# Patient Record
Sex: Female | Born: 1940 | Race: Black or African American | Hispanic: No | Smoking: Former smoker
Health system: Southern US, Community
[De-identification: ages and names within clinical notes are randomized; demographics above are authoritative.]

## PROBLEM LIST (undated history)

## (undated) DIAGNOSIS — K219 Gastro-esophageal reflux disease without esophagitis: Secondary | ICD-10-CM

## (undated) DIAGNOSIS — M1990 Unspecified osteoarthritis, unspecified site: Secondary | ICD-10-CM

## (undated) DIAGNOSIS — I10 Essential (primary) hypertension: Secondary | ICD-10-CM

## (undated) DIAGNOSIS — A048 Other specified bacterial intestinal infections: Secondary | ICD-10-CM

## (undated) DIAGNOSIS — M8580 Other specified disorders of bone density and structure, unspecified site: Secondary | ICD-10-CM

## (undated) DIAGNOSIS — E785 Hyperlipidemia, unspecified: Secondary | ICD-10-CM

## (undated) DIAGNOSIS — K56609 Unspecified intestinal obstruction, unspecified as to partial versus complete obstruction: Secondary | ICD-10-CM

## (undated) DIAGNOSIS — E079 Disorder of thyroid, unspecified: Secondary | ICD-10-CM

## (undated) DIAGNOSIS — H269 Unspecified cataract: Secondary | ICD-10-CM

## (undated) DIAGNOSIS — Z923 Personal history of irradiation: Secondary | ICD-10-CM

## (undated) DIAGNOSIS — Z9109 Other allergy status, other than to drugs and biological substances: Secondary | ICD-10-CM

## (undated) DIAGNOSIS — L509 Urticaria, unspecified: Secondary | ICD-10-CM

## (undated) DIAGNOSIS — D509 Iron deficiency anemia, unspecified: Secondary | ICD-10-CM

## (undated) DIAGNOSIS — T7840XA Allergy, unspecified, initial encounter: Secondary | ICD-10-CM

## (undated) DIAGNOSIS — C801 Malignant (primary) neoplasm, unspecified: Secondary | ICD-10-CM

## (undated) DIAGNOSIS — H409 Unspecified glaucoma: Secondary | ICD-10-CM

## (undated) HISTORY — DX: Unspecified cataract: H26.9

## (undated) HISTORY — PX: EYE SURGERY: SHX253

## (undated) HISTORY — DX: Allergy, unspecified, initial encounter: T78.40XA

## (undated) HISTORY — DX: Unspecified osteoarthritis, unspecified site: M19.90

## (undated) HISTORY — DX: Iron deficiency anemia, unspecified: D50.9

## (undated) HISTORY — DX: Other specified disorders of bone density and structure, unspecified site: M85.80

## (undated) HISTORY — DX: Disorder of thyroid, unspecified: E07.9

## (undated) HISTORY — DX: Urticaria, unspecified: L50.9

## (undated) HISTORY — PX: SMALL INTESTINE SURGERY: SHX150

## (undated) HISTORY — PX: NASAL SINUS SURGERY: SHX719

## (undated) HISTORY — PX: KNEE SURGERY: SHX244

## (undated) HISTORY — PX: COLON SURGERY: SHX602

## (undated) HISTORY — DX: Unspecified glaucoma: H40.9

## (undated) HISTORY — PX: BREAST BIOPSY: SHX20

## (undated) HISTORY — PX: ABDOMINAL HYSTERECTOMY: SHX81

## (undated) HISTORY — PX: THYROID SURGERY: SHX805

---
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
## 1997-08-23 ENCOUNTER — Emergency Department (HOSPITAL_COMMUNITY): Admission: EM | Admit: 1997-08-23 | Discharge: 1997-08-23 | Payer: Self-pay | Admitting: Emergency Medicine

## 1997-09-29 ENCOUNTER — Other Ambulatory Visit: Admission: RE | Admit: 1997-09-29 | Discharge: 1997-09-29 | Payer: Self-pay | Admitting: Obstetrics & Gynecology

## 1997-10-21 ENCOUNTER — Other Ambulatory Visit: Admission: RE | Admit: 1997-10-21 | Discharge: 1997-10-21 | Payer: Self-pay | Admitting: Internal Medicine

## 1997-12-15 ENCOUNTER — Other Ambulatory Visit: Admission: RE | Admit: 1997-12-15 | Discharge: 1997-12-15 | Payer: Self-pay | Admitting: Internal Medicine

## 1998-01-06 ENCOUNTER — Ambulatory Visit (HOSPITAL_COMMUNITY): Admission: RE | Admit: 1998-01-06 | Discharge: 1998-01-06 | Payer: Self-pay | Admitting: Internal Medicine

## 1998-02-08 ENCOUNTER — Encounter (HOSPITAL_BASED_OUTPATIENT_CLINIC_OR_DEPARTMENT_OTHER): Payer: Self-pay | Admitting: General Surgery

## 1998-02-10 ENCOUNTER — Ambulatory Visit (HOSPITAL_COMMUNITY): Admission: RE | Admit: 1998-02-10 | Discharge: 1998-02-11 | Payer: Self-pay | Admitting: General Surgery

## 1998-02-19 ENCOUNTER — Emergency Department (HOSPITAL_COMMUNITY): Admission: EM | Admit: 1998-02-19 | Discharge: 1998-02-19 | Payer: Self-pay | Admitting: Emergency Medicine

## 1998-03-10 ENCOUNTER — Other Ambulatory Visit: Admission: RE | Admit: 1998-03-10 | Discharge: 1998-03-10 | Payer: Self-pay | Admitting: Obstetrics & Gynecology

## 1998-03-23 ENCOUNTER — Encounter: Payer: Self-pay | Admitting: Obstetrics & Gynecology

## 1998-03-23 ENCOUNTER — Ambulatory Visit (HOSPITAL_COMMUNITY): Admission: RE | Admit: 1998-03-23 | Discharge: 1998-03-23 | Payer: Self-pay | Admitting: Obstetrics & Gynecology

## 1999-03-17 ENCOUNTER — Other Ambulatory Visit: Admission: RE | Admit: 1999-03-17 | Discharge: 1999-03-17 | Payer: Self-pay | Admitting: Obstetrics & Gynecology

## 1999-04-06 ENCOUNTER — Ambulatory Visit (HOSPITAL_COMMUNITY): Admission: RE | Admit: 1999-04-06 | Discharge: 1999-04-06 | Payer: Self-pay | Admitting: Obstetrics & Gynecology

## 1999-04-06 ENCOUNTER — Encounter: Payer: Self-pay | Admitting: Obstetrics & Gynecology

## 1999-07-19 ENCOUNTER — Ambulatory Visit (HOSPITAL_COMMUNITY): Admission: RE | Admit: 1999-07-19 | Discharge: 1999-07-19 | Payer: Self-pay | Admitting: Internal Medicine

## 2000-04-26 ENCOUNTER — Other Ambulatory Visit: Admission: RE | Admit: 2000-04-26 | Discharge: 2000-04-26 | Payer: Self-pay | Admitting: Obstetrics and Gynecology

## 2000-05-02 ENCOUNTER — Ambulatory Visit (HOSPITAL_COMMUNITY): Admission: RE | Admit: 2000-05-02 | Discharge: 2000-05-02 | Payer: Self-pay | Admitting: Obstetrics and Gynecology

## 2000-05-02 ENCOUNTER — Encounter: Payer: Self-pay | Admitting: Internal Medicine

## 2000-06-25 ENCOUNTER — Ambulatory Visit (HOSPITAL_COMMUNITY): Admission: RE | Admit: 2000-06-25 | Discharge: 2000-06-25 | Payer: Self-pay | Admitting: Internal Medicine

## 2000-06-25 ENCOUNTER — Encounter: Payer: Self-pay | Admitting: Internal Medicine

## 2001-04-30 ENCOUNTER — Other Ambulatory Visit: Admission: RE | Admit: 2001-04-30 | Discharge: 2001-04-30 | Payer: Self-pay | Admitting: Obstetrics and Gynecology

## 2001-05-05 ENCOUNTER — Encounter: Payer: Self-pay | Admitting: Obstetrics and Gynecology

## 2001-05-05 ENCOUNTER — Ambulatory Visit (HOSPITAL_COMMUNITY): Admission: RE | Admit: 2001-05-05 | Discharge: 2001-05-05 | Payer: Self-pay | Admitting: Obstetrics and Gynecology

## 2001-07-04 ENCOUNTER — Emergency Department (HOSPITAL_COMMUNITY): Admission: EM | Admit: 2001-07-04 | Discharge: 2001-07-04 | Payer: Self-pay | Admitting: Emergency Medicine

## 2001-08-06 ENCOUNTER — Encounter (INDEPENDENT_AMBULATORY_CARE_PROVIDER_SITE_OTHER): Payer: Self-pay

## 2001-08-06 ENCOUNTER — Observation Stay (HOSPITAL_COMMUNITY): Admission: RE | Admit: 2001-08-06 | Discharge: 2001-08-07 | Payer: Self-pay | Admitting: Obstetrics and Gynecology

## 2001-09-17 ENCOUNTER — Ambulatory Visit (HOSPITAL_COMMUNITY): Admission: RE | Admit: 2001-09-17 | Discharge: 2001-09-17 | Payer: Self-pay | Admitting: Obstetrics and Gynecology

## 2001-09-17 ENCOUNTER — Encounter: Payer: Self-pay | Admitting: Obstetrics and Gynecology

## 2002-03-03 ENCOUNTER — Encounter: Payer: Self-pay | Admitting: Internal Medicine

## 2002-03-03 ENCOUNTER — Ambulatory Visit (HOSPITAL_COMMUNITY): Admission: RE | Admit: 2002-03-03 | Discharge: 2002-03-03 | Payer: Self-pay | Admitting: Internal Medicine

## 2002-04-23 ENCOUNTER — Ambulatory Visit (HOSPITAL_COMMUNITY): Admission: RE | Admit: 2002-04-23 | Discharge: 2002-04-23 | Payer: Self-pay | Admitting: Internal Medicine

## 2002-05-06 ENCOUNTER — Encounter: Payer: Self-pay | Admitting: Obstetrics and Gynecology

## 2002-05-06 ENCOUNTER — Ambulatory Visit (HOSPITAL_COMMUNITY): Admission: RE | Admit: 2002-05-06 | Discharge: 2002-05-06 | Payer: Self-pay | Admitting: Obstetrics and Gynecology

## 2002-05-22 ENCOUNTER — Emergency Department (HOSPITAL_COMMUNITY): Admission: EM | Admit: 2002-05-22 | Discharge: 2002-05-22 | Payer: Self-pay | Admitting: Emergency Medicine

## 2002-06-11 ENCOUNTER — Other Ambulatory Visit: Admission: RE | Admit: 2002-06-11 | Discharge: 2002-06-11 | Payer: Self-pay | Admitting: Obstetrics and Gynecology

## 2002-07-14 ENCOUNTER — Emergency Department (HOSPITAL_COMMUNITY): Admission: EM | Admit: 2002-07-14 | Discharge: 2002-07-14 | Payer: Self-pay | Admitting: Emergency Medicine

## 2002-07-14 ENCOUNTER — Encounter: Payer: Self-pay | Admitting: Emergency Medicine

## 2003-05-17 ENCOUNTER — Ambulatory Visit (HOSPITAL_COMMUNITY): Admission: RE | Admit: 2003-05-17 | Discharge: 2003-05-17 | Payer: Self-pay | Admitting: Internal Medicine

## 2003-05-26 ENCOUNTER — Emergency Department (HOSPITAL_COMMUNITY): Admission: EM | Admit: 2003-05-26 | Discharge: 2003-05-26 | Payer: Self-pay | Admitting: Emergency Medicine

## 2003-06-02 ENCOUNTER — Emergency Department (HOSPITAL_COMMUNITY): Admission: EM | Admit: 2003-06-02 | Discharge: 2003-06-02 | Payer: Self-pay | Admitting: Emergency Medicine

## 2004-05-17 ENCOUNTER — Ambulatory Visit (HOSPITAL_COMMUNITY): Admission: RE | Admit: 2004-05-17 | Discharge: 2004-05-17 | Payer: Self-pay | Admitting: Obstetrics and Gynecology

## 2004-06-30 ENCOUNTER — Emergency Department (HOSPITAL_COMMUNITY): Admission: EM | Admit: 2004-06-30 | Discharge: 2004-06-30 | Payer: Self-pay | Admitting: Emergency Medicine

## 2004-06-30 IMAGING — CR DG CHEST 2V
2 series · 2 of 2 positions shown · non-contrast
Comparison: none

CLINICAL DATA: Cough and cold symptoms.
 CHEST-TWO VIEW:
 No comparison.  The cardiomediastinal contours are normal.  The lungs are clear.  There is no pleural effusion or pneumothorax.  Osseous structures appear unremarkable.

[view not recorded (1 of 2)]
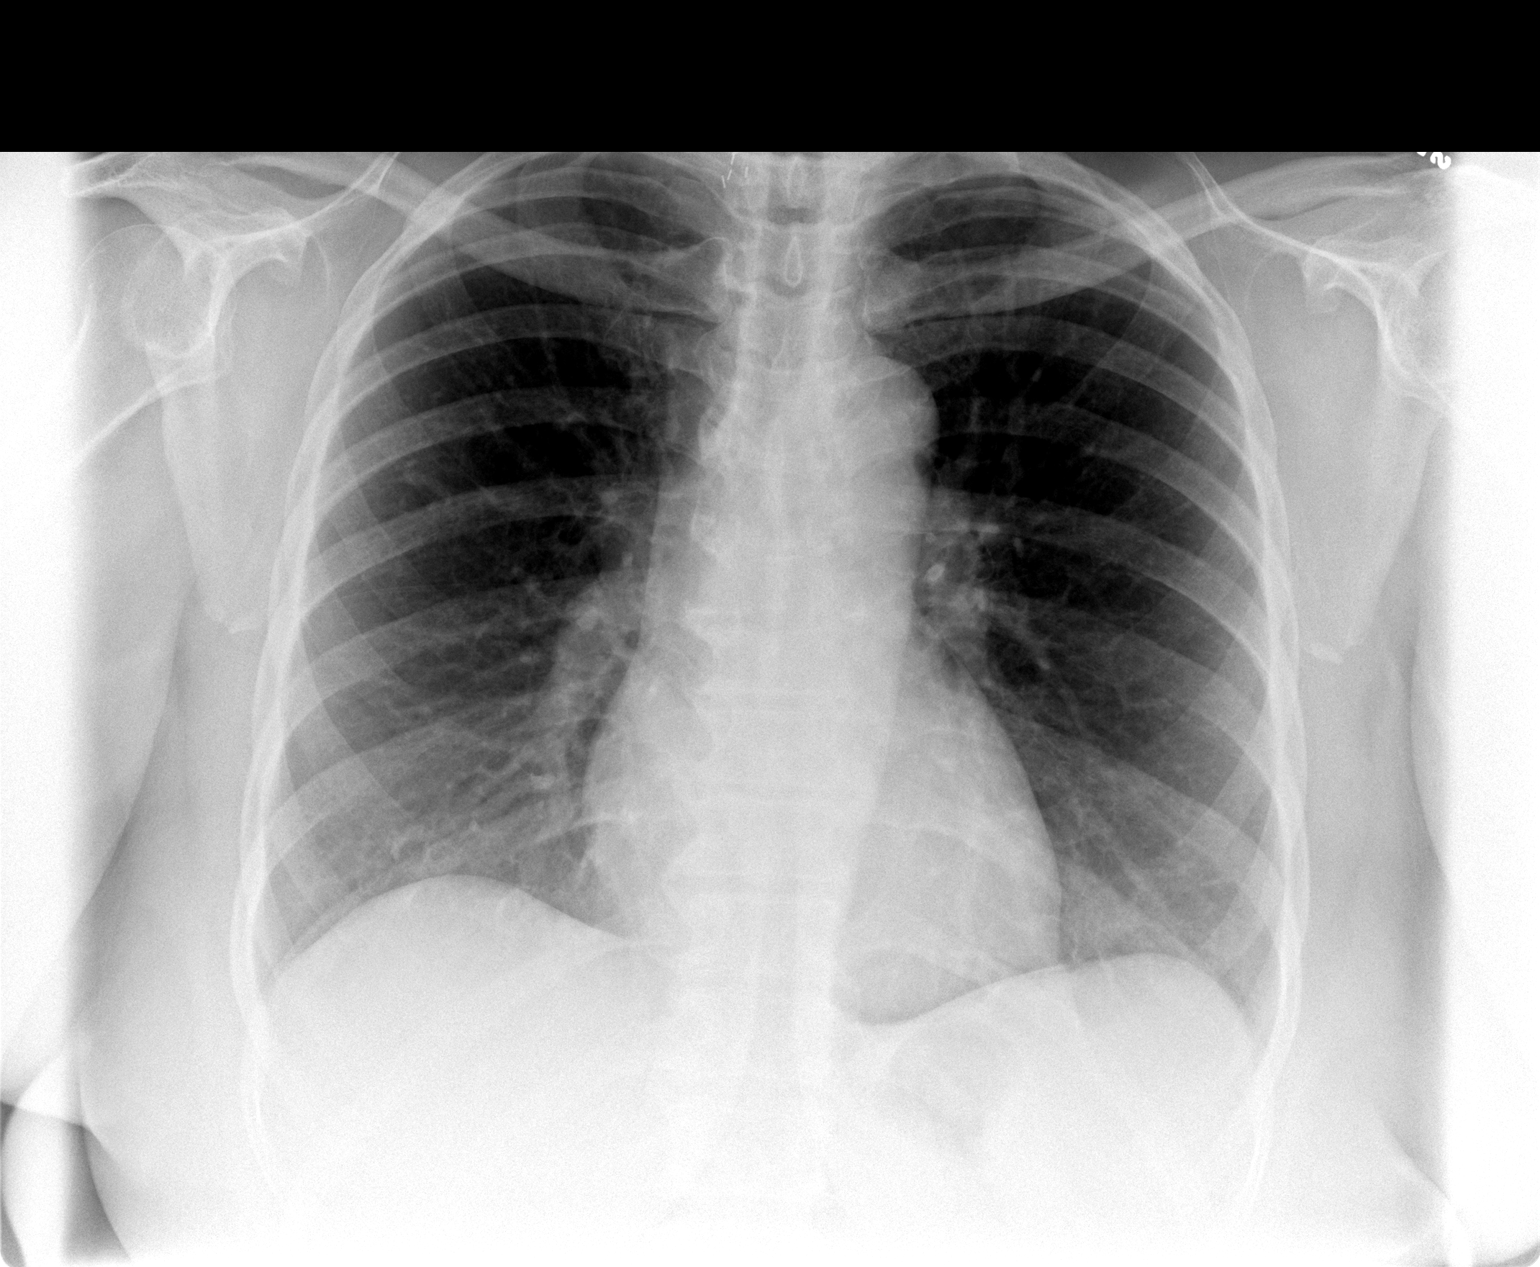

[view not recorded (2 of 2)]
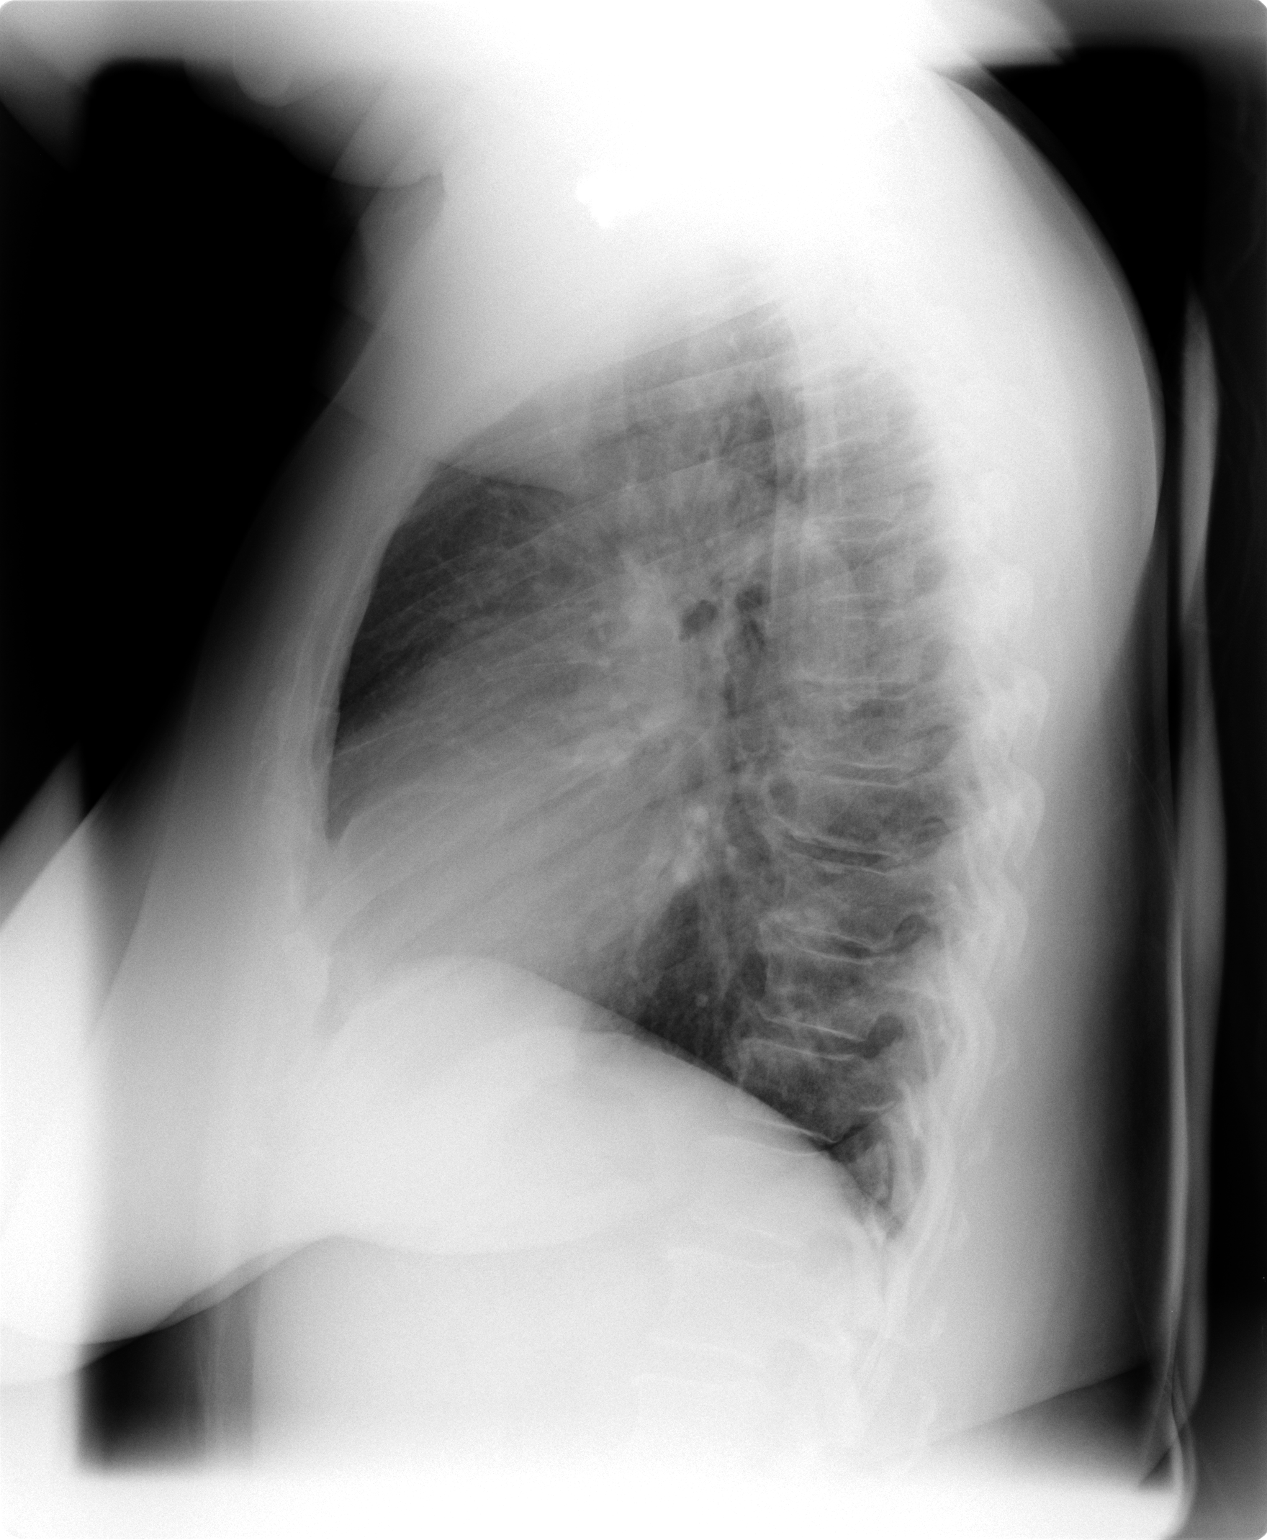

[2 of 2 positions shown; findings below may reference images not displayed]

IMPRESSION: No active cardiopulmonary process demonstrated.

## 2004-08-30 ENCOUNTER — Inpatient Hospital Stay (HOSPITAL_COMMUNITY): Admission: AD | Admit: 2004-08-30 | Discharge: 2004-08-30 | Payer: Self-pay | Admitting: Internal Medicine

## 2004-08-30 IMAGING — CR DG CHEST 2V
2 series · 2 of 2 positions shown · non-contrast
Comparison: Previous exam on [DATE].

CLINICAL DATA: Persistent cough with congestion for three weeks.
 CHEST - 2 VIEW:

[view not recorded (1 of 2)]
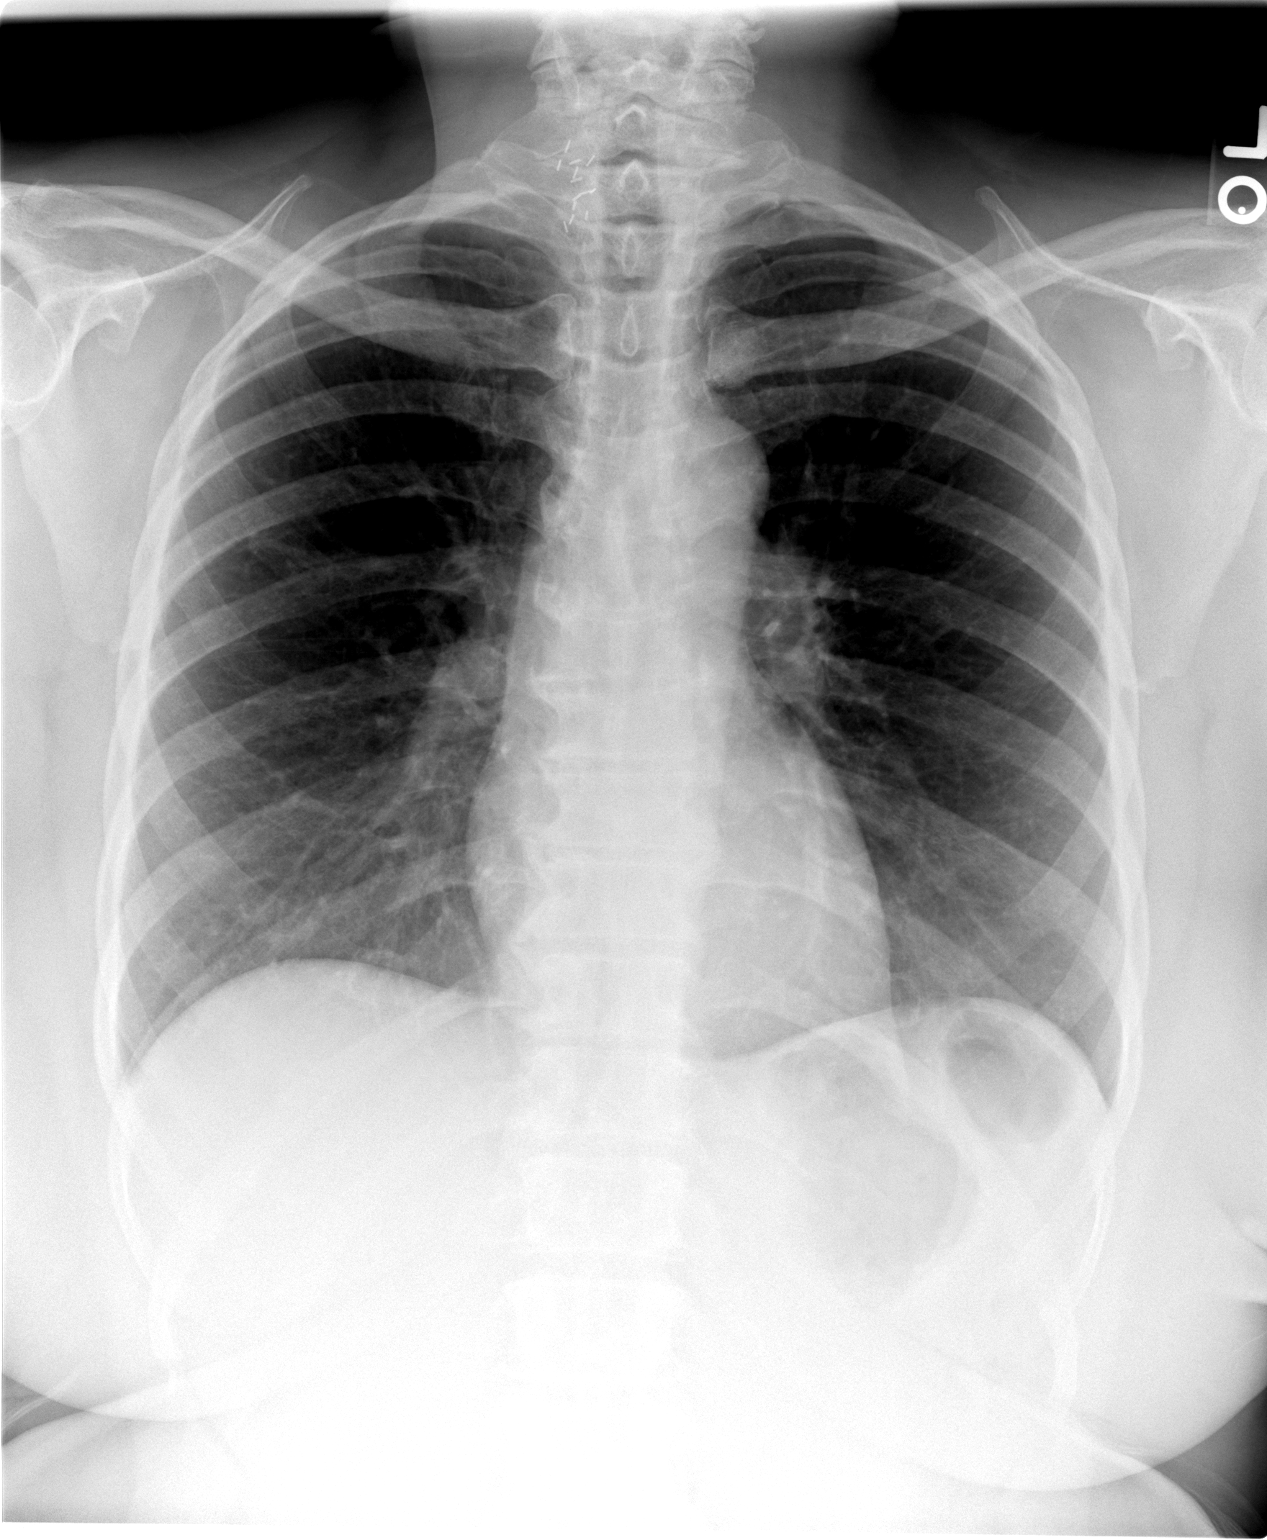

[view not recorded (2 of 2)]
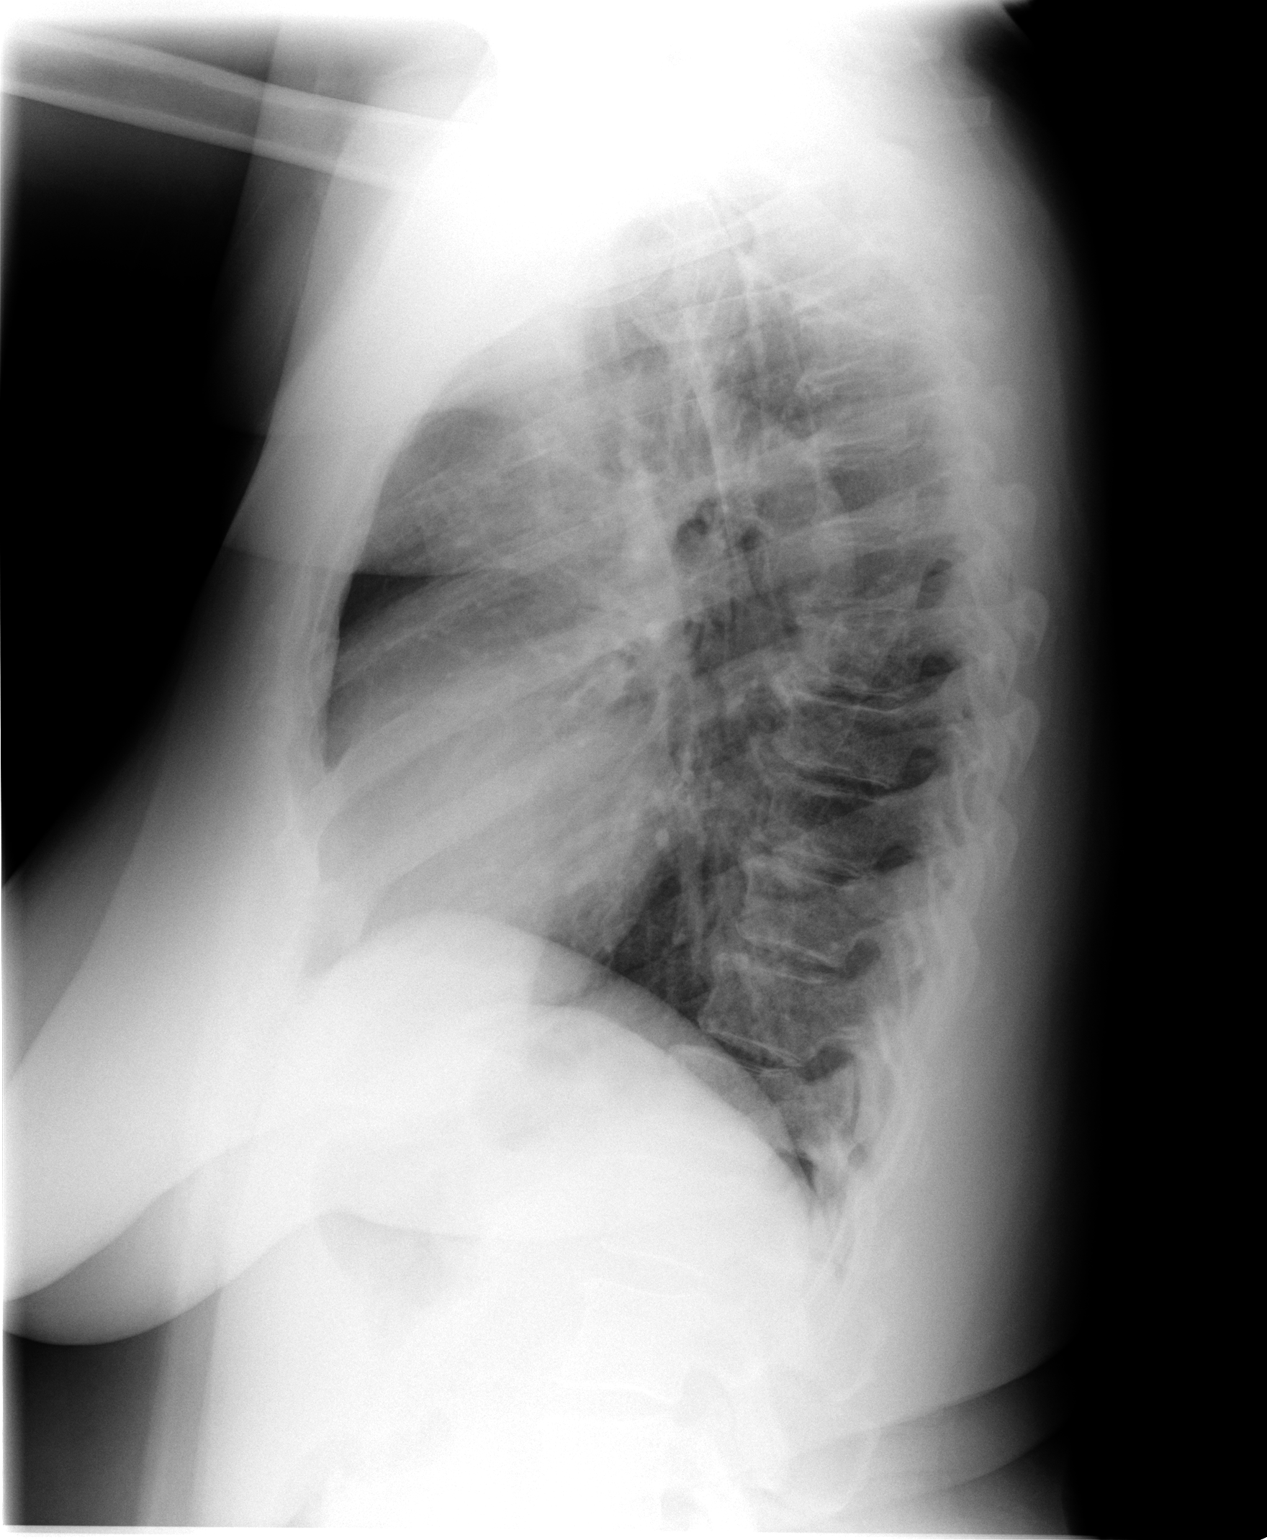

[2 of 2 positions shown; findings below may reference images not displayed]

Heart and mediastinal contours are within normal limits.  The lung fields are clear with no evidence for focal infiltrate or congestive failure.  Bony structures demonstrate degenerative changes of the thoracic spine with anterior osteophytosis at multiple levels and this finding is stable.
IMPRESSION: No acute cardiopulmonary disease.

## 2005-02-12 ENCOUNTER — Emergency Department (HOSPITAL_COMMUNITY): Admission: EM | Admit: 2005-02-12 | Discharge: 2005-02-12 | Payer: Self-pay | Admitting: Emergency Medicine

## 2005-03-26 IMAGING — CR DG CHEST 2V
2 series · 2 of 2 positions shown · non-contrast
Comparison: [DATE].

CLINICAL DATA: Pansinusitis.  Hypertension.  Asthma.  Wheezing.  Cough.  Preadmission chest.
 CHEST - 2 VIEWS:

[view not recorded (1 of 2)]
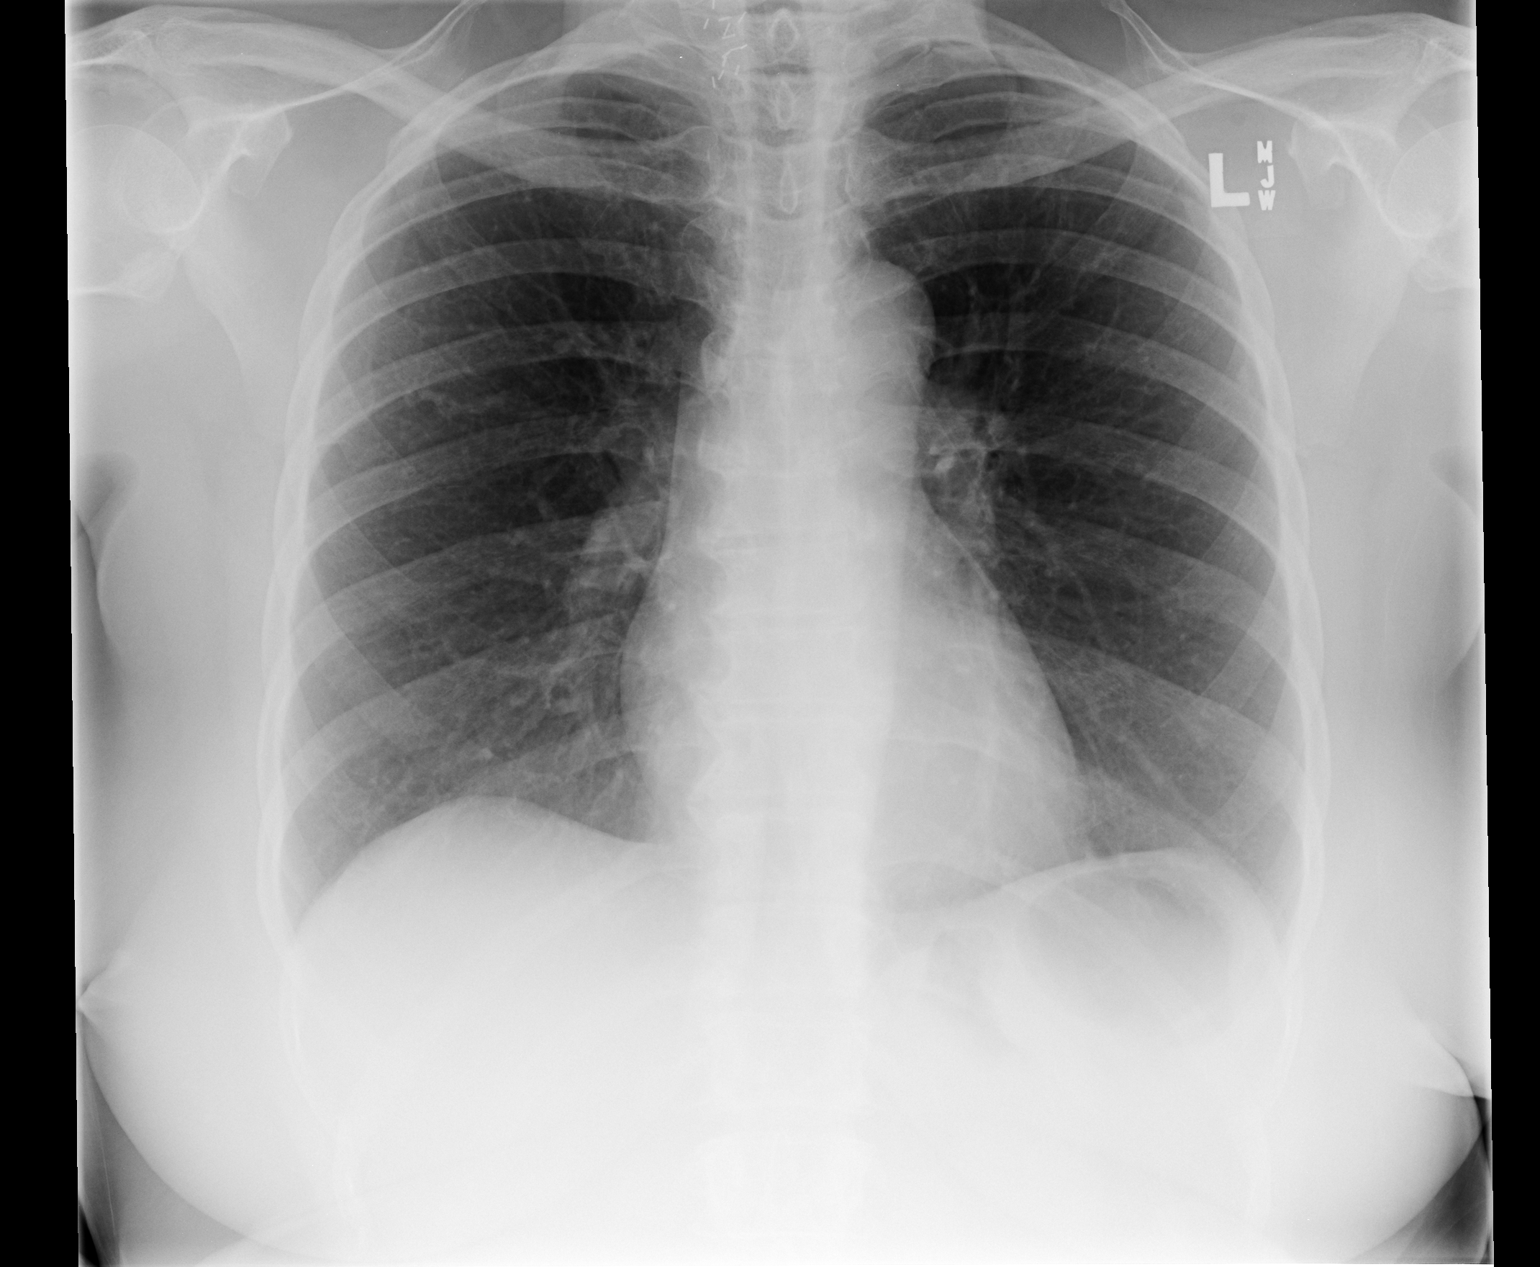

[view not recorded (2 of 2)]
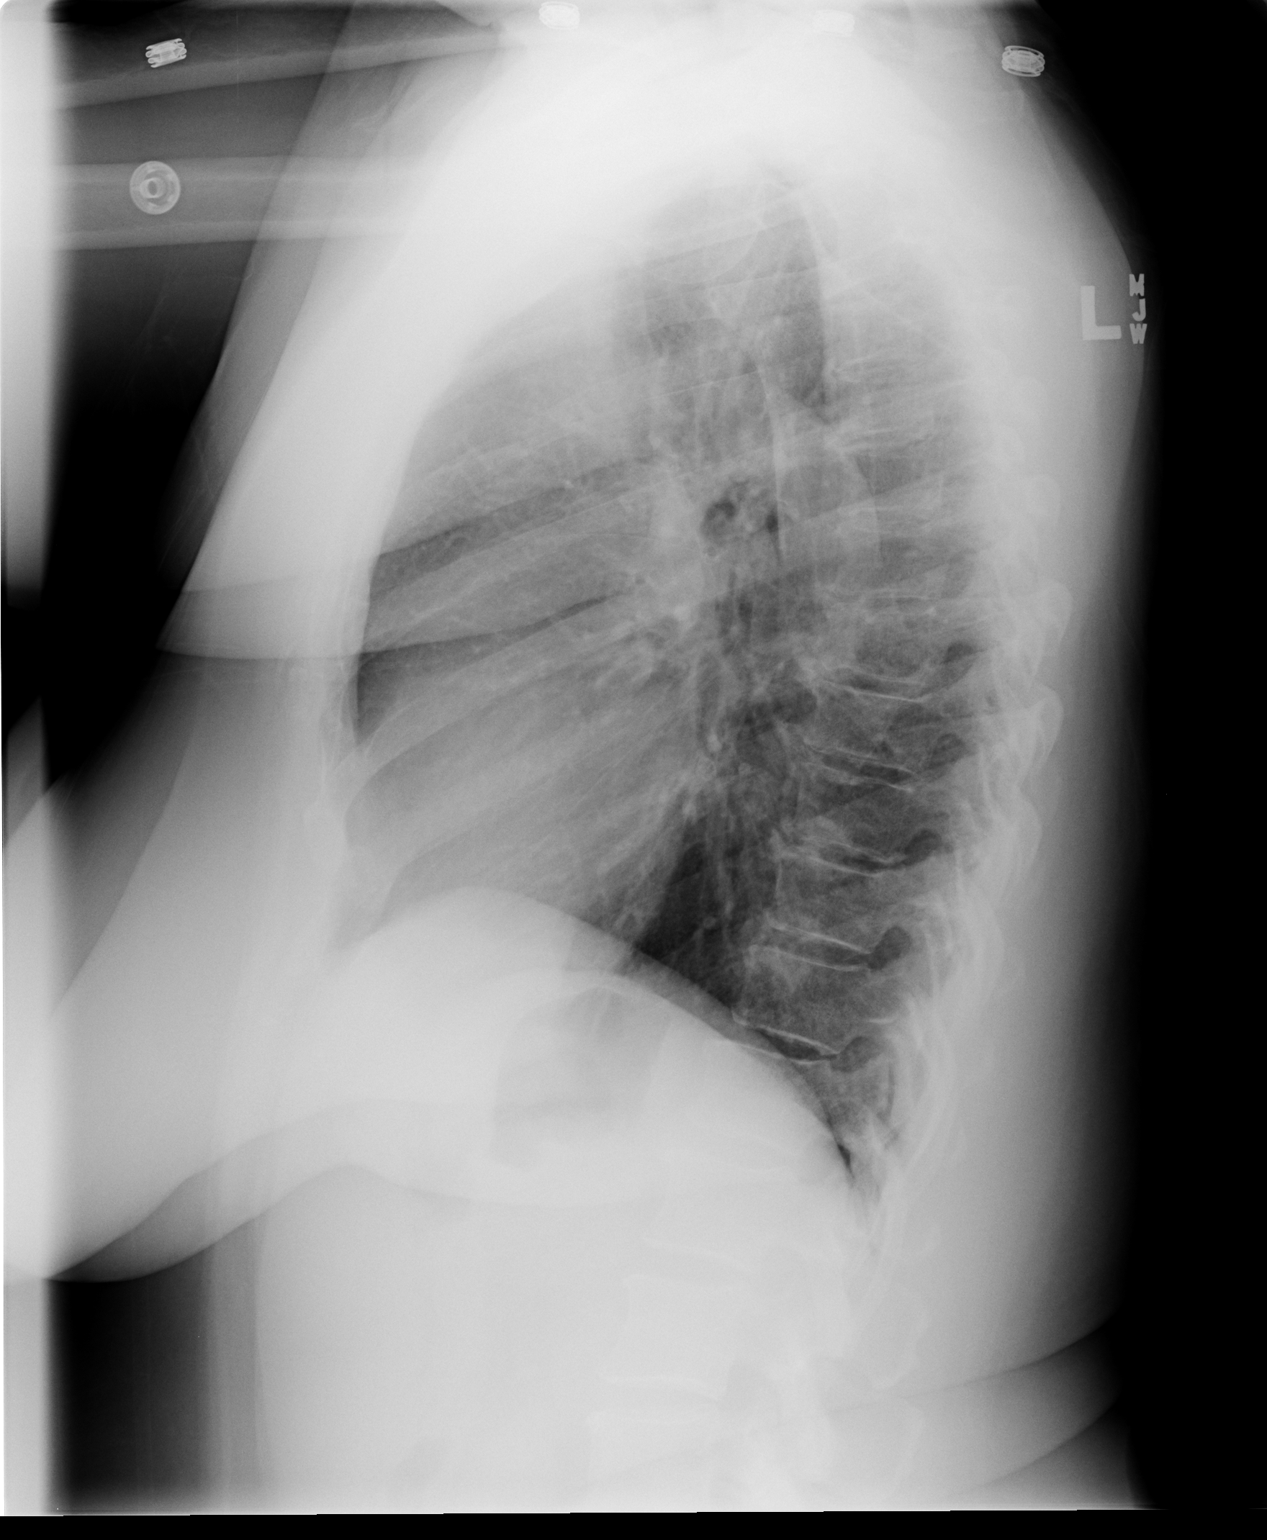

[2 of 2 positions shown; findings below may reference images not displayed]

FINDINGS: Diffuse accentuation of the peribronchial markings is compatible with chronic changes.  No active pulmonary process.  The cardiomediastinal silhouette is normal.  Symmetrical hila.  Cluster of metallic surgical clips in the right lower cervical region.
IMPRESSION: Chronic peribronchitic markings.  No active chest process.

## 2005-03-28 ENCOUNTER — Ambulatory Visit (HOSPITAL_COMMUNITY): Admission: RE | Admit: 2005-03-28 | Discharge: 2005-03-28 | Payer: Self-pay | Admitting: Otolaryngology

## 2005-03-28 ENCOUNTER — Encounter (INDEPENDENT_AMBULATORY_CARE_PROVIDER_SITE_OTHER): Payer: Self-pay | Admitting: Specialist

## 2005-05-25 ENCOUNTER — Ambulatory Visit (HOSPITAL_COMMUNITY): Admission: RE | Admit: 2005-05-25 | Discharge: 2005-05-25 | Payer: Self-pay | Admitting: Internal Medicine

## 2005-12-17 ENCOUNTER — Emergency Department (HOSPITAL_COMMUNITY): Admission: EM | Admit: 2005-12-17 | Discharge: 2005-12-17 | Payer: Self-pay | Admitting: Emergency Medicine

## 2005-12-17 IMAGING — CR DG ABDOMEN ACUTE W/ 1V CHEST
4 series · 4 of 4 positions shown · non-contrast
Comparison: [DATE]

CLINICAL DATA: Abdominal pain. Short of breath. Weakness.

[view not recorded (1 of 4)]
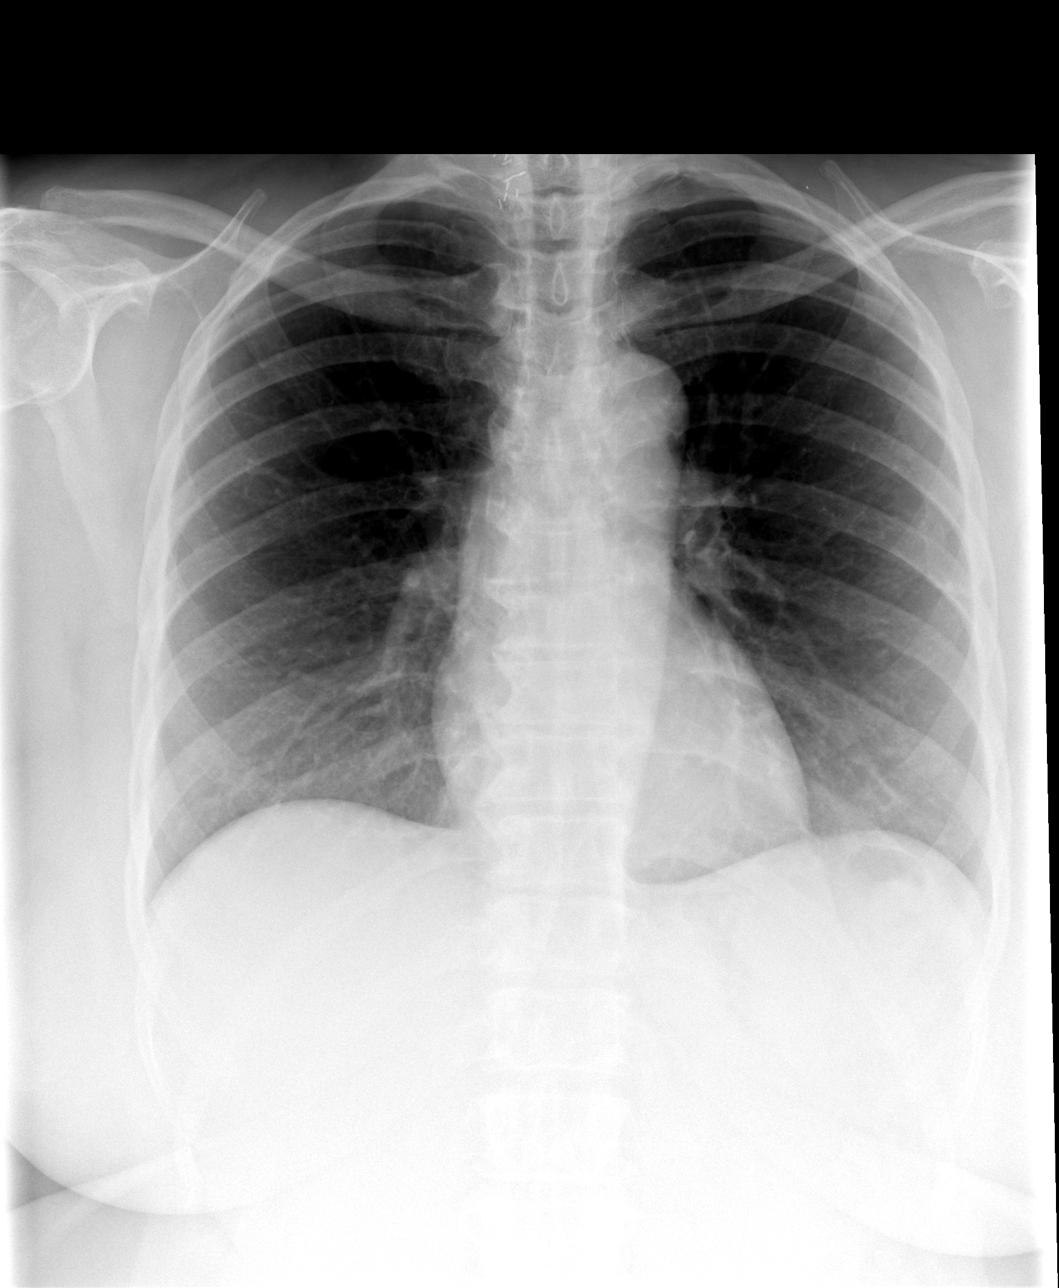

[view not recorded (2 of 4)]
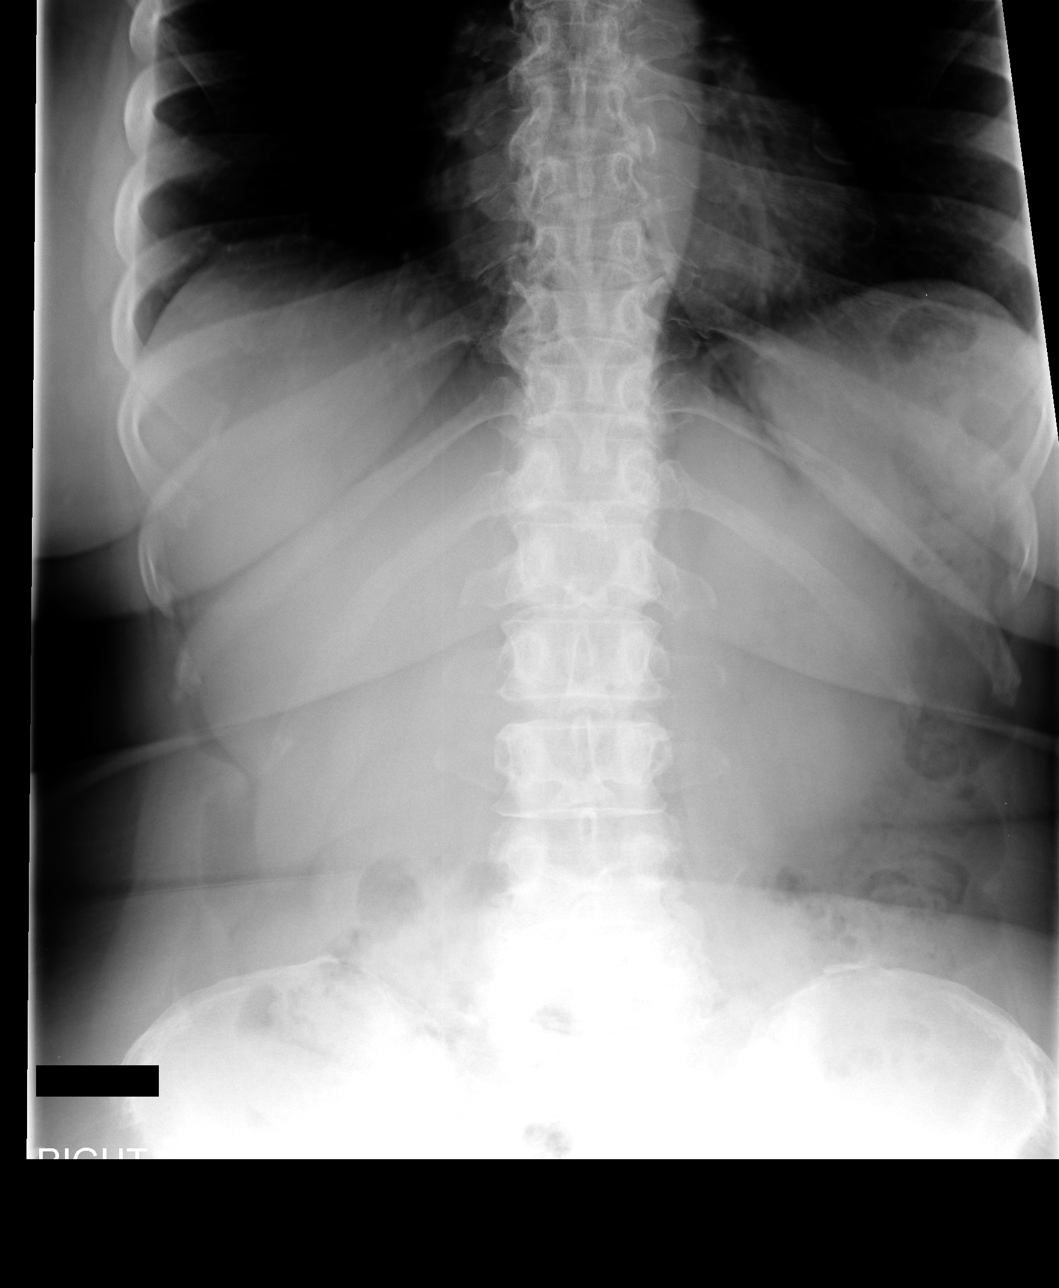

[view not recorded (3 of 4)]
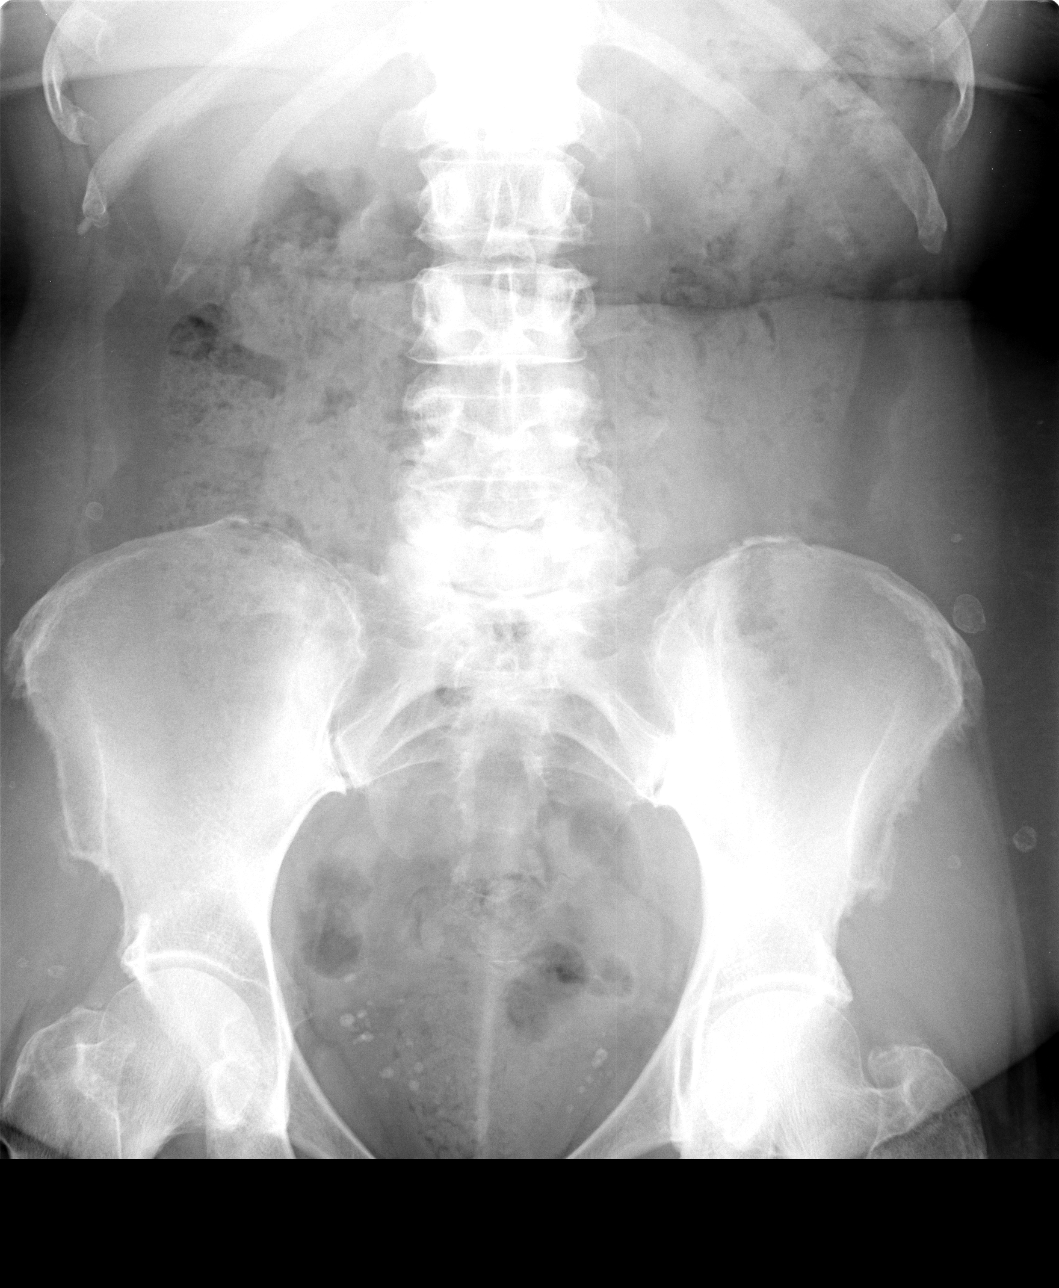

[view not recorded (4 of 4)]
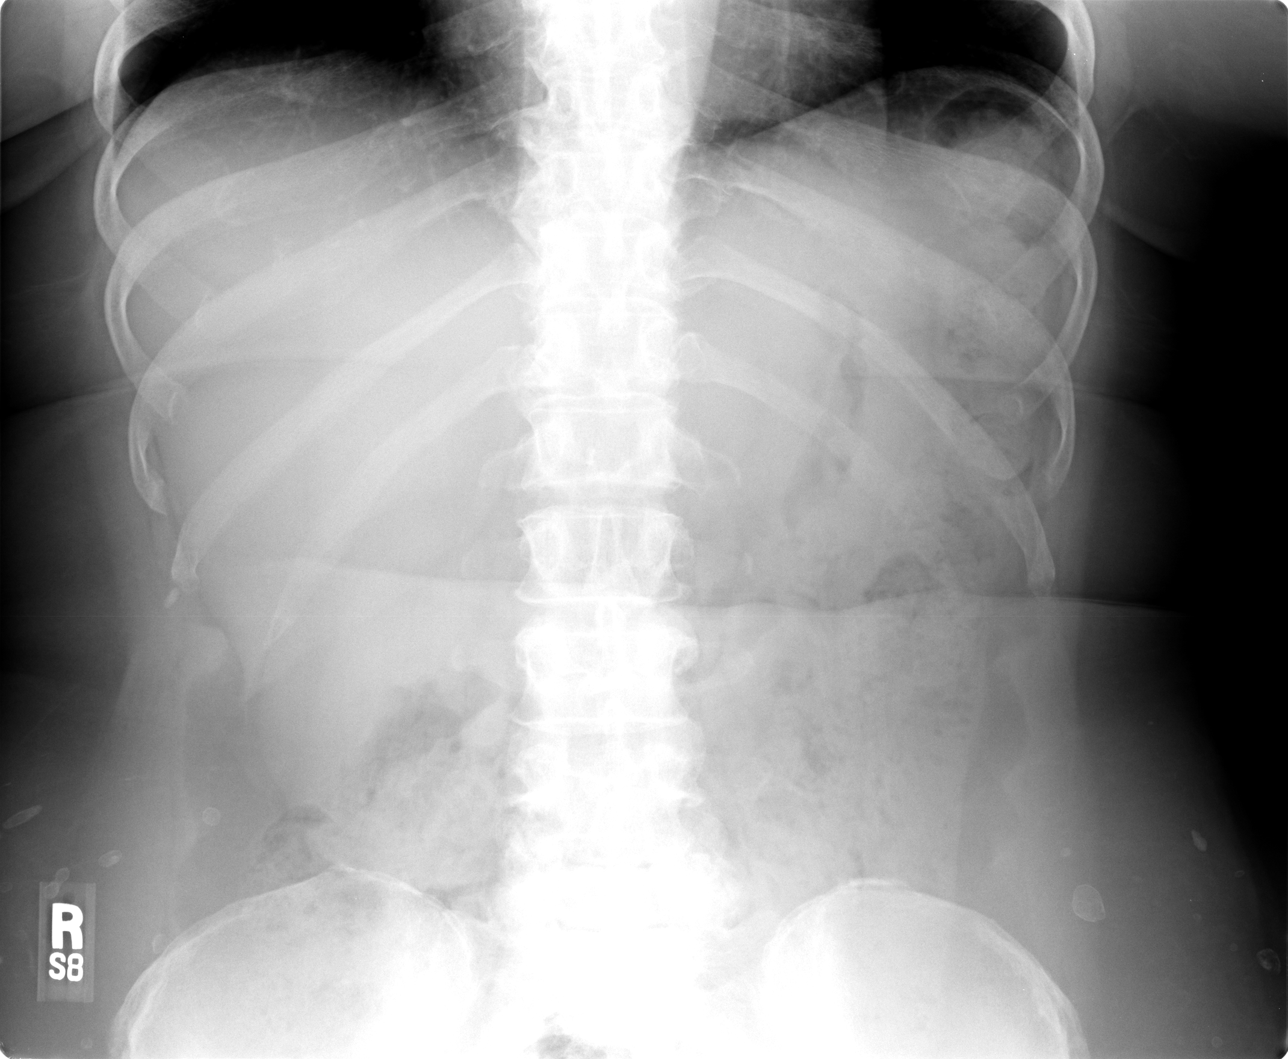

[4 of 4 positions shown; findings below may reference images not displayed]

ABDOMEN SERIES - 2 VIEW & CHEST - 1 VIEW:

Lungs clear. Heart size is within normal limits. Surgical clips in the right
neck suggest prior right thyroidectomy.

No intraperitoneal free air. Prominent stool noted along the course of the
colon. Degenerative changes characterize the lower lumbar spine.
IMPRESSION: No acute cardiopulmonary process.

Prominent stool raises the question of constipation.

## 2005-12-17 IMAGING — CT CT PELVIS W/ CM
2 of 5 series · 17 of 46 positions shown, 19 images · IV contrast (omnipaque)
Comparison: None.

CLINICAL DATA: Abdominal pain. 
 ABDOMEN CT WITH CONTRAST ? [DATE]:
TECHNIQUE: Multidetector CT imaging of the abdomen was performed following the standard protocol during bolus administration of intravenous contrast. 
 Contrast:  125 cc Omnipaque 300 IV.
TECHNIQUE: Multidetector CT imaging of the pelvis was performed following the standard protocol during bolus administration of intravenous contrast.

[Series 2: abd_pel 5.0 b40s st · axial · 0.63mm/px · z∈[-482,-88]mm · 14 of 89 slices shown, 16 images]
[im 5/89  soft-tissue]
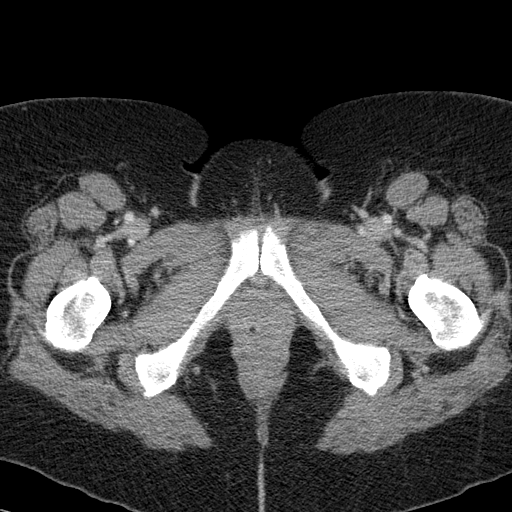
[im 5/89  bone]
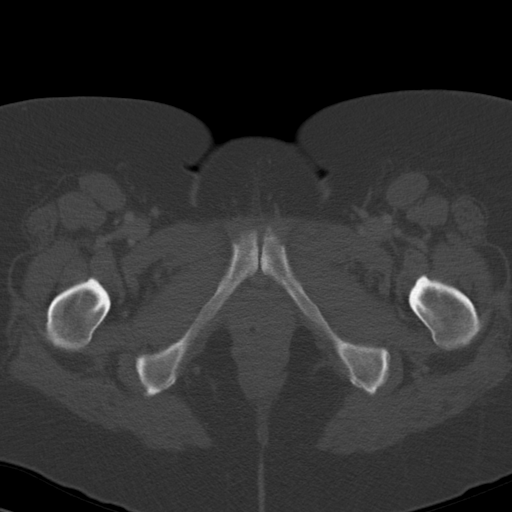
[im 14/89  soft-tissue]
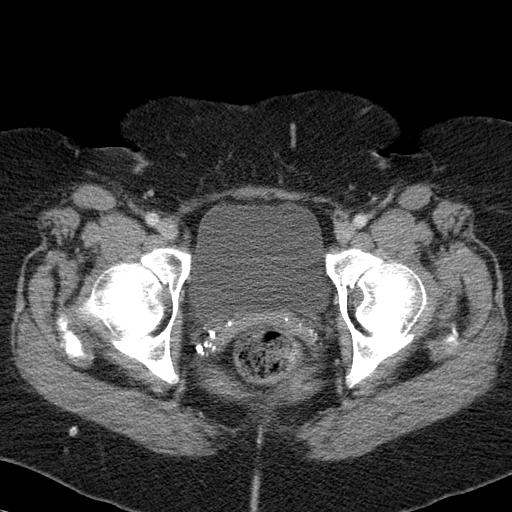
[im 18/89  soft-tissue]
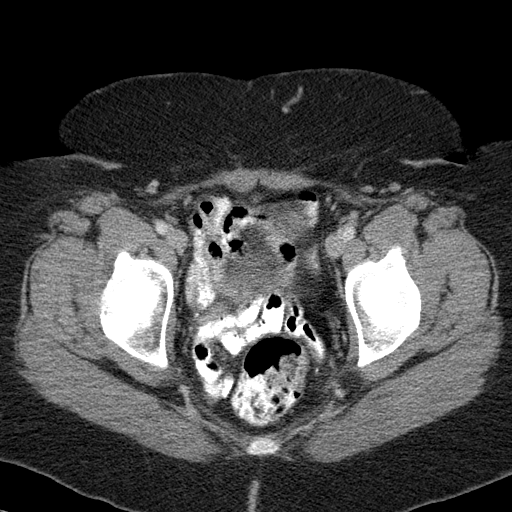
[im 23/89  soft-tissue]
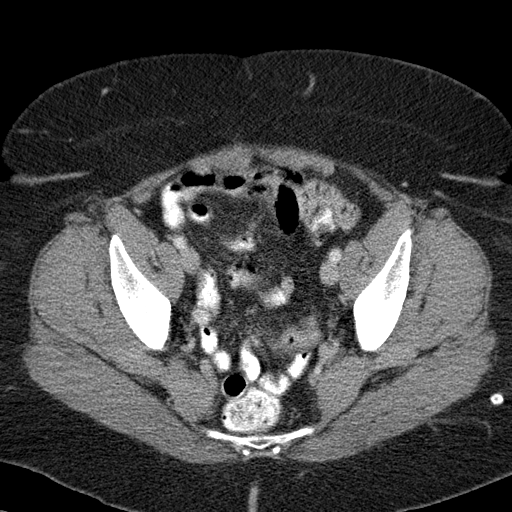
[im 31/89  soft-tissue]
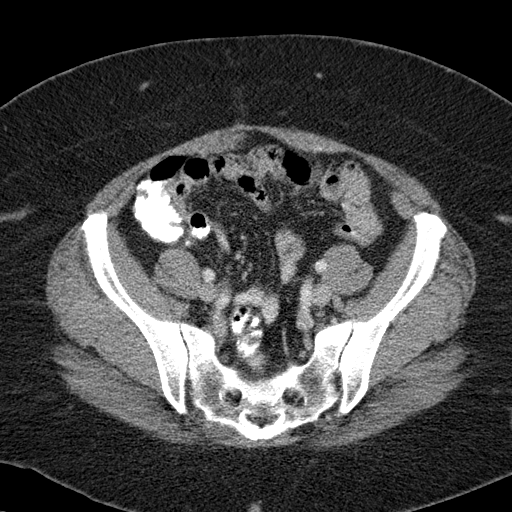
[im 36/89  soft-tissue]
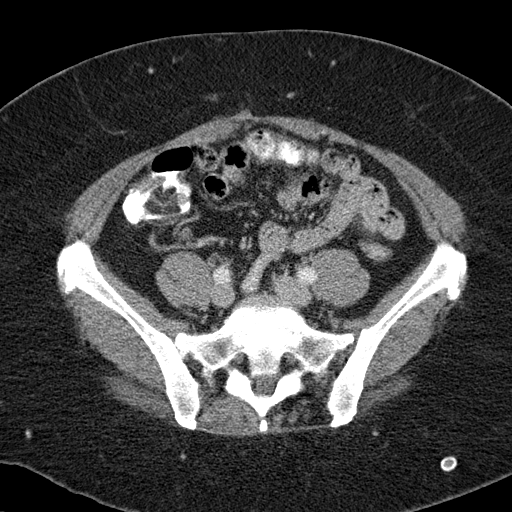
[im 40/89  soft-tissue]
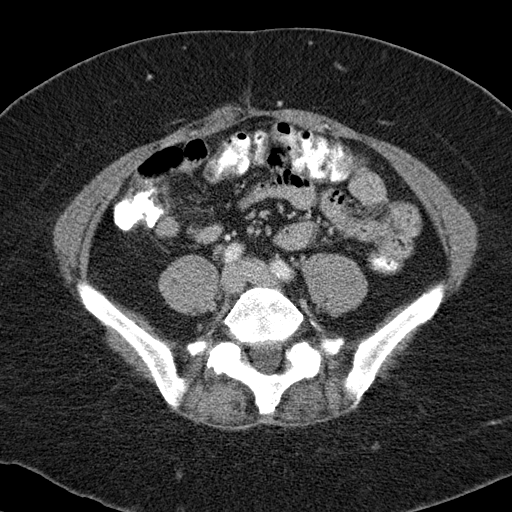
[im 49/89  soft-tissue]
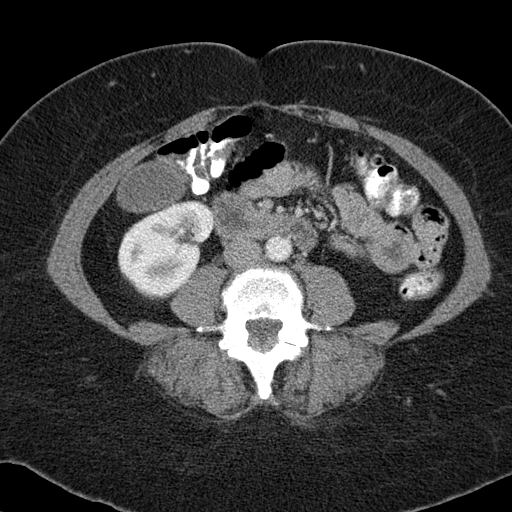
[im 53/89  soft-tissue]
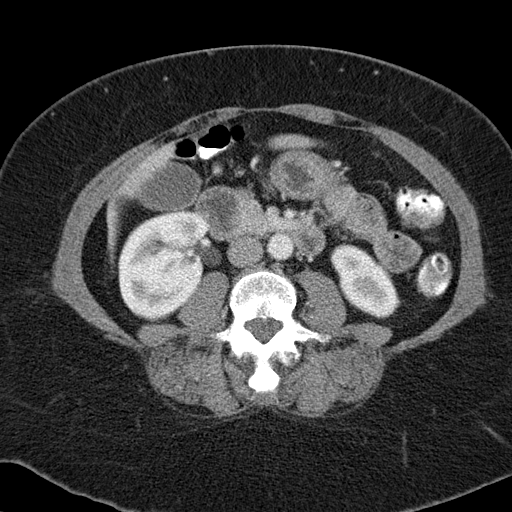
[im 53/89  bone]
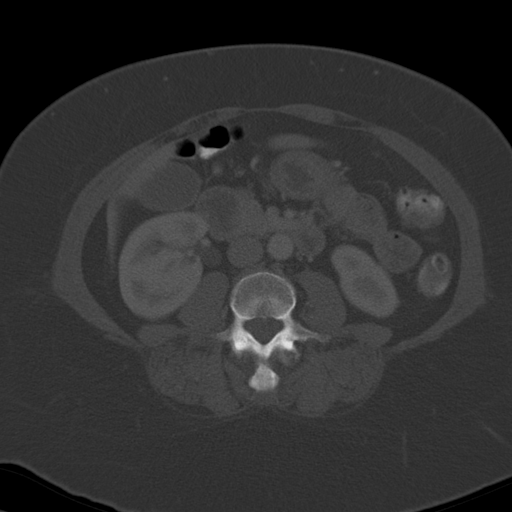
[im 58/89  soft-tissue]
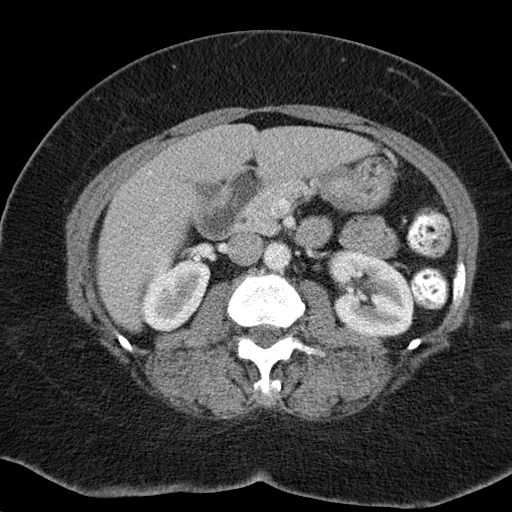
[im 67/89  soft-tissue]
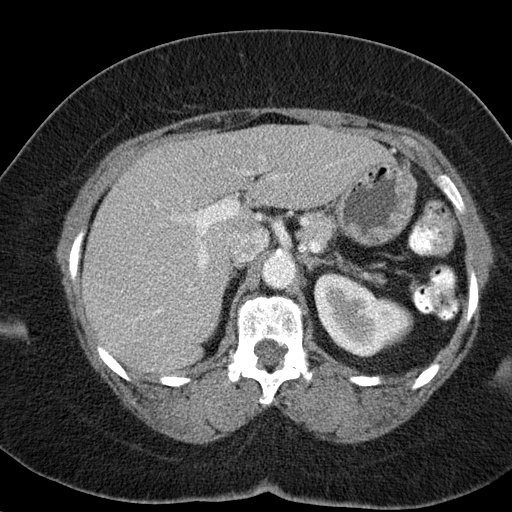
[im 71/89  soft-tissue]
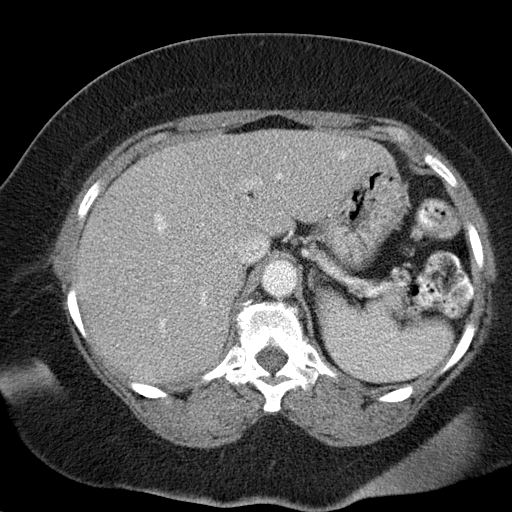
[im 75/89  soft-tissue]
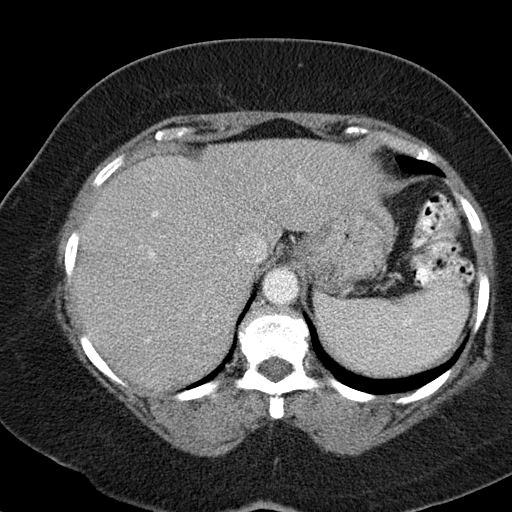
[im 84/89  soft-tissue]
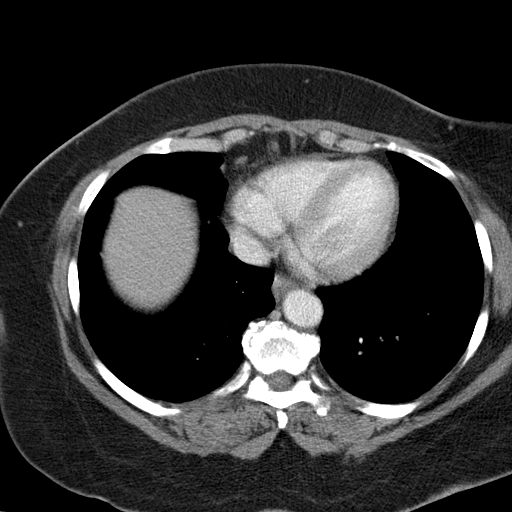

[Series 602: coronal · coronal · 0.89mm/px · 3 of 39 slices shown]
[im 13/39  soft-tissue]
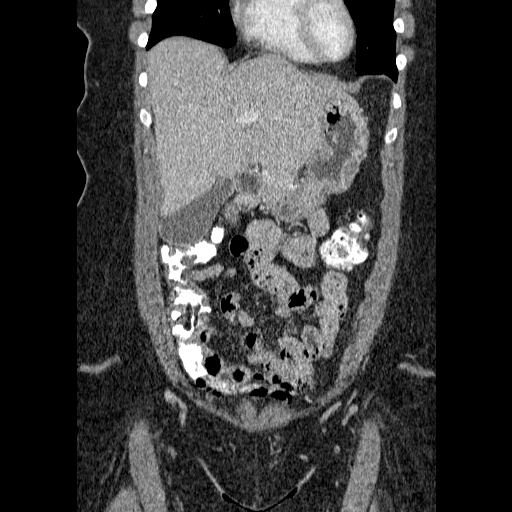
[im 17/39  soft-tissue]
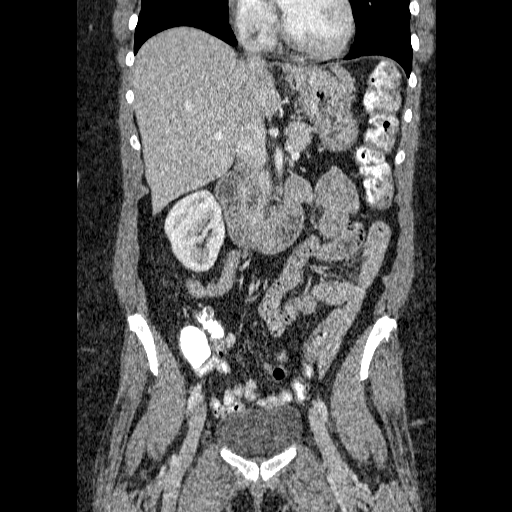
[im 22/39  soft-tissue]
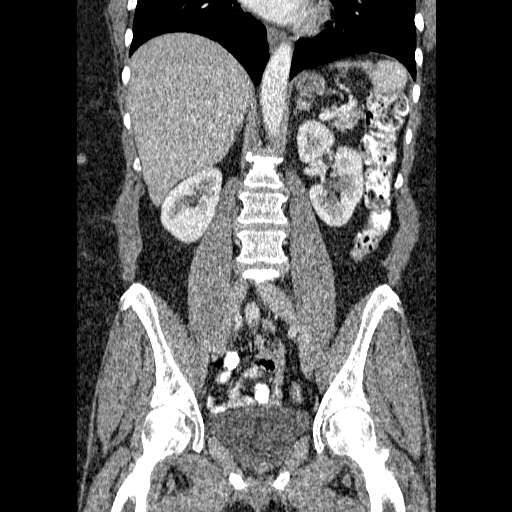

[17 of 46 positions shown; findings below may reference images not displayed]

FINDINGS: The liver, gallbladder, adrenal glands, kidneys, spleen, pancreas, stomach, and bowel are unremarkable.  No pathologically enlarged lymph nodes.  No free fluid.
IMPRESSION: No acute findings.    
 PELVIS CT WITH CONTRAST ? [DATE]:
FINDINGS: The uterus and ovaries are absent.  No free fluid.  No pathologically enlarged lymph nodes.
IMPRESSION: No acute findings.

## 2006-05-29 ENCOUNTER — Ambulatory Visit (HOSPITAL_COMMUNITY): Admission: RE | Admit: 2006-05-29 | Discharge: 2006-05-29 | Payer: Self-pay | Admitting: Internal Medicine

## 2007-06-02 ENCOUNTER — Ambulatory Visit (HOSPITAL_COMMUNITY): Admission: RE | Admit: 2007-06-02 | Discharge: 2007-06-02 | Payer: Self-pay | Admitting: Internal Medicine

## 2007-12-07 ENCOUNTER — Emergency Department (HOSPITAL_COMMUNITY): Admission: EM | Admit: 2007-12-07 | Discharge: 2007-12-07 | Payer: Self-pay | Admitting: Family Medicine

## 2008-06-02 ENCOUNTER — Ambulatory Visit (HOSPITAL_COMMUNITY): Admission: RE | Admit: 2008-06-02 | Discharge: 2008-06-02 | Payer: Self-pay | Admitting: Internal Medicine

## 2008-07-29 ENCOUNTER — Ambulatory Visit (HOSPITAL_COMMUNITY): Admission: RE | Admit: 2008-07-29 | Discharge: 2008-07-29 | Payer: Self-pay | Admitting: Internal Medicine

## 2008-08-18 IMAGING — CR DG CHEST 2V
2 series · 2 of 2 positions shown · non-contrast
Comparison: [DATE]

CLINICAL DATA: Pre admit for pan sinusitis

CHEST - 2 VIEW

[view not recorded (1 of 2)]
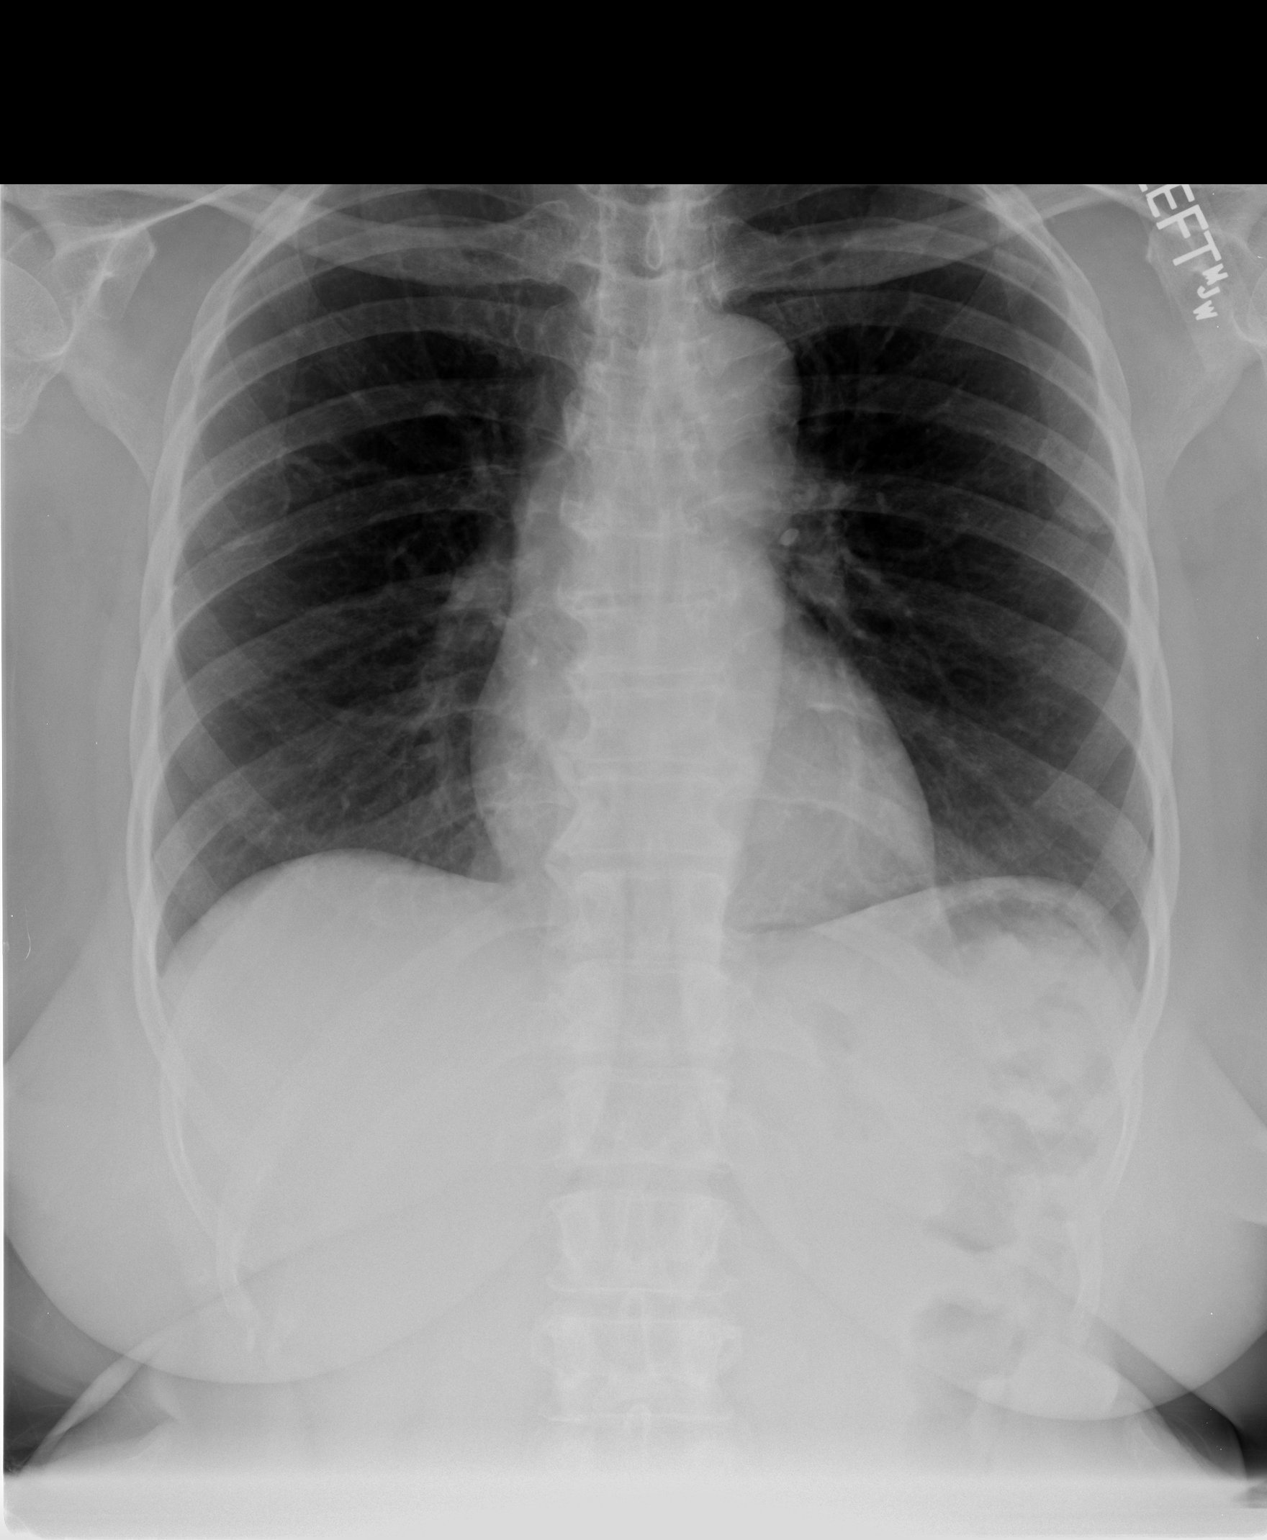

[view not recorded (2 of 2)]
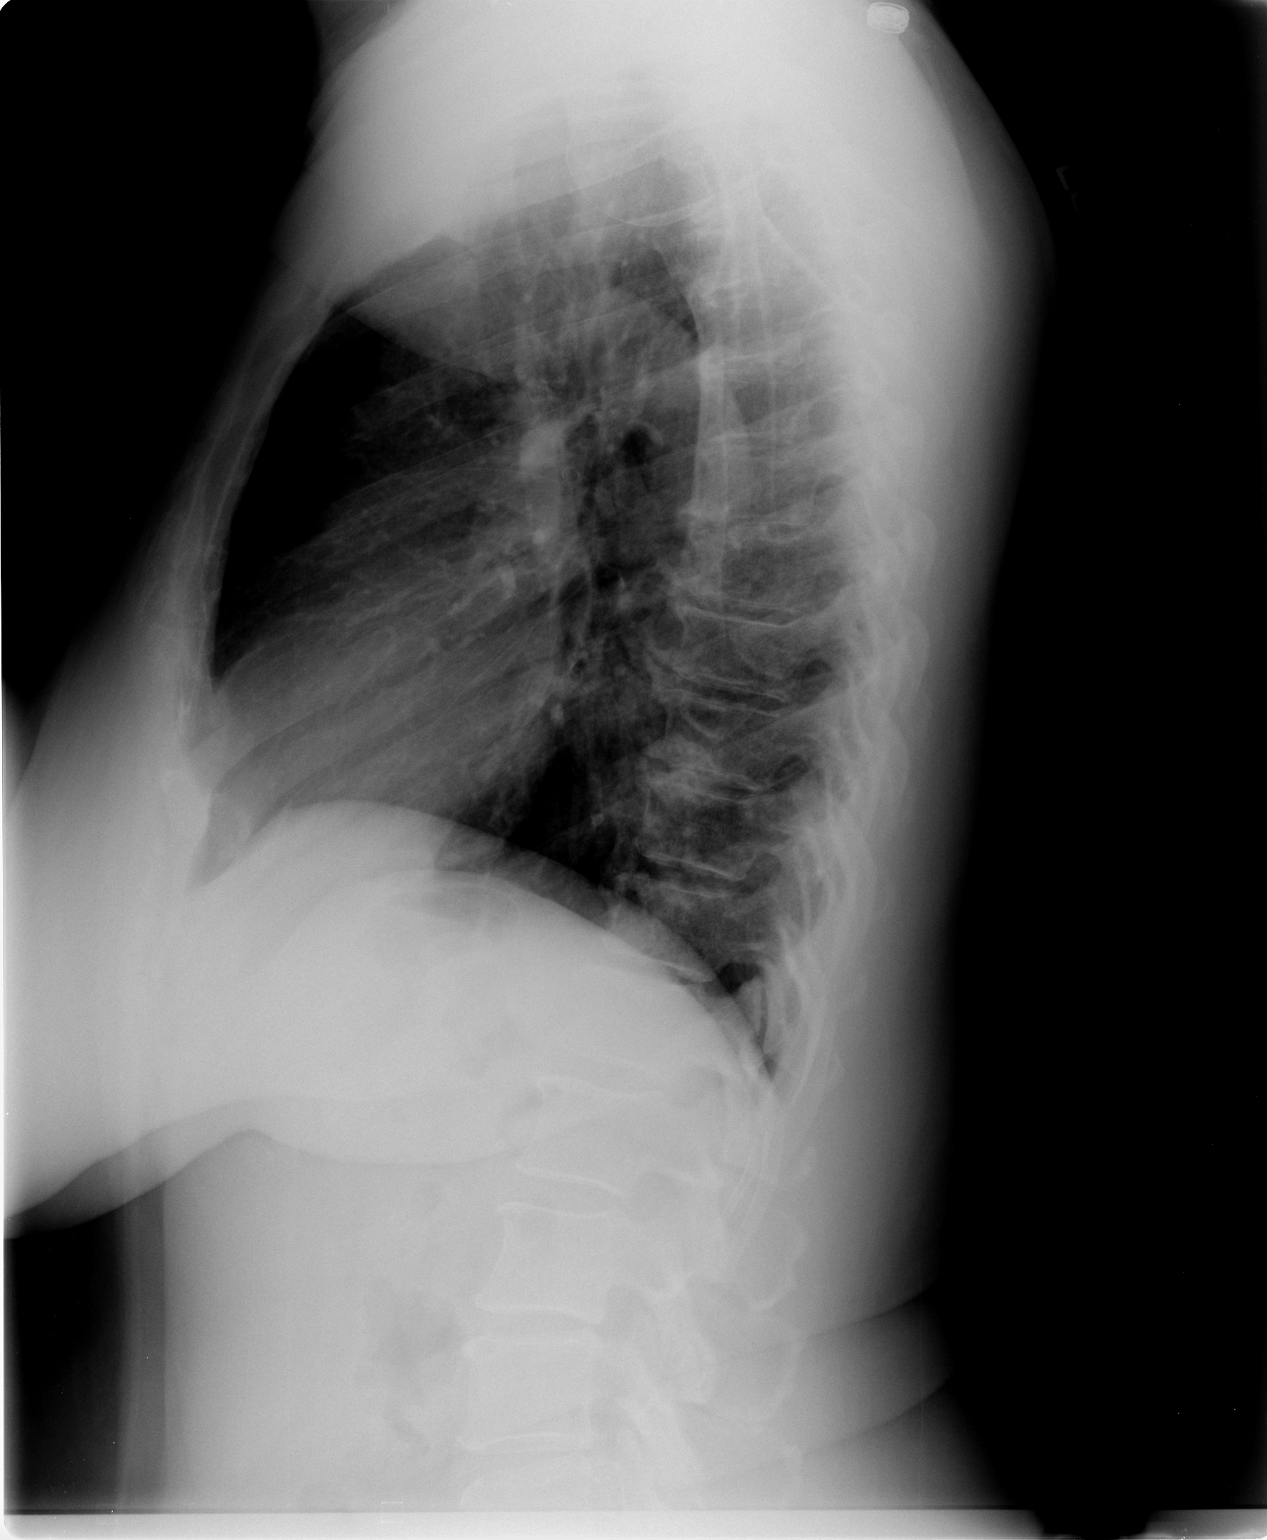

[2 of 2 positions shown; findings below may reference images not displayed]

FINDINGS: Heart and mediastinal contours normal.  Lungs clear.  No
pleural fluid.  Osseous structures and soft tissues unremarkable.
There are surgical clips to the right of the trachea projecting
over the proximal first and second ribs
IMPRESSION: No active disease.

## 2008-08-25 ENCOUNTER — Ambulatory Visit (HOSPITAL_COMMUNITY): Admission: RE | Admit: 2008-08-25 | Discharge: 2008-08-26 | Payer: Self-pay | Admitting: Otolaryngology

## 2008-08-25 ENCOUNTER — Encounter (INDEPENDENT_AMBULATORY_CARE_PROVIDER_SITE_OTHER): Payer: Self-pay | Admitting: Otolaryngology

## 2009-01-03 ENCOUNTER — Emergency Department (HOSPITAL_COMMUNITY): Admission: EM | Admit: 2009-01-03 | Discharge: 2009-01-03 | Payer: Self-pay | Admitting: Emergency Medicine

## 2009-01-03 IMAGING — CT CT NECK W/ CM
1 series · 12 of 14 positions shown, 15 images · IV contrast (agent unspecified)
Comparison: None.

CLINICAL DATA: Swollen tongue, allergic reaction.  Left tongue
swelling.

CT NECK WITH CONTRAST
TECHNIQUE: Multidetector CT imaging of the neck was performed with
intravenous contrast.
Contrast: 100 ml [PN] IV.

[Series 2: neck rtn st · axial · 0.39mm/px · z∈[-249,-51]mm · 12 of 79 slices shown, 15 images]
[im 7/79  soft-tissue]
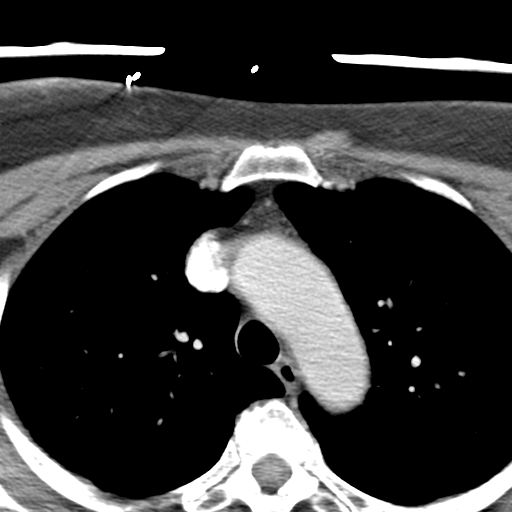
[im 7/79  bone]
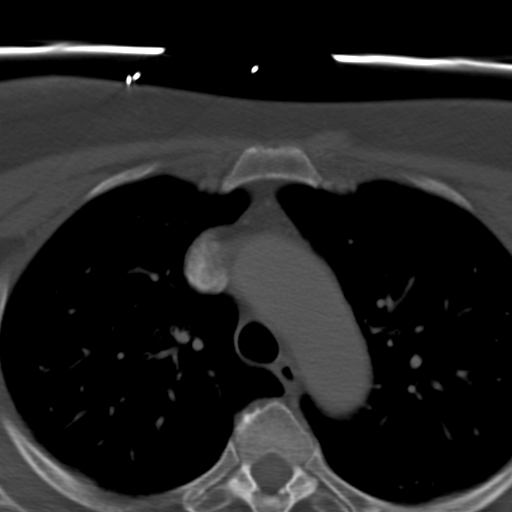
[im 13/79  bone]
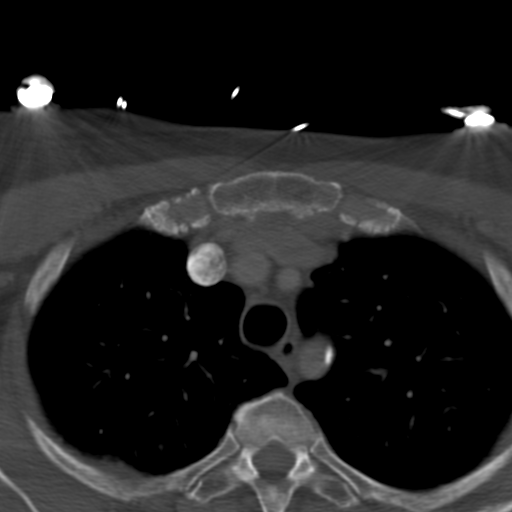
[im 19/79  bone]
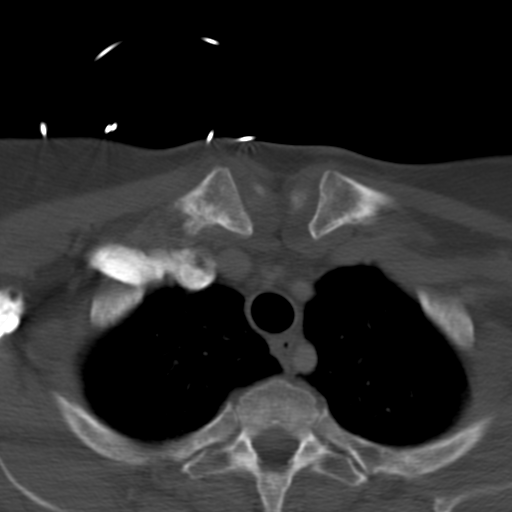
[im 25/79  bone]
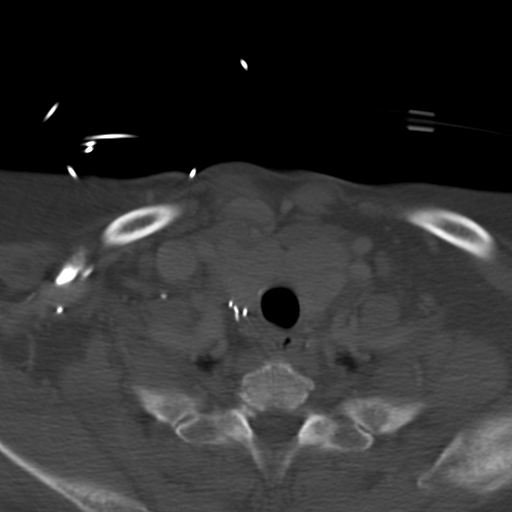
[im 31/79  soft-tissue]
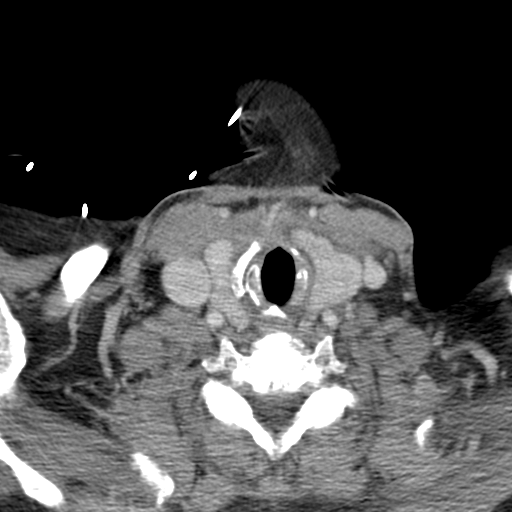
[im 31/79  bone]
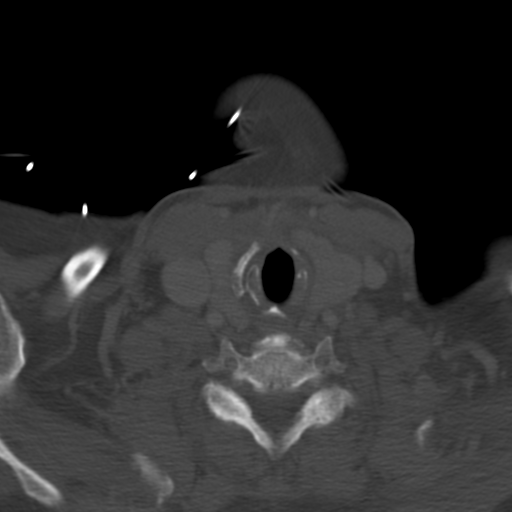
[im 37/79  bone]
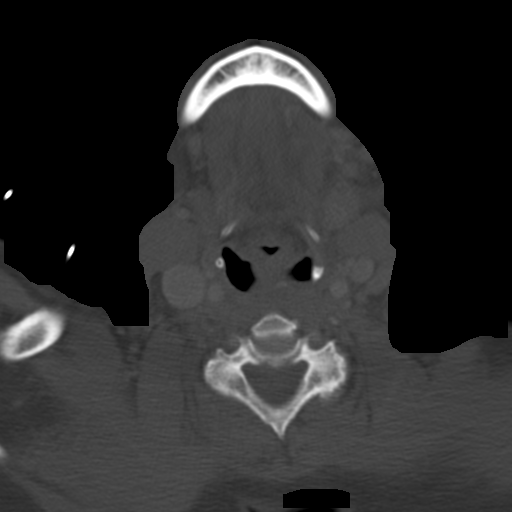
[im 43/79  bone]
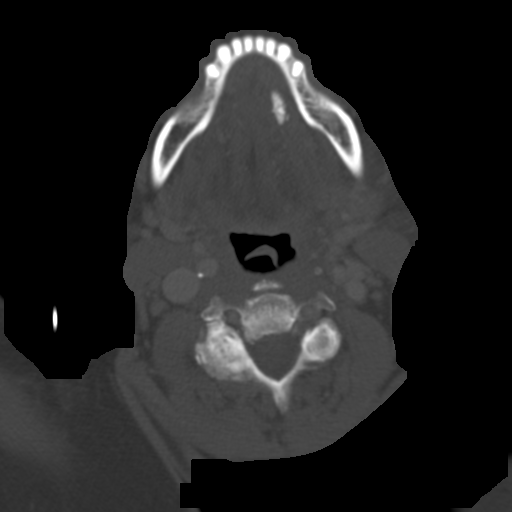
[im 49/79  bone]
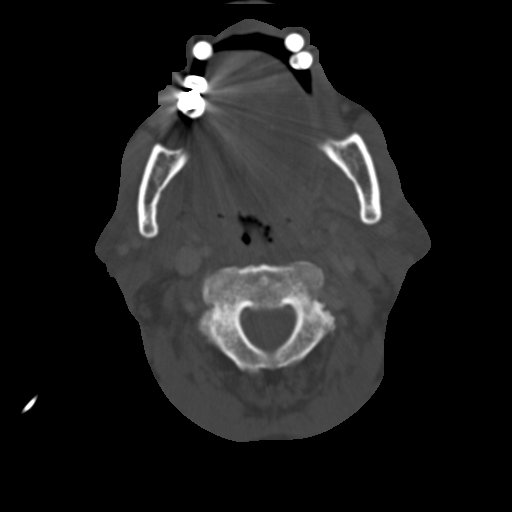
[im 55/79  soft-tissue]
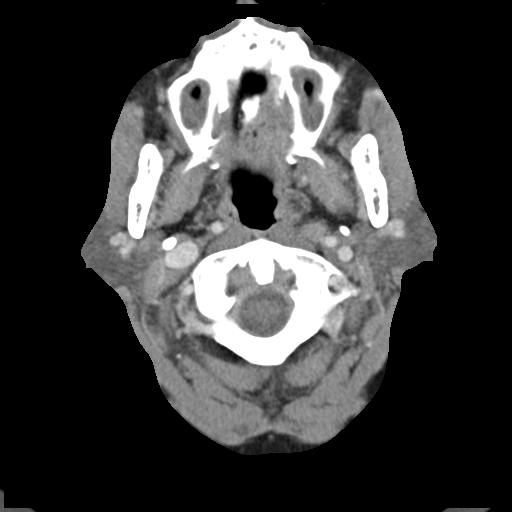
[im 55/79  bone]
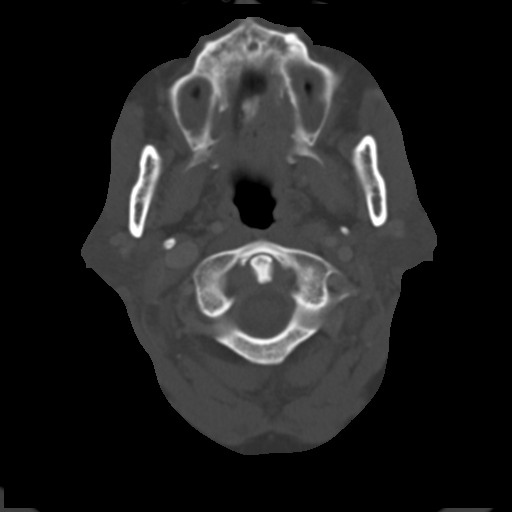
[im 61/79  bone]
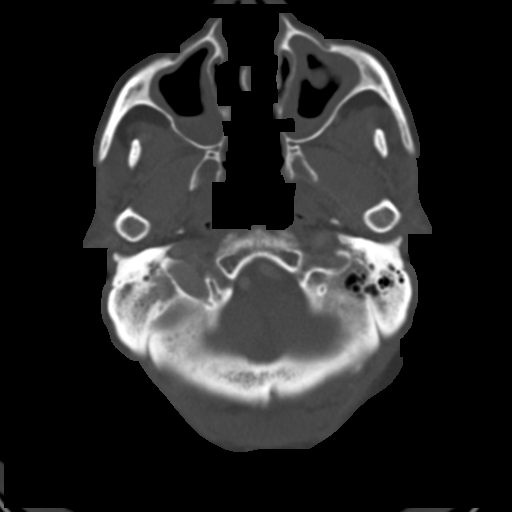
[im 67/79  bone]
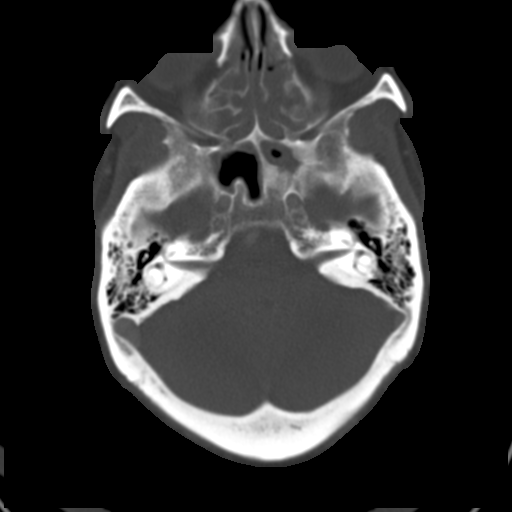
[im 73/79  bone]
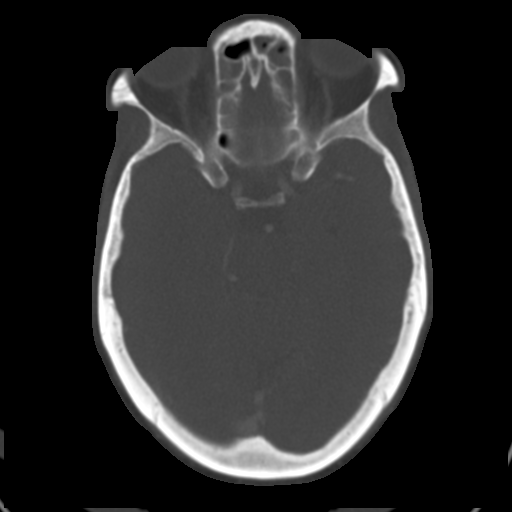

[12 of 14 positions shown; findings below may reference images not displayed]

FINDINGS: There is a large calculus in the floor the mouth on the
left measuring 8 x 16 mm.  This is compatible with a calculus in
Warthins  duct of the submandibular gland.  The left submandibular
gland is mildly edematous and enlarged.

Enlarged level I lymph nodes on the left measure 8 x 13 mm and 9 x
12 mm.  These may be  palpable and are located below the body of
the mandible.  The posterior node has a fatty hilum and is probably
a reactive benign node.  There is an enlarged level I node on the
right below the mandible, measuring 20 x 13 mm.  This also has a
fatty hilum and may be a reactive benign node however could be
pathologic based on the size.  Enlarged level II node on the left
measures 12 x 23 mm and enlarged level II node on the right
measures 11 x 11 mm.

Negative for abscess.  The tonsils are not enlarged.  The airway is
patent.  The tongue appears normal.  There is a mild goiter with
mild enlargement and heterogeneity of the thyroid bilaterally.  The
lung apices are clear bilaterally.

Cervical facet degeneration is noted throughout the cervical spine.
There is mild disc degeneration and spondylosis at C5-6 and C6-7.

There is acute and chronic sinusitis with diffuse mucosal
thickening in the paranasal sinuses and air-fluid levels in the
maxillary sinuses.
IMPRESSION: There is a very large calculus in Warthins duct near  the papilla.

There is bilateral cervical lymphadenopathy, these may be reactive
nodes.

Sinusitis.

## 2009-02-03 ENCOUNTER — Emergency Department (HOSPITAL_COMMUNITY): Admission: EM | Admit: 2009-02-03 | Discharge: 2009-02-03 | Payer: Self-pay | Admitting: Emergency Medicine

## 2009-02-03 IMAGING — CR DG CHEST 2V
2 series · 2 of 2 positions shown · non-contrast
Comparison: [DATE]

CLINICAL DATA: Short of breath.  Cough.  Hypoxia.  Asthma.

CHEST - 2 VIEW

[view not recorded (1 of 2)]
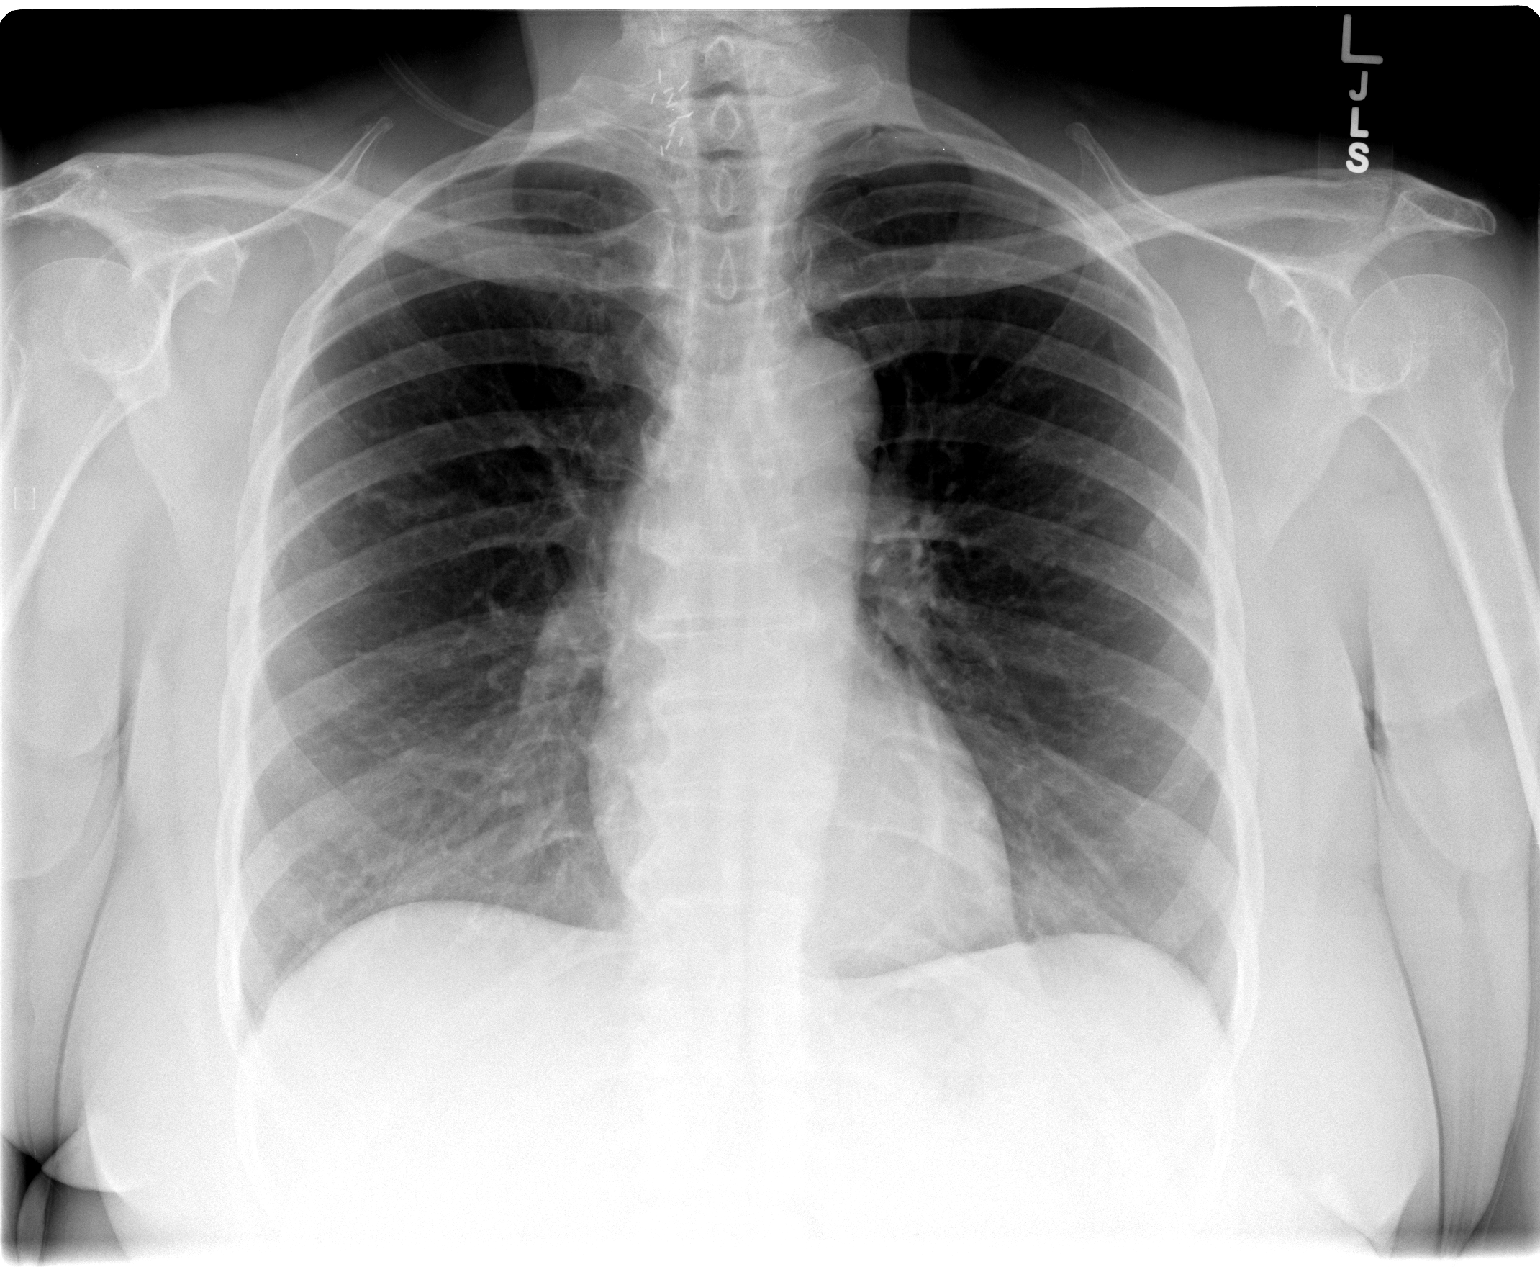

[view not recorded (2 of 2)]
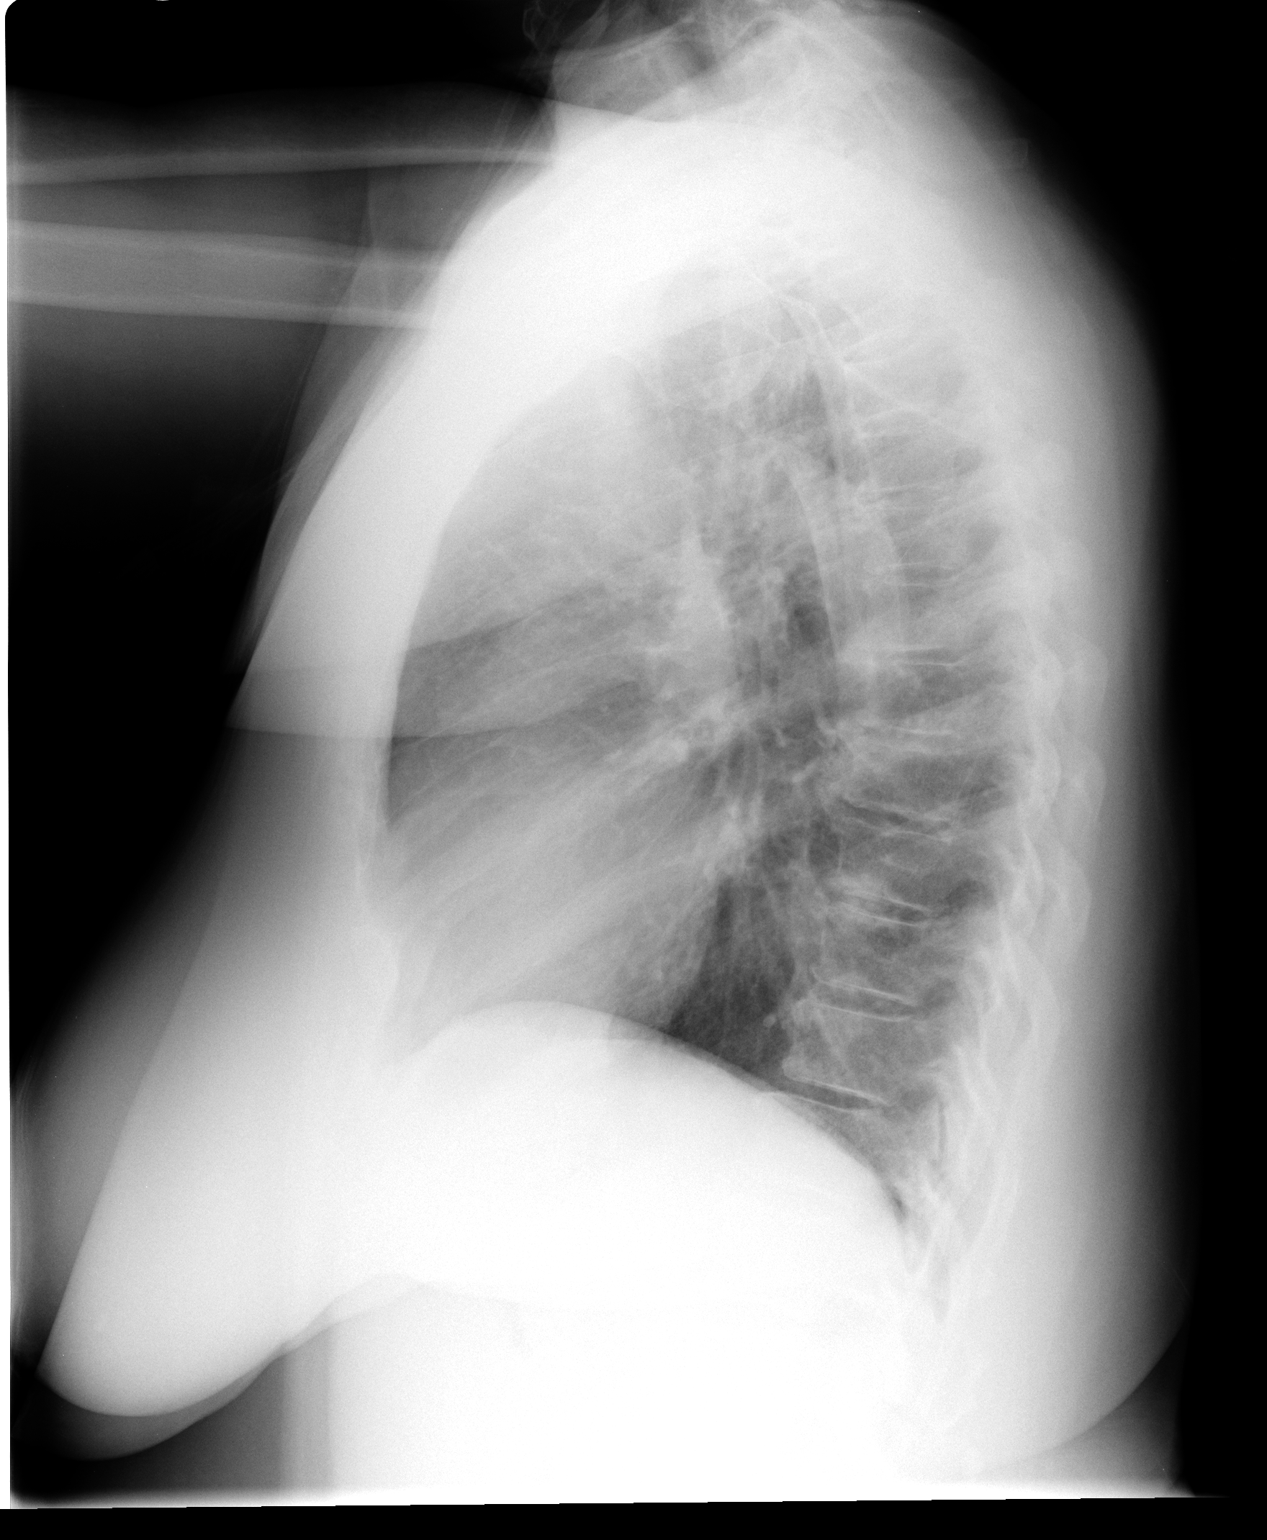

[2 of 2 positions shown; findings below may reference images not displayed]

FINDINGS: The heart size and mediastinal contours are within
normal limits.  Both lungs are clear.  The visualized skeletal
structures are unremarkable. Surgical clips are seen at the level
of the thoracic inlet on the right.
IMPRESSION: No active cardiopulmonary disease.

## 2009-06-06 ENCOUNTER — Ambulatory Visit (HOSPITAL_COMMUNITY): Admission: RE | Admit: 2009-06-06 | Discharge: 2009-06-06 | Payer: Self-pay | Admitting: Internal Medicine

## 2009-06-07 ENCOUNTER — Emergency Department (HOSPITAL_COMMUNITY): Admission: EM | Admit: 2009-06-07 | Discharge: 2009-06-07 | Payer: Self-pay | Admitting: Emergency Medicine

## 2009-07-11 ENCOUNTER — Emergency Department (HOSPITAL_COMMUNITY): Admission: EM | Admit: 2009-07-11 | Discharge: 2009-07-11 | Payer: Self-pay | Admitting: Emergency Medicine

## 2009-07-11 IMAGING — CR DG LUMBAR SPINE COMPLETE 4+V
6 series · 6 of 6 positions shown · non-contrast
Comparison: None.

CLINICAL DATA: Right hip pain.  No injury.

LUMBAR SPINE - COMPLETE 4+ VIEW

[t l-spine a.p.]
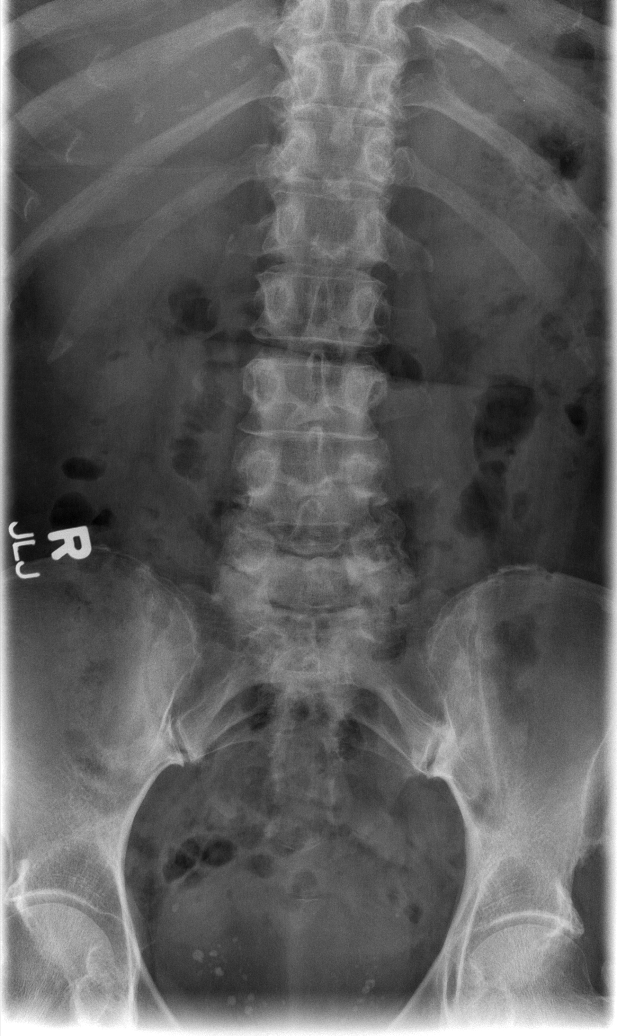

[t l-spine oblique exposure (1 of 3)]
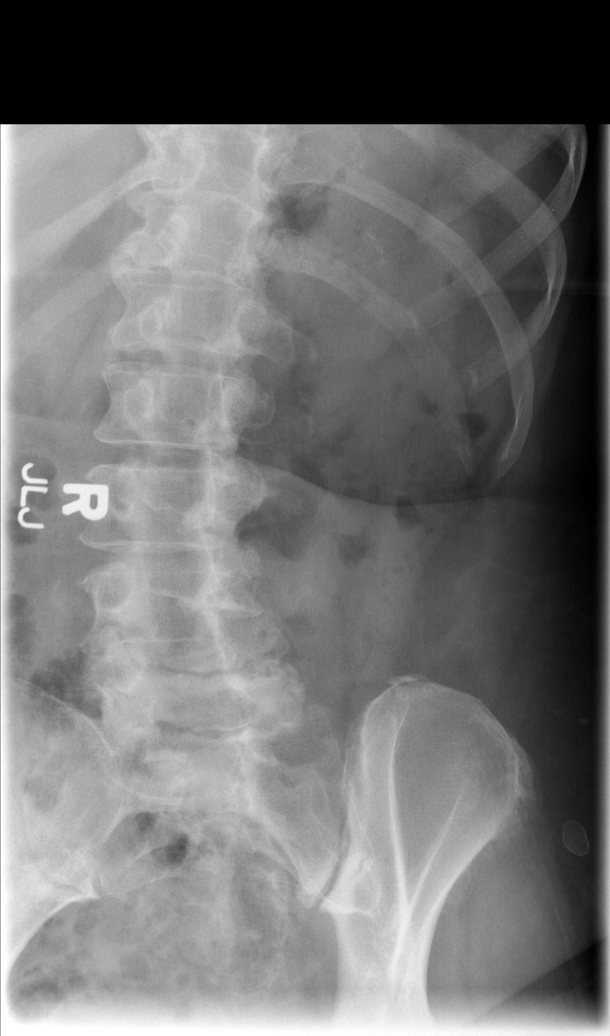

[t l-spine oblique exposure (2 of 3)]
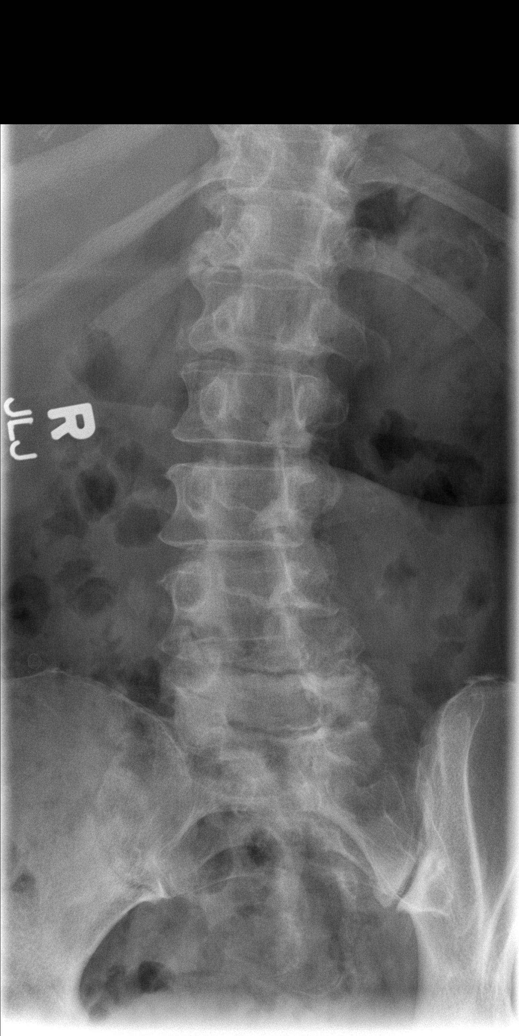

[t l-spine oblique exposure (3 of 3)]
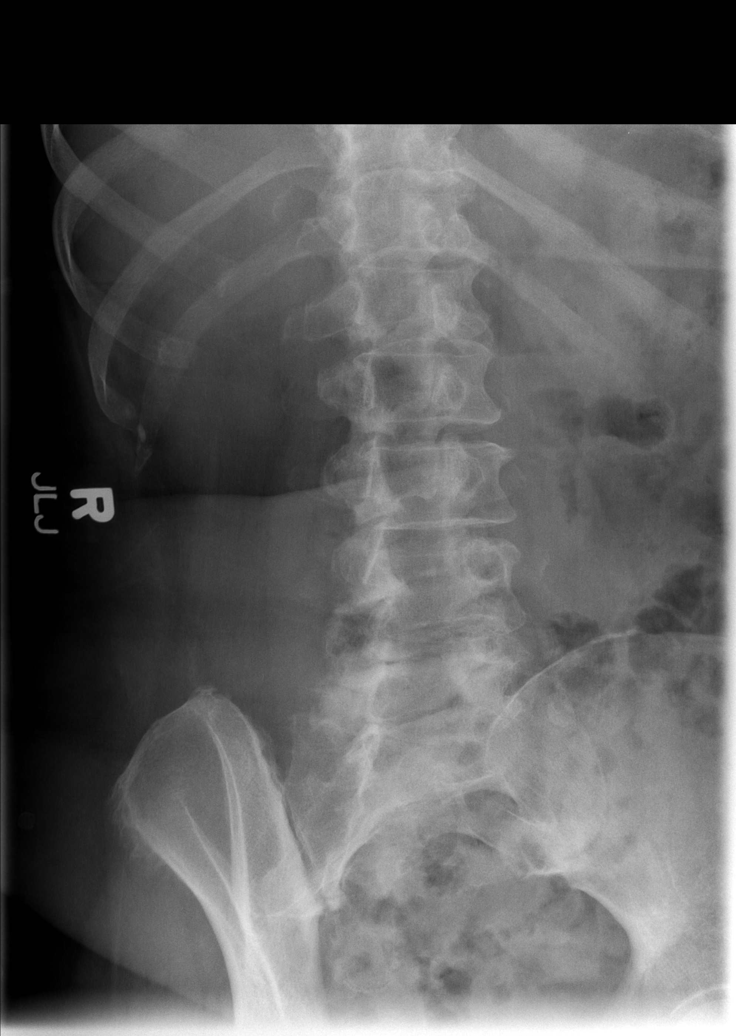

[t l-spine lat]
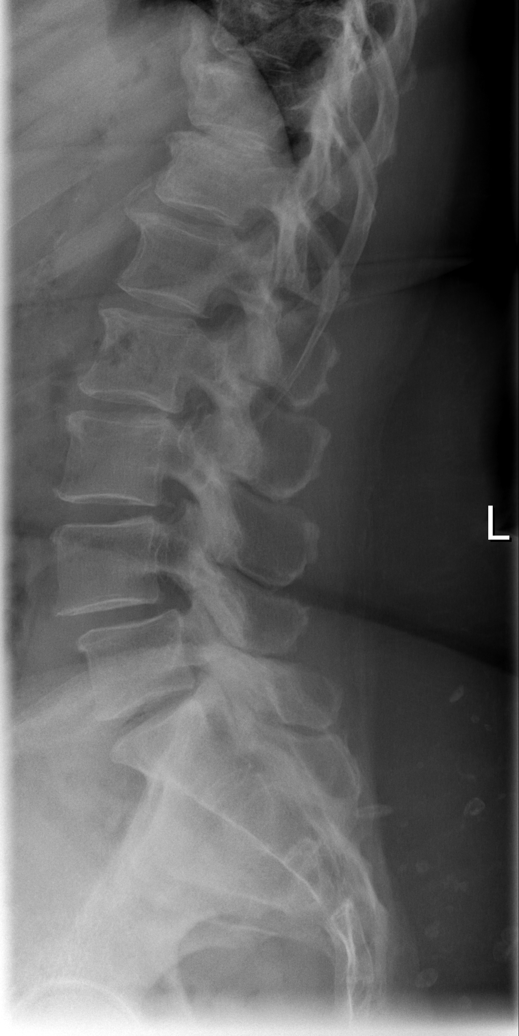

[t l-spine l5-s1 spot]
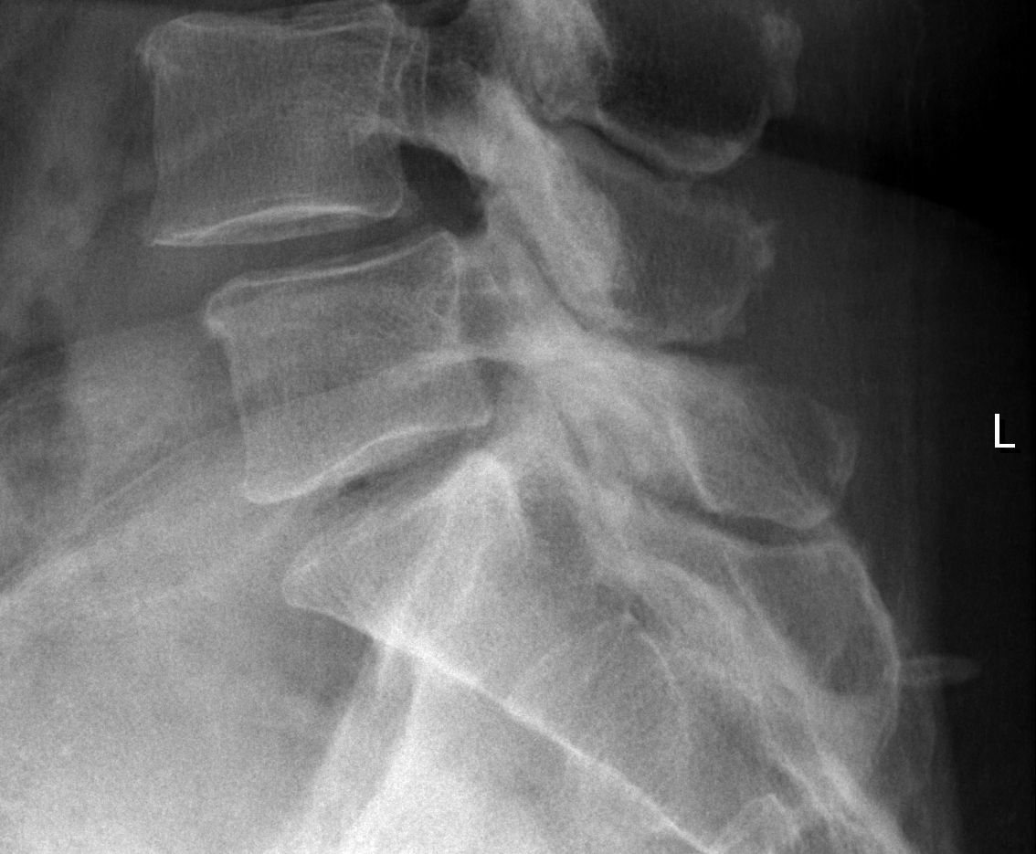

[6 of 6 positions shown; findings below may reference images not displayed]

FINDINGS: Degenerative disc disease throughout the lumbar spine,
most pronounced at L5-S1.  There is diffuse facet disease.  5 mm
anterolisthesis of L4 on L5 related to facet disease.  No fracture
or acute subluxation.
IMPRESSION: No acute bony abnormality.  Spondylosis as described above.

## 2009-08-01 ENCOUNTER — Ambulatory Visit (HOSPITAL_COMMUNITY): Admission: RE | Admit: 2009-08-01 | Discharge: 2009-08-01 | Payer: Self-pay | Admitting: Internal Medicine

## 2009-08-01 IMAGING — CR DG HIP COMPLETE 2+V*R*
3 series · 3 of 3 positions shown · non-contrast
Comparison: [DATE]

CLINICAL DATA: Right hip pain

RIGHT HIP - COMPLETE 2+ VIEW

[view not recorded (1 of 3)]
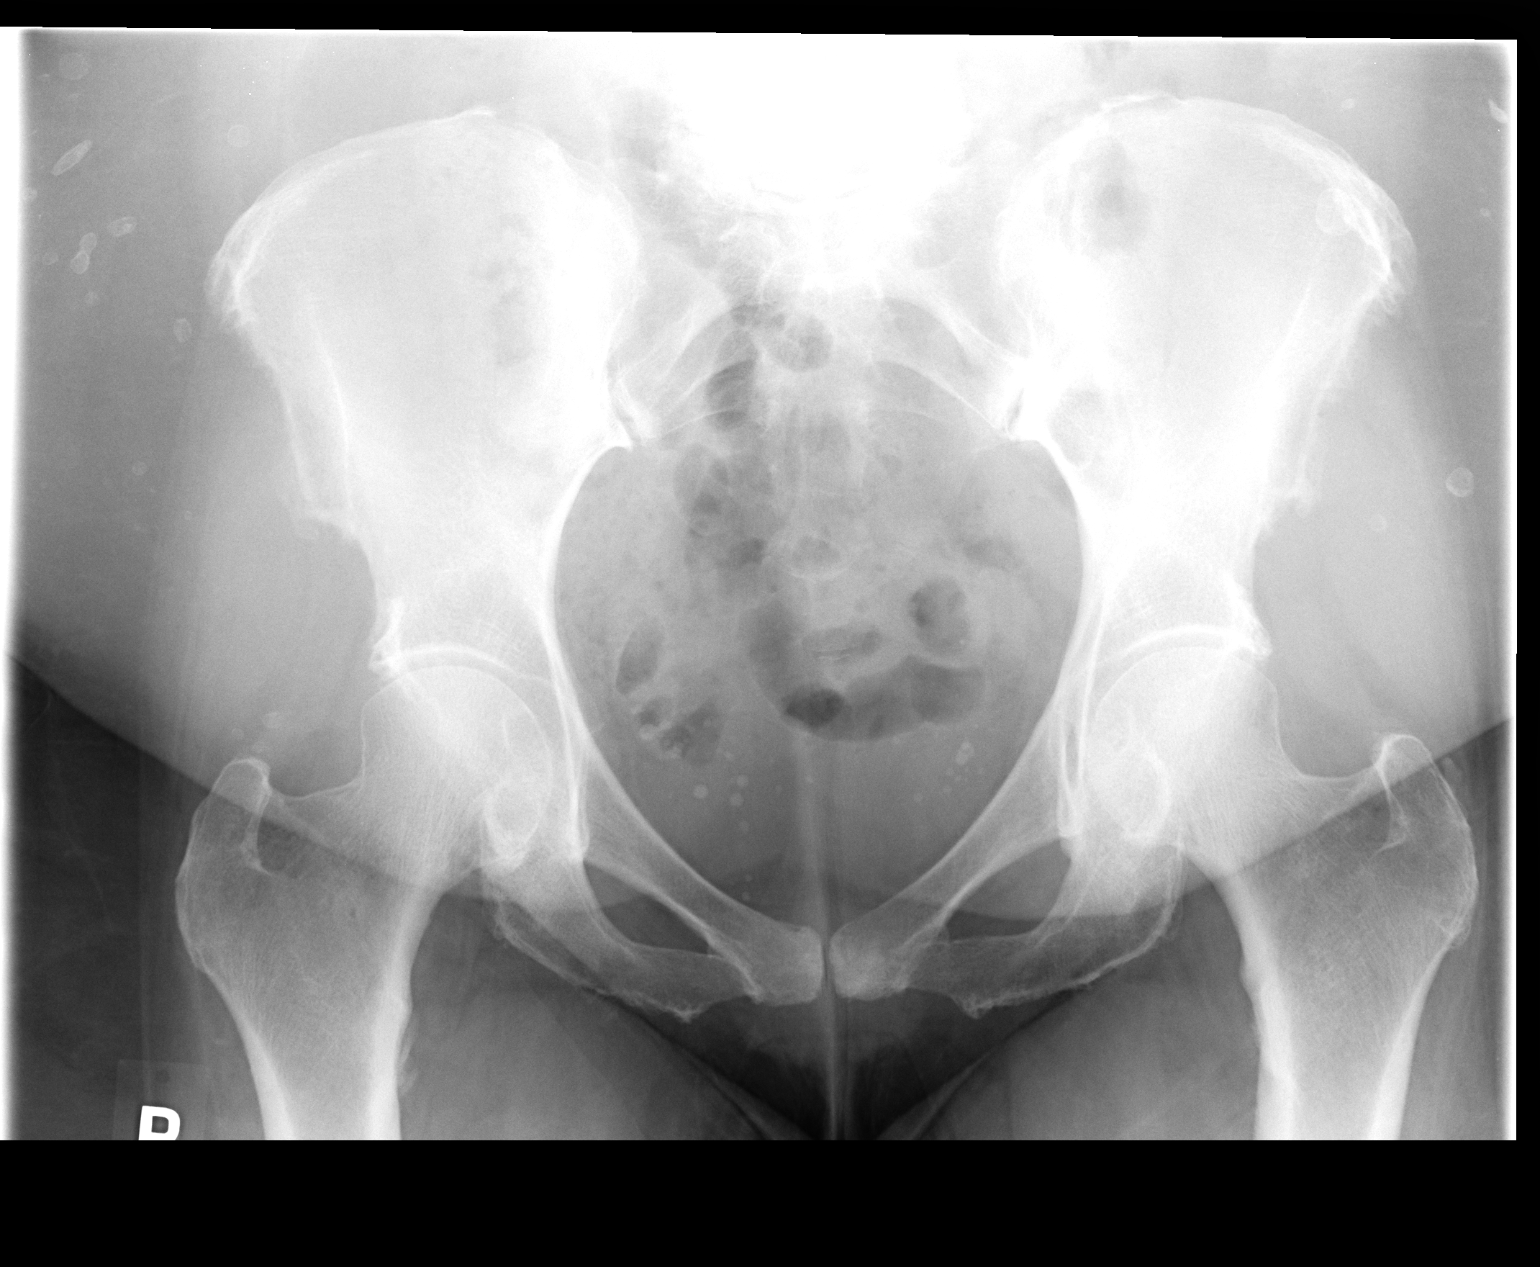

[view not recorded (2 of 3)]
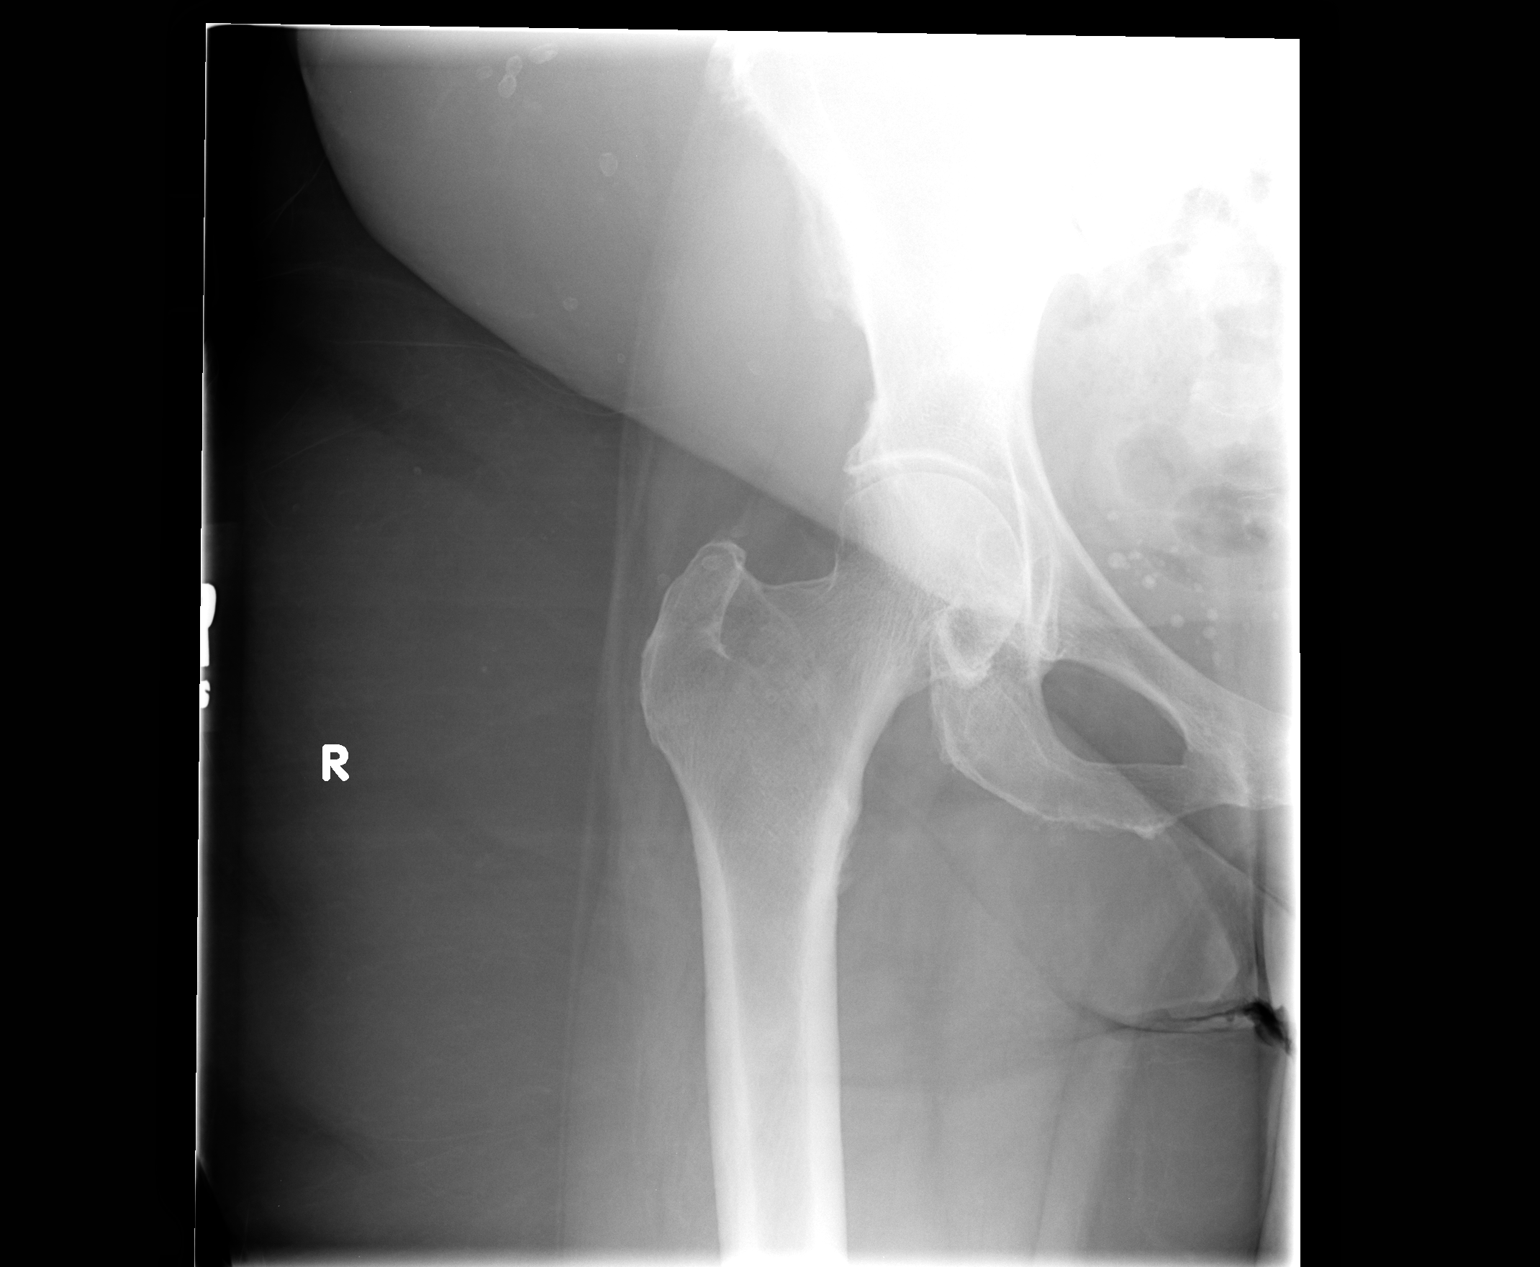

[view not recorded (3 of 3)]
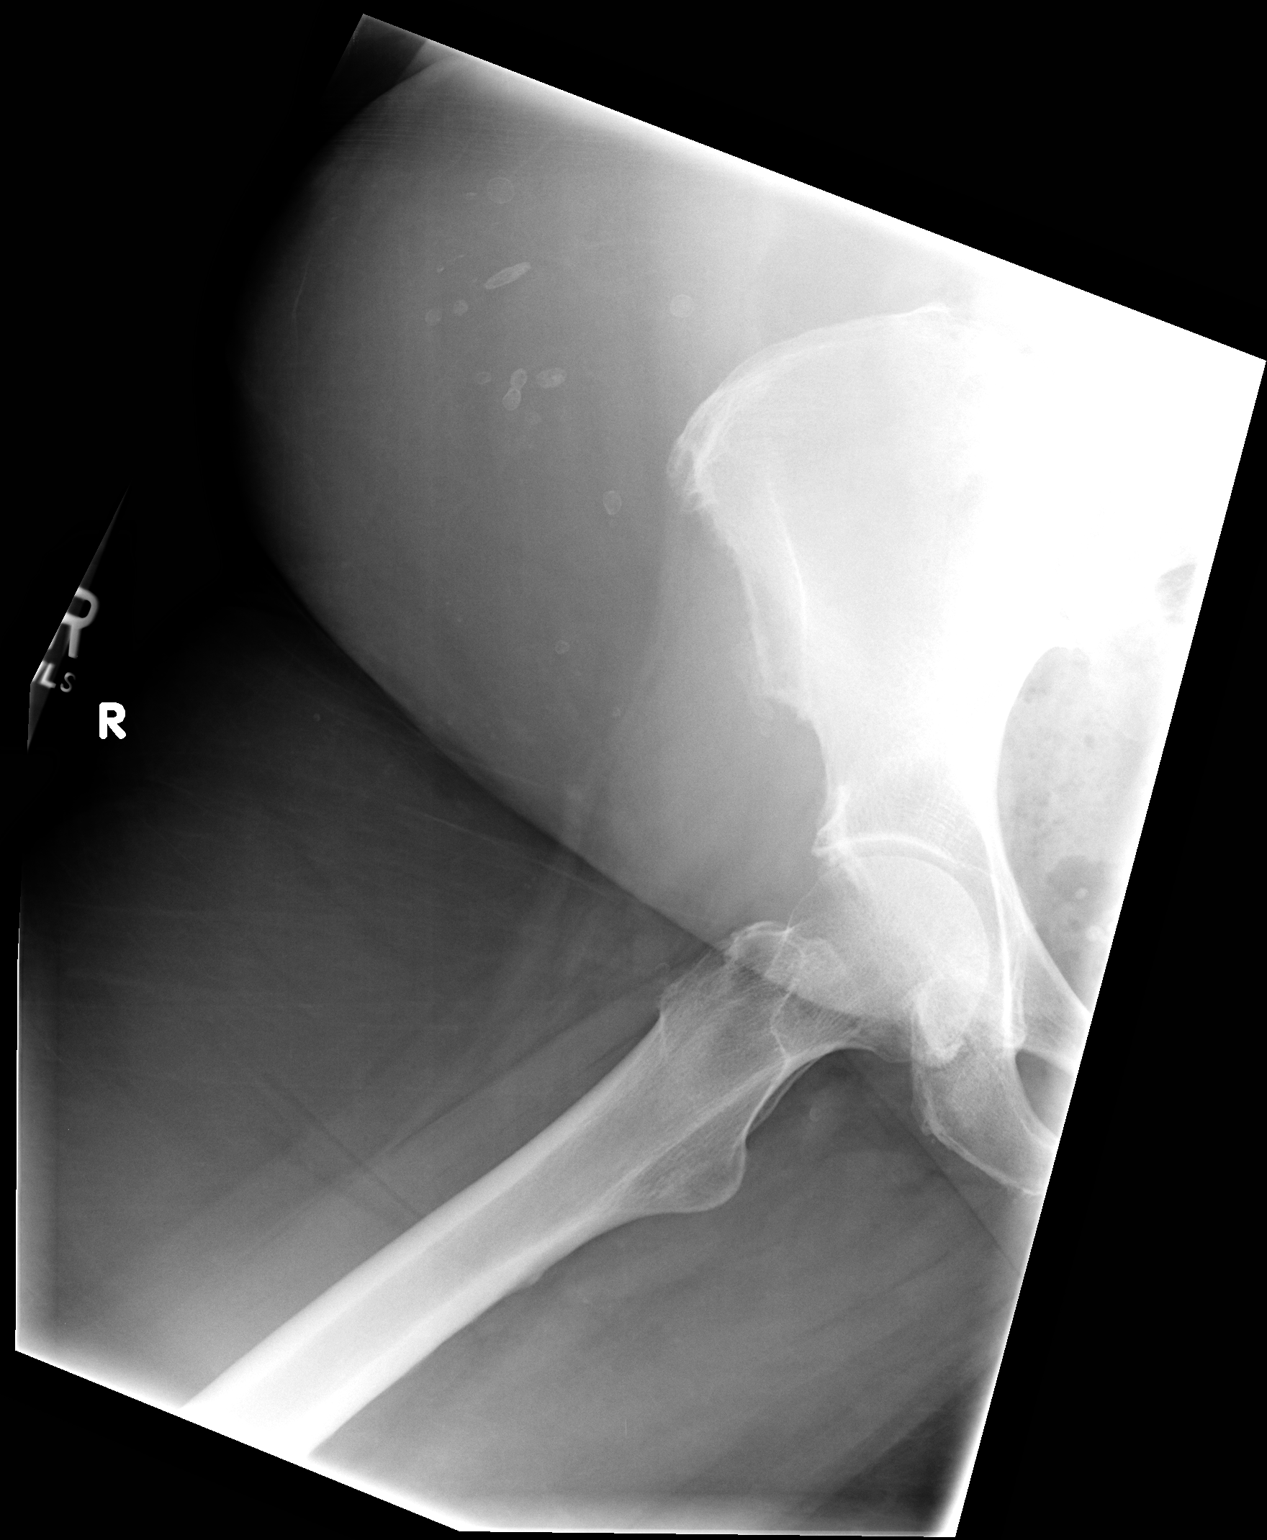

[3 of 3 positions shown; findings below may reference images not displayed]

FINDINGS: No evidence of right hip fracture or dislocation.  Mild
degenerative spurring noted without significant joint space
narrowing.  Degenerative changes also seen involving the pubic
symphysis and lower lumbar spine.
IMPRESSION: 1.  No acute findings.
2.  Mild right hip degenerative spurring.  More severe degenerative
changes involving the lower lumbar spine and pubic symphysis.

## 2009-08-30 ENCOUNTER — Encounter: Admission: RE | Admit: 2009-08-30 | Discharge: 2009-08-30 | Payer: Self-pay | Admitting: Allergy and Immunology

## 2009-08-30 IMAGING — CR DG CHEST 2V
2 series · 2 of 2 positions shown · non-contrast
Comparison: None.
COMPARISON: [HOSPITAL] chest x-ray [DATE] [DATE] and
[DATE].
COMPARISON: None.

Addendum Begins
CLINICAL DATA: CHEST - 2 VIEW
CLINICAL DATA: Cough, history asthma.

CHEST - 2 VIEW

[w chest pa]
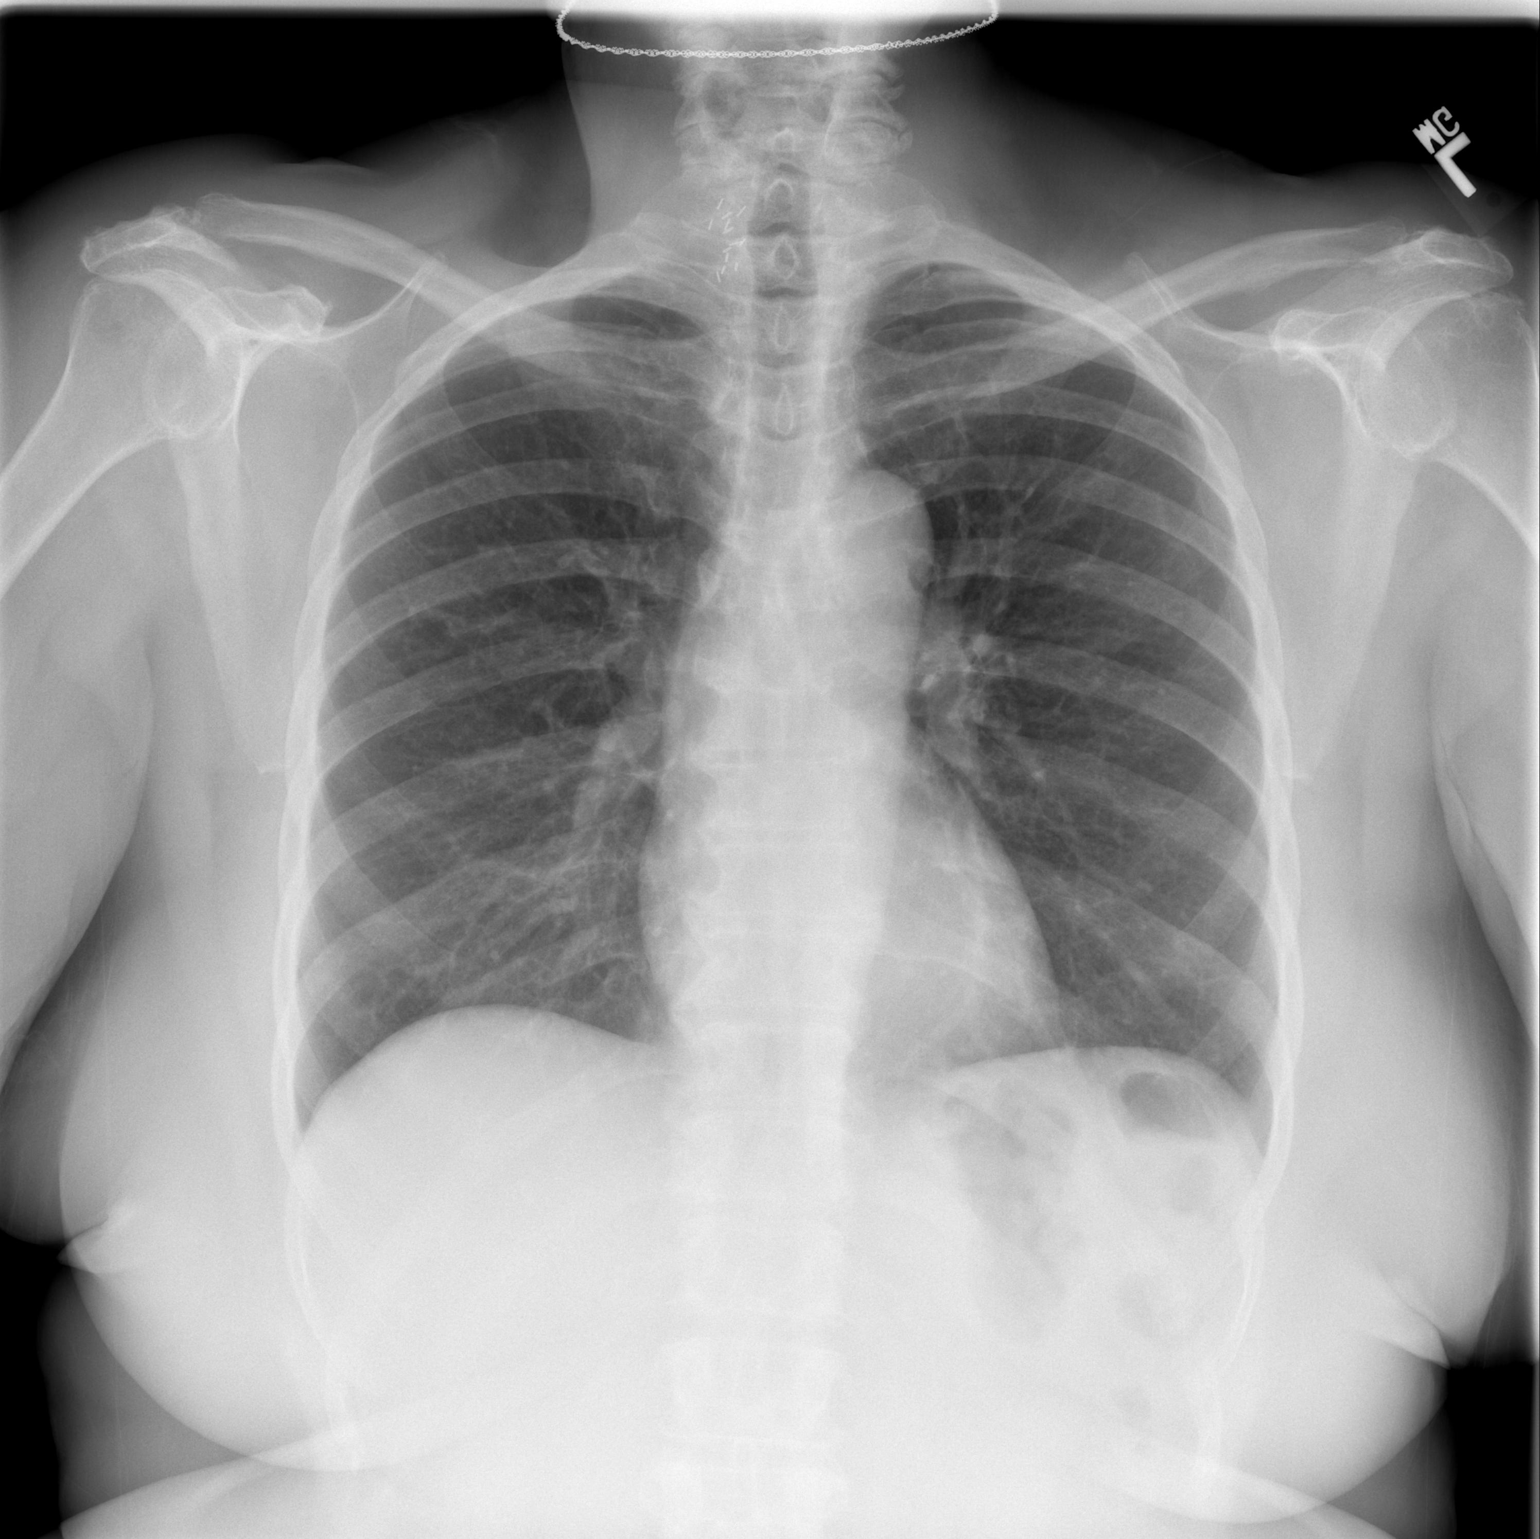

[w chest lat]
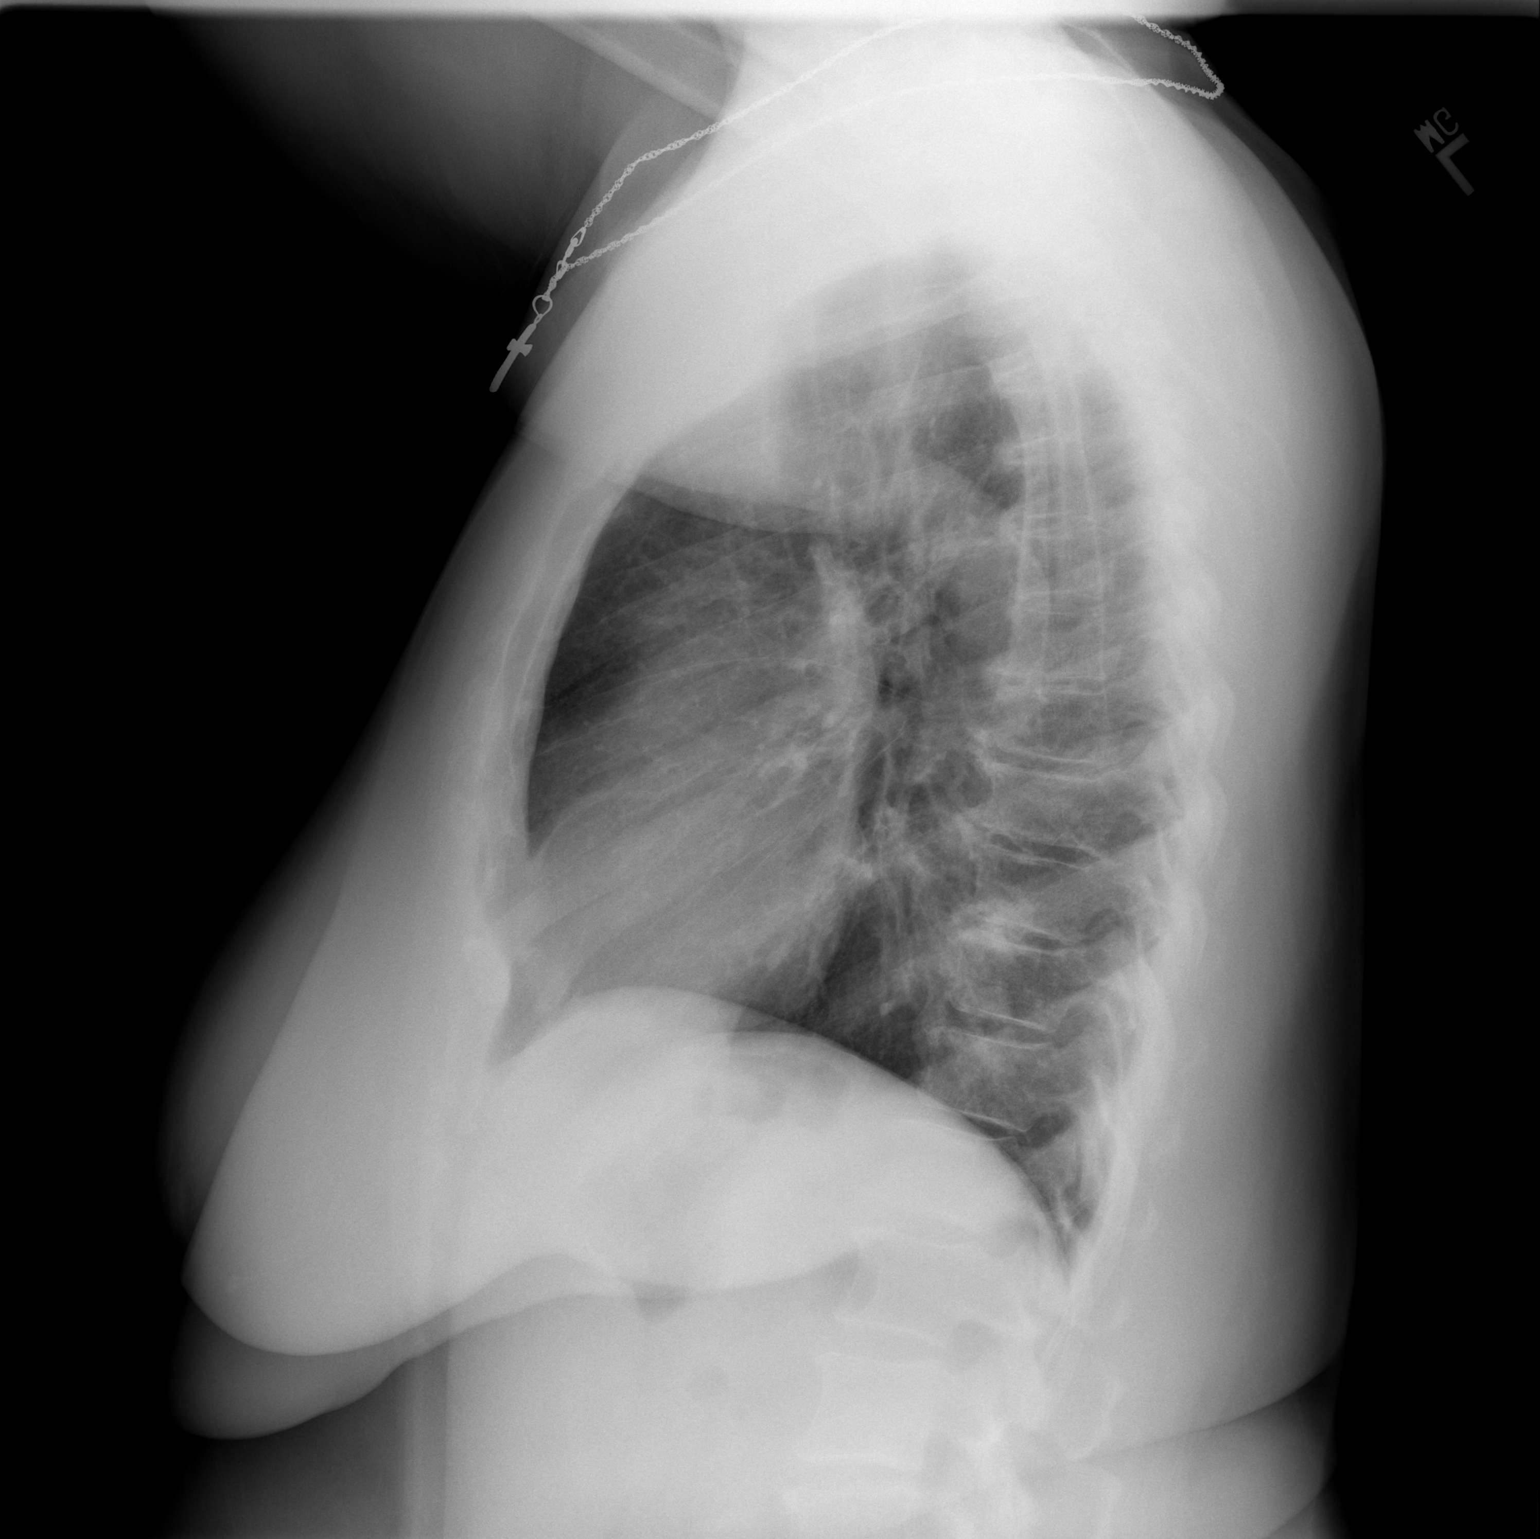

[2 of 2 positions shown; findings below may reference images not displayed]

FINDINGS: 
IMPRESSION: 
FINDINGS: The heart size and mediastinal contours are within
normal limits.  Both lungs are clear.  The visualized skeletal
structures (stable slight degenerative changes dorsal spine) are
unremarkable.
IMPRESSION: Stable.  No active cardiopulmonary disease.

Addendum Ends
FINDINGS: 
IMPRESSION:

## 2009-10-02 IMAGING — CR DG HIP COMPLETE 2+V*R*
3 series · 3 of 3 positions shown · non-contrast
Comparison: None

CLINICAL DATA: Right hip pain.

RIGHT HIP - COMPLETE 2+ VIEW

[t pelvis a.p.]
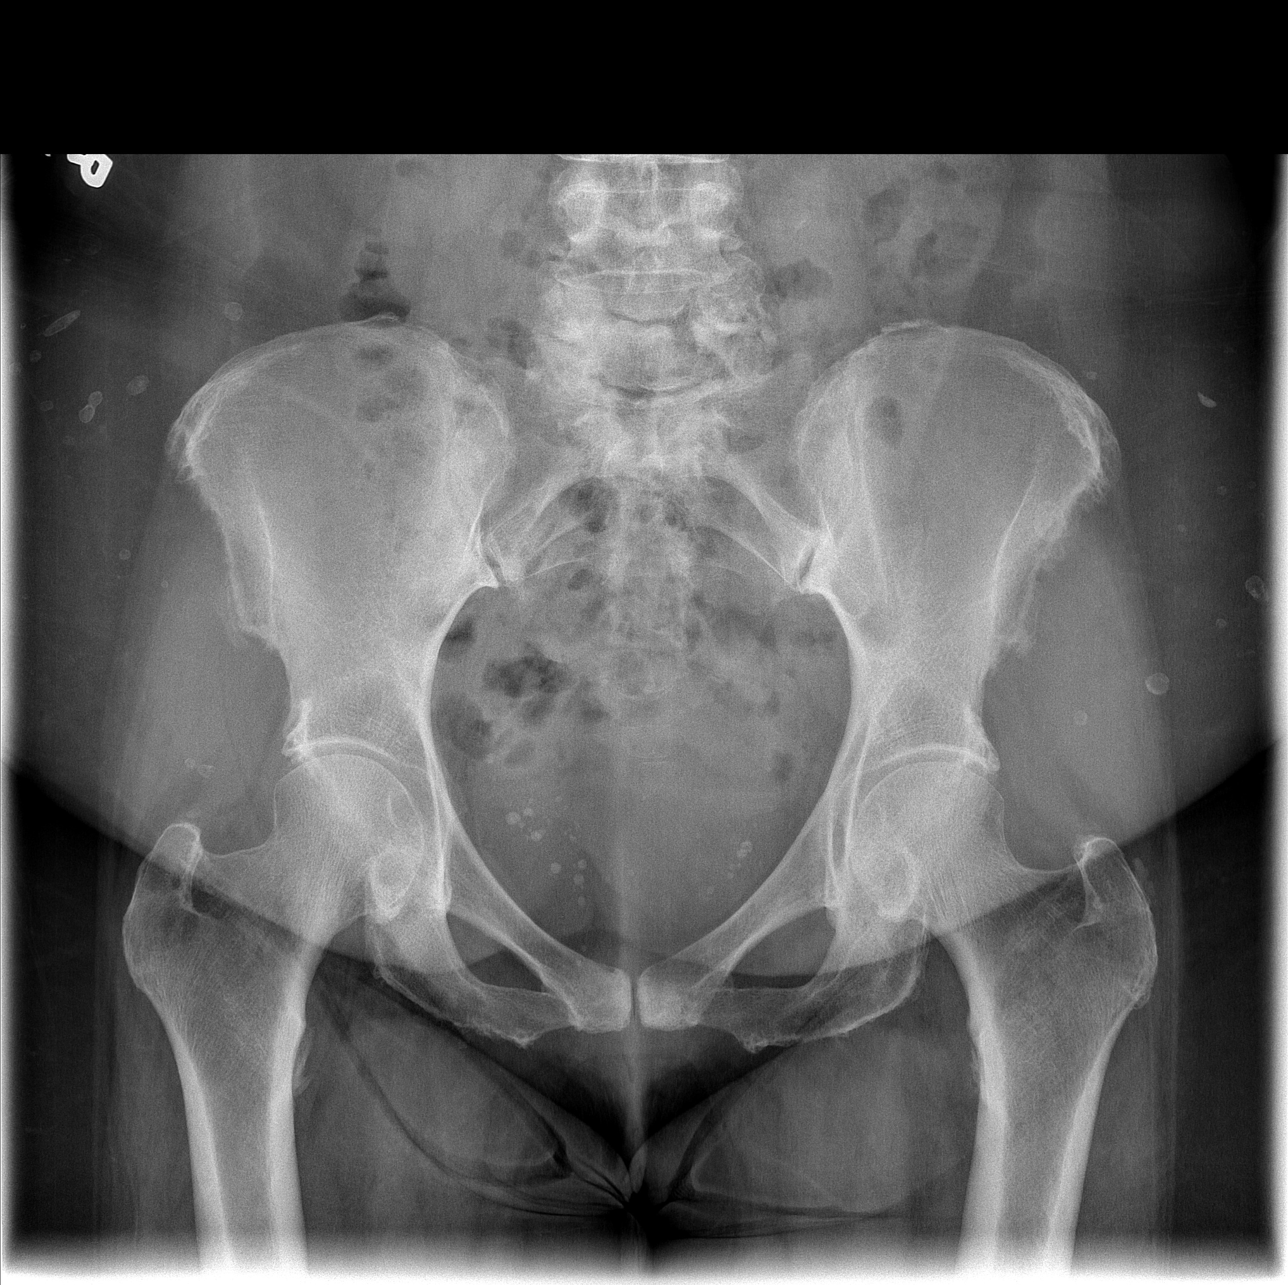

[t hip ap right]
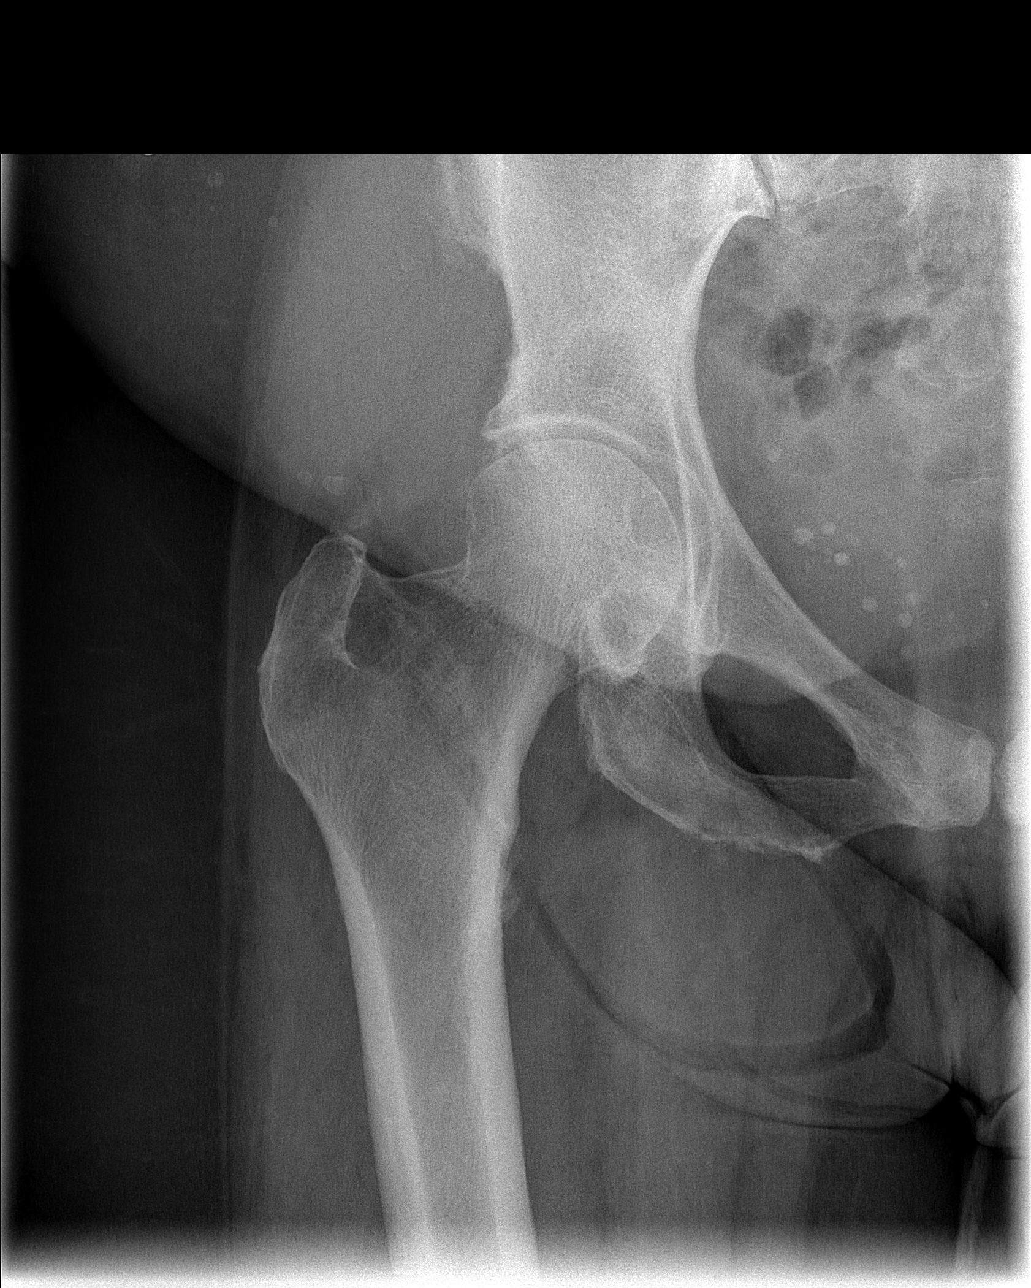

[t hip frog leg right]
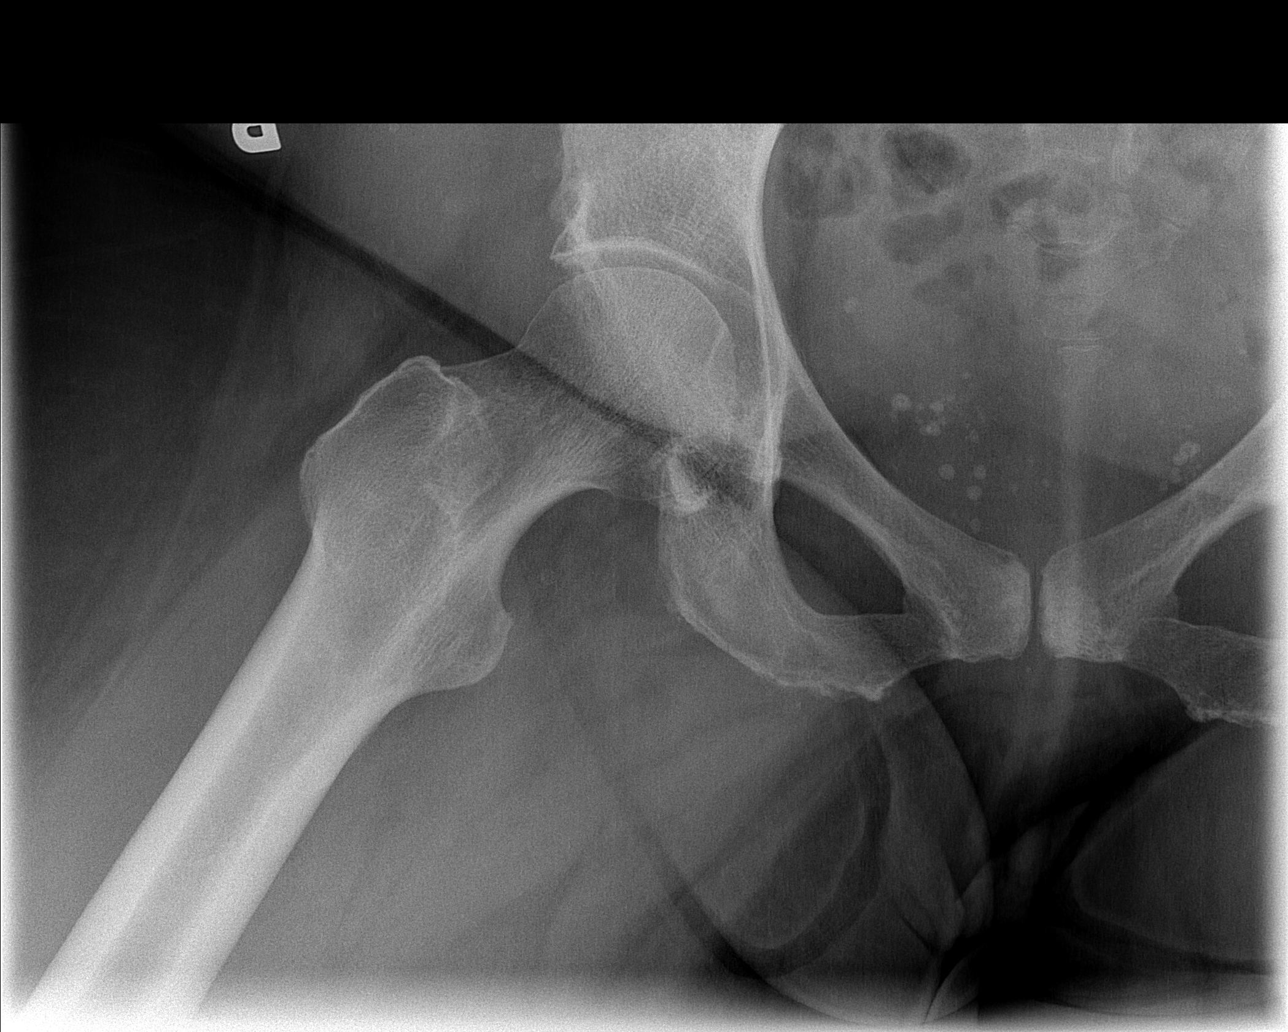

[3 of 3 positions shown; findings below may reference images not displayed]

FINDINGS: Degenerative changes in the lower lumbar spine.  Mild
degenerative changes in the hips bilaterally. No acute bony
abnormality.  Specifically, no fracture, subluxation, or
dislocation.  Soft tissues are intact. SI joints are symmetric and
unremarkable.
IMPRESSION: No acute bony abnormality.

## 2009-10-24 ENCOUNTER — Emergency Department (HOSPITAL_COMMUNITY): Admission: EM | Admit: 2009-10-24 | Discharge: 2009-10-24 | Payer: Self-pay | Admitting: Emergency Medicine

## 2009-10-24 IMAGING — CR DG CHEST 2V
2 series · 2 of 2 positions shown · non-contrast
Comparison: [DATE].

CLINICAL DATA: Ex-smoker with cough, wheezing and shortness of
breath.  Asthma.

CHEST - 2 VIEW

[w chest pa]
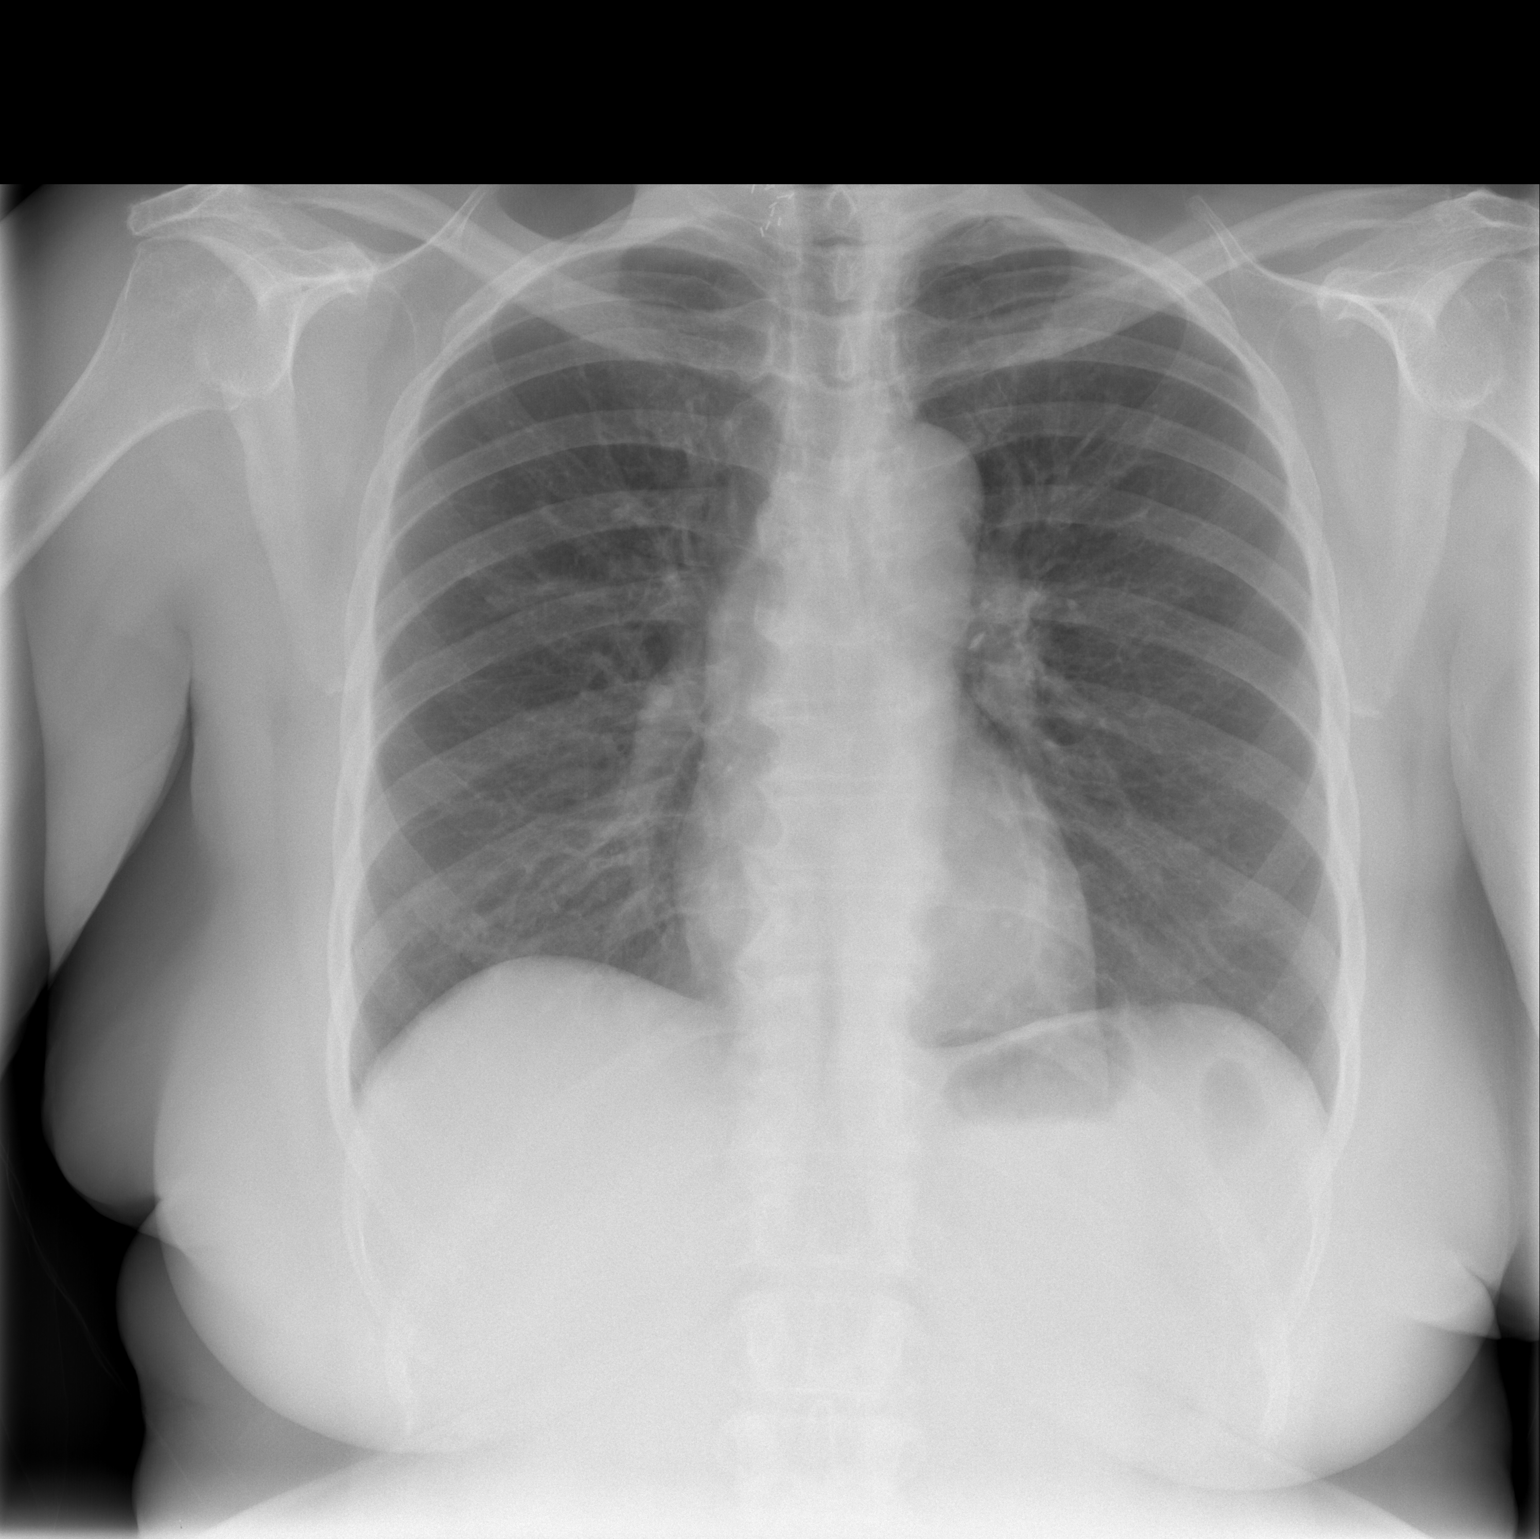

[w chest lat]
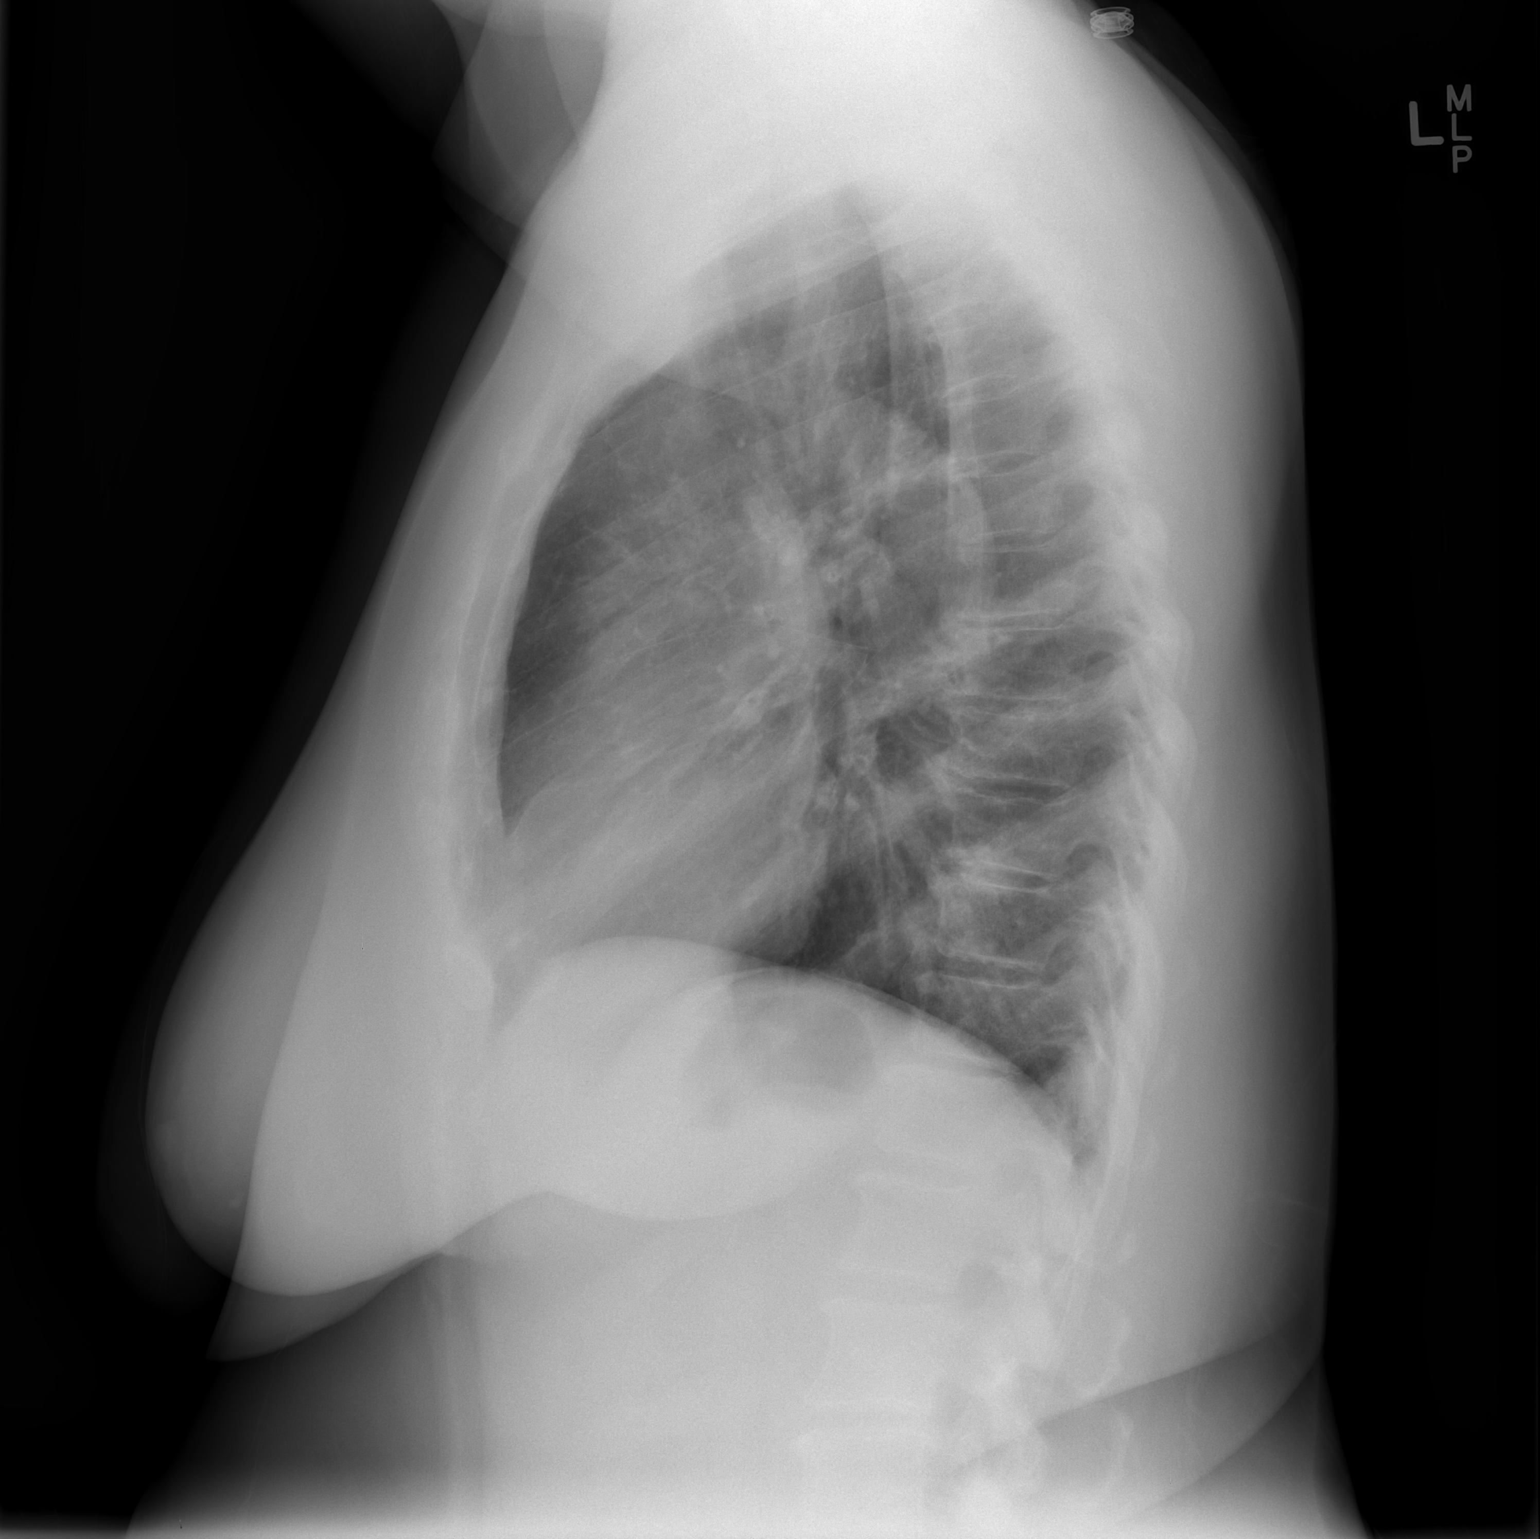

[2 of 2 positions shown; findings below may reference images not displayed]

FINDINGS: Stable normal sized heart, clear lungs, mild diffuse
peribronchial thickening and thoracic spine degenerative changes.
IMPRESSION: Stable mild chronic bronchitic changes.  No acute abnormality.

## 2010-01-14 ENCOUNTER — Emergency Department (HOSPITAL_COMMUNITY): Admission: EM | Admit: 2010-01-14 | Discharge: 2010-01-14 | Payer: Self-pay | Admitting: Family Medicine

## 2010-01-14 IMAGING — CR DG CHEST 2V
2 series · 2 of 2 positions shown · non-contrast
Comparison: Two-view chest x-ray [DATE], [DATE],
[DATE], [DATE], and [DATE].

CLINICAL DATA: Cough.  Chest congestion.  History of asthma and
bronchitis.

CHEST - 2 VIEW [DATE]:

[view not recorded (1 of 2)]
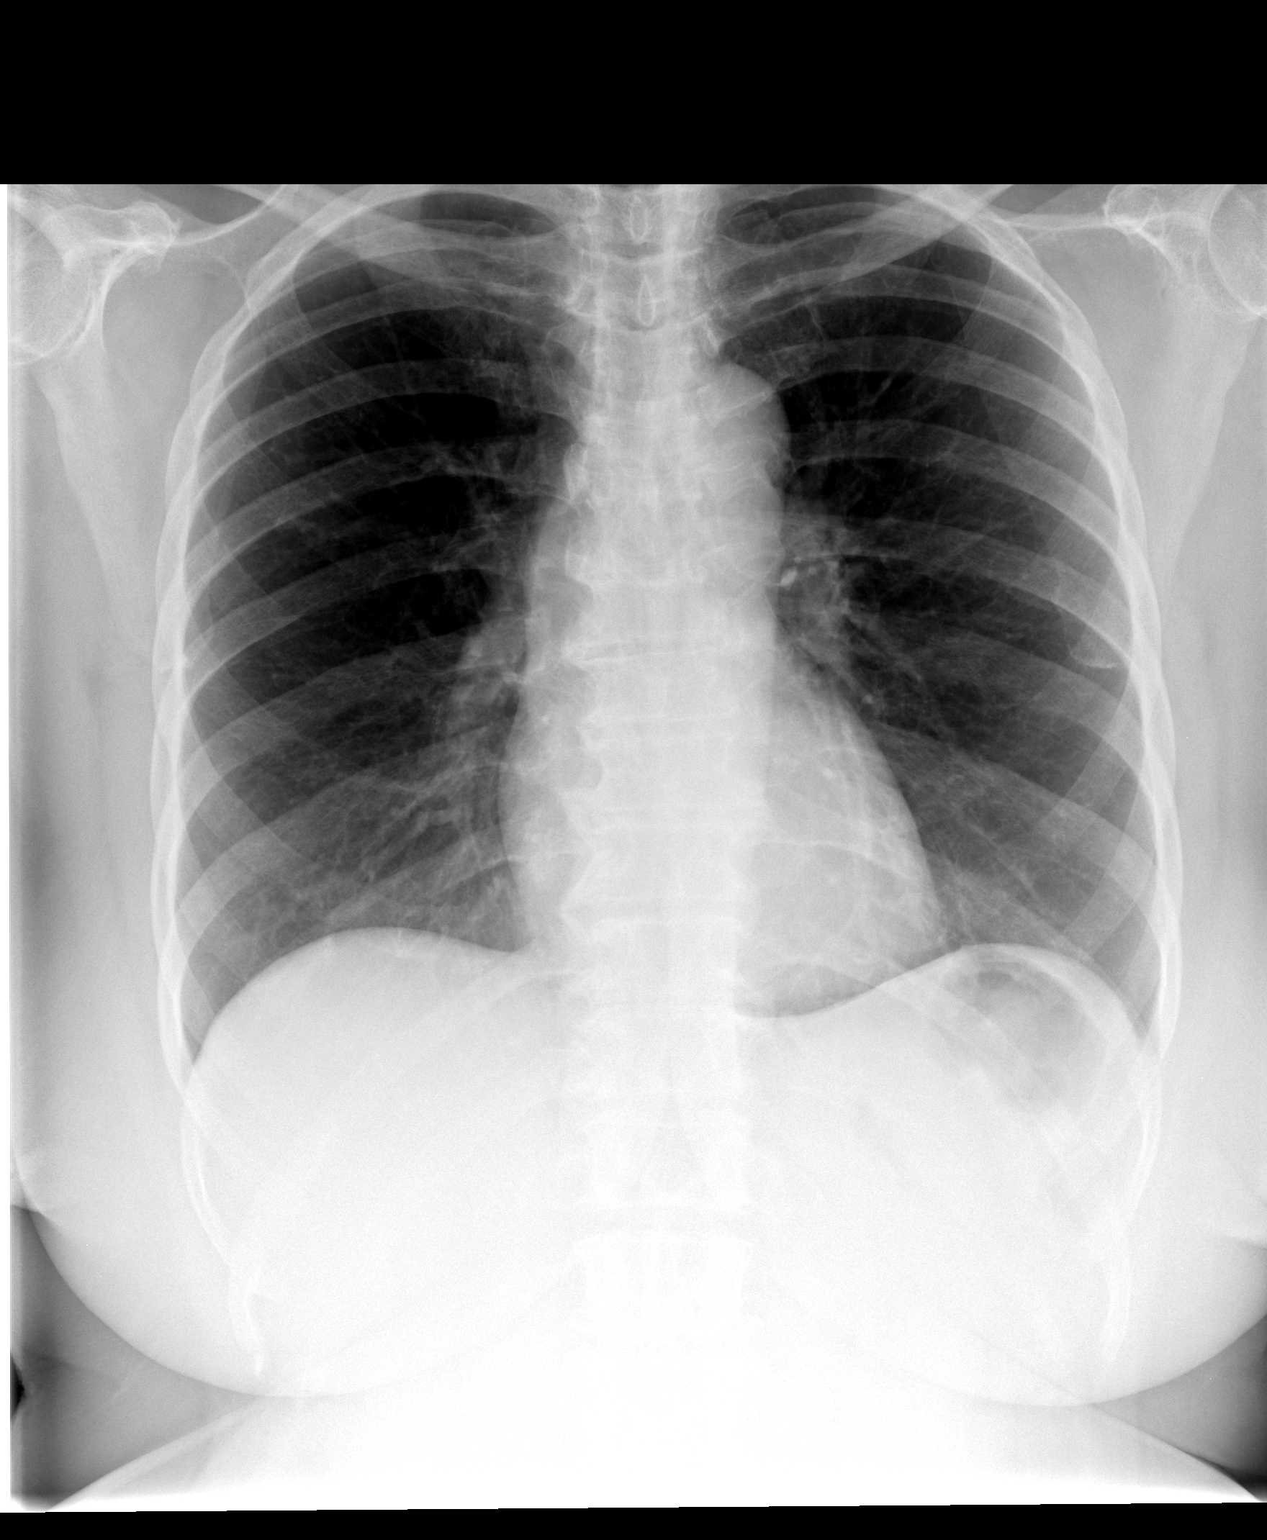

[view not recorded (2 of 2)]
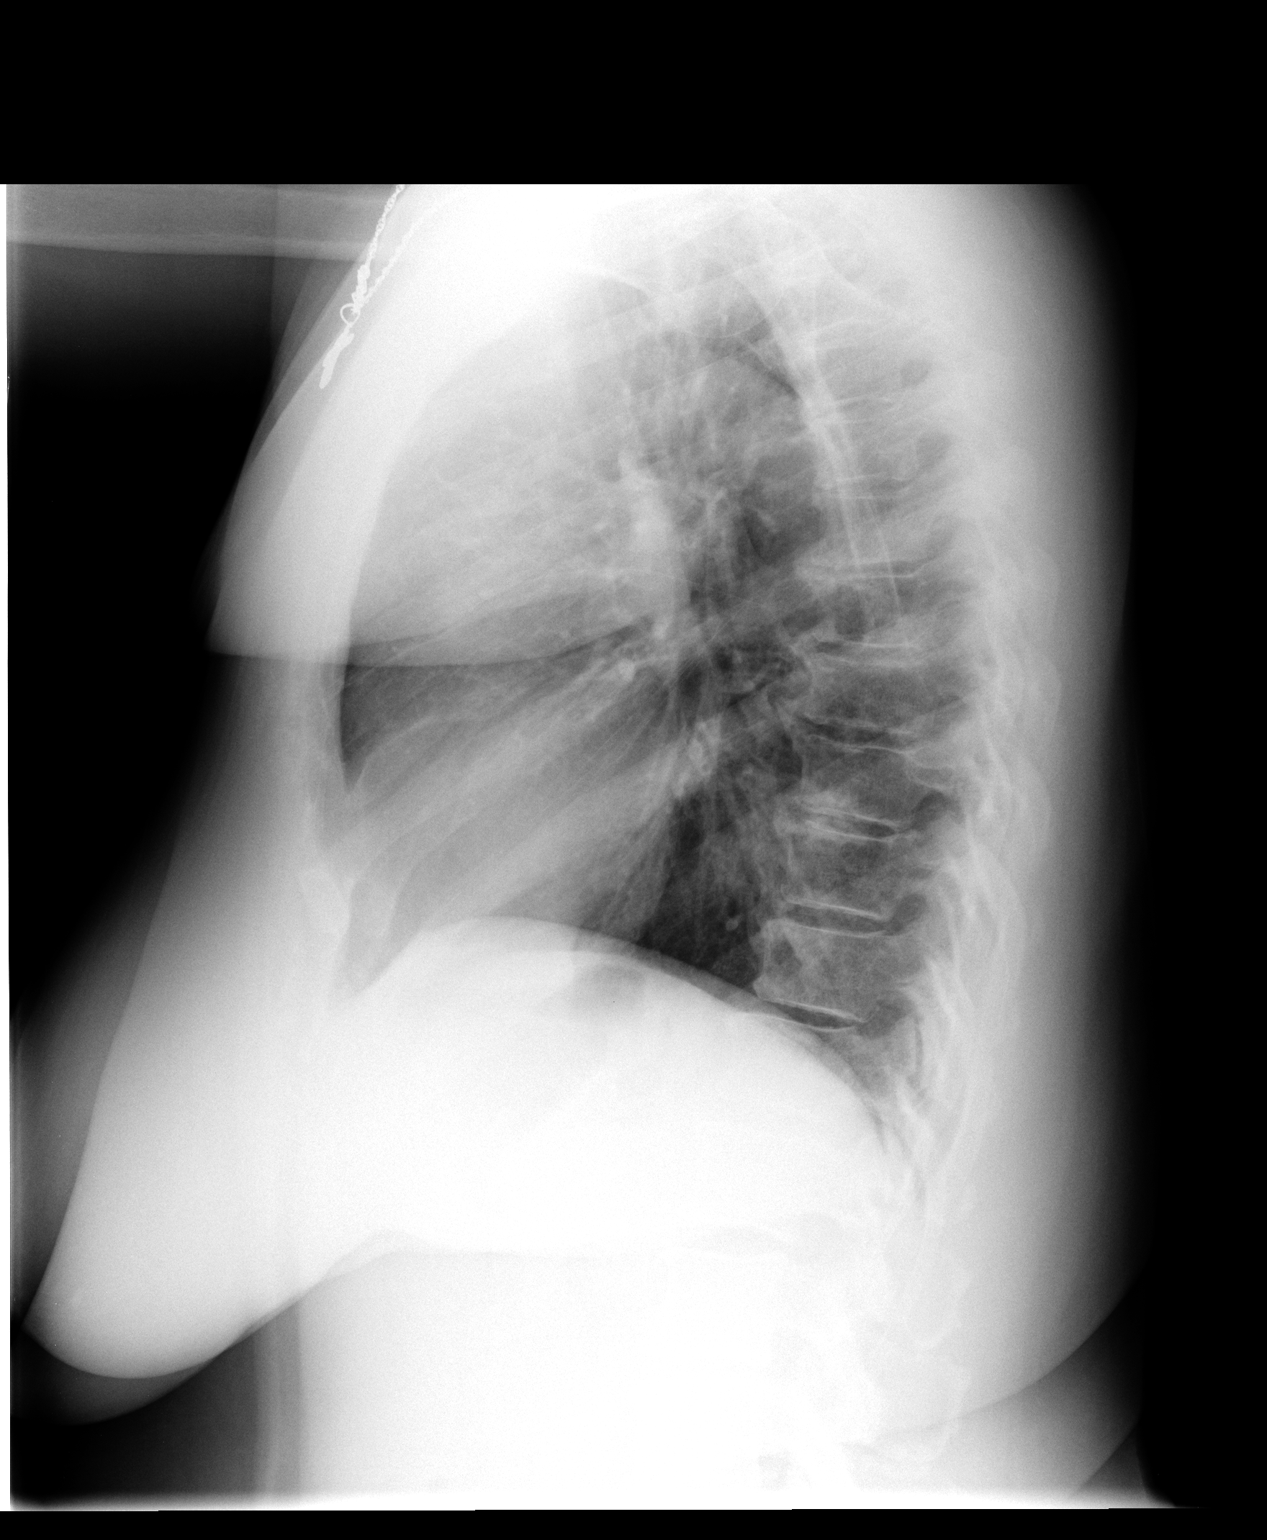

[2 of 2 positions shown; findings below may reference images not displayed]

FINDINGS: Cardiomediastinal silhouette unremarkable and unchanged.
Lungs clear.  Bronchovascular markings normal.  No pleural
effusions.  Degenerative changes involving the thoracic spine.  No
significant interval change.
IMPRESSION: No acute cardiopulmonary disease.  Stable chest x-ray.

## 2010-02-21 ENCOUNTER — Encounter: Admission: RE | Admit: 2010-02-21 | Discharge: 2010-02-21 | Payer: Self-pay | Admitting: Gastroenterology

## 2010-02-21 IMAGING — US US ABDOMEN COMPLETE
1 series · 14 of 25 positions shown · non-contrast
Comparison: None.

CLINICAL DATA: Abdominal pain

ABDOMINAL ULTRASOUND COMPLETE

[Series 1: us abdomen complete · 0.20mm/px · 14 of 89 slices shown]
[im 1/89]
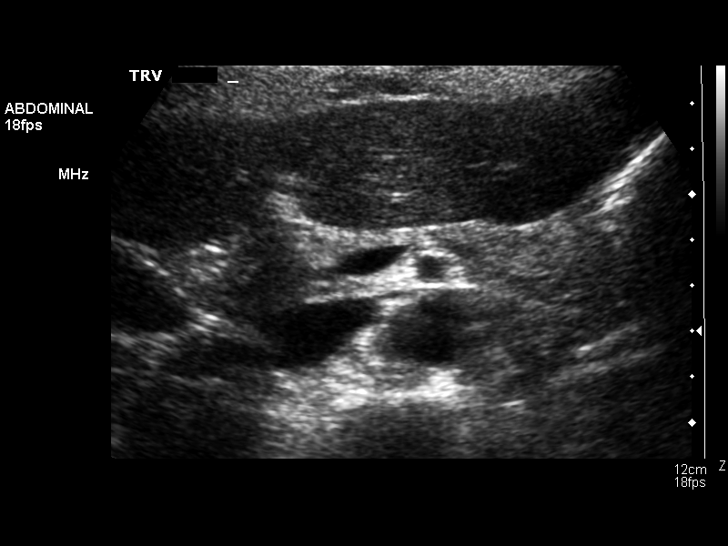
[im 8/89]
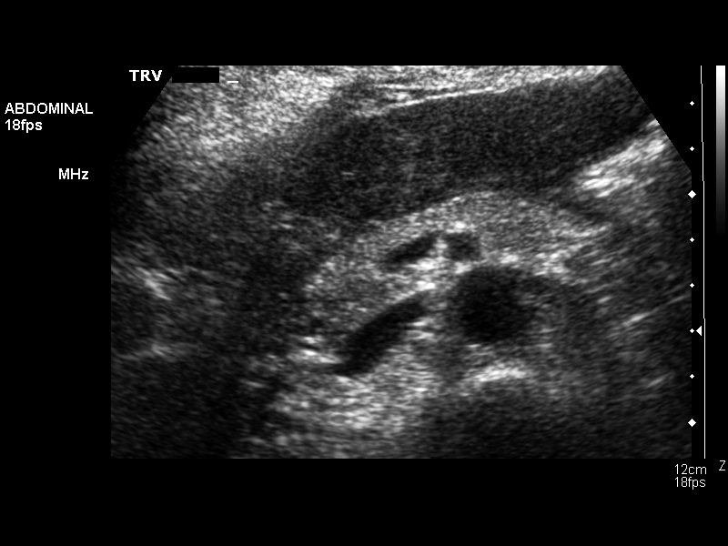
[im 15/89]
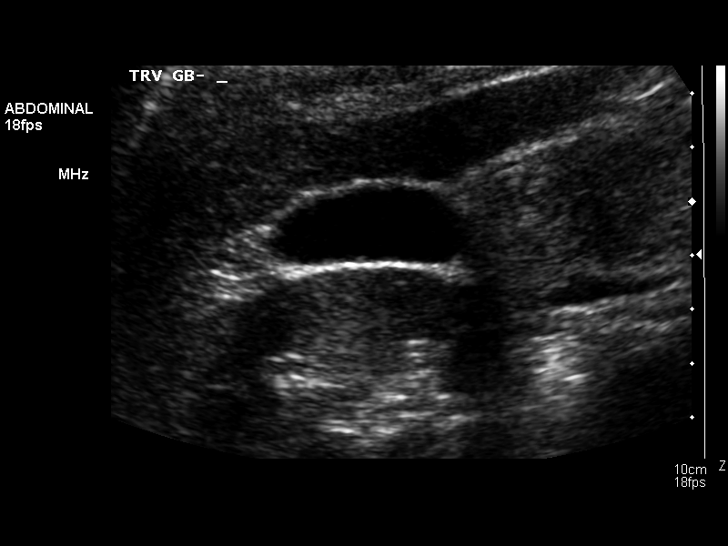
[im 23/89]
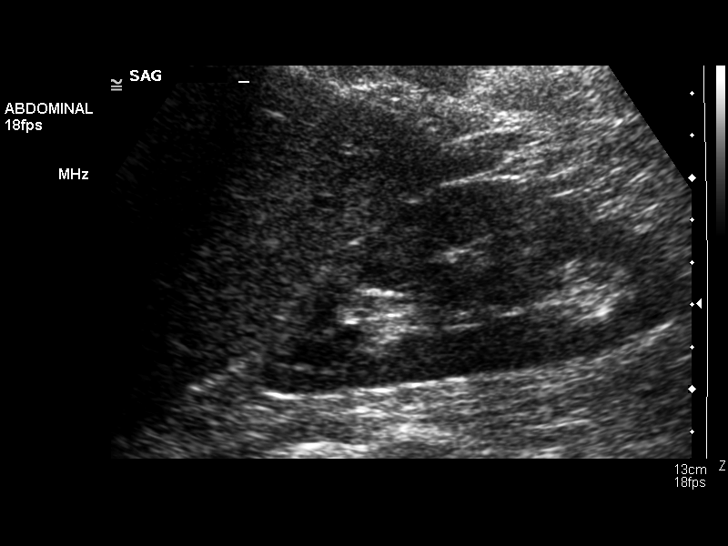
[im 30/89]
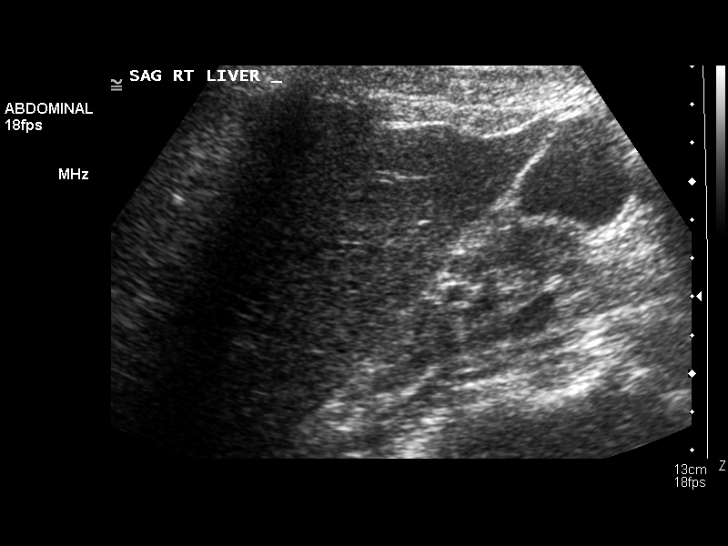
[im 34/89]
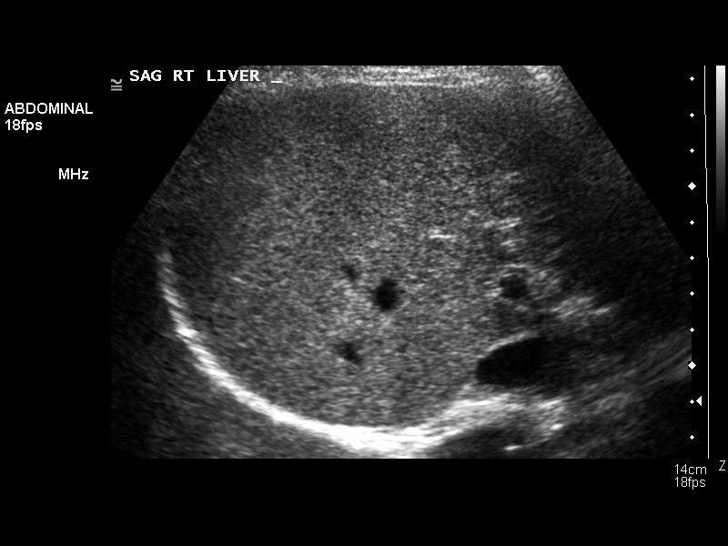
[im 41/89]
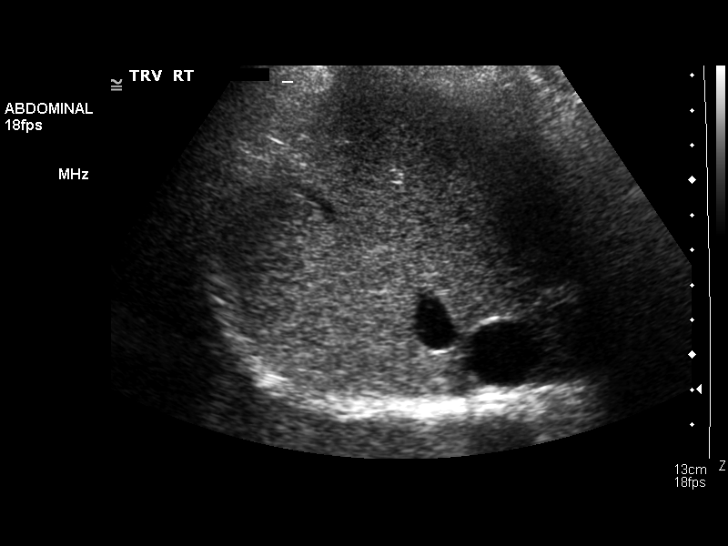
[im 48/89]
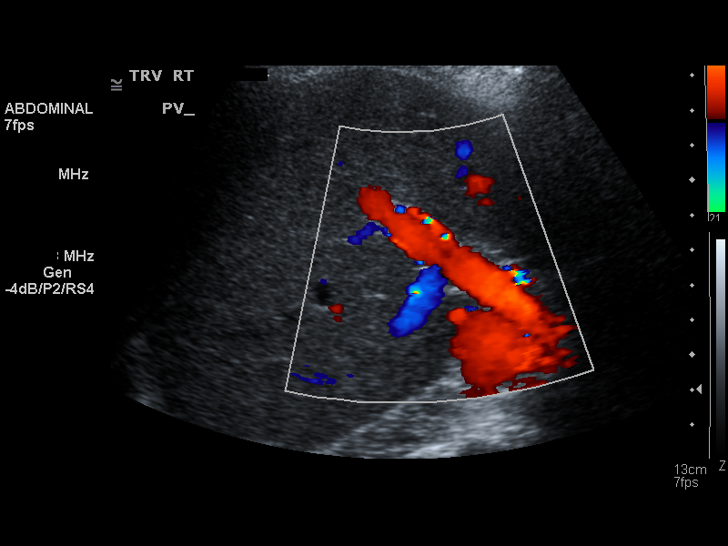
[im 56/89]
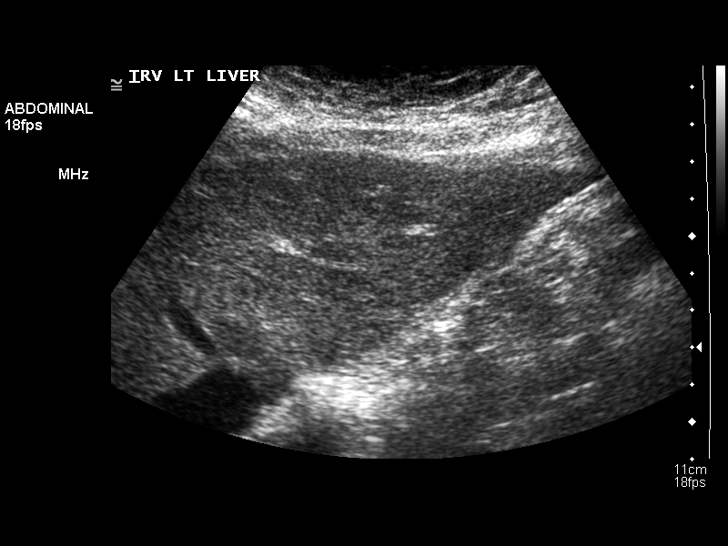
[im 59/89]
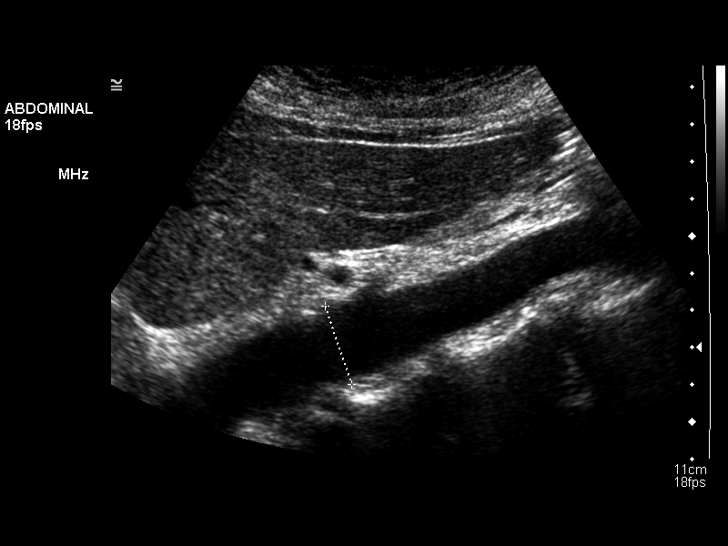
[im 67/89]
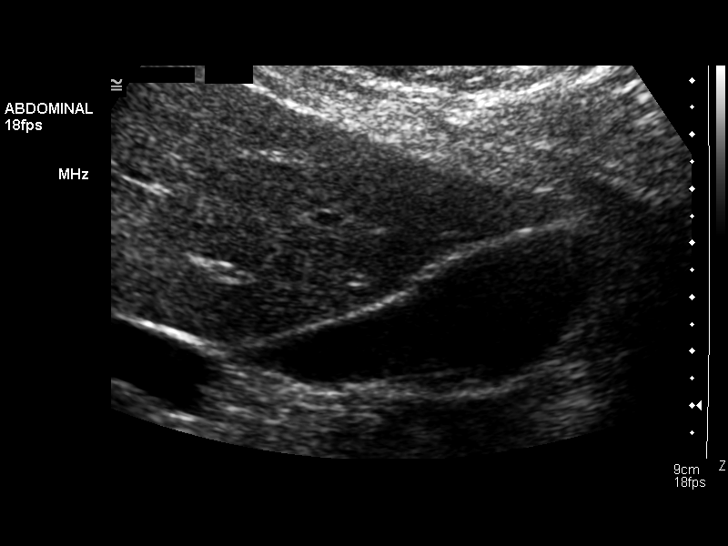
[im 74/89]
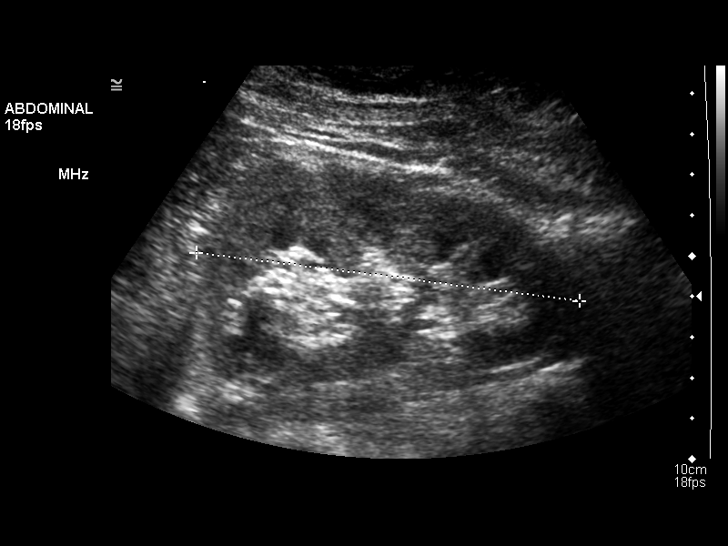
[im 81/89]
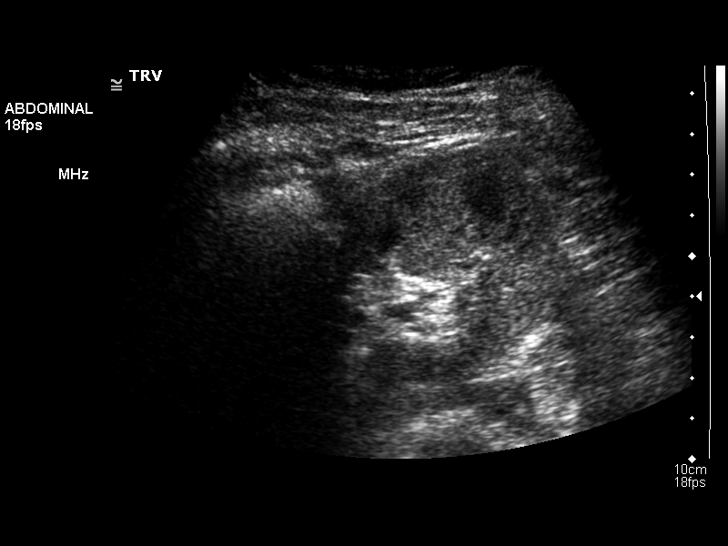
[im 89/89]
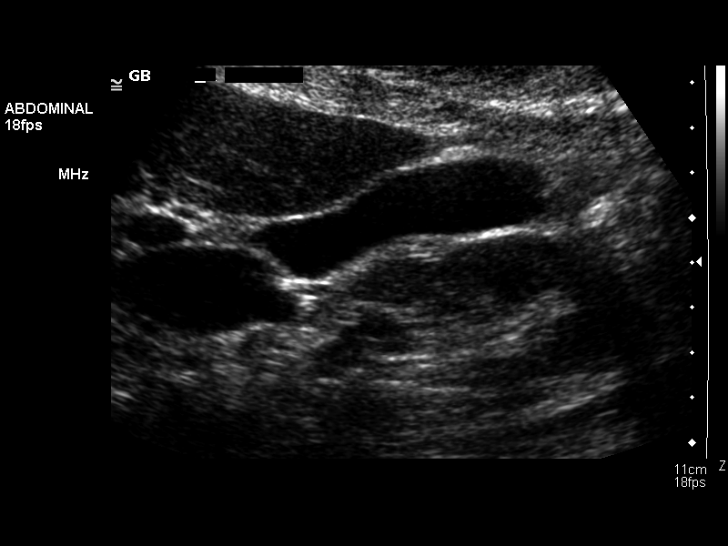

[14 of 25 positions shown; findings below may reference images not displayed]

FINDINGS: Gallbladder:  No gallstones, gallbladder wall thickening, or
pericholecystic fluid.

Common Bile Duct:  Within normal limits in caliber.

Liver: No focal mass lesion identified.  Within normal limits in
parenchymal echogenicity.

IVC:  Appears normal.

Pancreas:  No abnormality identified.

Spleen:  Within normal limits in size and echotexture.

Right kidney:  Normal in size and parenchymal echogenicity.  No
evidence of mass or hydronephrosis.

Left kidney:  Normal in size and parenchymal echogenicity.  No
evidence of mass or hydronephrosis.

Abdominal Aorta:  No aneurysm identified.
IMPRESSION: Negative abdominal ultrasound.

## 2010-06-09 ENCOUNTER — Ambulatory Visit (HOSPITAL_COMMUNITY)
Admission: RE | Admit: 2010-06-09 | Discharge: 2010-06-09 | Payer: Self-pay | Source: Home / Self Care | Attending: Internal Medicine | Admitting: Internal Medicine

## 2010-06-10 ENCOUNTER — Encounter: Payer: Self-pay | Admitting: Internal Medicine

## 2010-06-11 ENCOUNTER — Encounter: Payer: Self-pay | Admitting: Obstetrics & Gynecology

## 2010-07-03 ENCOUNTER — Other Ambulatory Visit: Payer: Self-pay | Admitting: Gastroenterology

## 2010-07-03 DIAGNOSIS — R634 Abnormal weight loss: Secondary | ICD-10-CM

## 2010-07-04 ENCOUNTER — Ambulatory Visit
Admission: RE | Admit: 2010-07-04 | Discharge: 2010-07-04 | Disposition: A | Discharge status: Home / Self Care | Payer: Medicare HMO | Source: Ambulatory Visit | Attending: Gastroenterology | Admitting: Gastroenterology

## 2010-07-04 DIAGNOSIS — R634 Abnormal weight loss: Secondary | ICD-10-CM

## 2010-07-04 IMAGING — CT CT ABD-PELV W/ CM
3 of 5 series · 13 of 36 positions shown, 19 images · IV contrast (READICAT/WATER & [ID] OMNI 300)
Comparison: [DATE]

CLINICAL DATA: Weight loss.  Abdominal pain.  Pelvic pain for 2
weeks.  History of IBS, constipation.  Prior TAH-BSO.

CT ABDOMEN AND PELVIS WITH CONTRAST
TECHNIQUE: Multidetector CT imaging of the abdomen and pelvis was
performed following the standard protocol during bolus
administration of intravenous contrast.
Contrast: 100 ml [1P]

[Series 3: routine abdomen · axial · 0.78mm/px · z∈[-276,+9]mm · 7 of 76 slices shown, 12 images]
[im 10/76  soft-tissue]
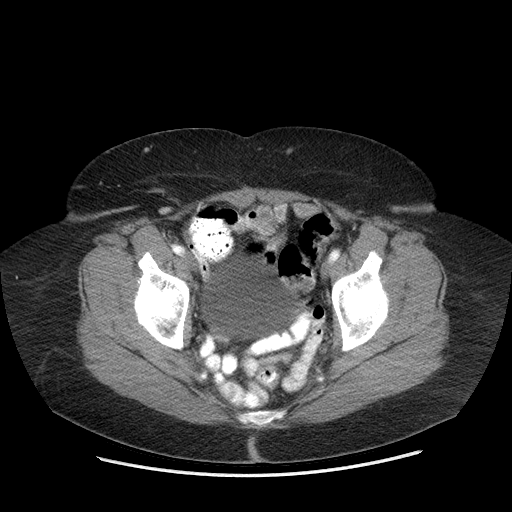
[im 10/76  bone]
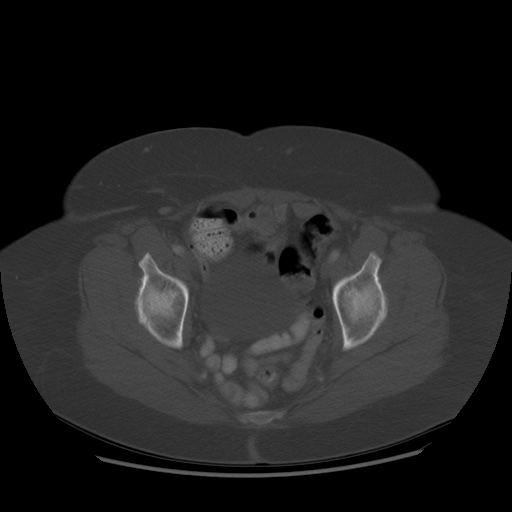
[im 19/76  soft-tissue]
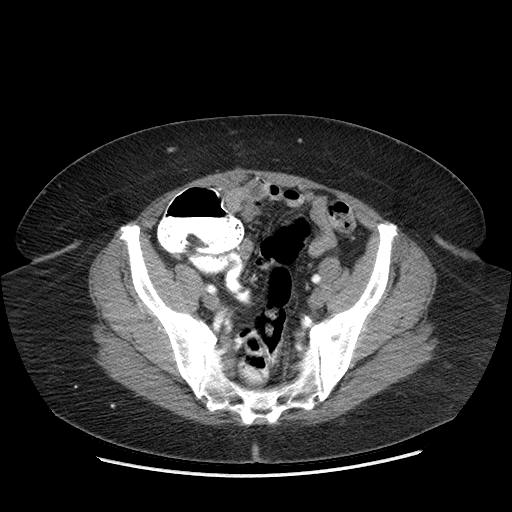
[im 29/76  soft-tissue]
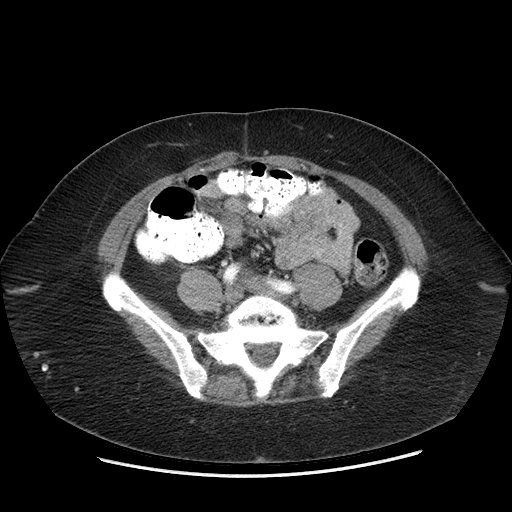
[im 38/76  soft-tissue]
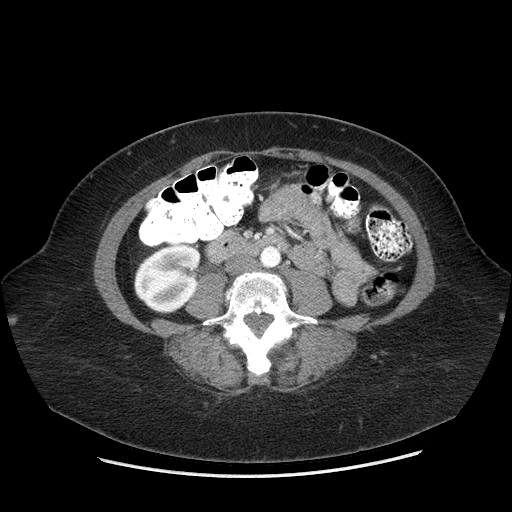
[im 38/76  lung]
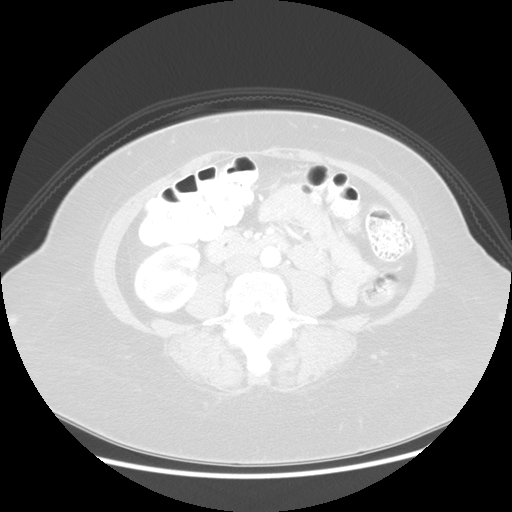
[im 47/76  soft-tissue]
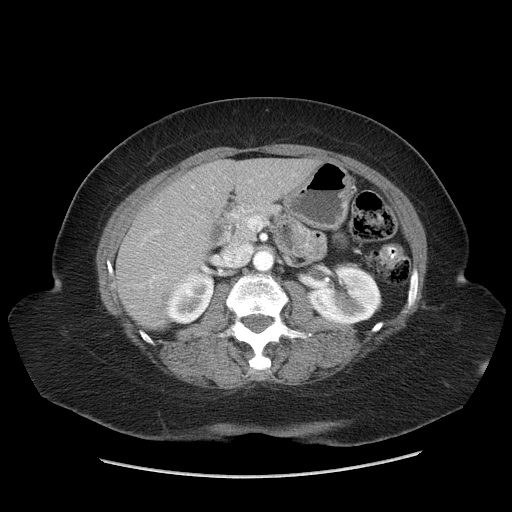
[im 47/76  lung]
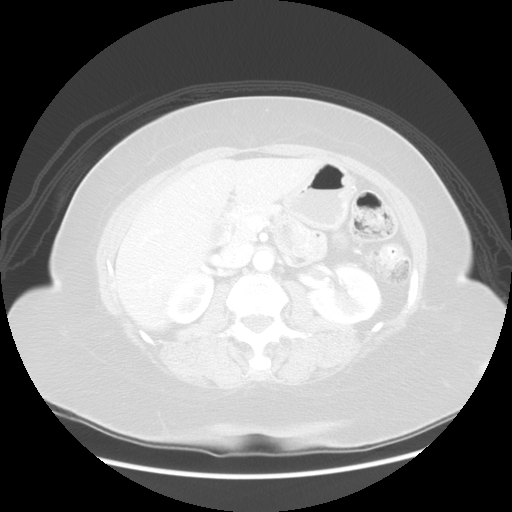
[im 57/76  soft-tissue]
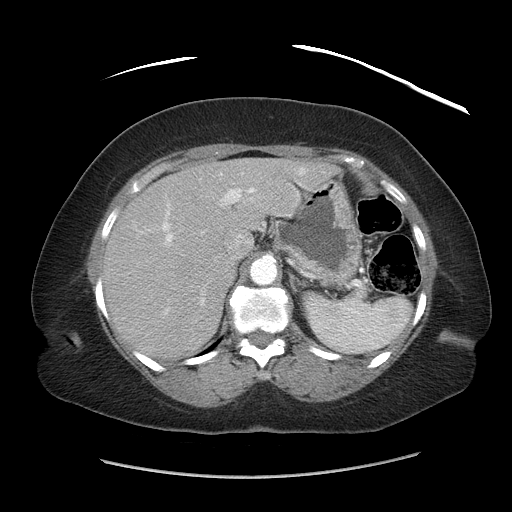
[im 57/76  lung]
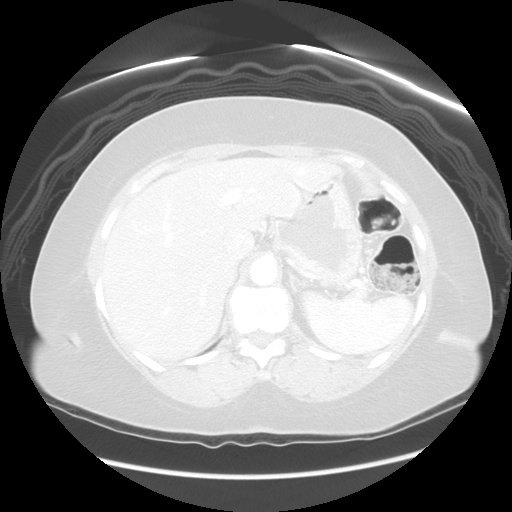
[im 66/76  soft-tissue]
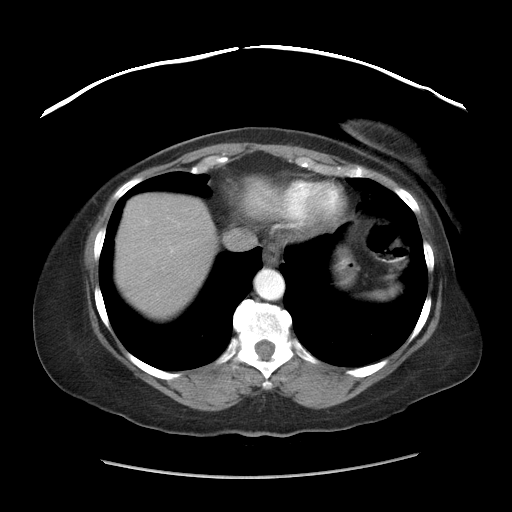
[im 66/76  lung]
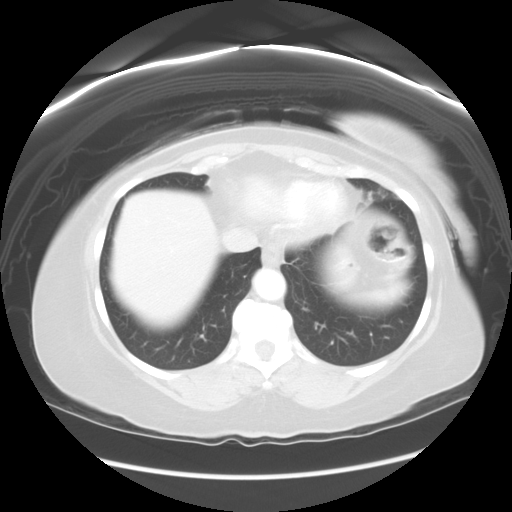

[Series 601: coronal body · coronal · 0.91mm/px · 1 of 121 slices shown, 2 images]
[im 41/121  soft-tissue]
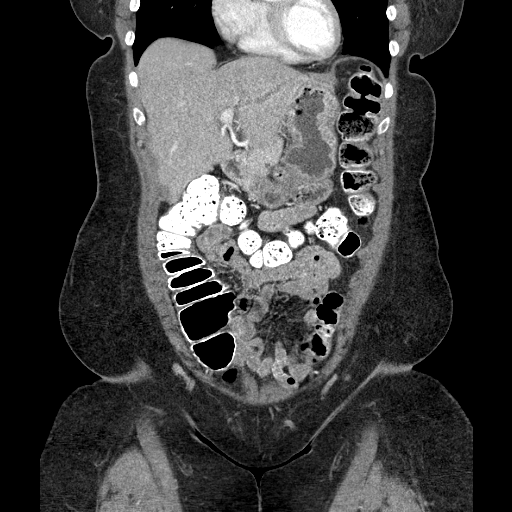
[im 41/121  bone]
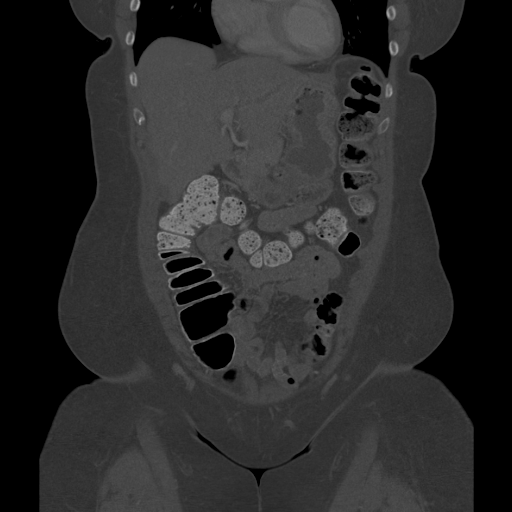

[Series 602: sagittal body · sagittal · 0.91mm/px · 5 of 161 slices shown]
[im 19/161  soft-tissue]
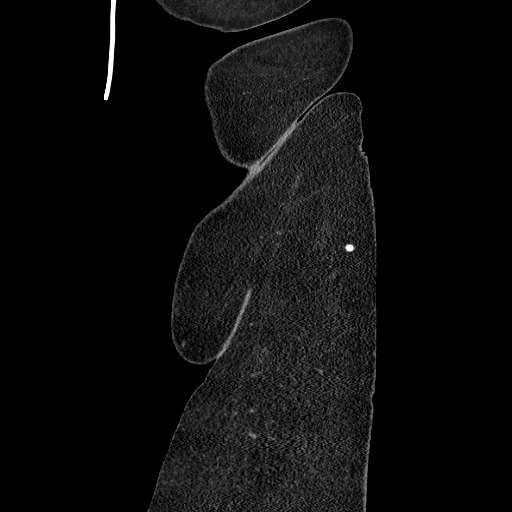
[im 38/161  soft-tissue]
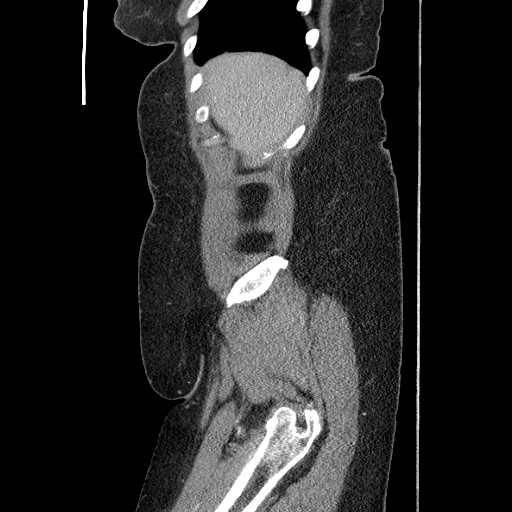
[im 57/161  soft-tissue]
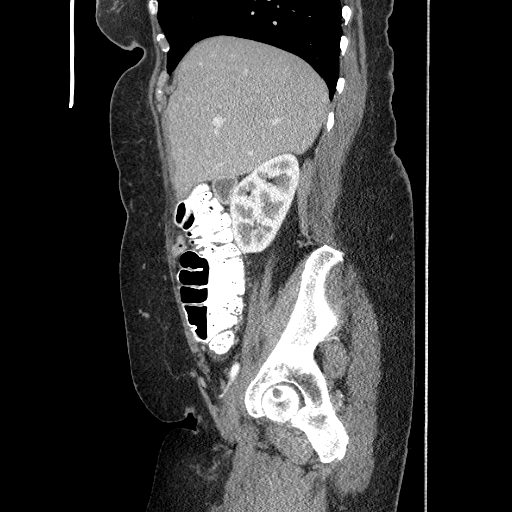
[im 76/161  soft-tissue]
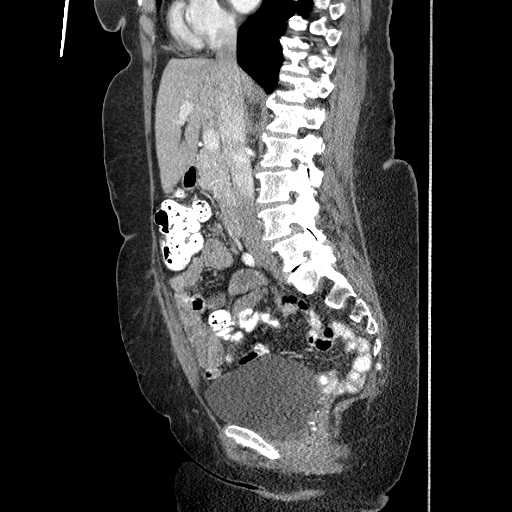
[im 95/161  soft-tissue]
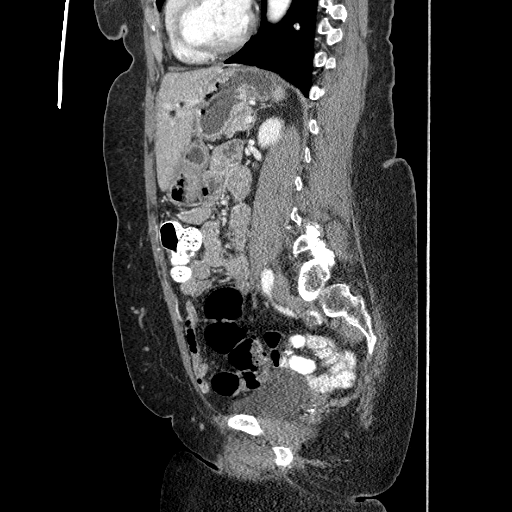

[13 of 36 positions shown; findings below may reference images not displayed]

FINDINGS: Lung bases are unremarkable.  No focal abnormality is
identified within the liver, spleen, pancreas, adrenal glands, or
kidneys.  The gallbladder is present and contains an enhancing
wall.  No calcified gallstones are identified.

The appendix is well seen and has a normal appearance.  Visualized
bowel loops are normal in caliber and wall thickness.

The uterus is absent.  No adnexal mass, free pelvic fluid
identified.

Degenerative changes are seen in the spine.  There is
anterolisthesis of L4 on L5.  Significant disc height loss of L5
S1.  Degenerative changes are seen in the lower thoracic spine.
IMPRESSION: 1.  No evidence for acute appendicitis.
2.  Gallbladder wall enhancement which can be associated with
gallbladder wall thickening.  Further evaluation with ultrasound
may be helpful.
3.  No bowel obstruction.

## 2010-07-04 MED ORDER — IOHEXOL 300 MG/ML  SOLN
100.0000 mL | Freq: Once | INTRAMUSCULAR | Status: AC | PRN
  Administered 2010-07-04: 100 mL via INTRAVENOUS

## 2010-07-19 ENCOUNTER — Emergency Department (HOSPITAL_COMMUNITY)
Admission: EM | Admit: 2010-07-19 | Discharge: 2010-07-19 | Disposition: A | Discharge status: Home / Self Care | Payer: Medicare HMO | Attending: Emergency Medicine | Admitting: Emergency Medicine

## 2010-07-19 ENCOUNTER — Emergency Department (HOSPITAL_COMMUNITY): Payer: Medicare HMO

## 2010-07-19 DIAGNOSIS — I10 Essential (primary) hypertension: Secondary | ICD-10-CM | POA: Insufficient documentation

## 2010-07-19 DIAGNOSIS — R109 Unspecified abdominal pain: Secondary | ICD-10-CM | POA: Insufficient documentation

## 2010-07-19 DIAGNOSIS — R9431 Abnormal electrocardiogram [ECG] [EKG]: Secondary | ICD-10-CM | POA: Insufficient documentation

## 2010-07-19 LAB — COMPREHENSIVE METABOLIC PANEL
ALT: 17 U/L (ref 0–35)
AST: 27 U/L (ref 0–37)
Albumin: 3.8 g/dL (ref 3.5–5.2)
Alkaline Phosphatase: 78 U/L (ref 39–117)
BUN: 11 mg/dL (ref 6–23)
CO2: 24 mEq/L (ref 19–32)
Calcium: 9.4 mg/dL (ref 8.4–10.5)
Chloride: 105 mEq/L (ref 96–112)
Creatinine, Ser: 0.75 mg/dL (ref 0.4–1.2)
GFR calc Af Amer: >60 mL/min (ref >60)
GFR calc non Af Amer: >60 mL/min (ref >60)
Glucose, Bld: 97 mg/dL (ref 70–99)
Potassium: 3.7 mEq/L (ref 3.5–5.1)
Sodium: 138 mEq/L (ref 135–145)
Total Bilirubin: 0.5 mg/dL (ref 0.3–1.2)
Total Protein: 7.6 g/dL (ref 6.0–8.3)

## 2010-07-19 LAB — POCT I-STAT, CHEM 8
BUN: 11 mg/dL (ref 6–23)
Calcium, Ion: 1.1 mmol/L — ABNORMAL LOW (ref 1.12–1.32)
Chloride: 103 mEq/L (ref 96–112)
Creatinine, Ser: 0.8 mg/dL (ref 0.4–1.2)
Glucose, Bld: 100 mg/dL — ABNORMAL HIGH (ref 70–99)
HCT: 44 % (ref 36.0–46.0)
Hemoglobin: 15 g/dL (ref 12.0–15.0)
Potassium: 3.8 mEq/L (ref 3.5–5.1)
Sodium: 140 mEq/L (ref 135–145)
TCO2: 25 mmol/L (ref 0–100)

## 2010-07-19 LAB — DIFFERENTIAL
Basophils Absolute: 0 10*3/uL (ref 0.0–0.1)
Basophils Relative: 0 % (ref 0–1)
Eosinophils Absolute: 0.5 10*3/uL (ref 0.0–0.7)
Eosinophils Relative: 5 % (ref 0–5)
Lymphocytes Relative: 31 % (ref 12–46)
Lymphs Abs: 3.2 10*3/uL (ref 0.7–4.0)
Monocytes Absolute: 0.7 10*3/uL (ref 0.1–1.0)
Monocytes Relative: 7 % (ref 3–12)
Neutro Abs: 6 10*3/uL (ref 1.7–7.7)
Neutrophils Relative %: 57 % (ref 43–77)

## 2010-07-19 LAB — POCT CARDIAC MARKERS
CKMB, poc: 2.5 ng/mL (ref 1.0–8.0)
CKMB, poc: 2.6 ng/mL (ref 1.0–8.0)
Myoglobin, poc: 105 ng/mL (ref 12–200)
Myoglobin, poc: 115 ng/mL (ref 12–200)
Troponin i, poc: <0.05 ng/mL (ref 0.00–0.09)
Troponin i, poc: <0.05 ng/mL (ref 0.00–0.09)

## 2010-07-19 LAB — CBC
HCT: 41.2 % (ref 36.0–46.0)
Hemoglobin: 13.7 g/dL (ref 12.0–15.0)
MCH: 30 pg (ref 26.0–34.0)
MCHC: 33.3 g/dL (ref 30.0–36.0)
MCV: 90.2 fL (ref 78.0–100.0)
Platelets: 220 10*3/uL (ref 150–400)
RBC: 4.57 MIL/uL (ref 3.87–5.11)
RDW: 13.9 % (ref 11.5–15.5)
WBC: 10.4 10*3/uL (ref 4.0–10.5)

## 2010-07-19 LAB — URINALYSIS, ROUTINE W REFLEX MICROSCOPIC
Bilirubin Urine: NEGATIVE
Hgb urine dipstick: NEGATIVE
Ketones, ur: NEGATIVE mg/dL
Nitrite: NEGATIVE
Protein, ur: NEGATIVE mg/dL
Specific Gravity, Urine: 1.01 (ref 1.005–1.030)
Urine Glucose, Fasting: NEGATIVE mg/dL
Urobilinogen, UA: 0.2 mg/dL (ref 0.0–1.0)
pH: 6.5 (ref 5.0–8.0)

## 2010-07-19 LAB — LIPASE, BLOOD: Lipase: 26 U/L (ref 11–59)

## 2010-07-19 IMAGING — CR DG ABDOMEN ACUTE W/ 1V CHEST
4 series · 4 of 4 positions shown · non-contrast
Comparison: CT [DATE]

CLINICAL DATA: Abdominal pain.

ACUTE ABDOMEN SERIES (ABDOMEN 2 VIEW & CHEST 1 VIEW)

[w chest pa]
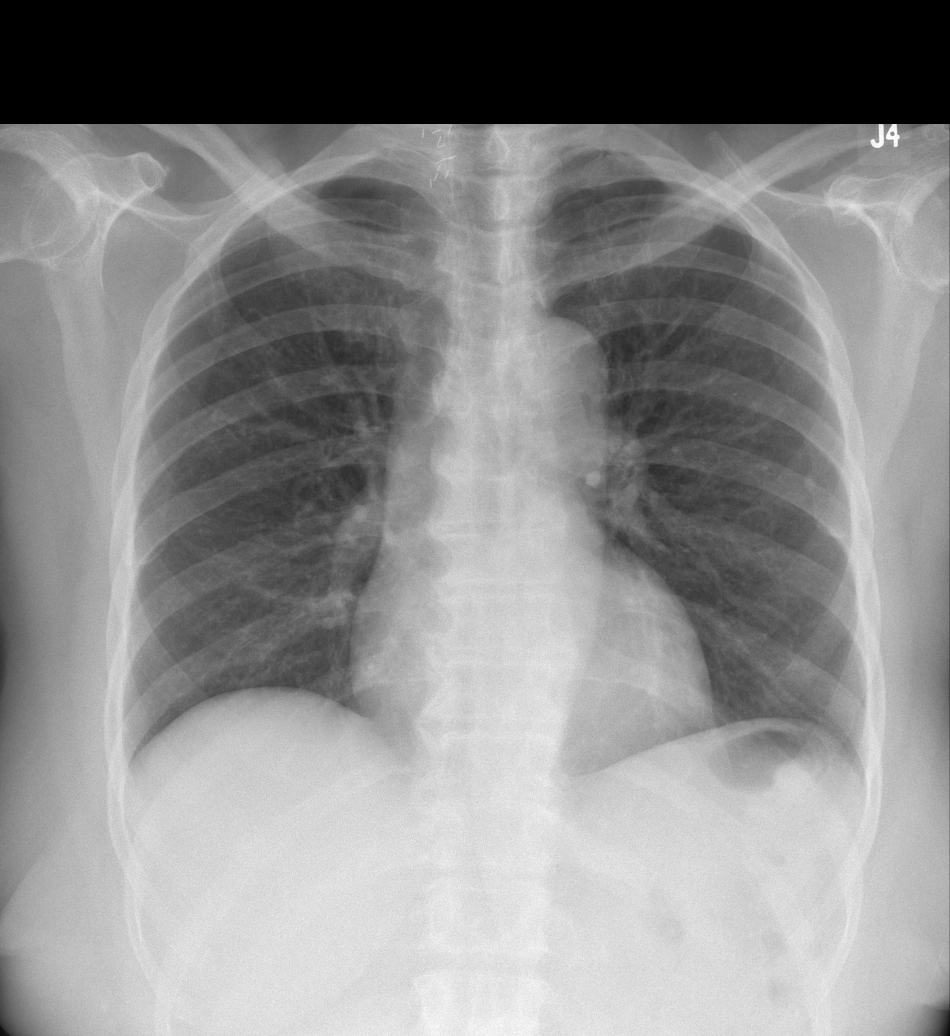

[w abdomen upright *]
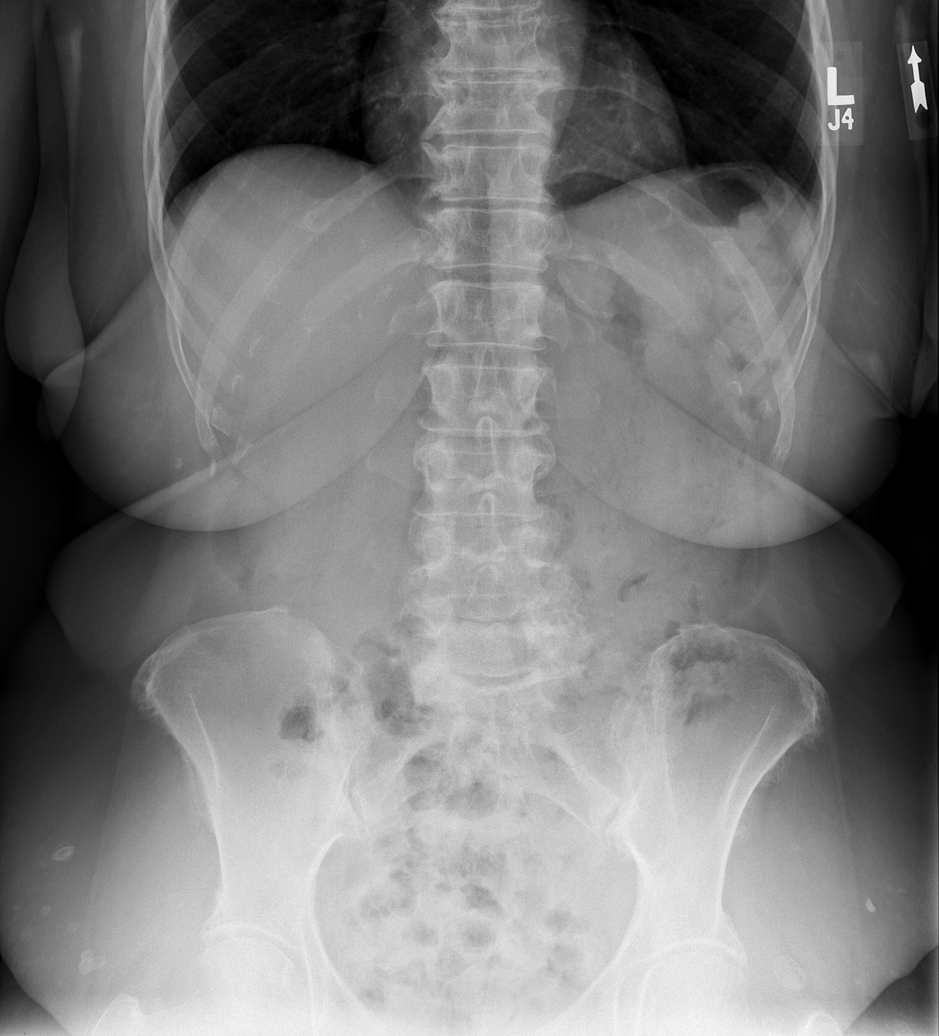

[t abdomen supine (1 of 2)]
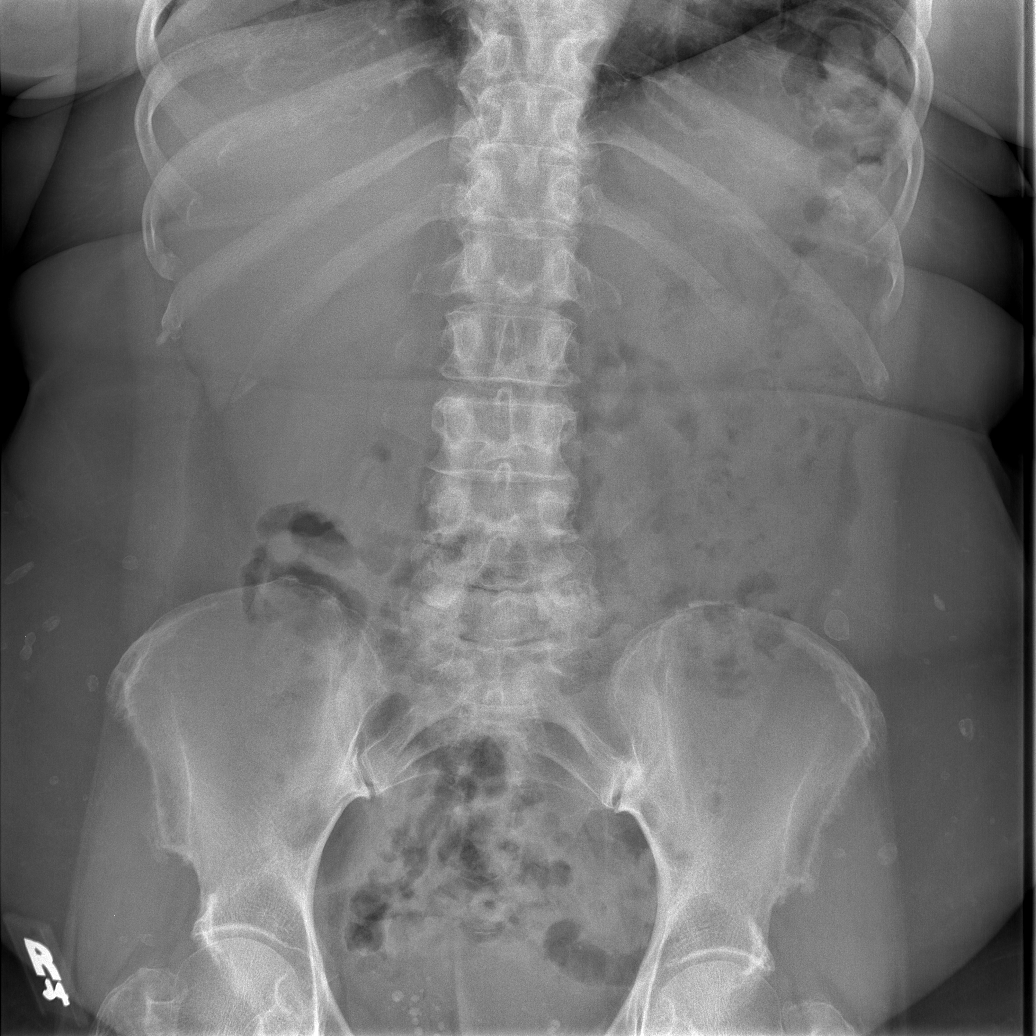

[t abdomen supine (2 of 2)]
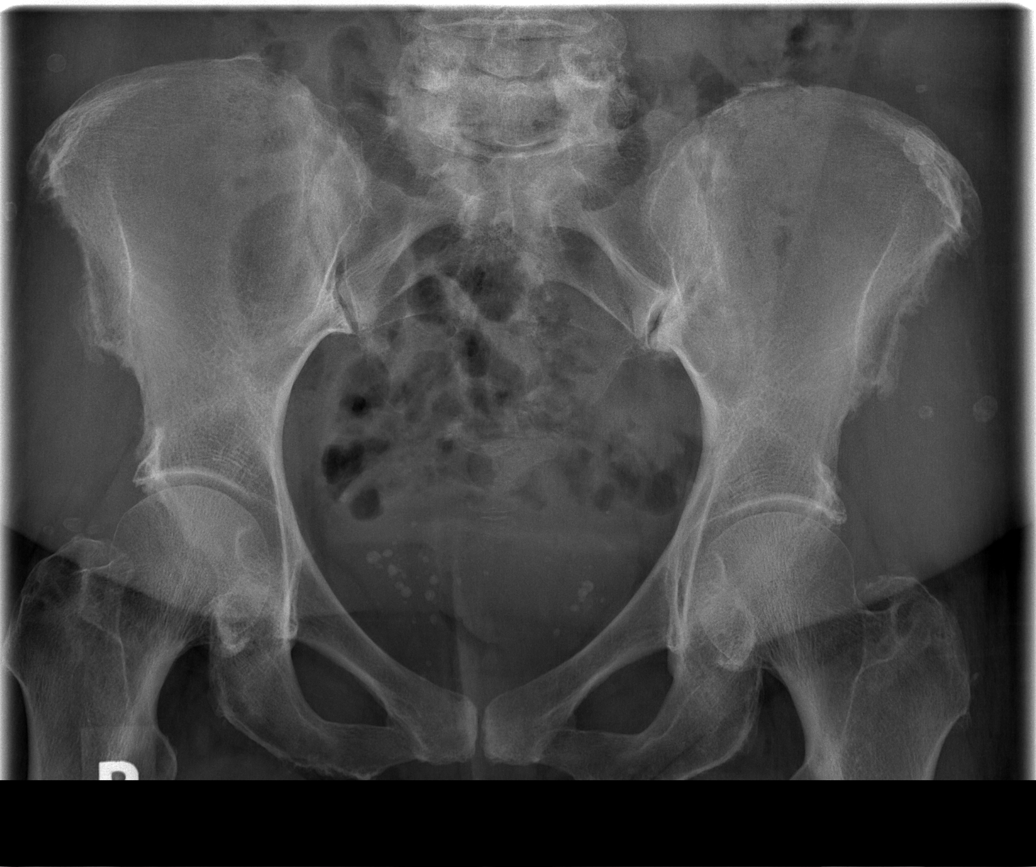

[4 of 4 positions shown; findings below may reference images not displayed]

FINDINGS: Bowel gas pattern does not show evidence of ileus,
obstruction or free air.  Normal volume of fecal matter.  There are
phleboliths in the pelvis.  No worrisome soft tissue
calcifications.  There is ordinary degenerative disease of the
spine and sacroiliac joints.

One-view chest shows normal heart and mediastinal shadows.  Lungs
are clear.  No free air seen under the diaphragm.  There are
surgical clips in the right neck.
IMPRESSION: Negative acute abdominal series.  Bowel gas pattern unremarkable.

## 2010-08-07 ENCOUNTER — Encounter (HOSPITAL_COMMUNITY)
Admission: RE | Admit: 2010-08-07 | Discharge: 2010-08-07 | Disposition: A | Discharge status: Home / Self Care | Payer: Medicare HMO | Source: Ambulatory Visit | Attending: Gastroenterology | Admitting: Gastroenterology

## 2010-08-07 DIAGNOSIS — R109 Unspecified abdominal pain: Secondary | ICD-10-CM | POA: Insufficient documentation

## 2010-08-07 DIAGNOSIS — R110 Nausea: Secondary | ICD-10-CM | POA: Insufficient documentation

## 2010-08-07 IMAGING — NM NM HEPATO W/GB/PHARM/[PERSON_NAME]
2 series · 12 of 12 positions shown · non-contrast
Comparison: CT [DATE]

CLINICAL DATA: Abdominal pain, nausea.

NUCLEAR MEDICINE HEPATOBILIARY IMAGING WITH GALLBLADDER EF
TECHNIQUE: Sequential images of the abdomen were obtained [DATE]
minutes following intravenous administration of
radiopharmaceutical.  After slow intravenous infusion of [OA]
Cholecystokinin, gallbladder ejection fraction was determined.
Radiopharmaceutical:  [OA] [OA] Choletec

[Series 1: he hepato · 4.75mm/px · 6 of 30 frames shown (1 of 2)]
[frame 3/30]
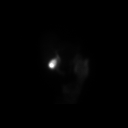
[frame 8/30]
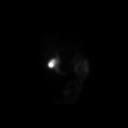
[frame 13/30]
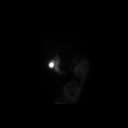
[frame 18/30]
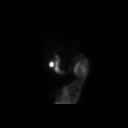
[frame 23/30]
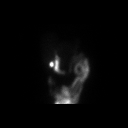
[frame 28/30]
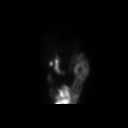

[Series 1: he hepato · 4.75mm/px · 6 of 60 frames shown (2 of 2)]
[frame 6/60]
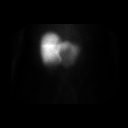
[frame 16/60]
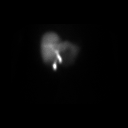
[frame 26/60]
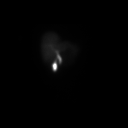
[frame 36/60]
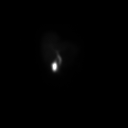
[frame 46/60]
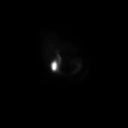
[frame 56/60]
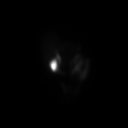

[12 of 12 positions shown; findings below may reference images not displayed]

FINDINGS: There is prompt uptake and excretion of radiotracer by
the liver.  No evidence of cystic duct or common duct obstruction.
Gallbladder ejection fraction is 92%.  Normal is greater than 30%.

The patient did not experience symptoms during CCK infusion.
IMPRESSION: Normal study.

## 2010-08-07 MED ORDER — TECHNETIUM TC 99M MEBROFENIN IV KIT
4.5000 | PACK | Freq: Once | INTRAVENOUS | Status: AC | PRN
  Administered 2010-08-07: 5 via INTRAVENOUS

## 2010-08-07 MED ORDER — SINCALIDE 5 MCG IJ SOLR: 0.0200 ug/kg | Freq: Once | INTRAMUSCULAR | Status: DC

## 2010-08-26 LAB — DIFFERENTIAL
Basophils Absolute: 0 10*3/uL (ref 0.0–0.1)
Basophils Relative: 0 % (ref 0–1)
Eosinophils Absolute: 0.4 10*3/uL (ref 0.0–0.7)
Eosinophils Relative: 4 % (ref 0–5)
Lymphocytes Relative: 25 % (ref 12–46)
Lymphs Abs: 3 10*3/uL (ref 0.7–4.0)
Monocytes Absolute: 0.8 10*3/uL (ref 0.1–1.0)
Monocytes Relative: 7 % (ref 3–12)
Neutro Abs: 7.7 10*3/uL (ref 1.7–7.7)
Neutrophils Relative %: 64 % (ref 43–77)

## 2010-08-26 LAB — CBC
HCT: 42.1 % (ref 36.0–46.0)
Hemoglobin: 14.3 g/dL (ref 12.0–15.0)
MCHC: 34 g/dL (ref 30.0–36.0)
MCV: 90.9 fL (ref 78.0–100.0)
Platelets: 280 10*3/uL (ref 150–400)
RBC: 4.63 MIL/uL (ref 3.87–5.11)
RDW: 14 % (ref 11.5–15.5)
WBC: 12 10*3/uL — ABNORMAL HIGH (ref 4.0–10.5)

## 2010-08-26 LAB — BASIC METABOLIC PANEL
BUN: 6 mg/dL (ref 6–23)
CO2: 24 mEq/L (ref 19–32)
Calcium: 9.3 mg/dL (ref 8.4–10.5)
Chloride: 102 mEq/L (ref 96–112)
Creatinine, Ser: 0.64 mg/dL (ref 0.4–1.2)
GFR calc Af Amer: >60 mL/min (ref >60)
GFR calc non Af Amer: >60 mL/min (ref >60)
Glucose, Bld: 107 mg/dL — ABNORMAL HIGH (ref 70–99)
Potassium: 3.3 mEq/L — ABNORMAL LOW (ref 3.5–5.1)
Sodium: 138 mEq/L (ref 135–145)

## 2010-08-30 LAB — GLUCOSE, CAPILLARY: Glucose-Capillary: 79 mg/dL (ref 70–99)

## 2010-08-31 LAB — URINALYSIS, ROUTINE W REFLEX MICROSCOPIC
Bilirubin Urine: NEGATIVE
Glucose, UA: NEGATIVE mg/dL
Hgb urine dipstick: NEGATIVE
Ketones, ur: NEGATIVE mg/dL
Nitrite: NEGATIVE
Protein, ur: NEGATIVE mg/dL
Specific Gravity, Urine: 1.005 (ref 1.005–1.030)
Urobilinogen, UA: 0.2 mg/dL (ref 0.0–1.0)
pH: 7 (ref 5.0–8.0)

## 2010-08-31 LAB — BASIC METABOLIC PANEL
BUN: 8 mg/dL (ref 6–23)
CO2: 29 mEq/L (ref 19–32)
Calcium: 9.8 mg/dL (ref 8.4–10.5)
Chloride: 100 mEq/L (ref 96–112)
Creatinine, Ser: 0.64 mg/dL (ref 0.4–1.2)
GFR calc Af Amer: >60 mL/min (ref >60)
GFR calc non Af Amer: >60 mL/min (ref >60)
Glucose, Bld: 85 mg/dL (ref 70–99)
Potassium: 4 mEq/L (ref 3.5–5.1)
Sodium: 137 mEq/L (ref 135–145)

## 2010-08-31 LAB — CBC
HCT: 43.9 % (ref 36.0–46.0)
Hemoglobin: 15.1 g/dL — ABNORMAL HIGH (ref 12.0–15.0)
MCHC: 34.3 g/dL (ref 30.0–36.0)
MCV: 91.5 fL (ref 78.0–100.0)
Platelets: 303 10*3/uL (ref 150–400)
RBC: 4.8 MIL/uL (ref 3.87–5.11)
RDW: 13.8 % (ref 11.5–15.5)
WBC: 12.2 10*3/uL — ABNORMAL HIGH (ref 4.0–10.5)

## 2010-10-03 NOTE — Consult Note (Signed)
NAMEAKANE, TESSIER NO.:  [id number]   MEDICAL RECORD NO.:  [id number]          PATIENT TYPE:  EMS   LOCATION:  ED                           FACILITY:  [facility]   PHYSICIAN:  Kristine Garbe. Ezzard Standing, M.D.DATE OF BIRTH:  [birth year]   DATE OF CONSULTATION:  01/03/2009  DATE OF DISCHARGE:  01/03/2009                                 CONSULTATION   REASON FOR CONSULT:  Evaluate the patient with left floor of mouth  infection.   HISTORY:  Angelica [name] is a 70 year old female who has had a sore  tongue left floor of mouth now for 2 days.  She was seen in the  emergency room yesterday, was treated and sent home, and came back  because of increased swelling and pain discomfort.  She had a CT scan of  her neck, which shows diffuse swelling of the left submandibular area  and left floor of mouth with a large stone in the distal portion of the  left submandibular duct.  It measures approximately 12 x 18 mm in size.  On examination, Angelica [name] has swollen left submandibular gland along with  swelling of the left side of floor of mouth.  She is having no airway  problems but does have difficulty swallowing and moving her tongue  secondary to pain.   IMPRESSION:  Acute left submandibular sialadenitis with obstructing  stone.   RECOMMENDATIONS:  We will try to plan on treating her with antibiotics.  She received a 300 mg of Cleocin IV in the ER.  We discharged her home  on Cleocin 300 mg t.i.d. for week in addition to Keflex 500 mg t.i.d.  for week, Tylenol, Darvocet p.r.n. pain.  We will notify us in 2 days if  not significantly better or follow up in my office tomorrow if she is  getting any worse.  On review of the CT scan, she has a large distal  submandibular duct stone.  We may need to remove this acutely if she  does not respond to antibiotic treatment.   DISPOSITION:  We will have the patient follow up with Dr. Haroldine Laws next  week if she response to antibiotic  therapy.           ______________________________  Kristine Garbe. Ezzard Standing, M.D.     CEN/MEDQ  D:  01/03/2009  T:  01/04/2009  Job:  [id number]   cc:   Hermelinda Medicus, M.D.

## 2010-10-03 NOTE — H&P (Signed)
NAMECOLLENE, [name] NO.:  [id number]   MEDICAL RECORD NO.:  [id number]          PATIENT TYPE:  OIB   LOCATION:  5118                         FACILITY:  [facility]   PHYSICIAN:  Hermelinda Medicus, M.D.   DATE OF BIRTH:  [birth year]   DATE OF ADMISSION:  08/25/2008  DATE OF DISCHARGE:                              HISTORY & PHYSICAL   The patient is a 70 year old female who has had a 30-year history of  congestion, sneezing, itching, watering eyes, cough, she has had  bronchitis in the past and has had considerable sinusitis in the past.  She has had also some asthma issues for which she has had some wheezing  and has been taking care of by Dr. Andria Meuse in the past and is now under  the care of Dr. Colonel Bald for her allergies.  She has had multiple  sinus infections in the past and has had surgery in the past also.  In  November 2006, she has had sinus surgery with removal of polyps.  She  has been continuing her immunotherapy and has also been on Astelin,  Asmanex, Accolate, albuterol and has been given steroids also in the  past to shrink the polyps.  She has had 2 episodes of bronchitis, but no  pneumonia.  She has increasing nasal symptoms with headaches and does  have slight blood pressure problems, which limited her use of  decongestants.  She again had a sinus CAT scan recently, which showed  pansinusitis where all sinuses involved, filled with polyps or fluid.  She now enters for functional endoscopic sinus surgery.   PAST HISTORY:  1. Gastroesophageal reflux.  2. 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  3. Hypertension.  4. She has also had knee and sinus surgery.  5. Hysterectomy and a partial thyroidectomy.   MEDICATIONS:  Listed in the chart.   PHYSICAL EXAMINATION:  VITAL SIGNS:  Blood pressure of 126/86 and her  pulse is 78.  HEENT:  Her ears are clear.  Tympanic membranes are clear and move well.  Her nose shows polyps on both sides with blockage of her nose at least  in  part.  Her oral cavity is clear of ulceration or mass.  True cords,  false cords,  epiglottis, base of tongue, and lateral pharyngeal wall  are all clear of ulceration or mass on laryngeal examination, and true  cord mobility, gag reflex, tongue mobility, EOMs, and facial nerve are  all symmetrical, as is shoulder strength.  CHEST:  Clear.  No rales, rhonchi, or wheezes.  She has had a treatment  this morning using a bronchodilator.  CARDIOVASCULAR:  Normal S1 and S2.  No murmurs or gallops.   EKG shows a sinus bradycardia, nonspecific ST and T-wave abnormalities.  At that time, her blood rate was 57.   INITIAL DIAGNOSES:  1. Sinusitis.  2. Allergic rhinitis.  3. History of diabetes.  4. History of hypertension.  5. Gastroesophageal reflux.  6. 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  7. Knee surgery.  8. Partial thyroidectomy.           ______________________________  Hermelinda Medicus, M.D.  JC/MEDQ  D:  08/25/2008  T:  08/26/2008  Job:  [id number]   cc:   Meliton Rattan. Willa Rough, M.D.  Margaretmary Bayley, M.D.

## 2010-10-03 NOTE — Op Note (Signed)
Angelica [name], Angelica [name] NO.:  [id number]   MEDICAL RECORD NO.:  [id number]          PATIENT TYPE:  OIB   LOCATION:  5118                         FACILITY:  [facility]   PHYSICIAN:  Hermelinda Medicus, M.D.   DATE OF BIRTH:  [birth year]   DATE OF PROCEDURE:  DATE OF DISCHARGE:                               OPERATIVE REPORT   PREOPERATIVE DIAGNOSES:  Nasal polyposis, pansinusitis with nasal and  sinus polyps, history of allergic rhinitis, history of bronchitis,  history of sinus surgery in November 2006.   POSTOPERATIVE DIAGNOSES:  Nasal polyposis, pansinusitis with nasal and  sinus polyps, history of allergic rhinitis, history of bronchitis,  history of sinus surgery in November 2006.   OPERATIONS:  Functional endoscopic sinus surgery, bilateral frontal  ethmoidectomies, bilateral maxillary sinus ostium enlargement, bilateral  sphenoidotomies, and removal of nasal polyps with reduction of  turbinates.   OPERATOR:  Hermelinda Medicus, MD   ANESTHESIA:  Local MAC with Dr. Sondra Come.   The patient is aware of the risks and gains, the fact that we are  working near her eyes, and her frontal lobe region, and she is also  aware that she cannot do any heavy lifting.  She is going to be staying  overnight for observation, and does have a history of asthma and  bronchitis, we will be watching that carefully.   PROCEDURE:  The patient was prepped and draped in usual manner,  anesthetized with 1% Xylocaine with epinephrine 8 mL and topical cocaine  200 mg.  The left side was first approached, the ethmoid sinus using the  0 degree scope was entered and considerable polyps were removed not only  from the nose but the ethmoid sinus, and then up toward the frontal  sinus, the natural ostium was cleared off polyps, so that we can suction  the mucous from the frontal sinus in the left side using a curved  suction.  Once we could see up in that region and clear the sinus, we  felt that it was  more fluid we saw in the frontal sinus than polyps,  which was greatly advantageous.  The ethmoid sinus was cleared letting  the frontal sinus drain effectively.  The sphenoid sinus was then  approached pushing the middle turbinate lateral and polyps lead [place] right  to the natural ostium of the sphenoid sinus in the left, we removed  those polyps, and opened the natural ostium of the sphenoid sinus.  Once  this was completed, then we approached the maxillary sinus, where a  curved suction was used to find the sinus as it was right down to a  pinhole, and could not be seen with the scope and once we found this,  then we increased its size approximately 5 times of normal size, so that  it could drain effectively even in the face of allergy.  Mucus was  suctioned from this.  On the right side, there was also considerable  nasal polyps, and also some lysis of adhesions had to be completed to  free up the middle turbinates, so that the natural ostium could be  seen  and the ethmoid sinus.  A 0 degree scope was again used.  The ethmoid  sinus was cleared of these multiple polyps, which essentially filled it,  and then the frontal sinus, the natural ostium was found and using the  upbiting forceps, a 0 degree and 70 scope, we were able to open the  frontal sinus effectively, and get it to drain effectively well.  Once  these sinuses were approached, we then again pushed the middle  turbinate, which was quite damaged and destroyed by the polypoid  changes, lateral and the sphenoid sinus was opened as the polyps were  removed from these natural ostium.  The natural ostium was found and the  sphenoid sinus was opened on the right, and also on the right side the  maxillary sinus was found to be filled with fluid.  We again found the  natural ostium.  We again opened this, found it with the curved suction,  opened this with the backbiting and sidebiting forceps using the scope,  and then looking in  there, we just found thick mucus and thick mucous  membrane, which we felt would revert back towards more normal status  once the sinus could drain effectively.  After completion of this, we  then placed Gelfoam in the ethmoid sinus followed by Gelfilm to hold the  middle turbinates medial, and then Merocel 10-cm packs were placed  without difficulty.  The patient will stay overnight.  Blood loss was  estimated at 100 mL.  The patient tolerated the procedure very well.  We  will be keeping her overnight on 23-hour observation.           ______________________________  Hermelinda Medicus, M.D.     JC/MEDQ  D:  08/25/2008  T:  08/26/2008  Job:  [id number]   cc:   Margaretmary Bayley, M.D.  Roselyn M. Willa Rough, M.D.

## 2010-10-06 NOTE — Op Note (Signed)
Angelica [name], ASHBAUGH NO.:  [id number]   MEDICAL RECORD NO.:  [id number]          PATIENT TYPE:  OIB   LOCATION:  2852                         FACILITY:  [facility]   PHYSICIAN:  Hermelinda Medicus, M.D.   DATE OF BIRTH:  05-Sep-1940   DATE OF PROCEDURE:  03/28/2005  DATE OF DISCHARGE:                                 OPERATIVE REPORT   PREOPERATIVE DIAGNOSIS:  Bilateral pansinusitis, maxillary, ethmoid,  sphenoid, and frontal sinusitis with nasal polyposis with turbinate  hypertrophy.   POSTOPERATIVE DIAGNOSIS:  Bilateral pansinusitis, maxillary, ethmoid,  sphenoid, and frontal sinusitis with nasal polyposis with turbinate  hypertrophy.   OPERATION:  Functional endoscopic sinus surgery, bilateral frontal  ethmoidectomies, bilateral sphenoidotomy, bilateral maxillary sinus ostial  enlargement with removal of polyps and removal of nasal polyps with  reduction of the turbinates.   SURGEON:  Hermelinda Medicus, M.D.   ANESTHESIA:  MAC, 1% Xylocaine with epinephrine 12 mL and topical cocaine  200 mg.   PROCEDURE:  The patient was placed in the supine position and under MAC  anesthesia, the patient was prepped and draped in the appropriate manner  using Hibiclens and the usual head drape.  The left side was first  approached and polyps were leading [place] right to the sphenoid and using the 0  and upbiting forceps, we were able to remove the polyps from the nose  finding the natural ostium of the sphenoid and using a straight shelf, we  opened the sphenoid sinus natural ostium.  We did not try to remove all the  polyps from the sphenoid, we were just trying to increase the natural ostial  size so that we could have an excellent drainage.  The frontal ethmoid was  then approached and by pushing the middle turbinate medial, we entered the  ethmoid with the upbiting and straight Blakeselys and, again, removed  considerable polyps as the sinus was totally obliterated by polypoid  debris.  Once this was completed, we could see up into the sinus, into the frontal  sinus, as well as the ethmoid using the 0 degree scope and we then worked on  the left maxillary sinus, finding the natural ostium and then increasing its  size approximately five times and suctioning debris from this sinus.  Again,  on the right side, we approached the right sphenoid and similarly, using the  0 degree scope, removed polyps and opened the natural ostium.  By pushing  that middle turbinate medial, we then approached the ethmoid sinus which,  again, was filled with nasal and ethmoid polyps which were removed and using  the upbiting and straight Blakeselys and the 0 degree scope, once again, the  natural ostium was found on the maxillary sinus and this was increased in  size and material was suctioned from the sinus as well as polyps removed.  Once this was completed, the inferior turbinate had also been out-fractured  and then the L-Med bipolar cautery was used to shrink the membranes of these  turbinates.  We reviewed the entire area to make sure we did not leave any  polyps behind and  then Gelfoam was placed in the ethmoid sinus, Gelfilm was  placed to keep the middle turbinates medially, and then 10 cm Merocel packs  were placed on both sides.  The patient tolerated the procedure  well and is doing well postoperatively.  She will be observed on 3300  because of her asthma and the nasal packing which makes the patient  potentially a sleep apnea patient, so this will be observed carefully.  Follow up will be in one week, three weeks, six weeks, three months, six  months, and a year.           ______________________________  Hermelinda Medicus, M.D.     JC/MEDQ  D:  03/28/2005  T:  03/28/2005  Job:  [id number]   cc:   Margaretmary Bayley, M.D.  Fax: [phone]

## 2010-10-06 NOTE — H&P (Signed)
[facility]  Patient:    Angelica [name], Angelica [name] Visit Number: [id number] MRN: [id number]          Service Type: Attending:  Silverio Lay, M.D. Dictated by:   Silverio Lay, M.D. Adm. Date:  08/06/01                           History and Physical  DATE OF BIRTH:                1940-07-31  REASON FOR ADMISSION:         Uterine prolapse.  HISTORY OF PRESENT ILLNESS:   This is a 70 year old married black female, gravida 6, para 4, aborta 2, who presented in the office on July 07, 2001, complaining of bulging inside her vagina that was worsening over the last week.  She also reported increased frequency of urination without frank incontinence and without dysuria.  She denied any pain but reported being very uncomfortable.  She has been menopause for many years and has had no postmenopausal bleeding.  She is currently using femhrt since October 2000 and has been on HRT (hormone replacement therapy) since 1998.  When she came into the office, she was seen in the emergency room three days prior and was informed that she had a cystocele and was coming in to be evaluated for that.  REVIEW OF SYSTEMS:            CONSTITUTIONAL:  Negative.  HEART:  Negative. LUNGS:  Negative.  SKIN:  Negative.  GASTROINTESTINAL:  Increased constipation.  No nausea, no vomiting.  Weight is stable.  GENITOURINARY: Increased frequency of urination, no hematuria, no dysuria, no incontinence. PSYCHIATRIC:  Negative.  PAST MEDICAL HISTORY:         1. Known ALLERGY to PENICILLIN, ASPIRIN.                               2. Status post bilateral tubal ligation.                               3. Status post thyroid surgery, benign                                  condition.                               4. Status post four spontaneous vaginal                                  delivery.  CURRENT MEDICATIONS:          1. Zyrtec.                               2. Nexium.                  3. Femhrt.                               4. Colace.  SOCIAL HISTORY:  Married, nonsmoker.  Works at the [employer].  FAMILY HISTORY:               Negative for cancer.  Brother with coronary artery disease.  Mother and sister with diabetes.  No family history of hypertension.  PHYSICAL EXAMINATION:  VITAL SIGNS:                  Normal with blood pressure 130/90, weight 238 pounds, height 5 foot 6.5 inches.  HEENT:                        Negative.  LUNGS:                        Clear.  HEART:                        Normal.  BREASTS:                      Negative.  BACK:                         No CVA tenderness.  ABDOMEN:                      No tenderness, masses, or hepatosplenomegaly.  EXTREMITIES:                  Negative.  NEUROLOGIC:                   Normal.  GYNECOLOGICAL:                Normal vulva, vagina.  Cervix is normal appearance, prolapsing 2 and 3.  Uterus is nontender, anteverted, and mobile. Adnexa not felt.  No masses felt.  The patient has had a previous midline incision for her tubal ligation.  Pap smear in December 2002 within normal limits.  Mammogram December 2002 normal.  Bone density December 2000 normal.  ASSESSMENT:                   Uterine prolapse in an otherwise postmenopausal healthy female, currently on hormone replacement therapy.  No evidence of cystocele or rectocele.  PLAN:                         The patient was offered expectant management, trial of pessary, or surgical correction with vaginal hysterectomy.  She preferred a definitive approach and also requested, after discussion, prophylactic bilateral salpingo-oophorectomy for reduction of cancer risks. Decision was then made to proceed with laparoscopic-assisted vaginal hysterectomy and bilateral salpingo-oophorectomy and because of her midline incision, we will proceed with an open laparoscopy.  The procedure has been fully reviewed with  the patient including possible complications such as bleeding; infection; injury to other organs such as bowel, bladder, ureters; need for laparotomy; hospital stay, and recovery.  The patient has also been informed that prophylactic oophorectomy does not fully guarantee eliminating the risk of ovarian cancer or peritoneal cancer.  Informed consent was obtained. Dictated by:   Silverio Lay, M.D. Attending:  Silverio Lay, M.D. DD:  08/04/01 TD:  08/04/01 Job: [id number]

## 2010-10-06 NOTE — Op Note (Signed)
[facility]  Patient:    Angelica [name], Angelica [name] Visit Number: [id number] MRN: [id number]          Service Type: DSU Location: [id number] Attending Physician:  Esmeralda Arthur Dictated by:   Silverio Lay, M.D. Proc. Date: 08/06/01 Admit Date:  08/06/2001 Discharge Date: 08/07/2001                             Operative Report  PREOPERATIVE DIAGNOSES:       Uterine prolapse with desire for definitive treatment and desire for prophylactic oophorectomy.  POSTOPERATIVE DIAGNOSES:      Uterine prolapse with desire for definitive treatment and desire for prophylactic oophorectomy, pelvic adhesions.  ANESTHESIA:                   General with endotracheal intubation.  PROCEDURE:                    Laparoscopically assisted vaginal hysterectomy with bilateral salpingo-oophorectomy and lysis of adhesions.  SURGEON:                      Silverio Lay, M.D.  ASSISTANT:                    Henreitta Leber, P.A.  ESTIMATED BLOOD LOSS:         150 cc.  PROCEDURE:                    After being informed of the planned procedure including possible complications such as infection, bleeding, injury to bowels, bladder, or ureter, need for laparotomy, subsequent cystocele or rectocele, informed consent was obtained.  Patient was taken to OR #4, given general anesthesia with endotracheal intubation, and placed in the lithotomy position.  She was prepped and draped in a sterile fashion and a Foley catheter was inserted in her bladder.  A vaginal speculum was inserted. Cervix was grasped with a tenaculum and an acorn manipulator was placed in her cervix.  We proceeded with infiltrating the umbilical area using 10 cc of Marcaine 0.25 and performed a semi elliptical incision of 10 mm with knife. Incision was bluntly dissected down to the fascia which was grasped with two Kocher forceps.  Fascia was then incised, first with knives, then with Mayos and the peritoneum was  bluntly entered.  A Hasson trocar was inserted after performing a bursa suture of 0 Vicryl on the fascia.  We proceeded with inflating the abdomen using CO2 at a maximum pressure of 15 mmHg and entered the laparoscope.  Observation:  We see on the left aspect of the midline incision adhesions with the omentum and the bowels on a distance of about 6 cm.  Those adhesions are dense, thick, but do not obstruct our view of the pelvis.  One small adhesion on the right aspect of the incision which is fine, thin, and avascular is sectioned allowing [place] evaluation of the pelvis. Anterior cul-de-sac is normal.  Uterus is slightly bulky, but otherwise normal in appearance.  On the left uterine cornua an adhesion with small bowel is observed.  Left tube is status post partial salpingectomy.  Left ovary is normal and we are able to visualize the left ureter easily.  Posterior cul-de-sac is negative.  Right adnexa is slightly adherent to bowel, but otherwise is fairly mobile.  The right ureter is also easily visualized.  We  decide to put a suprapubic as well as a right lower quadrant 5 mm trocar under direct visualization and to attempt lysis of adhesions with the left uterine cornua.  A gentle traction is applied on the bowel using an atraumatic grasper and we proceed with systematically lysing the adhesion in planes that we can easily see using Metzenbaum scissors.  Irrigation and suction helps [place] dissect and we finally end the dissection with bipolar cauterization in section.  We then put our attention on the bowels which have remained intact.  Serosa is still pink.  No bleeding.  This allows [place] to fully mobilize the uterus and we proceed with lysing the adhesion on the right adnexa.  Again, using gentle traction we are able to see an avascular space which is sectioned with Metzenbaum scissors.  We then proceed with the laparoscopy assisted vaginal hysterectomy by cauterizing each infundibulopelvic  ligament using the tripolar with the ureter under direct visualization on both sides.  This is performed with no blood loss.  Both round ligaments are then also cauterized in section using the tripolar.  This allows [place] to incise the anterior broad ligament and to bluntly retract the bladder down.  We irrigate and observe an adequate hemostasis.  We then proceed with the vaginal time.  Patient is repositioned. A weighted speculum is inserted in the vagina and the cervix is grasped with two tenaculum forceps.  The vaginal mucosa around the cervix is infiltrated with lidocaine 1%, epinephrine 1:100,000 using 10 cc.  The vaginal mucosa is then opened in a circumferential manner using a knife and then anterior and posterior vaginal mucosa are bluntly dissected.  The anterior peritoneum is entered bluntly.  The posterior peritoneum and posterior cul-de-sac are entered sharply.  This allows [place] to proceed with clamping of the uterosacral ligaments on both sides, sectioning and suturing with a transfixed suture of 0 Vicryl kept for future suspension.  Uterine arteries are then clamped on both sides using Rogers clamps, sectioned and sutured with 0 Vicryl.  The remainder of the broad ligament is then clamped on both sides, sectioned, sutured with a transfixed suture of 0 Vicryl and the uterus is removed with its two adnexa. Hemostasis is completed on the left margin close to the uterine pedicle using a figure-of-eight stitch of 0 Vicryl.  Hemostasis is then reassessed and felt to be adequate.  We proceed with Moschcowitz closure of the posterior cul-de-sac with a 0 Vicryl suture joining both uterosacral ligaments and the posterior peritoneum.  The posterior lip of the vaginal cuff is then sutured using a running locked suture of 0 Vicryl attaching both uterosacral ligaments together for suspension.  Hemostasis is still adequate.  The vaginal cuff is then closed medially with figure-of-eight stitches  of 0 Vicryl.  We go back to the laparoscopy time.  We reinflate the abdomen using CO2 at the maximum pressure of 15 mmHg and use abundant warm irrigation and suction to evaluate  hemostasis.  This is completed on the left IP ligament using cautery and on the bladder dissection using cautery.  We then reduce the intra-abdominal pressure and still observe a normal hemostasis.  Both 5 mm trocar are removed under direct visualization.  Instruments are removed.  Pneumoperitoneum is removed.  The fascia of the 10 mm incision is closed with the previously placed bursa suture making sure that no bowel is strangulated in the suture. All incisions are then closed with subcuticular suture of 4-0 Vicryl and Steri-Strips.  Instruments and sponge  count is complete x2.  Estimated blood loss 150 cc.  Urine output during procedure 400 cc.  The procedure is well tolerated by the patient who is taken to the recovery room in a well and stable condition. Dictated by:   Silverio Lay, M.D. Attending Physician:  Esmeralda Arthur DD:  08/06/01 TD:  08/07/01 Job: [id number]

## 2010-10-06 NOTE — H&P (Signed)
Angelica [name], Angelica [name]              ACCOUNT NO.:  [id number]   MEDICAL RECORD NO.:  [id number]          PATIENT TYPE:  AMB   LOCATION:  SDS                          FACILITY:  [facility]   PHYSICIAN:  Hermelinda Medicus, M.D.   DATE OF BIRTH:  1941/05/02   DATE OF ADMISSION:  03/28/2005  DATE OF DISCHARGE:                                HISTORY & PHYSICAL   This patient is a 70 year old female who enters with a long history of  sinusitis problems.  She has had multiple antibiotic use.  I first saw her  after she had persistent sinusitis problem where she had been on Biaxin  recently, and had very little response to this.  She has also used  decongestants, Allegra-D, and nasal sprays with very little response.  I  treated her more aggressively with Avelox 400,  Maxifed-G, along with the  allergy medications and then we obtained a CAT scan which showed  pansinusitis where all the sinuses were involved showing a total  opacification of frontal and ethmoid, partial of the sphenoid, and partial  of the maxillary sinuses with also evidence of nasal polyposis on scan as  well as in our physical examination.  She also has a history of considerable  amount of asthma which has been treated with medications and we now enter  for functional endoscopic sinus surgery to see if we can get these sinuses  to drain.   PAST MEDICAL HISTORY:  One of using Benicar Hydrocortisone 40/12.5 p.o.  daily, Singulair 10 mg p.o. daily, Avelox 400 mg p.o. daily, Ambien 60/580  p.o. 1/2 b.i.d., Prevacid 30 daily, and albuterol inhaler p.r.n.   ALLERGIES:  PENICILLIN and ASPIRIN.   PAST HISTORY:  Partial thyroidectomy approximately 15 years ago, bilateral  knee endoscopy, hysterectomy at 70 years old, 3 years ago, and a tubal  ligation in the distant past.  Never seen a cardiologist and has a normal  EKG, but does have a history of hypertension.  Blood pressure today is  123/76.  She has had a history of bronchitis  associated with the sinusitis  and has a considerable history of asthma problems which are not terribly  improved using these medications.  She does have a history of reflux  esophagitis, has a history of constipation.  Right hand finger trauma,  arthritis apparently.  Never had surgery.  She has had a hysterectomy.   PHYSICAL EXAMINATION:  GENERAL:  A well-nourished female.  VITAL SIGNS:  Blood pressure 123/76.  She is 5 feet 5 inches and weighs 224,  heart rate 71, respirations 18, and temperature 97.8.  HEENT:  Ears are clear.  Tympanic membranes are clear.  Her oral cavity is  clear.  Her nose shows polyps on both sides of her nose especially  posteriorly associated with her sphenoid and blocking her nasal vestibule  with considerable obstruction by the inferior turbinates as well.  Nasopharynx, however, appears to be completely clear of any ulceration or  mass.  The larynx is clear, true cords, false cords, epiglottis, base of  tongue are free of ulceration or mass.  Trachea mobility,  gag reflex, tongue  mobility, EOMs, facial nerve are all symmetrical.  HEART:  No murmurs or gallops.  LUNGS:  Some bronchial sounds with cough and some expiratory wheezes.  ABDOMEN:  Mildly obese.  EXTREMITIES:  Unremarkable except the knee.  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   ADMISSION DIAGNOSES:  1.  Pansinusitis with history of allergic rhinitis with history of asthma      and bronchitis.  2.  History of penicillin and aspirin allergy.  3.  History of hysterectomy.  4.  Knee surgery.  5.  Tubal ligation.  6.  Partial thyroid surgery.           ______________________________  Hermelinda Medicus, M.D.     JC/MEDQ  D:  03/28/2005  T:  03/28/2005  Job:  [id number]   cc:   Margaretmary Bayley, M.D.  Fax: [phone]

## 2011-05-21 ENCOUNTER — Other Ambulatory Visit: Payer: Self-pay | Admitting: Internal Medicine

## 2011-05-21 DIAGNOSIS — Z1231 Encounter for screening mammogram for malignant neoplasm of breast: Secondary | ICD-10-CM

## 2011-06-18 ENCOUNTER — Ambulatory Visit (HOSPITAL_COMMUNITY)
Admission: RE | Admit: 2011-06-18 | Discharge: 2011-06-18 | Disposition: A | Discharge status: Home / Self Care | Payer: Medicare HMO | Source: Ambulatory Visit | Attending: Internal Medicine | Admitting: Internal Medicine

## 2011-06-18 DIAGNOSIS — Z1231 Encounter for screening mammogram for malignant neoplasm of breast: Secondary | ICD-10-CM | POA: Insufficient documentation

## 2011-08-24 ENCOUNTER — Encounter (HOSPITAL_COMMUNITY): Payer: Self-pay | Admitting: *Deleted

## 2011-08-24 ENCOUNTER — Emergency Department (INDEPENDENT_AMBULATORY_CARE_PROVIDER_SITE_OTHER)
Admission: EM | Admit: 2011-08-24 | Discharge: 2011-08-24 | Disposition: A | Discharge status: Home / Self Care | Payer: Medicare HMO | Source: Home / Self Care | Attending: Family Medicine | Admitting: Family Medicine

## 2011-08-24 DIAGNOSIS — J329 Chronic sinusitis, unspecified: Secondary | ICD-10-CM

## 2011-08-24 DIAGNOSIS — A084 Viral intestinal infection, unspecified: Secondary | ICD-10-CM

## 2011-08-24 DIAGNOSIS — A09 Infectious gastroenteritis and colitis, unspecified: Secondary | ICD-10-CM

## 2011-08-24 HISTORY — DX: Gastro-esophageal reflux disease without esophagitis: K21.9

## 2011-08-24 HISTORY — DX: Essential (primary) hypertension: I10

## 2011-08-24 HISTORY — DX: Other allergy status, other than to drugs and biological substances: Z91.09

## 2011-08-24 MED ORDER — ONDANSETRON 4 MG PO TBDP: 4.0000 mg | ORAL_TABLET | Freq: Three times a day (TID) | ORAL | Status: AC | PRN

## 2011-08-24 MED ORDER — AZITHROMYCIN 250 MG PO TABS: 250.0000 mg | ORAL_TABLET | Freq: Every day | ORAL | Status: AC

## 2011-08-24 MED ORDER — PREDNISONE 20 MG PO TABS: ORAL_TABLET | ORAL | Status: AC

## 2011-08-24 NOTE — ED Notes (Signed)
C/O diarrhea yesterday - "has thickened up some today"; has had 2 episodes today.  Denies nausea or pain, but c/o "a wooziness" in stomach.  Denies vomiting or fevers.  Also c/o post-nasal drainage with a clear, productive cough.  Has been using albuterol with some relief.

## 2011-08-24 NOTE — Discharge Instructions (Signed)
keep well hydrated. Take the prescribed medications as instructed. Can also take over-the-counter Zyrtec 10 mg at bedtime every day for allergies. If persistent diarrhea can take over-the-counter or  Imodium (loperamide) aspirin label instructions. Continue to use your steroid nasal inhaler as well as your albuterol inhaler as previously prescribed. Use nasal saline spray at least 3 times a day. (simply saline is over the counter) alternate with a nasal steroid. Followup with your primary care provider next week or return if worsening symptoms like fever, chest pain or difficulty breathing despite following treatment.

## 2011-08-27 NOTE — ED Provider Notes (Signed)
History     CSN: [id number]  Arrival date & time 08/24/11  1836   First MD Initiated Contact with Patient 08/24/11 1839      Chief Complaint  Patient presents with  . Diarrhea  . Nasal Congestion    (Consider location/radiation/quality/duration/timing/severity/associated sxs/prior treatment) HPI Comments: 71 y/o female with h/o chronic lung disease and allergic rhinitis among other comorbidities here c/o diarrhea for 2 days about 4 runny stools yesterday, today 2 bowel movements with less amount and increased consistency. No blood or mucus. Has felt nausea but denies vomiting. No fever, chills or rigors. decreased appetite. Symptoms associated with cough productive of clear sputum and persistent post nasal drip and sinus pressure. Using albuterol, nasal and inhaled steroid with minimal relief. Denies chest pain or shortness of breath.    Past Medical History  Diagnosis Date  . Hypertension   . Asthma   . Environmental allergies   . GERD (gastroesophageal reflux disease)     Past Surgical History  Procedure Date  . Knee surgery   . Nasal sinus surgery   . Thyroid surgery     No family history on file.  History  Substance Use Topics  . Smoking status: Former Games developer  . Smokeless tobacco: Not on file  . Alcohol Use: No    OB History    Grav Para Term Preterm Abortions TAB SAB Ect Mult Living                  Review of Systems  Constitutional: Positive for appetite change. Negative for fever and chills.  HENT: Positive for congestion, rhinorrhea, postnasal drip and sinus pressure. Negative for ear pain, sore throat and trouble swallowing.   Respiratory: Positive for cough. Negative for shortness of breath and wheezing.   Cardiovascular: Negative for chest pain and leg swelling.  Gastrointestinal: Positive for nausea and diarrhea. Negative for vomiting, abdominal pain and constipation.  Genitourinary: Negative for dysuria, frequency, hematuria, flank pain and  difficulty urinating.  Neurological: Negative for dizziness and headaches.    Allergies  Asa buff (mag; Peanut-containing drug products; Penicillins; Shellfish-derived products; and Strawberry  Home Medications   Current Outpatient Rx  Name Route Sig Dispense Refill  . PROAIR HFA IN Inhalation Inhale into the lungs.    . AZELASTINE HCL 0.15 % NA SOLN Nasal Place into the nose.    Illa Level NA Nasal Place into the nose.    [place] IN Inhalation Inhale into the lungs.    . DEXLANSOPRAZOLE 60 MG PO CPDR Oral Take 60 mg by mouth as needed.    Marland Kitchen LOSARTAN POTASSIUM-HCTZ 100-12.5 MG PO TABS Oral Take 1 tablet by mouth daily.    . AZITHROMYCIN 250 MG PO TABS Oral Take 1 tablet (250 mg total) by mouth daily. Take first 2 tablets together, then 1 every day until finished. 6 tablet 0  . ONDANSETRON 4 MG PO TBDP Oral Take 1 tablet (4 mg total) by mouth every 8 (eight) hours as needed for nausea. 10 tablet 0  . PREDNISONE 20 MG PO TABS  2 tabs po daily for 5 days 10 tablet no    BP 117/77  Pulse 71  Temp(Src) 98 F (36.7 C) (Oral)  Resp 20  SpO2 100%  Physical Exam  Nursing note and vitals reviewed. Constitutional: She is oriented to person, place, and time. She appears well-developed and well-nourished. No distress.       Pleasant. Appears comfortable.  HENT:  Head: Normocephalic and atraumatic.  Right Ear: External ear normal.  Left Ear: External ear normal.  Mouth/Throat: Oropharynx is clear and moist.       Nasal congestion, with white thin nasal discharge. swelling or nasal turbinates. Postnasal drip. Reported bilateral frontomaxillary sinus tenderness to palpation. mild pharyngeal erythema, no exudates. Uvula central.  TMs norml.  Eyes: Conjunctivae are normal. Pupils are equal, round, and reactive to light. Right eye exhibits no discharge. Left eye exhibits no discharge.  Neck: Neck supple. No thyromegaly present.  Cardiovascular: Normal rate, regular rhythm and normal heart  sounds.   Pulmonary/Chest: Effort normal and breath sounds normal. No respiratory distress. She has no wheezes. She has no rales. She exhibits no tenderness.       Bronchitic cough.  Abdominal: Soft. Bowel sounds are normal. She exhibits no distension and no mass. There is no tenderness. There is no rebound and no guarding.  Lymphadenopathy:    She has no cervical adenopathy.  Neurological: She is alert and oriented to person, place, and time.  Skin: No rash noted. She is not diaphoretic.    ED Course  Procedures (including critical care time)  Labs Reviewed - No data to display No results found.   1. Viral gastroenteritis   2. Sinusitis       MDM  Reassuring lung exam. Treated with prednisone, azithromycin and Zofran. Continue with previously prescribed inhalers and Claritin over the counter daily. Supportice care recommendations provided asked to return or follow up with her PCP if worsening or new symptoms despite following treatment.        Sharin Grave, MD 08/27/11 [phone]

## 2012-01-02 ENCOUNTER — Other Ambulatory Visit: Payer: Self-pay | Admitting: Gastroenterology

## 2012-05-13 ENCOUNTER — Other Ambulatory Visit: Payer: Self-pay | Admitting: Internal Medicine

## 2012-05-13 DIAGNOSIS — Z1231 Encounter for screening mammogram for malignant neoplasm of breast: Secondary | ICD-10-CM

## 2012-06-09 ENCOUNTER — Encounter (HOSPITAL_COMMUNITY): Payer: Self-pay | Admitting: Family Medicine

## 2012-06-09 ENCOUNTER — Emergency Department (HOSPITAL_COMMUNITY): Payer: Medicare HMO

## 2012-06-09 ENCOUNTER — Emergency Department (HOSPITAL_COMMUNITY)
Admission: EM | Admit: 2012-06-09 | Discharge: 2012-06-09 | Disposition: A | Discharge status: Home / Self Care | Payer: Medicare HMO | Attending: Emergency Medicine | Admitting: Emergency Medicine

## 2012-06-09 DIAGNOSIS — E876 Hypokalemia: Secondary | ICD-10-CM | POA: Insufficient documentation

## 2012-06-09 DIAGNOSIS — K219 Gastro-esophageal reflux disease without esophagitis: Secondary | ICD-10-CM | POA: Insufficient documentation

## 2012-06-09 DIAGNOSIS — R197 Diarrhea, unspecified: Secondary | ICD-10-CM | POA: Insufficient documentation

## 2012-06-09 DIAGNOSIS — R1011 Right upper quadrant pain: Secondary | ICD-10-CM | POA: Insufficient documentation

## 2012-06-09 DIAGNOSIS — R109 Unspecified abdominal pain: Secondary | ICD-10-CM

## 2012-06-09 DIAGNOSIS — I10 Essential (primary) hypertension: Secondary | ICD-10-CM | POA: Insufficient documentation

## 2012-06-09 DIAGNOSIS — Z79899 Other long term (current) drug therapy: Secondary | ICD-10-CM | POA: Insufficient documentation

## 2012-06-09 DIAGNOSIS — J45909 Unspecified asthma, uncomplicated: Secondary | ICD-10-CM | POA: Insufficient documentation

## 2012-06-09 DIAGNOSIS — Z87891 Personal history of nicotine dependence: Secondary | ICD-10-CM | POA: Insufficient documentation

## 2012-06-09 LAB — CBC WITH DIFFERENTIAL/PLATELET
Basophils Absolute: 0.1 10*3/uL (ref 0.0–0.1)
Basophils Relative: 1 % (ref 0–1)
Eosinophils Absolute: 0.5 10*3/uL (ref 0.0–0.7)
Eosinophils Relative: 5 % (ref 0–5)
HCT: 39.6 % (ref 36.0–46.0)
Hemoglobin: 13.7 g/dL (ref 12.0–15.0)
Lymphocytes Relative: 39 % (ref 12–46)
Lymphs Abs: 3.8 10*3/uL (ref 0.7–4.0)
MCH: 30.8 pg (ref 26.0–34.0)
MCHC: 34.6 g/dL (ref 30.0–36.0)
MCV: 89 fL (ref 78.0–100.0)
Monocytes Absolute: 0.7 10*3/uL (ref 0.1–1.0)
Monocytes Relative: 7 % (ref 3–12)
Neutro Abs: 4.7 10*3/uL (ref 1.7–7.7)
Neutrophils Relative %: 49 % (ref 43–77)
Platelets: 267 10*3/uL (ref 150–400)
RBC: 4.45 MIL/uL (ref 3.87–5.11)
RDW: 13.2 % (ref 11.5–15.5)
WBC: 9.6 10*3/uL (ref 4.0–10.5)

## 2012-06-09 LAB — COMPREHENSIVE METABOLIC PANEL
ALT: 9 U/L (ref 0–35)
AST: 20 U/L (ref 0–37)
Albumin: 3.8 g/dL (ref 3.5–5.2)
Alkaline Phosphatase: 88 U/L (ref 39–117)
BUN: 13 mg/dL (ref 6–23)
CO2: 26 mEq/L (ref 19–32)
Calcium: 9.6 mg/dL (ref 8.4–10.5)
Chloride: 101 mEq/L (ref 96–112)
Creatinine, Ser: 0.73 mg/dL (ref 0.50–1.10)
GFR calc Af Amer: >90 mL/min (ref >90)
GFR calc non Af Amer: 83 mL/min — ABNORMAL LOW (ref >90)
Glucose, Bld: 100 mg/dL — ABNORMAL HIGH (ref 70–99)
Potassium: 3 mEq/L — ABNORMAL LOW (ref 3.5–5.1)
Sodium: 139 mEq/L (ref 135–145)
Total Bilirubin: 0.3 mg/dL (ref 0.3–1.2)
Total Protein: 8 g/dL (ref 6.0–8.3)

## 2012-06-09 LAB — URINALYSIS, ROUTINE W REFLEX MICROSCOPIC
Bilirubin Urine: NEGATIVE
Glucose, UA: NEGATIVE mg/dL
Hgb urine dipstick: NEGATIVE
Ketones, ur: NEGATIVE mg/dL
Nitrite: NEGATIVE
Protein, ur: NEGATIVE mg/dL
Specific Gravity, Urine: 1.006 (ref 1.005–1.030)
Urobilinogen, UA: 0.2 mg/dL (ref 0.0–1.0)
pH: 6.5 (ref 5.0–8.0)

## 2012-06-09 LAB — LIPASE, BLOOD: Lipase: 28 U/L (ref 11–59)

## 2012-06-09 LAB — URINE MICROSCOPIC-ADD ON

## 2012-06-09 IMAGING — CR DG ABDOMEN ACUTE W/ 1V CHEST
3 series · 3 of 3 positions shown · non-contrast
Comparison: [DATE]

CLINICAL DATA: Left side abdominal pain, chronic constipation,
history asthma, hypertension, gastritis

ACUTE ABDOMEN SERIES (ABDOMEN 2 VIEW & CHEST 1 VIEW)

[w chest pa]
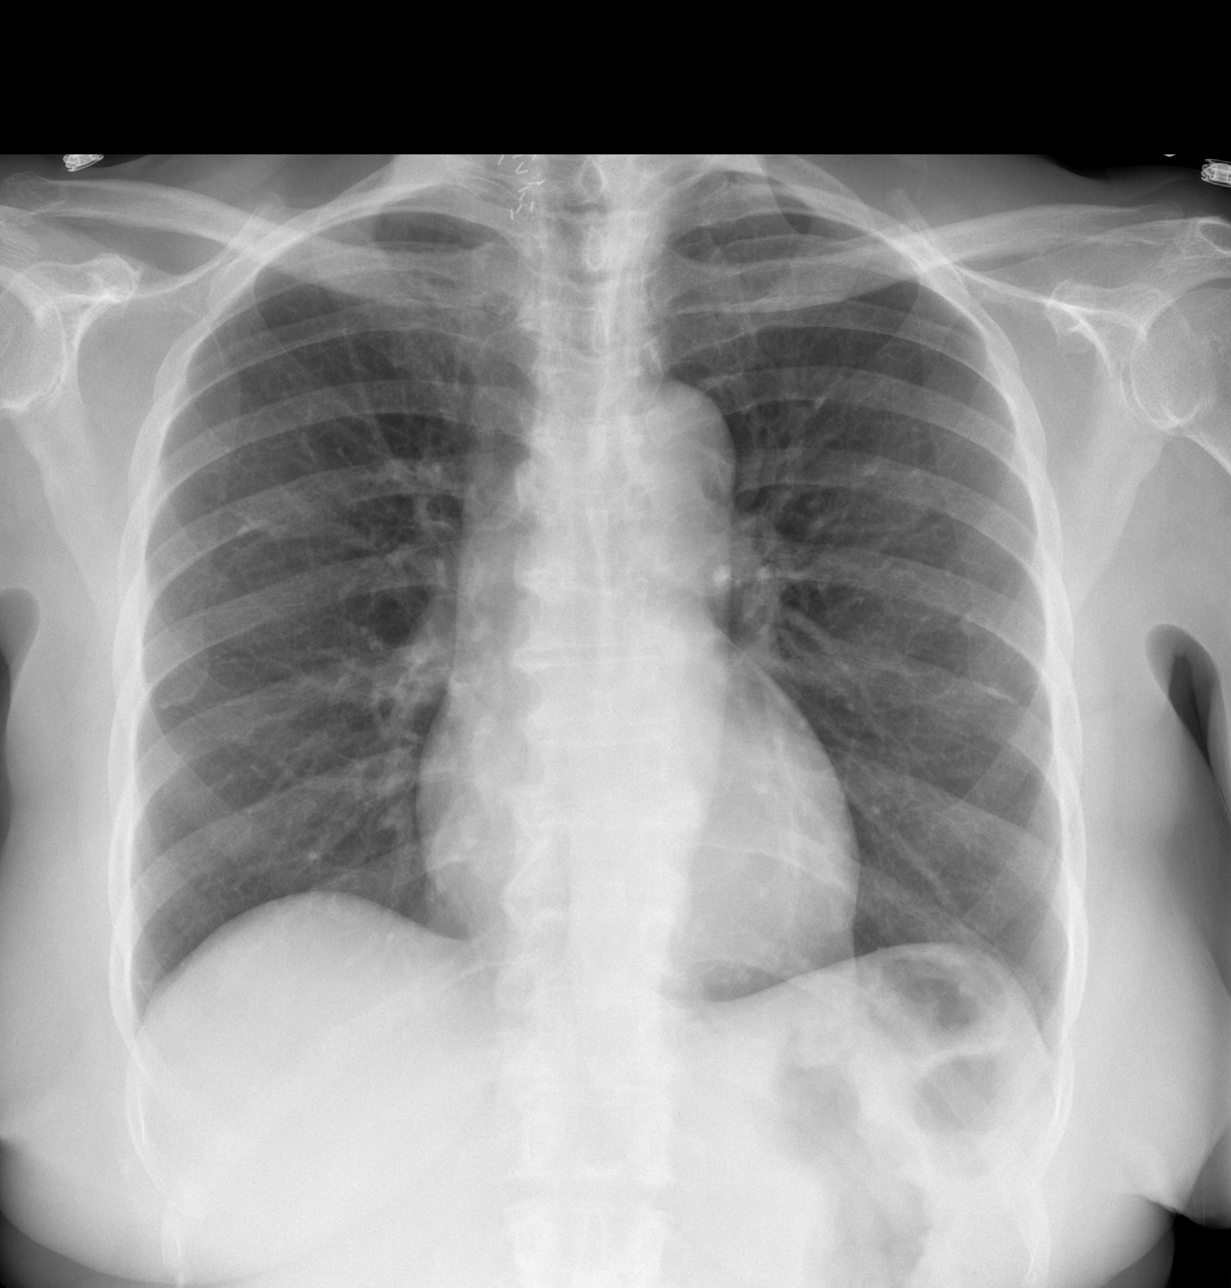

[w abdomen upright]
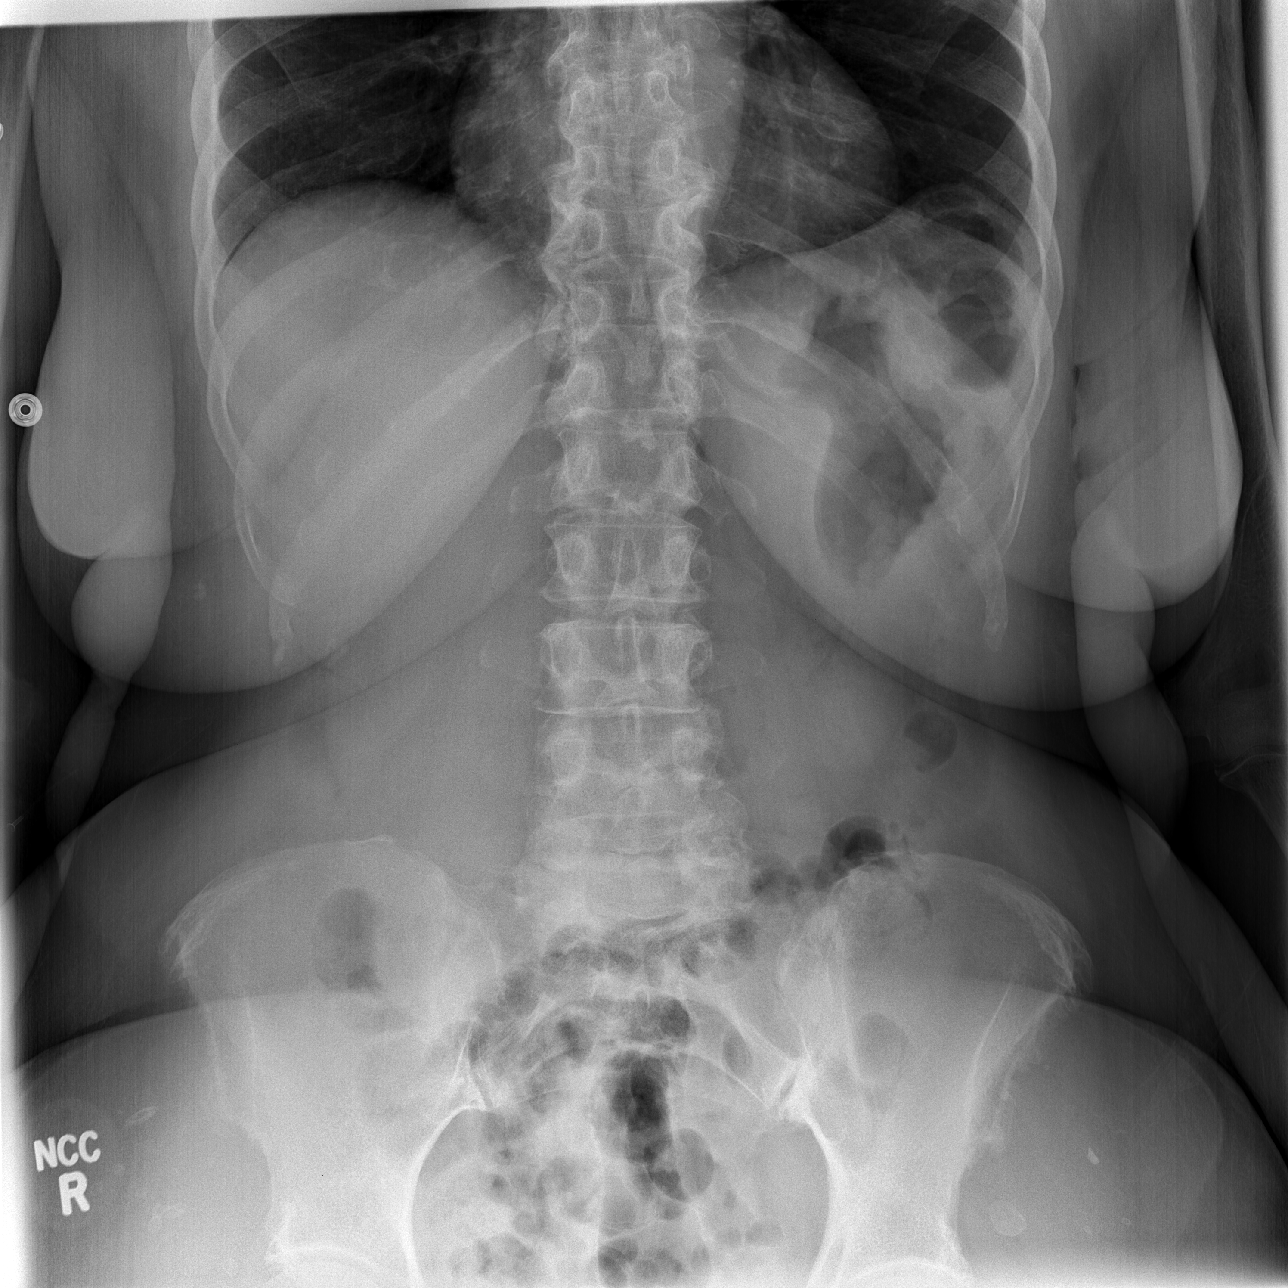

[t abdomen supine]
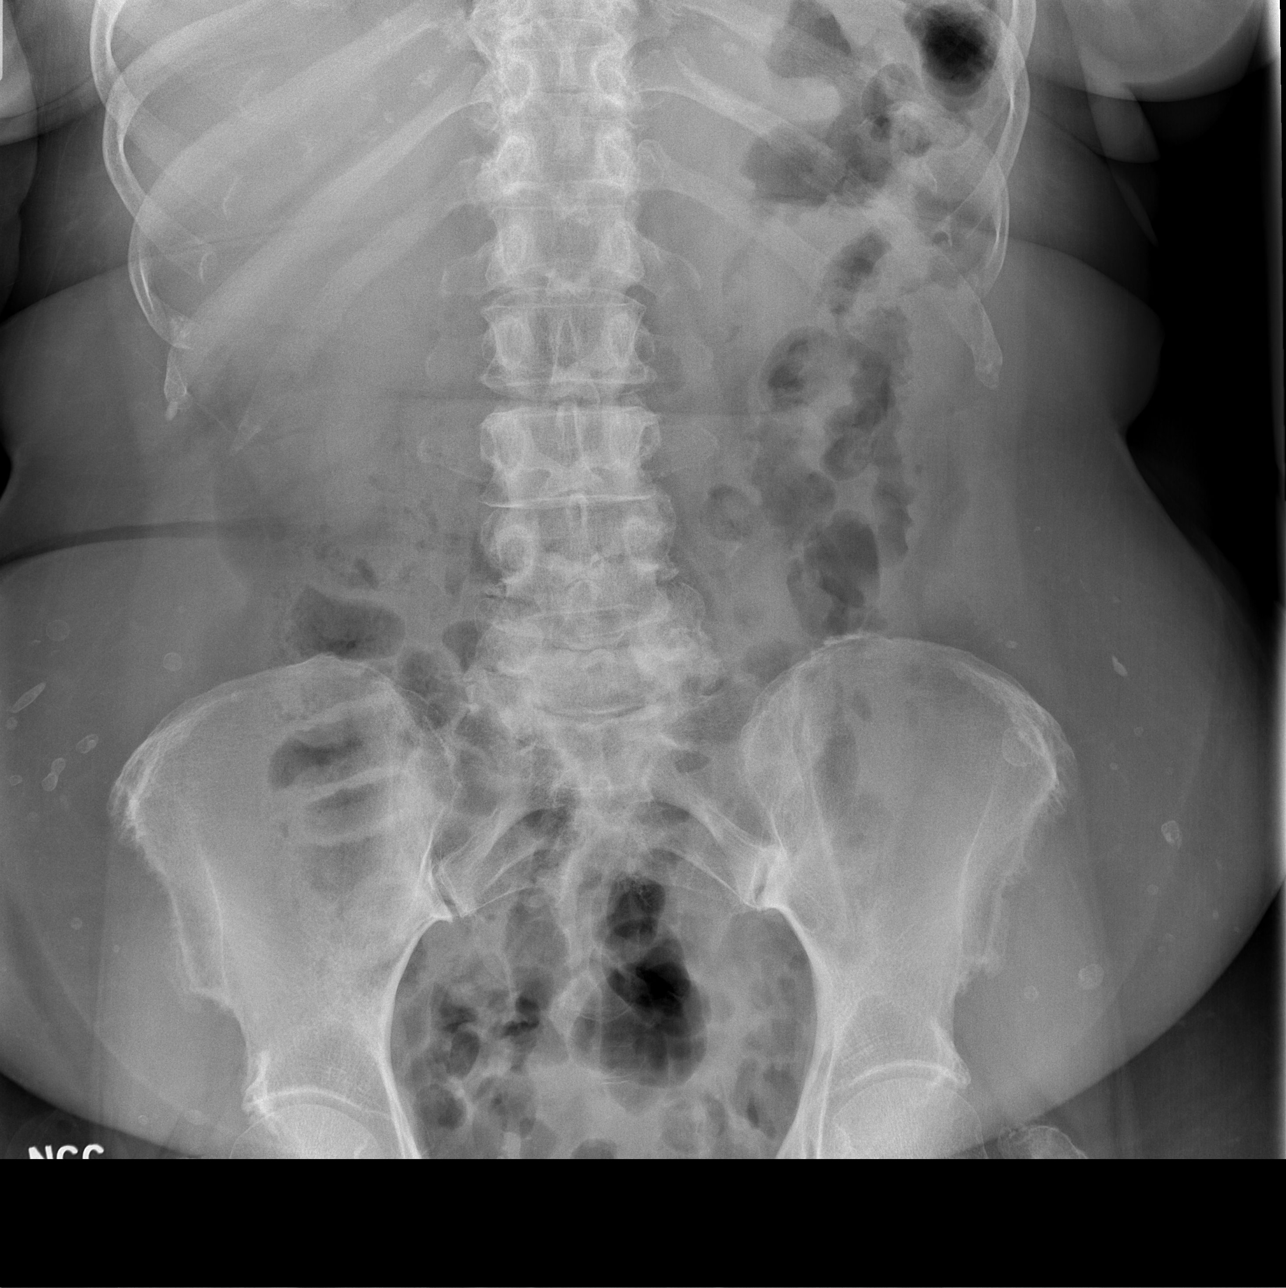

[3 of 3 positions shown; findings below may reference images not displayed]

FINDINGS: Normal heart size, mediastinal contours, and pulmonary vascularity.
Lungs clear.
No pleural effusion or pneumothorax.
Surgical clips in the right cervical region question prior thyroid
surgery.

Normal bowel gas pattern.
No bowel dilatation or bowel wall thickening.
No free intraperitoneal air.
Bilateral pelvic phleboliths.
Degenerative disc and facet disease changes lower lumbar spine.
Osseous demineralization.
No urinary tract calcification.
IMPRESSION: No acute abnormalities.

## 2012-06-09 MED ORDER — GI COCKTAIL ~~LOC~~
30.0000 mL | Freq: Once | ORAL | Status: AC
  Administered 2012-06-09: 30 mL via ORAL
  Filled 2012-06-09: qty 30

## 2012-06-09 MED ORDER — SODIUM CHLORIDE 0.9 % IV BOLUS (SEPSIS)
1000.0000 mL | Freq: Once | INTRAVENOUS | Status: AC
  Administered 2012-06-09: 1000 mL via INTRAVENOUS

## 2012-06-09 MED ORDER — POTASSIUM CHLORIDE 10 MEQ/100ML IV SOLN
10.0000 meq | Freq: Once | INTRAVENOUS | Status: AC
  Administered 2012-06-09: 10 meq via INTRAVENOUS
  Filled 2012-06-09: qty 100

## 2012-06-09 MED ORDER — PANTOPRAZOLE SODIUM 40 MG IV SOLR
40.0000 mg | Freq: Once | INTRAVENOUS | Status: AC
  Administered 2012-06-09: 40 mg via INTRAVENOUS
  Filled 2012-06-09: qty 40

## 2012-06-09 MED ORDER — POTASSIUM CHLORIDE CRYS ER 20 MEQ PO TBCR
40.0000 meq | EXTENDED_RELEASE_TABLET | Freq: Once | ORAL | Status: AC
  Administered 2012-06-09: 40 meq via ORAL
  Filled 2012-06-09: qty 2

## 2012-06-09 MED ORDER — FAMOTIDINE IN NACL 20-0.9 MG/50ML-% IV SOLN
20.0000 mg | Freq: Once | INTRAVENOUS | Status: AC
  Administered 2012-06-09: 20 mg via INTRAVENOUS
  Filled 2012-06-09: qty 50

## 2012-06-09 NOTE — ED Notes (Signed)
Pt complaining of left sided abdominal pain described as severe since Friday, denies any N/V/D.  Denies any difficulty urinating.  Pt has had the pain in the past and has a GI doctor, has an appointment with them tomorrow, was told in the past this is acid reflux.

## 2012-06-09 NOTE — ED Notes (Signed)
Per pt sts RUQ pain since Friday. sts diarrhea and it feels like something is tight and pulling. sts she has been eating soup

## 2012-06-09 NOTE — ED Provider Notes (Signed)
History     CSN: [id number]  Arrival date & time 06/09/12  1431   First MD Initiated Contact with Patient 06/09/12 1522      Chief Complaint  Patient presents with  . Abdominal Pain    (Consider location/radiation/quality/duration/timing/severity/associated sxs/prior treatment) The history is provided by the patient.  Angelica [name] is a 72 y.o. female history hypertension, GERD presenting with worsening of her chronic abdominal pain. Chronic left lower quadrant pain, she felt like it was gas. For the last 3 days she's been having worsening of the pain and the pain is intermittent. She also has some diarrhea. Denies any vomiting or nausea. She took some omeprazole but did not help. She has been followed up with her GI  Doctor and had multiple endoscopy and colonoscopy that were unremarkable. She will see her GI doctor tomorrow.    Past Medical History  Diagnosis Date  . Hypertension   . Asthma   . Environmental allergies   . GERD (gastroesophageal reflux disease)     Past Surgical History  Procedure Date  . Knee surgery   . Nasal sinus surgery   . Thyroid surgery     History reviewed. No pertinent family history.  History  Substance Use Topics  . Smoking status: Former Games developer  . Smokeless tobacco: Not on file  . Alcohol Use: No    OB History    Grav Para Term Preterm Abortions TAB SAB Ect Mult Living                  Review of Systems  Gastrointestinal: Positive for abdominal pain and diarrhea.  All other systems reviewed and are negative.    Allergies  Asa buff (mag; Fish allergy; Peanut-containing drug products; Penicillins; Shellfish-derived products; and Strawberry  Home Medications   Current Outpatient Rx  Name  Route  Sig  Dispense  Refill  . ACETAMINOPHEN 500 MG PO TABS   Oral   Take 500 mg by mouth daily as needed. For pain         . ALBUTEROL SULFATE (2.5 MG/3ML) 0.083% IN NEBU   Nebulization   Take 2.5 mg by nebulization 2 (two) times  daily as needed. For shortness of breath or wheezing         . AZELASTINE HCL 0.15 % NA SOLN   Nasal   Place 1 spray into the nose daily.          . BUDESONIDE-FORMOTEROL FUMARATE 160-4.5 MCG/ACT IN AERO   Inhalation   Inhale 2 puffs into the lungs daily.         Marland Kitchen LORATADINE 10 MG PO TABS   Oral   Take 10 mg by mouth daily as needed. For allergy symptoms         . LOSARTAN POTASSIUM-HCTZ 100-12.5 MG PO TABS   Oral   Take 1 tablet by mouth daily.         Marland Kitchen OMEPRAZOLE 20 MG PO CPDR   Oral   Take 20 mg by mouth 2 (two) times daily.           BP 128/77  Pulse 64  Temp 98.3 F (36.8 C) (Oral)  Resp 17  SpO2 97%  Physical Exam  Nursing note and vitals reviewed. Constitutional: She is oriented to person, place, and time. She appears well-developed and well-nourished.  HENT:  Head: Normocephalic.  Mouth/Throat: Oropharynx is clear and moist.  Eyes: Conjunctivae normal are normal. Pupils are equal, round, and reactive to light.  Neck: Normal range of motion. Neck supple.  Cardiovascular: Normal rate, regular rhythm and normal heart sounds.   Pulmonary/Chest: Effort normal and breath sounds normal. No respiratory distress. She has no wheezes. She has no rales.  Abdominal: Soft. Bowel sounds are normal. She exhibits no distension. There is no tenderness. There is no rebound.       Nontender, soft   Musculoskeletal: Normal range of motion.  Neurological: She is alert and oriented to person, place, and time.  Skin: Skin is warm and dry.  Psychiatric: She has a normal mood and affect. Her behavior is normal. Judgment and thought content normal.    ED Course  Procedures (including critical care time)  Labs Reviewed  COMPREHENSIVE METABOLIC PANEL - Abnormal; Notable for the following:    Potassium 3.0 (*)     Glucose, Bld 100 (*)     GFR calc non Af Amer 83 (*)     All other components within normal limits  URINALYSIS, ROUTINE W REFLEX MICROSCOPIC - Abnormal;  Notable for the following:    APPearance HAZY (*)     Leukocytes, UA TRACE (*)     All other components within normal limits  URINE MICROSCOPIC-ADD ON - Abnormal; Notable for the following:    Squamous Epithelial / LPF MANY (*)     Bacteria, UA FEW (*)     All other components within normal limits  CBC WITH DIFFERENTIAL  LIPASE, BLOOD  URINE CULTURE   Dg Abd Acute W/chest  06/09/2012  *RADIOLOGY REPORT*  Clinical Data: Left side abdominal pain, chronic constipation, history asthma, hypertension, gastritis  ACUTE ABDOMEN SERIES (ABDOMEN 2 VIEW & CHEST 1 VIEW)  Comparison: 07/19/2010  Findings: Normal heart size, mediastinal contours, and pulmonary vascularity. Lungs clear. No pleural effusion or pneumothorax. Surgical clips in the right cervical region question prior thyroid surgery.  Normal bowel gas pattern. No bowel dilatation or bowel wall thickening. No free intraperitoneal air. Bilateral pelvic phleboliths. Degenerative disc and facet disease changes lower lumbar spine. Osseous demineralization. No urinary tract calcification.  IMPRESSION: No acute abnormalities.   Original Report Authenticated By: Ulyses Southward, M.D.      No diagnosis found.    MDM  Angelica [name] is a 72 y.o. female here with worsening chronic ab pain. Will get xrays to r/o gas. Will get labs and give IVF and GI cocktail and reassess.   6:30 PM Patient felt better. K 3.0, supplemented. Abdomen soft and non tender. Will recommend eating bananas, prn imodium for diarrhea, PO hydration and GI f/u.        Richardean Canal, MD 06/09/12 [phone]

## 2012-06-10 LAB — URINE CULTURE
Colony Count: NO GROWTH
Culture: NO GROWTH

## 2012-06-19 ENCOUNTER — Ambulatory Visit (HOSPITAL_COMMUNITY)
Admission: RE | Admit: 2012-06-19 | Discharge: 2012-06-19 | Disposition: A | Discharge status: Home / Self Care | Payer: Medicare HMO | Source: Ambulatory Visit | Attending: Internal Medicine | Admitting: Internal Medicine

## 2012-06-19 DIAGNOSIS — Z1231 Encounter for screening mammogram for malignant neoplasm of breast: Secondary | ICD-10-CM | POA: Insufficient documentation

## 2012-10-07 ENCOUNTER — Encounter (HOSPITAL_COMMUNITY): Payer: Self-pay | Admitting: Emergency Medicine

## 2012-10-07 ENCOUNTER — Emergency Department (HOSPITAL_COMMUNITY)
Admission: EM | Admit: 2012-10-07 | Discharge: 2012-10-07 | Disposition: A | Discharge status: Home / Self Care | Payer: Medicare HMO | Attending: Emergency Medicine | Admitting: Emergency Medicine

## 2012-10-07 DIAGNOSIS — K219 Gastro-esophageal reflux disease without esophagitis: Secondary | ICD-10-CM | POA: Insufficient documentation

## 2012-10-07 DIAGNOSIS — IMO0002 Reserved for concepts with insufficient information to code with codable children: Secondary | ICD-10-CM | POA: Insufficient documentation

## 2012-10-07 DIAGNOSIS — Y939 Activity, unspecified: Secondary | ICD-10-CM | POA: Insufficient documentation

## 2012-10-07 DIAGNOSIS — X58XXXA Exposure to other specified factors, initial encounter: Secondary | ICD-10-CM | POA: Insufficient documentation

## 2012-10-07 DIAGNOSIS — Z79899 Other long term (current) drug therapy: Secondary | ICD-10-CM | POA: Insufficient documentation

## 2012-10-07 DIAGNOSIS — I10 Essential (primary) hypertension: Secondary | ICD-10-CM | POA: Insufficient documentation

## 2012-10-07 DIAGNOSIS — Z87891 Personal history of nicotine dependence: Secondary | ICD-10-CM | POA: Insufficient documentation

## 2012-10-07 DIAGNOSIS — Z9109 Other allergy status, other than to drugs and biological substances: Secondary | ICD-10-CM | POA: Insufficient documentation

## 2012-10-07 DIAGNOSIS — J45909 Unspecified asthma, uncomplicated: Secondary | ICD-10-CM | POA: Insufficient documentation

## 2012-10-07 DIAGNOSIS — S46911A Strain of unspecified muscle, fascia and tendon at shoulder and upper arm level, right arm, initial encounter: Secondary | ICD-10-CM

## 2012-10-07 DIAGNOSIS — Y929 Unspecified place or not applicable: Secondary | ICD-10-CM | POA: Insufficient documentation

## 2012-10-07 MED ORDER — IBUPROFEN 200 MG PO TABS
600.0000 mg | ORAL_TABLET | Freq: Once | ORAL | Status: AC
  Administered 2012-10-07: 600 mg via ORAL
  Filled 2012-10-07: qty 3

## 2012-10-07 MED ORDER — OXYCODONE-ACETAMINOPHEN 5-325 MG PO TABS
1.0000 | ORAL_TABLET | Freq: Once | ORAL | Status: AC
  Administered 2012-10-07: 1 via ORAL
  Filled 2012-10-07: qty 1

## 2012-10-07 MED ORDER — OXYCODONE-ACETAMINOPHEN 5-325 MG PO TABS: 1.0000 | ORAL_TABLET | ORAL | Status: DC | PRN

## 2012-10-07 MED ORDER — IBUPROFEN 400 MG PO TABS: 400.0000 mg | ORAL_TABLET | Freq: Once | ORAL | Status: DC

## 2012-10-07 MED ORDER — OXYCODONE-ACETAMINOPHEN 5-325 MG PO TABS: 1.0000 | ORAL_TABLET | Freq: Once | ORAL | Status: DC

## 2012-10-07 MED ORDER — IBUPROFEN 400 MG PO TABS: 400.0000 mg | ORAL_TABLET | Freq: Four times a day (QID) | ORAL | Status: DC | PRN

## 2012-10-07 NOTE — ED Provider Notes (Signed)
History    [name] with R shoulder pain. Gradual onset about 2 weeks  ago. Denies trauma. Pain constant and worse when she moves shoulder or touches area. No fever or chills. No rash. No numbness or tingling. Denies neck pain. No swelling. Denies CP or SOB. No back pain.   CSN: [id number]  Arrival date & time 10/07/12  0808   First MD Initiated Contact with Patient 10/07/12 0815      Chief Complaint  Patient presents with  . Shoulder Pain    (Consider location/radiation/quality/duration/timing/severity/associated sxs/prior treatment) HPI  Past Medical History  Diagnosis Date  . Hypertension   . Asthma   . Environmental allergies   . GERD (gastroesophageal reflux disease)     Past Surgical History  Procedure Laterality Date  . Knee surgery    . Nasal sinus surgery    . Thyroid surgery      No family history on file.  History  Substance Use Topics  . Smoking status: Former Games developer  . Smokeless tobacco: Not on file  . Alcohol Use: No    OB History   Grav Para Term Preterm Abortions TAB SAB Ect Mult Living                  Review of Systems  All systems reviewed and negative, other than as noted in HPI.   Allergies  Asa buff (mag; Fish allergy; Peanut-containing drug products; Penicillins; Shellfish-derived products; and Strawberry  Home Medications   Current Outpatient Rx  Name  Route  Sig  Dispense  Refill  . acetaminophen (TYLENOL) 500 MG tablet   Oral   Take 500 mg by mouth daily as needed. For pain         . albuterol (PROVENTIL) (2.5 MG/3ML) 0.083% nebulizer solution   Nebulization   Take 2.5 mg by nebulization 2 (two) times daily as needed. For shortness of breath or wheezing         . Azelastine HCl (ASTEPRO) 0.15 % SOLN   Nasal   Place 1 spray into the nose daily.          . budesonide-formoterol (SYMBICORT) 160-4.5 MCG/ACT inhaler   Inhalation   Inhale 2 puffs into the lungs daily.         Marland Kitchen loratadine (CLARITIN) 10 MG tablet  Oral   Take 10 mg by mouth daily as needed. For allergy symptoms         . losartan-hydrochlorothiazide (HYZAAR) 100-12.5 MG per tablet   Oral   Take 1 tablet by mouth daily.         Marland Kitchen omeprazole (PRILOSEC) 20 MG capsule   Oral   Take 20 mg by mouth 2 (two) times daily.           BP 138/77  Temp(Src) 97.9 F (36.6 C) (Oral)  Resp 20  Ht 5\' 5"  (1.651 m)  Wt 179 lb (81.194 kg)  BMI 29.79 kg/m2  Physical Exam  Nursing note and vitals reviewed. Constitutional: She appears well-developed and well-nourished. No distress.  HENT:  Head: Normocephalic and atraumatic.  Eyes: Conjunctivae are normal. Right eye exhibits no discharge. Left eye exhibits no discharge.  Neck: Neck supple.  Cardiovascular: Normal rate, regular rhythm and normal heart sounds.  Exam reveals no gallop and no friction rub.   No murmur heard. Pulmonary/Chest: Effort normal and breath sounds normal. No respiratory distress.  Abdominal: Soft. She exhibits no distension. There is no tenderness.  Musculoskeletal: She exhibits tenderness. She exhibits no edema.  Tenderness R shoulder in area of AC joint. No overlying skin changes. Pain with ROM of shoulder, particularly abduction. NVI distally.   Neurological: She is alert.  Skin: Skin is warm and dry.  Psychiatric: She has a normal mood and affect. Her behavior is normal. Thought content normal.    ED Course  Procedures (including critical care time)  Labs Reviewed - No data to display No results found.   1. Shoulder strain, right, initial encounter       MDM  [name] with atraumatic R shoulder pain. Suspect musculoskeletal. Not consistent with ACS. Imaging likely of little utility. Plan symtpomatic tx.         Raeford Razor, MD 10/16/12 [phone]

## 2012-10-07 NOTE — ED Notes (Signed)
Pt states that she has a "knot, sore place" in her right shoulder.  Denies injury.  States that she has been having this pain for about 2 wks.

## 2013-05-03 ENCOUNTER — Emergency Department (HOSPITAL_COMMUNITY)
Admission: EM | Admit: 2013-05-03 | Discharge: 2013-05-03 | Disposition: A | Discharge status: Home / Self Care | Payer: Medicare HMO | Attending: Emergency Medicine | Admitting: Emergency Medicine

## 2013-05-03 ENCOUNTER — Encounter (HOSPITAL_COMMUNITY): Payer: Self-pay | Admitting: Emergency Medicine

## 2013-05-03 ENCOUNTER — Emergency Department (HOSPITAL_COMMUNITY): Payer: Medicare HMO

## 2013-05-03 DIAGNOSIS — J029 Acute pharyngitis, unspecified: Secondary | ICD-10-CM | POA: Insufficient documentation

## 2013-05-03 DIAGNOSIS — Z880 Allergy status to penicillin: Secondary | ICD-10-CM | POA: Insufficient documentation

## 2013-05-03 DIAGNOSIS — J40 Bronchitis, not specified as acute or chronic: Secondary | ICD-10-CM

## 2013-05-03 DIAGNOSIS — J45901 Unspecified asthma with (acute) exacerbation: Secondary | ICD-10-CM | POA: Insufficient documentation

## 2013-05-03 DIAGNOSIS — J040 Acute laryngitis: Secondary | ICD-10-CM

## 2013-05-03 DIAGNOSIS — Z87891 Personal history of nicotine dependence: Secondary | ICD-10-CM | POA: Insufficient documentation

## 2013-05-03 DIAGNOSIS — IMO0002 Reserved for concepts with insufficient information to code with codable children: Secondary | ICD-10-CM | POA: Insufficient documentation

## 2013-05-03 DIAGNOSIS — R5381 Other malaise: Secondary | ICD-10-CM | POA: Insufficient documentation

## 2013-05-03 DIAGNOSIS — I10 Essential (primary) hypertension: Secondary | ICD-10-CM | POA: Insufficient documentation

## 2013-05-03 DIAGNOSIS — Z79899 Other long term (current) drug therapy: Secondary | ICD-10-CM | POA: Insufficient documentation

## 2013-05-03 DIAGNOSIS — K219 Gastro-esophageal reflux disease without esophagitis: Secondary | ICD-10-CM | POA: Insufficient documentation

## 2013-05-03 IMAGING — CR DG CHEST 2V
2 series · 2 of 2 positions shown · non-contrast
Comparison: [DATE], [DATE], [DATE].

CLINICAL DATA: Cough. Right-sided chest pain. Former smoker.
Environmental allergies.

EXAM:
CHEST  2 VIEW

[w chest pa]
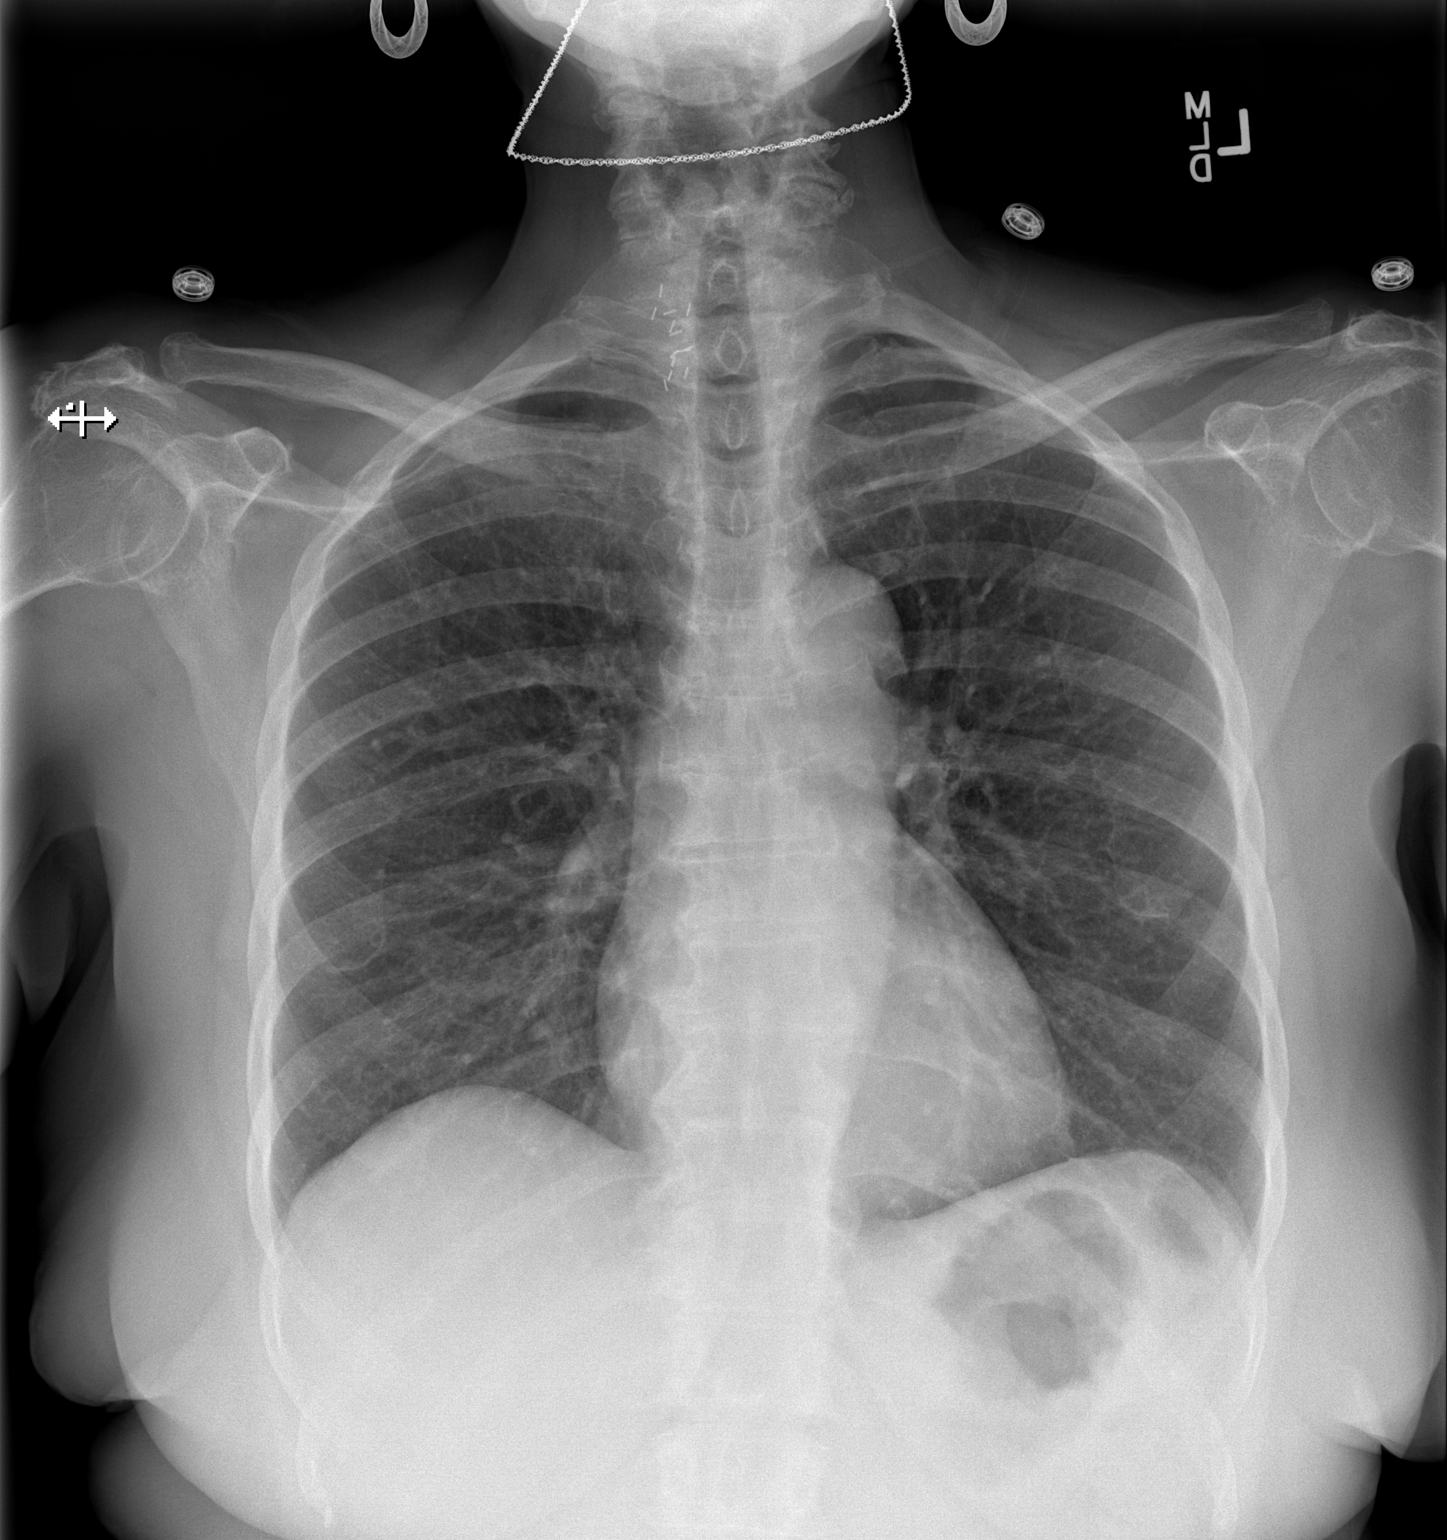

[w chest lat]
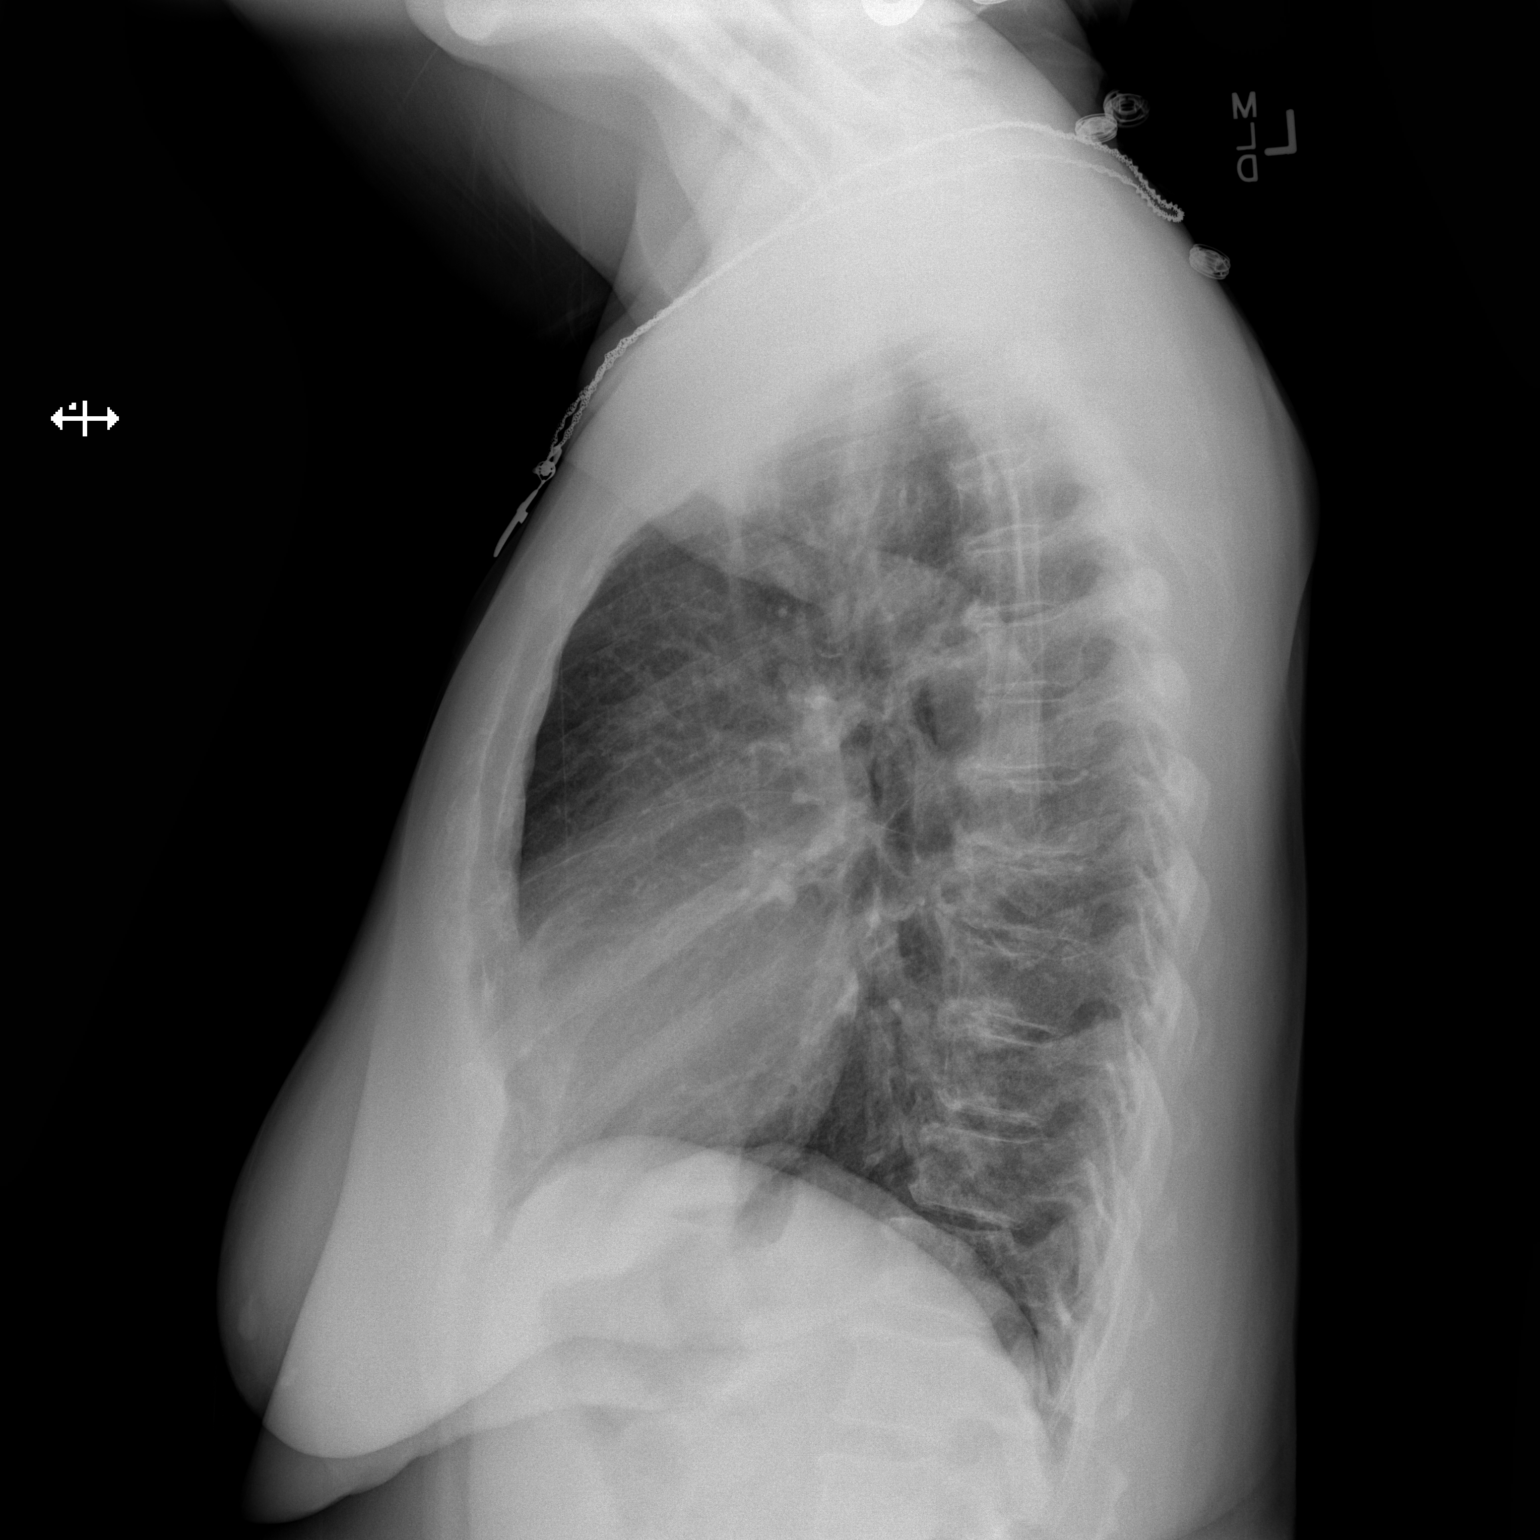

[2 of 2 positions shown; findings below may reference images not displayed]

FINDINGS: Cardiomediastinal silhouette unremarkable and unchanged. Mildly
prominent bronchovascular markings diffusely and mild central
peribronchial thickening, more so than on the prior examinations.
Lungs otherwise clear. No localized airspace consolidation. No
pleural effusions. No pneumothorax. Normal pulmonary vascularity.
Degenerative changes throughout the thoracic spine.
IMPRESSION: Mild changes of acute bronchitis and/or asthma without localized
airspace pneumonia.

## 2013-05-03 MED ORDER — BENZONATATE 100 MG PO CAPS
100.0000 mg | ORAL_CAPSULE | Freq: Once | ORAL | Status: AC
  Administered 2013-05-03: 100 mg via ORAL
  Filled 2013-05-03: qty 1

## 2013-05-03 MED ORDER — PREDNISONE 20 MG PO TABS: 40.0000 mg | ORAL_TABLET | Freq: Every day | ORAL | Status: DC

## 2013-05-03 MED ORDER — BENZONATATE 100 MG PO CAPS: 100.0000 mg | ORAL_CAPSULE | Freq: Three times a day (TID) | ORAL | Status: DC

## 2013-05-03 MED ORDER — IPRATROPIUM BROMIDE 0.02 % IN SOLN
0.5000 mg | Freq: Once | RESPIRATORY_TRACT | Status: AC
  Administered 2013-05-03: 0.5 mg via RESPIRATORY_TRACT
  Filled 2013-05-03: qty 2.5

## 2013-05-03 MED ORDER — ALBUTEROL SULFATE (5 MG/ML) 0.5% IN NEBU
5.0000 mg | INHALATION_SOLUTION | Freq: Once | RESPIRATORY_TRACT | Status: AC
  Administered 2013-05-03: 5 mg via RESPIRATORY_TRACT
  Filled 2013-05-03: qty 1

## 2013-05-03 MED ORDER — PREDNISONE 20 MG PO TABS
40.0000 mg | ORAL_TABLET | Freq: Once | ORAL | Status: AC
  Administered 2013-05-03: 40 mg via ORAL
  Filled 2013-05-03: qty 2

## 2013-05-03 NOTE — Discharge Instructions (Signed)
Bronchitis Bronchitis is the body's way of reacting to injury and/or infection (inflammation) of the bronchi. Bronchi are the air tubes that extend from the windpipe into the lungs. If the inflammation becomes severe, it may cause shortness of breath. CAUSES  Inflammation may be caused by:  A virus.  Germs (bacteria).  Dust.  Allergens.  Pollutants and many other irritants. The cells lining the bronchial tree are covered with tiny hairs (cilia). These constantly beat upward, away from the lungs, toward the mouth. This keeps the lungs free of pollutants. When these cells become too irritated and are unable to do their job, mucus begins to develop. This causes the characteristic cough of bronchitis. The cough clears the lungs when the cilia are unable to do their job. Without either of these protective mechanisms, the mucus would settle in the lungs. Then you would develop pneumonia. Smoking is a common cause of bronchitis and can contribute to pneumonia. Stopping this habit is the single most important thing you can do to help yourself. TREATMENT   Your caregiver may prescribe an antibiotic if the cough is caused by bacteria. Also, medicines that open up your airways make it easier to breathe. Your caregiver may also recommend or prescribe an expectorant. It will loosen the mucus to be coughed up. Only take over-the-counter or prescription medicines for pain, discomfort, or fever as directed by your caregiver.  Removing whatever causes the problem (smoking, for example) is critical to preventing the problem from getting worse.  Cough suppressants may be prescribed for relief of cough symptoms.  Inhaled medicines may be prescribed to help with symptoms now and to help prevent problems from returning.  For those with recurrent (chronic) bronchitis, there may be a need for steroid medicines. SEEK IMMEDIATE MEDICAL CARE IF:   During treatment, you develop more pus-like mucus (purulent  sputum).  You have a fever.  You become progressively more ill.  You have increased difficulty breathing, wheezing, or shortness of breath. It is necessary to seek immediate medical care if you are elderly or sick from any other disease. MAKE SURE YOU:   Understand these instructions.  Will watch your condition.  Will get help right away if you are not doing well or get worse. Document Released: 05/07/2005 Document Revised: 01/07/2013 Document Reviewed: 12/30/2012 [facility] Patient Information 2014 [place], [place]. Laryngitis At the top of your windpipe is your voice box. It is the source of your voice. Inside your voice box are 2 bands of muscles called vocal cords. When you breathe, your vocal cords are relaxed and open so that air can get into the lungs. When you decide to say something, these cords come together and vibrate. The sound from these vibrations goes into your throat and comes out through your mouth as sound. Laryngitis is an inflammation of the vocal cords that causes hoarseness, cough, loss of voice, sore throat, and dry throat. Laryngitis can be temporary (acute) or long-term (chronic). Most cases of acute laryngitis improve with time.Chronic laryngitis lasts for more than 3 weeks. CAUSES Laryngitis can often be related to excessive smoking, talking, or yelling, as well as inhalation of toxic fumes and allergies. Acute laryngitis is usually caused by a viral infection, vocal strain, measles or mumps, or bacterial infections. Chronic laryngitis is usually caused by vocal cord strain, vocal cord injury, postnasal drip, growths on the vocal cords, or acid reflux. SYMPTOMS   Cough.  Sore throat.  Dry throat. RISK FACTORS  Respiratory infections.  Exposure to irritating substances,  such as cigarette smoke, excessive amounts of alcohol, stomach acids, and workplace chemicals.  Voice trauma, such as vocal cord injury from shouting or speaking too loud. DIAGNOSIS  Your  cargiver will perform a physical exam. During the physical exam, your caregiver will examine your throat. The most common sign of laryngitis is hoarseness. Laryngoscopy may be necessary to confirm the diagnosis of this condition. This procedure allows your caregiver to look into the larynx. HOME CARE INSTRUCTIONS  Drink enough fluids to keep your urine clear or pale yellow.  Rest until you no longer have symptoms or as directed by your caregiver.  Breathe in moist air.  Take all medicine as directed by your caregiver.  Do not smoke.  Talk as little as possible (this includes whispering).  Write on paper instead of talking until your voice is back to normal.  Follow up with your caregiver if your condition has not improved after 10 days. SEEK MEDICAL CARE IF:   You have trouble breathing.  You cough up blood.  You have persistent fever.  You have increasing pain.  You have difficulty swallowing. MAKE SURE YOU:  Understand these instructions.  Will watch your condition.  Will get help right away if you are not doing well or get worse. Document Released: 05/07/2005 Document Revised: 07/30/2011 Document Reviewed: 07/13/2010 [facility] Patient Information 2014 [place], [place].

## 2013-05-03 NOTE — ED Provider Notes (Signed)
CSN: [id number]     Arrival date & time 05/03/13  1011 History   First MD Initiated Contact with Patient 05/03/13 1032     Chief Complaint  Patient presents with  . Cough  . Hoarse   (Consider location/radiation/quality/duration/timing/severity/associated sxs/prior Treatment) Patient is a 72 y.o. female presenting with cough. The history is provided by the patient and a relative.  Cough Cough characteristics:  Productive, non-productive and dry (cough iniitally more productie with yellow sputum, now dry over past few days) Severity:  Moderate Onset quality:  Gradual Duration:  5 days Timing:  Constant Progression:  Worsening Chronicity:  New Smoker: no   Context: sick contacts   Context comment:  Daughter with URI symptoms beginning about 2 weeks ago, improving, is on an abx now, but her symptoms were worse Relieved by:  Nothing Worsened by:  Deep breathing and activity Ineffective treatments:  Decongestant, beta-agonist inhaler and cough suppressants (allergy medications, nasal spray) Associated symptoms: chest pain, shortness of breath, sinus congestion, sore throat and wheezing   Associated symptoms: no chills, no fever, no headaches and no rash   Associated symptoms comment:  Voice is hoarse Chest pain:    Quality:  Sharp (pain only wtih coughing on right side, not consistent,)   Severity:  Mild   Timing:  Intermittent   Progression:  Unchanged   Chronicity:  New   Past Medical History  Diagnosis Date  . Hypertension   . Asthma   . Environmental allergies   . GERD (gastroesophageal reflux disease)    Past Surgical History  Procedure Laterality Date  . Knee surgery    . Nasal sinus surgery    . Thyroid surgery     History reviewed. No pertinent family history. History  Substance Use Topics  . Smoking status: Former Games developer  . Smokeless tobacco: Not on file  . Alcohol Use: No   OB History   Grav Para Term Preterm Abortions TAB SAB Ect Mult Living       Review of Systems  Constitutional: Positive for fatigue. Negative for fever and chills.  HENT: Positive for postnasal drip, sore throat and voice change. Negative for trouble swallowing.   Respiratory: Positive for cough, shortness of breath and wheezing.   Cardiovascular: Positive for chest pain.  Gastrointestinal: Negative for nausea, vomiting, abdominal pain, diarrhea and constipation.  Skin: Negative for rash.  Neurological: Negative for headaches.  All other systems reviewed and are negative.    Allergies  Asa buff (mag; Fish allergy; Peanut-containing drug products; Penicillins; Shellfish-derived products; and Strawberry  Home Medications   Current Outpatient Rx  Name  Route  Sig  Dispense  Refill  . acetaminophen (TYLENOL) 500 MG tablet   Oral   Take 500 mg by mouth daily as needed. For pain         . albuterol (PROVENTIL) (2.5 MG/3ML) 0.083% nebulizer solution   Nebulization   Take 2.5 mg by nebulization 2 (two) times daily as needed. For shortness of breath or wheezing         . Azelastine HCl (ASTEPRO) 0.15 % SOLN   Nasal   Place 1 spray into the nose daily.          . budesonide-formoterol (SYMBICORT) 160-4.5 MCG/ACT inhaler   Inhalation   Inhale 2 puffs into the lungs daily.         Marland Kitchen dextromethorphan-guaiFENesin (MUCINEX DM) 30-600 MG per 12 hr tablet   Oral   Take 1 tablet by mouth 2 (two) times  daily.         . loratadine (CLARITIN) 10 MG tablet   Oral   Take 10 mg by mouth daily as needed. For allergy symptoms         . losartan-hydrochlorothiazide (HYZAAR) 100-12.5 MG per tablet   Oral   Take 1 tablet by mouth daily.         Marland Kitchen omeprazole (PRILOSEC) 20 MG capsule   Oral   Take 20 mg by mouth 2 (two) times daily.         . benzonatate (TESSALON) 100 MG capsule   Oral   Take 1 capsule (100 mg total) by mouth every 8 (eight) hours.   21 capsule   0   . predniSONE (DELTASONE) 20 MG tablet   Oral   Take 2 tablets (40 mg  total) by mouth daily.   12 tablet   0    BP 108/69  Pulse 80  Temp(Src) 98.2 F (36.8 C) (Oral)  Resp 20  SpO2 100% Physical Exam  Nursing note and vitals reviewed. Constitutional: She appears well-developed and well-nourished.  HENT:  Head: Normocephalic and atraumatic.  Mouth/Throat: No oropharyngeal exudate.  Eyes: Conjunctivae and EOM are normal. No scleral icterus.  Neck: Normal range of motion. Neck supple.  Cardiovascular: Normal rate and intact distal pulses.   No murmur heard. Pulmonary/Chest: Effort normal. She has wheezes.  Mild exp wheeze, paroxysmal coughing during exam and with deep breath, dry  Abdominal: Soft. She exhibits no distension. There is no tenderness. There is no rebound.  Neurological: She is alert. She exhibits normal muscle tone. Coordination normal.  Skin: Skin is dry.  Psychiatric: She has a normal mood and affect.    ED Course  Procedures (including critical care time) Labs Review Labs Reviewed - No data to display Imaging Review Dg Chest 2 View  05/03/2013   CLINICAL DATA:  Cough. Right-sided chest pain. Former smoker. Environmental allergies.  EXAM: CHEST  2 VIEW  COMPARISON:  06/09/2012, 01/14/2010, 08/30/2009.  FINDINGS: Cardiomediastinal silhouette unremarkable and unchanged. Mildly prominent bronchovascular markings diffusely and mild central peribronchial thickening, more so than on the prior examinations. Lungs otherwise clear. No localized airspace consolidation. No pleural effusions. No pneumothorax. Normal pulmonary vascularity. Degenerative changes throughout the thoracic spine.  IMPRESSION: Mild changes of acute bronchitis and/or asthma without localized airspace pneumonia.   Electronically Signed   By: Hulan Saas M.D.   On: 05/03/2013 12:15    EKG Interpretation   None     RA sat is 100% and I interpret to be normal   1:45 PM Pt is breathing better, cough is improved, voice sounds better to her.  On exam, no exp  wheezing on auscultation, better air movement.  CXR as above, no pneumonia.  Pt can go home and follo wup with PCP.  Return if worse.     MDM   1. Bronchitis   2. Laryngitis     Pt was exposed to sick contact, symptoms are consistent with acute bronchitis.  Pt admits to post nasal drip also.  Will treat with nebs, give steroids, check CXR as she has indicated some occasional right side chest pain with coughing only.      Gavin Pound. Mable Lashley, MD 05/03/13 1345

## 2013-05-03 NOTE — ED Notes (Signed)
Pt states that cough started on Wed then got worse on Friday then yesterday she started having right side pain when she coughs and hoarse voice. Productive cough with yellow phlegm.

## 2013-05-26 ENCOUNTER — Other Ambulatory Visit: Payer: Self-pay | Admitting: Internal Medicine

## 2013-05-26 DIAGNOSIS — Z1231 Encounter for screening mammogram for malignant neoplasm of breast: Secondary | ICD-10-CM

## 2013-06-22 ENCOUNTER — Ambulatory Visit (HOSPITAL_COMMUNITY)
Admission: RE | Admit: 2013-06-22 | Discharge: 2013-06-22 | Disposition: A | Discharge status: Home / Self Care | Payer: Medicare HMO | Source: Ambulatory Visit | Attending: Internal Medicine | Admitting: Internal Medicine

## 2013-06-22 DIAGNOSIS — Z1231 Encounter for screening mammogram for malignant neoplasm of breast: Secondary | ICD-10-CM | POA: Insufficient documentation

## 2014-02-04 ENCOUNTER — Emergency Department (HOSPITAL_COMMUNITY)
Admission: EM | Admit: 2014-02-04 | Discharge: 2014-02-04 | Disposition: A | Discharge status: Home / Self Care | Payer: Medicare HMO | Attending: Emergency Medicine | Admitting: Emergency Medicine

## 2014-02-04 ENCOUNTER — Encounter (HOSPITAL_COMMUNITY): Payer: Self-pay | Admitting: Emergency Medicine

## 2014-02-04 ENCOUNTER — Emergency Department (HOSPITAL_COMMUNITY): Payer: Medicare HMO

## 2014-02-04 DIAGNOSIS — IMO0002 Reserved for concepts with insufficient information to code with codable children: Secondary | ICD-10-CM | POA: Insufficient documentation

## 2014-02-04 DIAGNOSIS — J069 Acute upper respiratory infection, unspecified: Secondary | ICD-10-CM | POA: Diagnosis not present

## 2014-02-04 DIAGNOSIS — J45909 Unspecified asthma, uncomplicated: Secondary | ICD-10-CM | POA: Insufficient documentation

## 2014-02-04 DIAGNOSIS — Z87891 Personal history of nicotine dependence: Secondary | ICD-10-CM | POA: Insufficient documentation

## 2014-02-04 DIAGNOSIS — I10 Essential (primary) hypertension: Secondary | ICD-10-CM | POA: Insufficient documentation

## 2014-02-04 DIAGNOSIS — Z880 Allergy status to penicillin: Secondary | ICD-10-CM | POA: Diagnosis not present

## 2014-02-04 DIAGNOSIS — R059 Cough, unspecified: Secondary | ICD-10-CM | POA: Diagnosis present

## 2014-02-04 DIAGNOSIS — Z79899 Other long term (current) drug therapy: Secondary | ICD-10-CM | POA: Diagnosis not present

## 2014-02-04 DIAGNOSIS — G8929 Other chronic pain: Secondary | ICD-10-CM | POA: Diagnosis not present

## 2014-02-04 DIAGNOSIS — R05 Cough: Secondary | ICD-10-CM | POA: Diagnosis present

## 2014-02-04 DIAGNOSIS — K219 Gastro-esophageal reflux disease without esophagitis: Secondary | ICD-10-CM | POA: Diagnosis not present

## 2014-02-04 DIAGNOSIS — R109 Unspecified abdominal pain: Secondary | ICD-10-CM

## 2014-02-04 LAB — URINALYSIS, ROUTINE W REFLEX MICROSCOPIC
Bilirubin Urine: NEGATIVE
Glucose, UA: NEGATIVE mg/dL
Hgb urine dipstick: NEGATIVE
Ketones, ur: NEGATIVE mg/dL
Leukocytes, UA: NEGATIVE
Nitrite: NEGATIVE
Protein, ur: NEGATIVE mg/dL
Specific Gravity, Urine: 1.006 (ref 1.005–1.030)
Urobilinogen, UA: 0.2 mg/dL (ref 0.0–1.0)
pH: 6.5 (ref 5.0–8.0)

## 2014-02-04 LAB — CBC WITH DIFFERENTIAL/PLATELET
Basophils Absolute: 0 10*3/uL (ref 0.0–0.1)
Basophils Relative: 1 % (ref 0–1)
Eosinophils Absolute: 0.4 10*3/uL (ref 0.0–0.7)
Eosinophils Relative: 5 % (ref 0–5)
HCT: 39.1 % (ref 36.0–46.0)
Hemoglobin: 13.4 g/dL (ref 12.0–15.0)
Lymphocytes Relative: 44 % (ref 12–46)
Lymphs Abs: 3.7 10*3/uL (ref 0.7–4.0)
MCH: 30.2 pg (ref 26.0–34.0)
MCHC: 34.3 g/dL (ref 30.0–36.0)
MCV: 88.1 fL (ref 78.0–100.0)
Monocytes Absolute: 0.6 10*3/uL (ref 0.1–1.0)
Monocytes Relative: 7 % (ref 3–12)
Neutro Abs: 3.5 10*3/uL (ref 1.7–7.7)
Neutrophils Relative %: 43 % (ref 43–77)
Platelets: 281 10*3/uL (ref 150–400)
RBC: 4.44 MIL/uL (ref 3.87–5.11)
RDW: 13.3 % (ref 11.5–15.5)
WBC: 8.1 10*3/uL (ref 4.0–10.5)

## 2014-02-04 LAB — COMPREHENSIVE METABOLIC PANEL
ALT: 10 U/L (ref 0–35)
AST: 20 U/L (ref 0–37)
Albumin: 3.5 g/dL (ref 3.5–5.2)
Alkaline Phosphatase: 90 U/L (ref 39–117)
Anion gap: 11 (ref 5–15)
BUN: 12 mg/dL (ref 6–23)
CO2: 27 mEq/L (ref 19–32)
Calcium: 9.2 mg/dL (ref 8.4–10.5)
Chloride: 100 mEq/L (ref 96–112)
Creatinine, Ser: 1.1 mg/dL (ref 0.50–1.10)
GFR calc Af Amer: 56 mL/min — ABNORMAL LOW (ref >90)
GFR calc non Af Amer: 48 mL/min — ABNORMAL LOW (ref >90)
Glucose, Bld: 114 mg/dL — ABNORMAL HIGH (ref 70–99)
Potassium: 4.2 mEq/L (ref 3.7–5.3)
Sodium: 138 mEq/L (ref 137–147)
Total Bilirubin: 0.4 mg/dL (ref 0.3–1.2)
Total Protein: 7.4 g/dL (ref 6.0–8.3)

## 2014-02-04 LAB — TROPONIN I: Troponin I: <0.3 ng/mL (ref <0.30)

## 2014-02-04 LAB — LIPASE, BLOOD: Lipase: 29 U/L (ref 11–59)

## 2014-02-04 IMAGING — US US ABDOMEN COMPLETE
1 series · 14 of 25 positions shown · non-contrast
Comparison: None.

CLINICAL DATA: 73-year-old female with right-sided abdominal pain.

EXAM:
ULTRASOUND ABDOMEN COMPLETE

[Series 1: us abdomen complete · 0.26mm/px · 14 of 42 slices shown]
[im 1/42]
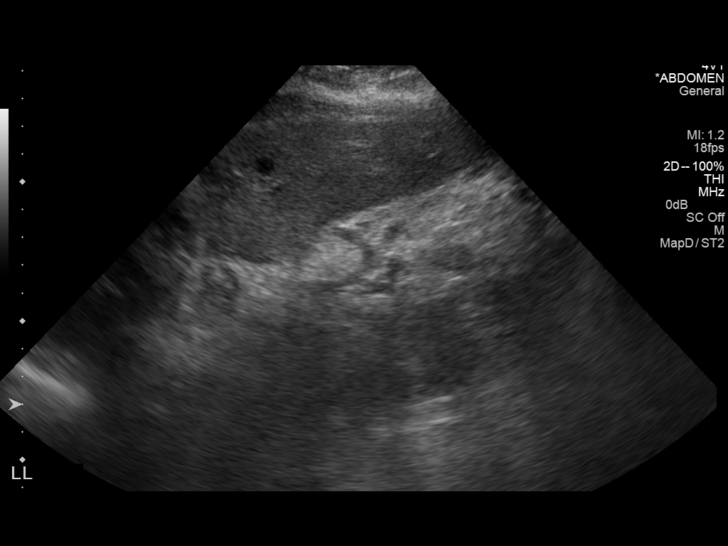
[im 4/42]
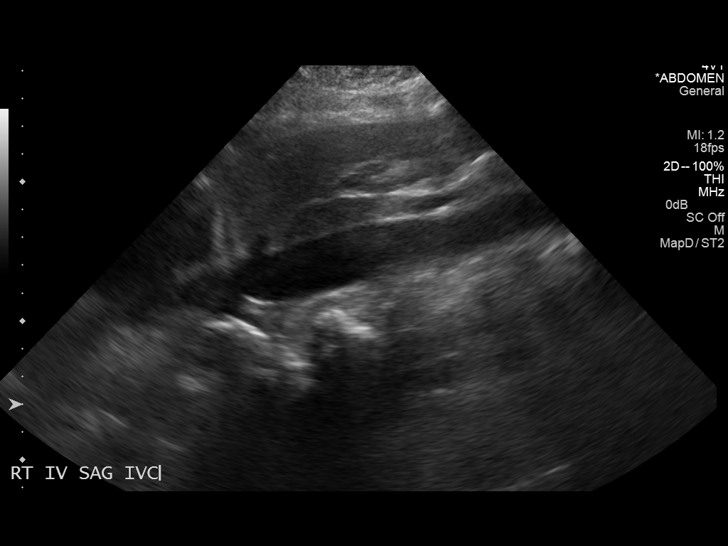
[im 7/42]
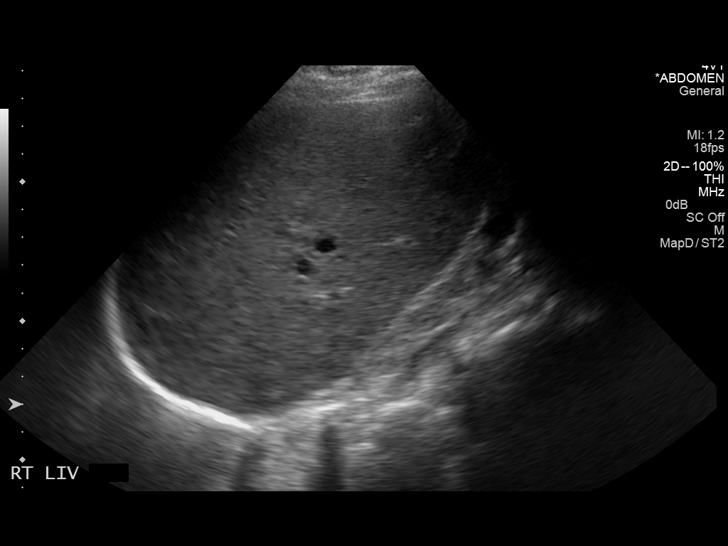
[im 11/42]
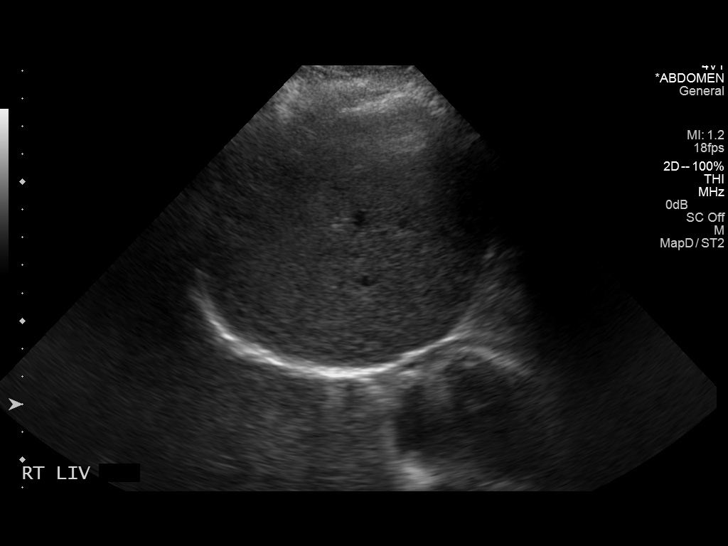
[im 14/42]
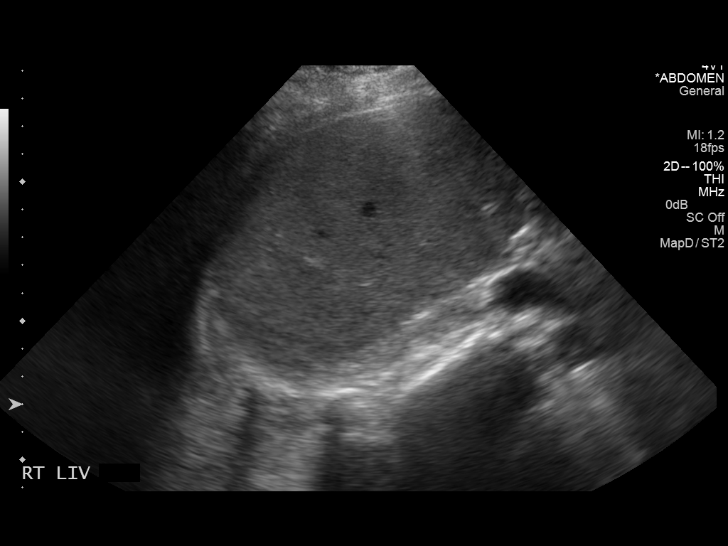
[im 16/42]
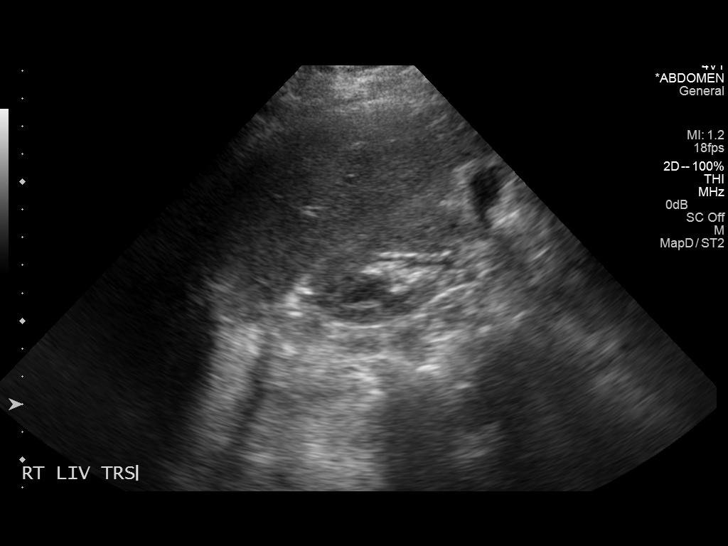
[im 19/42]
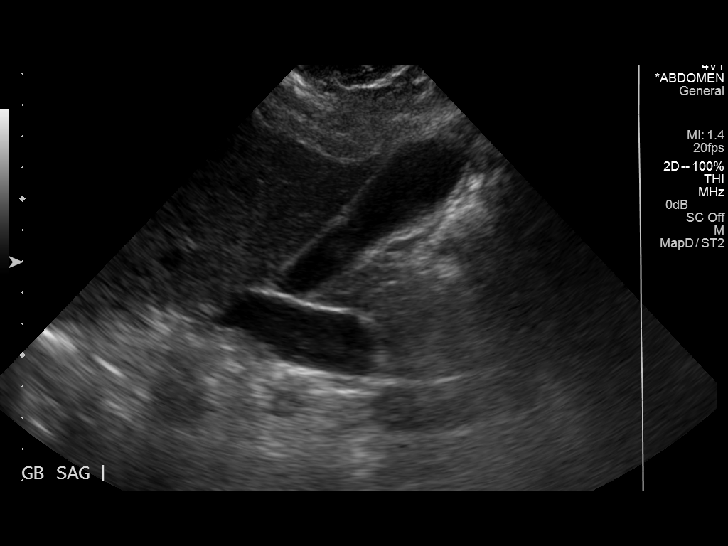
[im 23/42]
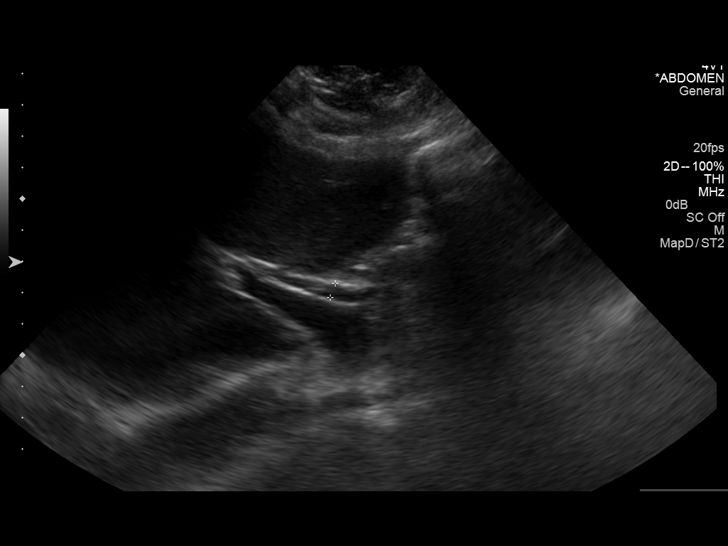
[im 26/42]
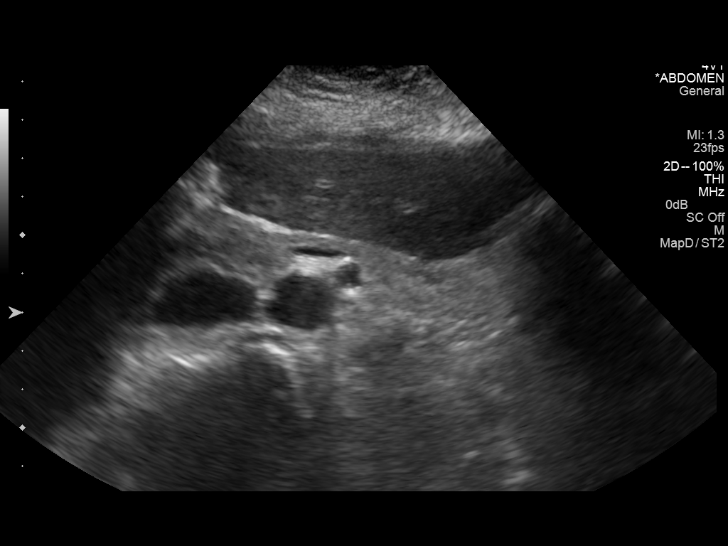
[im 28/42]
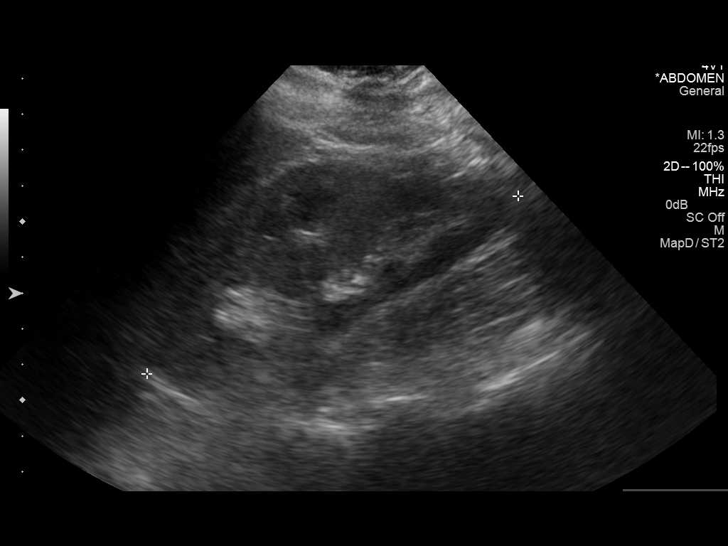
[im 31/42]
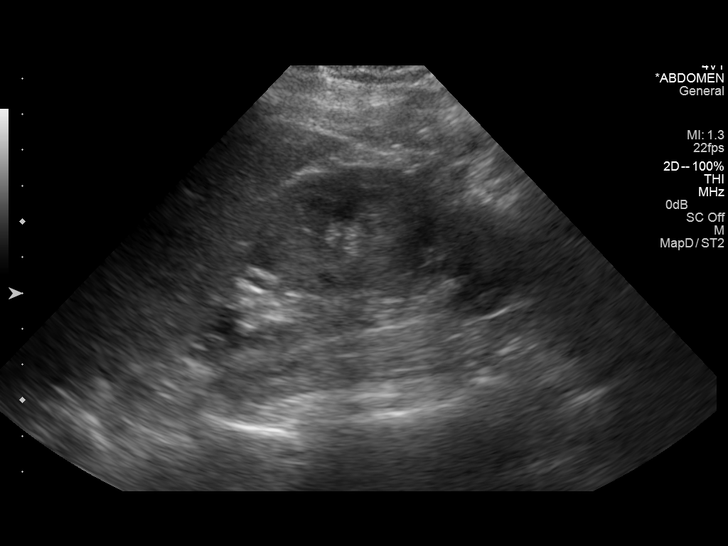
[im 35/42]
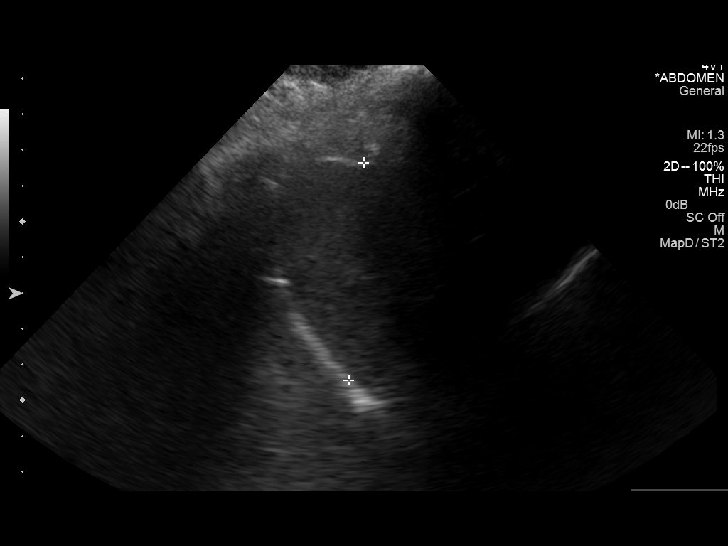
[im 38/42]
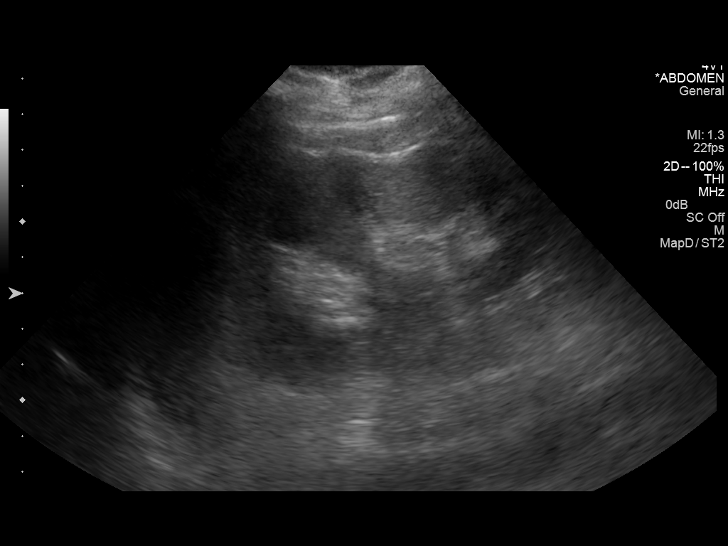
[im 42/42]
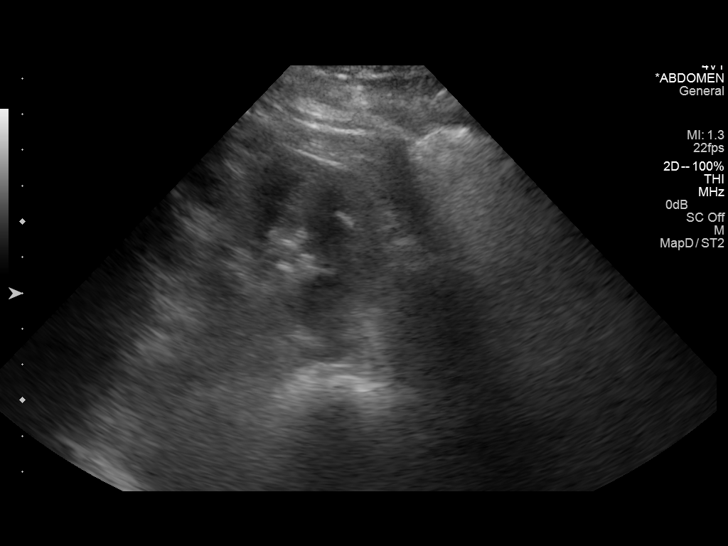

[14 of 25 positions shown; findings below may reference images not displayed]

FINDINGS: Gallbladder:

The gallbladder is unremarkable. There is no evidence of
cholelithiasis or acute cholecystitis.

Common bile duct:

Diameter: 4.8 mm. There is no evidence of intrahepatic or
extrahepatic biliary dilatation.

Liver:

No focal lesion identified. Within normal limits in parenchymal
echogenicity.

IVC:

No abnormality visualized.

Pancreas:

Visualized portion unremarkable.

Spleen:

Size and appearance within normal limits.

Right Kidney:

Length: 10.8 cm. Echogenicity is upper limits of normal. There is no
evidence of solid mass or hydronephrosis.

Left Kidney:

Length: 10.3 cm.. Echogenicity is upper limits of normal. There is
no evidence of solid mass or hydronephrosis.

Abdominal aorta:

No aneurysm visualized.

Other findings:

None.
IMPRESSION: No evidence of acute abnormality.

Upper limits of normal renal echogenicity. Although this may be
normal, medical renal disease could have this appearance-correlate
clinically.

## 2014-02-04 IMAGING — CR DG CHEST 2V
2 series · 2 of 2 positions shown · non-contrast
Comparison: Chest radiograph from [DATE]

CLINICAL DATA: Right-sided chest pain.

EXAM:
CHEST  2 VIEW

[w chest pa]
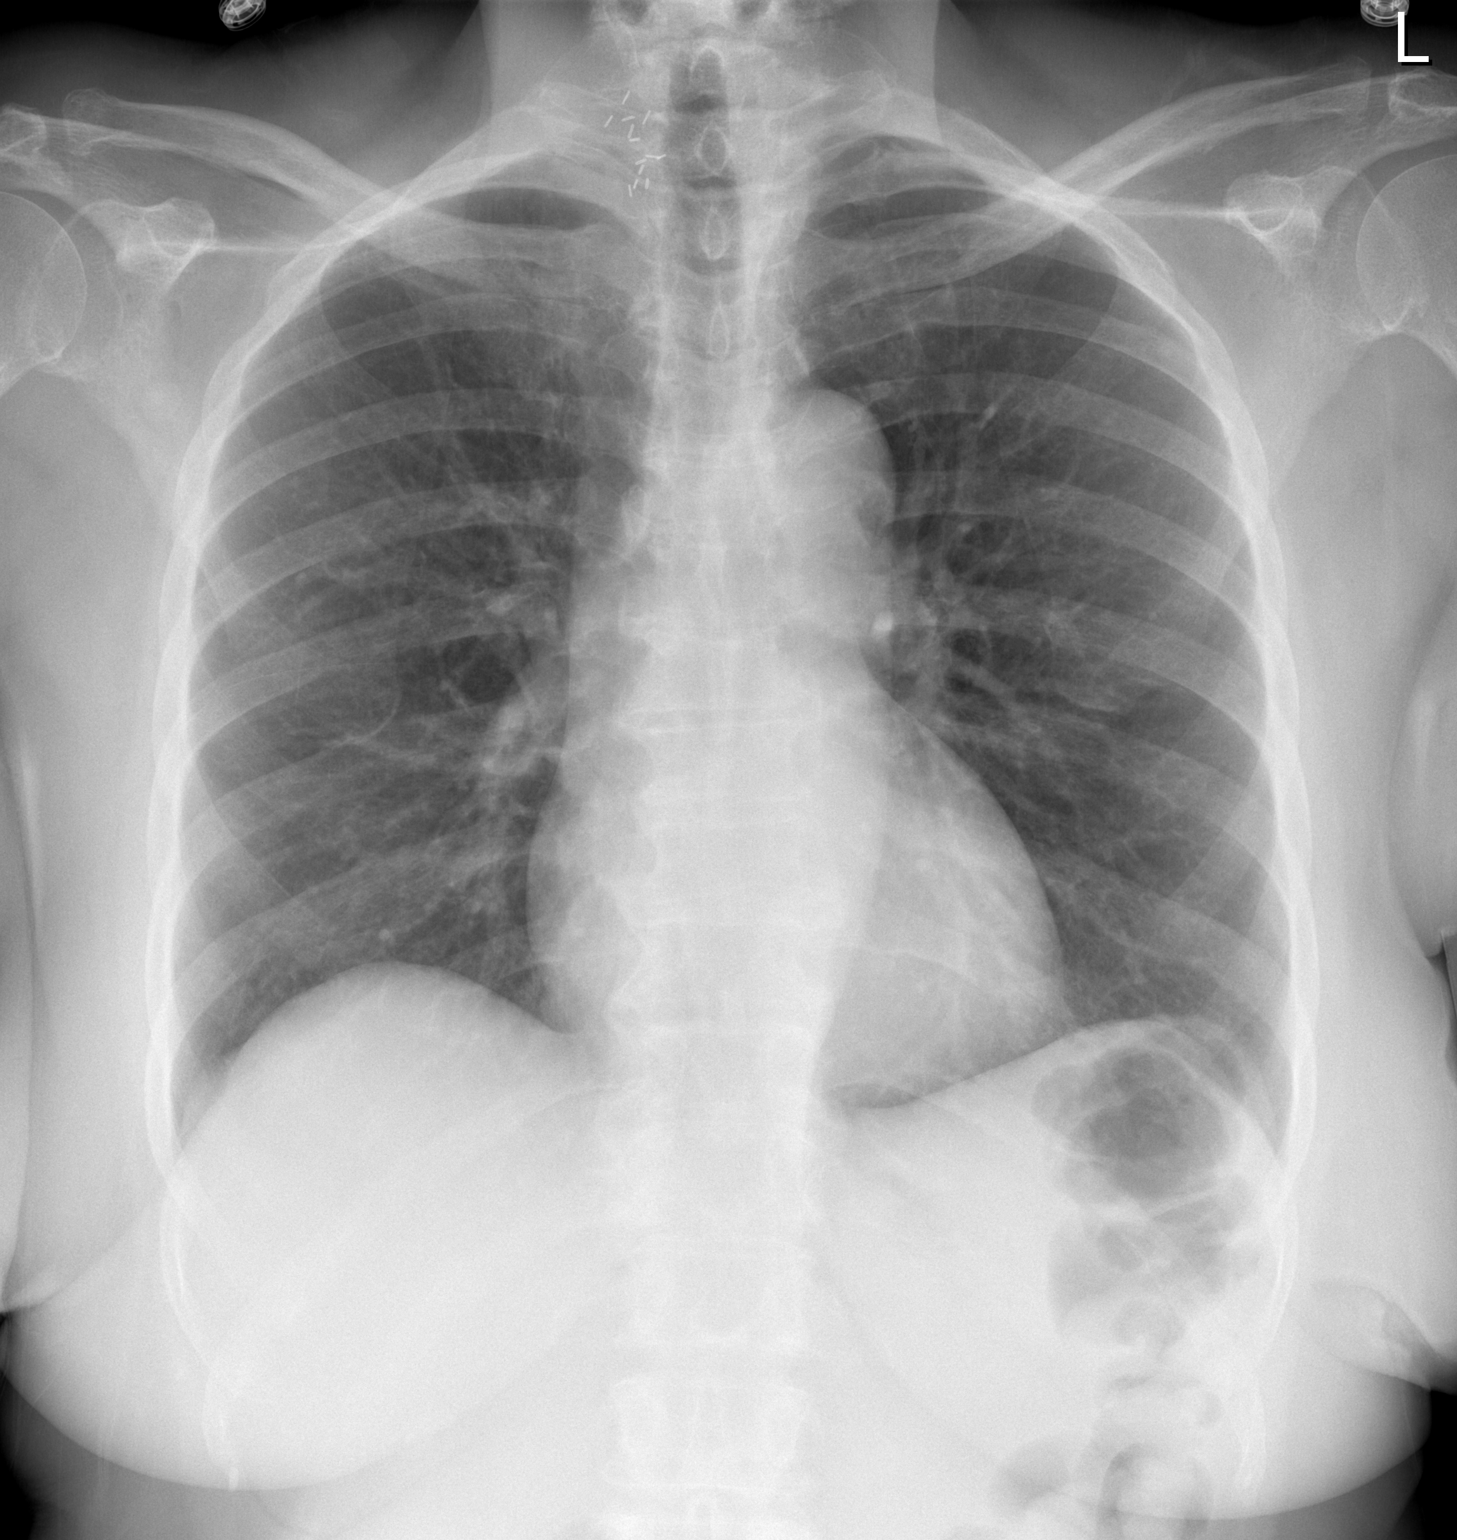

[w chest lat]
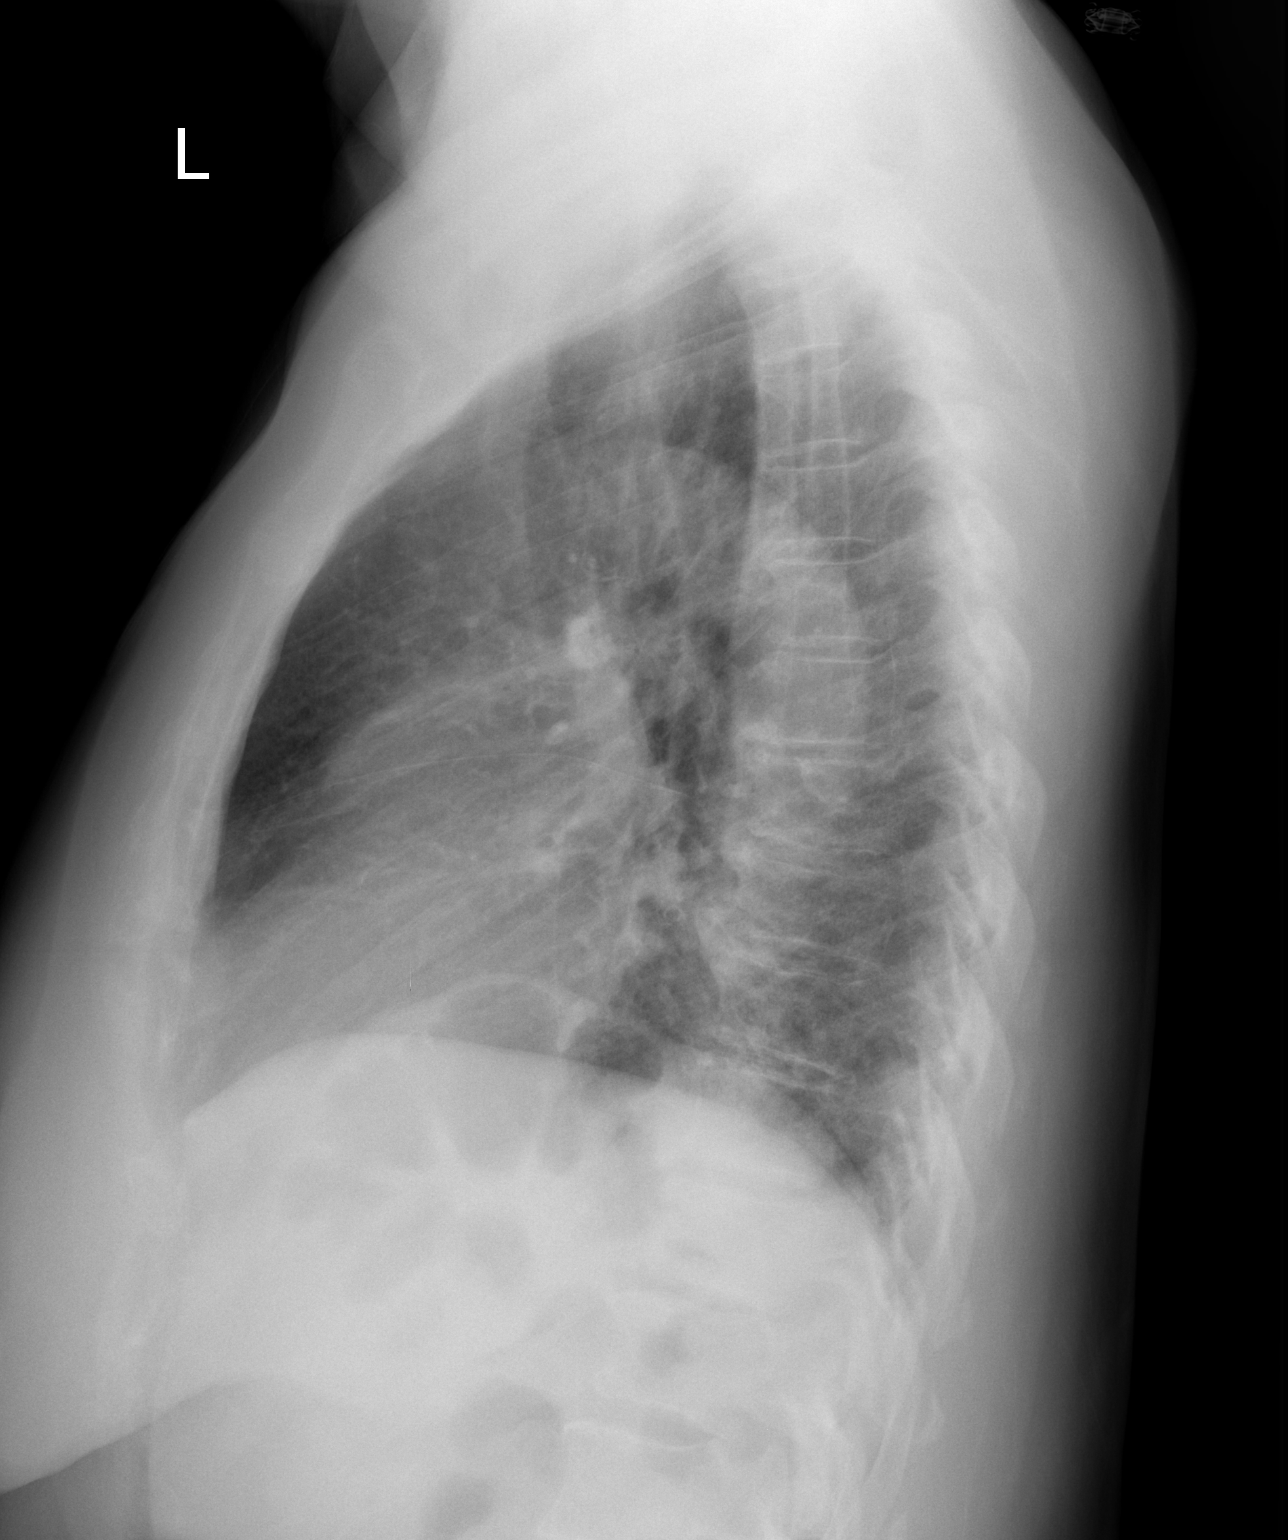

[2 of 2 positions shown; findings below may reference images not displayed]

FINDINGS: The lungs are well-aerated and clear. There is no evidence of focal
opacification, pleural effusion or pneumothorax.

The heart is normal in size; the mediastinal contour is within
normal limits. No acute osseous abnormalities are seen.
Postoperative change is noted overlying the right thyroid bed.
IMPRESSION: No acute cardiopulmonary process seen.

## 2014-02-04 MED ORDER — SODIUM CHLORIDE 0.9 % IV BOLUS (SEPSIS)
1000.0000 mL | Freq: Once | INTRAVENOUS | Status: AC
  Administered 2014-02-04: 1000 mL via INTRAVENOUS

## 2014-02-04 MED ORDER — FENTANYL CITRATE 0.05 MG/ML IJ SOLN
50.0000 ug | Freq: Once | INTRAMUSCULAR | Status: AC
  Administered 2014-02-04: 50 ug via INTRAVENOUS
  Filled 2014-02-04: qty 2

## 2014-02-04 MED ORDER — IPRATROPIUM-ALBUTEROL 0.5-2.5 (3) MG/3ML IN SOLN
3.0000 mL | Freq: Once | RESPIRATORY_TRACT | Status: AC
  Administered 2014-02-04: 3 mL via RESPIRATORY_TRACT
  Filled 2014-02-04: qty 3

## 2014-02-04 NOTE — Discharge Instructions (Signed)
Abdominal Pain, Women °Abdominal (stomach, pelvic, or belly) pain can be caused by many things. It is important to tell your doctor: °· The location of the pain. °· Does it come and go or is it present all the time? °· Are there things that start the pain (eating certain foods, exercise)? °· Are there other symptoms associated with the pain (fever, nausea, vomiting, diarrhea)? °All of this is helpful to know when trying to find the cause of the pain. °CAUSES  °· Stomach: virus or bacteria infection, or ulcer. °· Intestine: appendicitis (inflamed appendix), regional ileitis (Crohn's disease), ulcerative colitis (inflamed colon), irritable bowel syndrome, diverticulitis (inflamed diverticulum of the colon), or cancer of the stomach or intestine. °· Gallbladder disease or stones in the gallbladder. °· Kidney disease, kidney stones, or infection. °· Pancreas infection or cancer. °· Fibromyalgia (pain disorder). °· Diseases of the female organs: °¨ Uterus: fibroid (non-cancerous) tumors or infection. °¨ Fallopian tubes: infection or tubal pregnancy. °¨ Ovary: cysts or tumors. °¨ Pelvic adhesions (scar tissue). °¨ Endometriosis (uterus lining tissue growing in the pelvis and on the pelvic organs). °¨ Pelvic congestion syndrome (female organs filling up with blood just before the menstrual period). °¨ Pain with the menstrual period. °¨ Pain with ovulation (producing an egg). °¨ Pain with an IUD (intrauterine device, birth control) in the uterus. °¨ Cancer of the female organs. °· Functional pain (pain not caused by a disease, may improve without treatment). °· Psychological pain. °· Depression. °DIAGNOSIS  °Your doctor will decide the seriousness of your pain by doing an examination. °· Blood tests. °· X-rays. °· Ultrasound. °· CT scan (computed tomography, special type of X-ray). °· MRI (magnetic resonance imaging). °· Cultures, for infection. °· Barium enema (dye inserted in the large intestine, to better view it with  X-rays). °· Colonoscopy (looking in intestine with a lighted tube). °· Laparoscopy (minor surgery, looking in abdomen with a lighted tube). °· Major abdominal exploratory surgery (looking in abdomen with a large incision). °TREATMENT  °The treatment will depend on the cause of the pain.  °· Many cases can be observed and treated at home. °· Over-the-counter medicines recommended by your caregiver. °· Prescription medicine. °· Antibiotics, for infection. °· Birth control pills, for painful periods or for ovulation pain. °· Hormone treatment, for endometriosis. °· Nerve blocking injections. °· Physical therapy. °· Antidepressants. °· Counseling with a psychologist or psychiatrist. °· Minor or major surgery. °HOME CARE INSTRUCTIONS  °· Do not take laxatives, unless directed by your caregiver. °· Take over-the-counter pain medicine only if ordered by your caregiver. Do not take aspirin because it can cause an upset stomach or bleeding. °· Try a clear liquid diet (broth or water) as ordered by your caregiver. Slowly move to a bland diet, as tolerated, if the pain is related to the stomach or intestine. °· Have a thermometer and take your temperature several times a day, and record it. °· Bed rest and sleep, if it helps the pain. °· Avoid sexual intercourse, if it causes pain. °· Avoid stressful situations. °· Keep your follow-up appointments and tests, as your caregiver orders. °· If the pain does not go away with medicine or surgery, you may try: °¨ Acupuncture. °¨ Relaxation exercises (yoga, meditation). °¨ Group therapy. °¨ Counseling. °SEEK MEDICAL CARE IF:  °· You notice certain foods cause stomach pain. °· Your home care treatment is not helping your pain. °· You need stronger pain medicine. °· You want your IUD removed. °· You feel faint or   lightheaded.  You develop nausea and vomiting.  You develop a rash.  You are having side effects or an allergy to your medicine. SEEK IMMEDIATE MEDICAL CARE IF:   Your  pain does not go away or gets worse.  You have a fever.  Your pain is felt only in portions of the abdomen. The right side could possibly be appendicitis. The left lower portion of the abdomen could be colitis or diverticulitis.  You are passing blood in your stools (bright red or black tarry stools, with or without vomiting).  You have blood in your urine.  You develop chills, with or without a fever.  You pass out. MAKE SURE YOU:   Understand these instructions.  Will watch your condition.  Will get help right away if you are not doing well or get worse. Document Released: 03/04/2007 Document Revised: 09/21/2013 Document Reviewed: 03/24/2009 [facility] Patient Information 2015 [place], [place]. This information is not intended to replace advice given to you by your health care provider. Make sure you discuss any questions you have with your health care provider.  Upper Respiratory Infection, Adult An upper respiratory infection (URI) is also sometimes known as the common cold. The upper respiratory tract includes the nose, sinuses, throat, trachea, and bronchi. Bronchi are the airways leading to the lungs. Most people improve within 1 week, but symptoms can last up to 2 weeks. A residual cough may last even longer.  CAUSES Many different viruses can infect the tissues lining the upper respiratory tract. The tissues become irritated and inflamed and often become very moist. Mucus production is also common. A cold is contagious. You can easily spread the virus to others by oral contact. This includes kissing, sharing a glass, coughing, or sneezing. Touching your mouth or nose and then touching a surface, which is then touched by another person, can also spread the virus. SYMPTOMS  Symptoms typically develop 1 to 3 days after you come in contact with a cold virus. Symptoms vary from person to person. They may include:  Runny nose.  Sneezing.  Nasal congestion.  Sinus  irritation.  Sore throat.  Loss of voice (laryngitis).  Cough.  Fatigue.  Muscle aches.  Loss of appetite.  Headache.  Low-grade fever. DIAGNOSIS  You might diagnose your own cold based on familiar symptoms, since most people get a cold 2 to 3 times a year. Your caregiver can confirm this based on your exam. Most importantly, your caregiver can check that your symptoms are not due to another disease such as strep throat, sinusitis, pneumonia, asthma, or epiglottitis. Blood tests, throat tests, and X-rays are not necessary to diagnose a common cold, but they may sometimes be helpful in excluding other more serious diseases. Your caregiver will decide if any further tests are required. RISKS AND COMPLICATIONS  You may be at risk for a more severe case of the common cold if you smoke cigarettes, have chronic heart disease (such as heart failure) or lung disease (such as asthma), or if you have a weakened immune system. The very young and very old are also at risk for more serious infections. Bacterial sinusitis, middle ear infections, and bacterial pneumonia can complicate the common cold. The common cold can worsen asthma and chronic obstructive pulmonary disease (COPD). Sometimes, these complications can require emergency medical care and may be life-threatening. PREVENTION  The best way to protect against getting a cold is to practice good hygiene. Avoid oral or hand contact with people with cold symptoms. Wash your  hands often if contact occurs. There is no clear evidence that vitamin C, vitamin E, echinacea, or exercise reduces the chance of developing a cold. However, it is always recommended to get plenty of rest and practice good nutrition. TREATMENT  Treatment is directed at relieving symptoms. There is no cure. Antibiotics are not effective, because the infection is caused by a virus, not by bacteria. Treatment may include:  Increased fluid intake. Sports drinks offer valuable  electrolytes, sugars, and fluids.  Breathing heated mist or steam (vaporizer or shower).  Eating chicken soup or other clear broths, and maintaining good nutrition.  Getting plenty of rest.  Using gargles or lozenges for comfort.  Controlling fevers with ibuprofen or acetaminophen as directed by your caregiver.  Increasing usage of your inhaler if you have asthma. Zinc gel and zinc lozenges, taken in the first 24 hours of the common cold, can shorten the duration and lessen the severity of symptoms. Pain medicines may help with fever, muscle aches, and throat pain. A variety of non-prescription medicines are available to treat congestion and runny nose. Your caregiver can make recommendations and may suggest nasal or lung inhalers for other symptoms.  HOME CARE INSTRUCTIONS   Only take over-the-counter or prescription medicines for pain, discomfort, or fever as directed by your caregiver.  Use a warm mist humidifier or inhale steam from a shower to increase air moisture. This may keep secretions moist and make it easier to breathe.  Drink enough water and fluids to keep your urine clear or pale yellow.  Rest as needed.  Return to work when your temperature has returned to normal or as your caregiver advises. You may need to stay home longer to avoid infecting others. You can also use a face mask and careful hand washing to prevent spread of the virus. SEEK MEDICAL CARE IF:   After the first few days, you feel you are getting worse rather than better.  You need your caregiver's advice about medicines to control symptoms.  You develop chills, worsening shortness of breath, or brown or red sputum. These may be signs of pneumonia.  You develop yellow or brown nasal discharge or pain in the face, especially when you bend forward. These may be signs of sinusitis.  You develop a fever, swollen neck glands, pain with swallowing, or white areas in the back of your throat. These may be signs  of strep throat. SEEK IMMEDIATE MEDICAL CARE IF:   You have a fever.  You develop severe or persistent headache, ear pain, sinus pain, or chest pain.  You develop wheezing, a prolonged cough, cough up blood, or have a change in your usual mucus (if you have chronic lung disease).  You develop sore muscles or a stiff neck. Document Released: 10/31/2000 Document Revised: 07/30/2011 Document Reviewed: 08/12/2013 [facility] Patient Information 2015 [place], [place]. This information is not intended to replace advice given to you by your health care provider. Make sure you discuss any questions you have with your health care provider.

## 2014-02-04 NOTE — ED Provider Notes (Signed)
TIME SEEN: 6:30 PM  CHIEF COMPLAINT: Nasal congestion, dry cough, shortness of breath, abdominal pain  HPI: Patient is a 73 year old female with history of asthma, hypertension, GERD who presents emergency department with multiple complaints. She states over the past several days she has had yellow nasal drainage that is draining down her throat, cough with some white sputum production, wheezing and an episode of shortness of breath in the waiting room. She denies any fevers or chills. No chest pain or chest discomfort. She has felt lightheaded intermittently for the past several days. She is also complaining of right upper quadrant abdominal pain that she describes as a "pain". Denies any aggravating or relieving factors. She has had this pain for 3 years but states his been worse over the past 6 months and worse over the past several days. She does not know why she has this chronic abdominal pain. She states she is followed by Dr. Jason Coop.  She states she's had a normal colonoscopy. She states she is status post hysterectomy and BTL. Denies any nausea, vomiting, diarrhea. No bloody stools or melena. No dysuria or hematuria.   ROS: See HPI Constitutional: no fever  Eyes: no drainage  ENT: no runny nose   Cardiovascular:  no chest pain  Resp:  SOB  GI: no vomiting GU: no dysuria Integumentary: no rash  Allergy: no hives  Musculoskeletal: no leg swelling  Neurological: no slurred speech ROS otherwise negative  PAST MEDICAL HISTORY/PAST SURGICAL HISTORY:  Past Medical History  Diagnosis Date  . Hypertension   . Asthma   . Environmental allergies   . GERD (gastroesophageal reflux disease)     MEDICATIONS:  Prior to Admission medications   Medication Sig Start Date End Date Taking? Authorizing Provider  acetaminophen (TYLENOL) 500 MG tablet Take 500 mg by mouth daily as needed. For pain    Historical Provider, MD  albuterol (PROVENTIL) (2.5 MG/3ML) 0.083% nebulizer solution Take 2.5 mg  by nebulization 2 (two) times daily as needed. For shortness of breath or wheezing    Historical Provider, MD  Azelastine HCl (ASTEPRO) 0.15 % SOLN Place 1 spray into the nose daily.     Historical Provider, MD  benzonatate (TESSALON) 100 MG capsule Take 1 capsule (100 mg total) by mouth every 8 (eight) hours. 05/03/13   Saddie Benders. Ghim, MD  budesonide-formoterol (SYMBICORT) 160-4.5 MCG/ACT inhaler Inhale 2 puffs into the lungs daily.    Historical Provider, MD  dextromethorphan-guaiFENesin (MUCINEX DM) 30-600 MG per 12 hr tablet Take 1 tablet by mouth 2 (two) times daily.    Historical Provider, MD  loratadine (CLARITIN) 10 MG tablet Take 10 mg by mouth daily as needed. For allergy symptoms    Historical Provider, MD  losartan-hydrochlorothiazide (HYZAAR) 100-12.5 MG per tablet Take 1 tablet by mouth daily.    Historical Provider, MD  omeprazole (PRILOSEC) 20 MG capsule Take 20 mg by mouth 2 (two) times daily.    Historical Provider, MD  predniSONE (DELTASONE) 20 MG tablet Take 2 tablets (40 mg total) by mouth daily. 05/03/13   Saddie Benders. Ghim, MD    ALLERGIES:  Allergies  Allergen Reactions  . Asa Buff (Mag [Buffered Aspirin] Other (See Comments)    Excessive sweating  . Fish Allergy Other (See Comments)    Unknown reaction  . Peanut-Containing Drug Products Other (See Comments)    From allergy test  . Penicillins Itching  . Shellfish-Derived Products Other (See Comments)    From allergy test  . Strawberry Other (  See Comments)    Worsened allergy symptoms    SOCIAL HISTORY:  History  Substance Use Topics  . Smoking status: Former Research scientist (life sciences)  . Smokeless tobacco: Not on file  . Alcohol Use: No    FAMILY HISTORY: No family history on file.  EXAM: BP 132/81  Pulse 69  Temp(Src) 97.8 F (36.6 C) (Oral)  Resp 18  Ht 5\' 5"  (1.651 m)  Wt 181 lb (82.101 kg)  BMI 30.12 kg/m2  SpO2 99% CONSTITUTIONAL: Alert and oriented and responds appropriately to questions. Well-appearing;  well-nourished HEAD: Normocephalic EYES: Conjunctivae clear, PERRL ENT: normal nose; no rhinorrhea; dry mucous membranes; pharynx without lesions noted, status post tonsillectomy, cleared nasal drainage noted in the posterior oropharynx, no stridor NECK: Supple, no meningismus, no LAD  CARD: RRR; S1 and S2 appreciated; no murmurs, no clicks, no rubs, no gallops RESP: Normal chest excursion without splinting or tachypnea; breath sounds equal bilaterally the patient does have some diffuse expiratory wheezing, no rhonchi or rales, no hypoxia, no respiratory distress, speaking full sentences ABD/GI: Normal bowel sounds; non-distended; soft, tender to palpation in her upper quadrant and epigastric region without guarding or rebound, no peritoneal signs, negative Murphy sign, nontender at McBurney's point BACK:  The back appears normal and is non-tender to palpation, there is no CVA tenderness EXT: Normal ROM in all joints; non-tender to palpation; no edema; normal capillary refill; no cyanosis    SKIN: Normal color for age and race; warm NEURO: Moves all extremities equally, sensation to light touch intact diffusely, cranial nerves II through XII intact PSYCH: The patient's mood and manner are appropriate. Grooming and personal hygiene are appropriate.  MEDICAL DECISION MAKING: Patient here with multiple complaints. Suspect that she has a viral URI. She does have some cough and wheezing. She did have an episode of shortness of breath in the waiting room. Will obtain EKG, troponin, chest x-ray and give duo neb. She is also complaining of intermittent lightheadedness. She does appear slightly dry on exam. Will give IV fluids. She is also complaining of chronic right upper abdominal pain and epigastric pain that is worse over the past several days without any associated symptoms. Will obtain abdominal labs and urine. Will obtain a right upper quadrant ultrasound. We'll give pain medication. Differential  diagnosis includes cholelithiasis, cholecystitis, pancreatitis, UTI, gastritis, GERD.  ED PROGRESS: Workup in the ED has been unremarkable. Patient reports she is feeling much better. Her lungs are now clear to auscultation. Suspect viral URI. I do not think she denies. Chest x-ray shows no infiltrate.   She will followup with her gastroenterologist for her chronic abdominal pain. Ultrasound today is negative. Labs unremarkable. Urine shows no sign of infection. She states she will take her medications at home as prescribed does not need pain medication. She has tolerated by mouth without difficulty. Discussed strict return precautions and supportive care instructions. She verbalized understanding and is comfortable with plan.      EKG Interpretation  Date/Time:  Thursday February 04 2014 19:24:17 EDT Ventricular Rate:  77 PR Interval:  176 QRS Duration: 91 QT Interval:  427 QTC Calculation: 483 R Axis:   7 Text Interpretation:  Sinus rhythm Abnormal R-wave progression, early transition Confirmed by Lydie Stammen,  DO, Samin Milke [phone]) on 02/04/2014 7:28:05 PM        [place], DO 02/04/14 2256

## 2014-02-04 NOTE — ED Notes (Signed)
Patient transported to Ultrasound 

## 2014-02-04 NOTE — ED Notes (Addendum)
Pt c/o chronic right lateral abd pain, denies n/v/d, reports having some constipation, sts that it normally goes away on his own but this time it hasn't. LBM today. Describes pain as burning sensation that radiates into throat. Reports hx of GERD.Pt also c/o nasal drainage and allergies. Usually gets better when her medications but no change this time. Made appointment with PCP but couldn't get in until later. Nad, skin warm and dry, resp e/u.

## 2014-02-04 NOTE — ED Notes (Signed)
Pt crying upon entry to room. When asked why patient is crying she states "I have really bad allergies and I can feel it draining into the right side of my throat. I also have chronic right side pain right here (points to right side of abdomen.)" States she saw her PCP for allergies but is not having relief.

## 2014-02-17 ENCOUNTER — Emergency Department (HOSPITAL_COMMUNITY): Payer: Medicare HMO

## 2014-02-17 ENCOUNTER — Encounter (HOSPITAL_COMMUNITY): Payer: Self-pay | Admitting: Emergency Medicine

## 2014-02-17 ENCOUNTER — Emergency Department (HOSPITAL_COMMUNITY)
Admission: EM | Admit: 2014-02-17 | Discharge: 2014-02-18 | Disposition: A | Discharge status: Home / Self Care | Payer: Medicare HMO | Attending: Emergency Medicine | Admitting: Emergency Medicine

## 2014-02-17 DIAGNOSIS — Z79899 Other long term (current) drug therapy: Secondary | ICD-10-CM | POA: Insufficient documentation

## 2014-02-17 DIAGNOSIS — I10 Essential (primary) hypertension: Secondary | ICD-10-CM | POA: Insufficient documentation

## 2014-02-17 DIAGNOSIS — J45909 Unspecified asthma, uncomplicated: Secondary | ICD-10-CM | POA: Insufficient documentation

## 2014-02-17 DIAGNOSIS — Z7952 Long term (current) use of systemic steroids: Secondary | ICD-10-CM | POA: Insufficient documentation

## 2014-02-17 DIAGNOSIS — Z880 Allergy status to penicillin: Secondary | ICD-10-CM | POA: Diagnosis not present

## 2014-02-17 DIAGNOSIS — K219 Gastro-esophageal reflux disease without esophagitis: Secondary | ICD-10-CM | POA: Diagnosis not present

## 2014-02-17 DIAGNOSIS — R141 Gas pain: Secondary | ICD-10-CM | POA: Diagnosis not present

## 2014-02-17 DIAGNOSIS — R1010 Upper abdominal pain, unspecified: Secondary | ICD-10-CM | POA: Insufficient documentation

## 2014-02-17 DIAGNOSIS — R109 Unspecified abdominal pain: Secondary | ICD-10-CM

## 2014-02-17 DIAGNOSIS — IMO0002 Reserved for concepts with insufficient information to code with codable children: Secondary | ICD-10-CM | POA: Diagnosis not present

## 2014-02-17 LAB — URINE MICROSCOPIC-ADD ON

## 2014-02-17 LAB — URINALYSIS, ROUTINE W REFLEX MICROSCOPIC
Bilirubin Urine: NEGATIVE
Glucose, UA: NEGATIVE mg/dL
Hgb urine dipstick: NEGATIVE
Ketones, ur: NEGATIVE mg/dL
Nitrite: NEGATIVE
Protein, ur: NEGATIVE mg/dL
Specific Gravity, Urine: 1.008 (ref 1.005–1.030)
Urobilinogen, UA: 1 mg/dL (ref 0.0–1.0)
pH: 6.5 (ref 5.0–8.0)

## 2014-02-17 LAB — CBC WITH DIFFERENTIAL/PLATELET
Basophils Absolute: 0.1 10*3/uL (ref 0.0–0.1)
Basophils Relative: 1 % (ref 0–1)
Eosinophils Absolute: 0.4 10*3/uL (ref 0.0–0.7)
Eosinophils Relative: 4 % (ref 0–5)
HCT: 39 % (ref 36.0–46.0)
Hemoglobin: 13.1 g/dL (ref 12.0–15.0)
Lymphocytes Relative: 40 % (ref 12–46)
Lymphs Abs: 3.8 10*3/uL (ref 0.7–4.0)
MCH: 29.5 pg (ref 26.0–34.0)
MCHC: 33.6 g/dL (ref 30.0–36.0)
MCV: 87.8 fL (ref 78.0–100.0)
Monocytes Absolute: 0.8 10*3/uL (ref 0.1–1.0)
Monocytes Relative: 9 % (ref 3–12)
Neutro Abs: 4.5 10*3/uL (ref 1.7–7.7)
Neutrophils Relative %: 46 % (ref 43–77)
Platelets: 284 10*3/uL (ref 150–400)
RBC: 4.44 MIL/uL (ref 3.87–5.11)
RDW: 13.3 % (ref 11.5–15.5)
WBC: 9.5 10*3/uL (ref 4.0–10.5)

## 2014-02-17 LAB — COMPREHENSIVE METABOLIC PANEL
ALT: 8 U/L (ref 0–35)
AST: 17 U/L (ref 0–37)
Albumin: 3.5 g/dL (ref 3.5–5.2)
Alkaline Phosphatase: 87 U/L (ref 39–117)
Anion gap: 14 (ref 5–15)
BUN: 17 mg/dL (ref 6–23)
CO2: 23 mEq/L (ref 19–32)
Calcium: 9.1 mg/dL (ref 8.4–10.5)
Chloride: 100 mEq/L (ref 96–112)
Creatinine, Ser: 0.85 mg/dL (ref 0.50–1.10)
GFR calc Af Amer: 76 mL/min — ABNORMAL LOW (ref >90)
GFR calc non Af Amer: 66 mL/min — ABNORMAL LOW (ref >90)
Glucose, Bld: 123 mg/dL — ABNORMAL HIGH (ref 70–99)
Potassium: 3.4 mEq/L — ABNORMAL LOW (ref 3.7–5.3)
Sodium: 137 mEq/L (ref 137–147)
Total Bilirubin: 0.3 mg/dL (ref 0.3–1.2)
Total Protein: 7.5 g/dL (ref 6.0–8.3)

## 2014-02-17 LAB — LIPASE, BLOOD: Lipase: 25 U/L (ref 11–59)

## 2014-02-17 IMAGING — CT CT ABD-PELV W/O CM
1 series · 15 of 30 positions shown, 19 images · non-contrast
Comparison: CT abdomen and pelvis [DATE]

CLINICAL DATA: Increased pain in the right abdomen and flank.
Previous negative ultrasound of gallbladder.

EXAM:
CT ABDOMEN AND PELVIS WITHOUT CONTRAST
TECHNIQUE: Multidetector CT imaging of the abdomen and pelvis was performed
following the standard protocol without IV contrast.

[Series 6: lung · axial · 0.74mm/px · z∈[+1272,+1402]mm · 15 of 30 slices shown, 19 images]
[im 3/30  soft-tissue]
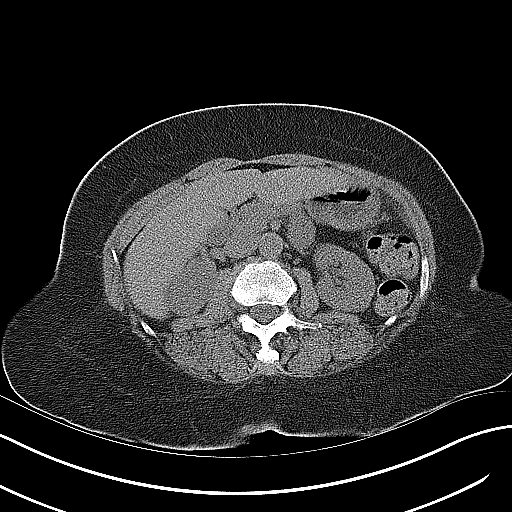
[im 3/30  bone]
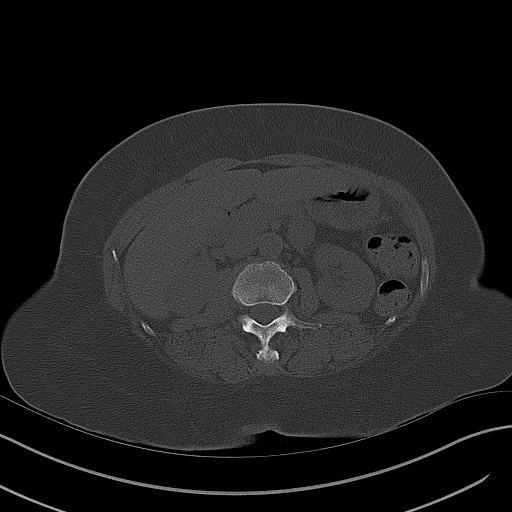
[im 5/30  soft-tissue]
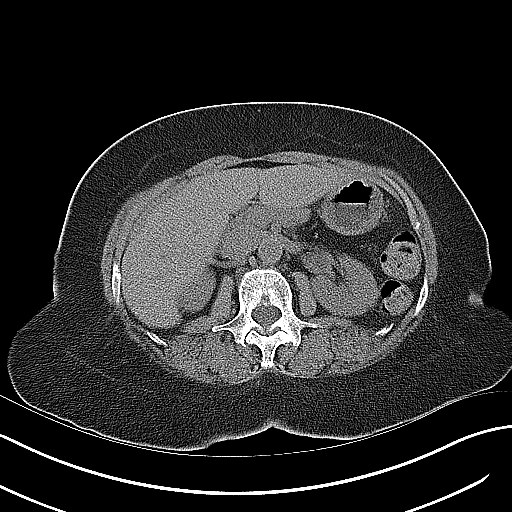
[im 7/30  soft-tissue]
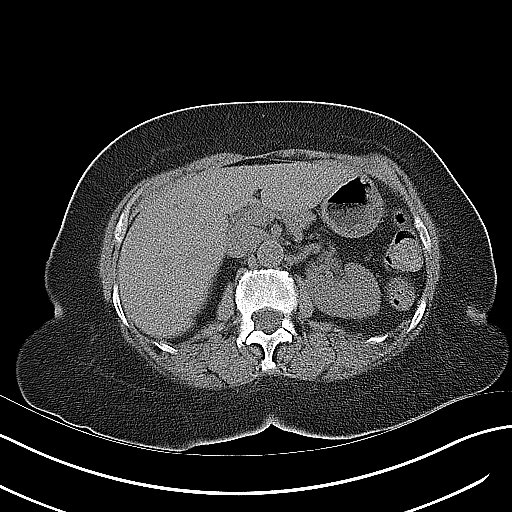
[im 9/30  soft-tissue]
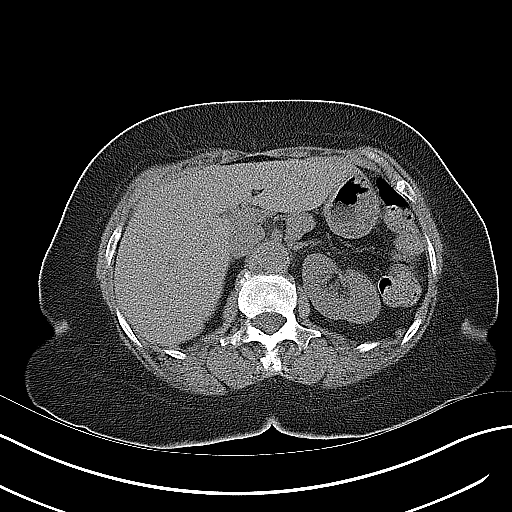
[im 11/30  soft-tissue]
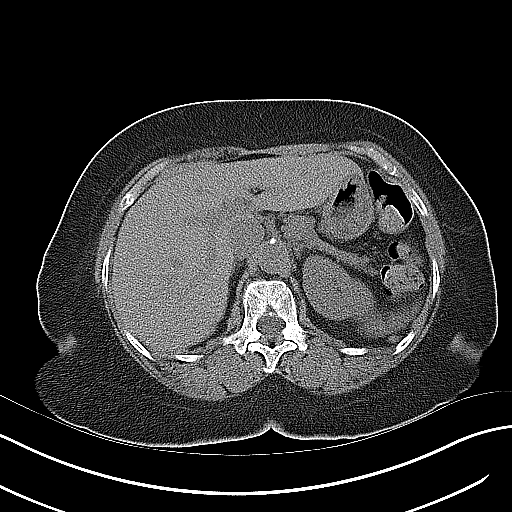
[im 13/30  soft-tissue]
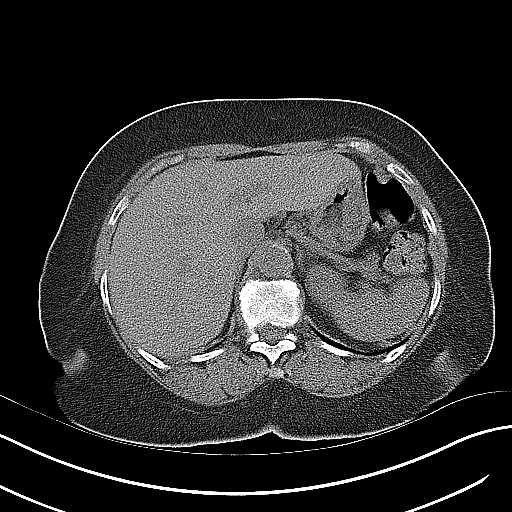
[im 16/30  soft-tissue]
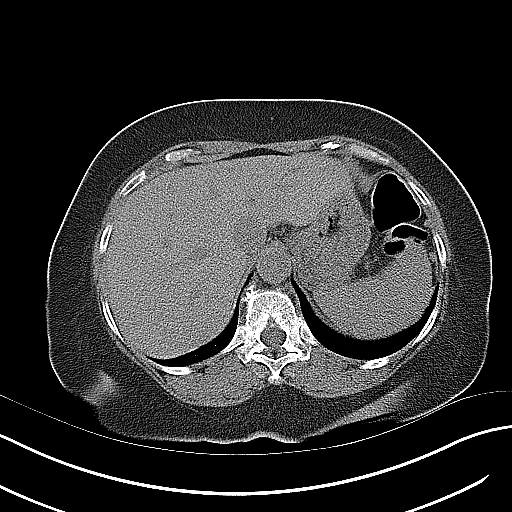
[im 18/30  soft-tissue]
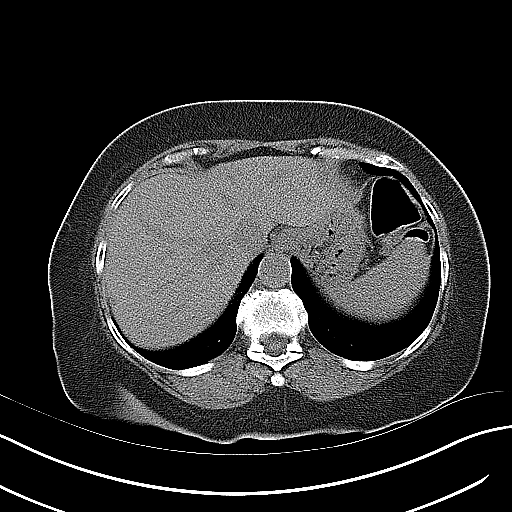
[im 20/30  soft-tissue]
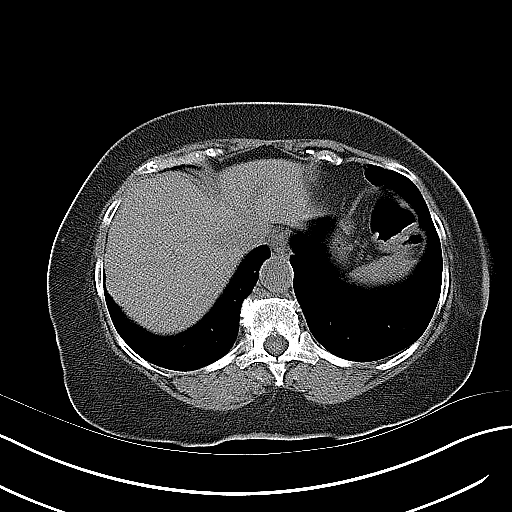
[im 20/30  bone]
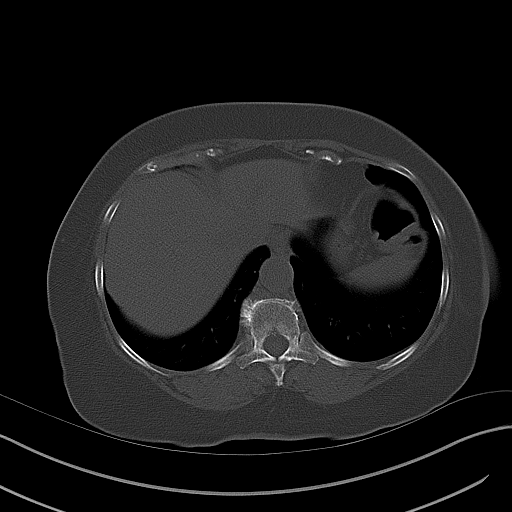
[im 22/30  soft-tissue]
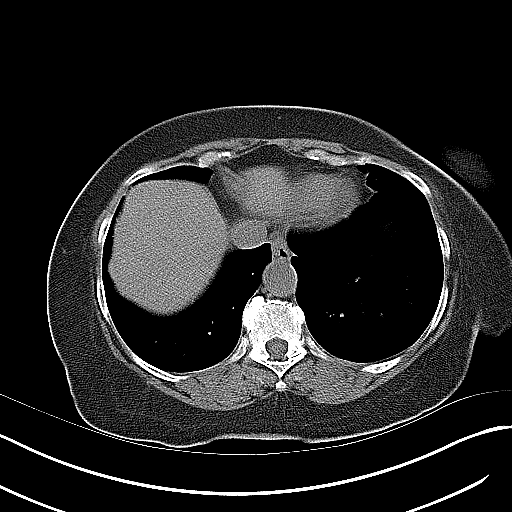
[im 24/30  soft-tissue]
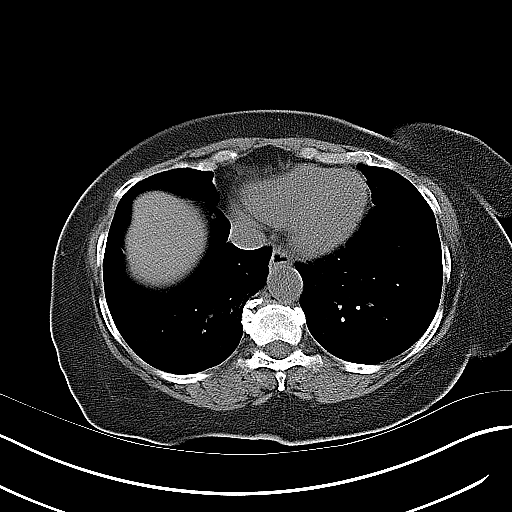
[im 26/30  soft-tissue]
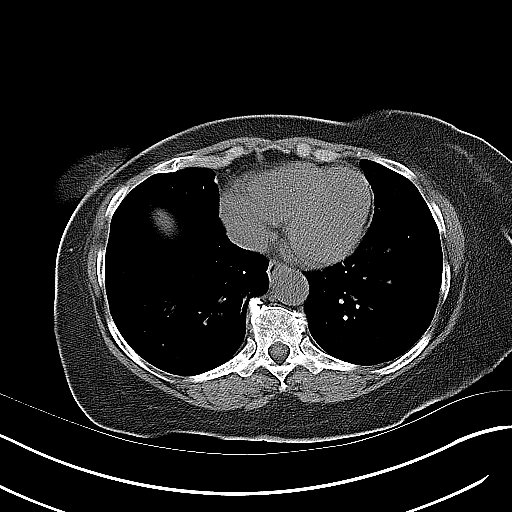
[im 26/30  lung]
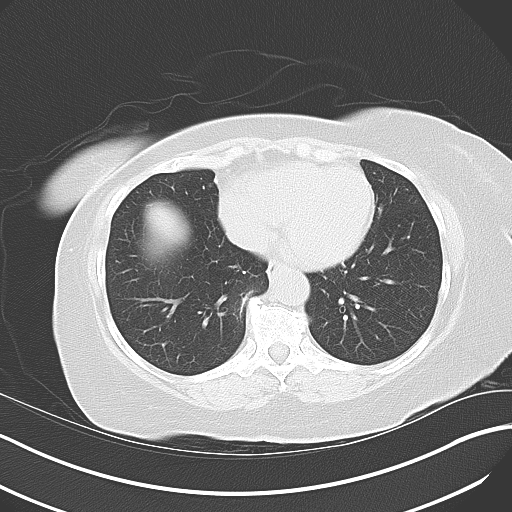
[im 27/30  lung]
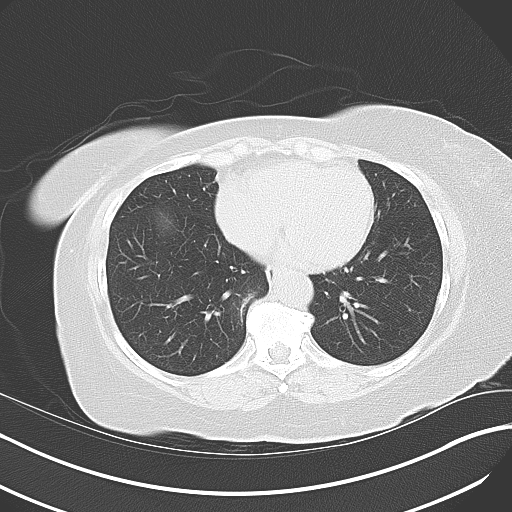
[im 28/30  soft-tissue]
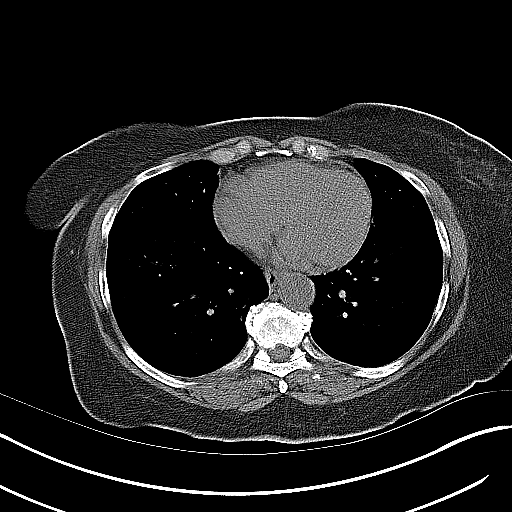
[im 28/30  lung]
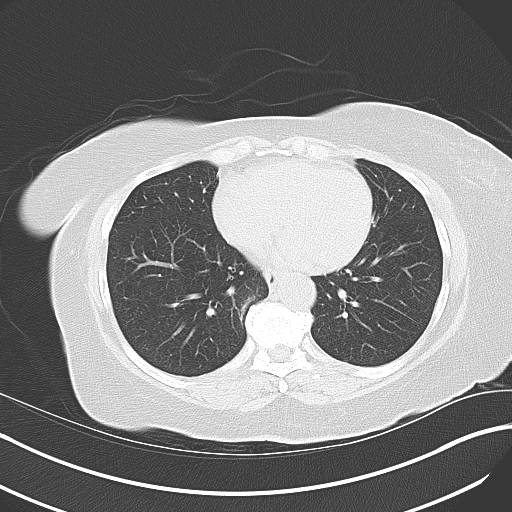
[im 29/30  lung]
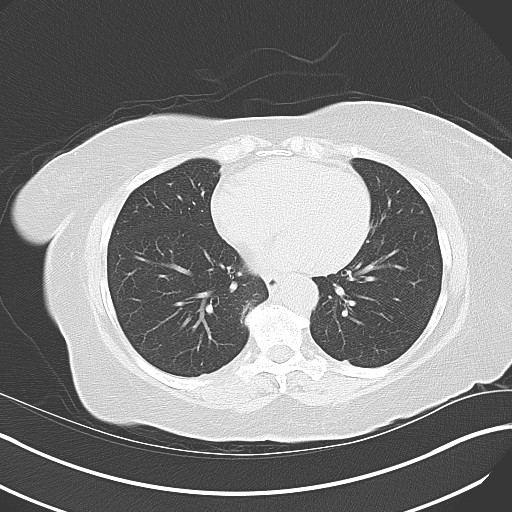

[15 of 30 positions shown; findings below may reference images not displayed]

FINDINGS: Slight fibrosis in the right lung base.

The kidneys appear symmetrical in size and shape. No hydronephrosis
or hydroureter. No renal, ureteral, or bladder stones. Bladder wall
is not thickened.

Focal calcification in the gallbladder appears to be in wall rather
than a stone. No inflammatory changes involving the gallbladder. The
unenhanced appearance of the liver, spleen, pancreas, adrenal
glands, abdominal aorta, and retroperitoneal lymph nodes is
unremarkable. Stomach and small bowel are decompressed. Stool-filled
colon without distention. No free air or free fluid in the abdomen.
Minimal umbilical hernia containing fat.

Pelvis: Surgical absence of the uterus. No pelvic mass or
lymphadenopathy. An appendiceal stump is visualized and appears
normal. Remainder the appendix is not definitively identified. This
may be obscured or surgically absent. No free or loculated pelvic
fluid collections. No evidence of diverticulitis. Calcifications in
the subcutaneous fat of the gluteal regions likely representing
injection granulomas. The degenerative changes in the lumbar spine.
Mild anterior subluxation of L4 on L5 probably degenerative. No
destructive bone lesions appreciated.
IMPRESSION: No renal or ureteral stone or obstruction.

## 2014-02-17 MED ORDER — ONDANSETRON HCL 4 MG/2ML IJ SOLN
4.0000 mg | Freq: Once | INTRAMUSCULAR | Status: AC
  Administered 2014-02-17: 4 mg via INTRAVENOUS
  Filled 2014-02-17: qty 2

## 2014-02-17 MED ORDER — SULFAMETHOXAZOLE-TRIMETHOPRIM 800-160 MG PO TABS: 1.0000 | ORAL_TABLET | Freq: Two times a day (BID) | ORAL | Status: DC

## 2014-02-17 MED ORDER — SODIUM CHLORIDE 0.9 % IV SOLN
1000.0000 mL | INTRAVENOUS | Status: DC
  Administered 2014-02-17: 1000 mL via INTRAVENOUS

## 2014-02-17 MED ORDER — SODIUM CHLORIDE 0.9 % IV SOLN
1000.0000 mL | Freq: Once | INTRAVENOUS | Status: AC
Start: 2014-02-17 — End: 2014-02-18
  Administered 2014-02-17: 1000 mL via INTRAVENOUS

## 2014-02-17 MED ORDER — TRAMADOL HCL 50 MG PO TABS: 50.0000 mg | ORAL_TABLET | Freq: Four times a day (QID) | ORAL | Status: DC | PRN

## 2014-02-17 MED ORDER — MORPHINE SULFATE 4 MG/ML IJ SOLN
4.0000 mg | Freq: Once | INTRAMUSCULAR | Status: AC
  Administered 2014-02-17: 4 mg via INTRAVENOUS
  Filled 2014-02-17: qty 1

## 2014-02-17 NOTE — ED Provider Notes (Signed)
CSN: [id number]     Arrival date & time 02/17/14  2117 History   First MD Initiated Contact with Patient 02/17/14 2132     Chief Complaint  Patient presents with  . Flank Pain    Patient is a 73 y.o. female presenting with flank pain. The history is provided by the patient.  Flank Pain This is a new problem. The current episode started more than 1 week ago. The problem occurs constantly. The problem has not changed since onset.Associated symptoms include abdominal pain. Pertinent negatives include no chest pain, no headaches and no shortness of breath. The symptoms are aggravated by walking (and laying down.  It feels like it is burning.  She also has a lot of gas). Nothing relieves the symptoms. She has tried acetaminophen for the symptoms. The treatment provided significant relief.  No vomiting.   No diarrhea.  No cough.  She was seen in the ED a couple of weeks ago for uri symptoms but also had pain in the right side then too.  She had an [place] that was normal.  Past Medical History  Diagnosis Date  . Hypertension   . Asthma   . Environmental allergies   . GERD (gastroesophageal reflux disease)    Past Surgical History  Procedure Laterality Date  . Knee surgery    . Nasal sinus surgery    . Thyroid surgery     History reviewed. No pertinent family history. History  Substance Use Topics  . Smoking status: Former Research scientist (life sciences)  . Smokeless tobacco: Not on file  . Alcohol Use: No   OB History   Grav Para Term Preterm Abortions TAB SAB Ect Mult Living                 Review of Systems  Respiratory: Negative for shortness of breath.   Cardiovascular: Negative for chest pain.  Gastrointestinal: Positive for abdominal pain.  Genitourinary: Positive for flank pain.  Neurological: Negative for headaches.      Allergies  Asa buff (mag; Fish allergy; Other; Peanut-containing drug products; Penicillins; Shellfish-derived products; and Strawberry  Home Medications   Prior to Admission  medications   Medication Sig Start Date End Date Taking? Authorizing Provider  acetaminophen (TYLENOL) 500 MG tablet Take 500 mg by mouth daily as needed for mild pain, moderate pain, fever or headache. For pain   Yes Historical Provider, MD  albuterol (PROVENTIL) (2.5 MG/3ML) 0.083% nebulizer solution Take 2.5 mg by nebulization 2 (two) times daily as needed for wheezing or shortness of breath.    Yes Historical Provider, MD  Azelastine HCl (ASTEPRO) 0.15 % SOLN Place 2 sprays into the nose daily.    Yes Historical Provider, MD  budesonide-formoterol (SYMBICORT) 160-4.5 MCG/ACT inhaler Inhale 2 puffs into the lungs daily.   Yes Historical Provider, MD  loratadine (CLARITIN) 10 MG tablet Take 10 mg by mouth daily as needed. For allergy symptoms   Yes Historical Provider, MD  losartan-hydrochlorothiazide (HYZAAR) 100-12.5 MG per tablet Take 1 tablet by mouth daily.   Yes Historical Provider, MD  metoCLOPramide (REGLAN) 5 MG tablet Take 10 mg by mouth at bedtime.   Yes Historical Provider, MD  omeprazole (PRILOSEC) 40 MG capsule Take 40 mg by mouth daily.   Yes Historical Provider, MD  sulfamethoxazole-trimethoprim (SEPTRA DS) 800-160 MG per tablet Take 1 tablet by mouth every 12 (twelve) hours. 02/17/14   Dorie Rank, MD  traMADol (ULTRAM) 50 MG tablet Take 1 tablet (50 mg total) by mouth every 6 (  six) hours as needed. 02/17/14   Dorie Rank, MD   BP 144/65  Pulse 77  Temp(Src) 98.2 F (36.8 C) (Oral)  Resp 16  SpO2 100% Physical Exam  Nursing note and vitals reviewed. Constitutional: She appears well-developed and well-nourished. No distress.  HENT:  Head: Normocephalic and atraumatic.  Right Ear: External ear normal.  Left Ear: External ear normal.  Eyes: Conjunctivae are normal. Right eye exhibits no discharge. Left eye exhibits no discharge. No scleral icterus.  Neck: Neck supple. No tracheal deviation present.  Cardiovascular: Normal rate, regular rhythm and intact distal pulses.    Pulmonary/Chest: Effort normal and breath sounds normal. No stridor. No respiratory distress. She has no wheezes. She has no rales.  Abdominal: Soft. Bowel sounds are normal. She exhibits no distension. There is no tenderness. There is no rebound and no guarding.  Musculoskeletal: She exhibits no edema and no tenderness.  Neurological: She is alert. She has normal strength. No cranial nerve deficit (no facial droop, extraocular movements intact, no slurred speech) or sensory deficit. She exhibits normal muscle tone. She displays no seizure activity. Coordination normal.  Skin: Skin is warm and dry. No rash noted.  Psychiatric: She has a normal mood and affect.    ED Course  Procedures (including critical care time) Labs Review Labs Reviewed  COMPREHENSIVE METABOLIC PANEL - Abnormal; Notable for the following:    Potassium 3.4 (*)    Glucose, Bld 123 (*)    GFR calc non Af Amer 66 (*)    GFR calc Af Amer 76 (*)    All other components within normal limits  URINALYSIS, ROUTINE W REFLEX MICROSCOPIC - Abnormal; Notable for the following:    APPearance CLOUDY (*)    Leukocytes, UA SMALL (*)    All other components within normal limits  URINE MICROSCOPIC-ADD ON - Abnormal; Notable for the following:    Bacteria, UA FEW (*)    All other components within normal limits  CBC WITH DIFFERENTIAL  LIPASE, BLOOD    Imaging Review Ct Abdomen Pelvis Wo Contrast  02/17/2014   CLINICAL DATA:  Increased pain in the right abdomen and flank. Previous negative ultrasound of gallbladder.  EXAM: CT ABDOMEN AND PELVIS WITHOUT CONTRAST  TECHNIQUE: Multidetector CT imaging of the abdomen and pelvis was performed following the standard protocol without IV contrast.  COMPARISON:  CT abdomen and pelvis 07/04/2010  FINDINGS: Slight fibrosis in the right lung base.  The kidneys appear symmetrical in size and shape. No hydronephrosis or hydroureter. No renal, ureteral, or bladder stones. Bladder wall is not  thickened.  Focal calcification in the gallbladder appears to be in wall rather than a stone. No inflammatory changes involving the gallbladder. The unenhanced appearance of the liver, spleen, pancreas, adrenal glands, abdominal aorta, and retroperitoneal lymph nodes is unremarkable. Stomach and small bowel are decompressed. Stool-filled colon without distention. No free air or free fluid in the abdomen. Minimal umbilical hernia containing fat.  Pelvis: Surgical absence of the uterus. No pelvic mass or lymphadenopathy. An appendiceal stump is visualized and appears normal. Remainder the appendix is not definitively identified. This may be obscured or surgically absent. No free or loculated pelvic fluid collections. No evidence of diverticulitis. Calcifications in the subcutaneous fat of the gluteal regions likely representing injection granulomas. The degenerative changes in the lumbar spine. Mild anterior subluxation of L4 on L5 probably degenerative. No destructive bone lesions appreciated.  IMPRESSION: No renal or ureteral stone or obstruction.   Electronically Signed  By: Lucienne Capers M.D.   On: 02/17/2014 23:03     MDM   Final diagnoses:  Flank pain    Pt with complaints of pain in the ruq.  No ttp on exam.  Recent US and CT scan normal.  Few WBCs in the urine.  Will dc home on abx.  Rx for pain meds.  Recc follow up with PCP and GI.  At this time there does not appear to be any evidence of an acute emergency medical condition and the patient appears stable for discharge with appropriate outpatient follow up.    Dorie Rank, MD 02/17/14 2352

## 2014-02-17 NOTE — Discharge Instructions (Signed)

## 2014-02-17 NOTE — ED Notes (Signed)
Pt states she has had an increase pain in her right abdomen and flank area. She states she was previously seen at [facility] for an ultrasound of her gallbladder, with negative results. She denies taking any OTC medications for pain relief. Pt denies injury to lower back area. Pt denies any history of kidney stones. Pt states she has had normal urine pattern. Pt is calm and complains of constant pain. Family is present at this time. Will continue to monitor.

## 2014-02-17 NOTE — ED Notes (Signed)
Pt complains of right flank pain for three days, no urinary problems, no injury

## 2014-02-26 ENCOUNTER — Emergency Department (HOSPITAL_COMMUNITY): Payer: Medicare HMO

## 2014-02-26 ENCOUNTER — Encounter (HOSPITAL_COMMUNITY): Payer: Self-pay | Admitting: Emergency Medicine

## 2014-02-26 ENCOUNTER — Emergency Department (HOSPITAL_COMMUNITY)
Admission: EM | Admit: 2014-02-26 | Discharge: 2014-02-27 | Disposition: A | Discharge status: Home / Self Care | Payer: Medicare HMO | Attending: Emergency Medicine | Admitting: Emergency Medicine

## 2014-02-26 DIAGNOSIS — J45909 Unspecified asthma, uncomplicated: Secondary | ICD-10-CM | POA: Insufficient documentation

## 2014-02-26 DIAGNOSIS — R1013 Epigastric pain: Secondary | ICD-10-CM | POA: Diagnosis not present

## 2014-02-26 DIAGNOSIS — Z87891 Personal history of nicotine dependence: Secondary | ICD-10-CM | POA: Diagnosis not present

## 2014-02-26 DIAGNOSIS — Z79899 Other long term (current) drug therapy: Secondary | ICD-10-CM | POA: Diagnosis not present

## 2014-02-26 DIAGNOSIS — K219 Gastro-esophageal reflux disease without esophagitis: Secondary | ICD-10-CM | POA: Insufficient documentation

## 2014-02-26 DIAGNOSIS — I10 Essential (primary) hypertension: Secondary | ICD-10-CM | POA: Insufficient documentation

## 2014-02-26 DIAGNOSIS — R112 Nausea with vomiting, unspecified: Secondary | ICD-10-CM | POA: Insufficient documentation

## 2014-02-26 DIAGNOSIS — R109 Unspecified abdominal pain: Secondary | ICD-10-CM

## 2014-02-26 LAB — I-STAT TROPONIN, ED: Troponin i, poc: 0.01 ng/mL (ref 0.00–0.08)

## 2014-02-26 LAB — COMPREHENSIVE METABOLIC PANEL
ALT: 9 U/L (ref 0–35)
AST: 18 U/L (ref 0–37)
Albumin: 3.8 g/dL (ref 3.5–5.2)
Alkaline Phosphatase: 87 U/L (ref 39–117)
Anion gap: 15 (ref 5–15)
BUN: 12 mg/dL (ref 6–23)
CO2: 23 mEq/L (ref 19–32)
Calcium: 9.4 mg/dL (ref 8.4–10.5)
Chloride: 99 mEq/L (ref 96–112)
Creatinine, Ser: 0.87 mg/dL (ref 0.50–1.10)
GFR calc Af Amer: 74 mL/min — ABNORMAL LOW (ref >90)
GFR calc non Af Amer: 64 mL/min — ABNORMAL LOW (ref >90)
Glucose, Bld: 94 mg/dL (ref 70–99)
Potassium: 3.7 mEq/L (ref 3.7–5.3)
Sodium: 137 mEq/L (ref 137–147)
Total Bilirubin: 0.5 mg/dL (ref 0.3–1.2)
Total Protein: 7.9 g/dL (ref 6.0–8.3)

## 2014-02-26 LAB — CBC WITH DIFFERENTIAL/PLATELET
Basophils Absolute: 0.1 10*3/uL (ref 0.0–0.1)
Basophils Relative: 1 % (ref 0–1)
Eosinophils Absolute: 0.4 10*3/uL (ref 0.0–0.7)
Eosinophils Relative: 5 % (ref 0–5)
HCT: 40.9 % (ref 36.0–46.0)
Hemoglobin: 14 g/dL (ref 12.0–15.0)
Lymphocytes Relative: 43 % (ref 12–46)
Lymphs Abs: 3.9 10*3/uL (ref 0.7–4.0)
MCH: 30.4 pg (ref 26.0–34.0)
MCHC: 34.2 g/dL (ref 30.0–36.0)
MCV: 88.9 fL (ref 78.0–100.0)
Monocytes Absolute: 0.6 10*3/uL (ref 0.1–1.0)
Monocytes Relative: 7 % (ref 3–12)
Neutro Abs: 3.9 10*3/uL (ref 1.7–7.7)
Neutrophils Relative %: 44 % (ref 43–77)
Platelets: 298 10*3/uL (ref 150–400)
RBC: 4.6 MIL/uL (ref 3.87–5.11)
RDW: 13.4 % (ref 11.5–15.5)
WBC: 8.8 10*3/uL (ref 4.0–10.5)

## 2014-02-26 LAB — URINALYSIS, ROUTINE W REFLEX MICROSCOPIC
Bilirubin Urine: NEGATIVE
Glucose, UA: NEGATIVE mg/dL
Hgb urine dipstick: NEGATIVE
Ketones, ur: NEGATIVE mg/dL
Nitrite: NEGATIVE
Protein, ur: NEGATIVE mg/dL
Specific Gravity, Urine: 1.003 — ABNORMAL LOW (ref 1.005–1.030)
Urobilinogen, UA: 0.2 mg/dL (ref 0.0–1.0)
pH: 6 (ref 5.0–8.0)

## 2014-02-26 LAB — URINE MICROSCOPIC-ADD ON

## 2014-02-26 LAB — LIPASE, BLOOD: Lipase: 25 U/L (ref 11–59)

## 2014-02-26 IMAGING — CR DG ABDOMEN ACUTE W/ 1V CHEST
3 series · 3 of 3 positions shown · non-contrast
Comparison: CT [DATE].  Chest x-ray [DATE] and [DATE].

CLINICAL DATA: Abdominal pain and nausea.  Initial evaluation.

EXAM:
ACUTE ABDOMEN SERIES (ABDOMEN 2 VIEW & CHEST 1 VIEW)

[w chest pa]
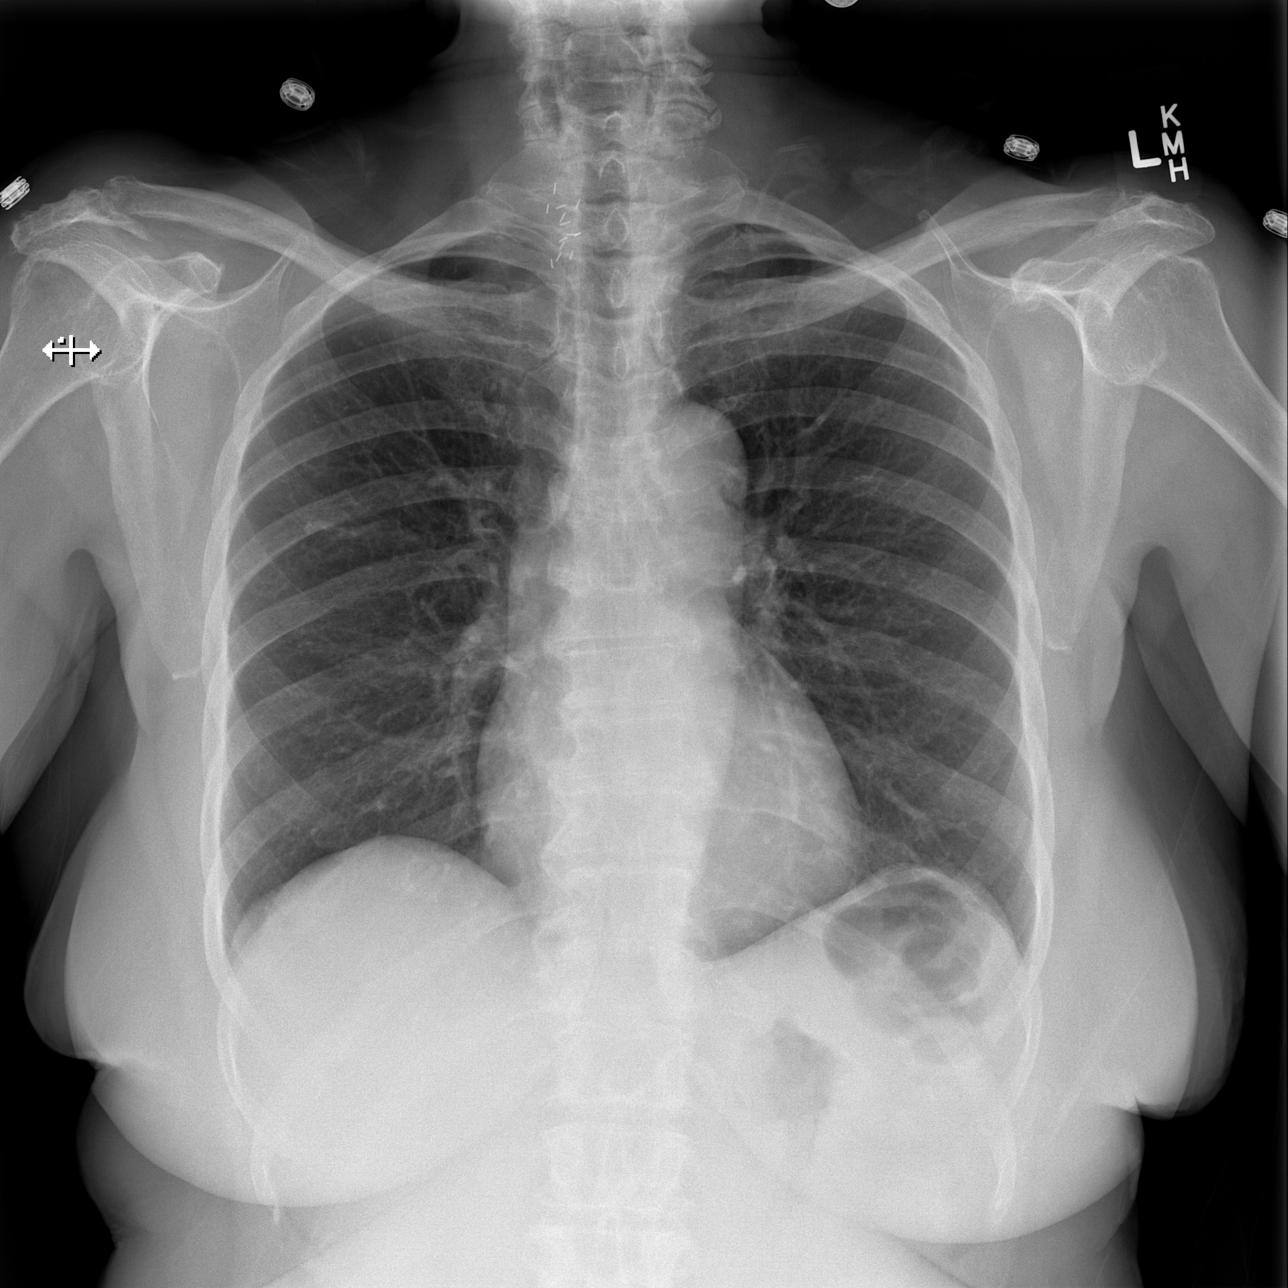

[w abdomen upright]
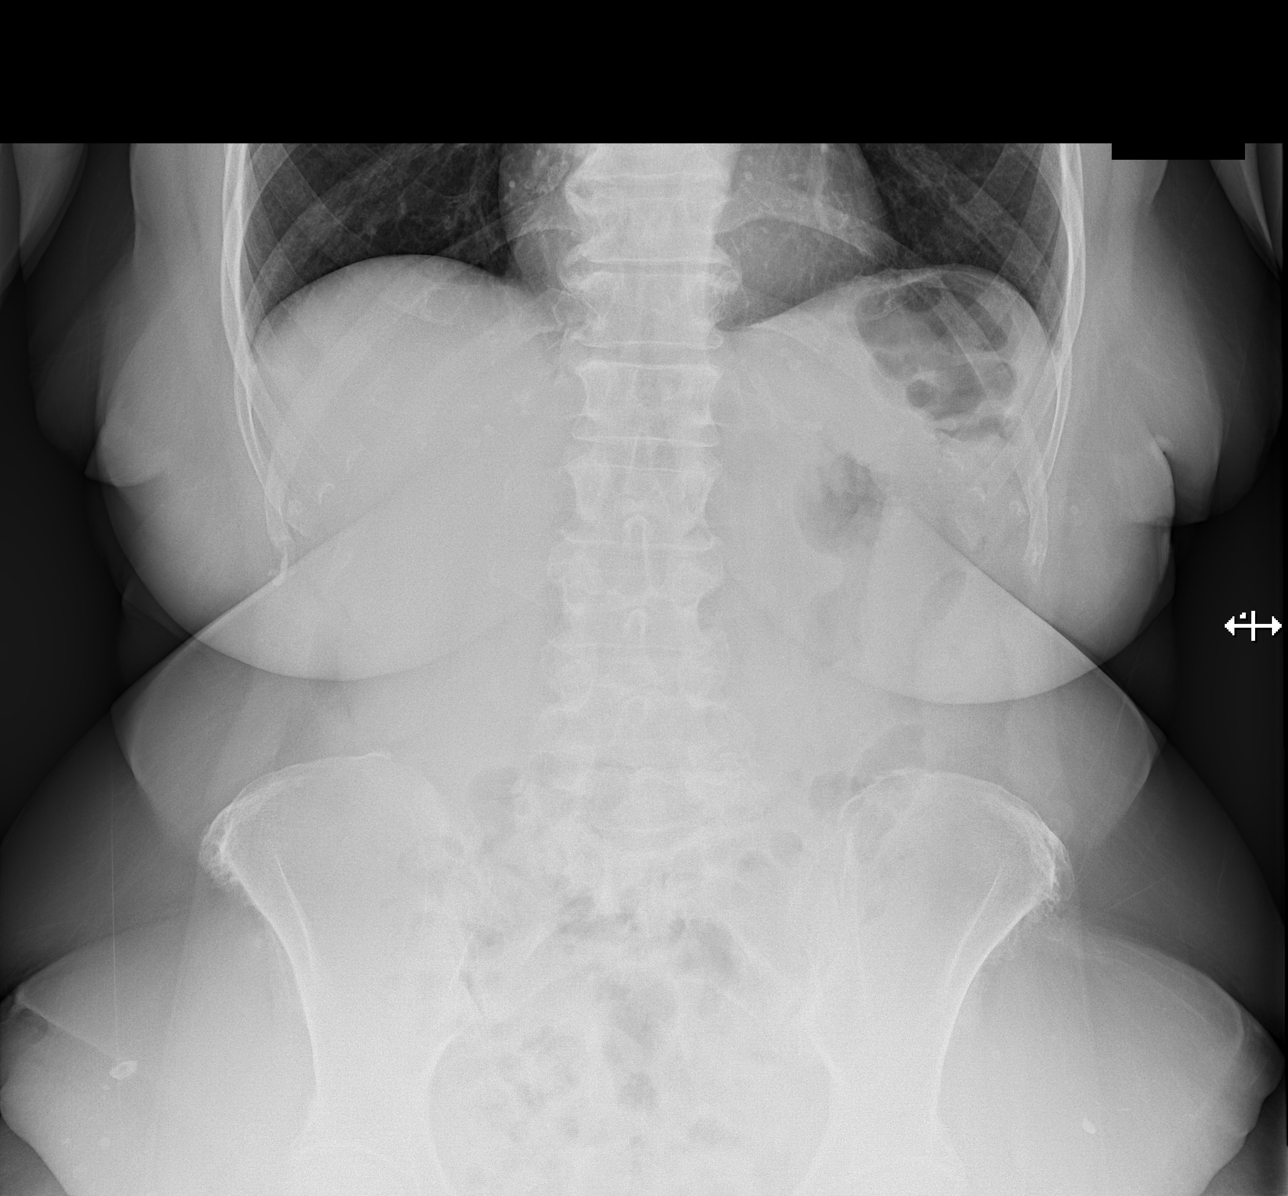

[t abdomen supine]
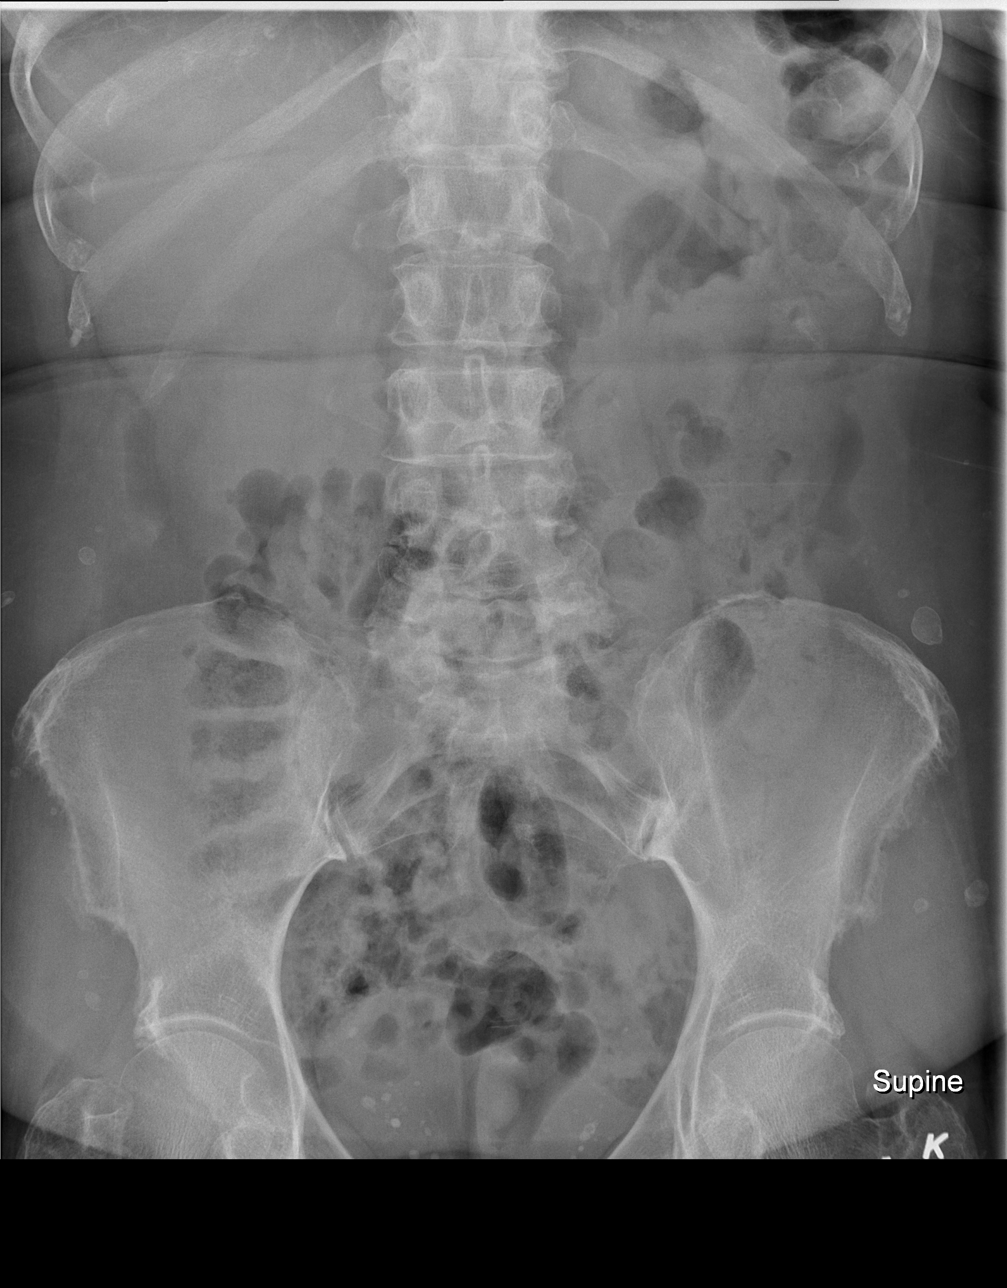

[3 of 3 positions shown; findings below may reference images not displayed]

FINDINGS: Mediastinum and hilar structures normal the lungs are clear of acute
infiltrates. Mild pleural parenchymal scarring right upper lobe.
Heart size normal. No pleural effusion or pneumothorax. Surgical
clips right peritracheal region.

Soft tissue structures evident unremarkable. Gas pattern
nonspecific. No free air. Pelvic calcifications. Calcified injection
granulomas. No acute bony abnormality appear
IMPRESSION: Negative abdominal radiographs.  No acute cardiopulmonary disease.

## 2014-02-26 MED ORDER — SODIUM CHLORIDE 0.9 % IV BOLUS (SEPSIS)
1000.0000 mL | Freq: Once | INTRAVENOUS | Status: AC
  Administered 2014-02-26: 1000 mL via INTRAVENOUS

## 2014-02-26 MED ORDER — TRAMADOL HCL 50 MG PO TABS: 50.0000 mg | ORAL_TABLET | Freq: Four times a day (QID) | ORAL | Status: DC | PRN

## 2014-02-26 MED ORDER — ONDANSETRON HCL 4 MG PO TABS: 4.0000 mg | ORAL_TABLET | Freq: Three times a day (TID) | ORAL | Status: DC | PRN

## 2014-02-26 MED ORDER — MORPHINE SULFATE 4 MG/ML IJ SOLN
4.0000 mg | Freq: Once | INTRAMUSCULAR | Status: AC
  Administered 2014-02-26: 4 mg via INTRAVENOUS
  Filled 2014-02-26: qty 1

## 2014-02-26 MED ORDER — ONDANSETRON HCL 4 MG/2ML IJ SOLN
4.0000 mg | Freq: Once | INTRAMUSCULAR | Status: AC
  Administered 2014-02-26: 4 mg via INTRAVENOUS
  Filled 2014-02-26: qty 2

## 2014-02-26 MED ORDER — FAMOTIDINE IN NACL 20-0.9 MG/50ML-% IV SOLN
20.0000 mg | Freq: Once | INTRAVENOUS | Status: AC
  Administered 2014-02-26: 20 mg via INTRAVENOUS
  Filled 2014-02-26: qty 50

## 2014-02-26 NOTE — ED Notes (Signed)
Patient transported to X-ray 

## 2014-02-26 NOTE — ED Provider Notes (Signed)
CSN: [id number]     Arrival date & time 02/26/14  2043 History   First MD Initiated Contact with Patient 02/26/14 2156     Chief Complaint  Patient presents with  . Abdominal Pain     (Consider location/radiation/quality/duration/timing/severity/associated sxs/prior Treatment) The history is provided by the patient.  Angelica [name] is a 73 y.o. female hx of HTN, asthma, GERD here with nausea, vomiting, abdominal pain. Symptoms have been going on for years. She was seen in the ED a week ago for same complaint and had nl CT ab/pel. She states that her symptoms got worse today. She had several episodes of vomiting and had worsening abdominal tightness. Denies urinary symptoms. States that pain is a burning sensation in her chest as well. Has seen PCP and GI and is scheduled for endoscopy in November.    Past Medical History  Diagnosis Date  . Hypertension   . Asthma   . Environmental allergies   . GERD (gastroesophageal reflux disease)    Past Surgical History  Procedure Laterality Date  . Knee surgery    . Nasal sinus surgery    . Thyroid surgery     No family history on file. History  Substance Use Topics  . Smoking status: Former Research scientist (life sciences)  . Smokeless tobacco: Not on file  . Alcohol Use: No   OB History   Grav Para Term Preterm Abortions TAB SAB Ect Mult Living                 Review of Systems  Gastrointestinal: Positive for nausea, vomiting and abdominal pain.  All other systems reviewed and are negative.     Allergies  Asa buff (mag; Fish allergy; Other; Peanut-containing drug products; Penicillins; Shellfish-derived products; and Strawberry  Home Medications   Prior to Admission medications   Medication Sig Start Date End Date Taking? Authorizing Provider  acetaminophen (TYLENOL) 500 MG tablet Take 500 mg by mouth daily as needed for mild pain, moderate pain, fever or headache. For pain   Yes Historical Provider, MD  albuterol (PROVENTIL) (2.5 MG/3ML) 0.083%  nebulizer solution Take 2.5 mg by nebulization 2 (two) times daily as needed for wheezing or shortness of breath.    Yes Historical Provider, MD  Azelastine HCl (ASTEPRO) 0.15 % SOLN Place 2 sprays into the nose daily.    Yes Historical Provider, MD  budesonide-formoterol (SYMBICORT) 160-4.5 MCG/ACT inhaler Inhale 2 puffs into the lungs daily.   Yes Historical Provider, MD  loratadine (CLARITIN) 10 MG tablet Take 10 mg by mouth daily as needed. For allergy symptoms   Yes Historical Provider, MD  losartan-hydrochlorothiazide (HYZAAR) 100-12.5 MG per tablet Take 1 tablet by mouth daily.   Yes Historical Provider, MD  omeprazole (PRILOSEC) 40 MG capsule Take 40 mg by mouth daily.   Yes Historical Provider, MD  traMADol (ULTRAM) 50 MG tablet Take 1 tablet (50 mg total) by mouth every 6 (six) hours as needed. 02/17/14  Yes Dorie Rank, MD   BP 118/57  Pulse 66  Temp(Src) 97.9 F (36.6 C) (Oral)  Resp 17  Ht 5\' 5"  (1.651 m)  Wt 181 lb (82.101 kg)  BMI 30.12 kg/m2  SpO2 96% Physical Exam  Nursing note and vitals reviewed. Constitutional: She is oriented to person, place, and time.  Chronically ill, slightly uncomfortable   HENT:  Head: Normocephalic.  Mouth/Throat: Oropharynx is clear and moist.  Eyes: Conjunctivae and EOM are normal. Pupils are equal, round, and reactive to light.  Neck: Normal  range of motion. Neck supple.  Cardiovascular: Normal rate, regular rhythm and normal heart sounds.   Pulmonary/Chest: Effort normal and breath sounds normal. No respiratory distress. She has no wheezes. She has no rales.  Abdominal: Soft. Bowel sounds are normal.  Mild epigastric tenderness, no rebound. No CVAT   Musculoskeletal: Normal range of motion. She exhibits no edema and no tenderness.  Neurological: She is alert and oriented to person, place, and time. No cranial nerve deficit. Coordination normal.  Skin: Skin is warm and dry.  Psychiatric: She has a normal mood and affect. Her behavior is  normal. Judgment and thought content normal.    ED Course  Procedures (including critical care time) Labs Review Labs Reviewed  COMPREHENSIVE METABOLIC PANEL - Abnormal; Notable for the following:    GFR calc non Af Amer 64 (*)    GFR calc Af Amer 74 (*)    All other components within normal limits  URINALYSIS, ROUTINE W REFLEX MICROSCOPIC - Abnormal; Notable for the following:    APPearance CLOUDY (*)    Specific Gravity, Urine 1.003 (*)    Leukocytes, UA SMALL (*)    All other components within normal limits  CBC WITH DIFFERENTIAL  LIPASE, BLOOD  URINE MICROSCOPIC-ADD ON  I-STAT TROPOININ, ED    Imaging Review Dg Abd Acute W/chest  02/26/2014   CLINICAL DATA:  Abdominal pain and nausea.  Initial evaluation.  EXAM: ACUTE ABDOMEN SERIES (ABDOMEN 2 VIEW & CHEST 1 VIEW)  COMPARISON:  CT 02/17/2014.  Chest x-ray 02/04/2014 and 05/03/2013.  FINDINGS: Mediastinum and hilar structures normal the lungs are clear of acute infiltrates. Mild pleural parenchymal scarring right upper lobe. Heart size normal. No pleural effusion or pneumothorax. Surgical clips right peritracheal region.  Soft tissue structures evident unremarkable. Gas pattern nonspecific. No free air. Pelvic calcifications. Calcified injection granulomas. No acute bony abnormality appear  IMPRESSION: Negative abdominal radiographs.  No acute cardiopulmonary disease.   Electronically Signed   By: Marcello Moores  Register   On: 02/26/2014 22:18     EKG Interpretation   Date/Time:  Friday February 26 2014 20:51:15 EDT Ventricular Rate:  67 PR Interval:  166 QRS Duration: 87 QT Interval:  414 QTC Calculation: 437 R Axis:   -18 Text Interpretation:  Sinus rhythm Left atrial enlargement Borderline left  axis deviation Low voltage, precordial leads Borderline T abnormalities,  anterior leads No significant change since last tracing Confirmed by Makenleigh Crownover   MD, Lynsie Mcwatters ([id number]) on 02/26/2014 10:42:08 PM      MDM   Final diagnoses:  None     Angelica [name] is a 73 y.o. female here with ab pain, nausea, vomiting. Symptoms are chronic. Had CT and [place] recently. Will get xray to r/o SBO given multiple ab surgeries. Will check labs, UA and hydrate and reassess.   11:33 PM Labs at baseline. Xray showed no SBO. Patient felt better with meds. Will d/c home with tramadol, zofran. Has GI f/u.      Wandra Arthurs, MD 02/26/14 [phone]

## 2014-02-26 NOTE — ED Notes (Signed)
Pt c/o mid abd pain with n/v. Per family pt is being followed by PCP for same, has endo on November 5th. Pain worse today with tightness in back.

## 2014-02-26 NOTE — Discharge Instructions (Signed)
Take ultram for pain.   Take zofran for nausea.   Stay hydrated.   Follow up with your GI doctor.   Return to ER if you have severe pain, vomiting, fevers, dehydration.

## 2014-03-17 ENCOUNTER — Other Ambulatory Visit: Payer: Self-pay | Admitting: Gastroenterology

## 2014-05-24 ENCOUNTER — Other Ambulatory Visit: Payer: Self-pay | Admitting: Internal Medicine

## 2014-05-24 DIAGNOSIS — Z1231 Encounter for screening mammogram for malignant neoplasm of breast: Secondary | ICD-10-CM

## 2014-06-23 ENCOUNTER — Ambulatory Visit (HOSPITAL_COMMUNITY)
Admission: RE | Admit: 2014-06-23 | Discharge: 2014-06-23 | Disposition: A | Discharge status: Home / Self Care | Payer: Medicare HMO | Source: Ambulatory Visit | Attending: Internal Medicine | Admitting: Internal Medicine

## 2014-06-23 DIAGNOSIS — Z1231 Encounter for screening mammogram for malignant neoplasm of breast: Secondary | ICD-10-CM | POA: Insufficient documentation

## 2014-06-28 DIAGNOSIS — I10 Essential (primary) hypertension: Secondary | ICD-10-CM | POA: Diagnosis not present

## 2014-06-28 DIAGNOSIS — M5413 Radiculopathy, cervicothoracic region: Secondary | ICD-10-CM | POA: Diagnosis not present

## 2014-06-28 DIAGNOSIS — E780 Pure hypercholesterolemia: Secondary | ICD-10-CM | POA: Diagnosis not present

## 2014-07-15 DIAGNOSIS — E780 Pure hypercholesterolemia: Secondary | ICD-10-CM | POA: Diagnosis not present

## 2014-07-15 DIAGNOSIS — I10 Essential (primary) hypertension: Secondary | ICD-10-CM | POA: Diagnosis not present

## 2014-07-15 DIAGNOSIS — M150 Primary generalized (osteo)arthritis: Secondary | ICD-10-CM | POA: Diagnosis not present

## 2014-07-27 DIAGNOSIS — H40023 Open angle with borderline findings, high risk, bilateral: Secondary | ICD-10-CM | POA: Diagnosis not present

## 2014-08-02 DIAGNOSIS — K5901 Slow transit constipation: Secondary | ICD-10-CM | POA: Diagnosis not present

## 2014-08-02 DIAGNOSIS — K219 Gastro-esophageal reflux disease without esophagitis: Secondary | ICD-10-CM | POA: Diagnosis not present

## 2014-08-03 ENCOUNTER — Encounter (HOSPITAL_COMMUNITY): Payer: Self-pay | Admitting: *Deleted

## 2014-08-03 ENCOUNTER — Emergency Department (HOSPITAL_COMMUNITY)
Admission: EM | Admit: 2014-08-03 | Discharge: 2014-08-04 | Disposition: A | Discharge status: Home / Self Care | Payer: Commercial Managed Care - HMO | Attending: Emergency Medicine | Admitting: Emergency Medicine

## 2014-08-03 DIAGNOSIS — Z87891 Personal history of nicotine dependence: Secondary | ICD-10-CM | POA: Insufficient documentation

## 2014-08-03 DIAGNOSIS — Z880 Allergy status to penicillin: Secondary | ICD-10-CM | POA: Diagnosis not present

## 2014-08-03 DIAGNOSIS — R05 Cough: Secondary | ICD-10-CM | POA: Diagnosis not present

## 2014-08-03 DIAGNOSIS — J45909 Unspecified asthma, uncomplicated: Secondary | ICD-10-CM | POA: Insufficient documentation

## 2014-08-03 DIAGNOSIS — Z79899 Other long term (current) drug therapy: Secondary | ICD-10-CM | POA: Insufficient documentation

## 2014-08-03 DIAGNOSIS — Z8619 Personal history of other infectious and parasitic diseases: Secondary | ICD-10-CM | POA: Insufficient documentation

## 2014-08-03 DIAGNOSIS — K219 Gastro-esophageal reflux disease without esophagitis: Secondary | ICD-10-CM | POA: Diagnosis not present

## 2014-08-03 DIAGNOSIS — I10 Essential (primary) hypertension: Secondary | ICD-10-CM | POA: Diagnosis not present

## 2014-08-03 DIAGNOSIS — R109 Unspecified abdominal pain: Secondary | ICD-10-CM | POA: Diagnosis present

## 2014-08-03 HISTORY — DX: Other specified bacterial intestinal infections: A04.8

## 2014-08-03 NOTE — ED Notes (Signed)
Pt reports worsening "bloated" feeling.  Pt reports hx of H. Pylori and GERD.  Pt also reports nausea.

## 2014-08-04 ENCOUNTER — Emergency Department (HOSPITAL_COMMUNITY): Payer: Commercial Managed Care - HMO

## 2014-08-04 DIAGNOSIS — I10 Essential (primary) hypertension: Secondary | ICD-10-CM | POA: Diagnosis not present

## 2014-08-04 DIAGNOSIS — R05 Cough: Secondary | ICD-10-CM | POA: Diagnosis not present

## 2014-08-04 DIAGNOSIS — K219 Gastro-esophageal reflux disease without esophagitis: Secondary | ICD-10-CM | POA: Diagnosis not present

## 2014-08-04 DIAGNOSIS — Z8619 Personal history of other infectious and parasitic diseases: Secondary | ICD-10-CM | POA: Diagnosis not present

## 2014-08-04 DIAGNOSIS — Z87891 Personal history of nicotine dependence: Secondary | ICD-10-CM | POA: Diagnosis not present

## 2014-08-04 DIAGNOSIS — Z79899 Other long term (current) drug therapy: Secondary | ICD-10-CM | POA: Diagnosis not present

## 2014-08-04 DIAGNOSIS — J45909 Unspecified asthma, uncomplicated: Secondary | ICD-10-CM | POA: Diagnosis not present

## 2014-08-04 DIAGNOSIS — Z880 Allergy status to penicillin: Secondary | ICD-10-CM | POA: Diagnosis not present

## 2014-08-04 LAB — URINALYSIS, ROUTINE W REFLEX MICROSCOPIC
Bilirubin Urine: NEGATIVE
Glucose, UA: NEGATIVE mg/dL
Hgb urine dipstick: NEGATIVE
Ketones, ur: NEGATIVE mg/dL
Leukocytes, UA: NEGATIVE
Nitrite: NEGATIVE
Protein, ur: NEGATIVE mg/dL
Specific Gravity, Urine: 1.004 — ABNORMAL LOW (ref 1.005–1.030)
Urobilinogen, UA: 0.2 mg/dL (ref 0.0–1.0)
pH: 6 (ref 5.0–8.0)

## 2014-08-04 LAB — BASIC METABOLIC PANEL
Anion gap: 8 (ref 5–15)
BUN: 15 mg/dL (ref 6–23)
CO2: 24 mmol/L (ref 19–32)
Calcium: 9 mg/dL (ref 8.4–10.5)
Chloride: 105 mmol/L (ref 96–112)
Creatinine, Ser: 0.71 mg/dL (ref 0.50–1.10)
GFR calc Af Amer: >90 mL/min (ref >90)
GFR calc non Af Amer: 83 mL/min — ABNORMAL LOW (ref >90)
Glucose, Bld: 87 mg/dL (ref 70–99)
Potassium: 2.9 mmol/L — ABNORMAL LOW (ref 3.5–5.1)
Sodium: 137 mmol/L (ref 135–145)

## 2014-08-04 LAB — I-STAT CHEM 8, ED
BUN: 16 mg/dL (ref 6–23)
Calcium, Ion: 1.14 mmol/L (ref 1.13–1.30)
Chloride: 102 mmol/L (ref 96–112)
Creatinine, Ser: 0.7 mg/dL (ref 0.50–1.10)
Glucose, Bld: 84 mg/dL (ref 70–99)
HCT: 43 % (ref 36.0–46.0)
Hemoglobin: 14.6 g/dL (ref 12.0–15.0)
Potassium: 3.2 mmol/L — ABNORMAL LOW (ref 3.5–5.1)
Sodium: 139 mmol/L (ref 135–145)
TCO2: 22 mmol/L (ref 0–100)

## 2014-08-04 LAB — CBC
HCT: 40.2 % (ref 36.0–46.0)
Hemoglobin: 13.3 g/dL (ref 12.0–15.0)
MCH: 30 pg (ref 26.0–34.0)
MCHC: 33.1 g/dL (ref 30.0–36.0)
MCV: 90.5 fL (ref 78.0–100.0)
Platelets: 329 10*3/uL (ref 150–400)
RBC: 4.44 MIL/uL (ref 3.87–5.11)
RDW: 13.7 % (ref 11.5–15.5)
WBC: 9.4 10*3/uL (ref 4.0–10.5)

## 2014-08-04 IMAGING — CR DG CHEST 2V
2 series · 2 of 2 positions shown · non-contrast
Comparison: Chest radiograph performed [DATE]

CLINICAL DATA: Acute onset of cough, congestion, shortness of
breath and chest pressure. Initial encounter.

EXAM:
CHEST  2 VIEW

[w chest pa]
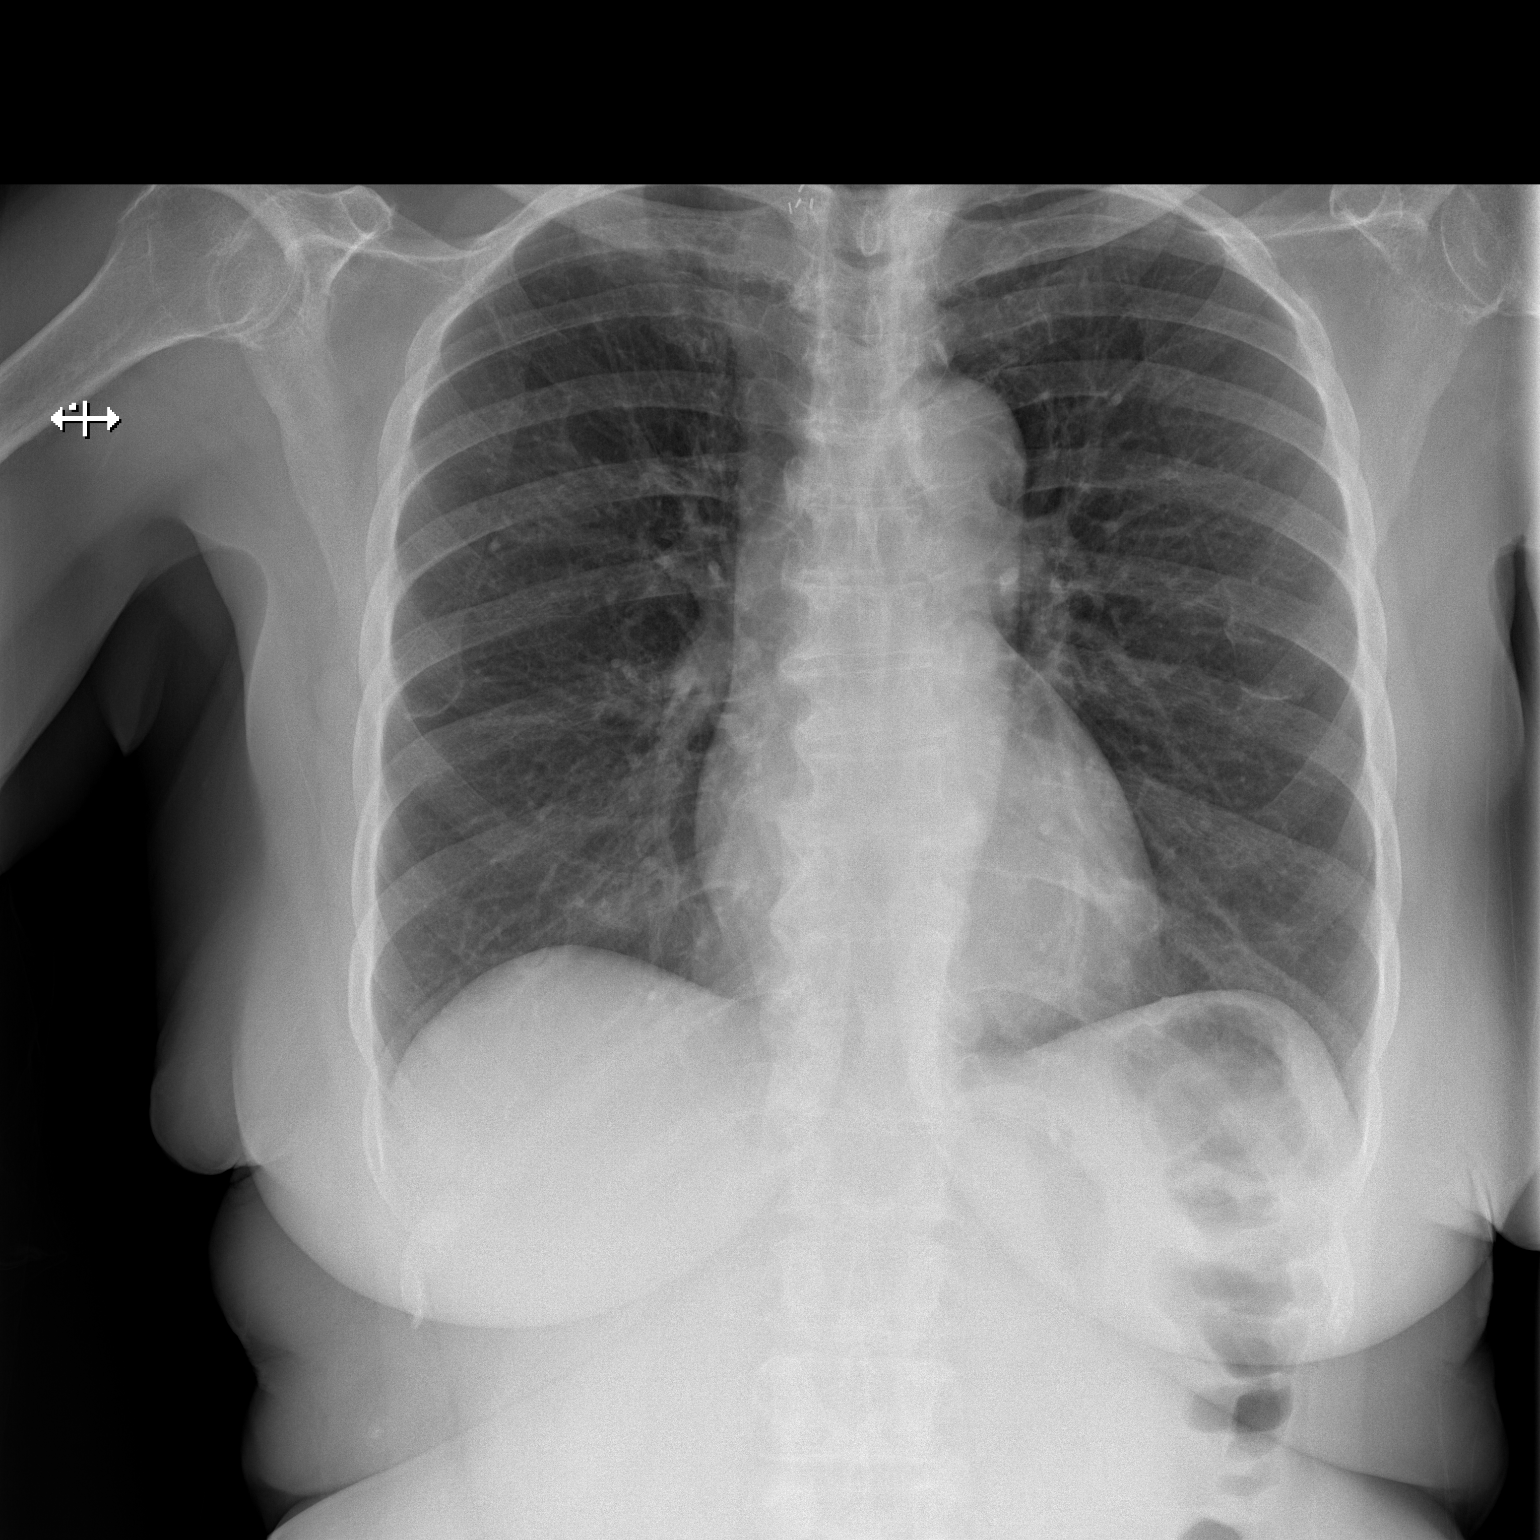

[w chest lat]
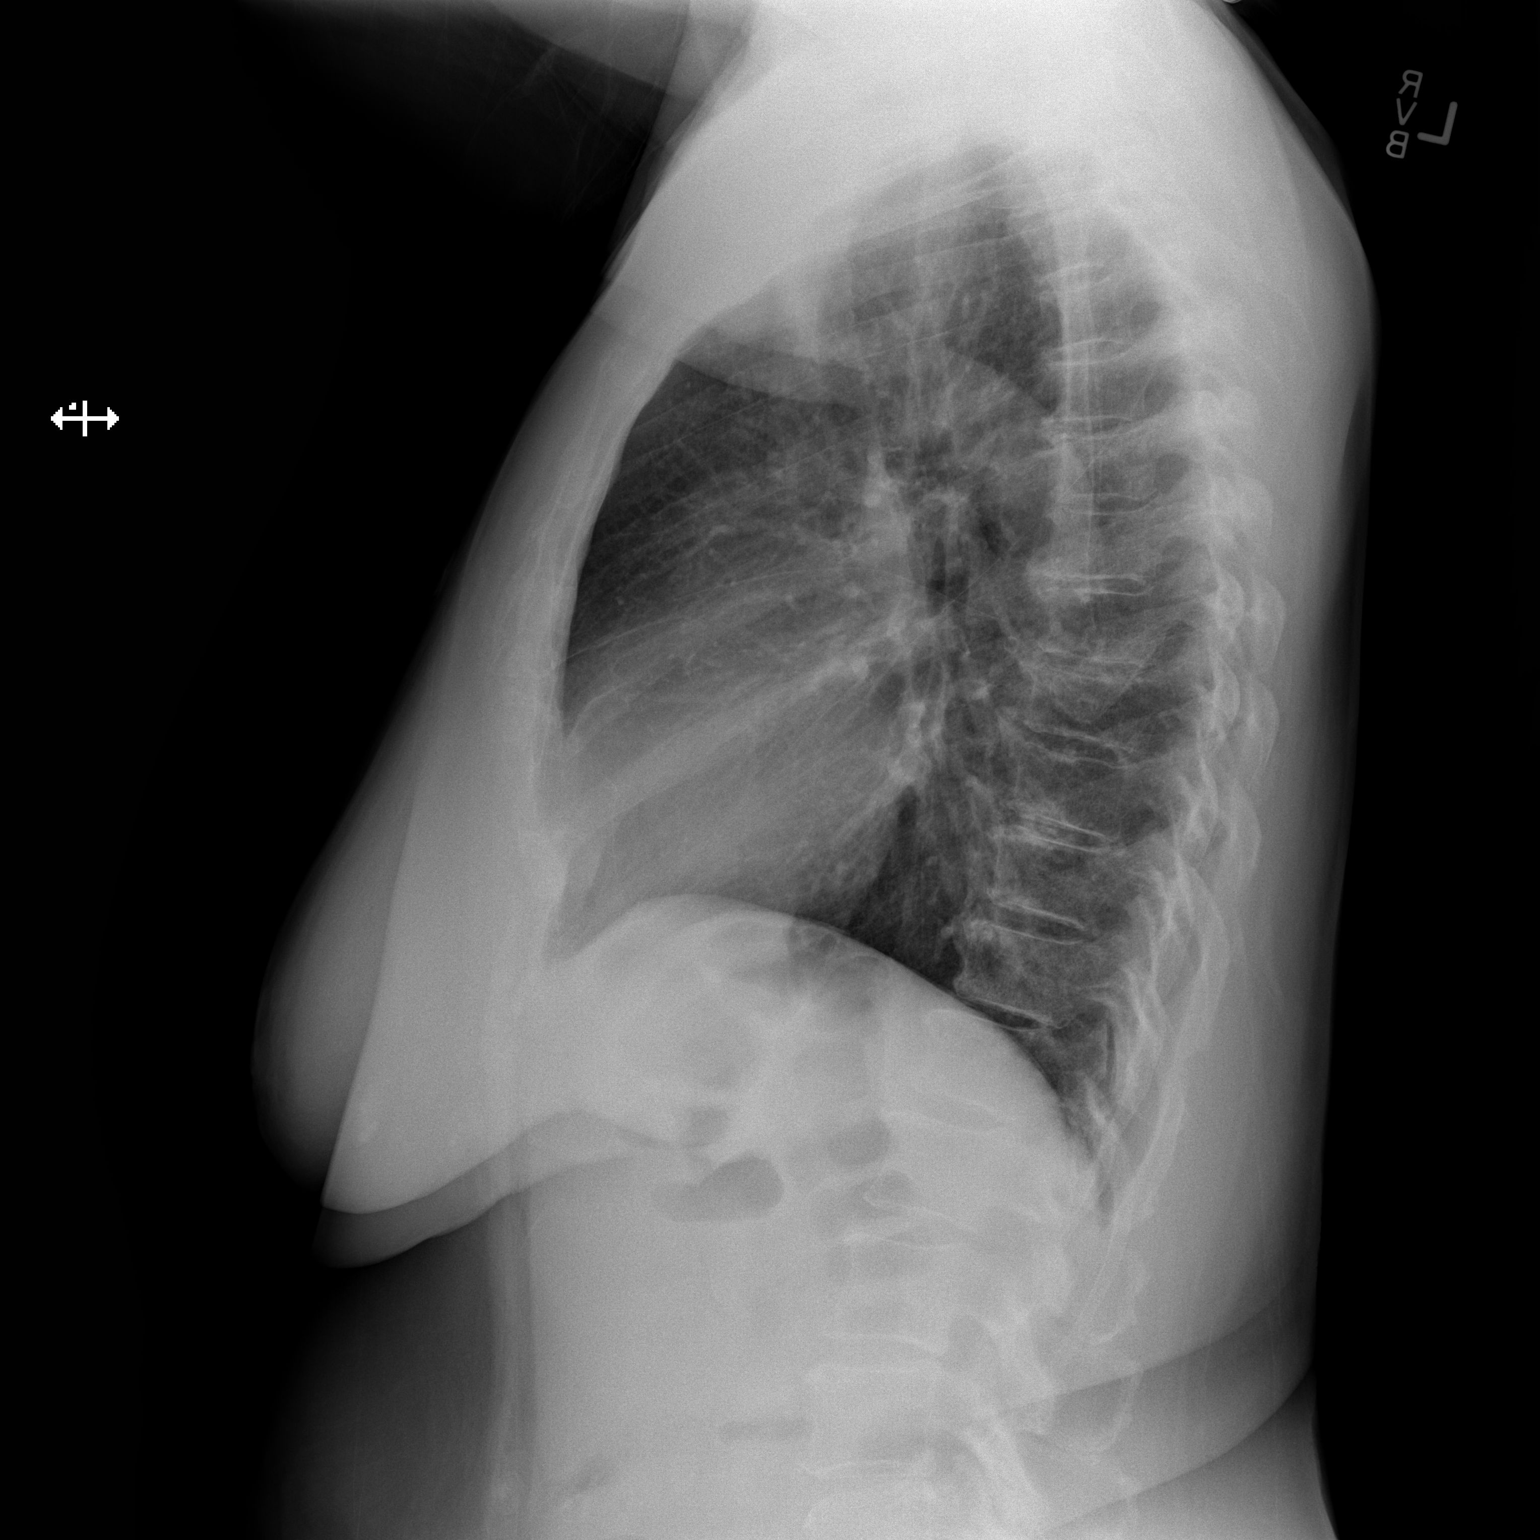

[2 of 2 positions shown; findings below may reference images not displayed]

FINDINGS: The lungs are well-aerated and clear. There is no evidence of focal
opacification, pleural effusion or pneumothorax.

The heart is normal in size; the mediastinal contour is within
normal limits. No acute osseous abnormalities are seen.
IMPRESSION: No acute cardiopulmonary process seen.

## 2014-08-04 MED ORDER — SUCRALFATE 1 GM/10ML PO SUSP: 1.0000 g | Freq: Three times a day (TID) | ORAL | Status: DC

## 2014-08-04 MED ORDER — GI COCKTAIL ~~LOC~~
30.0000 mL | Freq: Once | ORAL | Status: AC
  Administered 2014-08-04: 30 mL via ORAL
  Filled 2014-08-04: qty 30

## 2014-08-04 MED ORDER — TRAMADOL HCL 50 MG PO TABS
50.0000 mg | ORAL_TABLET | Freq: Once | ORAL | Status: AC
  Administered 2014-08-04: 50 mg via ORAL
  Filled 2014-08-04: qty 1

## 2014-08-04 NOTE — ED Provider Notes (Signed)
CSN: [id number]     Arrival date & time 08/03/14  2333 History   First MD Initiated Contact with Patient 08/04/14 0135     Chief Complaint  Patient presents with  . Abdominal Pain     (Consider location/radiation/quality/duration/timing/severity/associated sxs/prior Treatment) Patient is a 74 y.o. female presenting with cough. The history is provided by the patient.  Cough Cough characteristics:  Non-productive Severity:  Moderate Onset quality:  Gradual Duration: 30 years. Timing:  Intermittent Progression:  Unchanged Chronicity:  Chronic Context: not animal exposure   Context comment:  Sees Dr. Sarina Ser for acid reflux Relieved by:  Nothing Worsened by:  Nothing tried Ineffective treatments:  None tried Associated symptoms: no chest pain and no shortness of breath   Associated symptoms comment:  Feels bloated Risk factors: no recent travel     Past Medical History  Diagnosis Date  . Hypertension   . Asthma   . Environmental allergies   . GERD (gastroesophageal reflux disease)   . H. pylori infection    Past Surgical History  Procedure Laterality Date  . Knee surgery    . Nasal sinus surgery    . Thyroid surgery     No family history on file. History  Substance Use Topics  . Smoking status: Former Research scientist (life sciences)  . Smokeless tobacco: Not on file  . Alcohol Use: No   OB History    No data available     Review of Systems  Respiratory: Positive for cough. Negative for shortness of breath.   Cardiovascular: Negative for chest pain.  All other systems reviewed and are negative.     Allergies  Asa buff (mag; Fish allergy; Other; Peanut-containing drug products; Penicillins; Shellfish-derived products; and Strawberry  Home Medications   Prior to Admission medications   Medication Sig Start Date End Date Taking? Authorizing Provider  acetaminophen (TYLENOL) 500 MG tablet Take 500 mg by mouth daily as needed for mild pain, moderate pain, fever or headache. For pain    Yes Historical Provider, MD  Azelastine HCl (ASTEPRO) 0.15 % SOLN Place 2 sprays into the nose daily.    Yes Historical Provider, MD  budesonide-formoterol (SYMBICORT) 160-4.5 MCG/ACT inhaler Inhale 2 puffs into the lungs daily.   Yes Historical Provider, MD  loratadine (CLARITIN) 10 MG tablet Take 10 mg by mouth daily as needed. For allergy symptoms   Yes Historical Provider, MD  losartan-hydrochlorothiazide (HYZAAR) 100-12.5 MG per tablet Take 1 tablet by mouth daily.   Yes Historical Provider, MD  omeprazole (PRILOSEC) 40 MG capsule Take 40 mg by mouth daily.   Yes Historical Provider, MD  polyethylene glycol (MIRALAX / GLYCOLAX) packet Take 17 g by mouth daily.   Yes Historical Provider, MD  Probiotic Product (PROBIOTIC PO) Take 1 capsule by mouth daily.   Yes Historical Provider, MD  albuterol (PROVENTIL) (2.5 MG/3ML) 0.083% nebulizer solution Take 2.5 mg by nebulization 2 (two) times daily as needed for wheezing or shortness of breath.     Historical Provider, MD  ondansetron (ZOFRAN) 4 MG tablet Take 1 tablet (4 mg total) by mouth every 8 (eight) hours as needed for nausea or vomiting. Patient not taking: Reported on 08/04/2014 02/26/14   Wandra Arthurs, MD  traMADol (ULTRAM) 50 MG tablet Take 1 tablet (50 mg total) by mouth every 6 (six) hours as needed. Patient not taking: Reported on 08/04/2014 02/26/14   Wandra Arthurs, MD   BP 123/70 mmHg  Pulse 65  Temp(Src) 97.8 F (36.6 C) (Oral)  Resp 16  SpO2 100% Physical Exam  Constitutional: She is oriented to person, place, and time. She appears well-developed and well-nourished. No distress.  HENT:  Head: Normocephalic and atraumatic.  Mouth/Throat: Oropharynx is clear and moist.  Eyes: Conjunctivae are normal. Pupils are equal, round, and reactive to light.  Neck: Normal range of motion. Neck supple.  Cardiovascular: Normal rate and regular rhythm.   Pulmonary/Chest: Effort normal and breath sounds normal. No respiratory distress. She has no  wheezes. She has no rales.  Abdominal: Soft. Bowel sounds are increased. There is no tenderness. There is no rebound and no guarding.  Musculoskeletal: Normal range of motion.  Neurological: She is alert and oriented to person, place, and time.  Skin: Skin is dry.  Psychiatric: She has a normal mood and affect.    ED Course  Procedures (including critical care time) Labs Review Labs Reviewed  BASIC METABOLIC PANEL - Abnormal; Notable for the following:    Potassium 2.9 (*)    GFR calc non Af Amer 83 (*)    All other components within normal limits  URINALYSIS, ROUTINE W REFLEX MICROSCOPIC - Abnormal; Notable for the following:    APPearance CLOUDY (*)    Specific Gravity, Urine 1.004 (*)    All other components within normal limits  I-STAT CHEM 8, ED - Abnormal; Notable for the following:    Potassium 3.2 (*)    All other components within normal limits  CBC  I-STAT TROPOININ, ED    Imaging Review Dg Chest 2 View  08/04/2014   CLINICAL DATA:  Acute onset of cough, congestion, shortness of breath and chest pressure. Initial encounter.  EXAM: CHEST  2 VIEW  COMPARISON:  Chest radiograph performed 02/26/2014  FINDINGS: The lungs are well-aerated and clear. There is no evidence of focal opacification, pleural effusion or pneumothorax.  The heart is normal in size; the mediastinal contour is within normal limits. No acute osseous abnormalities are seen.  IMPRESSION: No acute cardiopulmonary process seen.   Electronically Signed   By: Garald Balding M.D.   On: 08/04/2014 01:33     EKG Interpretation None      MDM   Final diagnoses:  None    Medications  traMADol (ULTRAM) tablet 50 mg (50 mg Oral Given 08/04/14 0355)  gi cocktail (Maalox,Lidocaine,Donnatal) (30 mLs Oral Given 08/04/14 0355)   Improved post medications, follow up with Dr. Watt Climes    Veatrice Kells, MD 08/04/14 [phone]

## 2014-08-11 DIAGNOSIS — J45901 Unspecified asthma with (acute) exacerbation: Secondary | ICD-10-CM | POA: Diagnosis not present

## 2014-08-11 DIAGNOSIS — J309 Allergic rhinitis, unspecified: Secondary | ICD-10-CM | POA: Diagnosis not present

## 2014-10-19 DIAGNOSIS — M2550 Pain in unspecified joint: Secondary | ICD-10-CM | POA: Diagnosis not present

## 2014-10-19 DIAGNOSIS — R1012 Left upper quadrant pain: Secondary | ICD-10-CM | POA: Diagnosis not present

## 2014-10-19 DIAGNOSIS — E559 Vitamin D deficiency, unspecified: Secondary | ICD-10-CM | POA: Diagnosis not present

## 2014-10-19 DIAGNOSIS — J45909 Unspecified asthma, uncomplicated: Secondary | ICD-10-CM | POA: Diagnosis not present

## 2014-10-19 DIAGNOSIS — M150 Primary generalized (osteo)arthritis: Secondary | ICD-10-CM | POA: Diagnosis not present

## 2014-10-19 DIAGNOSIS — E780 Pure hypercholesterolemia: Secondary | ICD-10-CM | POA: Diagnosis not present

## 2014-10-19 DIAGNOSIS — I10 Essential (primary) hypertension: Secondary | ICD-10-CM | POA: Diagnosis not present

## 2014-11-25 DIAGNOSIS — I83811 Varicose veins of right lower extremities with pain: Secondary | ICD-10-CM | POA: Diagnosis not present

## 2014-12-14 DIAGNOSIS — I83811 Varicose veins of right lower extremities with pain: Secondary | ICD-10-CM | POA: Diagnosis not present

## 2014-12-30 DIAGNOSIS — M79651 Pain in right thigh: Secondary | ICD-10-CM | POA: Diagnosis not present

## 2014-12-30 DIAGNOSIS — M79604 Pain in right leg: Secondary | ICD-10-CM | POA: Diagnosis not present

## 2015-02-16 DIAGNOSIS — E780 Pure hypercholesterolemia: Secondary | ICD-10-CM | POA: Diagnosis not present

## 2015-02-16 DIAGNOSIS — M150 Primary generalized (osteo)arthritis: Secondary | ICD-10-CM | POA: Diagnosis not present

## 2015-02-16 DIAGNOSIS — J0101 Acute recurrent maxillary sinusitis: Secondary | ICD-10-CM | POA: Diagnosis not present

## 2015-02-16 DIAGNOSIS — I10 Essential (primary) hypertension: Secondary | ICD-10-CM | POA: Diagnosis not present

## 2015-04-09 ENCOUNTER — Inpatient Hospital Stay (HOSPITAL_COMMUNITY)
Admission: EM | Admit: 2015-04-09 | Discharge: 2015-04-24 | DRG: 337 | Disposition: A | Discharge status: Home / Self Care | Payer: Commercial Managed Care - HMO | Attending: Internal Medicine | Admitting: Internal Medicine

## 2015-04-09 ENCOUNTER — Emergency Department (HOSPITAL_COMMUNITY): Payer: Commercial Managed Care - HMO

## 2015-04-09 ENCOUNTER — Encounter (HOSPITAL_COMMUNITY): Payer: Self-pay | Admitting: Emergency Medicine

## 2015-04-09 DIAGNOSIS — Z90710 Acquired absence of both cervix and uterus: Secondary | ICD-10-CM | POA: Diagnosis not present

## 2015-04-09 DIAGNOSIS — Z91018 Allergy to other foods: Secondary | ICD-10-CM

## 2015-04-09 DIAGNOSIS — Z87891 Personal history of nicotine dependence: Secondary | ICD-10-CM | POA: Diagnosis not present

## 2015-04-09 DIAGNOSIS — R1030 Lower abdominal pain, unspecified: Secondary | ICD-10-CM | POA: Diagnosis not present

## 2015-04-09 DIAGNOSIS — G8929 Other chronic pain: Secondary | ICD-10-CM | POA: Diagnosis present

## 2015-04-09 DIAGNOSIS — Z79899 Other long term (current) drug therapy: Secondary | ICD-10-CM | POA: Diagnosis not present

## 2015-04-09 DIAGNOSIS — J449 Chronic obstructive pulmonary disease, unspecified: Secondary | ICD-10-CM | POA: Diagnosis present

## 2015-04-09 DIAGNOSIS — K565 Intestinal adhesions [bands] with obstruction (postprocedural) (postinfection): Secondary | ICD-10-CM | POA: Diagnosis present

## 2015-04-09 DIAGNOSIS — E876 Hypokalemia: Secondary | ICD-10-CM | POA: Diagnosis present

## 2015-04-09 DIAGNOSIS — E162 Hypoglycemia, unspecified: Secondary | ICD-10-CM | POA: Diagnosis present

## 2015-04-09 DIAGNOSIS — Z91010 Allergy to peanuts: Secondary | ICD-10-CM

## 2015-04-09 DIAGNOSIS — Z91013 Allergy to seafood: Secondary | ICD-10-CM

## 2015-04-09 DIAGNOSIS — R109 Unspecified abdominal pain: Secondary | ICD-10-CM | POA: Diagnosis not present

## 2015-04-09 DIAGNOSIS — K5660 Unspecified intestinal obstruction: Secondary | ICD-10-CM | POA: Diagnosis not present

## 2015-04-09 DIAGNOSIS — Z0189 Encounter for other specified special examinations: Secondary | ICD-10-CM

## 2015-04-09 DIAGNOSIS — K219 Gastro-esophageal reflux disease without esophagitis: Secondary | ICD-10-CM | POA: Diagnosis present

## 2015-04-09 DIAGNOSIS — J45909 Unspecified asthma, uncomplicated: Secondary | ICD-10-CM | POA: Diagnosis present

## 2015-04-09 DIAGNOSIS — R1084 Generalized abdominal pain: Secondary | ICD-10-CM | POA: Insufficient documentation

## 2015-04-09 DIAGNOSIS — R140 Abdominal distension (gaseous): Secondary | ICD-10-CM | POA: Diagnosis not present

## 2015-04-09 DIAGNOSIS — R112 Nausea with vomiting, unspecified: Secondary | ICD-10-CM | POA: Diagnosis not present

## 2015-04-09 DIAGNOSIS — I10 Essential (primary) hypertension: Secondary | ICD-10-CM | POA: Diagnosis present

## 2015-04-09 DIAGNOSIS — E440 Moderate protein-calorie malnutrition: Secondary | ICD-10-CM | POA: Diagnosis not present

## 2015-04-09 DIAGNOSIS — K5669 Other intestinal obstruction: Secondary | ICD-10-CM | POA: Diagnosis present

## 2015-04-09 DIAGNOSIS — K598 Other specified functional intestinal disorders: Secondary | ICD-10-CM | POA: Diagnosis not present

## 2015-04-09 DIAGNOSIS — Z4659 Encounter for fitting and adjustment of other gastrointestinal appliance and device: Secondary | ICD-10-CM

## 2015-04-09 DIAGNOSIS — Z4682 Encounter for fitting and adjustment of non-vascular catheter: Secondary | ICD-10-CM | POA: Diagnosis not present

## 2015-04-09 DIAGNOSIS — K56609 Unspecified intestinal obstruction, unspecified as to partial versus complete obstruction: Secondary | ICD-10-CM | POA: Diagnosis present

## 2015-04-09 LAB — URINALYSIS, ROUTINE W REFLEX MICROSCOPIC
Bilirubin Urine: NEGATIVE
Glucose, UA: NEGATIVE mg/dL
Hgb urine dipstick: NEGATIVE
Ketones, ur: 15 mg/dL — AB
Nitrite: NEGATIVE
Protein, ur: NEGATIVE mg/dL
Specific Gravity, Urine: 1.01 (ref 1.005–1.030)
pH: 6 (ref 5.0–8.0)

## 2015-04-09 LAB — COMPREHENSIVE METABOLIC PANEL
ALT: 13 U/L — ABNORMAL LOW (ref 14–54)
AST: 25 U/L (ref 15–41)
Albumin: 3.6 g/dL (ref 3.5–5.0)
Alkaline Phosphatase: 75 U/L (ref 38–126)
Anion gap: 9 (ref 5–15)
BUN: 12 mg/dL (ref 6–20)
CO2: 26 mmol/L (ref 22–32)
Calcium: 9.7 mg/dL (ref 8.9–10.3)
Chloride: 103 mmol/L (ref 101–111)
Creatinine, Ser: 0.9 mg/dL (ref 0.44–1.00)
GFR calc Af Amer: >60 mL/min (ref >60)
GFR calc non Af Amer: >60 mL/min (ref >60)
Glucose, Bld: 134 mg/dL — ABNORMAL HIGH (ref 65–99)
Potassium: 3.3 mmol/L — ABNORMAL LOW (ref 3.5–5.1)
Sodium: 138 mmol/L (ref 135–145)
Total Bilirubin: 0.6 mg/dL (ref 0.3–1.2)
Total Protein: 7.2 g/dL (ref 6.5–8.1)

## 2015-04-09 LAB — CBC
HCT: 41.5 % (ref 36.0–46.0)
Hemoglobin: 14.1 g/dL (ref 12.0–15.0)
MCH: 30.2 pg (ref 26.0–34.0)
MCHC: 34 g/dL (ref 30.0–36.0)
MCV: 88.9 fL (ref 78.0–100.0)
Platelets: 300 10*3/uL (ref 150–400)
RBC: 4.67 MIL/uL (ref 3.87–5.11)
RDW: 13.4 % (ref 11.5–15.5)
WBC: 9.4 10*3/uL (ref 4.0–10.5)

## 2015-04-09 LAB — URINE MICROSCOPIC-ADD ON: RBC / HPF: NONE SEEN RBC/hpf (ref 0–5)

## 2015-04-09 LAB — LIPASE, BLOOD: Lipase: 25 U/L (ref 11–51)

## 2015-04-09 IMAGING — CT CT ABD-PELV W/ CM
2 of 5 series · 10 of 46 positions shown, 11 images · IV contrast (omnipaque)
Comparison: CT [DATE]

CLINICAL DATA: Nausea and vomiting today. Lower abdominal pain.
History of abdominal surgeries.

EXAM:
CT ABDOMEN AND PELVIS WITH CONTRAST
TECHNIQUE: Multidetector CT imaging of the abdomen and pelvis was performed
using the standard protocol following bolus administration of
intravenous contrast.
CONTRAST:  75mL OMNIPAQUE IOHEXOL 300 MG/ML  SOLN

[Series 201: routine, idose (2) · axial · 0.72mm/px · z∈[+72,+407]mm · 7 of 87 slices shown, 8 images]
[im 10/87  soft-tissue]
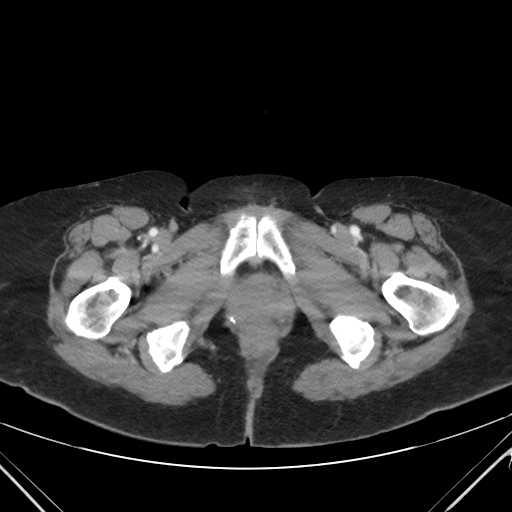
[im 10/87  bone]
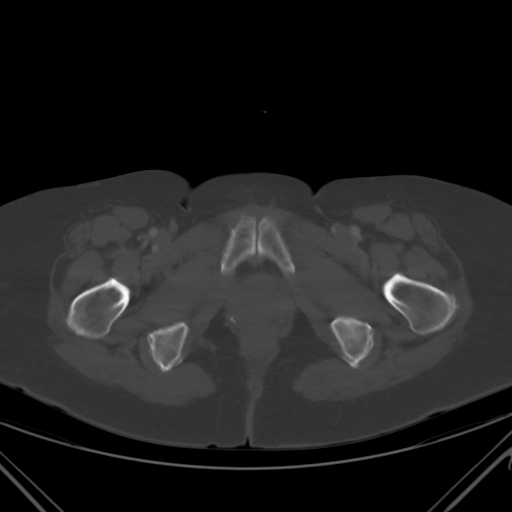
[im 19/87  soft-tissue]
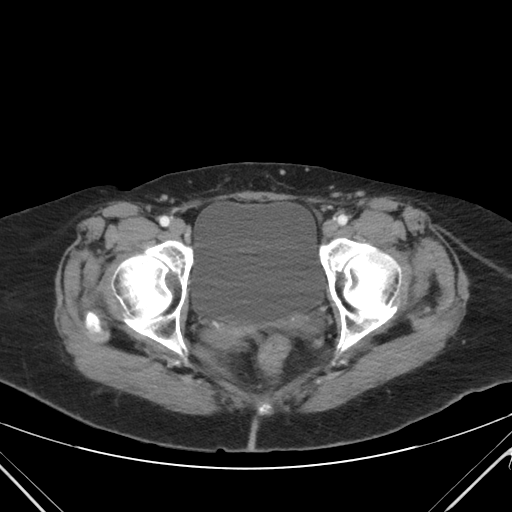
[im 32/87  soft-tissue]
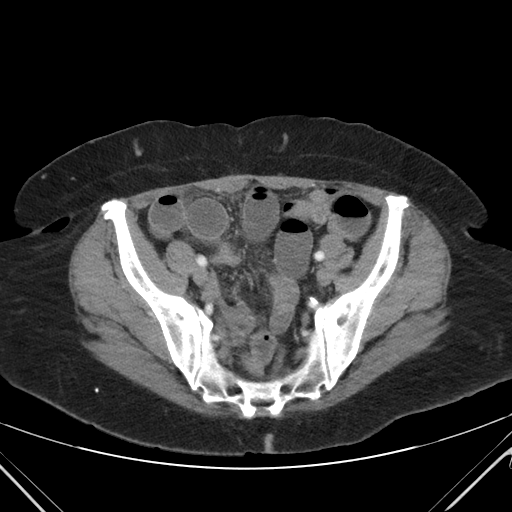
[im 46/87  soft-tissue]
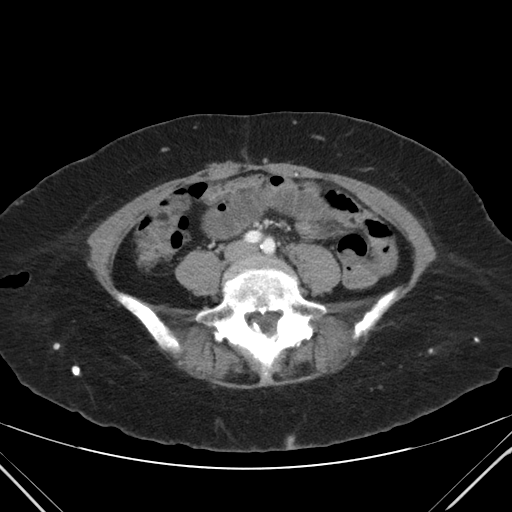
[im 55/87  soft-tissue]
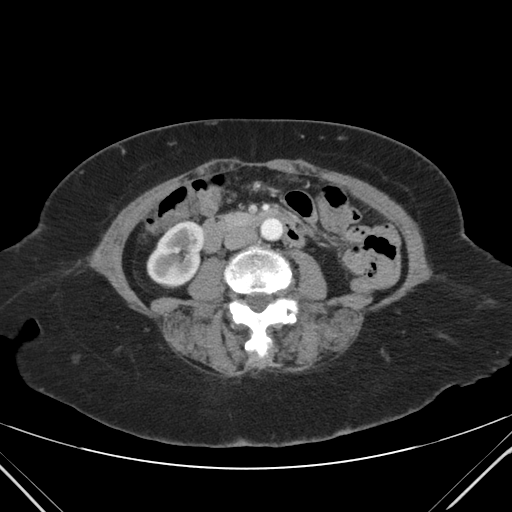
[im 68/87  soft-tissue]
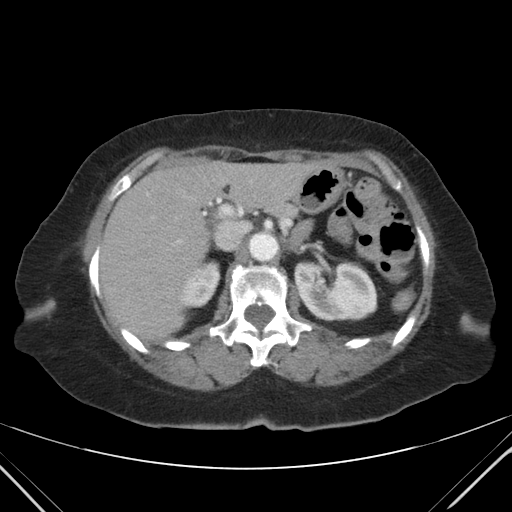
[im 77/87  soft-tissue]
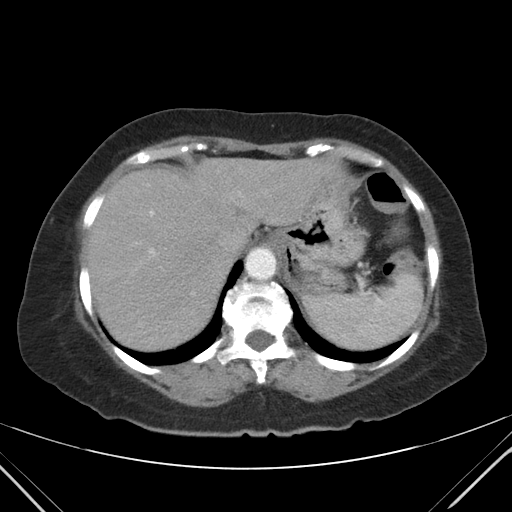

[Series 203: coronals, idose (2) · coronal · 0.45mm/px · 3 of 104 slices shown]
[im 35/104  soft-tissue]
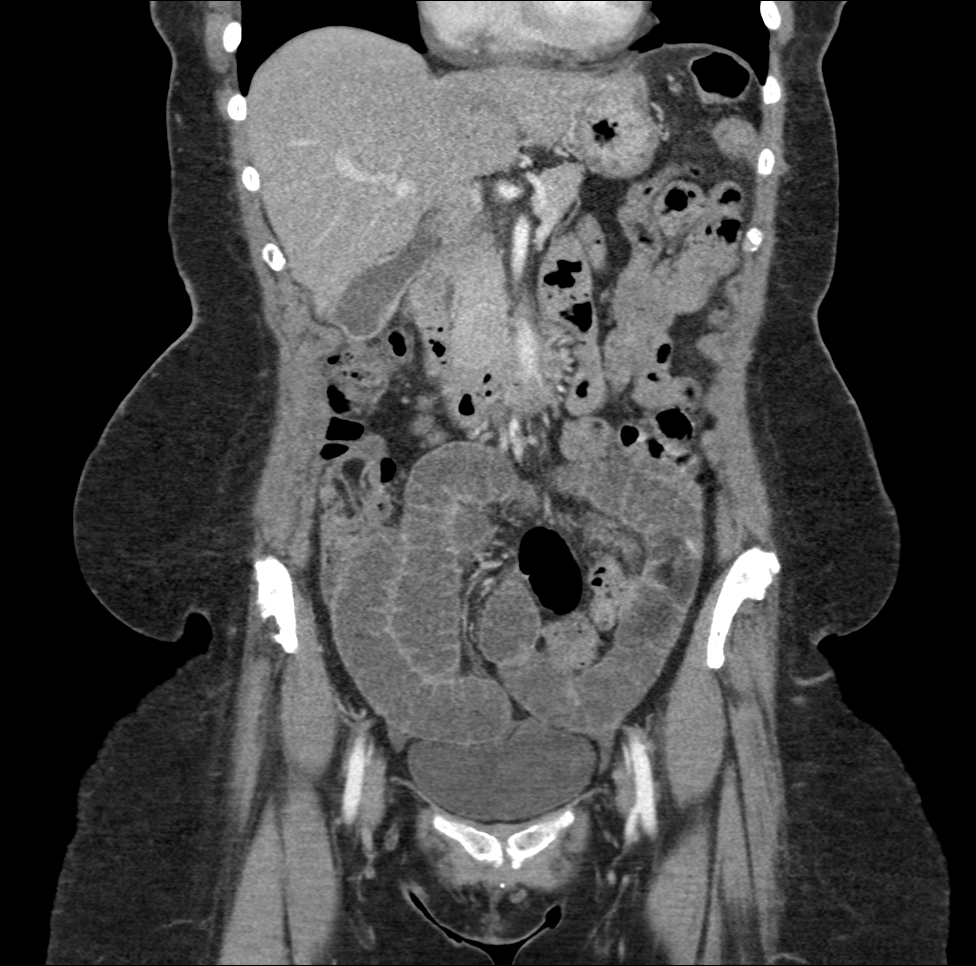
[im 46/104  soft-tissue]
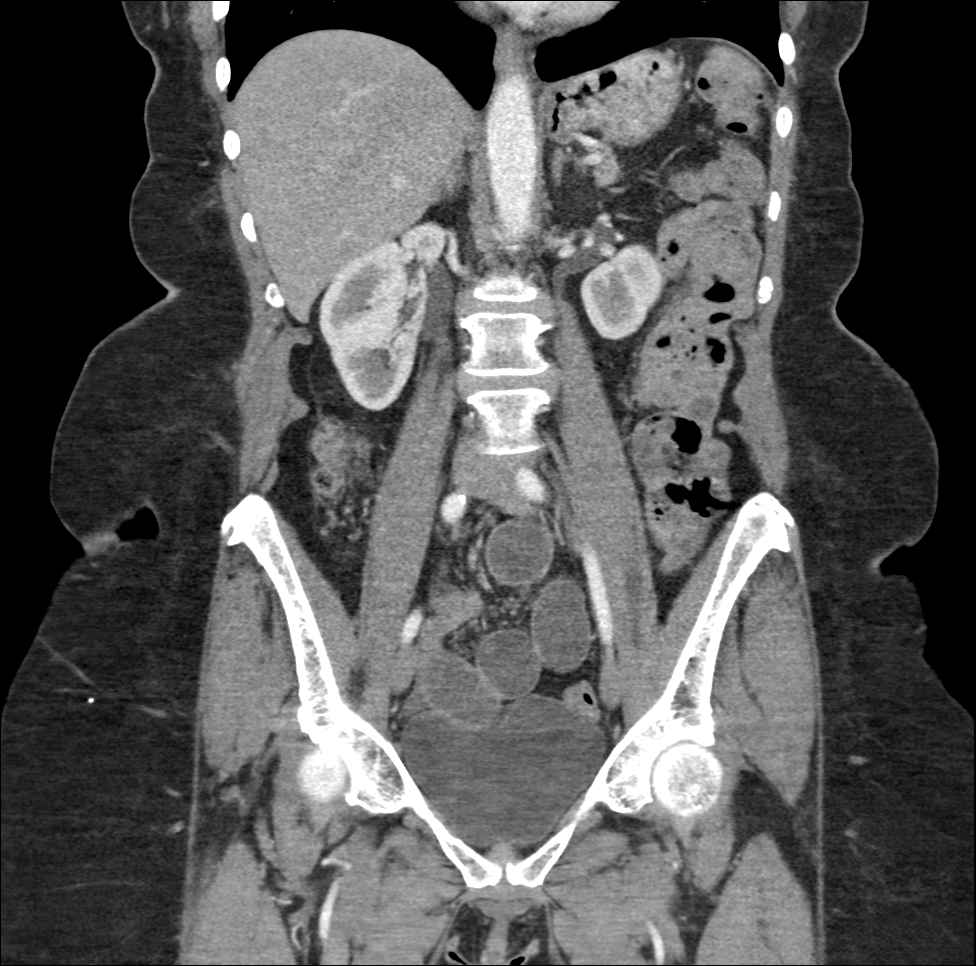
[im 58/104  soft-tissue]
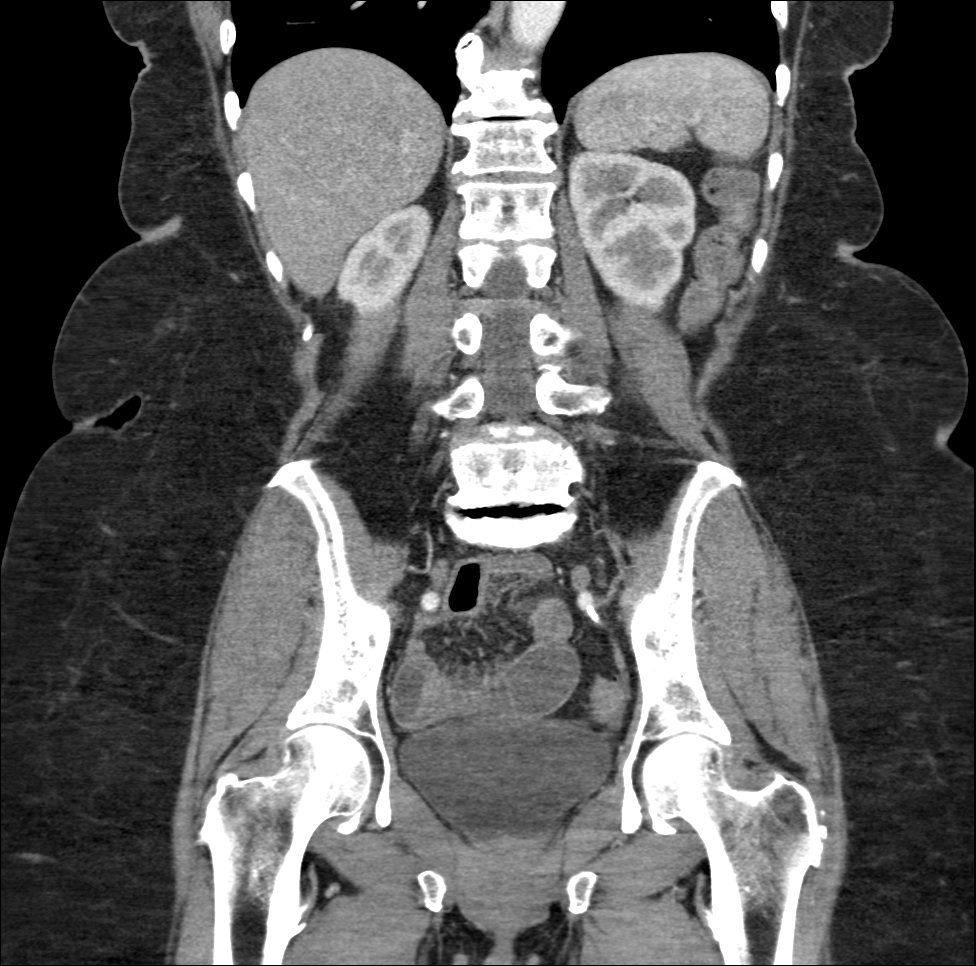

[10 of 46 positions shown; findings below may reference images not displayed]

FINDINGS: Lower chest:  The included lung bases are clear.

Liver: No focal hepatic lesion.

Hepatobiliary: Gallbladder physiologically distended. Small wall
calcification is unchanged, no calcified gallstone. No biliary
dilatation.

Pancreas: No ductal dilatation or inflammation.

Spleen: Normal.

Adrenal glands: No nodule.

Kidneys: Symmetric renal enhancement and excretion. No
hydronephrosis. No perinephric stranding or localizing renal
abnormality.

Stomach/Bowel: The stomach is decompressed. Proximal small bowel
loops are nondistended. There is a moderate length segment of
dilated fluid-filled small bowel loops in the lower abdomen/pelvis.
Transition point seen in the midline lower pelvis image 60 (better
appreciated on sagittal reformats image 90). There is a question of
a more proximal transition point or posteriorly in the lower
abdomen, image 52, however this is not definitive. Mild adjacent
mesenteric edema and small volume of free fluid. No definite bowel
wall thickening. No mesenteric swirling. No pneumatosis. Distal
small bowel loops are decompressed. Small volume of stool throughout
the colon without colonic wall thickening. Portions of the colon are
not well defined given bowel dilatation and lack of enteric
contrast. Appendix is not visualized.

Vascular/Lymphatic: No retroperitoneal adenopathy. Abdominal aorta
is normal in caliber. Mild atherosclerosis without aneurysm.

Reproductive: Uterus presumably absent. No evidence of adnexal mass.

Bladder: Physiologically distended.

Other: No free air free fluid, or intra-abdominal fluid collection.
Scattered soft tissue granulomas.

Musculoskeletal: There are no acute or suspicious osseous
abnormalities. Multilevel degenerative change throughout the spine
with degenerative disc disease and facet arthropathy.
Degenerative-type anterolisthesis of L4 on L5.
IMPRESSION: Small bowel obstruction with transition point in the mid mid lower
pelvis. A more proximal second point is questioned but not
definitive, and closed loop obstruction is not excluded. However
early bowel obstruction is favored.

## 2015-04-09 MED ORDER — SODIUM CHLORIDE 0.9 % IV BOLUS (SEPSIS)
500.0000 mL | Freq: Once | INTRAVENOUS | Status: AC
Start: 2015-04-09 — End: 2015-04-10
  Administered 2015-04-09: 500 mL via INTRAVENOUS

## 2015-04-09 MED ORDER — ONDANSETRON HCL 4 MG/2ML IJ SOLN
4.0000 mg | Freq: Once | INTRAMUSCULAR | Status: AC
  Administered 2015-04-09: 4 mg via INTRAVENOUS
  Filled 2015-04-09: qty 2

## 2015-04-09 MED ORDER — IOHEXOL 300 MG/ML  SOLN
75.0000 mL | Freq: Once | INTRAMUSCULAR | Status: AC | PRN
  Administered 2015-04-09: 75 mL via INTRAVENOUS

## 2015-04-09 NOTE — ED Notes (Signed)
MD at bedside. 

## 2015-04-09 NOTE — ED Provider Notes (Signed)
CSN: [id number]     Arrival date & time 04/09/15  2122 History   First MD Initiated Contact with Patient 04/09/15 2137     Chief Complaint  Patient presents with  . Abdominal Pain  . Emesis   Patient is a 74 y.o. female presenting with abdominal pain. The history is provided by the patient.  Abdominal Pain Pain location:  LLQ Pain quality: cramping   Pain radiates to:  Does not radiate Pain severity:  Moderate Onset quality:  Gradual Duration:  1 week Timing:  Constant Chronicity:  New Relieved by:  Nothing Associated symptoms: constipation and vomiting   Associated symptoms: no chest pain, no dysuria, no fever, no hematuria, no nausea, no shortness of breath and no sore throat     Past Medical History  Diagnosis Date  . Hypertension   . Asthma   . Environmental allergies   . GERD (gastroesophageal reflux disease)   . H. pylori infection    Past Surgical History  Procedure Laterality Date  . Knee surgery    . Nasal sinus surgery    . Thyroid surgery     No family history on file. Social History  Substance Use Topics  . Smoking status: Former Research scientist (life sciences)  . Smokeless tobacco: None  . Alcohol Use: No   OB History    No data available     Review of Systems  Constitutional: Negative for fever.  HENT: Negative for rhinorrhea and sore throat.   Eyes: Negative for visual disturbance.  Respiratory: Negative for chest tightness and shortness of breath.   Cardiovascular: Negative for chest pain and palpitations.  Gastrointestinal: Positive for vomiting, abdominal pain and constipation. Negative for nausea.  Genitourinary: Negative for dysuria and hematuria.  Musculoskeletal: Negative for back pain and neck pain.  Skin: Negative for rash.  Neurological: Negative for dizziness and headaches.  Psychiatric/Behavioral: Negative for confusion.  All other systems reviewed and are negative.   Allergies  Asa buff (mag; Fish allergy; Other; Peanut-containing drug products;  Penicillins; Shellfish-derived products; and Strawberry extract  Home Medications   Prior to Admission medications   Medication Sig Start Date End Date Taking? Authorizing Provider  acetaminophen (TYLENOL) 500 MG tablet Take 500 mg by mouth daily as needed for mild pain, moderate pain, fever or headache. For pain   Yes Historical Provider, MD  albuterol (PROVENTIL) (2.5 MG/3ML) 0.083% nebulizer solution Take 2.5 mg by nebulization 2 (two) times daily as needed for wheezing or shortness of breath.    Yes Historical Provider, MD  Azelastine HCl (ASTEPRO) 0.15 % SOLN Place 2 sprays into the nose daily.    Yes Historical Provider, MD  budesonide-formoterol (SYMBICORT) 160-4.5 MCG/ACT inhaler Inhale 2 puffs into the lungs daily.   Yes Historical Provider, MD  loratadine (CLARITIN) 10 MG tablet Take 10 mg by mouth daily as needed. For allergy symptoms   Yes Historical Provider, MD  losartan-hydrochlorothiazide (HYZAAR) 100-12.5 MG per tablet Take 1 tablet by mouth daily.   Yes Historical Provider, MD  omeprazole (PRILOSEC) 40 MG capsule Take 40 mg by mouth daily.   Yes Historical Provider, MD  Probiotic Product (PROBIOTIC PO) Take 1 capsule by mouth daily.   Yes Historical Provider, MD  ondansetron (ZOFRAN) 4 MG tablet Take 1 tablet (4 mg total) by mouth every 8 (eight) hours as needed for nausea or vomiting. Patient not taking: Reported on 08/04/2014 02/26/14   Wandra Arthurs, MD  sucralfate (CARAFATE) 1 GM/10ML suspension Take 10 mLs (1 g total) by  mouth 4 (four) times daily -  with meals and at bedtime. Patient not taking: Reported on 04/09/2015 08/04/14   April Palumbo, MD  traMADol (ULTRAM) 50 MG tablet Take 1 tablet (50 mg total) by mouth every 6 (six) hours as needed. Patient not taking: Reported on 08/04/2014 02/26/14   Wandra Arthurs, MD   BP 142/87 mmHg  Pulse 77  Temp(Src) 97.9 F (36.6 C) (Oral)  Resp 16  Ht '5\' 5"'$  (1.651 m)  Wt 170 lb (77.111 kg)  BMI 28.29 kg/m2  SpO2 97% Physical Exam   Constitutional: She is oriented to person, place, and time. She appears well-developed and well-nourished. No distress.  HENT:  Head: Normocephalic and atraumatic.  Mouth/Throat: Oropharynx is clear and moist.  Eyes: EOM are normal. Pupils are equal, round, and reactive to light.  Neck: Neck supple. No JVD present.  Cardiovascular: Normal rate, regular rhythm, normal heart sounds and intact distal pulses.  Exam reveals no gallop.   No murmur heard. Pulmonary/Chest: Effort normal and breath sounds normal. She has no wheezes. She has no rales.  Abdominal: Soft. She exhibits no distension. There is generalized tenderness. There is no rigidity, no rebound and no guarding.  Musculoskeletal: Normal range of motion. She exhibits no tenderness.  Neurological: She is alert and oriented to person, place, and time. No cranial nerve deficit. She exhibits normal muscle tone.  Skin: Skin is warm and dry. No rash noted.  Psychiatric: Her behavior is normal.    ED Course  Procedures  None   Labs Review Labs Reviewed  COMPREHENSIVE METABOLIC PANEL - Abnormal; Notable for the following:    Potassium 3.3 (*)    Glucose, Bld 134 (*)    ALT 13 (*)    All other components within normal limits  URINALYSIS, ROUTINE W REFLEX MICROSCOPIC (NOT AT [facility]) - Abnormal; Notable for the following:    APPearance HAZY (*)    Ketones, ur 15 (*)    Leukocytes, UA TRACE (*)    All other components within normal limits  URINE MICROSCOPIC-ADD ON - Abnormal; Notable for the following:    Squamous Epithelial / LPF 0-5 (*)    Bacteria, UA RARE (*)    All other components within normal limits  LIPASE, BLOOD  CBC    Imaging Review Ct Abdomen Pelvis W Contrast  04/10/2015  CLINICAL DATA:  Nausea and vomiting today. Lower abdominal pain. History of abdominal surgeries. EXAM: CT ABDOMEN AND PELVIS WITH CONTRAST TECHNIQUE: Multidetector CT imaging of the abdomen and pelvis was performed using the standard protocol  following bolus administration of intravenous contrast. CONTRAST:  23m OMNIPAQUE IOHEXOL 300 MG/ML  SOLN COMPARISON:  CT 02/17/2014 FINDINGS: Lower chest:  The included lung bases are clear. Liver: No focal hepatic lesion. Hepatobiliary: Gallbladder physiologically distended. Small wall calcification is unchanged, no calcified gallstone. No biliary dilatation. Pancreas: No ductal dilatation or inflammation. Spleen: Normal. Adrenal glands: No nodule. Kidneys: Symmetric renal enhancement and excretion. No hydronephrosis. No perinephric stranding or localizing renal abnormality. Stomach/Bowel: The stomach is decompressed. Proximal small bowel loops are nondistended. There is a moderate length segment of dilated fluid-filled small bowel loops in the lower abdomen/pelvis. Transition point seen in the midline lower pelvis image 60 (better appreciated on sagittal reformats image 90). There is a question of a more proximal transition point or posteriorly in the lower abdomen, image 52, however this is not definitive. Mild adjacent mesenteric edema and small volume of free fluid. No definite bowel wall thickening. No mesenteric  swirling. No pneumatosis. Distal small bowel loops are decompressed. Small volume of stool throughout the colon without colonic wall thickening. Portions of the colon are not well defined given bowel dilatation and lack of enteric contrast. Appendix is not visualized. Vascular/Lymphatic: No retroperitoneal adenopathy. Abdominal aorta is normal in caliber. Mild atherosclerosis without aneurysm. Reproductive: Uterus presumably absent. No evidence of adnexal mass. Bladder: Physiologically distended. Other: No free air free fluid, or intra-abdominal fluid collection. Scattered soft tissue granulomas. Musculoskeletal: There are no acute or suspicious osseous abnormalities. Multilevel degenerative change throughout the spine with degenerative disc disease and facet arthropathy. Degenerative-type  anterolisthesis of L4 on L5. IMPRESSION: Small bowel obstruction with transition point in the mid mid lower pelvis. A more proximal second point is questioned but not definitive, and closed loop obstruction is not excluded. However early bowel obstruction is favored. Electronically Signed   By: Jeb Levering M.D.   On: 04/10/2015 00:15   I have personally reviewed and evaluated these images and lab results as part of my medical decision-making.  MDM   Final diagnoses:  Generalized abdominal pain  SBO (small bowel obstruction) [facility])   Patient is a 74 year old American female with history of GERD, hypertension, H. pylori presents with lower abdominal pain for about one week. Patient states she's been constipated for 3 days and began having nausea and vomiting today. She denies bloody stools or hematemesis. All normal vital signs. Her abdomen is soft with mild diffuse tenderness with no focality. Or Shows no interim for UTI and a normal white blood cell count. We'll obtain CT abdomen and pelvis with contrast to further evaluate.   CT shows SBO with at least one transition point. General surgery consulted. Will admit to hospitalist service for further management. Patient hemodynamically stable for the floor.   Discussed with Dr. Reather Converse.  Gustavus Bryant, MD 04/10/15 3361  Elnora Morrison, MD 04/10/15 [phone]

## 2015-04-09 NOTE — ED Notes (Signed)
Pt. reports intermittent low abdominal pain with nausea and emesis onset this week , denies diarrhea or fever , pt. added chronic constipation , last BM 3 days ago .

## 2015-04-09 NOTE — ED Notes (Signed)
Patient transported to CT 

## 2015-04-10 ENCOUNTER — Encounter (HOSPITAL_COMMUNITY): Payer: Self-pay | Admitting: *Deleted

## 2015-04-10 ENCOUNTER — Inpatient Hospital Stay (HOSPITAL_COMMUNITY): Payer: Commercial Managed Care - HMO

## 2015-04-10 DIAGNOSIS — Z90710 Acquired absence of both cervix and uterus: Secondary | ICD-10-CM | POA: Diagnosis not present

## 2015-04-10 DIAGNOSIS — R140 Abdominal distension (gaseous): Secondary | ICD-10-CM | POA: Diagnosis not present

## 2015-04-10 DIAGNOSIS — E162 Hypoglycemia, unspecified: Secondary | ICD-10-CM | POA: Diagnosis present

## 2015-04-10 DIAGNOSIS — K565 Intestinal adhesions [bands] with obstruction (postprocedural) (postinfection): Secondary | ICD-10-CM | POA: Diagnosis present

## 2015-04-10 DIAGNOSIS — J45909 Unspecified asthma, uncomplicated: Secondary | ICD-10-CM | POA: Diagnosis present

## 2015-04-10 DIAGNOSIS — K5669 Other intestinal obstruction: Secondary | ICD-10-CM | POA: Diagnosis present

## 2015-04-10 DIAGNOSIS — Z79899 Other long term (current) drug therapy: Secondary | ICD-10-CM | POA: Diagnosis not present

## 2015-04-10 DIAGNOSIS — R1084 Generalized abdominal pain: Secondary | ICD-10-CM | POA: Diagnosis not present

## 2015-04-10 DIAGNOSIS — J449 Chronic obstructive pulmonary disease, unspecified: Secondary | ICD-10-CM | POA: Diagnosis present

## 2015-04-10 DIAGNOSIS — K219 Gastro-esophageal reflux disease without esophagitis: Secondary | ICD-10-CM | POA: Diagnosis present

## 2015-04-10 DIAGNOSIS — R112 Nausea with vomiting, unspecified: Secondary | ICD-10-CM | POA: Diagnosis not present

## 2015-04-10 DIAGNOSIS — Z91010 Allergy to peanuts: Secondary | ICD-10-CM | POA: Diagnosis not present

## 2015-04-10 DIAGNOSIS — R1030 Lower abdominal pain, unspecified: Secondary | ICD-10-CM | POA: Diagnosis not present

## 2015-04-10 DIAGNOSIS — Z91018 Allergy to other foods: Secondary | ICD-10-CM | POA: Diagnosis not present

## 2015-04-10 DIAGNOSIS — K56609 Unspecified intestinal obstruction, unspecified as to partial versus complete obstruction: Secondary | ICD-10-CM | POA: Diagnosis present

## 2015-04-10 DIAGNOSIS — G8929 Other chronic pain: Secondary | ICD-10-CM | POA: Diagnosis present

## 2015-04-10 DIAGNOSIS — I10 Essential (primary) hypertension: Secondary | ICD-10-CM | POA: Diagnosis present

## 2015-04-10 DIAGNOSIS — K5660 Unspecified intestinal obstruction: Secondary | ICD-10-CM | POA: Diagnosis not present

## 2015-04-10 DIAGNOSIS — K598 Other specified functional intestinal disorders: Secondary | ICD-10-CM | POA: Diagnosis not present

## 2015-04-10 DIAGNOSIS — Z4682 Encounter for fitting and adjustment of non-vascular catheter: Secondary | ICD-10-CM | POA: Diagnosis not present

## 2015-04-10 DIAGNOSIS — E876 Hypokalemia: Secondary | ICD-10-CM | POA: Diagnosis present

## 2015-04-10 DIAGNOSIS — Z91013 Allergy to seafood: Secondary | ICD-10-CM | POA: Diagnosis not present

## 2015-04-10 DIAGNOSIS — Z87891 Personal history of nicotine dependence: Secondary | ICD-10-CM | POA: Diagnosis not present

## 2015-04-10 DIAGNOSIS — R109 Unspecified abdominal pain: Secondary | ICD-10-CM | POA: Diagnosis not present

## 2015-04-10 DIAGNOSIS — E440 Moderate protein-calorie malnutrition: Secondary | ICD-10-CM | POA: Diagnosis not present

## 2015-04-10 MED ORDER — AZELASTINE HCL 0.1 % NA SOLN
2.0000 | Freq: Every day | NASAL | Status: DC
  Administered 2015-04-10 – 2015-04-24 (×15): 2 via NASAL
  Filled 2015-04-10: qty 30

## 2015-04-10 MED ORDER — MORPHINE SULFATE (PF) 2 MG/ML IV SOLN
2.0000 mg | INTRAVENOUS | Status: DC | PRN
  Administered 2015-04-10 – 2015-04-22 (×30): 2 mg via INTRAVENOUS
  Filled 2015-04-10 (×32): qty 1

## 2015-04-10 MED ORDER — DIATRIZOATE MEGLUMINE & SODIUM 66-10 % PO SOLN
ORAL | Status: AC
  Filled 2015-04-10: qty 90

## 2015-04-10 MED ORDER — PANTOPRAZOLE SODIUM 40 MG PO TBEC
80.0000 mg | DELAYED_RELEASE_TABLET | Freq: Every day | ORAL | Status: DC
  Administered 2015-04-11 – 2015-04-14 (×4): 80 mg via ORAL
  Filled 2015-04-10 (×4): qty 2

## 2015-04-10 MED ORDER — SODIUM CHLORIDE 0.9 % IV SOLN
INTRAVENOUS | Status: DC
  Administered 2015-04-10 – 2015-04-12 (×2): via INTRAVENOUS
  Filled 2015-04-10 (×7): qty 1000

## 2015-04-10 MED ORDER — ONDANSETRON HCL 4 MG PO TABS
4.0000 mg | ORAL_TABLET | Freq: Four times a day (QID) | ORAL | Status: DC | PRN
  Administered 2015-04-23: 4 mg via ORAL
  Filled 2015-04-10: qty 1

## 2015-04-10 MED ORDER — POTASSIUM CHLORIDE 10 MEQ/100ML IV SOLN
10.0000 meq | INTRAVENOUS | Status: AC
  Administered 2015-04-10 (×4): 10 meq via INTRAVENOUS
  Filled 2015-04-10 (×4): qty 100

## 2015-04-10 MED ORDER — DIATRIZOATE MEGLUMINE & SODIUM 66-10 % PO SOLN: 90.0000 mL | Freq: Once | ORAL | Status: DC

## 2015-04-10 MED ORDER — ACETAMINOPHEN 500 MG PO TABS
500.0000 mg | ORAL_TABLET | Freq: Every day | ORAL | Status: DC | PRN
  Administered 2015-04-23 – 2015-04-24 (×4): 500 mg via ORAL
  Filled 2015-04-10 (×5): qty 1

## 2015-04-10 MED ORDER — ONDANSETRON HCL 4 MG/2ML IJ SOLN
4.0000 mg | Freq: Four times a day (QID) | INTRAMUSCULAR | Status: DC | PRN
  Administered 2015-04-10 – 2015-04-15 (×9): 4 mg via INTRAVENOUS
  Filled 2015-04-10 (×9): qty 2

## 2015-04-10 MED ORDER — HEPARIN SODIUM (PORCINE) 5000 UNIT/ML IJ SOLN
5000.0000 [IU] | Freq: Three times a day (TID) | INTRAMUSCULAR | Status: DC
Start: 2015-04-10 — End: 2015-04-18
  Administered 2015-04-10 – 2015-04-18 (×25): 5000 [IU] via SUBCUTANEOUS
  Filled 2015-04-10 (×25): qty 1

## 2015-04-10 MED ORDER — POTASSIUM CHLORIDE 10 MEQ/100ML IV SOLN: 10.0000 meq | INTRAVENOUS | Status: DC

## 2015-04-10 MED ORDER — BUDESONIDE-FORMOTEROL FUMARATE 160-4.5 MCG/ACT IN AERO
2.0000 | INHALATION_SPRAY | Freq: Every day | RESPIRATORY_TRACT | Status: DC
  Administered 2015-04-10 – 2015-04-24 (×14): 2 via RESPIRATORY_TRACT
  Filled 2015-04-10: qty 6

## 2015-04-10 MED ORDER — ALBUTEROL SULFATE (2.5 MG/3ML) 0.083% IN NEBU: 2.5000 mg | INHALATION_SOLUTION | Freq: Two times a day (BID) | RESPIRATORY_TRACT | Status: DC | PRN

## 2015-04-10 MED ORDER — SODIUM CHLORIDE 0.9 % IV SOLN
INTRAVENOUS | Status: DC
  Administered 2015-04-10 (×2): via INTRAVENOUS

## 2015-04-10 MED ORDER — DIATRIZOATE MEGLUMINE & SODIUM 66-10 % PO SOLN
90.0000 mL | Freq: Once | ORAL | Status: AC
  Administered 2015-04-10: 90 mL via ORAL

## 2015-04-10 NOTE — Discharge Instructions (Signed)

## 2015-04-10 NOTE — Consult Note (Signed)
Reason for Consult:Bowel obstruction Referring Physician: Dhungel  Angelica [name] is an 74 y.o. female.  HPI: Chronic abdominal pain followed by Dr. Watt Climes, worsened over the last several days associated with nausea and vomiting x 2.  No BM since Friday.  Pain much better now with control of nausea.  No peritonitis.  She has had a tubal ligation and a hysterectomy through what appears to be a lower midline incision.  Past Medical History  Diagnosis Date  . Hypertension   . Asthma   . Environmental allergies   . GERD (gastroesophageal reflux disease)   . H. pylori infection     Past Surgical History  Procedure Laterality Date  . Knee surgery    . Nasal sinus surgery    . Thyroid surgery      History reviewed. No pertinent family history.  Social History:  reports that she has quit smoking. She does not have any smokeless tobacco history on file. She reports that she does not drink alcohol or use illicit drugs.  Allergies:  Allergies  Allergen Reactions  . Asa Buff (Mag [Buffered Aspirin] Other (See Comments)    Excessive sweating  . Fish Allergy Other (See Comments)    Unknown reaction  . Other Other (See Comments)    Gelcaps; gel-containing capsules; extended release tablets  . Peanut-Containing Drug Products Other (See Comments)    From allergy test  . Penicillins Itching  . Shellfish-Derived Products Other (See Comments)    From allergy test  . Strawberry Extract Other (See Comments)    Worsened allergy symptoms    Medications: I have reviewed the patient's current medications.  Results for orders placed or performed during the hospital encounter of 04/09/15 (from the past 48 hour(s))  Lipase, blood     Status: None   Collection Time: 04/09/15  9:48 PM  Result Value Ref Range   Lipase 25 11 - 51 U/L  Comprehensive metabolic panel     Status: Abnormal   Collection Time: 04/09/15  9:48 PM  Result Value Ref Range   Sodium 138 135 - 145 mmol/L   Potassium 3.3 (L)  3.5 - 5.1 mmol/L   Chloride 103 101 - 111 mmol/L   CO2 26 22 - 32 mmol/L   Glucose, Bld 134 (H) 65 - 99 mg/dL   BUN 12 6 - 20 mg/dL   Creatinine, Ser 0.90 0.44 - 1.00 mg/dL   Calcium 9.7 8.9 - 10.3 mg/dL   Total Protein 7.2 6.5 - 8.1 g/dL   Albumin 3.6 3.5 - 5.0 g/dL   AST 25 15 - 41 U/L   ALT 13 (L) 14 - 54 U/L   Alkaline Phosphatase 75 38 - 126 U/L   Total Bilirubin 0.6 0.3 - 1.2 mg/dL   GFR calc non Af Amer >60 >60 mL/min   GFR calc Af Amer >60 >60 mL/min    Comment: (NOTE) The eGFR has been calculated using the CKD EPI equation. This calculation has not been validated in all clinical situations. eGFR's persistently <60 mL/min signify possible Chronic Kidney Disease.    Anion gap 9 5 - 15  CBC     Status: None   Collection Time: 04/09/15  9:48 PM  Result Value Ref Range   WBC 9.4 4.0 - 10.5 K/uL   RBC 4.67 3.87 - 5.11 MIL/uL   Hemoglobin 14.1 12.0 - 15.0 g/dL   HCT 41.5 36.0 - 46.0 %   MCV 88.9 78.0 - 100.0 fL   MCH 30.2  26.0 - 34.0 pg   MCHC 34.0 30.0 - 36.0 g/dL   RDW 13.4 11.5 - 15.5 %   Platelets 300 150 - 400 K/uL  Urinalysis, Routine w reflex microscopic (not at [facility])     Status: Abnormal   Collection Time: 04/09/15  9:49 PM  Result Value Ref Range   Color, Urine YELLOW YELLOW   APPearance HAZY (A) CLEAR   Specific Gravity, Urine 1.010 1.005 - 1.030   pH 6.0 5.0 - 8.0   Glucose, UA NEGATIVE NEGATIVE mg/dL   Hgb urine dipstick NEGATIVE NEGATIVE   Bilirubin Urine NEGATIVE NEGATIVE   Ketones, ur 15 (A) NEGATIVE mg/dL   Protein, ur NEGATIVE NEGATIVE mg/dL   Nitrite NEGATIVE NEGATIVE   Leukocytes, UA TRACE (A) NEGATIVE  Urine microscopic-add on     Status: Abnormal   Collection Time: 04/09/15  9:49 PM  Result Value Ref Range   Squamous Epithelial / LPF 0-5 (A) NONE SEEN    Comment: Please note change in reference range.   WBC, UA 0-5 0 - 5 WBC/hpf    Comment: Please note change in reference range.   RBC / HPF NONE SEEN 0 - 5 RBC/hpf    Comment: Please note  change in reference range.   Bacteria, UA RARE (A) NONE SEEN    Comment: Please note change in reference range.    Ct Abdomen Pelvis W Contrast  04/10/2015  CLINICAL DATA:  Nausea and vomiting today. Lower abdominal pain. History of abdominal surgeries. EXAM: CT ABDOMEN AND PELVIS WITH CONTRAST TECHNIQUE: Multidetector CT imaging of the abdomen and pelvis was performed using the standard protocol following bolus administration of intravenous contrast. CONTRAST:  75mL OMNIPAQUE IOHEXOL 300 MG/ML  SOLN COMPARISON:  CT 02/17/2014 FINDINGS: Lower chest:  The included lung bases are clear. Liver: No focal hepatic lesion. Hepatobiliary: Gallbladder physiologically distended. Small wall calcification is unchanged, no calcified gallstone. No biliary dilatation. Pancreas: No ductal dilatation or inflammation. Spleen: Normal. Adrenal glands: No nodule. Kidneys: Symmetric renal enhancement and excretion. No hydronephrosis. No perinephric stranding or localizing renal abnormality. Stomach/Bowel: The stomach is decompressed. Proximal small bowel loops are nondistended. There is a moderate length segment of dilated fluid-filled small bowel loops in the lower abdomen/pelvis. Transition point seen in the midline lower pelvis image 60 (better appreciated on sagittal reformats image 90). There is a question of a more proximal transition point or posteriorly in the lower abdomen, image 52, however this is not definitive. Mild adjacent mesenteric edema and small volume of free fluid. No definite bowel wall thickening. No mesenteric swirling. No pneumatosis. Distal small bowel loops are decompressed. Small volume of stool throughout the colon without colonic wall thickening. Portions of the colon are not well defined given bowel dilatation and lack of enteric contrast. Appendix is not visualized. Vascular/Lymphatic: No retroperitoneal adenopathy. Abdominal aorta is normal in caliber. Mild atherosclerosis without aneurysm.  Reproductive: Uterus presumably absent. No evidence of adnexal mass. Bladder: Physiologically distended. Other: No free air free fluid, or intra-abdominal fluid collection. Scattered soft tissue granulomas. Musculoskeletal: There are no acute or suspicious osseous abnormalities. Multilevel degenerative change throughout the spine with degenerative disc disease and facet arthropathy. 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 IMPRESSION: Small bowel obstruction with transition point in the mid mid lower pelvis. A more proximal second point is questioned but not definitive, and closed loop obstruction is not excluded. However early bowel obstruction is favored. Electronically Signed   By: Melanie  Ehinger M.D.   On: 04/10/2015 00:15      Review of Systems  Constitutional: Negative.   HENT: Negative.   Eyes: Negative.   Respiratory: Negative.   Cardiovascular: Negative.   Gastrointestinal: Positive for nausea, vomiting, abdominal pain and constipation.  Skin: Negative.    Blood pressure 106/54, pulse 65, temperature 98.4 F (36.9 C), temperature source Oral, resp. rate 15, height 5' 5" (1.651 m), weight 77.111 kg (170 lb), SpO2 98 %. Physical Exam  Vitals reviewed. Constitutional: She is oriented to person, place, and time. She appears well-developed and well-nourished.  HENT:  Head: Normocephalic and atraumatic.  Eyes: Conjunctivae and EOM are normal. Pupils are equal, round, and reactive to light.  Neck: Normal range of motion. Neck supple.  Cardiovascular: Normal rate, regular rhythm and normal heart sounds.   Respiratory: Effort normal and breath sounds normal.  GI: Soft. Bowel sounds are normal. She exhibits no mass. There is tenderness (minimal tenderness). There is no rebound and no guarding.  Musculoskeletal: Normal range of motion.  Neurological: She is alert and oriented to person, place, and time. She has normal reflexes.  Skin: Skin is warm and dry.  Psychiatric: She has a  normal mood and affect. Her behavior is normal. Judgment and thought content normal.    Assessment/Plan:   Small bowel obstruction per CT, clinically it does not appear to be severe.  No plain X-rays to review.  Will initiate SBO X-ray protocol.  Azaya Goedde 04/10/2015, 5:38 AM

## 2015-04-10 NOTE — Progress Notes (Signed)
Utilization review completed.  

## 2015-04-10 NOTE — Progress Notes (Signed)
[facility] PROGRESS NOTE  Angelica [name] KVQ:[id number] DOB: [birth year] DOA: 04/09/2015 PCP: Foye Spurling, MD  Brief narrative 74 year old female with hypertension, GERD, chronic abdominal pain (sees Dr. Watt Climes), history of hysterectomy presented to the ED with several days of abdominal pain with nausea and vomiting of clear source and constipated since past 2 days. Patient was found to have minimal abdominal tenderness on exam with CT of the abdomen showing small bowel obstruction with transition point in the mid lower pelvis. Admit to medical floor and surgery consulted.     Assessment/Plan: Partial small bowel obstruction Possibly secondary to adhesions from prior surgery. Keep nothing by mouth. IV hydration. Keep K >4. On SBO protocol. CCS following. Abdomen nontender and nondistended and has bowel sounds. Does not need an NG at this time. Also not in active pain but has nausea. Continue supportive care with when necessary pain medications and antiemetics. -Monitor serial abdominal exam and abdominal x-ray.  Hypokalemia Replenish Keep K >4  Essential Hypertension Stable. Monitor on when necessary hydralazine  Diet: Nothing by mouth   DVT prophylaxis: Subcutaneous Lovenox Code Status: Full code Family Communication: Daughter at bedside Disposition Plan: Home  Once improved   Consultants:  CCS  Procedures: CT abdomen and pelvis  Antibiotics:  None  HPI/Subjective: Patient reports abdominal pain earlier this morning dissolve after getting IV morphine. Reports some nausea but no abdominal distention or vomiting. Still has not passed any gas.   Objective: Filed Vitals:   04/10/15 0528 04/10/15 1348  BP: 106/54 100/53  Pulse: 65 65  Temp: 98.4 F (36.9 C) 98.2 F (36.8 C)  Resp: 15 20    Intake/Output Summary (Last 24 hours) at 04/10/15 1352 Last data filed at 04/10/15 1304  Gross per 24 hour  Intake 1183.33 ml  Output    450 ml  Net 733.33 ml    Filed Weights   04/09/15 2131  Weight: 77.111 kg (170 lb)    Exam:   General:  Elderly female not in distress  HEENT: Dry oral mucosa  Chest: Clear to auscultation bilaterally  Serious: Normal S1 and S2,   GI: soft, ND, NT, BS+  Musculoskeletal: Warm, no edema  Data Reviewed: Basic Metabolic Panel:  Recent Labs Lab 04/09/15 2148  NA 138  K 3.3*  CL 103  CO2 26  GLUCOSE 134*  BUN 12  CREATININE 0.90  CALCIUM 9.7   Liver Function Tests:  Recent Labs Lab 04/09/15 2148  AST 25  ALT 13*  ALKPHOS 75  BILITOT 0.6  PROT 7.2  ALBUMIN 3.6    Recent Labs Lab 04/09/15 2148  LIPASE 25   No results for input(s): AMMONIA in the last 168 hours. CBC:  Recent Labs Lab 04/09/15 2148  WBC 9.4  HGB 14.1  HCT 41.5  MCV 88.9  PLT 300   Cardiac Enzymes: No results for input(s): CKTOTAL, CKMB, CKMBINDEX, TROPONINI in the last 168 hours. BNP (last 3 results) No results for input(s): BNP in the last 8760 hours.  ProBNP (last 3 results) No results for input(s): PROBNP in the last 8760 hours.  CBG: No results for input(s): GLUCAP in the last 168 hours.  No results found for this or any previous visit (from the past 240 hour(s)).   Studies: Ct Abdomen Pelvis W Contrast  04/10/2015  CLINICAL DATA:  Nausea and vomiting today. Lower abdominal pain. History of abdominal surgeries. EXAM: CT ABDOMEN AND PELVIS WITH CONTRAST TECHNIQUE: Multidetector CT imaging of the abdomen and pelvis was performed  using the standard protocol following bolus administration of intravenous contrast. CONTRAST:  57m OMNIPAQUE IOHEXOL 300 MG/ML  SOLN COMPARISON:  CT 02/17/2014 FINDINGS: Lower chest:  The included lung bases are clear. Liver: No focal hepatic lesion. Hepatobiliary: Gallbladder physiologically distended. Small wall calcification is unchanged, no calcified gallstone. No biliary dilatation. Pancreas: No ductal dilatation or inflammation. Spleen: Normal. Adrenal glands: No  nodule. Kidneys: Symmetric renal enhancement and excretion. No hydronephrosis. No perinephric stranding or localizing renal abnormality. Stomach/Bowel: The stomach is decompressed. Proximal small bowel loops are nondistended. There is a moderate length segment of dilated fluid-filled small bowel loops in the lower abdomen/pelvis. Transition point seen in the midline lower pelvis image 60 (better appreciated on sagittal reformats image 90). There is a question of a more proximal transition point or posteriorly in the lower abdomen, image 52, however this is not definitive. Mild adjacent mesenteric edema and small volume of free fluid. No definite bowel wall thickening. No mesenteric swirling. No pneumatosis. Distal small bowel loops are decompressed. Small volume of stool throughout the colon without colonic wall thickening. Portions of the colon are not well defined given bowel dilatation and lack of enteric contrast. Appendix is not visualized. Vascular/Lymphatic: No retroperitoneal adenopathy. Abdominal aorta is normal in caliber. Mild atherosclerosis without aneurysm. Reproductive: Uterus presumably absent. No evidence of adnexal mass. Bladder: Physiologically distended. Other: No free air free fluid, or intra-abdominal fluid collection. Scattered soft tissue granulomas. Musculoskeletal: There are no acute or suspicious osseous abnormalities. Multilevel degenerative change throughout the spine with degenerative disc disease and facet arthropathy. 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 IMPRESSION: Small bowel obstruction with transition point in the mid mid lower pelvis. A more proximal second point is questioned but not definitive, and closed loop obstruction is not excluded. However early bowel obstruction is favored. Electronically Signed   By: MJeb LeveringM.D.   On: 04/10/2015 00:15    Scheduled Meds: . azelastine  2 spray Each Nare Daily  . budesonide-formoterol  2 puff Inhalation Daily  .  diatrizoate meglumine-sodium  90 mL Oral Once  . diatrizoate meglumine-sodium      . heparin  5,000 Units Subcutaneous 3 times per day  . pantoprazole  80 mg Oral Daily   Continuous Infusions: . sodium chloride 125 mL/hr at 04/10/15 0856     Time spent: 15 minutes    Jaslynn Thome  [facility] Pager [phone] If 7PM-7AM, please contact night-coverage at [email or web address], password [facility]11/20/2016, 1:52 PM  LOS: 0 days

## 2015-04-10 NOTE — ED Notes (Signed)
Jared MD at bedside. 

## 2015-04-10 NOTE — Progress Notes (Signed)
Patient ID: Angelica [name], female   DOB: 04-03-1941, 74 y.o.   MRN: [id number]    Subjective: Currently denies pain or nausea  Objective: Vital signs in last 24 hours: Temp:  [97.2 F (36.2 C)-98.5 F (36.9 C)] 98.4 F (36.9 C) (11/20 0528) Pulse Rate:  [65-77] 65 (11/20 0528) Resp:  [15-18] 15 (11/20 0528) BP: (99-142)/(54-87) 106/54 mmHg (11/20 0528) SpO2:  [97 %-100 %] 98 % (11/20 0953) Weight:  [77.111 kg (170 lb)] 77.111 kg (170 lb) (11/19 2131) Last BM Date: 04/08/15  Intake/Output from previous day: 11/19 0701 - 11/20 0700 In: 1183.3 [I.V.:1183.3] Out: 450 [Urine:450] Intake/Output this shift:    General appearance: alert, cooperative and no distress GI: normal findings: soft, non-tender and non distended  Lab Results:   Recent Labs  04/09/15 2148  WBC 9.4  HGB 14.1  HCT 41.5  PLT 300   BMET  Recent Labs  04/09/15 2148  NA 138  K 3.3*  CL 103  CO2 26  GLUCOSE 134*  BUN 12  CREATININE 0.90  CALCIUM 9.7     Studies/Results: Ct Abdomen Pelvis W Contrast  04/10/2015  CLINICAL DATA:  Nausea and vomiting today. Lower abdominal pain. History of abdominal surgeries. EXAM: CT ABDOMEN AND PELVIS WITH CONTRAST TECHNIQUE: Multidetector CT imaging of the abdomen and pelvis was performed using the standard protocol following bolus administration of intravenous contrast. CONTRAST:  54m OMNIPAQUE IOHEXOL 300 MG/ML  SOLN COMPARISON:  CT 02/17/2014 FINDINGS: Lower chest:  The included lung bases are clear. Liver: No focal hepatic lesion. Hepatobiliary: Gallbladder physiologically distended. Small wall calcification is unchanged, no calcified gallstone. No biliary dilatation. Pancreas: No ductal dilatation or inflammation. Spleen: Normal. Adrenal glands: No nodule. Kidneys: Symmetric renal enhancement and excretion. No hydronephrosis. No perinephric stranding or localizing renal abnormality. Stomach/Bowel: The stomach is decompressed. Proximal small bowel loops are  nondistended. There is a moderate length segment of dilated fluid-filled small bowel loops in the lower abdomen/pelvis. Transition point seen in the midline lower pelvis image 60 (better appreciated on sagittal reformats image 90). There is a question of a more proximal transition point or posteriorly in the lower abdomen, image 52, however this is not definitive. Mild adjacent mesenteric edema and small volume of free fluid. No definite bowel wall thickening. No mesenteric swirling. No pneumatosis. Distal small bowel loops are decompressed. Small volume of stool throughout the colon without colonic wall thickening. Portions of the colon are not well defined given bowel dilatation and lack of enteric contrast. Appendix is not visualized. Vascular/Lymphatic: No retroperitoneal adenopathy. Abdominal aorta is normal in caliber. Mild atherosclerosis without aneurysm. Reproductive: Uterus presumably absent. No evidence of adnexal mass. Bladder: Physiologically distended. Other: No free air free fluid, or intra-abdominal fluid collection. Scattered soft tissue granulomas. Musculoskeletal: There are no acute or suspicious osseous abnormalities. Multilevel degenerative change throughout the spine with degenerative disc disease and facet arthropathy. 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 IMPRESSION: Small bowel obstruction with transition point in the mid mid lower pelvis. A more proximal second point is questioned but not definitive, and closed loop obstruction is not excluded. However early bowel obstruction is favored. Electronically Signed   By: MJeb LeveringM.D.   On: 04/10/2015 00:15    Anti-infectives: Anti-infectives    None      Assessment/Plan: Apparent SBO.  Exam is unremarkable.  Starting SBO protocol    LOS: 0 days    Jesi Jurgens T 04/10/2015

## 2015-04-10 NOTE — H&P (Signed)
[facility] History and Physical  AMAYA BLAKEMAN RJJ:[id number] DOB: 1940-12-31 DOA: 04/09/2015  Referring physician: EDP PCP: Foye Spurling, MD   Chief Complaint: N/V   HPI: Angelica [name] is a 74 y.o. female who presents to the ED with several day history of abdominal pain.  This worsened and developed nausea and vomiting today.  She has had "constipation" (decreased stool output) during this time.  Pain is worst in the LLQ.  Has been ongoing for 1 week.  She has h/o prior abdominal surgery in the past.  Review of Systems: Systems reviewed.  As above, otherwise negative  Past Medical History  Diagnosis Date  . Hypertension   . Asthma   . Environmental allergies   . GERD (gastroesophageal reflux disease)   . H. pylori infection    Past Surgical History  Procedure Laterality Date  . Knee surgery    . Nasal sinus surgery    . Thyroid surgery     Social History:  reports that she has quit smoking. She does not have any smokeless tobacco history on file. She reports that she does not drink alcohol or use illicit drugs.  Allergies  Allergen Reactions  . Asa Buff (Mag [Buffered Aspirin] Other (See Comments)    Excessive sweating  . Fish Allergy Other (See Comments)    Unknown reaction  . Other Other (See Comments)    Gelcaps; gel-containing capsules; extended release tablets  . Peanut-Containing Drug Products Other (See Comments)    From allergy test  . Penicillins Itching  . Shellfish-Derived Products Other (See Comments)    From allergy test  . Strawberry Extract Other (See Comments)    Worsened allergy symptoms    No family history on file.   Prior to Admission medications   Medication Sig Start Date End Date Taking? Authorizing Provider  acetaminophen (TYLENOL) 500 MG tablet Take 500 mg by mouth daily as needed for mild pain, moderate pain, fever or headache. For pain   Yes Historical Provider, MD  albuterol (PROVENTIL) (2.5 MG/3ML) 0.083% nebulizer  solution Take 2.5 mg by nebulization 2 (two) times daily as needed for wheezing or shortness of breath.    Yes Historical Provider, MD  Azelastine HCl (ASTEPRO) 0.15 % SOLN Place 2 sprays into the nose daily.    Yes Historical Provider, MD  budesonide-formoterol (SYMBICORT) 160-4.5 MCG/ACT inhaler Inhale 2 puffs into the lungs daily.   Yes Historical Provider, MD  loratadine (CLARITIN) 10 MG tablet Take 10 mg by mouth daily as needed. For allergy symptoms   Yes Historical Provider, MD  losartan-hydrochlorothiazide (HYZAAR) 100-12.5 MG per tablet Take 1 tablet by mouth daily.   Yes Historical Provider, MD  omeprazole (PRILOSEC) 40 MG capsule Take 40 mg by mouth daily.   Yes Historical Provider, MD  Probiotic Product (PROBIOTIC PO) Take 1 capsule by mouth daily.   Yes Historical Provider, MD   Physical Exam: Filed Vitals:   04/10/15 0030  BP: 99/55  Pulse: 70  Temp:   Resp:     BP 99/55 mmHg  Pulse 70  Temp(Src) 98.5 F (36.9 C) (Oral)  Resp 18  Ht '5\' 5"'$  (1.651 m)  Wt 77.111 kg (170 lb)  BMI 28.29 kg/m2  SpO2 99%  General Appearance:    Alert, oriented, no distress, appears stated age  Head:    Normocephalic, atraumatic  Eyes:    PERRL, EOMI, sclera non-icteric        Nose:   Nares without drainage or epistaxis. Mucosa,  turbinates normal  Throat:   Moist mucous membranes. Oropharynx without erythema or exudate.  Neck:   Supple. No carotid bruits.  No thyromegaly.  No lymphadenopathy.   Back:     No CVA tenderness, no spinal tenderness  Lungs:     Clear to auscultation bilaterally, without wheezes, rhonchi or rales  Chest wall:    No tenderness to palpitation  Heart:    Regular rate and rhythm without murmurs, gallops, rubs  Abdomen:     Soft, non-tender, nondistended, normal bowel sounds, no organomegaly  Genitalia:    deferred  Rectal:    deferred  Extremities:   No clubbing, cyanosis or edema.  Pulses:   2+ and symmetric all extremities  Skin:   Skin color, texture, turgor  normal, no rashes or lesions  Lymph nodes:   Cervical, supraclavicular, and axillary nodes normal  Neurologic:   CNII-XII intact. Normal strength, sensation and reflexes      throughout    Labs on Admission:  Basic Metabolic Panel:  Recent Labs Lab 04/09/15 2148  NA 138  K 3.3*  CL 103  CO2 26  GLUCOSE 134*  BUN 12  CREATININE 0.90  CALCIUM 9.7   Liver Function Tests:  Recent Labs Lab 04/09/15 2148  AST 25  ALT 13*  ALKPHOS 75  BILITOT 0.6  PROT 7.2  ALBUMIN 3.6    Recent Labs Lab 04/09/15 2148  LIPASE 25   No results for input(s): AMMONIA in the last 168 hours. CBC:  Recent Labs Lab 04/09/15 2148  WBC 9.4  HGB 14.1  HCT 41.5  MCV 88.9  PLT 300   Cardiac Enzymes: No results for input(s): CKTOTAL, CKMB, CKMBINDEX, TROPONINI in the last 168 hours.  BNP (last 3 results) No results for input(s): PROBNP in the last 8760 hours. CBG: No results for input(s): GLUCAP in the last 168 hours.  Radiological Exams on Admission: Ct Abdomen Pelvis W Contrast  04/10/2015  CLINICAL DATA:  Nausea and vomiting today. Lower abdominal pain. History of abdominal surgeries. EXAM: CT ABDOMEN AND PELVIS WITH CONTRAST TECHNIQUE: Multidetector CT imaging of the abdomen and pelvis was performed using the standard protocol following bolus administration of intravenous contrast. CONTRAST:  21m OMNIPAQUE IOHEXOL 300 MG/ML  SOLN COMPARISON:  CT 02/17/2014 FINDINGS: Lower chest:  The included lung bases are clear. Liver: No focal hepatic lesion. Hepatobiliary: Gallbladder physiologically distended. Small wall calcification is unchanged, no calcified gallstone. No biliary dilatation. Pancreas: No ductal dilatation or inflammation. Spleen: Normal. Adrenal glands: No nodule. Kidneys: Symmetric renal enhancement and excretion. No hydronephrosis. No perinephric stranding or localizing renal abnormality. Stomach/Bowel: The stomach is decompressed. Proximal small bowel loops are nondistended.  There is a moderate length segment of dilated fluid-filled small bowel loops in the lower abdomen/pelvis. Transition point seen in the midline lower pelvis image 60 (better appreciated on sagittal reformats image 90). There is a question of a more proximal transition point or posteriorly in the lower abdomen, image 52, however this is not definitive. Mild adjacent mesenteric edema and small volume of free fluid. No definite bowel wall thickening. No mesenteric swirling. No pneumatosis. Distal small bowel loops are decompressed. Small volume of stool throughout the colon without colonic wall thickening. Portions of the colon are not well defined given bowel dilatation and lack of enteric contrast. Appendix is not visualized. Vascular/Lymphatic: No retroperitoneal adenopathy. Abdominal aorta is normal in caliber. Mild atherosclerosis without aneurysm. Reproductive: Uterus presumably absent. No evidence of adnexal mass. Bladder: Physiologically distended. Other: No  free air free fluid, or intra-abdominal fluid collection. Scattered soft tissue granulomas. Musculoskeletal: There are no acute or suspicious osseous abnormalities. Multilevel degenerative change throughout the spine with degenerative disc disease and facet arthropathy. 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 IMPRESSION: Small bowel obstruction with transition point in the mid mid lower pelvis. A more proximal second point is questioned but not definitive, and closed loop obstruction is not excluded. However early bowel obstruction is favored. Electronically Signed   By: Jeb Levering M.D.   On: 04/10/2015 00:15    EKG: Independently reviewed.  Assessment/Plan Principal Problem:   SBO (small bowel obstruction) (HCC) Active Problems:   HTN (hypertension)   1. SBO - 1. EDP spoke with surgery who said admit to med 2. IVF 3. NPO 4. Nausea and pain control 2. HTN - hold Hyzaar, BP on the lower side in the ED currently and hydrating with  IVF.    Code Status: Full  Family Communication: No family in room Disposition Plan: Admit to inpatient   Time spent: 50 min  Virat Prather M. [facility] Pager [phone]  If 7AM-7PM, please contact the day team taking care of the patient [email or web address] Password [facility] 04/10/2015, 12:43 AM

## 2015-04-11 ENCOUNTER — Inpatient Hospital Stay (HOSPITAL_COMMUNITY): Payer: Commercial Managed Care - HMO

## 2015-04-11 DIAGNOSIS — E440 Moderate protein-calorie malnutrition: Secondary | ICD-10-CM | POA: Insufficient documentation

## 2015-04-11 LAB — BASIC METABOLIC PANEL
Anion gap: 12 (ref 5–15)
BUN: 10 mg/dL (ref 6–20)
CO2: 19 mmol/L — ABNORMAL LOW (ref 22–32)
Calcium: 9.1 mg/dL (ref 8.9–10.3)
Chloride: 111 mmol/L (ref 101–111)
Creatinine, Ser: 0.94 mg/dL (ref 0.44–1.00)
GFR calc Af Amer: >60 mL/min (ref >60)
GFR calc non Af Amer: 58 mL/min — ABNORMAL LOW (ref >60)
Glucose, Bld: 64 mg/dL — ABNORMAL LOW (ref 65–99)
Potassium: 4.8 mmol/L (ref 3.5–5.1)
Sodium: 142 mmol/L (ref 135–145)

## 2015-04-11 IMAGING — DX DG ABD PORTABLE 1V
1 series · 1 of 1 positions shown · non-contrast
Comparison: [DATE]

CLINICAL DATA: Small-bowel follow-through, 24 hour delay film

EXAM:
PORTABLE ABDOMEN - 1 VIEW

[abdomen supine]
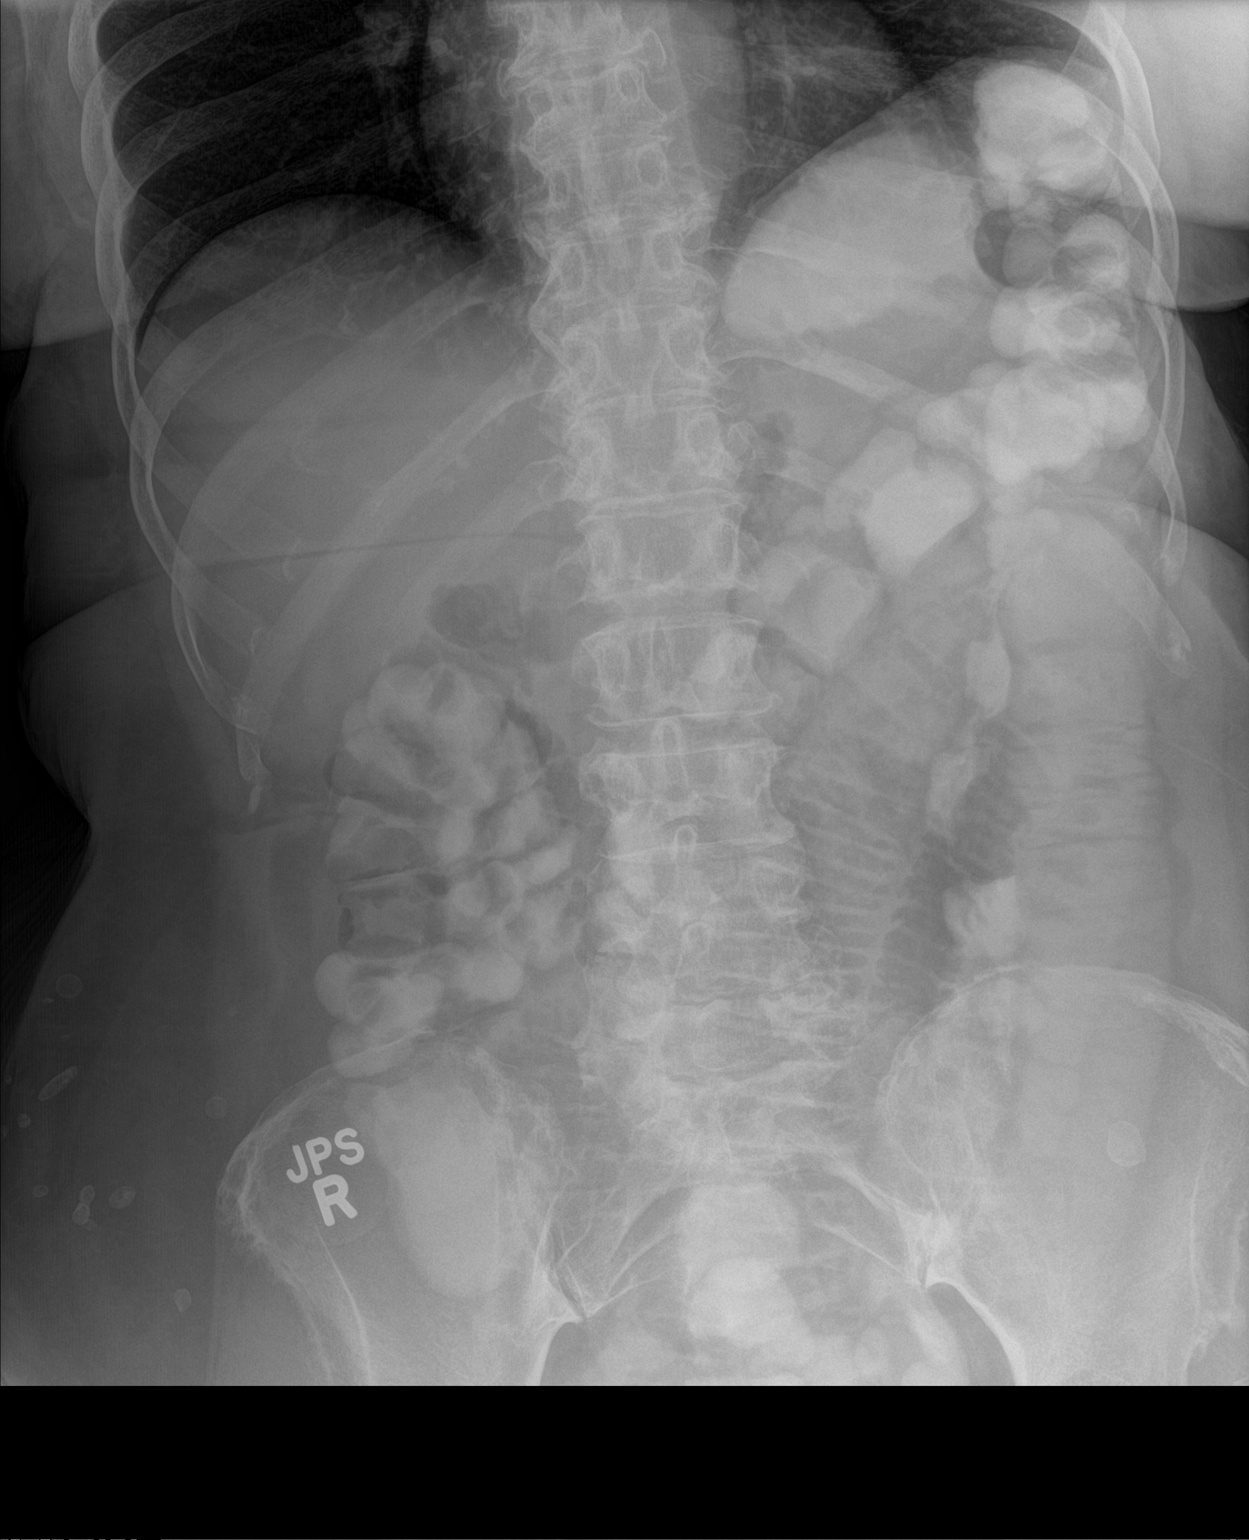

[1 of 1 positions shown; findings below may reference images not displayed]

FINDINGS: Scattered large and small bowel gas is noted. Persistent small bowel
dilatation is seen similar to that noted on the recent CT
examination. Contrast material is noted throughout the colon. These
changes would suggest a partial small bowel obstruction. No free air
is seen. No other focal abnormality is noted.
IMPRESSION: 24 hour delayed film shows contrast material within the colon
indicating a partial small bowel obstruction.

Persistent small bowel dilatation is noted.

## 2015-04-11 MED ORDER — BOOST / RESOURCE BREEZE PO LIQD
1.0000 | Freq: Three times a day (TID) | ORAL | Status: DC
  Administered 2015-04-11 – 2015-04-12 (×2): 1 via ORAL

## 2015-04-11 MED ORDER — DEXTROSE-NACL 5-0.45 % IV SOLN
INTRAVENOUS | Status: AC
  Administered 2015-04-11: 10:00:00 via INTRAVENOUS

## 2015-04-11 NOTE — Progress Notes (Signed)
Patient ID: Angelica [name], female   DOB: [birth year], 74 y.o.   MRN: [id number]     [facility]      [address] [place], [place] [address]    Phone: [phone] FAX: [phone]     Subjective: C/o left sided abd pain.  Had a BM.  No n.v.  No ngt.   Objective:  Vital signs:  Filed Vitals:   04/10/15 1348 04/10/15 2152 04/11/15 0511 04/11/15 0842  BP: 100/53 117/52 115/66   Pulse: 65 66 70   Temp: 98.2 F (36.8 C) 98.2 F (36.8 C) 99.5 F (37.5 C)   TempSrc: Oral Oral Oral   Resp: _0 Height:      Weight:      SpO2: 97% 98% 100% 97%    Last BM Date: 04/11/15  Intake/Output   Yesterday:  11/20 0701 - 11/21 0700 In: 1707.5 [I.V.:707.5; IV Piggyback:300] Out: 450 [Urine:450] This shift:    I/O last 3 completed shifts: In: 2890.8 [I.V.:1890.8; Other:700; IV Piggyback:300] Out: 900 [Urine:900]    Physical Exam: General: Pt awake/alert/oriented x4 in no acute distress Abdomen: Soft.  Nondistended.  Mild tenderness left mid abdomen.  No guarding.  No evidence of peritonitis or hernias.     Problem List:   Principal Problem:   SBO (small bowel obstruction) (HCC) Active Problems:   HTN (hypertension)    Results:   Labs: Results for orders placed or performed during the hospital encounter of 04/09/15 (from the past 48 hour(s))  Lipase, blood     Status: None   Collection Time: 04/09/15  9:48 PM  Result Value Ref Range   Lipase 25 11 - 51 U/L  Comprehensive metabolic panel     Status: Abnormal   Collection Time: 04/09/15  9:48 PM  Result Value Ref Range   Sodium 138 135 - 145 mmol/L   Potassium 3.3 (L) 3.5 - 5.1 mmol/L   Chloride 103 101 - 111 mmol/L   CO2 26 22 - 32 mmol/L   Glucose, Bld 134 (H) 65 - 99 mg/dL   BUN 12 6 - 20 mg/dL   Creatinine, Ser 0.90 0.44 - 1.00 mg/dL   Calcium 9.7 8.9 - 10.3 mg/dL   Total Protein 7.2 6.5 - 8.1 g/dL   Albumin 3.6 3.5 - 5.0 g/dL   AST 25 15 - 41 U/L   ALT  13 (L) 14 - 54 U/L   Alkaline Phosphatase 75 38 - 126 U/L   Total Bilirubin 0.6 0.3 - 1.2 mg/dL   GFR calc non Af Amer >60 >60 mL/min   GFR calc Af Amer >60 >60 mL/min    Comment: (NOTE) The eGFR has been calculated using the CKD EPI equation. This calculation has not been validated in all clinical situations. eGFR's persistently <60 mL/min signify possible Chronic Kidney Disease.    Anion gap 9 5 - 15  CBC     Status: None   Collection Time: 04/09/15  9:48 PM  Result Value Ref Range   WBC 9.4 4.0 - 10.5 K/uL   RBC 4.67 3.87 - 5.11 MIL/uL   Hemoglobin 14.1 12.0 - 15.0 g/dL   HCT 41.5 36.0 - 46.0 %   MCV 88.9 78.0 - 100.0 fL   MCH 30.2 26.0 - 34.0 pg   MCHC 34.0 30.0 - 36.0 g/dL   RDW 13.4 11.5 - 15.5 %   Platelets 300 150 - 400 K/uL  Urinalysis, Routine  w reflex microscopic (not at [facility])     Status: Abnormal   Collection Time: 04/09/15  9:49 PM  Result Value Ref Range   Color, Urine YELLOW YELLOW   APPearance HAZY (A) CLEAR   Specific Gravity, Urine 1.010 1.005 - 1.030   pH 6.0 5.0 - 8.0   Glucose, UA NEGATIVE NEGATIVE mg/dL   Hgb urine dipstick NEGATIVE NEGATIVE   Bilirubin Urine NEGATIVE NEGATIVE   Ketones, ur 15 (A) NEGATIVE mg/dL   Protein, ur NEGATIVE NEGATIVE mg/dL   Nitrite NEGATIVE NEGATIVE   Leukocytes, UA TRACE (A) NEGATIVE  Urine microscopic-add on     Status: Abnormal   Collection Time: 04/09/15  9:49 PM  Result Value Ref Range   Squamous Epithelial / LPF 0-5 (A) NONE SEEN    Comment: Please note change in reference range.   WBC, UA 0-5 0 - 5 WBC/hpf    Comment: Please note change in reference range.   RBC / HPF NONE SEEN 0 - 5 RBC/hpf    Comment: Please note change in reference range.   Bacteria, UA RARE (A) NONE SEEN    Comment: Please note change in reference range.  Basic metabolic panel     Status: Abnormal   Collection Time: 04/11/15  5:32 AM  Result Value Ref Range   Sodium 142 135 - 145 mmol/L   Potassium 4.8 3.5 - 5.1 mmol/L    Comment: DELTA  CHECK NOTED HEMOLYSIS AT THIS LEVEL MAY AFFECT RESULT    Chloride 111 101 - 111 mmol/L   CO2 19 (L) 22 - 32 mmol/L   Glucose, Bld 64 (L) 65 - 99 mg/dL   BUN 10 6 - 20 mg/dL   Creatinine, Ser 0.94 0.44 - 1.00 mg/dL   Calcium 9.1 8.9 - 10.3 mg/dL   GFR calc non Af Amer 58 (L) >60 mL/min   GFR calc Af Amer >60 >60 mL/min    Comment: (NOTE) The eGFR has been calculated using the CKD EPI equation. This calculation has not been validated in all clinical situations. eGFR's persistently <60 mL/min signify possible Chronic Kidney Disease.    Anion gap 12 5 - 15    Imaging / Studies: Ct Abdomen Pelvis W Contrast  04/10/2015  CLINICAL DATA:  Nausea and vomiting today. Lower abdominal pain. History of abdominal surgeries. EXAM: CT ABDOMEN AND PELVIS WITH CONTRAST TECHNIQUE: Multidetector CT imaging of the abdomen and pelvis was performed using the standard protocol following bolus administration of intravenous contrast. CONTRAST:  28m OMNIPAQUE IOHEXOL 300 MG/ML  SOLN COMPARISON:  CT 02/17/2014 FINDINGS: Lower chest:  The included lung bases are clear. Liver: No focal hepatic lesion. Hepatobiliary: Gallbladder physiologically distended. Small wall calcification is unchanged, no calcified gallstone. No biliary dilatation. Pancreas: No ductal dilatation or inflammation. Spleen: Normal. Adrenal glands: No nodule. Kidneys: Symmetric renal enhancement and excretion. No hydronephrosis. No perinephric stranding or localizing renal abnormality. Stomach/Bowel: The stomach is decompressed. Proximal small bowel loops are nondistended. There is a moderate length segment of dilated fluid-filled small bowel loops in the lower abdomen/pelvis. Transition point seen in the midline lower pelvis image 60 (better appreciated on sagittal reformats image 90). There is a question of a more proximal transition point or posteriorly in the lower abdomen, image 52, however this is not definitive. Mild adjacent mesenteric edema and  small volume of free fluid. No definite bowel wall thickening. No mesenteric swirling. No pneumatosis. Distal small bowel loops are decompressed. Small volume of stool throughout the colon without colonic  wall thickening. Portions of the colon are not well defined given bowel dilatation and lack of enteric contrast. Appendix is not visualized. Vascular/Lymphatic: No retroperitoneal adenopathy. Abdominal aorta is normal in caliber. Mild atherosclerosis without aneurysm. Reproductive: Uterus presumably absent. No evidence of adnexal mass. Bladder: Physiologically distended. Other: No free air free fluid, or intra-abdominal fluid collection. Scattered soft tissue granulomas. Musculoskeletal: There are no acute or suspicious osseous abnormalities. Multilevel degenerative change throughout the spine with degenerative disc disease and facet arthropathy. 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 IMPRESSION: Small bowel obstruction with transition point in the mid mid lower pelvis. A more proximal second point is questioned but not definitive, and closed loop obstruction is not excluded. However early bowel obstruction is favored. Electronically Signed   By: Jeb Levering M.D.   On: 04/10/2015 00:15   Dg Abd Portable 1v-small Bowel Obstruction Protocol-24 Hr Delay  04/11/2015  CLINICAL DATA:  Small-bowel follow-through, 24 hour delay film EXAM: PORTABLE ABDOMEN - 1 VIEW COMPARISON:  04/09/2015 FINDINGS: Scattered large and small bowel gas is noted. Persistent small bowel dilatation is seen similar to that noted on the recent CT examination. Contrast material is noted throughout the colon. These changes would suggest a partial small bowel obstruction. No free air is seen. No other focal abnormality is noted. IMPRESSION: 24 hour delayed film shows contrast material within the colon indicating a partial small bowel obstruction. Persistent small bowel dilatation is noted. Electronically Signed   By: Inez Catalina  M.D.   On: 04/11/2015 07:10    Medications / Allergies:  Scheduled Meds: . azelastine  2 spray Each Nare Daily  . budesonide-formoterol  2 puff Inhalation Daily  . heparin  5,000 Units Subcutaneous 3 times per day  . pantoprazole  80 mg Oral Daily   Continuous Infusions: . dextrose 5 % and 0.45% NaCl 50 mL/hr at 04/11/15 0942  . sodium chloride 0.9 % 1,000 mL with potassium chloride 40 mEq infusion 75 mL/hr at 04/10/15 2037   PRN Meds:.acetaminophen, albuterol, morphine injection, ondansetron **OR** ondansetron (ZOFRAN) IV  Antibiotics: Anti-infectives    None        Assessment/Plan pSBO-contrast in colon, benign abdominal exam.  Allow for clears. Mobilize.   Erby Pian, [facility] Pager [phone]) For consults and floor pages call [phone]([id number]:30P)  04/11/2015 11:37 AM

## 2015-04-11 NOTE — Progress Notes (Addendum)
[facility] PROGRESS NOTE  Angelica [name] GUR:[id number] DOB: May 05, 1941 DOA: 04/09/2015 PCP: Foye Spurling, MD  Brief narrative 74 year old female with hypertension, GERD, chronic abdominal pain (sees Dr. Watt Climes), history of hysterectomy presented to the ED with several days of abdominal pain with nausea and vomiting of clear source and constipated since past 2 days. Patient was found to have minimal abdominal tenderness on exam with CT of the abdomen showing small bowel obstruction with transition point in the mid lower pelvis. Admit to medical floor and surgery consulted.     Assessment/Plan: Partial small bowel obstruction Possibly secondary to adhesions from prior surgery. . IV hydration. Keep K >4. On SBO protocol. CCS following. Abdomen nontender and nondistended and has bowel sounds. Having several bowel movements after Gastrografin given yesterday. Started on clears. Continue supportive care with when necessary pain medications and antiemetics. -Monitor serial abdominal exam and abdominal x-ray.  Hypokalemia Replenished Keep K >4  Essential Hypertension Stable. Monitor on when necessary hydralazine  Diet: Clear liquid   DVT prophylaxis: Subcutaneous Lovenox Code Status: Full code Family Communication: Daughter at bedside Disposition Plan: Home possibly in 24-48 hours if continues to improve.   Consultants:  CCS  Procedures: CT abdomen and pelvis  Antibiotics:  None  HPI/Subjective Patient reports mild abdominal pain early this morning.Reports several loose bowel movements after Gastrografin given last evening. Denies nausea or vomiting   Objective: Filed Vitals:   04/10/15 2152 04/11/15 0511  BP: 117/52 115/66  Pulse: 66 70  Temp: 98.2 F (36.8 C) 99.5 F (37.5 C)  Resp: 15 15    Intake/Output Summary (Last 24 hours) at 04/11/15 1351 Last data filed at 04/11/15 1015  Gross per 24 hour  Intake 1707.5 ml  Output    150 ml  Net 1557.5 ml    Filed Weights   04/09/15 2131  Weight: 77.111 kg (170 lb)    Exam:   General: not in distress  HEENT: Dry oral mucosa  Chest: Clear to auscultation bilaterally  Serious: Normal S1 and S2,   GI: soft, ND, NT, BS+  Musculoskeletal: Warm, no edema  Data Reviewed: Basic Metabolic Panel:  Recent Labs Lab 04/09/15 2148 04/11/15 0532  NA 138 142  K 3.3* 4.8  CL 103 111  CO2 26 19*  GLUCOSE 134* 64*  BUN 12 10  CREATININE 0.90 0.94  CALCIUM 9.7 9.1   Liver Function Tests:  Recent Labs Lab 04/09/15 2148  AST 25  ALT 13*  ALKPHOS 75  BILITOT 0.6  PROT 7.2  ALBUMIN 3.6    Recent Labs Lab 04/09/15 2148  LIPASE 25   No results for input(s): AMMONIA in the last 168 hours. CBC:  Recent Labs Lab 04/09/15 2148  WBC 9.4  HGB 14.1  HCT 41.5  MCV 88.9  PLT 300   Cardiac Enzymes: No results for input(s): CKTOTAL, CKMB, CKMBINDEX, TROPONINI in the last 168 hours. BNP (last 3 results) No results for input(s): BNP in the last 8760 hours.  ProBNP (last 3 results) No results for input(s): PROBNP in the last 8760 hours.  CBG: No results for input(s): GLUCAP in the last 168 hours.  No results found for this or any previous visit (from the past 240 hour(s)).   Studies: Ct Abdomen Pelvis W Contrast  04/10/2015  CLINICAL DATA:  Nausea and vomiting today. Lower abdominal pain. History of abdominal surgeries. EXAM: CT ABDOMEN AND PELVIS WITH CONTRAST TECHNIQUE: Multidetector CT imaging of the abdomen and pelvis was performed using the standard  protocol following bolus administration of intravenous contrast. CONTRAST:  45m OMNIPAQUE IOHEXOL 300 MG/ML  SOLN COMPARISON:  CT 02/17/2014 FINDINGS: Lower chest:  The included lung bases are clear. Liver: No focal hepatic lesion. Hepatobiliary: Gallbladder physiologically distended. Small wall calcification is unchanged, no calcified gallstone. No biliary dilatation. Pancreas: No ductal dilatation or inflammation. Spleen:  Normal. Adrenal glands: No nodule. Kidneys: Symmetric renal enhancement and excretion. No hydronephrosis. No perinephric stranding or localizing renal abnormality. Stomach/Bowel: The stomach is decompressed. Proximal small bowel loops are nondistended. There is a moderate length segment of dilated fluid-filled small bowel loops in the lower abdomen/pelvis. Transition point seen in the midline lower pelvis image 60 (better appreciated on sagittal reformats image 90). There is a question of a more proximal transition point or posteriorly in the lower abdomen, image 52, however this is not definitive. Mild adjacent mesenteric edema and small volume of free fluid. No definite bowel wall thickening. No mesenteric swirling. No pneumatosis. Distal small bowel loops are decompressed. Small volume of stool throughout the colon without colonic wall thickening. Portions of the colon are not well defined given bowel dilatation and lack of enteric contrast. Appendix is not visualized. Vascular/Lymphatic: No retroperitoneal adenopathy. Abdominal aorta is normal in caliber. Mild atherosclerosis without aneurysm. Reproductive: Uterus presumably absent. No evidence of adnexal mass. Bladder: Physiologically distended. Other: No free air free fluid, or intra-abdominal fluid collection. Scattered soft tissue granulomas. Musculoskeletal: There are no acute or suspicious osseous abnormalities. Multilevel degenerative change throughout the spine with degenerative disc disease and facet arthropathy. 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 IMPRESSION: Small bowel obstruction with transition point in the mid mid lower pelvis. A more proximal second point is questioned but not definitive, and closed loop obstruction is not excluded. However early bowel obstruction is favored. Electronically Signed   By: MJeb LeveringM.D.   On: 04/10/2015 00:15   Dg Abd Portable 1v-small Bowel Obstruction Protocol-24 Hr Delay  04/11/2015   CLINICAL DATA:  Small-bowel follow-through, 24 hour delay film EXAM: PORTABLE ABDOMEN - 1 VIEW COMPARISON:  04/09/2015 FINDINGS: Scattered large and small bowel gas is noted. Persistent small bowel dilatation is seen similar to that noted on the recent CT examination. Contrast material is noted throughout the colon. These changes would suggest a partial small bowel obstruction. No free air is seen. No other focal abnormality is noted. IMPRESSION: 24 hour delayed film shows contrast material within the colon indicating a partial small bowel obstruction. Persistent small bowel dilatation is noted. Electronically Signed   By: MInez CatalinaM.D.   On: 04/11/2015 07:10    Scheduled Meds: . azelastine  2 spray Each Nare Daily  . budesonide-formoterol  2 puff Inhalation Daily  . heparin  5,000 Units Subcutaneous 3 times per day  . pantoprazole  80 mg Oral Daily   Continuous Infusions: . dextrose 5 % and 0.45% NaCl 50 mL/hr at 04/11/15 0942  . sodium chloride 0.9 % 1,000 mL with potassium chloride 40 mEq infusion 75 mL/hr at 04/10/15 2037     Time spent: 120 minutes    Ivey Cina  [facility] Pager [phone] If 7PM-7AM, please contact night-coverage at [email or web address], password [email or web address]11/21/2016, 1:51 PM  LOS: 1 day

## 2015-04-11 NOTE — Progress Notes (Signed)
Initial Nutrition Assessment  DOCUMENTATION CODES:   Non-severe (moderate) malnutrition in context of acute illness/injury  INTERVENTION:   -Boost Breeze po TID, each supplement provides 250 kcal and 9 grams of protein  NUTRITION DIAGNOSIS:   Malnutrition related to acute illness as evidenced by percent weight loss, mild depletion of body fat, mild depletion of muscle mass.  GOAL:   Patient will meet greater than or equal to 90% of their needs  MONITOR:   PO intake, Supplement acceptance, Diet advancement, Labs, Weight trends, Skin, I & O's  REASON FOR ASSESSMENT:   Malnutrition Screening Tool    ASSESSMENT:   74 year old female with hypertension, GERD, chronic abdominal pain (sees Dr. Watt Climes), history of hysterectomy presented to the ED with several days of abdominal pain with nausea and vomiting of clear source and constipated since past 2 days. Patient was found to have minimal abdominal tenderness on exam with CT of the abdomen showing small bowel obstruction with transition point in the mid lower pelvis. Admit to medical floor and surgery consulted.  Pt admitted with SBO.   Hx obtained by pt at bedside. She reports she has struggled with "stomach problems for years". She reports a general decline in health over the past 3-4 weeks, including weight loss, poor appetite, and abdominal pain. She had been eating a soft diet PTA in attempt to control abdominal pain, but intake has been poor.   She reports UBW of 180#, which she has been able to maintain until the past few weeks, which is consistent with documented wt hx. Noted a 6% wt loss over the past month, which is significant.   Pt reports that she is anxious to try clear liquids. Discussed rationale for diet order and allowed foods on clear liquid diet. Reinforced importance of good PO intake to promote healing. Will add Boost Breeze supplement to optimize nutritional status.   Nutrition-Focused physical exam completed.  Findings are mild to moderate fat depletion, mild to moderate muscle depletion, and no edema.   Labs reviewed.   Diet Order:  Diet clear liquid Room service appropriate?: Yes; Fluid consistency:: Thin  Skin:  Reviewed, no issues  Last BM:  04/11/15  Height:   Ht Readings from Last 1 Encounters:  04/09/15 '5\' 5"'$  (1.651 m)    Weight:   Wt Readings from Last 1 Encounters:  04/09/15 170 lb (77.111 kg)    Ideal Body Weight:  56.8 kg  BMI:  Body mass index is 28.29 kg/(m^2).  Estimated Nutritional Needs:   Kcal:  1700-1900  Protein:  75-90 grams  Fluid:  1.7-1.9 L  EDUCATION NEEDS:   Education needs addressed  Lukas Pelcher A. Jimmye Norman, RD, LDN, CDE Pager: [phone] After hours Pager: [phone]

## 2015-04-12 ENCOUNTER — Inpatient Hospital Stay (HOSPITAL_COMMUNITY): Payer: Commercial Managed Care - HMO

## 2015-04-12 DIAGNOSIS — R109 Unspecified abdominal pain: Secondary | ICD-10-CM | POA: Insufficient documentation

## 2015-04-12 DIAGNOSIS — R1030 Lower abdominal pain, unspecified: Secondary | ICD-10-CM

## 2015-04-12 IMAGING — CR DG ABD PORTABLE 1V
1 series · 1 of 1 positions shown · non-contrast
Comparison: [DATE]

CLINICAL DATA: Abdominal pain, follow up small bowel obstruction

EXAM:
PORTABLE ABDOMEN - 1 VIEW

[supine ap]
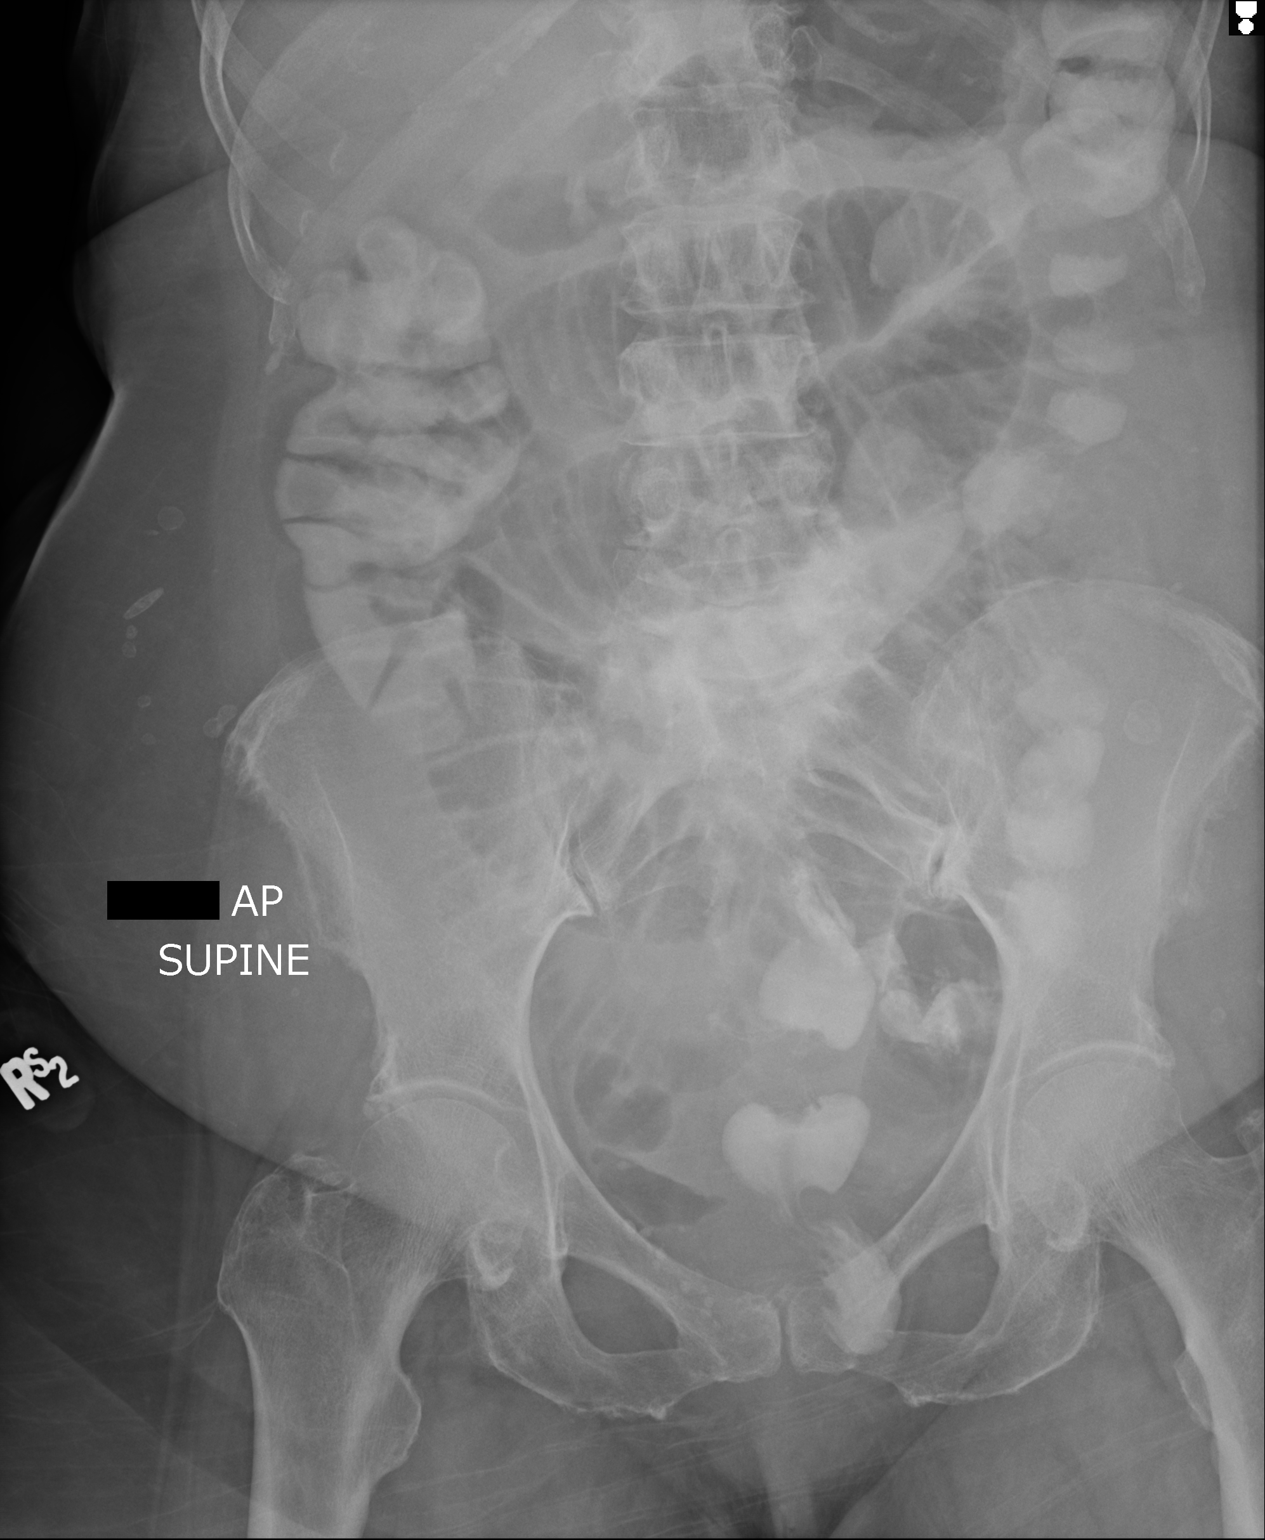

[1 of 1 positions shown; findings below may reference images not displayed]

FINDINGS: Persistent small bowel dilatation is noted. Contrast material is
noted throughout the colon from previous administration. No new
focal abnormality is seen. No free air is noted.
IMPRESSION: Stable small bowel dilatation.

## 2015-04-12 MED ORDER — METHOCARBAMOL 500 MG PO TABS: 1000.0000 mg | ORAL_TABLET | Freq: Three times a day (TID) | ORAL | Status: DC | PRN

## 2015-04-12 MED ORDER — METOCLOPRAMIDE HCL 5 MG/ML IJ SOLN
10.0000 mg | Freq: Three times a day (TID) | INTRAMUSCULAR | Status: DC
  Administered 2015-04-12 – 2015-04-23 (×32): 10 mg via INTRAVENOUS
  Filled 2015-04-12 (×32): qty 2

## 2015-04-12 MED ORDER — BISACODYL 10 MG RE SUPP
10.0000 mg | Freq: Every day | RECTAL | Status: DC
  Administered 2015-04-12 – 2015-04-17 (×6): 10 mg via RECTAL
  Filled 2015-04-12 (×6): qty 1

## 2015-04-12 NOTE — Progress Notes (Signed)
[facility] PROGRESS NOTE  Angelica [name] PZW:[id number] DOB: 02/10/1941 DOA: 04/09/2015 PCP: Foye Spurling, MD  Brief narrative 74 year old female with hypertension, GERD, chronic abdominal pain (sees Dr. Watt Climes), history of hysterectomy presented to the ED with several days of abdominal pain with nausea and vomiting of clear source and constipated since past 2 days. Patient was found to have minimal abdominal tenderness on exam with CT of the abdomen showing small bowel obstruction with transition point in the mid lower pelvis. Admit to medical floor and surgery consulted.     Assessment/Plan: Partial small bowel obstruction Possibly secondary to adhesions from prior surgery. . IV hydration. Keep K >4. On SBO protocol. CCS following. Abdomen nontender and nondistended and has bowel sounds. Having abdominal cramps after gastrografin . Started on clears. Continue supportive care with when necessary pain medications and antiemetics. Will add scheduled reglan and colace. -abdominal xray still showing bowel dilatation. Repeat in am.  -encourage ambulation.   Hypokalemia Replenished Keep K >4  Essential Hypertension Stable. Monitor on when necessary hydralazine  Diet: Clear liquid   DVT prophylaxis: Subcutaneous Lovenox Code Status: Full code Family Communication: none at bedside Disposition Plan: Home possibly in 24-48 hours if continues to improve.   Consultants:  CCS  Procedures: CT abdomen and pelvis  Antibiotics:  None  HPI/Subjective Having abdominal cramps all night . C/o nausea.   Objective: Filed Vitals:   04/11/15 2320 04/12/15 0637  BP: 112/61 107/60  Pulse: 67 67  Temp: 98.7 F (37.1 C) 98.5 F (36.9 C)  Resp: 16 16    Intake/Output Summary (Last 24 hours) at 04/12/15 1245 Last data filed at 04/12/15 1019  Gross per 24 hour  Intake    360 ml  Output    400 ml  Net    -40 ml   Filed Weights   04/09/15 2131  Weight: 77.111 kg (170 lb)     Exam:   General: not in distress  HEENT: Dry oral mucosa  Chest: Clear to auscultation bilaterally  Serious: Normal S1 and S2,   GI: soft, ND, NT, BS+  Musculoskeletal: Warm, no edema  Data Reviewed: Basic Metabolic Panel:  Recent Labs Lab 04/09/15 2148 04/11/15 0532  NA 138 142  K 3.3* 4.8  CL 103 111  CO2 26 19*  GLUCOSE 134* 64*  BUN 12 10  CREATININE 0.90 0.94  CALCIUM 9.7 9.1   Liver Function Tests:  Recent Labs Lab 04/09/15 2148  AST 25  ALT 13*  ALKPHOS 75  BILITOT 0.6  PROT 7.2  ALBUMIN 3.6    Recent Labs Lab 04/09/15 2148  LIPASE 25   No results for input(s): AMMONIA in the last 168 hours. CBC:  Recent Labs Lab 04/09/15 2148  WBC 9.4  HGB 14.1  HCT 41.5  MCV 88.9  PLT 300   Cardiac Enzymes: No results for input(s): CKTOTAL, CKMB, CKMBINDEX, TROPONINI in the last 168 hours. BNP (last 3 results) No results for input(s): BNP in the last 8760 hours.  ProBNP (last 3 results) No results for input(s): PROBNP in the last 8760 hours.  CBG: No results for input(s): GLUCAP in the last 168 hours.  No results found for this or any previous visit (from the past 240 hour(s)).   Studies: Dg Abd Portable 1v  04/12/2015  CLINICAL DATA:  Abdominal pain, follow up small bowel obstruction EXAM: PORTABLE ABDOMEN - 1 VIEW COMPARISON:  04/11/2015 FINDINGS: Persistent small bowel dilatation is noted. Contrast material is noted throughout the colon  from previous administration. No new focal abnormality is seen. No free air is noted. IMPRESSION: Stable small bowel dilatation. Electronically Signed   By: Inez Catalina M.D.   On: 04/12/2015 09:43   Dg Abd Portable 1v-small Bowel Obstruction Protocol-24 Hr Delay  04/11/2015  CLINICAL DATA:  Small-bowel follow-through, 24 hour delay film EXAM: PORTABLE ABDOMEN - 1 VIEW COMPARISON:  04/09/2015 FINDINGS: Scattered large and small bowel gas is noted. Persistent small bowel dilatation is seen similar to  that noted on the recent CT examination. Contrast material is noted throughout the colon. These changes would suggest a partial small bowel obstruction. No free air is seen. No other focal abnormality is noted. IMPRESSION: 24 hour delayed film shows contrast material within the colon indicating a partial small bowel obstruction. Persistent small bowel dilatation is noted. Electronically Signed   By: Inez Catalina M.D.   On: 04/11/2015 07:10    Scheduled Meds: . azelastine  2 spray Each Nare Daily  . bisacodyl  10 mg Rectal Daily  . budesonide-formoterol  2 puff Inhalation Daily  . feeding supplement  1 Container Oral TID BM  . heparin  5,000 Units Subcutaneous 3 times per day  . pantoprazole  80 mg Oral Daily      Time spent: 25 minutes    Suraya Vidrine, [place]  [facility] Pager [phone]. If 7PM-7AM, please contact night-coverage at [email or web address], password [facility] 04/12/2015, 12:45 PM  LOS: 2 days

## 2015-04-12 NOTE — Care Management Note (Signed)
Case Management Note  Patient Details  Name: TIFFANYE HARTMANN MRN: [id number] Date of Birth: Oct 05, 1940  Subjective/Objective:    Date: 04/12/15 Spoke with patient at the bedside.  Introduced self as Tourist information centre manager and explained role in discharge planning and how to be reached.  Verified patient lives in town, alone with daughter who works from home.  Expressed potential need for no other DME.  Verified patient anticipates to go home with family, at time of discharge and will have full-time  supervision by family at this time to best of their knowledge.  Patient denied needing help with their medication.  Patient drives to MD appointments.  Verified patient has PCP Jeanann Lewandowsky.   Plan: CM will continue to follow for discharge planning and [facility] resources.-                 Action/Plan:   Expected Discharge Date:  04/12/15               Expected Discharge Plan:  Home/Self Care  In-House Referral:     Discharge planning Services  CM Consult  Post Acute Care Choice:    Choice offered to:     DME Arranged:    DME Agency:     HH Arranged:    HH Agency:     Status of Service:  Completed, signed off  Medicare Important Message Given:    Date Medicare IM Given:    Medicare IM give by:    Date Additional Medicare IM Given:    Additional Medicare Important Message give by:     If discussed at [place] of Stay Meetings, dates discussed:    Additional Comments:  Zenon Mayo, RN 04/12/2015, 3:22 PM

## 2015-04-12 NOTE — Progress Notes (Signed)
[facility] Progress Note     Subjective: Pt feels bloated, had pain earlier this am.  Not passing much gas, feels like she needs to, Had several BM's after contrast given.  Wants to ambulate in halls.  Up to chair this am.  No NG in place currently.    Objective: Vital signs in last 24 hours: Temp:  [98.1 F (36.7 C)-98.7 F (37.1 C)] 98.5 F (36.9 C) (11/22 0637) Pulse Rate:  [67-73] 67 (11/22 0637) Resp:  [16-18] 16 (11/22 0637) BP: (107-131)/(60-61) 107/60 mmHg (11/22 0637) SpO2:  [97 %-100 %] 98 % (11/22 0751) Last BM Date: 04/11/15  Intake/Output from previous day: 11/21 0701 - 11/22 0700 In: 0  Out: 200 [Urine:200] Intake/Output this shift: Total I/O In: 360 [P.O.:360] Out: 200 [Urine:200]  PE: Gen:  Alert, NAD, pleasant Abd: Soft, distended, mild tenderness, +BS, no HSM   Lab Results:   Recent Labs  04/09/15 2148  WBC 9.4  HGB 14.1  HCT 41.5  PLT 300   BMET  Recent Labs  04/09/15 2148 04/11/15 0532  NA 138 142  K 3.3* 4.8  CL 103 111  CO2 26 19*  GLUCOSE 134* 64*  BUN 12 10  CREATININE 0.90 0.94  CALCIUM 9.7 9.1   PT/INR No results for input(s): LABPROT, INR in the last 72 hours. CMP     Component Value Date/Time   NA 142 04/11/2015 0532   K 4.8 04/11/2015 0532   CL 111 04/11/2015 0532   CO2 19* 04/11/2015 0532   GLUCOSE 64* 04/11/2015 0532   BUN 10 04/11/2015 0532   CREATININE 0.94 04/11/2015 0532   CALCIUM 9.1 04/11/2015 0532   PROT 7.2 04/09/2015 2148   ALBUMIN 3.6 04/09/2015 2148   AST 25 04/09/2015 2148   ALT 13* 04/09/2015 2148   ALKPHOS 75 04/09/2015 2148   BILITOT 0.6 04/09/2015 2148   GFRNONAA 58* 04/11/2015 0532   GFRAA >60 04/11/2015 0532   Lipase     Component Value Date/Time   LIPASE 25 04/09/2015 2148       Studies/Results: Dg Abd Portable 1v  04/12/2015  CLINICAL DATA:  Abdominal pain, follow up small bowel obstruction EXAM: PORTABLE ABDOMEN - 1 VIEW COMPARISON:  04/11/2015 FINDINGS:  Persistent small bowel dilatation is noted. Contrast material is noted throughout the colon from previous administration. No new focal abnormality is seen. No free air is noted. IMPRESSION: Stable small bowel dilatation. Electronically Signed   By: Inez Catalina M.D.   On: 04/12/2015 09:43   Dg Abd Portable 1v-small Bowel Obstruction Protocol-24 Hr Delay  04/11/2015  CLINICAL DATA:  Small-bowel follow-through, 24 hour delay film EXAM: PORTABLE ABDOMEN - 1 VIEW COMPARISON:  04/09/2015 FINDINGS: Scattered large and small bowel gas is noted. Persistent small bowel dilatation is seen similar to that noted on the recent CT examination. Contrast material is noted throughout the colon. These changes would suggest a partial small bowel obstruction. No free air is seen. No other focal abnormality is noted. IMPRESSION: 24 hour delayed film shows contrast material within the colon indicating a partial small bowel obstruction. Persistent small bowel dilatation is noted. Electronically Signed   By: Inez Catalina M.D.   On: 04/11/2015 07:10    Anti-infectives: Anti-infectives    None       Assessment/Plan PSBO/N/V -Contrast has made it to the colon, but SB loops still large, abdomen bloated, but good BS. Having BM's after contrast.  Continue clears for now. Mobilize in halls and to  chair frequently to aid in good bowel function.  Repeat KUB and labs in am.  Add dulcolax, and robaxin.  Chronic abdominal pain - followed by Dr. Watt Climes H/o tubal ligation and hysterectomy    LOS: 2 days    Nat Christen 04/12/2015, 10:22 AM Pager: [phone]

## 2015-04-13 ENCOUNTER — Inpatient Hospital Stay (HOSPITAL_COMMUNITY): Payer: Commercial Managed Care - HMO

## 2015-04-13 DIAGNOSIS — R1084 Generalized abdominal pain: Secondary | ICD-10-CM | POA: Insufficient documentation

## 2015-04-13 LAB — BASIC METABOLIC PANEL
Anion gap: 12 (ref 5–15)
BUN: 7 mg/dL (ref 6–20)
CO2: 20 mmol/L — ABNORMAL LOW (ref 22–32)
Calcium: 9.4 mg/dL (ref 8.9–10.3)
Chloride: 107 mmol/L (ref 101–111)
Creatinine, Ser: 0.91 mg/dL (ref 0.44–1.00)
GFR calc Af Amer: >60 mL/min (ref >60)
GFR calc non Af Amer: >60 mL/min (ref >60)
Glucose, Bld: 113 mg/dL — ABNORMAL HIGH (ref 65–99)
Potassium: 4.8 mmol/L (ref 3.5–5.1)
Sodium: 139 mmol/L (ref 135–145)

## 2015-04-13 IMAGING — DX DG ABDOMEN 2V
2 series · 3 of 3 positions shown · non-contrast
Comparison: [DATE]

CLINICAL DATA: Abdominal pain with nausea and vomiting

EXAM:
ABDOMEN - 2 VIEW

[Series 1: abdomen erect · 0.14mm/px · 2 of 2 slices shown]
[im 1/2]
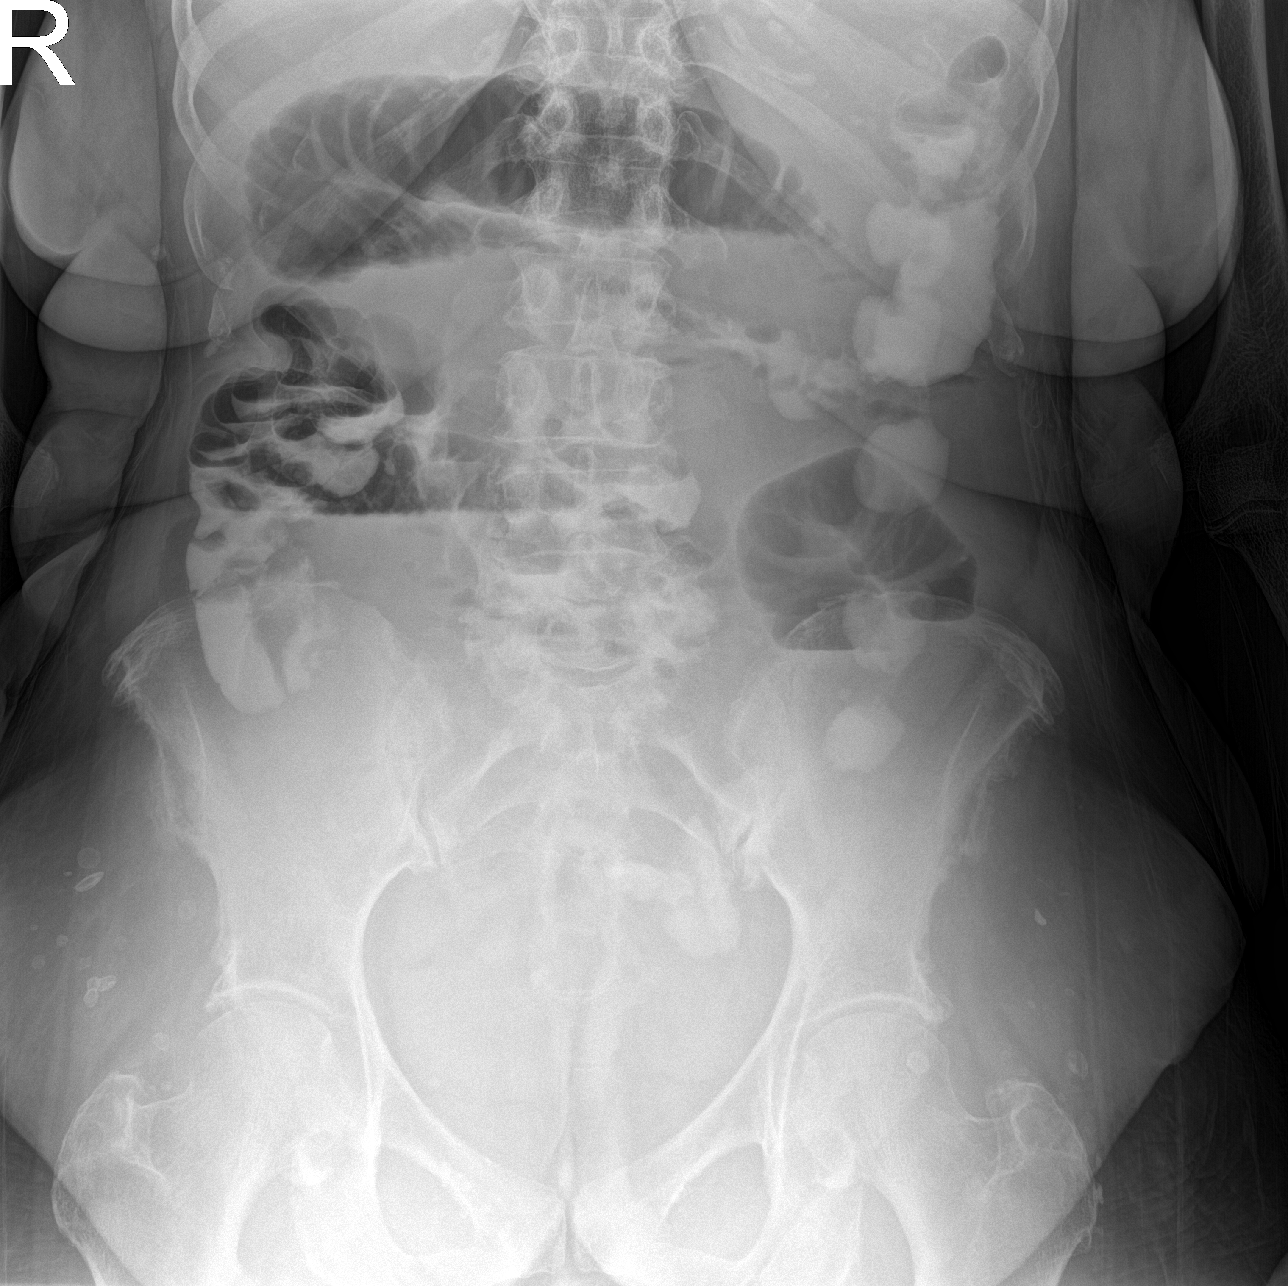
[im 2/2]
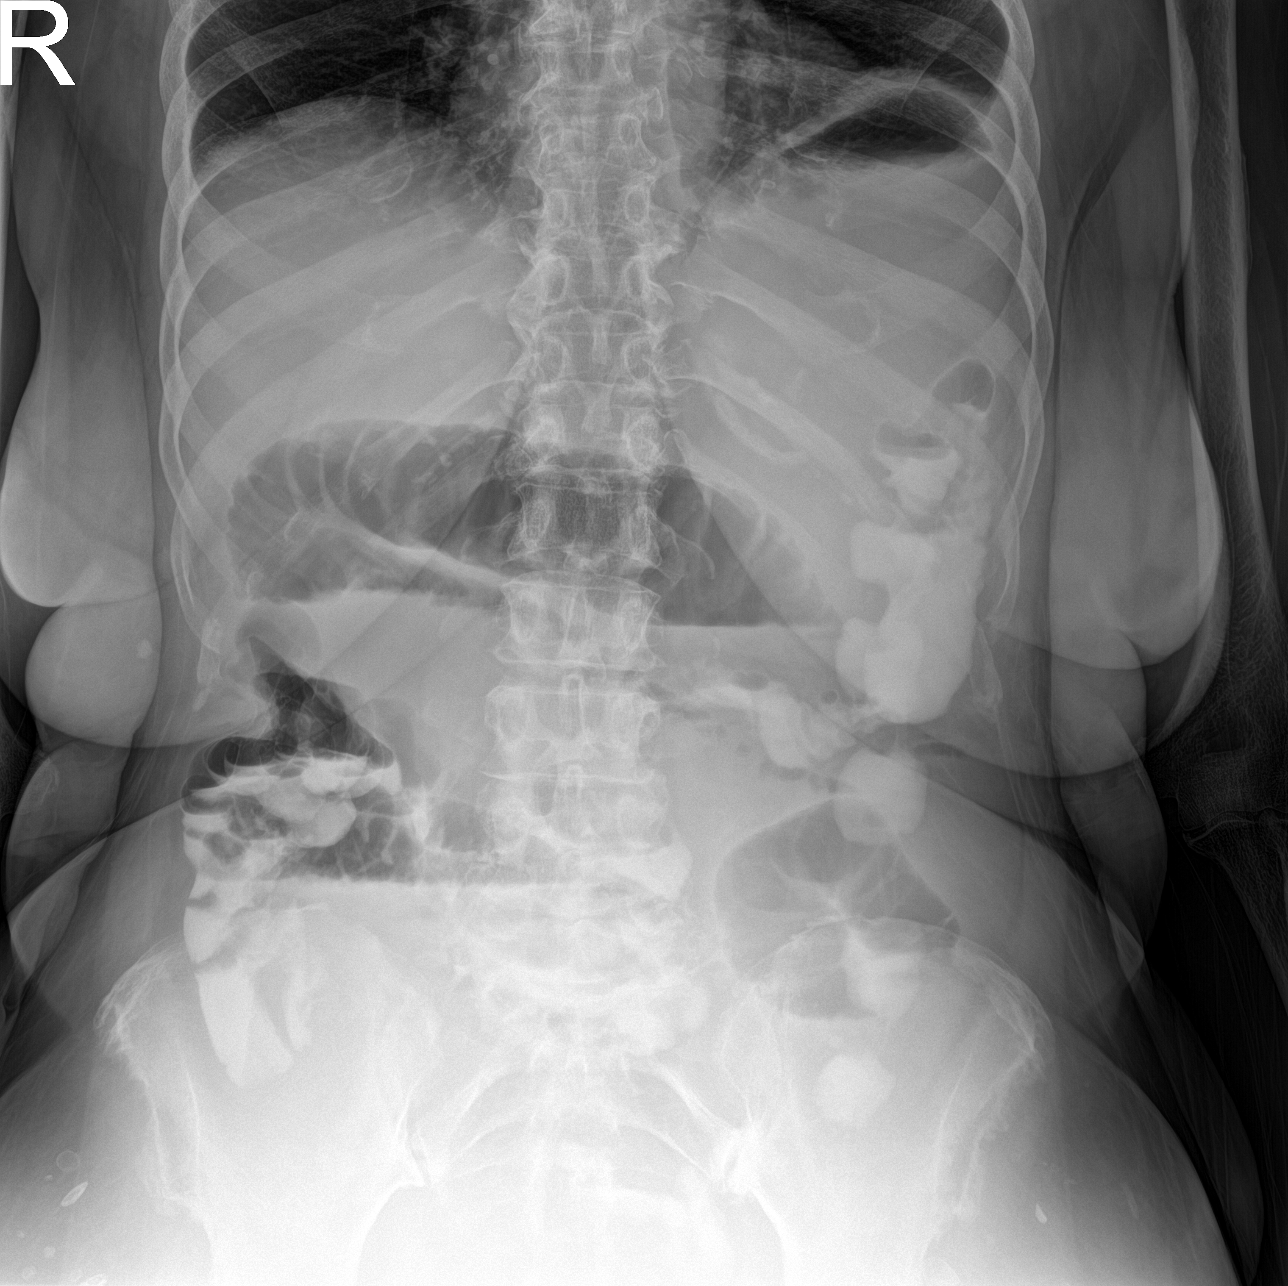

[abdomen supine]
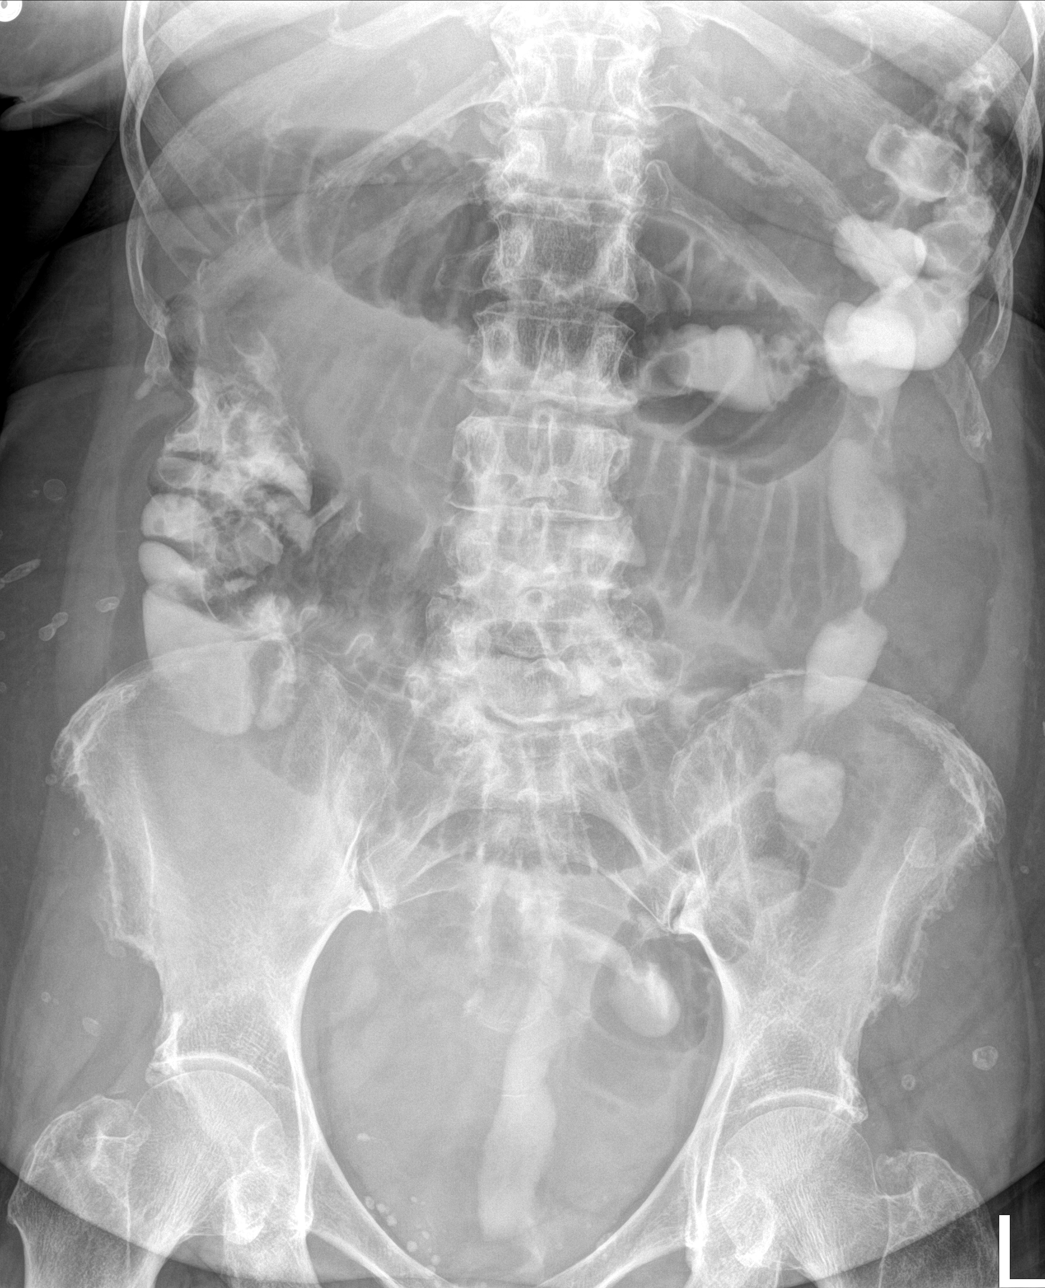

[3 of 3 positions shown; findings below may reference images not displayed]

FINDINGS: Stable small bowel dilatation is noted with air-fluid levels.
Contrast material is again noted within the colon. No free air is
seen. No abnormal mass is noted. Degenerative changes of the lumbar
spine are seen.
IMPRESSION: Persistent small bowel dilatation stable from the prior exam.

## 2015-04-13 NOTE — Progress Notes (Signed)
Subjective: Still complaining of pain, no flatus and no BM.Jama Flavors x 2 yesterday, I told family to get her up walking about 8 times or more today.  She says she vomited x 1 after suppository yesterday.  Otherwise no nausea.  Objective: Vital signs in last 24 hours: Temp:  [98.2 F (36.8 C)-98.5 F (36.9 C)] 98.4 F (36.9 C) (11/23 0514) Pulse Rate:  [75-84] 84 (11/23 0514) Resp:  [13-19] 16 (11/23 0514) BP: (151-161)/(77-90) 161/90 mmHg (11/23 0514) SpO2:  [99 %-100 %] 100 % (11/23 0514) Last BM Date: 04/12/15 680 PO recorded yesterday Diet:  clears 350 urine recorded BP up occasionally  Film yesterday showed contrast in colon with some ongoing SB dilitation Intake/Output from previous day: 11/22 0701 - 11/23 0700 In: 680 [P.O.:680] Out: 350 [Urine:350] Intake/Output this shift: Total I/O In: 300 [P.O.:300] Out: -   General appearance: alert, cooperative, no distress and daughter is getting her up to walk in halls now. GI: soft, few BS, denies flatus, or BM, she says she is tight, but feels soft to me.  Lab Results:  No results for input(s): WBC, HGB, HCT, PLT in the last 72 hours.  BMET  Recent Labs  04/11/15 0532 04/13/15 0415  NA 142 139  K 4.8 4.8  CL 111 107  CO2 19* 20*  GLUCOSE 64* 113*  BUN 10 7  CREATININE 0.94 0.91  CALCIUM 9.1 9.4   PT/INR No results for input(s): LABPROT, INR in the last 72 hours.   Recent Labs Lab 04/09/15 2148  AST 25  ALT 13*  ALKPHOS 75  BILITOT 0.6  PROT 7.2  ALBUMIN 3.6     Lipase     Component Value Date/Time   LIPASE 25 04/09/2015 2148     Studies/Results: Dg Abd Portable 1v  04/12/2015  CLINICAL DATA:  Abdominal pain, follow up small bowel obstruction EXAM: PORTABLE ABDOMEN - 1 VIEW COMPARISON:  04/11/2015 FINDINGS: Persistent small bowel dilatation is noted. Contrast material is noted throughout the colon from previous administration. No new focal abnormality is seen. No free air is noted. IMPRESSION:  Stable small bowel dilatation. Electronically Signed   By: Inez Catalina M.D.   On: 04/12/2015 09:43    Medications: . azelastine  2 spray Each Nare Daily  . bisacodyl  10 mg Rectal Daily  . budesonide-formoterol  2 puff Inhalation Daily  . feeding supplement  1 Container Oral TID BM  . heparin  5,000 Units Subcutaneous 3 times per day  . metoCLOPramide (REGLAN) injection  10 mg Intravenous 3 times per day  . pantoprazole  80 mg Oral Daily      Prior to Admission medications   Medication Sig Start Date End Date Taking? Authorizing Provider  acetaminophen (TYLENOL) 500 MG tablet Take 500 mg by mouth daily as needed for mild pain, moderate pain, fever or headache. For pain   Yes Historical Provider, MD  albuterol (PROVENTIL) (2.5 MG/3ML) 0.083% nebulizer solution Take 2.5 mg by nebulization 2 (two) times daily as needed for wheezing or shortness of breath.    Yes Historical Provider, MD  Azelastine HCl (ASTEPRO) 0.15 % SOLN Place 2 sprays into the nose daily.    Yes Historical Provider, MD  budesonide-formoterol (SYMBICORT) 160-4.5 MCG/ACT inhaler Inhale 2 puffs into the lungs daily.   Yes Historical Provider, MD  loratadine (CLARITIN) 10 MG tablet Take 10 mg by mouth daily as needed. For allergy symptoms   Yes Historical Provider, MD  losartan-hydrochlorothiazide (HYZAAR) 100-12.5 MG per  tablet Take 1 tablet by mouth daily.   Yes Historical Provider, MD  omeprazole (PRILOSEC) 40 MG capsule Take 40 mg by mouth daily.   Yes Historical Provider, MD  Probiotic Product (PROBIOTIC PO) Take 1 capsule by mouth daily.   Yes Historical Provider, MD    Assessment/Plan PSBO Chronic abdominal pain GERD Hx of tubal ligation and hysterectomy Hypertension Asthma ABX:  None DVT:  Heparin/SCD   Plan:  Mobilize; repeat suppository and clears till she has BM.  She has order for film, but not done yet.  LOS: 3 days    Angelica [name] 04/13/2015

## 2015-04-13 NOTE — Progress Notes (Signed)
PROGRESS NOTE  Angelica [name] PTW:[id number] DOB: Dec 25, 1940 DOA: 04/09/2015 PCP: Foye Spurling, MD  Brief narrative 74 year old female with hypertension, GERD, chronic abdominal pain (sees Dr. Watt Climes), history of hysterectomy presented to the ED with several days of abdominal pain with nausea and vomiting of clear source and constipated since past 2 days. Patient was found to have minimal abdominal tenderness on exam with CT of the abdomen showing small bowel obstruction with transition point in the mid lower pelvis. Admit to medical floor and surgery consulted.     Assessment/Plan: Partial small bowel obstruction -Possibly secondary to adhesions from prior surgery. . - IV hydration. Keep K >4.  -CCS following.  -Abdomen nontender and nondistended and has bowel sounds. Having abdominal cramps after gastrografin .  -continue clears per CCS -2 small BM today--11/23 -Continue supportive care with when necessary pain medications and antiemetics. Minimize opioids -continue scheduled reglan and bisacodyl. -abdominal xray still showing bowel dilatation. Repeat in am.  -encourage ambulation -11/23--daughter ordered pt soft diet tray even though pt suppose to be on clears--stay on clears until advanced by surgery   Hypokalemia Replenished Keep K >4  Essential Hypertension Stable. Monitor on when necessary hydralazine COPD -stable on RA -continue symbicort  Diet: Clear liquid   DVT prophylaxis: Subcutaneous Lovenox Code Status: Full code Family Communication: daughter updated at bedside 11/23 Disposition Plan: Home possibly in 24-48 hours if continues to improve.     Procedures/Studies: Ct Abdomen Pelvis W Contrast  04/10/2015  CLINICAL DATA:  Nausea and vomiting today. Lower abdominal pain. History of abdominal surgeries. EXAM: CT ABDOMEN AND PELVIS WITH CONTRAST TECHNIQUE: Multidetector CT imaging of the abdomen and pelvis was performed using the standard  protocol following bolus administration of intravenous contrast. CONTRAST:  52m OMNIPAQUE IOHEXOL 300 MG/ML  SOLN COMPARISON:  CT 02/17/2014 FINDINGS: Lower chest:  The included lung bases are clear. Liver: No focal hepatic lesion. Hepatobiliary: Gallbladder physiologically distended. Small wall calcification is unchanged, no calcified gallstone. No biliary dilatation. Pancreas: No ductal dilatation or inflammation. Spleen: Normal. Adrenal glands: No nodule. Kidneys: Symmetric renal enhancement and excretion. No hydronephrosis. No perinephric stranding or localizing renal abnormality. Stomach/Bowel: The stomach is decompressed. Proximal small bowel loops are nondistended. There is a moderate length segment of dilated fluid-filled small bowel loops in the lower abdomen/pelvis. Transition point seen in the midline lower pelvis image 60 (better appreciated on sagittal reformats image 90). There is a question of a more proximal transition point or posteriorly in the lower abdomen, image 52, however this is not definitive. Mild adjacent mesenteric edema and small volume of free fluid. No definite bowel wall thickening. No mesenteric swirling. No pneumatosis. Distal small bowel loops are decompressed. Small volume of stool throughout the colon without colonic wall thickening. Portions of the colon are not well defined given bowel dilatation and lack of enteric contrast. Appendix is not visualized. Vascular/Lymphatic: No retroperitoneal adenopathy. Abdominal aorta is normal in caliber. Mild atherosclerosis without aneurysm. Reproductive: Uterus presumably absent. No evidence of adnexal mass. Bladder: Physiologically distended. Other: No free air free fluid, or intra-abdominal fluid collection. Scattered soft tissue granulomas. Musculoskeletal: There are no acute or suspicious osseous abnormalities. Multilevel degenerative change throughout the spine with degenerative disc disease and facet arthropathy. Degenerative-type  anterolisthesis of L4 on L5. IMPRESSION: Small bowel obstruction with transition point in the mid mid lower pelvis. A more proximal second point is questioned but not definitive, and closed loop obstruction is not excluded.  However early bowel obstruction is favored. Electronically Signed   By: Jeb Levering M.D.   On: 04/10/2015 00:15   Dg Abd 2 Views  04/13/2015  CLINICAL DATA:  Abdominal pain with nausea and vomiting EXAM: ABDOMEN - 2 VIEW COMPARISON:  04/12/2015 FINDINGS: Stable small bowel dilatation is noted with air-fluid levels. Contrast material is again noted within the colon. No free air is seen. No abnormal mass is noted. Degenerative changes of the lumbar spine are seen. IMPRESSION: Persistent small bowel dilatation stable from the prior exam. Electronically Signed   By: Inez Catalina M.D.   On: 04/13/2015 13:33   Dg Abd Portable 1v  04/12/2015  CLINICAL DATA:  Abdominal pain, follow up small bowel obstruction EXAM: PORTABLE ABDOMEN - 1 VIEW COMPARISON:  04/11/2015 FINDINGS: Persistent small bowel dilatation is noted. Contrast material is noted throughout the colon from previous administration. No new focal abnormality is seen. No free air is noted. IMPRESSION: Stable small bowel dilatation. Electronically Signed   By: Inez Catalina M.D.   On: 04/12/2015 09:43   Dg Abd Portable 1v-small Bowel Obstruction Protocol-24 Hr Delay  04/11/2015  CLINICAL DATA:  Small-bowel follow-through, 24 hour delay film EXAM: PORTABLE ABDOMEN - 1 VIEW COMPARISON:  04/09/2015 FINDINGS: Scattered large and small bowel gas is noted. Persistent small bowel dilatation is seen similar to that noted on the recent CT examination. Contrast material is noted throughout the colon. These changes would suggest a partial small bowel obstruction. No free air is seen. No other focal abnormality is noted. IMPRESSION: 24 hour delayed film shows contrast material within the colon indicating a partial small bowel obstruction.  Persistent small bowel dilatation is noted. Electronically Signed   By: Inez Catalina M.D.   On: 04/11/2015 07:10         Subjective: Patient had 2 small bowel movements today. Abdominal pain is improving as well as abdominal bloating. Denies any fevers, chills, chest pain, shortness breath, nausea, vomiting, diarrhea. No dysuria or hematuria. No rashes.  Objective: Filed Vitals:   04/12/15 1332 04/12/15 2124 04/13/15 0514 04/13/15 1339  BP: 157/82 151/77 161/90 154/86  Pulse: 75 78 84 93  Temp: 98.2 F (36.8 C) 98.5 F (36.9 C) 98.4 F (36.9 C) 98.2 F (36.8 C)  TempSrc: Oral Oral Oral Oral  Resp: '19 13 16 19  '$ Height:      Weight:      SpO2: 100% 99% 100% 96%    Intake/Output Summary (Last 24 hours) at 04/13/15 1824 Last data filed at 04/13/15 1815  Gross per 24 hour  Intake    650 ml  Output    200 ml  Net    450 ml   Weight change:  Exam:   General:  Pt is alert, follows commands appropriately, not in acute distress  HEENT: No icterus, No thrush, No neck mass, [place]/AT  Cardiovascular: RRR, S1/S2, no rubs, no gallops  Respiratory: CTA bilaterally, no wheezing, no crackles, no rhonchi  Abdomen: Soft/+BS, non tender, non distended, no guarding  Extremities: No edema, No lymphangitis, No petechiae, No rashes, no synovitis  Data Reviewed: Basic Metabolic Panel:  Recent Labs Lab 04/09/15 2148 04/11/15 0532 04/13/15 0415  NA 138 142 139  K 3.3* 4.8 4.8  CL 103 111 107  CO2 26 19* 20*  GLUCOSE 134* 64* 113*  BUN '12 10 7  '$ CREATININE 0.90 0.94 0.91  CALCIUM 9.7 9.1 9.4   Liver Function Tests:  Recent Labs Lab 04/09/15 2148  AST 25  ALT 13*  ALKPHOS 75  BILITOT 0.6  PROT 7.2  ALBUMIN 3.6    Recent Labs Lab 04/09/15 2148  LIPASE 25   No results for input(s): AMMONIA in the last 168 hours. CBC:  Recent Labs Lab 04/09/15 2148  WBC 9.4  HGB 14.1  HCT 41.5  MCV 88.9  PLT 300   Cardiac Enzymes: No results for input(s): CKTOTAL, CKMB,  CKMBINDEX, TROPONINI in the last 168 hours. BNP: Invalid input(s): POCBNP CBG: No results for input(s): GLUCAP in the last 168 hours.  No results found for this or any previous visit (from the past 240 hour(s)).   Scheduled Meds: . azelastine  2 spray Each Nare Daily  . bisacodyl  10 mg Rectal Daily  . budesonide-formoterol  2 puff Inhalation Daily  . feeding supplement  1 Container Oral TID BM  . heparin  5,000 Units Subcutaneous 3 times per day  . metoCLOPramide (REGLAN) injection  10 mg Intravenous 3 times per day  . pantoprazole  80 mg Oral Daily   Continuous Infusions:    Bravery Ketcham, DO  [facility] Pager [phone]  If 7PM-7AM, please contact night-coverage [email or web address] Password [email or web address] 04/13/2015, 6:24 PM   LOS: 3 days

## 2015-04-14 ENCOUNTER — Inpatient Hospital Stay (HOSPITAL_COMMUNITY): Payer: Commercial Managed Care - HMO

## 2015-04-14 LAB — BASIC METABOLIC PANEL
Anion gap: 15 (ref 5–15)
BUN: 15 mg/dL (ref 6–20)
CO2: 18 mmol/L — ABNORMAL LOW (ref 22–32)
Calcium: 9.7 mg/dL (ref 8.9–10.3)
Chloride: 104 mmol/L (ref 101–111)
Creatinine, Ser: 1.05 mg/dL — ABNORMAL HIGH (ref 0.44–1.00)
GFR calc Af Amer: 59 mL/min — ABNORMAL LOW (ref >60)
GFR calc non Af Amer: 51 mL/min — ABNORMAL LOW (ref >60)
Glucose, Bld: 98 mg/dL (ref 65–99)
Potassium: 4.2 mmol/L (ref 3.5–5.1)
Sodium: 137 mmol/L (ref 135–145)

## 2015-04-14 LAB — CBC
HCT: 44.7 % (ref 36.0–46.0)
Hemoglobin: 15.8 g/dL — ABNORMAL HIGH (ref 12.0–15.0)
MCH: 31.1 pg (ref 26.0–34.0)
MCHC: 35.3 g/dL (ref 30.0–36.0)
MCV: 88 fL (ref 78.0–100.0)
Platelets: 305 10*3/uL (ref 150–400)
RBC: 5.08 MIL/uL (ref 3.87–5.11)
RDW: 13.7 % (ref 11.5–15.5)
WBC: 8.5 10*3/uL (ref 4.0–10.5)

## 2015-04-14 IMAGING — DX DG ABDOMEN 2V
2 series · 2 of 2 positions shown · non-contrast
Comparison: [DATE]

CLINICAL DATA: Small bowel obstruction with emesis and pain

EXAM:
ABDOMEN - 2 VIEW

[abdomen erect]
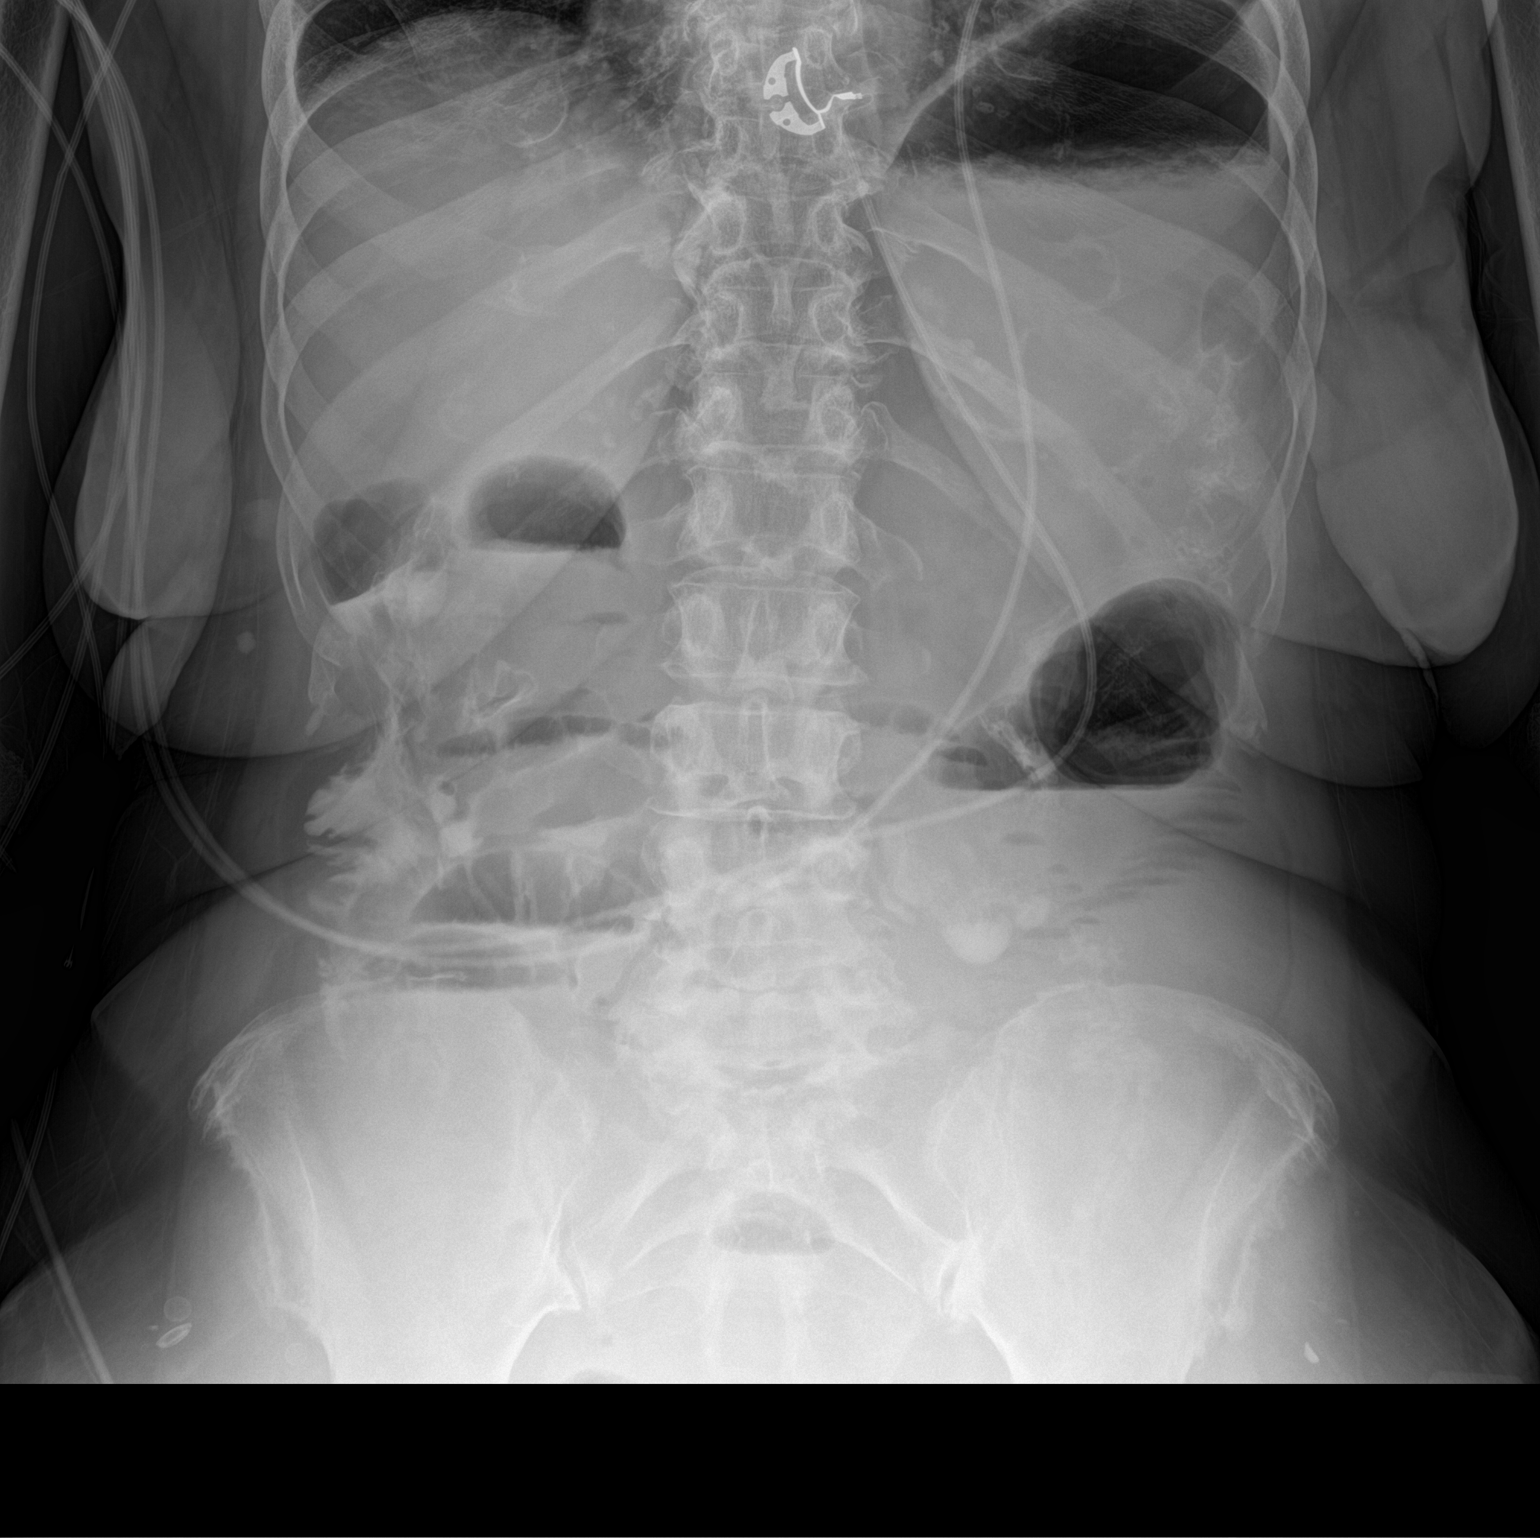

[abdomen supine]
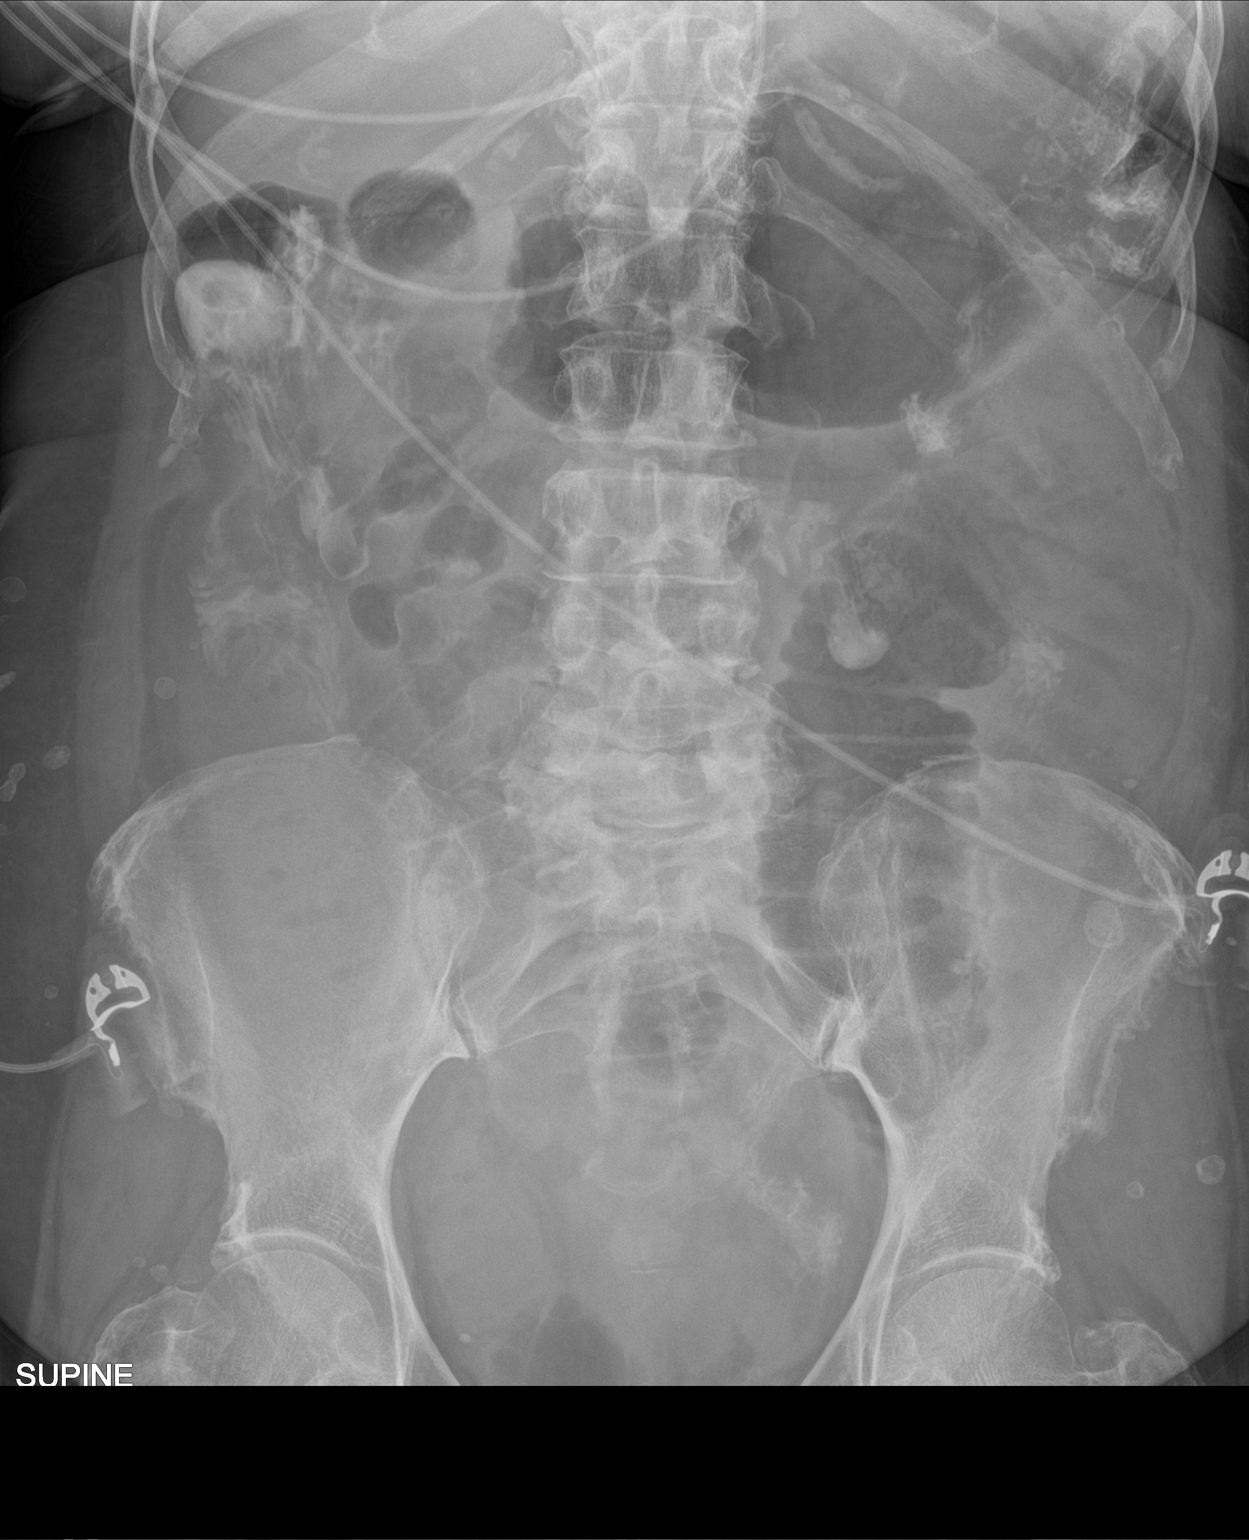

[2 of 2 positions shown; findings below may reference images not displayed]

FINDINGS: Supine and upright images obtained. There is dilated small bowel in
the left mid abdomen. There are multiple air-fluid levels. A small
amount of contrast is seen in the colon. No free air is apparent.
There are phleboliths in the pelvis. There is moderate air in the
stomach.
IMPRESSION: The bowel gas pattern is consistent with a degree of obstruction. No
free air apparent.

## 2015-04-14 IMAGING — CR DG ABD PORTABLE 1V
1 series · 1 of 1 positions shown · non-contrast
Comparison: Abdominal radiograph [DATE].

CLINICAL DATA: Patient status post NG tube placement.

EXAM:
PORTABLE ABDOMEN - 1 VIEW

[AP]
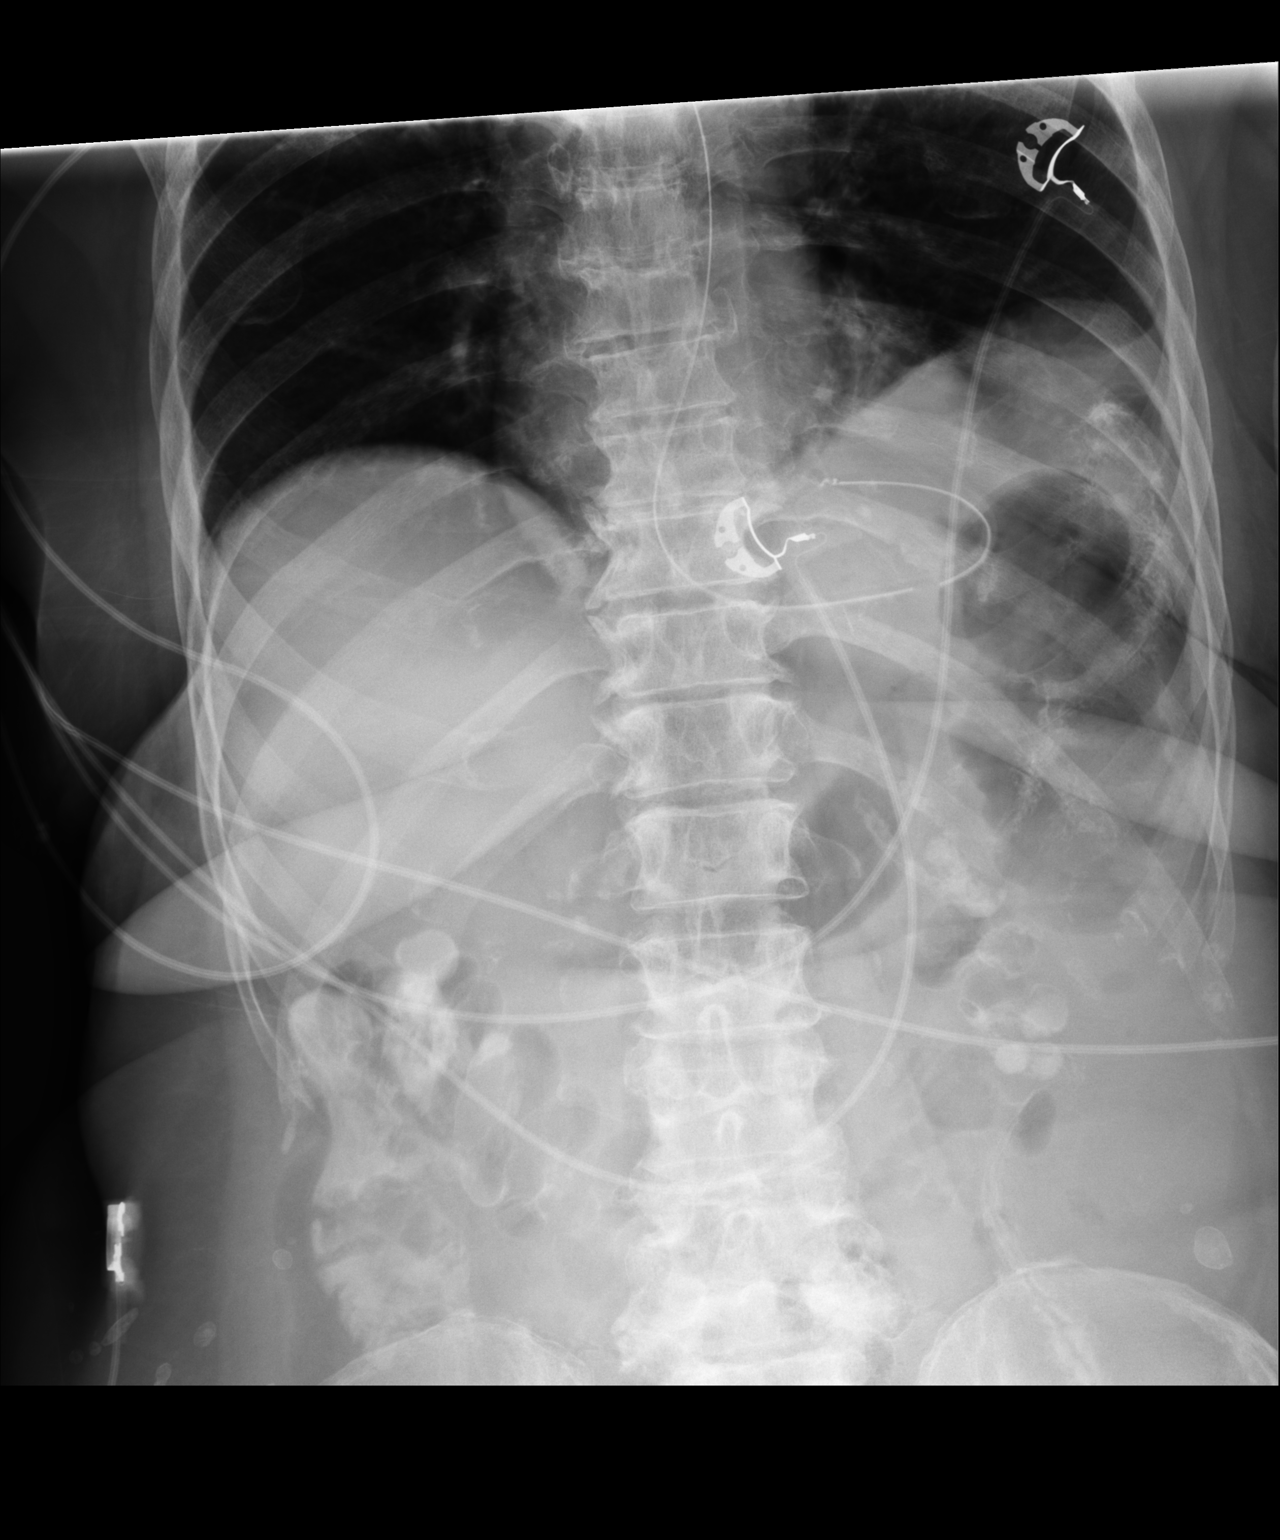

[1 of 1 positions shown; findings below may reference images not displayed]

FINDINGS: Enteric tube tip and side-port project over the stomach. Gas is
demonstrated within the colon. Oral contrast material within the
colon. Lung bases are clear. Lower thoracic and lumbar spine
degenerative changes.
IMPRESSION: NG tube tip and side-port project over the stomach.

## 2015-04-14 MED ORDER — PROMETHAZINE HCL 25 MG/ML IJ SOLN
12.5000 mg | Freq: Once | INTRAMUSCULAR | Status: DC
  Filled 2015-04-14: qty 1

## 2015-04-14 MED ORDER — PANTOPRAZOLE SODIUM 40 MG IV SOLR
40.0000 mg | INTRAVENOUS | Status: DC
  Administered 2015-04-15 – 2015-04-22 (×8): 40 mg via INTRAVENOUS
  Filled 2015-04-14 (×8): qty 40

## 2015-04-14 MED ORDER — PHENOL 1.4 % MT LIQD
1.0000 | OROMUCOSAL | Status: DC | PRN
  Administered 2015-04-14 – 2015-04-21 (×2): 1 via OROMUCOSAL
  Filled 2015-04-14 (×2): qty 177

## 2015-04-14 MED ORDER — SODIUM CHLORIDE 0.9 % IV SOLN
INTRAVENOUS | Status: DC
  Administered 2015-04-14 – 2015-04-17 (×5): via INTRAVENOUS
  Filled 2015-04-14 (×7): qty 1000

## 2015-04-14 NOTE — Progress Notes (Signed)
PROGRESS NOTE  Angelica [name] SPQ:[id number] DOB: [birth year] DOA: 04/09/2015 PCP: Foye Spurling, MD   Brief narrative 74 year old female with hypertension, GERD, chronic abdominal pain (sees Dr. Watt Climes), history of hysterectomy presented to the ED with several days of abdominal pain with nausea and vomiting of clear source and constipated since past 2 days. Patient was found to have minimal abdominal tenderness on exam with CT of the abdomen showing small bowel obstruction with transition point in the mid lower pelvis. Admit to medical floor and surgery consulted. The patient was ultimately started on a clear liquid diet. Unfortunately, she continued to have vomiting. Abdominal x-ray on 04/13/2015 revealed persistent small bowel dilatation. As result, NG tube will be placed to low intermittent suction   Assessment/Plan: Partial small bowel obstruction -Possibly secondary to adhesions from prior surgery. . - IV hydration. Keep K >4.  -CCS following.  -continue clears per CCS--unfortunately, patient had emesis even with clear liquids for lunch on 04/14/2015 -2 small BM today--11/24 -Continue supportive care with when necessary pain medications and antiemetics. Minimize opioids -continue scheduled reglan and bisacodyl. -place NG to LIS -04/14/15--case discussed with Dr. Kieth Brightly -encourage ambulation -NPO -switch essential meds to IV if possible -start IVF  Hypokalemia Replenished Keep K >4  Essential Hypertension Stable. Monitor on when necessary hydralazine  COPD -stable on RA -continue symbicort  Diet: NPO   DVT prophylaxis: Subcutaneous Lovenox Code Status: Full code Family Communication: daughter updated at bedside 11/24 Disposition Plan: pending clinical improvement    Procedures/Studies: Ct Abdomen Pelvis W Contrast  04/10/2015  CLINICAL DATA:  Nausea and vomiting today. Lower abdominal pain. History of abdominal surgeries. EXAM: CT ABDOMEN AND  PELVIS WITH CONTRAST TECHNIQUE: Multidetector CT imaging of the abdomen and pelvis was performed using the standard protocol following bolus administration of intravenous contrast. CONTRAST:  27m OMNIPAQUE IOHEXOL 300 MG/ML  SOLN COMPARISON:  CT 02/17/2014 FINDINGS: Lower chest:  The included lung bases are clear. Liver: No focal hepatic lesion. Hepatobiliary: Gallbladder physiologically distended. Small wall calcification is unchanged, no calcified gallstone. No biliary dilatation. Pancreas: No ductal dilatation or inflammation. Spleen: Normal. Adrenal glands: No nodule. Kidneys: Symmetric renal enhancement and excretion. No hydronephrosis. No perinephric stranding or localizing renal abnormality. Stomach/Bowel: The stomach is decompressed. Proximal small bowel loops are nondistended. There is a moderate length segment of dilated fluid-filled small bowel loops in the lower abdomen/pelvis. Transition point seen in the midline lower pelvis image 60 (better appreciated on sagittal reformats image 90). There is a question of a more proximal transition point or posteriorly in the lower abdomen, image 52, however this is not definitive. Mild adjacent mesenteric edema and small volume of free fluid. No definite bowel wall thickening. No mesenteric swirling. No pneumatosis. Distal small bowel loops are decompressed. Small volume of stool throughout the colon without colonic wall thickening. Portions of the colon are not well defined given bowel dilatation and lack of enteric contrast. Appendix is not visualized. Vascular/Lymphatic: No retroperitoneal adenopathy. Abdominal aorta is normal in caliber. Mild atherosclerosis without aneurysm. Reproductive: Uterus presumably absent. No evidence of adnexal mass. Bladder: Physiologically distended. Other: No free air free fluid, or intra-abdominal fluid collection. Scattered soft tissue granulomas. Musculoskeletal: There are no acute or suspicious osseous abnormalities.  Multilevel degenerative change throughout the spine with degenerative disc disease and facet arthropathy. 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 IMPRESSION: Small bowel obstruction with transition point in the mid mid lower pelvis. A more proximal second point  is questioned but not definitive, and closed loop obstruction is not excluded. However early bowel obstruction is favored. Electronically Signed   By: Jeb Levering M.D.   On: 04/10/2015 00:15   Dg Abd 2 Views  04/13/2015  CLINICAL DATA:  Abdominal pain with nausea and vomiting EXAM: ABDOMEN - 2 VIEW COMPARISON:  04/12/2015 FINDINGS: Stable small bowel dilatation is noted with air-fluid levels. Contrast material is again noted within the colon. No free air is seen. No abnormal mass is noted. Degenerative changes of the lumbar spine are seen. IMPRESSION: Persistent small bowel dilatation stable from the prior exam. Electronically Signed   By: Inez Catalina M.D.   On: 04/13/2015 13:33   Dg Abd Portable 1v  04/12/2015  CLINICAL DATA:  Abdominal pain, follow up small bowel obstruction EXAM: PORTABLE ABDOMEN - 1 VIEW COMPARISON:  04/11/2015 FINDINGS: Persistent small bowel dilatation is noted. Contrast material is noted throughout the colon from previous administration. No new focal abnormality is seen. No free air is noted. IMPRESSION: Stable small bowel dilatation. Electronically Signed   By: Inez Catalina M.D.   On: 04/12/2015 09:43   Dg Abd Portable 1v-small Bowel Obstruction Protocol-24 Hr Delay  04/11/2015  CLINICAL DATA:  Small-bowel follow-through, 24 hour delay film EXAM: PORTABLE ABDOMEN - 1 VIEW COMPARISON:  04/09/2015 FINDINGS: Scattered large and small bowel gas is noted. Persistent small bowel dilatation is seen similar to that noted on the recent CT examination. Contrast material is noted throughout the colon. These changes would suggest a partial small bowel obstruction. No free air is seen. No other focal abnormality is noted.  IMPRESSION: 24 hour delayed film shows contrast material within the colon indicating a partial small bowel obstruction. Persistent small bowel dilatation is noted. Electronically Signed   By: Inez Catalina M.D.   On: 04/11/2015 07:10         Subjective: Patient continues to vomit. Had an episode of emesis after drinking some broth. Complains of abdominal cramping and bloating. Denies any fevers, chills, chest pain, shortness breath, dysuria, hematuria. Has 2 small bowel movements. No hematochezia, melena.  Objective: Filed Vitals:   04/13/15 1339 04/13/15 2143 04/14/15 0604 04/14/15 1418  BP: 154/86 139/85 142/84 150/93  Pulse: 93 117 106 108  Temp: 98.2 F (36.8 C) 98.6 F (37 C) 98.1 F (36.7 C) 99.2 F (37.3 C)  TempSrc: Oral Oral Oral Oral  Resp: '19 20 18 20  '$ Height:      Weight:      SpO2: 96% 98% 99% 100%    Intake/Output Summary (Last 24 hours) at 04/14/15 1427 Last data filed at 04/14/15 0908  Gross per 24 hour  Intake    508 ml  Output    600 ml  Net    -92 ml   Weight change:  Exam:   General:  Pt is alert, follows commands appropriately, not in acute distress  HEENT: No icterus, No thrush, No neck mass, [place]/AT  Cardiovascular: RRR, S1/S2, no rubs, no gallops  Respiratory: CTA bilaterally, no wheezing, no crackles, no rhonchi  Abdomen: Soft/diminished BS, diffusely tender, no guarding  Extremities: No edema, No lymphangitis, No petechiae, No rashes, no synovitis; no cyanosis or clubbing  Data Reviewed: Basic Metabolic Panel:  Recent Labs Lab 04/09/15 2148 04/11/15 0532 04/13/15 0415 04/14/15 0219  NA 138 142 139 137  K 3.3* 4.8 4.8 4.2  CL 103 111 107 104  CO2 26 19* 20* 18*  GLUCOSE 134* 64* 113* 98  BUN 12  $'10 7 15  'F$ CREATININE 0.90 0.94 0.91 1.05*  CALCIUM 9.7 9.1 9.4 9.7   Liver Function Tests:  Recent Labs Lab 04/09/15 2148  AST 25  ALT 13*  ALKPHOS 75  BILITOT 0.6  PROT 7.2  ALBUMIN 3.6    Recent Labs Lab 04/09/15 2148    LIPASE 25   No results for input(s): AMMONIA in the last 168 hours. CBC:  Recent Labs Lab 04/09/15 2148 04/14/15 0219  WBC 9.4 8.5  HGB 14.1 15.8*  HCT 41.5 44.7  MCV 88.9 88.0  PLT 300 305   Cardiac Enzymes: No results for input(s): CKTOTAL, CKMB, CKMBINDEX, TROPONINI in the last 168 hours. BNP: Invalid input(s): POCBNP CBG: No results for input(s): GLUCAP in the last 168 hours.  No results found for this or any previous visit (from the past 240 hour(s)).   Scheduled Meds: . azelastine  2 spray Each Nare Daily  . bisacodyl  10 mg Rectal Daily  . budesonide-formoterol  2 puff Inhalation Daily  . feeding supplement  1 Container Oral TID BM  . heparin  5,000 Units Subcutaneous 3 times per day  . metoCLOPramide (REGLAN) injection  10 mg Intravenous 3 times per day  . pantoprazole  80 mg Oral Daily  . promethazine  12.5 mg Intravenous Once   Continuous Infusions:    Elias Bordner, DO  [facility] Pager [phone]  If 7PM-7AM, please contact night-coverage [email or web address] Password [email or web address] 04/14/2015, 2:27 PM   LOS: 4 days

## 2015-04-14 NOTE — Plan of Care (Signed)
Problem: Nutrition: Goal: Adequate nutrition will be maintained Outcome: Not Progressing Pt placed back on NPO.  Receiving supplemental fluids.  Problem: Bowel/Gastric: Goal: Will not experience complications related to bowel motility Outcome: Progressing NG tube placed to encourage bowel rest.

## 2015-04-14 NOTE — Progress Notes (Addendum)
Pt complaining of nausea/vomiting. Paged and notified Baltazar Najjar, NP. Baltazar Najjar called back-gave telephone order with readback for 12.5 mg Phenergan IV once. Will carry out order. Upon entering room, pt stated nausea passed. Medication not given.

## 2015-04-14 NOTE — Progress Notes (Signed)
  Subjective: Vomited several times overnight but had a couple bm's this am.  Objective: Vital signs in last 24 hours: Temp:  [98.1 F (36.7 C)-98.6 F (37 C)] 98.1 F (36.7 C) (11/24 0604) Pulse Rate:  [93-117] 106 (11/24 0604) Resp:  [18-20] 18 (11/24 0604) BP: (139-154)/(84-86) 142/84 mmHg (11/24 0604) SpO2:  [96 %-99 %] 99 % (11/24 0604) Last BM Date: 2015-05-04  Intake/Output from previous day: 05-04-2023 0701 - 11/24 0700 In: 650 [P.O.:650] Out: 200 [Urine:200] Intake/Output this shift:    Resp: clear to auscultation bilaterally Cardio: regular rate and rhythm GI: soft, mild distension. nontender  Lab Results:   Recent Labs  04/14/15 0219  WBC 8.5  HGB 15.8*  HCT 44.7  PLT 305   BMET  Recent Labs  04-May-2015 0415 04/14/15 0219  NA 139 137  K 4.8 4.2  CL 107 104  CO2 20* 18*  GLUCOSE 113* 98  BUN 7 15  CREATININE 0.91 1.05*  CALCIUM 9.4 9.7   PT/INR No results for input(s): LABPROT, INR in the last 72 hours. ABG No results for input(s): PHART, HCO3 in the last 72 hours.  Invalid input(s): PCO2, PO2  Studies/Results: Dg Abd 2 Views  05/04/2015  CLINICAL DATA:  Abdominal pain with nausea and vomiting EXAM: ABDOMEN - 2 VIEW COMPARISON:  04/12/2015 FINDINGS: Stable small bowel dilatation is noted with air-fluid levels. Contrast material is again noted within the colon. No free air is seen. No abnormal mass is noted. Degenerative changes of the lumbar spine are seen. IMPRESSION: Persistent small bowel dilatation stable from the prior exam. Electronically Signed   By: Inez Catalina M.D.   On: 05-04-15 13:33   Dg Abd Portable 1v  04/12/2015  CLINICAL DATA:  Abdominal pain, follow up small bowel obstruction EXAM: PORTABLE ABDOMEN - 1 VIEW COMPARISON:  04/11/2015 FINDINGS: Persistent small bowel dilatation is noted. Contrast material is noted throughout the colon from previous administration. No new focal abnormality is seen. No free air is noted. IMPRESSION:  Stable small bowel dilatation. Electronically Signed   By: Inez Catalina M.D.   On: 04/12/2015 09:43    Anti-infectives: Anti-infectives    None      Assessment/Plan: s/p * No surgery found * If she vomits again then she should be npo and have ng placed. Would start small bowel obstruction protocol Ambulate Recheck abdominal xrays in am Will follow  LOS: 4 days    TOTH III,Mikael Skoda S 04/14/2015

## 2015-04-15 DIAGNOSIS — K56609 Unspecified intestinal obstruction, unspecified as to partial versus complete obstruction: Secondary | ICD-10-CM | POA: Insufficient documentation

## 2015-04-15 LAB — BASIC METABOLIC PANEL
Anion gap: 11 (ref 5–15)
BUN: 20 mg/dL (ref 6–20)
CO2: 22 mmol/L (ref 22–32)
Calcium: 9.2 mg/dL (ref 8.9–10.3)
Chloride: 105 mmol/L (ref 101–111)
Creatinine, Ser: 1.07 mg/dL — ABNORMAL HIGH (ref 0.44–1.00)
GFR calc Af Amer: 57 mL/min — ABNORMAL LOW (ref >60)
GFR calc non Af Amer: 49 mL/min — ABNORMAL LOW (ref >60)
Glucose, Bld: 76 mg/dL (ref 65–99)
Potassium: 4.1 mmol/L (ref 3.5–5.1)
Sodium: 138 mmol/L (ref 135–145)

## 2015-04-15 LAB — MAGNESIUM: Magnesium: 1.8 mg/dL (ref 1.7–2.4)

## 2015-04-15 LAB — PHOSPHORUS: Phosphorus: 3.2 mg/dL (ref 2.5–4.6)

## 2015-04-15 MED ORDER — ACETAMINOPHEN 650 MG RE SUPP
650.0000 mg | Freq: Every day | RECTAL | Status: DC | PRN
  Administered 2015-04-15 – 2015-04-17 (×3): 650 mg via RECTAL
  Filled 2015-04-15 (×3): qty 1

## 2015-04-15 NOTE — Progress Notes (Signed)
PROGRESS NOTE  Angelica [name] OMV:[id number] DOB: 05/18/1941 DOA: 04/09/2015 PCP: Foye Spurling, MD  Brief narrative 74 year old female with hypertension, GERD, chronic abdominal pain (sees Dr. Watt Climes), history of hysterectomy presented to the ED with several days of abdominal pain with nausea and vomiting of clear source and constipated since past 2 days. Patient was found to have minimal abdominal tenderness on exam with CT of the abdomen showing small bowel obstruction with transition point in the mid lower pelvis. Admit to medical floor and surgery consulted. The patient was ultimately started on a clear liquid diet. Unfortunately, she continued to have vomiting. Abdominal x-ray on 04/13/2015 revealed persistent small bowel dilatation. As result, NG tube will be placed to low intermittent suction   Assessment/Plan: Partial small bowel obstruction -Possibly secondary to adhesions from prior surgery. . - IV hydration. Keep K >4.  -CCS following.  -Continue supportive care with when necessary pain medications and antiemetics. Minimize opioids -continue scheduled reglan and bisacodyl. -place NG to LIS-->no further vomiting -encourage ambulation -Remain NPO -switch essential meds to IV if possible -Continue IVF -am AXR  Hypokalemia Replenished Keep K >4  Essential Hypertension Stable. Monitor on when necessary hydralazine  COPD -stable on RA -continue symbicort  Diet: NPO   DVT prophylaxis: Subcutaneous Lovenox Code Status: Full code Family Communication: daughter and sister updated at bedside 11/25 Disposition Plan: pending clinical improvement    Procedures/Studies: Ct Abdomen Pelvis W Contrast  04/10/2015  CLINICAL DATA:  Nausea and vomiting today. Lower abdominal pain. History of abdominal surgeries. EXAM: CT ABDOMEN AND PELVIS WITH CONTRAST TECHNIQUE: Multidetector CT imaging of the abdomen and pelvis was performed using the standard protocol  following bolus administration of intravenous contrast. CONTRAST:  78m OMNIPAQUE IOHEXOL 300 MG/ML  SOLN COMPARISON:  CT 02/17/2014 FINDINGS: Lower chest:  The included lung bases are clear. Liver: No focal hepatic lesion. Hepatobiliary: Gallbladder physiologically distended. Small wall calcification is unchanged, no calcified gallstone. No biliary dilatation. Pancreas: No ductal dilatation or inflammation. Spleen: Normal. Adrenal glands: No nodule. Kidneys: Symmetric renal enhancement and excretion. No hydronephrosis. No perinephric stranding or localizing renal abnormality. Stomach/Bowel: The stomach is decompressed. Proximal small bowel loops are nondistended. There is a moderate length segment of dilated fluid-filled small bowel loops in the lower abdomen/pelvis. Transition point seen in the midline lower pelvis image 60 (better appreciated on sagittal reformats image 90). There is a question of a more proximal transition point or posteriorly in the lower abdomen, image 52, however this is not definitive. Mild adjacent mesenteric edema and small volume of free fluid. No definite bowel wall thickening. No mesenteric swirling. No pneumatosis. Distal small bowel loops are decompressed. Small volume of stool throughout the colon without colonic wall thickening. Portions of the colon are not well defined given bowel dilatation and lack of enteric contrast. Appendix is not visualized. Vascular/Lymphatic: No retroperitoneal adenopathy. Abdominal aorta is normal in caliber. Mild atherosclerosis without aneurysm. Reproductive: Uterus presumably absent. No evidence of adnexal mass. Bladder: Physiologically distended. Other: No free air free fluid, or intra-abdominal fluid collection. Scattered soft tissue granulomas. Musculoskeletal: There are no acute or suspicious osseous abnormalities. Multilevel degenerative change throughout the spine with degenerative disc disease and facet arthropathy. Degenerative-type  anterolisthesis of L4 on L5. IMPRESSION: Small bowel obstruction with transition point in the mid mid lower pelvis. A more proximal second point is questioned but not definitive, and closed loop obstruction is not excluded. However early bowel obstruction is favored.  Electronically Signed   By: Jeb Levering M.D.   On: 04/10/2015 00:15   Dg Abd 2 Views  04/14/2015  CLINICAL DATA:  Small bowel obstruction with emesis and pain EXAM: ABDOMEN - 2 VIEW COMPARISON:  April 13, 2015 FINDINGS: Supine and upright images obtained. There is dilated small bowel in the left mid abdomen. There are multiple air-fluid levels. A small amount of contrast is seen in the colon. No free air is apparent. There are phleboliths in the pelvis. There is moderate air in the stomach. IMPRESSION: The bowel gas pattern is consistent with a degree of obstruction. No free air apparent. Electronically Signed   By: Lowella Grip III M.D.   On: 04/14/2015 15:15   Dg Abd 2 Views  04/13/2015  CLINICAL DATA:  Abdominal pain with nausea and vomiting EXAM: ABDOMEN - 2 VIEW COMPARISON:  04/12/2015 FINDINGS: Stable small bowel dilatation is noted with air-fluid levels. Contrast material is again noted within the colon. No free air is seen. No abnormal mass is noted. Degenerative changes of the lumbar spine are seen. IMPRESSION: Persistent small bowel dilatation stable from the prior exam. Electronically Signed   By: Inez Catalina M.D.   On: 04/13/2015 13:33   Dg Abd Portable 1v  04/14/2015  CLINICAL DATA:  Patient status post NG tube placement. EXAM: PORTABLE ABDOMEN - 1 VIEW COMPARISON:  Abdominal radiograph 04/14/2015. FINDINGS: Enteric tube tip and side-port project over the stomach. Gas is demonstrated within the colon. Oral contrast material within the colon. Lung bases are clear. Lower thoracic and lumbar spine degenerative changes. IMPRESSION: NG tube tip and side-port project over the stomach. Electronically Signed   By: Lovey Newcomer M.D.   On: 04/14/2015 16:48   Dg Abd Portable 1v  04/12/2015  CLINICAL DATA:  Abdominal pain, follow up small bowel obstruction EXAM: PORTABLE ABDOMEN - 1 VIEW COMPARISON:  04/11/2015 FINDINGS: Persistent small bowel dilatation is noted. Contrast material is noted throughout the colon from previous administration. No new focal abnormality is seen. No free air is noted. IMPRESSION: Stable small bowel dilatation. Electronically Signed   By: Inez Catalina M.D.   On: 04/12/2015 09:43   Dg Abd Portable 1v-small Bowel Obstruction Protocol-24 Hr Delay  04/11/2015  CLINICAL DATA:  Small-bowel follow-through, 24 hour delay film EXAM: PORTABLE ABDOMEN - 1 VIEW COMPARISON:  04/09/2015 FINDINGS: Scattered large and small bowel gas is noted. Persistent small bowel dilatation is seen similar to that noted on the recent CT examination. Contrast material is noted throughout the colon. These changes would suggest a partial small bowel obstruction. No free air is seen. No other focal abnormality is noted. IMPRESSION: 24 hour delayed film shows contrast material within the colon indicating a partial small bowel obstruction. Persistent small bowel dilatation is noted. Electronically Signed   By: Inez Catalina M.D.   On: 04/11/2015 07:10         Subjective: Patient denies fevers, chills, headache, chest pain, dyspnea, nausea, vomiting, diarrhea, abdominal pain, dysuria, hematuria. Patient's feeling better since insertion of injury. No further vomiting. However no flatus or bowel movement today.   Objective: Filed Vitals:   04/14/15 2135 04/15/15 0522 04/15/15 0903 04/15/15 1411  BP: 132/76 146/76  139/71  Pulse: 85 84  73  Temp: 98.3 F (36.8 C) 99.1 F (37.3 C)  97.9 F (36.6 C)  TempSrc: Oral Oral  Oral  Resp: '18 18  20  '$ Height:      Weight:      SpO2:  97% 95% 95% 100%    Intake/Output Summary (Last 24 hours) at 04/15/15 1806 Last data filed at 04/15/15 1650  Gross per 24 hour  Intake    140  ml  Output    625 ml  Net   -485 ml   Weight change:  Exam:   General:  Pt is alert, follows commands appropriately, not in acute distress  HEENT: No icterus, No thrush, No neck mass, [place]/AT  Cardiovascular: RRR, S1/S2, no rubs, no gallops  Respiratory: CTA bilaterally, no wheezing, no crackles, no rhonchi  Abdomen: Soft/+BS, non tender, non distended, no guarding  Extremities: No edema, No lymphangitis, No petechiae, No rashes, no synovitis  Data Reviewed: Basic Metabolic Panel:  Recent Labs Lab 04/09/15 2148 04/11/15 0532 04/13/15 0415 04/14/15 0219 04/15/15 0346  NA 138 142 139 137 138  K 3.3* 4.8 4.8 4.2 4.1  CL 103 111 107 104 105  CO2 26 19* 20* 18* 22  GLUCOSE 134* 64* 113* 98 76  BUN '12 10 7 15 20  '$ CREATININE 0.90 0.94 0.91 1.05* 1.07*  CALCIUM 9.7 9.1 9.4 9.7 9.2  MG  --   --   --   --  1.8  PHOS  --   --   --   --  3.2   Liver Function Tests:  Recent Labs Lab 04/09/15 2148  AST 25  ALT 13*  ALKPHOS 75  BILITOT 0.6  PROT 7.2  ALBUMIN 3.6    Recent Labs Lab 04/09/15 2148  LIPASE 25   No results for input(s): AMMONIA in the last 168 hours. CBC:  Recent Labs Lab 04/09/15 2148 04/14/15 0219  WBC 9.4 8.5  HGB 14.1 15.8*  HCT 41.5 44.7  MCV 88.9 88.0  PLT 300 305   Cardiac Enzymes: No results for input(s): CKTOTAL, CKMB, CKMBINDEX, TROPONINI in the last 168 hours. BNP: Invalid input(s): POCBNP CBG: No results for input(s): GLUCAP in the last 168 hours.  No results found for this or any previous visit (from the past 240 hour(s)).   Scheduled Meds: . azelastine  2 spray Each Nare Daily  . bisacodyl  10 mg Rectal Daily  . budesonide-formoterol  2 puff Inhalation Daily  . heparin  5,000 Units Subcutaneous 3 times per day  . metoCLOPramide (REGLAN) injection  10 mg Intravenous 3 times per day  . pantoprazole (PROTONIX) IV  40 mg Intravenous Q24H  . promethazine  12.5 mg Intravenous Once   Continuous Infusions: . sodium chloride  0.9 % 1,000 mL with potassium chloride 20 mEq infusion 75 mL/hr at 04/15/15 0510     Travin Marik, DO  [facility] Pager [phone]  If 7PM-7AM, please contact night-coverage [email or web address] Password [email or web address] 04/15/2015, 6:06 PM   LOS: 5 days

## 2015-04-15 NOTE — Progress Notes (Signed)
  Subjective: Feels better with ng tube in  Objective: Vital signs in last 24 hours: Temp:  [98.3 F (36.8 C)-99.2 F (37.3 C)] 99.1 F (37.3 C) (11/25 0522) Pulse Rate:  [84-108] 84 (11/25 0522) Resp:  [18-20] 18 (11/25 0522) BP: (132-150)/(76-93) 146/76 mmHg (11/25 0522) SpO2:  [95 %-100 %] 95 % (11/25 0522) Last BM Date: 13-May-2015  Intake/Output from previous day: 2023-05-13 0701 - 11/25 0700 In: 498 [P.O.:358; I.V.:140] Out: 2000 [Urine:100; Emesis/NG output:1900] Intake/Output this shift:    Resp: clear to auscultation bilaterally Cardio: regular rate and rhythm GI: soft, minimal tenderness. minimal distension  Lab Results:   Recent Labs  05-13-15 0219  WBC 8.5  HGB 15.8*  HCT 44.7  PLT 305   BMET  Recent Labs  05/13/2015 0219 04/15/15 0346  NA 137 138  K 4.2 4.1  CL 104 105  CO2 18* 22  GLUCOSE 98 76  BUN 15 20  CREATININE 1.05* 1.07*  CALCIUM 9.7 9.2   PT/INR No results for input(s): LABPROT, INR in the last 72 hours. ABG No results for input(s): PHART, HCO3 in the last 72 hours.  Invalid input(s): PCO2, PO2  Studies/Results: Dg Abd 2 Views  2015/05/13  CLINICAL DATA:  Small bowel obstruction with emesis and pain EXAM: ABDOMEN - 2 VIEW COMPARISON:  April 13, 2015 FINDINGS: Supine and upright images obtained. There is dilated small bowel in the left mid abdomen. There are multiple air-fluid levels. A small amount of contrast is seen in the colon. No free air is apparent. There are phleboliths in the pelvis. There is moderate air in the stomach. IMPRESSION: The bowel gas pattern is consistent with a degree of obstruction. No free air apparent. Electronically Signed   By: Lowella Grip III M.D.   On: May 13, 2015 15:15   Dg Abd 2 Views  04/13/2015  CLINICAL DATA:  Abdominal pain with nausea and vomiting EXAM: ABDOMEN - 2 VIEW COMPARISON:  04/12/2015 FINDINGS: Stable small bowel dilatation is noted with air-fluid levels. Contrast material is again  noted within the colon. No free air is seen. No abnormal mass is noted. Degenerative changes of the lumbar spine are seen. IMPRESSION: Persistent small bowel dilatation stable from the prior exam. Electronically Signed   By: Inez Catalina M.D.   On: 04/13/2015 13:33   Dg Abd Portable 1v  05/13/2015  CLINICAL DATA:  Patient status post NG tube placement. EXAM: PORTABLE ABDOMEN - 1 VIEW COMPARISON:  Abdominal radiograph May 13, 2015. FINDINGS: Enteric tube tip and side-port project over the stomach. Gas is demonstrated within the colon. Oral contrast material within the colon. Lung bases are clear. Lower thoracic and lumbar spine degenerative changes. IMPRESSION: NG tube tip and side-port project over the stomach. Electronically Signed   By: Lovey Newcomer M.D.   On: May 13, 2015 16:48    Anti-infectives: Anti-infectives    None      Assessment/Plan: s/p * No surgery found * continue ng and bowel rest  Recheck abd xrays in am  LOS: 5 days    TOTH III,Takeru Bose S 04/15/2015

## 2015-04-16 ENCOUNTER — Inpatient Hospital Stay (HOSPITAL_COMMUNITY): Payer: Commercial Managed Care - HMO

## 2015-04-16 LAB — BASIC METABOLIC PANEL
Anion gap: 11 (ref 5–15)
BUN: 15 mg/dL (ref 6–20)
CO2: 19 mmol/L — ABNORMAL LOW (ref 22–32)
Calcium: 8.8 mg/dL — ABNORMAL LOW (ref 8.9–10.3)
Chloride: 111 mmol/L (ref 101–111)
Creatinine, Ser: 1.01 mg/dL — ABNORMAL HIGH (ref 0.44–1.00)
GFR calc Af Amer: >60 mL/min (ref >60)
GFR calc non Af Amer: 53 mL/min — ABNORMAL LOW (ref >60)
Glucose, Bld: 68 mg/dL (ref 65–99)
Potassium: 3.9 mmol/L (ref 3.5–5.1)
Sodium: 141 mmol/L (ref 135–145)

## 2015-04-16 IMAGING — DX DG ABDOMEN 2V
2 series · 2 of 2 positions shown · non-contrast
Comparison: [DATE] abdominal radiograph

CLINICAL DATA: Enteric tube placement.  Small bowel obstruction.

EXAM:
ABDOMEN - 2 VIEW

[abdomen erect]
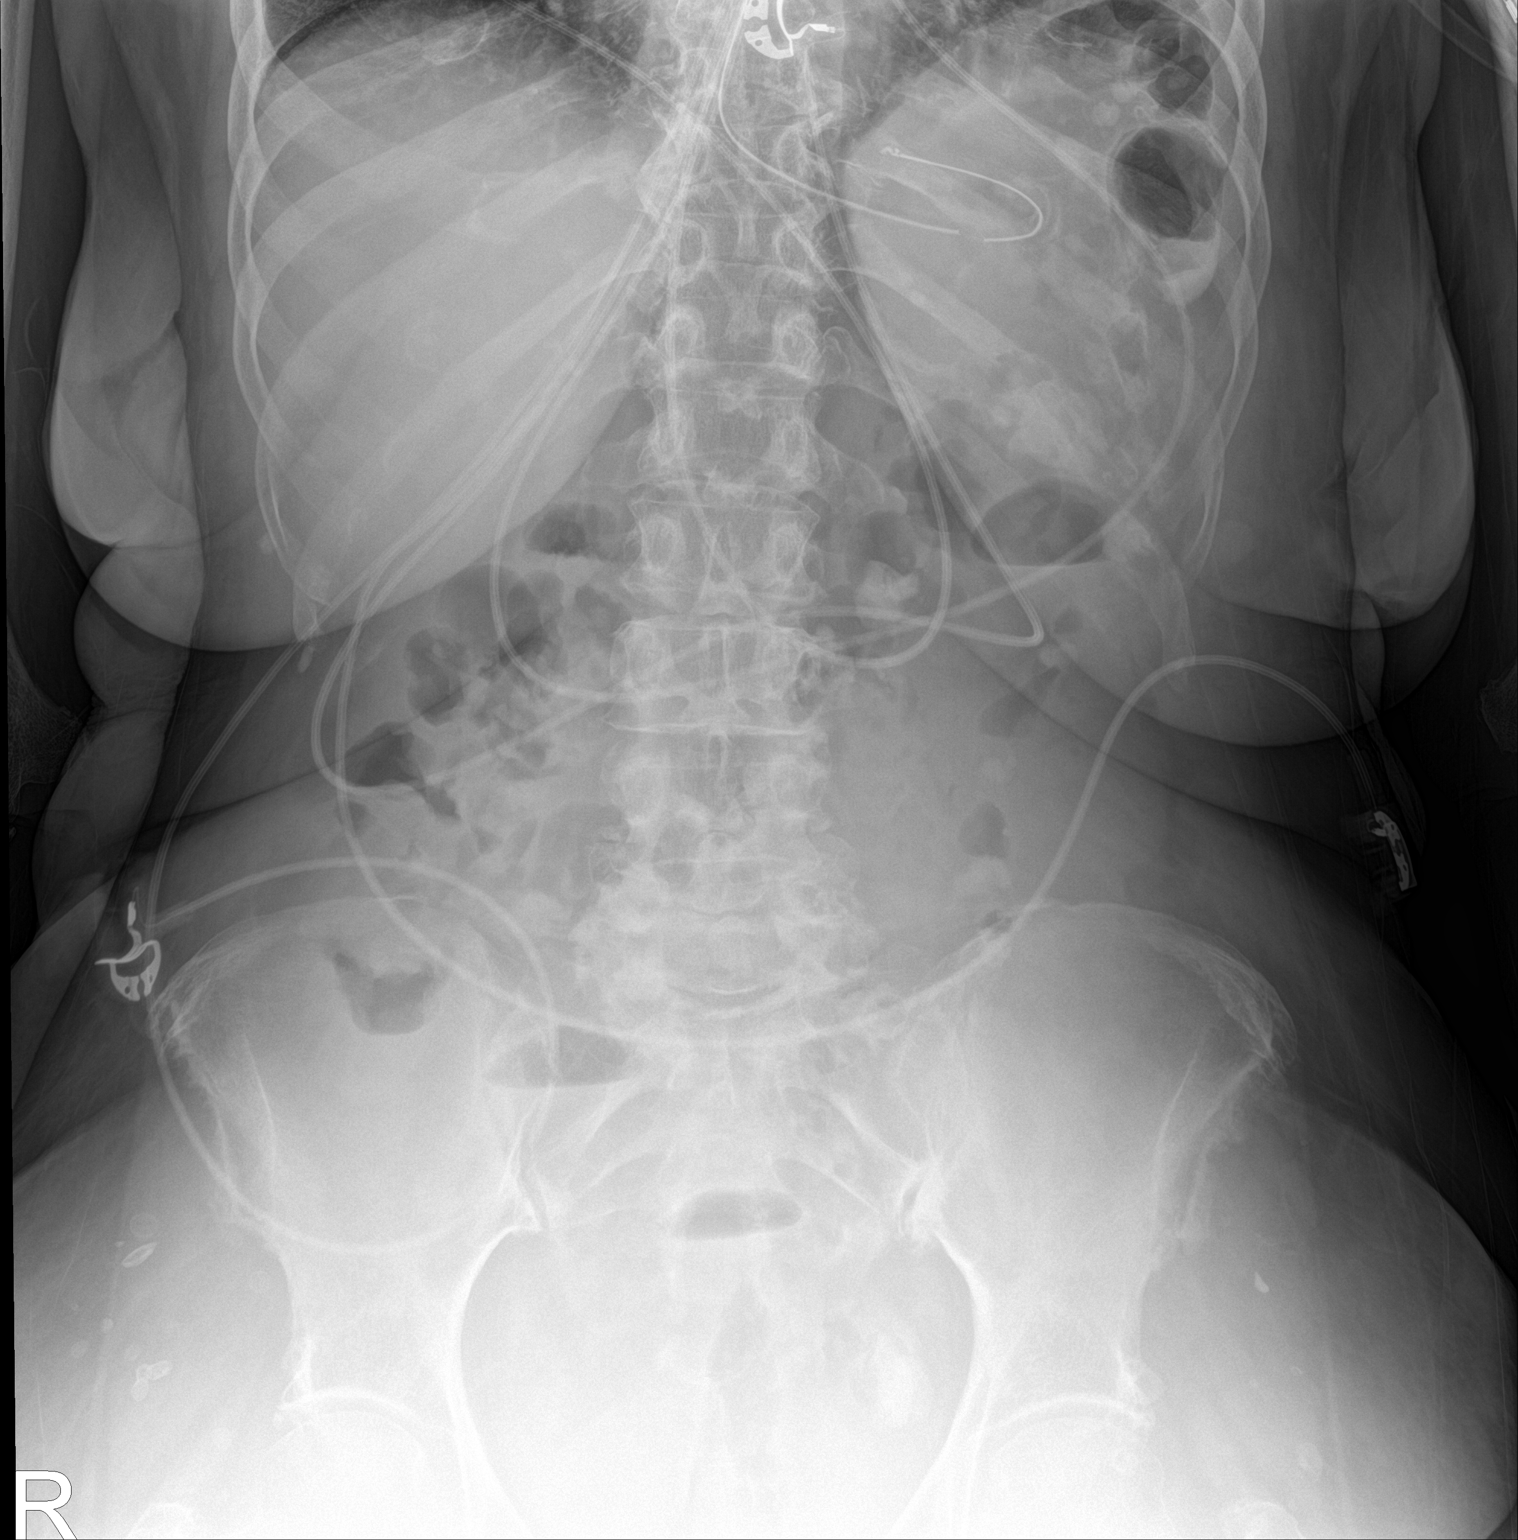

[abdomen supine]
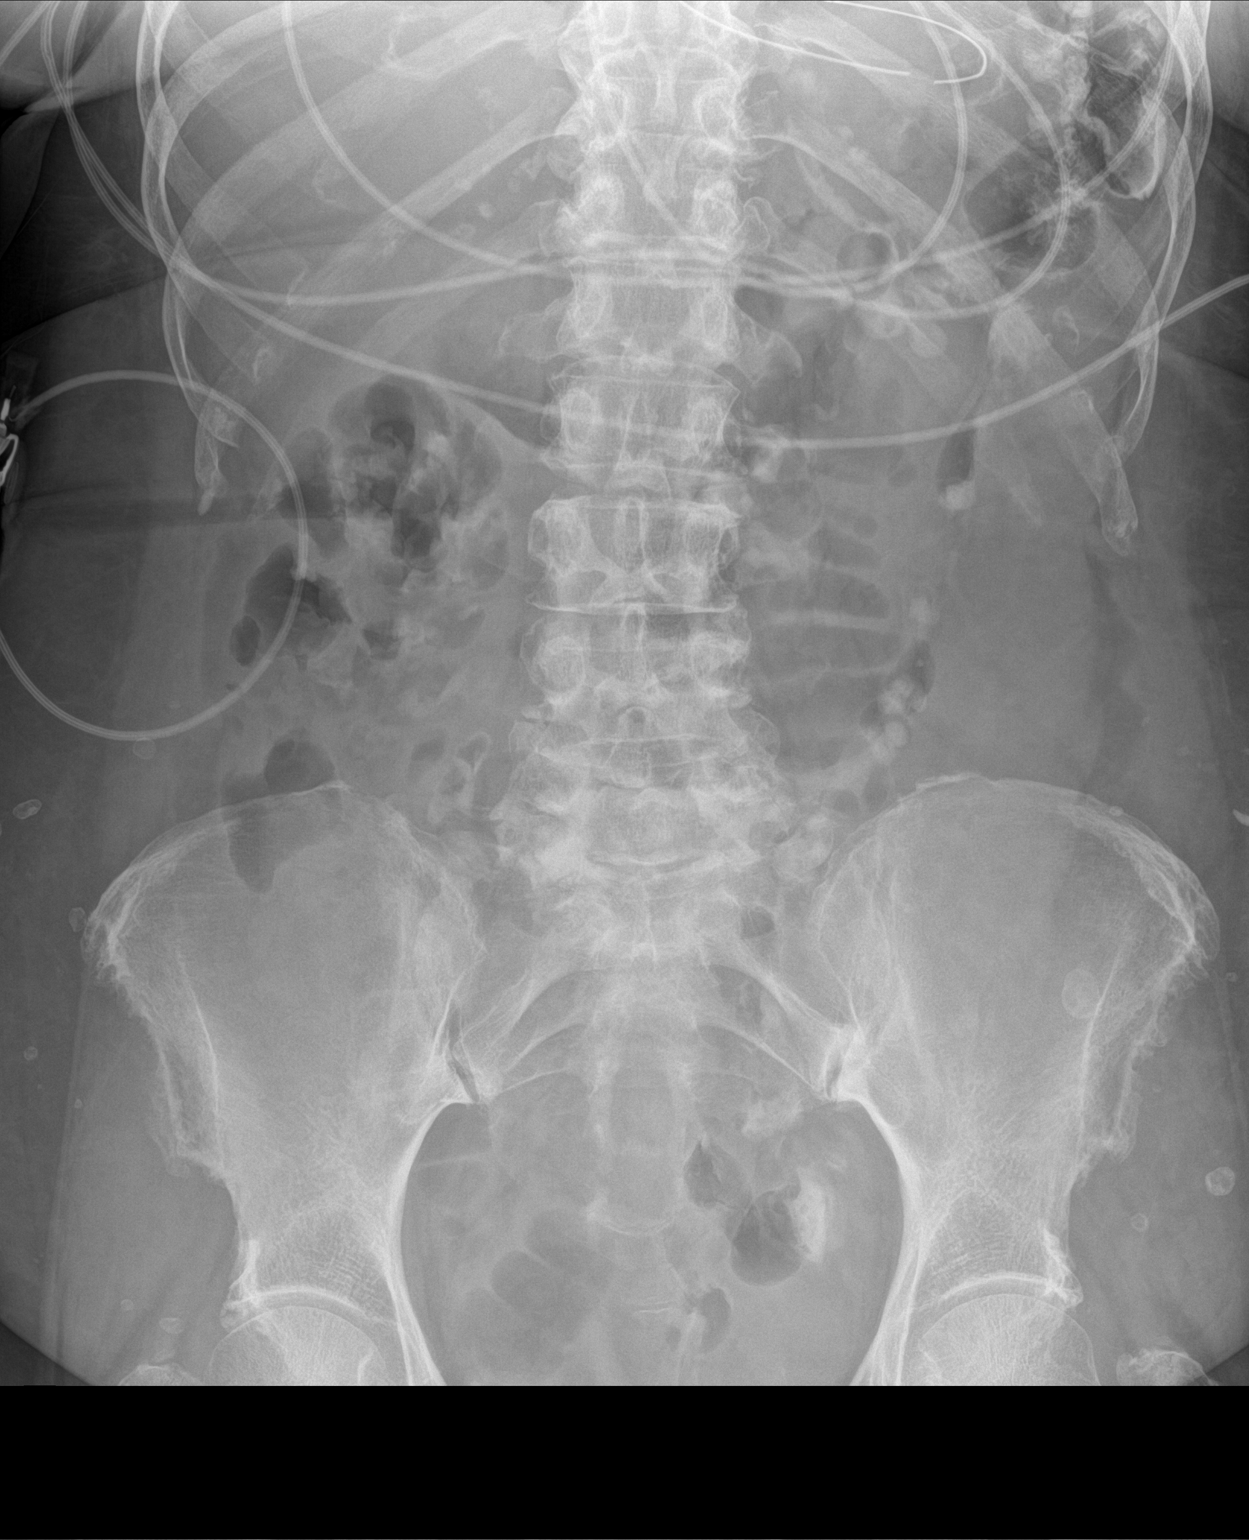

[2 of 2 positions shown; findings below may reference images not displayed]

FINDINGS: Enteric tube loops in the proximal stomach and terminates in the
proximal stomach, likely in the gastric fundus. There a few mildly
dilated small bowel loops in the central and right lower abdomen,
not appreciably changed. Mild colonic stool. No evidence of
pneumatosis or pneumoperitoneum. There is mild retained oral
contrast in the colon.
IMPRESSION: 1. Enteric tube loops in and terminates in the proximal stomach,
likely in the gastric fundus.
2. Mildly dilated central and right lower abdominal small bowel
loops, not appreciably changed, suggesting ongoing mild partial
small-bowel obstruction.

## 2015-04-16 NOTE — Progress Notes (Signed)
PROGRESS NOTE  Angelica [name] HKV:[id number] DOB: 10-26-1940 DOA: 04/09/2015 PCP: Foye Spurling, MD  Brief narrative 74 year old female with hypertension, GERD, chronic abdominal pain (sees Dr. Watt Climes), history of hysterectomy presented to the ED with several days of abdominal pain with nausea and vomiting of clear source and constipated since past 2 days. Patient was found to have minimal abdominal tenderness on exam with CT of the abdomen showing small bowel obstruction with transition point in the mid lower pelvis. Admit to medical floor and surgery consulted. The patient was ultimately started on a clear liquid diet. Unfortunately, she continued to have vomiting. Abdominal x-ray on 04/13/2015 revealed persistent small bowel dilatation. As result, NG tube will be placed to low intermittent suction   Assessment/Plan: Partial small bowel obstruction -Possibly secondary to adhesions from prior surgery. . - IV hydration. Keep K >4.  -CCS following.  -Continue supportive care with when necessary pain medications and antiemetics. Minimize opioids -continue scheduled reglan and bisacodyl. -place NG to LIS-->no further vomiting-- -encouraged ambulation -Remain NPO -switch essential meds to IV if possible -Continue IVF -04/16/15--AXR--minimal improvement of SB dilatation  Hypokalemia Replenished Keep K >4  Essential Hypertension Stable. Monitor on when necessary hydralazine  COPD -stable on RA -continue symbicort  Diet: NPO    Procedures/Studies: Ct Abdomen Pelvis W Contrast  04/10/2015  CLINICAL DATA:  Nausea and vomiting today. Lower abdominal pain. History of abdominal surgeries. EXAM: CT ABDOMEN AND PELVIS WITH CONTRAST TECHNIQUE: Multidetector CT imaging of the abdomen and pelvis was performed using the standard protocol following bolus administration of intravenous contrast. CONTRAST:  32m OMNIPAQUE IOHEXOL 300 MG/ML  SOLN COMPARISON:  CT 02/17/2014 FINDINGS:  Lower chest:  The included lung bases are clear. Liver: No focal hepatic lesion. Hepatobiliary: Gallbladder physiologically distended. Small wall calcification is unchanged, no calcified gallstone. No biliary dilatation. Pancreas: No ductal dilatation or inflammation. Spleen: Normal. Adrenal glands: No nodule. Kidneys: Symmetric renal enhancement and excretion. No hydronephrosis. No perinephric stranding or localizing renal abnormality. Stomach/Bowel: The stomach is decompressed. Proximal small bowel loops are nondistended. There is a moderate length segment of dilated fluid-filled small bowel loops in the lower abdomen/pelvis. Transition point seen in the midline lower pelvis image 60 (better appreciated on sagittal reformats image 90). There is a question of a more proximal transition point or posteriorly in the lower abdomen, image 52, however this is not definitive. Mild adjacent mesenteric edema and small volume of free fluid. No definite bowel wall thickening. No mesenteric swirling. No pneumatosis. Distal small bowel loops are decompressed. Small volume of stool throughout the colon without colonic wall thickening. Portions of the colon are not well defined given bowel dilatation and lack of enteric contrast. Appendix is not visualized. Vascular/Lymphatic: No retroperitoneal adenopathy. Abdominal aorta is normal in caliber. Mild atherosclerosis without aneurysm. Reproductive: Uterus presumably absent. No evidence of adnexal mass. Bladder: Physiologically distended. Other: No free air free fluid, or intra-abdominal fluid collection. Scattered soft tissue granulomas. Musculoskeletal: There are no acute or suspicious osseous abnormalities. Multilevel degenerative change throughout the spine with degenerative disc disease and facet arthropathy. 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 IMPRESSION: Small bowel obstruction with transition point in the mid mid lower pelvis. A more proximal second point is  questioned but not definitive, and closed loop obstruction is not excluded. However early bowel obstruction is favored. Electronically Signed   By: MJeb LeveringM.D.   On: 04/10/2015 00:15   Dg Abd 2 Views  04/16/2015  CLINICAL DATA:  Enteric tube placement.  Small bowel obstruction. EXAM: ABDOMEN - 2 VIEW COMPARISON:  04/14/2015 abdominal radiograph FINDINGS: Enteric tube loops in the proximal stomach and terminates in the proximal stomach, likely in the gastric fundus. There a few mildly dilated small bowel loops in the central and right lower abdomen, not appreciably changed. Mild colonic stool. No evidence of pneumatosis or pneumoperitoneum. There is mild retained oral contrast in the colon. IMPRESSION: 1. Enteric tube loops in and terminates in the proximal stomach, likely in the gastric fundus. 2. Mildly dilated central and right lower abdominal small bowel loops, not appreciably changed, suggesting ongoing mild partial small-bowel obstruction. Electronically Signed   By: Ilona Sorrel M.D.   On: 04/16/2015 09:01   Dg Abd 2 Views  04/14/2015  CLINICAL DATA:  Small bowel obstruction with emesis and pain EXAM: ABDOMEN - 2 VIEW COMPARISON:  April 13, 2015 FINDINGS: Supine and upright images obtained. There is dilated small bowel in the left mid abdomen. There are multiple air-fluid levels. A small amount of contrast is seen in the colon. No free air is apparent. There are phleboliths in the pelvis. There is moderate air in the stomach. IMPRESSION: The bowel gas pattern is consistent with a degree of obstruction. No free air apparent. Electronically Signed   By: Lowella Grip III M.D.   On: 04/14/2015 15:15   Dg Abd 2 Views  04/13/2015  CLINICAL DATA:  Abdominal pain with nausea and vomiting EXAM: ABDOMEN - 2 VIEW COMPARISON:  04/12/2015 FINDINGS: Stable small bowel dilatation is noted with air-fluid levels. Contrast material is again noted within the colon. No free air is seen. No abnormal  mass is noted. Degenerative changes of the lumbar spine are seen. IMPRESSION: Persistent small bowel dilatation stable from the prior exam. Electronically Signed   By: Inez Catalina M.D.   On: 04/13/2015 13:33   Dg Abd Portable 1v  04/14/2015  CLINICAL DATA:  Patient status post NG tube placement. EXAM: PORTABLE ABDOMEN - 1 VIEW COMPARISON:  Abdominal radiograph 04/14/2015. FINDINGS: Enteric tube tip and side-port project over the stomach. Gas is demonstrated within the colon. Oral contrast material within the colon. Lung bases are clear. Lower thoracic and lumbar spine degenerative changes. IMPRESSION: NG tube tip and side-port project over the stomach. Electronically Signed   By: Lovey Newcomer M.D.   On: 04/14/2015 16:48   Dg Abd Portable 1v  04/12/2015  CLINICAL DATA:  Abdominal pain, follow up small bowel obstruction EXAM: PORTABLE ABDOMEN - 1 VIEW COMPARISON:  04/11/2015 FINDINGS: Persistent small bowel dilatation is noted. Contrast material is noted throughout the colon from previous administration. No new focal abnormality is seen. No free air is noted. IMPRESSION: Stable small bowel dilatation. Electronically Signed   By: Inez Catalina M.D.   On: 04/12/2015 09:43   Dg Abd Portable 1v-small Bowel Obstruction Protocol-24 Hr Delay  04/11/2015  CLINICAL DATA:  Small-bowel follow-through, 24 hour delay film EXAM: PORTABLE ABDOMEN - 1 VIEW COMPARISON:  04/09/2015 FINDINGS: Scattered large and small bowel gas is noted. Persistent small bowel dilatation is seen similar to that noted on the recent CT examination. Contrast material is noted throughout the colon. These changes would suggest a partial small bowel obstruction. No free air is seen. No other focal abnormality is noted. IMPRESSION: 24 hour delayed film shows contrast material within the colon indicating a partial small bowel obstruction. Persistent small bowel dilatation is noted. Electronically Signed   By: Linus Mako.D.  On: 04/11/2015 07:10           Subjective: Patient has intermittent abdominal cramping but overall feeling better. Has had a bowel movement. No vomiting or fevers or chills or chest pain or shortness breath. Denies any headaches.  Objective: Filed Vitals:   04/15/15 2113 04/16/15 0630 04/16/15 0748 04/16/15 1425  BP: 131/64 136/69  175/88  Pulse: 73 73  82  Temp: 98.4 F (36.9 C) 98.3 F (36.8 C)  97.8 F (36.6 C)  TempSrc: Oral Oral  Oral  Resp: '18 18  18  '$ Height:      Weight:      SpO2: 100% 100% 100% 100%    Intake/Output Summary (Last 24 hours) at 04/16/15 1728 Last data filed at 04/16/15 1432  Gross per 24 hour  Intake   1805 ml  Output    750 ml  Net   1055 ml   Weight change:  Exam:   General:  Pt is alert, follows commands appropriately, not in acute distress  HEENT: No icterus, No thrush, No neck mass, [place]/AT  Cardiovascular: RRR, S1/S2, no rubs, no gallops  Respiratory: CTA bilaterally, no wheezing, no crackles, no rhonchi  Abdomen: Soft/+BS, non tender, non distended, no guarding  Extremities: No edema, No lymphangitis, No petechiae, No rashes, no synovitis  Data Reviewed: Basic Metabolic Panel:  Recent Labs Lab 04/11/15 0532 04/13/15 0415 04/14/15 0219 04/15/15 0346 04/16/15 0600  NA 142 139 137 138 141  K 4.8 4.8 4.2 4.1 3.9  CL 111 107 104 105 111  CO2 19* 20* 18* 22 19*  GLUCOSE 64* 113* 98 76 68  BUN '10 7 15 20 15  '$ CREATININE 0.94 0.91 1.05* 1.07* 1.01*  CALCIUM 9.1 9.4 9.7 9.2 8.8*  MG  --   --   --  1.8  --   PHOS  --   --   --  3.2  --    Liver Function Tests:  Recent Labs Lab 04/09/15 2148  AST 25  ALT 13*  ALKPHOS 75  BILITOT 0.6  PROT 7.2  ALBUMIN 3.6    Recent Labs Lab 04/09/15 2148  LIPASE 25   No results for input(s): AMMONIA in the last 168 hours. CBC:  Recent Labs Lab 04/09/15 2148 04/14/15 0219  WBC 9.4 8.5  HGB 14.1 15.8*  HCT 41.5 44.7  MCV 88.9 88.0  PLT 300 305   Cardiac Enzymes: No results for input(s):  CKTOTAL, CKMB, CKMBINDEX, TROPONINI in the last 168 hours. BNP: Invalid input(s): POCBNP CBG: No results for input(s): GLUCAP in the last 168 hours.  No results found for this or any previous visit (from the past 240 hour(s)).   Scheduled Meds: . azelastine  2 spray Each Nare Daily  . bisacodyl  10 mg Rectal Daily  . budesonide-formoterol  2 puff Inhalation Daily  . heparin  5,000 Units Subcutaneous 3 times per day  . metoCLOPramide (REGLAN) injection  10 mg Intravenous 3 times per day  . pantoprazole (PROTONIX) IV  40 mg Intravenous Q24H  . promethazine  12.5 mg Intravenous Once   Continuous Infusions: . sodium chloride 0.9 % 1,000 mL with potassium chloride 20 mEq infusion 75 mL/hr at 04/16/15 0820     Airabella Barley, DO  [facility] Pager [phone]  If 7PM-7AM, please contact night-coverage [email or web address] Password [email or web address] 04/16/2015, 5:28 PM   LOS: 6 days

## 2015-04-16 NOTE — Progress Notes (Signed)
  Subjective: Feels good with ng in.  Objective: Vital signs in last 24 hours: Temp:  [97.9 F (36.6 C)-98.4 F (36.9 C)] 98.3 F (36.8 C) (11/26 0630) Pulse Rate:  [73] 73 (11/26 0630) Resp:  [18-20] 18 (11/26 0630) BP: (131-139)/(64-71) 136/69 mmHg (11/26 0630) SpO2:  [95 %-100 %] 100 % (11/26 0748) Last BM Date: 04/15/15  Intake/Output from previous day: 11/25 0701 - 11/26 0700 In: 1805 [I.V.:1805] Out: 725 [Urine:125; Emesis/NG output:600] Intake/Output this shift:    Resp: clear to auscultation bilaterally Cardio: regular rate and rhythm GI: soft, nontender. nondistended  Lab Results:   Recent Labs  2015-04-16 0219  WBC 8.5  HGB 15.8*  HCT 44.7  PLT 305   BMET  Recent Labs  04/15/15 0346 04/16/15 0600  NA 138 141  K 4.1 3.9  CL 105 111  CO2 22 19*  GLUCOSE 76 68  BUN 20 15  CREATININE 1.07* 1.01*  CALCIUM 9.2 8.8*   PT/INR No results for input(s): LABPROT, INR in the last 72 hours. ABG No results for input(s): PHART, HCO3 in the last 72 hours.  Invalid input(s): PCO2, PO2  Studies/Results: Dg Abd 2 Views  2015-04-16  CLINICAL DATA:  Small bowel obstruction with emesis and pain EXAM: ABDOMEN - 2 VIEW COMPARISON:  April 13, 2015 FINDINGS: Supine and upright images obtained. There is dilated small bowel in the left mid abdomen. There are multiple air-fluid levels. A small amount of contrast is seen in the colon. No free air is apparent. There are phleboliths in the pelvis. There is moderate air in the stomach. IMPRESSION: The bowel gas pattern is consistent with a degree of obstruction. No free air apparent. Electronically Signed   By: Lowella Grip III M.D.   On: Apr 16, 2015 15:15   Dg Abd Portable 1v  Apr 16, 2015  CLINICAL DATA:  Patient status post NG tube placement. EXAM: PORTABLE ABDOMEN - 1 VIEW COMPARISON:  Abdominal radiograph Apr 16, 2015. FINDINGS: Enteric tube tip and side-port project over the stomach. Gas is demonstrated within the  colon. Oral contrast material within the colon. Lung bases are clear. Lower thoracic and lumbar spine degenerative changes. IMPRESSION: NG tube tip and side-port project over the stomach. Electronically Signed   By: Lovey Newcomer M.D.   On: 04/16/2015 16:48    Anti-infectives: Anti-infectives    None      Assessment/Plan: s/p * No surgery found * Continue ng and bowel rest. Abd xrays look improved today Ambulate If she continues to do well then may consider clamping ng tomorrow  LOS: 6 days    TOTH III,Brinsley Wence S 04/16/2015

## 2015-04-17 ENCOUNTER — Inpatient Hospital Stay (HOSPITAL_COMMUNITY): Payer: Commercial Managed Care - HMO

## 2015-04-17 DIAGNOSIS — E162 Hypoglycemia, unspecified: Secondary | ICD-10-CM

## 2015-04-17 LAB — BASIC METABOLIC PANEL
Anion gap: 13 (ref 5–15)
BUN: 11 mg/dL (ref 6–20)
CO2: 18 mmol/L — ABNORMAL LOW (ref 22–32)
Calcium: 8.9 mg/dL (ref 8.9–10.3)
Chloride: 110 mmol/L (ref 101–111)
Creatinine, Ser: 0.91 mg/dL (ref 0.44–1.00)
GFR calc Af Amer: >60 mL/min (ref >60)
GFR calc non Af Amer: >60 mL/min (ref >60)
Glucose, Bld: 50 mg/dL — ABNORMAL LOW (ref 65–99)
Potassium: 4.8 mmol/L (ref 3.5–5.1)
Sodium: 141 mmol/L (ref 135–145)

## 2015-04-17 LAB — GLUCOSE, CAPILLARY
Glucose-Capillary: 51 mg/dL — ABNORMAL LOW (ref 65–99)
Glucose-Capillary: 134 mg/dL — ABNORMAL HIGH (ref 65–99)
Glucose-Capillary: 102 mg/dL — ABNORMAL HIGH (ref 65–99)
Glucose-Capillary: 60 mg/dL — ABNORMAL LOW (ref 65–99)
Glucose-Capillary: 96 mg/dL (ref 65–99)

## 2015-04-17 LAB — CBC
HCT: 37.8 % (ref 36.0–46.0)
Hemoglobin: 12.5 g/dL (ref 12.0–15.0)
MCH: 30 pg (ref 26.0–34.0)
MCHC: 33.1 g/dL (ref 30.0–36.0)
MCV: 90.6 fL (ref 78.0–100.0)
Platelets: 251 10*3/uL (ref 150–400)
RBC: 4.17 MIL/uL (ref 3.87–5.11)
RDW: 14.3 % (ref 11.5–15.5)
WBC: 10.2 10*3/uL (ref 4.0–10.5)

## 2015-04-17 IMAGING — DX DG ABDOMEN 2V
2 series · 2 of 2 positions shown · non-contrast
Comparison: [DATE] and [DATE]

CLINICAL DATA: Small bowel obstruction.

EXAM:
ABDOMEN - 2 VIEW

[abdomen erect]
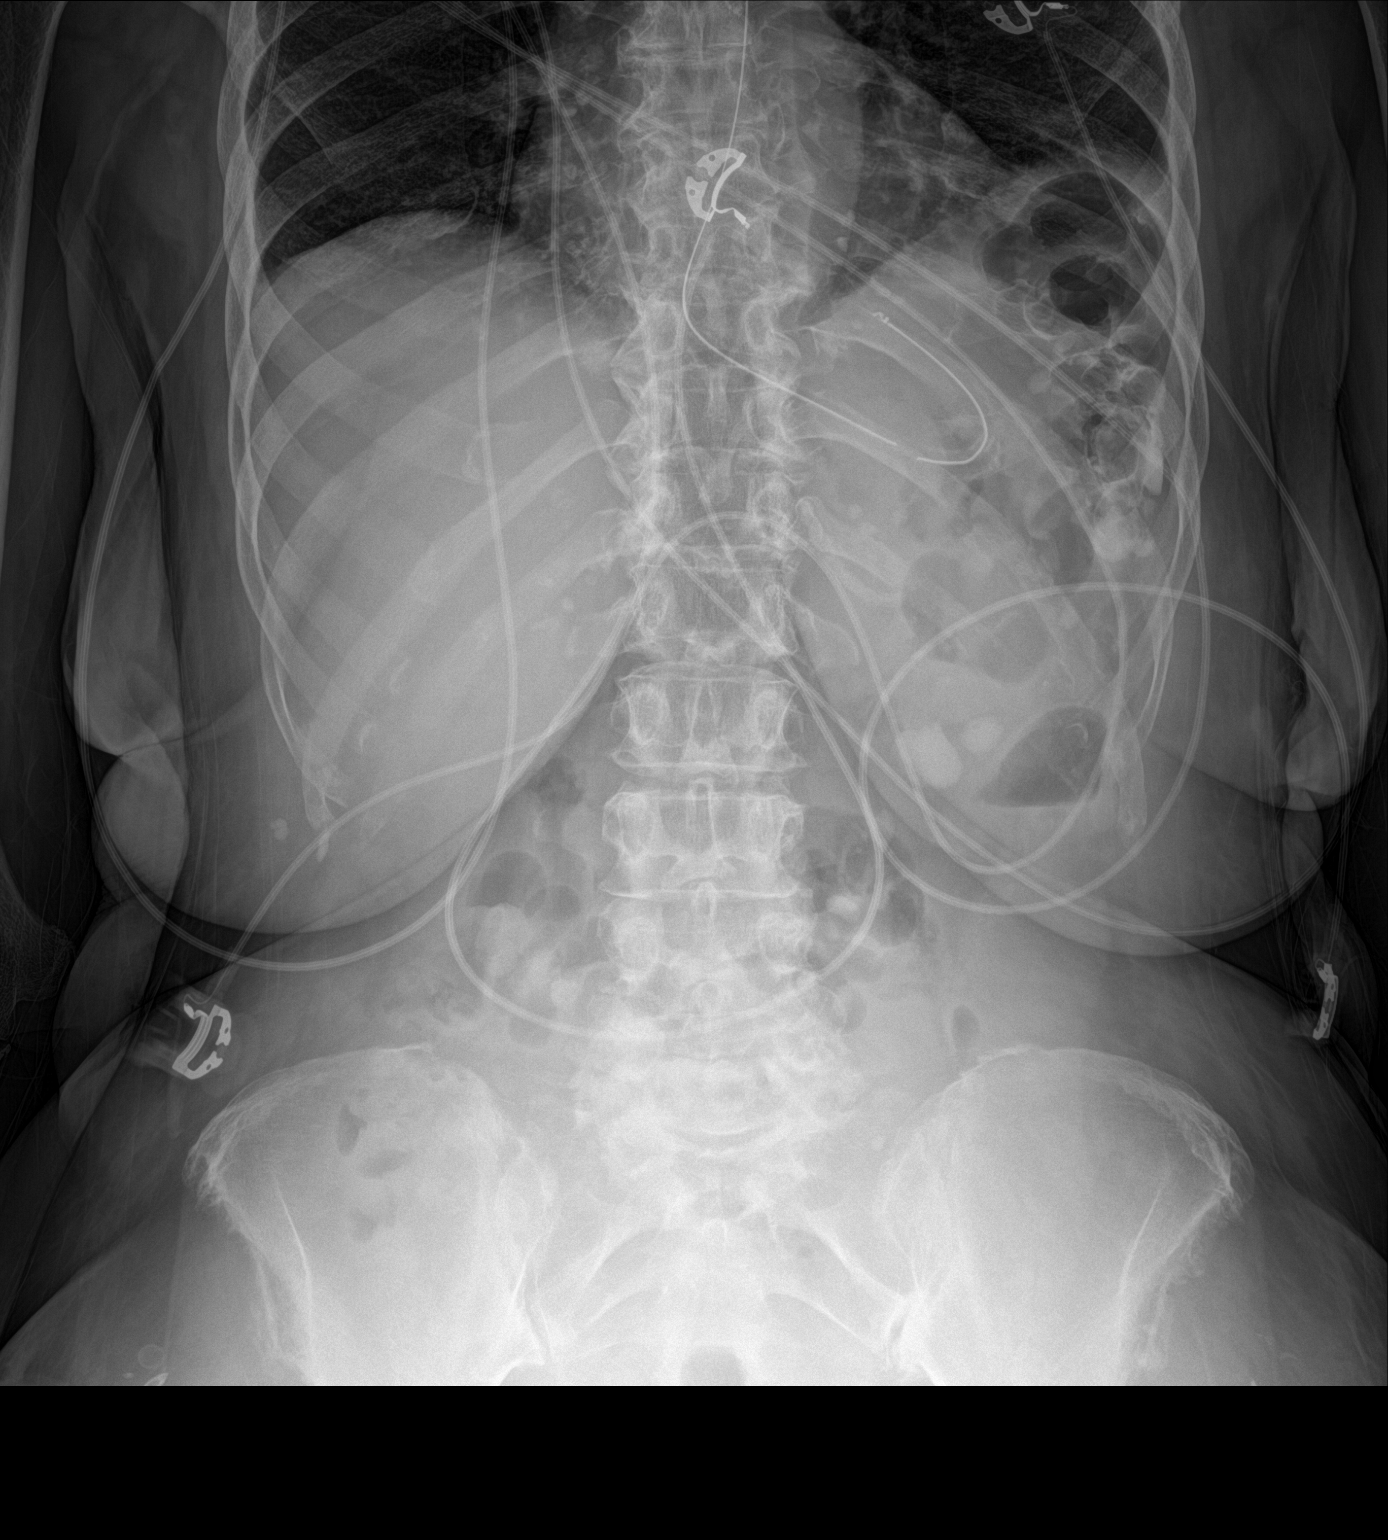

[abdomen supine]
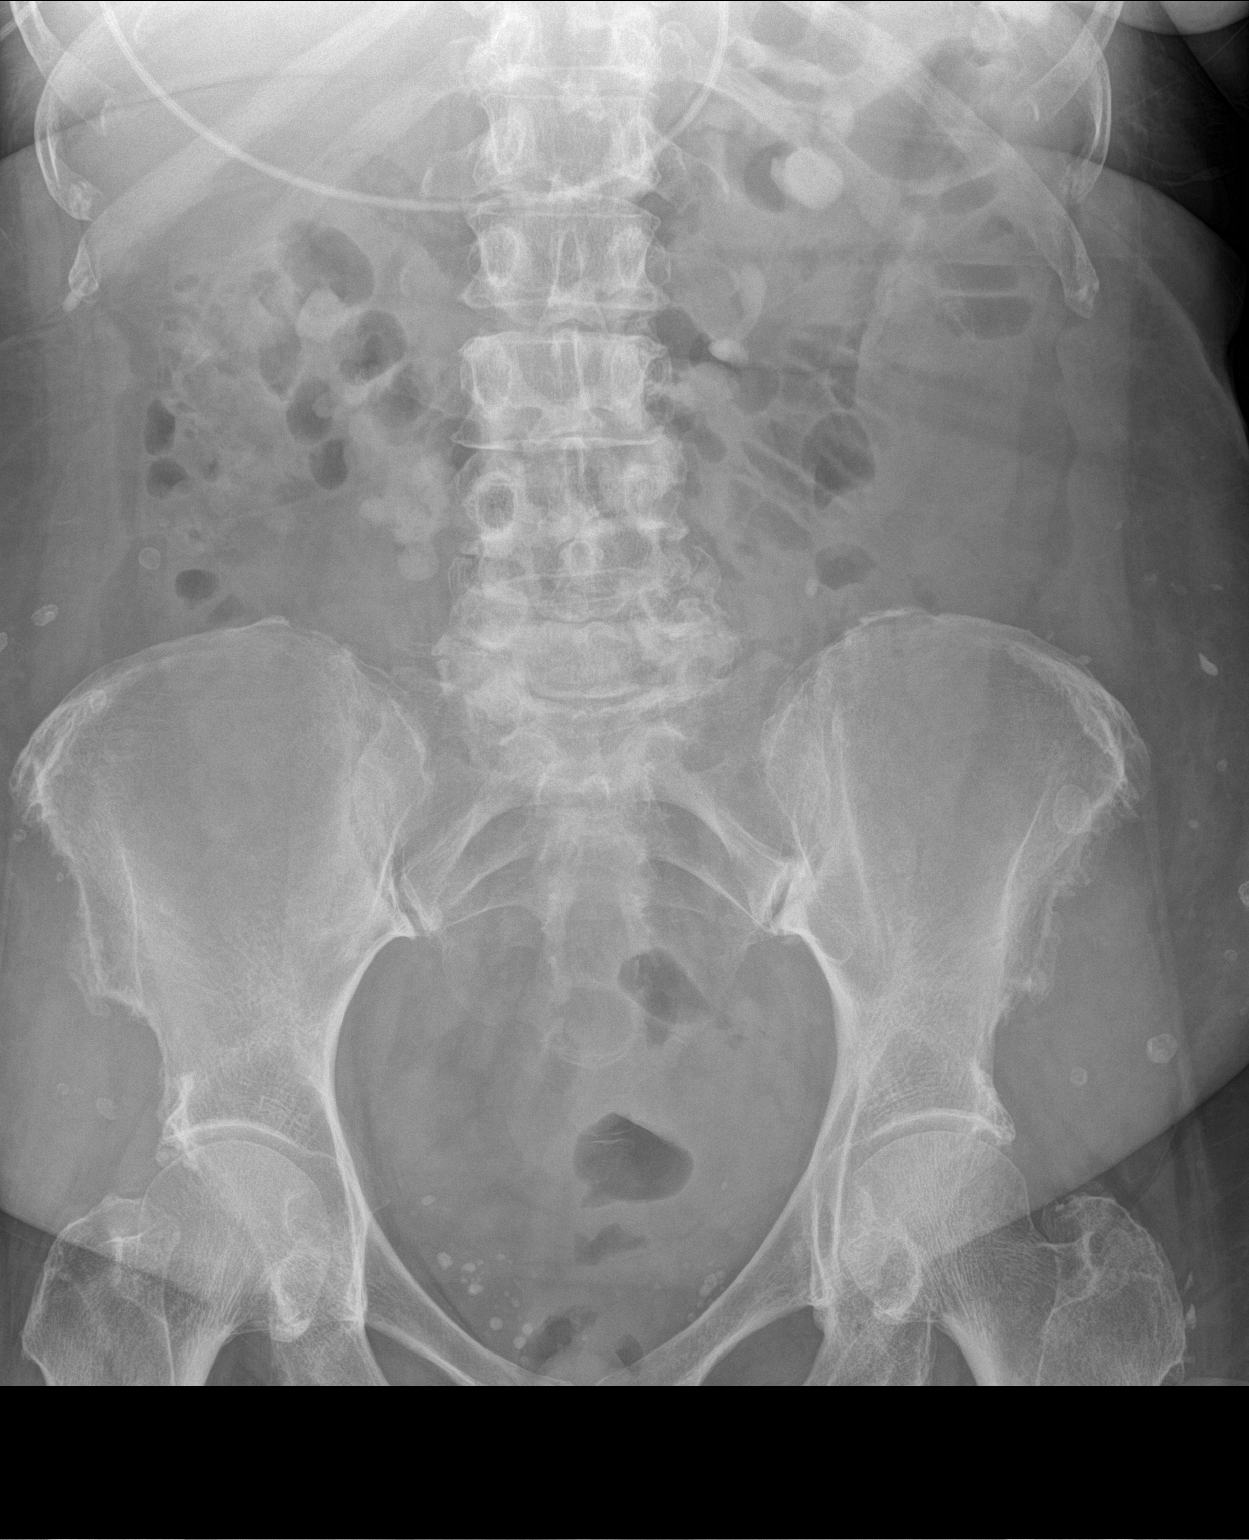

[2 of 2 positions shown; findings below may reference images not displayed]

FINDINGS: Nasogastric tube unchanged with tip and side-port over the stomach
in the left upper quadrant likely in the gastric fundus. Bowel gas
pattern is improved as air is present throughout the colon. Single
air-filled minimally dilated small bowel loop in the left mid
abdomen improved. No evidence of free peritoneal air. Remainder of
the exam is unchanged.
IMPRESSION: Continued improvement in bowel gas pattern with only single mildly
dilated air-filled loop of small bowel in the left mid abdomen. Air
throughout the colon.

Nasogastric tube unchanged with tip and side-port over the gastric
fundus.

## 2015-04-17 MED ORDER — DEXTROSE 50 % IV SOLN
25.0000 mL | Freq: Once | INTRAVENOUS | Status: AC
  Administered 2015-04-17: 25 mL via INTRAVENOUS

## 2015-04-17 MED ORDER — POTASSIUM CHLORIDE 2 MEQ/ML IV SOLN
INTRAVENOUS | Status: DC
  Administered 2015-04-17 – 2015-04-24 (×10): via INTRAVENOUS
  Filled 2015-04-17 (×17): qty 1000

## 2015-04-17 MED ORDER — DEXTROSE 50 % IV SOLN
INTRAVENOUS | Status: AC
  Filled 2015-04-17: qty 50

## 2015-04-17 MED ORDER — DEXTROSE 50 % IV SOLN
1.0000 | Freq: Once | INTRAVENOUS | Status: AC
  Administered 2015-04-17: 50 mL via INTRAVENOUS

## 2015-04-17 NOTE — Progress Notes (Addendum)
PROGRESS NOTE  Angelica [name] WJX:[id number] DOB: [birth year] DOA: 04/09/2015 PCP: Foye Spurling, MD  Brief narrative 74 year old female with hypertension, GERD, chronic abdominal pain (sees Dr. Watt Climes), history of hysterectomy presented to the ED with several days of abdominal pain with nausea and vomiting of clear source and constipated since past 2 days. Patient was found to have minimal abdominal tenderness on exam with CT of the abdomen showing small bowel obstruction with transition point in the mid lower pelvis. Admit to medical floor and surgery consulted. The patient was ultimately started on a clear liquid diet. Unfortunately, she continued to have vomiting. Abdominal x-ray on 04/13/2015 revealed persistent small bowel dilatation. As result, NG tube was placed to low intermittent suction   Assessment/Plan: Partial small bowel obstruction -Possibly secondary to adhesions from prior surgery. . - IV hydration. Keep K >4.  -CCS following.  -Continue supportive care with when necessary pain medications and antiemetics. Minimize opioids -continue scheduled reglan and bisacodyl. -11/23--place NG to LIS-->no further vomiting -11/27--clamp NG tube -encouraged ambulation -Remain NPO -switch essential meds to IV if possible -Continue IVF -04/17/15--AXR--continued improvement of bowel gas pattern   Hypoglycemia -due to npo status -CBG checks q 4 hours -change to D5NS -am cortisol  Hypokalemia Replenished Keep K >4  Essential Hypertension Stable. Monitor on when necessary hydralazine  COPD -stable on RA -continue symbicort  Procedures/Studies: Ct Abdomen Pelvis W Contrast  04/10/2015  CLINICAL DATA:  Nausea and vomiting today. Lower abdominal pain. History of abdominal surgeries. EXAM: CT ABDOMEN AND PELVIS WITH CONTRAST TECHNIQUE: Multidetector CT imaging of the abdomen and pelvis was performed using the standard protocol following bolus administration of  intravenous contrast. CONTRAST:  71m OMNIPAQUE IOHEXOL 300 MG/ML  SOLN COMPARISON:  CT 02/17/2014 FINDINGS: Lower chest:  The included lung bases are clear. Liver: No focal hepatic lesion. Hepatobiliary: Gallbladder physiologically distended. Small wall calcification is unchanged, no calcified gallstone. No biliary dilatation. Pancreas: No ductal dilatation or inflammation. Spleen: Normal. Adrenal glands: No nodule. Kidneys: Symmetric renal enhancement and excretion. No hydronephrosis. No perinephric stranding or localizing renal abnormality. Stomach/Bowel: The stomach is decompressed. Proximal small bowel loops are nondistended. There is a moderate length segment of dilated fluid-filled small bowel loops in the lower abdomen/pelvis. Transition point seen in the midline lower pelvis image 60 (better appreciated on sagittal reformats image 90). There is a question of a more proximal transition point or posteriorly in the lower abdomen, image 52, however this is not definitive. Mild adjacent mesenteric edema and small volume of free fluid. No definite bowel wall thickening. No mesenteric swirling. No pneumatosis. Distal small bowel loops are decompressed. Small volume of stool throughout the colon without colonic wall thickening. Portions of the colon are not well defined given bowel dilatation and lack of enteric contrast. Appendix is not visualized. Vascular/Lymphatic: No retroperitoneal adenopathy. Abdominal aorta is normal in caliber. Mild atherosclerosis without aneurysm. Reproductive: Uterus presumably absent. No evidence of adnexal mass. Bladder: Physiologically distended. Other: No free air free fluid, or intra-abdominal fluid collection. Scattered soft tissue granulomas. Musculoskeletal: There are no acute or suspicious osseous abnormalities. Multilevel degenerative change throughout the spine with degenerative disc disease and facet arthropathy. 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 IMPRESSION:  Small bowel obstruction with transition point in the mid mid lower pelvis. A more proximal second point is questioned but not definitive, and closed loop obstruction is not excluded. However early bowel obstruction is favored. Electronically Signed   By: MThreasa Beards  Ehinger M.D.   On: 04/10/2015 00:15   Dg Abd 2 Views  04/17/2015  CLINICAL DATA:  Small bowel obstruction. EXAM: ABDOMEN - 2 VIEW COMPARISON:  04/16/2015 and 04/14/2015 FINDINGS: Nasogastric tube unchanged with tip and side-port over the stomach in the left upper quadrant likely in the gastric fundus. Bowel gas pattern is improved as air is present throughout the colon. Single air-filled minimally dilated small bowel loop in the left mid abdomen improved. No evidence of free peritoneal air. Remainder of the exam is unchanged. IMPRESSION: Continued improvement in bowel gas pattern with only single mildly dilated air-filled loop of small bowel in the left mid abdomen. Air throughout the colon. Nasogastric tube unchanged with tip and side-port over the gastric fundus. Electronically Signed   By: Marin Olp M.D.   On: 04/17/2015 09:38   Dg Abd 2 Views  04/16/2015  CLINICAL DATA:  Enteric tube placement.  Small bowel obstruction. EXAM: ABDOMEN - 2 VIEW COMPARISON:  04/14/2015 abdominal radiograph FINDINGS: Enteric tube loops in the proximal stomach and terminates in the proximal stomach, likely in the gastric fundus. There a few mildly dilated small bowel loops in the central and right lower abdomen, not appreciably changed. Mild colonic stool. No evidence of pneumatosis or pneumoperitoneum. There is mild retained oral contrast in the colon. IMPRESSION: 1. Enteric tube loops in and terminates in the proximal stomach, likely in the gastric fundus. 2. Mildly dilated central and right lower abdominal small bowel loops, not appreciably changed, suggesting ongoing mild partial small-bowel obstruction. Electronically Signed   By: Ilona Sorrel M.D.   On:  04/16/2015 09:01   Dg Abd 2 Views  04/14/2015  CLINICAL DATA:  Small bowel obstruction with emesis and pain EXAM: ABDOMEN - 2 VIEW COMPARISON:  April 13, 2015 FINDINGS: Supine and upright images obtained. There is dilated small bowel in the left mid abdomen. There are multiple air-fluid levels. A small amount of contrast is seen in the colon. No free air is apparent. There are phleboliths in the pelvis. There is moderate air in the stomach. IMPRESSION: The bowel gas pattern is consistent with a degree of obstruction. No free air apparent. Electronically Signed   By: Lowella Grip III M.D.   On: 04/14/2015 15:15   Dg Abd 2 Views  04/13/2015  CLINICAL DATA:  Abdominal pain with nausea and vomiting EXAM: ABDOMEN - 2 VIEW COMPARISON:  04/12/2015 FINDINGS: Stable small bowel dilatation is noted with air-fluid levels. Contrast material is again noted within the colon. No free air is seen. No abnormal mass is noted. Degenerative changes of the lumbar spine are seen. IMPRESSION: Persistent small bowel dilatation stable from the prior exam. Electronically Signed   By: Inez Catalina M.D.   On: 04/13/2015 13:33   Dg Abd Portable 1v  04/14/2015  CLINICAL DATA:  Patient status post NG tube placement. EXAM: PORTABLE ABDOMEN - 1 VIEW COMPARISON:  Abdominal radiograph 04/14/2015. FINDINGS: Enteric tube tip and side-port project over the stomach. Gas is demonstrated within the colon. Oral contrast material within the colon. Lung bases are clear. Lower thoracic and lumbar spine degenerative changes. IMPRESSION: NG tube tip and side-port project over the stomach. Electronically Signed   By: Lovey Newcomer M.D.   On: 04/14/2015 16:48   Dg Abd Portable 1v  04/12/2015  CLINICAL DATA:  Abdominal pain, follow up small bowel obstruction EXAM: PORTABLE ABDOMEN - 1 VIEW COMPARISON:  04/11/2015 FINDINGS: Persistent small bowel dilatation is noted. Contrast material is noted throughout the colon  from previous administration.  No new focal abnormality is seen. No free air is noted. IMPRESSION: Stable small bowel dilatation. Electronically Signed   By: Inez Catalina M.D.   On: 04/12/2015 09:43   Dg Abd Portable 1v-small Bowel Obstruction Protocol-24 Hr Delay  04/11/2015  CLINICAL DATA:  Small-bowel follow-through, 24 hour delay film EXAM: PORTABLE ABDOMEN - 1 VIEW COMPARISON:  04/09/2015 FINDINGS: Scattered large and small bowel gas is noted. Persistent small bowel dilatation is seen similar to that noted on the recent CT examination. Contrast material is noted throughout the colon. These changes would suggest a partial small bowel obstruction. No free air is seen. No other focal abnormality is noted. IMPRESSION: 24 hour delayed film shows contrast material within the colon indicating a partial small bowel obstruction. Persistent small bowel dilatation is noted. Electronically Signed   By: Inez Catalina M.D.   On: 04/11/2015 07:10         Subjective: Patient denies fevers, chills, headache, chest pain, dyspnea, nausea, vomiting, diarrhea,  dysuria, hematuria; patient complains of some abdominal cramping with bisacodyl suppository.   Objective: Filed Vitals:   04/16/15 2119 04/17/15 0510 04/17/15 0907 04/17/15 1410  BP: 138/71 138/72  137/65  Pulse: 80 73  70  Temp: 98.3 F (36.8 C) 98.2 F (36.8 C)  97.7 F (36.5 C)  TempSrc: Oral Oral  Oral  Resp: '18 18  18  '$ Height:      Weight:      SpO2: 99% 100% 99% 100%    Intake/Output Summary (Last 24 hours) at 04/17/15 1605 Last data filed at 04/17/15 0548  Gross per 24 hour  Intake 1548.75 ml  Output    925 ml  Net 623.75 ml   Weight change:  Exam:   General:  Pt is alert, follows commands appropriately, not in acute distress  HEENT: No icterus, No thrush, No neck mass, [place]/AT  Cardiovascular: RRR, S1/S2, no rubs, no gallops  Respiratory: CTA bilaterally, no wheezing, no crackles, no rhonchi  Abdomen: Soft/+BS, non tender, non distended, no  guarding  Extremities: No edema, No lymphangitis, No petechiae, No rashes, no synovitis  Data Reviewed: Basic Metabolic Panel:  Recent Labs Lab 04/13/15 0415 04/14/15 0219 04/15/15 0346 04/16/15 0600 04/17/15 0412  NA 139 137 138 141 141  K 4.8 4.2 4.1 3.9 4.8  CL 107 104 105 111 110  CO2 20* 18* 22 19* 18*  GLUCOSE 113* 98 76 68 50*  BUN '7 15 20 15 11  '$ CREATININE 0.91 1.05* 1.07* 1.01* 0.91  CALCIUM 9.4 9.7 9.2 8.8* 8.9  MG  --   --  1.8  --   --   PHOS  --   --  3.2  --   --    Liver Function Tests: No results for input(s): AST, ALT, ALKPHOS, BILITOT, PROT, ALBUMIN in the last 168 hours. No results for input(s): LIPASE, AMYLASE in the last 168 hours. No results for input(s): AMMONIA in the last 168 hours. CBC:  Recent Labs Lab 04/14/15 0219 04/17/15 0412  WBC 8.5 10.2  HGB 15.8* 12.5  HCT 44.7 37.8  MCV 88.0 90.6  PLT 305 251   Cardiac Enzymes: No results for input(s): CKTOTAL, CKMB, CKMBINDEX, TROPONINI in the last 168 hours. BNP: Invalid input(s): POCBNP CBG:  Recent Labs Lab 04/17/15 0706 04/17/15 0743  GLUCAP 51* 134*    No results found for this or any previous visit (from the past 240 hour(s)).   Scheduled Meds: . azelastine  2  spray Each Nare Daily  . bisacodyl  10 mg Rectal Daily  . budesonide-formoterol  2 puff Inhalation Daily  . heparin  5,000 Units Subcutaneous 3 times per day  . metoCLOPramide (REGLAN) injection  10 mg Intravenous 3 times per day  . pantoprazole (PROTONIX) IV  40 mg Intravenous Q24H  . promethazine  12.5 mg Intravenous Once   Continuous Infusions: . sodium chloride 0.9 % 1,000 mL with potassium chloride 20 mEq infusion 75 mL/hr at 04/17/15 1118     Amen Dargis, DO  [facility] Pager [phone]  If 7PM-7AM, please contact night-coverage [email or web address] Password [email or web address] 04/17/2015, 4:05 PM   LOS: 7 days

## 2015-04-17 NOTE — Progress Notes (Signed)
Hypoglycemic Event  CBG: 60  Treatment: D50 IV 25 mL  Symptoms: dizzy  Follow-up CBG: Time:1726 CBG Result:102  Possible Reasons for Event: Inadequate meal intake  Comments/MD notified:Dr. Tat    Florentina Addison

## 2015-04-17 NOTE — Progress Notes (Signed)
  Subjective: Feels good. Complains of cramping at night. Having bm and flatus  Objective: Vital signs in last 24 hours: Temp:  [97.8 F (36.6 C)-98.3 F (36.8 C)] 98.2 F (36.8 C) (11/27 0510) Pulse Rate:  [73-82] 73 (11/27 0510) Resp:  [18] 18 (11/27 0510) BP: (138-175)/(71-88) 138/72 mmHg (11/27 0510) SpO2:  [99 %-100 %] 100 % (11/27 0510) Last BM Date: 05/01/15  Intake/Output from previous day: 2023/05/01 0701 - 11/27 0700 In: 1548.8 [I.V.:1548.8] Out: 1175 [Urine:950; Emesis/NG output:225] Intake/Output this shift:    Resp: clear to auscultation bilaterally Cardio: regular rate and rhythm GI: soft, nontender. nondistended  Lab Results:   Recent Labs  04/17/15 0412  WBC 10.2  HGB 12.5  HCT 37.8  PLT 251   BMET  Recent Labs  May 01, 2015 0600 04/17/15 0412  NA 141 141  K 3.9 4.8  CL 111 110  CO2 19* 18*  GLUCOSE 68 50*  BUN 15 11  CREATININE 1.01* 0.91  CALCIUM 8.8* 8.9   PT/INR No results for input(s): LABPROT, INR in the last 72 hours. ABG No results for input(s): PHART, HCO3 in the last 72 hours.  Invalid input(s): PCO2, PO2  Studies/Results: Dg Abd 2 Views  01-May-2015  CLINICAL DATA:  Enteric tube placement.  Small bowel obstruction. EXAM: ABDOMEN - 2 VIEW COMPARISON:  04/14/2015 abdominal radiograph FINDINGS: Enteric tube loops in the proximal stomach and terminates in the proximal stomach, likely in the gastric fundus. There a few mildly dilated small bowel loops in the central and right lower abdomen, not appreciably changed. Mild colonic stool. No evidence of pneumatosis or pneumoperitoneum. There is mild retained oral contrast in the colon. IMPRESSION: 1. Enteric tube loops in and terminates in the proximal stomach, likely in the gastric fundus. 2. Mildly dilated central and right lower abdominal small bowel loops, not appreciably changed, suggesting ongoing mild partial small-bowel obstruction. Electronically Signed   By: Ilona Sorrel M.D.   On:  01-May-2015 09:01    Anti-infectives: Anti-infectives    None      Assessment/Plan: s/p * No surgery found * will recheck abd xrays today. if improved then will try clamping ng  ambulate  LOS: 7 days    TOTH III,Helana Macbride S 04/17/2015

## 2015-04-17 NOTE — Progress Notes (Signed)
Clamped NG tube per MD order. Patient educated on letting RN know if she develops nausea, vomiting, increased pain, or abdominal distention. Will continue to monitor.

## 2015-04-17 NOTE — Progress Notes (Signed)
Hypoglycemic Event  CBG: 51  Treatment: D50 IV 50 mL  Symptoms: None  Follow-up CBG: Time:0743 CBG Result:134  Possible Reasons for Event: Inadequate meal intake  Comments/MD notified:Dr. Tat    Judeth Horn, [place]

## 2015-04-18 ENCOUNTER — Encounter (HOSPITAL_COMMUNITY)
Admission: EM | Disposition: A | Discharge status: Home / Self Care | Payer: Medicare HMO | Source: Home / Self Care | Attending: Internal Medicine

## 2015-04-18 ENCOUNTER — Inpatient Hospital Stay (HOSPITAL_COMMUNITY): Payer: Commercial Managed Care - HMO | Admitting: Anesthesiology

## 2015-04-18 ENCOUNTER — Encounter (HOSPITAL_COMMUNITY): Payer: Self-pay | Admitting: Certified Registered Nurse Anesthetist

## 2015-04-18 ENCOUNTER — Inpatient Hospital Stay (HOSPITAL_COMMUNITY): Payer: Commercial Managed Care - HMO

## 2015-04-18 HISTORY — PX: LAPAROTOMY: SHX154

## 2015-04-18 LAB — BASIC METABOLIC PANEL
Anion gap: 11 (ref 5–15)
BUN: 6 mg/dL (ref 6–20)
CO2: 21 mmol/L — ABNORMAL LOW (ref 22–32)
Calcium: 9.1 mg/dL (ref 8.9–10.3)
Chloride: 110 mmol/L (ref 101–111)
Creatinine, Ser: 0.78 mg/dL (ref 0.44–1.00)
GFR calc Af Amer: >60 mL/min (ref >60)
GFR calc non Af Amer: >60 mL/min (ref >60)
Glucose, Bld: 96 mg/dL (ref 65–99)
Potassium: 4.1 mmol/L (ref 3.5–5.1)
Sodium: 142 mmol/L (ref 135–145)

## 2015-04-18 LAB — SURGICAL PCR SCREEN
MRSA, PCR: NEGATIVE
Staphylococcus aureus: NEGATIVE

## 2015-04-18 LAB — GLUCOSE, CAPILLARY
Glucose-Capillary: 82 mg/dL (ref 65–99)
Glucose-Capillary: 90 mg/dL (ref 65–99)
Glucose-Capillary: 86 mg/dL (ref 65–99)
Glucose-Capillary: 131 mg/dL — ABNORMAL HIGH (ref 65–99)
Glucose-Capillary: 153 mg/dL — ABNORMAL HIGH (ref 65–99)

## 2015-04-18 LAB — CBC
HCT: 37.9 % (ref 36.0–46.0)
Hemoglobin: 13.1 g/dL (ref 12.0–15.0)
MCH: 30.4 pg (ref 26.0–34.0)
MCHC: 34.6 g/dL (ref 30.0–36.0)
MCV: 87.9 fL (ref 78.0–100.0)
Platelets: 234 10*3/uL (ref 150–400)
RBC: 4.31 MIL/uL (ref 3.87–5.11)
RDW: 14 % (ref 11.5–15.5)
WBC: 11 10*3/uL — ABNORMAL HIGH (ref 4.0–10.5)

## 2015-04-18 LAB — CORTISOL-AM, BLOOD: Cortisol - AM: 10 ug/dL (ref 6.7–22.6)

## 2015-04-18 IMAGING — DX DG ABDOMEN 2V
2 series · 2 of 2 positions shown · non-contrast
Comparison: Two-view abdomen [DATE].

CLINICAL DATA: Small bowel obstruction.

EXAM:
ABDOMEN - 2 VIEW

[abdomen erect]
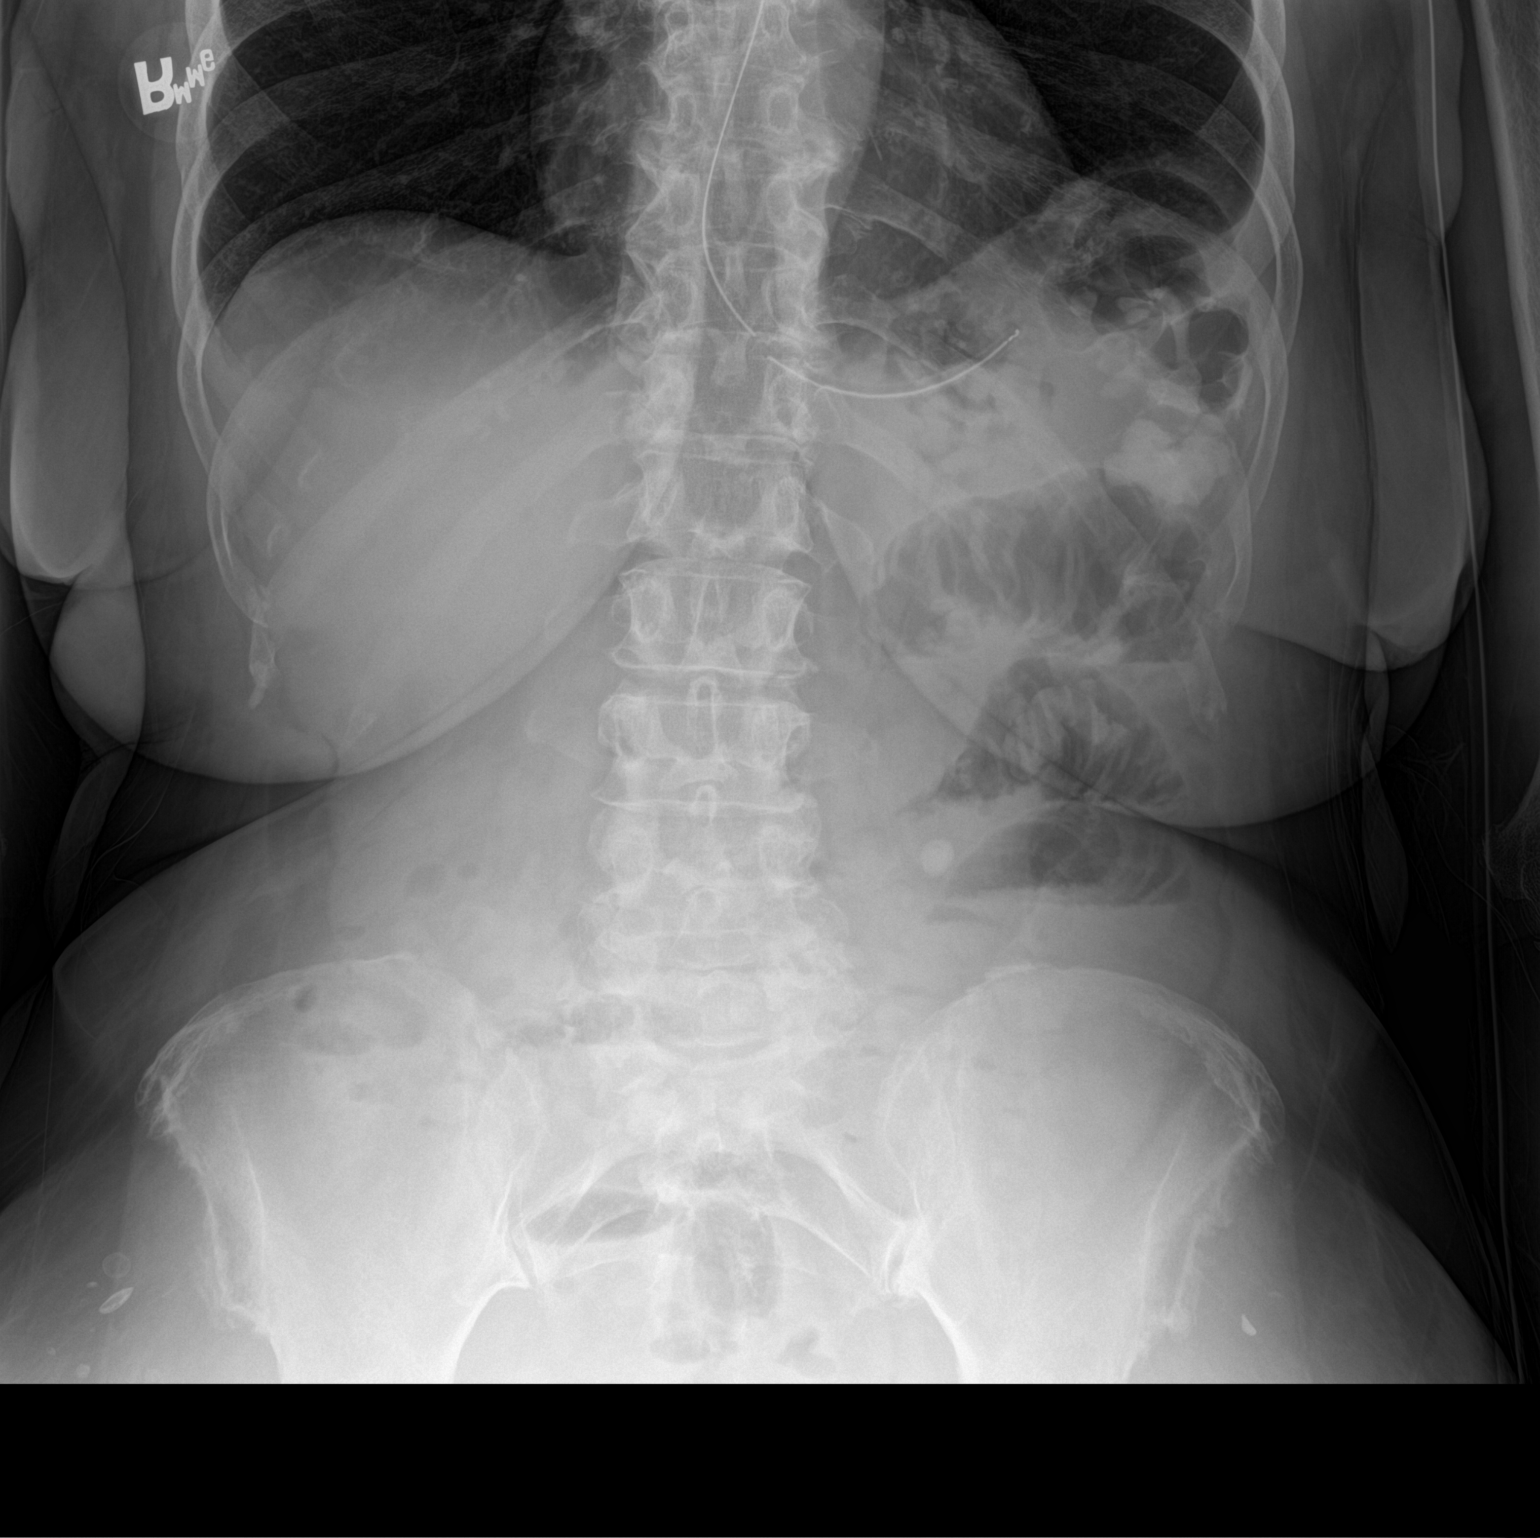

[abdomen supine]
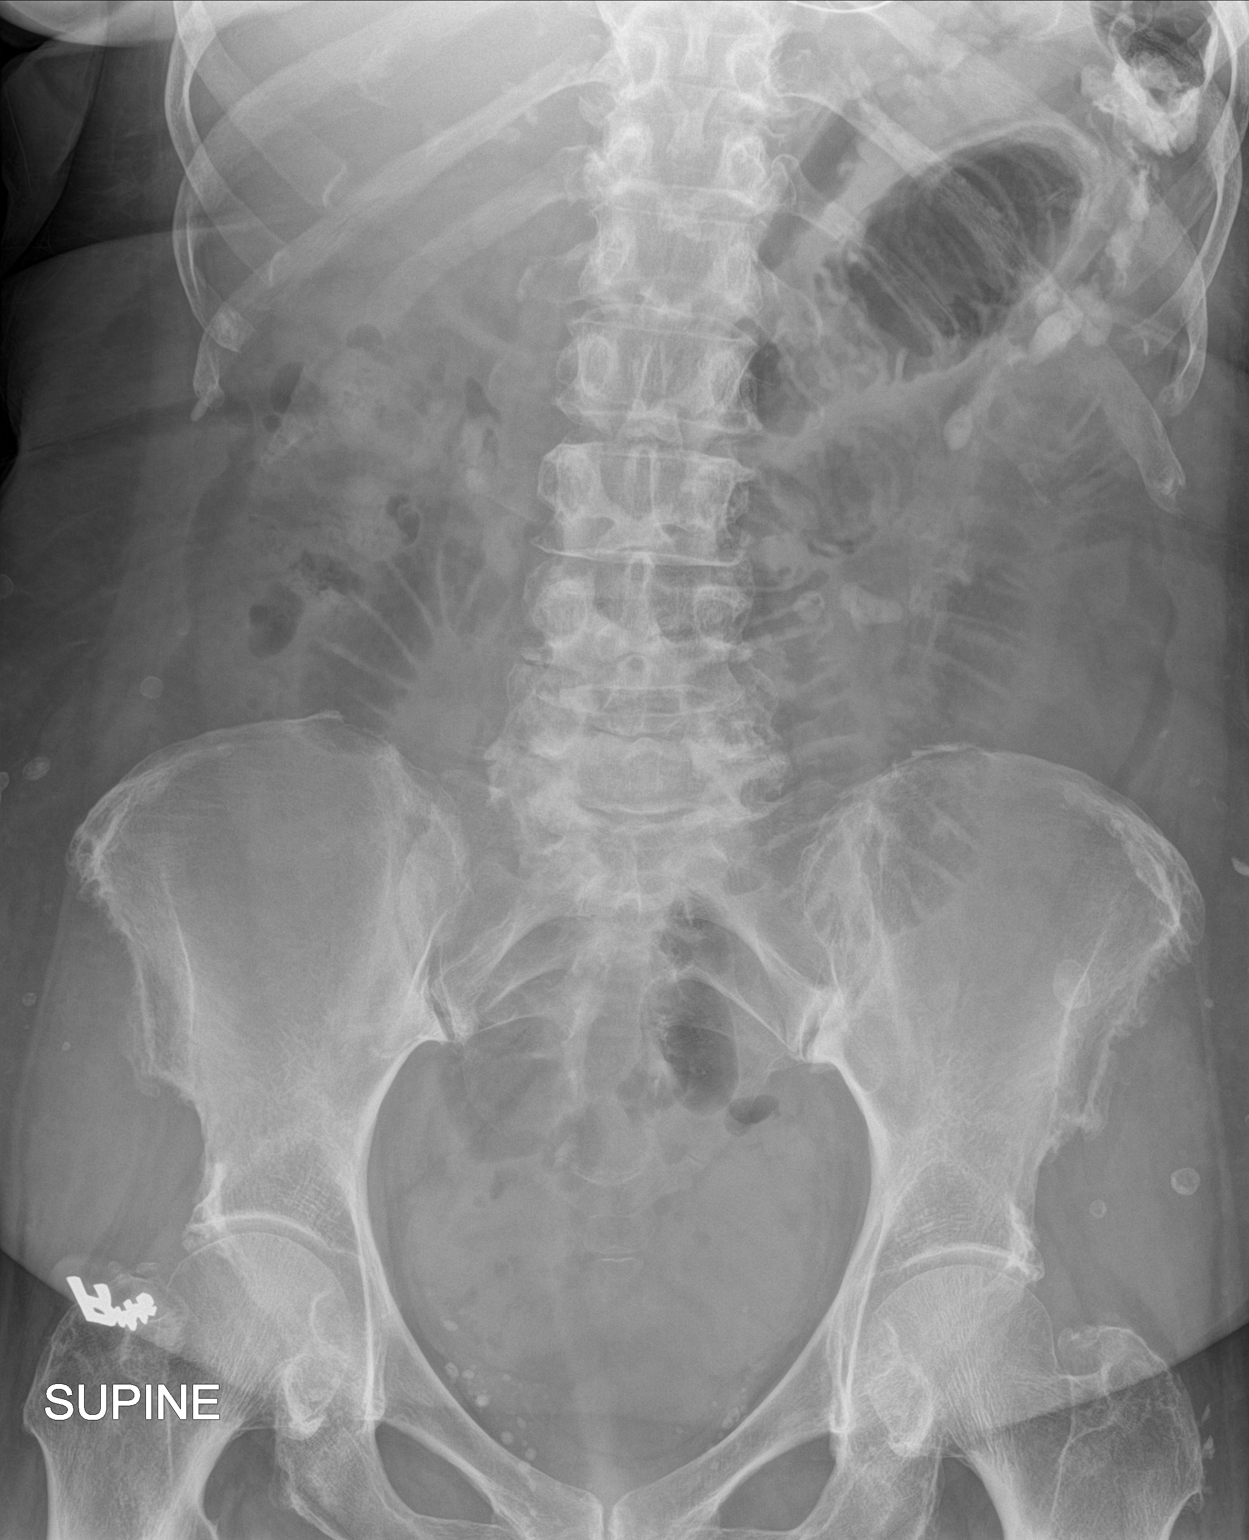

[2 of 2 positions shown; findings below may reference images not displayed]

FINDINGS: The side port of the NG tube is at or just above the GE junction.
Dilated loops of small bowel have increased since the prior exam.
Maximal dilation measures 4.5 cm on the supine image. Fluid levels
are present on the upright images without free air. The lung bases
are clear. Degenerative changes of the thoracolumbar spine are again
noted.
IMPRESSION: 1. Progressive dilation of small bowel compatible with worsening
small bowel obstruction.
2. The side port of the NG tube is at or just above the GE junction.

## 2015-04-18 SURGERY — LAPAROTOMY, EXPLORATORY
Anesthesia: General

## 2015-04-18 MED ORDER — LIDOCAINE HCL (CARDIAC) 20 MG/ML IV SOLN
INTRAVENOUS | Status: DC | PRN
  Administered 2015-04-18: 80 mg via INTRAVENOUS

## 2015-04-18 MED ORDER — PROPOFOL 10 MG/ML IV BOLUS
INTRAVENOUS | Status: DC | PRN
  Administered 2015-04-18: 80 mg via INTRAVENOUS

## 2015-04-18 MED ORDER — SUGAMMADEX SODIUM 200 MG/2ML IV SOLN
INTRAVENOUS | Status: DC | PRN
  Administered 2015-04-18: 150 mg via INTRAVENOUS

## 2015-04-18 MED ORDER — CLINDAMYCIN PHOSPHATE 900 MG/50ML IV SOLN
900.0000 mg | INTRAVENOUS | Status: AC
  Administered 2015-04-18: 900 mg via INTRAVENOUS
  Filled 2015-04-18: qty 50

## 2015-04-18 MED ORDER — PROPOFOL 10 MG/ML IV BOLUS
INTRAVENOUS | Status: AC
  Filled 2015-04-18: qty 20

## 2015-04-18 MED ORDER — SUGAMMADEX SODIUM 200 MG/2ML IV SOLN
INTRAVENOUS | Status: AC
  Filled 2015-04-18: qty 2

## 2015-04-18 MED ORDER — SUCCINYLCHOLINE CHLORIDE 20 MG/ML IJ SOLN
INTRAMUSCULAR | Status: DC | PRN
  Administered 2015-04-18: 100 mg via INTRAVENOUS

## 2015-04-18 MED ORDER — ESMOLOL HCL 100 MG/10ML IV SOLN
INTRAVENOUS | Status: DC | PRN
  Administered 2015-04-18: 20 mg via INTRAVENOUS

## 2015-04-18 MED ORDER — FENTANYL CITRATE (PF) 250 MCG/5ML IJ SOLN
INTRAMUSCULAR | Status: AC
  Filled 2015-04-18: qty 5

## 2015-04-18 MED ORDER — DEXAMETHASONE SODIUM PHOSPHATE 4 MG/ML IJ SOLN
INTRAMUSCULAR | Status: DC | PRN
  Administered 2015-04-18: 4 mg via INTRAVENOUS

## 2015-04-18 MED ORDER — ROCURONIUM BROMIDE 100 MG/10ML IV SOLN
INTRAVENOUS | Status: DC | PRN
  Administered 2015-04-18: 40 mg via INTRAVENOUS
  Administered 2015-04-18: 10 mg via INTRAVENOUS

## 2015-04-18 MED ORDER — HEPARIN SODIUM (PORCINE) 5000 UNIT/ML IJ SOLN
5000.0000 [IU] | Freq: Three times a day (TID) | INTRAMUSCULAR | Status: DC
  Administered 2015-04-19 – 2015-04-24 (×15): 5000 [IU] via SUBCUTANEOUS
  Filled 2015-04-18 (×16): qty 1

## 2015-04-18 MED ORDER — PROMETHAZINE HCL 25 MG/ML IJ SOLN: 6.2500 mg | INTRAMUSCULAR | Status: DC | PRN

## 2015-04-18 MED ORDER — ONDANSETRON HCL 4 MG/2ML IJ SOLN
INTRAMUSCULAR | Status: DC | PRN
  Administered 2015-04-18: 4 mg via INTRAVENOUS

## 2015-04-18 MED ORDER — HYDROMORPHONE HCL 1 MG/ML IJ SOLN: 0.2500 mg | INTRAMUSCULAR | Status: DC | PRN

## 2015-04-18 MED ORDER — 0.9 % SODIUM CHLORIDE (POUR BTL) OPTIME
TOPICAL | Status: DC | PRN
  Administered 2015-04-18: 1000 mL

## 2015-04-18 MED ORDER — MIDAZOLAM HCL 2 MG/2ML IJ SOLN
INTRAMUSCULAR | Status: AC
  Filled 2015-04-18: qty 2

## 2015-04-18 MED ORDER — FENTANYL CITRATE (PF) 100 MCG/2ML IJ SOLN
INTRAMUSCULAR | Status: DC | PRN
  Administered 2015-04-18: 50 ug via INTRAVENOUS
  Administered 2015-04-18: 100 ug via INTRAVENOUS
  Administered 2015-04-18 (×4): 50 ug via INTRAVENOUS
  Administered 2015-04-18: 100 ug via INTRAVENOUS
  Administered 2015-04-18: 50 ug via INTRAVENOUS

## 2015-04-18 MED ORDER — LACTATED RINGERS IV SOLN
INTRAVENOUS | Status: DC
  Administered 2015-04-18: 50 mL/h via INTRAVENOUS

## 2015-04-18 MED ORDER — GENTAMICIN SULFATE 40 MG/ML IJ SOLN
5.0000 mg/kg | INTRAVENOUS | Status: AC
  Administered 2015-04-18: 390 mg via INTRAVENOUS
  Filled 2015-04-18: qty 9.75

## 2015-04-18 SURGICAL SUPPLY — 44 items
BLADE SURG ROTATE 9660 (MISCELLANEOUS) IMPLANT
CANISTER SUCTION 2500CC (MISCELLANEOUS) ×2 IMPLANT
CHLORAPREP W/TINT 26ML (MISCELLANEOUS) ×2 IMPLANT
COVER MAYO STAND STRL (DRAPES) IMPLANT
COVER SURGICAL LIGHT HANDLE (MISCELLANEOUS) ×2 IMPLANT
DRAPE LAPAROSCOPIC ABDOMINAL (DRAPES) ×2 IMPLANT
DRAPE PROXIMA HALF (DRAPES) IMPLANT
DRAPE UTILITY XL STRL (DRAPES) ×4 IMPLANT
DRAPE WARM FLUID 44X44 (DRAPE) ×2 IMPLANT
DRSG OPSITE POSTOP 4X10 (GAUZE/BANDAGES/DRESSINGS) ×1 IMPLANT
DRSG OPSITE POSTOP 4X8 (GAUZE/BANDAGES/DRESSINGS) IMPLANT
ELECT BLADE 6.5 EXT (BLADE) IMPLANT
ELECT CAUTERY BLADE 6.4 (BLADE) ×3 IMPLANT
ELECT REM PT RETURN 9FT ADLT (ELECTROSURGICAL) ×2
ELECTRODE REM PT RTRN 9FT ADLT (ELECTROSURGICAL) ×1 IMPLANT
GLOVE BIO SURGEON STRL SZ7.5 (GLOVE) ×2 IMPLANT
GLOVE BIOGEL PI IND STRL 6 (GLOVE) IMPLANT
GLOVE BIOGEL PI IND STRL 8 (GLOVE) ×1 IMPLANT
GLOVE BIOGEL PI INDICATOR 6 (GLOVE) ×2
GLOVE BIOGEL PI INDICATOR 8 (GLOVE) ×1
GOWN STRL REUS W/ TWL LRG LVL3 (GOWN DISPOSABLE) ×2 IMPLANT
GOWN STRL REUS W/ TWL XL LVL3 (GOWN DISPOSABLE) ×1 IMPLANT
GOWN STRL REUS W/TWL LRG LVL3 (GOWN DISPOSABLE) ×4
GOWN STRL REUS W/TWL XL LVL3 (GOWN DISPOSABLE) ×2
KIT BASIN OR (CUSTOM PROCEDURE TRAY) ×2 IMPLANT
KIT ROOM TURNOVER OR (KITS) ×2 IMPLANT
LIGASURE IMPACT 36 18CM CVD LR (INSTRUMENTS) IMPLANT
NS IRRIG 1000ML POUR BTL (IV SOLUTION) ×4 IMPLANT
PACK GENERAL/GYN (CUSTOM PROCEDURE TRAY) ×2 IMPLANT
PAD ARMBOARD 7.5X6 YLW CONV (MISCELLANEOUS) ×4 IMPLANT
PENCIL BUTTON HOLSTER BLD 10FT (ELECTRODE) IMPLANT
SPECIMEN JAR LARGE (MISCELLANEOUS) IMPLANT
SPONGE LAP 18X18 X RAY DECT (DISPOSABLE) IMPLANT
STAPLER VISISTAT 35W (STAPLE) ×2 IMPLANT
SUCTION POOLE TIP (SUCTIONS) ×2 IMPLANT
SUT PDS AB 1 TP1 96 (SUTURE) ×4 IMPLANT
SUT SILK 2 0 SH CR/8 (SUTURE) ×2 IMPLANT
SUT SILK 2 0 TIES 10X30 (SUTURE) ×2 IMPLANT
SUT SILK 3 0 SH CR/8 (SUTURE) ×2 IMPLANT
SUT SILK 3 0 TIES 10X30 (SUTURE) ×2 IMPLANT
TOWEL OR 17X26 10 PK STRL BLUE (TOWEL DISPOSABLE) ×2 IMPLANT
TRAY FOLEY CATH 16FRSI W/METER (SET/KITS/TRAYS/PACK) ×1 IMPLANT
TUBE CONNECTING 12X1/4 (SUCTIONS) IMPLANT
YANKAUER SUCT BULB TIP NO VENT (SUCTIONS) IMPLANT

## 2015-04-18 NOTE — Progress Notes (Signed)
[facility] Progress Note     Subjective: Pt denies N/V, but feels more bloated and tender in her abdomen.  C/o NG tube (clamped for 24hr).  Ambulating in halls, sitting on the side of the bed this am.  No flatus or BM this am.  Last BM was yesterday at 04/17/15.  KUB this am shows worsening SB dilatation and worsening SBO.    Objective: Vital signs in last 24 hours: Temp:  [97.4 F (36.3 C)-98.4 F (36.9 C)] 97.4 F (36.3 C) 04-May-2023 0530) Pulse Rate:  [70-77] 73 2023-05-04 0530) Resp:  [18] 18 04-May-2023 0530) BP: (137-155)/(65-83) 155/83 mmHg 05-04-23 0530) SpO2:  [100 %] 100 % 04-May-2023 0925) Last BM Date: 04/17/15  Intake/Output from previous day: 11/27 0701 - 05/04/23 0700 In: 1003.8 [I.V.:1003.8] Out: 950 [Urine:950] Intake/Output this shift:    PE: Gen:  Alert, NAD, pleasant Abd: Soft, mild distension, RLQ tenderness (less currently, but just had pain meds), +BS, no HSM   Lab Results:   Recent Labs  04/17/15 0412 May 04, 2015 0750  WBC 10.2 11.0*  HGB 12.5 13.1  HCT 37.8 37.9  PLT 251 234   BMET  Recent Labs  04/17/15 0412 May 04, 2015 0750  NA 141 142  K 4.8 4.1  CL 110 110  CO2 18* 21*  GLUCOSE 50* 96  BUN 11 6  CREATININE 0.91 0.78  CALCIUM 8.9 9.1   PT/INR No results for input(s): LABPROT, INR in the last 72 hours. CMP     Component Value Date/Time   NA 142 04-May-2015 0750   K 4.1 May 04, 2015 0750   CL 110 04-May-2015 0750   CO2 21* 04-May-2015 0750   GLUCOSE 96 05-04-15 0750   BUN 6 05-04-2015 0750   CREATININE 0.78 2015-05-04 0750   CALCIUM 9.1 May 04, 2015 0750   PROT 7.2 04/09/2015 2148   ALBUMIN 3.6 04/09/2015 2148   AST 25 04/09/2015 2148   ALT 13* 04/09/2015 2148   ALKPHOS 75 04/09/2015 2148   BILITOT 0.6 04/09/2015 2148   GFRNONAA >60 May 04, 2015 0750   GFRAA >60 04-May-2015 0750   Lipase     Component Value Date/Time   LIPASE 25 04/09/2015 2148       Studies/Results: Dg Abd 2 Views  2015/05/04  CLINICAL DATA:  Small bowel  obstruction. EXAM: ABDOMEN - 2 VIEW COMPARISON:  Two-view abdomen 04/17/2015. FINDINGS: The side port of the NG tube is at or just above the GE junction. Dilated loops of small bowel have increased since the prior exam. Maximal dilation measures 4.5 cm on the supine image. Fluid levels are present on the upright images without free air. The lung bases are clear. Degenerative changes of the thoracolumbar spine are again noted. IMPRESSION: 1. Progressive dilation of small bowel compatible with worsening small bowel obstruction. 2. The side port of the NG tube is at or just above the GE junction. Electronically Signed   By: San Morelle M.D.   On: May 04, 2015 07:13   Dg Abd 2 Views  04/17/2015  CLINICAL DATA:  Small bowel obstruction. EXAM: ABDOMEN - 2 VIEW COMPARISON:  04/16/2015 and 04/14/2015 FINDINGS: Nasogastric tube unchanged with tip and side-port over the stomach in the left upper quadrant likely in the gastric fundus. Bowel gas pattern is improved as air is present throughout the colon. Single air-filled minimally dilated small bowel loop in the left mid abdomen improved. No evidence of free peritoneal air. Remainder of the exam is unchanged. IMPRESSION: Continued improvement in bowel gas pattern with only  single mildly dilated air-filled loop of small bowel in the left mid abdomen. Air throughout the colon. Nasogastric tube unchanged with tip and side-port over the gastric fundus. Electronically Signed   By: Marin Olp M.D.   On: 04/17/2015 09:38    Anti-infectives: Anti-infectives    None       Assessment/Plan PSBO/N/V -NG has been clamped for 24 hours, contrast has made it to the colon last week, but SB loops continue to be large 4.5cm today.  Xray this am shows progressive dilatation with worsening SBO.  Given this on/off picture may need to consider surgical interventions.  Discussed with Dr. Rosendo Gros, we will proceed with ex lap with LOA, possible bowel resection.  Likely has  adhesions.  Pre-op gent/clinda. -Less flatus and no BM yet today, did have some yesterday -Early mobilization and IS after surgery -D/c heparin, cont SCD's  Chronic abdominal pain - followed by Dr. Watt Climes H/o laparoscopic assisted vaginal hysterectomy with b/l salpingo-oophorectomy with LOA 07/2001    LOS: 8 days    Angelica [name] 04/18/2015, 10:15 AM Pager: [phone]

## 2015-04-18 NOTE — Anesthesia Preprocedure Evaluation (Signed)
Anesthesia Evaluation  Patient identified by MRN, date of birth, ID band Patient awake    Reviewed: Allergy & Precautions, NPO status , Patient's Chart, lab work & pertinent test results  Airway Mallampati: II  TM Distance: >3 FB Neck ROM: Full    Dental no notable dental hx.    Pulmonary asthma , former smoker,    Pulmonary exam normal breath sounds clear to auscultation       Cardiovascular hypertension, Pt. on medications Normal cardiovascular exam Rhythm:Regular Rate:Normal     Neuro/Psych negative neurological ROS  negative psych ROS   GI/Hepatic negative GI ROS, Neg liver ROS,   Endo/Other  negative endocrine ROS  Renal/GU negative Renal ROS  negative genitourinary   Musculoskeletal negative musculoskeletal ROS (+)   Abdominal   Peds negative pediatric ROS (+)  Hematology negative hematology ROS (+)   Anesthesia Other Findings   Reproductive/Obstetrics negative OB ROS                             Anesthesia Physical Anesthesia Plan  ASA: II  Anesthesia Plan: General   Post-op Pain Management:    Induction: Intravenous, Rapid sequence and Cricoid pressure planned  Airway Management Planned: Oral ETT  Additional Equipment:   Intra-op Plan:   Post-operative Plan: Extubation in OR  Informed Consent: I have reviewed the patients History and Physical, chart, labs and discussed the procedure including the risks, benefits and alternatives for the proposed anesthesia with the patient or authorized representative who has indicated his/her understanding and acceptance.   Dental advisory given  Plan Discussed with: CRNA and Surgeon  Anesthesia Plan Comments:         Anesthesia Quick Evaluation

## 2015-04-18 NOTE — Op Note (Signed)
04/18/2015  2:38 PM  PATIENT:  Angelica [name]  74 y.o. female  PRE-OPERATIVE DIAGNOSIS:  Unresolved small bowel obstruction  POST-OPERATIVE DIAGNOSIS:  Adhesions  PROCEDURE:  Procedure(s): Exploratory laparotomy with lysis of adhesions, possible bowel resection (N/A)  SURGEON:  Surgeon(s) and Role:    * Ralene Ok, MD - Primary  ANESTHESIA:   general  EBL:   25cc  BLOOD ADMINISTERED:none  DRAINS: none   LOCAL MEDICATIONS USED:  NONE  SPECIMEN:  Source of Specimen:  none  DISPOSITION OF SPECIMEN:  N/A  COUNTS:  YES  TOURNIQUET:  * No tourniquets in log *  DICTATION: .Dragon Dictation After the patient was consent she was taken back to the OR and placed in the supine position with bilateral SCDs in place.  The patient had a Foley catheter placed. The patient was then prepped and draped in the usual sterile fashion. A timeout was called all facts verified.  A 10 blade was used to make a midline incision. Electrocautery was used to maintain hemostasis and dissection was taken down to the midline fascia. This was incised. The skin was elevated. The abdominal cavity/peritoneum was entered bluntly. At this time the fascia was extended to the length of the skin incision. At this time the patient's small bowel was eviscerated. The proximal loops of small bowel were notably distended. The small bowel was run from ligament of Treitz distally. There were some thick bandlike adhesions to the right lower quadrant/distal ileum. There was a definite transition point in this area. These bands were lysed. This allowed [place] to straighten out the bowel. There was some bowel to mesenteric adhesions. These were also scored to help straightening of the small bowel. There was some bleeding of the mesentery in this area. This was suture ligated using a 3-0 silk in interrupted fashion/figure-of-eight. This allowed the gas to easily pass through this area. At this time the small bowel was placed back  into the abdomen. The omentum was brought over the midline incision site. The midline fascia was reapproximated using #1 PDS, single strand and a running standard fashion. The skin was reapproximated with skin staples. The wound distress with a honeycomb dressing.  The patient how the procedure well was taken to the recovery room stable condition.   PLAN OF CARE: Admit to inpatient   PATIENT DISPOSITION:  PACU - hemodynamically stable.   Delay start of Pharmacological VTE agent (>24hrs) due to surgical blood loss or risk of bleeding: not applicable

## 2015-04-18 NOTE — Progress Notes (Signed)
PROGRESS NOTE  Angelica [name] QIW:[id number] DOB: Aug 22, 1940 DOA: 04/09/2015 PCP: Foye Spurling, MD   Brief narrative 74 year old female with hypertension, GERD, chronic abdominal pain (sees Dr. Watt Climes), history of hysterectomy presented to the ED with several days of abdominal pain with nausea and vomiting of clear source and constipated since past 2 days. Patient was found to have minimal abdominal tenderness on exam with CT of the abdomen showing small bowel obstruction with transition point in the mid lower pelvis. Admit to medical floor and surgery consulted. The patient was ultimately started on a clear liquid diet. Unfortunately, she continued to have vomiting. Abdominal x-ray on 04/13/2015 revealed persistent small bowel dilatation. As result, NG tube was placed to low intermittent suction   Assessment/Plan: Partial small bowel obstruction -Possibly secondary to adhesions from prior surgery. . - IV hydration. Keep K >4.  -CCS following.  -Continue supportive care with when necessary pain medications and antiemetics. Minimize opioids -11/23--place NG to LIS-->no further vomiting -11/27--clamp NG tube -Remain NPO -switch essential meds to IV if possible -Continue IVF -04/17/15--AXR--continued improvement of bowel gas pattern -04/18/2015 AXR-- aggressive small bowel dilatation - 04/18/2015-- patient taken to surgery-- exposure laparotomy with lysis of adhesions-Dr. Rosendo Gros  Hypoglycemia -due to npo status -CBG checks q 4 hours -change to D5NS -am cortisol--10  Hypokalemia Replenished Keep K >4  Essential Hypertension Stable. Monitor on when necessary hydralazine  COPD -stable on RA -continue symbicort  Dispo--2-3 days Family--updated daughter at bedside    Procedures/Studies: Ct Abdomen Pelvis W Contrast  04/10/2015  CLINICAL DATA:  Nausea and vomiting today. Lower abdominal pain. History of abdominal surgeries. EXAM: CT ABDOMEN AND PELVIS WITH  CONTRAST TECHNIQUE: Multidetector CT imaging of the abdomen and pelvis was performed using the standard protocol following bolus administration of intravenous contrast. CONTRAST:  41m OMNIPAQUE IOHEXOL 300 MG/ML  SOLN COMPARISON:  CT 02/17/2014 FINDINGS: Lower chest:  The included lung bases are clear. Liver: No focal hepatic lesion. Hepatobiliary: Gallbladder physiologically distended. Small wall calcification is unchanged, no calcified gallstone. No biliary dilatation. Pancreas: No ductal dilatation or inflammation. Spleen: Normal. Adrenal glands: No nodule. Kidneys: Symmetric renal enhancement and excretion. No hydronephrosis. No perinephric stranding or localizing renal abnormality. Stomach/Bowel: The stomach is decompressed. Proximal small bowel loops are nondistended. There is a moderate length segment of dilated fluid-filled small bowel loops in the lower abdomen/pelvis. Transition point seen in the midline lower pelvis image 60 (better appreciated on sagittal reformats image 90). There is a question of a more proximal transition point or posteriorly in the lower abdomen, image 52, however this is not definitive. Mild adjacent mesenteric edema and small volume of free fluid. No definite bowel wall thickening. No mesenteric swirling. No pneumatosis. Distal small bowel loops are decompressed. Small volume of stool throughout the colon without colonic wall thickening. Portions of the colon are not well defined given bowel dilatation and lack of enteric contrast. Appendix is not visualized. Vascular/Lymphatic: No retroperitoneal adenopathy. Abdominal aorta is normal in caliber. Mild atherosclerosis without aneurysm. Reproductive: Uterus presumably absent. No evidence of adnexal mass. Bladder: Physiologically distended. Other: No free air free fluid, or intra-abdominal fluid collection. Scattered soft tissue granulomas. Musculoskeletal: There are no acute or suspicious osseous abnormalities. Multilevel  degenerative change throughout the spine with degenerative disc disease and facet arthropathy. 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 IMPRESSION: Small bowel obstruction with transition point in the mid mid lower pelvis. A more proximal second point is questioned but  not definitive, and closed loop obstruction is not excluded. However early bowel obstruction is favored. Electronically Signed   By: Jeb Levering M.D.   On: 04/10/2015 00:15   Dg Abd 2 Views  04/18/2015  CLINICAL DATA:  Small bowel obstruction. EXAM: ABDOMEN - 2 VIEW COMPARISON:  Two-view abdomen 04/17/2015. FINDINGS: The side port of the NG tube is at or just above the GE junction. Dilated loops of small bowel have increased since the prior exam. Maximal dilation measures 4.5 cm on the supine image. Fluid levels are present on the upright images without free air. The lung bases are clear. Degenerative changes of the thoracolumbar spine are again noted. IMPRESSION: 1. Progressive dilation of small bowel compatible with worsening small bowel obstruction. 2. The side port of the NG tube is at or just above the GE junction. Electronically Signed   By: San Morelle M.D.   On: 04/18/2015 07:13   Dg Abd 2 Views  04/17/2015  CLINICAL DATA:  Small bowel obstruction. EXAM: ABDOMEN - 2 VIEW COMPARISON:  04/16/2015 and 04/14/2015 FINDINGS: Nasogastric tube unchanged with tip and side-port over the stomach in the left upper quadrant likely in the gastric fundus. Bowel gas pattern is improved as air is present throughout the colon. Single air-filled minimally dilated small bowel loop in the left mid abdomen improved. No evidence of free peritoneal air. Remainder of the exam is unchanged. IMPRESSION: Continued improvement in bowel gas pattern with only single mildly dilated air-filled loop of small bowel in the left mid abdomen. Air throughout the colon. Nasogastric tube unchanged with tip and side-port over the gastric fundus.  Electronically Signed   By: Marin Olp M.D.   On: 04/17/2015 09:38   Dg Abd 2 Views  04/16/2015  CLINICAL DATA:  Enteric tube placement.  Small bowel obstruction. EXAM: ABDOMEN - 2 VIEW COMPARISON:  04/14/2015 abdominal radiograph FINDINGS: Enteric tube loops in the proximal stomach and terminates in the proximal stomach, likely in the gastric fundus. There a few mildly dilated small bowel loops in the central and right lower abdomen, not appreciably changed. Mild colonic stool. No evidence of pneumatosis or pneumoperitoneum. There is mild retained oral contrast in the colon. IMPRESSION: 1. Enteric tube loops in and terminates in the proximal stomach, likely in the gastric fundus. 2. Mildly dilated central and right lower abdominal small bowel loops, not appreciably changed, suggesting ongoing mild partial small-bowel obstruction. Electronically Signed   By: Ilona Sorrel M.D.   On: 04/16/2015 09:01   Dg Abd 2 Views  04/14/2015  CLINICAL DATA:  Small bowel obstruction with emesis and pain EXAM: ABDOMEN - 2 VIEW COMPARISON:  April 13, 2015 FINDINGS: Supine and upright images obtained. There is dilated small bowel in the left mid abdomen. There are multiple air-fluid levels. A small amount of contrast is seen in the colon. No free air is apparent. There are phleboliths in the pelvis. There is moderate air in the stomach. IMPRESSION: The bowel gas pattern is consistent with a degree of obstruction. No free air apparent. Electronically Signed   By: Lowella Grip III M.D.   On: 04/14/2015 15:15   Dg Abd 2 Views  04/13/2015  CLINICAL DATA:  Abdominal pain with nausea and vomiting EXAM: ABDOMEN - 2 VIEW COMPARISON:  04/12/2015 FINDINGS: Stable small bowel dilatation is noted with air-fluid levels. Contrast material is again noted within the colon. No free air is seen. No abnormal mass is noted. Degenerative changes of the lumbar spine are seen. IMPRESSION:  Persistent small bowel dilatation stable from  the prior exam. Electronically Signed   By: Inez Catalina M.D.   On: 04/13/2015 13:33   Dg Abd Portable 1v  04/14/2015  CLINICAL DATA:  Patient status post NG tube placement. EXAM: PORTABLE ABDOMEN - 1 VIEW COMPARISON:  Abdominal radiograph 04/14/2015. FINDINGS: Enteric tube tip and side-port project over the stomach. Gas is demonstrated within the colon. Oral contrast material within the colon. Lung bases are clear. Lower thoracic and lumbar spine degenerative changes. IMPRESSION: NG tube tip and side-port project over the stomach. Electronically Signed   By: Lovey Newcomer M.D.   On: 04/14/2015 16:48   Dg Abd Portable 1v  04/12/2015  CLINICAL DATA:  Abdominal pain, follow up small bowel obstruction EXAM: PORTABLE ABDOMEN - 1 VIEW COMPARISON:  04/11/2015 FINDINGS: Persistent small bowel dilatation is noted. Contrast material is noted throughout the colon from previous administration. No new focal abnormality is seen. No free air is noted. IMPRESSION: Stable small bowel dilatation. Electronically Signed   By: Inez Catalina M.D.   On: 04/12/2015 09:43   Dg Abd Portable 1v-small Bowel Obstruction Protocol-24 Hr Delay  04/11/2015  CLINICAL DATA:  Small-bowel follow-through, 24 hour delay film EXAM: PORTABLE ABDOMEN - 1 VIEW COMPARISON:  04/09/2015 FINDINGS: Scattered large and small bowel gas is noted. Persistent small bowel dilatation is seen similar to that noted on the recent CT examination. Contrast material is noted throughout the colon. These changes would suggest a partial small bowel obstruction. No free air is seen. No other focal abnormality is noted. IMPRESSION: 24 hour delayed film shows contrast material within the colon indicating a partial small bowel obstruction. Persistent small bowel dilatation is noted. Electronically Signed   By: Inez Catalina M.D.   On: 04/11/2015 07:10         Subjective: Patient resting comfortably after surgery. Pain is controlled. Denies any shortness breath,  vomiting, chest pain, headache. Denies any fevers or chills  Objective: Filed Vitals:   04/18/15 1500 04/18/15 1515 04/18/15 1530 04/18/15 1552  BP: 158/83 149/79 164/78 162/76  Pulse: 75 79 75 72  Temp:   97.6 F (36.4 C) 98 F (36.7 C)  TempSrc:    Oral  Resp: '12 11 15 16  '$ Height:      Weight:      SpO2:   100% 99%    Intake/Output Summary (Last 24 hours) at 04/18/15 1942 Last data filed at 04/18/15 1530  Gross per 24 hour  Intake    600 ml  Output    225 ml  Net    375 ml   Weight change:  Exam:   General:  Pt is alert, follows commands appropriately, not in acute distress  HEENT: No icterus, No thrush, No neck mass, [place]/AT  Cardiovascular: RRR, S1/S2, no rubs, no gallops  Respiratory: diminished breath sounds at the bases. No wheezing  Abdomen: Soft/diminished bowel sounds,non distended  Extremities: No edema, No lymphangitis, No petechiae, No rashes, no synovitis  Data Reviewed: Basic Metabolic Panel:  Recent Labs Lab 04/14/15 0219 04/15/15 0346 04/16/15 0600 04/17/15 0412 04/18/15 0750  NA 137 138 141 141 142  K 4.2 4.1 3.9 4.8 4.1  CL 104 105 111 110 110  CO2 18* 22 19* 18* 21*  GLUCOSE 98 76 68 50* 96  BUN '15 20 15 11 6  '$ CREATININE 1.05* 1.07* 1.01* 0.91 0.78  CALCIUM 9.7 9.2 8.8* 8.9 9.1  MG  --  1.8  --   --   --  PHOS  --  3.2  --   --   --    Liver Function Tests: No results for input(s): AST, ALT, ALKPHOS, BILITOT, PROT, ALBUMIN in the last 168 hours. No results for input(s): LIPASE, AMYLASE in the last 168 hours. No results for input(s): AMMONIA in the last 168 hours. CBC:  Recent Labs Lab 04/14/15 0219 04/17/15 0412 04/18/15 0750  WBC 8.5 10.2 11.0*  HGB 15.8* 12.5 13.1  HCT 44.7 37.8 37.9  MCV 88.0 90.6 87.9  PLT 305 251 234   Cardiac Enzymes: No results for input(s): CKTOTAL, CKMB, CKMBINDEX, TROPONINI in the last 168 hours. BNP: Invalid input(s): POCBNP CBG:  Recent Labs Lab 04/17/15 2140 04/18/15 0408  04/18/15 0754 04/18/15 1158 04/18/15 1609  GLUCAP 96 82 90 86 131*    Recent Results (from the past 240 hour(s))  Surgical pcr screen     Status: None   Collection Time: 04/18/15 12:16 PM  Result Value Ref Range Status   MRSA, PCR NEGATIVE NEGATIVE Final   Staphylococcus aureus NEGATIVE NEGATIVE Final    Comment:        The Xpert SA Assay (FDA approved for NASAL specimens in patients over 73 years of age), is one component of a comprehensive surveillance program.  Test performance has been validated by [facility] for patients greater than or equal to 52 year old. It is not intended to diagnose infection nor to guide or monitor treatment.      Scheduled Meds: . azelastine  2 spray Each Nare Daily  . bisacodyl  10 mg Rectal Daily  . budesonide-formoterol  2 puff Inhalation Daily  . [START ON 04/19/2015] heparin subcutaneous  5,000 Units Subcutaneous 3 times per day  . metoCLOPramide (REGLAN) injection  10 mg Intravenous 3 times per day  . pantoprazole (PROTONIX) IV  40 mg Intravenous Q24H  . promethazine  12.5 mg Intravenous Once   Continuous Infusions: . dextrose 5 % and 0.9% NaCl 1,000 mL with potassium chloride 20 mEq infusion 75 mL/hr at 04/18/15 0747  . lactated ringers 50 mL/hr (04/18/15 1322)     Tangie Stay, DO  [facility] Pager [phone]  If 7PM-7AM, please contact night-coverage [email or web address] Password [email or web address] 04/18/2015, 7:42 PM   LOS: 8 days

## 2015-04-18 NOTE — Transfer of Care (Signed)
Immediate Anesthesia Transfer of Care Note  Patient: Angelica [name]  Procedure(s) Performed: Procedure(s): Exploratory laparotomy with lysis of adhesions, possible bowel resection (N/A)  Patient Location: PACU  Anesthesia Type:General  Level of Consciousness: awake, alert , oriented and patient cooperative  Airway & Oxygen Therapy: Patient Spontanous Breathing and Patient connected to nasal cannula oxygen  Post-op Assessment: Report given to RN, Post -op Vital signs reviewed and stable, Patient moving all extremities, Patient moving all extremities X 4 and Patient able to stick tongue midline  Post vital signs: Reviewed and stable  Last Vitals:  Filed Vitals:   04/18/15 0530 04/18/15 1450  BP: 155/83 139/81  Pulse: 73 88  Temp: 36.3 C 36.9 C  Resp: 18 20    Complications: No apparent anesthesia complications

## 2015-04-18 NOTE — Anesthesia Procedure Notes (Signed)
Procedure Name: Intubation Date/Time: 04/18/2015 1:40 PM Performed by: Shirlyn Goltz Pre-anesthesia Checklist: Emergency Drugs available, Patient identified, Suction available and Patient being monitored Patient Re-evaluated:Patient Re-evaluated prior to inductionOxygen Delivery Method: Circle system utilized Preoxygenation: Pre-oxygenation with 100% oxygen Intubation Type: IV induction, Cricoid Pressure applied and Rapid sequence Laryngoscope Size: Miller and 2 Grade View: Grade I Tube type: Oral Tube size: 7.5 mm Number of attempts: 1 Airway Equipment and Method: Stylet Placement Confirmation: ETT inserted through vocal cords under direct vision,  positive ETCO2 and breath sounds checked- equal and bilateral Secured at: 22 cm Tube secured with: Tape Dental Injury: Teeth and Oropharynx as per pre-operative assessment

## 2015-04-18 NOTE — Care Management Important Message (Signed)
Important Message  Patient Details  Name: DEIONDRA DENLEY MRN: [id number] Date of Birth: 1940/09/06   Medicare Important Message Given:  Yes    Nathen May 04/18/2015, 3:42 PM

## 2015-04-18 NOTE — Progress Notes (Signed)
Nutrition Follow-up  DOCUMENTATION CODES:   Non-severe (moderate) malnutrition in context of acute illness/injury  INTERVENTION:   -RD will follow for diet advancement -If unable to advance diet beyond clear liquids within 7-10 days of admission, consider initiation of nutrition support  NUTRITION DIAGNOSIS:   Malnutrition related to acute illness as evidenced by percent weight loss, mild depletion of body fat, mild depletion of muscle mass.  Ongoing  GOAL:   Patient will meet greater than or equal to 90% of their needs  Unmet  MONITOR:   PO intake, Supplement acceptance, Diet advancement, Labs, Weight trends, Skin, I & O's  REASON FOR ASSESSMENT:   Malnutrition Screening Tool    ASSESSMENT:   74 year old female with hypertension, GERD, chronic abdominal pain (sees Dr. Watt Climes), history of hysterectomy presented to the ED with several days of abdominal pain with nausea and vomiting of clear source and constipated since past 2 days. Patient was found to have minimal abdominal tenderness on exam with CT of the abdomen showing small bowel obstruction with transition point in the mid lower pelvis. Admit to medical floor and surgery consulted.   NGT placed on 04/14/15 to low intermittent suction. NGT clamped on 04/17/15. Noted 150 ml output within the past 24 hours per doc flowsheets.   Per surgical notes, KUB revealed progressive dilation and worsening SBO. Plan is for pt to undergo ex lap with LOA with possible bowel resection later this afternoon. Pt has been NPO/clear liquids x 8 days and has been without adequate nutrition throughout hospital admission. May need to consider initiation of TPN if unable to advance diet.  Labs reviewed.  Diet Order:  Diet NPO time specified  Skin:  Reviewed, no issues  Last BM:  04/17/15  Height:   Ht Readings from Last 1 Encounters:  04/09/15 '5\' 5"'$  (1.651 m)    Weight:   Wt Readings from Last 1 Encounters:  04/09/15 170 lb (77.111  kg)    Ideal Body Weight:  56.8 kg  BMI:  Body mass index is 28.29 kg/(m^2).  Estimated Nutritional Needs:   Kcal:  1700-1900  Protein:  75-90 grams  Fluid:  1.7-1.9 L  EDUCATION NEEDS:   Education needs addressed  Batina Dougan A. Jimmye Norman, RD, LDN, CDE Pager: [phone] After hours Pager: [phone]

## 2015-04-18 NOTE — Progress Notes (Signed)
Report called into [place] for scheduled surgery.

## 2015-04-19 ENCOUNTER — Encounter (HOSPITAL_COMMUNITY): Payer: Self-pay | Admitting: General Surgery

## 2015-04-19 LAB — BASIC METABOLIC PANEL
Anion gap: 8 (ref 5–15)
BUN: <5 mg/dL — ABNORMAL LOW (ref 6–20)
CO2: 24 mmol/L (ref 22–32)
Calcium: 8.4 mg/dL — ABNORMAL LOW (ref 8.9–10.3)
Chloride: 109 mmol/L (ref 101–111)
Creatinine, Ser: 0.85 mg/dL (ref 0.44–1.00)
GFR calc Af Amer: >60 mL/min (ref >60)
GFR calc non Af Amer: >60 mL/min (ref >60)
Glucose, Bld: 131 mg/dL — ABNORMAL HIGH (ref 65–99)
Potassium: 3.8 mmol/L (ref 3.5–5.1)
Sodium: 141 mmol/L (ref 135–145)

## 2015-04-19 LAB — GLUCOSE, CAPILLARY
Glucose-Capillary: 133 mg/dL — ABNORMAL HIGH (ref 65–99)
Glucose-Capillary: 150 mg/dL — ABNORMAL HIGH (ref 65–99)
Glucose-Capillary: 121 mg/dL — ABNORMAL HIGH (ref 65–99)
Glucose-Capillary: 120 mg/dL — ABNORMAL HIGH (ref 65–99)
Glucose-Capillary: 106 mg/dL — ABNORMAL HIGH (ref 65–99)
Glucose-Capillary: 129 mg/dL — ABNORMAL HIGH (ref 65–99)

## 2015-04-19 LAB — CBC
HCT: 39.2 % (ref 36.0–46.0)
Hemoglobin: 13 g/dL (ref 12.0–15.0)
MCH: 29.5 pg (ref 26.0–34.0)
MCHC: 33.2 g/dL (ref 30.0–36.0)
MCV: 88.9 fL (ref 78.0–100.0)
Platelets: 248 10*3/uL (ref 150–400)
RBC: 4.41 MIL/uL (ref 3.87–5.11)
RDW: 14.1 % (ref 11.5–15.5)
WBC: 12.7 10*3/uL — ABNORMAL HIGH (ref 4.0–10.5)

## 2015-04-19 NOTE — Progress Notes (Signed)
PROGRESS NOTE  Angelica [name] VXY:[id number] DOB: 1940-09-26 DOA: 04/09/2015 PCP: Foye Spurling, MD  Brief narrative 74 year old female with hypertension, GERD, chronic abdominal pain (sees Dr. Watt Climes), history of hysterectomy presented to the ED with several days of abdominal pain with nausea and vomiting of clear source and constipated since past 2 days. Patient was found to have minimal abdominal tenderness on exam with CT of the abdomen showing small bowel obstruction with transition point in the mid lower pelvis. Admit to medical floor and surgery consulted. The patient was ultimately started on a clear liquid diet. Unfortunately, she continued to have vomiting. Abdominal x-ray on 04/13/2015 revealed persistent small bowel dilatation. As result, NG tube was placed to low intermittent suction.  Although the patient initially improved with NG tube suction, her bowel dilatation worsened again once it was clamped.  Ultimately, she was taken to surgery on 04/18/15.   Assessment/Plan: Partial small bowel obstruction -Possibly secondary to adhesions from prior surgery. . - IV hydration. Keep K >4.  -CCS following.  -Continue supportive care with when necessary pain medications and antiemetics. Minimize opioids -11/23--place NG to LIS-->no further vomiting -11/27--clamp NG tube -Remain NPO -switch essential meds to IV if possible -Continue IVF -04/17/15--AXR--continued improvement of bowel gas pattern -04/18/2015 AXR-- aggressive small bowel dilatation - 04/18/2015-- patient taken to surgery-- exposure laparotomy with lysis of adhesions-Dr. Rosendo Gros  Hypoglycemia -due to npo status -CBG checks q 4 hours -change to D5NS-->improved -am cortisol--10  Hypokalemia Replenished Keep K >4  Essential Hypertension Stable. Monitor on when necessary hydralazine  COPD -stable on RA -continue symbicort  Dispo--when bowel function returns Family--updated daughter at bedside  11/28   Procedures/Studies: Ct Abdomen Pelvis W Contrast  04/10/2015  CLINICAL DATA:  Nausea and vomiting today. Lower abdominal pain. History of abdominal surgeries. EXAM: CT ABDOMEN AND PELVIS WITH CONTRAST TECHNIQUE: Multidetector CT imaging of the abdomen and pelvis was performed using the standard protocol following bolus administration of intravenous contrast. CONTRAST:  53m OMNIPAQUE IOHEXOL 300 MG/ML  SOLN COMPARISON:  CT 02/17/2014 FINDINGS: Lower chest:  The included lung bases are clear. Liver: No focal hepatic lesion. Hepatobiliary: Gallbladder physiologically distended. Small wall calcification is unchanged, no calcified gallstone. No biliary dilatation. Pancreas: No ductal dilatation or inflammation. Spleen: Normal. Adrenal glands: No nodule. Kidneys: Symmetric renal enhancement and excretion. No hydronephrosis. No perinephric stranding or localizing renal abnormality. Stomach/Bowel: The stomach is decompressed. Proximal small bowel loops are nondistended. There is a moderate length segment of dilated fluid-filled small bowel loops in the lower abdomen/pelvis. Transition point seen in the midline lower pelvis image 60 (better appreciated on sagittal reformats image 90). There is a question of a more proximal transition point or posteriorly in the lower abdomen, image 52, however this is not definitive. Mild adjacent mesenteric edema and small volume of free fluid. No definite bowel wall thickening. No mesenteric swirling. No pneumatosis. Distal small bowel loops are decompressed. Small volume of stool throughout the colon without colonic wall thickening. Portions of the colon are not well defined given bowel dilatation and lack of enteric contrast. Appendix is not visualized. Vascular/Lymphatic: No retroperitoneal adenopathy. Abdominal aorta is normal in caliber. Mild atherosclerosis without aneurysm. Reproductive: Uterus presumably absent. No evidence of adnexal mass. Bladder: Physiologically  distended. Other: No free air free fluid, or intra-abdominal fluid collection. Scattered soft tissue granulomas. Musculoskeletal: There are no acute or suspicious osseous abnormalities. Multilevel degenerative change throughout the spine with degenerative disc disease and  facet arthropathy. 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 IMPRESSION: Small bowel obstruction with transition point in the mid mid lower pelvis. A more proximal second point is questioned but not definitive, and closed loop obstruction is not excluded. However early bowel obstruction is favored. Electronically Signed   By: Jeb Levering M.D.   On: 04/10/2015 00:15   Dg Abd 2 Views  04/18/2015  CLINICAL DATA:  Small bowel obstruction. EXAM: ABDOMEN - 2 VIEW COMPARISON:  Two-view abdomen 04/17/2015. FINDINGS: The side port of the NG tube is at or just above the GE junction. Dilated loops of small bowel have increased since the prior exam. Maximal dilation measures 4.5 cm on the supine image. Fluid levels are present on the upright images without free air. The lung bases are clear. Degenerative changes of the thoracolumbar spine are again noted. IMPRESSION: 1. Progressive dilation of small bowel compatible with worsening small bowel obstruction. 2. The side port of the NG tube is at or just above the GE junction. Electronically Signed   By: San Morelle M.D.   On: 04/18/2015 07:13   Dg Abd 2 Views  04/17/2015  CLINICAL DATA:  Small bowel obstruction. EXAM: ABDOMEN - 2 VIEW COMPARISON:  04/16/2015 and 04/14/2015 FINDINGS: Nasogastric tube unchanged with tip and side-port over the stomach in the left upper quadrant likely in the gastric fundus. Bowel gas pattern is improved as air is present throughout the colon. Single air-filled minimally dilated small bowel loop in the left mid abdomen improved. No evidence of free peritoneal air. Remainder of the exam is unchanged. IMPRESSION: Continued improvement in bowel gas pattern  with only single mildly dilated air-filled loop of small bowel in the left mid abdomen. Air throughout the colon. Nasogastric tube unchanged with tip and side-port over the gastric fundus. Electronically Signed   By: Marin Olp M.D.   On: 04/17/2015 09:38   Dg Abd 2 Views  04/16/2015  CLINICAL DATA:  Enteric tube placement.  Small bowel obstruction. EXAM: ABDOMEN - 2 VIEW COMPARISON:  04/14/2015 abdominal radiograph FINDINGS: Enteric tube loops in the proximal stomach and terminates in the proximal stomach, likely in the gastric fundus. There a few mildly dilated small bowel loops in the central and right lower abdomen, not appreciably changed. Mild colonic stool. No evidence of pneumatosis or pneumoperitoneum. There is mild retained oral contrast in the colon. IMPRESSION: 1. Enteric tube loops in and terminates in the proximal stomach, likely in the gastric fundus. 2. Mildly dilated central and right lower abdominal small bowel loops, not appreciably changed, suggesting ongoing mild partial small-bowel obstruction. Electronically Signed   By: Ilona Sorrel M.D.   On: 04/16/2015 09:01   Dg Abd 2 Views  04/14/2015  CLINICAL DATA:  Small bowel obstruction with emesis and pain EXAM: ABDOMEN - 2 VIEW COMPARISON:  April 13, 2015 FINDINGS: Supine and upright images obtained. There is dilated small bowel in the left mid abdomen. There are multiple air-fluid levels. A small amount of contrast is seen in the colon. No free air is apparent. There are phleboliths in the pelvis. There is moderate air in the stomach. IMPRESSION: The bowel gas pattern is consistent with a degree of obstruction. No free air apparent. Electronically Signed   By: Lowella Grip III M.D.   On: 04/14/2015 15:15   Dg Abd 2 Views  04/13/2015  CLINICAL DATA:  Abdominal pain with nausea and vomiting EXAM: ABDOMEN - 2 VIEW COMPARISON:  04/12/2015 FINDINGS: Stable small bowel dilatation is noted with  air-fluid levels. Contrast material  is again noted within the colon. No free air is seen. No abnormal mass is noted. Degenerative changes of the lumbar spine are seen. IMPRESSION: Persistent small bowel dilatation stable from the prior exam. Electronically Signed   By: Inez Catalina M.D.   On: 04/13/2015 13:33   Dg Abd Portable 1v  04/14/2015  CLINICAL DATA:  Patient status post NG tube placement. EXAM: PORTABLE ABDOMEN - 1 VIEW COMPARISON:  Abdominal radiograph 04/14/2015. FINDINGS: Enteric tube tip and side-port project over the stomach. Gas is demonstrated within the colon. Oral contrast material within the colon. Lung bases are clear. Lower thoracic and lumbar spine degenerative changes. IMPRESSION: NG tube tip and side-port project over the stomach. Electronically Signed   By: Lovey Newcomer M.D.   On: 04/14/2015 16:48   Dg Abd Portable 1v  04/12/2015  CLINICAL DATA:  Abdominal pain, follow up small bowel obstruction EXAM: PORTABLE ABDOMEN - 1 VIEW COMPARISON:  04/11/2015 FINDINGS: Persistent small bowel dilatation is noted. Contrast material is noted throughout the colon from previous administration. No new focal abnormality is seen. No free air is noted. IMPRESSION: Stable small bowel dilatation. Electronically Signed   By: Inez Catalina M.D.   On: 04/12/2015 09:43   Dg Abd Portable 1v-small Bowel Obstruction Protocol-24 Hr Delay  04/11/2015  CLINICAL DATA:  Small-bowel follow-through, 24 hour delay film EXAM: PORTABLE ABDOMEN - 1 VIEW COMPARISON:  04/09/2015 FINDINGS: Scattered large and small bowel gas is noted. Persistent small bowel dilatation is seen similar to that noted on the recent CT examination. Contrast material is noted throughout the colon. These changes would suggest a partial small bowel obstruction. No free air is seen. No other focal abnormality is noted. IMPRESSION: 24 hour delayed film shows contrast material within the colon indicating a partial small bowel obstruction. Persistent small bowel dilatation is noted.  Electronically Signed   By: Inez Catalina M.D.   On: 04/11/2015 07:10         Subjective: Patient denies fevers, chills, headache, chest pain, dyspnea, nausea, vomiting, diarrhea, abdominal pain, dysuria, hematuria.  No flatus or BM yet   Objective: Filed Vitals:   04/18/15 1552 04/18/15 2158 04/19/15 0409 04/19/15 1313  BP: 162/76 120/61 139/77 154/73  Pulse: 72 71 78 81  Temp: 98 F (36.7 C) 98.6 F (37 C) 98.5 F (36.9 C) 97.9 F (36.6 C)  TempSrc: Oral Oral Oral Oral  Resp: '16 18 18 14  '$ Height:      Weight:      SpO2: 99% 100% 100% 97%    Intake/Output Summary (Last 24 hours) at 04/19/15 1920 Last data filed at 04/19/15 1802  Gross per 24 hour  Intake    900 ml  Output   1250 ml  Net   -350 ml   Weight change:  Exam:   General:  Pt is alert, follows commands appropriately, not in acute distress  HEENT: No icterus, No thrush, No neck mass, Universal/AT  Cardiovascular: RRR, S1/S2, no rubs, no gallops  Respiratory: CTA bilaterally, no wheezing, no crackles, no rhonchi  Abdomen: Soft/+BS, non tender, non distended, no guarding  Extremities: No edema, No lymphangitis, No petechiae, No rashes, no synovitis  Data Reviewed: Basic Metabolic Panel:  Recent Labs Lab 04/15/15 0346 04/16/15 0600 04/17/15 0412 04/18/15 0750 04/19/15 0542  NA 138 141 141 142 141  K 4.1 3.9 4.8 4.1 3.8  CL 105 111 110 110 109  CO2 22 19* 18* 21* 24  GLUCOSE  76 68 50* 96 131*  BUN '20 15 11 6 '$ <5*  CREATININE 1.07* 1.01* 0.91 0.78 0.85  CALCIUM 9.2 8.8* 8.9 9.1 8.4*  MG 1.8  --   --   --   --   PHOS 3.2  --   --   --   --    Liver Function Tests: No results for input(s): AST, ALT, ALKPHOS, BILITOT, PROT, ALBUMIN in the last 168 hours. No results for input(s): LIPASE, AMYLASE in the last 168 hours. No results for input(s): AMMONIA in the last 168 hours. CBC:  Recent Labs Lab 04/14/15 0219 04/17/15 0412 04/18/15 0750 04/19/15 0542  WBC 8.5 10.2 11.0* 12.7*  HGB 15.8* 12.5  13.1 13.0  HCT 44.7 37.8 37.9 39.2  MCV 88.0 90.6 87.9 88.9  PLT 305 251 234 248   Cardiac Enzymes: No results for input(s): CKTOTAL, CKMB, CKMBINDEX, TROPONINI in the last 168 hours. BNP: Invalid input(s): POCBNP CBG:  Recent Labs Lab 04/19/15 0008 04/19/15 0407 04/19/15 0814 04/19/15 1216 04/19/15 1626  GLUCAP 150* 133* 121* 120* 106*    Recent Results (from the past 240 hour(s))  Surgical pcr screen     Status: None   Collection Time: 04/18/15 12:16 PM  Result Value Ref Range Status   MRSA, PCR NEGATIVE NEGATIVE Final   Staphylococcus aureus NEGATIVE NEGATIVE Final    Comment:        The Xpert SA Assay (FDA approved for NASAL specimens in patients over 69 years of age), is one component of a comprehensive surveillance program.  Test performance has been validated by [facility] for patients greater than or equal to 38 year old. It is not intended to diagnose infection nor to guide or monitor treatment.      Scheduled Meds: . azelastine  2 spray Each Nare Daily  . budesonide-formoterol  2 puff Inhalation Daily  . heparin subcutaneous  5,000 Units Subcutaneous 3 times per day  . metoCLOPramide (REGLAN) injection  10 mg Intravenous 3 times per day  . pantoprazole (PROTONIX) IV  40 mg Intravenous Q24H  . promethazine  12.5 mg Intravenous Once   Continuous Infusions: . dextrose 5 % and 0.9% NaCl 1,000 mL with potassium chloride 20 mEq infusion 75 mL/hr at 04/19/15 1114  . lactated ringers 50 mL/hr (04/18/15 1322)     Taylia Berber, DO  [facility] Pager [phone]  If 7PM-7AM, please contact night-coverage [email or web address] Password [email or web address] 04/19/2015, 7:20 PM   LOS: 9 days

## 2015-04-19 NOTE — Anesthesia Postprocedure Evaluation (Signed)
Anesthesia Post Note  Patient: Angelica [name]  Procedure(s) Performed: Procedure(s) (LRB): Exploratory laparotomy with lysis of adhesions, possible bowel resection (N/A)  Patient location during evaluation: PACU Anesthesia Type: General Level of consciousness: awake and alert and patient cooperative Pain management: pain level controlled Vital Signs Assessment: post-procedure vital signs reviewed and stable Respiratory status: spontaneous breathing and respiratory function stable Cardiovascular status: stable Anesthetic complications: no    Last Vitals:  Filed Vitals:   04/18/15 2158 04/19/15 0409  BP: 120/61 139/77  Pulse: 71 78  Temp: 37 C 36.9 C  Resp: 18 18    Last Pain:  Filed Vitals:   04/19/15 0839  PainSc: [place]

## 2015-04-19 NOTE — Progress Notes (Signed)
1 Day Post-Op  Subjective: Feeling well.  Says her belly feels better compared to pre-surgery. Complains of minimal generalized abdominal pain, using heating pad and taking pain medication PRN. Eager to get up and move today. Tolerating NG tube well. No BM or flatulence this morning. Complains of sore throat when she swallows.   Objective: Vital signs in last 24 hours: Temp:  [97.6 F (36.4 C)-98.6 F (37 C)] 98.5 F (36.9 C) (11/29 0409) Pulse Rate:  [71-88] 78 (11/29 0409) Resp:  [11-20] 18 (11/29 0409) BP: (120-164)/(61-83) 139/77 mmHg (11/29 0409) SpO2:  [99 %-100 %] 100 % (11/29 0409) Last BM Date: 04/17/15  Intake/Output from previous day: 2023/05/15 0701 - 11/29 0700 In: 1500 [I.V.:1500] Out: 975 [Urine:900; Blood:75] Intake/Output this shift:   General: Pleasant WDWN 74 year old female in NAD HEENT: Pupils PERRLA, sclera clear and not injected. NG tube on suction with 248m green fluid.  CV: RRR, no murmurs, rubs or gallops, normal S1 and S2 Pulm: CTAB, no wheezes, rales or rhonchi Abdomen: Soft, minimal tenderness to palpation, no masses noted, not distended. Midline incision healing well, honeycomb dressing present with minimal drainage. No bruising, inflammation, streaking or signs of infection noted. Extrm: Pulses present and strong b/l upper and lower. No edema, swelling or skin changes. SCDs.       Lab Results:   Recent Labs  1Dec 25, [id number] 04/19/15 0542  WBC 11.0* 12.7*  HGB 13.1 13.0  HCT 37.9 39.2  PLT 234 248   BMET  Recent Labs  112/25/20160750 04/19/15 0542  NA 142 141  K 4.1 3.8  CL 110 109  CO2 21* 24  GLUCOSE 96 131*  BUN 6 <5*  CREATININE 0.78 0.85  CALCIUM 9.1 8.4*   PT/INR No results for input(s): LABPROT, INR in the last 72 hours. ABG No results for input(s): PHART, HCO3 in the last 72 hours.  Invalid input(s): PCO2, PO2  Studies/Results: Dg Abd 2 Views  112-25-2016 CLINICAL DATA:  Small bowel obstruction. EXAM: ABDOMEN - 2 VIEW  COMPARISON:  Two-view abdomen 04/17/2015. FINDINGS: The side port of the NG tube is at or just above the GE junction. Dilated loops of small bowel have increased since the prior exam. Maximal dilation measures 4.5 cm on the supine image. Fluid levels are present on the upright images without free air. The lung bases are clear. Degenerative changes of the thoracolumbar spine are again noted. IMPRESSION: 1. Progressive dilation of small bowel compatible with worsening small bowel obstruction. 2. The side port of the NG tube is at or just above the GE junction. Electronically Signed   By: CSan MorelleM.D.   On: 125-Dec-201607:13   Dg Abd 2 Views  04/17/2015  CLINICAL DATA:  Small bowel obstruction. EXAM: ABDOMEN - 2 VIEW COMPARISON:  04/16/2015 and 04/14/2015 FINDINGS: Nasogastric tube unchanged with tip and side-port over the stomach in the left upper quadrant likely in the gastric fundus. Bowel gas pattern is improved as air is present throughout the colon. Single air-filled minimally dilated small bowel loop in the left mid abdomen improved. No evidence of free peritoneal air. Remainder of the exam is unchanged. IMPRESSION: Continued improvement in bowel gas pattern with only single mildly dilated air-filled loop of small bowel in the left mid abdomen. Air throughout the colon. Nasogastric tube unchanged with tip and side-port over the gastric fundus. Electronically Signed   By: DMarin OlpM.D.   On: 04/17/2015 09:38    Anti-infectives: Anti-infectives    Start  Dose/Rate Route Frequency Ordered Stop   04/18/15 1230  clindamycin (CLEOCIN) IVPB 900 mg     900 mg 100 mL/hr over 30 Minutes Intravenous To [facility] 04/18/15 1154 04/18/15 1401   04/18/15 1230  gentamicin (GARAMYCIN) 390 mg in dextrose 5 % 100 mL IVPB     5 mg/kg  77.1 kg 109.8 mL/hr over 60 Minutes Intravenous To ShortStay Surgical 04/18/15 1154 04/18/15 1405      Assessment/Plan: s/p Procedure(s): Exploratory  laparotomy with lysis of adhesions, possible bowel resection (N/A)  Continue NG tube Keep NPO, ice chips only.  Pain medication and antiemetics PRN Get her up and moving with assistance multiple times today as tolerated.  Continue SCDs for VTE prophylaxis Will reassess in a.m.    LOS: 9 days    [facility], PA-S 04/19/2015

## 2015-04-20 LAB — GLUCOSE, CAPILLARY
Glucose-Capillary: 104 mg/dL — ABNORMAL HIGH (ref 65–99)
Glucose-Capillary: 109 mg/dL — ABNORMAL HIGH (ref 65–99)
Glucose-Capillary: 120 mg/dL — ABNORMAL HIGH (ref 65–99)
Glucose-Capillary: 74 mg/dL (ref 65–99)
Glucose-Capillary: 96 mg/dL (ref 65–99)
Glucose-Capillary: 98 mg/dL (ref 65–99)

## 2015-04-20 LAB — BASIC METABOLIC PANEL
Anion gap: 10 (ref 5–15)
BUN: <5 mg/dL — ABNORMAL LOW (ref 6–20)
CO2: 20 mmol/L — ABNORMAL LOW (ref 22–32)
Calcium: 8.2 mg/dL — ABNORMAL LOW (ref 8.9–10.3)
Chloride: 110 mmol/L (ref 101–111)
Creatinine, Ser: 0.71 mg/dL (ref 0.44–1.00)
GFR calc Af Amer: >60 mL/min (ref >60)
GFR calc non Af Amer: >60 mL/min (ref >60)
Glucose, Bld: 113 mg/dL — ABNORMAL HIGH (ref 65–99)
Potassium: 3.9 mmol/L (ref 3.5–5.1)
Sodium: 140 mmol/L (ref 135–145)

## 2015-04-20 LAB — CBC
HCT: 36.3 % (ref 36.0–46.0)
Hemoglobin: 12.3 g/dL (ref 12.0–15.0)
MCH: 30.4 pg (ref 26.0–34.0)
MCHC: 33.9 g/dL (ref 30.0–36.0)
MCV: 89.6 fL (ref 78.0–100.0)
Platelets: 211 10*3/uL (ref 150–400)
RBC: 4.05 MIL/uL (ref 3.87–5.11)
RDW: 14.3 % (ref 11.5–15.5)
WBC: 9.1 10*3/uL (ref 4.0–10.5)

## 2015-04-20 NOTE — Progress Notes (Signed)
PROGRESS NOTE  Angelica [name] KKX:[id number] DOB: [birth year] DOA: 04/09/2015 PCP: Foye Spurling, MD  Brief narrative 74 year old female with hypertension, GERD, chronic abdominal pain (sees Dr. Watt Climes), history of hysterectomy presented to the ED with several days of abdominal pain with nausea and vomiting of clear source and constipated since past 2 days. Patient was found to have minimal abdominal tenderness on exam with CT of the abdomen showing small bowel obstruction with transition point in the mid lower pelvis. Admit to medical floor and surgery consulted. The patient was ultimately started on a clear liquid diet. Unfortunately, she continued to have vomiting. Abdominal x-ray on 04/13/2015 revealed persistent small bowel dilatation. As result, NG tube was placed to low intermittent suction.  Although the patient initially improved with NG tube suction, her bowel dilatation worsened again once it was clamped.  Ultimately, she was taken to surgery on 04/18/15.   Assessment/Plan: Small bowel obstruction -Secondary to adhesions from prior surgery. Failed conservative management and eneded having lysis of adhesions on 11/28. - IV hydration. Keep K >4 and Mg > 2.  -CCS following.  -continue NGT -Remain NPO -switch essential meds to IV if possible -Continue IVF and supportive care -diet advancement once intestine awake; will follow surgery rec's  Hypoglycemia -due to npo status -will follow CBG checks q 4 hours -continue D5NS  Hypokalemia -Goal is to keep K >4 -will replete as needed   Essential Hypertension -Stable. Monitor on when necessary hydralazine -patient still NPO  COPD -stable on RA -continue symbicort -no wheezing -continue use of flutter valve  Dispo--discharge when bowel function returns Family--updated [facility] after patient approved at bedside 11/28   Procedures/Studies: Ct Abdomen Pelvis W Contrast  04/10/2015  CLINICAL DATA:  Nausea and  vomiting today. Lower abdominal pain. History of abdominal surgeries. EXAM: CT ABDOMEN AND PELVIS WITH CONTRAST TECHNIQUE: Multidetector CT imaging of the abdomen and pelvis was performed using the standard protocol following bolus administration of intravenous contrast. CONTRAST:  45m OMNIPAQUE IOHEXOL 300 MG/ML  SOLN COMPARISON:  CT 02/17/2014 FINDINGS: Lower chest:  The included lung bases are clear. Liver: No focal hepatic lesion. Hepatobiliary: Gallbladder physiologically distended. Small wall calcification is unchanged, no calcified gallstone. No biliary dilatation. Pancreas: No ductal dilatation or inflammation. Spleen: Normal. Adrenal glands: No nodule. Kidneys: Symmetric renal enhancement and excretion. No hydronephrosis. No perinephric stranding or localizing renal abnormality. Stomach/Bowel: The stomach is decompressed. Proximal small bowel loops are nondistended. There is a moderate length segment of dilated fluid-filled small bowel loops in the lower abdomen/pelvis. Transition point seen in the midline lower pelvis image 60 (better appreciated on sagittal reformats image 90). There is a question of a more proximal transition point or posteriorly in the lower abdomen, image 52, however this is not definitive. Mild adjacent mesenteric edema and small volume of free fluid. No definite bowel wall thickening. No mesenteric swirling. No pneumatosis. Distal small bowel loops are decompressed. Small volume of stool throughout the colon without colonic wall thickening. Portions of the colon are not well defined given bowel dilatation and lack of enteric contrast. Appendix is not visualized. Vascular/Lymphatic: No retroperitoneal adenopathy. Abdominal aorta is normal in caliber. Mild atherosclerosis without aneurysm. Reproductive: Uterus presumably absent. No evidence of adnexal mass. Bladder: Physiologically distended. Other: No free air free fluid, or intra-abdominal fluid collection. Scattered soft tissue  granulomas. Musculoskeletal: There are no acute or suspicious osseous abnormalities. Multilevel degenerative change throughout the spine with degenerative disc disease and  facet arthropathy. 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 IMPRESSION: Small bowel obstruction with transition point in the mid mid lower pelvis. A more proximal second point is questioned but not definitive, and closed loop obstruction is not excluded. However early bowel obstruction is favored. Electronically Signed   By: Jeb Levering M.D.   On: 04/10/2015 00:15   Dg Abd 2 Views  04/18/2015  CLINICAL DATA:  Small bowel obstruction. EXAM: ABDOMEN - 2 VIEW COMPARISON:  Two-view abdomen 04/17/2015. FINDINGS: The side port of the NG tube is at or just above the GE junction. Dilated loops of small bowel have increased since the prior exam. Maximal dilation measures 4.5 cm on the supine image. Fluid levels are present on the upright images without free air. The lung bases are clear. Degenerative changes of the thoracolumbar spine are again noted. IMPRESSION: 1. Progressive dilation of small bowel compatible with worsening small bowel obstruction. 2. The side port of the NG tube is at or just above the GE junction. Electronically Signed   By: San Morelle M.D.   On: 04/18/2015 07:13   Dg Abd 2 Views  04/17/2015  CLINICAL DATA:  Small bowel obstruction. EXAM: ABDOMEN - 2 VIEW COMPARISON:  04/16/2015 and 04/14/2015 FINDINGS: Nasogastric tube unchanged with tip and side-port over the stomach in the left upper quadrant likely in the gastric fundus. Bowel gas pattern is improved as air is present throughout the colon. Single air-filled minimally dilated small bowel loop in the left mid abdomen improved. No evidence of free peritoneal air. Remainder of the exam is unchanged. IMPRESSION: Continued improvement in bowel gas pattern with only single mildly dilated air-filled loop of small bowel in the left mid abdomen. Air throughout  the colon. Nasogastric tube unchanged with tip and side-port over the gastric fundus. Electronically Signed   By: Marin Olp M.D.   On: 04/17/2015 09:38   Dg Abd 2 Views  04/16/2015  CLINICAL DATA:  Enteric tube placement.  Small bowel obstruction. EXAM: ABDOMEN - 2 VIEW COMPARISON:  04/14/2015 abdominal radiograph FINDINGS: Enteric tube loops in the proximal stomach and terminates in the proximal stomach, likely in the gastric fundus. There a few mildly dilated small bowel loops in the central and right lower abdomen, not appreciably changed. Mild colonic stool. No evidence of pneumatosis or pneumoperitoneum. There is mild retained oral contrast in the colon. IMPRESSION: 1. Enteric tube loops in and terminates in the proximal stomach, likely in the gastric fundus. 2. Mildly dilated central and right lower abdominal small bowel loops, not appreciably changed, suggesting ongoing mild partial small-bowel obstruction. Electronically Signed   By: Ilona Sorrel M.D.   On: 04/16/2015 09:01   Dg Abd 2 Views  04/14/2015  CLINICAL DATA:  Small bowel obstruction with emesis and pain EXAM: ABDOMEN - 2 VIEW COMPARISON:  April 13, 2015 FINDINGS: Supine and upright images obtained. There is dilated small bowel in the left mid abdomen. There are multiple air-fluid levels. A small amount of contrast is seen in the colon. No free air is apparent. There are phleboliths in the pelvis. There is moderate air in the stomach. IMPRESSION: The bowel gas pattern is consistent with a degree of obstruction. No free air apparent. Electronically Signed   By: Lowella Grip III M.D.   On: 04/14/2015 15:15   Dg Abd 2 Views  04/13/2015  CLINICAL DATA:  Abdominal pain with nausea and vomiting EXAM: ABDOMEN - 2 VIEW COMPARISON:  04/12/2015 FINDINGS: Stable small bowel dilatation is noted with  air-fluid levels. Contrast material is again noted within the colon. No free air is seen. No abnormal mass is noted. Degenerative changes of  the lumbar spine are seen. IMPRESSION: Persistent small bowel dilatation stable from the prior exam. Electronically Signed   By: Inez Catalina M.D.   On: 04/13/2015 13:33   Dg Abd Portable 1v  04/14/2015  CLINICAL DATA:  Patient status post NG tube placement. EXAM: PORTABLE ABDOMEN - 1 VIEW COMPARISON:  Abdominal radiograph 04/14/2015. FINDINGS: Enteric tube tip and side-port project over the stomach. Gas is demonstrated within the colon. Oral contrast material within the colon. Lung bases are clear. Lower thoracic and lumbar spine degenerative changes. IMPRESSION: NG tube tip and side-port project over the stomach. Electronically Signed   By: Lovey Newcomer M.D.   On: 04/14/2015 16:48   Dg Abd Portable 1v  04/12/2015  CLINICAL DATA:  Abdominal pain, follow up small bowel obstruction EXAM: PORTABLE ABDOMEN - 1 VIEW COMPARISON:  04/11/2015 FINDINGS: Persistent small bowel dilatation is noted. Contrast material is noted throughout the colon from previous administration. No new focal abnormality is seen. No free air is noted. IMPRESSION: Stable small bowel dilatation. Electronically Signed   By: Inez Catalina M.D.   On: 04/12/2015 09:43   Dg Abd Portable 1v-small Bowel Obstruction Protocol-24 Hr Delay  04/11/2015  CLINICAL DATA:  Small-bowel follow-through, 24 hour delay film EXAM: PORTABLE ABDOMEN - 1 VIEW COMPARISON:  04/09/2015 FINDINGS: Scattered large and small bowel gas is noted. Persistent small bowel dilatation is seen similar to that noted on the recent CT examination. Contrast material is noted throughout the colon. These changes would suggest a partial small bowel obstruction. No free air is seen. No other focal abnormality is noted. IMPRESSION: 24 hour delayed film shows contrast material within the colon indicating a partial small bowel obstruction. Persistent small bowel dilatation is noted. Electronically Signed   By: Inez Catalina M.D.   On: 04/11/2015 07:10    Subjective: Patient denies  fevers, CP and SOB. No flatus or BM yet. Complaining of bloating sensation. No nausea or vomiting. NGT in place.  Objective: Filed Vitals:   04/20/15 0602 04/20/15 1202 04/20/15 1421 04/20/15 2206  BP: 133/93  128/82 129/59  Pulse: 80  89 81  Temp: 98.5 F (36.9 C)  98.6 F (37 C) 98.5 F (36.9 C)  TempSrc: Oral   Oral  Resp: 13  16   Height:      Weight:      SpO2: 98% 96% 99% 98%    Intake/Output Summary (Last 24 hours) at 04/20/15 2234 Last data filed at 04/20/15 1900  Gross per 24 hour  Intake 1811.25 ml  Output    550 ml  Net 1261.25 ml   Weight change:  Exam:   General:  Pt is alert, follows commands appropriately, not in acute distress. Complaining of right arm swelling (presumed IV line infiltration). Also complaining of bloating. No flatus and no BM yet.  HEENT: No icterus, No thrush, No neck mass, [place]/AT  Cardiovascular: RRR, S1/S2, no rubs, no gallops  Respiratory: CTA bilaterally, no wheezing, no crackles, no rhonchi  Abdomen: Soft, non tender, non distended, no guarding; incision dry, intact and w/o signs of infection. No bowel sounds on exam.  Extremities: No edema, No lymphangitis, No petechiae, No rashes, no synovitis  Data Reviewed: Basic Metabolic Panel:  Recent Labs Lab 04/15/15 0346 04/16/15 0600 04/17/15 0412 04/18/15 0750 04/19/15 0542 04/20/15 0410  NA 138 141 141 142 141 140  K 4.1 3.9 4.8 4.1 3.8 3.9  CL 105 111 110 110 109 110  CO2 22 19* 18* 21* 24 20*  GLUCOSE 76 68 50* 96 131* 113*  BUN '20 15 11 6 '$ <5* <5*  CREATININE 1.07* 1.01* 0.91 0.78 0.85 0.71  CALCIUM 9.2 8.8* 8.9 9.1 8.4* 8.2*  MG 1.8  --   --   --   --   --   PHOS 3.2  --   --   --   --   --    CBC:  Recent Labs Lab 04/14/15 0219 04/17/15 0412 04/18/15 0750 04/19/15 0542 04/20/15 0410  WBC 8.5 10.2 11.0* 12.7* 9.1  HGB 15.8* 12.5 13.1 13.0 12.3  HCT 44.7 37.8 37.9 39.2 36.3  MCV 88.0 90.6 87.9 88.9 89.6  PLT 305 251 234 248 211   Cardiac Enzymes: No  results for input(s): CKTOTAL, CKMB, CKMBINDEX, TROPONINI in the last 168 hours. BNP: Invalid input(s): POCBNP CBG:  Recent Labs Lab 04/20/15 0341 04/20/15 0800 04/20/15 1221 04/20/15 1549 04/20/15 1956  GLUCAP 109* 96 104* 74 98    Recent Results (from the past 240 hour(s))  Surgical pcr screen     Status: None   Collection Time: 04/18/15 12:16 PM  Result Value Ref Range Status   MRSA, PCR NEGATIVE NEGATIVE Final   Staphylococcus aureus NEGATIVE NEGATIVE Final    Comment:        The Xpert SA Assay (FDA approved for NASAL specimens in patients over 31 years of age), is one component of a comprehensive surveillance program.  Test performance has been validated by [facility] for patients greater than or equal to 81 year old. It is not intended to diagnose infection nor to guide or monitor treatment.      Scheduled Meds: . azelastine  2 spray Each Nare Daily  . budesonide-formoterol  2 puff Inhalation Daily  . heparin subcutaneous  5,000 Units Subcutaneous 3 times per day  . metoCLOPramide (REGLAN) injection  10 mg Intravenous 3 times per day  . pantoprazole (PROTONIX) IV  40 mg Intravenous Q24H  . promethazine  12.5 mg Intravenous Once   Continuous Infusions: . dextrose 5 % and 0.9% NaCl 1,000 mL with potassium chloride 20 mEq infusion 75 mL/hr at 04/20/15 2102  . lactated ringers 50 mL/hr (04/18/15 1322)     Barton Dubois, MD  [facility] Pager [phone]  If 7PM-7AM, please contact night-coverage [email or web address] Password [email or web address] 04/20/2015, 10:34 PM   LOS: 10 days

## 2015-04-20 NOTE — Progress Notes (Signed)
2 Days Post-Op  Subjective: Doing ok today. No BM or flatulence yet. Complains of generalized abdominal pain, worse with movement. States she walked down the hall with the assistance of a walker one time yesterday. Tolerating NG tube. Complains of sore throat when she swallows, using Chloracetic spray with some relief.   Objective: Vital signs in last 24 hours: Temp:  [97.9 F (36.6 C)-98.5 F (36.9 C)] 98.5 F (36.9 C) (11/30 0602) Pulse Rate:  [80-84] 80 (11/30 0602) Resp:  [13-20] 13 (11/30 0602) BP: (133-154)/(73-93) 133/93 mmHg (11/30 0602) SpO2:  [96 %-98 %] 98 % (11/30 0602) Last BM Date: 04/17/15  Intake/Output from previous day: 11/29 0701 - 11/30 0700 In: 1848.8 [P.O.:50; I.V.:1798.8] Out: 1100 [Urine:900; Emesis/NG output:200] Intake/Output this shift:   General: WDWN pleasant woman sitting up in her chair, NAD HEENT: NG tube on suction relieving green fluid.  CV: RRR, no murmurs, rubs or gallops, normal S1 and S2 Pulm: CTAB, No wheezes, rales or rhonchi  GI: Abdomen soft, not distended, mild tenderness to palpation. Midline incision clean, dry and in tact with no drainage, erythema or streaking. Bowel sounds present in all four quadrants. Extrm: Pulses present and strong upper and lower, b/l. No edema or skin changes.   Lab Results:   Recent Labs  04/19/15 0542 04/20/15 0410  WBC 12.7* 9.1  HGB 13.0 12.3  HCT 39.2 36.3  PLT 248 211   BMET  Recent Labs  04/19/15 0542 04/20/15 0410  NA 141 140  K 3.8 3.9  CL 109 110  CO2 24 20*  GLUCOSE 131* 113*  BUN <5* <5*  CREATININE 0.85 0.71  CALCIUM 8.4* 8.2*   PT/INR No results for input(s): LABPROT, INR in the last 72 hours. ABG No results for input(s): PHART, HCO3 in the last 72 hours.  Invalid input(s): PCO2, PO2  Studies/Results: No results found.  Anti-infectives: Anti-infectives    Start     Dose/Rate Route Frequency Ordered Stop   04/18/15 1230  clindamycin (CLEOCIN) IVPB 900 mg     900  mg 100 mL/hr over 30 Minutes Intravenous To ShortStay Surgical 04/18/15 1154 04/18/15 1401   04/18/15 1230  gentamicin (GARAMYCIN) 390 mg in dextrose 5 % 100 mL IVPB     5 mg/kg  77.1 kg 109.8 mL/hr over 60 Minutes Intravenous To ShortStay Surgical 04/18/15 1154 04/18/15 1405      Assessment/Plan: s/p Procedure(s): Exploratory laparotomy with lysis of adhesions, possible bowel resection (N/A) Continue NG tube, ice chips only OOB walking with PRN walker three times today  Pulm toilet Pain and nausea meds PRN Continue SCDs for VTE prophylaxis  Will reassess in a.m.  LOS: 10 days    [employer], PA-S 04/20/2015

## 2015-04-20 NOTE — Consult Note (Signed)
   [facility] Inpatient Consult   04/20/2015  [name] Feb 03, 1941 [id number] Patient evaluated for community based chronic disease management services with [facility] as a benefit of patient's [facility] [[employer]] Medicare Insurance. Met with patient at bedside to explain [facility] services. Patient states that her daughter lives with her and works from home and can provide 24 hour support.  Patient states she would like post hospital follow up when she is discharged due to her long length of hospital stay.  Patient states she has transportation to appointments and she was driving prior to admission.  She is not sure if her physician will allow her to drive immediately from her discharge back home. Consent form signed. Patient will receive post hospital discharge call and will be evaluated for monthly home visits for assessments and disease process education.  Left contact information and [facility] literature at bedside. Made Inpatient Case Manager aware that [facility] following. Of note, [facility] Care Management services does not replace or interfere with any services that are arranged by inpatient case management or social work.  For additional questions or referrals please contact:   Natividad Brood, RN BSN [facility] Liaison  [phone] business mobile phone

## 2015-04-21 ENCOUNTER — Other Ambulatory Visit: Payer: Self-pay

## 2015-04-21 LAB — GLUCOSE, CAPILLARY
Glucose-Capillary: 85 mg/dL (ref 65–99)
Glucose-Capillary: 87 mg/dL (ref 65–99)
Glucose-Capillary: 91 mg/dL (ref 65–99)
Glucose-Capillary: 96 mg/dL (ref 65–99)
Glucose-Capillary: 96 mg/dL (ref 65–99)
Glucose-Capillary: 97 mg/dL (ref 65–99)

## 2015-04-21 LAB — BASIC METABOLIC PANEL
Anion gap: 7 (ref 5–15)
BUN: <5 mg/dL — ABNORMAL LOW (ref 6–20)
CO2: 26 mmol/L (ref 22–32)
Calcium: 8.3 mg/dL — ABNORMAL LOW (ref 8.9–10.3)
Chloride: 107 mmol/L (ref 101–111)
Creatinine, Ser: 0.79 mg/dL (ref 0.44–1.00)
GFR calc Af Amer: >60 mL/min (ref >60)
GFR calc non Af Amer: >60 mL/min (ref >60)
Glucose, Bld: 105 mg/dL — ABNORMAL HIGH (ref 65–99)
Potassium: 3.5 mmol/L (ref 3.5–5.1)
Sodium: 140 mmol/L (ref 135–145)

## 2015-04-21 LAB — MAGNESIUM: Magnesium: 1.4 mg/dL — ABNORMAL LOW (ref 1.7–2.4)

## 2015-04-21 MED ORDER — POTASSIUM CHLORIDE 10 MEQ/100ML IV SOLN
10.0000 meq | INTRAVENOUS | Status: AC
  Administered 2015-04-21 (×2): 10 meq via INTRAVENOUS
  Filled 2015-04-21 (×2): qty 100

## 2015-04-21 NOTE — Progress Notes (Signed)
[facility] Progress Note  3 Days Post-Op  Subjective: Pt feels very good, not much pain.  No N/V, abdominal distension improved.   Doesn't know if she's passed flatus, no BM yet.  Says her stomach is rumbling a lot.  Ambulating well, using IS up to 1300.  Objective: Vital signs in last 24 hours: Temp:  [97.4 F (36.3 C)-98.6 F (37 C)] 97.4 F (36.3 C) (12/01 0600) Pulse Rate:  [76-89] 76 (12/01 0600) Resp:  [16] 16 (11/30 1421) BP: (128-150)/(59-95) 150/95 mmHg (12/01 0600) SpO2:  [96 %-100 %] 98 % (12/01 0910) Last BM Date: 04/17/15  Intake/Output from previous day: 11/30 0701 - 12/01 0700 In: 1772.5 [I.V.:1772.5] Out: 200 [Emesis/NG output:200] Intake/Output this shift:    PE: Gen:  Alert, NAD, pleasant Abd: Soft, NT/ND, Great BS, no HSM, incisions C/D/I with staples in place, minimal sanguinous drainage on honeycomb.   Lab Results:   Recent Labs  04/19/15 0542 04/20/15 0410  WBC 12.7* 9.1  HGB 13.0 12.3  HCT 39.2 36.3  PLT 248 211   BMET  Recent Labs  04/20/15 0410 04/21/15 0542  NA 140 140  K 3.9 3.5  CL 110 107  CO2 20* 26  GLUCOSE 113* 105*  BUN <5* <5*  CREATININE 0.71 0.79  CALCIUM 8.2* 8.3*   PT/INR No results for input(s): LABPROT, INR in the last 72 hours. CMP     Component Value Date/Time   NA 140 04/21/2015 0542   K 3.5 04/21/2015 0542   CL 107 04/21/2015 0542   CO2 26 04/21/2015 0542   GLUCOSE 105* 04/21/2015 0542   BUN <5* 04/21/2015 0542   CREATININE 0.79 04/21/2015 0542   CALCIUM 8.3* 04/21/2015 0542   PROT 7.2 04/09/2015 2148   ALBUMIN 3.6 04/09/2015 2148   AST 25 04/09/2015 2148   ALT 13* 04/09/2015 2148   ALKPHOS 75 04/09/2015 2148   BILITOT 0.6 04/09/2015 2148   GFRNONAA >60 04/21/2015 0542   GFRAA >60 04/21/2015 0542   Lipase     Component Value Date/Time   LIPASE 25 04/09/2015 2148       Studies/Results: No results found.  Anti-infectives: Anti-infectives    Start     Dose/Rate Route  Frequency Ordered Stop   04/18/15 1230  clindamycin (CLEOCIN) IVPB 900 mg     900 mg 100 mL/hr over 30 Minutes Intravenous To ShortStay Surgical 04/18/15 1154 04/18/15 1401   04/18/15 1230  gentamicin (GARAMYCIN) 390 mg in dextrose 5 % 100 mL IVPB     5 mg/kg  77.1 kg 109.8 mL/hr over 60 Minutes Intravenous To ShortStay Surgical 04/18/15 1154 04/18/15 1405       Assessment/Plan SBO secondary to adhesive band POD #3 s/p Ex lap with LOA -NG tube, NPO, IVF, pain control, antiemetics -Clamp NG tube and allow sips of clears, continue clamping trial for 24 hours. -Mobilization and IS (up to 1300) -D/c heparin, cont SCD's -Change dressing to midline wound  Chronic abdominal pain - followed by Dr. Watt Climes H/o laparoscopic assisted vaginal hysterectomy with b/l salpingo-oophorectomy with LOA 07/2001    LOS: 11 days    Nat Christen 04/21/2015, 9:52 AM Pager: [phone]

## 2015-04-21 NOTE — Progress Notes (Signed)
Nutrition Follow-up  DOCUMENTATION CODES:   Non-severe (moderate) malnutrition in context of acute illness/injury  INTERVENTION:   -RD will follow for diet advancement -If prolonged NPO/clear liquid status is anticipated, consider initiation of TPN  NUTRITION DIAGNOSIS:   Malnutrition related to acute illness as evidenced by percent weight loss, mild depletion of body fat, mild depletion of muscle mass.  Ongoing  GOAL:   Patient will meet greater than or equal to 90% of their needs  Unmet  MONITOR:   PO intake, Supplement acceptance, Diet advancement, Labs, Weight trends, Skin, I & O's  REASON FOR ASSESSMENT:   Malnutrition Screening Tool    ASSESSMENT:   74 year old female with hypertension, GERD, chronic abdominal pain (sees Dr. Watt Climes), history of hysterectomy presented to the ED with several days of abdominal pain with nausea and vomiting of clear source and constipated since past 2 days. Patient was found to have minimal abdominal tenderness on exam with CT of the abdomen showing small bowel obstruction with transition point in the mid lower pelvis. Admit to medical floor and surgery consulted.  s/p Procedure(s) 04/18/15: Exploratory laparotomy with lysis of adhesions  Pt remains with NGT; noted 200 ml output within the past 24 hours. Per surgical team, plan to clamp NGT today for clamping trial. Pt will be allowed sips and chips.   Noted pt has been NPO/clear liquids for entire hospitalization (11 days); may need to consider initiation of TPN if prolonged NPO/clear liquid status is expected.   Labs reviewed: Mg: 1.4.   Diet Order:  Diet NPO time specified Except for: Ice Chips, Other (See Comments)  Skin:  Reviewed, no issues  Last BM:  04/17/15  Height:   Ht Readings from Last 1 Encounters:  04/09/15 '5\' 5"'$  (1.651 m)    Weight:   Wt Readings from Last 1 Encounters:  04/09/15 170 lb (77.111 kg)    Ideal Body Weight:  56.8 kg  BMI:  Body mass index  is 28.29 kg/(m^2).  Estimated Nutritional Needs:   Kcal:  1700-1900  Protein:  75-90 grams  Fluid:  1.7-1.9 L  EDUCATION NEEDS:   Education needs addressed  Yumiko Alkins A. Jimmye Norman, RD, LDN, CDE Pager: [phone] After hours Pager: [phone]

## 2015-04-21 NOTE — Progress Notes (Signed)
PROGRESS NOTE  Angelica [name] PNT:[id number] DOB: 1940-06-02 DOA: 04/09/2015 PCP: Foye Spurling, MD  Brief narrative 74 year old female with hypertension, GERD, chronic abdominal pain (sees Dr. Watt Climes), history of hysterectomy presented to the ED with several days of abdominal pain with nausea and vomiting of clear source and constipated since past 2 days. Patient was found to have minimal abdominal tenderness on exam with CT of the abdomen showing small bowel obstruction with transition point in the mid lower pelvis. Admit to medical floor and surgery consulted. The patient was ultimately started on a clear liquid diet. Unfortunately, she continued to have vomiting. Abdominal x-ray on 04/13/2015 revealed persistent small bowel dilatation. As result, NG tube was placed to low intermittent suction.  Although the patient initially improved with NG tube suction, her bowel dilatation worsened again once it was clamped.  Ultimately, she was taken to surgery on 04/18/15.   Assessment/Plan: Small bowel obstruction -Secondary to adhesions from prior surgery. Failed conservative management and eneded having lysis of adhesions on 11/28. - IV hydration. Keep K >4 and Mg > 2.  -CCS following.  -continue NGT; with clamping trial and allowance for sips and Ice chips -Remain NPO -switch essential meds to IV if possible -Continue supportive care -diet advancement once intestine awake; will follow surgery rec's  Hypoglycemia -due to npo status -will follow CBG checks q 4-6 hours while NPO -continue D5NS  Hypokalemia -Goal is to keep K >4 -will replete as needed   Essential Hypertension -Stable. Monitor on when necessary hydralazine -patient still NPO  COPD -stable on RA -continue symbicort -no wheezing -continue use of flutter valve  Dispo--discharge when bowel function returns Family--updated Past[facility] after patient approved at bedside 11/28   Procedures/Studies: Ct Abdomen  Pelvis W Contrast  04/10/2015  CLINICAL DATA:  Nausea and vomiting today. Lower abdominal pain. History of abdominal surgeries. EXAM: CT ABDOMEN AND PELVIS WITH CONTRAST TECHNIQUE: Multidetector CT imaging of the abdomen and pelvis was performed using the standard protocol following bolus administration of intravenous contrast. CONTRAST:  40m OMNIPAQUE IOHEXOL 300 MG/ML  SOLN COMPARISON:  CT 02/17/2014 FINDINGS: Lower chest:  The included lung bases are clear. Liver: No focal hepatic lesion. Hepatobiliary: Gallbladder physiologically distended. Small wall calcification is unchanged, no calcified gallstone. No biliary dilatation. Pancreas: No ductal dilatation or inflammation. Spleen: Normal. Adrenal glands: No nodule. Kidneys: Symmetric renal enhancement and excretion. No hydronephrosis. No perinephric stranding or localizing renal abnormality. Stomach/Bowel: The stomach is decompressed. Proximal small bowel loops are nondistended. There is a moderate length segment of dilated fluid-filled small bowel loops in the lower abdomen/pelvis. Transition point seen in the midline lower pelvis image 60 (better appreciated on sagittal reformats image 90). There is a question of a more proximal transition point or posteriorly in the lower abdomen, image 52, however this is not definitive. Mild adjacent mesenteric edema and small volume of free fluid. No definite bowel wall thickening. No mesenteric swirling. No pneumatosis. Distal small bowel loops are decompressed. Small volume of stool throughout the colon without colonic wall thickening. Portions of the colon are not well defined given bowel dilatation and lack of enteric contrast. Appendix is not visualized. Vascular/Lymphatic: No retroperitoneal adenopathy. Abdominal aorta is normal in caliber. Mild atherosclerosis without aneurysm. Reproductive: Uterus presumably absent. No evidence of adnexal mass. Bladder: Physiologically distended. Other: No free air free fluid, or  intra-abdominal fluid collection. Scattered soft tissue granulomas. Musculoskeletal: There are no acute or suspicious osseous abnormalities. Multilevel  degenerative change throughout the spine with degenerative disc disease and facet arthropathy. 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 IMPRESSION: Small bowel obstruction with transition point in the mid mid lower pelvis. A more proximal second point is questioned but not definitive, and closed loop obstruction is not excluded. However early bowel obstruction is favored. Electronically Signed   By: Jeb Levering M.D.   On: 04/10/2015 00:15   Dg Abd 2 Views  04/18/2015  CLINICAL DATA:  Small bowel obstruction. EXAM: ABDOMEN - 2 VIEW COMPARISON:  Two-view abdomen 04/17/2015. FINDINGS: The side port of the NG tube is at or just above the GE junction. Dilated loops of small bowel have increased since the prior exam. Maximal dilation measures 4.5 cm on the supine image. Fluid levels are present on the upright images without free air. The lung bases are clear. Degenerative changes of the thoracolumbar spine are again noted. IMPRESSION: 1. Progressive dilation of small bowel compatible with worsening small bowel obstruction. 2. The side port of the NG tube is at or just above the GE junction. Electronically Signed   By: San Morelle M.D.   On: 04/18/2015 07:13   Dg Abd 2 Views  04/17/2015  CLINICAL DATA:  Small bowel obstruction. EXAM: ABDOMEN - 2 VIEW COMPARISON:  04/16/2015 and 04/14/2015 FINDINGS: Nasogastric tube unchanged with tip and side-port over the stomach in the left upper quadrant likely in the gastric fundus. Bowel gas pattern is improved as air is present throughout the colon. Single air-filled minimally dilated small bowel loop in the left mid abdomen improved. No evidence of free peritoneal air. Remainder of the exam is unchanged. IMPRESSION: Continued improvement in bowel gas pattern with only single mildly dilated air-filled loop  of small bowel in the left mid abdomen. Air throughout the colon. Nasogastric tube unchanged with tip and side-port over the gastric fundus. Electronically Signed   By: Marin Olp M.D.   On: 04/17/2015 09:38   Dg Abd 2 Views  04/16/2015  CLINICAL DATA:  Enteric tube placement.  Small bowel obstruction. EXAM: ABDOMEN - 2 VIEW COMPARISON:  04/14/2015 abdominal radiograph FINDINGS: Enteric tube loops in the proximal stomach and terminates in the proximal stomach, likely in the gastric fundus. There a few mildly dilated small bowel loops in the central and right lower abdomen, not appreciably changed. Mild colonic stool. No evidence of pneumatosis or pneumoperitoneum. There is mild retained oral contrast in the colon. IMPRESSION: 1. Enteric tube loops in and terminates in the proximal stomach, likely in the gastric fundus. 2. Mildly dilated central and right lower abdominal small bowel loops, not appreciably changed, suggesting ongoing mild partial small-bowel obstruction. Electronically Signed   By: Ilona Sorrel M.D.   On: 04/16/2015 09:01   Dg Abd 2 Views  04/14/2015  CLINICAL DATA:  Small bowel obstruction with emesis and pain EXAM: ABDOMEN - 2 VIEW COMPARISON:  April 13, 2015 FINDINGS: Supine and upright images obtained. There is dilated small bowel in the left mid abdomen. There are multiple air-fluid levels. A small amount of contrast is seen in the colon. No free air is apparent. There are phleboliths in the pelvis. There is moderate air in the stomach. IMPRESSION: The bowel gas pattern is consistent with a degree of obstruction. No free air apparent. Electronically Signed   By: Lowella Grip III M.D.   On: 04/14/2015 15:15   Dg Abd 2 Views  04/13/2015  CLINICAL DATA:  Abdominal pain with nausea and vomiting EXAM: ABDOMEN - 2 VIEW COMPARISON:  04/12/2015 FINDINGS: Stable small bowel dilatation is noted with air-fluid levels. Contrast material is again noted within the colon. No free air is  seen. No abnormal mass is noted. Degenerative changes of the lumbar spine are seen. IMPRESSION: Persistent small bowel dilatation stable from the prior exam. Electronically Signed   By: Inez Catalina M.D.   On: 04/13/2015 13:33   Dg Abd Portable 1v  04/14/2015  CLINICAL DATA:  Patient status post NG tube placement. EXAM: PORTABLE ABDOMEN - 1 VIEW COMPARISON:  Abdominal radiograph 04/14/2015. FINDINGS: Enteric tube tip and side-port project over the stomach. Gas is demonstrated within the colon. Oral contrast material within the colon. Lung bases are clear. Lower thoracic and lumbar spine degenerative changes. IMPRESSION: NG tube tip and side-port project over the stomach. Electronically Signed   By: Lovey Newcomer M.D.   On: 04/14/2015 16:48   Dg Abd Portable 1v  04/12/2015  CLINICAL DATA:  Abdominal pain, follow up small bowel obstruction EXAM: PORTABLE ABDOMEN - 1 VIEW COMPARISON:  04/11/2015 FINDINGS: Persistent small bowel dilatation is noted. Contrast material is noted throughout the colon from previous administration. No new focal abnormality is seen. No free air is noted. IMPRESSION: Stable small bowel dilatation. Electronically Signed   By: Inez Catalina M.D.   On: 04/12/2015 09:43   Dg Abd Portable 1v-small Bowel Obstruction Protocol-24 Hr Delay  04/11/2015  CLINICAL DATA:  Small-bowel follow-through, 24 hour delay film EXAM: PORTABLE ABDOMEN - 1 VIEW COMPARISON:  04/09/2015 FINDINGS: Scattered large and small bowel gas is noted. Persistent small bowel dilatation is seen similar to that noted on the recent CT examination. Contrast material is noted throughout the colon. These changes would suggest a partial small bowel obstruction. No free air is seen. No other focal abnormality is noted. IMPRESSION: 24 hour delayed film shows contrast material within the colon indicating a partial small bowel obstruction. Persistent small bowel dilatation is noted. Electronically Signed   By: Inez Catalina M.D.   On:  04/11/2015 07:10    Subjective: Patient denies fevers, CP and SOB. No flatus or BM yet. NGT in place.  Objective: Filed Vitals:   04/20/15 2206 04/21/15 0600 04/21/15 0910 04/21/15 1512  BP: 129/59 150/95  115/42  Pulse: 81 76  76  Temp: 98.5 F (36.9 C) 97.4 F (36.3 C)  97.6 F (36.4 C)  TempSrc: Oral Oral  Oral  Resp:    16  Height:      Weight:      SpO2: 98% 100% 98% 99%    Intake/Output Summary (Last 24 hours) at 04/21/15 1653 Last data filed at 04/21/15 1546  Gross per 24 hour  Intake 1892.5 ml  Output    200 ml  Net 1692.5 ml   Weight change:  Exam:   General:  Pt is alert, follows commands appropriately, not in acute distress. Still w/o flatus or BM. NGT in place and going trough clamping trial.  HEENT: No icterus, No thrush, No neck mass, [place]/AT  Cardiovascular: RRR, S1/S2, no rubs, no gallops  Respiratory: CTA bilaterally, no wheezing, no crackles, no rhonchi  Abdomen: Soft, non tender, non distended, no guarding; incision dry, intact and w/o signs of infection. No bowel sounds on exam.  Extremities: No edema, No lymphangitis, No petechiae, No rashes, no synovitis  Data Reviewed: Basic Metabolic Panel:  Recent Labs Lab 04/15/15 0346  04/17/15 0412 04/18/15 0750 04/19/15 0542 04/20/15 0410 04/21/15 0542  NA 138  < > 141 142 141 140 140  K 4.1  < > 4.8 4.1 3.8 3.9 3.5  CL 105  < > 110 110 109 110 107  CO2 22  < > 18* 21* 24 20* 26  GLUCOSE 76  < > 50* 96 131* 113* 105*  BUN 20  < > 11 6 <5* <5* <5*  CREATININE 1.07*  < > 0.91 0.78 0.85 0.71 0.79  CALCIUM 9.2  < > 8.9 9.1 8.4* 8.2* 8.3*  MG 1.8  --   --   --   --   --  1.4*  PHOS 3.2  --   --   --   --   --   --   < > = values in this interval not displayed. CBC:  Recent Labs Lab 04/17/15 0412 04/18/15 0750 04/19/15 0542 04/20/15 0410  WBC 10.2 11.0* 12.7* 9.1  HGB 12.5 13.1 13.0 12.3  HCT 37.8 37.9 39.2 36.3  MCV 90.6 87.9 88.9 89.6  PLT 251 234 248 211   CBG:  Recent  Labs Lab 04/20/15 1956 04/21/15 0009 04/21/15 0430 04/21/15 0802 04/21/15 1218  GLUCAP 98 96 97 87 91    Recent Results (from the past 240 hour(s))  Surgical pcr screen     Status: None   Collection Time: 04/18/15 12:16 PM  Result Value Ref Range Status   MRSA, PCR NEGATIVE NEGATIVE Final   Staphylococcus aureus NEGATIVE NEGATIVE Final    Comment:        The Xpert SA Assay (FDA approved for NASAL specimens in patients over 41 years of age), is one component of a comprehensive surveillance program.  Test performance has been validated by [facility] for patients greater than or equal to 45 year old. It is not intended to diagnose infection nor to guide or monitor treatment.      Scheduled Meds: . azelastine  2 spray Each Nare Daily  . budesonide-formoterol  2 puff Inhalation Daily  . heparin subcutaneous  5,000 Units Subcutaneous 3 times per day  . metoCLOPramide (REGLAN) injection  10 mg Intravenous 3 times per day  . pantoprazole (PROTONIX) IV  40 mg Intravenous Q24H  . promethazine  12.5 mg Intravenous Once   Continuous Infusions: . dextrose 5 % and 0.9% NaCl 1,000 mL with potassium chloride 20 mEq infusion 75 mL/hr at 04/21/15 1604     Barton Dubois, MD  [facility] Pager [phone]  If 7PM-7AM, please contact night-coverage [email or web address] Password [email or web address] 04/21/2015, 4:53 PM   LOS: 11 days

## 2015-04-22 DIAGNOSIS — E876 Hypokalemia: Secondary | ICD-10-CM

## 2015-04-22 LAB — GLUCOSE, CAPILLARY
Glucose-Capillary: 112 mg/dL — ABNORMAL HIGH (ref 65–99)
Glucose-Capillary: 113 mg/dL — ABNORMAL HIGH (ref 65–99)
Glucose-Capillary: 115 mg/dL — ABNORMAL HIGH (ref 65–99)
Glucose-Capillary: 136 mg/dL — ABNORMAL HIGH (ref 65–99)
Glucose-Capillary: 93 mg/dL (ref 65–99)
Glucose-Capillary: 97 mg/dL (ref 65–99)
Glucose-Capillary: 98 mg/dL (ref 65–99)

## 2015-04-22 MED ORDER — DOCUSATE SODIUM 100 MG PO CAPS
100.0000 mg | ORAL_CAPSULE | Freq: Two times a day (BID) | ORAL | Status: DC
  Administered 2015-04-22 – 2015-04-24 (×5): 100 mg via ORAL
  Filled 2015-04-22 (×6): qty 1

## 2015-04-22 MED ORDER — ENSURE ENLIVE PO LIQD
237.0000 mL | Freq: Two times a day (BID) | ORAL | Status: DC
  Administered 2015-04-22 – 2015-04-24 (×4): 237 mL via ORAL

## 2015-04-22 MED ORDER — HYDROCODONE-ACETAMINOPHEN 5-325 MG PO TABS
1.0000 | ORAL_TABLET | ORAL | Status: DC | PRN
  Administered 2015-04-22 – 2015-04-23 (×3): 2 via ORAL
  Filled 2015-04-22 (×3): qty 2

## 2015-04-22 MED ORDER — POLYETHYLENE GLYCOL 3350 17 G PO PACK
17.0000 g | PACK | Freq: Every day | ORAL | Status: DC | PRN
Start: 2015-04-22 — End: 2015-04-24

## 2015-04-22 NOTE — Progress Notes (Signed)
4 Days Post-Op  Subjective: Pt w/ + BM No abd pain  Objective: Vital signs in last 24 hours: Temp:  [97.6 F (36.4 C)-98.2 F (36.8 C)] 98.1 F (36.7 C) (12/02 0556) Pulse Rate:  [74-76] 74 (12/02 0556) Resp:  [16-18] 18 (12/01 2123) BP: (115-154)/(42-85) 154/85 mmHg (12/02 0556) SpO2:  [98 %-100 %] 100 % (12/02 0556) Last BM Date: 04/21/15  Intake/Output from previous day: 12/01 0701 - 12/02 0700 In: 320 [P.O.:120; IV Piggyback:200] Out: 11 [Emesis/NG output:10; Stool:1] Intake/Output this shift:    General appearance: alert and cooperative GI: soft, non-tender; bowel sounds normal; no masses,  no organomegaly and incision c/d/i  Lab Results:   Recent Labs  04/20/15 0410  WBC 9.1  HGB 12.3  HCT 36.3  PLT 211   BMET  Recent Labs  04/20/15 0410 04/21/15 0542  NA 140 140  K 3.9 3.5  CL 110 107  CO2 20* 26  GLUCOSE 113* 105*  BUN <5* <5*  CREATININE 0.71 0.79  CALCIUM 8.2* 8.3*   PT/INR No results for input(s): LABPROT, INR in the last 72 hours. ABG No results for input(s): PHART, HCO3 in the last 72 hours.  Invalid input(s): PCO2, PO2  Studies/Results: No results found.  Anti-infectives: Anti-infectives    Start     Dose/Rate Route Frequency Ordered Stop   04/18/15 1230  clindamycin (CLEOCIN) IVPB 900 mg     900 mg 100 mL/hr over 30 Minutes Intravenous To [facility] 04/18/15 1154 04/18/15 1401   04/18/15 1230  gentamicin (GARAMYCIN) 390 mg in dextrose 5 % 100 mL IVPB     5 mg/kg  77.1 kg 109.8 mL/hr over 60 Minutes Intravenous To [facility]Stay [facility] 04/18/15 1154 04/18/15 1405      Assessment/Plan: s/p Procedure(s): Exploratory laparotomy with lysis of adhesions, possible bowel resection (N/A) Advance diet  DC NGt   LOS: 12 days    [name]., Anne Hahn 04/22/2015

## 2015-04-22 NOTE — Progress Notes (Signed)
PROGRESS NOTE  Angelica [name] QZR:[id number] DOB: 11-01-1940 DOA: 04/09/2015 PCP: Foye Spurling, MD  Brief narrative 74 year old female with hypertension, GERD, chronic abdominal pain (sees Dr. Watt Climes), history of hysterectomy presented to the ED with several days of abdominal pain with nausea and vomiting of clear source and constipated since past 2 days. Patient was found to have minimal abdominal tenderness on exam with CT of the abdomen showing small bowel obstruction with transition point in the mid lower pelvis. Admit to medical floor and surgery consulted. The patient was ultimately started on a clear liquid diet. Unfortunately, she continued to have vomiting. Abdominal x-ray on 04/13/2015 revealed persistent small bowel dilatation. As result, NG tube was placed to low intermittent suction.  Although the patient initially improved with NG tube suction, her bowel dilatation worsened again once it was clamped. Ultimately, she was taken to surgery on 04/18/15. Slowly progressing and intestine waking up.  Assessment/Plan: Small bowel obstruction -Secondary to adhesions from prior surgery.  -Failed conservative management and eneded having lysis of adhesions on 11/28. - IV hydration. Keep K >4 and Mg > 2.  -CCS following.  -NGT discontinued early today 12/2; had BM overnight (12/1) -continue IV medications until tolerating diet properly  -Continue supportive care -diet advanced to full liquid; will follow clinical progress and surgery rec's  Hypoglycemia -due to npo status -will follow CBG checks q 4-6 hours while NPO -continue D5NS -diet started to be advance, will follow up and adjust/discotninue IVF's when appropriate  Hypokalemia -Goal is to keep K >4 -will replete as needed  -BMET and Mg in am  Essential Hypertension -Stable. Monitor on when necessary hydralazine -patient started on full liquid diet on 12/2; will allow 24 hours to see if she tolerates it before  resuming PO meds  COPD -stable on RA -continue symbicort -no wheezing -continue use of flutter valve  Dispo--discharge home hopefully in the next 24-48 hours; when bowel function returns to normal Family--updated Past[facility] after patient approved at bedside 11/28   Procedures/Studies: Ct Abdomen Pelvis W Contrast  04/10/2015  CLINICAL DATA:  Nausea and vomiting today. Lower abdominal pain. History of abdominal surgeries. EXAM: CT ABDOMEN AND PELVIS WITH CONTRAST TECHNIQUE: Multidetector CT imaging of the abdomen and pelvis was performed using the standard protocol following bolus administration of intravenous contrast. CONTRAST:  38m OMNIPAQUE IOHEXOL 300 MG/ML  SOLN COMPARISON:  CT 02/17/2014 FINDINGS: Lower chest:  The included lung bases are clear. Liver: No focal hepatic lesion. Hepatobiliary: Gallbladder physiologically distended. Small wall calcification is unchanged, no calcified gallstone. No biliary dilatation. Pancreas: No ductal dilatation or inflammation. Spleen: Normal. Adrenal glands: No nodule. Kidneys: Symmetric renal enhancement and excretion. No hydronephrosis. No perinephric stranding or localizing renal abnormality. Stomach/Bowel: The stomach is decompressed. Proximal small bowel loops are nondistended. There is a moderate length segment of dilated fluid-filled small bowel loops in the lower abdomen/pelvis. Transition point seen in the midline lower pelvis image 60 (better appreciated on sagittal reformats image 90). There is a question of a more proximal transition point or posteriorly in the lower abdomen, image 52, however this is not definitive. Mild adjacent mesenteric edema and small volume of free fluid. No definite bowel wall thickening. No mesenteric swirling. No pneumatosis. Distal small bowel loops are decompressed. Small volume of stool throughout the colon without colonic wall thickening. Portions of the colon are not well defined given bowel dilatation and lack of  enteric contrast. Appendix is not visualized.  Vascular/Lymphatic: No retroperitoneal adenopathy. Abdominal aorta is normal in caliber. Mild atherosclerosis without aneurysm. Reproductive: Uterus presumably absent. No evidence of adnexal mass. Bladder: Physiologically distended. Other: No free air free fluid, or intra-abdominal fluid collection. Scattered soft tissue granulomas. Musculoskeletal: There are no acute or suspicious osseous abnormalities. Multilevel degenerative change throughout the spine with degenerative disc disease and facet arthropathy. 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 IMPRESSION: Small bowel obstruction with transition point in the mid mid lower pelvis. A more proximal second point is questioned but not definitive, and closed loop obstruction is not excluded. However early bowel obstruction is favored. Electronically Signed   By: Jeb Levering M.D.   On: 04/10/2015 00:15   Dg Abd 2 Views  04/18/2015  CLINICAL DATA:  Small bowel obstruction. EXAM: ABDOMEN - 2 VIEW COMPARISON:  Two-view abdomen 04/17/2015. FINDINGS: The side port of the NG tube is at or just above the GE junction. Dilated loops of small bowel have increased since the prior exam. Maximal dilation measures 4.5 cm on the supine image. Fluid levels are present on the upright images without free air. The lung bases are clear. Degenerative changes of the thoracolumbar spine are again noted. IMPRESSION: 1. Progressive dilation of small bowel compatible with worsening small bowel obstruction. 2. The side port of the NG tube is at or just above the GE junction. Electronically Signed   By: San Morelle M.D.   On: 04/18/2015 07:13   Dg Abd 2 Views  04/17/2015  CLINICAL DATA:  Small bowel obstruction. EXAM: ABDOMEN - 2 VIEW COMPARISON:  04/16/2015 and 04/14/2015 FINDINGS: Nasogastric tube unchanged with tip and side-port over the stomach in the left upper quadrant likely in the gastric fundus. Bowel gas pattern  is improved as air is present throughout the colon. Single air-filled minimally dilated small bowel loop in the left mid abdomen improved. No evidence of free peritoneal air. Remainder of the exam is unchanged. IMPRESSION: Continued improvement in bowel gas pattern with only single mildly dilated air-filled loop of small bowel in the left mid abdomen. Air throughout the colon. Nasogastric tube unchanged with tip and side-port over the gastric fundus. Electronically Signed   By: Marin Olp M.D.   On: 04/17/2015 09:38   Dg Abd 2 Views  04/16/2015  CLINICAL DATA:  Enteric tube placement.  Small bowel obstruction. EXAM: ABDOMEN - 2 VIEW COMPARISON:  04/14/2015 abdominal radiograph FINDINGS: Enteric tube loops in the proximal stomach and terminates in the proximal stomach, likely in the gastric fundus. There a few mildly dilated small bowel loops in the central and right lower abdomen, not appreciably changed. Mild colonic stool. No evidence of pneumatosis or pneumoperitoneum. There is mild retained oral contrast in the colon. IMPRESSION: 1. Enteric tube loops in and terminates in the proximal stomach, likely in the gastric fundus. 2. Mildly dilated central and right lower abdominal small bowel loops, not appreciably changed, suggesting ongoing mild partial small-bowel obstruction. Electronically Signed   By: Ilona Sorrel M.D.   On: 04/16/2015 09:01   Dg Abd 2 Views  04/14/2015  CLINICAL DATA:  Small bowel obstruction with emesis and pain EXAM: ABDOMEN - 2 VIEW COMPARISON:  April 13, 2015 FINDINGS: Supine and upright images obtained. There is dilated small bowel in the left mid abdomen. There are multiple air-fluid levels. A small amount of contrast is seen in the colon. No free air is apparent. There are phleboliths in the pelvis. There is moderate air in the stomach. IMPRESSION: The bowel gas pattern  is consistent with a degree of obstruction. No free air apparent. Electronically Signed   By: Lowella Grip III M.D.   On: 04/14/2015 15:15   Dg Abd 2 Views  04/13/2015  CLINICAL DATA:  Abdominal pain with nausea and vomiting EXAM: ABDOMEN - 2 VIEW COMPARISON:  04/12/2015 FINDINGS: Stable small bowel dilatation is noted with air-fluid levels. Contrast material is again noted within the colon. No free air is seen. No abnormal mass is noted. Degenerative changes of the lumbar spine are seen. IMPRESSION: Persistent small bowel dilatation stable from the prior exam. Electronically Signed   By: Inez Catalina M.D.   On: 04/13/2015 13:33   Dg Abd Portable 1v  04/14/2015  CLINICAL DATA:  Patient status post NG tube placement. EXAM: PORTABLE ABDOMEN - 1 VIEW COMPARISON:  Abdominal radiograph 04/14/2015. FINDINGS: Enteric tube tip and side-port project over the stomach. Gas is demonstrated within the colon. Oral contrast material within the colon. Lung bases are clear. Lower thoracic and lumbar spine degenerative changes. IMPRESSION: NG tube tip and side-port project over the stomach. Electronically Signed   By: Lovey Newcomer M.D.   On: 04/14/2015 16:48   Dg Abd Portable 1v  04/12/2015  CLINICAL DATA:  Abdominal pain, follow up small bowel obstruction EXAM: PORTABLE ABDOMEN - 1 VIEW COMPARISON:  04/11/2015 FINDINGS: Persistent small bowel dilatation is noted. Contrast material is noted throughout the colon from previous administration. No new focal abnormality is seen. No free air is noted. IMPRESSION: Stable small bowel dilatation. Electronically Signed   By: Inez Catalina M.D.   On: 04/12/2015 09:43   Dg Abd Portable 1v-small Bowel Obstruction Protocol-24 Hr Delay  04/11/2015  CLINICAL DATA:  Small-bowel follow-through, 24 hour delay film EXAM: PORTABLE ABDOMEN - 1 VIEW COMPARISON:  04/09/2015 FINDINGS: Scattered large and small bowel gas is noted. Persistent small bowel dilatation is seen similar to that noted on the recent CT examination. Contrast material is noted throughout the colon. These changes would  suggest a partial small bowel obstruction. No free air is seen. No other focal abnormality is noted. IMPRESSION: 24 hour delayed film shows contrast material within the colon indicating a partial small bowel obstruction. Persistent small bowel dilatation is noted. Electronically Signed   By: Inez Catalina M.D.   On: 04/11/2015 07:10    Subjective: Patient denies fevers, CP and SOB.  No nausea, no vomiting and denies abd pain. Have BM overnight and her HGT was removed early this morning. On full liquid diet trial.  Objective: Filed Vitals:   04/21/15 2123 04/22/15 0556 04/22/15 0940 04/22/15 1442  BP: 145/75 154/85  125/78  Pulse: 75 74 80 67  Temp: 98.2 F (36.8 C) 98.1 F (36.7 C)  98 F (36.7 C)  TempSrc: Oral Oral  Oral  Resp: '18  16 16  '$ Height:      Weight:      SpO2: 99% 100% 99% 97%    Intake/Output Summary (Last 24 hours) at 04/22/15 1844 Last data filed at 04/22/15 1541  Gross per 24 hour  Intake    420 ml  Output    300 ml  Net    120 ml   Weight change:  Exam:   General:  Pt is alert, follows commands appropriately, not in acute distress. Reports positive BM overnight and denies N/V. NGT removed early this morning and has been allowed to received full liquid diet. So far well tolerated.   HEENT: No icterus, No thrush, No neck mass, [place]/AT  Cardiovascular: RRR, S1/S2, no rubs, no gallops  Respiratory: CTA bilaterally, no wheezing, no crackles, no rhonchi  Abdomen: Soft, non tender, non distended, no guarding; incision dry, intact and w/o signs of infection. Decrease BS  Extremities: No edema, No lymphangitis, No petechiae, No rashes, no synovitis  Data Reviewed: Basic Metabolic Panel:  Recent Labs Lab 04/17/15 0412 04/18/15 0750 04/19/15 0542 04/20/15 0410 04/21/15 0542  NA 141 142 141 140 140  K 4.8 4.1 3.8 3.9 3.5  CL 110 110 109 110 107  CO2 18* 21* 24 20* 26  GLUCOSE 50* 96 131* 113* 105*  BUN 11 6 <5* <5* <5*  CREATININE 0.91 0.78 0.85 0.71  0.79  CALCIUM 8.9 9.1 8.4* 8.2* 8.3*  MG  --   --   --   --  1.4*   CBC:  Recent Labs Lab 04/17/15 0412 04/18/15 0750 04/19/15 0542 04/20/15 0410  WBC 10.2 11.0* 12.7* 9.1  HGB 12.5 13.1 13.0 12.3  HCT 37.8 37.9 39.2 36.3  MCV 90.6 87.9 88.9 89.6  PLT 251 234 248 211   CBG:  Recent Labs Lab 04/22/15 0010 04/22/15 0419 04/22/15 0802 04/22/15 1208 04/22/15 1657  GLUCAP 93 97 112* 98 136*    Recent Results (from the past 240 hour(s))  Surgical pcr screen     Status: None   Collection Time: 04/18/15 12:16 PM  Result Value Ref Range Status   MRSA, PCR NEGATIVE NEGATIVE Final   Staphylococcus aureus NEGATIVE NEGATIVE Final    Comment:        The Xpert SA Assay (FDA approved for NASAL specimens in patients over 38 years of age), is one component of a comprehensive surveillance program.  Test performance has been validated by [facility] for patients greater than or equal to 46 year old. It is not intended to diagnose infection nor to guide or monitor treatment.      Scheduled Meds: . azelastine  2 spray Each Nare Daily  . budesonide-formoterol  2 puff Inhalation Daily  . docusate sodium  100 mg Oral BID  . feeding supplement (ENSURE ENLIVE)  237 mL Oral BID BM  . heparin subcutaneous  5,000 Units Subcutaneous 3 times per day  . metoCLOPramide (REGLAN) injection  10 mg Intravenous 3 times per day  . pantoprazole (PROTONIX) IV  40 mg Intravenous Q24H  . promethazine  12.5 mg Intravenous Once   Continuous Infusions: . dextrose 5 % and 0.9% NaCl 1,000 mL with potassium chloride 20 mEq infusion 50 mL/hr at 04/22/15 0947     Barton Dubois, MD  [facility] Pager [phone]  If 7PM-7AM, please contact night-coverage [email or web address] Password [email or web address] 04/22/2015, 6:44 PM   LOS: 12 days

## 2015-04-22 NOTE — Progress Notes (Signed)
Nutrition Follow-up  DOCUMENTATION CODES:   Non-severe (moderate) malnutrition in context of acute illness/injury  INTERVENTION:   -Ensure Enlive po BID, each supplement provides 350 kcal and 20 grams of protein  NUTRITION DIAGNOSIS:   Malnutrition related to acute illness as evidenced by percent weight loss, mild depletion of body fat, mild depletion of muscle mass.  Ongoing  GOAL:   Patient will meet greater than or equal to 90% of their needs  Progressing  MONITOR:   PO intake, Supplement acceptance, Diet advancement, Labs, Weight trends, Skin, I & O's  REASON FOR ASSESSMENT:   Malnutrition Screening Tool    ASSESSMENT:   74 year old female with hypertension, GERD, chronic abdominal pain (sees Dr. Watt Climes), history of hysterectomy presented to the ED with several days of abdominal pain with nausea and vomiting of clear source and constipated since past 2 days. Patient was found to have minimal abdominal tenderness on exam with CT of the abdomen showing small bowel obstruction with transition point in the mid lower pelvis. Admit to medical floor and surgery consulted.  s/p Procedure(s) 04/18/15: Exploratory laparotomy with lysis of adhesions  Pt underwent clamping trial on 04/21/15. NGT removed by RN this AM. Pt was advanced to a full liquid diet this AM. Will add supplement for additional nutrition support.   Labs reviewed: CBGS: 85-112.  Diet Order:  Diet full liquid Room service appropriate?: Yes; Fluid consistency:: Thin  Skin:  Reviewed, no issues  Last BM:  04/21/15  Height:   Ht Readings from Last 1 Encounters:  04/09/15 '5\' 5"'$  (1.651 m)    Weight:   Wt Readings from Last 1 Encounters:  04/09/15 170 lb (77.111 kg)    Ideal Body Weight:  56.8 kg  BMI:  Body mass index is 28.29 kg/(m^2).  Estimated Nutritional Needs:   Kcal:  1700-1900  Protein:  75-90 grams  Fluid:  1.7-1.9 L  EDUCATION NEEDS:   Education needs addressed  Tejay Hubert A.  Jimmye Norman, RD, LDN, CDE Pager: [phone] After hours Pager: [phone]

## 2015-04-22 NOTE — Progress Notes (Signed)
NG tube removed, patient tolerated well. No complaints offered at this time.

## 2015-04-22 NOTE — Care Management Important Message (Signed)
Important Message  Patient Details  Name: Angelica [name] MRN: [id number] Date of Birth: [birth year]   Medicare Important Message Given:  Yes    Barb Merino Cutler 04/22/2015, 2:21 PM

## 2015-04-23 LAB — GLUCOSE, CAPILLARY
Glucose-Capillary: 95 mg/dL (ref 65–99)
Glucose-Capillary: 96 mg/dL (ref 65–99)
Glucose-Capillary: 93 mg/dL (ref 65–99)
Glucose-Capillary: 108 mg/dL — ABNORMAL HIGH (ref 65–99)
Glucose-Capillary: 117 mg/dL — ABNORMAL HIGH (ref 65–99)

## 2015-04-23 LAB — BASIC METABOLIC PANEL
Anion gap: 8 (ref 5–15)
BUN: <5 mg/dL — ABNORMAL LOW (ref 6–20)
CO2: 26 mmol/L (ref 22–32)
Calcium: 8.4 mg/dL — ABNORMAL LOW (ref 8.9–10.3)
Chloride: 105 mmol/L (ref 101–111)
Creatinine, Ser: 0.68 mg/dL (ref 0.44–1.00)
GFR calc Af Amer: >60 mL/min (ref >60)
GFR calc non Af Amer: >60 mL/min (ref >60)
Glucose, Bld: 90 mg/dL (ref 65–99)
Potassium: 3.6 mmol/L (ref 3.5–5.1)
Sodium: 139 mmol/L (ref 135–145)

## 2015-04-23 LAB — MAGNESIUM: Magnesium: 1.3 mg/dL — ABNORMAL LOW (ref 1.7–2.4)

## 2015-04-23 MED ORDER — PANTOPRAZOLE SODIUM 40 MG PO TBEC
40.0000 mg | DELAYED_RELEASE_TABLET | Freq: Every day | ORAL | Status: DC
  Administered 2015-04-23 – 2015-04-24 (×2): 40 mg via ORAL
  Filled 2015-04-23 (×2): qty 1

## 2015-04-23 MED ORDER — METOCLOPRAMIDE HCL 10 MG PO TABS
10.0000 mg | ORAL_TABLET | Freq: Three times a day (TID) | ORAL | Status: DC
  Administered 2015-04-23 – 2015-04-24 (×4): 10 mg via ORAL
  Filled 2015-04-23 (×4): qty 1

## 2015-04-23 MED ORDER — MAGNESIUM SULFATE 2 GM/50ML IV SOLN
2.0000 g | Freq: Once | INTRAVENOUS | Status: AC
  Administered 2015-04-23: 2 g via INTRAVENOUS
  Filled 2015-04-23: qty 50

## 2015-04-23 NOTE — Progress Notes (Signed)
5 Days Post-Op  Subjective: Pt doing well.  +BMs.  Tol PO  Objective: Vital signs in last 24 hours: Temp:  [97.6 F (36.4 C)-98 F (36.7 C)] 97.6 F (36.4 C) (12/03 0555) Pulse Rate:  [67-80] 73 (12/03 0555) Resp:  [15-16] 15 (12/03 0555) BP: (120-128)/(63-78) 120/64 mmHg (12/03 0555) SpO2:  [97 %-100 %] 100 % (12/03 0555) Last BM Date: 04/22/15  Intake/Output from previous day: 12/02 0701 - 12/03 0700 In: 220 [P.O.:220] Out: 300 [Urine:300] Intake/Output this shift:    General appearance: alert and cooperative GI: soft, non-tender; bowel sounds normal; no masses,  no organomegaly and incision c/d/i  Lab Results:  No results for input(s): WBC, HGB, HCT, PLT in the last 72 hours. BMET  Recent Labs  04/21/15 0542 04/23/15 0444  NA 140 139  K 3.5 3.6  CL 107 105  CO2 26 26  GLUCOSE 105* 90  BUN <5* <5*  CREATININE 0.79 0.68  CALCIUM 8.3* 8.4*   PT/INR No results for input(s): LABPROT, INR in the last 72 hours. ABG No results for input(s): PHART, HCO3 in the last 72 hours.  Invalid input(s): PCO2, PO2  Studies/Results: No results found.  Anti-infectives: Anti-infectives    Start     Dose/Rate Route Frequency Ordered Stop   04/18/15 1230  clindamycin (CLEOCIN) IVPB 900 mg     900 mg 100 mL/hr over 30 Minutes Intravenous To [facility] 04/18/15 1154 04/18/15 1401   04/18/15 1230  gentamicin (GARAMYCIN) 390 mg in dextrose 5 % 100 mL IVPB     5 mg/kg  77.1 kg 109.8 mL/hr over 60 Minutes Intravenous To [facility]Stay Surgical 04/18/15 1154 04/18/15 1405      Assessment/Plan: s/p Procedure(s): Exploratory laparotomy with lysis of adhesions, possible bowel resection (N/A) Advance diet to [place] when OK with Medicine F/u as instructed  LOS: 13 days    Angelica [name]., [facility] 04/23/2015

## 2015-04-23 NOTE — Progress Notes (Signed)
PROGRESS NOTE  Angelica [name] AXE:[id number] DOB: 1940-07-16 DOA: 04/09/2015 PCP: Foye Spurling, MD  Brief narrative 74 year old female with hypertension, GERD, chronic abdominal pain (sees Dr. Watt Climes), history of hysterectomy presented to the ED with several days of abdominal pain with nausea and vomiting of clear source and constipated since past 2 days. Patient was found to have minimal abdominal tenderness on exam with CT of the abdomen showing small bowel obstruction with transition point in the mid lower pelvis. Admit to medical floor and surgery consulted. The patient was ultimately started on a clear liquid diet. Unfortunately, she continued to have vomiting. Abdominal x-ray on 04/13/2015 revealed persistent small bowel dilatation. As result, NG tube was placed to low intermittent suction.  Although the patient initially improved with NG tube suction, her bowel dilatation worsened again once it was clamped. Ultimately, she was taken to surgery on 04/18/15. Slowly progressing and intestine waking up.   Assessment/Plan: Small bowel obstruction -Secondary to adhesions from prior surgery.  -Failed conservative management and eneded having lysis of adhesions on 11/28. - IV hydration. Keep K >4 and Mg > 2.  -CCS following.  -NGT discontinued  12/2;   -continue IV medications until tolerating diet properly , advance to regular diet today -Continue supportive care    Hypoglycemia resolved Continue to advance diet, continue D5 and potassium  Hypokalemia, hypomagnesemia -Goal is to keep K >4 Magnesium 1.3, replete and recheck   Essential Hypertension -Stable. Monitor on when necessary hydralazine -patient started on full liquid diet on 12/2; will allow 24 hours to see if she tolerates it before resuming PO meds  COPD -stable on RA -continue symbicort -no wheezing -continue use of flutter valve  Dispo--discharge home hopefully in the next 24-48 hours; when bowel  function returns to normal Family--updated [facility] after patient approved at bedside 11/28   Procedures/Studies: Ct Abdomen Pelvis W Contrast  04/10/2015  CLINICAL DATA:  Nausea and vomiting today. Lower abdominal pain. History of abdominal surgeries. EXAM: CT ABDOMEN AND PELVIS WITH CONTRAST TECHNIQUE: Multidetector CT imaging of the abdomen and pelvis was performed using the standard protocol following bolus administration of intravenous contrast. CONTRAST:  72m OMNIPAQUE IOHEXOL 300 MG/ML  SOLN COMPARISON:  CT 02/17/2014 FINDINGS: Lower chest:  The included lung bases are clear. Liver: No focal hepatic lesion. Hepatobiliary: Gallbladder physiologically distended. Small wall calcification is unchanged, no calcified gallstone. No biliary dilatation. Pancreas: No ductal dilatation or inflammation. Spleen: Normal. Adrenal glands: No nodule. Kidneys: Symmetric renal enhancement and excretion. No hydronephrosis. No perinephric stranding or localizing renal abnormality. Stomach/Bowel: The stomach is decompressed. Proximal small bowel loops are nondistended. There is a moderate length segment of dilated fluid-filled small bowel loops in the lower abdomen/pelvis. Transition point seen in the midline lower pelvis image 60 (better appreciated on sagittal reformats image 90). There is a question of a more proximal transition point or posteriorly in the lower abdomen, image 52, however this is not definitive. Mild adjacent mesenteric edema and small volume of free fluid. No definite bowel wall thickening. No mesenteric swirling. No pneumatosis. Distal small bowel loops are decompressed. Small volume of stool throughout the colon without colonic wall thickening. Portions of the colon are not well defined given bowel dilatation and lack of enteric contrast. Appendix is not visualized. Vascular/Lymphatic: No retroperitoneal adenopathy. Abdominal aorta is normal in caliber. Mild atherosclerosis without aneurysm.  Reproductive: Uterus presumably absent. No evidence of adnexal mass. Bladder: Physiologically distended. Other: No free  air free fluid, or intra-abdominal fluid collection. Scattered soft tissue granulomas. Musculoskeletal: There are no acute or suspicious osseous abnormalities. Multilevel degenerative change throughout the spine with degenerative disc disease and facet arthropathy. 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 IMPRESSION: Small bowel obstruction with transition point in the mid mid lower pelvis. A more proximal second point is questioned but not definitive, and closed loop obstruction is not excluded. However early bowel obstruction is favored. Electronically Signed   By: Jeb Levering M.D.   On: 04/10/2015 00:15   Dg Abd 2 Views  04/18/2015  CLINICAL DATA:  Small bowel obstruction. EXAM: ABDOMEN - 2 VIEW COMPARISON:  Two-view abdomen 04/17/2015. FINDINGS: The side port of the NG tube is at or just above the GE junction. Dilated loops of small bowel have increased since the prior exam. Maximal dilation measures 4.5 cm on the supine image. Fluid levels are present on the upright images without free air. The lung bases are clear. Degenerative changes of the thoracolumbar spine are again noted. IMPRESSION: 1. Progressive dilation of small bowel compatible with worsening small bowel obstruction. 2. The side port of the NG tube is at or just above the GE junction. Electronically Signed   By: San Morelle M.D.   On: 04/18/2015 07:13   Dg Abd 2 Views  04/17/2015  CLINICAL DATA:  Small bowel obstruction. EXAM: ABDOMEN - 2 VIEW COMPARISON:  04/16/2015 and 04/14/2015 FINDINGS: Nasogastric tube unchanged with tip and side-port over the stomach in the left upper quadrant likely in the gastric fundus. Bowel gas pattern is improved as air is present throughout the colon. Single air-filled minimally dilated small bowel loop in the left mid abdomen improved. No evidence of free peritoneal air.  Remainder of the exam is unchanged. IMPRESSION: Continued improvement in bowel gas pattern with only single mildly dilated air-filled loop of small bowel in the left mid abdomen. Air throughout the colon. Nasogastric tube unchanged with tip and side-port over the gastric fundus. Electronically Signed   By: Marin Olp M.D.   On: 04/17/2015 09:38   Dg Abd 2 Views  04/16/2015  CLINICAL DATA:  Enteric tube placement.  Small bowel obstruction. EXAM: ABDOMEN - 2 VIEW COMPARISON:  04/14/2015 abdominal radiograph FINDINGS: Enteric tube loops in the proximal stomach and terminates in the proximal stomach, likely in the gastric fundus. There a few mildly dilated small bowel loops in the central and right lower abdomen, not appreciably changed. Mild colonic stool. No evidence of pneumatosis or pneumoperitoneum. There is mild retained oral contrast in the colon. IMPRESSION: 1. Enteric tube loops in and terminates in the proximal stomach, likely in the gastric fundus. 2. Mildly dilated central and right lower abdominal small bowel loops, not appreciably changed, suggesting ongoing mild partial small-bowel obstruction. Electronically Signed   By: Ilona Sorrel M.D.   On: 04/16/2015 09:01   Dg Abd 2 Views  04/14/2015  CLINICAL DATA:  Small bowel obstruction with emesis and pain EXAM: ABDOMEN - 2 VIEW COMPARISON:  April 13, 2015 FINDINGS: Supine and upright images obtained. There is dilated small bowel in the left mid abdomen. There are multiple air-fluid levels. A small amount of contrast is seen in the colon. No free air is apparent. There are phleboliths in the pelvis. There is moderate air in the stomach. IMPRESSION: The bowel gas pattern is consistent with a degree of obstruction. No free air apparent. Electronically Signed   By: Lowella Grip III M.D.   On: 04/14/2015 15:15   Dg  Abd 2 Views  04/13/2015  CLINICAL DATA:  Abdominal pain with nausea and vomiting EXAM: ABDOMEN - 2 VIEW COMPARISON:  04/12/2015  FINDINGS: Stable small bowel dilatation is noted with air-fluid levels. Contrast material is again noted within the colon. No free air is seen. No abnormal mass is noted. Degenerative changes of the lumbar spine are seen. IMPRESSION: Persistent small bowel dilatation stable from the prior exam. Electronically Signed   By: Inez Catalina M.D.   On: 04/13/2015 13:33   Dg Abd Portable 1v  04/14/2015  CLINICAL DATA:  Patient status post NG tube placement. EXAM: PORTABLE ABDOMEN - 1 VIEW COMPARISON:  Abdominal radiograph 04/14/2015. FINDINGS: Enteric tube tip and side-port project over the stomach. Gas is demonstrated within the colon. Oral contrast material within the colon. Lung bases are clear. Lower thoracic and lumbar spine degenerative changes. IMPRESSION: NG tube tip and side-port project over the stomach. Electronically Signed   By: Lovey Newcomer M.D.   On: 04/14/2015 16:48   Dg Abd Portable 1v  04/12/2015  CLINICAL DATA:  Abdominal pain, follow up small bowel obstruction EXAM: PORTABLE ABDOMEN - 1 VIEW COMPARISON:  04/11/2015 FINDINGS: Persistent small bowel dilatation is noted. Contrast material is noted throughout the colon from previous administration. No new focal abnormality is seen. No free air is noted. IMPRESSION: Stable small bowel dilatation. Electronically Signed   By: Inez Catalina M.D.   On: 04/12/2015 09:43   Dg Abd Portable 1v-small Bowel Obstruction Protocol-24 Hr Delay  04/11/2015  CLINICAL DATA:  Small-bowel follow-through, 24 hour delay film EXAM: PORTABLE ABDOMEN - 1 VIEW COMPARISON:  04/09/2015 FINDINGS: Scattered large and small bowel gas is noted. Persistent small bowel dilatation is seen similar to that noted on the recent CT examination. Contrast material is noted throughout the colon. These changes would suggest a partial small bowel obstruction. No free air is seen. No other focal abnormality is noted. IMPRESSION: 24 hour delayed film shows contrast material within the colon  indicating a partial small bowel obstruction. Persistent small bowel dilatation is noted. Electronically Signed   By: Inez Catalina M.D.   On: 04/11/2015 07:10    Subjective: Patient denies fevers, CP and SOB.  No nausea, no vomiting and denies abd pain. Doing well, 2 bowel movements yesterday  Objective: Filed Vitals:   04/22/15 0940 04/22/15 1442 04/22/15 2204 04/23/15 0555  BP:  125/78 128/63 120/64  Pulse: 80 67 68 73  Temp:  98 F (36.7 C) 98 F (36.7 C) 97.6 F (36.4 C)  TempSrc:  Oral Oral Oral  Resp: '16 16 15 15  '$ Height:      Weight:      SpO2: 99% 97% 100% 100%    Intake/Output Summary (Last 24 hours) at 04/23/15 0858 Last data filed at 04/22/15 1541  Gross per 24 hour  Intake    220 ml  Output    300 ml  Net    -80 ml   Weight change:  Exam:   General:  Pt is alert, follows commands appropriately, not in acute distress. Reports positive BM overnight and denies N/V. NGT removed early this morning and has been allowed to received full liquid diet. So far well tolerated.   HEENT: No icterus, No thrush, No neck mass, [place]/AT  Cardiovascular: RRR, S1/S2, no rubs, no gallops  Respiratory: CTA bilaterally, no wheezing, no crackles, no rhonchi  Abdomen: Soft, non tender, non distended, no guarding; incision dry, intact and w/o signs of infection. Decrease BS  Extremities:  No edema, No lymphangitis, No petechiae, No rashes, no synovitis  Data Reviewed: Basic Metabolic Panel:  Recent Labs Lab 04/18/15 0750 04/19/15 0542 04/20/15 0410 04/21/15 0542 04/23/15 0444  NA 142 141 140 140 139  K 4.1 3.8 3.9 3.5 3.6  CL 110 109 110 107 105  CO2 21* 24 20* 26 26  GLUCOSE 96 131* 113* 105* 90  BUN 6 <5* <5* <5* <5*  CREATININE 0.78 0.85 0.71 0.79 0.68  CALCIUM 9.1 8.4* 8.2* 8.3* 8.4*  MG  --   --   --  1.4* 1.3*   CBC:  Recent Labs Lab 04/17/15 0412 04/18/15 0750 04/19/15 0542 04/20/15 0410  WBC 10.2 11.0* 12.7* 9.1  HGB 12.5 13.1 13.0 12.3  HCT 37.8 37.9  39.2 36.3  MCV 90.6 87.9 88.9 89.6  PLT 251 234 248 211   CBG:  Recent Labs Lab 04/22/15 1657 04/22/15 2009 04/22/15 2345 04/23/15 0554 04/23/15 0744  GLUCAP 136* 113* 115* 95 96    Recent Results (from the past 240 hour(s))  Surgical pcr screen     Status: None   Collection Time: 04/18/15 12:16 PM  Result Value Ref Range Status   MRSA, PCR NEGATIVE NEGATIVE Final   Staphylococcus aureus NEGATIVE NEGATIVE Final    Comment:        The Xpert SA Assay (FDA approved for NASAL specimens in patients over 71 years of age), is one component of a comprehensive surveillance program.  Test performance has been validated by [facility] for patients greater than or equal to 10 year old. It is not intended to diagnose infection nor to guide or monitor treatment.      Scheduled Meds: . azelastine  2 spray Each Nare Daily  . budesonide-formoterol  2 puff Inhalation Daily  . docusate sodium  100 mg Oral BID  . feeding supplement (ENSURE ENLIVE)  237 mL Oral BID BM  . heparin subcutaneous  5,000 Units Subcutaneous 3 times per day  . metoCLOPramide  10 mg Oral TID AC  . pantoprazole  40 mg Oral Daily  . promethazine  12.5 mg Intravenous Once   Continuous Infusions: . dextrose 5 % and 0.9% NaCl 1,000 mL with potassium chloride 20 mEq infusion 50 mL/hr at 04/23/15 0048     Reyne Dumas, MD  [facility] Pager [phone]  If 7PM-7AM, please contact night-coverage [email or web address] Password [email or web address] 04/23/2015, 8:58 AM   LOS: 13 days

## 2015-04-24 DIAGNOSIS — E440 Moderate protein-calorie malnutrition: Secondary | ICD-10-CM

## 2015-04-24 DIAGNOSIS — E162 Hypoglycemia, unspecified: Secondary | ICD-10-CM

## 2015-04-24 DIAGNOSIS — R1084 Generalized abdominal pain: Secondary | ICD-10-CM

## 2015-04-24 DIAGNOSIS — K5669 Other intestinal obstruction: Secondary | ICD-10-CM

## 2015-04-24 DIAGNOSIS — I10 Essential (primary) hypertension: Secondary | ICD-10-CM

## 2015-04-24 LAB — MAGNESIUM: Magnesium: 1.7 mg/dL (ref 1.7–2.4)

## 2015-04-24 LAB — COMPREHENSIVE METABOLIC PANEL
ALT: 55 U/L — ABNORMAL HIGH (ref 14–54)
AST: 30 U/L (ref 15–41)
Albumin: 2.6 g/dL — ABNORMAL LOW (ref 3.5–5.0)
Alkaline Phosphatase: 49 U/L (ref 38–126)
Anion gap: 7 (ref 5–15)
BUN: <5 mg/dL — ABNORMAL LOW (ref 6–20)
CO2: 25 mmol/L (ref 22–32)
Calcium: 8.5 mg/dL — ABNORMAL LOW (ref 8.9–10.3)
Chloride: 107 mmol/L (ref 101–111)
Creatinine, Ser: 0.76 mg/dL (ref 0.44–1.00)
GFR calc Af Amer: >60 mL/min (ref >60)
GFR calc non Af Amer: >60 mL/min (ref >60)
Glucose, Bld: 95 mg/dL (ref 65–99)
Potassium: 3.7 mmol/L (ref 3.5–5.1)
Sodium: 139 mmol/L (ref 135–145)
Total Bilirubin: 0.6 mg/dL (ref 0.3–1.2)
Total Protein: 5.3 g/dL — ABNORMAL LOW (ref 6.5–8.1)

## 2015-04-24 LAB — CBC
HCT: 36.8 % (ref 36.0–46.0)
Hemoglobin: 12.2 g/dL (ref 12.0–15.0)
MCH: 29.4 pg (ref 26.0–34.0)
MCHC: 33.2 g/dL (ref 30.0–36.0)
MCV: 88.7 fL (ref 78.0–100.0)
Platelets: 246 10*3/uL (ref 150–400)
RBC: 4.15 MIL/uL (ref 3.87–5.11)
RDW: 14 % (ref 11.5–15.5)
WBC: 10.2 10*3/uL (ref 4.0–10.5)

## 2015-04-24 LAB — GLUCOSE, CAPILLARY
Glucose-Capillary: 91 mg/dL (ref 65–99)
Glucose-Capillary: 99 mg/dL (ref 65–99)

## 2015-04-24 MED ORDER — OXYCODONE-ACETAMINOPHEN 5-325 MG PO TABS: 1.0000 | ORAL_TABLET | Freq: Four times a day (QID) | ORAL | Status: DC | PRN

## 2015-04-24 MED ORDER — DOCUSATE SODIUM 100 MG PO CAPS: 100.0000 mg | ORAL_CAPSULE | Freq: Two times a day (BID) | ORAL | Status: DC

## 2015-04-24 MED ORDER — PROMETHAZINE HCL 12.5 MG PO TABS
12.5000 mg | ORAL_TABLET | Freq: Four times a day (QID) | ORAL | Status: DC | PRN
Start: 2015-04-24 — End: 2015-12-08

## 2015-04-24 MED ORDER — POLYETHYLENE GLYCOL 3350 17 G PO PACK: 17.0000 g | PACK | Freq: Every day | ORAL | Status: DC | PRN

## 2015-04-24 MED ORDER — METOCLOPRAMIDE HCL 10 MG PO TABS: 10.0000 mg | ORAL_TABLET | Freq: Three times a day (TID) | ORAL | Status: DC

## 2015-04-24 MED ORDER — METHOCARBAMOL 500 MG PO TABS: 1000.0000 mg | ORAL_TABLET | Freq: Three times a day (TID) | ORAL | Status: DC | PRN

## 2015-04-24 MED ORDER — OXYCODONE-ACETAMINOPHEN 5-325 MG PO TABS
1.0000 | ORAL_TABLET | Freq: Four times a day (QID) | ORAL | Status: DC | PRN
  Administered 2015-04-24: 1 via ORAL
  Filled 2015-04-24: qty 1

## 2015-04-24 NOTE — Progress Notes (Signed)
Angelica [name] is D/C'd Home per MD order. Discussed with the patient and all questions fully answered.  VSS, Skin clean, dry and intact without evidence of skin break down, no evidence of skin tears noted. IV catheter discontinued intact. Site without signs and symptoms of complications. Dressing and pressure applied.  An After Visit Summary was printed and given to the patient. Prescriptions given to patient.  D/c education completed with patient/family including follow up instructions, medication list, d/c activities limitations if indicated, with other d/c instructions as indicated by MD - patient able to verbalize understanding, all questions fully answered.   Patient instructed to return to ED, call 911, or call MD for any changes in condition.   Patient escorted via wheelchari with nurse tech, and D/C home via private auto with son.

## 2015-04-24 NOTE — Progress Notes (Signed)
6 Days Post-Op  Subjective: BMx2-3 yesterday Tol PO   Objective: Vital signs in last 24 hours: Temp:  [97.6 F (36.4 C)-98.6 F (37 C)] 98.2 F (36.8 C) (12/04 4718) Pulse Rate:  [70-83] 83 (12/04 0614) Resp:  [18-19] 18 (12/04 0614) BP: (126-148)/(59-82) 148/82 mmHg (12/04 0614) SpO2:  [98 %-99 %] 99 % (12/04 0614) Last BM Date: 04/22/15  Intake/Output from previous day: 12/03 0701 - 12/04 0700 In: 1609.2 [P.O.:1040; I.V.:569.2] Out: 1900 [Urine:1900] Intake/Output this shift:    General appearance: alert and cooperative GI: soft, non-tender; bowel sounds normal; no masses,  no organomegaly and incision c/d/u  Lab Results:   Recent Labs  04/24/15 0604  WBC 10.2  HGB 12.2  HCT 36.8  PLT 246   BMET  Recent Labs  04/23/15 0444 04/24/15 0604  NA 139 139  K 3.6 3.7  CL 105 107  CO2 26 25  GLUCOSE 90 95  BUN <5* <5*  CREATININE 0.68 0.76  CALCIUM 8.4* 8.5*    Anti-infectives: Anti-infectives    Start     Dose/Rate Route Frequency Ordered Stop   04/18/15 1230  clindamycin (CLEOCIN) IVPB 900 mg     900 mg 100 mL/hr over 30 Minutes Intravenous To ShortStay [facility] 04/18/15 1154 04/18/15 1401   04/18/15 1230  gentamicin (GARAMYCIN) 390 mg in dextrose 5 % 100 mL IVPB     5 mg/kg  77.1 kg 109.8 mL/hr over 60 Minutes Intravenous To ShortStay Surgical 04/18/15 1154 04/18/15 1405      Assessment/Plan: s/p Procedure(s): Exploratory laparotomy with lysis of adhesions, possible bowel resection (N/A) Con't po  [place] home today  LOS: 14 days    [name]., Anne Hahn 04/24/2015

## 2015-04-24 NOTE — Discharge Summary (Signed)
Physician Discharge Summary   Patient ID: Angelica [name] MRN: [id number] DOB/AGE: 26-May-1940 74 y.o.  Admit date: 04/09/2015 Discharge date: 04/24/2015  Primary Care Physician:  Foye Spurling, MD  Discharge Diagnoses:    . SBO (small bowel obstruction) ([place]) . HTN (hypertension) COPD Hypokalemia Hypomagnesemia  Consults: Gen. surgery  Recommendations for Outpatient Follow-up:  1. Please repeat CBC/BMET at next visit    Heart healthy diet    Allergies:   Allergies  Allergen Reactions  . Asa Buff (Mag [Buffered Aspirin] Other (See Comments)    Excessive sweating  . Fish Allergy Other (See Comments)    Unknown reaction  . Other Other (See Comments)    Gelcaps; gel-containing capsules; extended release tablets  . Peanut-Containing Drug Products Other (See Comments)    From allergy test  . Penicillins Itching  . Shellfish-Derived Products Other (See Comments)    From allergy test  . Strawberry Extract Other (See Comments)    Worsened allergy symptoms     DISCHARGE MEDICATIONS: Current Discharge Medication List    START taking these medications   Details  docusate sodium (COLACE) 100 MG capsule Take 1 capsule (100 mg total) by mouth 2 (two) times daily. For constipation Qty: 60 capsule, Refills: 3    methocarbamol (ROBAXIN) 500 MG tablet Take 2 tablets (1,000 mg total) by mouth every 8 (eight) hours as needed for muscle spasms (or pain). Qty: 90 tablet, Refills: 1    metoCLOPramide (REGLAN) 10 MG tablet Take 1 tablet (10 mg total) by mouth 3 (three) times daily before meals. Qty: 90 tablet, Refills: 3    oxyCODONE-acetaminophen (PERCOCET/ROXICET) 5-325 MG tablet Take 1 tablet by mouth every 6 (six) hours as needed for moderate pain or severe pain. Qty: 30 tablet, Refills: 0    polyethylene glycol (MIRALAX / GLYCOLAX) packet Take 17 g by mouth daily as needed for mild constipation or moderate constipation. Qty: 30 each, Refills: 3    promethazine  (PHENERGAN) 12.5 MG tablet Take 1 tablet (12.5 mg total) by mouth every 6 (six) hours as needed for nausea or vomiting. Qty: 30 tablet, Refills: 0      CONTINUE these medications which have NOT CHANGED   Details  acetaminophen (TYLENOL) 500 MG tablet Take 500 mg by mouth daily as needed for mild pain, moderate pain, fever or headache. For pain    albuterol (PROVENTIL) (2.5 MG/3ML) 0.083% nebulizer solution Take 2.5 mg by nebulization 2 (two) times daily as needed for wheezing or shortness of breath.     Azelastine HCl (ASTEPRO) 0.15 % SOLN Place 2 sprays into the nose daily.     budesonide-formoterol (SYMBICORT) 160-4.5 MCG/ACT inhaler Inhale 2 puffs into the lungs daily.    loratadine (CLARITIN) 10 MG tablet Take 10 mg by mouth daily as needed. For allergy symptoms    losartan-hydrochlorothiazide (HYZAAR) 100-12.5 MG per tablet Take 1 tablet by mouth daily.    omeprazole (PRILOSEC) 40 MG capsule Take 40 mg by mouth daily.    Probiotic Product (PROBIOTIC PO) Take 1 capsule by mouth daily.         Brief H and P: For complete details please refer to admission H and P, but in brief 74 year old female with hypertension, GERD, chronic abdominal pain (sees Dr. Watt Climes), history of hysterectomy presented to the ED with several days of abdominal pain with nausea and vomiting of clear source and constipated for 2 days prior to admission. Patient was found to have minimal abdominal tenderness on exam with CT of  the abdomen showing small bowel obstruction with transition point in the mid lower pelvis.  The patient was admitted and surgery was consulted.  The patient was ultimately started on a clear liquid diet. Unfortunately, she continued to have vomiting. Abdominal x-ray on 04/13/2015 revealed persistent small bowel dilatation. As result, NG tube was placed to low intermittent suction. Although the patient initially improved with NG tube suction, her bowel dilatation worsened again once it was  clamped. Ultimately, she was taken to surgery on 04/18/15.   Hospital Course:   Small bowel obstruction -Secondary to adhesions from prior surgery. Failed conservative management and eneded having lysis of adhesions on 11/28. NG tube was discontinued on 12/2. Patient is now tolerating a regular diet without any difficulty. She will follow up with general surgery outpatient in 2 weeks.   Hypoglycemia resolved Currently tolerating regular diet without any difficulty.  Hypokalemia, hypomagnesemia Resolved.   Essential Hypertension -Stable.  COPD -stable on RA, continue Symbicort  Day of Discharge BP 148/82 mmHg  Pulse 83  Temp(Src) 98.2 F (36.8 C) (Oral)  Resp 18  Ht '5\' 5"'$  (1.651 m)  Wt 77.111 kg (170 lb)  BMI 28.29 kg/m2  SpO2 98%  Physical Exam: General: Alert and awake oriented x3 not in any acute distress. HEENT: anicteric sclera, pupils reactive to light and accommodation CVS: S1-S2 clear no murmur rubs or gallops Chest: clear to auscultation bilaterally, no wheezing rales or rhonchi Abdomen: soft nontender, nondistended, normal bowel sounds, incision CDI Extremities: no cyanosis, clubbing or edema noted bilaterally Neuro: Cranial nerves II-XII intact, no focal neurological deficits   The results of significant diagnostics from this hospitalization (including imaging, microbiology, ancillary and laboratory) are listed below for reference.    LAB RESULTS: Basic Metabolic Panel:  Recent Labs Lab 04/23/15 0444 04/24/15 0604  NA 139 139  K 3.6 3.7  CL 105 107  CO2 26 25  GLUCOSE 90 95  BUN <5* <5*  CREATININE 0.68 0.76  CALCIUM 8.4* 8.5*  MG 1.3* 1.7   Liver Function Tests:  Recent Labs Lab 04/24/15 0604  AST 30  ALT 55*  ALKPHOS 49  BILITOT 0.6  PROT 5.3*  ALBUMIN 2.6*   No results for input(s): LIPASE, AMYLASE in the last 168 hours. No results for input(s): AMMONIA in the last 168 hours. CBC:  Recent Labs Lab 04/20/15 0410 04/24/15 0604   WBC 9.1 10.2  HGB 12.3 12.2  HCT 36.3 36.8  MCV 89.6 88.7  PLT 211 246   Cardiac Enzymes: No results for input(s): CKTOTAL, CKMB, CKMBINDEX, TROPONINI in the last 168 hours. BNP: Invalid input(s): POCBNP CBG:  Recent Labs Lab 04/23/15 2034 04/24/15 0753  GLUCAP 117* 91    Significant Diagnostic Studies:  Ct Abdomen Pelvis W Contrast  04/10/2015  CLINICAL DATA:  Nausea and vomiting today. Lower abdominal pain. History of abdominal surgeries. EXAM: CT ABDOMEN AND PELVIS WITH CONTRAST TECHNIQUE: Multidetector CT imaging of the abdomen and pelvis was performed using the standard protocol following bolus administration of intravenous contrast. CONTRAST:  2m OMNIPAQUE IOHEXOL 300 MG/ML  SOLN COMPARISON:  CT 02/17/2014 FINDINGS: Lower chest:  The included lung bases are clear. Liver: No focal hepatic lesion. Hepatobiliary: Gallbladder physiologically distended. Small wall calcification is unchanged, no calcified gallstone. No biliary dilatation. Pancreas: No ductal dilatation or inflammation. Spleen: Normal. Adrenal glands: No nodule. Kidneys: Symmetric renal enhancement and excretion. No hydronephrosis. No perinephric stranding or localizing renal abnormality. Stomach/Bowel: The stomach is decompressed. Proximal small bowel loops are nondistended. There  is a moderate length segment of dilated fluid-filled small bowel loops in the lower abdomen/pelvis. Transition point seen in the midline lower pelvis image 60 (better appreciated on sagittal reformats image 90). There is a question of a more proximal transition point or posteriorly in the lower abdomen, image 52, however this is not definitive. Mild adjacent mesenteric edema and small volume of free fluid. No definite bowel wall thickening. No mesenteric swirling. No pneumatosis. Distal small bowel loops are decompressed. Small volume of stool throughout the colon without colonic wall thickening. Portions of the colon are not well defined given  bowel dilatation and lack of enteric contrast. Appendix is not visualized. Vascular/Lymphatic: No retroperitoneal adenopathy. Abdominal aorta is normal in caliber. Mild atherosclerosis without aneurysm. Reproductive: Uterus presumably absent. No evidence of adnexal mass. Bladder: Physiologically distended. Other: No free air free fluid, or intra-abdominal fluid collection. Scattered soft tissue granulomas. Musculoskeletal: There are no acute or suspicious osseous abnormalities. Multilevel degenerative change throughout the spine with degenerative disc disease and facet arthropathy. 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 IMPRESSION: Small bowel obstruction with transition point in the mid mid lower pelvis. A more proximal second point is questioned but not definitive, and closed loop obstruction is not excluded. However early bowel obstruction is favored. Electronically Signed   By: Jeb Levering M.D.   On: 04/10/2015 00:15    2D ECHO:   Disposition and Follow-up: Discharge Instructions    AMB Referral to [facility]    Complete by:  As directed   Reason for consult:  Post hospital follow up for transition by home.  Long hospital stay, [facility] patient.  Expected date of contact:  1-3 days (reserved for hospital discharges)  Please assign to community nurse for transition of care calls and assess for home visits. Questions please call:  Natividad Brood, RN BSN [facility] Liaison  [phone] business mobile phone     Diet - low sodium heart healthy    Complete by:  As directed      Increase activity slowly    Complete by:  As directed             DISPOSITION: Home   DISCHARGE FOLLOW-UP Follow-up Information    Follow up with [facility] In 1 week.   Specialty:  General Surgery   Why:  For wound re-check, staples out   Contact information:   [address] [place] [phone]       Follow up with Foye Spurling, MD. Schedule an appointment as soon as possible for a visit in 2 weeks.   Specialty:  Internal Medicine   Why:  for hospital follow-up   Contact information:   [address] [place] [address] [phone]        Time spent on Discharge: 35 minutes  Signed:   Pocahontas Cohenour M.D. [facility] 04/24/2015, 11:07 AM Pager: [phone]

## 2015-04-26 ENCOUNTER — Emergency Department (HOSPITAL_COMMUNITY)
Admission: EM | Admit: 2015-04-26 | Discharge: 2015-04-26 | Disposition: A | Discharge status: Home / Self Care | Payer: Commercial Managed Care - HMO | Attending: Emergency Medicine | Admitting: Emergency Medicine

## 2015-04-26 ENCOUNTER — Encounter (HOSPITAL_COMMUNITY): Payer: Self-pay | Admitting: Emergency Medicine

## 2015-04-26 ENCOUNTER — Emergency Department (HOSPITAL_COMMUNITY): Payer: Commercial Managed Care - HMO

## 2015-04-26 DIAGNOSIS — Z880 Allergy status to penicillin: Secondary | ICD-10-CM | POA: Diagnosis not present

## 2015-04-26 DIAGNOSIS — Z8619 Personal history of other infectious and parasitic diseases: Secondary | ICD-10-CM | POA: Insufficient documentation

## 2015-04-26 DIAGNOSIS — Z9889 Other specified postprocedural states: Secondary | ICD-10-CM | POA: Diagnosis not present

## 2015-04-26 DIAGNOSIS — Z7951 Long term (current) use of inhaled steroids: Secondary | ICD-10-CM | POA: Insufficient documentation

## 2015-04-26 DIAGNOSIS — I10 Essential (primary) hypertension: Secondary | ICD-10-CM | POA: Diagnosis not present

## 2015-04-26 DIAGNOSIS — E119 Type 2 diabetes mellitus without complications: Secondary | ICD-10-CM | POA: Insufficient documentation

## 2015-04-26 DIAGNOSIS — Z79899 Other long term (current) drug therapy: Secondary | ICD-10-CM | POA: Insufficient documentation

## 2015-04-26 DIAGNOSIS — J45909 Unspecified asthma, uncomplicated: Secondary | ICD-10-CM | POA: Diagnosis not present

## 2015-04-26 DIAGNOSIS — Z87891 Personal history of nicotine dependence: Secondary | ICD-10-CM | POA: Insufficient documentation

## 2015-04-26 DIAGNOSIS — R109 Unspecified abdominal pain: Secondary | ICD-10-CM | POA: Diagnosis present

## 2015-04-26 DIAGNOSIS — K219 Gastro-esophageal reflux disease without esophagitis: Secondary | ICD-10-CM | POA: Insufficient documentation

## 2015-04-26 DIAGNOSIS — K5660 Unspecified intestinal obstruction: Secondary | ICD-10-CM | POA: Diagnosis not present

## 2015-04-26 DIAGNOSIS — R1031 Right lower quadrant pain: Secondary | ICD-10-CM | POA: Insufficient documentation

## 2015-04-26 HISTORY — DX: Unspecified intestinal obstruction, unspecified as to partial versus complete obstruction: K56.609

## 2015-04-26 LAB — CBC
HCT: 37.8 % (ref 36.0–46.0)
Hemoglobin: 13 g/dL (ref 12.0–15.0)
MCH: 30.3 pg (ref 26.0–34.0)
MCHC: 34.4 g/dL (ref 30.0–36.0)
MCV: 88.1 fL (ref 78.0–100.0)
Platelets: 266 10*3/uL (ref 150–400)
RBC: 4.29 MIL/uL (ref 3.87–5.11)
RDW: 14.2 % (ref 11.5–15.5)
WBC: 9.8 10*3/uL (ref 4.0–10.5)

## 2015-04-26 LAB — COMPREHENSIVE METABOLIC PANEL
ALT: 49 U/L (ref 14–54)
AST: 38 U/L (ref 15–41)
Albumin: 3.2 g/dL — ABNORMAL LOW (ref 3.5–5.0)
Alkaline Phosphatase: 57 U/L (ref 38–126)
Anion gap: 10 (ref 5–15)
BUN: <5 mg/dL — ABNORMAL LOW (ref 6–20)
CO2: 27 mmol/L (ref 22–32)
Calcium: 9.5 mg/dL (ref 8.9–10.3)
Chloride: 102 mmol/L (ref 101–111)
Creatinine, Ser: 0.78 mg/dL (ref 0.44–1.00)
GFR calc Af Amer: >60 mL/min (ref >60)
GFR calc non Af Amer: >60 mL/min (ref >60)
Glucose, Bld: 85 mg/dL (ref 65–99)
Potassium: 3.7 mmol/L (ref 3.5–5.1)
Sodium: 139 mmol/L (ref 135–145)
Total Bilirubin: 0.9 mg/dL (ref 0.3–1.2)
Total Protein: 6.4 g/dL — ABNORMAL LOW (ref 6.5–8.1)

## 2015-04-26 LAB — URINALYSIS, ROUTINE W REFLEX MICROSCOPIC
Bilirubin Urine: NEGATIVE
Glucose, UA: NEGATIVE mg/dL
Hgb urine dipstick: NEGATIVE
Ketones, ur: 15 mg/dL — AB
Leukocytes, UA: NEGATIVE
Nitrite: NEGATIVE
Protein, ur: NEGATIVE mg/dL
Specific Gravity, Urine: 1.006 (ref 1.005–1.030)
pH: 6.5 (ref 5.0–8.0)

## 2015-04-26 LAB — LIPASE, BLOOD: Lipase: 33 U/L (ref 11–51)

## 2015-04-26 IMAGING — CT CT ABD-PELV W/ CM
2 of 5 series · 11 of 46 positions shown, 12 images · IV contrast (omnipaque)
Comparison: None.

Two-view abdomen [DATE]. CT of the abdomen and pelvis
[DATE].

CLINICAL DATA: Small bowel obstruction. Recent surgery [DATE].
Right-sided abdominal pain.

EXAM:
CT ABDOMEN AND PELVIS WITH CONTRAST
TECHNIQUE: Multidetector CT imaging of the abdomen and pelvis was performed
using the standard protocol following bolus administration of
intravenous contrast.
CONTRAST:  80mL OMNIPAQUE IOHEXOL 300 MG/ML  SOLN

[Series 201: routine, idose (2) · axial · 0.71mm/px · z∈[+247,+612]mm · 8 of 93 slices shown, 9 images]
[im 10/93  soft-tissue]
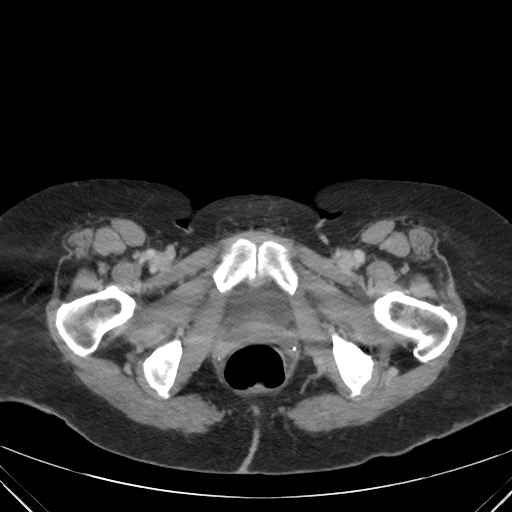
[im 10/93  bone]
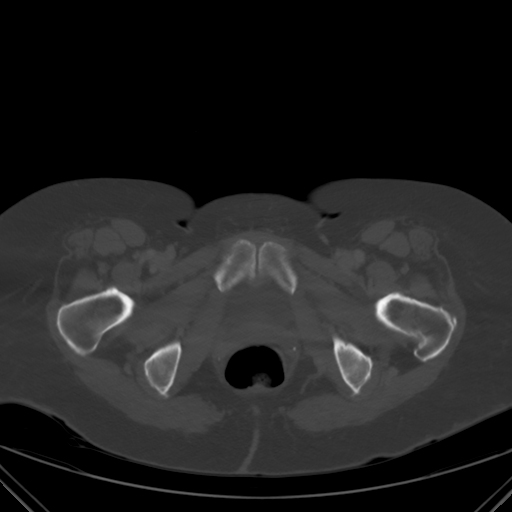
[im 19/93  soft-tissue]
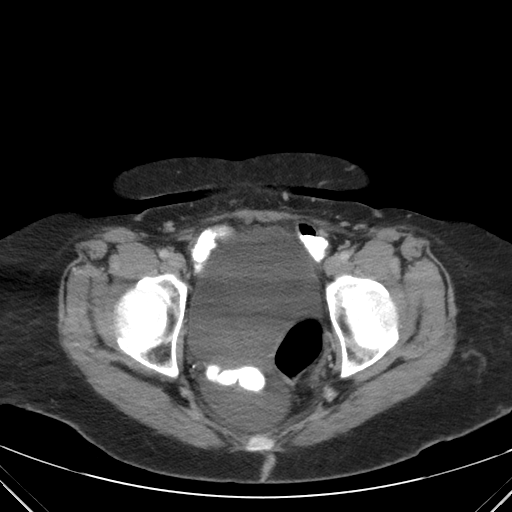
[im 28/93  soft-tissue]
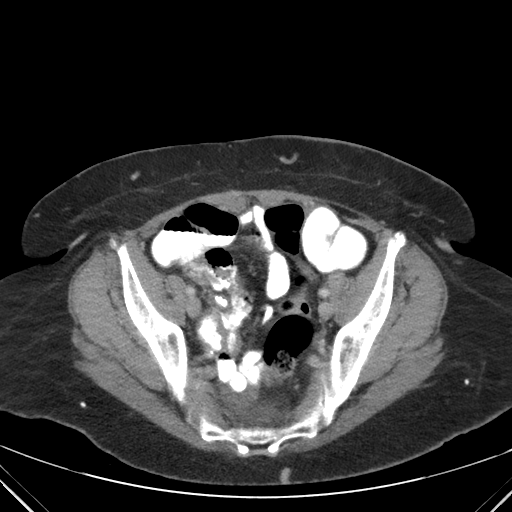
[im 42/93  soft-tissue]
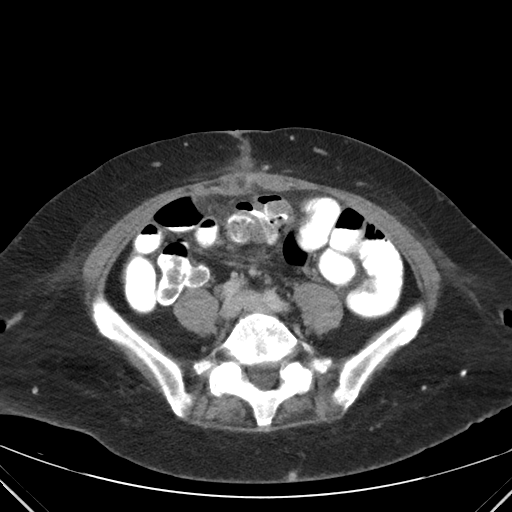
[im 51/93  soft-tissue]
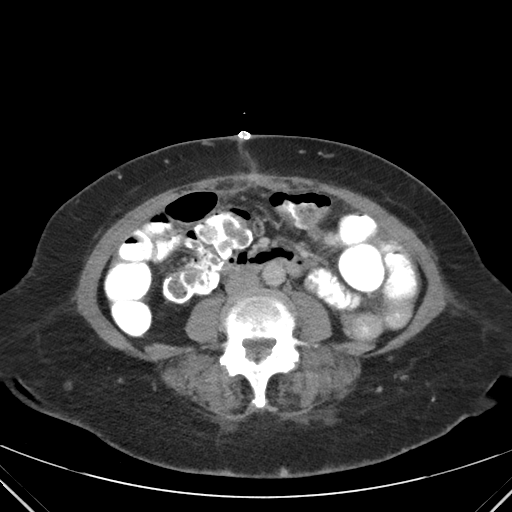
[im 65/93  soft-tissue]
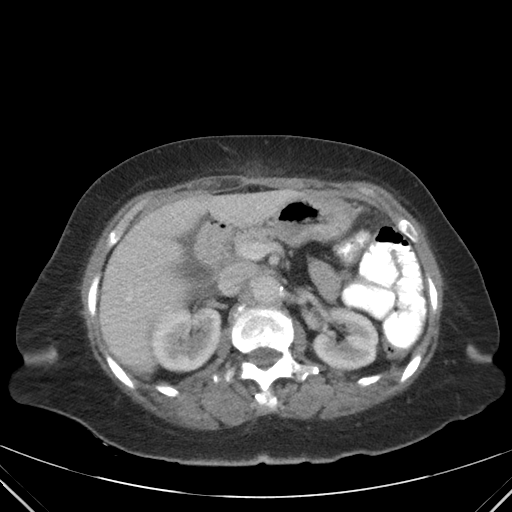
[im 74/93  soft-tissue]
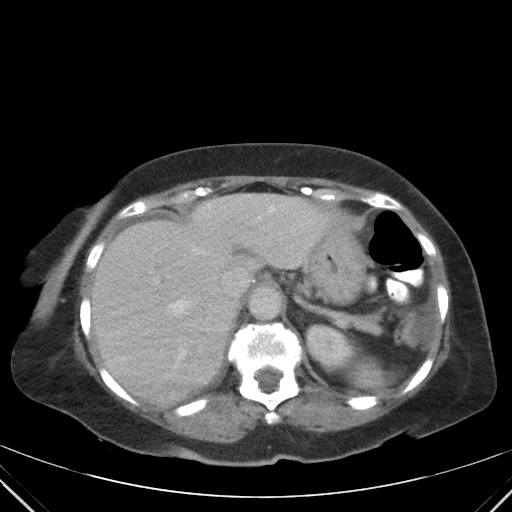
[im 83/93  soft-tissue]
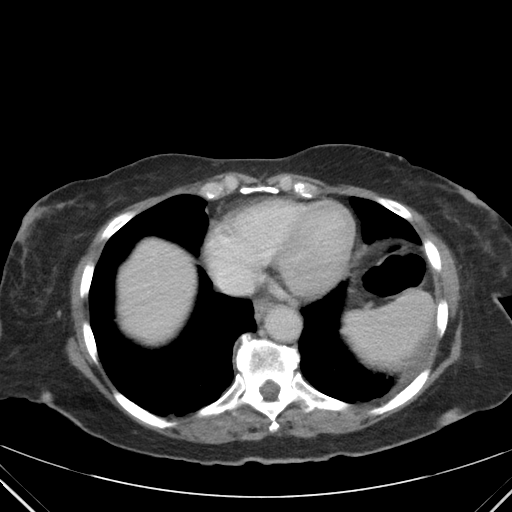

[Series 203: coronals, idose (2) · coronal · 0.45mm/px · 3 of 104 slices shown]
[im 35/104  soft-tissue]
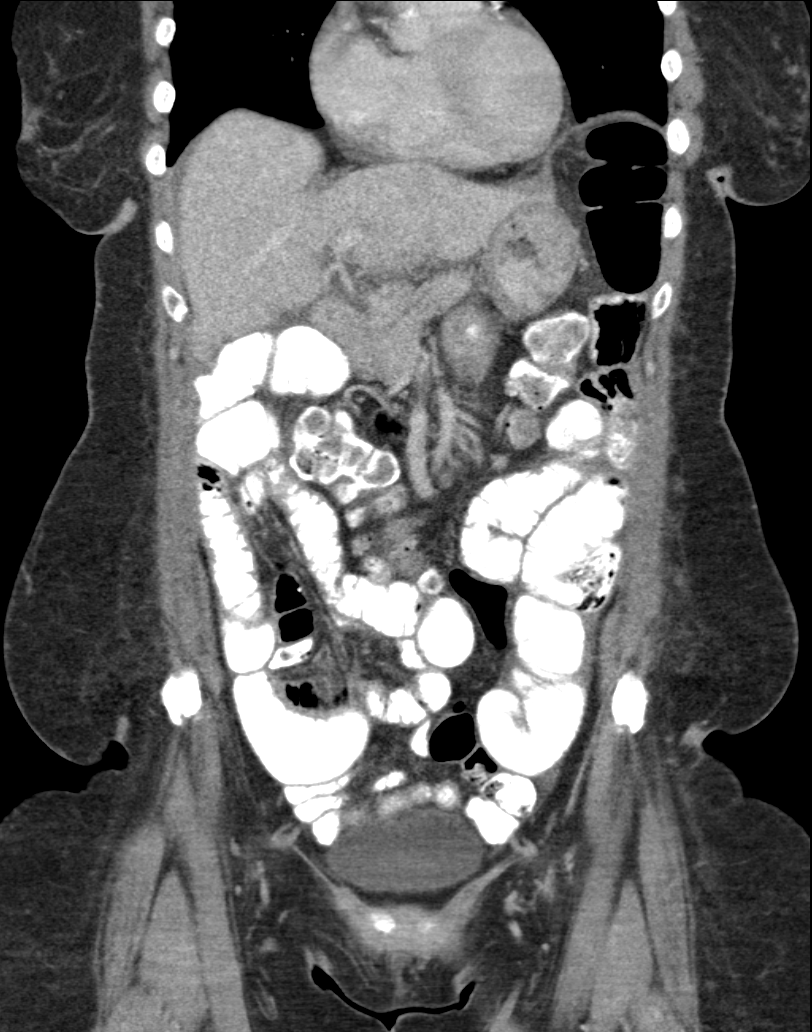
[im 46/104  soft-tissue]
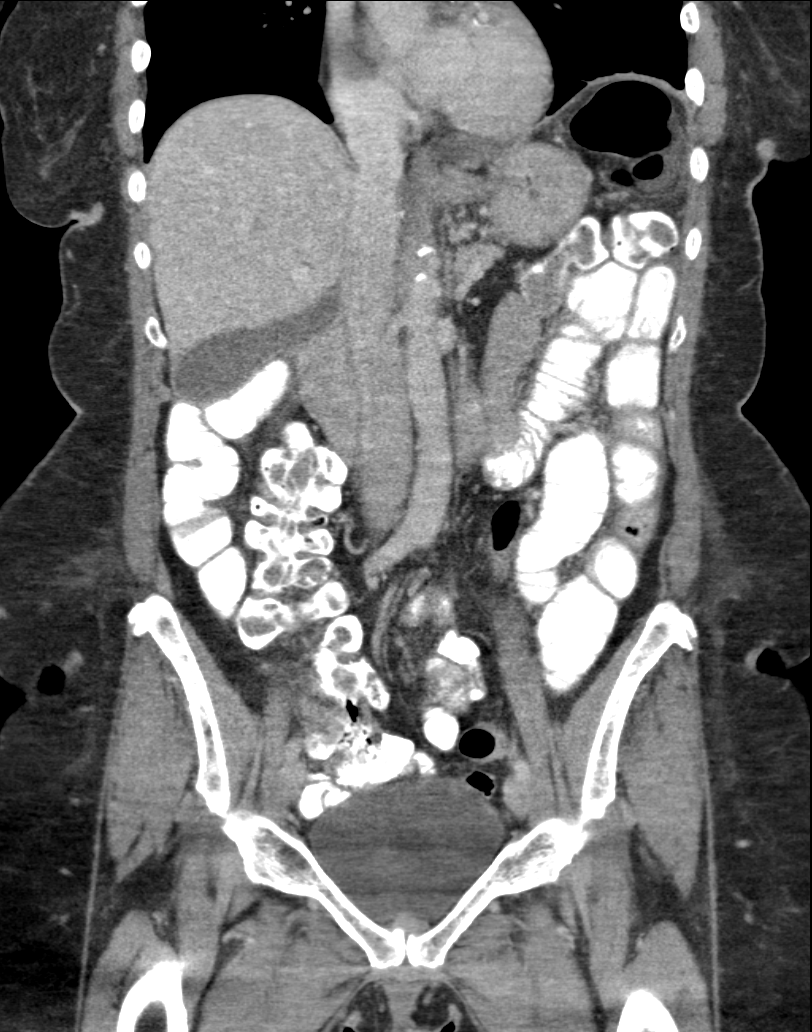
[im 58/104  soft-tissue]
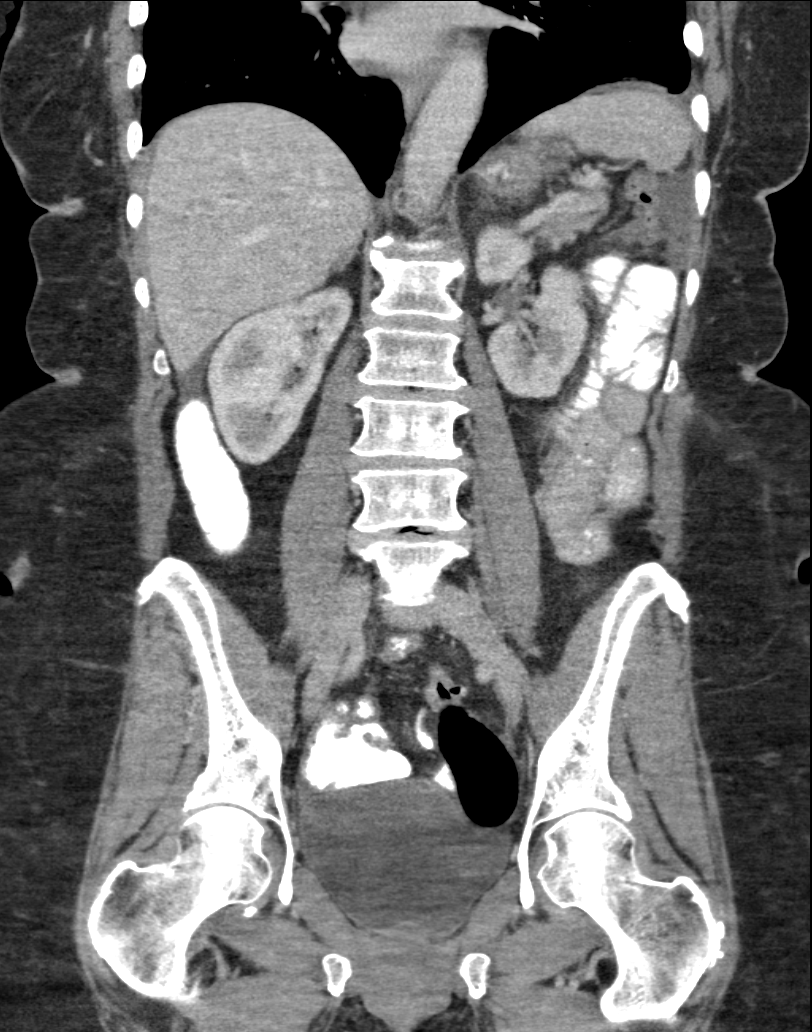

[11 of 46 positions shown; findings below may reference images not displayed]

FINDINGS: Lower chest: Mild dependent atelectasis is present bilaterally.
Atherosclerotic calcifications are present in the coronary arteries.
A small left pleural effusion is present. Mild bibasilar atelectasis
is noted.

Hepatobiliary: The liver is within normal limits. The common bile
duct and gallbladder is normal.

Pancreas: Within normal limits

Spleen: The spleen is within normal limits. There is some fluid
about the spleen.

Adrenals/Urinary Tract: The adrenal glands are normal. Kidneys and
ureters are within normal limits bilaterally. Urinary bladder is
unremarkable.

Stomach/Bowel: The stomach and duodenum are decompressed. Mildly
dilated loops of small bowel contain contrast. Contrast can be seen
to the level of the distal transverse colon. The distal colon is
collapsed. No focal inflammation is present. There is no focal
obstruction. The appendix is not discretely visualized and may be
surgically absent.

Vascular/Lymphatic: Mild atherosclerotic calcifications are present
in the aorta without aneurysm. No significant adenopathy is present.

Reproductive: Hysterectomy is noted. The ovaries are not visualized
and may be surgically absent as well.

Other: A midline laparotomy scar is noted. There is no residual
intraperitoneal gas. There is fluid layering in the dependent spaces
including the left pericolic gutter, about the liver and spleen, and
within the anatomic pelvis..

Musculoskeletal: Multilevel degenerative changes are present in the
lumbar spine. Grade 1 degenerative anterolisthesis and a vacuum disc
is present at L4-5. A vacuum disc is also present at L5-S1.
IMPRESSION: 1. Mild dilation of small bowel with oral contrast evident to the
level of the distal transverse colon. There is no residual small
bowel obstruction. An ileus pattern is present.
2. Small amounts of the free fluid layering within the peritoneal
space. There is no focal inflammation to suggest infection or
developing abscess.
3. Mild atherosclerotic changes.
4. Degenerative changes of the lower lumbar spine.

## 2015-04-26 MED ORDER — MORPHINE SULFATE (PF) 4 MG/ML IV SOLN
4.0000 mg | Freq: Once | INTRAVENOUS | Status: AC
  Administered 2015-04-26: 4 mg via INTRAVENOUS
  Filled 2015-04-26: qty 1

## 2015-04-26 MED ORDER — IOHEXOL 300 MG/ML  SOLN
80.0000 mL | Freq: Once | INTRAMUSCULAR | Status: AC | PRN
  Administered 2015-04-26: 80 mL via INTRAVENOUS

## 2015-04-26 MED ORDER — IOHEXOL 300 MG/ML  SOLN
25.0000 mL | Freq: Once | INTRAMUSCULAR | Status: AC | PRN
  Administered 2015-04-26: 25 mL via ORAL

## 2015-04-26 MED ORDER — ONDANSETRON HCL 4 MG/2ML IJ SOLN
4.0000 mg | Freq: Once | INTRAMUSCULAR | Status: AC
  Administered 2015-04-26: 4 mg via INTRAVENOUS
  Filled 2015-04-26: qty 2

## 2015-04-26 NOTE — ED Notes (Signed)
Pt. reports right abdominal pain onset yesterday with dizziness , denies nausea or vomitting , no fever or diarrhea , recent bowel surgery ( bowel obstruction ) last 04/18/2015 by Dr. Rosendo Gros .

## 2015-04-26 NOTE — ED Notes (Signed)
Patient placed back on continuous pulse oximetry and blood pressure cuff; visitor at bedside

## 2015-04-26 NOTE — Discharge Instructions (Signed)

## 2015-04-26 NOTE — ED Provider Notes (Signed)
CSN: [id number]     Arrival date & time 04/26/15  0551 History   First MD Initiated Contact with Patient 04/26/15 [phone]     Chief Complaint  Patient presents with  . Abdominal Pain     (Consider location/radiation/quality/duration/timing/severity/associated sxs/prior Treatment) HPI Comments: Patient presents to the ED with a chief complaint of abdominal pain.  She is s/p 8 days SBO, surgery performed by [facility] (Dr. Rosendo Gros).  She was discharged from the hospital on 12/4.  She reports that since she has go home she has had worsening pain.  She reports not having had a BM.  Denies any associated n/v/d.  Denies any fevers or chills.  Her symptoms are aggravated with palpation.  The history is provided by the patient. No language interpreter was used.    Past Medical History  Diagnosis Date  . Hypertension   . Asthma   . Environmental allergies   . GERD (gastroesophageal reflux disease)   . H. pylori infection   . Bowel obstruction [facility])    Past Surgical History  Procedure Laterality Date  . Knee surgery    . Nasal sinus surgery    . Thyroid surgery    . Laparotomy N/A 04/18/2015    Procedure: Exploratory laparotomy with lysis of adhesions, possible bowel resection;  Surgeon: Ralene Ok, MD;  Location: [place];  Service: General;  Laterality: N/A;   No family history on file. Social History  Substance Use Topics  . Smoking status: Former Research scientist (life sciences)  . Smokeless tobacco: None  . Alcohol Use: No   OB History    No data available     Review of Systems  Constitutional: Negative for fever and chills.  Respiratory: Negative for shortness of breath.   Cardiovascular: Negative for chest pain.  Gastrointestinal: Positive for abdominal pain. Negative for nausea, vomiting, diarrhea and constipation.  Genitourinary: Negative for dysuria.  All other systems reviewed and are negative.     Allergies  Asa buff (mag; Fish allergy; Other; Peanut-containing drug products;  Penicillins; Shellfish-derived products; and Strawberry extract  Home Medications   Prior to Admission medications   Medication Sig Start Date End Date Taking? Authorizing Provider  acetaminophen (TYLENOL) 500 MG tablet Take 500 mg by mouth daily as needed for mild pain, moderate pain, fever or headache. For pain    Historical Provider, MD  albuterol (PROVENTIL) (2.5 MG/3ML) 0.083% nebulizer solution Take 2.5 mg by nebulization 2 (two) times daily as needed for wheezing or shortness of breath.     Historical Provider, MD  Azelastine HCl (ASTEPRO) 0.15 % SOLN Place 2 sprays into the nose daily.     Historical Provider, MD  budesonide-formoterol (SYMBICORT) 160-4.5 MCG/ACT inhaler Inhale 2 puffs into the lungs daily.    Historical Provider, MD  docusate sodium (COLACE) 100 MG capsule Take 1 capsule (100 mg total) by mouth 2 (two) times daily. For constipation 04/24/15   Ripudeep Krystal Eaton, MD  loratadine (CLARITIN) 10 MG tablet Take 10 mg by mouth daily as needed. For allergy symptoms    Historical Provider, MD  losartan-hydrochlorothiazide (HYZAAR) 100-12.5 MG per tablet Take 1 tablet by mouth daily.    Historical Provider, MD  methocarbamol (ROBAXIN) 500 MG tablet Take 2 tablets (1,000 mg total) by mouth every 8 (eight) hours as needed for muscle spasms (or pain). 04/24/15   Ripudeep Krystal Eaton, MD  metoCLOPramide (REGLAN) 10 MG tablet Take 1 tablet (10 mg total) by mouth 3 (three) times daily before meals. 04/24/15  Ripudeep Krystal Eaton, MD  omeprazole (PRILOSEC) 40 MG capsule Take 40 mg by mouth daily.    Historical Provider, MD  oxyCODONE-acetaminophen (PERCOCET/ROXICET) 5-325 MG tablet Take 1 tablet by mouth every 6 (six) hours as needed for moderate pain or severe pain. 04/24/15   Ripudeep Krystal Eaton, MD  polyethylene glycol (MIRALAX / GLYCOLAX) packet Take 17 g by mouth daily as needed for mild constipation or moderate constipation. 04/24/15   Ripudeep Krystal Eaton, MD  Probiotic Product (PROBIOTIC PO) Take 1 capsule by  mouth daily.    Historical Provider, MD  promethazine (PHENERGAN) 12.5 MG tablet Take 1 tablet (12.5 mg total) by mouth every 6 (six) hours as needed for nausea or vomiting. 04/24/15   Ripudeep K Rai, MD   BP 111/66 mmHg  Pulse 71  Temp(Src) 97.5 F (36.4 C) (Oral)  Resp 16  SpO2 100% Physical Exam  Constitutional: She is oriented to person, place, and time. She appears well-developed and well-nourished.  HENT:  Head: Normocephalic and atraumatic.  Eyes: Conjunctivae and EOM are normal. Pupils are equal, round, and reactive to light.  Neck: Normal range of motion. Neck supple.  Cardiovascular: Normal rate and regular rhythm.  Exam reveals no gallop and no friction rub.   No murmur heard. Pulmonary/Chest: Effort normal and breath sounds normal. No respiratory distress. She has no wheezes. She has no rales. She exhibits no tenderness.  Abdominal: Soft. Bowel sounds are normal. She exhibits no distension and no mass. There is tenderness. There is no rebound and no guarding.  Midline incision, well-healing, no discharge or drainage, no induration, well-appearing, mildly tender to palpation over midline, moderate RLQ tenderness  Musculoskeletal: Normal range of motion. She exhibits no edema or tenderness.  Neurological: She is alert and oriented to person, place, and time.  Skin: Skin is warm and dry.  Psychiatric: She has a normal mood and affect. Her behavior is normal. Judgment and thought content normal.  Nursing note and vitals reviewed.   ED Course  Procedures (including critical care time) Results for orders placed or performed during the hospital encounter of 04/26/15  Lipase, blood  Result Value Ref Range   Lipase 33 11 - 51 U/L  Comprehensive metabolic panel  Result Value Ref Range   Sodium 139 135 - 145 mmol/L   Potassium 3.7 3.5 - 5.1 mmol/L   Chloride 102 101 - 111 mmol/L   CO2 27 22 - 32 mmol/L   Glucose, Bld 85 65 - 99 mg/dL   BUN <5 (L) 6 - 20 mg/dL   Creatinine,  Ser 0.78 0.44 - 1.00 mg/dL   Calcium 9.5 8.9 - 10.3 mg/dL   Total Protein 6.4 (L) 6.5 - 8.1 g/dL   Albumin 3.2 (L) 3.5 - 5.0 g/dL   AST 38 15 - 41 U/L   ALT 49 14 - 54 U/L   Alkaline Phosphatase 57 38 - 126 U/L   Total Bilirubin 0.9 0.3 - 1.2 mg/dL   GFR calc non Af Amer >60 >60 mL/min   GFR calc Af Amer >60 >60 mL/min   Anion gap 10 5 - 15  CBC  Result Value Ref Range   WBC 9.8 4.0 - 10.5 K/uL   RBC 4.29 3.87 - 5.11 MIL/uL   Hemoglobin 13.0 12.0 - 15.0 g/dL   HCT 37.8 36.0 - 46.0 %   MCV 88.1 78.0 - 100.0 fL   MCH 30.3 26.0 - 34.0 pg   MCHC 34.4 30.0 - 36.0 g/dL   RDW  14.2 11.5 - 15.5 %   Platelets 266 150 - 400 K/uL  Urinalysis, Routine w reflex microscopic (not at [facility])  Result Value Ref Range   Color, Urine YELLOW YELLOW   APPearance CLEAR CLEAR   Specific Gravity, Urine 1.006 1.005 - 1.030   pH 6.5 5.0 - 8.0   Glucose, UA NEGATIVE NEGATIVE mg/dL   Hgb urine dipstick NEGATIVE NEGATIVE   Bilirubin Urine NEGATIVE NEGATIVE   Ketones, ur 15 (A) NEGATIVE mg/dL   Protein, ur NEGATIVE NEGATIVE mg/dL   Nitrite NEGATIVE NEGATIVE   Leukocytes, UA NEGATIVE NEGATIVE   Ct Abdomen Pelvis W Contrast  04/26/2015  CLINICAL DATA:  Small bowel obstruction. Recent surgery 04/18/2015. Right-sided abdominal pain. EXAM: CT ABDOMEN AND PELVIS WITH CONTRAST TECHNIQUE: Multidetector CT imaging of the abdomen and pelvis was performed using the standard protocol following bolus administration of intravenous contrast. CONTRAST:  58m OMNIPAQUE IOHEXOL 300 MG/ML  SOLN COMPARISON:  None. Two-view abdomen 04/18/2015. CT of the abdomen and pelvis 04/09/2015. FINDINGS: Lower chest: Mild dependent atelectasis is present bilaterally. Atherosclerotic calcifications are present in the coronary arteries. A small left pleural effusion is present. Mild bibasilar atelectasis is noted. Hepatobiliary: The liver is within normal limits. The common bile duct and gallbladder is normal. Pancreas: Within normal limits  Spleen: The spleen is within normal limits. There is some fluid about the spleen. Adrenals/Urinary Tract: The adrenal glands are normal. Kidneys and ureters are within normal limits bilaterally. Urinary bladder is unremarkable. Stomach/Bowel: The stomach and duodenum are decompressed. Mildly dilated loops of small bowel contain contrast. Contrast can be seen to the level of the distal transverse colon. The distal colon is collapsed. No focal inflammation is present. There is no focal obstruction. The appendix is not discretely visualized and may be surgically absent. Vascular/Lymphatic: Mild atherosclerotic calcifications are present in the aorta without aneurysm. No significant adenopathy is present. Reproductive: Hysterectomy is noted. The ovaries are not visualized and may be surgically absent as well. Other: A midline laparotomy scar is noted. There is no residual intraperitoneal gas. There is fluid layering in the dependent spaces including the left pericolic gutter, about the liver and spleen, and within the anatomic pelvis. Musculoskeletal: Multilevel degenerative changes are present in the lumbar spine. Grade 1 degenerative anterolisthesis and a vacuum disc is present at L4-5. A vacuum disc is also present at L5-S1. IMPRESSION: 1. Mild dilation of small bowel with oral contrast evident to the level of the distal transverse colon. There is no residual small bowel obstruction. An ileus pattern is present. 2. Small amounts of the free fluid layering within the peritoneal space. There is no focal inflammation to suggest infection or developing abscess. 3. Mild atherosclerotic changes. 4. Degenerative changes of the lower lumbar spine. Electronically Signed   By: CSan MorelleM.D.   On: 04/26/2015 11:12   Ct Abdomen Pelvis W Contrast  04/10/2015  CLINICAL DATA:  Nausea and vomiting today. Lower abdominal pain. History of abdominal surgeries. EXAM: CT ABDOMEN AND PELVIS WITH CONTRAST TECHNIQUE:  Multidetector CT imaging of the abdomen and pelvis was performed using the standard protocol following bolus administration of intravenous contrast. CONTRAST:  74mOMNIPAQUE IOHEXOL 300 MG/ML  SOLN COMPARISON:  CT 02/17/2014 FINDINGS: Lower chest:  The included lung bases are clear. Liver: No focal hepatic lesion. Hepatobiliary: Gallbladder physiologically distended. Small wall calcification is unchanged, no calcified gallstone. No biliary dilatation. Pancreas: No ductal dilatation or inflammation. Spleen: Normal. Adrenal glands: No nodule. Kidneys: Symmetric renal enhancement and excretion. No  hydronephrosis. No perinephric stranding or localizing renal abnormality. Stomach/Bowel: The stomach is decompressed. Proximal small bowel loops are nondistended. There is a moderate length segment of dilated fluid-filled small bowel loops in the lower abdomen/pelvis. Transition point seen in the midline lower pelvis image 60 (better appreciated on sagittal reformats image 90). There is a question of a more proximal transition point or posteriorly in the lower abdomen, image 52, however this is not definitive. Mild adjacent mesenteric edema and small volume of free fluid. No definite bowel wall thickening. No mesenteric swirling. No pneumatosis. Distal small bowel loops are decompressed. Small volume of stool throughout the colon without colonic wall thickening. Portions of the colon are not well defined given bowel dilatation and lack of enteric contrast. Appendix is not visualized. Vascular/Lymphatic: No retroperitoneal adenopathy. Abdominal aorta is normal in caliber. Mild atherosclerosis without aneurysm. Reproductive: Uterus presumably absent. No evidence of adnexal mass. Bladder: Physiologically distended. Other: No free air free fluid, or intra-abdominal fluid collection. Scattered soft tissue granulomas. Musculoskeletal: There are no acute or suspicious osseous abnormalities. Multilevel degenerative change throughout  the spine with degenerative disc disease and facet arthropathy. 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 IMPRESSION: Small bowel obstruction with transition point in the mid mid lower pelvis. A more proximal second point is questioned but not definitive, and closed loop obstruction is not excluded. However early bowel obstruction is favored. Electronically Signed   By: Jeb Levering M.D.   On: 04/10/2015 00:15   Dg Abd 2 Views  04/18/2015  CLINICAL DATA:  Small bowel obstruction. EXAM: ABDOMEN - 2 VIEW COMPARISON:  Two-view abdomen 04/17/2015. FINDINGS: The side port of the NG tube is at or just above the GE junction. Dilated loops of small bowel have increased since the prior exam. Maximal dilation measures 4.5 cm on the supine image. Fluid levels are present on the upright images without free air. The lung bases are clear. Degenerative changes of the thoracolumbar spine are again noted. IMPRESSION: 1. Progressive dilation of small bowel compatible with worsening small bowel obstruction. 2. The side port of the NG tube is at or just above the GE junction. Electronically Signed   By: San Morelle M.D.   On: 04/18/2015 07:13   Dg Abd 2 Views  04/17/2015  CLINICAL DATA:  Small bowel obstruction. EXAM: ABDOMEN - 2 VIEW COMPARISON:  04/16/2015 and 04/14/2015 FINDINGS: Nasogastric tube unchanged with tip and side-port over the stomach in the left upper quadrant likely in the gastric fundus. Bowel gas pattern is improved as air is present throughout the colon. Single air-filled minimally dilated small bowel loop in the left mid abdomen improved. No evidence of free peritoneal air. Remainder of the exam is unchanged. IMPRESSION: Continued improvement in bowel gas pattern with only single mildly dilated air-filled loop of small bowel in the left mid abdomen. Air throughout the colon. Nasogastric tube unchanged with tip and side-port over the gastric fundus. Electronically Signed   By: Marin Olp  M.D.   On: 04/17/2015 09:38   Dg Abd 2 Views  04/16/2015  CLINICAL DATA:  Enteric tube placement.  Small bowel obstruction. EXAM: ABDOMEN - 2 VIEW COMPARISON:  04/14/2015 abdominal radiograph FINDINGS: Enteric tube loops in the proximal stomach and terminates in the proximal stomach, likely in the gastric fundus. There a few mildly dilated small bowel loops in the central and right lower abdomen, not appreciably changed. Mild colonic stool. No evidence of pneumatosis or pneumoperitoneum. There is mild retained oral contrast in the colon. IMPRESSION:  1. Enteric tube loops in and terminates in the proximal stomach, likely in the gastric fundus. 2. Mildly dilated central and right lower abdominal small bowel loops, not appreciably changed, suggesting ongoing mild partial small-bowel obstruction. Electronically Signed   By: Ilona Sorrel M.D.   On: 04/16/2015 09:01   Dg Abd 2 Views  04/14/2015  CLINICAL DATA:  Small bowel obstruction with emesis and pain EXAM: ABDOMEN - 2 VIEW COMPARISON:  April 13, 2015 FINDINGS: Supine and upright images obtained. There is dilated small bowel in the left mid abdomen. There are multiple air-fluid levels. A small amount of contrast is seen in the colon. No free air is apparent. There are phleboliths in the pelvis. There is moderate air in the stomach. IMPRESSION: The bowel gas pattern is consistent with a degree of obstruction. No free air apparent. Electronically Signed   By: Lowella Grip III M.D.   On: 04/14/2015 15:15   Dg Abd 2 Views  04/13/2015  CLINICAL DATA:  Abdominal pain with nausea and vomiting EXAM: ABDOMEN - 2 VIEW COMPARISON:  04/12/2015 FINDINGS: Stable small bowel dilatation is noted with air-fluid levels. Contrast material is again noted within the colon. No free air is seen. No abnormal mass is noted. Degenerative changes of the lumbar spine are seen. IMPRESSION: Persistent small bowel dilatation stable from the prior exam. Electronically Signed    By: Inez Catalina M.D.   On: 04/13/2015 13:33   Dg Abd Portable 1v  04/14/2015  CLINICAL DATA:  Patient status post NG tube placement. EXAM: PORTABLE ABDOMEN - 1 VIEW COMPARISON:  Abdominal radiograph 04/14/2015. FINDINGS: Enteric tube tip and side-port project over the stomach. Gas is demonstrated within the colon. Oral contrast material within the colon. Lung bases are clear. Lower thoracic and lumbar spine degenerative changes. IMPRESSION: NG tube tip and side-port project over the stomach. Electronically Signed   By: Lovey Newcomer M.D.   On: 04/14/2015 16:48   Dg Abd Portable 1v  04/12/2015  CLINICAL DATA:  Abdominal pain, follow up small bowel obstruction EXAM: PORTABLE ABDOMEN - 1 VIEW COMPARISON:  04/11/2015 FINDINGS: Persistent small bowel dilatation is noted. Contrast material is noted throughout the colon from previous administration. No new focal abnormality is seen. No free air is noted. IMPRESSION: Stable small bowel dilatation. Electronically Signed   By: Inez Catalina M.D.   On: 04/12/2015 09:43   Dg Abd Portable 1v-small Bowel Obstruction Protocol-24 Hr Delay  04/11/2015  CLINICAL DATA:  Small-bowel follow-through, 24 hour delay film EXAM: PORTABLE ABDOMEN - 1 VIEW COMPARISON:  04/09/2015 FINDINGS: Scattered large and small bowel gas is noted. Persistent small bowel dilatation is seen similar to that noted on the recent CT examination. Contrast material is noted throughout the colon. These changes would suggest a partial small bowel obstruction. No free air is seen. No other focal abnormality is noted. IMPRESSION: 24 hour delayed film shows contrast material within the colon indicating a partial small bowel obstruction. Persistent small bowel dilatation is noted. Electronically Signed   By: Inez Catalina M.D.   On: 04/11/2015 07:10    I have personally reviewed and evaluated these images and lab results as part of my medical decision-making.   EKG Interpretation None      MDM    Final diagnoses:  Abdominal pain, unspecified abdominal location    Patient with recent SBO, increased abdominal pain since discharge.  Will CT and check labs.  CT is reassuring, no further blockage.  Possible ileus.  Patient and  results discussed with Dr. Johnney Killian.  She is very well appearing and not in any distress.  She has follow-up on Friday with [facility].  Will discharge to home at this time.  Recommend soft foods.   Montine Circle, PA-C 04/26/15 1514  Charlesetta Shanks, MD 05/02/15 [phone]

## 2015-04-26 NOTE — ED Notes (Signed)
Pt verbalized understanding of d/c instructionsand follow-up care. No further questions/concerns, VSS, assisted to lobby in wheelchair. 

## 2015-04-26 NOTE — ED Notes (Signed)
Pt ambulatory w/ steady gait tor restroom.

## 2015-04-26 NOTE — ED Notes (Signed)
Patient up ambulatory at this time; visitor at bedside

## 2015-04-28 ENCOUNTER — Other Ambulatory Visit: Payer: Self-pay

## 2015-04-28 NOTE — Patient Outreach (Signed)
Transition of care: Called patient and number not valid. Called patient on cell phone. No answer. Left a message. Called daughter:  I explained purpose of call and daughter reports that she spoke with [employer] earlier today and reviewed medications. Daughter unable to complete transition of care call and request I call back on 12/8  Around 9am.  Plan: Will reach out to patient and daughter again on 04/28/2015 Tomasa Rand, RN, BSN, [place] Coordinator [phone]

## 2015-04-28 NOTE — Patient Outreach (Signed)
Transition of care attempt 3: Placed call to patient who reports that she is doing well. Reports no nausea, vomiting or diarrhea. Reports pain is decreased since she is eating more soft food and taking her pain medications with food. Patient reports that she is going to see surgeon tomorrow to have staples removed.  Reports bowels are moving well.  Patient ask that I speak with daughter Ronita Hipps ( who is on consent ) about her medications. Medication review completed.   Plan: Explained [facility] program and transition of care program. Offered home visit for December 15th  And patient accepted. Not active with home health.  Provided 24 hour nurse line number and my contact number. Will send this note to primary MD.  [facility] CM Care Plan Problem One        Most Recent Value   Care Plan Problem One  Recent hospital admission related to small bowel obstruction   Role Documenting the Problem One  Care Management [place] for Problem One  Active   [facility] Long Term Goal (31-90 days)  Patient will be able to verbalize no readmission in the next 31 days   [facility] Long Term Goal Start Date  04/28/15   Interventions for Problem One Long Term Goal  Reviewed discharge instructions with patient and daughter. reviewed medications, reviewed importance of follow up with MD. Scheduled home visit   [facility] CM Short Term Goal #1 (0-30 days)  Patient will report attending surgical follow up within 5 days.   [facility] CM Short Term Goal #1 Start Date  04/28/15   Interventions for Short Term Goal #1  Confirmed appointment with daughter and confirmed patient has transportation   [facility] CM Short Term Goal #2 (0-30 days)  Patient will be able to report decreased pain in the next 14 days.   [facility] CM Short Term Goal #2 Start Date  04/28/15   Interventions for Short Term Goal #2  Reviewed importance of eating and drinking well. Reviewed medications. Reviewed importance of keeping up with how often bowels are moving as pain medication make  patients constipated.    Tomasa Rand, RN, BSN, CEN [facility] [employer] [phone]

## 2015-04-28 NOTE — Patient Outreach (Signed)
Transiton of care attempt: 04/26/2015   Attempted to reach patient for initial transition of care call. Noted that patient in the ED for abdominal pain.   Plan: Will attempt again.  Tomasa Rand, RN, BSN, CEN [name] [employer] [phone]

## 2015-05-03 DIAGNOSIS — I10 Essential (primary) hypertension: Secondary | ICD-10-CM | POA: Diagnosis not present

## 2015-05-03 DIAGNOSIS — E781 Pure hyperglyceridemia: Secondary | ICD-10-CM | POA: Diagnosis not present

## 2015-05-03 DIAGNOSIS — M150 Primary generalized (osteo)arthritis: Secondary | ICD-10-CM | POA: Diagnosis not present

## 2015-05-05 ENCOUNTER — Other Ambulatory Visit: Payer: Self-pay

## 2015-05-05 NOTE — Patient Outreach (Signed)
[facility]) Care Management   05/05/2015  BRADYN VASSEY [birth year] [id number]  Angelica [name] is an 74 y.o. female Arrived for initial home visit. Patient answered door. Daughter home with patient. Subjective: Patient reports that she is doing well since she got home from the hospital. Reports that her bowels have not moved today, But she is passing gas.  Reports stool from yesterday to be harder than normal. Patient reports that her appetite is better and she is eating and drinking well. Patient reports long history of abdominal pain and now with a recent abdominal surgery for small bowel obstruction. Patient reports she is getting stronger.  Patient reports that she is taking all her medications as prescribed. Patient reports that her daughter helps her with housekeeping and cooking. Patient reports that she has a pending eye appointment on 05/18/2015.   Patient also reports that she normally drives herself to appointment but her daughter has been doing this since she got home from the hospital.   Objective:  Awake and alert., Well dressed. Home clean and neat. Throw rugs noted in dinning room and kitchen. Filed Vitals:   05/05/15 0905  BP: 100/52  Pulse: 78  Resp: 18  Height: 1.651 m ('5\' 5"'$ )  Weight: 151 lb (68.493 kg)  SpO2: 97%    Review of Systems  Constitutional: Positive for weight loss.       Recent weight loss related to bowel obstruction.  Eyes: Negative.   Respiratory: Negative.   Cardiovascular: Negative.   Gastrointestinal: Positive for constipation.  Genitourinary: Negative.  Negative for dysuria.  Musculoskeletal: Negative.   Skin: Negative.   Neurological: Positive for dizziness.  Endo/Heme/Allergies: Negative.   Psychiatric/Behavioral: Negative.     Physical Exam  Constitutional: She is oriented to person, place, and time. She appears well-developed and well-nourished.  Cardiovascular: Normal rate.   Respiratory: Effort normal and breath  sounds normal.  GI: Soft. Bowel sounds are normal.  Musculoskeletal: Normal range of motion.  Neurological: She is alert and oriented to person, place, and time.  Skin: Skin is warm and dry.  Psychiatric: She has a normal mood and affect. Her behavior is normal. Judgment and thought content normal.    Current Medications:   Current Outpatient Prescriptions  Medication Sig Dispense Refill  . acetaminophen (TYLENOL) 500 MG tablet Take 500 mg by mouth daily as needed for mild pain, moderate pain, fever or headache. For pain    . albuterol (PROVENTIL) (2.5 MG/3ML) 0.083% nebulizer solution Take 2.5 mg by nebulization 2 (two) times daily as needed for wheezing or shortness of breath.     . Azelastine HCl (ASTEPRO) 0.15 % SOLN Place 2 sprays into the nose daily.     . budesonide-formoterol (SYMBICORT) 160-4.5 MCG/ACT inhaler Inhale 2 puffs into the lungs daily.    Marland Kitchen docusate sodium (COLACE) 100 MG capsule Take 1 capsule (100 mg total) by mouth 2 (two) times daily. For constipation 60 capsule 3  . loratadine (CLARITIN) 10 MG tablet Take 10 mg by mouth daily as needed. For allergy symptoms    . losartan-hydrochlorothiazide (HYZAAR) 100-12.5 MG per tablet Take 1 tablet by mouth daily.    . methocarbamol (ROBAXIN) 500 MG tablet Take 2 tablets (1,000 mg total) by mouth every 8 (eight) hours as needed for muscle spasms (or pain). 90 tablet 1  . metoCLOPramide (REGLAN) 10 MG tablet Take 10 mg by mouth 3 (three) times daily before meals.    Marland Kitchen omeprazole (PRILOSEC) 40 MG capsule Take  40 mg by mouth daily.    Marland Kitchen oxyCODONE-acetaminophen (PERCOCET/ROXICET) 5-325 MG tablet Take 1 tablet by mouth every 6 (six) hours as needed for moderate pain or severe pain. 30 tablet 0  . Probiotic Product (PROBIOTIC PO) Take 1 capsule by mouth daily.    . metoCLOPramide (REGLAN) 10 MG tablet Take 1 tablet (10 mg total) by mouth 3 (three) times daily before meals. (Patient not taking: Reported on 04/26/2015) 90 tablet 3  .  polyethylene glycol (MIRALAX / GLYCOLAX) packet Take 17 g by mouth daily as needed for mild constipation or moderate constipation. (Patient not taking: Reported on 04/28/2015) 30 each 3  . promethazine (PHENERGAN) 12.5 MG tablet Take 1 tablet (12.5 mg total) by mouth every 6 (six) hours as needed for nausea or vomiting. (Patient not taking: Reported on 05/05/2015) 30 tablet 0   No current facility-administered medications for this visit.    Functional Status:   In your present state of health, do you have any difficulty performing the following activities: 05/05/2015 04/10/2015  Hearing? N -  Vision? Y -  Difficulty concentrating or making decisions? - -  Walking or climbing stairs? Y -  Dressing or bathing? N -  Doing errands, shopping? N N  Preparing Food and eating ? Y -  Using the Toilet? N -  In the past six months, have you accidently leaked urine? N -  Do you have problems with loss of bowel control? N -  Managing your Medications? N -  Managing your Finances? N -  Housekeeping or managing your Housekeeping? Y -    Fall/Depression Screening:    PHQ 2/9 Scores 05/05/2015  PHQ - 2 Score 0   Fall Risk  05/05/2015  Falls in the past year? No   Assessment:   (1) reviewed purpose of [facility] transition of care program, Reviewed consent. Denies any changes needed.  Provided [facility] new patient packet with magnet as patient does not know what happened to the one she got at the hospital. Provided my contact card. Provided [facility] calendar and reviewed community resources. Recent discharge from hospital on 04/24/2015 (2) weight loss noted.  Encouraged patient to eat and drink well.  (3) throw rugs in the home.  Plan:  (1) Patient will remain active in the transition of care program. Next outreach planned for May 11, 2015. Reviewed with patient to call if needed.  (2) encouraged patient to eat and drink well. Suggested she increase her water due to hardened stool.  Encouraged patient to call  MD for increased abdominal pain, fever or vomiting. (3) reviewed with patient the risk of falls with throw rugs. Patient will continue to be aware of fall precautions.  Goal setting and collaboration in the home with patient and priority goal is to avoid readmissions and ED visits.    [facility] CM Care Plan Problem One        Most Recent Value   Care Plan Problem One  Recent hospital admission related to small bowel obstruction   Role Documenting the Problem One  Care Management Coordinator   Care Plan for Problem One  Active   THN Long Term Goal (31-90 days)  Patient will be able to verbalize no readmission in the next 31 days   [facility]N Long Term Goal Start Date  04/28/15   Interventions for Problem One Long Term Goal  Discussed importance of calling MD as soon as she develops symptoms or has any concerns. Instructed patient about the importance of good nutrition  and fluid intake.   [facility] CM Short Term Goal #1 (0-30 days)  Patient will report attending surgical follow up within 5 days.   [facility] CM Short Term Goal #1 Start Date  04/28/15   [facility] CM Short Term Goal #1 Met Date  04/29/15 Barrie Folk met]   Interventions for Short Term Goal #1  Confirmed appointment with daughter and confirmed patient has transportation   [facility] CM Short Term Goal #2 (0-30 days)  Patient will be able to report decreased pain in the next 14 days.   [facility] CM Short Term Goal #2 Start Date  04/28/15   Interventions for Short Term Goal #2  Reviewed importance of eating and drinking well. Reviewed medications. Reviewed importance of keeping up with how often bowels are moving as pain medication make patients constipated.     Tomasa Rand, RN, BSN, CEN [facility] [employer] [phone]

## 2015-05-06 ENCOUNTER — Encounter: Payer: Self-pay | Admitting: Allergy and Immunology

## 2015-05-06 ENCOUNTER — Ambulatory Visit (INDEPENDENT_AMBULATORY_CARE_PROVIDER_SITE_OTHER): Payer: Commercial Managed Care - HMO | Admitting: Allergy and Immunology

## 2015-05-06 VITALS — BP 130/86 | HR 72 | Temp 97.8°F | Resp 16 | Ht 65.0 in | Wt 151.0 lb

## 2015-05-06 DIAGNOSIS — J309 Allergic rhinitis, unspecified: Secondary | ICD-10-CM

## 2015-05-06 DIAGNOSIS — H1010 Acute atopic conjunctivitis, unspecified eye: Secondary | ICD-10-CM | POA: Diagnosis not present

## 2015-05-06 DIAGNOSIS — J4540 Moderate persistent asthma, uncomplicated: Secondary | ICD-10-CM

## 2015-05-06 NOTE — Progress Notes (Signed)
FOLLOW UP NOTE  RE: DAISI KENTNER MRN: [id number] DOB: [birth year] [facility] [place] [address] [place] [address] Date of Office Visit: 05/06/2015  Subjective:  DALEIZA BACCHI is a 74 y.o. female who presents today for Asthma  Assessment:   1. Moderate persistent asthma with normal lung exam and excellent in office spirometry.  2. Allergic rhinoconjunctivitis   3.      Fish allergy--avoidance and emergency action plan in place. 4......Marland KitchenComplex medical history, recent bowel operation. Plan:   Patient Instruction  1.  Avoidance: significant temperature/weather changes. 2.  Antihistamine: Claritin '10mg'$  by mouth once daily for runny nose or itching 3.  Nasal Spray: Qnasl one spray(s) each nostril once daily for stuffy nose or drainage.       Astelin 1-2 sprays each nostril once daily. 4.  Inhalers:  Rescue: ProAir 2 puffs every 4 hours as needed for cough or wheeze.       -May use 2 puffs 10-20 minutes prior to exercise.  Preventative: Symbicort 199mg 2 puffs twice daily (Rinse, gargle, and spit out after use). 5.  Nasal Saline wash each evening in the shower and as needed. 6.  Epi-pen/Benadryl as needed. 7.  Follow up Visit: June 2017 or sooner if needed.  HPI: DMairelyreturns to the office in follow-up of Asthma and allergic rhinitis with her last visit in March, where generally she has done very well.  She denies any recurring albuterol use or recurring symptoms/concerns.  She had a bowel operation (related to adhesions) on December 4th and just recently notes return of normal voice.  She denies any cough, wheeze, shortness of breath or chest symptoms and only rarely notes mild rhinorrhea without congestion, headache, sore throat or fever.  She is pleased with how well she has done.  Denies ED or urgent care visits, prednisone or antibiotic courses. Reports sleep and activity are normal. She typically avoids fish and shellfish without  issue.  Current Medications: 1.  Symbicort 1617m 2 puffs twice daily. 2.  Claritin '10mg'$  once daily. 3.  Qnasl 1-2 sprays once daily. 4.  As needed Astelin and ProAir. 5.  Continues Reglan, Robaxin, Hyzaar, Prilosec, and Probiotic daily. 6.  As needed Colace, Percocet, Miralax and Phenergan.  Drug Allergies: Allergies  Allergen Reactions  . Asa Buff (Mag [Buffered Aspirin] Other (See Comments)    Excessive sweating  . Fish Allergy Other (See Comments)    Unknown reaction  . Other Other (See Comments)    Gelcaps; gel-containing capsules; extended release tablets  . Peanut-Containing Drug Products Other (See Comments)    From allergy test  . Penicillins Itching  . Shellfish-Derived Products Other (See Comments)    From allergy test  . Strawberry Extract Other (See Comments)    Worsened allergy symptoms    Objective:   Filed Vitals:   05/06/15 1244  BP: 130/86  Pulse: 72  Temp: 97.8 F (36.6 C)  Resp: 16   Physical Exam  Constitutional: She is well-developed, well-nourished, and in no distress.  HENT:  Head: Atraumatic.  Right Ear: Tympanic membrane and ear canal normal.  Left Ear: Tympanic membrane and ear canal normal.  Nose: Mucosal edema present. No rhinorrhea. No epistaxis.  Mouth/Throat: Oropharynx is clear and moist and mucous membranes are normal. No oropharyngeal exudate, posterior oropharyngeal edema or posterior oropharyngeal erythema.  Neck: Neck supple.  Cardiovascular: Normal rate, S1 normal and S2 normal.   No murmur heard. Pulmonary/Chest: Effort normal. She has no wheezes.  She has no rhonchi. She has no rales.  Lymphadenopathy:    She has no cervical adenopathy.   Diagnostics: Spirometry FVC 2.23--93%, FEV1 1.89--113%.    Zeferino Mounts M. Ishmael Holter, MD  cc: Foye Spurling, MD

## 2015-05-06 NOTE — Patient Instructions (Signed)
Take Home Sheet  1. Avoidance: significant temperature/weather changes.   2. Antihistamine: Claritin '10mg'$  by mouth once daily for runny nose or itching.   3. Nasal Spray: Qnasl one spray(s) each nostril once daily for stuffy nose or drainage.       Astelin 1-2 sprays each nostril once daily.  4. Inhalers:  Rescue: ProAir 2 puffs every 4 hours as needed for cough or wheeze.       -May use 2 puffs 10-20 minutes prior to exercise.   Preventative: Symbicort 2 puffs twice daily (Rinse, gargle, and spit out after use).   5.  Nasal Saline wash each evening in the shower and as needed.   6. Follow up Visit: June 2017 or sooner if needed.   Websites that have reliable Patient information: 1. American Academy of Asthma, Allergy, & Immunology: www.aaaai.org 2. Food Allergy Network: www.foodallergy.org 3. Mothers of Asthmatics: www.aanma.org 4. [place]: DiningCalendar.de 5. American College of Allergy, Asthma, & Immunology: https://robertson.info/ or www.acaai.org

## 2015-05-11 ENCOUNTER — Other Ambulatory Visit: Payer: Self-pay

## 2015-05-11 NOTE — Patient Outreach (Signed)
Transition of care call: Spoke with patient who reports that she is doing well today. Reports that she is going to see surgeon for follow up today. Reports daughter is driving to her appointment.   States that her bowels are moving daily. Reports that she is eating and drinking well.    Denies any abdominal pain today. Denies any new problems or concerns today.   Plan: Will continue transition of care calls weekly.  [facility] CM Care Plan Problem One        Most Recent Value   Care Plan Problem One  Recent hospital admission related to small bowel obstruction   Role Documenting the Problem One  Care Management [place] for Problem One  Active   THN Long Term Goal (31-90 days)  Patient will be able to verbalize no readmission in the next 31 days   THN Long Term Goal Start Date  04/28/15   Interventions for Problem One Long Term Goal  Encouraged good oral intake. Patient is going to see surgeon today.   [facility] CM Short Term Goal #1 (0-30 days)  Patient will report attending surgical follow up within 5 days.   [facility] CM Short Term Goal #1 Start Date  04/28/15   [facility] CM Short Term Goal #1 Met Date  04/29/15 Barrie Folk met]   Interventions for Short Term Goal #1  Confirmed appointment with daughter and confirmed patient has transportation   [facility] CM Short Term Goal #2 (0-30 days)  Patient will be able to report decreased pain in the next 14 days.   THN CM Short Term Goal #2 Start Date  04/28/15   [facility] CM Short Term Goal #2 Met Date  05/11/15 Barrie Folk met]   Interventions for Short Term Goal #2  Reviewed importance of eating and drinking well. Reviewed medications. Reviewed importance of keeping up with how often bowels are moving as pain medication make patients constipated.      Tomasa Rand, RN, BSN, CEN [facility] [employer] [phone]

## 2015-05-17 ENCOUNTER — Other Ambulatory Visit: Payer: Self-pay

## 2015-05-17 DIAGNOSIS — Z1231 Encounter for screening mammogram for malignant neoplasm of breast: Secondary | ICD-10-CM

## 2015-05-18 ENCOUNTER — Other Ambulatory Visit: Payer: Self-pay

## 2015-05-18 DIAGNOSIS — H40023 Open angle with borderline findings, high risk, bilateral: Secondary | ICD-10-CM | POA: Diagnosis not present

## 2015-05-18 DIAGNOSIS — H25013 Cortical age-related cataract, bilateral: Secondary | ICD-10-CM | POA: Diagnosis not present

## 2015-05-18 DIAGNOSIS — H25043 Posterior subcapsular polar age-related cataract, bilateral: Secondary | ICD-10-CM | POA: Diagnosis not present

## 2015-05-18 DIAGNOSIS — H2513 Age-related nuclear cataract, bilateral: Secondary | ICD-10-CM | POA: Diagnosis not present

## 2015-05-18 NOTE — Patient Outreach (Signed)
Transition of care call: Placed call to patient who answered. Patient reports that she is doing well. Reports that surgeon took out stitches last week. Reports that she has an eye appointment today.  Denies any abdominal pain. Reports bowels are moving well. Reports eating and drinking well.  Denies any current problems or concerns that she needs help with at this time.   Plan: Reviewed [facility] program again with pateint. Discussed with patient that I would be calling again next week and will close case if things continue to do well. Patient is in agreement with plan.  Will outreach patient next week.  [facility]N CM Care Plan Problem One        Most Recent Value   Care Plan Problem One  Recent hospital admission related to small bowel obstruction   Role Documenting the Problem One  Care Management [place] for Problem One  Active   THN Long Term Goal (31-90 days)  Patient will be able to verbalize no readmission in the next 31 days   THN Long Term Goal Start Date  04/28/15   Interventions for Problem One Long Term Goal  Reviewed importance of eating and drinking well. Reviewed importance of notifiying MD for problems or concerns.   [facility]N CM Short Term Goal #1 (0-30 days)  Patient will report attending surgical follow up within 5 days.   [facility]N CM Short Term Goal #1 Start Date  04/28/15   [facility] CM Short Term Goal #1 Met Date  04/29/15 Barrie Folk met]   Interventions for Short Term Goal #1  Confirmed appointment with daughter and confirmed patient has transportation   [facility] CM Short Term Goal #2 (0-30 days)  Patient will be able to report decreased pain in the next 14 days.   [facility]N CM Short Term Goal #2 Start Date  04/28/15   [facility] CM Short Term Goal #2 Met Date  05/11/15 Barrie Folk met]   Interventions for Short Term Goal #2  Reviewed importance of eating and drinking well. Reviewed medications. Reviewed importance of keeping up with how often bowels are moving as pain medication make patients constipated.       Tomasa Rand, RN, BSN, CEN [facility] [employer] [phone]

## 2015-05-26 ENCOUNTER — Other Ambulatory Visit: Payer: Self-pay

## 2015-05-26 NOTE — Patient Outreach (Signed)
Final Transition of care call/ case closure: Placed call to patient who reports that she is doing well. Reports daily bowel movements. Denies any pain at this time. Denies any new needs or concerns:  Plan: Discussed with patient about case closure and patient is in agreement. All case management goals met.  THN CM Care Plan Problem One        Most Recent Value   Care Plan Problem One  Recent hospital admission related to small bowel obstruction   Role Documenting the Problem One  Care Management [place] for Problem One  Active   THN Long Term Goal (31-90 days)  Patient will be able to verbalize no readmission in the next 31 days   THN Long Term Goal Start Date  04/28/15   [facility] Long Term Goal Met Date  05/26/15 Barrie Folk met]   Interventions for Problem One Long Term Goal  Reviewed importance of eating and drinking well. Reviewed importance of notifiying MD for problems or concerns.   THN CM Short Term Goal #1 (0-30 days)  Patient will report attending surgical follow up within 5 days.   THN CM Short Term Goal #1 Start Date  04/28/15   [facility] CM Short Term Goal #1 Met Date  04/29/15 Barrie Folk met]   Interventions for Short Term Goal #1  Confirmed appointment with daughter and confirmed patient has transportation   [facility] CM Short Term Goal #2 (0-30 days)  Patient will be able to report decreased pain in the next 14 days.   THN CM Short Term Goal #2 Start Date  04/28/15   [facility] CM Short Term Goal #2 Met Date  05/11/15 Barrie Folk met]   Interventions for Short Term Goal #2  Reviewed importance of eating and drinking well. Reviewed medications. Reviewed importance of keeping up with how often bowels are moving as pain medication make patients constipated.      Plan: Will close case as goals are met. Will notify MD and patient with case closure letter. Will in basket  Lurline Del with case closure.Tomasa Rand, RN, BSN, CEN [place] Coordinator [phone]

## 2015-06-03 ENCOUNTER — Other Ambulatory Visit: Payer: Self-pay | Admitting: Allergy and Immunology

## 2015-06-07 DIAGNOSIS — H40023 Open angle with borderline findings, high risk, bilateral: Secondary | ICD-10-CM | POA: Diagnosis not present

## 2015-06-07 DIAGNOSIS — H25043 Posterior subcapsular polar age-related cataract, bilateral: Secondary | ICD-10-CM | POA: Diagnosis not present

## 2015-06-07 DIAGNOSIS — H2513 Age-related nuclear cataract, bilateral: Secondary | ICD-10-CM | POA: Diagnosis not present

## 2015-06-07 DIAGNOSIS — H25013 Cortical age-related cataract, bilateral: Secondary | ICD-10-CM | POA: Diagnosis not present

## 2015-06-20 DIAGNOSIS — M150 Primary generalized (osteo)arthritis: Secondary | ICD-10-CM | POA: Diagnosis not present

## 2015-06-20 DIAGNOSIS — I10 Essential (primary) hypertension: Secondary | ICD-10-CM | POA: Diagnosis not present

## 2015-06-20 DIAGNOSIS — E7800 Pure hypercholesterolemia, unspecified: Secondary | ICD-10-CM | POA: Diagnosis not present

## 2015-06-20 DIAGNOSIS — R609 Edema, unspecified: Secondary | ICD-10-CM | POA: Diagnosis not present

## 2015-06-22 DIAGNOSIS — H2511 Age-related nuclear cataract, right eye: Secondary | ICD-10-CM | POA: Diagnosis not present

## 2015-06-27 ENCOUNTER — Ambulatory Visit
Admission: RE | Admit: 2015-06-27 | Discharge: 2015-06-27 | Disposition: A | Discharge status: Home / Self Care | Payer: Medicare HMO | Source: Ambulatory Visit

## 2015-06-27 DIAGNOSIS — Z1231 Encounter for screening mammogram for malignant neoplasm of breast: Secondary | ICD-10-CM | POA: Diagnosis not present

## 2015-06-28 DIAGNOSIS — H25012 Cortical age-related cataract, left eye: Secondary | ICD-10-CM | POA: Diagnosis not present

## 2015-06-28 DIAGNOSIS — H25042 Posterior subcapsular polar age-related cataract, left eye: Secondary | ICD-10-CM | POA: Diagnosis not present

## 2015-06-28 DIAGNOSIS — H2512 Age-related nuclear cataract, left eye: Secondary | ICD-10-CM | POA: Diagnosis not present

## 2015-06-30 ENCOUNTER — Other Ambulatory Visit: Payer: Self-pay | Admitting: Internal Medicine

## 2015-06-30 DIAGNOSIS — R928 Other abnormal and inconclusive findings on diagnostic imaging of breast: Secondary | ICD-10-CM

## 2015-07-06 DIAGNOSIS — H2512 Age-related nuclear cataract, left eye: Secondary | ICD-10-CM | POA: Diagnosis not present

## 2015-07-08 ENCOUNTER — Ambulatory Visit
Admission: RE | Admit: 2015-07-08 | Discharge: 2015-07-08 | Disposition: A | Discharge status: Home / Self Care | Payer: Medicare HMO | Source: Ambulatory Visit | Attending: Internal Medicine | Admitting: Internal Medicine

## 2015-07-08 ENCOUNTER — Other Ambulatory Visit: Payer: Self-pay | Admitting: Internal Medicine

## 2015-07-08 DIAGNOSIS — R921 Mammographic calcification found on diagnostic imaging of breast: Secondary | ICD-10-CM

## 2015-07-08 DIAGNOSIS — R928 Other abnormal and inconclusive findings on diagnostic imaging of breast: Secondary | ICD-10-CM

## 2015-07-15 ENCOUNTER — Ambulatory Visit
Admission: RE | Admit: 2015-07-15 | Discharge: 2015-07-15 | Disposition: A | Discharge status: Home / Self Care | Payer: Medicare HMO | Source: Ambulatory Visit | Attending: Internal Medicine | Admitting: Internal Medicine

## 2015-07-15 DIAGNOSIS — N6012 Diffuse cystic mastopathy of left breast: Secondary | ICD-10-CM | POA: Diagnosis not present

## 2015-07-15 DIAGNOSIS — R921 Mammographic calcification found on diagnostic imaging of breast: Secondary | ICD-10-CM | POA: Diagnosis not present

## 2015-07-27 ENCOUNTER — Encounter: Payer: Self-pay | Admitting: Allergy and Immunology

## 2015-07-27 ENCOUNTER — Ambulatory Visit (INDEPENDENT_AMBULATORY_CARE_PROVIDER_SITE_OTHER): Payer: Commercial Managed Care - HMO | Admitting: Allergy and Immunology

## 2015-07-27 VITALS — BP 110/66 | HR 68 | Temp 97.8°F | Resp 16

## 2015-07-27 DIAGNOSIS — J4540 Moderate persistent asthma, uncomplicated: Secondary | ICD-10-CM

## 2015-07-27 DIAGNOSIS — J309 Allergic rhinitis, unspecified: Secondary | ICD-10-CM | POA: Diagnosis not present

## 2015-07-27 DIAGNOSIS — H1010 Acute atopic conjunctivitis, unspecified eye: Secondary | ICD-10-CM | POA: Diagnosis not present

## 2015-07-27 NOTE — Patient Instructions (Signed)
   Continue Symbicort, Qnasl, Astelin and Claritin daily.  If increasing congestion--May increase Astelin to 2 sprays twice daily.  Use saline nasal wash before medicated nasal sprays.  ProAir HFA as needed every 4 hours.  Follow-up 4 months or sooner if needed.  EpiPen, Benadryl as needed.

## 2015-07-27 NOTE — Progress Notes (Signed)
     FOLLOW UP NOTE  RE: REDITH DRACH MRN: [id number] DOB: November 13, 1940 [facility]  [address] [place] [address] Date of Office Visit: 07/27/2015  Subjective:  Angelica [name] is a 75 y.o. female who presents today for Follow-up  Assessment:   1. Moderate persistent asthma, uncomplicated.   2. Allergic rhinoconjunctivitis, recent nasal symptoms now improved.   3.      Food allergy--avoidance and emergency action plan in place.   4.      Complex medical history. Plan:  No orders of the defined types were placed in this encounter.   Patient Instructions  1.  Continue Symbicort, Qnasl, Astelin and Claritin daily. 2.  If increasing nasal congestion--May increase Astelin to 2 sprays twice daily. 3.  Use saline nasal wash before medicated nasal sprays. 4.  ProAir HFA as needed every 4 hours and call with any recurring use. 5.  Follow-up 4 months or sooner if needed. 6.  EpiPen, Benadryl as needed.  HPI: Angelica [name] returns to the office in follow-up of allergic rhinoconjunctivitis and asthma.  Since her last visit December she describes generally doing well.  She describes mild rhinorrhea, congestion, sneezing, postnasal drip in the last week with rare cough.  She denies wheezing, difficulty breathing, shortness of breath or disrupted activity or sleep.  She feels now about 90% improved.  She has been consistent with her maintenance medications and added Mucinex recently without need for [employer].  She does report cataract removal, since her last visit and much improved energy/activity and appetite with no restrictions to diet and weight back to typical.  She is requesting refills and any samples if available.  Denies ED or urgent care visits, prednisone or antibiotic courses. Reports sleep and activity are normal.  Angelica [name] has a current medication list which includes the following prescription(s): acetaminophen, albuterol, azelastine hcl, docusate  sodium, loratadine, losartan-hydrochlorothiazide, methocarbamol, metoclopramide, metoclopramide, omeprazole, oxycodone-acetaminophen, polyethylene glycol, probiotic product, promethazine, and symbicort.   Drug Allergies: Allergies  Allergen Reactions  . Asa Buff (Mag [Buffered Aspirin] Other (See Comments)    Excessive sweating  . Fish Allergy Other (See Comments)    Unknown reaction  . Other Other (See Comments)    Gelcaps; gel-containing capsules; extended release tablets  . Peanut-Containing Drug Products Other (See Comments)    From allergy test  . Penicillins Itching  . Shellfish-Derived Products Other (See Comments)    From allergy test  . Strawberry Extract Other (See Comments)    Worsened allergy symptoms   Objective:   Filed Vitals:   07/27/15 1605  BP: 110/66  Pulse: 68  Temp: 97.8 F (36.6 C)  Resp: 16   Physical Exam  Constitutional: She is well-developed, well-nourished, and in no distress.  HENT:  Head: Atraumatic.  Right Ear: Tympanic membrane and ear canal normal.  Left Ear: Tympanic membrane and ear canal normal.  Nose: Mucosal edema present. No rhinorrhea. No epistaxis.  Mouth/Throat: Oropharynx is clear and moist and mucous membranes are normal. No oropharyngeal exudate, posterior oropharyngeal edema or posterior oropharyngeal erythema.  Neck: Neck supple.  Cardiovascular: Normal rate, S1 normal and S2 normal.   No murmur heard. Pulmonary/Chest: Effort normal. She has no wheezes. She has no rhonchi. She has no rales.  Lymphadenopathy:    She has no cervical adenopathy.   Diagnostics: Spirometry:  FVC 2.45--101%, FEV1 1.86.--100%.    Damica Gravlin M. Ishmael Holter, MD  cc: Foye Spurling, MD

## 2015-08-03 DIAGNOSIS — H524 Presbyopia: Secondary | ICD-10-CM | POA: Diagnosis not present

## 2015-08-03 DIAGNOSIS — H5203 Hypermetropia, bilateral: Secondary | ICD-10-CM | POA: Diagnosis not present

## 2015-08-03 DIAGNOSIS — H52209 Unspecified astigmatism, unspecified eye: Secondary | ICD-10-CM | POA: Diagnosis not present

## 2015-08-03 DIAGNOSIS — Z0100 Encounter for examination of eyes and vision without abnormal findings: Secondary | ICD-10-CM | POA: Diagnosis not present

## 2015-08-09 DIAGNOSIS — K5900 Constipation, unspecified: Secondary | ICD-10-CM | POA: Diagnosis not present

## 2015-08-09 DIAGNOSIS — R1011 Right upper quadrant pain: Secondary | ICD-10-CM | POA: Diagnosis not present

## 2015-08-09 DIAGNOSIS — K219 Gastro-esophageal reflux disease without esophagitis: Secondary | ICD-10-CM | POA: Diagnosis not present

## 2015-08-22 DIAGNOSIS — M150 Primary generalized (osteo)arthritis: Secondary | ICD-10-CM | POA: Diagnosis not present

## 2015-08-22 DIAGNOSIS — E7800 Pure hypercholesterolemia, unspecified: Secondary | ICD-10-CM | POA: Diagnosis not present

## 2015-08-22 DIAGNOSIS — I10 Essential (primary) hypertension: Secondary | ICD-10-CM | POA: Diagnosis not present

## 2015-08-26 ENCOUNTER — Emergency Department (HOSPITAL_COMMUNITY)
Admission: EM | Admit: 2015-08-26 | Discharge: 2015-08-26 | Disposition: A | Discharge status: Home / Self Care | Payer: Medicare HMO | Attending: Emergency Medicine | Admitting: Emergency Medicine

## 2015-08-26 ENCOUNTER — Emergency Department (HOSPITAL_COMMUNITY): Payer: Medicare HMO

## 2015-08-26 ENCOUNTER — Encounter (HOSPITAL_COMMUNITY): Payer: Self-pay | Admitting: Emergency Medicine

## 2015-08-26 DIAGNOSIS — Z79899 Other long term (current) drug therapy: Secondary | ICD-10-CM | POA: Diagnosis not present

## 2015-08-26 DIAGNOSIS — R51 Headache: Secondary | ICD-10-CM | POA: Diagnosis not present

## 2015-08-26 DIAGNOSIS — R110 Nausea: Secondary | ICD-10-CM | POA: Insufficient documentation

## 2015-08-26 DIAGNOSIS — K219 Gastro-esophageal reflux disease without esophagitis: Secondary | ICD-10-CM | POA: Diagnosis not present

## 2015-08-26 DIAGNOSIS — Z880 Allergy status to penicillin: Secondary | ICD-10-CM | POA: Diagnosis not present

## 2015-08-26 DIAGNOSIS — Z87891 Personal history of nicotine dependence: Secondary | ICD-10-CM | POA: Diagnosis not present

## 2015-08-26 DIAGNOSIS — J45909 Unspecified asthma, uncomplicated: Secondary | ICD-10-CM | POA: Insufficient documentation

## 2015-08-26 DIAGNOSIS — R079 Chest pain, unspecified: Secondary | ICD-10-CM | POA: Diagnosis not present

## 2015-08-26 DIAGNOSIS — I10 Essential (primary) hypertension: Secondary | ICD-10-CM | POA: Insufficient documentation

## 2015-08-26 DIAGNOSIS — R519 Headache, unspecified: Secondary | ICD-10-CM

## 2015-08-26 LAB — BASIC METABOLIC PANEL
Anion gap: 11 (ref 5–15)
BUN: 9 mg/dL (ref 6–20)
CO2: 27 mmol/L (ref 22–32)
Calcium: 9.9 mg/dL (ref 8.9–10.3)
Chloride: 101 mmol/L (ref 101–111)
Creatinine, Ser: 0.74 mg/dL (ref 0.44–1.00)
GFR calc Af Amer: >60 mL/min (ref >60)
GFR calc non Af Amer: >60 mL/min (ref >60)
Glucose, Bld: 100 mg/dL — ABNORMAL HIGH (ref 65–99)
Potassium: 3.2 mmol/L — ABNORMAL LOW (ref 3.5–5.1)
Sodium: 139 mmol/L (ref 135–145)

## 2015-08-26 LAB — CBC
HCT: 44 % (ref 36.0–46.0)
Hemoglobin: 14.7 g/dL (ref 12.0–15.0)
MCH: 29.5 pg (ref 26.0–34.0)
MCHC: 33.4 g/dL (ref 30.0–36.0)
MCV: 88.4 fL (ref 78.0–100.0)
Platelets: 242 10*3/uL (ref 150–400)
RBC: 4.98 MIL/uL (ref 3.87–5.11)
RDW: 13.6 % (ref 11.5–15.5)
WBC: 6.5 10*3/uL (ref 4.0–10.5)

## 2015-08-26 LAB — I-STAT TROPONIN, ED: Troponin i, poc: 0 ng/mL (ref 0.00–0.08)

## 2015-08-26 IMAGING — DX DG CHEST 2V
2 series · 2 of 2 positions shown · non-contrast
Comparison: [DATE].

CLINICAL DATA: Chest pain.

EXAM:
CHEST  2 VIEW

[chest pa]
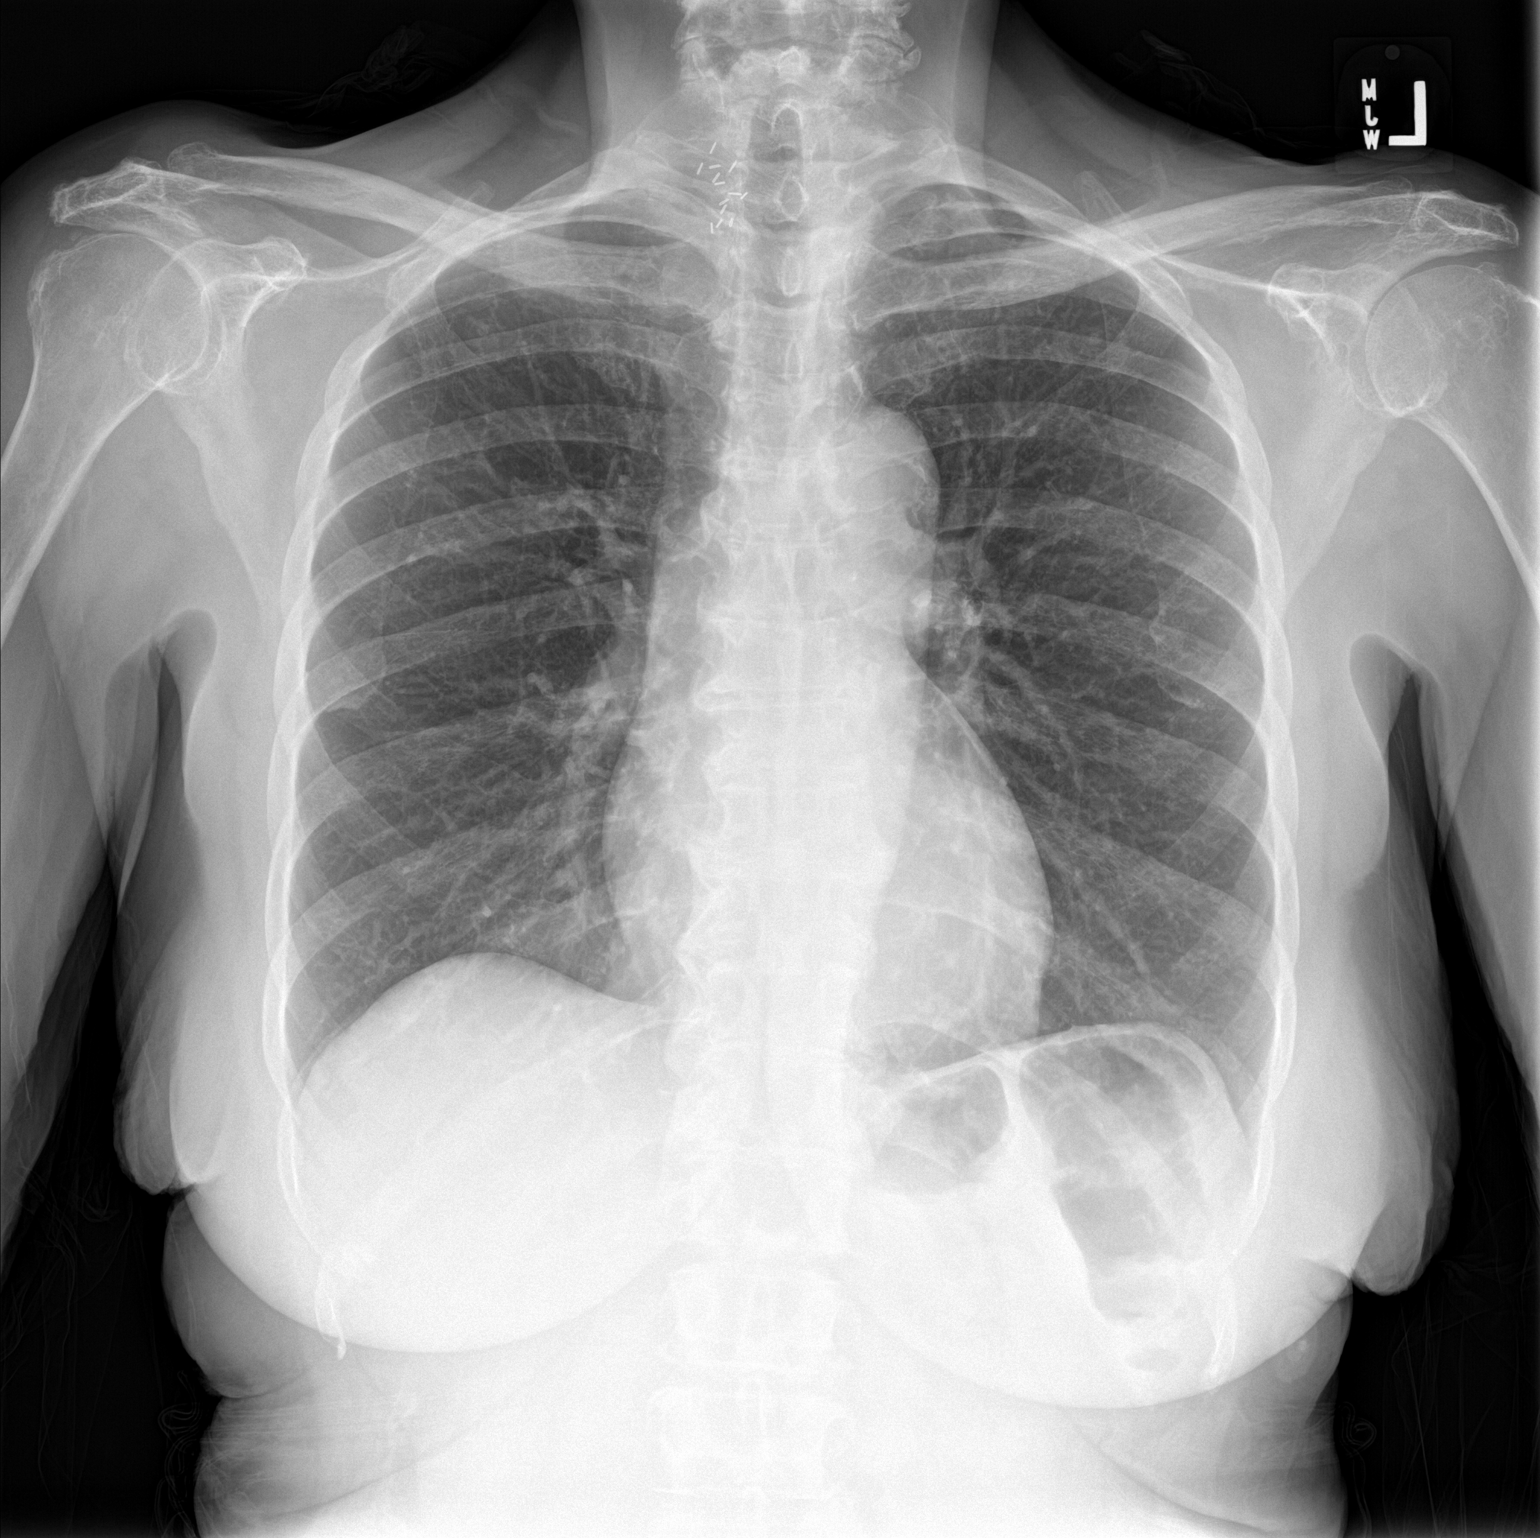

[chest lat]
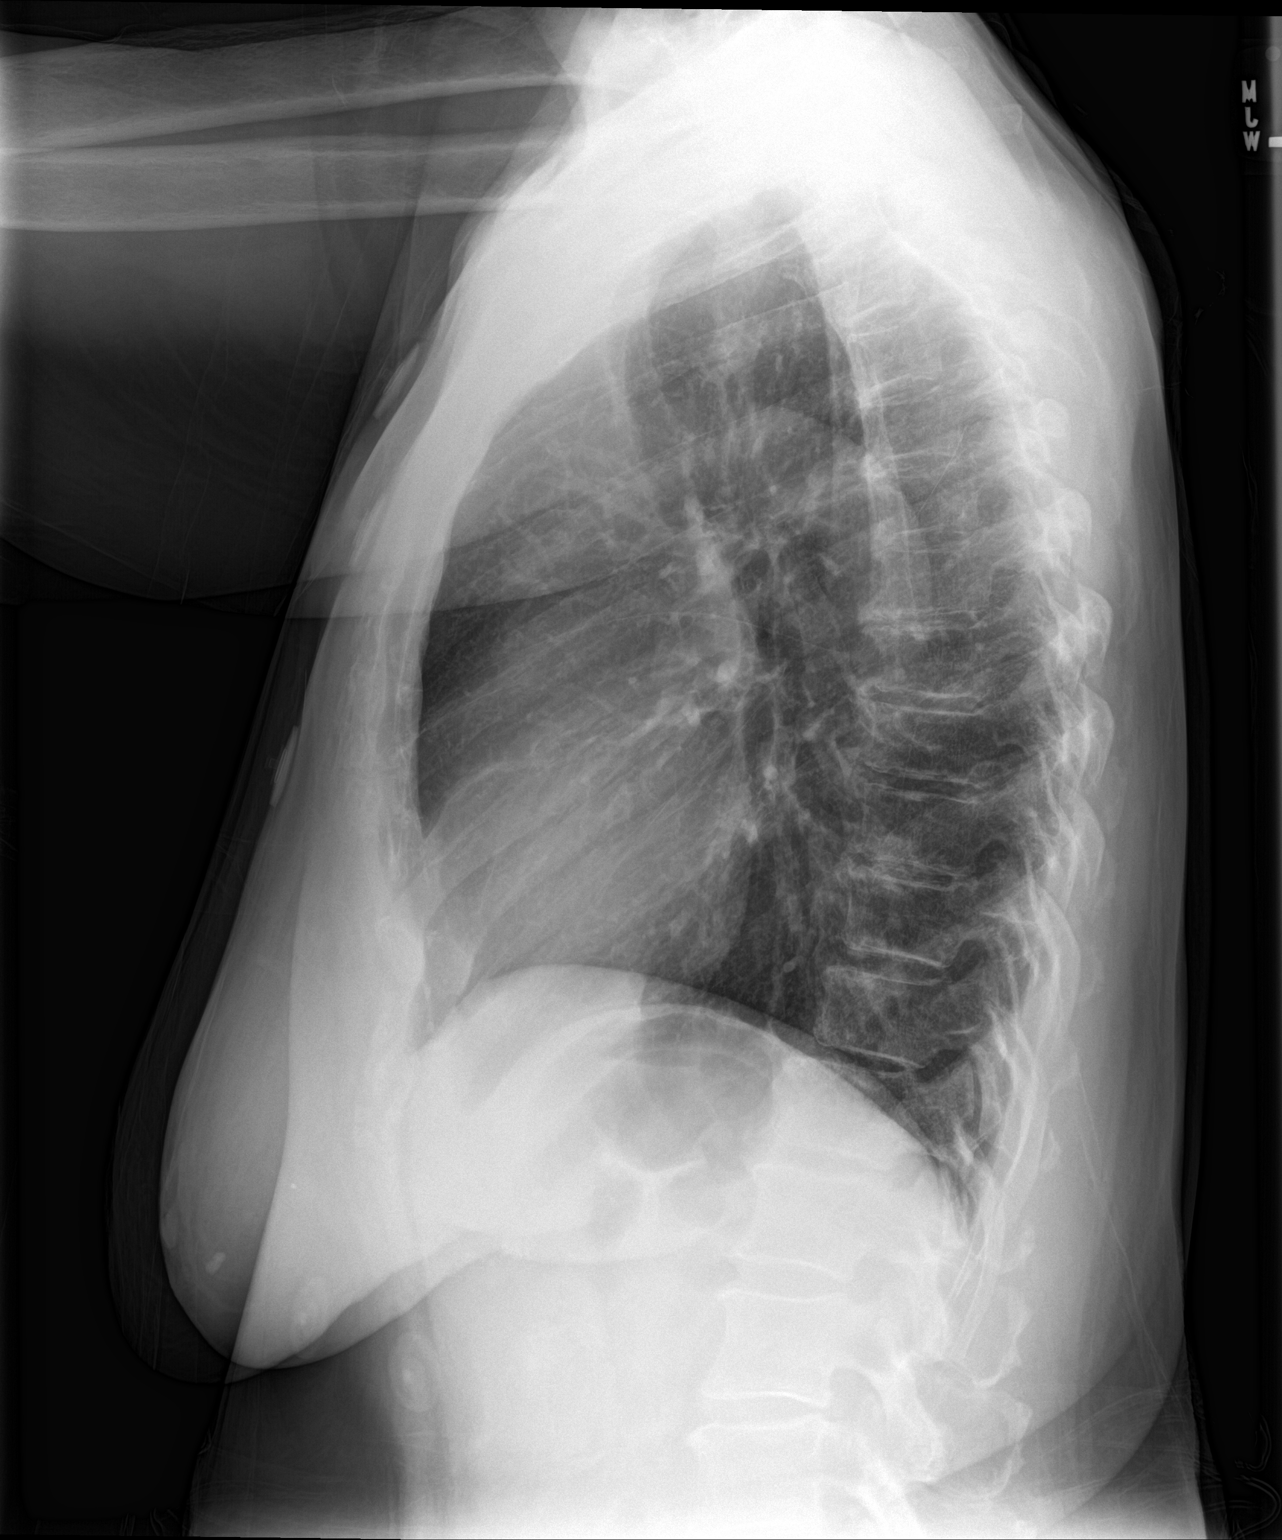

[2 of 2 positions shown; findings below may reference images not displayed]

FINDINGS: The heart size and mediastinal contours are within normal limits.
Both lungs are clear. No pneumothorax or pleural effusion is noted.
The visualized skeletal structures are unremarkable.
IMPRESSION: No active cardiopulmonary disease.

## 2015-08-26 MED ORDER — POTASSIUM CHLORIDE CRYS ER 20 MEQ PO TBCR
40.0000 meq | EXTENDED_RELEASE_TABLET | Freq: Once | ORAL | Status: AC
  Administered 2015-08-26: 40 meq via ORAL
  Filled 2015-08-26: qty 2

## 2015-08-26 MED ORDER — TRAMADOL HCL 50 MG PO TABS
50.0000 mg | ORAL_TABLET | Freq: Once | ORAL | Status: AC
  Administered 2015-08-26: 50 mg via ORAL
  Filled 2015-08-26: qty 1

## 2015-08-26 NOTE — ED Notes (Signed)
Patient undressed, in gown, on monitor, continuous pulse oximetry and blood pressure cuff; warm blanket given

## 2015-08-26 NOTE — ED Notes (Signed)
Pt is in stable condition upon d/c and is escorted from ED via wheelchair. 

## 2015-08-26 NOTE — ED Provider Notes (Signed)
CSN: [id number]     Arrival date & time 08/26/15  1143 History   First MD Initiated Contact with Patient 08/26/15 1405     Chief Complaint  Patient presents with  . Chest Pain     (Consider location/radiation/quality/duration/timing/severity/associated sxs/prior Treatment) HPI   75 year old female with cough and facial congestion. Patient has not felt well for the past week or so. Mild generalized weakness. Not much of an appetite. The past 2-3 days she has had a nonproductive cough. No fevers or chills. Denies shortness of breath. No unusual leg pain or swelling. She has felt "stuffy." Inside mild facial pressure/frontal headache. Triage note was reviewed. She did not specifically mention chest pain to me.  Past Medical History  Diagnosis Date  . Hypertension   . Asthma   . Environmental allergies   . GERD (gastroesophageal reflux disease)   . H. pylori infection   . Bowel obstruction [facility])    Past Surgical History  Procedure Laterality Date  . Knee surgery    . Nasal sinus surgery    . Thyroid surgery    . Laparotomy N/A 04/18/2015    Procedure: Exploratory laparotomy with lysis of adhesions, possible bowel resection;  Surgeon: Ralene Ok, MD;  Location: [place];  Service: General;  Laterality: N/A;  . Abdominal hysterectomy    . Colon surgery     History reviewed. No pertinent family history. Social History  Substance Use Topics  . Smoking status: Former Smoker    Types: Cigarettes  . Smokeless tobacco: Never Used  . Alcohol Use: No   OB History    No data available     Review of Systems  All systems reviewed and negative, other than as noted in HPI.   Allergies  Asa buff (mag; Fish allergy; Other; Peanut-containing drug products; Penicillins; Shellfish-derived products; and Strawberry extract  Home Medications   Prior to Admission medications   Medication Sig Start Date End Date Taking? Authorizing Provider  albuterol (PROVENTIL) (2.5 MG/3ML) 0.083%  nebulizer solution Take 2.5 mg by nebulization 2 (two) times daily as needed for wheezing or shortness of breath.    Yes Historical Provider, MD  Azelastine HCl (ASTEPRO) 0.15 % SOLN Place 2 sprays into the nose daily.    Yes Historical Provider, MD  docusate sodium (COLACE) 100 MG capsule Take 1 capsule (100 mg total) by mouth 2 (two) times daily. For constipation Patient taking differently: Take 100 mg by mouth daily as needed. For constipation 04/24/15  Yes Ripudeep Krystal Eaton, MD  methocarbamol (ROBAXIN) 500 MG tablet Take 2 tablets (1,000 mg total) by mouth every 8 (eight) hours as needed for muscle spasms (or pain). 04/24/15  Yes Ripudeep Krystal Eaton, MD  metoCLOPramide (REGLAN) 10 MG tablet Take 1 tablet (10 mg total) by mouth 3 (three) times daily before meals. 04/24/15  Yes Ripudeep Krystal Eaton, MD  SYMBICORT 160-4.5 MCG/ACT inhaler INHALE TWO PUFFS BY MOUTH TWICE DAILY TO  PREVENT  COUGH  OR  WHEEZE.  RINSE,  GARGLE,  AND  SPIT  AFTER  USE 06/03/15  Yes Roselyn Malachy Moan, MD  acetaminophen (TYLENOL) 500 MG tablet Take 500 mg by mouth daily as needed for mild pain, moderate pain, fever or headache. For pain    Historical Provider, MD  loratadine (CLARITIN) 10 MG tablet Take 10 mg by mouth daily as needed. For allergy symptoms    Historical Provider, MD  losartan-hydrochlorothiazide (HYZAAR) 100-12.5 MG per tablet Take 1 tablet by mouth daily.  Historical Provider, MD  omeprazole (PRILOSEC) 40 MG capsule Take 40 mg by mouth daily.    Historical Provider, MD  oxyCODONE-acetaminophen (PERCOCET/ROXICET) 5-325 MG tablet Take 1 tablet by mouth every 6 (six) hours as needed for moderate pain or severe pain. 04/24/15   Ripudeep Krystal Eaton, MD  polyethylene glycol (MIRALAX / GLYCOLAX) packet Take 17 g by mouth daily as needed for mild constipation or moderate constipation. 04/24/15   Ripudeep Krystal Eaton, MD  Probiotic Product (PROBIOTIC PO) Take 1 capsule by mouth daily.    Historical Provider, MD  promethazine (PHENERGAN) 12.5 MG  tablet Take 1 tablet (12.5 mg total) by mouth every 6 (six) hours as needed for nausea or vomiting. 04/24/15   Ripudeep K Rai, MD   BP 118/74 mmHg  Pulse 88  Temp(Src) 98.9 F (37.2 C) (Oral)  Resp 21  SpO2 100% Physical Exam  Constitutional: She appears well-developed and well-nourished. No distress.  HENT:  Head: Normocephalic and atraumatic.  Eyes: Conjunctivae are normal. Right eye exhibits no discharge. Left eye exhibits no discharge.  Neck: Neck supple.  Cardiovascular: Normal rate, regular rhythm and normal heart sounds.  Exam reveals no gallop and no friction rub.   No murmur heard. Pulmonary/Chest: Effort normal and breath sounds normal. No respiratory distress. She exhibits no tenderness.  Abdominal: Soft. She exhibits no distension. There is no tenderness.  Musculoskeletal: She exhibits no edema or tenderness.  Lower extremities symmetric as compared to each other. No calf tenderness. Negative Homan's. No palpable cords.   Neurological: She is alert.  Skin: Skin is warm and dry.  Psychiatric: She has a normal mood and affect. Her behavior is normal. Thought content normal.  Nursing note and vitals reviewed.   ED Course  Procedures (including critical care time) Labs Review Labs Reviewed  BASIC METABOLIC PANEL - Abnormal; Notable for the following:    Potassium 3.2 (*)    Glucose, Bld 100 (*)    All other components within normal limits  CBC  I-STAT TROPOININ, ED  Randolm Idol, ED    Imaging Review Dg Chest 2 View  08/26/2015  CLINICAL DATA:  Chest pain. EXAM: CHEST  2 VIEW COMPARISON:  August 04, 2014. FINDINGS: The heart size and mediastinal contours are within normal limits. Both lungs are clear. No pneumothorax or pleural effusion is noted. The visualized skeletal structures are unremarkable. IMPRESSION: No active cardiopulmonary disease. Electronically Signed   By: Marijo Conception, M.D.   On: 08/26/2015 12:31   I have personally reviewed and evaluated these  images and lab results as part of my medical decision-making.   EKG Interpretation   Date/Time:  Friday August 26 2015 11:48:58 EDT Ventricular Rate:  81 PR Interval:  152 QRS Duration: 86 QT Interval:  388 QTC Calculation: 450 R Axis:   -36 Text Interpretation:  Normal sinus rhythm Possible Left atrial enlargement  Left axis deviation Nonspecific ST abnormality Abnormal ECG Confirmed by  Aseel Truxillo  MD, Kijuana Ruppel ([id number]) on 08/26/2015 1:54:39 PM      MDM   Final diagnoses:  Sinus headache  Nausea    75 year old female cough, sinus congestion and facial pressure. Suspect viral illness or possibly allergies. Apparently she was complaining some left-sided chest pain to nursing. She denies this to me. She has had a nonproductive cough but denies dyspnea and she has no increased work of breathing on exam. Workup is been fairly unremarkable. I have low suspicion for emergent process. Return precautions for discussed. Outpatient follow-up otherwise.  Virgel Manifold, MD 09/02/15 1253

## 2015-08-26 NOTE — ED Notes (Signed)
Pt arrives via POV from home with left sided chest pains this morning upon waking. States feels weak allover. Pt with decreased appetite over the last week. States took full '324mg'$  ASA PTA. Pt denies hx of heart problems. VSS.

## 2015-08-30 ENCOUNTER — Emergency Department (HOSPITAL_COMMUNITY): Payer: Medicare HMO

## 2015-08-30 ENCOUNTER — Emergency Department (HOSPITAL_COMMUNITY)
Admission: EM | Admit: 2015-08-30 | Discharge: 2015-08-30 | Disposition: A | Discharge status: Home / Self Care | Payer: Medicare HMO | Attending: Emergency Medicine | Admitting: Emergency Medicine

## 2015-08-30 ENCOUNTER — Encounter (HOSPITAL_COMMUNITY): Payer: Self-pay | Admitting: *Deleted

## 2015-08-30 DIAGNOSIS — J45909 Unspecified asthma, uncomplicated: Secondary | ICD-10-CM | POA: Insufficient documentation

## 2015-08-30 DIAGNOSIS — Z8619 Personal history of other infectious and parasitic diseases: Secondary | ICD-10-CM | POA: Insufficient documentation

## 2015-08-30 DIAGNOSIS — Z880 Allergy status to penicillin: Secondary | ICD-10-CM | POA: Insufficient documentation

## 2015-08-30 DIAGNOSIS — R05 Cough: Secondary | ICD-10-CM | POA: Diagnosis not present

## 2015-08-30 DIAGNOSIS — Z79899 Other long term (current) drug therapy: Secondary | ICD-10-CM | POA: Insufficient documentation

## 2015-08-30 DIAGNOSIS — K219 Gastro-esophageal reflux disease without esophagitis: Secondary | ICD-10-CM | POA: Diagnosis not present

## 2015-08-30 DIAGNOSIS — J411 Mucopurulent chronic bronchitis: Secondary | ICD-10-CM | POA: Diagnosis not present

## 2015-08-30 DIAGNOSIS — Z87891 Personal history of nicotine dependence: Secondary | ICD-10-CM | POA: Diagnosis not present

## 2015-08-30 DIAGNOSIS — R053 Chronic cough: Secondary | ICD-10-CM

## 2015-08-30 DIAGNOSIS — R509 Fever, unspecified: Secondary | ICD-10-CM | POA: Diagnosis not present

## 2015-08-30 DIAGNOSIS — I10 Essential (primary) hypertension: Secondary | ICD-10-CM | POA: Insufficient documentation

## 2015-08-30 IMAGING — DX DG CHEST 2V
2 series · 2 of 2 positions shown · non-contrast
Comparison: [DATE]

CLINICAL DATA: Cough, congestion, fever for 2 days

EXAM:
CHEST  2 VIEW

[w chest pa]
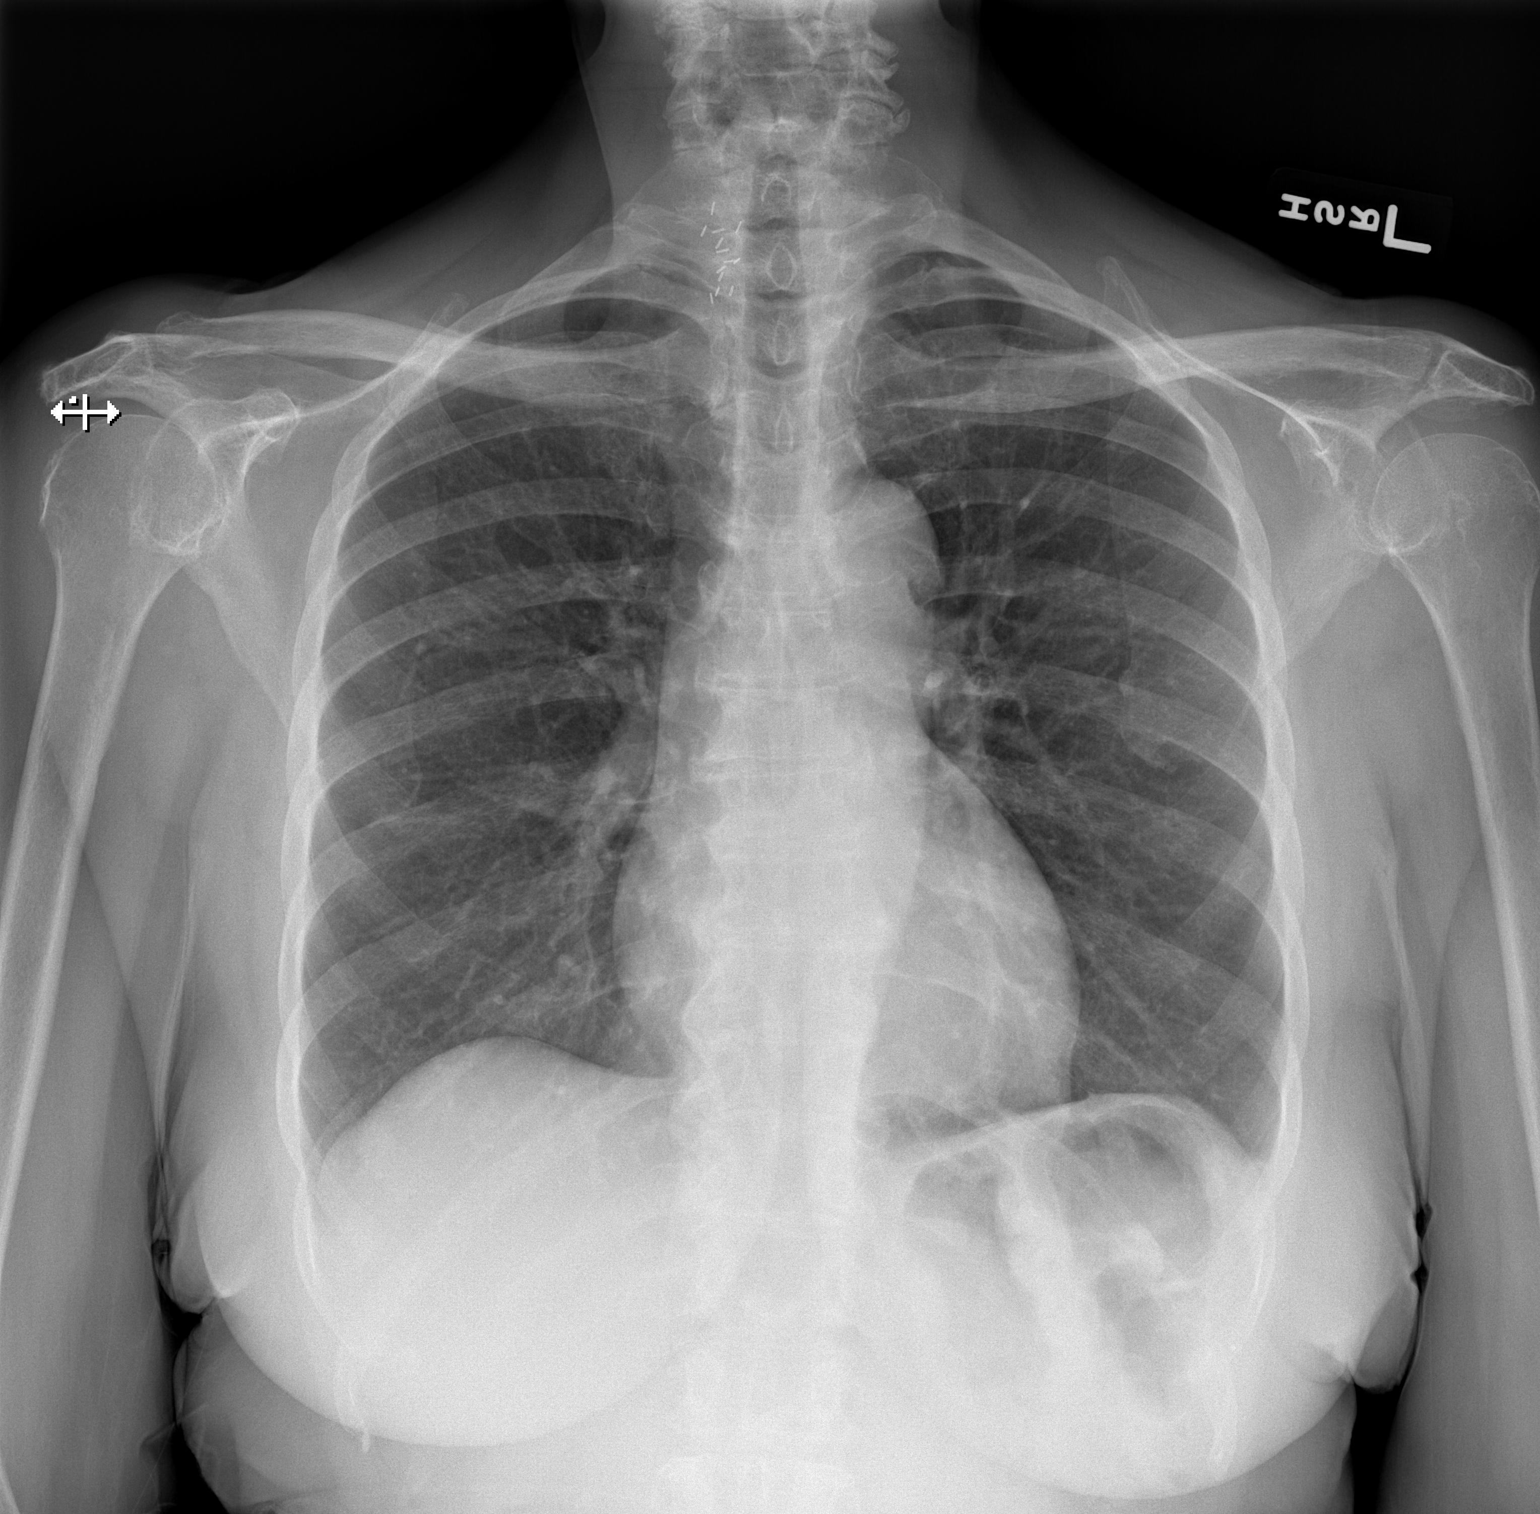

[w chest lat]
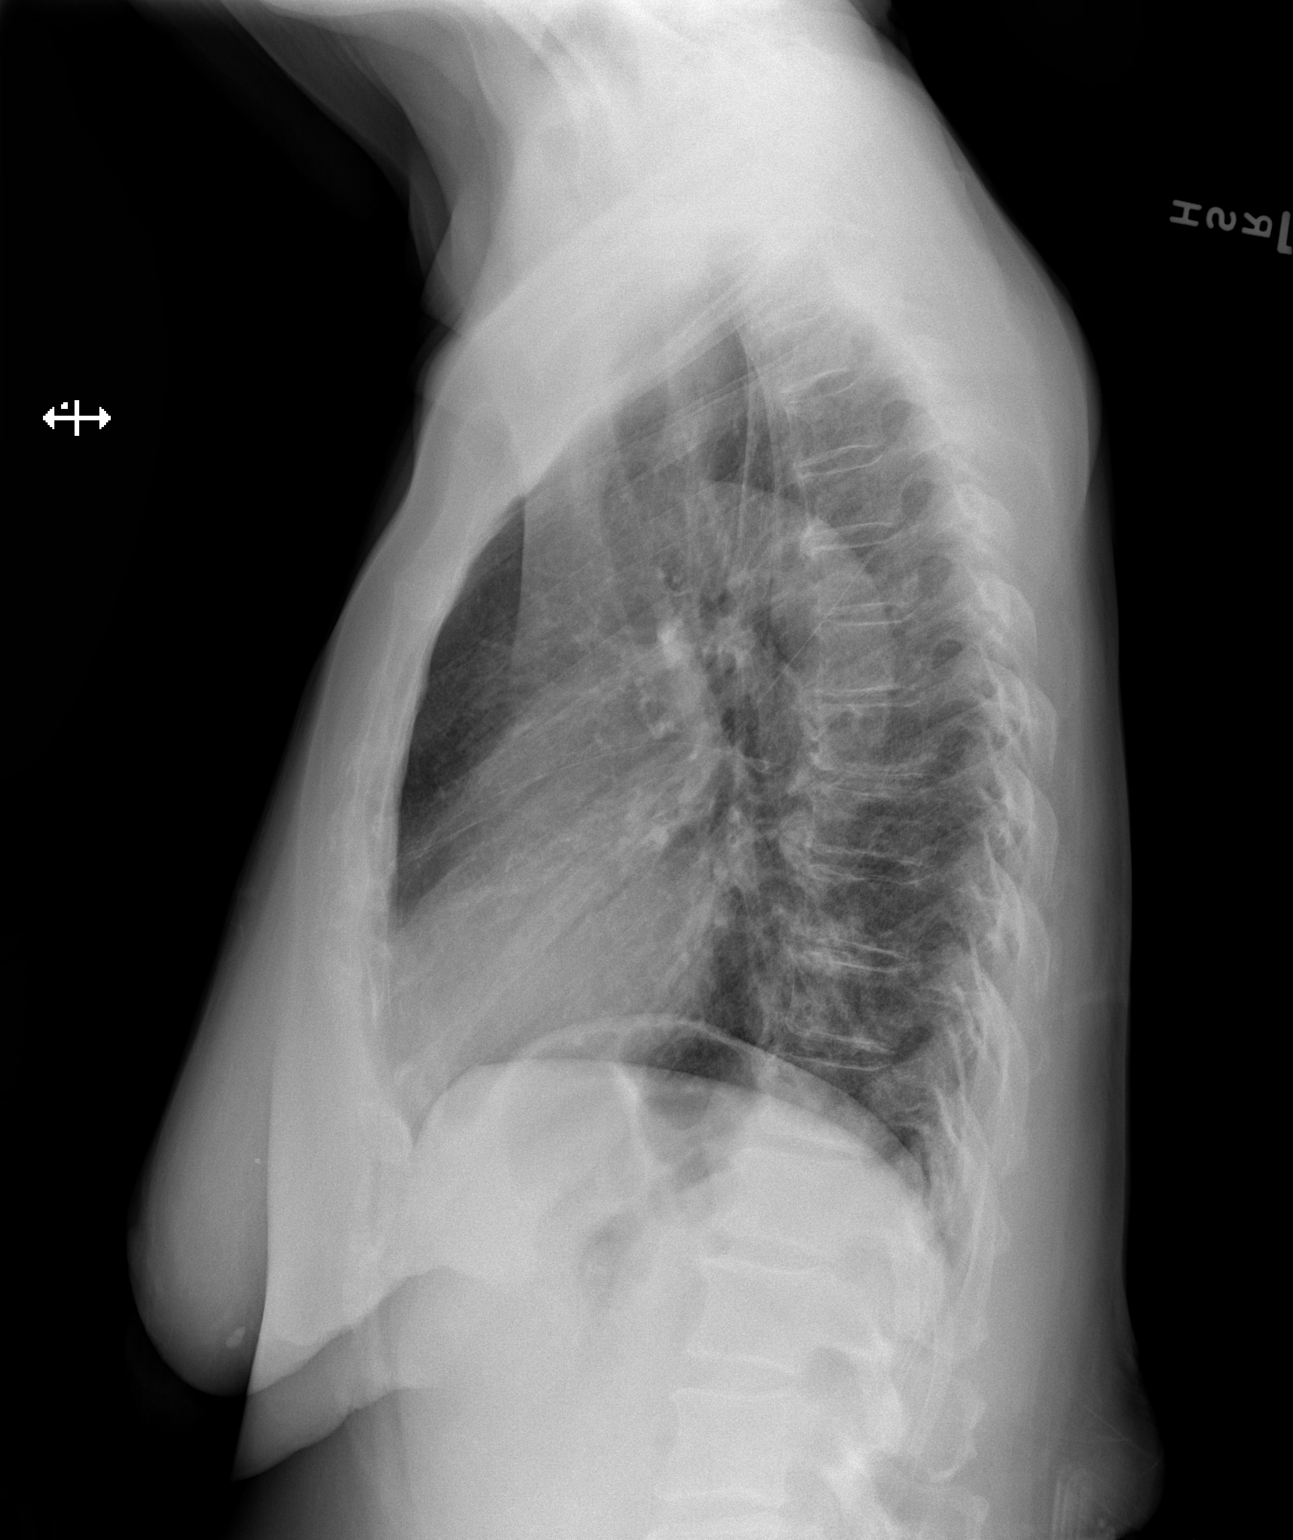

[2 of 2 positions shown; findings below may reference images not displayed]

FINDINGS: The heart size and mediastinal contours are within normal limits.
Both lungs are clear. The visualized skeletal structures are
unremarkable.
IMPRESSION: No active cardiopulmonary disease.

## 2015-08-30 MED ORDER — BENZONATATE 100 MG PO CAPS: 100.0000 mg | ORAL_CAPSULE | Freq: Three times a day (TID) | ORAL | Status: DC

## 2015-08-30 MED ORDER — AZITHROMYCIN 250 MG PO TABS: 250.0000 mg | ORAL_TABLET | Freq: Every day | ORAL | Status: DC

## 2015-08-30 NOTE — Discharge Instructions (Signed)
You were seen in the ER for the cough. Please take the meds prescribed. See your doctor in 1 week.   Chronic Bronchitis Chronic bronchitis is a lasting inflammation of the bronchial tubes, which are the tubes that carry air into your lungs. This is inflammation that occurs:   On most days of the week.   For at least three months at a time.   Over a period of two years in a row. When the bronchial tubes are inflamed, they start to produce mucus. The inflammation and buildup of mucus make it more difficult to breathe. Chronic bronchitis is usually a permanent problem and is one type of chronic obstructive pulmonary disease (COPD). People with chronic bronchitis are at greater risk for getting repeated colds, or respiratory infections. CAUSES  Chronic bronchitis most often occurs in people who have:  Long-standing, severe asthma.  A history of smoking.  Asthma and who also smoke. SIGNS AND SYMPTOMS  Chronic bronchitis may cause the following:   A cough that brings up mucus (productive cough).  Shortness of breath.  Early morning headache.  Wheezing.  Chest discomfort.   Recurring respiratory infections. DIAGNOSIS  Your health care provider may confirm the diagnosis by:  Taking your medical history.  Performing a physical exam.  Taking a chest X-ray.   Performing pulmonary function tests. TREATMENT  Treatment involves controlling symptoms with medicines, oxygen therapy, or making lifestyle changes, such as exercising and eating a healthy, well-balanced diet. Medicines could include:  Inhalers to improve air flow in and out of your lungs.  Antibiotics to treat bacterial infections, such as pneumonia, sinus infections, and acute bronchitis. As a preventative measure, your health care provider may recommend routine vaccinations for influenza and pneumonia. This is to prevent infection and hospitalization since you may be more at risk for these types of infections.   HOME CARE INSTRUCTIONS  Take medicines only as directed by your health care provider.   If you smoke cigarettes, chew tobacco, or use electronic cigarettes, quit. If you need help quitting, ask your health care provider.  Avoid pollen, dust, animal dander, molds, smoke, and other things that cause shortness of breath or wheezing attacks.  Talk to your health care provider about possible exercise routines. Regular exercise is very important to help you feel better.  If you are prescribed oxygen use at home follow these guidelines:  Never smoke while using oxygen. Oxygen does not burn or explode, but flammable materials will burn faster in the presence of oxygen.  Keep a Data processing manager close by. Let your fire department know that you have oxygen in your home.  Warn visitors not to smoke near you when you are using oxygen. Put up "no smoking" signs in your home where you most often use the oxygen.  Regularly test your smoke detectors at home to make sure they work. If you receive care in your home from a nurse or other health care provider, he or she may also check to make sure your smoke detectors work.  Ask your health care provider whether you would benefit from a pulmonary rehabilitation program.  Do not wait to get medical care if you have any concerning symptoms. Delays could cause permanent injury and may be life threatening. SEEK MEDICAL CARE IF:  You have increased coughing or shortness of breath or both.  You have muscle aches.  You have chest pain.  Your mucus gets thicker.  Your mucus changes from clear or white to yellow, green, gray, or  bloody. SEEK IMMEDIATE MEDICAL CARE IF:  Your usual medicines do not stop your wheezing.   You have increased difficulty breathing.   You have any problems with the medicine you are taking, such as a rash, itching, swelling, or trouble breathing. MAKE SURE YOU:   Understand these instructions.  Will watch your  condition.  Will get help right away if you are not doing well or get worse.   This information is not intended to replace advice given to you by your health care provider. Make sure you discuss any questions you have with your health care provider.   Document Released: 02/22/2006 Document Revised: 05/28/2014 Document Reviewed: 06/15/2013 Elsevier Interactive Patient Education [employer].

## 2015-08-30 NOTE — ED Notes (Signed)
Pt was seen here Friday. Pt was diagnosed with sinus infection and no new meds.  Pt feels like chest congested and states coughing up yellow sputum.

## 2015-08-30 NOTE — ED Provider Notes (Signed)
CSN: [id number]     Arrival date & time 08/30/15  4270 History   First MD Initiated Contact with Patient 08/30/15 0815     Chief Complaint  Patient presents with  . Cough     (Consider location/radiation/quality/duration/timing/severity/associated sxs/prior Treatment) HPI Comments: 75 y/o female hx of smoking (not active), allergies, chronic cough and sinus congestion and asthma presents with worsening cough with intermittent yellow sputum x5 days. States that she presented to the ED Friday with a cough, worsening congestion and mild chest tightness, and she was discharged once the cardiac workup was negative. Pt reports that her cough is worsening, thus she decided to come back to the ER. Notes associated rhinorrhea and post nasal drip - but they are not new problems. Cough is slightly worse than usual, and she now has a yellow phlegm coming out. Pt is adherent to asthmatic medical management including Claritin, nebulized albuterol, Symbicort, QNasyl, and Astepro. Today denies blood-tinged sputum, CP, SOB, lower extremity edema.     Patient is a 75 y.o. female presenting with cough. The history is provided by the patient and medical records.  Cough Associated symptoms: rhinorrhea   Associated symptoms: no chest pain, no fever, no shortness of breath and no sore throat     Past Medical History  Diagnosis Date  . Hypertension   . Asthma   . Environmental allergies   . GERD (gastroesophageal reflux disease)   . H. pylori infection   . Bowel obstruction [facility])    Past Surgical History  Procedure Laterality Date  . Knee surgery    . Nasal sinus surgery    . Thyroid surgery    . Laparotomy N/A 04/18/2015    Procedure: Exploratory laparotomy with lysis of adhesions, possible bowel resection;  Surgeon: Ralene Ok, MD;  Location: [place];  Service: General;  Laterality: N/A;  . Abdominal hysterectomy    . Colon surgery     No family history on file. Social History  Substance Use  Topics  . Smoking status: Former Smoker    Types: Cigarettes  . Smokeless tobacco: Never Used  . Alcohol Use: No   OB History    No data available     Review of Systems  Constitutional: Negative for fever.  HENT: Positive for congestion and rhinorrhea. Negative for sore throat.   Respiratory: Positive for cough. Negative for shortness of breath.   Cardiovascular: Negative for chest pain.  Allergic/Immunologic: Positive for environmental allergies.      Allergies  Asa buff (mag; Fish allergy; Other; Peanut-containing drug products; Penicillins; Shellfish-derived products; and Strawberry extract  Home Medications   Prior to Admission medications   Medication Sig Start Date End Date Taking? Authorizing Provider  acetaminophen (TYLENOL) 500 MG tablet Take 500 mg by mouth daily as needed for mild pain, moderate pain, fever or headache. For pain   Yes Historical Provider, MD  albuterol (PROVENTIL) (2.5 MG/3ML) 0.083% nebulizer solution Take 2.5 mg by nebulization 2 (two) times daily as needed for wheezing or shortness of breath.    Yes Historical Provider, MD  Azelastine HCl (ASTEPRO) 0.15 % SOLN Place 2 sprays into the nose daily.    Yes Historical Provider, MD  Beclomethasone Dipropionate (QNASL) 80 MCG/ACT AERS Place 1 spray into the nose as needed (congestion).   Yes Historical Provider, MD  docusate sodium (COLACE) 100 MG capsule Take 1 capsule (100 mg total) by mouth 2 (two) times daily. For constipation Patient taking differently: Take 100 mg by mouth daily. For  constipation 04/24/15  Yes Ripudeep Krystal Eaton, MD  guaiFENesin (MUCINEX) 600 MG 12 hr tablet Take 600 mg by mouth daily as needed for cough or to loosen phlegm.   Yes Historical Provider, MD  loratadine (CLARITIN) 10 MG tablet Take 10 mg by mouth daily as needed. For allergy symptoms   Yes Historical Provider, MD  losartan-hydrochlorothiazide (HYZAAR) 100-12.5 MG per tablet Take 1 tablet by mouth daily.   Yes Historical  Provider, MD  methocarbamol (ROBAXIN) 500 MG tablet Take 2 tablets (1,000 mg total) by mouth every 8 (eight) hours as needed for muscle spasms (or pain). 04/24/15  Yes Ripudeep Krystal Eaton, MD  omeprazole (PRILOSEC) 40 MG capsule Take 40 mg by mouth daily as needed. For acid reflux   Yes Historical Provider, MD  Probiotic Product (PROBIOTIC PO) Take 1 capsule by mouth daily.   Yes Historical Provider, MD  SYMBICORT 160-4.5 MCG/ACT inhaler INHALE TWO PUFFS BY MOUTH TWICE DAILY TO  PREVENT  COUGH  OR  WHEEZE.  RINSE,  GARGLE,  AND  SPIT  AFTER  USE 06/03/15  Yes Roselyn Malachy Moan, MD  azithromycin (ZITHROMAX) 250 MG tablet Take 1 tablet (250 mg total) by mouth daily. Take first 2 tablets together, then 1 every day until finished. 08/30/15   Varney Biles, MD  benzonatate (TESSALON) 100 MG capsule Take 1 capsule (100 mg total) by mouth every 8 (eight) hours. 08/30/15   Varney Biles, MD  metoCLOPramide (REGLAN) 10 MG tablet Take 1 tablet (10 mg total) by mouth 3 (three) times daily before meals. Patient not taking: Reported on 08/30/2015 04/24/15   Ripudeep Krystal Eaton, MD  oxyCODONE-acetaminophen (PERCOCET/ROXICET) 5-325 MG tablet Take 1 tablet by mouth every 6 (six) hours as needed for moderate pain or severe pain. Patient not taking: Reported on 08/30/2015 04/24/15   Ripudeep Krystal Eaton, MD  polyethylene glycol (MIRALAX / GLYCOLAX) packet Take 17 g by mouth daily as needed for mild constipation or moderate constipation. Patient not taking: Reported on 08/30/2015 04/24/15   Ripudeep Krystal Eaton, MD  promethazine (PHENERGAN) 12.5 MG tablet Take 1 tablet (12.5 mg total) by mouth every 6 (six) hours as needed for nausea or vomiting. Patient not taking: Reported on 08/30/2015 04/24/15   Ripudeep K Rai, MD   BP 156/85 mmHg  Pulse 72  Temp(Src) 98.7 F (37.1 C) (Oral)  Resp 18  SpO2 99% Physical Exam  Constitutional: She is oriented to person, place, and time. She appears well-developed.  HENT:  Right Ear: External ear normal.   Left Ear: External ear normal.  Mouth/Throat: Oropharynx is clear and moist. No oropharyngeal exudate.  Eyes: Conjunctivae and EOM are normal.  Neck: Normal range of motion. Neck supple.  Cardiovascular: Normal rate.   Pulmonary/Chest: Effort normal. No respiratory distress. She has no wheezes. She has no rales.  Abdominal: Bowel sounds are normal.  Musculoskeletal: She exhibits no edema.  Lymphadenopathy:    She has no cervical adenopathy.  Neurological: She is alert and oriented to person, place, and time.  Skin: Skin is warm and dry.  Nursing note and vitals reviewed.   ED Course  Procedures (including critical care time) Labs Review Labs Reviewed - No data to display  Imaging Review Dg Chest 2 View  08/30/2015  CLINICAL DATA:  Cough, congestion, fever for 2 days EXAM: CHEST  2 VIEW COMPARISON:  08/26/2015 FINDINGS: The heart size and mediastinal contours are within normal limits. Both lungs are clear. The visualized skeletal structures are unremarkable. IMPRESSION: No active  cardiopulmonary disease. Electronically Signed   By: Rolm Baptise M.D.   On: 08/30/2015 07:23   I have personally reviewed and evaluated these images and lab results as part of my medical decision-making.   EKG Interpretation None      MDM   Final diagnoses:  Chronic cough  Mucopurulent chronic bronchitis (HCC)    DDx:  Bronchitis  Allergies  Asthmatic exacerbation  PNA  PE   Pt presents with worsening cough and associated sputum production, which is different from her baseline chronic cough. She has no signs of infection, risk factors or s/sxs for DVT/PE, or other signs/symptoms consistent with CHF. This is patient's 2nd visit for the same complaint. Pt is concerned about the cough getting worse, and the phlegm turning yellow. We  Discussed the options of f/u with her primary doctors vs. Treating with antibiotics for presumed bronchitis-  And pt has preferred with the wait and see approach with  antibiotics option.  Will d/c.  Varney Biles, MD 08/30/15 1011

## 2015-11-03 DIAGNOSIS — H1013 Acute atopic conjunctivitis, bilateral: Secondary | ICD-10-CM | POA: Diagnosis not present

## 2015-11-03 DIAGNOSIS — H40023 Open angle with borderline findings, high risk, bilateral: Secondary | ICD-10-CM | POA: Diagnosis not present

## 2015-11-03 DIAGNOSIS — H04123 Dry eye syndrome of bilateral lacrimal glands: Secondary | ICD-10-CM | POA: Diagnosis not present

## 2015-11-08 ENCOUNTER — Telehealth: Payer: Self-pay | Admitting: Allergy and Immunology

## 2015-11-08 NOTE — Telephone Encounter (Signed)
Pt called and needs [place] to call in something differ from astepro because she said it cost her $90.00 and she can not afford it. [phone].

## 2015-11-08 NOTE — Telephone Encounter (Signed)
Called patient she advised rx went from 90 to 47 and back up to 90 dollars, I advised patient maybe in donut hole again or can check to see if preferred med cheaper.  She was getting generic

## 2015-11-09 ENCOUNTER — Other Ambulatory Visit: Payer: Self-pay | Admitting: *Deleted

## 2015-11-09 MED ORDER — AZELASTINE HCL 0.1 % NA SOLN: 2.0000 | Freq: Two times a day (BID) | NASAL | Status: DC

## 2015-11-15 DIAGNOSIS — R140 Abdominal distension (gaseous): Secondary | ICD-10-CM | POA: Diagnosis not present

## 2015-11-15 DIAGNOSIS — K5901 Slow transit constipation: Secondary | ICD-10-CM | POA: Diagnosis not present

## 2015-11-17 ENCOUNTER — Ambulatory Visit: Payer: Commercial Managed Care - HMO | Admitting: Allergy and Immunology

## 2015-12-08 ENCOUNTER — Encounter: Payer: Self-pay | Admitting: Allergy and Immunology

## 2015-12-08 ENCOUNTER — Ambulatory Visit (INDEPENDENT_AMBULATORY_CARE_PROVIDER_SITE_OTHER): Payer: Commercial Managed Care - HMO | Admitting: Allergy and Immunology

## 2015-12-08 VITALS — BP 102/70 | HR 80 | Temp 97.9°F | Resp 16

## 2015-12-08 DIAGNOSIS — J309 Allergic rhinitis, unspecified: Secondary | ICD-10-CM

## 2015-12-08 DIAGNOSIS — J4540 Moderate persistent asthma, uncomplicated: Secondary | ICD-10-CM | POA: Diagnosis not present

## 2015-12-08 DIAGNOSIS — H1010 Acute atopic conjunctivitis, unspecified eye: Secondary | ICD-10-CM | POA: Diagnosis not present

## 2015-12-08 NOTE — Patient Instructions (Addendum)
   Use Qnasl 1-2 sprays each morning.  Use Saline wash 2 times daily.  Continue Claritin, Astelin and Symbicort.  Use albuterol neb twice daily for the next several days and as needed every 4 hours for cough/wheeze.  Prednisone '30mg'$  today, '20mg'$  tomorrow and '10mg'$  on last day.  Call with update in the next 5 days.  Epi-pen/Benadryl as needed.  Follow-up with Dr. Nelva Bush in 4 months or sooner if needed.

## 2015-12-08 NOTE — Progress Notes (Signed)
FOLLOW UP NOTE  RE: Angelica [name] MRN: [id number] DOB: 05/12/1941 [facility] [place] [address] [place] [address] Date of Office Visit: 12/08/2015  Subjective:  Angelica [name] is a 75 y.o. female who presents today for Asthma (congested today has had cold for 10days)  Assessment:   1. Moderate persistent asthma, improving from recent flare.  2. Allergic rhinoconjunctivitis.   3.      Likely recent viral upper respiratory infection, improved afebrile in no respiratory distress. 4.      Food allergy--avoidance and emergency action plan in place. Plan:   Meds ordered this encounter  Medications  . SYMBICORT 160-4.5 MCG/ACT inhaler    Sig: Inhale 2 puffs into the lungs 2 (two) times daily.    Dispense:  1 Inhaler    Refill:  3  1.   Use Qnasl 1-2 sprays each morning (sample given). 2.   Use Saline wash 2 times daily. 3.   Continue Claritin, Astelin and Symbicort. 4.   Use albuterol neb twice daily for the next several days and as needed every 4 hours for cough/wheeze. 5.   Prednisone '30mg'$  today, '20mg'$  tomorrow and '10mg'$  on last day. 6.   Call with update in the next 5 days. 7.   Epi-pen/Benadryl as needed. 8.   Follow-up with Dr. Nelva Bush in 4 months or sooner if needed.  HPI: Angelica [name] returns to the office with cough and congestion.  She reports her symptoms began about one week ago and have now improved about 80%.  She used albuterol neb once last week and her ProAir.  She feels her sleep is good and denies difficulty in breathing, shortness of breath, chest tightness, fever, sore throat or headache.  She reports being consistent with her maintenance medications and feels they are beneficial.  She denies nocturnal awakenings or disrupted activity.  No other new medical concerns or questions. Continues to follow with Dr. Carlis Abbott.  Since her last visit here in March, she was seen in the ED for upper respiratory symptoms in April and intermittently  used Mucinex.Tessalon perles and completed Zithromax after normal chest xray.  Denies urgent care visits, prednisone courses. Reports sleep and activity are normal.  Angelica [name] has a current medication list which includes the following prescription(s): acetaminophen, albuterol, azelastine, beclomethasone dipropionate, benzonatate, docusate sodium, guaifenesin, loratadine, losartan-hydrochlorothiazide, omeprazole, probiotic product, and symbicort.   Drug Allergies: Allergies  Allergen Reactions  . Asa Buff (Mag [Buffered Aspirin] Other (See Comments)    Excessive sweating  . Fish Allergy Other (See Comments)    Unknown reaction  . Other Other (See Comments)    Gelcaps; gel-containing capsules; extended release tablets  . Peanut-Containing Drug Products Other (See Comments)    From allergy test  . Penicillins Itching  . Shellfish-Derived Products Other (See Comments)    From allergy test  . Strawberry Extract Other (See Comments)    Worsened allergy symptoms   Objective:   Vitals:   12/08/15 1029  BP: 102/70  Pulse: 80  Resp: 16  Temp: 97.9 F (36.6 C)   SpO2 Readings from Last 1 Encounters:  12/08/15 98%   Physical Exam  Constitutional: She is well-developed, well-nourished, and in no distress.  Non-toxic appearance.  HENT:  Head: Atraumatic.  Right Ear: Tympanic membrane and ear canal normal.  Left Ear: Tympanic membrane and ear canal normal.  Nose: Mucosal edema present. No rhinorrhea. No epistaxis.  Mouth/Throat: Oropharynx is clear and moist and mucous membranes  are normal. No oropharyngeal exudate, posterior oropharyngeal edema or posterior oropharyngeal erythema.  Neck: Neck supple.  Cardiovascular: Normal rate, S1 normal and S2 normal.   No murmur heard. Pulmonary/Chest: Effort normal. No respiratory distress. She has wheezes. She has no rhonchi. She has no rales.  Post Xopenex/Atrovent neb: clear breath sounds throughout without wheeze, rhonchi or crackles, symmetric  aeration.  Patient reports improved.  Lymphadenopathy:    She has no cervical adenopathy.   Diagnostics: Spirometry:  FVC 2.27--- 94%, FEV1 1.74--- 93%, FEF 25-75%.1.31--66%; postbronchodilator improvement  FVC 2.38--- 98%, FEV1 1.89--- 102%, FEF 25-75% 1.65--- 82%.    Hart Haas M. Ishmael Holter, MD  cc: Foye Spurling, MD

## 2015-12-12 MED ORDER — SYMBICORT 160-4.5 MCG/ACT IN AERO: 2.0000 | INHALATION_SPRAY | Freq: Two times a day (BID) | RESPIRATORY_TRACT | 3 refills | Status: DC

## 2015-12-21 DIAGNOSIS — M150 Primary generalized (osteo)arthritis: Secondary | ICD-10-CM | POA: Diagnosis not present

## 2015-12-21 DIAGNOSIS — E559 Vitamin D deficiency, unspecified: Secondary | ICD-10-CM | POA: Diagnosis not present

## 2015-12-21 DIAGNOSIS — E7800 Pure hypercholesterolemia, unspecified: Secondary | ICD-10-CM | POA: Diagnosis not present

## 2015-12-21 DIAGNOSIS — R7302 Impaired glucose tolerance (oral): Secondary | ICD-10-CM | POA: Diagnosis not present

## 2015-12-21 DIAGNOSIS — I10 Essential (primary) hypertension: Secondary | ICD-10-CM | POA: Diagnosis not present

## 2015-12-21 DIAGNOSIS — E781 Pure hyperglyceridemia: Secondary | ICD-10-CM | POA: Diagnosis not present

## 2016-01-27 DIAGNOSIS — H26491 Other secondary cataract, right eye: Secondary | ICD-10-CM | POA: Diagnosis not present

## 2016-01-27 DIAGNOSIS — H33321 Round hole, right eye: Secondary | ICD-10-CM | POA: Diagnosis not present

## 2016-01-27 DIAGNOSIS — H40023 Open angle with borderline findings, high risk, bilateral: Secondary | ICD-10-CM | POA: Diagnosis not present

## 2016-01-27 DIAGNOSIS — Z961 Presence of intraocular lens: Secondary | ICD-10-CM | POA: Diagnosis not present

## 2016-02-07 DIAGNOSIS — H33301 Unspecified retinal break, right eye: Secondary | ICD-10-CM | POA: Diagnosis not present

## 2016-02-07 DIAGNOSIS — H43392 Other vitreous opacities, left eye: Secondary | ICD-10-CM | POA: Diagnosis not present

## 2016-02-07 DIAGNOSIS — H43391 Other vitreous opacities, right eye: Secondary | ICD-10-CM | POA: Diagnosis not present

## 2016-02-07 DIAGNOSIS — Z961 Presence of intraocular lens: Secondary | ICD-10-CM | POA: Diagnosis not present

## 2016-02-07 DIAGNOSIS — H43393 Other vitreous opacities, bilateral: Secondary | ICD-10-CM | POA: Diagnosis not present

## 2016-02-16 DIAGNOSIS — H33301 Unspecified retinal break, right eye: Secondary | ICD-10-CM | POA: Diagnosis not present

## 2016-03-05 ENCOUNTER — Ambulatory Visit: Payer: Commercial Managed Care - HMO | Admitting: Allergy and Immunology

## 2016-03-16 DIAGNOSIS — K219 Gastro-esophageal reflux disease without esophagitis: Secondary | ICD-10-CM | POA: Diagnosis not present

## 2016-03-16 DIAGNOSIS — K5901 Slow transit constipation: Secondary | ICD-10-CM | POA: Diagnosis not present

## 2016-03-20 ENCOUNTER — Encounter (INDEPENDENT_AMBULATORY_CARE_PROVIDER_SITE_OTHER): Payer: Self-pay

## 2016-03-20 ENCOUNTER — Encounter: Payer: Self-pay | Admitting: Allergy and Immunology

## 2016-03-20 ENCOUNTER — Ambulatory Visit: Payer: Commercial Managed Care - HMO | Admitting: Allergy and Immunology

## 2016-03-20 VITALS — BP 126/72 | HR 68 | Temp 98.1°F | Resp 16 | Ht 63.5 in | Wt 158.2 lb

## 2016-03-20 DIAGNOSIS — J3089 Other allergic rhinitis: Secondary | ICD-10-CM | POA: Diagnosis not present

## 2016-03-20 DIAGNOSIS — R05 Cough: Secondary | ICD-10-CM | POA: Diagnosis not present

## 2016-03-20 DIAGNOSIS — J4540 Moderate persistent asthma, uncomplicated: Secondary | ICD-10-CM | POA: Diagnosis not present

## 2016-03-20 DIAGNOSIS — R053 Chronic cough: Secondary | ICD-10-CM | POA: Insufficient documentation

## 2016-03-20 MED ORDER — IPRATROPIUM BROMIDE 0.06 % NA SOLN: 1.0000 | Freq: Three times a day (TID) | NASAL | 5 refills | Status: DC

## 2016-03-20 NOTE — Assessment & Plan Note (Signed)
The patient's history and physical examination suggest upper airway cough syndrome.  Spirometry today reveals normal ventilatory function. We will aggressively treat postnasal drainage and evaluate results.  Treatment plan as outlined above.  If the coughing persists or progresses despite this plan, we will evaluate further. 

## 2016-03-20 NOTE — Assessment & Plan Note (Signed)
   Continue appropriate allergen avoidance measures.  A prescription has been provided for ipratropium 0.06% nasal spray, one spray per nostril every 8 hours as needed.  Nasal saline lavage (NeilMed) as needed has been recommended along with instructions for proper administration.  For thick post nasal drainage, add guaifenesin [phone] mg (Mucinex)  twice daily as needed with adequate hydration as discussed.

## 2016-03-20 NOTE — Patient Instructions (Addendum)
Moderate persistent asthma Well-controlled.  For now, continue Symbicort 160/4.5 g, 2 inhalations via spacer device twice a day, and albuterol HFA, 1-2 inhalations every 4-6 hours as needed.  To maximize pulmonary deposition, a spacer has been provided along with instructions for its proper administration with an HFA inhaler.  Subjective and objective measures of pulmonary function will be followed and the treatment plan will be adjusted accordingly.  Other allergic rhinitis  Continue appropriate allergen avoidance measures.  A prescription has been provided for ipratropium 0.06% nasal spray, one spray per nostril every 8 hours as needed.  Nasal saline lavage (NeilMed) as needed has been recommended along with instructions for proper administration.  For thick post nasal drainage, add guaifenesin [phone] mg (Mucinex)  twice daily as needed with adequate hydration as discussed.  Cough, persistent The patient's history and physical examination suggest upper airway cough syndrome.  Spirometry today reveals normal ventilatory function. We will aggressively treat postnasal drainage and evaluate results.  Treatment plan as outlined above.  If the coughing persists or progresses despite this plan, we will evaluate further.   Return in about 4 months (around 07/18/2016), or if symptoms worsen or fail to improve.

## 2016-03-20 NOTE — Assessment & Plan Note (Signed)
Well-controlled.  For now, continue Symbicort 160/4.5 g, 2 inhalations via spacer device twice a day, and albuterol HFA, 1-2 inhalations every 4-6 hours as needed.  To maximize pulmonary deposition, a spacer has been provided along with instructions for its proper administration with an HFA inhaler.  Subjective and objective measures of pulmonary function will be followed and the treatment plan will be adjusted accordingly.

## 2016-03-20 NOTE — Progress Notes (Signed)
Follow-up Note  RE: Angelica [name] MRN: [id number] DOB: 1940-11-13 Date of Office Visit: 03/20/2016  Primary care provider: Foye Spurling, MD Referring provider: Foye Spurling, MD  History of present illness: Angelica [name] is a 75 y.o. female with persistent asthma, allergic rhinoconjunctivitis, and food allergies presenting today for follow up.  She was last seen in this clinic on 12/08/2015 by Dr. Ishmael Holter who has since left the practice.  She complains of increased thick postnasal drainage, frequent throat clearing, and a persistent cough.  The cough originates as a tickle at the base of the throat and seems to coincide with thick postnasal drainage.  She believes that these symptoms have been triggered/exacerbated by the recent weather changes.  She has no complaints of heartburn today. She currently uses azelastine nasal spray as needed, Symbicort 160/4.5 g, 2 inhalations twice a day without a spacer device, and albuterol as needed.     Assessment and plan: Moderate persistent asthma Well-controlled.  For now, continue Symbicort 160/4.5 g, 2 inhalations via spacer device twice a day, and albuterol HFA, 1-2 inhalations every 4-6 hours as needed.  To maximize pulmonary deposition, a spacer has been provided along with instructions for its proper administration with an HFA inhaler.  Subjective and objective measures of pulmonary function will be followed and the treatment plan will be adjusted accordingly.  Other allergic rhinitis  Continue appropriate allergen avoidance measures.  A prescription has been provided for ipratropium 0.06% nasal spray, one spray per nostril every 8 hours as needed.  Nasal saline lavage (NeilMed) as needed has been recommended along with instructions for proper administration.  For thick post nasal drainage, add guaifenesin [phone] mg (Mucinex)  twice daily as needed with adequate hydration as discussed.  Cough, persistent The patient's  history and physical examination suggest upper airway cough syndrome.  Spirometry today reveals normal ventilatory function. We will aggressively treat postnasal drainage and evaluate results.  Treatment plan as outlined above.  If the coughing persists or progresses despite this plan, we will evaluate further.   Meds ordered this encounter  Medications  . ipratropium (ATROVENT) 0.06 % nasal spray    Sig: Place 1 spray into both nostrils 3 (three) times daily.    Dispense:  15 mL    Refill:  5    Diagnostics: Spirometry:  Normal with an FEV1 of 95% predicted.  Please see scanned spirometry results for details.    Physical examination: Blood pressure 126/72, pulse 68, temperature 98.1 F (36.7 C), temperature source Oral, resp. rate 16, height 5' 3.5" (1.613 m), weight 158 lb 3.2 oz (71.8 kg), SpO2 97 %.  General: Alert, interactive, in no acute distress. HEENT: TMs pearly gray, turbinates moderately edematous with thick discharge, post-pharynx erythematous. Neck: Supple without lymphadenopathy. Lungs: Clear to auscultation without wheezing, rhonchi or rales. CV: Normal S1, S2 without murmurs. Skin: Warm and dry, without lesions or rashes.  The following portions of the patient's history were reviewed and updated as appropriate: allergies, current medications, past family history, past medical history, past social history, past surgical history and problem list.    Medication List       Accurate as of 03/20/16  1:30 PM. Always use your most recent med list.          acetaminophen 500 MG tablet Commonly known as:  TYLENOL Take 500 mg by mouth daily as needed for mild pain, moderate pain, fever or headache. For pain   albuterol (2.5 MG/3ML) 0.083% nebulizer solution Commonly  known as:  PROVENTIL Take 2.5 mg by nebulization 2 (two) times daily as needed for wheezing or shortness of breath.   azelastine 0.1 % nasal spray Commonly known as:  ASTELIN Place 2 sprays into  both nostrils 2 (two) times daily.   benzonatate 100 MG capsule Commonly known as:  TESSALON Take 1 capsule (100 mg total) by mouth every 8 (eight) hours.   docusate sodium 100 MG capsule Commonly known as:  COLACE Take 1 capsule (100 mg total) by mouth 2 (two) times daily. For constipation   guaiFENesin 600 MG 12 hr tablet Commonly known as:  MUCINEX Take 600 mg by mouth daily as needed for cough or to loosen phlegm.   ipratropium 0.06 % nasal spray Commonly known as:  ATROVENT Place 1 spray into both nostrils 3 (three) times daily.   loratadine 10 MG tablet Commonly known as:  CLARITIN Take 10 mg by mouth daily as needed. For allergy symptoms   losartan-hydrochlorothiazide 100-12.5 MG tablet Commonly known as:  HYZAAR Take 1 tablet by mouth daily.   omeprazole 40 MG capsule Commonly known as:  PRILOSEC Take 40 mg by mouth daily as needed. For acid reflux   PROBIOTIC PO Take 1 capsule by mouth daily.   QNASL 80 MCG/ACT Aers Generic drug:  Beclomethasone Dipropionate Place 1 spray into the nose as needed (congestion).   SYMBICORT 160-4.5 MCG/ACT inhaler Generic drug:  budesonide-formoterol Inhale 2 puffs into the lungs 2 (two) times daily.   VITAMIN D3 PO Take 1,000 mg by mouth daily at 12 noon.       Allergies  Allergen Reactions  . Asa Buff (Mag [Buffered Aspirin] Other (See Comments)    Excessive sweating  . Fish Allergy Other (See Comments)    Unknown reaction  . Other Other (See Comments)    Gelcaps; gel-containing capsules; extended release tablets  . Peanut-Containing Drug Products Other (See Comments)    From allergy test  . Penicillins Itching    Has patient had a PCN reaction causing immediate rash, facial/tongue/throat swelling, SOB or lightheadedness with hypotension: No Has patient had a PCN reaction causing severe rash involving mucus membranes or skin necrosis: No Has patient had a PCN reaction that required hospitalization No Has patient  had a PCN reaction occurring within the last 10 years: No If all of the above answers are "NO", then may proceed with Cephalosporin use.  . Shellfish-Derived Products Other (See Comments)    From allergy test  . Strawberry Extract Other (See Comments)    Worsened allergy symptoms   Review of systems: Review of systems negative except as noted in HPI / PMHx or noted below: Constitutional: Negative.  HENT: Negative.   Eyes: Negative.  Respiratory: Negative.   Cardiovascular: Negative.  Gastrointestinal: Negative.  Genitourinary: Negative.  Musculoskeletal: Negative.  Neurological: Negative.  Endo/Heme/Allergies: Negative.  Cutaneous: Negative.  Past Medical History:  Diagnosis Date  . Asthma   . Bowel obstruction   . Environmental allergies   . GERD (gastroesophageal reflux disease)   . H. pylori infection   . Hypertension     Family History  Problem Relation Age of Onset  . Allergic rhinitis Neg Hx   . Angioedema Neg Hx   . Asthma Neg Hx   . Eczema Neg Hx   . Immunodeficiency Neg Hx   . Urticaria Neg Hx     Social History   Social History  . Marital status: Widowed    Spouse name: N/A  . Number of  children: N/A  . Years of education: N/A   Occupational History  . Not on file.   Social History Main Topics  . Smoking status: Former Smoker    Types: Cigarettes  . Smokeless tobacco: Never Used  . Alcohol use No  . Drug use: No  . Sexual activity: No   Other Topics Concern  . Not on file   Social History Narrative  . No narrative on file   I appreciate the opportunity to take part in Kynzlee's care. Please do not hesitate to contact me with questions.  Sincerely,   R. Edgar Frisk, MD

## 2016-04-24 DIAGNOSIS — J0101 Acute recurrent maxillary sinusitis: Secondary | ICD-10-CM | POA: Diagnosis not present

## 2016-04-24 DIAGNOSIS — I10 Essential (primary) hypertension: Secondary | ICD-10-CM | POA: Diagnosis not present

## 2016-04-24 DIAGNOSIS — M150 Primary generalized (osteo)arthritis: Secondary | ICD-10-CM | POA: Diagnosis not present

## 2016-04-24 DIAGNOSIS — E7800 Pure hypercholesterolemia, unspecified: Secondary | ICD-10-CM | POA: Diagnosis not present

## 2016-06-08 ENCOUNTER — Encounter (HOSPITAL_COMMUNITY): Payer: Self-pay | Admitting: Emergency Medicine

## 2016-06-08 ENCOUNTER — Emergency Department (HOSPITAL_COMMUNITY): Payer: Medicare HMO

## 2016-06-08 ENCOUNTER — Emergency Department (HOSPITAL_COMMUNITY)
Admission: EM | Admit: 2016-06-08 | Discharge: 2016-06-08 | Disposition: A | Discharge status: Home / Self Care | Payer: Medicare HMO | Attending: Physician Assistant | Admitting: Physician Assistant

## 2016-06-08 DIAGNOSIS — Z91010 Allergy to peanuts: Secondary | ICD-10-CM | POA: Diagnosis not present

## 2016-06-08 DIAGNOSIS — J40 Bronchitis, not specified as acute or chronic: Secondary | ICD-10-CM | POA: Insufficient documentation

## 2016-06-08 DIAGNOSIS — Z79899 Other long term (current) drug therapy: Secondary | ICD-10-CM | POA: Diagnosis not present

## 2016-06-08 DIAGNOSIS — F17210 Nicotine dependence, cigarettes, uncomplicated: Secondary | ICD-10-CM | POA: Diagnosis not present

## 2016-06-08 DIAGNOSIS — Z87891 Personal history of nicotine dependence: Secondary | ICD-10-CM | POA: Insufficient documentation

## 2016-06-08 DIAGNOSIS — J019 Acute sinusitis, unspecified: Secondary | ICD-10-CM | POA: Insufficient documentation

## 2016-06-08 DIAGNOSIS — R05 Cough: Secondary | ICD-10-CM | POA: Diagnosis not present

## 2016-06-08 DIAGNOSIS — J069 Acute upper respiratory infection, unspecified: Secondary | ICD-10-CM | POA: Diagnosis present

## 2016-06-08 DIAGNOSIS — I10 Essential (primary) hypertension: Secondary | ICD-10-CM | POA: Insufficient documentation

## 2016-06-08 LAB — URINALYSIS, ROUTINE W REFLEX MICROSCOPIC
Bilirubin Urine: NEGATIVE
Glucose, UA: NEGATIVE mg/dL
Hgb urine dipstick: NEGATIVE
Ketones, ur: NEGATIVE mg/dL
Leukocytes, UA: NEGATIVE
Nitrite: NEGATIVE
Protein, ur: NEGATIVE mg/dL
Specific Gravity, Urine: 1.003 — ABNORMAL LOW (ref 1.005–1.030)
pH: 7 (ref 5.0–8.0)

## 2016-06-08 LAB — CBC WITH DIFFERENTIAL/PLATELET
Basophils Absolute: 0 10*3/uL (ref 0.0–0.1)
Basophils Relative: 1 %
Eosinophils Absolute: 0.6 10*3/uL (ref 0.0–0.7)
Eosinophils Relative: 8 %
HCT: 38.8 % (ref 36.0–46.0)
Hemoglobin: 13.4 g/dL (ref 12.0–15.0)
Lymphocytes Relative: 36 %
Lymphs Abs: 2.8 10*3/uL (ref 0.7–4.0)
MCH: 30.7 pg (ref 26.0–34.0)
MCHC: 34.5 g/dL (ref 30.0–36.0)
MCV: 89 fL (ref 78.0–100.0)
Monocytes Absolute: 0.5 10*3/uL (ref 0.1–1.0)
Monocytes Relative: 6 %
Neutro Abs: 3.9 10*3/uL (ref 1.7–7.7)
Neutrophils Relative %: 49 %
Platelets: 250 10*3/uL (ref 150–400)
RBC: 4.36 MIL/uL (ref 3.87–5.11)
RDW: 14.3 % (ref 11.5–15.5)
WBC: 7.8 10*3/uL (ref 4.0–10.5)

## 2016-06-08 LAB — BASIC METABOLIC PANEL
Anion gap: 10 (ref 5–15)
BUN: 8 mg/dL (ref 6–20)
CO2: 24 mmol/L (ref 22–32)
Calcium: 9.6 mg/dL (ref 8.9–10.3)
Chloride: 104 mmol/L (ref 101–111)
Creatinine, Ser: 0.84 mg/dL (ref 0.44–1.00)
GFR calc Af Amer: >60 mL/min (ref >60)
GFR calc non Af Amer: >60 mL/min (ref >60)
Glucose, Bld: 90 mg/dL (ref 65–99)
Potassium: 3.6 mmol/L (ref 3.5–5.1)
Sodium: 138 mmol/L (ref 135–145)

## 2016-06-08 IMAGING — CR DG CHEST 2V
2 series · 2 of 2 positions shown · non-contrast
Comparison: [DATE]

CLINICAL DATA: Productive cough and chest congestion.

EXAM:
CHEST  2 VIEW

[chest pa]
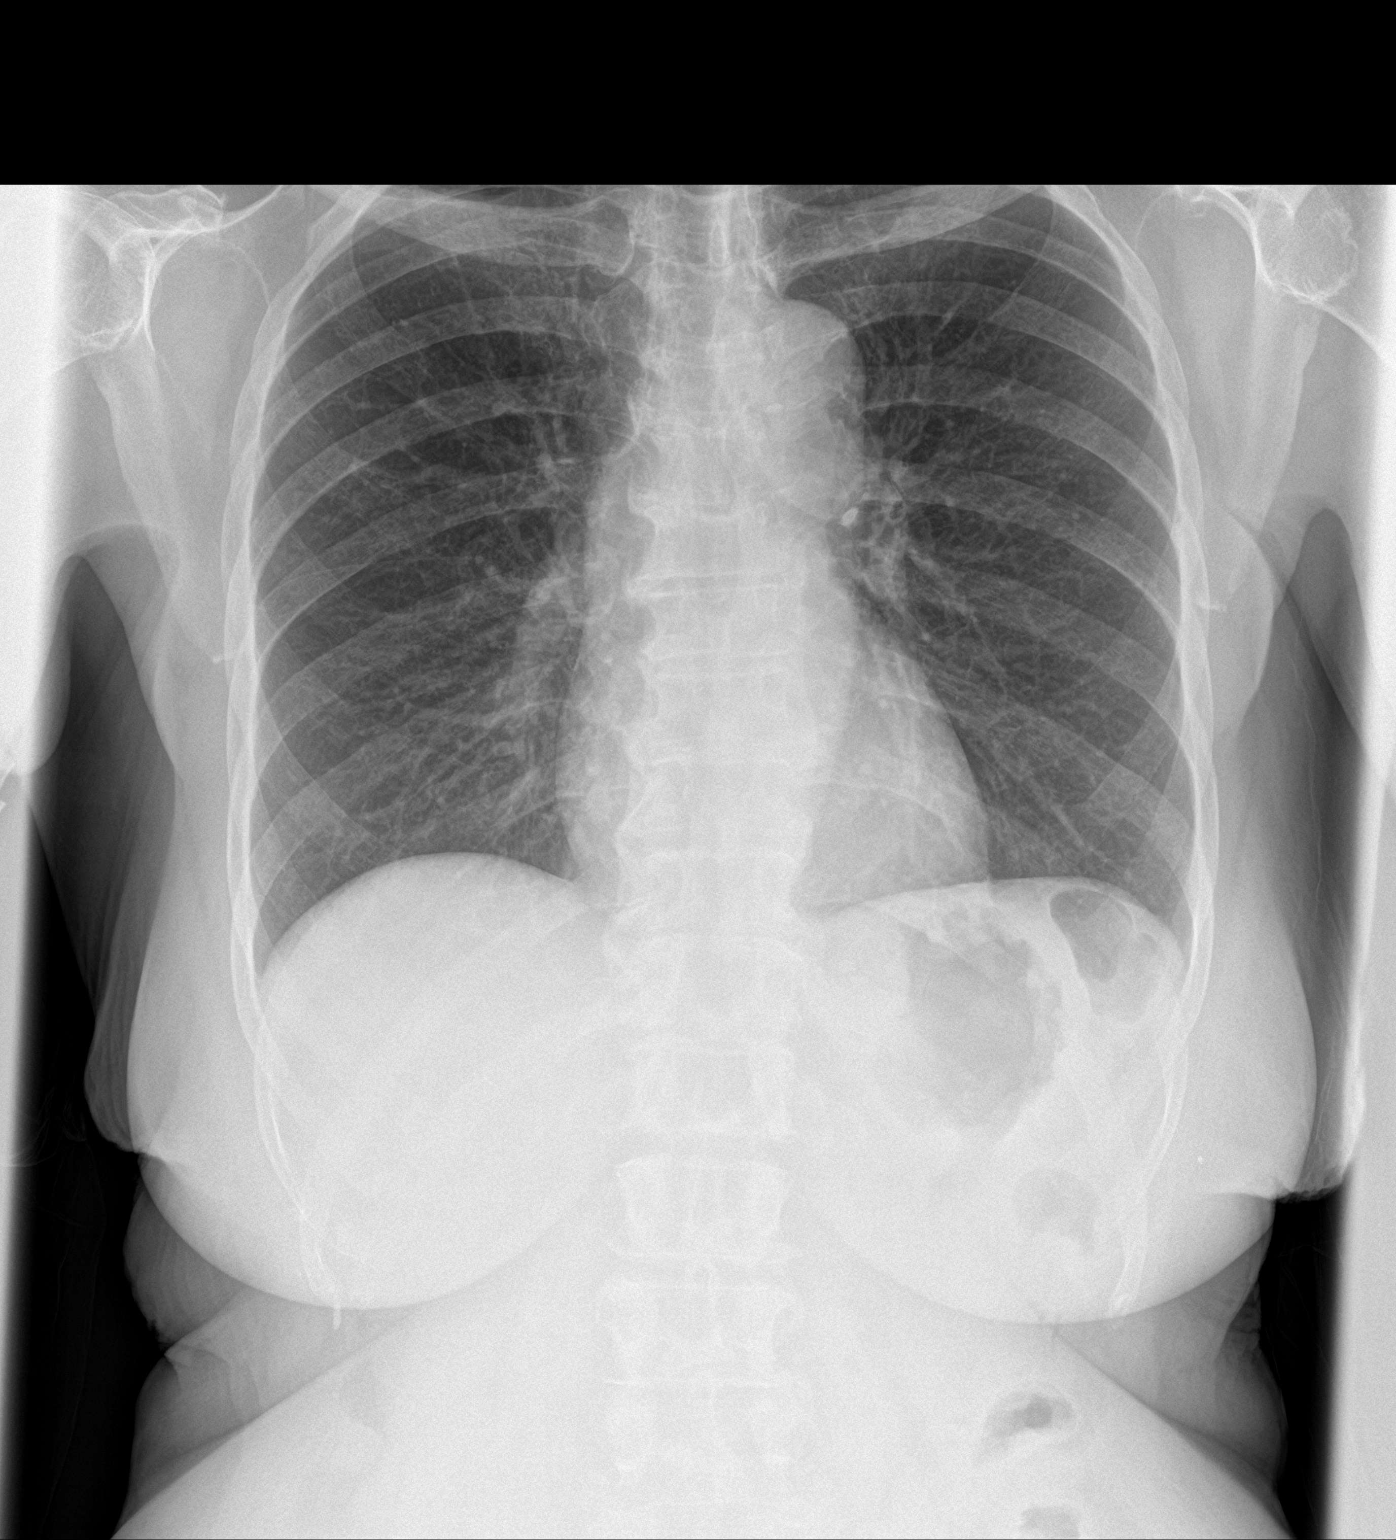

[chest lat]
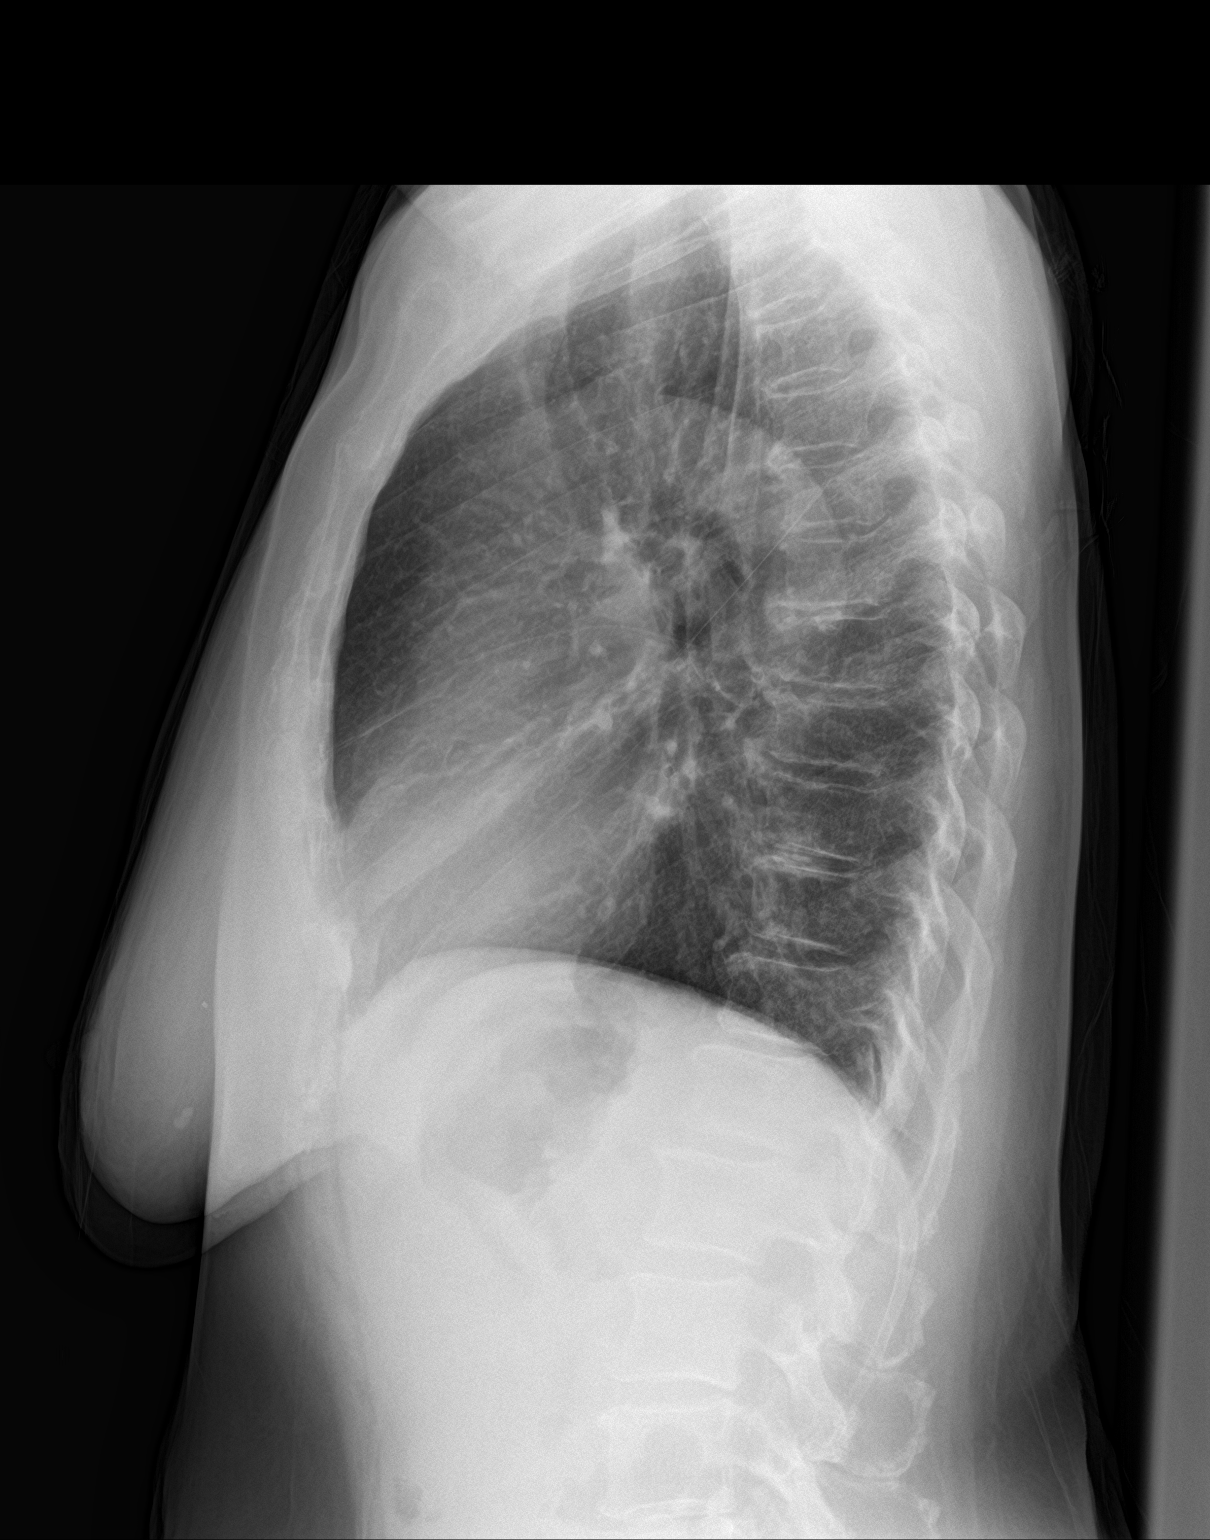

[2 of 2 positions shown; findings below may reference images not displayed]

FINDINGS: The heart size and mediastinal contours are within normal limits.
Both lungs are clear. The visualized skeletal structures are
unremarkable. Multiple tiny surgical clips in the right side of the
neck consistent with prior thyroid or parathyroid surgery.
IMPRESSION: No active cardiopulmonary disease.

## 2016-06-08 MED ORDER — PREDNISONE 50 MG PO TABS: 50.0000 mg | ORAL_TABLET | Freq: Every day | ORAL | 0 refills | Status: DC

## 2016-06-08 MED ORDER — PROMETHAZINE-DM 6.25-15 MG/5ML PO SYRP: 5.0000 mL | ORAL_SOLUTION | Freq: Four times a day (QID) | ORAL | 0 refills | Status: DC | PRN

## 2016-06-08 MED ORDER — DOXYCYCLINE HYCLATE 100 MG PO CAPS: 100.0000 mg | ORAL_CAPSULE | Freq: Two times a day (BID) | ORAL | 0 refills | Status: DC

## 2016-06-08 MED ORDER — PREDNISONE 20 MG PO TABS
60.0000 mg | ORAL_TABLET | Freq: Once | ORAL | Status: AC
  Administered 2016-06-08: 60 mg via ORAL
  Filled 2016-06-08: qty 3

## 2016-06-08 MED ORDER — GUAIFENESIN ER 1200 MG PO TB12: 1.0000 | ORAL_TABLET | Freq: Two times a day (BID) | ORAL | 0 refills | Status: DC

## 2016-06-08 MED ORDER — IPRATROPIUM BROMIDE 0.02 % IN SOLN
0.5000 mg | RESPIRATORY_TRACT | Status: AC
Start: 2016-06-08 — End: 2016-06-08
  Administered 2016-06-08: 0.5 mg via RESPIRATORY_TRACT
  Filled 2016-06-08: qty 2.5

## 2016-06-08 MED ORDER — ALBUTEROL SULFATE (2.5 MG/3ML) 0.083% IN NEBU
5.0000 mg | INHALATION_SOLUTION | Freq: Once | RESPIRATORY_TRACT | Status: AC
  Administered 2016-06-08: 5 mg via RESPIRATORY_TRACT
  Filled 2016-06-08: qty 6

## 2016-06-08 NOTE — ED Notes (Signed)
Pt transporting to xray. NAD.

## 2016-06-08 NOTE — Discharge Instructions (Signed)
Return here for any worsening in your condition.  Follow-up with her primary care Dr. for recheck

## 2016-06-08 NOTE — ED Provider Notes (Signed)
[facility] DEPT Provider Note   CSN: [id number] Arrival date & time: 06/08/16  9449     History   Chief Complaint Chief Complaint  Patient presents with  . URI    HPI Angelica [name] is a 76 y.o. female.  HPI Patient presents to the emergency department with productive cough and congestion over the last month but worse over the last week.  The patient states that she has had a little bit of wheezing associated with it.  She states she also saw her primary care doctor over the last couple weeks. The patient denies chest pain, shortness of breath, headache,blurred vision, neck pain, fever, weakness, numbness, dizziness, anorexia, edema, abdominal pain, nausea, vomiting, diarrhea, rash, back pain, dysuria, hematemesis, bloody stool, near syncope, or syncope.  Past Medical History:  Diagnosis Date  . Asthma   . Bowel obstruction   . Environmental allergies   . GERD (gastroesophageal reflux disease)   . H. pylori infection   . Hypertension     Patient Active Problem List   Diagnosis Date Noted  . Moderate persistent asthma 03/20/2016  . Other allergic rhinitis 03/20/2016  . Cough, persistent 03/20/2016  . Hypoglycemia 04/17/2015  . Small bowel obstruction   . Generalized abdominal pain   . Abdominal pain   . Malnutrition of moderate degree 04/11/2015  . SBO (small bowel obstruction) 04/10/2015  . HTN (hypertension) 04/10/2015    Past Surgical History:  Procedure Laterality Date  . ABDOMINAL HYSTERECTOMY    . COLON SURGERY    . KNEE SURGERY    . LAPAROTOMY N/A 04/18/2015   Procedure: Exploratory laparotomy with lysis of adhesions, possible bowel resection;  Surgeon: Ralene Ok, MD;  Location: [facility];  Service: General;  Laterality: N/A;  . NASAL SINUS SURGERY    . THYROID SURGERY      OB History    No data available       Home Medications    Prior to Admission medications   Medication Sig Start Date End Date Taking? Authorizing Provider    acetaminophen (TYLENOL) 500 MG tablet Take 500 mg by mouth daily as needed for mild pain, moderate pain, fever or headache. For pain    Historical Provider, MD  albuterol (PROVENTIL) (2.5 MG/3ML) 0.083% nebulizer solution Take 2.5 mg by nebulization 2 (two) times daily as needed for wheezing or shortness of breath.     Historical Provider, MD  azelastine (ASTELIN) 0.1 % nasal spray Place 2 sprays into both nostrils 2 (two) times daily. 11/09/15   Roselyn Malachy Moan, MD  Beclomethasone Dipropionate (QNASL) 80 MCG/ACT AERS Place 1 spray into the nose as needed (congestion).    Historical Provider, MD  benzonatate (TESSALON) 100 MG capsule Take 1 capsule (100 mg total) by mouth every 8 (eight) hours. Patient not taking: Reported on 03/20/2016 08/30/15   Varney Biles, MD  Cholecalciferol (VITAMIN D3 PO) Take 1,000 mg by mouth daily at 12 noon.    Historical Provider, MD  docusate sodium (COLACE) 100 MG capsule Take 1 capsule (100 mg total) by mouth 2 (two) times daily. For constipation Patient not taking: Reported on 03/20/2016 04/24/15   Ripudeep Krystal Eaton, MD  guaiFENesin (MUCINEX) 600 MG 12 hr tablet Take 600 mg by mouth daily as needed for cough or to loosen phlegm.    Historical Provider, MD  ipratropium (ATROVENT) 0.06 % nasal spray Place 1 spray into both nostrils 3 (three) times daily. 03/20/16   Adelina Mings, MD  loratadine (CLARITIN) 10  MG tablet Take 10 mg by mouth daily as needed. For allergy symptoms    Historical Provider, MD  losartan-hydrochlorothiazide (HYZAAR) 100-12.5 MG per tablet Take 1 tablet by mouth daily.    Historical Provider, MD  omeprazole (PRILOSEC) 40 MG capsule Take 40 mg by mouth daily as needed. For acid reflux    Historical Provider, MD  Probiotic Product (PROBIOTIC PO) Take 1 capsule by mouth daily.    Historical Provider, MD  SYMBICORT 160-4.5 MCG/ACT inhaler Inhale 2 puffs into the lungs 2 (two) times daily. 12/12/15   Roselyn Malachy Moan, MD    Family History Family  History  Problem Relation Age of Onset  . Allergic rhinitis Neg Hx   . Angioedema Neg Hx   . Asthma Neg Hx   . Eczema Neg Hx   . Immunodeficiency Neg Hx   . Urticaria Neg Hx     Social History Social History  Substance Use Topics  . Smoking status: Former Smoker    Types: Cigarettes  . Smokeless tobacco: Never Used  . Alcohol use No     Allergies   Asa buff (mag [buffered aspirin]; Fish allergy; Other; Peanut-containing drug products; Penicillins; Shellfish-derived products; and Strawberry extract   Review of Systems Review of Systems  All other systems negative except as documented in the HPI. All pertinent positives and negatives as reviewed in the HPI. Physical Exam Updated Vital Signs BP 118/70   Pulse 75   Temp 97.7 F (36.5 C) (Oral)   Resp 18   SpO2 97%   Physical Exam  Constitutional: She is oriented to person, place, and time. She appears well-developed and well-nourished. No distress.  HENT:  Head: Normocephalic and atraumatic.  Mouth/Throat: Oropharynx is clear and moist.  Eyes: Pupils are equal, round, and reactive to light.  Neck: Normal range of motion. Neck supple.  Cardiovascular: Normal rate, regular rhythm and normal heart sounds.  Exam reveals no gallop and no friction rub.   No murmur heard. Pulmonary/Chest: Effort normal. No respiratory distress. She has wheezes. She has no rales.  Abdominal: Soft. Bowel sounds are normal. She exhibits no distension. There is no tenderness.  Neurological: She is alert and oriented to person, place, and time. She exhibits normal muscle tone. Coordination normal.  Skin: Skin is warm and dry. Capillary refill takes less than 2 seconds. No rash noted. No erythema.  Psychiatric: She has a normal mood and affect. Her behavior is normal.  Nursing note and vitals reviewed.    ED Treatments / Results  Labs (all labs ordered are listed, but only abnormal results are displayed) Labs Reviewed  BASIC METABOLIC PANEL    CBC WITH DIFFERENTIAL/PLATELET  URINALYSIS, ROUTINE W REFLEX MICROSCOPIC    EKG  EKG Interpretation None       Radiology No results found.  Procedures Procedures (including critical care time)  Medications Ordered in ED Medications - No data to display   Initial Impression / Assessment and Plan / ED Course  I have reviewed the triage vital signs and the nursing notes.  Pertinent labs & imaging results that were available during my care of the patient were reviewed by me and considered in my medical decision making (see chart for details).    Patient given breathing treatment and is feeling some better.  We will treat for this with antibiotics, along with steroids.  Patient is advised to follow-up with her primary care Dr. told to return here as needed.  Patient agrees the plan.  All  questions were answered.  Vital signs remained stable.  She did not become hypoxic  Final Clinical Impressions(s) / ED Diagnoses   Final diagnoses:  None    New Prescriptions New Prescriptions   No medications on file     Dalia Heading, PA-C 06/10/16 Richmond Mackuen, MD 06/11/16 1954

## 2016-06-08 NOTE — ED Triage Notes (Signed)
Pt to ER by private vehicle for evaluation of productive cough and congestion x 1 month. Was seen by PCP, given scripts, took the course and reports no relief. Hx of asthma. NAD. VSS.

## 2016-06-15 ENCOUNTER — Other Ambulatory Visit: Payer: Self-pay | Admitting: Internal Medicine

## 2016-06-15 DIAGNOSIS — Z1231 Encounter for screening mammogram for malignant neoplasm of breast: Secondary | ICD-10-CM

## 2016-06-21 DIAGNOSIS — M150 Primary generalized (osteo)arthritis: Secondary | ICD-10-CM | POA: Diagnosis not present

## 2016-06-21 DIAGNOSIS — I10 Essential (primary) hypertension: Secondary | ICD-10-CM | POA: Diagnosis not present

## 2016-06-21 DIAGNOSIS — J40 Bronchitis, not specified as acute or chronic: Secondary | ICD-10-CM | POA: Diagnosis not present

## 2016-06-21 DIAGNOSIS — E7800 Pure hypercholesterolemia, unspecified: Secondary | ICD-10-CM | POA: Diagnosis not present

## 2016-07-05 ENCOUNTER — Telehealth: Payer: Self-pay | Admitting: Allergy and Immunology

## 2016-07-05 MED ORDER — ALBUTEROL SULFATE (2.5 MG/3ML) 0.083% IN NEBU: 2.5000 mg | INHALATION_SOLUTION | Freq: Two times a day (BID) | RESPIRATORY_TRACT | 0 refills | Status: DC | PRN

## 2016-07-05 NOTE — Telephone Encounter (Signed)
Albuterol neb sent to pharmacy.

## 2016-07-05 NOTE — Telephone Encounter (Signed)
Pt called and needs to have albuterol neb solution called into [facility] [address] [phone].

## 2016-07-19 DIAGNOSIS — H43393 Other vitreous opacities, bilateral: Secondary | ICD-10-CM | POA: Diagnosis not present

## 2016-07-19 DIAGNOSIS — H43392 Other vitreous opacities, left eye: Secondary | ICD-10-CM | POA: Diagnosis not present

## 2016-07-19 DIAGNOSIS — H33301 Unspecified retinal break, right eye: Secondary | ICD-10-CM | POA: Diagnosis not present

## 2016-07-19 DIAGNOSIS — H43391 Other vitreous opacities, right eye: Secondary | ICD-10-CM | POA: Diagnosis not present

## 2016-07-23 ENCOUNTER — Ambulatory Visit: Payer: Commercial Managed Care - HMO | Admitting: Allergy and Immunology

## 2016-07-24 ENCOUNTER — Ambulatory Visit: Payer: Commercial Managed Care - HMO | Admitting: Allergy and Immunology

## 2016-07-24 DIAGNOSIS — I10 Essential (primary) hypertension: Secondary | ICD-10-CM | POA: Diagnosis not present

## 2016-07-24 DIAGNOSIS — E7800 Pure hypercholesterolemia, unspecified: Secondary | ICD-10-CM | POA: Diagnosis not present

## 2016-07-24 DIAGNOSIS — M150 Primary generalized (osteo)arthritis: Secondary | ICD-10-CM | POA: Diagnosis not present

## 2016-07-24 DIAGNOSIS — N390 Urinary tract infection, site not specified: Secondary | ICD-10-CM | POA: Diagnosis not present

## 2016-08-02 DIAGNOSIS — H04123 Dry eye syndrome of bilateral lacrimal glands: Secondary | ICD-10-CM | POA: Diagnosis not present

## 2016-08-02 DIAGNOSIS — H40023 Open angle with borderline findings, high risk, bilateral: Secondary | ICD-10-CM | POA: Diagnosis not present

## 2016-08-16 ENCOUNTER — Ambulatory Visit
Admission: RE | Admit: 2016-08-16 | Discharge: 2016-08-16 | Disposition: A | Discharge status: Home / Self Care | Payer: Medicare HMO | Source: Ambulatory Visit | Attending: Internal Medicine | Admitting: Internal Medicine

## 2016-08-16 DIAGNOSIS — Z1231 Encounter for screening mammogram for malignant neoplasm of breast: Secondary | ICD-10-CM

## 2016-08-20 DIAGNOSIS — M150 Primary generalized (osteo)arthritis: Secondary | ICD-10-CM | POA: Diagnosis not present

## 2016-08-20 DIAGNOSIS — I10 Essential (primary) hypertension: Secondary | ICD-10-CM | POA: Diagnosis not present

## 2016-08-20 DIAGNOSIS — E7800 Pure hypercholesterolemia, unspecified: Secondary | ICD-10-CM | POA: Diagnosis not present

## 2016-08-22 ENCOUNTER — Encounter (HOSPITAL_COMMUNITY): Payer: Self-pay | Admitting: Emergency Medicine

## 2016-08-22 ENCOUNTER — Emergency Department (HOSPITAL_COMMUNITY)
Admission: EM | Admit: 2016-08-22 | Discharge: 2016-08-22 | Disposition: A | Discharge status: Home / Self Care | Payer: Medicare HMO | Attending: Emergency Medicine | Admitting: Emergency Medicine

## 2016-08-22 ENCOUNTER — Emergency Department (HOSPITAL_BASED_OUTPATIENT_CLINIC_OR_DEPARTMENT_OTHER): Admit: 2016-08-22 | Discharge: 2016-08-22 | Disposition: A | Discharge status: Home / Self Care | Payer: Medicare HMO

## 2016-08-22 DIAGNOSIS — M79609 Pain in unspecified limb: Secondary | ICD-10-CM

## 2016-08-22 DIAGNOSIS — Z91010 Allergy to peanuts: Secondary | ICD-10-CM | POA: Insufficient documentation

## 2016-08-22 DIAGNOSIS — J45909 Unspecified asthma, uncomplicated: Secondary | ICD-10-CM | POA: Diagnosis not present

## 2016-08-22 DIAGNOSIS — Z87891 Personal history of nicotine dependence: Secondary | ICD-10-CM | POA: Diagnosis not present

## 2016-08-22 DIAGNOSIS — M79604 Pain in right leg: Secondary | ICD-10-CM | POA: Diagnosis not present

## 2016-08-22 DIAGNOSIS — T672XXA Heat cramp, initial encounter: Secondary | ICD-10-CM | POA: Diagnosis not present

## 2016-08-22 DIAGNOSIS — I10 Essential (primary) hypertension: Secondary | ICD-10-CM | POA: Insufficient documentation

## 2016-08-22 DIAGNOSIS — M79661 Pain in right lower leg: Secondary | ICD-10-CM | POA: Diagnosis not present

## 2016-08-22 LAB — I-STAT CHEM 8, ED
BUN: 16 mg/dL (ref 6–20)
Calcium, Ion: 1.12 mmol/L — ABNORMAL LOW (ref 1.15–1.40)
Chloride: 105 mmol/L (ref 101–111)
Creatinine, Ser: 0.9 mg/dL (ref 0.44–1.00)
Glucose, Bld: 91 mg/dL (ref 65–99)
HCT: 39 % (ref 36.0–46.0)
Hemoglobin: 13.3 g/dL (ref 12.0–15.0)
Potassium: 3.4 mmol/L — ABNORMAL LOW (ref 3.5–5.1)
Sodium: 139 mmol/L (ref 135–145)
TCO2: 25 mmol/L (ref 0–100)

## 2016-08-22 NOTE — ED Triage Notes (Addendum)
Pt arrives via [facility] from home for c/o lower anterior leg cramping, with hx of the same, ems reports pain relieved with massage,exercise and heat. Pt denies any other symptoms.

## 2016-08-22 NOTE — ED Provider Notes (Signed)
[facility] DEPT Provider Note   CSN: [id number] Arrival date & time: 08/22/16  1624     History   Chief Complaint Chief Complaint  Patient presents with  . Leg Pain    HPI Angelica [name] is a 76 y.o. female.  Patient presents to the emergency department with chief complaint of right lower extremity pain. She has a history of varicose pains. She states that she had surgery to remove her varicose veins in the past. She states that she has had some pain in the back of her calf. She denies any chest pain, shortness of breath, or fevers. She denies any injury to the area. She has no history of DVT or PE. She denies any changes in sensation in her lower extremity. She states that she does have some "muscle cramp" sensation.   The history is provided by the patient. No language interpreter was used.    Past Medical History:  Diagnosis Date  . Asthma   . Bowel obstruction   . Environmental allergies   . GERD (gastroesophageal reflux disease)   . H. pylori infection   . Hypertension     Patient Active Problem List   Diagnosis Date Noted  . Moderate persistent asthma 03/20/2016  . Other allergic rhinitis 03/20/2016  . Cough, persistent 03/20/2016  . Hypoglycemia 04/17/2015  . Small bowel obstruction   . Generalized abdominal pain   . Abdominal pain   . Malnutrition of moderate degree 04/11/2015  . SBO (small bowel obstruction) 04/10/2015  . HTN (hypertension) 04/10/2015    Past Surgical History:  Procedure Laterality Date  . ABDOMINAL HYSTERECTOMY    . BREAST BIOPSY    . COLON SURGERY    . KNEE SURGERY    . LAPAROTOMY N/A 04/18/2015   Procedure: Exploratory laparotomy with lysis of adhesions, possible bowel resection;  Surgeon: Ralene Ok, MD;  Location: [facility];  Service: General;  Laterality: N/A;  . NASAL SINUS SURGERY    . THYROID SURGERY      OB History    No data available       Home Medications    Prior to Admission medications   Medication  Sig Start Date End Date Taking? Authorizing Provider  acetaminophen (TYLENOL) 500 MG tablet Take 500 mg by mouth daily as needed for mild pain, moderate pain, fever or headache. For pain    Historical Provider, MD  albuterol (PROVENTIL) (2.5 MG/3ML) 0.083% nebulizer solution Take 3 mLs (2.5 mg total) by nebulization 2 (two) times daily as needed for wheezing or shortness of breath. 07/05/16   Adelina Mings, MD  azelastine (ASTELIN) 0.1 % nasal spray Place 2 sprays into both nostrils 2 (two) times daily. 11/09/15   Roselyn Malachy Moan, MD  Beclomethasone Dipropionate (QNASL) 80 MCG/ACT AERS Place 1 spray into the nose as needed (congestion).    Historical Provider, MD  benzonatate (TESSALON) 100 MG capsule Take 1 capsule (100 mg total) by mouth every 8 (eight) hours. Patient not taking: Reported on 03/20/2016 08/30/15   Varney Biles, MD  Cholecalciferol (VITAMIN D3 PO) Take 1,000 mg by mouth daily at 12 noon.    Historical Provider, MD  docusate sodium (COLACE) 100 MG capsule Take 1 capsule (100 mg total) by mouth 2 (two) times daily. For constipation Patient not taking: Reported on 03/20/2016 04/24/15   Ripudeep Krystal Eaton, MD  doxycycline (VIBRAMYCIN) 100 MG capsule Take 1 capsule (100 mg total) by mouth 2 (two) times daily. 06/08/16   Dalia Heading, PA-C  Guaifenesin 1200 MG TB12 Take 1 tablet (1,200 mg total) by mouth 2 (two) times daily. 06/08/16   Christopher Lawyer, PA-C  ipratropium (ATROVENT) 0.06 % nasal spray Place 1 spray into both nostrils 3 (three) times daily. 03/20/16   Adelina Mings, MD  loratadine (CLARITIN) 10 MG tablet Take 10 mg by mouth daily as needed. For allergy symptoms    Historical Provider, MD  losartan-hydrochlorothiazide (HYZAAR) 100-12.5 MG per tablet Take 1 tablet by mouth daily.    Historical Provider, MD  omeprazole (PRILOSEC) 40 MG capsule Take 40 mg by mouth daily as needed. For acid reflux    Historical Provider, MD  predniSONE (DELTASONE) 50 MG tablet Take  1 tablet (50 mg total) by mouth daily. 06/08/16   Dalia Heading, PA-C  Probiotic Product (PROBIOTIC PO) Take 1 capsule by mouth daily.    Historical Provider, MD  promethazine-dextromethorphan (PROMETHAZINE-DM) 6.25-15 MG/5ML syrup Take 5 mLs by mouth 4 (four) times daily as needed for cough. 06/08/16   Dalia Heading, PA-C  SYMBICORT 160-4.5 MCG/ACT inhaler Inhale 2 puffs into the lungs 2 (two) times daily. 12/12/15   Roselyn Malachy Moan, MD    Family History Family History  Problem Relation Age of Onset  . Allergic rhinitis Neg Hx   . Angioedema Neg Hx   . Asthma Neg Hx   . Eczema Neg Hx   . Immunodeficiency Neg Hx   . Urticaria Neg Hx     Social History Social History  Substance Use Topics  . Smoking status: Former Smoker    Types: Cigarettes  . Smokeless tobacco: Never Used  . Alcohol use No     Allergies   Asa buff (mag [buffered aspirin]; Fish allergy; Other; Peanut-containing drug products; Penicillins; Shellfish-derived products; and Strawberry extract   Review of Systems Review of Systems  All other systems reviewed and are negative.    Physical Exam Updated Vital Signs BP 134/78   Pulse 71   Temp 98 F (36.7 C) (Oral)   Resp 18   SpO2 98%   Physical Exam  Constitutional: She is oriented to person, place, and time. She appears well-developed and well-nourished.  HENT:  Head: Normocephalic and atraumatic.  Eyes: Conjunctivae and EOM are normal. Pupils are equal, round, and reactive to light.  Neck: Normal range of motion. Neck supple.  Cardiovascular: Normal rate, regular rhythm and intact distal pulses.  Exam reveals no gallop and no friction rub.   No murmur heard. Intact distal pulses with brisk capillary refill  Pulmonary/Chest: Effort normal and breath sounds normal. No respiratory distress. She has no wheezes. She has no rales. She exhibits no tenderness.  Abdominal: Soft. Bowel sounds are normal. She exhibits no distension and no mass. There is  no tenderness. There is no rebound and no guarding.  Musculoskeletal: Normal range of motion. She exhibits no edema or tenderness.  Right calf is mildly tender to palpation, no bony abnormality or deformity  Neurological: She is alert and oriented to person, place, and time.  Neurovascularly intact  Skin: Skin is warm and dry.  Skin is intact, without any evidence of rashes or lesions  Psychiatric: She has a normal mood and affect. Her behavior is normal. Judgment and thought content normal.  Nursing note and vitals reviewed.    ED Treatments / Results  Labs (all labs ordered are listed, but only abnormal results are displayed) Labs Reviewed  I-STAT CHEM 8, ED - Abnormal; Notable for the following:  Result Value   Potassium 3.4 (*)    Calcium, Ion 1.12 (*)    All other components within normal limits    EKG  EKG Interpretation None       Radiology No results found.  Procedures Procedures (including critical care time)  Medications Ordered in ED Medications - No data to display   Initial Impression / Assessment and Plan / ED Course  I have reviewed the triage vital signs and the nursing notes.  Pertinent labs & imaging results that were available during my care of the patient were reviewed by me and considered in my medical decision making (see chart for details).     Patient here with right calf pain. He has a history of the same. Reports muscle cramping sensation.  There is no evidence of infection on exam. No evidence of trauma. She has intact distal pulses and normal sensation. She is neurovascularly intact. Electrolytes are reassuring. DVT study is negative. Do not feel that any additional emergency department workup is needed. Will recommend close follow-up with primary care/patient's vascular specialist.  Final Clinical Impressions(s) / ED Diagnoses   Final diagnoses:  Right leg pain    New Prescriptions New Prescriptions   No medications on file      Montine Circle, PA-C 08/22/16 [place], DO 08/22/16 1955

## 2016-08-22 NOTE — Progress Notes (Signed)
**  Preliminary report by tech**  Right lower extremity venous duplex complete. There is no evidence of deep or superficial vein thrombosis involving the right lower extremity. All visualized vessels appear patent and compressible. There is no evidence of a Baker's cyst on the right. Results were given to Montine Circle PA.  08/22/16 7:49 PM Carlos Levering RVT

## 2016-08-28 ENCOUNTER — Encounter: Payer: Self-pay | Admitting: Allergy and Immunology

## 2016-08-28 ENCOUNTER — Ambulatory Visit (INDEPENDENT_AMBULATORY_CARE_PROVIDER_SITE_OTHER): Payer: Medicare HMO | Admitting: Allergy and Immunology

## 2016-08-28 VITALS — BP 110/62 | HR 62 | Temp 98.1°F | Resp 16 | Ht 64.0 in | Wt 163.0 lb

## 2016-08-28 DIAGNOSIS — J0100 Acute maxillary sinusitis, unspecified: Secondary | ICD-10-CM

## 2016-08-28 DIAGNOSIS — J3089 Other allergic rhinitis: Secondary | ICD-10-CM

## 2016-08-28 DIAGNOSIS — J4540 Moderate persistent asthma, uncomplicated: Secondary | ICD-10-CM

## 2016-08-28 DIAGNOSIS — J019 Acute sinusitis, unspecified: Secondary | ICD-10-CM | POA: Insufficient documentation

## 2016-08-28 MED ORDER — PREDNISONE 1 MG PO TABS: 10.0000 mg | ORAL_TABLET | Freq: Every day | ORAL | Status: DC

## 2016-08-28 MED ORDER — FLUTICASONE PROPIONATE HFA 110 MCG/ACT IN AERO: 2.0000 | INHALATION_SPRAY | Freq: Two times a day (BID) | RESPIRATORY_TRACT | 5 refills | Status: DC

## 2016-08-28 MED ORDER — FLUTICASONE PROPIONATE 50 MCG/ACT NA SUSP: 2.0000 | Freq: Every day | NASAL | 5 refills | Status: DC

## 2016-08-28 MED ORDER — ALBUTEROL SULFATE HFA 108 (90 BASE) MCG/ACT IN AERS: 2.0000 | INHALATION_SPRAY | RESPIRATORY_TRACT | 1 refills | Status: DC | PRN

## 2016-08-28 NOTE — Assessment & Plan Note (Addendum)
Well-controlled, we will stepdown therapy at this time.  A prescription has been provided for Flovent 110 g, 2 inhalations via spacer device twice a day.  Proper instructions for a spacer device have been discussed and demonstrated.  If lower respiratory symptoms progress in frequency and/or severity, the patient is to resume Symbicort as directed.  Continue albuterol every 4-6 hours as needed.  A refill prescription has been provided for albuterol HFA.  Subjective and objective measures of pulmonary function will be followed and the treatment plan will be adjusted accordingly.

## 2016-08-28 NOTE — Patient Instructions (Addendum)
Acute sinusitis  Prednisone has been provided, 20 mg x 4 days, 10 mg x1 day, then stop.  Nasal saline lavage (NeilMed) as needed has been recommended along with instructions for proper administration.  For thick post nasal drainage, add guaifenesin [phone] mg (Mucinex)  twice daily as needed with adequate hydration as discussed.  Alaine has been asked to contact me if her symptoms persist, progress, or if she becomes febrile. Otherwise, she may return for follow up in 4 months.  Moderate persistent asthma  Well-controlled, we will stepdown therapy at this time.  A prescription has been provided for Flovent 110 g, 2 inhalations via spacer device twice a day.  Proper instructions for a spacer device have been discussed and demonstrated.  If lower respiratory symptoms progress in frequency and/or severity, the patient is to resume Symbicort as directed.  Continue albuterol every 4-6 hours as needed.  A refill prescription has been provided for albuterol HFA.  Subjective and objective measures of pulmonary function will be followed and the treatment plan will be adjusted accordingly.  Other allergic rhinitis Currently poorly controlled secondary to pollen exposure.  Continue appropriate allergen avoidance measures and ipratropium 0.06% nasal spray, one spray per nostril every 8 hours as needed.  A prescription has been provided for fluticasone nasal spray, 2 sprays per nostril daily as needed. Proper nasal spray technique has been discussed and demonstrated.  I have also recommended nasal saline spray (i.e., Simply Saline) or nasal saline lavage (i.e., NeilMed) as needed and prior to medicated nasal sprays.   Return in about 4 months (around 12/28/2016), or if symptoms worsen or fail to improve.

## 2016-08-28 NOTE — Progress Notes (Addendum)
Follow-up Note  RE: Angelica [name] MRN: [id number] DOB: 1940/08/29 Date of Office Visit: 08/28/2016  Primary care provider: Foye Spurling, MD Referring provider: Foye Spurling, MD  History of present illness: Angelica [name] is a 76 y.o. female with persistent asthma, allergic rhinoconjunctivitis, and food allergies presenting today for sick visit.  She was last seen in this clinic in October 2017.  She reports that since "the first day of Spring" she has been experiencing sneezing, postnasal drainage, irritated throat, sinus pressure, and coughing.  She believes that the symptoms are related to pollen exposure.  She denies fevers and chills.  She states that her asthma has been well controlled.  She rarely requires albuterol rescue and denies  nocturnal awakenings due to lower respiratory symptoms.  She admits that she has only been taking one inhalation of Symbicort 160 g, one or 2 times per day instead of 2 inhalations twice a day as previously recommended.   Assessment and plan: Acute sinusitis  Prednisone has been provided, 20 mg x 4 days, 10 mg x1 day, then stop.  Nasal saline lavage (NeilMed) as needed has been recommended along with instructions for proper administration.  For thick post nasal drainage, add guaifenesin [phone] mg (Mucinex)  twice daily as needed with adequate hydration as discussed.  Angelica [name] has been asked to contact me if her symptoms persist, progress, or if she becomes febrile. Otherwise, she may return for follow up in 4 months.  Moderate persistent asthma Well-controlled, we will stepdown therapy at this time.  A prescription has been provided for Flovent 110 g, 2 inhalations via spacer device twice a day.  Proper instructions for a spacer device have been discussed and demonstrated.  If lower respiratory symptoms progress in frequency and/or severity, the patient is to resume Symbicort as directed.  Continue albuterol every 4-6 hours as needed.   A refill prescription has been provided for albuterol HFA.  Subjective and objective measures of pulmonary function will be followed and the treatment plan will be adjusted accordingly.  Other allergic rhinitis Currently poorly controlled secondary to pollen exposure.  Continue appropriate allergen avoidance measures and ipratropium 0.06% nasal spray, one spray per nostril every 8 hours as needed.  A prescription has been provided for fluticasone nasal spray, 2 sprays per nostril daily as needed. Proper nasal spray technique has been discussed and demonstrated.  I have also recommended nasal saline spray (i.e., Simply Saline) or nasal saline lavage (i.e., NeilMed) as needed and prior to medicated nasal sprays.   Meds ordered this encounter  Medications  . fluticasone (FLOVENT HFA) 110 MCG/ACT inhaler    Sig: Inhale 2 puffs into the lungs 2 (two) times daily. Use with spacer    Dispense:  1 Inhaler    Refill:  5  . fluticasone (FLONASE) 50 MCG/ACT nasal spray    Sig: Place 2 sprays into both nostrils daily.    Dispense:  16 g    Refill:  5  . albuterol (VENTOLIN HFA) 108 (90 Base) MCG/ACT inhaler    Sig: Inhale 2 puffs into the lungs every 4 (four) hours as needed for wheezing or shortness of breath.    Dispense:  1 Inhaler    Refill:  1  . predniSONE (DELTASONE) tablet 10 mg    Diagnostics: Spirometry:  Normal with an FEV1 of 101% predicted.  Please see scanned spirometry results for details.    Physical examination: Blood pressure 110/62, pulse 62, temperature 98.1 F (36.7 C), temperature source  Oral, resp. rate 16, height '5\' 4"'$  (1.626 m), weight 163 lb (73.9 kg), SpO2 98 %.  General: Alert, interactive, in no acute distress. HEENT: TMs pearly gray, turbinates moderately edematous with thick discharge, post-pharynx moderately erythematous. Neck: Supple without lymphadenopathy. Lungs: Clear to auscultation without wheezing, rhonchi or rales. CV: Normal S1, S2 without  murmurs. Skin: Warm and dry, without lesions or rashes.  The following portions of the patient's history were reviewed and updated as appropriate: allergies, current medications, past family history, past medical history, past social history, past surgical history and problem list.  Allergies as of 08/28/2016      Reactions   Asa Buff (mag [buffered Aspirin] Other (See Comments)   Excessive sweating   Fish Allergy Other (See Comments)   Unknown reaction   Other Other (See Comments)   Gelcaps; gel-containing capsules; extended release tablets   Peanut-containing Drug Products Other (See Comments)   From allergy test   Penicillins Itching   Has patient had a PCN reaction causing immediate rash, facial/tongue/throat swelling, SOB or lightheadedness with hypotension: No Has patient had a PCN reaction causing severe rash involving mucus membranes or skin necrosis: No Has patient had a PCN reaction that required hospitalization No Has patient had a PCN reaction occurring within the last 10 years: No If all of the above answers are "NO", then may proceed with Cephalosporin use.   Shellfish-derived Products Other (See Comments)   From allergy test   Strawberry Extract Other (See Comments)   Worsened allergy symptoms      Medication List       Accurate as of 08/28/16  6:38 PM. Always use your most recent med list.          acetaminophen 500 MG tablet Commonly known as:  TYLENOL Take 500 mg by mouth daily as needed for mild pain, moderate pain, fever or headache. For pain   albuterol (2.5 MG/3ML) 0.083% nebulizer solution Commonly known as:  PROVENTIL Take 3 mLs (2.5 mg total) by nebulization 2 (two) times daily as needed for wheezing or shortness of breath.   albuterol 108 (90 Base) MCG/ACT inhaler Commonly known as:  VENTOLIN HFA Inhale 2 puffs into the lungs every 4 (four) hours as needed for wheezing or shortness of breath.   azelastine 0.1 % nasal spray Commonly known as:   ASTELIN Place 2 sprays into both nostrils 2 (two) times daily.   fluticasone 110 MCG/ACT inhaler Commonly known as:  FLOVENT HFA Inhale 2 puffs into the lungs 2 (two) times daily. Use with spacer   fluticasone 50 MCG/ACT nasal spray Commonly known as:  FLONASE Place 2 sprays into both nostrils daily.   Guaifenesin 1200 MG Tb12 Take 1 tablet (1,200 mg total) by mouth 2 (two) times daily.   ipratropium 0.06 % nasal spray Commonly known as:  ATROVENT Place 1 spray into both nostrils 3 (three) times daily.   loratadine 10 MG tablet Commonly known as:  CLARITIN Take 10 mg by mouth daily as needed. For allergy symptoms   losartan-hydrochlorothiazide 100-12.5 MG tablet Commonly known as:  HYZAAR Take 1 tablet by mouth daily.   omeprazole 40 MG capsule Commonly known as:  PRILOSEC Take 40 mg by mouth daily as needed. For acid reflux   predniSONE 50 MG tablet Commonly known as:  DELTASONE Take 1 tablet (50 mg total) by mouth daily.   SYMBICORT 160-4.5 MCG/ACT inhaler Generic drug:  budesonide-formoterol Inhale 2 puffs into the lungs 2 (two) times daily.   VITAMIN  D3 PO Take 1,000 mg by mouth daily at 12 noon.       Allergies  Allergen Reactions  . Asa Buff (Mag [Buffered Aspirin] Other (See Comments)    Excessive sweating  . Fish Allergy Other (See Comments)    Unknown reaction  . Other Other (See Comments)    Gelcaps; gel-containing capsules; extended release tablets  . Peanut-Containing Drug Products Other (See Comments)    From allergy test  . Penicillins Itching    Has patient had a PCN reaction causing immediate rash, facial/tongue/throat swelling, SOB or lightheadedness with hypotension: No Has patient had a PCN reaction causing severe rash involving mucus membranes or skin necrosis: No Has patient had a PCN reaction that required hospitalization No Has patient had a PCN reaction occurring within the last 10 years: No If all of the above answers are "NO", then  may proceed with Cephalosporin use.  . Shellfish-Derived Products Other (See Comments)    From allergy test  . Strawberry Extract Other (See Comments)    Worsened allergy symptoms   Review of systems: Review of systems negative except as noted in HPI / PMHx or noted below: Constitutional: Negative.  HENT: Negative.   Eyes: Negative.  Respiratory: Negative.   Cardiovascular: Negative.  Gastrointestinal: Negative.  Genitourinary: Negative.  Musculoskeletal: Negative.  Neurological: Negative.  Endo/Heme/Allergies: Negative.  Cutaneous: Negative.  Past Medical History:  Diagnosis Date  . Asthma   . Bowel obstruction   . Environmental allergies   . GERD (gastroesophageal reflux disease)   . H. pylori infection   . Hypertension     Family History  Problem Relation Age of Onset  . Allergic rhinitis Neg Hx   . Angioedema Neg Hx   . Asthma Neg Hx   . Eczema Neg Hx   . Immunodeficiency Neg Hx   . Urticaria Neg Hx     Social History   Social History  . Marital status: Widowed    Spouse name: N/A  . Number of children: N/A  . Years of education: N/A   Occupational History  . Not on file.   Social History Main Topics  . Smoking status: Former Smoker    Types: Cigarettes  . Smokeless tobacco: Never Used  . Alcohol use No  . Drug use: No  . Sexual activity: No   Other Topics Concern  . Not on file   Social History Narrative  . No narrative on file    I appreciate the opportunity to take part in Inara's care. Please do not hesitate to contact me with questions.  Sincerely,   R. Edgar Frisk, MD

## 2016-08-28 NOTE — Assessment & Plan Note (Signed)
Currently poorly controlled secondary to pollen exposure.  Continue appropriate allergen avoidance measures and ipratropium 0.06% nasal spray, one spray per nostril every 8 hours as needed.  A prescription has been provided for fluticasone nasal spray, 2 sprays per nostril daily as needed. Proper nasal spray technique has been discussed and demonstrated.  I have also recommended nasal saline spray (i.e., Simply Saline) or nasal saline lavage (i.e., NeilMed) as needed and prior to medicated nasal sprays.

## 2016-08-28 NOTE — Assessment & Plan Note (Signed)
   Prednisone has been provided, 20 mg x 4 days, 10 mg x1 day, then stop.  Nasal saline lavage (NeilMed) as needed has been recommended along with instructions for proper administration.  For thick post nasal drainage, add guaifenesin [phone] mg (Mucinex)  twice daily as needed with adequate hydration as discussed.  Angelica [name] has been asked to contact me if her symptoms persist, progress, or if she becomes febrile. Otherwise, she may return for follow up in 4 months.

## 2016-08-28 NOTE — Assessment & Plan Note (Deleted)
Currently poorly controlled secondary to pollen exposure.  Continue appropriate allergen avoidance measures and ipratropium 0.06% nasal spray, one spray per nostril every 8 hours as needed.  A prescription has been provided for fluticasone nasal spray, 2 sprays per nostril daily as needed. Proper nasal spray technique has been discussed and demonstrated.

## 2016-08-29 ENCOUNTER — Telehealth: Payer: Self-pay | Admitting: Allergy and Immunology

## 2016-08-29 NOTE — Telephone Encounter (Signed)
Patient was seen by Dr. Verlin Fester yesterday, 08-28-16, and he prescribed her 3 medications. She said she can pick up 2 of them, but the 3rd she cannot afford and wants to know if you have a sample for her to pick up on Friday. She wasn't sure of its name, but said it was an inhaler. The only on I saw was Ventolin.

## 2016-08-30 NOTE — Telephone Encounter (Signed)
Spoke with patient. She advised that her granddaughter did get  The prescriptions for her. She was still unable to tell me a name. I advised that in the future if she would call before she is out and let us know the name then we may be able to give a sample or two from time to time.

## 2016-09-04 DIAGNOSIS — I83811 Varicose veins of right lower extremities with pain: Secondary | ICD-10-CM | POA: Diagnosis not present

## 2016-09-04 DIAGNOSIS — M79604 Pain in right leg: Secondary | ICD-10-CM | POA: Diagnosis not present

## 2016-09-04 DIAGNOSIS — I83891 Varicose veins of right lower extremities with other complications: Secondary | ICD-10-CM | POA: Diagnosis not present

## 2016-09-06 DIAGNOSIS — M79604 Pain in right leg: Secondary | ICD-10-CM | POA: Diagnosis not present

## 2016-09-06 DIAGNOSIS — I83811 Varicose veins of right lower extremities with pain: Secondary | ICD-10-CM | POA: Diagnosis not present

## 2016-09-07 DIAGNOSIS — M79604 Pain in right leg: Secondary | ICD-10-CM | POA: Diagnosis not present

## 2016-09-07 DIAGNOSIS — I83811 Varicose veins of right lower extremities with pain: Secondary | ICD-10-CM | POA: Diagnosis not present

## 2016-09-12 DIAGNOSIS — G8929 Other chronic pain: Secondary | ICD-10-CM | POA: Diagnosis not present

## 2016-09-12 DIAGNOSIS — M1711 Unilateral primary osteoarthritis, right knee: Secondary | ICD-10-CM | POA: Diagnosis not present

## 2016-09-12 DIAGNOSIS — M25561 Pain in right knee: Secondary | ICD-10-CM | POA: Diagnosis not present

## 2016-09-25 ENCOUNTER — Telehealth: Payer: Self-pay | Admitting: Allergy and Immunology

## 2016-09-25 NOTE — Telephone Encounter (Signed)
Patient's daughter called and asked if a fax was received from [facility] for her mother Angelica [name]. Symbicort is too expensive for her so they called [employer] and were given a Tier 1 exception for her to be able to get this at a reduced cost. She also would like to know if there are any samples that her mother could get today, of Symbicort, because her mom only has 2 puffs left.

## 2016-09-25 NOTE — Telephone Encounter (Signed)
Spoke with the daughter and informed her I would look out for the paperwork. I also informed her that we have 2 samples waiting for her up front.

## 2016-09-28 NOTE — Telephone Encounter (Signed)
PA was denied for Symbicort. Please advise?

## 2016-10-01 NOTE — Telephone Encounter (Signed)
What dual acting inhaler is covered? Thanks

## 2016-10-03 ENCOUNTER — Encounter: Payer: Self-pay | Admitting: Allergy & Immunology

## 2016-10-03 ENCOUNTER — Ambulatory Visit (INDEPENDENT_AMBULATORY_CARE_PROVIDER_SITE_OTHER): Payer: Medicare HMO | Admitting: Allergy & Immunology

## 2016-10-03 VITALS — BP 132/84 | HR 72 | Resp 16

## 2016-10-03 DIAGNOSIS — J0100 Acute maxillary sinusitis, unspecified: Secondary | ICD-10-CM

## 2016-10-03 DIAGNOSIS — J3089 Other allergic rhinitis: Secondary | ICD-10-CM | POA: Diagnosis not present

## 2016-10-03 DIAGNOSIS — J4541 Moderate persistent asthma with (acute) exacerbation: Secondary | ICD-10-CM | POA: Diagnosis not present

## 2016-10-03 MED ORDER — FLUTICASONE FUROATE-VILANTEROL 200-25 MCG/INH IN AEPB: 1.0000 | INHALATION_SPRAY | Freq: Every day | RESPIRATORY_TRACT | 5 refills | Status: DC

## 2016-10-03 MED ORDER — AZITHROMYCIN 250 MG PO TABS: ORAL_TABLET | ORAL | 0 refills | Status: DC

## 2016-10-03 MED ORDER — METHYLPREDNISOLONE ACETATE 40 MG/ML IJ SUSP
40.0000 mg | Freq: Once | INTRAMUSCULAR | Status: AC
  Administered 2016-10-03: 40 mg via INTRAMUSCULAR

## 2016-10-03 NOTE — Telephone Encounter (Addendum)
[place].  Symbicort is a covered med but copay is $235.  Ruthe Mannan is not covered.  [place]. [phone].  Spoke with Joy.   Covered alternatives are:   Symbicort is covered at $235, out of pocket. Advair Discus/HFA is covered at about $265, out of pocket. Breo Ellipta 200 is about $100  out of pocket.  Patient to see Dr. Ernst Bowler today for sick visit.  Sent information to Dr. Ernst Bowler.

## 2016-10-03 NOTE — Progress Notes (Signed)
FOLLOW UP  Date of Service/Encounter:  10/03/16   Assessment:   Moderate persistent asthma with acute exacerbation  Allergic rhinitis  Acute maxillary sinusitis   Asthma Reportables:  Severity: moderate persistent  Risk: high Control: not well controlled   Plan/Recommendations:   1. Moderate persistent asthma with acute exacerbation - Lung function looked worse today, but you did respond to a nebulizer treatment. - Steroid injection provided in clinic today, which should last for a few days to help you make it through this asthma exacerbation. - We will change to from Symbicort to Breo 200/25 one inhalation once daily. 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  - Daily controller medication(s): Breo 200/25 one inhalation once daily - Rescue medications: Ventolin 4 puffs every 4-6 hours as needed or albuterol nebulizer one vial puffs every 4-6 hours as needed - Asthma control goals:  * Full participation in all desired activities (may need albuterol before activity) * Albuterol use two time or less a week on average (not counting use with activity) * Cough interfering with sleep two time or less a month * Oral steroids no more than once a year * No hospitalizations  2. Other allergic rhinitis - Continue with fluticasone nasal spray two sprays per nostril once daily. - Continue with ipratropium nasal spray one spray per nostril every 6 hours as needed. - Continue with Claritin '10mg'$  daily.   3. Acute maxillary sinusitis - Start azithromycin course: '500mg'$  (two tablets) on the first day and then '250mg'$  (one tablet) daily for the next four days. - Continue with nasal saline rinses as well as Mucinex twice daily.  4. Follow up with three months with Dr. Verlin Fester.   Subjective:   Angelica [name] is a 76 y.o. female presenting today for follow up of  Chief Complaint    Patient presents with  . Asthma  . Headache    sinus headache    Angelica [name] has a history of the following: Patient Active Problem List   Diagnosis Date Noted  . Acute sinusitis 08/28/2016  . Moderate persistent asthma 03/20/2016  . Other allergic rhinitis 03/20/2016  . Cough, persistent 03/20/2016  . Hypoglycemia 04/17/2015  . Small bowel obstruction ([place])   . Generalized abdominal pain   . Abdominal pain   . Malnutrition of moderate degree 04/11/2015  . SBO (small bowel obstruction) ([place]) 04/10/2015  . HTN (hypertension) 04/10/2015    History obtained from: chart review and patient.  Angelica [name] was referred by Foye Spurling, MD.     Angelica [name] is a 76 y.o. female presenting for a sick visit. She was last seen in April 2018 by Dr. Verlin Fester for a sick visit. At that time, she was treated with prednisone for sinusitis as well as nasal saline lavage and Mucinex area for her asthma, she was continued on Flovent 110 g 2 inhalations twice daily. Prior to this, she was on Symbicort. For her rhinitis, she was continued on Atrovent, Flonase, and nasal saline lavage.  Since the last visit, she did improve with the prednisone. This week she has been coughing "real bad". She has had no fevers but she does endorse chills overnight. She reports bilateral frontal sinus tenderness, which has been progressing for two weeks. She has been using albuterol around the clock but it is not helping at all. She has been using Symbicort two puffs  twice daily. She is not using a spacer at all because she did not feel comfortable using it. She would rather not use a spacer at all.  Her absolutely eosinophil count was 600 in January 2018. She does not remember the last time that she got the prednisone prior to April 2018. Prior to the onset of symptoms of the current illness, she only used the albuterol 1-2 times per week or so.   Her nasal symptoms have been well controlled with the two nasal sprays.  Otherwise, there have been no changes to her past medical history, surgical history, family history, or social history.    Review of Systems: a 14-point review of systems is pertinent for what is mentioned in HPI.  Otherwise, all other systems were negative. Constitutional: negative other than that listed in the HPI Eyes: negative other than that listed in the HPI Ears, nose, mouth, throat, and face: negative other than that listed in the HPI Respiratory: negative other than that listed in the HPI Cardiovascular: negative other than that listed in the HPI Gastrointestinal: negative other than that listed in the HPI Genitourinary: negative other than that listed in the HPI Integument: negative other than that listed in the HPI Hematologic: negative other than that listed in the HPI Musculoskeletal: negative other than that listed in the HPI Neurological: negative other than that listed in the HPI Allergy/Immunologic: negative other than that listed in the HPI    Objective:   Blood pressure 132/84, pulse 72, resp. rate 16. There is no height or weight on file to calculate BMI.   Physical Exam:  General: Alert, interactive, in no acute distress. Pleasant female. Able to speak in full sentences.  Eyes: No conjunctival injection present on the right, No conjunctival injection present on the left, PERRL bilaterally, No discharge on the right, No discharge on the left and No Horner-Trantas dots present Ears: Right TM pearly gray with normal light reflex, Left TM pearly gray with normal light reflex, Right TM intact without perforation and Left TM intact without perforation.  Nose/Throat: External nose within normal limits, nasal crease present and septum midline, turbinates edematous and pale with crusty discharge, post-pharynx erythematous with cobblestoning in the posterior oropharynx. Tonsils 2+ without exudates. Bilateral maxillary and frontal sinus tenderness.  Neck: Supple without  thyromegaly. Lungs: Mildly decreased breath sounds with expiratory wheezing bilaterally. Increased work of breathing. CV: Normal S1/S2, no murmurs. Capillary refill <2 seconds.  Skin: Warm and dry, without lesions or rashes. Neuro:   Grossly intact. No focal deficits appreciated. Responsive to questions.   Diagnostic studies:  Spirometry: results abnormal (FEV1: 1.27/71%, FVC: 1.61/69%, FEV1/FVC: 78%).    Spirometry consistent with possible restrictive disease. Albuterol/Atrovent nebulizer treatment given in clinic with significant improvement in the FVC (245m/17%).  Allergy Studies: none    JSalvatore Marvel MD [place]

## 2016-10-03 NOTE — Patient Instructions (Addendum)
1. Moderate persistent asthma with acute exacerbation - Lung function looked worse today, but you did respond to a nebulizer treatment. - Steroid injection provided in clinic today, which should last for a few days to help you make it through this asthma exacerbation. - We will change to from Symbicort to Breo 200/25 one inhalation once daily. - Daily controller medication(s): Breo 200/25 one inhalation once daily - Rescue medications: Ventolin 4 puffs every 4-6 hours as needed or albuterol nebulizer one vial puffs every 4-6 hours as needed - Asthma control goals:  * Full participation in all desired activities (may need albuterol before activity) * Albuterol use two time or less a week on average (not counting use with activity) * Cough interfering with sleep two time or less a month * Oral steroids no more than once a year * No hospitalizations  2. Other allergic rhinitis - Continue with fluticasone nasal spray two sprays per nostril once daily. - Continue with ipratropium nasal spray one spray per nostril every 6 hours as needed. - Continue with Claritin '10mg'$  daily.   3. Acute maxillary sinusitis - Start azithromycin course: '500mg'$  (two tablets) on the first day and then '250mg'$  (one tablet) daily for the next four days. - Continue with nasal saline rinses as well as Mucinex twice daily.  4. No Follow-up on file.  Please inform us of any Emergency Department visits, hospitalizations, or changes in symptoms. Call us before going to the ED for breathing or allergy symptoms since we might be able to fit you in for a sick visit. Feel free to contact us anytime with any questions, problems, or concerns.  It was a pleasure to meet you today! Happy spring!   Websites that have reliable patient information: 1. American Academy of Asthma, Allergy, and Immunology: www.aaaai.org 2. Food Allergy Research and Education (FARE): foodallergy.org 3. Mothers of Asthmatics:  http://www.asthmacommunitynetwork.org 4. American College of Allergy, Asthma, and Immunology: www.acaai.org

## 2016-12-07 ENCOUNTER — Telehealth: Payer: Self-pay | Admitting: Allergy & Immunology

## 2016-12-07 NOTE — Telephone Encounter (Signed)
FYI: Called patient. She asked about Symbicort 160 but per Dr. Ernst Bowler notes on 10/03/2016 she was switched from Symbicort 160 to Breo 200. Patient stated that both Symbicort and Breo was over $200. I informed patient that I would put a Breo up front for her to pick up.

## 2016-12-07 NOTE — Telephone Encounter (Signed)
Pt called to see if we had any samples of symbicort 160 because she don't have the money to pay copay.[phone]

## 2016-12-07 NOTE — Telephone Encounter (Signed)
I talked to Angelica [name] and she told me that she figured out how to get it for $12 with the Good Rx card.  Salvatore Marvel, MD [facility]

## 2016-12-31 ENCOUNTER — Encounter: Payer: Self-pay | Admitting: Allergy and Immunology

## 2016-12-31 ENCOUNTER — Ambulatory Visit (INDEPENDENT_AMBULATORY_CARE_PROVIDER_SITE_OTHER): Payer: Medicare HMO | Admitting: Allergy and Immunology

## 2016-12-31 VITALS — BP 120/70 | Resp 16

## 2016-12-31 DIAGNOSIS — J3089 Other allergic rhinitis: Secondary | ICD-10-CM

## 2016-12-31 DIAGNOSIS — J4540 Moderate persistent asthma, uncomplicated: Secondary | ICD-10-CM

## 2016-12-31 MED ORDER — AZELASTINE HCL 0.1 % NA SOLN: 2.0000 | Freq: Two times a day (BID) | NASAL | 5 refills | Status: DC

## 2016-12-31 NOTE — Progress Notes (Signed)
Follow-up Note  RE: Angelica [name] MRN: [id number] DOB: 1941-01-22 Date of Office Visit: 12/31/2016  Primary care provider: Foye Spurling, MD Referring provider: Foye Spurling, MD  History of present illness: Angelica [name] is a 76 y.o. female with persistent asthma and allergic rhinitis presenting today for follow up.  She was last seen in this clinic by Dr. Ernst Bowler on 10/03/2016.  At that time, she was prescribed Adair Patter but reports that she does not like this medication because of the taste of the dry powder in her mouth.  She would prefer Symbicort which she has been on the past.  She has not required albuterol this summer but states that historically her asthma is more active during the wintertime.  Her last visit, she was prescribed fluticasone nasal spray, however is uncomfortable with this medication because of her history of cataracts.  She is currently taking ipratropium 0.06% nasal spray, however still experiencing some persistent nasal congestion.  Her other allergy symptoms have been well controlled with loratadine and/or guaifenesin.   Assessment and plan: Moderate persistent asthma  We will switch from Breo to Symbicort 160-4.5 g, 2 inhalations via spacer device twice a day.  Continue albuterol every 4-6 hours if needed.  Subjective and objective measures of pulmonary function will be followed and the treatment plan will be adjusted accordingly.  Other allergic rhinitis Currently with suboptimal control.  For now, continue ipratropium 0.06 mg, 2 sprays per nostril 2 or 3 times daily as needed.  A prescription has been provided for azelastine nasal spray, 1-2 sprays per nostril 2 times daily as needed.  Continue nasal saline irrigation prior to medicated nasal spray use.   Meds ordered this encounter  Medications  . azelastine (ASTELIN) 0.1 % nasal spray    Sig: Place 2 sprays into both nostrils 2 (two) times daily.    Dispense:  30 mL    Refill:   5    Diagnostics: Spirometry:  Normal with an FEV1 of 93% predicted.  Please see scanned spirometry results for details.    Physical examination: Blood pressure 120/70, resp. rate 16.  General: Alert, interactive, in no acute distress. HEENT: TMs pearly gray, turbinates moderately edematous without discharge, post-pharynx unremarkable. Neck: Supple without lymphadenopathy. Lungs: Clear to auscultation without wheezing, rhonchi or rales. CV: Normal S1, S2 without murmurs. Skin: Warm and dry, without lesions or rashes.  The following portions of the patient's history were reviewed and updated as appropriate: allergies, current medications, past family history, past medical history, past social history, past surgical history and problem list.  Allergies as of 12/31/2016      Reactions   Asa Buff (mag [buffered Aspirin] Other (See Comments)   Excessive sweating   Fish Allergy Other (See Comments)   Unknown reaction   Other Other (See Comments)   Gelcaps; gel-containing capsules; extended release tablets   Peanut-containing Drug Products Other (See Comments)   From allergy test   Penicillins Itching   Has patient had a PCN reaction causing immediate rash, facial/tongue/throat swelling, SOB or lightheadedness with hypotension: No Has patient had a PCN reaction causing severe rash involving mucus membranes or skin necrosis: No Has patient had a PCN reaction that required hospitalization No Has patient had a PCN reaction occurring within the last 10 years: No If all of the above answers are "NO", then may proceed with Cephalosporin use.   Shellfish-derived Products Other (See Comments)   From allergy test   Strawberry Extract Other (  See Comments)   Worsened allergy symptoms      Medication List       Accurate as of 12/31/16  6:05 PM. Always use your most recent med list.          acetaminophen 500 MG tablet Commonly known as:  TYLENOL Take 500 mg by mouth daily as needed for  mild pain, moderate pain, fever or headache. For pain   albuterol (2.5 MG/3ML) 0.083% nebulizer solution Commonly known as:  PROVENTIL Take 3 mLs (2.5 mg total) by nebulization 2 (two) times daily as needed for wheezing or shortness of breath.   albuterol 108 (90 Base) MCG/ACT inhaler Commonly known as:  VENTOLIN HFA Inhale 2 puffs into the lungs every 4 (four) hours as needed for wheezing or shortness of breath.   azelastine 0.1 % nasal spray Commonly known as:  ASTELIN Place 2 sprays into both nostrils 2 (two) times daily.   fluticasone 50 MCG/ACT nasal spray Commonly known as:  FLONASE Place 2 sprays into both nostrils daily.   fluticasone furoate-vilanterol 200-25 MCG/INH Aepb Commonly known as:  BREO ELLIPTA Inhale 1 puff into the lungs daily.   Guaifenesin 1200 MG Tb12 Take 1 tablet (1,200 mg total) by mouth 2 (two) times daily.   ipratropium 0.06 % nasal spray Commonly known as:  ATROVENT Place 1 spray into both nostrils 3 (three) times daily.   loratadine 10 MG tablet Commonly known as:  CLARITIN Take 10 mg by mouth daily as needed. For allergy symptoms   losartan-hydrochlorothiazide 100-12.5 MG tablet Commonly known as:  HYZAAR Take 1 tablet by mouth daily.   omeprazole 40 MG capsule Commonly known as:  PRILOSEC Take 40 mg by mouth daily as needed. For acid reflux   VITAMIN D3 PO Take 1,000 mg by mouth daily at 12 noon.       Allergies  Allergen Reactions  . Asa Buff (Mag [Buffered Aspirin] Other (See Comments)    Excessive sweating  . Fish Allergy Other (See Comments)    Unknown reaction  . Other Other (See Comments)    Gelcaps; gel-containing capsules; extended release tablets  . Peanut-Containing Drug Products Other (See Comments)    From allergy test  . Penicillins Itching    Has patient had a PCN reaction causing immediate rash, facial/tongue/throat swelling, SOB or lightheadedness with hypotension: No Has patient had a PCN reaction causing  severe rash involving mucus membranes or skin necrosis: No Has patient had a PCN reaction that required hospitalization No Has patient had a PCN reaction occurring within the last 10 years: No If all of the above answers are "NO", then may proceed with Cephalosporin use.  . Shellfish-Derived Products Other (See Comments)    From allergy test  . Strawberry Extract Other (See Comments)    Worsened allergy symptoms   Review of systems: Review of systems negative except as noted in HPI / PMHx or noted below: Constitutional: Negative.  HENT: Negative.   Eyes: Negative.  Respiratory: Negative.   Cardiovascular: Negative.  Gastrointestinal: Negative.  Genitourinary: Negative.  Musculoskeletal: Negative.  Neurological: Negative.  Endo/Heme/Allergies: Negative.  Cutaneous: Negative.  Past Medical History:  Diagnosis Date  . Asthma   . Bowel obstruction ([place])   . Environmental allergies   . GERD (gastroesophageal reflux disease)   . H. pylori infection   . Hypertension     Family History  Problem Relation Age of Onset  . Allergic rhinitis Neg Hx   . Angioedema Neg Hx   .  Asthma Neg Hx   . Eczema Neg Hx   . Immunodeficiency Neg Hx   . Urticaria Neg Hx     Social History   Social History  . Marital status: Widowed    Spouse name: N/A  . Number of children: N/A  . Years of education: N/A   Occupational History  . Not on file.   Social History Main Topics  . Smoking status: Former Smoker    Types: Cigarettes  . Smokeless tobacco: Never Used  . Alcohol use No  . Drug use: No  . Sexual activity: No   Other Topics Concern  . Not on file   Social History Narrative  . No narrative on file    I appreciate the opportunity to take part in Malyia's care. Please do not hesitate to contact me with questions.  Sincerely,   R. Edgar Frisk, MD

## 2016-12-31 NOTE — Assessment & Plan Note (Addendum)
   We will switch from [facility] to Symbicort 160-4.5 g, 2 inhalations via spacer device twice a day.  Continue albuterol every 4-6 hours if needed.  Subjective and objective measures of pulmonary function will be followed and the treatment plan will be adjusted accordingly.

## 2016-12-31 NOTE — Patient Instructions (Addendum)
Moderate persistent asthma  We will switch from Breo to Symbicort 160-4.5 g, 2 inhalations via spacer device twice a day.  Continue albuterol every 4-6 hours if needed.  Subjective and objective measures of pulmonary function will be followed and the treatment plan will be adjusted accordingly.  Other allergic rhinitis Currently with suboptimal control.  For now, continue ipratropium 0.06 mg, 2 sprays per nostril 2 or 3 times daily as needed.  A prescription has been provided for azelastine nasal spray, 1-2 sprays per nostril 2 times daily as needed.  Continue nasal saline irrigation prior to medicated nasal spray use.   Return in about 4 months (around 05/02/2017), or if symptoms worsen or fail to improve.

## 2016-12-31 NOTE — Assessment & Plan Note (Signed)
Currently with suboptimal control.  For now, continue ipratropium 0.06 mg, 2 sprays per nostril 2 or 3 times daily as needed.  A prescription has been provided for azelastine nasal spray, 1-2 sprays per nostril 2 times daily as needed.  Continue nasal saline irrigation prior to medicated nasal spray use.

## 2017-01-09 ENCOUNTER — Other Ambulatory Visit: Payer: Self-pay | Admitting: Allergy and Immunology

## 2017-01-09 DIAGNOSIS — J3089 Other allergic rhinitis: Secondary | ICD-10-CM

## 2017-01-28 ENCOUNTER — Ambulatory Visit (INDEPENDENT_AMBULATORY_CARE_PROVIDER_SITE_OTHER): Payer: Medicare HMO | Admitting: Allergy & Immunology

## 2017-01-28 ENCOUNTER — Encounter: Payer: Self-pay | Admitting: Allergy & Immunology

## 2017-01-28 VITALS — BP 122/74 | HR 65 | Resp 17

## 2017-01-28 DIAGNOSIS — J3089 Other allergic rhinitis: Secondary | ICD-10-CM | POA: Diagnosis not present

## 2017-01-28 DIAGNOSIS — J4540 Moderate persistent asthma, uncomplicated: Secondary | ICD-10-CM

## 2017-01-28 DIAGNOSIS — J0100 Acute maxillary sinusitis, unspecified: Secondary | ICD-10-CM | POA: Diagnosis not present

## 2017-01-28 NOTE — Patient Instructions (Addendum)
1. Moderate persistent asthma - Daily controller medication(s): Symbicort 160/4.5 two puffs twice daily - Rescue medications: Ventolin 4 puffs every 4-6 hours as needed or albuterol nebulizer one vial puffs every 4-6 hours as needed - Asthma control goals:  * Full participation in all desired activities (may need albuterol before activity) * Albuterol use two time or less a week on average (not counting use with activity) * Cough interfering with sleep two time or less a month * Oral steroids no more than once a year * No hospitalizations  2. Allergic rhinitis - Continue with Astelin nasal spray two sprays per nostril once daily. - Continue with ipratropium nasal spray one spray per nostril every 6 hours as needed. - Continue with Claritin 10mg  daily.   3. Acute maxillary sinusitis - With your current symptoms and time course, antibiotics are not needed.  - If symptoms are not improving in 1-2 days, feel free to call us or email me, at [name] and we can send in an antibiotic at that time.  - Add on nasal saline spray (i.e., Simply Saline) or nasal saline lavage (i.e., NeilMed) as needed prior to medicated nasal sprays. - Start the steroid pack provided to help with inflammation.   4. Return in about 3 months (around 04/29/2017).   Please inform us of any Emergency Department visits, hospitalizations, or changes in symptoms. Call us before going to the ED for breathing or allergy symptoms since we might be able to fit you in for a sick visit. Feel free to contact us anytime with any questions, problems, or concerns.  It was a pleasure to see you again today! Enjoy the upcoming fall season!  Websites that have reliable patient information: 1. American Academy of Asthma, Allergy, and Immunology: www.aaaai.org 2. Food Allergy Research and Education (FARE): foodallergy.org 3. Mothers of Asthmatics: http://www.asthmacommunitynetwork.org 4. American College of Allergy,  Asthma, and Immunology: www.acaai.org   Election Day is coming up on Tuesday, November 6th! Make your voice heard! Register to vote at [email or web address]!

## 2017-01-28 NOTE — Progress Notes (Signed)
FOLLOW UP  Date of Service/Encounter:  01/28/17   Assessment:   Moderate persistent asthma without complication  Acute non-recurrent maxillary sinusitis  Allergic rhinitis   Asthma Reportables:  Severity: moderate persistent  Risk: low Control: well controlled   Plan/Recommendations:   1. Moderate persistent asthma - Daily controller medication(s): Symbicort 160/4.5 two puffs twice daily - Rescue medications: Ventolin 4 puffs every 4-6 hours as needed or albuterol nebulizer one vial puffs every 4-6 hours as needed - Asthma control goals:  * Full participation in all desired activities (may need albuterol before activity) * Albuterol use two time or less a week on average (not counting use with activity) * Cough interfering with sleep two time or less a month * Oral steroids no more than once a year * No hospitalizations  2. Allergic rhinitis - Continue with Astelin nasal spray two sprays per nostril once daily. - Continue with ipratropium nasal spray one spray per nostril every 6 hours as needed. - Continue with Claritin 10mg  daily.   3. Acute maxillary sinusitis - With your current symptoms and time course, antibiotics are not needed.  - If symptoms are not improving in 1-2 days, feel free to call us or email me, at [email or web address] and we can send in an antibiotic at that time.  - Add on nasal saline spray (i.e., Simply Saline) or nasal saline lavage (i.e., NeilMed) as needed prior to medicated nasal sprays. - Start the steroid pack provided to help with inflammation.   4. Return in about 3 months (around 04/29/2017).  Subjective:   Angelica [name] is a 76 y.o. female presenting today for follow up of  Chief Complaint  Patient presents with  . Nasal Congestion  . Cough    Angelica [name] has a history of the following: Patient Active Problem List   Diagnosis Date Noted  . Acute sinusitis 08/28/2016  . Moderate persistent asthma 03/20/2016   . Other allergic rhinitis 03/20/2016  . Cough, persistent 03/20/2016  . Hypoglycemia 04/17/2015  . Small bowel obstruction ([place])   . Generalized abdominal pain   . Abdominal pain   . Malnutrition of moderate degree 04/11/2015  . SBO (small bowel obstruction) ([place]) 04/10/2015  . HTN (hypertension) 04/10/2015    History obtained from: chart review and patient.  [facility] Primary Care Provider is Angelica Spurling, MD.     [name] is a 76 y.o. female presenting for a follow up visit. She was last seen in August 2018 by Dr. Verlin Fester. At that time, she was changed back to Symbicort from [facility] due to a problem with a bad taste. Her allergic rhinitis was treated with nasal Atrovent as well as Astelin nasal spray.   Since the last visit, she has not done well. She is having nasal drainage and pressure for five days. She is using nose sprays and Mucinex without much improvement. Symptoms have been getting progressively worse, although she has not developed a fever. He has been using her antihistamine nasal spray as well as her ipratropium nasal spray, again without much improvement.   Despite this, her asthma has remained under good control. She is on Symbicort 160/4.5 two puffs twice daily as well as albuterol as needed. Her summer has gone well, although she did not go on any major trips. She did make it to the beach once.   Otherwise, there have been no changes to her past medical history, surgical history, family history, or social history.    Review  of Systems: a 14-point review of systems is pertinent for what is mentioned in HPI.  Otherwise, all other systems were negative. Constitutional: negative other than that listed in the HPI Eyes: negative other than that listed in the HPI Ears, nose, mouth, throat, and face: negative other than that listed in the HPI Respiratory: negative other than that listed in the HPI Cardiovascular: negative other than that listed in the  HPI Gastrointestinal: negative other than that listed in the HPI Genitourinary: negative other than that listed in the HPI Integument: negative other than that listed in the HPI Hematologic: negative other than that listed in the HPI Musculoskeletal: negative other than that listed in the HPI Neurological: negative other than that listed in the HPI Allergy/Immunologic: negative other than that listed in the HPI    Objective:   Blood pressure 122/74, pulse 65, resp. rate 17, SpO2 93 %. There is no height or weight on file to calculate BMI.   Physical Exam:  General: Alert, interactive, in no acute distress. Pleasant.  Eyes: No conjunctival injection present on the right, No conjunctival injection present on the left, PERRL bilaterally, No discharge on the right, No discharge on the left, No Horner-Trantas dots present and allergic shiners present bilaterally  Ears: Right TM pearly gray with normal light reflex, Left TM pearly gray with normal light reflex, Right TM intact without perforation and Left TM intact without perforation.  Nose/Throat: External nose within normal limits, nasal crease present and septum midline, turbinates edematous and pale with thick discharge, post-pharynx erythematous with cobblestoning in the posterior oropharynx. Tonsils 2+ without exudates Neck: Supple without thyromegaly. Lungs: Clear to auscultation without wheezing, rhonchi or rales. No increased work of breathing. CV: Normal S1/S2, no murmurs. Capillary refill <2 seconds.  Skin: Warm and dry, without lesions or rashes. Neuro:   Grossly intact. No focal deficits appreciated. Responsive to questions.   Diagnostic studies:   Spirometry: results normal (FEV1: 1.51/85%, FVC: 2.21/96%, FEV1/FVC: 68%).    Spirometry consistent with normal pattern. Albuterol nebulizer treatment given in clinic with no improvement.  Allergy Studies: none    Salvatore Marvel, MD [facility]

## 2017-02-03 ENCOUNTER — Encounter (HOSPITAL_COMMUNITY): Payer: Self-pay | Admitting: Emergency Medicine

## 2017-02-03 DIAGNOSIS — I10 Essential (primary) hypertension: Secondary | ICD-10-CM | POA: Diagnosis not present

## 2017-02-03 DIAGNOSIS — J45909 Unspecified asthma, uncomplicated: Secondary | ICD-10-CM | POA: Insufficient documentation

## 2017-02-03 DIAGNOSIS — R1084 Generalized abdominal pain: Secondary | ICD-10-CM | POA: Diagnosis not present

## 2017-02-03 DIAGNOSIS — K5904 Chronic idiopathic constipation: Secondary | ICD-10-CM | POA: Diagnosis not present

## 2017-02-03 DIAGNOSIS — Z79899 Other long term (current) drug therapy: Secondary | ICD-10-CM | POA: Insufficient documentation

## 2017-02-03 DIAGNOSIS — Z87891 Personal history of nicotine dependence: Secondary | ICD-10-CM | POA: Insufficient documentation

## 2017-02-03 DIAGNOSIS — Z91010 Allergy to peanuts: Secondary | ICD-10-CM | POA: Insufficient documentation

## 2017-02-03 LAB — COMPREHENSIVE METABOLIC PANEL
ALT: 12 U/L — ABNORMAL LOW (ref 14–54)
AST: 19 U/L (ref 15–41)
Albumin: 3.8 g/dL (ref 3.5–5.0)
Alkaline Phosphatase: 67 U/L (ref 38–126)
Anion gap: 11 (ref 5–15)
BUN: 19 mg/dL (ref 6–20)
CO2: 20 mmol/L — ABNORMAL LOW (ref 22–32)
Calcium: 8.9 mg/dL (ref 8.9–10.3)
Chloride: 100 mmol/L — ABNORMAL LOW (ref 101–111)
Creatinine, Ser: 0.93 mg/dL (ref 0.44–1.00)
GFR calc Af Amer: >60 mL/min (ref >60)
GFR calc non Af Amer: 58 mL/min — ABNORMAL LOW (ref >60)
Glucose, Bld: 98 mg/dL (ref 65–99)
Potassium: 3.4 mmol/L — ABNORMAL LOW (ref 3.5–5.1)
Sodium: 131 mmol/L — ABNORMAL LOW (ref 135–145)
Total Bilirubin: 0.4 mg/dL (ref 0.3–1.2)
Total Protein: 7.2 g/dL (ref 6.5–8.1)

## 2017-02-03 LAB — CBC
HCT: 40.7 % (ref 36.0–46.0)
Hemoglobin: 13.9 g/dL (ref 12.0–15.0)
MCH: 29.9 pg (ref 26.0–34.0)
MCHC: 34.2 g/dL (ref 30.0–36.0)
MCV: 87.5 fL (ref 78.0–100.0)
Platelets: UNDETERMINED 10*3/uL (ref 150–400)
RBC: 4.65 MIL/uL (ref 3.87–5.11)
RDW: 14.2 % (ref 11.5–15.5)
WBC: 10.7 10*3/uL — ABNORMAL HIGH (ref 4.0–10.5)

## 2017-02-03 LAB — LIPASE, BLOOD: Lipase: 38 U/L (ref 11–51)

## 2017-02-03 NOTE — ED Triage Notes (Signed)
C/o R sided abd pain and feeling bloated that got worse today.  States she normally has problems with constipation but seems worse today.  Last BM this morning.  Denies nausea and vomiting.

## 2017-02-04 ENCOUNTER — Emergency Department (HOSPITAL_COMMUNITY): Payer: Medicare HMO

## 2017-02-04 ENCOUNTER — Emergency Department (HOSPITAL_COMMUNITY)
Admission: EM | Admit: 2017-02-04 | Discharge: 2017-02-04 | Disposition: A | Discharge status: Home / Self Care | Payer: Medicare HMO | Attending: Emergency Medicine | Admitting: Emergency Medicine

## 2017-02-04 DIAGNOSIS — K5904 Chronic idiopathic constipation: Secondary | ICD-10-CM

## 2017-02-04 DIAGNOSIS — R109 Unspecified abdominal pain: Secondary | ICD-10-CM

## 2017-02-04 LAB — URINALYSIS, ROUTINE W REFLEX MICROSCOPIC
Bilirubin Urine: NEGATIVE
Glucose, UA: NEGATIVE mg/dL
Hgb urine dipstick: NEGATIVE
Ketones, ur: NEGATIVE mg/dL
Leukocytes, UA: NEGATIVE
Nitrite: NEGATIVE
Protein, ur: NEGATIVE mg/dL
Specific Gravity, Urine: 1.003 — ABNORMAL LOW (ref 1.005–1.030)
pH: 6 (ref 5.0–8.0)

## 2017-02-04 MED ORDER — IOPAMIDOL (ISOVUE-300) INJECTION 61%
INTRAVENOUS | Status: AC
  Administered 2017-02-04: 100 mL
  Filled 2017-02-04: qty 100

## 2017-02-04 NOTE — ED Notes (Signed)
Pt states she is leaving.  Apologized for wait and explained how many people are in front of her and how many discharges and admissions.  Pt states she will wait a little longer.

## 2017-02-04 NOTE — Discharge Instructions (Signed)
Work with your doctor to manage your constipation so you don't have to take [place] every day. Miralax and Sen[place]ot are two medications for constipation that are safe to take every day.  Return if you are having any problems.

## 2017-02-04 NOTE — ED Provider Notes (Signed)
[facility] DEPT Provider Note   CSN: [id number] Arrival date & time: 02/03/17  2127     History   Chief Complaint Chief Complaint  Patient presents with  . Abdominal Pain  . Constipation    HPI Angelica [name] is a 76 y.o. female.  The history is provided by the patient.  She comes in because of generalized abdominal pain. She states that she had a hard bowel movement that she had to strain at passing. Following that, she developed generalized abdominal pain which was worse across the upper abdomen. There is no associated nausea or vomiting. She does feel that she completely evacuated her bowels, and has had a lot of gas since then. She has been passing flatus successfully, but only a small amount. Pain was initially 9/10, but has resolved to where it is barely present currently. Of note, she takes milk of magnesia every day to help her bowels move. She does have history of small bowel obstruction in the past.  Past Medical History:  Diagnosis Date  . Asthma   . Bowel obstruction ([place])   . Environmental allergies   . GERD (gastroesophageal reflux disease)   . H. pylori infection   . Hypertension     Patient Active Problem List   Diagnosis Date Noted  . Acute sinusitis 08/28/2016  . Moderate persistent asthma 03/20/2016  . Other allergic rhinitis 03/20/2016  . Cough, persistent 03/20/2016  . Hypoglycemia 04/17/2015  . Small bowel obstruction ([place])   . Generalized abdominal pain   . Abdominal pain   . Malnutrition of moderate degree 04/11/2015  . SBO (small bowel obstruction) ([place]) 04/10/2015  . HTN (hypertension) 04/10/2015    Past Surgical History:  Procedure Laterality Date  . ABDOMINAL HYSTERECTOMY    . BREAST BIOPSY    . COLON SURGERY    . KNEE SURGERY    . LAPAROTOMY N/A 04/18/2015   Procedure: Exploratory laparotomy with lysis of adhesions, possible bowel resection;  Surgeon: Ralene Ok, MD;  Location: [facility];  Service: General;  Laterality: N/A;    . NASAL SINUS SURGERY    . THYROID SURGERY      OB History    No data available       Home Medications    Prior to Admission medications   Medication Sig Start Date End Date Taking? Authorizing Provider  acetaminophen (TYLENOL) 500 MG tablet Take 500 mg by mouth daily as needed for mild pain, moderate pain, fever or headache. For pain    [provider]  albuterol (PROVENTIL) (2.5 MG/3ML) 0.083% nebulizer solution Take 3 mLs (2.5 mg total) by nebulization 2 (two) times daily as needed for wheezing or shortness of breath. 07/05/16   Bobbitt, Sedalia Muta, MD  albuterol (VENTOLIN HFA) 108 (90 Base) MCG/ACT inhaler Inhale 2 puffs into the lungs every 4 (four) hours as needed for wheezing or shortness of breath. 08/28/16   Bobbitt, Sedalia Muta, MD  azelastine (ASTELIN) 0.1 % nasal spray Place 2 sprays into both nostrils 2 (two) times daily. 12/31/16   Bobbitt, Sedalia Muta, MD  budesonide-formoterol [facility]) 160-4.5 MCG/ACT inhaler Inhale 2 puffs into the lungs 2 (two) times daily.    [provider]  Cholecalciferol (VITAMIN D3 PO) Take 1,000 mg by mouth daily at 12 noon.    [provider]  Guaifenesin 1200 MG TB12 Take 1 tablet (1,200 mg total) by mouth 2 (two) times daily. 06/08/16   Lawyer, Harrell Gave, PA-C  ipratropium (ATROVENT) 0.06 % nasal spray USE  ONE SPRAY(S) IN EACH NOSTRIL THREE TIMES DAILY 01/10/17   Bobbitt, Sedalia Muta, MD  loratadine (CLARITIN) 10 MG tablet Take 10 mg by mouth daily as needed. For allergy symptoms    [provider]  losartan-hydrochlorothiazide (HYZAAR) 100-12.5 MG per tablet Take 1 tablet by mouth daily.    [provider]  omeprazole (PRILOSEC) 40 MG capsule Take 40 mg by mouth daily as needed. For acid reflux    [provider]    Family History Family History  Problem Relation Age of Onset  . Allergic rhinitis Neg Hx   . Angioedema Neg Hx   . Asthma Neg Hx   . Eczema Neg Hx   .  Immunodeficiency Neg Hx   . Urticaria Neg Hx     Social History Social History  Substance Use Topics  . Smoking status: Former Smoker    Types: Cigarettes  . Smokeless tobacco: Never Used  . Alcohol use No     Allergies   Asa buff (mag [buffered aspirin]; Fish allergy; Other; Peanut-containing drug products; Penicillins; Shellfish-derived products; and Strawberry extract   Review of Systems Review of Systems  All other systems reviewed and are negative.    Physical Exam Updated Vital Signs BP 120/69 (BP Location: Left Arm)   Pulse 65   Temp 98 F (36.7 C) (Oral)   Resp 16   Ht 5\' 5"  (1.651 m)   Wt 70.4 kg (155 lb 5 oz)   SpO2 100%   BMI 25.85 kg/m   Physical Exam  Nursing note and vitals reviewed.  76 year old female, resting comfortably and in no acute distress. Vital signs are normal. Oxygen saturation is 100%, which is normal. Head is normocephalic and atraumatic. PERRLA, EOMI. Oropharynx is clear. Neck is nontender and supple without adenopathy or JVD. Back is nontender and there is no CVA tenderness. Lungs are clear without rales, wheezes, or rhonchi. Chest is nontender. Heart has regular rate and rhythm without murmur. Abdomen is soft, flat, with mild right upper quadrant tenderness. There is no rebound or guarding. Aortic pulsations are palpable, but aorta is nontender and without bruit. There are no masses or hepatosplenomegaly and peristalsis is normoactive. Extremities have no cyanosis or edema, full range of motion is present. Skin is warm and dry without rash. Neurologic: Mental status is normal, cranial nerves are intact, there are no motor or sensory deficits.  ED Treatments / Results  Labs (all labs ordered are listed, but only abnormal results are displayed) Labs Reviewed  COMPREHENSIVE METABOLIC PANEL - Abnormal; Notable for the following:       Result Value   Sodium 131 (*)    Potassium 3.4 (*)    Chloride 100 (*)    CO2 20 (*)    ALT 12  (*)    GFR calc non Af Amer 58 (*)    All other components within normal limits  CBC - Abnormal; Notable for the following:    WBC 10.7 (*)    All other components within normal limits  URINALYSIS, ROUTINE W REFLEX MICROSCOPIC - Abnormal; Notable for the following:    Color, Urine COLORLESS (*)    Specific Gravity, Urine 1.003 (*)    All other components within normal limits  LIPASE, BLOOD   Radiology Ct Abdomen Pelvis W Contrast  Result Date: 02/04/2017 CLINICAL DATA:  Abdominal pain since yesterday, constipation. Assess for diverticulitis. History of bowel obstruction, colon surgery, hysterectomy. EXAM: CT ABDOMEN AND PELVIS WITH CONTRAST TECHNIQUE: Multidetector CT  imaging of the abdomen and pelvis was performed using the standard protocol following bolus administration of intravenous contrast. CONTRAST:  116mL ISOVUE-300 IOPAMIDOL (ISOVUE-300) INJECTION 61% COMPARISON:  CT abdomen and pelvis April 26, 2015 FINDINGS: LOWER CHEST: Lung bases are clear. Included heart size is normal. No pericardial effusion. HEPATOBILIARY: Liver and gallbladder are normal. PANCREAS: Normal. SPLEEN: Normal. ADRENALS/URINARY TRACT: Kidneys are orthotopic, demonstrating symmetric enhancement. No nephrolithiasis, hydronephrosis or solid renal masses. The unopacified ureters are normal in course and caliber. Delayed imaging through the kidneys demonstrates symmetric prompt contrast excretion within the proximal urinary collecting system. Urinary bladder is partially distended and unremarkable. Normal adrenal glands. STOMACH/BOWEL: The stomach, small and large bowel are normal in course and caliber without inflammatory changes, decreased sensitivity for pathology without contrast. Mild colonic diverticulosis. Moderate amount of retained large bowel stool. The appendix is not discretely identified, however there are no inflammatory changes in the right lower quadrant. VASCULAR/LYMPHATIC: Aortoiliac vessels are normal in  course and caliber. Mild calcific atherosclerosis. No lymphadenopathy by CT size criteria. REPRODUCTIVE: Status post hysterectomy. OTHER: No intraperitoneal free fluid or free air. MUSCULOSKELETAL: Nonacute. Small fat containing supraumbilical ventral hernia. Bilateral gluteal injection granulomas. Anterior abdominal wall scarring. Grade 1 L4-5 anterolisthesis without spondylolysis. Severe lower lumbar facet arthropathy. 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 Severe L4-5 and L5-S1 neural foraminal narrowing. IMPRESSION: 1. Moderate amount of retained large bowel stool without bowel obstruction or acute intra-abdominal/pelvic process. 2. 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 Severe L4-5 and L5-S1 neural foraminal narrowing. Aortic Atherosclerosis (ICD10-I70.0). Electronically Signed   By: Elon Alas M.D.   On: 02/04/2017 06:19    Procedures Procedures (including critical care time)  Medications Ordered in ED Medications - No data to display   Initial Impression / Assessment and Plan / ED Course  I have reviewed the triage vital signs and the nursing notes.  Pertinent labs & imaging results that were available during my care of the patient were reviewed by me and considered in my medical decision making (see chart for details).  Abdominal pain in patient with chronic constipation. Old records reviewed confirming episode of small bowel obstruction 2 years ago. WBC is mildly elevated. We'll send for CT of abdomen and pelvis. I've discussed with the patient dangers of using stimulant laxatives on a daily basis.   CT shows moderate amount of stool, but no acute process. Patient is resting comfortably and denies pain at this point. She is discharged with instructions to work with her PCP on chronic management of her constipation to try to avoid use of stimulant laxatives. Return precautions discussed.  Final Clinical Impressions(s) / ED Diagnoses   Final diagnoses:  Abdominal pain, unspecified  abdominal location  Chronic idiopathic constipation    New Prescriptions New Prescriptions   No medications on file     Delora Fuel, MD 73/22/02 [phone]

## 2017-02-07 ENCOUNTER — Encounter (HOSPITAL_COMMUNITY): Payer: Self-pay | Admitting: Emergency Medicine

## 2017-02-07 DIAGNOSIS — K5900 Constipation, unspecified: Secondary | ICD-10-CM | POA: Insufficient documentation

## 2017-02-07 DIAGNOSIS — J45909 Unspecified asthma, uncomplicated: Secondary | ICD-10-CM | POA: Diagnosis not present

## 2017-02-07 DIAGNOSIS — Z91010 Allergy to peanuts: Secondary | ICD-10-CM | POA: Diagnosis not present

## 2017-02-07 DIAGNOSIS — Z87891 Personal history of nicotine dependence: Secondary | ICD-10-CM | POA: Insufficient documentation

## 2017-02-07 DIAGNOSIS — I10 Essential (primary) hypertension: Secondary | ICD-10-CM | POA: Diagnosis not present

## 2017-02-07 DIAGNOSIS — Z79899 Other long term (current) drug therapy: Secondary | ICD-10-CM | POA: Diagnosis not present

## 2017-02-07 NOTE — ED Triage Notes (Signed)
Patient arrives with continued abdominal discomfort. States was seen on Sunday for the same. Workup completed then and she was sent home with stool softener. States she hasn't had a bowel movement of any substance in 4 days.

## 2017-02-08 ENCOUNTER — Emergency Department (HOSPITAL_COMMUNITY)
Admission: EM | Admit: 2017-02-08 | Discharge: 2017-02-08 | Disposition: A | Discharge status: Home / Self Care | Payer: Medicare HMO | Attending: Emergency Medicine | Admitting: Emergency Medicine

## 2017-02-08 DIAGNOSIS — K5900 Constipation, unspecified: Secondary | ICD-10-CM

## 2017-02-08 MED ORDER — POLYETHYLENE GLYCOL 3350 17 G PO PACK
17.0000 g | PACK | Freq: Every day | ORAL | Status: DC
  Administered 2017-02-08: 17 g via ORAL
  Filled 2017-02-08: qty 1

## 2017-02-08 NOTE — ED Provider Notes (Signed)
[facility] DEPT Provider Note   CSN: [id number] Arrival date & time: 02/07/17  1959     History   Chief Complaint Chief Complaint  Patient presents with  . Constipation  . Abdominal Pain    HPI Angelica [name] is a 76 y.o. female.   She presents for evaluation of ongoing constipation, improved with Colace, which she started several days ago, after being evaluated with CT imaging.  She has been able to eat and drink well.  She had a small bowel movement yesterday.  She has some nausea but no vomiting.  She denies fever, chills, weakness or dizziness.  When she was evaluated recently she was taking stimulant laxatives, and has been advised to stop taking.  She states that she has stopped the stimulant laxative use.  There are no other known modifying factors.  HPI  Past Medical History:  Diagnosis Date  . Asthma   . Bowel obstruction ([place])   . Environmental allergies   . GERD (gastroesophageal reflux disease)   . H. pylori infection   . Hypertension     Patient Active Problem List   Diagnosis Date Noted  . Acute sinusitis 08/28/2016  . Moderate persistent asthma 03/20/2016  . Other allergic rhinitis 03/20/2016  . Cough, persistent 03/20/2016  . Hypoglycemia 04/17/2015  . Small bowel obstruction ([place])   . Generalized abdominal pain   . Abdominal pain   . Malnutrition of moderate degree 04/11/2015  . SBO (small bowel obstruction) ([place]) 04/10/2015  . HTN (hypertension) 04/10/2015    Past Surgical History:  Procedure Laterality Date  . ABDOMINAL HYSTERECTOMY    . BREAST BIOPSY    . COLON SURGERY    . KNEE SURGERY    . LAPAROTOMY N/A 04/18/2015   Procedure: Exploratory laparotomy with lysis of adhesions, possible bowel resection;  Surgeon: Ralene Ok, MD;  Location: [facility];  Service: General;  Laterality: N/A;  . NASAL SINUS SURGERY    . THYROID SURGERY      OB History    No data available       Home Medications    Prior to Admission medications    Medication Sig Start Date End Date Taking? Authorizing Provider  acetaminophen (TYLENOL) 500 MG tablet Take 500 mg by mouth daily as needed for mild pain, moderate pain, fever or headache. For pain    [provider]  albuterol (PROVENTIL) (2.5 MG/3ML) 0.083% nebulizer solution Take 3 mLs (2.5 mg total) by nebulization 2 (two) times daily as needed for wheezing or shortness of breath. 07/05/16   Bobbitt, Sedalia Muta, MD  albuterol (VENTOLIN HFA) 108 (90 Base) MCG/ACT inhaler Inhale 2 puffs into the lungs every 4 (four) hours as needed for wheezing or shortness of breath. 08/28/16   Bobbitt, Sedalia Muta, MD  azelastine (ASTELIN) 0.1 % nasal spray Place 2 sprays into both nostrils 2 (two) times daily. 12/31/16   Bobbitt, Sedalia Muta, MD  budesonide-formoterol [facility]) 160-4.5 MCG/ACT inhaler Inhale 2 puffs into the lungs 2 (two) times daily.    [provider]  Cholecalciferol (VITAMIN D3 PO) Take 1,000 mg by mouth daily at 12 noon.    [provider]  Guaifenesin 1200 MG TB12 Take 1 tablet (1,200 mg total) by mouth 2 (two) times daily. 06/08/16   Lawyer, Harrell Gave, PA-C  ipratropium (ATROVENT) 0.06 % nasal spray USE ONE SPRAY(S) IN EACH NOSTRIL THREE TIMES DAILY 01/10/17   Bobbitt, Sedalia Muta, MD  loratadine (CLARITIN) 10 MG tablet Take 10 mg by mouth  daily as needed. For allergy symptoms    [provider]  losartan-hydrochlorothiazide (HYZAAR) 100-12.5 MG per tablet Take 1 tablet by mouth daily.    [provider]  omeprazole (PRILOSEC) 40 MG capsule Take 40 mg by mouth daily as needed. For acid reflux    [provider]    Family History Family History  Problem Relation Age of Onset  . Allergic rhinitis Neg Hx   . Angioedema Neg Hx   . Asthma Neg Hx   . Eczema Neg Hx   . Immunodeficiency Neg Hx   . Urticaria Neg Hx     Social History Social History  Substance Use Topics  . Smoking status: Former Smoker    Types: Cigarettes    . Smokeless tobacco: Never Used  . Alcohol use No     Allergies   Asa buff (mag [buffered aspirin]; Fish allergy; Other; Peanut-containing drug products; Penicillins; Shellfish-derived products; and Strawberry extract   Review of Systems Review of Systems  All other systems reviewed and are negative.    Physical Exam Updated Vital Signs BP 115/71 (BP Location: Right Arm)   Pulse 62   Temp 97.7 F (36.5 C) (Oral)   Resp 16   Ht 5\' 5"  (1.651 m)   Wt 70.3 kg (155 lb)   SpO2 100%   BMI 25.79 kg/m   Physical Exam  Constitutional: She is oriented to person, place, and time. She appears well-developed. No distress.  Elderly, frail  HENT:  Head: Normocephalic and atraumatic.  Eyes: Pupils are equal, round, and reactive to light. Conjunctivae and EOM are normal.  Neck: Normal range of motion and phonation normal. Neck supple.  Cardiovascular: Normal rate and regular rhythm.   Pulmonary/Chest: Effort normal and breath sounds normal. She exhibits no tenderness.  Abdominal: Soft. She exhibits no distension. There is no tenderness. There is no guarding.  Genitourinary:  Genitourinary Comments: Rectal exam-normal anus.  Small amount of brown stool in rectal vault.  No rectal mass, no fecal impaction.  Musculoskeletal: Normal range of motion.  Neurological: She is alert and oriented to person, place, and time. She exhibits normal muscle tone.  Skin: Skin is warm and dry.  Psychiatric: She has a normal mood and affect. Her behavior is normal. Judgment and thought content normal.  Nursing note and vitals reviewed.    ED Treatments / Results  Labs (all labs ordered are listed, but only abnormal results are displayed) Labs Reviewed - No data to display  EKG  EKG Interpretation None       Radiology No results found.  Procedures Procedures (including critical care time)  Medications Ordered in ED Medications  polyethylene glycol (MIRALAX / GLYCOLAX) packet 17 g (not  administered)     Initial Impression / Assessment and Plan / ED Course  I have reviewed the triage vital signs and the nursing notes.  Pertinent labs & imaging results that were available during my care of the patient were reviewed by me and considered in my medical decision making (see chart for details).      Patient Vitals for the past 24 hrs:  BP Temp Temp src Pulse Resp SpO2 Height Weight  02/08/17 0648 115/71 - - 62 16 100 % - -  02/08/17 0559 117/70 97.7 F (36.5 C) Oral 65 17 100 % - -  02/07/17 2052 (!) 143/66 97.6 F (36.4 C) Oral 66 18 100 % 5\' 5"  (1.651 m) 70.3 kg (155 lb)    7:43 AM Reevaluation  with update and discussion. After initial assessment and treatment, an updated evaluation reveals no change in clinical status.  She remains comfortable and stable.  Findings discussed with the patient and all questions answered. Alixandra Alfieri L      Final Clinical Impressions(s) / ED Diagnoses   Final diagnoses:  Constipation, unspecified constipation type    Evaluation is consistent with constipation without other complicating features.  Suspect long-term use of stimulant laxatives, is preventing good bowel motility.  The patient is nontoxic, and has no indication for surgical abdomen, significant dehydration or suspected electrolyte abnormality.  Nursing Notes Reviewed/ Care Coordinated Applicable Imaging Reviewed Interpretation of Laboratory Data incorporated into ED treatment  The patient appears reasonably screened and/or stabilized for discharge and I doubt any other medical condition or other [facility] requiring further screening, evaluation, or treatment in the ED at this time prior to discharge.  Plan: Home Medications-changes stool softener to MiraLAX and use every 4 hours until stooling, then gradually decrease, as needed.  Continue routine medications; Home Treatments-rest, fluids, fiber; return here if the recommended treatment, does not improve the symptoms;  Recommended follow up-PCP 1 week and as needed   New Prescriptions New Prescriptions   No medications on file     Daleen Bo, MD 02/08/17 [phone]

## 2017-02-08 NOTE — Discharge Instructions (Signed)
You will need to take a stronger stool softener to improve your stooling.  Use MiraLAX 1 capful in 8 ounces of juice, every 4 hours until you have a bowel movement, then 3 times a day for 1 day, then 2 times a day for 1 day, and continue once a day for 1 month.  Make sure that you are drinking plenty of fluids, and eating a high-fiber diet.

## 2017-02-08 NOTE — ED Notes (Signed)
Otila Kluver (granddaughter)  [phone] wants to be called when patient is roomed

## 2017-04-09 ENCOUNTER — Ambulatory Visit: Payer: Medicare HMO | Admitting: Allergy and Immunology

## 2017-04-09 ENCOUNTER — Encounter: Payer: Self-pay | Admitting: Allergy and Immunology

## 2017-04-09 VITALS — BP 122/76 | HR 72 | Resp 16

## 2017-04-09 DIAGNOSIS — J3089 Other allergic rhinitis: Secondary | ICD-10-CM

## 2017-04-09 DIAGNOSIS — J324 Chronic pansinusitis: Secondary | ICD-10-CM | POA: Diagnosis not present

## 2017-04-09 DIAGNOSIS — J339 Nasal polyp, unspecified: Secondary | ICD-10-CM

## 2017-04-09 DIAGNOSIS — J4541 Moderate persistent asthma with (acute) exacerbation: Secondary | ICD-10-CM | POA: Diagnosis not present

## 2017-04-09 MED ORDER — METHYLPREDNISOLONE ACETATE 80 MG/ML IJ SUSP
80.0000 mg | Freq: Once | INTRAMUSCULAR | Status: AC
  Administered 2017-04-09: 80 mg via INTRAMUSCULAR

## 2017-04-09 MED ORDER — MONTELUKAST SODIUM 10 MG PO TABS: 10.0000 mg | ORAL_TABLET | Freq: Every day | ORAL | 5 refills | Status: DC

## 2017-04-09 NOTE — Progress Notes (Signed)
Follow-up Note  Referring Provider: Foye Spurling, MD Primary Provider: Foye Spurling, MD Date of Office Visit: 04/09/2017  Subjective:   Angelica [name] (DOB: 11-20-1940) is a 76 y.o. female who returns to the [facility] on 04/09/2017 in re-evaluation of the following:  HPI: Angelica [name] presents to this clinic in evaluation of persistent respiratory tract symptoms.  I have never seen her in this clinic but she has a long history of being followed in this clinic for recurrent problems with asthma and upper airway symptoms and recurrent infections of her sinuses.  Most recently she has developed nasal congestion and sneezing and coughing and green nasal discharge over the course of the past several days.  She had to use her nebulized bronchodilator this week.  She states that she has chronic problems with recurrent infections about every 2 months or so involving her respiratory tract.  She has persistent nasal congestion especially on the right side and she cannot smell at all.  Her asthma appears to be under pretty good control though she does develop intermittent bouts of coughing for which she must use a bronchodilator but this does not appear to be more than twice a week.  All this is occurring while continuing to use anti-inflammatory medications for her lungs.  Allergies as of 04/09/2017      Reactions   Asa Buff (mag [buffered Aspirin] Other (See Comments)   Excessive sweating   Fish Allergy Other (See Comments)   Unknown reaction   Other Other (See Comments)   Gelcaps; gel-containing capsules; extended release tablets   Peanut-containing Drug Products Other (See Comments)   From allergy test   Penicillins Itching   Has patient had a PCN reaction causing immediate rash, facial/tongue/throat swelling, SOB or lightheadedness with hypotension: No Has patient had a PCN reaction causing severe rash involving mucus membranes or skin necrosis: No Has patient had a  PCN reaction that required hospitalization No Has patient had a PCN reaction occurring within the last 10 years: No If all of the above answers are "NO", then may proceed with Cephalosporin use.   Shellfish-derived Products Other (See Comments)   From allergy test   Strawberry Extract Other (See Comments)   Worsened allergy symptoms      Medication List      acetaminophen 500 MG tablet Commonly known as:  TYLENOL Take 500 mg by mouth daily as needed for mild pain, moderate pain, fever or headache. For pain   albuterol (2.5 MG/3ML) 0.083% nebulizer solution Commonly known as:  PROVENTIL Take 3 mLs (2.5 mg total) by nebulization 2 (two) times daily as needed for wheezing or shortness of breath.   albuterol 108 (90 Base) MCG/ACT inhaler Commonly known as:  VENTOLIN HFA Inhale 2 puffs into the lungs every 4 (four) hours as needed for wheezing or shortness of breath.   azelastine 0.1 % nasal spray Commonly known as:  ASTELIN Place 2 sprays into both nostrils 2 (two) times daily.   budesonide-formoterol 160-4.5 MCG/ACT inhaler Commonly known as:  SYMBICORT Inhale 2 puffs into the lungs 2 (two) times daily.   fluticasone 50 MCG/ACT nasal spray Commonly known as:  FLONASE   Guaifenesin 1200 MG Tb12 Take 1 tablet (1,200 mg total) by mouth 2 (two) times daily.   ipratropium 0.06 % nasal spray Commonly known as:  ATROVENT USE ONE SPRAY(S) IN EACH NOSTRIL THREE TIMES DAILY   loratadine 10 MG tablet Commonly known as:  CLARITIN Take 10  mg by mouth daily as needed. For allergy symptoms   losartan-hydrochlorothiazide 100-12.5 MG tablet Commonly known as:  HYZAAR Take 1 tablet by mouth daily.   omeprazole 40 MG capsule Commonly known as:  PRILOSEC Take 40 mg by mouth daily as needed. For acid reflux   VITAMIN D3 PO Take 1,000 mg by mouth daily at 12 noon.       Past Medical History:  Diagnosis Date  . Asthma   . Bowel obstruction ([place])   . Environmental allergies   .  GERD (gastroesophageal reflux disease)   . H. pylori infection   . Hypertension     Past Surgical History:  Procedure Laterality Date  . ABDOMINAL HYSTERECTOMY    . BREAST BIOPSY    . COLON SURGERY    . KNEE SURGERY    . LAPAROTOMY N/A 04/18/2015   Procedure: Exploratory laparotomy with lysis of adhesions, possible bowel resection;  Surgeon: Ralene Ok, MD;  Location: [facility];  Service: General;  Laterality: N/A;  . NASAL SINUS SURGERY    . THYROID SURGERY      Review of systems negative except as noted in HPI / PMHx or noted below:  Review of Systems  Constitutional: Negative.   HENT: Negative.   Eyes: Negative.   Respiratory: Negative.   Cardiovascular: Negative.   Gastrointestinal: Negative.   Genitourinary: Negative.   Musculoskeletal: Negative.   Skin: Negative.   Neurological: Negative.   Endo/Heme/Allergies: Negative.   Psychiatric/Behavioral: Negative.      Objective:   Vitals:   04/09/17 1622  BP: 122/76  Pulse: 72  Resp: 16  SpO2: 97%          Physical Exam  Constitutional: She is well-developed, well-nourished, and in no distress.  Nasal voice, coughing  HENT:  Head: Normocephalic.  Right Ear: Tympanic membrane, external ear and ear canal normal.  Left Ear: Tympanic membrane, external ear and ear canal normal.  Nose: Mucosal edema (Right nasal polyposis) present. No rhinorrhea.  Mouth/Throat: Uvula is midline, oropharynx is clear and moist and mucous membranes are normal. No oropharyngeal exudate.  Eyes: Conjunctivae are normal.  Neck: Trachea normal. No tracheal tenderness present. No tracheal deviation present. No thyromegaly present.  Cardiovascular: Normal rate, regular rhythm, S1 normal, S2 normal and normal heart sounds.  No murmur heard. Pulmonary/Chest: Breath sounds normal. No stridor. No respiratory distress. She has no wheezes. She has no rales.  Musculoskeletal: She exhibits no edema.  Lymphadenopathy:       Head (right side):  No tonsillar adenopathy present.       Head (left side): No tonsillar adenopathy present.    She has no cervical adenopathy.  Neurological: She is alert. Gait normal.  Skin: No rash noted. She is not diaphoretic. No erythema. Nails show no clubbing.  Psychiatric: Mood and affect normal.    Diagnostics:    Spirometry was performed and demonstrated an FEV1 of 1.75 at 101 % of predicted.  The patient had an Asthma Control Test with the following results: ACT Total Score: 21.    Results of a chest x-ray obtained 08 June 2016 identified the following:  The heart size and mediastinal contours are within normal limits. Both lungs are clear. The visualized skeletal structures are unremarkable. Multiple tiny surgical clips in the right side of the neck consistent with prior thyroid or parathyroid surgery  Assessment and Plan:   1. Moderate persistent asthma with acute exacerbation   2. Other allergic rhinitis   3. Chronic pansinusitis  4. Nasal polyposis     1. Symbicort 160 - 2 inhalations twice a day  2.  OTC Nasacort / Flonase -1 spray each nostril twice a day  3.  Montelukast 10mg  -1 tablet 1 time per day  4.  Depo-Medrol 80 IM delivered in clinic today  5.  If needed:   A.  Ventolin HFA 2 puffs or albuterol nebulization every 4-6 hours  B.  OTC antihistamine and OTC Mucinex DM  6.  Obtain blood - CBC w/diff, IgA/G/M, IgE, ANCA w/reflex  7.  Obtain sinus CT scan  8.  Return to clinic in 3 weeks or earlier if problem  Angelica [name] has an inflamed and irritated respiratory tract and she may have a chronic infection.  Given the presence of nasal polyposis I think we need to define her upper airway anatomy in more detail and sort out whether or not there is evidence of chronic sinusitis contributing to her symptoms including her anosmia.  As well, we will rule out a B-cell immunodeficiency contributing to her recurrent bouts of infections.  She will utilize anti-inflammatory  agents for her respiratory tract on a consistent basis as noted above.  I will regroup with her in 3 weeks or earlier if there is a problem.  Allena Katz, MD Allergy / Immunology [place]

## 2017-04-09 NOTE — Patient Instructions (Addendum)
  1. Symbicort 160 - 2 inhalations twice a day  2.  OTC Nasacort / Flonase -1 spray each nostril twice a day  3.  Montelukast 10mg  -1 tablet 1 time per day  4.  Depo-Medrol 80 IM delivered in clinic today  5.  If needed:   A.  Ventolin HFA 2 puffs or albuterol nebulization every 4-6 hours  B.  OTC antihistamine and OTC Mucinex DM  6.  Obtain blood - CBC w/diff, IgA/G/M, IgE, ANCA w/ reflex  7.  Obtain sinus CT scan  8.  Return to clinic in 3 weeks or earlier if problem

## 2017-04-10 ENCOUNTER — Telehealth: Payer: Self-pay | Admitting: Allergy and Immunology

## 2017-04-10 ENCOUNTER — Encounter: Payer: Self-pay | Admitting: Allergy and Immunology

## 2017-04-10 DIAGNOSIS — J4541 Moderate persistent asthma with (acute) exacerbation: Secondary | ICD-10-CM

## 2017-04-10 MED ORDER — MONTELUKAST SODIUM 10 MG PO TABS: 10.0000 mg | ORAL_TABLET | Freq: Every day | ORAL | 5 refills | Status: DC

## 2017-04-10 NOTE — Telephone Encounter (Signed)
rx sent to [facility] on [address]

## 2017-04-10 NOTE — Telephone Encounter (Signed)
Patient was seen and had medications called in yesterday Patient wanted to make sure her scripts were called into the [facility] on [address] Please call patient with any questions

## 2017-04-13 LAB — PAN-ANCA
ANCA Proteinase 3: <3.5 U/mL (ref 0.0–3.5)
Atypical pANCA: <1:20 {titer}
C-ANCA: <1:20 {titer}
Myeloperoxidase Ab: <9 U/mL (ref 0.0–9.0)
P-ANCA: <1:20 {titer}

## 2017-04-13 LAB — CBC WITH DIFFERENTIAL/PLATELET
Basophils Absolute: 0 10*3/uL (ref 0.0–0.2)
Basos: 1 %
EOS (ABSOLUTE): 0.7 10*3/uL — ABNORMAL HIGH (ref 0.0–0.4)
Eos: 8 %
Hematocrit: 39.8 % (ref 34.0–46.6)
Hemoglobin: 13.1 g/dL (ref 11.1–15.9)
Immature Grans (Abs): 0 10*3/uL (ref 0.0–0.1)
Immature Granulocytes: 0 %
Lymphocytes Absolute: 3.6 10*3/uL — ABNORMAL HIGH (ref 0.7–3.1)
Lymphs: 41 %
MCH: 29.8 pg (ref 26.6–33.0)
MCHC: 32.9 g/dL (ref 31.5–35.7)
MCV: 91 fL (ref 79–97)
Monocytes Absolute: 0.5 10*3/uL (ref 0.1–0.9)
Monocytes: 6 %
Neutrophils Absolute: 3.9 10*3/uL (ref 1.4–7.0)
Neutrophils: 44 %
Platelets: 355 10*3/uL (ref 150–379)
RBC: 4.4 x10E6/uL (ref 3.77–5.28)
RDW: 14 % (ref 12.3–15.4)
WBC: 8.8 10*3/uL (ref 3.4–10.8)

## 2017-04-13 LAB — IGG, IGA, IGM
IgA/Immunoglobulin A, Serum: 288 mg/dL (ref 64–422)
IgG (Immunoglobin G), Serum: 1918 mg/dL — ABNORMAL HIGH (ref 700–1600)
IgM (Immunoglobulin M), Srm: 69 mg/dL (ref 26–217)

## 2017-04-13 LAB — IGE: IgE (Immunoglobulin E), Serum: 314 IU/mL — ABNORMAL HIGH (ref 0–100)

## 2017-04-17 ENCOUNTER — Telehealth: Payer: Self-pay | Admitting: *Deleted

## 2017-04-17 NOTE — Telephone Encounter (Signed)
Received call from [place] advised needs PA for patient sinus CT scheduled for 11/30. Obtained PA and faxed to them

## 2017-04-19 ENCOUNTER — Ambulatory Visit
Admission: RE | Admit: 2017-04-19 | Discharge: 2017-04-19 | Disposition: A | Discharge status: Home / Self Care | Payer: Medicare HMO | Source: Ambulatory Visit | Attending: Allergy and Immunology | Admitting: Allergy and Immunology

## 2017-04-19 IMAGING — CT CT MAXILLOFACIAL W/O CM
2 of 3 series · 16 of 37 positions shown, 20 images · non-contrast
Comparison: None.

CLINICAL DATA: Chronic sinusitis. Pain and pressure and frontal
sinus and maxillary sinus regions.

EXAM:
CT MAXILLOFACIAL WITHOUT CONTRAST
TECHNIQUE: Multidetector CT imaging of the maxillofacial structures was
performed. Multiplanar CT image reconstructions were also generated.

[Series 601: coronal facial · coronal · 0.30mm/px · 3 of 77 slices shown]
[im 26/77  bone]
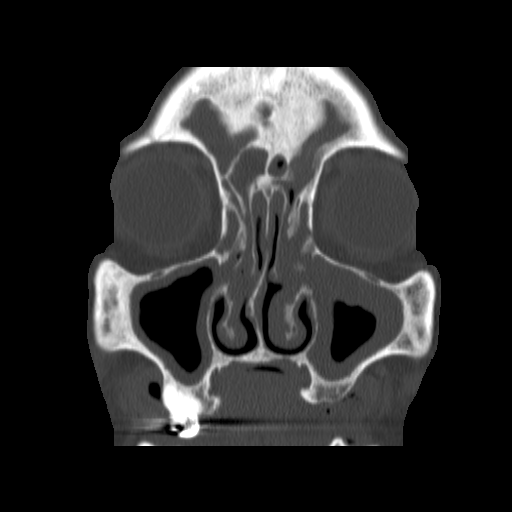
[im 39/77  bone]
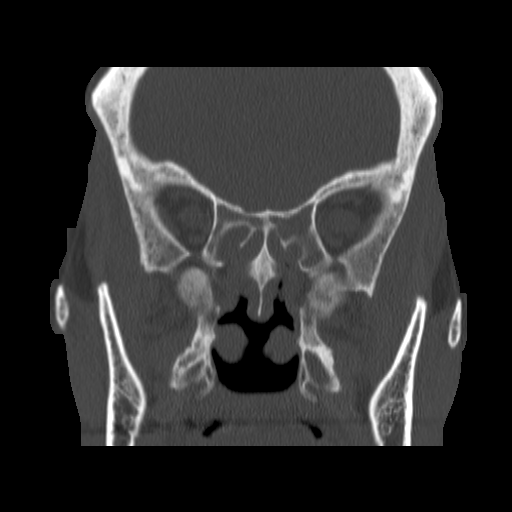
[im 51/77  bone]
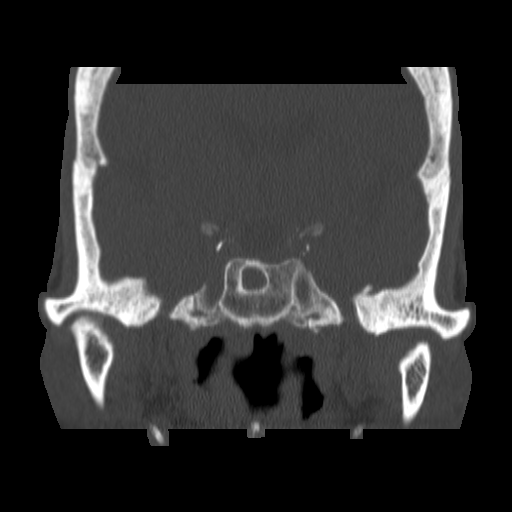

[Series 602: sagittal facial · sagittal · 0.30mm/px · 13 of 74 slices shown, 17 images]
[im 5/74  brain]
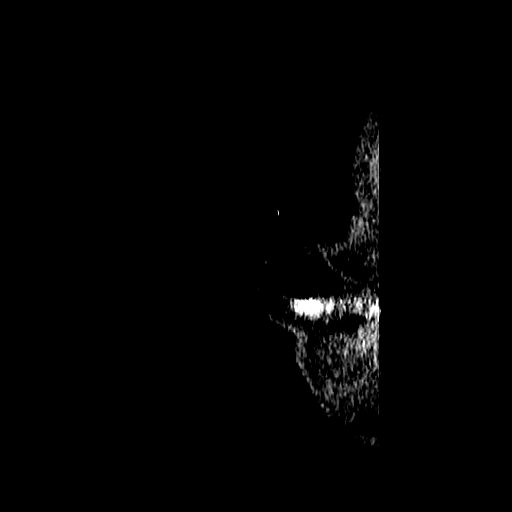
[im 5/74  bone]
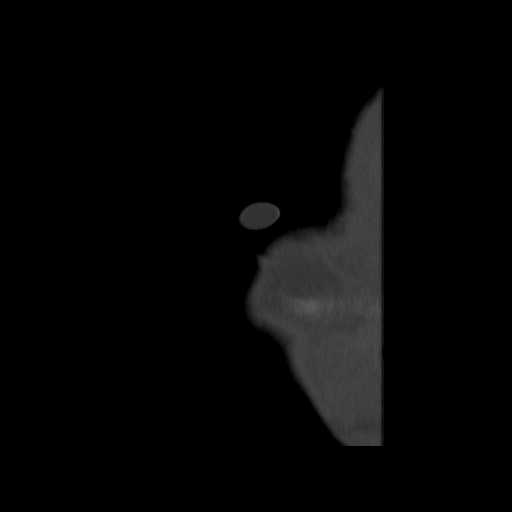
[im 9/74  bone]
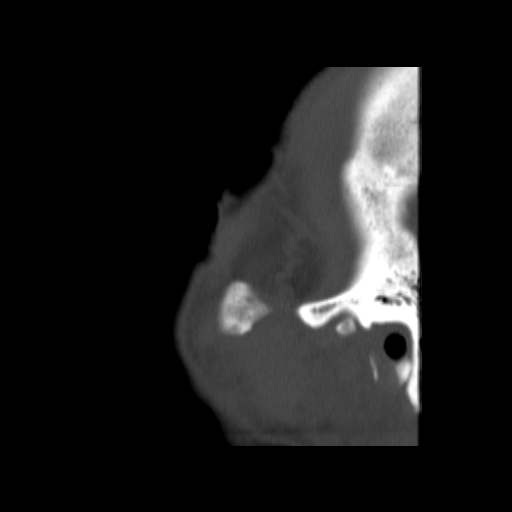
[im 18/74  bone]
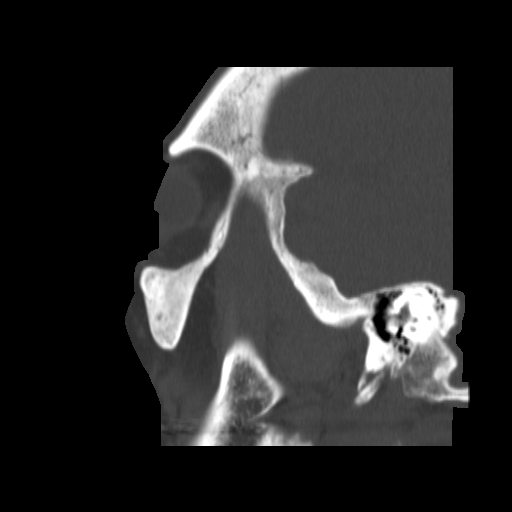
[im 22/74  bone]
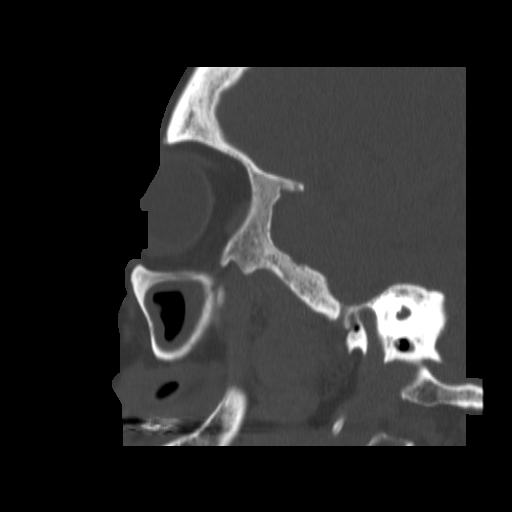
[im 26/74  brain]
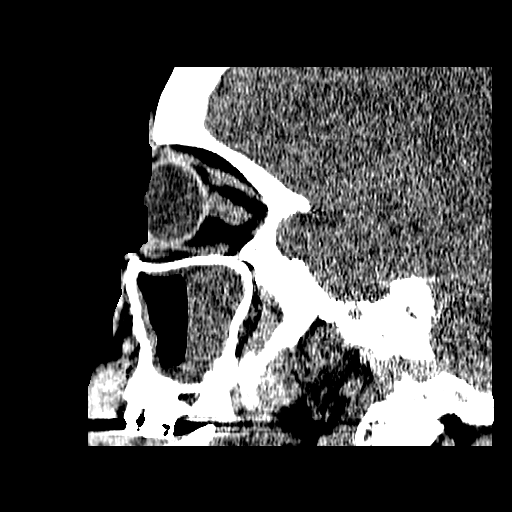
[im 26/74  bone]
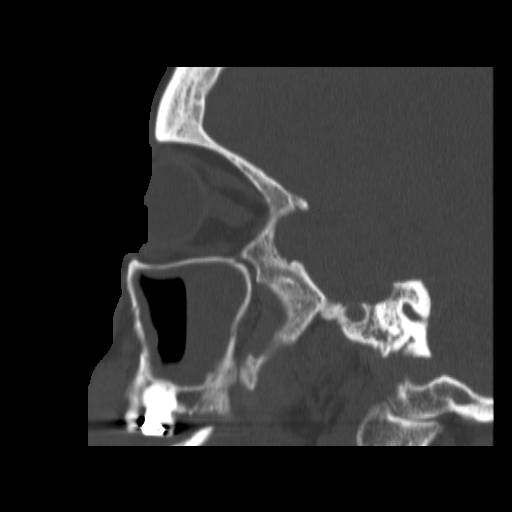
[im 31/74  bone]
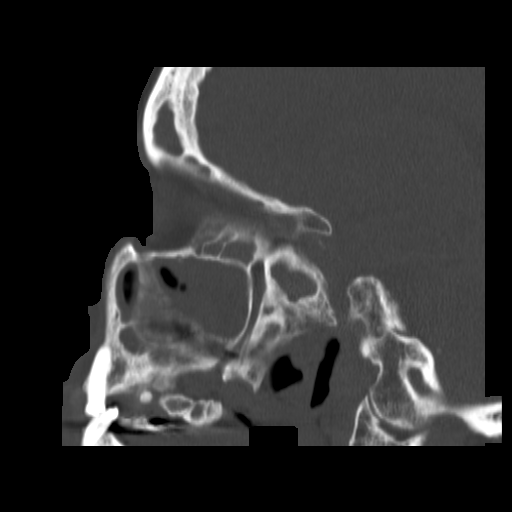
[im 39/74  bone]
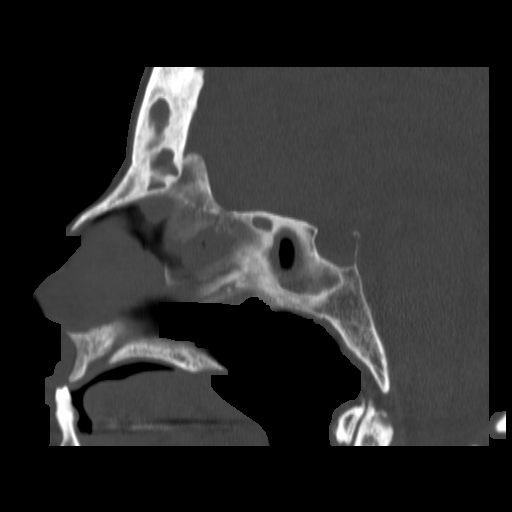
[im 43/74  bone]
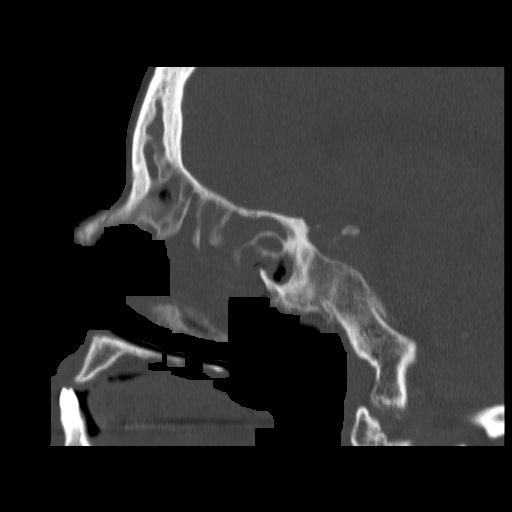
[im 48/74  brain]
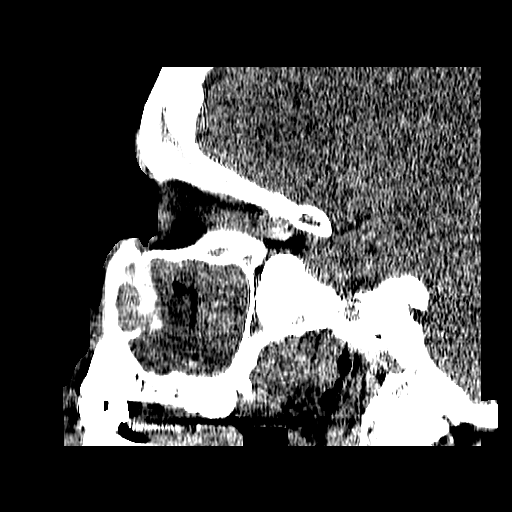
[im 48/74  bone]
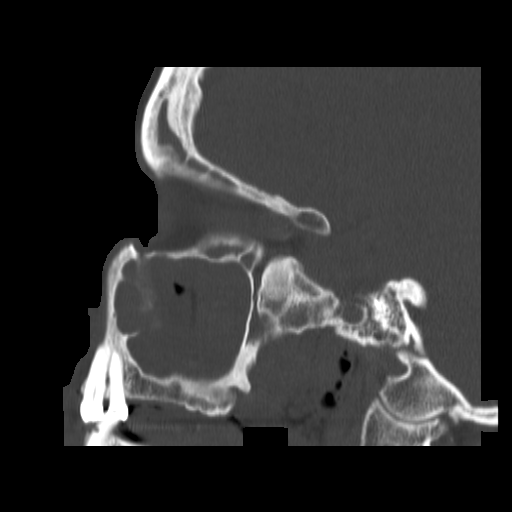
[im 52/74  bone]
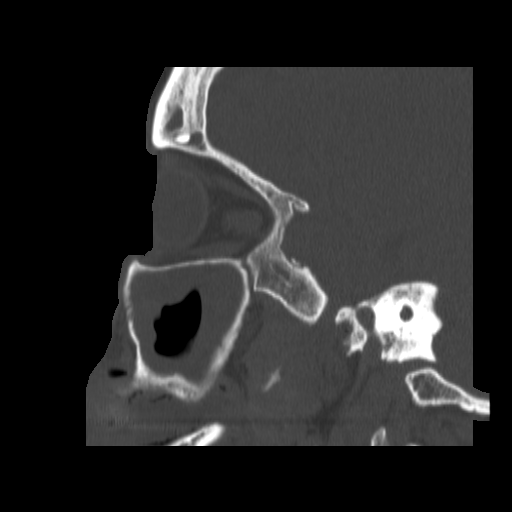
[im 56/74  bone]
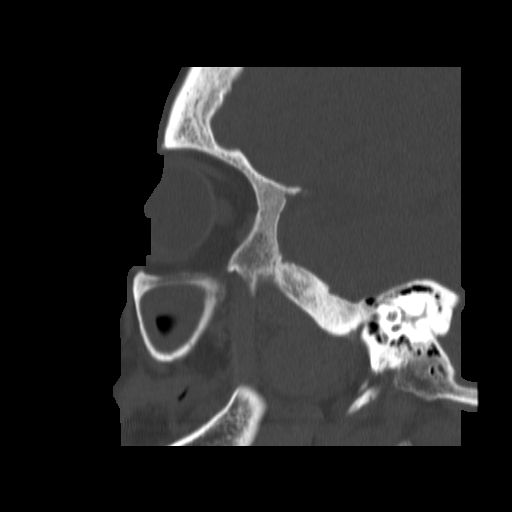
[im 65/74  bone]
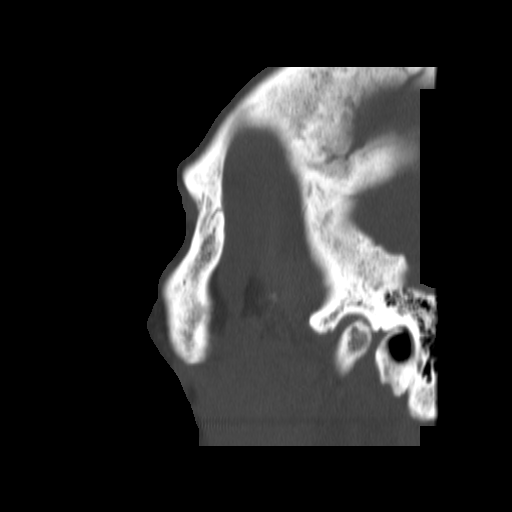
[im 69/74  brain]
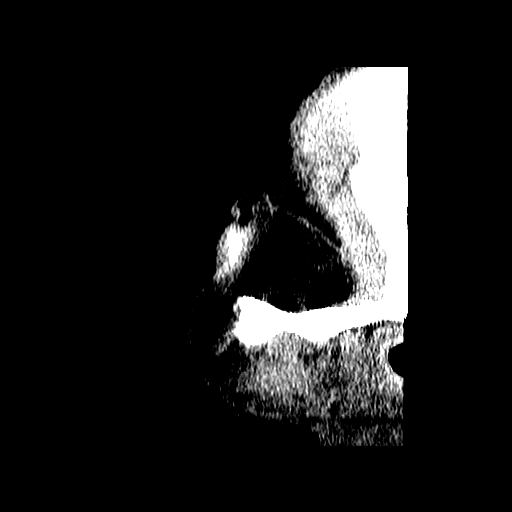
[im 69/74  bone]
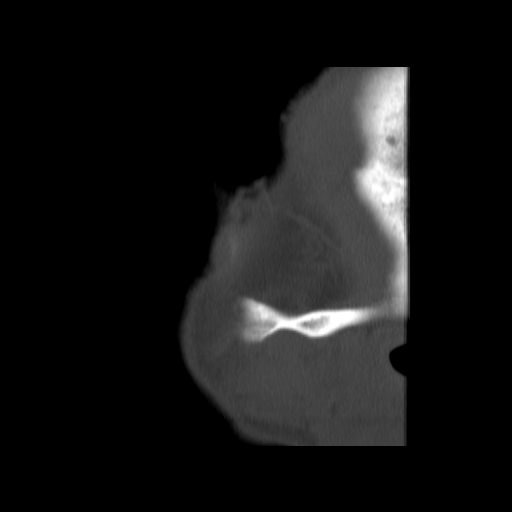

[16 of 37 positions shown; findings below may reference images not displayed]

FINDINGS: Near complete opacification of the frontal sinuses and ethmoid air
cells. Extensive mucosal thickening throughout the maxillary sinuses
and sphenoid sinuses as well with air-fluid levels noted in these
sinuses. Findings compatible with extensive acute on chronic
sinusitis.

Orbital soft tissues are unremarkable.  No acute bony abnormality.
IMPRESSION: Severe pansinusitis with acute on chronic changes.

## 2017-04-22 ENCOUNTER — Other Ambulatory Visit: Payer: Self-pay

## 2017-04-22 ENCOUNTER — Telehealth: Payer: Self-pay | Admitting: *Deleted

## 2017-04-22 MED ORDER — CLINDAMYCIN HCL 150 MG PO CAPS: 150.0000 mg | ORAL_CAPSULE | Freq: Three times a day (TID) | ORAL | 0 refills | Status: AC

## 2017-04-22 NOTE — Telephone Encounter (Signed)
Patient advised of chronic sinusitis per Dr Neldon Mc from Sinus Ct she had done and Rx for antibiotic Clindamycin 150mg  TID for 20 days- Rx sent

## 2017-04-30 ENCOUNTER — Telehealth: Payer: Self-pay | Admitting: Allergy and Immunology

## 2017-04-30 ENCOUNTER — Ambulatory Visit: Payer: Medicare HMO | Admitting: Allergy and Immunology

## 2017-04-30 NOTE — Telephone Encounter (Signed)
Pt daughter called and needs to talk with you about the labs ct scan . You were suppose to talk with them today but she can not make it here. And I can get her in until jan to see bobbitt [phone].

## 2017-05-06 ENCOUNTER — Telehealth: Payer: Self-pay | Admitting: Allergy and Immunology

## 2017-05-06 ENCOUNTER — Ambulatory Visit: Payer: Medicare HMO | Admitting: Allergy & Immunology

## 2017-05-06 NOTE — Telephone Encounter (Signed)
Called number on file and spoke with Taron, pt's daughter. According to her chart, pt had a CT scan and lab work done at the end of November. Results of these have already been discussed with the pt. The pt's daughter had questions regarding the [place]. Sent [facility] a staff message to speak with the pt's daughter regarding her concerns.

## 2017-05-06 NOTE — Telephone Encounter (Signed)
Patient would like to be called with her results. She said she has polyps and would like to know results.

## 2017-05-07 ENCOUNTER — Telehealth: Payer: Self-pay | Admitting: *Deleted

## 2017-05-07 NOTE — Telephone Encounter (Signed)
Called patient and talked to daughter.  They advised never received the first application I mailed so I will mail info and application again

## 2017-05-07 NOTE — Telephone Encounter (Signed)
-----   Message from [place], [place] sent at 05/06/2017 11:34 AM EST ----- Regarding: Angelica [name] with pt's daughter. She states that the [place] application was never received in the mail. Her daughter would like to speak with you more about the Fasenra Injection. I informed her that, according to Dr. Neldon Mc, it was to help relieve her respiratory symptoms. Pt lives with her daughter, [name]. Please call the number on file to speak with Taron.

## 2017-05-09 ENCOUNTER — Encounter: Payer: Self-pay | Admitting: Internal Medicine

## 2017-05-09 ENCOUNTER — Telehealth: Payer: Self-pay

## 2017-05-09 ENCOUNTER — Other Ambulatory Visit (INDEPENDENT_AMBULATORY_CARE_PROVIDER_SITE_OTHER): Payer: Medicare HMO

## 2017-05-09 ENCOUNTER — Ambulatory Visit: Payer: Medicare HMO | Admitting: Internal Medicine

## 2017-05-09 VITALS — BP 112/78 | HR 71 | Temp 97.7°F | Ht 65.0 in | Wt 153.0 lb

## 2017-05-09 DIAGNOSIS — H409 Unspecified glaucoma: Secondary | ICD-10-CM | POA: Insufficient documentation

## 2017-05-09 DIAGNOSIS — E2839 Other primary ovarian failure: Secondary | ICD-10-CM

## 2017-05-09 DIAGNOSIS — E538 Deficiency of other specified B group vitamins: Secondary | ICD-10-CM

## 2017-05-09 DIAGNOSIS — Z9109 Other allergy status, other than to drugs and biological substances: Secondary | ICD-10-CM | POA: Insufficient documentation

## 2017-05-09 DIAGNOSIS — E559 Vitamin D deficiency, unspecified: Secondary | ICD-10-CM | POA: Diagnosis not present

## 2017-05-09 DIAGNOSIS — A048 Other specified bacterial intestinal infections: Secondary | ICD-10-CM | POA: Insufficient documentation

## 2017-05-09 DIAGNOSIS — I10 Essential (primary) hypertension: Secondary | ICD-10-CM | POA: Diagnosis not present

## 2017-05-09 DIAGNOSIS — Z0000 Encounter for general adult medical examination without abnormal findings: Secondary | ICD-10-CM

## 2017-05-09 DIAGNOSIS — K219 Gastro-esophageal reflux disease without esophagitis: Secondary | ICD-10-CM | POA: Insufficient documentation

## 2017-05-09 DIAGNOSIS — Z0001 Encounter for general adult medical examination with abnormal findings: Secondary | ICD-10-CM | POA: Insufficient documentation

## 2017-05-09 HISTORY — DX: Unspecified glaucoma: H40.9

## 2017-05-09 LAB — URINALYSIS, ROUTINE W REFLEX MICROSCOPIC
Bilirubin Urine: NEGATIVE
Ketones, ur: NEGATIVE
Leukocytes, UA: NEGATIVE
Nitrite: NEGATIVE
Specific Gravity, Urine: <=1.005 — AB (ref 1.000–1.030)
Total Protein, Urine: NEGATIVE
Urine Glucose: NEGATIVE
Urobilinogen, UA: 0.2 (ref 0.0–1.0)
WBC, UA: NONE SEEN (ref 0–?)
pH: 7 (ref 5.0–8.0)

## 2017-05-09 LAB — CBC WITH DIFFERENTIAL/PLATELET
Basophils Absolute: 0.1 10*3/uL (ref 0.0–0.1)
Basophils Relative: 0.9 % (ref 0.0–3.0)
Eosinophils Absolute: 0.5 10*3/uL (ref 0.0–0.7)
Eosinophils Relative: 5.9 % — ABNORMAL HIGH (ref 0.0–5.0)
HCT: 38.7 % (ref 36.0–46.0)
Hemoglobin: 12.8 g/dL (ref 12.0–15.0)
Lymphocytes Relative: 31 % (ref 12.0–46.0)
Lymphs Abs: 2.8 10*3/uL (ref 0.7–4.0)
MCHC: 33.2 g/dL (ref 30.0–36.0)
MCV: 92.2 fl (ref 78.0–100.0)
Monocytes Absolute: 0.7 10*3/uL (ref 0.1–1.0)
Monocytes Relative: 7.4 % (ref 3.0–12.0)
Neutro Abs: 4.9 10*3/uL (ref 1.4–7.7)
Neutrophils Relative %: 54.8 % (ref 43.0–77.0)
Platelets: 304 10*3/uL (ref 150.0–400.0)
RBC: 4.19 Mil/uL (ref 3.87–5.11)
RDW: 14 % (ref 11.5–15.5)
WBC: 8.9 10*3/uL (ref 4.0–10.5)

## 2017-05-09 LAB — HEPATIC FUNCTION PANEL
ALT: 8 U/L (ref 0–35)
AST: 15 U/L (ref 0–37)
Albumin: 3.9 g/dL (ref 3.5–5.2)
Alkaline Phosphatase: 77 U/L (ref 39–117)
Bilirubin, Direct: 0.1 mg/dL (ref 0.0–0.3)
Total Bilirubin: 0.6 mg/dL (ref 0.2–1.2)
Total Protein: 7.9 g/dL (ref 6.0–8.3)

## 2017-05-09 LAB — BASIC METABOLIC PANEL
BUN: 14 mg/dL (ref 6–23)
CO2: 28 mEq/L (ref 19–32)
Calcium: 9.4 mg/dL (ref 8.4–10.5)
Chloride: 97 mEq/L (ref 96–112)
Creatinine, Ser: 0.75 mg/dL (ref 0.40–1.20)
GFR: 96.27 mL/min (ref >60.00)
Glucose, Bld: 94 mg/dL (ref 70–99)
Potassium: 3.8 mEq/L (ref 3.5–5.1)
Sodium: 134 mEq/L — ABNORMAL LOW (ref 135–145)

## 2017-05-09 LAB — TSH: TSH: 1.15 u[IU]/mL (ref 0.35–4.50)

## 2017-05-09 LAB — LIPID PANEL
Cholesterol: 159 mg/dL (ref 0–200)
HDL: 66.8 mg/dL (ref >39.00)
LDL Cholesterol: 82 mg/dL (ref 0–99)
NonHDL: 92.38
Total CHOL/HDL Ratio: 2
Triglycerides: 54 mg/dL (ref 0.0–149.0)
VLDL: 10.8 mg/dL (ref 0.0–40.0)

## 2017-05-09 LAB — VITAMIN D 25 HYDROXY (VIT D DEFICIENCY, FRACTURES): VITD: 23.62 ng/mL — ABNORMAL LOW (ref 30.00–100.00)

## 2017-05-09 LAB — VITAMIN B12: Vitamin B-12: 118 pg/mL — ABNORMAL LOW (ref 211–911)

## 2017-05-09 NOTE — Telephone Encounter (Signed)
Called pt, LVM.   

## 2017-05-09 NOTE — Assessment & Plan Note (Signed)

## 2017-05-09 NOTE — Assessment & Plan Note (Signed)
stable overall by history and exam, recent data reviewed with pt, and pt to continue medical treatment as before,  to f/u any worsening symptoms or concerns BP Readings from Last 3 Encounters:  05/09/17 112/78  04/09/17 122/76  02/08/17 118/65

## 2017-05-09 NOTE — Patient Instructions (Addendum)
Please schedule the bone density test before leaving today at the scheduling desk (where you check out)  Please continue all other medications as before, and refills have been done if requested.  Please have the pharmacy call with any other refills you may need.  Please continue your efforts at being more active, low cholesterol diet, and weight control.  You are otherwise up to date with prevention measures today.  Please keep your appointments with your specialists as you may have planned  Please go to the LAB in the Basement (turn left off the elevator) for the tests to be done today  You will be contacted by phone if any changes need to be made immediately.  Otherwise, you will receive a letter about your results with an explanation, but please check with MyChart first.  Please remember to sign up for MyChart if you have not done so, as this will be important to you in the future with finding out test results, communicating by private email, and scheduling acute appointments online when needed.  Please return in 6 months, or sooner if needed

## 2017-05-09 NOTE — Telephone Encounter (Signed)
-----   Message from [name], MD sent at 05/09/2017 12:56 PM EST ----- Left message on MyChart, pt to cont same tx except  The test results show that your current treatment is OK, except the Vitamin D and Vitamin B12 are very low.  Please start OTC Vitamin D3 at 2000 units per day.  Please also take an OTC B complex vitamin or B12 vitamin as well.  These are vitamins, but they are VERY important, so be sure to start the vitamins OTC.Marland Kitchen    Angelica [name] to please inform pt, needs to take both vitamins indefinitely

## 2017-05-09 NOTE — Progress Notes (Signed)
Subjective:    Patient ID: Angelica [name], female    DOB: [birth year], 76 y.o.   MRN: [id number]  HPI  Here for wellness and f/u;  Overall doing ok;  Pt denies Chest pain, worsening SOB, DOE, wheezing, orthopnea, PND, worsening LE edema, palpitations, dizziness or syncope.  Pt denies neurological change such as new headache, facial or extremity weakness.  Pt denies polydipsia, polyuria, or low sugar symptoms. Pt states overall good compliance with treatment and medications, good tolerability, and has been trying to follow appropriate diet.  Pt denies worsening depressive symptoms, suicidal ideation or panic. No fever, night sweats, wt loss, loss of appetite, or other constitutional symptoms.  Pt states good ability with ADL's, has low fall risk, home safety reviewed and adequate, no other significant changes in hearing or vision, and only occasionally active with exercise. Declines immunizations today; due for dxa.  S/p SBO surgury x 2 yrs, stll with occasional constipation, better with miralax.  Has seen allergy/asthma Dr Verlin Fester with URI now on antibiotic and allergy meds.  Sees optho every 6 mo, Sees Gi Dr Watt Climes prn.  No other interval hx or complaints Past Medical History:  Diagnosis Date  . Asthma   . Bowel obstruction ([place])   . Environmental allergies   . GERD (gastroesophageal reflux disease)   . Glaucoma 05/09/2017  . H. pylori infection   . Hypertension    Past Surgical History:  Procedure Laterality Date  . ABDOMINAL HYSTERECTOMY    . BREAST BIOPSY    . COLON SURGERY    . KNEE SURGERY    . LAPAROTOMY N/A 04/18/2015   Procedure: Exploratory laparotomy with lysis of adhesions, possible bowel resection;  Surgeon: Ralene Ok, MD;  Location: [facility];  Service: General;  Laterality: N/A;  . NASAL SINUS SURGERY    . THYROID SURGERY      reports that she has quit smoking. Her smoking use included cigarettes. she has never used smokeless tobacco. She reports that she does not drink  alcohol or use drugs. family history is not on file. Allergies  Allergen Reactions  . Asa Buff (Mag [Buffered Aspirin] Other (See Comments)    Excessive sweating  . Fish Allergy Other (See Comments)    Unknown reaction  . Other Other (See Comments)    Gelcaps; gel-containing capsules; extended release tablets  . Peanut-Containing Drug Products Other (See Comments)    From allergy test  . Penicillins Itching    Has patient had a PCN reaction causing immediate rash, facial/tongue/throat swelling, SOB or lightheadedness with hypotension: No Has patient had a PCN reaction causing severe rash involving mucus membranes or skin necrosis: No Has patient had a PCN reaction that required hospitalization No Has patient had a PCN reaction occurring within the last 10 years: No If all of the above answers are "NO", then may proceed with Cephalosporin use.  Marland Kitchen Shellfish-Derived Products Other (See Comments)    From allergy test  . Strawberry Extract Other (See Comments)    Worsened allergy symptoms   Current Outpatient Medications on File Prior to Visit  Medication Sig Dispense Refill  . acetaminophen (TYLENOL) 500 MG tablet Take 500 mg by mouth daily as needed for mild pain, moderate pain, fever or headache. For pain    . albuterol (PROVENTIL) (2.5 MG/3ML) 0.083% nebulizer solution Take 3 mLs (2.5 mg total) by nebulization 2 (two) times daily as needed for wheezing or shortness of breath. 75 mL 0  . albuterol (VENTOLIN HFA) 108 (90  Base) MCG/ACT inhaler Inhale 2 puffs into the lungs every 4 (four) hours as needed for wheezing or shortness of breath. 1 Inhaler 1  . azelastine (ASTELIN) 0.1 % nasal spray Place 2 sprays into both nostrils 2 (two) times daily. 30 mL 5  . budesonide-formoterol (SYMBICORT) 160-4.5 MCG/ACT inhaler Inhale 2 puffs into the lungs 2 (two) times daily.    . Cholecalciferol (VITAMIN D3 PO) Take 1,000 mg by mouth daily at 12 noon.    . clindamycin (CLEOCIN) 150 MG capsule Take 1  capsule (150 mg total) by mouth 3 (three) times daily for 20 days. 60 capsule 0  . fluticasone (FLONASE) 50 MCG/ACT nasal spray     . Guaifenesin 1200 MG TB12 Take 1 tablet (1,200 mg total) by mouth 2 (two) times daily. 20 each 0  . ipratropium (ATROVENT) 0.06 % nasal spray USE ONE SPRAY(S) IN EACH NOSTRIL THREE TIMES DAILY 15 mL 5  . loratadine (CLARITIN) 10 MG tablet Take 10 mg by mouth daily as needed. For allergy symptoms    . losartan-hydrochlorothiazide (HYZAAR) 100-12.5 MG per tablet Take 1 tablet by mouth daily.    . montelukast (SINGULAIR) 10 MG tablet Take 1 tablet (10 mg total) by mouth at bedtime. 30 tablet 5  . omeprazole (PRILOSEC) 40 MG capsule Take 40 mg by mouth daily as needed. For acid reflux     No current facility-administered medications on file prior to visit.    Review of Systems Constitutional: Negative for other unusual diaphoresis, sweats, appetite or weight changes HENT: Negative for other worsening hearing loss, ear pain, facial swelling, mouth sores or neck stiffness.   Eyes: Negative for other worsening pain, redness or other visual disturbance.  Respiratory: Negative for other stridor or swelling Cardiovascular: Negative for other palpitations or other chest pain  Gastrointestinal: Negative for worsening diarrhea or loose stools, blood in stool, distention or other pain Genitourinary: Negative for hematuria, flank pain or other change in urine volume.  Musculoskeletal: Negative for myalgias or other joint swelling.  Skin: Negative for other color change, or other wound or worsening drainage.  Neurological: Negative for other syncope or numbness. Hematological: Negative for other adenopathy or swelling Psychiatric/Behavioral: Negative for hallucinations, other worsening agitation, SI, self-injury, or new decreased concentration All other system neg per pt    Objective:   Physical Exam BP 112/78   Pulse 71   Temp 97.7 F (36.5 C) (Oral)   Ht 5\' 5"  (1.651  m)   Wt 153 lb (69.4 kg)   SpO2 99%   BMI 25.46 kg/m  VS noted,  Constitutional: Pt is oriented to person, place, and time. Appears well-developed and well-nourished, in no significant distress and comfortable Head: Normocephalic and atraumatic  Eyes: Conjunctivae and EOM are normal. Pupils are equal, round, and reactive to light Right Ear: External ear normal without discharge Left Ear: External ear normal without discharge Nose: Nose without discharge or deformity Mouth/Throat: Oropharynx is without other ulcerations and moist  Neck: Normal range of motion. Neck supple. No JVD present. No tracheal deviation present or significant neck LA or mass Cardiovascular: Normal rate, regular rhythm, normal heart sounds and intact distal pulses.   Pulmonary/Chest: WOB normal and breath sounds without rales or wheezing  Abdominal: Soft. Bowel sounds are normal. NT. No HSM  Musculoskeletal: Normal range of motion. Exhibits no edema Lymphadenopathy: Has no other cervical adenopathy.  Neurological: Pt is alert and oriented to person, place, and time. Pt has normal reflexes. No cranial nerve deficit. Motor  grossly intact, Gait intact Skin: Skin is warm and dry. No rash noted or new ulcerations Psychiatric:  Has normal mood and affect. Behavior is normal without agitation No other exam findings Lab Results  Component Value Date   WBC 8.9 05/09/2017   HGB 12.8 05/09/2017   HCT 38.7 05/09/2017   PLT 304.0 05/09/2017   GLUCOSE 94 05/09/2017   CHOL 159 05/09/2017   TRIG 54.0 05/09/2017   HDL 66.80 05/09/2017   LDLCALC 82 05/09/2017   ALT 8 05/09/2017   AST 15 05/09/2017   NA 134 (L) 05/09/2017   K 3.8 05/09/2017   CL 97 05/09/2017   CREATININE 0.75 05/09/2017   BUN 14 05/09/2017   CO2 28 05/09/2017   TSH 1.15 05/09/2017      Assessment & Plan:

## 2017-05-16 ENCOUNTER — Ambulatory Visit (INDEPENDENT_AMBULATORY_CARE_PROVIDER_SITE_OTHER)
Admission: RE | Admit: 2017-05-16 | Discharge: 2017-05-16 | Disposition: A | Discharge status: Home / Self Care | Payer: Medicare HMO | Source: Ambulatory Visit | Attending: Internal Medicine | Admitting: Internal Medicine

## 2017-05-16 DIAGNOSIS — E2839 Other primary ovarian failure: Secondary | ICD-10-CM

## 2017-05-26 ENCOUNTER — Encounter: Payer: Self-pay | Admitting: Internal Medicine

## 2017-05-26 DIAGNOSIS — M8580 Other specified disorders of bone density and structure, unspecified site: Secondary | ICD-10-CM

## 2017-05-26 HISTORY — DX: Other specified disorders of bone density and structure, unspecified site: M85.80

## 2017-05-27 ENCOUNTER — Ambulatory Visit: Payer: Medicare HMO | Admitting: Allergy and Immunology

## 2017-06-04 ENCOUNTER — Ambulatory Visit: Payer: Medicare HMO | Admitting: Allergy and Immunology

## 2017-06-04 ENCOUNTER — Other Ambulatory Visit: Payer: Self-pay | Admitting: Allergy and Immunology

## 2017-06-04 ENCOUNTER — Other Ambulatory Visit: Payer: Self-pay

## 2017-06-04 ENCOUNTER — Encounter: Payer: Self-pay | Admitting: Allergy and Immunology

## 2017-06-04 VITALS — BP 114/74 | HR 74 | Resp 17

## 2017-06-04 DIAGNOSIS — J324 Chronic pansinusitis: Secondary | ICD-10-CM | POA: Diagnosis not present

## 2017-06-04 DIAGNOSIS — D72829 Elevated white blood cell count, unspecified: Secondary | ICD-10-CM | POA: Diagnosis not present

## 2017-06-04 DIAGNOSIS — J4550 Severe persistent asthma, uncomplicated: Secondary | ICD-10-CM | POA: Diagnosis not present

## 2017-06-04 DIAGNOSIS — J339 Nasal polyp, unspecified: Secondary | ICD-10-CM | POA: Diagnosis not present

## 2017-06-04 MED ORDER — BUDESONIDE 0.5 MG/2ML IN SUSP: RESPIRATORY_TRACT | 5 refills | Status: DC

## 2017-06-04 MED ORDER — TIOTROPIUM BROMIDE MONOHYDRATE 1.25 MCG/ACT IN AERS: 2.0000 | INHALATION_SPRAY | Freq: Every day | RESPIRATORY_TRACT | 5 refills | Status: DC

## 2017-06-04 MED ORDER — LOSARTAN POTASSIUM-HCTZ 100-12.5 MG PO TABS: 1.0000 | ORAL_TABLET | Freq: Every day | ORAL | 3 refills | Status: DC

## 2017-06-04 NOTE — Assessment & Plan Note (Signed)
   Continue Symbicort 160-4.5 g, 2 inhalations via spacer device twice daily, montelukast 10 mg daily bedtime, and albuterol HFA, 1-2 inhalations every 4-6 hours if needed.  A prescription has been provided for Spiriva Respimat 1.25 g, 2 inhalations daily.  Paperwork has been submitted to the patient's insurance for Fasenra injections.  Subjective and objective measures of pulmonary function will be followed and the treatment plan will be adjusted accordingly.

## 2017-06-04 NOTE — Progress Notes (Signed)
Follow-up Note  RE: Angelica [name] MRN: [id number] DOB: 06/22/1940 Date of Office Visit: 06/04/2017  Primary care provider: [name], MD Referring provider: [name], MD  History of present illness: Angelica [name] is a 77 y.o. female with persistent asthma, allergic rhinitis, chronic sinusitis, nasal polyposis presenting today for follow-up.  She was previously seen in this clinic on April 09, 2017 by Dr. Neldon Mc.  During her last visit, labs were drawn revealing a leukocytosis and elevated IgG level.  She missed her follow-up appointment in December due to the snow storm.  She is currently taking Symbicort 160-4.5, 2 inhalations via spacer device twice daily, and montelukast 10 mg daily at bedtime.  She feels that her asthma has improved somewhat while taking the montelukast.  She is still experiencing significant nasal congestion despite taking ipratropium nasal spray.  She seems uncertain if she is taking her fluticasone nasal spray.  During her last visit a sinus CT was ordered and interpreted as severe pansinusitis with acute on chronic changes.   Assessment and plan: Severe persistent asthma  Continue Symbicort 160-4.5 g, 2 inhalations via spacer device twice daily, montelukast 10 mg daily bedtime, and albuterol HFA, 1-2 inhalations every 4-6 hours if needed.  A prescription has been provided for Spiriva Respimat 1.25 g, 2 inhalations daily.  Paperwork has been submitted to the patient's insurance for Fasenra injections.  Subjective and objective measures of pulmonary function will be followed and the treatment plan will be adjusted accordingly.  Chronic pansinusitis  Start budesonide/saline nasal irrigation twice a day.  A prescription has been provided for budesonide 0.5 mg respules and instructions for mixing and adminstering the rinse have been discussed and provided in written form.  Continue ipratropium 0.06% nasal spray, 1 spray per nostril every 8 hours  as needed.  Nasal polyposis  The patient will start nasal steroid rinse (as above).  Other allergic rhinitis  Continue appropriate allergen avoidance measures and montelukast 10 mg daily at bedtime.  Start nasal steroid rinse and continue ipratropium nasal spray (as above).  Leukocytosis  Given her CBC results in November 2018, the following labs have been ordered: SPEP with immunofixation and leukemia / lymphoma immunophenotyping profile.   Meds ordered this encounter  Medications  . Tiotropium Bromide Monohydrate (SPIRIVA RESPIMAT) 1.25 MCG/ACT AERS    Sig: Inhale 2 puffs into the lungs daily.    Dispense:  4 g    Refill:  5  . budesonide (PULMICORT) 0.5 MG/2ML nebulizer solution    Sig: 2 mL mixed in nasal irrigation daily as needed.    Dispense:  60 mL    Refill:  5    Diagnostics: Spirometry reveals an FVC of 1.31 L and FEV1 of 1.07 L (62% predicted) with 70 mL (7%) post bronchodilator improvement.  Please see scanned spirometry results for details. Sinus CT (04/19/17): Severe pansinusitis with acute on chronic changes.     Physical examination: Blood pressure 114/74, pulse 74, resp. rate 17, SpO2 98 %.  General: Alert, interactive, in no acute distress. HEENT: TMs pearly gray, turbinates moderately edematous without discharge, nasal polyp visualized on the right side, post-pharynx unremarkable. Neck: Supple without lymphadenopathy. Lungs: Mildly decreased breath sounds bilaterally without wheezing, rhonchi or rales. CV: Normal S1, S2 without murmurs. Skin: Warm and dry, without lesions or rashes.  The following portions of the patient's history were reviewed and updated as appropriate: allergies, current medications, past family history, past medical history, past social history, past surgical  history and problem list.  Allergies as of 06/04/2017      Reactions   Asa Buff (mag [buffered Aspirin] Other (See Comments)   Excessive sweating   Fish Allergy Other  (See Comments)   Unknown reaction   Other Other (See Comments)   Gelcaps; gel-containing capsules; extended release tablets   Peanut-containing Drug Products Other (See Comments)   From allergy test   Penicillins Itching   Has patient had a PCN reaction causing immediate rash, facial/tongue/throat swelling, SOB or lightheadedness with hypotension: No Has patient had a PCN reaction causing severe rash involving mucus membranes or skin necrosis: No Has patient had a PCN reaction that required hospitalization No Has patient had a PCN reaction occurring within the last 10 years: No If all of the above answers are "NO", then may proceed with Cephalosporin use.   Shellfish-derived Products Other (See Comments)   From allergy test   Strawberry Extract Other (See Comments)   Worsened allergy symptoms      Medication List        Accurate as of 06/04/17  7:32 PM. Always use your most recent med list.          acetaminophen 500 MG tablet Commonly known as:  TYLENOL Take 500 mg by mouth daily as needed for mild pain, moderate pain, fever or headache. For pain   albuterol (2.5 MG/3ML) 0.083% nebulizer solution Commonly known as:  PROVENTIL Take 3 mLs (2.5 mg total) by nebulization 2 (two) times daily as needed for wheezing or shortness of breath.   albuterol 108 (90 Base) MCG/ACT inhaler Commonly known as:  VENTOLIN HFA Inhale 2 puffs into the lungs every 4 (four) hours as needed for wheezing or shortness of breath.   azelastine 0.1 % nasal spray Commonly known as:  ASTELIN Place 2 sprays into both nostrils 2 (two) times daily.   budesonide 0.5 MG/2ML nebulizer solution Commonly known as:  PULMICORT 2 mL mixed in nasal irrigation daily as needed.   budesonide-formoterol 160-4.5 MCG/ACT inhaler Commonly known as:  SYMBICORT Inhale 2 puffs into the lungs 2 (two) times daily.   fluticasone 50 MCG/ACT nasal spray Commonly known as:  FLONASE   Guaifenesin 1200 MG Tb12 Take 1 tablet  (1,200 mg total) by mouth 2 (two) times daily.   ipratropium 0.06 % nasal spray Commonly known as:  ATROVENT USE ONE SPRAY(S) IN EACH NOSTRIL THREE TIMES DAILY   loratadine 10 MG tablet Commonly known as:  CLARITIN Take 10 mg by mouth daily as needed. For allergy symptoms   losartan-hydrochlorothiazide 100-12.5 MG tablet Commonly known as:  HYZAAR Take 1 tablet by mouth daily.   montelukast 10 MG tablet Commonly known as:  SINGULAIR Take 1 tablet (10 mg total) by mouth at bedtime.   omeprazole 40 MG capsule Commonly known as:  PRILOSEC Take 40 mg by mouth daily as needed. For acid reflux   Tiotropium Bromide Monohydrate 1.25 MCG/ACT Aers Commonly known as:  SPIRIVA RESPIMAT Inhale 2 puffs into the lungs daily.   VITAMIN D3 PO Take 1,000 mg by mouth daily at 12 noon.       Allergies  Allergen Reactions  . Asa Buff (Mag [Buffered Aspirin] Other (See Comments)    Excessive sweating  . Fish Allergy Other (See Comments)    Unknown reaction  . Other Other (See Comments)    Gelcaps; gel-containing capsules; extended release tablets  . Peanut-Containing Drug Products Other (See Comments)    From allergy test  . Penicillins Itching  Has patient had a PCN reaction causing immediate rash, facial/tongue/throat swelling, SOB or lightheadedness with hypotension: No Has patient had a PCN reaction causing severe rash involving mucus membranes or skin necrosis: No Has patient had a PCN reaction that required hospitalization No Has patient had a PCN reaction occurring within the last 10 years: No If all of the above answers are "NO", then may proceed with Cephalosporin use.  . Shellfish-Derived Products Other (See Comments)    From allergy test  . Strawberry Extract Other (See Comments)    Worsened allergy symptoms   Review of systems: Review of systems negative except as noted in HPI / PMHx or noted below: Constitutional: Negative.  HENT: Negative.   Eyes: Negative.    Respiratory: Negative.   Cardiovascular: Negative.  Gastrointestinal: Negative.  Genitourinary: Negative.  Musculoskeletal: Negative.  Neurological: Negative.  Endo/Heme/Allergies: Negative.  Cutaneous: Negative.  Past Medical History:  Diagnosis Date  . Asthma   . Bowel obstruction ([place])   . Environmental allergies   . GERD (gastroesophageal reflux disease)   . Glaucoma 05/09/2017  . H. pylori infection   . Hypertension   . Osteopenia 05/26/2017    Family History  Problem Relation Age of Onset  . Allergic rhinitis Neg Hx   . Angioedema Neg Hx   . Asthma Neg Hx   . Eczema Neg Hx   . Immunodeficiency Neg Hx   . Urticaria Neg Hx     Social History   Socioeconomic History  . Marital status: Widowed    Spouse name: Not on file  . Number of children: Not on file  . Years of education: Not on file  . Highest education level: Not on file  Social Needs  . Financial resource strain: Not on file  . Food insecurity - worry: Not on file  . Food insecurity - inability: Not on file  . Transportation needs - medical: Not on file  . Transportation needs - non-medical: Not on file  Occupational History  . Not on file  Tobacco Use  . Smoking status: Former Smoker    Types: Cigarettes  . Smokeless tobacco: Never Used  Substance and Sexual Activity  . Alcohol use: No  . Drug use: No  . Sexual activity: No  Other Topics Concern  . Not on file  Social History Narrative  . Not on file    I appreciate the opportunity to take part in Deshundra's care. Please do not hesitate to contact me with questions.  Sincerely,   R. Edgar Frisk, MD

## 2017-06-04 NOTE — Assessment & Plan Note (Signed)
   Continue appropriate allergen avoidance measures and montelukast 10 mg daily at bedtime.  Start nasal steroid rinse and continue ipratropium nasal spray (as above).

## 2017-06-04 NOTE — Assessment & Plan Note (Signed)
   Given her CBC results in November 2018, the following labs have been ordered: SPEP with immunofixation and leukemia / lymphoma immunophenotyping profile.

## 2017-06-04 NOTE — Assessment & Plan Note (Addendum)
   Start budesonide/saline nasal irrigation twice a day.  A prescription has been provided for budesonide 0.5 mg respules and instructions for mixing and adminstering the rinse have been discussed and provided in written form.  Continue ipratropium 0.06% nasal spray, 1 spray per nostril every 8 hours as needed.

## 2017-06-04 NOTE — Telephone Encounter (Signed)
Prescriptions will go to that pharmacy as soon as Dr. Verlin Fester signs off on them.

## 2017-06-04 NOTE — Patient Instructions (Addendum)
Severe persistent asthma  Continue Symbicort 160-4.5 g, 2 inhalations via spacer device twice daily, montelukast 10 mg daily bedtime, and albuterol HFA, 1-2 inhalations every 4-6 hours if needed.  A prescription has been provided for Spiriva Respimat 1.25 g, 2 inhalations daily.  Paperwork has been submitted to the patient's insurance for Fasenra injections.  Subjective and objective measures of pulmonary function will be followed and the treatment plan will be adjusted accordingly.  Chronic pansinusitis  Start budesonide/saline nasal irrigation twice a day.  A prescription has been provided for budesonide 0.5 mg respules and instructions for mixing and adminstering the rinse have been discussed and provided in written form.  Continue ipratropium 0.06% nasal spray, 1 spray per nostril every 8 hours as needed.  Nasal polyposis  The patient will start nasal steroid rinse (as above).  Other allergic rhinitis  Continue appropriate allergen avoidance measures and montelukast 10 mg daily at bedtime.  Start nasal steroid rinse and continue ipratropium nasal spray (as above).  Leukocytosis  Given her CBC results in November 2018, the following labs have been ordered: SPEP with immunofixation and leukemia / lymphoma immunophenotyping profile.   Return in about 2 months (around 08/02/2017), or if symptoms worsen or fail to improve.

## 2017-06-04 NOTE — Assessment & Plan Note (Signed)
   The patient will start nasal steroid rinse (as above).

## 2017-06-04 NOTE — Telephone Encounter (Signed)
Angelica [name] was seen today, 06-04-17, and she would like her prescriptions sent to [facility] [address] instead of previous listed pharmacy. She said there were two prescriptions.

## 2017-06-07 DIAGNOSIS — D72829 Elevated white blood cell count, unspecified: Secondary | ICD-10-CM | POA: Diagnosis not present

## 2017-06-14 LAB — IFE, PE AND FLC, SERUM
Albumin SerPl Elph-Mcnc: 3 g/dL (ref 2.9–4.4)
Albumin/Glob SerPl: 0.7 (ref 0.7–1.7)
Alpha 1: 0.3 g/dL (ref 0.0–0.4)
Alpha2 Glob SerPl Elph-Mcnc: 1 g/dL (ref 0.4–1.0)
B-Globulin SerPl Elph-Mcnc: 1.2 g/dL (ref 0.7–1.3)
Gamma Glob SerPl Elph-Mcnc: 2.2 g/dL — ABNORMAL HIGH (ref 0.4–1.8)
Globulin, Total: 4.7 g/dL — ABNORMAL HIGH (ref 2.2–3.9)
Ig Kappa Free Light Chain: 37 mg/L — ABNORMAL HIGH (ref 3.3–19.4)
Ig Lambda Free Light Chain: 22.4 mg/L (ref 5.7–26.3)
IgA/Immunoglobulin A, Serum: 258 mg/dL (ref 64–422)
IgG (Immunoglobin G), Serum: 1573 mg/dL (ref 700–1600)
IgM (Immunoglobulin M), Srm: 63 mg/dL (ref 26–217)
Kappa/Lambda FluidC Ratio: 1.65 (ref 0.26–1.65)
Total Protein: 7.7 g/dL (ref 6.0–8.5)

## 2017-06-14 LAB — COMP PANEL: LEUKEMIA/LYMPHOMA

## 2017-06-17 ENCOUNTER — Ambulatory Visit (INDEPENDENT_AMBULATORY_CARE_PROVIDER_SITE_OTHER): Payer: Medicare HMO | Admitting: *Deleted

## 2017-06-17 DIAGNOSIS — J4550 Severe persistent asthma, uncomplicated: Secondary | ICD-10-CM

## 2017-06-17 MED ORDER — EPINEPHRINE 0.3 MG/0.3ML IJ SOAJ: INTRAMUSCULAR | 1 refills | Status: DC

## 2017-06-17 MED ORDER — BENRALIZUMAB 30 MG/ML ~~LOC~~ SOSY
30.0000 mg | PREFILLED_SYRINGE | SUBCUTANEOUS | Status: AC
  Administered 2017-06-17 – 2017-08-19 (×3): 30 mg via SUBCUTANEOUS

## 2017-06-17 NOTE — Progress Notes (Signed)
Immunotherapy   Patient Details  Name: Angelica [name] MRN: [id number] Date of Birth: [birth year]  06/17/2017  Angelica [name] started injections for  Fasenra  Frequency:every 4 weeks for 3 doses then every 8 weeks Epi-Pen:Prescription for Epi-Pen given Consent signed and patient instructions given. Patient waited 60 min without difficulty consents signed and Emergency action plan reviewed with patient  Angelica [name] 06/17/2017, 12:25 PM

## 2017-06-21 ENCOUNTER — Telehealth: Payer: Self-pay | Admitting: Allergy and Immunology

## 2017-06-21 NOTE — Telephone Encounter (Signed)
Called and spoke with patient and informed her that we are all out of samples of Spiriva. However if we get one in before the feb10th we will set one aside and give her a call.

## 2017-06-21 NOTE — Telephone Encounter (Signed)
Patient is requesting a sample of Spiriva. She said she cannot get her prescription filled until Feb 10 and she is out.

## 2017-06-24 NOTE — Telephone Encounter (Signed)
Called patient to inform her that we have received a sample of Spiriva Respimat 1.46mcg and it is up front ready for pick up. Patient stated she was on the way to a funeral and would stop by later if she can.

## 2017-07-05 ENCOUNTER — Telehealth: Payer: Self-pay | Admitting: Internal Medicine

## 2017-07-05 NOTE — Telephone Encounter (Signed)
Ok, this is done 

## 2017-07-05 NOTE — Telephone Encounter (Signed)
I have received a request for a statement of physician form. The patient has only ever been seen once and that was in 04/2017. Are you ok with completing form passed off of that or would you like for the patient to make an appointment? Please advise.  Thank you.

## 2017-07-05 NOTE — Telephone Encounter (Signed)
I will place the form in your box for you to review.

## 2017-07-06 ENCOUNTER — Other Ambulatory Visit: Payer: Self-pay

## 2017-07-06 ENCOUNTER — Emergency Department (HOSPITAL_COMMUNITY): Payer: Medicare HMO

## 2017-07-06 ENCOUNTER — Encounter (HOSPITAL_COMMUNITY): Payer: Self-pay | Admitting: Emergency Medicine

## 2017-07-06 ENCOUNTER — Emergency Department (HOSPITAL_COMMUNITY)
Admission: EM | Admit: 2017-07-06 | Discharge: 2017-07-06 | Disposition: A | Discharge status: Home / Self Care | Payer: Medicare HMO | Attending: Emergency Medicine | Admitting: Emergency Medicine

## 2017-07-06 DIAGNOSIS — J45909 Unspecified asthma, uncomplicated: Secondary | ICD-10-CM | POA: Diagnosis not present

## 2017-07-06 DIAGNOSIS — R9431 Abnormal electrocardiogram [ECG] [EKG]: Secondary | ICD-10-CM | POA: Diagnosis not present

## 2017-07-06 DIAGNOSIS — E876 Hypokalemia: Secondary | ICD-10-CM | POA: Diagnosis not present

## 2017-07-06 DIAGNOSIS — R42 Dizziness and giddiness: Secondary | ICD-10-CM | POA: Diagnosis not present

## 2017-07-06 DIAGNOSIS — I10 Essential (primary) hypertension: Secondary | ICD-10-CM | POA: Diagnosis not present

## 2017-07-06 DIAGNOSIS — Z87891 Personal history of nicotine dependence: Secondary | ICD-10-CM | POA: Diagnosis not present

## 2017-07-06 DIAGNOSIS — R4020 Unspecified coma: Secondary | ICD-10-CM | POA: Diagnosis not present

## 2017-07-06 LAB — URINALYSIS, COMPLETE (UACMP) WITH MICROSCOPIC
Bacteria, UA: NONE SEEN
Bilirubin Urine: NEGATIVE
Glucose, UA: NEGATIVE mg/dL
Hgb urine dipstick: NEGATIVE
Ketones, ur: NEGATIVE mg/dL
Leukocytes, UA: NEGATIVE
Nitrite: NEGATIVE
Protein, ur: NEGATIVE mg/dL
Specific Gravity, Urine: 1.004 — ABNORMAL LOW (ref 1.005–1.030)
pH: 6 (ref 5.0–8.0)

## 2017-07-06 LAB — COMPREHENSIVE METABOLIC PANEL
ALT: 12 U/L — ABNORMAL LOW (ref 14–54)
AST: 21 U/L (ref 15–41)
Albumin: 3.7 g/dL (ref 3.5–5.0)
Alkaline Phosphatase: 65 U/L (ref 38–126)
Anion gap: 9 (ref 5–15)
BUN: 19 mg/dL (ref 6–20)
CO2: 26 mmol/L (ref 22–32)
Calcium: 8.9 mg/dL (ref 8.9–10.3)
Chloride: 101 mmol/L (ref 101–111)
Creatinine, Ser: 0.88 mg/dL (ref 0.44–1.00)
GFR calc Af Amer: >60 mL/min (ref >60)
GFR calc non Af Amer: >60 mL/min (ref >60)
Glucose, Bld: 108 mg/dL — ABNORMAL HIGH (ref 65–99)
Potassium: 3 mmol/L — ABNORMAL LOW (ref 3.5–5.1)
Sodium: 136 mmol/L (ref 135–145)
Total Bilirubin: 0.3 mg/dL (ref 0.3–1.2)
Total Protein: 6.9 g/dL (ref 6.5–8.1)

## 2017-07-06 LAB — CBC WITH DIFFERENTIAL/PLATELET
Basophils Absolute: 0 10*3/uL (ref 0.0–0.1)
Basophils Relative: 0 %
Eosinophils Absolute: 0 10*3/uL (ref 0.0–0.7)
Eosinophils Relative: 0 %
HCT: 35.3 % — ABNORMAL LOW (ref 36.0–46.0)
Hemoglobin: 12.3 g/dL (ref 12.0–15.0)
Lymphocytes Relative: 40 %
Lymphs Abs: 2.8 10*3/uL (ref 0.7–4.0)
MCH: 30.4 pg (ref 26.0–34.0)
MCHC: 34.8 g/dL (ref 30.0–36.0)
MCV: 87.4 fL (ref 78.0–100.0)
Monocytes Absolute: 0.5 10*3/uL (ref 0.1–1.0)
Monocytes Relative: 7 %
Neutro Abs: 3.6 10*3/uL (ref 1.7–7.7)
Neutrophils Relative %: 53 %
Platelets: 269 10*3/uL (ref 150–400)
RBC: 4.04 MIL/uL (ref 3.87–5.11)
RDW: 13.3 % (ref 11.5–15.5)
WBC: 6.8 10*3/uL (ref 4.0–10.5)

## 2017-07-06 LAB — CBG MONITORING, ED: Glucose-Capillary: 96 mg/dL (ref 65–99)

## 2017-07-06 IMAGING — CT CT HEAD W/O CM
3 series · 14 of 47 positions shown, 16 images · non-contrast
Comparison: [DATE]

CLINICAL DATA: Dizziness starting at [F9] hours today.

EXAM:
CT HEAD WITHOUT CONTRAST
TECHNIQUE: Contiguous axial images were obtained from the base of the skull
through the vertex without intravenous contrast.

[Series 2: head wo · axial · 0.47mm/px · z∈[-221,-81]mm · 8 of 34 slices shown, 10 images]
[im 3/34  brain]
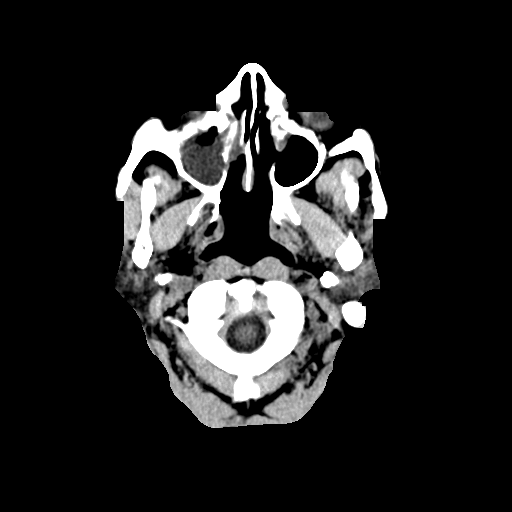
[im 3/34  bone]
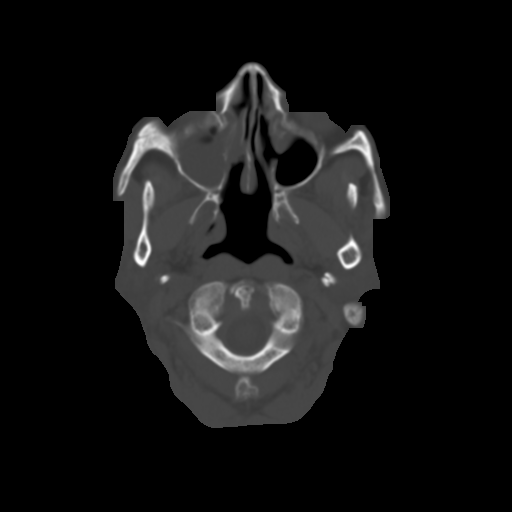
[im 7/34  brain]
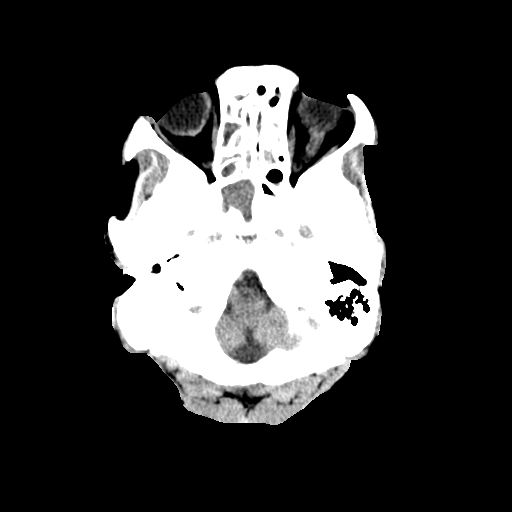
[im 11/34  brain]
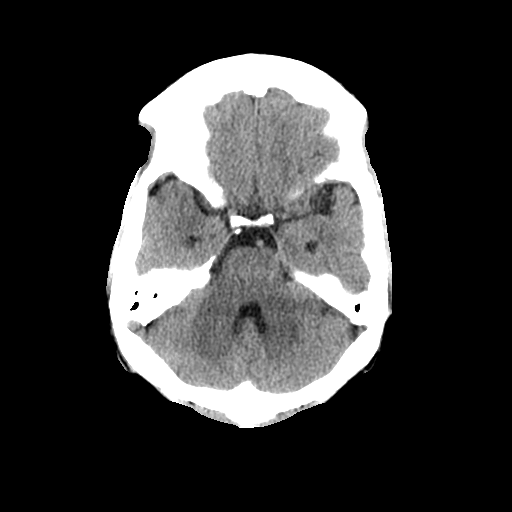
[im 15/34  brain]
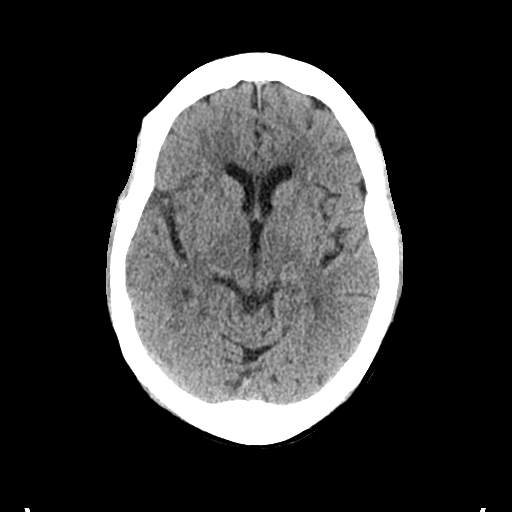
[im 19/34  brain]
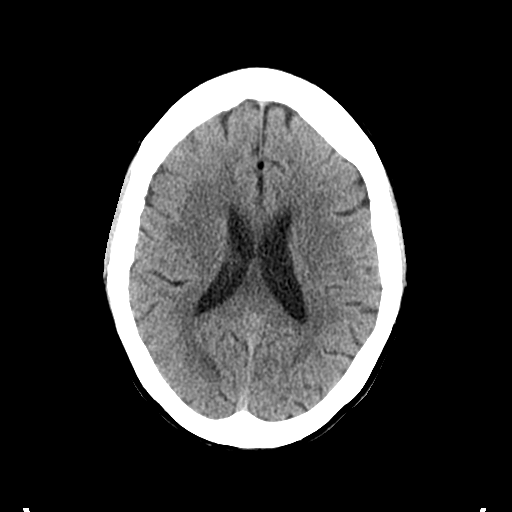
[im 19/34  bone]
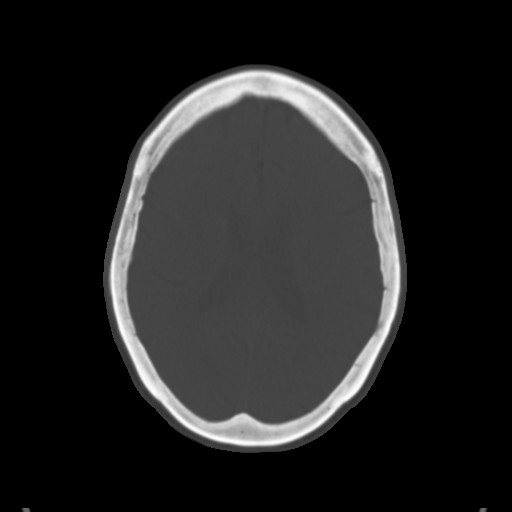
[im 23/34  brain]
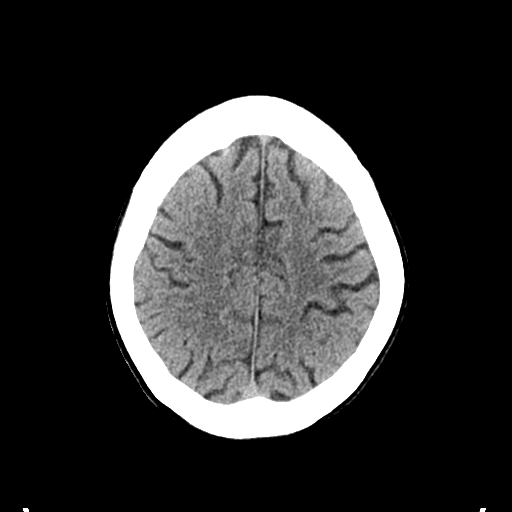
[im 27/34  brain]
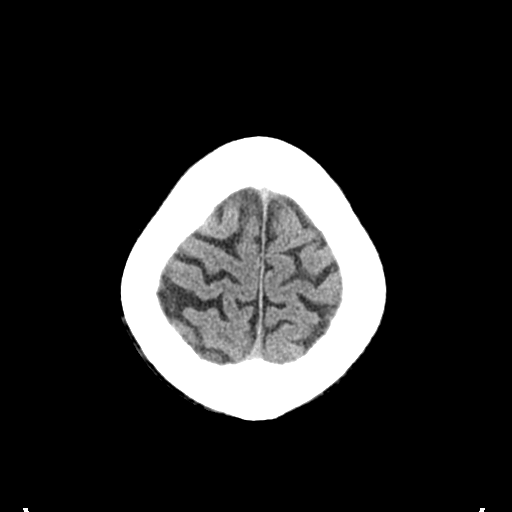
[im 31/34  brain]
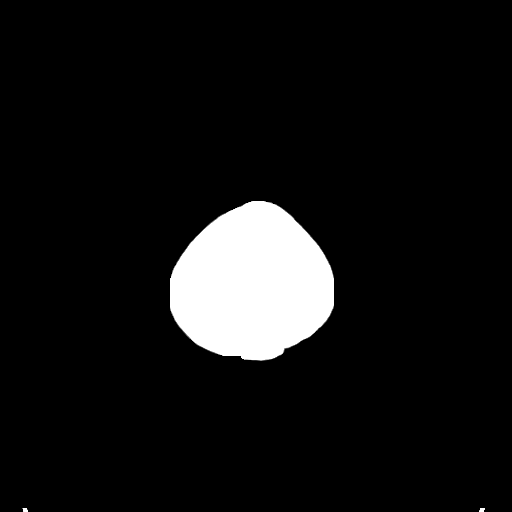

[Series 4: coronal soft tissue · coronal · 0.33mm/px · 3 of 82 slices shown]
[im 28/82  brain]
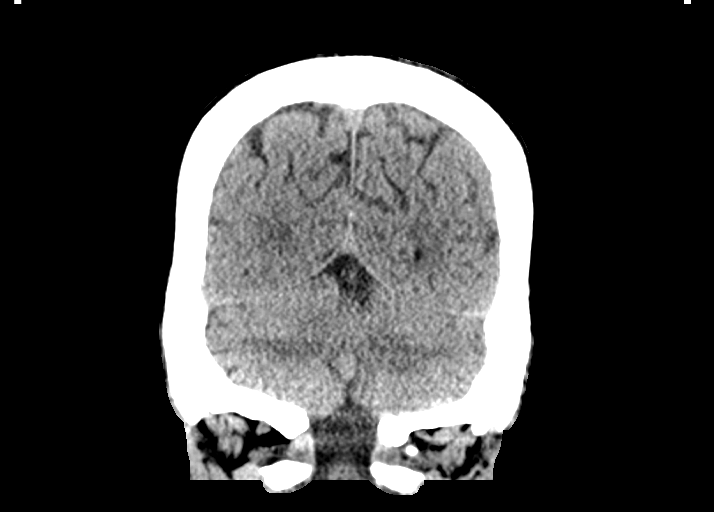
[im 37/82  brain]
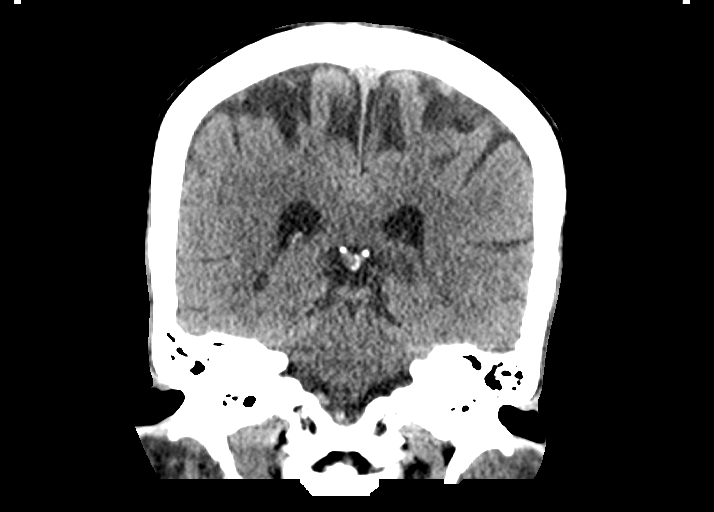
[im 46/82  brain]
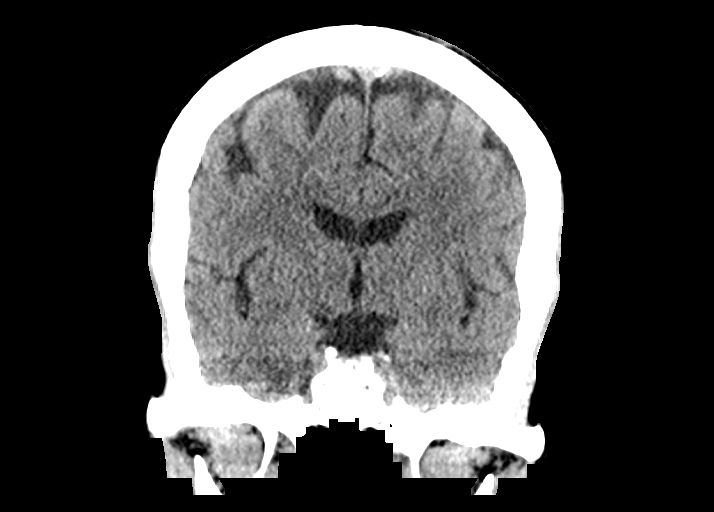

[Series 5: sagittal soft tissue · sagittal · 0.33mm/px · 3 of 84 slices shown]
[im 28/84  brain]
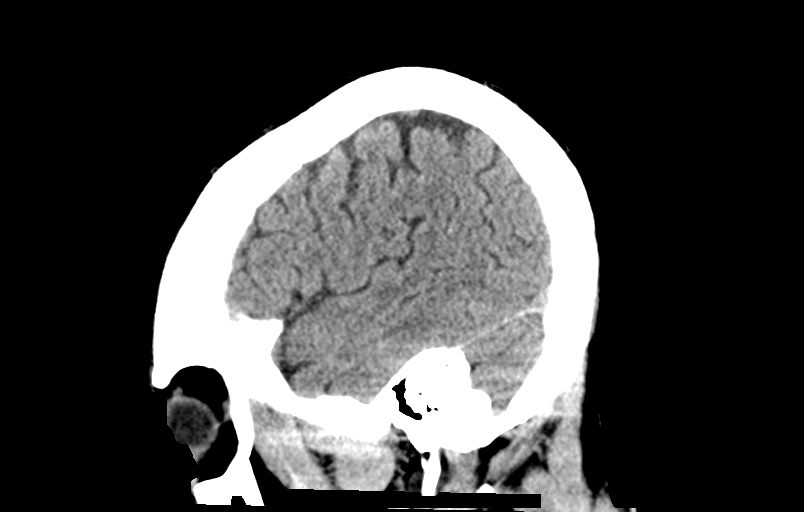
[im 42/84  brain]
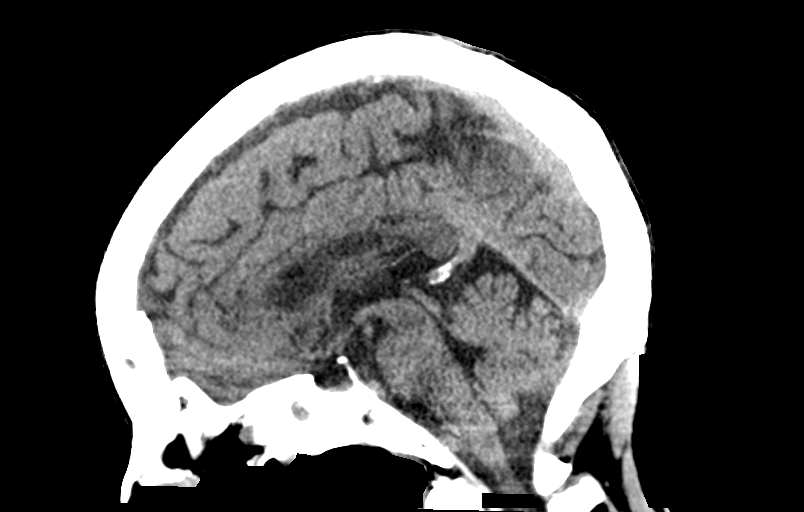
[im 56/84  brain]
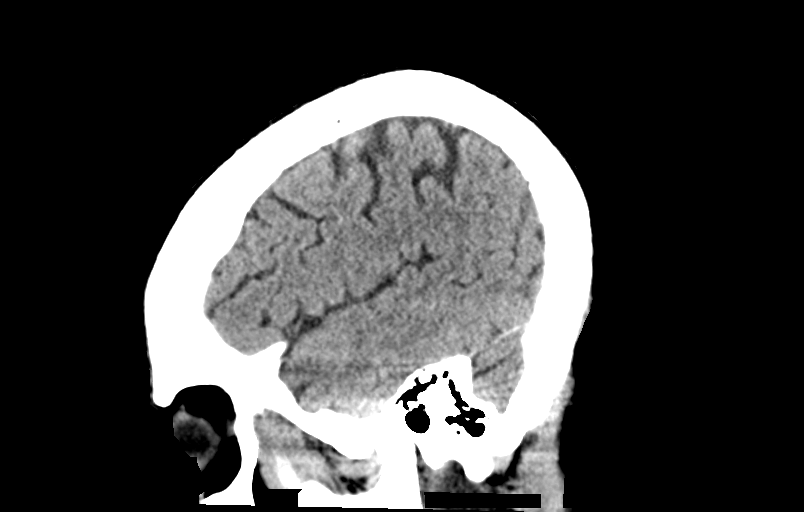

[14 of 47 positions shown; findings below may reference images not displayed]

FINDINGS: Brain: Minimal small vessel ischemic disease of periventricular
white matter, likely remote. No large vascular territory infarct. No
intracranial hemorrhage, midline shift or edema. Midline fourth
ventricle and basal cisterns without effacement. No hydrocephalus.
No intra-axial mass nor extra-axial fluid collections.

Vascular: No hyperdense vessels or unexpected calcifications.

Skull: No acute skull fracture or suspicious osseous lesions.

Sinuses/Orbits: Marked near complete opacification of the included
frontal, ethmoid and sphenoid sinuses with near complete
opacification of the right maxillary sinus. Mild mucosal thickening
of the included left maxillary sinus. Small osteoma in the left
frontal sinus. Bilateral lens replacements. Intact orbits and
globes.

Other: None
IMPRESSION: 1. Marked chronic pansinusitis.
2. Minimal chronic small vessel ischemic disease. No acute
intracranial abnormality.

## 2017-07-06 IMAGING — DX DG CHEST 1V PORT
1 series · 1 of 1 positions shown · non-contrast
Comparison: [DATE]

CLINICAL DATA: Loss of consciousness

EXAM:
PORTABLE CHEST 1 VIEW

[chest ap]
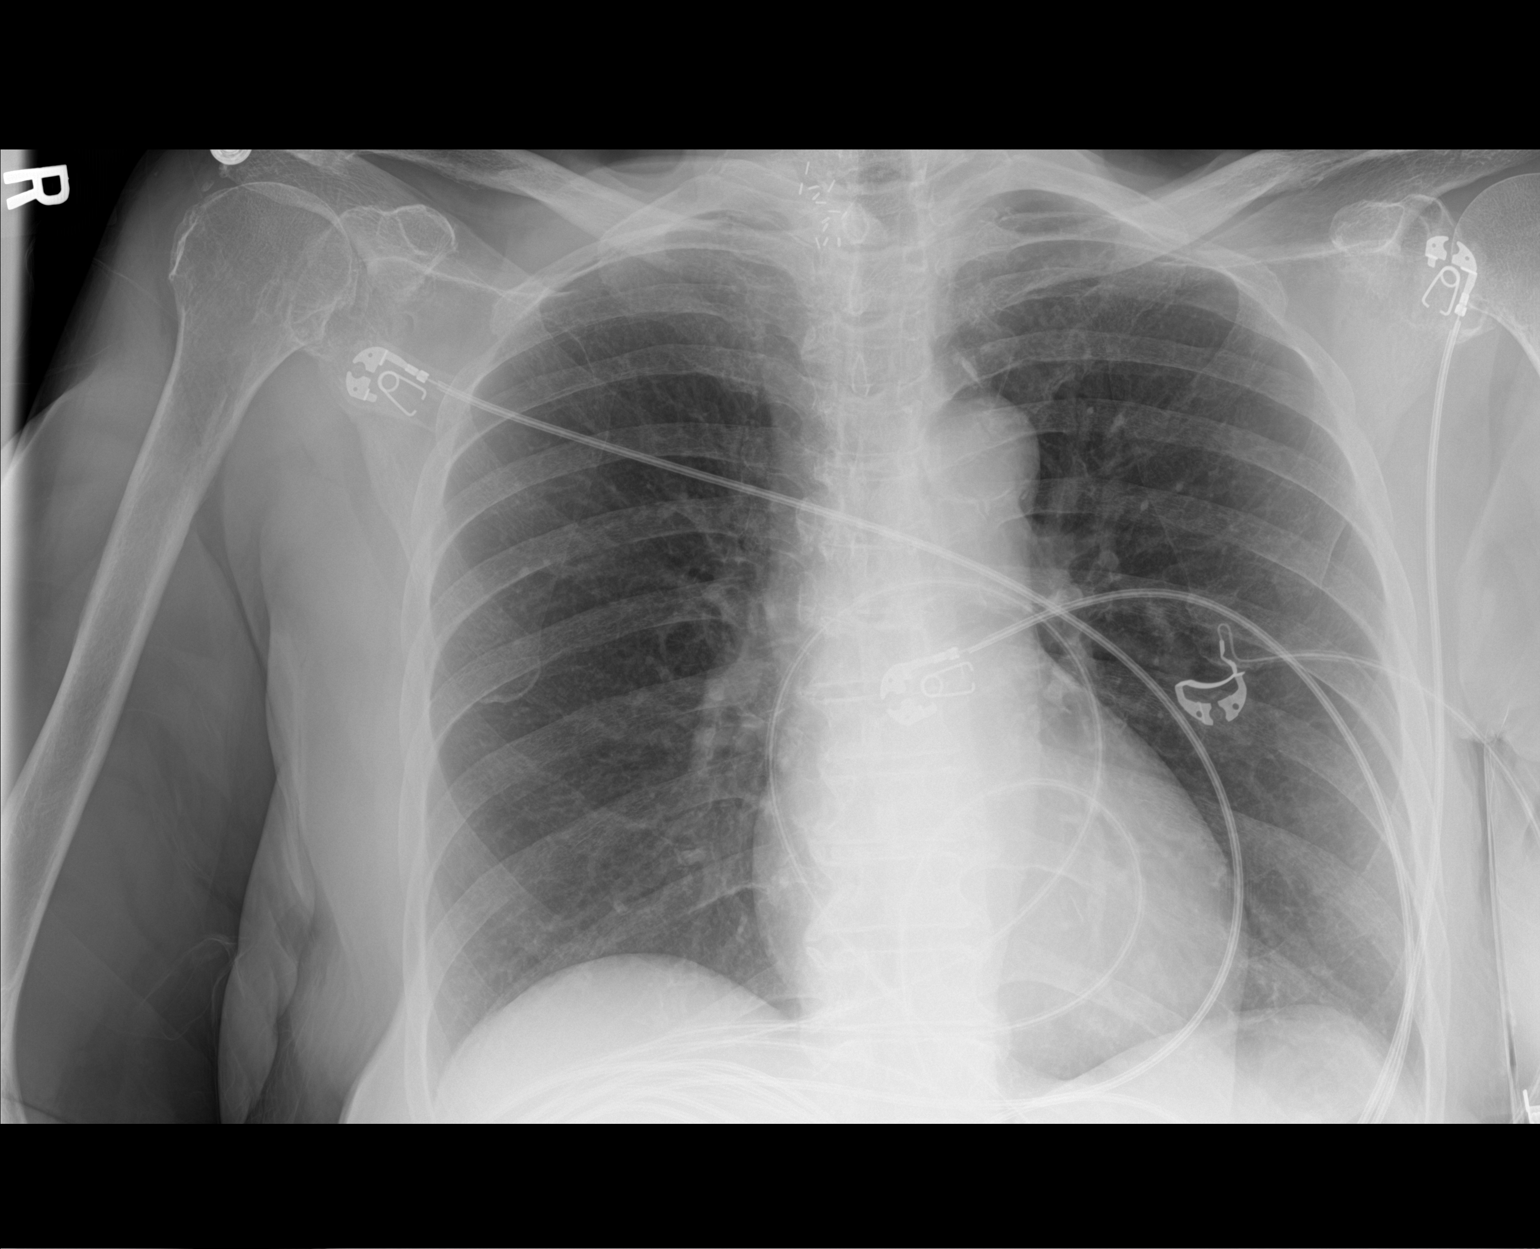

[1 of 1 positions shown; findings below may reference images not displayed]

FINDINGS: The heart size and mediastinal contours are within normal limits.
Aortic atherosclerosis without aneurysm. Both lungs are clear.
Surgical clips in the region of the right thyroid gland. Facet
arthropathy of the included lower cervical spine with hypertrophy
and joint space narrowing. Calcific rotator cuff tendinopathy about
the included right shoulder with AC joint osteoarthritis
bilaterally. The visualized skeletal structures are nonacute.
IMPRESSION: No active pulmonary disease.  Aortic atherosclerosis.

## 2017-07-06 MED ORDER — SODIUM CHLORIDE 0.9 % IV SOLN: INTRAVENOUS | Status: DC

## 2017-07-06 MED ORDER — POTASSIUM CHLORIDE CRYS ER 20 MEQ PO TBCR
40.0000 meq | EXTENDED_RELEASE_TABLET | Freq: Once | ORAL | Status: AC
  Administered 2017-07-06: 40 meq via ORAL
  Filled 2017-07-06: qty 2

## 2017-07-06 MED ORDER — SODIUM CHLORIDE 0.9 % IV BOLUS (SEPSIS)
1000.0000 mL | Freq: Once | INTRAVENOUS | Status: AC
  Administered 2017-07-06: 1000 mL via INTRAVENOUS

## 2017-07-06 NOTE — Discharge Instructions (Signed)
As discussed, your evaluation today has been largely reassuring.  But, it is important that you monitor your condition carefully, and do not hesitate to return to the ED if you develop new, or concerning changes in your condition. ? ?Otherwise, please follow-up with your physician for appropriate ongoing care. ? ?

## 2017-07-06 NOTE — ED Notes (Signed)
Pt reports feeling dizzy onset 18:00. Denies n/v. No new medications.

## 2017-07-06 NOTE — ED Notes (Signed)
Pt. IV infiltrated, IV removed per Vanita Panda, MD. RN,Sara made aware.

## 2017-07-06 NOTE — ED Provider Notes (Signed)
[facility] DEPT Provider Note   CSN: [id number] Arrival date & time: 07/06/17  1941     History   Chief Complaint Chief Complaint  Patient presents with  . Dizziness    HPI AYOMIDE ZULETA is a 77 y.o. female.  HPI  Patient presents with dizziness.  Onset was about 5 hours prior to my evaluation, and since onset symptoms been persistent. However, it seems as though she has multiple other issues as well.  Exact time course is unclear, but she notes that over the recent past she has had weakness in both of her legs, nausea, anorexia, some abdominal discomfort, fatigue. It is unclear if she has had prior similar episodes of dizziness. She notes that since onset symptoms been worse with ambulation, but not necessarily with head motion. Symptoms are better with supine positioning, sitting. No new vision loss, vomiting, confusion, disorientation, weakness in any particular extremity.   Past Medical History:  Diagnosis Date  . Asthma   . Bowel obstruction ([place])   . Environmental allergies   . GERD (gastroesophageal reflux disease)   . Glaucoma 05/09/2017  . H. pylori infection   . Hypertension   . Osteopenia 05/26/2017    Patient Active Problem List   Diagnosis Date Noted  . Severe persistent asthma 06/04/2017  . 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 Nasal polyposis 06/04/2017  . Leukocytosis 06/04/2017  . Osteopenia 05/26/2017  . Wellness examination 05/09/2017  . Glaucoma 05/09/2017  . GERD (gastroesophageal reflux disease)   . H. pylori infection   . Moderate persistent asthma 03/20/2016  . Other allergic rhinitis 03/20/2016  . Cough, persistent 03/20/2016  . Hypoglycemia 04/17/2015  . Malnutrition of moderate degree 04/11/2015  . SBO (small bowel obstruction) ([place]) 04/10/2015  . HTN (hypertension) 04/10/2015    Past Surgical History:  Procedure Laterality Date  . ABDOMINAL HYSTERECTOMY    . BREAST BIOPSY    . COLON SURGERY    .  KNEE SURGERY    . LAPAROTOMY N/A 04/18/2015   Procedure: Exploratory laparotomy with lysis of adhesions, possible bowel resection;  Surgeon: Ralene Ok, MD;  Location: [facility];  Service: General;  Laterality: N/A;  . NASAL SINUS SURGERY    . THYROID SURGERY      OB History    No data available       Home Medications    Prior to Admission medications   Medication Sig Start Date End Date Taking? Authorizing Provider  acetaminophen (TYLENOL) 500 MG tablet Take 500 mg by mouth daily as needed for mild pain, moderate pain, fever or headache. For pain   Yes [provider]  albuterol (PROVENTIL) (2.5 MG/3ML) 0.083% nebulizer solution Take 3 mLs (2.5 mg total) by nebulization 2 (two) times daily as needed for wheezing or shortness of breath. 07/05/16  Yes Bobbitt, Sedalia Muta, MD  albuterol (VENTOLIN HFA) 108 (90 Base) MCG/ACT inhaler Inhale 2 puffs into the lungs every 4 (four) hours as needed for wheezing or shortness of breath. 08/28/16  Yes Bobbitt, Sedalia Muta, MD  budesonide-formoterol [facility]) 160-4.5 MCG/ACT inhaler Inhale 2 puffs into the lungs 2 (two) times daily.   Yes [provider]  cholecalciferol (VITAMIN D) 1000 units tablet Take 2,000 Units by mouth daily.   Yes [provider]  ipratropium (ATROVENT) 0.06 % nasal spray USE ONE SPRAY(S) IN EACH NOSTRIL THREE TIMES DAILY Patient taking differently: USE ONE SPRAY(S) IN EACH NOSTRIL TWICE  DAILY 01/10/17  Yes Bobbitt, Sedalia Muta, MD  losartan-hydrochlorothiazide (  HYZAAR) 100-12.5 MG tablet Take 1 tablet by mouth daily. 06/04/17  Yes [name], MD  montelukast (SINGULAIR) 10 MG tablet Take 1 tablet (10 mg total) by mouth at bedtime. 04/10/17  Yes Kozlow, Donnamarie Poag, MD  omeprazole (PRILOSEC) 40 MG capsule Take 40 mg by mouth daily. For acid reflux   Yes [provider]  polyethylene glycol powder (GLYCOLAX/MIRALAX) powder Take 17 g by mouth daily.   Yes [provider]  Tiotropium  Bromide Monohydrate (SPIRIVA RESPIMAT) 1.25 MCG/ACT AERS Inhale 2 puffs into the lungs daily. 06/04/17  Yes Bobbitt, Sedalia Muta, MD  vitamin B-12 (CYANOCOBALAMIN) 1000 MCG tablet Take 1,000 mcg by mouth daily.   Yes [provider]  azelastine (ASTELIN) 0.1 % nasal spray Place 2 sprays into both nostrils 2 (two) times daily. Patient not taking: Reported on 07/06/2017 12/31/16   Bobbitt, Sedalia Muta, MD  budesonide (PULMICORT) 0.5 MG/2ML nebulizer solution 2 mL mixed in nasal irrigation daily as needed. 06/04/17   Bobbitt, Sedalia Muta, MD  EPINEPHrine (EPIPEN 2-PAK) 0.3 mg/0.3 mL IJ SOAJ injection Use as directed for severe allergic reaction 06/17/17   Bobbitt, Sedalia Muta, MD    Family History Family History  Problem Relation Age of Onset  . Allergic rhinitis Neg Hx   . Angioedema Neg Hx   . Asthma Neg Hx   . Eczema Neg Hx   . Immunodeficiency Neg Hx   . Urticaria Neg Hx     Social History Social History   Tobacco Use  . Smoking status: Former Smoker    Types: Cigarettes  . Smokeless tobacco: Never Used  Substance Use Topics  . Alcohol use: No  . Drug use: No     Allergies   Asa buff (mag [buffered aspirin]; Fish allergy; Other; Peanut-containing drug products; Penicillins; Shellfish-derived products; and Strawberry extract   Review of Systems Review of Systems  Constitutional:       Per HPI, otherwise negative  HENT:       Per HPI, otherwise negative  Respiratory:       Per HPI, otherwise negative  Cardiovascular:       Per HPI, otherwise negative  Gastrointestinal: Negative for vomiting.  Endocrine:       Negative aside from HPI  Genitourinary:       Neg aside from HPI   Musculoskeletal:       Per HPI, otherwise negative  Skin: Negative.   Neurological: Positive for dizziness and light-headedness. Negative for syncope.     Physical Exam Updated Vital Signs BP 130/72 (BP Location: Left Arm)   Pulse 74   Temp (!) 97.4 F (36.3 C) (Oral)   Resp  (!) 27   Ht 5\' 5"  (1.651 m)   Wt 63.5 kg (140 lb)   SpO2 100%   BMI 23.30 kg/m   Physical Exam  Constitutional: She is oriented to person, place, and time. She appears well-developed and well-nourished. No distress.  HENT:  Head: Normocephalic and atraumatic.  Eyes: Conjunctivae and EOM are normal.  Cardiovascular: Normal rate and regular rhythm.  Pulmonary/Chest: Effort normal and breath sounds normal. No stridor. No respiratory distress.  Abdominal: She exhibits no distension.  Musculoskeletal: She exhibits no edema.  Neurological: She is alert and oriented to person, place, and time. She displays atrophy. She displays no tremor. No cranial nerve deficit. She exhibits normal muscle tone. She displays no seizure activity. Coordination and gait normal.  Patient ambulates without difficulty  Skin: Skin is warm and dry.  Psychiatric: She has a normal mood and affect.  Nursing note and vitals reviewed.    ED Treatments / Results  Labs (all labs ordered are listed, but only abnormal results are displayed) Labs Reviewed  COMPREHENSIVE METABOLIC PANEL - Abnormal; Notable for the following components:      Result Value   Potassium 3.0 (*)    Glucose, Bld 108 (*)    ALT 12 (*)    All other components within normal limits  CBC WITH DIFFERENTIAL/PLATELET - Abnormal; Notable for the following components:   HCT 35.3 (*)    All other components within normal limits  URINALYSIS, COMPLETE (UACMP) WITH MICROSCOPIC - Abnormal; Notable for the following components:   Color, Urine STRAW (*)    Specific Gravity, Urine 1.004 (*)    Squamous Epithelial / LPF 0-5 (*)    All other components within normal limits  CBG MONITORING, ED    EKG  EKG Interpretation  Date/Time:  Saturday July 06 2017 21:03:28 EST Ventricular Rate:  70 PR Interval:    QRS Duration: 99 QT Interval:  444 QTC Calculation: 480 R Axis:   24 Text Interpretation:  Sinus rhythm Probable left atrial enlargement  Abnormal R-wave progression, early transition Borderline T wave abnormalities Abnormal ekg Confirmed by Carmin Muskrat [phone]) on 07/06/2017 11:13:07 PM       Radiology Ct Head Wo Contrast  Result Date: 07/06/2017 CLINICAL DATA:  Dizziness starting at 1800 hours today. EXAM: CT HEAD WITHOUT CONTRAST TECHNIQUE: Contiguous axial images were obtained from the base of the skull through the vertex without intravenous contrast. COMPARISON:  04/19/2018 FINDINGS: Brain: Minimal small vessel ischemic disease of periventricular white matter, likely remote. No large vascular territory infarct. No intracranial hemorrhage, midline shift or edema. Midline fourth ventricle and basal cisterns without effacement. No hydrocephalus. No intra-axial mass nor extra-axial fluid collections. Vascular: No hyperdense vessels or unexpected calcifications. Skull: No acute skull fracture or suspicious osseous lesions. Sinuses/Orbits: Marked near complete opacification of the included frontal, ethmoid and sphenoid sinuses with near complete opacification of the right maxillary sinus. Mild mucosal thickening of the included left maxillary sinus. Small osteoma in the left frontal sinus. Bilateral lens replacements. Intact orbits and globes. Other: None IMPRESSION: 1. Marked chronic pansinusitis. 2. Minimal chronic small vessel ischemic disease. No acute intracranial abnormality. Electronically Signed   By: Ashley Royalty M.D.   On: 07/06/2017 22:12   Dg Chest Port 1 View  Result Date: 07/06/2017 CLINICAL DATA:  Loss of consciousness EXAM: PORTABLE CHEST 1 VIEW COMPARISON:  06/08/2016 FINDINGS: The heart size and mediastinal contours are within normal limits. Aortic atherosclerosis without aneurysm. Both lungs are clear. Surgical clips in the region of the right thyroid gland. Facet arthropathy of the included lower cervical spine with hypertrophy and joint space narrowing. Calcific rotator cuff tendinopathy about the included right  shoulder with AC joint osteoarthritis bilaterally. The visualized skeletal structures are nonacute. IMPRESSION: No active pulmonary disease.  Aortic atherosclerosis. Electronically Signed   By: Ashley Royalty M.D.   On: 07/06/2017 22:06    Procedures Procedures (including critical care time)  Medications Ordered in ED Medications  sodium chloride 0.9 % bolus 1,000 mL (1,000 mLs Intravenous New Bag/Given 07/06/17 2217)    And  0.9 %  sodium chloride infusion (not administered)  potassium chloride SA (K-DUR,KLOR-CON) CR tablet 40 mEq (not administered)     Initial Impression / Assessment and Plan / ED Course  I have reviewed the triage vital signs and the nursing notes.  Pertinent labs & imaging results that were available during my care of the patient were reviewed by me and considered in my medical decision making (see chart for details).  11:18 PM Patient ambulating to the bathroom without any difficulty. I reviewed the patient's chart including recent interactions with her outpatient staff, and changes in medication for her respiratory condition or vitamin K. I reviewed these changes with the patient, and we reviewed all findings at length, including reassuring labs, CT, x-ray. No CT evidence for stroke, mass, hemorrhage, no x-ray evidence for infection. Given the patient's reassuring physical exam, ability to ambulate without any apparent difficulty, there is low suspicion for occult stroke, no evidence for other acute new pathology, including no evidence for ACS, PE, pneumonia Patient has no abdominal pain, though she has a history of bowel obstruction, this seems unlikely today. Given the patient's reassuring vital signs, after mentioned reassuring workup here, she was discharged in stable condition after repletion of her hypokalemia to follow-up with her primary care physician.  Final Clinical Impressions(s) / ED Diagnoses  Dizziness Hypokalemia   Carmin Muskrat, MD 07/06/17  2319

## 2017-07-06 NOTE — ED Triage Notes (Addendum)
Pt reports dizziness that started at 1800 tonight and occurs with sitting or standing. No LOC or injury

## 2017-07-06 NOTE — ED Notes (Signed)
Patient ambulatory to the room from triage with steady gait and 1 person assist

## 2017-07-08 NOTE — Telephone Encounter (Signed)
Completed&signed, copy sent to scan. Daughter informed and will pick up tomorrow, 2/19.

## 2017-07-09 ENCOUNTER — Encounter: Payer: Self-pay | Admitting: Internal Medicine

## 2017-07-09 ENCOUNTER — Ambulatory Visit (INDEPENDENT_AMBULATORY_CARE_PROVIDER_SITE_OTHER): Payer: Medicare HMO | Admitting: Internal Medicine

## 2017-07-09 VITALS — BP 132/86 | HR 73 | Temp 98.0°F | Ht 65.0 in | Wt 149.0 lb

## 2017-07-09 DIAGNOSIS — E876 Hypokalemia: Secondary | ICD-10-CM | POA: Insufficient documentation

## 2017-07-09 DIAGNOSIS — I10 Essential (primary) hypertension: Secondary | ICD-10-CM | POA: Diagnosis not present

## 2017-07-09 DIAGNOSIS — J4540 Moderate persistent asthma, uncomplicated: Secondary | ICD-10-CM | POA: Diagnosis not present

## 2017-07-09 MED ORDER — POTASSIUM CHLORIDE ER 10 MEQ PO TBCR: 10.0000 meq | EXTENDED_RELEASE_TABLET | Freq: Every day | ORAL | 3 refills | Status: DC

## 2017-07-09 NOTE — Assessment & Plan Note (Signed)
To add kdur 10 qd in light or recurrent issue of low K on diuretic,  to f/u any worsening symptoms or concerns

## 2017-07-09 NOTE — Assessment & Plan Note (Signed)
stable overall by history and exam, and pt to continue medical treatment as before,  to f/u any worsening symptoms or concerns 

## 2017-07-09 NOTE — Progress Notes (Signed)
Subjective:    Patient ID: Angelica [name], female    DOB: [birth year], 77 y.o.   MRN: [id number]  HPI  Here to f/u after seen at ED with dizziness 2/16 and low K; K replaced in ED, dizziness resolved; Pt denies chest pain, increased sob or doe, wheezing, orthopnea, PND, increased LE swelling, palpitations, or syncope.Pt denies new neurological symptoms such as new headache, or facial or extremity weakness or numbness  Pt denies polydipsia, polyuria, No other new complaints or interval hx.   Pt denies fever, wt loss, night sweats, loss of appetite, or other constitutional symptoms  Denies urinary symptoms such as dysuria, frequency, urgency, flank pain, hematuria or n/v, fever, chills. Past Medical History:  Diagnosis Date  . Asthma   . Bowel obstruction ([place])   . Environmental allergies   . GERD (gastroesophageal reflux disease)   . Glaucoma 05/09/2017  . H. pylori infection   . Hypertension   . Osteopenia 05/26/2017   Past Surgical History:  Procedure Laterality Date  . ABDOMINAL HYSTERECTOMY    . BREAST BIOPSY    . COLON SURGERY    . KNEE SURGERY    . LAPAROTOMY N/A 04/18/2015   Procedure: Exploratory laparotomy with lysis of adhesions, possible bowel resection;  Surgeon: Ralene Ok, MD;  Location: [facility];  Service: General;  Laterality: N/A;  . NASAL SINUS SURGERY    . THYROID SURGERY      reports that she has quit smoking. Her smoking use included cigarettes. she has never used smokeless tobacco. She reports that she does not drink alcohol or use drugs. family history is not on file. Allergies  Allergen Reactions  . Asa Buff (Mag [Buffered Aspirin] Other (See Comments)    Excessive sweating  . Fish Allergy Other (See Comments)    Unknown reaction  . Other Other (See Comments)    Gelcaps; gel-containing capsules; extended release tablets  . Peanut-Containing Drug Products Other (See Comments)    From allergy test  . Penicillins Itching    Has patient had a PCN reaction  causing immediate rash, facial/tongue/throat swelling, SOB or lightheadedness with hypotension: No Has patient had a PCN reaction causing severe rash involving mucus membranes or skin necrosis: No Has patient had a PCN reaction that required hospitalization No Has patient had a PCN reaction occurring within the last 10 years: No If all of the above answers are "NO", then may proceed with Cephalosporin use.  Marland Kitchen Shellfish-Derived Products Other (See Comments)    From allergy test  . Strawberry Extract Other (See Comments)    Worsened allergy symptoms   Current Outpatient Medications on File Prior to Visit  Medication Sig Dispense Refill  . acetaminophen (TYLENOL) 500 MG tablet Take 500 mg by mouth daily as needed for mild pain, moderate pain, fever or headache. For pain    . albuterol (PROVENTIL) (2.5 MG/3ML) 0.083% nebulizer solution Take 3 mLs (2.5 mg total) by nebulization 2 (two) times daily as needed for wheezing or shortness of breath. 75 mL 0  . albuterol (VENTOLIN HFA) 108 (90 Base) MCG/ACT inhaler Inhale 2 puffs into the lungs every 4 (four) hours as needed for wheezing or shortness of breath. 1 Inhaler 1  . azelastine (ASTELIN) 0.1 % nasal spray Place 2 sprays into both nostrils 2 (two) times daily. 30 mL 5  . budesonide (PULMICORT) 0.5 MG/2ML nebulizer solution 2 mL mixed in nasal irrigation daily as needed. 60 mL 5  . budesonide-formoterol (SYMBICORT) 160-4.5 MCG/ACT inhaler Inhale 2  puffs into the lungs 2 (two) times daily.    . cholecalciferol (VITAMIN D) 1000 units tablet Take 2,000 Units by mouth daily.    Marland Kitchen EPINEPHrine (EPIPEN 2-PAK) 0.3 mg/0.3 mL IJ SOAJ injection Use as directed for severe allergic reaction 2 Device 1  . ipratropium (ATROVENT) 0.06 % nasal spray USE ONE SPRAY(S) IN EACH NOSTRIL THREE TIMES DAILY (Patient taking differently: USE ONE SPRAY(S) IN EACH NOSTRIL TWICE  DAILY) 15 mL 5  . losartan-hydrochlorothiazide (HYZAAR) 100-12.5 MG tablet Take 1 tablet by mouth  daily. 90 tablet 3  . montelukast (SINGULAIR) 10 MG tablet Take 1 tablet (10 mg total) by mouth at bedtime. 30 tablet 5  . omeprazole (PRILOSEC) 40 MG capsule Take 40 mg by mouth daily. For acid reflux    . polyethylene glycol powder (GLYCOLAX/MIRALAX) powder Take 17 g by mouth daily.    . Tiotropium Bromide Monohydrate (SPIRIVA RESPIMAT) 1.25 MCG/ACT AERS Inhale 2 puffs into the lungs daily. 4 g 5  . vitamin B-12 (CYANOCOBALAMIN) 1000 MCG tablet Take 1,000 mcg by mouth daily.     Current Facility-Administered Medications on File Prior to Visit  Medication Dose Route Frequency Provider Last Rate Last Dose  . Benralizumab SOSY 30 mg  30 mg Subcutaneous Q28 days Bobbitt, Sedalia Muta, MD   30 mg at 06/17/17 1224   Review of Systems  Constitutional: Negative for other unusual diaphoresis or sweats HENT: Negative for ear discharge or swelling Eyes: Negative for other worsening visual disturbances Respiratory: Negative for stridor or other swelling  Gastrointestinal: Negative for worsening distension or other blood Genitourinary: Negative for retention or other urinary change Musculoskeletal: Negative for other MSK pain or swelling Skin: Negative for color change or other new lesions Neurological: Negative for worsening tremors and other numbness  Psychiatric/Behavioral: Negative for worsening agitation or other fatigue All other system neg per pt    Objective:   Physical Exam BP 132/86   Pulse 73   Temp 98 F (36.7 C) (Oral)   Ht 5\' 5"  (1.651 m)   Wt 149 lb (67.6 kg)   SpO2 99%   BMI 24.79 kg/m  VS noted,  Constitutional: Pt appears in NAD HENT: Head: NCAT.  Right Ear: External ear normal.  Left Ear: External ear normal.  Eyes: . Pupils are equal, round, and reactive to light. Conjunctivae and EOM are normal Nose: without d/c or deformity Neck: Neck supple. Gross normal ROM Cardiovascular: Normal rate and regular rhythm.   Pulmonary/Chest: Effort normal and breath sounds  without rales or wheezing.  Abd:  Soft, NT, ND, + BS, no organomegaly Neurological: Pt is alert. At baseline orientation, motor grossly intact Skin: Skin is warm. No rashes, other new lesions, no LE edema Psychiatric: Pt behavior is normal without agitation  No other exam findings    Assessment & Plan:

## 2017-07-09 NOTE — Assessment & Plan Note (Signed)
stable overall by history and exam, recent data reviewed with pt, and pt to continue medical treatment as before,  to f/u any worsening symptoms or concerns BP Readings from Last 3 Encounters:  07/09/17 132/86  07/06/17 118/65  06/04/17 114/74

## 2017-07-09 NOTE — Patient Instructions (Addendum)
Please take all new medication as prescribed - the potassium pill every day (low dose)  Please return to the LAB only in 1 week to recheck the potassium  Please continue all other medications as before, and refills have been done if requested.  Please have the pharmacy call with any other refills you may need.  Please continue your efforts at being more active, low cholesterol diet, and weight control.  You are otherwise up to date with prevention measures today.  Please keep your appointments with your specialists as you may have planned  Please return on June 21 as planned, or sooner if needed

## 2017-07-10 ENCOUNTER — Other Ambulatory Visit (INDEPENDENT_AMBULATORY_CARE_PROVIDER_SITE_OTHER): Payer: Medicare HMO

## 2017-07-10 ENCOUNTER — Encounter: Payer: Self-pay | Admitting: Internal Medicine

## 2017-07-10 DIAGNOSIS — E876 Hypokalemia: Secondary | ICD-10-CM | POA: Diagnosis not present

## 2017-07-10 LAB — BASIC METABOLIC PANEL
BUN: 12 mg/dL (ref 6–23)
CO2: 27 mEq/L (ref 19–32)
Calcium: 9.2 mg/dL (ref 8.4–10.5)
Chloride: 98 mEq/L (ref 96–112)
Creatinine, Ser: 0.88 mg/dL (ref 0.40–1.20)
GFR: 80.02 mL/min (ref >60.00)
Glucose, Bld: 112 mg/dL — ABNORMAL HIGH (ref 70–99)
Potassium: 3.4 mEq/L — ABNORMAL LOW (ref 3.5–5.1)
Sodium: 134 mEq/L — ABNORMAL LOW (ref 135–145)

## 2017-07-15 ENCOUNTER — Ambulatory Visit (INDEPENDENT_AMBULATORY_CARE_PROVIDER_SITE_OTHER): Payer: Medicare HMO

## 2017-07-15 DIAGNOSIS — J4550 Severe persistent asthma, uncomplicated: Secondary | ICD-10-CM | POA: Diagnosis not present

## 2017-07-17 ENCOUNTER — Telehealth: Payer: Self-pay

## 2017-07-17 ENCOUNTER — Other Ambulatory Visit (INDEPENDENT_AMBULATORY_CARE_PROVIDER_SITE_OTHER): Payer: Medicare HMO

## 2017-07-17 ENCOUNTER — Encounter: Payer: Self-pay | Admitting: Internal Medicine

## 2017-07-17 DIAGNOSIS — E876 Hypokalemia: Secondary | ICD-10-CM

## 2017-07-17 LAB — BASIC METABOLIC PANEL
BUN: 19 mg/dL (ref 6–23)
CO2: 21 mEq/L (ref 19–32)
Calcium: 9.7 mg/dL (ref 8.4–10.5)
Chloride: 103 mEq/L (ref 96–112)
Creatinine, Ser: 0.88 mg/dL (ref 0.40–1.20)
GFR: 80.01 mL/min (ref >60.00)
Glucose, Bld: 84 mg/dL (ref 70–99)
Potassium: 4.1 mEq/L (ref 3.5–5.1)
Sodium: 137 mEq/L (ref 135–145)

## 2017-07-17 NOTE — Telephone Encounter (Signed)
BMP needed to be orders for 1 week follow up per last OV with PCP

## 2017-07-26 ENCOUNTER — Other Ambulatory Visit: Payer: Self-pay | Admitting: Internal Medicine

## 2017-07-26 DIAGNOSIS — Z1231 Encounter for screening mammogram for malignant neoplasm of breast: Secondary | ICD-10-CM

## 2017-08-05 ENCOUNTER — Ambulatory Visit: Payer: Medicare HMO | Admitting: Allergy and Immunology

## 2017-08-12 ENCOUNTER — Ambulatory Visit: Payer: Self-pay

## 2017-08-14 ENCOUNTER — Encounter (HOSPITAL_COMMUNITY): Payer: Self-pay

## 2017-08-14 ENCOUNTER — Emergency Department (HOSPITAL_COMMUNITY)
Admission: EM | Admit: 2017-08-14 | Discharge: 2017-08-15 | Disposition: A | Discharge status: Home / Self Care | Payer: Medicare HMO | Attending: Emergency Medicine | Admitting: Emergency Medicine

## 2017-08-14 DIAGNOSIS — I10 Essential (primary) hypertension: Secondary | ICD-10-CM | POA: Insufficient documentation

## 2017-08-14 DIAGNOSIS — J302 Other seasonal allergic rhinitis: Secondary | ICD-10-CM | POA: Diagnosis not present

## 2017-08-14 DIAGNOSIS — J4530 Mild persistent asthma, uncomplicated: Secondary | ICD-10-CM | POA: Diagnosis not present

## 2017-08-14 DIAGNOSIS — F17210 Nicotine dependence, cigarettes, uncomplicated: Secondary | ICD-10-CM | POA: Insufficient documentation

## 2017-08-14 DIAGNOSIS — R194 Change in bowel habit: Secondary | ICD-10-CM | POA: Diagnosis not present

## 2017-08-14 DIAGNOSIS — Z79899 Other long term (current) drug therapy: Secondary | ICD-10-CM | POA: Insufficient documentation

## 2017-08-14 DIAGNOSIS — R5383 Other fatigue: Secondary | ICD-10-CM | POA: Diagnosis present

## 2017-08-14 DIAGNOSIS — R5381 Other malaise: Secondary | ICD-10-CM | POA: Diagnosis not present

## 2017-08-14 DIAGNOSIS — R509 Fever, unspecified: Secondary | ICD-10-CM | POA: Diagnosis not present

## 2017-08-14 NOTE — ED Triage Notes (Signed)
Pt coming from home by ems. Pt states that she doesn't feel well but denise CP, N/V/D at this time. Pt was given 150cc bolus by ems and pt PTA states that she started to feel better

## 2017-08-15 ENCOUNTER — Emergency Department (HOSPITAL_COMMUNITY): Payer: Medicare HMO

## 2017-08-15 DIAGNOSIS — R5381 Other malaise: Secondary | ICD-10-CM | POA: Diagnosis not present

## 2017-08-15 LAB — URINALYSIS, ROUTINE W REFLEX MICROSCOPIC
Bacteria, UA: NONE SEEN
Bilirubin Urine: NEGATIVE
Glucose, UA: NEGATIVE mg/dL
Ketones, ur: NEGATIVE mg/dL
Leukocytes, UA: NEGATIVE
Nitrite: NEGATIVE
Protein, ur: NEGATIVE mg/dL
Specific Gravity, Urine: 1.003 — ABNORMAL LOW (ref 1.005–1.030)
pH: 6 (ref 5.0–8.0)

## 2017-08-15 LAB — BASIC METABOLIC PANEL
Anion gap: 8 (ref 5–15)
BUN: 13 mg/dL (ref 6–20)
CO2: 23 mmol/L (ref 22–32)
Calcium: 8.7 mg/dL — ABNORMAL LOW (ref 8.9–10.3)
Chloride: 105 mmol/L (ref 101–111)
Creatinine, Ser: 0.86 mg/dL (ref 0.44–1.00)
GFR calc Af Amer: >60 mL/min (ref >60)
GFR calc non Af Amer: >60 mL/min (ref >60)
Glucose, Bld: 98 mg/dL (ref 65–99)
Potassium: 3.5 mmol/L (ref 3.5–5.1)
Sodium: 136 mmol/L (ref 135–145)

## 2017-08-15 LAB — CBC WITH DIFFERENTIAL/PLATELET
Basophils Absolute: 0 10*3/uL (ref 0.0–0.1)
Basophils Relative: 0 %
Eosinophils Absolute: 0 10*3/uL (ref 0.0–0.7)
Eosinophils Relative: 0 %
HCT: 35.1 % — ABNORMAL LOW (ref 36.0–46.0)
Hemoglobin: 11.9 g/dL — ABNORMAL LOW (ref 12.0–15.0)
Lymphocytes Relative: 46 %
Lymphs Abs: 2.7 10*3/uL (ref 0.7–4.0)
MCH: 30.2 pg (ref 26.0–34.0)
MCHC: 33.9 g/dL (ref 30.0–36.0)
MCV: 89.1 fL (ref 78.0–100.0)
Monocytes Absolute: 0.3 10*3/uL (ref 0.1–1.0)
Monocytes Relative: 6 %
Neutro Abs: 2.8 10*3/uL (ref 1.7–7.7)
Neutrophils Relative %: 48 %
Platelets: 228 10*3/uL (ref 150–400)
RBC: 3.94 MIL/uL (ref 3.87–5.11)
RDW: 13.6 % (ref 11.5–15.5)
WBC: 5.9 10*3/uL (ref 4.0–10.5)

## 2017-08-15 LAB — I-STAT TROPONIN, ED: Troponin i, poc: 0 ng/mL (ref 0.00–0.08)

## 2017-08-15 IMAGING — DX DG ABDOMEN ACUTE W/ 1V CHEST
3 series · 3 of 3 positions shown · non-contrast
Comparison: CT Abdomen and Pelvis [DATE]. Portable chest
[DATE].

CLINICAL DATA: 76-year-old female with malaise. Difficulty with
bowel movements.

EXAM:
DG ABDOMEN ACUTE W/ 1V CHEST

[chest pa]
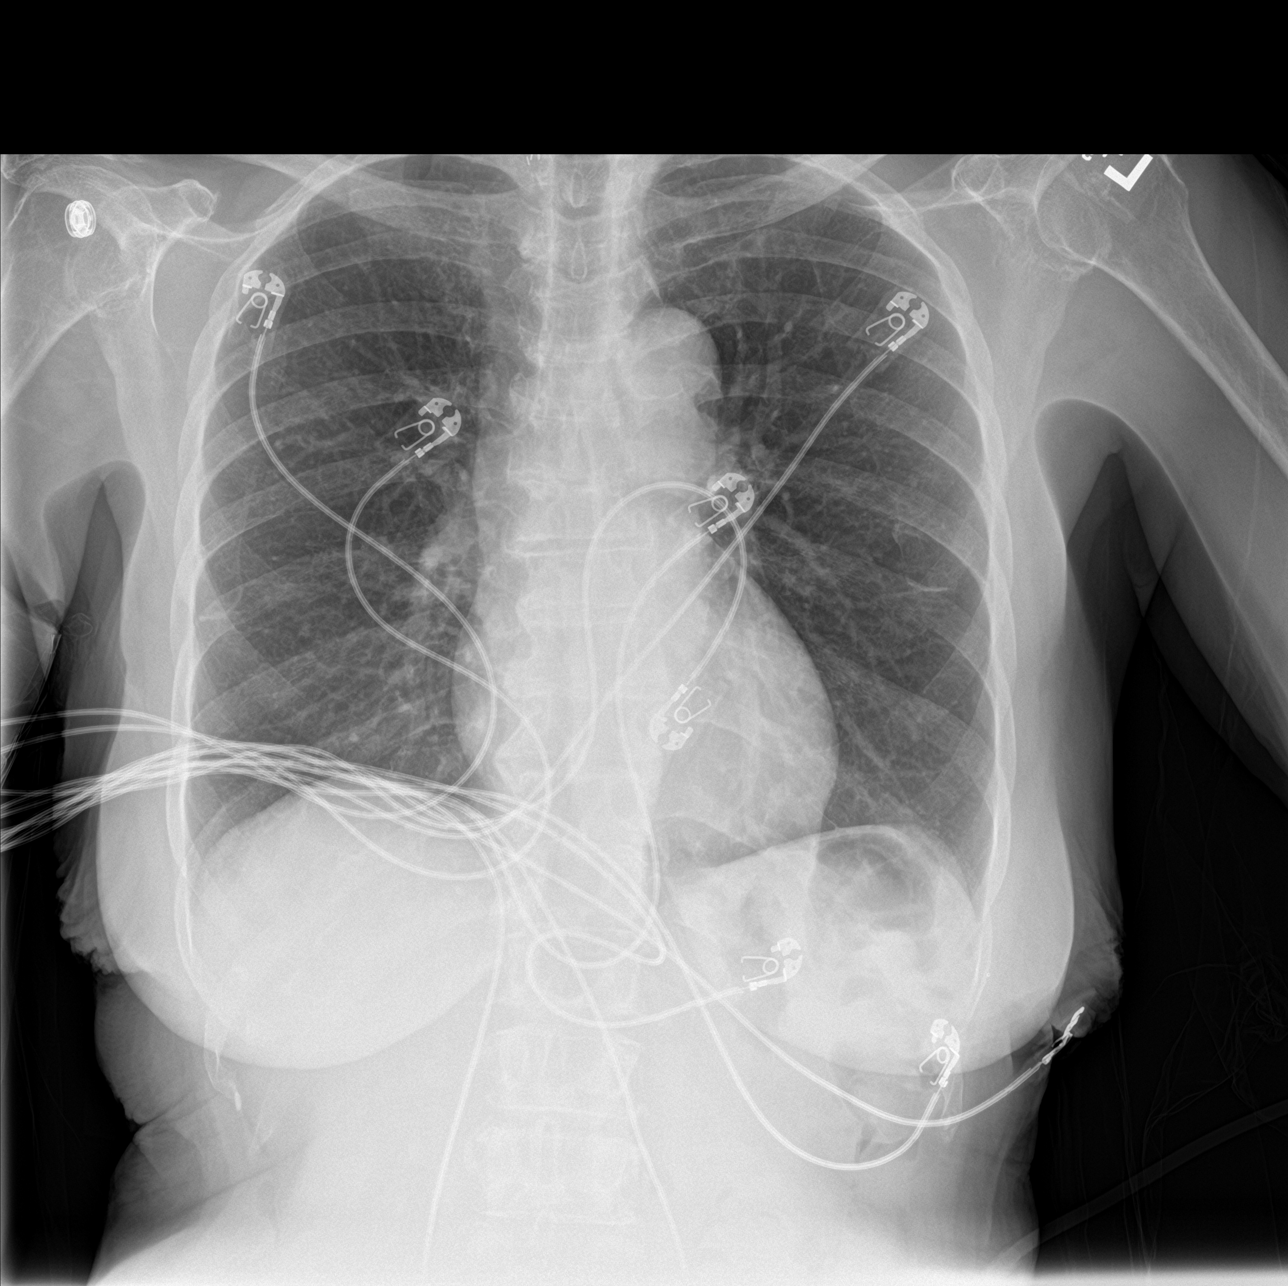

[abdomen erect]
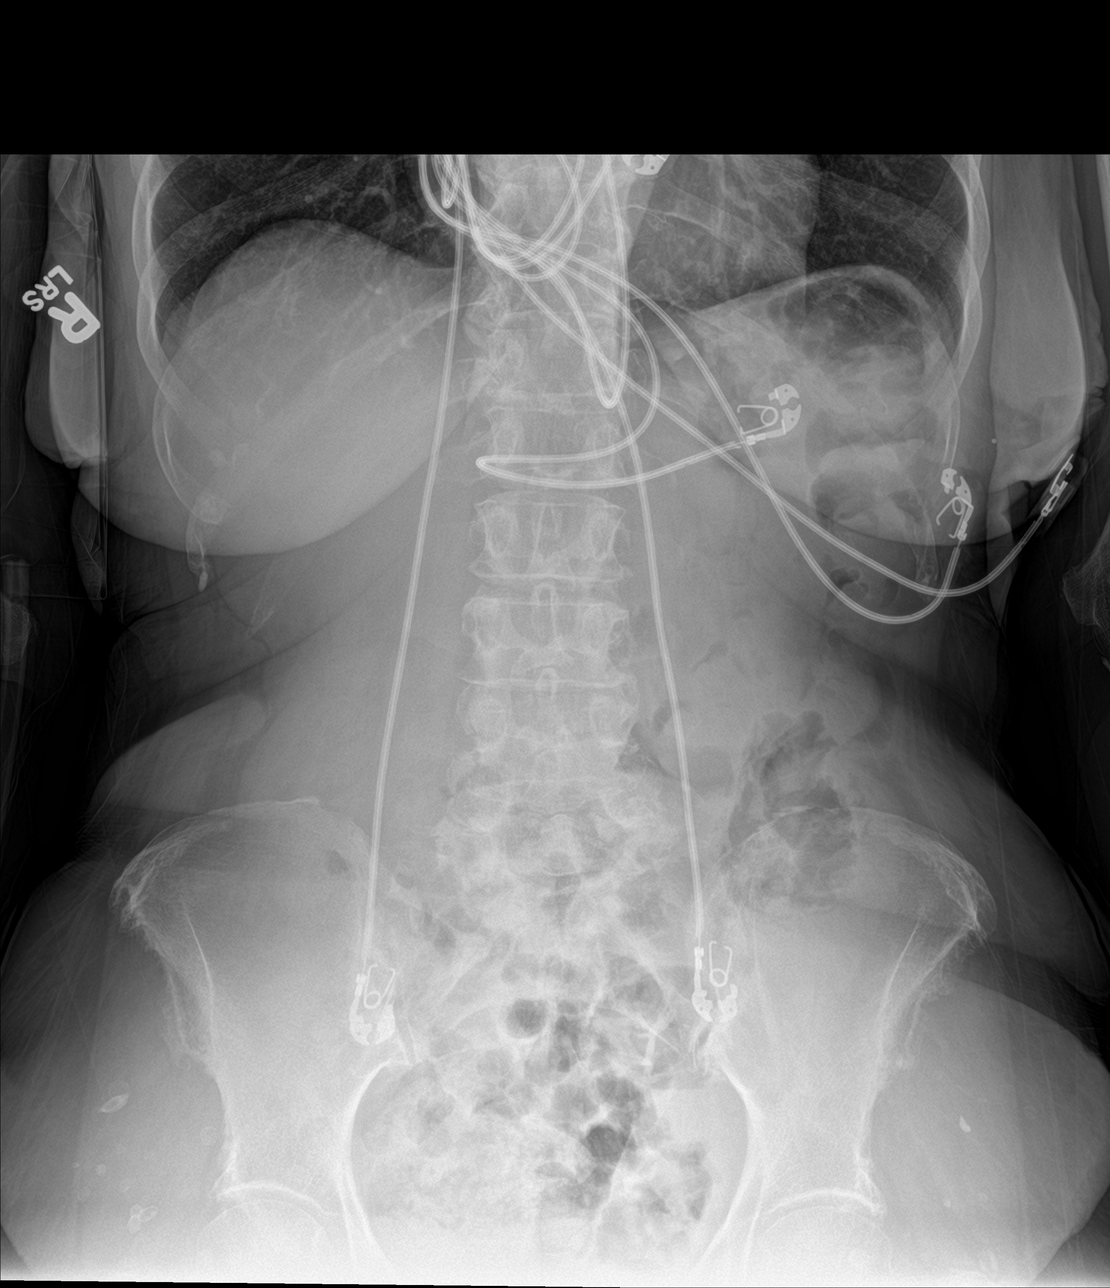

[abdomen supine]
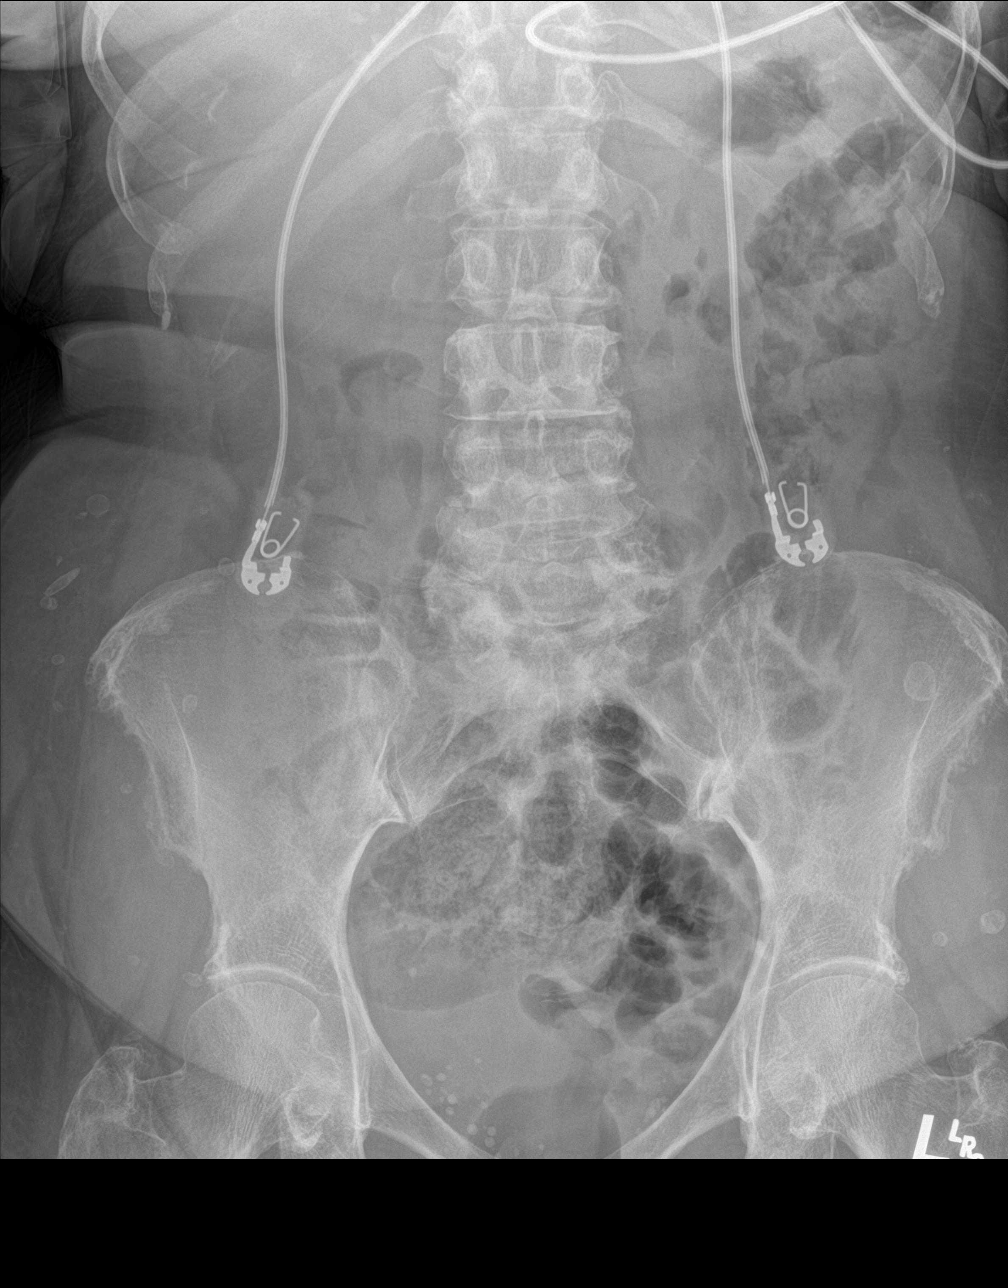

[3 of 3 positions shown; findings below may reference images not displayed]

FINDINGS: Stable lung volumes. The lungs appear stable in clear. No
pneumothorax or pneumoperitoneum. Mediastinal contours are stable
and within normal limits. Right thoracic inlet surgical clips
compatible with prior hemithyroidectomy.

Non obstructed bowel gas pattern. Abdominal and pelvic visceral
contours appear stable. No acute osseous abnormality identified.
Numerous pelvic phleboliths and also bilateral flank calcified
injection granulomas.
IMPRESSION: 1.  Normal bowel gas pattern, no free air.
2.  No acute cardiopulmonary abnormality.

## 2017-08-15 NOTE — Discharge Instructions (Addendum)
All labs and imaging studies today looked great. Follow-up closely with your primary care doctor. Return here for any new/acute changes.

## 2017-08-15 NOTE — ED Provider Notes (Signed)
[facility] EMERGENCY DEPARTMENT Provider Note   CSN: [id number] Arrival date & time: 08/14/17  2310     History   Chief Complaint Chief Complaint  Patient presents with  . not feeling well    HPI Angelica [name] is a 77 y.o. female.  The history is provided by the patient and medical records.    77 year old female with history of asthma, environmental allergies, GERD, glaucoma, hypertension, osteopenia, chronic sinusitis, presenting to the ED with complaint of "not feeling well".  States this is been ongoing for several weeks but worse over the past few days.  States today she just felt very tired and fatigued with no energy.  She reports she has been having some issues with her bowels-- states stool has been hard but took some miralax which made her queasy.  No vomiting.  States appetite has been ok lately, has been drinking fluids.  States she has had some sinus/allergy issues but has been taking her meds for that.  No changes in her meds.  States she felt feverish at home, afebrile here.  No urinary symptoms.  No falls or syncopal events.  After some fluids with IVF states she felt a little better.  Past Medical History:  Diagnosis Date  . Asthma   . Bowel obstruction ([place])   . Environmental allergies   . GERD (gastroesophageal reflux disease)   . Glaucoma 05/09/2017  . H. pylori infection   . Hypertension   . Osteopenia 05/26/2017    Patient Active Problem List   Diagnosis Date Noted  . Hypokalemia 07/09/2017  . Severe persistent asthma 06/04/2017  . 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 Nasal polyposis 06/04/2017  . Leukocytosis 06/04/2017  . Osteopenia 05/26/2017  . Wellness examination 05/09/2017  . Glaucoma 05/09/2017  . GERD (gastroesophageal reflux disease)   . H. pylori infection   . Moderate persistent asthma 03/20/2016  . Other allergic rhinitis 03/20/2016  . Cough, persistent 03/20/2016  . Hypoglycemia 04/17/2015  . Malnutrition of  moderate degree 04/11/2015  . SBO (small bowel obstruction) ([place]) 04/10/2015  . HTN (hypertension) 04/10/2015    Past Surgical History:  Procedure Laterality Date  . ABDOMINAL HYSTERECTOMY    . BREAST BIOPSY    . COLON SURGERY    . KNEE SURGERY    . LAPAROTOMY N/A 04/18/2015   Procedure: Exploratory laparotomy with lysis of adhesions, possible bowel resection;  Surgeon: Ralene Ok, MD;  Location: [facility];  Service: General;  Laterality: N/A;  . NASAL SINUS SURGERY    . THYROID SURGERY       OB History   None      Home Medications    Prior to Admission medications   Medication Sig Start Date End Date Taking? Authorizing Provider  acetaminophen (TYLENOL) 500 MG tablet Take 500 mg by mouth daily as needed for mild pain, moderate pain, fever or headache. For pain    [provider]  albuterol (PROVENTIL) (2.5 MG/3ML) 0.083% nebulizer solution Take 3 mLs (2.5 mg total) by nebulization 2 (two) times daily as needed for wheezing or shortness of breath. 07/05/16   Bobbitt, Sedalia Muta, MD  albuterol (VENTOLIN HFA) 108 (90 Base) MCG/ACT inhaler Inhale 2 puffs into the lungs every 4 (four) hours as needed for wheezing or shortness of breath. 08/28/16   Bobbitt, Sedalia Muta, MD  azelastine (ASTELIN) 0.1 % nasal spray Place 2 sprays into both nostrils 2 (two) times daily. 12/31/16   Bobbitt, Sedalia Muta, MD  budesonide (PULMICORT)  0.5 MG/2ML nebulizer solution 2 mL mixed in nasal irrigation daily as needed. 06/04/17   Bobbitt, Sedalia Muta, MD  budesonide-formoterol (SYMBICORT) 160-4.5 MCG/ACT inhaler Inhale 2 puffs into the lungs 2 (two) times daily.    [provider]  cholecalciferol (VITAMIN D) 1000 units tablet Take 2,000 Units by mouth daily.    [provider]  EPINEPHrine (EPIPEN 2-PAK) 0.3 mg/0.3 mL IJ SOAJ injection Use as directed for severe allergic reaction 06/17/17   Bobbitt, Sedalia Muta, MD  ipratropium (ATROVENT) 0.06 % nasal spray USE ONE SPRAY(S)  IN EACH NOSTRIL THREE TIMES DAILY Patient taking differently: USE ONE SPRAY(S) IN EACH NOSTRIL TWICE  DAILY 01/10/17   Bobbitt, Sedalia Muta, MD  losartan-hydrochlorothiazide (HYZAAR) 100-12.5 MG tablet Take 1 tablet by mouth daily. 06/04/17   [name], MD  montelukast (SINGULAIR) 10 MG tablet Take 1 tablet (10 mg total) by mouth at bedtime. 04/10/17   Kozlow, Donnamarie Poag, MD  omeprazole (PRILOSEC) 40 MG capsule Take 40 mg by mouth daily. For acid reflux    [provider]  polyethylene glycol powder (GLYCOLAX/MIRALAX) powder Take 17 g by mouth daily.    [provider]  potassium chloride (K-DUR) 10 MEQ tablet Take 1 tablet (10 mEq total) by mouth daily. 07/09/17   [name], MD  Tiotropium Bromide Monohydrate (SPIRIVA RESPIMAT) 1.25 MCG/ACT AERS Inhale 2 puffs into the lungs daily. 06/04/17   Bobbitt, Sedalia Muta, MD  vitamin B-12 (CYANOCOBALAMIN) 1000 MCG tablet Take 1,000 mcg by mouth daily.    [provider]    Family History Family History  Problem Relation Age of Onset  . Allergic rhinitis Neg Hx   . Angioedema Neg Hx   . Asthma Neg Hx   . Eczema Neg Hx   . Immunodeficiency Neg Hx   . Urticaria Neg Hx     Social History Social History   Tobacco Use  . Smoking status: Former Smoker    Types: Cigarettes  . Smokeless tobacco: Never Used  Substance Use Topics  . Alcohol use: No  . Drug use: No     Allergies   Asa buff (mag [buffered aspirin]; Fish allergy; Other; Peanut-containing drug products; Penicillins; Shellfish-derived products; and Strawberry extract   Review of Systems Review of Systems  Constitutional:       Not feeling well  All other systems reviewed and are negative.    Physical Exam Updated Vital Signs BP 136/82 (BP Location: Right Arm)   Pulse 74   Temp 98.7 F (37.1 C) (Oral)   Resp 16   Ht 5\' 5"  (1.651 m)   Wt 67.6 kg (149 lb)   SpO2 100%   BMI 24.79 kg/m   Physical Exam  Constitutional: She is oriented to  person, place, and time. She appears well-developed and well-nourished.  Elderly, appears well  HENT:  Head: Normocephalic and atraumatic.  Mouth/Throat: Oropharynx is clear and moist.  Eyes: Pupils are equal, round, and reactive to light. Conjunctivae and EOM are normal.  Neck: Normal range of motion.  Cardiovascular: Normal rate, regular rhythm and normal heart sounds.  Pulmonary/Chest: Effort normal and breath sounds normal. No stridor. No respiratory distress.  Abdominal: Soft. Bowel sounds are normal. There is no tenderness. There is no rebound.  Musculoskeletal: Normal range of motion.  Neurological: She is alert and oriented to person, place, and time.  Skin: Skin is warm and dry.  Psychiatric: She has a normal mood and affect.  Nursing note and vitals reviewed.  ED Treatments / Results  Labs (all labs ordered are listed, but only abnormal results are displayed) Labs Reviewed  CBC WITH DIFFERENTIAL/PLATELET - Abnormal; Notable for the following components:      Result Value   Hemoglobin 11.9 (*)    HCT 35.1 (*)    All other components within normal limits  BASIC METABOLIC PANEL - Abnormal; Notable for the following components:   Calcium 8.7 (*)    All other components within normal limits  URINALYSIS, ROUTINE W REFLEX MICROSCOPIC - Abnormal; Notable for the following components:   Color, Urine STRAW (*)    Specific Gravity, Urine 1.003 (*)    Hgb urine dipstick SMALL (*)    Squamous Epithelial / LPF 0-5 (*)    All other components within normal limits  I-STAT TROPONIN, ED    EKG EKG Interpretation  Date/Time:  Thursday August 15 2017 00:01:03 EDT Ventricular Rate:  72 PR Interval:    QRS Duration: 90 QT Interval:  403 QTC Calculation: 441 R Axis:   -15 Text Interpretation:  Sinus rhythm Probable left atrial enlargement Borderline left axis deviation Abnormal R-wave progression, early transition No significant change since last tracing Confirmed by Pryor Curia [phone]) on 08/15/2017 12:15:04 AM   Radiology Dg Abd Acute W/chest  Result Date: 08/15/2017 CLINICAL DATA:  77 year old female with malaise. Difficulty with bowel movements. EXAM: DG ABDOMEN ACUTE W/ 1V CHEST COMPARISON:  CT Abdomen and Pelvis 02/04/2017. Portable chest 07/06/2017. FINDINGS: Stable lung volumes. The lungs appear stable in clear. No pneumothorax or pneumoperitoneum. Mediastinal contours are stable and within normal limits. 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 Non obstructed bowel gas pattern. Abdominal and pelvic visceral contours appear stable. No acute osseous abnormality identified. Numerous pelvic phleboliths and also bilateral flank calcified injection granulomas. IMPRESSION: 1.  Normal bowel gas pattern, no free air. 2.  No acute cardiopulmonary abnormality. Electronically Signed   By: Genevie Ann M.D.   On: 08/15/2017 00:34    Procedures Procedures (including critical care time)  Medications Ordered in ED Medications - No data to display   Initial Impression / Assessment and Plan / ED Course  I have reviewed the triage vital signs and the nursing notes.  Pertinent labs & imaging results that were available during my care of the patient were reviewed by me and considered in my medical decision making (see chart for details).  77 year old female here with chief complaint of "not feeling well".  This apparently has been ongoing for several weeks but worse over the past few days.  Patient's only true symptom is some ongoing allergy/sinus issues and issues with BM's-- both of these issues are chronic as well.  She is afebrile and nontoxic.  Abdomen is soft and benign without focal tenderness or peritoneal signs.  She has had prior bowel obstruction although given benign exam I have low suspicion for such today.  Will obtain screening labs, urinalysis, and acute abdominal series.  She was given small fluid bolus here.  Patient's labs  are overall reassuring.  UA without any signs of infection.  Acute abdominal series without obstructive process or free air.  Patient is feeling better here after IV fluids.  She was able to get up and ambulate around the ED independently without issue.  Her vitals remained stable.  At this point, no acute findings to warrant hospitalization or ongoing evaluation.  Feel she is stable for discharge home.  Recommended to follow-up closely with her primary care doctor.  She understands to return here for any new or worsening symptoms.  Patient seen and evaluated with attending physician, Dr. Leonides Schanz, who agrees with assessment and plan of care.  Final Clinical Impressions(s) / ED Diagnoses   Final diagnoses:  Seasonal allergies  Change in bowel habits    ED Discharge Orders    None       Larene Pickett, PA-C 08/15/17 0601    Ward, Delice Bison, DO 08/15/17 [phone]

## 2017-08-19 ENCOUNTER — Ambulatory Visit (INDEPENDENT_AMBULATORY_CARE_PROVIDER_SITE_OTHER): Payer: Medicare HMO | Admitting: *Deleted

## 2017-08-19 ENCOUNTER — Encounter: Payer: Self-pay | Admitting: Internal Medicine

## 2017-08-19 ENCOUNTER — Ambulatory Visit (INDEPENDENT_AMBULATORY_CARE_PROVIDER_SITE_OTHER): Payer: Medicare HMO | Admitting: Internal Medicine

## 2017-08-19 VITALS — BP 124/76 | HR 63 | Temp 98.6°F | Ht 65.0 in | Wt 149.0 lb

## 2017-08-19 DIAGNOSIS — K5900 Constipation, unspecified: Secondary | ICD-10-CM | POA: Diagnosis not present

## 2017-08-19 DIAGNOSIS — I10 Essential (primary) hypertension: Secondary | ICD-10-CM

## 2017-08-19 DIAGNOSIS — J4550 Severe persistent asthma, uncomplicated: Secondary | ICD-10-CM | POA: Diagnosis not present

## 2017-08-19 DIAGNOSIS — J3089 Other allergic rhinitis: Secondary | ICD-10-CM | POA: Diagnosis not present

## 2017-08-19 MED ORDER — LOSARTAN POTASSIUM 100 MG PO TABS
100.0000 mg | ORAL_TABLET | Freq: Every day | ORAL | 0 refills | Status: DC
Start: 2017-08-19 — End: 2017-08-19

## 2017-08-19 MED ORDER — LOSARTAN POTASSIUM 100 MG PO TABS: 100.0000 mg | ORAL_TABLET | Freq: Every day | ORAL | 3 refills | Status: DC

## 2017-08-19 NOTE — Assessment & Plan Note (Addendum)
Controlled, will try change losartan HCT to losartan in light of ongoing constipation, follow BP closely, also d/c K since d/c hct

## 2017-08-19 NOTE — Assessment & Plan Note (Signed)
Some improved, cont otc laxative prn

## 2017-08-19 NOTE — Patient Instructions (Addendum)
Ok to stop the losartan HCT and the potassium pill  Please take all new medication as prescribed - the losartan 100 mg per day (the HCT is taken out)  OK to consider Senakot daily instead of Miralax  Please continue all other medications as before, and refills have been done if requested.  Please have the pharmacy call with any other refills you may need.  Please keep your appointments with your specialists as you may have planned

## 2017-08-19 NOTE — Assessment & Plan Note (Signed)
To cont allergy shots, for OTC claritin prn

## 2017-08-19 NOTE — Progress Notes (Signed)
Subjective:    Patient ID: Angelica [name], female    DOB: 09-11-1940, 77 y.o.   MRN: [id number]  HPI  Here to f/u recent constipation eval at ED visit 3/27, now improved with otc laxative, but asking if meds could be contributing, as she is drinking plenty of fluids, but has been some less active recent months as well.  Pt denies chest pain, increased sob or doe, wheezing, orthopnea, PND, increased LE swelling, palpitations, dizziness or syncope.  Pt denies new neurological symptoms such as new headache, or facial or extremity weakness or numbness  Does have several wks ongoing nasal allergy symptoms with clearish congestion, itch and sneezing, without fever, pain, ST, cough, swelling or wheezing. Past Medical History:  Diagnosis Date  . Asthma   . Bowel obstruction ([place])   . Environmental allergies   . GERD (gastroesophageal reflux disease)   . Glaucoma 05/09/2017  . H. pylori infection   . Hypertension   . Osteopenia 05/26/2017   Past Surgical History:  Procedure Laterality Date  . ABDOMINAL HYSTERECTOMY    . BREAST BIOPSY    . COLON SURGERY    . KNEE SURGERY    . LAPAROTOMY N/A 04/18/2015   Procedure: Exploratory laparotomy with lysis of adhesions, possible bowel resection;  Surgeon: Ralene Ok, MD;  Location: [facility];  Service: General;  Laterality: N/A;  . NASAL SINUS SURGERY    . THYROID SURGERY      reports that she has quit smoking. Her smoking use included cigarettes. She has never used smokeless tobacco. She reports that she does not drink alcohol or use drugs. family history is not on file. Allergies  Allergen Reactions  . Asa Buff (Mag [Buffered Aspirin] Other (See Comments)    Excessive sweating  . Fish Allergy Other (See Comments)    Unknown reaction  . Other Other (See Comments)    Gelcaps; gel-containing capsules; extended release tablets  . Peanut-Containing Drug Products Other (See Comments)    From allergy test  . Penicillins Itching    Has patient had a  PCN reaction causing immediate rash, facial/tongue/throat swelling, SOB or lightheadedness with hypotension: No Has patient had a PCN reaction causing severe rash involving mucus membranes or skin necrosis: No Has patient had a PCN reaction that required hospitalization No Has patient had a PCN reaction occurring within the last 10 years: No If all of the above answers are "NO", then may proceed with Cephalosporin use.  Marland Kitchen Shellfish-Derived Products Other (See Comments)    From allergy test  . Strawberry Extract Other (See Comments)    Worsened allergy symptoms   Current Outpatient Medications on File Prior to Visit  Medication Sig Dispense Refill  . acetaminophen (TYLENOL) 500 MG tablet Take 500 mg by mouth daily as needed for mild pain, moderate pain, fever or headache. For pain    . albuterol (PROVENTIL) (2.5 MG/3ML) 0.083% nebulizer solution Take 3 mLs (2.5 mg total) by nebulization 2 (two) times daily as needed for wheezing or shortness of breath. 75 mL 0  . albuterol (VENTOLIN HFA) 108 (90 Base) MCG/ACT inhaler Inhale 2 puffs into the lungs every 4 (four) hours as needed for wheezing or shortness of breath. 1 Inhaler 1  . azelastine (ASTELIN) 0.1 % nasal spray Place 2 sprays into both nostrils 2 (two) times daily. 30 mL 5  . budesonide (PULMICORT) 0.5 MG/2ML nebulizer solution 2 mL mixed in nasal irrigation daily as needed. 60 mL 5  . budesonide-formoterol (SYMBICORT) 160-4.5 MCG/ACT  inhaler Inhale 2 puffs into the lungs 2 (two) times daily.    . cholecalciferol (VITAMIN D) 1000 units tablet Take 2,000 Units by mouth daily.    Marland Kitchen EPINEPHrine (EPIPEN 2-PAK) 0.3 mg/0.3 mL IJ SOAJ injection Use as directed for severe allergic reaction 2 Device 1  . ipratropium (ATROVENT) 0.06 % nasal spray USE ONE SPRAY(S) IN EACH NOSTRIL THREE TIMES DAILY (Patient taking differently: USE ONE SPRAY(S) IN EACH NOSTRIL TWICE  DAILY) 15 mL 5  . montelukast (SINGULAIR) 10 MG tablet Take 1 tablet (10 mg total) by  mouth at bedtime. 30 tablet 5  . omeprazole (PRILOSEC) 40 MG capsule Take 40 mg by mouth daily. For acid reflux    . polyethylene glycol powder (GLYCOLAX/MIRALAX) powder Take 17 g by mouth daily.    . Tiotropium Bromide Monohydrate (SPIRIVA RESPIMAT) 1.25 MCG/ACT AERS Inhale 2 puffs into the lungs daily. 4 g 5  . vitamin B-12 (CYANOCOBALAMIN) 1000 MCG tablet Take 1,000 mcg by mouth daily.     No current facility-administered medications on file prior to visit.    Review of Systems  Constitutional: Negative for other unusual diaphoresis or sweats HENT: Negative for ear discharge or swelling Eyes: Negative for other worsening visual disturbances Respiratory: Negative for stridor or other swelling  Gastrointestinal: Negative for worsening distension or other blood Genitourinary: Negative for retention or other urinary change Musculoskeletal: Negative for other MSK pain or swelling Skin: Negative for color change or other new lesions Neurological: Negative for worsening tremors and other numbness  Psychiatric/Behavioral: Negative for worsening agitation or other fatigue All other system neg per pt    Objective:   Physical Exam BP 124/76   Pulse 63   Temp 98.6 F (37 C) (Oral)   Ht 5\' 5"  (1.651 m)   Wt 149 lb (67.6 kg)   SpO2 96%   BMI 24.79 kg/m  VS noted,  Constitutional: Pt appears in NAD HENT: Head: NCAT.  Right Ear: External ear normal.  Left Ear: External ear normal.  Eyes: . Pupils are equal, round, and reactive to light. Conjunctivae and EOM are normal Bilat tm's with mild erythema.  Max sinus areas non tender.  Pharynx with mild erythema, no exudate Nose: without d/c or deformity Neck: Neck supple. Gross normal ROM Cardiovascular: Normal rate and regular rhythm.   Pulmonary/Chest: Effort normal and breath sounds without rales or wheezing.  Abd:  Soft, NT, ND, + BS, no organomegaly Neurological: Pt is alert. At baseline orientation, motor grossly intact Skin: Skin is  warm. No rashes, other new lesions, no LE edema Psychiatric: Pt behavior is normal without agitation  No other exam findings    Assessment & Plan:

## 2017-08-20 DIAGNOSIS — H04123 Dry eye syndrome of bilateral lacrimal glands: Secondary | ICD-10-CM | POA: Diagnosis not present

## 2017-08-20 DIAGNOSIS — H40023 Open angle with borderline findings, high risk, bilateral: Secondary | ICD-10-CM | POA: Diagnosis not present

## 2017-08-20 DIAGNOSIS — H1013 Acute atopic conjunctivitis, bilateral: Secondary | ICD-10-CM | POA: Diagnosis not present

## 2017-08-21 ENCOUNTER — Ambulatory Visit
Admission: RE | Admit: 2017-08-21 | Discharge: 2017-08-21 | Disposition: A | Discharge status: Home / Self Care | Payer: Medicare HMO | Source: Ambulatory Visit | Attending: Internal Medicine | Admitting: Internal Medicine

## 2017-08-21 DIAGNOSIS — Z1231 Encounter for screening mammogram for malignant neoplasm of breast: Secondary | ICD-10-CM | POA: Diagnosis not present

## 2017-09-18 ENCOUNTER — Ambulatory Visit: Payer: Medicare HMO | Admitting: Internal Medicine

## 2017-09-24 ENCOUNTER — Ambulatory Visit: Payer: Medicare HMO | Admitting: Allergy and Immunology

## 2017-09-24 ENCOUNTER — Encounter: Payer: Self-pay | Admitting: Allergy and Immunology

## 2017-09-24 VITALS — BP 146/78 | HR 60 | Resp 16

## 2017-09-24 DIAGNOSIS — K219 Gastro-esophageal reflux disease without esophagitis: Secondary | ICD-10-CM

## 2017-09-24 DIAGNOSIS — J4550 Severe persistent asthma, uncomplicated: Secondary | ICD-10-CM | POA: Diagnosis not present

## 2017-09-24 DIAGNOSIS — J339 Nasal polyp, unspecified: Secondary | ICD-10-CM | POA: Diagnosis not present

## 2017-09-24 DIAGNOSIS — J3089 Other allergic rhinitis: Secondary | ICD-10-CM | POA: Diagnosis not present

## 2017-09-24 DIAGNOSIS — J324 Chronic pansinusitis: Secondary | ICD-10-CM | POA: Diagnosis not present

## 2017-09-24 DIAGNOSIS — D8989 Other specified disorders involving the immune mechanism, not elsewhere classified: Secondary | ICD-10-CM | POA: Diagnosis not present

## 2017-09-24 NOTE — Progress Notes (Signed)
Follow-up Note  Referring Provider: [name], MD Primary Provider: [name], MD Date of Office Visit: 09/24/2017  Subjective:   Angelica [name] (DOB: November 29, 1940) is a 77 y.o. female who returns to the [facility] on 09/24/2017 in re-evaluation of the following:  HPI: [name] returns to this clinic in reevaluation of her severe asthma, allergic rhinitis, chronic sinusitis, nasal polyposis.  I last saw her in this clinic 09 April 2017 at which point in time we established the presence of eosinophilia and chronic sinusitis and nasal polyposis and placed her on Fasenra among other therapy.  Overall she thinks that she is doing very well.  She thinks her asthma is under excellent control and she rarely uses a short acting bronchodilator.  She has not required a systemic steroid or antibiotic for any type of respiratory tract issue since I have seen her in this clinic.  She believes that her nose is doing well with less congestion but she still cannot smell that well.  Her smell has improved but not to the extent that she can discern different types of foods.  Her reflux is under pretty good control although she occasionally does have drainage and some throat clearing.  Allergies as of 09/24/2017      Reactions   Asa Buff (mag [buffered Aspirin] Other (See Comments)   Excessive sweating   Fish Allergy Other (See Comments)   Unknown reaction   Other Other (See Comments)   Gelcaps; gel-containing capsules; extended release tablets   Peanut-containing Drug Products Other (See Comments)   From allergy test   Penicillins Itching   Has patient had a PCN reaction causing immediate rash, facial/tongue/throat swelling, SOB or lightheadedness with hypotension: No Has patient had a PCN reaction causing severe rash involving mucus membranes or skin necrosis: No Has patient had a PCN reaction that required hospitalization No Has patient had a PCN reaction occurring within  the last 10 years: No If all of the above answers are "NO", then may proceed with Cephalosporin use.   Shellfish-derived Products Other (See Comments)   From allergy test   Strawberry Extract Other (See Comments)   Worsened allergy symptoms      Medication List      acetaminophen 500 MG tablet Commonly known as:  TYLENOL Take 500 mg by mouth daily as needed for mild pain, moderate pain, fever or headache. For pain   albuterol (2.5 MG/3ML) 0.083% nebulizer solution Commonly known as:  PROVENTIL Take 3 mLs (2.5 mg total) by nebulization 2 (two) times daily as needed for wheezing or shortness of breath.   albuterol 108 (90 Base) MCG/ACT inhaler Commonly known as:  VENTOLIN HFA Inhale 2 puffs into the lungs every 4 (four) hours as needed for wheezing or shortness of breath.   budesonide-formoterol 160-4.5 MCG/ACT inhaler Commonly known as:  SYMBICORT Inhale 2 puffs into the lungs 2 (two) times daily.   cholecalciferol 1000 units tablet Commonly known as:  VITAMIN D Take 2,000 Units by mouth daily.   EPINEPHrine 0.3 mg/0.3 mL Soaj injection Commonly known as:  EPIPEN 2-PAK Use as directed for severe allergic reaction   ipratropium 0.06 % nasal spray Commonly known as:  ATROVENT Place 1 spray into both nostrils 3 (three) times daily.   losartan 100 MG tablet Commonly known as:  COZAAR Take 1 tablet (100 mg total) by mouth daily.   montelukast 10 MG tablet Commonly known as:  SINGULAIR Take 1 tablet (10 mg  total) by mouth at bedtime.   omeprazole 40 MG capsule Commonly known as:  PRILOSEC Take 40 mg by mouth daily. For acid reflux   polyethylene glycol powder powder Commonly known as:  GLYCOLAX/MIRALAX Take 17 g by mouth daily.   Tiotropium Bromide Monohydrate 1.25 MCG/ACT Aers Commonly known as:  SPIRIVA RESPIMAT Inhale 2 puffs into the lungs daily.   vitamin B-12 1000 MCG tablet Commonly known as:  CYANOCOBALAMIN Take 1,000 mcg by mouth daily.       Past  Medical History:  Diagnosis Date  . Asthma   . Bowel obstruction ([place])   . Environmental allergies   . GERD (gastroesophageal reflux disease)   . Glaucoma 05/09/2017  . H. pylori infection   . Hypertension   . Osteopenia 05/26/2017    Past Surgical History:  Procedure Laterality Date  . ABDOMINAL HYSTERECTOMY    . BREAST BIOPSY    . COLON SURGERY    . KNEE SURGERY    . LAPAROTOMY N/A 04/18/2015   Procedure: Exploratory laparotomy with lysis of adhesions, possible bowel resection;  Surgeon: Ralene Ok, MD;  Location: [facility];  Service: General;  Laterality: N/A;  . NASAL SINUS SURGERY    . THYROID SURGERY      Review of systems negative except as noted in HPI / PMHx or noted below:  Review of Systems  Constitutional: Negative.   HENT: Negative.   Eyes: Negative.   Respiratory: Negative.   Cardiovascular: Negative.   Gastrointestinal: Negative.   Genitourinary: Negative.   Musculoskeletal: Negative.   Skin: Negative.   Neurological: Negative.   Endo/Heme/Allergies: Negative.   Psychiatric/Behavioral: Negative.      Objective:   Vitals:   09/24/17 1013  BP: (!) 146/78  Pulse: 60  Resp: 16          Physical Exam  HENT:  Head: Normocephalic.  Right Ear: Tympanic membrane, external ear and ear canal normal.  Left Ear: Tympanic membrane, external ear and ear canal normal.  Nose: Mucosal edema (Right nasal polyposis) present. No rhinorrhea.  Mouth/Throat: Uvula is midline, oropharynx is clear and moist and mucous membranes are normal. No oropharyngeal exudate.  Eyes: Conjunctivae are normal.  Neck: Trachea normal. No tracheal tenderness present. No tracheal deviation present. No thyromegaly present.  Cardiovascular: Normal rate, regular rhythm, S1 normal, S2 normal and normal heart sounds.  No murmur heard. Pulmonary/Chest: Breath sounds normal. No stridor. No respiratory distress. She has no wheezes. She has no rales.  Musculoskeletal: She exhibits no edema.   Lymphadenopathy:       Head (right side): No tonsillar adenopathy present.       Head (left side): No tonsillar adenopathy present.    She has no cervical adenopathy.  Neurological: She is alert.  Skin: No rash noted. She is not diaphoretic. No erythema. Nails show no clubbing.    Diagnostics:    Spirometry was performed and demonstrated an FEV1 of 1.81 at 105 % of predicted.  The patient had an Asthma Control Test with the following results: ACT Total Score: 24.    Results of a sinus CT scan obtained 19 April 2017 identified the following:  Near complete opacification of the frontal sinuses and ethmoid air cells. Extensive mucosal thickening throughout the maxillary sinuses and sphenoid sinuses as well with air-fluid levels noted in these sinuses. Findings compatible with extensive acute on chronic sinusitis.  Results of a head CT scan obtained 06 July 2017 identified the following:  Sinuses/Orbits: Marked near complete opacification  of the included frontal, ethmoid and sphenoid sinuses with near complete opacification of the right maxillary sinus. Mild mucosal thickening of the included left maxillary sinus. Small osteoma in the left frontal sinus. Bilateral lens replacements. Intact orbits and Globes.  Results of blood tests obtained 09 April 2017 identified WBC 8.8, absolute eosinophil 700, absolute lymphocyte 3600, hemoglobin 13.1, platelet 355, IgG 1918 mg/DL, IgM 69 mg/DL, IgA 288 mg/DL, IgE 314 IU/mL.  Results of blood tests obtained 07 June 2017 identified an increase in kappa free light chain at 37 mg/L.  Immunophenotyping identified no apparent monoclonal B-cell or T-cell population.  Assessment and Plan:   1. Asthma, severe persistent, well-controlled   2. Nasal polyposis   3. Other allergic rhinitis   4. Chronic pansinusitis   5. 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 Symbicort 160 - 2 inhalations  twice a day   2. Spiriva 1.25 Respimat - 2 inhalations 1 time per day  3.  Flonase -1 spray each nostril twice a day  4. Montelukast 10mg  -1 tablet 1 time per day  5.  Omeprazole 40mg  1 tablet 1 time per day  6. Fasenra every 8 weeks  7.  If needed:   A.  Ventolin HFA 2 puffs or albuterol nebulization every 4-6 hours  B.  OTC antihistamine and OTC Mucinex DM  C. Nasal saline spray  D. Ipratropium 0.06% 2 sprays each nostril 3 times per day  8. 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  I have asked her to consistently use nasal steroids to address her chronic upper airway inflammation and nasal polyposis while she continues on all of her other therapy.  As well, she needs to remain on reflux therapy given her LPR.  I will see her back in this clinic in 12 weeks or earlier if there is a problem.  At this point there is no need to have her undergo further evaluation for her kappa light chain overproduction given the results of her previous blood tests. We can recheck her levels this summer.  Allena Katz, MD Allergy / Immunology [facility]

## 2017-09-24 NOTE — Patient Instructions (Addendum)
  1. Symbicort 160 - 2 inhalations twice a day   2. Spiriva 1.25 Respimat - 2 inhalations 1 time per day  3.  Flonase -1 spray each nostril twice a day  4. Montelukast 10mg  -1 tablet 1 time per day  5.  Omeprazole 40mg  1 tablet 1 time per day  6. Fasenra every 8 weeks  7.  If needed:   A.  Ventolin HFA 2 puffs or albuterol nebulization every 4-6 hours  B.  OTC antihistamine and OTC Mucinex DM  C. Nasal saline spray  D. Ipratropium 0.06% 2 sprays each nostril 3 times per day  8. Return to clinic in 12 weeks or earlier if problem

## 2017-09-25 ENCOUNTER — Encounter: Payer: Self-pay | Admitting: Allergy and Immunology

## 2017-10-07 ENCOUNTER — Other Ambulatory Visit: Payer: Self-pay | Admitting: Allergy and Immunology

## 2017-10-07 DIAGNOSIS — J4541 Moderate persistent asthma with (acute) exacerbation: Secondary | ICD-10-CM

## 2017-10-09 ENCOUNTER — Telehealth: Payer: Self-pay | Admitting: Allergy and Immunology

## 2017-10-09 NOTE — Telephone Encounter (Signed)
Patient said she takes Montelukast and she believes she is breaking out from it. She stopped taking it and the breakout cleared up.

## 2017-10-09 NOTE — Telephone Encounter (Signed)
Please have patient remain off montelukast.

## 2017-10-09 NOTE — Telephone Encounter (Signed)
Patient informed. 

## 2017-10-10 ENCOUNTER — Ambulatory Visit (INDEPENDENT_AMBULATORY_CARE_PROVIDER_SITE_OTHER): Payer: Medicare HMO | Admitting: Internal Medicine

## 2017-10-10 ENCOUNTER — Encounter: Payer: Self-pay | Admitting: Internal Medicine

## 2017-10-10 VITALS — BP 122/76 | HR 76 | Temp 97.9°F | Ht 65.0 in | Wt 150.0 lb

## 2017-10-10 DIAGNOSIS — J3089 Other allergic rhinitis: Secondary | ICD-10-CM | POA: Diagnosis not present

## 2017-10-10 DIAGNOSIS — J4550 Severe persistent asthma, uncomplicated: Secondary | ICD-10-CM

## 2017-10-10 DIAGNOSIS — R21 Rash and other nonspecific skin eruption: Secondary | ICD-10-CM | POA: Diagnosis not present

## 2017-10-10 DIAGNOSIS — I10 Essential (primary) hypertension: Secondary | ICD-10-CM

## 2017-10-10 MED ORDER — HYDROCHLOROTHIAZIDE 12.5 MG PO CAPS: 12.5000 mg | ORAL_CAPSULE | Freq: Every day | ORAL | 3 refills | Status: DC

## 2017-10-10 MED ORDER — METHYLPREDNISOLONE ACETATE 80 MG/ML IJ SUSP
80.0000 mg | Freq: Once | INTRAMUSCULAR | Status: AC
  Administered 2017-10-10: 80 mg via INTRAMUSCULAR

## 2017-10-10 MED ORDER — PREDNISONE 10 MG PO TABS: ORAL_TABLET | ORAL | 0 refills | Status: DC

## 2017-10-10 NOTE — Assessment & Plan Note (Signed)
stable overall by history and exam, and pt to continue medical treatment as before,  to f/u any worsening symptoms or concerns 

## 2017-10-10 NOTE — Assessment & Plan Note (Signed)
C/w allergic type, likely drug related, for singulair listed to allergy list, depomedrol IM 80, predpac asd, claritin and topical benadryl as needed for itching

## 2017-10-10 NOTE — Progress Notes (Signed)
Subjective:    Patient ID: Angelica [name], female    DOB: 1941/05/04, 77 y.o.   MRN: [id number]  HPI  Here with acute onset rash apparently related to singulair use per allergy; started early last wk, and has been using cortisone otc for red spots that itch and come and go; she seemed some better with holding the singulair, then took one more dose Monday (3 days ago) with restarting a marked rash with itching that wakes her up mostly to the extremities, and several lesions to right breast.  No fever, does not feel ill so to speak.  Allergy advised her to stop the singulair but this time the rash and itching not really getting better.  She had stopped her claritin per allergy prior to singulair so did not try this.   Pt denies fever, wt loss, night sweats, loss of appetite, or other constitutional symptoms  Pt denies chest pain, increased sob or doe, wheezing, orthopnea, PND,  palpitations, dizziness or syncope though has had some worsening leg swelling after HCt stopped due to dizziness at a recent visit.  Denies other new exposures or bed bugs at home Past Medical History:  Diagnosis Date  . Asthma   . Bowel obstruction ([place])   . Environmental allergies   . GERD (gastroesophageal reflux disease)   . Glaucoma 05/09/2017  . H. pylori infection   . Hypertension   . Osteopenia 05/26/2017   Past Surgical History:  Procedure Laterality Date  . ABDOMINAL HYSTERECTOMY    . BREAST BIOPSY    . COLON SURGERY    . KNEE SURGERY    . LAPAROTOMY N/A 04/18/2015   Procedure: Exploratory laparotomy with lysis of adhesions, possible bowel resection;  Surgeon: Ralene Ok, MD;  Location: [facility];  Service: General;  Laterality: N/A;  . NASAL SINUS SURGERY    . THYROID SURGERY      reports that she has quit smoking. Her smoking use included cigarettes. She has never used smokeless tobacco. She reports that she does not drink alcohol or use drugs. family history is not on file. Allergies  Allergen  Reactions  . Asa Buff (Mag [Buffered Aspirin] Other (See Comments)    Excessive sweating  . Fish Allergy Other (See Comments)    Unknown reaction  . Other Other (See Comments)    Gelcaps; gel-containing capsules; extended release tablets  . Peanut-Containing Drug Products Other (See Comments)    From allergy test  . Penicillins Itching    Has patient had a PCN reaction causing immediate rash, facial/tongue/throat swelling, SOB or lightheadedness with hypotension: No Has patient had a PCN reaction causing severe rash involving mucus membranes or skin necrosis: No Has patient had a PCN reaction that required hospitalization No Has patient had a PCN reaction occurring within the last 10 years: No If all of the above answers are "NO", then may proceed with Cephalosporin use.  Marland Kitchen Shellfish-Derived Products Other (See Comments)    From allergy test  . Singulair [Montelukast Sodium] Hives  . Strawberry Extract Other (See Comments)    Worsened allergy symptoms   Current Outpatient Medications on File Prior to Visit  Medication Sig Dispense Refill  . acetaminophen (TYLENOL) 500 MG tablet Take 500 mg by mouth daily as needed for mild pain, moderate pain, fever or headache. For pain    . albuterol (PROVENTIL) (2.5 MG/3ML) 0.083% nebulizer solution Take 3 mLs (2.5 mg total) by nebulization 2 (two) times daily as needed for wheezing or shortness of  breath. 75 mL 0  . albuterol (VENTOLIN HFA) 108 (90 Base) MCG/ACT inhaler Inhale 2 puffs into the lungs every 4 (four) hours as needed for wheezing or shortness of breath. 1 Inhaler 1  . budesonide-formoterol (SYMBICORT) 160-4.5 MCG/ACT inhaler Inhale 2 puffs into the lungs 2 (two) times daily.    . cholecalciferol (VITAMIN D) 1000 units tablet Take 2,000 Units by mouth daily.    Marland Kitchen EPINEPHrine (EPIPEN 2-PAK) 0.3 mg/0.3 mL IJ SOAJ injection Use as directed for severe allergic reaction 2 Device 1  . ipratropium (ATROVENT) 0.06 % nasal spray Place 1 spray into  both nostrils 3 (three) times daily.    Marland Kitchen losartan (COZAAR) 100 MG tablet Take 1 tablet (100 mg total) by mouth daily. 90 tablet 3  . omeprazole (PRILOSEC) 40 MG capsule Take 40 mg by mouth daily. For acid reflux    . polyethylene glycol powder (GLYCOLAX/MIRALAX) powder Take 17 g by mouth daily.    . Tiotropium Bromide Monohydrate (SPIRIVA RESPIMAT) 1.25 MCG/ACT AERS Inhale 2 puffs into the lungs daily. 4 g 5  . vitamin B-12 (CYANOCOBALAMIN) 1000 MCG tablet Take 1,000 mcg by mouth daily.     No current facility-administered medications on file prior to visit.    ROS:  Constitutional: Negative for other unusual diaphoresis or sweats HENT: Negative for ear discharge or swelling Eyes: Negative for other worsening visual disturbances Respiratory: Negative for stridor or other swelling  Gastrointestinal: Negative for worsening distension or other blood Genitourinary: Negative for retention or other urinary change Musculoskeletal: Negative for other MSK pain or swelling Skin: Negative for color change or other new lesions Neurological: Negative for worsening tremors and other numbness  Psychiatric/Behavioral: Negative for worsening agitation or other fatigue All other system neg per pt    Objective:   Physical Exam BP 122/76   Pulse 76   Temp 97.9 F (36.6 C) (Oral)   Ht 5\' 5"  (1.651 m)   Wt 150 lb (68 kg)   SpO2 98%   BMI 24.96 kg/m  VS noted,  Constitutional: Pt appears in NAD HENT: Head: NCAT.  Right Ear: External ear normal.  Left Ear: External ear normal.  Eyes: . Pupils are equal, round, and reactive to light. Conjunctivae and EOM are normal Nose: without d/c or deformity Neck: Neck supple. Gross normal ROM Cardiovascular: Normal rate and regular rhythm.   Pulmonary/Chest: Effort normal and breath sounds without rales or wheezing.  Abd:  Soft, NT, ND, + BS, no organomegaly Neurological: Pt is alert. At baseline orientation, motor grossly intact Skin: Skin is warm. +  numerous erythem itchy red lesions rash to extremties and right chest, other new lesions, trace bilat ankle LE edema Psychiatric: Pt behavior is normal without agitation  No other exam findings    Assessment & Plan:

## 2017-10-10 NOTE — Patient Instructions (Addendum)
You had the steroid shot today  Please take all new medication as prescribed - the prednisone  OK to use the Benadryl topical cream for itching as well as Claritin 10 mg per day for a few days until better  Please take all new medication as prescribed - the HCT fluid pill at 12.5 mg to take only if you have swelling that day  Please continue all other medications as before, and refills have been done if requested.  Please have the pharmacy call with any other refills you may need.  Please keep your appointments with your specialists as you may have planned

## 2017-10-11 ENCOUNTER — Ambulatory Visit: Payer: Medicare HMO | Admitting: Internal Medicine

## 2017-10-15 ENCOUNTER — Ambulatory Visit (INDEPENDENT_AMBULATORY_CARE_PROVIDER_SITE_OTHER): Payer: Medicare HMO | Admitting: *Deleted

## 2017-10-15 DIAGNOSIS — J4550 Severe persistent asthma, uncomplicated: Secondary | ICD-10-CM | POA: Diagnosis not present

## 2017-10-15 MED ORDER — BENRALIZUMAB 30 MG/ML ~~LOC~~ SOSY
30.0000 mg | PREFILLED_SYRINGE | SUBCUTANEOUS | Status: DC
  Administered 2017-10-15 – 2020-01-12 (×14): 30 mg via SUBCUTANEOUS

## 2017-10-24 ENCOUNTER — Encounter: Payer: Self-pay | Admitting: Allergy

## 2017-10-24 ENCOUNTER — Ambulatory Visit: Payer: Medicare HMO | Admitting: Allergy

## 2017-10-24 VITALS — BP 122/78 | HR 76 | Resp 16

## 2017-10-24 DIAGNOSIS — J3089 Other allergic rhinitis: Secondary | ICD-10-CM

## 2017-10-24 DIAGNOSIS — J4550 Severe persistent asthma, uncomplicated: Secondary | ICD-10-CM | POA: Diagnosis not present

## 2017-10-24 DIAGNOSIS — D8989 Other specified disorders involving the immune mechanism, not elsewhere classified: Secondary | ICD-10-CM | POA: Diagnosis not present

## 2017-10-24 DIAGNOSIS — J339 Nasal polyp, unspecified: Secondary | ICD-10-CM

## 2017-10-24 DIAGNOSIS — L509 Urticaria, unspecified: Secondary | ICD-10-CM

## 2017-10-24 DIAGNOSIS — J324 Chronic pansinusitis: Secondary | ICD-10-CM | POA: Diagnosis not present

## 2017-10-24 DIAGNOSIS — K219 Gastro-esophageal reflux disease without esophagitis: Secondary | ICD-10-CM

## 2017-10-24 NOTE — Patient Instructions (Addendum)
  1. Symbicort 160 - 2 inhalations twice a day   2. Spiriva 1.25 Respimat - 2 inhalations 1 time per day  3.  Flonase -1 spray each nostril twice a day  4. Montelukast 10mg  -1 tablet 1 time per day  5.  Omeprazole 40mg  1 tablet 1 time per day  6. Fasenra every 8 weeks  7.  If needed:   A.  Ventolin HFA 2 puffs or albuterol nebulization every 4-6 hours  B.  OTC antihistamine and OTC Mucinex DM  C. Nasal saline spray  D. Ipratropium 0.06% 2 sprays each nostril 3 times per day  8.  For hives/itching: at this time etiology of hives is unknown.  Hives can be caused by a variety of different triggers including illness/infection, foods, medications, stings, exercise, pressure, vibrations, extremes of temperature to name a few however majority of the time there is no identifiable trigger.  Will obtain tryptase and alpha-gal panel at this time.    - start Claritin 10mg  twice a day   - start Zantac 150mg  twice a day   - continue singulair as above  9. Return to clinic in 12 weeks or earlier if problem

## 2017-10-24 NOTE — Progress Notes (Signed)
Follow-up Note  RE: Angelica [name] MRN: [id number] DOB: February 12, 1941 Date of Office Visit: 10/24/2017   History of present illness: Angelica [name] is a 77 y.o. female presenting today for itching and hives that started several days ago.  She was last seen in the office on Sep 24, 2017 by Dr. Neldon Mc for severe persistent asthma, nasal polyposis, allergic rhinitis, chronic pansinusitis, LPRD and kappa light chain disease. She states she was doing well until about the end of May early June when she started to develop hives.  She states the hives did not start immediately after her last Fasenra dose on 5/28 but started within days after this injection.  She has had 3 previous injections without any problems.  The hives come and go and have been appearing on her arms and legs primarily.  They are very itchy.  The hives seem to be worse at night.  She states she is scratching in her sleep.  She did see her PCP last week who prescribed her a 5-day course of prednisone and prior to that gave her a steroid injection.  She states this has helped tremendously however she has completed the steroids and the rash has come back.  The hives are not leaving any marks or bruising behind, no associated fevers, no associated swelling and no worsening of any joint aches or pains.  She denies any change in medications or new medications, no new foods or any food triggers, no stings or bites, no change in body products or detergents.  She states she has not had any preceding illnesses.  She is taking Singulair daily for her asthma.  She states she has Claritin at home that she will normally take as needed.  She states that her asthma has been doing very well being on Fasenra as well as Symbicort and Spiriva.  For her polyposis and allergic rhinitis she is on Flonase.  Review of systems: Review of Systems  Constitutional: Negative for chills, fever and malaise/fatigue.  HENT: Negative for congestion, ear discharge, ear  pain, nosebleeds and sore throat.   Eyes: Negative for pain, discharge and redness.  Respiratory: Negative for cough, shortness of breath and wheezing.   Cardiovascular: Negative for chest pain.  Gastrointestinal: Negative for abdominal pain, constipation, diarrhea, heartburn, nausea and vomiting.  Musculoskeletal: Negative for joint pain.  Skin: Positive for itching and rash.  Neurological: Negative for headaches.    All other systems negative unless noted above in HPI  Past medical/social/surgical/family history have been reviewed and are unchanged unless specifically indicated below.  No changes  Medication List: Allergies as of 10/24/2017      Reactions   Asa Buff (mag [buffered Aspirin] Other (See Comments)   Excessive sweating   Fish Allergy Other (See Comments)   Unknown reaction   Other Other (See Comments)   Gelcaps; gel-containing capsules; extended release tablets   Peanut-containing Drug Products Other (See Comments)   From allergy test   Penicillins Itching   Has patient had a PCN reaction causing immediate rash, facial/tongue/throat swelling, SOB or lightheadedness with hypotension: No Has patient had a PCN reaction causing severe rash involving mucus membranes or skin necrosis: No Has patient had a PCN reaction that required hospitalization No Has patient had a PCN reaction occurring within the last 10 years: No If all of the above answers are "NO", then may proceed with Cephalosporin use.   Shellfish-derived Products Other (See Comments)   From allergy test   Singulair [montelukast  Sodium] Hives   Strawberry Extract Other (See Comments)   Worsened allergy symptoms      Medication List        Accurate as of 10/24/17  7:24 PM. Always use your most recent med list.          acetaminophen 500 MG tablet Commonly known as:  TYLENOL Take 500 mg by mouth daily as needed for mild pain, moderate pain, fever or headache. For pain   albuterol (2.5 MG/3ML) 0.083%  nebulizer solution Commonly known as:  PROVENTIL Take 3 mLs (2.5 mg total) by nebulization 2 (two) times daily as needed for wheezing or shortness of breath.   albuterol 108 (90 Base) MCG/ACT inhaler Commonly known as:  VENTOLIN HFA Inhale 2 puffs into the lungs every 4 (four) hours as needed for wheezing or shortness of breath.   budesonide-formoterol 160-4.5 MCG/ACT inhaler Commonly known as:  SYMBICORT Inhale 2 puffs into the lungs 2 (two) times daily.   cholecalciferol 1000 units tablet Commonly known as:  VITAMIN D Take 2,000 Units by mouth daily.   EPINEPHrine 0.3 mg/0.3 mL Soaj injection Commonly known as:  EPIPEN 2-PAK Use as directed for severe allergic reaction   hydrochlorothiazide 12.5 MG capsule Commonly known as:  MICROZIDE Take 1 capsule (12.5 mg total) by mouth daily. As needed only for swelling   ipratropium 0.06 % nasal spray Commonly known as:  ATROVENT Place 1 spray into both nostrils 3 (three) times daily.   losartan 100 MG tablet Commonly known as:  COZAAR Take 1 tablet (100 mg total) by mouth daily.   omeprazole 40 MG capsule Commonly known as:  PRILOSEC Take 40 mg by mouth daily. For acid reflux   polyethylene glycol powder powder Commonly known as:  GLYCOLAX/MIRALAX Take 17 g by mouth daily.   predniSONE 10 MG tablet Commonly known as:  DELTASONE 2 tabs by mouth per day for 5 days   Tiotropium Bromide Monohydrate 1.25 MCG/ACT Aers Commonly known as:  SPIRIVA RESPIMAT Inhale 2 puffs into the lungs daily.   vitamin B-12 1000 MCG tablet Commonly known as:  CYANOCOBALAMIN Take 1,000 mcg by mouth daily.       Known medication allergies: Allergies  Allergen Reactions  . Asa Buff (Mag [Buffered Aspirin] Other (See Comments)    Excessive sweating  . Fish Allergy Other (See Comments)    Unknown reaction  . Other Other (See Comments)    Gelcaps; gel-containing capsules; extended release tablets  . Peanut-Containing Drug Products Other  (See Comments)    From allergy test  . Penicillins Itching    Has patient had a PCN reaction causing immediate rash, facial/tongue/throat swelling, SOB or lightheadedness with hypotension: No Has patient had a PCN reaction causing severe rash involving mucus membranes or skin necrosis: No Has patient had a PCN reaction that required hospitalization No Has patient had a PCN reaction occurring within the last 10 years: No If all of the above answers are "NO", then may proceed with Cephalosporin use.  Marland Kitchen Shellfish-Derived Products Other (See Comments)    From allergy test  . Singulair [Montelukast Sodium] Hives  . Strawberry Extract Other (See Comments)    Worsened allergy symptoms     Physical examination: Blood pressure 122/78, pulse 76, resp. rate 16.  General: Alert, interactive, in no acute distress. HEENT: PERRLA, TMs pearly gray, turbinates mildly edematous without discharge, post-pharynx non erythematous. Neck: Supple without lymphadenopathy. Lungs: Clear to auscultation without wheezing, rhonchi or rales. {no increased work of breathing. CV:  Normal S1, S2 without murmurs. Abdomen: Nondistended, nontender. Skin: Scattered erythematous urticarial type lesions primarily located right forearm and right thigh , nonvesicular. Extremities:  No clubbing, cyanosis or edema. Neuro:   Grossly intact.  Diagnositics/Labs:  Spirometry: FEV1: 2.06L 124%, FVC: 2.44L 113%, ratio consistent with nonobstructive pattern  Assessment and plan:    Urticaria, acute    Asthma, severe persistent  Nasal polyposis  Allergic rhinitis  LPRD  Kappa light chain disease  1. Symbicort 160 - 2 inhalations twice a day   2. Spiriva 1.25 Respimat - 2 inhalations 1 time per day  3.  Flonase -1 spray each nostril twice a day  4. Montelukast 10mg  -1 tablet 1 time per day  5.  Omeprazole 40mg  1 tablet 1 time per day  6. Fasenra every 8 weeks  7.  If needed:   A.  Ventolin HFA 2 puffs or albuterol  nebulization every 4-6 hours  B.  OTC antihistamine and OTC Mucinex DM  C. Nasal saline spray  D. Ipratropium 0.06% 2 sprays each nostril 3 times per day  8.  For hives/itching: at this time etiology of hives is unknown. I do not think the hives are associated with her Fasenra injections. I have advised that she continue current dosing of Fasenra at this time. Hives can be caused by a variety of different triggers including illness/infection, foods, medications, stings, exercise, pressure, vibrations, extremes of temperature to name a few however majority of the time there is no identifiable trigger.  With acute hives will obtain tryptase and alpha-gal panel at this time as he does consume red meat products.    - start Claritin 10mg  twice a day   - start Zantac 150mg  twice a day   - continue singulair as above  9. Return to clinic in 12 weeks or earlier if problem  I appreciate the opportunity to take part in Angelica [name]'s care. Please do not hesitate to contact me with questions.  Sincerely,   Prudy Feeler, MD Allergy/Immunology [facility]

## 2017-10-28 ENCOUNTER — Other Ambulatory Visit: Payer: Self-pay

## 2017-10-28 LAB — ALPHA-GAL PANEL

## 2017-10-28 LAB — TRYPTASE

## 2017-10-28 MED ORDER — LOSARTAN POTASSIUM 100 MG PO TABS: 100.0000 mg | ORAL_TABLET | Freq: Every day | ORAL | 1 refills | Status: DC

## 2017-10-28 NOTE — Addendum Note (Signed)
Addended by: Herbie Drape on: 10/28/2017 08:55 AM   Modules accepted: Orders

## 2017-10-29 DIAGNOSIS — L509 Urticaria, unspecified: Secondary | ICD-10-CM | POA: Diagnosis not present

## 2017-10-29 NOTE — Addendum Note (Signed)
Addended by: Orlene Erm on: 10/29/2017 10:04 AM   Modules accepted: Orders

## 2017-11-02 LAB — ALPHA-GAL PANEL
Alpha Gal IgE*: <0.1 kU/L (ref <0.10)
Beef (Bos spp) IgE: <0.1 kU/L (ref <0.35)
Class Interpretation: 0
Class Interpretation: 0
Class Interpretation: 0
Lamb/Mutton (Ovis spp) IgE: <0.1 kU/L (ref <0.35)
Pork (Sus spp) IgE: <0.1 kU/L (ref <0.35)

## 2017-11-02 LAB — TRYPTASE: Tryptase: 6.2 ug/L (ref 2.2–13.2)

## 2017-11-04 ENCOUNTER — Other Ambulatory Visit: Payer: Self-pay | Admitting: Allergy and Immunology

## 2017-11-04 DIAGNOSIS — J3089 Other allergic rhinitis: Secondary | ICD-10-CM

## 2017-11-08 ENCOUNTER — Encounter: Payer: Self-pay | Admitting: Internal Medicine

## 2017-11-08 ENCOUNTER — Other Ambulatory Visit (INDEPENDENT_AMBULATORY_CARE_PROVIDER_SITE_OTHER): Payer: Medicare HMO

## 2017-11-08 ENCOUNTER — Ambulatory Visit (INDEPENDENT_AMBULATORY_CARE_PROVIDER_SITE_OTHER): Payer: Medicare HMO | Admitting: Internal Medicine

## 2017-11-08 DIAGNOSIS — Z0000 Encounter for general adult medical examination without abnormal findings: Secondary | ICD-10-CM

## 2017-11-08 DIAGNOSIS — Z23 Encounter for immunization: Secondary | ICD-10-CM | POA: Diagnosis not present

## 2017-11-08 DIAGNOSIS — I10 Essential (primary) hypertension: Secondary | ICD-10-CM

## 2017-11-08 DIAGNOSIS — R21 Rash and other nonspecific skin eruption: Secondary | ICD-10-CM | POA: Diagnosis not present

## 2017-11-08 DIAGNOSIS — K5900 Constipation, unspecified: Secondary | ICD-10-CM

## 2017-11-08 LAB — URINALYSIS, ROUTINE W REFLEX MICROSCOPIC
Bilirubin Urine: NEGATIVE
Hgb urine dipstick: NEGATIVE
Ketones, ur: NEGATIVE
Leukocytes, UA: NEGATIVE
Nitrite: NEGATIVE
RBC / HPF: NONE SEEN (ref 0–?)
Specific Gravity, Urine: <=1.005 — AB (ref 1.000–1.030)
Total Protein, Urine: NEGATIVE
Urine Glucose: NEGATIVE
Urobilinogen, UA: 0.2 (ref 0.0–1.0)
pH: 6 (ref 5.0–8.0)

## 2017-11-08 LAB — BASIC METABOLIC PANEL
BUN: 15 mg/dL (ref 6–23)
CO2: 28 mEq/L (ref 19–32)
Calcium: 9.6 mg/dL (ref 8.4–10.5)
Chloride: 105 mEq/L (ref 96–112)
Creatinine, Ser: 0.89 mg/dL (ref 0.40–1.20)
GFR: 78.91 mL/min (ref >60.00)
Glucose, Bld: 102 mg/dL — ABNORMAL HIGH (ref 70–99)
Potassium: 4.1 mEq/L (ref 3.5–5.1)
Sodium: 140 mEq/L (ref 135–145)

## 2017-11-08 LAB — CBC WITH DIFFERENTIAL/PLATELET
Basophils Absolute: 0.1 10*3/uL (ref 0.0–0.1)
Basophils Relative: 0.8 % (ref 0.0–3.0)
Eosinophils Absolute: 0 10*3/uL (ref 0.0–0.7)
Eosinophils Relative: 0 % (ref 0.0–5.0)
HCT: 40.2 % (ref 36.0–46.0)
Hemoglobin: 13.6 g/dL (ref 12.0–15.0)
Lymphocytes Relative: 39.3 % (ref 12.0–46.0)
Lymphs Abs: 2.8 10*3/uL (ref 0.7–4.0)
MCHC: 33.7 g/dL (ref 30.0–36.0)
MCV: 91.5 fl (ref 78.0–100.0)
Monocytes Absolute: 0.5 10*3/uL (ref 0.1–1.0)
Monocytes Relative: 7.2 % (ref 3.0–12.0)
Neutro Abs: 3.7 10*3/uL (ref 1.4–7.7)
Neutrophils Relative %: 52.7 % (ref 43.0–77.0)
Platelets: 264 10*3/uL (ref 150.0–400.0)
RBC: 4.39 Mil/uL (ref 3.87–5.11)
RDW: 14.1 % (ref 11.5–15.5)
WBC: 7.1 10*3/uL (ref 4.0–10.5)

## 2017-11-08 LAB — LIPID PANEL
Cholesterol: 194 mg/dL (ref 0–200)
HDL: 75.9 mg/dL (ref >39.00)
LDL Cholesterol: 106 mg/dL — ABNORMAL HIGH (ref 0–99)
NonHDL: 118.02
Total CHOL/HDL Ratio: 3
Triglycerides: 59 mg/dL (ref 0.0–149.0)
VLDL: 11.8 mg/dL (ref 0.0–40.0)

## 2017-11-08 LAB — HEPATIC FUNCTION PANEL
ALT: 11 U/L (ref 0–35)
AST: 17 U/L (ref 0–37)
Albumin: 4.1 g/dL (ref 3.5–5.2)
Alkaline Phosphatase: 68 U/L (ref 39–117)
Bilirubin, Direct: 0.2 mg/dL (ref 0.0–0.3)
Total Bilirubin: 0.5 mg/dL (ref 0.2–1.2)
Total Protein: 7.6 g/dL (ref 6.0–8.3)

## 2017-11-08 LAB — TSH: TSH: 2.02 u[IU]/mL (ref 0.35–4.50)

## 2017-11-08 MED ORDER — METHYLPREDNISOLONE ACETATE 80 MG/ML IJ SUSP
80.0000 mg | Freq: Once | INTRAMUSCULAR | Status: AC
  Administered 2017-11-08: 80 mg via INTRAMUSCULAR

## 2017-11-08 NOTE — Assessment & Plan Note (Signed)
Improved,  to f/u any worsening symptoms or concerns, cont miralax

## 2017-11-08 NOTE — Assessment & Plan Note (Signed)
[place] for depomedrolIM 80, x 1

## 2017-11-08 NOTE — Addendum Note (Signed)
Addended by: Juliet Rude on: 11/08/2017 09:46 AM   Modules accepted: Orders

## 2017-11-08 NOTE — Progress Notes (Signed)
Subjective:    Patient ID: Angelica [name], female    DOB: October 31, 1940, 76 y.o.   MRN: [id number]  HPI Here for wellness and f/u;  Overall doing ok;  Pt denies Chest pain, worsening SOB, DOE, wheezing, orthopnea, PND, worsening LE edema, palpitations, dizziness or syncope.  Pt denies neurological change such as new headache, facial or extremity weakness.  Pt denies polydipsia, polyuria, or low sugar symptoms. Pt states overall good compliance with treatment and medications, good tolerability, and has been trying to follow appropriate diet.  Pt denies worsening depressive symptoms, suicidal ideation or panic. No fever, night sweats, wt loss, loss of appetite, or other constitutional symptoms.  Pt states good ability with ADL's, has low fall risk, home safety reviewed and adequate, no other significant changes in hearing or vision, and only occasionally active with exercise. Having some kind of allergy rash ongoing chronic for several months, ? Chocolate and other, seeing allergist last seen last wk with testing but she is not sure of results.  Constipation was signficant mar 2019 but now improved.  No other interval hx or new complaints Past Medical History:  Diagnosis Date  . Asthma   . Bowel obstruction ([place])   . Environmental allergies   . GERD (gastroesophageal reflux disease)   . Glaucoma 05/09/2017  . H. pylori infection   . Hypertension   . Osteopenia 05/26/2017   Past Surgical History:  Procedure Laterality Date  . ABDOMINAL HYSTERECTOMY    . BREAST BIOPSY    . COLON SURGERY    . KNEE SURGERY    . LAPAROTOMY N/A 04/18/2015   Procedure: Exploratory laparotomy with lysis of adhesions, possible bowel resection;  Surgeon: Ralene Ok, MD;  Location: [facility];  Service: General;  Laterality: N/A;  . NASAL SINUS SURGERY    . THYROID SURGERY      reports that she has quit smoking. Her smoking use included cigarettes. She has never used smokeless tobacco. She reports that she does not  drink alcohol or use drugs. family history is not on file. Allergies  Allergen Reactions  . Asa Buff (Mag [Buffered Aspirin] Other (See Comments)    Excessive sweating  . Fish Allergy Other (See Comments)    Unknown reaction  . Other Other (See Comments)    Gelcaps; gel-containing capsules; extended release tablets  . Peanut-Containing Drug Products Other (See Comments)    From allergy test  . Penicillins Itching    Has patient had a PCN reaction causing immediate rash, facial/tongue/throat swelling, SOB or lightheadedness with hypotension: No Has patient had a PCN reaction causing severe rash involving mucus membranes or skin necrosis: No Has patient had a PCN reaction that required hospitalization No Has patient had a PCN reaction occurring within the last 10 years: No If all of the above answers are "NO", then may proceed with Cephalosporin use.  Marland Kitchen Shellfish-Derived Products Other (See Comments)    From allergy test  . Singulair [Montelukast Sodium] Hives  . Strawberry Extract Other (See Comments)    Worsened allergy symptoms   Current Outpatient Medications on File Prior to Visit  Medication Sig Dispense Refill  . acetaminophen (TYLENOL) 500 MG tablet Take 500 mg by mouth daily as needed for mild pain, moderate pain, fever or headache. For pain    . albuterol (PROVENTIL) (2.5 MG/3ML) 0.083% nebulizer solution Take 3 mLs (2.5 mg total) by nebulization 2 (two) times daily as needed for wheezing or shortness of breath. 75 mL 0  . albuterol (  VENTOLIN HFA) 108 (90 Base) MCG/ACT inhaler Inhale 2 puffs into the lungs every 4 (four) hours as needed for wheezing or shortness of breath. 1 Inhaler 1  . budesonide-formoterol (SYMBICORT) 160-4.5 MCG/ACT inhaler Inhale 2 puffs into the lungs 2 (two) times daily.    . cholecalciferol (VITAMIN D) 1000 units tablet Take 2,000 Units by mouth daily.    Marland Kitchen EPINEPHrine (EPIPEN 2-PAK) 0.3 mg/0.3 mL IJ SOAJ injection Use as directed for severe allergic  reaction 2 Device 1  . hydrochlorothiazide (MICROZIDE) 12.5 MG capsule Take 1 capsule (12.5 mg total) by mouth daily. As needed only for swelling 90 capsule 3  . ipratropium (ATROVENT) 0.06 % nasal spray Place 1 spray into both nostrils 3 (three) times daily.    Marland Kitchen ipratropium (ATROVENT) 0.06 % nasal spray USE 1 SPRAY(S) IN EACH NOSTRIL THREE TIMES DAILY 15 mL 5  . losartan (COZAAR) 100 MG tablet Take 1 tablet (100 mg total) by mouth daily. 90 tablet 1  . omeprazole (PRILOSEC) 40 MG capsule Take 40 mg by mouth daily. For acid reflux    . polyethylene glycol powder (GLYCOLAX/MIRALAX) powder Take 17 g by mouth daily.    . predniSONE (DELTASONE) 10 MG tablet 2 tabs by mouth per day for 5 days 10 tablet 0  . Tiotropium Bromide Monohydrate (SPIRIVA RESPIMAT) 1.25 MCG/ACT AERS Inhale 2 puffs into the lungs daily. 4 g 5  . vitamin B-12 (CYANOCOBALAMIN) 1000 MCG tablet Take 1,000 mcg by mouth daily.     Current Facility-Administered Medications on File Prior to Visit  Medication Dose Route Frequency Provider Last Rate Last Dose  . Benralizumab SOSY 30 mg  30 mg Subcutaneous Q8 Weeks Kozlow, Donnamarie Poag, MD   30 mg at 10/15/17 1853   Review of Systems Constitutional: Negative for other unusual diaphoresis, sweats, appetite or weight changes HENT: Negative for other worsening hearing loss, ear pain, facial swelling, mouth sores or neck stiffness.   Eyes: Negative for other worsening pain, redness or other visual disturbance.  Respiratory: Negative for other stridor or swelling Cardiovascular: Negative for other palpitations or other chest pain  Gastrointestinal: Negative for worsening diarrhea or loose stools, blood in stool, distention or other pain Genitourinary: Negative for hematuria, flank pain or other change in urine volume.  Musculoskeletal: Negative for myalgias or other joint swelling.  Skin: Negative for other color change, or other wound or worsening drainage.  Neurological: Negative for other  syncope or numbness. Hematological: Negative for other adenopathy or swelling Psychiatric/Behavioral: Negative for hallucinations, other worsening agitation, SI, self-injury, or new decreased concentration All other system neg per pt    Objective:   Physical Exam BP 122/76   Pulse 79   Temp 98.4 F (36.9 C) (Oral)   Ht 5\' 5"  (1.651 m)   Wt 147 lb (66.7 kg)   SpO2 98%   BMI 24.46 kg/m  VS noted,  Constitutional: Pt is oriented to person, place, and time. Appears well-developed and well-nourished, in no significant distress and comfortable Head: Normocephalic and atraumatic  Eyes: Conjunctivae and EOM are normal. Pupils are equal, round, and reactive to light Right Ear: External ear normal without discharge Left Ear: External ear normal without discharge Nose: Nose without discharge or deformity Mouth/Throat: Oropharynx is without other ulcerations and moist  Neck: Normal range of motion. Neck supple. No JVD present. No tracheal deviation present or significant neck LA or mass Cardiovascular: Normal rate, regular rhythm, normal heart sounds and intact distal pulses.  Pulmonary/Chest: WOB normal and breath  sounds without rales or wheezing  Abdominal: Soft. Bowel sounds are normal. NT. No HSM  Musculoskeletal: Normal range of motion. Exhibits no edema Lymphadenopathy: Has no other cervical adenopathy.  Neurological: Pt is alert and oriented to person, place, and time. Pt has normal reflexes. No cranial nerve deficit. Motor grossly intact, Gait intact Skin: Skin is warm and dry. + areas of nontender erythema to extrmeities slight raised rash noted, no new ulcerations Psychiatric:  Has normal mood and affect. Behavior is normal without agitation No other exam findings  Lab Results  Component Value Date   WBC 5.9 08/15/2017   HGB 11.9 (L) 08/15/2017   HCT 35.1 (L) 08/15/2017   PLT 228 08/15/2017   GLUCOSE 98 08/15/2017   CHOL 159 05/09/2017   TRIG 54.0 05/09/2017   HDL 66.80  05/09/2017   LDLCALC 82 05/09/2017   ALT 12 (L) 07/06/2017   AST 21 07/06/2017   NA 136 08/15/2017   K 3.5 08/15/2017   CL 105 08/15/2017   CREATININE 0.86 08/15/2017   BUN 13 08/15/2017   CO2 23 08/15/2017   TSH 1.15 05/09/2017       Assessment & Plan:

## 2017-11-08 NOTE — Assessment & Plan Note (Signed)

## 2017-11-08 NOTE — Patient Instructions (Addendum)
You had the Prevnar 13 pneumonia shot  You had the steroid shot today  Please continue all other medications as before, and refills have been done if requested.  Please have the pharmacy call with any other refills you may need.  Please continue your efforts at being more active, low cholesterol diet, and weight control.  You are otherwise up to date with prevention measures today.  Please keep your appointments with your specialists as you may have planned  Please go to the LAB in the Basement (turn left off the elevator) for the tests to be done today  You will be contacted by phone if any changes need to be made immediately.  Otherwise, you will receive a letter about your results with an explanation, but please check with MyChart first.  Please remember to sign up for MyChart if you have not done so, as this will be important to you in the future with finding out test results, communicating by private email, and scheduling acute appointments online when needed.  Please return in 6 months, or sooner if needed

## 2017-11-08 NOTE — Assessment & Plan Note (Signed)
stable overall by history and exam, recent data reviewed with pt, and pt to continue medical treatment as before,  to f/u any worsening symptoms or concerns BP Readings from Last 3 Encounters:  11/08/17 122/76  10/24/17 122/78  10/10/17 122/76

## 2017-11-13 ENCOUNTER — Other Ambulatory Visit: Payer: Self-pay | Admitting: Allergy and Immunology

## 2017-11-13 DIAGNOSIS — J4550 Severe persistent asthma, uncomplicated: Secondary | ICD-10-CM

## 2017-11-27 ENCOUNTER — Telehealth: Payer: Self-pay | Admitting: Allergy and Immunology

## 2017-11-27 NOTE — Telephone Encounter (Signed)
Patient is requesting a sample of Spiriva.

## 2017-11-27 NOTE — Telephone Encounter (Signed)
Called and spoke to patient and informed her that we do not have any samples, patient asked when we get one could we set one aside for her and call and let her know. I informed her that we would.

## 2017-11-28 ENCOUNTER — Other Ambulatory Visit: Payer: Self-pay | Admitting: *Deleted

## 2017-11-29 MED ORDER — OMEPRAZOLE 40 MG PO CPDR: 40.0000 mg | DELAYED_RELEASE_CAPSULE | Freq: Every day | ORAL | 3 refills | Status: DC

## 2017-12-02 NOTE — Telephone Encounter (Signed)
Called and informed patient that we have a spiriva sample up front for pick up. Patient very thankful.

## 2017-12-05 ENCOUNTER — Other Ambulatory Visit: Payer: Self-pay

## 2017-12-09 ENCOUNTER — Other Ambulatory Visit: Payer: Self-pay | Admitting: *Deleted

## 2017-12-09 MED ORDER — MONTELUKAST SODIUM 10 MG PO TABS: 10.0000 mg | ORAL_TABLET | Freq: Every day | ORAL | 3 refills | Status: DC

## 2017-12-10 ENCOUNTER — Ambulatory Visit (INDEPENDENT_AMBULATORY_CARE_PROVIDER_SITE_OTHER): Payer: Medicare HMO | Admitting: *Deleted

## 2017-12-10 DIAGNOSIS — J4550 Severe persistent asthma, uncomplicated: Secondary | ICD-10-CM | POA: Diagnosis not present

## 2018-01-14 ENCOUNTER — Other Ambulatory Visit: Payer: Self-pay

## 2018-01-14 DIAGNOSIS — J3089 Other allergic rhinitis: Secondary | ICD-10-CM

## 2018-01-14 MED ORDER — IPRATROPIUM BROMIDE 0.06 % NA SOLN: 1.0000 | Freq: Three times a day (TID) | NASAL | 2 refills | Status: DC

## 2018-01-21 ENCOUNTER — Telehealth: Payer: Self-pay | Admitting: Internal Medicine

## 2018-01-21 NOTE — Telephone Encounter (Signed)
Pt daughter dropped off a form for the patient a request for statement of physcian  Form from [place] in box for signature, please call patiet once this is ready and daughter will come pick up.

## 2018-01-21 NOTE — Telephone Encounter (Signed)
Form has been placed on PCP desk.

## 2018-01-24 ENCOUNTER — Ambulatory Visit (INDEPENDENT_AMBULATORY_CARE_PROVIDER_SITE_OTHER): Payer: Medicare HMO | Admitting: Allergy

## 2018-01-24 ENCOUNTER — Encounter: Payer: Self-pay | Admitting: Allergy

## 2018-01-24 VITALS — BP 112/62 | HR 83 | Resp 16 | Ht 65.0 in | Wt 158.2 lb

## 2018-01-24 DIAGNOSIS — J3089 Other allergic rhinitis: Secondary | ICD-10-CM

## 2018-01-24 DIAGNOSIS — J339 Nasal polyp, unspecified: Secondary | ICD-10-CM

## 2018-01-24 DIAGNOSIS — L509 Urticaria, unspecified: Secondary | ICD-10-CM

## 2018-01-24 DIAGNOSIS — D8989 Other specified disorders involving the immune mechanism, not elsewhere classified: Secondary | ICD-10-CM

## 2018-01-24 DIAGNOSIS — J4550 Severe persistent asthma, uncomplicated: Secondary | ICD-10-CM | POA: Diagnosis not present

## 2018-01-24 DIAGNOSIS — K219 Gastro-esophageal reflux disease without esophagitis: Secondary | ICD-10-CM

## 2018-01-24 NOTE — Progress Notes (Signed)
Follow-up Note  RE: Angelica [name] MRN: [id number] DOB: September 13, 1940 Date of Office Visit: 01/24/2018   History of present illness: Angelica [name] is a 77 y.o. female presenting today for follow-up of asthma, nasal polyposis, allergic rhinitis, LPRD and urticaria.  She also has history of kappa light chain disease.  She was last seen the office on 10/24/17 by myself.  At that visit she was complaining of hives.  She states after the visit the hives went away and she hasn't had any more since.  She does take claritin daily and singulair for allergic rhinitis which has been controlled.  She uses flonase mostly as night as states does help decrease nasal drainage.  She also uses simply saline spray for her nose to help with moisturization.  With her asthma she states she is doing well.  She is now on Fasenra every 8 week injections.  She continues on symbicort 124mcg 2 puffs twice a day and spiriva 2 puffs once a day.  She denies needing albuterol and has not required ED/UC visits or hospitalization since last visit.  She continues on omeprazole daily for control of reflux.   Review of systems: Review of Systems  Constitutional: Negative for chills, fever and malaise/fatigue.  HENT: Positive for congestion. Negative for ear discharge, ear pain, nosebleeds and sore throat.   Eyes: Negative for pain, discharge and redness.  Respiratory: Negative for cough, shortness of breath and wheezing.   Cardiovascular: Negative for chest pain.  Gastrointestinal: Negative for abdominal pain, constipation, diarrhea, heartburn, nausea and vomiting.  Musculoskeletal: Negative for joint pain.  Skin: Negative for itching and rash.  Neurological: Negative for headaches.    All other systems negative unless noted above in HPI  Past medical/social/surgical/family history have been reviewed and are unchanged unless specifically indicated below.  No changes  Medication List: Allergies as of 01/24/2018    Reactions   Asa Buff (mag [buffered Aspirin] Other (See Comments)   Excessive sweating   Fish Allergy Other (See Comments)   Unknown reaction   Other Other (See Comments)   Gelcaps; gel-containing capsules; extended release tablets   Peanut-containing Drug Products Other (See Comments)   From allergy test   Penicillins Itching   Has patient had a PCN reaction causing immediate rash, facial/tongue/throat swelling, SOB or lightheadedness with hypotension: No Has patient had a PCN reaction causing severe rash involving mucus membranes or skin necrosis: No Has patient had a PCN reaction that required hospitalization No Has patient had a PCN reaction occurring within the last 10 years: No If all of the above answers are "NO", then may proceed with Cephalosporin use.   Shellfish-derived Products Other (See Comments)   From allergy test   Singulair [montelukast Sodium] Hives   Strawberry Extract Other (See Comments)   Worsened allergy symptoms      Medication List        Accurate as of 01/24/18 11:14 AM. Always use your most recent med list.          acetaminophen 500 MG tablet Commonly known as:  TYLENOL Take 500 mg by mouth daily as needed for mild pain, moderate pain, fever or headache. For pain   albuterol (2.5 MG/3ML) 0.083% nebulizer solution Commonly known as:  PROVENTIL Take 3 mLs (2.5 mg total) by nebulization 2 (two) times daily as needed for wheezing or shortness of breath.   albuterol 108 (90 Base) MCG/ACT inhaler Commonly known as:  PROVENTIL HFA;VENTOLIN HFA Inhale 2 puffs into the  lungs every 4 (four) hours as needed for wheezing or shortness of breath.   budesonide-formoterol 160-4.5 MCG/ACT inhaler Commonly known as:  SYMBICORT Inhale 2 puffs into the lungs 2 (two) times daily.   cholecalciferol 1000 units tablet Commonly known as:  VITAMIN D Take 2,000 Units by mouth daily.   EPINEPHrine 0.3 mg/0.3 mL Soaj injection Commonly known as:  EPI-PEN Use as  directed for severe allergic reaction   hydrochlorothiazide 12.5 MG capsule Commonly known as:  MICROZIDE Take 1 capsule (12.5 mg total) by mouth daily. As needed only for swelling   ipratropium 0.06 % nasal spray Commonly known as:  ATROVENT USE 1 SPRAY(S) IN EACH NOSTRIL THREE TIMES DAILY   ipratropium 0.06 % nasal spray Commonly known as:  ATROVENT Place 1 spray into both nostrils 3 (three) times daily.   losartan 100 MG tablet Commonly known as:  COZAAR Take 1 tablet (100 mg total) by mouth daily.   montelukast 10 MG tablet Commonly known as:  SINGULAIR Take 1 tablet (10 mg total) by mouth at bedtime.   omeprazole 40 MG capsule Commonly known as:  PRILOSEC Take 1 capsule (40 mg total) by mouth daily. For acid reflux   polyethylene glycol powder powder Commonly known as:  GLYCOLAX/MIRALAX Take 17 g by mouth daily.   SPIRIVA RESPIMAT 1.25 MCG/ACT Aers Generic drug:  Tiotropium Bromide Monohydrate Inhale 2 puffs into the lungs daily.   vitamin B-12 1000 MCG tablet Commonly known as:  CYANOCOBALAMIN Take 1,000 mcg by mouth daily.       Known medication allergies: Allergies  Allergen Reactions  . Asa Buff (Mag [Buffered Aspirin] Other (See Comments)    Excessive sweating  . Fish Allergy Other (See Comments)    Unknown reaction  . Other Other (See Comments)    Gelcaps; gel-containing capsules; extended release tablets  . Peanut-Containing Drug Products Other (See Comments)    From allergy test  . Penicillins Itching    Has patient had a PCN reaction causing immediate rash, facial/tongue/throat swelling, SOB or lightheadedness with hypotension: No Has patient had a PCN reaction causing severe rash involving mucus membranes or skin necrosis: No Has patient had a PCN reaction that required hospitalization No Has patient had a PCN reaction occurring within the last 10 years: No If all of the above answers are "NO", then may proceed with Cephalosporin use.  Marland Kitchen  Shellfish-Derived Products Other (See Comments)    From allergy test  . Singulair [Montelukast Sodium] Hives  . Strawberry Extract Other (See Comments)    Worsened allergy symptoms     Physical examination: Blood pressure 112/62, pulse 83, resp. rate 16, height 5\' 5"  (1.651 m), weight 158 lb 3.2 oz (71.8 kg), SpO2 99 %.  General: Alert, interactive, in no acute distress. HEENT: PERRLA, TMs pearly gray, turbinates minimally edematous without discharge, post-pharynx non erythematous. Neck: Supple without lymphadenopathy. Lungs: Clear to auscultation without wheezing, rhonchi or rales. {no increased work of breathing. CV: Normal S1, S2 without murmurs. Abdomen: Nondistended, nontender. Skin: Warm and dry, without lesions or rashes. Extremities:  No clubbing, cyanosis or edema. Neuro:   Grossly intact.  Diagnositics/Labs:  Spirometry: FEV1: 1.97L 115%, FVC: 2.46L 111%, ratio consistent with nonobstructive pattern  Assessment and plan:    Urticaria, acute - resolved, no further episodes  Asthma, severe persistent - well controlled on Fasenra and controller medications.  Planning to step-down therapy if she continues under good control  Nasal polyposis - stable  Allergic rhinitis - stable with current  regimen  LPRD - stable with current regimen  Kappa light chain disease  1. Symbicort 160 - 2 inhalations twice a day.  Let us know when you are close to running out of your Symbicort 149mcg and will step down to Symbicort 80 mcg.    2. Spiriva 1.25 Respimat - 2 inhalations 1 time per day  3.  Flonase -1 spray each nostril twice a day  4. Montelukast 10mg  -1 tablet 1 time per day  5.  Omeprazole 40mg  -1 tablet 1 time per day  6. Fasenra every 8 weeks  7.  If needed:   A.  Ventolin HFA 2 puffs or albuterol nebulization every 4-6 hours  B.  OTC antihistamine and OTC Mucinex DM  C. Nasal saline spray  D. Ipratropium 0.06% 2 sprays each nostril 3 times per day  8.  Obtain flu  vaccine from PCP  9. Return to clinic in 6 months or earlier if problem  I appreciate the opportunity to take part in Kinga's care. Please do not hesitate to contact me with questions.  Sincerely,   Prudy Feeler, MD Allergy/Immunology [facility]

## 2018-01-24 NOTE — Patient Instructions (Addendum)
  1. Symbicort 160 - 2 inhalations twice a day.       Let us know when you are close to running out of your Symbicort 194mcg and will step down to Symbicort 80 mcg.    2. Spiriva 1.25 Respimat - 2 inhalations 1 time per day  3.  Flonase -1 spray each nostril twice a day  4. Montelukast 10mg  -1 tablet 1 time per day  5.  Omeprazole 40mg  -1 tablet 1 time per day  6. Fasenra every 8 weeks  7.  If needed:   A.  Ventolin HFA 2 puffs or albuterol nebulization every 4-6 hours  B.  OTC antihistamine and OTC Mucinex DM  C. Nasal saline spray  D. Ipratropium 0.06% 2 sprays each nostril 3 times per day  8.  Obtain flu vaccine from PCP  9. Return to clinic in 6 months or earlier if problem

## 2018-01-30 ENCOUNTER — Encounter: Payer: Self-pay | Admitting: Internal Medicine

## 2018-01-30 ENCOUNTER — Ambulatory Visit (INDEPENDENT_AMBULATORY_CARE_PROVIDER_SITE_OTHER): Payer: Medicare HMO | Admitting: Internal Medicine

## 2018-01-30 VITALS — BP 118/78 | HR 74 | Temp 97.9°F | Ht 65.0 in | Wt 156.0 lb

## 2018-01-30 DIAGNOSIS — K5900 Constipation, unspecified: Secondary | ICD-10-CM

## 2018-01-30 DIAGNOSIS — Z23 Encounter for immunization: Secondary | ICD-10-CM | POA: Diagnosis not present

## 2018-01-30 DIAGNOSIS — I10 Essential (primary) hypertension: Secondary | ICD-10-CM

## 2018-01-30 DIAGNOSIS — J4550 Severe persistent asthma, uncomplicated: Secondary | ICD-10-CM | POA: Diagnosis not present

## 2018-01-30 MED ORDER — LACTULOSE 10 GM/15ML PO SOLN: 30.0000 g | Freq: Two times a day (BID) | ORAL | Status: DC | PRN

## 2018-01-30 NOTE — Patient Instructions (Signed)
Please take all new medication as prescribed - the lactulose  Your form was filled out today  Please continue all other medications as before, and refills have been done if requested.  Please have the pharmacy call with any other refills you may need.  Please continue your efforts at being more active, low cholesterol diet, and weight control.  Please keep your appointments with your specialists as you may have planned

## 2018-01-30 NOTE — Progress Notes (Signed)
Subjective:    Patient ID: Angelica [name], female    DOB: June 30, 1940, 77 y.o.   MRN: [id number]  HPI  Here to f/u; overall doing ok,  Pt denies chest pain, increasing sob or doe, wheezing, orthopnea, PND, increased LE swelling, palpitations, dizziness or syncope.  Pt denies new neurological symptoms such as new headache, or facial or extremity weakness or numbness.  Pt denies polydipsia, polyuria, or low sugar episode.  Pt states overall good compliance with meds, mostly trying to follow appropriate diet, with wt overall stable,  but little exercise however.  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  Has no significant vision, hearing, MSK, neuro issues to preclude.  Asthma has been stable for many years, and she cont's allergy shots weekly which she thinks has really helped  Only c/o of constitpation not always controlled with maralax daily, has hx of abd surgury, but Denies worsening reflux, abd pain, dysphagia, n/v, or blood. Past Medical History:  Diagnosis Date  . Asthma   . Bowel obstruction ([place])   . Environmental allergies   . GERD (gastroesophageal reflux disease)   . Glaucoma 05/09/2017  . H. pylori infection   . Hypertension   . Osteopenia 05/26/2017   Past Surgical History:  Procedure Laterality Date  . ABDOMINAL HYSTERECTOMY    . BREAST BIOPSY    . COLON SURGERY    . KNEE SURGERY    . LAPAROTOMY N/A 04/18/2015   Procedure: Exploratory laparotomy with lysis of adhesions, possible bowel resection;  Surgeon: Ralene Ok, MD;  Location: [facility];  Service: General;  Laterality: N/A;  . NASAL SINUS SURGERY    . THYROID SURGERY      reports that she has quit smoking. Her smoking use included cigarettes. She has never used smokeless tobacco. She reports that she does not drink alcohol or use drugs. family history is not on file. Allergies  Allergen Reactions  . Asa Buff (Mag [Buffered Aspirin]  Other (See Comments)    Excessive sweating  . Fish Allergy Other (See Comments)    Unknown reaction  . Other Other (See Comments)    Gelcaps; gel-containing capsules; extended release tablets  . Peanut-Containing Drug Products Other (See Comments)    From allergy test  . Penicillins Itching    Has patient had a PCN reaction causing immediate rash, facial/tongue/throat swelling, SOB or lightheadedness with hypotension: No Has patient had a PCN reaction causing severe rash involving mucus membranes or skin necrosis: No Has patient had a PCN reaction that required hospitalization No Has patient had a PCN reaction occurring within the last 10 years: No If all of the above answers are "NO", then may proceed with Cephalosporin use.  Marland Kitchen Shellfish-Derived Products Other (See Comments)    From allergy test  . Singulair [Montelukast Sodium] Hives  . Strawberry Extract Other (See Comments)    Worsened allergy symptoms   Current Outpatient Medications on File Prior to Visit  Medication Sig Dispense Refill  . acetaminophen (TYLENOL) 500 MG tablet Take 500 mg by mouth daily as needed for mild pain, moderate pain, fever or headache. For pain    . albuterol (PROVENTIL) (2.5 MG/3ML) 0.083% nebulizer solution Take 3 mLs (2.5 mg total) by nebulization 2 (two) times daily as needed for wheezing or shortness of breath. 75 mL 0  . albuterol (VENTOLIN HFA) 108 (90 Base) MCG/ACT inhaler Inhale 2 puffs into the lungs every 4 (  four) hours as needed for wheezing or shortness of breath. 1 Inhaler 1  . budesonide-formoterol (SYMBICORT) 160-4.5 MCG/ACT inhaler Inhale 2 puffs into the lungs 2 (two) times daily.    . cholecalciferol (VITAMIN D) 1000 units tablet Take 2,000 Units by mouth daily.    Marland Kitchen EPINEPHrine (EPIPEN 2-PAK) 0.3 mg/0.3 mL IJ SOAJ injection Use as directed for severe allergic reaction 2 Device 1  . hydrochlorothiazide (MICROZIDE) 12.5 MG capsule Take 1 capsule (12.5 mg total) by mouth daily. As needed  only for swelling 90 capsule 3  . ipratropium (ATROVENT) 0.06 % nasal spray USE 1 SPRAY(S) IN EACH NOSTRIL THREE TIMES DAILY 15 mL 5  . ipratropium (ATROVENT) 0.06 % nasal spray Place 1 spray into both nostrils 3 (three) times daily. 15 mL 2  . losartan (COZAAR) 100 MG tablet Take 1 tablet (100 mg total) by mouth daily. 90 tablet 1  . montelukast (SINGULAIR) 10 MG tablet Take 1 tablet (10 mg total) by mouth at bedtime. 90 tablet 3  . omeprazole (PRILOSEC) 40 MG capsule Take 1 capsule (40 mg total) by mouth daily. For acid reflux 90 capsule 3  . polyethylene glycol powder (GLYCOLAX/MIRALAX) powder Take 17 g by mouth daily.    . Tiotropium Bromide Monohydrate (SPIRIVA RESPIMAT) 1.25 MCG/ACT AERS Inhale 2 puffs into the lungs daily.     . vitamin B-12 (CYANOCOBALAMIN) 1000 MCG tablet Take 1,000 mcg by mouth daily.     Current Facility-Administered Medications on File Prior to Visit  Medication Dose Route Frequency Provider Last Rate Last Dose  . Benralizumab SOSY 30 mg  30 mg Subcutaneous Q8 Weeks Kozlow, Donnamarie Poag, MD   30 mg at 12/10/17 1914   Review of Systems   Constitutional: Negative for other unusual diaphoresis or sweats HENT: Negative for ear discharge or swelling Eyes: Negative for other worsening visual disturbances Respiratory: Negative for stridor or other swelling  Gastrointestinal: Negative for worsening distension or other blood Genitourinary: Negative for retention or other urinary change Musculoskeletal: Negative for other MSK pain or swelling Skin: Negative for color change or other new lesions Neurological: Negative for worsening tremors and other numbness  Psychiatric/Behavioral: Negative for worsening agitation or other fatigue All other system neg per pt    Objective:   Physical Exam BP 118/78   Pulse 74   Temp 97.9 F (36.6 C) (Oral)   Ht 5\' 5"  (1.651 m)   Wt 156 lb (70.8 kg)   SpO2 98%   BMI 25.96 kg/m  VS noted,  Constitutional: Pt appears in NAD HENT:  Head: NCAT.  Right Ear: External ear normal.  Left Ear: External ear normal.  Eyes: . Pupils are equal, round, and reactive to light. Conjunctivae and EOM are normal Nose: without d/c or deformity Neck: Neck supple. Gross normal ROM Cardiovascular: Normal rate and regular rhythm.   Pulmonary/Chest: Effort normal and breath sounds without rales or wheezing.  Abd:  Soft, NT, ND, + BS, no organomegaly Neurological: Pt is alert. At baseline orientation, motor grossly intact Skin: Skin is warm. No rashes, other new lesions, no LE edema Psychiatric: Pt behavior is normal without agitation  No other exam findings Lab Results  Component Value Date   WBC 7.1 11/08/2017   HGB 13.6 11/08/2017   HCT 40.2 11/08/2017   PLT 264.0 11/08/2017   GLUCOSE 102 (H) 11/08/2017   CHOL 194 11/08/2017   TRIG 59.0 11/08/2017   HDL 75.90 11/08/2017   LDLCALC 106 (H) 11/08/2017   ALT 11 11/08/2017  AST 17 11/08/2017   NA 140 11/08/2017   K 4.1 11/08/2017   CL 105 11/08/2017   CREATININE 0.89 11/08/2017   BUN 15 11/08/2017   CO2 28 11/08/2017   TSH 2.02 11/08/2017       Assessment & Plan:

## 2018-01-30 NOTE — Assessment & Plan Note (Signed)
[place] for lactulose prn,  to f/u any worsening symptoms or concerns

## 2018-02-04 ENCOUNTER — Telehealth: Payer: Self-pay | Admitting: *Deleted

## 2018-02-04 ENCOUNTER — Ambulatory Visit (INDEPENDENT_AMBULATORY_CARE_PROVIDER_SITE_OTHER): Payer: Medicare HMO | Admitting: *Deleted

## 2018-02-04 DIAGNOSIS — J4550 Severe persistent asthma, uncomplicated: Secondary | ICD-10-CM | POA: Diagnosis not present

## 2018-02-04 NOTE — Telephone Encounter (Signed)
Patient came in to get sample of Symbicort 80 reviewed previous note for Dr Nelva Bush and it states to step down

## 2018-02-20 DIAGNOSIS — Z961 Presence of intraocular lens: Secondary | ICD-10-CM | POA: Diagnosis not present

## 2018-02-20 DIAGNOSIS — H35363 Drusen (degenerative) of macula, bilateral: Secondary | ICD-10-CM | POA: Diagnosis not present

## 2018-02-20 DIAGNOSIS — H04123 Dry eye syndrome of bilateral lacrimal glands: Secondary | ICD-10-CM | POA: Diagnosis not present

## 2018-02-20 DIAGNOSIS — H40023 Open angle with borderline findings, high risk, bilateral: Secondary | ICD-10-CM | POA: Diagnosis not present

## 2018-04-01 ENCOUNTER — Ambulatory Visit (INDEPENDENT_AMBULATORY_CARE_PROVIDER_SITE_OTHER): Payer: Medicare HMO | Admitting: *Deleted

## 2018-04-01 DIAGNOSIS — J4550 Severe persistent asthma, uncomplicated: Secondary | ICD-10-CM

## 2018-04-04 ENCOUNTER — Ambulatory Visit: Payer: Medicare HMO | Admitting: Internal Medicine

## 2018-04-30 ENCOUNTER — Other Ambulatory Visit: Payer: Self-pay | Admitting: Internal Medicine

## 2018-05-08 ENCOUNTER — Ambulatory Visit: Payer: Medicare HMO | Admitting: Internal Medicine

## 2018-05-09 ENCOUNTER — Encounter: Payer: Self-pay | Admitting: Internal Medicine

## 2018-05-09 ENCOUNTER — Ambulatory Visit (INDEPENDENT_AMBULATORY_CARE_PROVIDER_SITE_OTHER): Payer: Medicare HMO | Admitting: Internal Medicine

## 2018-05-09 VITALS — BP 114/76 | HR 77 | Temp 98.1°F | Ht 65.0 in | Wt 162.0 lb

## 2018-05-09 DIAGNOSIS — J3089 Other allergic rhinitis: Secondary | ICD-10-CM | POA: Diagnosis not present

## 2018-05-09 DIAGNOSIS — K219 Gastro-esophageal reflux disease without esophagitis: Secondary | ICD-10-CM

## 2018-05-09 DIAGNOSIS — J4550 Severe persistent asthma, uncomplicated: Secondary | ICD-10-CM

## 2018-05-09 DIAGNOSIS — I10 Essential (primary) hypertension: Secondary | ICD-10-CM

## 2018-05-09 MED ORDER — LOSARTAN POTASSIUM 100 MG PO TABS: 100.0000 mg | ORAL_TABLET | Freq: Every day | ORAL | 3 refills | Status: DC

## 2018-05-09 NOTE — Assessment & Plan Note (Signed)
stable overall by history and exam, recent data reviewed with pt, and pt to continue medical treatment as before,  to f/u any worsening symptoms or concerns  

## 2018-05-09 NOTE — Progress Notes (Signed)
Subjective:    Patient ID: Angelica [name], female    DOB: 12-05-1940, 77 y.o.   MRN: [id number]  HPI  Here to f/u; overall doing ok,  Pt denies chest pain, increasing sob or doe, wheezing, orthopnea, PND, increased LE swelling, palpitations, dizziness or syncope.  Pt denies new neurological symptoms such as new headache, or facial or extremity weakness or numbness.  Pt denies polydipsia, polyuria, or low sugar episode.  Pt states overall good compliance with meds, mostly trying to follow appropriate diet, with wt overall stable,  but little exercise however. Gained 6 lbs with less good diet.  Peak wt has been 250 in past.   Wt Readings from Last 3 Encounters:  05/09/18 162 lb (73.5 kg)  01/30/18 156 lb (70.8 kg)  01/24/18 158 lb 3.2 oz (71.8 kg)  Constip[ation improved on current meds. Does have several wks ongoing nasal allergy symptoms with clearish congestion, itch and sneezing, without fever, pain, ST, cough, swelling or wheezing, except the allergy shots seem to be helping.   Denies worsening reflux, abd pain, dysphagia, n/v, bowel change or blood. Past Medical History:  Diagnosis Date  . Asthma   . Bowel obstruction ([place])   . Environmental allergies   . GERD (gastroesophageal reflux disease)   . Glaucoma 05/09/2017  . H. pylori infection   . Hypertension   . Osteopenia 05/26/2017   Past Surgical History:  Procedure Laterality Date  . ABDOMINAL HYSTERECTOMY    . BREAST BIOPSY    . COLON SURGERY    . KNEE SURGERY    . LAPAROTOMY N/A 04/18/2015   Procedure: Exploratory laparotomy with lysis of adhesions, possible bowel resection;  Surgeon: Ralene Ok, MD;  Location: [facility];  Service: General;  Laterality: N/A;  . NASAL SINUS SURGERY    . THYROID SURGERY      reports that she has quit smoking. Her smoking use included cigarettes. She has never used smokeless tobacco. She reports that she does not drink alcohol or use drugs. family history is not on file. Allergies  Allergen  Reactions  . Asa Buff (Mag [Buffered Aspirin] Other (See Comments)    Excessive sweating  . Fish Allergy Other (See Comments)    Unknown reaction  . Other Other (See Comments)    Gelcaps; gel-containing capsules; extended release tablets  . Peanut-Containing Drug Products Other (See Comments)    From allergy test  . Penicillins Itching    Has patient had a PCN reaction causing immediate rash, facial/tongue/throat swelling, SOB or lightheadedness with hypotension: No Has patient had a PCN reaction causing severe rash involving mucus membranes or skin necrosis: No Has patient had a PCN reaction that required hospitalization No Has patient had a PCN reaction occurring within the last 10 years: No If all of the above answers are "NO", then may proceed with Cephalosporin use.  Marland Kitchen Shellfish-Derived Products Other (See Comments)    From allergy test  . Singulair [Montelukast Sodium] Hives  . Strawberry Extract Other (See Comments)    Worsened allergy symptoms   Current Outpatient Medications on File Prior to Visit  Medication Sig Dispense Refill  . acetaminophen (TYLENOL) 500 MG tablet Take 500 mg by mouth daily as needed for mild pain, moderate pain, fever or headache. For pain    . albuterol (PROVENTIL) (2.5 MG/3ML) 0.083% nebulizer solution Take 3 mLs (2.5 mg total) by nebulization 2 (two) times daily as needed for wheezing or shortness of breath. 75 mL 0  . albuterol (VENTOLIN  HFA) 108 (90 Base) MCG/ACT inhaler Inhale 2 puffs into the lungs every 4 (four) hours as needed for wheezing or shortness of breath. 1 Inhaler 1  . budesonide-formoterol (SYMBICORT) 160-4.5 MCG/ACT inhaler Inhale 2 puffs into the lungs 2 (two) times daily.    . cholecalciferol (VITAMIN D) 1000 units tablet Take 2,000 Units by mouth daily.    Marland Kitchen EPINEPHrine (EPIPEN 2-PAK) 0.3 mg/0.3 mL IJ SOAJ injection Use as directed for severe allergic reaction 2 Device 1  . hydrochlorothiazide (MICROZIDE) 12.5 MG capsule Take 1  capsule (12.5 mg total) by mouth daily. As needed only for swelling 90 capsule 3  . ipratropium (ATROVENT) 0.06 % nasal spray USE 1 SPRAY(S) IN EACH NOSTRIL THREE TIMES DAILY 15 mL 5  . ipratropium (ATROVENT) 0.06 % nasal spray Place 1 spray into both nostrils 3 (three) times daily. 15 mL 2  . lactulose (CHRONULAC) 10 GM/15ML solution Take 45 mLs (30 g total) by mouth 2 (two) times daily as needed for mild constipation. 240 mL 02  . montelukast (SINGULAIR) 10 MG tablet Take 1 tablet (10 mg total) by mouth at bedtime. 90 tablet 3  . omeprazole (PRILOSEC) 40 MG capsule Take 1 capsule (40 mg total) by mouth daily. For acid reflux 90 capsule 3  . polyethylene glycol powder (GLYCOLAX/MIRALAX) powder Take 17 g by mouth daily.    . Tiotropium Bromide Monohydrate (SPIRIVA RESPIMAT) 1.25 MCG/ACT AERS Inhale 2 puffs into the lungs daily.     . vitamin B-12 (CYANOCOBALAMIN) 1000 MCG tablet Take 1,000 mcg by mouth daily.     Current Facility-Administered Medications on File Prior to Visit  Medication Dose Route Frequency Provider Last Rate Last Dose  . Benralizumab SOSY 30 mg  30 mg Subcutaneous Q8 Weeks Kozlow, Donnamarie Poag, MD   30 mg at 04/01/18 1820   Review of Systems  Constitutional: Negative for other unusual diaphoresis or sweats HENT: Negative for ear discharge or swelling Eyes: Negative for other worsening visual disturbances Respiratory: Negative for stridor or other swelling  Gastrointestinal: Negative for worsening distension or other blood Genitourinary: Negative for retention or other urinary change Musculoskeletal: Negative for other MSK pain or swelling Skin: Negative for color change or other new lesions Neurological: Negative for worsening tremors and other numbness  Psychiatric/Behavioral: Negative for worsening agitation or other fatigue All other system neg per pt    Objective:   Physical Exam BP 114/76   Pulse 77   Temp 98.1 F (36.7 C) (Oral)   Ht 5\' 5"  (1.651 m)   Wt 162 lb  (73.5 kg)   SpO2 98%   BMI 26.96 kg/m  VS noted,  Constitutional: Pt appears in NAD HENT: Head: NCAT.  Right Ear: External ear normal.  Left Ear: External ear normal.  Eyes: . Pupils are equal, round, and reactive to light. Conjunctivae and EOM are normal Nose: without d/c or deformity Neck: Neck supple. Gross normal ROM Cardiovascular: Normal rate and regular rhythm.   Pulmonary/Chest: Effort normal and breath sounds without rales or wheezing.  Abd:  Soft, NT, ND, + BS, no organomegaly Neurological: Pt is alert. At baseline orientation, motor grossly intact Skin: Skin is warm. No rashes, other new lesions, no LE edema Psychiatric: Pt behavior is normal without agitation  No other exam findings Lab Results  Component Value Date   WBC 7.1 11/08/2017   HGB 13.6 11/08/2017   HCT 40.2 11/08/2017   PLT 264.0 11/08/2017   GLUCOSE 102 (H) 11/08/2017   CHOL 194 11/08/2017  TRIG 59.0 11/08/2017   HDL 75.90 11/08/2017   LDLCALC 106 (H) 11/08/2017   ALT 11 11/08/2017   AST 17 11/08/2017   NA 140 11/08/2017   K 4.1 11/08/2017   CL 105 11/08/2017   CREATININE 0.89 11/08/2017   BUN 15 11/08/2017   CO2 28 11/08/2017   TSH 2.02 11/08/2017       Assessment & Plan:

## 2018-05-09 NOTE — Patient Instructions (Signed)
Please continue all other medications as before, and refills have been done if requested.  Please have the pharmacy call with any other refills you may need.  Please continue your efforts at being more active, low cholesterol diet, and weight control.  You are otherwise up to date with prevention measures today.  Please keep your appointments with your specialists as you may have planned  Please return in 6 months, or sooner if needed, with Lab testing done 3-5 days before

## 2018-05-27 ENCOUNTER — Ambulatory Visit (INDEPENDENT_AMBULATORY_CARE_PROVIDER_SITE_OTHER): Payer: Medicare HMO | Admitting: *Deleted

## 2018-05-27 DIAGNOSIS — J4550 Severe persistent asthma, uncomplicated: Secondary | ICD-10-CM

## 2018-06-13 ENCOUNTER — Telehealth: Payer: Self-pay | Admitting: *Deleted

## 2018-06-13 NOTE — Telephone Encounter (Signed)
Patient called requesting samples of symbicort.

## 2018-06-13 NOTE — Telephone Encounter (Signed)
Spoke with patient.  She is aware sample has been placed up front.  Her daughter is going to come pick it up.

## 2018-06-18 ENCOUNTER — Other Ambulatory Visit: Payer: Self-pay | Admitting: Internal Medicine

## 2018-06-18 DIAGNOSIS — Z1231 Encounter for screening mammogram for malignant neoplasm of breast: Secondary | ICD-10-CM

## 2018-06-30 ENCOUNTER — Other Ambulatory Visit: Payer: Self-pay | Admitting: Internal Medicine

## 2018-07-02 ENCOUNTER — Telehealth: Payer: Self-pay | Admitting: Internal Medicine

## 2018-07-02 NOTE — Telephone Encounter (Signed)
Patient's daughter has dropped off a physician statement for [facility].   Form has been completed & placed in providers box to sign.   Call Taron to pick up. [phone]

## 2018-07-04 DIAGNOSIS — Z0279 Encounter for issue of other medical certificate: Secondary | ICD-10-CM

## 2018-07-04 NOTE — Telephone Encounter (Signed)
Forms signed, faxed, copy sent to scan &charged for.   LVM for Taron to inform it is ready to be picked up.

## 2018-07-09 ENCOUNTER — Ambulatory Visit (INDEPENDENT_AMBULATORY_CARE_PROVIDER_SITE_OTHER): Payer: Medicare HMO | Admitting: Internal Medicine

## 2018-07-09 ENCOUNTER — Ambulatory Visit (INDEPENDENT_AMBULATORY_CARE_PROVIDER_SITE_OTHER)
Admission: RE | Admit: 2018-07-09 | Discharge: 2018-07-09 | Disposition: A | Discharge status: Home / Self Care | Payer: Medicare HMO | Source: Ambulatory Visit | Attending: Internal Medicine | Admitting: Internal Medicine

## 2018-07-09 ENCOUNTER — Encounter: Payer: Self-pay | Admitting: Internal Medicine

## 2018-07-09 VITALS — BP 124/78 | HR 74 | Temp 97.8°F | Ht 65.0 in | Wt 156.0 lb

## 2018-07-09 DIAGNOSIS — J3089 Other allergic rhinitis: Secondary | ICD-10-CM

## 2018-07-09 DIAGNOSIS — M545 Low back pain, unspecified: Secondary | ICD-10-CM | POA: Insufficient documentation

## 2018-07-09 DIAGNOSIS — R739 Hyperglycemia, unspecified: Secondary | ICD-10-CM

## 2018-07-09 DIAGNOSIS — I10 Essential (primary) hypertension: Secondary | ICD-10-CM | POA: Diagnosis not present

## 2018-07-09 DIAGNOSIS — J4550 Severe persistent asthma, uncomplicated: Secondary | ICD-10-CM | POA: Diagnosis not present

## 2018-07-09 IMAGING — DX DG LUMBAR SPINE COMPLETE 4+V
5 series · 5 of 5 positions shown · non-contrast
Comparison: CT scan of [DATE].

CLINICAL DATA: Acute left-sided low back pain without known injury.
No sciatica.

EXAM:
LUMBAR SPINE - COMPLETE 4+ VIEW

[l-spine ap]
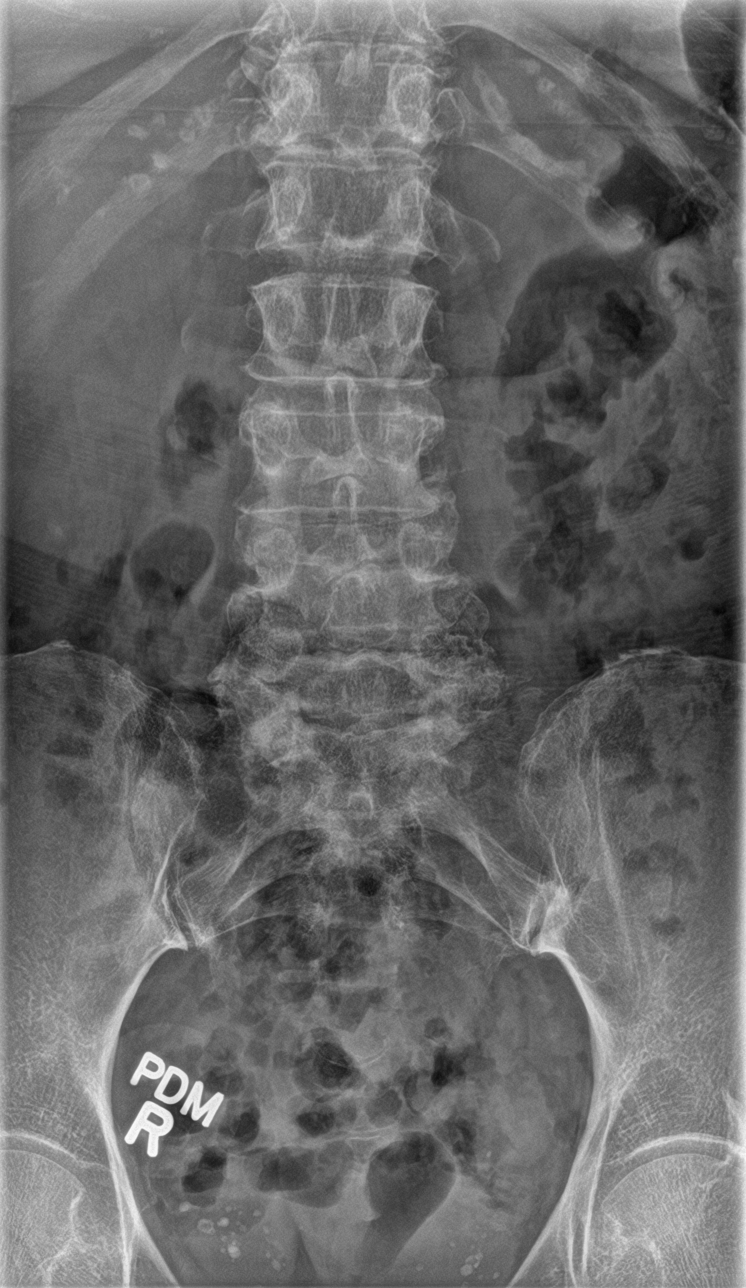

[l-spine obl (1 of 2)]
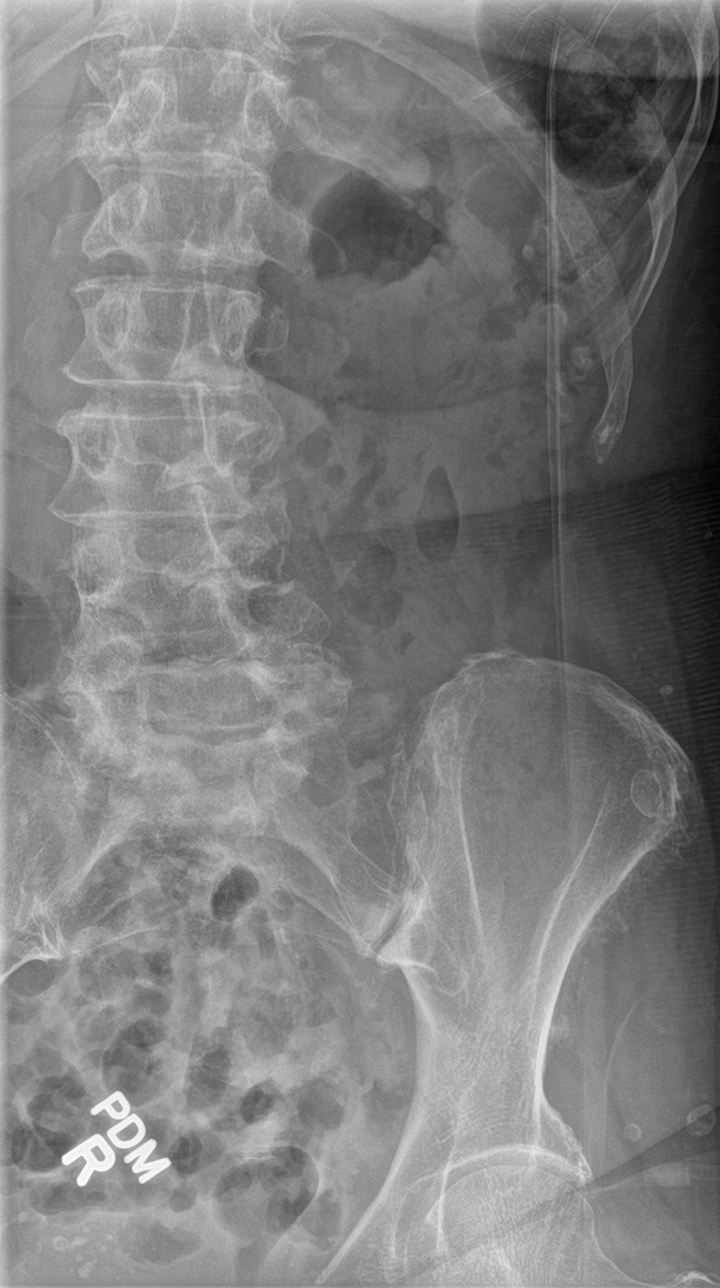

[l-spine obl (2 of 2)]
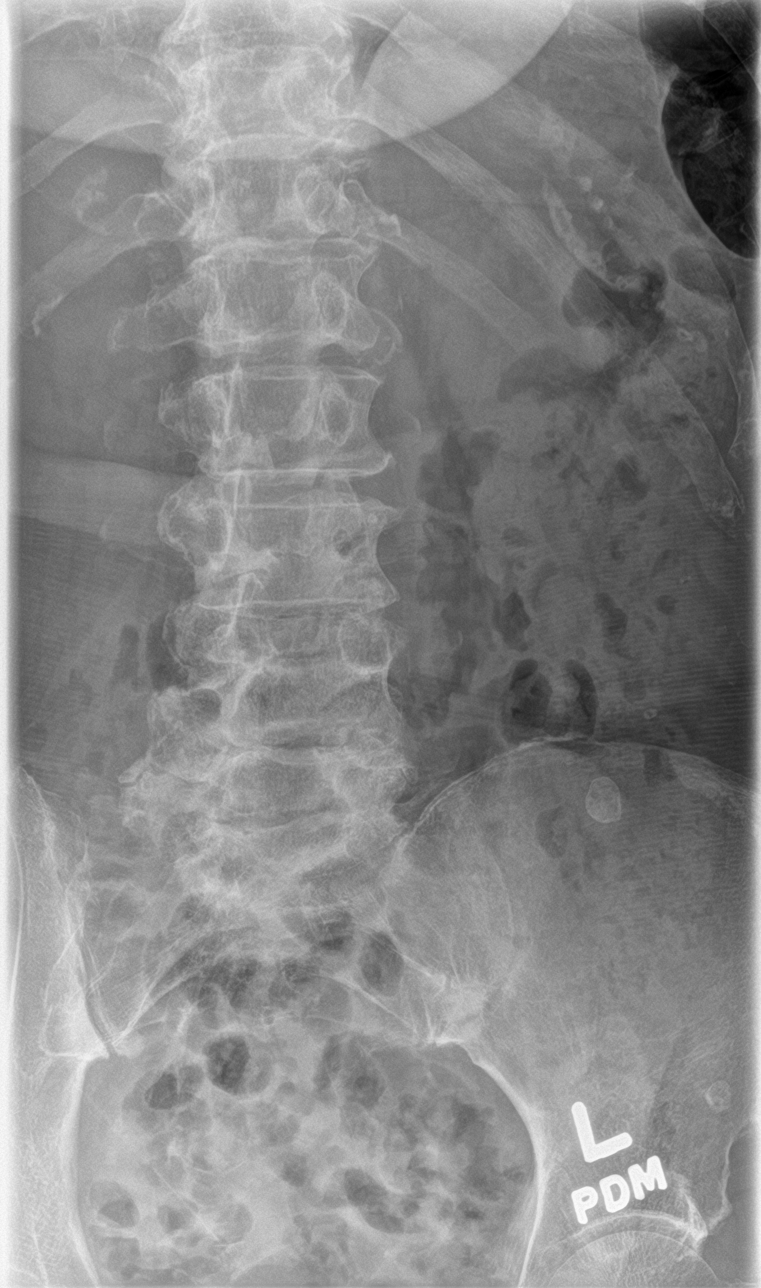

[l-spine lat]
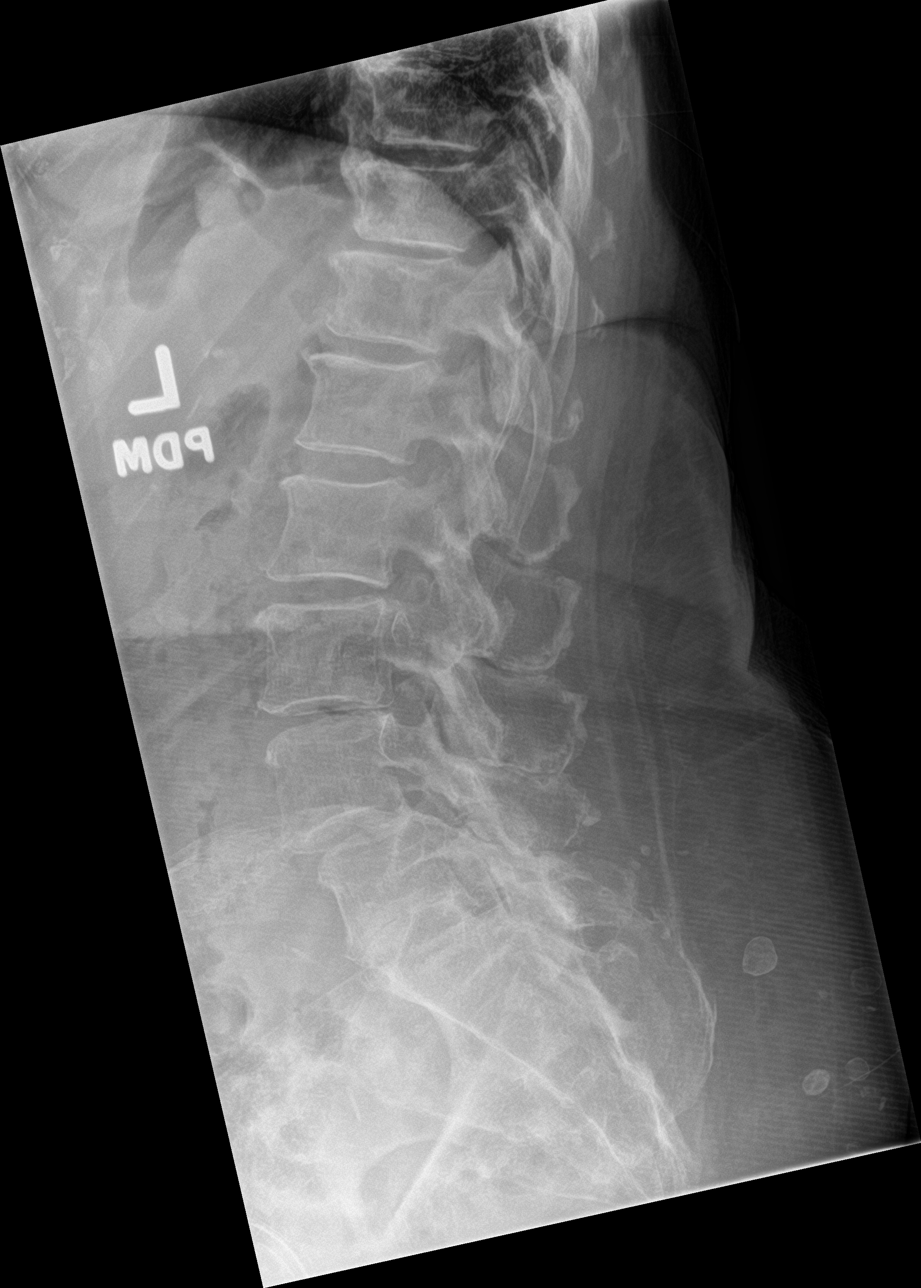

[l-spine spot]
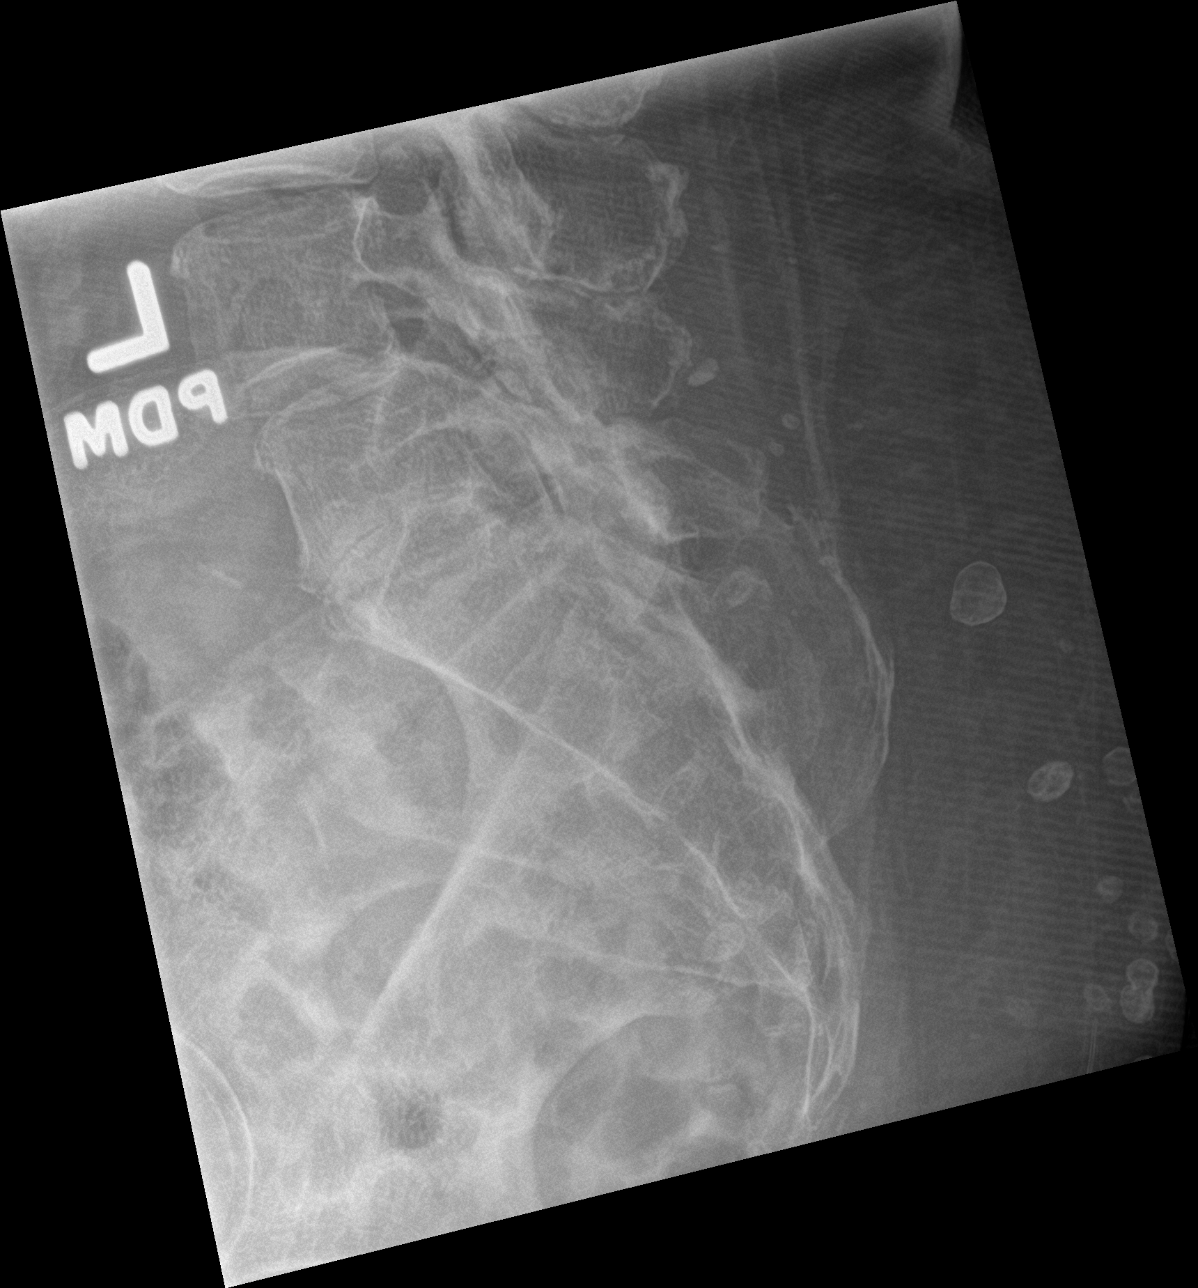

[5 of 5 positions shown; findings below may reference images not displayed]

FINDINGS: Stable grade 1 anterolisthesis of L4-5 is noted secondary to
posterior facet joint hypertrophy. Mild-to-moderate degenerative
disc disease is noted at L2-3, L3-4, L4-5 and L5-S1. No fracture is
noted.
IMPRESSION: Multilevel degenerative disc disease. No acute abnormality seen in
the lumbar spine.

## 2018-07-09 MED ORDER — MONTELUKAST SODIUM 10 MG PO TABS: 10.0000 mg | ORAL_TABLET | Freq: Every day | ORAL | 3 refills | Status: DC

## 2018-07-09 MED ORDER — TIZANIDINE HCL 2 MG PO TABS: 2.0000 mg | ORAL_TABLET | Freq: Four times a day (QID) | ORAL | 2 refills | Status: DC | PRN

## 2018-07-09 MED ORDER — TRAMADOL HCL 50 MG PO TABS: 50.0000 mg | ORAL_TABLET | Freq: Four times a day (QID) | ORAL | 0 refills | Status: DC | PRN

## 2018-07-09 MED ORDER — PREDNISONE 10 MG PO TABS: ORAL_TABLET | ORAL | 0 refills | Status: DC

## 2018-07-09 NOTE — Progress Notes (Signed)
Subjective:    Patient ID: Angelica [name], female    DOB: 25-Jan-1941, 78 y.o.   MRN: [id number]  HPI  Here to f/u, with c/o acute lower back pain with radiation worse left more than right buttocks x 2 weeks, moderate, intermittent, worse to stand up, better to sitting and after starts walking a bit.  Has had no lifting or other unusual activity. Denies urinary symptoms such as dysuria, frequency, urgency, flank pain, hematuria or n/v, fever, chills.  Denies worsening reflux, abd pain, dysphagia, n/v, bowel change or blood.   Pt denies fever, wt loss, night sweats, loss of appetite, or other constitutional symptoms.  No falls.  No prior imaging.  Also, Does have several wks ongoing nasal allergy symptoms with clearish congestion, itch and sneezing, without fever, pain, ST, cough, swelling or wheezing.   Pt denies polydipsia, polyuria.     Past Medical History:  Diagnosis Date  . Asthma   . Bowel obstruction ([place])   . Environmental allergies   . GERD (gastroesophageal reflux disease)   . Glaucoma 05/09/2017  . H. pylori infection   . Hypertension   . Osteopenia 05/26/2017   Past Surgical History:  Procedure Laterality Date  . ABDOMINAL HYSTERECTOMY    . BREAST BIOPSY    . COLON SURGERY    . KNEE SURGERY    . LAPAROTOMY N/A 04/18/2015   Procedure: Exploratory laparotomy with lysis of adhesions, possible bowel resection;  Surgeon: Ralene Ok, MD;  Location: [facility];  Service: General;  Laterality: N/A;  . NASAL SINUS SURGERY    . THYROID SURGERY      reports that she has quit smoking. Her smoking use included cigarettes. She has never used smokeless tobacco. She reports that she does not drink alcohol or use drugs. family history is not on file. Allergies  Allergen Reactions  . Asa Buff (Mag [Buffered Aspirin] Other (See Comments)    Excessive sweating  . Fish Allergy Other (See Comments)    Unknown reaction  . Other Other (See Comments)    Gelcaps; gel-containing capsules;  extended release tablets  . Peanut-Containing Drug Products Other (See Comments)    From allergy test  . Penicillins Itching    Has patient had a PCN reaction causing immediate rash, facial/tongue/throat swelling, SOB or lightheadedness with hypotension: No Has patient had a PCN reaction causing severe rash involving mucus membranes or skin necrosis: No Has patient had a PCN reaction that required hospitalization No Has patient had a PCN reaction occurring within the last 10 years: No If all of the above answers are "NO", then may proceed with Cephalosporin use.  Marland Kitchen Shellfish-Derived Products Other (See Comments)    From allergy test  . Strawberry Extract Other (See Comments)    Worsened allergy symptoms   Current Outpatient Medications on File Prior to Visit  Medication Sig Dispense Refill  . acetaminophen (TYLENOL) 500 MG tablet Take 500 mg by mouth daily as needed for mild pain, moderate pain, fever or headache. For pain    . albuterol (PROVENTIL) (2.5 MG/3ML) 0.083% nebulizer solution Take 3 mLs (2.5 mg total) by nebulization 2 (two) times daily as needed for wheezing or shortness of breath. 75 mL 0  . albuterol (VENTOLIN HFA) 108 (90 Base) MCG/ACT inhaler Inhale 2 puffs into the lungs every 4 (four) hours as needed for wheezing or shortness of breath. 1 Inhaler 1  . budesonide-formoterol (SYMBICORT) 160-4.5 MCG/ACT inhaler Inhale 2 puffs into the lungs 2 (two) times daily.    Marland Kitchen  cholecalciferol (VITAMIN D) 1000 units tablet Take 2,000 Units by mouth daily.    Marland Kitchen EPINEPHrine (EPIPEN 2-PAK) 0.3 mg/0.3 mL IJ SOAJ injection Use as directed for severe allergic reaction 2 Device 1  . hydrochlorothiazide (MICROZIDE) 12.5 MG capsule Take 1 capsule (12.5 mg total) by mouth daily. As needed only for swelling 90 capsule 3  . ipratropium (ATROVENT) 0.06 % nasal spray USE 1 SPRAY(S) IN EACH NOSTRIL THREE TIMES DAILY 15 mL 5  . ipratropium (ATROVENT) 0.06 % nasal spray Place 1 spray into both nostrils 3  (three) times daily. 15 mL 2  . lactulose (CHRONULAC) 10 GM/15ML solution Take 45 mLs (30 g total) by mouth 2 (two) times daily as needed for mild constipation. 240 mL 02  . losartan (COZAAR) 100 MG tablet TAKE 1 TABLET EVERY DAY 90 tablet 3  . omeprazole (PRILOSEC) 40 MG capsule Take 1 capsule (40 mg total) by mouth daily. For acid reflux 90 capsule 3  . polyethylene glycol powder (GLYCOLAX/MIRALAX) powder Take 17 g by mouth daily.    . Tiotropium Bromide Monohydrate (SPIRIVA RESPIMAT) 1.25 MCG/ACT AERS Inhale 2 puffs into the lungs daily.     . vitamin B-12 (CYANOCOBALAMIN) 1000 MCG tablet Take 1,000 mcg by mouth daily.     Current Facility-Administered Medications on File Prior to Visit  Medication Dose Route Frequency Provider Last Rate Last Dose  . Benralizumab SOSY 30 mg  30 mg Subcutaneous Q8 Weeks Kozlow, Donnamarie Poag, MD   30 mg at 05/27/18 1741   Review of Systems  Constitutional: Negative for other unusual diaphoresis or sweats HENT: Negative for ear discharge or swelling Eyes: Negative for other worsening visual disturbances Respiratory: Negative for stridor or other swelling  Gastrointestinal: Negative for worsening distension or other blood Genitourinary: Negative for retention or other urinary change Musculoskeletal: Negative for other MSK pain or swelling Skin: Negative for color change or other new lesions Neurological: Negative for worsening tremors and other numbness  Psychiatric/Behavioral: Negative for worsening agitation or other fatigue All other system neg per pt    Objective:   Physical Exam BP 124/78   Pulse 74   Temp 97.8 F (36.6 C) (Oral)   Ht 5\' 5"  (1.651 m)   Wt 156 lb (70.8 kg)   SpO2 98%   BMI 25.96 kg/m  VS noted,  Constitutional: Pt appears in NAD HENT: Head: NCAT.  Right Ear: External ear normal.  Left Ear: External ear normal.  Eyes: . Pupils are equal, round, and reactive to light. Conjunctivae and EOM are normal Nose: without d/c or  deformity Neck: Neck supple. Gross normal ROM Cardiovascular: Normal rate and regular rhythm.   Pulmonary/Chest: Effort normal and breath sounds without rales or wheezing.  Abd:  Soft, NT, ND, + BS, no organomegaly Spine nontender in midline or paravertebral Neurological: Pt is alert. At baseline orientation, motor grossly intact Skin: Skin is warm. No rashes, other new lesions, no LE edema Psychiatric: Pt behavior is normal without agitation  No other exam findings Lab Results  Component Value Date   WBC 7.1 11/08/2017   HGB 13.6 11/08/2017   HCT 40.2 11/08/2017   PLT 264.0 11/08/2017   GLUCOSE 102 (H) 11/08/2017   CHOL 194 11/08/2017   TRIG 59.0 11/08/2017   HDL 75.90 11/08/2017   LDLCALC 106 (H) 11/08/2017   ALT 11 11/08/2017   AST 17 11/08/2017   NA 140 11/08/2017   K 4.1 11/08/2017   CL 105 11/08/2017   CREATININE 0.89 11/08/2017  BUN 15 11/08/2017   CO2 28 11/08/2017   TSH 2.02 11/08/2017      Assessment & Plan:

## 2018-07-09 NOTE — Patient Instructions (Addendum)
Please take all new medication as prescribed - the pain medication, prednisone, and muscle relaxer as needed  Please continue all other medications as before, and refills have been done if requested - the allergy medication (singulair)  Please have the pharmacy call with any other refills you may need.  Please continue your efforts at being more active, low cholesterol diet, and weight control.  Please keep your appointments with your specialists as you may have planned  Please go to the XRAY Department in the Basement (go straight as you get off the elevator) for the x-ray testing  You will be contacted by phone if any changes need to be made immediately.  Otherwise, you will receive a letter about your results with an explanation, but please check with MyChart first.  Please remember to sign up for MyChart if you have not done so, as this will be important to you in the future with finding out test results, communicating by private email, and scheduling acute appointments online when needed.  Please return in June 26, or sooner if needed

## 2018-07-10 NOTE — Assessment & Plan Note (Signed)
stable overall by history and exam, recent data reviewed with pt, and pt to continue medical treatment as before,  to f/u any worsening symptoms or concerns  

## 2018-07-10 NOTE — Assessment & Plan Note (Signed)
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

## 2018-07-10 NOTE — Assessment & Plan Note (Signed)
Ok to restart singulair 10 qd,  to f/u any worsening symptoms or concerns

## 2018-07-23 ENCOUNTER — Telehealth: Payer: Self-pay | Admitting: *Deleted

## 2018-07-23 NOTE — Telephone Encounter (Signed)
Can you look to see if she has dose on hand?  She did have appt for Jan but I dont see she was ever scheduled again.  Otherwise you can put her on schedule for at least two weeks out and let me know so I can schedule her

## 2018-07-23 NOTE — Telephone Encounter (Signed)
Double checked unfortunately there was no medication. Called the patient and explained and an appointment has been made for her next injection on 08/06/2018.

## 2018-07-23 NOTE — Telephone Encounter (Signed)
Patient was calling to check on the status of receiving Fasenra. Please advise.

## 2018-07-24 ENCOUNTER — Telehealth: Payer: Self-pay | Admitting: *Deleted

## 2018-07-24 NOTE — Telephone Encounter (Signed)
Since she has not been on schedule for injection it will be a least week to get medication from [facility].  Caryl Pina had called patient yesterday and scheduled appt for 3/18 so she should plan on coming then

## 2018-07-24 NOTE — Telephone Encounter (Signed)
Thank you :)

## 2018-07-24 NOTE — Telephone Encounter (Signed)
Will order medication for appt

## 2018-07-24 NOTE — Telephone Encounter (Signed)
Patient came in to get Fasenra shot on Tuesday 07/22/2018 but medication was not here due to patient not being on the schedule. Daughter,Taron, wants [place] to call her and schedule her for Friday PM if possible please advise how to proceed.

## 2018-07-25 ENCOUNTER — Ambulatory Visit: Payer: Medicare HMO | Admitting: Allergy

## 2018-07-25 ENCOUNTER — Encounter: Payer: Self-pay | Admitting: Allergy

## 2018-07-25 VITALS — BP 130/78 | HR 74 | Temp 97.6°F | Resp 18

## 2018-07-25 DIAGNOSIS — J3089 Other allergic rhinitis: Secondary | ICD-10-CM

## 2018-07-25 DIAGNOSIS — K219 Gastro-esophageal reflux disease without esophagitis: Secondary | ICD-10-CM | POA: Diagnosis not present

## 2018-07-25 DIAGNOSIS — J4550 Severe persistent asthma, uncomplicated: Secondary | ICD-10-CM | POA: Diagnosis not present

## 2018-07-25 DIAGNOSIS — J339 Nasal polyp, unspecified: Secondary | ICD-10-CM | POA: Diagnosis not present

## 2018-07-25 DIAGNOSIS — L509 Urticaria, unspecified: Secondary | ICD-10-CM | POA: Diagnosis not present

## 2018-07-25 HISTORY — DX: Urticaria, unspecified: L50.9

## 2018-07-25 MED ORDER — BUDESONIDE-FORMOTEROL FUMARATE 80-4.5 MCG/ACT IN AERO: 2.0000 | INHALATION_SPRAY | Freq: Two times a day (BID) | RESPIRATORY_TRACT | 5 refills | Status: DC

## 2018-07-25 NOTE — Patient Instructions (Signed)
  1. For asthma flares or upper respiratory infections begin Symbicort 80-2 puffs twice a day for 2 weeks or until you are cough and wheeze free    2. Montelukast 10mg  -1 tablet 1 time per day  3.  Flonase -1 spray each nostril twice a day. Consider nasal saline rinses as needed for a stuffy nose  4.  Omeprazole 40mg  -1 tablet 1 time per day  5. Fasenra every 8 weeks  6.  If needed:   A.  Ventolin HFA 2 puffs or albuterol nebulization every 4-6 hours  B.  OTC antihistamine and OTC Mucinex DM  C. Nasal saline spray  D. Ipratropium 0.06% 2 sprays each nostril 3 times per day   7. Return to clinic in 6 months or earlier if problem

## 2018-07-25 NOTE — Progress Notes (Signed)
[facility] [place] [address] Dept: [phone]  FOLLOW UP NOTE  Patient ID: Angelica [name], female    DOB: February 15, 1941  Age: 78 y.o. MRN: [id number] Date of Office Visit: 07/25/2018  Assessment  Chief Complaint: Follow-up  HPI Angelica [name] is a 78 year old female who presents to the clinic for a follow-up visit.  She reports that her asthma has been well controlled with no shortness of breath, no wheezing, and coughing when she comes in contact with strong perfume.  She continues to use Symbicort 80 as needed, montelukast daily, and has not used albuterol for greater than 3 months.  She continues Fasenra injections every 8 weeks and reports that her asthma symptoms have been reduced well receiving this medication.  Allergic rhinitis is reported as well controlled with Flonase 1 time a day.  Reflux is reported as well controlled with omeprazole once a day and avoidance of foods that irritate her reflux such as veggies.  She reports urticaria is well controlled with no antihistamines.  Her current medications are listed in the chart.   Drug Allergies:  Allergies  Allergen Reactions  . Asa Buff (Mag [Buffered Aspirin] Other (See Comments)    Excessive sweating  . Fish Allergy Other (See Comments)    Unknown reaction  . Other Other (See Comments)    Gelcaps; gel-containing capsules; extended release tablets  . Peanut-Containing Drug Products Other (See Comments)    From allergy test  . Penicillins Itching    Has patient had a PCN reaction causing immediate rash, facial/tongue/throat swelling, SOB or lightheadedness with hypotension: No Has patient had a PCN reaction causing severe rash involving mucus membranes or skin necrosis: No Has patient had a PCN reaction that required hospitalization No Has patient had a PCN reaction occurring within the last 10 years: No If all of the above answers are "NO", then may proceed with Cephalosporin use.  . Shellfish-Derived  Products Other (See Comments)    From allergy test  . Strawberry Extract Other (See Comments)    Worsened allergy symptoms    Physical Exam: BP 130/78 (BP Location: Left Arm, Patient Position: Sitting, Cuff Size: Normal)   Pulse 74   Temp 97.6 F (36.4 C) (Oral)   Resp 18   SpO2 97%    Physical Exam Vitals signs reviewed.  Constitutional:      Appearance: Normal appearance.  HENT:     Head: Normocephalic and atraumatic.     Right Ear: Tympanic membrane normal.     Left Ear: Tympanic membrane normal.     Nose:     Comments: Bilateral nares slightly erythematous and edematous with clear nasal drainage noted.  Pharynx normal.  Ears normal.  Eyes normal.    Mouth/Throat:     Pharynx: Oropharynx is clear.  Eyes:     Conjunctiva/sclera: Conjunctivae normal.  Neck:     Musculoskeletal: Normal range of motion and neck supple.  Cardiovascular:     Rate and Rhythm: Normal rate and regular rhythm.     Heart sounds: Normal heart sounds. No murmur.  Pulmonary:     Effort: Pulmonary effort is normal.     Breath sounds: Normal breath sounds.     Comments: Lungs clear to auscultation Musculoskeletal: Normal range of motion.  Skin:    General: Skin is warm and dry.  Neurological:     Mental Status: She is alert and oriented to person, place, and time.  Psychiatric:  Mood and Affect: Mood normal.        Behavior: Behavior normal.        Thought Content: Thought content normal.        Judgment: Judgment normal.     Diagnostics: FVC 2.77, FEV1 1.90.  Predicted FVC 2.21, predicted FEV1 1.71.  Spirometry indicates normal ventilatory function.  Assessment and Plan: 1. Asthma, severe persistent, well-controlled   2. LPRD (laryngopharyngeal reflux disease)   3. Other allergic rhinitis   4. Nasal polyposis   5. Urticaria     Meds ordered this encounter  Medications  . budesonide-formoterol (SYMBICORT) 80-4.5 MCG/ACT inhaler    Sig: Inhale 2 puffs into the lungs 2 (two)  times daily. Use for asthma flares 2 puffs twice a day for 2 weeks or until cough and wheeze free    Dispense:  1 Inhaler    Refill:  5    Patient Instructions    1. For asthma flares or upper respiratory infections begin Symbicort 80-2 puffs twice a day for 2 weeks or until you are cough and wheeze free    2. Montelukast 10mg  -1 tablet 1 time per day  3.  Flonase -1 spray each nostril twice a day. Consider nasal saline rinses as needed for a stuffy nose  4.  Omeprazole 40mg  -1 tablet 1 time per day  5. Fasenra every 8 weeks  6.  If needed:   A.  Ventolin HFA 2 puffs or albuterol nebulization every 4-6 hours  B.  OTC antihistamine and OTC Mucinex DM  C. Nasal saline spray  D. Ipratropium 0.06% 2 sprays each nostril 3 times per day   7. Return to clinic in 6 months or earlier if problem   Return in about 6 months (around 01/25/2019), or if symptoms worsen or fail to improve.    Thank you for the opportunity to care for this patient.  Please do not hesitate to contact me with questions.  Gareth Morgan, FNP [facility]

## 2018-08-06 ENCOUNTER — Other Ambulatory Visit: Payer: Self-pay

## 2018-08-06 ENCOUNTER — Ambulatory Visit (INDEPENDENT_AMBULATORY_CARE_PROVIDER_SITE_OTHER): Payer: Medicare HMO

## 2018-08-06 DIAGNOSIS — J4550 Severe persistent asthma, uncomplicated: Secondary | ICD-10-CM

## 2018-08-29 ENCOUNTER — Ambulatory Visit: Payer: Medicare HMO

## 2018-09-10 ENCOUNTER — Ambulatory Visit: Payer: Medicare HMO

## 2018-09-29 ENCOUNTER — Ambulatory Visit: Payer: Medicare HMO

## 2018-10-01 ENCOUNTER — Ambulatory Visit (INDEPENDENT_AMBULATORY_CARE_PROVIDER_SITE_OTHER): Payer: Medicare HMO | Admitting: *Deleted

## 2018-10-01 DIAGNOSIS — J4550 Severe persistent asthma, uncomplicated: Secondary | ICD-10-CM | POA: Diagnosis not present

## 2018-11-04 ENCOUNTER — Ambulatory Visit: Payer: Medicare HMO

## 2018-11-05 ENCOUNTER — Other Ambulatory Visit: Payer: Self-pay | Admitting: Internal Medicine

## 2018-11-06 ENCOUNTER — Telehealth: Payer: Self-pay | Admitting: *Deleted

## 2018-11-06 ENCOUNTER — Other Ambulatory Visit: Payer: Self-pay | Admitting: *Deleted

## 2018-11-06 MED ORDER — FLUTICASONE PROPIONATE 50 MCG/ACT NA SUSP: 1.0000 | Freq: Two times a day (BID) | NASAL | 3 refills | Status: DC

## 2018-11-06 NOTE — Telephone Encounter (Signed)
Patient called states she has been having nasal stuffiness since yesterday. Advised to use saline rinses states she is using simply saline at this time and no relief. States she is using ipratropium nasal spray, claritin and montelukast. Patient denied having fluticasone nasal spray sent in rx for this and advised patient to try this a couple days and use nasal saline rinses prior to medicated spray this will help with nasal congestion. Patient verbalized understanding and will go to pharmacy and get help with picking up distilled water for nasal saline rinses. If not better she will give [place] a call back

## 2018-11-06 NOTE — Telephone Encounter (Signed)
Daughter called to clarify what patient is needing. Advised we sent in nasal spray and she needs to get Neilmed sinus rinse with distilled water to use prior to medicated spray. Daughter verbalized understanding

## 2018-11-14 ENCOUNTER — Ambulatory Visit (INDEPENDENT_AMBULATORY_CARE_PROVIDER_SITE_OTHER): Payer: Medicare HMO | Admitting: Internal Medicine

## 2018-11-14 DIAGNOSIS — R739 Hyperglycemia, unspecified: Secondary | ICD-10-CM | POA: Diagnosis not present

## 2018-11-14 DIAGNOSIS — K219 Gastro-esophageal reflux disease without esophagitis: Secondary | ICD-10-CM | POA: Diagnosis not present

## 2018-11-14 DIAGNOSIS — Z0001 Encounter for general adult medical examination with abnormal findings: Secondary | ICD-10-CM | POA: Diagnosis not present

## 2018-11-14 DIAGNOSIS — E611 Iron deficiency: Secondary | ICD-10-CM | POA: Diagnosis not present

## 2018-11-14 DIAGNOSIS — E538 Deficiency of other specified B group vitamins: Secondary | ICD-10-CM

## 2018-11-14 DIAGNOSIS — K5900 Constipation, unspecified: Secondary | ICD-10-CM

## 2018-11-14 DIAGNOSIS — E559 Vitamin D deficiency, unspecified: Secondary | ICD-10-CM | POA: Diagnosis not present

## 2018-11-14 DIAGNOSIS — J4550 Severe persistent asthma, uncomplicated: Secondary | ICD-10-CM | POA: Diagnosis not present

## 2018-11-14 MED ORDER — LINACLOTIDE 72 MCG PO CAPS: 72.0000 ug | ORAL_CAPSULE | Freq: Every day | ORAL | 5 refills | Status: DC | PRN

## 2018-11-14 NOTE — Progress Notes (Signed)
Patient ID: Angelica [name], female   DOB: 03-13-1941, 78 y.o.   MRN: [id number]  Virtual Visit via Video Note  I connected with Angelica [name] on 11/14/18 at  3:20 PM EDT by a video enabled telemedicine application and verified that I am speaking with the correct person using two identifiers.  Location: Patient: at home Provider: at office   I discussed the limitations of evaluation and management by telemedicine and the availability of in person appointments. The patient expressed understanding and agreed to proceed.  History of Present Illness: Here for wellness and f/u;  Overall doing ok;  Pt denies Chest pain, worsening SOB, DOE, wheezing, orthopnea, PND, worsening LE edema, palpitations, dizziness or syncope.  Pt denies neurological change such as new headache, facial or extremity weakness.  Pt denies polydipsia, polyuria, or low sugar symptoms. Pt states overall good compliance with treatment and medications, good tolerability, and has been trying to follow appropriate diet.  Pt denies worsening depressive symptoms, suicidal ideation or panic. No fever, night sweats, wt loss, loss of appetite, or other constitutional symptoms.  Pt states good ability with ADL's, has low fall risk, home safety reviewed and adequate, no other significant changes in hearing or vision, and only occasionally active with exercise.  Denies worsening reflux, abd pain, dysphagia, n/v, or blood, but has had several months worsening intermittent mild to mod crampy abd pain and constipation, just not better with lactulose, nothing else seems to make better or worse Past Medical History:  Diagnosis Date  . Asthma   . Bowel obstruction ([place])   . Environmental allergies   . GERD (gastroesophageal reflux disease)   . Glaucoma 05/09/2017  . H. pylori infection   . Hypertension   . Osteopenia 05/26/2017  . Urticaria 07/25/2018   Past Surgical History:  Procedure Laterality Date  . ABDOMINAL HYSTERECTOMY    . BREAST  BIOPSY    . COLON SURGERY    . KNEE SURGERY    . LAPAROTOMY N/A 04/18/2015   Procedure: Exploratory laparotomy with lysis of adhesions, possible bowel resection;  Surgeon: Ralene Ok, MD;  Location: [place];  Service: General;  Laterality: N/A;  . NASAL SINUS SURGERY    . THYROID SURGERY      reports that she has quit smoking. Her smoking use included cigarettes. She has never used smokeless tobacco. She reports that she does not drink alcohol or use drugs. family history is not on file. Allergies  Allergen Reactions  . Asa Buff (Mag [Buffered Aspirin] Other (See Comments)    Excessive sweating  . Fish Allergy Other (See Comments)    Unknown reaction  . Other Other (See Comments)    Gelcaps; gel-containing capsules; extended release tablets  . Peanut-Containing Drug Products Other (See Comments)    From allergy test  . Penicillins Itching    Has patient had a PCN reaction causing immediate rash, facial/tongue/throat swelling, SOB or lightheadedness with hypotension: No Has patient had a PCN reaction causing severe rash involving mucus membranes or skin necrosis: No Has patient had a PCN reaction that required hospitalization No Has patient had a PCN reaction occurring within the last 10 years: No If all of the above answers are "NO", then may proceed with Cephalosporin use.  Marland Kitchen Shellfish-Derived Products Other (See Comments)    From allergy test  . Strawberry Extract Other (See Comments)    Worsened allergy symptoms   Current Outpatient Medications on File Prior to Visit  Medication Sig Dispense Refill  .  acetaminophen (TYLENOL) 500 MG tablet Take 500 mg by mouth daily as needed for mild pain, moderate pain, fever or headache. For pain    . albuterol (PROVENTIL) (2.5 MG/3ML) 0.083% nebulizer solution Take 3 mLs (2.5 mg total) by nebulization 2 (two) times daily as needed for wheezing or shortness of breath. 75 mL 0  . albuterol (VENTOLIN HFA) 108 (90 Base) MCG/ACT inhaler Inhale  2 puffs into the lungs every 4 (four) hours as needed for wheezing or shortness of breath. 1 Inhaler 1  . budesonide-formoterol (SYMBICORT) 160-4.5 MCG/ACT inhaler Inhale 2 puffs into the lungs 2 (two) times daily.    . budesonide-formoterol (SYMBICORT) 80-4.5 MCG/ACT inhaler Inhale 2 puffs into the lungs 2 (two) times daily. Use for asthma flares 2 puffs twice a day for 2 weeks or until cough and wheeze free 1 Inhaler 5  . cholecalciferol (VITAMIN D) 1000 units tablet Take 2,000 Units by mouth daily.    Marland Kitchen EPINEPHrine (EPIPEN 2-PAK) 0.3 mg/0.3 mL IJ SOAJ injection Use as directed for severe allergic reaction 2 Device 1  . fluticasone (FLONASE) 50 MCG/ACT nasal spray Place 1 spray into both nostrils 2 (two) times daily. 16 g 3  . hydrochlorothiazide (MICROZIDE) 12.5 MG capsule Take 1 capsule (12.5 mg total) by mouth daily. As needed only for swelling 90 capsule 3  . ipratropium (ATROVENT) 0.06 % nasal spray Place 1 spray into both nostrils 3 (three) times daily. 15 mL 2  . lactulose (CHRONULAC) 10 GM/15ML solution Take 45 mLs (30 g total) by mouth 2 (two) times daily as needed for mild constipation. 240 mL 02  . losartan (COZAAR) 100 MG tablet TAKE 1 TABLET EVERY DAY 90 tablet 3  . montelukast (SINGULAIR) 10 MG tablet Take 1 tablet (10 mg total) by mouth at bedtime. 90 tablet 3  . omeprazole (PRILOSEC) 40 MG capsule TAKE 1 CAPSULE DAILY FOR ACID REFLUX 90 capsule 3  . polyethylene glycol powder (GLYCOLAX/MIRALAX) powder Take 17 g by mouth daily.    . predniSONE (DELTASONE) 10 MG tablet 3 tabs by mouth per day for 3 days,2tabs per day for 3 days,1tab per day for 3 days 18 tablet 0  . vitamin B-12 (CYANOCOBALAMIN) 1000 MCG tablet Take 1,000 mcg by mouth daily.     Current Facility-Administered Medications on File Prior to Visit  Medication Dose Route Frequency Provider Last Rate Last Dose  . Benralizumab SOSY 30 mg  30 mg Subcutaneous Q8 Weeks Kozlow, Donnamarie Poag, MD   30 mg at 10/01/18 1408     Observations/Objective: Alert, NAD, appropriate mood and affect, resps normal, cn 2-12 intact, moves all 4s, no visible rash or swelling  Lab Results  Component Value Date   WBC 7.1 11/08/2017   HGB 13.6 11/08/2017   HCT 40.2 11/08/2017   PLT 264.0 11/08/2017   GLUCOSE 102 (H) 11/08/2017   CHOL 194 11/08/2017   TRIG 59.0 11/08/2017   HDL 75.90 11/08/2017   LDLCALC 106 (H) 11/08/2017   ALT 11 11/08/2017   AST 17 11/08/2017   NA 140 11/08/2017   K 4.1 11/08/2017   CL 105 11/08/2017   CREATININE 0.89 11/08/2017   BUN 15 11/08/2017   CO2 28 11/08/2017   TSH 2.02 11/08/2017   Assessment and Plan: See notes  Follow Up Instructions: See notes   I discussed the assessment and treatment plan with the patient. The patient was provided an opportunity to ask questions and all were answered. The patient agreed with the plan and demonstrated  an understanding of the instructions.   The patient was advised to call back or seek an in-person evaluation if the symptoms worsen or if the condition fails to improve as anticipated.   Cathlean Cower, MD

## 2018-11-14 NOTE — Patient Instructions (Signed)
Please take all new medication as prescribed - the Linzess for constipation  Please continue all other medications as before, and refills have been done if requested.  Please have the pharmacy call with any other refills you may need.  Please continue your efforts at being more active, low cholesterol diet, and weight control.  You are otherwise up to date with prevention measures today.  Please keep your appointments with your specialists as you may have planned  Please go to the LAB in the Basement (turn left off the elevator) for the tests to be done next week  You will be contacted by phone if any changes need to be made immediately.  Otherwise, you will receive a letter about your results with an explanation, but please check with MyChart first.  Please remember to sign up for MyChart if you have not done so, as this will be important to you in the future with finding out test results, communicating by private email, and scheduling acute appointments online when needed.  Please return in 6 months, or sooner if needed

## 2018-11-16 ENCOUNTER — Encounter: Payer: Self-pay | Admitting: Internal Medicine

## 2018-11-16 NOTE — Assessment & Plan Note (Signed)
stable overall by history and exam, recent data reviewed with pt, and pt to continue medical treatment as before,  to f/u any worsening symptoms or concerns  

## 2018-11-16 NOTE — Assessment & Plan Note (Signed)
Worsening not better any longer with lactulse prn, has tried mult otc as well, for Linzess asd prn,  to f/u any worsening symptoms or concerns  In addition to the time spent performing CPE, I spent an additional 25 minutes face to face,in which greater than 50% of this time was spent in counseling and coordination of care for patient's acute illness as documented, including the differential dx, treatment, further evaluation and other management of constipation, GERD, hyperglycemia and asthma

## 2018-11-18 ENCOUNTER — Other Ambulatory Visit (INDEPENDENT_AMBULATORY_CARE_PROVIDER_SITE_OTHER): Payer: Medicare HMO

## 2018-11-18 DIAGNOSIS — R739 Hyperglycemia, unspecified: Secondary | ICD-10-CM

## 2018-11-18 DIAGNOSIS — Z0001 Encounter for general adult medical examination with abnormal findings: Secondary | ICD-10-CM

## 2018-11-18 DIAGNOSIS — E611 Iron deficiency: Secondary | ICD-10-CM | POA: Diagnosis not present

## 2018-11-18 DIAGNOSIS — E559 Vitamin D deficiency, unspecified: Secondary | ICD-10-CM

## 2018-11-18 DIAGNOSIS — E538 Deficiency of other specified B group vitamins: Secondary | ICD-10-CM | POA: Diagnosis not present

## 2018-11-18 LAB — BASIC METABOLIC PANEL
BUN: 12 mg/dL (ref 6–23)
CO2: 23 mEq/L (ref 19–32)
Calcium: 9.1 mg/dL (ref 8.4–10.5)
Chloride: 106 mEq/L (ref 96–112)
Creatinine, Ser: 0.8 mg/dL (ref 0.40–1.20)
GFR: 83.74 mL/min (ref >60.00)
Glucose, Bld: 83 mg/dL (ref 70–99)
Potassium: 3.8 mEq/L (ref 3.5–5.1)
Sodium: 137 mEq/L (ref 135–145)

## 2018-11-18 LAB — LIPID PANEL
Cholesterol: 199 mg/dL (ref 0–200)
HDL: 68.4 mg/dL (ref >39.00)
LDL Cholesterol: 117 mg/dL — ABNORMAL HIGH (ref 0–99)
NonHDL: 130.22
Total CHOL/HDL Ratio: 3
Triglycerides: 65 mg/dL (ref 0.0–149.0)
VLDL: 13 mg/dL (ref 0.0–40.0)

## 2018-11-18 LAB — HEPATIC FUNCTION PANEL
ALT: 7 U/L (ref 0–35)
AST: 17 U/L (ref 0–37)
Albumin: 4 g/dL (ref 3.5–5.2)
Alkaline Phosphatase: 85 U/L (ref 39–117)
Bilirubin, Direct: 0.1 mg/dL (ref 0.0–0.3)
Total Bilirubin: 0.4 mg/dL (ref 0.2–1.2)
Total Protein: 7.5 g/dL (ref 6.0–8.3)

## 2018-11-18 LAB — URINALYSIS, ROUTINE W REFLEX MICROSCOPIC
Bilirubin Urine: NEGATIVE
Hgb urine dipstick: NEGATIVE
Ketones, ur: NEGATIVE
Leukocytes,Ua: NEGATIVE
Nitrite: NEGATIVE
RBC / HPF: NONE SEEN (ref 0–?)
Specific Gravity, Urine: <=1.005 — AB (ref 1.000–1.030)
Total Protein, Urine: NEGATIVE
Urine Glucose: NEGATIVE
Urobilinogen, UA: 0.2 (ref 0.0–1.0)
pH: 6 (ref 5.0–8.0)

## 2018-11-18 LAB — CBC WITH DIFFERENTIAL/PLATELET
Basophils Absolute: 0.1 10*3/uL (ref 0.0–0.1)
Basophils Relative: 0.7 % (ref 0.0–3.0)
Eosinophils Absolute: 0 10*3/uL (ref 0.0–0.7)
Eosinophils Relative: 0 % (ref 0.0–5.0)
HCT: 40.7 % (ref 36.0–46.0)
Hemoglobin: 13.3 g/dL (ref 12.0–15.0)
Lymphocytes Relative: 40.5 % (ref 12.0–46.0)
Lymphs Abs: 3.1 10*3/uL (ref 0.7–4.0)
MCHC: 32.8 g/dL (ref 30.0–36.0)
MCV: 91.5 fl (ref 78.0–100.0)
Monocytes Absolute: 0.6 10*3/uL (ref 0.1–1.0)
Monocytes Relative: 8.3 % (ref 3.0–12.0)
Neutro Abs: 3.9 10*3/uL (ref 1.4–7.7)
Neutrophils Relative %: 50.5 % (ref 43.0–77.0)
Platelets: 253 10*3/uL (ref 150.0–400.0)
RBC: 4.45 Mil/uL (ref 3.87–5.11)
RDW: 14.1 % (ref 11.5–15.5)
WBC: 7.7 10*3/uL (ref 4.0–10.5)

## 2018-11-18 LAB — IBC PANEL
Iron: 24 ug/dL — ABNORMAL LOW (ref 42–145)
Saturation Ratios: 7.8 % — ABNORMAL LOW (ref 20.0–50.0)
Transferrin: 220 mg/dL (ref 212.0–360.0)

## 2018-11-18 LAB — VITAMIN D 25 HYDROXY (VIT D DEFICIENCY, FRACTURES): VITD: 46.35 ng/mL (ref 30.00–100.00)

## 2018-11-18 LAB — VITAMIN B12: Vitamin B-12: >1500 pg/mL — ABNORMAL HIGH (ref 211–911)

## 2018-11-18 LAB — TSH: TSH: 1.34 u[IU]/mL (ref 0.35–4.50)

## 2018-11-18 LAB — HEMOGLOBIN A1C: Hgb A1c MFr Bld: 5.9 % (ref 4.6–6.5)

## 2018-11-19 ENCOUNTER — Telehealth: Payer: Self-pay | Admitting: Internal Medicine

## 2018-11-19 DIAGNOSIS — E611 Iron deficiency: Secondary | ICD-10-CM

## 2018-11-19 MED ORDER — POLYSACCHARIDE IRON COMPLEX 150 MG PO CAPS: 150.0000 mg | ORAL_CAPSULE | Freq: Every day | ORAL | 1 refills | Status: DC

## 2018-11-20 ENCOUNTER — Telehealth: Payer: Self-pay

## 2018-11-20 NOTE — Telephone Encounter (Signed)
Pt has viewed results via MyChart  

## 2018-11-20 NOTE — Telephone Encounter (Signed)
-----   Message from [name], MD sent at 11/19/2018  3:02 PM EDT ----- Left message on MyChart, pt to cont same tx except  The test results show that your current treatment is OK, except the iron level is low  Please start Nu-iron 1 per day, and we will need to refer you to Gastroenterology to see if there is a concern about internal blood loss.Redmond Baseman to please inform pt, I will do referral and rx

## 2018-11-24 DIAGNOSIS — Z20828 Contact with and (suspected) exposure to other viral communicable diseases: Secondary | ICD-10-CM | POA: Diagnosis not present

## 2018-11-25 ENCOUNTER — Other Ambulatory Visit: Payer: Self-pay | Admitting: Allergy and Immunology

## 2018-11-26 ENCOUNTER — Ambulatory Visit (INDEPENDENT_AMBULATORY_CARE_PROVIDER_SITE_OTHER): Payer: Medicare HMO | Admitting: *Deleted

## 2018-11-26 DIAGNOSIS — J4550 Severe persistent asthma, uncomplicated: Secondary | ICD-10-CM | POA: Diagnosis not present

## 2018-11-26 NOTE — Assessment & Plan Note (Signed)

## 2018-12-03 ENCOUNTER — Telehealth: Payer: Self-pay | Admitting: *Deleted

## 2018-12-03 MED ORDER — DOXYCYCLINE HYCLATE 100 MG PO CAPS: 100.0000 mg | ORAL_CAPSULE | Freq: Two times a day (BID) | ORAL | 0 refills | Status: AC

## 2018-12-03 MED ORDER — PREDNISONE 20 MG PO TABS: 20.0000 mg | ORAL_TABLET | Freq: Every day | ORAL | 0 refills | Status: AC

## 2018-12-03 NOTE — Telephone Encounter (Signed)
Patient called states she is having runny nose, nasal congestion and the mucous is green would like prednisone sent in. States she is using nasal saline rinses and prescription nasal sprays. Please advised

## 2018-12-03 NOTE — Telephone Encounter (Signed)
Spoke to patient reviewed medications we would send in and prescriptions sent in doxycycline 100 mg and prednisone 20 mg sent in. Patient verbalized understanding. Did verify dose with Dr Nelva Bush for doxycycline

## 2018-12-03 NOTE — Telephone Encounter (Signed)
Spoke to patient states she has had symptoms since last time she call 11/06/2018 the color is yellow and is taking all the medications as prescribed including budesonide nasal rinses. Denies fever but is having head pressure at times. Dr Nelva Bush please advise

## 2018-12-03 NOTE — Telephone Encounter (Signed)
Ok so symptoms have been ongoing and not getting better for almost a month now.  Can send in the prednisone as recommended in previous call note but would also recommend an antibiotic at this time for concern for acute sinusitis.  Has she had doxycycline before?  Reviewed allergy list and not listed.  Would send in Doxycycline 165mcg BID x 7 days.   Would take with meals.

## 2018-12-03 NOTE — Telephone Encounter (Signed)
Please ensure using the following maintenance medications:  - Montelukast 10mg  -1 tablet 1 time per day - Flonase 1 spray each nostril twice a day.   - ipratropium nasal spray 2 sprays each nostril up to 3 times a day for nasal drainage control  - nasal saline rinses prior to medicated nasal spray use  How long has she had these symptoms? Any sinus pressure or pain with these symptoms or headaches or fever?   If symptoms have been a week or less and not having any sinus pressure/HA or fever then ok to send in prednisone 20mg  x 5 days.    If symptoms longer than a week and worsening or signs of sinus infection then she may need an antibiotic.

## 2018-12-31 ENCOUNTER — Telehealth: Payer: Self-pay | Admitting: Internal Medicine

## 2018-12-31 NOTE — Telephone Encounter (Signed)
Daughter informed

## 2018-12-31 NOTE — Telephone Encounter (Signed)
Form was faxed and sent to scan last week.

## 2018-12-31 NOTE — Telephone Encounter (Signed)
Pt daughter calling to check on insurance form that was dropped off last week. Please call Taron back at [phone] ext [phone]

## 2019-01-05 ENCOUNTER — Encounter: Payer: Self-pay | Admitting: Gastroenterology

## 2019-01-05 ENCOUNTER — Ambulatory Visit (INDEPENDENT_AMBULATORY_CARE_PROVIDER_SITE_OTHER): Payer: Medicare HMO | Admitting: Gastroenterology

## 2019-01-05 VITALS — BP 146/90 | HR 72 | Temp 98.2°F | Ht 63.75 in | Wt 174.2 lb

## 2019-01-05 DIAGNOSIS — D509 Iron deficiency anemia, unspecified: Secondary | ICD-10-CM | POA: Diagnosis not present

## 2019-01-05 DIAGNOSIS — K5904 Chronic idiopathic constipation: Secondary | ICD-10-CM

## 2019-01-05 DIAGNOSIS — K219 Gastro-esophageal reflux disease without esophagitis: Secondary | ICD-10-CM | POA: Diagnosis not present

## 2019-01-05 MED ORDER — NA SULFATE-K SULFATE-MG SULF 17.5-3.13-1.6 GM/177ML PO SOLN: 1.0000 | Freq: Once | ORAL | 0 refills | Status: AC

## 2019-01-05 NOTE — Patient Instructions (Signed)
Take milk of magnesia every day leading up to Colonoscopy.   You have been scheduled for an endoscopy and colonoscopy. Please follow the written instructions given to you at your visit today. Please pick up your prep supplies at the pharmacy within the next 1-3 days. If you use inhalers (even only as needed), please bring them with you on the day of your procedure.  Thank you for choosing me and [facility].  Pricilla Riffle. Dagoberto Ligas., MD., Marval Regal

## 2019-01-05 NOTE — Progress Notes (Signed)
History of Present Illness: This is a 78 year old female referred by [name], MD for the evaluation of iron deficiency anemia.  She was previously followed by Clarene Essex, MD with a history of GERD and constipation.  Colonoscopy on February 28, 2017 by Dr. Watt Climes was complete to the terminal ileum with an adequate bowel prep.  Diverticulosis and internal hemorrhoids were noted. Her constipation has been challenging to manage.  Linzess 72 mcg daily was not effective so she discontinued it.  Lactulose led to loose stools so she discontinued it.  Currently she is taking milk of magnesia every few days which works well for her.  MiraLAX has not been effective in the past.  Her reflux symptoms are well controlled on daily omeprazole.  No other gastrointestinal complaints. Denies weight loss, abdominal pain, diarrhea, change in stool caliber, melena, hematochezia, nausea, vomiting, dysphagia, chest pain.   CT AP 02/04/2017 IMPRESSION: 1. Moderate amount of retained large bowel stool without bowel obstruction or acute intra-abdominal/pelvic process. 2. 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 Severe L4-5 and L5-S1 neural foraminal narrowing. Aortic Atherosclerosis (ICD10-I70.0).     Allergies  Allergen Reactions  . Asa Buff (Mag [Buffered Aspirin] Other (See Comments)    Excessive sweating  . Fish Allergy Other (See Comments)    Unknown reaction  . Other Other (See Comments)    Gelcaps; gel-containing capsules; extended release tablets  . Peanut-Containing Drug Products Other (See Comments)    From allergy test  . Penicillins Itching    Has patient had a PCN reaction causing immediate rash, facial/tongue/throat swelling, SOB or lightheadedness with hypotension: No Has patient had a PCN reaction causing severe rash involving mucus membranes or skin necrosis: No Has patient had a PCN reaction that required hospitalization No Has patient had a PCN reaction occurring within the last 10 years: No  If all of the above answers are "NO", then may proceed with Cephalosporin use.  Marland Kitchen Shellfish-Derived Products Other (See Comments)    From allergy test  . Strawberry Extract Other (See Comments)    Worsened allergy symptoms   Outpatient Medications Prior to Visit  Medication Sig Dispense Refill  . acetaminophen (TYLENOL) 500 MG tablet Take 500 mg by mouth daily as needed for mild pain, moderate pain, fever or headache. For pain    . albuterol (PROVENTIL) (2.5 MG/3ML) 0.083% nebulizer solution Take 2.5 mg by nebulization 2 (two) times daily as needed for wheezing or shortness of breath.    . AMBULATORY NON FORMULARY MEDICATION Allergy inj Subcutaneous every 8 weeks    . budesonide-formoterol (SYMBICORT) 80-4.5 MCG/ACT inhaler Inhale 2 puffs into the lungs 2 (two) times daily. Use for asthma flares 2 puffs twice a day for 2 weeks or until cough and wheeze free 1 Inhaler 5  . cholecalciferol (VITAMIN D) 1000 units tablet Take 2,000 Units by mouth daily.    Marland Kitchen ipratropium (ATROVENT) 0.06 % nasal spray USE 1 SPRAY(S) IN EACH NOSTRIL THREE TIMES DAILY 15 mL 2  . iron polysaccharides (NU-IRON) 150 MG capsule Take 1 capsule (150 mg total) by mouth daily. 90 capsule 1  . linaclotide (LINZESS) 72 MCG capsule Take 1 capsule (72 mcg total) by mouth daily as needed. 30 capsule 5  . loratadine (CLARITIN) 10 MG tablet Take 10 mg by mouth daily.    Marland Kitchen losartan (COZAAR) 100 MG tablet TAKE 1 TABLET EVERY DAY 90 tablet 3  . Magnesium Hydroxide (PHILLIPS MILK OF MAGNESIA PO) Take 1 Dose by  mouth as needed.    . montelukast (SINGULAIR) 10 MG tablet Take 1 tablet (10 mg total) by mouth at bedtime. 90 tablet 3  . omeprazole (PRILOSEC) 40 MG capsule TAKE 1 CAPSULE DAILY FOR ACID REFLUX 90 capsule 3  . polyethylene glycol powder (GLYCOLAX/MIRALAX) powder Take 17 g by mouth daily.    . vitamin B-12 (CYANOCOBALAMIN) 1000 MCG tablet Take 1,000 mcg by mouth daily.    Marland Kitchen EPINEPHrine (EPIPEN 2-PAK) 0.3 mg/0.3 mL IJ SOAJ  injection Use as directed for severe allergic reaction (Patient not taking: Reported on 01/05/2019) 2 Device 1  . albuterol (PROVENTIL) (2.5 MG/3ML) 0.083% nebulizer solution Take 3 mLs (2.5 mg total) by nebulization 2 (two) times daily as needed for wheezing or shortness of breath. 75 mL 0  . albuterol (VENTOLIN HFA) 108 (90 Base) MCG/ACT inhaler Inhale 2 puffs into the lungs every 4 (four) hours as needed for wheezing or shortness of breath. 1 Inhaler 1  . budesonide-formoterol (SYMBICORT) 160-4.5 MCG/ACT inhaler Inhale 2 puffs into the lungs 2 (two) times daily.    . fluticasone (FLONASE) 50 MCG/ACT nasal spray Place 1 spray into both nostrils 2 (two) times daily. 16 g 3  . hydrochlorothiazide (MICROZIDE) 12.5 MG capsule Take 1 capsule (12.5 mg total) by mouth daily. As needed only for swelling 90 capsule 3  . lactulose (CHRONULAC) 10 GM/15ML solution Take 45 mLs (30 g total) by mouth 2 (two) times daily as needed for mild constipation. 240 mL 02   Facility-Administered Medications Prior to Visit  Medication Dose Route Frequency Provider Last Rate Last Dose  . Benralizumab SOSY 30 mg  30 mg Subcutaneous Q8 Weeks Jiles Prows, MD   30 mg at 11/26/18 0930   Past Medical History:  Diagnosis Date  . Arthritis   . Asthma   . Bowel obstruction ([place])   . Environmental allergies   . GERD (gastroesophageal reflux disease)   . Glaucoma 05/09/2017  . H. pylori infection   . Hypertension   . Iron deficiency anemia   . Osteopenia 05/26/2017  . Urticaria 07/25/2018   Past Surgical History:  Procedure Laterality Date  . ABDOMINAL HYSTERECTOMY    . BREAST BIOPSY    . COLON SURGERY    . KNEE SURGERY    . LAPAROTOMY N/A 04/18/2015   Procedure: Exploratory laparotomy with lysis of adhesions, possible bowel resection;  Surgeon: Ralene Ok, MD;  Location: [place] [facility];  Service: General;  Laterality: N/A;  . NASAL SINUS SURGERY    . THYROID SURGERY     Social History   Socioeconomic History  .  Marital status: Widowed    Spouse name: Not on file  . Number of children: 4  . Years of education: Not on file  . Highest education level: Not on file  Occupational History  . Occupation: retired  Scientific laboratory technician  . Financial resource strain: Not on file  . Food insecurity    Worry: Not on file    Inability: Not on file  . Transportation needs    Medical: Not on file    Non-medical: Not on file  Tobacco Use  . Smoking status: Former Smoker    Types: Cigarettes  . Smokeless tobacco: Never Used  Substance and Sexual Activity  . Alcohol use: No  . Drug use: No  . Sexual activity: Never  Lifestyle  . Physical activity    Days per week: Not on file    Minutes per session: Not on file  . Stress:  Not on file  Relationships  . Social Herbalist on phone: Not on file    Gets together: Not on file    Attends religious service: Not on file    Active member of club or organization: Not on file    Attends meetings of clubs or organizations: Not on file    Relationship status: Not on file  Other Topics Concern  . Not on file  Social History Narrative  . Not on file   Family History  Problem Relation Age of Onset  . Diabetes Mother   . Hypertension Mother   . Hypertension Father   . Glaucoma Sister   . Glaucoma Brother   . Allergic rhinitis Neg Hx   . Angioedema Neg Hx   . Asthma Neg Hx   . Eczema Neg Hx   . Immunodeficiency Neg Hx   . Urticaria Neg Hx        Review of Systems: Pertinent positive and negative review of systems were noted in the above HPI section. All other review of systems were otherwise negative.    Physical Exam: General: Well developed, well nourished, no acute distress Head: Normocephalic and atraumatic Eyes:  sclerae anicteric, EOMI Ears: Normal auditory acuity Mouth: No deformity or lesions Neck: Supple, no masses or thyromegaly Lungs: Clear throughout to auscultation Heart: Regular rate and rhythm; no murmurs, rubs or bruits  Abdomen: Soft, non tender and non distended. No masses, hepatosplenomegaly or hernias noted. Normal Bowel sounds Rectal: Deferred to colonoscopy Musculoskeletal: Symmetrical with no gross deformities  Skin: No lesions on visible extremities Pulses:  Normal pulses noted Extremities: No clubbing, cyanosis, edema or deformities noted Neurological: Alert oriented x 4, grossly nonfocal Cervical Nodes:  No significant cervical adenopathy Inguinal Nodes: No significant inguinal adenopathy Psychological:  Alert and cooperative. Normal mood and affect   Assessment and Recommendations:  1.  Iron deficiency anemia.  Rule out AVMs, colorectal neoplasms, UGI tract neoplasms, ulcers, erosions.  Schedule colonoscopy and EGD. Hold iron for 5 days prior. MOM daily for 5 days prior. The risks (including bleeding, perforation, infection, missed lesions, medication reactions and possible hospitalization or surgery if complications occur), benefits, and alternatives to colonoscopy with possible biopsy and possible polypectomy were discussed with the patient and they consent to proceed. The risks (including bleeding, perforation, infection, missed lesions, medication reactions and possible hospitalization or surgery if complications occur), benefits, and alternatives to endoscopy with possible biopsy and possible dilation were discussed with the patient and they consent to proceed.   2.  Chronic constipation currently controlled with MOM qd prn.  She is encouraged to take a scheduled dose daily, every other day or every third day to improve management.  3.  GERD.  Continue omeprazole 40 mg daily and follow standard antireflux measures.  Further evaluation with EGD.   cc: [name], MD [address] [place],  [place] [address]

## 2019-01-06 ENCOUNTER — Other Ambulatory Visit: Payer: Self-pay

## 2019-01-06 ENCOUNTER — Ambulatory Visit
Admission: RE | Admit: 2019-01-06 | Discharge: 2019-01-06 | Disposition: A | Discharge status: Home / Self Care | Payer: Medicare HMO | Source: Ambulatory Visit | Attending: Internal Medicine | Admitting: Internal Medicine

## 2019-01-06 DIAGNOSIS — Z1231 Encounter for screening mammogram for malignant neoplasm of breast: Secondary | ICD-10-CM

## 2019-01-06 IMAGING — MG DIGITAL SCREENING BILATERAL MAMMOGRAM WITH TOMO AND CAD
8 series · 8 of 24 positions shown · non-contrast
Comparison: Previous exam(s).

CLINICAL DATA: Screening.

EXAM:
DIGITAL SCREENING BILATERAL MAMMOGRAM WITH TOMO AND CAD

[R MLO synth-2D]
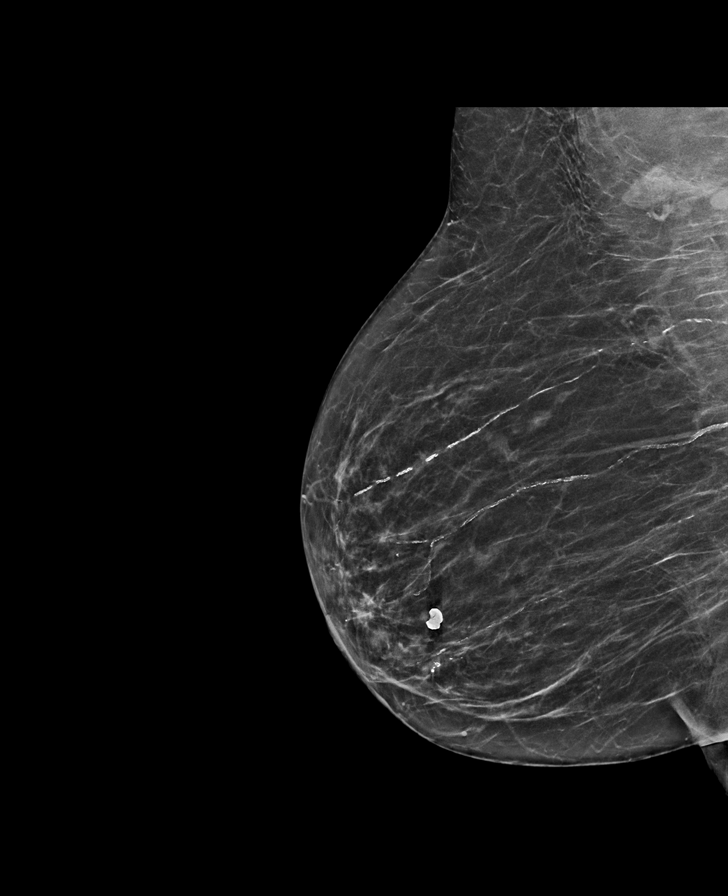

[L MLO synth-2D]
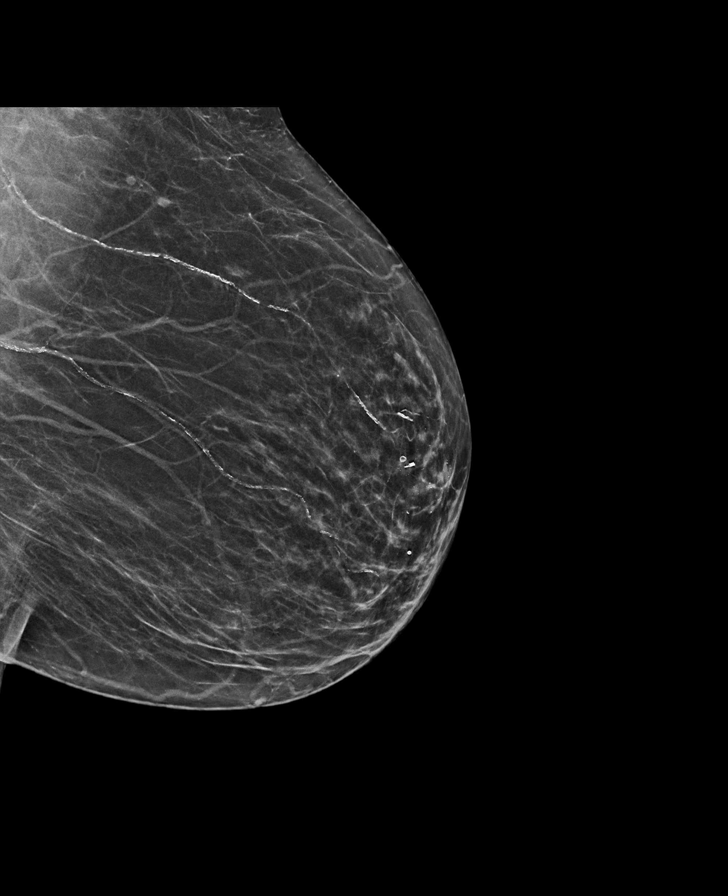

[R CC synth-2D]
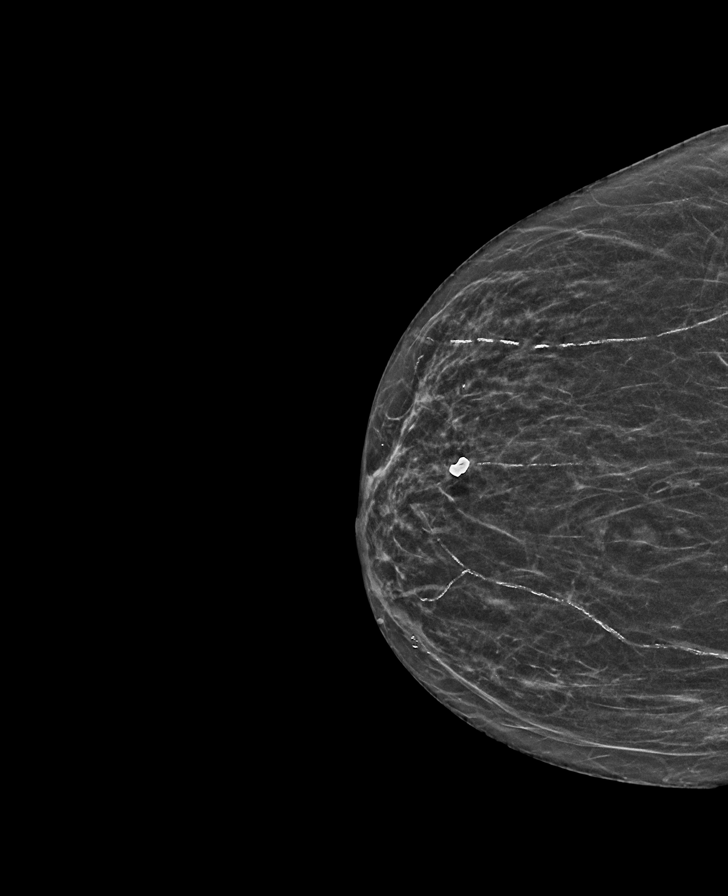

[L CC synth-2D]
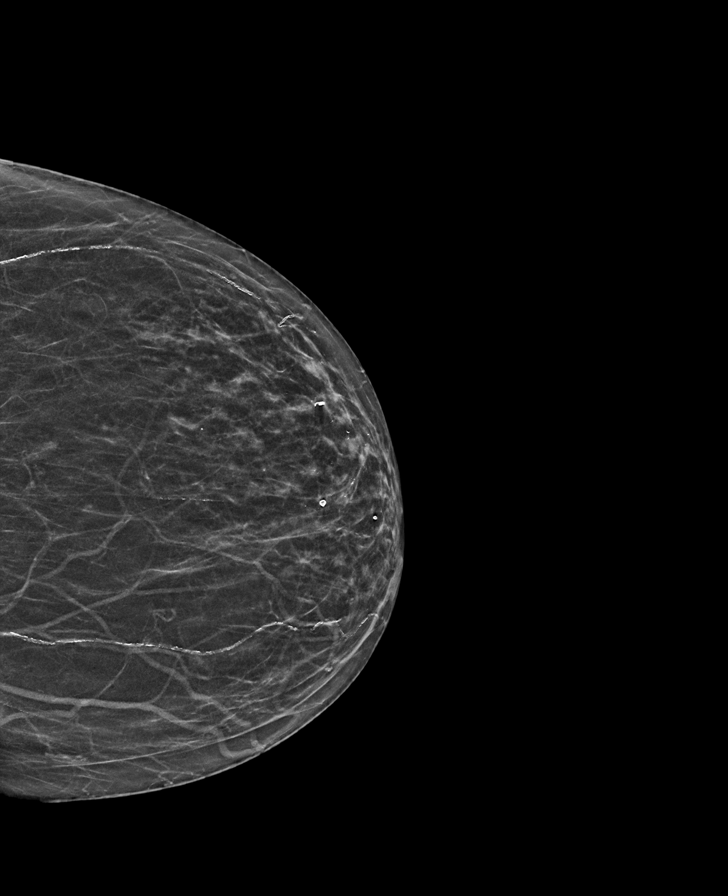

[L MLO tomo · tomo slice 31/60.0]
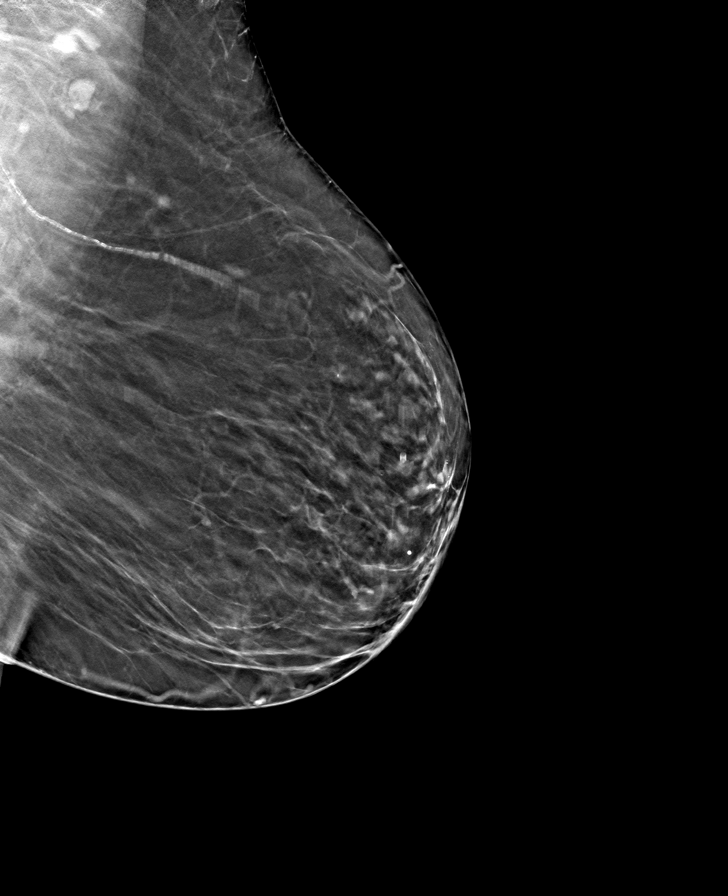

[L CC tomo · tomo slice 25/48.0]
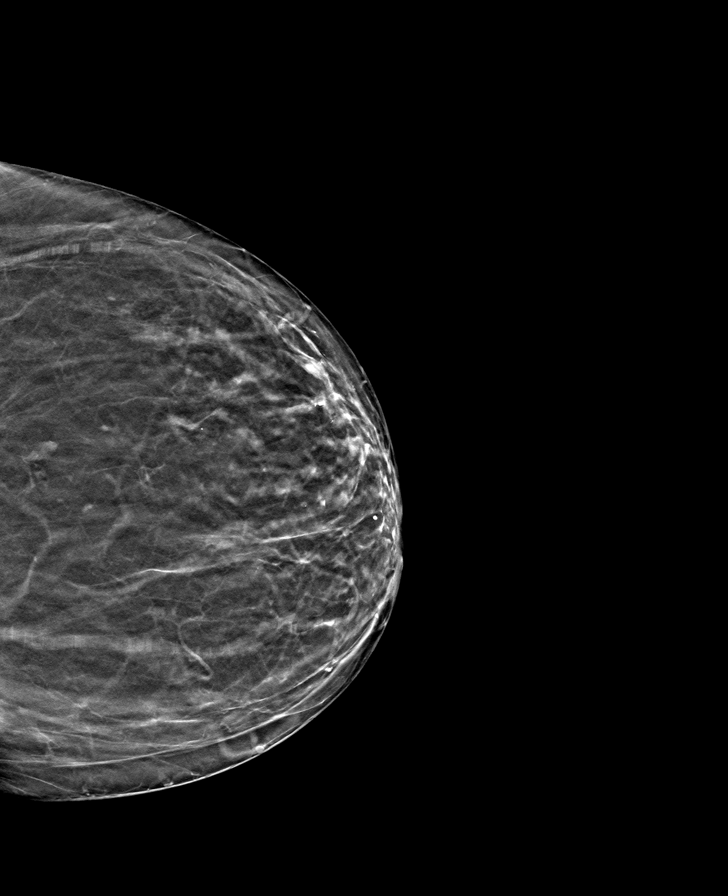

[R MLO tomo · tomo slice 31/61.0]
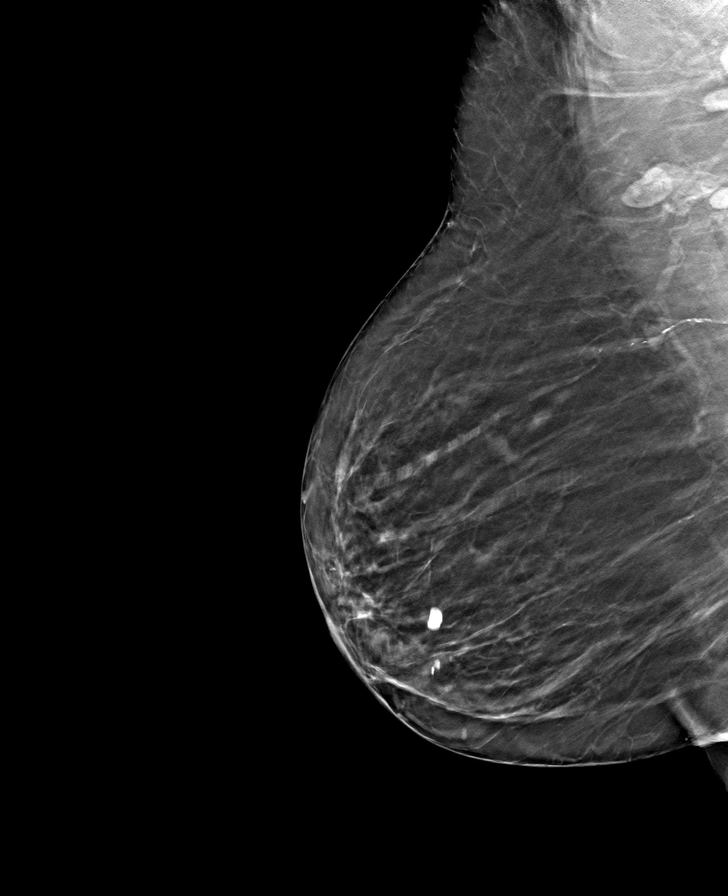

[R CC tomo · tomo slice 25/48.0]
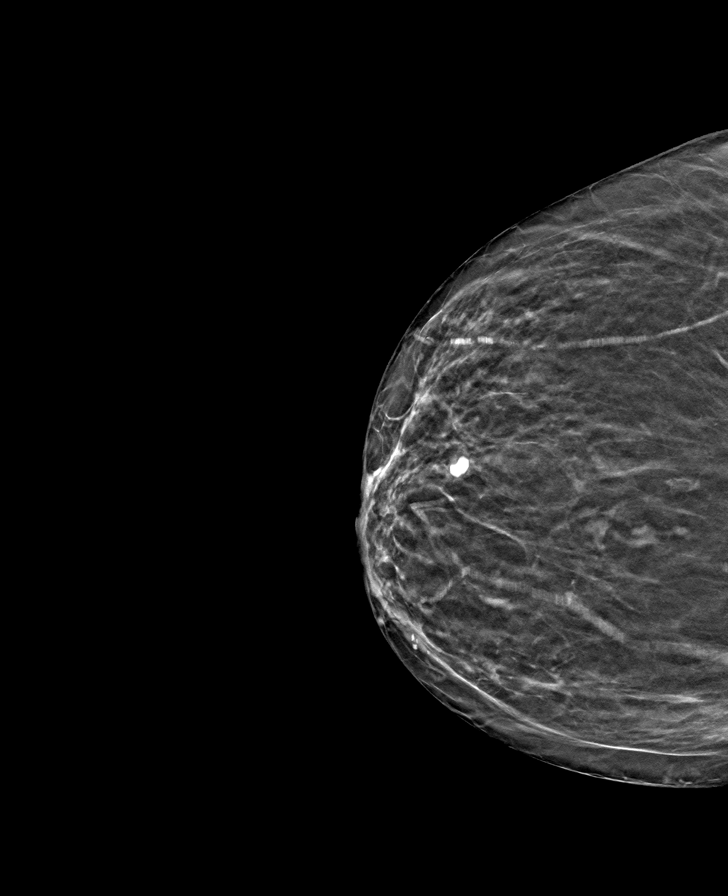

[8 of 24 positions shown; findings below may reference images not displayed]

ACR Breast Density Category b: There are scattered areas of
fibroglandular density.
FINDINGS: There are no findings suspicious for malignancy. Images were
processed with CAD.
IMPRESSION: No mammographic evidence of malignancy. A result letter of this
screening mammogram will be mailed directly to the patient.

RECOMMENDATION:
Screening mammogram in one year. (Code:[TQ])

BI-RADS CATEGORY  1: Negative.

## 2019-01-07 ENCOUNTER — Telehealth: Payer: Self-pay | Admitting: Allergy

## 2019-01-07 NOTE — Telephone Encounter (Signed)
Pt called and needs a sample of [phone]

## 2019-01-07 NOTE — Telephone Encounter (Signed)
Sample placed up front for pick up and patient has been notified.

## 2019-01-08 ENCOUNTER — Ambulatory Visit: Payer: Medicare HMO | Admitting: Internal Medicine

## 2019-01-08 DIAGNOSIS — H40023 Open angle with borderline findings, high risk, bilateral: Secondary | ICD-10-CM | POA: Diagnosis not present

## 2019-01-08 DIAGNOSIS — H1045 Other chronic allergic conjunctivitis: Secondary | ICD-10-CM | POA: Diagnosis not present

## 2019-01-08 DIAGNOSIS — H04123 Dry eye syndrome of bilateral lacrimal glands: Secondary | ICD-10-CM | POA: Diagnosis not present

## 2019-01-14 ENCOUNTER — Encounter (HOSPITAL_COMMUNITY): Payer: Self-pay | Admitting: *Deleted

## 2019-01-14 ENCOUNTER — Emergency Department (HOSPITAL_COMMUNITY)
Admission: EM | Admit: 2019-01-14 | Discharge: 2019-01-14 | Disposition: A | Discharge status: Home / Self Care | Payer: Medicare HMO | Attending: Emergency Medicine | Admitting: Emergency Medicine

## 2019-01-14 ENCOUNTER — Emergency Department (HOSPITAL_COMMUNITY): Payer: Medicare HMO

## 2019-01-14 ENCOUNTER — Other Ambulatory Visit: Payer: Self-pay

## 2019-01-14 DIAGNOSIS — Z87891 Personal history of nicotine dependence: Secondary | ICD-10-CM | POA: Insufficient documentation

## 2019-01-14 DIAGNOSIS — Y999 Unspecified external cause status: Secondary | ICD-10-CM | POA: Insufficient documentation

## 2019-01-14 DIAGNOSIS — S7002XA Contusion of left hip, initial encounter: Secondary | ICD-10-CM | POA: Insufficient documentation

## 2019-01-14 DIAGNOSIS — Z79899 Other long term (current) drug therapy: Secondary | ICD-10-CM | POA: Insufficient documentation

## 2019-01-14 DIAGNOSIS — I10 Essential (primary) hypertension: Secondary | ICD-10-CM | POA: Insufficient documentation

## 2019-01-14 DIAGNOSIS — W208XXA Other cause of strike by thrown, projected or falling object, initial encounter: Secondary | ICD-10-CM | POA: Insufficient documentation

## 2019-01-14 DIAGNOSIS — Y93E9 Activity, other interior property and clothing maintenance: Secondary | ICD-10-CM | POA: Diagnosis not present

## 2019-01-14 DIAGNOSIS — Y929 Unspecified place or not applicable: Secondary | ICD-10-CM | POA: Insufficient documentation

## 2019-01-14 DIAGNOSIS — M25552 Pain in left hip: Secondary | ICD-10-CM | POA: Diagnosis not present

## 2019-01-14 DIAGNOSIS — Z91010 Allergy to peanuts: Secondary | ICD-10-CM | POA: Diagnosis not present

## 2019-01-14 DIAGNOSIS — Z8709 Personal history of other diseases of the respiratory system: Secondary | ICD-10-CM | POA: Diagnosis not present

## 2019-01-14 DIAGNOSIS — S79912A Unspecified injury of left hip, initial encounter: Secondary | ICD-10-CM | POA: Diagnosis present

## 2019-01-14 IMAGING — DX DG HIP (WITH OR WITHOUT PELVIS) 2-3V LEFT
3 series · 3 of 3 positions shown · non-contrast
Comparison: CT abdomen pelvis [DATE]

CLINICAL DATA: Left hip strike, left hip pain

EXAM:
DG HIP (WITH OR WITHOUT PELVIS) 2-3V LEFT

[pelvis ap]
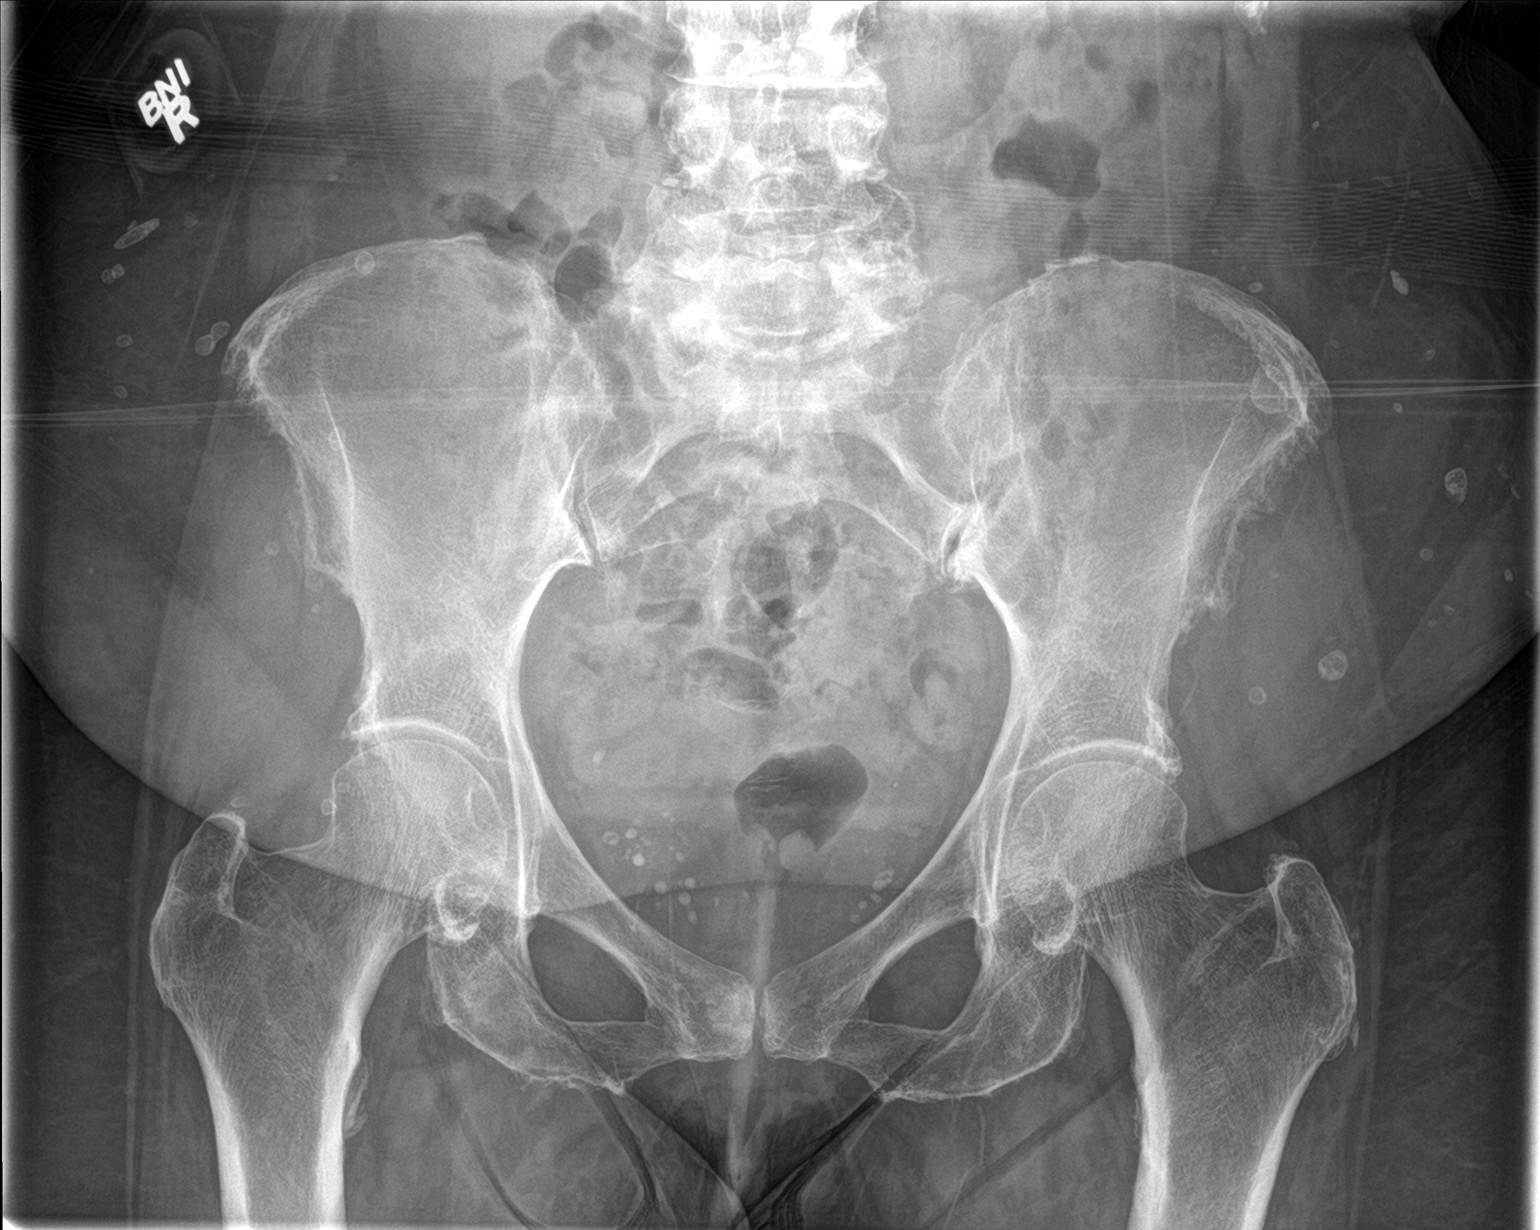

[hip ap]
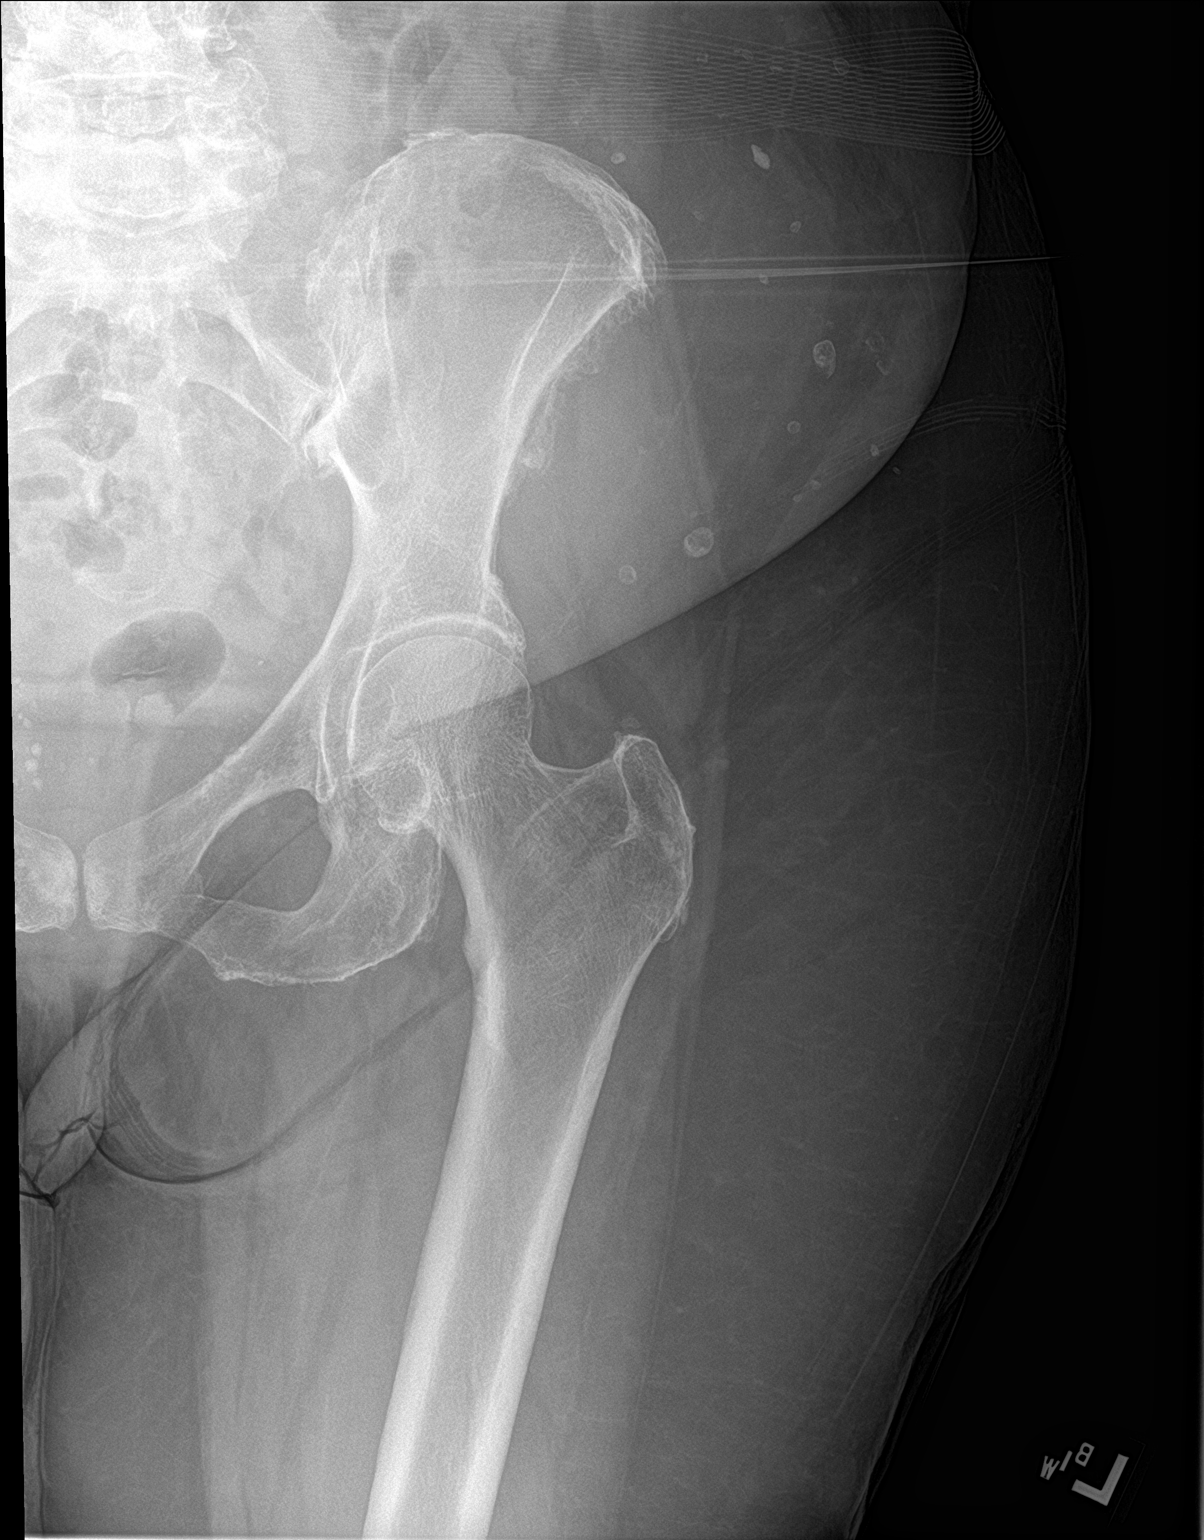

[hip lat]
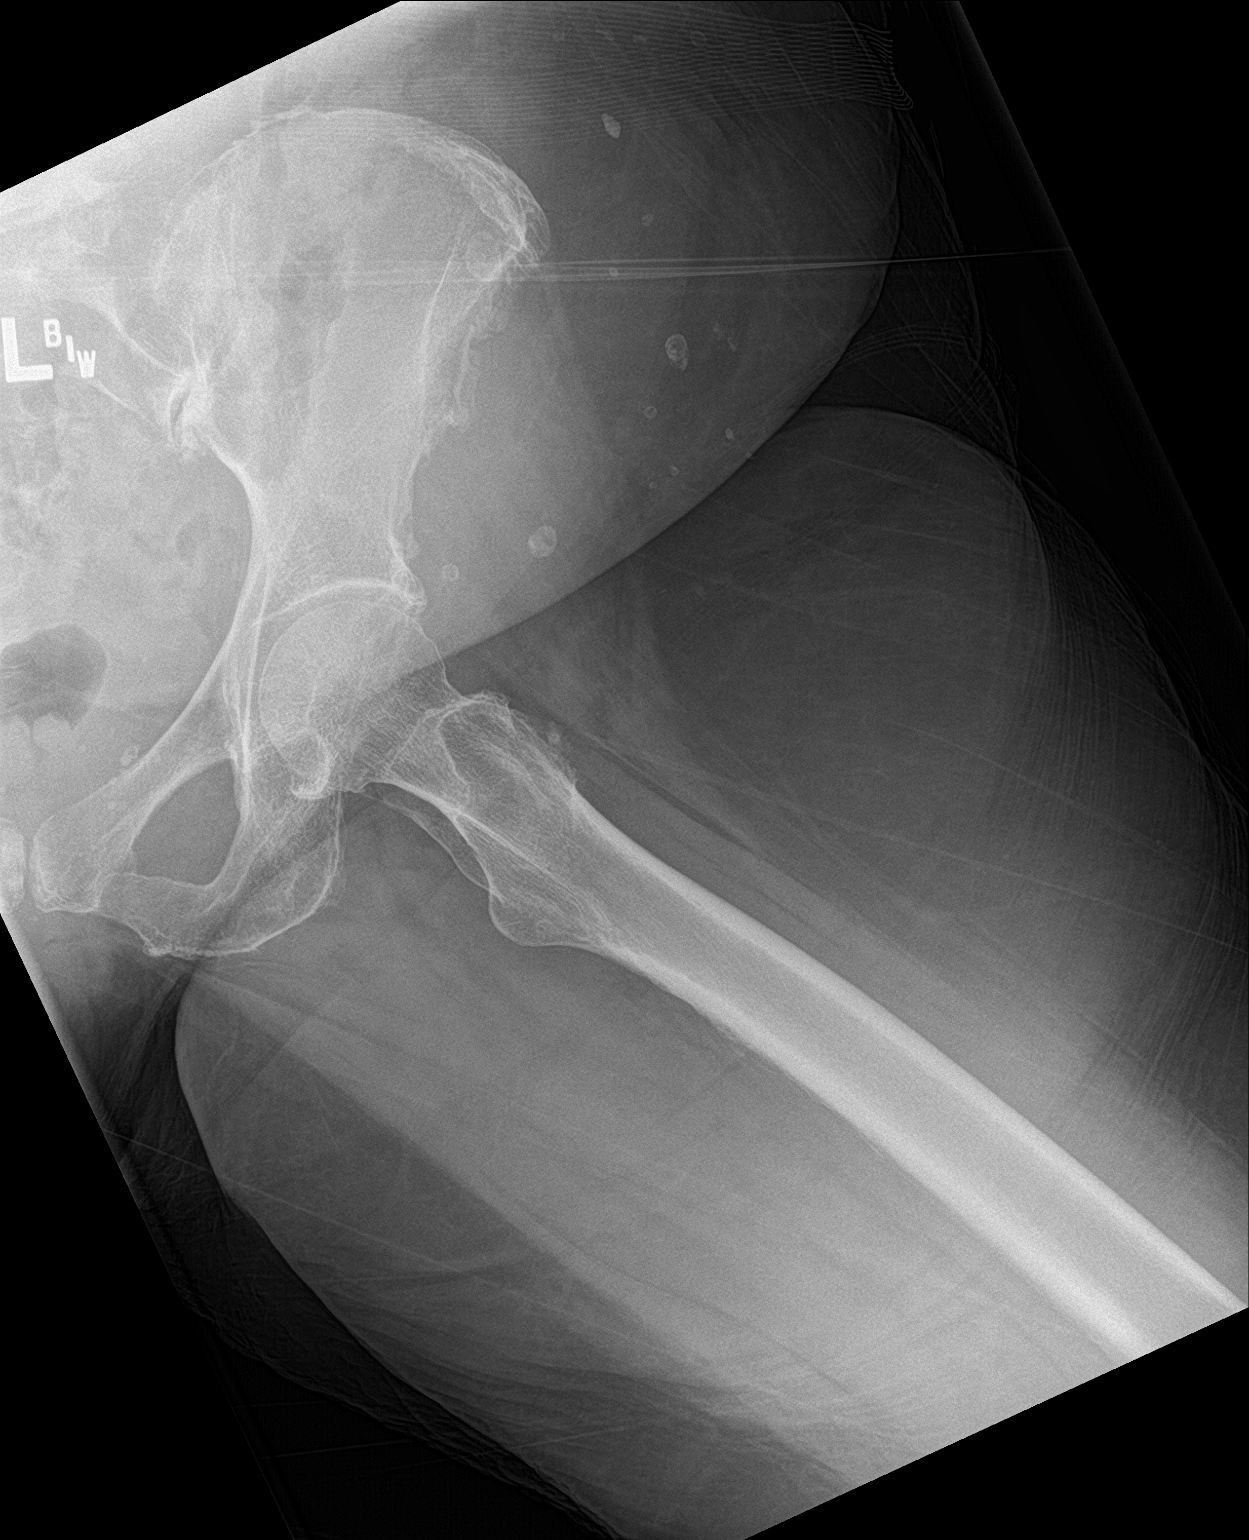

[3 of 3 positions shown; findings below may reference images not displayed]

FINDINGS: No evidence of acute hip fracture or dislocation. Extensive
degenerative changes are present in the included portions of the
lumbar spine, SI joints and symphysis pubis. Mild degenerative
changes are present in both hips. Enthesopathic changes are noted on
the iliac crests, ischial tuberosities and greater and lesser
trochanters of both femora. Portions of the sacrum are incompletely
evaluated due to overlying bowel gas. Numerous soft tissue
mineralization is overlying the flanks are compatible with injection
granulomata seen on prior CT. Scattered calcified pelvic phleboliths
are noted in the deep pelvis. The bowel gas pattern is
nonobstructive. Remaining soft tissues are unremarkable.
IMPRESSION: 1. No acute osseous abnormality.
2. Extensive degenerative changes in the included portions of the
lumbar spine, SI joints and symphysis pubis.

## 2019-01-14 NOTE — ED Triage Notes (Signed)
Pt was putting something in the trash and the large lid on the trash can fell back and hit her L hip.  She felt like her leg "broke in 2" and dragged her foot into the house, however she was later able to ambulate on it.

## 2019-01-14 NOTE — ED Notes (Signed)
Pt discharged from ED; instructions provided; Pt encouraged to return to ED if symptoms worsen and to f/u with PCP; Pt verbalized understanding of all instructions 

## 2019-01-15 NOTE — ED Provider Notes (Signed)
[facility] EMERGENCY DEPARTMENT Provider Note   CSN: [id number] Arrival date & time: 01/14/19  1636     History   Chief Complaint Chief Complaint  Patient presents with  . Hip Injury    HPI Angelica [name] is a 78 y.o. female.     78 year old female with past medical history below who presents with left hip injury.  This morning, she was taking out her trash and when she tried to put a bag of trash, the large lid on the trashcan slid and knocked the bag into her left lateral hip.  She denies falling on her hip but she immediately had pain in her hip and was concerned that she may have broken something.  Initially she had difficulty ambulating but later has been able to bear weight on her left leg.  Her pain is currently mild to moderate.  She thinks that it might be getting better.  She denies any other areas of injury.  No previous problems with her hip. She took 1 tylenol earlier today.  The history is provided by the patient.    Past Medical History:  Diagnosis Date  . Arthritis   . Asthma   . Bowel obstruction ([place])   . Environmental allergies   . GERD (gastroesophageal reflux disease)   . Glaucoma 05/09/2017  . H. pylori infection   . Hypertension   . Iron deficiency anemia   . Osteopenia 05/26/2017  . Urticaria 07/25/2018    Patient Active Problem List   Diagnosis Date Noted  . 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 Urticaria 07/25/2018  . Low back pain 07/09/2018  . Hyperglycemia 07/09/2018  . Rash 10/10/2017  . Constipation 08/19/2017  . Hypokalemia 07/09/2017  . Asthma, severe persistent, well-controlled 06/04/2017  . 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 Nasal polyposis 06/04/2017  . Leukocytosis 06/04/2017  . Osteopenia 05/26/2017  . Encounter for well adult exam with abnormal findings 05/09/2017  . Glaucoma 05/09/2017  . GERD (gastroesophageal reflux disease)   . H. pylori infection   . Other allergic rhinitis  03/20/2016  . Cough, persistent 03/20/2016  . Hypoglycemia 04/17/2015  . Malnutrition of moderate degree 04/11/2015  . SBO (small bowel obstruction) ([place]) 04/10/2015  . HTN (hypertension) 04/10/2015    Past Surgical History:  Procedure Laterality Date  . ABDOMINAL HYSTERECTOMY    . BREAST BIOPSY    . COLON SURGERY    . KNEE SURGERY    . LAPAROTOMY N/A 04/18/2015   Procedure: Exploratory laparotomy with lysis of adhesions, possible bowel resection;  Surgeon: Ralene Ok, MD;  Location: [facility];  Service: General;  Laterality: N/A;  . NASAL SINUS SURGERY    . THYROID SURGERY       OB History   No obstetric history on file.      Home Medications    Prior to Admission medications   Medication Sig Start Date End Date Taking? Authorizing Provider  acetaminophen (TYLENOL) 500 MG tablet Take 500 mg by mouth daily as needed for mild pain, moderate pain, fever or headache. For pain    [provider]  albuterol (PROVENTIL) (2.5 MG/3ML) 0.083% nebulizer solution Take 2.5 mg by nebulization 2 (two) times daily as needed for wheezing or shortness of breath.    [provider]  AMBULATORY NON FORMULARY MEDICATION Allergy inj Subcutaneous every 8 weeks    [provider]  budesonide-formoterol (SYMBICORT) 80-4.5 MCG/ACT inhaler Inhale 2 puffs into the lungs 2 (two) times daily. Use for  asthma flares 2 puffs twice a day for 2 weeks or until cough and wheeze free 07/25/18   Ambs, Kathrine Cords, FNP  cholecalciferol (VITAMIN D) 1000 units tablet Take 2,000 Units by mouth daily.    [provider]  EPINEPHrine (EPIPEN 2-PAK) 0.3 mg/0.3 mL IJ SOAJ injection Use as directed for severe allergic reaction Patient not taking: Reported on 01/05/2019 06/17/17   Bobbitt, Sedalia Muta, MD  ipratropium (ATROVENT) 0.06 % nasal spray USE 1 SPRAY(S) IN EACH NOSTRIL THREE TIMES DAILY 11/25/18   Bobbitt, Sedalia Muta, MD  iron polysaccharides (NU-IRON) 150 MG capsule Take 1 capsule (150  mg total) by mouth daily. 11/19/18   [name], MD  linaclotide [facility]) 72 MCG capsule Take 1 capsule (72 mcg total) by mouth daily as needed. 11/14/18   [name], MD  loratadine (CLARITIN) 10 MG tablet Take 10 mg by mouth daily.    [provider]  losartan (COZAAR) 100 MG tablet TAKE 1 TABLET EVERY DAY 06/30/18   [name], MD  Magnesium Hydroxide (PHILLIPS MILK OF MAGNESIA PO) Take 1 Dose by mouth as needed.    [provider]  montelukast (SINGULAIR) 10 MG tablet Take 1 tablet (10 mg total) by mouth at bedtime. 07/09/18   [name], MD  omeprazole (PRILOSEC) 40 MG capsule TAKE 1 CAPSULE DAILY FOR ACID REFLUX 11/05/18   [name], MD  polyethylene glycol powder (GLYCOLAX/MIRALAX) powder Take 17 g by mouth daily.    [provider]  vitamin B-12 (CYANOCOBALAMIN) 1000 MCG tablet Take 1,000 mcg by mouth daily.    [provider]    Family History Family History  Problem Relation Age of Onset  . Diabetes Mother   . Hypertension Mother   . Hypertension Father   . Glaucoma Sister   . Glaucoma Brother   . Allergic rhinitis Neg Hx   . Angioedema Neg Hx   . Asthma Neg Hx   . Eczema Neg Hx   . Immunodeficiency Neg Hx   . Urticaria Neg Hx     Social History Social History   Tobacco Use  . Smoking status: Former Smoker    Types: Cigarettes  . Smokeless tobacco: Never Used  Substance Use Topics  . Alcohol use: No  . Drug use: No     Allergies   Asa buff (mag [buffered aspirin], Fish allergy, Other, Peanut-containing drug products, Penicillins, Shellfish-derived products, and Strawberry extract   Review of Systems Review of Systems All other systems reviewed and are negative except that which was mentioned in HPI   Physical Exam Updated Vital Signs BP (!) 159/73 (BP Location: Right Arm)   Pulse 77   Temp 99 F (37.2 C) (Oral)   Resp 18   Ht 5' 3.75" (1.619 m)   Wt 79 kg   BMI 30.13 kg/m   Physical Exam Vitals  signs and nursing note reviewed.  Constitutional:      General: She is not in acute distress.    Appearance: She is well-developed.  HENT:     Head: Normocephalic and atraumatic.  Eyes:     Conjunctiva/sclera: Conjunctivae normal.  Neck:     Musculoskeletal: Neck supple.  Musculoskeletal: Normal range of motion.        General: No swelling or deformity.     Comments: Mild tenderness lateral L hip near buttock without deformity or ecchymosis  Skin:    General: Skin is warm and dry.  Neurological:  Mental Status: She is alert and oriented to person, place, and time.     Gait: Gait normal.  Psychiatric:        Judgment: Judgment normal.      ED Treatments / Results  Labs (all labs ordered are listed, but only abnormal results are displayed) Labs Reviewed - No data to display  EKG None  Radiology Dg Hip Unilat With Pelvis 2-3 Views Left  Result Date: 01/14/2019 CLINICAL DATA:  Left hip strike, left hip pain EXAM: DG HIP (WITH OR WITHOUT PELVIS) 2-3V LEFT COMPARISON:  CT abdomen pelvis 02/04/2017 FINDINGS: No evidence of acute hip fracture or dislocation. Extensive degenerative changes are present in the included portions of the lumbar spine, SI joints and symphysis pubis. Mild degenerative changes are present in both hips. Enthesopathic changes are noted on the iliac crests, ischial tuberosities and greater and lesser trochanters of both femora. Portions of the sacrum are incompletely evaluated due to overlying bowel gas. Numerous soft tissue mineralization is overlying the flanks are compatible with injection granulomata seen on prior CT. Scattered calcified pelvic phleboliths are noted in the deep pelvis. The bowel gas pattern is nonobstructive. Remaining soft tissues are unremarkable. IMPRESSION: 1. No acute osseous abnormality. 2. Extensive degenerative changes in the included portions of the lumbar spine, SI joints and symphysis pubis. Electronically Signed   By: Lovena Le  M.D.   On: 01/14/2019 18:07    Procedures Procedures (including critical care time)  Medications Ordered in ED Medications - No data to display   Initial Impression / Assessment and Plan / ED Course  I have reviewed the triage vital signs and the nursing notes.  Pertinent imaging results that were available during my care of the patient were reviewed by me and considered in my medical decision making (see chart for details).       Plain films negative for acute injury.  Patient was able to ambulate with me and reported that symptoms may be improving.  Instructed to follow-up with PCP if pain not improved.  She voiced understanding.  Final Clinical Impressions(s) / ED Diagnoses   Final diagnoses:  Contusion of left hip, initial encounter    ED Discharge Orders    None       Elan Brainerd, Wenda Overland, MD 01/15/19 [phone]

## 2019-01-16 ENCOUNTER — Other Ambulatory Visit: Payer: Self-pay

## 2019-01-16 ENCOUNTER — Telehealth: Payer: Self-pay | Admitting: Internal Medicine

## 2019-01-16 ENCOUNTER — Ambulatory Visit (INDEPENDENT_AMBULATORY_CARE_PROVIDER_SITE_OTHER): Payer: Medicare HMO | Admitting: Internal Medicine

## 2019-01-16 ENCOUNTER — Encounter: Payer: Self-pay | Admitting: Internal Medicine

## 2019-01-16 VITALS — BP 136/86 | HR 75 | Temp 98.5°F | Ht 63.75 in | Wt 171.0 lb

## 2019-01-16 DIAGNOSIS — I10 Essential (primary) hypertension: Secondary | ICD-10-CM | POA: Diagnosis not present

## 2019-01-16 DIAGNOSIS — E785 Hyperlipidemia, unspecified: Secondary | ICD-10-CM | POA: Diagnosis not present

## 2019-01-16 DIAGNOSIS — Z23 Encounter for immunization: Secondary | ICD-10-CM

## 2019-01-16 DIAGNOSIS — M67471 Ganglion, right ankle and foot: Secondary | ICD-10-CM | POA: Diagnosis not present

## 2019-01-16 HISTORY — DX: Hyperlipidemia, unspecified: E78.5

## 2019-01-16 NOTE — Patient Instructions (Signed)
You will be contacted regarding the referral for: Dr Tamala Julian- Sports Medicine for a probable Ganglion Cyst  Please continue all other medications as before, and refills have been done if requested.  Please have the pharmacy call with any other refills you may need.  Please continue your efforts at being more active, low cholesterol diet, and weight control.  Please keep your appointments with your specialists as you may have planned

## 2019-01-16 NOTE — Telephone Encounter (Signed)
Patient is scheduled for end of September.  Would like to know if she could get in sooner.  Please follow up with daughter in regard.   Thanks!

## 2019-01-18 ENCOUNTER — Encounter: Payer: Self-pay | Admitting: Internal Medicine

## 2019-01-18 NOTE — Assessment & Plan Note (Signed)
stable overall by history and exam, recent data reviewed with pt, and pt to continue medical treatment as before,  to f/u any worsening symptoms or concerns  

## 2019-01-18 NOTE — Assessment & Plan Note (Signed)
Mild to mod, for referral Sports Med for possible cortisone

## 2019-01-18 NOTE — Progress Notes (Signed)
Subjective:    Patient ID: Angelica [name], female    DOB: November 03, 1940, 78 y.o.   MRN: [id number]  HPI   Here with daughter with c/o 3 wks onset mild to mod gradually worsening tender painful local area to lateral aspect near the right ankle, worse to walk, better to sit, not sure how it seemed start in the first place.  Pt denies chest pain, increased sob or doe, wheezing, orthopnea, PND, increased LE swelling, palpitations, dizziness or syncope.  Pt denies new neurological symptoms such as new headache, or facial or extremity weakness or numbness   Pt denies polydipsia, polyuria.  .Dementia overall stable symptomatically and not assoc with behavioral changes such as hallucinations, paranoia, or agitation. Past Medical History:  Diagnosis Date  . Arthritis   . Asthma   . Bowel obstruction ([place])   . Environmental allergies   . GERD (gastroesophageal reflux disease)   . Glaucoma 05/09/2017  . H. pylori infection   . HLD (hyperlipidemia) 01/16/2019  . Hypertension   . Iron deficiency anemia   . Osteopenia 05/26/2017  . Urticaria 07/25/2018   Past Surgical History:  Procedure Laterality Date  . ABDOMINAL HYSTERECTOMY    . BREAST BIOPSY    . COLON SURGERY    . KNEE SURGERY    . LAPAROTOMY N/A 04/18/2015   Procedure: Exploratory laparotomy with lysis of adhesions, possible bowel resection;  Surgeon: Ralene Ok, MD;  Location: [facility];  Service: General;  Laterality: N/A;  . NASAL SINUS SURGERY    . THYROID SURGERY      reports that she has quit smoking. Her smoking use included cigarettes. She has never used smokeless tobacco. She reports that she does not drink alcohol or use drugs. family history includes Diabetes in her mother; Glaucoma in her brother and sister; Hypertension in her father and mother. Allergies  Allergen Reactions  . Asa Buff (Mag [Buffered Aspirin] Other (See Comments)    Excessive sweating  . Fish Allergy Other (See Comments)    Unknown reaction  . Other Other  (See Comments)    Gelcaps; gel-containing capsules; extended release tablets  . Peanut-Containing Drug Products Other (See Comments)    From allergy test  . Penicillins Itching    Has patient had a PCN reaction causing immediate rash, facial/tongue/throat swelling, SOB or lightheadedness with hypotension: No Has patient had a PCN reaction causing severe rash involving mucus membranes or skin necrosis: No Has patient had a PCN reaction that required hospitalization No Has patient had a PCN reaction occurring within the last 10 years: No If all of the above answers are "NO", then may proceed with Cephalosporin use.  Marland Kitchen Shellfish-Derived Products Other (See Comments)    From allergy test  . Strawberry Extract Other (See Comments)    Worsened allergy symptoms   Current Outpatient Medications on File Prior to Visit  Medication Sig Dispense Refill  . acetaminophen (TYLENOL) 500 MG tablet Take 500 mg by mouth daily as needed for mild pain, moderate pain, fever or headache. For pain    . albuterol (PROVENTIL) (2.5 MG/3ML) 0.083% nebulizer solution Take 2.5 mg by nebulization 2 (two) times daily as needed for wheezing or shortness of breath.    . AMBULATORY NON FORMULARY MEDICATION Allergy inj Subcutaneous every 8 weeks    . budesonide-formoterol (SYMBICORT) 80-4.5 MCG/ACT inhaler Inhale 2 puffs into the lungs 2 (two) times daily. Use for asthma flares 2 puffs twice a day for 2 weeks or until cough and wheeze  free 1 Inhaler 5  . cholecalciferol (VITAMIN D) 1000 units tablet Take 2,000 Units by mouth daily.    Marland Kitchen EPINEPHrine (EPIPEN 2-PAK) 0.3 mg/0.3 mL IJ SOAJ injection Use as directed for severe allergic reaction 2 Device 1  . ipratropium (ATROVENT) 0.06 % nasal spray USE 1 SPRAY(S) IN EACH NOSTRIL THREE TIMES DAILY 15 mL 2  . iron polysaccharides (NU-IRON) 150 MG capsule Take 1 capsule (150 mg total) by mouth daily. 90 capsule 1  . linaclotide (LINZESS) 72 MCG capsule Take 1 capsule (72 mcg total)  by mouth daily as needed. 30 capsule 5  . loratadine (CLARITIN) 10 MG tablet Take 10 mg by mouth daily.    Marland Kitchen losartan (COZAAR) 100 MG tablet TAKE 1 TABLET EVERY DAY 90 tablet 3  . Magnesium Hydroxide (PHILLIPS MILK OF MAGNESIA PO) Take 1 Dose by mouth as needed.    . montelukast (SINGULAIR) 10 MG tablet Take 1 tablet (10 mg total) by mouth at bedtime. 90 tablet 3  . omeprazole (PRILOSEC) 40 MG capsule TAKE 1 CAPSULE DAILY FOR ACID REFLUX 90 capsule 3  . polyethylene glycol powder (GLYCOLAX/MIRALAX) powder Take 17 g by mouth daily.    . vitamin B-12 (CYANOCOBALAMIN) 1000 MCG tablet Take 1,000 mcg by mouth daily.     Current Facility-Administered Medications on File Prior to Visit  Medication Dose Route Frequency Provider Last Rate Last Dose  . Benralizumab SOSY 30 mg  30 mg Subcutaneous Q8 Weeks Kozlow, Donnamarie Poag, MD   30 mg at 11/26/18 0930   Review of Systems  Constitutional: Negative for other unusual diaphoresis or sweats HENT: Negative for ear discharge or swelling Eyes: Negative for other worsening visual disturbances Respiratory: Negative for stridor or other swelling  Gastrointestinal: Negative for worsening distension or other blood Genitourinary: Negative for retention or other urinary change Musculoskeletal: Negative for other MSK pain or swelling Skin: Negative for color change or other new lesions Neurological: Negative for worsening tremors and other numbness  Psychiatric/Behavioral: Negative for worsening agitation or other fatigue All other system neg per pt    Objective:   Physical Exam BP 136/86   Pulse 75   Temp 98.5 F (36.9 C) (Oral)   Ht 5' 3.75" (1.619 m)   Wt 171 lb (77.6 kg)   SpO2 99%   BMI 29.58 kg/m  VS noted,  Constitutional: Pt appears in NAD HENT: Head: NCAT.  Right Ear: External ear normal.  Left Ear: External ear normal.  Eyes: . Pupils are equal, round, and reactive to light. Conjunctivae and EOM are normal Nose: without d/c or deformity Neck:  Neck supple. Gross normal ROM Cardiovascular: Normal rate and regular rhythm.   Pulmonary/Chest: Effort normal and breath sounds without rales or wheezing.  Abd:  Soft, NT, ND, + BS, no organomegaly Neurological: Pt is alert. At baseline orientation, motor grossly intact Skin: Skin is warm. No rashes, other new lesions, no LE edema, right lateral ankle with tender subq nodular mass just distal and forward of the lateral malleolus Psychiatric: Pt behavior is normal without agitation  No other exam findings Lab Results  Component Value Date   WBC 7.7 11/18/2018   HGB 13.3 11/18/2018   HCT 40.7 11/18/2018   PLT 253.0 11/18/2018   GLUCOSE 83 11/18/2018   CHOL 199 11/18/2018   TRIG 65.0 11/18/2018   HDL 68.40 11/18/2018   LDLCALC 117 (H) 11/18/2018   ALT 7 11/18/2018   AST 17 11/18/2018   NA 137 11/18/2018   K 3.8  11/18/2018   CL 106 11/18/2018   CREATININE 0.80 11/18/2018   BUN 12 11/18/2018   CO2 23 11/18/2018   TSH 1.34 11/18/2018   HGBA1C 5.9 11/18/2018      Assessment & Plan:

## 2019-01-21 ENCOUNTER — Ambulatory Visit (INDEPENDENT_AMBULATORY_CARE_PROVIDER_SITE_OTHER): Payer: Medicare HMO | Admitting: *Deleted

## 2019-01-21 DIAGNOSIS — J4550 Severe persistent asthma, uncomplicated: Secondary | ICD-10-CM | POA: Diagnosis not present

## 2019-01-28 ENCOUNTER — Encounter: Payer: Self-pay | Admitting: Gastroenterology

## 2019-01-28 ENCOUNTER — Encounter: Payer: Self-pay | Admitting: Allergy

## 2019-01-28 ENCOUNTER — Ambulatory Visit: Payer: Medicare HMO | Admitting: Allergy

## 2019-01-28 ENCOUNTER — Other Ambulatory Visit: Payer: Self-pay

## 2019-01-28 VITALS — BP 128/74 | HR 86 | Temp 97.8°F | Resp 16 | Ht 63.75 in

## 2019-01-28 DIAGNOSIS — J4550 Severe persistent asthma, uncomplicated: Secondary | ICD-10-CM | POA: Diagnosis not present

## 2019-01-28 DIAGNOSIS — J3089 Other allergic rhinitis: Secondary | ICD-10-CM | POA: Diagnosis not present

## 2019-01-28 DIAGNOSIS — K219 Gastro-esophageal reflux disease without esophagitis: Secondary | ICD-10-CM

## 2019-01-28 DIAGNOSIS — L509 Urticaria, unspecified: Secondary | ICD-10-CM | POA: Diagnosis not present

## 2019-01-28 DIAGNOSIS — J339 Nasal polyp, unspecified: Secondary | ICD-10-CM

## 2019-01-28 NOTE — Progress Notes (Signed)
Follow-up Note  RE: Angelica [name] MRN: [id number] DOB: 04-18-1941 Date of Office Visit: 01/28/2019   History of present illness: Angelica [name] is a 78 y.o. female presenting today for follow-up of asthma, LPRD, allergic rhinitis with nasal polyposis and history of urticaria.   She was last seen in the office on 07/25/2018 by our nurse practioner Angelica [name].   She states that she has anemia and low iron and her GI is concerned she may have a bleed somewhere.  She is scheduled for a colonscopy next week.  She states she was advised to take her symbicort prior to procedure.  She is taking iron supplements.  She otherwise states she has been doing well.  Staying home mostly due to Covid.  She has not had any asthma flares or need to start her Symbicort.  She also denies any albuterol need.  She has not required any urgent care or emergency department visits and has not had any oral steroids since her last visit.  She states she is taking Singulair at night which she does feel it is helpful.  She also is on Fasenra injections every 8 weeks and has been doing very well on this therapy. She states that she has a little bit of drainage at night but she does continue to use her Flonase.  She states she is getting some dryness of her nose but she is using Vaseline to help this. She states her omeprazole is managing her reflux symptoms. She has not had any further urticarial episodes.   Review of systems: Review of Systems  Constitutional: Negative for chills, fever and malaise/fatigue.  HENT: Negative for congestion, ear discharge, nosebleeds, sinus pain and sore throat.   Eyes: Negative for pain, discharge and redness.  Respiratory: Negative for cough, shortness of breath and wheezing.   Cardiovascular: Negative for chest pain.  Gastrointestinal: Negative for abdominal pain, constipation, diarrhea, heartburn, nausea and vomiting.  Musculoskeletal: Negative for joint pain.  Skin: Negative for  itching and rash.  Neurological: Negative for headaches.    All other systems negative unless noted above in HPI  Past medical/social/surgical/family history have been reviewed and are unchanged unless specifically indicated below.  No changes  Medication List: Allergies as of 01/28/2019      Reactions   Asa Buff (mag [buffered Aspirin] Other (See Comments)   Excessive sweating   Fish Allergy Other (See Comments)   Unknown reaction   Other Other (See Comments)   Gelcaps; gel-containing capsules; extended release tablets   Peanut-containing Drug Products Other (See Comments)   From allergy test   Penicillins Itching   Has patient had a PCN reaction causing immediate rash, facial/tongue/throat swelling, SOB or lightheadedness with hypotension: No Has patient had a PCN reaction causing severe rash involving mucus membranes or skin necrosis: No Has patient had a PCN reaction that required hospitalization No Has patient had a PCN reaction occurring within the last 10 years: No If all of the above answers are "NO", then may proceed with Cephalosporin use.   Shellfish-derived Products Other (See Comments)   From allergy test   Strawberry Extract Other (See Comments)   Worsened allergy symptoms      Medication List       Accurate as of January 28, 2019 10:26 AM. If you have any questions, ask your nurse or doctor.        acetaminophen 500 MG tablet Commonly known as: TYLENOL Take 500 mg by mouth daily as needed  for mild pain, moderate pain, fever or headache. For pain   albuterol (2.5 MG/3ML) 0.083% nebulizer solution Commonly known as: PROVENTIL Take 2.5 mg by nebulization 2 (two) times daily as needed for wheezing or shortness of breath.   AMBULATORY NON FORMULARY MEDICATION Allergy inj Subcutaneous every 8 weeks   budesonide-formoterol 80-4.5 MCG/ACT inhaler Commonly known as: Symbicort Inhale 2 puffs into the lungs 2 (two) times daily. Use for asthma flares 2 puffs  twice a day for 2 weeks or until cough and wheeze free   cholecalciferol 25 MCG (1000 UT) tablet Commonly known as: VITAMIN D Take 2,000 Units by mouth daily.   EPINEPHrine 0.3 mg/0.3 mL Soaj injection Commonly known as: EpiPen 2-Pak Use as directed for severe allergic reaction   ipratropium 0.06 % nasal spray Commonly known as: ATROVENT USE 1 SPRAY(S) IN EACH NOSTRIL THREE TIMES DAILY   iron polysaccharides 150 MG capsule Commonly known as: Nu-Iron Take 1 capsule (150 mg total) by mouth daily.   linaclotide 72 MCG capsule Commonly known as: Linzess Take 1 capsule (72 mcg total) by mouth daily as needed.   loratadine 10 MG tablet Commonly known as: CLARITIN Take 10 mg by mouth daily.   losartan 100 MG tablet Commonly known as: COZAAR TAKE 1 TABLET EVERY DAY   montelukast 10 MG tablet Commonly known as: SINGULAIR Take 1 tablet (10 mg total) by mouth at bedtime.   omeprazole 40 MG capsule Commonly known as: PRILOSEC TAKE 1 CAPSULE DAILY FOR ACID REFLUX   PHILLIPS MILK OF MAGNESIA PO Take 1 Dose by mouth as needed.   polyethylene glycol powder 17 GM/SCOOP powder Commonly known as: GLYCOLAX/MIRALAX Take 17 g by mouth daily.   Suprep Bowel Prep Kit 17.5-3.13-1.6 GM/177ML Soln Generic drug: Na Sulfate-K Sulfate-Mg Sulf TAKE AS DIRECTED FOR 1 DOSE   vitamin B-12 1000 MCG tablet Commonly known as: CYANOCOBALAMIN Take 1,000 mcg by mouth daily.       Known medication allergies: Allergies  Allergen Reactions  . Asa Buff (Mag [Buffered Aspirin] Other (See Comments)    Excessive sweating  . Fish Allergy Other (See Comments)    Unknown reaction  . Other Other (See Comments)    Gelcaps; gel-containing capsules; extended release tablets  . Peanut-Containing Drug Products Other (See Comments)    From allergy test  . Penicillins Itching    Has patient had a PCN reaction causing immediate rash, facial/tongue/throat swelling, SOB or lightheadedness with hypotension: No  Has patient had a PCN reaction causing severe rash involving mucus membranes or skin necrosis: No Has patient had a PCN reaction that required hospitalization No Has patient had a PCN reaction occurring within the last 10 years: No If all of the above answers are "NO", then may proceed with Cephalosporin use.  . Shellfish-Derived Products Other (See Comments)    From allergy test  . Strawberry Extract Other (See Comments)    Worsened allergy symptoms     Physical examination: Blood pressure 128/74, pulse 86, temperature 97.8 F (36.6 C), temperature source Temporal, resp. rate 16, height 5' 3.75" (1.619 m), SpO2 97 %.  General: Alert, interactive, in no acute distress. HEENT: PERRLA, TMs pearly gray, turbinates non-edematous without discharge, post-pharynx non erythematous. Neck: Supple without lymphadenopathy. Lungs: Clear to auscultation without wheezing, rhonchi or rales. {no increased work of breathing. CV: Normal S1, S2 without murmurs. Abdomen: Nondistended, nontender. Skin: Warm and dry, without lesions or rashes. Extremities:  No clubbing, cyanosis or edema. Neuro:   Grossly intact.  Diagnositics/Labs:  Spirometry: FEV1: 1.77L 118%, FVC: 2.4L 123%, ratio consistent with nonobstructive pattern  Assessment and plan: Asthma, severe persistent- well controlled LPRD - well controlled Allergic rhinitis - mild nasal drainage symptoms nasal Polyposis - controlled with nasal steroid Urticaria - no further episodes    1. For asthma flares or upper respiratory infections begin Symbicort 80-2 puffs twice a day for 2 weeks or until you are cough and wheeze free.    Agree with using your Symbicort 2 puffs prior to your colonoscopy next week  2. Montelukast '10mg'$  -1 tablet 1 time per day  3.  Flonase -1 spray each nostril twice a day. Consider nasal saline rinses.    4.  Omeprazole '40mg'$  -1 tablet 1 time per day for reflux control  5. Continue Fasenra every 8 weeks   6.  If  needed:   A.  Ventolin HFA 2 puffs or albuterol nebulization every 4-6 hours  B.  OTC antihistamine and OTC Mucinex DM  C. Nasal saline spray  D. Ipratropium 0.06% 2 sprays each nostril 3 times per day   7. Return to clinic in 6 months or earlier if problem  I appreciate the opportunity to take part in Angelica [name]'s care. Please do not hesitate to contact me with questions.  Sincerely,   Prudy Feeler, MD Allergy/Immunology [facility]

## 2019-01-28 NOTE — Patient Instructions (Addendum)
  1. For asthma flares or upper respiratory infections begin Symbicort 80-2 puffs twice a day for 2 weeks or until you are cough and wheeze free.    Agree with using your Symbicort 2 puffs prior to your colonoscopy next week  2. Montelukast 10mg  -1 tablet 1 time per day  3.  Flonase -1 spray each nostril twice a day. Consider nasal saline rinses as needed for a stuffy nose  4.  Omeprazole 40mg  -1 tablet 1 time per day for reflux control  5. Continue Fasenra every 8 weeks   6.  If needed:   A.  Ventolin HFA 2 puffs or albuterol nebulization every 4-6 hours  B.  OTC antihistamine and OTC Mucinex DM  C. Nasal saline spray  D. Ipratropium 0.06% 2 sprays each nostril 3 times per day   7. Return to clinic in 6 months or earlier if problem

## 2019-02-02 ENCOUNTER — Telehealth: Payer: Self-pay | Admitting: Gastroenterology

## 2019-02-02 NOTE — Telephone Encounter (Signed)
Do you now or have you had a fever in the last 14 days?    No ° °  ° °Do you have any respiratory symptoms of shortness of breath or cough now or in the last 14 days?   No ° °  ° °Do you have any family members or close contacts with diagnosed or suspected Covid-19 in the past 14 days?    No ° °  ° °Have you been tested for Covid-19 and found to be positive?   No ° °  ° °Pt made aware to check in on the 4th floor and that care partner may wait in the car or come up to the lobby during the procedure but will need to provide their own mask. ° ° °

## 2019-02-03 ENCOUNTER — Ambulatory Visit (AMBULATORY_SURGERY_CENTER): Payer: Medicare HMO | Admitting: Gastroenterology

## 2019-02-03 ENCOUNTER — Encounter: Payer: Self-pay | Admitting: Gastroenterology

## 2019-02-03 ENCOUNTER — Other Ambulatory Visit: Payer: Self-pay

## 2019-02-03 VITALS — BP 145/103 | HR 94 | Temp 96.5°F | Resp 18 | Ht 63.75 in | Wt 174.0 lb

## 2019-02-03 DIAGNOSIS — K3189 Other diseases of stomach and duodenum: Secondary | ICD-10-CM

## 2019-02-03 DIAGNOSIS — D509 Iron deficiency anemia, unspecified: Secondary | ICD-10-CM | POA: Diagnosis not present

## 2019-02-03 DIAGNOSIS — K2950 Unspecified chronic gastritis without bleeding: Secondary | ICD-10-CM | POA: Diagnosis not present

## 2019-02-03 DIAGNOSIS — B9681 Helicobacter pylori [H. pylori] as the cause of diseases classified elsewhere: Secondary | ICD-10-CM | POA: Diagnosis not present

## 2019-02-03 DIAGNOSIS — K5730 Diverticulosis of large intestine without perforation or abscess without bleeding: Secondary | ICD-10-CM | POA: Diagnosis not present

## 2019-02-03 DIAGNOSIS — D125 Benign neoplasm of sigmoid colon: Secondary | ICD-10-CM

## 2019-02-03 DIAGNOSIS — K317 Polyp of stomach and duodenum: Secondary | ICD-10-CM

## 2019-02-03 DIAGNOSIS — K219 Gastro-esophageal reflux disease without esophagitis: Secondary | ICD-10-CM

## 2019-02-03 DIAGNOSIS — K5909 Other constipation: Secondary | ICD-10-CM | POA: Diagnosis not present

## 2019-02-03 DIAGNOSIS — K2970 Gastritis, unspecified, without bleeding: Secondary | ICD-10-CM | POA: Diagnosis not present

## 2019-02-03 DIAGNOSIS — K635 Polyp of colon: Secondary | ICD-10-CM | POA: Diagnosis not present

## 2019-02-03 DIAGNOSIS — J45909 Unspecified asthma, uncomplicated: Secondary | ICD-10-CM | POA: Diagnosis not present

## 2019-02-03 DIAGNOSIS — I10 Essential (primary) hypertension: Secondary | ICD-10-CM | POA: Diagnosis not present

## 2019-02-03 MED ORDER — SODIUM CHLORIDE 0.9 % IV SOLN: 500.0000 mL | Freq: Once | INTRAVENOUS | Status: DC

## 2019-02-03 NOTE — Patient Instructions (Signed)
Read all of the handouts given to you by your recovery room nurse.   Thank-you for choosing [place] for your healthcare needs today.  YOU HAD AN ENDOSCOPIC PROCEDURE TODAY AT [facility]:   Refer to the procedure report that was given to you for any specific questions about what was found during the examination.  If the procedure report does not answer your questions, please call your gastroenterologist to clarify.  If you requested that your care partner not be given the details of your procedure findings, then the procedure report has been included in a sealed envelope for you to review at your convenience later.  YOU SHOULD EXPECT: Some feelings of bloating in the abdomen. Passage of more gas than usual.  Walking can help get rid of the air that was put into your GI tract during the procedure and reduce the bloating. If you had a lower endoscopy (such as a colonoscopy or flexible sigmoidoscopy) you may notice spotting of blood in your stool or on the toilet paper. If you underwent a bowel prep for your procedure, you may not have a normal bowel movement for a few days.  Please Note:  You might notice some irritation and congestion in your nose or some drainage.  This is from the oxygen used during your procedure.  There is no need for concern and it should clear up in a day or so.  SYMPTOMS TO REPORT IMMEDIATELY:   Following lower endoscopy (colonoscopy or flexible sigmoidoscopy):  Excessive amounts of blood in the stool  Significant tenderness or worsening of abdominal pains  Swelling of the abdomen that is new, acute  Fever of 100F or higher   Following upper endoscopy (EGD)  Vomiting of blood or coffee ground material  New chest pain or pain under the shoulder blades  Painful or persistently difficult swallowing  New shortness of breath  Fever of 100F or higher  Black, tarry-looking stools  For urgent or emergent issues, a gastroenterologist can be reached at any hour by  calling [phone].   DIET:  We do recommend a small meal at first, but then you may proceed to your regular diet.  Drink plenty of fluids but you should avoid alcoholic beverages for 24 hours. Try to increase the fiber in your diet, and drink plenty of water.  ACTIVITY:  You should plan to take it easy for the rest of today and you should NOT DRIVE or use heavy machinery until tomorrow (because of the sedation medicines used during the test).    FOLLOW UP: Our staff will call the number listed on your records 48-72 hours following your procedure to check on you and address any questions or concerns that you may have regarding the information given to you following your procedure. If we do not reach you, we will leave a message.  We will attempt to reach you two times.  During this call, we will ask if you have developed any symptoms of COVID 19. If you develop any symptoms (ie: fever, flu-like symptoms, shortness of breath, cough etc.) before then, please call [phone].  If you test positive for Covid 19 in the 2 weeks post procedure, please call and report this information to [place].    If any biopsies were taken you will be contacted by phone or by letter within the next 1-3 weeks.  Please call us at [phone] if you have not heard about the biopsies in 3 weeks.    SIGNATURES/CONFIDENTIALITY: You  and/or your care partner have signed paperwork which will be entered into your electronic medical record.  These signatures attest to the fact that that the information above on your After Visit Summary has been reviewed and is understood.  Full responsibility of the confidentiality of this discharge information lies with you and/or your care-partner.

## 2019-02-03 NOTE — Op Note (Signed)
[facility] Patient Name: Angelica [name] Procedure Date: 02/03/2019 1:30 PM MRN: [id number] Endoscopist: Ladene Artist , MD Age: 78 Referring MD:  Date of Birth: [birth year] Gender: Female Account #: [id number] Procedure:                Colonoscopy Indications:              Iron deficiency anemia Medicines:                Monitored Anesthesia Care Procedure:                Pre-Anesthesia Assessment:                           - Prior to the procedure, a History and Physical                            was performed, and patient medications and                            allergies were reviewed. The patient's tolerance of                            previous anesthesia was also reviewed. The risks                            and benefits of the procedure and the sedation                            options and risks were discussed with the patient.                            All questions were answered, and informed consent                            was obtained. Prior Anticoagulants: The patient has                            taken no previous anticoagulant or antiplatelet                            agents. ASA Grade Assessment: II - A patient with                            mild systemic disease. After reviewing the risks                            and benefits, the patient was deemed in                            satisfactory condition to undergo the procedure.                           After obtaining informed consent, the colonoscope  was passed under direct vision. Throughout the                            procedure, the patient's blood pressure, pulse, and                            oxygen saturations were monitored continuously. The                            Colonoscope was introduced through the anus and                            advanced to the the cecum, identified by                            appendiceal orifice and ileocecal valve. The                            ileocecal valve, appendiceal orifice, and rectum                            were photographed. The quality of the bowel                            preparation was good. The colonoscopy was performed                            without difficulty. The patient tolerated the                            procedure well. Scope In: 1:36:09 PM Scope Out: 1:47:15 PM Scope Withdrawal Time: 0 hours 9 minutes 10 seconds  Total Procedure Duration: 0 hours 11 minutes 6 seconds  Findings:                 The perianal and digital rectal examinations were                            normal.                           A 5 mm polyp was found in the sigmoid colon. The                            polyp was sessile. The polyp was removed with a                            cold snare. Resection and retrieval were complete.                           Many medium-mouthed diverticula were found in the                            left colon. There was no evidence of diverticular  bleeding.                           Internal hemorrhoids were found during                            retroflexion. The hemorrhoids were medium-sized and                            Grade I (internal hemorrhoids that do not prolapse).                           The exam was otherwise without abnormality on                            direct and retroflexion views. Complications:            No immediate complications. Estimated blood loss:                            None. Estimated Blood Loss:     Estimated blood loss: none. Impression:               - One 5 mm polyp in the sigmoid colon, removed with                            a cold snare. Resected and retrieved.                           - Mild diverticulosis in the left colon.                           - Internal hemorrhoids.                           - The examination was otherwise normal on direct                            and retroflexion  views. Recommendation:           - Patient has a contact number available for                            emergencies. The signs and symptoms of potential                            delayed complications were discussed with the                            patient. Return to normal activities tomorrow.                            Written discharge instructions were provided to the                            patient.                           -  High fiber diet.                           - Continue present medications.                           - Await pathology results.                           - No repeat colonoscopy due to age. Ladene Artist, MD 02/03/2019 1:50:14 PM This report has been signed electronically.

## 2019-02-03 NOTE — Op Note (Signed)
[facility] Patient Name: Angelica [name] Procedure Date: 02/03/2019 1:30 PM MRN: [id number] Endoscopist: Ladene Artist , MD Age: 78 Referring MD:  Date of Birth: 20-Mar-1941 Gender: Female Account #: [id number] Procedure:                Upper GI endoscopy Indications:              Iron deficiency anemia, Gastroesophageal reflux                            disease Medicines:                Monitored Anesthesia Care Procedure:                Pre-Anesthesia Assessment:                           - Prior to the procedure, a History and Physical                            was performed, and patient medications and                            allergies were reviewed. The patient's tolerance of                            previous anesthesia was also reviewed. The risks                            and benefits of the procedure and the sedation                            options and risks were discussed with the patient.                            All questions were answered, and informed consent                            was obtained. Prior Anticoagulants: The patient has                            taken no previous anticoagulant or antiplatelet                            agents. ASA Grade Assessment: II - A patient with                            mild systemic disease. After reviewing the risks                            and benefits, the patient was deemed in                            satisfactory condition to undergo the procedure.  After obtaining informed consent, the endoscope was                            passed under direct vision. Throughout the                            procedure, the patient's blood pressure, pulse, and                            oxygen saturations were monitored continuously. The                            Endoscope was introduced through the mouth, and                            advanced to the third part of duodenum. The upper                             GI endoscopy was accomplished without difficulty.                            The patient tolerated the procedure well. Scope In: Scope Out: Findings:                 The examined esophagus was normal.                           A single 6 mm sessile polyp with mild contact                            bleeding and no stigmata of recent bleeding was                            found in the gastric antrum. Biopsies were taken                            with a cold forceps for histology.                           Patchy mild inflammation characterized by erythema,                            friability and granularity was found in the gastric                            fundus and in the gastric body. Mild contact                            bleeding noted. Biopsies were taken with a cold                            forceps for histology.  The exam of the stomach was otherwise normal.                           The duodenal bulb, second portion of the duodenum                            and third portion of the duodenum were normal.                            Biopsies for histology were taken with a cold                            forceps for evaluation of celiac disease. Complications:            No immediate complications. Estimated Blood Loss:     Estimated blood loss was minimal. Impression:               - Normal esophagus.                           - A single gastric polyp. Biopsied.                           - Gastritis. Biopsied.                           - Normal duodenal bulb, second portion of the                            duodenum and third portion of the duodenum.                            Biopsied. Recommendation:           - Patient has a contact number available for                            emergencies. The signs and symptoms of potential                            delayed complications were discussed with the                             patient. Return to normal activities tomorrow.                            Written discharge instructions were provided to the                            patient.                           - Resume previous diet.                           - Continue present medications.                           -  Await pathology results.                           - Consider VCE to further evaluate iron deficiency                            pending pathology review Ladene Artist, MD 02/03/2019 2:05:23 PM This report has been signed electronically.

## 2019-02-03 NOTE — Progress Notes (Signed)
Report to PACU, RN, vss, BBS= Clear.  

## 2019-02-03 NOTE — Progress Notes (Signed)
Called to room to assist during endoscopic procedure.  Patient ID and intended procedure confirmed with present staff. Received instructions for my participation in the procedure from the performing physician.  

## 2019-02-05 ENCOUNTER — Telehealth: Payer: Self-pay

## 2019-02-05 NOTE — Telephone Encounter (Signed)
  Follow up Call-  Call back number 02/03/2019  Post procedure Call Back phone  # [phone]  Permission to leave phone message Yes  Some recent data might be hidden     Patient questions:  Do you have a fever, pain , or abdominal swelling? No. Pain Score  0 *  Have you tolerated food without any problems? Yes.    Have you been able to return to your normal activities? Yes.    Do you have any questions about your discharge instructions: Diet   No. Medications  No. Follow up visit  No.  Do you have questions or concerns about your Care? No.  Actions: * If pain score is 4 or above: No action needed, pain <4.  1. Have you developed a fever since your procedure? no  2.   Have you had an respiratory symptoms (SOB or cough) since your procedure? no 3.   Have you tested positive for COVID 19 since your procedure no  4.   Have you had any family members/close contacts diagnosed with the COVID 19 since your procedure? no   If yes to any of these questions please route to Joylene John, RN and Alphonsa Gin, Therapist, sports.

## 2019-02-06 NOTE — Telephone Encounter (Signed)
Added to wait list.

## 2019-02-09 ENCOUNTER — Other Ambulatory Visit: Payer: Self-pay

## 2019-02-09 ENCOUNTER — Ambulatory Visit: Payer: Medicare HMO | Admitting: Family Medicine

## 2019-02-09 MED ORDER — BISMUTH SUBSALICYLATE 262 MG PO TABS: 2.0000 | ORAL_TABLET | Freq: Four times a day (QID) | ORAL | 0 refills | Status: AC

## 2019-02-09 MED ORDER — DOXYCYCLINE HYCLATE 100 MG PO CAPS: 100.0000 mg | ORAL_CAPSULE | Freq: Two times a day (BID) | ORAL | 0 refills | Status: AC

## 2019-02-09 MED ORDER — METRONIDAZOLE 250 MG PO TABS: 250.0000 mg | ORAL_TABLET | Freq: Four times a day (QID) | ORAL | 0 refills | Status: AC

## 2019-02-09 MED ORDER — OMEPRAZOLE 40 MG PO CPDR: 40.0000 mg | DELAYED_RELEASE_CAPSULE | Freq: Two times a day (BID) | ORAL | 0 refills | Status: DC

## 2019-02-09 NOTE — Progress Notes (Deleted)
[name] [place], [place] [address] Phone: [phone] Subjective:    I'm seeing this patient by the request  of:    CC:   [id number]  Angelica [name] is a 78 y.o. female coming in with complaint of ***  Onset-  Location Duration-  Character- Aggravating factors- Reliving factors-  Therapies tried-  Severity-     Past Medical History:  Diagnosis Date  . Arthritis   . Asthma   . Bowel obstruction ([place])   . Environmental allergies   . GERD (gastroesophageal reflux disease)   . Glaucoma 05/09/2017  . H. pylori infection   . HLD (hyperlipidemia) 01/16/2019  . Hypertension   . Iron deficiency anemia   . Osteopenia 05/26/2017  . Thyroid disease   . Urticaria 07/25/2018   Past Surgical History:  Procedure Laterality Date  . ABDOMINAL HYSTERECTOMY    . BREAST BIOPSY    . COLON SURGERY    . KNEE SURGERY    . LAPAROTOMY N/A 04/18/2015   Procedure: Exploratory laparotomy with lysis of adhesions, possible bowel resection;  Surgeon: Ralene Ok, MD;  Location: [facility];  Service: General;  Laterality: N/A;  . NASAL SINUS SURGERY    . THYROID SURGERY     Social History   Socioeconomic History  . Marital status: Widowed    Spouse name: Not on file  . Number of children: 4  . Years of education: Not on file  . Highest education level: Not on file  Occupational History  . Occupation: retired  Scientific laboratory technician  . Financial resource strain: Not on file  . Food insecurity    Worry: Not on file    Inability: Not on file  . Transportation needs    Medical: Not on file    Non-medical: Not on file  Tobacco Use  . Smoking status: Former Smoker    Types: Cigarettes  . Smokeless tobacco: Never Used  Substance and Sexual Activity  . Alcohol use: No  . Drug use: No  . Sexual activity: Never  Lifestyle  . Physical activity    Days per week: Not on file    Minutes per session: Not on file  . Stress: Not on file   Relationships  . Social Herbalist on phone: Not on file    Gets together: Not on file    Attends religious service: Not on file    Active member of club or organization: Not on file    Attends meetings of clubs or organizations: Not on file    Relationship status: Not on file  Other Topics Concern  . Not on file  Social History Narrative  . Not on file   Allergies  Allergen Reactions  . Asa Buff (Mag [Buffered Aspirin] Other (See Comments)    Excessive sweating  . Fish Allergy Other (See Comments)    Unknown reaction  . Other Other (See Comments)    Gelcaps; gel-containing capsules; extended release tablets  . Peanut-Containing Drug Products Other (See Comments)    From allergy test  . Penicillins Itching    Has patient had a PCN reaction causing immediate rash, facial/tongue/throat swelling, SOB or lightheadedness with hypotension: No Has patient had a PCN reaction causing severe rash involving mucus membranes or skin necrosis: No Has patient had a PCN reaction that required hospitalization No Has patient had a PCN reaction occurring within the last 10 years: No If all of the above answers  are "NO", then may proceed with Cephalosporin use.  . Shellfish-Derived Products Other (See Comments)    From allergy test  . Strawberry Extract Other (See Comments)    Worsened allergy symptoms   Family History  Problem Relation Age of Onset  . Diabetes Mother   . Hypertension Mother   . Hypertension Father   . Glaucoma Sister   . Glaucoma Brother   . Allergic rhinitis Neg Hx   . Angioedema Neg Hx   . Asthma Neg Hx   . Eczema Neg Hx   . Immunodeficiency Neg Hx   . Urticaria Neg Hx       Current Outpatient Medications (Cardiovascular):  Marland Kitchen  EPINEPHrine (EPIPEN 2-PAK) 0.3 mg/0.3 mL IJ SOAJ injection, Use as directed for severe allergic reaction .  losartan (COZAAR) 100 MG tablet, TAKE 1 TABLET EVERY DAY   Current Outpatient Medications (Respiratory):  .  albuterol  (PROVENTIL) (2.5 MG/3ML) 0.083% nebulizer solution, Take 2.5 mg by nebulization 2 (two) times daily as needed for wheezing or shortness of breath. .  budesonide-formoterol (SYMBICORT) 80-4.5 MCG/ACT inhaler, Inhale 2 puffs into the lungs 2 (two) times daily. Use for asthma flares 2 puffs twice a day for 2 weeks or until cough and wheeze free .  ipratropium (ATROVENT) 0.06 % nasal spray, USE 1 SPRAY(S) IN EACH NOSTRIL THREE TIMES DAILY .  loratadine (CLARITIN) 10 MG tablet, Take 10 mg by mouth daily. .  montelukast (SINGULAIR) 10 MG tablet, Take 1 tablet (10 mg total) by mouth at bedtime.  Current Facility-Administered Medications (Respiratory):  .  Benralizumab SOSY 30 mg  Current Outpatient Medications (Analgesics):  .  acetaminophen (TYLENOL) 500 MG tablet, Take 500 mg by mouth daily as needed for mild pain, moderate pain, fever or headache. For pain   Current Outpatient Medications (Hematological):  .  iron polysaccharides (NU-IRON) 150 MG capsule, Take 1 capsule (150 mg total) by mouth daily. .  vitamin B-12 (CYANOCOBALAMIN) 1000 MCG tablet, Take 1,000 mcg by mouth daily.   Current Outpatient Medications (Other):  Marland Kitchen  AMBULATORY NON FORMULARY MEDICATION, Allergy inj Subcutaneous every 8 weeks .  cholecalciferol (VITAMIN D) 1000 units tablet, Take 2,000 Units by mouth daily. Marland Kitchen  linaclotide (LINZESS) 72 MCG capsule, Take 1 capsule (72 mcg total) by mouth daily as needed. (Patient not taking: Reported on 02/03/2019) .  Magnesium Hydroxide (PHILLIPS MILK OF MAGNESIA PO), Take 1 Dose by mouth as needed. Marland Kitchen  omeprazole (PRILOSEC) 40 MG capsule, TAKE 1 CAPSULE DAILY FOR ACID REFLUX .  polyethylene glycol powder (GLYCOLAX/MIRALAX) powder, Take 17 g by mouth daily.     Past medical history, social, surgical and family history all reviewed in electronic medical record.  No pertanent information unless stated regarding to the chief complaint.   Review of Systems:  No headache, visual changes,  nausea, vomiting, diarrhea, constipation, dizziness, abdominal pain, skin rash, fevers, chills, night sweats, weight loss, swollen lymph nodes, body aches, joint swelling, muscle aches, chest pain, shortness of breath, mood changes.   Objective  There were no vitals taken for this visit. Systems examined below as of    General: No apparent distress alert and oriented x3 mood and affect normal, dressed appropriately.  HEENT: Pupils equal, extraocular movements intact  Respiratory: Patient's speak in full sentences and does not appear short of breath  Cardiovascular: No lower extremity edema, non tender, no erythema  Skin: Warm dry intact with no signs of infection or rash on extremities or on axial skeleton.  Abdomen: Soft  nontender  Neuro: Cranial nerves II through XII are intact, neurovascularly intact in all extremities with 2+ DTRs and 2+ pulses.  Lymph: No lymphadenopathy of posterior or anterior cervical chain or axillae bilaterally.  Gait normal with good balance and coordination.  MSK:  Non tender with full range of motion and good stability and symmetric strength and tone of shoulders, elbows, wrist, hip, knee and ankles bilaterally.     Impression and Recommendations:     This case required medical decision making of moderate complexity. The above documentation has been reviewed and is accurate and complete Lyndal Pulley, DO       Note: This dictation was prepared with Dragon dictation along with smaller phrase technology. Any transcriptional errors that result from this process are unintentional.

## 2019-02-18 ENCOUNTER — Encounter: Payer: Self-pay | Admitting: Family Medicine

## 2019-02-18 ENCOUNTER — Ambulatory Visit (INDEPENDENT_AMBULATORY_CARE_PROVIDER_SITE_OTHER): Payer: Medicare HMO | Admitting: Family Medicine

## 2019-02-18 ENCOUNTER — Other Ambulatory Visit: Payer: Self-pay

## 2019-02-18 DIAGNOSIS — M25471 Effusion, right ankle: Secondary | ICD-10-CM | POA: Insufficient documentation

## 2019-02-18 NOTE — Assessment & Plan Note (Signed)
Patient has swelling of the right ankle.  Patient has had vein surgery on this side.  Do believe that that is contributing to some of the swelling and this seems to be giving her more the pain.  No mass appreciated.  Discussed compression, elevation, potential icing regimen.  Range of motion exercises and pedal pumping noted.  Patient has responded with this hopefully in previously.  Follow-up again 4 to 8 weeks

## 2019-02-18 NOTE — Progress Notes (Signed)
[name] [name], [name] [address] Phone: [phone] Subjective:   I Angelica [name] am serving as a Education administrator for Dr. Hulan Saas.   CC: Right foot and ankle pain  [id number]  Angelica [name] is a 78 y.o. female coming in with complaint of right foot. Ganglion cyst. Pain in certain ROM. Some swelling with compression socks. Swollen today. History of left hip issues.   Onset- 1 month  Location - lateral  Character- pulling Aggravating factors- plantar flexion  Reliving factors-  Therapies tried- alcohol rub  Severity-5 out of 10     Past Medical History:  Diagnosis Date  . Arthritis   . Asthma   . Bowel obstruction ([place])   . Environmental allergies   . GERD (gastroesophageal reflux disease)   . Glaucoma 05/09/2017  . H. pylori infection   . HLD (hyperlipidemia) 01/16/2019  . Hypertension   . Iron deficiency anemia   . Osteopenia 05/26/2017  . Thyroid disease   . Urticaria 07/25/2018   Past Surgical History:  Procedure Laterality Date  . ABDOMINAL HYSTERECTOMY    . BREAST BIOPSY    . COLON SURGERY    . KNEE SURGERY    . LAPAROTOMY N/A 04/18/2015   Procedure: Exploratory laparotomy with lysis of adhesions, possible bowel resection;  Surgeon: Angelica Ok, MD;  Location: [facility];  Service: General;  Laterality: N/A;  . NASAL SINUS SURGERY    . THYROID SURGERY     Social History   Socioeconomic History  . Marital status: Widowed    Spouse name: Not on file  . Number of children: 4  . Years of education: Not on file  . Highest education level: Not on file  Occupational History  . Occupation: retired  Scientific laboratory technician  . Financial resource strain: Not on file  . Food insecurity    Worry: Not on file    Inability: Not on file  . Transportation needs    Medical: Not on file    Non-medical: Not on file  Tobacco Use  . Smoking status: Former Smoker    Types: Cigarettes  . Smokeless tobacco: Never Used  Substance  and Sexual Activity  . Alcohol use: No  . Drug use: No  . Sexual activity: Never  Lifestyle  . Physical activity    Days per week: Not on file    Minutes per session: Not on file  . Stress: Not on file  Relationships  . Social Herbalist on phone: Not on file    Gets together: Not on file    Attends religious service: Not on file    Active member of club or organization: Not on file    Attends meetings of clubs or organizations: Not on file    Relationship status: Not on file  Other Topics Concern  . Not on file  Social History Narrative  . Not on file   Allergies  Allergen Reactions  . Asa Buff (Mag [Buffered Aspirin] Other (See Comments)    Excessive sweating  . Fish Allergy Other (See Comments)    Unknown reaction  . Other Other (See Comments)    Gelcaps; gel-containing capsules; extended release tablets  . Peanut-Containing Drug Products Other (See Comments)    From allergy test  . Penicillins Itching    Has patient had a PCN reaction causing immediate rash, facial/tongue/throat swelling, SOB or lightheadedness with hypotension: No Has patient had a PCN reaction causing severe  rash involving mucus membranes or skin necrosis: No Has patient had a PCN reaction that required hospitalization No Has patient had a PCN reaction occurring within the last 10 years: No If all of the above answers are "NO", then may proceed with Cephalosporin use.  . Shellfish-Derived Products Other (See Comments)    From allergy test  . Strawberry Extract Other (See Comments)    Worsened allergy symptoms   Family History  Problem Relation Age of Onset  . Diabetes Mother   . Hypertension Mother   . Hypertension Father   . Glaucoma Sister   . Glaucoma Brother   . Allergic rhinitis Neg Hx   . Angioedema Neg Hx   . Asthma Neg Hx   . Eczema Neg Hx   . Immunodeficiency Neg Hx   . Urticaria Neg Hx       Current Outpatient Medications (Cardiovascular):  Marland Kitchen  EPINEPHrine (EPIPEN  2-PAK) 0.3 mg/0.3 mL IJ SOAJ injection, Use as directed for severe allergic reaction .  losartan (COZAAR) 100 MG tablet, TAKE 1 TABLET EVERY DAY   Current Outpatient Medications (Respiratory):  .  albuterol (PROVENTIL) (2.5 MG/3ML) 0.083% nebulizer solution, Take 2.5 mg by nebulization 2 (two) times daily as needed for wheezing or shortness of breath. .  budesonide-formoterol (SYMBICORT) 80-4.5 MCG/ACT inhaler, Inhale 2 puffs into the lungs 2 (two) times daily. Use for asthma flares 2 puffs twice a day for 2 weeks or until cough and wheeze free .  ipratropium (ATROVENT) 0.06 % nasal spray, USE 1 SPRAY(S) IN EACH NOSTRIL THREE TIMES DAILY .  loratadine (CLARITIN) 10 MG tablet, Take 10 mg by mouth daily. .  montelukast (SINGULAIR) 10 MG tablet, Take 1 tablet (10 mg total) by mouth at bedtime.  Current Facility-Administered Medications (Respiratory):  .  Benralizumab SOSY 30 mg  Current Outpatient Medications (Analgesics):  .  acetaminophen (TYLENOL) 500 MG tablet, Take 500 mg by mouth daily as needed for mild pain, moderate pain, fever or headache. For pain   Current Outpatient Medications (Hematological):  .  iron polysaccharides (NU-IRON) 150 MG capsule, Take 1 capsule (150 mg total) by mouth daily. .  vitamin B-12 (CYANOCOBALAMIN) 1000 MCG tablet, Take 1,000 mcg by mouth daily.   Current Outpatient Medications (Other):  Marland Kitchen  AMBULATORY NON FORMULARY MEDICATION, Allergy inj Subcutaneous every 8 weeks .  Bismuth Subsalicylate 756 MG TABS, Take 2 tablets (524 mg total) by mouth 4 (four) times daily for 14 days. .  cholecalciferol (VITAMIN D) 1000 units tablet, Take 2,000 Units by mouth daily. Marland Kitchen  doxycycline (VIBRAMYCIN) 100 MG capsule, Take 1 capsule (100 mg total) by mouth 2 (two) times daily for 14 days. Marland Kitchen  linaclotide (LINZESS) 72 MCG capsule, Take 1 capsule (72 mcg total) by mouth daily as needed. .  Magnesium Hydroxide (PHILLIPS MILK OF MAGNESIA PO), Take 1 Dose by mouth as needed. .   metroNIDAZOLE (FLAGYL) 250 MG tablet, Take 1 tablet (250 mg total) by mouth 4 (four) times daily for 14 days. Marland Kitchen  omeprazole (PRILOSEC) 40 MG capsule, TAKE 1 CAPSULE DAILY FOR ACID REFLUX .  omeprazole (PRILOSEC) 40 MG capsule, Take 1 capsule (40 mg total) by mouth 2 (two) times daily for 14 days. .  polyethylene glycol powder (GLYCOLAX/MIRALAX) powder, Take 17 g by mouth daily.     Past medical history, social, surgical and family history all reviewed in electronic medical record.  No pertanent information unless stated regarding to the chief complaint.   Review of Systems:  No headache,  visual changes, nausea, vomiting, diarrhea, constipation, dizziness, abdominal pain, skin rash, fevers, chills, night sweats, weight loss, swollen lymph nodes, body aches, joint swelling,  chest pain, shortness of breath, mood changes.  Positive muscle aches  Objective  Blood pressure (!) 142/80, pulse 79, height 5' 3.75" (1.619 m), weight 176 lb (79.8 kg), SpO2 98 %.    General: No apparent distress alert and oriented x3 mood and affect normal, dressed appropriately.  HEENT: Pupils equal, extraocular movements intact  Respiratory: Patient's speak in full sentences and does not appear short of breath  Cardiovascular: No lower extremity edema, non tender, no erythema  Skin: Warm dry intact with no signs of infection or rash on extremities or on axial skeleton.  Abdomen: Soft nontender  Neuro: Cranial nerves II through XII are intact, neurovascularly intact in all extremities with 2+ DTRs and 2+ pulses.  Lymph: No lymphadenopathy of posterior or anterior cervical chain or axillae bilaterally.  Gait antalgic uses the aid of a cane MSK:  tender with full range of motion and good stability and symmetric strength and tone of shoulders, elbows, wrist, hip, knee and bilaterally.  Right foot exam shows the patient does have 2+ pitting edema noted.  Seems to going to the ankle.  Mild arthritic changes of the ankle  no mass appreciated     Impression and Recommendations:     This case required medical decision making of moderate complexity. The above documentation has been reviewed and is accurate and complete Lyndal Pulley, DO       Note: This dictation was prepared with Dragon dictation along with smaller phrase technology. Any transcriptional errors that result from this process are unintentional.

## 2019-02-18 NOTE — Patient Instructions (Signed)
Good to see you See me again in 5 weeks Compression sock Foot above heart with laying

## 2019-03-18 ENCOUNTER — Ambulatory Visit (INDEPENDENT_AMBULATORY_CARE_PROVIDER_SITE_OTHER): Payer: Medicare HMO

## 2019-03-18 ENCOUNTER — Other Ambulatory Visit: Payer: Self-pay

## 2019-03-18 DIAGNOSIS — J4550 Severe persistent asthma, uncomplicated: Secondary | ICD-10-CM | POA: Diagnosis not present

## 2019-03-24 ENCOUNTER — Ambulatory Visit: Payer: Medicare HMO | Admitting: Family Medicine

## 2019-04-07 ENCOUNTER — Ambulatory Visit (INDEPENDENT_AMBULATORY_CARE_PROVIDER_SITE_OTHER): Payer: Medicare HMO | Admitting: Family Medicine

## 2019-04-07 ENCOUNTER — Encounter: Payer: Self-pay | Admitting: Family Medicine

## 2019-04-07 ENCOUNTER — Other Ambulatory Visit: Payer: Self-pay

## 2019-04-07 DIAGNOSIS — M7062 Trochanteric bursitis, left hip: Secondary | ICD-10-CM | POA: Diagnosis not present

## 2019-04-07 DIAGNOSIS — M25471 Effusion, right ankle: Secondary | ICD-10-CM | POA: Diagnosis not present

## 2019-04-07 NOTE — Assessment & Plan Note (Signed)
Significant improvement.  Patient does have some lateral column overload.  Could be contributing to some of the discomfort and pain.  Discussed with patient about proper shoes, over-the-counter orthotics, which activities to do which wants to avoid.  Discussed topical anti-inflammatories and icing regimen.  Declined formal physical therapy.  Follow-up again in 4 to 6 weeks

## 2019-04-07 NOTE — Patient Instructions (Signed)
Good to see you Exercise 3 times a week Ice for 20 minutes after activity Pennsaid small amount over most painful spot 2 times a day as needed See me again in 2 months

## 2019-04-07 NOTE — Progress Notes (Signed)
[name] [place], [place] [address] Phone: [phone] Subjective:   I Angelica [name] am serving as a Education administrator for Dr. Hulan Saas.   CC: Right ankle pain  [id number]   02/18/2019 Patient has swelling of the right ankle.  Patient has had vein surgery on this side.  Do believe that that is contributing to some of the swelling and this seems to be giving her more the pain.  No mass appreciated.  Discussed compression, elevation, potential icing regimen.  Range of motion exercises and pedal pumping noted.  Patient has responded with this hopefully in previously.  Follow-up again 4 to 8 weeks  04/07/2019 Angelica [name] is a 78 y.o. female coming in with complaint of right ankle pain. Patient states her swelling has decreased. Some pain on the lateral side. End of September had issues with the left hip. Lateral hip pain. States the hip pops and is painful with walking.  Patient states having some mild discomfort overall.  Patient states making significant progress with mild pain on the lateral aspect of the ankle.  Lateral hip.  Minimal as well.     Past Medical History:  Diagnosis Date  . Arthritis   . Asthma   . Bowel obstruction ([place])   . Environmental allergies   . GERD (gastroesophageal reflux disease)   . Glaucoma 05/09/2017  . H. pylori infection   . HLD (hyperlipidemia) 01/16/2019  . Hypertension   . Iron deficiency anemia   . Osteopenia 05/26/2017  . Thyroid disease   . Urticaria 07/25/2018   Past Surgical History:  Procedure Laterality Date  . ABDOMINAL HYSTERECTOMY    . BREAST BIOPSY    . COLON SURGERY    . KNEE SURGERY    . LAPAROTOMY N/A 04/18/2015   Procedure: Exploratory laparotomy with lysis of adhesions, possible bowel resection;  Surgeon: Ralene Ok, MD;  Location: [facility];  Service: General;  Laterality: N/A;  . NASAL SINUS SURGERY    . THYROID SURGERY     Social History   Socioeconomic History  . Marital  status: Widowed    Spouse name: Not on file  . Number of children: 4  . Years of education: Not on file  . Highest education level: Not on file  Occupational History  . Occupation: retired  Scientific laboratory technician  . Financial resource strain: Not on file  . Food insecurity    Worry: Not on file    Inability: Not on file  . Transportation needs    Medical: Not on file    Non-medical: Not on file  Tobacco Use  . Smoking status: Former Smoker    Types: Cigarettes  . Smokeless tobacco: Never Used  Substance and Sexual Activity  . Alcohol use: No  . Drug use: No  . Sexual activity: Never  Lifestyle  . Physical activity    Days per week: Not on file    Minutes per session: Not on file  . Stress: Not on file  Relationships  . Social Herbalist on phone: Not on file    Gets together: Not on file    Attends religious service: Not on file    Active member of club or organization: Not on file    Attends meetings of clubs or organizations: Not on file    Relationship status: Not on file  Other Topics Concern  . Not on file  Social History Narrative  . Not on file  Allergies  Allergen Reactions  . Asa Buff (Mag [Buffered Aspirin] Other (See Comments)    Excessive sweating  . Fish Allergy Other (See Comments)    Unknown reaction  . Other Other (See Comments)    Gelcaps; gel-containing capsules; extended release tablets  . Peanut-Containing Drug Products Other (See Comments)    From allergy test  . Penicillins Itching    Has patient had a PCN reaction causing immediate rash, facial/tongue/throat swelling, SOB or lightheadedness with hypotension: No Has patient had a PCN reaction causing severe rash involving mucus membranes or skin necrosis: No Has patient had a PCN reaction that required hospitalization No Has patient had a PCN reaction occurring within the last 10 years: No If all of the above answers are "NO", then may proceed with Cephalosporin use.  . Shellfish-Derived  Products Other (See Comments)    From allergy test  . Strawberry Extract Other (See Comments)    Worsened allergy symptoms   Family History  Problem Relation Age of Onset  . Diabetes Mother   . Hypertension Mother   . Hypertension Father   . Glaucoma Sister   . Glaucoma Brother   . Allergic rhinitis Neg Hx   . Angioedema Neg Hx   . Asthma Neg Hx   . Eczema Neg Hx   . Immunodeficiency Neg Hx   . Urticaria Neg Hx       Current Outpatient Medications (Cardiovascular):  Marland Kitchen  EPINEPHrine (EPIPEN 2-PAK) 0.3 mg/0.3 mL IJ SOAJ injection, Use as directed for severe allergic reaction .  losartan (COZAAR) 100 MG tablet, TAKE 1 TABLET EVERY DAY   Current Outpatient Medications (Respiratory):  .  albuterol (PROVENTIL) (2.5 MG/3ML) 0.083% nebulizer solution, Take 2.5 mg by nebulization 2 (two) times daily as needed for wheezing or shortness of breath. .  budesonide-formoterol (SYMBICORT) 80-4.5 MCG/ACT inhaler, Inhale 2 puffs into the lungs 2 (two) times daily. Use for asthma flares 2 puffs twice a day for 2 weeks or until cough and wheeze free .  ipratropium (ATROVENT) 0.06 % nasal spray, USE 1 SPRAY(S) IN EACH NOSTRIL THREE TIMES DAILY .  loratadine (CLARITIN) 10 MG tablet, Take 10 mg by mouth daily. .  montelukast (SINGULAIR) 10 MG tablet, Take 1 tablet (10 mg total) by mouth at bedtime.  Current Facility-Administered Medications (Respiratory):  .  Benralizumab SOSY 30 mg  Current Outpatient Medications (Analgesics):  .  acetaminophen (TYLENOL) 500 MG tablet, Take 500 mg by mouth daily as needed for mild pain, moderate pain, fever or headache. For pain   Current Outpatient Medications (Hematological):  .  iron polysaccharides (NU-IRON) 150 MG capsule, Take 1 capsule (150 mg total) by mouth daily. .  vitamin B-12 (CYANOCOBALAMIN) 1000 MCG tablet, Take 1,000 mcg by mouth daily.   Current Outpatient Medications (Other):  Marland Kitchen  AMBULATORY NON FORMULARY MEDICATION, Allergy inj Subcutaneous  every 8 weeks .  cholecalciferol (VITAMIN D) 1000 units tablet, Take 2,000 Units by mouth daily. Marland Kitchen  linaclotide (LINZESS) 72 MCG capsule, Take 1 capsule (72 mcg total) by mouth daily as needed. .  Magnesium Hydroxide (PHILLIPS MILK OF MAGNESIA PO), Take 1 Dose by mouth as needed. Marland Kitchen  omeprazole (PRILOSEC) 40 MG capsule, TAKE 1 CAPSULE DAILY FOR ACID REFLUX .  polyethylene glycol powder (GLYCOLAX/MIRALAX) powder, Take 17 g by mouth daily. Marland Kitchen  omeprazole (PRILOSEC) 40 MG capsule, Take 1 capsule (40 mg total) by mouth 2 (two) times daily for 14 days.     Past medical history, social, surgical and family  history all reviewed in electronic medical record.  No pertanent information unless stated regarding to the chief complaint.   Review of Systems:  No headache, visual changes, nausea, vomiting, diarrhea, constipation, dizziness, abdominal pain, skin rash, fevers, chills, night sweats, weight loss, swollen lymph nodes, body aches, joint swelling, muscle aches, chest pain, shortness of breath, mood changes.  Positive muscle aches  Objective  Blood pressure (!) 158/70, pulse 82, height 5\' 3"  (1.6 m), weight 177 lb (80.3 kg), SpO2 94 %.    General: No apparent distress alert and oriented x3 mood and affect normal, dressed appropriately.  HEENT: Pupils equal, extraocular movements intact  Respiratory: Patient's speak in full sentences and does not appear short of breath  Cardiovascular: No lower extremity edema, non tender, no erythema  Skin: Warm dry intact with no signs of infection or rash on extremities or on axial skeleton.  Abdomen: Soft nontender  Neuro: Cranial nerves II through XII are intact, neurovascularly intact in all extremities with 2+ DTRs and 2+ pulses.  Lymph: No lymphadenopathy of posterior or anterior cervical chain or axillae bilaterally.  Gait antalgic MSK:  tender with mild limited range of motion and good stability and symmetric strength and tone of shoulders, elbows,  wrist,  knee  bilaterally.  Moderate arthritic changes of multiple joints Ankle: Right ankle No visible erythema or swelling. Range of motion is full in all directions. Strength is 5/5 in all directions. Stable lateral and medial ligaments; squeeze test and kleiger test unremarkable; pain no with stressing the ATFL but good endpoint Talar dome minorly tender on the anterior lateral aspect; Mild pain more distal aspect of the fifth metatarsal. No tenderness on posterior aspects of lateral and medial malleolus No sign of peroneal tendon subluxations or tenderness to palpation Negative tarsal tunnel tinel's Able to walk 4 steps. Contralateral ankle show some moderate arthritic changes in the pes planus but no severe pain  Left hip exam shows the patient does have tender over the lateral aspect of the hip over the greater trochanteric area.  Negative straight leg test.  Positive FABER test.  [id number]; 15 additional minutes spent for Therapeutic exercises as stated in above notes.  This included exercises focusing on stretching, strengthening, with significant focus on eccentric aspects.   Long term goals include an improvement in range of motion, strength, endurance as well as avoiding reinjury. Patient's frequency would include in 1-2 times a day, 3-5 times a week for a duration of 6-12 weeks. Hip strengthening exercises which included:  Pelvic tilt/bracing to help with proper recruitment of the lower abs and pelvic floor muscles  Glute strengthening to properly contract glutes without over-engaging low back and hamstrings - prone hip extension and glute bridge exercises Proper stretching techniques to increase effectiveness for the hip flexors, groin, quads, piriformic and low back when appropriate    Proper technique shown and discussed handout in great detail with ATC.  All questions were discussed and answered.     Impression and Recommendations:     . The above documentation has been  reviewed and is accurate and complete Lyndal Pulley, DO       Note: This dictation was prepared with Dragon dictation along with smaller phrase technology. Any transcriptional errors that result from this process are unintentional.

## 2019-04-07 NOTE — Assessment & Plan Note (Signed)
Left greater trochanteric bursitis.  Discussed icing regimen and home exercise, discussed avoiding certain activities.  Patient is to increase activity slowly over the course the next several weeks.  If no improvement will consider injection and follow-up in 4 to 8 weeks

## 2019-04-15 ENCOUNTER — Telehealth: Payer: Self-pay | Admitting: Allergy

## 2019-04-15 NOTE — Telephone Encounter (Signed)
Patient called stating that she is having green/yellow mucus and would like something called in for this. Patient states that it was clear but now yellowish.  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   Please advise.

## 2019-04-15 NOTE — Telephone Encounter (Signed)
Patient stated symptoms started a few days ago and just progressively getting worse this week. Advise patient to try Mucinex along with current regimen and lots of water. Patient verbalized understanding and will try it.

## 2019-04-15 NOTE — Telephone Encounter (Signed)
How long has she been having the nasal congestion and colored drainage?  Any pressure or pain in forehead or cheeks or any pain in teeth or any headaches?  If nasal symptoms have been less than a week it is not likely to be bacterial and thus would not warrant any antibiotics.    Would however try use of plain Mucinex 1 tab 1-2 times a day with plenty of water to help thin the mucus out more so its easier to blow or rinse out with nasal rinses.  Continue her nasal sprays and antihistamine.

## 2019-04-15 NOTE — Telephone Encounter (Signed)
Pt is not having and other symptoms denys fever, just yellow stuffy nose, when she blows it. Pt is taking 2 nose sprays and, pt is taking claritin every day. No body aches. Not cold and no chills. Pt starts she is breathing good. When she gets up in the mornings and when it is yellow. She is doing sinus rinses and states a lot of junk comes out when she does that.

## 2019-05-11 ENCOUNTER — Other Ambulatory Visit: Payer: Self-pay | Admitting: Allergy and Immunology

## 2019-05-13 ENCOUNTER — Encounter: Payer: Self-pay | Admitting: Internal Medicine

## 2019-05-13 ENCOUNTER — Ambulatory Visit (INDEPENDENT_AMBULATORY_CARE_PROVIDER_SITE_OTHER): Payer: Medicare HMO | Admitting: *Deleted

## 2019-05-13 ENCOUNTER — Other Ambulatory Visit: Payer: Self-pay

## 2019-05-13 ENCOUNTER — Ambulatory Visit (INDEPENDENT_AMBULATORY_CARE_PROVIDER_SITE_OTHER): Payer: Medicare HMO | Admitting: Internal Medicine

## 2019-05-13 VITALS — BP 172/80 | HR 75 | Temp 97.9°F | Wt 177.6 lb

## 2019-05-13 DIAGNOSIS — I10 Essential (primary) hypertension: Secondary | ICD-10-CM

## 2019-05-13 DIAGNOSIS — J4550 Severe persistent asthma, uncomplicated: Secondary | ICD-10-CM | POA: Diagnosis not present

## 2019-05-13 DIAGNOSIS — M545 Low back pain, unspecified: Secondary | ICD-10-CM

## 2019-05-13 DIAGNOSIS — R739 Hyperglycemia, unspecified: Secondary | ICD-10-CM | POA: Diagnosis not present

## 2019-05-13 MED ORDER — AMLODIPINE BESYLATE 10 MG PO TABS: 10.0000 mg | ORAL_TABLET | Freq: Every day | ORAL | 3 refills | Status: DC

## 2019-05-13 NOTE — Patient Instructions (Signed)
Please take all new medication as prescribed - the amlodipine 10 mg per day  You can also take the tylenol for the left sciatica pain as needed  Please continue all other medications as before, and refills have been done if requested.  Please have the pharmacy call with any other refills you may need.  Please continue your efforts at being more active, low cholesterol diet, and weight control.  Please keep your appointments with your specialists as you may have planned  Please return in 3 months, or sooner if needed (the office will call you)

## 2019-05-13 NOTE — Progress Notes (Signed)
Subjective:    Patient ID: Angelica [name], female    DOB: May 09, 1941, 78 y.o.   MRN: [id number]  HPI  Here to f/u; overall doing ok,  Pt denies chest pain, increasing sob or doe, wheezing, orthopnea, PND, increased LE swelling, palpitations, dizziness or syncope.  Pt denies new neurological symptoms such as new headache, or facial or extremity weakness or numbness.  Pt denies polydipsia, polyuria, or low sugar episode.  Pt states overall good compliance with meds, mostly trying to follow appropriate diet, with wt overall stable,  but little exercise however. Pt continues to have recurring left LBP with recurreing LLE pain, but no bowel or bladder change, fever, wt loss,  worsening LE numbness/weakness, gait change or falls.   Past Medical History:  Diagnosis Date  . Arthritis   . Asthma   . Bowel obstruction ([place])   . Environmental allergies   . GERD (gastroesophageal reflux disease)   . Glaucoma 05/09/2017  . H. pylori infection   . HLD (hyperlipidemia) 01/16/2019  . Hypertension   . Iron deficiency anemia   . Osteopenia 05/26/2017  . Thyroid disease   . Urticaria 07/25/2018   Past Surgical History:  Procedure Laterality Date  . ABDOMINAL HYSTERECTOMY    . BREAST BIOPSY    . COLON SURGERY    . KNEE SURGERY    . LAPAROTOMY N/A 04/18/2015   Procedure: Exploratory laparotomy with lysis of adhesions, possible bowel resection;  Surgeon: Ralene Ok, MD;  Location: [facility];  Service: General;  Laterality: N/A;  . NASAL SINUS SURGERY    . THYROID SURGERY      reports that she has quit smoking. Her smoking use included cigarettes. She has never used smokeless tobacco. She reports that she does not drink alcohol or use drugs. family history includes Diabetes in her mother; Glaucoma in her brother and sister; Hypertension in her father and mother. Allergies  Allergen Reactions  . Asa Buff (Mag [Buffered Aspirin] Other (See Comments)    Excessive sweating  . Fish Allergy Other (See  Comments)    Unknown reaction  . Other Other (See Comments)    Gelcaps; gel-containing capsules; extended release tablets  . Peanut-Containing Drug Products Other (See Comments)    From allergy test  . Penicillins Itching    Has patient had a PCN reaction causing immediate rash, facial/tongue/throat swelling, SOB or lightheadedness with hypotension: No Has patient had a PCN reaction causing severe rash involving mucus membranes or skin necrosis: No Has patient had a PCN reaction that required hospitalization No Has patient had a PCN reaction occurring within the last 10 years: No If all of the above answers are "NO", then may proceed with Cephalosporin use.  Marland Kitchen Shellfish-Derived Products Other (See Comments)    From allergy test  . Strawberry Extract Other (See Comments)    Worsened allergy symptoms   Current Outpatient Medications on File Prior to Visit  Medication Sig Dispense Refill  . acetaminophen (TYLENOL) 500 MG tablet Take 500 mg by mouth daily as needed for mild pain, moderate pain, fever or headache. For pain    . albuterol (PROVENTIL) (2.5 MG/3ML) 0.083% nebulizer solution Take 2.5 mg by nebulization 2 (two) times daily as needed for wheezing or shortness of breath.    . AMBULATORY NON FORMULARY MEDICATION Allergy inj Subcutaneous every 8 weeks    . budesonide-formoterol (SYMBICORT) 80-4.5 MCG/ACT inhaler Inhale 2 puffs into the lungs 2 (two) times daily. Use for asthma flares 2 puffs twice a  day for 2 weeks or until cough and wheeze free 1 Inhaler 5  . cholecalciferol (VITAMIN D) 1000 units tablet Take 2,000 Units by mouth daily.    Marland Kitchen EPINEPHrine (EPIPEN 2-PAK) 0.3 mg/0.3 mL IJ SOAJ injection Use as directed for severe allergic reaction 2 Device 1  . ipratropium (ATROVENT) 0.06 % nasal spray Place 2 sprays into both nostrils 3 (three) times daily. 15 mL 3  . iron polysaccharides (NU-IRON) 150 MG capsule Take 1 capsule (150 mg total) by mouth daily. 90 capsule 1  . linaclotide  (LINZESS) 72 MCG capsule Take 1 capsule (72 mcg total) by mouth daily as needed. 30 capsule 5  . loratadine (CLARITIN) 10 MG tablet Take 10 mg by mouth daily.    Marland Kitchen losartan (COZAAR) 100 MG tablet TAKE 1 TABLET EVERY DAY 90 tablet 3  . Magnesium Hydroxide (PHILLIPS MILK OF MAGNESIA PO) Take 1 Dose by mouth as needed.    . montelukast (SINGULAIR) 10 MG tablet Take 1 tablet (10 mg total) by mouth at bedtime. 90 tablet 3  . omeprazole (PRILOSEC) 40 MG capsule TAKE 1 CAPSULE DAILY FOR ACID REFLUX 90 capsule 3  . polyethylene glycol powder (GLYCOLAX/MIRALAX) powder Take 17 g by mouth daily.    . vitamin B-12 (CYANOCOBALAMIN) 1000 MCG tablet Take 1,000 mcg by mouth daily.    Marland Kitchen omeprazole (PRILOSEC) 40 MG capsule Take 1 capsule (40 mg total) by mouth 2 (two) times daily for 14 days. 28 capsule 0   Current Facility-Administered Medications on File Prior to Visit  Medication Dose Route Frequency Provider Last Rate Last Admin  . Benralizumab SOSY 30 mg  30 mg Subcutaneous Q8 Weeks Kozlow, Donnamarie Poag, MD   30 mg at 05/13/19 1607   Review of Systems  Constitutional: Negative for other unusual diaphoresis or sweats HENT: Negative for ear discharge or swelling Eyes: Negative for other worsening visual disturbances Respiratory: Negative for stridor or other swelling  Gastrointestinal: Negative for worsening distension or other blood Genitourinary: Negative for retention or other urinary change Musculoskeletal: Negative for other MSK pain or swelling Skin: Negative for color change or other new lesions Neurological: Negative for worsening tremors and other numbness  Psychiatric/Behavioral: Negative for worsening agitation or other fatigue All otherwise neg per pt     Objective:   Physical Exam BP (!) 172/80 (BP Location: Left Arm)   Pulse 75   Temp 97.9 F (36.6 C) (Oral)   Wt 177 lb 9.6 oz (80.6 kg)   SpO2 99%   BMI 31.46 kg/m  VS noted,  Constitutional: Pt appears in NAD HENT: Head: NCAT.    Right Ear: External ear normal.  Left Ear: External ear normal.  Eyes: . Pupils are equal, round, and reactive to light. Conjunctivae and EOM are normal Nose: without d/c or deformity Neck: Neck supple. Gross normal ROM Cardiovascular: Normal rate and regular rhythm.   Pulmonary/Chest: Effort normal and breath sounds without rales or wheezing.  Abd:  Soft, NT, ND, + BS, no organomegaly Neurological: Pt is alert. At baseline orientation, motor grossly intact Skin: Skin is warm. No rashes, other new lesions, no LE edema Psychiatric: Pt behavior is normal without agitation  All otherwise neg per pt Lab Results  Component Value Date   WBC 7.7 11/18/2018   HGB 13.3 11/18/2018   HCT 40.7 11/18/2018   PLT 253.0 11/18/2018   GLUCOSE 83 11/18/2018   CHOL 199 11/18/2018   TRIG 65.0 11/18/2018   HDL 68.40 11/18/2018   LDLCALC 117 (  H) 11/18/2018   ALT 7 11/18/2018   AST 17 11/18/2018   NA 137 11/18/2018   K 3.8 11/18/2018   CL 106 11/18/2018   CREATININE 0.80 11/18/2018   BUN 12 11/18/2018   CO2 23 11/18/2018   TSH 1.34 11/18/2018   HGBA1C 5.9 11/18/2018         Assessment & Plan:

## 2019-05-22 ENCOUNTER — Encounter: Payer: Self-pay | Admitting: Internal Medicine

## 2019-05-22 NOTE — Assessment & Plan Note (Signed)
stable overall by history and exam, recent data reviewed with pt, and pt to continue medical treatment as before,  to f/u any worsening symptoms or concerns  

## 2019-05-22 NOTE — Assessment & Plan Note (Signed)
Uncontrolled, o/w stable overall by history and exam, recent data reviewed with pt, and pt to continue medical treatment as before,  to f/u any worsening symptoms or concerns, to add amlodipine 10 qd,  to f/u any worsening symptoms or concerns

## 2019-05-22 NOTE — Assessment & Plan Note (Signed)
Mild, stable, for tylenol prn

## 2019-06-03 ENCOUNTER — Ambulatory Visit: Payer: Medicare HMO | Attending: Internal Medicine

## 2019-06-03 DIAGNOSIS — Z20822 Contact with and (suspected) exposure to covid-19: Secondary | ICD-10-CM

## 2019-06-05 LAB — NOVEL CORONAVIRUS, NAA: SARS-CoV-2, NAA: NOT DETECTED

## 2019-06-09 ENCOUNTER — Other Ambulatory Visit: Payer: Self-pay | Admitting: Internal Medicine

## 2019-06-09 ENCOUNTER — Ambulatory Visit: Payer: Medicare HMO | Admitting: Family Medicine

## 2019-06-09 NOTE — Progress Notes (Deleted)
[facility] [place] Phone: [phone] Subjective:    I'm seeing this patient by the request  of:  [name], MD  CC:   [id number]   04/07/2019 Significant improvement.  Patient does have some lateral column overload.  Could be contributing to some of the discomfort and pain.  Discussed with patient about proper shoes, over-the-counter orthotics, which activities to do which wants to avoid.  Discussed topical anti-inflammatories and icing regimen.  Declined formal physical therapy.  Follow-up again in 4 to 6 weeks  Left greater trochanteric bursitis.  Discussed icing regimen and home exercise, discussed avoiding certain activities.  Patient is to increase activity slowly over the course the next several weeks.  If no improvement will consider injection and follow-up in 4 to 8 weeks  Update 06/09/2019 Angelica [name] is a 79 y.o. female coming in with complaint of ***       Past Medical History:  Diagnosis Date  . Arthritis   . Asthma   . Bowel obstruction ([place])   . Environmental allergies   . GERD (gastroesophageal reflux disease)   . Glaucoma 05/09/2017  . H. pylori infection   . HLD (hyperlipidemia) 01/16/2019  . Hypertension   . Iron deficiency anemia   . Osteopenia 05/26/2017  . Thyroid disease   . Urticaria 07/25/2018   Past Surgical History:  Procedure Laterality Date  . ABDOMINAL HYSTERECTOMY    . BREAST BIOPSY    . COLON SURGERY    . KNEE SURGERY    . LAPAROTOMY N/A 04/18/2015   Procedure: Exploratory laparotomy with lysis of adhesions, possible bowel resection;  Surgeon: Ralene Ok, MD;  Location: [facility];  Service: General;  Laterality: N/A;  . NASAL SINUS SURGERY    . THYROID SURGERY     Social History   Socioeconomic History  . Marital status: Widowed    Spouse name: Not on file  . Number of children: 4  . Years of education: Not on file  . Highest education level: Not on file    Occupational History  . Occupation: retired  Tobacco Use  . Smoking status: Former Smoker    Types: Cigarettes  . Smokeless tobacco: Never Used  Substance and Sexual Activity  . Alcohol use: No  . Drug use: No  . Sexual activity: Never  Other Topics Concern  . Not on file  Social History Narrative  . Not on file   Social Determinants of Health   Financial Resource Strain:   . Difficulty of Paying Living Expenses: Not on file  Food Insecurity:   . Worried About Charity fundraiser in the Last Year: Not on file  . Ran Out of Food in the Last Year: Not on file  Transportation Needs:   . Lack of Transportation (Medical): Not on file  . Lack of Transportation (Non-Medical): Not on file  Physical Activity:   . Days of Exercise per Week: Not on file  . Minutes of Exercise per Session: Not on file  Stress:   . Feeling of Stress : Not on file  Social Connections:   . Frequency of Communication with Friends and Family: Not on file  . Frequency of Social Gatherings with Friends and Family: Not on file  . Attends Religious Services: Not on file  . Active Member of Clubs or Organizations: Not on file  . Attends Archivist Meetings: Not on file  . Marital Status: Not on  file   Allergies  Allergen Reactions  . Asa Buff (Mag [Buffered Aspirin] Other (See Comments)    Excessive sweating  . Fish Allergy Other (See Comments)    Unknown reaction  . Other Other (See Comments)    Gelcaps; gel-containing capsules; extended release tablets  . Peanut-Containing Drug Products Other (See Comments)    From allergy test  . Penicillins Itching    Has patient had a PCN reaction causing immediate rash, facial/tongue/throat swelling, SOB or lightheadedness with hypotension: No Has patient had a PCN reaction causing severe rash involving mucus membranes or skin necrosis: No Has patient had a PCN reaction that required hospitalization No Has patient had a PCN reaction occurring within  the last 10 years: No If all of the above answers are "NO", then may proceed with Cephalosporin use.  . Shellfish-Derived Products Other (See Comments)    From allergy test  . Strawberry Extract Other (See Comments)    Worsened allergy symptoms   Family History  Problem Relation Age of Onset  . Diabetes Mother   . Hypertension Mother   . Hypertension Father   . Glaucoma Sister   . Glaucoma Brother   . Allergic rhinitis Neg Hx   . Angioedema Neg Hx   . Asthma Neg Hx   . Eczema Neg Hx   . Immunodeficiency Neg Hx   . Urticaria Neg Hx       Current Outpatient Medications (Cardiovascular):  .  amLODipine (NORVASC) 10 MG tablet, Take 1 tablet (10 mg total) by mouth daily. Marland Kitchen  EPINEPHrine (EPIPEN 2-PAK) 0.3 mg/0.3 mL IJ SOAJ injection, Use as directed for severe allergic reaction .  losartan (COZAAR) 100 MG tablet, TAKE 1 TABLET EVERY DAY   Current Outpatient Medications (Respiratory):  .  albuterol (PROVENTIL) (2.5 MG/3ML) 0.083% nebulizer solution, Take 2.5 mg by nebulization 2 (two) times daily as needed for wheezing or shortness of breath. .  budesonide-formoterol (SYMBICORT) 80-4.5 MCG/ACT inhaler, Inhale 2 puffs into the lungs 2 (two) times daily. Use for asthma flares 2 puffs twice a day for 2 weeks or until cough and wheeze free .  ipratropium (ATROVENT) 0.06 % nasal spray, Place 2 sprays into both nostrils 3 (three) times daily. Marland Kitchen  loratadine (CLARITIN) 10 MG tablet, Take 10 mg by mouth daily. .  montelukast (SINGULAIR) 10 MG tablet, Take 1 tablet (10 mg total) by mouth at bedtime.  Current Facility-Administered Medications (Respiratory):  .  Benralizumab SOSY 30 mg  Current Outpatient Medications (Analgesics):  .  acetaminophen (TYLENOL) 500 MG tablet, Take 500 mg by mouth daily as needed for mild pain, moderate pain, fever or headache. For pain   Current Outpatient Medications (Hematological):  .  iron polysaccharides (NU-IRON) 150 MG capsule, Take 1 capsule (150 mg  total) by mouth daily. .  vitamin B-12 (CYANOCOBALAMIN) 1000 MCG tablet, Take 1,000 mcg by mouth daily.   Current Outpatient Medications (Other):  Marland Kitchen  AMBULATORY NON FORMULARY MEDICATION, Allergy inj Subcutaneous every 8 weeks .  cholecalciferol (VITAMIN D) 1000 units tablet, Take 2,000 Units by mouth daily. Marland Kitchen  linaclotide (LINZESS) 72 MCG capsule, Take 1 capsule (72 mcg total) by mouth daily as needed. .  Magnesium Hydroxide (PHILLIPS MILK OF MAGNESIA PO), Take 1 Dose by mouth as needed. Marland Kitchen  omeprazole (PRILOSEC) 40 MG capsule, TAKE 1 CAPSULE DAILY FOR ACID REFLUX .  omeprazole (PRILOSEC) 40 MG capsule, Take 1 capsule (40 mg total) by mouth 2 (two) times daily for 14 days. .  polyethylene glycol  powder (GLYCOLAX/MIRALAX) powder, Take 17 g by mouth daily.     Past medical history, social, surgical and family history all reviewed in electronic medical record.  No pertanent information unless stated regarding to the chief complaint.   Review of Systems:  No headache, visual changes, nausea, vomiting, diarrhea, constipation, dizziness, abdominal pain, skin rash, fevers, chills, night sweats, weight loss, swollen lymph nodes, body aches, joint swelling, chest pain, shortness of breath, mood changes. POSITIVE muscle aches  Objective  There were no vitals taken for this visit.   General: No apparent distress alert and oriented x3 mood and affect normal, dressed appropriately.  HEENT: Pupils equal, extraocular movements intact  Respiratory: Patient's speak in full sentences and does not appear short of breath  Cardiovascular: No lower extremity edema, non tender, no erythema  Skin: Warm dry intact with no signs of infection or rash on extremities or on axial skeleton.  Abdomen: Soft nontender  Neuro: Cranial nerves II through XII are intact, neurovascularly intact in all extremities with 2+ DTRs and 2+ pulses.  Lymph: No lymphadenopathy of posterior or anterior cervical chain or axillae  bilaterally.  Gait normal with good balance and coordination.  MSK:  Non tender with full range of motion and good stability and symmetric strength and tone of shoulders, elbows, wrist, hip, knee and ankles bilaterally.     Impression and Recommendations:     This case required medical decision making of moderate complexity. The above documentation has been reviewed and is accurate and complete Jacqualin Combes       Note: This dictation was prepared with Dragon dictation along with smaller phrase technology. Any transcriptional errors that result from this process are unintentional.

## 2019-07-07 ENCOUNTER — Telehealth: Payer: Self-pay | Admitting: Allergy

## 2019-07-07 NOTE — Telephone Encounter (Signed)
Patients daughter called in wanting to see if there were any need for a concern for her to have the COVID shot.  She is due in clinic to get her faserna shot on 2/17.  Daughter [name] can be reached at [phone]  Patient can be reached at [phone]

## 2019-07-07 NOTE — Telephone Encounter (Signed)
Per Algorithm patient can get the vaccine this Thursday but we had to move her Fasenra injection until 07/13/2019 since it needs to be 48 between injections.

## 2019-07-08 ENCOUNTER — Ambulatory Visit: Payer: Self-pay

## 2019-07-13 ENCOUNTER — Ambulatory Visit: Payer: Self-pay

## 2019-07-21 ENCOUNTER — Ambulatory Visit: Payer: Self-pay

## 2019-07-24 ENCOUNTER — Other Ambulatory Visit: Payer: Self-pay

## 2019-07-24 ENCOUNTER — Encounter: Payer: Self-pay | Admitting: Allergy

## 2019-07-24 ENCOUNTER — Ambulatory Visit: Payer: Medicare HMO | Admitting: Allergy

## 2019-07-24 ENCOUNTER — Telehealth: Payer: Self-pay | Admitting: *Deleted

## 2019-07-24 VITALS — BP 160/90 | HR 109 | Temp 97.8°F | Resp 16 | Ht 65.0 in | Wt 178.2 lb

## 2019-07-24 DIAGNOSIS — J3089 Other allergic rhinitis: Secondary | ICD-10-CM

## 2019-07-24 DIAGNOSIS — K219 Gastro-esophageal reflux disease without esophagitis: Secondary | ICD-10-CM | POA: Diagnosis not present

## 2019-07-24 DIAGNOSIS — J4550 Severe persistent asthma, uncomplicated: Secondary | ICD-10-CM

## 2019-07-24 DIAGNOSIS — J339 Nasal polyp, unspecified: Secondary | ICD-10-CM | POA: Diagnosis not present

## 2019-07-24 NOTE — Progress Notes (Signed)
Follow-up Note  RE: Angelica [name] MRN: [id number] DOB: 1941/03/14 Date of Office Visit: 07/24/2019   History of present illness: Angelica [name] is a 79 y.o. female presenting today for follow-up of asthma, allergic rhinitis with nasal polyposis, reflux and urticaria.  She was last seen in the office on 01/28/2019 by myself.  She states she has been doing well since her last visit without any major changes, surgeries or hospitalizations.  She did states she did something recently where she tweaked her left thigh and she has been having some pain in that area.  She did go to the ED for this and she did not have any imaging consistent with fractures. In regards to her asthma she states she has been doing okay.  She is on Fasenra however there was some issue with her insurance and reapproval of her drug through [employer].  Thus she has not had her Fasenra injection that was due on February 17 yet.  We are working on reestablishing her coverage through her free drug program.  She states she is not needed to use her Symbicort nor has she required use of her albuterol. She states her allergy symptoms have been doing well with the use of her Singulair as well as taking Flonase in the morning and she will use her ipratropium nasal spray during the day for her nasal drainage control. She states her reflux is doing well with omeprazole. She has not had any further urticarial episodes. She did get her first Covid vaccine and is due for her second dose on March 15.  She tolerated her first dose well.   Review of systems: Review of Systems  Constitutional: Negative.   HENT: Negative.   Eyes: Negative.   Respiratory: Negative.   Cardiovascular: Negative.   Gastrointestinal: Negative.   Musculoskeletal: Positive for joint pain.  Skin: Negative.   Neurological: Negative.     All other systems negative unless noted above in HPI  Past medical/social/surgical/family history have been  reviewed and are unchanged unless specifically indicated below.  No changes  Medication List: Current Outpatient Medications  Medication Sig Dispense Refill  . acetaminophen (TYLENOL) 500 MG tablet Take 500 mg by mouth daily as needed for mild pain, moderate pain, fever or headache. For pain    . albuterol (PROVENTIL) (2.5 MG/3ML) 0.083% nebulizer solution Take 2.5 mg by nebulization 2 (two) times daily as needed for wheezing or shortness of breath.    . AMBULATORY NON FORMULARY MEDICATION Allergy inj Subcutaneous every 8 weeks    . amLODipine (NORVASC) 10 MG tablet Take 1 tablet (10 mg total) by mouth daily. 90 tablet 3  . cholecalciferol (VITAMIN D) 1000 units tablet Take 2,000 Units by mouth daily.    Marland Kitchen EPINEPHrine (EPIPEN 2-PAK) 0.3 mg/0.3 mL IJ SOAJ injection Use as directed for severe allergic reaction 2 Device 1  . FERREX 150 150 MG capsule Take 1 capsule by mouth once daily 90 capsule 1  . fluticasone (FLONASE) 50 MCG/ACT nasal spray     . ipratropium (ATROVENT) 0.06 % nasal spray Place 2 sprays into both nostrils 3 (three) times daily. 15 mL 3  . linaclotide (LINZESS) 72 MCG capsule Take 1 capsule (72 mcg total) by mouth daily as needed. 30 capsule 5  . loratadine (CLARITIN) 10 MG tablet Take 10 mg by mouth daily.    Marland Kitchen losartan (COZAAR) 100 MG tablet TAKE 1 TABLET EVERY DAY 90 tablet 3  . Magnesium Hydroxide (PHILLIPS MILK  OF MAGNESIA PO) Take 1 Dose by mouth as needed.    . montelukast (SINGULAIR) 10 MG tablet Take 1 tablet (10 mg total) by mouth at bedtime. 90 tablet 3  . omeprazole (PRILOSEC) 40 MG capsule TAKE 1 CAPSULE DAILY FOR ACID REFLUX 90 capsule 3  . polyethylene glycol powder (GLYCOLAX/MIRALAX) powder Take 17 g by mouth daily.    . vitamin B-12 (CYANOCOBALAMIN) 1000 MCG tablet Take 1,000 mcg by mouth daily.    . budesonide-formoterol (SYMBICORT) 80-4.5 MCG/ACT inhaler Inhale 2 puffs into the lungs 2 (two) times daily. Use for asthma flares 2 puffs twice a day for 2 weeks  or until cough and wheeze free (Patient not taking: Reported on 07/24/2019) 1 Inhaler 5   Current Facility-Administered Medications  Medication Dose Route Frequency Provider Last Rate Last Admin  . Benralizumab SOSY 30 mg  30 mg Subcutaneous Q8 Burna Forts, MD   30 mg at 05/13/19 7741     Known medication allergies: Allergies  Allergen Reactions  . Asa Buff (Mag [Buffered Aspirin] Other (See Comments)    Excessive sweating  . Fish Allergy Other (See Comments)    Unknown reaction  . Other Other (See Comments)    Gelcaps; gel-containing capsules; extended release tablets  . Peanut-Containing Drug Products Other (See Comments)    From allergy test  . Penicillins Itching    Has patient had a PCN reaction causing immediate rash, facial/tongue/throat swelling, SOB or lightheadedness with hypotension: No Has patient had a PCN reaction causing severe rash involving mucus membranes or skin necrosis: No Has patient had a PCN reaction that required hospitalization No Has patient had a PCN reaction occurring within the last 10 years: No If all of the above answers are "NO", then may proceed with Cephalosporin use.  . Shellfish-Derived Products Other (See Comments)    From allergy test  . Strawberry Extract Other (See Comments)    Worsened allergy symptoms     Physical examination: Blood pressure (!) 160/90, pulse (!) 109, temperature 97.8 F (36.6 C), temperature source Temporal, resp. rate 16, height 5\' 5"  (1.651 m), weight 178 lb 3.2 oz (80.8 kg), SpO2 98 %.  General: Alert, interactive, in no acute distress. HEENT: PERRLA, TMs pearly gray, turbinates non-edematous without discharge, post-pharynx non erythematous. Neck: Supple without lymphadenopathy. Lungs: Clear to auscultation without wheezing, rhonchi or rales. {no increased work of breathing. CV: Normal S1, S2 without murmurs. Abdomen: Nondistended, nontender. Skin: Warm and dry, without lesions or rashes. Extremities:  No  clubbing, cyanosis or edema. Neuro:   Grossly intact.  Diagnositics/Labs: None today  Assessment and plan: Asthma, severe persistent -still under good control however she is behind on her Fasenra injections due to continued enrollment in the free drug program through a [employer].  She has completed her paperwork for resubmission and we will get this sent to the company for preapproval.  We will plan to give her a sample early next week to keep her on track until her own supply comes in.  LPRD -well controlled Allergic rhinitis -doing well with the use of her nasal sprays Nasal polyposis -she will continue use of her nasal steroid spray Urticaria-she has had no further episodes  1. For asthma flares or upper respiratory infections begin Symbicort 80-2 puffs twice a day for 2 weeks or until you are cough and wheeze free.    2. Montelukast 10mg  -1 tablet 1 time per day  3.  Flonase -1 spray each nostril twice a day. Consider  nasal saline rinses as needed for a stuffy nose  4.  Omeprazole 40mg  -1 tablet 1 time per day for reflux control  5. Continue Fasenra every 8 weeks.   Come on 07/27/2019 for Fasenra sample to stay on schedule.  We will get your [employer] application into to [employer] for re-approval.    6.  If needed:   A.  Ventolin HFA 2 puffs or albuterol nebulization every 4-6 hours  B.  OTC antihistamine and OTC Mucinex DM  C. Nasal saline spray  D. Ipratropium 0.06% 2 sprays each nostril 3 times per day   7. Return to clinic in 6 months or earlier if problem  I appreciate the opportunity to take part in Brixton's care. Please do not hesitate to contact me with questions.  Sincerely,   Prudy Feeler, MD Allergy/Immunology [facility]

## 2019-07-24 NOTE — Patient Instructions (Signed)
  1. For asthma flares or upper respiratory infections begin Symbicort 80-2 puffs twice a day for 2 weeks or until you are cough and wheeze free.    2. Montelukast 10mg  -1 tablet 1 time per day  3.  Flonase -1 spray each nostril twice a day. Consider nasal saline rinses as needed for a stuffy nose  4.  Omeprazole 40mg  -1 tablet 1 time per day for reflux control  5. Continue Fasenra every 8 weeks.   Come on 07/27/2019 for Fasenra sample to stay on schedule.  We will get your AZ[employer] application into to [employer] for re-approval.    6.  If needed:   A.  Ventolin HFA 2 puffs or albuterol nebulization every 4-6 hours  B.  OTC antihistamine and OTC Mucinex DM  C. Nasal saline spray  D. Ipratropium 0.06% 2 sprays each nostril 3 times per day   7. Return to clinic in 6 months or earlier if problem

## 2019-07-24 NOTE — Telephone Encounter (Signed)
Patient's daughter came by and dropped of AZ[employer]Me paperwork for [place]'s Berna Bue and included an updated prescription card and monthly income information. She filled out everything on the forms that she was in charge of. I filled out as much as I could and had Dr. Nelva Bush sign the form and faxed it to you to ensure that everything else missing was completed correctly. Patient's daughter stated that this should have been taken care of before the beginning of the year but for some reason was not so Tequia has not had her medication since 05/13/2019. Her next Berna Bue was due 07/08/2019. Per Dr. Jake Shark will receive a sample of Fasenra on 07/27/2019 to get her through until her medication can be sorted out and ordered. Please advise if there is anything further that we need to do or if you did not receive the paperwork, I have saved her forms in the shot room for now. Thank You.

## 2019-07-27 ENCOUNTER — Other Ambulatory Visit: Payer: Self-pay

## 2019-07-27 ENCOUNTER — Ambulatory Visit (INDEPENDENT_AMBULATORY_CARE_PROVIDER_SITE_OTHER): Payer: Medicare HMO

## 2019-07-27 ENCOUNTER — Ambulatory Visit: Payer: Self-pay

## 2019-07-27 DIAGNOSIS — J4550 Severe persistent asthma, uncomplicated: Secondary | ICD-10-CM | POA: Diagnosis not present

## 2019-07-27 MED ORDER — BENRALIZUMAB 30 MG/ML ~~LOC~~ SOSY
30.0000 mg | PREFILLED_SYRINGE | Freq: Once | SUBCUTANEOUS | Status: AC
  Administered 2019-07-27: 30 mg via SUBCUTANEOUS

## 2019-07-27 NOTE — Telephone Encounter (Signed)
I had L/m for her mother to contact [place] me a couple weeks ago due to paperwork for renewal returned too late and patient needed to call in.  L/M for daughter to contact me so I can advise same

## 2019-07-27 NOTE — Telephone Encounter (Signed)
Spoke to daughter and her mother filled out app again and left in [place] to fax to [place] to reapply

## 2019-07-29 ENCOUNTER — Ambulatory Visit: Payer: Medicare HMO | Admitting: Allergy

## 2019-07-30 ENCOUNTER — Other Ambulatory Visit: Payer: Self-pay

## 2019-07-30 ENCOUNTER — Encounter: Payer: Self-pay | Admitting: Family Medicine

## 2019-07-30 ENCOUNTER — Ambulatory Visit: Payer: Medicare HMO | Admitting: Family Medicine

## 2019-07-30 DIAGNOSIS — M7989 Other specified soft tissue disorders: Secondary | ICD-10-CM | POA: Diagnosis not present

## 2019-07-30 NOTE — Progress Notes (Signed)
[facility] [address] [place] Phone: [phone] Subjective:   I Angelica [name] am serving as a Education administrator for Dr. Hulan Saas.  This visit occurred during the SARS-CoV-2 public health emergency.  Safety protocols were in place, including screening questions prior to the visit, additional usage of staff PPE, and extensive cleaning of exam room while observing appropriate contact time as indicated for disinfecting solutions.   I'm seeing this patient by the request  of:  [name], MD  CC: Leg pain follow-up  FWY:[id number]   04/07/2019 Left greater trochanteric bursitis.  Discussed icing regimen and home exercise, discussed avoiding certain activities.  Patient is to increase activity slowly over the course the next several weeks.  If no improvement will consider injection and follow-up in 4 to 8 weeks  Significant improvement.  Patient does have some lateral column overload.  Could be contributing to some of the discomfort and pain.  Discussed with patient about proper shoes, over-the-counter orthotics, which activities to do which wants to avoid.  Discussed topical anti-inflammatories and icing regimen.  Declined formal physical therapy.  Follow-up again in 4 to 6 weeks  Update 07/30/2019 Angelica [name] is a 79 y.o. female coming in with complaint of bilateral ankle pain. Ankles have some swellings. Doesn't bother her when she walks.  States she doesn't feel any pain just swelling. States the swelling started when she hurt the left hip.  Patient states that overall the left hip is doing much better.  Nothing that stopping her from activities anymore.    Past Medical History:  Diagnosis Date  . Arthritis   . Asthma   . Bowel obstruction ([place])   . Environmental allergies   . GERD (gastroesophageal reflux disease)   . Glaucoma 05/09/2017  . H. pylori infection   . HLD (hyperlipidemia) 01/16/2019  . Hypertension   . Iron deficiency  anemia   . Osteopenia 05/26/2017  . Thyroid disease   . Urticaria 07/25/2018   Past Surgical History:  Procedure Laterality Date  . ABDOMINAL HYSTERECTOMY    . BREAST BIOPSY    . COLON SURGERY    . KNEE SURGERY    . LAPAROTOMY N/A 04/18/2015   Procedure: Exploratory laparotomy with lysis of adhesions, possible bowel resection;  Surgeon: Ralene Ok, MD;  Location: [facility];  Service: General;  Laterality: N/A;  . NASAL SINUS SURGERY    . THYROID SURGERY     Social History   Socioeconomic History  . Marital status: Widowed    Spouse name: Not on file  . Number of children: 4  . Years of education: Not on file  . Highest education level: Not on file  Occupational History  . Occupation: retired  Tobacco Use  . Smoking status: Former Smoker    Types: Cigarettes  . Smokeless tobacco: Never Used  Substance and Sexual Activity  . Alcohol use: No  . Drug use: No  . Sexual activity: Never  Other Topics Concern  . Not on file  Social History Narrative  . Not on file   Social Determinants of Health   Financial Resource Strain:   . Difficulty of Paying Living Expenses:   Food Insecurity:   . Worried About Charity fundraiser in the Last Year:   . Arboriculturist in the Last Year:   Transportation Needs:   . Film/video editor (Medical):   Marland Kitchen Lack of Transportation (Non-Medical):   Physical Activity:   .  Days of Exercise per Week:   . Minutes of Exercise per Session:   Stress:   . Feeling of Stress :   Social Connections:   . Frequency of Communication with Friends and Family:   . Frequency of Social Gatherings with Friends and Family:   . Attends Religious Services:   . Active Member of Clubs or Organizations:   . Attends Archivist Meetings:   Marland Kitchen Marital Status:    Allergies  Allergen Reactions  . Asa Buff (Mag [Buffered Aspirin] Other (See Comments)    Excessive sweating  . Fish Allergy Other (See Comments)    Unknown reaction  . Other Other (See  Comments)    Gelcaps; gel-containing capsules; extended release tablets  . Peanut-Containing Drug Products Other (See Comments)    From allergy test  . Penicillins Itching    Has patient had a PCN reaction causing immediate rash, facial/tongue/throat swelling, SOB or lightheadedness with hypotension: No Has patient had a PCN reaction causing severe rash involving mucus membranes or skin necrosis: No Has patient had a PCN reaction that required hospitalization No Has patient had a PCN reaction occurring within the last 10 years: No If all of the above answers are "NO", then may proceed with Cephalosporin use.  . Shellfish-Derived Products Other (See Comments)    From allergy test  . Strawberry Extract Other (See Comments)    Worsened allergy symptoms   Family History  Problem Relation Age of Onset  . Diabetes Mother   . Hypertension Mother   . Hypertension Father   . Glaucoma Sister   . Glaucoma Brother   . Allergic rhinitis Neg Hx   . Angioedema Neg Hx   . Asthma Neg Hx   . Eczema Neg Hx   . Immunodeficiency Neg Hx   . Urticaria Neg Hx       Current Outpatient Medications (Cardiovascular):  .  amLODipine (NORVASC) 10 MG tablet, Take 1 tablet (10 mg total) by mouth daily. Marland Kitchen  EPINEPHrine (EPIPEN 2-PAK) 0.3 mg/0.3 mL IJ SOAJ injection, Use as directed for severe allergic reaction .  losartan (COZAAR) 100 MG tablet, TAKE 1 TABLET EVERY DAY   Current Outpatient Medications (Respiratory):  .  albuterol (PROVENTIL) (2.5 MG/3ML) 0.083% nebulizer solution, Take 2.5 mg by nebulization 2 (two) times daily as needed for wheezing or shortness of breath. .  budesonide-formoterol (SYMBICORT) 80-4.5 MCG/ACT inhaler, Inhale 2 puffs into the lungs 2 (two) times daily. Use for asthma flares 2 puffs twice a day for 2 weeks or until cough and wheeze free .  fluticasone (FLONASE) 50 MCG/ACT nasal spray,  .  ipratropium (ATROVENT) 0.06 % nasal spray, Place 2 sprays into both nostrils 3 (three)  times daily. Marland Kitchen  loratadine (CLARITIN) 10 MG tablet, Take 10 mg by mouth daily. .  montelukast (SINGULAIR) 10 MG tablet, Take 1 tablet (10 mg total) by mouth at bedtime.  Current Facility-Administered Medications (Respiratory):  .  Benralizumab SOSY 30 mg  Current Outpatient Medications (Analgesics):  .  acetaminophen (TYLENOL) 500 MG tablet, Take 500 mg by mouth daily as needed for mild pain, moderate pain, fever or headache. For pain   Current Outpatient Medications (Hematological):  Marland Kitchen  FERREX 150 150 MG capsule, Take 1 capsule by mouth once daily .  vitamin B-12 (CYANOCOBALAMIN) 1000 MCG tablet, Take 1,000 mcg by mouth daily.   Current Outpatient Medications (Other):  Marland Kitchen  AMBULATORY NON FORMULARY MEDICATION, Allergy inj Subcutaneous every 8 weeks .  cholecalciferol (VITAMIN D) 1000  units tablet, Take 2,000 Units by mouth daily. Marland Kitchen  linaclotide (LINZESS) 72 MCG capsule, Take 1 capsule (72 mcg total) by mouth daily as needed. .  Magnesium Hydroxide (PHILLIPS MILK OF MAGNESIA PO), Take 1 Dose by mouth as needed. Marland Kitchen  omeprazole (PRILOSEC) 40 MG capsule, TAKE 1 CAPSULE DAILY FOR ACID REFLUX .  polyethylene glycol powder (GLYCOLAX/MIRALAX) powder, Take 17 g by mouth daily.    Reviewed prior external information including notes and imaging from  primary care provider As well as notes that were available from care everywhere and other healthcare systems.  Past medical history, social, surgical and family history all reviewed in electronic medical record.  No pertanent information unless stated regarding to the chief complaint.   Review of Systems:  No headache, visual changes, nausea, vomiting, diarrhea, constipation, dizziness, abdominal pain, skin rash, fevers, chills, night sweats, weight loss, swollen lymph nodes, body aches, joint swelling, chest pain, shortness of breath, mood changes. POSITIVE muscle aches  Objective  Blood pressure (!) 150/90, pulse 89, height 5\' 5"  (1.651 m),  weight 178 lb (80.7 kg), SpO2 98 %.   General: No apparent distress alert and oriented x3 mood and affect normal, dressed appropriately.  HEENT: Pupils equal, extraocular movements intact  Respiratory: Patient's speak in full sentences and does not appear short of breath  Cardiovascular: 1+ lower extremity edema, non tender, no erythema  Skin: Warm dry intact with no signs of infection or rash on extremities or on axial skeleton.  Abdomen: Soft nontender  Neuro: Cranial nerves II through XII are intact, neurovascularly intact in all extremities with 2+ DTRs and 2+ pulses.  Lymph: No lymphadenopathy of posterior or anterior cervical chain or axillae bilaterally.  Gait normal with good balance and coordination.  MSK:  tender with mild limited range of motion multiple joints Ankle does not show any significant swelling today.  Bilaterally they seem to do well but he is wearing compression socks.  Neurovascular intact distally.  Patient has some mild limited dorsiflexion and plantarflexion noted.    Impression and Recommendations:     The above documentation has been reviewed and is accurate and complete Lyndal Pulley, DO       Note: This dictation was prepared with Dragon dictation along with smaller phrase technology. Any transcriptional errors that result from this process are unintentional.

## 2019-07-30 NOTE — Patient Instructions (Addendum)
Good to see you Continue the compression stockings After 4 pm lay on couch with feet on arm rest for 10-20 mins a day Keep wearing good shoes See me again in 2-3 months to check in

## 2019-07-30 NOTE — Assessment & Plan Note (Signed)
Swelling is now bilateral.  He seems to be more of secondary to more of a dependent edema.  Patient does respond well to compression and seems to always have good single weight at the end of the day.  Minimal swelling noted today on exam.  Discussed icing regimen and home exercises as well that I think is beneficial in proper shoes.  Follow-up again in 2 months unless worsening symptoms

## 2019-08-12 ENCOUNTER — Encounter: Payer: Self-pay | Admitting: Internal Medicine

## 2019-08-12 ENCOUNTER — Other Ambulatory Visit: Payer: Self-pay | Admitting: Internal Medicine

## 2019-08-12 ENCOUNTER — Ambulatory Visit (INDEPENDENT_AMBULATORY_CARE_PROVIDER_SITE_OTHER): Payer: Medicare HMO | Admitting: Internal Medicine

## 2019-08-12 ENCOUNTER — Other Ambulatory Visit: Payer: Self-pay

## 2019-08-12 VITALS — BP 160/92 | HR 94 | Temp 97.6°F | Ht 65.0 in | Wt 176.6 lb

## 2019-08-12 DIAGNOSIS — I10 Essential (primary) hypertension: Secondary | ICD-10-CM | POA: Diagnosis not present

## 2019-08-12 DIAGNOSIS — R739 Hyperglycemia, unspecified: Secondary | ICD-10-CM

## 2019-08-12 DIAGNOSIS — M7062 Trochanteric bursitis, left hip: Secondary | ICD-10-CM

## 2019-08-12 DIAGNOSIS — Z0001 Encounter for general adult medical examination with abnormal findings: Secondary | ICD-10-CM | POA: Diagnosis not present

## 2019-08-12 DIAGNOSIS — E559 Vitamin D deficiency, unspecified: Secondary | ICD-10-CM

## 2019-08-12 DIAGNOSIS — E538 Deficiency of other specified B group vitamins: Secondary | ICD-10-CM

## 2019-08-12 DIAGNOSIS — M7989 Other specified soft tissue disorders: Secondary | ICD-10-CM | POA: Diagnosis not present

## 2019-08-12 DIAGNOSIS — E611 Iron deficiency: Secondary | ICD-10-CM | POA: Diagnosis not present

## 2019-08-12 LAB — CBC WITH DIFFERENTIAL/PLATELET
Basophils Absolute: 0 10*3/uL (ref 0.0–0.1)
Basophils Relative: 0.5 % (ref 0.0–3.0)
Eosinophils Absolute: 0 10*3/uL (ref 0.0–0.7)
Eosinophils Relative: 0 % (ref 0.0–5.0)
HCT: 44.3 % (ref 36.0–46.0)
Hemoglobin: 14.8 g/dL (ref 12.0–15.0)
Lymphocytes Relative: 36.2 % (ref 12.0–46.0)
Lymphs Abs: 2.7 10*3/uL (ref 0.7–4.0)
MCHC: 33.5 g/dL (ref 30.0–36.0)
MCV: 91.5 fl (ref 78.0–100.0)
Monocytes Absolute: 0.4 10*3/uL (ref 0.1–1.0)
Monocytes Relative: 6.1 % (ref 3.0–12.0)
Neutro Abs: 4.2 10*3/uL (ref 1.4–7.7)
Neutrophils Relative %: 57.2 % (ref 43.0–77.0)
Platelets: 292 10*3/uL (ref 150.0–400.0)
RBC: 4.84 Mil/uL (ref 3.87–5.11)
RDW: 14 % (ref 11.5–15.5)
WBC: 7.3 10*3/uL (ref 4.0–10.5)

## 2019-08-12 LAB — URINALYSIS, ROUTINE W REFLEX MICROSCOPIC
Bilirubin Urine: NEGATIVE
Hgb urine dipstick: NEGATIVE
Nitrite: NEGATIVE
RBC / HPF: NONE SEEN (ref 0–?)
Specific Gravity, Urine: 1.01 (ref 1.000–1.030)
Total Protein, Urine: NEGATIVE
Urine Glucose: NEGATIVE
Urobilinogen, UA: 0.2 (ref 0.0–1.0)
pH: 6 (ref 5.0–8.0)

## 2019-08-12 LAB — HEPATIC FUNCTION PANEL
ALT: 10 U/L (ref 0–35)
AST: 21 U/L (ref 0–37)
Albumin: 4.4 g/dL (ref 3.5–5.2)
Alkaline Phosphatase: 103 U/L (ref 39–117)
Bilirubin, Direct: 0.2 mg/dL (ref 0.0–0.3)
Total Bilirubin: 0.5 mg/dL (ref 0.2–1.2)
Total Protein: 8.4 g/dL — ABNORMAL HIGH (ref 6.0–8.3)

## 2019-08-12 LAB — BASIC METABOLIC PANEL
BUN: 11 mg/dL (ref 6–23)
CO2: 23 mEq/L (ref 19–32)
Calcium: 10 mg/dL (ref 8.4–10.5)
Chloride: 103 mEq/L (ref 96–112)
Creatinine, Ser: 0.77 mg/dL (ref 0.40–1.20)
GFR: 87.36 mL/min (ref >60.00)
Glucose, Bld: 97 mg/dL (ref 70–99)
Potassium: 3.6 mEq/L (ref 3.5–5.1)
Sodium: 139 mEq/L (ref 135–145)

## 2019-08-12 LAB — HEMOGLOBIN A1C: Hgb A1c MFr Bld: 5.7 % (ref 4.6–6.5)

## 2019-08-12 LAB — IBC PANEL
Iron: 95 ug/dL (ref 42–145)
Saturation Ratios: 29.5 % (ref 20.0–50.0)
Transferrin: 230 mg/dL (ref 212.0–360.0)

## 2019-08-12 LAB — LIPID PANEL
Cholesterol: 223 mg/dL — ABNORMAL HIGH (ref 0–200)
HDL: 69.9 mg/dL (ref >39.00)
LDL Cholesterol: 138 mg/dL — ABNORMAL HIGH (ref 0–99)
NonHDL: 153.45
Total CHOL/HDL Ratio: 3
Triglycerides: 78 mg/dL (ref 0.0–149.0)
VLDL: 15.6 mg/dL (ref 0.0–40.0)

## 2019-08-12 LAB — VITAMIN B12: Vitamin B-12: >1500 pg/mL — ABNORMAL HIGH (ref 211–911)

## 2019-08-12 LAB — TSH: TSH: 2.64 u[IU]/mL (ref 0.35–4.50)

## 2019-08-12 LAB — VITAMIN D 25 HYDROXY (VIT D DEFICIENCY, FRACTURES): VITD: 52.1 ng/mL (ref 30.00–100.00)

## 2019-08-12 MED ORDER — TRAMADOL HCL 50 MG PO TABS: 50.0000 mg | ORAL_TABLET | Freq: Four times a day (QID) | ORAL | 2 refills | Status: DC | PRN

## 2019-08-12 MED ORDER — LOSARTAN POTASSIUM 100 MG PO TABS: 100.0000 mg | ORAL_TABLET | Freq: Every day | ORAL | 3 refills | Status: DC

## 2019-08-12 MED ORDER — PRAVASTATIN SODIUM 40 MG PO TABS: 40.0000 mg | ORAL_TABLET | Freq: Every day | ORAL | 3 refills | Status: DC

## 2019-08-12 NOTE — Assessment & Plan Note (Addendum)
To restart losartan 100 qd, stable overall by history and exam, recent data reviewed with pt, and pt to continue medical treatment as before,  to f/u any worsening symptoms or concerns

## 2019-08-12 NOTE — Assessment & Plan Note (Signed)
stable overall by history and exam, recent data reviewed with pt, and pt to continue medical treatment as before,  to f/u any worsening symptoms or concerns  

## 2019-08-12 NOTE — Assessment & Plan Note (Signed)

## 2019-08-12 NOTE — Patient Instructions (Signed)
Ok to restart the losartan 100 mg per day  Please take all new medication as prescribed - the tramadol as needed  Please continue all other medications as before, and refills have been done if requested.  Please have the pharmacy call with any other refills you may need.  Please continue your efforts at being more active, low cholesterol diet, and weight control.  You are otherwise up to date with prevention measures today.  Please keep your appointments with your specialists as you may have planned - Dr Tamala Julian  Please go to the LAB at the blood drawing area for the tests to be done  You will be contacted by phone if any changes need to be made immediately.  Otherwise, you will receive a letter about your results with an explanation, but please check with MyChart first.  Please remember to sign up for MyChart if you have not done so, as this will be important to you in the future with finding out test results, communicating by private email, and scheduling acute appointments online when needed.  Please make an Appointment to return in 3 months, or sooner if needed

## 2019-08-12 NOTE — Assessment & Plan Note (Signed)
C/w venous insufficiency, improved,  to f/u any worsening symptoms or concerns

## 2019-08-12 NOTE — Assessment & Plan Note (Addendum)
Moderate, for tramadol prn, f/u sport med  I spent 32 minutes in preparing to see the patient by review of recent labs, imaging and procedures, obtaining and reviewing separately obtained history, communicating with the patient and family or caregiver, ordering medications, tests or procedures, and documenting clinical information in the EHR including the differential Dx, treatment, and any further evaluation and other management of left hip bursitis, htn, hyperglycemia, leg swelling

## 2019-08-12 NOTE — Progress Notes (Signed)
Subjective:    Patient ID: Angelica [name], female    DOB: 04/03/1941, 79 y.o.   MRN: [id number]  HPI  Here for wellness and f/u;  Overall doing ok;  Pt denies Chest pain, worsening SOB, DOE, wheezing, orthopnea, PND, worsening LE edema, palpitations, dizziness or syncope.  Pt denies neurological change such as new headache, facial or extremity weakness.  Pt denies polydipsia, polyuria, or low sugar symptoms. Pt states overall good compliance with treatment and medications, good tolerability, and has been trying to follow appropriate diet.  Pt denies worsening depressive symptoms, suicidal ideation or panic. No fever, night sweats, wt loss, loss of appetite, or other constitutional symptoms.  Pt states good ability with ADL's, has low fall risk, home safety reviewed and adequate, no other significant changes in hearing or vision, and only occasionally active with exercise. BP Readings from Last 3 Encounters:  08/12/19 (!) 160/92  07/30/19 (!) 150/90  07/24/19 (!) 160/90  also with tender left deltoid insertion site, also with tender over left lateral hip area worse to lie on at night as well.  Has ongoing venous insuffieincy, this am with trace left pedal edema only Past Medical History:  Diagnosis Date  . Arthritis   . Asthma   . Bowel obstruction ([place])   . Environmental allergies   . GERD (gastroesophageal reflux disease)   . Glaucoma 05/09/2017  . H. pylori infection   . HLD (hyperlipidemia) 01/16/2019  . Hypertension   . Iron deficiency anemia   . Osteopenia 05/26/2017  . Thyroid disease   . Urticaria 07/25/2018   Past Surgical History:  Procedure Laterality Date  . ABDOMINAL HYSTERECTOMY    . BREAST BIOPSY    . COLON SURGERY    . KNEE SURGERY    . LAPAROTOMY N/A 04/18/2015   Procedure: Exploratory laparotomy with lysis of adhesions, possible bowel resection;  Surgeon: Ralene Ok, MD;  Location: [facility];  Service: General;  Laterality: N/A;  . NASAL SINUS SURGERY    . THYROID  SURGERY      reports that she has quit smoking. Her smoking use included cigarettes. She has never used smokeless tobacco. She reports that she does not drink alcohol or use drugs. family history includes Diabetes in her mother; Glaucoma in her brother and sister; Hypertension in her father and mother. Allergies  Allergen Reactions  . Asa Buff (Mag [Buffered Aspirin] Other (See Comments)    Excessive sweating  . Fish Allergy Other (See Comments)    Unknown reaction  . Other Other (See Comments)    Gelcaps; gel-containing capsules; extended release tablets  . Peanut-Containing Drug Products Other (See Comments)    From allergy test  . Penicillins Itching    Has patient had a PCN reaction causing immediate rash, facial/tongue/throat swelling, SOB or lightheadedness with hypotension: No Has patient had a PCN reaction causing severe rash involving mucus membranes or skin necrosis: No Has patient had a PCN reaction that required hospitalization No Has patient had a PCN reaction occurring within the last 10 years: No If all of the above answers are "NO", then may proceed with Cephalosporin use.  . Shellfish-Derived Products Other (See Comments)    From allergy test  . Strawberry Extract Other (See Comments)    Worsened allergy symptoms   Review of Systems All otherwise neg per pt     Objective:   Physical Exam BP (!) 160/92   Pulse 94   Temp 97.6 F (36.4 C)   Ht 5'  5" (1.651 m)   Wt 176 lb 9.6 oz (80.1 kg)   SpO2 99%   BMI 29.39 kg/m  VS noted,  Constitutional: Pt appears in NAD HENT: Head: NCAT.  Right Ear: External ear normal.  Left Ear: External ear normal.  Eyes: . Pupils are equal, round, and reactive to light. Conjunctivae and EOM are normal Nose: without d/c or deformity Neck: Neck supple. Gross normal ROM Cardiovascular: Normal rate and regular rhythm.   Pulmonary/Chest: Effort normal and breath sounds without rales or wheezing.  Abd:  Soft, NT, ND, + BS, no  organomegaly Tender left deltoid insertion site, also tender over left lateral greater trochanter Neurological: Pt is alert. At baseline orientation, motor grossly intact Skin: Skin is warm. No rashes, other new lesions, trace Left pedal edema Psychiatric: Pt behavior is normal without agitation  All otherwise neg per pt Lab Results  Component Value Date   WBC 7.3 08/12/2019   HGB 14.8 08/12/2019   HCT 44.3 08/12/2019   PLT 292.0 08/12/2019   GLUCOSE 97 08/12/2019   CHOL 223 (H) 08/12/2019   TRIG 78.0 08/12/2019   HDL 69.90 08/12/2019   LDLCALC 138 (H) 08/12/2019   ALT 10 08/12/2019   AST 21 08/12/2019   NA 139 08/12/2019   K 3.6 08/12/2019   CL 103 08/12/2019   CREATININE 0.77 08/12/2019   BUN 11 08/12/2019   CO2 23 08/12/2019   TSH 2.64 08/12/2019   HGBA1C 5.7 08/12/2019      Assessment & Plan:

## 2019-08-13 ENCOUNTER — Telehealth: Payer: Self-pay

## 2019-08-13 NOTE — Telephone Encounter (Signed)
Key: [id number]

## 2019-08-14 NOTE — Telephone Encounter (Signed)
PA approved and pt informed of same.

## 2019-08-25 ENCOUNTER — Other Ambulatory Visit: Payer: Self-pay

## 2019-08-27 DIAGNOSIS — H40023 Open angle with borderline findings, high risk, bilateral: Secondary | ICD-10-CM | POA: Diagnosis not present

## 2019-08-27 DIAGNOSIS — H26493 Other secondary cataract, bilateral: Secondary | ICD-10-CM | POA: Diagnosis not present

## 2019-08-27 DIAGNOSIS — H04123 Dry eye syndrome of bilateral lacrimal glands: Secondary | ICD-10-CM | POA: Diagnosis not present

## 2019-08-27 DIAGNOSIS — Z961 Presence of intraocular lens: Secondary | ICD-10-CM | POA: Diagnosis not present

## 2019-08-27 LAB — HM DIABETES EYE EXAM

## 2019-09-17 ENCOUNTER — Encounter: Payer: Self-pay | Admitting: Internal Medicine

## 2019-09-22 ENCOUNTER — Ambulatory Visit (INDEPENDENT_AMBULATORY_CARE_PROVIDER_SITE_OTHER): Payer: Medicare HMO

## 2019-09-22 ENCOUNTER — Other Ambulatory Visit: Payer: Self-pay

## 2019-09-22 DIAGNOSIS — J4550 Severe persistent asthma, uncomplicated: Secondary | ICD-10-CM | POA: Diagnosis not present

## 2019-09-29 ENCOUNTER — Ambulatory Visit: Payer: Medicare HMO | Admitting: Family Medicine

## 2019-09-29 ENCOUNTER — Other Ambulatory Visit: Payer: Self-pay

## 2019-09-29 ENCOUNTER — Ambulatory Visit (INDEPENDENT_AMBULATORY_CARE_PROVIDER_SITE_OTHER): Payer: Medicare HMO

## 2019-09-29 ENCOUNTER — Encounter: Payer: Self-pay | Admitting: Family Medicine

## 2019-09-29 VITALS — BP 146/80 | HR 70 | Ht 65.0 in | Wt 180.0 lb

## 2019-09-29 DIAGNOSIS — M25512 Pain in left shoulder: Secondary | ICD-10-CM | POA: Diagnosis not present

## 2019-09-29 DIAGNOSIS — G8929 Other chronic pain: Secondary | ICD-10-CM | POA: Diagnosis not present

## 2019-09-29 DIAGNOSIS — S4992XA Unspecified injury of left shoulder and upper arm, initial encounter: Secondary | ICD-10-CM | POA: Diagnosis not present

## 2019-09-29 IMAGING — DX DG SHOULDER 2+V*L*
3 series · 3 of 3 positions shown · non-contrast
Comparison: None

CLINICAL DATA: Fell 2 months ago, LEFT shoulder pain since

EXAM:
LEFT SHOULDER - 2+ VIEW

[shoulder (grashey view) ap]
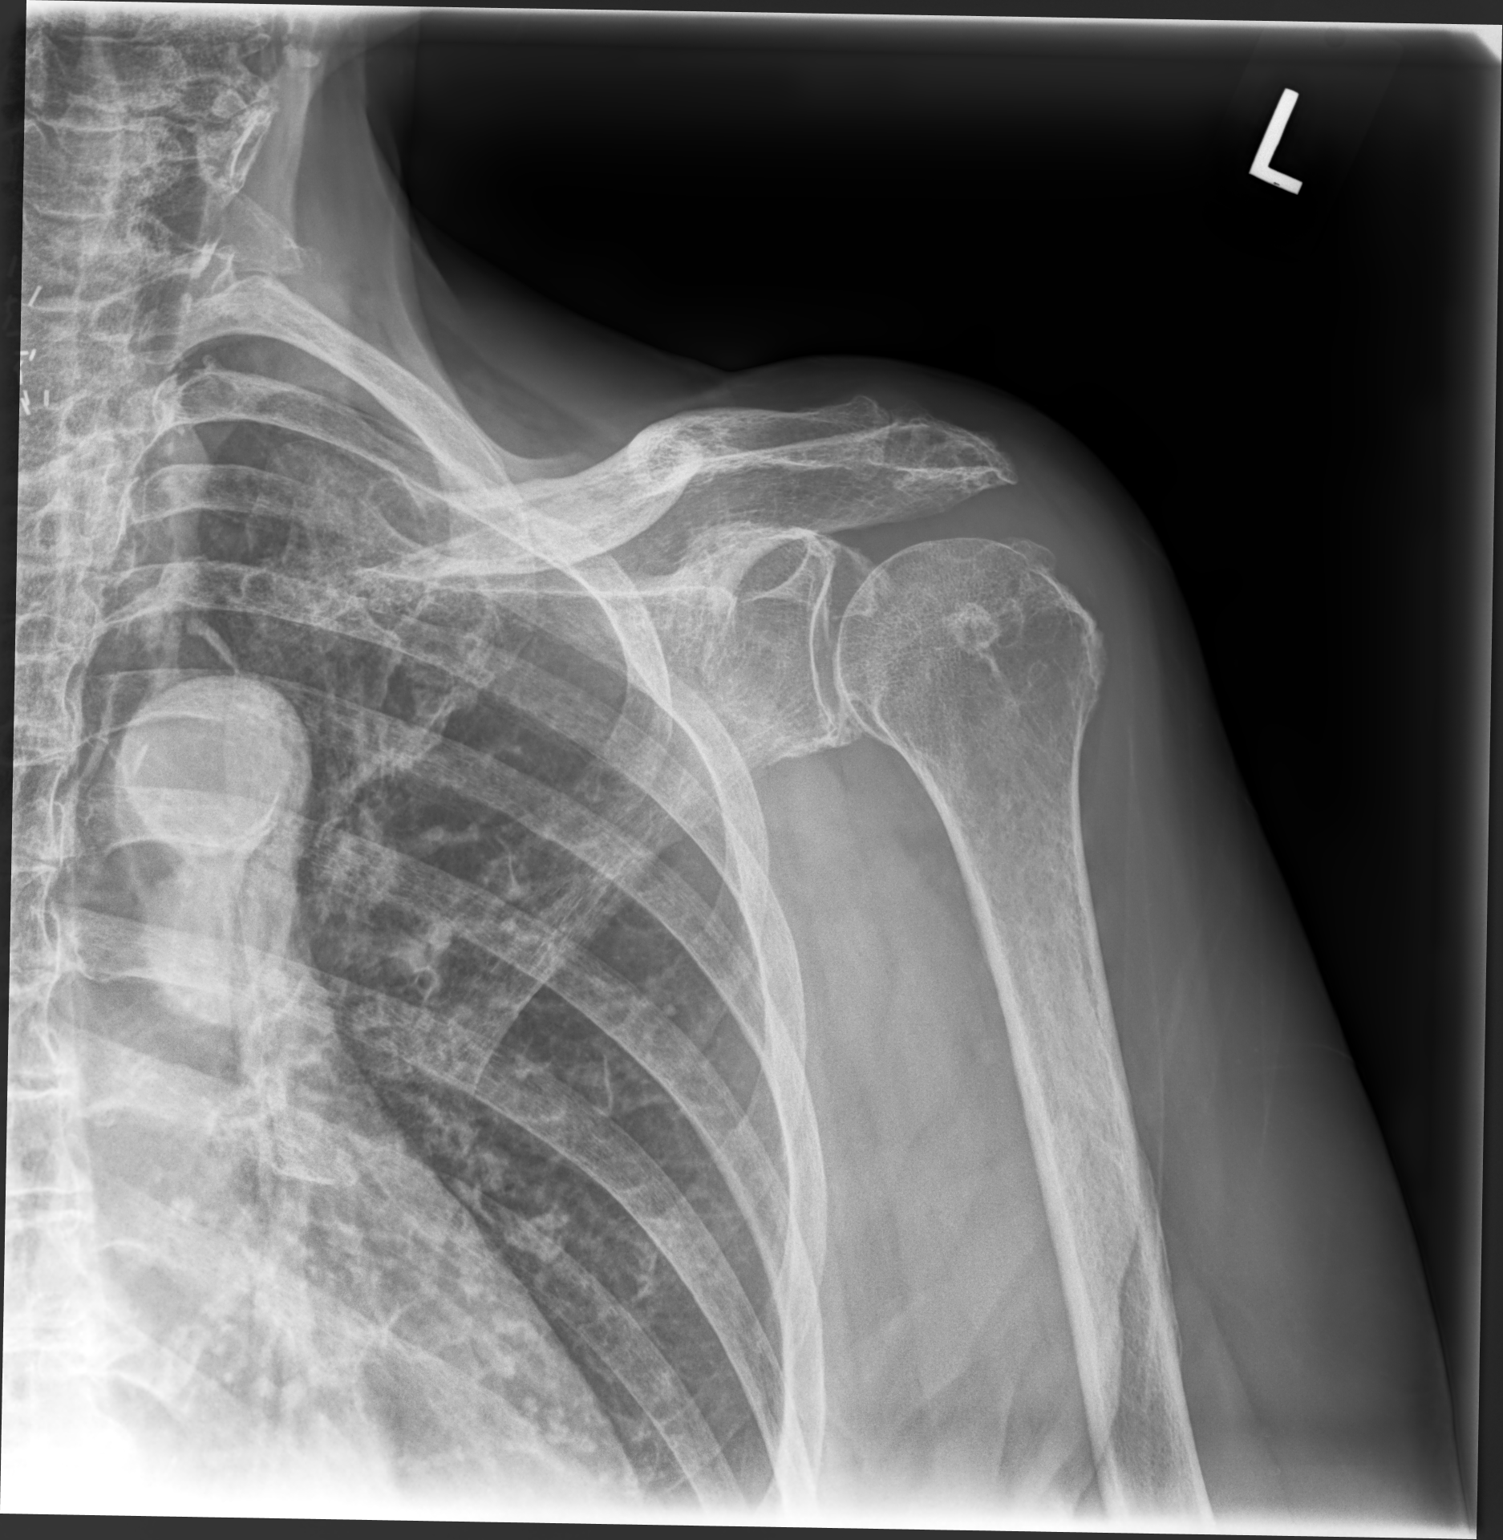

[shoulder (y view) lat]
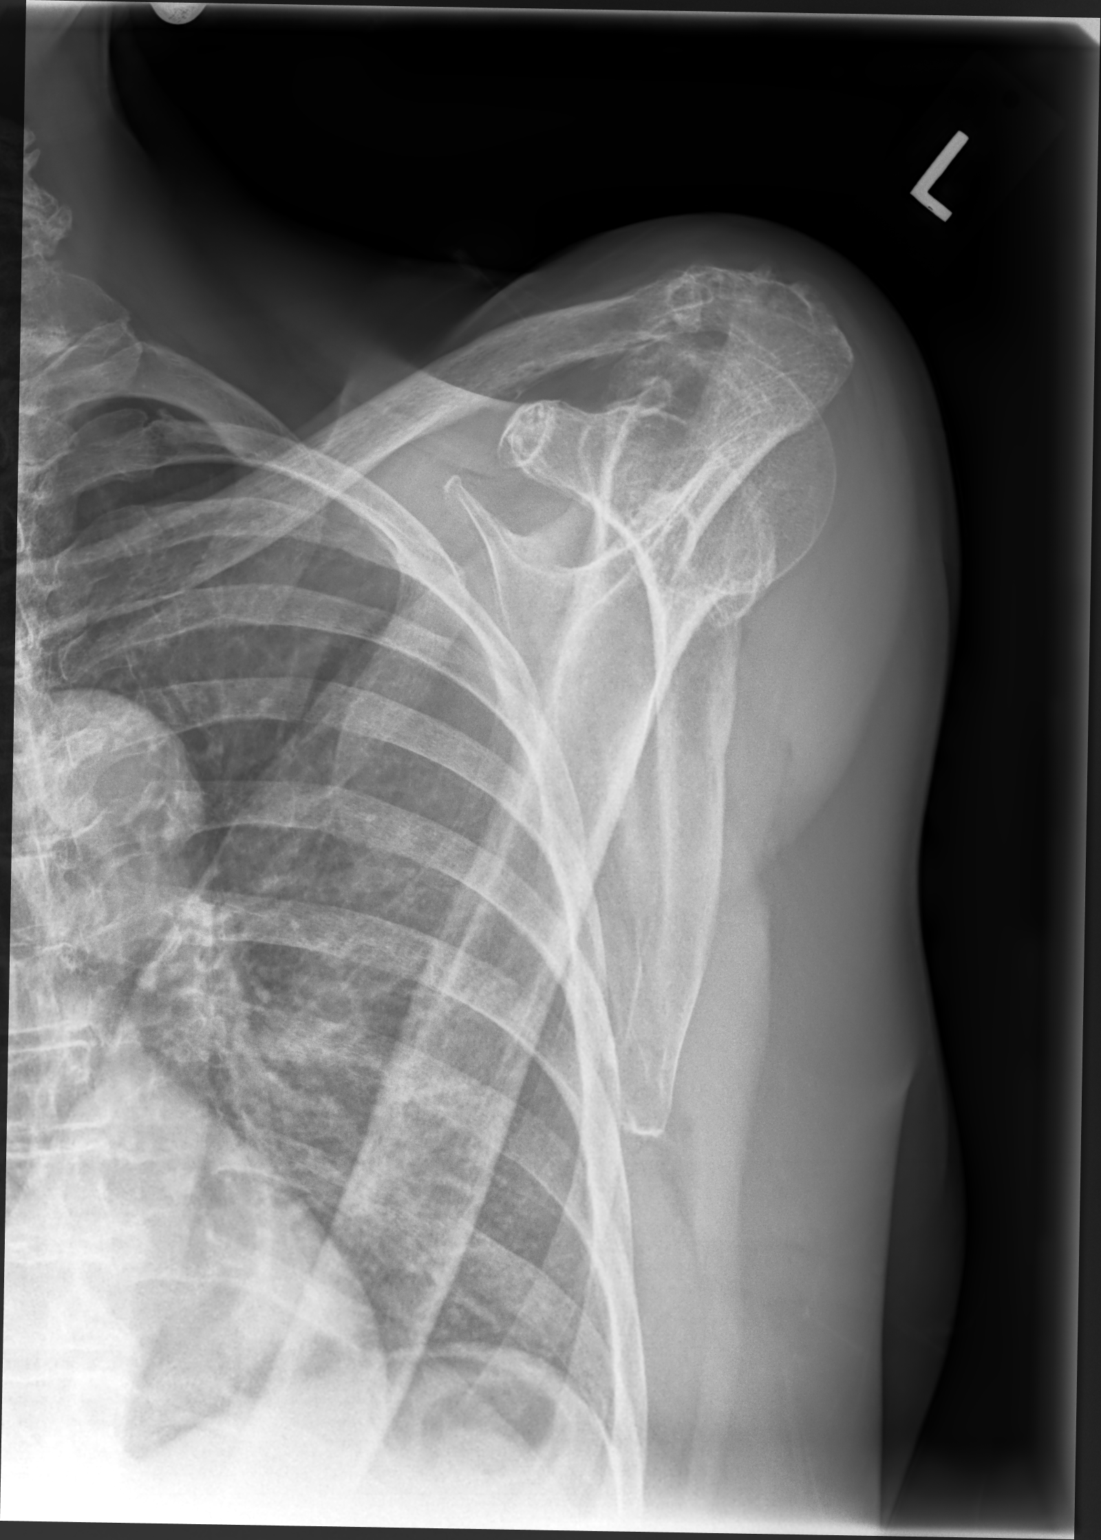

[shoulder axial 45°]
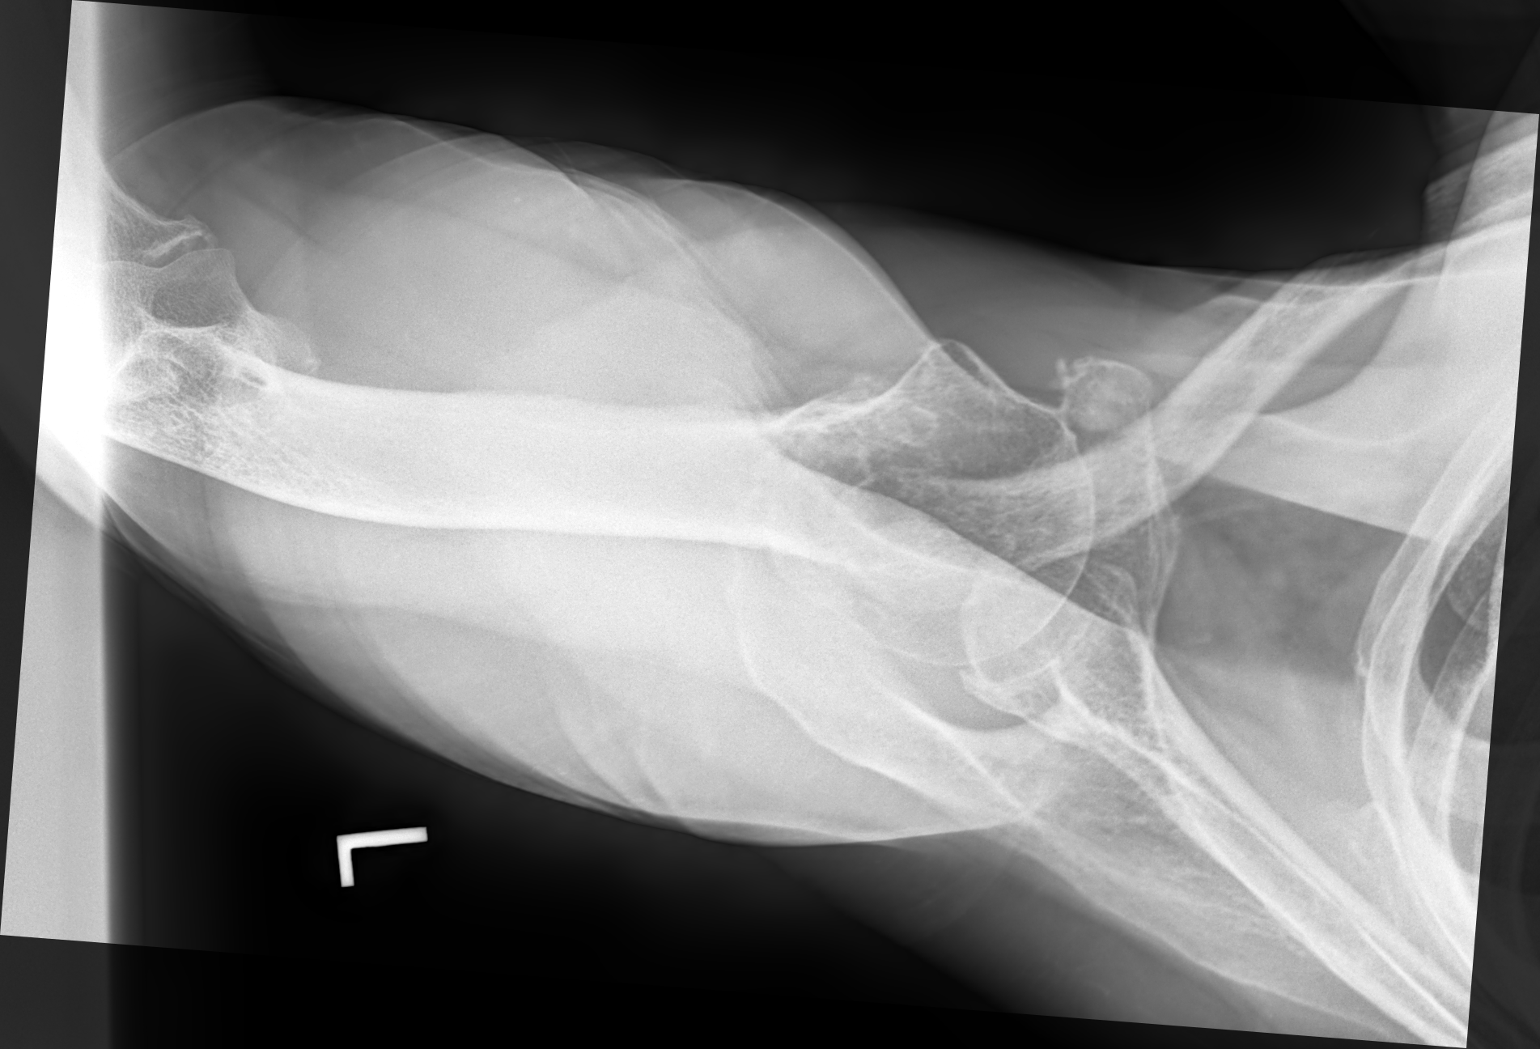

[3 of 3 positions shown; findings below may reference images not displayed]

FINDINGS: Osseous demineralization.

Degenerative changes LEFT AC joint.

Visualized LEFT ribs intact.

No acute fracture, dislocation, or bone destruction.

Atherosclerotic calcification aorta.
IMPRESSION: Osseous demineralization with degenerative changes of LEFT AC joint.

No acute abnormalities.

## 2019-09-29 NOTE — Progress Notes (Signed)
[facility] [address] [place] Phone: [phone] Subjective:   I Kandace Blitz am serving as a Education administrator for Dr. Hulan Saas.  This visit occurred during the SARS-CoV-2 public health emergency.  Safety protocols were in place, including screening questions prior to the visit, additional usage of staff PPE, and extensive cleaning of exam room while observing appropriate contact time as indicated for disinfecting solutions.   I'm seeing this patient by the request  of:  [name], MD  CC: Lower extremity swelling follow-up  [id number]   07/30/2019 Swelling is now bilateral.  He seems to be more of secondary to more of a dependent edema.  Patient does respond well to compression and seems to always have good single weight at the end of the day.  Minimal swelling noted today on exam.  Discussed icing regimen and home exercises as well that I think is beneficial in proper shoes.  Follow-up again in 2 months unless worsening symptoms  09/29/2019 Angelica [name] is a 79 y.o. female coming in with complaint of lower extremity swelling. Left leg is most painful. States she believes she is making progress with the right leg.  Patient is actually complaining more of a left shoulder pain than anything else today.  Patient states that seem to give more difficulty with this as well.  States that it gives her trouble and in the morning difficult to sleep with it.  States that certain movements causes a sharp pain but is able to do all movements.    Past Medical History:  Diagnosis Date  . Arthritis   . Asthma   . Bowel obstruction ([place])   . Environmental allergies   . GERD (gastroesophageal reflux disease)   . Glaucoma 05/09/2017  . H. pylori infection   . HLD (hyperlipidemia) 01/16/2019  . Hypertension   . Iron deficiency anemia   . Osteopenia 05/26/2017  . Thyroid disease   . Urticaria 07/25/2018   Past Surgical History:  Procedure  Laterality Date  . ABDOMINAL HYSTERECTOMY    . BREAST BIOPSY    . COLON SURGERY    . KNEE SURGERY    . LAPAROTOMY N/A 04/18/2015   Procedure: Exploratory laparotomy with lysis of adhesions, possible bowel resection;  Surgeon: Ralene Ok, MD;  Location: [facility];  Service: General;  Laterality: N/A;  . NASAL SINUS SURGERY    . THYROID SURGERY     Social History   Socioeconomic History  . Marital status: Widowed    Spouse name: Not on file  . Number of children: 4  . Years of education: Not on file  . Highest education level: Not on file  Occupational History  . Occupation: retired  Tobacco Use  . Smoking status: Former Smoker    Types: Cigarettes  . Smokeless tobacco: Never Used  Substance and Sexual Activity  . Alcohol use: No  . Drug use: No  . Sexual activity: Never  Other Topics Concern  . Not on file  Social History Narrative  . Not on file   Social Determinants of Health   Financial Resource Strain:   . Difficulty of Paying Living Expenses:   Food Insecurity:   . Worried About Charity fundraiser in the Last Year:   . Arboriculturist in the Last Year:   Transportation Needs:   . Film/video editor (Medical):   Marland Kitchen Lack of Transportation (Non-Medical):   Physical Activity:   . Days  of Exercise per Week:   . Minutes of Exercise per Session:   Stress:   . Feeling of Stress :   Social Connections:   . Frequency of Communication with Friends and Family:   . Frequency of Social Gatherings with Friends and Family:   . Attends Religious Services:   . Active Member of Clubs or Organizations:   . Attends Archivist Meetings:   Marland Kitchen Marital Status:    Allergies  Allergen Reactions  . Asa Buff (Mag [Buffered Aspirin] Other (See Comments)    Excessive sweating  . Fish Allergy Other (See Comments)    Unknown reaction  . Other Other (See Comments)    Gelcaps; gel-containing capsules; extended release tablets  . Peanut-Containing Drug Products Other  (See Comments)    From allergy test  . Penicillins Itching    Has patient had a PCN reaction causing immediate rash, facial/tongue/throat swelling, SOB or lightheadedness with hypotension: No Has patient had a PCN reaction causing severe rash involving mucus membranes or skin necrosis: No Has patient had a PCN reaction that required hospitalization No Has patient had a PCN reaction occurring within the last 10 years: No If all of the above answers are "NO", then may proceed with Cephalosporin use.  . Shellfish-Derived Products Other (See Comments)    From allergy test  . Strawberry Extract Other (See Comments)    Worsened allergy symptoms   Family History  Problem Relation Age of Onset  . Diabetes Mother   . Hypertension Mother   . Hypertension Father   . Glaucoma Sister   . Glaucoma Brother   . Allergic rhinitis Neg Hx   . Angioedema Neg Hx   . Asthma Neg Hx   . Eczema Neg Hx   . Immunodeficiency Neg Hx   . Urticaria Neg Hx       Current Outpatient Medications (Cardiovascular):  .  amLODipine (NORVASC) 10 MG tablet, Take 1 tablet (10 mg total) by mouth daily. Marland Kitchen  EPINEPHrine (EPIPEN 2-PAK) 0.3 mg/0.3 mL IJ SOAJ injection, Use as directed for severe allergic reaction .  losartan (COZAAR) 100 MG tablet, Take 1 tablet (100 mg total) by mouth daily. .  pravastatin (PRAVACHOL) 40 MG tablet, Take 1 tablet (40 mg total) by mouth daily.   Current Outpatient Medications (Respiratory):  .  albuterol (PROVENTIL) (2.5 MG/3ML) 0.083% nebulizer solution, Take 2.5 mg by nebulization 2 (two) times daily as needed for wheezing or shortness of breath. .  budesonide-formoterol (SYMBICORT) 80-4.5 MCG/ACT inhaler, Inhale 2 puffs into the lungs 2 (two) times daily. Use for asthma flares 2 puffs twice a day for 2 weeks or until cough and wheeze free .  fluticasone (FLONASE) 50 MCG/ACT nasal spray,  .  ipratropium (ATROVENT) 0.06 % nasal spray, Place 2 sprays into both nostrils 3 (three) times  daily. Marland Kitchen  loratadine (CLARITIN) 10 MG tablet, Take 10 mg by mouth daily. .  montelukast (SINGULAIR) 10 MG tablet, Take 1 tablet (10 mg total) by mouth at bedtime.  Current Facility-Administered Medications (Respiratory):  .  Benralizumab SOSY 30 mg  Current Outpatient Medications (Analgesics):  .  acetaminophen (TYLENOL) 500 MG tablet, Take 500 mg by mouth daily as needed for mild pain, moderate pain, fever or headache. For pain .  traMADol (ULTRAM) 50 MG tablet, Take 1 tablet (50 mg total) by mouth every 6 (six) hours as needed.   Current Outpatient Medications (Hematological):  Marland Kitchen  FERREX 150 150 MG capsule, Take 1 capsule by mouth once  daily .  vitamin B-12 (CYANOCOBALAMIN) 1000 MCG tablet, Take 1,000 mcg by mouth daily.   Current Outpatient Medications (Other):  Marland Kitchen  AMBULATORY NON FORMULARY MEDICATION, Allergy inj Subcutaneous every 8 weeks .  cholecalciferol (VITAMIN D) 1000 units tablet, Take 2,000 Units by mouth daily. Marland Kitchen  linaclotide (LINZESS) 72 MCG capsule, Take 1 capsule (72 mcg total) by mouth daily as needed. .  Magnesium Hydroxide (PHILLIPS MILK OF MAGNESIA PO), Take 1 Dose by mouth as needed. Marland Kitchen  omeprazole (PRILOSEC) 40 MG capsule, TAKE 1 CAPSULE DAILY FOR ACID REFLUX .  polyethylene glycol powder (GLYCOLAX/MIRALAX) powder, Take 17 g by mouth daily.    Reviewed prior external information including notes and imaging from  primary care provider As well as notes that were available from care everywhere and other healthcare systems.  Past medical history, social, surgical and family history all reviewed in electronic medical record.  No pertanent information unless stated regarding to the chief complaint.   Review of Systems:  No headache, visual changes, nausea, vomiting, diarrhea, constipation, dizziness, abdominal pain, skin rash, fevers, chills, night sweats, weight loss, swollen lymph nodes, body aches, joint swelling, chest pain, shortness of breath, mood changes.  POSITIVE muscle aches  Objective  Blood pressure (!) 146/80, pulse 70, height 5\' 5"  (1.651 m), weight 180 lb (81.6 kg), SpO2 96 %.   General: No apparent distress alert and oriented x3 mood and affect normal, dressed appropriately.  HEENT: Pupils equal, extraocular movements intact  Respiratory: Patient's speak in full sentences and does not appear short of breath  Cardiovascular: 1+lower extremity edema, non tender, no erythema  Neuro: Cranial nerves II through XII are intact, neurovascularly intact in all extremities with 2+ DTRs and 2+ pulses.  Gait mild antalgic  MSK:   Left shoulder exam shows some mild atrophy.  Patient does have voluntary guarding with active range of motion at 85 degrees but full passive range of motion.  Mild crepitus noted.  Mild positive crossover.  Mild positive impingement with Neer's and Hawkins  After informed written and verbal consent, patient was seated on exam table. Left shoulder was prepped with alcohol swab and utilizing posterior approach, patient's right glenohumeral space was injected with 4:1  marcaine 0.5%: Kenalog 40mg /dL. Patient tolerated the procedure well without immediate complications.    Impression and Recommendations:     This case required medical decision making of moderate complexity. The above documentation has been reviewed and is accurate and complete Lyndal Pulley, DO       Note: This dictation was prepared with Dragon dictation along with smaller phrase technology. Any transcriptional errors that result from this process are unintentional.

## 2019-09-29 NOTE — Patient Instructions (Addendum)
COQ10 200 mg with statin  Xray on the left shoulder today Exercise 3 times a week Pennsaid small amount over painful spot twice a day Ice 2 times a day for 20 mins  See me again in 6 weeks

## 2019-09-29 NOTE — Assessment & Plan Note (Signed)
Patient states fall greater than 3 months ago seem to aggravate it.  Patient does have good rotator cuff strength so do not think that there is a large tear.  Injection given today for more of a bursitis.  Discussed icing regimen and home exercise, which activities to do which wants to avoid.  Patient work with Product/process development scientist.  Topical anti-inflammatories given secondary to patient's age we will try to avoid possible.  Worsening symptoms will consider formal physical therapy.  X-rays pending

## 2019-11-12 ENCOUNTER — Other Ambulatory Visit: Payer: Self-pay

## 2019-11-12 ENCOUNTER — Ambulatory Visit: Payer: Medicare HMO | Admitting: Family Medicine

## 2019-11-12 ENCOUNTER — Encounter: Payer: Self-pay | Admitting: Family Medicine

## 2019-11-12 ENCOUNTER — Ambulatory Visit: Payer: Self-pay

## 2019-11-12 VITALS — BP 150/82 | HR 74 | Ht 65.0 in | Wt 177.0 lb

## 2019-11-12 DIAGNOSIS — G8929 Other chronic pain: Secondary | ICD-10-CM | POA: Diagnosis not present

## 2019-11-12 DIAGNOSIS — M25512 Pain in left shoulder: Secondary | ICD-10-CM

## 2019-11-12 DIAGNOSIS — S46012D Strain of muscle(s) and tendon(s) of the rotator cuff of left shoulder, subsequent encounter: Secondary | ICD-10-CM | POA: Diagnosis not present

## 2019-11-12 DIAGNOSIS — M65342 Trigger finger, left ring finger: Secondary | ICD-10-CM | POA: Insufficient documentation

## 2019-11-12 DIAGNOSIS — M65311 Trigger thumb, right thumb: Secondary | ICD-10-CM | POA: Diagnosis not present

## 2019-11-12 DIAGNOSIS — M75102 Unspecified rotator cuff tear or rupture of left shoulder, not specified as traumatic: Secondary | ICD-10-CM | POA: Insufficient documentation

## 2019-11-12 MED ORDER — IPRATROPIUM BROMIDE 0.06 % NA SOLN: 2.0000 | Freq: Three times a day (TID) | NASAL | 3 refills | Status: DC

## 2019-11-12 NOTE — Patient Instructions (Signed)
PT [address] See me again in 6-7 weeks

## 2019-11-12 NOTE — Assessment & Plan Note (Signed)
Patient given injection today.  Bracing at night, patient should do well with conservative therapy

## 2019-11-12 NOTE — Progress Notes (Signed)
[facility] [address] [place] Phone: [phone] Subjective:   I Angelica [name] am serving as a Education administrator for Dr. Hulan Saas.  This visit occurred during the SARS-CoV-2 public health emergency.  Safety protocols were in place, including screening questions prior to the visit, additional usage of staff PPE, and extensive cleaning of exam room while observing appropriate contact time as indicated for disinfecting solutions.   I'm seeing this patient by the request  of:  [name], MD  CC: Hand pain, follow-up shoulder pain  [id number]   09/29/2019 Patient states fall greater than 3 months ago seem to aggravate it.  Patient does have good rotator cuff strength so do not think that there is a large tear.  Injection given today for more of a bursitis.  Discussed icing regimen and home exercise, which activities to do which wants to avoid.  Patient work with Product/process development scientist.  Topical anti-inflammatories given secondary to patient's age we will try to avoid possible.  Worsening symptoms will consider formal physical therapy.  X-rays pending  Update 11/12/2019 Angelica [name] is a 79 y.o. female coming in with complaint of left shoulder pain. Patient states she has intermitted pain. Can't lift heavy objects. Sore. Foot is doing well. Swelling less. Right 5th toe bunion and states she believes her shoes are too tight. Joints in the fingers are stiff. Left ring finger and right thumb.      Past Medical History:  Diagnosis Date  . Arthritis   . Asthma   . Bowel obstruction ([place])   . Environmental allergies   . GERD (gastroesophageal reflux disease)   . Glaucoma 05/09/2017  . H. pylori infection   . HLD (hyperlipidemia) 01/16/2019  . Hypertension   . Iron deficiency anemia   . Osteopenia 05/26/2017  . Thyroid disease   . Urticaria 07/25/2018   Past Surgical History:  Procedure Laterality Date  . ABDOMINAL HYSTERECTOMY    . BREAST  BIOPSY    . COLON SURGERY    . KNEE SURGERY    . LAPAROTOMY N/A 04/18/2015   Procedure: Exploratory laparotomy with lysis of adhesions, possible bowel resection;  Surgeon: Ralene Ok, MD;  Location: [facility];  Service: General;  Laterality: N/A;  . NASAL SINUS SURGERY    . THYROID SURGERY     Social History   Socioeconomic History  . Marital status: Widowed    Spouse name: Not on file  . Number of children: 4  . Years of education: Not on file  . Highest education level: Not on file  Occupational History  . Occupation: retired  Tobacco Use  . Smoking status: Former Smoker    Types: Cigarettes  . Smokeless tobacco: Never Used  Vaping Use  . Vaping Use: Never used  Substance and Sexual Activity  . Alcohol use: No  . Drug use: No  . Sexual activity: Never  Other Topics Concern  . Not on file  Social History Narrative  . Not on file   Social Determinants of Health   Financial Resource Strain:   . Difficulty of Paying Living Expenses:   Food Insecurity:   . Worried About Charity fundraiser in the Last Year:   . Arboriculturist in the Last Year:   Transportation Needs:   . Film/video editor (Medical):   Marland Kitchen Lack of Transportation (Non-Medical):   Physical Activity:   . Days of Exercise per Week:   . Minutes  of Exercise per Session:   Stress:   . Feeling of Stress :   Social Connections:   . Frequency of Communication with Friends and Family:   . Frequency of Social Gatherings with Friends and Family:   . Attends Religious Services:   . Active Member of Clubs or Organizations:   . Attends Archivist Meetings:   Marland Kitchen Marital Status:    Allergies  Allergen Reactions  . Asa Buff (Mag [Buffered Aspirin] Other (See Comments)    Excessive sweating  . Fish Allergy Other (See Comments)    Unknown reaction  . Other Other (See Comments)    Gelcaps; gel-containing capsules; extended release tablets  . Peanut-Containing Drug Products Other (See Comments)     From allergy test  . Penicillins Itching    Has patient had a PCN reaction causing immediate rash, facial/tongue/throat swelling, SOB or lightheadedness with hypotension: No Has patient had a PCN reaction causing severe rash involving mucus membranes or skin necrosis: No Has patient had a PCN reaction that required hospitalization No Has patient had a PCN reaction occurring within the last 10 years: No If all of the above answers are "NO", then may proceed with Cephalosporin use.  . Shellfish-Derived Products Other (See Comments)    From allergy test  . Strawberry Extract Other (See Comments)    Worsened allergy symptoms   Family History  Problem Relation Age of Onset  . Diabetes Mother   . Hypertension Mother   . Hypertension Father   . Glaucoma Sister   . Glaucoma Brother   . Allergic rhinitis Neg Hx   . Angioedema Neg Hx   . Asthma Neg Hx   . Eczema Neg Hx   . Immunodeficiency Neg Hx   . Urticaria Neg Hx       Current Outpatient Medications (Cardiovascular):  .  amLODipine (NORVASC) 10 MG tablet, Take 1 tablet (10 mg total) by mouth daily. Marland Kitchen  EPINEPHrine (EPIPEN 2-PAK) 0.3 mg/0.3 mL IJ SOAJ injection, Use as directed for severe allergic reaction .  losartan (COZAAR) 100 MG tablet, Take 1 tablet (100 mg total) by mouth daily. .  pravastatin (PRAVACHOL) 40 MG tablet, Take 1 tablet (40 mg total) by mouth daily.   Current Outpatient Medications (Respiratory):  .  albuterol (PROVENTIL) (2.5 MG/3ML) 0.083% nebulizer solution, Take 2.5 mg by nebulization 2 (two) times daily as needed for wheezing or shortness of breath. .  budesonide-formoterol (SYMBICORT) 80-4.5 MCG/ACT inhaler, Inhale 2 puffs into the lungs 2 (two) times daily. Use for asthma flares 2 puffs twice a day for 2 weeks or until cough and wheeze free .  fluticasone (FLONASE) 50 MCG/ACT nasal spray,  .  ipratropium (ATROVENT) 0.06 % nasal spray, Place 2 sprays into both nostrils 3 (three) times daily. Marland Kitchen  loratadine  (CLARITIN) 10 MG tablet, Take 10 mg by mouth daily. .  montelukast (SINGULAIR) 10 MG tablet, Take 1 tablet (10 mg total) by mouth at bedtime.  Current Facility-Administered Medications (Respiratory):  .  Benralizumab SOSY 30 mg  Current Outpatient Medications (Analgesics):  .  acetaminophen (TYLENOL) 500 MG tablet, Take 500 mg by mouth daily as needed for mild pain, moderate pain, fever or headache. For pain .  traMADol (ULTRAM) 50 MG tablet, Take 1 tablet (50 mg total) by mouth every 6 (six) hours as needed.   Current Outpatient Medications (Hematological):  Marland Kitchen  FERREX 150 150 MG capsule, Take 1 capsule by mouth once daily .  vitamin B-12 (CYANOCOBALAMIN) 1000 MCG  tablet, Take 1,000 mcg by mouth daily.   Current Outpatient Medications (Other):  Marland Kitchen  AMBULATORY NON FORMULARY MEDICATION, Allergy inj Subcutaneous every 8 weeks .  cholecalciferol (VITAMIN D) 1000 units tablet, Take 2,000 Units by mouth daily. Marland Kitchen  linaclotide (LINZESS) 72 MCG capsule, Take 1 capsule (72 mcg total) by mouth daily as needed. .  Magnesium Hydroxide (PHILLIPS MILK OF MAGNESIA PO), Take 1 Dose by mouth as needed. Marland Kitchen  omeprazole (PRILOSEC) 40 MG capsule, TAKE 1 CAPSULE DAILY FOR ACID REFLUX .  polyethylene glycol powder (GLYCOLAX/MIRALAX) powder, Take 17 g by mouth daily.    Reviewed prior external information including notes and imaging from  primary care provider As well as notes that were available from care everywhere and other healthcare systems.  Past medical history, social, surgical and family history all reviewed in electronic medical record.  No pertanent information unless stated regarding to the chief complaint.   Review of Systems:  No headache, visual changes, nausea, vomiting, diarrhea, constipation, dizziness, abdominal pain, skin rash, fevers, chills, night sweats, weight loss, swollen lymph nodes, body aches, joint swelling, chest pain, shortness of breath, mood changes. POSITIVE muscle aches   Objective  Blood pressure (!) 150/82, pulse 74, height 5\' 5"  (1.651 m), weight 177 lb (80.3 kg), SpO2 98 %.   General: No apparent distress alert and oriented x3 mood and affect normal, dressed appropriately.  HEENT: Pupils equal, extraocular movements intact  Respiratory: Patient's speak in full sentences and does not appear short of breath  Cardiovascular: No lower extremity edema, non tender, no erythema  Neuro: Cranial nerves II through XII are intact, neurovascularly intact in all extremities with 2+ DTRs and 2+ pulses.  Gait antalgic MSK: Arthritic changes of multiple joints Left shoulder positive impingement.  Mild decrease in range of motion.  4+ out of 5 strength of the rotator cuff though noted.  Near full range of motion passively  Hand exam shows a trigger nodule noted of the A2 pulley of the right thumb as well as the left fourth finger.  Arthritic changes of the hands otherwise.  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  Calcific changes noted as well.  No significant retraction are noted.  Procedure: Real-time Ultrasound Guided Injection of right flexor tendon sheath thumb Device: GE Logiq Q7 Ultrasound guided injection is preferred based studies that show increased duration, increased effect, greater accuracy, decreased procedural pain, increased response rate, and decreased cost with ultrasound guided versus blind injection.  Verbal informed consent obtained.  Time-out conducted.  Noted no overlying erythema, induration, or other signs of local infection.  Skin prepped in a sterile fashion.  Local anesthesia: Topical Ethyl chloride.  With sterile technique and under real time ultrasound guidance: With a 25-gauge half inch needle injected with 0.5 cc of  0.5% Marcaine and 0.5 cc of Kenalog 40 mg/mL Completed without difficulty  Pain immediately resolved suggesting accurate placement of the medication.  Advised to call if fevers/chills, erythema, induration, drainage, or persistent bleeding.  Images permanently stored and available for review in the ultrasound unit.  Impression: Technically successful ultrasound guided injection.  Procedure: Real-time Ultrasound Guided Injection of left fourth flexor tendon sheath Device: GE Logiq Q7 Ultrasound guided injection is preferred based studies that show increased duration, increased effect, greater accuracy, decreased procedural pain, increased response  rate, and decreased cost with ultrasound guided versus blind injection.  Verbal informed consent obtained.  Time-out conducted.  Noted no overlying erythema, induration, or other signs of local infection.  Skin prepped in a sterile fashion.  Local anesthesia: Topical Ethyl chloride.  With sterile technique and under real time ultrasound guidance: With a 25-gauge half inch needle injected with 0.5 cc of 0.5% Marcaine and 0.5 cc of Kenalog 40 mg/mL Completed without difficulty  Pain immediately resolved suggesting accurate placement of the medication.  Advised to call if fevers/chills, erythema, induration, drainage, or persistent bleeding.  Images permanently stored and available for review in the ultrasound unit.  Impression: Technically successful ultrasound guided injection.   Impression and Recommendations:     The above documentation has been reviewed and is accurate and complete Lyndal Pulley, DO       Note: This dictation was prepared with Dragon dictation along with smaller phrase technology. Any transcriptional errors that result from this process are unintentional.

## 2019-11-12 NOTE — Assessment & Plan Note (Signed)
Injection given today, tolerated procedure well, discussed icing regimen and home exercise, which activities to do which wants to avoid.  Increase activity as tolerated.

## 2019-11-12 NOTE — Assessment & Plan Note (Signed)
Patient now on ultrasound does have what appears to be a partial rotator cuff tear noted.  Hypoechoic changes noted.  We discussed the potential of repeating injection but patient would like to hold and start with formal physical therapy which I think will be beneficial.  Discussed icing regimen, discussed medications that I think would be beneficial including topical anti-inflammatories.  Follow-up with me again in 4 to 8 weeks worsening pain will consider injection and then advanced imaging but patient would likely avoid any surgical intervention

## 2019-11-16 ENCOUNTER — Telehealth: Payer: Self-pay | Admitting: Family Medicine

## 2019-11-16 NOTE — Telephone Encounter (Signed)
Patient's daughter called asking if the Physical Therapy Referral could be sent to [facility] on [address]. They are able to get her in sooner than [facility] is.

## 2019-11-16 NOTE — Telephone Encounter (Signed)
Patient notified via My Chart

## 2019-11-16 NOTE — Addendum Note (Signed)
Addended by: Carmie Kanner on: 11/16/2019 02:09 PM   Modules accepted: Orders

## 2019-11-17 ENCOUNTER — Other Ambulatory Visit: Payer: Self-pay

## 2019-11-17 ENCOUNTER — Ambulatory Visit (INDEPENDENT_AMBULATORY_CARE_PROVIDER_SITE_OTHER): Payer: Medicare HMO

## 2019-11-17 DIAGNOSIS — J4550 Severe persistent asthma, uncomplicated: Secondary | ICD-10-CM

## 2019-11-20 ENCOUNTER — Other Ambulatory Visit: Payer: Self-pay

## 2019-11-20 ENCOUNTER — Ambulatory Visit (INDEPENDENT_AMBULATORY_CARE_PROVIDER_SITE_OTHER): Payer: Medicare HMO | Admitting: Internal Medicine

## 2019-11-20 ENCOUNTER — Ambulatory Visit (INDEPENDENT_AMBULATORY_CARE_PROVIDER_SITE_OTHER): Payer: Medicare HMO

## 2019-11-20 ENCOUNTER — Encounter: Payer: Self-pay | Admitting: Internal Medicine

## 2019-11-20 VITALS — BP 140/70 | HR 75 | Temp 97.7°F | Ht 65.0 in | Wt 175.0 lb

## 2019-11-20 DIAGNOSIS — I10 Essential (primary) hypertension: Secondary | ICD-10-CM

## 2019-11-20 DIAGNOSIS — E785 Hyperlipidemia, unspecified: Secondary | ICD-10-CM | POA: Diagnosis not present

## 2019-11-20 DIAGNOSIS — Z0000 Encounter for general adult medical examination without abnormal findings: Secondary | ICD-10-CM

## 2019-11-20 DIAGNOSIS — R739 Hyperglycemia, unspecified: Secondary | ICD-10-CM

## 2019-11-20 LAB — BASIC METABOLIC PANEL
BUN: 15 mg/dL (ref 6–23)
CO2: 27 mEq/L (ref 19–32)
Calcium: 10 mg/dL (ref 8.4–10.5)
Chloride: 103 mEq/L (ref 96–112)
Creatinine, Ser: 0.85 mg/dL (ref 0.40–1.20)
GFR: 77.88 mL/min (ref >60.00)
Glucose, Bld: 95 mg/dL (ref 70–99)
Potassium: 4.6 mEq/L (ref 3.5–5.1)
Sodium: 140 mEq/L (ref 135–145)

## 2019-11-20 LAB — LIPID PANEL
Cholesterol: 177 mg/dL (ref 0–200)
HDL: 69.9 mg/dL (ref >39.00)
LDL Cholesterol: 93 mg/dL (ref 0–99)
NonHDL: 107.46
Total CHOL/HDL Ratio: 3
Triglycerides: 72 mg/dL (ref 0.0–149.0)
VLDL: 14.4 mg/dL (ref 0.0–40.0)

## 2019-11-20 LAB — HEMOGLOBIN A1C: Hgb A1c MFr Bld: 5.8 % (ref 4.6–6.5)

## 2019-11-20 LAB — HEPATIC FUNCTION PANEL
ALT: 9 U/L (ref 0–35)
AST: 20 U/L (ref 0–37)
Albumin: 4.5 g/dL (ref 3.5–5.2)
Alkaline Phosphatase: 89 U/L (ref 39–117)
Bilirubin, Direct: 0.1 mg/dL (ref 0.0–0.3)
Total Bilirubin: 0.5 mg/dL (ref 0.2–1.2)
Total Protein: 8.1 g/dL (ref 6.0–8.3)

## 2019-11-20 NOTE — Assessment & Plan Note (Signed)
stable overall by history and exam, recent data reviewed with pt, and pt to continue medical treatment as before,  to f/u any worsening symptoms or concerns  

## 2019-11-20 NOTE — Assessment & Plan Note (Addendum)
Tolerating new statin well, stable overall by history and exam, recent data reviewed with pt, and pt to continue medical treatment as before,  to f/u any worsening symptoms or concerns  I spent 31 minutes in preparing to see the patient by review of recent labs, imaging and procedures, obtaining and reviewing separately obtained history, communicating with the patient and family or caregiver, ordering medications, tests or procedures, and documenting clinical information in the EHR including the differential Dx, treatment, and any further evaluation and other management of htn, hld, hyperglycemia

## 2019-11-20 NOTE — Progress Notes (Signed)
Subjective:    Patient ID: Angelica [name], female    DOB: 06-04-1940, 80 y.o.   MRN: [id number]  HPI Here to f/u; overall doing ok,  Pt denies chest pain, increasing sob or doe, wheezing, orthopnea, PND, increased LE swelling, palpitations, dizziness or syncope.  Pt denies new neurological symptoms such as new headache, or facial or extremity weakness or numbness.  Pt denies polydipsia, polyuria, or low sugar episode.  Pt states overall good compliance with meds, mostly trying to follow appropriate diet, with wt overall stable,  but little exercise however. Left shoulder pain improving with tx per Dr Tamala Julian, and plan is for furthe rPT.  Also had cortisone to several hand joints and improved.  Tolerating new statin well Past Medical History:  Diagnosis Date  . Arthritis   . Asthma   . Bowel obstruction ([place])   . Environmental allergies   . GERD (gastroesophageal reflux disease)   . Glaucoma 05/09/2017  . H. pylori infection   . HLD (hyperlipidemia) 01/16/2019  . Hypertension   . Iron deficiency anemia   . Osteopenia 05/26/2017  . Thyroid disease   . Urticaria 07/25/2018   Past Surgical History:  Procedure Laterality Date  . ABDOMINAL HYSTERECTOMY    . BREAST BIOPSY    . COLON SURGERY    . KNEE SURGERY    . LAPAROTOMY N/A 04/18/2015   Procedure: Exploratory laparotomy with lysis of adhesions, possible bowel resection;  Surgeon: Ralene Ok, MD;  Location: [place];  Service: General;  Laterality: N/A;  . NASAL SINUS SURGERY    . THYROID SURGERY      reports that she has quit smoking. Her smoking use included cigarettes. She has never used smokeless tobacco. She reports that she does not drink alcohol and does not use drugs. family history includes Diabetes in her mother; Glaucoma in her brother and sister; Hypertension in her father and mother. Allergies  Allergen Reactions  . Asa Buff (Mag [Buffered Aspirin] Other (See Comments)    Excessive sweating  . Fish Allergy Other (See  Comments)    Unknown reaction  . Other Other (See Comments)    Gelcaps; gel-containing capsules; extended release tablets  . Peanut-Containing Drug Products Other (See Comments)    From allergy test  . Penicillins Itching    Has patient had a PCN reaction causing immediate rash, facial/tongue/throat swelling, SOB or lightheadedness with hypotension: No Has patient had a PCN reaction causing severe rash involving mucus membranes or skin necrosis: No Has patient had a PCN reaction that required hospitalization No Has patient had a PCN reaction occurring within the last 10 years: No If all of the above answers are "NO", then may proceed with Cephalosporin use.  Marland Kitchen Shellfish-Derived Products Other (See Comments)    From allergy test  . Strawberry Extract Other (See Comments)    Worsened allergy symptoms   Current Outpatient Medications on File Prior to Visit  Medication Sig Dispense Refill  . acetaminophen (TYLENOL) 500 MG tablet Take 500 mg by mouth daily as needed for mild pain, moderate pain, fever or headache. For pain    . albuterol (PROVENTIL) (2.5 MG/3ML) 0.083% nebulizer solution Take 2.5 mg by nebulization 2 (two) times daily as needed for wheezing or shortness of breath.    . AMBULATORY NON FORMULARY MEDICATION Allergy inj Subcutaneous every 8 weeks    . amLODipine (NORVASC) 10 MG tablet Take 1 tablet (10 mg total) by mouth daily. 90 tablet 3  . budesonide-formoterol (SYMBICORT) 80-4.5  MCG/ACT inhaler Inhale 2 puffs into the lungs 2 (two) times daily. Use for asthma flares 2 puffs twice a day for 2 weeks or until cough and wheeze free 1 Inhaler 5  . cholecalciferol (VITAMIN D) 1000 units tablet Take 2,000 Units by mouth daily.    Marland Kitchen EPINEPHrine (EPIPEN 2-PAK) 0.3 mg/0.3 mL IJ SOAJ injection Use as directed for severe allergic reaction 2 Device 1  . FERREX 150 150 MG capsule Take 1 capsule by mouth once daily 90 capsule 1  . fluticasone (FLONASE) 50 MCG/ACT nasal spray     .  ipratropium (ATROVENT) 0.06 % nasal spray Place 2 sprays into both nostrils 3 (three) times daily. 15 mL 3  . linaclotide (LINZESS) 72 MCG capsule Take 1 capsule (72 mcg total) by mouth daily as needed. 30 capsule 5  . loratadine (CLARITIN) 10 MG tablet Take 10 mg by mouth daily.    Marland Kitchen losartan (COZAAR) 100 MG tablet Take 1 tablet (100 mg total) by mouth daily. 90 tablet 3  . Magnesium Hydroxide (PHILLIPS MILK OF MAGNESIA PO) Take 1 Dose by mouth as needed.    . montelukast (SINGULAIR) 10 MG tablet Take 1 tablet (10 mg total) by mouth at bedtime. 90 tablet 3  . omeprazole (PRILOSEC) 40 MG capsule TAKE 1 CAPSULE DAILY FOR ACID REFLUX 90 capsule 3  . polyethylene glycol powder (GLYCOLAX/MIRALAX) powder Take 17 g by mouth daily.    . pravastatin (PRAVACHOL) 40 MG tablet Take 1 tablet (40 mg total) by mouth daily. 90 tablet 3  . traMADol (ULTRAM) 50 MG tablet Take 1 tablet (50 mg total) by mouth every 6 (six) hours as needed. 120 tablet 2  . vitamin B-12 (CYANOCOBALAMIN) 1000 MCG tablet Take 1,000 mcg by mouth daily.     Current Facility-Administered Medications on File Prior to Visit  Medication Dose Route Frequency Provider Last Rate Last Admin  . Benralizumab SOSY 30 mg  30 mg Subcutaneous Q8 Weeks Kozlow, Donnamarie Poag, MD   30 mg at 11/17/19 0930   Review of Systems All otherwise neg per pt     Objective:   Physical Exam BP 140/70 (BP Location: Left Arm, Patient Position: Sitting, Cuff Size: Large)   Pulse 75   Temp 97.7 F (36.5 C) (Oral)   Ht 5\' 5"  (1.651 m)   Wt 175 lb (79.4 kg)   SpO2 99%   BMI 29.12 kg/m   VS noted,  Constitutional: Pt appears in NAD HENT: Head: NCAT.  Right Ear: External ear normal.  Left Ear: External ear normal.  Eyes: . Pupils are equal, round, and reactive to light. Conjunctivae and EOM are normal Nose: without d/c or deformity Neck: Neck supple. Gross normal ROM Cardiovascular: Normal rate and regular rhythm.   Pulmonary/Chest: Effort normal and breath  sounds without rales or wheezing.  Abd:  Soft, NT, ND, + BS, no organomegaly Neurological: Pt is alert. At baseline orientation, motor grossly intact Skin: Skin is warm. No rashes, other new lesions, no LE edema Psychiatric: Pt behavior is normal without agitation  All otherwise neg per pt VS noted,  Constitutional: Pt appears in NAD HENT: Head: NCAT.  Right Ear: External ear normal.  Left Ear: External ear normal.  Eyes: . Pupils are equal, round, and reactive to light. Conjunctivae and EOM are normal Nose: without d/c or deformity Neck: Neck supple. Gross normal ROM Cardiovascular: Normal rate and regular rhythm.   Pulmonary/Chest: Effort normal and breath sounds without rales or wheezing.  Abd:  Soft, NT, ND, + BS, no organomegaly Neurological: Pt is alert. At baseline orientation, motor grossly intact Skin: Skin is warm. No rashes, other new lesions, no LE edema Psychiatric: Pt behavior is normal without agitation  All otherwise neg per pt Lab Results  Component Value Date   WBC 7.3 08/12/2019   HGB 14.8 08/12/2019   HCT 44.3 08/12/2019   PLT 292.0 08/12/2019   GLUCOSE 97 08/12/2019   CHOL 223 (H) 08/12/2019   TRIG 78.0 08/12/2019   HDL 69.90 08/12/2019   LDLCALC 138 (H) 08/12/2019   ALT 10 08/12/2019   AST 21 08/12/2019   NA 139 08/12/2019   K 3.6 08/12/2019   CL 103 08/12/2019   CREATININE 0.77 08/12/2019   BUN 11 08/12/2019   CO2 23 08/12/2019   TSH 2.64 08/12/2019   HGBA1C 5.7 08/12/2019         Assessment & Plan:

## 2019-11-20 NOTE — Patient Instructions (Signed)
Please continue all other medications as before, and refills have been done if requested.  Please have the pharmacy call with any other refills you may need.  Please continue your efforts at being more active, low cholesterol diet, and weight control.  You are otherwise up to date with prevention measures today.  Please keep your appointments with your specialists as you may have planned  Please go to the LAB at the blood drawing area for the tests to be done  You will be contacted by phone if any changes need to be made immediately.  Otherwise, you will receive a letter about your results with an explanation, but please check with MyChart first.  In order to help keep our patients safe and at home we are billing the insurance company for a health consult. You could potentially get a copay to a maximum of $15. Do you agree to this in order to obtain our advice about your concerns?  Please make an Appointment to return in 6 months, or sooner if needed

## 2019-11-20 NOTE — Progress Notes (Signed)
Subjective:   Angelica [name] is a 79 y.o. female who presents for Medicare Annual (Subsequent) preventive examination.  Review of Systems    No ROS. Medicare Wellness Visit Cardiac Risk Factors include: advanced age (>75men, >52 women);dyslipidemia;family history of premature cardiovascular disease;hypertension     Objective:    Today's Vitals   11/20/19 0859  BP: 140/70  Pulse: 75  Temp: 97.7 F (36.5 C)  SpO2: 99%  Weight: 175 lb (79.4 kg)  Height: 5\' 5"  (1.651 m)  PainSc: 2   PainLoc: Shoulder   Body mass index is 29.12 kg/m.  Advanced Directives 11/20/2019 08/14/2017 07/06/2017 02/03/2017 06/08/2016 08/26/2015 05/05/2015  Does Patient Have a Medical Advance Directive? No No No No No No No  Would patient like information on creating a medical advance directive? No - Patient declined No - Patient declined - No - Patient declined - No - patient declined information No - patient declined information    Current Medications (verified) Outpatient Encounter Medications as of 11/20/2019  Medication Sig  . acetaminophen (TYLENOL) 500 MG tablet Take 500 mg by mouth daily as needed for mild pain, moderate pain, fever or headache. For pain  . albuterol (PROVENTIL) (2.5 MG/3ML) 0.083% nebulizer solution Take 2.5 mg by nebulization 2 (two) times daily as needed for wheezing or shortness of breath.  . AMBULATORY NON FORMULARY MEDICATION Allergy inj Subcutaneous every 8 weeks  . amLODipine (NORVASC) 10 MG tablet Take 1 tablet (10 mg total) by mouth daily.  . budesonide-formoterol (SYMBICORT) 80-4.5 MCG/ACT inhaler Inhale 2 puffs into the lungs 2 (two) times daily. Use for asthma flares 2 puffs twice a day for 2 weeks or until cough and wheeze free  . cholecalciferol (VITAMIN D) 1000 units tablet Take 2,000 Units by mouth daily.  Marland Kitchen EPINEPHrine (EPIPEN 2-PAK) 0.3 mg/0.3 mL IJ SOAJ injection Use as directed for severe allergic reaction  . FERREX 150 150 MG capsule Take 1 capsule by mouth once  daily  . fluticasone (FLONASE) 50 MCG/ACT nasal spray   . ipratropium (ATROVENT) 0.06 % nasal spray Place 2 sprays into both nostrils 3 (three) times daily.  Marland Kitchen linaclotide (LINZESS) 72 MCG capsule Take 1 capsule (72 mcg total) by mouth daily as needed.  . loratadine (CLARITIN) 10 MG tablet Take 10 mg by mouth daily.  Marland Kitchen losartan (COZAAR) 100 MG tablet Take 1 tablet (100 mg total) by mouth daily.  . Magnesium Hydroxide (PHILLIPS MILK OF MAGNESIA PO) Take 1 Dose by mouth as needed.  . montelukast (SINGULAIR) 10 MG tablet Take 1 tablet (10 mg total) by mouth at bedtime.  Marland Kitchen omeprazole (PRILOSEC) 40 MG capsule TAKE 1 CAPSULE DAILY FOR ACID REFLUX  . polyethylene glycol powder (GLYCOLAX/MIRALAX) powder Take 17 g by mouth daily.  . pravastatin (PRAVACHOL) 40 MG tablet Take 1 tablet (40 mg total) by mouth daily.  . traMADol (ULTRAM) 50 MG tablet Take 1 tablet (50 mg total) by mouth every 6 (six) hours as needed.  . vitamin B-12 (CYANOCOBALAMIN) 1000 MCG tablet Take 1,000 mcg by mouth daily.   Facility-Administered Encounter Medications as of 11/20/2019  Medication  . Benralizumab SOSY 30 mg    Allergies (verified) Asa buff (mag [buffered aspirin], Fish allergy, Other, Peanut-containing drug products, Penicillins, Shellfish-derived products, and Strawberry extract   History: Past Medical History:  Diagnosis Date  . Arthritis   . Asthma   . Bowel obstruction ([place])   . Environmental allergies   . GERD (gastroesophageal reflux disease)   . Glaucoma  05/09/2017  . H. pylori infection   . HLD (hyperlipidemia) 01/16/2019  . Hypertension   . Iron deficiency anemia   . Osteopenia 05/26/2017  . Thyroid disease   . Urticaria 07/25/2018   Past Surgical History:  Procedure Laterality Date  . ABDOMINAL HYSTERECTOMY    . BREAST BIOPSY    . COLON SURGERY    . KNEE SURGERY    . LAPAROTOMY N/A 04/18/2015   Procedure: Exploratory laparotomy with lysis of adhesions, possible bowel resection;  Surgeon:  Ralene Ok, MD;  Location: [place];  Service: General;  Laterality: N/A;  . NASAL SINUS SURGERY    . THYROID SURGERY     Family History  Problem Relation Age of Onset  . Diabetes Mother   . Hypertension Mother   . Hypertension Father   . Glaucoma Sister   . Glaucoma Brother   . Allergic rhinitis Neg Hx   . Angioedema Neg Hx   . Asthma Neg Hx   . Eczema Neg Hx   . Immunodeficiency Neg Hx   . Urticaria Neg Hx    Social History   Socioeconomic History  . Marital status: Widowed    Spouse name: Not on file  . Number of children: 4  . Years of education: Not on file  . Highest education level: Not on file  Occupational History  . Occupation: retired  Tobacco Use  . Smoking status: Former Smoker    Types: Cigarettes  . Smokeless tobacco: Never Used  Vaping Use  . Vaping Use: Never used  Substance and Sexual Activity  . Alcohol use: No  . Drug use: No  . Sexual activity: Never  Other Topics Concern  . Not on file  Social History Narrative  . Not on file   Social Determinants of Health   Financial Resource Strain: Low Risk   . Difficulty of Paying Living Expenses: Not hard at all  Food Insecurity: No Food Insecurity  . Worried About Charity fundraiser in the Last Year: Never true  . Ran Out of Food in the Last Year: Never true  Transportation Needs: No Transportation Needs  . Lack of Transportation (Medical): No  . Lack of Transportation (Non-Medical): No  Physical Activity: Sufficiently Active  . Days of Exercise per Week: 5 days  . Minutes of Exercise per Session: 30 min  Stress: No Stress Concern Present  . Feeling of Stress : Not at all  Social Connections: Moderately Integrated  . Frequency of Communication with Friends and Family: More than three times a week  . Frequency of Social Gatherings with Friends and Family: More than three times a week  . Attends Religious Services: More than 4 times per year  . Active Member of Clubs or Organizations: Yes  .  Attends Archivist Meetings: More than 4 times per year  . Marital Status: Widowed    Tobacco Counseling Counseling given: No   Clinical Intake:  Pre-visit preparation completed: Yes  Pain : 0-10 Pain Score: 2  Pain Type: Acute pain Pain Location: Shoulder Pain Orientation: Left Pain Radiating Towards: none Pain Descriptors / Indicators: Discomfort Pain Onset: More than a month ago Pain Frequency: Intermittent Pain Relieving Factors: Tylenol  Pain Relieving Factors: Tylenol  BMI - recorded: 29.12 Nutritional Status: BMI 25 -29 Overweight Nutritional Risks: None Diabetes: No  How often do you need to have someone help you when you read instructions, pamphlets, or other written materials from your doctor or pharmacy?: 1 - Never  What is the last grade level you completed in school?: 9th grade  Diabetic? no  Interpreter Needed?: No  Information entered by :: Atilano Covelli N. Ambika Zettlemoyer, LPN   Activities of Daily Living In your present state of health, do you have any difficulty performing the following activities: 11/20/2019  Hearing? N  Vision? N  Difficulty concentrating or making decisions? N  Walking or climbing stairs? N  Dressing or bathing? N  Doing errands, shopping? N  Preparing Food and eating ? N  Using the Toilet? N  In the past six months, have you accidently leaked urine? N  Do you have problems with loss of bowel control? N  Managing your Medications? N  Managing your Finances? N  Housekeeping or managing your Housekeeping? N  Some recent data might be hidden    Patient Care Team: [name], MD as PCP - General (Internal Medicine)  Indicate any recent Medical Services you may have received from other than [facility] providers in the past year (date may be approximate).     Assessment:   This is a routine wellness examination for Yareli.  Hearing/Vision screen No exam data present  Dietary issues and exercise activities  discussed: Current Exercise Habits: Home exercise routine, Type of exercise: walking (yard work), Time (Minutes): 30, Frequency (Times/Week): 5, Weekly Exercise (Minutes/Week): 150, Intensity: Moderate, Exercise limited by: None identified  Goals    .  Client understands the importance of follow-up with providers by attending scheduled visits (pt-stated)      Continue to maintain, be active physically and socially.      Depression Screen PHQ 2/9 Scores 11/20/2019 08/12/2019 11/14/2018 11/08/2017 05/09/2017 05/05/2015  PHQ - 2 Score 0 0 0 0 0 0  PHQ- 9 Score - - - 0 0 -    Fall Risk Fall Risk  11/20/2019 08/12/2019 08/12/2019 11/14/2018 11/08/2017  Falls in the past year? 0 1 1 0 No  Number falls in past yr: 0 0 1 - -  Injury with Fall? 0 0 1 - -  Risk for fall due to : No Fall Risks - - - -  Follow up Falls evaluation completed - - - -    Any stairs in or around the home? No  If so, are there any without handrails? No  Home free of loose throw rugs in walkways, pet beds, electrical cords, etc? Yes  Adequate lighting in your home to reduce risk of falls? Yes   ASSISTIVE DEVICES UTILIZED TO PREVENT FALLS:  Life alert? No  Use of a cane, walker or w/c? No  Grab bars in the bathroom? No  Shower chair or bench in shower? No  Elevated toilet seat or a handicapped toilet? No   TIMED UP AND GO:  Was the test performed? No .  Length of time to ambulate 10 feet: 0 sec.   Gait steady and fast without use of assistive device  Cognitive Function: patient is cogitatively intact.        Immunizations Immunization History  Administered Date(s) Administered  . Fluad Quad(high Dose 65+) 01/16/2019  . Influenza, High Dose Seasonal PF 01/30/2018  . PFIZER SARS-COV-2 Vaccination 07/13/2019, 08/03/2019  . Pneumococcal Conjugate-13 11/08/2017    TDAP status: Due, Education has been provided regarding the importance of this vaccine. Advised may receive this vaccine at local pharmacy or Health  Dept. Aware to provide a copy of the vaccination record if obtained from local pharmacy or Health Dept. Verbalized acceptance and understanding. Patient declined. Flu  Vaccine status: Up to date Pneumococcal vaccine status: Up to date; missing Pneumovax23 Covid-19 vaccine status: Completed vaccines  Qualifies for Shingles Vaccine? Yes   Zostavax completed No   Shingrix Completed?: No.    Education has been provided regarding the importance of this vaccine. Patient has been advised to call insurance company to determine out of pocket expense if they have not yet received this vaccine. Advised may also receive vaccine at local pharmacy or Health Dept. Verbalized acceptance and understanding.  Screening Tests Health Maintenance  Topic Date Due  . Hepatitis C Screening  Never done  . TETANUS/TDAP  Never done  . PNA vac Low Risk Adult (2 of 2 - PPSV23) 11/09/2018  . INFLUENZA VACCINE  12/20/2019  . DEXA SCAN  Completed  . COVID-19 Vaccine  Completed    Health Maintenance  Health Maintenance Due  Topic Date Due  . Hepatitis C Screening  Never done  . TETANUS/TDAP  Never done  . PNA vac Low Risk Adult (2 of 2 - PPSV23) 11/09/2018    Colorectal cancer screening: No longer required. Last colonoscopy done 02/03/2019. Mammogram status: Completed 01/06/2019. Repeat every year Bone Density status: Completed 05/16/2017. Results reflect: Bone density results: NORMAL. Repeat every 8-10 years.  Lung Cancer Screening: (Low Dose CT Chest recommended if Age 53-80 years, 30 pack-year currently smoking OR have quit w/in 15years.) does not qualify.   Lung Cancer Screening Referral: no  Additional Screening:  Hepatitis C Screening: does not qualify; Completed no  Vision Screening: Recommended annual ophthalmology exams for early detection of glaucoma and other disorders of the eye. Is the patient up to date with their annual eye exam?  Yes  Who is the provider or what is the name of the office in  which the patient attends annual eye exams? Marshall Cork, MD at [facility] If pt is not established with a provider, would they like to be referred to a provider to establish care? No .   Dental Screening: Recommended annual dental exams for proper oral hygiene  Community Resource Referral / Chronic Care Management: CRR required this visit?  No   CCM required this visit?  No      Plan:     I have personally reviewed and noted the following in the patient's chart:   . Medical and social history . Use of alcohol, tobacco or illicit drugs  . Current medications and supplements . Functional ability and status . Nutritional status . Physical activity . Advanced directives . List of other physicians . Hospitalizations, surgeries, and ER visits in previous 12 months . Vitals . Screenings to include cognitive, depression, and falls . Referrals and appointments  In addition, I have reviewed and discussed with patient certain preventive protocols, quality metrics, and best practice recommendations. A written personalized care plan for preventive services as well as general preventive health recommendations were provided to patient.     Sheral Flow, LPN   10/26/5914   Nurse Notes:  Patient is cogitatively intact. Patient stated that she has no issues with gait or balance; does not use any assistive devices.

## 2019-11-20 NOTE — Patient Instructions (Addendum)
Angelica [name] , Thank you for taking time to come for your Medicare Wellness Visit. I appreciate your ongoing commitment to your health goals. Please review the following plan we discussed and let me know if I can assist you in the future.   Screening recommendations/referrals: Colonoscopy: 02/03/2019 Mammogram: 01/06/2019 Bone Density: 05/16/2017 Recommended yearly ophthalmology/optometry visit for glaucoma screening and checkup Recommended yearly dental visit for hygiene and checkup  Vaccinations: Influenza vaccine: 01/16/2019 Pneumococcal vaccine: not completed Tdap vaccine: never done Shingles vaccine: never done   Covid-19: completed  Advanced directives: Advance directive discussed with you today. Even though you declined this today please call our office should you change your mind and we can give you the proper paperwork for you to fill out.  Conditions/risks identified: Yes. Please continue to do your personal lifestyle choices by: daily care of teeth and gums, regular physical activity (goal should be 5 days a week for 30 minutes), eat a healthy diet, avoid tobacco and drug use, limiting any alcohol intake, taking a low-dose aspirin (if not allergic or have been advised by your provider otherwise) and taking vitamins and minerals as recommended by your provider. Continue doing brain stimulating activities (puzzles, reading, adult coloring books, staying active) to keep memory sharp. Continue to eat heart healthy diet (full of fruits, vegetables, whole grains, lean protein, water--limit salt, fat, and sugar intake) and increase physical activity as tolerated.  Next appointment: Please schedule your next Medicare Wellness Visit with your Nurse Health Advisor in 1 year.   Preventive Care 21 Years and Older, Female Preventive care refers to lifestyle choices and visits with your health care provider that can promote health and wellness. What does preventive care include?  A yearly  physical exam. This is also called an annual well check.  Dental exams once or twice a year.  Routine eye exams. Ask your health care provider how often you should have your eyes checked.  Personal lifestyle choices, including:  Daily care of your teeth and gums.  Regular physical activity.  Eating a healthy diet.  Avoiding tobacco and drug use.  Limiting alcohol use.  Practicing safe sex.  Taking low-dose aspirin every day.  Taking vitamin and mineral supplements as recommended by your health care provider. What happens during an annual well check? The services and screenings done by your health care provider during your annual well check will depend on your age, overall health, lifestyle risk factors, and family history of disease. Counseling  Your health care provider may ask you questions about your:  Alcohol use.  Tobacco use.  Drug use.  Emotional well-being.  Home and relationship well-being.  Sexual activity.  Eating habits.  History of falls.  Memory and ability to understand (cognition).  Work and work Statistician.  Reproductive health. Screening  You may have the following tests or measurements:  Height, weight, and BMI.  Blood pressure.  Lipid and cholesterol levels. These may be checked every 5 years, or more frequently if you are over 6 years old.  Skin check.  Lung cancer screening. You may have this screening every year starting at age 43 if you have a 30-pack-year history of smoking and currently smoke or have quit within the past 15 years.  Fecal occult blood test (FOBT) of the stool. You may have this test every year starting at age 90.  Flexible sigmoidoscopy or colonoscopy. You may have a sigmoidoscopy every 5 years or a colonoscopy every 10 years starting at age 22.  Hepatitis C  blood test.  Hepatitis B blood test.  Sexually transmitted disease (STD) testing.  Diabetes screening. This is done by checking your blood sugar  (glucose) after you have not eaten for a while (fasting). You may have this done every 1-3 years.  Bone density scan. This is done to screen for osteoporosis. You may have this done starting at age 59.  Mammogram. This may be done every 1-2 years. Talk to your health care provider about how often you should have regular mammograms. Talk with your health care provider about your test results, treatment options, and if necessary, the need for more tests. Vaccines  Your health care provider may recommend certain vaccines, such as:  Influenza vaccine. This is recommended every year.  Tetanus, diphtheria, and acellular pertussis (Tdap, Td) vaccine. You may need a Td booster every 10 years.  Zoster vaccine. You may need this after age 28.  Pneumococcal 13-valent conjugate (PCV13) vaccine. One dose is recommended after age 77.  Pneumococcal polysaccharide (PPSV23) vaccine. One dose is recommended after age 83. Talk to your health care provider about which screenings and vaccines you need and how often you need them. This information is not intended to replace advice given to you by your health care provider. Make sure you discuss any questions you have with your health care provider. Document Released: 06/03/2015 Document Revised: 01/25/2016 Document Reviewed: 03/08/2015 Elsevier Interactive Patient Education  2017 [place] Prevention in the Home Falls can cause injuries. They can happen to people of all ages. There are many things you can do to make your home safe and to help prevent falls. What can I do on the outside of my home?  Regularly fix the edges of walkways and driveways and fix any cracks.  Remove anything that might make you trip as you walk through a door, such as a raised step or threshold.  Trim any bushes or trees on the path to your home.  Use bright outdoor lighting.  Clear any walking paths of anything that might make someone trip, such as rocks or  tools.  Regularly check to see if handrails are loose or broken. Make sure that both sides of any steps have handrails.  Any raised decks and porches should have guardrails on the edges.  Have any leaves, snow, or ice cleared regularly.  Use sand or salt on walking paths during winter.  Clean up any spills in your garage right away. This includes oil or grease spills. What can I do in the bathroom?  Use night lights.  Install grab bars by the toilet and in the tub and shower. Do not use towel bars as grab bars.  Use non-skid mats or decals in the tub or shower.  If you need to sit down in the shower, use a plastic, non-slip stool.  Keep the floor dry. Clean up any water that spills on the floor as soon as it happens.  Remove soap buildup in the tub or shower regularly.  Attach bath mats securely with double-sided non-slip rug tape.  Do not have throw rugs and other things on the floor that can make you trip. What can I do in the bedroom?  Use night lights.  Make sure that you have a light by your bed that is easy to reach.  Do not use any sheets or blankets that are too big for your bed. They should not hang down onto the floor.  Have a firm chair that has side arms. You  can use this for support while you get dressed.  Do not have throw rugs and other things on the floor that can make you trip. What can I do in the kitchen?  Clean up any spills right away.  Avoid walking on wet floors.  Keep items that you use a lot in easy-to-reach places.  If you need to reach something above you, use a strong step stool that has a grab bar.  Keep electrical cords out of the way.  Do not use floor polish or wax that makes floors slippery. If you must use wax, use non-skid floor wax.  Do not have throw rugs and other things on the floor that can make you trip. What can I do with my stairs?  Do not leave any items on the stairs.  Make sure that there are handrails on both  sides of the stairs and use them. Fix handrails that are broken or loose. Make sure that handrails are as long as the stairways.  Check any carpeting to make sure that it is firmly attached to the stairs. Fix any carpet that is loose or worn.  Avoid having throw rugs at the top or bottom of the stairs. If you do have throw rugs, attach them to the floor with carpet tape.  Make sure that you have a light switch at the top of the stairs and the bottom of the stairs. If you do not have them, ask someone to add them for you. What else can I do to help prevent falls?  Wear shoes that:  Do not have high heels.  Have rubber bottoms.  Are comfortable and fit you well.  Are closed at the toe. Do not wear sandals.  If you use a stepladder:  Make sure that it is fully opened. Do not climb a closed stepladder.  Make sure that both sides of the stepladder are locked into place.  Ask someone to hold it for you, if possible.  Clearly mark and make sure that you can see:  Any grab bars or handrails.  First and last steps.  Where the edge of each step is.  Use tools that help you move around (mobility aids) if they are needed. These include:  Canes.  Walkers.  Scooters.  Crutches.  Turn on the lights when you go into a dark area. Replace any light bulbs as soon as they burn out.  Set up your furniture so you have a clear path. Avoid moving your furniture around.  If any of your floors are uneven, fix them.  If there are any pets around you, be aware of where they are.  Review your medicines with your doctor. Some medicines can make you feel dizzy. This can increase your chance of falling. Ask your doctor what other things that you can do to help prevent falls. This information is not intended to replace advice given to you by your health care provider. Make sure you discuss any questions you have with your health care provider. Document Released: 03/03/2009 Document Revised:  10/13/2015 Document Reviewed: 06/11/2014 Elsevier Interactive Patient Education  2017 [employer].

## 2019-11-20 NOTE — Addendum Note (Signed)
Addended by: Trenda Moots on: 0/11/4598 09:20 AM   Modules accepted: Orders

## 2019-11-24 ENCOUNTER — Other Ambulatory Visit: Payer: Self-pay | Admitting: Internal Medicine

## 2019-11-24 NOTE — Telephone Encounter (Signed)
Ok to let pt know on chart review, her last iron tests were normal, so I think we can hold on further iron for now, unless she has an other good reason to continue taking it, thanks

## 2019-11-26 DIAGNOSIS — S43422D Sprain of left rotator cuff capsule, subsequent encounter: Secondary | ICD-10-CM | POA: Diagnosis not present

## 2019-11-26 DIAGNOSIS — R531 Weakness: Secondary | ICD-10-CM | POA: Diagnosis not present

## 2019-11-26 DIAGNOSIS — M25512 Pain in left shoulder: Secondary | ICD-10-CM | POA: Diagnosis not present

## 2019-11-30 DIAGNOSIS — S43422D Sprain of left rotator cuff capsule, subsequent encounter: Secondary | ICD-10-CM | POA: Diagnosis not present

## 2019-11-30 DIAGNOSIS — M25512 Pain in left shoulder: Secondary | ICD-10-CM | POA: Diagnosis not present

## 2019-11-30 DIAGNOSIS — R531 Weakness: Secondary | ICD-10-CM | POA: Diagnosis not present

## 2019-12-02 DIAGNOSIS — R531 Weakness: Secondary | ICD-10-CM | POA: Diagnosis not present

## 2019-12-02 DIAGNOSIS — M25512 Pain in left shoulder: Secondary | ICD-10-CM | POA: Diagnosis not present

## 2019-12-02 DIAGNOSIS — S43422D Sprain of left rotator cuff capsule, subsequent encounter: Secondary | ICD-10-CM | POA: Diagnosis not present

## 2019-12-07 ENCOUNTER — Other Ambulatory Visit: Payer: Self-pay | Admitting: Internal Medicine

## 2019-12-07 DIAGNOSIS — Z1231 Encounter for screening mammogram for malignant neoplasm of breast: Secondary | ICD-10-CM

## 2019-12-07 DIAGNOSIS — R531 Weakness: Secondary | ICD-10-CM | POA: Diagnosis not present

## 2019-12-07 DIAGNOSIS — S43422D Sprain of left rotator cuff capsule, subsequent encounter: Secondary | ICD-10-CM | POA: Diagnosis not present

## 2019-12-07 DIAGNOSIS — M25512 Pain in left shoulder: Secondary | ICD-10-CM | POA: Diagnosis not present

## 2019-12-09 DIAGNOSIS — M25512 Pain in left shoulder: Secondary | ICD-10-CM | POA: Diagnosis not present

## 2019-12-09 DIAGNOSIS — S43422D Sprain of left rotator cuff capsule, subsequent encounter: Secondary | ICD-10-CM | POA: Diagnosis not present

## 2019-12-09 DIAGNOSIS — R531 Weakness: Secondary | ICD-10-CM | POA: Diagnosis not present

## 2019-12-14 DIAGNOSIS — S43422D Sprain of left rotator cuff capsule, subsequent encounter: Secondary | ICD-10-CM | POA: Diagnosis not present

## 2019-12-14 DIAGNOSIS — R531 Weakness: Secondary | ICD-10-CM | POA: Diagnosis not present

## 2019-12-14 DIAGNOSIS — M25512 Pain in left shoulder: Secondary | ICD-10-CM | POA: Diagnosis not present

## 2019-12-16 DIAGNOSIS — M25512 Pain in left shoulder: Secondary | ICD-10-CM | POA: Diagnosis not present

## 2019-12-16 DIAGNOSIS — S43422D Sprain of left rotator cuff capsule, subsequent encounter: Secondary | ICD-10-CM | POA: Diagnosis not present

## 2019-12-16 DIAGNOSIS — R531 Weakness: Secondary | ICD-10-CM | POA: Diagnosis not present

## 2019-12-18 ENCOUNTER — Ambulatory Visit
Admission: EM | Admit: 2019-12-18 | Discharge: 2019-12-18 | Disposition: A | Discharge status: Home / Self Care | Payer: Medicare HMO | Attending: Emergency Medicine | Admitting: Emergency Medicine

## 2019-12-18 ENCOUNTER — Other Ambulatory Visit: Payer: Self-pay

## 2019-12-18 DIAGNOSIS — K047 Periapical abscess without sinus: Secondary | ICD-10-CM

## 2019-12-18 MED ORDER — AMOXICILLIN-POT CLAVULANATE 875-125 MG PO TABS
1.0000 | ORAL_TABLET | Freq: Two times a day (BID) | ORAL | 0 refills | Status: DC
Start: 2019-12-18 — End: 2020-01-19

## 2019-12-18 MED ORDER — NAPROXEN 500 MG PO TABS
500.0000 mg | ORAL_TABLET | Freq: Two times a day (BID) | ORAL | 0 refills | Status: DC
Start: 2019-12-18 — End: 2020-05-11

## 2019-12-18 NOTE — Discharge Instructions (Addendum)
Low-Cost Museum/gallery exhibitions officer Resources:  [facility] Address: [address] [place], [place], [address] Phone: [phone]  - Dr. Donn Pierini Address: [address] [place], [place], [address] Phone: [phone]

## 2019-12-18 NOTE — ED Triage Notes (Signed)
Pt c/o rt lower toothache with swelling to rt lower face area since Monday.

## 2019-12-18 NOTE — ED Provider Notes (Signed)
[facility]    CSN: [id number] Arrival date & time: 12/18/19  1936      History   Chief Complaint Chief Complaint  Patient presents with  . Dental Pain    HPI Angelica [name] is a 79 y.o. female with history of hypertension, thyroid disease, GERD, asthma presenting for right lower tooth ache.  States has been since Monday, has developed lower jaw swelling since.  Does not have a dentist currently.  No fever, difficulty breathing or swallowing, chest pain, palpitations.   Past Medical History:  Diagnosis Date  . Arthritis   . Asthma   . Bowel obstruction ([place])   . Environmental allergies   . GERD (gastroesophageal reflux disease)   . Glaucoma 05/09/2017  . H. pylori infection   . HLD (hyperlipidemia) 01/16/2019  . Hypertension   . Iron deficiency anemia   . Osteopenia 05/26/2017  . Thyroid disease   . Urticaria 07/25/2018    Patient Active Problem List   Diagnosis Date Noted  . Left rotator cuff tear 11/12/2019  . Trigger thumb of right hand 11/12/2019  . Trigger finger, left ring finger 11/12/2019  . Left shoulder pain 09/29/2019  . Swelling of lower extremity 07/30/2019  . Greater trochanteric bursitis of left hip 04/07/2019  . Right ankle swelling 02/18/2019  . Ganglion cyst of right foot 01/16/2019  . HLD (hyperlipidemia) 01/16/2019  . 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 Urticaria 07/25/2018  . Low back pain 07/09/2018  . Hyperglycemia 07/09/2018  . Rash 10/10/2017  . Constipation 08/19/2017  . Hypokalemia 07/09/2017  . Asthma, severe persistent, well-controlled 06/04/2017  . 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 Nasal polyposis 06/04/2017  . Leukocytosis 06/04/2017  . Osteopenia 05/26/2017  . Encounter for well adult exam with abnormal findings 05/09/2017  . Glaucoma 05/09/2017  . GERD (gastroesophageal reflux disease)   . H. pylori infection   . Other allergic rhinitis 03/20/2016  . Cough, persistent 03/20/2016  .  Hypoglycemia 04/17/2015  . Malnutrition of moderate degree 04/11/2015  . SBO (small bowel obstruction) ([place]) 04/10/2015  . HTN (hypertension) 04/10/2015    Past Surgical History:  Procedure Laterality Date  . ABDOMINAL HYSTERECTOMY    . BREAST BIOPSY    . COLON SURGERY    . KNEE SURGERY    . LAPAROTOMY N/A 04/18/2015   Procedure: Exploratory laparotomy with lysis of adhesions, possible bowel resection;  Surgeon: Ralene Ok, MD;  Location: [facility];  Service: General;  Laterality: N/A;  . NASAL SINUS SURGERY    . THYROID SURGERY      OB History   No obstetric history on file.      Home Medications    Prior to Admission medications   Medication Sig Start Date End Date Taking? Authorizing Provider  acetaminophen (TYLENOL) 500 MG tablet Take 500 mg by mouth daily as needed for mild pain, moderate pain, fever or headache. For pain    [provider]  albuterol (PROVENTIL) (2.5 MG/3ML) 0.083% nebulizer solution Take 2.5 mg by nebulization 2 (two) times daily as needed for wheezing or shortness of breath.    [provider]  AMBULATORY NON FORMULARY MEDICATION Allergy inj Subcutaneous every 8 weeks    [provider]  amLODipine (NORVASC) 10 MG tablet Take 1 tablet (10 mg total) by mouth daily. 05/13/19   [name], MD  amoxicillin-clavulanate (AUGMENTIN) 875-125 MG tablet Take 1 tablet by mouth every 12 (twelve) hours. 12/18/19   Hall-Potvin, [place], PA-C  budesonide-formoterol (SYMBICORT)  80-4.5 MCG/ACT inhaler Inhale 2 puffs into the lungs 2 (two) times daily. Use for asthma flares 2 puffs twice a day for 2 weeks or until cough and wheeze free 07/25/18   Ambs, Kathrine Cords, FNP  cholecalciferol (VITAMIN D) 1000 units tablet Take 2,000 Units by mouth daily.    [provider]  EPINEPHrine (EPIPEN 2-PAK) 0.3 mg/0.3 mL IJ SOAJ injection Use as directed for severe allergic reaction 06/17/17   Bobbitt, [place] Muta, MD  FERREX 150 150 MG capsule Take 1  capsule by mouth once daily 11/25/19   [name], MD  fluticasone [facility]) 50 MCG/ACT nasal spray  07/05/19   [provider]  ipratropium (ATROVENT) 0.06 % nasal spray Place 2 sprays into both nostrils 3 (three) times daily. 11/12/19   Kennith Gain, MD  linaclotide [facility]) 72 MCG capsule Take 1 capsule (72 mcg total) by mouth daily as needed. 11/14/18   [name], MD  loratadine (CLARITIN) 10 MG tablet Take 10 mg by mouth daily.    [provider]  losartan (COZAAR) 100 MG tablet Take 1 tablet (100 mg total) by mouth daily. 08/12/19   [name], MD  Magnesium Hydroxide (PHILLIPS MILK OF MAGNESIA PO) Take 1 Dose by mouth as needed.    [provider]  montelukast (SINGULAIR) 10 MG tablet Take 1 tablet (10 mg total) by mouth at bedtime. 07/09/18   [name], MD  naproxen (NAPROSYN) 500 MG tablet Take 1 tablet (500 mg total) by mouth 2 (two) times daily. 12/18/19   Hall-Potvin, [place], PA-C  omeprazole (PRILOSEC) 40 MG capsule TAKE 1 CAPSULE DAILY FOR ACID REFLUX 11/05/18   [name], MD  polyethylene glycol powder (GLYCOLAX/MIRALAX) powder Take 17 g by mouth daily.    [provider]  pravastatin (PRAVACHOL) 40 MG tablet Take 1 tablet (40 mg total) by mouth daily. 08/12/19   [name], MD  traMADol (ULTRAM) 50 MG tablet Take 1 tablet (50 mg total) by mouth every 6 (six) hours as needed. 08/12/19   [name], MD  vitamin B-12 (CYANOCOBALAMIN) 1000 MCG tablet Take 1,000 mcg by mouth daily.    [provider]    Family History Family History  Problem Relation Age of Onset  . Diabetes Mother   . Hypertension Mother   . Hypertension Father   . Glaucoma Sister   . Glaucoma Brother   . Allergic rhinitis Neg Hx   . Angioedema Neg Hx   . Asthma Neg Hx   . Eczema Neg Hx   . Immunodeficiency Neg Hx   . Urticaria Neg Hx     Social History Social History   Tobacco Use  . Smoking status: Former Smoker    Types:  Cigarettes  . Smokeless tobacco: Never Used  Vaping Use  . Vaping Use: Never used  Substance Use Topics  . Alcohol use: No  . Drug use: No     Allergies   Asa buff (mag [buffered aspirin], Fish allergy, Other, Peanut-containing drug products, Penicillins, Shellfish-derived products, and Strawberry extract   Review of Systems As per HPI   Physical Exam Triage Vital Signs ED Triage Vitals  Enc Vitals Group     BP      Pulse      Resp      Temp      Temp src      SpO2      Weight      Height  Head Circumference      Peak Flow      Pain Score      Pain Loc      Pain Edu?      Excl. in [place]?    No data found.  Updated Vital Signs BP (!) 140/81 (BP Location: Left Arm)   Pulse 79   Temp 98.5 F (36.9 C) (Oral)   Resp 20   SpO2 97%   Visual Acuity Right Eye Distance:   Left Eye Distance:   Bilateral Distance:    Right Eye Near:   Left Eye Near:    Bilateral Near:     Physical Exam Constitutional:      General: She is not in acute distress. HENT:     Head: Normocephalic and atraumatic.     Right Ear: Tympanic membrane, ear canal and external ear normal.     Left Ear: Tympanic membrane, ear canal and external ear normal.     Mouth/Throat:     Mouth: Mucous membranes are moist.     Pharynx: Oropharynx is clear.     Comments: Poor dentition with several teeth missing.  Right lower canine with gingival swelling.  No fluctuance. Eyes:     General: No scleral icterus.    Pupils: Pupils are equal, round, and reactive to light.  Cardiovascular:     Rate and Rhythm: Normal rate.  Pulmonary:     Effort: Pulmonary effort is normal.  Musculoskeletal:     Cervical back: Normal range of motion. No tenderness.  Lymphadenopathy:     Cervical: No cervical adenopathy.  Skin:    Coloration: Skin is not jaundiced or pale.  Neurological:     Mental Status: She is alert and oriented to person, place, and time.      UC Treatments / Results  Labs (all labs  ordered are listed, but only abnormal results are displayed) Labs Reviewed - No data to display  EKG   Radiology No results found.  Procedures Procedures (including critical care time)  Medications Ordered in UC Medications - No data to display  Initial Impression / Assessment and Plan / UC Course  I have reviewed the triage vital signs and the nursing notes.  Pertinent labs & imaging results that were available during my care of the patient were reviewed by me and considered in my medical decision making (see chart for details).     Patient afebrile, nontoxic in office today.  Provided contact information for dental offices, will start Augmentin and naproxen in the interim.  Return precautions discussed, pt verbalized understanding and is agreeable to plan. Final Clinical Impressions(s) / UC Diagnoses   Final diagnoses:  Dental infection     Discharge Instructions     Low-Cost Community Dental Resources:  [facility] Address: [address] [place], [place], [address] Phone: [phone]  - Dr. Donn Pierini Address: [address] [place], [place], [address] Phone: [phone]    ED Prescriptions    Medication Sig Dispense Auth. Provider   amoxicillin-clavulanate (AUGMENTIN) 875-125 MG tablet Take 1 tablet by mouth every 12 (twelve) hours. 14 tablet Hall-Potvin, [place], PA-C   naproxen (NAPROSYN) 500 MG tablet Take 1 tablet (500 mg total) by mouth 2 (two) times daily. 30 tablet Hall-Potvin, [place], PA-C     I have reviewed the PDMP during this encounter.   Hall-Potvin, [place], [place] 12/19/19 [phone]

## 2019-12-19 ENCOUNTER — Encounter: Payer: Self-pay | Admitting: Emergency Medicine

## 2019-12-22 ENCOUNTER — Telehealth: Payer: Self-pay

## 2019-12-22 NOTE — Telephone Encounter (Signed)
New message    The daughter took her mom went to the emergency room on Friday evening.  The emergency room MD/NP  prescribes amoxicillin-clavulanate (AUGMENTIN) 875-125 MG tablet. This medication is making her sicker  The daughter is asking for another medication for her mother  Insurance claims handler [id number] - [place], [place] - [place]

## 2019-12-23 MED ORDER — CLINDAMYCIN HCL 300 MG PO CAPS: 300.0000 mg | ORAL_CAPSULE | Freq: Three times a day (TID) | ORAL | 0 refills | Status: AC

## 2019-12-23 NOTE — Telephone Encounter (Signed)
I have given the responsible provider a chance to address this correctly.

## 2019-12-23 NOTE — Telephone Encounter (Signed)
Sent to Dr. John. 

## 2019-12-25 ENCOUNTER — Ambulatory Visit: Payer: Medicare HMO | Admitting: Family Medicine

## 2019-12-25 NOTE — Progress Notes (Deleted)
Angelica [name] Phone: [phone] Subjective:    I'm seeing this patient by the request  of:  [name], MD  CC:   [id number]   11/12/2019 Injection given today, tolerated procedure well, discussed icing regimen and home exercise, which activities to do which wants to avoid.  Increase activity as tolerated.  Patient given injection today.  Bracing at night, patient should do well with conservative therapy  Patient now on ultrasound does have what appears to be a partial rotator cuff tear noted.  Hypoechoic changes noted.  We discussed the potential of repeating injection but patient would like to hold and start with formal physical therapy which I think will be beneficial.  Discussed icing regimen, discussed medications that I think would be beneficial including topical anti-inflammatories.  Follow-up with me again in 4 to 8 weeks worsening pain will consider injection and then advanced imaging but patient would likely avoid any surgical intervention  Update 12/25/2019 Angelica [name] is a 79 y.o. female coming in with complaint of left rotator cuff tear and left trigger finger. Patient states       Past Medical History:  Diagnosis Date  . Arthritis   . Asthma   . Bowel obstruction ([place])   . Environmental allergies   . GERD (gastroesophageal reflux disease)   . Glaucoma 05/09/2017  . H. pylori infection   . HLD (hyperlipidemia) 01/16/2019  . Hypertension   . Iron deficiency anemia   . Osteopenia 05/26/2017  . Thyroid disease   . Urticaria 07/25/2018   Past Surgical History:  Procedure Laterality Date  . ABDOMINAL HYSTERECTOMY    . BREAST BIOPSY    . COLON SURGERY    . KNEE SURGERY    . LAPAROTOMY N/A 04/18/2015   Procedure: Exploratory laparotomy with lysis of adhesions, possible bowel resection;  Surgeon: Ralene Ok, MD;  Location: [facility];  Service: General;  Laterality: N/A;  . NASAL SINUS  SURGERY    . THYROID SURGERY     Social History   Socioeconomic History  . Marital status: Widowed    Spouse name: Not on file  . Number of children: 4  . Years of education: Not on file  . Highest education level: Not on file  Occupational History  . Occupation: retired  Tobacco Use  . Smoking status: Former Smoker    Types: Cigarettes  . Smokeless tobacco: Never Used  Vaping Use  . Vaping Use: Never used  Substance and Sexual Activity  . Alcohol use: No  . Drug use: No  . Sexual activity: Never  Other Topics Concern  . Not on file  Social History Narrative  . Not on file   Social Determinants of Health   Financial Resource Strain: Low Risk   . Difficulty of Paying Living Expenses: Not hard at all  Food Insecurity: No Food Insecurity  . Worried About Charity fundraiser in the Last Year: Never true  . Ran Out of Food in the Last Year: Never true  Transportation Needs: No Transportation Needs  . Lack of Transportation (Medical): No  . Lack of Transportation (Non-Medical): No  Physical Activity: Sufficiently Active  . Days of Exercise per Week: 5 days  . Minutes of Exercise per Session: 30 min  Stress: No Stress Concern Present  . Feeling of Stress : Not at all  Social Connections: Moderately Integrated  . Frequency of Communication with Friends and Family: More than  three times a week  . Frequency of Social Gatherings with Friends and Family: More than three times a week  . Attends Religious Services: More than 4 times per year  . Active Member of Clubs or Organizations: Yes  . Attends Archivist Meetings: More than 4 times per year  . Marital Status: Widowed   Allergies  Allergen Reactions  . Asa Buff (Mag [Buffered Aspirin] Other (See Comments)    Excessive sweating  . Fish Allergy Other (See Comments)    Unknown reaction  . Other Other (See Comments)    Gelcaps; gel-containing capsules; extended release tablets  . Peanut-Containing Drug Products  Other (See Comments)    From allergy test  . Penicillins Itching    Has patient had a PCN reaction causing immediate rash, facial/tongue/throat swelling, SOB or lightheadedness with hypotension: No Has patient had a PCN reaction causing severe rash involving mucus membranes or skin necrosis: No Has patient had a PCN reaction that required hospitalization No Has patient had a PCN reaction occurring within the last 10 years: No If all of the above answers are "NO", then may proceed with Cephalosporin use.  . Shellfish-Derived Products Other (See Comments)    From allergy test  . Strawberry Extract Other (See Comments)    Worsened allergy symptoms   Family History  Problem Relation Age of Onset  . Diabetes Mother   . Hypertension Mother   . Hypertension Father   . Glaucoma Sister   . Glaucoma Brother   . Allergic rhinitis Neg Hx   . Angioedema Neg Hx   . Asthma Neg Hx   . Eczema Neg Hx   . Immunodeficiency Neg Hx   . Urticaria Neg Hx       Current Outpatient Medications (Cardiovascular):  .  amLODipine (NORVASC) 10 MG tablet, Take 1 tablet (10 mg total) by mouth daily. Marland Kitchen  EPINEPHrine (EPIPEN 2-PAK) 0.3 mg/0.3 mL IJ SOAJ injection, Use as directed for severe allergic reaction .  losartan (COZAAR) 100 MG tablet, Take 1 tablet (100 mg total) by mouth daily. .  pravastatin (PRAVACHOL) 40 MG tablet, Take 1 tablet (40 mg total) by mouth daily.   Current Outpatient Medications (Respiratory):  .  albuterol (PROVENTIL) (2.5 MG/3ML) 0.083% nebulizer solution, Take 2.5 mg by nebulization 2 (two) times daily as needed for wheezing or shortness of breath. .  budesonide-formoterol (SYMBICORT) 80-4.5 MCG/ACT inhaler, Inhale 2 puffs into the lungs 2 (two) times daily. Use for asthma flares 2 puffs twice a day for 2 weeks or until cough and wheeze free .  fluticasone (FLONASE) 50 MCG/ACT nasal spray,  .  ipratropium (ATROVENT) 0.06 % nasal spray, Place 2 sprays into both nostrils 3 (three) times  daily. Marland Kitchen  loratadine (CLARITIN) 10 MG tablet, Take 10 mg by mouth daily. .  montelukast (SINGULAIR) 10 MG tablet, Take 1 tablet (10 mg total) by mouth at bedtime.  Current Facility-Administered Medications (Respiratory):  .  Benralizumab SOSY 30 mg  Current Outpatient Medications (Analgesics):  .  acetaminophen (TYLENOL) 500 MG tablet, Take 500 mg by mouth daily as needed for mild pain, moderate pain, fever or headache. For pain .  naproxen (NAPROSYN) 500 MG tablet, Take 1 tablet (500 mg total) by mouth 2 (two) times daily. .  traMADol (ULTRAM) 50 MG tablet, Take 1 tablet (50 mg total) by mouth every 6 (six) hours as needed.   Current Outpatient Medications (Hematological):  Marland Kitchen  FERREX 150 150 MG capsule, Take 1 capsule by mouth  once daily .  vitamin B-12 (CYANOCOBALAMIN) 1000 MCG tablet, Take 1,000 mcg by mouth daily.   Current Outpatient Medications (Other):  Marland Kitchen  AMBULATORY NON FORMULARY MEDICATION, Allergy inj Subcutaneous every 8 weeks .  amoxicillin-clavulanate (AUGMENTIN) 875-125 MG tablet, Take 1 tablet by mouth every 12 (twelve) hours. .  cholecalciferol (VITAMIN D) 1000 units tablet, Take 2,000 Units by mouth daily. .  clindamycin (CLEOCIN) 300 MG capsule, Take 1 capsule (300 mg total) by mouth 3 (three) times daily for 7 days. Marland Kitchen  linaclotide (LINZESS) 72 MCG capsule, Take 1 capsule (72 mcg total) by mouth daily as needed. .  Magnesium Hydroxide (PHILLIPS MILK OF MAGNESIA PO), Take 1 Dose by mouth as needed. Marland Kitchen  omeprazole (PRILOSEC) 40 MG capsule, TAKE 1 CAPSULE DAILY FOR ACID REFLUX .  polyethylene glycol powder (GLYCOLAX/MIRALAX) powder, Take 17 g by mouth daily.    Reviewed prior external information including notes and imaging from  primary care provider As well as notes that were available from care everywhere and other healthcare systems.  Past medical history, social, surgical and family history all reviewed in electronic medical record.  No pertanent information  unless stated regarding to the chief complaint.   Review of Systems:  No headache, visual changes, nausea, vomiting, diarrhea, constipation, dizziness, abdominal pain, skin rash, fevers, chills, night sweats, weight loss, swollen lymph nodes, body aches, joint swelling, chest pain, shortness of breath, mood changes. POSITIVE muscle aches  Objective  There were no vitals taken for this visit.   General: No apparent distress alert and oriented x3 mood and affect normal, dressed appropriately.  HEENT: Pupils equal, extraocular movements intact  Respiratory: Patient's speak in full sentences and does not appear short of breath  Cardiovascular: No lower extremity edema, non tender, no erythema  Neuro: Cranial nerves II through XII are intact, neurovascularly intact in all extremities with 2+ DTRs and 2+ pulses.  Gait normal with good balance and coordination.  MSK:  Non tender with full range of motion and good stability and symmetric strength and tone of shoulders, elbows, wrist, hip, knee and ankles bilaterally.     Impression and Recommendations:     The above documentation has been reviewed and is accurate and complete Jacqualin Combes       Note: This dictation was prepared with Dragon dictation along with smaller phrase technology. Any transcriptional errors that result from this process are unintentional.

## 2019-12-28 ENCOUNTER — Other Ambulatory Visit: Payer: Self-pay | Admitting: Internal Medicine

## 2019-12-29 NOTE — Telephone Encounter (Signed)
Please refill as per office routine med refill policy (all routine meds refilled for 3 mo or monthly per pt preference up to one year from last visit, then month to month grace period for 3 mo, then further med refills will have to be denied)  

## 2020-01-07 ENCOUNTER — Ambulatory Visit: Payer: Medicare HMO

## 2020-01-12 ENCOUNTER — Ambulatory Visit (INDEPENDENT_AMBULATORY_CARE_PROVIDER_SITE_OTHER): Payer: Medicare HMO

## 2020-01-12 ENCOUNTER — Other Ambulatory Visit: Payer: Self-pay

## 2020-01-12 DIAGNOSIS — J4550 Severe persistent asthma, uncomplicated: Secondary | ICD-10-CM | POA: Diagnosis not present

## 2020-01-18 NOTE — Progress Notes (Signed)
Subjective:    Patient ID: Angelica [name], female    DOB: 19-Feb-1941, 79 y.o.   MRN: [id number]  HPI The patient is here for an acute visit. She is here with her daughter.    Dizziness:  She had three episodes within the past two weeks.  Two days ago it was bad and that was her last episode.   She was walking to the bathroom and everything was spinning. She has associated blurry vision.  She denied nausea. She thinks it lasted about 5 minutes. She has some palpitations, but is not sure if that was because she was anxious.  She thinks the pravastatin may be the cause. She has been on it for a while, but she thinks it occurred when she took the medication these three times.   Prior episode 06/2017 and was seen in the ED.  Ct of her head at that time showed chronic pansinusitis, minimal chronic small vessel ischemic disease.  No acute abnormality.   Medications and allergies reviewed with patient and updated if appropriate.  Patient Active Problem List   Diagnosis Date Noted  . Left rotator cuff tear 11/12/2019  . Trigger thumb of right hand 11/12/2019  . Trigger finger, left ring finger 11/12/2019  . Left shoulder pain 09/29/2019  . Swelling of lower extremity 07/30/2019  . Greater trochanteric bursitis of left hip 04/07/2019  . Right ankle swelling 02/18/2019  . Ganglion cyst of right foot 01/16/2019  . HLD (hyperlipidemia) 01/16/2019  . 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 Urticaria 07/25/2018  . Low back pain 07/09/2018  . Hyperglycemia 07/09/2018  . Rash 10/10/2017  . Constipation 08/19/2017  . Hypokalemia 07/09/2017  . Asthma, severe persistent, well-controlled 06/04/2017  . 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 Nasal polyposis 06/04/2017  . Leukocytosis 06/04/2017  . Osteopenia 05/26/2017  . Encounter for well adult exam with abnormal findings 05/09/2017  . Glaucoma 05/09/2017  . GERD (gastroesophageal reflux disease)   . H. pylori infection   . Other  allergic rhinitis 03/20/2016  . Cough, persistent 03/20/2016  . Hypoglycemia 04/17/2015  . Malnutrition of moderate degree 04/11/2015  . SBO (small bowel obstruction) ([place]) 04/10/2015  . HTN (hypertension) 04/10/2015    Current Outpatient Medications on File Prior to Visit  Medication Sig Dispense Refill  . acetaminophen (TYLENOL) 500 MG tablet Take 500 mg by mouth daily as needed for mild pain, moderate pain, fever or headache. For pain    . albuterol (PROVENTIL) (2.5 MG/3ML) 0.083% nebulizer solution Take 2.5 mg by nebulization 2 (two) times daily as needed for wheezing or shortness of breath.    . AMBULATORY NON FORMULARY MEDICATION Allergy inj Subcutaneous every 8 weeks    . amLODipine (NORVASC) 10 MG tablet Take 1 tablet (10 mg total) by mouth daily. 90 tablet 3  . budesonide-formoterol (SYMBICORT) 80-4.5 MCG/ACT inhaler Inhale 2 puffs into the lungs 2 (two) times daily. Use for asthma flares 2 puffs twice a day for 2 weeks or until cough and wheeze free 1 Inhaler 5  . cholecalciferol (VITAMIN D) 1000 units tablet Take 2,000 Units by mouth daily.    Marland Kitchen EPINEPHrine (EPIPEN 2-PAK) 0.3 mg/0.3 mL IJ SOAJ injection Use as directed for severe allergic reaction 2 Device 1  . FERREX 150 150 MG capsule Take 1 capsule by mouth once daily 90 capsule 0  . fluticasone (FLONASE) 50 MCG/ACT nasal spray     . ipratropium (ATROVENT) 0.06 % nasal spray Place 2 sprays into both nostrils  3 (three) times daily. 15 mL 3  . linaclotide (LINZESS) 72 MCG capsule Take 1 capsule (72 mcg total) by mouth daily as needed. 30 capsule 5  . loratadine (CLARITIN) 10 MG tablet Take 10 mg by mouth daily.    Marland Kitchen losartan (COZAAR) 100 MG tablet Take 1 tablet (100 mg total) by mouth daily. 90 tablet 3  . Magnesium Hydroxide (PHILLIPS MILK OF MAGNESIA PO) Take 1 Dose by mouth as needed.    . montelukast (SINGULAIR) 10 MG tablet Take 1 tablet (10 mg total) by mouth at bedtime. 90 tablet 3  . naproxen (NAPROSYN) 500 MG tablet  Take 1 tablet (500 mg total) by mouth 2 (two) times daily. 30 tablet 0  . omeprazole (PRILOSEC) 40 MG capsule TAKE 1 CAPSULE DAILY FOR ACID REFLUX 90 capsule 3  . polyethylene glycol powder (GLYCOLAX/MIRALAX) powder Take 17 g by mouth daily.    . pravastatin (PRAVACHOL) 40 MG tablet Take 1 tablet (40 mg total) by mouth daily. 90 tablet 3  . traMADol (ULTRAM) 50 MG tablet Take 1 tablet (50 mg total) by mouth every 6 (six) hours as needed. 120 tablet 2  . vitamin B-12 (CYANOCOBALAMIN) 1000 MCG tablet Take 1,000 mcg by mouth daily.     No current facility-administered medications on file prior to visit.    Past Medical History:  Diagnosis Date  . Arthritis   . Asthma   . Bowel obstruction ([place])   . Environmental allergies   . GERD (gastroesophageal reflux disease)   . Glaucoma 05/09/2017  . H. pylori infection   . HLD (hyperlipidemia) 01/16/2019  . Hypertension   . Iron deficiency anemia   . Osteopenia 05/26/2017  . Thyroid disease   . Urticaria 07/25/2018    Past Surgical History:  Procedure Laterality Date  . ABDOMINAL HYSTERECTOMY    . BREAST BIOPSY    . COLON SURGERY    . KNEE SURGERY    . LAPAROTOMY N/A 04/18/2015   Procedure: Exploratory laparotomy with lysis of adhesions, possible bowel resection;  Surgeon: Ralene Ok, MD;  Location: [facility];  Service: General;  Laterality: N/A;  . NASAL SINUS SURGERY    . THYROID SURGERY      Social History   Socioeconomic History  . Marital status: Widowed    Spouse name: Not on file  . Number of children: 4  . Years of education: Not on file  . Highest education level: Not on file  Occupational History  . Occupation: retired  Tobacco Use  . Smoking status: Former Smoker    Types: Cigarettes  . Smokeless tobacco: Never Used  Vaping Use  . Vaping Use: Never used  Substance and Sexual Activity  . Alcohol use: No  . Drug use: No  . Sexual activity: Never  Other Topics Concern  . Not on file  Social History Narrative  .  Not on file   Social Determinants of Health   Financial Resource Strain: Low Risk   . Difficulty of Paying Living Expenses: Not hard at all  Food Insecurity: No Food Insecurity  . Worried About Charity fundraiser in the Last Year: Never true  . Ran Out of Food in the Last Year: Never true  Transportation Needs: No Transportation Needs  . Lack of Transportation (Medical): No  . Lack of Transportation (Non-Medical): No  Physical Activity: Sufficiently Active  . Days of Exercise per Week: 5 days  . Minutes of Exercise per Session: 30 min  Stress: No Stress  Concern Present  . Feeling of Stress : Not at all  Social Connections: Moderately Integrated  . Frequency of Communication with Friends and Family: More than three times a week  . Frequency of Social Gatherings with Friends and Family: More than three times a week  . Attends Religious Services: More than 4 times per year  . Active Member of Clubs or Organizations: Yes  . Attends Archivist Meetings: More than 4 times per year  . Marital Status: Widowed    Family History  Problem Relation Age of Onset  . Diabetes Mother   . Hypertension Mother   . Hypertension Father   . Glaucoma Sister   . Glaucoma Brother   . Allergic rhinitis Neg Hx   . Angioedema Neg Hx   . Asthma Neg Hx   . Eczema Neg Hx   . Immunodeficiency Neg Hx   . Urticaria Neg Hx     Review of Systems  Constitutional: Negative for chills and fever.  HENT: Negative for ear pain, sinus pressure, sinus pain and sore throat.   Eyes: Positive for visual disturbance (blurry with dizziness).  Respiratory: Negative for shortness of breath.   Cardiovascular: Positive for palpitations. Negative for chest pain.  Gastrointestinal: Negative for nausea.  Neurological: Positive for dizziness. Negative for weakness, numbness and headaches.       Objective:   Vitals:   01/19/20 0846  BP: (!) 142/80  Pulse: 76  Temp: 97.8 F (36.6 C)  SpO2: 98%   BP  Readings from Last 3 Encounters:  01/19/20 (!) 142/80  12/18/19 (!) 140/81  11/20/19 140/70   Wt Readings from Last 3 Encounters:  01/19/20 178 lb 9.6 oz (81 kg)  11/20/19 175 lb (79.4 kg)  11/20/19 175 lb (79.4 kg)   Body mass index is 29.72 kg/m.   Physical Exam Constitutional:      General: She is not in acute distress.    Appearance: Normal appearance. She is not ill-appearing.  HENT:     Head: Normocephalic and atraumatic.     Right Ear: Tympanic membrane, ear canal and external ear normal.     Left Ear: Tympanic membrane, ear canal and external ear normal.     Mouth/Throat:     Mouth: Mucous membranes are moist.     Pharynx: No posterior oropharyngeal erythema.  Eyes:     Conjunctiva/sclera: Conjunctivae normal.  Cardiovascular:     Rate and Rhythm: Normal rate and regular rhythm.     Heart sounds: Murmur (2/6 systolic) heard.   Pulmonary:     Effort: Pulmonary effort is normal.     Breath sounds: Normal breath sounds.  Musculoskeletal:     Cervical back: Neck supple. No tenderness.     Right lower leg: No edema.     Left lower leg: No edema.  Lymphadenopathy:     Cervical: No cervical adenopathy.  Skin:    General: Skin is warm and dry.  Neurological:     General: No focal deficit present.     Mental Status: She is alert.     Sensory: No sensory deficit.     Motor: No weakness.  Psychiatric:        Mood and Affect: Mood normal.            Assessment & Plan:    See Problem List for Assessment and Plan of chronic medical problems.    This visit occurred during the SARS-CoV-2 public health emergency.  Safety protocols were in  place, including screening questions prior to the visit, additional usage of staff PPE, and extensive cleaning of exam room while observing appropriate contact time as indicated for disinfecting solutions.

## 2020-01-19 ENCOUNTER — Other Ambulatory Visit: Payer: Self-pay

## 2020-01-19 ENCOUNTER — Ambulatory Visit
Admission: RE | Admit: 2020-01-19 | Discharge: 2020-01-19 | Disposition: A | Discharge status: Home / Self Care | Payer: Medicare HMO | Source: Ambulatory Visit | Attending: Internal Medicine | Admitting: Internal Medicine

## 2020-01-19 ENCOUNTER — Telehealth: Payer: Self-pay | Admitting: Internal Medicine

## 2020-01-19 ENCOUNTER — Ambulatory Visit (INDEPENDENT_AMBULATORY_CARE_PROVIDER_SITE_OTHER): Payer: Medicare HMO | Admitting: Internal Medicine

## 2020-01-19 ENCOUNTER — Encounter: Payer: Self-pay | Admitting: Internal Medicine

## 2020-01-19 VITALS — BP 142/80 | HR 76 | Temp 97.8°F | Wt 178.6 lb

## 2020-01-19 DIAGNOSIS — Z1231 Encounter for screening mammogram for malignant neoplasm of breast: Secondary | ICD-10-CM | POA: Diagnosis not present

## 2020-01-19 DIAGNOSIS — I10 Essential (primary) hypertension: Secondary | ICD-10-CM

## 2020-01-19 DIAGNOSIS — R42 Dizziness and giddiness: Secondary | ICD-10-CM | POA: Diagnosis not present

## 2020-01-19 IMAGING — MG DIGITAL SCREENING BILAT W/ TOMO W/ CAD
8 series · 8 of 24 positions shown · non-contrast
Comparison: Previous exam(s).

CLINICAL DATA: Screening.

EXAM:
DIGITAL SCREENING BILATERAL MAMMOGRAM WITH TOMO AND CAD

[L CC synth-2D]
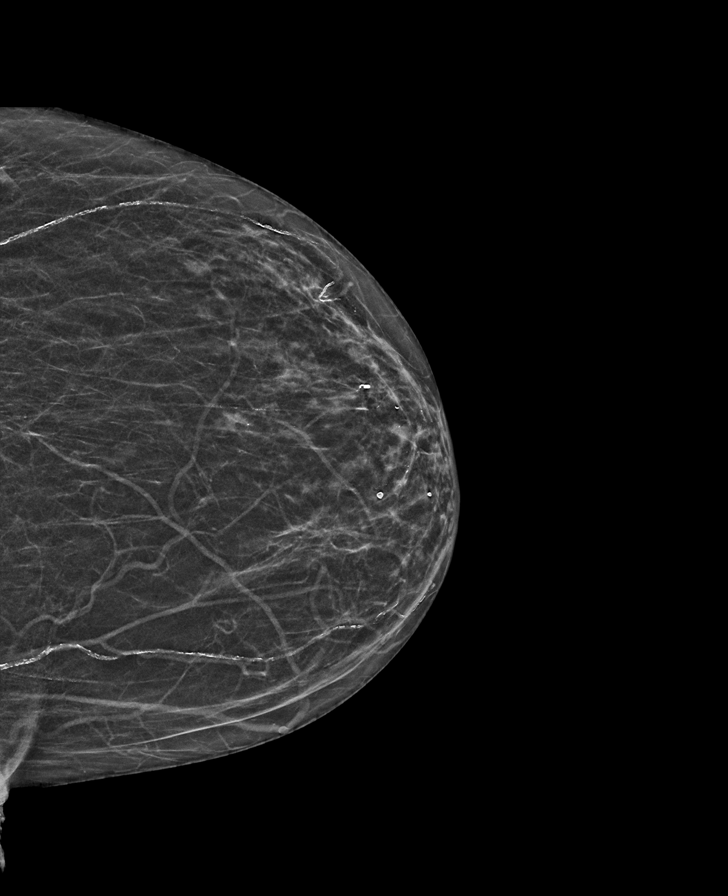

[L MLO synth-2D]
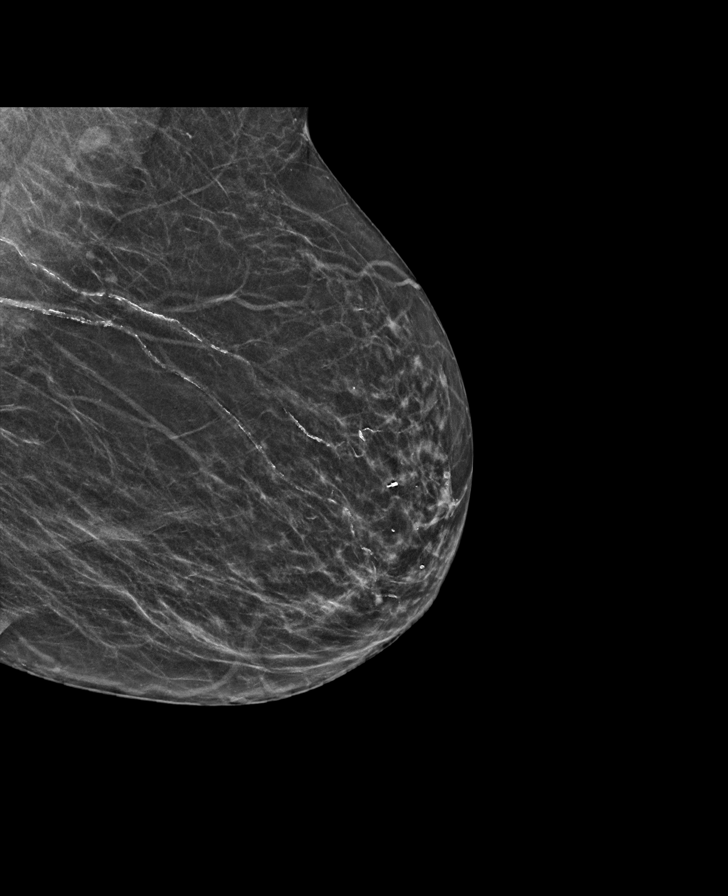

[R MLO synth-2D]
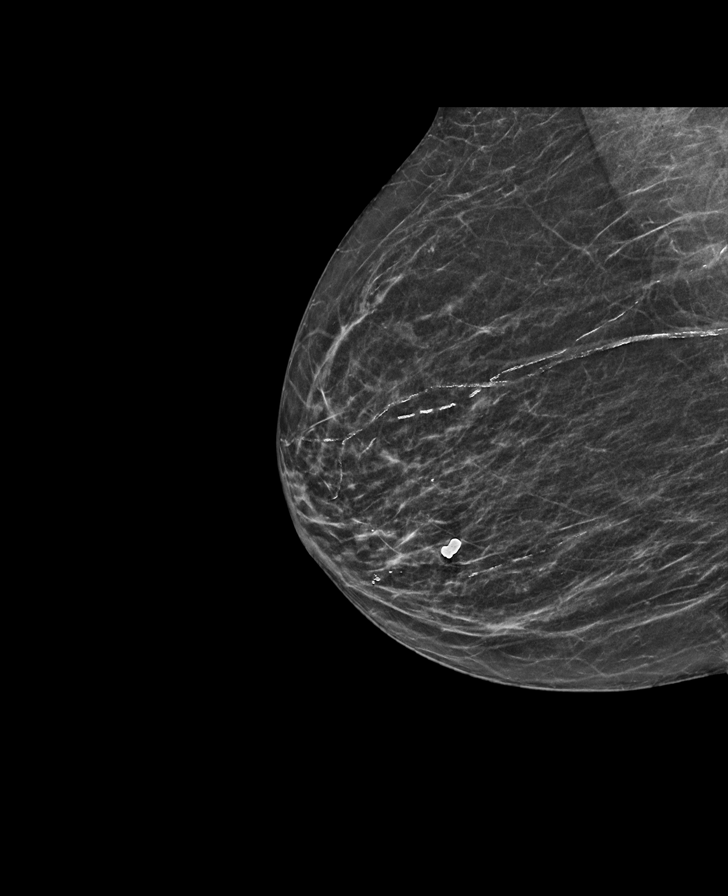

[R CC synth-2D]
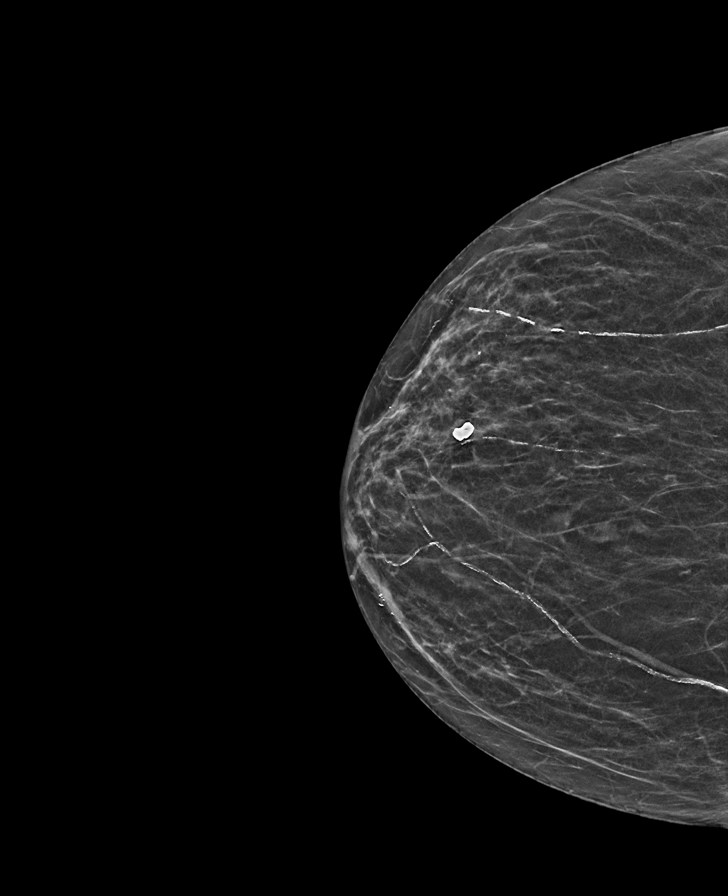

[L CC tomo · tomo slice 23/44.0]
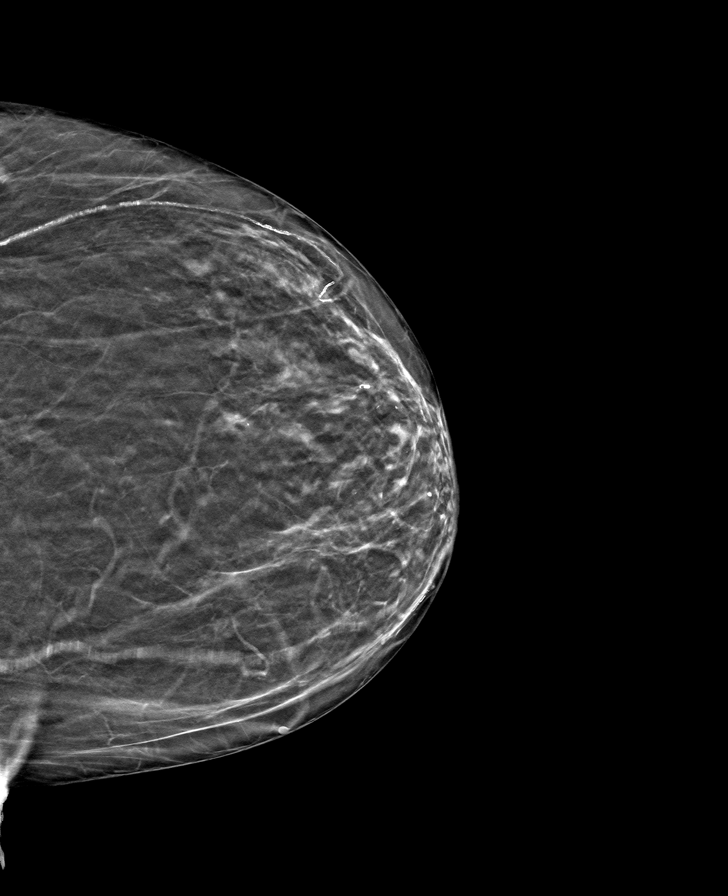

[R CC tomo · tomo slice 23/45.0]
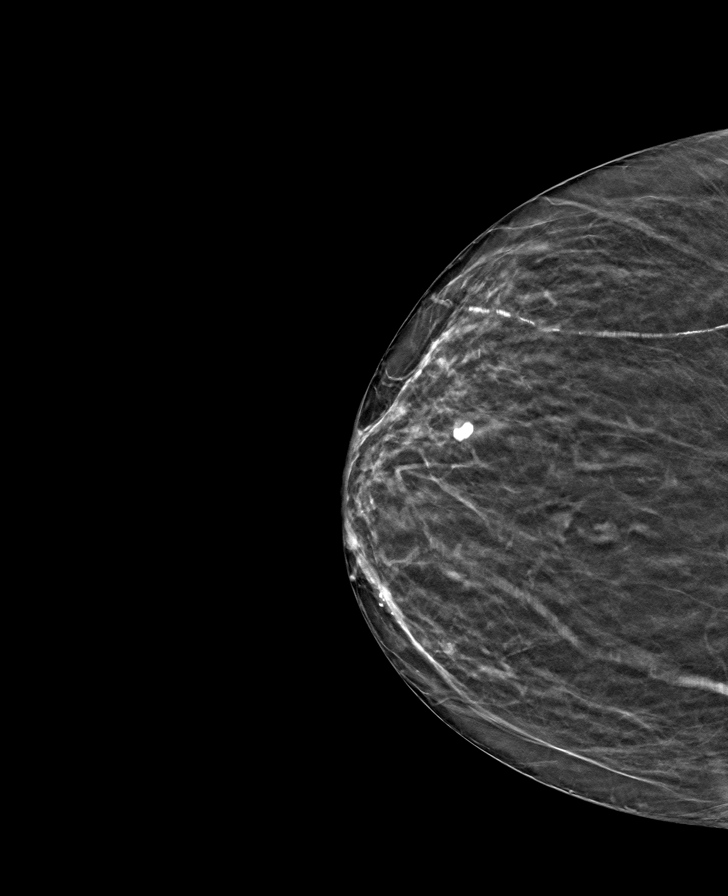

[R MLO tomo · tomo slice 27/52.0]
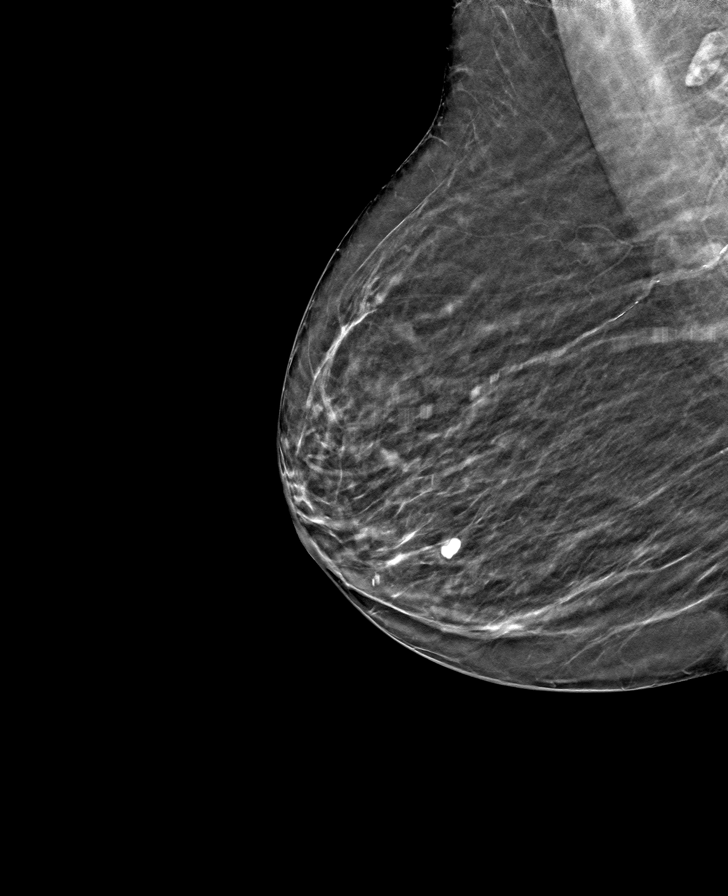

[L MLO tomo · tomo slice 27/53.0]
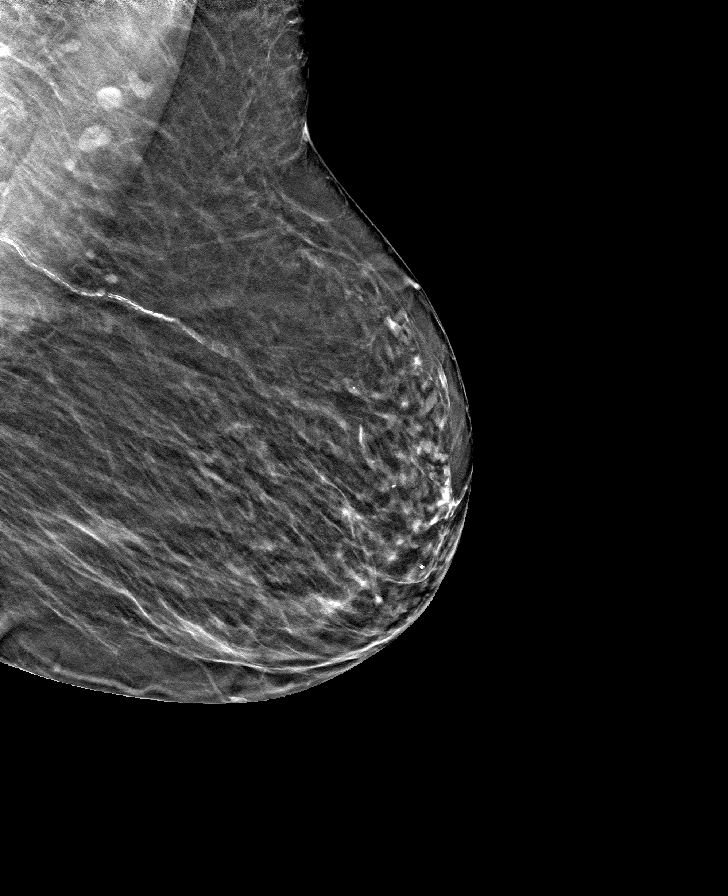

[8 of 24 positions shown; findings below may reference images not displayed]

ACR Breast Density Category b: There are scattered areas of
fibroglandular density.
FINDINGS: There are no findings suspicious for malignancy. Images were
processed with CAD.
IMPRESSION: No mammographic evidence of malignancy. A result letter of this
screening mammogram will be mailed directly to the patient.

RECOMMENDATION:
Screening mammogram in one year. (Code:[TQ])

BI-RADS CATEGORY  1: Negative.

## 2020-01-19 NOTE — Telephone Encounter (Signed)
Patient dropped off a statement of physician for insurance company.   Form has been completed and placed in providers box to review and sign.

## 2020-01-19 NOTE — Assessment & Plan Note (Signed)
Chronic BP well controlled Current regimen effective and well tolerated Continue current medications at current doses  

## 2020-01-19 NOTE — Assessment & Plan Note (Addendum)
Acute Had three episodes in past 2 weeks  She thinks it may be from the pravastatin -- seems unlikely but advised her to hold for now Some blurry vision, and palps possibly from anxiety with these symptoms associated - no other symptom No symptoms for two days Neuro exam non-focal, no change in balance.  No cold symptoms. No cardiac symptoms. EKG normal sinus rhythm at 70 bpm, LAE, normal EKG, no change from prior EKGs Ct of head ordered Hold pravastatin Stay hydrated If she continues to have episodes she may need further evaluation - advised her to f/u with PCP if symptoms recur

## 2020-01-19 NOTE — Patient Instructions (Addendum)
A CT scan was ordered.     An EKG was done today.    Please hold the pravastatin for now.    If your dizziness continues please let us know.

## 2020-01-20 NOTE — Telephone Encounter (Signed)
Form has been signed, Copy sent to scan.   LVM to inform patient &daughter that the original is ready to be picked up.

## 2020-01-21 ENCOUNTER — Ambulatory Visit
Admission: RE | Admit: 2020-01-21 | Discharge: 2020-01-21 | Disposition: A | Discharge status: Home / Self Care | Payer: Medicare HMO | Source: Ambulatory Visit | Attending: Internal Medicine | Admitting: Internal Medicine

## 2020-01-21 DIAGNOSIS — J322 Chronic ethmoidal sinusitis: Secondary | ICD-10-CM | POA: Diagnosis not present

## 2020-01-21 DIAGNOSIS — J3489 Other specified disorders of nose and nasal sinuses: Secondary | ICD-10-CM | POA: Diagnosis not present

## 2020-01-21 DIAGNOSIS — J321 Chronic frontal sinusitis: Secondary | ICD-10-CM | POA: Diagnosis not present

## 2020-01-21 DIAGNOSIS — R42 Dizziness and giddiness: Secondary | ICD-10-CM | POA: Diagnosis not present

## 2020-01-21 IMAGING — CT CT HEAD W/O CM
3 of 4 series · 14 of 47 positions shown, 16 images · non-contrast
Comparison: None.

CLINICAL DATA: Dizziness, left arm pain, worsening confusion

EXAM:
CT HEAD WITHOUT CONTRAST
TECHNIQUE: Contiguous axial images were obtained from the base of the skull
through the vertex without intravenous contrast.

[Series 2: head 5.00 hr40 s3 axial ibhc · axial · 0.43mm/px · z∈[-701,-571]mm · 8 of 32 slices shown, 10 images]
[im 3/32  brain]
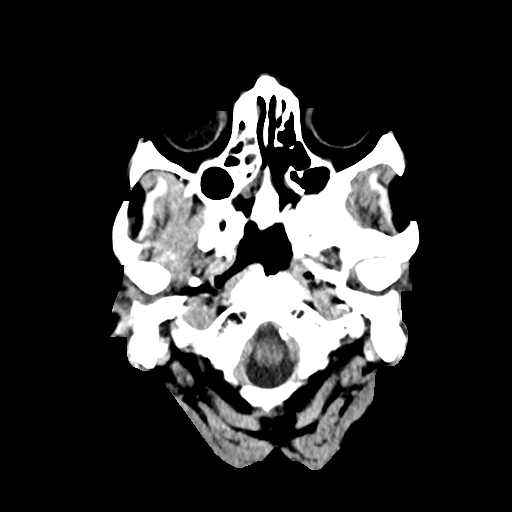
[im 3/32  bone]
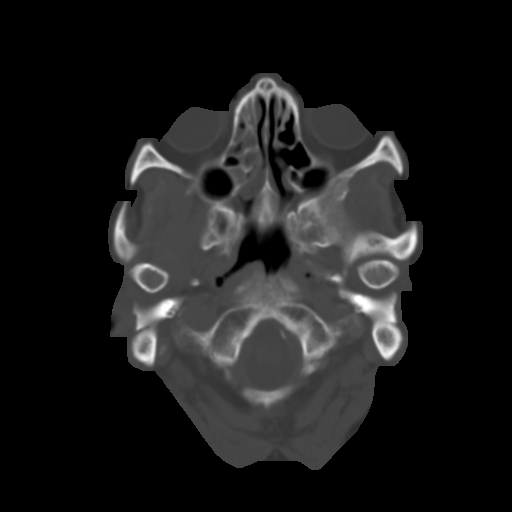
[im 7/32  brain]
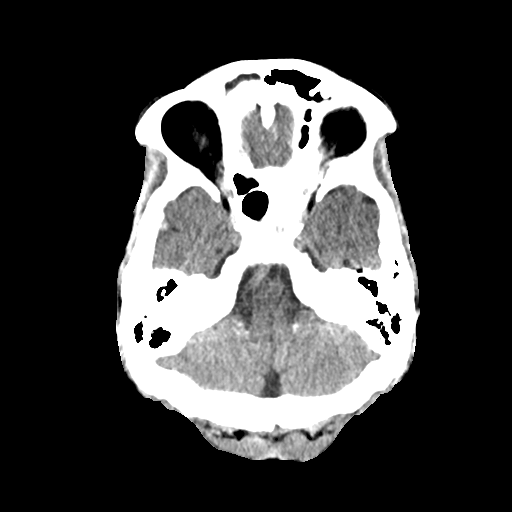
[im 12/32  brain]
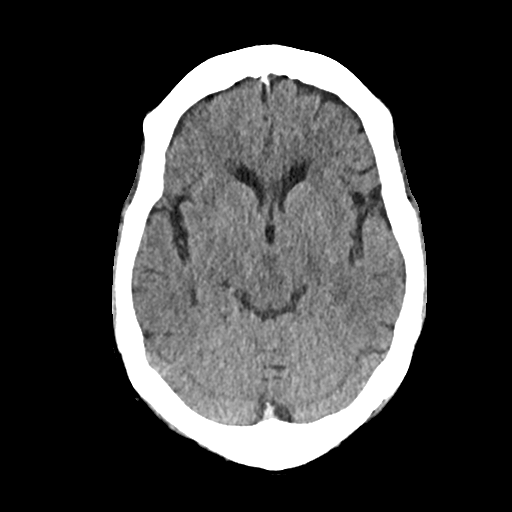
[im 14/32  brain]
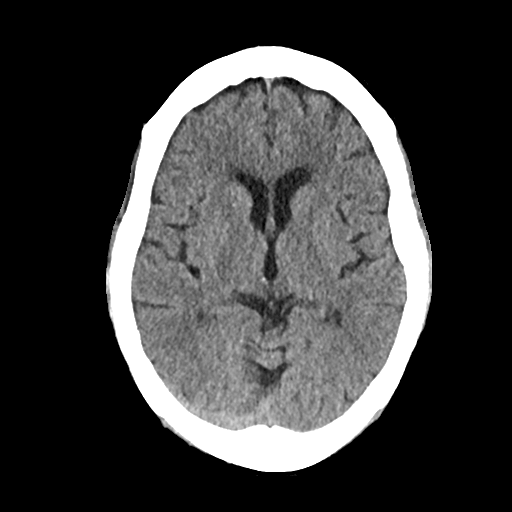
[im 18/32  brain]
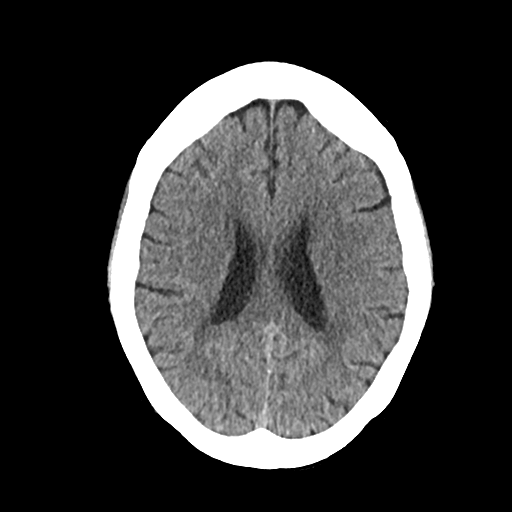
[im 18/32  bone]
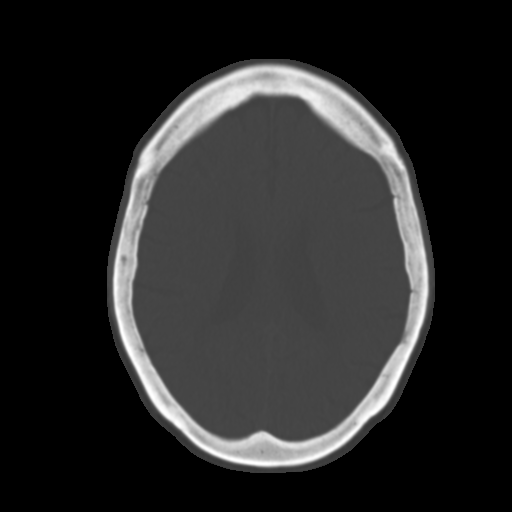
[im 20/32  brain]
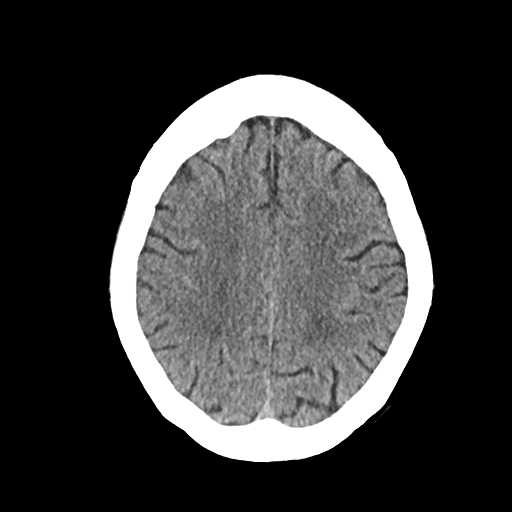
[im 25/32  brain]
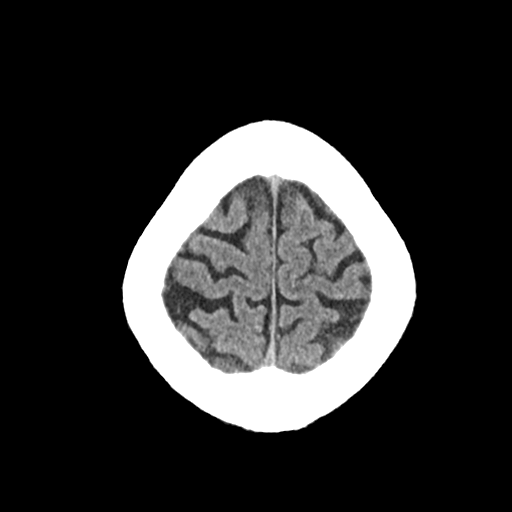
[im 29/32  brain]
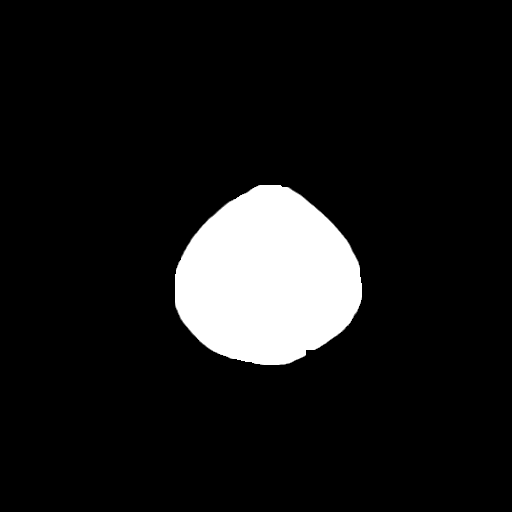

[Series 4: head 3.00 hr40 s3 sag · sagittal · 0.32mm/px · 3 of 73 slices shown]
[im 25/73  brain]
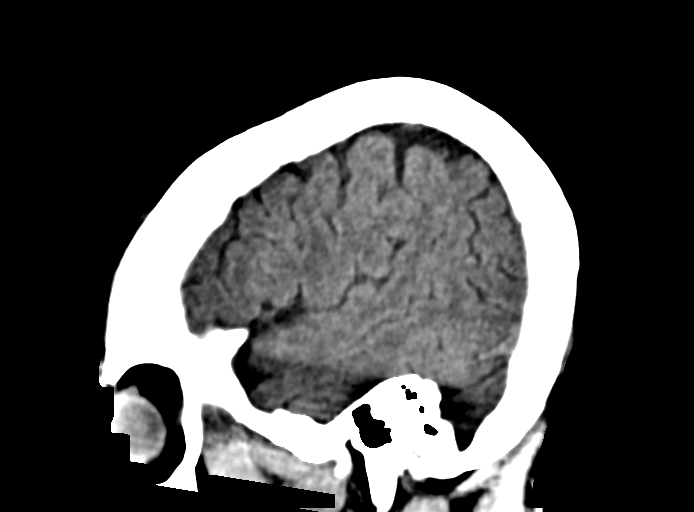
[im 37/73  brain]
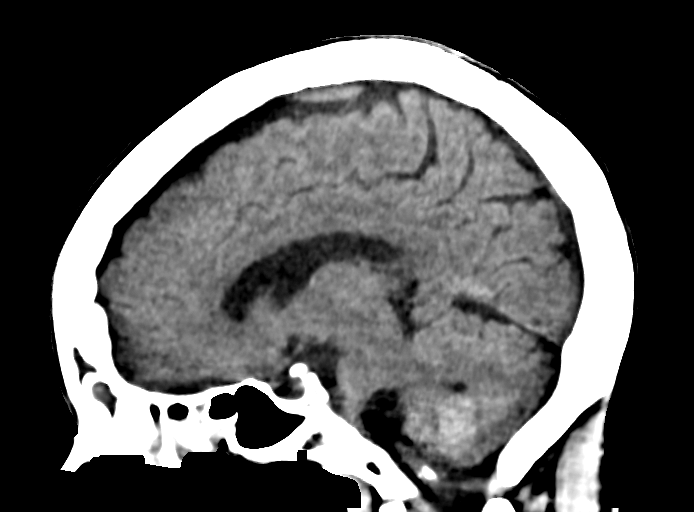
[im 49/73  brain]
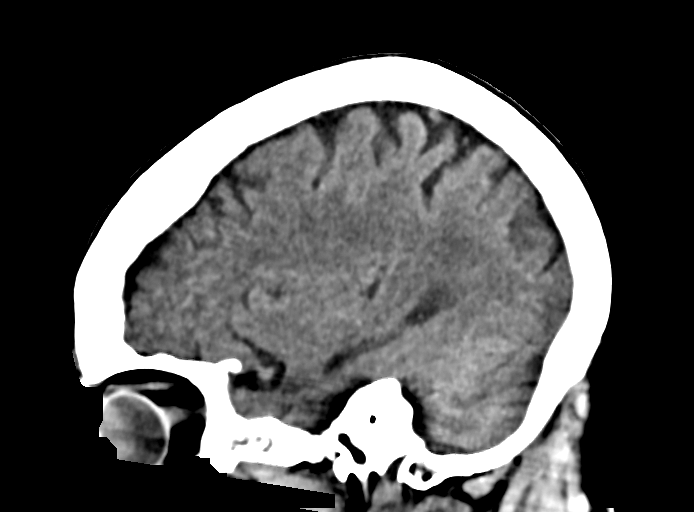

[Series 6: head 3.00 hr40 s3 cor · coronal · 0.32mm/px · 3 of 73 slices shown]
[im 25/73  brain]
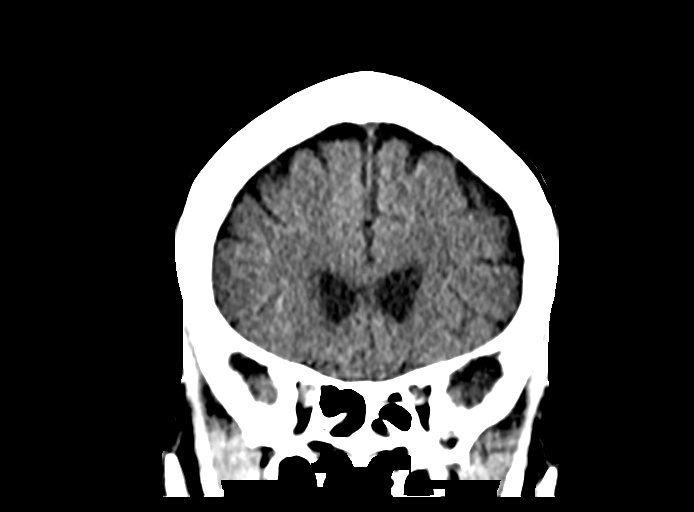
[im 33/73  brain]
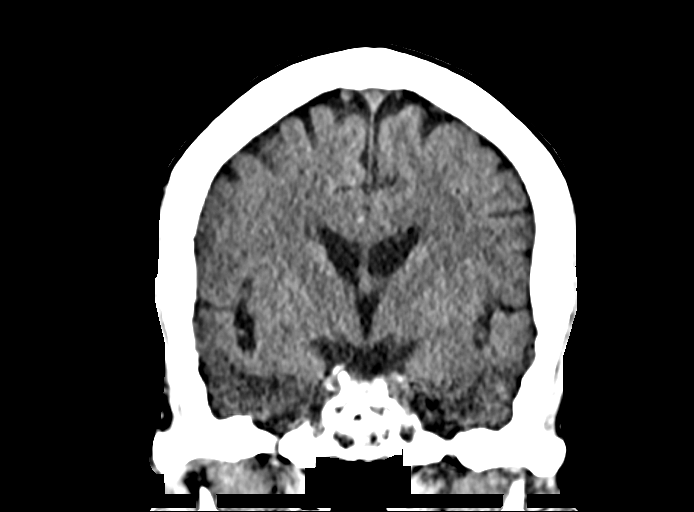
[im 41/73  brain]
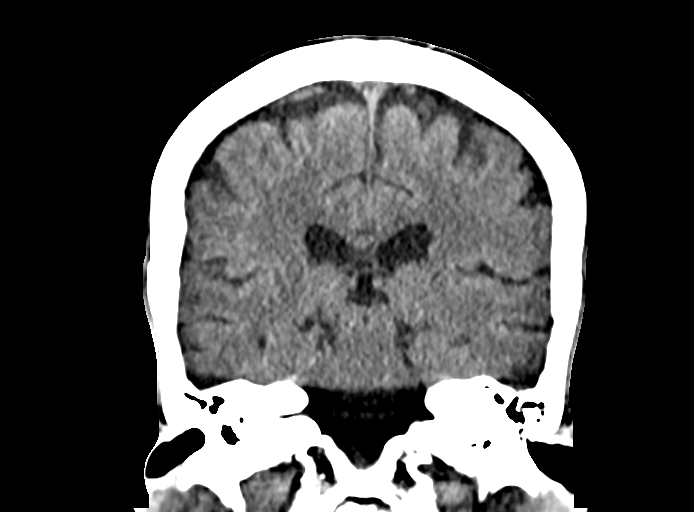

[14 of 47 positions shown; findings below may reference images not displayed]

FINDINGS: Brain: Normal anatomic configuration. Mild periventricular white
matter changes are present likely reflecting the sequela of small
vessel ischemia. No abnormal intra or extra-axial mass lesion or
fluid collection. No abnormal mass effect or midline shift. No
evidence of acute intracranial hemorrhage or infarct. Ventricular
size is normal. Cerebellum unremarkable.

Vascular: Unremarkable

Skull: Intact

Sinuses/Orbits: There is opacification of the right frontal sinus
and several ethmoid air cells bilaterally. No air-fluid levels are
identified. Orbits are unremarkable.

Other: Mastoid air cells and middle ear cavities are clear.
IMPRESSION: 1. No acute intracranial abnormality.
2. Mild periventricular white matter changes likely reflecting the
sequela of small vessel ischemia.
3. Opacification of the right frontal sinus and several ethmoid air
cells bilaterally in keeping with changes of sinusitis. This appears
slightly improved since prior examination.

## 2020-01-28 ENCOUNTER — Encounter: Payer: Self-pay | Admitting: Allergy

## 2020-01-28 ENCOUNTER — Ambulatory Visit: Payer: Medicare HMO | Admitting: Allergy

## 2020-01-28 ENCOUNTER — Other Ambulatory Visit: Payer: Self-pay

## 2020-01-28 VITALS — BP 142/60 | HR 75 | Resp 18 | Ht 65.0 in

## 2020-01-28 DIAGNOSIS — J3089 Other allergic rhinitis: Secondary | ICD-10-CM

## 2020-01-28 DIAGNOSIS — J4550 Severe persistent asthma, uncomplicated: Secondary | ICD-10-CM

## 2020-01-28 DIAGNOSIS — K219 Gastro-esophageal reflux disease without esophagitis: Secondary | ICD-10-CM

## 2020-01-28 DIAGNOSIS — J339 Nasal polyp, unspecified: Secondary | ICD-10-CM | POA: Diagnosis not present

## 2020-01-28 MED ORDER — BUDESONIDE-FORMOTEROL FUMARATE 80-4.5 MCG/ACT IN AERO: 2.0000 | INHALATION_SPRAY | Freq: Two times a day (BID) | RESPIRATORY_TRACT | 5 refills | Status: DC

## 2020-01-28 MED ORDER — EPINEPHRINE 0.3 MG/0.3ML IJ SOAJ: INTRAMUSCULAR | 1 refills | Status: DC

## 2020-01-28 NOTE — Progress Notes (Signed)
Follow-up Note  RE: Angelica [name] MRN: [id number] DOB: 1940/07/20 Date of Office Visit: 01/28/2020   History of present illness: Angelica [name] is a 79 y.o. female presenting today for follow-up of asthma, LPRD, allergic rhinitis, nasal polyps.  Also has history of hives.  She was last seen in the office on 07/24/19 by myself.  She states she has been doing well since last visit without any major changes.  She is fully vaccinated with [employer] vaccine.  She states she has not needed to use albuterol and has had no flares requiring initiation of Symbicort.  She continues on Fasenra injections every 8 weeks and states it has continued to provide good control.   She does report having nasal drainage at night that is bothersome.  She states the daytime is ok.  She uses nasal atrovent in the morning.  Was not aware she could use it more than once a day.  She is taking claritin daily.  Has flonase for congestion but has not needed to use it.   She continues to take omeprazole for reflux control.     Review of systems: Review of Systems  Constitutional: Negative.   HENT:       See HPI  Eyes: Negative.   Respiratory: Negative.   Cardiovascular: Negative.   Gastrointestinal: Negative.   Musculoskeletal: Negative.   Skin: Negative.   Neurological: Negative.     All other systems negative unless noted above in HPI  Past medical/social/surgical/family history have been reviewed and are unchanged unless specifically indicated below.  No changes  Medication List: Current Outpatient Medications  Medication Sig Dispense Refill  . acetaminophen (TYLENOL) 500 MG tablet Take 500 mg by mouth daily as needed for mild pain, moderate pain, fever or headache. For pain    . albuterol (PROVENTIL) (2.5 MG/3ML) 0.083% nebulizer solution Take 2.5 mg by nebulization 2 (two) times daily as needed for wheezing or shortness of breath.    . AMBULATORY NON FORMULARY MEDICATION Allergy inj Subcutaneous every  8 weeks    . amLODipine (NORVASC) 10 MG tablet Take 1 tablet (10 mg total) by mouth daily. 90 tablet 3  . budesonide-formoterol (SYMBICORT) 80-4.5 MCG/ACT inhaler Inhale 2 puffs into the lungs 2 (two) times daily. Use for asthma flares 2 puffs twice a day for 2 weeks or until cough and wheeze free 10.2 g 5  . cholecalciferol (VITAMIN D) 1000 units tablet Take 2,000 Units by mouth daily.    Marland Kitchen EPINEPHrine (EPIPEN 2-PAK) 0.3 mg/0.3 mL IJ SOAJ injection Use as directed for severe allergic reaction 2 each 1  . FERREX 150 150 MG capsule Take 1 capsule by mouth once daily 90 capsule 0  . ipratropium (ATROVENT) 0.06 % nasal spray Place 2 sprays into both nostrils 3 (three) times daily. 15 mL 3  . linaclotide (LINZESS) 72 MCG capsule Take 1 capsule (72 mcg total) by mouth daily as needed. 30 capsule 5  . loratadine (CLARITIN) 10 MG tablet Take 10 mg by mouth daily.    Marland Kitchen losartan (COZAAR) 100 MG tablet Take 1 tablet (100 mg total) by mouth daily. 90 tablet 3  . Magnesium Hydroxide (PHILLIPS MILK OF MAGNESIA PO) Take 1 Dose by mouth as needed.    . montelukast (SINGULAIR) 10 MG tablet Take 1 tablet (10 mg total) by mouth at bedtime. 90 tablet 3  . naproxen (NAPROSYN) 500 MG tablet Take 1 tablet (500 mg total) by mouth 2 (two) times daily. 30 tablet 0  .  omeprazole (PRILOSEC) 40 MG capsule TAKE 1 CAPSULE DAILY FOR ACID REFLUX 90 capsule 3  . polyethylene glycol powder (GLYCOLAX/MIRALAX) powder Take 17 g by mouth daily.    . pravastatin (PRAVACHOL) 40 MG tablet Take 1 tablet (40 mg total) by mouth daily. 90 tablet 3  . traMADol (ULTRAM) 50 MG tablet Take 1 tablet (50 mg total) by mouth every 6 (six) hours as needed. 120 tablet 2  . vitamin B-12 (CYANOCOBALAMIN) 1000 MCG tablet Take 1,000 mcg by mouth daily.     No current facility-administered medications for this visit.     Known medication allergies: Allergies  Allergen Reactions  . Asa Buff (Mag [Buffered Aspirin] Other (See Comments)    Excessive  sweating  . Fish Allergy Other (See Comments)    Unknown reaction  . Other Other (See Comments)    Gelcaps; gel-containing capsules; extended release tablets  . Peanut-Containing Drug Products Other (See Comments)    From allergy test  . Penicillins Itching    Has patient had a PCN reaction causing immediate rash, facial/tongue/throat swelling, SOB or lightheadedness with hypotension: No Has patient had a PCN reaction causing severe rash involving mucus membranes or skin necrosis: No Has patient had a PCN reaction that required hospitalization No Has patient had a PCN reaction occurring within the last 10 years: No If all of the above answers are "NO", then may proceed with Cephalosporin use.  . Shellfish-Derived Products Other (See Comments)    From allergy test  . Strawberry Extract Other (See Comments)    Worsened allergy symptoms     Physical examination: Blood pressure (!) 142/60, pulse 75, resp. rate 18, height 5\' 5"  (1.651 m), SpO2 98 %.  General: Alert, interactive, in no acute distress. HEENT: PERRLA, TMs pearly gray, turbinates non-edematous without discharge, post-pharynx non erythematous. Neck: Supple without lymphadenopathy. Lungs: Clear to auscultation without wheezing, rhonchi or rales. {no increased work of breathing. CV: Normal S1, S2 without murmurs. Abdomen: Nondistended, nontender. Skin: Warm and dry, without lesions or rashes. Extremities:  No clubbing, cyanosis or edema. Neuro:   Grossly intact.  Diagnositics/Labs: None today  Assessment and plan: Asthma, severe persistent -still under good control with Fasenra.  Continue current regimen.  LPRD -well controlled Allergic rhinitis -advised she could use the Atrovent before bedtime to help with her nasal drainage control at that time.  Also advised that it can be used up to 3-4 times a day if she needed to use that that much for nasal drainage control.  Nasal polyposis -she will continue use of her nasal  steroid spray Urticaria-she has had no further episodes    1. For asthma flares or upper respiratory infections begin Symbicort 80-2 puffs twice a day for 2 weeks or until you are cough and wheeze free.    2. Montelukast 10mg  -1 tablet 1 time per day  3.  Flonase -1 spray each nostril twice a day for nasal congestion if needed. Consider nasal saline rinses as needed for a stuffy nose  4.  Omeprazole 40mg  -1 tablet 1 time per day for reflux control  5. Continue Fasenra injections every 8 weeks.     6. Use Ipratropium 0.06% 2 sprays each nostril in morning and at bedtime to help with nasal drainage control   7.  If needed:   A.  Ventolin HFA 2 puffs or albuterol nebulization every 4-6 hours  B.  OTC antihistamine and OTC Mucinex DM  C. Nasal saline spray     8.  Return to clinic in 6 months or earlier if problem  I appreciate the opportunity to take part in Oluwanifemi's care. Please do not hesitate to contact me with questions.  Sincerely,   Prudy Feeler, MD Allergy/Immunology [facility]

## 2020-01-28 NOTE — Patient Instructions (Signed)
  1. For asthma flares or upper respiratory infections begin Symbicort 80-2 puffs twice a day for 2 weeks or until you are cough and wheeze free.    2. Montelukast 10mg  -1 tablet 1 time per day  3.  Flonase -1 spray each nostril twice a day for nasal congestion if needed. Consider nasal saline rinses as needed for a stuffy nose  4.  Omeprazole 40mg  -1 tablet 1 time per day for reflux control  5. Continue Fasenra injections every 8 weeks.     6. Use Ipratropium 0.06% 2 sprays each nostril in morning and at bedtime to help with nasal drainage control   7.  If needed:   A.  Ventolin HFA 2 puffs or albuterol nebulization every 4-6 hours  B.  OTC antihistamine and OTC Mucinex DM  C. Nasal saline spray     8. Return to clinic in 6 months or earlier if problem

## 2020-03-04 DIAGNOSIS — H40023 Open angle with borderline findings, high risk, bilateral: Secondary | ICD-10-CM | POA: Diagnosis not present

## 2020-03-04 DIAGNOSIS — H04123 Dry eye syndrome of bilateral lacrimal glands: Secondary | ICD-10-CM | POA: Diagnosis not present

## 2020-03-05 ENCOUNTER — Ambulatory Visit (INDEPENDENT_AMBULATORY_CARE_PROVIDER_SITE_OTHER): Payer: Medicare HMO

## 2020-03-05 DIAGNOSIS — Z23 Encounter for immunization: Secondary | ICD-10-CM | POA: Diagnosis not present

## 2020-03-07 ENCOUNTER — Other Ambulatory Visit: Payer: Self-pay | Admitting: Internal Medicine

## 2020-03-07 NOTE — Telephone Encounter (Signed)
Ok refill 90 days and 1 refill

## 2020-03-08 ENCOUNTER — Ambulatory Visit (INDEPENDENT_AMBULATORY_CARE_PROVIDER_SITE_OTHER): Payer: Medicare HMO

## 2020-03-08 ENCOUNTER — Other Ambulatory Visit: Payer: Self-pay

## 2020-03-08 DIAGNOSIS — J4550 Severe persistent asthma, uncomplicated: Secondary | ICD-10-CM

## 2020-03-08 MED ORDER — BENRALIZUMAB 30 MG/ML ~~LOC~~ SOSY
30.0000 mg | PREFILLED_SYRINGE | SUBCUTANEOUS | Status: AC
  Administered 2020-03-08 – 2024-05-06 (×27): 30 mg via SUBCUTANEOUS

## 2020-04-05 ENCOUNTER — Telehealth: Payer: Self-pay | Admitting: *Deleted

## 2020-04-05 NOTE — Telephone Encounter (Signed)
Received faxed approval for [employer] prescription savings program. Approval has been labeled and placed in bulk scanning.

## 2020-04-22 ENCOUNTER — Other Ambulatory Visit: Payer: Self-pay | Admitting: Internal Medicine

## 2020-04-27 ENCOUNTER — Ambulatory Visit: Payer: Medicare HMO | Admitting: Internal Medicine

## 2020-04-27 DIAGNOSIS — Z0289 Encounter for other administrative examinations: Secondary | ICD-10-CM

## 2020-04-28 ENCOUNTER — Other Ambulatory Visit: Payer: Self-pay | Admitting: Internal Medicine

## 2020-05-03 ENCOUNTER — Ambulatory Visit (INDEPENDENT_AMBULATORY_CARE_PROVIDER_SITE_OTHER): Payer: Medicare HMO | Admitting: Internal Medicine

## 2020-05-03 ENCOUNTER — Other Ambulatory Visit (INDEPENDENT_AMBULATORY_CARE_PROVIDER_SITE_OTHER): Payer: Medicare HMO

## 2020-05-03 ENCOUNTER — Other Ambulatory Visit: Payer: Self-pay

## 2020-05-03 ENCOUNTER — Encounter: Payer: Self-pay | Admitting: Internal Medicine

## 2020-05-03 ENCOUNTER — Ambulatory Visit (INDEPENDENT_AMBULATORY_CARE_PROVIDER_SITE_OTHER): Payer: Medicare HMO | Admitting: *Deleted

## 2020-05-03 VITALS — BP 150/86 | HR 81 | Temp 97.7°F | Ht 65.0 in | Wt 182.0 lb

## 2020-05-03 DIAGNOSIS — E7849 Other hyperlipidemia: Secondary | ICD-10-CM

## 2020-05-03 DIAGNOSIS — E559 Vitamin D deficiency, unspecified: Secondary | ICD-10-CM

## 2020-05-03 DIAGNOSIS — I10 Essential (primary) hypertension: Secondary | ICD-10-CM

## 2020-05-03 DIAGNOSIS — J4550 Severe persistent asthma, uncomplicated: Secondary | ICD-10-CM

## 2020-05-03 DIAGNOSIS — K219 Gastro-esophageal reflux disease without esophagitis: Secondary | ICD-10-CM | POA: Diagnosis not present

## 2020-05-03 DIAGNOSIS — E611 Iron deficiency: Secondary | ICD-10-CM

## 2020-05-03 DIAGNOSIS — R739 Hyperglycemia, unspecified: Secondary | ICD-10-CM

## 2020-05-03 DIAGNOSIS — K5900 Constipation, unspecified: Secondary | ICD-10-CM | POA: Diagnosis not present

## 2020-05-03 DIAGNOSIS — E538 Deficiency of other specified B group vitamins: Secondary | ICD-10-CM

## 2020-05-03 LAB — CBC WITH DIFFERENTIAL/PLATELET
Basophils Absolute: 0.1 10*3/uL (ref 0.0–0.1)
Basophils Relative: 0.7 % (ref 0.0–3.0)
Eosinophils Absolute: 0 10*3/uL (ref 0.0–0.7)
Eosinophils Relative: 0 % (ref 0.0–5.0)
HCT: 42.9 % (ref 36.0–46.0)
Hemoglobin: 14.2 g/dL (ref 12.0–15.0)
Lymphocytes Relative: 38.1 % (ref 12.0–46.0)
Lymphs Abs: 2.9 10*3/uL (ref 0.7–4.0)
MCHC: 33 g/dL (ref 30.0–36.0)
MCV: 90.3 fl (ref 78.0–100.0)
Monocytes Absolute: 0.6 10*3/uL (ref 0.1–1.0)
Monocytes Relative: 8.5 % (ref 3.0–12.0)
Neutro Abs: 4 10*3/uL (ref 1.4–7.7)
Neutrophils Relative %: 52.7 % (ref 43.0–77.0)
Platelets: 261 10*3/uL (ref 150.0–400.0)
RBC: 4.76 Mil/uL (ref 3.87–5.11)
RDW: 14.3 % (ref 11.5–15.5)
WBC: 7.6 10*3/uL (ref 4.0–10.5)

## 2020-05-03 NOTE — Patient Instructions (Signed)
Ok to stop the iron now  Please continue all other medications as before, and refills have been done if requested.  Please have the pharmacy call with any other refills you may need.  Please continue your efforts at being more active, low cholesterol diet, and weight control.  You are otherwise up to date with prevention measures today.  Please keep your appointments with your specialists as you may have planned  Please go to the LAB at the blood drawing area for the tests to be done - at the [facility] will be contacted by phone if any changes need to be made immediately.  Otherwise, you will receive a letter about your results with an explanation, but please check with MyChart first.  Please remember to sign up for MyChart if you have not done so, as this will be important to you in the future with finding out test results, communicating by private email, and scheduling acute appointments online when needed.  Please make an Appointment to return in 6 months, or sooner if needed

## 2020-05-03 NOTE — Progress Notes (Deleted)
   Subjective:    Patient ID: Angelica [name], female    DOB: 1940-08-17, 79 y.o.   MRN: [id number]  HPI    Review of Systems     Objective:   Physical Exam        Assessment & Plan:

## 2020-05-04 LAB — LIPID PANEL
Cholesterol: 208 mg/dL — ABNORMAL HIGH (ref 0–200)
HDL: 69.9 mg/dL (ref >39.00)
LDL Cholesterol: 120 mg/dL — ABNORMAL HIGH (ref 0–99)
NonHDL: 137.92
Total CHOL/HDL Ratio: 3
Triglycerides: 92 mg/dL (ref 0.0–149.0)
VLDL: 18.4 mg/dL (ref 0.0–40.0)

## 2020-05-04 LAB — URINALYSIS, ROUTINE W REFLEX MICROSCOPIC
Bilirubin Urine: NEGATIVE
Hgb urine dipstick: NEGATIVE
Ketones, ur: NEGATIVE
Leukocytes,Ua: NEGATIVE
Nitrite: NEGATIVE
RBC / HPF: NONE SEEN (ref 0–?)
Specific Gravity, Urine: 1.02 (ref 1.000–1.030)
Total Protein, Urine: NEGATIVE
Urine Glucose: NEGATIVE
Urobilinogen, UA: 0.2 (ref 0.0–1.0)
pH: 6 (ref 5.0–8.0)

## 2020-05-04 LAB — BASIC METABOLIC PANEL
BUN: 13 mg/dL (ref 6–23)
CO2: 24 mEq/L (ref 19–32)
Calcium: 9.6 mg/dL (ref 8.4–10.5)
Chloride: 103 mEq/L (ref 96–112)
Creatinine, Ser: 0.89 mg/dL (ref 0.40–1.20)
GFR: 61.26 mL/min (ref >60.00)
Glucose, Bld: 95 mg/dL (ref 70–99)
Potassium: 4 mEq/L (ref 3.5–5.1)
Sodium: 139 mEq/L (ref 135–145)

## 2020-05-04 LAB — HEPATIC FUNCTION PANEL
ALT: 10 U/L (ref 0–35)
AST: 19 U/L (ref 0–37)
Albumin: 4.3 g/dL (ref 3.5–5.2)
Alkaline Phosphatase: 91 U/L (ref 39–117)
Bilirubin, Direct: 0 mg/dL (ref 0.0–0.3)
Total Bilirubin: 0.3 mg/dL (ref 0.2–1.2)
Total Protein: 8.3 g/dL (ref 6.0–8.3)

## 2020-05-04 LAB — VITAMIN B12: Vitamin B-12: >1526 pg/mL — ABNORMAL HIGH (ref 211–911)

## 2020-05-04 LAB — IBC PANEL
Iron: 35 ug/dL — ABNORMAL LOW (ref 42–145)
Saturation Ratios: 10.3 % — ABNORMAL LOW (ref 20.0–50.0)
Transferrin: 243 mg/dL (ref 212.0–360.0)

## 2020-05-04 LAB — HEMOGLOBIN A1C: Hgb A1c MFr Bld: 5.8 % (ref 4.6–6.5)

## 2020-05-04 LAB — TSH: TSH: 3.12 u[IU]/mL (ref 0.35–4.50)

## 2020-05-04 LAB — VITAMIN D 25 HYDROXY (VIT D DEFICIENCY, FRACTURES): VITD: 49.36 ng/mL (ref 30.00–100.00)

## 2020-05-11 ENCOUNTER — Other Ambulatory Visit: Payer: Self-pay | Admitting: Internal Medicine

## 2020-05-11 ENCOUNTER — Encounter: Payer: Self-pay | Admitting: Internal Medicine

## 2020-05-11 MED ORDER — PRAVASTATIN SODIUM 80 MG PO TABS: 80.0000 mg | ORAL_TABLET | Freq: Every day | ORAL | 3 refills | Status: DC

## 2020-05-11 MED ORDER — POLYSACCHARIDE IRON COMPLEX 150 MG PO CAPS: 150.0000 mg | ORAL_CAPSULE | Freq: Every day | ORAL | 1 refills | Status: DC

## 2020-05-17 ENCOUNTER — Ambulatory Visit (INDEPENDENT_AMBULATORY_CARE_PROVIDER_SITE_OTHER): Payer: Medicare HMO | Admitting: Internal Medicine

## 2020-05-17 ENCOUNTER — Inpatient Hospital Stay (HOSPITAL_COMMUNITY): Payer: Medicare HMO

## 2020-05-17 ENCOUNTER — Other Ambulatory Visit: Payer: Self-pay

## 2020-05-17 ENCOUNTER — Inpatient Hospital Stay (HOSPITAL_COMMUNITY)
Admission: EM | Admit: 2020-05-17 | Discharge: 2020-06-03 | DRG: 329 | Disposition: A | Discharge status: Home Health | Payer: Medicare HMO | Source: Ambulatory Visit | Attending: Family Medicine | Admitting: Family Medicine

## 2020-05-17 ENCOUNTER — Ambulatory Visit (INDEPENDENT_AMBULATORY_CARE_PROVIDER_SITE_OTHER): Payer: Medicare HMO

## 2020-05-17 ENCOUNTER — Emergency Department (HOSPITAL_COMMUNITY): Payer: Medicare HMO

## 2020-05-17 ENCOUNTER — Encounter: Payer: Self-pay | Admitting: Internal Medicine

## 2020-05-17 ENCOUNTER — Encounter (HOSPITAL_COMMUNITY): Payer: Self-pay

## 2020-05-17 VITALS — BP 140/82 | HR 84 | Temp 97.7°F | Ht 65.0 in | Wt 180.0 lb

## 2020-05-17 DIAGNOSIS — Z87891 Personal history of nicotine dependence: Secondary | ICD-10-CM | POA: Diagnosis not present

## 2020-05-17 DIAGNOSIS — J4550 Severe persistent asthma, uncomplicated: Secondary | ICD-10-CM | POA: Diagnosis not present

## 2020-05-17 DIAGNOSIS — Z83511 Family history of glaucoma: Secondary | ICD-10-CM | POA: Diagnosis not present

## 2020-05-17 DIAGNOSIS — M8580 Other specified disorders of bone density and structure, unspecified site: Secondary | ICD-10-CM | POA: Diagnosis present

## 2020-05-17 DIAGNOSIS — R2689 Other abnormalities of gait and mobility: Secondary | ICD-10-CM | POA: Diagnosis not present

## 2020-05-17 DIAGNOSIS — R52 Pain, unspecified: Secondary | ICD-10-CM | POA: Diagnosis not present

## 2020-05-17 DIAGNOSIS — Z4682 Encounter for fitting and adjustment of non-vascular catheter: Secondary | ICD-10-CM | POA: Diagnosis not present

## 2020-05-17 DIAGNOSIS — K219 Gastro-esophageal reflux disease without esophagitis: Secondary | ICD-10-CM | POA: Diagnosis present

## 2020-05-17 DIAGNOSIS — Z91048 Other nonmedicinal substance allergy status: Secondary | ICD-10-CM

## 2020-05-17 DIAGNOSIS — Z833 Family history of diabetes mellitus: Secondary | ICD-10-CM

## 2020-05-17 DIAGNOSIS — Z0189 Encounter for other specified special examinations: Secondary | ICD-10-CM

## 2020-05-17 DIAGNOSIS — H409 Unspecified glaucoma: Secondary | ICD-10-CM | POA: Diagnosis present

## 2020-05-17 DIAGNOSIS — K6389 Other specified diseases of intestine: Secondary | ICD-10-CM | POA: Diagnosis not present

## 2020-05-17 DIAGNOSIS — J9809 Other diseases of bronchus, not elsewhere classified: Secondary | ICD-10-CM | POA: Diagnosis not present

## 2020-05-17 DIAGNOSIS — R10826 Epigastric rebound abdominal tenderness: Secondary | ICD-10-CM

## 2020-05-17 DIAGNOSIS — Z8249 Family history of ischemic heart disease and other diseases of the circulatory system: Secondary | ICD-10-CM

## 2020-05-17 DIAGNOSIS — K5909 Other constipation: Secondary | ICD-10-CM | POA: Diagnosis present

## 2020-05-17 DIAGNOSIS — D509 Iron deficiency anemia, unspecified: Secondary | ICD-10-CM | POA: Diagnosis present

## 2020-05-17 DIAGNOSIS — R918 Other nonspecific abnormal finding of lung field: Secondary | ICD-10-CM | POA: Diagnosis not present

## 2020-05-17 DIAGNOSIS — R7989 Other specified abnormal findings of blood chemistry: Secondary | ICD-10-CM | POA: Diagnosis not present

## 2020-05-17 DIAGNOSIS — L929 Granulomatous disorder of the skin and subcutaneous tissue, unspecified: Secondary | ICD-10-CM | POA: Diagnosis not present

## 2020-05-17 DIAGNOSIS — I10 Essential (primary) hypertension: Secondary | ICD-10-CM | POA: Diagnosis not present

## 2020-05-17 DIAGNOSIS — Z8719 Personal history of other diseases of the digestive system: Secondary | ICD-10-CM

## 2020-05-17 DIAGNOSIS — U071 COVID-19: Secondary | ICD-10-CM | POA: Diagnosis present

## 2020-05-17 DIAGNOSIS — R109 Unspecified abdominal pain: Secondary | ICD-10-CM | POA: Diagnosis not present

## 2020-05-17 DIAGNOSIS — R1110 Vomiting, unspecified: Secondary | ICD-10-CM | POA: Diagnosis not present

## 2020-05-17 DIAGNOSIS — E079 Disorder of thyroid, unspecified: Secondary | ICD-10-CM | POA: Diagnosis present

## 2020-05-17 DIAGNOSIS — R410 Disorientation, unspecified: Secondary | ICD-10-CM | POA: Diagnosis not present

## 2020-05-17 DIAGNOSIS — Z90710 Acquired absence of both cervix and uterus: Secondary | ICD-10-CM

## 2020-05-17 DIAGNOSIS — Z9889 Other specified postprocedural states: Secondary | ICD-10-CM

## 2020-05-17 DIAGNOSIS — K56609 Unspecified intestinal obstruction, unspecified as to partial versus complete obstruction: Secondary | ICD-10-CM

## 2020-05-17 DIAGNOSIS — Y838 Other surgical procedures as the cause of abnormal reaction of the patient, or of later complication, without mention of misadventure at the time of the procedure: Secondary | ICD-10-CM | POA: Diagnosis not present

## 2020-05-17 DIAGNOSIS — E785 Hyperlipidemia, unspecified: Secondary | ICD-10-CM | POA: Diagnosis present

## 2020-05-17 DIAGNOSIS — K565 Intestinal adhesions [bands], unspecified as to partial versus complete obstruction: Principal | ICD-10-CM | POA: Diagnosis not present

## 2020-05-17 DIAGNOSIS — R911 Solitary pulmonary nodule: Secondary | ICD-10-CM | POA: Diagnosis present

## 2020-05-17 DIAGNOSIS — Z79899 Other long term (current) drug therapy: Secondary | ICD-10-CM | POA: Diagnosis not present

## 2020-05-17 DIAGNOSIS — K5669 Other partial intestinal obstruction: Secondary | ICD-10-CM | POA: Diagnosis not present

## 2020-05-17 DIAGNOSIS — Z7951 Long term (current) use of inhaled steroids: Secondary | ICD-10-CM | POA: Diagnosis not present

## 2020-05-17 DIAGNOSIS — K5651 Intestinal adhesions [bands], with partial obstruction: Secondary | ICD-10-CM | POA: Diagnosis not present

## 2020-05-17 DIAGNOSIS — K5939 Other megacolon: Secondary | ICD-10-CM | POA: Diagnosis not present

## 2020-05-17 DIAGNOSIS — R140 Abdominal distension (gaseous): Secondary | ICD-10-CM | POA: Diagnosis not present

## 2020-05-17 DIAGNOSIS — Z5331 Laparoscopic surgical procedure converted to open procedure: Secondary | ICD-10-CM

## 2020-05-17 DIAGNOSIS — R531 Weakness: Secondary | ICD-10-CM | POA: Diagnosis not present

## 2020-05-17 DIAGNOSIS — K9172 Accidental puncture and laceration of a digestive system organ or structure during other procedure: Secondary | ICD-10-CM | POA: Diagnosis not present

## 2020-05-17 DIAGNOSIS — K760 Fatty (change of) liver, not elsewhere classified: Secondary | ICD-10-CM | POA: Diagnosis not present

## 2020-05-17 DIAGNOSIS — G8929 Other chronic pain: Secondary | ICD-10-CM | POA: Diagnosis present

## 2020-05-17 DIAGNOSIS — K5989 Other specified functional intestinal disorders: Secondary | ICD-10-CM | POA: Diagnosis not present

## 2020-05-17 DIAGNOSIS — R1084 Generalized abdominal pain: Secondary | ICD-10-CM | POA: Diagnosis not present

## 2020-05-17 DIAGNOSIS — Z20822 Contact with and (suspected) exposure to covid-19: Secondary | ICD-10-CM | POA: Diagnosis present

## 2020-05-17 DIAGNOSIS — Z880 Allergy status to penicillin: Secondary | ICD-10-CM

## 2020-05-17 DIAGNOSIS — R590 Localized enlarged lymph nodes: Secondary | ICD-10-CM | POA: Diagnosis not present

## 2020-05-17 DIAGNOSIS — Z886 Allergy status to analgesic agent status: Secondary | ICD-10-CM

## 2020-05-17 LAB — CBC
HCT: 45.5 % (ref 36.0–46.0)
Hemoglobin: 15.1 g/dL — ABNORMAL HIGH (ref 12.0–15.0)
MCH: 29.8 pg (ref 26.0–34.0)
MCHC: 33.2 g/dL (ref 30.0–36.0)
MCV: 89.9 fL (ref 80.0–100.0)
Platelets: 274 10*3/uL (ref 150–400)
RBC: 5.06 MIL/uL (ref 3.87–5.11)
RDW: 13.5 % (ref 11.5–15.5)
WBC: 6.5 10*3/uL (ref 4.0–10.5)
nRBC: 0 % (ref 0.0–0.2)

## 2020-05-17 LAB — COMPREHENSIVE METABOLIC PANEL
ALT: 15 U/L (ref 0–44)
AST: 28 U/L (ref 15–41)
Albumin: 4 g/dL (ref 3.5–5.0)
Alkaline Phosphatase: 91 U/L (ref 38–126)
Anion gap: 13 (ref 5–15)
BUN: 10 mg/dL (ref 8–23)
CO2: 22 mmol/L (ref 22–32)
Calcium: 9.8 mg/dL (ref 8.9–10.3)
Chloride: 100 mmol/L (ref 98–111)
Creatinine, Ser: 0.79 mg/dL (ref 0.44–1.00)
GFR, Estimated: >60 mL/min (ref >60)
Glucose, Bld: 141 mg/dL — ABNORMAL HIGH (ref 70–99)
Potassium: 3.7 mmol/L (ref 3.5–5.1)
Sodium: 135 mmol/L (ref 135–145)
Total Bilirubin: 0.5 mg/dL (ref 0.3–1.2)
Total Protein: 8 g/dL (ref 6.5–8.1)

## 2020-05-17 LAB — RESP PANEL BY RT-PCR (FLU A&B, COVID) ARPGX2
Influenza A by PCR: NEGATIVE
Influenza B by PCR: NEGATIVE
SARS Coronavirus 2 by RT PCR: POSITIVE — AB

## 2020-05-17 LAB — LIPASE, BLOOD: Lipase: 24 U/L (ref 11–51)

## 2020-05-17 IMAGING — CT CT CHEST W/ CM
2 of 4 series · 14 of 36 positions shown, 17 images · non-contrast
Comparison: none

CLINICAL DATA: [DATE], abdominal CT.

[Series 5: chest with 2mm st cor · coronal · 0.58mm/px · 3 of 133 slices shown]
[im 27/133  lung]
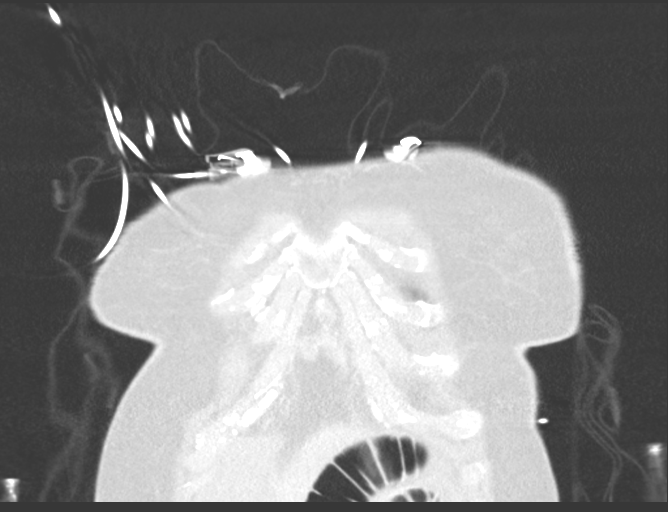
[im 53/133  lung]
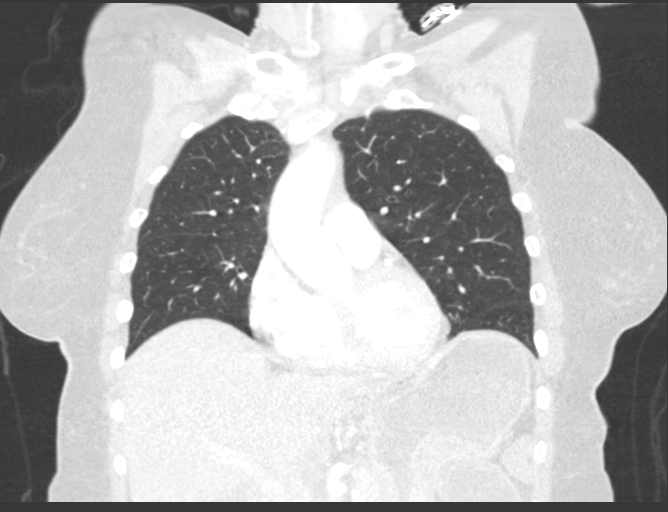
[im 80/133  lung]
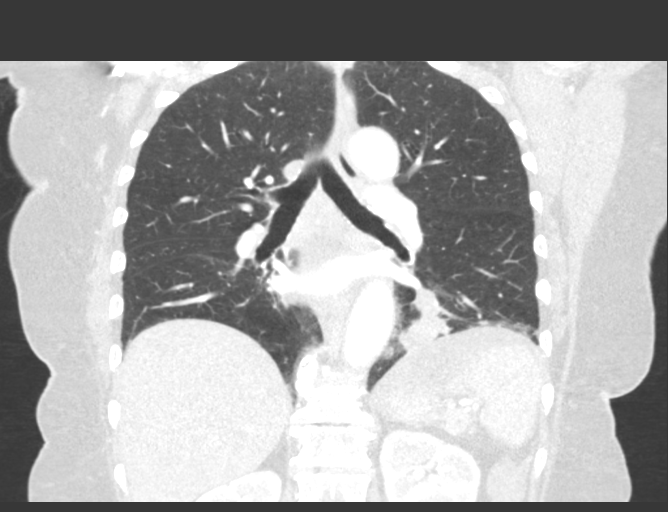

[Series 7: chest with 1mm st · axial · 0.83mm/px · z∈[+1100,+1355]mm · 11 of 287 slices shown, 14 images]
[im 16/287  mediastinal]
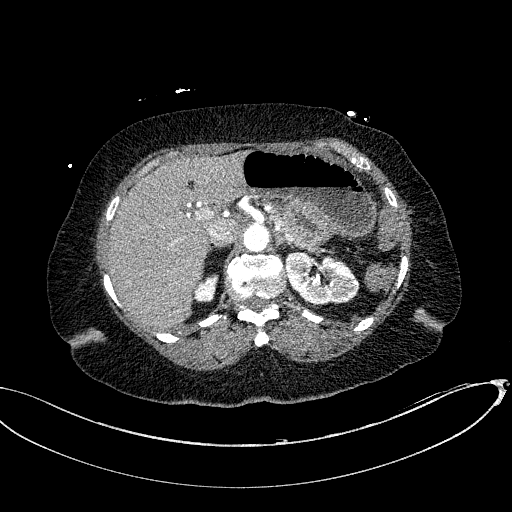
[im 16/287  lung]
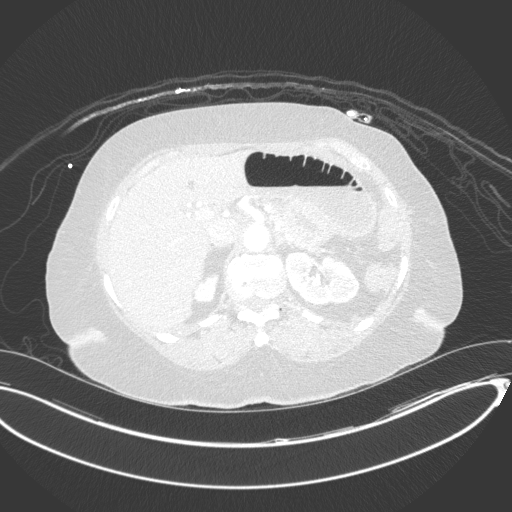
[im 48/287  lung]
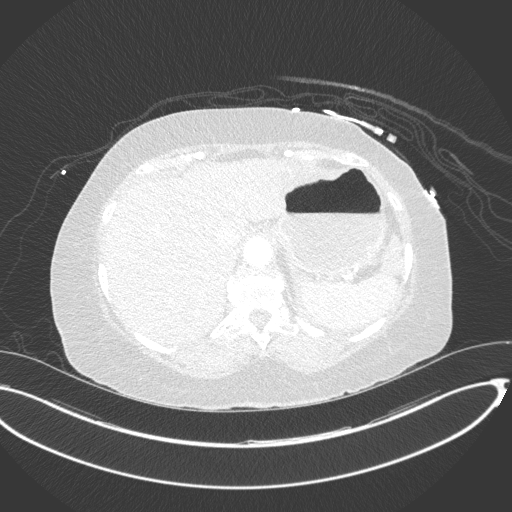
[im 64/287  lung]
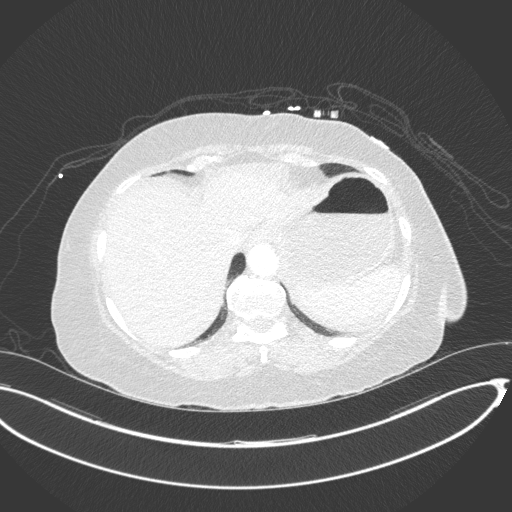
[im 96/287  lung]
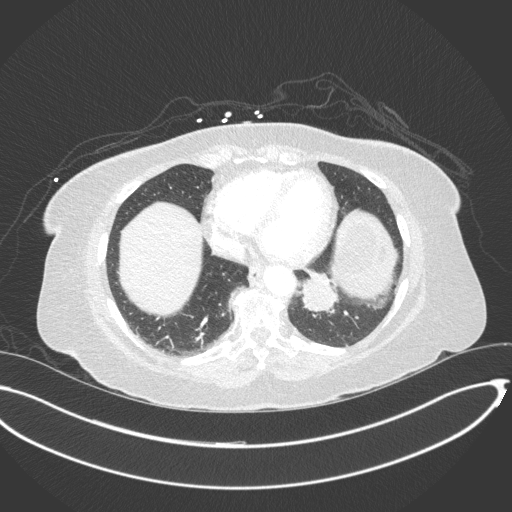
[im 112/287  mediastinal]
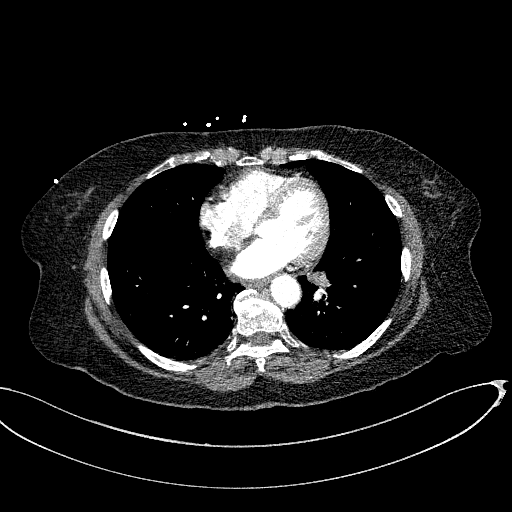
[im 112/287  lung]
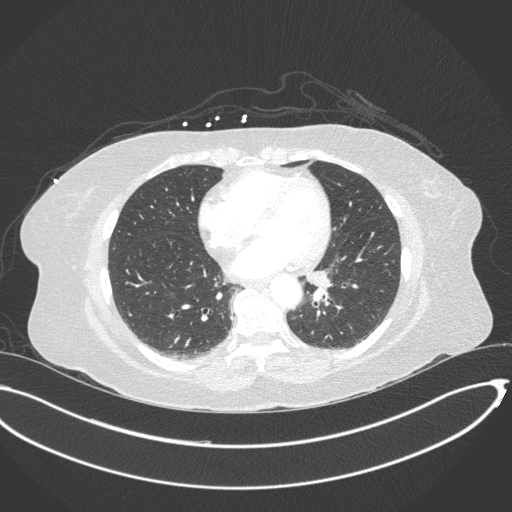
[im 144/287  lung]
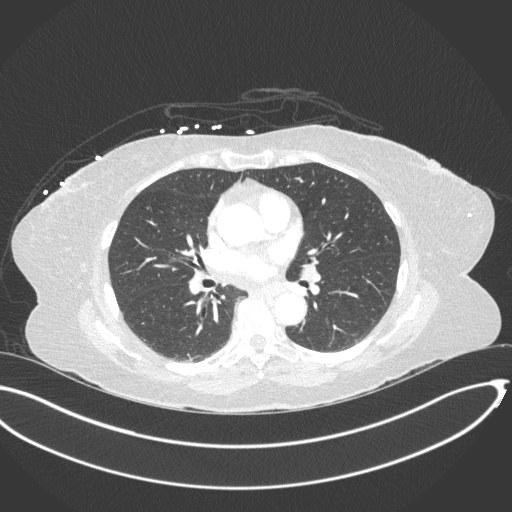
[im 175/287  lung]
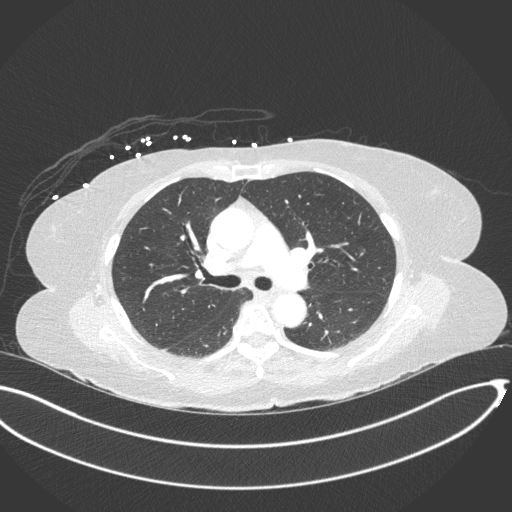
[im 191/287  lung]
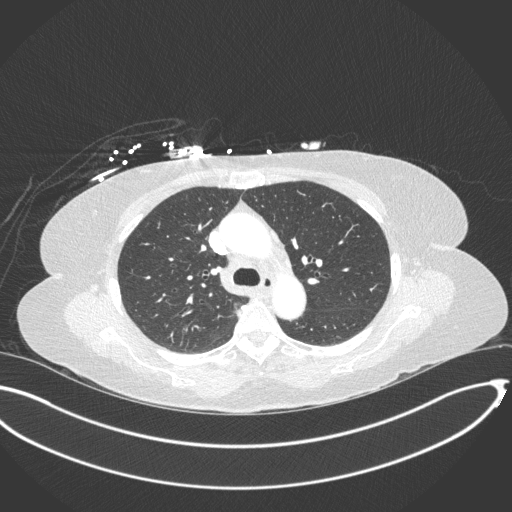
[im 223/287  mediastinal]
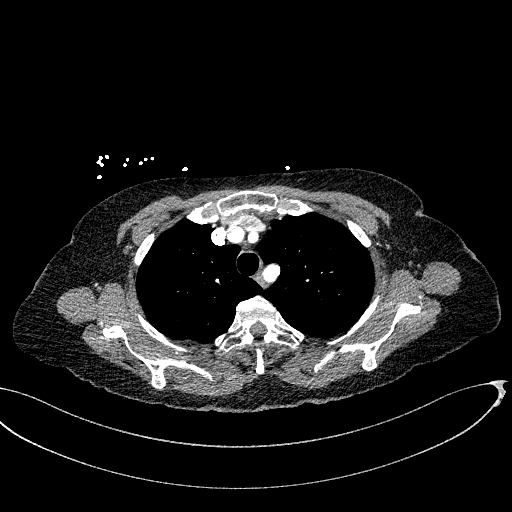
[im 223/287  lung]
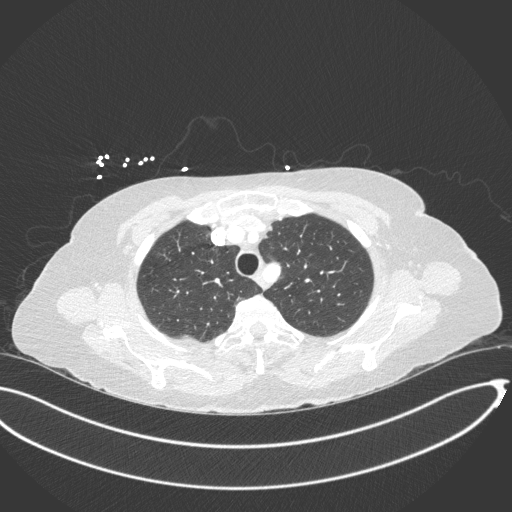
[im 239/287  lung]
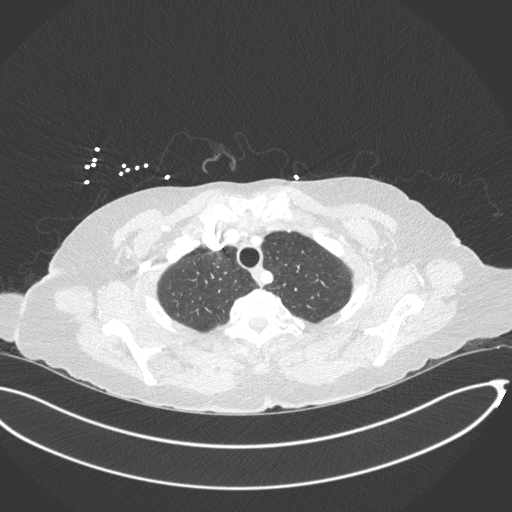
[im 271/287  lung]
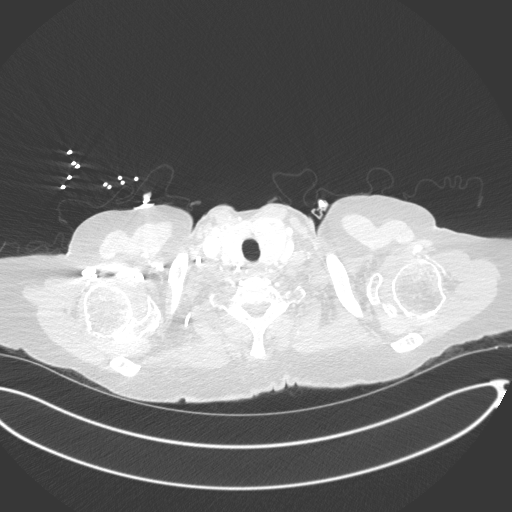

[14 of 36 positions shown; findings below may reference images not displayed]

FINDINGS: Cardiovascular: Atheromatous plaque in the thoracic aorta. No
aneurysmal dilation. Noncalcified plaque as well. Normal heart size.
Three-vessel coronary artery disease.

Central pulmonary vasculature unremarkable on venous phase
assessment.

Mediastinum/Nodes: Heterogeneous and enlarged thyroid gland,
multinodular changes.

No axillary lymphadenopathy.

Enlarged peribronchial lymph node on image 69 of series 3 a 13 mm.

Borderline enlarged LEFT infrahilar lymph node at 9 mm on image 71
of series 3.

Subcarinal lymph node at 14 mm on image 61 of series 3.

No additional mediastinal or hilar nodal enlargement. Esophagus
mildly patulous.

Lungs/Pleura: Pulmonary mass measuring 3.5 x 2.8 cm on image 94 of
series 4 with spiculated margins contacting the visceral pleura in
the LEFT lower lobe, situated in the medial aspect of the LEFT lower
lobe.

Direct extension of the mass versus is infra hilar lymph node on
image 88 of series 4 measuring 13 mm. Bronchial narrowing of
secondary bronchial structures at the level of the mass beneath the
LEFT hilum. Airways are otherwise patent.

Tiny nodule in the RIGHT chest on image 75 series 4 approximately
2-3 mm.

Upper Abdomen: Incidental imaging of upper abdominal contents again
with small amount of perisplenic fluid and marked small bowel
dilation. Visible adrenal glands are normal. There is excreted
contrast within the kidneys partially visualized.

Musculoskeletal: No acute musculoskeletal finding. No destructive
bone lesion. Spinal degenerative changes.
IMPRESSION: 1. LEFT lower lobe mass with LEFT hilar and mediastinal adenopathy
most suggestive of bronchogenic carcinoma. Referral to multi
disciplinary thoracic oncologic setting with PET or biopsy for
further assessment is suggested.
2. Tiny 2-3 mm nodule in the RIGHT chest on image 75 series 4.
Attention on follow-up.
3. Signs of bowel obstruction and fluid in the upper abdomen about
the spleen partially imaged.
4. Enlarged and heterogeneous thyroid. Recommend thyroid ultrasound
(ref: [HOSPITAL]. [DATE]): 143-50).
5. Three-vessel coronary artery disease.
6. Aortic atherosclerosis

Aortic Atherosclerosis ([IC]-[IC]).

## 2020-05-17 IMAGING — DX DG ABDOMEN ACUTE W/ 1V CHEST
3 series · 3 of 3 positions shown · non-contrast
Comparison: [DATE]

CLINICAL DATA: Chronic upper abdominal pain

EXAM:
DG ABDOMEN ACUTE WITH 1 VIEW CHEST

[chest pa]
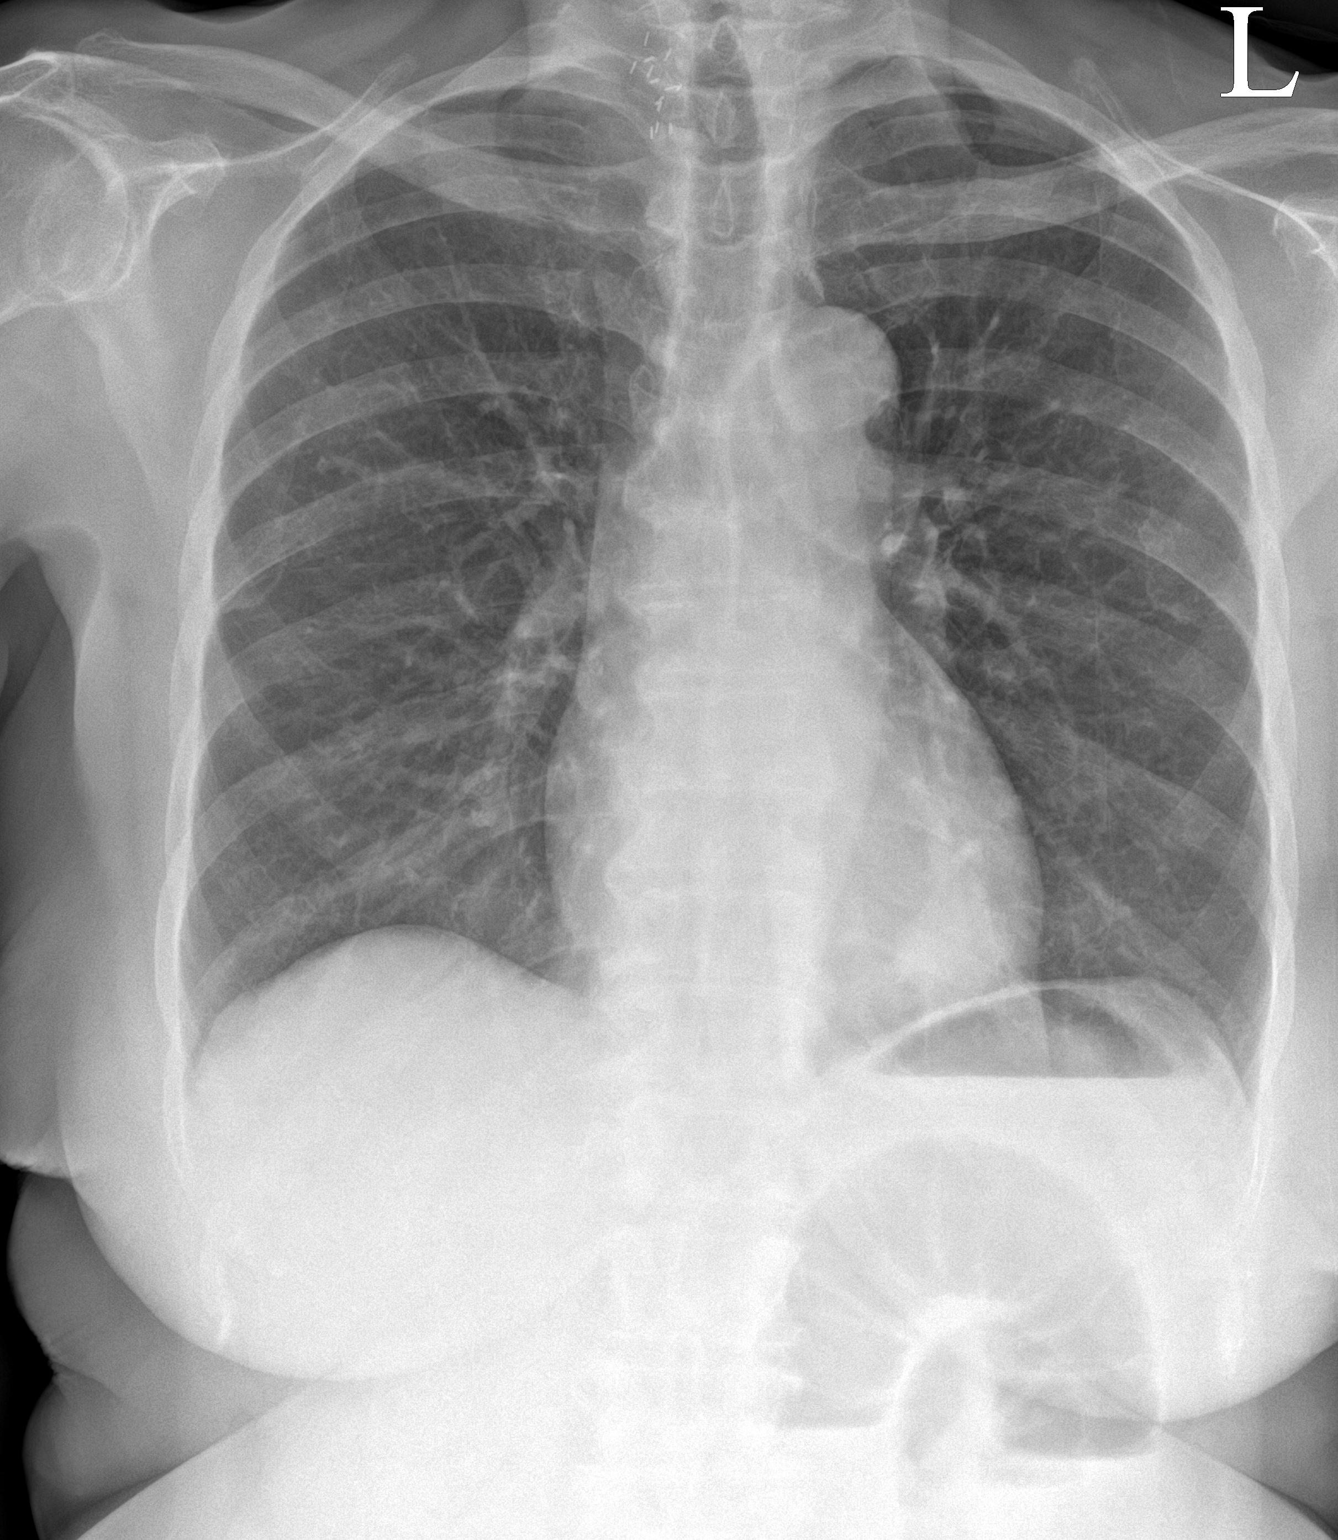

[abdomen erect]
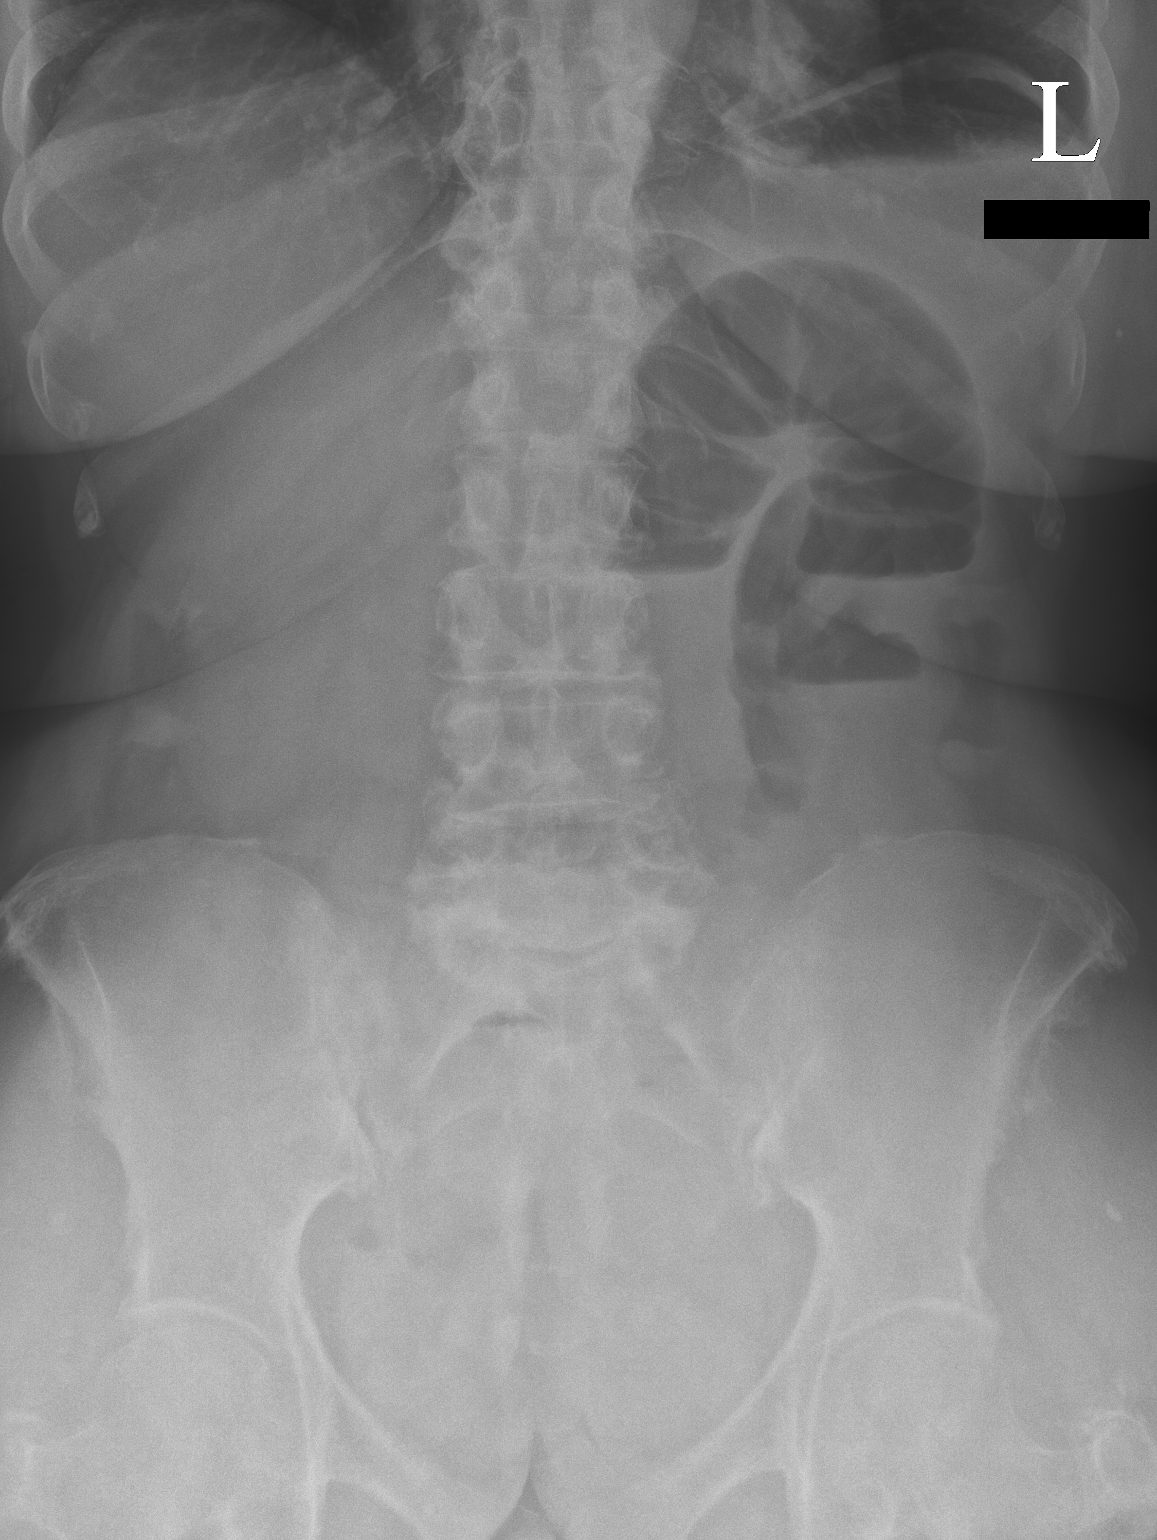

[abdomen supine]
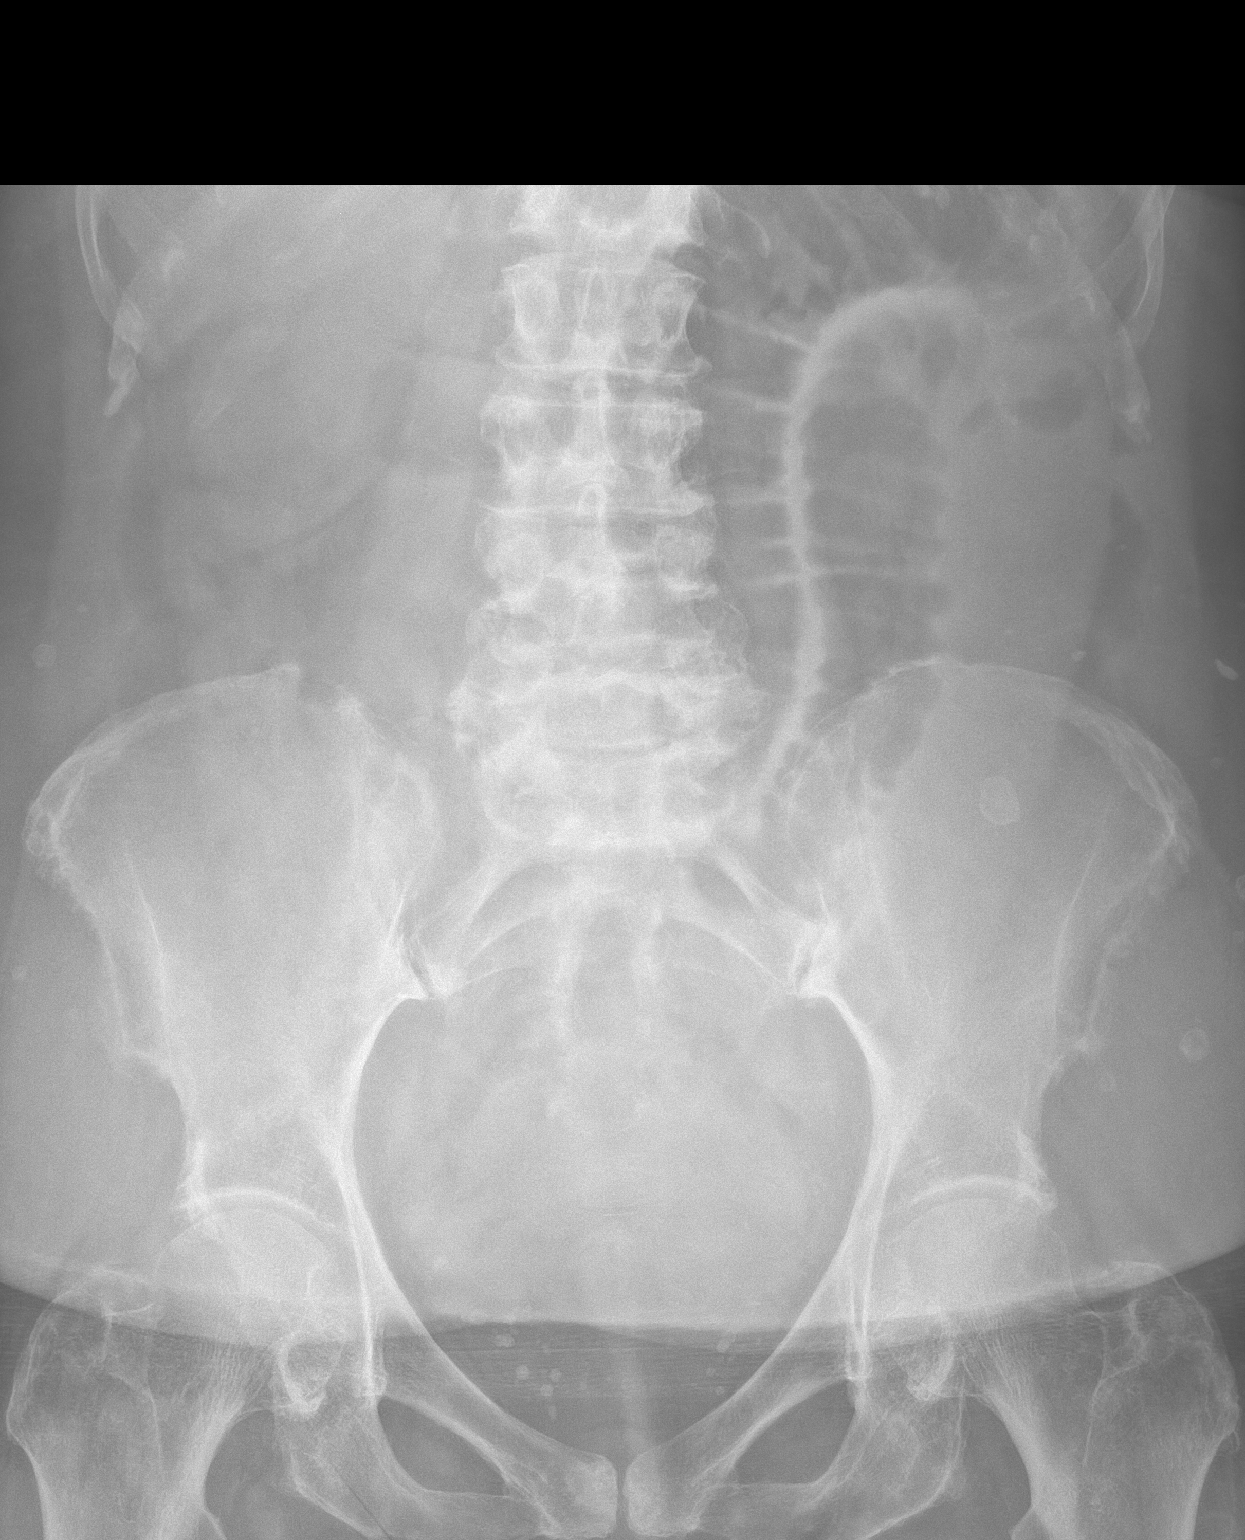

[3 of 3 positions shown; findings below may reference images not displayed]

FINDINGS: Supine and upright frontal views of the abdomen and pelvis as well
as an upright frontal view of the chest are obtained. Cardiac
silhouette is stable. No airspace disease, effusion, or
pneumothorax.

There is a dilated loop of distended gas-filled small bowel within
the left upper quadrant, measuring up to 5.2 cm in diameter. Gas
fluid level seen within the left upper quadrant. There is a paucity
of distal small bowel gas and colonic gas. No free gas in the
greater peritoneal sac. No masses or abnormal calcifications.
IMPRESSION: 1. Findings compatible with high-grade small bowel obstruction.
2. No acute intrathoracic process.

## 2020-05-17 IMAGING — CT CT ABD-PELV W/ CM
2 of 5 series · 16 of 46 positions shown, 18 images · IV contrast (APPLIED)
Comparison: Radiographs earlier today.  CT [DATE]

CLINICAL DATA: Abdominal pain. Vomiting. Possible bowel obstruction
on radiograph.

EXAM:
CT ABDOMEN AND PELVIS WITH CONTRAST
TECHNIQUE: Multidetector CT imaging of the abdomen and pelvis was performed
using the standard protocol following bolus administration of
intravenous contrast.
CONTRAST:  100mL OMNIPAQUE IOHEXOL 300 MG/ML  SOLN

[Series 4: abd/ pelvis 5.0 i30f 2 · axial · 0.98mm/px · z∈[+798,+1213]mm · 13 of 95 slices shown, 15 images]
[im 6/95  soft-tissue]
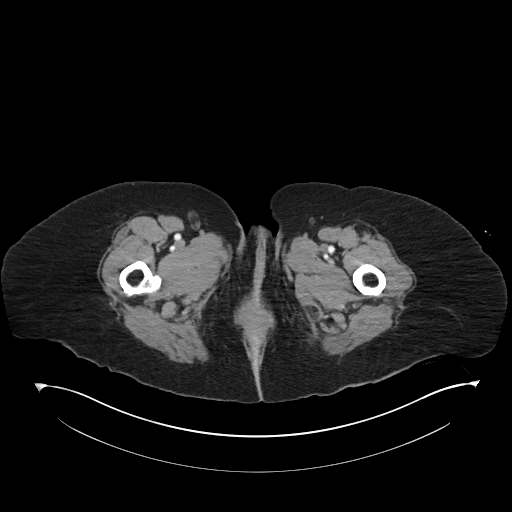
[im 6/95  bone]
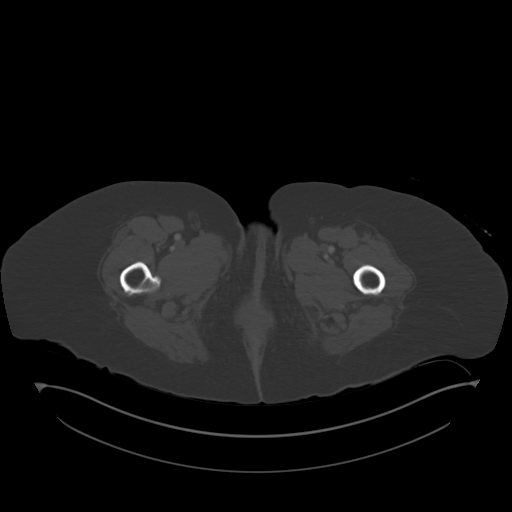
[im 11/95  soft-tissue]
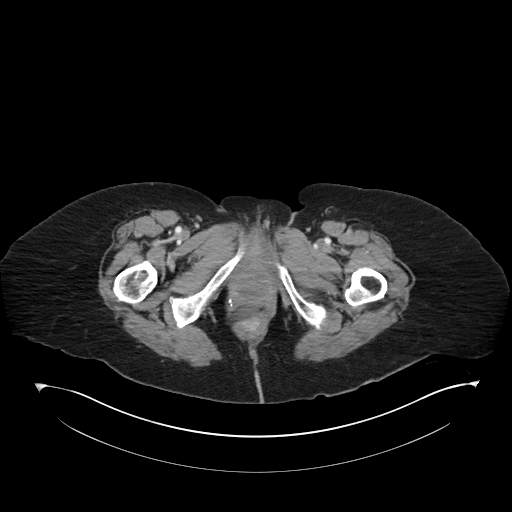
[im 21/95  soft-tissue]
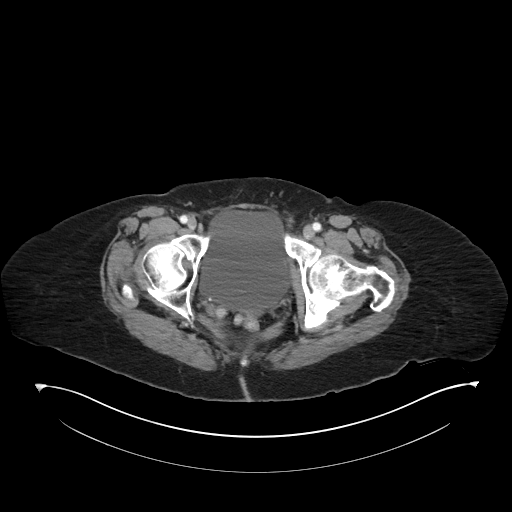
[im 27/95  soft-tissue]
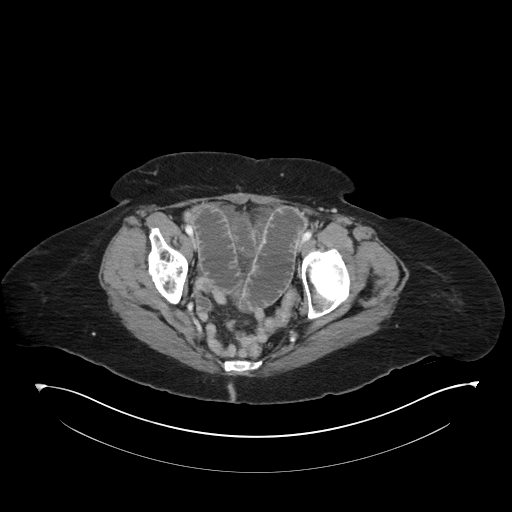
[im 32/95  soft-tissue]
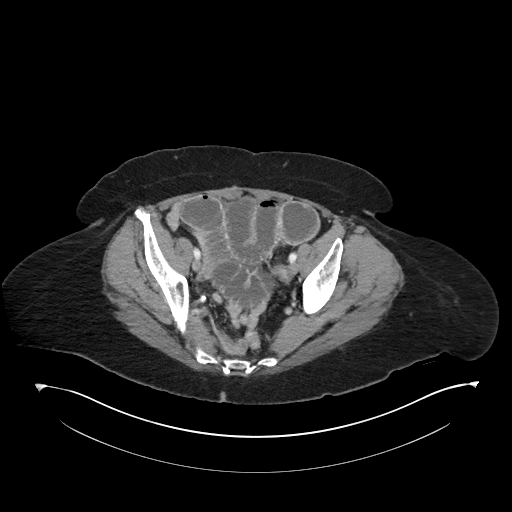
[im 42/95  soft-tissue]
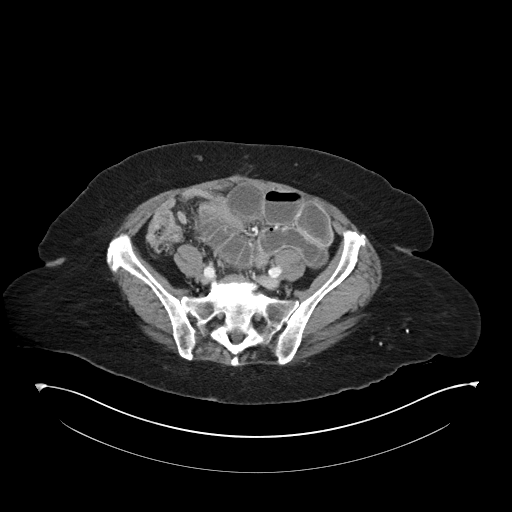
[im 48/95  soft-tissue]
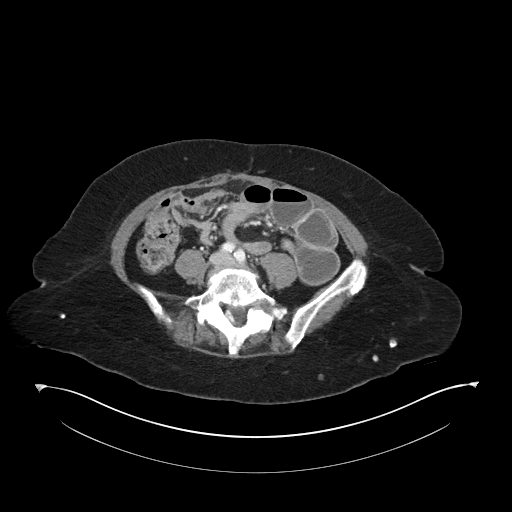
[im 53/95  soft-tissue]
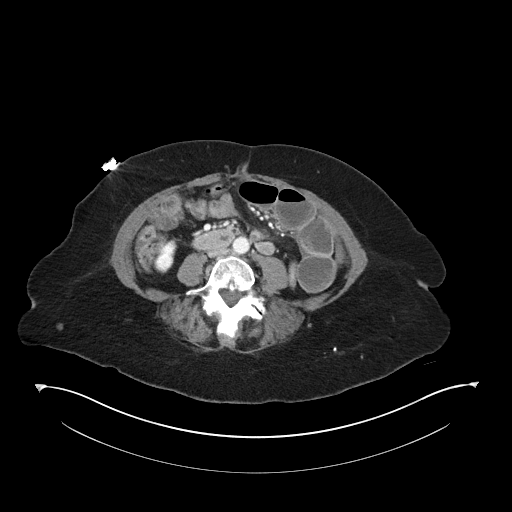
[im 63/95  soft-tissue]
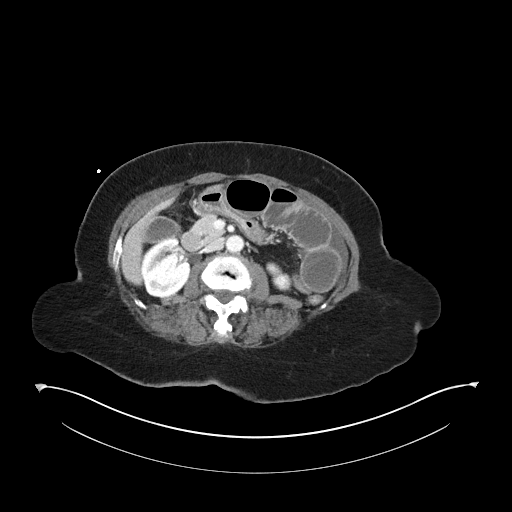
[im 63/95  bone]
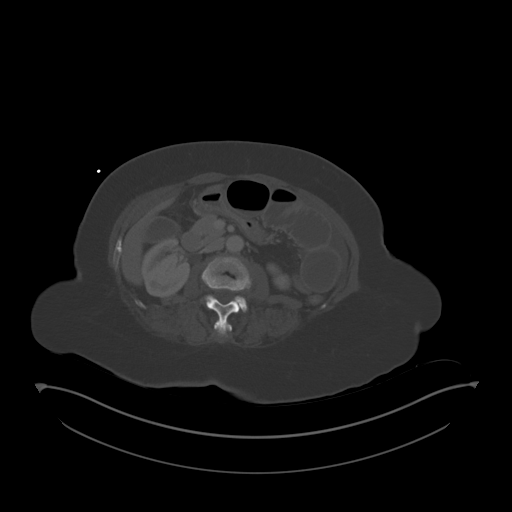
[im 68/95  soft-tissue]
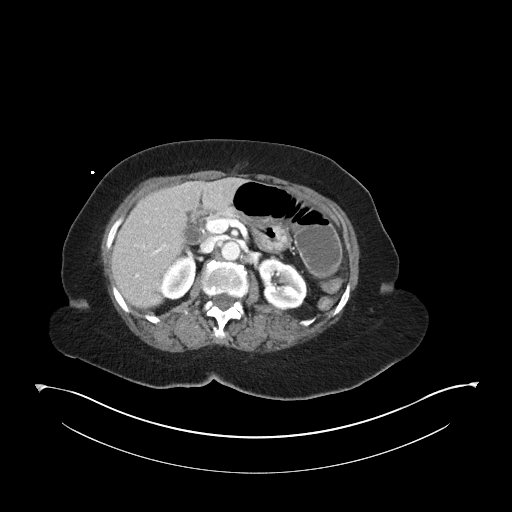
[im 74/95  soft-tissue]
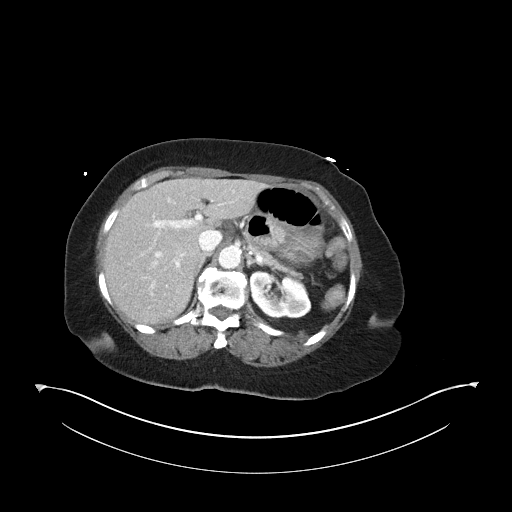
[im 84/95  soft-tissue]
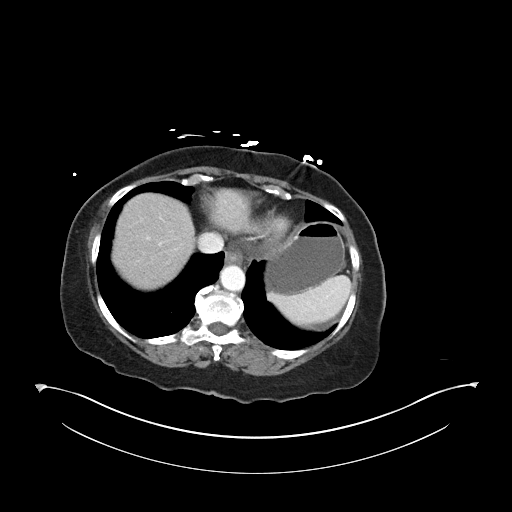
[im 89/95  soft-tissue]
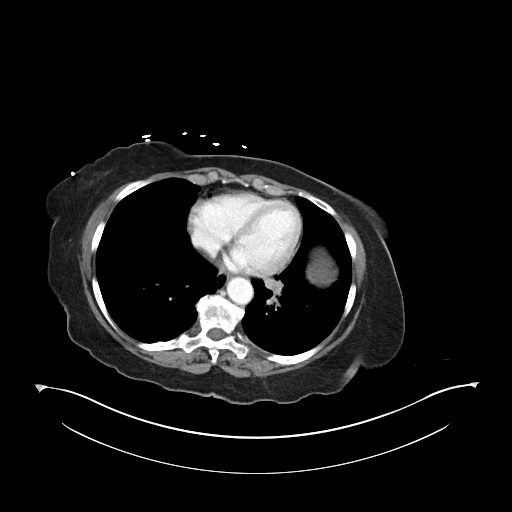

[Series 7: coronal soft tissue · coronal · 0.95mm/px · 3 of 93 slices shown]
[im 31/93  soft-tissue]
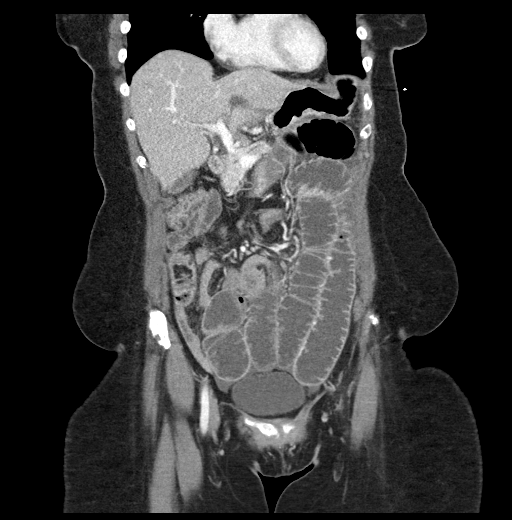
[im 41/93  soft-tissue]
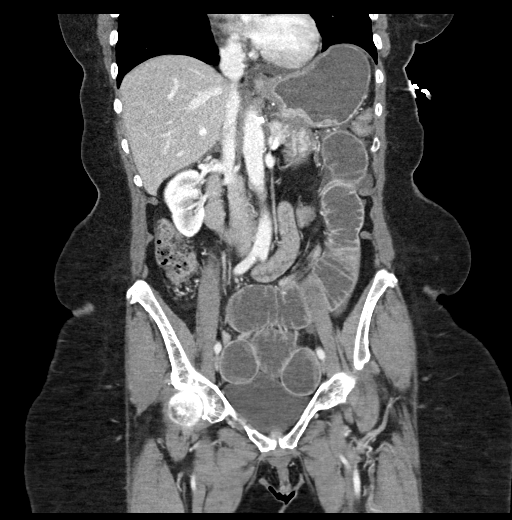
[im 52/93  soft-tissue]
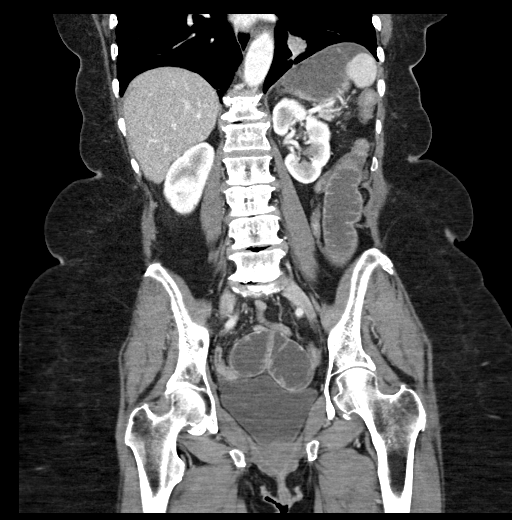

[16 of 46 positions shown; findings below may reference images not displayed]

FINDINGS: Lower chest: Lobulated 2.5 cm left lower lobe pulmonary nodule with
irregular margins, increased in size from prior exam. No pleural
fluid or acute airspace disease.

Hepatobiliary: Mild hepatic steatosis. No focal hepatic lesion.
Gallbladder physiologically distended, no calcified stone. No
biliary dilatation.

Pancreas: Parenchymal atrophy. No ductal dilatation or inflammation.

Spleen: Normal in size without focal abnormality.

Adrenals/Urinary Tract: Normal adrenal glands. No hydronephrosis or
perinephric edema. Homogeneous renal enhancement with symmetric
excretion on delayed phase imaging. Tiny hypodensity in the upper
left kidney is too small to accurately characterize. Urinary bladder
is physiologically distended without wall thickening. Slight
cystocele.

Stomach/Bowel: Fluid-filled distended gastric cardia. Dilated
fluid-filled proximal small bowel. Transition from dilated to
nondilated in the mid central abdomen, series 4, image 51. More
distal small bowel is decompressed. Mild mesenteric edema and small
free fluid. No pneumatosis. The appendix is not confidently
visualized on the current exam. Majority of the colon is
decompressed. Occasional descending and sigmoid diverticula without
diverticulitis. There is pelvic floor descent with small-bowel loops
coursing posterior to the bladder.

Vascular/Lymphatic: No acute vascular findings are evidence of
vascular compromise. Aortic atherosclerosis. Patent portal vein.
Patent mesenteric vessels. No enlarged lymph nodes in the abdomen or
pelvis.

Reproductive: Status post hysterectomy. No adnexal masses.

Other: Small volume of non organized free fluid in both upper
quadrants, scattered throughout the mesentery and in the pelvis. No
free air or perforation. No focal fluid collection/abscess. Small
fat containing upper ventral abdominal wall hernia.

Musculoskeletal: Diffuse lumbar degenerative change. No acute
osseous abnormality or focal osseous lesion. Scattered subcutaneous
granuloma in both gluteal regions/flanks.
IMPRESSION: 1. Small bowel obstruction with transition point in the mid central
abdomen, likely due to adhesions. Small volume of non organized free
fluid in the abdomen and pelvis. No perforation or evidence of
ischemia.
2. Lobulated 2.5 cm left lower lobe pulmonary nodule with irregular
margins, increased in size from prior exam. This is suspicious for
primary bronchogenic malignancy. Recommend dedicated chest CT (with
IV contrast) for complete evaluation of the thorax.
3. Mild hepatic steatosis.
4. Pelvic floor descent with small-bowel loops coursing posterior to
the bladder.

Aortic Atherosclerosis ([1D]-[1D]).

## 2020-05-17 IMAGING — DX DG ABD PORTABLE 1V
1 series · 1 of 1 positions shown · non-contrast
Comparison: [DATE]

CLINICAL DATA: Nasogastric tube placement

EXAM:
PORTABLE ABDOMEN - 1 VIEW

[abdomen kub]
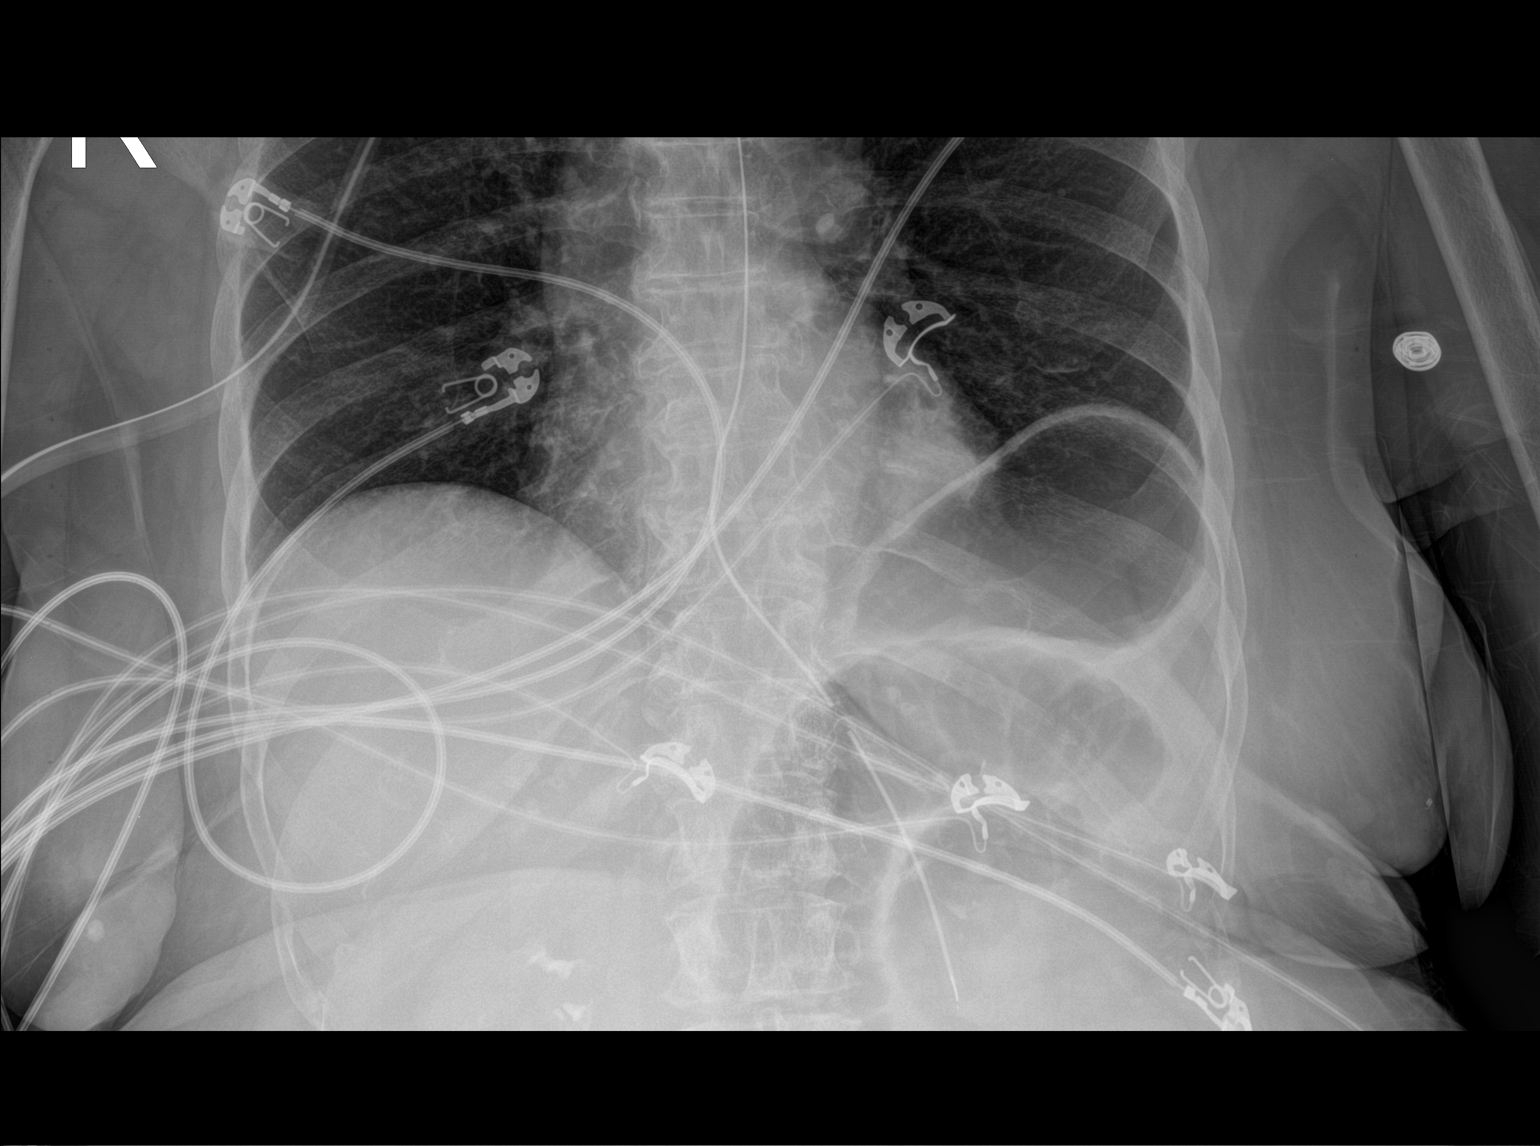

[1 of 1 positions shown; findings below may reference images not displayed]

FINDINGS: Nasogastric tube extends into the upper abdomen with its tip
overlying the expected proximal to mid body of the stomach.
Visualized lung bases are clear. Dilated loops of gas-filled small
bowel are seen within the left upper quadrant suggestive of
underlying small-bowel obstruction, however, the mid and inferior
abdomen are excluded from view. No gross free intraperitoneal gas
within the visualized abdomen.
IMPRESSION: Nasogastric tube extends into the proximal to mid body of the
stomach.

## 2020-05-17 MED ORDER — KCL IN DEXTROSE-NACL 10-5-0.45 MEQ/L-%-% IV SOLN
INTRAVENOUS | Status: DC
  Filled 2020-05-17 (×2): qty 1000

## 2020-05-17 MED ORDER — ENOXAPARIN SODIUM 40 MG/0.4ML ~~LOC~~ SOLN
40.0000 mg | SUBCUTANEOUS | Status: DC
  Administered 2020-05-17 – 2020-05-25 (×9): 40 mg via SUBCUTANEOUS
  Filled 2020-05-17 (×9): qty 0.4

## 2020-05-17 MED ORDER — IOHEXOL 300 MG/ML  SOLN
100.0000 mL | Freq: Once | INTRAMUSCULAR | Status: AC | PRN
  Administered 2020-05-17: 100 mL via INTRAVENOUS

## 2020-05-17 MED ORDER — MORPHINE SULFATE (PF) 2 MG/ML IV SOLN
2.0000 mg | Freq: Once | INTRAVENOUS | Status: AC
  Administered 2020-05-17: 2 mg via INTRAVENOUS
  Filled 2020-05-17: qty 1

## 2020-05-17 MED ORDER — ONDANSETRON HCL 4 MG/2ML IJ SOLN
4.0000 mg | Freq: Once | INTRAMUSCULAR | Status: DC
  Administered 2020-05-17: 4 mg via INTRAVENOUS
  Filled 2020-05-17: qty 2

## 2020-05-17 MED ORDER — ONDANSETRON HCL 4 MG/2ML IJ SOLN
4.0000 mg | Freq: Once | INTRAMUSCULAR | Status: AC | PRN
  Administered 2020-05-17: 4 mg via INTRAVENOUS
  Filled 2020-05-17: qty 2

## 2020-05-17 MED ORDER — ONDANSETRON HCL 4 MG PO TABS: 4.0000 mg | ORAL_TABLET | Freq: Four times a day (QID) | ORAL | Status: DC | PRN

## 2020-05-17 MED ORDER — ONDANSETRON HCL 4 MG/2ML IJ SOLN
4.0000 mg | Freq: Four times a day (QID) | INTRAMUSCULAR | Status: DC | PRN
  Administered 2020-05-19 – 2020-05-31 (×16): 4 mg via INTRAVENOUS
  Filled 2020-05-17 (×16): qty 2

## 2020-05-17 MED ORDER — ACETAMINOPHEN 650 MG RE SUPP: 650.0000 mg | Freq: Four times a day (QID) | RECTAL | Status: DC | PRN

## 2020-05-17 MED ORDER — MORPHINE SULFATE (PF) 2 MG/ML IV SOLN
1.0000 mg | INTRAVENOUS | Status: DC | PRN
  Administered 2020-05-18: 3 mg via INTRAVENOUS
  Administered 2020-05-19 – 2020-05-21 (×9): 2 mg via INTRAVENOUS
  Filled 2020-05-17 (×2): qty 1
  Filled 2020-05-17: qty 2
  Filled 2020-05-17 (×7): qty 1

## 2020-05-17 MED ORDER — ACETAMINOPHEN 325 MG PO TABS: 650.0000 mg | ORAL_TABLET | Freq: Four times a day (QID) | ORAL | Status: DC | PRN

## 2020-05-17 MED ORDER — LABETALOL HCL 5 MG/ML IV SOLN: 10.0000 mg | INTRAVENOUS | Status: DC | PRN

## 2020-05-17 MED ORDER — IOHEXOL 300 MG/ML  SOLN
75.0000 mL | Freq: Once | INTRAMUSCULAR | Status: AC | PRN
  Administered 2020-05-17: 75 mL via INTRAVENOUS

## 2020-05-17 MED ORDER — ALBUTEROL SULFATE HFA 108 (90 BASE) MCG/ACT IN AERS
2.0000 | INHALATION_SPRAY | RESPIRATORY_TRACT | Status: DC | PRN
  Filled 2020-05-17: qty 6.7

## 2020-05-17 NOTE — ED Provider Notes (Signed)
[facility]ERGENCY DEPARTMENT Provider Note   CSN: [id number] Arrival date & time: 05/17/20  1734     History Chief Complaint  Patient presents with  . Abdominal Pain    Angelica [name] is a 79 y.o. female.  HPI 79 year-old female with a history of small bowel obstruction requiring surgery, asthma, GERD, hypertension, thyroid disease, iron deficiency anemia presents to the ER with complaints of abdominal pain.  States she has had abdominal pain for several days, however has gotten significantly worse today.  [facility] and had x-ray done which showed a high-grade small bowel obstruction.  Patient has noted nausea and vomiting which started today.   Abdominal pain is diffuse.  Patient states that she has had surgery to repair a prior small bowel obstruction.  No fevers or chills.  She states that she has been taking medicine for her abdomen, which she thinks is stool softeners.  Has a history of chronic constipation but did have a normal bowel movement today.  Past Medical History:  Diagnosis Date  . Arthritis   . Asthma   . Bowel obstruction ([place])   . Environmental allergies   . GERD (gastroesophageal reflux disease)   . Glaucoma 05/09/2017  . H. pylori infection   . HLD (hyperlipidemia) 01/16/2019  . Hypertension   . Iron deficiency anemia   . Osteopenia 05/26/2017  . Thyroid disease   . Urticaria 07/25/2018    Patient Active Problem List   Diagnosis Date Noted  . Epigastric abdominal tenderness with rebound tenderness 05/17/2020  . Lung nodule 05/17/2020  . Iron deficiency 05/03/2020  . Dizziness 01/19/2020  . Left rotator cuff tear 11/12/2019  . Trigger thumb of right hand 11/12/2019  . Trigger finger, left ring finger 11/12/2019  . Left shoulder pain 09/29/2019  . Swelling of lower extremity 07/30/2019  . Greater trochanteric bursitis of left hip 04/07/2019  . Right ankle swelling 02/18/2019  . Ganglion cyst of right foot  01/16/2019  . HLD (hyperlipidemia) 01/16/2019  . 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 Urticaria 07/25/2018  . Low back pain 07/09/2018  . Hyperglycemia 07/09/2018  . Rash 10/10/2017  . Constipation 08/19/2017  . Hypokalemia 07/09/2017  . Asthma, severe persistent, well-controlled 06/04/2017  . 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 Nasal polyposis 06/04/2017  . Leukocytosis 06/04/2017  . Osteopenia 05/26/2017  . Encounter for well adult exam with abnormal findings 05/09/2017  . Glaucoma 05/09/2017  . GERD (gastroesophageal reflux disease)   . H. pylori infection   . Other allergic rhinitis 03/20/2016  . Cough, persistent 03/20/2016  . Hypoglycemia 04/17/2015  . Malnutrition of moderate degree 04/11/2015  . SBO (small bowel obstruction) ([place]) 04/10/2015  . HTN (hypertension) 04/10/2015    Past Surgical History:  Procedure Laterality Date  . ABDOMINAL HYSTERECTOMY    . BREAST BIOPSY    . COLON SURGERY    . KNEE SURGERY    . LAPAROTOMY N/A 04/18/2015   Procedure: Exploratory laparotomy with lysis of adhesions, possible bowel resection;  Surgeon: Ralene Ok, MD;  Location: [facility];  Service: General;  Laterality: N/A;  . NASAL SINUS SURGERY    . THYROID SURGERY       OB History   No obstetric history on file.     Family History  Problem Relation Age of Onset  . Diabetes Mother   . Hypertension Mother   . Hypertension Father   . Glaucoma Sister   . Glaucoma Brother   .  Allergic rhinitis Neg Hx   . Angioedema Neg Hx   . Asthma Neg Hx   . Eczema Neg Hx   . Immunodeficiency Neg Hx   . Urticaria Neg Hx     Social History   Tobacco Use  . Smoking status: Former Smoker    Types: Cigarettes  . Smokeless tobacco: Never Used  Vaping Use  . Vaping Use: Never used  Substance Use Topics  . Alcohol use: No  . Drug use: No    Home Medications Prior to Admission medications   Medication Sig Start Date End Date Taking? Authorizing Provider   acetaminophen (TYLENOL) 500 MG tablet Take 500 mg by mouth daily as needed for mild pain, moderate pain, fever or headache. For pain   Yes [provider]  amLODipine (NORVASC) 10 MG tablet Take 1 tablet by mouth once daily Patient taking differently: Take 10 mg by mouth daily. 04/28/20  Yes [name], MD  Benralizumab [facility] PEN) 30 MG/ML SOAJ Inject into the skin every 8 (eight) weeks.   Yes [provider]  budesonide-formoterol (SYMBICORT) 80-4.5 MCG/ACT inhaler Inhale 2 puffs into the lungs 2 (two) times daily. Use for asthma flares 2 puffs twice a day for 2 weeks or until cough and wheeze free Patient taking differently: Inhale 2 puffs into the lungs 2 (two) times daily as needed (for asthma flares, for 2 weeks, or until cough and wheeze-free). 01/28/20  Yes Padgett, Rae Halsted, MD  Cholecalciferol (VITAMIN D3) 50 MCG (2000 UT) TABS Take 2,000 microcuries/1.30m2 by mouth daily.   Yes [provider]  EPINEPHrine (EPIPEN 2-PAK) 0.3 mg/0.3 mL IJ SOAJ injection Use as directed for severe allergic reaction 01/28/20  Yes Padgett, Rae Halsted, MD  ipratropium (ATROVENT) 0.06 % nasal spray Place 2 sprays into both nostrils 3 (three) times daily. 11/12/19  Yes Padgett, Rae Halsted, MD  LINZESS 72 MCG capsule TAKE 1 CAPSULE BY MOUTH ONCE DAILY AS NEEDED Patient taking differently: Take 72 mcg by mouth daily as needed (for constipation). 04/22/20  Yes [name], MD  loratadine (CLARITIN) 10 MG tablet Take 10 mg by mouth daily.   Yes [provider]  losartan (COZAAR) 100 MG tablet Take 1 tablet (100 mg total) by mouth daily. 08/12/19  Yes [name], MD  Magnesium Hydroxide (PHILLIPS MILK OF MAGNESIA PO) Take 15-30 mLs by mouth daily as needed (for constipation).   Yes [provider]  montelukast (SINGULAIR) 10 MG tablet Take 1 tablet (10 mg total) by mouth at bedtime. Patient taking differently: Take 10 mg by mouth at bedtime as needed  (for flares). 07/09/18  Yes [name], MD  omeprazole (PRILOSEC) 40 MG capsule TAKE 1 CAPSULE DAILY FOR ACID REFLUX Patient taking differently: Take 40 mg by mouth daily as needed (for heartburn). 12/30/19  Yes [name], MD  polyethylene glycol powder (GLYCOLAX/MIRALAX) powder Take 17 g by mouth daily as needed for mild constipation (MIX AND DRINK).   Yes [provider]  pravastatin (PRAVACHOL) 40 MG tablet Take 40 mg by mouth daily.   Yes [provider]  vitamin B-12 (CYANOCOBALAMIN) 1000 MCG tablet Take 1,000 mcg by mouth daily.   Yes [provider]  iron polysaccharides (FERREX 150) 150 MG capsule Take 1 capsule (150 mg total) by mouth daily. 05/11/20   [name], MD  pravastatin (PRAVACHOL) 80 MG tablet Take 1 tablet (80 mg total) by mouth daily. 05/11/20   [name], MD  traMADol (ULTRAM) 50 MG tablet Take 1 tablet (50 mg total) by mouth every 6 (six) hours as needed. Patient not taking: No sig reported 08/12/19   [name], MD    Allergies    Asa buff (mag [buffered aspirin], Fish allergy, Other, Peanut-containing drug products, Penicillins, Shellfish-derived products, and Strawberry extract  Review of Systems   Review of Systems  Constitutional: Negative for chills and fever.  HENT: Negative for ear pain and sore throat.   Eyes: Negative for pain and visual disturbance.  Respiratory: Negative for cough and shortness of breath.   Cardiovascular: Negative for chest pain and palpitations.  Gastrointestinal: Positive for abdominal pain, nausea and vomiting.  Genitourinary: Negative for dysuria and hematuria.  Musculoskeletal: Negative for arthralgias and back pain.  Skin: Negative for color change and rash.  Neurological: Negative for seizures and syncope.  All other systems reviewed and are negative.   Physical Exam Updated Vital Signs BP 133/86   Pulse 76   Temp (!) 97.5 F (36.4 C) (Oral)   Resp 20   SpO2 100%   Physical  Exam Vitals and nursing note reviewed.  Constitutional:      Appearance: She is well-developed and well-nourished. She is ill-appearing.     Comments: Uncomfortable appearing, moaning  HENT:     Head: Normocephalic and atraumatic.  Eyes:     Conjunctiva/sclera: Conjunctivae normal.  Cardiovascular:     Rate and Rhythm: Normal rate and regular rhythm.     Heart sounds: No murmur heard.   Pulmonary:     Effort: Pulmonary effort is normal. No respiratory distress.     Breath sounds: Normal breath sounds.  Abdominal:     General: Bowel sounds are increased. There is distension.     Palpations: Abdomen is soft.     Tenderness: There is generalized abdominal tenderness. There is no right CVA tenderness or left CVA tenderness.     Hernia: No hernia is present.  Musculoskeletal:        General: No edema.     Cervical back: Neck supple.  Skin:    General: Skin is warm and dry.     Findings: No rash.  Neurological:     General: No focal deficit present.     Mental Status: She is alert.  Psychiatric:        Mood and Affect: Mood and affect normal.     ED Results / Procedures / Treatments   Labs (all labs ordered are listed, but only abnormal results are displayed) Labs Reviewed  COMPREHENSIVE METABOLIC PANEL - Abnormal; Notable for the following components:      Result Value   Glucose, Bld 141 (*)    All other components within normal limits  CBC - Abnormal; Notable for the following components:   Hemoglobin 15.1 (*)    All other components within normal limits  SARS CORONAVIRUS 2 (TAT 6-24 HRS)  LIPASE, BLOOD  BASIC METABOLIC PANEL  CBC    EKG None  Radiology CT ABDOMEN PELVIS W CONTRAST  Result Date: 05/17/2020 CLINICAL DATA:  Abdominal pain. Vomiting. Possible bowel obstruction on radiograph. EXAM: CT ABDOMEN AND PELVIS WITH CONTRAST TECHNIQUE: Multidetector CT imaging of the abdomen and pelvis was performed using the standard protocol following bolus  administration of intravenous contrast. CONTRAST:  18mL OMNIPAQUE IOHEXOL 300 MG/ML  SOLN COMPARISON:  Radiographs earlier today.  CT 02/04/2017 FINDINGS: Lower chest: Lobulated 2.5 cm left lower lobe pulmonary nodule with irregular margins, increased in size from prior  exam. No pleural fluid or acute airspace disease. Hepatobiliary: Mild hepatic steatosis. No focal hepatic lesion. Gallbladder physiologically distended, no calcified stone. No biliary dilatation. Pancreas: Parenchymal atrophy. No ductal dilatation or inflammation. Spleen: Normal in size without focal abnormality. Adrenals/Urinary Tract: Normal adrenal glands. No hydronephrosis or perinephric edema. Homogeneous renal enhancement with symmetric excretion on delayed phase imaging. Tiny hypodensity in the upper left kidney is too small to accurately characterize. Urinary bladder is physiologically distended without wall thickening. Slight cystocele. Stomach/Bowel: Fluid-filled distended gastric cardia. Dilated fluid-filled proximal small bowel. Transition from dilated to nondilated in the mid central abdomen, series 4, image 51. More distal small bowel is decompressed. Mild mesenteric edema and small free fluid. No pneumatosis. The appendix is not confidently visualized on the current exam. Majority of the colon is decompressed. Occasional descending and sigmoid diverticula without diverticulitis. There is pelvic floor descent with small-bowel loops coursing posterior to the bladder. Vascular/Lymphatic: No acute vascular findings are evidence of vascular compromise. Aortic atherosclerosis. Patent portal vein. Patent mesenteric vessels. No enlarged lymph nodes in the abdomen or pelvis. Reproductive: Status post hysterectomy. No adnexal masses. Other: Small volume of non organized free fluid in both upper quadrants, scattered throughout the mesentery and in the pelvis. No free air or perforation. No focal fluid collection/abscess. Small fat containing  upper ventral abdominal wall hernia. Musculoskeletal: Diffuse lumbar degenerative change. No acute osseous abnormality or focal osseous lesion. Scattered subcutaneous granuloma in both gluteal regions/flanks. IMPRESSION: 1. Small bowel obstruction with transition point in the mid central abdomen, likely due to adhesions. Small volume of non organized free fluid in the abdomen and pelvis. No perforation or evidence of ischemia. 2. Lobulated 2.5 cm left lower lobe pulmonary nodule with irregular margins, increased in size from prior exam. This is suspicious for primary bronchogenic malignancy. Recommend dedicated chest CT (with IV contrast) for complete evaluation of the thorax. 3. Mild hepatic steatosis. 4. Pelvic floor descent with small-bowel loops coursing posterior to the bladder. Aortic Atherosclerosis (ICD10-I70.0). Electronically Signed   By: Keith Rake M.D.   On: 05/17/2020 19:40   DG ABD ACUTE 2+V W 1V CHEST  Result Date: 05/17/2020 CLINICAL DATA:  Chronic upper abdominal pain EXAM: DG ABDOMEN ACUTE WITH 1 VIEW CHEST COMPARISON:  08/15/2017 FINDINGS: Supine and upright frontal views of the abdomen and pelvis as well as an upright frontal view of the chest are obtained. Cardiac silhouette is stable. No airspace disease, effusion, or pneumothorax. There is a dilated loop of distended gas-filled small bowel within the left upper quadrant, measuring up to 5.2 cm in diameter. Gas fluid level seen within the left upper quadrant. There is a paucity of distal small bowel gas and colonic gas. No free gas in the greater peritoneal sac. No masses or abnormal calcifications. IMPRESSION: 1. Findings compatible with high-grade small bowel obstruction. 2. No acute intrathoracic process. Electronically Signed   By: Randa Ngo M.D.   On: 05/17/2020 16:24    Procedures Procedures (including critical care time)  Medications Ordered in ED Medications  enoxaparin (LOVENOX) injection 40 mg (has no  administration in time range)  dextrose 5 % and 0.45 % NaCl with KCl 10 mEq/L infusion (has no administration in time range)  acetaminophen (TYLENOL) tablet 650 mg (has no administration in time range)    Or  acetaminophen (TYLENOL) suppository 650 mg (has no administration in time range)  ondansetron (ZOFRAN) tablet 4 mg (has no administration in time range)    Or  ondansetron (ZOFRAN) injection 4  mg (has no administration in time range)  morphine 2 MG/ML injection 1-3 mg (has no administration in time range)  albuterol (VENTOLIN HFA) 108 (90 Base) MCG/ACT inhaler 2 puff (has no administration in time range)  labetalol (NORMODYNE) injection 10 mg (has no administration in time range)  ondansetron (ZOFRAN) injection 4 mg (4 mg Intravenous Given 05/17/20 1805)  morphine 2 MG/ML injection 2 mg (2 mg Intravenous Given 05/17/20 1845)  iohexol (OMNIPAQUE) 300 MG/ML solution 100 mL (100 mLs Intravenous Contrast Given 05/17/20 1926)    ED Course  I have reviewed the triage vital signs and the nursing notes.  Pertinent labs & imaging results that were available during my care of the patient were reviewed by me and considered in my medical decision making (see chart for details).    MDM Rules/Calculators/A&P                          79 year old female presents to the ER with abdominal pain, positive plain films done in the outpatient office with a lower GI which showed high-grade bowel small bowel obstruction On arrival, she is uncomfortable appearing, moaning in pain, however alert and oriented.  Vitals on arrival overall reassuring.  Physical exam with generalized abdominal tenderness, high-pitched tinkling abdominal sounds.  Her CBC and CMP are largely unremarkable.    Within 15 minutes of the patient's arrival, general surgery was consulted.  Dr. Brantley Stage recommended admitting her to medicine and they will consult.  CT of the abdomen with contrast was ordered.  CT of the abdomen confirming  small bowel obstruction with a transition point in the mid central abdomen, likely due to adhesions.  They did note some normal denies free fluid in the abdomen or pelvis but no perforation.  There was also a lobulated 2.5 cm left lower pulmonary nodule with irregular margins which is increased from prior exam suspicious for primary bronchogenic malignancy.  Patient has no shortness of breath or chest pain at this time.  NG tube insertion was ordered.  Consulted hospitalist team for admission  Spoke with Dr. Myna Hidalgo with the hospitalist team who will admit the patient for further evaluation and treatment.  General surgery following.  This was a shared visit with my supervising physician Dr. Sherry Ruffing who independently saw and evaluated the patient & provided guidance in evaluation/management/disposition ,in agreement with care   Final Clinical Impression(s) / ED Diagnoses Final diagnoses:  Small bowel obstruction [facility])  Pulmonary nodule    Rx / DC Orders ED Discharge Orders    None       Lyndel Safe 05/17/20 2027    Tegeler, Gwenyth Allegra, MD 05/19/20 0000

## 2020-05-17 NOTE — Progress Notes (Signed)
Subjective:  Patient ID: Angelica [name], female    DOB: [birth year]  Age: 79 y.o. MRN: [id number]  CC: Abdominal Pain  This visit occurred during the SARS-CoV-2 public health emergency.  Safety protocols were in place, including screening questions prior to the visit, additional usage of staff PPE, and extensive cleaning of exam room while observing appropriate contact time as indicated for disinfecting solutions.   NEW TO ME  HPI Angelica [name] presents for a several day history of epigastric abdominal pain with nausea and vomiting.  She has a history of small bowel obstruction.  She suffers from chronic constipation but had a normal bowel movement earlier today.  Outpatient Medications Prior to Visit  Medication Sig Dispense Refill  . acetaminophen (TYLENOL) 500 MG tablet Take 500 mg by mouth daily as needed for mild pain, moderate pain, fever or headache. For pain    . albuterol (PROVENTIL) (2.5 MG/3ML) 0.083% nebulizer solution Take 2.5 mg by nebulization 2 (two) times daily as needed for wheezing or shortness of breath.    . AMBULATORY NON FORMULARY MEDICATION Allergy inj Subcutaneous every 8 weeks    . amLODipine (NORVASC) 10 MG tablet Take 1 tablet by mouth once daily 90 tablet 0  . budesonide-formoterol (SYMBICORT) 80-4.5 MCG/ACT inhaler Inhale 2 puffs into the lungs 2 (two) times daily. Use for asthma flares 2 puffs twice a day for 2 weeks or until cough and wheeze free 10.2 g 5  . cholecalciferol (VITAMIN D) 1000 units tablet Take 2,000 Units by mouth daily.    Marland Kitchen EPINEPHrine (EPIPEN 2-PAK) 0.3 mg/0.3 mL IJ SOAJ injection Use as directed for severe allergic reaction 2 each 1  . ipratropium (ATROVENT) 0.06 % nasal spray Place 2 sprays into both nostrils 3 (three) times daily. 15 mL 3  . iron polysaccharides (FERREX 150) 150 MG capsule Take 1 capsule (150 mg total) by mouth daily. 90 capsule 1  . LINZESS 72 MCG capsule TAKE 1 CAPSULE BY MOUTH ONCE DAILY AS NEEDED 30 capsule 0  .  loratadine (CLARITIN) 10 MG tablet Take 10 mg by mouth daily.    Marland Kitchen losartan (COZAAR) 100 MG tablet Take 1 tablet (100 mg total) by mouth daily. 90 tablet 3  . Magnesium Hydroxide (PHILLIPS MILK OF MAGNESIA PO) Take 1 Dose by mouth as needed.    . montelukast (SINGULAIR) 10 MG tablet Take 1 tablet (10 mg total) by mouth at bedtime. 90 tablet 3  . omeprazole (PRILOSEC) 40 MG capsule TAKE 1 CAPSULE DAILY FOR ACID REFLUX 90 capsule 3  . polyethylene glycol powder (GLYCOLAX/MIRALAX) powder Take 17 g by mouth daily.    . pravastatin (PRAVACHOL) 80 MG tablet Take 1 tablet (80 mg total) by mouth daily. 90 tablet 3  . traMADol (ULTRAM) 50 MG tablet Take 1 tablet (50 mg total) by mouth every 6 (six) hours as needed. 120 tablet 2  . vitamin B-12 (CYANOCOBALAMIN) 1000 MCG tablet Take 1,000 mcg by mouth daily.     Facility-Administered Medications Prior to Visit  Medication Dose Route Frequency Provider Last Rate Last Admin  . Benralizumab SOSY 30 mg  30 mg Subcutaneous Q8 Weeks Kennith Gain, MD   30 mg at 05/03/20 0947    ROS Review of Systems  Constitutional: Negative.  Negative for chills, diaphoresis, fatigue and fever.  HENT: Negative.   Eyes: Negative for visual disturbance.  Respiratory: Negative for cough, chest tightness, shortness of breath and wheezing.   Cardiovascular: Negative for chest pain, palpitations and  leg swelling.  Gastrointestinal: Positive for abdominal pain, constipation and nausea. Negative for diarrhea.  Endocrine: Negative.   Genitourinary: Negative.  Negative for difficulty urinating, dysuria and hematuria.  Musculoskeletal: Negative.   Skin: Negative.   Allergic/Immunologic: Negative.   Neurological: Negative.   Hematological: Negative for adenopathy. Does not bruise/bleed easily.  Psychiatric/Behavioral: Negative.     Objective:  BP 140/82 (BP Location: Left Arm, Patient Position: Sitting, Cuff Size: Large)   Pulse 84   Temp 97.7 F (36.5 C) (Oral)    Ht 5\' 5"  (1.651 m)   Wt 180 lb (81.6 kg)   SpO2 94%   BMI 29.95 kg/m   BP Readings from Last 3 Encounters:  05/17/20 140/82  05/03/20 (!) 150/86  01/28/20 (!) 142/60    Wt Readings from Last 3 Encounters:  05/17/20 180 lb (81.6 kg)  05/03/20 182 lb (82.6 kg)  01/19/20 178 lb 9.6 oz (81 kg)    Physical Exam Vitals reviewed.  Constitutional:      Appearance: She is ill-appearing.  HENT:     Nose: Nose normal.     Mouth/Throat:     Mouth: Mucous membranes are moist.  Eyes:     General: No scleral icterus.    Conjunctiva/sclera: Conjunctivae normal.  Cardiovascular:     Rate and Rhythm: Normal rate and regular rhythm.     Pulses: Normal pulses.     Heart sounds: No murmur heard.   Pulmonary:     Effort: Pulmonary effort is normal.     Breath sounds: No stridor. No wheezing, rhonchi or rales.  Abdominal:     General: Abdomen is flat. Bowel sounds are decreased.     Palpations: Abdomen is soft. There is no hepatomegaly, splenomegaly or mass.     Tenderness: There is abdominal tenderness in the epigastric area. There is guarding and rebound.     Comments: Hypoactive, high-pitched, tinkling bowel sounds.  Musculoskeletal:        General: Normal range of motion.     Cervical back: Neck supple.     Right lower leg: No edema.     Left lower leg: No edema.  Skin:    General: Skin is warm and dry.     Coloration: Skin is not pale.  Neurological:     General: No focal deficit present.     Mental Status: She is alert.  Psychiatric:        Mood and Affect: Mood normal.        Behavior: Behavior normal.     Lab Results  Component Value Date   WBC 7.6 05/03/2020   HGB 14.2 05/03/2020   HCT 42.9 05/03/2020   PLT 261.0 05/03/2020   GLUCOSE 95 05/03/2020   CHOL 208 (H) 05/03/2020   TRIG 92.0 05/03/2020   HDL 69.90 05/03/2020   LDLCALC 120 (H) 05/03/2020   ALT 10 05/03/2020   AST 19 05/03/2020   NA 139 05/03/2020   K 4.0 05/03/2020   CL 103 05/03/2020    CREATININE 0.89 05/03/2020   BUN 13 05/03/2020   CO2 24 05/03/2020   TSH 3.12 05/03/2020   HGBA1C 5.8 05/03/2020    CT Head Wo Contrast  Result Date: 01/21/2020 CLINICAL DATA:  Dizziness, left arm pain, worsening confusion EXAM: CT HEAD WITHOUT CONTRAST TECHNIQUE: Contiguous axial images were obtained from the base of the skull through the vertex without intravenous contrast. COMPARISON:  None. FINDINGS: Brain: Normal anatomic configuration. Mild periventricular white matter changes are present likely reflecting  the sequela of small vessel ischemia. No abnormal intra or extra-axial mass lesion or fluid collection. No abnormal mass effect or midline shift. No evidence of acute intracranial hemorrhage or infarct. Ventricular size is normal. Cerebellum unremarkable. Vascular: Unremarkable Skull: Intact Sinuses/Orbits: There is opacification of the right frontal sinus and several ethmoid air cells bilaterally. No air-fluid levels are identified. Orbits are unremarkable. Other: Mastoid air cells and middle ear cavities are clear. IMPRESSION: 1. No acute intracranial abnormality. 2. Mild periventricular white matter changes likely reflecting the sequela of small vessel ischemia. 3. Opacification of the right frontal sinus and several ethmoid air cells bilaterally in keeping with changes of sinusitis. This appears slightly improved since prior examination. Electronically Signed   By: Fidela Salisbury MD   On: 01/21/2020 22:25   DG ABD ACUTE 2+V W 1V CHEST  Result Date: 05/17/2020 CLINICAL DATA:  Chronic upper abdominal pain EXAM: DG ABDOMEN ACUTE WITH 1 VIEW CHEST COMPARISON:  08/15/2017 FINDINGS: Supine and upright frontal views of the abdomen and pelvis as well as an upright frontal view of the chest are obtained. Cardiac silhouette is stable. No airspace disease, effusion, or pneumothorax. There is a dilated loop of distended gas-filled small bowel within the left upper quadrant, measuring up to 5.2 cm in  diameter. Gas fluid level seen within the left upper quadrant. There is a paucity of distal small bowel gas and colonic gas. No free gas in the greater peritoneal sac. No masses or abnormal calcifications. IMPRESSION: 1. Findings compatible with high-grade small bowel obstruction. 2. No acute intrathoracic process. Electronically Signed   By: Randa Ngo M.D.   On: 05/17/2020 16:24    Assessment & Plan:   [place] was seen today for abdominal pain.  Diagnoses and all orders for this visit:  Epigastric abdominal tenderness with rebound tenderness- She has a high-grade small bowel obstruction.  She will be transferred to the ED via EMS. -     DG ABD ACUTE 2+V W 1V CHEST; Future  SBO (small bowel obstruction) ([place])   I am having Angelica [name] "Lincoln Maxin" maintain her acetaminophen, vitamin B-12, cholecalciferol, polyethylene glycol powder, montelukast, albuterol, AMBULATORY NON FORMULARY MEDICATION, loratadine, Magnesium Hydroxide (PHILLIPS MILK OF MAGNESIA PO), losartan, traMADol, ipratropium, omeprazole, EPINEPHrine, budesonide-formoterol, Linzess, amLODipine, iron polysaccharides, and pravastatin. We will continue to administer Benralizumab.  No orders of the defined types were placed in this encounter.    Follow-up: No follow-ups on file.  Scarlette Calico, MD

## 2020-05-17 NOTE — Consult Note (Signed)
Reason for Consult: Abdominal pain Referring Physician: [place]  Angelica [name] is an 79 y.o. female.  HPI: Patient sent tonight from her PCPs office due to abdominal pain.  She had a for 1 day.  Had a bowel movement this morning.  Abdominal pain is diffuse crampy in nature throughout her abdomen.  A 6 plain abdominal film revealed a single loop of dilated small bowel and she was sent here with a diagnosis of small bowel structure.  She has had no other work-up or blood work.  Surgery was consulted prior to any work-up.  Patient is a history of exporter laparotomy for small bowel structure in 2016.  This is by Dr. Rosendo Gros.  She complains of diffuse abdominal pain.  Her daughter is at the bedside and provides additional history of throwing up at about 4:00 this afternoon.  Past Medical History:  Diagnosis Date  . Arthritis   . Asthma   . Bowel obstruction ([place])   . Environmental allergies   . GERD (gastroesophageal reflux disease)   . Glaucoma 05/09/2017  . H. pylori infection   . HLD (hyperlipidemia) 01/16/2019  . Hypertension   . Iron deficiency anemia   . Osteopenia 05/26/2017  . Thyroid disease   . Urticaria 07/25/2018    Past Surgical History:  Procedure Laterality Date  . ABDOMINAL HYSTERECTOMY    . BREAST BIOPSY    . COLON SURGERY    . KNEE SURGERY    . LAPAROTOMY N/A 04/18/2015   Procedure: Exploratory laparotomy with lysis of adhesions, possible bowel resection;  Surgeon: Ralene Ok, MD;  Location: [facility];  Service: General;  Laterality: N/A;  . NASAL SINUS SURGERY    . THYROID SURGERY      Family History  Problem Relation Age of Onset  . Diabetes Mother   . Hypertension Mother   . Hypertension Father   . Glaucoma Sister   . Glaucoma Brother   . Allergic rhinitis Neg Hx   . Angioedema Neg Hx   . Asthma Neg Hx   . Eczema Neg Hx   . Immunodeficiency Neg Hx   . Urticaria Neg Hx     Social History:  reports that she has quit smoking. Her smoking use included  cigarettes. She has never used smokeless tobacco. She reports that she does not drink alcohol and does not use drugs.  Allergies:  Allergies  Allergen Reactions  . Asa Buff (Mag [Buffered Aspirin] Other (See Comments)    Excessive sweating  . Fish Allergy Other (See Comments)    Unknown reaction  . Other Other (See Comments)    Gelcaps; gel-containing capsules; extended release tablets  . Peanut-Containing Drug Products Other (See Comments)    From allergy test  . Penicillins Itching    Has patient had a PCN reaction causing immediate rash, facial/tongue/throat swelling, SOB or lightheadedness with hypotension: No Has patient had a PCN reaction causing severe rash involving mucus membranes or skin necrosis: No Has patient had a PCN reaction that required hospitalization No Has patient had a PCN reaction occurring within the last 10 years: No If all of the above answers are "NO", then may proceed with Cephalosporin use.  . Shellfish-Derived Products Other (See Comments)    From allergy test  . Strawberry Extract Other (See Comments)    Worsened allergy symptoms    Medications: I have reviewed the patient's current medications.  Results for orders placed or performed during the hospital encounter of 05/17/20 (from the past 48 hour(s))  Lipase, blood     Status: None   Collection Time: 05/17/20  5:56 PM  Result Value Ref Range   Lipase 24 11 - 51 U/L    Comment: Performed at [facility], [place] [address], [place], [place] [address]  Comprehensive metabolic panel     Status: Abnormal   Collection Time: 05/17/20  5:56 PM  Result Value Ref Range   Sodium 135 135 - 145 mmol/L   Potassium 3.7 3.5 - 5.1 mmol/L   Chloride 100 98 - 111 mmol/L   CO2 22 22 - 32 mmol/L   Glucose, Bld 141 (H) 70 - 99 mg/dL    Comment: Glucose reference range applies only to samples taken after fasting for at least 8 hours.   BUN 10 8 - 23 mg/dL   Creatinine, Ser 0.79 0.44 - 1.00 mg/dL   Calcium 9.8  8.9 - 10.3 mg/dL   Total Protein 8.0 6.5 - 8.1 g/dL   Albumin 4.0 3.5 - 5.0 g/dL   AST 28 15 - 41 U/L   ALT 15 0 - 44 U/L   Alkaline Phosphatase 91 38 - 126 U/L   Total Bilirubin 0.5 0.3 - 1.2 mg/dL   GFR, Estimated >60 >60 mL/min    Comment: (NOTE) Calculated using the CKD-EPI Creatinine Equation (2021)    Anion gap 13 5 - 15    Comment: Performed at [facility] [address] [place] [address]  CBC     Status: Abnormal   Collection Time: 05/17/20  5:56 PM  Result Value Ref Range   WBC 6.5 4.0 - 10.5 K/uL   RBC 5.06 3.87 - 5.11 MIL/uL   Hemoglobin 15.1 (H) 12.0 - 15.0 g/dL   HCT 45.5 36.0 - 46.0 %   MCV 89.9 80.0 - 100.0 fL   MCH 29.8 26.0 - 34.0 pg   MCHC 33.2 30.0 - 36.0 g/dL   RDW 13.5 11.5 - 15.5 %   Platelets 274 150 - 400 K/uL   nRBC 0.0 0.0 - 0.2 %    Comment: Performed at [facility], [place] [address], [place], [place] [address]    DG ABD ACUTE 2+V W 1V CHEST  Result Date: 05/17/2020 CLINICAL DATA:  Chronic upper abdominal pain EXAM: DG ABDOMEN ACUTE WITH 1 VIEW CHEST COMPARISON:  08/15/2017 FINDINGS: Supine and upright frontal views of the abdomen and pelvis as well as an upright frontal view of the chest are obtained. Cardiac silhouette is stable. No airspace disease, effusion, or pneumothorax. There is a dilated loop of distended gas-filled small bowel within the left upper quadrant, measuring up to 5.2 cm in diameter. Gas fluid level seen within the left upper quadrant. There is a paucity of distal small bowel gas and colonic gas. No free gas in the greater peritoneal sac. No masses or abnormal calcifications. IMPRESSION: 1. Findings compatible with high-grade small bowel obstruction. 2. No acute intrathoracic process. Electronically Signed   By: Randa Ngo M.D.   On: 05/17/2020 16:24    Review of Systems  Gastrointestinal: Positive for abdominal pain, nausea and vomiting. Negative for blood in stool and diarrhea.  All other systems  reviewed and are negative.  Blood pressure 133/86, pulse 76, temperature (!) 97.5 F (36.4 C), temperature source Oral, resp. rate 20, SpO2 100 %. Physical Exam HENT:     Head: Normocephalic.  Cardiovascular:     Rate and Rhythm: Normal rate and regular rhythm.  Pulmonary:  Effort: Pulmonary effort is normal.     Breath sounds: Normal breath sounds.  Abdominal:     Palpations: Abdomen is soft.     Tenderness: There is generalized abdominal tenderness. There is no guarding or rebound.    Musculoskeletal:        General: Normal range of motion.  Skin:    General: Skin is warm and dry.  Neurological:     General: No focal deficit present.     Mental Status: She is alert.  Psychiatric:        Mood and Affect: Mood normal.     Assessment/Plan: Abdominal pain  History of previous small bowel obstruction  No work-up to review at this point since consultation called with minimal work-up.   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 status for now  Recommend medicine admission at this point since nonsurgical abdomen currently.  Small bowel protocol to follow once work-up complete to exclude other potential causes of abdominal pain  Discussed with daughter at bedside.  Tully Burgo A Kelcie Currie 05/17/2020, 7:23 PM

## 2020-05-17 NOTE — Patient Instructions (Signed)
Abdominal Pain, Adult Pain in the abdomen (abdominal pain) can be caused by many things. Often, abdominal pain is not serious and it gets better with no treatment or by being treated at home. However, sometimes abdominal pain is serious. Your health care provider will ask questions about your medical history and do a physical exam to try to determine the cause of your abdominal pain. Follow these instructions at home:  Medicines  Take over-the-counter and prescription medicines only as told by your health care provider.  Do not take a laxative unless told by your health care provider. General instructions  Watch your condition for any changes.  Drink enough fluid to keep your urine pale yellow.  Keep all follow-up visits as told by your health care provider. This is important. Contact a health care provider if:  Your abdominal pain changes or gets worse.  You are not hungry or you lose weight without trying.  You are constipated or have diarrhea for more than 2-3 days.  You have pain when you urinate or have a bowel movement.  Your abdominal pain wakes you up at night.  Your pain gets worse with meals, after eating, or with certain foods.  You are vomiting and cannot keep anything down.  You have a fever.  You have blood in your urine. Get help right away if:  Your pain does not go away as soon as your health care provider told you to expect.  You cannot stop vomiting.  Your pain is only in areas of the abdomen, such as the right side or the left lower portion of the abdomen. Pain on the right side could be caused by appendicitis.  You have bloody or black stools, or stools that look like tar.  You have severe pain, cramping, or bloating in your abdomen.  You have signs of dehydration, such as: ? Dark urine, very little urine, or no urine. ? Cracked lips. ? Dry mouth. ? Sunken eyes. ? Sleepiness. ? Weakness.  You have trouble breathing or chest  pain. Summary  Often, abdominal pain is not serious and it gets better with no treatment or by being treated at home. However, sometimes abdominal pain is serious.  Watch your condition for any changes.  Take over-the-counter and prescription medicines only as told by your health care provider.  Contact a health care provider if your abdominal pain changes or gets worse.  Get help right away if you have severe pain, cramping, or bloating in your abdomen. This information is not intended to replace advice given to you by your health care provider. Make sure you discuss any questions you have with your health care provider. Document Revised: 09/15/2018 Document Reviewed: 09/15/2018 Elsevier Patient Education  2020 [employer]  

## 2020-05-17 NOTE — ED Triage Notes (Signed)
Pt BIB [facility] fro [facility]. C/o abd pain starting today, PCP scanned her showed a SBO. They sent her here for further evaluation.  Pt vomiting in triage   BP 140/80 HR 110 CBG 92

## 2020-05-17 NOTE — H&P (Signed)
History and Physical    Angelica [name] LDJ:[id number] DOB: 1940/11/06 DOA: 05/17/2020  PCP: [name], MD   Patient coming from: Home   Chief Complaint: Abd pain, N/V   HPI: Angelica [name] is a 79 y.o. female with medical history significant for hypertension and thyroid disease, SBO in 2016, now presenting to the emergency department with abdominal pain, nausea, and vomiting.  Patient is accompanied by her daughter who assists with the history.  The patient has had some intermittent abdominal pain for a few days but this worsened significantly today and she also developed nausea with vomiting this afternoon.  She has not had any fevers, chills, cough, or shortness of breath.  She reports a normal bowel movement earlier today.  ED Course: Upon arrival to the ED, patient is found to be afebrile, saturating mid 90s on room air, and with stable blood pressure.  EKG features sinus rhythm.  Chest x-ray negative for acute cardiopulmonary disease and KUB concerning for SBO.  CT abdomen and pelvis is notable for small bowel obstruction with transition in the mid central abdomen likely due to adhesions.  No evidence for perforation or ischemia on CT but incidental notation is made of a 2.5 cm left lower lobe pulmonary nodule with irregular margins.  Chemistry panel 0.1 and CBC are unremarkable.  Surgery was consulted by the ED physician and orders were placed for NG tube, morphine, and Zofran.  COVID-19 screening test not yet resulted.  Review of Systems:  All other systems reviewed and apart from HPI, are negative.  Past Medical History:  Diagnosis Date  . Arthritis   . Asthma   . Bowel obstruction ([place])   . Environmental allergies   . GERD (gastroesophageal reflux disease)   . Glaucoma 05/09/2017  . H. pylori infection   . HLD (hyperlipidemia) 01/16/2019  . Hypertension   . Iron deficiency anemia   . Osteopenia 05/26/2017  . Thyroid disease   . Urticaria 07/25/2018    Past Surgical  History:  Procedure Laterality Date  . ABDOMINAL HYSTERECTOMY    . BREAST BIOPSY    . COLON SURGERY    . KNEE SURGERY    . LAPAROTOMY N/A 04/18/2015   Procedure: Exploratory laparotomy with lysis of adhesions, possible bowel resection;  Surgeon: Ralene Ok, MD;  Location: [facility];  Service: General;  Laterality: N/A;  . NASAL SINUS SURGERY    . THYROID SURGERY      Social History:   reports that she has quit smoking. Her smoking use included cigarettes. She has never used smokeless tobacco. She reports that she does not drink alcohol and does not use drugs.  Allergies  Allergen Reactions  . Asa Buff (Mag [Buffered Aspirin] Other (See Comments)    Excessive sweating  . Fish Allergy Other (See Comments)    Unknown reaction, but allergic  . Other Other (See Comments)    Gelcaps; gel-containing capsules; extended-release medication  . Peanut-Containing Drug Products Other (See Comments)    From allergy test  . Penicillins Itching    Has patient had a PCN reaction causing immediate rash, facial/tongue/throat swelling, SOB or lightheadedness with hypotension: No Has patient had a PCN reaction causing severe rash involving mucus membranes or skin necrosis: No Has patient had a PCN reaction that required hospitalization No Has patient had a PCN reaction occurring within the last 10 years: No If all of the above answers are "NO", then may proceed with Cephalosporin use.  Gardiner Fanti Products Other (  See Comments)    From allergy test  . Strawberry Extract Other (See Comments)    Worsened allergy symptoms    Family History  Problem Relation Age of Onset  . Diabetes Mother   . Hypertension Mother   . Hypertension Father   . Glaucoma Sister   . Glaucoma Brother   . Allergic rhinitis Neg Hx   . Angioedema Neg Hx   . Asthma Neg Hx   . Eczema Neg Hx   . Immunodeficiency Neg Hx   . Urticaria Neg Hx      Prior to Admission medications   Medication Sig Start Date End  Date Taking? Authorizing Provider  acetaminophen (TYLENOL) 500 MG tablet Take 500 mg by mouth daily as needed for mild pain, moderate pain, fever or headache. For pain   Yes [provider]  amLODipine (NORVASC) 10 MG tablet Take 1 tablet by mouth once daily Patient taking differently: Take 10 mg by mouth daily. 04/28/20  Yes [name], MD  Benralizumab [facility] PEN) 30 MG/ML SOAJ Inject into the skin every 8 (eight) weeks.   Yes [provider]  budesonide-formoterol (SYMBICORT) 80-4.5 MCG/ACT inhaler Inhale 2 puffs into the lungs 2 (two) times daily. Use for asthma flares 2 puffs twice a day for 2 weeks or until cough and wheeze free Patient taking differently: Inhale 2 puffs into the lungs 2 (two) times daily as needed (for asthma flares, for 2 weeks, or until cough and wheeze-free). 01/28/20  Yes Padgett, Rae Halsted, MD  Cholecalciferol (VITAMIN D3) 50 MCG (2000 UT) TABS Take 2,000 microcuries/1.69m2 by mouth daily.   Yes [provider]  EPINEPHrine (EPIPEN 2-PAK) 0.3 mg/0.3 mL IJ SOAJ injection Use as directed for severe allergic reaction 01/28/20  Yes Padgett, Rae Halsted, MD  ipratropium (ATROVENT) 0.06 % nasal spray Place 2 sprays into both nostrils 3 (three) times daily. 11/12/19  Yes Padgett, Rae Halsted, MD  LINZESS 72 MCG capsule TAKE 1 CAPSULE BY MOUTH ONCE DAILY AS NEEDED Patient taking differently: Take 72 mcg by mouth daily as needed (for constipation). 04/22/20  Yes [name], MD  loratadine (CLARITIN) 10 MG tablet Take 10 mg by mouth daily.   Yes [provider]  losartan (COZAAR) 100 MG tablet Take 1 tablet (100 mg total) by mouth daily. 08/12/19  Yes [name], MD  Magnesium Hydroxide (PHILLIPS MILK OF MAGNESIA PO) Take 15-30 mLs by mouth daily as needed (for constipation).   Yes [provider]  montelukast (SINGULAIR) 10 MG tablet Take 1 tablet (10 mg total) by mouth at bedtime. Patient taking differently: Take 10  mg by mouth at bedtime as needed (for flares). 07/09/18  Yes [name], MD  omeprazole (PRILOSEC) 40 MG capsule TAKE 1 CAPSULE DAILY FOR ACID REFLUX Patient taking differently: Take 40 mg by mouth daily as needed (for heartburn). 12/30/19  Yes [name], MD  polyethylene glycol powder (GLYCOLAX/MIRALAX) powder Take 17 g by mouth daily as needed for mild constipation (MIX AND DRINK).   Yes [provider]  pravastatin (PRAVACHOL) 40 MG tablet Take 40 mg by mouth daily.   Yes [provider]  vitamin B-12 (CYANOCOBALAMIN) 1000 MCG tablet Take 1,000 mcg by mouth daily.   Yes [provider]  iron polysaccharides (FERREX 150) 150 MG capsule Take 1 capsule (150 mg total) by mouth daily. 05/11/20   [name], MD  pravastatin (PRAVACHOL) 80 MG tablet Take 1 tablet (80 mg total) by mouth  daily. 05/11/20   [name], MD  traMADol (ULTRAM) 50 MG tablet Take 1 tablet (50 mg total) by mouth every 6 (six) hours as needed. Patient not taking: No sig reported 08/12/19   [name], MD    Physical Exam: Vitals:   05/17/20 1807 05/17/20 1845  BP: (!) 153/92 133/86  Pulse: 83 76  Resp: (!) 24 20  Temp: (!) 97.5 F (36.4 C)   TempSrc: Oral   SpO2: 100% 100%    Constitutional: NAD, calm  Eyes: PERTLA, lids and conjunctivae normal ENMT: Mucous membranes are moist. Posterior pharynx clear of any exudate or lesions.   Neck: normal, supple, no masses, no thyromegaly Respiratory:  no wheezing, no crackles. No accessory muscle use.  Cardiovascular: S1 & S2 heard, regular rate and rhythm. No extremity edema.   Abdomen: Mild generalized tenderness without rebound pain or guarding. High-pitched bowel sounds.  Musculoskeletal: no clubbing / cyanosis. No joint deformity upper and lower extremities.   Skin: no significant rashes, lesions, ulcers. Warm, dry, well-perfused. Neurologic: CN 2-12 grossly intact. Sensation intact. Strength 5/5 in all 4 limbs.  Psychiatric:  Alert and oriented to person, place, and situation. Pleasant and cooperative.    Labs and Imaging on Admission: I have personally reviewed following labs and imaging studies  CBC: Recent Labs  Lab 05/17/20 1756  WBC 6.5  HGB 15.1*  HCT 45.5  MCV 89.9  PLT 161   Basic Metabolic Panel: Recent Labs  Lab 05/17/20 1756  NA 135  K 3.7  CL 100  CO2 22  GLUCOSE 141*  BUN 10  CREATININE 0.79  CALCIUM 9.8   GFR: Estimated Creatinine Clearance: 59.1 mL/min (by C-G formula based on SCr of 0.79 mg/dL). Liver Function Tests: Recent Labs  Lab 05/17/20 1756  AST 28  ALT 15  ALKPHOS 91  BILITOT 0.5  PROT 8.0  ALBUMIN 4.0   Recent Labs  Lab 05/17/20 1756  LIPASE 24   No results for input(s): AMMONIA in the last 168 hours. Coagulation Profile: No results for input(s): INR, PROTIME in the last 168 hours. Cardiac Enzymes: No results for input(s): CKTOTAL, CKMB, CKMBINDEX, TROPONINI in the last 168 hours. BNP (last 3 results) No results for input(s): PROBNP in the last 8760 hours. HbA1C: No results for input(s): HGBA1C in the last 72 hours. CBG: No results for input(s): GLUCAP in the last 168 hours. Lipid Profile: No results for input(s): CHOL, HDL, LDLCALC, TRIG, CHOLHDL, LDLDIRECT in the last 72 hours. Thyroid Function Tests: No results for input(s): TSH, T4TOTAL, FREET4, T3FREE, THYROIDAB in the last 72 hours. Anemia Panel: No results for input(s): VITAMINB12, FOLATE, FERRITIN, TIBC, IRON, RETICCTPCT in the last 72 hours. Urine analysis:    Component Value Date/Time   COLORURINE YELLOW 05/03/2020 [place] 05/03/2020 1707   LABSPEC 1.020 05/03/2020 1707   PHURINE 6.0 05/03/2020 1707   GLUCOSEU NEGATIVE 05/03/2020 1707   HGBUR NEGATIVE 05/03/2020 1707   BILIRUBINUR NEGATIVE 05/03/2020 1707   KETONESUR NEGATIVE 05/03/2020 1707   PROTEINUR NEGATIVE 08/15/2017 0045   UROBILINOGEN 0.2 05/03/2020 1707   NITRITE NEGATIVE 05/03/2020 1707    LEUKOCYTESUR NEGATIVE 05/03/2020 1707   Sepsis Labs: @LABRCNTIP (procalcitonin:4,lacticidven:4) )No results found for this or any previous visit (from the past 240 hour(s)).   Radiological Exams on Admission: CT ABDOMEN PELVIS W CONTRAST  Result Date: 05/17/2020 CLINICAL DATA:  Abdominal pain. Vomiting. Possible bowel obstruction on radiograph. EXAM: CT ABDOMEN AND PELVIS WITH CONTRAST TECHNIQUE: Multidetector CT imaging  of the abdomen and pelvis was performed using the standard protocol following bolus administration of intravenous contrast. CONTRAST:  111mL OMNIPAQUE IOHEXOL 300 MG/ML  SOLN COMPARISON:  Radiographs earlier today.  CT 02/04/2017 FINDINGS: Lower chest: Lobulated 2.5 cm left lower lobe pulmonary nodule with irregular margins, increased in size from prior exam. No pleural fluid or acute airspace disease. Hepatobiliary: Mild hepatic steatosis. No focal hepatic lesion. Gallbladder physiologically distended, no calcified stone. No biliary dilatation. Pancreas: Parenchymal atrophy. No ductal dilatation or inflammation. Spleen: Normal in size without focal abnormality. Adrenals/Urinary Tract: Normal adrenal glands. No hydronephrosis or perinephric edema. Homogeneous renal enhancement with symmetric excretion on delayed phase imaging. Tiny hypodensity in the upper left kidney is too small to accurately characterize. Urinary bladder is physiologically distended without wall thickening. Slight cystocele. Stomach/Bowel: Fluid-filled distended gastric cardia. Dilated fluid-filled proximal small bowel. Transition from dilated to nondilated in the mid central abdomen, series 4, image 51. More distal small bowel is decompressed. Mild mesenteric edema and small free fluid. No pneumatosis. The appendix is not confidently visualized on the current exam. Majority of the colon is decompressed. Occasional descending and sigmoid diverticula without diverticulitis. There is pelvic floor descent with small-bowel  loops coursing posterior to the bladder. Vascular/Lymphatic: No acute vascular findings are evidence of vascular compromise. Aortic atherosclerosis. Patent portal vein. Patent mesenteric vessels. No enlarged lymph nodes in the abdomen or pelvis. Reproductive: Status post hysterectomy. No adnexal masses. Other: Small volume of non organized free fluid in both upper quadrants, scattered throughout the mesentery and in the pelvis. No free air or perforation. No focal fluid collection/abscess. Small fat containing upper ventral abdominal wall hernia. Musculoskeletal: Diffuse lumbar degenerative change. No acute osseous abnormality or focal osseous lesion. Scattered subcutaneous granuloma in both gluteal regions/flanks. IMPRESSION: 1. Small bowel obstruction with transition point in the mid central abdomen, likely due to adhesions. Small volume of non organized free fluid in the abdomen and pelvis. No perforation or evidence of ischemia. 2. Lobulated 2.5 cm left lower lobe pulmonary nodule with irregular margins, increased in size from prior exam. This is suspicious for primary bronchogenic malignancy. Recommend dedicated chest CT (with IV contrast) for complete evaluation of the thorax. 3. Mild hepatic steatosis. 4. Pelvic floor descent with small-bowel loops coursing posterior to the bladder. Aortic Atherosclerosis (ICD10-I70.0). Electronically Signed   By: Keith Rake M.D.   On: 05/17/2020 19:40   DG ABD ACUTE 2+V W 1V CHEST  Result Date: 05/17/2020 CLINICAL DATA:  Chronic upper abdominal pain EXAM: DG ABDOMEN ACUTE WITH 1 VIEW CHEST COMPARISON:  08/15/2017 FINDINGS: Supine and upright frontal views of the abdomen and pelvis as well as an upright frontal view of the chest are obtained. Cardiac silhouette is stable. No airspace disease, effusion, or pneumothorax. There is a dilated loop of distended gas-filled small bowel within the left upper quadrant, measuring up to 5.2 cm in diameter. Gas fluid level seen  within the left upper quadrant. There is a paucity of distal small bowel gas and colonic gas. No free gas in the greater peritoneal sac. No masses or abnormal calcifications. IMPRESSION: 1. Findings compatible with high-grade small bowel obstruction. 2. No acute intrathoracic process. Electronically Signed   By: Randa Ngo M.D.   On: 05/17/2020 16:24    EKG: Independently reviewed. Sinus rhythm.   Assessment/Plan   1. SBO  - Presents with abdominal and N/V and is found to have SBO  - No evidence for perforation or ischemia on imaging, no peritoneal signs on  exam  - Surgery consulting and much appreciated  - NGT decompression, IVF, pain-control     2. Lung nodule  - LLL nodule concerning for primary bronchogenic malignancy noted incidentally on CT abd/pelvis in ED  - Discussed this finding and concern for cancer with patient and her daughter  - CT chest with contrast ordered    3. Asthma  - No recent cough or wheeze  - Continue as needed albuterol     4. Hypertension  - BP at goal, treat as-needed for now    DVT prophylaxis: Lovenox  Code Status: Full  Family Communication: Daughter updated at bedside   Disposition Plan:  Patient is from: Home  Anticipated d/c is to: TBD Anticipated d/c date is: 05/21/20  Patient currently: Pending resolution of SBO  Consults called: Surgery  Admission status: Inpatient     Vianne Bulls, MD [facility]  05/17/2020, 8:25 PM

## 2020-05-18 ENCOUNTER — Inpatient Hospital Stay (HOSPITAL_COMMUNITY): Payer: Medicare HMO

## 2020-05-18 DIAGNOSIS — K56609 Unspecified intestinal obstruction, unspecified as to partial versus complete obstruction: Secondary | ICD-10-CM | POA: Diagnosis not present

## 2020-05-18 LAB — BASIC METABOLIC PANEL
Anion gap: 12 (ref 5–15)
BUN: 8 mg/dL (ref 8–23)
CO2: 22 mmol/L (ref 22–32)
Calcium: 9.5 mg/dL (ref 8.9–10.3)
Chloride: 102 mmol/L (ref 98–111)
Creatinine, Ser: 0.87 mg/dL (ref 0.44–1.00)
GFR, Estimated: >60 mL/min (ref >60)
Glucose, Bld: 191 mg/dL — ABNORMAL HIGH (ref 70–99)
Potassium: 3.5 mmol/L (ref 3.5–5.1)
Sodium: 136 mmol/L (ref 135–145)

## 2020-05-18 LAB — CBC
HCT: 47 % — ABNORMAL HIGH (ref 36.0–46.0)
Hemoglobin: 16 g/dL — ABNORMAL HIGH (ref 12.0–15.0)
MCH: 30.5 pg (ref 26.0–34.0)
MCHC: 34 g/dL (ref 30.0–36.0)
MCV: 89.5 fL (ref 80.0–100.0)
Platelets: 295 10*3/uL (ref 150–400)
RBC: 5.25 MIL/uL — ABNORMAL HIGH (ref 3.87–5.11)
RDW: 13.6 % (ref 11.5–15.5)
WBC: 10.1 10*3/uL (ref 4.0–10.5)
nRBC: 0 % (ref 0.0–0.2)

## 2020-05-18 IMAGING — DX DG ABD PORTABLE 1V
1 series · 1 of 1 positions shown · non-contrast
Comparison: [DATE]

CLINICAL DATA: Small bowel obstruction

EXAM:
PORTABLE ABDOMEN - 1 VIEW

[abdomen]
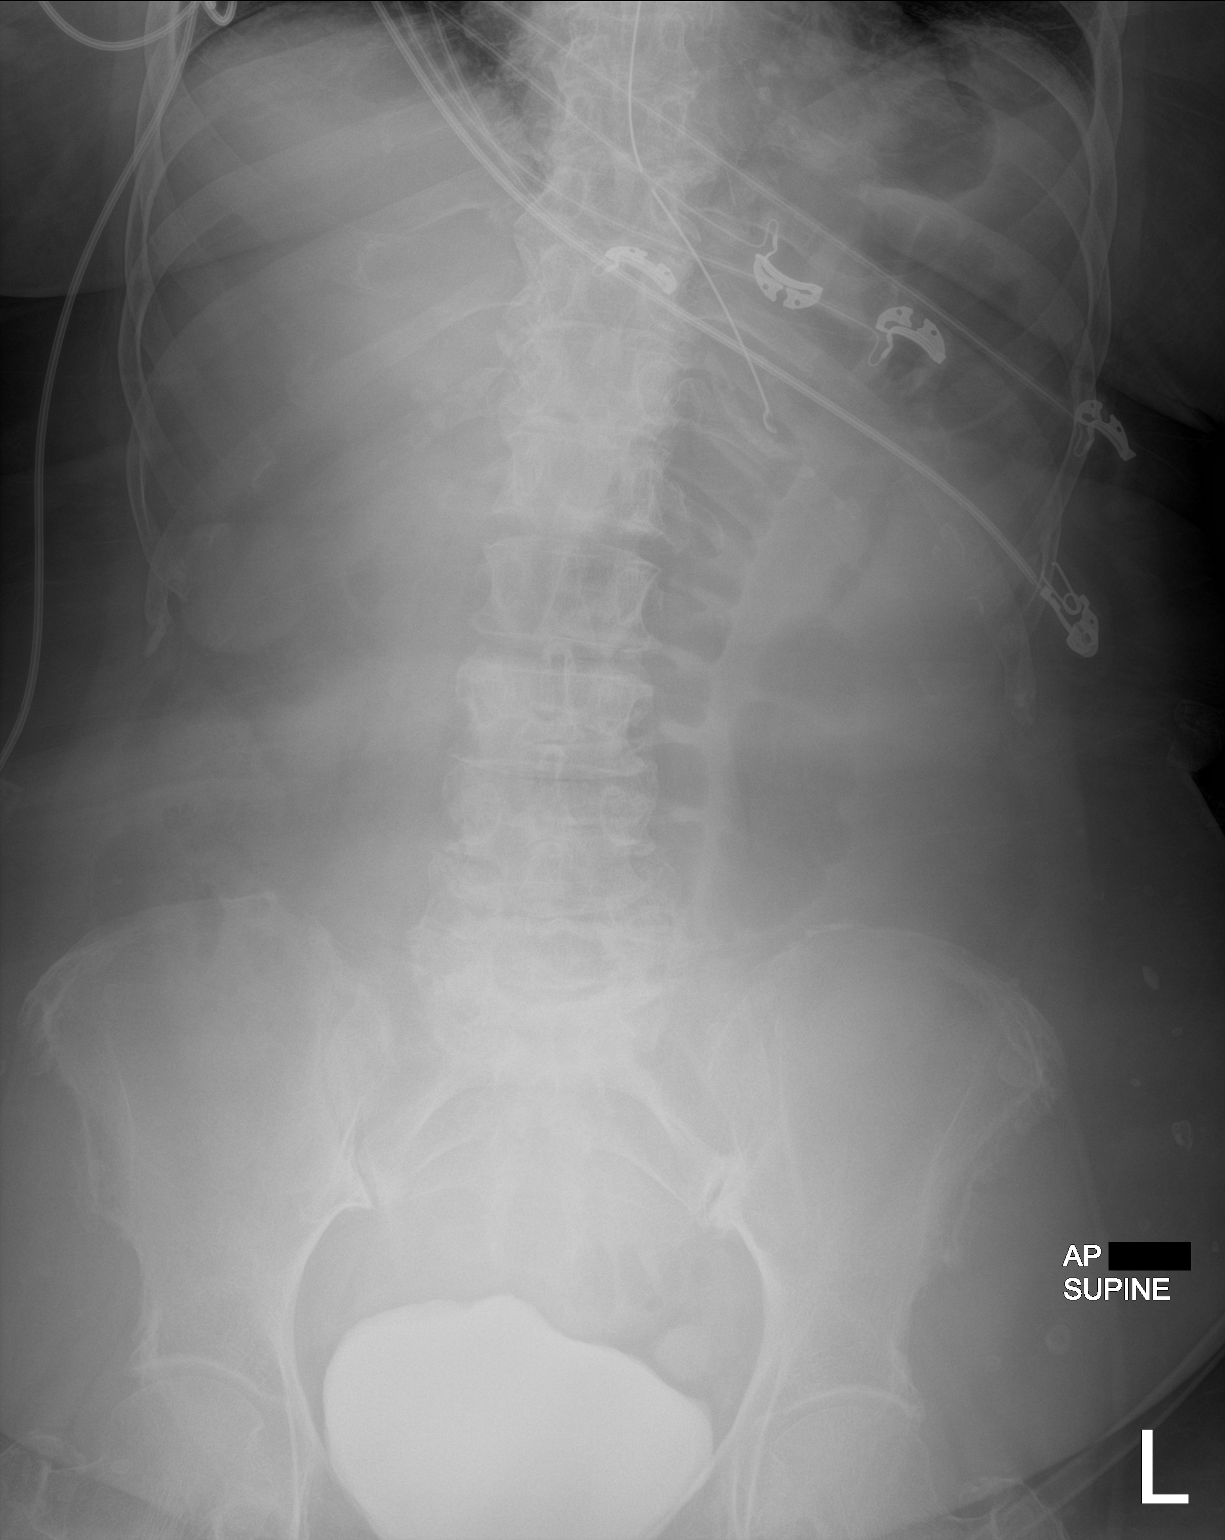

[1 of 1 positions shown; findings below may reference images not displayed]

FINDINGS: Enteric tube has been placed with tip in the left upper quadrant.
The proximal side hole projects over the EG junction region,
possibly at or just below the EG junction. Advancement is suggested.
Persistent gaseous distention of left upper quadrant bowel, similar
to prior study. Residual contrast material in the bladder.
IMPRESSION: Enteric tube tip is in the left upper quadrant with proximal side
hole at or just below the EG junction. Advancement is suggested.

## 2020-05-18 MED ORDER — ALBUTEROL SULFATE HFA 108 (90 BASE) MCG/ACT IN AERS
2.0000 | INHALATION_SPRAY | Freq: Once | RESPIRATORY_TRACT | Status: DC | PRN
Start: 2020-05-18 — End: 2020-05-18

## 2020-05-18 MED ORDER — FAMOTIDINE 20 MG IN NS 100 ML IVPB: 20.0000 mg | Freq: Once | INTRAVENOUS | Status: DC | PRN

## 2020-05-18 MED ORDER — SODIUM CHLORIDE 0.9 % IV SOLN: INTRAVENOUS | Status: DC | PRN

## 2020-05-18 MED ORDER — DIPHENHYDRAMINE HCL 50 MG/ML IJ SOLN: 50.0000 mg | Freq: Once | INTRAMUSCULAR | Status: DC | PRN

## 2020-05-18 MED ORDER — DIATRIZOATE MEGLUMINE & SODIUM 66-10 % PO SOLN
90.0000 mL | Freq: Once | ORAL | Status: AC
  Administered 2020-05-18: 90 mL via NASOGASTRIC
  Filled 2020-05-18: qty 90

## 2020-05-18 MED ORDER — SODIUM CHLORIDE 0.9 % IV SOLN
INTRAVENOUS | Status: DC | PRN
  Administered 2020-05-23: 1000 mL via INTRAVENOUS

## 2020-05-18 MED ORDER — SODIUM CHLORIDE 0.9 % IV SOLN
1200.0000 mg | Freq: Once | INTRAVENOUS | Status: AC
  Administered 2020-05-18: 1200 mg via INTRAVENOUS
  Filled 2020-05-18: qty 10

## 2020-05-18 MED ORDER — ALBUTEROL SULFATE HFA 108 (90 BASE) MCG/ACT IN AERS
2.0000 | INHALATION_SPRAY | Freq: Once | RESPIRATORY_TRACT | Status: DC | PRN
  Filled 2020-05-18: qty 6.7

## 2020-05-18 MED ORDER — SODIUM CHLORIDE 0.9 % IV SOLN
Freq: Once | INTRAVENOUS | Status: DC
  Filled 2020-05-18: qty 20

## 2020-05-18 MED ORDER — PROMETHAZINE HCL 25 MG/ML IJ SOLN
12.5000 mg | Freq: Four times a day (QID) | INTRAMUSCULAR | Status: DC | PRN
  Administered 2020-05-18 – 2020-05-31 (×3): 12.5 mg via INTRAVENOUS
  Filled 2020-05-18 (×3): qty 1

## 2020-05-18 MED ORDER — METHYLPREDNISOLONE SODIUM SUCC 125 MG IJ SOLR: 125.0000 mg | Freq: Once | INTRAMUSCULAR | Status: DC | PRN

## 2020-05-18 MED ORDER — EPINEPHRINE 0.3 MG/0.3ML IJ SOAJ: 0.3000 mg | Freq: Once | INTRAMUSCULAR | Status: DC | PRN

## 2020-05-18 MED ORDER — KCL IN DEXTROSE-NACL 10-5-0.45 MEQ/L-%-% IV SOLN
INTRAVENOUS | Status: DC
  Filled 2020-05-18 (×3): qty 1000

## 2020-05-18 MED ORDER — FAMOTIDINE 20 MG IN NS 100 ML IVPB
20.0000 mg | Freq: Once | INTRAVENOUS | Status: DC | PRN
  Filled 2020-05-18: qty 100

## 2020-05-18 MED ORDER — EPINEPHRINE 0.3 MG/0.3ML IJ SOAJ
0.3000 mg | Freq: Once | INTRAMUSCULAR | Status: DC | PRN
  Filled 2020-05-18: qty 0.6

## 2020-05-18 MED ORDER — SODIUM CHLORIDE 0.9 % IV SOLN: 1200.0000 mg | Freq: Once | INTRAVENOUS | Status: DC

## 2020-05-18 NOTE — ED Notes (Signed)
RN attempted report 

## 2020-05-18 NOTE — Progress Notes (Signed)
PROGRESS NOTE    Angelica [name]  LNL:[id number] DOB: [birth year] DOA: 05/17/2020 PCP: [name], MD  Brief Narrative:Angelica [name] is a 79 y.o. female with medical history significant for hypertension and thyroid disease, SBO in 2016, presented to the emergency department 12/20 with abdominal pain, nausea, and vomiting. -Work-up in the ED noted incidental diagnosis of Covid and a CT abdomen pelvis which was concerning for small bowel obstruction with transition in the mid central abdomen likely from adhesions in addition CT chest noted left lower lobe lung mass   Assessment & Plan:   SBO  - Presents with abdominal and N/V and is found to have SBO  -This is likely secondary to additions from prior surgery, general surgery following -Continue IV fluids, NG decompression -Small bowel protocol -Labs in a.m.  Left lower lobe lung mass  -With mediastinal and hilar lymphadenopathy concerning for primary bronchogenic carcinoma -Will request pulmonary evaluation once SBO has resolved  SARS COVID-19 infection -Asymptomatic, she is vaccinated -Discussed diagnosis with the patient and recommended monoclonal antibody infusion as she is elderly and at risk of progression, she consented to this   Asthma  - No wheezing, continue albuterol PRN  Hypertension  - BP at goal, treat as-needed for now   DVT prophylaxis: Lovenox Code Status: Full code Family Communication: No family at bedside will update daughter Disposition Plan:  Status is: Inpatient  Remains inpatient appropriate because:Inpatient level of care appropriate due to severity of illness   Dispo: The patient is from: Home              Anticipated d/c is to: Home              Anticipated d/c date is: > 3 days              Patient currently is not medically stable to d/c.   Consultants:  General surgery  Procedures:   Antimicrobials:    Subjective: -Feels nauseated but overall better after NG tube  placement Objective: Vitals:   05/18/20 1324 05/18/20 1325 05/18/20 1330 05/18/20 1400  BP:   (!) 143/99 132/85  Pulse: 82 79 93 77  Resp: 17 16 (!) 22 15  Temp:      TempSrc:      SpO2: 100% 100% 100% 100%    Intake/Output Summary (Last 24 hours) at 05/18/2020 1421 Last data filed at 05/18/2020 1323 Gross per 24 hour  Intake 90 ml  Output 500 ml  Net -410 ml   There were no vitals filed for this visit.  Examination:  General exam: Chronically ill elderly female sitting up in bed, AAOx3 HEENT: NG tube to intermittent suction CVS: S1-S2, regular rate rhythm Lungs: Decreased breath sounds to bases Abdomen: Soft, mildly distended, bowel sounds diminished Extremities: No edema Skin: No rashes on exposed skin Psychiatry: Judgement and insight appear normal. Mood & affect appropriate.     Data Reviewed:   CBC: Recent Labs  Lab 05/17/20 1756 05/18/20 0319  WBC 6.5 10.1  HGB 15.1* 16.0*  HCT 45.5 47.0*  MCV 89.9 89.5  PLT 274 408   Basic Metabolic Panel: Recent Labs  Lab 05/17/20 1756 05/18/20 0319  NA 135 136  K 3.7 3.5  CL 100 102  CO2 22 22  GLUCOSE 141* 191*  BUN 10 8  CREATININE 0.79 0.87  CALCIUM 9.8 9.5   GFR: Estimated Creatinine Clearance: 54.4 mL/min (by C-G formula based on SCr of 0.87 mg/dL). Liver Function Tests: Recent Labs  Lab 05/17/20 1756  AST 28  ALT 15  ALKPHOS 91  BILITOT 0.5  PROT 8.0  ALBUMIN 4.0   Recent Labs  Lab 05/17/20 1756  LIPASE 24   No results for input(s): AMMONIA in the last 168 hours. Coagulation Profile: No results for input(s): INR, PROTIME in the last 168 hours. Cardiac Enzymes: No results for input(s): CKTOTAL, CKMB, CKMBINDEX, TROPONINI in the last 168 hours. BNP (last 3 results) No results for input(s): PROBNP in the last 8760 hours. HbA1C: No results for input(s): HGBA1C in the last 72 hours. CBG: No results for input(s): GLUCAP in the last 168 hours. Lipid Profile: No results for input(s):  CHOL, HDL, LDLCALC, TRIG, CHOLHDL, LDLDIRECT in the last 72 hours. Thyroid Function Tests: No results for input(s): TSH, T4TOTAL, FREET4, T3FREE, THYROIDAB in the last 72 hours. Anemia Panel: No results for input(s): VITAMINB12, FOLATE, FERRITIN, TIBC, IRON, RETICCTPCT in the last 72 hours. Urine analysis:    Component Value Date/Time   COLORURINE YELLOW 05/03/2020 1707   APPEARANCEUR CLEAR 05/03/2020 1707   LABSPEC 1.020 05/03/2020 1707   PHURINE 6.0 05/03/2020 1707   GLUCOSEU NEGATIVE 05/03/2020 1707   HGBUR NEGATIVE 05/03/2020 1707   BILIRUBINUR NEGATIVE 05/03/2020 1707   KETONESUR NEGATIVE 05/03/2020 1707   PROTEINUR NEGATIVE 08/15/2017 0045   UROBILINOGEN 0.2 05/03/2020 1707   NITRITE NEGATIVE 05/03/2020 1707   LEUKOCYTESUR NEGATIVE 05/03/2020 1707   Sepsis Labs: @LABRCNTIP (procalcitonin:4,lacticidven:4)  ) Recent Results (from the past 240 hour(s))  Resp Panel by RT-PCR (Flu A&B, Covid) Nasopharyngeal Swab     Status: Abnormal   Collection Time: 05/17/20  8:48 PM   Specimen: Nasopharyngeal Swab; Nasopharyngeal(NP) swabs in vial transport medium  Result Value Ref Range Status   SARS Coronavirus 2 by RT PCR POSITIVE (A) NEGATIVE Final    Comment: RESULT CALLED TO, READ BACK BY AND VERIFIED WITH: J NEWTON RN 05/17/20 2213 JDW (NOTE) SARS-CoV-2 target nucleic acids are DETECTED.  The SARS-CoV-2 RNA is generally detectable in upper respiratory specimens during the acute phase of infection. Positive results are indicative of the presence of the identified virus, but do not rule out bacterial infection or co-infection with other pathogens not detected by the test. Clinical correlation with patient history and other diagnostic information is necessary to determine patient infection status. The expected result is Negative.  Fact Sheet for Patients: EntrepreneurPulse.com.au  Fact Sheet for Healthcare  Providers: IncredibleEmployment.be  This test is not yet approved or cleared by the [place] FDA and  has been authorized for detection and/or diagnosis of SARS-CoV-2 by FDA under an Emergency Use Authorization (EUA).  This EUA will remain in effect (meaning this test can be use d) for the duration of  the COVID-19 declaration under Section 564(b)(1) of the Act, 21 U.S.C. section 360bbb-3(b)(1), unless the authorization is terminated or revoked sooner.     Influenza A by PCR NEGATIVE NEGATIVE Final   Influenza B by PCR NEGATIVE NEGATIVE Final    Comment: (NOTE) The Xpert Xpress SARS-CoV-2/FLU/RSV plus assay is intended as an aid in the diagnosis of influenza from Nasopharyngeal swab specimens and should not be used as a sole basis for treatment. Nasal washings and aspirates are unacceptable for Xpert Xpress SARS-CoV-2/FLU/RSV testing.  Fact Sheet for Patients: EntrepreneurPulse.com.au  Fact Sheet for Healthcare Providers: IncredibleEmployment.be  This test is not yet approved or cleared by the [place] FDA and has been authorized for detection and/or diagnosis of SARS-CoV-2 by FDA under an Emergency Use Authorization (EUA). This EUA  will remain in effect (meaning this test can be used) for the duration of the COVID-19 declaration under Section 564(b)(1) of the Act, 21 U.S.C. section 360bbb-3(b)(1), unless the authorization is terminated or revoked.  Performed at [facility], [place] [address] [place], [place] [address]          Radiology Studies: CT CHEST W CONTRAST  Result Date: 05/17/2020 CLINICAL DATA:  May 17, 2020, abdominal CT. FINDINGS: Cardiovascular: Atheromatous plaque in the thoracic aorta. No aneurysmal dilation. Noncalcified plaque as well. Normal heart size. Three-vessel coronary artery disease. Central pulmonary vasculature unremarkable on venous phase assessment. Mediastinum/Nodes:  Heterogeneous and enlarged thyroid gland, multinodular changes. No axillary lymphadenopathy. Enlarged peribronchial lymph node on image 69 of series 3 a 13 mm. Borderline enlarged LEFT infrahilar lymph node at 9 mm on image 71 of series 3. Subcarinal lymph node at 14 mm on image 61 of series 3. No additional mediastinal or hilar nodal enlargement. Esophagus mildly patulous. Lungs/Pleura: Pulmonary mass measuring 3.5 x 2.8 cm on image 94 of series 4 with spiculated margins contacting the visceral pleura in the LEFT lower lobe, situated in the medial aspect of the LEFT lower lobe. Direct extension of the mass versus is infra hilar lymph node on image 88 of series 4 measuring 13 mm. Bronchial narrowing of secondary bronchial structures at the level of the mass beneath the LEFT hilum. Airways are otherwise patent. Tiny nodule in the RIGHT chest on image 75 series 4 approximately 2-3 mm. Upper Abdomen: Incidental imaging of upper abdominal contents again with small amount of perisplenic fluid and marked small bowel dilation. Visible adrenal glands are normal. There is excreted contrast within the kidneys partially visualized. Musculoskeletal: No acute musculoskeletal finding. No destructive bone lesion. Spinal degenerative changes. IMPRESSION: 1. LEFT lower lobe mass with LEFT hilar and mediastinal adenopathy most suggestive of bronchogenic carcinoma. Referral to multi disciplinary thoracic oncologic setting with PET or biopsy for further assessment is suggested. 2. Tiny 2-3 mm nodule in the RIGHT chest on image 75 series 4. Attention on follow-up. 3. Signs of bowel obstruction and fluid in the upper abdomen about the spleen partially imaged. 4. Enlarged and heterogeneous thyroid. Recommend thyroid ultrasound (ref: J Am Coll Radiol. 2015 Feb;12(2): 143-50). 5. Three-vessel coronary artery disease. 6. Aortic atherosclerosis Aortic Atherosclerosis (ICD10-I70.0). Electronically Signed   By: Zetta Bills M.D.   On:  05/17/2020 21:21   CT ABDOMEN PELVIS W CONTRAST  Result Date: 05/17/2020 CLINICAL DATA:  Abdominal pain. Vomiting. Possible bowel obstruction on radiograph. EXAM: CT ABDOMEN AND PELVIS WITH CONTRAST TECHNIQUE: Multidetector CT imaging of the abdomen and pelvis was performed using the standard protocol following bolus administration of intravenous contrast. CONTRAST:  124mL OMNIPAQUE IOHEXOL 300 MG/ML  SOLN COMPARISON:  Radiographs earlier today.  CT 02/04/2017 FINDINGS: Lower chest: Lobulated 2.5 cm left lower lobe pulmonary nodule with irregular margins, increased in size from prior exam. No pleural fluid or acute airspace disease. Hepatobiliary: Mild hepatic steatosis. No focal hepatic lesion. Gallbladder physiologically distended, no calcified stone. No biliary dilatation. Pancreas: Parenchymal atrophy. No ductal dilatation or inflammation. Spleen: Normal in size without focal abnormality. Adrenals/Urinary Tract: Normal adrenal glands. No hydronephrosis or perinephric edema. Homogeneous renal enhancement with symmetric excretion on delayed phase imaging. Tiny hypodensity in the upper left kidney is too small to accurately characterize. Urinary bladder is physiologically distended without wall thickening. Slight cystocele. Stomach/Bowel: Fluid-filled distended gastric cardia. Dilated fluid-filled proximal small bowel. Transition from dilated to nondilated in the mid central abdomen,  series 4, image 51. More distal small bowel is decompressed. Mild mesenteric edema and small free fluid. No pneumatosis. The appendix is not confidently visualized on the current exam. Majority of the colon is decompressed. Occasional descending and sigmoid diverticula without diverticulitis. There is pelvic floor descent with small-bowel loops coursing posterior to the bladder. Vascular/Lymphatic: No acute vascular findings are evidence of vascular compromise. Aortic atherosclerosis. Patent portal vein. Patent mesenteric vessels.  No enlarged lymph nodes in the abdomen or pelvis. Reproductive: Status post hysterectomy. No adnexal masses. Other: Small volume of non organized free fluid in both upper quadrants, scattered throughout the mesentery and in the pelvis. No free air or perforation. No focal fluid collection/abscess. Small fat containing upper ventral abdominal wall hernia. Musculoskeletal: Diffuse lumbar degenerative change. No acute osseous abnormality or focal osseous lesion. Scattered subcutaneous granuloma in both gluteal regions/flanks. IMPRESSION: 1. Small bowel obstruction with transition point in the mid central abdomen, likely due to adhesions. Small volume of non organized free fluid in the abdomen and pelvis. No perforation or evidence of ischemia. 2. Lobulated 2.5 cm left lower lobe pulmonary nodule with irregular margins, increased in size from prior exam. This is suspicious for primary bronchogenic malignancy. Recommend dedicated chest CT (with IV contrast) for complete evaluation of the thorax. 3. Mild hepatic steatosis. 4. Pelvic floor descent with small-bowel loops coursing posterior to the bladder. Aortic Atherosclerosis (ICD10-I70.0). Electronically Signed   By: Keith Rake M.D.   On: 05/17/2020 19:40   DG ABD ACUTE 2+V W 1V CHEST  Result Date: 05/17/2020 CLINICAL DATA:  Chronic upper abdominal pain EXAM: DG ABDOMEN ACUTE WITH 1 VIEW CHEST COMPARISON:  08/15/2017 FINDINGS: Supine and upright frontal views of the abdomen and pelvis as well as an upright frontal view of the chest are obtained. Cardiac silhouette is stable. No airspace disease, effusion, or pneumothorax. There is a dilated loop of distended gas-filled small bowel within the left upper quadrant, measuring up to 5.2 cm in diameter. Gas fluid level seen within the left upper quadrant. There is a paucity of distal small bowel gas and colonic gas. No free gas in the greater peritoneal sac. No masses or abnormal calcifications. IMPRESSION: 1.  Findings compatible with high-grade small bowel obstruction. 2. No acute intrathoracic process. Electronically Signed   By: Randa Ngo M.D.   On: 05/17/2020 16:24   DG Abd Portable 1 View  Result Date: 05/17/2020 CLINICAL DATA:  Nasogastric tube placement EXAM: PORTABLE ABDOMEN - 1 VIEW COMPARISON:  05/17/2020 FINDINGS: Nasogastric tube extends into the upper abdomen with its tip overlying the expected proximal to mid body of the stomach. Visualized lung bases are clear. Dilated loops of gas-filled small bowel are seen within the left upper quadrant suggestive of underlying small-bowel obstruction, however, the mid and inferior abdomen are excluded from view. No gross free intraperitoneal gas within the visualized abdomen. IMPRESSION: Nasogastric tube extends into the proximal to mid body of the stomach. Electronically Signed   By: Fidela Salisbury MD   On: 05/17/2020 23:03        Scheduled Meds: . Benralizumab  30 mg Subcutaneous Q8 Weeks  . enoxaparin (LOVENOX) injection  40 mg Subcutaneous Q24H   Continuous Infusions: . dextrose 5 % and 0.45 % NaCl with KCl 10 mEq/L 75 mL/hr at 05/17/20 2300     LOS: 1 day    Time spent: 5min  Domenic Polite, MD [facility]  05/18/2020, 2:21 PM

## 2020-05-18 NOTE — ED Notes (Signed)
Patient Daughter asked for a callback to discuss lab results for mother, she was here yesterday but was told that she could not come back due to positive covid test and daughter had some clinical questions. Callback number is [phone], [name]

## 2020-05-18 NOTE — ED Notes (Addendum)
Pt NG is not clogged at this time. Eugenio Hoes 3 way stopcock was used to perform inspection of NG tube. NG tube air was inserted with no wetness noted.Pt at this time reports no nausea or vomiting.Inserted 45mls of gastrografin in NG. NG clamped at this time. X-ray was called to insert they are aware of insertion of contrast. Pt HOB elevated.

## 2020-05-18 NOTE — ED Notes (Signed)
Pt dry heaving, producing frothy sputum.  Beginning to administer phenergan per Dr. Criss Rosales order.

## 2020-05-18 NOTE — ED Notes (Signed)
Pt NG is not clogged at this time. Eugenio Hoes 3 way stopcock was used to perform inspection of NG tube. NG tube air was inserted with no wetness noted.Pt at this time reports no nausea or vomiting. Per Order. Pt head is elevated. Pt has had 260mls of output since 0830am. Pt NGT hooked to low intermitted suction.

## 2020-05-18 NOTE — Progress Notes (Signed)
[facility] Progress Note     Subjective: CC-  Feeling better since NG tube placement. Abdominal pain resolved. Denies n/v. No flatus or BM. Patient unaware that she was covid+ prior to admission. Denies any symptoms of this including no fever, chill, cough, shortness of breath, or congestion.  Objective: Vital signs in last 24 hours: Temp:  [97.5 F (36.4 C)-98.4 F (36.9 C)] 98.4 F (36.9 C) (12/29 0651) Pulse Rate:  [73-97] 90 (12/29 0730) Resp:  [12-25] 21 (12/29 0730) BP: (123-165)/(75-108) 158/96 (12/29 0730) SpO2:  [90 %-100 %] 99 % (12/29 0730) Weight:  [81.6 kg] 81.6 kg (12/28 1520)    Intake/Output from previous day: No intake/output data recorded. Intake/Output this shift: No intake/output data recorded.  PE: Gen:  Alert, NAD, pleasant HEENT: EOM's intact, pupils equal and round Card:  RRR Pulm:  CTAB, no W/R/R, rate and effort normal Abd: Soft, NT/ND, few BS heard, no HSM Skin: no rashes noted, warm and dry  Lab Results:  Recent Labs    05/17/20 1756 05/18/20 0319  WBC 6.5 10.1  HGB 15.1* 16.0*  HCT 45.5 47.0*  PLT 274 295   BMET Recent Labs    05/17/20 1756 05/18/20 0319  NA 135 136  K 3.7 3.5  CL 100 102  CO2 22 22  GLUCOSE 141* 191*  BUN 10 8  CREATININE 0.79 0.87  CALCIUM 9.8 9.5   PT/INR No results for input(s): LABPROT, INR in the last 72 hours. CMP     Component Value Date/Time   NA 136 05/18/2020 0319   K 3.5 05/18/2020 0319   CL 102 05/18/2020 0319   CO2 22 05/18/2020 0319   GLUCOSE 191 (H) 05/18/2020 0319   BUN 8 05/18/2020 0319   CREATININE 0.87 05/18/2020 0319   CALCIUM 9.5 05/18/2020 0319   PROT 8.0 05/17/2020 1756   PROT 7.7 06/07/2017 0940   ALBUMIN 4.0 05/17/2020 1756   AST 28 05/17/2020 1756   ALT 15 05/17/2020 1756   ALKPHOS 91 05/17/2020 1756   BILITOT 0.5 05/17/2020 1756   GFRNONAA >60 05/18/2020 0319   GFRAA >60 08/15/2017 0012   Lipase     Component Value Date/Time   LIPASE 24  05/17/2020 1756       Studies/Results: CT CHEST W CONTRAST  Result Date: 05/17/2020 CLINICAL DATA:  May 17, 2020, abdominal CT. FINDINGS: Cardiovascular: Atheromatous plaque in the thoracic aorta. No aneurysmal dilation. Noncalcified plaque as well. Normal heart size. Three-vessel coronary artery disease. Central pulmonary vasculature unremarkable on venous phase assessment. Mediastinum/Nodes: Heterogeneous and enlarged thyroid gland, multinodular changes. No axillary lymphadenopathy. Enlarged peribronchial lymph node on image 69 of series 3 a 13 mm. Borderline enlarged LEFT infrahilar lymph node at 9 mm on image 71 of series 3. Subcarinal lymph node at 14 mm on image 61 of series 3. No additional mediastinal or hilar nodal enlargement. Esophagus mildly patulous. Lungs/Pleura: Pulmonary mass measuring 3.5 x 2.8 cm on image 94 of series 4 with spiculated margins contacting the visceral pleura in the LEFT lower lobe, situated in the medial aspect of the LEFT lower lobe. Direct extension of the mass versus is infra hilar lymph node on image 88 of series 4 measuring 13 mm. Bronchial narrowing of secondary bronchial structures at the level of the mass beneath the LEFT hilum. Airways are otherwise patent. Tiny nodule in the RIGHT chest on image 75 series 4 approximately 2-3 mm. Upper Abdomen: Incidental imaging of upper abdominal contents again with small amount of  perisplenic fluid and marked small bowel dilation. Visible adrenal glands are normal. There is excreted contrast within the kidneys partially visualized. Musculoskeletal: No acute musculoskeletal finding. No destructive bone lesion. Spinal degenerative changes. IMPRESSION: 1. LEFT lower lobe mass with LEFT hilar and mediastinal adenopathy most suggestive of bronchogenic carcinoma. Referral to multi disciplinary thoracic oncologic setting with PET or biopsy for further assessment is suggested. 2. Tiny 2-3 mm nodule in the RIGHT chest on image 75  series 4. Attention on follow-up. 3. Signs of bowel obstruction and fluid in the upper abdomen about the spleen partially imaged. 4. Enlarged and heterogeneous thyroid. Recommend thyroid ultrasound (ref: J Am Coll Radiol. 2015 Feb;12(2): 143-50). 5. Three-vessel coronary artery disease. 6. Aortic atherosclerosis Aortic Atherosclerosis (ICD10-I70.0). Electronically Signed   By: Zetta Bills M.D.   On: 05/17/2020 21:21   CT ABDOMEN PELVIS W CONTRAST  Result Date: 05/17/2020 CLINICAL DATA:  Abdominal pain. Vomiting. Possible bowel obstruction on radiograph. EXAM: CT ABDOMEN AND PELVIS WITH CONTRAST TECHNIQUE: Multidetector CT imaging of the abdomen and pelvis was performed using the standard protocol following bolus administration of intravenous contrast. CONTRAST:  128mL OMNIPAQUE IOHEXOL 300 MG/ML  SOLN COMPARISON:  Radiographs earlier today.  CT 02/04/2017 FINDINGS: Lower chest: Lobulated 2.5 cm left lower lobe pulmonary nodule with irregular margins, increased in size from prior exam. No pleural fluid or acute airspace disease. Hepatobiliary: Mild hepatic steatosis. No focal hepatic lesion. Gallbladder physiologically distended, no calcified stone. No biliary dilatation. Pancreas: Parenchymal atrophy. No ductal dilatation or inflammation. Spleen: Normal in size without focal abnormality. Adrenals/Urinary Tract: Normal adrenal glands. No hydronephrosis or perinephric edema. Homogeneous renal enhancement with symmetric excretion on delayed phase imaging. Tiny hypodensity in the upper left kidney is too small to accurately characterize. Urinary bladder is physiologically distended without wall thickening. Slight cystocele. Stomach/Bowel: Fluid-filled distended gastric cardia. Dilated fluid-filled proximal small bowel. Transition from dilated to nondilated in the mid central abdomen, series 4, image 51. More distal small bowel is decompressed. Mild mesenteric edema and small free fluid. No pneumatosis. The  appendix is not confidently visualized on the current exam. Majority of the colon is decompressed. Occasional descending and sigmoid diverticula without diverticulitis. There is pelvic floor descent with small-bowel loops coursing posterior to the bladder. Vascular/Lymphatic: No acute vascular findings are evidence of vascular compromise. Aortic atherosclerosis. Patent portal vein. Patent mesenteric vessels. No enlarged lymph nodes in the abdomen or pelvis. Reproductive: Status post hysterectomy. No adnexal masses. Other: Small volume of non organized free fluid in both upper quadrants, scattered throughout the mesentery and in the pelvis. No free air or perforation. No focal fluid collection/abscess. Small fat containing upper ventral abdominal wall hernia. Musculoskeletal: Diffuse lumbar degenerative change. No acute osseous abnormality or focal osseous lesion. Scattered subcutaneous granuloma in both gluteal regions/flanks. IMPRESSION: 1. Small bowel obstruction with transition point in the mid central abdomen, likely due to adhesions. Small volume of non organized free fluid in the abdomen and pelvis. No perforation or evidence of ischemia. 2. Lobulated 2.5 cm left lower lobe pulmonary nodule with irregular margins, increased in size from prior exam. This is suspicious for primary bronchogenic malignancy. Recommend dedicated chest CT (with IV contrast) for complete evaluation of the thorax. 3. Mild hepatic steatosis. 4. Pelvic floor descent with small-bowel loops coursing posterior to the bladder. Aortic Atherosclerosis (ICD10-I70.0). Electronically Signed   By: Keith Rake M.D.   On: 05/17/2020 19:40   DG ABD ACUTE 2+V W 1V CHEST  Result Date: 05/17/2020 CLINICAL DATA:  Chronic  upper abdominal pain EXAM: DG ABDOMEN ACUTE WITH 1 VIEW CHEST COMPARISON:  08/15/2017 FINDINGS: Supine and upright frontal views of the abdomen and pelvis as well as an upright frontal view of the chest are obtained. Cardiac  silhouette is stable. No airspace disease, effusion, or pneumothorax. There is a dilated loop of distended gas-filled small bowel within the left upper quadrant, measuring up to 5.2 cm in diameter. Gas fluid level seen within the left upper quadrant. There is a paucity of distal small bowel gas and colonic gas. No free gas in the greater peritoneal sac. No masses or abnormal calcifications. IMPRESSION: 1. Findings compatible with high-grade small bowel obstruction. 2. No acute intrathoracic process. Electronically Signed   By: Randa Ngo M.D.   On: 05/17/2020 16:24   DG Abd Portable 1 View  Result Date: 05/17/2020 CLINICAL DATA:  Nasogastric tube placement EXAM: PORTABLE ABDOMEN - 1 VIEW COMPARISON:  05/17/2020 FINDINGS: Nasogastric tube extends into the upper abdomen with its tip overlying the expected proximal to mid body of the stomach. Visualized lung bases are clear. Dilated loops of gas-filled small bowel are seen within the left upper quadrant suggestive of underlying small-bowel obstruction, however, the mid and inferior abdomen are excluded from view. No gross free intraperitoneal gas within the visualized abdomen. IMPRESSION: Nasogastric tube extends into the proximal to mid body of the stomach. Electronically Signed   By: Fidela Salisbury MD   On: 05/17/2020 23:03    Anti-infectives: Anti-infectives (From admission, onward)   None       Assessment/Plan HTN HLD GERD Asthma LLL lung mass - noted on CT, concerning for primary bronchogenic malignancy COVD+  SBO - PSH: tubal ligation, hysterectomy, ex lap LOA 03/2015 Dr. Rosendo Gros for SBO - CT 12/28 shows SBO with transition point in the mid central abdomen, likely due to adhesions; small volume of non organized free fluid in the abdomen and pelvis; no perforation or evidence of ischemia  ID - none FEN - IVF, NPO/NGT to LIWS VTE - SCDs, lovenox Foley - none Follow up - TBD  Plan: Abdominal symptoms resolved since NG tube  placement, but no return in bowel function. Will start small bowel protocol. Mobilize as able.    LOS: 1 day    [name], [facility] 05/18/2020, 7:58 AM Please see Amion for pager number during day hours 7:00am-4:30pm gs

## 2020-05-18 NOTE — ED Notes (Signed)
Stuck pt.x2 no blood return notified nurse janet rn

## 2020-05-18 NOTE — ED Notes (Signed)
Pt is complaining of abd pain, feels hot & cold.  Appears generally uncomfortable.  Admitting MD notified.

## 2020-05-19 ENCOUNTER — Encounter: Payer: Self-pay | Admitting: Internal Medicine

## 2020-05-19 ENCOUNTER — Inpatient Hospital Stay (HOSPITAL_COMMUNITY): Payer: Medicare HMO

## 2020-05-19 DIAGNOSIS — K56609 Unspecified intestinal obstruction, unspecified as to partial versus complete obstruction: Secondary | ICD-10-CM | POA: Diagnosis not present

## 2020-05-19 LAB — CBC
HCT: 46.9 % — ABNORMAL HIGH (ref 36.0–46.0)
Hemoglobin: 16.3 g/dL — ABNORMAL HIGH (ref 12.0–15.0)
MCH: 30.6 pg (ref 26.0–34.0)
MCHC: 34.8 g/dL (ref 30.0–36.0)
MCV: 88.2 fL (ref 80.0–100.0)
Platelets: 293 10*3/uL (ref 150–400)
RBC: 5.32 MIL/uL — ABNORMAL HIGH (ref 3.87–5.11)
RDW: 13.8 % (ref 11.5–15.5)
WBC: 7.3 10*3/uL (ref 4.0–10.5)
nRBC: 0 % (ref 0.0–0.2)

## 2020-05-19 LAB — BASIC METABOLIC PANEL
Anion gap: 13 (ref 5–15)
BUN: 12 mg/dL (ref 8–23)
CO2: 20 mmol/L — ABNORMAL LOW (ref 22–32)
Calcium: 9.4 mg/dL (ref 8.9–10.3)
Chloride: 104 mmol/L (ref 98–111)
Creatinine, Ser: 0.86 mg/dL (ref 0.44–1.00)
GFR, Estimated: >60 mL/min (ref >60)
Glucose, Bld: 145 mg/dL — ABNORMAL HIGH (ref 70–99)
Potassium: 4.2 mmol/L (ref 3.5–5.1)
Sodium: 137 mmol/L (ref 135–145)

## 2020-05-19 LAB — C-REACTIVE PROTEIN: CRP: 0.7 mg/dL (ref <1.0)

## 2020-05-19 LAB — D-DIMER, QUANTITATIVE: D-Dimer, Quant: 1.61 ug/mL-FEU — ABNORMAL HIGH (ref 0.00–0.50)

## 2020-05-19 LAB — FERRITIN: Ferritin: 150 ng/mL (ref 11–307)

## 2020-05-19 IMAGING — DX DG ABD PORTABLE 1V
1 series · 2 of 2 positions shown · non-contrast
Comparison: [DATE]

CLINICAL DATA: Small-bowel obstruction

EXAM:
PORTABLE ABDOMEN - 1 VIEW

[Series 1: abdomen · 0.14mm/px · 2 of 2 slices shown]
[im 1/2]
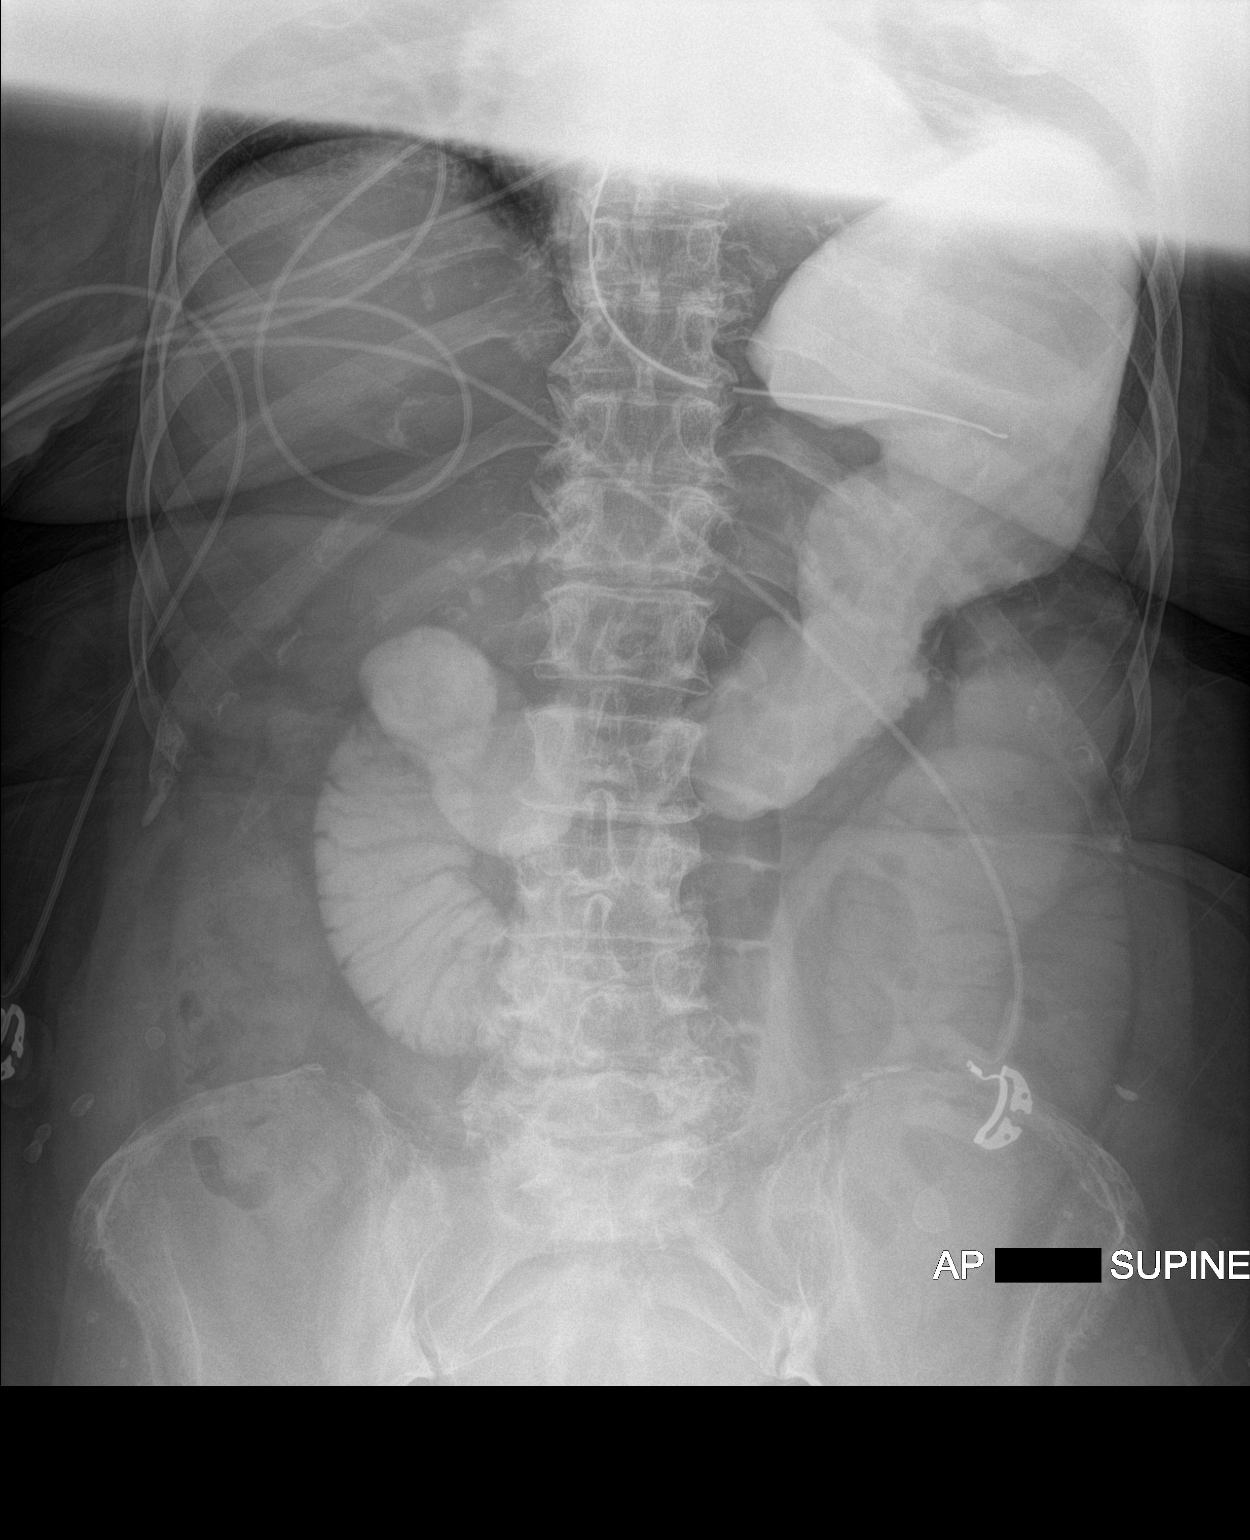
[im 2/2]
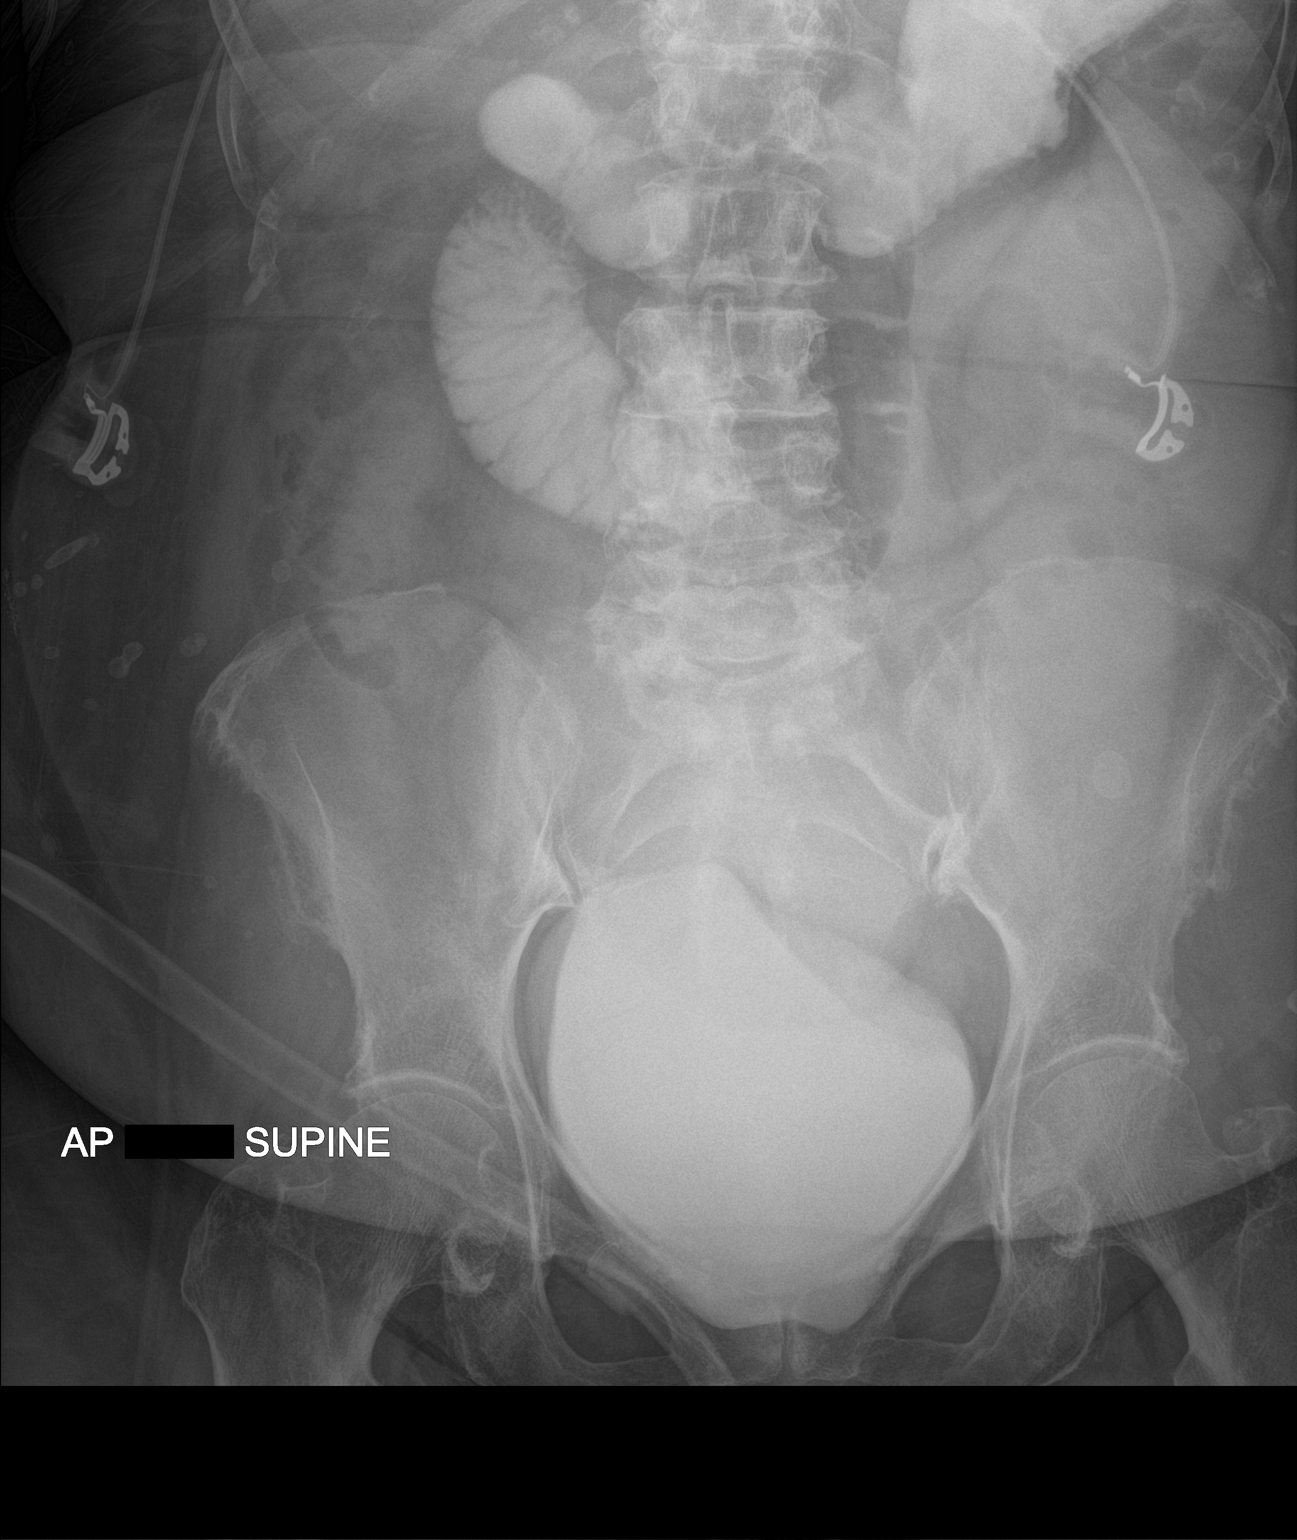

[2 of 2 positions shown; findings below may reference images not displayed]

FINDINGS: Two supine frontal views of the abdomen and pelvis are obtained.
Enteric catheter tip and side port project over the gastric fundus.
There is excreted contrast within the bladder related to previous
CT.

Oral contrast is seen within the stomach and duodenal sweep. Dilute
contrast is seen filling dilated loops of small bowel within the
left mid abdomen. No evidence of distal small bowel or colonic
contrast.
IMPRESSION: 1. No evidence of colonic contrast. Persistent dilated small bowel
consistent with small-bowel obstruction.

## 2020-05-19 MED ORDER — DIATRIZOATE MEGLUMINE & SODIUM 66-10 % PO SOLN
90.0000 mL | Freq: Once | ORAL | Status: AC
  Administered 2020-05-19: 90 mL via NASOGASTRIC
  Filled 2020-05-19: qty 90

## 2020-05-19 MED ORDER — SODIUM CHLORIDE 0.9 % IV SOLN: INTRAVENOUS | Status: AC

## 2020-05-19 NOTE — Plan of Care (Signed)
  Problem: Clinical Measurements: Goal: Will remain free from infection Outcome: Progressing Goal: Diagnostic test results will improve Outcome: Progressing   Problem: Activity: Goal: Risk for activity intolerance will decrease Outcome: Progressing   

## 2020-05-19 NOTE — Assessment & Plan Note (Signed)
Lab Results  Component Value Date   WBC 7.3 05/19/2020   HGB 16.3 (H) 05/19/2020   HCT 46.9 (H) 05/19/2020   MCV 88.2 05/19/2020   PLT 293 05/19/2020  stable overall by history and exam, recent data reviewed with pt, and pt to continue medical treatment as before,  to f/u any worsening symptoms or concerns, to d/c iron

## 2020-05-19 NOTE — Assessment & Plan Note (Signed)
BP Readings from Last 3 Encounters:  05/19/20 129/82  05/17/20 140/82  05/03/20 (!) 150/86  stable overall by history and exam, recent data reviewed with pt, and pt to continue medical treatment as before,  to f/u any worsening symptoms or concerns

## 2020-05-19 NOTE — Assessment & Plan Note (Signed)
Lab Results  Component Value Date   HGBA1C 5.8 05/03/2020  stable overall by history and exam, recent data reviewed with pt, and pt to continue medical treatment as before,  to f/u any worsening symptoms or concerns

## 2020-05-19 NOTE — Progress Notes (Signed)
Initial Nutrition Assessment  DOCUMENTATION CODES:   Not applicable  INTERVENTION:  RD to provide nutritional supplements once diet advances as appropriate per MD and care team.   NUTRITION DIAGNOSIS:   Inadequate oral intake related to inability to eat as evidenced by NPO status.  GOAL:   Patient will meet greater than or equal to 90% of their needs  MONITOR:   Skin,Weight trends,Labs,I & O's,Diet advancement  REASON FOR ASSESSMENT:   Malnutrition Screening Tool    ASSESSMENT:   78 y.o. female with medical history significant for hypertension and thyroid disease, SBO in 2016 presents with abdominal pain, nausea, and vomiting. 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   Pt is currently NPO with NG in place to LIWS. NG output 500 ml. Pt with history of abdominal pains for a couple of days prior to admission, however upon admission day pt developed worsening n/v. RD to order nutritional supplementation once pt diet advances as appropriate per MD and care team. Unable to complete Nutrition-Focused physical exam at this time.   Labs and medications reviewed.   Diet Order:   Diet Order            Diet NPO time specified Except for: Ice Chips  Diet effective now                 EDUCATION NEEDS:   Not appropriate for education at this time  Skin:  Skin Assessment: Reviewed RN Assessment  Last BM:  12/28  Height:   Ht Readings from Last 1 Encounters:  05/19/20 5\' 5"  (1.651 m)    Weight:   Wt Readings from Last 1 Encounters:  05/19/20 79.6 kg   BMI:  Body mass index is 29.2 kg/m.  Estimated Nutritional Needs:   Kcal:  1800-1950  Protein:  85-95 grams  Fluid:  >/= 1.8 L/day  Corrin Parker, MS, RD, LDN RD pager number/after hours weekend pager number on Amion.

## 2020-05-19 NOTE — Assessment & Plan Note (Signed)
Lab Results  Component Value Date   LDLCALC 120 (H) 05/03/2020  mild elevated, d/w pt, declines statin due to chance of increased constipation

## 2020-05-19 NOTE — Assessment & Plan Note (Signed)
stable overall by history and exam, recent data reviewed with pt, and pt to continue medical treatment as before,  to f/u any worsening symptoms or concerns  

## 2020-05-19 NOTE — Progress Notes (Signed)
Established Patient Office Visit  Subjective:  Patient ID: Angelica [name], female    DOB: 1940-09-07  Age: 79 y.o. MRN: [id number]      Chief Complaint: (concise statement describing the symptom, problem, condition, diagnosis, physician recommended return, or other factor as reason for encounter) follow up HTN, HLD and hyperglycemia , gerd, constipation, iron deficiency       HPI:  Angelica [name] is a 79 y.o. female here to f/u; overall doing ok,  Pt denies chest pain, increasing sob or doe, wheezing, orthopnea, PND, increased LE swelling, palpitations, dizziness or syncope.  Pt denies new neurological symptoms such as new headache, or facial or extremity weakness or numbness.  Pt denies polydipsia, polyuria, or symptomatic low sugars. Pt states overall good compliance with meds, mostly trying to follow appropriate diet, with wt overall down several lbs.  Denies worsening reflux, abd pain, dysphagia, n/v, bowel change or blood, except for worsening constapation recently, remains on tramadol prn, and asks to stop the iron if possible.   .        Wt Readings from Last 3 Encounters:  05/19/20 175 lb 7.8 oz (79.6 kg)  05/17/20 180 lb (81.6 kg)  05/03/20 182 lb (82.6 kg)   BP Readings from Last 3 Encounters:  05/19/20 129/82  05/17/20 140/82  05/03/20 (!) 150/86       Past Medical History:  Diagnosis Date  . Arthritis   . Asthma   . Bowel obstruction ([place])   . Environmental allergies   . GERD (gastroesophageal reflux disease)   . Glaucoma 05/09/2017  . H. pylori infection   . HLD (hyperlipidemia) 01/16/2019  . Hypertension   . Iron deficiency anemia   . Osteopenia 05/26/2017  . Thyroid disease   . Urticaria 07/25/2018   Past Surgical History:  Procedure Laterality Date  . ABDOMINAL HYSTERECTOMY    . BREAST BIOPSY    . COLON SURGERY    . KNEE SURGERY    . LAPAROTOMY N/A 04/18/2015   Procedure: Exploratory laparotomy with lysis of adhesions, possible bowel resection;  Surgeon:  Ralene Ok, MD;  Location: [facility];  Service: General;  Laterality: N/A;  . NASAL SINUS SURGERY    . THYROID SURGERY      reports that she has quit smoking. Her smoking use included cigarettes. She has never used smokeless tobacco. She reports that she does not drink alcohol and does not use drugs. family history includes Diabetes in her mother; Glaucoma in her brother and sister; Hypertension in her father and mother. Allergies  Allergen Reactions  . Asa Buff (Mag [Buffered Aspirin] Other (See Comments)    Excessive sweating  . Fish Allergy Other (See Comments)    Unknown reaction, but allergic  . Other Other (See Comments)    Gelcaps; gel-containing capsules; extended-release medication  . Peanut-Containing Drug Products Other (See Comments)    From allergy test  . Penicillins Itching    Has patient had a PCN reaction causing immediate rash, facial/tongue/throat swelling, SOB or lightheadedness with hypotension: No Has patient had a PCN reaction causing severe rash involving mucus membranes or skin necrosis: No Has patient had a PCN reaction that required hospitalization No Has patient had a PCN reaction occurring within the last 10 years: No If all of the above answers are "NO", then may proceed with Cephalosporin use.  Marland Kitchen Shellfish-Derived Products Other (See Comments)    From allergy test  . Strawberry Extract Other (See Comments)    Worsened allergy  symptoms   No current facility-administered medications on file prior to visit.   Current Outpatient Medications on File Prior to Visit  Medication Sig Dispense Refill  . acetaminophen (TYLENOL) 500 MG tablet Take 500 mg by mouth daily as needed for mild pain, moderate pain, fever or headache. For pain    . amLODipine (NORVASC) 10 MG tablet Take 1 tablet by mouth once daily (Patient taking differently: Take 10 mg by mouth daily.) 90 tablet 0  . budesonide-formoterol (SYMBICORT) 80-4.5 MCG/ACT inhaler Inhale 2 puffs into the lungs  2 (two) times daily. Use for asthma flares 2 puffs twice a day for 2 weeks or until cough and wheeze free (Patient taking differently: Inhale 2 puffs into the lungs 2 (two) times daily as needed (for asthma flares, for 2 weeks, or until cough and wheeze-free).) 10.2 g 5  . EPINEPHrine (EPIPEN 2-PAK) 0.3 mg/0.3 mL IJ SOAJ injection Use as directed for severe allergic reaction 2 each 1  . ipratropium (ATROVENT) 0.06 % nasal spray Place 2 sprays into both nostrils 3 (three) times daily. 15 mL 3  . LINZESS 72 MCG capsule TAKE 1 CAPSULE BY MOUTH ONCE DAILY AS NEEDED (Patient taking differently: Take 72 mcg by mouth daily as needed (for constipation).) 30 capsule 0  . loratadine (CLARITIN) 10 MG tablet Take 10 mg by mouth daily.    Marland Kitchen losartan (COZAAR) 100 MG tablet Take 1 tablet (100 mg total) by mouth daily. 90 tablet 3  . Magnesium Hydroxide (PHILLIPS MILK OF MAGNESIA PO) Take 15-30 mLs by mouth daily as needed (for constipation).    . montelukast (SINGULAIR) 10 MG tablet Take 1 tablet (10 mg total) by mouth at bedtime. (Patient taking differently: Take 10 mg by mouth at bedtime as needed (for flares).) 90 tablet 3  . omeprazole (PRILOSEC) 40 MG capsule TAKE 1 CAPSULE DAILY FOR ACID REFLUX (Patient taking differently: Take 40 mg by mouth daily as needed (for heartburn).) 90 capsule 3  . polyethylene glycol powder (GLYCOLAX/MIRALAX) powder Take 17 g by mouth daily as needed for mild constipation (MIX AND DRINK).    Marland Kitchen traMADol (ULTRAM) 50 MG tablet Take 1 tablet (50 mg total) by mouth every 6 (six) hours as needed. (Patient not taking: No sig reported) 120 tablet 2  . vitamin B-12 (CYANOCOBALAMIN) 1000 MCG tablet Take 1,000 mcg by mouth daily.          ROS:  All others reviewed and negative.  Objective        PE:  BP (!) 150/86 (BP Location: Left Arm, Patient Position: Sitting, Cuff Size: Large)   Pulse 81   Temp 97.7 F (36.5 C) (Oral)   Ht 5\' 5"  (1.651 m)   Wt 182 lb (82.6 kg)   SpO2 99%   BMI  30.29 kg/m                 Constitutional: Pt appears in NAD               HENT: Head: NCAT.                Right Ear: External ear normal.                 Left Ear: External ear normal.                Eyes: . Pupils are equal, round, and reactive to light. Conjunctivae and EOM are normal  Nose: without d/c or deformity               Neck: Neck supple. Gross normal ROM               Cardiovascular: Normal rate and regular rhythm.                 Pulmonary/Chest: Effort normal and breath sounds without rales or wheezing.                Abd:  Soft, NT, ND, + BS, no organomegaly               Neurological: Pt is alert. At baseline orientation, motor grossly intact               Skin: Skin is warm. No rashes, no other new lesions, LE edema - none               Psychiatric: Pt behavior is normal without agitation   Assessment/Plan:  Angelica [name] is a 79 y.o. Black or African American [2] female with  has a past medical history of Arthritis, Asthma, Bowel obstruction ([place]), Environmental allergies, GERD (gastroesophageal reflux disease), Glaucoma (05/09/2017), H. pylori infection, HLD (hyperlipidemia) (01/16/2019), Hypertension, Iron deficiency anemia, Osteopenia (05/26/2017), Thyroid disease, and Urticaria (07/25/2018).   Assessment Plan  See problem oriented assessment and plan Labs reviewed for each problem: Lab Results  Component Value Date   WBC 7.3 05/19/2020   HGB 16.3 (H) 05/19/2020   HCT 46.9 (H) 05/19/2020   PLT 293 05/19/2020   GLUCOSE 145 (H) 05/19/2020   CHOL 208 (H) 05/03/2020   TRIG 92.0 05/03/2020   HDL 69.90 05/03/2020   LDLCALC 120 (H) 05/03/2020   ALT 15 05/17/2020   AST 28 05/17/2020   NA 137 05/19/2020   K 4.2 05/19/2020   CL 104 05/19/2020   CREATININE 0.86 05/19/2020   BUN 12 05/19/2020   CO2 20 (L) 05/19/2020   TSH 3.12 05/03/2020   HGBA1C 5.8 05/03/2020    Micro: none  Cardiac tracings I have personally interpreted today:   none  Pertinent Radiological findings (summarize): none   I spent total 36 minutes in caring for the patient for this visit:  1) by communicating with the patient and family/caregiver during the visit  2) by review of pertinent vital sign data, physical examination and labs as documented in the assessment and plan  3) by review of pertinent imaging - none today  4) by review of pertinent procedures - colonoscopy sept 15 2020  5) by obtaining and reviewing separately obtained information from family/caretaker and Care Everywhere - none today  6) by ordering medications  7) by ordering tests  8) by documenting all of this clinical information in the EHR including the management of each problem noted today in assessment and plan  There are no preventive care reminders to display for this patient.   Problem List Items Addressed This Visit      Medium   Iron deficiency    Lab Results  Component Value Date   WBC 7.3 05/19/2020   HGB 16.3 (H) 05/19/2020   HCT 46.9 (H) 05/19/2020   MCV 88.2 05/19/2020   PLT 293 05/19/2020  stable overall by history and exam, recent data reviewed with pt, and pt to continue medical treatment as before,  to f/u any worsening symptoms or concerns, to d/c iron      Relevant Orders   IBC panel (Completed)   Hyperglycemia  Lab Results  Component Value Date   HGBA1C 5.8 05/03/2020  stable overall by history and exam, recent data reviewed with pt, and pt to continue medical treatment as before,  to f/u any worsening symptoms or concerns       Relevant Orders   Hemoglobin A1c (Completed)   HTN (hypertension)    BP Readings from Last 3 Encounters:  05/19/20 129/82  05/17/20 140/82  05/03/20 (!) 150/86  stable overall by history and exam, recent data reviewed with pt, and pt to continue medical treatment as before,  to f/u any worsening symptoms or concerns       HLD (hyperlipidemia)    Lab Results  Component Value Date   LDLCALC 120 (H)  05/03/2020  mild elevated, d/w pt, declines statin due to chance of increased constipation      Relevant Orders   Lipid panel (Completed)   Hepatic function panel (Completed)   CBC with Differential/Platelet (Completed)   TSH (Completed)   Urinalysis, Routine w reflex microscopic (Completed)   Basic metabolic panel (Completed)   GERD (gastroesophageal reflux disease)    stable overall by history and exam, recent data reviewed with pt, and pt to continue medical treatment as before,  to f/u any worsening symptoms or concerns       Constipation - Primary    For metamucil prn,  to f/u any worsening symptoms or concerns       Other Visit Diagnoses    B12 deficiency       Relevant Orders   Vitamin B12 (Completed)   Vitamin D deficiency       Relevant Orders   VITAMIN D 25 Hydroxy (Vit-D Deficiency, Fractures) (Completed)      No orders of the defined types were placed in this encounter.   Follow-up: Return in about 6 months (around 11/01/2020).   Cathlean Cower, MD 05/19/2020 3:10 PM [facility]

## 2020-05-19 NOTE — Assessment & Plan Note (Signed)
For metamucil prn,  to f/u any worsening symptoms or concerns

## 2020-05-19 NOTE — TOC Initial Note (Signed)
Transition of Care [facility]) - Initial/Assessment Note    Patient Details  Name: Angelica [name] MRN: [id number] Date of Birth: 12-May-1941  Transition of Care [facility] CM/SW Contact:    Verdell Carmine, RN Phone Number: 05/19/2020, 9:03 AM  Clinical Narrative:                 Admitted with N&V bowel obstruction. incidental positive COVID.  Receiving fluids, NG Has [employer]. Will likely need HH, Surgery consulted. CM will follow for needs.  Expected Discharge Plan: [place] Barriers to Discharge: Continued Medical Work up   Patient Goals and CMS Choice        Expected Discharge Plan and Services Expected Discharge Plan: [place]       Living arrangements for the past 2 months: Single Family Home                                      Prior Living Arrangements/Services Living arrangements for the past 2 months: Single Family Home Lives with:: Self Patient language and need for interpreter reviewed:: Yes        Need for Family Participation in Patient Care: Yes (Comment) Care giver support system in place?: Yes (comment)   Criminal Activity/Legal Involvement Pertinent to Current Situation/Hospitalization: No - Comment as needed  Activities of Daily Living Home Assistive Devices/Equipment: None ADL Screening (condition at time of admission) Patient's cognitive ability adequate to safely complete daily activities?: Yes Is the patient deaf or have difficulty hearing?: No Does the patient have difficulty seeing, even when wearing glasses/contacts?: No Does the patient have difficulty concentrating, remembering, or making decisions?: No Patient able to express need for assistance with ADLs?: Yes Does the patient have difficulty dressing or bathing?: No Independently performs ADLs?: Yes (appropriate for developmental age) Does the patient have difficulty walking or climbing stairs?: Yes Weakness of Legs: Both Weakness of  Arms/Hands: None  Permission Sought/Granted                  Emotional Assessment       Orientation: : Oriented to Situation,Oriented to  Time,Oriented to Place,Oriented to Self Alcohol / Substance Use: Not Applicable    Admission diagnosis:  Small bowel obstruction ([place]) [K56.609] SBO (small bowel obstruction) ([place]) [K56.609] Pulmonary nodule [R91.1] Encounter for imaging study to confirm nasogastric (NG) tube placement [Z01.89] Patient Active Problem List   Diagnosis Date Noted  . Epigastric abdominal tenderness with rebound tenderness 05/17/2020  . Pulmonary nodule 05/17/2020  . Iron deficiency 05/03/2020  . Dizziness 01/19/2020  . Left rotator cuff tear 11/12/2019  . Trigger thumb of right hand 11/12/2019  . Trigger finger, left ring finger 11/12/2019  . Left shoulder pain 09/29/2019  . Swelling of lower extremity 07/30/2019  . Greater trochanteric bursitis of left hip 04/07/2019  . Right ankle swelling 02/18/2019  . Ganglion cyst of right foot 01/16/2019  . HLD (hyperlipidemia) 01/16/2019  . 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 Urticaria 07/25/2018  . Low back pain 07/09/2018  . Hyperglycemia 07/09/2018  . Rash 10/10/2017  . Constipation 08/19/2017  . Hypokalemia 07/09/2017  . Asthma, severe persistent, well-controlled 06/04/2017  . 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 Nasal polyposis 06/04/2017  . Leukocytosis 06/04/2017  . Osteopenia 05/26/2017  . Encounter for well adult exam with abnormal findings 05/09/2017  . Glaucoma 05/09/2017  . GERD (gastroesophageal reflux disease)   .  H. pylori infection   . Other allergic rhinitis 03/20/2016  . Cough, persistent 03/20/2016  . Hypoglycemia 04/17/2015  . Small bowel obstruction ([place])   . Malnutrition of moderate degree 04/11/2015  . SBO (small bowel obstruction) ([place]) 04/10/2015  . HTN (hypertension) 04/10/2015   PCP:  [name], MD Pharmacy:   [facility], [address] [place] [address] Phone: [phone] Fax: [facility] [place], [place] [address] Phone: [phone] Fax: [phone]     Social Determinants of Health (SDOH) Interventions    Readmission Risk Interventions No flowsheet data found.

## 2020-05-19 NOTE — Significant Event (Signed)
Pt admitted to 5W04 from the ED via stretcher accompanied by ED transporter. Pt was able to transfer self from stretcher to bed by scooting into the bed w/o difficulty. Pt currently has IV fluids running as well as a right NG tube that was placed on ILWS w/ adequate suctioning and no discomfort. Pt alert and oriented x4.  Pt was cleaned per pt request w/ assistance from [place], NT and skin check was performed w/ Boneta Lucks, RN. No significant skin issues noted. Current VS as follows: BP (!) 160/95 (BP Location: Right Arm)   Pulse 95   Temp 98.3 F (36.8 C) (Oral)   Resp 17   Ht 5\' 5"  (1.651 m)   Wt 79.6 kg   SpO2 99%   BMI 29.20 kg/m   Pt was oriented to room and call bell and pt able to demonstrate how to call for assistance. Bed alarm turned on and ice chips provided to pt. Will continue to monitor.

## 2020-05-19 NOTE — Progress Notes (Signed)
PROGRESS NOTE    Angelica [name]  ZOX:[id number] DOB: 09-Nov-1940 DOA: 05/17/2020 PCP: [name], MD  Brief Narrative:Angelica [name] is a 79 y.o. female with medical history significant for hypertension and thyroid disease, SBO in 2016, presented to the emergency department 12/20 with abdominal pain, nausea, and vomiting. -Work-up in the ED noted incidental diagnosis of Covid and a CT abdomen pelvis which was concerning for small bowel obstruction with transition in the mid central abdomen likely from adhesions in addition CT chest noted left lower lobe lung mass   Assessment & Plan:   SBO  -This is likely secondary to adhesions from prior surgery, general surgery following -Continue IV fluids, NG decompression -Small bowel protocol -Required ex lap and lysis of adhesions back in 2016 -Labs in a.m.  Left lower lobe lung mass  -With mediastinal and hilar lymphadenopathy concerning for primary bronchogenic carcinoma -Will request pulmonary evaluation once SBO has resolved for biopsy  SARS COVID-19 infection -Asymptomatic, she is vaccinated -Given monoclonal antibody infusion 12/29   Asthma  - No wheezing, continue albuterol PRN  Hypertension  - Stable  DVT prophylaxis: Lovenox Code Status: Full code Family Communication: Updated daughter 12/29 Disposition Plan:  Status is: Inpatient  Remains inpatient appropriate because:Inpatient level of care appropriate due to severity of illness   Dispo: The patient is from: Home              Anticipated d/c is to: Home              Anticipated d/c date is: > 3 days              Patient currently is not medically stable to d/c.   Consultants:  General surgery  Procedures:   Antimicrobials:    Subjective: -Still has some mild abdominal discomfort, denies any flatus or bowel movement Objective: Vitals:   05/19/20 0105 05/19/20 0400 05/19/20 0930 05/19/20 1232  BP:  (!) 156/78 (!) 152/89 129/82  Pulse:  97 86 90   Resp:  18 18 16   Temp:  98 F (36.7 C) 98.8 F (37.1 C) 98.7 F (37.1 C)  TempSrc:  Oral Oral Axillary  SpO2:  99% 99% 97%  Weight: 79.6 kg     Height:        Intake/Output Summary (Last 24 hours) at 05/19/2020 1322 Last data filed at 05/19/2020 8119 Gross per 24 hour  Intake 714.64 ml  Output 200 ml  Net 514.64 ml   Filed Weights   05/19/20 0105  Weight: 79.6 kg    Examination:  General exam: Chronically ill elderly female sitting up in bed, AAOx3, uncomfortable appearing HEENT: NG tube to intermittent suction CVS: S1-S2, regular rate rhythm Lungs: Decreased breath sounds in both bases Abdomen: Soft, less distended today, bowel sounds diminished Extremities: No edema  Skin: No rashes on exposed skin Psychiatry:  Mood & affect appropriate.     Data Reviewed:   CBC: Recent Labs  Lab 05/17/20 1756 05/18/20 0319 05/19/20 0915  WBC 6.5 10.1 7.3  HGB 15.1* 16.0* 16.3*  HCT 45.5 47.0* 46.9*  MCV 89.9 89.5 88.2  PLT 274 295 147   Basic Metabolic Panel: Recent Labs  Lab 05/17/20 1756 05/18/20 0319 05/19/20 0706  NA 135 136 137  K 3.7 3.5 4.2  CL 100 102 104  CO2 22 22 20*  GLUCOSE 141* 191* 145*  BUN 10 8 12   CREATININE 0.79 0.87 0.86  CALCIUM 9.8 9.5 9.4   GFR: Estimated Creatinine  Clearance: 54.4 mL/min (by C-G formula based on SCr of 0.86 mg/dL). Liver Function Tests: Recent Labs  Lab 05/17/20 1756  AST 28  ALT 15  ALKPHOS 91  BILITOT 0.5  PROT 8.0  ALBUMIN 4.0   Recent Labs  Lab 05/17/20 1756  LIPASE 24   No results for input(s): AMMONIA in the last 168 hours. Coagulation Profile: No results for input(s): INR, PROTIME in the last 168 hours. Cardiac Enzymes: No results for input(s): CKTOTAL, CKMB, CKMBINDEX, TROPONINI in the last 168 hours. BNP (last 3 results) No results for input(s): PROBNP in the last 8760 hours. HbA1C: No results for input(s): HGBA1C in the last 72 hours. CBG: No results for input(s): GLUCAP in the last 168  hours. Lipid Profile: No results for input(s): CHOL, HDL, LDLCALC, TRIG, CHOLHDL, LDLDIRECT in the last 72 hours. Thyroid Function Tests: No results for input(s): TSH, T4TOTAL, FREET4, T3FREE, THYROIDAB in the last 72 hours. Anemia Panel: Recent Labs    05/19/20 0706  FERRITIN 150   Urine analysis:    Component Value Date/Time   COLORURINE YELLOW 05/03/2020 [place] 05/03/2020 1707   LABSPEC 1.020 05/03/2020 1707   PHURINE 6.0 05/03/2020 1707   GLUCOSEU NEGATIVE 05/03/2020 1707   HGBUR NEGATIVE 05/03/2020 1707   BILIRUBINUR NEGATIVE 05/03/2020 1707   KETONESUR NEGATIVE 05/03/2020 1707   PROTEINUR NEGATIVE 08/15/2017 0045   UROBILINOGEN 0.2 05/03/2020 1707   NITRITE NEGATIVE 05/03/2020 1707   LEUKOCYTESUR NEGATIVE 05/03/2020 1707   Sepsis Labs: @LABRCNTIP (procalcitonin:4,lacticidven:4)  ) Recent Results (from the past 240 hour(s))  Resp Panel by RT-PCR (Flu A&B, Covid) Nasopharyngeal Swab     Status: Abnormal   Collection Time: 05/17/20  8:48 PM   Specimen: Nasopharyngeal Swab; Nasopharyngeal(NP) swabs in vial transport medium  Result Value Ref Range Status   SARS Coronavirus 2 by RT PCR POSITIVE (A) NEGATIVE Final    Comment: RESULT CALLED TO, READ BACK BY AND VERIFIED WITH: J NEWTON RN 05/17/20 2213 JDW (NOTE) SARS-CoV-2 target nucleic acids are DETECTED.  The SARS-CoV-2 RNA is generally detectable in upper respiratory specimens during the acute phase of infection. Positive results are indicative of the presence of the identified virus, but do not rule out bacterial infection or co-infection with other pathogens not detected by the test. Clinical correlation with patient history and other diagnostic information is necessary to determine patient infection status. The expected result is Negative.  Fact Sheet for Patients: EntrepreneurPulse.com.au  Fact Sheet for Healthcare  Providers: IncredibleEmployment.be  This test is not yet approved or cleared by the [place] FDA and  has been authorized for detection and/or diagnosis of SARS-CoV-2 by FDA under an Emergency Use Authorization (EUA).  This EUA will remain in effect (meaning this test can be use d) for the duration of  the COVID-19 declaration under Section 564(b)(1) of the Act, 21 U.S.C. section 360bbb-3(b)(1), unless the authorization is terminated or revoked sooner.     Influenza A by PCR NEGATIVE NEGATIVE Final   Influenza B by PCR NEGATIVE NEGATIVE Final    Comment: (NOTE) The Xpert Xpress SARS-CoV-2/FLU/RSV plus assay is intended as an aid in the diagnosis of influenza from Nasopharyngeal swab specimens and should not be used as a sole basis for treatment. Nasal washings and aspirates are unacceptable for Xpert Xpress SARS-CoV-2/FLU/RSV testing.  Fact Sheet for Patients: EntrepreneurPulse.com.au  Fact Sheet for Healthcare Providers: IncredibleEmployment.be  This test is not yet approved or cleared by the [place] and has been authorized for  detection and/or diagnosis of SARS-CoV-2 by FDA under an Emergency Use Authorization (EUA). This EUA will remain in effect (meaning this test can be used) for the duration of the COVID-19 declaration under Section 564(b)(1) of the Act, 21 U.S.C. section 360bbb-3(b)(1), unless the authorization is terminated or revoked.  Performed at [facility], [place] [address], [place], [place] [address]          Radiology Studies: CT CHEST W CONTRAST  Result Date: 05/17/2020 CLINICAL DATA:  May 17, 2020, abdominal CT. FINDINGS: Cardiovascular: Atheromatous plaque in the thoracic aorta. No aneurysmal dilation. Noncalcified plaque as well. Normal heart size. Three-vessel coronary artery disease. Central pulmonary vasculature unremarkable on venous phase assessment. Mediastinum/Nodes:  Heterogeneous and enlarged thyroid gland, multinodular changes. No axillary lymphadenopathy. Enlarged peribronchial lymph node on image 69 of series 3 a 13 mm. Borderline enlarged LEFT infrahilar lymph node at 9 mm on image 71 of series 3. Subcarinal lymph node at 14 mm on image 61 of series 3. No additional mediastinal or hilar nodal enlargement. Esophagus mildly patulous. Lungs/Pleura: Pulmonary mass measuring 3.5 x 2.8 cm on image 94 of series 4 with spiculated margins contacting the visceral pleura in the LEFT lower lobe, situated in the medial aspect of the LEFT lower lobe. Direct extension of the mass versus is infra hilar lymph node on image 88 of series 4 measuring 13 mm. Bronchial narrowing of secondary bronchial structures at the level of the mass beneath the LEFT hilum. Airways are otherwise patent. Tiny nodule in the RIGHT chest on image 75 series 4 approximately 2-3 mm. Upper Abdomen: Incidental imaging of upper abdominal contents again with small amount of perisplenic fluid and marked small bowel dilation. Visible adrenal glands are normal. There is excreted contrast within the kidneys partially visualized. Musculoskeletal: No acute musculoskeletal finding. No destructive bone lesion. Spinal degenerative changes. IMPRESSION: 1. LEFT lower lobe mass with LEFT hilar and mediastinal adenopathy most suggestive of bronchogenic carcinoma. Referral to multi disciplinary thoracic oncologic setting with PET or biopsy for further assessment is suggested. 2. Tiny 2-3 mm nodule in the RIGHT chest on image 75 series 4. Attention on follow-up. 3. Signs of bowel obstruction and fluid in the upper abdomen about the spleen partially imaged. 4. Enlarged and heterogeneous thyroid. Recommend thyroid ultrasound (ref: J Am Coll Radiol. 2015 Feb;12(2): 143-50). 5. Three-vessel coronary artery disease. 6. Aortic atherosclerosis Aortic Atherosclerosis (ICD10-I70.0). Electronically Signed   By: Zetta Bills M.D.   On:  05/17/2020 21:21   CT ABDOMEN PELVIS W CONTRAST  Result Date: 05/17/2020 CLINICAL DATA:  Abdominal pain. Vomiting. Possible bowel obstruction on radiograph. EXAM: CT ABDOMEN AND PELVIS WITH CONTRAST TECHNIQUE: Multidetector CT imaging of the abdomen and pelvis was performed using the standard protocol following bolus administration of intravenous contrast. CONTRAST:  122mL OMNIPAQUE IOHEXOL 300 MG/ML  SOLN COMPARISON:  Radiographs earlier today.  CT 02/04/2017 FINDINGS: Lower chest: Lobulated 2.5 cm left lower lobe pulmonary nodule with irregular margins, increased in size from prior exam. No pleural fluid or acute airspace disease. Hepatobiliary: Mild hepatic steatosis. No focal hepatic lesion. Gallbladder physiologically distended, no calcified stone. No biliary dilatation. Pancreas: Parenchymal atrophy. No ductal dilatation or inflammation. Spleen: Normal in size without focal abnormality. Adrenals/Urinary Tract: Normal adrenal glands. No hydronephrosis or perinephric edema. Homogeneous renal enhancement with symmetric excretion on delayed phase imaging. Tiny hypodensity in the upper left kidney is too small to accurately characterize. Urinary bladder is physiologically distended without wall thickening. Slight cystocele. Stomach/Bowel: Fluid-filled distended gastric cardia.  Dilated fluid-filled proximal small bowel. Transition from dilated to nondilated in the mid central abdomen, series 4, image 51. More distal small bowel is decompressed. Mild mesenteric edema and small free fluid. No pneumatosis. The appendix is not confidently visualized on the current exam. Majority of the colon is decompressed. Occasional descending and sigmoid diverticula without diverticulitis. There is pelvic floor descent with small-bowel loops coursing posterior to the bladder. Vascular/Lymphatic: No acute vascular findings are evidence of vascular compromise. Aortic atherosclerosis. Patent portal vein. Patent mesenteric vessels.  No enlarged lymph nodes in the abdomen or pelvis. Reproductive: Status post hysterectomy. No adnexal masses. Other: Small volume of non organized free fluid in both upper quadrants, scattered throughout the mesentery and in the pelvis. No free air or perforation. No focal fluid collection/abscess. Small fat containing upper ventral abdominal wall hernia. Musculoskeletal: Diffuse lumbar degenerative change. No acute osseous abnormality or focal osseous lesion. Scattered subcutaneous granuloma in both gluteal regions/flanks. IMPRESSION: 1. Small bowel obstruction with transition point in the mid central abdomen, likely due to adhesions. Small volume of non organized free fluid in the abdomen and pelvis. No perforation or evidence of ischemia. 2. Lobulated 2.5 cm left lower lobe pulmonary nodule with irregular margins, increased in size from prior exam. This is suspicious for primary bronchogenic malignancy. Recommend dedicated chest CT (with IV contrast) for complete evaluation of the thorax. 3. Mild hepatic steatosis. 4. Pelvic floor descent with small-bowel loops coursing posterior to the bladder. Aortic Atherosclerosis (ICD10-I70.0). Electronically Signed   By: Keith Rake M.D.   On: 05/17/2020 19:40   DG ABD ACUTE 2+V W 1V CHEST  Result Date: 05/17/2020 CLINICAL DATA:  Chronic upper abdominal pain EXAM: DG ABDOMEN ACUTE WITH 1 VIEW CHEST COMPARISON:  08/15/2017 FINDINGS: Supine and upright frontal views of the abdomen and pelvis as well as an upright frontal view of the chest are obtained. Cardiac silhouette is stable. No airspace disease, effusion, or pneumothorax. There is a dilated loop of distended gas-filled small bowel within the left upper quadrant, measuring up to 5.2 cm in diameter. Gas fluid level seen within the left upper quadrant. There is a paucity of distal small bowel gas and colonic gas. No free gas in the greater peritoneal sac. No masses or abnormal calcifications. IMPRESSION: 1.  Findings compatible with high-grade small bowel obstruction. 2. No acute intrathoracic process. Electronically Signed   By: Randa Ngo M.D.   On: 05/17/2020 16:24   DG Abd Portable 1V-Small Bowel Obstruction Protocol-initial, 8 hr delay  Result Date: 05/18/2020 CLINICAL DATA:  Small bowel obstruction EXAM: PORTABLE ABDOMEN - 1 VIEW COMPARISON:  05/17/2020 FINDINGS: Enteric tube has been placed with tip in the left upper quadrant. The proximal side hole projects over the EG junction region, possibly at or just below the EG junction. Advancement is suggested. Persistent gaseous distention of left upper quadrant bowel, similar to prior study. Residual contrast material in the bladder. IMPRESSION: Enteric tube tip is in the left upper quadrant with proximal side hole at or just below the EG junction. Advancement is suggested. Electronically Signed   By: Lucienne Capers M.D.   On: 05/18/2020 17:22   DG Abd Portable 1 View  Result Date: 05/17/2020 CLINICAL DATA:  Nasogastric tube placement EXAM: PORTABLE ABDOMEN - 1 VIEW COMPARISON:  05/17/2020 FINDINGS: Nasogastric tube extends into the upper abdomen with its tip overlying the expected proximal to mid body of the stomach. Visualized lung bases are clear. Dilated loops of gas-filled small bowel are seen within the  left upper quadrant suggestive of underlying small-bowel obstruction, however, the mid and inferior abdomen are excluded from view. No gross free intraperitoneal gas within the visualized abdomen. IMPRESSION: Nasogastric tube extends into the proximal to mid body of the stomach. Electronically Signed   By: Fidela Salisbury MD   On: 05/17/2020 23:03        Scheduled Meds: . enoxaparin (LOVENOX) injection  40 mg Subcutaneous Q24H   Continuous Infusions: . sodium chloride    . dextrose 5 % and 0.45 % NaCl with KCl 10 mEq/L 75 mL/hr at 05/19/20 1211     LOS: 2 days    Time spent: 60min  Domenic Polite, MD [facility]  05/19/2020, 1:22 PM

## 2020-05-19 NOTE — Plan of Care (Signed)
  Problem: Education: Goal: Knowledge of General Education information will improve Description: Including pain rating scale, medication(s)/side effects and non-pharmacologic comfort measures Outcome: Progressing   Problem: Health Behavior/Discharge Planning: Goal: Ability to manage health-related needs will improve Outcome: Progressing   Problem: Clinical Measurements: Goal: Will remain free from infection Outcome: Progressing Goal: Diagnostic test results will improve Outcome: Progressing   Problem: Activity: Goal: Risk for activity intolerance will decrease Outcome: Progressing   Problem: Education: Goal: Knowledge of risk factors and measures for prevention of condition will improve Outcome: Progressing   Problem: Respiratory: Goal: Will maintain a patent airway Outcome: Progressing   Problem: Nutrition: Goal: Adequate nutrition will be maintained Outcome: Not Progressing   Problem: Elimination: Goal: Will not experience complications related to bowel motility Outcome: Not Progressing

## 2020-05-19 NOTE — Progress Notes (Signed)
[facility] Progress Note     Subjective: CC-  Complaining of some right sided abdominal pain this morning. No flatus or BM. Denies n/v. NG tube output not recorded, about 200cc bilious drainage currently in cannister. Labs pending. VSS. No contrast visualized on follow up abdominal film yesterday, bowel dilatation about the same.  Objective: Vital signs in last 24 hours: Temp:  [98 F (36.7 C)-98.3 F (36.8 C)] 98 F (36.7 C) (12/30 0400) Pulse Rate:  [77-124] 97 (12/30 0400) Resp:  [15-26] 18 (12/30 0400) BP: (132-173)/(78-110) 156/78 (12/30 0400) SpO2:  [97 %-100 %] 99 % (12/30 0400) Weight:  [79.6 kg] 79.6 kg (12/30 0105) Last BM Date: 05/17/20  Intake/Output from previous day: 12/29 0701 - 12/30 0700 In: 804.6 [I.V.:606.3; IV Piggyback:108.3] Out: 500  Intake/Output this shift: No intake/output data recorded.  PE: Gen:  Alert, NAD, pleasant HEENT: EOM's intact, pupils equal and round Pulm:  rate and effort normal on room air Abd: Soft, mild distension, minimal right sided abdominal TTP without rebound or guarding, few BS heard, no HSM Skin: no rashes noted, warm and dry  Lab Results:  Recent Labs    05/17/20 1756 05/18/20 0319  WBC 6.5 10.1  HGB 15.1* 16.0*  HCT 45.5 47.0*  PLT 274 295   BMET Recent Labs    05/18/20 0319 05/19/20 0706  NA 136 137  K 3.5 4.2  CL 102 104  CO2 22 20*  GLUCOSE 191* 145*  BUN 8 12  CREATININE 0.87 0.86  CALCIUM 9.5 9.4   PT/INR No results for input(s): LABPROT, INR in the last 72 hours. CMP     Component Value Date/Time   NA 137 05/19/2020 0706   K 4.2 05/19/2020 0706   CL 104 05/19/2020 0706   CO2 20 (L) 05/19/2020 0706   GLUCOSE 145 (H) 05/19/2020 0706   BUN 12 05/19/2020 0706   CREATININE 0.86 05/19/2020 0706   CALCIUM 9.4 05/19/2020 0706   PROT 8.0 05/17/2020 1756   PROT 7.7 06/07/2017 0940   ALBUMIN 4.0 05/17/2020 1756   AST 28 05/17/2020 1756   ALT 15 05/17/2020 1756   ALKPHOS 91  05/17/2020 1756   BILITOT 0.5 05/17/2020 1756   GFRNONAA >60 05/19/2020 0706   GFRAA >60 08/15/2017 0012   Lipase     Component Value Date/Time   LIPASE 24 05/17/2020 1756       Studies/Results: CT CHEST W CONTRAST  Result Date: 05/17/2020 CLINICAL DATA:  May 17, 2020, abdominal CT. FINDINGS: Cardiovascular: Atheromatous plaque in the thoracic aorta. No aneurysmal dilation. Noncalcified plaque as well. Normal heart size. Three-vessel coronary artery disease. Central pulmonary vasculature unremarkable on venous phase assessment. Mediastinum/Nodes: Heterogeneous and enlarged thyroid gland, multinodular changes. No axillary lymphadenopathy. Enlarged peribronchial lymph node on image 69 of series 3 a 13 mm. Borderline enlarged LEFT infrahilar lymph node at 9 mm on image 71 of series 3. Subcarinal lymph node at 14 mm on image 61 of series 3. No additional mediastinal or hilar nodal enlargement. Esophagus mildly patulous. Lungs/Pleura: Pulmonary mass measuring 3.5 x 2.8 cm on image 94 of series 4 with spiculated margins contacting the visceral pleura in the LEFT lower lobe, situated in the medial aspect of the LEFT lower lobe. Direct extension of the mass versus is infra hilar lymph node on image 88 of series 4 measuring 13 mm. Bronchial narrowing of secondary bronchial structures at the level of the mass beneath the LEFT hilum. Airways are otherwise patent. Tiny nodule in  the RIGHT chest on image 75 series 4 approximately 2-3 mm. Upper Abdomen: Incidental imaging of upper abdominal contents again with small amount of perisplenic fluid and marked small bowel dilation. Visible adrenal glands are normal. There is excreted contrast within the kidneys partially visualized. Musculoskeletal: No acute musculoskeletal finding. No destructive bone lesion. Spinal degenerative changes. IMPRESSION: 1. LEFT lower lobe mass with LEFT hilar and mediastinal adenopathy most suggestive of bronchogenic carcinoma.  Referral to multi disciplinary thoracic oncologic setting with PET or biopsy for further assessment is suggested. 2. Tiny 2-3 mm nodule in the RIGHT chest on image 75 series 4. Attention on follow-up. 3. Signs of bowel obstruction and fluid in the upper abdomen about the spleen partially imaged. 4. Enlarged and heterogeneous thyroid. Recommend thyroid ultrasound (ref: J Am Coll Radiol. 2015 Feb;12(2): 143-50). 5. Three-vessel coronary artery disease. 6. Aortic atherosclerosis Aortic Atherosclerosis (ICD10-I70.0). Electronically Signed   By: Zetta Bills M.D.   On: 05/17/2020 21:21   CT ABDOMEN PELVIS W CONTRAST  Result Date: 05/17/2020 CLINICAL DATA:  Abdominal pain. Vomiting. Possible bowel obstruction on radiograph. EXAM: CT ABDOMEN AND PELVIS WITH CONTRAST TECHNIQUE: Multidetector CT imaging of the abdomen and pelvis was performed using the standard protocol following bolus administration of intravenous contrast. CONTRAST:  137mL OMNIPAQUE IOHEXOL 300 MG/ML  SOLN COMPARISON:  Radiographs earlier today.  CT 02/04/2017 FINDINGS: Lower chest: Lobulated 2.5 cm left lower lobe pulmonary nodule with irregular margins, increased in size from prior exam. No pleural fluid or acute airspace disease. Hepatobiliary: Mild hepatic steatosis. No focal hepatic lesion. Gallbladder physiologically distended, no calcified stone. No biliary dilatation. Pancreas: Parenchymal atrophy. No ductal dilatation or inflammation. Spleen: Normal in size without focal abnormality. Adrenals/Urinary Tract: Normal adrenal glands. No hydronephrosis or perinephric edema. Homogeneous renal enhancement with symmetric excretion on delayed phase imaging. Tiny hypodensity in the upper left kidney is too small to accurately characterize. Urinary bladder is physiologically distended without wall thickening. Slight cystocele. Stomach/Bowel: Fluid-filled distended gastric cardia. Dilated fluid-filled proximal small bowel. Transition from dilated to  nondilated in the mid central abdomen, series 4, image 51. More distal small bowel is decompressed. Mild mesenteric edema and small free fluid. No pneumatosis. The appendix is not confidently visualized on the current exam. Majority of the colon is decompressed. Occasional descending and sigmoid diverticula without diverticulitis. There is pelvic floor descent with small-bowel loops coursing posterior to the bladder. Vascular/Lymphatic: No acute vascular findings are evidence of vascular compromise. Aortic atherosclerosis. Patent portal vein. Patent mesenteric vessels. No enlarged lymph nodes in the abdomen or pelvis. Reproductive: Status post hysterectomy. No adnexal masses. Other: Small volume of non organized free fluid in both upper quadrants, scattered throughout the mesentery and in the pelvis. No free air or perforation. No focal fluid collection/abscess. Small fat containing upper ventral abdominal wall hernia. Musculoskeletal: Diffuse lumbar degenerative change. No acute osseous abnormality or focal osseous lesion. Scattered subcutaneous granuloma in both gluteal regions/flanks. IMPRESSION: 1. Small bowel obstruction with transition point in the mid central abdomen, likely due to adhesions. Small volume of non organized free fluid in the abdomen and pelvis. No perforation or evidence of ischemia. 2. Lobulated 2.5 cm left lower lobe pulmonary nodule with irregular margins, increased in size from prior exam. This is suspicious for primary bronchogenic malignancy. Recommend dedicated chest CT (with IV contrast) for complete evaluation of the thorax. 3. Mild hepatic steatosis. 4. Pelvic floor descent with small-bowel loops coursing posterior to the bladder. Aortic Atherosclerosis (ICD10-I70.0). Electronically Signed   By: Threasa Beards  Sanford M.D.   On: 05/17/2020 19:40   DG ABD ACUTE 2+V W 1V CHEST  Result Date: 05/17/2020 CLINICAL DATA:  Chronic upper abdominal pain EXAM: DG ABDOMEN ACUTE WITH 1 VIEW CHEST  COMPARISON:  08/15/2017 FINDINGS: Supine and upright frontal views of the abdomen and pelvis as well as an upright frontal view of the chest are obtained. Cardiac silhouette is stable. No airspace disease, effusion, or pneumothorax. There is a dilated loop of distended gas-filled small bowel within the left upper quadrant, measuring up to 5.2 cm in diameter. Gas fluid level seen within the left upper quadrant. There is a paucity of distal small bowel gas and colonic gas. No free gas in the greater peritoneal sac. No masses or abnormal calcifications. IMPRESSION: 1. Findings compatible with high-grade small bowel obstruction. 2. No acute intrathoracic process. Electronically Signed   By: Randa Ngo M.D.   On: 05/17/2020 16:24   DG Abd Portable 1V-Small Bowel Obstruction Protocol-initial, 8 hr delay  Result Date: 05/18/2020 CLINICAL DATA:  Small bowel obstruction EXAM: PORTABLE ABDOMEN - 1 VIEW COMPARISON:  05/17/2020 FINDINGS: Enteric tube has been placed with tip in the left upper quadrant. The proximal side hole projects over the EG junction region, possibly at or just below the EG junction. Advancement is suggested. Persistent gaseous distention of left upper quadrant bowel, similar to prior study. Residual contrast material in the bladder. IMPRESSION: Enteric tube tip is in the left upper quadrant with proximal side hole at or just below the EG junction. Advancement is suggested. Electronically Signed   By: Lucienne Capers M.D.   On: 05/18/2020 17:22   DG Abd Portable 1 View  Result Date: 05/17/2020 CLINICAL DATA:  Nasogastric tube placement EXAM: PORTABLE ABDOMEN - 1 VIEW COMPARISON:  05/17/2020 FINDINGS: Nasogastric tube extends into the upper abdomen with its tip overlying the expected proximal to mid body of the stomach. Visualized lung bases are clear. Dilated loops of gas-filled small bowel are seen within the left upper quadrant suggestive of underlying small-bowel obstruction, however, the  mid and inferior abdomen are excluded from view. No gross free intraperitoneal gas within the visualized abdomen. IMPRESSION: Nasogastric tube extends into the proximal to mid body of the stomach. Electronically Signed   By: Fidela Salisbury MD   On: 05/17/2020 23:03    Anti-infectives: Anti-infectives (From admission, onward)   None       Assessment/Plan HTN HLD GERD Asthma LLL lung mass - noted on CT, concerning for primary bronchogenic malignancy COVD+  SBO - PSH: tubal ligation, hysterectomy, ex lap LOA 03/2015 Dr. Rosendo Gros for SBO - CT 12/28 shows SBO with transition point in the mid central abdomen, likely due to adhesions; small volume of non organized free fluid in the abdomen and pelvis; no perforation or evidence of ischemia  ID - none FEN - IVF, NPO/NGT to LIWS VTE - SCDs, lovenox Foley - none Follow up - TBD  Plan: Labs pending. Advance NG tube 4-5cm. Repeat small bowel protocol today.  I called and updated the patient's daughter.   LOS: 2 days    [name], [facility] 05/19/2020, 8:41 AM Please see Amion for pager number during day hours 7:00am-4:30pm

## 2020-05-20 DIAGNOSIS — K56609 Unspecified intestinal obstruction, unspecified as to partial versus complete obstruction: Secondary | ICD-10-CM | POA: Diagnosis not present

## 2020-05-20 LAB — BASIC METABOLIC PANEL
Anion gap: 12 (ref 5–15)
BUN: 14 mg/dL (ref 8–23)
CO2: 20 mmol/L — ABNORMAL LOW (ref 22–32)
Calcium: 9 mg/dL (ref 8.9–10.3)
Chloride: 107 mmol/L (ref 98–111)
Creatinine, Ser: 0.92 mg/dL (ref 0.44–1.00)
GFR, Estimated: >60 mL/min (ref >60)
Glucose, Bld: 112 mg/dL — ABNORMAL HIGH (ref 70–99)
Potassium: 3.9 mmol/L (ref 3.5–5.1)
Sodium: 139 mmol/L (ref 135–145)

## 2020-05-20 LAB — FERRITIN: Ferritin: 136 ng/mL (ref 11–307)

## 2020-05-20 LAB — D-DIMER, QUANTITATIVE: D-Dimer, Quant: 1.89 ug/mL-FEU — ABNORMAL HIGH (ref 0.00–0.50)

## 2020-05-20 LAB — CBC
HCT: 46.1 % — ABNORMAL HIGH (ref 36.0–46.0)
Hemoglobin: 15.8 g/dL — ABNORMAL HIGH (ref 12.0–15.0)
MCH: 30.4 pg (ref 26.0–34.0)
MCHC: 34.3 g/dL (ref 30.0–36.0)
MCV: 88.7 fL (ref 80.0–100.0)
Platelets: 219 10*3/uL (ref 150–400)
RBC: 5.2 MIL/uL — ABNORMAL HIGH (ref 3.87–5.11)
RDW: 13.7 % (ref 11.5–15.5)
WBC: 8.4 10*3/uL (ref 4.0–10.5)
nRBC: 0 % (ref 0.0–0.2)

## 2020-05-20 LAB — C-REACTIVE PROTEIN: CRP: 0.7 mg/dL (ref <1.0)

## 2020-05-20 NOTE — Progress Notes (Signed)
PROGRESS NOTE    Angelica [name]  HGD:[id number] DOB: 1940-10-08 DOA: 05/17/2020 PCP: [name], MD  Brief Narrative:Angelica [name] is a 79 y.o. female with medical history significant for hypertension and thyroid disease, SBO in 2016, presented to the emergency department 12/20 with abdominal pain, nausea, and vomiting. -Work-up in the ED noted incidental diagnosis of Covid and a CT abdomen pelvis which was concerning for small bowel obstruction with transition in the mid central abdomen likely from adhesions in addition CT chest noted left lower lobe lung mass   Assessment & Plan:   SBO  -This is likely secondary to adhesions from prior surgery, general surgery following -Continue IV fluids, NG decompression -Continues to have persistent obstruction -Required ex lap and lysis of adhesions back in 2016 -Per general surgery  Left lower lobe lung mass  -With mediastinal and hilar lymphadenopathy concerning for primary bronchogenic carcinoma -Will request pulmonary evaluation once SBO has resolved for biopsy  SARS COVID-19 infection -Asymptomatic, not hypoxic, she is vaccinated -Given monoclonal antibody infusion 12/29   Asthma  - No wheezing, continue albuterol PRN  Hypertension  - Stable  DVT prophylaxis: Lovenox Code Status: Full code Family Communication: Called and updated daughter 12/29, will reattempt later today Disposition Plan:  Status is: Inpatient  Remains inpatient appropriate because:Inpatient level of care appropriate due to severity of illness   Dispo: The patient is from: Home              Anticipated d/c is to: Home              Anticipated d/c date is: > 3 days              Patient currently is not medically stable to d/c.   Consultants:  General surgery  Procedures:   Antimicrobials:    Subjective: -Continues to be very uncomfortable, complains of abdominal discomfort, denies any flatus or bowel movement Objective: Vitals:    05/19/20 1610 05/19/20 2012 05/20/20 0417 05/20/20 0755  BP: (!) 148/88 (!) 156/96 (!) 155/91 (!) 152/89  Pulse: 91 93 94 93  Resp: 16 18 18    Temp: 98.1 F (36.7 C) 98.5 F (36.9 C) 98.1 F (36.7 C) (!) 97.5 F (36.4 C)  TempSrc: Oral Oral Oral Oral  SpO2: 98% 97% 99% 98%  Weight:      Height:        Intake/Output Summary (Last 24 hours) at 05/20/2020 1226 Last data filed at 05/20/2020 1000 Gross per 24 hour  Intake 996.25 ml  Output 1925 ml  Net -928.75 ml   Filed Weights   05/19/20 0105  Weight: 79.6 kg    Examination:  General exam: Chronically ill elderly female sitting up in bed, awake alert oriented x2, uncomfortable appearing HEENT: NG tube to intermittent suction CVS: S1-S2, regular rate rhythm Lungs: Decreased breath sounds at the bases Abdomen: Soft, less distended, bowel sounds diminished Extremities: No edema  Skin: No rashes on exposed skin Psychiatry:  Mood & affect appropriate.     Data Reviewed:   CBC: Recent Labs  Lab 05/17/20 1756 05/18/20 0319 05/19/20 0915 05/20/20 0040  WBC 6.5 10.1 7.3 8.4  HGB 15.1* 16.0* 16.3* 15.8*  HCT 45.5 47.0* 46.9* 46.1*  MCV 89.9 89.5 88.2 88.7  PLT 274 295 293 962   Basic Metabolic Panel: Recent Labs  Lab 05/17/20 1756 05/18/20 0319 05/19/20 0706 05/20/20 0040  NA 135 136 137 139  K 3.7 3.5 4.2 3.9  CL 100 102  104 107  CO2 22 22 20* 20*  GLUCOSE 141* 191* 145* 112*  BUN 10 8 12 14   CREATININE 0.79 0.87 0.86 0.92  CALCIUM 9.8 9.5 9.4 9.0   GFR: Estimated Creatinine Clearance: 50.8 mL/min (by C-G formula based on SCr of 0.92 mg/dL). Liver Function Tests: Recent Labs  Lab 05/17/20 1756  AST 28  ALT 15  ALKPHOS 91  BILITOT 0.5  PROT 8.0  ALBUMIN 4.0   Recent Labs  Lab 05/17/20 1756  LIPASE 24   No results for input(s): AMMONIA in the last 168 hours. Coagulation Profile: No results for input(s): INR, PROTIME in the last 168 hours. Cardiac Enzymes: No results for input(s): CKTOTAL,  CKMB, CKMBINDEX, TROPONINI in the last 168 hours. BNP (last 3 results) No results for input(s): PROBNP in the last 8760 hours. HbA1C: No results for input(s): HGBA1C in the last 72 hours. CBG: No results for input(s): GLUCAP in the last 168 hours. Lipid Profile: No results for input(s): CHOL, HDL, LDLCALC, TRIG, CHOLHDL, LDLDIRECT in the last 72 hours. Thyroid Function Tests: No results for input(s): TSH, T4TOTAL, FREET4, T3FREE, THYROIDAB in the last 72 hours. Anemia Panel: Recent Labs    05/19/20 0706 05/20/20 0040  FERRITIN 150 136   Urine analysis:    Component Value Date/Time   COLORURINE YELLOW 05/03/2020 1707   APPEARANCEUR CLEAR 05/03/2020 1707   LABSPEC 1.020 05/03/2020 1707   PHURINE 6.0 05/03/2020 1707   GLUCOSEU NEGATIVE 05/03/2020 1707   HGBUR NEGATIVE 05/03/2020 1707   BILIRUBINUR NEGATIVE 05/03/2020 1707   KETONESUR NEGATIVE 05/03/2020 1707   PROTEINUR NEGATIVE 08/15/2017 0045   UROBILINOGEN 0.2 05/03/2020 1707   NITRITE NEGATIVE 05/03/2020 1707   LEUKOCYTESUR NEGATIVE 05/03/2020 1707   Sepsis Labs: @LABRCNTIP (procalcitonin:4,lacticidven:4)  ) Recent Results (from the past 240 hour(s))  Resp Panel by RT-PCR (Flu A&B, Covid) Nasopharyngeal Swab     Status: Abnormal   Collection Time: 05/17/20  8:48 PM   Specimen: Nasopharyngeal Swab; Nasopharyngeal(NP) swabs in vial transport medium  Result Value Ref Range Status   SARS Coronavirus 2 by RT PCR POSITIVE (A) NEGATIVE Final    Comment: RESULT CALLED TO, READ BACK BY AND VERIFIED WITH: J NEWTON RN 05/17/20 2213 JDW (NOTE) SARS-CoV-2 target nucleic acids are DETECTED.  The SARS-CoV-2 RNA is generally detectable in upper respiratory specimens during the acute phase of infection. Positive results are indicative of the presence of the identified virus, but do not rule out bacterial infection or co-infection with other pathogens not detected by the test. Clinical correlation with patient history and other  diagnostic information is necessary to determine patient infection status. The expected result is Negative.  Fact Sheet for Patients: EntrepreneurPulse.com.au  Fact Sheet for Healthcare Providers: IncredibleEmployment.be  This test is not yet approved or cleared by the [place] FDA and  has been authorized for detection and/or diagnosis of SARS-CoV-2 by FDA under an Emergency Use Authorization (EUA).  This EUA will remain in effect (meaning this test can be use d) for the duration of  the COVID-19 declaration under Section 564(b)(1) of the Act, 21 U.S.C. section 360bbb-3(b)(1), unless the authorization is terminated or revoked sooner.     Influenza A by PCR NEGATIVE NEGATIVE Final   Influenza B by PCR NEGATIVE NEGATIVE Final    Comment: (NOTE) The Xpert Xpress SARS-CoV-2/FLU/RSV plus assay is intended as an aid in the diagnosis of influenza from Nasopharyngeal swab specimens and should not be used as a sole basis for treatment. Nasal washings and  aspirates are unacceptable for Xpert Xpress SARS-CoV-2/FLU/RSV testing.  Fact Sheet for Patients: EntrepreneurPulse.com.au  Fact Sheet for Healthcare Providers: IncredibleEmployment.be  This test is not yet approved or cleared by the [place] FDA and has been authorized for detection and/or diagnosis of SARS-CoV-2 by FDA under an Emergency Use Authorization (EUA). This EUA will remain in effect (meaning this test can be used) for the duration of the COVID-19 declaration under Section 564(b)(1) of the Act, 21 U.S.C. section 360bbb-3(b)(1), unless the authorization is terminated or revoked.  Performed at [facility], [address] [place], [place] [address]          Radiology Studies: DG Abd Portable 1V-Small Bowel Obstruction Protocol-initial, 8 hr delay  Result Date: 05/19/2020 CLINICAL DATA:  Small-bowel obstruction EXAM: PORTABLE  ABDOMEN - 1 VIEW COMPARISON:  05/18/2020 FINDINGS: Two supine frontal views of the abdomen and pelvis are obtained. Enteric catheter tip and side port project over the gastric fundus. There is excreted contrast within the bladder related to previous CT. Oral contrast is seen within the stomach and duodenal sweep. Dilute contrast is seen filling dilated loops of small bowel within the left mid abdomen. No evidence of distal small bowel or colonic contrast. IMPRESSION: 1. No evidence of colonic contrast. Persistent dilated small bowel consistent with small-bowel obstruction. Electronically Signed   By: Randa Ngo M.D.   On: 05/19/2020 20:18   DG Abd Portable 1V-Small Bowel Obstruction Protocol-initial, 8 hr delay  Result Date: 05/18/2020 CLINICAL DATA:  Small bowel obstruction EXAM: PORTABLE ABDOMEN - 1 VIEW COMPARISON:  05/17/2020 FINDINGS: Enteric tube has been placed with tip in the left upper quadrant. The proximal side hole projects over the EG junction region, possibly at or just below the EG junction. Advancement is suggested. Persistent gaseous distention of left upper quadrant bowel, similar to prior study. Residual contrast material in the bladder. IMPRESSION: Enteric tube tip is in the left upper quadrant with proximal side hole at or just below the EG junction. Advancement is suggested. Electronically Signed   By: Lucienne Capers M.D.   On: 05/18/2020 17:22        Scheduled Meds: . enoxaparin (LOVENOX) injection  40 mg Subcutaneous Q24H   Continuous Infusions: . sodium chloride    . sodium chloride 75 mL/hr at 05/20/20 0610     LOS: 3 days    Time spent: 35min  Domenic Polite, MD [facility]  05/20/2020, 12:26 PM

## 2020-05-20 NOTE — Plan of Care (Signed)
  Problem: Education: Goal: Knowledge of General Education information will improve Description: Including pain rating scale, medication(s)/side effects and non-pharmacologic comfort measures Outcome: Progressing   Problem: Clinical Measurements: Goal: Will remain free from infection Outcome: Progressing Goal: Diagnostic test results will improve Outcome: Progressing   Problem: Activity: Goal: Risk for activity intolerance will decrease Outcome: Progressing   

## 2020-05-20 NOTE — Progress Notes (Signed)
[facility] Progress Note     Subjective: Continued nausea, NG tube decompressing gastric contents.  Some mild to moderate abdominal pain.  Objective: Vital signs in last 24 hours: Temp:  [97.5 F (36.4 C)-98.7 F (37.1 C)] 97.5 F (36.4 C) (12/31 0755) Pulse Rate:  [90-94] 93 (12/31 0755) Resp:  [16-18] 18 (12/31 0417) BP: (129-156)/(82-96) 152/89 (12/31 0755) SpO2:  [97 %-99 %] 98 % (12/31 0755) Last BM Date: 05/17/20  Intake/Output from previous day: 12/30 0701 - 12/31 0700 In: 996.3 [I.V.:996.3] Out: 1425 [Urine:650; Emesis/NG output:775] Intake/Output this shift: Total I/O In: -  Out: 500 [Emesis/NG output:500]  PE: Gen:  Alert, NAD, pleasant HEENT: EOM's intact, pupils equal and round Pulm:  rate and effort normal on room air Abd: Soft, mild distension, minimal right sided abdominal TTP without rebound or guarding,  Skin: no rashes noted, warm and dry  Lab Results:  Recent Labs    05/19/20 0915 05/20/20 0040  WBC 7.3 8.4  HGB 16.3* 15.8*  HCT 46.9* 46.1*  PLT 293 219   BMET Recent Labs    05/19/20 0706 05/20/20 0040  NA 137 139  K 4.2 3.9  CL 104 107  CO2 20* 20*  GLUCOSE 145* 112*  BUN 12 14  CREATININE 0.86 0.92  CALCIUM 9.4 9.0   PT/INR No results for input(s): LABPROT, INR in the last 72 hours. CMP     Component Value Date/Time   NA 139 05/20/2020 0040   K 3.9 05/20/2020 0040   CL 107 05/20/2020 0040   CO2 20 (L) 05/20/2020 0040   GLUCOSE 112 (H) 05/20/2020 0040   BUN 14 05/20/2020 0040   CREATININE 0.92 05/20/2020 0040   CALCIUM 9.0 05/20/2020 0040   PROT 8.0 05/17/2020 1756   PROT 7.7 06/07/2017 0940   ALBUMIN 4.0 05/17/2020 1756   AST 28 05/17/2020 1756   ALT 15 05/17/2020 1756   ALKPHOS 91 05/17/2020 1756   BILITOT 0.5 05/17/2020 1756   GFRNONAA >60 05/20/2020 0040   GFRAA >60 08/15/2017 0012   Lipase     Component Value Date/Time   LIPASE 24 05/17/2020 1756       Studies/Results: DG Abd Portable  1V-Small Bowel Obstruction Protocol-initial, 8 hr delay  Result Date: 05/19/2020 CLINICAL DATA:  Small-bowel obstruction EXAM: PORTABLE ABDOMEN - 1 VIEW COMPARISON:  05/18/2020 FINDINGS: Two supine frontal views of the abdomen and pelvis are obtained. Enteric catheter tip and side port project over the gastric fundus. There is excreted contrast within the bladder related to previous CT. Oral contrast is seen within the stomach and duodenal sweep. Dilute contrast is seen filling dilated loops of small bowel within the left mid abdomen. No evidence of distal small bowel or colonic contrast. IMPRESSION: 1. No evidence of colonic contrast. Persistent dilated small bowel consistent with small-bowel obstruction. Electronically Signed   By: Randa Ngo M.D.   On: 05/19/2020 20:18   DG Abd Portable 1V-Small Bowel Obstruction Protocol-initial, 8 hr delay  Result Date: 05/18/2020 CLINICAL DATA:  Small bowel obstruction EXAM: PORTABLE ABDOMEN - 1 VIEW COMPARISON:  05/17/2020 FINDINGS: Enteric tube has been placed with tip in the left upper quadrant. The proximal side hole projects over the EG junction region, possibly at or just below the EG junction. Advancement is suggested. Persistent gaseous distention of left upper quadrant bowel, similar to prior study. Residual contrast material in the bladder. IMPRESSION: Enteric tube tip is in the left upper quadrant with proximal side hole at or just  below the EG junction. Advancement is suggested. Electronically Signed   By: Lucienne Capers M.D.   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 Rosendo Gros for SBO - CT 12/28 shows SBO with transition point in the mid central abdomen, likely due to adhesions; small volume of non  organized free fluid in the abdomen and pelvis; no perforation or evidence of ischemia  ID - none FEN - IVF, NPO/NGT to LIWS VTE - SCDs, lovenox Foley - none Follow up - TBD  Plan: No progression of contrast on small bowel series yesterday concerning for ongoing obstruction.  However the patient's abdominal exam is relatively benign and white blood cell count is normal.  We will continue to observe for return of bowel function with NG tube decompression alone and hold off on any surgical intervention at this time, specially in light of her COVID-19 diagnosis.    LOS: 3 days    [facility] 05/20/2020, 12:16 PM Please see Amion for pager number during day hours 7:00am-4:30pm

## 2020-05-21 ENCOUNTER — Inpatient Hospital Stay (HOSPITAL_COMMUNITY): Payer: Medicare HMO

## 2020-05-21 DIAGNOSIS — K56609 Unspecified intestinal obstruction, unspecified as to partial versus complete obstruction: Secondary | ICD-10-CM | POA: Diagnosis not present

## 2020-05-21 LAB — D-DIMER, QUANTITATIVE: D-Dimer, Quant: 2.4 ug/mL-FEU — ABNORMAL HIGH (ref 0.00–0.50)

## 2020-05-21 IMAGING — DX DG ABDOMEN 1V
1 series · 2 of 2 positions shown · non-contrast
Comparison: CT Abdomen and Pelvis [DATE]. Postcontrast
abdominal radiographs [DATE].

CLINICAL DATA: 79-year-old female with small bowel obstruction.

EXAM:
ABDOMEN - 1 VIEW

[Series 1: abdomen · 0.14mm/px · 2 of 2 slices shown]
[im 1/2]
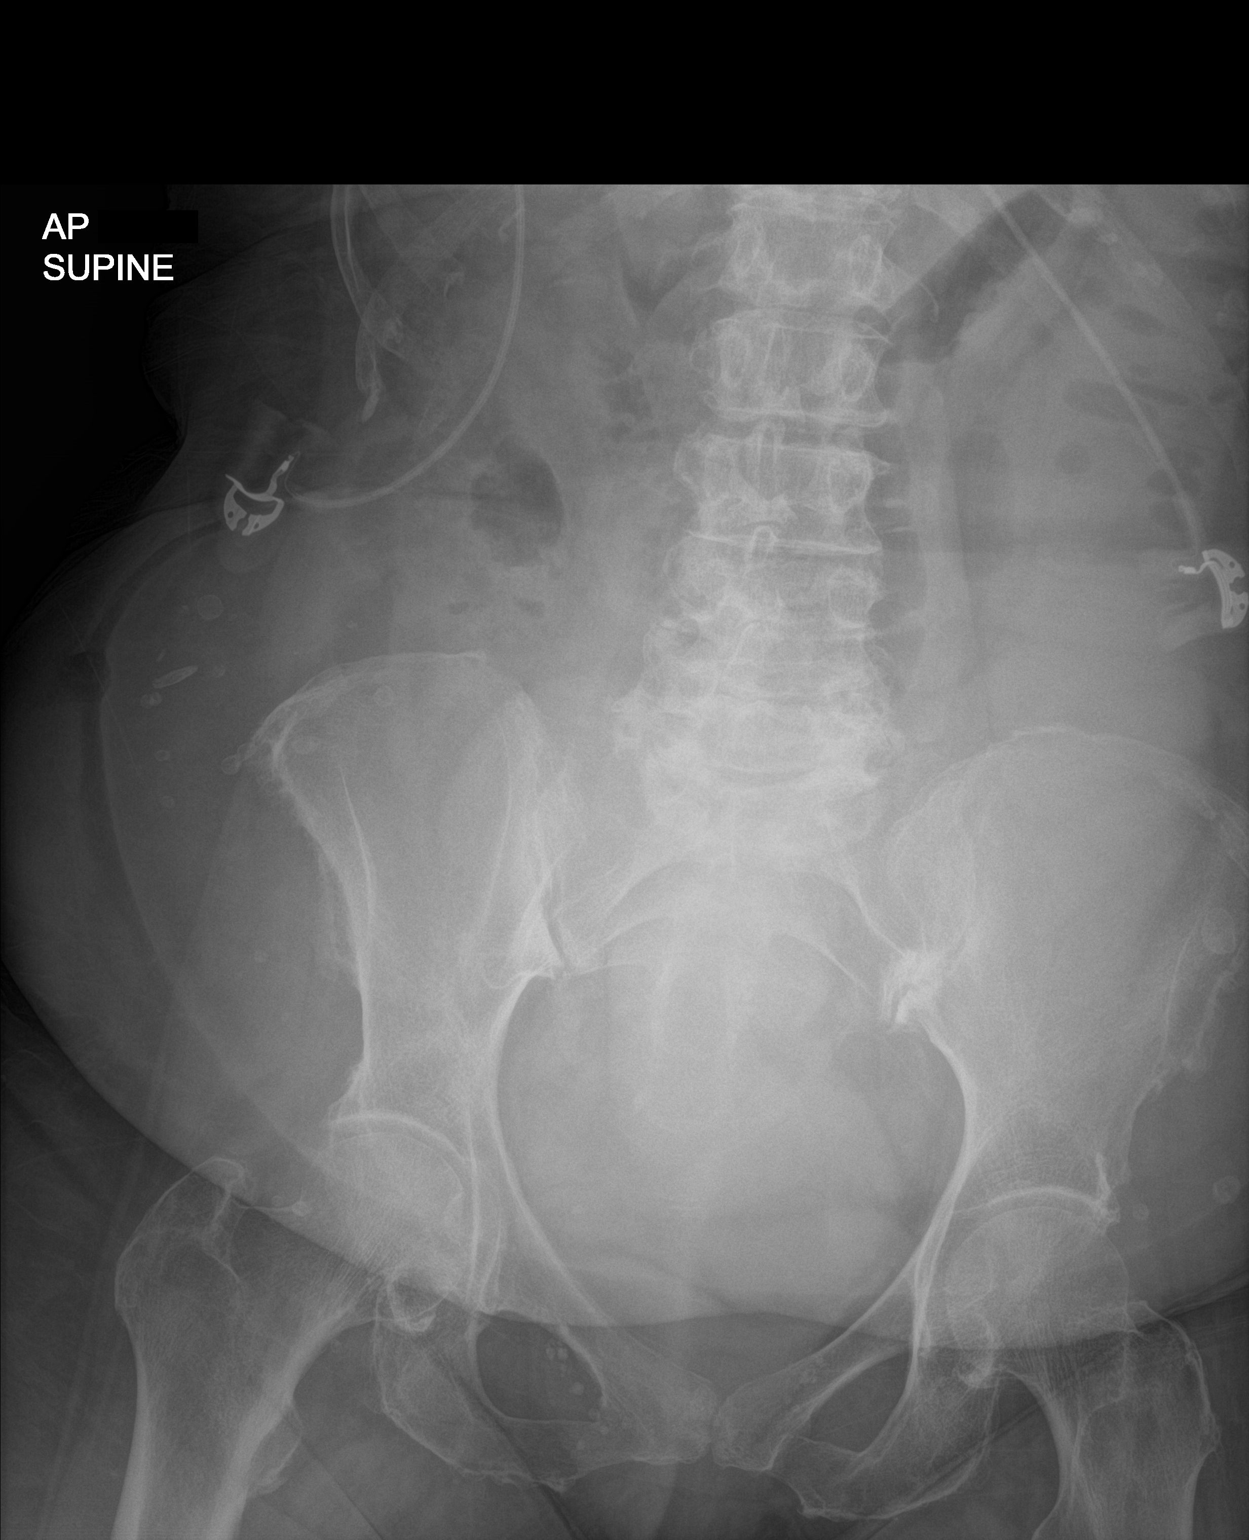
[im 2/2]
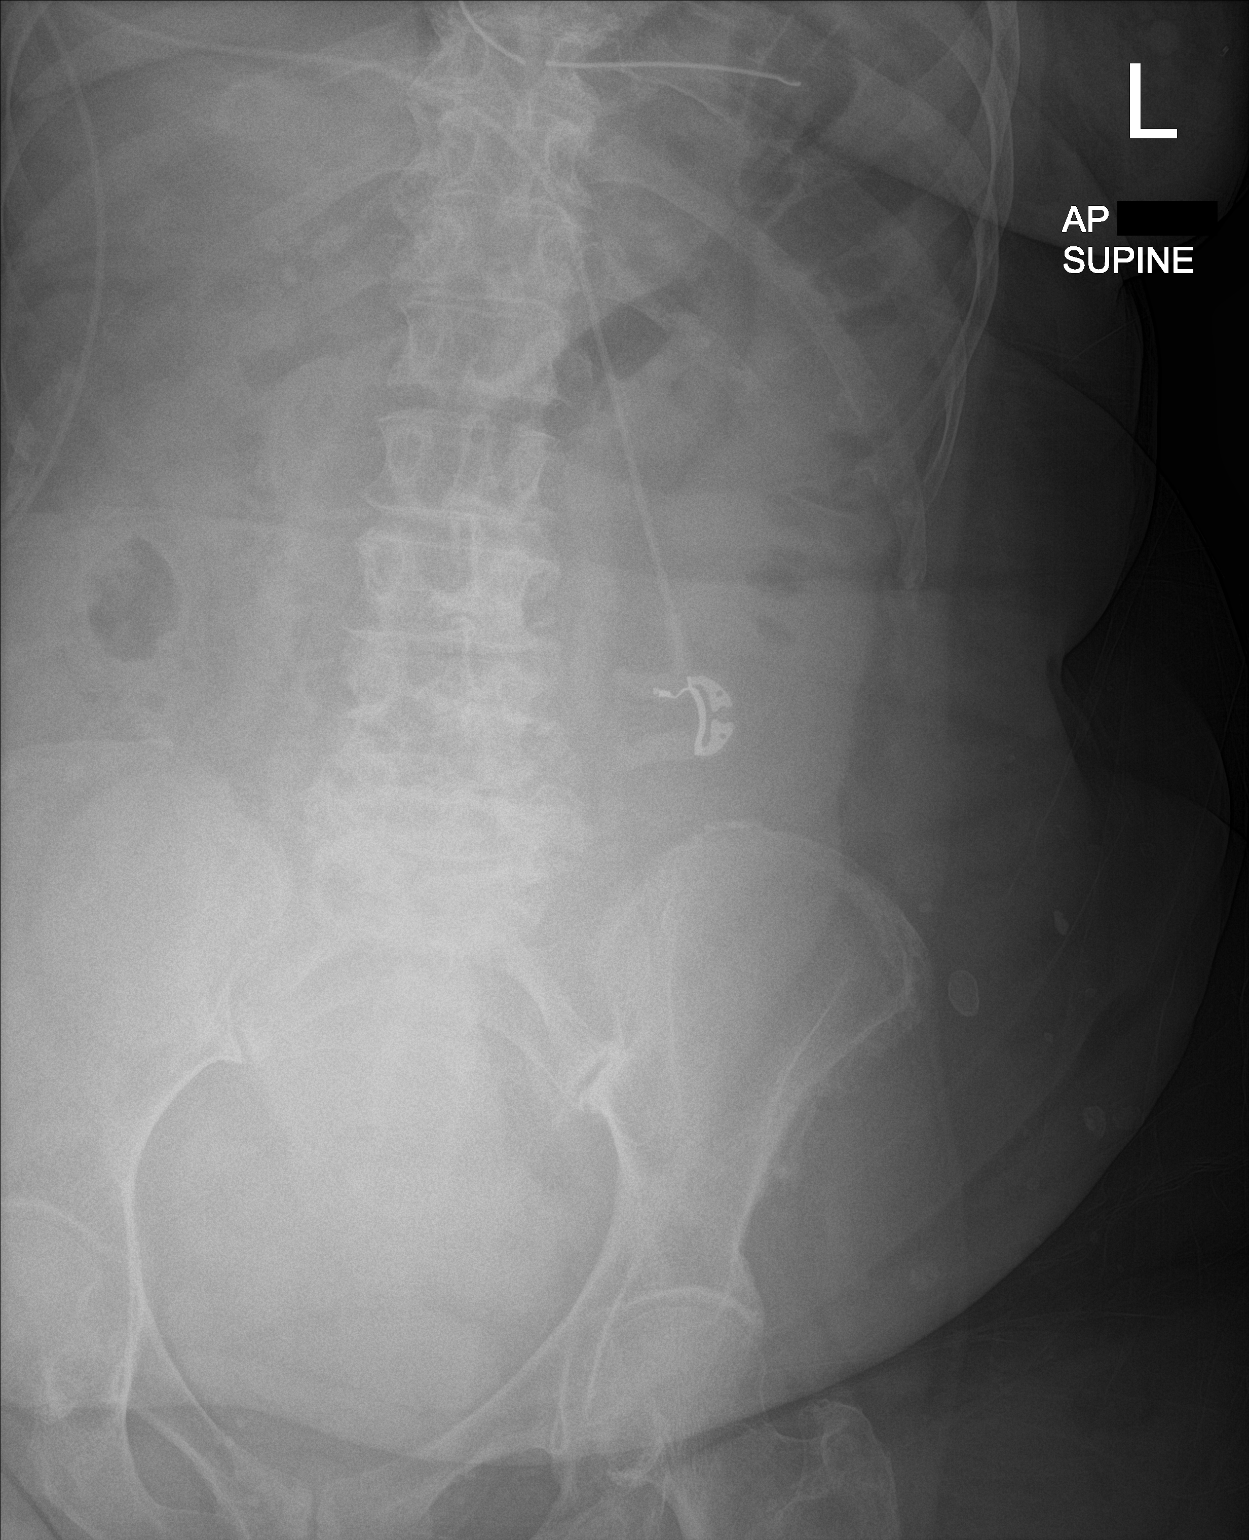

[2 of 2 positions shown; findings below may reference images not displayed]

FINDINGS: Portable AP supine views since [DATE] the oral contrast has
become dilute throughout the abdomen and pelvis. Still, faint
contrast is visible within a dilated left abdominal loop seen
previously (arrow). And there is no convincing large bowel contrast.
Stable enteric tube visible in the epigastrium. Stable abdominal and
pelvic visceral contours. Stable visualized osseous structures. At
[ND] hours.
IMPRESSION: 1. Persistent small bowel obstruction, with no evidence of
improvement since [DATE].
[DATE]. Stable enteric tube.

## 2020-05-21 MED ORDER — MORPHINE SULFATE (PF) 2 MG/ML IV SOLN
1.0000 mg | INTRAVENOUS | Status: DC | PRN
  Administered 2020-05-21 – 2020-05-22 (×3): 1 mg via INTRAVENOUS
  Filled 2020-05-21 (×3): qty 1

## 2020-05-21 NOTE — Progress Notes (Addendum)
    CC: Abdominal pain, nausea and vomiting  Subjective: Patient still looks fairly miserable.  NG was not working well and I irrigated it with air and readjusted the suction.  She put out about 200 of dark green fluid with just this manipulation.  It is fairly thick.  No flatus or BM so far. Objective: Vital signs in last 24 hours: Temp:  [97.7 F (36.5 C)-98.6 F (37 C)] 98.2 F (36.8 C) (01/01 0740) Pulse Rate:  [87-101] 94 (01/01 0740) Resp:  [18] 18 (01/01 0500) BP: (135-157)/(86-97) 146/86 (01/01 0740) SpO2:  [97 %-100 %] 98 % (01/01 0740) Last BM Date: 05/17/20 1725 IV 400 urine 1200 NG Afebrile, vital signs are stable blood pressure slightly elevated CMP/CBC is stable Last film 12/30 no evidence of contrast in the colon system small bowel dilatation. Intake/Output from previous day: 12/31 0701 - 01/01 0700 In: 1725 [I.V.:1725] Out: 1600 [Urine:400; Emesis/NG output:1200] Intake/Output this shift: No intake/output data recorded.  General appearance: alert, cooperative, no distress and Looks utterly miserable. Resp: clear to auscultation bilaterally GI: Soft she has some hyperactive bowel sounds.  No flatus or BM.  Dark green fluid coming from the NG tube.  Lab Results:  Recent Labs    05/19/20 0915 05/20/20 0040  WBC 7.3 8.4  HGB 16.3* 15.8*  HCT 46.9* 46.1*  PLT 293 219    BMET Recent Labs    05/19/20 0706 05/20/20 0040  NA 137 139  K 4.2 3.9  CL 104 107  CO2 20* 20*  GLUCOSE 145* 112*  BUN 12 14  CREATININE 0.86 0.92  CALCIUM 9.4 9.0   PT/INR No results for input(s): LABPROT, INR in the last 72 hours.  Recent Labs  Lab 05/17/20 1756  AST 28  ALT 15  ALKPHOS 91  BILITOT 0.5  PROT 8.0  ALBUMIN 4.0     Lipase     Component Value Date/Time   LIPASE 24 05/17/2020 1756     Medications: . enoxaparin (LOVENOX) injection  40 mg Subcutaneous Q24H    Assessment/Plan HTN HLD GERD Asthma LLL lung mass - noted on CT, concerning for  primary bronchogenic malignancy Enlarged thyroid COVD Positive Malnourished/deconditioned  Current SBO Hx chronic abdominal pain -PSH: tubal ligation, hysterectomy,  -Hx exploratory laparotomy, lysis of adhesions, 04/18/2015, DR. Rosendo Gros - CT 12/28 shows [place] transition point in the mid central abdomen, likely due to adhesions; small volume of non organized free fluid in the abdomen and pelvis; no perforation or evidence of ischemia  ID -none FEN -IVF, NPO/NGT to LIWS VTE -SCDs, lovenox Foley -none Follow up -TBD  Plan: I have ordered a film is not done yet.  Continue NG suction, bowel rest for now.  She did not open up for 8 days during her last admission which led to surgery on 04/18/2015.  Currently she appears very deconditioned and probably malnourished.  .  We will add a prealbumin to her a.m. labs tomorrow.  We might want to consider a PICC line in TNA on her sooner than later.  Her D-dimer is going up. She is clearly in no condition to go for surgery.  I am also asking the staff to irrigate the NG tube every 6 to be sure it remains patent and functional.     LOS: 4 days    Angelica [name] 05/21/2020 Please see Amion

## 2020-05-21 NOTE — Progress Notes (Addendum)
PROGRESS NOTE    TORINA EY  BZJ:[id number] DOB: [birth year] DOA: 05/17/2020 PCP: [name], MD  Brief Narrative:Kory WINNELL BENTO is a 80 y.o. female with medical history significant for hypertension and thyroid disease, SBO in 2016, presented to the emergency department 12/20 with abdominal pain, nausea, and vomiting. -Work-up in the ED noted incidental diagnosis of Covid and a CT abdomen pelvis which was concerning for small bowel obstruction with transition in the mid central abdomen likely from adhesions in addition CT chest noted left lower lobe lung mass. -General surgery following, failed small bowel protocol -Remains asymptomatic from [place]:   Persistent SBO  -secondary to adhesions from prior surgery, CT noted transition point in the mid central abdomen, general surgery following -Started conservative management with bowel rest, IV fluids and NG decompression -No improvement thus far-day 5 today -Required ex lap and lysis of adhesions back in 2016 -Per general surgery  Left lower lobe lung mass  -Incidentally noted on imaging, day of admission -With mediastinal and hilar lymphadenopathy concerning for primary bronchogenic carcinoma -Will request pulmonary evaluation once SBO has resolved for biopsy  SARS COVID-19 infection -Asymptomatic, not hypoxic, chest x-ray clear, she is vaccinated -Given monoclonal antibody infusion on 12/29   Asthma  - No wheezing, continue albuterol PRN  Hypertension  - Stable  DVT prophylaxis: Lovenox Code Status: Full code Family Communication: called and updated daughter [name] again today Disposition Plan:  Status is: Inpatient  Remains inpatient appropriate because:Inpatient level of care appropriate due to severity of illness   Dispo: The patient is from: Home              Anticipated d/c is to: Home              Anticipated d/c date is: > 3 days              Patient currently is not medically stable to  d/c.   Consultants:  General surgery   Procedures:   Antimicrobials:    Subjective: -Continues to feel uncomfortable, some abdominal discomfort and nausea, denies any cough or shortness of breath Objective: Vitals:   05/20/20 1614 05/20/20 2011 05/21/20 0500 05/21/20 0740  BP: (!) 157/97 (!) 155/89 (!) 151/92 (!) 146/86  Pulse: 98 87 91 94  Resp:  18 18   Temp: 98.4 F (36.9 C) 98.6 F (37 C) 98.4 F (36.9 C) 98.2 F (36.8 C)  TempSrc: Oral Axillary Axillary Oral  SpO2: 99% 100% 99% 98%  Weight:      Height:        Intake/Output Summary (Last 24 hours) at 05/21/2020 1251 Last data filed at 05/21/2020 0600 Gross per 24 hour  Intake 1725 ml  Output 1100 ml  Net 625 ml   Filed Weights   05/19/20 0105  Weight: 79.6 kg    Examination:  General exam: Chronically ill elderly female laying in bed, uncomfortable appearing, AAOx3 HEENT: NG tube with greenish output CVS: S1-S2, regular rate rhythm Lungs: Decreased breath sounds the bases Abdomen: Soft, obese, less distended, bowel sounds decreased  Extremities: No edema  Skin: No rashes on exposed skin Psychiatry: Flat affect    Data Reviewed:   CBC: Recent Labs  Lab 05/17/20 1756 05/18/20 0319 05/19/20 0915 05/20/20 0040  WBC 6.5 10.1 7.3 8.4  HGB 15.1* 16.0* 16.3* 15.8*  HCT 45.5 47.0* 46.9* 46.1*  MCV 89.9 89.5 88.2 88.7  PLT 274 295 293 017   Basic Metabolic Panel: Recent  Labs  Lab 05/17/20 1756 05/18/20 0319 05/19/20 0706 05/20/20 0040  NA 135 136 137 139  K 3.7 3.5 4.2 3.9  CL 100 102 104 107  CO2 22 22 20* 20*  GLUCOSE 141* 191* 145* 112*  BUN 10 8 12 14   CREATININE 0.79 0.87 0.86 0.92  CALCIUM 9.8 9.5 9.4 9.0   GFR: Estimated Creatinine Clearance: 50.8 mL/min (by C-G formula based on SCr of 0.92 mg/dL). Liver Function Tests: Recent Labs  Lab 05/17/20 1756  AST 28  ALT 15  ALKPHOS 91  BILITOT 0.5  PROT 8.0  ALBUMIN 4.0   Recent Labs  Lab 05/17/20 1756  LIPASE 24   No  results for input(s): AMMONIA in the last 168 hours. Coagulation Profile: No results for input(s): INR, PROTIME in the last 168 hours. Cardiac Enzymes: No results for input(s): CKTOTAL, CKMB, CKMBINDEX, TROPONINI in the last 168 hours. BNP (last 3 results) No results for input(s): PROBNP in the last 8760 hours. HbA1C: No results for input(s): HGBA1C in the last 72 hours. CBG: No results for input(s): GLUCAP in the last 168 hours. Lipid Profile: No results for input(s): CHOL, HDL, LDLCALC, TRIG, CHOLHDL, LDLDIRECT in the last 72 hours. Thyroid Function Tests: No results for input(s): TSH, T4TOTAL, FREET4, T3FREE, THYROIDAB in the last 72 hours. Anemia Panel: Recent Labs    05/19/20 0706 05/20/20 0040  FERRITIN 150 136   Urine analysis:    Component Value Date/Time   COLORURINE YELLOW 05/03/2020 1707   APPEARANCEUR CLEAR 05/03/2020 1707   LABSPEC 1.020 05/03/2020 1707   PHURINE 6.0 05/03/2020 1707   GLUCOSEU NEGATIVE 05/03/2020 1707   HGBUR NEGATIVE 05/03/2020 1707   BILIRUBINUR NEGATIVE 05/03/2020 1707   KETONESUR NEGATIVE 05/03/2020 1707   PROTEINUR NEGATIVE 08/15/2017 0045   UROBILINOGEN 0.2 05/03/2020 1707   NITRITE NEGATIVE 05/03/2020 1707   LEUKOCYTESUR NEGATIVE 05/03/2020 1707   Sepsis Labs: @LABRCNTIP (procalcitonin:4,lacticidven:4)  ) Recent Results (from the past 240 hour(s))  Resp Panel by RT-PCR (Flu A&B, Covid) Nasopharyngeal Swab     Status: Abnormal   Collection Time: 05/17/20  8:48 PM   Specimen: Nasopharyngeal Swab; Nasopharyngeal(NP) swabs in vial transport medium  Result Value Ref Range Status   SARS Coronavirus 2 by RT PCR POSITIVE (A) NEGATIVE Final    Comment: RESULT CALLED TO, READ BACK BY AND VERIFIED WITH: J NEWTON RN 05/17/20 2213 JDW (NOTE) SARS-CoV-2 target nucleic acids are DETECTED.  The SARS-CoV-2 RNA is generally detectable in upper respiratory specimens during the acute phase of infection. Positive results are indicative of the  presence of the identified virus, but do not rule out bacterial infection or co-infection with other pathogens not detected by the test. Clinical correlation with patient history and other diagnostic information is necessary to determine patient infection status. The expected result is Negative.  Fact Sheet for Patients: EntrepreneurPulse.com.au  Fact Sheet for Healthcare Providers: IncredibleEmployment.be  This test is not yet approved or cleared by the [place] FDA and  has been authorized for detection and/or diagnosis of SARS-CoV-2 by FDA under an Emergency Use Authorization (EUA).  This EUA will remain in effect (meaning this test can be use d) for the duration of  the COVID-19 declaration under Section 564(b)(1) of the Act, 21 U.S.C. section 360bbb-3(b)(1), unless the authorization is terminated or revoked sooner.     Influenza A by PCR NEGATIVE NEGATIVE Final   Influenza B by PCR NEGATIVE NEGATIVE Final    Comment: (NOTE) The Xpert Xpress SARS-CoV-2/FLU/RSV plus assay is  intended as an aid in the diagnosis of influenza from Nasopharyngeal swab specimens and should not be used as a sole basis for treatment. Nasal washings and aspirates are unacceptable for Xpert Xpress SARS-CoV-2/FLU/RSV testing.  Fact Sheet for Patients: EntrepreneurPulse.com.au  Fact Sheet for Healthcare Providers: IncredibleEmployment.be  This test is not yet approved or cleared by the [place] FDA and has been authorized for detection and/or diagnosis of SARS-CoV-2 by FDA under an Emergency Use Authorization (EUA). This EUA will remain in effect (meaning this test can be used) for the duration of the COVID-19 declaration under Section 564(b)(1) of the Act, 21 U.S.C. section 360bbb-3(b)(1), unless the authorization is terminated or revoked.  Performed at [facility], [place] [address] [place], [place] [address]      Radiology Studies: DG Abd Portable 1V-Small Bowel Obstruction Protocol-initial, 8 hr delay  Result Date: 05/19/2020 CLINICAL DATA:  Small-bowel obstruction EXAM: PORTABLE ABDOMEN - 1 VIEW COMPARISON:  05/18/2020 FINDINGS: Two supine frontal views of the abdomen and pelvis are obtained. Enteric catheter tip and side port project over the gastric fundus. There is excreted contrast within the bladder related to previous CT. Oral contrast is seen within the stomach and duodenal sweep. Dilute contrast is seen filling dilated loops of small bowel within the left mid abdomen. No evidence of distal small bowel or colonic contrast. IMPRESSION: 1. No evidence of colonic contrast. Persistent dilated small bowel consistent with small-bowel obstruction. Electronically Signed   By: Randa Ngo M.D.   On: 05/19/2020 20:18   Scheduled Meds: . enoxaparin (LOVENOX) injection  40 mg Subcutaneous Q24H   Continuous Infusions: . sodium chloride    . sodium chloride 75 mL/hr at 05/21/20 0600     LOS: 4 days    Time spent: 44min  Domenic Polite, MD [facility]  05/21/2020, 12:51 PM

## 2020-05-21 NOTE — Plan of Care (Signed)
  Problem: Education: Goal: Knowledge of General Education information will improve Description: Including pain rating scale, medication(s)/side effects and non-pharmacologic comfort measures Outcome: Progressing   Problem: Health Behavior/Discharge Planning: Goal: Ability to manage health-related needs will improve Outcome: Progressing   Problem: Clinical Measurements: Goal: Will remain free from infection Outcome: Progressing   Problem: Activity: Goal: Risk for activity intolerance will decrease Outcome: Progressing   Problem: Education: Goal: Knowledge of risk factors and measures for prevention of condition will improve Outcome: Progressing   Problem: Respiratory: Goal: Will maintain a patent airway Outcome: Progressing   Problem: Clinical Measurements: Goal: Diagnostic test results will improve Outcome: Not Progressing   Problem: Nutrition: Goal: Adequate nutrition will be maintained Outcome: Not Progressing   Problem: Elimination: Goal: Will not experience complications related to bowel motility Outcome: Not Progressing

## 2020-05-22 ENCOUNTER — Inpatient Hospital Stay (HOSPITAL_COMMUNITY): Payer: Medicare HMO

## 2020-05-22 ENCOUNTER — Inpatient Hospital Stay: Payer: Self-pay

## 2020-05-22 DIAGNOSIS — K56609 Unspecified intestinal obstruction, unspecified as to partial versus complete obstruction: Secondary | ICD-10-CM | POA: Diagnosis not present

## 2020-05-22 LAB — CBC WITH DIFFERENTIAL/PLATELET
Abs Immature Granulocytes: 0.03 10*3/uL (ref 0.00–0.07)
Basophils Absolute: 0 10*3/uL (ref 0.0–0.1)
Basophils Relative: 0 %
Eosinophils Absolute: 0 10*3/uL (ref 0.0–0.5)
Eosinophils Relative: 0 %
HCT: 41.6 % (ref 36.0–46.0)
Hemoglobin: 13.3 g/dL (ref 12.0–15.0)
Immature Granulocytes: 0 %
Lymphocytes Relative: 20 %
Lymphs Abs: 1.4 10*3/uL (ref 0.7–4.0)
MCH: 29.6 pg (ref 26.0–34.0)
MCHC: 32 g/dL (ref 30.0–36.0)
MCV: 92.4 fL (ref 80.0–100.0)
Monocytes Absolute: 0.9 10*3/uL (ref 0.1–1.0)
Monocytes Relative: 13 %
Neutro Abs: 4.5 10*3/uL (ref 1.7–7.7)
Neutrophils Relative %: 67 %
Platelets: 237 10*3/uL (ref 150–400)
RBC: 4.5 MIL/uL (ref 3.87–5.11)
RDW: 13.4 % (ref 11.5–15.5)
WBC: 6.8 10*3/uL (ref 4.0–10.5)
nRBC: 0 % (ref 0.0–0.2)

## 2020-05-22 LAB — COMPREHENSIVE METABOLIC PANEL
ALT: 13 U/L (ref 0–44)
AST: 18 U/L (ref 15–41)
Albumin: 2.9 g/dL — ABNORMAL LOW (ref 3.5–5.0)
Alkaline Phosphatase: 64 U/L (ref 38–126)
Anion gap: 11 (ref 5–15)
BUN: 19 mg/dL (ref 8–23)
CO2: 22 mmol/L (ref 22–32)
Calcium: 8.9 mg/dL (ref 8.9–10.3)
Chloride: 111 mmol/L (ref 98–111)
Creatinine, Ser: 0.79 mg/dL (ref 0.44–1.00)
GFR, Estimated: >60 mL/min (ref >60)
Glucose, Bld: 101 mg/dL — ABNORMAL HIGH (ref 70–99)
Potassium: 3.3 mmol/L — ABNORMAL LOW (ref 3.5–5.1)
Sodium: 144 mmol/L (ref 135–145)
Total Bilirubin: 0.9 mg/dL (ref 0.3–1.2)
Total Protein: 6.3 g/dL — ABNORMAL LOW (ref 6.5–8.1)

## 2020-05-22 LAB — GLUCOSE, CAPILLARY: Glucose-Capillary: 74 mg/dL (ref 70–99)

## 2020-05-22 LAB — PHOSPHORUS: Phosphorus: 2.9 mg/dL (ref 2.5–4.6)

## 2020-05-22 LAB — TRIGLYCERIDES: Triglycerides: 91 mg/dL (ref <150)

## 2020-05-22 LAB — PREALBUMIN: Prealbumin: 15.6 mg/dL — ABNORMAL LOW (ref 18–38)

## 2020-05-22 LAB — MAGNESIUM: Magnesium: 2.1 mg/dL (ref 1.7–2.4)

## 2020-05-22 IMAGING — DX DG ABD PORTABLE 1V
2 series · 2 of 2 positions shown · non-contrast
Comparison: [DATE]

CLINICAL DATA: Vomiting, abdominal pain

EXAM:
PORTABLE ABDOMEN - 1 VIEW

[abdomen kub (1 of 2)]
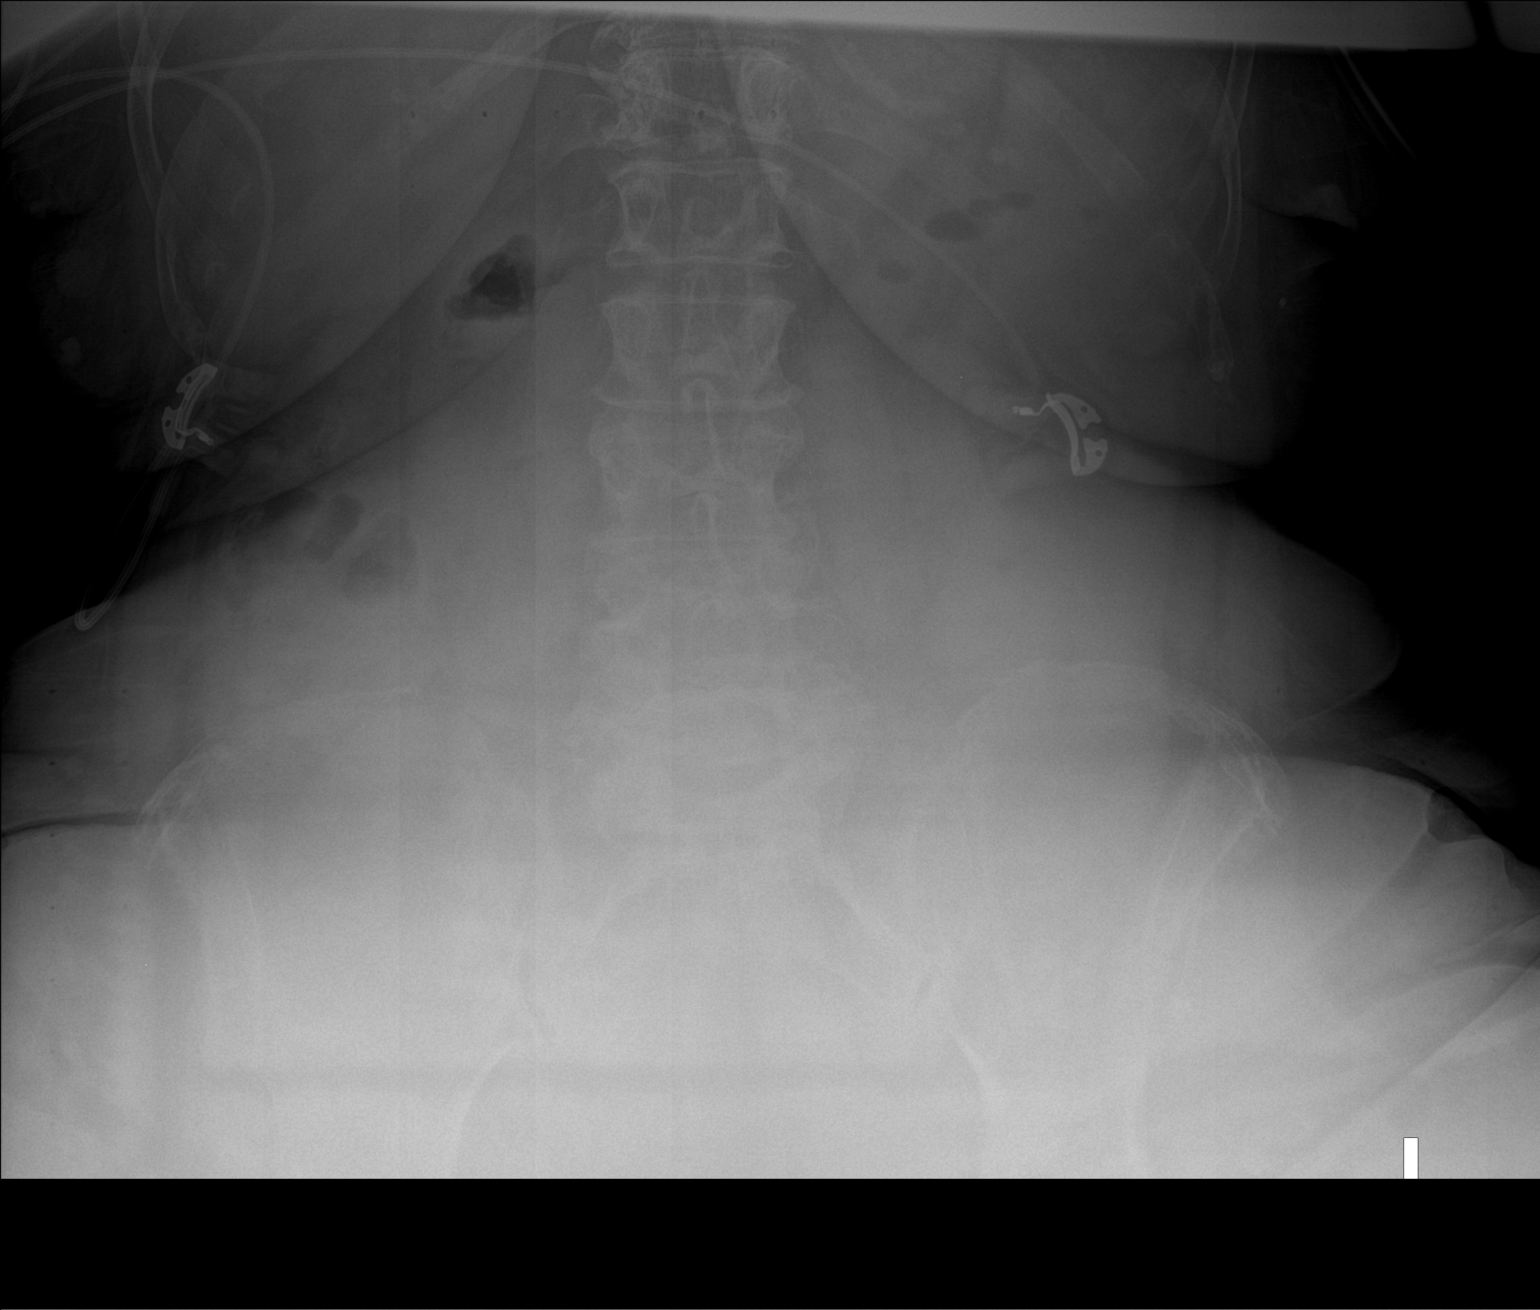

[abdomen kub (2 of 2)]
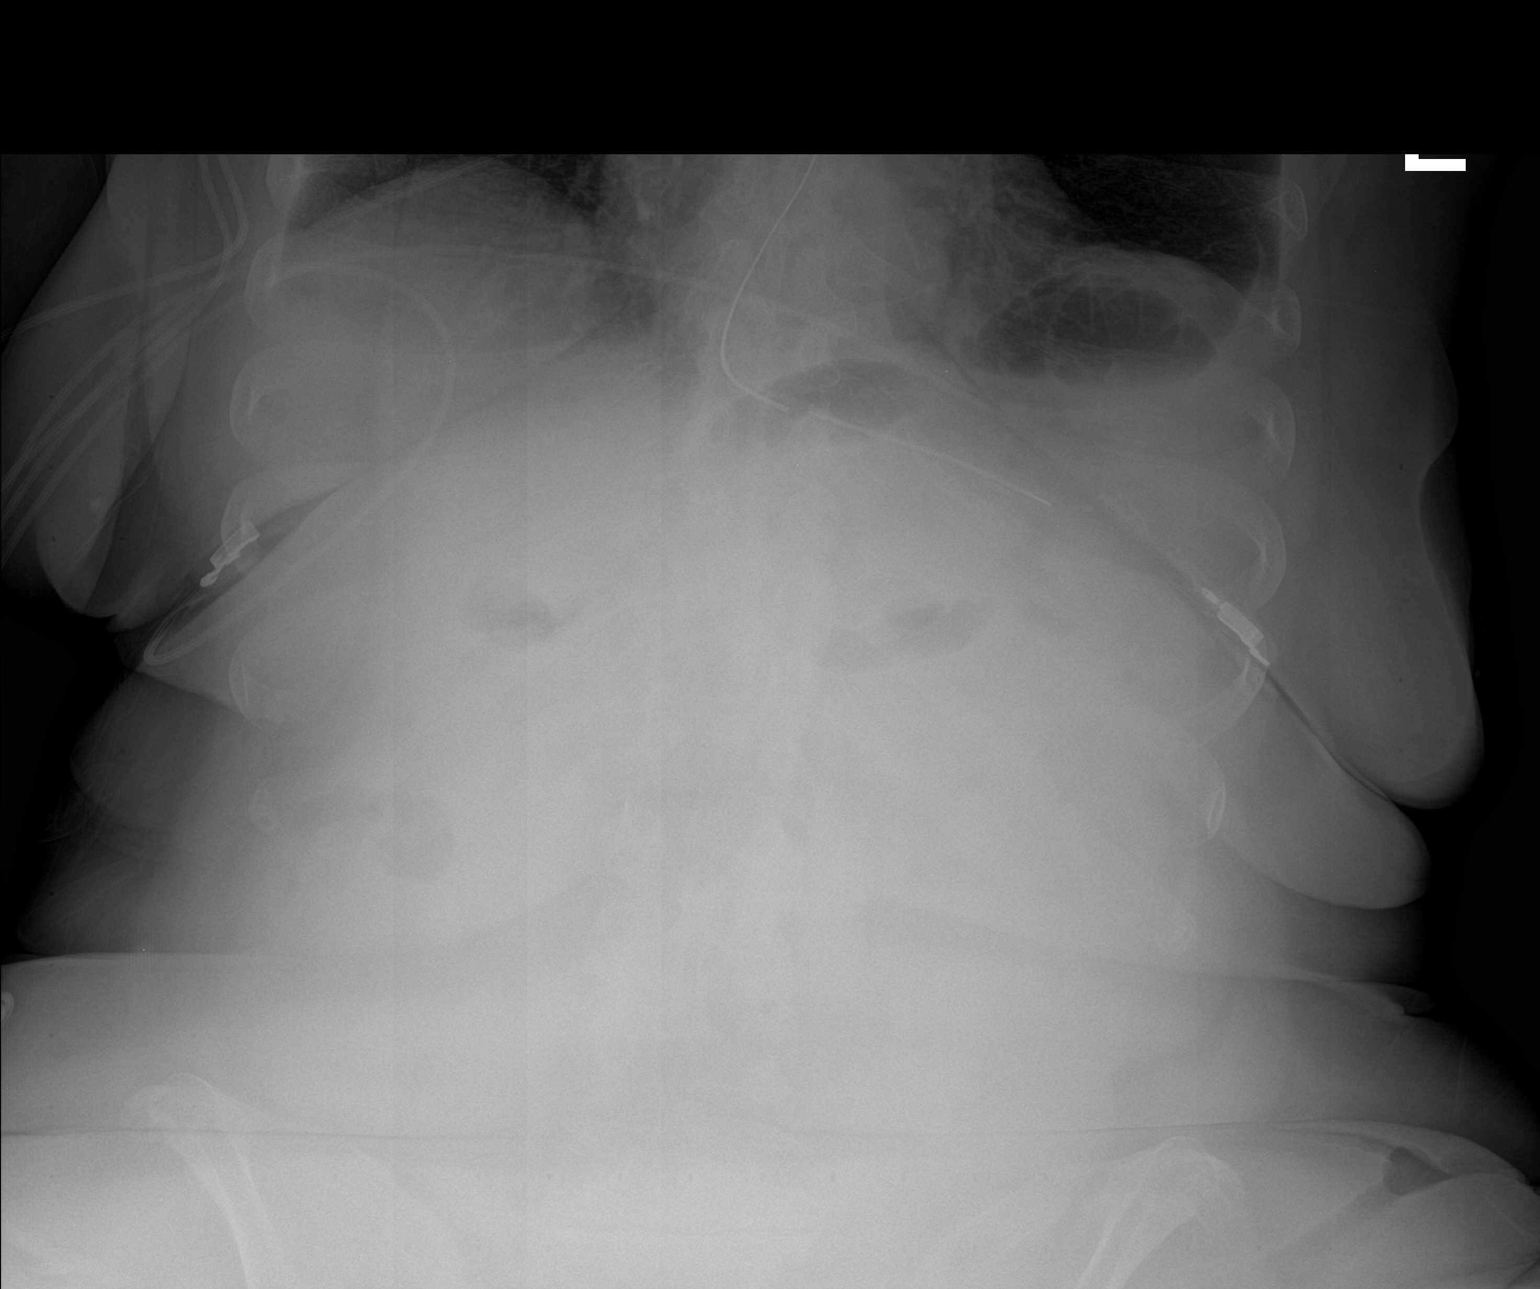

[2 of 2 positions shown; findings below may reference images not displayed]

FINDINGS: NG tube remains in the stomach. Decreasing bowel distension. No
organomegaly or free air. Small left pleural effusion noted with
left base atelectasis.
IMPRESSION: Improving bowel gas pattern with decreasing bowel distention since
prior study.

NG tube remains in the stomach.

## 2020-05-22 MED ORDER — HYDROMORPHONE HCL 1 MG/ML IJ SOLN
1.0000 mg | INTRAMUSCULAR | Status: DC | PRN
Start: 2020-05-22 — End: 2020-05-24
  Administered 2020-05-22 – 2020-05-23 (×4): 1 mg via INTRAVENOUS
  Filled 2020-05-22 (×4): qty 1

## 2020-05-22 MED ORDER — TRAVASOL 10 % IV SOLN
INTRAVENOUS | Status: AC
  Filled 2020-05-22: qty 443.5

## 2020-05-22 MED ORDER — INSULIN ASPART 100 UNIT/ML ~~LOC~~ SOLN
0.0000 [IU] | Freq: Four times a day (QID) | SUBCUTANEOUS | Status: DC
  Administered 2020-05-23 (×3): 1 [IU] via SUBCUTANEOUS
  Administered 2020-05-24: 2 [IU] via SUBCUTANEOUS
  Administered 2020-05-24 (×2): 1 [IU] via SUBCUTANEOUS
  Administered 2020-05-24: 3 [IU] via SUBCUTANEOUS
  Administered 2020-05-25 – 2020-05-26 (×4): 2 [IU] via SUBCUTANEOUS
  Administered 2020-05-26: 1 [IU] via SUBCUTANEOUS
  Administered 2020-05-26 – 2020-05-28 (×6): 2 [IU] via SUBCUTANEOUS
  Administered 2020-05-28: 1 [IU] via SUBCUTANEOUS
  Administered 2020-05-28 (×2): 2 [IU] via SUBCUTANEOUS
  Administered 2020-05-29 – 2020-05-31 (×5): 1 [IU] via SUBCUTANEOUS

## 2020-05-22 MED ORDER — SODIUM CHLORIDE 0.9 % IV SOLN: INTRAVENOUS | Status: AC

## 2020-05-22 MED ORDER — SODIUM CHLORIDE 0.9% FLUSH
10.0000 mL | INTRAVENOUS | Status: DC | PRN
  Administered 2020-05-23: 20 mL
  Administered 2020-05-31: 10 mL

## 2020-05-22 MED ORDER — CHLORHEXIDINE GLUCONATE CLOTH 2 % EX PADS
6.0000 | MEDICATED_PAD | Freq: Every day | CUTANEOUS | Status: DC
  Administered 2020-05-22 – 2020-05-25 (×4): 6 via TOPICAL

## 2020-05-22 NOTE — Progress Notes (Signed)
Upon entering pt room approximately 1045, pt up in chair finishing bath with nurse tech.  Pt toileted and returned to bed for PICC procedure after giving consent.  Pt states in more pain than before.  Throughout PICC procedure, pt moaning and writhing in pain, with frequent expectorating large amounts of clear, thick phlegm.  Pt appears somewhat lethargic, but answering questions with grunts and nods appropriately.  Requesting something for pain. Denyse Amass RN notified of PICC procedure and pt events as stated above.

## 2020-05-22 NOTE — Progress Notes (Signed)
PHARMACY - TOTAL PARENTERAL NUTRITION CONSULT NOTE   Indication: Small bowel obstruction  Patient Measurements: Height: 5\' 5"  (165.1 cm) Weight: 79.6 kg (175 lb 7.8 oz) IBW/kg (Calculated) : 57   Body mass index is 29.2 kg/m. Usual Weight:   Assessment: 56 YOF who presented on 12/28 with abd pain, N/V and was found via KUB and Abd CT showing SBO thought to be due to adhesions. The patient previously had a SBO back in 2016 which required surgery to correct. Concerned for the same this admission. D#6 of NPO, NGT placed for decompression, bowel rest, pre-albumin low at 15.6 - pharmacy consulted for TPN initiation.   Glucose / Insulin: CBGs<180 - no SSI Electrolytes: From 12/31: Na 139, K 3.9, HCO3 20. Labs ordered for 1/2 however per discussion with RN, pt is hard stick and unlikely these can be drawn until PICC placed. Renal: SCr 0.92 on 12/31 LFTs / TGs: LFTs wnl on 12/28, no TG yet Prealbumin / albumin: Pre-albumin 15.6 Intake / Output; MIVF: NGT output/24h: 550 cc, UOP not accurately charted, on NS @ 75 cc/hr GI Imaging: 12/28 Abd CT: SBO likely d/t adhesions, no perf or evidence of ischemia 1/1 Abd X-ray: Persistent SBO w/ no evidence of improvement Surgeries / Procedures: NGT placed 12/28  Central access: PICC to be placed 1/2 (confirmed w/ IV team can place today if consent obtained) TPN start date: 1/2  Nutritional Goals (per RD recommendation on 05/19/20): kCal: 1800-1950, Protein: 85-95, Fluid: >/=1.8L/day Goal TPN rate is 75 mL/hr (provides 95 g of protein and 1804 kcals per day) Using Intralipid 20% instead of SMOFlipids due to allergies - will target 10% kcal due to shortage  Current Nutrition:  NPO  Plan:  Start TPN at 33mL/hr at 1800 to provide 842 kcal and 44g protein meeting ~46% of estimated needs Give fish, shellfish, and peanut allergies - will use Intralipid emulsion for this patient - will target 10% given shortage Electrolytes in TPN: 64mEq/L of Na, 79mEq/L  of K, 15mEq/L of Ca, 65mEq/L of Mg, and 66mmol/L of Phos. Cl:Ac ratio 1:1 Add standard MVI and trace elements to TPN Initiate Sensitive q6h SSI and adjust as needed  Reduce MIVF to 40 mL/hr at 1800 Monitor TPN labs on Mon/Thurs  Thank you for allowing pharmacy to be a part of this patient's care.  Alycia Rossetti, PharmD, BCPS Clinical Pharmacist Clinical phone for 05/22/2020: [phone] 05/22/2020 10:14 AM   **Pharmacist phone directory can now be found on [email or web address] ([email or web address]).  Listed under [place].

## 2020-05-22 NOTE — Progress Notes (Signed)
Progress Note     Subjective: Patient reports upper abdominal pain. She denies any bowel function. She reports this feels similar to how it did when she had SBO the last time and ended up going to the OR.   Objective: Vital signs in last 24 hours: Temp:  [98.4 F (36.9 C)-98.9 F (37.2 C)] 98.9 F (37.2 C) (01/02 0510) Pulse Rate:  [84-98] 84 (01/02 0510) Resp:  [18-20] 18 (01/02 0510) BP: (136-154)/(81-106) 136/96 (01/02 0510) SpO2:  [96 %-100 %] 100 % (01/02 0510) Last BM Date: 05/17/20  Intake/Output from previous day: 01/01 0701 - 01/02 0700 In: 1594.9 [I.V.:1594.9] Out: 750 [Urine:200; Emesis/NG output:550] Intake/Output this shift: No intake/output data recorded.  PE: General: pleasant, WD, overweight female who is sitting up in chair  Heart: regular, rate, and rhythm.   Lungs:  Respiratory effort nonlabored Abd: soft, mildly ttp in epigastrium without peritonitis, mildly distended MS: all 4 extremities are symmetrical with no cyanosis, clubbing, or edema.     Lab Results:  Recent Labs    05/20/20 0040  WBC 8.4  HGB 15.8*  HCT 46.1*  PLT 219   BMET Recent Labs    05/20/20 0040  NA 139  K 3.9  CL 107  CO2 20*  GLUCOSE 112*  BUN 14  CREATININE 0.92  CALCIUM 9.0   PT/INR No results for input(s): LABPROT, INR in the last 72 hours. CMP     Component Value Date/Time   NA 139 05/20/2020 0040   K 3.9 05/20/2020 0040   CL 107 05/20/2020 0040   CO2 20 (L) 05/20/2020 0040   GLUCOSE 112 (H) 05/20/2020 0040   BUN 14 05/20/2020 0040   CREATININE 0.92 05/20/2020 0040   CALCIUM 9.0 05/20/2020 0040   PROT 8.0 05/17/2020 1756   PROT 7.7 06/07/2017 0940   ALBUMIN 4.0 05/17/2020 1756   AST 28 05/17/2020 1756   ALT 15 05/17/2020 1756   ALKPHOS 91 05/17/2020 1756   BILITOT 0.5 05/17/2020 1756   GFRNONAA >60 05/20/2020 0040   GFRAA >60 08/15/2017 0012   Lipase     Component Value Date/Time   LIPASE 24 05/17/2020 1756        Studies/Results: DG Abd 1 View  Result Date: 05/21/2020 CLINICAL DATA:  80 year old female with small bowel obstruction. EXAM: ABDOMEN - 1 VIEW COMPARISON:  CT Abdomen and Pelvis 05/17/2020. Postcontrast abdominal radiographs 05/19/2020. FINDINGS: Portable AP supine views since 05/19/2020 the oral contrast has become dilute throughout the abdomen and pelvis. Still, faint contrast is visible within a dilated left abdominal loop seen previously (arrow). And there is no convincing large bowel contrast. Stable enteric tube visible in the epigastrium. Stable abdominal and pelvic visceral contours. Stable visualized osseous structures. At 1313 hours. IMPRESSION: 1. Persistent small bowel obstruction, with no evidence of improvement since 05/19/2020. 2. Stable enteric tube. Electronically Signed   By: Genevie Ann M.D.   On: 05/21/2020 13:31   [place] EKG SITE RITE  Result Date: 05/22/2020 If Site Rite image not attached, placement could not be confirmed due to current cardiac rhythm.   Anti-infectives: Anti-infectives (From admission, onward)   None       Assessment/Plan HTN HLD GERD Asthma LLL lung mass - noted on CT, concerning for primary bronchogenic malignancy Enlarged thyroid COVD Positive Malnourished/deconditioned  SBO -PSH: tubal ligation, hysterectomy, Hx exploratory laparotomy, lysis of adhesions, 04/18/2015, DR. Rosendo Gros - CT 12/28 shows [place] transition point in the mid central abdomen, likely due to adhesions; small volume  of non organized free fluid in the abdomen and pelvis; no perforation or evidence of ischemia - No bowel function, continue NGT to LIWS - prealbumin 15 - ordered PICC and TPN today  - It seems patient is not really getting better with conservative management, she will likely need an operation this week but this is not emergent at this time. I will order another film for the AM. Surgical team will reassess tomorrow  ID -none FEN -IVF, NPO/NGT to  LIWS; TPN ordered  VTE -SCDs, lovenox Foley -none Follow up -TBD   LOS: 5 days    Norm Parcel , [facility] Surgery 05/22/2020, 9:54 AM Please see Amion for pager number during day hours 7:00am-4:30pm

## 2020-05-22 NOTE — Progress Notes (Signed)
Peripherally Inserted Central Catheter Placement  The IV Nurse has discussed with the patient and/or persons authorized to consent for the patient, the purpose of this procedure and the potential benefits and risks involved with this procedure.  The benefits include less needle sticks, lab draws from the catheter, and the patient may be discharged home with the catheter. Risks include, but not limited to, infection, bleeding, blood clot (thrombus formation), and puncture of an artery; nerve damage and irregular heartbeat and possibility to perform a PICC exchange if needed/ordered by physician.  Alternatives to this procedure were also discussed.  Bard Power PICC patient education guide, fact sheet on infection prevention and patient information card has been provided to patient /or left at bedside.    PICC Placement Documentation  PICC Double Lumen 05/22/20 PICC Right Brachial 37 cm 0 cm (Active)  Indication for Insertion or Continuance of Line Administration of hyperosmolar/irritating solutions (i.e. TPN, Vancomycin, etc.) 05/22/20 1159  Exposed Catheter (cm) 0 cm 05/22/20 1159  Site Assessment Clean;Dry;Intact 05/22/20 1159  Lumen #1 Status Flushed;Saline locked;Blood return noted 05/22/20 1159  Lumen #2 Status Flushed;Saline locked;Blood return noted 05/22/20 1159  Dressing Type Transparent 05/22/20 1159  Dressing Status Clean;Dry;Intact 05/22/20 1159  Antimicrobial disc in place? Yes 05/22/20 1159  Safety Lock Not Applicable 18/29/93 7169  Line Care Connections checked and tightened 05/22/20 1159  Line Adjustment (NICU/IV Team Only) No 05/22/20 1159  Dressing Intervention New dressing 05/22/20 1159  Dressing Change Due 05/29/20 05/22/20 1159       Rolena Infante 05/22/2020, 12:00 PM

## 2020-05-22 NOTE — Progress Notes (Signed)
PROGRESS NOTE    Angelica [name]  KGU:[id number] DOB: 05/15/1941 DOA: 05/17/2020 PCP: [name], MD   Brief Narrative: Taken from prior notes. Angelica [name] a 80 y.o.femalewith medical history significant forhypertension and thyroid disease, SBO in 2016, presented to the emergency department 12/20 with abdominal pain, nausea, and vomiting. -Work-up in the ED noted incidental diagnosis of Covid and a CT abdomen pelvis which was concerning for small bowel obstruction with transition in the mid central abdomen likely from adhesions in addition CT chest noted left lower lobe lung mass. -General surgery following, failed small bowel protocol -Remains asymptomatic from Covid  Subjective: Patient was complaining of worsening abdominal pain, apparently morphine is not helping and asking for something stronger.  Received PICC line for TPN.  Assessment & Plan:   Principal Problem:   SBO (small bowel obstruction) (HCC) Active Problems:   HTN (hypertension)   Small bowel obstruction (HCC)   Asthma, severe persistent, well-controlled   Pulmonary nodule  Persistent SBO.  Likely secondary to adhesions from prior surgery.  Required surgery during prior episode of SBO.  Initial CT noted transition points in mid central abdomen.  Repeat DG abdomen today with some improvement in bowel dilatation.  No improvement clinically. General surgery is following-she might need surgery again but they want to follow-up for next couple of days before making final decision. PICC line was placed today and she will be started on TPN. -Continue with NG tube. -Switch morphine with Dilaudid to see if that will help with her pain.  Left lower lobe lung mass  Incidentally noted on imaging, day of admission -With mediastinal and hilar lymphadenopathy concerning for primary bronchogenic carcinoma -Will request pulmonary evaluation once SBO has resolved for biopsy  SARS COVID-19 infection -Asymptomatic, not  hypoxic, chest x-ray clear, she is vaccinated. Mildly elevated D-dimer, CRP within normal limit. -Given monoclonal antibody infusion on 12/29 -Continue to monitor  Asthma -No wheezing, continue albuterol PRN  Hypertension.  Blood pressure mildly elevated, patient was in pain. -Continue to monitor.  Objective: Vitals:   05/21/20 0740 05/21/20 1353 05/21/20 2048 05/22/20 0510  BP: (!) 146/86 (!) 154/81 (!) 145/106 (!) 136/96  Pulse: 94 88 98 84  Resp:   20 18  Temp: 98.2 F (36.8 C) 98.4 F (36.9 C) 98.9 F (37.2 C) 98.9 F (37.2 C)  TempSrc: Oral Oral Oral Oral  SpO2: 98% 100% 96% 100%  Weight:      Height:        Intake/Output Summary (Last 24 hours) at 05/22/2020 1512 Last data filed at 05/22/2020 0416 Gross per 24 hour  Intake 1594.85 ml  Output 650 ml  Net 944.85 ml   Filed Weights   05/19/20 0105  Weight: 79.6 kg    Examination:  General exam: Chronically ill-appearing elderly lady, appears in pain. Respiratory system: Clear to auscultation. Respiratory effort normal. Cardiovascular system: S1 & S2 heard, RRR.  Gastrointestinal system: Soft, diffusely tender, bowel sounds hypoactive. Central nervous system: Alert and oriented. No focal neurological deficits. Extremities: No edema, no cyanosis, pulses intact and symmetrical. Psychiatry: Judgement and insight appear normal.    DVT prophylaxis: Lovenox Code Status: Full Family Communication: daughter was updated on phone. Disposition Plan:  Status is: Inpatient  Remains inpatient appropriate because:Inpatient level of care appropriate due to severity of illness   Dispo: The patient is from: Home              Anticipated d/c is to: To be determined  Anticipated d/c date is: 3 days              Patient currently is not medically stable to d/c.   Consultants:   General surgery  Procedures:  Antimicrobials:   Data Reviewed: I have personally reviewed following labs and imaging  studies  CBC: Recent Labs  Lab 05/17/20 1756 05/18/20 0319 05/19/20 0915 05/20/20 0040  WBC 6.5 10.1 7.3 8.4  HGB 15.1* 16.0* 16.3* 15.8*  HCT 45.5 47.0* 46.9* 46.1*  MCV 89.9 89.5 88.2 88.7  PLT 274 295 293 637   Basic Metabolic Panel: Recent Labs  Lab 05/17/20 1756 05/18/20 0319 05/19/20 0706 05/20/20 0040  NA 135 136 137 139  K 3.7 3.5 4.2 3.9  CL 100 102 104 107  CO2 22 22 20* 20*  GLUCOSE 141* 191* 145* 112*  BUN 10 8 12 14   CREATININE 0.79 0.87 0.86 0.92  CALCIUM 9.8 9.5 9.4 9.0   GFR: Estimated Creatinine Clearance: 50.8 mL/min (by C-G formula based on SCr of 0.92 mg/dL). Liver Function Tests: Recent Labs  Lab 05/17/20 1756  AST 28  ALT 15  ALKPHOS 91  BILITOT 0.5  PROT 8.0  ALBUMIN 4.0   Recent Labs  Lab 05/17/20 1756  LIPASE 24   No results for input(s): AMMONIA in the last 168 hours. Coagulation Profile: No results for input(s): INR, PROTIME in the last 168 hours. Cardiac Enzymes: No results for input(s): CKTOTAL, CKMB, CKMBINDEX, TROPONINI in the last 168 hours. BNP (last 3 results) No results for input(s): PROBNP in the last 8760 hours. HbA1C: No results for input(s): HGBA1C in the last 72 hours. CBG: No results for input(s): GLUCAP in the last 168 hours. Lipid Profile: No results for input(s): CHOL, HDL, LDLCALC, TRIG, CHOLHDL, LDLDIRECT in the last 72 hours. Thyroid Function Tests: No results for input(s): TSH, T4TOTAL, FREET4, T3FREE, THYROIDAB in the last 72 hours. Anemia Panel: Recent Labs    05/20/20 0040  FERRITIN 136   Sepsis Labs: No results for input(s): PROCALCITON, LATICACIDVEN in the last 168 hours.  Recent Results (from the past 240 hour(s))  Resp Panel by RT-PCR (Flu A&B, Covid) Nasopharyngeal Swab     Status: Abnormal   Collection Time: 05/17/20  8:48 PM   Specimen: Nasopharyngeal Swab; Nasopharyngeal(NP) swabs in vial transport medium  Result Value Ref Range Status   SARS Coronavirus 2 by RT PCR POSITIVE (A)  NEGATIVE Final    Comment: RESULT CALLED TO, READ BACK BY AND VERIFIED WITH: J NEWTON RN 05/17/20 2213 JDW (NOTE) SARS-CoV-2 target nucleic acids are DETECTED.  The SARS-CoV-2 RNA is generally detectable in upper respiratory specimens during the acute phase of infection. Positive results are indicative of the presence of the identified virus, but do not rule out bacterial infection or co-infection with other pathogens not detected by the test. Clinical correlation with patient history and other diagnostic information is necessary to determine patient infection status. The expected result is Negative.  Fact Sheet for Patients: EntrepreneurPulse.com.au  Fact Sheet for Healthcare Providers: IncredibleEmployment.be  This test is not yet approved or cleared by the [place] FDA and  has been authorized for detection and/or diagnosis of SARS-CoV-2 by FDA under an Emergency Use Authorization (EUA).  This EUA will remain in effect (meaning this test can be use d) for the duration of  the COVID-19 declaration under Section 564(b)(1) of the Act, 21 U.S.C. section 360bbb-3(b)(1), unless the authorization is terminated or revoked sooner.     Influenza A by  PCR NEGATIVE NEGATIVE Final   Influenza B by PCR NEGATIVE NEGATIVE Final    Comment: (NOTE) The Xpert Xpress SARS-CoV-2/FLU/RSV plus assay is intended as an aid in the diagnosis of influenza from Nasopharyngeal swab specimens and should not be used as a sole basis for treatment. Nasal washings and aspirates are unacceptable for Xpert Xpress SARS-CoV-2/FLU/RSV testing.  Fact Sheet for Patients: EntrepreneurPulse.com.au  Fact Sheet for Healthcare Providers: IncredibleEmployment.be  This test is not yet approved or cleared by the [place] FDA and has been authorized for detection and/or diagnosis of SARS-CoV-2 by FDA under an Emergency Use Authorization  (EUA). This EUA will remain in effect (meaning this test can be used) for the duration of the COVID-19 declaration under Section 564(b)(1) of the Act, 21 U.S.C. section 360bbb-3(b)(1), unless the authorization is terminated or revoked.  Performed at [facility], [address], [place], [place] [address]      Radiology Studies: DG Abd 1 View  Result Date: 05/21/2020 CLINICAL DATA:  80 year old female with small bowel obstruction. EXAM: ABDOMEN - 1 VIEW COMPARISON:  CT Abdomen and Pelvis 05/17/2020. Postcontrast abdominal radiographs 05/19/2020. FINDINGS: Portable AP supine views since 05/19/2020 the oral contrast has become dilute throughout the abdomen and pelvis. Still, faint contrast is visible within a dilated left abdominal loop seen previously (arrow). And there is no convincing large bowel contrast. Stable enteric tube visible in the epigastrium. Stable abdominal and pelvic visceral contours. Stable visualized osseous structures. At 1313 hours. IMPRESSION: 1. Persistent small bowel obstruction, with no evidence of improvement since 05/19/2020. 2. Stable enteric tube. Electronically Signed   By: Genevie Ann M.D.   On: 05/21/2020 13:31   DG Abd Portable 1V  Result Date: 05/22/2020 CLINICAL DATA:  Vomiting, abdominal pain EXAM: PORTABLE ABDOMEN - 1 VIEW COMPARISON:  05/21/2020 FINDINGS: NG tube remains in the stomach. Decreasing bowel distension. No organomegaly or free air. Small left pleural effusion noted with left base atelectasis. IMPRESSION: Improving bowel gas pattern with decreasing bowel distention since prior study. NG tube remains in the stomach. Electronically Signed   By: Rolm Baptise M.D.   On: 05/22/2020 11:33   [place] EKG SITE RITE  Result Date: 05/22/2020 If Site Rite image not attached, placement could not be confirmed due to current cardiac rhythm.   Scheduled Meds: . Chlorhexidine Gluconate Cloth  6 each Topical Daily  . enoxaparin (LOVENOX) injection  40 mg  Subcutaneous Q24H  . insulin aspart  0-9 Units Subcutaneous Q6H   Continuous Infusions: . sodium chloride    . sodium chloride 75 mL/hr at 05/22/20 1014  . sodium chloride    . TPN ADULT (ION)       LOS: 5 days   Time spent: 40 minutes.  Lorella Nimrod, MD [facility]  If 7PM-7AM, please contact night-coverage [email or web address]  05/22/2020, 3:12 PM   This record has been created using Systems analyst. Errors have been sought and corrected,but may not always be located. Such creation errors do not reflect on the standard of care.

## 2020-05-23 ENCOUNTER — Encounter (HOSPITAL_COMMUNITY): Payer: Self-pay | Admitting: Family Medicine

## 2020-05-23 ENCOUNTER — Inpatient Hospital Stay (HOSPITAL_COMMUNITY): Payer: Medicare HMO

## 2020-05-23 ENCOUNTER — Inpatient Hospital Stay (HOSPITAL_COMMUNITY): Payer: Medicare HMO | Admitting: Anesthesiology

## 2020-05-23 ENCOUNTER — Encounter (HOSPITAL_COMMUNITY)
Admission: EM | Disposition: A | Discharge status: Home Health | Payer: Self-pay | Source: Ambulatory Visit | Attending: Family Medicine

## 2020-05-23 DIAGNOSIS — K56609 Unspecified intestinal obstruction, unspecified as to partial versus complete obstruction: Secondary | ICD-10-CM | POA: Diagnosis not present

## 2020-05-23 HISTORY — PX: LAPAROTOMY: SHX154

## 2020-05-23 HISTORY — PX: BOWEL RESECTION: SHX1257

## 2020-05-23 HISTORY — PX: LYSIS OF ADHESION: SHX5961

## 2020-05-23 HISTORY — PX: DIAGNOSTIC LARYNGOSCOPY: SHX5368

## 2020-05-23 LAB — MAGNESIUM: Magnesium: 2.1 mg/dL (ref 1.7–2.4)

## 2020-05-23 LAB — DIFFERENTIAL
Abs Immature Granulocytes: 0.02 10*3/uL (ref 0.00–0.07)
Basophils Absolute: 0 10*3/uL (ref 0.0–0.1)
Basophils Relative: 0 %
Eosinophils Absolute: 0 10*3/uL (ref 0.0–0.5)
Eosinophils Relative: 0 %
Immature Granulocytes: 0 %
Lymphocytes Relative: 26 %
Lymphs Abs: 1.4 10*3/uL (ref 0.7–4.0)
Monocytes Absolute: 0.8 10*3/uL (ref 0.1–1.0)
Monocytes Relative: 14 %
Neutro Abs: 3.2 10*3/uL (ref 1.7–7.7)
Neutrophils Relative %: 60 %

## 2020-05-23 LAB — GLUCOSE, CAPILLARY
Glucose-Capillary: 145 mg/dL — ABNORMAL HIGH (ref 70–99)
Glucose-Capillary: 146 mg/dL — ABNORMAL HIGH (ref 70–99)
Glucose-Capillary: 150 mg/dL — ABNORMAL HIGH (ref 70–99)

## 2020-05-23 LAB — COMPREHENSIVE METABOLIC PANEL
ALT: 14 U/L (ref 0–44)
AST: 18 U/L (ref 15–41)
Albumin: 2.8 g/dL — ABNORMAL LOW (ref 3.5–5.0)
Alkaline Phosphatase: 59 U/L (ref 38–126)
Anion gap: 10 (ref 5–15)
BUN: 21 mg/dL (ref 8–23)
CO2: 24 mmol/L (ref 22–32)
Calcium: 8.9 mg/dL (ref 8.9–10.3)
Chloride: 110 mmol/L (ref 98–111)
Creatinine, Ser: 0.75 mg/dL (ref 0.44–1.00)
GFR, Estimated: >60 mL/min (ref >60)
Glucose, Bld: 163 mg/dL — ABNORMAL HIGH (ref 70–99)
Potassium: 3.3 mmol/L — ABNORMAL LOW (ref 3.5–5.1)
Sodium: 144 mmol/L (ref 135–145)
Total Bilirubin: 0.8 mg/dL (ref 0.3–1.2)
Total Protein: 6.2 g/dL — ABNORMAL LOW (ref 6.5–8.1)

## 2020-05-23 LAB — CBC
HCT: 41.5 % (ref 36.0–46.0)
Hemoglobin: 13.3 g/dL (ref 12.0–15.0)
MCH: 29.8 pg (ref 26.0–34.0)
MCHC: 32 g/dL (ref 30.0–36.0)
MCV: 93 fL (ref 80.0–100.0)
Platelets: 245 10*3/uL (ref 150–400)
RBC: 4.46 MIL/uL (ref 3.87–5.11)
RDW: 13.4 % (ref 11.5–15.5)
WBC: 5.4 10*3/uL (ref 4.0–10.5)
nRBC: 0 % (ref 0.0–0.2)

## 2020-05-23 LAB — TRIGLYCERIDES: Triglycerides: 97 mg/dL (ref <150)

## 2020-05-23 LAB — PHOSPHORUS: Phosphorus: 2.5 mg/dL (ref 2.5–4.6)

## 2020-05-23 IMAGING — DX DG ABD PORTABLE 1V
1 series · 1 of 1 positions shown · non-contrast
Comparison: [DATE] and prior.

CLINICAL DATA: SBO

EXAM:
PORTABLE ABDOMEN - 1 VIEW

[abdomen]
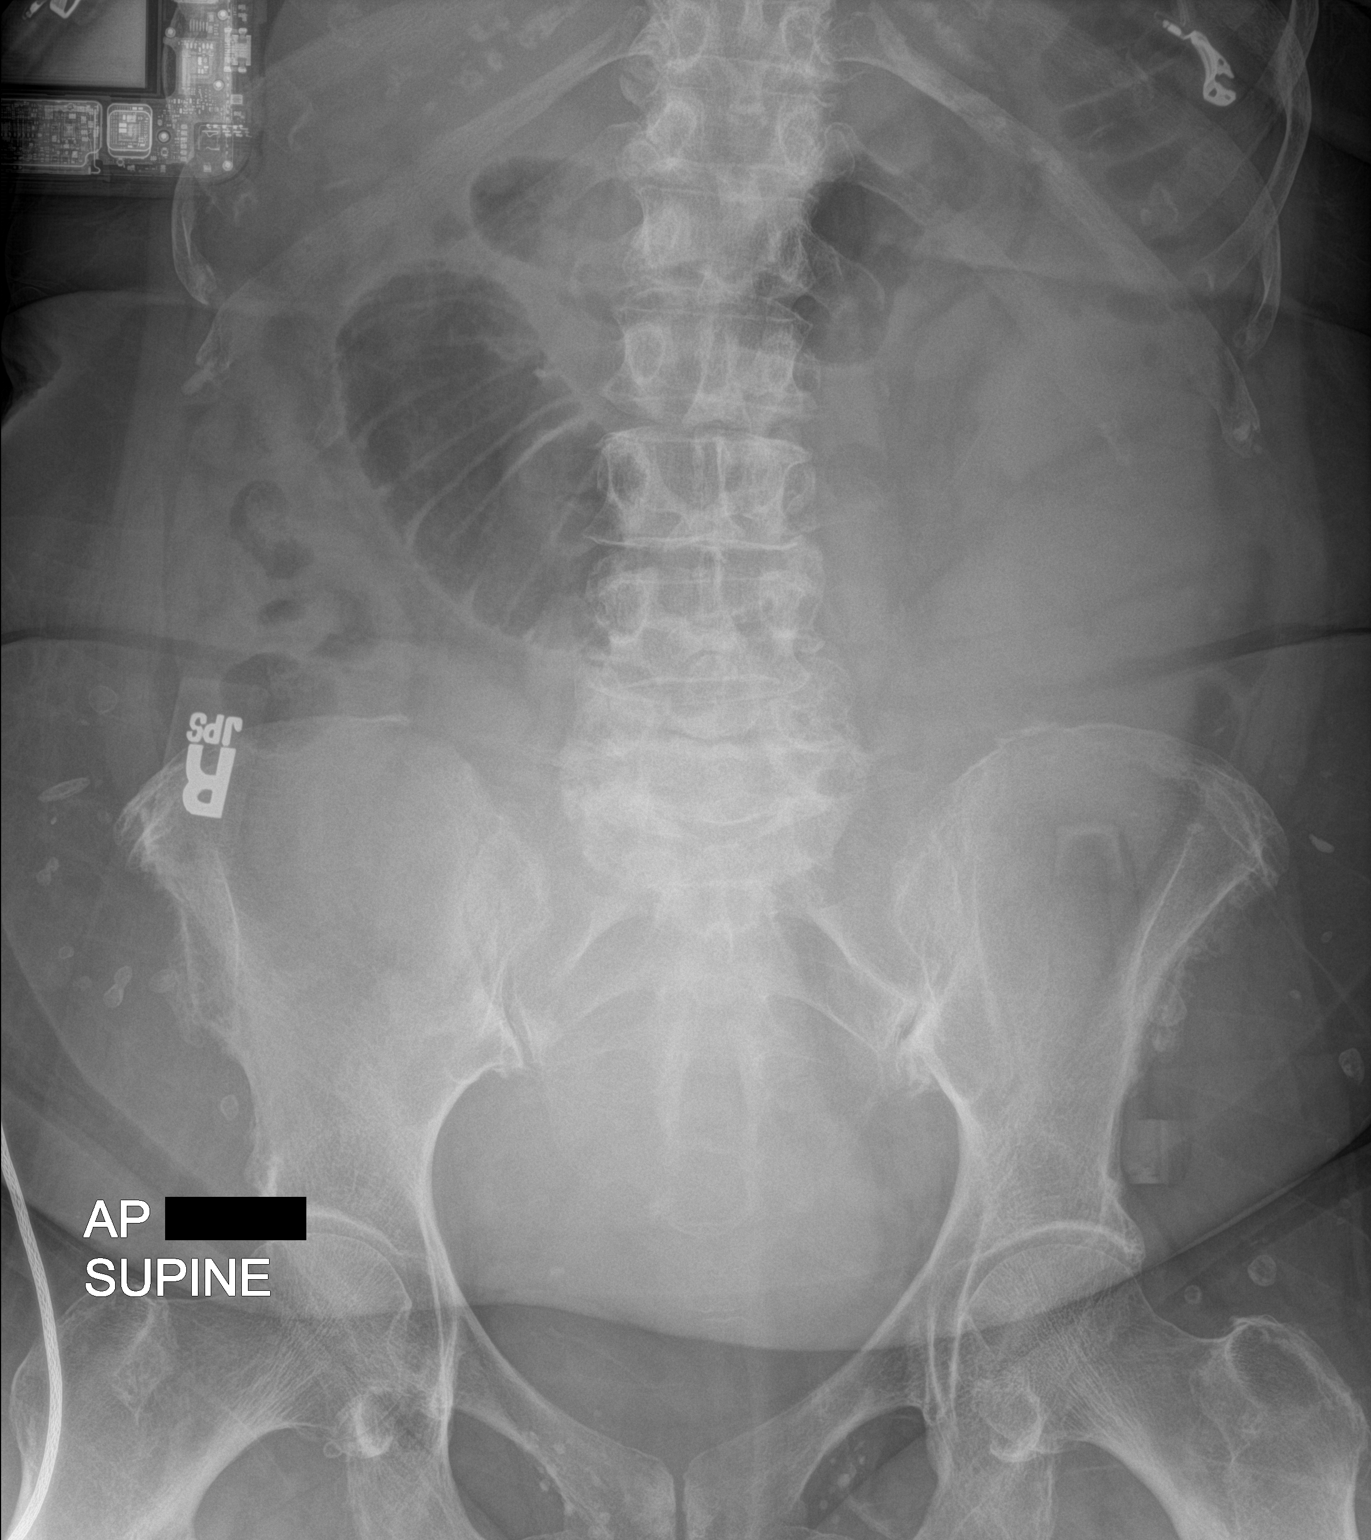

[1 of 1 positions shown; findings below may reference images not displayed]

FINDINGS: Dilated right hemiabdomen small bowel loop measuring up to 5.3 cm.
No evidence of pneumoperitoneum. Multilevel spondylosis. Bilateral
body wall calcified granulomas.
IMPRESSION: Dilated right hemiabdomen small bowel loop measuring up to 5.3 cm.

## 2020-05-23 SURGERY — LARYNGOSCOPY, DIAGNOSTIC
Anesthesia: General | Site: Abdomen

## 2020-05-23 MED ORDER — ROCURONIUM BROMIDE 10 MG/ML (PF) SYRINGE
PREFILLED_SYRINGE | INTRAVENOUS | Status: DC | PRN
  Administered 2020-05-23: 20 mg via INTRAVENOUS
  Administered 2020-05-23: 50 mg via INTRAVENOUS

## 2020-05-23 MED ORDER — FENTANYL CITRATE (PF) 250 MCG/5ML IJ SOLN
INTRAMUSCULAR | Status: AC
  Filled 2020-05-23: qty 5

## 2020-05-23 MED ORDER — LIDOCAINE 2% (20 MG/ML) 5 ML SYRINGE
INTRAMUSCULAR | Status: AC
  Filled 2020-05-23: qty 5

## 2020-05-23 MED ORDER — ACETAMINOPHEN 10 MG/ML IV SOLN: 1000.0000 mg | Freq: Once | INTRAVENOUS | Status: DC | PRN

## 2020-05-23 MED ORDER — PROMETHAZINE HCL 25 MG/ML IJ SOLN: 6.2500 mg | INTRAMUSCULAR | Status: DC | PRN

## 2020-05-23 MED ORDER — ALBUMIN HUMAN 5 % IV SOLN: INTRAVENOUS | Status: DC | PRN

## 2020-05-23 MED ORDER — EPHEDRINE 5 MG/ML INJ
INTRAVENOUS | Status: AC
  Filled 2020-05-23: qty 10

## 2020-05-23 MED ORDER — POTASSIUM CHLORIDE 10 MEQ/50ML IV SOLN
10.0000 meq | INTRAVENOUS | Status: AC
Start: 2020-05-23 — End: 2020-05-23
  Administered 2020-05-23 (×4): 10 meq via INTRAVENOUS
  Filled 2020-05-23 (×4): qty 50

## 2020-05-23 MED ORDER — SUCCINYLCHOLINE CHLORIDE 200 MG/10ML IV SOSY
PREFILLED_SYRINGE | INTRAVENOUS | Status: DC | PRN
  Administered 2020-05-23: 100 mg via INTRAVENOUS

## 2020-05-23 MED ORDER — ONDANSETRON HCL 4 MG/2ML IJ SOLN
INTRAMUSCULAR | Status: DC | PRN
  Administered 2020-05-23: 4 mg via INTRAVENOUS

## 2020-05-23 MED ORDER — POTASSIUM CHLORIDE 10 MEQ/100ML IV SOLN
10.0000 meq | Freq: Once | INTRAVENOUS | Status: AC
  Administered 2020-05-23: 10 meq via INTRAVENOUS
  Filled 2020-05-23: qty 100

## 2020-05-23 MED ORDER — ONDANSETRON HCL 4 MG/2ML IJ SOLN
INTRAMUSCULAR | Status: AC
  Filled 2020-05-23: qty 2

## 2020-05-23 MED ORDER — PROPOFOL 10 MG/ML IV BOLUS
INTRAVENOUS | Status: AC
  Filled 2020-05-23: qty 20

## 2020-05-23 MED ORDER — SUCCINYLCHOLINE CHLORIDE 200 MG/10ML IV SOSY
PREFILLED_SYRINGE | INTRAVENOUS | Status: AC
  Filled 2020-05-23: qty 10

## 2020-05-23 MED ORDER — LIDOCAINE 2% (20 MG/ML) 5 ML SYRINGE
INTRAMUSCULAR | Status: DC | PRN
  Administered 2020-05-23: 80 mg via INTRAVENOUS

## 2020-05-23 MED ORDER — TRAVASOL 10 % IV SOLN
INTRAVENOUS | Status: AC
  Filled 2020-05-23: qty 950.4

## 2020-05-23 MED ORDER — CEFAZOLIN SODIUM-DEXTROSE 2-4 GM/100ML-% IV SOLN
2.0000 g | INTRAVENOUS | Status: AC
  Administered 2020-05-23: 2 g via INTRAVENOUS

## 2020-05-23 MED ORDER — PROPOFOL 10 MG/ML IV BOLUS
INTRAVENOUS | Status: DC | PRN
  Administered 2020-05-23: 120 mg via INTRAVENOUS

## 2020-05-23 MED ORDER — PHENYLEPHRINE 40 MCG/ML (10ML) SYRINGE FOR IV PUSH (FOR BLOOD PRESSURE SUPPORT)
PREFILLED_SYRINGE | INTRAVENOUS | Status: AC
  Filled 2020-05-23: qty 10

## 2020-05-23 MED ORDER — PHENYLEPHRINE 40 MCG/ML (10ML) SYRINGE FOR IV PUSH (FOR BLOOD PRESSURE SUPPORT)
PREFILLED_SYRINGE | INTRAVENOUS | Status: DC | PRN
  Administered 2020-05-23: 240 ug via INTRAVENOUS
  Administered 2020-05-23 (×2): 80 ug via INTRAVENOUS
  Administered 2020-05-23: 120 ug via INTRAVENOUS
  Administered 2020-05-23 (×3): 80 ug via INTRAVENOUS

## 2020-05-23 MED ORDER — 0.9 % SODIUM CHLORIDE (POUR BTL) OPTIME
TOPICAL | Status: DC | PRN
  Administered 2020-05-23 (×2): 1000 mL

## 2020-05-23 MED ORDER — ESMOLOL HCL 100 MG/10ML IV SOLN
INTRAVENOUS | Status: DC | PRN
  Administered 2020-05-23: 10 ug via INTRAVENOUS

## 2020-05-23 MED ORDER — DEXAMETHASONE SODIUM PHOSPHATE 10 MG/ML IJ SOLN
INTRAMUSCULAR | Status: DC | PRN
  Administered 2020-05-23: 5 mg via INTRAVENOUS

## 2020-05-23 MED ORDER — DEXAMETHASONE SODIUM PHOSPHATE 10 MG/ML IJ SOLN
INTRAMUSCULAR | Status: AC
  Filled 2020-05-23: qty 1

## 2020-05-23 MED ORDER — SUGAMMADEX SODIUM 200 MG/2ML IV SOLN
INTRAVENOUS | Status: DC | PRN
  Administered 2020-05-23: 400 mg via INTRAVENOUS

## 2020-05-23 MED ORDER — FENTANYL CITRATE (PF) 100 MCG/2ML IJ SOLN
25.0000 ug | INTRAMUSCULAR | Status: DC | PRN
  Administered 2020-05-23 (×3): 25 ug via INTRAVENOUS

## 2020-05-23 MED ORDER — ROCURONIUM BROMIDE 10 MG/ML (PF) SYRINGE
PREFILLED_SYRINGE | INTRAVENOUS | Status: AC
  Filled 2020-05-23: qty 10

## 2020-05-23 MED ORDER — BUPIVACAINE HCL (PF) 0.25 % IJ SOLN
INTRAMUSCULAR | Status: AC
  Filled 2020-05-23: qty 30

## 2020-05-23 MED ORDER — OXYCODONE HCL 5 MG PO TABS: 5.0000 mg | ORAL_TABLET | Freq: Once | ORAL | Status: DC | PRN

## 2020-05-23 MED ORDER — FENTANYL CITRATE (PF) 100 MCG/2ML IJ SOLN
INTRAMUSCULAR | Status: AC
  Administered 2020-05-23: 25 ug via INTRAVENOUS
  Filled 2020-05-23: qty 2

## 2020-05-23 MED ORDER — FENTANYL CITRATE (PF) 250 MCG/5ML IJ SOLN
INTRAMUSCULAR | Status: DC | PRN
  Administered 2020-05-23 (×2): 25 ug via INTRAVENOUS
  Administered 2020-05-23: 50 ug via INTRAVENOUS
  Administered 2020-05-23 (×3): 25 ug via INTRAVENOUS

## 2020-05-23 MED ORDER — ACETAMINOPHEN 10 MG/ML IV SOLN
1000.0000 mg | Freq: Four times a day (QID) | INTRAVENOUS | Status: DC
  Administered 2020-05-24 (×2): 1000 mg via INTRAVENOUS
  Filled 2020-05-23 (×3): qty 100

## 2020-05-23 MED ORDER — OXYCODONE HCL 5 MG/5ML PO SOLN: 5.0000 mg | Freq: Once | ORAL | Status: DC | PRN

## 2020-05-23 MED ORDER — LACTATED RINGERS IV SOLN: INTRAVENOUS | Status: DC | PRN

## 2020-05-23 MED ORDER — PHENYLEPHRINE HCL-NACL 10-0.9 MG/250ML-% IV SOLN
INTRAVENOUS | Status: DC | PRN
  Administered 2020-05-23: 40 ug/min via INTRAVENOUS

## 2020-05-23 SURGICAL SUPPLY — 72 items
ADH SKN CLS APL DERMABOND .7 (GAUZE/BANDAGES/DRESSINGS) ×1
APL PRP STRL LF DISP 70% ISPRP (MISCELLANEOUS) ×1
APPLIER CLIP 5 13 M/L LIGAMAX5 (MISCELLANEOUS)
APR CLP MED LRG 5 ANG JAW (MISCELLANEOUS)
BLADE CLIPPER SURG (BLADE) IMPLANT
BLADE SURG 10 STRL SS (BLADE) ×1 IMPLANT
CANISTER SUCT 3000ML PPV (MISCELLANEOUS) IMPLANT
CHLORAPREP W/TINT 26 (MISCELLANEOUS) ×2 IMPLANT
CLIP APPLIE 5 13 M/L LIGAMAX5 (MISCELLANEOUS) IMPLANT
CLIP VESOCCLUDE LG 6/CT (CLIP) ×2 IMPLANT
CLIP VESOCCLUDE MED 6/CT (CLIP) ×2 IMPLANT
CLIP VESOCCLUDE SM WIDE 6/CT (CLIP) ×2 IMPLANT
COVER SURGICAL LIGHT HANDLE (MISCELLANEOUS) ×2 IMPLANT
COVER WAND RF STERILE (DRAPES) ×2 IMPLANT
DERMABOND ADVANCED (GAUZE/BANDAGES/DRESSINGS) ×1
DERMABOND ADVANCED .7 DNX12 (GAUZE/BANDAGES/DRESSINGS) ×1 IMPLANT
DRAPE LAPAROSCOPIC ABDOMINAL (DRAPES) ×2 IMPLANT
DRAPE WARM FLUID 44X44 (DRAPES) ×2 IMPLANT
DRSG OPSITE POSTOP 4X10 (GAUZE/BANDAGES/DRESSINGS) ×1 IMPLANT
DRSG OPSITE POSTOP 4X8 (GAUZE/BANDAGES/DRESSINGS) IMPLANT
ELECT BLADE 6.5 EXT (BLADE) IMPLANT
ELECT CAUTERY BLADE 6.4 (BLADE) ×2 IMPLANT
ELECT REM PT RETURN 9FT ADLT (ELECTROSURGICAL) ×2
ELECTRODE REM PT RTRN 9FT ADLT (ELECTROSURGICAL) ×1 IMPLANT
GLOVE BIO SURGEON STRL SZ7 (GLOVE) ×2 IMPLANT
GLOVE BIO SURGEON STRL SZ7.5 (GLOVE) ×1 IMPLANT
GLOVE BIOGEL PI IND STRL 7.0 (GLOVE) ×1 IMPLANT
GLOVE BIOGEL PI IND STRL 7.5 (GLOVE) ×1 IMPLANT
GLOVE BIOGEL PI IND STRL 8 (GLOVE) IMPLANT
GLOVE BIOGEL PI INDICATOR 7.0 (GLOVE) ×1
GLOVE BIOGEL PI INDICATOR 7.5 (GLOVE) ×1
GLOVE BIOGEL PI INDICATOR 8 (GLOVE) ×1
GLOVE SURG SS PI 7.0 STRL IVOR (GLOVE) ×2 IMPLANT
GLOVE SURG UNDER POLY LF SZ7 (GLOVE) ×1 IMPLANT
GOWN STRL REUS W/ TWL LRG LVL3 (GOWN DISPOSABLE) ×3 IMPLANT
GOWN STRL REUS W/TWL LRG LVL3 (GOWN DISPOSABLE) ×6
HANDLE SUCTION POOLE (INSTRUMENTS) ×1 IMPLANT
KIT BASIN OR (CUSTOM PROCEDURE TRAY) ×2 IMPLANT
KIT TURNOVER KIT B (KITS) ×2 IMPLANT
LIGASURE IMPACT 36 18CM CVD LR (INSTRUMENTS) IMPLANT
LOOP VESSEL MAXI BLUE (MISCELLANEOUS) IMPLANT
LOOP VESSEL MINI RED (MISCELLANEOUS) IMPLANT
NEEDLE 22X1 1/2 (OR ONLY) (NEEDLE) ×2 IMPLANT
NS IRRIG 1000ML POUR BTL (IV SOLUTION) ×2 IMPLANT
PACK GENERAL/GYN (CUSTOM PROCEDURE TRAY) ×2 IMPLANT
PAD ARMBOARD 7.5X6 YLW CONV (MISCELLANEOUS) ×2 IMPLANT
PENCIL SMOKE EVACUATOR (MISCELLANEOUS) ×2 IMPLANT
SCISSORS LAP 5X35 DISP (ENDOMECHANICALS) IMPLANT
SET IRRIG TUBING LAPAROSCOPIC (IRRIGATION / IRRIGATOR) IMPLANT
SET TUBE SMOKE EVAC HIGH FLOW (TUBING) ×2 IMPLANT
SLEEVE ENDOPATH XCEL 5M (ENDOMECHANICALS) ×3 IMPLANT
SPONGE LAP 18X18 RF (DISPOSABLE) IMPLANT
STAPLER VISISTAT 35W (STAPLE) ×3 IMPLANT
SUCTION POOLE HANDLE (INSTRUMENTS) ×2
SUT PDS AB 0 CT1 27 (SUTURE) ×2 IMPLANT
SUT PDS AB 1 TP1 96 (SUTURE) IMPLANT
SUT SILK 2 0 (SUTURE) ×4
SUT SILK 2 0 SH CR/8 (SUTURE) ×3 IMPLANT
SUT SILK 2-0 18XBRD TIE 12 (SUTURE) ×1 IMPLANT
SUT SILK 3 0 (SUTURE) ×4
SUT SILK 3 0 SH CR/8 (SUTURE) ×3 IMPLANT
SUT SILK 3-0 18XBRD TIE 12 (SUTURE) ×1 IMPLANT
TOWEL GREEN STERILE (TOWEL DISPOSABLE) ×2 IMPLANT
TOWEL GREEN STERILE FF (TOWEL DISPOSABLE) ×2 IMPLANT
TRAY FOLEY MTR SLVR 14FR STAT (SET/KITS/TRAYS/PACK) IMPLANT
TRAY FOLEY MTR SLVR 16FR STAT (SET/KITS/TRAYS/PACK) ×2 IMPLANT
TRAY LAPAROSCOPIC MC (CUSTOM PROCEDURE TRAY) ×2 IMPLANT
TROCAR XCEL BLUNT TIP 100MML (ENDOMECHANICALS) IMPLANT
TROCAR XCEL NON-BLD 11X100MML (ENDOMECHANICALS) IMPLANT
TROCAR XCEL NON-BLD 5MMX100MML (ENDOMECHANICALS) ×2 IMPLANT
TUBE CONNECTING 12X1/4 (SUCTIONS) ×1 IMPLANT
YANKAUER SUCT BULB TIP NO VENT (SUCTIONS) ×1 IMPLANT

## 2020-05-23 NOTE — Progress Notes (Signed)
[facility] Progress Note     Subjective: CC-  Patient states that she is feeling better today. She denies any abdominal pain. No n/v. No flatus or BM. No I&Os recorded. Unsure how much came out of NG tube over night, but per RN she only had 250cc out during the day yesterday. WBC 5.4, VSS  Objective: Vital signs in last 24 hours: Temp:  [97.5 F (36.4 C)-98.2 F (36.8 C)] 98.2 F (36.8 C) (01/03 0448) Pulse Rate:  [73-92] 92 (01/03 0448) Resp:  [18-20] 20 (01/03 0448) BP: (139-161)/(82-85) 156/85 (01/03 0448) SpO2:  [97 %-100 %] 97 % (01/03 0448) Last BM Date: 05/17/20  Intake/Output from previous day: 01/02 0701 - 01/03 0700 In: 934.4 [I.V.:928.3; IV Piggyback:6.1] Out: -  Intake/Output this shift: No intake/output data recorded.  PE: Gen:  Alert, NAD, pleasant Pulm: rate and effort normal Abd: Soft, mild distension, nontender, no HSM Skin: no rashes noted, warm and dry  Lab Results:  Recent Labs    05/22/20 1800 05/23/20 0404  WBC 6.8 5.4  HGB 13.3 13.3  HCT 41.6 41.5  PLT 237 245   BMET Recent Labs    05/22/20 1750 05/23/20 0404  NA 144 144  K 3.3* 3.3*  CL 111 110  CO2 22 24  GLUCOSE 101* 163*  BUN 19 21  CREATININE 0.79 0.75  CALCIUM 8.9 8.9   PT/INR No results for input(s): LABPROT, INR in the last 72 hours. CMP     Component Value Date/Time   NA 144 05/23/2020 0404   K 3.3 (L) 05/23/2020 0404   CL 110 05/23/2020 0404   CO2 24 05/23/2020 0404   GLUCOSE 163 (H) 05/23/2020 0404   BUN 21 05/23/2020 0404   CREATININE 0.75 05/23/2020 0404   CALCIUM 8.9 05/23/2020 0404   PROT 6.2 (L) 05/23/2020 0404   PROT 7.7 06/07/2017 0940   ALBUMIN 2.8 (L) 05/23/2020 0404   AST 18 05/23/2020 0404   ALT 14 05/23/2020 0404   ALKPHOS 59 05/23/2020 0404   BILITOT 0.8 05/23/2020 0404   GFRNONAA >60 05/23/2020 0404   GFRAA >60 08/15/2017 0012   Lipase     Component Value Date/Time   LIPASE 24 05/17/2020 1756        Studies/Results: DG Abd 1 View  Result Date: 05/21/2020 CLINICAL DATA:  80 year old female with small bowel obstruction. EXAM: ABDOMEN - 1 VIEW COMPARISON:  CT Abdomen and Pelvis 05/17/2020. Postcontrast abdominal radiographs 05/19/2020. FINDINGS: Portable AP supine views since 05/19/2020 the oral contrast has become dilute throughout the abdomen and pelvis. Still, faint contrast is visible within a dilated left abdominal loop seen previously (arrow). And there is no convincing large bowel contrast. Stable enteric tube visible in the epigastrium. Stable abdominal and pelvic visceral contours. Stable visualized osseous structures. At 1313 hours. IMPRESSION: 1. Persistent small bowel obstruction, with no evidence of improvement since 05/19/2020. 2. Stable enteric tube. Electronically Signed   By: Genevie Ann M.D.   On: 05/21/2020 13:31   DG Abd Portable 1V  Result Date: 05/22/2020 CLINICAL DATA:  Vomiting, abdominal pain EXAM: PORTABLE ABDOMEN - 1 VIEW COMPARISON:  05/21/2020 FINDINGS: NG tube remains in the stomach. Decreasing bowel distension. No organomegaly or free air. Small left pleural effusion noted with left base atelectasis. IMPRESSION: Improving bowel gas pattern with decreasing bowel distention since prior study. NG tube remains in the stomach. Electronically Signed   By: Rolm Baptise M.D.   On: 05/22/2020 11:33   [place] EKG SITE RITE  Result Date: 05/22/2020 If Site Rite image not attached, placement could not be confirmed due to current cardiac rhythm.   Anti-infectives: Anti-infectives (From admission, onward)   None       Assessment/Plan HTN HLD GERD Asthma LLL lung mass - noted on CT, concerning for primary bronchogenic malignancy Enlarged thyroid COVDPositive Malnourished/deconditioned - prealbumin 15, started TPN  SBO -PSH: tubal ligation, hysterectomy,Hx exploratory laparotomy,lysis of adhesions, 04/18/2015, DR. Rosendo Gros - CT 12/28 shows [facility] transition  point in the mid central abdomen, likely due to adhesions; small volume of non organized free fluid in the abdomen and pelvis; no perforation or evidence of ischemia  ID -none FEN -IVF, NPO/NGT to LIWS; TPN  VTE -SCDs, lovenox Foley -none Follow up -TBD   Plan: Xray pending this morning. No return in bowel function but NG tube output seems to be down, continue NGT to LIWS. We will see what her film looks like today. She does not need to go to the operating room emergently, but she also does not seem to be opening up so we will likely need to consider surgery soon.  Will discuss timing with MD. Continue TPN.   LOS: 6 days    [name], [facility] 05/23/2020, 8:26 AM Please see Amion for pager number during day hours 7:00am-4:30pm

## 2020-05-23 NOTE — Anesthesia Procedure Notes (Signed)
Procedure Name: Intubation Date/Time: 05/23/2020 3:51 PM Performed by: Dorthea Cove, CRNA Pre-anesthesia Checklist: Patient identified, Emergency Drugs available, Suction available, Patient being monitored and Timeout performed Patient Re-evaluated:Patient Re-evaluated prior to induction Oxygen Delivery Method: Circle system utilized Preoxygenation: Pre-oxygenation with 100% oxygen Induction Type: IV induction, Rapid sequence and Cricoid Pressure applied Laryngoscope Size: Glidescope and 3 Grade View: Grade I Tube type: Oral Tube size: 7.0 mm Number of attempts: 1 Airway Equipment and Method: Stylet and Video-laryngoscopy Placement Confirmation: ETT inserted through vocal cords under direct vision,  breath sounds checked- equal and bilateral and CO2 detector Secured at: 19 cm Tube secured with: Tape Dental Injury: Teeth and Oropharynx as per pre-operative assessment

## 2020-05-23 NOTE — Progress Notes (Signed)
Patient picking at area around incision staples. Patient attempted to remove upper left staple from abdomen. Patient instructed not to pick at wound site. Patient found to be A&Ox2 (disoriented to time and situation. Patient repeating "Thank you Jesus for Saving me, they were trying to kill me". MD paged.

## 2020-05-23 NOTE — Transfer of Care (Signed)
Immediate Anesthesia Transfer of Care Note  Patient: Angelica [name]  Procedure(s) Performed: ATTEMPTED DIAGNOSTIC LAPAROSCOPY WITH ADHESIONS (N/A Abdomen) EXPLORATORY LAPAROTOMY (N/A Abdomen) LYSIS OF ADHESION (N/A Abdomen) SMALL BOWEL REPAIR (N/A Abdomen)  Patient Location: PACU and COVID + therefore Pt recovering in OR 1  Anesthesia Type:General  Level of Consciousness: awake, alert  and oriented  Airway & Oxygen Therapy: Patient Spontanous Breathing  Post-op Assessment: Report given to RN and Post -op Vital signs reviewed and stable  Post vital signs: Reviewed and stable  Last Vitals:  Vitals Value Taken Time  BP 126/95 05/23/20 1619  Temp    Pulse 81 05/23/20 1620  Resp    SpO2 95 % 05/23/20 1620  Vitals shown include unvalidated device data.  Last Pain:  Vitals:   05/23/20 1316  TempSrc: Oral  PainSc:       Patients Stated Pain Goal: 2 (40/37/09 6438)  Complications: No complications documented.

## 2020-05-23 NOTE — Progress Notes (Signed)
PHARMACY - TOTAL PARENTERAL NUTRITION CONSULT NOTE   Indication: Small bowel obstruction  Patient Measurements: Height: 5\' 5"  (165.1 cm) Weight: 79.6 kg (175 lb 7.8 oz) IBW/kg (Calculated) : 57   Body mass index is 29.2 kg/m. Usual Weight:   Assessment: Angelica [name] who presented on 12/28 with abd pain, N/V and was found via KUB and Abd CT showing SBO thought to be due to adhesions. The patient previously had a SBO back in 2016 which required surgery to correct. Concerned for the same this admission. D#6 of NPO, NGT placed for decompression, bowel rest, pre-albumin low at 15.6 - pharmacy consulted for TPN initiation.   Glucose / Insulin: CBGs<180 - no SSI Electrolytes: Na 144, K 3.3, HCO3 24. Labs ordered for 1/2 however per discussion with RN, pt is hard stick and unlikely these can be drawn until PICC placed.  Renal: SCr 0.75, BUN 21 LFTs / TGs: LFTs wnl, TG 97 Prealbumin / albumin: Pre-albumin 15.6 Intake / Output; MIVF: NGT output/24h: 0 cc documented, UOP not accurately charted, on NS @ 40 cc/hr GI Imaging: 12/28 Abd CT: SBO likely d/t adhesions, no perf or evidence of ischemia 1/1 Abd X-ray: Persistent SBO w/ no evidence of improvement 1/2 Abd, x-ray - Improving bowel gas pattern with decreasing bowel distention since prior study. Surgeries / Procedures: NGT placed 12/28  Central access: PICC placed 1/2  TPN start date: 1/2  Nutritional Goals (per RD recommendation on 05/19/20): kCal: 1800-1950, Protein: 85-95, Fluid: >/=1.8L/day Goal TPN rate is 75 mL/hr (provides 95 g of protein and 1804 kcals per day) Using Intralipid 20% instead of SMOFlipids due to allergies - will only give 2 x per week due to shortage - plan Tuesday and Friday.  Current Nutrition:  NPO  Plan:  Increase TPN to 80mL/hr at 1800 to provide 1673 kcal and 95g protein meeting ~93% of estimated calorie needs and 100% of protein goals.  Give fish, shellfish, and peanut allergies - will use Intralipid emulsion  for this patient - - will only give 2 x per week due to shortage- plan Tuesday and Friday. Electrolytes in TPN: 87mEq/L of Na, 32mEq/L of K, 16mEq/L of Ca, 1mEq/L of Mg, and 31mmol/L of Phos. Cl:Ac ratio 1:1 Add standard MVI and trace elements to TPN Initiate Sensitive q6h SSI and adjust as needed  D/c MIVF at 1800 Monitor TPN labs   KCL 10 meq IV x 4  Thank you for allowing pharmacy to be a part of this patient's care.  Alanda Slim, PharmD, [facility] Clinical Pharmacist Please see AMION for all Pharmacists' Contact Phone Numbers 05/23/2020, 8:11 AM

## 2020-05-23 NOTE — Progress Notes (Signed)
PROGRESS NOTE    Angelica [name]  QIW:[id number] DOB: May 24, 1940 DOA: 05/17/2020 PCP: [name], MD   Brief Narrative: Taken from prior notes. Angelica Ucci Manuelis a 80 y.o.femalewith medical history significant forhypertension and thyroid disease, SBO in 2016, presented to the emergency department 12/20 with abdominal pain, nausea, and vomiting. -Work-up in the ED noted incidental diagnosis of Covid and a CT abdomen pelvis which was concerning for small bowel obstruction with transition in the mid central abdomen likely from adhesions in addition CT chest noted left lower lobe lung mass. -General surgery following, failed small bowel protocol.  Will be going to the OR later today. -Remains asymptomatic from Covid  Subjective: Belly pain better with current dilaudid,no N/V,continue to have green secretions with NG, No flatus or BM, getting TPN.  Assessment & Plan:   Principal Problem:   SBO (small bowel obstruction) (HCC) Active Problems:   HTN (hypertension)   Small bowel obstruction (HCC)   Asthma, severe persistent, well-controlled   Pulmonary nodule  Persistent SBO.  Likely secondary to adhesions from prior surgery.  Required surgery during prior episode of SBO.  Initial CT noted transition points in mid central abdomen.  Repeat DG abdomen today without any significant improvement.  General surgery is following-she might need surgery again but they want to follow-up for next couple of days before making final decision. PICC line was placed on 05/22/2020 and she was started on TPN. -Continue with NG tube. -Continue with Dilaudid for pain management. -Going for OR later today.  Left lower lobe lung mass  Incidentally noted on imaging, day of admission -With mediastinal and hilar lymphadenopathy concerning for primary bronchogenic carcinoma -Will request pulmonary evaluation once SBO has resolved for biopsy  SARS COVID-19 infection -Asymptomatic, not hypoxic, chest x-ray  clear, she is vaccinated. Mildly elevated D-dimer, CRP within normal limit. -Given monoclonal antibody infusion on 12/29 -Continue to monitor  Asthma -No wheezing, continue albuterol PRN  Hypertension.  Blood pressure mildly elevated, patient was in pain. -Continue to monitor.  Objective: Vitals:   05/22/20 0510 05/22/20 1556 05/22/20 2119 05/23/20 0448  BP: (!) 136/96 139/82 (!) 161/84 (!) 156/85  Pulse: 84 74 73 92  Resp: 18 18 20 20   Temp: 98.9 F (37.2 C) 98.2 F (36.8 C) (!) 97.5 F (36.4 C) 98.2 F (36.8 C)  TempSrc: Oral  Axillary Oral  SpO2: 100% 100% 100% 97%  Weight:      Height:        Intake/Output Summary (Last 24 hours) at 05/23/2020 1103 Last data filed at 05/23/2020 0935 Gross per 24 hour  Intake 934.38 ml  Output --  Net 934.38 ml   Filed Weights   05/19/20 0105  Weight: 79.6 kg    Examination:  General. Chronically ill appearing,frail lady,in no acute distress.    In no acute distress. Pulmonary.  Lungs clear bilaterally, normal respiratory effort. CV.  Regular rate and rhythm, no JVD, rub or murmur. Abdomen. Distended, mild diffuse tenderness,BS hypoactive CNS.  Alert and oriented x3.  No focal neurologic deficit. Extremities.  No edema, no cyanosis, pulses intact and symmetrical. Psychiatry.  Judgment and insight appears normal.  DVT prophylaxis: Lovenox Code Status: Full Family Communication: daughter was updated on phone. Disposition Plan:  Status is: Inpatient  Remains inpatient appropriate because:Inpatient level of care appropriate due to severity of illness   Dispo: The patient is from: Home              Anticipated d/c is to:  To be determined              Anticipated d/c date is: 3 days              Patient currently is not medically stable to d/c.   Consultants:   General surgery  Procedures:  Antimicrobials:   Data Reviewed: I have personally reviewed following labs and imaging studies  CBC: Recent Labs  Lab  05/18/20 0319 05/19/20 0915 05/20/20 0040 05/22/20 1800 05/23/20 0404  WBC 10.1 7.3 8.4 6.8 5.4  NEUTROABS  --   --   --  4.5 3.2  HGB 16.0* 16.3* 15.8* 13.3 13.3  HCT 47.0* 46.9* 46.1* 41.6 41.5  MCV 89.5 88.2 88.7 92.4 93.0  PLT 295 293 219 237 102   Basic Metabolic Panel: Recent Labs  Lab 05/18/20 0319 05/19/20 0706 05/20/20 0040 05/22/20 1750 05/23/20 0404  NA 136 137 139 144 144  K 3.5 4.2 3.9 3.3* 3.3*  CL 102 104 107 111 110  CO2 22 20* 20* 22 24  GLUCOSE 191* 145* 112* 101* 163*  BUN 8 12 14 19 21   CREATININE 0.87 0.86 0.92 0.79 0.75  CALCIUM 9.5 9.4 9.0 8.9 8.9  MG  --   --   --  2.1 2.1  PHOS  --   --   --  2.9 2.5   GFR: Estimated Creatinine Clearance: 58.4 mL/min (by C-G formula based on SCr of 0.75 mg/dL). Liver Function Tests: Recent Labs  Lab 05/17/20 1756 05/22/20 1750 05/23/20 0404  AST 28 18 18   ALT 15 13 14   ALKPHOS 91 64 59  BILITOT 0.5 0.9 0.8  PROT 8.0 6.3* 6.2*  ALBUMIN 4.0 2.9* 2.8*   Recent Labs  Lab 05/17/20 1756  LIPASE 24   No results for input(s): AMMONIA in the last 168 hours. Coagulation Profile: No results for input(s): INR, PROTIME in the last 168 hours. Cardiac Enzymes: No results for input(s): CKTOTAL, CKMB, CKMBINDEX, TROPONINI in the last 168 hours. BNP (last 3 results) No results for input(s): PROBNP in the last 8760 hours. HbA1C: No results for input(s): HGBA1C in the last 72 hours. CBG: Recent Labs  Lab 05/22/20 1734 05/23/20 0008 05/23/20 0617  GLUCAP 74 146* 145*   Lipid Profile: Recent Labs    05/22/20 1750 05/23/20 0404  TRIG 91 97   Thyroid Function Tests: No results for input(s): TSH, T4TOTAL, FREET4, T3FREE, THYROIDAB in the last 72 hours. Anemia Panel: No results for input(s): VITAMINB12, FOLATE, FERRITIN, TIBC, IRON, RETICCTPCT in the last 72 hours. Sepsis Labs: No results for input(s): PROCALCITON, LATICACIDVEN in the last 168 hours.  Recent Results (from the past 240 hour(s))  Resp  Panel by RT-PCR (Flu A&B, Covid) Nasopharyngeal Swab     Status: Abnormal   Collection Time: 05/17/20  8:48 PM   Specimen: Nasopharyngeal Swab; Nasopharyngeal(NP) swabs in vial transport medium  Result Value Ref Range Status   SARS Coronavirus 2 by RT PCR POSITIVE (A) NEGATIVE Final    Comment: RESULT CALLED TO, READ BACK BY AND VERIFIED WITH: J NEWTON RN 05/17/20 2213 JDW (NOTE) SARS-CoV-2 target nucleic acids are DETECTED.  The SARS-CoV-2 RNA is generally detectable in upper respiratory specimens during the acute phase of infection. Positive results are indicative of the presence of the identified virus, but do not rule out bacterial infection or co-infection with other pathogens not detected by the test. Clinical correlation with patient history and other diagnostic information is necessary to determine patient infection status.  The expected result is Negative.  Fact Sheet for Patients: EntrepreneurPulse.com.au  Fact Sheet for Healthcare Providers: IncredibleEmployment.be  This test is not yet approved or cleared by the [place] FDA and  has been authorized for detection and/or diagnosis of SARS-CoV-2 by FDA under an Emergency Use Authorization (EUA).  This EUA will remain in effect (meaning this test can be use d) for the duration of  the COVID-19 declaration under Section 564(b)(1) of the Act, 21 U.S.C. section 360bbb-3(b)(1), unless the authorization is terminated or revoked sooner.     Influenza A by PCR NEGATIVE NEGATIVE Final   Influenza B by PCR NEGATIVE NEGATIVE Final    Comment: (NOTE) The Xpert Xpress SARS-CoV-2/FLU/RSV plus assay is intended as an aid in the diagnosis of influenza from Nasopharyngeal swab specimens and should not be used as a sole basis for treatment. Nasal washings and aspirates are unacceptable for Xpert Xpress SARS-CoV-2/FLU/RSV testing.  Fact Sheet for  Patients: EntrepreneurPulse.com.au  Fact Sheet for Healthcare Providers: IncredibleEmployment.be  This test is not yet approved or cleared by the [place] FDA and has been authorized for detection and/or diagnosis of SARS-CoV-2 by FDA under an Emergency Use Authorization (EUA). This EUA will remain in effect (meaning this test can be used) for the duration of the COVID-19 declaration under Section 564(b)(1) of the Act, 21 U.S.C. section 360bbb-3(b)(1), unless the authorization is terminated or revoked.  Performed at [facility], [place] [address] [place], [place] [address]      Radiology Studies: DG Abd 1 View  Result Date: 05/21/2020 CLINICAL DATA:  80 year old female with small bowel obstruction. EXAM: ABDOMEN - 1 VIEW COMPARISON:  CT Abdomen and Pelvis 05/17/2020. Postcontrast abdominal radiographs 05/19/2020. FINDINGS: Portable AP supine views since 05/19/2020 the oral contrast has become dilute throughout the abdomen and pelvis. Still, faint contrast is visible within a dilated left abdominal loop seen previously (arrow). And there is no convincing large bowel contrast. Stable enteric tube visible in the epigastrium. Stable abdominal and pelvic visceral contours. Stable visualized osseous structures. At 1313 hours. IMPRESSION: 1. Persistent small bowel obstruction, with no evidence of improvement since 05/19/2020. 2. Stable enteric tube. Electronically Signed   By: Genevie Ann M.D.   On: 05/21/2020 13:31   DG Abd Portable 1V  Result Date: 05/23/2020 CLINICAL DATA:  SBO EXAM: PORTABLE ABDOMEN - 1 VIEW COMPARISON:  05/22/2020 and prior. FINDINGS: Dilated right hemiabdomen small bowel loop measuring up to 5.3 cm. No evidence of pneumoperitoneum. Multilevel spondylosis. Bilateral body wall calcified granulomas. IMPRESSION: Dilated right hemiabdomen small bowel loop measuring up to 5.3 cm. Electronically Signed   By: Primitivo Gauze M.D.   On:  05/23/2020 08:43   DG Abd Portable 1V  Result Date: 05/22/2020 CLINICAL DATA:  Vomiting, abdominal pain EXAM: PORTABLE ABDOMEN - 1 VIEW COMPARISON:  05/21/2020 FINDINGS: NG tube remains in the stomach. Decreasing bowel distension. No organomegaly or free air. Small left pleural effusion noted with left base atelectasis. IMPRESSION: Improving bowel gas pattern with decreasing bowel distention since prior study. NG tube remains in the stomach. Electronically Signed   By: Rolm Baptise M.D.   On: 05/22/2020 11:33   [place] EKG SITE RITE  Result Date: 05/22/2020 If Site Rite image not attached, placement could not be confirmed due to current cardiac rhythm.   Scheduled Meds: . Chlorhexidine Gluconate Cloth  6 each Topical Daily  . enoxaparin (LOVENOX) injection  40 mg Subcutaneous Q24H  . insulin aspart  0-9 Units Subcutaneous Q6H  Continuous Infusions: . sodium chloride    . sodium chloride 40 mL/hr at 05/23/20 1610  . potassium chloride 10 mEq (05/23/20 1030)  . TPN ADULT (ION) 35 mL/hr at 05/23/20 0155  . TPN ADULT (ION)       LOS: 6 days   Time spent: 30 minutes.  Lorella Nimrod, MD [facility]  If 7PM-7AM, please contact night-coverage [email or web address]  05/23/2020, 11:03 AM   This record has been created using Systems analyst. Errors have been sought and corrected,but may not always be located. Such creation errors do not reflect on the standard of care.

## 2020-05-23 NOTE — Progress Notes (Signed)
Patient recovering in OR due to covid PACU RN arrived [phone]

## 2020-05-23 NOTE — Progress Notes (Signed)
Nutrition Follow-up  DOCUMENTATION CODES:   Not applicable  INTERVENTION:  TPN per Pharmacy.   NUTRITION DIAGNOSIS:   Inadequate oral intake related to inability to eat as evidenced by NPO status; ongoing  GOAL:   Patient will meet greater than or equal to 90% of their needs; met with TPN  MONITOR:   Skin,Weight trends,Labs,I & O's,Diet advancement  REASON FOR ASSESSMENT:   Malnutrition Screening Tool    ASSESSMENT:   80 y.o. female with medical history significant for hypertension and thyroid disease, SBO in 2016 presents with abdominal pain, nausea, and vomiting. 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  TPN initiated yesterday. Pt continues on NPO status with NGT to LIWS. NG output 300 ml. Per MD, SBO has not progressed and abdominal x-ray continues to show mid abdominal small bowel distention. Plans for surgical intervention. Plans to continue TPN with advancement to goal rate today to provide 1804 kcal and 95 grams of protein.   Labs and medications reviewed.   Diet Order:   Diet Order            Diet NPO time specified Except for: Ice Chips  Diet effective now                 EDUCATION NEEDS:   Not appropriate for education at this time  Skin:  Skin Assessment: Reviewed RN Assessment  Last BM:  12/28  Height:   Ht Readings from Last 1 Encounters:  05/19/20 _0  (1.651 m)    Weight:   Wt Readings from Last 1 Encounters:  05/19/20 79.6 kg    BMI:  Body mass index is 29.2 kg/m.  Estimated Nutritional Needs:   Kcal:  1800-1950  Protein:  85-95 grams  Fluid:  >/= 1.8 L/day  Corrin Parker, MS, RD, LDN RD pager number/after hours weekend pager number on Amion.

## 2020-05-23 NOTE — Anesthesia Postprocedure Evaluation (Signed)
Anesthesia Post Note  Patient: Angelica [name]  Procedure(s) Performed: ATTEMPTED DIAGNOSTIC LAPAROSCOPY WITH ADHESIONS (N/A Abdomen) EXPLORATORY LAPAROTOMY (N/A Abdomen) LYSIS OF ADHESION (N/A Abdomen) SMALL BOWEL REPAIR (N/A Abdomen)     Patient location during evaluation: PACU Anesthesia Type: General Level of consciousness: awake Pain management: pain level controlled Vital Signs Assessment: post-procedure vital signs reviewed and stable Respiratory status: spontaneous breathing Cardiovascular status: stable Postop Assessment: no apparent nausea or vomiting Anesthetic complications: no   No complications documented.  Last Vitals:  Vitals:   05/23/20 1630 05/23/20 1645  BP: (!) 153/74 (!) 144/90  Pulse: 83 85  Resp: 16 16  Temp:    SpO2: 95% 95%    Last Pain:  Vitals:   05/23/20 1620  TempSrc:   PainSc: Asleep                 Neddie Steedman

## 2020-05-23 NOTE — Op Note (Signed)
Preoperative diagnosis: small bowel obstruction  Postoperative diagnosis: same   Procedure: diagnostic laparoscopic, open lysis of adhesions, enterrorhaphy  Surgeon: Gurney Maxin, M.D.  Asst: Modena Jansky, [facility]  Anesthesia: general  Indications for procedure: Angelica [name] is a 80 y.o. year old female with symptoms of nausea and vomiting. She was diagnosed with small bowel obstruction and put on protocol with bowel rest, gastric decompression and serial exams. She failed to improve and after 5 days was taken for operative exploration.  Description of procedure: The patient was brought into the operative suite. Anesthesia was administered with General endotracheal anesthesia. WHO checklist was applied. The patient was then placed in supine position. The area was prepped and draped in the usual sterile fashion.  Next, a left subcostal incision was made. A 67mm trocar was used to gain access to the peritoneal cavity by optical entry technique. Pneumoperitoneum was applied with a high flow and low pressure. The laparoscope was reinserted to confirm position. There was not a lot of space but enough to see there were dilated loops of small intestine on the left abdomen and decompressed loops on the right a second trocar was placed in the left mid abdomen and omentum was dissected away from the abdominal wall. Because it was difficult to visualize due to domain, a decision to open was made.  A midline incision was made and cautery used to dissect through the subcutaneous tissue and fascia safely. There were multiple large loops of bowel with focal twist of the intestine just right of midline because of omental adhesions. Once these adhesions were released the bowel was able to be rotated to a natural position. There were 2 additional omental adhesions that were removed. The stomach was palpated and no NG tube was present. A new large NG tube was placed with 2 l of succus removed. The small intestine  fluid was pushed back to the stomach with an additional 1 l of fluid removed. This allowed the small intestine to be inspected much better. There were multiple small serosal tears of the dilated small intestine. One area was repaired with interrupted 3-0 silk Lembert sutures. The small intestine was inspected again from ligament of Trietz to the terminal ileum. No perforations were seen. There were a few small adhesions in the distal ileum but nothing causing a twisting or internal hernia. The intestine was placed back into the abdomen. The omentum was put over the intestine. The fascia was closed with 0 PDS in running fashion. Staples were used to close skin. The patient awoke from anesthesia and was transferred back to the floor after recovery. All counts were correct.  Findings: focal adhesive band to small intestine causing obstruction, NG tube initially found not in the esophagus and new NG tube replaced with tip in the body of the stomach  Specimen: none  Implant: none   Blood loss: 100 ml  Local anesthesia: none  Complications: none  Gurney Maxin, M.D. General, Bariatric, & Minimally Invasive Surgery [facility]

## 2020-05-23 NOTE — Anesthesia Preprocedure Evaluation (Addendum)
Anesthesia Evaluation  Patient identified by MRN, date of birth, ID band Patient awake and Patient confused    Reviewed: Allergy & Precautions, NPO status , Patient's Chart, lab work & pertinent test results  History of Anesthesia Complications Negative for: history of anesthetic complications  Airway Mallampati: II  TM Distance: >3 FB Neck ROM: Full    Dental  (+) Poor Dentition, Missing,    Pulmonary asthma , former smoker,    Pulmonary exam normal        Cardiovascular hypertension, Pt. on medications Normal cardiovascular exam     Neuro/Psych negative neurological ROS  negative psych ROS   GI/Hepatic Neg liver ROS, GERD  ,SBO   Endo/Other  negative endocrine ROS  Renal/GU negative Renal ROS  negative genitourinary   Musculoskeletal  (+) Arthritis ,   Abdominal   Peds  Hematology negative hematology ROS (+)   Anesthesia Other Findings Day of surgery medications reviewed with patient.  Reproductive/Obstetrics negative OB ROS                            Anesthesia Physical Anesthesia Plan  ASA: II and emergent  Anesthesia Plan: General   Post-op Pain Management:    Induction: Intravenous, Rapid sequence and Cricoid pressure planned  PONV Risk Score and Plan: 4 or greater and Treatment may vary due to age or medical condition, Ondansetron and Dexamethasone  Airway Management Planned: Oral ETT and Video Laryngoscope Planned  Additional Equipment: None  Intra-op Plan:   Post-operative Plan: Extubation in OR  Informed Consent: I have reviewed the patients History and Physical, chart, labs and discussed the procedure including the risks, benefits and alternatives for the proposed anesthesia with the patient or authorized representative who has indicated his/her understanding and acceptance.     Dental advisory given and Consent reviewed with POA  Plan Discussed with:  CRNA  Anesthesia Plan Comments: (Consent reviewed with patient's daughter via telephone. Daiva Huge, MD)       Anesthesia Quick Evaluation

## 2020-05-24 ENCOUNTER — Encounter (HOSPITAL_COMMUNITY): Payer: Self-pay | Admitting: General Surgery

## 2020-05-24 DIAGNOSIS — K56609 Unspecified intestinal obstruction, unspecified as to partial versus complete obstruction: Secondary | ICD-10-CM | POA: Diagnosis not present

## 2020-05-24 LAB — BASIC METABOLIC PANEL
Anion gap: 8 (ref 5–15)
BUN: 22 mg/dL (ref 8–23)
CO2: 23 mmol/L (ref 22–32)
Calcium: 8.5 mg/dL — ABNORMAL LOW (ref 8.9–10.3)
Chloride: 111 mmol/L (ref 98–111)
Creatinine, Ser: 0.76 mg/dL (ref 0.44–1.00)
GFR, Estimated: >60 mL/min (ref >60)
Glucose, Bld: 191 mg/dL — ABNORMAL HIGH (ref 70–99)
Potassium: 3.6 mmol/L (ref 3.5–5.1)
Sodium: 142 mmol/L (ref 135–145)

## 2020-05-24 LAB — GLUCOSE, CAPILLARY
Glucose-Capillary: 134 mg/dL — ABNORMAL HIGH (ref 70–99)
Glucose-Capillary: 138 mg/dL — ABNORMAL HIGH (ref 70–99)
Glucose-Capillary: 162 mg/dL — ABNORMAL HIGH (ref 70–99)
Glucose-Capillary: 209 mg/dL — ABNORMAL HIGH (ref 70–99)

## 2020-05-24 LAB — PHOSPHORUS: Phosphorus: 1.7 mg/dL — ABNORMAL LOW (ref 2.5–4.6)

## 2020-05-24 LAB — MAGNESIUM: Magnesium: 1.9 mg/dL (ref 1.7–2.4)

## 2020-05-24 MED ORDER — POTASSIUM PHOSPHATES 15 MMOLE/5ML IV SOLN
25.0000 mmol | Freq: Once | INTRAVENOUS | Status: AC
  Administered 2020-05-24: 25 mmol via INTRAVENOUS
  Filled 2020-05-24: qty 8.33

## 2020-05-24 MED ORDER — ACETAMINOPHEN 10 MG/ML IV SOLN
1000.0000 mg | Freq: Four times a day (QID) | INTRAVENOUS | Status: AC
  Administered 2020-05-24 – 2020-05-25 (×3): 1000 mg via INTRAVENOUS
  Filled 2020-05-24 (×4): qty 100

## 2020-05-24 MED ORDER — FENTANYL CITRATE (PF) 100 MCG/2ML IJ SOLN
12.5000 ug | INTRAMUSCULAR | Status: DC | PRN
  Administered 2020-05-24: 25 ug via INTRAVENOUS
  Filled 2020-05-24 (×2): qty 2

## 2020-05-24 MED ORDER — TRAVASOL 10 % IV SOLN
INTRAVENOUS | Status: AC
  Filled 2020-05-24: qty 856.8

## 2020-05-24 MED ORDER — MAGNESIUM SULFATE IN D5W 1-5 GM/100ML-% IV SOLN
1.0000 g | Freq: Once | INTRAVENOUS | Status: AC
  Administered 2020-05-24: 1 g via INTRAVENOUS
  Filled 2020-05-24: qty 100

## 2020-05-24 MED ORDER — HALOPERIDOL LACTATE 5 MG/ML IJ SOLN
INTRAMUSCULAR | Status: AC
  Filled 2020-05-24: qty 1

## 2020-05-24 MED ORDER — HALOPERIDOL LACTATE 5 MG/ML IJ SOLN
5.0000 mg | Freq: Once | INTRAMUSCULAR | Status: AC
  Administered 2020-05-24: 5 mg via INTRAVENOUS

## 2020-05-24 MED ORDER — FENTANYL CITRATE (PF) 100 MCG/2ML IJ SOLN
25.0000 ug | INTRAMUSCULAR | Status: DC | PRN
  Administered 2020-05-24 – 2020-06-01 (×24): 50 ug via INTRAVENOUS
  Filled 2020-05-24 (×28): qty 2

## 2020-05-24 MED ORDER — FAT EMULSION PLANT BASED 20% (INTRALIPID)IV EMUL
240.0000 mL | INTRAVENOUS | Status: AC
  Administered 2020-05-24: 240 mL via INTRAVENOUS
  Filled 2020-05-24: qty 240

## 2020-05-24 MED ORDER — ALTEPLASE 2 MG IJ SOLR
2.0000 mg | Freq: Once | INTRAMUSCULAR | Status: AC
  Administered 2020-05-24: 2 mg

## 2020-05-24 MED ORDER — HALOPERIDOL LACTATE 5 MG/ML IJ SOLN: 3.0000 mg | Freq: Four times a day (QID) | INTRAMUSCULAR | Status: DC | PRN

## 2020-05-24 MED ORDER — METHOCARBAMOL 1000 MG/10ML IJ SOLN
500.0000 mg | Freq: Four times a day (QID) | INTRAVENOUS | Status: DC | PRN
  Administered 2020-05-26: 500 mg via INTRAVENOUS
  Filled 2020-05-24 (×3): qty 5

## 2020-05-24 NOTE — Progress Notes (Signed)
Cross-coverage note:   Patient increasingly delirious, now agitated, pulled NGT out and grabbing at surgical staples. Will need to use soft restraints for now for patient safety, continue frequent reassessments, and remove as soon as possible.

## 2020-05-24 NOTE — Progress Notes (Signed)
[facility] Progress Note  1 Day Post-Op  Subjective: CC-  Events from last night noted. Patient delirious and pulled out NG tube. Resting calmly this morning. She does not remember having surgery yesterday, but can tell me who she is, where she is, and the year.  Abdomen sore but pain well controlled. Mild nausea at times, no emesis. No flatus or BM.   Objective: Vital signs in last 24 hours: Temp:  [97 F (36.1 C)-98.8 F (37.1 C)] 98.8 F (37.1 C) (01/04 0427) Pulse Rate:  [78-110] 85 (01/04 0427) Resp:  [16-20] 16 (01/04 0427) BP: (110-179)/(72-96) 110/74 (01/04 0427) SpO2:  [95 %-98 %] 97 % (01/04 0427) Last BM Date: 05/17/20  Intake/Output from previous day: 01/03 0701 - 01/04 0700 In: 1584.7 [I.V.:1134.7; IV Piggyback:450] Out: 3900 [Urine:825; Emesis/NG output:175; Blood:100] Intake/Output this shift: No intake/output data recorded.  PE: Gen:  Alert, NAD, pleasant Pulm: rate and effort normal Abd: Soft, mild distension, appropriately tender over incision, +BS, midline incision with honeycomb dressing in place and small amount bloody drainage on dressing Skin: no rashes noted, warm and dry  Lab Results:  Recent Labs    05/22/20 1800 05/23/20 0404  WBC 6.8 5.4  HGB 13.3 13.3  HCT 41.6 41.5  PLT 237 245   BMET Recent Labs    05/23/20 0404 05/24/20 0417  NA 144 142  K 3.3* 3.6  CL 110 111  CO2 24 23  GLUCOSE 163* 191*  BUN 21 22  CREATININE 0.75 0.76  CALCIUM 8.9 8.5*   PT/INR No results for input(s): LABPROT, INR in the last 72 hours. CMP     Component Value Date/Time   NA 142 05/24/2020 0417   K 3.6 05/24/2020 0417   CL 111 05/24/2020 0417   CO2 23 05/24/2020 0417   GLUCOSE 191 (H) 05/24/2020 0417   BUN 22 05/24/2020 0417   CREATININE 0.76 05/24/2020 0417   CALCIUM 8.5 (L) 05/24/2020 0417   PROT 6.2 (L) 05/23/2020 0404   PROT 7.7 06/07/2017 0940   ALBUMIN 2.8 (L) 05/23/2020 0404   AST 18 05/23/2020 0404   ALT 14 05/23/2020  0404   ALKPHOS 59 05/23/2020 0404   BILITOT 0.8 05/23/2020 0404   GFRNONAA >60 05/24/2020 0417   GFRAA >60 08/15/2017 0012   Lipase     Component Value Date/Time   LIPASE 24 05/17/2020 1756       Studies/Results: DG Abd Portable 1V  Result Date: 05/23/2020 CLINICAL DATA:  SBO EXAM: PORTABLE ABDOMEN - 1 VIEW COMPARISON:  05/22/2020 and prior. FINDINGS: Dilated right hemiabdomen small bowel loop measuring up to 5.3 cm. No evidence of pneumoperitoneum. Multilevel spondylosis. Bilateral body wall calcified granulomas. IMPRESSION: Dilated right hemiabdomen small bowel loop measuring up to 5.3 cm. Electronically Signed   By: Primitivo Gauze M.D.   On: 05/23/2020 08:43   DG Abd Portable 1V  Result Date: 05/22/2020 CLINICAL DATA:  Vomiting, abdominal pain EXAM: PORTABLE ABDOMEN - 1 VIEW COMPARISON:  05/21/2020 FINDINGS: NG tube remains in the stomach. Decreasing bowel distension. No organomegaly or free air. Small left pleural effusion noted with left base atelectasis. IMPRESSION: Improving bowel gas pattern with decreasing bowel distention since prior study. NG tube remains in the stomach. Electronically Signed   By: Rolm Baptise M.D.   On: 05/22/2020 11:33    Anti-infectives: Anti-infectives (From admission, onward)   Start     Dose/Rate Route Frequency Ordered Stop   05/23/20 1300  ceFAZolin (ANCEF) IVPB 2g/100 mL premix  2 g 200 mL/hr over 30 Minutes Intravenous On call to O.R. 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 Kieth Brightly - POD#1 - Patient pulled out NG tube. Ok to  leave out unless she becomes distended or complains of n/v. Keep NPO, await return in bowel function. Continue TPN until able to reliably tolerate diet. Needs to mobilize. Will ask PT/OT to see. Continue IV tylenol for improved pain control. Attempted to call the patient's daughter, no answer. Will try again later today.   ID -none FEN - NPO, TPN VTE -SCDs, lovenox Foley -none Follow up -staple removal, Dr. Kieth Brightly   LOS: 7 days    [name], [facility] 05/24/2020, 8:33 AM Please see Amion for pager number during day hours 7:00am-4:30pm

## 2020-05-24 NOTE — Evaluation (Signed)
Physical Therapy Evaluation Patient Details Name: Angelica [name] MRN: [id number] DOB: May 11, 1941 Today's Date: 05/24/2020   History of Present Illness  Pt adm with SBO. Also found incidental Covid+. Underwent diagnostic laparoscopy, open lysis of adhesions and enterrorhaphy on 05/23/20. PMH - HTN, SBO, thyroid disease, asthma.  Clinical Impression  Pt admitted with above diagnosis and presents to PT with functional limitations due to deficits listed below (See PT problem list). Pt needs skilled PT to maximize independence and safety to allow discharge to home with family. Expect mobility will progress well as pain decreases and pt more awake.      Follow Up Recommendations Home health PT;Supervision/Assistance - 24 hour    Equipment Recommendations  Other (comment) (rollator)    Recommendations for Other Services       Precautions / Restrictions Precautions Precautions: Fall      Mobility  Bed Mobility Overal bed mobility: Needs Assistance Bed Mobility: Supine to Sit     Supine to sit: Max assist     General bed mobility comments: Assist to bring legs off of bed, elevate trunk into sitting and bring hips to EOB.    Transfers Overall transfer level: Needs assistance Equipment used: 4-wheeled walker Transfers: Sit to/from Stand Sit to Stand: Mod assist         General transfer comment: Assist to bring hips up and for balance. Verbal cues for hand placement  Ambulation/Gait Ambulation/Gait assistance: Min assist Gait Distance (Feet): 12 Feet (forward and backward) Assistive device: 4-wheeled walker Gait Pattern/deviations: Step-through pattern;Decreased step length - right;Decreased step length - left;Shuffle;Trunk flexed Gait velocity: decr Gait velocity interpretation: <1.31 ft/sec, indicative of household ambulator General Gait Details: Assist for balance and support.  Stairs            Wheelchair Mobility    Modified Rankin (Stroke Patients Only)        Balance Overall balance assessment: Needs assistance Sitting-balance support: No upper extremity supported;Feet supported Sitting balance-Leahy Scale: Fair     Standing balance support: Bilateral upper extremity supported;During functional activity Standing balance-Leahy Scale: Poor Standing balance comment: UE support and min guard for static standing                             Pertinent Vitals/Pain Pain Assessment: Faces Faces Pain Scale: Hurts even more Pain Location: abdomen Pain Descriptors / Indicators: Grimacing;Operative site guarding Pain Intervention(s): Limited activity within patient's tolerance;Monitored during session;Repositioned    Home Living Family/patient expects to be discharged to:: Private residence Living Arrangements: Children Available Help at Discharge: Family;Available 24 hours/day Type of Home: House Home Access: Level entry     Home Layout: One level Home Equipment: None      Prior Function Level of Independence: Independent               Hand Dominance        Extremity/Trunk Assessment   Upper Extremity Assessment Upper Extremity Assessment: Defer to OT evaluation    Lower Extremity Assessment Lower Extremity Assessment: Generalized weakness       Communication   Communication: No difficulties  Cognition Arousal/Alertness: Lethargic Behavior During Therapy: Flat affect Overall Cognitive Status: Impaired/Different from baseline Area of Impairment: Memory;Following commands;Safety/judgement;Problem solving                     Memory: Decreased short-term memory Following Commands: Follows one step commands consistently;Follows one step commands with increased time Safety/Judgement: Decreased awareness  of deficits   Problem Solving: Slow processing;Decreased initiation;Requires verbal cues General Comments: Wakes to stimuli but back to sleep quickly      General Comments General comments  (skin integrity, edema, etc.): VSS on RA    Exercises     Assessment/Plan    PT Assessment Patient needs continued PT services  PT Problem List Decreased strength;Decreased activity tolerance;Decreased balance;Decreased mobility;Decreased cognition;Pain       PT Treatment Interventions DME instruction;Gait training;Functional mobility training;Therapeutic activities;Therapeutic exercise;Balance training;Patient/family education;Cognitive remediation    PT Goals (Current goals can be found in the Care Plan section)  Acute Rehab PT Goals Patient Stated Goal: not stated PT Goal Formulation: With patient Time For Goal Achievement: 06/07/20 Potential to Achieve Goals: Good    Frequency Min 3X/week   Barriers to discharge        Co-evaluation               AM-PAC PT "6 Clicks" Mobility  Outcome Measure Help needed turning from your back to your side while in a flat bed without using bedrails?: A Lot Help needed moving from lying on your back to sitting on the side of a flat bed without using bedrails?: A Lot Help needed moving to and from a bed to a chair (including a wheelchair)?: A Lot Help needed standing up from a chair using your arms (e.g., wheelchair or bedside chair)?: A Lot Help needed to walk in hospital room?: A Little Help needed climbing 3-5 steps with a railing? : Total 6 Click Score: 12    End of Session Equipment Utilized During Treatment: Gait belt Activity Tolerance: Patient limited by lethargy;Patient limited by fatigue Patient left: in chair;with call bell/phone within reach;with chair alarm set;Other (comment) (mitts reapplied) Nurse Communication: Mobility status PT Visit Diagnosis: Other abnormalities of gait and mobility (R26.89);Pain Pain - part of body:  (abdomen)    Time: 0630-1601 PT Time Calculation (min) (ACUTE ONLY): 34 min   Charges:   PT Evaluation $PT Eval Moderate Complexity: 1 Mod PT Treatments $Gait Training: 8-22 mins         [place] Pager [phone] Office [place] 05/24/2020, 6:07 PM

## 2020-05-24 NOTE — Progress Notes (Signed)
Patient found a 0300 attempting to climb out of bed. Patient stated "Y'all want to hurt me", patient started pulling out NG tube. Nurses in room attempted to reorient patient and prevent complete removal of NG tube, patient became very combative by attempting to kick, punch, and spit at nurses. Primary nurse called for aid in attempting to calm patient down to prevent further damage since patient was holding her PICC lines. Charge nurse responded to call arriving in room and paged MD. MD ordered 5 mg of Haldol and administered immediately, which worked effectively at calming patient down. Patient's daughter was called and notified of situation per patient request, patient's daughter answered and was updated on situation. Patient now sleeping.

## 2020-05-24 NOTE — Progress Notes (Signed)
   05/24/20 1038  Assess: MEWS Score  Temp 98.4 F (36.9 C)  BP (!) 139/104  Pulse Rate 90  Resp 16  SpO2 99 %  Assess: MEWS Score  MEWS Temp 0  MEWS Systolic 0  MEWS Pulse 0  MEWS RR 0  MEWS LOC 2  MEWS Score 2  MEWS Score Color Yellow  Assess: if the MEWS score is Yellow or Red  Were vital signs taken at a resting state? Yes  Focused Assessment No change from prior assessment  Early Detection of Sepsis Score *See Row Information* Low  MEWS guidelines implemented *See Row Information* Yes  Treat  MEWS Interventions Escalated (See documentation below)  Take Vital Signs  Increase Vital Sign Frequency  Yellow: Q 2hr X 2 then Q 4hr X 2, if remains yellow, continue Q 4hrs  Escalate  MEWS: Escalate Yellow: discuss with charge nurse/RN and consider discussing with provider and RRT  Notify: Charge Nurse/RN  Name of Charge Nurse/RN Notified erin  Date Charge Nurse/RN Notified 05/24/20  Time Charge Nurse/RN Notified 1045

## 2020-05-24 NOTE — Progress Notes (Signed)
PROGRESS NOTE    Angelica [name]  QIO:[id number] DOB: 1940-12-03 DOA: 05/17/2020 PCP: [name], MD   Brief Narrative: Taken from prior notes. Angelica [name] a 80 y.o.femalewith medical history significant forhypertension and thyroid disease, SBO in 2016, presented to the emergency department 12/20 with abdominal pain, nausea, and vomiting. -Work-up in the ED noted incidental diagnosis of Covid and a CT abdomen pelvis which was concerning for small bowel obstruction with transition in the mid central abdomen likely from adhesions in addition CT chest noted left lower lobe lung mass. -General surgery following, failed small bowel protocol.  Underwent open lysis of adhesions and enterorrhaphy by surgery on 05/23/2020. -Remains asymptomatic from Covid -Remain on TPN  Subjective: Patient pulled her NG tube overnight, no nausea or vomiting.  Daughter was at bedside.  Did not pass flatus or had bowel movement.  Pain seems well controlled.  Assessment & Plan:   Principal Problem:   SBO (small bowel obstruction) (HCC) Active Problems:   HTN (hypertension)   Small bowel obstruction (HCC)   Asthma, severe persistent, well-controlled   Pulmonary nodule  Persistent SBO S/P open lysis of adhesions and enterorrhaphy.   likely secondary to adhesions from prior surgery.  Required surgery during prior episode of SBO.  Initial CT noted transition points in mid central abdomen.  Repeat DG abdomen today without any significant improvement.  PICC line was placed on 05/22/2020 and she was started on TPN. Surgical intervention on 05/23/2020. Pulled NG tube overnight. -Continue with Dilaudid for pain management. -Continue with TPN  Left lower lobe lung mass  Incidentally noted on imaging, day of admission -With mediastinal and hilar lymphadenopathy concerning for primary bronchogenic carcinoma -Will request pulmonary evaluation once SBO has resolved for biopsy  SARS COVID-19  infection -Asymptomatic, not hypoxic, chest x-ray clear, she is vaccinated. Mildly elevated D-dimer, CRP within normal limit. -Given monoclonal antibody infusion on 12/29 -Continue to monitor  Asthma -No wheezing, continue albuterol PRN  Hypertension.  Blood pressure mildly elevated, patient was in pain. -Continue to monitor.  Objective: Vitals:   05/24/20 0013 05/24/20 0427 05/24/20 0753 05/24/20 0850  BP: (!) 179/93 110/74  (!) 132/94  Pulse: (!) 101 85  84  Resp: 20 16    Temp: 97.9 F (36.6 C) 98.8 F (37.1 C)  98 F (36.7 C)  TempSrc: Oral Axillary Oral Oral  SpO2: 98% 97%  98%  Weight:      Height:        Intake/Output Summary (Last 24 hours) at 05/24/2020 0854 Last data filed at 05/24/2020 0441 Gross per 24 hour  Intake 1584.67 ml  Output 3900 ml  Net -2315.33 ml   Filed Weights   05/19/20 0105  Weight: 79.6 kg    Examination:  General.  Frail elderly lady, in no acute distress. Pulmonary.  Lungs clear bilaterally, normal respiratory effort. CV.  Regular rate and rhythm, no JVD, rub or murmur. Abdomen.  Soft, mild diffuse tenderness, nondistended, BS positive. CNS.  Alert and oriented .  No focal neurologic deficit. Extremities.  No edema, no cyanosis, pulses intact and symmetrical. Psychiatry.  Judgment and insight appears normal.  DVT prophylaxis: Lovenox Code Status: Full Family Communication: daughter was updated at bedside. Disposition Plan:  Status is: Inpatient  Remains inpatient appropriate because:Inpatient level of care appropriate due to severity of illness   Dispo: The patient is from: Home              Anticipated d/c is to: To be  determined              Anticipated d/c date is: 3 days              Patient currently is not medically stable to d/c.   Consultants:   General surgery  Procedures:  Antimicrobials:   Data Reviewed: I have personally reviewed following labs and imaging studies  CBC: Recent Labs  Lab  05/18/20 0319 05/19/20 0915 05/20/20 0040 05/22/20 1800 05/23/20 0404  WBC 10.1 7.3 8.4 6.8 5.4  NEUTROABS  --   --   --  4.5 3.2  HGB 16.0* 16.3* 15.8* 13.3 13.3  HCT 47.0* 46.9* 46.1* 41.6 41.5  MCV 89.5 88.2 88.7 92.4 93.0  PLT 295 293 219 237 712   Basic Metabolic Panel: Recent Labs  Lab 05/19/20 0706 05/20/20 0040 05/22/20 1750 05/23/20 0404 05/24/20 0417  NA 137 139 144 144 142  K 4.2 3.9 3.3* 3.3* 3.6  CL 104 107 111 110 111  CO2 20* 20* 22 24 23   GLUCOSE 145* 112* 101* 163* 191*  BUN 12 14 19 21 22   CREATININE 0.86 0.92 0.79 0.75 0.76  CALCIUM 9.4 9.0 8.9 8.9 8.5*  MG  --   --  2.1 2.1 1.9  PHOS  --   --  2.9 2.5 1.7*   GFR: Estimated Creatinine Clearance: 58.4 mL/min (by C-G formula based on SCr of 0.76 mg/dL). Liver Function Tests: Recent Labs  Lab 05/17/20 1756 05/22/20 1750 05/23/20 0404  AST 28 18 18   ALT 15 13 14   ALKPHOS 91 64 59  BILITOT 0.5 0.9 0.8  PROT 8.0 6.3* 6.2*  ALBUMIN 4.0 2.9* 2.8*   Recent Labs  Lab 05/17/20 1756  LIPASE 24   No results for input(s): AMMONIA in the last 168 hours. Coagulation Profile: No results for input(s): INR, PROTIME in the last 168 hours. Cardiac Enzymes: No results for input(s): CKTOTAL, CKMB, CKMBINDEX, TROPONINI in the last 168 hours. BNP (last 3 results) No results for input(s): PROBNP in the last 8760 hours. HbA1C: No results for input(s): HGBA1C in the last 72 hours. CBG: Recent Labs  Lab 05/23/20 0008 05/23/20 0617 05/23/20 1311 05/24/20 0016 05/24/20 0654  GLUCAP 146* 145* 150* 209* 138*   Lipid Profile: Recent Labs    05/22/20 1750 05/23/20 0404  TRIG 91 97   Thyroid Function Tests: No results for input(s): TSH, T4TOTAL, FREET4, T3FREE, THYROIDAB in the last 72 hours. Anemia Panel: No results for input(s): VITAMINB12, FOLATE, FERRITIN, TIBC, IRON, RETICCTPCT in the last 72 hours. Sepsis Labs: No results for input(s): PROCALCITON, LATICACIDVEN in the last 168 hours.  Recent  Results (from the past 240 hour(s))  Resp Panel by RT-PCR (Flu A&B, Covid) Nasopharyngeal Swab     Status: Abnormal   Collection Time: 05/17/20  8:48 PM   Specimen: Nasopharyngeal Swab; Nasopharyngeal(NP) swabs in vial transport medium  Result Value Ref Range Status   SARS Coronavirus 2 by RT PCR POSITIVE (A) NEGATIVE Final    Comment: RESULT CALLED TO, READ BACK BY AND VERIFIED WITH: J NEWTON RN 05/17/20 2213 JDW (NOTE) SARS-CoV-2 target nucleic acids are DETECTED.  The SARS-CoV-2 RNA is generally detectable in upper respiratory specimens during the acute phase of infection. Positive results are indicative of the presence of the identified virus, but do not rule out bacterial infection or co-infection with other pathogens not detected by the test. Clinical correlation with patient history and other diagnostic information is necessary to determine patient infection status.  The expected result is Negative.  Fact Sheet for Patients: EntrepreneurPulse.com.au  Fact Sheet for Healthcare Providers: IncredibleEmployment.be  This test is not yet approved or cleared by the [place] FDA and  has been authorized for detection and/or diagnosis of SARS-CoV-2 by FDA under an Emergency Use Authorization (EUA).  This EUA will remain in effect (meaning this test can be use d) for the duration of  the COVID-19 declaration under Section 564(b)(1) of the Act, 21 U.S.C. section 360bbb-3(b)(1), unless the authorization is terminated or revoked sooner.     Influenza A by PCR NEGATIVE NEGATIVE Final   Influenza B by PCR NEGATIVE NEGATIVE Final    Comment: (NOTE) The Xpert Xpress SARS-CoV-2/FLU/RSV plus assay is intended as an aid in the diagnosis of influenza from Nasopharyngeal swab specimens and should not be used as a sole basis for treatment. Nasal washings and aspirates are unacceptable for Xpert Xpress SARS-CoV-2/FLU/RSV testing.  Fact Sheet for  Patients: EntrepreneurPulse.com.au  Fact Sheet for Healthcare Providers: IncredibleEmployment.be  This test is not yet approved or cleared by the [place] FDA and has been authorized for detection and/or diagnosis of SARS-CoV-2 by FDA under an Emergency Use Authorization (EUA). This EUA will remain in effect (meaning this test can be used) for the duration of the COVID-19 declaration under Section 564(b)(1) of the Act, 21 U.S.C. section 360bbb-3(b)(1), unless the authorization is terminated or revoked.  Performed at [facility], [place] [address] [place], [place] [address]      Radiology Studies: DG Abd Portable 1V  Result Date: 05/23/2020 CLINICAL DATA:  SBO EXAM: PORTABLE ABDOMEN - 1 VIEW COMPARISON:  05/22/2020 and prior. FINDINGS: Dilated right hemiabdomen small bowel loop measuring up to 5.3 cm. No evidence of pneumoperitoneum. Multilevel spondylosis. Bilateral body wall calcified granulomas. IMPRESSION: Dilated right hemiabdomen small bowel loop measuring up to 5.3 cm. Electronically Signed   By: Primitivo Gauze M.D.   On: 05/23/2020 08:43   DG Abd Portable 1V  Result Date: 05/22/2020 CLINICAL DATA:  Vomiting, abdominal pain EXAM: PORTABLE ABDOMEN - 1 VIEW COMPARISON:  05/21/2020 FINDINGS: NG tube remains in the stomach. Decreasing bowel distension. No organomegaly or free air. Small left pleural effusion noted with left base atelectasis. IMPRESSION: Improving bowel gas pattern with decreasing bowel distention since prior study. NG tube remains in the stomach. Electronically Signed   By: Rolm Baptise M.D.   On: 05/22/2020 11:33    Scheduled Meds: . Chlorhexidine Gluconate Cloth  6 each Topical Daily  . enoxaparin (LOVENOX) injection  40 mg Subcutaneous Q24H  . haloperidol lactate      . insulin aspart  0-9 Units Subcutaneous Q6H   Continuous Infusions: . sodium chloride Stopped (05/23/20 2130)  . acetaminophen    . fat  emul(INTRAlipid)    . magnesium sulfate bolus IVPB 1 g (05/24/20 0810)  . methocarbamol (ROBAXIN) IV    . potassium PHOSPHATE IVPB (in mmol)    . TPN ADULT (ION) 75 mL/hr at 05/24/20 0441  . TPN ADULT (ION)       LOS: 7 days   Time spent: 30 minutes.  Lorella Nimrod, MD [facility]  If 7PM-7AM, please contact night-coverage [email or web address]  05/24/2020, 8:54 AM   This record has been created using Systems analyst. Errors have been sought and corrected,but may not always be located. Such creation errors do not reflect on the standard of care.

## 2020-05-24 NOTE — Progress Notes (Signed)
Purple port was infusing TNA. Royutine loine care given with NBR. Started the TNA in the red port with GBR anf TPA'd purple port. Primary RN aware.

## 2020-05-24 NOTE — Progress Notes (Addendum)
PHARMACY - TOTAL PARENTERAL NUTRITION CONSULT NOTE  Indication: Small bowel obstruction  Patient Measurements: Height: 5\' 5"  (165.1 cm) Weight: 79.6 kg (175 lb 7.8 oz) IBW/kg (Calculated) : 57   Body mass index is 29.2 kg/m.  Assessment:  61 YOF who presented on 12/28 with abd pain, N/V and was found via KUB and CT showed SBO thought to be due to adhesions. The patient previously had a SBO back in 2016 which required surgery to correct. D#6 of NPO, NGT placed 12/28 for decompression, bowel rest, pre-albumin low at 15.6 - pharmacy consulted for TPN management.  Glucose / Insulin: no hx DM - CBGs mostly < 180, used 3 units SSI yesterday Electrolytes: all WNL except Phos at 1.7 (K low normal) Renal: SCr < 1, BUN WNL LFTs / TGs: LFTs / tbili / TG WNL Prealbumin / albumin: pre-albumin 15.6, albumin 2.8 Intake / Output; MIVF: UOP 0.4 ml/kg/hr, NG 1106mL (pulled out NGT 1/3 d/t agitation), net -2.3L GI Imaging: 12/28 Abd CT: SBO likely d/t adhesions, no perf or evidence of ischemia 1/1 Abd X-ray: Persistent SBO w/ no evidence of improvement 1/2 Abd, x-ray - Improving bowel gas pattern with decreasing bowel distention since prior study 1/3 abd XR - dilated hemiabdomen small bowel loop Surgeries / Procedures:  1/3 ex-lap with open LoA and small bowel repair  Central access: PICC placed 05/22/20 TPN start date: 05/22/20  Nutritional Goals (per RD rec on 1/3): kCal: 1800-1950, Protein: 85-95, Fluid: >/=1.8L/day  Current Nutrition:  TPN  Plan:  Utilizing 2-in-1 TPN with Tues/Fri Intralipids due to allergy and to minimize waste during shortage  TPN at 70 ml/hr and Intralipids at 20 ml/hr x 12 hrs on Tues and Fri, providing a weekly average of 1851 kCal and 86gm protein per day, meeting 100% of patient needs. Electrolytes in TPN: Na 75mEq/L, incr K 17mEq/L, Ca 52mEq/L, incr Mag 81mEq/L, incr Phos 44mmol/L, Cl:Ac 1:1 Add standard MVI and trace elements to TPN Continue sensitive SSI Q6H KPhos  25 mmol IV x 1 Mag sulfate 1gm IV x 1 Monitor for EFAD F/U AM labs  Keyonta Madrid D. Mina Marble, PharmD, BCPS, [place] 05/24/2020, 7:31 AM

## 2020-05-25 ENCOUNTER — Ambulatory Visit: Payer: Medicare HMO | Admitting: Internal Medicine

## 2020-05-25 DIAGNOSIS — R911 Solitary pulmonary nodule: Secondary | ICD-10-CM | POA: Diagnosis not present

## 2020-05-25 DIAGNOSIS — K56609 Unspecified intestinal obstruction, unspecified as to partial versus complete obstruction: Secondary | ICD-10-CM | POA: Diagnosis not present

## 2020-05-25 DIAGNOSIS — I10 Essential (primary) hypertension: Secondary | ICD-10-CM | POA: Diagnosis not present

## 2020-05-25 DIAGNOSIS — J4550 Severe persistent asthma, uncomplicated: Secondary | ICD-10-CM | POA: Diagnosis not present

## 2020-05-25 LAB — CBC
HCT: 38 % (ref 36.0–46.0)
Hemoglobin: 12.2 g/dL (ref 12.0–15.0)
MCH: 29.7 pg (ref 26.0–34.0)
MCHC: 32.1 g/dL (ref 30.0–36.0)
MCV: 92.5 fL (ref 80.0–100.0)
Platelets: 247 10*3/uL (ref 150–400)
RBC: 4.11 MIL/uL (ref 3.87–5.11)
RDW: 13.6 % (ref 11.5–15.5)
WBC: 12.1 10*3/uL — ABNORMAL HIGH (ref 4.0–10.5)
nRBC: 0 % (ref 0.0–0.2)

## 2020-05-25 LAB — GLUCOSE, CAPILLARY
Glucose-Capillary: 107 mg/dL — ABNORMAL HIGH (ref 70–99)
Glucose-Capillary: 151 mg/dL — ABNORMAL HIGH (ref 70–99)
Glucose-Capillary: 151 mg/dL — ABNORMAL HIGH (ref 70–99)
Glucose-Capillary: 173 mg/dL — ABNORMAL HIGH (ref 70–99)
Glucose-Capillary: 94 mg/dL (ref 70–99)

## 2020-05-25 LAB — MAGNESIUM: Magnesium: 2 mg/dL (ref 1.7–2.4)

## 2020-05-25 LAB — BASIC METABOLIC PANEL
Anion gap: 6 (ref 5–15)
BUN: 21 mg/dL (ref 8–23)
CO2: 23 mmol/L (ref 22–32)
Calcium: 8.2 mg/dL — ABNORMAL LOW (ref 8.9–10.3)
Chloride: 109 mmol/L (ref 98–111)
Creatinine, Ser: 0.66 mg/dL (ref 0.44–1.00)
GFR, Estimated: >60 mL/min (ref >60)
Glucose, Bld: 91 mg/dL (ref 70–99)
Potassium: 4 mmol/L (ref 3.5–5.1)
Sodium: 138 mmol/L (ref 135–145)

## 2020-05-25 LAB — PHOSPHORUS: Phosphorus: 3 mg/dL (ref 2.5–4.6)

## 2020-05-25 MED ORDER — PANTOPRAZOLE SODIUM 40 MG IV SOLR
40.0000 mg | INTRAVENOUS | Status: DC
  Administered 2020-05-25 – 2020-05-29 (×6): 40 mg via INTRAVENOUS
  Filled 2020-05-25 (×6): qty 40

## 2020-05-25 MED ORDER — ACETAMINOPHEN 10 MG/ML IV SOLN
1000.0000 mg | Freq: Four times a day (QID) | INTRAVENOUS | Status: AC
  Administered 2020-05-25 – 2020-05-26 (×4): 1000 mg via INTRAVENOUS
  Filled 2020-05-25 (×4): qty 100

## 2020-05-25 MED ORDER — MOMETASONE FURO-FORMOTEROL FUM 100-5 MCG/ACT IN AERO
2.0000 | INHALATION_SPRAY | Freq: Two times a day (BID) | RESPIRATORY_TRACT | Status: DC
  Administered 2020-05-25 – 2020-06-03 (×15): 2 via RESPIRATORY_TRACT
  Filled 2020-05-25: qty 8.8

## 2020-05-25 MED ORDER — TRAVASOL 10 % IV SOLN
INTRAVENOUS | Status: AC
  Filled 2020-05-25: qty 856.8

## 2020-05-25 MED ORDER — ALBUTEROL SULFATE HFA 108 (90 BASE) MCG/ACT IN AERS: 2.0000 | INHALATION_SPRAY | RESPIRATORY_TRACT | Status: DC | PRN

## 2020-05-25 NOTE — Evaluation (Addendum)
Occupational Therapy Evaluation Patient Details Name: Angelica [name] MRN: [id number] DOB: 1940-10-05 Today's Date: 05/25/2020    History of Present Illness Pt adm with SBO. Also found incidental Covid+. Underwent diagnostic laparoscopy, open lysis of adhesions and enterrorhaphy on 05/23/20. PMH - HTN, SBO, thyroid disease, asthma.   Clinical Impression   This 80 y/o female presents with the above. PTA pt reports being independent with ADL and functional mobility. Pt currently presenting with deficits including pain, generalized weakness, decreased activity tolerance and standing balance. Pt tolerating room level mobility (to BSC placed in front of sink and back to recliner, seated rest in between) using RW overall with minA (+2 safety/equipment) today. She requires up to maxA for LB and toileting ADL, setup/supervision for seated UB ADL. VSS on RA. Pt fatigued with activity but appears appreciative of being OOB. Am hopeful pt will progress to point of being able to return home with [facility] services and family assist. If slow to progress may need to consider SNF. Acute OT to follow.     Follow Up Recommendations  Home health OT;Supervision/Assistance - 24 hour (pending progress)    Equipment Recommendations  3 in 1 bedside commode;Wheelchair (measurements OT);Wheelchair cushion (measurements OT)           Precautions / Restrictions Precautions Precautions: Fall Restrictions Weight Bearing Restrictions: No      Mobility Bed Mobility Overal bed mobility: Needs Assistance Bed Mobility: Supine to Sit     Supine to sit: Mod assist;+2 for safety/equipment     General bed mobility comments: assist for trunk elevation    Transfers Overall transfer level: Needs assistance Equipment used: Rolling walker (2 wheeled) Transfers: Sit to/from Stand Sit to Stand: Min assist;Mod assist;+2 safety/equipment         General transfer comment: Assist to bring hips up and for balance. Verbal cues  for hand placement. pt requiring increased boosting assist from lower surface of BSC    Balance Overall balance assessment: Needs assistance Sitting-balance support: No upper extremity supported;Feet supported Sitting balance-Leahy Scale: Fair     Standing balance support: Bilateral upper extremity supported;During functional activity Standing balance-Leahy Scale: Poor Standing balance comment: UE support and min guard for static standing                           ADL either performed or assessed with clinical judgement   ADL Overall ADL's : Needs assistance/impaired Eating/Feeding: NPO Eating/Feeding Details (indicate cue type and reason): except ice chips Grooming: Wash/dry face;Supervision/safety;Sitting Grooming Details (indicate cue type and reason): supported sitting Upper Body Bathing: Min guard;Sitting   Lower Body Bathing: Moderate assistance;Sit to/from stand   Upper Body Dressing : Min guard;Sitting   Lower Body Dressing: Moderate assistance;Sit to/from stand   Toilet Transfer: Minimal assistance;+2 for safety/equipment;Ambulation;BSC;RW Toilet Transfer Details (indicate cue type and reason): placed BSC on other side of bed in room to promote further mobility Toileting- Clothing Manipulation and Hygiene: Maximal assistance;+2 for safety/equipment;Sit to/from stand Toileting - Clothing Manipulation Details (indicate cue type and reason): assist for anterior pericare     Functional mobility during ADLs: Minimal assistance;+2 for safety/equipment;Rolling walker General ADL Comments: pt with limitations given pain, overall weakness and fatigue                         Pertinent Vitals/Pain Pain Assessment: Faces Faces Pain Scale: Hurts whole lot Pain Location: abdomen, R flank Pain Descriptors / Indicators: Grimacing;Operative  site guarding;Crying;Discomfort Pain Intervention(s): Limited activity within patient's tolerance;Monitored during  session;Patient requesting pain meds-RN notified;RN gave pain meds during session     Hand Dominance     Extremity/Trunk Assessment Upper Extremity Assessment Upper Extremity Assessment: Generalized weakness   Lower Extremity Assessment Lower Extremity Assessment: Defer to PT evaluation       Communication Communication Communication: No difficulties   Cognition Arousal/Alertness: Awake/alert Behavior During Therapy: Flat affect Overall Cognitive Status: Impaired/Different from baseline Area of Impairment: Memory;Following commands;Safety/judgement;Problem solving                     Memory: Decreased short-term memory Following Commands: Follows one step commands consistently;Follows one step commands with increased time Safety/Judgement: Decreased awareness of deficits   Problem Solving: Slow processing;Decreased initiation;Requires verbal cues General Comments: slow to respond but overall appears appropriate today. per RN pt has had some confusion over last couple of days   General Comments  VSS on RA    Exercises     Shoulder Instructions      Home Living Family/patient expects to be discharged to:: Private residence Living Arrangements: Children Available Help at Discharge: Family;Available 24 hours/day Type of Home: House Home Access: Level entry     Home Layout: One level     Bathroom Shower/Tub: Teacher, early years/pre: Standard     Home Equipment: None          Prior Functioning/Environment Level of Independence: Independent                 OT Problem List: Decreased strength;Decreased range of motion;Decreased activity tolerance;Impaired balance (sitting and/or standing);Decreased cognition;Decreased safety awareness;Decreased knowledge of use of DME or AE;Obesity;Pain      OT Treatment/Interventions: Self-care/ADL training;Therapeutic exercise;Energy conservation;DME and/or AE instruction;Therapeutic  activities;Cognitive remediation/compensation;Patient/family education;Balance training    OT Goals(Current goals can be found in the care plan section) Acute Rehab OT Goals Patient Stated Goal: not stated OT Goal Formulation: With patient Time For Goal Achievement: 06/08/20 Potential to Achieve Goals: Good  OT Frequency: Min 2X/week   Barriers to D/C:            Co-evaluation PT/OT/SLP Co-Evaluation/Treatment: Yes Reason for Co-Treatment: For patient/therapist safety;To address functional/ADL transfers   OT goals addressed during session: ADL's and self-care      AM-PAC OT "6 Clicks" Daily Activity     Outcome Measure Help from another person eating meals?: Total (NPO) Help from another person taking care of personal grooming?: A Little Help from another person toileting, which includes using toliet, bedpan, or urinal?: A Lot Help from another person bathing (including washing, rinsing, drying)?: A Lot Help from another person to put on and taking off regular upper body clothing?: A Little Help from another person to put on and taking off regular lower body clothing?: A Lot 6 Click Score: 13   End of Session Equipment Utilized During Treatment: Gait belt;Rolling walker Nurse Communication: Mobility status  Activity Tolerance: Patient tolerated treatment well Patient left: in chair;with call bell/phone within reach;with chair alarm set;with restraints reapplied  OT Visit Diagnosis: Muscle weakness (generalized) (M62.81);Unsteadiness on feet (R26.81);Pain Pain - part of body:  (abdomen)                Time: 3790-2409 OT Time Calculation (min): 27 min Charges:  OT General Charges $OT Visit: 1 Visit OT Evaluation $OT Eval Moderate Complexity: [place], OT Acute Rehabilitation Services Pager [phone] Office [phone]   [place]  Megan Salon 05/25/2020, 12:37 PM

## 2020-05-25 NOTE — Progress Notes (Addendum)
Physical Therapy Treatment Patient Details Name: Angelica [name] MRN: [id number] DOB: Apr 18, 1941 Today's Date: 05/25/2020    History of Present Illness Pt adm with SBO. Also found incidental Covid+. Underwent diagnostic laparoscopy, open lysis of adhesions and enterrorhaphy on 05/23/20. PMH - HTN, SBO, thyroid disease, asthma.    PT Comments    Patient reporting in moderate pain in abdomen. RN notified. Patient assisted to ambulate BSC x 10' x 2 and back to recliner. Patient is very weak.  Patient may benefit from post acute rehab  At [facility]. Will see how she progresses. Currently noted blood on stool Today.. continue PT for progressive activity as tolerated. Patient on RA with SPO2 > 90%.   Follow Up Recommendations  Home health PT;Supervision/Assistance - 24 hourvs SNf     Equipment Recommendations  None recommended by PT    Recommendations for Other Services       Precautions / Restrictions Precautions Precautions: Fall Restrictions Weight Bearing Restrictions: No    Mobility  Bed Mobility Overal bed mobility: Needs Assistance Bed Mobility: Supine to Sit     Supine to sit: Mod assist     General bed mobility comments: assist for trunk elevation,  Transfers Overall transfer level: Needs assistance Equipment used: Rolling walker (2 wheeled) Transfers: Sit to/from [facility] Sit to Stand: Mod assist       General transfer comment: Assist to bring hips up and for balance. Verbal cues for hand placement. pt requiring increased boosting assist from lower surface of bed and BSC  Ambulation/GaitAmbulated x 10' x 2 using Rw and mod assistance and +2 for safety.                 Stairs             Wheelchair Mobility    Modified Rankin (Stroke Patients Only)       Balance Overall balance assessment: Needs assistance Sitting-balance support: No upper extremity supported;Feet supported Sitting balance-Leahy Scale: Fair     Standing  balance support: Bilateral upper extremity supported;During functional activity Standing balance-Leahy Scale: Poor Standing balance comment: UE support and min guard for static standing                            Cognition Arousal/Alertness: Awake/alert Behavior During Therapy: Flat affect Overall Cognitive Status: Impaired/Different from baseline Area of Impairment: Memory;Following commands;Safety/judgement;Problem solving                     Memory: Decreased short-term memory Following Commands: Follows one step commands consistently;Follows one step commands with increased time Safety/Judgement: Decreased awareness of deficits   Problem Solving: Slow processing;Decreased initiation;Requires verbal cues General Comments: slow to respond but overall appears appropriate today. per RN pt has had some confusion over last couple of days      Exercises      General Comments General comments (skin integrity, edema, etc.): VSS on RA      Pertinent Vitals/Pain Pain Assessment: Faces Faces Pain Scale: Hurts whole lot Pain Location: abdomen, R flank Pain Descriptors / Indicators: Grimacing;Operative site guarding;Crying;Discomfort Pain Intervention(s): Monitored during session;Patient requesting pain meds-RN notified    Home Living Family/patient expects to be discharged to:: Private residence Living Arrangements: Children Available Help at Discharge: Family;Available 24 hours/day Type of Home: House Home Access: Level entry   Home Layout: One level Home Equipment: None      Prior Function Level of Independence: Independent  PT Goals (current goals can now be found in the care plan section) Acute Rehab PT Goals Patient Stated Goal: not stated Progress towards PT goals: Progressing toward goals    Frequency    Min 3X/week      PT Plan Current plan remains appropriate    Co-evaluation PT/OT/SLP Co-Evaluation/Treatment: Yes Reason  for Co-Treatment: For patient/therapist safety PT goals addressed during session: Mobility/safety with mobility;Strengthening/ROM OT goals addressed during session: ADL's and self-care      AM-PAC PT "6 Clicks" Mobility   Outcome Measure  Help needed turning from your back to your side while in a flat bed without using bedrails?: A Lot Help needed moving from lying on your back to sitting on the side of a flat bed without using bedrails?: A Lot Help needed moving to and from a bed to a chair (including a wheelchair)?: A Lot Help needed standing up from a chair using your arms (e.g., wheelchair or bedside chair)?: A Lot Help needed to walk in hospital room?: A Lot Help needed climbing 3-5 steps with a railing? : Total 6 Click Score: 11    End of Session   Activity Tolerance: Patient limited by fatigue;Patient limited by pain Patient left: in bed;with call bell/phone within reach Nurse Communication: Mobility status PT Visit Diagnosis: Other abnormalities of gait and mobility (R26.89);Pain     Time: 9842-1031 PT Time Calculation (min) (ACUTE ONLY): 32 min  Charges:  $gait: 8-22 mins                     Tresa Endo PT Acute Rehabilitation Services Pager [phone] Office [phone]    Claretha Cooper 05/25/2020, 2:36 PM

## 2020-05-25 NOTE — Plan of Care (Signed)
  Problem: Education: Goal: Knowledge of General Education information will improve Description: Including pain rating scale, medication(s)/side effects and non-pharmacologic comfort measures Outcome: Progressing   Problem: Health Behavior/Discharge Planning: Goal: Ability to manage health-related needs will improve Outcome: Progressing   Problem: Clinical Measurements: Goal: Will remain free from infection Outcome: Progressing Goal: Diagnostic test results will improve Outcome: Progressing   Problem: Activity: Goal: Risk for activity intolerance will decrease Outcome: Progressing   Problem: Nutrition: Goal: Adequate nutrition will be maintained Outcome: Progressing   Problem: Elimination: Goal: Will not experience complications related to bowel motility Outcome: Progressing   Problem: Education: Goal: Knowledge of risk factors and measures for prevention of condition will improve Outcome: Progressing   Problem: Respiratory: Goal: Will maintain a patent airway Outcome: Progressing

## 2020-05-25 NOTE — Consult Note (Signed)
   [facility] Inpatient Consult   05/25/2020  DIANTHA PAXSON [birth year] [id number]  [employer] [ACO] Patient: [facility] HMO   Patient screened for high score for unplanned readmission risk and for length of stay day 7 hospitalization.  Reviewed to check for potential [facility] service needs.  Review of patient's medical record reveals patient is recommended for home with home health per PT/OT recommendations. Patient currently on TPN noted.  Primary Care Provider is [name], MD this provider is listed to provide the transition of care [TOC] for post hospital follow up.  Plan:  Continue to follow progress, barriers, and disposition to assess for post hospital transition of care management needs.    Please place a [facility] Care Management consult as appropriate and for questions contact:   Natividad Brood, RN BSN [facility] Liaison  [phone] business mobile phone Toll free office [phone]  Fax number: [phone] [email or web address]

## 2020-05-25 NOTE — Progress Notes (Signed)
PROGRESS NOTE                                                                                                                                                                                                             Patient Demographics:    Ratasha Fabre, is a 80 y.o. female, DOB - [birth year], JAS:[id number]  Outpatient Primary MD for the patient is [name], MD   Admit date - 05/17/2020   LOS - 8  Chief Complaint  Patient presents with  . Abdominal Pain       Brief Narrative: Patient is a 80 y.o. female with PMHx of HTN, severe persistent asthma (on benralizumab infusion every 8 weeks)-prior history of SBO-who presented to the hospital with abdominal pain, nausea/vomiting-she was found to have SBO and incidental COVID infection.  She was subsequently admitted to the hospitalist service.  COVID-19 vaccinated status: Fully vaccinated including booster.  Significant Events: 12/28>> Admit to [facility] for SBO  Significant studies: 12/28>> CT abdomen/pelvis: SBO with transition point in the mid central abdomen 12/28>> CT chest: Left lower lobe mass with left hilar/mediastinal adenopathy, enlarged/heterogeneous thyroid  COVID-19 medications: Monoclonal antibody: 12/29 x 1  Antibiotics: None  Microbiology data: None  Procedures: 1/3>> diagnostic laparoscopy-open lysis of adhesions   Consults: General surgery  DVT prophylaxis: enoxaparin (LOVENOX) injection 40 mg Start: 05/17/20 2030    Subjective:    Estalee Mccandlish today denies any significant abdominal pain-denies any BM or passing any flatus.   Assessment  & Plan :   SBO: Secondary to lesions-s/p laparoscopy and lysis of adhesions-General surgery following and directing care.  Remains n.p.o.-on TNA-awaiting return of bowel function.  COVID-19 infection: Asymptomatic-s/p MAB infusion-apart from close monitoring-no other therapies indicated at this  point.  Last day of isolation on 1/6  HTN: BP stable-resume antihypertensives when able  GERD: On PPI  Left lung mass: Seen incidentally on imaging studies-will need pulmonary evaluation-bronchoscopy/biopsy-once she completely recovers from her acute illness.  Enlarged/heterogeneous thyroid gland: Seen incidentally on CT chest-we will need outpatient thyroid ultrasound.  History of severe persistent asthma: Stable-resume Symbicort-on as needed albuterol infusion.  Follows with allergy/immunology and is on Fasenra infusion every 8 weeks.  Plan is to resume montelukast when oral intake resumes.   ABG:    Component Value Date/Time   TCO2 25 08/22/2016 1818  Vent Settings: N/A    Condition -Stable  Family Communication  :  [phone] updated over the phone on 1/5  Code Status :  Full Code  Diet :  Diet Order            Diet NPO time specified  Diet effective now                  Disposition Plan  :   Status is: Inpatient  Remains inpatient appropriate because:Inpatient level of care appropriate due to severity of illness   Dispo: The patient is from: Home              Anticipated d/c is to: Home              Anticipated d/c date is: > 3 days              Patient currently is not medically stable to d/c.  Barriers to discharge: SBO-s/p laparoscopic lysis of adhesions-awaiting return of bowel function.  Antimicorbials  :    Anti-infectives (From admission, onward)   Start     Dose/Rate Route Frequency Ordered Stop   05/23/20 1300  ceFAZolin (ANCEF) IVPB 2g/100 mL premix        2 g 200 mL/hr over 30 Minutes Intravenous On call to O.R. 05/23/20 1107 05/23/20 1448      Inpatient Medications  Scheduled Meds: . Chlorhexidine Gluconate Cloth  6 each Topical Daily  . enoxaparin (LOVENOX) injection  40 mg Subcutaneous Q24H  . insulin aspart  0-9 Units Subcutaneous Q6H   Continuous Infusions: . acetaminophen Stopped (05/25/20 1235)  .  methocarbamol (ROBAXIN) IV    . TPN ADULT (ION) 70 mL/hr at 05/25/20 0329  . TPN ADULT (ION)     PRN Meds:.acetaminophen **OR** acetaminophen, albuterol, EPINEPHrine, fentaNYL (SUBLIMAZE) injection, haloperidol lactate, labetalol, methocarbamol (ROBAXIN) IV, ondansetron **OR** ondansetron (ZOFRAN) IV, promethazine, sodium chloride flush   Time Spent in minutes  25  See all Orders from today for further details   Oren Binet M.D on 05/25/2020 at 4:04 PM  To page go to [email or web address] - use universal password  [facility] -  Office  [phone]    Objective:   Vitals:   05/25/20 0001 05/25/20 0357 05/25/20 0845 05/25/20 1232  BP: 117/62 106/65  139/89  Pulse: 74 81  82  Resp: 18 18  18   Temp: (!) 97.4 F (36.3 C) 98.1 F (36.7 C)  98.5 F (36.9 C)  TempSrc: Oral Oral  Oral  SpO2: 90% 100% 100% 100%  Weight:      Height:        Wt Readings from Last 3 Encounters:  05/19/20 79.6 kg  05/17/20 81.6 kg  05/03/20 82.6 kg     Intake/Output Summary (Last 24 hours) at 05/25/2020 1604 Last data filed at 05/25/2020 1500 Gross per 24 hour  Intake 2310.52 ml  Output 200 ml  Net 2110.52 ml     Physical Exam Gen Exam: Not in any distress HEENT:atraumatic, normocephalic Chest: B/L clear to auscultation anteriorly CVS:S1S2 regular Abdomen: Soft-sluggish bowel sounds. Extremities:no edema Neurology: Non focal Skin: no rash   Data Review:    CBC Recent Labs  Lab 05/19/20 0915 05/20/20 0040 05/22/20 1800 05/23/20 0404 05/25/20 0348  WBC 7.3 8.4 6.8 5.4 12.1*  HGB 16.3* 15.8* 13.3 13.3 12.2  HCT 46.9* 46.1* 41.6 41.5 38.0  PLT 293 219 237 245 247  MCV 88.2 88.7 92.4 93.0 92.5  MCH 30.6 30.4 29.6 29.8  29.7  MCHC 34.8 34.3 32.0 32.0 32.1  RDW 13.8 13.7 13.4 13.4 13.6  LYMPHSABS  --   --  1.4 1.4  --   MONOABS  --   --  0.9 0.8  --   EOSABS  --   --  0.0 0.0  --   BASOSABS  --   --  0.0 0.0  --     Chemistries  Recent Labs  Lab 05/20/20 0040  05/22/20 1750 05/23/20 0404 05/24/20 0417 05/25/20 0348  NA 139 144 144 142 138  K 3.9 3.3* 3.3* 3.6 4.0  CL 107 111 110 111 109  CO2 20* 22 24 23 23   GLUCOSE 112* 101* 163* 191* 91  BUN 14 19 21 22 21   CREATININE 0.92 0.79 0.75 0.76 0.66  CALCIUM 9.0 8.9 8.9 8.5* 8.2*  MG  --  2.1 2.1 1.9 2.0  AST  --  18 18  --   --   ALT  --  13 14  --   --   ALKPHOS  --  64 59  --   --   BILITOT  --  0.9 0.8  --   --    ------------------------------------------------------------------------------------------------------------------ Recent Labs    05/22/20 1750 05/23/20 0404  TRIG 91 97    Lab Results  Component Value Date   HGBA1C 5.8 05/03/2020   ------------------------------------------------------------------------------------------------------------------ No results for input(s): TSH, T4TOTAL, T3FREE, THYROIDAB in the last 72 hours.  Invalid input(s): FREET3 ------------------------------------------------------------------------------------------------------------------ No results for input(s): VITAMINB12, FOLATE, FERRITIN, TIBC, IRON, RETICCTPCT in the last 72 hours.  Coagulation profile No results for input(s): INR, PROTIME in the last 168 hours.  No results for input(s): DDIMER in the last 72 hours.  Cardiac Enzymes No results for input(s): CKMB, TROPONINI, MYOGLOBIN in the last 168 hours.  Invalid input(s): CK ------------------------------------------------------------------------------------------------------------------ No results found for: BNP  Micro Results Recent Results (from the past 240 hour(s))  Resp Panel by RT-PCR (Flu A&B, Covid) Nasopharyngeal Swab     Status: Abnormal   Collection Time: 05/17/20  8:48 PM   Specimen: Nasopharyngeal Swab; Nasopharyngeal(NP) swabs in vial transport medium  Result Value Ref Range Status   SARS Coronavirus 2 by RT PCR POSITIVE (A) NEGATIVE Final    Comment: RESULT CALLED TO, READ BACK BY AND VERIFIED WITH: J NEWTON  RN 05/17/20 2213 JDW (NOTE) SARS-CoV-2 target nucleic acids are DETECTED.  The SARS-CoV-2 RNA is generally detectable in upper respiratory specimens during the acute phase of infection. Positive results are indicative of the presence of the identified virus, but do not rule out bacterial infection or co-infection with other pathogens not detected by the test. Clinical correlation with patient history and other diagnostic information is necessary to determine patient infection status. The expected result is Negative.  Fact Sheet for Patients: EntrepreneurPulse.com.au  Fact Sheet for Healthcare Providers: IncredibleEmployment.be  This test is not yet approved or cleared by the [place] FDA and  has been authorized for detection and/or diagnosis of SARS-CoV-2 by FDA under an Emergency Use Authorization (EUA).  This EUA will remain in effect (meaning this test can be use d) for the duration of  the COVID-19 declaration under Section 564(b)(1) of the Act, 21 U.S.C. section 360bbb-3(b)(1), unless the authorization is terminated or revoked sooner.     Influenza A by PCR NEGATIVE NEGATIVE Final   Influenza B by PCR NEGATIVE NEGATIVE Final    Comment: (NOTE) The Xpert Xpress SARS-CoV-2/FLU/RSV plus assay is intended as an aid in the  diagnosis of influenza from Nasopharyngeal swab specimens and should not be used as a sole basis for treatment. Nasal washings and aspirates are unacceptable for Xpert Xpress SARS-CoV-2/FLU/RSV testing.  Fact Sheet for Patients: EntrepreneurPulse.com.au  Fact Sheet for Healthcare Providers: IncredibleEmployment.be  This test is not yet approved or cleared by the [place] FDA and has been authorized for detection and/or diagnosis of SARS-CoV-2 by FDA under an Emergency Use Authorization (EUA). This EUA will remain in effect (meaning this test can be used) for the duration of  the COVID-19 declaration under Section 564(b)(1) of the Act, 21 U.S.C. section 360bbb-3(b)(1), unless the authorization is terminated or revoked.  Performed at [facility], [place] [address], [place], [place] [address]     Radiology Reports DG Abd 1 View  Result Date: 05/21/2020 CLINICAL DATA:  80 year old female with small bowel obstruction. EXAM: ABDOMEN - 1 VIEW COMPARISON:  CT Abdomen and Pelvis 05/17/2020. Postcontrast abdominal radiographs 05/19/2020. FINDINGS: Portable AP supine views since 05/19/2020 the oral contrast has become dilute throughout the abdomen and pelvis. Still, faint contrast is visible within a dilated left abdominal loop seen previously (arrow). And there is no convincing large bowel contrast. Stable enteric tube visible in the epigastrium. Stable abdominal and pelvic visceral contours. Stable visualized osseous structures. At 1313 hours. IMPRESSION: 1. Persistent small bowel obstruction, with no evidence of improvement since 05/19/2020. 2. Stable enteric tube. Electronically Signed   By: Genevie Ann M.D.   On: 05/21/2020 13:31   CT CHEST W CONTRAST  Result Date: 05/17/2020 CLINICAL DATA:  May 17, 2020, abdominal CT. FINDINGS: Cardiovascular: Atheromatous plaque in the thoracic aorta. No aneurysmal dilation. Noncalcified plaque as well. Normal heart size. Three-vessel coronary artery disease. Central pulmonary vasculature unremarkable on venous phase assessment. Mediastinum/Nodes: Heterogeneous and enlarged thyroid gland, multinodular changes. No axillary lymphadenopathy. Enlarged peribronchial lymph node on image 69 of series 3 a 13 mm. Borderline enlarged LEFT infrahilar lymph node at 9 mm on image 71 of series 3. Subcarinal lymph node at 14 mm on image 61 of series 3. No additional mediastinal or hilar nodal enlargement. Esophagus mildly patulous. Lungs/Pleura: Pulmonary mass measuring 3.5 x 2.8 cm on image 94 of series 4 with spiculated margins contacting the  visceral pleura in the LEFT lower lobe, situated in the medial aspect of the LEFT lower lobe. Direct extension of the mass versus is infra hilar lymph node on image 88 of series 4 measuring 13 mm. Bronchial narrowing of secondary bronchial structures at the level of the mass beneath the LEFT hilum. Airways are otherwise patent. Tiny nodule in the RIGHT chest on image 75 series 4 approximately 2-3 mm. Upper Abdomen: Incidental imaging of upper abdominal contents again with small amount of perisplenic fluid and marked small bowel dilation. Visible adrenal glands are normal. There is excreted contrast within the kidneys partially visualized. Musculoskeletal: No acute musculoskeletal finding. No destructive bone lesion. Spinal degenerative changes. IMPRESSION: 1. LEFT lower lobe mass with LEFT hilar and mediastinal adenopathy most suggestive of bronchogenic carcinoma. Referral to multi disciplinary thoracic oncologic setting with PET or biopsy for further assessment is suggested. 2. Tiny 2-3 mm nodule in the RIGHT chest on image 75 series 4. Attention on follow-up. 3. Signs of bowel obstruction and fluid in the upper abdomen about the spleen partially imaged. 4. Enlarged and heterogeneous thyroid. Recommend thyroid ultrasound (ref: J Am Coll Radiol. 2015 Feb;12(2): 143-50). 5. Three-vessel coronary artery disease. 6. Aortic atherosclerosis Aortic Atherosclerosis (ICD10-I70.0). Electronically Signed   By: Cay Schillings  Wile M.D.   On: 05/17/2020 21:21   CT ABDOMEN PELVIS W CONTRAST  Result Date: 05/17/2020 CLINICAL DATA:  Abdominal pain. Vomiting. Possible bowel obstruction on radiograph. EXAM: CT ABDOMEN AND PELVIS WITH CONTRAST TECHNIQUE: Multidetector CT imaging of the abdomen and pelvis was performed using the standard protocol following bolus administration of intravenous contrast. CONTRAST:  166mL OMNIPAQUE IOHEXOL 300 MG/ML  SOLN COMPARISON:  Radiographs earlier today.  CT 02/04/2017 FINDINGS: Lower chest:  Lobulated 2.5 cm left lower lobe pulmonary nodule with irregular margins, increased in size from prior exam. No pleural fluid or acute airspace disease. Hepatobiliary: Mild hepatic steatosis. No focal hepatic lesion. Gallbladder physiologically distended, no calcified stone. No biliary dilatation. Pancreas: Parenchymal atrophy. No ductal dilatation or inflammation. Spleen: Normal in size without focal abnormality. Adrenals/Urinary Tract: Normal adrenal glands. No hydronephrosis or perinephric edema. Homogeneous renal enhancement with symmetric excretion on delayed phase imaging. Tiny hypodensity in the upper left kidney is too small to accurately characterize. Urinary bladder is physiologically distended without wall thickening. Slight cystocele. Stomach/Bowel: Fluid-filled distended gastric cardia. Dilated fluid-filled proximal small bowel. Transition from dilated to nondilated in the mid central abdomen, series 4, image 51. More distal small bowel is decompressed. Mild mesenteric edema and small free fluid. No pneumatosis. The appendix is not confidently visualized on the current exam. Majority of the colon is decompressed. Occasional descending and sigmoid diverticula without diverticulitis. There is pelvic floor descent with small-bowel loops coursing posterior to the bladder. Vascular/Lymphatic: No acute vascular findings are evidence of vascular compromise. Aortic atherosclerosis. Patent portal vein. Patent mesenteric vessels. No enlarged lymph nodes in the abdomen or pelvis. Reproductive: Status post hysterectomy. No adnexal masses. Other: Small volume of non organized free fluid in both upper quadrants, scattered throughout the mesentery and in the pelvis. No free air or perforation. No focal fluid collection/abscess. Small fat containing upper ventral abdominal wall hernia. Musculoskeletal: Diffuse lumbar degenerative change. No acute osseous abnormality or focal osseous lesion. Scattered subcutaneous  granuloma in both gluteal regions/flanks. IMPRESSION: 1. Small bowel obstruction with transition point in the mid central abdomen, likely due to adhesions. Small volume of non organized free fluid in the abdomen and pelvis. No perforation or evidence of ischemia. 2. Lobulated 2.5 cm left lower lobe pulmonary nodule with irregular margins, increased in size from prior exam. This is suspicious for primary bronchogenic malignancy. Recommend dedicated chest CT (with IV contrast) for complete evaluation of the thorax. 3. Mild hepatic steatosis. 4. Pelvic floor descent with small-bowel loops coursing posterior to the bladder. Aortic Atherosclerosis (ICD10-I70.0). Electronically Signed   By: Keith Rake M.D.   On: 05/17/2020 19:40   DG ABD ACUTE 2+V W 1V CHEST  Result Date: 05/17/2020 CLINICAL DATA:  Chronic upper abdominal pain EXAM: DG ABDOMEN ACUTE WITH 1 VIEW CHEST COMPARISON:  08/15/2017 FINDINGS: Supine and upright frontal views of the abdomen and pelvis as well as an upright frontal view of the chest are obtained. Cardiac silhouette is stable. No airspace disease, effusion, or pneumothorax. There is a dilated loop of distended gas-filled small bowel within the left upper quadrant, measuring up to 5.2 cm in diameter. Gas fluid level seen within the left upper quadrant. There is a paucity of distal small bowel gas and colonic gas. No free gas in the greater peritoneal sac. No masses or abnormal calcifications. IMPRESSION: 1. Findings compatible with high-grade small bowel obstruction. 2. No acute intrathoracic process. Electronically Signed   By: Randa Ngo M.D.   On: 05/17/2020 16:24  DG Abd Portable 1V  Result Date: 05/23/2020 CLINICAL DATA:  SBO EXAM: PORTABLE ABDOMEN - 1 VIEW COMPARISON:  05/22/2020 and prior. FINDINGS: Dilated right hemiabdomen small bowel loop measuring up to 5.3 cm. No evidence of pneumoperitoneum. Multilevel spondylosis. Bilateral body wall calcified granulomas. IMPRESSION:  Dilated right hemiabdomen small bowel loop measuring up to 5.3 cm. Electronically Signed   By: Primitivo Gauze M.D.   On: 05/23/2020 08:43   DG Abd Portable 1V  Result Date: 05/22/2020 CLINICAL DATA:  Vomiting, abdominal pain EXAM: PORTABLE ABDOMEN - 1 VIEW COMPARISON:  05/21/2020 FINDINGS: NG tube remains in the stomach. Decreasing bowel distension. No organomegaly or free air. Small left pleural effusion noted with left base atelectasis. IMPRESSION: Improving bowel gas pattern with decreasing bowel distention since prior study. NG tube remains in the stomach. Electronically Signed   By: Rolm Baptise M.D.   On: 05/22/2020 11:33   DG Abd Portable 1V-Small Bowel Obstruction Protocol-initial, 8 hr delay  Result Date: 05/19/2020 CLINICAL DATA:  Small-bowel obstruction EXAM: PORTABLE ABDOMEN - 1 VIEW COMPARISON:  05/18/2020 FINDINGS: Two supine frontal views of the abdomen and pelvis are obtained. Enteric catheter tip and side port project over the gastric fundus. There is excreted contrast within the bladder related to previous CT. Oral contrast is seen within the stomach and duodenal sweep. Dilute contrast is seen filling dilated loops of small bowel within the left mid abdomen. No evidence of distal small bowel or colonic contrast. IMPRESSION: 1. No evidence of colonic contrast. Persistent dilated small bowel consistent with small-bowel obstruction. Electronically Signed   By: Randa Ngo M.D.   On: 05/19/2020 20:18   DG Abd Portable 1V-Small Bowel Obstruction Protocol-initial, 8 hr delay  Result Date: 05/18/2020 CLINICAL DATA:  Small bowel obstruction EXAM: PORTABLE ABDOMEN - 1 VIEW COMPARISON:  05/17/2020 FINDINGS: Enteric tube has been placed with tip in the left upper quadrant. The proximal side hole projects over the EG junction region, possibly at or just below the EG junction. Advancement is suggested. Persistent gaseous distention of left upper quadrant bowel, similar to prior study.  Residual contrast material in the bladder. IMPRESSION: Enteric tube tip is in the left upper quadrant with proximal side hole at or just below the EG junction. Advancement is suggested. Electronically Signed   By: Lucienne Capers M.D.   On: 05/18/2020 17:22   DG Abd Portable 1 View  Result Date: 05/17/2020 CLINICAL DATA:  Nasogastric tube placement EXAM: PORTABLE ABDOMEN - 1 VIEW COMPARISON:  05/17/2020 FINDINGS: Nasogastric tube extends into the upper abdomen with its tip overlying the expected proximal to mid body of the stomach. Visualized lung bases are clear. Dilated loops of gas-filled small bowel are seen within the left upper quadrant suggestive of underlying small-bowel obstruction, however, the mid and inferior abdomen are excluded from view. No gross free intraperitoneal gas within the visualized abdomen. IMPRESSION: Nasogastric tube extends into the proximal to mid body of the stomach. Electronically Signed   By: Fidela Salisbury MD   On: 05/17/2020 23:03   [place] EKG SITE RITE  Result Date: 05/22/2020 If Site Rite image not attached, placement could not be confirmed due to current cardiac rhythm.

## 2020-05-25 NOTE — Progress Notes (Signed)
[facility] Progress Note  2 Days Post-Op  Subjective: CC-  Tired this morning. States that she did not sleep well. Denies abdominal pain, nausea, vomiting. No flatus or BM. Mobilized with PT yesterday.  Objective: Vital signs in last 24 hours: Temp:  [97.4 F (36.3 C)-98.4 F (36.9 C)] 98.1 F (36.7 C) (01/05 0357) Pulse Rate:  [74-98] 81 (01/05 0357) Resp:  [16-25] 18 (01/05 0357) BP: (106-151)/(62-104) 106/65 (01/05 0357) SpO2:  [90 %-100 %] 100 % (01/05 0845) Last BM Date: 05/17/20  Intake/Output from previous day: 01/04 0701 - 01/05 0700 In: 1466.7 [I.V.:658.1; IV Piggyback:808.6] Out: -  Intake/Output this shift: No intake/output data recorded.  PE: Gen: Alert, NAD, pleasant Pulm: rate and effort normal Abd: Soft,mild distension, appropriately tender over incision, +BS, midline incision with honeycomb dressing in place and small amount dried blood on dressing Skin: no rashes noted, warm and dry   Lab Results:  Recent Labs    05/23/20 0404 05/25/20 0348  WBC 5.4 12.1*  HGB 13.3 12.2  HCT 41.5 38.0  PLT 245 247   BMET Recent Labs    05/24/20 0417 05/25/20 0348  NA 142 138  K 3.6 4.0  CL 111 109  CO2 23 23  GLUCOSE 191* 91  BUN 22 21  CREATININE 0.76 0.66  CALCIUM 8.5* 8.2*   PT/INR No results for input(s): LABPROT, INR in the last 72 hours. CMP     Component Value Date/Time   NA 138 05/25/2020 0348   K 4.0 05/25/2020 0348   CL 109 05/25/2020 0348   CO2 23 05/25/2020 0348   GLUCOSE 91 05/25/2020 0348   BUN 21 05/25/2020 0348   CREATININE 0.66 05/25/2020 0348   CALCIUM 8.2 (L) 05/25/2020 0348   PROT 6.2 (L) 05/23/2020 0404   PROT 7.7 06/07/2017 0940   ALBUMIN 2.8 (L) 05/23/2020 0404   AST 18 05/23/2020 0404   ALT 14 05/23/2020 0404   ALKPHOS 59 05/23/2020 0404   BILITOT 0.8 05/23/2020 0404   GFRNONAA >60 05/25/2020 0348   GFRAA >60 08/15/2017 0012   Lipase     Component Value Date/Time   LIPASE 24 05/17/2020 1756        Studies/Results: No results found.  Anti-infectives: Anti-infectives (From admission, onward)   Start     Dose/Rate Route Frequency Ordered Stop   05/23/20 1300  ceFAZolin (ANCEF) IVPB 2g/100 mL premix        2 g 200 mL/hr over 30 Minutes Intravenous On call to O.R. 05/23/20 1107 05/23/20 1448       Assessment/Plan HTN HLD GERD Asthma LLL lung mass - noted on CT, concerning for primary bronchogenic malignancy Enlarged thyroid COVDPositive - spoke with infection prevention, if patient remains asymptomatic vs mild symptoms she can come off precautions 05/27/20 Malnourished/deconditioned- prealbumin 15, started TPN  SBO S/p diagnostic laparoscopic, open lysis of adhesions, enterrorhaphy 05/24/20 Dr. Kieth Brightly - POD#2 - Continue NPO and await return in bowel function. If patient becomes distended or complains of n/v will need to replace NG tube.  - Continue TPN until able to reliably tolerate diet.  - mobilize, PT/OT - continue IV tylenol for improved pain control - I called and updated the patient's daughter  ID -none FEN - NPO, TPN VTE -SCDs, lovenox Foley -none Follow up -staple removal, Dr. Kieth Brightly   LOS: 8 days    [name], [facility] 05/25/2020, 9:11 AM Please see Amion for pager number during day hours 7:00am-4:30pm

## 2020-05-25 NOTE — Progress Notes (Signed)
PHARMACY - TOTAL PARENTERAL NUTRITION CONSULT NOTE  Indication: Small bowel obstruction  Patient Measurements: Height: 5\' 5"  (165.1 cm) Weight: 79.6 kg (175 lb 7.8 oz) IBW/kg (Calculated) : 57   Body mass index is 29.2 kg/m.  Assessment:  7 YOF who presented on 12/28 with abd pain, N/V and was found via KUB and CT showed SBO thought to be due to adhesions. The patient previously had a SBO back in 2016 which required surgery to correct. D#6 of NPO, NGT placed 12/28 for decompression, bowel rest, pre-albumin low at 15.6 - pharmacy consulted for TPN management.  Glucose / Insulin: no hx DM - CBGs < 180, used 7 units SSI yesterday Electrolytes: all WNL (K 4 and Phos 3 post KPhos 25 mmol, Mag 2gm post 1gm) Renal: SCr < 1, BUN WNL LFTs / TGs: LFTs / tbili / TG WNL Prealbumin / albumin: pre-albumin 15.6, albumin 2.8 Intake / Output; MIVF: UOP not charted, pulled out NGT 1/3 d/t agitation GI Imaging: 12/28 Abd CT: SBO likely d/t adhesions, no perf or evidence of ischemia 1/1 Abd X-ray: Persistent SBO w/ no evidence of improvement 1/2 Abd, x-ray - Improving bowel gas pattern with decreasing bowel distention since prior study 1/3 abd XR - dilated hemiabdomen small bowel loop Surgeries / Procedures:  1/3 ex-lap with open LoA and small bowel repair  Central access: PICC placed 05/22/20 TPN start date: 05/22/20  Nutritional Goals (per RD rec on 1/3): kCal: 1800-1950, Protein: 85-95, Fluid: >/=1.8L/day  Current Nutrition:  TPN  Plan:  Continue 2-in-1 TPN with Tues/Fri Intralipids due to allergy and to minimize waste during shortage  TPN at 70 ml/hr and Intralipids at 20 ml/hr x 12 hrs on Tues and Fri, providing a weekly average of 1851 kCal and 86gm protein per day, meeting 100% of patient needs. Electrolytes in TPN: Na 42mEq/L, K incr 77mEq/L on 1/4, Ca 24mEq/L, incr Mag 50mEq/L on 1/4, incr Phos 110mmol/L on 1/4, Cl:Ac 1:1 Add standard MVI and trace elements to TPN Continue sensitive SSI  Q6H for now Standard TPN labs on Mon/Thurs  Angelica [name] D. Mina Marble, PharmD, BCPS, [place] 05/25/2020, 7:13 AM

## 2020-05-26 DIAGNOSIS — J4550 Severe persistent asthma, uncomplicated: Secondary | ICD-10-CM | POA: Diagnosis not present

## 2020-05-26 DIAGNOSIS — R911 Solitary pulmonary nodule: Secondary | ICD-10-CM | POA: Diagnosis not present

## 2020-05-26 DIAGNOSIS — I10 Essential (primary) hypertension: Secondary | ICD-10-CM | POA: Diagnosis not present

## 2020-05-26 DIAGNOSIS — K56609 Unspecified intestinal obstruction, unspecified as to partial versus complete obstruction: Secondary | ICD-10-CM | POA: Diagnosis not present

## 2020-05-26 LAB — MAGNESIUM: Magnesium: 1.7 mg/dL (ref 1.7–2.4)

## 2020-05-26 LAB — COMPREHENSIVE METABOLIC PANEL
ALT: 17 U/L (ref 0–44)
AST: 18 U/L (ref 15–41)
Albumin: 2.4 g/dL — ABNORMAL LOW (ref 3.5–5.0)
Alkaline Phosphatase: 60 U/L (ref 38–126)
Anion gap: 8 (ref 5–15)
BUN: 15 mg/dL (ref 8–23)
CO2: 24 mmol/L (ref 22–32)
Calcium: 8.3 mg/dL — ABNORMAL LOW (ref 8.9–10.3)
Chloride: 103 mmol/L (ref 98–111)
Creatinine, Ser: 0.65 mg/dL (ref 0.44–1.00)
GFR, Estimated: >60 mL/min (ref >60)
Glucose, Bld: 172 mg/dL — ABNORMAL HIGH (ref 70–99)
Potassium: 4.3 mmol/L (ref 3.5–5.1)
Sodium: 135 mmol/L (ref 135–145)
Total Bilirubin: 0.6 mg/dL (ref 0.3–1.2)
Total Protein: 5.6 g/dL — ABNORMAL LOW (ref 6.5–8.1)

## 2020-05-26 LAB — CBC
HCT: 32.9 % — ABNORMAL LOW (ref 36.0–46.0)
Hemoglobin: 11 g/dL — ABNORMAL LOW (ref 12.0–15.0)
MCH: 30.5 pg (ref 26.0–34.0)
MCHC: 33.4 g/dL (ref 30.0–36.0)
MCV: 91.1 fL (ref 80.0–100.0)
Platelets: 230 10*3/uL (ref 150–400)
RBC: 3.61 MIL/uL — ABNORMAL LOW (ref 3.87–5.11)
RDW: 13.4 % (ref 11.5–15.5)
WBC: 10.9 10*3/uL — ABNORMAL HIGH (ref 4.0–10.5)
nRBC: 0 % (ref 0.0–0.2)

## 2020-05-26 LAB — GLUCOSE, CAPILLARY
Glucose-Capillary: 173 mg/dL — ABNORMAL HIGH (ref 70–99)
Glucose-Capillary: 189 mg/dL — ABNORMAL HIGH (ref 70–99)
Glucose-Capillary: 127 mg/dL — ABNORMAL HIGH (ref 70–99)

## 2020-05-26 LAB — PHOSPHORUS: Phosphorus: 3.1 mg/dL (ref 2.5–4.6)

## 2020-05-26 MED ORDER — ENOXAPARIN SODIUM 40 MG/0.4ML ~~LOC~~ SOLN
40.0000 mg | SUBCUTANEOUS | Status: DC
  Administered 2020-05-26: 40 mg via SUBCUTANEOUS
  Filled 2020-05-26: qty 0.4

## 2020-05-26 MED ORDER — CHLORHEXIDINE GLUCONATE CLOTH 2 % EX PADS
6.0000 | MEDICATED_PAD | Freq: Every day | CUTANEOUS | Status: DC
  Administered 2020-05-26 – 2020-06-03 (×8): 6 via TOPICAL

## 2020-05-26 MED ORDER — TRAVASOL 10 % IV SOLN
INTRAVENOUS | Status: DC
  Filled 2020-05-26: qty 856.8

## 2020-05-26 MED ORDER — TRACE MINERALS CU-MN-SE-ZN 300-55-60-3000 MCG/ML IV SOLN
INTRAVENOUS | Status: AC
  Filled 2020-05-26: qty 571.2

## 2020-05-26 MED ORDER — GUAIFENESIN-DM 100-10 MG/5ML PO SYRP
5.0000 mL | ORAL_SOLUTION | Freq: Once | ORAL | Status: DC
  Filled 2020-05-26: qty 5

## 2020-05-26 MED ORDER — ALTEPLASE 2 MG IJ SOLR
2.0000 mg | Freq: Once | INTRAMUSCULAR | Status: AC
  Administered 2020-05-26: 2 mg
  Filled 2020-05-26: qty 2

## 2020-05-26 MED ORDER — SIMETHICONE 80 MG PO CHEW
80.0000 mg | CHEWABLE_TABLET | Freq: Four times a day (QID) | ORAL | Status: DC | PRN
  Administered 2020-05-26 – 2020-05-31 (×8): 80 mg via ORAL
  Filled 2020-05-26 (×9): qty 1

## 2020-05-26 MED ORDER — BOOST / RESOURCE BREEZE PO LIQD CUSTOM
1.0000 | Freq: Two times a day (BID) | ORAL | Status: DC
  Administered 2020-05-27 (×2): 1 via ORAL

## 2020-05-26 MED ORDER — MAGNESIUM SULFATE 2 GM/50ML IV SOLN
2.0000 g | Freq: Once | INTRAVENOUS | Status: AC
  Administered 2020-05-26: 2 g via INTRAVENOUS
  Filled 2020-05-26: qty 50

## 2020-05-26 NOTE — Progress Notes (Addendum)
PHARMACY - TOTAL PARENTERAL NUTRITION CONSULT NOTE  Indication: Small bowel obstruction  Patient Measurements: Height: 5\' 5"  (165.1 cm) Weight: 79.6 kg (175 lb 7.8 oz) IBW/kg (Calculated) : 57   Body mass index is 29.2 kg/m.  Assessment:  72 YOF who presented on 12/28 with abd pain, N/V and was found via KUB and CT showed SBO thought to be due to adhesions. The patient previously had a SBO back in 2016 which required surgery to correct. D#6 of NPO, NGT placed 12/28 for decompression, bowel rest, pre-albumin low at 15.6 - pharmacy consulted for TPN management.  Glucose / Insulin: no hx DM - CBGs < 180, used 4 units SSI yesterday Electrolytes: all WNL (Na and Mag low normal) Renal: SCr < 1, BUN WNL LFTs / TGs: LFTs / tbili / TG WNL Prealbumin / albumin: pre-albumin 15.6, albumin 2.4 Intake / Output; MIVF: UOP 0.3 ml/kg/hr, pulled out NGT 1/3 d/t agitation, total I/O's inaccurate GI Imaging: 12/28 Abd CT: SBO likely d/t adhesions, no perf or evidence of ischemia 1/1 Abd X-ray: Persistent SBO w/ no evidence of improvement 1/2 Abd, x-ray - Improving bowel gas pattern with decreasing bowel distention since prior study 1/3 abd XR - dilated hemiabdomen small bowel loop Surgeries / Procedures:  1/3 ex-lap with open LoA and small bowel repair  Central access: PICC placed 05/22/20 TPN start date: 05/22/20  Nutritional Goals (per RD rec on 1/3): kCal: 1800-1950, Protein: 85-95, Fluid: >/=1.8L/day  Current Nutrition:  TPN  Plan:  Continue 2-in-1 TPN with Tues/Fri Intralipids due to allergy and to minimize waste during shortage Change to Clinisol due to Travasol shortage - monitor Na with increased free water provision  TPN at 70 ml/hr and Intralipids at 20 ml/hr x 12 hrs on Tues and Fri, providing a weekly average of 1851 kCal and 86gm protein per day, meeting 100% of patient needs. Electrolytes in TPN: incr Na 70mEq/L, decr K slightly to 46mEq/L, Ca 83mEq/L, incr Mag 67mEq/L, Phos 98mmol/L,  Cl:Ac 1:1 Add standard MVI and trace elements to TPN Continue sensitive SSI Q6H  Mag sulfate 2gm IV x1 F/U AM labs  Derek Huneycutt D. Mina Marble, PharmD, BCPS, [place] 05/26/2020, 7:32 AM

## 2020-05-26 NOTE — Progress Notes (Signed)
Nutrition Follow-up  DOCUMENTATION CODES:   Not applicable  INTERVENTION:  TPN per Pharmacy.  Provide Boost Breeze po BID, each supplement provides 250 kcal and 9 grams of protein.  NUTRITION DIAGNOSIS:   Inadequate oral intake related to inability to eat as evidenced by NPO status; diet advanced; progressing  GOAL:   Patient will meet greater than or equal to 90% of their needs; met with TPN  MONITOR:   Skin,Weight trends,Labs,I & O's,Diet advancement  REASON FOR ASSESSMENT:   Malnutrition Screening Tool    ASSESSMENT:   80 y.o. female with medical history significant for hypertension and thyroid disease, SBO in 2016 presents with abdominal pain, nausea, and vomiting. 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  Procedure (1/3): diagnostic laparoscopic, open lysis of adhesions, enterrorhaphy   Pt had DM yesterday and abdominal less distended today. Clear liquid po trial initiated today. Per MD, plans to continue TPN until pt able to reliably tolerate diet. RD to order nutritional supplements to aid in po intake. Per Pharmacy, TPN at 70 ml/hr and Intralipids at 20 ml/hr x 12 hrs on Tues and Fri, providing a weekly average of 1851 kCal and 86gm protein per day, meeting 100% of patient needs.  Labs and medications reviewed.  Diet Order:   Diet Order            Diet clear liquid Room service appropriate? Yes; Fluid consistency: Thin  Diet effective now                 EDUCATION NEEDS:   Not appropriate for education at this time  Skin:  Skin Assessment: Reviewed RN Assessment  Last BM:  1/5  Height:   Ht Readings from Last 1 Encounters:  05/19/20 $RemoveB'5\' 5"'tyOUpOov$  (1.651 m)    Weight:   Wt Readings from Last 1 Encounters:  05/19/20 79.6 kg    BMI:  Body mass index is 29.2  kg/m.  Estimated Nutritional Needs:   Kcal:  1800-1950  Protein:  85-95 grams  Fluid:  >/= 1.8 L/day  Corrin Parker, MS, RD, LDN RD pager number/after hours weekend pager number on Am[employer].

## 2020-05-26 NOTE — Progress Notes (Signed)
1520: Pt c/o bloating in stomach area after lunch. Pt placed on clear liquids this morning, tolerated breakfast well. Pt sitting in chair, transferred to bed and given Zofran d/t nausea. Surgery PA Brooke made aware. Said if still feeling bloated/nauseous at dinner then hold off on giving clears.   1640: Reassess, Pt still feeling bloated and c/o Abd pain. Brooke, [place] made aware. Orders for simethicone given and administered. Pt seems to be feeling better, plan to transfer to 6N10.

## 2020-05-26 NOTE — Progress Notes (Addendum)
PROGRESS NOTE                                                                                                                                                                                                             Patient Demographics:    Niasia Lanphear, is a 80 y.o. female, DOB - 07/30/1940, MEQ:[id number]  Outpatient Primary MD for the patient is [name], MD   Admit date - 05/17/2020   LOS - 9  Chief Complaint  Patient presents with  . Abdominal Pain       Brief Narrative: Patient is a 80 y.o. female with PMHx of HTN, severe persistent asthma (on benralizumab infusion every 8 weeks)-prior history of SBO-who presented to the hospital with abdominal pain, nausea/vomiting-she was found to have SBO and incidental COVID infection.  She was subsequently admitted to the hospitalist service.  COVID-19 vaccinated status: Fully vaccinated including booster.  Significant Events: 12/28>> Admit to [facility] for SBO  Significant studies: 12/28>> CT abdomen/pelvis: SBO with transition point in the mid central abdomen 12/28>> CT chest: Left lower lobe mass with left hilar/mediastinal adenopathy, enlarged/heterogeneous thyroid  COVID-19 medications: Monoclonal antibody: 12/29 x 1  Antibiotics: None  Microbiology data: None  Procedures: 1/3>> diagnostic laparoscopy-open lysis of adhesions   Consults: General surgery  DVT prophylaxis: enoxaparin (LOVENOX) injection 40 mg Start: 05/26/20 2000    Subjective:    Lincoln Maxin today denies any significant abdominal pain-denies any BM or passing any flatus.   Assessment  & Plan :   SBO: Secondary to lesions-s/p laparoscopy and lysis of adhesions-General surgery following and directing care.  Apparently had small BM last night-General surgery following-started on clear liquids-remains on TNA.  Follow  COVID-19 infection: Asymptomatic-s/p MAB infusion-apart from  close monitoring-no other therapies indicated at this point.  Last day of isolation on 1/6-following which she no longer requires any further isolation.  HTN: BP stable-resume antihypertensives when able  GERD: On PPI  Left lung mass: Seen incidentally on imaging studies-will need pulmonary evaluation-bronchoscopy/biopsy-once she completely recovers from her acute illness.  Addendum: Discussed with pulmonology-Dr. Icard-he will arrange follow-up in the next 1-2 weeks with his office, and then a subsequent biopsy.  Epic message with patient demographics sent to him as well.  Enlarged/heterogeneous thyroid gland: Seen incidentally on CT chest-we will need outpatient thyroid ultrasound.  History  of severe persistent asthma: Stable-resume Symbicort-on as needed albuterol infusion.  Follows with allergy/immunology and is on on benralizumab infusion every 8 weeks.  Plan is to resume montelukast when oral intake resumes.   ABG:    Component Value Date/Time   TCO2 25 08/22/2016 1818    Vent Settings: N/A    Condition -Stable  Family Communication  :  [phone] updated over the phone on 1/6  Code Status :  Full Code  Diet :  Diet Order            Diet clear liquid Room service appropriate? Yes; Fluid consistency: Thin  Diet effective now                  Disposition Plan  :   Status is: Inpatient  Remains inpatient appropriate because:Inpatient level of care appropriate due to severity of illness   Dispo: The patient is from: Home              Anticipated d/c is to: Home              Anticipated d/c date is: > 3 days              Patient currently is not medically stable to d/c.  Barriers to discharge: SBO-s/p laparoscopic lysis of adhesions-awaiting return of bowel function.  Antimicorbials  :    Anti-infectives (From admission, onward)   Start     Dose/Rate Route Frequency Ordered Stop   05/23/20 1300  ceFAZolin (ANCEF) IVPB 2g/100 mL premix         2 g 200 mL/hr over 30 Minutes Intravenous On call to O.R. 05/23/20 1107 05/23/20 1448      Inpatient Medications  Scheduled Meds: . enoxaparin (LOVENOX) injection  40 mg Subcutaneous Q24H  . feeding supplement  1 Container Oral BID BM  . guaiFENesin-dextromethorphan  5 mL Oral Once  . insulin aspart  0-9 Units Subcutaneous Q6H  . mometasone-formoterol  2 puff Inhalation BID  . pantoprazole (PROTONIX) IV  40 mg Intravenous Q24H   Continuous Infusions: . methocarbamol (ROBAXIN) IV    . TPN ADULT (ION) 70 mL/hr at 05/25/20 1725  . TPN ADULT (ION)     PRN Meds:.albuterol, EPINEPHrine, fentaNYL (SUBLIMAZE) injection, haloperidol lactate, labetalol, methocarbamol (ROBAXIN) IV, ondansetron **OR** ondansetron (ZOFRAN) IV, promethazine, sodium chloride flush   Time Spent in minutes  25  See all Orders from today for further details   Oren Binet M.D on 05/26/2020 at 2:20 PM  To page go to [email or web address] - use universal password  [facility] -  Office  [phone]    Objective:   Vitals:   05/26/20 0000 05/26/20 0334 05/26/20 0747 05/26/20 1147  BP: (!) 151/70 (!) 145/66 (!) 161/92 (!) 148/77  Pulse: 88 79 84 83  Resp: 18 18 18 18   Temp: 98.1 F (36.7 C) 97.8 F (36.6 C) 97.8 F (36.6 C) 98.6 F (37 C)  TempSrc: Oral Oral Oral Oral  SpO2: 100% 100% 100% 100%  Weight:      Height:        Wt Readings from Last 3 Encounters:  05/19/20 79.6 kg  05/17/20 81.6 kg  05/03/20 82.6 kg     Intake/Output Summary (Last 24 hours) at 05/26/2020 1420 Last data filed at 05/26/2020 0335 Gross per 24 hour  Intake 1011.2 ml  Output 450 ml  Net 561.2 ml     Physical Exam Gen Exam: Not in any distress HEENT:atraumatic, normocephalic Chest: B/L clear  to auscultation anteriorly CVS:S1S2 regular Abdomen: Soft-sluggish bowel sounds. Extremities:no edema Neurology: Non focal Skin: no rash   Data Review:    CBC Recent Labs  Lab 05/20/20 0040 05/22/20 1800  05/23/20 0404 05/25/20 0348 05/26/20 0500  WBC 8.4 6.8 5.4 12.1* 10.9*  HGB 15.8* 13.3 13.3 12.2 11.0*  HCT 46.1* 41.6 41.5 38.0 32.9*  PLT 219 237 245 247 230  MCV 88.7 92.4 93.0 92.5 91.1  MCH 30.4 29.6 29.8 29.7 30.5  MCHC 34.3 32.0 32.0 32.1 33.4  RDW 13.7 13.4 13.4 13.6 13.4  LYMPHSABS  --  1.4 1.4  --   --   MONOABS  --  0.9 0.8  --   --   EOSABS  --  0.0 0.0  --   --   BASOSABS  --  0.0 0.0  --   --     Chemistries  Recent Labs  Lab 05/22/20 1750 05/23/20 0404 05/24/20 0417 05/25/20 0348 05/26/20 0500  NA 144 144 142 138 135  K 3.3* 3.3* 3.6 4.0 4.3  CL 111 110 111 109 103  CO2 22 24 23 23 24   GLUCOSE 101* 163* 191* 91 172*  BUN 19 21 22 21 15   CREATININE 0.79 0.75 0.76 0.66 0.65  CALCIUM 8.9 8.9 8.5* 8.2* 8.3*  MG 2.1 2.1 1.9 2.0 1.7  AST 18 18  --   --  18  ALT 13 14  --   --  17  ALKPHOS 64 59  --   --  60  BILITOT 0.9 0.8  --   --  0.6   ------------------------------------------------------------------------------------------------------------------ No results for input(s): CHOL, HDL, LDLCALC, TRIG, CHOLHDL, LDLDIRECT in the last 72 hours.  Lab Results  Component Value Date   HGBA1C 5.8 05/03/2020   ------------------------------------------------------------------------------------------------------------------ No results for input(s): TSH, T4TOTAL, T3FREE, THYROIDAB in the last 72 hours.  Invalid input(s): FREET3 ------------------------------------------------------------------------------------------------------------------ No results for input(s): VITAMINB12, FOLATE, FERRITIN, TIBC, IRON, RETICCTPCT in the last 72 hours.  Coagulation profile No results for input(s): INR, PROTIME in the last 168 hours.  No results for input(s): DDIMER in the last 72 hours.  Cardiac Enzymes No results for input(s): CKMB, TROPONINI, MYOGLOBIN in the last 168 hours.  Invalid input(s):  CK ------------------------------------------------------------------------------------------------------------------ No results found for: BNP  Micro Results Recent Results (from the past 240 hour(s))  Resp Panel by RT-PCR (Flu A&B, Covid) Nasopharyngeal Swab     Status: Abnormal   Collection Time: 05/17/20  8:48 PM   Specimen: Nasopharyngeal Swab; Nasopharyngeal(NP) swabs in vial transport medium  Result Value Ref Range Status   SARS Coronavirus 2 by RT PCR POSITIVE (A) NEGATIVE Final    Comment: RESULT CALLED TO, READ BACK BY AND VERIFIED WITH: J NEWTON RN 05/17/20 2213 JDW (NOTE) SARS-CoV-2 target nucleic acids are DETECTED.  The SARS-CoV-2 RNA is generally detectable in upper respiratory specimens during the acute phase of infection. Positive results are indicative of the presence of the identified virus, but do not rule out bacterial infection or co-infection with other pathogens not detected by the test. Clinical correlation with patient history and other diagnostic information is necessary to determine patient infection status. The expected result is Negative.  Fact Sheet for Patients: EntrepreneurPulse.com.au  Fact Sheet for Healthcare Providers: IncredibleEmployment.be  This test is not yet approved or cleared by the [place] FDA and  has been authorized for detection and/or diagnosis of SARS-CoV-2 by FDA under an Emergency Use Authorization (EUA).  This EUA will remain in effect (meaning  this test can be use d) for the duration of  the COVID-19 declaration under Section 564(b)(1) of the Act, 21 U.S.C. section 360bbb-3(b)(1), unless the authorization is terminated or revoked sooner.     Influenza A by PCR NEGATIVE NEGATIVE Final   Influenza B by PCR NEGATIVE NEGATIVE Final    Comment: (NOTE) The Xpert Xpress SARS-CoV-2/FLU/RSV plus assay is intended as an aid in the diagnosis of influenza from Nasopharyngeal swab specimens  and should not be used as a sole basis for treatment. Nasal washings and aspirates are unacceptable for Xpert Xpress SARS-CoV-2/FLU/RSV testing.  Fact Sheet for Patients: EntrepreneurPulse.com.au  Fact Sheet for Healthcare Providers: IncredibleEmployment.be  This test is not yet approved or cleared by the [place] FDA and has been authorized for detection and/or diagnosis of SARS-CoV-2 by FDA under an Emergency Use Authorization (EUA). This EUA will remain in effect (meaning this test can be used) for the duration of the COVID-19 declaration under Section 564(b)(1) of the Act, 21 U.S.C. section 360bbb-3(b)(1), unless the authorization is terminated or revoked.  Performed at [facility], [address] [place], [place] [address]     Radiology Reports DG Abd 1 View  Result Date: 05/21/2020 CLINICAL DATA:  80 year old female with small bowel obstruction. EXAM: ABDOMEN - 1 VIEW COMPARISON:  CT Abdomen and Pelvis 05/17/2020. Postcontrast abdominal radiographs 05/19/2020. FINDINGS: Portable AP supine views since 05/19/2020 the oral contrast has become dilute throughout the abdomen and pelvis. Still, faint contrast is visible within a dilated left abdominal loop seen previously (arrow). And there is no convincing large bowel contrast. Stable enteric tube visible in the epigastrium. Stable abdominal and pelvic visceral contours. Stable visualized osseous structures. At 1313 hours. IMPRESSION: 1. Persistent small bowel obstruction, with no evidence of improvement since 05/19/2020. 2. Stable enteric tube. Electronically Signed   By: Genevie Ann M.D.   On: 05/21/2020 13:31   CT CHEST W CONTRAST  Result Date: 05/17/2020 CLINICAL DATA:  May 17, 2020, abdominal CT. FINDINGS: Cardiovascular: Atheromatous plaque in the thoracic aorta. No aneurysmal dilation. Noncalcified plaque as well. Normal heart size. Three-vessel coronary artery disease. Central  pulmonary vasculature unremarkable on venous phase assessment. Mediastinum/Nodes: Heterogeneous and enlarged thyroid gland, multinodular changes. No axillary lymphadenopathy. Enlarged peribronchial lymph node on image 69 of series 3 a 13 mm. Borderline enlarged LEFT infrahilar lymph node at 9 mm on image 71 of series 3. Subcarinal lymph node at 14 mm on image 61 of series 3. No additional mediastinal or hilar nodal enlargement. Esophagus mildly patulous. Lungs/Pleura: Pulmonary mass measuring 3.5 x 2.8 cm on image 94 of series 4 with spiculated margins contacting the visceral pleura in the LEFT lower lobe, situated in the medial aspect of the LEFT lower lobe. Direct extension of the mass versus is infra hilar lymph node on image 88 of series 4 measuring 13 mm. Bronchial narrowing of secondary bronchial structures at the level of the mass beneath the LEFT hilum. Airways are otherwise patent. Tiny nodule in the RIGHT chest on image 75 series 4 approximately 2-3 mm. Upper Abdomen: Incidental imaging of upper abdominal contents again with small amount of perisplenic fluid and marked small bowel dilation. Visible adrenal glands are normal. There is excreted contrast within the kidneys partially visualized. Musculoskeletal: No acute musculoskeletal finding. No destructive bone lesion. Spinal degenerative changes. IMPRESSION: 1. LEFT lower lobe mass with LEFT hilar and mediastinal adenopathy most suggestive of bronchogenic carcinoma. Referral to multi disciplinary thoracic oncologic setting with PET or biopsy for  further assessment is suggested. 2. Tiny 2-3 mm nodule in the RIGHT chest on image 75 series 4. Attention on follow-up. 3. Signs of bowel obstruction and fluid in the upper abdomen about the spleen partially imaged. 4. Enlarged and heterogeneous thyroid. Recommend thyroid ultrasound (ref: J Am Coll Radiol. 2015 Feb;12(2): 143-50). 5. Three-vessel coronary artery disease. 6. Aortic atherosclerosis Aortic  Atherosclerosis (ICD10-I70.0). Electronically Signed   By: Zetta Bills M.D.   On: 05/17/2020 21:21   CT ABDOMEN PELVIS W CONTRAST  Result Date: 05/17/2020 CLINICAL DATA:  Abdominal pain. Vomiting. Possible bowel obstruction on radiograph. EXAM: CT ABDOMEN AND PELVIS WITH CONTRAST TECHNIQUE: Multidetector CT imaging of the abdomen and pelvis was performed using the standard protocol following bolus administration of intravenous contrast. CONTRAST:  118mL OMNIPAQUE IOHEXOL 300 MG/ML  SOLN COMPARISON:  Radiographs earlier today.  CT 02/04/2017 FINDINGS: Lower chest: Lobulated 2.5 cm left lower lobe pulmonary nodule with irregular margins, increased in size from prior exam. No pleural fluid or acute airspace disease. Hepatobiliary: Mild hepatic steatosis. No focal hepatic lesion. Gallbladder physiologically distended, no calcified stone. No biliary dilatation. Pancreas: Parenchymal atrophy. No ductal dilatation or inflammation. Spleen: Normal in size without focal abnormality. Adrenals/Urinary Tract: Normal adrenal glands. No hydronephrosis or perinephric edema. Homogeneous renal enhancement with symmetric excretion on delayed phase imaging. Tiny hypodensity in the upper left kidney is too small to accurately characterize. Urinary bladder is physiologically distended without wall thickening. Slight cystocele. Stomach/Bowel: Fluid-filled distended gastric cardia. Dilated fluid-filled proximal small bowel. Transition from dilated to nondilated in the mid central abdomen, series 4, image 51. More distal small bowel is decompressed. Mild mesenteric edema and small free fluid. No pneumatosis. The appendix is not confidently visualized on the current exam. Majority of the colon is decompressed. Occasional descending and sigmoid diverticula without diverticulitis. There is pelvic floor descent with small-bowel loops coursing posterior to the bladder. Vascular/Lymphatic: No acute vascular findings are evidence of  vascular compromise. Aortic atherosclerosis. Patent portal vein. Patent mesenteric vessels. No enlarged lymph nodes in the abdomen or pelvis. Reproductive: Status post hysterectomy. No adnexal masses. Other: Small volume of non organized free fluid in both upper quadrants, scattered throughout the mesentery and in the pelvis. No free air or perforation. No focal fluid collection/abscess. Small fat containing upper ventral abdominal wall hernia. Musculoskeletal: Diffuse lumbar degenerative change. No acute osseous abnormality or focal osseous lesion. Scattered subcutaneous granuloma in both gluteal regions/flanks. IMPRESSION: 1. Small bowel obstruction with transition point in the mid central abdomen, likely due to adhesions. Small volume of non organized free fluid in the abdomen and pelvis. No perforation or evidence of ischemia. 2. Lobulated 2.5 cm left lower lobe pulmonary nodule with irregular margins, increased in size from prior exam. This is suspicious for primary bronchogenic malignancy. Recommend dedicated chest CT (with IV contrast) for complete evaluation of the thorax. 3. Mild hepatic steatosis. 4. Pelvic floor descent with small-bowel loops coursing posterior to the bladder. Aortic Atherosclerosis (ICD10-I70.0). Electronically Signed   By: Keith Rake M.D.   On: 05/17/2020 19:40   DG ABD ACUTE 2+V W 1V CHEST  Result Date: 05/17/2020 CLINICAL DATA:  Chronic upper abdominal pain EXAM: DG ABDOMEN ACUTE WITH 1 VIEW CHEST COMPARISON:  08/15/2017 FINDINGS: Supine and upright frontal views of the abdomen and pelvis as well as an upright frontal view of the chest are obtained. Cardiac silhouette is stable. No airspace disease, effusion, or pneumothorax. There is a dilated loop of distended gas-filled small bowel within the left upper quadrant,  measuring up to 5.2 cm in diameter. Gas fluid level seen within the left upper quadrant. There is a paucity of distal small bowel gas and colonic gas. No free  gas in the greater peritoneal sac. No masses or abnormal calcifications. IMPRESSION: 1. Findings compatible with high-grade small bowel obstruction. 2. No acute intrathoracic process. Electronically Signed   By: Randa Ngo M.D.   On: 05/17/2020 16:24   DG Abd Portable 1V  Result Date: 05/23/2020 CLINICAL DATA:  SBO EXAM: PORTABLE ABDOMEN - 1 VIEW COMPARISON:  05/22/2020 and prior. FINDINGS: Dilated right hemiabdomen small bowel loop measuring up to 5.3 cm. No evidence of pneumoperitoneum. Multilevel spondylosis. Bilateral body wall calcified granulomas. IMPRESSION: Dilated right hemiabdomen small bowel loop measuring up to 5.3 cm. Electronically Signed   By: Primitivo Gauze M.D.   On: 05/23/2020 08:43   DG Abd Portable 1V  Result Date: 05/22/2020 CLINICAL DATA:  Vomiting, abdominal pain EXAM: PORTABLE ABDOMEN - 1 VIEW COMPARISON:  05/21/2020 FINDINGS: NG tube remains in the stomach. Decreasing bowel distension. No organomegaly or free air. Small left pleural effusion noted with left base atelectasis. IMPRESSION: Improving bowel gas pattern with decreasing bowel distention since prior study. NG tube remains in the stomach. Electronically Signed   By: Rolm Baptise M.D.   On: 05/22/2020 11:33   DG Abd Portable 1V-Small Bowel Obstruction Protocol-initial, 8 hr delay  Result Date: 05/19/2020 CLINICAL DATA:  Small-bowel obstruction EXAM: PORTABLE ABDOMEN - 1 VIEW COMPARISON:  05/18/2020 FINDINGS: Two supine frontal views of the abdomen and pelvis are obtained. Enteric catheter tip and side port project over the gastric fundus. There is excreted contrast within the bladder related to previous CT. Oral contrast is seen within the stomach and duodenal sweep. Dilute contrast is seen filling dilated loops of small bowel within the left mid abdomen. No evidence of distal small bowel or colonic contrast. IMPRESSION: 1. No evidence of colonic contrast. Persistent dilated small bowel consistent with small-bowel  obstruction. Electronically Signed   By: Randa Ngo M.D.   On: 05/19/2020 20:18   DG Abd Portable 1V-Small Bowel Obstruction Protocol-initial, 8 hr delay  Result Date: 05/18/2020 CLINICAL DATA:  Small bowel obstruction EXAM: PORTABLE ABDOMEN - 1 VIEW COMPARISON:  05/17/2020 FINDINGS: Enteric tube has been placed with tip in the left upper quadrant. The proximal side hole projects over the EG junction region, possibly at or just below the EG junction. Advancement is suggested. Persistent gaseous distention of left upper quadrant bowel, similar to prior study. Residual contrast material in the bladder. IMPRESSION: Enteric tube tip is in the left upper quadrant with proximal side hole at or just below the EG junction. Advancement is suggested. Electronically Signed   By: Lucienne Capers M.D.   On: 05/18/2020 17:22   DG Abd Portable 1 View  Result Date: 05/17/2020 CLINICAL DATA:  Nasogastric tube placement EXAM: PORTABLE ABDOMEN - 1 VIEW COMPARISON:  05/17/2020 FINDINGS: Nasogastric tube extends into the upper abdomen with its tip overlying the expected proximal to mid body of the stomach. Visualized lung bases are clear. Dilated loops of gas-filled small bowel are seen within the left upper quadrant suggestive of underlying small-bowel obstruction, however, the mid and inferior abdomen are excluded from view. No gross free intraperitoneal gas within the visualized abdomen. IMPRESSION: Nasogastric tube extends into the proximal to mid body of the stomach. Electronically Signed   By: Fidela Salisbury MD   On: 05/17/2020 23:03   [place] EKG SITE RITE  Result Date: 05/22/2020  If [employer] not attached, placement could not be confirmed due to current cardiac rhythm.

## 2020-05-26 NOTE — Progress Notes (Signed)
Physical Therapy Treatment Patient Details Name: Angelica [name] MRN: [id number] DOB: 1941/05/11 Today's Date: 05/26/2020    History of Present Illness Pt adm with SBO. Also found incidental Covid+. Underwent diagnostic laparoscopy, open lysis of adhesions and enterrorhaphy on 05/23/20. PMH - HTN, SBO, thyroid disease, asthma.    PT Comments    Pt subjectively reporting she feels stronger today upon PT arrival to room. Pt demonstrating mobility progression today vs yesterday, ambulating x2 room distance with use of RW and min assist overall for mobility. Pt with BM in toilet, requiring light pericare assist from PT. PT emphasized the importance of OOB mobility and upright posture during mobility this day, pt receptive and applies feedback well. Continuing to recommend HHPT, will continue to follow.     Follow Up Recommendations  Home health PT;Supervision/Assistance - 24 hour     Equipment Recommendations  None recommended by PT    Recommendations for Other Services       Precautions / Restrictions Precautions Precautions: Fall Restrictions Weight Bearing Restrictions: No    Mobility  Bed Mobility Overal bed mobility: Needs Assistance Bed Mobility: Supine to Sit     Supine to sit: Min guard     General bed mobility comments: for safety, pt with use of HOB elevation and bedrails to perform.  Transfers Overall transfer level: Needs assistance Equipment used: Rolling walker (2 wheeled) Transfers: Sit to/from Stand Sit to Stand: Min assist         General transfer comment: Min assist for power up, and steadying. Verbal cuing for hand placement when rising/sitting. STS x2, from EOB and toilet.  Ambulation/Gait Ambulation/Gait assistance: Min assist Gait Distance (Feet): 20 Feet (+15) Assistive device: Rolling walker (2 wheeled) Gait Pattern/deviations: Step-through pattern;Shuffle;Trunk flexed;Decreased stride length Gait velocity: decr   General Gait Details: Min  assist to steady, verbal cuing for upright posture multiple times during gait, RW use, and hallway navigation.   Stairs             Wheelchair Mobility    Modified Rankin (Stroke Patients Only)       Balance Overall balance assessment: Needs assistance Sitting-balance support: No upper extremity supported;Feet supported Sitting balance-Leahy Scale: Fair     Standing balance support: Bilateral upper extremity supported;During functional activity Standing balance-Leahy Scale: Poor Standing balance comment: reliant on external assist                            Cognition Arousal/Alertness: Awake/alert Behavior During Therapy: WFL for tasks assessed/performed Overall Cognitive Status: Within Functional Limits for tasks assessed                                 General Comments: slightly increased response time, but responds appropriately. Pleasant and motivated to progress mobility      Exercises General Exercises - Lower Extremity Ankle Circles/Pumps: AROM;Both;10 reps;Seated Hip ABduction/ADduction: AAROM;Both;10 reps;Seated    General Comments General comments (skin integrity, edema, etc.): on RA, VSS. Pt requiring pericare assist after BM      Pertinent Vitals/Pain Pain Assessment: Faces Faces Pain Scale: Hurts a little bit Pain Location: abdomen, when encouraged to stand up straight Pain Descriptors / Indicators: Grimacing;Discomfort;Tightness Pain Intervention(s): Limited activity within patient's tolerance;Monitored during session;Repositioned    Home Living                      Prior  Function            PT Goals (current goals can now be found in the care plan section) Acute Rehab PT Goals Patient Stated Goal: go home PT Goal Formulation: With patient Time For Goal Achievement: 06/07/20 Potential to Achieve Goals: Good Progress towards PT goals: Progressing toward goals    Frequency    Min 3X/week      PT  Plan Current plan remains appropriate    Co-evaluation              AM-PAC PT "6 Clicks" Mobility   Outcome Measure  Help needed turning from your back to your side while in a flat bed without using bedrails?: A Lot Help needed moving from lying on your back to sitting on the side of a flat bed without using bedrails?: A Lot Help needed moving to and from a bed to a chair (including a wheelchair)?: A Lot Help needed standing up from a chair using your arms (e.g., wheelchair or bedside chair)?: A Lot Help needed to walk in hospital room?: A Lot Help needed climbing 3-5 steps with a railing? : Total 6 Click Score: 11    End of Session Equipment Utilized During Treatment: Gait belt Activity Tolerance: Patient limited by fatigue Patient left: with call bell/phone within reach;in chair;with chair alarm set Nurse Communication: Mobility status PT Visit Diagnosis: Other abnormalities of gait and mobility (R26.89);Pain     Time: 9357-0177 PT Time Calculation (min) (ACUTE ONLY): 29 min  Charges:  $Gait Training: 8-22 mins $Therapeutic Activity: 8-22 mins                     Stacie Glaze, PT Acute Rehabilitation Services Pager [phone]  Office [phone]     Roxine Caddy E Ruffin Pyo 05/26/2020, 12:10 PM

## 2020-05-26 NOTE — Progress Notes (Signed)
[facility] Progress Note  3 Days Post-Op  Subjective: CC-  Slept better last night. Still having some intermittent abdominal pain but it is well controlled. Denies n/v. Passing flatus and had a small BM yesterday.  Objective: Vital signs in last 24 hours: Temp:  [97.8 F (36.6 C)-98.5 F (36.9 C)] 97.8 F (36.6 C) (01/06 0747) Pulse Rate:  [79-88] 84 (01/06 0747) Resp:  [17-20] 18 (01/06 0747) BP: (139-161)/(66-92) 161/92 (01/06 0747) SpO2:  [100 %] 100 % (01/06 0747) Last BM Date: 05/17/20  Intake/Output from previous day: 01/05 0701 - 01/06 0700 In: 1111.2 [I.V.:911.2; IV Piggyback:200] Out: 650 [Urine:650] Intake/Output this shift: No intake/output data recorded.  PE: Gen: Alert, NAD, pleasant Pulm: rate and effort normal Abd: Soft,mild distension,appropriately tender over incision, +BS, midline incision with honeycomb dressing in place and small amount dried blood on dressing Skin: no rashes noted, warm and dry    Lab Results:  Recent Labs    05/25/20 0348 05/26/20 0500  WBC 12.1* 10.9*  HGB 12.2 11.0*  HCT 38.0 32.9*  PLT 247 230   BMET Recent Labs    05/25/20 0348 05/26/20 0500  NA 138 135  K 4.0 4.3  CL 109 103  CO2 23 24  GLUCOSE 91 172*  BUN 21 15  CREATININE 0.66 0.65  CALCIUM 8.2* 8.3*   PT/INR No results for input(s): LABPROT, INR in the last 72 hours. CMP     Component Value Date/Time   NA 135 05/26/2020 0500   K 4.3 05/26/2020 0500   CL 103 05/26/2020 0500   CO2 24 05/26/2020 0500   GLUCOSE 172 (H) 05/26/2020 0500   BUN 15 05/26/2020 0500   CREATININE 0.65 05/26/2020 0500   CALCIUM 8.3 (L) 05/26/2020 0500   PROT 5.6 (L) 05/26/2020 0500   PROT 7.7 06/07/2017 0940   ALBUMIN 2.4 (L) 05/26/2020 0500   AST 18 05/26/2020 0500   ALT 17 05/26/2020 0500   ALKPHOS 60 05/26/2020 0500   BILITOT 0.6 05/26/2020 0500   GFRNONAA >60 05/26/2020 0500   GFRAA >60 08/15/2017 0012   Lipase     Component Value Date/Time    LIPASE 24 05/17/2020 1756       Studies/Results: No results found.  Anti-infectives: Anti-infectives (From admission, onward)   Start     Dose/Rate Route Frequency Ordered Stop   05/23/20 1300  ceFAZolin (ANCEF) IVPB 2g/100 mL premix        2 g 200 mL/hr over 30 Minutes Intravenous On call to O.R. 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 Kieth Brightly - POD#3 - Bowel function returning, will trial clear liquids today. Continue TPN until able to reliably tolerate diet.  - mobilize, PT/OT  ID -none FEN - CLD, TPN VTE -SCDs, lovenox Foley -none Follow up -staple removal, Dr. Kieth Brightly    LOS: 9 days    [name], [facility] 05/26/2020, 8:39 AM Please see Amion for pager number during day hours 7:00am-4:30pm

## 2020-05-26 NOTE — Progress Notes (Signed)
Report given to receiving RN on [place]. Pt transferred to room 10 via SWAT nurse. Pt transferred with all belongings in stable condition.

## 2020-05-27 DIAGNOSIS — K56609 Unspecified intestinal obstruction, unspecified as to partial versus complete obstruction: Secondary | ICD-10-CM | POA: Diagnosis not present

## 2020-05-27 LAB — GLUCOSE, CAPILLARY
Glucose-Capillary: 159 mg/dL — ABNORMAL HIGH (ref 70–99)
Glucose-Capillary: 162 mg/dL — ABNORMAL HIGH (ref 70–99)
Glucose-Capillary: 163 mg/dL — ABNORMAL HIGH (ref 70–99)
Glucose-Capillary: 173 mg/dL — ABNORMAL HIGH (ref 70–99)
Glucose-Capillary: 179 mg/dL — ABNORMAL HIGH (ref 70–99)

## 2020-05-27 LAB — BASIC METABOLIC PANEL
Anion gap: 8 (ref 5–15)
BUN: 14 mg/dL (ref 8–23)
CO2: 26 mmol/L (ref 22–32)
Calcium: 8.6 mg/dL — ABNORMAL LOW (ref 8.9–10.3)
Chloride: 101 mmol/L (ref 98–111)
Creatinine, Ser: 0.73 mg/dL (ref 0.44–1.00)
GFR, Estimated: >60 mL/min (ref >60)
Glucose, Bld: 146 mg/dL — ABNORMAL HIGH (ref 70–99)
Potassium: 4.5 mmol/L (ref 3.5–5.1)
Sodium: 135 mmol/L (ref 135–145)

## 2020-05-27 LAB — MAGNESIUM: Magnesium: 2 mg/dL (ref 1.7–2.4)

## 2020-05-27 MED ORDER — LOSARTAN POTASSIUM 50 MG PO TABS
100.0000 mg | ORAL_TABLET | Freq: Every day | ORAL | Status: DC
  Administered 2020-05-27 – 2020-06-03 (×8): 100 mg via ORAL
  Filled 2020-05-27 (×8): qty 2

## 2020-05-27 MED ORDER — MONTELUKAST SODIUM 10 MG PO TABS
10.0000 mg | ORAL_TABLET | Freq: Every evening | ORAL | Status: DC | PRN
Start: 2020-05-27 — End: 2020-06-03

## 2020-05-27 MED ORDER — FAT EMULSION PLANT BASED 20% (INTRALIPID)IV EMUL
240.0000 mL | INTRAVENOUS | Status: AC
  Administered 2020-05-27: 240 mL via INTRAVENOUS
  Filled 2020-05-27: qty 240

## 2020-05-27 MED ORDER — AMLODIPINE BESYLATE 10 MG PO TABS
10.0000 mg | ORAL_TABLET | Freq: Every day | ORAL | Status: DC
  Administered 2020-05-27 – 2020-06-03 (×8): 10 mg via ORAL
  Filled 2020-05-27 (×8): qty 1

## 2020-05-27 MED ORDER — ENSURE ENLIVE PO LIQD
237.0000 mL | Freq: Two times a day (BID) | ORAL | Status: DC
  Administered 2020-05-28 – 2020-05-29 (×3): 237 mL via ORAL

## 2020-05-27 MED ORDER — TRACE MINERALS CU-MN-SE-ZN 300-55-60-3000 MCG/ML IV SOLN
INTRAVENOUS | Status: AC
  Filled 2020-05-27: qty 571.2

## 2020-05-27 NOTE — Progress Notes (Signed)
4 Days Post-Op    CC: Abdominal pain  Subjective: She says she is sore and complaining of some discomfort.  She has not been out of bed.  There is a wick in place.  She does have bowel sounds.  She thinks she is passing flatus and may have had a bowel movement yesterday but she is not sure.  Midline incision looks okay.  Band-Aids over the port sites are clean and dry.  Objective: Vital signs in last 24 hours: Temp:  [97.4 F (36.3 C)-99.5 F (37.5 C)] 98.4 F (36.9 C) (01/07 0353) Pulse Rate:  [83-95] 88 (01/07 0353) Resp:  [16-18] 17 (01/07 0353) BP: (148-169)/(77-98) 151/98 (01/07 0353) SpO2:  [96 %-100 %] 97 % (01/07 0827) Last BM Date: 05/25/20 310 p.o. 1600 IV 1500 urine No BM recorded Afebrile, vital signs are stable T-max 99.5 BMP is normal, potassium 4.5, magnesium 2.0 No imaging today   Intake/Output from previous day: 01/06 0701 - 01/07 0700 In: 1947.5 [P.O.:310; I.V.:1587.5; IV Piggyback:50] Out: 1500 [Urine:1500] Intake/Output this shift: No intake/output data recorded.  General appearance: alert, cooperative, no distress and Still seems a bit confused does not have much memory of yesterday's events. Resp: clear to auscultation bilaterally and Anterior exam GI: Soft, sore, incisions look fine.  Patient's thinks she is passing gas and had a BM yesterday.  Lab Results:  Recent Labs    05/25/20 0348 05/26/20 0500  WBC 12.1* 10.9*  HGB 12.2 11.0*  HCT 38.0 32.9*  PLT 247 230    BMET Recent Labs    05/26/20 0500 05/27/20 0424  NA 135 135  K 4.3 4.5  CL 103 101  CO2 24 26  GLUCOSE 172* 146*  BUN 15 14  CREATININE 0.65 0.73  CALCIUM 8.3* 8.6*   PT/INR No results for input(s): LABPROT, INR in the last 72 hours.  Recent Labs  Lab 05/22/20 1750 05/23/20 0404 05/26/20 0500  AST 18 18 18   ALT 13 14 17   ALKPHOS 64 59 60  BILITOT 0.9 0.8 0.6  PROT 6.3* 6.2* 5.6*  ALBUMIN 2.9* 2.8* 2.4*     Lipase     Component Value Date/Time   LIPASE  24 05/17/2020 1756     Medications: . Chlorhexidine Gluconate Cloth  6 each Topical Daily  . enoxaparin (LOVENOX) injection  40 mg Subcutaneous Q24H  . feeding supplement  1 Container Oral BID BM  . guaiFENesin-dextromethorphan  5 mL Oral Once  . insulin aspart  0-9 Units Subcutaneous Q6H  . mometasone-formoterol  2 puff Inhalation BID  . 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 Kieth Brightly - POD#4 - Bowel function returning, will trial clear liquids today. Continue TPN until able to reliably tolerate diet. - mobilize, PT/OT  ID -none FEN - CLD, TPN VTE -SCDs, lovenox Foley -none Follow up -staple removal, Dr. Kieth Brightly   Plan: Going to advance her to full liquids.  Continue TPN until she is taking adequate p.o. diet.  Increase time out of bed, bedside commode, increase ambulation.    LOS: 10 days    Jonluke Cobbins 05/27/2020 Please see Amion

## 2020-05-27 NOTE — Progress Notes (Addendum)
Occupational Therapy Treatment Patient Details Name: Angelica [name] MRN: [id number] DOB: 09-18-1940 Today's Date: 05/27/2020    History of present illness Pt adm with SBO. Also found incidental Covid+. Underwent diagnostic laparoscopy, open lysis of adhesions and enterrorhaphy on 05/23/20. PMH - HTN, SBO, thyroid disease, asthma.   OT comments  Pt making good progress with functional goals. Pt sat EOB min guard A, stood form EOB to RW min/min guard A. Pt ambulated to [facility] and was able to manage clothing and perform [place] hygiene min A standing at [facility] with RW. Pt transferred to recliner min guard A. OT will continue to follow acutely  Follow Up Recommendations  Home health OT;Supervision/Assistance - 24 hour    Equipment Recommendations  3 in 1 bedside commode, RW   Recommendations for Other Services      Precautions / Restrictions Precautions Precautions: Fall Restrictions Weight Bearing Restrictions: No       Mobility Bed Mobility Overal bed mobility: Needs Assistance Bed Mobility: Supine to Sit     Supine to sit: Min guard     General bed mobility comments: for safety, pt with use of HOB elevation and bedrails to perform.  Transfers Overall transfer level: Needs assistance Equipment used: Rolling walker (2 wheeled) Transfers: Sit to/from Stand Sit to Stand: Min assist;Min guard Stand pivot transfers: Min guard       General transfer comment: Min assist for power up, and steadying. Verbal cuing for hand placement    Balance Overall balance assessment: Needs assistance Sitting-balance support: No upper extremity supported;Feet supported Sitting balance-Leahy Scale: Fair     Standing balance support: Bilateral upper extremity supported;During functional activity Standing balance-Leahy Scale: Poor                             ADL either performed or assessed with clinical judgement   ADL Overall ADL's : Needs assistance/impaired     Grooming:  Wash/dry hands;Wash/dry face;Min guard;Standing   Upper Body Bathing: Supervision/ safety;Set up;Sitting   Lower Body Bathing: Moderate assistance;Sit to/from stand   Upper Body Dressing : Supervision/safety;Set up;Sitting       Toilet Transfer: Min guard;RW;Stand-pivot;BSC;Ambulation;Cueing for safety   Toileting- Clothing Manipulation and Hygiene: Minimal assistance;Sit to/from stand       Functional mobility during ADLs: Minimal assistance;Min guard;Cueing for safety;Rolling walker       Vision Baseline Vision/History: Wears glasses Wears Glasses: At all times Patient Visual Report: No change from baseline     Perception     Praxis      Cognition Arousal/Alertness: Awake/alert Behavior During Therapy: WFL for tasks assessed/performed Overall Cognitive Status: Within Functional Limits for tasks assessed Area of Impairment: Following commands;Problem solving                       Following Commands: Follows one step commands consistently;Follows one step commands with increased time Safety/Judgement: Decreased awareness of deficits   Problem Solving: Slow processing;Decreased initiation;Requires verbal cues          Exercises     Shoulder Instructions       General Comments      Pertinent Vitals/ Pain       Pain Assessment: No/denies pain Faces Pain Scale: Hurts a little bit Pain Location: abdomen, when encouraged to stand up straight Pain Descriptors / Indicators: Grimacing;Discomfort;Tightness Pain Intervention(s): Limited activity within patient's tolerance;Monitored during session;Repositioned  Home Living  Prior Functioning/Environment              Frequency  Min 2X/week        Progress Toward Goals  OT Goals(current goals can now be found in the care plan section)  Progress towards OT goals: Progressing toward goals  Acute Rehab OT Goals Patient Stated Goal: go  home  Plan Discharge plan remains appropriate    Co-evaluation                 AM-PAC OT "6 Clicks" Daily Activity     Outcome Measure   Help from another person eating meals?: None Help from another person taking care of personal grooming?: A Little Help from another person toileting, which includes using toliet, bedpan, or urinal?: A Little Help from another person bathing (including washing, rinsing, drying)?: A Lot Help from another person to put on and taking off regular upper body clothing?: None Help from another person to put on and taking off regular lower body clothing?: A Lot 6 Click Score: 18    End of Session Equipment Utilized During Treatment: Gait belt;Rolling walker;Other (comment) (BSC)  OT Visit Diagnosis: Muscle weakness (generalized) (M62.81);Unsteadiness on feet (R26.81);Pain Pain - part of body:  (abdomen)   Activity Tolerance Patient tolerated treatment well   Patient Left in chair;with call bell/phone within reach;with chair alarm set   Nurse Communication          Time: 6415-8309 OT Time Calculation (min): 20 min  Charges: OT General Charges $OT Visit: 1 Visit OT Treatments $Self Care/Home Management : 8-22 mins     Britt Bottom 05/27/2020, 3:23 PM

## 2020-05-27 NOTE — Progress Notes (Signed)
Nutrition Follow-up  DOCUMENTATION CODES:   Not applicable  INTERVENTION:   -TPN management per pharmacy -D/c Boost Breeze -Ensure Enlive po BID, each supplement provides 350 kcal and 20 grams of protein -RD will follow for diet advancement and adjust supplement regimen as appropriate  NUTRITION DIAGNOSIS:   Inadequate oral intake related to inability to eat as evidenced by NPO status.  Progressive; advanced to full liquids on 05/28/19  GOAL:   Patient will meet greater than or equal to 90% of their needs  Met with TPN  MONITOR:   PO intake,Supplement acceptance,Diet advancement,Labs,Weight trends,Skin,I & O's  REASON FOR ASSESSMENT:   Malnutrition Screening Tool    ASSESSMENT:   80 y.o. female with medical history significant for hypertension and thyroid disease, SBO in 2016 presents with abdominal pain, nausea, and vomiting. 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  Procedure (1/3):diagnostic laparoscopic, open lysis of adhesions, enterrorhaphy  1/4- NGT d/c 1/6- advanced to clear liquid diet 1/7- advanced to full liquid diet  Reviewed I/O's: +448 ml x 24 hours and +2.8 L since admission  UOP: 1.5 L x 24 hours   Pt unavailable at time of attempted contact.   Pt advanced to full liquid diet today, however, remains with minimal intake. Noted meal completions 20-40%. Pt consuming Boost Breeze supplements. Will trial Ensure Enlive due to diet advancement and increased nutrient density. Per MD notes, plan to continue with TPN until pt reliably able to demonstrate adequate PO intake.   Per pharmacy note, plan for 2 in 1 TPN on Tuesdays and Fridays due to allergy and to minimize waste during shortage. Pt receiving TPN at 70 ml/hr and Intralipids @ 20 ml/hr x 12 hours on Tuesday and  Friday; TPN provides weekly average of 1851 kcals and 86 grams protein daly, meeting 100% of estimated kcal and protein needs.   Labs reviewed: CBGS: 127-189 (inpatient orders for glycemic control are 0-9 units insulin aspart every 6 hours).   Diet Order:   Diet Order            Diet full liquid Room service appropriate? Yes; Fluid consistency: Thin  Diet effective now                 EDUCATION NEEDS:   Not appropriate for education at this time  Skin:  Skin Assessment: Skin Integrity Issues: Skin Integrity Issues:: Incisions Incisions: closed abdomen  Last BM:  05/26/19  Height:   Ht Readings from Last 1 Encounters:  05/19/20 $RemoveB'5\' 5"'EGbRNPEs$  (1.651 m)    Weight:   Wt Readings from Last 1 Encounters:  05/19/20 79.6 kg   BMI:  Body mass index is 29.2 kg/m.  Estimated Nutritional Needs:   Kcal:  1800-1950  Protein:  85-95 grams  Fluid:  >/= 1.8 L/day    Loistine Chance, RD, LDN, [place] Registered Dietitian II Certified Diabetes Care and Education Specialist Please refer to [facility] for RD and/or RD on-call/weekend/after hours pager

## 2020-05-27 NOTE — Progress Notes (Signed)
PHARMACY - TOTAL PARENTERAL NUTRITION CONSULT NOTE  Indication: Small bowel obstruction  Patient Measurements: Height: 5\' 5"  (165.1 cm) Weight: 79.6 kg (175 lb 7.8 oz) IBW/kg (Calculated) : 57   Body mass index is 29.2 kg/m.  Assessment:  29 YOF who presented on 12/28 with abd pain, N/V and was found via KUB and CT showed SBO thought to be due to adhesions. The patient previously had a SBO back in 2016 which required surgery to correct. D#6 of NPO, NGT placed 12/28 for decompression, bowel rest, pre-albumin low at 15.6 - pharmacy consulted for TPN management.  Glucose / Insulin: no hx DM - CBGs acceptable, used 7 units SSI yesterday Electrolytes: all WNL (K trending up, Mag 2 post 2gm) Renal: SCr < 1, BUN WNL LFTs / TGs: LFTs / tbili / TG WNL Prealbumin / albumin: pre-albumin 15.6, albumin 2.4 Intake / Output; MIVF: UOP 0.8 ml/kg/hr, pulled out NGT 1/3 d/t agitation, total I/O's inaccurate, LBM 1/5, +BS GI Imaging: 12/28 Abd CT: SBO likely d/t adhesions, no perf or evidence of ischemia 1/1 Abd X-ray: Persistent SBO w/ no evidence of improvement 1/2 Abd, x-ray - Improving bowel gas pattern with decreasing bowel distention since prior study 1/3 abd XR - dilated hemiabdomen small bowel loop Surgeries / Procedures:  1/3 ex-lap with open LoA and small bowel repair  Central access: PICC placed 05/22/20 TPN start date: 05/22/20  Nutritional Goals (per RD rec on 1/6): kCal: 1800-1950, Protein: 85-95, Fluid: >/=1.8L/day  Current Nutrition:  TPN Clear liquid diet started 1/6 - felt bloated/nauseous after meals >> advance to full liquid diet 1/7 Boost BID - none charted given yesterday  Plan:  Continue 2-in-1 TPN with Tues/Fri Intralipids due to allergy and to minimize waste during shortage Continue with Clinisol due to Travasol shortage  TPN at 70 ml/hr and Intralipids at 20 ml/hr x 12 hrs on Tues and Fri, providing a weekly average of 1851 kCal and 86gm protein per day, meeting 100% of  patient needs. Electrolytes in TPN: Na incr 70mEq/L on 1/6, decr K further to 29mEq/L, Ca 72mEq/L, Mag incr 73mEq/L on 1/6, Phos 5mmol/L, Cl:Ac 1:1 Add standard MVI and trace elements to TPN Continue sensitive SSI Q6H  Recheck labs on Sunday F/U with PO intake/diet advancement to start weaning TPN  Trygg Mantz D. Mina Marble, PharmD, BCPS, [place] 05/27/2020, 10:27 AM

## 2020-05-27 NOTE — Progress Notes (Signed)
PROGRESS NOTE                                                                                                                                                                                                             Patient Demographics:    Angelica [name], is a 80 y.o. female, DOB - Feb 27, 1941, XBL:[id number]  Outpatient Primary MD for the patient is [name], MD   Admit date - 05/17/2020   LOS - 10  Chief Complaint  Patient presents with  . Abdominal Pain       Brief Narrative: Patient is a 80 y.o. female with PMHx of HTN, severe persistent asthma (on benralizumab infusion every 8 weeks)-prior history of SBO-who presented to the hospital with abdominal pain, nausea/vomiting-she was found to have SBO and incidental COVID infection.  She was subsequently admitted to the hospitalist service.  COVID-19 vaccinated status: Fully vaccinated including booster.  Significant Events: 12/28>> Admit to [facility] for SBO  Significant studies: 12/28>> CT abdomen/pelvis: SBO with transition point in the mid central abdomen 12/28>> CT chest: Left lower lobe mass with left hilar/mediastinal adenopathy, enlarged/heterogeneous thyroid  COVID-19 medications: Monoclonal antibody: 12/29 x 1  Antibiotics: None  Microbiology data: None  Procedures: 1/3>> diagnostic laparoscopy-open lysis of adhesions   Consults: General surgery  DVT prophylaxis: enoxaparin (LOVENOX) injection 40 mg Start: 05/26/20 2000    Subjective:   Patient seen and examined today. Complains of sore abdomen. She is not sure if she is passing flatus but she is sure that she had 1 bowel movement yesterday.    Assessment  & Plan :   SBO: Secondary to lesions-s/p laparoscopy and lysis of adhesions-General surgery following and directing care. Advance diet to full liquid by general surgery and she continues to be on TPN.  COVID-19 infection:  Asymptomatic-s/p MAB infusion-apart from close monitoring-no other therapies indicated at this point.  Last day of isolation on 1/6-following which she no longer requires any further isolation.  HTN: Blood pressure elevated. Resume home dose of amlodipine and losartan.  GERD: On PPI  Left lung mass: Seen incidentally on imaging studies-will need pulmonary evaluation-bronchoscopy/biopsy-once she completely recovers from her acute illness. My colleague hospitalist discussed with pulmonology-Dr. Hebert Soho will arrange follow-up in the next 1-2 weeks with his office, and then a subsequent biopsy.  Epic message with patient demographics sent  to him as well.  Enlarged/heterogeneous thyroid gland: Seen incidentally on CT chest-we will need outpatient thyroid ultrasound.  History of severe persistent asthma: Stable-continue Symbicort-on as needed albuterol infusion.  Follows with allergy/immunology and is on on benralizumab infusion every 8 weeks. Resume montelukast. She takes as needed nightly.   ABG:    Component Value Date/Time   TCO2 25 08/22/2016 1818    Vent Settings: N/A    Condition -Stable  Family Communication  :  Daughter-[phone] updated over the phone on 1/6  Code Status :  Full Code  Diet :  Diet Order            Diet full liquid Room service appropriate? Yes; Fluid consistency: Thin  Diet effective now                  Disposition Plan  :   Status is: Inpatient  Remains inpatient appropriate because:Inpatient level of care appropriate due to severity of illness   Dispo: The patient is from: Home              Anticipated d/c is to: Home              Anticipated d/c date is: > 3 days              Patient currently is not medically stable to d/c.  Barriers to discharge: SBO-s/p laparoscopic lysis of adhesions-awaiting return of bowel function.  Antimicorbials  :    Anti-infectives (From admission, onward)   Start     Dose/Rate Route Frequency  Ordered Stop   05/23/20 1300  ceFAZolin (ANCEF) IVPB 2g/100 mL premix        2 g 200 mL/hr over 30 Minutes Intravenous On call to O.R. 05/23/20 1107 05/23/20 1448      Inpatient Medications  Scheduled Meds: . Chlorhexidine Gluconate Cloth  6 each Topical Daily  . enoxaparin (LOVENOX) injection  40 mg Subcutaneous Q24H  . feeding supplement  237 mL Oral BID BM  . guaiFENesin-dextromethorphan  5 mL Oral Once  . insulin aspart  0-9 Units Subcutaneous Q6H  . mometasone-formoterol  2 puff Inhalation BID  . pantoprazole (PROTONIX) IV  40 mg Intravenous Q24H   Continuous Infusions: . fat emul(INTRAlipid)    . methocarbamol (ROBAXIN) IV 500 mg (05/26/20 2305)  . TPN ADULT (ION) 70 mL/hr at 05/26/20 1727  . TPN ADULT (ION)     PRN Meds:.albuterol, EPINEPHrine, fentaNYL (SUBLIMAZE) injection, haloperidol lactate, labetalol, methocarbamol (ROBAXIN) IV, ondansetron **OR** ondansetron (ZOFRAN) IV, promethazine, simethicone, sodium chloride flush   Time Spent in minutes 30  See all Orders from today for further details   Darliss Cheney M.D on 05/27/2020 at 1:59 PM  To page go to [email or web address] - use universal password  [facility] -  Office  [phone]    Objective:   Vitals:   05/26/20 1715 05/26/20 2003 05/27/20 0353 05/27/20 0827  BP: (!) 160/95 (!) 155/85 (!) 151/98   Pulse: 84 95 88   Resp: 16 17 17    Temp: 99.5 F (37.5 C) 98.1 F (36.7 C) 98.4 F (36.9 C)   TempSrc:      SpO2: 100% 96% 96% 97%  Weight:      Height:        Wt Readings from Last 3 Encounters:  05/19/20 79.6 kg  05/17/20 81.6 kg  05/03/20 82.6 kg     Intake/Output Summary (Last 24 hours) at 05/27/2020 1359 Last data filed at 05/27/2020 0600 Gross per  24 hour  Intake 1697.53 ml  Output 1300 ml  Net 397.53 ml     Physical Exam General exam: Appears calm and comfortable  Respiratory system: Clear to auscultation. Respiratory effort normal. Cardiovascular system: S1 & S2 heard, RRR. No JVD,  murmurs, rubs, gallops or clicks. No pedal edema. Gastrointestinal system: Abdomen is nondistended, soft and very mild generalized tenderness. No organomegaly or masses felt. Normal bowel sounds heard. Central nervous system: Alert and oriented. No focal neurological deficits. Extremities: Symmetric 5 x 5 power. Skin: No rashes, lesions or ulcers.  Psychiatry: Judgement and insight appear normal. Mood & affect appropriate.     Data Review:    CBC Recent Labs  Lab 05/22/20 1800 05/23/20 0404 05/25/20 0348 05/26/20 0500  WBC 6.8 5.4 12.1* 10.9*  HGB 13.3 13.3 12.2 11.0*  HCT 41.6 41.5 38.0 32.9*  PLT 237 245 247 230  MCV 92.4 93.0 92.5 91.1  MCH 29.6 29.8 29.7 30.5  MCHC 32.0 32.0 32.1 33.4  RDW 13.4 13.4 13.6 13.4  LYMPHSABS 1.4 1.4  --   --   MONOABS 0.9 0.8  --   --   EOSABS 0.0 0.0  --   --   BASOSABS 0.0 0.0  --   --     Chemistries  Recent Labs  Lab 05/22/20 1750 05/23/20 0404 05/24/20 0417 05/25/20 0348 05/26/20 0500 05/27/20 0424  NA 144 144 142 138 135 135  K 3.3* 3.3* 3.6 4.0 4.3 4.5  CL 111 110 111 109 103 101  CO2 22 24 23 23 24 26   GLUCOSE 101* 163* 191* 91 172* 146*  BUN 19 21 22 21 15 14   CREATININE 0.79 0.75 0.76 0.66 0.65 0.73  CALCIUM 8.9 8.9 8.5* 8.2* 8.3* 8.6*  MG 2.1 2.1 1.9 2.0 1.7 2.0  AST 18 18  --   --  18  --   ALT 13 14  --   --  17  --   ALKPHOS 64 59  --   --  60  --   BILITOT 0.9 0.8  --   --  0.6  --    ------------------------------------------------------------------------------------------------------------------ No results for input(s): CHOL, HDL, LDLCALC, TRIG, CHOLHDL, LDLDIRECT in the last 72 hours.  Lab Results  Component Value Date   HGBA1C 5.8 05/03/2020   ------------------------------------------------------------------------------------------------------------------ No results for input(s): TSH, T4TOTAL, T3FREE, THYROIDAB in the last 72 hours.  Invalid input(s):  FREET3 ------------------------------------------------------------------------------------------------------------------ No results for input(s): VITAMINB12, FOLATE, FERRITIN, TIBC, IRON, RETICCTPCT in the last 72 hours.  Coagulation profile No results for input(s): INR, PROTIME in the last 168 hours.  No results for input(s): DDIMER in the last 72 hours.  Cardiac Enzymes No results for input(s): CKMB, TROPONINI, MYOGLOBIN in the last 168 hours.  Invalid input(s): CK ------------------------------------------------------------------------------------------------------------------ No results found for: BNP  Micro Results Recent Results (from the past 240 hour(s))  Resp Panel by RT-PCR (Flu A&B, Covid) Nasopharyngeal Swab     Status: Abnormal   Collection Time: 05/17/20  8:48 PM   Specimen: Nasopharyngeal Swab; Nasopharyngeal(NP) swabs in vial transport medium  Result Value Ref Range Status   SARS Coronavirus 2 by RT PCR POSITIVE (A) NEGATIVE Final    Comment: RESULT CALLED TO, READ BACK BY AND VERIFIED WITH: J NEWTON RN 05/17/20 2213 JDW (NOTE) SARS-CoV-2 target nucleic acids are DETECTED.  The SARS-CoV-2 RNA is generally detectable in upper respiratory specimens during the acute phase of infection. Positive results are indicative of the presence of the identified virus, but  do not rule out bacterial infection or co-infection with other pathogens not detected by the test. Clinical correlation with patient history and other diagnostic information is necessary to determine patient infection status. The expected result is Negative.  Fact Sheet for Patients: EntrepreneurPulse.com.au  Fact Sheet for Healthcare Providers: IncredibleEmployment.be  This test is not yet approved or cleared by the [place] FDA and  has been authorized for detection and/or diagnosis of SARS-CoV-2 by FDA under an Emergency Use Authorization (EUA).  This EUA  will remain in effect (meaning this test can be use d) for the duration of  the COVID-19 declaration under Section 564(b)(1) of the Act, 21 U.S.C. section 360bbb-3(b)(1), unless the authorization is terminated or revoked sooner.     Influenza A by PCR NEGATIVE NEGATIVE Final   Influenza B by PCR NEGATIVE NEGATIVE Final    Comment: (NOTE) The Xpert Xpress SARS-CoV-2/FLU/RSV plus assay is intended as an aid in the diagnosis of influenza from Nasopharyngeal swab specimens and should not be used as a sole basis for treatment. Nasal washings and aspirates are unacceptable for Xpert Xpress SARS-CoV-2/FLU/RSV testing.  Fact Sheet for Patients: EntrepreneurPulse.com.au  Fact Sheet for Healthcare Providers: IncredibleEmployment.be  This test is not yet approved or cleared by the [place] FDA and has been authorized for detection and/or diagnosis of SARS-CoV-2 by FDA under an Emergency Use Authorization (EUA). This EUA will remain in effect (meaning this test can be used) for the duration of the COVID-19 declaration under Section 564(b)(1) of the Act, 21 U.S.C. section 360bbb-3(b)(1), unless the authorization is terminated or revoked.  Performed at [facility], [place] [address], [place], [place] [address]     Radiology Reports DG Abd 1 View  Result Date: 05/21/2020 CLINICAL DATA:  80 year old female with small bowel obstruction. EXAM: ABDOMEN - 1 VIEW COMPARISON:  CT Abdomen and Pelvis 05/17/2020. Postcontrast abdominal radiographs 05/19/2020. FINDINGS: Portable AP supine views since 05/19/2020 the oral contrast has become dilute throughout the abdomen and pelvis. Still, faint contrast is visible within a dilated left abdominal loop seen previously (arrow). And there is no convincing large bowel contrast. Stable enteric tube visible in the epigastrium. Stable abdominal and pelvic visceral contours. Stable visualized osseous structures. At 1313  hours. IMPRESSION: 1. Persistent small bowel obstruction, with no evidence of improvement since 05/19/2020. 2. Stable enteric tube. Electronically Signed   By: Genevie Ann M.D.   On: 05/21/2020 13:31   CT CHEST W CONTRAST  Result Date: 05/17/2020 CLINICAL DATA:  May 17, 2020, abdominal CT. FINDINGS: Cardiovascular: Atheromatous plaque in the thoracic aorta. No aneurysmal dilation. Noncalcified plaque as well. Normal heart size. Three-vessel coronary artery disease. Central pulmonary vasculature unremarkable on venous phase assessment. Mediastinum/Nodes: Heterogeneous and enlarged thyroid gland, multinodular changes. No axillary lymphadenopathy. Enlarged peribronchial lymph node on image 69 of series 3 a 13 mm. Borderline enlarged LEFT infrahilar lymph node at 9 mm on image 71 of series 3. Subcarinal lymph node at 14 mm on image 61 of series 3. No additional mediastinal or hilar nodal enlargement. Esophagus mildly patulous. Lungs/Pleura: Pulmonary mass measuring 3.5 x 2.8 cm on image 94 of series 4 with spiculated margins contacting the visceral pleura in the LEFT lower lobe, situated in the medial aspect of the LEFT lower lobe. Direct extension of the mass versus is infra hilar lymph node on image 88 of series 4 measuring 13 mm. Bronchial narrowing of secondary bronchial structures at the level of the mass beneath the LEFT hilum. Airways are  otherwise patent. Tiny nodule in the RIGHT chest on image 75 series 4 approximately 2-3 mm. Upper Abdomen: Incidental imaging of upper abdominal contents again with small amount of perisplenic fluid and marked small bowel dilation. Visible adrenal glands are normal. There is excreted contrast within the kidneys partially visualized. Musculoskeletal: No acute musculoskeletal finding. No destructive bone lesion. Spinal degenerative changes. IMPRESSION: 1. LEFT lower lobe mass with LEFT hilar and mediastinal adenopathy most suggestive of bronchogenic carcinoma. Referral to  multi disciplinary thoracic oncologic setting with PET or biopsy for further assessment is suggested. 2. Tiny 2-3 mm nodule in the RIGHT chest on image 75 series 4. Attention on follow-up. 3. Signs of bowel obstruction and fluid in the upper abdomen about the spleen partially imaged. 4. Enlarged and heterogeneous thyroid. Recommend thyroid ultrasound (ref: J Am Coll Radiol. 2015 Feb;12(2): 143-50). 5. Three-vessel coronary artery disease. 6. Aortic atherosclerosis Aortic Atherosclerosis (ICD10-I70.0). Electronically Signed   By: Zetta Bills M.D.   On: 05/17/2020 21:21   CT ABDOMEN PELVIS W CONTRAST  Result Date: 05/17/2020 CLINICAL DATA:  Abdominal pain. Vomiting. Possible bowel obstruction on radiograph. EXAM: CT ABDOMEN AND PELVIS WITH CONTRAST TECHNIQUE: Multidetector CT imaging of the abdomen and pelvis was performed using the standard protocol following bolus administration of intravenous contrast. CONTRAST:  17mL OMNIPAQUE IOHEXOL 300 MG/ML  SOLN COMPARISON:  Radiographs earlier today.  CT 02/04/2017 FINDINGS: Lower chest: Lobulated 2.5 cm left lower lobe pulmonary nodule with irregular margins, increased in size from prior exam. No pleural fluid or acute airspace disease. Hepatobiliary: Mild hepatic steatosis. No focal hepatic lesion. Gallbladder physiologically distended, no calcified stone. No biliary dilatation. Pancreas: Parenchymal atrophy. No ductal dilatation or inflammation. Spleen: Normal in size without focal abnormality. Adrenals/Urinary Tract: Normal adrenal glands. No hydronephrosis or perinephric edema. Homogeneous renal enhancement with symmetric excretion on delayed phase imaging. Tiny hypodensity in the upper left kidney is too small to accurately characterize. Urinary bladder is physiologically distended without wall thickening. Slight cystocele. Stomach/Bowel: Fluid-filled distended gastric cardia. Dilated fluid-filled proximal small bowel. Transition from dilated to nondilated in  the mid central abdomen, series 4, image 51. More distal small bowel is decompressed. Mild mesenteric edema and small free fluid. No pneumatosis. The appendix is not confidently visualized on the current exam. Majority of the colon is decompressed. Occasional descending and sigmoid diverticula without diverticulitis. There is pelvic floor descent with small-bowel loops coursing posterior to the bladder. Vascular/Lymphatic: No acute vascular findings are evidence of vascular compromise. Aortic atherosclerosis. Patent portal vein. Patent mesenteric vessels. No enlarged lymph nodes in the abdomen or pelvis. Reproductive: Status post hysterectomy. No adnexal masses. Other: Small volume of non organized free fluid in both upper quadrants, scattered throughout the mesentery and in the pelvis. No free air or perforation. No focal fluid collection/abscess. Small fat containing upper ventral abdominal wall hernia. Musculoskeletal: Diffuse lumbar degenerative change. No acute osseous abnormality or focal osseous lesion. Scattered subcutaneous granuloma in both gluteal regions/flanks. IMPRESSION: 1. Small bowel obstruction with transition point in the mid central abdomen, likely due to adhesions. Small volume of non organized free fluid in the abdomen and pelvis. No perforation or evidence of ischemia. 2. Lobulated 2.5 cm left lower lobe pulmonary nodule with irregular margins, increased in size from prior exam. This is suspicious for primary bronchogenic malignancy. Recommend dedicated chest CT (with IV contrast) for complete evaluation of the thorax. 3. Mild hepatic steatosis. 4. Pelvic floor descent with small-bowel loops coursing posterior to the bladder. Aortic Atherosclerosis (ICD10-I70.0). Electronically Signed  By: Keith Rake M.D.   On: 05/17/2020 19:40   DG ABD ACUTE 2+V W 1V CHEST  Result Date: 05/17/2020 CLINICAL DATA:  Chronic upper abdominal pain EXAM: DG ABDOMEN ACUTE WITH 1 VIEW CHEST COMPARISON:   08/15/2017 FINDINGS: Supine and upright frontal views of the abdomen and pelvis as well as an upright frontal view of the chest are obtained. Cardiac silhouette is stable. No airspace disease, effusion, or pneumothorax. There is a dilated loop of distended gas-filled small bowel within the left upper quadrant, measuring up to 5.2 cm in diameter. Gas fluid level seen within the left upper quadrant. There is a paucity of distal small bowel gas and colonic gas. No free gas in the greater peritoneal sac. No masses or abnormal calcifications. IMPRESSION: 1. Findings compatible with high-grade small bowel obstruction. 2. No acute intrathoracic process. Electronically Signed   By: Randa Ngo M.D.   On: 05/17/2020 16:24   DG Abd Portable 1V  Result Date: 05/23/2020 CLINICAL DATA:  SBO EXAM: PORTABLE ABDOMEN - 1 VIEW COMPARISON:  05/22/2020 and prior. FINDINGS: Dilated right hemiabdomen small bowel loop measuring up to 5.3 cm. No evidence of pneumoperitoneum. Multilevel spondylosis. Bilateral body wall calcified granulomas. IMPRESSION: Dilated right hemiabdomen small bowel loop measuring up to 5.3 cm. Electronically Signed   By: Primitivo Gauze M.D.   On: 05/23/2020 08:43   DG Abd Portable 1V  Result Date: 05/22/2020 CLINICAL DATA:  Vomiting, abdominal pain EXAM: PORTABLE ABDOMEN - 1 VIEW COMPARISON:  05/21/2020 FINDINGS: NG tube remains in the stomach. Decreasing bowel distension. No organomegaly or free air. Small left pleural effusion noted with left base atelectasis. IMPRESSION: Improving bowel gas pattern with decreasing bowel distention since prior study. NG tube remains in the stomach. Electronically Signed   By: Rolm Baptise M.D.   On: 05/22/2020 11:33   DG Abd Portable 1V-Small Bowel Obstruction Protocol-initial, 8 hr delay  Result Date: 05/19/2020 CLINICAL DATA:  Small-bowel obstruction EXAM: PORTABLE ABDOMEN - 1 VIEW COMPARISON:  05/18/2020 FINDINGS: Two supine frontal views of the abdomen and  pelvis are obtained. Enteric catheter tip and side port project over the gastric fundus. There is excreted contrast within the bladder related to previous CT. Oral contrast is seen within the stomach and duodenal sweep. Dilute contrast is seen filling dilated loops of small bowel within the left mid abdomen. No evidence of distal small bowel or colonic contrast. IMPRESSION: 1. No evidence of colonic contrast. Persistent dilated small bowel consistent with small-bowel obstruction. Electronically Signed   By: Randa Ngo M.D.   On: 05/19/2020 20:18   DG Abd Portable 1V-Small Bowel Obstruction Protocol-initial, 8 hr delay  Result Date: 05/18/2020 CLINICAL DATA:  Small bowel obstruction EXAM: PORTABLE ABDOMEN - 1 VIEW COMPARISON:  05/17/2020 FINDINGS: Enteric tube has been placed with tip in the left upper quadrant. The proximal side hole projects over the EG junction region, possibly at or just below the EG junction. Advancement is suggested. Persistent gaseous distention of left upper quadrant bowel, similar to prior study. Residual contrast material in the bladder. IMPRESSION: Enteric tube tip is in the left upper quadrant with proximal side hole at or just below the EG junction. Advancement is suggested. Electronically Signed   By: Lucienne Capers M.D.   On: 05/18/2020 17:22   DG Abd Portable 1 View  Result Date: 05/17/2020 CLINICAL DATA:  Nasogastric tube placement EXAM: PORTABLE ABDOMEN - 1 VIEW COMPARISON:  05/17/2020 FINDINGS: Nasogastric tube extends into the upper abdomen with its tip  overlying the expected proximal to mid body of the stomach. Visualized lung bases are clear. Dilated loops of gas-filled small bowel are seen within the left upper quadrant suggestive of underlying small-bowel obstruction, however, the mid and inferior abdomen are excluded from view. No gross free intraperitoneal gas within the visualized abdomen. IMPRESSION: Nasogastric tube extends into the proximal to mid body of  the stomach. Electronically Signed   By: Fidela Salisbury MD   On: 05/17/2020 23:03   [place] EKG SITE RITE  Result Date: 05/22/2020 If Site Rite image not attached, placement could not be confirmed due to current cardiac rhythm.

## 2020-05-27 NOTE — Care Management (Signed)
    Durable Medical Equipment  (From admission, onward)         Start     Ordered   05/27/20 1214  For home use only DME 3 n 1  Once        05/27/20 1213   05/27/20 1213  For home use only DME standard manual wheelchair with seat cushion  Once       Comments: Patient suffers from SBO: Secondary to lesions-s/p laparoscopy and lysis of adhesions which impairs their ability to perform daily activities like ambulating  in the home.  A cane will not resolve issue with performing activities of daily living. A wheelchair will allow patient to safely perform daily activities. Patient can safely propel the wheelchair in the home or has a caregiver who can provide assistance. Length of need lifetime . Accessories: elevating leg rests (ELRs), wheel locks, extensions and anti-tippers.  Seat and back cushions   05/27/20 1213

## 2020-05-27 NOTE — TOC Initial Note (Addendum)
Transition of Care [facility]) - Initial/Assessment Note    Patient Details  Name: Angelica [name] MRN: [id number] Date of Birth: 1941/01/13  Transition of Care [facility]) CM/SW Contact:    Marilu Favre, RN Phone Number: 05/27/2020, 11:59 AM  Clinical Narrative:                 Patient from home with daughter and grandson.   Discussed PT/OT recommendations for HHPT/OT and 24 hour supervision. Patient lives with her daughter and grandson.   Provided medicare.gov list of home health agencies. Patient will discuss with her daughter. NCM left NCM contact information on the medicare.gov list.   Patient in agreement for 3 in1 and wheel chair. NCM will place orders for same and call [place] . Per note patient used a rolling walker , but no rolling walker recommended . Patient stated she did not use walker.   Spoke to daughter [name] on phone. Discussed all of above. Taron wants her mother to have a rolator also , messaged therapy to see if they recommend same. Taron would like [employer] to call her directly regarding cost/coverage. Freda Munro with [employer]. Messaged and called PT office left voicemail regarding rolator . Ordered rollater, asked MD to sign. [employer]   [name] will review home health list and call NCM back this afternoon with choices.   Per Taron family can provide 24 hour supervision   [place] home health choices : [facility], [facility] with Alvis Lemmings accepted referral  Expected Discharge Plan: [place] Barriers to Discharge: Continued Medical Work up   Patient Goals and CMS Choice Patient states their goals for this hospitalization and ongoing recovery are:: to return to home CMS Medicare.gov Compare Post Acute Care list provided to:: Patient Choice offered to / list presented to : Patient  Expected Discharge Plan and Services Expected Discharge Plan: [place]   Discharge Planning Services:  CM Consult Post Acute Care Choice: Home Health,Durable Medical Equipment Living arrangements for the past 2 months: Single Family Home                 DME Arranged: 3-N-1,Wheelchair manual         HH Arranged: PT,OT          Prior Living Arrangements/Services Living arrangements for the past 2 months: Single Family Home Lives with:: Adult Children Patient language and need for interpreter reviewed:: Yes Do you feel safe going back to the place where you live?: Yes      Need for Family Participation in Patient Care: Yes (Comment) Care giver support system in place?: Yes (comment)   Criminal Activity/Legal Involvement Pertinent to Current Situation/Hospitalization: No - Comment as needed  Activities of Daily Living Home Assistive Devices/Equipment: None ADL Screening (condition at time of admission) Patient's cognitive ability adequate to safely complete daily activities?: Yes Is the patient deaf or have difficulty hearing?: No Does the patient have difficulty seeing, even when wearing glasses/contacts?: No Does the patient have difficulty concentrating, remembering, or making decisions?: No Patient able to express need for assistance with ADLs?: Yes Does the patient have difficulty dressing or bathing?: No Independently performs ADLs?: Yes (appropriate for developmental age) Does the patient have difficulty walking or climbing stairs?: Yes Weakness of Legs: Both Weakness of Arms/Hands: None  Permission Sought/Granted   Permission granted to share information with : No  Emotional Assessment Appearance:: Appears stated age     Orientation: : Oriented to Situation,Oriented to  Time,Oriented to Place,Oriented to Self Alcohol / Substance Use: Not Applicable Psych Involvement: No (comment)  Admission diagnosis:  Small bowel obstruction (HCC) [K56.609] SBO (small bowel obstruction) ([place]) [K56.609] Pulmonary nodule [R91.1] Encounter for imaging study to  confirm nasogastric (NG) tube placement [Z01.89] Patient Active Problem List   Diagnosis Date Noted  . Epigastric abdominal tenderness with rebound tenderness 05/17/2020  . Pulmonary nodule 05/17/2020  . Iron deficiency 05/03/2020  . Dizziness 01/19/2020  . Left rotator cuff tear 11/12/2019  . Trigger thumb of right hand 11/12/2019  . Trigger finger, left ring finger 11/12/2019  . Left shoulder pain 09/29/2019  . Swelling of lower extremity 07/30/2019  . Greater trochanteric bursitis of left hip 04/07/2019  . Right ankle swelling 02/18/2019  . Ganglion cyst of right foot 01/16/2019  . HLD (hyperlipidemia) 01/16/2019  . 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 Urticaria 07/25/2018  . Low back pain 07/09/2018  . Hyperglycemia 07/09/2018  . Rash 10/10/2017  . Constipation 08/19/2017  . Hypokalemia 07/09/2017  . Asthma, severe persistent, well-controlled 06/04/2017  . 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 Nasal polyposis 06/04/2017  . Leukocytosis 06/04/2017  . Osteopenia 05/26/2017  . Encounter for well adult exam with abnormal findings 05/09/2017  . Glaucoma 05/09/2017  . GERD (gastroesophageal reflux disease)   . H. pylori infection   . Other allergic rhinitis 03/20/2016  . Cough, persistent 03/20/2016  . Hypoglycemia 04/17/2015  . Small bowel obstruction ([place])   . Malnutrition of moderate degree 04/11/2015  . SBO (small bowel obstruction) ([place]) 04/10/2015  . HTN (hypertension) 04/10/2015   PCP:  [name], MD Pharmacy:   [facility], [place] [address] Phone: [phone] Fax: [facility] [place], [place] [address] Phone: [phone] Fax: [phone]     Social Determinants of Health (SDOH) Interventions    Readmission Risk Interventions No flowsheet data found.

## 2020-05-28 DIAGNOSIS — K56609 Unspecified intestinal obstruction, unspecified as to partial versus complete obstruction: Secondary | ICD-10-CM | POA: Diagnosis not present

## 2020-05-28 LAB — GLUCOSE, CAPILLARY
Glucose-Capillary: 138 mg/dL — ABNORMAL HIGH (ref 70–99)
Glucose-Capillary: 164 mg/dL — ABNORMAL HIGH (ref 70–99)
Glucose-Capillary: 168 mg/dL — ABNORMAL HIGH (ref 70–99)
Glucose-Capillary: 110 mg/dL — ABNORMAL HIGH (ref 70–99)

## 2020-05-28 MED ORDER — TRACE MINERALS CU-MN-SE-ZN 300-55-60-3000 MCG/ML IV SOLN
INTRAVENOUS | Status: AC
  Filled 2020-05-28: qty 285.6

## 2020-05-28 NOTE — Progress Notes (Signed)
Physical Therapy Treatment Patient Details Name: Angelica [name] MRN: [id number] DOB: 1941/03/25 Today's Date: 05/28/2020    History of Present Illness Pt adm with SBO. Also found incidental Covid+. Underwent diagnostic laparoscopy, open lysis of adhesions and enterrorhaphy on 05/23/20. PMH - HTN, SBO, thyroid disease, asthma. Now off Covid precs (confirmed per RN with ID 1/8).    PT Comments    Pt up in chair on arrival, agreeable to therapy session and with good participation and fair tolerance for mobility. Pt limited due to symptoms of orthostatic hypotension this session, but participated in seated therapeutic exercises and transfer training as able. Pt performed seated and limited standing AROM therapeutic exercises as detailed below, needing multimodal cues for proper technique/reps due to slow processing/fatigue. Pt continues to benefit from PT services to progress toward functional mobility goals. D/C recs below remain appropriate per recommendation from PT Santiago Glad H in previous session, pt agreeable to consider post-acute rehab for strengthening due to continued debility and decreased activity tolerance. Orthostatic BPs  Sitting 116/64 (78); HR 80 bpm; SpO2 100%  Standing 86/67 (76)*; HR 84 bpm; SpO2 98%  After return to Sitting 110/67 (81); HR 85 bpm; SpO2 100%    *RN/MD notified  Follow Up Recommendations  Home health PT;Supervision/Assistance - 24 hour;SNF     Equipment Recommendations  None recommended by PT    Recommendations for Other Services       Precautions / Restrictions Precautions Precautions: Fall Precaution Comments: monitor BP Restrictions Weight Bearing Restrictions: No    Mobility  Bed Mobility                  Transfers Overall transfer level: Needs assistance Equipment used: Rolling walker (2 wheeled) Transfers: Sit to/from Stand Sit to Stand: Min assist         General transfer comment: Min assist for power up, and steadying. Verbal  cuing for hand placement and use of momentum strategy to rise; increased time for hip extension  Ambulation/Gait             General Gait Details: unable to progress gait distance due to orthostatic hypotension symptoms after pre-gait hip flexion x6 reps   Stairs             Wheelchair Mobility    Modified Rankin (Stroke Patients Only)       Balance Overall balance assessment: Needs assistance Sitting-balance support: No upper extremity supported;Feet supported Sitting balance-Leahy Scale: Fair     Standing balance support: Bilateral upper extremity supported;During functional activity Standing balance-Leahy Scale: Poor Standing balance comment: reliant on external assist and RW                            Cognition Arousal/Alertness: Lethargic Behavior During Therapy: WFL for tasks assessed/performed Overall Cognitive Status: Within Functional Limits for tasks assessed Area of Impairment: Following commands;Problem solving                     Memory: Decreased short-term memory Following Commands: Follows one step commands consistently;Follows one step commands with increased time Safety/Judgement: Decreased awareness of deficits   Problem Solving: Slow processing;Decreased initiation;Requires verbal cues General Comments: drowsy this date, participatory as able but increased processing time needed and limited carryover of cues for UE placement/sequencing      Exercises General Exercises - Lower Extremity Ankle Circles/Pumps: AROM;Both;10 reps;Seated Long Arc Quad: AROM;Strengthening;Both;10 reps;Seated Hip Flexion/Marching: AROM;Strengthening;Both;Seated;Standing;Other reps (comment) (x6 reps standing and  x10 reps seated)    General Comments General comments (skin integrity, edema, etc.): see ortho BP in assessment above; sxs dizziness and fatigue in stance      Pertinent Vitals/Pain Pain Assessment: 0-10 Pain Score: 2  Pain  Location: abdomen, when encouraged to stand up straight Pain Descriptors / Indicators: Grimacing;Discomfort;Tightness Pain Intervention(s): Monitored during session;Repositioned (cues for slow deep breaths)    Home Living                      Prior Function            PT Goals (current goals can now be found in the care plan section) Acute Rehab PT Goals Patient Stated Goal: go home or rehab to get stronger PT Goal Formulation: With patient Time For Goal Achievement: 06/07/20 Potential to Achieve Goals: Good Progress towards PT goals: Progressing toward goals (slow progress; sxs limiting)    Frequency    Min 3X/week      PT Plan Current plan remains appropriate    Co-evaluation              AM-PAC PT "6 Clicks" Mobility   Outcome Measure  Help needed turning from your back to your side while in a flat bed without using bedrails?: A Lot Help needed moving from lying on your back to sitting on the side of a flat bed without using bedrails?: A Lot Help needed moving to and from a bed to a chair (including a wheelchair)?: A Lot Help needed standing up from a chair using your arms (e.g., wheelchair or bedside chair)?: A Little Help needed to walk in hospital room?: A Lot Help needed climbing 3-5 steps with a railing? : Total 6 Click Score: 12    End of Session Equipment Utilized During Treatment: Gait belt Activity Tolerance: Patient limited by fatigue Patient left: in chair;with call bell/phone within reach;with chair alarm set Nurse Communication: Mobility status PT Visit Diagnosis: Other abnormalities of gait and mobility (R26.89);Pain Pain - part of body:  (abdomen)     Time: 6503-5465 PT Time Calculation (min) (ACUTE ONLY): 26 min  Charges:  $Therapeutic Exercise: 8-22 mins $Therapeutic Activity: 8-22 mins                     Daneka Lantigua P., PTA Acute Rehabilitation Services Pager: [phone] Office: [place] 05/28/2020,  4:13 PM

## 2020-05-28 NOTE — Progress Notes (Signed)
PHARMACY - TOTAL PARENTERAL NUTRITION CONSULT NOTE  Indication: Small bowel obstruction  Patient Measurements: Height: 5\' 5"  (165.1 cm) Weight: 79.6 kg (175 lb 7.8 oz) IBW/kg (Calculated) : 57   Body mass index is 29.2 kg/m.  Assessment:  35 YOF who presented on 12/28 with abd pain, N/V and was found via KUB and CT showed SBO thought to be due to adhesions. The patient previously had a SBO back in 2016, which required surgery to correct. D#6 of NPO, NGT placed 12/28 for decompression, bowel rest, pre-albumin low at 15.6 - pharmacy consulted for TPN management.  Glucose / Insulin: no hx DM - CBGs acceptable, used 7 units SSI in last 24hrs Electrolytes: all WNL (last 1/7) Renal: SCr < 1, BUN WNL LFTs / TGs: LFTs / tbili / TG WNL Prealbumin / albumin: pre-albumin 15.6, albumin 2.4 Intake / Output; MIVF: UOP 0.3 ml/kg/hr per documentation, pulled out NGT 1/3 d/t agitation, total I/O's inaccurate, LBM 1/5 GI Imaging: 12/28 Abd CT: SBO likely d/t adhesions, no perf or evidence of ischemia 1/1 Abd X-ray: Persistent SBO w/ no evidence of improvement 1/2 Abd, x-ray - Improving bowel gas pattern with decreasing bowel distention since prior study 1/3 abd XR - dilated hemiabdomen small bowel loop Surgeries / Procedures: 1/3 ex-lap with open LoA and small bowel repair  Central access: PICC placed 05/22/20 TPN start date: 05/22/20  Nutritional Goals (per RD rec on 1/6): kCal: 1800-1950, Protein: 85-95g, Fluid: >/=1.8L/day  Current Nutrition:  TPN Advancing to soft diet 1/8 Ensure Enlive PO BID - none given so far  Plan:  Continue 2-in-1 TPN with Tues/Fri Intralipids due to allergy and to minimize waste during shortage Continue with Clinisol due to Travasol shortage  Wean TPN to 1/2 rate today at 1800 per discussion with Dr. Marcello Moores. Monitor PO intake. TPN to continue at 35 ml/hr today and Intralipids at 20 ml/hr x 12 hrs on Tues and Fri Electrolytes in TPN: continue same today (lytes will  decrease with rate decrease) - Na 39mEq/L, K 13mEq/L, Ca 49mEq/L, Mag 37mEq/L, Phos 11mmol/L, Cl:Ac 1:1 Add standard MVI and trace elements to TPN Continue sensitive SSI Q6H and adjust as needed Monitor TPN labs F/U with PO intake/diet advancement to d/c TPN   Arturo Morton, PharmD, BCPS Please check AMION for all [place] contact numbers Clinical Pharmacist 05/28/2020 9:02 AM

## 2020-05-28 NOTE — Progress Notes (Signed)
PROGRESS NOTE                                                                                                                                                                                                             Patient Demographics:    Angelica [name], is a 80 y.o. female, DOB - November 01, 1940, BOF:[id number]  Outpatient Primary MD for the patient is [name], MD   Admit date - 05/17/2020   LOS - 65  Chief Complaint  Patient presents with  . Abdominal Pain       Brief Narrative: Patient is a 80 y.o. female with PMHx of HTN, severe persistent asthma (on benralizumab infusion every 8 weeks)-prior history of SBO-who presented to the hospital with abdominal pain, nausea/vomiting-she was found to have SBO and incidental COVID infection.  She was subsequently admitted to the hospitalist service.  COVID-19 vaccinated status: Fully vaccinated including booster.  Significant Events: 12/28>> Admit to [facility] for SBO  Significant studies: 12/28>> CT abdomen/pelvis: SBO with transition point in the mid central abdomen 12/28>> CT chest: Left lower lobe mass with left hilar/mediastinal adenopathy, enlarged/heterogeneous thyroid  COVID-19 medications: Monoclonal antibody: 12/29 x 1  Antibiotics: None  Microbiology data: None  Procedures: 1/3>> diagnostic laparoscopy-open lysis of adhesions   Consults: General surgery  DVT prophylaxis:     Subjective:   Seen and examined.  Feels much better.  No abdominal pain no nausea.  Tolerated full liquid diet.  She is passing flatus.  She also thinks that she had another bowel movement in last 24 hours.   Assessment  & Plan :   SBO: Secondary to lesions-s/p laparoscopy and lysis of adhesions-General surgery following and directing care.  She is feeling better.  General surgery advised her to soft diet today she continues to be on TPN.  COVID-19 infection: Asymptomatic-s/p  MAB infusion-apart from close monitoring-no other therapies indicated at this point.  Last day of isolation on 1/6-following which she no longer requires any further isolation.  HTN: Blood pressure better controlled.  Continue home dose of amlodipine and losartan.  GERD: On PPI  Left lung mass: Seen incidentally on imaging studies-will need pulmonary evaluation-bronchoscopy/biopsy-once she completely recovers from her acute illness. My colleague hospitalist discussed with pulmonology-Dr. Hebert Soho will arrange follow-up in the next 1-2 weeks with his office, and then a subsequent biopsy.  Epic message with patient demographics sent to him as well.  Enlarged/heterogeneous thyroid gland: Seen incidentally on CT chest-we will need outpatient thyroid ultrasound.  History of severe persistent asthma: Stable-continue Symbicort-on as needed albuterol infusion.  Follows with allergy/immunology and is on on benralizumab infusion every 8 weeks. Resume montelukast. She takes as needed nightly.   ABG:    Component Value Date/Time   TCO2 25 08/22/2016 1818    Vent Settings: N/A    Condition -Stable  Family Communication  :  Daughter-[phone] updated over the phone on 1/6  Code Status :  Full Code  Diet :  Diet Order            DIET SOFT Room service appropriate? Yes; Fluid consistency: Thin  Diet effective now                  Disposition Plan  :   Status is: Inpatient  Remains inpatient appropriate because:Inpatient level of care appropriate due to severity of illness   Dispo: The patient is from: Home              Anticipated d/c is to: Home              Anticipated d/c date is: > 3 days              Patient currently is not medically stable to d/c.  Barriers to discharge: SBO-s/p laparoscopic lysis of adhesions-awaiting return of bowel function/advancing diet.  Antimicorbials  :    Anti-infectives (From admission, onward)   Start     Dose/Rate Route Frequency  Ordered Stop   05/23/20 1300  ceFAZolin (ANCEF) IVPB 2g/100 mL premix        2 g 200 mL/hr over 30 Minutes Intravenous On call to O.R. 05/23/20 1107 05/23/20 1448      Inpatient Medications  Scheduled Meds: . amLODipine  10 mg Oral Daily  . Chlorhexidine Gluconate Cloth  6 each Topical Daily  . feeding supplement  237 mL Oral BID BM  . guaiFENesin-dextromethorphan  5 mL Oral Once  . insulin aspart  0-9 Units Subcutaneous Q6H  . losartan  100 mg Oral Daily  . mometasone-formoterol  2 puff Inhalation BID  . pantoprazole (PROTONIX) IV  40 mg Intravenous Q24H   Continuous Infusions: . methocarbamol (ROBAXIN) IV 500 mg (05/26/20 2305)  . TPN ADULT (ION) 70 mL/hr at 05/27/20 1821  . TPN ADULT (ION)     PRN Meds:.albuterol, EPINEPHrine, fentaNYL (SUBLIMAZE) injection, haloperidol lactate, labetalol, methocarbamol (ROBAXIN) IV, montelukast, ondansetron **OR** ondansetron (ZOFRAN) IV, promethazine, simethicone, sodium chloride flush   Time Spent in minutes 30  See all Orders from today for further details   Darliss Cheney M.D on 05/28/2020 at 12:30 PM  To page go to [email or web address] - use universal password  [facility] -  Office  [phone]    Objective:   Vitals:   05/27/20 1545 05/27/20 2008 05/27/20 2017 05/28/20 0413  BP: (!) 162/85 (!) 156/89  (!) 111/56  Pulse: 87 85  82  Resp: 18 18  18   Temp: 97.7 F (36.5 C) 98 F (36.7 C)  97.9 F (36.6 C)  TempSrc: Oral Oral  Oral  SpO2: 100% 100% 99% 100%  Weight:      Height:        Wt Readings from Last 3 Encounters:  05/19/20 79.6 kg  05/17/20 81.6 kg  05/03/20 82.6 kg     Intake/Output Summary (Last 24 hours) at 05/28/2020 1230 Last  data filed at 05/28/2020 1000 Gross per 24 hour  Intake 1162.5 ml  Output 1400 ml  Net -237.5 ml     Physical Exam General exam: Appears calm and comfortable  Respiratory system: Clear to auscultation. Respiratory effort normal. Cardiovascular system: S1 & S2 heard, RRR. No  JVD, murmurs, rubs, gallops or clicks. No pedal edema. Gastrointestinal system: Abdomen is nondistended, soft and nontender. No organomegaly or masses felt. Normal bowel sounds heard. Central nervous system: Alert and oriented. No focal neurological deficits. Extremities: Symmetric 5 x 5 power. Skin: No rashes, lesions or ulcers.  Psychiatry: Judgement and insight appear normal. Mood & affect appropriate.     Data Review:    CBC Recent Labs  Lab 05/22/20 1800 05/23/20 0404 05/25/20 0348 05/26/20 0500  WBC 6.8 5.4 12.1* 10.9*  HGB 13.3 13.3 12.2 11.0*  HCT 41.6 41.5 38.0 32.9*  PLT 237 245 247 230  MCV 92.4 93.0 92.5 91.1  MCH 29.6 29.8 29.7 30.5  MCHC 32.0 32.0 32.1 33.4  RDW 13.4 13.4 13.6 13.4  LYMPHSABS 1.4 1.4  --   --   MONOABS 0.9 0.8  --   --   EOSABS 0.0 0.0  --   --   BASOSABS 0.0 0.0  --   --     Chemistries  Recent Labs  Lab 05/22/20 1750 05/23/20 0404 05/24/20 0417 05/25/20 0348 05/26/20 0500 05/27/20 0424  NA 144 144 142 138 135 135  K 3.3* 3.3* 3.6 4.0 4.3 4.5  CL 111 110 111 109 103 101  CO2 22 24 23 23 24 26   GLUCOSE 101* 163* 191* 91 172* 146*  BUN 19 21 22 21 15 14   CREATININE 0.79 0.75 0.76 0.66 0.65 0.73  CALCIUM 8.9 8.9 8.5* 8.2* 8.3* 8.6*  MG 2.1 2.1 1.9 2.0 1.7 2.0  AST 18 18  --   --  18  --   ALT 13 14  --   --  17  --   ALKPHOS 64 59  --   --  60  --   BILITOT 0.9 0.8  --   --  0.6  --    ------------------------------------------------------------------------------------------------------------------ No results for input(s): CHOL, HDL, LDLCALC, TRIG, CHOLHDL, LDLDIRECT in the last 72 hours.  Lab Results  Component Value Date   HGBA1C 5.8 05/03/2020   ------------------------------------------------------------------------------------------------------------------ No results for input(s): TSH, T4TOTAL, T3FREE, THYROIDAB in the last 72 hours.  Invalid input(s):  FREET3 ------------------------------------------------------------------------------------------------------------------ No results for input(s): VITAMINB12, FOLATE, FERRITIN, TIBC, IRON, RETICCTPCT in the last 72 hours.  Coagulation profile No results for input(s): INR, PROTIME in the last 168 hours.  No results for input(s): DDIMER in the last 72 hours.  Cardiac Enzymes No results for input(s): CKMB, TROPONINI, MYOGLOBIN in the last 168 hours.  Invalid input(s): CK ------------------------------------------------------------------------------------------------------------------ No results found for: BNP  Micro Results No results found for this or any previous visit (from the past 240 hour(s)).  Radiology Reports DG Abd 1 View  Result Date: 05/21/2020 CLINICAL DATA:  80 year old female with small bowel obstruction. EXAM: ABDOMEN - 1 VIEW COMPARISON:  CT Abdomen and Pelvis 05/17/2020. Postcontrast abdominal radiographs 05/19/2020. FINDINGS: Portable AP supine views since 05/19/2020 the oral contrast has become dilute throughout the abdomen and pelvis. Still, faint contrast is visible within a dilated left abdominal loop seen previously (arrow). And there is no convincing large bowel contrast. Stable enteric tube visible in the epigastrium. Stable abdominal and pelvic visceral contours. Stable visualized osseous structures. At 1313 hours. IMPRESSION:  1. Persistent small bowel obstruction, with no evidence of improvement since 05/19/2020. 2. Stable enteric tube. Electronically Signed   By: Genevie Ann M.D.   On: 05/21/2020 13:31   CT CHEST W CONTRAST  Result Date: 05/17/2020 CLINICAL DATA:  May 17, 2020, abdominal CT. FINDINGS: Cardiovascular: Atheromatous plaque in the thoracic aorta. No aneurysmal dilation. Noncalcified plaque as well. Normal heart size. Three-vessel coronary artery disease. Central pulmonary vasculature unremarkable on venous phase assessment. Mediastinum/Nodes:  Heterogeneous and enlarged thyroid gland, multinodular changes. No axillary lymphadenopathy. Enlarged peribronchial lymph node on image 69 of series 3 a 13 mm. Borderline enlarged LEFT infrahilar lymph node at 9 mm on image 71 of series 3. Subcarinal lymph node at 14 mm on image 61 of series 3. No additional mediastinal or hilar nodal enlargement. Esophagus mildly patulous. Lungs/Pleura: Pulmonary mass measuring 3.5 x 2.8 cm on image 94 of series 4 with spiculated margins contacting the visceral pleura in the LEFT lower lobe, situated in the medial aspect of the LEFT lower lobe. Direct extension of the mass versus is infra hilar lymph node on image 88 of series 4 measuring 13 mm. Bronchial narrowing of secondary bronchial structures at the level of the mass beneath the LEFT hilum. Airways are otherwise patent. Tiny nodule in the RIGHT chest on image 75 series 4 approximately 2-3 mm. Upper Abdomen: Incidental imaging of upper abdominal contents again with small amount of perisplenic fluid and marked small bowel dilation. Visible adrenal glands are normal. There is excreted contrast within the kidneys partially visualized. Musculoskeletal: No acute musculoskeletal finding. No destructive bone lesion. Spinal degenerative changes. IMPRESSION: 1. LEFT lower lobe mass with LEFT hilar and mediastinal adenopathy most suggestive of bronchogenic carcinoma. Referral to multi disciplinary thoracic oncologic setting with PET or biopsy for further assessment is suggested. 2. Tiny 2-3 mm nodule in the RIGHT chest on image 75 series 4. Attention on follow-up. 3. Signs of bowel obstruction and fluid in the upper abdomen about the spleen partially imaged. 4. Enlarged and heterogeneous thyroid. Recommend thyroid ultrasound (ref: J Am Coll Radiol. 2015 Feb;12(2): 143-50). 5. Three-vessel coronary artery disease. 6. Aortic atherosclerosis Aortic Atherosclerosis (ICD10-I70.0). Electronically Signed   By: Zetta Bills M.D.   On:  05/17/2020 21:21   CT ABDOMEN PELVIS W CONTRAST  Result Date: 05/17/2020 CLINICAL DATA:  Abdominal pain. Vomiting. Possible bowel obstruction on radiograph. EXAM: CT ABDOMEN AND PELVIS WITH CONTRAST TECHNIQUE: Multidetector CT imaging of the abdomen and pelvis was performed using the standard protocol following bolus administration of intravenous contrast. CONTRAST:  130mL OMNIPAQUE IOHEXOL 300 MG/ML  SOLN COMPARISON:  Radiographs earlier today.  CT 02/04/2017 FINDINGS: Lower chest: Lobulated 2.5 cm left lower lobe pulmonary nodule with irregular margins, increased in size from prior exam. No pleural fluid or acute airspace disease. Hepatobiliary: Mild hepatic steatosis. No focal hepatic lesion. Gallbladder physiologically distended, no calcified stone. No biliary dilatation. Pancreas: Parenchymal atrophy. No ductal dilatation or inflammation. Spleen: Normal in size without focal abnormality. Adrenals/Urinary Tract: Normal adrenal glands. No hydronephrosis or perinephric edema. Homogeneous renal enhancement with symmetric excretion on delayed phase imaging. Tiny hypodensity in the upper left kidney is too small to accurately characterize. Urinary bladder is physiologically distended without wall thickening. Slight cystocele. Stomach/Bowel: Fluid-filled distended gastric cardia. Dilated fluid-filled proximal small bowel. Transition from dilated to nondilated in the mid central abdomen, series 4, image 51. More distal small bowel is decompressed. Mild mesenteric edema and small free fluid. No pneumatosis. The appendix is not confidently visualized on the  current exam. Majority of the colon is decompressed. Occasional descending and sigmoid diverticula without diverticulitis. There is pelvic floor descent with small-bowel loops coursing posterior to the bladder. Vascular/Lymphatic: No acute vascular findings are evidence of vascular compromise. Aortic atherosclerosis. Patent portal vein. Patent mesenteric vessels.  No enlarged lymph nodes in the abdomen or pelvis. Reproductive: Status post hysterectomy. No adnexal masses. Other: Small volume of non organized free fluid in both upper quadrants, scattered throughout the mesentery and in the pelvis. No free air or perforation. No focal fluid collection/abscess. Small fat containing upper ventral abdominal wall hernia. Musculoskeletal: Diffuse lumbar degenerative change. No acute osseous abnormality or focal osseous lesion. Scattered subcutaneous granuloma in both gluteal regions/flanks. IMPRESSION: 1. Small bowel obstruction with transition point in the mid central abdomen, likely due to adhesions. Small volume of non organized free fluid in the abdomen and pelvis. No perforation or evidence of ischemia. 2. Lobulated 2.5 cm left lower lobe pulmonary nodule with irregular margins, increased in size from prior exam. This is suspicious for primary bronchogenic malignancy. Recommend dedicated chest CT (with IV contrast) for complete evaluation of the thorax. 3. Mild hepatic steatosis. 4. Pelvic floor descent with small-bowel loops coursing posterior to the bladder. Aortic Atherosclerosis (ICD10-I70.0). Electronically Signed   By: Keith Rake M.D.   On: 05/17/2020 19:40   DG ABD ACUTE 2+V W 1V CHEST  Result Date: 05/17/2020 CLINICAL DATA:  Chronic upper abdominal pain EXAM: DG ABDOMEN ACUTE WITH 1 VIEW CHEST COMPARISON:  08/15/2017 FINDINGS: Supine and upright frontal views of the abdomen and pelvis as well as an upright frontal view of the chest are obtained. Cardiac silhouette is stable. No airspace disease, effusion, or pneumothorax. There is a dilated loop of distended gas-filled small bowel within the left upper quadrant, measuring up to 5.2 cm in diameter. Gas fluid level seen within the left upper quadrant. There is a paucity of distal small bowel gas and colonic gas. No free gas in the greater peritoneal sac. No masses or abnormal calcifications. IMPRESSION: 1.  Findings compatible with high-grade small bowel obstruction. 2. No acute intrathoracic process. Electronically Signed   By: Randa Ngo M.D.   On: 05/17/2020 16:24   DG Abd Portable 1V  Result Date: 05/23/2020 CLINICAL DATA:  SBO EXAM: PORTABLE ABDOMEN - 1 VIEW COMPARISON:  05/22/2020 and prior. FINDINGS: Dilated right hemiabdomen small bowel loop measuring up to 5.3 cm. No evidence of pneumoperitoneum. Multilevel spondylosis. Bilateral body wall calcified granulomas. IMPRESSION: Dilated right hemiabdomen small bowel loop measuring up to 5.3 cm. Electronically Signed   By: Primitivo Gauze M.D.   On: 05/23/2020 08:43   DG Abd Portable 1V  Result Date: 05/22/2020 CLINICAL DATA:  Vomiting, abdominal pain EXAM: PORTABLE ABDOMEN - 1 VIEW COMPARISON:  05/21/2020 FINDINGS: NG tube remains in the stomach. Decreasing bowel distension. No organomegaly or free air. Small left pleural effusion noted with left base atelectasis. IMPRESSION: Improving bowel gas pattern with decreasing bowel distention since prior study. NG tube remains in the stomach. Electronically Signed   By: Rolm Baptise M.D.   On: 05/22/2020 11:33   DG Abd Portable 1V-Small Bowel Obstruction Protocol-initial, 8 hr delay  Result Date: 05/19/2020 CLINICAL DATA:  Small-bowel obstruction EXAM: PORTABLE ABDOMEN - 1 VIEW COMPARISON:  05/18/2020 FINDINGS: Two supine frontal views of the abdomen and pelvis are obtained. Enteric catheter tip and side port project over the gastric fundus. There is excreted contrast within the bladder related to previous CT. Oral contrast is seen within the  stomach and duodenal sweep. Dilute contrast is seen filling dilated loops of small bowel within the left mid abdomen. No evidence of distal small bowel or colonic contrast. IMPRESSION: 1. No evidence of colonic contrast. Persistent dilated small bowel consistent with small-bowel obstruction. Electronically Signed   By: Randa Ngo M.D.   On: 05/19/2020 20:18    DG Abd Portable 1V-Small Bowel Obstruction Protocol-initial, 8 hr delay  Result Date: 05/18/2020 CLINICAL DATA:  Small bowel obstruction EXAM: PORTABLE ABDOMEN - 1 VIEW COMPARISON:  05/17/2020 FINDINGS: Enteric tube has been placed with tip in the left upper quadrant. The proximal side hole projects over the EG junction region, possibly at or just below the EG junction. Advancement is suggested. Persistent gaseous distention of left upper quadrant bowel, similar to prior study. Residual contrast material in the bladder. IMPRESSION: Enteric tube tip is in the left upper quadrant with proximal side hole at or just below the EG junction. Advancement is suggested. Electronically Signed   By: Lucienne Capers M.D.   On: 05/18/2020 17:22   DG Abd Portable 1 View  Result Date: 05/17/2020 CLINICAL DATA:  Nasogastric tube placement EXAM: PORTABLE ABDOMEN - 1 VIEW COMPARISON:  05/17/2020 FINDINGS: Nasogastric tube extends into the upper abdomen with its tip overlying the expected proximal to mid body of the stomach. Visualized lung bases are clear. Dilated loops of gas-filled small bowel are seen within the left upper quadrant suggestive of underlying small-bowel obstruction, however, the mid and inferior abdomen are excluded from view. No gross free intraperitoneal gas within the visualized abdomen. IMPRESSION: Nasogastric tube extends into the proximal to mid body of the stomach. Electronically Signed   By: Fidela Salisbury MD   On: 05/17/2020 23:03   [place] EKG SITE RITE  Result Date: 05/22/2020 If Site Rite image not attached, placement could not be confirmed due to current cardiac rhythm.

## 2020-05-28 NOTE — Progress Notes (Signed)
5 Days Post-Op    CC: Abdominal pain  Subjective:  She states she is passing flatus and may have had a bowel movement yesterday but she is not sure.   Objective: Vital signs in last 24 hours: Temp:  [97.7 F (36.5 C)-98 F (36.7 C)] 97.9 F (36.6 C) (01/08 0413) Pulse Rate:  [82-87] 82 (01/08 0413) Resp:  [18] 18 (01/08 0413) BP: (111-162)/(56-89) 111/56 (01/08 0413) SpO2:  [99 %-100 %] 100 % (01/08 0413) Last BM Date: 05/25/20  Intake/Output from previous day: 01/07 0701 - 01/08 0700 In: 1162.5 [P.O.:75; I.V.:1087.5] Out: 600 [Urine:600] Intake/Output this shift: Total I/O In: -  Out: 400 [Urine:400]  General appearance: alert, cooperative, no distress and Still seems a bit confused does not have much memory of yesterday's events. Resp: nonlabored GI: Soft, sore, incisions c/d/i.   Lab Results:  Recent Labs    05/26/20 0500  WBC 10.9*  HGB 11.0*  HCT 32.9*  PLT 230    BMET Recent Labs    05/26/20 0500 05/27/20 0424  NA 135 135  K 4.3 4.5  CL 103 101  CO2 24 26  GLUCOSE 172* 146*  BUN 15 14  CREATININE 0.65 0.73  CALCIUM 8.3* 8.6*   PT/INR No results for input(s): LABPROT, INR in the last 72 hours.  Recent Labs  Lab 05/22/20 1750 05/23/20 0404 05/26/20 0500  AST 18 18 18   ALT 13 14 17   ALKPHOS 64 59 60  BILITOT 0.9 0.8 0.6  PROT 6.3* 6.2* 5.6*  ALBUMIN 2.9* 2.8* 2.4*     Lipase     Component Value Date/Time   LIPASE 24 05/17/2020 1756     Medications: . amLODipine  10 mg Oral Daily  . Chlorhexidine Gluconate Cloth  6 each Topical Daily  . feeding supplement  237 mL Oral BID BM  . guaiFENesin-dextromethorphan  5 mL Oral Once  . insulin aspart  0-9 Units Subcutaneous Q6H  . losartan  100 mg Oral Daily  . mometasone-formoterol  2 puff Inhalation BID  . pantoprazole (PROTONIX) IV  40 mg Intravenous Q24H    Assessment/Plan HTN HLD GERD Asthma LLL lung mass - noted on CT, concerning for primary bronchogenic malignancy Enlarged  thyroid COVDPositive- spoke with infection prevention, if patient remains asymptomatic vs mild symptoms she can come off precautions 05/27/20  Now on regular floor 05/27/20 Malnourished/deconditioned- prealbumin 15, started TPN  SBO S/pdiagnostic laparoscopic, open lysis of adhesions, enterrorhaphy1/4/22 Dr. Kieth Brightly - POD#4 - Bowel function returning, will trial clear liquids today. Continue TPN until able to reliably tolerate diet. - mobilize, PT/OT  ID -none FEN - soft diet, TPN VTE -SCDs, lovenox Foley -none Follow up -staple removal, Dr. Kieth Brightly   Plan: Going to advance her to soft diet.  Continue TPN until she is taking adequate p.o. diet.  Increase time out of bed, bedside commode, increase ambulation.    LOS: 11 days    Angelica [name] 1/0/0712 Please see Amion

## 2020-05-29 DIAGNOSIS — K56609 Unspecified intestinal obstruction, unspecified as to partial versus complete obstruction: Secondary | ICD-10-CM | POA: Diagnosis not present

## 2020-05-29 LAB — BASIC METABOLIC PANEL
Anion gap: 9 (ref 5–15)
BUN: 18 mg/dL (ref 8–23)
CO2: 23 mmol/L (ref 22–32)
Calcium: 8.3 mg/dL — ABNORMAL LOW (ref 8.9–10.3)
Chloride: 102 mmol/L (ref 98–111)
Creatinine, Ser: 0.74 mg/dL (ref 0.44–1.00)
GFR, Estimated: >60 mL/min (ref >60)
Glucose, Bld: 109 mg/dL — ABNORMAL HIGH (ref 70–99)
Potassium: 4.5 mmol/L (ref 3.5–5.1)
Sodium: 134 mmol/L — ABNORMAL LOW (ref 135–145)

## 2020-05-29 LAB — GLUCOSE, CAPILLARY
Glucose-Capillary: 106 mg/dL — ABNORMAL HIGH (ref 70–99)
Glucose-Capillary: 119 mg/dL — ABNORMAL HIGH (ref 70–99)
Glucose-Capillary: 131 mg/dL — ABNORMAL HIGH (ref 70–99)

## 2020-05-29 MED ORDER — TRACE MINERALS CU-MN-SE-ZN 300-55-60-3000 MCG/ML IV SOLN
INTRAVENOUS | Status: AC
  Filled 2020-05-29: qty 285.6

## 2020-05-29 MED ORDER — ENSURE ENLIVE PO LIQD: 237.0000 mL | Freq: Two times a day (BID) | ORAL | Status: DC

## 2020-05-29 MED ORDER — ACETAMINOPHEN 325 MG PO TABS
650.0000 mg | ORAL_TABLET | Freq: Four times a day (QID) | ORAL | Status: DC | PRN
  Administered 2020-05-29 – 2020-06-03 (×6): 650 mg via ORAL
  Filled 2020-05-29 (×6): qty 2

## 2020-05-29 NOTE — Progress Notes (Signed)
PHARMACY - TOTAL PARENTERAL NUTRITION CONSULT NOTE  Indication: Small bowel obstruction  Patient Measurements: Height: 5\' 5"  (165.1 cm) Weight: 79.6 kg (175 lb 7.8 oz) IBW/kg (Calculated) : 57   Body mass index is 29.2 kg/m.  Assessment:  85 YOF who presented on 12/28 with abd pain, N/V and was found via KUB and CT showed SBO thought to be due to adhesions. The patient previously had a SBO back in 2016, which required surgery to correct. D#6 of NPO, NGT placed 12/28 for decompression, bowel rest, pre-albumin low at 15.6 - pharmacy consulted for TPN management.  Glucose / Insulin: no hx DM - CBGs acceptable, used 4 units SSI in last 24hrs Electrolytes: Na 134, others stable WNL Renal: SCr < 1, BUN WNL LFTs / TGs: LFTs / Tbili / TG WNL Prealbumin / albumin: pre-albumin 15.6, albumin 2.4 Intake / Output; MIVF: UOP 0.4 ml/kg/hr per documentation, pulled out NGT 1/3 d/t agitation, total I/O's inaccurate, LBM 1/5 GI Imaging: 12/28 Abd CT: SBO likely d/t adhesions, no perf or evidence of ischemia 1/1 Abd X-ray: Persistent SBO w/ no evidence of improvement 1/2 Abd, x-ray - Improving bowel gas pattern with decreasing bowel distention since prior study 1/3 abd XR - dilated hemiabdomen small bowel loop Surgeries / Procedures: 1/3 ex-lap with open LoA and small bowel repair  Central access: PICC placed 05/22/20 TPN start date: 05/22/20  Nutritional Goals (per RD rec on 1/6): kCal: 1800-1950, Protein: 85-95g, Fluid: >/=1.8L/day  Current Nutrition:  TPN Advancing to soft diet 1/8 - eating small amounts of food (only "2 bites" of breakfast 1/9) Ensure Enlive PO BID - all doses charted yesterday  Plan:  Continue 2-in-1 TPN with Tues/Fri Intralipids due to allergy and to minimize waste during shortage Continue with Clinisol due to Travasol shortage  Continue TPN at 1/2 rate today at 1800 per Dr. Marcello Moores and d/c when reliably tolerating PO. TPN to continue at 35 ml/hr today and Intralipids at 20  ml/hr x 12 hrs on Tues and Fri Electrolytes in TPN: continue same today - Na 78mEq/L, K 60mEq/L, Ca 22mEq/L, Mag 32mEq/L, Phos 19mmol/L, Cl:Ac 1:1 Add standard MVI and trace elements to TPN Continue sensitive SSI Q6H and adjust as needed Monitor TPN labs F/U with PO intake/diet advancement to d/c TPN   Arturo Morton, PharmD, BCPS Please check AMION for all [place] contact numbers Clinical Pharmacist 05/29/2020 9:06 AM

## 2020-05-29 NOTE — Progress Notes (Signed)
6 Days Post-Op    CC: Abdominal pain  Subjective:  She states she is passing flatus but doesn't think she had a BM yesterday.  Eating small amounts of food Objective: Vital signs in last 24 hours: Temp:  [97.7 F (36.5 C)-98.4 F (36.9 C)] 97.7 F (36.5 C) (01/09 0410) Pulse Rate:  [74-84] 74 (01/09 0410) Resp:  [19-21] 20 (01/09 0410) BP: (122-130)/(65-70) 123/68 (01/09 0410) SpO2:  [98 %-100 %] 100 % (01/09 0410) Last BM Date: 05/25/20  Intake/Output from previous day: 01/08 0701 - 01/09 0700 In: 917.9 [P.O.:460; I.V.:457.9] Out: 800 [Urine:800] Intake/Output this shift: No intake/output data recorded.  General appearance: alert, cooperative, no distress  Resp: nonlabored GI: Soft, sore, incisions c/d/i.   Lab Results:  No results for input(s): WBC, HGB, HCT, PLT in the last 72 hours.  BMET Recent Labs    05/27/20 0424 05/29/20 0055  NA 135 134*  K 4.5 4.5  CL 101 102  CO2 26 23  GLUCOSE 146* 109*  BUN 14 18  CREATININE 0.73 0.74  CALCIUM 8.6* 8.3*   PT/INR No results for input(s): LABPROT, INR in the last 72 hours.  Recent Labs  Lab 05/22/20 1750 05/23/20 0404 05/26/20 0500  AST 18 18 18   ALT 13 14 17   ALKPHOS 64 59 60  BILITOT 0.9 0.8 0.6  PROT 6.3* 6.2* 5.6*  ALBUMIN 2.9* 2.8* 2.4*     Lipase     Component Value Date/Time   LIPASE 24 05/17/2020 1756     Medications: . amLODipine  10 mg Oral Daily  . Chlorhexidine Gluconate Cloth  6 each Topical Daily  . feeding supplement  237 mL Oral BID BM  . guaiFENesin-dextromethorphan  5 mL Oral Once  . insulin aspart  0-9 Units Subcutaneous Q6H  . losartan  100 mg Oral Daily  . mometasone-formoterol  2 puff Inhalation BID  . 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 Kieth Brightly - POD#5 - Bowel function returning, cont soft diet, add ensure. Continue 1/2 rate TPN until able to reliably tolerate diet. - mobilize, PT/OT  ID -none FEN - soft diet, TPN VTE -SCDs, lovenox Foley -none Follow up -staple removal, Dr. Kieth Brightly   Plan: Encourage PO intake, add ensure.  increase ambulation.    LOS: 12 days    Angelica [name] 06/27/784 Please see Amion

## 2020-05-29 NOTE — Progress Notes (Signed)
PROGRESS NOTE                                                                                                                                                                                                             Patient Demographics:    Angelica [name], is a 80 y.o. female, DOB - 1940-08-27, NOM:[id number]  Outpatient Primary MD for the patient is [name], MD   Admit date - 05/17/2020   LOS - 12  Chief Complaint  Patient presents with  . Abdominal Pain       Brief Narrative: Patient is a 80 y.o. female with PMHx of HTN, severe persistent asthma (on benralizumab infusion every 8 weeks)-prior history of SBO-who presented to the hospital with abdominal pain, nausea/vomiting-she was found to have SBO and incidental COVID infection.  She was subsequently admitted to the hospitalist service.  COVID-19 vaccinated status: Fully vaccinated including booster.  Significant Events: 12/28>> Admit to [facility] for SBO  Significant studies: 12/28>> CT abdomen/pelvis: SBO with transition point in the mid central abdomen 12/28>> CT chest: Left lower lobe mass with left hilar/mediastinal adenopathy, enlarged/heterogeneous thyroid  COVID-19 medications: Monoclonal antibody: 12/29 x 1  Antibiotics: None  Microbiology data: None  Procedures: 1/3>> diagnostic laparoscopy-open lysis of adhesions   Consults: General surgery  DVT prophylaxis:     Subjective:   Seen and examined.  Some abdominal discomfort now that she had a large bowel movement.  Otherwise tolerating soft diet.  No other complaint.   Assessment  & Plan :   SBO: Secondary to lesions-s/p laparoscopy and lysis of adhesions-General surgery following and directing care.  She is feeling better.  She is tolerating soft diet.  General surgery has kept her on soft diet again today.  I have sent them a message to see if she can be advanced to regular diet.   Waiting for response.  Continues to be on TPN.  COVID-19 infection: Asymptomatic-s/p MAB infusion-apart from close monitoring-no other therapies indicated at this point.  Last day of isolation on 1/6-following which she no longer requires any further isolation.  HTN: Blood pressure better controlled.  Continue home dose of amlodipine and losartan.  GERD: On PPI  Left lung mass: Seen incidentally on imaging studies-will need pulmonary evaluation-bronchoscopy/biopsy-once she completely recovers from her acute illness. My colleague hospitalist discussed with pulmonology-Dr. Hebert Soho  will arrange follow-up in the next 1-2 weeks with his office, and then a subsequent biopsy.  Epic message with patient demographics sent to him as well.  Patient's daughter had prohibited hospitalist from breaking the news as she wanted to do it herself over the weekend.  I discussed this topic with her today and she has yet not discussed that with her mother and she is requesting that we break the news when she is closer to discharge.  Enlarged/heterogeneous thyroid gland: Seen incidentally on CT chest-we will need outpatient thyroid ultrasound.  History of severe persistent asthma: Stable-continue Symbicort-on as needed albuterol infusion.  Follows with allergy/immunology and is on on benralizumab infusion every 8 weeks. Resume montelukast. She takes as needed nightly.   ABG:    Component Value Date/Time   TCO2 25 08/22/2016 1818    Vent Settings: N/A    Condition -Stable  Family Communication  :  [name] updated over the phone today.  Code Status :  Full Code  Diet :  Diet Order            DIET SOFT Room service appropriate? Yes; Fluid consistency: Thin  Diet effective now                  Disposition Plan  :   Status is: Inpatient  Remains inpatient appropriate because:Inpatient level of care appropriate due to severity of illness   Dispo: The patient is from: Home               Anticipated d/c is to: Home with home health              Anticipated d/c date is: 1 to 2 days              Patient currently is not medically stable to d/c.  Barriers to discharge: SBO-s/p laparoscopic lysis of adhesions-awaiting return of bowel function/advancing diet.  Antimicorbials  :    Anti-infectives (From admission, onward)   Start     Dose/Rate Route Frequency Ordered Stop   05/23/20 1300  ceFAZolin (ANCEF) IVPB 2g/100 mL premix        2 g 200 mL/hr over 30 Minutes Intravenous On call to O.R. 05/23/20 1107 05/23/20 1448      Inpatient Medications  Scheduled Meds: . amLODipine  10 mg Oral Daily  . Chlorhexidine Gluconate Cloth  6 each Topical Daily  . feeding supplement  237 mL Oral BID BM  . guaiFENesin-dextromethorphan  5 mL Oral Once  . insulin aspart  0-9 Units Subcutaneous Q6H  . losartan  100 mg Oral Daily  . mometasone-formoterol  2 puff Inhalation BID  . pantoprazole (PROTONIX) IV  40 mg Intravenous Q24H   Continuous Infusions: . methocarbamol (ROBAXIN) IV 500 mg (05/26/20 2305)  . TPN ADULT (ION) 35 mL/hr at 05/28/20 1733  . TPN ADULT (ION)     PRN Meds:.acetaminophen, albuterol, EPINEPHrine, fentaNYL (SUBLIMAZE) injection, haloperidol lactate, labetalol, methocarbamol (ROBAXIN) IV, montelukast, ondansetron **OR** ondansetron (ZOFRAN) IV, promethazine, simethicone, sodium chloride flush   Time Spent in minutes 31  See all Orders from today for further details   Darliss Cheney M.D on 05/29/2020 at 1:10 PM  To page go to [email or web address] - use universal password  [facility] -  Office  [phone]    Objective:   Vitals:   05/28/20 2116 05/28/20 2140 05/29/20 0410 05/29/20 1239  BP:  122/70 123/68 113/75  Pulse:  77 74 74  Resp:  19 20 19   Temp:  98.4 F (36.9 C) 97.7 F (36.5 C) 98.2 F (36.8 C)  TempSrc:  Oral Oral Oral  SpO2: 98% 99% 100% 100%  Weight:      Height:        Wt Readings from Last 3 Encounters:  05/19/20 79.6 kg   05/17/20 81.6 kg  05/03/20 82.6 kg     Intake/Output Summary (Last 24 hours) at 05/29/2020 1310 Last data filed at 05/29/2020 0926 Gross per 24 hour  Intake 677.91 ml  Output --  Net 677.91 ml     Physical Exam  General exam: Appears calm and comfortable  Respiratory system: Clear to auscultation. Respiratory effort normal. Cardiovascular system: S1 & S2 heard, RRR. No JVD, murmurs, rubs, gallops or clicks. No pedal edema. Gastrointestinal system: Abdomen is very mild distention but soft and very mildly tenderness at incision site. No organomegaly or masses felt. Normal bowel sounds heard. Central nervous system: Alert and oriented. No focal neurological deficits. Extremities: Symmetric 5 x 5 power. Skin: No rashes, lesions or ulcers.  Psychiatry: Judgement and insight appear normal. Mood & affect appropriate.     Data Review:    CBC Recent Labs  Lab 05/22/20 1800 05/23/20 0404 05/25/20 0348 05/26/20 0500  WBC 6.8 5.4 12.1* 10.9*  HGB 13.3 13.3 12.2 11.0*  HCT 41.6 41.5 38.0 32.9*  PLT 237 245 247 230  MCV 92.4 93.0 92.5 91.1  MCH 29.6 29.8 29.7 30.5  MCHC 32.0 32.0 32.1 33.4  RDW 13.4 13.4 13.6 13.4  LYMPHSABS 1.4 1.4  --   --   MONOABS 0.9 0.8  --   --   EOSABS 0.0 0.0  --   --   BASOSABS 0.0 0.0  --   --     Chemistries  Recent Labs  Lab 05/22/20 1750 05/23/20 0404 05/24/20 0417 05/25/20 0348 05/26/20 0500 05/27/20 0424 05/29/20 0055  NA 144 144 142 138 135 135 134*  K 3.3* 3.3* 3.6 4.0 4.3 4.5 4.5  CL 111 110 111 109 103 101 102  CO2 22 24 23 23 24 26 23   GLUCOSE 101* 163* 191* 91 172* 146* 109*  BUN 19 21 22 21 15 14 18   CREATININE 0.79 0.75 0.76 0.66 0.65 0.73 0.74  CALCIUM 8.9 8.9 8.5* 8.2* 8.3* 8.6* 8.3*  MG 2.1 2.1 1.9 2.0 1.7 2.0  --   AST 18 18  --   --  18  --   --   ALT 13 14  --   --  17  --   --   ALKPHOS 64 59  --   --  60  --   --   BILITOT 0.9 0.8  --   --  0.6  --   --     ------------------------------------------------------------------------------------------------------------------ No results for input(s): CHOL, HDL, LDLCALC, TRIG, CHOLHDL, LDLDIRECT in the last 72 hours.  Lab Results  Component Value Date   HGBA1C 5.8 05/03/2020   ------------------------------------------------------------------------------------------------------------------ No results for input(s): TSH, T4TOTAL, T3FREE, THYROIDAB in the last 72 hours.  Invalid input(s): FREET3 ------------------------------------------------------------------------------------------------------------------ No results for input(s): VITAMINB12, FOLATE, FERRITIN, TIBC, IRON, RETICCTPCT in the last 72 hours.  Coagulation profile No results for input(s): INR, PROTIME in the last 168 hours.  No results for input(s): DDIMER in the last 72 hours.  Cardiac Enzymes No results for input(s): CKMB, TROPONINI, MYOGLOBIN in the last 168 hours.  Invalid input(s): CK ------------------------------------------------------------------------------------------------------------------ No results found for: BNP  Micro Results No results found for this or any previous  visit (from the past 240 hour(s)).  Radiology Reports DG Abd 1 View  Result Date: 05/21/2020 CLINICAL DATA:  80 year old female with small bowel obstruction. EXAM: ABDOMEN - 1 VIEW COMPARISON:  CT Abdomen and Pelvis 05/17/2020. Postcontrast abdominal radiographs 05/19/2020. FINDINGS: Portable AP supine views since 05/19/2020 the oral contrast has become dilute throughout the abdomen and pelvis. Still, faint contrast is visible within a dilated left abdominal loop seen previously (arrow). And there is no convincing large bowel contrast. Stable enteric tube visible in the epigastrium. Stable abdominal and pelvic visceral contours. Stable visualized osseous structures. At 1313 hours. IMPRESSION: 1. Persistent small bowel obstruction, with no evidence of  improvement since 05/19/2020. 2. Stable enteric tube. Electronically Signed   By: Genevie Ann M.D.   On: 05/21/2020 13:31   CT CHEST W CONTRAST  Result Date: 05/17/2020 CLINICAL DATA:  May 17, 2020, abdominal CT. FINDINGS: Cardiovascular: Atheromatous plaque in the thoracic aorta. No aneurysmal dilation. Noncalcified plaque as well. Normal heart size. Three-vessel coronary artery disease. Central pulmonary vasculature unremarkable on venous phase assessment. Mediastinum/Nodes: Heterogeneous and enlarged thyroid gland, multinodular changes. No axillary lymphadenopathy. Enlarged peribronchial lymph node on image 69 of series 3 a 13 mm. Borderline enlarged LEFT infrahilar lymph node at 9 mm on image 71 of series 3. Subcarinal lymph node at 14 mm on image 61 of series 3. No additional mediastinal or hilar nodal enlargement. Esophagus mildly patulous. Lungs/Pleura: Pulmonary mass measuring 3.5 x 2.8 cm on image 94 of series 4 with spiculated margins contacting the visceral pleura in the LEFT lower lobe, situated in the medial aspect of the LEFT lower lobe. Direct extension of the mass versus is infra hilar lymph node on image 88 of series 4 measuring 13 mm. Bronchial narrowing of secondary bronchial structures at the level of the mass beneath the LEFT hilum. Airways are otherwise patent. Tiny nodule in the RIGHT chest on image 75 series 4 approximately 2-3 mm. Upper Abdomen: Incidental imaging of upper abdominal contents again with small amount of perisplenic fluid and marked small bowel dilation. Visible adrenal glands are normal. There is excreted contrast within the kidneys partially visualized. Musculoskeletal: No acute musculoskeletal finding. No destructive bone lesion. Spinal degenerative changes. IMPRESSION: 1. LEFT lower lobe mass with LEFT hilar and mediastinal adenopathy most suggestive of bronchogenic carcinoma. Referral to multi disciplinary thoracic oncologic setting with PET or biopsy for further  assessment is suggested. 2. Tiny 2-3 mm nodule in the RIGHT chest on image 75 series 4. Attention on follow-up. 3. Signs of bowel obstruction and fluid in the upper abdomen about the spleen partially imaged. 4. Enlarged and heterogeneous thyroid. Recommend thyroid ultrasound (ref: J Am Coll Radiol. 2015 Feb;12(2): 143-50). 5. Three-vessel coronary artery disease. 6. Aortic atherosclerosis Aortic Atherosclerosis (ICD10-I70.0). Electronically Signed   By: Zetta Bills M.D.   On: 05/17/2020 21:21   CT ABDOMEN PELVIS W CONTRAST  Result Date: 05/17/2020 CLINICAL DATA:  Abdominal pain. Vomiting. Possible bowel obstruction on radiograph. EXAM: CT ABDOMEN AND PELVIS WITH CONTRAST TECHNIQUE: Multidetector CT imaging of the abdomen and pelvis was performed using the standard protocol following bolus administration of intravenous contrast. CONTRAST:  136mL OMNIPAQUE IOHEXOL 300 MG/ML  SOLN COMPARISON:  Radiographs earlier today.  CT 02/04/2017 FINDINGS: Lower chest: Lobulated 2.5 cm left lower lobe pulmonary nodule with irregular margins, increased in size from prior exam. No pleural fluid or acute airspace disease. Hepatobiliary: Mild hepatic steatosis. No focal hepatic lesion. Gallbladder physiologically distended, no calcified stone. No biliary dilatation. Pancreas: Parenchymal  atrophy. No ductal dilatation or inflammation. Spleen: Normal in size without focal abnormality. Adrenals/Urinary Tract: Normal adrenal glands. No hydronephrosis or perinephric edema. Homogeneous renal enhancement with symmetric excretion on delayed phase imaging. Tiny hypodensity in the upper left kidney is too small to accurately characterize. Urinary bladder is physiologically distended without wall thickening. Slight cystocele. Stomach/Bowel: Fluid-filled distended gastric cardia. Dilated fluid-filled proximal small bowel. Transition from dilated to nondilated in the mid central abdomen, series 4, image 51. More distal small bowel is  decompressed. Mild mesenteric edema and small free fluid. No pneumatosis. The appendix is not confidently visualized on the current exam. Majority of the colon is decompressed. Occasional descending and sigmoid diverticula without diverticulitis. There is pelvic floor descent with small-bowel loops coursing posterior to the bladder. Vascular/Lymphatic: No acute vascular findings are evidence of vascular compromise. Aortic atherosclerosis. Patent portal vein. Patent mesenteric vessels. No enlarged lymph nodes in the abdomen or pelvis. Reproductive: Status post hysterectomy. No adnexal masses. Other: Small volume of non organized free fluid in both upper quadrants, scattered throughout the mesentery and in the pelvis. No free air or perforation. No focal fluid collection/abscess. Small fat containing upper ventral abdominal wall hernia. Musculoskeletal: Diffuse lumbar degenerative change. No acute osseous abnormality or focal osseous lesion. Scattered subcutaneous granuloma in both gluteal regions/flanks. IMPRESSION: 1. Small bowel obstruction with transition point in the mid central abdomen, likely due to adhesions. Small volume of non organized free fluid in the abdomen and pelvis. No perforation or evidence of ischemia. 2. Lobulated 2.5 cm left lower lobe pulmonary nodule with irregular margins, increased in size from prior exam. This is suspicious for primary bronchogenic malignancy. Recommend dedicated chest CT (with IV contrast) for complete evaluation of the thorax. 3. Mild hepatic steatosis. 4. Pelvic floor descent with small-bowel loops coursing posterior to the bladder. Aortic Atherosclerosis (ICD10-I70.0). Electronically Signed   By: Keith Rake M.D.   On: 05/17/2020 19:40   DG ABD ACUTE 2+V W 1V CHEST  Result Date: 05/17/2020 CLINICAL DATA:  Chronic upper abdominal pain EXAM: DG ABDOMEN ACUTE WITH 1 VIEW CHEST COMPARISON:  08/15/2017 FINDINGS: Supine and upright frontal views of the abdomen and  pelvis as well as an upright frontal view of the chest are obtained. Cardiac silhouette is stable. No airspace disease, effusion, or pneumothorax. There is a dilated loop of distended gas-filled small bowel within the left upper quadrant, measuring up to 5.2 cm in diameter. Gas fluid level seen within the left upper quadrant. There is a paucity of distal small bowel gas and colonic gas. No free gas in the greater peritoneal sac. No masses or abnormal calcifications. IMPRESSION: 1. Findings compatible with high-grade small bowel obstruction. 2. No acute intrathoracic process. Electronically Signed   By: Randa Ngo M.D.   On: 05/17/2020 16:24   DG Abd Portable 1V  Result Date: 05/23/2020 CLINICAL DATA:  SBO EXAM: PORTABLE ABDOMEN - 1 VIEW COMPARISON:  05/22/2020 and prior. FINDINGS: Dilated right hemiabdomen small bowel loop measuring up to 5.3 cm. No evidence of pneumoperitoneum. Multilevel spondylosis. Bilateral body wall calcified granulomas. IMPRESSION: Dilated right hemiabdomen small bowel loop measuring up to 5.3 cm. Electronically Signed   By: Primitivo Gauze M.D.   On: 05/23/2020 08:43   DG Abd Portable 1V  Result Date: 05/22/2020 CLINICAL DATA:  Vomiting, abdominal pain EXAM: PORTABLE ABDOMEN - 1 VIEW COMPARISON:  05/21/2020 FINDINGS: NG tube remains in the stomach. Decreasing bowel distension. No organomegaly or free air. Small left pleural effusion noted with left base  atelectasis. IMPRESSION: Improving bowel gas pattern with decreasing bowel distention since prior study. NG tube remains in the stomach. Electronically Signed   By: Rolm Baptise M.D.   On: 05/22/2020 11:33   DG Abd Portable 1V-Small Bowel Obstruction Protocol-initial, 8 hr delay  Result Date: 05/19/2020 CLINICAL DATA:  Small-bowel obstruction EXAM: PORTABLE ABDOMEN - 1 VIEW COMPARISON:  05/18/2020 FINDINGS: Two supine frontal views of the abdomen and pelvis are obtained. Enteric catheter tip and side port project over the  gastric fundus. There is excreted contrast within the bladder related to previous CT. Oral contrast is seen within the stomach and duodenal sweep. Dilute contrast is seen filling dilated loops of small bowel within the left mid abdomen. No evidence of distal small bowel or colonic contrast. IMPRESSION: 1. No evidence of colonic contrast. Persistent dilated small bowel consistent with small-bowel obstruction. Electronically Signed   By: Randa Ngo M.D.   On: 05/19/2020 20:18   DG Abd Portable 1V-Small Bowel Obstruction Protocol-initial, 8 hr delay  Result Date: 05/18/2020 CLINICAL DATA:  Small bowel obstruction EXAM: PORTABLE ABDOMEN - 1 VIEW COMPARISON:  05/17/2020 FINDINGS: Enteric tube has been placed with tip in the left upper quadrant. The proximal side hole projects over the EG junction region, possibly at or just below the EG junction. Advancement is suggested. Persistent gaseous distention of left upper quadrant bowel, similar to prior study. Residual contrast material in the bladder. IMPRESSION: Enteric tube tip is in the left upper quadrant with proximal side hole at or just below the EG junction. Advancement is suggested. Electronically Signed   By: Lucienne Capers M.D.   On: 05/18/2020 17:22   DG Abd Portable 1 View  Result Date: 05/17/2020 CLINICAL DATA:  Nasogastric tube placement EXAM: PORTABLE ABDOMEN - 1 VIEW COMPARISON:  05/17/2020 FINDINGS: Nasogastric tube extends into the upper abdomen with its tip overlying the expected proximal to mid body of the stomach. Visualized lung bases are clear. Dilated loops of gas-filled small bowel are seen within the left upper quadrant suggestive of underlying small-bowel obstruction, however, the mid and inferior abdomen are excluded from view. No gross free intraperitoneal gas within the visualized abdomen. IMPRESSION: Nasogastric tube extends into the proximal to mid body of the stomach. Electronically Signed   By: Fidela Salisbury MD   On:  05/17/2020 23:03   [place] EKG SITE RITE  Result Date: 05/22/2020 If Site Rite image not attached, placement could not be confirmed due to current cardiac rhythm.

## 2020-05-30 DIAGNOSIS — K56609 Unspecified intestinal obstruction, unspecified as to partial versus complete obstruction: Secondary | ICD-10-CM | POA: Diagnosis not present

## 2020-05-30 LAB — COMPREHENSIVE METABOLIC PANEL
ALT: 245 U/L — ABNORMAL HIGH (ref 0–44)
AST: 140 U/L — ABNORMAL HIGH (ref 15–41)
Albumin: 2.5 g/dL — ABNORMAL LOW (ref 3.5–5.0)
Alkaline Phosphatase: 107 U/L (ref 38–126)
Anion gap: 9 (ref 5–15)
BUN: 14 mg/dL (ref 8–23)
CO2: 24 mmol/L (ref 22–32)
Calcium: 8.6 mg/dL — ABNORMAL LOW (ref 8.9–10.3)
Chloride: 102 mmol/L (ref 98–111)
Creatinine, Ser: 0.77 mg/dL (ref 0.44–1.00)
GFR, Estimated: >60 mL/min (ref >60)
Glucose, Bld: 116 mg/dL — ABNORMAL HIGH (ref 70–99)
Potassium: 4 mmol/L (ref 3.5–5.1)
Sodium: 135 mmol/L (ref 135–145)
Total Bilirubin: 0.6 mg/dL (ref 0.3–1.2)
Total Protein: 6 g/dL — ABNORMAL LOW (ref 6.5–8.1)

## 2020-05-30 LAB — DIFFERENTIAL
Abs Immature Granulocytes: 0.31 10*3/uL — ABNORMAL HIGH (ref 0.00–0.07)
Basophils Absolute: 0 10*3/uL (ref 0.0–0.1)
Basophils Relative: 0 %
Eosinophils Absolute: 0 10*3/uL (ref 0.0–0.5)
Eosinophils Relative: 0 %
Immature Granulocytes: 2 %
Lymphocytes Relative: 19 %
Lymphs Abs: 2.5 10*3/uL (ref 0.7–4.0)
Monocytes Absolute: 1.3 10*3/uL — ABNORMAL HIGH (ref 0.1–1.0)
Monocytes Relative: 10 %
Neutro Abs: 8.7 10*3/uL — ABNORMAL HIGH (ref 1.7–7.7)
Neutrophils Relative %: 69 %

## 2020-05-30 LAB — CBC
HCT: 35 % — ABNORMAL LOW (ref 36.0–46.0)
Hemoglobin: 11.3 g/dL — ABNORMAL LOW (ref 12.0–15.0)
MCH: 29.5 pg (ref 26.0–34.0)
MCHC: 32.3 g/dL (ref 30.0–36.0)
MCV: 91.4 fL (ref 80.0–100.0)
Platelets: 253 10*3/uL (ref 150–400)
RBC: 3.83 MIL/uL — ABNORMAL LOW (ref 3.87–5.11)
RDW: 13.5 % (ref 11.5–15.5)
WBC: 12.8 10*3/uL — ABNORMAL HIGH (ref 4.0–10.5)
nRBC: 0 % (ref 0.0–0.2)

## 2020-05-30 LAB — GLUCOSE, CAPILLARY
Glucose-Capillary: 120 mg/dL — ABNORMAL HIGH (ref 70–99)
Glucose-Capillary: 129 mg/dL — ABNORMAL HIGH (ref 70–99)
Glucose-Capillary: 132 mg/dL — ABNORMAL HIGH (ref 70–99)
Glucose-Capillary: 134 mg/dL — ABNORMAL HIGH (ref 70–99)
Glucose-Capillary: 135 mg/dL — ABNORMAL HIGH (ref 70–99)

## 2020-05-30 LAB — MAGNESIUM: Magnesium: 1.9 mg/dL (ref 1.7–2.4)

## 2020-05-30 LAB — PHOSPHORUS: Phosphorus: 3.8 mg/dL (ref 2.5–4.6)

## 2020-05-30 LAB — TRIGLYCERIDES: Triglycerides: 99 mg/dL (ref <150)

## 2020-05-30 LAB — PREALBUMIN: Prealbumin: 23.9 mg/dL (ref 18–38)

## 2020-05-30 MED ORDER — BOOST / RESOURCE BREEZE PO LIQD CUSTOM
1.0000 | Freq: Three times a day (TID) | ORAL | Status: DC
  Administered 2020-05-30 – 2020-06-01 (×3): 1 via ORAL

## 2020-05-30 MED ORDER — ENSURE ENLIVE PO LIQD
237.0000 mL | Freq: Three times a day (TID) | ORAL | Status: DC
  Administered 2020-05-30: 237 mL via ORAL

## 2020-05-30 MED ORDER — STERILE WATER FOR INJECTION IV SOLN
INTRAVENOUS | Status: AC
  Filled 2020-05-30: qty 285.6

## 2020-05-30 MED ORDER — ADULT MULTIVITAMIN W/MINERALS CH
1.0000 | ORAL_TABLET | Freq: Every day | ORAL | Status: DC
  Administered 2020-05-30 – 2020-06-03 (×5): 1 via ORAL
  Filled 2020-05-30 (×5): qty 1

## 2020-05-30 MED ORDER — PANTOPRAZOLE SODIUM 40 MG PO TBEC
40.0000 mg | DELAYED_RELEASE_TABLET | Freq: Every day | ORAL | Status: DC
  Administered 2020-05-30 – 2020-06-02 (×4): 40 mg via ORAL
  Filled 2020-05-30 (×4): qty 1

## 2020-05-30 NOTE — Progress Notes (Signed)
7 Days Post-Op    CC: Abdominal pain  Subjective: Had a BM yesterday.  Wants to get up OOB.  Eating small amounts.  Doesn't recall getting ensure although it is ordered.   Objective: Vital signs in last 24 hours: Temp:  [98.2 F (36.8 C)-98.4 F (36.9 C)] 98.4 F (36.9 C) (01/10 0524) Pulse Rate:  [74-76] 75 (01/10 0524) Resp:  [16-19] 16 (01/09 2017) BP: (109-125)/(67-75) 125/67 (01/10 0524) SpO2:  [97 %-100 %] 97 % (01/10 0732) Last BM Date: 05/29/20  Intake/Output from previous day: 01/09 0701 - 01/10 0700 In: 300 [P.O.:300] Out: 1000 [Urine:1000] Intake/Output this shift: No intake/output data recorded.  PE: Abd: soft, appropriately tender, incision c/d/i with staples present, +BS, ND  Lab Results:  Recent Labs    05/30/20 0340  WBC 12.8*  HGB 11.3*  HCT 35.0*  PLT 253    BMET Recent Labs    05/29/20 0055 05/30/20 0340  NA 134* 135  K 4.5 4.0  CL 102 102  CO2 23 24  GLUCOSE 109* 116*  BUN 18 14  CREATININE 0.74 0.77  CALCIUM 8.3* 8.6*   PT/INR No results for input(s): LABPROT, INR in the last 72 hours.  Recent Labs  Lab 05/26/20 0500 05/30/20 0340  AST 18 140*  ALT 17 245*  ALKPHOS 60 107  BILITOT 0.6 0.6  PROT 5.6* 6.0*  ALBUMIN 2.4* 2.5*     Lipase     Component Value Date/Time   LIPASE 24 05/17/2020 1756     Medications: . amLODipine  10 mg Oral Daily  . Chlorhexidine Gluconate Cloth  6 each Topical Daily  . feeding supplement  237 mL Oral TID BM  . guaiFENesin-dextromethorphan  5 mL Oral Once  . insulin aspart  0-9 Units Subcutaneous Q6H  . losartan  100 mg Oral Daily  . mometasone-formoterol  2 puff Inhalation BID  . 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  TNA 1/2 rate.  Increase ensure to TID.  Add 48 hrs calorie count to see if we can stop her TNA  POD 7, S/pdiagnostic laparoscopic, open lysis of adhesions, enterrorhaphy1/4/22 Dr. Kieth Brightly for SBO - Bowel function returning, cont soft diet, increase ensure. Continue 1/2 rate TPN until able to reliably tolerate diet.(calorie count to start today) - mobilize, PT/OT -pulm toilet -otherwise surgically stable currently  ID -none FEN - soft diet, TPN(1/2 rate) VTE -SCDs, lovenox Foley -none Follow up -staple removal, Dr. Kieth Brightly     LOS: 13 days    Henreitta Cea 05/30/2020 Please see Amion

## 2020-05-30 NOTE — TOC Progression Note (Signed)
Transition of Care [facility]) - Progression Note    Patient Details  Name: Angelica [name] MRN: [id number] Date of Birth: 11-13-1940  Transition of Care [facility]) CM/SW Contact  Jacalyn Lefevre Edson Snowball, RN Phone Number: 05/30/2020, 1:21 PM  Clinical Narrative:     Received a call from patient's daughter Angelica [name]. She has not received a call from [employer] regarding DME. NCM called Angelica [name], someone at [facility] will call Angelica [name]. Angelica [name] left her cell [phone] she can be reached on cell up until 2 pm today, after 2pm her direct work phone number is [phone].   Angelica [name] aware [facility] accepted referral.  Expected Discharge Plan: [place] Barriers to Discharge: Continued Medical Work up  Expected Discharge Plan and Services Expected Discharge Plan: [place]   Discharge Planning Services: CM Consult Post Acute Care Choice: Home Health,Durable Medical Equipment Living arrangements for the past 2 months: Single Family Home                 DME Arranged: 3-N-1,Wheelchair manual         HH Arranged: PT,OT           Social Determinants of Health (SDOH) Interventions    Readmission Risk Interventions No flowsheet data found.

## 2020-05-30 NOTE — Progress Notes (Addendum)
Physical Therapy Treatment Patient Details Name: Angelica [name] MRN: [id number] DOB: 1940-10-21 Today's Date: 05/30/2020    History of Present Illness Pt adm with SBO. Also found incidental Covid+. Underwent diagnostic laparoscopy, open lysis of adhesions and enterrorhaphy on 05/23/20. PMH - HTN, SBO, thyroid disease, asthma. Now off Covid precs (confirmed per RN with ID 1/8).    PT Comments    Pt supine on arrival, agreeable to therapy session and with good participation and improved tolerance for mobility compared with previous PT session. Pt performed bed mobility with minA/cues for technique, sit<>stand with minA and gait with minA using rollator, with chair follow for safety. Pt denies s/sx orthostatic hypotension and BP stable with transfers this date, other VSS as well. Pt continues to benefit from PT services to progress toward functional mobility goals. D/C recs below remain appropriate per recommendation of supervising PT Alexa S.   Follow Up Recommendations  Home health PT;Supervision/Assistance - 24 hour     Equipment Recommendations  None recommended by PT    Recommendations for Other Services       Precautions / Restrictions Precautions Precautions: Fall Precaution Comments: monitor BP Restrictions Weight Bearing Restrictions: No    Mobility  Bed Mobility Overal bed mobility: Needs Assistance Bed Mobility: Rolling;Sidelying to Sit Rolling: Min guard Sidelying to sit: Min assist     Sit to sidelying: Min guard General bed mobility comments: min guard to roll to pts L side needing tactile cues for hand placement on bed rails, MIN A to elevate trunk into sitting from sidelying.  Transfers Overall transfer level: Needs assistance Equipment used: 4-wheeled walker Transfers: Sit to/from Stand Sit to Stand: Min assist         General transfer comment: Min assist for power up, and steadying. Verbal cuing for hand  placement/safety  Ambulation/Gait Ambulation/Gait assistance: Min guard;+2 safety/equipment (chair follow) Gait Distance (Feet): 100 Feet Assistive device: 4-wheeled walker Gait Pattern/deviations: Step-through pattern;Shuffle;Decreased stride length Gait velocity: decr   General Gait Details: short step-through gait pattern, cues for posture and proximity to 4WW needed at times. chair follow for safety but no LOB and no sxs dizziness reported   Stairs             Wheelchair Mobility    Modified Rankin (Stroke Patients Only)       Balance Overall balance assessment: Needs assistance Sitting-balance support: No upper extremity supported;Feet supported Sitting balance-Leahy Scale: Fair Sitting balance - Comments: able to static sit EOB with no UE suport with no LOB   Standing balance support: Bilateral upper extremity supported;During functional activity Standing balance-Leahy Scale: Poor Standing balance comment: reliant on RW for dynamic tasks but able to don mask with BUE unsupported while static standing no LOB                            Cognition Arousal/Alertness: Awake/alert Behavior During Therapy: WFL for tasks assessed/performed Overall Cognitive Status: Within Functional Limits for tasks assessed Area of Impairment: Following commands;Problem solving                     Memory: Decreased short-term memory Following Commands: Follows one step commands consistently;Follows one step commands with increased time Safety/Judgement: Decreased awareness of deficits   Problem Solving: Slow processing;Requires verbal cues General Comments: improved alertness and fair safety awareness this session, poor carryover of cues for rollator use of brakes pre/post transfers      Exercises  General Comments General comments (skin integrity, edema, etc.): BP supine 106/73; BP sit EOB 123/111 SBP standing 110 (no s/sx dizziness)      Pertinent  Vitals/Pain Pain Assessment: No/denies pain Faces Pain Scale: Hurts a little bit Pain Location: ABD, mostly L side Pain Descriptors / Indicators: Grimacing;Discomfort;Tightness Pain Intervention(s): Monitored during session;Repositioned    Home Living                      Prior Function            PT Goals (current goals can now be found in the care plan section) Acute Rehab PT Goals Patient Stated Goal: go home PT Goal Formulation: With patient Time For Goal Achievement: 06/07/20 Potential to Achieve Goals: Good Progress towards PT goals: Progressing toward goals    Frequency    Min 3X/week      PT Plan Current plan remains appropriate    Co-evaluation              AM-PAC PT "6 Clicks" Mobility   Outcome Measure  Help needed turning from your back to your side while in a flat bed without using bedrails?: A Little Help needed moving from lying on your back to sitting on the side of a flat bed without using bedrails?: A Little Help needed moving to and from a bed to a chair (including a wheelchair)?: A Little Help needed standing up from a chair using your arms (e.g., wheelchair or bedside chair)?: A Little Help needed to walk in hospital room?: A Little Help needed climbing 3-5 steps with a railing? : Total 6 Click Score: 16    End of Session Equipment Utilized During Treatment: Gait belt Activity Tolerance: Patient tolerated treatment well Patient left: in chair;with call bell/phone within reach;with chair alarm set Nurse Communication: Mobility status PT Visit Diagnosis: Other abnormalities of gait and mobility (R26.89);Pain Pain - part of body:  (abdomen)     Time: 0932-3557 PT Time Calculation (min) (ACUTE ONLY): 19 min  Charges:  $Gait Training: 8-22 mins                     Oveta Idris P., PTA Acute Rehabilitation Services Pager: [phone] Office: [place] 05/30/2020, 5:20 PM

## 2020-05-30 NOTE — Progress Notes (Signed)
PHARMACY - TOTAL PARENTERAL NUTRITION CONSULT NOTE  Indication: Small bowel obstruction  Patient Measurements: Height: 5\' 5"  (165.1 cm) Weight: 79.6 kg (175 lb 7.8 oz) IBW/kg (Calculated) : 57   Body mass index is 29.2 kg/m.  Assessment:  64 YOF who presented on 12/28 with abd pain, N/V and was found via KUB and CT showed SBO thought to be due to adhesions. The patient previously had a SBO back in 2016, which required surgery to correct. D#6 of NPO, NGT placed 12/28 for decompression, bowel rest, pre-albumin low at 15.6 - pharmacy consulted for TPN management.  Glucose / Insulin: no hx DM - CBGs better controlled on 1/2 rate TPN, used 1 unit SSI in last 24hrs Electrolytes: all WNL Renal: SCr < 1, BUN WNL LFTs / TGs: AST/ALT up 140/245 (likely d/t lack of bowel stimulation), tbili / TG WNL Prealbumin / albumin: pre-albumin improved to 23.9 WNL, albumin 2.5 Intake / Output; MIVF: UOP 0.5 ml/kg/hr, pulled out NGT 1/3 d/t agitation, LBM 1/9 GI Imaging:  12/28 Abd CT: SBO likely d/t adhesions, no perf or evidence of ischemia 1/1 Abd X-ray: Persistent SBO w/ no evidence of improvement 1/2 Abd, x-ray - Improving bowel gas pattern with decreasing bowel distention since prior study 1/3 abd XR - dilated hemiabdomen small bowel loop Surgeries / Procedures:  1/3 ex-lap with open LoA and small bowel repair  Central access: PICC placed 05/22/20 TPN start date: 05/22/20  Nutritional Goals (per RD rec on 1/6): kCal: 1800-1950, Protein: 85-95g, Fluid: >/=1.8L/day  Current Nutrition:  TPN Soft diet started 1/8 - consume up to 50% of meals Ensure Enlive PO BID - 1 charted given yesterday Calorie count starting 1/10  Plan:  Continue 2-in-1 TPN with Tues/Fri Intralipids due to allergy and to minimize waste during shortage Continue with Clinisol due to Travasol shortage  Continue TPN at 1/2 rate per Surgery (started 1/9) TPN at 35 ml/hr and Intralipids at 20 ml/hr x 12 hrs on Tues and  Fri Electrolytes in TPN: incr Na 134mEq/L, incr K 41mEq/L, Ca 39mEq/L, incr Mag 60mEq/L, Phos 47mmol/L, Cl:Ac 1:1 - increase lytes with reduced TPN rate Remove standard MVI and trace elements from TPN >> add PO multivitamin daily Continue sensitive SSI Q6H Standard TPN labs on Mon/Thurs and PRN F/U with PO intake, calorie count to d/c TPN  Mylinh Cragg D. Mina Marble, PharmD, BCPS, [place] 05/30/2020, 9:17 AM

## 2020-05-30 NOTE — Progress Notes (Signed)
Occupational Therapy Treatment Patient Details Name: Angelica [name] MRN: [id number] DOB: Oct 25, 1940 Today's Date: 05/30/2020    History of present illness Pt adm with SBO. Also found incidental Covid+. Underwent diagnostic laparoscopy, open lysis of adhesions and enterrorhaphy on 05/23/20. PMH - HTN, SBO, thyroid disease, asthma. Now off Covid precs (confirmed per RN with ID 1/8).   OT comments  Pt supine in bed upon arrival but agreeable to OT intervention. Pt reports no s/s of dizziness when getting back in bed from recliner, however once pt EOB pt reports dizziness ( see vitals below). Pt continues to present with generalized weakness, increased pain and decreased activity tolerance impacting pts ability to complete BADLs independently. Pt currently requires up to MIN A for bed mobility, MIN A for UB ADLs and min guard for LB ADLs. Pt seems slow to process this session needing max multimodal cues to problem solve locking brakes on rollator ( see cog section) question whether cog issues are related to meds? Pt would continue to benefit from skilled occupational therapy while admitted and after d/c to address the below listed limitations in order to improve overall functional mobility and facilitate independence with BADL participation. DC plan remains appropriate, will follow acutely per POC.   EOB pt reports feeeling light headed BP 108/63 HR 84 O2 100% BP in standing ( reporting feeling hot and dizzy) 109/87 HR 94 bpm O2 100% BP sitting EOB ( reports feeling woozy) 109/87 HR 86 bpm O2 99     Follow Up Recommendations  Home health OT;Supervision/Assistance - 24 hour    Equipment Recommendations  3 in 1 bedside commode;Wheelchair (measurements OT);Wheelchair cushion (measurements OT)    Recommendations for Other Services      Precautions / Restrictions Precautions Precautions: Fall Precaution Comments: monitor BP Restrictions Weight Bearing Restrictions: No       Mobility Bed  Mobility Overal bed mobility: Needs Assistance Bed Mobility: Rolling;Sidelying to Sit;Sit to Sidelying Rolling: Min guard Sidelying to sit: Min assist     Sit to sidelying: Min guard General bed mobility comments: min guard to roll to pts R side needing tactile cues for hand placement on bed rails, MIN A to elevate trunk into sitting from sidelying. min guard to return to supine mostly for line mgmt and safety  Transfers Overall transfer level: Needs assistance Equipment used: Rolling walker (2 wheeled);4-wheeled walker Transfers: Sit to/from Stand Sit to Stand: Min assist         General transfer comment: cues for hand placement each trial with pt completing x3 sit<>stands needing MIN A and use of momentum to power up into standing    Balance Overall balance assessment: Needs assistance Sitting-balance support: No upper extremity supported;Feet supported Sitting balance-Leahy Scale: Fair Sitting balance - Comments: able to static sit EOB with no UE suport with no LOB   Standing balance support: Bilateral upper extremity supported;During functional activity Standing balance-Leahy Scale: Poor Standing balance comment: reliant on external assist and RW                           ADL either performed or assessed with clinical judgement   ADL Overall ADL's : Needs assistance/impaired                 Upper Body Dressing : Minimal assistance;Sitting Upper Body Dressing Details (indicate cue type and reason): to don gown as back side cover Lower Body Dressing: Min guard;Sitting/lateral leans Lower Body Dressing Details (indicate  cue type and reason): via figure four to adjust socks from EOB Toilet Transfer: Minimal assistance;RW (rollator) Toilet Transfer Details (indicate cue type and reason): sit<>stand from bed only as pt with reports of dizziness and feeling "hot" although BP overall WFL         Functional mobility during ADLs: Minimal assistance;Rolling  walker (sit<>stand only with rollator) General ADL Comments: pt reports feeling lightheaded, dizzy and hot although BP WFL     Vision       Perception     Praxis      Cognition Arousal/Alertness: Awake/alert Behavior During Therapy: WFL for tasks assessed/performed Overall Cognitive Status: Impaired/Different from baseline Area of Impairment: Memory;Following commands;Problem solving                     Memory: Decreased short-term memory (provided instructions for locking brakes on rollator 3x) Following Commands: Follows one step commands with increased time;Follows multi-step commands inconsistently     Problem Solving: Slow processing;Decreased initiation;Difficulty sequencing;Requires verbal cues;Requires tactile cues General Comments: pt very slow to process, needing multimodal cues to problem solve locking brakes on rollator        Exercises     Shoulder Instructions       General Comments pt initially denies s/s of dizziness when transitioning back to bed with RN therefore orthostatics not taken initally at start of session, however once pt EOB pt reports feeeling light headed BP 108/63 HR 84 O2 100%, BP in standing ( reporting feeling hot and dizzy) 109/87 HR 94 bpm O2 100%, BP sitting EOB ( reports feeling woozy) 109/87 HR 86 bpm O2 99 pt on RA during session.     Pertinent Vitals/ Pain       Pain Assessment: Faces Faces Pain Scale: Hurts a little bit Pain Location: ABD, mostly L side Pain Descriptors / Indicators: Grimacing;Discomfort;Tightness Pain Intervention(s): Limited activity within patient's tolerance;Monitored during session;Repositioned  Home Living                                          Prior Functioning/Environment              Frequency  Min 2X/week        Progress Toward Goals  OT Goals(current goals can now be found in the care plan section)  Progress towards OT goals: Progressing toward goals  Acute  Rehab OT Goals Patient Stated Goal: go home or rehab to get stronger OT Goal Formulation: With patient Time For Goal Achievement: 06/08/20 Potential to Achieve Goals: Good  Plan Discharge plan remains appropriate;Frequency remains appropriate    Co-evaluation                 AM-PAC OT "6 Clicks" Daily Activity     Outcome Measure   Help from another person eating meals?: None Help from another person taking care of personal grooming?: A Little Help from another person toileting, which includes using toliet, bedpan, or urinal?: A Little Help from another person bathing (including washing, rinsing, drying)?: A Lot Help from another person to put on and taking off regular upper body clothing?: A Little Help from another person to put on and taking off regular lower body clothing?: A Little 6 Click Score: 18    End of Session Equipment Utilized During Treatment: Gait belt;Rolling walker;Other (comment) (rollator)  OT Visit Diagnosis: Muscle weakness (generalized) (M62.81);Unsteadiness on feet (  R26.81);Pain   Activity Tolerance Treatment limited secondary to medical complications (Comment);Other (comment) (reports of dizziness)   Patient Left in bed;with call bell/phone within reach;with bed alarm set   Nurse Communication Mobility status        Time: 5726-2035 OT Time Calculation (min): 28 min  Charges: OT General Charges $OT Visit: 1 Visit OT Treatments $Self Care/Home Management : 23-37 mins  Harley Alto., COTA/L Acute Rehabilitation Services [phone]   Precious Haws 05/30/2020, 2:27 PM

## 2020-05-30 NOTE — Progress Notes (Signed)
PROGRESS NOTE                                                                                                                                                                                                             Patient Demographics:    Angelica [name], is a 80 y.o. female, DOB - Nov 04, 1940, SWF:[id number]  Outpatient Primary MD for the patient is [name], MD   Admit date - 05/17/2020   LOS - 52  Chief Complaint  Patient presents with  . Abdominal Pain       Brief Narrative: Patient is a 80 y.o. female with PMHx of HTN, severe persistent asthma (on benralizumab infusion every 8 weeks)-prior history of SBO-who presented to the hospital with abdominal pain, nausea/vomiting-she was found to have SBO and incidental COVID infection.  She was subsequently admitted to the hospitalist service.  COVID-19 vaccinated status: Fully vaccinated including booster.  Significant Events: 12/28>> Admit to [facility] for SBO  Significant studies: 12/28>> CT abdomen/pelvis: SBO with transition point in the mid central abdomen 12/28>> CT chest: Left lower lobe mass with left hilar/mediastinal adenopathy, enlarged/heterogeneous thyroid  COVID-19 medications: Monoclonal antibody: 12/29 x 1  Antibiotics: None  Microbiology data: None  Procedures: 1/3>> diagnostic laparoscopy-open lysis of adhesions   Consults: General surgery  DVT prophylaxis:     Subjective:   Seen and examined.  Feels well.  No complaints.  No nausea.  Tolerating soft diet.   Assessment  & Plan :   SBO: Secondary to lesions-s/p laparoscopy and lysis of adhesions-General surgery following and directing care.  She is feeling better.  She is tolerating soft diet.  General surgery has kept her on soft diet again today.  I have sent them a message to see if she can be advanced to regular diet.  Waiting for response again today.  Continues to be on TPN.     COVID-19 infection: Asymptomatic-s/p MAB infusion-apart from close monitoring-no other therapies indicated at this point.  Last day of isolation on 1/6-following which she no longer requires any further isolation.  HTN: Blood pressure better controlled.  Continue home dose of amlodipine and losartan.  GERD: On PPI  Left lung mass: Seen incidentally on imaging studies-will need pulmonary evaluation-bronchoscopy/biopsy-once she completely recovers from her acute illness. My colleague hospitalist discussed with pulmonology-Dr. Hebert Soho will arrange follow-up in  the next 1-2 weeks with his office, and then a subsequent biopsy.  Epic message with patient demographics sent to him as well.  Patient's daughter had prohibited hospitalist from breaking the news as she wanted to do it herself over the weekend.  I discussed this topic with her today and she has yet not discussed that with her mother and she is requesting that we break the news when she is closer to discharge.  Enlarged/heterogeneous thyroid gland: Seen incidentally on CT chest-we will need outpatient thyroid ultrasound.  History of severe persistent asthma: Stable-continue Symbicort-on as needed albuterol infusion.  Follows with allergy/immunology and is on on benralizumab infusion every 8 weeks. Resume montelukast. She takes as needed nightly.   ABG:    Component Value Date/Time   TCO2 25 08/22/2016 1818    Vent Settings: N/A    Condition -Stable  Family Communication  :  [phone] updated over the phone 05/29/2020.  Code Status :  Full Code  Diet :  Diet Order            DIET SOFT Room service appropriate? Yes; Fluid consistency: Thin  Diet effective now                  Disposition Plan  :   Status is: Inpatient  Remains inpatient appropriate because:Inpatient level of care appropriate due to severity of illness   Dispo: The patient is from: Home              Anticipated d/c is to: Home with  home health              Anticipated d/c date is: 1 to 2 days              Patient currently is not medically stable to d/c.  Barriers to discharge: SBO-s/p laparoscopic lysis of adhesions-awaiting return of bowel function/advancing diet.  Antimicorbials  :    Anti-infectives (From admission, onward)   Start     Dose/Rate Route Frequency Ordered Stop   05/23/20 1300  ceFAZolin (ANCEF) IVPB 2g/100 mL premix        2 g 200 mL/hr over 30 Minutes Intravenous On call to O.R. 05/23/20 1107 05/23/20 1448      Inpatient Medications  Scheduled Meds: . amLODipine  10 mg Oral Daily  . Chlorhexidine Gluconate Cloth  6 each Topical Daily  . feeding supplement  1 Container Oral TID BM  . guaiFENesin-dextromethorphan  5 mL Oral Once  . insulin aspart  0-9 Units Subcutaneous Q6H  . losartan  100 mg Oral Daily  . mometasone-formoterol  2 puff Inhalation BID  . multivitamin with minerals  1 tablet Oral Daily  . pantoprazole  40 mg Oral QHS   Continuous Infusions: . methocarbamol (ROBAXIN) IV 500 mg (05/26/20 2305)  . TPN ADULT (ION) 35 mL/hr at 05/29/20 1748  . TPN ADULT (ION)     PRN Meds:.acetaminophen, albuterol, EPINEPHrine, fentaNYL (SUBLIMAZE) injection, haloperidol lactate, labetalol, methocarbamol (ROBAXIN) IV, montelukast, ondansetron **OR** ondansetron (ZOFRAN) IV, promethazine, simethicone, sodium chloride flush   Time Spent in minutes 27  See all Orders from today for further details   Darliss Cheney M.D on 05/30/2020 at 1:22 PM  To page go to [email or web address] - use universal password  [facility] -  Office  [phone]    Objective:   Vitals:   05/29/20 2017 05/29/20 2100 05/30/20 0524 05/30/20 0732  BP: 109/74  125/67   Pulse: 76  75   Resp: 16  Temp: 98.4 F (36.9 C)  98.4 F (36.9 C)   TempSrc: Oral  Oral   SpO2: 99% 98% 100% 97%  Weight:      Height:        Wt Readings from Last 3 Encounters:  05/19/20 79.6 kg  05/17/20 81.6 kg  05/03/20 82.6 kg      Intake/Output Summary (Last 24 hours) at 05/30/2020 1322 Last data filed at 05/30/2020 0915 Gross per 24 hour  Intake 340 ml  Output 1000 ml  Net -660 ml     Physical Exam  General exam: Appears calm and comfortable  Respiratory system: Clear to auscultation. Respiratory effort normal. Cardiovascular system: S1 & S2 heard, RRR. No JVD, murmurs, rubs, gallops or clicks. No pedal edema. Gastrointestinal system: Abdomen is nondistended, soft and nontender. No organomegaly or masses felt. Normal bowel sounds heard. Central nervous system: Alert and oriented. No focal neurological deficits. Extremities: Symmetric 5 x 5 power. Skin: No rashes, lesions or ulcers.  Psychiatry: Judgement and insight appear normal. Mood & affect appropriate.    Data Review:    CBC Recent Labs  Lab 05/25/20 0348 05/26/20 0500 05/30/20 0340  WBC 12.1* 10.9* 12.8*  HGB 12.2 11.0* 11.3*  HCT 38.0 32.9* 35.0*  PLT 247 230 253  MCV 92.5 91.1 91.4  MCH 29.7 30.5 29.5  MCHC 32.1 33.4 32.3  RDW 13.6 13.4 13.5  LYMPHSABS  --   --  2.5  MONOABS  --   --  1.3*  EOSABS  --   --  0.0  BASOSABS  --   --  0.0    Chemistries  Recent Labs  Lab 05/24/20 0417 05/25/20 0348 05/26/20 0500 05/27/20 0424 05/29/20 0055 05/30/20 0340  NA 142 138 135 135 134* 135  K 3.6 4.0 4.3 4.5 4.5 4.0  CL 111 109 103 101 102 102  CO2 23 23 24 26 23 24   GLUCOSE 191* 91 172* 146* 109* 116*  BUN 22 21 15 14 18 14   CREATININE 0.76 0.66 0.65 0.73 0.74 0.77  CALCIUM 8.5* 8.2* 8.3* 8.6* 8.3* 8.6*  MG 1.9 2.0 1.7 2.0  --  1.9  AST  --   --  18  --   --  140*  ALT  --   --  17  --   --  245*  ALKPHOS  --   --  60  --   --  107  BILITOT  --   --  0.6  --   --  0.6   ------------------------------------------------------------------------------------------------------------------ Recent Labs    05/30/20 0340  TRIG 99    Lab Results  Component Value Date   HGBA1C 5.8 05/03/2020    ------------------------------------------------------------------------------------------------------------------ No results for input(s): TSH, T4TOTAL, T3FREE, THYROIDAB in the last 72 hours.  Invalid input(s): FREET3 ------------------------------------------------------------------------------------------------------------------ No results for input(s): VITAMINB12, FOLATE, FERRITIN, TIBC, IRON, RETICCTPCT in the last 72 hours.  Coagulation profile No results for input(s): INR, PROTIME in the last 168 hours.  No results for input(s): DDIMER in the last 72 hours.  Cardiac Enzymes No results for input(s): CKMB, TROPONINI, MYOGLOBIN in the last 168 hours.  Invalid input(s): CK ------------------------------------------------------------------------------------------------------------------ No results found for: BNP  Micro Results No results found for this or any previous visit (from the past 240 hour(s)).  Radiology Reports DG Abd 1 View  Result Date: 05/21/2020 CLINICAL DATA:  80 year old female with small bowel obstruction. EXAM: ABDOMEN - 1 VIEW COMPARISON:  CT Abdomen and Pelvis 05/17/2020. Postcontrast abdominal radiographs  05/19/2020. FINDINGS: Portable AP supine views since 05/19/2020 the oral contrast has become dilute throughout the abdomen and pelvis. Still, faint contrast is visible within a dilated left abdominal loop seen previously (arrow). And there is no convincing large bowel contrast. Stable enteric tube visible in the epigastrium. Stable abdominal and pelvic visceral contours. Stable visualized osseous structures. At 1313 hours. IMPRESSION: 1. Persistent small bowel obstruction, with no evidence of improvement since 05/19/2020. 2. Stable enteric tube. Electronically Signed   By: Genevie Ann M.D.   On: 05/21/2020 13:31   CT CHEST W CONTRAST  Result Date: 05/17/2020 CLINICAL DATA:  May 17, 2020, abdominal CT. FINDINGS: Cardiovascular: Atheromatous plaque in the  thoracic aorta. No aneurysmal dilation. Noncalcified plaque as well. Normal heart size. Three-vessel coronary artery disease. Central pulmonary vasculature unremarkable on venous phase assessment. Mediastinum/Nodes: Heterogeneous and enlarged thyroid gland, multinodular changes. No axillary lymphadenopathy. Enlarged peribronchial lymph node on image 69 of series 3 a 13 mm. Borderline enlarged LEFT infrahilar lymph node at 9 mm on image 71 of series 3. Subcarinal lymph node at 14 mm on image 61 of series 3. No additional mediastinal or hilar nodal enlargement. Esophagus mildly patulous. Lungs/Pleura: Pulmonary mass measuring 3.5 x 2.8 cm on image 94 of series 4 with spiculated margins contacting the visceral pleura in the LEFT lower lobe, situated in the medial aspect of the LEFT lower lobe. Direct extension of the mass versus is infra hilar lymph node on image 88 of series 4 measuring 13 mm. Bronchial narrowing of secondary bronchial structures at the level of the mass beneath the LEFT hilum. Airways are otherwise patent. Tiny nodule in the RIGHT chest on image 75 series 4 approximately 2-3 mm. Upper Abdomen: Incidental imaging of upper abdominal contents again with small amount of perisplenic fluid and marked small bowel dilation. Visible adrenal glands are normal. There is excreted contrast within the kidneys partially visualized. Musculoskeletal: No acute musculoskeletal finding. No destructive bone lesion. Spinal degenerative changes. IMPRESSION: 1. LEFT lower lobe mass with LEFT hilar and mediastinal adenopathy most suggestive of bronchogenic carcinoma. Referral to multi disciplinary thoracic oncologic setting with PET or biopsy for further assessment is suggested. 2. Tiny 2-3 mm nodule in the RIGHT chest on image 75 series 4. Attention on follow-up. 3. Signs of bowel obstruction and fluid in the upper abdomen about the spleen partially imaged. 4. Enlarged and heterogeneous thyroid. Recommend thyroid ultrasound  (ref: J Am Coll Radiol. 2015 Feb;12(2): 143-50). 5. Three-vessel coronary artery disease. 6. Aortic atherosclerosis Aortic Atherosclerosis (ICD10-I70.0). Electronically Signed   By: Zetta Bills M.D.   On: 05/17/2020 21:21   CT ABDOMEN PELVIS W CONTRAST  Result Date: 05/17/2020 CLINICAL DATA:  Abdominal pain. Vomiting. Possible bowel obstruction on radiograph. EXAM: CT ABDOMEN AND PELVIS WITH CONTRAST TECHNIQUE: Multidetector CT imaging of the abdomen and pelvis was performed using the standard protocol following bolus administration of intravenous contrast. CONTRAST:  132mL OMNIPAQUE IOHEXOL 300 MG/ML  SOLN COMPARISON:  Radiographs earlier today.  CT 02/04/2017 FINDINGS: Lower chest: Lobulated 2.5 cm left lower lobe pulmonary nodule with irregular margins, increased in size from prior exam. No pleural fluid or acute airspace disease. Hepatobiliary: Mild hepatic steatosis. No focal hepatic lesion. Gallbladder physiologically distended, no calcified stone. No biliary dilatation. Pancreas: Parenchymal atrophy. No ductal dilatation or inflammation. Spleen: Normal in size without focal abnormality. Adrenals/Urinary Tract: Normal adrenal glands. No hydronephrosis or perinephric edema. Homogeneous renal enhancement with symmetric excretion on delayed phase imaging. Tiny hypodensity in the upper left kidney is  too small to accurately characterize. Urinary bladder is physiologically distended without wall thickening. Slight cystocele. Stomach/Bowel: Fluid-filled distended gastric cardia. Dilated fluid-filled proximal small bowel. Transition from dilated to nondilated in the mid central abdomen, series 4, image 51. More distal small bowel is decompressed. Mild mesenteric edema and small free fluid. No pneumatosis. The appendix is not confidently visualized on the current exam. Majority of the colon is decompressed. Occasional descending and sigmoid diverticula without diverticulitis. There is pelvic floor descent with  small-bowel loops coursing posterior to the bladder. Vascular/Lymphatic: No acute vascular findings are evidence of vascular compromise. Aortic atherosclerosis. Patent portal vein. Patent mesenteric vessels. No enlarged lymph nodes in the abdomen or pelvis. Reproductive: Status post hysterectomy. No adnexal masses. Other: Small volume of non organized free fluid in both upper quadrants, scattered throughout the mesentery and in the pelvis. No free air or perforation. No focal fluid collection/abscess. Small fat containing upper ventral abdominal wall hernia. Musculoskeletal: Diffuse lumbar degenerative change. No acute osseous abnormality or focal osseous lesion. Scattered subcutaneous granuloma in both gluteal regions/flanks. IMPRESSION: 1. Small bowel obstruction with transition point in the mid central abdomen, likely due to adhesions. Small volume of non organized free fluid in the abdomen and pelvis. No perforation or evidence of ischemia. 2. Lobulated 2.5 cm left lower lobe pulmonary nodule with irregular margins, increased in size from prior exam. This is suspicious for primary bronchogenic malignancy. Recommend dedicated chest CT (with IV contrast) for complete evaluation of the thorax. 3. Mild hepatic steatosis. 4. Pelvic floor descent with small-bowel loops coursing posterior to the bladder. Aortic Atherosclerosis (ICD10-I70.0). Electronically Signed   By: Keith Rake M.D.   On: 05/17/2020 19:40   DG ABD ACUTE 2+V W 1V CHEST  Result Date: 05/17/2020 CLINICAL DATA:  Chronic upper abdominal pain EXAM: DG ABDOMEN ACUTE WITH 1 VIEW CHEST COMPARISON:  08/15/2017 FINDINGS: Supine and upright frontal views of the abdomen and pelvis as well as an upright frontal view of the chest are obtained. Cardiac silhouette is stable. No airspace disease, effusion, or pneumothorax. There is a dilated loop of distended gas-filled small bowel within the left upper quadrant, measuring up to 5.2 cm in diameter. Gas  fluid level seen within the left upper quadrant. There is a paucity of distal small bowel gas and colonic gas. No free gas in the greater peritoneal sac. No masses or abnormal calcifications. IMPRESSION: 1. Findings compatible with high-grade small bowel obstruction. 2. No acute intrathoracic process. Electronically Signed   By: Randa Ngo M.D.   On: 05/17/2020 16:24   DG Abd Portable 1V  Result Date: 05/23/2020 CLINICAL DATA:  SBO EXAM: PORTABLE ABDOMEN - 1 VIEW COMPARISON:  05/22/2020 and prior. FINDINGS: Dilated right hemiabdomen small bowel loop measuring up to 5.3 cm. No evidence of pneumoperitoneum. Multilevel spondylosis. Bilateral body wall calcified granulomas. IMPRESSION: Dilated right hemiabdomen small bowel loop measuring up to 5.3 cm. Electronically Signed   By: Primitivo Gauze M.D.   On: 05/23/2020 08:43   DG Abd Portable 1V  Result Date: 05/22/2020 CLINICAL DATA:  Vomiting, abdominal pain EXAM: PORTABLE ABDOMEN - 1 VIEW COMPARISON:  05/21/2020 FINDINGS: NG tube remains in the stomach. Decreasing bowel distension. No organomegaly or free air. Small left pleural effusion noted with left base atelectasis. IMPRESSION: Improving bowel gas pattern with decreasing bowel distention since prior study. NG tube remains in the stomach. Electronically Signed   By: Rolm Baptise M.D.   On: 05/22/2020 11:33   DG Abd Portable 1V-Small Bowel Obstruction  Protocol-initial, 8 hr delay  Result Date: 05/19/2020 CLINICAL DATA:  Small-bowel obstruction EXAM: PORTABLE ABDOMEN - 1 VIEW COMPARISON:  05/18/2020 FINDINGS: Two supine frontal views of the abdomen and pelvis are obtained. Enteric catheter tip and side port project over the gastric fundus. There is excreted contrast within the bladder related to previous CT. Oral contrast is seen within the stomach and duodenal sweep. Dilute contrast is seen filling dilated loops of small bowel within the left mid abdomen. No evidence of distal small bowel or  colonic contrast. IMPRESSION: 1. No evidence of colonic contrast. Persistent dilated small bowel consistent with small-bowel obstruction. Electronically Signed   By: Randa Ngo M.D.   On: 05/19/2020 20:18   DG Abd Portable 1V-Small Bowel Obstruction Protocol-initial, 8 hr delay  Result Date: 05/18/2020 CLINICAL DATA:  Small bowel obstruction EXAM: PORTABLE ABDOMEN - 1 VIEW COMPARISON:  05/17/2020 FINDINGS: Enteric tube has been placed with tip in the left upper quadrant. The proximal side hole projects over the EG junction region, possibly at or just below the EG junction. Advancement is suggested. Persistent gaseous distention of left upper quadrant bowel, similar to prior study. Residual contrast material in the bladder. IMPRESSION: Enteric tube tip is in the left upper quadrant with proximal side hole at or just below the EG junction. Advancement is suggested. Electronically Signed   By: Lucienne Capers M.D.   On: 05/18/2020 17:22   DG Abd Portable 1 View  Result Date: 05/17/2020 CLINICAL DATA:  Nasogastric tube placement EXAM: PORTABLE ABDOMEN - 1 VIEW COMPARISON:  05/17/2020 FINDINGS: Nasogastric tube extends into the upper abdomen with its tip overlying the expected proximal to mid body of the stomach. Visualized lung bases are clear. Dilated loops of gas-filled small bowel are seen within the left upper quadrant suggestive of underlying small-bowel obstruction, however, the mid and inferior abdomen are excluded from view. No gross free intraperitoneal gas within the visualized abdomen. IMPRESSION: Nasogastric tube extends into the proximal to mid body of the stomach. Electronically Signed   By: Fidela Salisbury MD   On: 05/17/2020 23:03   [place] EKG SITE RITE  Result Date: 05/22/2020 If Site Rite image not attached, placement could not be confirmed due to current cardiac rhythm.

## 2020-05-30 NOTE — Progress Notes (Signed)
OT Cancellation Note  Patient Details Name: Angelica [name] MRN: [id number] DOB: August 27, 1940   Cancelled Treatment:    Reason Eval/Treat Not Completed: Other (comment) pt up in chair eating lunch upon OTA arrival, pt requesting to finish lunch prior to OT session. Will check back as time allows for session.  Corinne Ports K., COTA/L Acute Rehabilitation Services [phone]   Precious Haws 05/30/2020, 12:16 PM

## 2020-05-30 NOTE — Progress Notes (Signed)
Nutrition Follow-up  DOCUMENTATION CODES:   Not applicable  INTERVENTION:   -Initiate 48 hour calorie count per MD -D/c Ensure Enlive po BID, each supplement provides 350 kcal and 20 grams of protein -Boost Breeze po TID, each supplement provides 250 kcal and 9 grams of protein -Continue MVI with minerals daily  NUTRITION DIAGNOSIS:   Inadequate oral intake related to inability to eat as evidenced by NPO status.  Progressing; advanced to soft diet on 05/28/20  GOAL:   Patient will meet greater than or equal to 90% of their needs  Progressing   MONITOR:   PO intake,Supplement acceptance,Diet advancement,Labs,Weight trends,Skin,I & O's  REASON FOR ASSESSMENT:   Malnutrition Screening Tool    ASSESSMENT:   80 y.o. female with medical history significant for hypertension and thyroid disease, SBO in 2016 presents with abdominal pain, nausea, and vomiting. 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  Procedure(1/3):diagnostic laparoscopic, open lysis of adhesions, enterrorhaphy 1/4- NGT d/c 1/6- advanced to clear liquid diet 1/7- advanced to full liquid diet 1/8- advanced to soft diet, TPN halved  Reviewed I/O's: -700 ml x 24 hours and +2.7 L since admission  UOP: 1000 ml x 24 hours  Spoke with pt, who was sitting in recliner char at time of visit. She reports that she is happy to advanced to solid foods and is tolerating well. Pt consuming a chicken sandwich and french fries at time of visit. Pt reports she consumed "almost all" of her breakfast this morning.   Pt shares she does not like the Ensure supplements "because they hurt my stomach", however, willing to resume Boost Breeze supplements. RD discussed importance of good meal and supplement intake to promote healing.    05/31/19 Breakfast: 370 kcals, 18 grams protein  Discussed with pt plan for calorie count to help determine when to d/c TPN. Pt in agreement with plan.   Per pharmacy note, plan to continue 2-in-1 TPN with Tues/Fri Intralipids due to allergy and to minimize waste during shortage. Pt receiving TPN at 35 ml/hr and Intralipids at 20 ml/hr x 12 hours on Tuesday and Fridays. TPN proving daily average of 994 kcals and 43 grams protein, meeting 55% of estimated kcal needs and51% of estimated protein needs.   Labs reviewed: CBGS: 120-135 (inpatient orders for glycemic control are 0-9 units insulin aspart every 6 hours).   Diet Order:   Diet Order            DIET SOFT Room service appropriate? Yes; Fluid consistency: Thin  Diet effective now                 EDUCATION NEEDS:   Not appropriate for education at this time  Skin:  Skin Assessment: Skin Integrity Issues: Skin Integrity Issues:: Incisions Incisions: closed abdomen  Last BM:  05/29/20  Height:   Ht Readings from Last 1 Encounters:  05/19/20 5\' 5"  (1.651 m)    Weight:   Wt Readings from Last 1 Encounters:  05/19/20 79.6 kg   BMI:  Body mass index is 29.2 kg/m.  Estimated Nutritional Needs:   Kcal:  1800-1950  Protein:  85-95 grams  Fluid:  >/= 1.8 L/day    Loistine Chance, RD, LDN, [place] Registered Dietitian II Certified Diabetes Care and Education Specialist Please refer to [facility] for RD and/or RD on-call/weekend/after  hours pager

## 2020-05-31 ENCOUNTER — Inpatient Hospital Stay (HOSPITAL_COMMUNITY): Payer: Medicare HMO

## 2020-05-31 DIAGNOSIS — K56609 Unspecified intestinal obstruction, unspecified as to partial versus complete obstruction: Secondary | ICD-10-CM | POA: Diagnosis not present

## 2020-05-31 LAB — GLUCOSE, CAPILLARY
Glucose-Capillary: 111 mg/dL — ABNORMAL HIGH (ref 70–99)
Glucose-Capillary: 130 mg/dL — ABNORMAL HIGH (ref 70–99)
Glucose-Capillary: 134 mg/dL — ABNORMAL HIGH (ref 70–99)
Glucose-Capillary: 144 mg/dL — ABNORMAL HIGH (ref 70–99)
Glucose-Capillary: 148 mg/dL — ABNORMAL HIGH (ref 70–99)

## 2020-05-31 IMAGING — DX DG ABD PORTABLE 1V
2 series · 2 of 2 positions shown · non-contrast
Comparison: [DATE].

CLINICAL DATA: Postop state.

EXAM:
PORTABLE ABDOMEN - 1 VIEW

[abdomen supine (1 of 2)]
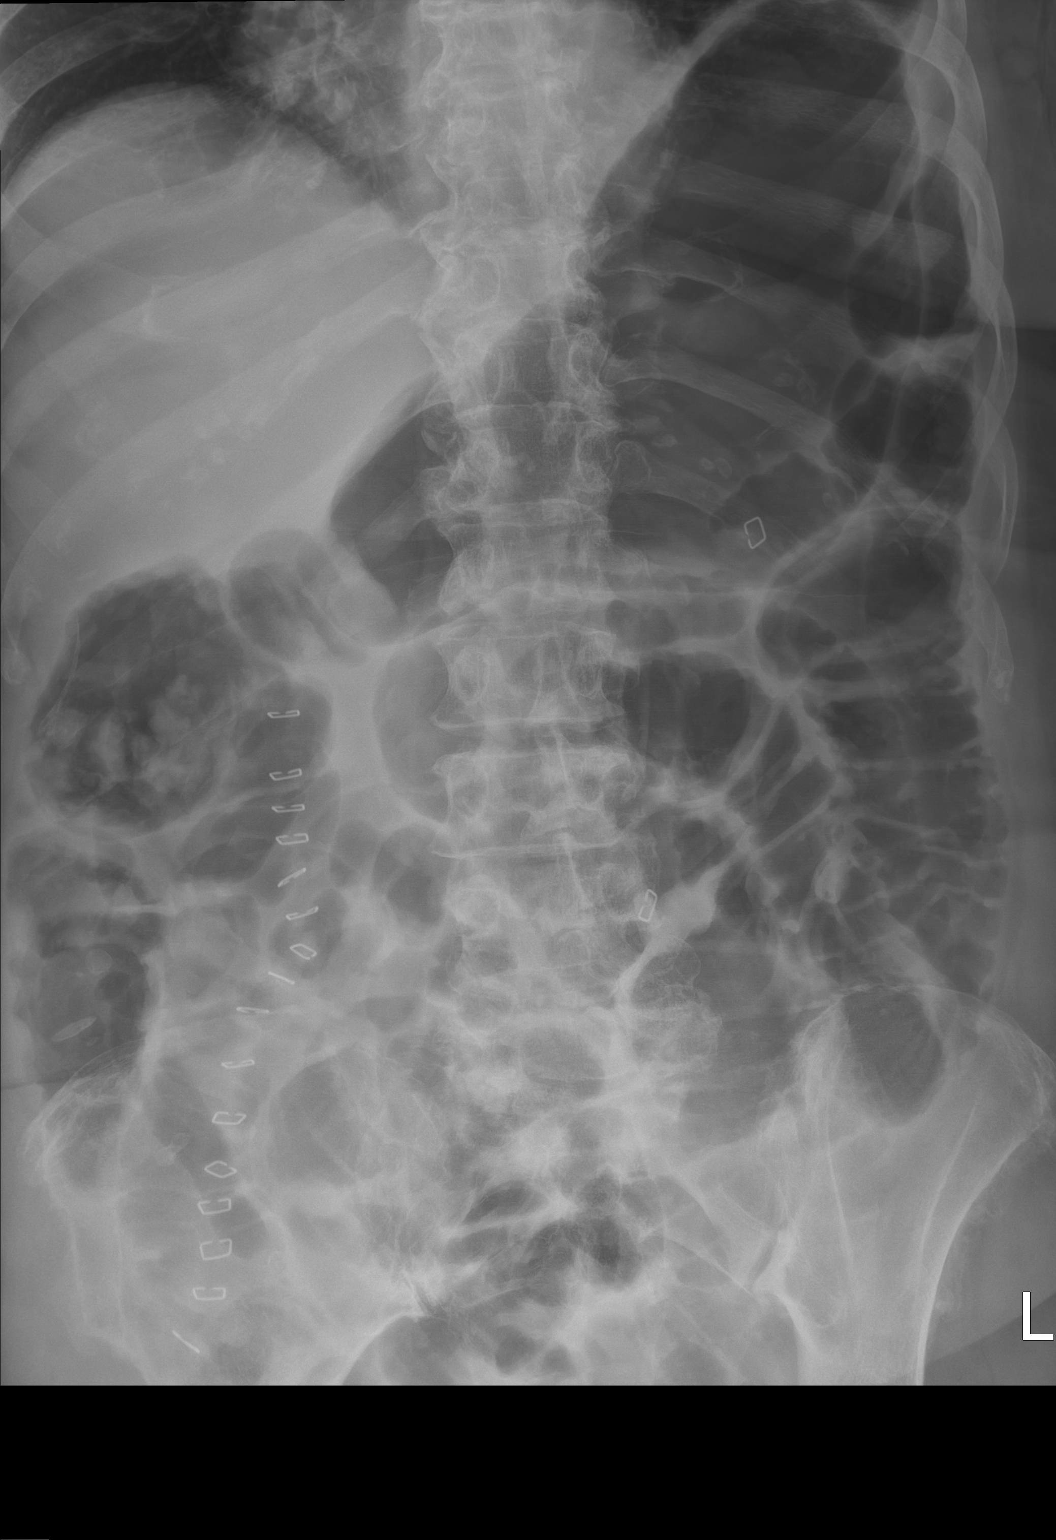

[abdomen supine (2 of 2)]
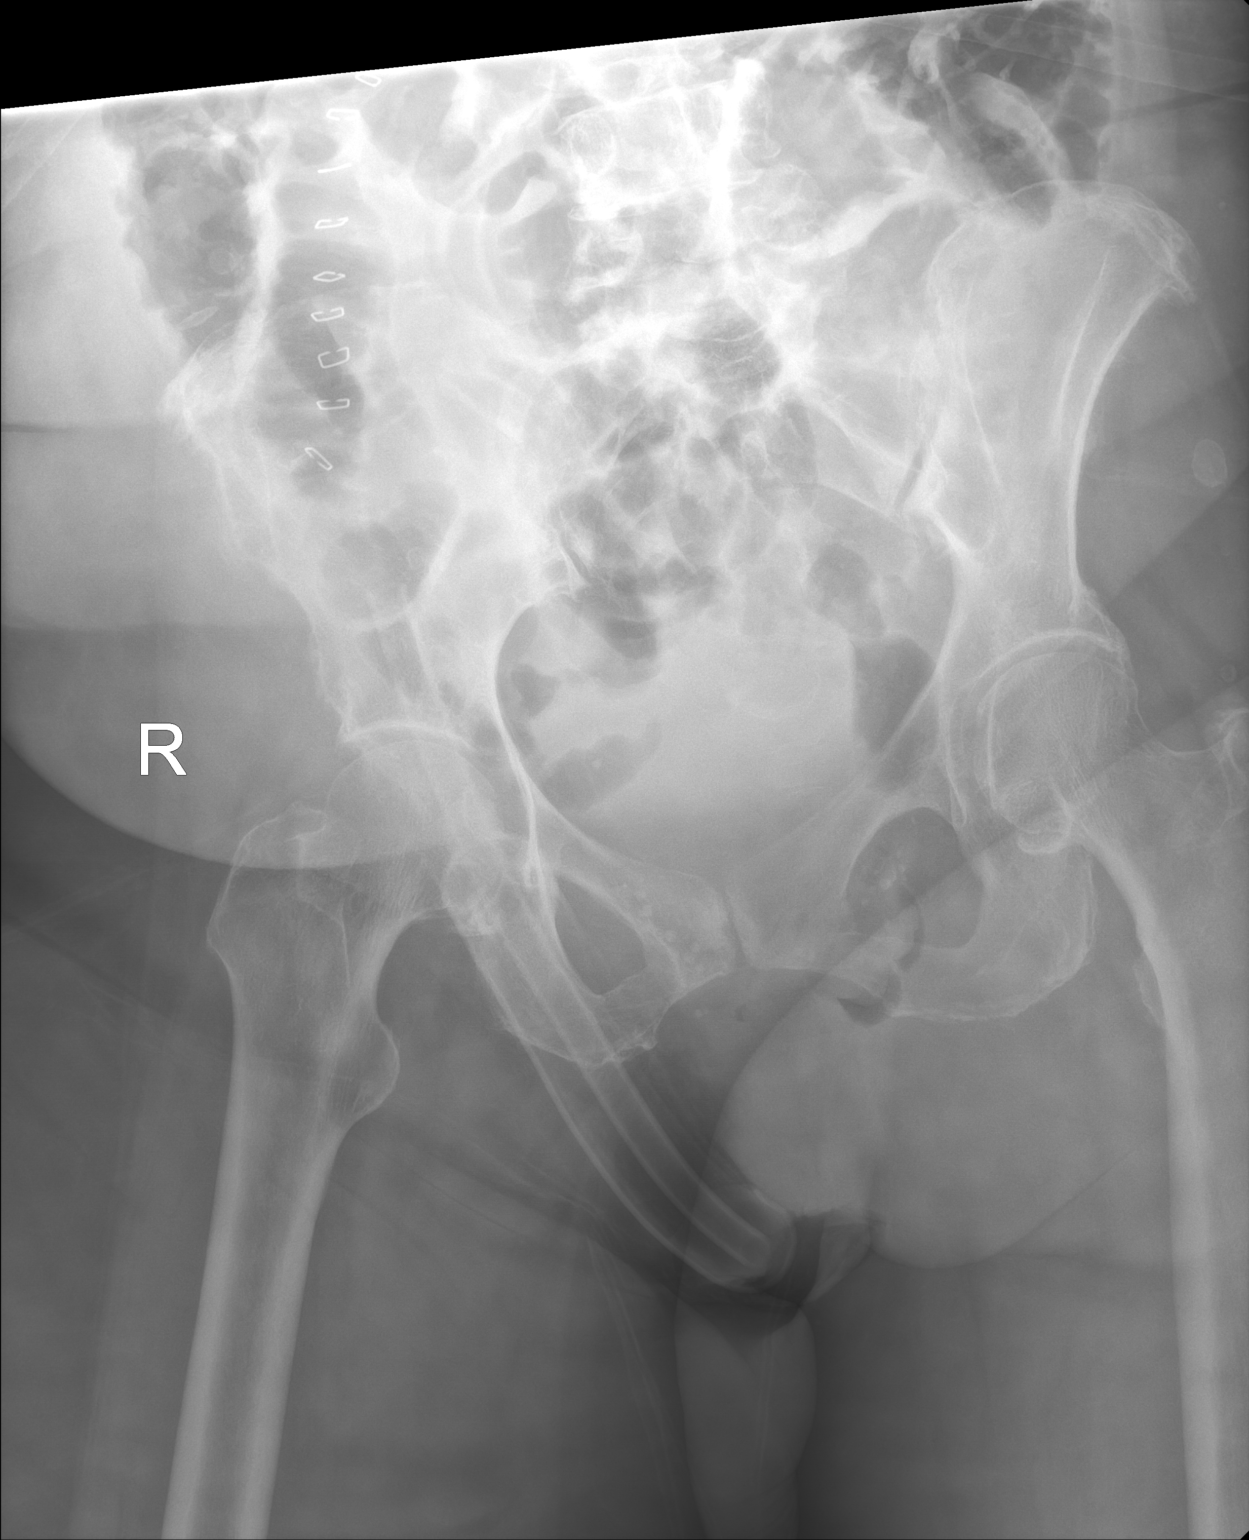

[2 of 2 positions shown; findings below may reference images not displayed]

FINDINGS: Surgical staples noted over the abdomen. Gastric distention.
Distention of multiple loops of small bowel noted on today's exam.
Single large loop of dilated small bowel in the right upper abdomen
is less dilated on today's exam. Colon is nondistended. No free air
identified. Degenerative change thoracolumbar spine.
IMPRESSION: 1. Gastric distention.
2. Distention of multiple loops of small bowel noted on today's
exam. Single large loop of dilated small bowel in the right upper
abdomen is less dilated on today's exam. Colon is nondistended. No
free air identified. Continued follow-up exam suggested to
demonstrate resolution of gastric and small bowel distention.

## 2020-05-31 MED ORDER — STERILE WATER FOR INJECTION IV SOLN
INTRAVENOUS | Status: DC
  Filled 2020-05-31: qty 285.6

## 2020-05-31 MED ORDER — FAT EMULSION PLANT BASED 20% (INTRALIPID)IV EMUL
240.0000 mL | INTRAVENOUS | Status: DC
  Filled 2020-05-31: qty 250

## 2020-05-31 MED ORDER — PROSOURCE PLUS PO LIQD
30.0000 mL | Freq: Two times a day (BID) | ORAL | Status: DC
  Administered 2020-06-01: 30 mL via ORAL
  Filled 2020-05-31: qty 30

## 2020-05-31 NOTE — Progress Notes (Signed)
PROGRESS NOTE                                                                                                                                                                                                             Patient Demographics:    Loveta Dellis, is a 80 y.o. female, DOB - 04-19-1941, OAC:[id number]  Outpatient Primary MD for the patient is [name], MD   Admit date - 05/17/2020   LOS - 91  Chief Complaint  Patient presents with  . Abdominal Pain       Brief Narrative: Patient is a 80 y.o. female with PMHx of HTN, severe persistent asthma (on benralizumab infusion every 8 weeks)-prior history of SBO-who presented to the hospital with abdominal pain, nausea/vomiting-she was found to have SBO and incidental COVID infection.  She was subsequently admitted to the hospitalist service.  COVID-19 vaccinated status: Fully vaccinated including booster.  Significant Events: 12/28>> Admit to [facility] for SBO  Significant studies: 12/28>> CT abdomen/pelvis: SBO with transition point in the mid central abdomen 12/28>> CT chest: Left lower lobe mass with left hilar/mediastinal adenopathy, enlarged/heterogeneous thyroid  COVID-19 medications: Monoclonal antibody: 12/29 x 1  Antibiotics: None  Microbiology data: None  Procedures: 1/3>> diagnostic laparoscopy-open lysis of adhesions   Consults: General surgery  DVT prophylaxis:     Subjective:   Seen and examined.  No complaints.  Tolerating soft diet.  No nausea.   Assessment  & Plan :   SBO: Secondary to lesions-s/p laparoscopy and lysis of adhesions-General surgery following and directing care.  She is feeling better.  She is tolerating soft diet.  General surgery plans to do calorie count for 1 to 2 days to see if we can stop her TPN before we discharge her.  Continues to be on TPN.   COVID-19 infection: Asymptomatic-s/p MAB infusion-apart from close  monitoring-no other therapies indicated at this point.  Last day of isolation on 1/6-following which she no longer requires any further isolation.  HTN: Blood pressure better controlled.  Continue home dose of amlodipine and losartan.  GERD: On PPI  Left lung mass: Seen incidentally on imaging studies-will need pulmonary evaluation-bronchoscopy/biopsy-once she completely recovers from her acute illness. My colleague hospitalist discussed with pulmonology-Dr. Hebert Soho will arrange follow-up in the next 1-2 weeks with his office, and then a subsequent biopsy.  Epic message with patient demographics sent to him as well.  Patient's daughter had prohibited hospitalist from breaking the news as she wanted to do it herself over the weekend.  I discussed this topic with her today and she has yet not discussed that with her mother and she is requesting that we break the news when she is closer to discharge.  Enlarged/heterogeneous thyroid gland: Seen incidentally on CT chest-we will need outpatient thyroid ultrasound.  History of severe persistent asthma: Stable-continue Symbicort-on as needed albuterol infusion.  Follows with allergy/immunology and is on on benralizumab infusion every 8 weeks. Resume montelukast. She takes as needed nightly.   ABG:    Component Value Date/Time   TCO2 25 08/22/2016 1818    Vent Settings: N/A    Condition -Stable  Family Communication  :  [phone] updated over the phone 05/29/2020.  Code Status :  Full Code  Diet :  Diet Order            DIET SOFT Room service appropriate? Yes; Fluid consistency: Thin  Diet effective now                  Disposition Plan  :   Status is: Inpatient  Remains inpatient appropriate because:Inpatient level of care appropriate due to severity of illness   Dispo: The patient is from: Home              Anticipated d/c is to: Home with home health              Anticipated d/c date is: 1 to 2 days               Patient currently is not medically stable to d/c.  Barriers to discharge: SBO-s/p laparoscopic lysis of adhesions-awaiting return of bowel function/advancing diet.  Antimicorbials  :    Anti-infectives (From admission, onward)   Start     Dose/Rate Route Frequency Ordered Stop   05/23/20 1300  ceFAZolin (ANCEF) IVPB 2g/100 mL premix        2 g 200 mL/hr over 30 Minutes Intravenous On call to O.R. 05/23/20 1107 05/23/20 1448      Inpatient Medications  Scheduled Meds: . amLODipine  10 mg Oral Daily  . Chlorhexidine Gluconate Cloth  6 each Topical Daily  . feeding supplement  1 Container Oral TID BM  . guaiFENesin-dextromethorphan  5 mL Oral Once  . insulin aspart  0-9 Units Subcutaneous Q6H  . losartan  100 mg Oral Daily  . mometasone-formoterol  2 puff Inhalation BID  . multivitamin with minerals  1 tablet Oral Daily  . pantoprazole  40 mg Oral QHS   Continuous Infusions: . fat emul(INTRAlipid)    . methocarbamol (ROBAXIN) IV 500 mg (05/26/20 2305)  . TPN ADULT (ION) 35 mL/hr at 05/31/20 0304  . TPN ADULT (ION)     PRN Meds:.acetaminophen, albuterol, EPINEPHrine, fentaNYL (SUBLIMAZE) injection, haloperidol lactate, labetalol, methocarbamol (ROBAXIN) IV, montelukast, ondansetron **OR** ondansetron (ZOFRAN) IV, promethazine, simethicone, sodium chloride flush   Time Spent in minutes 26  See all Orders from today for further details   Darliss Cheney M.D on 05/31/2020 at 10:11 AM  To page go to [email or web address] - use universal password  [facility] -  Office  [phone]    Objective:   Vitals:   05/30/20 1449 05/30/20 2023 05/30/20 2044 05/31/20 0442  BP: 102/64  (!) 109/56 123/67  Pulse: 80  75 77  Resp:   16 19  Temp: 98 F (  36.7 C)  98.6 F (37 C) 98.6 F (37 C)  TempSrc: Oral  Oral Oral  SpO2: 100% 95% 100% 100%  Weight:      Height:        Wt Readings from Last 3 Encounters:  05/19/20 79.6 kg  05/17/20 81.6 kg  05/03/20 82.6 kg      Intake/Output Summary (Last 24 hours) at 05/31/2020 1011 Last data filed at 05/31/2020 0442 Gross per 24 hour  Intake 1228.95 ml  Output --  Net 1228.95 ml     Physical Exam  General exam: Appears calm and comfortable  Respiratory system: Clear to auscultation. Respiratory effort normal. Cardiovascular system: S1 & S2 heard, RRR. No JVD, murmurs, rubs, gallops or clicks. No pedal edema. Gastrointestinal system: Abdomen is nondistended, soft and nontender. No organomegaly or masses felt. Normal bowel sounds heard. Central nervous system: Alert and oriented. No focal neurological deficits. Extremities: Symmetric 5 x 5 power. Skin: No rashes, lesions or ulcers.     Data Review:    CBC Recent Labs  Lab 05/25/20 0348 05/26/20 0500 05/30/20 0340  WBC 12.1* 10.9* 12.8*  HGB 12.2 11.0* 11.3*  HCT 38.0 32.9* 35.0*  PLT 247 230 253  MCV 92.5 91.1 91.4  MCH 29.7 30.5 29.5  MCHC 32.1 33.4 32.3  RDW 13.6 13.4 13.5  LYMPHSABS  --   --  2.5  MONOABS  --   --  1.3*  EOSABS  --   --  0.0  BASOSABS  --   --  0.0    Chemistries  Recent Labs  Lab 05/25/20 0348 05/26/20 0500 05/27/20 0424 05/29/20 0055 05/30/20 0340  NA 138 135 135 134* 135  K 4.0 4.3 4.5 4.5 4.0  CL 109 103 101 102 102  CO2 23 24 26 23 24   GLUCOSE 91 172* 146* 109* 116*  BUN 21 15 14 18 14   CREATININE 0.66 0.65 0.73 0.74 0.77  CALCIUM 8.2* 8.3* 8.6* 8.3* 8.6*  MG 2.0 1.7 2.0  --  1.9  AST  --  18  --   --  140*  ALT  --  17  --   --  245*  ALKPHOS  --  60  --   --  107  BILITOT  --  0.6  --   --  0.6   ------------------------------------------------------------------------------------------------------------------ Recent Labs    05/30/20 0340  TRIG 99    Lab Results  Component Value Date   HGBA1C 5.8 05/03/2020   ------------------------------------------------------------------------------------------------------------------ No results for input(s): TSH, T4TOTAL, T3FREE, THYROIDAB in  the last 72 hours.  Invalid input(s): FREET3 ------------------------------------------------------------------------------------------------------------------ No results for input(s): VITAMINB12, FOLATE, FERRITIN, TIBC, IRON, RETICCTPCT in the last 72 hours.  Coagulation profile No results for input(s): INR, PROTIME in the last 168 hours.  No results for input(s): DDIMER in the last 72 hours.  Cardiac Enzymes No results for input(s): CKMB, TROPONINI, MYOGLOBIN in the last 168 hours.  Invalid input(s): CK ------------------------------------------------------------------------------------------------------------------ No results found for: BNP  Micro Results No results found for this or any previous visit (from the past 240 hour(s)).  Radiology Reports DG Abd 1 View  Result Date: 05/21/2020 CLINICAL DATA:  80 year old female with small bowel obstruction. EXAM: ABDOMEN - 1 VIEW COMPARISON:  CT Abdomen and Pelvis 05/17/2020. Postcontrast abdominal radiographs 05/19/2020. FINDINGS: Portable AP supine views since 05/19/2020 the oral contrast has become dilute throughout the abdomen and pelvis. Still, faint contrast is visible within a dilated left abdominal loop seen previously (arrow). And  there is no convincing large bowel contrast. Stable enteric tube visible in the epigastrium. Stable abdominal and pelvic visceral contours. Stable visualized osseous structures. At 1313 hours. IMPRESSION: 1. Persistent small bowel obstruction, with no evidence of improvement since 05/19/2020. 2. Stable enteric tube. Electronically Signed   By: Genevie Ann M.D.   On: 05/21/2020 13:31   CT CHEST W CONTRAST  Result Date: 05/17/2020 CLINICAL DATA:  May 17, 2020, abdominal CT. FINDINGS: Cardiovascular: Atheromatous plaque in the thoracic aorta. No aneurysmal dilation. Noncalcified plaque as well. Normal heart size. Three-vessel coronary artery disease. Central pulmonary vasculature unremarkable on venous phase  assessment. Mediastinum/Nodes: Heterogeneous and enlarged thyroid gland, multinodular changes. No axillary lymphadenopathy. Enlarged peribronchial lymph node on image 69 of series 3 a 13 mm. Borderline enlarged LEFT infrahilar lymph node at 9 mm on image 71 of series 3. Subcarinal lymph node at 14 mm on image 61 of series 3. No additional mediastinal or hilar nodal enlargement. Esophagus mildly patulous. Lungs/Pleura: Pulmonary mass measuring 3.5 x 2.8 cm on image 94 of series 4 with spiculated margins contacting the visceral pleura in the LEFT lower lobe, situated in the medial aspect of the LEFT lower lobe. Direct extension of the mass versus is infra hilar lymph node on image 88 of series 4 measuring 13 mm. Bronchial narrowing of secondary bronchial structures at the level of the mass beneath the LEFT hilum. Airways are otherwise patent. Tiny nodule in the RIGHT chest on image 75 series 4 approximately 2-3 mm. Upper Abdomen: Incidental imaging of upper abdominal contents again with small amount of perisplenic fluid and marked small bowel dilation. Visible adrenal glands are normal. There is excreted contrast within the kidneys partially visualized. Musculoskeletal: No acute musculoskeletal finding. No destructive bone lesion. Spinal degenerative changes. IMPRESSION: 1. LEFT lower lobe mass with LEFT hilar and mediastinal adenopathy most suggestive of bronchogenic carcinoma. Referral to multi disciplinary thoracic oncologic setting with PET or biopsy for further assessment is suggested. 2. Tiny 2-3 mm nodule in the RIGHT chest on image 75 series 4. Attention on follow-up. 3. Signs of bowel obstruction and fluid in the upper abdomen about the spleen partially imaged. 4. Enlarged and heterogeneous thyroid. Recommend thyroid ultrasound (ref: J Am Coll Radiol. 2015 Feb;12(2): 143-50). 5. Three-vessel coronary artery disease. 6. Aortic atherosclerosis Aortic Atherosclerosis (ICD10-I70.0). Electronically Signed   By:  Zetta Bills M.D.   On: 05/17/2020 21:21   CT ABDOMEN PELVIS W CONTRAST  Result Date: 05/17/2020 CLINICAL DATA:  Abdominal pain. Vomiting. Possible bowel obstruction on radiograph. EXAM: CT ABDOMEN AND PELVIS WITH CONTRAST TECHNIQUE: Multidetector CT imaging of the abdomen and pelvis was performed using the standard protocol following bolus administration of intravenous contrast. CONTRAST:  151mL OMNIPAQUE IOHEXOL 300 MG/ML  SOLN COMPARISON:  Radiographs earlier today.  CT 02/04/2017 FINDINGS: Lower chest: Lobulated 2.5 cm left lower lobe pulmonary nodule with irregular margins, increased in size from prior exam. No pleural fluid or acute airspace disease. Hepatobiliary: Mild hepatic steatosis. No focal hepatic lesion. Gallbladder physiologically distended, no calcified stone. No biliary dilatation. Pancreas: Parenchymal atrophy. No ductal dilatation or inflammation. Spleen: Normal in size without focal abnormality. Adrenals/Urinary Tract: Normal adrenal glands. No hydronephrosis or perinephric edema. Homogeneous renal enhancement with symmetric excretion on delayed phase imaging. Tiny hypodensity in the upper left kidney is too small to accurately characterize. Urinary bladder is physiologically distended without wall thickening. Slight cystocele. Stomach/Bowel: Fluid-filled distended gastric cardia. Dilated fluid-filled proximal small bowel. Transition from dilated to nondilated in the mid central  abdomen, series 4, image 51. More distal small bowel is decompressed. Mild mesenteric edema and small free fluid. No pneumatosis. The appendix is not confidently visualized on the current exam. Majority of the colon is decompressed. Occasional descending and sigmoid diverticula without diverticulitis. There is pelvic floor descent with small-bowel loops coursing posterior to the bladder. Vascular/Lymphatic: No acute vascular findings are evidence of vascular compromise. Aortic atherosclerosis. Patent portal vein.  Patent mesenteric vessels. No enlarged lymph nodes in the abdomen or pelvis. Reproductive: Status post hysterectomy. No adnexal masses. Other: Small volume of non organized free fluid in both upper quadrants, scattered throughout the mesentery and in the pelvis. No free air or perforation. No focal fluid collection/abscess. Small fat containing upper ventral abdominal wall hernia. Musculoskeletal: Diffuse lumbar degenerative change. No acute osseous abnormality or focal osseous lesion. Scattered subcutaneous granuloma in both gluteal regions/flanks. IMPRESSION: 1. Small bowel obstruction with transition point in the mid central abdomen, likely due to adhesions. Small volume of non organized free fluid in the abdomen and pelvis. No perforation or evidence of ischemia. 2. Lobulated 2.5 cm left lower lobe pulmonary nodule with irregular margins, increased in size from prior exam. This is suspicious for primary bronchogenic malignancy. Recommend dedicated chest CT (with IV contrast) for complete evaluation of the thorax. 3. Mild hepatic steatosis. 4. Pelvic floor descent with small-bowel loops coursing posterior to the bladder. Aortic Atherosclerosis (ICD10-I70.0). Electronically Signed   By: Keith Rake M.D.   On: 05/17/2020 19:40   DG ABD ACUTE 2+V W 1V CHEST  Result Date: 05/17/2020 CLINICAL DATA:  Chronic upper abdominal pain EXAM: DG ABDOMEN ACUTE WITH 1 VIEW CHEST COMPARISON:  08/15/2017 FINDINGS: Supine and upright frontal views of the abdomen and pelvis as well as an upright frontal view of the chest are obtained. Cardiac silhouette is stable. No airspace disease, effusion, or pneumothorax. There is a dilated loop of distended gas-filled small bowel within the left upper quadrant, measuring up to 5.2 cm in diameter. Gas fluid level seen within the left upper quadrant. There is a paucity of distal small bowel gas and colonic gas. No free gas in the greater peritoneal sac. No masses or abnormal  calcifications. IMPRESSION: 1. Findings compatible with high-grade small bowel obstruction. 2. No acute intrathoracic process. Electronically Signed   By: Randa Ngo M.D.   On: 05/17/2020 16:24   DG Abd Portable 1V  Result Date: 05/23/2020 CLINICAL DATA:  SBO EXAM: PORTABLE ABDOMEN - 1 VIEW COMPARISON:  05/22/2020 and prior. FINDINGS: Dilated right hemiabdomen small bowel loop measuring up to 5.3 cm. No evidence of pneumoperitoneum. Multilevel spondylosis. Bilateral body wall calcified granulomas. IMPRESSION: Dilated right hemiabdomen small bowel loop measuring up to 5.3 cm. Electronically Signed   By: Primitivo Gauze M.D.   On: 05/23/2020 08:43   DG Abd Portable 1V  Result Date: 05/22/2020 CLINICAL DATA:  Vomiting, abdominal pain EXAM: PORTABLE ABDOMEN - 1 VIEW COMPARISON:  05/21/2020 FINDINGS: NG tube remains in the stomach. Decreasing bowel distension. No organomegaly or free air. Small left pleural effusion noted with left base atelectasis. IMPRESSION: Improving bowel gas pattern with decreasing bowel distention since prior study. NG tube remains in the stomach. Electronically Signed   By: Rolm Baptise M.D.   On: 05/22/2020 11:33   DG Abd Portable 1V-Small Bowel Obstruction Protocol-initial, 8 hr delay  Result Date: 05/19/2020 CLINICAL DATA:  Small-bowel obstruction EXAM: PORTABLE ABDOMEN - 1 VIEW COMPARISON:  05/18/2020 FINDINGS: Two supine frontal views of the abdomen and pelvis are obtained.  Enteric catheter tip and side port project over the gastric fundus. There is excreted contrast within the bladder related to previous CT. Oral contrast is seen within the stomach and duodenal sweep. Dilute contrast is seen filling dilated loops of small bowel within the left mid abdomen. No evidence of distal small bowel or colonic contrast. IMPRESSION: 1. No evidence of colonic contrast. Persistent dilated small bowel consistent with small-bowel obstruction. Electronically Signed   By: Randa Ngo  M.D.   On: 05/19/2020 20:18   DG Abd Portable 1V-Small Bowel Obstruction Protocol-initial, 8 hr delay  Result Date: 05/18/2020 CLINICAL DATA:  Small bowel obstruction EXAM: PORTABLE ABDOMEN - 1 VIEW COMPARISON:  05/17/2020 FINDINGS: Enteric tube has been placed with tip in the left upper quadrant. The proximal side hole projects over the EG junction region, possibly at or just below the EG junction. Advancement is suggested. Persistent gaseous distention of left upper quadrant bowel, similar to prior study. Residual contrast material in the bladder. IMPRESSION: Enteric tube tip is in the left upper quadrant with proximal side hole at or just below the EG junction. Advancement is suggested. Electronically Signed   By: Lucienne Capers M.D.   On: 05/18/2020 17:22   DG Abd Portable 1 View  Result Date: 05/17/2020 CLINICAL DATA:  Nasogastric tube placement EXAM: PORTABLE ABDOMEN - 1 VIEW COMPARISON:  05/17/2020 FINDINGS: Nasogastric tube extends into the upper abdomen with its tip overlying the expected proximal to mid body of the stomach. Visualized lung bases are clear. Dilated loops of gas-filled small bowel are seen within the left upper quadrant suggestive of underlying small-bowel obstruction, however, the mid and inferior abdomen are excluded from view. No gross free intraperitoneal gas within the visualized abdomen. IMPRESSION: Nasogastric tube extends into the proximal to mid body of the stomach. Electronically Signed   By: Fidela Salisbury MD   On: 05/17/2020 23:03   [place] EKG SITE RITE  Result Date: 05/22/2020 If Site Rite image not attached, placement could not be confirmed due to current cardiac rhythm.

## 2020-05-31 NOTE — Progress Notes (Signed)
Calorie Count Note  48 hour calorie count ordered.  Diet: soft Supplements: Boost Breeze po TID, each supplement provides 250 kcal and 9 grams of protein; MVI with minerals daily  Pt much less interactive today in comparison to yesterday's visit. Pt sleeping, did not awaken, but grunted when asked questions. Lunch tray was untouched.   Per general surgery notes, plan for TPN to be d/c tomorrow.   Nutrition-Focused physical exam completed. Findings are no fat depletion, no muscle depletion, and mild edema.   05/30/20 Breakfast: 370 kcals, 18 grams protein Lunch: 30 kcals, 1 gram protein Dinner: 6 kcals, 0 grams protein Supplements: 1 Boost Breeze (250 kcals, 9 grams protein)  Total intake: 656 kcal (36% of minimum estimated needs)  28 grams protein (33% of minimum estimated needs)  05/31/20 Breakfast: 30 kcals, 1 gram protein Lunch: 0 kcals, 0 grams protein Dinner: - Supplements: 1 Boost Breeze (250 kcals, 9 grams protein)  Total intake: 280 kcal (16% of minimum estimated needs)  10 protein (12% of minimum estimated needs)  Nutrition Dx: Inadequate oral intake related to inability to eat as evidenced by NPO status; Progressing- advanced to soft diet on 05/28/20  Goal: Patient will meet greater than or equal to 90% of their needs; progressing   Intervention:   -TPN management per pharmacy -Continue Boost Breeze po TID, each supplement provides 250 kcal and 9 grams of protein -30 ml Prosource Plus TID, each supplement provides 100 kcals and 15 grams protein -Magic cup TID with meals, each supplement provides 290 kcal and 9 grams of protein  Angelica [name], RD, LDN, [place] Registered Dietitian II Certified Diabetes Care and Education Specialist Please refer to [facility] for RD and/or RD on-call/weekend/after hours pager

## 2020-05-31 NOTE — Progress Notes (Signed)
Occupational Therapy Treatment Patient Details Name: Angelica [name] MRN: [id number] DOB: [birth year] Today's Date: 05/31/2020    History of present illness Pt adm with SBO. Also found incidental Covid+. Underwent diagnostic laparoscopy, open lysis of adhesions and enterrorhaphy on 05/23/20. PMH - HTN, SBO, thyroid disease, asthma. Now off Covid precs (confirmed per RN with ID 1/8).   OT comments  Pt limited by nausea this session therefore session limited to transfer training. Per RN pt had recently thrown up but still requesting this OTA to work with pt. Pt able to complete functional stand pivot transfer from recliner> EOB with rollator and MIN A - min guard needing cues for safety awareness to recall need to lock brakes on rollator prior to mobility. Pt would continue to benefit from skilled occupational therapy while admitted and after d/c to address the below listed limitations in order to improve overall functional mobility and facilitate independence with BADL participation. DC plan remains appropriate, will follow acutely per POC.    Follow Up Recommendations  Home health OT;Supervision/Assistance - 24 hour    Equipment Recommendations  3 in 1 bedside commode;Wheelchair (measurements OT);Wheelchair cushion (measurements OT)    Recommendations for Other Services      Precautions / Restrictions Precautions Precautions: Fall Precaution Comments: monitor BP Restrictions Weight Bearing Restrictions: No       Mobility Bed Mobility Overal bed mobility: Needs Assistance Bed Mobility: Sit to Sidelying;Rolling Rolling: Supervision       Sit to sidelying: Min guard General bed mobility comments: min guard for safety to lower trunk back to sidelying, supervision to roll onto pts back  Transfers Overall transfer level: Needs assistance Equipment used: 4-wheeled walker Transfers: Sit to/from [facility] Sit to Stand: Min guard Stand pivot transfers: Min assist        General transfer comment: minguard for sit<>stand from recliner, cues for hand placement and to remember to locate brakes during mobility, MIN A to pivot to bed with rollator    Balance Overall balance assessment: Needs assistance Sitting-balance support: No upper extremity supported;Feet supported Sitting balance-Leahy Scale: Fair Sitting balance - Comments: able to static sit EOB with no UE suport with no LOB   Standing balance support: Bilateral upper extremity supported;During functional activity Standing balance-Leahy Scale: Poor Standing balance comment: required BUE support for transfers                           ADL either performed or assessed with clinical judgement   ADL Overall ADL's : Needs assistance/impaired                         Toilet Transfer: Minimal assistance;RW;Stand-pivot (rollator) Toilet Transfer Details (indicate cue type and reason): simulated via stand pivot transfer from recliner>EOB with rollator and MIN A for balance         Functional mobility during ADLs: Minimal assistance;Rolling walker (stand pivot with rollator) General ADL Comments: session limited by nausea as RN reports pt having just thrown up, session focus on functional transfer training     Vision       Perception     Praxis      Cognition Arousal/Alertness: Awake/alert Behavior During Therapy: WFL for tasks assessed/performed Overall Cognitive Status: Within Functional Limits for tasks assessed  General Comments: internally distracted by pain , verbalizing minimally with OTA        Exercises     Shoulder Instructions       General Comments pt limited by nausea this session    Pertinent Vitals/ Pain       Pain Assessment: Faces Faces Pain Scale: Hurts little more Pain Location: stomach Pain Descriptors / Indicators: Cramping  Home Living                                           Prior Functioning/Environment              Frequency  Min 2X/week        Progress Toward Goals  OT Goals(current goals can now be found in the care plan section)  Progress towards OT goals: Progressing toward goals  Acute Rehab OT Goals Patient Stated Goal: go home OT Goal Formulation: With patient Time For Goal Achievement: 06/08/20 Potential to Achieve Goals: Good  Plan Discharge plan remains appropriate;Frequency remains appropriate    Co-evaluation                 AM-PAC OT "6 Clicks" Daily Activity     Outcome Measure   Help from another person eating meals?: None Help from another person taking care of personal grooming?: A Little Help from another person toileting, which includes using toliet, bedpan, or urinal?: A Little Help from another person bathing (including washing, rinsing, drying)?: A Lot Help from another person to put on and taking off regular upper body clothing?: A Little Help from another person to put on and taking off regular lower body clothing?: A Little 6 Click Score: 18    End of Session Equipment Utilized During Treatment: Other (comment) (rollator)  OT Visit Diagnosis: Muscle weakness (generalized) (M62.81);Unsteadiness on feet (R26.81);Pain   Activity Tolerance Treatment limited secondary to medical complications (Comment);Other (comment) (limited by nausea)   Patient Left with call bell/phone within reach;with bed alarm set   Nurse Communication Mobility status        Time: 3748-2707 OT Time Calculation (min): 10 min  Charges: OT General Charges $OT Visit: 1 Visit OT Treatments $Therapeutic Activity: 8-22 mins  Angelica Alto., COTA/L Acute Rehabilitation Services [phone]    Angelica [name] 05/31/2020, 4:09 PM

## 2020-05-31 NOTE — Progress Notes (Signed)
PT Cancellation Note  Patient Details Name: Angelica [name] MRN: [id number] DOB: 1940-09-08   Cancelled Treatment:    Reason Eval/Treat Not Completed: (P) Medical issues which prohibited therapy;Other (comment) RN defer due to pt multiple episodes of n/v recently. Will continue efforts next day per PT POC as schedule permits.   Lilinoe Acklin M Peyton Spengler 05/31/2020, 4:21 PM

## 2020-05-31 NOTE — Progress Notes (Signed)
8 Days Post-Op    CC: Abdominal pain  Subjective: Eating some.  Didn't like the supplement that she was given yesterday.  Working with therapies  Objective: Vital signs in last 24 hours: Temp:  [98 F (36.7 C)-98.6 F (37 C)] 98.6 F (37 C) (01/11 0442) Pulse Rate:  [75-80] 77 (01/11 0442) Resp:  [16-19] 19 (01/11 0442) BP: (102-123)/(56-67) 123/67 (01/11 0442) SpO2:  [95 %-100 %] 100 % (01/11 0442) Last BM Date: 05/30/20  Intake/Output from previous day: 01/10 0701 - 01/11 0700 In: 1469 [P.O.:770; I.V.:699] Out: -  Intake/Output this shift: No intake/output data recorded.  PE: Abd: soft, appropriately tender, incision c/d/i with staples present, +BS, ND  Lab Results:  Recent Labs    05/30/20 0340  WBC 12.8*  HGB 11.3*  HCT 35.0*  PLT 253    BMET Recent Labs    05/29/20 0055 05/30/20 0340  NA 134* 135  K 4.5 4.0  CL 102 102  CO2 23 24  GLUCOSE 109* 116*  BUN 18 14  CREATININE 0.74 0.77  CALCIUM 8.3* 8.6*   PT/INR No results for input(s): LABPROT, INR in the last 72 hours.  Recent Labs  Lab 05/26/20 0500 05/30/20 0340  AST 18 140*  ALT 17 245*  ALKPHOS 60 107  BILITOT 0.6 0.6  PROT 5.6* 6.0*  ALBUMIN 2.4* 2.5*     Lipase     Component Value Date/Time   LIPASE 24 05/17/2020 1756     Medications: . amLODipine  10 mg Oral Daily  . Chlorhexidine Gluconate Cloth  6 each Topical Daily  . feeding supplement  1 Container Oral TID BM  . guaiFENesin-dextromethorphan  5 mL Oral Once  . insulin aspart  0-9 Units Subcutaneous Q6H  . losartan  100 mg Oral Daily  . mometasone-formoterol  2 puff Inhalation BID  . multivitamin with minerals  1 tablet Oral Daily  . 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  TNA 1/2 rate.  Increase ensure to TID.  Add 48 hrs calorie count to see if we can stop her TNA  POD 8, S/pdiagnostic laparoscopic, open lysis of adhesions, enterrorhaphy1/4/22 Dr. Kieth Brightly for SBO - Bowel function returning, cont soft diet, increase ensure. calorie count pending -DC TNA today - mobilize, PT/OT -pulm toilet -otherwise surgically stable currently  ID -none FEN - soft diet, TPNto stop today VTE -SCDs, lovenox Foley -none Follow up -staple removal, Dr. Kieth Brightly     LOS: 14 days    Henreitta Cea 05/31/2020 Please see Amion

## 2020-05-31 NOTE — Progress Notes (Addendum)
PHARMACY - TOTAL PARENTERAL NUTRITION CONSULT NOTE  Indication: Small bowel obstruction  Patient Measurements: Height: 5\' 5"  (165.1 cm) Weight: 79.6 kg (175 lb 7.8 oz) IBW/kg (Calculated) : 57   Body mass index is 29.2 kg/m.  Assessment:  70 YOF who presented on 12/28 with abd pain, N/V and was found via KUB and CT showed SBO thought to be due to adhesions. The patient previously had a SBO back in 2016, which required surgery to correct. D#6 of NPO, NGT placed 12/28 for decompression, bowel rest, pre-albumin low at 15.6 - pharmacy consulted for TPN management.  Glucose / Insulin: no hx DM - CBGs better controlled on 1/2 rate TPN.  Used 2 units SSI in past 24 hrs. Electrolytes: all WNL on 1/10 Renal: SCr < 1, BUN WNL LFTs / TGs: AST/ALT up 140/245 (likely d/t lack of bowel stimulation), tbili / TG WNL Prealbumin / albumin: pre-albumin improved to 23.9 WNL, albumin 2.5 Intake / Output; MIVF: no UOP documented, pulled out NGT 1/3 d/t agitation, LBM 1/9 GI Imaging:  12/28 Abd CT: SBO likely d/t adhesions, no perf or evidence of ischemia 1/1 Abd X-ray: Persistent SBO w/ no evidence of improvement 1/2 Abd, x-ray - Improving bowel gas pattern with decreasing bowel distention since prior study 1/3 abd XR - dilated hemiabdomen small bowel loop Surgeries / Procedures:  1/3 ex-lap with open LoA and small bowel repair  Central access: PICC placed 05/22/20 TPN start date: 05/22/20  Nutritional Goals (per RD rec on 1/6): kCal: 1800-1950, Protein: 85-95g, Fluid: >/=1.8L/day  Current Nutrition:  TPN Soft diet started 1/8 - consume 10-20% of meals Boost Breeze TID - 1 charted given (1 Ensure Enlive also charted given) Calorie count started 1/10  Plan:  Continue 2-in-1 TPN with Tues/Fri Intralipids due to allergy and to minimize waste during shortage Continue with Clinisol due to Travasol shortage  Continue TPN at 1/2 rate per Surgery (started 1/9) TPN at 35 ml/hr and Intralipids at 20 ml/hr  x 12 hrs on Tues and Fri Electrolytes in TPN: incr Na 145mEq/L, incr K 72mEq/L, Ca 66mEq/L, incr Mag 57mEq/L, Phos 53mmol/L, Cl:Ac 1:1 - increased lytes on 1/10 with reduced TPN rate Daily PO multivitamin Continue sensitive SSI Q6H - D/C if comes off TPN Standard TPN labs on Mon/Thurs and PRN F/U calorie count on 1/12 to possibly stop TPN   Angelica [name], PharmD, BCPS, [place]  05/31/2020, 7:29 AM  ====================  Addendum: D/C TPN post current bag per Surgery Continue TPN at 35 ml/hr until 1800 D/C TPN labs, nursing care orders and SSI  Angelica [name], PharmD, BCPS, [place] 05/31/2020, 11:26 AM

## 2020-06-01 DIAGNOSIS — K56609 Unspecified intestinal obstruction, unspecified as to partial versus complete obstruction: Secondary | ICD-10-CM | POA: Diagnosis not present

## 2020-06-01 LAB — BASIC METABOLIC PANEL
Anion gap: 9 (ref 5–15)
BUN: 27 mg/dL — ABNORMAL HIGH (ref 8–23)
CO2: 23 mmol/L (ref 22–32)
Calcium: 8.3 mg/dL — ABNORMAL LOW (ref 8.9–10.3)
Chloride: 102 mmol/L (ref 98–111)
Creatinine, Ser: 0.94 mg/dL (ref 0.44–1.00)
GFR, Estimated: >60 mL/min (ref >60)
Glucose, Bld: 98 mg/dL (ref 70–99)
Potassium: 4.6 mmol/L (ref 3.5–5.1)
Sodium: 134 mmol/L — ABNORMAL LOW (ref 135–145)

## 2020-06-01 LAB — PHOSPHORUS: Phosphorus: 4.3 mg/dL (ref 2.5–4.6)

## 2020-06-01 LAB — MAGNESIUM: Magnesium: 2 mg/dL (ref 1.7–2.4)

## 2020-06-01 MED ORDER — BOOST / RESOURCE BREEZE PO LIQD CUSTOM
1.0000 | Freq: Three times a day (TID) | ORAL | Status: DC
  Administered 2020-06-02 – 2020-06-03 (×4): 1 via ORAL

## 2020-06-01 MED ORDER — WHITE PETROLATUM EX OINT
TOPICAL_OINTMENT | CUTANEOUS | Status: AC
  Administered 2020-06-01: 0.2
  Filled 2020-06-01: qty 28.35

## 2020-06-01 NOTE — Progress Notes (Signed)
PROGRESS NOTE                                                                                                                                                                                                             Patient Demographics:    Angelica [name], is a 80 y.o. female, DOB - [birth year], BWG:[id number]  Outpatient Primary MD for the patient is [name], MD   Admit date - 05/17/2020   LOS - 68  Chief Complaint  Patient presents with  . Abdominal Pain       Brief Narrative: Patient is a 80 y.o. female with PMHx of HTN, severe persistent asthma (on benralizumab infusion every 8 weeks)-prior history of SBO-who presented to the hospital with abdominal pain, nausea/vomiting-she was found to have SBO and incidental COVID infection.  She was subsequently admitted to the hospitalist service.  COVID-19 vaccinated status: Fully vaccinated including booster.  Significant Events: 12/28>> Admit to [facility] for SBO  Significant studies: 12/28>> CT abdomen/pelvis: SBO with transition point in the mid central abdomen 12/28>> CT chest: Left lower lobe mass with left hilar/mediastinal adenopathy, enlarged/heterogeneous thyroid  COVID-19 medications: Monoclonal antibody: 12/29 x 1  Antibiotics: None  Microbiology data: None  Procedures: 1/3>> diagnostic laparoscopy-open lysis of adhesions   Consults: General surgery  DVT prophylaxis:     Subjective:   Seen and examined.  Complains of mild abdominal pain but no nausea or vomiting.  Passing flatus.  Had bowel movement yesterday.  Tolerating a diet.   Assessment  & Plan :   SBO: Secondary to lesions-s/p laparoscopy and lysis of adhesions-General surgery following and directing care.  She is feeling better.  Due to some abdominal pain, distention and x-ray showing dilatation of colon, diet has been de-escalated to full liquid by general surgery.  General surgery  plans to do calorie count for 1 to 2 days to see if we can stop her TPN before we discharge her.  Continues to be on TPN.    COVID-19 infection: Asymptomatic-s/p MAB infusion-apart from close monitoring-no other therapies indicated at this point.  Last day of isolation on 1/6-following which she no longer requires any further isolation.  HTN: Blood pressure better controlled.  Continue home dose of amlodipine and losartan.  GERD: On PPI  Left lung mass: Seen incidentally on imaging studies-will need pulmonary evaluation-bronchoscopy/biopsy-once  she completely recovers from her acute illness. My colleague hospitalist discussed with pulmonology-Dr. Hebert Soho will arrange follow-up in the next 1-2 weeks with his office, and then a subsequent biopsy.  Epic message with patient demographics sent to him as well.  Patient's daughter had prohibited hospitalist from breaking the news as she wanted to do it herself over the weekend.  I discussed this topic with her today and she has yet not discussed that with her mother and she is requesting that we break the news when she is closer to discharge.  Enlarged/heterogeneous thyroid gland: Seen incidentally on CT chest-we will need outpatient thyroid ultrasound.  History of severe persistent asthma: Stable-continue Symbicort-on as needed albuterol infusion.  Follows with allergy/immunology and is on on benralizumab infusion every 8 weeks. Resume montelukast. She takes as needed nightly.   ABG:    Component Value Date/Time   TCO2 25 08/22/2016 1818    Vent Settings: N/A    Condition -Stable  Family Communication  :  [phone] updated over the phone 05/29/2020.  Code Status :  Full Code  Diet :  Diet Order            Diet full liquid Room service appropriate? Yes; Fluid consistency: Thin  Diet effective now                  Disposition Plan  :   Status is: Inpatient  Remains inpatient appropriate because:Inpatient level of  care appropriate due to severity of illness   Dispo: The patient is from: Home              Anticipated d/c is to: Home with home health              Anticipated d/c date is: 1 to 2 days              Patient currently is not medically stable to d/c.  Barriers to discharge: SBO-s/p laparoscopic lysis of adhesions-awaiting return of bowel function/advancing diet.  Antimicorbials  :    Anti-infectives (From admission, onward)   Start     Dose/Rate Route Frequency Ordered Stop   05/23/20 1300  ceFAZolin (ANCEF) IVPB 2g/100 mL premix        2 g 200 mL/hr over 30 Minutes Intravenous On call to O.R. 05/23/20 1107 05/23/20 1448      Inpatient Medications  Scheduled Meds: . (feeding supplement) PROSource Plus  30 mL Oral BID BM  . amLODipine  10 mg Oral Daily  . Chlorhexidine Gluconate Cloth  6 each Topical Daily  . feeding supplement  1 Container Oral TID BM  . guaiFENesin-dextromethorphan  5 mL Oral Once  . losartan  100 mg Oral Daily  . mometasone-formoterol  2 puff Inhalation BID  . multivitamin with minerals  1 tablet Oral Daily  . pantoprazole  40 mg Oral QHS   Continuous Infusions: . methocarbamol (ROBAXIN) IV 500 mg (05/26/20 2305)   PRN Meds:.acetaminophen, albuterol, EPINEPHrine, fentaNYL (SUBLIMAZE) injection, haloperidol lactate, labetalol, methocarbamol (ROBAXIN) IV, montelukast, ondansetron **OR** ondansetron (ZOFRAN) IV, promethazine, simethicone, sodium chloride flush   Time Spent in minutes 25  See all Orders from today for further details   Darliss Cheney M.D on 06/01/2020 at 10:36 AM  To page go to [email or web address] - use universal password  [facility] -  Office  [phone]    Objective:   Vitals:   05/31/20 2010 05/31/20 2021 06/01/20 0432 06/01/20 0816  BP: 110/69  100/66   Pulse: 85  81  Resp: 20  20   Temp: 98.2 F (36.8 C)  97.6 F (36.4 C)   TempSrc:   Oral   SpO2: 99% 98% 98% 95%  Weight:      Height:        Wt Readings from Last  3 Encounters:  05/19/20 79.6 kg  05/17/20 81.6 kg  05/03/20 82.6 kg     Intake/Output Summary (Last 24 hours) at 06/01/2020 1036 Last data filed at 05/31/2020 1228 Gross per 24 hour  Intake --  Output 275 ml  Net -275 ml     Physical Exam  General exam: Appears calm and comfortable  Respiratory system: Clear to auscultation. Respiratory effort normal. Cardiovascular system: S1 & S2 heard, RRR. No JVD, murmurs, rubs, gallops or clicks. No pedal edema. Gastrointestinal system: Abdomen is nondistended, soft and mild periumbilical tenderness. No organomegaly or masses felt. Normal bowel sounds heard. Central nervous system: Alert and oriented. No focal neurological deficits. Extremities: Symmetric 5 x 5 power. Skin: No rashes, lesions or ulcers.      Data Review:    CBC Recent Labs  Lab 05/26/20 0500 05/30/20 0340  WBC 10.9* 12.8*  HGB 11.0* 11.3*  HCT 32.9* 35.0*  PLT 230 253  MCV 91.1 91.4  MCH 30.5 29.5  MCHC 33.4 32.3  RDW 13.4 13.5  LYMPHSABS  --  2.5  MONOABS  --  1.3*  EOSABS  --  0.0  BASOSABS  --  0.0    Chemistries  Recent Labs  Lab 05/26/20 0500 05/27/20 0424 05/29/20 0055 05/30/20 0340 06/01/20 0414  NA 135 135 134* 135 134*  K 4.3 4.5 4.5 4.0 4.6  CL 103 101 102 102 102  CO2 24 26 23 24 23   GLUCOSE 172* 146* 109* 116* 98  BUN 15 14 18 14  27*  CREATININE 0.65 0.73 0.74 0.77 0.94  CALCIUM 8.3* 8.6* 8.3* 8.6* 8.3*  MG 1.7 2.0  --  1.9 2.0  AST 18  --   --  140*  --   ALT 17  --   --  245*  --   ALKPHOS 60  --   --  107  --   BILITOT 0.6  --   --  0.6  --    ------------------------------------------------------------------------------------------------------------------ Recent Labs    05/30/20 0340  TRIG 99    Lab Results  Component Value Date   HGBA1C 5.8 05/03/2020   ------------------------------------------------------------------------------------------------------------------ No results for input(s): TSH, T4TOTAL, T3FREE,  THYROIDAB in the last 72 hours.  Invalid input(s): FREET3 ------------------------------------------------------------------------------------------------------------------ No results for input(s): VITAMINB12, FOLATE, FERRITIN, TIBC, IRON, RETICCTPCT in the last 72 hours.  Coagulation profile No results for input(s): INR, PROTIME in the last 168 hours.  No results for input(s): DDIMER in the last 72 hours.  Cardiac Enzymes No results for input(s): CKMB, TROPONINI, MYOGLOBIN in the last 168 hours.  Invalid input(s): CK ------------------------------------------------------------------------------------------------------------------ No results found for: BNP  Micro Results No results found for this or any previous visit (from the past 240 hour(s)).  Radiology Reports DG Abd 1 View  Result Date: 05/21/2020 CLINICAL DATA:  80 year old female with small bowel obstruction. EXAM: ABDOMEN - 1 VIEW COMPARISON:  CT Abdomen and Pelvis 05/17/2020. Postcontrast abdominal radiographs 05/19/2020. FINDINGS: Portable AP supine views since 05/19/2020 the oral contrast has become dilute throughout the abdomen and pelvis. Still, faint contrast is visible within a dilated left abdominal loop seen previously (arrow). And there is no convincing large bowel contrast. Stable enteric tube visible in the  epigastrium. Stable abdominal and pelvic visceral contours. Stable visualized osseous structures. At 1313 hours. IMPRESSION: 1. Persistent small bowel obstruction, with no evidence of improvement since 05/19/2020. 2. Stable enteric tube. Electronically Signed   By: Genevie Ann M.D.   On: 05/21/2020 13:31   CT CHEST W CONTRAST  Result Date: 05/17/2020 CLINICAL DATA:  May 17, 2020, abdominal CT. FINDINGS: Cardiovascular: Atheromatous plaque in the thoracic aorta. No aneurysmal dilation. Noncalcified plaque as well. Normal heart size. Three-vessel coronary artery disease. Central pulmonary vasculature unremarkable on  venous phase assessment. Mediastinum/Nodes: Heterogeneous and enlarged thyroid gland, multinodular changes. No axillary lymphadenopathy. Enlarged peribronchial lymph node on image 69 of series 3 a 13 mm. Borderline enlarged LEFT infrahilar lymph node at 9 mm on image 71 of series 3. Subcarinal lymph node at 14 mm on image 61 of series 3. No additional mediastinal or hilar nodal enlargement. Esophagus mildly patulous. Lungs/Pleura: Pulmonary mass measuring 3.5 x 2.8 cm on image 94 of series 4 with spiculated margins contacting the visceral pleura in the LEFT lower lobe, situated in the medial aspect of the LEFT lower lobe. Direct extension of the mass versus is infra hilar lymph node on image 88 of series 4 measuring 13 mm. Bronchial narrowing of secondary bronchial structures at the level of the mass beneath the LEFT hilum. Airways are otherwise patent. Tiny nodule in the RIGHT chest on image 75 series 4 approximately 2-3 mm. Upper Abdomen: Incidental imaging of upper abdominal contents again with small amount of perisplenic fluid and marked small bowel dilation. Visible adrenal glands are normal. There is excreted contrast within the kidneys partially visualized. Musculoskeletal: No acute musculoskeletal finding. No destructive bone lesion. Spinal degenerative changes. IMPRESSION: 1. LEFT lower lobe mass with LEFT hilar and mediastinal adenopathy most suggestive of bronchogenic carcinoma. Referral to multi disciplinary thoracic oncologic setting with PET or biopsy for further assessment is suggested. 2. Tiny 2-3 mm nodule in the RIGHT chest on image 75 series 4. Attention on follow-up. 3. Signs of bowel obstruction and fluid in the upper abdomen about the spleen partially imaged. 4. Enlarged and heterogeneous thyroid. Recommend thyroid ultrasound (ref: J Am Coll Radiol. 2015 Feb;12(2): 143-50). 5. Three-vessel coronary artery disease. 6. Aortic atherosclerosis Aortic Atherosclerosis (ICD10-I70.0). Electronically  Signed   By: Zetta Bills M.D.   On: 05/17/2020 21:21   CT ABDOMEN PELVIS W CONTRAST  Result Date: 05/17/2020 CLINICAL DATA:  Abdominal pain. Vomiting. Possible bowel obstruction on radiograph. EXAM: CT ABDOMEN AND PELVIS WITH CONTRAST TECHNIQUE: Multidetector CT imaging of the abdomen and pelvis was performed using the standard protocol following bolus administration of intravenous contrast. CONTRAST:  133mL OMNIPAQUE IOHEXOL 300 MG/ML  SOLN COMPARISON:  Radiographs earlier today.  CT 02/04/2017 FINDINGS: Lower chest: Lobulated 2.5 cm left lower lobe pulmonary nodule with irregular margins, increased in size from prior exam. No pleural fluid or acute airspace disease. Hepatobiliary: Mild hepatic steatosis. No focal hepatic lesion. Gallbladder physiologically distended, no calcified stone. No biliary dilatation. Pancreas: Parenchymal atrophy. No ductal dilatation or inflammation. Spleen: Normal in size without focal abnormality. Adrenals/Urinary Tract: Normal adrenal glands. No hydronephrosis or perinephric edema. Homogeneous renal enhancement with symmetric excretion on delayed phase imaging. Tiny hypodensity in the upper left kidney is too small to accurately characterize. Urinary bladder is physiologically distended without wall thickening. Slight cystocele. Stomach/Bowel: Fluid-filled distended gastric cardia. Dilated fluid-filled proximal small bowel. Transition from dilated to nondilated in the mid central abdomen, series 4, image 51. More distal small bowel is decompressed. Mild mesenteric  edema and small free fluid. No pneumatosis. The appendix is not confidently visualized on the current exam. Majority of the colon is decompressed. Occasional descending and sigmoid diverticula without diverticulitis. There is pelvic floor descent with small-bowel loops coursing posterior to the bladder. Vascular/Lymphatic: No acute vascular findings are evidence of vascular compromise. Aortic atherosclerosis. Patent  portal vein. Patent mesenteric vessels. No enlarged lymph nodes in the abdomen or pelvis. Reproductive: Status post hysterectomy. No adnexal masses. Other: Small volume of non organized free fluid in both upper quadrants, scattered throughout the mesentery and in the pelvis. No free air or perforation. No focal fluid collection/abscess. Small fat containing upper ventral abdominal wall hernia. Musculoskeletal: Diffuse lumbar degenerative change. No acute osseous abnormality or focal osseous lesion. Scattered subcutaneous granuloma in both gluteal regions/flanks. IMPRESSION: 1. Small bowel obstruction with transition point in the mid central abdomen, likely due to adhesions. Small volume of non organized free fluid in the abdomen and pelvis. No perforation or evidence of ischemia. 2. Lobulated 2.5 cm left lower lobe pulmonary nodule with irregular margins, increased in size from prior exam. This is suspicious for primary bronchogenic malignancy. Recommend dedicated chest CT (with IV contrast) for complete evaluation of the thorax. 3. Mild hepatic steatosis. 4. Pelvic floor descent with small-bowel loops coursing posterior to the bladder. Aortic Atherosclerosis (ICD10-I70.0). Electronically Signed   By: Keith Rake M.D.   On: 05/17/2020 19:40   DG ABD ACUTE 2+V W 1V CHEST  Result Date: 05/17/2020 CLINICAL DATA:  Chronic upper abdominal pain EXAM: DG ABDOMEN ACUTE WITH 1 VIEW CHEST COMPARISON:  08/15/2017 FINDINGS: Supine and upright frontal views of the abdomen and pelvis as well as an upright frontal view of the chest are obtained. Cardiac silhouette is stable. No airspace disease, effusion, or pneumothorax. There is a dilated loop of distended gas-filled small bowel within the left upper quadrant, measuring up to 5.2 cm in diameter. Gas fluid level seen within the left upper quadrant. There is a paucity of distal small bowel gas and colonic gas. No free gas in the greater peritoneal sac. No masses or  abnormal calcifications. IMPRESSION: 1. Findings compatible with high-grade small bowel obstruction. 2. No acute intrathoracic process. Electronically Signed   By: Randa Ngo M.D.   On: 05/17/2020 16:24   DG Abd Portable 1V  Result Date: 05/31/2020 CLINICAL DATA:  Postop state. EXAM: PORTABLE ABDOMEN - 1 VIEW COMPARISON:  05/23/2020. FINDINGS: Surgical staples noted over the abdomen. Gastric distention. Distention of multiple loops of small bowel noted on today's exam. Single large loop of dilated small bowel in the right upper abdomen is less dilated on today's exam. Colon is nondistended. No free air identified. Degenerative change thoracolumbar spine. IMPRESSION: 1. Gastric distention. 2. Distention of multiple loops of small bowel noted on today's exam. Single large loop of dilated small bowel in the right upper abdomen is less dilated on today's exam. Colon is nondistended. No free air identified. Continued follow-up exam suggested to demonstrate resolution of gastric and small bowel distention. Electronically Signed   By: Marcello Moores  Register   On: 05/31/2020 14:57   DG Abd Portable 1V  Result Date: 05/23/2020 CLINICAL DATA:  SBO EXAM: PORTABLE ABDOMEN - 1 VIEW COMPARISON:  05/22/2020 and prior. FINDINGS: Dilated right hemiabdomen small bowel loop measuring up to 5.3 cm. No evidence of pneumoperitoneum. Multilevel spondylosis. Bilateral body wall calcified granulomas. IMPRESSION: Dilated right hemiabdomen small bowel loop measuring up to 5.3 cm. Electronically Signed   By: Milus Mallick.D.  On: 05/23/2020 08:43   DG Abd Portable 1V  Result Date: 05/22/2020 CLINICAL DATA:  Vomiting, abdominal pain EXAM: PORTABLE ABDOMEN - 1 VIEW COMPARISON:  05/21/2020 FINDINGS: NG tube remains in the stomach. Decreasing bowel distension. No organomegaly or free air. Small left pleural effusion noted with left base atelectasis. IMPRESSION: Improving bowel gas pattern with decreasing bowel distention since  prior study. NG tube remains in the stomach. Electronically Signed   By: Rolm Baptise M.D.   On: 05/22/2020 11:33   DG Abd Portable 1V-Small Bowel Obstruction Protocol-initial, 8 hr delay  Result Date: 05/19/2020 CLINICAL DATA:  Small-bowel obstruction EXAM: PORTABLE ABDOMEN - 1 VIEW COMPARISON:  05/18/2020 FINDINGS: Two supine frontal views of the abdomen and pelvis are obtained. Enteric catheter tip and side port project over the gastric fundus. There is excreted contrast within the bladder related to previous CT. Oral contrast is seen within the stomach and duodenal sweep. Dilute contrast is seen filling dilated loops of small bowel within the left mid abdomen. No evidence of distal small bowel or colonic contrast. IMPRESSION: 1. No evidence of colonic contrast. Persistent dilated small bowel consistent with small-bowel obstruction. Electronically Signed   By: Randa Ngo M.D.   On: 05/19/2020 20:18   DG Abd Portable 1V-Small Bowel Obstruction Protocol-initial, 8 hr delay  Result Date: 05/18/2020 CLINICAL DATA:  Small bowel obstruction EXAM: PORTABLE ABDOMEN - 1 VIEW COMPARISON:  05/17/2020 FINDINGS: Enteric tube has been placed with tip in the left upper quadrant. The proximal side hole projects over the EG junction region, possibly at or just below the EG junction. Advancement is suggested. Persistent gaseous distention of left upper quadrant bowel, similar to prior study. Residual contrast material in the bladder. IMPRESSION: Enteric tube tip is in the left upper quadrant with proximal side hole at or just below the EG junction. Advancement is suggested. Electronically Signed   By: Lucienne Capers M.D.   On: 05/18/2020 17:22   DG Abd Portable 1 View  Result Date: 05/17/2020 CLINICAL DATA:  Nasogastric tube placement EXAM: PORTABLE ABDOMEN - 1 VIEW COMPARISON:  05/17/2020 FINDINGS: Nasogastric tube extends into the upper abdomen with its tip overlying the expected proximal to mid body of the  stomach. Visualized lung bases are clear. Dilated loops of gas-filled small bowel are seen within the left upper quadrant suggestive of underlying small-bowel obstruction, however, the mid and inferior abdomen are excluded from view. No gross free intraperitoneal gas within the visualized abdomen. IMPRESSION: Nasogastric tube extends into the proximal to mid body of the stomach. Electronically Signed   By: Fidela Salisbury MD   On: 05/17/2020 23:03   [place] EKG SITE RITE  Result Date: 05/22/2020 If Site Rite image not attached, placement could not be confirmed due to current cardiac rhythm.

## 2020-06-01 NOTE — Progress Notes (Signed)
Pt had 2 small bowel movements this afternoon

## 2020-06-01 NOTE — Progress Notes (Signed)
Physical Therapy Treatment Patient Details Name: Angelica [name] MRN: [id number] DOB: 1940-05-30 Today's Date: 06/01/2020    History of Present Illness Pt adm with SBO. Also found incidental Covid+. Underwent diagnostic laparoscopy, open lysis of adhesions and enterrorhaphy on 05/23/20. PMH - HTN, SBO, thyroid disease, asthma. Now off Covid precs (confirmed per RN with ID 1/8). Soft diet 1/11. Due to some abdominal pain, distention and x-ray showing dilatation of colon, diet has been de-escalated to full liquid by general surgery 1/12.    PT Comments    Pt supine on arrival, agreeable to therapy session and with good participation and tolerance for mobility. Pt mainly limited due to report of increased abdominal pressure t/o session, however pt able to have BM after gait trial and reports slight improvement to bloating, RN notified. Pt progressed gait distance to 73ft using rollator with min guard at most and report of slight dizziness toward end of gait trial, BP 103/68 after gait seated in chair, HR 85 bpm and SpO2 100% on RA. Pt agreeable to sitting up in chair ~1hr at end of session. Pt continues to benefit from PT services to progress toward functional mobility goals. D/C recs below remain appropriate.   Follow Up Recommendations  Home health PT;Supervision/Assistance - 24 hour     Equipment Recommendations  None recommended by PT    Recommendations for Other Services       Precautions / Restrictions Precautions Precautions: Fall Precaution Comments: monitor BP Restrictions Weight Bearing Restrictions: No    Mobility  Bed Mobility Overal bed mobility: Needs Assistance Bed Mobility: Rolling;Sidelying to Sit Rolling: Min guard Sidelying to sit: Min assist       General bed mobility comments: min guard to roll to pts L side needing tactile cues for hand placement on bed rails, MIN A to elevate trunk into sitting from sidelying.  Transfers Overall transfer level: Needs  assistance Equipment used: 4-wheeled walker Transfers: Sit to/from Stand Sit to Stand: Min guard         General transfer comment: min guard from EOB/toilet heights to rollator, cues needed each transfer for use of brakes/locks prior to standing, poor carryover of cues  Ambulation/Gait Ambulation/Gait assistance: Min guard (chair follow) Gait Distance (Feet): 80 Feet Assistive device: 4-wheeled walker Gait Pattern/deviations: Step-through pattern;Decreased stride length;Trunk flexed Gait velocity: decr   General Gait Details: short step-through gait pattern, cues for posture and proximity to 4WW needed at times. mild dizziness reports but UTA due to no dinamap nearby during gait trial; once seated in chair, BP 103/68.   Stairs             Wheelchair Mobility    Modified Rankin (Stroke Patients Only)       Balance Overall balance assessment: Needs assistance Sitting-balance support: No upper extremity supported;Feet supported Sitting balance-Leahy Scale: Fair     Standing balance support: Bilateral upper extremity supported;During functional activity Standing balance-Leahy Scale: Poor Standing balance comment: reliant on RW for dynamic tasks but able to don mask with BUE unsupported while static standing no LOB                            Cognition Arousal/Alertness: Awake/alert Behavior During Therapy: WFL for tasks assessed/performed Overall Cognitive Status: Within Functional Limits for tasks assessed Area of Impairment: Following commands;Problem solving                     Memory: Decreased short-term memory  Following Commands: Follows one step commands consistently;Follows one step commands with increased time Safety/Judgement: Decreased awareness of deficits   Problem Solving: Slow processing;Requires verbal cues General Comments: improved alertness and fair safety awareness this session, poor carryover of cues for rollator use of  brakes pre/post transfers      Exercises      General Comments General comments (skin integrity, edema, etc.): pt denies nausea but reports increased abdominal pressure, pt also had BM after gait trial and reported slight improvement in bloating, RN notified.      Pertinent Vitals/Pain Pain Assessment: Faces Faces Pain Scale: Hurts even more Pain Location: stomach Pain Descriptors / Indicators: Pressure;Discomfort Pain Intervention(s): Monitored during session;Repositioned;Limited activity within patient's tolerance    Home Living                      Prior Function            PT Goals (current goals can now be found in the care plan section) Acute Rehab PT Goals Patient Stated Goal: go home PT Goal Formulation: With patient Time For Goal Achievement: 06/07/20 Potential to Achieve Goals: Good Progress towards PT goals: Progressing toward goals    Frequency    Min 3X/week      PT Plan Current plan remains appropriate    Co-evaluation              AM-PAC PT "6 Clicks" Mobility   Outcome Measure  Help needed turning from your back to your side while in a flat bed without using bedrails?: A Little Help needed moving from lying on your back to sitting on the side of a flat bed without using bedrails?: A Little Help needed moving to and from a bed to a chair (including a wheelchair)?: A Little Help needed standing up from a chair using your arms (e.g., wheelchair or bedside chair)?: A Little Help needed to walk in hospital room?: A Little Help needed climbing 3-5 steps with a railing? : A Lot 6 Click Score: 17    End of Session Equipment Utilized During Treatment: Gait belt Activity Tolerance: Patient tolerated treatment well Patient left: in chair;with call bell/phone within reach;with chair alarm set Nurse Communication: Mobility status PT Visit Diagnosis: Other abnormalities of gait and mobility (R26.89);Pain Pain - part of body:  (abdomen)      Time: 0814-4818 PT Time Calculation (min) (ACUTE ONLY): 29 min  Charges:  $Gait Training: 8-22 mins $Therapeutic Activity: 8-22 mins                     Hayleen Clinkscales P., PTA Acute Rehabilitation Services Pager: [phone] Office: [place] 06/01/2020, 11:08 AM

## 2020-06-01 NOTE — Progress Notes (Signed)
9 Days Post-Op    CC: Abdominal pain  Subjective: Had an episode of emesis yesterday. KUB showed mildly dilated small bowel but gas in colon. Patient endorses flatus. Tolerated some clears last night.  Objective: Vital signs in last 24 hours: Temp:  [97.6 F (36.4 C)-98.6 F (37 C)] 97.6 F (36.4 C) (01/12 0432) Pulse Rate:  [81-105] 81 (01/12 0432) Resp:  [20] 20 (01/12 0432) BP: (100-126)/(66-72) 100/66 (01/12 0432) SpO2:  [95 %-99 %] 95 % (01/12 0816) Last BM Date: 05/30/20  Intake/Output from previous day: 01/11 0701 - 01/12 0700 In: -  Out: 275 [Emesis/NG output:275] Intake/Output this shift: No intake/output data recorded.  PE: Abd:  incision c/d/i with staples present, nondistended, nontender  Lab Results:  Recent Labs    05/30/20 0340  WBC 12.8*  HGB 11.3*  HCT 35.0*  PLT 253    BMET Recent Labs    05/30/20 0340 06/01/20 0414  NA 135 134*  K 4.0 4.6  CL 102 102  CO2 24 23  GLUCOSE 116* 98  BUN 14 27*  CREATININE 0.77 0.94  CALCIUM 8.6* 8.3*   PT/INR No results for input(s): LABPROT, INR in the last 72 hours.  Recent Labs  Lab 05/26/20 0500 05/30/20 0340  AST 18 140*  ALT 17 245*  ALKPHOS 60 107  BILITOT 0.6 0.6  PROT 5.6* 6.0*  ALBUMIN 2.4* 2.5*     Lipase     Component Value Date/Time   LIPASE 24 05/17/2020 1756     Medications: . (feeding supplement) PROSource Plus  30 mL Oral BID BM  . amLODipine  10 mg Oral Daily  . Chlorhexidine Gluconate Cloth  6 each Topical Daily  . feeding supplement  1 Container Oral TID BM  . guaiFENesin-dextromethorphan  5 mL Oral Once  . losartan  100 mg Oral Daily  . mometasone-formoterol  2 puff Inhalation BID  . multivitamin with minerals  1 tablet Oral Daily  . 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 Kieth Brightly for SBO - No emesis overnight, patient denies nausea this morning. Try full liquids today with ensure, continue calorie count - mobilize, PT/OT -pulm toilet -otherwise surgically stable currently Follow up -staple removal, Dr. Kieth Brightly     LOS: 15 days   Michaelle Birks, MD [facility] Surgery General, Hepatobiliary and Pancreatic Surgery 06/01/20 8:24 AM

## 2020-06-01 NOTE — Progress Notes (Signed)
Calorie Count Note  48 hour calorie count ordered.  Diet: full liquid Supplements: 30 ml Prosource Plus BID, Boost Breeze po TID, each supplement provides 250 kcal and 9 grams of protein, MVI with minerals daily  Spoke with pt at bedside, who reports that she consumed some clear liquids this morning. She is sipping on gingerale, which is helping with her abdominal tightness. She confirmed she had emesis yesterday, however, no further vomiting since then.   TPN d/c today. Pt reports that she thinks her intake will improve as abdominal pain is improving. She does not like Ensure and does not like peach Boost Breeze supplements. Pt provided with berry flavored Boost Breeze, which she liked. RD encouraged pt to continue supplements (also consumed about 50% of Prosource).   05/30/20 Breakfast:370 kcals, 18 grams protein Lunch: 30 kcals, 1 gram protein Dinner: 6 kcals, 0 grams protein Supplements: 1 Boost Breeze (250 kcals, 9 grams protein)  Total intake: 656 kcal (36% of minimum estimated needs)  28 grams protein (33% of minimum estimated needs)  05/31/20 Breakfast:30 kcals, 1 gram protein Lunch: 0 kcals, 0 grams protein Dinner: 0 kcals, 0 grams protein Supplements: 1 Boost Breeze (250 kcals, 9 grams protein)  Total intake: 280 kcal (16% of minimum estimated needs)  10 protein (12% of minimum estimated needs)  Nutrition Dx: Inadequate oral intakerelated to inability to eatas evidenced by NPO status; Progressing- advanced to soft diet on 05/28/20  Goal: Patient will meet greater than or equal to 90% of their needs; progressing   Intervention:   -D/c calorie count -Continue Boost Breeze po TID, each supplement provides 250 kcal and 9 grams of protein -Continue 30 ml Prosource Plus TID, each supplement provides 100 kcals and 15 grams protein -Continue Magic cup TID with meals, each supplement provides 290 kcal and 9 grams of protein  Loistine Chance, RD, LDN, [place] Registered  Dietitian II Certified Diabetes Care and Education Specialist Please refer to AMION for RD and/or RD on-call/weekend/after hours pager

## 2020-06-02 DIAGNOSIS — K56609 Unspecified intestinal obstruction, unspecified as to partial versus complete obstruction: Secondary | ICD-10-CM | POA: Diagnosis not present

## 2020-06-02 MED ORDER — TRAZODONE HCL 50 MG PO TABS
50.0000 mg | ORAL_TABLET | Freq: Once | ORAL | Status: AC
  Administered 2020-06-02: 50 mg via ORAL
  Filled 2020-06-02: qty 1

## 2020-06-02 NOTE — Discharge Instructions (Signed)
[facility]      [facility] [phone]  OPEN ABDOMINAL SURGERY: POST OP INSTRUCTIONS  Always review your discharge instruction sheet given to you by the facility where your surgery was performed.  IF YOU HAVE DISABILITY OR FAMILY LEAVE FORMS, YOU MUST BRING THEM TO THE OFFICE FOR PROCESSING.  PLEASE DO NOT GIVE THEM TO YOUR DOCTOR.  1. A prescription for pain medication may be given to you upon discharge.  Take your pain medication as prescribed, if needed.  If narcotic pain medicine is not needed, then you may take acetaminophen (Tylenol) or ibuprofen (Advil) as needed. 2. Take your usually prescribed medications unless otherwise directed. 3. If you need a refill on your pain medication, please contact your pharmacy. They will contact our office to request authorization.  Prescriptions will not be filled after 5pm or on week-ends. 4. You should follow a light diet the first few days after arrival home, such as soup and crackers, pudding, etc.unless your doctor has advised otherwise. A high-fiber, low fat diet can be resumed as tolerated.   Be sure to include lots of fluids daily. Most patients will experience some swelling and bruising on the chest and neck area.  Ice packs will help.  Swelling and bruising can take several days to resolve 5. Most patients will experience some swelling and bruising in the area of the incision. Ice pack will help. Swelling and bruising can take several days to resolve..  6. It is common to experience some constipation if taking pain medication after surgery.  Increasing fluid intake and taking a stool softener will usually help or prevent this problem from occurring.  A mild laxative (Milk of Magnesia or Miralax) should be taken according to package directions if there are no bowel movements after 48 hours. 7.  You may have steri-strips (small skin tapes) in place directly over the incision.  These strips should be left on the skin for 7-10 days.  If your  surgeon used skin glue on the incision, you may shower in 24 hours.  The glue will flake off over the next 2-3 weeks.  Any sutures or staples will be removed at the office during your follow-up visit. You may find that a light gauze bandage over your incision may keep your staples from being rubbed or pulled. You may shower and replace the bandage daily. 8. ACTIVITIES:  You may resume regular (light) daily activities beginning the next day--such as daily self-care, walking, climbing stairs--gradually increasing activities as tolerated.  You may have sexual intercourse when it is comfortable.  Refrain from any heavy lifting or straining until approved by your doctor. a. You may drive when you no longer are taking prescription pain medication, you can comfortably wear a seatbelt, and you can safely maneuver your car and apply brakes b. Return to Work: ___________________________________ 9. You should see your doctor in the office for a follow-up appointment approximately two weeks after your surgery.  Make sure that you call for this appointment within a day or two after you arrive home to insure a convenient appointment time. OTHER INSTRUCTIONS:  _____________________________________________________________ _____________________________________________________________  WHEN TO CALL YOUR DOCTOR: 1. Fever over 101.0 2. Inability to urinate 3. Nausea and/or vomiting 4. Extreme swelling or bruising 5. Continued bleeding from incision. 6. Increased pain, redness, or drainage from the incision. 7. Difficulty swallowing or breathing 8. Muscle cramping or spasms. 9. Numbness or tingling in hands or feet or around lips.  The clinic staff is available to   answer your questions during regular business hours.  Please don't hesitate to call and ask to speak to one of the nurses if you have concerns.  For further questions, please visit [email or web address]   

## 2020-06-02 NOTE — Progress Notes (Signed)
Abdomen staples removed. Steri strips and betadine applied. Site unremarkable. Patient tolerated procedure well.

## 2020-06-02 NOTE — Progress Notes (Signed)
10 Days Post-Op    CC: Abdominal pain  Subjective: tolerating full liquids with no further N/V.  Had 2 small BMs yesterday afternoon  Objective: Vital signs in last 24 hours: Temp:  [97.6 F (36.4 C)-97.8 F (36.6 C)] 97.6 F (36.4 C) (01/12 2029) Pulse Rate:  [79-84] 83 (01/13 0844) Resp:  [16-18] 16 (01/13 0844) BP: (115-125)/(65-70) 115/70 (01/12 2029) SpO2:  [95 %-100 %] 95 % (01/13 0844) Last BM Date: 06/01/20  Intake/Output from previous day: 01/12 0701 - 01/13 0700 In: 300 [P.O.:300] Out: -  Intake/Output this shift: No intake/output data recorded.  PE: Abd: soft, appropriately tender, incision c/d/i with staples present, +BS, ND  Lab Results:  No results for input(s): WBC, HGB, HCT, PLT in the last 72 hours.  BMET Recent Labs    06/01/20 0414  NA 134*  K 4.6  CL 102  CO2 23  GLUCOSE 98  BUN 27*  CREATININE 0.94  CALCIUM 8.3*   PT/INR No results for input(s): LABPROT, INR in the last 72 hours.  Recent Labs  Lab 05/30/20 0340  AST 140*  ALT 245*  ALKPHOS 107  BILITOT 0.6  PROT 6.0*  ALBUMIN 2.5*     Lipase     Component Value Date/Time   LIPASE 24 05/17/2020 1756     Medications: . amLODipine  10 mg Oral Daily  . Chlorhexidine Gluconate Cloth  6 each Topical Daily  . feeding supplement  1 Container Oral TID BM  . guaiFENesin-dextromethorphan  5 mL Oral Once  . losartan  100 mg Oral Daily  . mometasone-formoterol  2 puff Inhalation BID  . multivitamin with minerals  1 tablet Oral Daily  . 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  TNA 1/2 rate.  Increase ensure to TID.  Add 48 hrs calorie count to  see if we can stop her TNA  POD 10, S/pdiagnostic laparoscopic, open lysis of adhesions, enterrorhaphy1/4/22 Dr. Kieth Brightly for SBO - Bowel function returning, readvance to soft diet -DC staples today - mobilize, PT/OT -pulm toilet -hopefully able to go home soon as she tolerates solid diet   ID -none FEN - soft diet VTE -SCDs, lovenox Foley -none Follow up -Dr. Kieth Brightly     LOS: 16 days    Angelica [name] 06/02/2020 Please see Amion

## 2020-06-02 NOTE — Plan of Care (Signed)
Patient up to bedside commode with assistance and rolling walker. Soft diet for dinner this evening. Staples removed, tolerated well. Steristrips placed.   Problem: Education: Goal: Knowledge of General Education information will improve Description: Including pain rating scale, medication(s)/side effects and non-pharmacologic comfort measures Outcome: Progressing   Problem: Health Behavior/Discharge Planning: Goal: Ability to manage health-related needs will improve Outcome: Progressing   Problem: Clinical Measurements: Goal: Will remain free from infection Outcome: Progressing Goal: Diagnostic test results will improve Outcome: Progressing   Problem: Activity: Goal: Risk for activity intolerance will decrease Outcome: Progressing   Problem: Nutrition: Goal: Adequate nutrition will be maintained Outcome: Progressing   Problem: Elimination: Goal: Will not experience complications related to bowel motility Outcome: Progressing   Problem: Education: Goal: Knowledge of risk factors and measures for prevention of condition will improve Outcome: Progressing   Problem: Respiratory: Goal: Will maintain a patent airway Outcome: Progressing

## 2020-06-02 NOTE — Progress Notes (Signed)
PROGRESS NOTE                                                                                                                                                                                                             Patient Demographics:    Angelica [name], is a 80 y.o. female, DOB - 1941-03-02, IOX:[id number]  Outpatient Primary MD for the patient is [name], MD   Admit date - 05/17/2020   LOS - 36  Chief Complaint  Patient presents with  . Abdominal Pain       Brief Narrative: Patient is a 80 y.o. female with PMHx of HTN, severe persistent asthma (on benralizumab infusion every 8 weeks)-prior history of SBO-who presented to the hospital with abdominal pain, nausea/vomiting-she was found to have SBO and incidental COVID infection.  She was subsequently admitted to the hospitalist service.  COVID-19 vaccinated status: Fully vaccinated including booster.  Significant Events: 12/28>> Admit to [facility] for SBO  Significant studies: 12/28>> CT abdomen/pelvis: SBO with transition point in the mid central abdomen 12/28>> CT chest: Left lower lobe mass with left hilar/mediastinal adenopathy, enlarged/heterogeneous thyroid  COVID-19 medications: Monoclonal antibody: 12/29 x 1  Antibiotics: None  Microbiology data: None  Procedures: 1/3>> diagnostic laparoscopy-open lysis of adhesions   Consults: General surgery  DVT prophylaxis:     Subjective:   Seen and examined.  No complaints.  Tolerating for liquid diet.  Had 2 bowel movements yesterday.  Passing flatus intermittently.   Assessment  & Plan :   SBO: Secondary to lesions-s/p laparoscopy and lysis of adhesions-General surgery following and directing care.  She is feeling better.  Had 2 bowel movements yesterday.  Tolerating for liquids.  Diet advanced to soft diet per general surgery. Continues to be on TPN.  Further plans and management per general  surgery.  COVID-19 infection: Asymptomatic-s/p MAB infusion-apart from close monitoring-no other therapies indicated at this point.  Last day of isolation on 1/6-following which she no longer requires any further isolation.  HTN: Blood pressure better controlled.  Continue home dose of amlodipine and losartan.  GERD: On PPI  Left lung mass: Seen incidentally on imaging studies-will need pulmonary evaluation-bronchoscopy/biopsy-once she completely recovers from her acute illness. My colleague hospitalist discussed with pulmonology-Dr. Hebert Soho will arrange follow-up in the next 1-2 weeks with his office, and then a subsequent  biopsy.  Epic message with patient demographics sent to him as well.  Patient's daughter had prohibited hospitalist from breaking the news as she wanted to do it herself over the weekend.  I discussed this topic with her today and she has yet not discussed that with her mother and she is requesting that we break the news when she is closer to discharge.  Enlarged/heterogeneous thyroid gland: Seen incidentally on CT chest-we will need outpatient thyroid ultrasound.  History of severe persistent asthma: Stable-continue Symbicort-on as needed albuterol infusion.  Follows with allergy/immunology and is on on benralizumab infusion every 8 weeks. Resume montelukast. She takes as needed nightly.   ABG:    Component Value Date/Time   TCO2 25 08/22/2016 1818    Vent Settings: N/A    Condition -Stable  Family Communication  :  [phone] updated over the phone 05/29/2020.  Code Status :  Full Code  Diet :  Diet Order            DIET SOFT Room service appropriate? Yes; Fluid consistency: Thin  Diet effective now                  Disposition Plan  :   Status is: Inpatient  Remains inpatient appropriate because:Inpatient level of care appropriate due to severity of illness   Dispo: The patient is from: Home              Anticipated d/c is to:  Home with home health              Anticipated d/c date is: 1 to 2 days              Patient currently is not medically stable to d/c.  Barriers to discharge: SBO-s/p laparoscopic lysis of adhesions-awaiting return of bowel function/advancing diet.  Antimicorbials  :    Anti-infectives (From admission, onward)   Start     Dose/Rate Route Frequency Ordered Stop   05/23/20 1300  ceFAZolin (ANCEF) IVPB 2g/100 mL premix        2 g 200 mL/hr over 30 Minutes Intravenous On call to O.R. 05/23/20 1107 05/23/20 1448      Inpatient Medications  Scheduled Meds: . amLODipine  10 mg Oral Daily  . Chlorhexidine Gluconate Cloth  6 each Topical Daily  . feeding supplement  1 Container Oral TID BM  . guaiFENesin-dextromethorphan  5 mL Oral Once  . losartan  100 mg Oral Daily  . mometasone-formoterol  2 puff Inhalation BID  . multivitamin with minerals  1 tablet Oral Daily  . pantoprazole  40 mg Oral QHS   Continuous Infusions: . methocarbamol (ROBAXIN) IV 500 mg (05/26/20 2305)   PRN Meds:.acetaminophen, albuterol, EPINEPHrine, fentaNYL (SUBLIMAZE) injection, haloperidol lactate, labetalol, methocarbamol (ROBAXIN) IV, montelukast, ondansetron **OR** ondansetron (ZOFRAN) IV, promethazine, simethicone, sodium chloride flush   Time Spent in minutes 24  See all Orders from today for further details   Darliss Cheney M.D on 06/02/2020 at 9:44 AM  To page go to [email or web address] - use universal password  [facility] -  Office  [phone]    Objective:   Vitals:   06/01/20 1354 06/01/20 2029 06/01/20 2112 06/02/20 0844  BP: 125/65 115/70    Pulse: 79 84  83  Resp: 18 17  16   Temp: 97.8 F (36.6 C) 97.6 F (36.4 C)    TempSrc: Oral Oral    SpO2: 100% 99% 96% 95%  Weight:      Height:  Wt Readings from Last 3 Encounters:  05/19/20 79.6 kg  05/17/20 81.6 kg  05/03/20 82.6 kg     Intake/Output Summary (Last 24 hours) at 06/02/2020 0944 Last data filed at 06/01/2020  1355 Gross per 24 hour  Intake 300 ml  Output --  Net 300 ml     Physical Exam  General exam: Appears calm and comfortable  Respiratory system: Clear to auscultation. Respiratory effort normal. Cardiovascular system: S1 & S2 heard, RRR. No JVD, murmurs, rubs, gallops or clicks. No pedal edema. Gastrointestinal system: Abdomen is nondistended, soft and slightly tender at the incision site. No organomegaly or masses felt. Normal bowel sounds heard. Central nervous system: Alert and oriented. No focal neurological deficits. Extremities: Symmetric 5 x 5 power. Skin: No rashes, lesions or ulcers.      Data Review:    CBC Recent Labs  Lab 05/30/20 0340  WBC 12.8*  HGB 11.3*  HCT 35.0*  PLT 253  MCV 91.4  MCH 29.5  MCHC 32.3  RDW 13.5  LYMPHSABS 2.5  MONOABS 1.3*  EOSABS 0.0  BASOSABS 0.0    Chemistries  Recent Labs  Lab 05/27/20 0424 05/29/20 0055 05/30/20 0340 06/01/20 0414  NA 135 134* 135 134*  K 4.5 4.5 4.0 4.6  CL 101 102 102 102  CO2 26 23 24 23   GLUCOSE 146* 109* 116* 98  BUN 14 18 14  27*  CREATININE 0.73 0.74 0.77 0.94  CALCIUM 8.6* 8.3* 8.6* 8.3*  MG 2.0  --  1.9 2.0  AST  --   --  140*  --   ALT  --   --  245*  --   ALKPHOS  --   --  107  --   BILITOT  --   --  0.6  --    ------------------------------------------------------------------------------------------------------------------ No results for input(s): CHOL, HDL, LDLCALC, TRIG, CHOLHDL, LDLDIRECT in the last 72 hours.  Lab Results  Component Value Date   HGBA1C 5.8 05/03/2020   ------------------------------------------------------------------------------------------------------------------ No results for input(s): TSH, T4TOTAL, T3FREE, THYROIDAB in the last 72 hours.  Invalid input(s): FREET3 ------------------------------------------------------------------------------------------------------------------ No results for input(s): VITAMINB12, FOLATE, FERRITIN, TIBC, IRON, RETICCTPCT  in the last 72 hours.  Coagulation profile No results for input(s): INR, PROTIME in the last 168 hours.  No results for input(s): DDIMER in the last 72 hours.  Cardiac Enzymes No results for input(s): CKMB, TROPONINI, MYOGLOBIN in the last 168 hours.  Invalid input(s): CK ------------------------------------------------------------------------------------------------------------------ No results found for: BNP  Micro Results No results found for this or any previous visit (from the past 240 hour(s)).  Radiology Reports DG Abd 1 View  Result Date: 05/21/2020 CLINICAL DATA:  80 year old female with small bowel obstruction. EXAM: ABDOMEN - 1 VIEW COMPARISON:  CT Abdomen and Pelvis 05/17/2020. Postcontrast abdominal radiographs 05/19/2020. FINDINGS: Portable AP supine views since 05/19/2020 the oral contrast has become dilute throughout the abdomen and pelvis. Still, faint contrast is visible within a dilated left abdominal loop seen previously (arrow). And there is no convincing large bowel contrast. Stable enteric tube visible in the epigastrium. Stable abdominal and pelvic visceral contours. Stable visualized osseous structures. At 1313 hours. IMPRESSION: 1. Persistent small bowel obstruction, with no evidence of improvement since 05/19/2020. 2. Stable enteric tube. Electronically Signed   By: Genevie Ann M.D.   On: 05/21/2020 13:31   CT CHEST W CONTRAST  Result Date: 05/17/2020 CLINICAL DATA:  May 17, 2020, abdominal CT. FINDINGS: Cardiovascular: Atheromatous plaque in the thoracic aorta. No aneurysmal dilation. Noncalcified  plaque as well. Normal heart size. Three-vessel coronary artery disease. Central pulmonary vasculature unremarkable on venous phase assessment. Mediastinum/Nodes: Heterogeneous and enlarged thyroid gland, multinodular changes. No axillary lymphadenopathy. Enlarged peribronchial lymph node on image 69 of series 3 a 13 mm. Borderline enlarged LEFT infrahilar lymph node at  9 mm on image 71 of series 3. Subcarinal lymph node at 14 mm on image 61 of series 3. No additional mediastinal or hilar nodal enlargement. Esophagus mildly patulous. Lungs/Pleura: Pulmonary mass measuring 3.5 x 2.8 cm on image 94 of series 4 with spiculated margins contacting the visceral pleura in the LEFT lower lobe, situated in the medial aspect of the LEFT lower lobe. Direct extension of the mass versus is infra hilar lymph node on image 88 of series 4 measuring 13 mm. Bronchial narrowing of secondary bronchial structures at the level of the mass beneath the LEFT hilum. Airways are otherwise patent. Tiny nodule in the RIGHT chest on image 75 series 4 approximately 2-3 mm. Upper Abdomen: Incidental imaging of upper abdominal contents again with small amount of perisplenic fluid and marked small bowel dilation. Visible adrenal glands are normal. There is excreted contrast within the kidneys partially visualized. Musculoskeletal: No acute musculoskeletal finding. No destructive bone lesion. Spinal degenerative changes. IMPRESSION: 1. LEFT lower lobe mass with LEFT hilar and mediastinal adenopathy most suggestive of bronchogenic carcinoma. Referral to multi disciplinary thoracic oncologic setting with PET or biopsy for further assessment is suggested. 2. Tiny 2-3 mm nodule in the RIGHT chest on image 75 series 4. Attention on follow-up. 3. Signs of bowel obstruction and fluid in the upper abdomen about the spleen partially imaged. 4. Enlarged and heterogeneous thyroid. Recommend thyroid ultrasound (ref: J Am Coll Radiol. 2015 Feb;12(2): 143-50). 5. Three-vessel coronary artery disease. 6. Aortic atherosclerosis Aortic Atherosclerosis (ICD10-I70.0). Electronically Signed   By: Zetta Bills M.D.   On: 05/17/2020 21:21   CT ABDOMEN PELVIS W CONTRAST  Result Date: 05/17/2020 CLINICAL DATA:  Abdominal pain. Vomiting. Possible bowel obstruction on radiograph. EXAM: CT ABDOMEN AND PELVIS WITH CONTRAST TECHNIQUE:  Multidetector CT imaging of the abdomen and pelvis was performed using the standard protocol following bolus administration of intravenous contrast. CONTRAST:  151mL OMNIPAQUE IOHEXOL 300 MG/ML  SOLN COMPARISON:  Radiographs earlier today.  CT 02/04/2017 FINDINGS: Lower chest: Lobulated 2.5 cm left lower lobe pulmonary nodule with irregular margins, increased in size from prior exam. No pleural fluid or acute airspace disease. Hepatobiliary: Mild hepatic steatosis. No focal hepatic lesion. Gallbladder physiologically distended, no calcified stone. No biliary dilatation. Pancreas: Parenchymal atrophy. No ductal dilatation or inflammation. Spleen: Normal in size without focal abnormality. Adrenals/Urinary Tract: Normal adrenal glands. No hydronephrosis or perinephric edema. Homogeneous renal enhancement with symmetric excretion on delayed phase imaging. Tiny hypodensity in the upper left kidney is too small to accurately characterize. Urinary bladder is physiologically distended without wall thickening. Slight cystocele. Stomach/Bowel: Fluid-filled distended gastric cardia. Dilated fluid-filled proximal small bowel. Transition from dilated to nondilated in the mid central abdomen, series 4, image 51. More distal small bowel is decompressed. Mild mesenteric edema and small free fluid. No pneumatosis. The appendix is not confidently visualized on the current exam. Majority of the colon is decompressed. Occasional descending and sigmoid diverticula without diverticulitis. There is pelvic floor descent with small-bowel loops coursing posterior to the bladder. Vascular/Lymphatic: No acute vascular findings are evidence of vascular compromise. Aortic atherosclerosis. Patent portal vein. Patent mesenteric vessels. No enlarged lymph nodes in the abdomen or pelvis. Reproductive: Status post hysterectomy. No  adnexal masses. Other: Small volume of non organized free fluid in both upper quadrants, scattered throughout the  mesentery and in the pelvis. No free air or perforation. No focal fluid collection/abscess. Small fat containing upper ventral abdominal wall hernia. Musculoskeletal: Diffuse lumbar degenerative change. No acute osseous abnormality or focal osseous lesion. Scattered subcutaneous granuloma in both gluteal regions/flanks. IMPRESSION: 1. Small bowel obstruction with transition point in the mid central abdomen, likely due to adhesions. Small volume of non organized free fluid in the abdomen and pelvis. No perforation or evidence of ischemia. 2. Lobulated 2.5 cm left lower lobe pulmonary nodule with irregular margins, increased in size from prior exam. This is suspicious for primary bronchogenic malignancy. Recommend dedicated chest CT (with IV contrast) for complete evaluation of the thorax. 3. Mild hepatic steatosis. 4. Pelvic floor descent with small-bowel loops coursing posterior to the bladder. Aortic Atherosclerosis (ICD10-I70.0). Electronically Signed   By: Keith Rake M.D.   On: 05/17/2020 19:40   DG ABD ACUTE 2+V W 1V CHEST  Result Date: 05/17/2020 CLINICAL DATA:  Chronic upper abdominal pain EXAM: DG ABDOMEN ACUTE WITH 1 VIEW CHEST COMPARISON:  08/15/2017 FINDINGS: Supine and upright frontal views of the abdomen and pelvis as well as an upright frontal view of the chest are obtained. Cardiac silhouette is stable. No airspace disease, effusion, or pneumothorax. There is a dilated loop of distended gas-filled small bowel within the left upper quadrant, measuring up to 5.2 cm in diameter. Gas fluid level seen within the left upper quadrant. There is a paucity of distal small bowel gas and colonic gas. No free gas in the greater peritoneal sac. No masses or abnormal calcifications. IMPRESSION: 1. Findings compatible with high-grade small bowel obstruction. 2. No acute intrathoracic process. Electronically Signed   By: Randa Ngo M.D.   On: 05/17/2020 16:24   DG Abd Portable 1V  Result Date:  05/31/2020 CLINICAL DATA:  Postop state. EXAM: PORTABLE ABDOMEN - 1 VIEW COMPARISON:  05/23/2020. FINDINGS: Surgical staples noted over the abdomen. Gastric distention. Distention of multiple loops of small bowel noted on today's exam. Single large loop of dilated small bowel in the right upper abdomen is less dilated on today's exam. Colon is nondistended. No free air identified. Degenerative change thoracolumbar spine. IMPRESSION: 1. Gastric distention. 2. Distention of multiple loops of small bowel noted on today's exam. Single large loop of dilated small bowel in the right upper abdomen is less dilated on today's exam. Colon is nondistended. No free air identified. Continued follow-up exam suggested to demonstrate resolution of gastric and small bowel distention. Electronically Signed   By: Marcello Moores  Register   On: 05/31/2020 14:57   DG Abd Portable 1V  Result Date: 05/23/2020 CLINICAL DATA:  SBO EXAM: PORTABLE ABDOMEN - 1 VIEW COMPARISON:  05/22/2020 and prior. FINDINGS: Dilated right hemiabdomen small bowel loop measuring up to 5.3 cm. No evidence of pneumoperitoneum. Multilevel spondylosis. Bilateral body wall calcified granulomas. IMPRESSION: Dilated right hemiabdomen small bowel loop measuring up to 5.3 cm. Electronically Signed   By: Primitivo Gauze M.D.   On: 05/23/2020 08:43   DG Abd Portable 1V  Result Date: 05/22/2020 CLINICAL DATA:  Vomiting, abdominal pain EXAM: PORTABLE ABDOMEN - 1 VIEW COMPARISON:  05/21/2020 FINDINGS: NG tube remains in the stomach. Decreasing bowel distension. No organomegaly or free air. Small left pleural effusion noted with left base atelectasis. IMPRESSION: Improving bowel gas pattern with decreasing bowel distention since prior study. NG tube remains in the stomach. Electronically Signed  By: Rolm Baptise M.D.   On: 05/22/2020 11:33   DG Abd Portable 1V-Small Bowel Obstruction Protocol-initial, 8 hr delay  Result Date: 05/19/2020 CLINICAL DATA:  Small-bowel  obstruction EXAM: PORTABLE ABDOMEN - 1 VIEW COMPARISON:  05/18/2020 FINDINGS: Two supine frontal views of the abdomen and pelvis are obtained. Enteric catheter tip and side port project over the gastric fundus. There is excreted contrast within the bladder related to previous CT. Oral contrast is seen within the stomach and duodenal sweep. Dilute contrast is seen filling dilated loops of small bowel within the left mid abdomen. No evidence of distal small bowel or colonic contrast. IMPRESSION: 1. No evidence of colonic contrast. Persistent dilated small bowel consistent with small-bowel obstruction. Electronically Signed   By: Randa Ngo M.D.   On: 05/19/2020 20:18   DG Abd Portable 1V-Small Bowel Obstruction Protocol-initial, 8 hr delay  Result Date: 05/18/2020 CLINICAL DATA:  Small bowel obstruction EXAM: PORTABLE ABDOMEN - 1 VIEW COMPARISON:  05/17/2020 FINDINGS: Enteric tube has been placed with tip in the left upper quadrant. The proximal side hole projects over the EG junction region, possibly at or just below the EG junction. Advancement is suggested. Persistent gaseous distention of left upper quadrant bowel, similar to prior study. Residual contrast material in the bladder. IMPRESSION: Enteric tube tip is in the left upper quadrant with proximal side hole at or just below the EG junction. Advancement is suggested. Electronically Signed   By: Lucienne Capers M.D.   On: 05/18/2020 17:22   DG Abd Portable 1 View  Result Date: 05/17/2020 CLINICAL DATA:  Nasogastric tube placement EXAM: PORTABLE ABDOMEN - 1 VIEW COMPARISON:  05/17/2020 FINDINGS: Nasogastric tube extends into the upper abdomen with its tip overlying the expected proximal to mid body of the stomach. Visualized lung bases are clear. Dilated loops of gas-filled small bowel are seen within the left upper quadrant suggestive of underlying small-bowel obstruction, however, the mid and inferior abdomen are excluded from view. No gross  free intraperitoneal gas within the visualized abdomen. IMPRESSION: Nasogastric tube extends into the proximal to mid body of the stomach. Electronically Signed   By: Fidela Salisbury MD   On: 05/17/2020 23:03   [place] EKG SITE RITE  Result Date: 05/22/2020 If Site Rite image not attached, placement could not be confirmed due to current cardiac rhythm.

## 2020-06-03 ENCOUNTER — Other Ambulatory Visit: Payer: Self-pay

## 2020-06-03 MED ORDER — TRAMADOL HCL 50 MG PO TABS: 50.0000 mg | ORAL_TABLET | Freq: Four times a day (QID) | ORAL | 0 refills | Status: DC | PRN

## 2020-06-03 MED ORDER — ACETAMINOPHEN 325 MG PO TABS: 650.0000 mg | ORAL_TABLET | Freq: Four times a day (QID) | ORAL | Status: DC | PRN

## 2020-06-03 MED ORDER — TRAMADOL HCL 50 MG PO TABS: 50.0000 mg | ORAL_TABLET | Freq: Four times a day (QID) | ORAL | Status: DC | PRN

## 2020-06-03 NOTE — Discharge Summary (Signed)
Physician Discharge Summary  MCKINNLEY COTTIER HBZ:[id number] DOB: [birth year] DOA: 05/17/2020  PCP: [name], MD  Admit date: 05/17/2020 Discharge date: 06/03/2020 30 Day Unplanned Readmission Risk Score   Flowsheet Row ED to Hosp-Admission (Current) from 05/17/2020 in [place]  30 Day Unplanned Readmission Risk Score (%) 28.23 Filed at 06/03/2020 0801     This score is the patient's risk of an unplanned readmission within 30 days of being discharged (0 -100%). The score is based on dignosis, age, lab data, medications, orders, and past utilization.   Low:  0-14.9   Medium: 15-21.9   High: 22-29.9   Extreme: 30 and above         Admitted From: Home Disposition: Home  Recommendations for Outpatient Follow-up:  1. Follow up with PCP in 1-2 weeks 2. Follow-up with general surgery per the recommended date 3. Follow-up with pulmonology on 06/08/2020. 4. PCP to arrange outpatient thyroid ultrasound 5. Please obtain BMP/CBC in one week 6. Please follow up with your PCP on the following pending results: Unresulted Labs (From admission, onward)         None        Home Health: Yes Equipment/Devices: None  Discharge Condition: Stable CODE STATUS: Full code Diet recommendation: Soft diet, advance to regular diet as tolerated in next 1 to 2 days  Subjective: Seen and examined this morning.  Feels better.  No abdominal pain or nausea.  Tolerating soft diet.  Has had several bowel movements.  Brief/Interim Summary: Patient is a 80 y.o. female with PMHx of HTN, severe persistent asthma (on benralizumab infusion every 8 weeks)-prior history of SBO-who presented to the hospital with abdominal pain, nausea/vomiting-she was found to have SBO and incidental COVID infection.  She was subsequently admitted to the hospitalist service.  General surgery consulted.  She underwent laparoscopy and lysis of adhesions.  Postoperatively, she was started on TPN and  remained n.p.o. for several days before she was started on clear liquid diet which was advanced slowly over the course of last few days.  Her TPN has been stopped for 3 days.  She is tolerating soft diet very well and has had several bowel movements.  She has no complaints.  Although her appetite is poor which general surgery expects will improve over the course of next few weeks.  They have cleared her for discharge and they will follow-up closely with her as outpatient.  For her incidental COVID-19 infection, she received MAB infusion.  Patient will be discharged in stable condition with home health today.  I have discussed discharge planning with patient's daughter over the phone and she is in full agreement.  Left lung mass: Seen incidentally on imaging studies-will need pulmonary evaluation-bronchoscopy/biopsy-once she completely recovers from her acute illness. My colleague hospitalist discussed with pulmonology-Dr. Hebert Soho will arrange follow-up in the next 1-2 weeks with his office, and then a subsequent biopsy, reportedly she has appointment on 06/08/2020.Marland Kitchen  Epic message with patient demographics sent to him as well.   Enlarged/heterogeneous thyroid gland: Seen incidentally on CT chest-we will need outpatient thyroid ultrasound.  Discharge Diagnoses:  Principal Problem:   SBO (small bowel obstruction) (HCC) Active Problems:   HTN (hypertension)   Small bowel obstruction (HCC)   Asthma, severe persistent, well-controlled   Pulmonary nodule    Discharge Instructions   Allergies as of 06/03/2020      Reactions   Asa Buff (mag [buffered Aspirin] Other (See Comments)   Excessive  sweating   Fish Allergy Other (See Comments)   Unknown reaction, but allergic   Milk-related Compounds    Other Other (See Comments)   Gelcaps; gel-containing capsules; extended-release medication   Peanut-containing Drug Products Other (See Comments)   From allergy test   Penicillins Itching   Has  patient had a PCN reaction causing immediate rash, facial/tongue/throat swelling, SOB or lightheadedness with hypotension: No Has patient had a PCN reaction causing severe rash involving mucus membranes or skin necrosis: No Has patient had a PCN reaction that required hospitalization No Has patient had a PCN reaction occurring within the last 10 years: No If all of the above answers are "NO", then may proceed with Cephalosporin use.   Shellfish-derived Products Other (See Comments)   From allergy test   Strawberry Extract Other (See Comments)   Worsened allergy symptoms      Medication List    TAKE these medications   acetaminophen 325 MG tablet Commonly known as: TYLENOL Take 2 tablets (650 mg total) by mouth every 6 (six) hours as needed for headache. What changed:   medication strength  how much to take  when to take this  reasons to take this  additional instructions   amLODipine 10 MG tablet Commonly known as: NORVASC Take 1 tablet by mouth once daily   budesonide-formoterol 80-4.5 MCG/ACT inhaler Commonly known as: Symbicort Inhale 2 puffs into the lungs 2 (two) times daily. Use for asthma flares 2 puffs twice a day for 2 weeks or until cough and wheeze free What changed:   when to take this  reasons to take this  additional instructions   EPINEPHrine 0.3 mg/0.3 mL Soaj injection Commonly known as: EpiPen 2-Pak Use as directed for severe allergic reaction   Fasenra Pen 30 MG/ML Soaj Generic drug: Benralizumab Inject into the skin every 8 (eight) weeks.   ipratropium 0.06 % nasal spray Commonly known as: ATROVENT Place 2 sprays into both nostrils 3 (three) times daily.   iron polysaccharides 150 MG capsule Commonly known as: Ferrex 150 Take 1 capsule (150 mg total) by mouth daily.   Linzess 72 MCG capsule Generic drug: linaclotide TAKE 1 CAPSULE BY MOUTH ONCE DAILY AS NEEDED What changed: See the new instructions.   loratadine 10 MG  tablet Commonly known as: CLARITIN Take 10 mg by mouth daily.   losartan 100 MG tablet Commonly known as: COZAAR Take 1 tablet (100 mg total) by mouth daily.   montelukast 10 MG tablet Commonly known as: SINGULAIR Take 1 tablet (10 mg total) by mouth at bedtime. What changed:   when to take this  reasons to take this   omeprazole 40 MG capsule Commonly known as: PRILOSEC TAKE 1 CAPSULE DAILY FOR ACID REFLUX What changed: See the new instructions.   PHILLIPS MILK OF MAGNESIA PO Take 15-30 mLs by mouth daily as needed (for constipation).   polyethylene glycol powder 17 GM/SCOOP powder Commonly known as: GLYCOLAX/MIRALAX Take 17 g by mouth daily as needed for mild constipation (MIX AND DRINK).   pravastatin 40 MG tablet Commonly known as: PRAVACHOL Take 40 mg by mouth daily.   pravastatin 80 MG tablet Commonly known as: PRAVACHOL Take 1 tablet (80 mg total) by mouth daily.   traMADol 50 MG tablet Commonly known as: ULTRAM Take 1 tablet (50 mg total) by mouth every 6 (six) hours as needed. What changed: Another medication with the same name was added. Make sure you understand how and when to take each.  traMADol 50 MG tablet Commonly known as: ULTRAM Take 1 tablet (50 mg total) by mouth every 6 (six) hours as needed for moderate pain. What changed: You were already taking a medication with the same name, and this prescription was added. Make sure you understand how and when to take each.   vitamin B-12 1000 MCG tablet Commonly known as: CYANOCOBALAMIN Take 1,000 mcg by mouth daily.   Vitamin D3 50 MCG (2000 UT) Tabs Take 2,000 microcuries/1.52m2 by mouth daily.            Durable Medical Equipment  (From admission, onward)         Start     Ordered   05/27/20 1523  For home use only DME 4 wheeled rolling walker with seat  Once       Question:  Patient needs a walker to treat with the following condition  Answer:  Weakness   05/27/20 1522   05/27/20 1214   For home use only DME 3 n 1  Once        05/27/20 1213   05/27/20 1213  For home use only DME standard manual wheelchair with seat cushion  Once       Comments: Patient suffers from SBO: Secondary to lesions-s/p laparoscopy and lysis of adhesions which impairs their ability to perform daily activities like ambulating  in the home.  A cane will not resolve issue with performing activities of daily living. A wheelchair will allow patient to safely perform daily activities. Patient can safely propel the wheelchair in the home or has a caregiver who can provide assistance. Length of need lifetime . Accessories: elevating leg rests (ELRs), wheel locks, extensions and anti-tippers.  Seat and back cushions   05/27/20 1213          Follow-up Information    [facility] Follow up.   Specialty: Home Health Services Contact information: [address] [address] [phone]        Kinsinger, Arta Bruce, MD Follow up on 06/22/2020.   Specialty: General Surgery Why: 11:00am, arrive by 10:30am for paperwork and check in process Contact information: [place] [address] [phone]        [name], MD Follow up in 1 week(s).   Specialties: Internal Medicine, Radiology Contact information: [place] [address] [phone]              Allergies  Allergen Reactions  . Asa Buff (Mag [Buffered Aspirin] Other (See Comments)    Excessive sweating  . Fish Allergy Other (See Comments)    Unknown reaction, but allergic  . Milk-Related Compounds   . Other Other (See Comments)    Gelcaps; gel-containing capsules; extended-release medication  . Peanut-Containing Drug Products Other (See Comments)    From allergy test  . Penicillins Itching    Has patient had a PCN reaction causing immediate rash, facial/tongue/throat swelling, SOB or lightheadedness with hypotension: No Has patient had a PCN reaction  causing severe rash involving mucus membranes or skin necrosis: No Has patient had a PCN reaction that required hospitalization No Has patient had a PCN reaction occurring within the last 10 years: No If all of the above answers are "NO", then may proceed with Cephalosporin use.  Marland Kitchen Shellfish-Derived Products Other (See Comments)    From allergy test  . Strawberry Extract Other (See Comments)    Worsened allergy symptoms    Consultations: General  surgery   Procedures/Studies: DG Abd 1 View  Result Date: 05/21/2020 CLINICAL DATA:  80 year old female with small bowel obstruction. EXAM: ABDOMEN - 1 VIEW COMPARISON:  CT Abdomen and Pelvis 05/17/2020. Postcontrast abdominal radiographs 05/19/2020. FINDINGS: Portable AP supine views since 05/19/2020 the oral contrast has become dilute throughout the abdomen and pelvis. Still, faint contrast is visible within a dilated left abdominal loop seen previously (arrow). And there is no convincing large bowel contrast. Stable enteric tube visible in the epigastrium. Stable abdominal and pelvic visceral contours. Stable visualized osseous structures. At 1313 hours. IMPRESSION: 1. Persistent small bowel obstruction, with no evidence of improvement since 05/19/2020. 2. Stable enteric tube. Electronically Signed   By: Genevie Ann M.D.   On: 05/21/2020 13:31   CT CHEST W CONTRAST  Result Date: 05/17/2020 CLINICAL DATA:  May 17, 2020, abdominal CT. FINDINGS: Cardiovascular: Atheromatous plaque in the thoracic aorta. No aneurysmal dilation. Noncalcified plaque as well. Normal heart size. Three-vessel coronary artery disease. Central pulmonary vasculature unremarkable on venous phase assessment. Mediastinum/Nodes: Heterogeneous and enlarged thyroid gland, multinodular changes. No axillary lymphadenopathy. Enlarged peribronchial lymph node on image 69 of series 3 a 13 mm. Borderline enlarged LEFT infrahilar lymph node at 9 mm on image 71 of series 3. Subcarinal lymph  node at 14 mm on image 61 of series 3. No additional mediastinal or hilar nodal enlargement. Esophagus mildly patulous. Lungs/Pleura: Pulmonary mass measuring 3.5 x 2.8 cm on image 94 of series 4 with spiculated margins contacting the visceral pleura in the LEFT lower lobe, situated in the medial aspect of the LEFT lower lobe. Direct extension of the mass versus is infra hilar lymph node on image 88 of series 4 measuring 13 mm. Bronchial narrowing of secondary bronchial structures at the level of the mass beneath the LEFT hilum. Airways are otherwise patent. Tiny nodule in the RIGHT chest on image 75 series 4 approximately 2-3 mm. Upper Abdomen: Incidental imaging of upper abdominal contents again with small amount of perisplenic fluid and marked small bowel dilation. Visible adrenal glands are normal. There is excreted contrast within the kidneys partially visualized. Musculoskeletal: No acute musculoskeletal finding. No destructive bone lesion. Spinal degenerative changes. IMPRESSION: 1. LEFT lower lobe mass with LEFT hilar and mediastinal adenopathy most suggestive of bronchogenic carcinoma. Referral to multi disciplinary thoracic oncologic setting with PET or biopsy for further assessment is suggested. 2. Tiny 2-3 mm nodule in the RIGHT chest on image 75 series 4. Attention on follow-up. 3. Signs of bowel obstruction and fluid in the upper abdomen about the spleen partially imaged. 4. Enlarged and heterogeneous thyroid. Recommend thyroid ultrasound (ref: J Am Coll Radiol. 2015 Feb;12(2): 143-50). 5. Three-vessel coronary artery disease. 6. Aortic atherosclerosis Aortic Atherosclerosis (ICD10-I70.0). Electronically Signed   By: Zetta Bills M.D.   On: 05/17/2020 21:21   CT ABDOMEN PELVIS W CONTRAST  Result Date: 05/17/2020 CLINICAL DATA:  Abdominal pain. Vomiting. Possible bowel obstruction on radiograph. EXAM: CT ABDOMEN AND PELVIS WITH CONTRAST TECHNIQUE: Multidetector CT imaging of the abdomen and  pelvis was performed using the standard protocol following bolus administration of intravenous contrast. CONTRAST:  139mL OMNIPAQUE IOHEXOL 300 MG/ML  SOLN COMPARISON:  Radiographs earlier today.  CT 02/04/2017 FINDINGS: Lower chest: Lobulated 2.5 cm left lower lobe pulmonary nodule with irregular margins, increased in size from prior exam. No pleural fluid or acute airspace disease. Hepatobiliary: Mild hepatic steatosis. No focal hepatic lesion. Gallbladder physiologically distended, no calcified stone. No biliary dilatation. Pancreas: Parenchymal atrophy. No ductal dilatation or  inflammation. Spleen: Normal in size without focal abnormality. Adrenals/Urinary Tract: Normal adrenal glands. No hydronephrosis or perinephric edema. Homogeneous renal enhancement with symmetric excretion on delayed phase imaging. Tiny hypodensity in the upper left kidney is too small to accurately characterize. Urinary bladder is physiologically distended without wall thickening. Slight cystocele. Stomach/Bowel: Fluid-filled distended gastric cardia. Dilated fluid-filled proximal small bowel. Transition from dilated to nondilated in the mid central abdomen, series 4, image 51. More distal small bowel is decompressed. Mild mesenteric edema and small free fluid. No pneumatosis. The appendix is not confidently visualized on the current exam. Majority of the colon is decompressed. Occasional descending and sigmoid diverticula without diverticulitis. There is pelvic floor descent with small-bowel loops coursing posterior to the bladder. Vascular/Lymphatic: No acute vascular findings are evidence of vascular compromise. Aortic atherosclerosis. Patent portal vein. Patent mesenteric vessels. No enlarged lymph nodes in the abdomen or pelvis. Reproductive: Status post hysterectomy. No adnexal masses. Other: Small volume of non organized free fluid in both upper quadrants, scattered throughout the mesentery and in the pelvis. No free air or  perforation. No focal fluid collection/abscess. Small fat containing upper ventral abdominal wall hernia. Musculoskeletal: Diffuse lumbar degenerative change. No acute osseous abnormality or focal osseous lesion. Scattered subcutaneous granuloma in both gluteal regions/flanks. IMPRESSION: 1. Small bowel obstruction with transition point in the mid central abdomen, likely due to adhesions. Small volume of non organized free fluid in the abdomen and pelvis. No perforation or evidence of ischemia. 2. Lobulated 2.5 cm left lower lobe pulmonary nodule with irregular margins, increased in size from prior exam. This is suspicious for primary bronchogenic malignancy. Recommend dedicated chest CT (with IV contrast) for complete evaluation of the thorax. 3. Mild hepatic steatosis. 4. Pelvic floor descent with small-bowel loops coursing posterior to the bladder. Aortic Atherosclerosis (ICD10-I70.0). Electronically Signed   By: Keith Rake M.D.   On: 05/17/2020 19:40   DG ABD ACUTE 2+V W 1V CHEST  Result Date: 05/17/2020 CLINICAL DATA:  Chronic upper abdominal pain EXAM: DG ABDOMEN ACUTE WITH 1 VIEW CHEST COMPARISON:  08/15/2017 FINDINGS: Supine and upright frontal views of the abdomen and pelvis as well as an upright frontal view of the chest are obtained. Cardiac silhouette is stable. No airspace disease, effusion, or pneumothorax. There is a dilated loop of distended gas-filled small bowel within the left upper quadrant, measuring up to 5.2 cm in diameter. Gas fluid level seen within the left upper quadrant. There is a paucity of distal small bowel gas and colonic gas. No free gas in the greater peritoneal sac. No masses or abnormal calcifications. IMPRESSION: 1. Findings compatible with high-grade small bowel obstruction. 2. No acute intrathoracic process. Electronically Signed   By: Randa Ngo M.D.   On: 05/17/2020 16:24   DG Abd Portable 1V  Result Date: 05/31/2020 CLINICAL DATA:  Postop state. EXAM:  PORTABLE ABDOMEN - 1 VIEW COMPARISON:  05/23/2020. FINDINGS: Surgical staples noted over the abdomen. Gastric distention. Distention of multiple loops of small bowel noted on today's exam. Single large loop of dilated small bowel in the right upper abdomen is less dilated on today's exam. Colon is nondistended. No free air identified. Degenerative change thoracolumbar spine. IMPRESSION: 1. Gastric distention. 2. Distention of multiple loops of small bowel noted on today's exam. Single large loop of dilated small bowel in the right upper abdomen is less dilated on today's exam. Colon is nondistended. No free air identified. Continued follow-up exam suggested to demonstrate resolution of gastric and small bowel distention.  Electronically Signed   By: Marcello Moores  Register   On: 05/31/2020 14:57   DG Abd Portable 1V  Result Date: 05/23/2020 CLINICAL DATA:  SBO EXAM: PORTABLE ABDOMEN - 1 VIEW COMPARISON:  05/22/2020 and prior. FINDINGS: Dilated right hemiabdomen small bowel loop measuring up to 5.3 cm. No evidence of pneumoperitoneum. Multilevel spondylosis. Bilateral body wall calcified granulomas. IMPRESSION: Dilated right hemiabdomen small bowel loop measuring up to 5.3 cm. Electronically Signed   By: Primitivo Gauze M.D.   On: 05/23/2020 08:43   DG Abd Portable 1V  Result Date: 05/22/2020 CLINICAL DATA:  Vomiting, abdominal pain EXAM: PORTABLE ABDOMEN - 1 VIEW COMPARISON:  05/21/2020 FINDINGS: NG tube remains in the stomach. Decreasing bowel distension. No organomegaly or free air. Small left pleural effusion noted with left base atelectasis. IMPRESSION: Improving bowel gas pattern with decreasing bowel distention since prior study. NG tube remains in the stomach. Electronically Signed   By: Rolm Baptise M.D.   On: 05/22/2020 11:33   DG Abd Portable 1V-Small Bowel Obstruction Protocol-initial, 8 hr delay  Result Date: 05/19/2020 CLINICAL DATA:  Small-bowel obstruction EXAM: PORTABLE ABDOMEN - 1 VIEW  COMPARISON:  05/18/2020 FINDINGS: Two supine frontal views of the abdomen and pelvis are obtained. Enteric catheter tip and side port project over the gastric fundus. There is excreted contrast within the bladder related to previous CT. Oral contrast is seen within the stomach and duodenal sweep. Dilute contrast is seen filling dilated loops of small bowel within the left mid abdomen. No evidence of distal small bowel or colonic contrast. IMPRESSION: 1. No evidence of colonic contrast. Persistent dilated small bowel consistent with small-bowel obstruction. Electronically Signed   By: Randa Ngo M.D.   On: 05/19/2020 20:18   DG Abd Portable 1V-Small Bowel Obstruction Protocol-initial, 8 hr delay  Result Date: 05/18/2020 CLINICAL DATA:  Small bowel obstruction EXAM: PORTABLE ABDOMEN - 1 VIEW COMPARISON:  05/17/2020 FINDINGS: Enteric tube has been placed with tip in the left upper quadrant. The proximal side hole projects over the EG junction region, possibly at or just below the EG junction. Advancement is suggested. Persistent gaseous distention of left upper quadrant bowel, similar to prior study. Residual contrast material in the bladder. IMPRESSION: Enteric tube tip is in the left upper quadrant with proximal side hole at or just below the EG junction. Advancement is suggested. Electronically Signed   By: Lucienne Capers M.D.   On: 05/18/2020 17:22   DG Abd Portable 1 View  Result Date: 05/17/2020 CLINICAL DATA:  Nasogastric tube placement EXAM: PORTABLE ABDOMEN - 1 VIEW COMPARISON:  05/17/2020 FINDINGS: Nasogastric tube extends into the upper abdomen with its tip overlying the expected proximal to mid body of the stomach. Visualized lung bases are clear. Dilated loops of gas-filled small bowel are seen within the left upper quadrant suggestive of underlying small-bowel obstruction, however, the mid and inferior abdomen are excluded from view. No gross free intraperitoneal gas within the visualized  abdomen. IMPRESSION: Nasogastric tube extends into the proximal to mid body of the stomach. Electronically Signed   By: Fidela Salisbury MD   On: 05/17/2020 23:03   [place] EKG SITE RITE  Result Date: 05/22/2020 If Site Rite image not attached, placement could not be confirmed due to current cardiac rhythm.     Discharge Exam: Vitals:   06/03/20 0414 06/03/20 0908  BP: 110/66   Pulse: 89 80  Resp: 18 17  Temp: 98.4 F (36.9 C)   SpO2: 99% 98%   Vitals:  06/02/20 1352 06/02/20 2034 06/03/20 0414 06/03/20 0908  BP: 110/65 112/63 110/66   Pulse: 89 90 89 80  Resp: 18 18 18 17   Temp: 98.7 F (37.1 C) 98.5 F (36.9 C) 98.4 F (36.9 C)   TempSrc: Oral Oral Oral   SpO2: 99% 99% 99% 98%  Weight:      Height:        General: Pt is alert, awake, not in acute distress Cardiovascular: RRR, S1/S2 +, no rubs, no gallops Respiratory: CTA bilaterally, no wheezing, no rhonchi Abdominal: Soft, NT, ND, bowel sounds + Extremities: no edema, no cyanosis    The results of significant diagnostics from this hospitalization (including imaging, microbiology, ancillary and laboratory) are listed below for reference.     Microbiology: No results found for this or any previous visit (from the past 240 hour(s)).   Labs: BNP (last 3 results) No results for input(s): BNP in the last 8760 hours. Basic Metabolic Panel: Recent Labs  Lab 05/29/20 0055 05/30/20 0340 06/01/20 0414  NA 134* 135 134*  K 4.5 4.0 4.6  CL 102 102 102  CO2 23 24 23   GLUCOSE 109* 116* 98  BUN 18 14 27*  CREATININE 0.74 0.77 0.94  CALCIUM 8.3* 8.6* 8.3*  MG  --  1.9 2.0  PHOS  --  3.8 4.3   Liver Function Tests: Recent Labs  Lab 05/30/20 0340  AST 140*  ALT 245*  ALKPHOS 107  BILITOT 0.6  PROT 6.0*  ALBUMIN 2.5*   No results for input(s): LIPASE, AMYLASE in the last 168 hours. No results for input(s): AMMONIA in the last 168 hours. CBC: Recent Labs  Lab 05/30/20 0340  WBC 12.8*  NEUTROABS 8.7*  HGB  11.3*  HCT 35.0*  MCV 91.4  PLT 253   Cardiac Enzymes: No results for input(s): CKTOTAL, CKMB, CKMBINDEX, TROPONINI in the last 168 hours. BNP: Invalid input(s): POCBNP CBG: Recent Labs  Lab 05/31/20 0009 05/31/20 0600 05/31/20 1247 05/31/20 1825 05/31/20 2352  GLUCAP 134* 130* 144* 148* 111*   D-Dimer No results for input(s): DDIMER in the last 72 hours. Hgb A1c No results for input(s): HGBA1C in the last 72 hours. Lipid Profile No results for input(s): CHOL, HDL, LDLCALC, TRIG, CHOLHDL, LDLDIRECT in the last 72 hours. Thyroid function studies No results for input(s): TSH, T4TOTAL, T3FREE, THYROIDAB in the last 72 hours.  Invalid input(s): FREET3 Anemia work up No results for input(s): VITAMINB12, FOLATE, FERRITIN, TIBC, IRON, RETICCTPCT in the last 72 hours. Urinalysis    Component Value Date/Time   COLORURINE YELLOW 05/03/2020 1707   APPEARANCEUR CLEAR 05/03/2020 1707   LABSPEC 1.020 05/03/2020 1707   PHURINE 6.0 05/03/2020 1707   GLUCOSEU NEGATIVE 05/03/2020 1707   HGBUR NEGATIVE 05/03/2020 1707   BILIRUBINUR NEGATIVE 05/03/2020 1707   KETONESUR NEGATIVE 05/03/2020 1707   PROTEINUR NEGATIVE 08/15/2017 0045   UROBILINOGEN 0.2 05/03/2020 1707   NITRITE NEGATIVE 05/03/2020 1707   LEUKOCYTESUR NEGATIVE 05/03/2020 1707   Sepsis Labs Invalid input(s): PROCALCITONIN,  WBC,  LACTICIDVEN Microbiology No results found for this or any previous visit (from the past 240 hour(s)).   Time coordinating discharge: Over 30 minutes  SIGNED:   Darliss Cheney, MD  [facility] 06/03/2020, 10:33 AM  If 7PM-7AM, please contact night-coverage [email or web address]

## 2020-06-03 NOTE — Plan of Care (Signed)
Patient discharged home with daughter.  Instructions reviewed with daughter and patient. Denies questions.   Problem: Education: Goal: Knowledge of General Education information will improve Description: Including pain rating scale, medication(s)/side effects and non-pharmacologic comfort measures Outcome: Adequate for Discharge   Problem: Health Behavior/Discharge Planning: Goal: Ability to manage health-related needs will improve Outcome: Adequate for Discharge   Problem: Clinical Measurements: Goal: Will remain free from infection Outcome: Adequate for Discharge Goal: Diagnostic test results will improve Outcome: Adequate for Discharge   Problem: Activity: Goal: Risk for activity intolerance will decrease Outcome: Adequate for Discharge   Problem: Nutrition: Goal: Adequate nutrition will be maintained Outcome: Adequate for Discharge   Problem: Elimination: Goal: Will not experience complications related to bowel motility Outcome: Adequate for Discharge   Problem: Education: Goal: Knowledge of risk factors and measures for prevention of condition will improve Outcome: Adequate for Discharge   Problem: Respiratory: Goal: Will maintain a patent airway Outcome: Adequate for Discharge

## 2020-06-03 NOTE — Care Management Important Message (Signed)
Important Message  Patient Details  Name: [name] MRN: [id number] Date of Birth: [birth year]   Medicare Important Message Given:  Yes     Orbie Pyo 06/03/2020, 3:41 PM

## 2020-06-03 NOTE — Progress Notes (Signed)
11 Days Post-Op    CC: Abdominal pain  Subjective: tolerating soft diet.  Hasn't eaten a ton, but is drinking her resource breeze and a lot of water.  She is not nauseated and is having bowel function  Objective: Vital signs in last 24 hours: Temp:  [98.4 F (36.9 C)-98.7 F (37.1 C)] 98.4 F (36.9 C) (01/14 0414) Pulse Rate:  [80-90] 80 (01/14 0908) Resp:  [17-18] 17 (01/14 0908) BP: (110-112)/(63-66) 110/66 (01/14 0414) SpO2:  [98 %-99 %] 98 % (01/14 0908) Last BM Date: 06/01/20  Intake/Output from previous day: 01/13 0701 - 01/14 0700 In: 120 [P.O.:120] Out: 750 [Urine:750] Intake/Output this shift: No intake/output data recorded.  PE: Abd: soft, appropriately tender, incision c/d/i with steri-strips in place, +BS, ND  Lab Results:  No results for input(s): WBC, HGB, HCT, PLT in the last 72 hours.  BMET Recent Labs    06/01/20 0414  NA 134*  K 4.6  CL 102  CO2 23  GLUCOSE 98  BUN 27*  CREATININE 0.94  CALCIUM 8.3*   PT/INR No results for input(s): LABPROT, INR in the last 72 hours.  Recent Labs  Lab 05/30/20 0340  AST 140*  ALT 245*  ALKPHOS 107  BILITOT 0.6  PROT 6.0*  ALBUMIN 2.5*     Lipase     Component Value Date/Time   LIPASE 24 05/17/2020 1756     Medications: . amLODipine  10 mg Oral Daily  . Chlorhexidine Gluconate Cloth  6 each Topical Daily  . feeding supplement  1 Container Oral TID BM  . guaiFENesin-dextromethorphan  5 mL Oral Once  . losartan  100 mg Oral Daily  . mometasone-formoterol  2 puff Inhalation BID  . multivitamin with minerals  1 tablet Oral Daily  . 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  POD 11, S/pdiagnostic laparoscopic, open lysis of adhesions, enterrorhaphy1/4/22 Dr. Kieth Brightly for SBO - Bowel function normal and tolerating a soft diet and supplements.  Suspect appetite will not be great for a while due to surgery and age.  Should improve with time. - mobilize, PT/OT -pulm toilet -she is surgically stable from our standpoint for DC home if felt to be medically stable.  Relayed message to primary service.  ID -none FEN - soft diet VTE -SCDs, lovenox Foley -none Follow up -Dr. Kieth Brightly     LOS: 17 days    Henreitta Cea 06/03/2020 Please see Amion

## 2020-06-06 ENCOUNTER — Other Ambulatory Visit: Payer: Self-pay

## 2020-06-06 DIAGNOSIS — R079 Chest pain, unspecified: Secondary | ICD-10-CM | POA: Diagnosis not present

## 2020-06-06 DIAGNOSIS — R0789 Other chest pain: Secondary | ICD-10-CM | POA: Diagnosis not present

## 2020-06-06 NOTE — Patient Outreach (Addendum)
[facility]) Care Management  06/06/2020  Angelica [name] [birth year] [id number]     Transition of Care Referral  Referral Date: 06/06/2020 Referral Source: [facility] Liaison Date of Discharge: 06/03/2020 Facility: [place]: Mcarthur Rossetti Medicare PCP Office [facility]    EMMI-General Discharge RED ON [place] Day # 1 Date: 06/05/2020 Red Alert Reason: "Scheduled follow-up? No"     Outreach attempt # 1 to patient. Spoke with patient who was pleased to report how well she has bene doing since returning home. She voices she has had no pain. She has been eating more. She states he is eating about two meals per day and snacking a little. She denies any GI issues. LBM was last night. She reports that her daughter has made follow up appt for later this week. Her daughter is helping her keep track of meds and voices she is taking meds as ordered. She has not heard from [facility] yet as she was discharged over the weather prior to inclement weather. She is aware to follow up with agency if she does not hear from them by the end of today. [facility] services discussed and reviewed with patient and freq of calls. Patient preferred to take RN CM contact info and to call if needed.     Plan: RN CM will close case at this time.    Enzo Montgomery, RN,BSN,CCM [facility] Telephonic Care Management Coordinator Direct Phone: [phone] Toll Free: [phone] Fax: [phone]

## 2020-06-07 ENCOUNTER — Telehealth: Payer: Self-pay | Admitting: Pulmonary Disease

## 2020-06-07 ENCOUNTER — Telehealth: Payer: Self-pay | Admitting: Internal Medicine

## 2020-06-07 DIAGNOSIS — R918 Other nonspecific abnormal finding of lung field: Secondary | ICD-10-CM | POA: Diagnosis not present

## 2020-06-07 DIAGNOSIS — M170 Bilateral primary osteoarthritis of knee: Secondary | ICD-10-CM | POA: Diagnosis not present

## 2020-06-07 DIAGNOSIS — Z48815 Encounter for surgical aftercare following surgery on the digestive system: Secondary | ICD-10-CM | POA: Diagnosis not present

## 2020-06-07 DIAGNOSIS — U071 COVID-19: Secondary | ICD-10-CM | POA: Diagnosis not present

## 2020-06-07 DIAGNOSIS — J4550 Severe persistent asthma, uncomplicated: Secondary | ICD-10-CM | POA: Diagnosis not present

## 2020-06-07 DIAGNOSIS — M19041 Primary osteoarthritis, right hand: Secondary | ICD-10-CM | POA: Diagnosis not present

## 2020-06-07 DIAGNOSIS — K565 Intestinal adhesions [bands], unspecified as to partial versus complete obstruction: Secondary | ICD-10-CM | POA: Diagnosis not present

## 2020-06-07 DIAGNOSIS — I10 Essential (primary) hypertension: Secondary | ICD-10-CM | POA: Diagnosis not present

## 2020-06-07 DIAGNOSIS — E049 Nontoxic goiter, unspecified: Secondary | ICD-10-CM | POA: Diagnosis not present

## 2020-06-07 NOTE — Telephone Encounter (Signed)
Called and spoke to patient - she will have her daughter call to schedule consult with Dr. Valeta Harms to discuss bronch -when daughter calls okay to double book consult at [place] or 1:30pm slot on either 06/08/2020 or 06/09/2020 per Dr. Valeta Harms. -pr

## 2020-06-07 NOTE — Telephone Encounter (Signed)
[place] for verbal, and will see at next OV, thanks

## 2020-06-07 NOTE — Telephone Encounter (Signed)
Orders given for PT as requested; Patient has confirmed her appointment with Dr. Jenny Reichmann for Friday 1/21 in office; Will forward message to him to review in preparation for upcoming appointment.

## 2020-06-07 NOTE — Telephone Encounter (Signed)
Angelica [name] is requesting verbals for pt 1w1, 2w3, 1w5. There is also a pending OT eval, referral to [facility] for community resources, referral for nursing for disease and medication management. Patient was in the hospital and when he saw the patient her bp was 160/96 and she was asymptomatic, they had to call 911 last night for chest pain and stomach problems and EMS said that it was Gurd related not cardiac. There are several medications that the patient is not taking as listed.   Medications that the patient does not have/ is not taking:   Iron Linzess  Losartan  Singulair Omeprazole  Symbicort- Instead they have [facility]

## 2020-06-07 NOTE — Telephone Encounter (Signed)
-----   Message from Garner Nash, DO sent at 06/03/2020  1:41 PM EST ----- Regarding: RE: Needs Bronch + appt FYI she is being discharged today.   Maybe we can reach to her/Family and get her on the schedule for next week?  I am sure I have slots on the 19th  Thanks,  Leory Plowman    ----- Message ----- From: Lawana Chambers Sent: 06/01/2020   2:07 PM EST To: Elie Confer, CMA, Garner Nash, DO Subject: RE: Needs Bronch + appt                        Patient still admitted to [facility] - will keep checking to see when patient is discharged -pr  ----- Message ----- From: Garner Nash, DO Sent: 05/27/2020  10:41 AM EST To: Elie Confer, CMA, Lawana Chambers Subject: FW: Needs Bronch + appt                        Greggory Brandy,   Please keep on radar for possible appt the week of 17th  Thanks  Brad    ----- Message ----- From: Jonetta Osgood, MD Sent: 05/26/2020   5:34 PM EST To: Garner Nash, DO Subject: Needs Bronch                                   Brad. this patient needs w/u for lung mass

## 2020-06-09 ENCOUNTER — Other Ambulatory Visit: Payer: Self-pay

## 2020-06-09 ENCOUNTER — Ambulatory Visit: Payer: Medicare HMO | Admitting: Pulmonary Disease

## 2020-06-09 ENCOUNTER — Inpatient Hospital Stay (HOSPITAL_COMMUNITY): Admission: RE | Admit: 2020-06-09 | Payer: Self-pay | Source: Ambulatory Visit

## 2020-06-09 ENCOUNTER — Telehealth: Payer: Self-pay | Admitting: Internal Medicine

## 2020-06-09 ENCOUNTER — Encounter: Payer: Self-pay | Admitting: Pulmonary Disease

## 2020-06-09 ENCOUNTER — Encounter (HOSPITAL_COMMUNITY): Payer: Self-pay | Admitting: Pulmonary Disease

## 2020-06-09 VITALS — BP 118/70 | HR 82 | Temp 98.2°F | Ht 65.0 in | Wt 171.2 lb

## 2020-06-09 DIAGNOSIS — R599 Enlarged lymph nodes, unspecified: Secondary | ICD-10-CM | POA: Diagnosis present

## 2020-06-09 DIAGNOSIS — Z8616 Personal history of COVID-19: Secondary | ICD-10-CM

## 2020-06-09 DIAGNOSIS — J4550 Severe persistent asthma, uncomplicated: Secondary | ICD-10-CM | POA: Diagnosis not present

## 2020-06-09 DIAGNOSIS — U071 COVID-19: Secondary | ICD-10-CM | POA: Diagnosis not present

## 2020-06-09 DIAGNOSIS — R918 Other nonspecific abnormal finding of lung field: Secondary | ICD-10-CM

## 2020-06-09 DIAGNOSIS — I10 Essential (primary) hypertension: Secondary | ICD-10-CM | POA: Diagnosis not present

## 2020-06-09 DIAGNOSIS — Z48815 Encounter for surgical aftercare following surgery on the digestive system: Secondary | ICD-10-CM | POA: Diagnosis not present

## 2020-06-09 DIAGNOSIS — K565 Intestinal adhesions [bands], unspecified as to partial versus complete obstruction: Secondary | ICD-10-CM | POA: Diagnosis not present

## 2020-06-09 DIAGNOSIS — M170 Bilateral primary osteoarthritis of knee: Secondary | ICD-10-CM | POA: Diagnosis not present

## 2020-06-09 DIAGNOSIS — M19041 Primary osteoarthritis, right hand: Secondary | ICD-10-CM | POA: Diagnosis not present

## 2020-06-09 DIAGNOSIS — E049 Nontoxic goiter, unspecified: Secondary | ICD-10-CM | POA: Diagnosis not present

## 2020-06-09 MED ORDER — LOSARTAN POTASSIUM 100 MG PO TABS: 100.0000 mg | ORAL_TABLET | Freq: Every day | ORAL | 3 refills | Status: DC

## 2020-06-09 NOTE — Telephone Encounter (Signed)
Left voicemail on daughters device for the pt to take all medications as prescribed [phone]

## 2020-06-09 NOTE — Telephone Encounter (Signed)
   Daughter requesting refill for Losartan be refilled to [place], [place]

## 2020-06-09 NOTE — Progress Notes (Signed)
PCP:  Cathlean Cower Cardiologist:  Denies  EKG:  05/18/20 CXR:  07/06/17 ECHO:  Denies Stress Test: Denies Cardiac Cath:  Denies  Fasting Blood Sugar-  N/A Checks Blood Sugar_NA_ times a day  OSA/CPAP:  No  ASA/Blood Thinners:  No  Covid test positive on 05/17/20.  No need to retest.  Anesthesia Review:  No  Patient denies shortness of breath, fever, cough, and chest pain at [name] appointment.  Patient verbalized understanding of instructions provided today at the [name] appointment.  Patient asked to review instructions at home and day of surgery.

## 2020-06-09 NOTE — Patient Instructions (Addendum)
Thank you for visiting Dr. Valeta Harms at [facility]. Today we recommend the following:  Orders Placed This Encounter  Procedures  . Procedural/ Surgical Case Request: VIDEO BRONCHOSCOPY WITH ENDOBRONCHIAL NAVIGATION  . NM PET Image Initial (PI) Skull Base To Thigh  . Ambulatory referral to Pulmonology   Nothing to eat tonight past midnight. Case tomorrow planned at [facility].   Return in about 4 weeks (around 07/07/2020) for with APP or Dr. Valeta Harms.    Please do your part to reduce the spread of COVID-19.

## 2020-06-09 NOTE — Progress Notes (Signed)
Synopsis: Referred in January 2022 for lung mass by [name], MD  Subjective:   PATIENT ID: Angelica [name] GENDER: female DOB: [birth year], MRN: [id number]  Chief Complaint  Patient presents with  . Consult    Lung mass.  Pt stated that she is not having any SHOB but is having pain    This is an 80 year old female with a past medical history of bowel obstruction, hyperlipidemia, hypertension, thyroid disease, asthma, allergies on Fasenra [facility] by [facility].  Patient had a recent hospitalization, found to be COVID-positive, both vaccines plus booster.  She had a small bowel obstruction with abdominal pain nausea vomiting, incidental COVID infection.  Seen by general surgery underwent laparoscopy with lysis of adhesions during hospitalization had to be started on TPN.  Ultimately was discharged home.  During this evaluation patient found to have a left-sided lower lobe lung mass with associated mediastinal adenopathy.  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  Patient was referred at the time of discharge from the hospital for evaluation by pulmonary medicine for consideration of tissue diagnosis and biopsy after recovery from COVID-19.  At this time patient has no significant respiratory complaints has been doing well at home.  Patient accompanied today in the office by patient's daughter.  They are in agreements for consideration for tissue biopsy and bronchoscopy.  Patient denies fevers chills, denies hemoptysis.  She has been losing weight she does have poor appetite.   Past Medical History:  Diagnosis Date  . Arthritis   . Asthma   . Bowel obstruction ([place])   . Environmental allergies   . GERD (gastroesophageal reflux disease)   . Glaucoma 05/09/2017  . H. pylori infection   . HLD (hyperlipidemia)  01/16/2019  . Hypertension   . Iron deficiency anemia   . Osteopenia 05/26/2017  . Thyroid disease   . Urticaria 07/25/2018     Family History  Problem Relation Age of Onset  . Diabetes Mother   . Hypertension Mother   . Hypertension Father   . Glaucoma Sister   . Glaucoma Brother   . Allergic rhinitis Neg Hx   . Angioedema Neg Hx   . Asthma Neg Hx   . Eczema Neg Hx   . Immunodeficiency Neg Hx   . Urticaria Neg Hx      Past Surgical History:  Procedure Laterality Date  . ABDOMINAL HYSTERECTOMY    . BOWEL RESECTION N/A 05/23/2020   Procedure: SMALL BOWEL REPAIR;  Surgeon: Kinsinger, Arta Bruce, MD;  Location: [facility];  Service: General;  Laterality: N/A;  . BREAST BIOPSY    . COLON SURGERY    . DIAGNOSTIC LARYNGOSCOPY N/A 05/23/2020   Procedure: ATTEMPTED DIAGNOSTIC LAPAROSCOPY WITH ADHESIONS;  Surgeon: Kieth Brightly, Arta Bruce, MD;  Location: [facility];  Service: General;  Laterality: N/A;  . KNEE SURGERY    . LAPAROTOMY N/A 04/18/2015   Procedure: Exploratory laparotomy with lysis of adhesions, possible bowel resection;  Surgeon: Ralene Ok, MD;  Location: [facility];  Service: General;  Laterality: N/A;  . LAPAROTOMY N/A 05/23/2020   Procedure: EXPLORATORY LAPAROTOMY;  Surgeon: Kieth Brightly Arta Bruce, MD;  Location: [facility];  Service: General;  Laterality: N/A;  . LYSIS OF ADHESION N/A 05/23/2020   Procedure: LYSIS OF ADHESION;  Surgeon: Kieth Brightly Arta Bruce, MD;  Location: [facility];  Service: General;  Laterality: N/A;  . NASAL SINUS SURGERY    . THYROID SURGERY      Social History   Socioeconomic History  . Marital status: Widowed    Spouse name: Not on file  . Number of children: 4  . Years of education: Not on file  . Highest education level: Not on file  Occupational History  . Occupation: retired  Tobacco Use  . Smoking status: Former Smoker    Types: Cigarettes  . Smokeless tobacco: Never Used  Vaping Use  . Vaping Use: Never used  Substance and Sexual Activity  . Alcohol  use: No  . Drug use: No  . Sexual activity: Never  Other Topics Concern  . Not on file  Social History Narrative  . Not on file   Social Determinants of Health   Financial Resource Strain: Low Risk   . Difficulty of Paying Living Expenses: Not hard at all  Food Insecurity: No Food Insecurity  . Worried About Charity fundraiser in the Last Year: Never true  . Ran Out of Food in the Last Year: Never true  Transportation Needs: No Transportation Needs  . Lack of Transportation (Medical): No  . Lack of Transportation (Non-Medical): No  Physical Activity: Sufficiently Active  . Days of Exercise per Week: 5 days  . Minutes of Exercise per Session: 30 min  Stress: No Stress Concern Present  . Feeling of Stress : Not at all  Social Connections: Moderately Integrated  . Frequency of Communication with Friends and Family: More than three times a week  . Frequency of Social Gatherings with Friends and Family: More than three times a week  . Attends Religious Services: More than 4 times per year  . Active Member of Clubs or Organizations: Yes  . Attends Archivist Meetings: More than 4 times per year  . Marital Status: Widowed  Intimate Partner Violence: Not on file     Allergies  Allergen Reactions  . Asa Buff (Mag [Buffered Aspirin] Other (See Comments)    Excessive sweating  . Fish Allergy Other (See Comments)    Unknown reaction, but allergic  . Milk-Related Compounds   . Other Other (See Comments)    Gelcaps; gel-containing capsules; extended-release medication  . Peanut-Containing Drug Products Other (See Comments)    From allergy test  . Penicillins Itching    Has patient had a PCN reaction causing immediate rash, facial/tongue/throat swelling, SOB or lightheadedness with hypotension: No Has patient had a PCN reaction causing severe rash involving mucus membranes or skin necrosis: No Has patient had a PCN reaction that required hospitalization No Has patient had  a PCN reaction occurring within the last 10 years: No If all of the above answers are "NO", then may proceed with Cephalosporin use.  Marland Kitchen Shellfish-Derived Products Other (See Comments)    From allergy test  . Strawberry Extract Other (See Comments)    Worsened allergy symptoms     Outpatient Medications Prior to Visit  Medication Sig Dispense Refill  . acetaminophen (TYLENOL) 325 MG tablet Take 2 tablets (650 mg total) by mouth every 6 (six) hours as needed for headache.    Marland Kitchen amLODipine (NORVASC) 10 MG tablet Take 1 tablet by mouth once daily (Patient taking differently: Take 10 mg by mouth daily.) 90 tablet 0  . Benralizumab (FASENRA PEN) 30 MG/ML SOAJ Inject into the skin every 8 (eight) weeks.    . budesonide-formoterol (SYMBICORT) 80-4.5 MCG/ACT inhaler Inhale 2 puffs into the lungs  2 (two) times daily. Use for asthma flares 2 puffs twice a day for 2 weeks or until cough and wheeze free (Patient taking differently: Inhale 2 puffs into the lungs 2 (two) times daily as needed (for asthma flares, for 2 weeks, or until cough and wheeze-free).) 10.2 g 5  . Cholecalciferol (VITAMIN D3) 50 MCG (2000 UT) TABS Take 2,000 Units by mouth daily.    Marland Kitchen EPINEPHrine (EPIPEN 2-PAK) 0.3 mg/0.3 mL IJ SOAJ injection Use as directed for severe allergic reaction (Patient taking differently: Inject 0.3 mg into the muscle as needed for anaphylaxis.) 2 each 1  . ipratropium (ATROVENT) 0.06 % nasal spray Place 2 sprays into both nostrils 3 (three) times daily. (Patient taking differently: Place 1 spray into both nostrils 3 (three) times daily as needed (allergies.).) 15 mL 3  . iron polysaccharides (FERREX 150) 150 MG capsule Take 1 capsule (150 mg total) by mouth daily. (Patient not taking: Reported on 06/09/2020) 90 capsule 1  . LINZESS 72 MCG capsule TAKE 1 CAPSULE BY MOUTH ONCE DAILY AS NEEDED (Patient taking differently: Take 72 mcg by mouth daily as needed (for constipation).) 30 capsule 0  . loratadine (CLARITIN)  10 MG tablet Take 10 mg by mouth daily.    Marland Kitchen losartan (COZAAR) 100 MG tablet Take 1 tablet (100 mg total) by mouth daily. 90 tablet 3  . Magnesium Hydroxide (PHILLIPS MILK OF MAGNESIA PO) Take 15-30 mLs by mouth daily as needed (for constipation).    Marland Kitchen omeprazole (PRILOSEC) 40 MG capsule TAKE 1 CAPSULE DAILY FOR ACID REFLUX (Patient not taking: Reported on 06/09/2020) 90 capsule 3  . polyethylene glycol powder (GLYCOLAX/MIRALAX) powder Take 17 g by mouth daily as needed for mild constipation (MIX AND DRINK).    . pravastatin (PRAVACHOL) 80 MG tablet Take 1 tablet (80 mg total) by mouth daily. 90 tablet 3  . traMADol (ULTRAM) 50 MG tablet Take 1 tablet (50 mg total) by mouth every 6 (six) hours as needed for moderate pain. 15 tablet 0  . vitamin B-12 (CYANOCOBALAMIN) 1000 MCG tablet Take 1,000 mcg by mouth daily.    . montelukast (SINGULAIR) 10 MG tablet Take 1 tablet (10 mg total) by mouth at bedtime. (Patient taking differently: Take 10 mg by mouth at bedtime as needed (for flares).) 90 tablet 3  . pravastatin (PRAVACHOL) 40 MG tablet Take 40 mg by mouth daily.    . traMADol (ULTRAM) 50 MG tablet Take 1 tablet (50 mg total) by mouth every 6 (six) hours as needed. 120 tablet 2   Facility-Administered Medications Prior to Visit  Medication Dose Route Frequency Provider Last Rate Last Admin  . Benralizumab SOSY 30 mg  30 mg Subcutaneous Q8 Weeks Kennith Gain, MD   30 mg at 05/03/20 9628    Review of Systems  Constitutional: Positive for weight loss. Negative for chills, fever and malaise/fatigue.  HENT: Negative for hearing loss, sore throat and tinnitus.   Eyes: Negative for blurred vision and double vision.  Respiratory: Positive for shortness of breath. Negative for cough, hemoptysis, sputum production, wheezing and stridor.   Cardiovascular: Negative for chest pain, palpitations, orthopnea, leg swelling and PND.  Gastrointestinal: Positive for abdominal pain. Negative for  constipation, diarrhea, heartburn, nausea and vomiting.  Genitourinary: Negative for dysuria, hematuria and urgency.  Musculoskeletal: Negative for joint pain and myalgias.  Skin: Negative for itching and rash.  Neurological: Negative for dizziness, tingling, weakness and headaches.  Endo/Heme/Allergies: Negative for environmental allergies. Does not bruise/bleed easily.  Psychiatric/Behavioral: Negative for depression. The patient is not nervous/anxious and does not have insomnia.   All other systems reviewed and are negative.    Objective:  Physical Exam Vitals reviewed.  Constitutional:      General: She is not in acute distress.    Appearance: She is well-developed and well-nourished.     Comments: Elderly frail-appearing  HENT:     Head: Normocephalic and atraumatic.     Mouth/Throat:     Mouth: Oropharynx is clear and moist.  Eyes:     General: No scleral icterus.    Conjunctiva/sclera: Conjunctivae normal.     Pupils: Pupils are equal, round, and reactive to light.  Neck:     Vascular: No JVD.     Trachea: No tracheal deviation.  Cardiovascular:     Rate and Rhythm: Normal rate and regular rhythm.     Pulses: Intact distal pulses.     Heart sounds: Normal heart sounds. No murmur heard.   Pulmonary:     Effort: Pulmonary effort is normal. No tachypnea, accessory muscle usage or respiratory distress.     Breath sounds: Normal breath sounds. No stridor. No wheezing, rhonchi or rales.  Abdominal:     General: Bowel sounds are normal. There is no distension.     Palpations: Abdomen is soft.     Tenderness: There is no abdominal tenderness.  Musculoskeletal:        General: No tenderness or edema.     Cervical back: Neck supple.  Lymphadenopathy:     Cervical: No cervical adenopathy.  Skin:    General: Skin is warm and dry.     Capillary Refill: Capillary refill takes less than 2 seconds.     Findings: No rash.  Neurological:     Mental Status: She is alert and  oriented to person, place, and time.  Psychiatric:        Mood and Affect: Mood and affect normal.        Behavior: Behavior normal.      Vitals:   06/09/20 1333  BP: 118/70  Pulse: 82  Temp: 98.2 F (36.8 C)  TempSrc: Tympanic  SpO2: 98%  Weight: 171 lb 4 oz (77.7 kg)  Height: 5\' 5"  (1.651 m)   98% on RA BMI Readings from Last 3 Encounters:  06/09/20 28.50 kg/m  05/19/20 29.20 kg/m  05/17/20 29.95 kg/m   Wt Readings from Last 3 Encounters:  06/09/20 171 lb 4 oz (77.7 kg)  05/19/20 175 lb 7.8 oz (79.6 kg)  05/17/20 180 lb (81.6 kg)     CBC    Component Value Date/Time   WBC 12.8 (H) 05/30/2020 0340   RBC 3.83 (L) 05/30/2020 0340   HGB 11.3 (L) 05/30/2020 0340   HGB 13.1 04/09/2017 1721   HCT 35.0 (L) 05/30/2020 0340   HCT 39.8 04/09/2017 1721   PLT 253 05/30/2020 0340   PLT 355 04/09/2017 1721   MCV 91.4 05/30/2020 0340   MCV 91 04/09/2017 1721   MCH 29.5 05/30/2020 0340   MCHC 32.3 05/30/2020 0340   RDW 13.5 05/30/2020 0340   RDW 14.0 04/09/2017 1721   LYMPHSABS 2.5 05/30/2020 0340   LYMPHSABS 3.6 (H) 04/09/2017 1721   MONOABS 1.3 (H) 05/30/2020 0340   EOSABS 0.0 05/30/2020 0340   EOSABS 0.7 (H) 04/09/2017 1721   BASOSABS 0.0 05/30/2020 0340   BASOSABS 0.0 04/09/2017 1721    Chest Imaging: CT chest 2020-05-17: Patient found to have left lower lobe 3 cm  lung mass with associated mediastinal adenopathy concerning for primary bronchogenic carcinoma and advanced age. Images reviewed today in the office.The patient's images have been independently reviewed by me.     Pulmonary Functions Testing Results: No flowsheet data found.  FeNO:   Pathology:   Echocardiogram:   Heart Catheterization:    Assessment & Plan:     ICD-10-CM   1. Lung mass  R91.8 Ambulatory referral to Pulmonology    Procedural/ Surgical Case Request: VIDEO BRONCHOSCOPY WITH ENDOBRONCHIAL NAVIGATION    NM PET Image Initial (PI) Skull Base To Thigh  2. Adenopathy  R59.9  Procedural/ Surgical Case Request: VIDEO BRONCHOSCOPY WITH ENDOBRONCHIAL NAVIGATION    NM PET Image Initial (PI) Skull Base To Thigh  3. 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  Plan: We discussed the risk benefits and alternatives of proceeding with navigational bronchoscopy for biopsy of the primary lung mass plus mediastinal staging with endobronchial ultrasound and transbronchial needle aspiration. Patient's daughter was present for this discussion We discussed the risk of bleeding and pneumothorax. Patient is not on any blood thinners or antiplatelets.  We will plan for bronchoscopy tomorrow. Nothing to eat past midnight. Patient will [facility] to [facility] endoscopy tomorrow for case. We will arrange preoperative COVID testing.   Current Outpatient Medications:  .  acetaminophen (TYLENOL) 325 MG tablet, Take 2 tablets (650 mg total) by mouth every 6 (six) hours as needed for headache., Disp: , Rfl:  .  amLODipine (NORVASC) 10 MG tablet, Take 1 tablet by mouth once daily (Patient taking differently: Take 10 mg by mouth daily.), Disp: 90 tablet, Rfl: 0 .  Benralizumab (FASENRA PEN) 30 MG/ML SOAJ, Inject into the skin every 8 (eight) weeks., Disp: , Rfl:  .  budesonide-formoterol (SYMBICORT) 80-4.5 MCG/ACT inhaler, Inhale 2 puffs into the lungs 2 (two) times daily. Use for asthma flares 2 puffs twice a day for 2 weeks or until cough and wheeze free (Patient taking differently: Inhale 2 puffs into the lungs 2 (two) times daily as needed (for asthma flares, for 2 weeks, or until cough and wheeze-free).), Disp: 10.2 g, Rfl: 5 .  Cholecalciferol (VITAMIN D3) 50 MCG (2000 UT) TABS, Take 2,000 Units by mouth daily., Disp: , Rfl:  .  EPINEPHrine (EPIPEN  2-PAK) 0.3 mg/0.3 mL IJ SOAJ injection, Use as directed for severe allergic reaction (Patient taking differently: Inject 0.3 mg into the muscle as needed for anaphylaxis.), Disp: 2 each, Rfl: 1 .  ipratropium (ATROVENT) 0.06 % nasal spray, Place 2 sprays into both nostrils 3 (three) times daily. (Patient taking differently: Place 1 spray into both nostrils 3 (three) times daily as needed (allergies.).), Disp: 15 mL, Rfl: 3 .  iron polysaccharides (FERREX 150) 150 MG capsule, Take 1 capsule (150 mg total) by mouth daily. (Patient not taking: Reported on 06/09/2020), Disp: 90 capsule, Rfl: 1 .  LINZESS 72 MCG capsule, TAKE 1 CAPSULE BY MOUTH ONCE DAILY AS NEEDED (Patient taking differently: Take 72 mcg by mouth daily as needed (for constipation).), Disp: 30 capsule, Rfl: 0 .  loratadine (CLARITIN) 10 MG tablet, Take 10 mg by mouth daily., Disp: , Rfl:  .  losartan (COZAAR) 100 MG tablet, Take 1 tablet (100 mg total) by mouth daily., Disp: 90 tablet, Rfl: 3 .  Magnesium Hydroxide (PHILLIPS MILK OF MAGNESIA PO), Take 15-30 mLs by mouth daily as needed (for constipation)., Disp: , Rfl:  .  omeprazole (PRILOSEC) 40 MG capsule, TAKE 1 CAPSULE DAILY FOR ACID REFLUX (Patient not taking: Reported on 06/09/2020), Disp: 90 capsule, Rfl: 3 .  polyethylene glycol powder (GLYCOLAX/MIRALAX) powder, Take 17 g by mouth daily as needed for mild constipation (MIX AND DRINK)., Disp: , Rfl:  .  pravastatin (PRAVACHOL) 80 MG tablet, Take 1 tablet (80 mg total) by mouth daily., Disp: 90 tablet, Rfl: 3 .  traMADol (ULTRAM) 50 MG tablet, Take 1 tablet (50 mg total) by mouth every 6 (six) hours as needed for moderate pain., Disp: 15 tablet, Rfl: 0 .  vitamin B-12 (CYANOCOBALAMIN) 1000 MCG tablet, Take 1,000 mcg by mouth daily., Disp: , Rfl:   Current Facility-Administered Medications:  .  Benralizumab SOSY 30 mg, 30 mg, Subcutaneous, Q8 Weeks, Padgett, Rae Halsted, MD, 30 mg at 05/03/20 0947  I spent 62 minutes dedicated to  the care of this patient on the date of this encounter to include pre-visit review of records, face-to-face time with the patient discussing conditions above, post visit ordering of testing, clinical documentation with the electronic health record, making appropriate referrals as documented, and communicating necessary findings to members of the patients care team.   Garner Nash, DO [facility] 06/09/2020 3:04 PM

## 2020-06-09 NOTE — H&P (View-Only) (Signed)
Synopsis: Referred in January 2022 for lung mass by Angelica Borg, MD  Subjective:   PATIENT ID: Angelica [name] GENDER: female DOB: [birth year], MRN: [id number]  Chief Complaint  Patient presents with  . Consult    Lung mass.  Pt stated that she is not having any SHOB but is having pain    This is an 80 year old female with a past medical history of bowel obstruction, hyperlipidemia, hypertension, thyroid disease, asthma, allergies on Fasenra [facility].  Patient had a recent hospitalization, found to be COVID-positive, both vaccines plus booster.  She had a small bowel obstruction with abdominal pain nausea vomiting, incidental COVID infection.  Seen by general surgery underwent laparoscopy with lysis of adhesions during hospitalization had to be started on TPN.  Ultimately was discharged home.  During this evaluation patient found to have a left-sided lower lobe lung mass with associated mediastinal adenopathy.  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  Patient was referred at the time of discharge from the hospital for evaluation by pulmonary medicine for consideration of tissue diagnosis and biopsy after recovery from COVID-19.  At this time patient has no significant respiratory complaints has been doing well at home.  Patient accompanied today in the office by patient's daughter.  They are in agreements for consideration for tissue biopsy and bronchoscopy.  Patient denies fevers chills, denies hemoptysis.  She has been losing weight she does have poor appetite.   Past Medical History:  Diagnosis Date  . Arthritis   . Asthma   . Bowel obstruction ([place])   . Environmental allergies   . GERD (gastroesophageal reflux disease)   . Glaucoma 05/09/2017  . H. pylori infection   . HLD (hyperlipidemia)  01/16/2019  . Hypertension   . Iron deficiency anemia   . Osteopenia 05/26/2017  . Thyroid disease   . Urticaria 07/25/2018     Family History  Problem Relation Age of Onset  . Diabetes Mother   . Hypertension Mother   . Hypertension Father   . Glaucoma Sister   . Glaucoma Brother   . Allergic rhinitis Neg Hx   . Angioedema Neg Hx   . Asthma Neg Hx   . Eczema Neg Hx   . Immunodeficiency Neg Hx   . Urticaria Neg Hx      Past Surgical History:  Procedure Laterality Date  . ABDOMINAL HYSTERECTOMY    . BOWEL RESECTION N/A 05/23/2020   Procedure: SMALL BOWEL REPAIR;  Surgeon: Kinsinger, Arta Bruce, MD;  Location: [place];  Service: General;  Laterality: N/A;  . BREAST BIOPSY    . COLON SURGERY    . DIAGNOSTIC LARYNGOSCOPY N/A 05/23/2020   Procedure: ATTEMPTED DIAGNOSTIC LAPAROSCOPY WITH ADHESIONS;  Surgeon: Kieth Brightly, Arta Bruce, MD;  Location: [place];  Service: General;  Laterality: N/A;  . KNEE SURGERY    . LAPAROTOMY N/A 04/18/2015   Procedure: Exploratory laparotomy with lysis of adhesions, possible bowel resection;  Surgeon: Ralene Ok, MD;  Location: [place];  Service: General;  Laterality: N/A;  . LAPAROTOMY N/A 05/23/2020   Procedure: EXPLORATORY LAPAROTOMY;  Surgeon: Kieth Brightly Arta Bruce, MD;  Location: [place];  Service: General;  Laterality: N/A;  . LYSIS OF ADHESION N/A 05/23/2020   Procedure: LYSIS OF ADHESION;  Surgeon: Kieth Brightly Arta Bruce, MD;  Location: [place];  Service: General;  Laterality: N/A;  . NASAL SINUS SURGERY    . THYROID SURGERY      Social History   Socioeconomic History  . Marital status: Widowed    Spouse name: Not on file  . Number of children: 4  . Years of education: Not on file  . Highest education level: Not on file  Occupational History  . Occupation: retired  Tobacco Use  . Smoking status: Former Smoker    Types: Cigarettes  . Smokeless tobacco: Never Used  Vaping Use  . Vaping Use: Never used  Substance and Sexual Activity  . Alcohol  use: No  . Drug use: No  . Sexual activity: Never  Other Topics Concern  . Not on file  Social History Narrative  . Not on file   Social Determinants of Health   Financial Resource Strain: Low Risk   . Difficulty of Paying Living Expenses: Not hard at all  Food Insecurity: No Food Insecurity  . Worried About Charity fundraiser in the Last Year: Never true  . Ran Out of Food in the Last Year: Never true  Transportation Needs: No Transportation Needs  . Lack of Transportation (Medical): No  . Lack of Transportation (Non-Medical): No  Physical Activity: Sufficiently Active  . Days of Exercise per Week: 5 days  . Minutes of Exercise per Session: 30 min  Stress: No Stress Concern Present  . Feeling of Stress : Not at all  Social Connections: Moderately Integrated  . Frequency of Communication with Friends and Family: More than three times a week  . Frequency of Social Gatherings with Friends and Family: More than three times a week  . Attends Religious Services: More than 4 times per year  . Active Member of Clubs or Organizations: Yes  . Attends Archivist Meetings: More than 4 times per year  . Marital Status: Widowed  Intimate Partner Violence: Not on file     Allergies  Allergen Reactions  . Asa Buff (Mag [Buffered Aspirin] Other (See Comments)    Excessive sweating  . Fish Allergy Other (See Comments)    Unknown reaction, but allergic  . Milk-Related Compounds   . Other Other (See Comments)    Gelcaps; gel-containing capsules; extended-release medication  . Peanut-Containing Drug Products Other (See Comments)    From allergy test  . Penicillins Itching    Has patient had a PCN reaction causing immediate rash, facial/tongue/throat swelling, SOB or lightheadedness with hypotension: No Has patient had a PCN reaction causing severe rash involving mucus membranes or skin necrosis: No Has patient had a PCN reaction that required hospitalization No Has patient had  a PCN reaction occurring within the last 10 years: No If all of the above answers are "NO", then may proceed with Cephalosporin use.  Marland Kitchen Shellfish-Derived Products Other (See Comments)    From allergy test  . Strawberry Extract Other (See Comments)    Worsened allergy symptoms     Outpatient Medications Prior to Visit  Medication Sig Dispense Refill  . acetaminophen (TYLENOL) 325 MG tablet Take 2 tablets (650 mg total) by mouth every 6 (six) hours as needed for headache.    Marland Kitchen amLODipine (NORVASC) 10 MG tablet Take 1 tablet by mouth once daily (Patient taking differently: Take 10 mg by mouth daily.) 90 tablet 0  . Benralizumab (FASENRA PEN) 30 MG/ML SOAJ Inject into the skin every 8 (eight) weeks.    . budesonide-formoterol (SYMBICORT) 80-4.5 MCG/ACT inhaler Inhale 2 puffs into the lungs  2 (two) times daily. Use for asthma flares 2 puffs twice a day for 2 weeks or until cough and wheeze free (Patient taking differently: Inhale 2 puffs into the lungs 2 (two) times daily as needed (for asthma flares, for 2 weeks, or until cough and wheeze-free).) 10.2 g 5  . Cholecalciferol (VITAMIN D3) 50 MCG (2000 UT) TABS Take 2,000 Units by mouth daily.    Marland Kitchen EPINEPHrine (EPIPEN 2-PAK) 0.3 mg/0.3 mL IJ SOAJ injection Use as directed for severe allergic reaction (Patient taking differently: Inject 0.3 mg into the muscle as needed for anaphylaxis.) 2 each 1  . ipratropium (ATROVENT) 0.06 % nasal spray Place 2 sprays into both nostrils 3 (three) times daily. (Patient taking differently: Place 1 spray into both nostrils 3 (three) times daily as needed (allergies.).) 15 mL 3  . iron polysaccharides (FERREX 150) 150 MG capsule Take 1 capsule (150 mg total) by mouth daily. (Patient not taking: Reported on 06/09/2020) 90 capsule 1  . LINZESS 72 MCG capsule TAKE 1 CAPSULE BY MOUTH ONCE DAILY AS NEEDED (Patient taking differently: Take 72 mcg by mouth daily as needed (for constipation).) 30 capsule 0  . loratadine (CLARITIN)  10 MG tablet Take 10 mg by mouth daily.    Marland Kitchen losartan (COZAAR) 100 MG tablet Take 1 tablet (100 mg total) by mouth daily. 90 tablet 3  . Magnesium Hydroxide (PHILLIPS MILK OF MAGNESIA PO) Take 15-30 mLs by mouth daily as needed (for constipation).    Marland Kitchen omeprazole (PRILOSEC) 40 MG capsule TAKE 1 CAPSULE DAILY FOR ACID REFLUX (Patient not taking: Reported on 06/09/2020) 90 capsule 3  . polyethylene glycol powder (GLYCOLAX/MIRALAX) powder Take 17 g by mouth daily as needed for mild constipation (MIX AND DRINK).    . pravastatin (PRAVACHOL) 80 MG tablet Take 1 tablet (80 mg total) by mouth daily. 90 tablet 3  . traMADol (ULTRAM) 50 MG tablet Take 1 tablet (50 mg total) by mouth every 6 (six) hours as needed for moderate pain. 15 tablet 0  . vitamin B-12 (CYANOCOBALAMIN) 1000 MCG tablet Take 1,000 mcg by mouth daily.    . montelukast (SINGULAIR) 10 MG tablet Take 1 tablet (10 mg total) by mouth at bedtime. (Patient taking differently: Take 10 mg by mouth at bedtime as needed (for flares).) 90 tablet 3  . pravastatin (PRAVACHOL) 40 MG tablet Take 40 mg by mouth daily.    . traMADol (ULTRAM) 50 MG tablet Take 1 tablet (50 mg total) by mouth every 6 (six) hours as needed. 120 tablet 2   Facility-Administered Medications Prior to Visit  Medication Dose Route Frequency Provider Last Rate Last Admin  . Benralizumab SOSY 30 mg  30 mg Subcutaneous Q8 Weeks Kennith Gain, MD   30 mg at 05/03/20 3903    Review of Systems  Constitutional: Positive for weight loss. Negative for chills, fever and malaise/fatigue.  HENT: Negative for hearing loss, sore throat and tinnitus.   Eyes: Negative for blurred vision and double vision.  Respiratory: Positive for shortness of breath. Negative for cough, hemoptysis, sputum production, wheezing and stridor.   Cardiovascular: Negative for chest pain, palpitations, orthopnea, leg swelling and PND.  Gastrointestinal: Positive for abdominal pain. Negative for  constipation, diarrhea, heartburn, nausea and vomiting.  Genitourinary: Negative for dysuria, hematuria and urgency.  Musculoskeletal: Negative for joint pain and myalgias.  Skin: Negative for itching and rash.  Neurological: Negative for dizziness, tingling, weakness and headaches.  Endo/Heme/Allergies: Negative for environmental allergies. Does not bruise/bleed easily.  Psychiatric/Behavioral: Negative for depression. The patient is not nervous/anxious and does not have insomnia.   All other systems reviewed and are negative.    Objective:  Physical Exam Vitals reviewed.  Constitutional:      General: She is not in acute distress.    Appearance: She is well-developed and well-nourished.     Comments: Elderly frail-appearing  HENT:     Head: Normocephalic and atraumatic.     Mouth/Throat:     Mouth: Oropharynx is clear and moist.  Eyes:     General: No scleral icterus.    Conjunctiva/sclera: Conjunctivae normal.     Pupils: Pupils are equal, round, and reactive to light.  Neck:     Vascular: No JVD.     Trachea: No tracheal deviation.  Cardiovascular:     Rate and Rhythm: Normal rate and regular rhythm.     Pulses: Intact distal pulses.     Heart sounds: Normal heart sounds. No murmur heard.   Pulmonary:     Effort: Pulmonary effort is normal. No tachypnea, accessory muscle usage or respiratory distress.     Breath sounds: Normal breath sounds. No stridor. No wheezing, rhonchi or rales.  Abdominal:     General: Bowel sounds are normal. There is no distension.     Palpations: Abdomen is soft.     Tenderness: There is no abdominal tenderness.  Musculoskeletal:        General: No tenderness or edema.     Cervical back: Neck supple.  Lymphadenopathy:     Cervical: No cervical adenopathy.  Skin:    General: Skin is warm and dry.     Capillary Refill: Capillary refill takes less than 2 seconds.     Findings: No rash.  Neurological:     Mental Status: She is alert and  oriented to person, place, and time.  Psychiatric:        Mood and Affect: Mood and affect normal.        Behavior: Behavior normal.      Vitals:   06/09/20 1333  BP: 118/70  Pulse: 82  Temp: 98.2 F (36.8 C)  TempSrc: Tympanic  SpO2: 98%  Weight: 171 lb 4 oz (77.7 kg)  Height: 5\' 5"  (1.651 m)   98% on RA BMI Readings from Last 3 Encounters:  06/09/20 28.50 kg/m  05/19/20 29.20 kg/m  05/17/20 29.95 kg/m   Wt Readings from Last 3 Encounters:  06/09/20 171 lb 4 oz (77.7 kg)  05/19/20 175 lb 7.8 oz (79.6 kg)  05/17/20 180 lb (81.6 kg)     CBC    Component Value Date/Time   WBC 12.8 (H) 05/30/2020 0340   RBC 3.83 (L) 05/30/2020 0340   HGB 11.3 (L) 05/30/2020 0340   HGB 13.1 04/09/2017 1721   HCT 35.0 (L) 05/30/2020 0340   HCT 39.8 04/09/2017 1721   PLT 253 05/30/2020 0340   PLT 355 04/09/2017 1721   MCV 91.4 05/30/2020 0340   MCV 91 04/09/2017 1721   MCH 29.5 05/30/2020 0340   MCHC 32.3 05/30/2020 0340   RDW 13.5 05/30/2020 0340   RDW 14.0 04/09/2017 1721   LYMPHSABS 2.5 05/30/2020 0340   LYMPHSABS 3.6 (H) 04/09/2017 1721   MONOABS 1.3 (H) 05/30/2020 0340   EOSABS 0.0 05/30/2020 0340   EOSABS 0.7 (H) 04/09/2017 1721   BASOSABS 0.0 05/30/2020 0340   BASOSABS 0.0 04/09/2017 1721    Chest Imaging: CT chest 2020-05-17: Patient found to have left lower lobe 3 cm  lung mass with associated mediastinal adenopathy concerning for primary bronchogenic carcinoma and advanced age. Images reviewed today in the office.The patient's images have been independently reviewed by me.     Pulmonary Functions Testing Results: No flowsheet data found.  FeNO:   Pathology:   Echocardiogram:   Heart Catheterization:    Assessment & Plan:     ICD-10-CM   1. Lung mass  R91.8 Ambulatory referral to Pulmonology    Procedural/ Surgical Case Request: VIDEO BRONCHOSCOPY WITH ENDOBRONCHIAL NAVIGATION    NM PET Image Initial (PI) Skull Base To Thigh  2. Adenopathy  R59.9  Procedural/ Surgical Case Request: VIDEO BRONCHOSCOPY WITH ENDOBRONCHIAL NAVIGATION    NM PET Image Initial (PI) Skull Base To Thigh  3. 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  Plan: We discussed the risk benefits and alternatives of proceeding with navigational bronchoscopy for biopsy of the primary lung mass plus mediastinal staging with endobronchial ultrasound and transbronchial needle aspiration. Patient's daughter was present for this discussion We discussed the risk of bleeding and pneumothorax. Patient is not on any blood thinners or antiplatelets.  We will plan for bronchoscopy tomorrow. Nothing to eat past midnight. Patient will [facility] to [facility] endoscopy tomorrow for case. We will arrange preoperative COVID testing.   Current Outpatient Medications:  .  acetaminophen (TYLENOL) 325 MG tablet, Take 2 tablets (650 mg total) by mouth every 6 (six) hours as needed for headache., Disp: , Rfl:  .  amLODipine (NORVASC) 10 MG tablet, Take 1 tablet by mouth once daily (Patient taking differently: Take 10 mg by mouth daily.), Disp: 90 tablet, Rfl: 0 .  Benralizumab (FASENRA PEN) 30 MG/ML SOAJ, Inject into the skin every 8 (eight) weeks., Disp: , Rfl:  .  budesonide-formoterol (SYMBICORT) 80-4.5 MCG/ACT inhaler, Inhale 2 puffs into the lungs 2 (two) times daily. Use for asthma flares 2 puffs twice a day for 2 weeks or until cough and wheeze free (Patient taking differently: Inhale 2 puffs into the lungs 2 (two) times daily as needed (for asthma flares, for 2 weeks, or until cough and wheeze-free).), Disp: 10.2 g, Rfl: 5 .  Cholecalciferol (VITAMIN D3) 50 MCG (2000 UT) TABS, Take 2,000 Units by mouth daily., Disp: , Rfl:  .  EPINEPHrine (EPIPEN  2-PAK) 0.3 mg/0.3 mL IJ SOAJ injection, Use as directed for severe allergic reaction (Patient taking differently: Inject 0.3 mg into the muscle as needed for anaphylaxis.), Disp: 2 each, Rfl: 1 .  ipratropium (ATROVENT) 0.06 % nasal spray, Place 2 sprays into both nostrils 3 (three) times daily. (Patient taking differently: Place 1 spray into both nostrils 3 (three) times daily as needed (allergies.).), Disp: 15 mL, Rfl: 3 .  iron polysaccharides (FERREX 150) 150 MG capsule, Take 1 capsule (150 mg total) by mouth daily. (Patient not taking: Reported on 06/09/2020), Disp: 90 capsule, Rfl: 1 .  LINZESS 72 MCG capsule, TAKE 1 CAPSULE BY MOUTH ONCE DAILY AS NEEDED (Patient taking differently: Take 72 mcg by mouth daily as needed (for constipation).), Disp: 30 capsule, Rfl: 0 .  loratadine (CLARITIN) 10 MG tablet, Take 10 mg by mouth daily., Disp: , Rfl:  .  losartan (COZAAR) 100 MG tablet, Take 1 tablet (100 mg total) by mouth daily., Disp: 90 tablet, Rfl: 3 .  Magnesium Hydroxide (PHILLIPS MILK OF MAGNESIA PO), Take 15-30 mLs by mouth daily as needed (for constipation)., Disp: , Rfl:  .  omeprazole (PRILOSEC) 40 MG capsule, TAKE 1 CAPSULE DAILY FOR ACID REFLUX (Patient not taking: Reported on 06/09/2020), Disp: 90 capsule, Rfl: 3 .  polyethylene glycol powder (GLYCOLAX/MIRALAX) powder, Take 17 g by mouth daily as needed for mild constipation (MIX AND DRINK)., Disp: , Rfl:  .  pravastatin (PRAVACHOL) 80 MG tablet, Take 1 tablet (80 mg total) by mouth daily., Disp: 90 tablet, Rfl: 3 .  traMADol (ULTRAM) 50 MG tablet, Take 1 tablet (50 mg total) by mouth every 6 (six) hours as needed for moderate pain., Disp: 15 tablet, Rfl: 0 .  vitamin B-12 (CYANOCOBALAMIN) 1000 MCG tablet, Take 1,000 mcg by mouth daily., Disp: , Rfl:   Current Facility-Administered Medications:  .  Benralizumab SOSY 30 mg, 30 mg, Subcutaneous, Q8 Weeks, Padgett, Rae Halsted, MD, 30 mg at 05/03/20 0947  I spent 62 minutes dedicated to  the care of this patient on the date of this encounter to include pre-visit review of records, face-to-face time with the patient discussing conditions above, post visit ordering of testing, clinical documentation with the electronic health record, making appropriate referrals as documented, and communicating necessary findings to members of the patients care team.   Garner Nash, DO [facility] 06/09/2020 3:04 PM

## 2020-06-09 NOTE — Telephone Encounter (Signed)
Oh yes, please take all medicaiton as prescribed on her list now

## 2020-06-09 NOTE — Telephone Encounter (Signed)
Patients daughter called and was wondering if the patient should still be taking losartan (COZAAR) 100 MG tablet and amLODipine (NORVASC) 10 MG tablet. She said that the patients BP has been 160/96 and 150/95. She can be reached at [phone]. Patients daughter said she was going to give her the losartan (COZAAR) 100 MG tablet, she said that the patient has not taken it in a month.

## 2020-06-09 NOTE — Progress Notes (Signed)
Patient tested positive for COVID on 05/17/20, no retest needed within 90 days of procedure.

## 2020-06-10 ENCOUNTER — Ambulatory Visit: Payer: Medicare HMO | Admitting: Internal Medicine

## 2020-06-10 ENCOUNTER — Ambulatory Visit (HOSPITAL_COMMUNITY)
Admission: RE | Admit: 2020-06-10 | Discharge: 2020-06-10 | Disposition: A | Discharge status: Home / Self Care | Payer: Medicare HMO | Attending: Pulmonary Disease | Admitting: Pulmonary Disease

## 2020-06-10 ENCOUNTER — Other Ambulatory Visit: Payer: Self-pay

## 2020-06-10 ENCOUNTER — Ambulatory Visit (HOSPITAL_COMMUNITY): Payer: Medicare HMO

## 2020-06-10 ENCOUNTER — Ambulatory Visit (HOSPITAL_COMMUNITY): Payer: Medicare HMO | Admitting: Anesthesiology

## 2020-06-10 ENCOUNTER — Telehealth: Payer: Self-pay | Admitting: Pulmonary Disease

## 2020-06-10 ENCOUNTER — Encounter (HOSPITAL_COMMUNITY): Payer: Self-pay | Admitting: Pulmonary Disease

## 2020-06-10 ENCOUNTER — Encounter: Payer: Self-pay | Admitting: *Deleted

## 2020-06-10 ENCOUNTER — Telehealth: Payer: Self-pay | Admitting: Internal Medicine

## 2020-06-10 ENCOUNTER — Encounter (HOSPITAL_COMMUNITY)
Admission: RE | Disposition: A | Discharge status: Home / Self Care | Payer: Self-pay | Source: Home / Self Care | Attending: Pulmonary Disease

## 2020-06-10 DIAGNOSIS — Z83511 Family history of glaucoma: Secondary | ICD-10-CM | POA: Insufficient documentation

## 2020-06-10 DIAGNOSIS — R911 Solitary pulmonary nodule: Secondary | ICD-10-CM | POA: Diagnosis not present

## 2020-06-10 DIAGNOSIS — Z833 Family history of diabetes mellitus: Secondary | ICD-10-CM | POA: Insufficient documentation

## 2020-06-10 DIAGNOSIS — Z87891 Personal history of nicotine dependence: Secondary | ICD-10-CM | POA: Diagnosis not present

## 2020-06-10 DIAGNOSIS — Z880 Allergy status to penicillin: Secondary | ICD-10-CM | POA: Insufficient documentation

## 2020-06-10 DIAGNOSIS — R918 Other nonspecific abnormal finding of lung field: Secondary | ICD-10-CM | POA: Diagnosis not present

## 2020-06-10 DIAGNOSIS — J4550 Severe persistent asthma, uncomplicated: Secondary | ICD-10-CM | POA: Diagnosis not present

## 2020-06-10 DIAGNOSIS — J45909 Unspecified asthma, uncomplicated: Secondary | ICD-10-CM | POA: Insufficient documentation

## 2020-06-10 DIAGNOSIS — R599 Enlarged lymph nodes, unspecified: Secondary | ICD-10-CM | POA: Diagnosis present

## 2020-06-10 DIAGNOSIS — C3432 Malignant neoplasm of lower lobe, left bronchus or lung: Secondary | ICD-10-CM | POA: Diagnosis not present

## 2020-06-10 DIAGNOSIS — Z8616 Personal history of COVID-19: Secondary | ICD-10-CM | POA: Insufficient documentation

## 2020-06-10 DIAGNOSIS — Z8249 Family history of ischemic heart disease and other diseases of the circulatory system: Secondary | ICD-10-CM | POA: Diagnosis not present

## 2020-06-10 DIAGNOSIS — Z91010 Allergy to peanuts: Secondary | ICD-10-CM | POA: Diagnosis not present

## 2020-06-10 DIAGNOSIS — Z91011 Allergy to milk products: Secondary | ICD-10-CM | POA: Diagnosis not present

## 2020-06-10 DIAGNOSIS — Z79899 Other long term (current) drug therapy: Secondary | ICD-10-CM | POA: Diagnosis not present

## 2020-06-10 DIAGNOSIS — E876 Hypokalemia: Secondary | ICD-10-CM | POA: Diagnosis not present

## 2020-06-10 DIAGNOSIS — Z9889 Other specified postprocedural states: Secondary | ICD-10-CM

## 2020-06-10 DIAGNOSIS — Z91013 Allergy to seafood: Secondary | ICD-10-CM | POA: Diagnosis not present

## 2020-06-10 DIAGNOSIS — C3491 Malignant neoplasm of unspecified part of right bronchus or lung: Secondary | ICD-10-CM | POA: Diagnosis not present

## 2020-06-10 DIAGNOSIS — C771 Secondary and unspecified malignant neoplasm of intrathoracic lymph nodes: Secondary | ICD-10-CM | POA: Diagnosis not present

## 2020-06-10 DIAGNOSIS — Z91018 Allergy to other foods: Secondary | ICD-10-CM | POA: Insufficient documentation

## 2020-06-10 DIAGNOSIS — R846 Abnormal cytological findings in specimens from respiratory organs and thorax: Secondary | ICD-10-CM | POA: Diagnosis not present

## 2020-06-10 DIAGNOSIS — R590 Localized enlarged lymph nodes: Secondary | ICD-10-CM | POA: Diagnosis not present

## 2020-06-10 HISTORY — PX: VIDEO BRONCHOSCOPY WITH ENDOBRONCHIAL ULTRASOUND: SHX6177

## 2020-06-10 HISTORY — PX: BRONCHIAL NEEDLE ASPIRATION BIOPSY: SHX5106

## 2020-06-10 HISTORY — PX: VIDEO BRONCHOSCOPY WITH ENDOBRONCHIAL NAVIGATION: SHX6175

## 2020-06-10 HISTORY — PX: BRONCHIAL WASHINGS: SHX5105

## 2020-06-10 HISTORY — PX: BRONCHIAL BIOPSY: SHX5109

## 2020-06-10 HISTORY — PX: BRONCHIAL BRUSHINGS: SHX5108

## 2020-06-10 IMAGING — DX DG CHEST 1V PORT
1 series · 1 of 1 positions shown · non-contrast
Comparison: [DATE]

CLINICAL DATA: Status post bronchoscopy, history of left lower lobe
mass lesion.

EXAM:
PORTABLE CHEST 1 VIEW

[chest]
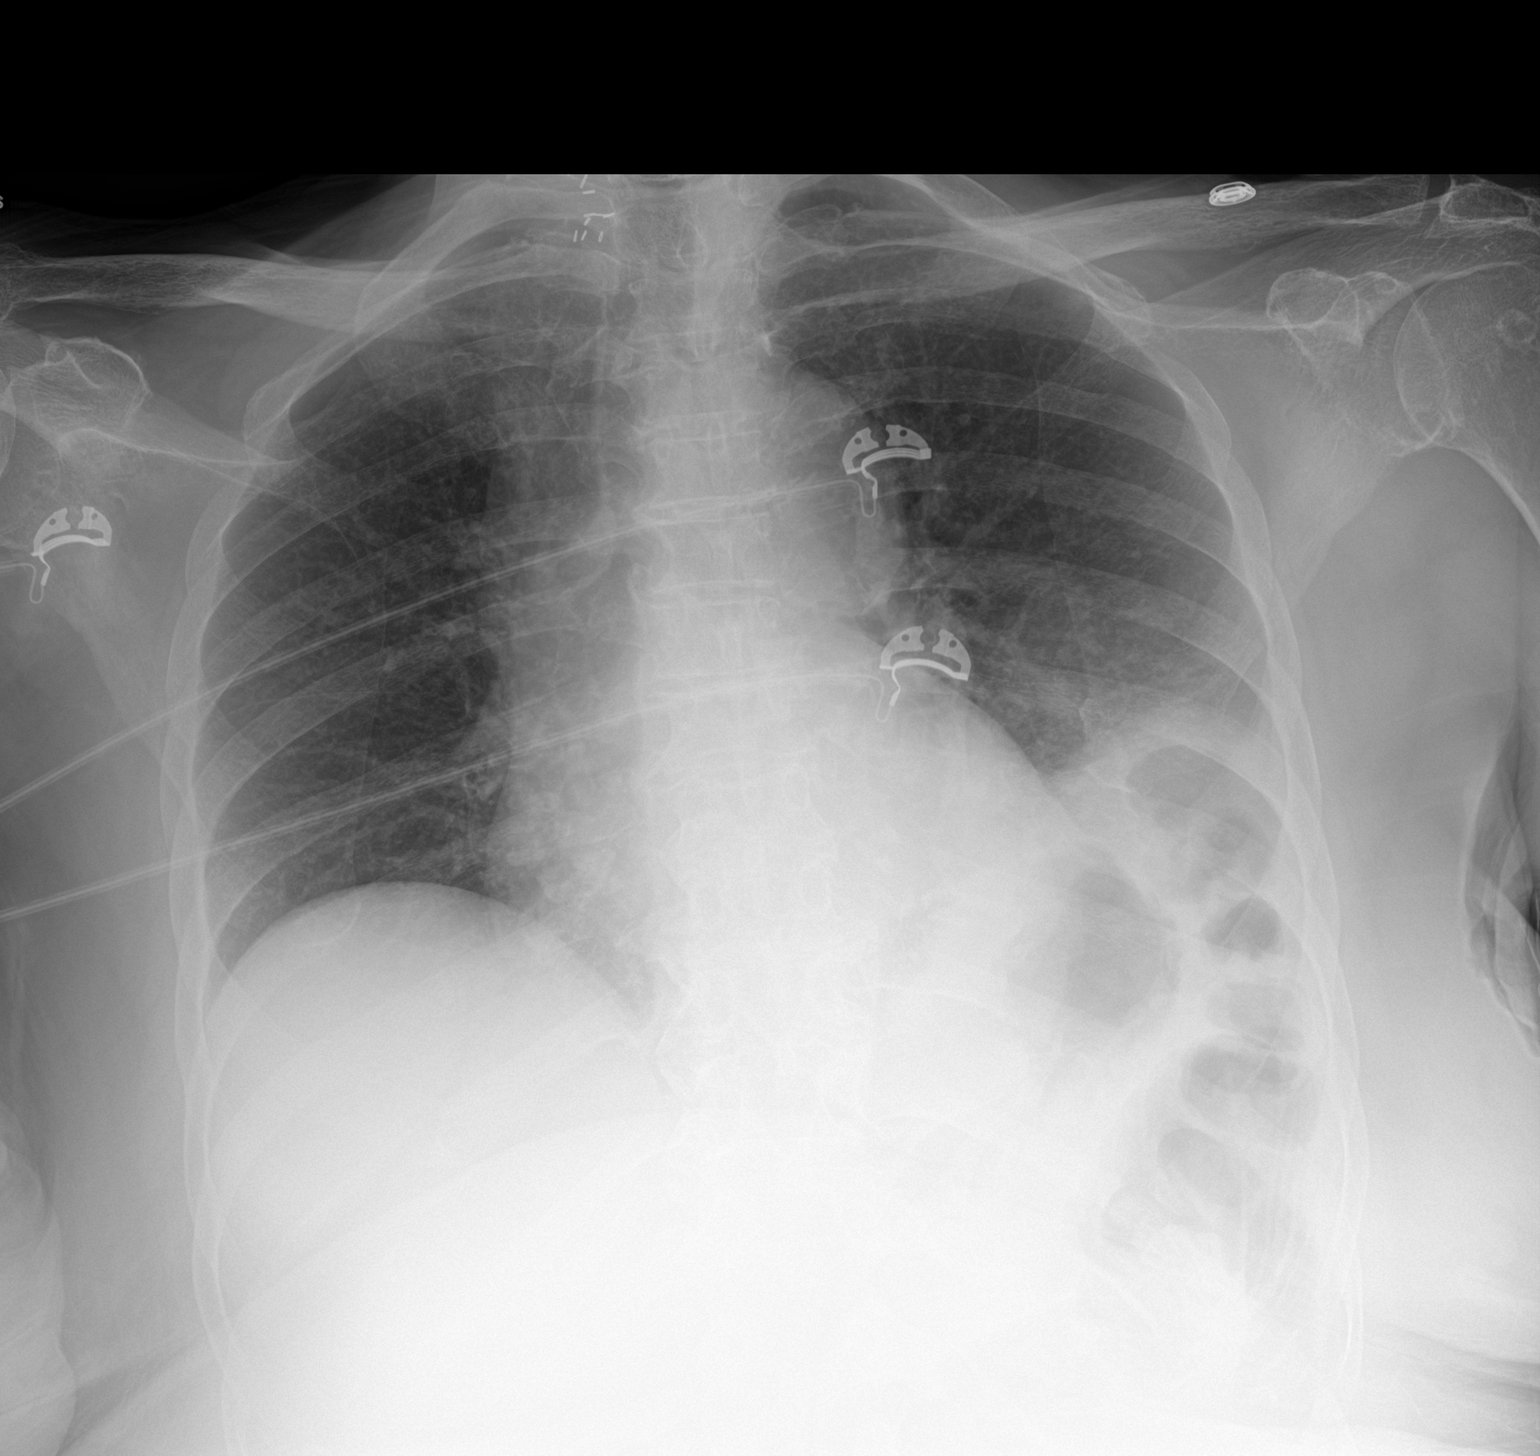

[1 of 1 positions shown; findings below may reference images not displayed]

FINDINGS: Cardiac shadow is stable. Hypoinflation is seen. No pneumothorax is
noted. The known left lower lobe mass lesion is not well appreciated
due to elevation of the left hemidiaphragm. No bony abnormality is
seen.
IMPRESSION: No evidence of post bronchoscopy pneumothorax.

## 2020-06-10 SURGERY — VIDEO BRONCHOSCOPY WITH ENDOBRONCHIAL NAVIGATION
Anesthesia: General | Laterality: Bilateral

## 2020-06-10 MED ORDER — ACETAMINOPHEN 500 MG PO TABS
1000.0000 mg | ORAL_TABLET | Freq: Once | ORAL | Status: AC
  Filled 2020-06-10: qty 2

## 2020-06-10 MED ORDER — CHLORHEXIDINE GLUCONATE 0.12 % MT SOLN
OROMUCOSAL | Status: AC
  Administered 2020-06-10: 15 mL via OROMUCOSAL
  Filled 2020-06-10: qty 15

## 2020-06-10 MED ORDER — ONDANSETRON HCL 4 MG/2ML IJ SOLN
INTRAMUSCULAR | Status: DC | PRN
  Administered 2020-06-10: 4 mg via INTRAVENOUS

## 2020-06-10 MED ORDER — DEXAMETHASONE SODIUM PHOSPHATE 10 MG/ML IJ SOLN
INTRAMUSCULAR | Status: DC | PRN
  Administered 2020-06-10: 10 mg via INTRAVENOUS

## 2020-06-10 MED ORDER — ALBUMIN HUMAN 5 % IV SOLN: INTRAVENOUS | Status: DC | PRN

## 2020-06-10 MED ORDER — PROPOFOL 10 MG/ML IV BOLUS
INTRAVENOUS | Status: DC | PRN
  Administered 2020-06-10: 80 mg via INTRAVENOUS

## 2020-06-10 MED ORDER — ROCURONIUM BROMIDE 10 MG/ML (PF) SYRINGE
PREFILLED_SYRINGE | INTRAVENOUS | Status: DC | PRN
  Administered 2020-06-10: 50 mg via INTRAVENOUS

## 2020-06-10 MED ORDER — ACETAMINOPHEN 500 MG PO TABS
ORAL_TABLET | ORAL | Status: AC
  Administered 2020-06-10: 1000 mg via ORAL
  Filled 2020-06-10: qty 2

## 2020-06-10 MED ORDER — ALBUTEROL SULFATE HFA 108 (90 BASE) MCG/ACT IN AERS
INHALATION_SPRAY | RESPIRATORY_TRACT | Status: DC | PRN
  Administered 2020-06-10: 6 via RESPIRATORY_TRACT
  Administered 2020-06-10: 2 via RESPIRATORY_TRACT

## 2020-06-10 MED ORDER — PHENYLEPHRINE HCL-NACL 10-0.9 MG/250ML-% IV SOLN
INTRAVENOUS | Status: DC | PRN
  Administered 2020-06-10: 30 ug/min via INTRAVENOUS

## 2020-06-10 MED ORDER — CHLORHEXIDINE GLUCONATE 0.12 % MT SOLN
15.0000 mL | Freq: Once | OROMUCOSAL | Status: AC
  Filled 2020-06-10 (×2): qty 15

## 2020-06-10 MED ORDER — SUGAMMADEX SODIUM 200 MG/2ML IV SOLN
INTRAVENOUS | Status: DC | PRN
  Administered 2020-06-10: 200 mg via INTRAVENOUS

## 2020-06-10 MED ORDER — FENTANYL CITRATE (PF) 100 MCG/2ML IJ SOLN
INTRAMUSCULAR | Status: DC | PRN
  Administered 2020-06-10 (×2): 50 ug via INTRAVENOUS

## 2020-06-10 MED ORDER — FENTANYL CITRATE (PF) 100 MCG/2ML IJ SOLN: 25.0000 ug | INTRAMUSCULAR | Status: DC | PRN

## 2020-06-10 MED ORDER — LACTATED RINGERS IV SOLN: INTRAVENOUS | Status: DC

## 2020-06-10 MED ORDER — AMISULPRIDE (ANTIEMETIC) 5 MG/2ML IV SOLN
10.0000 mg | Freq: Once | INTRAVENOUS | Status: DC | PRN
  Filled 2020-06-10: qty 4

## 2020-06-10 MED ORDER — PHENYLEPHRINE 40 MCG/ML (10ML) SYRINGE FOR IV PUSH (FOR BLOOD PRESSURE SUPPORT)
PREFILLED_SYRINGE | INTRAVENOUS | Status: DC | PRN
  Administered 2020-06-10: 80 ug via INTRAVENOUS

## 2020-06-10 MED ORDER — LIDOCAINE 2% (20 MG/ML) 5 ML SYRINGE
INTRAMUSCULAR | Status: DC | PRN
  Administered 2020-06-10: 100 mg via INTRAVENOUS

## 2020-06-10 SURGICAL SUPPLY — 46 items

## 2020-06-10 NOTE — Op Note (Signed)
Video Bronchoscopy with Electromagnetic Navigation Procedure Note Video Bronchoscopy with Endobronchial Ultrasound Procedure Note   Date of Operation: 06/10/2020  Pre-op Diagnosis: Left lower lobe nodule, adenopathy  Post-op Diagnosis: Left lower lobe nodule, adenopathy  Surgeon: Garner Nash, DO   Assistants: None   Anesthesia: General endotracheal anesthesia  Operation: Flexible video fiberoptic bronchoscopy with electromagnetic navigation and biopsies.  Estimated Blood Loss: Minimal  Complications: None   Indications and History: Angelica [name] is a 80 y.o. female with left lower lobe nodule, adenopathy.  The risks, benefits, complications, treatment options and expected outcomes were discussed with the patient.  The possibilities of pneumothorax, pneumonia, reaction to medication, pulmonary aspiration, perforation of a viscus, bleeding, failure to diagnose a condition and creating a complication requiring transfusion or operation were discussed with the patient who freely signed the consent.    Description of Procedure: The patient was seen in the Preoperative Area, was examined and was deemed appropriate to proceed.  The patient was taken to [facility] endoscopy room 2, identified as Angelica [name] and the procedure verified as Flexible Video Fiberoptic Bronchoscopy.  A Time Out was held and the above information confirmed.   Prior to the date of the procedure a high-resolution CT scan of the chest was performed. Utilizing [place] a virtual tracheobronchial tree was generated to allow the creation of distinct navigation pathways to the patient's parenchymal abnormalities. After being taken to the operating room general anesthesia was initiated and the patient  was orally intubated. The video fiberoptic bronchoscope was introduced via the endotracheal tube and a general inspection was performed which showed normal right and left lung anatomy with no evidence of  endobronchial lesion.   Endobronchial navigation, description of procedure: Target #1 left lower lobe The extendable working channel and locator guide were introduced into the bronchoscope.  Fluoroscopic navigation was completed with an inspiratory breath-hold APL 20 cm of water, from RAO 25 degrees to LAO 25 degrees. The distinct navigation pathways prepared prior to this procedure were then utilized to navigate to within 0.8cm of patient's lesion(s) identified on CT scan. The extendable working channel was secured into place and the locator guide was withdrawn. Under fluoroscopic guidance transbronchial needle brushings, transbronchial Wang needle biopsies, and transbronchial forceps biopsies were performed to be sent for cytology and pathology. A bronchioalveolar lavage was performed in the left lower lobe and sent for cytology and microbiology (bacterial, fungal, AFB smears and cultures).   Endobronchial ultrasound description of Procedure: The standard scope was then withdrawn and the endobronchial ultrasound was used to identify and characterize the peritracheal, hilar and bronchial lymph nodes. Inspection showed enlarged subcarinal adenopathy. Using real-time ultrasound guidance Wang needle biopsies were take from Station 7 nodes and were sent for cytology.   Standard bronchoscope was reinserted to the airway and bilateral aspiration of mainstem's removal of blood clots and debris and any remaining secretions.  All distal subsegments of the airways were patent at the termination of the procedure.  Bronchoscope was brought to just above the main carina there was no evidence of active bleeding. The bronchoscope was removed.  The patient tolerated the procedure well. There was no significant blood loss and there were no obvious complications. A post-procedural chest x-ray is pending.  Samples: 1. Transbronchial needle brushings from left lower lobe 2. Transbronchial Wang needle biopsies from left  lower lobe 3. Transbronchial forceps biopsies from left lower lobe 4. Bronchoalveolar lavage from left lower lobe 5. Endobronchial biopsies from left lower lobe  Endobronchial ultrasound samples: 1. Wang needle biopsies from 7 node  Plans:  The patient will be discharged from the PACU to home when recovered from anesthesia and after chest x-ray is reviewed. We will review the cytology, pathology and microbiology results with the patient when they become available. Outpatient followup will be with Garner Nash, DO.   Garner Nash, DO [place] [facility] 06/10/2020 3:05 PM

## 2020-06-10 NOTE — Telephone Encounter (Signed)
Will route to PCC's to make them aware when they go to schedule the MIRI

## 2020-06-10 NOTE — Interval H&P Note (Signed)
History and Physical Interval Note:  06/10/2020 10:41 AM  Angelica [name]  has presented today for surgery, with the diagnosis of lung mass, adenopathy.  The various methods of treatment have been discussed with the patient and family. After consideration of risks, benefits and other options for treatment, the patient has consented to  Procedure(s): [place] (Bilateral) VIDEO BRONCHOSCOPY WITH ENDOBRONCHIAL ULTRASOUND as a surgical intervention.  The patient's history has been reviewed, patient examined, no change in status, stable for surgery.  I have reviewed the patient's chart and labs.  Questions were answered to the patient's satisfaction.     [place]

## 2020-06-10 NOTE — Anesthesia Postprocedure Evaluation (Signed)
Anesthesia Post Note  Patient: Angelica [name]  Procedure(s) Performed: VIDEO BRONCHOSCOPY WITH ENDOBRONCHIAL NAVIGATION (Bilateral ) VIDEO BRONCHOSCOPY WITH ENDOBRONCHIAL ULTRASOUND BRONCHIAL BIOPSIES BRONCHIAL BRUSHINGS BRONCHIAL NEEDLE ASPIRATION BIOPSIES BRONCHIAL WASHINGS     Patient location during evaluation: Endoscopy Anesthesia Type: General Level of consciousness: sedated Pain management: pain level controlled Vital Signs Assessment: post-procedure vital signs reviewed and stable Respiratory status: spontaneous breathing and respiratory function stable Cardiovascular status: stable Postop Assessment: no apparent nausea or vomiting Anesthetic complications: no   No complications documented.  Last Vitals:  Vitals:   06/10/20 1515 06/10/20 1530  BP: 122/64 113/71  Pulse: 81 84  Resp: 20 16  Temp:    SpO2: 97% 99%    Last Pain:  Vitals:   06/10/20 1530  TempSrc:   PainSc: Asleep                 Viana Sleep DANIEL

## 2020-06-10 NOTE — Anesthesia Preprocedure Evaluation (Addendum)
Anesthesia Evaluation  Patient identified by MRN, date of birth, ID band Patient awake    Reviewed: Allergy & Precautions, NPO status , Patient's Chart, lab work & pertinent test results  History of Anesthesia Complications Negative for: history of anesthetic complications  Airway Mallampati: II   Neck ROM: Full    Dental  (+) Poor Dentition, Missing, Partial Lower, Partial Upper, Dental Advisory Given,    Pulmonary asthma , former smoker,  Lung mass   Pulmonary exam normal        Cardiovascular hypertension, Pt. on medications  Rhythm:Regular Rate:Normal + Systolic murmurs    Neuro/Psych negative neurological ROS  negative psych ROS   GI/Hepatic Neg liver ROS, GERD  ,  Endo/Other  negative endocrine ROS  Renal/GU negative Renal ROS  negative genitourinary   Musculoskeletal  (+) Arthritis ,   Abdominal   Peds  Hematology negative hematology ROS (+)   Anesthesia Other Findings Day of surgery medications reviewed with patient.  Reproductive/Obstetrics negative OB ROS                            Anesthesia Physical  Anesthesia Plan  ASA: III  Anesthesia Plan: General   Post-op Pain Management:    Induction: Intravenous, Rapid sequence and Cricoid pressure planned  PONV Risk Score and Plan: 4 or greater and Treatment may vary due to age or medical condition, Ondansetron and Dexamethasone  Airway Management Planned: Oral ETT  Additional Equipment: None  Intra-op Plan:   Post-operative Plan: Extubation in OR  Informed Consent: I have reviewed the patients History and Physical, chart, labs and discussed the procedure including the risks, benefits and alternatives for the proposed anesthesia with the patient or authorized representative who has indicated his/her understanding and acceptance.     Dental advisory given  Plan Discussed with: CRNA and  Anesthesiologist  Anesthesia Plan Comments:        Anesthesia Quick Evaluation

## 2020-06-10 NOTE — Discharge Instructions (Signed)
Flexible Bronchoscopy, Care After This sheet gives you information about how to care for yourself after your test. Your doctor may also give you more specific instructions. If you have problems or questions, contact your doctor. Follow these instructions at home: Eating and drinking  Do not eat or drink anything (not even water) for 2 hours after your test, or until your numbing medicine (local anesthetic) wears off.  When your numbness is gone and your cough and gag reflexes have come back, you may: ? Eat only soft foods. ? Slowly drink liquids.  The day after the test, go back to your normal diet. Driving  Do not drive for 24 hours if you were given a medicine to help you relax (sedative).  Do not drive or use heavy machinery while taking prescription pain medicine. General instructions   Take over-the-counter and prescription medicines only as told by your doctor.  Return to your normal activities as told. Ask what activities are safe for you.  Do not use any products that have nicotine or tobacco in them. This includes cigarettes and e-cigarettes. If you need help quitting, ask your doctor.  Keep all follow-up visits as told by your doctor. This is important. It is very important if you had a tissue sample (biopsy) taken. Get help right away if:  You have shortness of breath that gets worse.  You get light-headed.  You feel like you are going to pass out (faint).  You have chest pain.  You cough up: ? More than a little blood. ? More blood than before. Summary  Do not eat or drink anything (not even water) for 2 hours after your test, or until your numbing medicine wears off.  Do not use cigarettes. Do not use e-cigarettes.  Get help right away if you have chest pain.   This information is not intended to replace advice given to you by your health care provider. Make sure you discuss any questions you have with your health care provider. Document Released:  03/04/2009 Document Revised: 04/19/2017 Document Reviewed: 05/25/2016 Elsevier Patient Education  2020 [employer].

## 2020-06-10 NOTE — Anesthesia Procedure Notes (Signed)
Procedure Name: Intubation Date/Time: 06/10/2020 1:03 PM Performed by: Candis Shine, CRNA Pre-anesthesia Checklist: Patient identified, Emergency Drugs available, Suction available and Patient being monitored Patient Re-evaluated:Patient Re-evaluated prior to induction Oxygen Delivery Method: Circle System Utilized Preoxygenation: Pre-oxygenation with 100% oxygen Induction Type: IV induction Ventilation: Mask ventilation without difficulty Laryngoscope Size: Mac and 3 Grade View: Grade I Tube type: Oral Tube size: 8.5 mm Number of attempts: 1 Airway Equipment and Method: Stylet Placement Confirmation: ETT inserted through vocal cords under direct vision,  positive ETCO2 and breath sounds checked- equal and bilateral Secured at: 23 cm Tube secured with: Tape Dental Injury: Teeth and Oropharynx as per pre-operative assessment

## 2020-06-10 NOTE — Telephone Encounter (Signed)
Received a new pt referral from Dr. Valeta Harms for lung mass. Angelica [name] has been scheduled to see Dr. [name] on 2/2 at 2:15pm w/labs at 1:45pm. I cld and lft the appt date and time on the pt's vm. Letter mailed.

## 2020-06-10 NOTE — Telephone Encounter (Signed)
Patient seen on 06/09/2020 by Dr. Valeta Harms -pr

## 2020-06-10 NOTE — Transfer of Care (Signed)
Immediate Anesthesia Transfer of Care Note  Patient: Angelica [name]  Procedure(s) Performed: VIDEO BRONCHOSCOPY WITH ENDOBRONCHIAL NAVIGATION (Bilateral ) VIDEO BRONCHOSCOPY WITH ENDOBRONCHIAL ULTRASOUND BRONCHIAL BIOPSIES BRONCHIAL BRUSHINGS BRONCHIAL NEEDLE ASPIRATION BIOPSIES BRONCHIAL WASHINGS  Patient Location: PACU  Anesthesia Type:General  Level of Consciousness: awake, alert  and oriented  Airway & Oxygen Therapy: Patient Spontanous Breathing and Patient connected to face mask oxygen  Post-op Assessment: Report given to RN and Post -op Vital signs reviewed and stable  Post vital signs: Reviewed and stable  Last Vitals:  Vitals Value Taken Time  BP    Temp    Pulse    Resp    SpO2      Last Pain:  Vitals:   06/10/20 0938  TempSrc:   PainSc: 0-No pain      Patients Stated Pain Goal: 3 (35/70/17 [id number])  Complications: No complications documented.

## 2020-06-10 NOTE — Progress Notes (Signed)
I received referral on Angelica [name] today.  I updated new patient coordinator to call and schedule her to see Dr. [name]. PET and MRI Brain are ordered but not scheduled at this time.

## 2020-06-11 DIAGNOSIS — M19041 Primary osteoarthritis, right hand: Secondary | ICD-10-CM | POA: Diagnosis not present

## 2020-06-11 DIAGNOSIS — R918 Other nonspecific abnormal finding of lung field: Secondary | ICD-10-CM | POA: Diagnosis not present

## 2020-06-11 DIAGNOSIS — K565 Intestinal adhesions [bands], unspecified as to partial versus complete obstruction: Secondary | ICD-10-CM | POA: Diagnosis not present

## 2020-06-11 DIAGNOSIS — M170 Bilateral primary osteoarthritis of knee: Secondary | ICD-10-CM | POA: Diagnosis not present

## 2020-06-11 DIAGNOSIS — U071 COVID-19: Secondary | ICD-10-CM | POA: Diagnosis not present

## 2020-06-11 DIAGNOSIS — I10 Essential (primary) hypertension: Secondary | ICD-10-CM | POA: Diagnosis not present

## 2020-06-11 DIAGNOSIS — E049 Nontoxic goiter, unspecified: Secondary | ICD-10-CM | POA: Diagnosis not present

## 2020-06-11 DIAGNOSIS — J4550 Severe persistent asthma, uncomplicated: Secondary | ICD-10-CM | POA: Diagnosis not present

## 2020-06-11 DIAGNOSIS — Z48815 Encounter for surgical aftercare following surgery on the digestive system: Secondary | ICD-10-CM | POA: Diagnosis not present

## 2020-06-13 ENCOUNTER — Other Ambulatory Visit: Payer: Self-pay

## 2020-06-13 ENCOUNTER — Ambulatory Visit: Payer: Medicare HMO | Admitting: Internal Medicine

## 2020-06-13 ENCOUNTER — Encounter (HOSPITAL_COMMUNITY): Payer: Self-pay | Admitting: Pulmonary Disease

## 2020-06-13 LAB — CYTOLOGY - NON PAP

## 2020-06-13 NOTE — Telephone Encounter (Signed)
I spoke to dtr.  She asked that MRI be switched to [facility] due to she works there and she has a family member that can take her mom to appt tomorrow.  I canceled MRI at [facility] & called [facility] & got auth changed to [facility].  Nothing further needed.

## 2020-06-13 NOTE — Telephone Encounter (Signed)
I just saw this message and I have already scheduled pt's MRI at [facility] for 2/1.  I called dtr and left her vm to let her know I just saw this msg and MRI has already been scheduled and I have given appt info to pt.  I asked her to call me back to let me know if she wants me to change appt.  GI is usually booked further out than hospital.

## 2020-06-14 ENCOUNTER — Ambulatory Visit
Admission: RE | Admit: 2020-06-14 | Discharge: 2020-06-14 | Disposition: A | Discharge status: Home / Self Care | Payer: Medicare HMO | Source: Ambulatory Visit | Attending: Pulmonary Disease | Admitting: Pulmonary Disease

## 2020-06-14 ENCOUNTER — Encounter: Payer: Self-pay | Admitting: Internal Medicine

## 2020-06-14 ENCOUNTER — Other Ambulatory Visit: Payer: Self-pay

## 2020-06-14 ENCOUNTER — Ambulatory Visit (INDEPENDENT_AMBULATORY_CARE_PROVIDER_SITE_OTHER): Payer: Medicare HMO | Admitting: Internal Medicine

## 2020-06-14 VITALS — BP 140/82 | HR 80 | Temp 98.2°F | Ht 65.0 in | Wt 175.0 lb

## 2020-06-14 DIAGNOSIS — C3490 Malignant neoplasm of unspecified part of unspecified bronchus or lung: Secondary | ICD-10-CM | POA: Diagnosis not present

## 2020-06-14 DIAGNOSIS — J329 Chronic sinusitis, unspecified: Secondary | ICD-10-CM | POA: Diagnosis not present

## 2020-06-14 DIAGNOSIS — E7849 Other hyperlipidemia: Secondary | ICD-10-CM | POA: Diagnosis not present

## 2020-06-14 DIAGNOSIS — U071 COVID-19: Secondary | ICD-10-CM | POA: Diagnosis not present

## 2020-06-14 DIAGNOSIS — J3489 Other specified disorders of nose and nasal sinuses: Secondary | ICD-10-CM | POA: Diagnosis not present

## 2020-06-14 DIAGNOSIS — M170 Bilateral primary osteoarthritis of knee: Secondary | ICD-10-CM | POA: Diagnosis not present

## 2020-06-14 DIAGNOSIS — J4550 Severe persistent asthma, uncomplicated: Secondary | ICD-10-CM | POA: Diagnosis not present

## 2020-06-14 DIAGNOSIS — R739 Hyperglycemia, unspecified: Secondary | ICD-10-CM

## 2020-06-14 DIAGNOSIS — I10 Essential (primary) hypertension: Secondary | ICD-10-CM

## 2020-06-14 DIAGNOSIS — R918 Other nonspecific abnormal finding of lung field: Secondary | ICD-10-CM | POA: Diagnosis not present

## 2020-06-14 DIAGNOSIS — Z48815 Encounter for surgical aftercare following surgery on the digestive system: Secondary | ICD-10-CM | POA: Diagnosis not present

## 2020-06-14 DIAGNOSIS — K565 Intestinal adhesions [bands], unspecified as to partial versus complete obstruction: Secondary | ICD-10-CM | POA: Diagnosis not present

## 2020-06-14 DIAGNOSIS — R911 Solitary pulmonary nodule: Secondary | ICD-10-CM

## 2020-06-14 DIAGNOSIS — R599 Enlarged lymph nodes, unspecified: Secondary | ICD-10-CM

## 2020-06-14 DIAGNOSIS — Z9889 Other specified postprocedural states: Secondary | ICD-10-CM

## 2020-06-14 DIAGNOSIS — M19041 Primary osteoarthritis, right hand: Secondary | ICD-10-CM | POA: Diagnosis not present

## 2020-06-14 DIAGNOSIS — R9082 White matter disease, unspecified: Secondary | ICD-10-CM | POA: Diagnosis not present

## 2020-06-14 DIAGNOSIS — E049 Nontoxic goiter, unspecified: Secondary | ICD-10-CM | POA: Diagnosis not present

## 2020-06-14 LAB — CYTOLOGY - NON PAP

## 2020-06-14 IMAGING — MR MR HEAD WO/W CM
13 series · 48 of 48 positions shown · IV contrast (15 ml multihance)
Comparison: Head CTs [DATE] and earlier.

CLINICAL DATA: 79-year-old female with non-small cell lung cancer
diagnosed in the past two months. Staging.

EXAM:
MRI HEAD WITHOUT AND WITH CONTRAST
TECHNIQUE: Multiplanar, multiecho pulse sequences of the brain and surrounding
structures were obtained without and with intravenous contrast.
CONTRAST:  14mL MULTIHANCE GADOBENATE DIMEGLUMINE 529 MG/ML IV SOLN

[Series 3: T1 · sagittal · 5.0mm · 0.45mm/px · 1 of 21 slices shown]
[im 1/21]
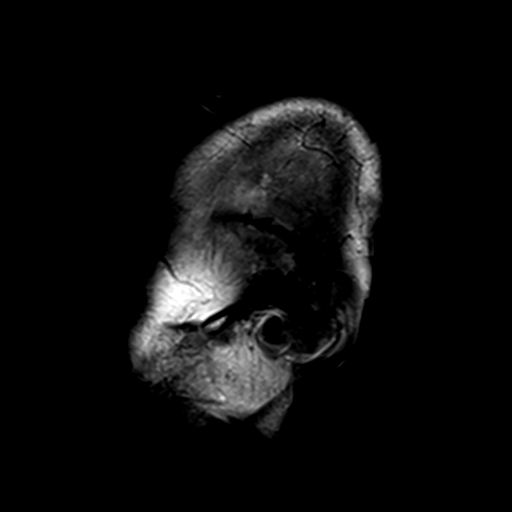

[Series 4: DWI · axial · 3.0mm · 1.80mm/px · z∈[-4,+142]mm · 7 of 99 slices shown (1 of 4)]
[im 1/99]
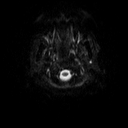
[im 17/99]
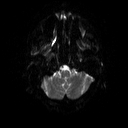
[im 33/99]
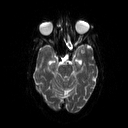
[im 50/99]
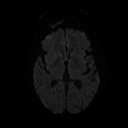
[im 66/99]
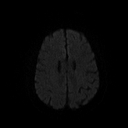
[im 82/99]
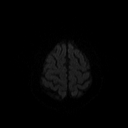
[im 99/99]
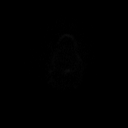

[Series 5: DWI · axial · 3.0mm · 1.80mm/px · z∈[-4,+142]mm · 3 of 46 slices shown (2 of 4)]
[im 1/46]
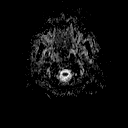
[im 23/46]
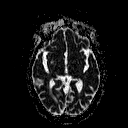
[im 46/46]
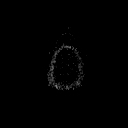

[Series 6: DWI · coronal · 5.0mm · 1.80mm/px · 5 of 68 slices shown (3 of 4)]
[im 1/68]
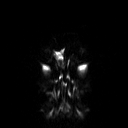
[im 17/68]
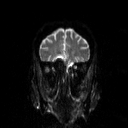
[im 34/68]
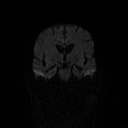
[im 51/68]
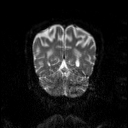
[im 68/68]
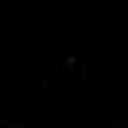

[Series 7: DWI · coronal · 5.0mm · 1.80mm/px · 2 of 34 slices shown (4 of 4)]
[im 1/34]
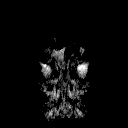
[im 34/34]
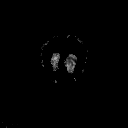

[Series 8: T2 · axial · 5.0mm · 0.72mm/px · 1 of 22 slices shown (1 of 2)]
[im 1/22]
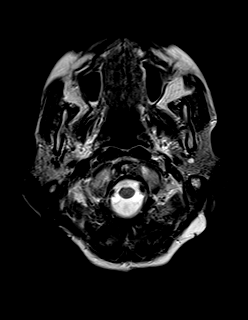

[Series 9: FLAIR · axial · 3.0mm · 0.45mm/px · z∈[+2,+136]mm · 2 of 30 slices shown]
[im 1/30]
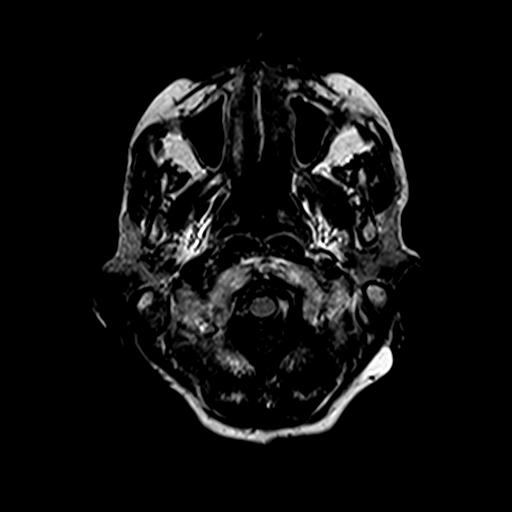
[im 30/30]
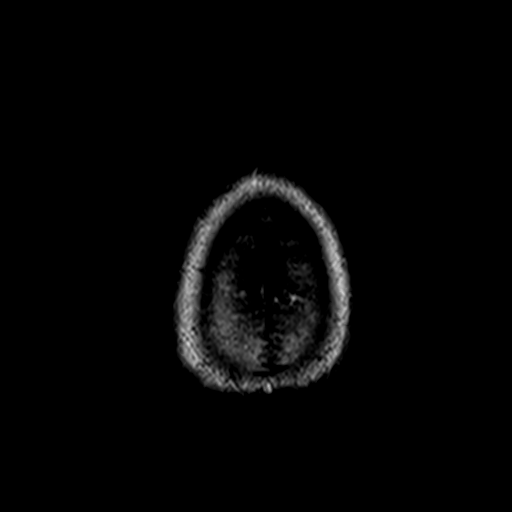

[Series 11: swi_images · axial · 4.0mm · 0.90mm/px · z∈[-0,+139]mm · 2 of 36 slices shown]
[im 1/36]
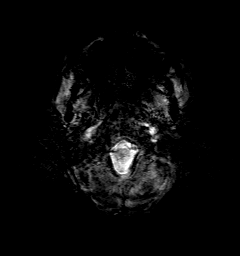
[im 36/36]
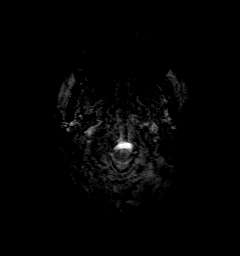

[Series 12: t1_mpr_tra · axial · 1.0mm · 0.75mm/px · z∈[-3,+138]mm · 10 of 144 slices shown]
[im 1/144]
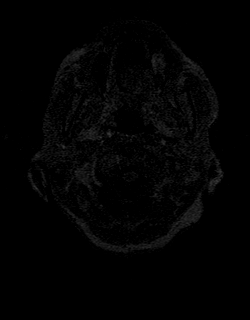
[im 16/144]
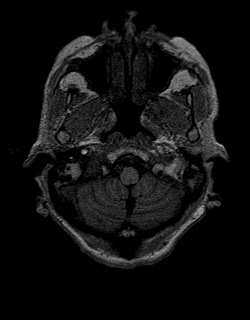
[im 32/144]
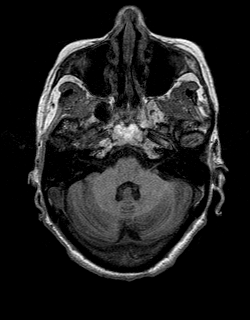
[im 48/144]
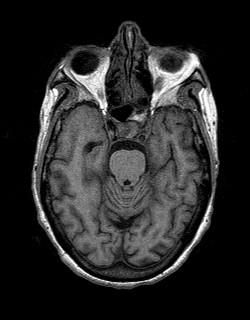
[im 64/144]
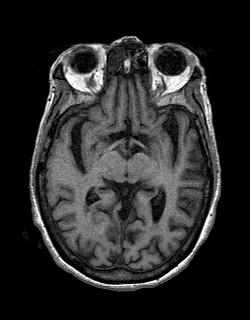
[im 80/144]
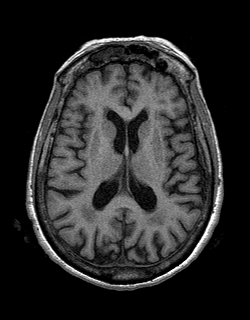
[im 96/144]
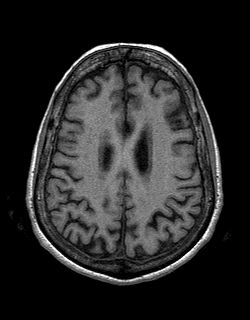
[im 112/144]
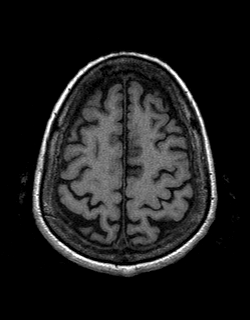
[im 128/144]
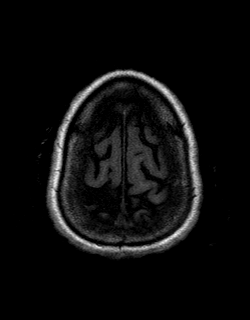
[im 144/144]
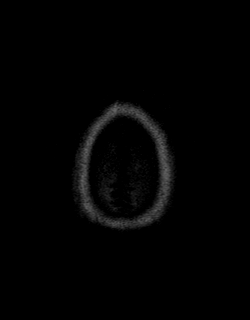

[Series 13: T2 · coronal · 5.0mm · 0.45mm/px · 2 of 25 slices shown (2 of 2)]
[im 1/25]
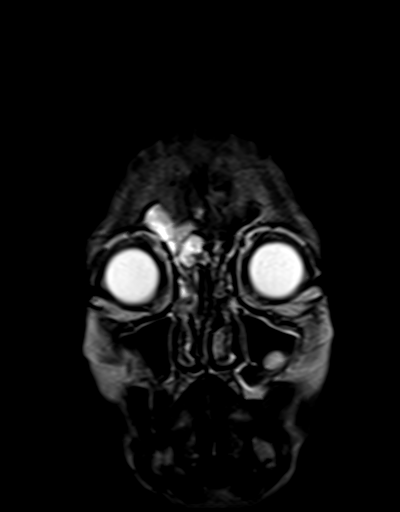
[im 25/25]
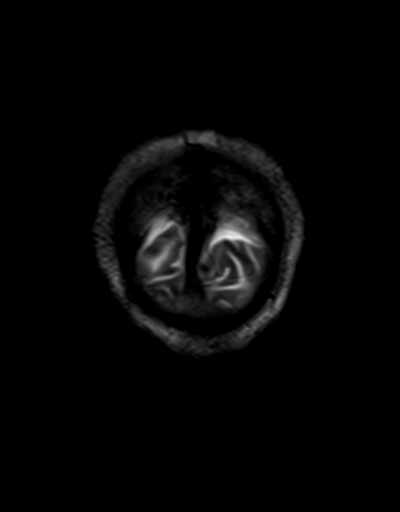

[Series 14: t1_mpr_tra post · axial · 1.0mm · 0.75mm/px · z∈[-3,+138]mm · 10 of 144 slices shown]
[im 1/144]
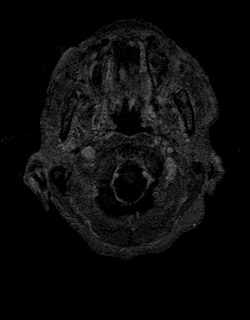
[im 16/144]
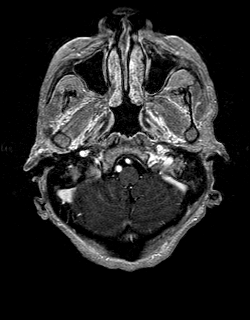
[im 32/144]
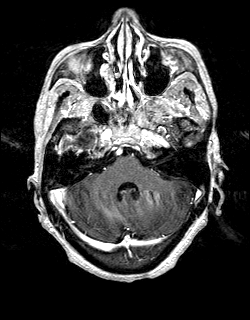
[im 48/144]
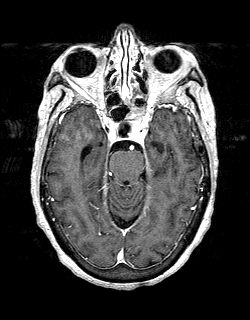
[im 64/144]
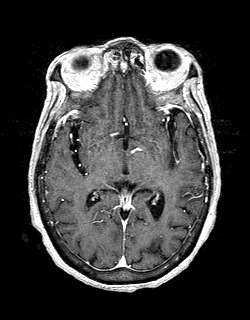
[im 80/144]
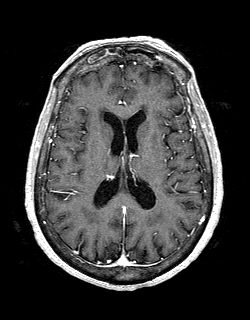
[im 96/144]
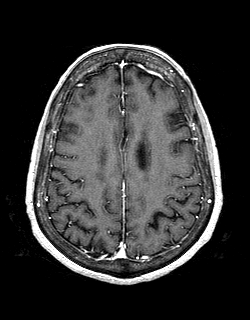
[im 112/144]
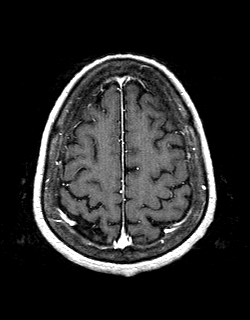
[im 128/144]
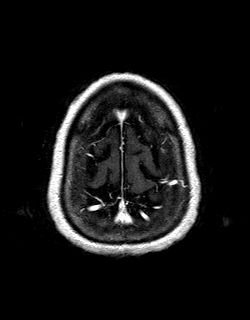
[im 144/144]
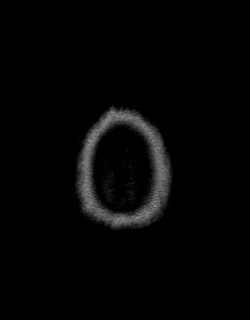

[Series 15: post cor · coronal · 5.0mm · 0.45mm/px · 2 of 25 slices shown]
[im 1/25]
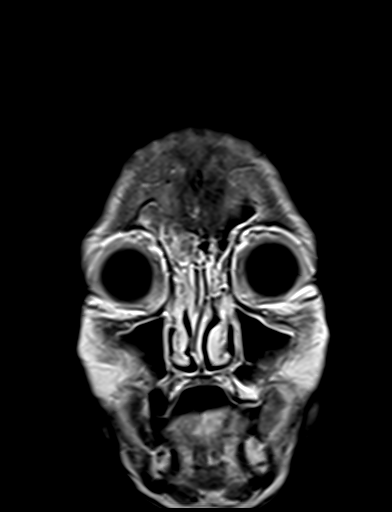
[im 25/25]
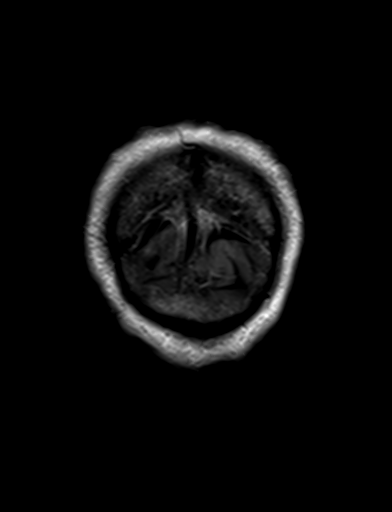

[Series 16: T1 post-contrast · sagittal · 5.0mm · 0.45mm/px · 1 of 21 slices shown]
[im 1/21]
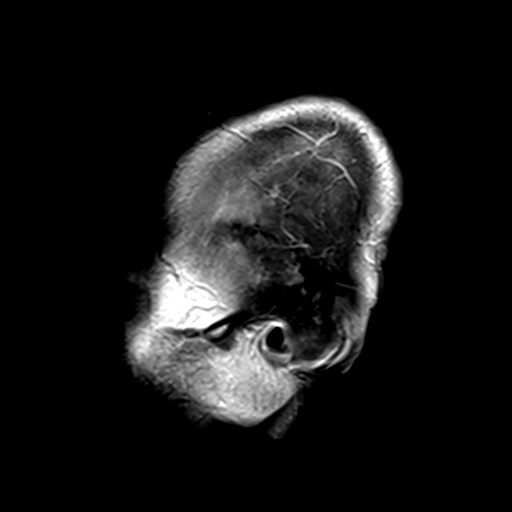

[48 of 48 positions shown; findings below may reference images not displayed]

FINDINGS: Brain: No abnormal enhancement identified. No dural thickening
identified.

No restricted diffusion to suggest acute infarction. No midline
shift, mass effect, evidence of mass lesion, ventriculomegaly,
extra-axial collection or acute intracranial hemorrhage.
Cervicomedullary junction and pituitary are within normal limits.

Mild for age mostly periventricular white matter T2 and FLAIR
hyperintensity in a nonspecific configuration. No cortical
encephalomalacia or chronic cerebral blood products identified. Deep
gray matter nuclei, brainstem and cerebellum appear negative.

Vascular: Major intracranial vascular flow voids are preserved. The
major dural venous sinuses are enhancing and appear to be patent.

Skull and upper cervical spine: Visible cervical spine is normal for
age. Visualized bone marrow signal is within normal limits.

Sinuses/Orbits: Postoperative changes to both globes. Widespread
paranasal sinus mucosal thickening and opacification. Similar sinus
disease on prior head CTs. No complicating features.

Other: Mastoid air cells are well aerated, trace fluid on the right.
Grossly normal visible internal auditory structures. Negative
visible nasopharynx. Visible scalp and face soft tissues appear
negative.
IMPRESSION: 1. No metastatic disease or acute intracranial abnormality.
2. Mild for age cerebral white matter signal changes most commonly
due to chronic small vessel disease.
3. Chronic paranasal sinus disease.

## 2020-06-14 MED ORDER — LINACLOTIDE 72 MCG PO CAPS: ORAL_CAPSULE | ORAL | 0 refills | Status: DC

## 2020-06-14 MED ORDER — AMLODIPINE BESYLATE 10 MG PO TABS: 10.0000 mg | ORAL_TABLET | Freq: Every day | ORAL | 0 refills | Status: DC

## 2020-06-14 MED ORDER — GADOBENATE DIMEGLUMINE 529 MG/ML IV SOLN
14.0000 mL | Freq: Once | INTRAVENOUS | Status: AC | PRN
  Administered 2020-06-14: 14 mL via INTRAVENOUS

## 2020-06-14 MED ORDER — PRAVASTATIN SODIUM 80 MG PO TABS: 80.0000 mg | ORAL_TABLET | Freq: Every day | ORAL | 3 refills | Status: DC

## 2020-06-14 MED ORDER — LOSARTAN POTASSIUM 100 MG PO TABS: 100.0000 mg | ORAL_TABLET | Freq: Every day | ORAL | 3 refills | Status: DC

## 2020-06-14 NOTE — Progress Notes (Signed)
Established Patient Office Visit  Subjective:  Patient ID: Angelica [name], female    DOB: Nov 25, 1940  Age: 80 y.o. MRN: [id number]      Chief Complaint: follow up HTN, HLD and hyperglycemia        HPI:  Angelica [name] is a 80 y.o. female here with no specific complaints.  Pt denies chest pain, increased sob or doe, wheezing, orthopnea, PND, increased LE swelling, palpitations, dizziness or syncope.  Pt denies new neurological symptoms such as new headache, or facial or extremity weakness or numbness   Pt denies polydipsia, polyuria,  .        Wt Readings from Last 3 Encounters:  06/14/20 175 lb (79.4 kg)  06/10/20 171 lb 4.8 oz (77.7 kg)  06/09/20 171 lb 4 oz (77.7 kg)   BP Readings from Last 3 Encounters:  06/14/20 140/82  06/10/20 113/71  06/09/20 118/70         Past Medical History:  Diagnosis Date  . Arthritis   . Asthma   . Bowel obstruction ([place])   . Environmental allergies   . GERD (gastroesophageal reflux disease)   . Glaucoma 05/09/2017  . H. pylori infection   . HLD (hyperlipidemia) 01/16/2019  . Hypertension   . Iron deficiency anemia   . Osteopenia 05/26/2017  . Thyroid disease   . Urticaria 07/25/2018   Past Surgical History:  Procedure Laterality Date  . ABDOMINAL HYSTERECTOMY    . BOWEL RESECTION N/A 05/23/2020   Procedure: SMALL BOWEL REPAIR;  Surgeon: Kinsinger, Arta Bruce, MD;  Location: [facility];  Service: General;  Laterality: N/A;  . BREAST BIOPSY    . BRONCHIAL BIOPSY  06/10/2020   Procedure: BRONCHIAL BIOPSIES;  Surgeon: Garner Nash, DO;  Location: [facility];  Service: Pulmonary;;  . BRONCHIAL BRUSHINGS  06/10/2020   Procedure: BRONCHIAL BRUSHINGS;  Surgeon: Garner Nash, DO;  Location: [facility];  Service: Pulmonary;;  . BRONCHIAL NEEDLE ASPIRATION BIOPSY  06/10/2020   Procedure: BRONCHIAL NEEDLE ASPIRATION BIOPSIES;  Surgeon: Garner Nash, DO;  Location: [facility];  Service: Pulmonary;;  . BRONCHIAL WASHINGS  06/10/2020    Procedure: BRONCHIAL WASHINGS;  Surgeon: Garner Nash, DO;  Location: [facility];  Service: Pulmonary;;  . COLON SURGERY    . DIAGNOSTIC LARYNGOSCOPY N/A 05/23/2020   Procedure: ATTEMPTED DIAGNOSTIC LAPAROSCOPY WITH ADHESIONS;  Surgeon: Kieth Brightly, Arta Bruce, MD;  Location: [facility];  Service: General;  Laterality: N/A;  . KNEE SURGERY    . LAPAROTOMY N/A 04/18/2015   Procedure: Exploratory laparotomy with lysis of adhesions, possible bowel resection;  Surgeon: Ralene Ok, MD;  Location: [facility];  Service: General;  Laterality: N/A;  . LAPAROTOMY N/A 05/23/2020   Procedure: EXPLORATORY LAPAROTOMY;  Surgeon: Kieth Brightly Arta Bruce, MD;  Location: [facility];  Service: General;  Laterality: N/A;  . LYSIS OF ADHESION N/A 05/23/2020   Procedure: LYSIS OF ADHESION;  Surgeon: Kieth Brightly Arta Bruce, MD;  Location: [facility];  Service: General;  Laterality: N/A;  . NASAL SINUS SURGERY    . THYROID SURGERY    . VIDEO BRONCHOSCOPY WITH ENDOBRONCHIAL NAVIGATION Bilateral 06/10/2020   Procedure: VIDEO BRONCHOSCOPY WITH ENDOBRONCHIAL NAVIGATION;  Surgeon: Garner Nash, DO;  Location: [facility];  Service: Pulmonary;  Laterality: Bilateral;  . VIDEO BRONCHOSCOPY WITH ENDOBRONCHIAL ULTRASOUND  06/10/2020   Procedure: VIDEO BRONCHOSCOPY WITH ENDOBRONCHIAL ULTRASOUND;  Surgeon: Garner Nash, DO;  Location: [facility];  Service: Pulmonary;;    reports that she has quit smoking. Her smoking  use included cigarettes. She has never used smokeless tobacco. She reports that she does not drink alcohol and does not use drugs. family history includes Diabetes in her mother; Glaucoma in her brother and sister; Hypertension in her father and mother. Allergies  Allergen Reactions  . Asa Buff (Mag [Buffered Aspirin] Other (See Comments)    Excessive sweating  . Fish Allergy Other (See Comments)    Unknown reaction, but allergic  . Milk-Related Compounds   . Other Other (See Comments)    Gelcaps; gel-containing  capsules; extended-release medication  . Peanut-Containing Drug Products Other (See Comments)    From allergy test  . Penicillins Itching    Has patient had a PCN reaction causing immediate rash, facial/tongue/throat swelling, SOB or lightheadedness with hypotension: No Has patient had a PCN reaction causing severe rash involving mucus membranes or skin necrosis: No Has patient had a PCN reaction that required hospitalization No Has patient had a PCN reaction occurring within the last 10 years: No If all of the above answers are "NO", then may proceed with Cephalosporin use.  Marland Kitchen Shellfish-Derived Products Other (See Comments)    From allergy test  . Strawberry Extract Other (See Comments)    Worsened allergy symptoms   Current Outpatient Medications on File Prior to Visit  Medication Sig Dispense Refill  . acetaminophen (TYLENOL) 325 MG tablet Take 2 tablets (650 mg total) by mouth every 6 (six) hours as needed for headache.    . Benralizumab (FASENRA PEN) 30 MG/ML SOAJ Inject into the skin every 8 (eight) weeks.    . budesonide-formoterol (SYMBICORT) 80-4.5 MCG/ACT inhaler Inhale 2 puffs into the lungs 2 (two) times daily. Use for asthma flares 2 puffs twice a day for 2 weeks or until cough and wheeze free (Patient taking differently: Inhale 2 puffs into the lungs 2 (two) times daily as needed (for asthma flares, for 2 weeks, or until cough and wheeze-free).) 10.2 g 5  . Cholecalciferol (VITAMIN D3) 50 MCG (2000 UT) TABS Take 2,000 Units by mouth daily.    Marland Kitchen EPINEPHrine (EPIPEN 2-PAK) 0.3 mg/0.3 mL IJ SOAJ injection Use as directed for severe allergic reaction (Patient taking differently: Inject 0.3 mg into the muscle as needed for anaphylaxis.) 2 each 1  . ipratropium (ATROVENT) 0.06 % nasal spray Place 2 sprays into both nostrils 3 (three) times daily. (Patient taking differently: Place 1 spray into both nostrils 3 (three) times daily as needed (allergies.).) 15 mL 3  . loratadine (CLARITIN)  10 MG tablet Take 10 mg by mouth daily.    . Magnesium Hydroxide (PHILLIPS MILK OF MAGNESIA PO) Take 15-30 mLs by mouth daily as needed (for constipation).    . polyethylene glycol powder (GLYCOLAX/MIRALAX) powder Take 17 g by mouth daily as needed for mild constipation (MIX AND DRINK).    Marland Kitchen traMADol (ULTRAM) 50 MG tablet Take 1 tablet (50 mg total) by mouth every 6 (six) hours as needed for moderate pain. 15 tablet 0  . vitamin B-12 (CYANOCOBALAMIN) 1000 MCG tablet Take 1,000 mcg by mouth daily.     Current Facility-Administered Medications on File Prior to Visit  Medication Dose Route Frequency Provider Last Rate Last Admin  . Benralizumab SOSY 30 mg  30 mg Subcutaneous Q8 Weeks Kennith Gain, MD   30 mg at 05/03/20 0947        ROS:  All others reviewed and negative.  Objective        PE:  BP 140/82   Pulse 80  Temp 98.2 F (36.8 C) (Temporal)   Ht 5\' 5"  (1.651 m)   Wt 175 lb (79.4 kg)   SpO2 99%   BMI 29.12 kg/m                 Constitutional: Pt appears in NAD               HENT: Head: NCAT.                Right Ear: External ear normal.                 Left Ear: External ear normal.                Eyes: . Pupils are equal, round, and reactive to light. Conjunctivae and EOM are normal               Nose: without d/c or deformity               Neck: Neck supple. Gross normal ROM               Cardiovascular: Normal rate and regular rhythm.                 Pulmonary/Chest: Effort normal and breath sounds without rales or wheezing.                Abd:  Soft, NT, ND, + BS, no organomegaly               Neurological: Pt is alert. At baseline orientation, motor grossly intact               Skin: Skin is warm. No rashes, no other new lesions, LE edema - none               Psychiatric: Pt behavior is normal without agitation   Assessment/Plan:  KARESHA TRZCINSKI is a 80 y.o. Black or African American [2] female with  has a past medical history of Arthritis, Asthma,  Bowel obstruction ([place]), Environmental allergies, GERD (gastroesophageal reflux disease), Glaucoma (05/09/2017), H. pylori infection, HLD (hyperlipidemia) (01/16/2019), Hypertension, Iron deficiency anemia, Osteopenia (05/26/2017), Thyroid disease, and Urticaria (07/25/2018).   Micro: none  Cardiac tracings I have personally interpreted today:  none  Pertinent Radiological findings (summarize): none   Lab Results  Component Value Date   WBC 12.8 (H) 05/30/2020   HGB 11.3 (L) 05/30/2020   HCT 35.0 (L) 05/30/2020   PLT 253 05/30/2020   GLUCOSE 98 06/01/2020   CHOL 208 (H) 05/03/2020   TRIG 99 05/30/2020   HDL 69.90 05/03/2020   LDLCALC 120 (H) 05/03/2020   ALT 245 (H) 05/30/2020   AST 140 (H) 05/30/2020   NA 134 (L) 06/01/2020   K 4.6 06/01/2020   CL 102 06/01/2020   CREATININE 0.94 06/01/2020   BUN 27 (H) 06/01/2020   CO2 23 06/01/2020   TSH 3.12 05/03/2020   HGBA1C 5.8 05/03/2020     Assessment & Plan:   Problem List Items Addressed This Visit      Medium   Hyperglycemia    Lab Results  Component Value Date   HGBA1C 5.8 05/03/2020   Stable, pt to continue current medical treatment  - diet        HTN (hypertension) - Primary    BP Readings from Last 3 Encounters:  06/14/20 140/82  06/10/20 113/71  06/09/20 118/70   Stable, pt to continue medical treatment norvasc, amlodpine  Current Outpatient Medications (Cardiovascular):  Marland Kitchen  EPINEPHrine (EPIPEN 2-PAK) 0.3 mg/0.3 mL IJ SOAJ injection, Use as directed for severe allergic reaction (Patient taking differently: Inject 0.3 mg into the muscle as needed for anaphylaxis.) .  amLODipine (NORVASC) 10 MG tablet, Take 1 tablet (10 mg total) by mouth daily. Marland Kitchen  losartan (COZAAR) 100 MG tablet, Take 1 tablet (100 mg total) by mouth daily. .  pravastatin (PRAVACHOL) 80 MG tablet, Take 1 tablet (80 mg total) by mouth daily.   Current Outpatient Medications (Respiratory):  .  Benralizumab (FASENRA PEN) 30 MG/ML SOAJ,  Inject into the skin every 8 (eight) weeks. .  budesonide-formoterol (SYMBICORT) 80-4.5 MCG/ACT inhaler, Inhale 2 puffs into the lungs 2 (two) times daily. Use for asthma flares 2 puffs twice a day for 2 weeks or until cough and wheeze free (Patient taking differently: Inhale 2 puffs into the lungs 2 (two) times daily as needed (for asthma flares, for 2 weeks, or until cough and wheeze-free).) .  ipratropium (ATROVENT) 0.06 % nasal spray, Place 2 sprays into both nostrils 3 (three) times daily. (Patient taking differently: Place 1 spray into both nostrils 3 (three) times daily as needed (allergies.).) .  loratadine (CLARITIN) 10 MG tablet, Take 10 mg by mouth daily.  Current Facility-Administered Medications (Respiratory):  .  Benralizumab SOSY 30 mg  Current Outpatient Medications (Analgesics):  .  acetaminophen (TYLENOL) 325 MG tablet, Take 2 tablets (650 mg total) by mouth every 6 (six) hours as needed for headache. .  traMADol (ULTRAM) 50 MG tablet, Take 1 tablet (50 mg total) by mouth every 6 (six) hours as needed for moderate pain.   Current Outpatient Medications (Hematological):  .  vitamin B-12 (CYANOCOBALAMIN) 1000 MCG tablet, Take 1,000 mcg by mouth daily.   Current Outpatient Medications (Other):  Marland Kitchen  Cholecalciferol (VITAMIN D3) 50 MCG (2000 UT) TABS, Take 2,000 Units by mouth daily. .  Magnesium Hydroxide (PHILLIPS MILK OF MAGNESIA PO), Take 15-30 mLs by mouth daily as needed (for constipation). .  polyethylene glycol powder (GLYCOLAX/MIRALAX) powder, Take 17 g by mouth daily as needed for mild constipation (MIX AND DRINK). Marland Kitchen  linaclotide (LINZESS) 72 MCG capsule, TAKE 1 CAPSULE BY MOUTH ONCE DAILY AS NEEDED        HLD (hyperlipidemia)    Lab Results  Component Value Date   LDLCALC 120 (H) 05/03/2020   Stable, pt to continue current statin  - declines change at this time pravachol 80, to work on low chol diet    Current Outpatient Medications (Cardiovascular):  Marland Kitchen   EPINEPHrine (EPIPEN 2-PAK) 0.3 mg/0.3 mL IJ SOAJ injection, Use as directed for severe allergic reaction (Patient taking differently: Inject 0.3 mg into the muscle as needed for anaphylaxis.) .  amLODipine (NORVASC) 10 MG tablet, Take 1 tablet (10 mg total) by mouth daily. Marland Kitchen  losartan (COZAAR) 100 MG tablet, Take 1 tablet (100 mg total) by mouth daily. .  pravastatin (PRAVACHOL) 80 MG tablet, Take 1 tablet (80 mg total) by mouth daily.   Current Outpatient Medications (Respiratory):  .  Benralizumab (FASENRA PEN) 30 MG/ML SOAJ, Inject into the skin every 8 (eight) weeks. .  budesonide-formoterol (SYMBICORT) 80-4.5 MCG/ACT inhaler, Inhale 2 puffs into the lungs 2 (two) times daily. Use for asthma flares 2 puffs twice a day for 2 weeks or until cough and wheeze free (Patient taking differently: Inhale 2 puffs into the lungs 2 (two) times daily as needed (for asthma flares, for 2 weeks, or until cough and wheeze-free).) .  ipratropium (ATROVENT) 0.06 % nasal spray, Place 2 sprays into both nostrils 3 (three) times daily. (Patient taking differently: Place 1 spray into both nostrils 3 (three) times daily as needed (allergies.).) .  loratadine (CLARITIN) 10 MG tablet, Take 10 mg by mouth daily.  Current Facility-Administered Medications (Respiratory):  .  Benralizumab SOSY 30 mg  Current Outpatient Medications (Analgesics):  .  acetaminophen (TYLENOL) 325 MG tablet, Take 2 tablets (650 mg total) by mouth every 6 (six) hours as needed for headache. .  traMADol (ULTRAM) 50 MG tablet, Take 1 tablet (50 mg total) by mouth every 6 (six) hours as needed for moderate pain.   Current Outpatient Medications (Hematological):  .  vitamin B-12 (CYANOCOBALAMIN) 1000 MCG tablet, Take 1,000 mcg by mouth daily.   Current Outpatient Medications (Other):  Marland Kitchen  Cholecalciferol (VITAMIN D3) 50 MCG (2000 UT) TABS, Take 2,000 Units by mouth daily. .  Magnesium Hydroxide (PHILLIPS MILK OF MAGNESIA PO), Take 15-30 mLs by  mouth daily as needed (for constipation). .  polyethylene glycol powder (GLYCOLAX/MIRALAX) powder, Take 17 g by mouth daily as needed for mild constipation (MIX AND DRINK). Marland Kitchen  linaclotide (LINZESS) 72 MCG capsule, TAKE 1 CAPSULE BY MOUTH ONCE DAILY AS NEEDED           No orders of the defined types were placed in this encounter.   Follow-up: Return in about 6 months (around 12/12/2020).    Cathlean Cower, MD 06/19/2020 1:20 PM [facility]

## 2020-06-14 NOTE — Patient Instructions (Signed)
Please continue all other medications as before, and refills have been done if requested.  Please have the pharmacy call with any other refills you may need.  Please continue your efforts at being more active, low cholesterol diet, and weight control.  Please keep your appointments with your specialists as you may have planned  Please make an Appointment to return in 6 months, or sooner if needed 

## 2020-06-15 ENCOUNTER — Telehealth: Payer: Self-pay | Admitting: Pulmonary Disease

## 2020-06-15 ENCOUNTER — Other Ambulatory Visit: Payer: Self-pay

## 2020-06-15 ENCOUNTER — Ambulatory Visit (HOSPITAL_COMMUNITY)
Admission: RE | Admit: 2020-06-15 | Discharge: 2020-06-15 | Disposition: A | Discharge status: Home / Self Care | Payer: Medicare HMO | Source: Ambulatory Visit | Attending: Pulmonary Disease | Admitting: Pulmonary Disease

## 2020-06-15 DIAGNOSIS — M170 Bilateral primary osteoarthritis of knee: Secondary | ICD-10-CM

## 2020-06-15 DIAGNOSIS — C3432 Malignant neoplasm of lower lobe, left bronchus or lung: Secondary | ICD-10-CM | POA: Diagnosis not present

## 2020-06-15 DIAGNOSIS — I10 Essential (primary) hypertension: Secondary | ICD-10-CM | POA: Diagnosis not present

## 2020-06-15 DIAGNOSIS — Z79899 Other long term (current) drug therapy: Secondary | ICD-10-CM

## 2020-06-15 DIAGNOSIS — U071 COVID-19: Secondary | ICD-10-CM | POA: Diagnosis not present

## 2020-06-15 DIAGNOSIS — Z79891 Long term (current) use of opiate analgesic: Secondary | ICD-10-CM

## 2020-06-15 DIAGNOSIS — D509 Iron deficiency anemia, unspecified: Secondary | ICD-10-CM

## 2020-06-15 DIAGNOSIS — M19042 Primary osteoarthritis, left hand: Secondary | ICD-10-CM

## 2020-06-15 DIAGNOSIS — E049 Nontoxic goiter, unspecified: Secondary | ICD-10-CM

## 2020-06-15 DIAGNOSIS — H409 Unspecified glaucoma: Secondary | ICD-10-CM

## 2020-06-15 DIAGNOSIS — K219 Gastro-esophageal reflux disease without esophagitis: Secondary | ICD-10-CM

## 2020-06-15 DIAGNOSIS — K565 Intestinal adhesions [bands], unspecified as to partial versus complete obstruction: Secondary | ICD-10-CM | POA: Diagnosis not present

## 2020-06-15 DIAGNOSIS — R599 Enlarged lymph nodes, unspecified: Secondary | ICD-10-CM

## 2020-06-15 DIAGNOSIS — Z9181 History of falling: Secondary | ICD-10-CM

## 2020-06-15 DIAGNOSIS — J4550 Severe persistent asthma, uncomplicated: Secondary | ICD-10-CM

## 2020-06-15 DIAGNOSIS — Z48815 Encounter for surgical aftercare following surgery on the digestive system: Secondary | ICD-10-CM | POA: Diagnosis not present

## 2020-06-15 DIAGNOSIS — M19041 Primary osteoarthritis, right hand: Secondary | ICD-10-CM

## 2020-06-15 DIAGNOSIS — M8580 Other specified disorders of bone density and structure, unspecified site: Secondary | ICD-10-CM

## 2020-06-15 DIAGNOSIS — R918 Other nonspecific abnormal finding of lung field: Secondary | ICD-10-CM | POA: Insufficient documentation

## 2020-06-15 DIAGNOSIS — C3490 Malignant neoplasm of unspecified part of unspecified bronchus or lung: Secondary | ICD-10-CM | POA: Diagnosis not present

## 2020-06-15 LAB — GLUCOSE, CAPILLARY: Glucose-Capillary: 91 mg/dL (ref 70–99)

## 2020-06-15 IMAGING — PT NM PET TUM IMG INITIAL (PI) SKULL BASE T - THIGH
1 of 7 series · 1 of 25 positions shown · non-contrast
Comparison: CT on [DATE]

CLINICAL DATA: Initial treatment strategy for non-small cell lung
carcinoma.

EXAM:
NUCLEAR MEDICINE PET SKULL BASE TO THIGH
TECHNIQUE: 8.6 mCi F-18 FDG was injected intravenously. Full-ring PET imaging
was performed from the skull base to thigh after the radiotracer. CT
data was obtained and used for attenuation correction and anatomic
localization.
Fasting blood glucose: 91 mg/dl

[Series 3: pet sk_thigh ac · axial · 5.0mm · 4.07mm/px · 1 of 225 slices shown]
[im 113/225]
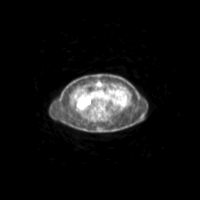

[1 of 25 positions shown; findings below may reference images not displayed]

FINDINGS: Mediastinal blood-pool activity (background): SUV max =

Liver activity (reference): SUV max = N/A

NECK: A 6 mm right low jugular/level 4 lymph node is seen on image
38/4, which is hypermetabolic with SUV max of 4.9. a 5 mm left level
1B submandibular lymph node is seen which also shows hypermetabolic
activity, with SUV max of 3.4.

Diffusely enlarged thyroid gland is seen with multiple bilateral
hypermetabolic nodules.

Incidental CT findings:  None.

CHEST: Hypermetabolic mass is seen in the medial left lower lobe
which measures 3.6 x 2.4 cm, and has SUV max of 13.7. A small
hypermetabolic left hilar lymph node is seen with SUV max of 4.6.
Hypermetabolic mediastinal lymph nodes are seen in the right
paratracheal and subcarinal regions, with index lymph node measuring
1.7 cm on image 66/4 with SUV max of 11.2.

Incidental CT findings:  None.

ABDOMEN/PELVIS: No abnormal hypermetabolic activity within the
liver, pancreas, adrenal glands, or spleen. No hypermetabolic lymph
nodes in the abdomen or pelvis.

Incidental CT findings: Prior hysterectomy noted. Aortic
atherosclerotic calcification noted.

SKELETON: No focal hypermetabolic bone lesions to suggest skeletal
metastasis.

Incidental CT findings:  None.
IMPRESSION: Hypermetabolic mass in the medial left lower lobe, consistent with
primary bronchogenic carcinoma.

Mild hypermetabolic left hilar and mediastinal lymphadenopathy,
consistent with metastatic disease.

Sub-cm hypermetabolic right level 4 and left level 1B cervical lymph
nodes, suspicious for metastatic disease.

No evidence of metastatic disease within the abdomen or pelvis.

Enlarged heterogeneous thyroid gland with bilateral heterogeneous
hypermetabolic activity. Recommend thyroid ultrasound. (ref: [HOSPITAL]. [DATE]): 143-50).

## 2020-06-15 MED ORDER — FLUDEOXYGLUCOSE F - 18 (FDG) INJECTION
8.6000 | Freq: Once | INTRAVENOUS | Status: AC | PRN
  Administered 2020-06-15: 8.6 via INTRAVENOUS

## 2020-06-15 NOTE — Telephone Encounter (Signed)
Called and spoke with pt's daughter [name] letting her know the info stated by Dr. Valeta Harms in regards to the bronch biopsy results on pt. Angelica [name] verbalized understanding. She also stated that pt has been scheduled with Oncology next week.  Angelica [name] wanted to know if pt needed to schedule a f/u with either BI or APP or if pt should just f/u with oncology for now.  Dr. Valeta Harms, please advise.

## 2020-06-15 NOTE — Telephone Encounter (Signed)
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  I called and left a VM on daughters phone to give [place] a call back.   PET has also been completed and she has appt with oncology next week  [facility] 06/15/2020 1:44 PM

## 2020-06-15 NOTE — Telephone Encounter (Signed)
Let scheduled follow up with me in 2-3 months  [facility] 06/15/2020 6:17 PM

## 2020-06-16 DIAGNOSIS — M170 Bilateral primary osteoarthritis of knee: Secondary | ICD-10-CM | POA: Diagnosis not present

## 2020-06-16 DIAGNOSIS — Z48815 Encounter for surgical aftercare following surgery on the digestive system: Secondary | ICD-10-CM | POA: Diagnosis not present

## 2020-06-16 DIAGNOSIS — I10 Essential (primary) hypertension: Secondary | ICD-10-CM | POA: Diagnosis not present

## 2020-06-16 DIAGNOSIS — J4550 Severe persistent asthma, uncomplicated: Secondary | ICD-10-CM | POA: Diagnosis not present

## 2020-06-16 DIAGNOSIS — M19041 Primary osteoarthritis, right hand: Secondary | ICD-10-CM | POA: Diagnosis not present

## 2020-06-16 DIAGNOSIS — U071 COVID-19: Secondary | ICD-10-CM | POA: Diagnosis not present

## 2020-06-16 DIAGNOSIS — R918 Other nonspecific abnormal finding of lung field: Secondary | ICD-10-CM | POA: Diagnosis not present

## 2020-06-16 DIAGNOSIS — E049 Nontoxic goiter, unspecified: Secondary | ICD-10-CM | POA: Diagnosis not present

## 2020-06-16 DIAGNOSIS — K565 Intestinal adhesions [bands], unspecified as to partial versus complete obstruction: Secondary | ICD-10-CM | POA: Diagnosis not present

## 2020-06-16 NOTE — Telephone Encounter (Signed)
lmtcb for pt.   Schedule 2-3 month ROV with Dr. Valeta Harms.

## 2020-06-17 ENCOUNTER — Telehealth: Payer: Self-pay | Admitting: *Deleted

## 2020-06-17 NOTE — Telephone Encounter (Signed)
Received call from pt's daughter, Angelica [name] requesting that her brother be able to attend this patient's initial visit with Dr. Lorenso Courier. They are quite anxious about this patient's new lung mass.  Advised that this one time it will be ok to have both daughter and son come with pt. Appointment note added.

## 2020-06-19 ENCOUNTER — Encounter: Payer: Self-pay | Admitting: Internal Medicine

## 2020-06-19 NOTE — Assessment & Plan Note (Signed)
Lab Results  Component Value Date   LDLCALC 120 (H) 05/03/2020   Stable, pt to continue current statin  - declines change at this time pravachol 80, to work on low chol diet    Current Outpatient Medications (Cardiovascular):  Marland Kitchen  EPINEPHrine (EPIPEN 2-PAK) 0.3 mg/0.3 mL IJ SOAJ injection, Use as directed for severe allergic reaction (Patient taking differently: Inject 0.3 mg into the muscle as needed for anaphylaxis.) .  amLODipine (NORVASC) 10 MG tablet, Take 1 tablet (10 mg total) by mouth daily. Marland Kitchen  losartan (COZAAR) 100 MG tablet, Take 1 tablet (100 mg total) by mouth daily. .  pravastatin (PRAVACHOL) 80 MG tablet, Take 1 tablet (80 mg total) by mouth daily.   Current Outpatient Medications (Respiratory):  .  Benralizumab (FASENRA PEN) 30 MG/ML SOAJ, Inject into the skin every 8 (eight) weeks. .  budesonide-formoterol (SYMBICORT) 80-4.5 MCG/ACT inhaler, Inhale 2 puffs into the lungs 2 (two) times daily. Use for asthma flares 2 puffs twice a day for 2 weeks or until cough and wheeze free (Patient taking differently: Inhale 2 puffs into the lungs 2 (two) times daily as needed (for asthma flares, for 2 weeks, or until cough and wheeze-free).) .  ipratropium (ATROVENT) 0.06 % nasal spray, Place 2 sprays into both nostrils 3 (three) times daily. (Patient taking differently: Place 1 spray into both nostrils 3 (three) times daily as needed (allergies.).) .  loratadine (CLARITIN) 10 MG tablet, Take 10 mg by mouth daily.  Current Facility-Administered Medications (Respiratory):  .  Benralizumab SOSY 30 mg  Current Outpatient Medications (Analgesics):  .  acetaminophen (TYLENOL) 325 MG tablet, Take 2 tablets (650 mg total) by mouth every 6 (six) hours as needed for headache. .  traMADol (ULTRAM) 50 MG tablet, Take 1 tablet (50 mg total) by mouth every 6 (six) hours as needed for moderate pain.   Current Outpatient Medications (Hematological):  .  vitamin B-12 (CYANOCOBALAMIN) 1000 MCG tablet,  Take 1,000 mcg by mouth daily.   Current Outpatient Medications (Other):  Marland Kitchen  Cholecalciferol (VITAMIN D3) 50 MCG (2000 UT) TABS, Take 2,000 Units by mouth daily. .  Magnesium Hydroxide (PHILLIPS MILK OF MAGNESIA PO), Take 15-30 mLs by mouth daily as needed (for constipation). .  polyethylene glycol powder (GLYCOLAX/MIRALAX) powder, Take 17 g by mouth daily as needed for mild constipation (MIX AND DRINK). Marland Kitchen  linaclotide (LINZESS) 72 MCG capsule, TAKE 1 CAPSULE BY MOUTH ONCE DAILY AS NEEDED

## 2020-06-19 NOTE — Assessment & Plan Note (Signed)
BP Readings from Last 3 Encounters:  06/14/20 140/82  06/10/20 113/71  06/09/20 118/70   Stable, pt to continue medical treatment norvasc, amlodpine    Current Outpatient Medications (Cardiovascular):  Marland Kitchen  EPINEPHrine (EPIPEN 2-PAK) 0.3 mg/0.3 mL IJ SOAJ injection, Use as directed for severe allergic reaction (Patient taking differently: Inject 0.3 mg into the muscle as needed for anaphylaxis.) .  amLODipine (NORVASC) 10 MG tablet, Take 1 tablet (10 mg total) by mouth daily. Marland Kitchen  losartan (COZAAR) 100 MG tablet, Take 1 tablet (100 mg total) by mouth daily. .  pravastatin (PRAVACHOL) 80 MG tablet, Take 1 tablet (80 mg total) by mouth daily.   Current Outpatient Medications (Respiratory):  .  Benralizumab (FASENRA PEN) 30 MG/ML SOAJ, Inject into the skin every 8 (eight) weeks. .  budesonide-formoterol (SYMBICORT) 80-4.5 MCG/ACT inhaler, Inhale 2 puffs into the lungs 2 (two) times daily. Use for asthma flares 2 puffs twice a day for 2 weeks or until cough and wheeze free (Patient taking differently: Inhale 2 puffs into the lungs 2 (two) times daily as needed (for asthma flares, for 2 weeks, or until cough and wheeze-free).) .  ipratropium (ATROVENT) 0.06 % nasal spray, Place 2 sprays into both nostrils 3 (three) times daily. (Patient taking differently: Place 1 spray into both nostrils 3 (three) times daily as needed (allergies.).) .  loratadine (CLARITIN) 10 MG tablet, Take 10 mg by mouth daily.  Current Facility-Administered Medications (Respiratory):  .  Benralizumab SOSY 30 mg  Current Outpatient Medications (Analgesics):  .  acetaminophen (TYLENOL) 325 MG tablet, Take 2 tablets (650 mg total) by mouth every 6 (six) hours as needed for headache. .  traMADol (ULTRAM) 50 MG tablet, Take 1 tablet (50 mg total) by mouth every 6 (six) hours as needed for moderate pain.   Current Outpatient Medications (Hematological):  .  vitamin B-12 (CYANOCOBALAMIN) 1000 MCG tablet, Take 1,000 mcg by mouth  daily.   Current Outpatient Medications (Other):  Marland Kitchen  Cholecalciferol (VITAMIN D3) 50 MCG (2000 UT) TABS, Take 2,000 Units by mouth daily. .  Magnesium Hydroxide (PHILLIPS MILK OF MAGNESIA PO), Take 15-30 mLs by mouth daily as needed (for constipation). .  polyethylene glycol powder (GLYCOLAX/MIRALAX) powder, Take 17 g by mouth daily as needed for mild constipation (MIX AND DRINK). Marland Kitchen  linaclotide (LINZESS) 72 MCG capsule, TAKE 1 CAPSULE BY MOUTH ONCE DAILY AS NEEDED

## 2020-06-19 NOTE — Assessment & Plan Note (Signed)
Lab Results  Component Value Date   HGBA1C 5.8 05/03/2020   Stable, pt to continue current medical treatment  - diet

## 2020-06-20 DIAGNOSIS — R918 Other nonspecific abnormal finding of lung field: Secondary | ICD-10-CM | POA: Diagnosis not present

## 2020-06-20 DIAGNOSIS — M170 Bilateral primary osteoarthritis of knee: Secondary | ICD-10-CM | POA: Diagnosis not present

## 2020-06-20 DIAGNOSIS — K565 Intestinal adhesions [bands], unspecified as to partial versus complete obstruction: Secondary | ICD-10-CM | POA: Diagnosis not present

## 2020-06-20 DIAGNOSIS — Z48815 Encounter for surgical aftercare following surgery on the digestive system: Secondary | ICD-10-CM | POA: Diagnosis not present

## 2020-06-20 DIAGNOSIS — M19041 Primary osteoarthritis, right hand: Secondary | ICD-10-CM | POA: Diagnosis not present

## 2020-06-20 DIAGNOSIS — J4550 Severe persistent asthma, uncomplicated: Secondary | ICD-10-CM | POA: Diagnosis not present

## 2020-06-20 DIAGNOSIS — E049 Nontoxic goiter, unspecified: Secondary | ICD-10-CM | POA: Diagnosis not present

## 2020-06-20 DIAGNOSIS — U071 COVID-19: Secondary | ICD-10-CM | POA: Diagnosis not present

## 2020-06-20 DIAGNOSIS — I10 Essential (primary) hypertension: Secondary | ICD-10-CM | POA: Diagnosis not present

## 2020-06-21 ENCOUNTER — Ambulatory Visit (HOSPITAL_COMMUNITY): Payer: Medicare HMO

## 2020-06-21 DIAGNOSIS — R918 Other nonspecific abnormal finding of lung field: Secondary | ICD-10-CM | POA: Diagnosis not present

## 2020-06-21 DIAGNOSIS — J4550 Severe persistent asthma, uncomplicated: Secondary | ICD-10-CM | POA: Diagnosis not present

## 2020-06-21 DIAGNOSIS — M19041 Primary osteoarthritis, right hand: Secondary | ICD-10-CM | POA: Diagnosis not present

## 2020-06-21 DIAGNOSIS — Z48815 Encounter for surgical aftercare following surgery on the digestive system: Secondary | ICD-10-CM | POA: Diagnosis not present

## 2020-06-21 DIAGNOSIS — U071 COVID-19: Secondary | ICD-10-CM | POA: Diagnosis not present

## 2020-06-21 DIAGNOSIS — E049 Nontoxic goiter, unspecified: Secondary | ICD-10-CM | POA: Diagnosis not present

## 2020-06-21 DIAGNOSIS — K565 Intestinal adhesions [bands], unspecified as to partial versus complete obstruction: Secondary | ICD-10-CM | POA: Diagnosis not present

## 2020-06-21 DIAGNOSIS — M170 Bilateral primary osteoarthritis of knee: Secondary | ICD-10-CM | POA: Diagnosis not present

## 2020-06-21 DIAGNOSIS — I10 Essential (primary) hypertension: Secondary | ICD-10-CM | POA: Diagnosis not present

## 2020-06-22 ENCOUNTER — Inpatient Hospital Stay: Payer: Medicare HMO

## 2020-06-22 ENCOUNTER — Telehealth: Payer: Self-pay | Admitting: *Deleted

## 2020-06-22 ENCOUNTER — Telehealth: Payer: Self-pay | Admitting: Internal Medicine

## 2020-06-22 ENCOUNTER — Encounter: Payer: Self-pay | Admitting: Internal Medicine

## 2020-06-22 ENCOUNTER — Encounter: Payer: Self-pay | Admitting: *Deleted

## 2020-06-22 ENCOUNTER — Inpatient Hospital Stay: Payer: Medicare HMO | Attending: Internal Medicine | Admitting: Internal Medicine

## 2020-06-22 ENCOUNTER — Other Ambulatory Visit: Payer: Self-pay

## 2020-06-22 ENCOUNTER — Encounter: Payer: Self-pay | Admitting: Emergency Medicine

## 2020-06-22 DIAGNOSIS — Z79899 Other long term (current) drug therapy: Secondary | ICD-10-CM | POA: Insufficient documentation

## 2020-06-22 DIAGNOSIS — Z87891 Personal history of nicotine dependence: Secondary | ICD-10-CM | POA: Diagnosis not present

## 2020-06-22 DIAGNOSIS — R5383 Other fatigue: Secondary | ICD-10-CM | POA: Diagnosis not present

## 2020-06-22 DIAGNOSIS — C3492 Malignant neoplasm of unspecified part of left bronchus or lung: Secondary | ICD-10-CM

## 2020-06-22 DIAGNOSIS — D509 Iron deficiency anemia, unspecified: Secondary | ICD-10-CM | POA: Insufficient documentation

## 2020-06-22 DIAGNOSIS — C3432 Malignant neoplasm of lower lobe, left bronchus or lung: Secondary | ICD-10-CM | POA: Diagnosis not present

## 2020-06-22 DIAGNOSIS — R918 Other nonspecific abnormal finding of lung field: Secondary | ICD-10-CM

## 2020-06-22 DIAGNOSIS — M1990 Unspecified osteoarthritis, unspecified site: Secondary | ICD-10-CM | POA: Diagnosis not present

## 2020-06-22 DIAGNOSIS — K219 Gastro-esophageal reflux disease without esophagitis: Secondary | ICD-10-CM | POA: Diagnosis not present

## 2020-06-22 DIAGNOSIS — Z5111 Encounter for antineoplastic chemotherapy: Secondary | ICD-10-CM | POA: Diagnosis not present

## 2020-06-22 DIAGNOSIS — R634 Abnormal weight loss: Secondary | ICD-10-CM | POA: Insufficient documentation

## 2020-06-22 LAB — CBC WITH DIFFERENTIAL (CANCER CENTER ONLY)
Abs Immature Granulocytes: 0.02 10*3/uL (ref 0.00–0.07)
Basophils Absolute: 0 10*3/uL (ref 0.0–0.1)
Basophils Relative: 0 %
Eosinophils Absolute: 0 10*3/uL (ref 0.0–0.5)
Eosinophils Relative: 0 %
HCT: 38.1 % (ref 36.0–46.0)
Hemoglobin: 12.3 g/dL (ref 12.0–15.0)
Immature Granulocytes: 0 %
Lymphocytes Relative: 32 %
Lymphs Abs: 2.9 10*3/uL (ref 0.7–4.0)
MCH: 29.7 pg (ref 26.0–34.0)
MCHC: 32.3 g/dL (ref 30.0–36.0)
MCV: 92 fL (ref 80.0–100.0)
Monocytes Absolute: 0.8 10*3/uL (ref 0.1–1.0)
Monocytes Relative: 9 %
Neutro Abs: 5.5 10*3/uL (ref 1.7–7.7)
Neutrophils Relative %: 59 %
Platelet Count: 271 10*3/uL (ref 150–400)
RBC: 4.14 MIL/uL (ref 3.87–5.11)
RDW: 13.8 % (ref 11.5–15.5)
WBC Count: 9.2 10*3/uL (ref 4.0–10.5)
nRBC: 0 % (ref 0.0–0.2)

## 2020-06-22 LAB — CMP (CANCER CENTER ONLY)
ALT: 18 U/L (ref 0–44)
AST: 21 U/L (ref 15–41)
Albumin: 3.8 g/dL (ref 3.5–5.0)
Alkaline Phosphatase: 90 U/L (ref 38–126)
Anion gap: 11 (ref 5–15)
BUN: 8 mg/dL (ref 8–23)
CO2: 26 mmol/L (ref 22–32)
Calcium: 9.4 mg/dL (ref 8.9–10.3)
Chloride: 104 mmol/L (ref 98–111)
Creatinine: 0.87 mg/dL (ref 0.44–1.00)
GFR, Estimated: >60 mL/min (ref >60)
Glucose, Bld: 121 mg/dL — ABNORMAL HIGH (ref 70–99)
Potassium: 3.3 mmol/L — ABNORMAL LOW (ref 3.5–5.1)
Sodium: 141 mmol/L (ref 135–145)
Total Bilirubin: 0.4 mg/dL (ref 0.3–1.2)
Total Protein: 7.6 g/dL (ref 6.5–8.1)

## 2020-06-22 MED ORDER — PROCHLORPERAZINE MALEATE 10 MG PO TABS: 10.0000 mg | ORAL_TABLET | Freq: Four times a day (QID) | ORAL | 0 refills | Status: DC | PRN

## 2020-06-22 NOTE — Progress Notes (Signed)
  Oncology Nurse Navigator Documentation  Oncology Nurse Navigator Flowsheets 06/22/2020  Abnormal Finding Date 05/17/2020  Confirmed Diagnosis Date 06/10/2020  Diagnosis Status Pending Molecular Studies  Planned Course of Treatment Chemo/Radiation Concurrent  Phase of Treatment Radiation  Navigator Follow Up Date: 06/24/2020  Navigator Follow Up Reason: Appointment Review  Navigator Location [facility]  Navigator Encounter Type Clinic/MDC  Patient Visit Type Initial  Treatment Phase Pre-Tx/Tx Discussion  Barriers/Navigation Needs Coordination of Care;Education  Education Newly Diagnosed Cancer Education;Other  Interventions Education;Coordination of Care;Psycho-Social Support/Angelica [name] is a new patient of Dr. Worthy Flank.  She was seen in clinic today for her initial visit.  Plan of care is concurrent chemo rad. I gave and explained treatment plans and next steps.  I will follow up with rad onc to update on referral.  Very nice meeting Angelica [name] and her family today.    Acuity Level 2-Minimal Needs (1-2 Barriers Identified)  Coordination of Care Other  Education Method Verbal;Written  Time Spent with Patient 30

## 2020-06-22 NOTE — Telephone Encounter (Signed)
Scheduled appointments per 2/2 los. Spoke to patient who is aware of appointments dates and times. Gave patient calendar print out.

## 2020-06-22 NOTE — Progress Notes (Signed)
START ON PATHWAY REGIMEN - Non-Small Cell Lung     Administer weekly:     Paclitaxel      Carboplatin   **Always confirm dose/schedule in your pharmacy ordering system**  Patient Characteristics: Preoperative or Nonsurgical Candidate (Clinical Staging), Stage III - Nonsurgical Candidate (Nonsquamous and Squamous), PS = 0, 1 Therapeutic Status: Preoperative or Nonsurgical Candidate (Clinical Staging) AJCC T Category: cT2a AJCC N Category: cN3 AJCC M Category: cM0 AJCC 8 Stage Grouping: IIIB ECOG Performance Status: 1 Intent of Therapy: Curative Intent, Discussed with Patient

## 2020-06-22 NOTE — Progress Notes (Signed)
[place] [phone]   Fax:([phone]  CONSULT NOTE  REFERRING PHYSICIAN: Dr. Leory Plowman Icard  REASON FOR CONSULTATION:  80 years old African-American female recently diagnosed with lung cancer.  HPI Angelica [name] is a 80 y.o. female with past medical history significant for osteoarthritis, asthma, small bowel obstruction, GERD, hypertension, H. pylori, iron deficiency anemia as well as osteoporosis and remote history of short.  Of smoking.  The patient presented to the hospital complaining of abdominal pain.  She had CT scan of the abdomen pelvis to rule out small bowel obstruction.  The scan was performed on May 17, 2020 and it showed small bowel obstruction with transition point in the mid central abdomen likely due to adhesions.  Incidentally the scan showed lobulated 2.5 cm left lower lobe pulmonary nodule with irregular margins increased in size from prior exam and suspicious for primary bronchogenic malignancy.  This was followed by CT scan of the chest on the same day and it showed pulmonary mass measuring 3.5 x 2.8 cm with spiculated margins contacting the visceral pleura in the left lower lobe situated in the medial aspect of the left lower lobe.  There was also left hilar and mediastinal adenopathy suggestive of bronchogenic carcinoma.  There was a tiny 2-3 mm nodule in the right chest.  The patient had Covid infection at the time of her diagnosis and she was on quarantine for 7 days before she had surgical intervention for the small bowel obstruction.  She was then referred to Dr. Valeta Harms and on June 11, 2019 she underwent video bronchoscopy with electromagnetic navigation procedure with endobronchial ultrasound.  The final pathology ([id number]) showed malignant cells consistent with non-small cell carcinoma. TTF-1 and Napsin-A are strong and diffusely positive. P40 is negative. CK5/6 demonstrates patchy nonspecific staining. 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 The patient had MRI of the brain on June 16, 2019 and it showed no evidence of metastatic disease to the brain.  She also has a PET scan performed on the same day and it showed hypermetabolic mass in the medial left lower lobe consistent with the primary bronchogenic carcinoma.  There was mild hypermetabolic left hilar and mediastinal lymphadenopathy consistent with metastatic disease and subcentimeter hypermetabolic right level 4 and left level 1B cervical lymph node suspicious for metastatic disease but no evidence of metastatic disease within the abdomen or pelvis. Dr. Valeta Harms kindly referred the patient to me today for evaluation and recommendation regarding treatment of her condition. When seen today the patient is feeling fine with no concerning complaints she denied having any current chest pain, shortness of breath, cough or hemoptysis.  She has no nausea, vomiting, diarrhea or constipation.  She lost around 10 pounds in the last few months.  She has no headache or visual changes.  Family history significant for mother with diabetes mellitus and hypertension, father had hypertension and sister had leukemia. The patient is a widow and has 2 sons and 2 daughters.  She was accompanied today by her daughter Angelica [name] and son Angelica [name].  The patient used to work at [employer].  She has a remote history of short periid of smoking and quit long time ago.  She has no history of alcohol or drug abuse.  HPI  Past Medical History:  Diagnosis Date  . Arthritis   . Asthma   . Bowel obstruction ([place])   . Environmental allergies   . GERD (gastroesophageal reflux disease)   . Glaucoma 05/09/2017  .  H. pylori infection   . HLD (hyperlipidemia) 01/16/2019  . Hypertension   . Iron deficiency anemia   . Osteopenia 05/26/2017  . Thyroid disease   . Urticaria 07/25/2018    Past Surgical History:  Procedure Laterality Date  . ABDOMINAL HYSTERECTOMY    .  BOWEL RESECTION N/A 05/23/2020   Procedure: SMALL BOWEL REPAIR;  Surgeon: Kinsinger, Arta Bruce, MD;  Location: [place];  Service: General;  Laterality: N/A;  . BREAST BIOPSY    . BRONCHIAL BIOPSY  06/10/2020   Procedure: BRONCHIAL BIOPSIES;  Surgeon: Garner Nash, DO;  Location: [place] [facility];  Service: Pulmonary;;  . BRONCHIAL BRUSHINGS  06/10/2020   Procedure: BRONCHIAL BRUSHINGS;  Surgeon: Garner Nash, DO;  Location: [place] [facility];  Service: Pulmonary;;  . BRONCHIAL NEEDLE ASPIRATION BIOPSY  06/10/2020   Procedure: BRONCHIAL NEEDLE ASPIRATION BIOPSIES;  Surgeon: Garner Nash, DO;  Location: [place] [facility];  Service: Pulmonary;;  . BRONCHIAL WASHINGS  06/10/2020   Procedure: BRONCHIAL WASHINGS;  Surgeon: Garner Nash, DO;  Location: [place] [facility];  Service: Pulmonary;;  . COLON SURGERY    . DIAGNOSTIC LARYNGOSCOPY N/A 05/23/2020   Procedure: ATTEMPTED DIAGNOSTIC LAPAROSCOPY WITH ADHESIONS;  Surgeon: Kieth Brightly, Arta Bruce, MD;  Location: [place];  Service: General;  Laterality: N/A;  . KNEE SURGERY    . LAPAROTOMY N/A 04/18/2015   Procedure: Exploratory laparotomy with lysis of adhesions, possible bowel resection;  Surgeon: Ralene Ok, MD;  Location: [place];  Service: General;  Laterality: N/A;  . LAPAROTOMY N/A 05/23/2020   Procedure: EXPLORATORY LAPAROTOMY;  Surgeon: Kieth Brightly Arta Bruce, MD;  Location: [place];  Service: General;  Laterality: N/A;  . LYSIS OF ADHESION N/A 05/23/2020   Procedure: LYSIS OF ADHESION;  Surgeon: Kieth Brightly Arta Bruce, MD;  Location: [place];  Service: General;  Laterality: N/A;  . NASAL SINUS SURGERY    . THYROID SURGERY    . VIDEO BRONCHOSCOPY WITH ENDOBRONCHIAL NAVIGATION Bilateral 06/10/2020   Procedure: VIDEO BRONCHOSCOPY WITH ENDOBRONCHIAL NAVIGATION;  Surgeon: Garner Nash, DO;  Location: [place];  Service: Pulmonary;  Laterality: Bilateral;  . VIDEO BRONCHOSCOPY WITH ENDOBRONCHIAL ULTRASOUND  06/10/2020   Procedure: VIDEO BRONCHOSCOPY WITH  ENDOBRONCHIAL ULTRASOUND;  Surgeon: Garner Nash, DO;  Location: [facility];  Service: Pulmonary;;    Family History  Problem Relation Age of Onset  . Diabetes Mother   . Hypertension Mother   . Hypertension Father   . Glaucoma Sister   . Glaucoma Brother   . Allergic rhinitis Neg Hx   . Angioedema Neg Hx   . Asthma Neg Hx   . Eczema Neg Hx   . Immunodeficiency Neg Hx   . Urticaria Neg Hx     Social History Social History   Tobacco Use  . Smoking status: Former Smoker    Types: Cigarettes  . Smokeless tobacco: Never Used  Vaping Use  . Vaping Use: Never used  Substance Use Topics  . Alcohol use: No  . Drug use: No    Allergies  Allergen Reactions  . Asa Buff (Mag [Buffered Aspirin] Other (See Comments)    Excessive sweating  . Fish Allergy Other (See Comments)    Unknown reaction, but allergic  . Milk-Related Compounds   . Other Other (See Comments)    Gelcaps; gel-containing capsules; extended-release medication  . Peanut-Containing Drug Products Other (See Comments)    From allergy test  . Penicillins Itching    Has patient had a PCN reaction causing immediate rash, facial/tongue/throat swelling,  SOB or lightheadedness with hypotension: No Has patient had a PCN reaction causing severe rash involving mucus membranes or skin necrosis: No Has patient had a PCN reaction that required hospitalization No Has patient had a PCN reaction occurring within the last 10 years: No If all of the above answers are "NO", then may proceed with Cephalosporin use.  . Shellfish-Derived Products Other (See Comments)    From allergy test  . Strawberry Extract Other (See Comments)    Worsened allergy symptoms    Current Outpatient Medications  Medication Sig Dispense Refill  . acetaminophen (TYLENOL) 325 MG tablet Take 2 tablets (650 mg total) by mouth every 6 (six) hours as needed for headache.    Marland Kitchen amLODipine (NORVASC) 10 MG tablet Take 1 tablet (10 mg total) by mouth  daily. 90 tablet 0  . Benralizumab (FASENRA PEN) 30 MG/ML SOAJ Inject into the skin every 8 (eight) weeks.    . budesonide-formoterol (SYMBICORT) 80-4.5 MCG/ACT inhaler Inhale 2 puffs into the lungs 2 (two) times daily. Use for asthma flares 2 puffs twice a day for 2 weeks or until cough and wheeze free (Patient taking differently: Inhale 2 puffs into the lungs 2 (two) times daily as needed (for asthma flares, for 2 weeks, or until cough and wheeze-free).) 10.2 g 5  . Cholecalciferol (VITAMIN D3) 50 MCG (2000 UT) TABS Take 2,000 Units by mouth daily.    Marland Kitchen EPINEPHrine (EPIPEN 2-PAK) 0.3 mg/0.3 mL IJ SOAJ injection Use as directed for severe allergic reaction (Patient taking differently: Inject 0.3 mg into the muscle as needed for anaphylaxis.) 2 each 1  . ipratropium (ATROVENT) 0.06 % nasal spray Place 2 sprays into both nostrils 3 (three) times daily. (Patient taking differently: Place 1 spray into both nostrils 3 (three) times daily as needed (allergies.).) 15 mL 3  . linaclotide (LINZESS) 72 MCG capsule TAKE 1 CAPSULE BY MOUTH ONCE DAILY AS NEEDED 30 capsule 0  . loratadine (CLARITIN) 10 MG tablet Take 10 mg by mouth daily.    Marland Kitchen losartan (COZAAR) 100 MG tablet Take 1 tablet (100 mg total) by mouth daily. 90 tablet 3  . Magnesium Hydroxide (PHILLIPS MILK OF MAGNESIA PO) Take 15-30 mLs by mouth daily as needed (for constipation).    . polyethylene glycol powder (GLYCOLAX/MIRALAX) powder Take 17 g by mouth daily as needed for mild constipation (MIX AND DRINK).    . pravastatin (PRAVACHOL) 80 MG tablet Take 1 tablet (80 mg total) by mouth daily. 90 tablet 3  . traMADol (ULTRAM) 50 MG tablet Take 1 tablet (50 mg total) by mouth every 6 (six) hours as needed for moderate pain. 15 tablet 0  . vitamin B-12 (CYANOCOBALAMIN) 1000 MCG tablet Take 1,000 mcg by mouth daily.     Current Facility-Administered Medications  Medication Dose Route Frequency Provider Last Rate Last Admin  . Benralizumab SOSY 30 mg   30 mg Subcutaneous Q8 Weeks Kennith Gain, MD   30 mg at 05/03/20 9371    Review of Systems  Constitutional: positive for fatigue and weight loss Eyes: negative Ears, nose, mouth, throat, and face: negative Respiratory: negative Cardiovascular: negative Gastrointestinal: negative Genitourinary:negative Integument/breast: negative Hematologic/lymphatic: negative Musculoskeletal:negative Neurological: negative Behavioral/Psych: negative Endocrine: negative Allergic/Immunologic: negative  Physical Exam  IRC:VELFY, healthy, no distress, well nourished and well developed SKIN: skin color, texture, turgor are normal, no rashes or significant lesions HEAD: Normocephalic, No masses, lesions, tenderness or abnormalities EYES: normal, PERRLA, Conjunctiva are pink and non-injected EARS: External ears normal, Canals clear  OROPHARYNX:no exudate, no erythema and lips, buccal mucosa, and tongue normal  NECK: supple, no adenopathy, no JVD LYMPH:  no palpable lymphadenopathy, no hepatosplenomegaly BREAST:not examined LUNGS: clear to auscultation , and palpation HEART: regular rate & rhythm, no murmurs and no gallops ABDOMEN:abdomen soft, non-tender, normal bowel sounds and no masses or organomegaly BACK: No CVA tenderness, Range of motion is normal EXTREMITIES:no joint deformities, effusion, or inflammation, no edema  NEURO: alert & oriented x 3 with fluent speech, no focal motor/sensory deficits  PERFORMANCE STATUS: ECOG 1  LABORATORY DATA: Lab Results  Component Value Date   WBC 9.2 06/22/2020   HGB 12.3 06/22/2020   HCT 38.1 06/22/2020   MCV 92.0 06/22/2020   PLT 271 06/22/2020      Chemistry      Component Value Date/Time   NA 141 06/22/2020 1340   K 3.3 (L) 06/22/2020 1340   CL 104 06/22/2020 1340   CO2 26 06/22/2020 1340   BUN 8 06/22/2020 1340   CREATININE 0.87 06/22/2020 1340      Component Value Date/Time   CALCIUM 9.4 06/22/2020 1340   ALKPHOS 90  06/22/2020 1340   AST 21 06/22/2020 1340   ALT 18 06/22/2020 1340   BILITOT 0.4 06/22/2020 1340       RADIOGRAPHIC STUDIES: MR BRAIN W WO CONTRAST  Result Date: 06/15/2020 CLINICAL DATA:  80 year old female with non-small cell lung cancer diagnosed in the past two months. Staging. EXAM: MRI HEAD WITHOUT AND WITH CONTRAST TECHNIQUE: Multiplanar, multiecho pulse sequences of the brain and surrounding structures were obtained without and with intravenous contrast. CONTRAST:  32mL MULTIHANCE GADOBENATE DIMEGLUMINE 529 MG/ML IV SOLN COMPARISON:  Head CTs 01/21/2020 and earlier. FINDINGS: Brain: No abnormal enhancement identified. No dural thickening identified. No restricted diffusion to suggest acute infarction. No midline shift, mass effect, evidence of mass lesion, ventriculomegaly, extra-axial collection or acute intracranial hemorrhage. Cervicomedullary junction and pituitary are within normal limits. Mild for age mostly periventricular white matter T2 and FLAIR hyperintensity in a nonspecific configuration. No cortical encephalomalacia or chronic cerebral blood products identified. Deep gray matter nuclei, brainstem and cerebellum appear negative. Vascular: Major intracranial vascular flow voids are preserved. The major dural venous sinuses are enhancing and appear to be patent. Skull and upper cervical spine: Visible cervical spine is normal for age. Visualized bone marrow signal is within normal limits. Sinuses/Orbits: Postoperative changes to both globes. Widespread paranasal sinus mucosal thickening and opacification. Similar sinus disease on prior head CTs. No complicating features. Other: Mastoid air cells are well aerated, trace fluid on the right. Grossly normal visible internal auditory structures. Negative visible nasopharynx. Visible scalp and face soft tissues appear negative. IMPRESSION: 1. No metastatic disease or acute intracranial abnormality. 2. Mild for age cerebral white matter signal  changes most commonly due to chronic small vessel disease. 3. Chronic paranasal sinus disease. Electronically Signed   By: Genevie Ann M.D.   On: 06/15/2020 05:58   NM PET Image Initial (PI) Skull Base To Thigh  Result Date: 06/15/2020 CLINICAL DATA:  Initial treatment strategy for non-small cell lung carcinoma. EXAM: NUCLEAR MEDICINE PET SKULL BASE TO THIGH TECHNIQUE: 8.6 mCi F-18 FDG was injected intravenously. Full-ring PET imaging was performed from the skull base to thigh after the radiotracer. CT data was obtained and used for attenuation correction and anatomic localization. Fasting blood glucose: 91 mg/dl COMPARISON:  CT on 05/17/2020 FINDINGS: Mediastinal blood-pool activity (background): SUV max = 2.8 Liver activity (reference): SUV max = N/A NECK: A 6  mm right low jugular/level 4 lymph node is seen on image 38/4, which is hypermetabolic with SUV max of 4.9. a 5 mm left level 1B submandibular lymph node is seen which also shows hypermetabolic activity, with SUV max of 3.4. Diffusely enlarged thyroid gland is seen with multiple bilateral hypermetabolic nodules. Incidental CT findings:  None. CHEST: Hypermetabolic mass is seen in the medial left lower lobe which measures 3.6 x 2.4 cm, and has SUV max of 13.7. A small hypermetabolic left hilar lymph node is seen with SUV max of 4.6. Hypermetabolic mediastinal lymph nodes are seen in the right paratracheal and subcarinal regions, with index lymph node measuring 1.7 cm on image 66/4 with SUV max of 11.2. Incidental CT findings:  None. ABDOMEN/PELVIS: No abnormal hypermetabolic activity within the liver, pancreas, adrenal glands, or spleen. No hypermetabolic lymph nodes in the abdomen or pelvis. Incidental CT findings: Prior hysterectomy noted. Aortic atherosclerotic calcification noted. SKELETON: No focal hypermetabolic bone lesions to suggest skeletal metastasis. Incidental CT findings:  None. IMPRESSION: Hypermetabolic mass in the medial left lower lobe,  consistent with primary bronchogenic carcinoma. Mild hypermetabolic left hilar and mediastinal lymphadenopathy, consistent with metastatic disease. Sub-cm hypermetabolic right level 4 and left level 1B cervical lymph nodes, suspicious for metastatic disease. No evidence of metastatic disease within the abdomen or pelvis. Enlarged heterogeneous thyroid gland with bilateral heterogeneous hypermetabolic activity. Recommend thyroid ultrasound. (ref: J Am Coll Radiol. 2015 Feb;12(2): 143-50). Electronically Signed   By: Marlaine Hind M.D.   On: 06/15/2020 10:50   DG CHEST PORT 1 VIEW  Result Date: 06/10/2020 CLINICAL DATA:  Status post bronchoscopy, history of left lower lobe mass lesion. EXAM: PORTABLE CHEST 1 VIEW COMPARISON:  05/17/2020 FINDINGS: Cardiac shadow is stable. Hypoinflation is seen. No pneumothorax is noted. The known left lower lobe mass lesion is not well appreciated due to elevation of the left hemidiaphragm. No bony abnormality is seen. IMPRESSION: No evidence of post bronchoscopy pneumothorax. Electronically Signed   By: Inez Catalina M.D.   On: 06/10/2020 15:23   DG Abd Portable 1V  Result Date: 05/31/2020 CLINICAL DATA:  Postop state. EXAM: PORTABLE ABDOMEN - 1 VIEW COMPARISON:  05/23/2020. FINDINGS: Surgical staples noted over the abdomen. Gastric distention. Distention of multiple loops of small bowel noted on today's exam. Single large loop of dilated small bowel in the right upper abdomen is less dilated on today's exam. Colon is nondistended. No free air identified. Degenerative change thoracolumbar spine. IMPRESSION: 1. Gastric distention. 2. Distention of multiple loops of small bowel noted on today's exam. Single large loop of dilated small bowel in the right upper abdomen is less dilated on today's exam. Colon is nondistended. No free air identified. Continued follow-up exam suggested to demonstrate resolution of gastric and small bowel distention. Electronically Signed   By: Marcello Moores   Register   On: 05/31/2020 14:57   DG C-ARM BRONCHOSCOPY  Result Date: 06/10/2020 C-ARM BRONCHOSCOPY: Fluoroscopy was utilized by the requesting physician.  No radiographic interpretation.    ASSESSMENT: This is a very pleasant 80 years old African-American female recently diagnosed with a stage IIIb (T2 a, N3, M0) non-small cell lung cancer, adenocarcinoma presented with left lower lobe lung mass in addition to left hilar and mediastinal as well as right and left cervical lymphadenopathy diagnosed in January 2022   PLAN: I had a lengthy discussion with the patient and her daughter and son today about her current disease stage, prognosis and treatment options. I personally and independently reviewed the PET scan  images and discussed the result and showed the images to the patient and her family today. I recommended for the patient a course of concurrent chemoradiation with weekly carboplatin for AUC of 2 and paclitaxel 45 mg/M2 for a total of 6-7 weeks followed by consolidation treatment with immunotherapy with Imfinzi if the patient has no evidence for disease progression after the induction phase. I discussed with the patient the adverse effect of her treatment including but not limited to alopecia, myelosuppression, nausea and vomiting, peripheral neuropathy, liver or renal dysfunction. I will also send her tissue block to be tested for molecular studies and PD-L1 expression. The patient and her family are interested in proceeding with the treatment.  She is expected to start the first cycle of this treatment on July 04, 2020. I will refer the patient to radiation oncology for discussion of the radiotherapy option. I will also arrange for the patient to have a chemotherapy education class before the first dose of her treatment. I will send prescription for Compazine 10 mg p.o. every 6 hours as needed for nausea to her pharmacy. The patient will come back for follow-up visit 1 week after her  treatment for evaluation and management of any adverse effect of her treatment. The patient was advised to call immediately if she has any other concerning symptoms in the interval.  The patient voices understanding of current disease status and treatment options and is in agreement with the current care plan.  All questions were answered. The patient knows to call the clinic with any problems, questions or concerns. We can certainly see the patient much sooner if necessary.  Thank you so much for allowing me to participate in the care of INAYA GILLHAM. I will continue to follow up the patient with you and assist in her care.  The total time spent in the appointment was 90 minutes.  Disclaimer: This note was dictated with voice recognition software. Similar sounding words can inadvertently be transcribed and may not be corrected upon review.   Eilleen Kempf June 22, 2020, 2:30 PM

## 2020-06-22 NOTE — Telephone Encounter (Signed)
Per Dr. [name], I notified pathology to send foundation one and pdl1 on Angelica [name]'s recent bx.

## 2020-06-22 NOTE — Research (Signed)
[name] - Customer service manager for the Discovery and Validation of Biomarkers for the Prediction, Diagnosis, and Management of Disease  06/22/20  Study Introduction:  Patient Angelica [name] was referred by Dr. [name] as a potential candidate for the above listed study.  This Clinical Research Coordinator met with JHANIYA BRISKI, [id number], on 06/22/20 in a manner and location that ensures patient privacy to discuss participation in the above listed research study.  Patient is Accompanied by her daughter and son.  A copy of the informed consent document and separate HIPAA Authorization was provided to the patient.  Patient reads, speaks, and understands [place].    Patient was provided with the business card of this Coordinator and encouraged to contact the research team with any questions.  Approximately 15 minutes were spent with the patient reviewing the informed consent documents.  Patient was provided the option of taking informed consent documents home to review and was encouraged to review at their convenience with their support network, including other care providers. Patient took the consent documents home to review.   Plan:  Will follow up with patient early next week to check interest in study.  If she is interested, will tentatively plan to review consent after chemo education appointment and have labs drawn on 1st day of treatment prior to infusion.  She was thanked for her time and urged to call with any questions.  Clabe Seal Clinical Research Coordinator I  06/22/20 4:16 PM

## 2020-06-23 ENCOUNTER — Other Ambulatory Visit: Payer: Self-pay | Admitting: *Deleted

## 2020-06-23 ENCOUNTER — Encounter: Payer: Self-pay | Admitting: *Deleted

## 2020-06-23 DIAGNOSIS — Z48815 Encounter for surgical aftercare following surgery on the digestive system: Secondary | ICD-10-CM | POA: Diagnosis not present

## 2020-06-23 DIAGNOSIS — U071 COVID-19: Secondary | ICD-10-CM | POA: Diagnosis not present

## 2020-06-23 DIAGNOSIS — M19041 Primary osteoarthritis, right hand: Secondary | ICD-10-CM | POA: Diagnosis not present

## 2020-06-23 DIAGNOSIS — E049 Nontoxic goiter, unspecified: Secondary | ICD-10-CM | POA: Diagnosis not present

## 2020-06-23 DIAGNOSIS — J4550 Severe persistent asthma, uncomplicated: Secondary | ICD-10-CM | POA: Diagnosis not present

## 2020-06-23 DIAGNOSIS — K565 Intestinal adhesions [bands], unspecified as to partial versus complete obstruction: Secondary | ICD-10-CM | POA: Diagnosis not present

## 2020-06-23 DIAGNOSIS — I10 Essential (primary) hypertension: Secondary | ICD-10-CM | POA: Diagnosis not present

## 2020-06-23 DIAGNOSIS — M170 Bilateral primary osteoarthritis of knee: Secondary | ICD-10-CM | POA: Diagnosis not present

## 2020-06-23 DIAGNOSIS — R918 Other nonspecific abnormal finding of lung field: Secondary | ICD-10-CM | POA: Diagnosis not present

## 2020-06-23 NOTE — Progress Notes (Signed)
The proposed treatment discussed in cancer conference is for discussion purpose only and is not a binding recommendation. The patient was not physically examined nor present for their treatment options. Therefore, final treatment plans cannot be decided.  ?

## 2020-06-23 NOTE — Progress Notes (Signed)
I followed up on Ms. Loth's schedule. She is not set up with rad onc at this time. I contacted them with an update of referral.

## 2020-06-24 ENCOUNTER — Telehealth: Payer: Self-pay | Admitting: Allergy

## 2020-06-24 NOTE — Telephone Encounter (Signed)
Patient's daughter called regarding her Fasenra injection on Tuesday, 2-8. Patient was recently diagnosed with lung cancer and she would like to know how the Fasenra injection would effect that. Should she continue or stop for now. Patient has an appointment with a radiologist next Wednesday and a chemo specialist next Friday.  Please advise.

## 2020-06-24 NOTE — Telephone Encounter (Signed)
Left a message for Angelica [name] to call back.

## 2020-06-24 NOTE — Telephone Encounter (Signed)
Patient's daughter called back and has been informed of this information. She is calling the oncologist to find out about proceeding or stopping the [place]. She will call back and let us know.

## 2020-06-24 NOTE — Telephone Encounter (Signed)
Called and spoke to pt's daughter. Appt scheduled with Dr. Valeta Harms on 08/24/2020. Pts daughter verbalized understanding and denied any further questions or concerns at this time.

## 2020-06-24 NOTE — Telephone Encounter (Signed)
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  However I would advise that she discuss Berna Bue use with her oncologist and or the pulmonologist if she has both.    Berna Bue has been controlling her asthma very well and would not want to potentially worsen her asthma control while she is undergoing cancer therapy.  If her oncologist believes she should not take Berna Bue while she is on her cancer therapy then we can stop it until she is in remission and is able to resume again.

## 2020-06-25 NOTE — Progress Notes (Signed)
Thoracic Location of Tumor / Histology:  Biopsies of 06/10/2020  PATIENT: Angelica [name]  Non-Gynecological Cytology Report      Clinical History: None provided     FINAL MICROSCOPIC DIAGNOSIS:   A. 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   B. 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   COMMENT:   B. TTF-1 and Napsin-A are strong and diffusely positive. P40 is  negative. CK5/6 demonstrates patchy nonspecific staining. 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 Results  reported to Dr. June Leap on 06/14/2020. Dr. Vic Ripper reviewed. If  applicable, there is likely sufficient tissue for ancillary studies.   SPECIMEN ADEQUACY:   A. Satisfactory for Evaluation  B. Satisfactory for Evaluation   GROSS:   Received is/are: (A) (1) 2 Slides (1 Quick Stain). (2) 2 Slides (1  Quick Stain). (3) 2 Slides (1 Quick Stain). (4) 2 Slides (1 Quick  Stain). (5) 2 Slides (1 Quick Stain). (B) (1) 2 Slides (1 Quick  Stain). (2) 2 Slides (1 Quick Stain). Also received are 18 cc's of  cloudy red fluid in saline solution from needle rinses. Biopsy/tissue  pieces are included in the cell block. (MA:JB:TC;tc)  Smears: (A) 10 (B) 4  Concentration Method (ThinPrep): (A) 0 (B) 1  Cell Block: (A) 0 (B) 1 Conventional  Additional Studies: N  CYTOLOGY - NON PAP  CASE: [id number]  PATIENT: Angelica [name]  Non-Gynecological Cytology Report      Clinical History: None provided  Specimen Submitted: C. LUNG, LEFT LOWER LOBE, LAVAGE:    FINAL MICROSCOPIC DIAGNOSIS:  - Atypical cells present   SPECIMEN ADEQUACY:  Satisfactory for evaluation   DIAGNOSTIC COMMENTS:  Rare atypical cells are observed and concerning for a more serious  process.   GROSS:  Received is/are 30 cc's of red color fluid (TC:tc)  Smears: 2   Concentration Method (ThinPrep): 0  Cell Block: 0  Additional Studies: N/A   Tobacco/Marijuana/Snuff/ETOH use:   Past/Anticipated interventions by cardiothoracic surgery, if any:  06/10/2020 Leory Plowman I card  VIDEO BRONCHOSCOPY WITH ENDOBRONCHIAL NAVIGATION Bilateral General  VIDEO BRONCHOSCOPY WITH ENDOBRONCHIAL ULTRASOUND    BRONCHIAL BIOPSIES    BRONCHIAL BRUSHINGS    BRONCHIAL NEEDLE ASPIRATION BIOPSIES    BRONCHIAL WASHINGS      Past/Anticipated interventions by medical oncology, if any  Signs/Symptoms  Weight changes, if any: Yes states that she has lost 18 pounds since december  Respiratory complaints, if any: None  Hemoptysis, if any:  None  Pain issues, if any:   NOne  SAFETY ISSUES:  Prior radiation? No  Pacemaker/ICD? No   Possible current pregnancy? No Is the patient on methotrexate? No Patient states that she will monitor her blood pressure and follow up with her primary doctor. Vitals:   06/29/20 0759  BP: (!) 150/82  Pulse: 89  Resp: 18  Temp: 97.8 F (36.6 C)  TempSrc: Temporal  SpO2: 100%  Weight: 75.5 kg  Height: 5\' 5"  (1.651 m)    Current Complaints / other details:

## 2020-06-27 ENCOUNTER — Telehealth: Payer: Self-pay | Admitting: *Deleted

## 2020-06-27 NOTE — Progress Notes (Signed)
Pharmacist Chemotherapy Monitoring - Initial Assessment    Anticipated start date: 07/04/2020   Regimen:  . Are orders appropriate based on the patient's diagnosis, regimen, and cycle? Yes . Does the plan date match the patient's scheduled date? yes . Is the sequencing of drugs appropriate? Yes . Are the premedications appropriate for the patient's regimen? Yes . Prior Authorization for treatment is: Approved o If applicable, is the correct biosimilar selected based on the patient's insurance? not applicable  Organ Function and Labs: Marland Kitchen Are dose adjustments needed based on the patient's renal function, hepatic function, or hematologic function? Yes . Are appropriate labs ordered prior to the start of patient's treatment? Yes . Other organ system assessment, if indicated: N/A . The following baseline labs, if indicated, have been ordered: N/A  Dose Assessment: . Are the drug doses appropriate? Yes . Are the following correct: o Drug concentrations Yes o IV fluid compatible with drug Yes o Administration routes Yes o Timing of therapy Yes . If applicable, does the patient have documented access for treatment and/or plans for port-a-cath placement? no . If applicable, have lifetime cumulative doses been properly documented and assessed? yes Lifetime Dose Tracking  No doses have been documented on this patient for the following tracked chemicals: Doxorubicin, Epirubicin, Idarubicin, Daunorubicin, Mitoxantrone, Bleomycin, Oxaliplatin, Carboplatin, Liposomal Doxorubicin  o   Toxicity Monitoring/Prevention: . The patient has the following take home antiemetics prescribed: Prochlorperazine . The patient has the following take home medications prescribed: N/A . Medication allergies and previous infusion related reactions, if applicable, have been reviewed and addressed. Yes . The patient's current medication list has been assessed for drug-drug interactions with their chemotherapy regimen. no  significant drug-drug interactions were identified on review.  Order Review: . Are the treatment plan orders signed? Yes . Is the patient scheduled to see a provider prior to their treatment? No  I verify that I have reviewed each item in the above checklist and answered each question accordingly.  @RPHNAME @

## 2020-06-27 NOTE — Telephone Encounter (Signed)
[name] daughter Ilean Spradlin calling to confirm fax number.  Request "Intermittent FMLA for self starting 06/29/2020, 07/01/2020 and treatment dates going forward.  Call me to pick up form to take to [place]."  Provided fax number this forms nurse has direct access to [phone]) and to allow seven to ten business days upon receipt to process/complete forms.  Currently no further questions or needs with this call.

## 2020-06-28 ENCOUNTER — Ambulatory Visit: Payer: Self-pay

## 2020-06-28 NOTE — Progress Notes (Signed)
Radiation Oncology         [phone] ________________________________  Initial Outpatient Consultation  Name: Angelica [name] MRN: [id number]  Date: 06/29/2020  DOB: January 30, 1941  NI:OEVO, Hunt Oris, MD  Curt Bears, MD   REFERRING PHYSICIAN: Curt Bears, MD  DIAGNOSIS: The encounter diagnosis was Adenocarcinoma of left lung, stage 3 ([place]).  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 female who is seen as a courtesy of Dr. [name] for an opinion concerning radiation therapy as part of management for her recently diagnosed lung cancer. Today, she is accompanied by her daughter. The patient presented to the ED on 05/17/2020 with chief complaint of abdominal pain. CT scan of chest at that time showed a left lower lobe mass with left hilar and mediastinal adenopathy most suggestive of bronchogenic carcinoma. Additionally, there was a tiny 2-3 mm nodule in the right chest.  Subsequently, the patient was seen by Dr. Valeta Harms, who performed a video bronchoscopy with electromagnetic navigation on 06/10/2020. Cytology from the procedure revealed malignant cells consistent with non-small cell carcinoma of the left lower lobe brushing and fine needle aspiration. Malignant cells consistent with non-small cell carcinoma was also found in the lymph node fine need aspiration.  MRI of brain on 06/14/2020 showed chronic paranasal sinus diease and mild for age cerebral white matter signal changes most commonly due to chronic small vessel disease. However, there was no metastatic disease or acute intracranial abnormality.  PET scan on 06/15/2020 showed a hypermetabolic mass in the medial left lower lobe, consistent with primary bronchogenic carcinoma. There was also noted to be mild hypermetabolic left hilar and mediastinal lymphadenopathy, consistent with metastatic disease. Additionally, there were  sub-centimeter hypermetabolic right level 4 and left level 1B cervical lymph nodes that were suspicious for metastatic disease. There was no evidence of metastatic disease within the abdomen or pelvis.  The patient was seen in consultation with Dr. [name] on 06/22/2020, at which time it was recommended that she proceed with a course of concurrent chemoradiation with weekly Carboplatin and Paclitaxel for a total of 6-7 weeks followed by consolidation treatment with immunotherapy with Imfinzi if there is no evidence of disease progression after the induction phase.  PREVIOUS RADIATION THERAPY: No  PAST MEDICAL HISTORY:  Past Medical History:  Diagnosis Date  . Arthritis   . Asthma   . Bowel obstruction ([place])   . Environmental allergies   . GERD (gastroesophageal reflux disease)   . Glaucoma 05/09/2017  . H. pylori infection   . HLD (hyperlipidemia) 01/16/2019  . Hypertension   . Iron deficiency anemia   . Osteopenia 05/26/2017  . Thyroid disease   . Urticaria 07/25/2018    PAST SURGICAL HISTORY: Past Surgical History:  Procedure Laterality Date  . ABDOMINAL HYSTERECTOMY    . BOWEL RESECTION N/A 05/23/2020   Procedure: SMALL BOWEL REPAIR;  Surgeon: Kinsinger, Arta Bruce, MD;  Location: [facility];  Service: General;  Laterality: N/A;  . BREAST BIOPSY    . BRONCHIAL BIOPSY  06/10/2020   Procedure: BRONCHIAL BIOPSIES;  Surgeon: Garner Nash, DO;  Location: [facility];  Service: Pulmonary;;  . BRONCHIAL BRUSHINGS  06/10/2020   Procedure: BRONCHIAL BRUSHINGS;  Surgeon: Garner Nash, DO;  Location: [place];  Service: Pulmonary;;  . BRONCHIAL NEEDLE ASPIRATION BIOPSY  06/10/2020   Procedure: BRONCHIAL NEEDLE ASPIRATION BIOPSIES;  Surgeon: Garner Nash, DO;  Location: [place];  Service: Pulmonary;;  . BRONCHIAL WASHINGS  06/10/2020   Procedure: BRONCHIAL WASHINGS;  Surgeon: Garner Nash, DO;  Location: [facility];  Service: Pulmonary;;  . COLON SURGERY    . DIAGNOSTIC  LARYNGOSCOPY N/A 05/23/2020   Procedure: ATTEMPTED DIAGNOSTIC LAPAROSCOPY WITH ADHESIONS;  Surgeon: Kieth Brightly, Arta Bruce, MD;  Location: [place];  Service: General;  Laterality: N/A;  . KNEE SURGERY    . LAPAROTOMY N/A 04/18/2015   Procedure: Exploratory laparotomy with lysis of adhesions, possible bowel resection;  Surgeon: Ralene Ok, MD;  Location: [place];  Service: General;  Laterality: N/A;  . LAPAROTOMY N/A 05/23/2020   Procedure: EXPLORATORY LAPAROTOMY;  Surgeon: Kieth Brightly Arta Bruce, MD;  Location: [place];  Service: General;  Laterality: N/A;  . LYSIS OF ADHESION N/A 05/23/2020   Procedure: LYSIS OF ADHESION;  Surgeon: Kieth Brightly Arta Bruce, MD;  Location: [place];  Service: General;  Laterality: N/A;  . NASAL SINUS SURGERY    . THYROID SURGERY    . VIDEO BRONCHOSCOPY WITH ENDOBRONCHIAL NAVIGATION Bilateral 06/10/2020   Procedure: VIDEO BRONCHOSCOPY WITH ENDOBRONCHIAL NAVIGATION;  Surgeon: Garner Nash, DO;  Location: [place];  Service: Pulmonary;  Laterality: Bilateral;  . VIDEO BRONCHOSCOPY WITH ENDOBRONCHIAL ULTRASOUND  06/10/2020   Procedure: VIDEO BRONCHOSCOPY WITH ENDOBRONCHIAL ULTRASOUND;  Surgeon: Garner Nash, DO;  Location: [facility];  Service: Pulmonary;;    FAMILY HISTORY:  Family History  Problem Relation Age of Onset  . Diabetes Mother   . Hypertension Mother   . Hypertension Father   . Glaucoma Sister   . Glaucoma Brother   . Allergic rhinitis Neg Hx   . Angioedema Neg Hx   . Asthma Neg Hx   . Eczema Neg Hx   . Immunodeficiency Neg Hx   . Urticaria Neg Hx     SOCIAL HISTORY:  Social History   Tobacco Use  . Smoking status: Former Smoker    Types: Cigarettes  . Smokeless tobacco: Never Used  Vaping Use  . Vaping Use: Never used  Substance Use Topics  . Alcohol use: No  . Drug use: No    ALLERGIES:  Allergies  Allergen Reactions  . Asa Buff (Mag [Buffered Aspirin] Other (See Comments)    Excessive sweating  . Fish Allergy Other (See  Comments)    Unknown reaction, but allergic  . Milk-Related Compounds   . Other Other (See Comments)    Gelcaps; gel-containing capsules; extended-release medication  . Peanut-Containing Drug Products Other (See Comments)    From allergy test  . Penicillins Itching    Has patient had a PCN reaction causing immediate rash, facial/tongue/throat swelling, SOB or lightheadedness with hypotension: No Has patient had a PCN reaction causing severe rash involving mucus membranes or skin necrosis: No Has patient had a PCN reaction that required hospitalization No Has patient had a PCN reaction occurring within the last 10 years: No If all of the above answers are "NO", then may proceed with Cephalosporin use.  Marland Kitchen Shellfish-Derived Products Other (See Comments)    From allergy test  . Strawberry Extract Other (See Comments)    Worsened allergy symptoms    MEDICATIONS:  Current Outpatient Medications  Medication Sig Dispense Refill  . acetaminophen (TYLENOL) 325 MG tablet Take 2 tablets (650 mg total) by mouth every 6 (six) hours as needed for headache.    Marland Kitchen amLODipine (NORVASC) 10 MG tablet Take 1 tablet (10 mg total) by mouth daily. 90 tablet 0  . budesonide-formoterol (SYMBICORT) 80-4.5 MCG/ACT inhaler Inhale  2 puffs into the lungs 2 (two) times daily. Use for asthma flares 2 puffs twice a day for 2 weeks or until cough and wheeze free (Patient taking differently: Inhale 2 puffs into the lungs 2 (two) times daily as needed (for asthma flares, for 2 weeks, or until cough and wheeze-free).) 10.2 g 5  . Cholecalciferol (VITAMIN D3) 50 MCG (2000 UT) TABS Take 2,000 Units by mouth daily.    Marland Kitchen EPINEPHrine (EPIPEN 2-PAK) 0.3 mg/0.3 mL IJ SOAJ injection Use as directed for severe allergic reaction (Patient taking differently: Inject 0.3 mg into the muscle as needed for anaphylaxis.) 2 each 1  . ipratropium (ATROVENT) 0.06 % nasal spray Place 2 sprays into both nostrils 3 (three) times daily. (Patient  taking differently: Place 1 spray into both nostrils 3 (three) times daily as needed (allergies.).) 15 mL 3  . linaclotide (LINZESS) 72 MCG capsule TAKE 1 CAPSULE BY MOUTH ONCE DAILY AS NEEDED 30 capsule 0  . loratadine (CLARITIN) 10 MG tablet Take 10 mg by mouth daily.    Marland Kitchen losartan (COZAAR) 100 MG tablet Take 1 tablet (100 mg total) by mouth daily. 90 tablet 3  . Magnesium Hydroxide (PHILLIPS MILK OF MAGNESIA PO) Take 15-30 mLs by mouth daily as needed (for constipation).    . pravastatin (PRAVACHOL) 80 MG tablet Take 1 tablet (80 mg total) by mouth daily. 90 tablet 3  . traMADol (ULTRAM) 50 MG tablet Take 1 tablet (50 mg total) by mouth every 6 (six) hours as needed for moderate pain. 15 tablet 0  . vitamin B-12 (CYANOCOBALAMIN) 1000 MCG tablet Take 1,000 mcg by mouth daily.    . Benralizumab (FASENRA PEN) 30 MG/ML SOAJ Inject into the skin every 8 (eight) weeks. (Patient not taking: Reported on 06/29/2020)    . polyethylene glycol powder (GLYCOLAX/MIRALAX) powder Take 17 g by mouth daily as needed for mild constipation (MIX AND DRINK). (Patient not taking: Reported on 06/29/2020)    . prochlorperazine (COMPAZINE) 10 MG tablet Take 1 tablet (10 mg total) by mouth every 6 (six) hours as needed for nausea or vomiting. (Patient not taking: Reported on 06/29/2020) 30 tablet 0   Current Facility-Administered Medications  Medication Dose Route Frequency Provider Last Rate Last Admin  . Benralizumab SOSY 30 mg  30 mg Subcutaneous Q8 Weeks Kennith Gain, MD   30 mg at 05/03/20 4259    REVIEW OF SYSTEMS:  A 10+ POINT REVIEW OF SYSTEMS WAS OBTAINED including neurology, dermatology, psychiatry, cardiac, respiratory, lymph, extremities, GI, GU, musculoskeletal, constitutional, reproductive, HEENT.  Prior to diagnosis patient denied any pain within the chest area significant cough or hemoptysis.  She denies any breathing issues.  She also denies any difficulties with swallowing.  The daughter relates  weight loss related to the patient's recent bowel obstruction and surgery   PHYSICAL EXAM:  height is 5\' 5"  (1.651 m) and weight is 166 lb 8 oz (75.5 kg). Her temporal temperature is 97.8 F (36.6 C). Her blood pressure is 150/82 (abnormal) and her pulse is 89. Her respiration is 18 and oxygen saturation is 100%.   General: Alert and oriented, in no acute distress HEENT: Head is normocephalic. Extraocular movements are intact. Oropharynx is clear.  Dentures in place Neck: Neck is supple, no palpable cervical or supraclavicular lymphadenopathy. Heart: Regular in rate and rhythm with no murmurs, rubs, or gallops. Chest: Clear to auscultation bilaterally, with no rhonchi, wheezes, or rales. Abdomen: Soft, nontender, nondistended, with no rigidity or guarding. Extremities: No cyanosis  or edema. Lymphatics: see Neck Exam Skin: No concerning lesions. Musculoskeletal: symmetric strength and muscle tone throughout. Neurologic: Cranial nerves II through XII are grossly intact. No obvious focalities. Speech is fluent. Coordination is intact. Psychiatric: Judgment and insight are intact. Affect is appropriate. Require some assistance in getting up and down off the examination table  ECOG = 1  0 - Asymptomatic (Fully active, able to carry on all predisease activities without restriction)  1 - Symptomatic but completely ambulatory (Restricted in physically strenuous activity but ambulatory and able to carry out work of a light or sedentary nature. For example, light housework, office work)  2 - Symptomatic, <50% in bed during the day (Ambulatory and capable of all self care but unable to carry out any work activities. Up and about more than 50% of waking hours)  3 - Symptomatic, >50% in bed, but not bedbound (Capable of only limited self-care, confined to bed or chair 50% or more of waking hours)  4 - Bedbound (Completely disabled. Cannot carry on any self-care. Totally confined to bed or chair)  5  - Death   Eustace Pen MM, Creech RH, Tormey DC, et al. [phone]). "Toxicity and response criteria of the [facility] Group". [place] Oncol. 5 (6): 649-55  LABORATORY DATA:  Lab Results  Component Value Date   WBC 9.2 06/22/2020   HGB 12.3 06/22/2020   HCT 38.1 06/22/2020   MCV 92.0 06/22/2020   PLT 271 06/22/2020   NEUTROABS 5.5 06/22/2020   Lab Results  Component Value Date   NA 141 06/22/2020   K 3.3 (L) 06/22/2020   CL 104 06/22/2020   CO2 26 06/22/2020   GLUCOSE 121 (H) 06/22/2020   CREATININE 0.87 06/22/2020   CALCIUM 9.4 06/22/2020      RADIOGRAPHY: MR BRAIN W WO CONTRAST  Result Date: 06/15/2020 CLINICAL DATA:  79 year old female with non-small cell lung cancer diagnosed in the past two months. Staging. EXAM: MRI HEAD WITHOUT AND WITH CONTRAST TECHNIQUE: Multiplanar, multiecho pulse sequences of the brain and surrounding structures were obtained without and with intravenous contrast. CONTRAST:  22mL MULTIHANCE GADOBENATE DIMEGLUMINE 529 MG/ML IV SOLN COMPARISON:  Head CTs 01/21/2020 and earlier. FINDINGS: Brain: No abnormal enhancement identified. No dural thickening identified. No restricted diffusion to suggest acute infarction. No midline shift, mass effect, evidence of mass lesion, ventriculomegaly, extra-axial collection or acute intracranial hemorrhage. Cervicomedullary junction and pituitary are within normal limits. Mild for age mostly periventricular white matter T2 and FLAIR hyperintensity in a nonspecific configuration. No cortical encephalomalacia or chronic cerebral blood products identified. Deep gray matter nuclei, brainstem and cerebellum appear negative. Vascular: Major intracranial vascular flow voids are preserved. The major dural venous sinuses are enhancing and appear to be patent. Skull and upper cervical spine: Visible cervical spine is normal for age. Visualized bone marrow signal is within normal limits. Sinuses/Orbits: Postoperative changes to  both globes. Widespread paranasal sinus mucosal thickening and opacification. Similar sinus disease on prior head CTs. No complicating features. Other: Mastoid air cells are well aerated, trace fluid on the right. Grossly normal visible internal auditory structures. Negative visible nasopharynx. Visible scalp and face soft tissues appear negative. IMPRESSION: 1. No metastatic disease or acute intracranial abnormality. 2. Mild for age cerebral white matter signal changes most commonly due to chronic small vessel disease. 3. Chronic paranasal sinus disease. Electronically Signed   By: Genevie Ann M.D.   On: 06/15/2020 05:58   NM PET Image Initial (PI) Skull Base To Thigh  Result Date: 06/15/2020 CLINICAL DATA:  Initial treatment strategy for non-small cell lung carcinoma. EXAM: NUCLEAR MEDICINE PET SKULL BASE TO THIGH TECHNIQUE: 8.6 mCi F-18 FDG was injected intravenously. Full-ring PET imaging was performed from the skull base to thigh after the radiotracer. CT data was obtained and used for attenuation correction and anatomic localization. Fasting blood glucose: 91 mg/dl COMPARISON:  CT on 05/17/2020 FINDINGS: Mediastinal blood-pool activity (background): SUV max = 2.8 Liver activity (reference): SUV max = N/A NECK: A 6 mm right low jugular/level 4 lymph node is seen on image 38/4, which is hypermetabolic with SUV max of 4.9. a 5 mm left level 1B submandibular lymph node is seen which also shows hypermetabolic activity, with SUV max of 3.4. Diffusely enlarged thyroid gland is seen with multiple bilateral hypermetabolic nodules. Incidental CT findings:  None. CHEST: Hypermetabolic mass is seen in the medial left lower lobe which measures 3.6 x 2.4 cm, and has SUV max of 13.7. A small hypermetabolic left hilar lymph node is seen with SUV max of 4.6. Hypermetabolic mediastinal lymph nodes are seen in the right paratracheal and subcarinal regions, with index lymph node measuring 1.7 cm on image 66/4 with SUV max of  11.2. Incidental CT findings:  None. ABDOMEN/PELVIS: No abnormal hypermetabolic activity within the liver, pancreas, adrenal glands, or spleen. No hypermetabolic lymph nodes in the abdomen or pelvis. Incidental CT findings: Prior hysterectomy noted. Aortic atherosclerotic calcification noted. SKELETON: No focal hypermetabolic bone lesions to suggest skeletal metastasis. Incidental CT findings:  None. IMPRESSION: Hypermetabolic mass in the medial left lower lobe, consistent with primary bronchogenic carcinoma. Mild hypermetabolic left hilar and mediastinal lymphadenopathy, consistent with metastatic disease. Sub-cm hypermetabolic right level 4 and left level 1B cervical lymph nodes, suspicious for metastatic disease. No evidence of metastatic disease within the abdomen or pelvis. Enlarged heterogeneous thyroid gland with bilateral heterogeneous hypermetabolic activity. Recommend thyroid ultrasound. (ref: J Am Coll Radiol. 2015 Feb;12(2): 143-50). Electronically Signed   By: Marlaine Hind M.D.   On: 06/15/2020 10:50   DG CHEST PORT 1 VIEW  Result Date: 06/10/2020 CLINICAL DATA:  Status post bronchoscopy, history of left lower lobe mass lesion. EXAM: PORTABLE CHEST 1 VIEW COMPARISON:  05/17/2020 FINDINGS: Cardiac shadow is stable. Hypoinflation is seen. No pneumothorax is noted. The known left lower lobe mass lesion is not well appreciated due to elevation of the left hemidiaphragm. No bony abnormality is seen. IMPRESSION: No evidence of post bronchoscopy pneumothorax. Electronically Signed   By: Inez Catalina M.D.   On: 06/10/2020 15:23   DG Abd Portable 1V  Result Date: 05/31/2020 CLINICAL DATA:  Postop state. EXAM: PORTABLE ABDOMEN - 1 VIEW COMPARISON:  05/23/2020. FINDINGS: Surgical staples noted over the abdomen. Gastric distention. Distention of multiple loops of small bowel noted on today's exam. Single large loop of dilated small bowel in the right upper abdomen is less dilated on today's exam. Colon is  nondistended. No free air identified. Degenerative change thoracolumbar spine. IMPRESSION: 1. Gastric distention. 2. Distention of multiple loops of small bowel noted on today's exam. Single large loop of dilated small bowel in the right upper abdomen is less dilated on today's exam. Colon is nondistended. No free air identified. Continued follow-up exam suggested to demonstrate resolution of gastric and small bowel distention. Electronically Signed   By: Marcello Moores  Register   On: 05/31/2020 14:57   DG C-ARM BRONCHOSCOPY  Result Date: 06/10/2020 C-ARM BRONCHOSCOPY: Fluoroscopy was utilized by the requesting physician.  No radiographic interpretation.  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  She has disease in the left lower lung at her primary site as well as mediastinal and low neck involvement by PET scan.  It will be difficult to cover this with conventional 3D planning and I am recommending IMRT for her treatment.  Given the length of the esophagus involved with her radiation therapy we also discussed that she will likely have significant esophageal symptoms towards the latter portion of her radiation therapy.  Will provide symptomatic medications as necessary for this issue.  Today, I talked to the patient and daughter about the findings and work-up thus far.  We discussed the natural history of lung cancer and general treatment, highlighting the role of radiotherapy in the management.  We discussed the available radiation techniques, and focused on the details of logistics and delivery.  We reviewed the anticipated acute and late sequelae associated with radiation in this setting.  The patient was encouraged to ask questions that I answered to the best of my ability.  A patient consent form was discussed and signed.   We retained a copy for our records.  The patient would like to proceed with radiation and will be scheduled for CT simulation.  PLAN: Patient will undergo CT simulation this afternoon and return early next week to start her chest radiation therapy.  The patient will also begin weekly low-dose radiosensitizing chemotherapy on February 14.  Anticipate 6 weeks of radiation therapy as part of her overall management.  After she completes her combined modality therapy the patient will then proceed with immunotherapy pending results of CT scan to rule out progression.  Total time spent in this encounter was 60 minutes which included reviewing the patient's most recent CT scan of chest, MRI of brain, PET scan, bronchoscopy, cytology report, consultations, physical examination, and documentation.   ------------------------------------------------  Blair Promise, PhD, MD  This document serves as a record of services personally performed by Gery Pray, MD. It was created on his behalf by Clerance Lav, a trained medical scribe. The creation of this record is based on the scribe's personal observations and the provider's statements to them. This document has been checked and approved by the attending provider.

## 2020-06-29 ENCOUNTER — Ambulatory Visit
Admission: RE | Admit: 2020-06-29 | Discharge: 2020-06-29 | Disposition: A | Discharge status: Home / Self Care | Payer: Medicare HMO | Source: Ambulatory Visit | Attending: Radiation Oncology | Admitting: Radiation Oncology

## 2020-06-29 ENCOUNTER — Telehealth: Payer: Self-pay | Admitting: Emergency Medicine

## 2020-06-29 ENCOUNTER — Encounter: Payer: Self-pay | Admitting: Radiation Oncology

## 2020-06-29 ENCOUNTER — Other Ambulatory Visit: Payer: Self-pay

## 2020-06-29 ENCOUNTER — Encounter: Payer: Self-pay | Admitting: *Deleted

## 2020-06-29 ENCOUNTER — Encounter: Payer: Self-pay | Admitting: Emergency Medicine

## 2020-06-29 ENCOUNTER — Encounter: Payer: Medicare HMO | Admitting: Emergency Medicine

## 2020-06-29 ENCOUNTER — Other Ambulatory Visit: Payer: Self-pay | Admitting: *Deleted

## 2020-06-29 ENCOUNTER — Telehealth: Payer: Self-pay | Admitting: Internal Medicine

## 2020-06-29 VITALS — BP 150/82 | HR 89 | Temp 97.8°F | Resp 18 | Ht 65.0 in | Wt 166.5 lb

## 2020-06-29 DIAGNOSIS — M8580 Other specified disorders of bone density and structure, unspecified site: Secondary | ICD-10-CM | POA: Insufficient documentation

## 2020-06-29 DIAGNOSIS — K3189 Other diseases of stomach and duodenum: Secondary | ICD-10-CM | POA: Insufficient documentation

## 2020-06-29 DIAGNOSIS — E785 Hyperlipidemia, unspecified: Secondary | ICD-10-CM | POA: Insufficient documentation

## 2020-06-29 DIAGNOSIS — M129 Arthropathy, unspecified: Secondary | ICD-10-CM | POA: Diagnosis not present

## 2020-06-29 DIAGNOSIS — Z79899 Other long term (current) drug therapy: Secondary | ICD-10-CM | POA: Diagnosis not present

## 2020-06-29 DIAGNOSIS — C771 Secondary and unspecified malignant neoplasm of intrathoracic lymph nodes: Secondary | ICD-10-CM | POA: Insufficient documentation

## 2020-06-29 DIAGNOSIS — I10 Essential (primary) hypertension: Secondary | ICD-10-CM | POA: Diagnosis not present

## 2020-06-29 DIAGNOSIS — Z510 Encounter for antineoplastic radiation therapy: Secondary | ICD-10-CM | POA: Diagnosis not present

## 2020-06-29 DIAGNOSIS — D509 Iron deficiency anemia, unspecified: Secondary | ICD-10-CM | POA: Insufficient documentation

## 2020-06-29 DIAGNOSIS — J45909 Unspecified asthma, uncomplicated: Secondary | ICD-10-CM | POA: Insufficient documentation

## 2020-06-29 DIAGNOSIS — C3492 Malignant neoplasm of unspecified part of left bronchus or lung: Secondary | ICD-10-CM

## 2020-06-29 DIAGNOSIS — Z006 Encounter for examination for normal comparison and control in clinical research program: Secondary | ICD-10-CM

## 2020-06-29 DIAGNOSIS — K219 Gastro-esophageal reflux disease without esophagitis: Secondary | ICD-10-CM | POA: Diagnosis not present

## 2020-06-29 DIAGNOSIS — C3432 Malignant neoplasm of lower lobe, left bronchus or lung: Secondary | ICD-10-CM | POA: Insufficient documentation

## 2020-06-29 DIAGNOSIS — Z87891 Personal history of nicotine dependence: Secondary | ICD-10-CM | POA: Diagnosis not present

## 2020-06-29 NOTE — Telephone Encounter (Signed)
[name] - Customer service manager for the Discovery and Validation of Biomarkers for the Prediction, Diagnosis, and Management of Disease  06/29/20  I returned a call to the patient's daughter, [name].  She states that the patient is interested in the study.  Patient is coming in this afternoon for CT simulation in radiation oncology.    Scheduled appointment with patient for consent visit today at 3:30pm.  Clabe Seal Clinical Research Coordinator I  06/29/20 11:07 AM

## 2020-06-29 NOTE — Research (Signed)
[name] - Customer service manager for the Discovery and Validation of Biomarkers for the Prediction, Diagnosis, and Management of Disease  06/29/20  Eligibility:  This Research officer, political party reviewed eligibility criteria and this patient is eligible for this study.  Secondary eligibility review was performed by Foye Spurling, Clinical Research Nurse.  Consent:  Patient Angelica [name] was identified by this research coordiantor as a potential candidate for the above listed study.  This Clinical Research Coordinator met with MAIGEN MOZINGO, MRN [id number] on 06/29/20 in a manner and location that ensures patient privacy to discuss participation in the above listed research study.  Patient is accompanied by her daughter, [name].  Patient was previously provided with informed consent documents.  Patient confirmed they have read the informed consent documents.  As outlined in the informed consent form, this Coordinator and RANESHIA DERICK discussed the purpose of the research study, the investigational nature of the study, study procedures and requirements for study participation, potential risks and benefits of study participation, as well as alternatives to participation.  The patient understands participation is voluntary and they may withdraw from study participation at any time.  This study does not involve randomization.  This study does not involve an investigational drug or device. This study does not involve a placebo. Patient understands enrollment is pending full eligibility review.   Confidentiality and how the patient's information will be used as part of study participation were discussed.  Patient was informed there is reimbursement provided for their time and effort spent on trial participation in the form of a $50 visa gift card.  The patient is encouraged to discuss research study participation with their insurance provider to determine what costs they may incur as part  of study participation, including research related injury.    All questions were answered to patient's satisfaction.  The informed consent and separate HIPAA Authorization was reviewed page by page.  The patient's mental and emotional status is appropriate to provide informed consent, and the patient verbalizes an understanding of study participation.  Patient has agreed to participate in the above listed research study and has voluntarily signed the informed consent version 2 and separate HIPAA Authorization, version 5 on 06/29/20 at 1510.  The patient was provided with a copy of the signed informed consent form and separate HIPAA Authorization for their reference.  No study specific procedures were obtained prior to the signing of the informed consent document.  Approximately 20 minutes were spent with the patient reviewing the informed consent documents.  Patient was not requested to complete a Release of Information form.   Plan:  Will plan to have patient's research labs drawn at already scheduled lab appointment Monday, 07/04/20, at 8:00am.   She was thanked for her time and participation.  Clabe Seal Clinical Research Coordinator I  06/29/20 3:26 PM

## 2020-06-29 NOTE — Telephone Encounter (Signed)
These are the recent BPs when I last saw her on Jan 22    BP Readings from Last 3 Encounters:  06/14/20 140/82  06/10/20 113/71  06/09/20 118/70   These are more recent when I was not involved in her care and did not know what her situation was at the time:  BP Readings from Last 3 Encounters:  06/29/20 (!) 150/82  06/22/20 (!) 144/79  06/14/20 140/82   It does appear the most recent 2 BP are elevated, but since I dont know the situation, I would be uncomfortable saying she needs more medication, as some elevated BP are a temporary reaction to what was going on at the time

## 2020-06-29 NOTE — Telephone Encounter (Signed)
Patient's daughter called and said that the patients BP has been elevated her last few doctors appointments. She said that this morning  it was 150/101. She was wondering if the patients BP medication needed to be changed. Please advise. Call [phone]

## 2020-06-29 NOTE — Progress Notes (Signed)
I followed up on Angelica [name]'s schedule. She is set up for her treatment plan.

## 2020-06-30 ENCOUNTER — Encounter: Payer: Self-pay | Admitting: Internal Medicine

## 2020-06-30 ENCOUNTER — Ambulatory Visit (INDEPENDENT_AMBULATORY_CARE_PROVIDER_SITE_OTHER): Payer: Medicare HMO | Admitting: Internal Medicine

## 2020-06-30 VITALS — BP 124/80 | HR 87 | Temp 97.9°F | Ht 65.0 in | Wt 167.0 lb

## 2020-06-30 DIAGNOSIS — R918 Other nonspecific abnormal finding of lung field: Secondary | ICD-10-CM | POA: Diagnosis not present

## 2020-06-30 DIAGNOSIS — E049 Nontoxic goiter, unspecified: Secondary | ICD-10-CM | POA: Diagnosis not present

## 2020-06-30 DIAGNOSIS — Z48815 Encounter for surgical aftercare following surgery on the digestive system: Secondary | ICD-10-CM | POA: Diagnosis not present

## 2020-06-30 DIAGNOSIS — U071 COVID-19: Secondary | ICD-10-CM | POA: Diagnosis not present

## 2020-06-30 DIAGNOSIS — I10 Essential (primary) hypertension: Secondary | ICD-10-CM | POA: Diagnosis not present

## 2020-06-30 DIAGNOSIS — M170 Bilateral primary osteoarthritis of knee: Secondary | ICD-10-CM | POA: Diagnosis not present

## 2020-06-30 DIAGNOSIS — J4550 Severe persistent asthma, uncomplicated: Secondary | ICD-10-CM | POA: Diagnosis not present

## 2020-06-30 DIAGNOSIS — E7849 Other hyperlipidemia: Secondary | ICD-10-CM | POA: Diagnosis not present

## 2020-06-30 DIAGNOSIS — R739 Hyperglycemia, unspecified: Secondary | ICD-10-CM | POA: Diagnosis not present

## 2020-06-30 DIAGNOSIS — K565 Intestinal adhesions [bands], unspecified as to partial versus complete obstruction: Secondary | ICD-10-CM | POA: Diagnosis not present

## 2020-06-30 DIAGNOSIS — M19041 Primary osteoarthritis, right hand: Secondary | ICD-10-CM | POA: Diagnosis not present

## 2020-06-30 NOTE — Progress Notes (Signed)
Patient ID: Angelica [name], female   DOB: 03-Feb-1941, 80 y.o.   MRN: [id number]        Chief Complaint: follow up HTN, HLD and hyperglycemia        HPI:  Angelica [name] is a 80 y.o. female here with above with family , overall doing ok;  Has no specific complaints.  BP improved today it seems and has been up and down lately.  Good compliance with meds.  Denies CP, sob, orthopnea, PND, increased LE swelling, palpitations, dizziness or syncope. Denies specific neurological symptoms such as focal neuro signs of weakness or numbness.   Pt denies polydipsia, polyuria, .        Wt Readings from Last 3 Encounters:  06/30/20 167 lb (75.8 kg)  06/29/20 166 lb 8 oz (75.5 kg)  06/22/20 168 lb 3.2 oz (76.3 kg)   BP Readings from Last 3 Encounters:  06/30/20 124/80  06/29/20 (!) 150/82  06/22/20 (!) 144/79         Past Medical History:  Diagnosis Date  . Arthritis   . Asthma   . Bowel obstruction ([place])   . Environmental allergies   . GERD (gastroesophageal reflux disease)   . Glaucoma 05/09/2017  . H. pylori infection   . HLD (hyperlipidemia) 01/16/2019  . Hypertension   . Iron deficiency anemia   . Osteopenia 05/26/2017  . Thyroid disease   . Urticaria 07/25/2018   Past Surgical History:  Procedure Laterality Date  . ABDOMINAL HYSTERECTOMY    . BOWEL RESECTION N/A 05/23/2020   Procedure: SMALL BOWEL REPAIR;  Surgeon: Kinsinger, Arta Bruce, MD;  Location: [facility];  Service: General;  Laterality: N/A;  . BREAST BIOPSY    . BRONCHIAL BIOPSY  06/10/2020   Procedure: BRONCHIAL BIOPSIES;  Surgeon: Garner Nash, DO;  Location: [facility];  Service: Pulmonary;;  . BRONCHIAL BRUSHINGS  06/10/2020   Procedure: BRONCHIAL BRUSHINGS;  Surgeon: Garner Nash, DO;  Location: [facility];  Service: Pulmonary;;  . BRONCHIAL NEEDLE ASPIRATION BIOPSY  06/10/2020   Procedure: BRONCHIAL NEEDLE ASPIRATION BIOPSIES;  Surgeon: Garner Nash, DO;  Location: [facility];  Service: Pulmonary;;  .  BRONCHIAL WASHINGS  06/10/2020   Procedure: BRONCHIAL WASHINGS;  Surgeon: Garner Nash, DO;  Location: [facility];  Service: Pulmonary;;  . COLON SURGERY    . DIAGNOSTIC LARYNGOSCOPY N/A 05/23/2020   Procedure: ATTEMPTED DIAGNOSTIC LAPAROSCOPY WITH ADHESIONS;  Surgeon: Kieth Brightly, Arta Bruce, MD;  Location: [facility];  Service: General;  Laterality: N/A;  . KNEE SURGERY    . LAPAROTOMY N/A 04/18/2015   Procedure: Exploratory laparotomy with lysis of adhesions, possible bowel resection;  Surgeon: Ralene Ok, MD;  Location: [facility];  Service: General;  Laterality: N/A;  . LAPAROTOMY N/A 05/23/2020   Procedure: EXPLORATORY LAPAROTOMY;  Surgeon: Kieth Brightly Arta Bruce, MD;  Location: [facility];  Service: General;  Laterality: N/A;  . LYSIS OF ADHESION N/A 05/23/2020   Procedure: LYSIS OF ADHESION;  Surgeon: Kieth Brightly Arta Bruce, MD;  Location: [facility];  Service: General;  Laterality: N/A;  . NASAL SINUS SURGERY    . THYROID SURGERY    . VIDEO BRONCHOSCOPY WITH ENDOBRONCHIAL NAVIGATION Bilateral 06/10/2020   Procedure: VIDEO BRONCHOSCOPY WITH ENDOBRONCHIAL NAVIGATION;  Surgeon: Garner Nash, DO;  Location: [facility];  Service: Pulmonary;  Laterality: Bilateral;  . VIDEO BRONCHOSCOPY WITH ENDOBRONCHIAL ULTRASOUND  06/10/2020   Procedure: VIDEO BRONCHOSCOPY WITH ENDOBRONCHIAL ULTRASOUND;  Surgeon: Garner Nash, DO;  Location: [facility];  Service: Pulmonary;;  reports that she has quit smoking. Her smoking use included cigarettes. She has never used smokeless tobacco. She reports that she does not drink alcohol and does not use drugs. family history includes Diabetes in her mother; Glaucoma in her brother and sister; Hypertension in her father and mother. Allergies  Allergen Reactions  . Asa Buff (Mag [Buffered Aspirin] Other (See Comments)    Excessive sweating  . Fish Allergy Other (See Comments)    Unknown reaction, but allergic  . Milk-Related Compounds   . Other Other (See Comments)     Gelcaps; gel-containing capsules; extended-release medication  . Peanut-Containing Drug Products Other (See Comments)    From allergy test  . Penicillins Itching    Has patient had a PCN reaction causing immediate rash, facial/tongue/throat swelling, SOB or lightheadedness with hypotension: No Has patient had a PCN reaction causing severe rash involving mucus membranes or skin necrosis: No Has patient had a PCN reaction that required hospitalization No Has patient had a PCN reaction occurring within the last 10 years: No If all of the above answers are "NO", then may proceed with Cephalosporin use.  Marland Kitchen Shellfish-Derived Products Other (See Comments)    From allergy test  . Strawberry Extract Other (See Comments)    Worsened allergy symptoms   Current Outpatient Medications on File Prior to Visit  Medication Sig Dispense Refill  . acetaminophen (TYLENOL) 325 MG tablet Take 2 tablets (650 mg total) by mouth every 6 (six) hours as needed for headache.    Marland Kitchen amLODipine (NORVASC) 10 MG tablet Take 1 tablet (10 mg total) by mouth daily. 90 tablet 0  . budesonide-formoterol (SYMBICORT) 80-4.5 MCG/ACT inhaler Inhale 2 puffs into the lungs 2 (two) times daily. Use for asthma flares 2 puffs twice a day for 2 weeks or until cough and wheeze free (Patient taking differently: Inhale 2 puffs into the lungs 2 (two) times daily as needed (for asthma flares, for 2 weeks, or until cough and wheeze-free).) 10.2 g 5  . Cholecalciferol (VITAMIN D3) 50 MCG (2000 UT) TABS Take 2,000 Units by mouth daily.    Marland Kitchen EPINEPHrine (EPIPEN 2-PAK) 0.3 mg/0.3 mL IJ SOAJ injection Use as directed for severe allergic reaction (Patient taking differently: Inject 0.3 mg into the muscle as needed for anaphylaxis.) 2 each 1  . ipratropium (ATROVENT) 0.06 % nasal spray Place 2 sprays into both nostrils 3 (three) times daily. (Patient taking differently: Place 1 spray into both nostrils 3 (three) times daily as needed (allergies.).) 15 mL 3   . linaclotide (LINZESS) 72 MCG capsule TAKE 1 CAPSULE BY MOUTH ONCE DAILY AS NEEDED 30 capsule 0  . loratadine (CLARITIN) 10 MG tablet Take 10 mg by mouth daily.    Marland Kitchen losartan (COZAAR) 100 MG tablet Take 1 tablet (100 mg total) by mouth daily. 90 tablet 3  . Magnesium Hydroxide (PHILLIPS MILK OF MAGNESIA PO) Take 15-30 mLs by mouth daily as needed (for constipation).    . pravastatin (PRAVACHOL) 80 MG tablet Take 1 tablet (80 mg total) by mouth daily. 90 tablet 3  . prochlorperazine (COMPAZINE) 10 MG tablet Take 1 tablet (10 mg total) by mouth every 6 (six) hours as needed for nausea or vomiting. 30 tablet 0  . traMADol (ULTRAM) 50 MG tablet Take 1 tablet (50 mg total) by mouth every 6 (six) hours as needed for moderate pain. 15 tablet 0  . vitamin B-12 (CYANOCOBALAMIN) 1000 MCG tablet Take 1,000 mcg by mouth daily.    Maryjean Ka [facility]  PEN) 30 MG/ML SOAJ Inject into the skin every 8 (eight) weeks. (Patient not taking: No sig reported)    . polyethylene glycol powder (GLYCOLAX/MIRALAX) powder Take 17 g by mouth daily as needed for mild constipation (MIX AND DRINK). (Patient not taking: Reported on 06/30/2020)     Current Facility-Administered Medications on File Prior to Visit  Medication Dose Route Frequency Provider Last Rate Last Admin  . Benralizumab SOSY 30 mg  30 mg Subcutaneous Q8 Weeks Kennith Gain, MD   30 mg at 05/03/20 0947        ROS:  All others reviewed and negative.  Objective        PE:  BP 124/80   Pulse 87   Temp 97.9 F (36.6 C) (Oral)   Ht 5\' 5"  (1.651 m)   Wt 167 lb (75.8 kg)   SpO2 95%   BMI 27.79 kg/m                 Constitutional: Pt appears in NAD               HENT: Head: NCAT.                Right Ear: External ear normal.                 Left Ear: External ear normal.                Eyes: . Pupils are equal, round, and reactive to light. Conjunctivae and EOM are normal               Nose: without d/c or deformity               Neck:  Neck supple. Gross normal ROM               Cardiovascular: Normal rate and regular rhythm.                 Pulmonary/Chest: Effort normal and breath sounds without rales or wheezing.                Abd:  Soft, NT, ND, + BS, no organomegaly               Neurological: Pt is alert. At baseline orientation, motor grossly intact               Skin: Skin is warm. No rashes, no other new lesions, LE edema -  none               Psychiatric: Pt behavior is normal without agitation   Micro: none  Cardiac tracings I have personally interpreted today:  none  Pertinent Radiological findings (summarize): none   Lab Results  Component Value Date   WBC 9.2 06/22/2020   HGB 12.3 06/22/2020   HCT 38.1 06/22/2020   PLT 271 06/22/2020   GLUCOSE 121 (H) 06/22/2020   CHOL 208 (H) 05/03/2020   TRIG 99 05/30/2020   HDL 69.90 05/03/2020   LDLCALC 120 (H) 05/03/2020   ALT 18 06/22/2020   AST 21 06/22/2020   NA 141 06/22/2020   K 3.3 (L) 06/22/2020   CL 104 06/22/2020   CREATININE 0.87 06/22/2020   BUN 8 06/22/2020   CO2 26 06/22/2020   TSH 3.12 05/03/2020   HGBA1C 5.8 05/03/2020   Assessment/Plan:  LAVETTE YANKOVICH is a 80 y.o. Black or African American [2] female with  has a past medical history of Arthritis,  Asthma, Bowel obstruction ([place]), Environmental allergies, GERD (gastroesophageal reflux disease), Glaucoma (05/09/2017), H. pylori infection, HLD (hyperlipidemia) (01/16/2019), Hypertension, Iron deficiency anemia, Osteopenia (05/26/2017), Thyroid disease, and Urticaria (07/25/2018).  HTN (hypertension) BP Readings from Last 3 Encounters:  06/30/20 124/80  06/29/20 (!) 150/82  06/22/20 (!) 144/79   Stable, pt to continue medical treatment norvasc, losartan; to check BP at home daily for 10 days and call with average    HLD (hyperlipidemia) Lab Results  Component Value Date   LDLCALC 120 (H) 05/03/2020   Stable but elevated, pt to continue current statin pravachol 80 for now, declines  change for now, to work on low chol diet   Hyperglycemia Lab Results  Component Value Date   HGBA1C 5.8 05/03/2020   Stable, pt to continue current medical treatment  - diet   Followup: Return in about 6 months (around 12/28/2020).  Cathlean Cower, MD 07/03/2020 8:26 PM [facility]

## 2020-06-30 NOTE — Patient Instructions (Signed)
Ok to continue the current medication for now  Please check the BP twice per day for 3 wks, and call with the average (or mychart)  Please continue all other medications as before, and refills have been done if requested.  Please have the pharmacy call with any other refills you may need.  Please continue your efforts at being more active, low cholesterol diet, and weight control.  Please keep your appointments with your specialists as you may have planned

## 2020-06-30 NOTE — Telephone Encounter (Signed)
Patients daughter calling back, she is just concerned because the patient is on two BP medications, she is wondering if this is something she needs to be seen in the office for or if we can decide if we keep both appointments.  I made an appointment for her with [facility] tomorrow just in case.

## 2020-07-01 ENCOUNTER — Ambulatory Visit: Payer: Medicare HMO | Admitting: Internal Medicine

## 2020-07-01 ENCOUNTER — Other Ambulatory Visit: Payer: Self-pay

## 2020-07-01 ENCOUNTER — Encounter: Payer: Self-pay | Admitting: Internal Medicine

## 2020-07-01 ENCOUNTER — Inpatient Hospital Stay: Payer: Medicare HMO

## 2020-07-01 ENCOUNTER — Telehealth: Payer: Self-pay | Admitting: *Deleted

## 2020-07-01 NOTE — Progress Notes (Signed)
Met with patient and daughter by elevators to introduce myself as Arboriculturist and to offer available resources.  Discussed one-time $1000 Radio broadcast assistant to assist with personal expenses while going through treatment. Advised what is needed to apply. I will meet on Mon 2/14 to complete grant paperwork.  They have my card for any additional financial questions or concerns.

## 2020-07-01 NOTE — Telephone Encounter (Signed)
I received a message from Dr. [name] that it is ok for patient to continue her Berna Bue Pen injections while on her oncology treatment.  I called and spoke to the daughter. She was thankful for the update.

## 2020-07-01 NOTE — Telephone Encounter (Signed)
FYI: Patient's daughter called back, the oncologist said it would be fine to continue [place]. Patient is back on the schedule for 2/15 at 11:30am.

## 2020-07-03 ENCOUNTER — Encounter: Payer: Self-pay | Admitting: Internal Medicine

## 2020-07-03 NOTE — Assessment & Plan Note (Signed)
Lab Results  Component Value Date   HGBA1C 5.8 05/03/2020   Stable, pt to continue current medical treatment  - diet

## 2020-07-03 NOTE — Assessment & Plan Note (Signed)
Lab Results  Component Value Date   LDLCALC 120 (H) 05/03/2020   Stable but elevated, pt to continue current statin pravachol 80 for now, declines change for now, to work on low chol diet

## 2020-07-03 NOTE — Assessment & Plan Note (Signed)
BP Readings from Last 3 Encounters:  06/30/20 124/80  06/29/20 (!) 150/82  06/22/20 (!) 144/79   Stable, pt to continue medical treatment norvasc, losartan; to check BP at home daily for 10 days and call with average

## 2020-07-04 ENCOUNTER — Encounter: Payer: Self-pay | Admitting: Emergency Medicine

## 2020-07-04 ENCOUNTER — Inpatient Hospital Stay: Payer: Medicare HMO

## 2020-07-04 ENCOUNTER — Encounter: Payer: Self-pay | Admitting: Internal Medicine

## 2020-07-04 ENCOUNTER — Other Ambulatory Visit: Payer: Self-pay

## 2020-07-04 VITALS — BP 145/81 | HR 73 | Temp 98.0°F | Resp 18 | Ht 65.0 in | Wt 166.5 lb

## 2020-07-04 DIAGNOSIS — C3492 Malignant neoplasm of unspecified part of left bronchus or lung: Secondary | ICD-10-CM

## 2020-07-04 DIAGNOSIS — Z79899 Other long term (current) drug therapy: Secondary | ICD-10-CM | POA: Diagnosis not present

## 2020-07-04 DIAGNOSIS — Z5111 Encounter for antineoplastic chemotherapy: Secondary | ICD-10-CM | POA: Diagnosis not present

## 2020-07-04 DIAGNOSIS — R634 Abnormal weight loss: Secondary | ICD-10-CM | POA: Diagnosis not present

## 2020-07-04 DIAGNOSIS — R5383 Other fatigue: Secondary | ICD-10-CM | POA: Diagnosis not present

## 2020-07-04 DIAGNOSIS — C3432 Malignant neoplasm of lower lobe, left bronchus or lung: Secondary | ICD-10-CM | POA: Diagnosis not present

## 2020-07-04 DIAGNOSIS — Z87891 Personal history of nicotine dependence: Secondary | ICD-10-CM | POA: Diagnosis not present

## 2020-07-04 DIAGNOSIS — K219 Gastro-esophageal reflux disease without esophagitis: Secondary | ICD-10-CM | POA: Diagnosis not present

## 2020-07-04 DIAGNOSIS — D509 Iron deficiency anemia, unspecified: Secondary | ICD-10-CM | POA: Diagnosis not present

## 2020-07-04 DIAGNOSIS — Z006 Encounter for examination for normal comparison and control in clinical research program: Secondary | ICD-10-CM

## 2020-07-04 DIAGNOSIS — M1990 Unspecified osteoarthritis, unspecified site: Secondary | ICD-10-CM | POA: Diagnosis not present

## 2020-07-04 LAB — CBC WITH DIFFERENTIAL (CANCER CENTER ONLY)
Abs Immature Granulocytes: 0.01 10*3/uL (ref 0.00–0.07)
Basophils Absolute: 0 10*3/uL (ref 0.0–0.1)
Basophils Relative: 1 %
Eosinophils Absolute: 0 10*3/uL (ref 0.0–0.5)
Eosinophils Relative: 0 %
HCT: 40.4 % (ref 36.0–46.0)
Hemoglobin: 13.3 g/dL (ref 12.0–15.0)
Immature Granulocytes: 0 %
Lymphocytes Relative: 29 %
Lymphs Abs: 2.5 10*3/uL (ref 0.7–4.0)
MCH: 29.8 pg (ref 26.0–34.0)
MCHC: 32.9 g/dL (ref 30.0–36.0)
MCV: 90.4 fL (ref 80.0–100.0)
Monocytes Absolute: 0.7 10*3/uL (ref 0.1–1.0)
Monocytes Relative: 8 %
Neutro Abs: 5.4 10*3/uL (ref 1.7–7.7)
Neutrophils Relative %: 62 %
Platelet Count: 267 10*3/uL (ref 150–400)
RBC: 4.47 MIL/uL (ref 3.87–5.11)
RDW: 13.3 % (ref 11.5–15.5)
WBC Count: 8.6 10*3/uL (ref 4.0–10.5)
nRBC: 0 % (ref 0.0–0.2)

## 2020-07-04 LAB — CMP (CANCER CENTER ONLY)
ALT: 13 U/L (ref 0–44)
AST: 20 U/L (ref 15–41)
Albumin: 4.1 g/dL (ref 3.5–5.0)
Alkaline Phosphatase: 84 U/L (ref 38–126)
Anion gap: 12 (ref 5–15)
BUN: 7 mg/dL — ABNORMAL LOW (ref 8–23)
CO2: 23 mmol/L (ref 22–32)
Calcium: 9.8 mg/dL (ref 8.9–10.3)
Chloride: 105 mmol/L (ref 98–111)
Creatinine: 0.86 mg/dL (ref 0.44–1.00)
GFR, Estimated: >60 mL/min (ref >60)
Glucose, Bld: 93 mg/dL (ref 70–99)
Potassium: 3.4 mmol/L — ABNORMAL LOW (ref 3.5–5.1)
Sodium: 140 mmol/L (ref 135–145)
Total Bilirubin: 0.6 mg/dL (ref 0.3–1.2)
Total Protein: 7.9 g/dL (ref 6.5–8.1)

## 2020-07-04 LAB — RESEARCH LABS

## 2020-07-04 MED ORDER — SODIUM CHLORIDE 0.9 % IV SOLN
Freq: Once | INTRAVENOUS | Status: AC
  Filled 2020-07-04: qty 250

## 2020-07-04 MED ORDER — FAMOTIDINE IN NACL 20-0.9 MG/50ML-% IV SOLN
20.0000 mg | Freq: Once | INTRAVENOUS | Status: AC
Start: 2020-07-04 — End: 2020-07-04
  Administered 2020-07-04: 20 mg via INTRAVENOUS

## 2020-07-04 MED ORDER — PALONOSETRON HCL INJECTION 0.25 MG/5ML
INTRAVENOUS | Status: AC
  Filled 2020-07-04: qty 5

## 2020-07-04 MED ORDER — PALONOSETRON HCL INJECTION 0.25 MG/5ML
0.2500 mg | Freq: Once | INTRAVENOUS | Status: AC
  Administered 2020-07-04: 0.25 mg via INTRAVENOUS

## 2020-07-04 MED ORDER — DIPHENHYDRAMINE HCL 50 MG/ML IJ SOLN
INTRAMUSCULAR | Status: AC
  Filled 2020-07-04: qty 1

## 2020-07-04 MED ORDER — FAMOTIDINE IN NACL 20-0.9 MG/50ML-% IV SOLN
INTRAVENOUS | Status: AC
  Filled 2020-07-04: qty 50

## 2020-07-04 MED ORDER — SODIUM CHLORIDE 0.9 % IV SOLN
20.0000 mg | Freq: Once | INTRAVENOUS | Status: AC
  Administered 2020-07-04: 20 mg via INTRAVENOUS
  Filled 2020-07-04: qty 20

## 2020-07-04 MED ORDER — SODIUM CHLORIDE 0.9 % IV SOLN
158.0000 mg | Freq: Once | INTRAVENOUS | Status: AC
  Administered 2020-07-04: 160 mg via INTRAVENOUS
  Filled 2020-07-04: qty 16

## 2020-07-04 MED ORDER — SODIUM CHLORIDE 0.9 % IV SOLN
45.0000 mg/m2 | Freq: Once | INTRAVENOUS | Status: AC
  Administered 2020-07-04: 84 mg via INTRAVENOUS
  Filled 2020-07-04: qty 14

## 2020-07-04 MED ORDER — DIPHENHYDRAMINE HCL 50 MG/ML IJ SOLN
50.0000 mg | Freq: Once | INTRAMUSCULAR | Status: AC
  Administered 2020-07-04: 50 mg via INTRAVENOUS

## 2020-07-04 NOTE — Patient Instructions (Addendum)
[place] Discharge Instructions for Patients Receiving Chemotherapy  Today you received the following chemotherapy agents: Paclitaxel (Taxol) and Carboplatin  To help prevent nausea and vomiting after your treatment, we encourage you to take your nausea medication  as prescribed.    If you develop nausea and vomiting that is not controlled by your nausea medication, call the clinic.   BELOW ARE SYMPTOMS THAT SHOULD BE REPORTED IMMEDIATELY:  *FEVER GREATER THAN 100.5 F  *CHILLS WITH OR WITHOUT FEVER  NAUSEA AND VOMITING THAT IS NOT CONTROLLED WITH YOUR NAUSEA MEDICATION  *UNUSUAL SHORTNESS OF BREATH  *UNUSUAL BRUISING OR BLEEDING  TENDERNESS IN MOUTH AND THROAT WITH OR WITHOUT PRESENCE OF ULCERS  *URINARY PROBLEMS  *BOWEL PROBLEMS  UNUSUAL RASH Items with * indicate a potential emergency and should be followed up as soon as possible.  Feel free to call the clinic should you have any questions or concerns. The clinic phone number is [phone].  Please show the [facility] at check-in to the Emergency Department and triage nurse.  Paclitaxel injection What is this medicine? PACLITAXEL (PAK li TAX el) is a chemotherapy drug. It targets fast dividing cells, like cancer cells, and causes these cells to die. This medicine is used to treat ovarian cancer, breast cancer, lung cancer, Kaposi's sarcoma, and other cancers. This medicine may be used for other purposes; ask your health care provider or pharmacist if you have questions. COMMON BRAND NAME(S): Onxol, Taxol What should I tell my health care provider before I take this medicine? They need to know if you have any of these conditions:  history of irregular heartbeat  liver disease  low blood counts, like low white cell, platelet, or red cell counts  lung or breathing disease, like asthma  tingling of the fingers or toes, or other nerve disorder  an unusual or allergic reaction to paclitaxel,  alcohol, polyoxyethylated castor oil, other chemotherapy, other medicines, foods, dyes, or preservatives  pregnant or trying to get pregnant  breast-feeding How should I use this medicine? This drug is given as an infusion into a vein. It is administered in a hospital or clinic by a specially trained health care professional. Talk to your pediatrician regarding the use of this medicine in children. Special care may be needed. Overdosage: If you think you have taken too much of this medicine contact a poison control center or emergency room at once. NOTE: This medicine is only for you. Do not share this medicine with others. What if I miss a dose? It is important not to miss your dose. Call your doctor or health care professional if you are unable to keep an appointment. What may interact with this medicine? Do not take this medicine with any of the following medications:  live virus vaccines This medicine may also interact with the following medications:  antiviral medicines for hepatitis, HIV or AIDS  certain antibiotics like erythromycin and clarithromycin  certain medicines for fungal infections like ketoconazole and itraconazole  certain medicines for seizures like carbamazepine, phenobarbital, phenytoin  gemfibrozil  nefazodone  rifampin  St. John's wort This list may not describe all possible interactions. Give your health care provider a list of all the medicines, herbs, non-prescription drugs, or dietary supplements you use. Also tell them if you smoke, drink alcohol, or use illegal drugs. Some items may interact with your medicine. What should I watch for while using this medicine? Your condition will be monitored carefully while you are receiving this medicine. You  will need important blood work done while you are taking this medicine. This medicine can cause serious allergic reactions. To reduce your risk you will need to take other medicine(s) before treatment with  this medicine. If you experience allergic reactions like skin rash, itching or hives, swelling of the face, lips, or tongue, tell your doctor or health care professional right away. In some cases, you may be given additional medicines to help with side effects. Follow all directions for their use. This drug may make you feel generally unwell. This is not uncommon, as chemotherapy can affect healthy cells as well as cancer cells. Report any side effects. Continue your course of treatment even though you feel ill unless your doctor tells you to stop. Call your doctor or health care professional for advice if you get a fever, chills or sore throat, or other symptoms of a cold or flu. Do not treat yourself. This drug decreases your body's ability to fight infections. Try to avoid being around people who are sick. This medicine may increase your risk to bruise or bleed. Call your doctor or health care professional if you notice any unusual bleeding. Be careful brushing and flossing your teeth or using a toothpick because you may get an infection or bleed more easily. If you have any dental work done, tell your dentist you are receiving this medicine. Avoid taking products that contain aspirin, acetaminophen, ibuprofen, naproxen, or ketoprofen unless instructed by your doctor. These medicines may hide a fever. Do not become pregnant while taking this medicine. Women should inform their doctor if they wish to become pregnant or think they might be pregnant. There is a potential for serious side effects to an unborn child. Talk to your health care professional or pharmacist for more information. Do not breast-feed an infant while taking this medicine. Men are advised not to father a child while receiving this medicine. This product may contain alcohol. Ask your pharmacist or healthcare provider if this medicine contains alcohol. Be sure to tell all healthcare providers you are taking this medicine. Certain  medicines, like metronidazole and disulfiram, can cause an unpleasant reaction when taken with alcohol. The reaction includes flushing, headache, nausea, vomiting, sweating, and increased thirst. The reaction can last from 30 minutes to several hours. What side effects may I notice from receiving this medicine? Side effects that you should report to your doctor or health care professional as soon as possible:  allergic reactions like skin rash, itching or hives, swelling of the face, lips, or tongue  breathing problems  changes in vision  fast, irregular heartbeat  high or low blood pressure  mouth sores  pain, tingling, numbness in the hands or feet  signs of decreased platelets or bleeding - bruising, pinpoint red spots on the skin, black, tarry stools, blood in the urine  signs of decreased red blood cells - unusually weak or tired, feeling faint or lightheaded, falls  signs of infection - fever or chills, cough, sore throat, pain or difficulty passing urine  signs and symptoms of liver injury like dark yellow or brown urine; general ill feeling or flu-like symptoms; light-colored stools; loss of appetite; nausea; right upper belly pain; unusually weak or tired; yellowing of the eyes or skin  swelling of the ankles, feet, hands  unusually slow heartbeat Side effects that usually do not require medical attention (report to your doctor or health care professional if they continue or are bothersome):  diarrhea  hair loss  loss of appetite  muscle or joint pain  nausea, vomiting  pain, redness, or irritation at site where injected  tiredness This list may not describe all possible side effects. Call your doctor for medical advice about side effects. You may report side effects to FDA at 1-800-FDA-1088. Where should I keep my medicine? This drug is given in a hospital or clinic and will not be stored at home. NOTE: This sheet is a summary. It may not cover all possible  information. If you have questions about this medicine, talk to your doctor, pharmacist, or health care provider.  2021 Elsevier/Gold Standard (2019-04-08 13:37:23)  Carboplatin injection What is this medicine? CARBOPLATIN (KAR boe pla tin) is a chemotherapy drug. It targets fast dividing cells, like cancer cells, and causes these cells to die. This medicine is used to treat ovarian cancer and many other cancers. This medicine may be used for other purposes; ask your health care provider or pharmacist if you have questions. COMMON BRAND NAME(S): Paraplatin What should I tell my health care provider before I take this medicine? They need to know if you have any of these conditions:  blood disorders  hearing problems  kidney disease  recent or ongoing radiation therapy  an unusual or allergic reaction to carboplatin, cisplatin, other chemotherapy, other medicines, foods, dyes, or preservatives  pregnant or trying to get pregnant  breast-feeding How should I use this medicine? This drug is usually given as an infusion into a vein. It is administered in a hospital or clinic by a specially trained health care professional. Talk to your pediatrician regarding the use of this medicine in children. Special care may be needed. Overdosage: If you think you have taken too much of this medicine contact a poison control center or emergency room at once. NOTE: This medicine is only for you. Do not share this medicine with others. What if I miss a dose? It is important not to miss a dose. Call your doctor or health care professional if you are unable to keep an appointment. What may interact with this medicine?  medicines for seizures  medicines to increase blood counts like filgrastim, pegfilgrastim, sargramostim  some antibiotics like amikacin, gentamicin, neomycin, streptomycin, tobramycin  vaccines Talk to your doctor or health care professional before taking any of these  medicines:  acetaminophen  aspirin  ibuprofen  ketoprofen  naproxen This list may not describe all possible interactions. Give your health care provider a list of all the medicines, herbs, non-prescription drugs, or dietary supplements you use. Also tell them if you smoke, drink alcohol, or use illegal drugs. Some items may interact with your medicine. What should I watch for while using this medicine? Your condition will be monitored carefully while you are receiving this medicine. You will need important blood work done while you are taking this medicine. This drug may make you feel generally unwell. This is not uncommon, as chemotherapy can affect healthy cells as well as cancer cells. Report any side effects. Continue your course of treatment even though you feel ill unless your doctor tells you to stop. In some cases, you may be given additional medicines to help with side effects. Follow all directions for their use. Call your doctor or health care professional for advice if you get a fever, chills or sore throat, or other symptoms of a cold or flu. Do not treat yourself. This drug decreases your body's ability to fight infections. Try to avoid being around people who are sick. This medicine may increase your  risk to bruise or bleed. Call your doctor or health care professional if you notice any unusual bleeding. Be careful brushing and flossing your teeth or using a toothpick because you may get an infection or bleed more easily. If you have any dental work done, tell your dentist you are receiving this medicine. Avoid taking products that contain aspirin, acetaminophen, ibuprofen, naproxen, or ketoprofen unless instructed by your doctor. These medicines may hide a fever. Do not become pregnant while taking this medicine. Women should inform their doctor if they wish to become pregnant or think they might be pregnant. There is a potential for serious side effects to an unborn child. Talk  to your health care professional or pharmacist for more information. Do not breast-feed an infant while taking this medicine. What side effects may I notice from receiving this medicine? Side effects that you should report to your doctor or health care professional as soon as possible:  allergic reactions like skin rash, itching or hives, swelling of the face, lips, or tongue  signs of infection - fever or chills, cough, sore throat, pain or difficulty passing urine  signs of decreased platelets or bleeding - bruising, pinpoint red spots on the skin, black, tarry stools, nosebleeds  signs of decreased red blood cells - unusually weak or tired, fainting spells, lightheadedness  breathing problems  changes in hearing  changes in vision  chest pain  high blood pressure  low blood counts - This drug may decrease the number of white blood cells, red blood cells and platelets. You may be at increased risk for infections and bleeding.  nausea and vomiting  pain, swelling, redness or irritation at the injection site  pain, tingling, numbness in the hands or feet  problems with balance, talking, walking  trouble passing urine or change in the amount of urine Side effects that usually do not require medical attention (report to your doctor or health care professional if they continue or are bothersome):  hair loss  loss of appetite  metallic taste in the mouth or changes in taste This list may not describe all possible side effects. Call your doctor for medical advice about side effects. You may report side effects to FDA at 1-800-FDA-1088. Where should I keep my medicine? This drug is given in a hospital or clinic and will not be stored at home. NOTE: This sheet is a summary. It may not cover all possible information. If you have questions about this medicine, talk to your doctor, pharmacist, or health care provider.  2021 Elsevier/Gold Standard (2007-08-12 14:38:05)

## 2020-07-04 NOTE — Progress Notes (Signed)
Met with patient to obtain signature for [employer] grant this morning. Patient approved. She asked me to go over information with her daughter when she came to pick her up.  Went over Research scientist (life sciences)). Explained in detail expenses and how they are covered. Gave her a copy of the approval letter and expense sheet along with the Outpatient pharmacy information. She received a gift card today from grant.  She has my card for any additional financial questions or concerns.

## 2020-07-04 NOTE — Research (Signed)
[name] - Customer service manager for the Discovery and Validation of Biomarkers for the Prediction, Diagnosis, and Management of Disease  07/04/20  Labs:  Patient presented to the clinic for her lab and infusion appointments.  Research labs were collected by phlebotomist Mardene Speak at [phone] via venipuncture.    Gift Card: Patient was given a $50 visa gift card for participation in the study after labs were drawn.    The patient's daughter, [name], called to check on the research study.  I informed her that the research labs had been drawn and her mother was given the gift card.  The patient was thanked for her time and participation.  Clabe Seal Clinical Research Coordinator I  07/04/20 9:19 AM

## 2020-07-05 ENCOUNTER — Other Ambulatory Visit: Payer: Self-pay

## 2020-07-05 ENCOUNTER — Telehealth: Payer: Self-pay | Admitting: *Deleted

## 2020-07-05 ENCOUNTER — Ambulatory Visit
Admission: RE | Admit: 2020-07-05 | Discharge: 2020-07-05 | Disposition: A | Discharge status: Home / Self Care | Payer: Medicare HMO | Source: Ambulatory Visit | Attending: Radiation Oncology | Admitting: Radiation Oncology

## 2020-07-05 ENCOUNTER — Ambulatory Visit (INDEPENDENT_AMBULATORY_CARE_PROVIDER_SITE_OTHER): Payer: Medicare HMO

## 2020-07-05 DIAGNOSIS — Z510 Encounter for antineoplastic radiation therapy: Secondary | ICD-10-CM | POA: Diagnosis not present

## 2020-07-05 DIAGNOSIS — J4550 Severe persistent asthma, uncomplicated: Secondary | ICD-10-CM | POA: Diagnosis not present

## 2020-07-05 DIAGNOSIS — Z87891 Personal history of nicotine dependence: Secondary | ICD-10-CM | POA: Diagnosis not present

## 2020-07-05 DIAGNOSIS — C3432 Malignant neoplasm of lower lobe, left bronchus or lung: Secondary | ICD-10-CM | POA: Diagnosis not present

## 2020-07-05 DIAGNOSIS — C3492 Malignant neoplasm of unspecified part of left bronchus or lung: Secondary | ICD-10-CM

## 2020-07-05 NOTE — Telephone Encounter (Signed)
-----   Message from Jolaine Click, RN sent at 07/04/2020  2:40 PM EST ----- Regarding: First Time Paclitaxel and Carboplatin - Dr. [name] Patient First Time Paclitaxel and Carboplatin - Dr. [name] Patient Please include her daughter, [name], in conversation.

## 2020-07-05 NOTE — Telephone Encounter (Signed)
Called & spoke with pt's daughter, Angelica [name] who states that mother did well yest. & no c/o's of nausea or bowel problems. She states she ate well & slept well.  Acknowledged that she knew how to reach [place] for any concerns.

## 2020-07-06 ENCOUNTER — Other Ambulatory Visit: Payer: Self-pay

## 2020-07-06 ENCOUNTER — Ambulatory Visit
Admission: RE | Admit: 2020-07-06 | Discharge: 2020-07-06 | Disposition: A | Discharge status: Home / Self Care | Payer: Medicare HMO | Source: Ambulatory Visit | Attending: Radiation Oncology | Admitting: Radiation Oncology

## 2020-07-06 DIAGNOSIS — Z87891 Personal history of nicotine dependence: Secondary | ICD-10-CM | POA: Diagnosis not present

## 2020-07-06 DIAGNOSIS — Z510 Encounter for antineoplastic radiation therapy: Secondary | ICD-10-CM | POA: Diagnosis not present

## 2020-07-06 DIAGNOSIS — C3432 Malignant neoplasm of lower lobe, left bronchus or lung: Secondary | ICD-10-CM | POA: Diagnosis not present

## 2020-07-06 DIAGNOSIS — C3491 Malignant neoplasm of unspecified part of right bronchus or lung: Secondary | ICD-10-CM | POA: Diagnosis not present

## 2020-07-07 ENCOUNTER — Other Ambulatory Visit: Payer: Self-pay

## 2020-07-07 ENCOUNTER — Ambulatory Visit
Admission: RE | Admit: 2020-07-07 | Discharge: 2020-07-07 | Disposition: A | Discharge status: Home / Self Care | Payer: Medicare HMO | Source: Ambulatory Visit | Attending: Radiation Oncology | Admitting: Radiation Oncology

## 2020-07-07 DIAGNOSIS — Z87891 Personal history of nicotine dependence: Secondary | ICD-10-CM | POA: Diagnosis not present

## 2020-07-07 DIAGNOSIS — C3432 Malignant neoplasm of lower lobe, left bronchus or lung: Secondary | ICD-10-CM | POA: Diagnosis not present

## 2020-07-07 DIAGNOSIS — Z510 Encounter for antineoplastic radiation therapy: Secondary | ICD-10-CM | POA: Diagnosis not present

## 2020-07-08 ENCOUNTER — Encounter: Payer: Self-pay | Admitting: Internal Medicine

## 2020-07-08 ENCOUNTER — Ambulatory Visit
Admission: RE | Admit: 2020-07-08 | Discharge: 2020-07-08 | Disposition: A | Discharge status: Home / Self Care | Payer: Medicare HMO | Source: Ambulatory Visit | Attending: Radiation Oncology | Admitting: Radiation Oncology

## 2020-07-08 ENCOUNTER — Other Ambulatory Visit: Payer: Self-pay

## 2020-07-08 DIAGNOSIS — U071 COVID-19: Secondary | ICD-10-CM | POA: Diagnosis not present

## 2020-07-08 DIAGNOSIS — R918 Other nonspecific abnormal finding of lung field: Secondary | ICD-10-CM | POA: Diagnosis not present

## 2020-07-08 DIAGNOSIS — C3432 Malignant neoplasm of lower lobe, left bronchus or lung: Secondary | ICD-10-CM | POA: Diagnosis not present

## 2020-07-08 DIAGNOSIS — K565 Intestinal adhesions [bands], unspecified as to partial versus complete obstruction: Secondary | ICD-10-CM | POA: Diagnosis not present

## 2020-07-08 DIAGNOSIS — I10 Essential (primary) hypertension: Secondary | ICD-10-CM | POA: Diagnosis not present

## 2020-07-08 DIAGNOSIS — Z87891 Personal history of nicotine dependence: Secondary | ICD-10-CM | POA: Diagnosis not present

## 2020-07-08 DIAGNOSIS — Z48815 Encounter for surgical aftercare following surgery on the digestive system: Secondary | ICD-10-CM | POA: Diagnosis not present

## 2020-07-08 DIAGNOSIS — M19041 Primary osteoarthritis, right hand: Secondary | ICD-10-CM | POA: Diagnosis not present

## 2020-07-08 DIAGNOSIS — J4550 Severe persistent asthma, uncomplicated: Secondary | ICD-10-CM | POA: Diagnosis not present

## 2020-07-08 DIAGNOSIS — E049 Nontoxic goiter, unspecified: Secondary | ICD-10-CM | POA: Diagnosis not present

## 2020-07-08 DIAGNOSIS — Z510 Encounter for antineoplastic radiation therapy: Secondary | ICD-10-CM | POA: Diagnosis not present

## 2020-07-08 DIAGNOSIS — M170 Bilateral primary osteoarthritis of knee: Secondary | ICD-10-CM | POA: Diagnosis not present

## 2020-07-11 ENCOUNTER — Other Ambulatory Visit: Payer: Self-pay

## 2020-07-11 ENCOUNTER — Ambulatory Visit
Admission: RE | Admit: 2020-07-11 | Discharge: 2020-07-11 | Disposition: A | Discharge status: Home / Self Care | Payer: Medicare HMO | Source: Ambulatory Visit | Attending: Radiation Oncology | Admitting: Radiation Oncology

## 2020-07-11 ENCOUNTER — Encounter: Payer: Self-pay | Admitting: Internal Medicine

## 2020-07-11 ENCOUNTER — Inpatient Hospital Stay: Payer: Medicare HMO | Admitting: Internal Medicine

## 2020-07-11 ENCOUNTER — Other Ambulatory Visit: Payer: Self-pay | Admitting: Medical Oncology

## 2020-07-11 ENCOUNTER — Inpatient Hospital Stay: Payer: Medicare HMO

## 2020-07-11 ENCOUNTER — Telehealth: Payer: Self-pay | Admitting: Internal Medicine

## 2020-07-11 VITALS — BP 161/79 | HR 82 | Temp 97.2°F | Resp 14 | Ht 65.0 in | Wt 163.8 lb

## 2020-07-11 DIAGNOSIS — C3492 Malignant neoplasm of unspecified part of left bronchus or lung: Secondary | ICD-10-CM | POA: Diagnosis not present

## 2020-07-11 DIAGNOSIS — D509 Iron deficiency anemia, unspecified: Secondary | ICD-10-CM | POA: Diagnosis not present

## 2020-07-11 DIAGNOSIS — R634 Abnormal weight loss: Secondary | ICD-10-CM | POA: Diagnosis not present

## 2020-07-11 DIAGNOSIS — Z79899 Other long term (current) drug therapy: Secondary | ICD-10-CM | POA: Diagnosis not present

## 2020-07-11 DIAGNOSIS — Z87891 Personal history of nicotine dependence: Secondary | ICD-10-CM | POA: Diagnosis not present

## 2020-07-11 DIAGNOSIS — I878 Other specified disorders of veins: Secondary | ICD-10-CM

## 2020-07-11 DIAGNOSIS — C3432 Malignant neoplasm of lower lobe, left bronchus or lung: Secondary | ICD-10-CM | POA: Diagnosis not present

## 2020-07-11 DIAGNOSIS — K219 Gastro-esophageal reflux disease without esophagitis: Secondary | ICD-10-CM | POA: Diagnosis not present

## 2020-07-11 DIAGNOSIS — Z510 Encounter for antineoplastic radiation therapy: Secondary | ICD-10-CM | POA: Diagnosis not present

## 2020-07-11 DIAGNOSIS — R5383 Other fatigue: Secondary | ICD-10-CM | POA: Diagnosis not present

## 2020-07-11 DIAGNOSIS — Z5111 Encounter for antineoplastic chemotherapy: Secondary | ICD-10-CM | POA: Diagnosis not present

## 2020-07-11 DIAGNOSIS — M1990 Unspecified osteoarthritis, unspecified site: Secondary | ICD-10-CM | POA: Diagnosis not present

## 2020-07-11 LAB — CMP (CANCER CENTER ONLY)
ALT: 14 U/L (ref 0–44)
AST: 18 U/L (ref 15–41)
Albumin: 3.7 g/dL (ref 3.5–5.0)
Alkaline Phosphatase: 73 U/L (ref 38–126)
Anion gap: 10 (ref 5–15)
BUN: 9 mg/dL (ref 8–23)
CO2: 22 mmol/L (ref 22–32)
Calcium: 9.2 mg/dL (ref 8.9–10.3)
Chloride: 107 mmol/L (ref 98–111)
Creatinine: 0.8 mg/dL (ref 0.44–1.00)
GFR, Estimated: >60 mL/min (ref >60)
Glucose, Bld: 106 mg/dL — ABNORMAL HIGH (ref 70–99)
Potassium: 3.2 mmol/L — ABNORMAL LOW (ref 3.5–5.1)
Sodium: 139 mmol/L (ref 135–145)
Total Bilirubin: 0.6 mg/dL (ref 0.3–1.2)
Total Protein: 7.1 g/dL (ref 6.5–8.1)

## 2020-07-11 LAB — CBC WITH DIFFERENTIAL (CANCER CENTER ONLY)
Abs Immature Granulocytes: 0.07 10*3/uL (ref 0.00–0.07)
Basophils Absolute: 0 10*3/uL (ref 0.0–0.1)
Basophils Relative: 1 %
Eosinophils Absolute: 0 10*3/uL (ref 0.0–0.5)
Eosinophils Relative: 0 %
HCT: 38.9 % (ref 36.0–46.0)
Hemoglobin: 13 g/dL (ref 12.0–15.0)
Immature Granulocytes: 1 %
Lymphocytes Relative: 20 %
Lymphs Abs: 1.3 10*3/uL (ref 0.7–4.0)
MCH: 29.9 pg (ref 26.0–34.0)
MCHC: 33.4 g/dL (ref 30.0–36.0)
MCV: 89.4 fL (ref 80.0–100.0)
Monocytes Absolute: 0.4 10*3/uL (ref 0.1–1.0)
Monocytes Relative: 6 %
Neutro Abs: 4.7 10*3/uL (ref 1.7–7.7)
Neutrophils Relative %: 72 %
Platelet Count: 208 10*3/uL (ref 150–400)
RBC: 4.35 MIL/uL (ref 3.87–5.11)
RDW: 13.2 % (ref 11.5–15.5)
WBC Count: 6.5 10*3/uL (ref 4.0–10.5)
nRBC: 0 % (ref 0.0–0.2)

## 2020-07-11 MED ORDER — FAMOTIDINE IN NACL 20-0.9 MG/50ML-% IV SOLN
INTRAVENOUS | Status: AC
  Filled 2020-07-11: qty 50

## 2020-07-11 MED ORDER — SODIUM CHLORIDE 0.9 % IV SOLN
158.0000 mg | Freq: Once | INTRAVENOUS | Status: AC
Start: 2020-07-11 — End: 2020-07-11
  Administered 2020-07-11: 160 mg via INTRAVENOUS
  Filled 2020-07-11: qty 16

## 2020-07-11 MED ORDER — SODIUM CHLORIDE 0.9 % IV SOLN
Freq: Once | INTRAVENOUS | Status: AC
  Filled 2020-07-11: qty 250

## 2020-07-11 MED ORDER — DIPHENHYDRAMINE HCL 50 MG/ML IJ SOLN
50.0000 mg | Freq: Once | INTRAMUSCULAR | Status: AC
  Administered 2020-07-11: 50 mg via INTRAVENOUS

## 2020-07-11 MED ORDER — DIPHENHYDRAMINE HCL 50 MG/ML IJ SOLN
INTRAMUSCULAR | Status: AC
  Filled 2020-07-11: qty 1

## 2020-07-11 MED ORDER — SODIUM CHLORIDE 0.9 % IV SOLN
45.0000 mg/m2 | Freq: Once | INTRAVENOUS | Status: AC
  Administered 2020-07-11: 84 mg via INTRAVENOUS
  Filled 2020-07-11: qty 14

## 2020-07-11 MED ORDER — DEXAMETHASONE SODIUM PHOSPHATE 100 MG/10ML IJ SOLN
20.0000 mg | Freq: Once | INTRAMUSCULAR | Status: AC
  Administered 2020-07-11: 20 mg via INTRAVENOUS
  Filled 2020-07-11: qty 20

## 2020-07-11 MED ORDER — FAMOTIDINE IN NACL 20-0.9 MG/50ML-% IV SOLN
20.0000 mg | Freq: Once | INTRAVENOUS | Status: AC
  Administered 2020-07-11: 20 mg via INTRAVENOUS

## 2020-07-11 MED ORDER — PALONOSETRON HCL INJECTION 0.25 MG/5ML
0.2500 mg | Freq: Once | INTRAVENOUS | Status: AC
  Administered 2020-07-11: 0.25 mg via INTRAVENOUS

## 2020-07-11 MED ORDER — PALONOSETRON HCL INJECTION 0.25 MG/5ML
INTRAVENOUS | Status: AC
  Filled 2020-07-11: qty 5

## 2020-07-11 NOTE — Patient Instructions (Signed)
[facility] Discharge Instructions for Patients Receiving Chemotherapy  Today you received the following chemotherapy agents taxol; Carboplatin  To help prevent nausea and vomiting after your treatment, we encourage you to take your nausea medication as directed   If you develop nausea and vomiting that is not controlled by your nausea medication, call the clinic.   BELOW ARE SYMPTOMS THAT SHOULD BE REPORTED IMMEDIATELY:  *FEVER GREATER THAN 100.5 F  *CHILLS WITH OR WITHOUT FEVER  NAUSEA AND VOMITING THAT IS NOT CONTROLLED WITH YOUR NAUSEA MEDICATION  *UNUSUAL SHORTNESS OF BREATH  *UNUSUAL BRUISING OR BLEEDING  TENDERNESS IN MOUTH AND THROAT WITH OR WITHOUT PRESENCE OF ULCERS  *URINARY PROBLEMS  *BOWEL PROBLEMS  UNUSUAL RASH Items with * indicate a potential emergency and should be followed up as soon as possible.  Feel free to call the clinic should you have any questions or concerns. The clinic phone number is [phone].  Please show the [facility] at check-in to the Emergency Department and triage nurse.

## 2020-07-11 NOTE — Progress Notes (Signed)
[facility] [phone]) [phone]   [phone]  OFFICE PROGRESS NOTE  [name], MD [place] [id number]  DIAGNOSIS: Stage IIIB (T2 a, N3, M0) non-small cell lung cancer, adenocarcinoma presented with left lower lobe lung mass in addition to left hilar and mediastinal as well as right and left cervical lymphadenopathy diagnosed in January 2022  PRIOR THERAPY: None  CURRENT THERAPY: Concurrent chemoradiation with weekly carboplatin for AUC of 2 and paclitaxel 45 mg/M2.  Status post 1 cycle.  INTERVAL HISTORY: Angelica [name] 80 y.o. female returns to the clinic today for follow-up visit accompanied by her daughter.  The patient tolerated the first week of her treatment with carboplatin and paclitaxel fairly well.  She continues to have some confusion.  She denied having any nausea, vomiting, diarrhea or constipation.  She denied having any headache or visual changes.  She has no weight loss or night sweats.  She has no chest pain, shortness of breath, cough or hemoptysis.  She is here today for evaluation before starting cycle #2.  MEDICAL HISTORY: Past Medical History:  Diagnosis Date  . Arthritis   . Asthma   . Bowel obstruction ([place])   . Environmental allergies   . GERD (gastroesophageal reflux disease)   . Glaucoma 05/09/2017  . H. pylori infection   . HLD (hyperlipidemia) 01/16/2019  . Hypertension   . Iron deficiency anemia   . Osteopenia 05/26/2017  . Thyroid disease   . Urticaria 07/25/2018    ALLERGIES:  is allergic to asa buff (mag [buffered aspirin], fish allergy, milk-related compounds, other, peanut-containing drug products, penicillins, shellfish-derived products, and strawberry extract.  MEDICATIONS:  Current Outpatient Medications  Medication Sig Dispense Refill  . acetaminophen (TYLENOL) 325 MG tablet Take 2 tablets (650 mg total) by mouth every 6 (six) hours as needed for headache.    Marland Kitchen amLODipine (NORVASC)  10 MG tablet Take 1 tablet (10 mg total) by mouth daily. 90 tablet 0  . Benralizumab (FASENRA PEN) 30 MG/ML SOAJ Inject into the skin every 8 (eight) weeks. (Patient not taking: No sig reported)    . budesonide-formoterol (SYMBICORT) 80-4.5 MCG/ACT inhaler Inhale 2 puffs into the lungs 2 (two) times daily. Use for asthma flares 2 puffs twice a day for 2 weeks or until cough and wheeze free (Patient taking differently: Inhale 2 puffs into the lungs 2 (two) times daily as needed (for asthma flares, for 2 weeks, or until cough and wheeze-free).) 10.2 g 5  . Cholecalciferol (VITAMIN D3) 50 MCG (2000 UT) TABS Take 2,000 Units by mouth daily.    Marland Kitchen EPINEPHrine (EPIPEN 2-PAK) 0.3 mg/0.3 mL IJ SOAJ injection Use as directed for severe allergic reaction (Patient taking differently: Inject 0.3 mg into the muscle as needed for anaphylaxis.) 2 each 1  . ipratropium (ATROVENT) 0.06 % nasal spray Place 2 sprays into both nostrils 3 (three) times daily. (Patient taking differently: Place 1 spray into both nostrils 3 (three) times daily as needed (allergies.).) 15 mL 3  . linaclotide (LINZESS) 72 MCG capsule TAKE 1 CAPSULE BY MOUTH ONCE DAILY AS NEEDED 30 capsule 0  . loratadine (CLARITIN) 10 MG tablet Take 10 mg by mouth daily.    Marland Kitchen losartan (COZAAR) 100 MG tablet Take 1 tablet (100 mg total) by mouth daily. 90 tablet 3  . Magnesium Hydroxide (PHILLIPS MILK OF MAGNESIA PO) Take 15-30 mLs by mouth daily as needed (for constipation).    . polyethylene glycol  powder (GLYCOLAX/MIRALAX) powder Take 17 g by mouth daily as needed for mild constipation (MIX AND DRINK). (Patient not taking: Reported on 06/30/2020)    . pravastatin (PRAVACHOL) 80 MG tablet Take 1 tablet (80 mg total) by mouth daily. 90 tablet 3  . prochlorperazine (COMPAZINE) 10 MG tablet Take 1 tablet (10 mg total) by mouth every 6 (six) hours as needed for nausea or vomiting. 30 tablet 0  . traMADol (ULTRAM) 50 MG tablet Take 1 tablet (50 mg total) by mouth  every 6 (six) hours as needed for moderate pain. 15 tablet 0  . vitamin B-12 (CYANOCOBALAMIN) 1000 MCG tablet Take 1,000 mcg by mouth daily.     Current Facility-Administered Medications  Medication Dose Route Frequency Provider Last Rate Last Admin  . Benralizumab SOSY 30 mg  30 mg Subcutaneous Q8 Weeks Kennith Gain, MD   30 mg at 07/05/20 1126    SURGICAL HISTORY:  Past Surgical History:  Procedure Laterality Date  . ABDOMINAL HYSTERECTOMY    . BOWEL RESECTION N/A 05/23/2020   Procedure: SMALL BOWEL REPAIR;  Surgeon: Kinsinger, Arta Bruce, MD;  Location: [facility];  Service: General;  Laterality: N/A;  . BREAST BIOPSY    . BRONCHIAL BIOPSY  06/10/2020   Procedure: BRONCHIAL BIOPSIES;  Surgeon: Garner Nash, DO;  Location: [facility];  Service: Pulmonary;;  . BRONCHIAL BRUSHINGS  06/10/2020   Procedure: BRONCHIAL BRUSHINGS;  Surgeon: Garner Nash, DO;  Location: [facility];  Service: Pulmonary;;  . BRONCHIAL NEEDLE ASPIRATION BIOPSY  06/10/2020   Procedure: BRONCHIAL NEEDLE ASPIRATION BIOPSIES;  Surgeon: Garner Nash, DO;  Location: [facility];  Service: Pulmonary;;  . BRONCHIAL WASHINGS  06/10/2020   Procedure: BRONCHIAL WASHINGS;  Surgeon: Garner Nash, DO;  Location: [facility];  Service: Pulmonary;;  . COLON SURGERY    . DIAGNOSTIC LARYNGOSCOPY N/A 05/23/2020   Procedure: ATTEMPTED DIAGNOSTIC LAPAROSCOPY WITH ADHESIONS;  Surgeon: Kieth Brightly, Arta Bruce, MD;  Location: [facility];  Service: General;  Laterality: N/A;  . KNEE SURGERY    . LAPAROTOMY N/A 04/18/2015   Procedure: Exploratory laparotomy with lysis of adhesions, possible bowel resection;  Surgeon: Ralene Ok, MD;  Location: [facility];  Service: General;  Laterality: N/A;  . LAPAROTOMY N/A 05/23/2020   Procedure: EXPLORATORY LAPAROTOMY;  Surgeon: Kieth Brightly Arta Bruce, MD;  Location: [facility];  Service: General;  Laterality: N/A;  . LYSIS OF ADHESION N/A 05/23/2020   Procedure: LYSIS OF ADHESION;  Surgeon:  Kieth Brightly Arta Bruce, MD;  Location: [facility];  Service: General;  Laterality: N/A;  . NASAL SINUS SURGERY    . THYROID SURGERY    . VIDEO BRONCHOSCOPY WITH ENDOBRONCHIAL NAVIGATION Bilateral 06/10/2020   Procedure: VIDEO BRONCHOSCOPY WITH ENDOBRONCHIAL NAVIGATION;  Surgeon: Garner Nash, DO;  Location: [facility];  Service: Pulmonary;  Laterality: Bilateral;  . VIDEO BRONCHOSCOPY WITH ENDOBRONCHIAL ULTRASOUND  06/10/2020   Procedure: VIDEO BRONCHOSCOPY WITH ENDOBRONCHIAL ULTRASOUND;  Surgeon: Garner Nash, DO;  Location: [facility];  Service: Pulmonary;;    REVIEW OF SYSTEMS:  A comprehensive review of systems was negative except for: Constitutional: positive for fatigue Neurological: positive for memory problems   PHYSICAL EXAMINATION: General appearance: alert, cooperative and no distress Head: Normocephalic, without obvious abnormality, atraumatic Neck: no adenopathy, no JVD, supple, symmetrical, trachea midline and thyroid not enlarged, symmetric, no tenderness/mass/nodules Lymph nodes: Cervical, supraclavicular, and axillary nodes normal. Resp: clear to auscultation bilaterally Back: symmetric, no curvature. ROM normal. No CVA tenderness. Cardio: regular rate and rhythm, S1, S2  normal, no murmur, click, rub or gallop GI: soft, non-tender; bowel sounds normal; no masses,  no organomegaly Extremities: extremities normal, atraumatic, no cyanosis or edema  ECOG PERFORMANCE STATUS: 1 - Symptomatic but completely ambulatory  Blood pressure (!) 161/79, pulse 82, temperature (!) 97.2 F (36.2 C), resp. rate 14, height 5\' 5"  (1.651 m), weight 163 lb 12.8 oz (74.3 kg), SpO2 98 %.  LABORATORY DATA: Lab Results  Component Value Date   WBC 8.6 07/04/2020   HGB 13.3 07/04/2020   HCT 40.4 07/04/2020   MCV 90.4 07/04/2020   PLT 267 07/04/2020      Chemistry      Component Value Date/Time   NA 140 07/04/2020 0803   K 3.4 (L) 07/04/2020 0803   CL 105 07/04/2020 0803   CO2 23  07/04/2020 0803   BUN 7 (L) 07/04/2020 0803   CREATININE 0.86 07/04/2020 0803      Component Value Date/Time   CALCIUM 9.8 07/04/2020 0803   ALKPHOS 84 07/04/2020 0803   AST 20 07/04/2020 0803   ALT 13 07/04/2020 0803   BILITOT 0.6 07/04/2020 0803       RADIOGRAPHIC STUDIES: MR BRAIN W WO CONTRAST  Result Date: 06/15/2020 CLINICAL DATA:  80 year old female with non-small cell lung cancer diagnosed in the past two months. Staging. EXAM: MRI HEAD WITHOUT AND WITH CONTRAST TECHNIQUE: Multiplanar, multiecho pulse sequences of the brain and surrounding structures were obtained without and with intravenous contrast. CONTRAST:  40mL MULTIHANCE GADOBENATE DIMEGLUMINE 529 MG/ML IV SOLN COMPARISON:  Head CTs 01/21/2020 and earlier. FINDINGS: Brain: No abnormal enhancement identified. No dural thickening identified. No restricted diffusion to suggest acute infarction. No midline shift, mass effect, evidence of mass lesion, ventriculomegaly, extra-axial collection or acute intracranial hemorrhage. Cervicomedullary junction and pituitary are within normal limits. Mild for age mostly periventricular white matter T2 and FLAIR hyperintensity in a nonspecific configuration. No cortical encephalomalacia or chronic cerebral blood products identified. Deep gray matter nuclei, brainstem and cerebellum appear negative. Vascular: Major intracranial vascular flow voids are preserved. The major dural venous sinuses are enhancing and appear to be patent. Skull and upper cervical spine: Visible cervical spine is normal for age. Visualized bone marrow signal is within normal limits. Sinuses/Orbits: Postoperative changes to both globes. Widespread paranasal sinus mucosal thickening and opacification. Similar sinus disease on prior head CTs. No complicating features. Other: Mastoid air cells are well aerated, trace fluid on the right. Grossly normal visible internal auditory structures. Negative visible nasopharynx. Visible  scalp and face soft tissues appear negative. IMPRESSION: 1. No metastatic disease or acute intracranial abnormality. 2. Mild for age cerebral white matter signal changes most commonly due to chronic small vessel disease. 3. Chronic paranasal sinus disease. Electronically Signed   By: Genevie Ann M.D.   On: 06/15/2020 05:58   NM PET Image Initial (PI) Skull Base To Thigh  Result Date: 06/15/2020 CLINICAL DATA:  Initial treatment strategy for non-small cell lung carcinoma. EXAM: NUCLEAR MEDICINE PET SKULL BASE TO THIGH TECHNIQUE: 8.6 mCi F-18 FDG was injected intravenously. Full-ring PET imaging was performed from the skull base to thigh after the radiotracer. CT data was obtained and used for attenuation correction and anatomic localization. Fasting blood glucose: 91 mg/dl COMPARISON:  CT on 05/17/2020 FINDINGS: Mediastinal blood-pool activity (background): SUV max = 2.8 Liver activity (reference): SUV max = N/A NECK: A 6 mm right low jugular/level 4 lymph node is seen on image 38/4, which is hypermetabolic with SUV max of 4.9. a 5 mm  left level 1B submandibular lymph node is seen which also shows hypermetabolic activity, with SUV max of 3.4. Diffusely enlarged thyroid gland is seen with multiple bilateral hypermetabolic nodules. Incidental CT findings:  None. CHEST: Hypermetabolic mass is seen in the medial left lower lobe which measures 3.6 x 2.4 cm, and has SUV max of 13.7. A small hypermetabolic left hilar lymph node is seen with SUV max of 4.6. Hypermetabolic mediastinal lymph nodes are seen in the right paratracheal and subcarinal regions, with index lymph node measuring 1.7 cm on image 66/4 with SUV max of 11.2. Incidental CT findings:  None. ABDOMEN/PELVIS: No abnormal hypermetabolic activity within the liver, pancreas, adrenal glands, or spleen. No hypermetabolic lymph nodes in the abdomen or pelvis. Incidental CT findings: Prior hysterectomy noted. Aortic atherosclerotic calcification noted. SKELETON: No  focal hypermetabolic bone lesions to suggest skeletal metastasis. Incidental CT findings:  None. IMPRESSION: Hypermetabolic mass in the medial left lower lobe, consistent with primary bronchogenic carcinoma. Mild hypermetabolic left hilar and mediastinal lymphadenopathy, consistent with metastatic disease. Sub-cm hypermetabolic right level 4 and left level 1B cervical lymph nodes, suspicious for metastatic disease. No evidence of metastatic disease within the abdomen or pelvis. Enlarged heterogeneous thyroid gland with bilateral heterogeneous hypermetabolic activity. Recommend thyroid ultrasound. (ref: J Am Coll Radiol. 2015 Feb;12(2): 143-50). Electronically Signed   By: Marlaine Hind M.D.   On: 06/15/2020 10:50    ASSESSMENT AND PLAN: This is a very pleasant 81 years old African-American female recently diagnosed with a stage IIb (T2a, N3, M0) non-small cell lung cancer, adenocarcinoma presented with left lower lobe lung mass in addition to left hilar and mediastinal lymphadenopathy as well as right and left cervical lymphadenopathy diagnosed in January 2022. The patient is currently undergoing a course of concurrent chemoradiation with weekly carboplatin and paclitaxel status post 1 cycle.  She tolerated the first cycle of her treatment fairly well with no concerning adverse effects. I recommended for the patient to proceed with cycle #2 today as planned. The patient will come back for follow-up visit in 2 weeks for evaluation before starting cycle #4 of her treatment. She was advised to call immediately if she has any other concerning symptoms in the interval. The patient voices understanding of current disease status and treatment options and is in agreement with the current care plan.  All questions were answered. The patient knows to call the clinic with any problems, questions or concerns. We can certainly see the patient much sooner if necessary.  Disclaimer: This note was dictated with voice  recognition software. Similar sounding words can inadvertently be transcribed and may not be corrected upon review.

## 2020-07-11 NOTE — Telephone Encounter (Signed)
Scheduled appointments per 2/21 los. Spoke to patient's daughter who is aware of appointments dates and times. Gave patient's daughter calendar print out.

## 2020-07-12 ENCOUNTER — Encounter (HOSPITAL_COMMUNITY): Payer: Self-pay

## 2020-07-12 ENCOUNTER — Telehealth: Payer: Self-pay

## 2020-07-12 ENCOUNTER — Other Ambulatory Visit: Payer: Self-pay

## 2020-07-12 ENCOUNTER — Ambulatory Visit
Admission: RE | Admit: 2020-07-12 | Discharge: 2020-07-12 | Disposition: A | Discharge status: Home / Self Care | Payer: Medicare HMO | Source: Ambulatory Visit | Attending: Radiation Oncology | Admitting: Radiation Oncology

## 2020-07-12 DIAGNOSIS — K565 Intestinal adhesions [bands], unspecified as to partial versus complete obstruction: Secondary | ICD-10-CM | POA: Diagnosis not present

## 2020-07-12 DIAGNOSIS — M19041 Primary osteoarthritis, right hand: Secondary | ICD-10-CM | POA: Diagnosis not present

## 2020-07-12 DIAGNOSIS — E049 Nontoxic goiter, unspecified: Secondary | ICD-10-CM | POA: Diagnosis not present

## 2020-07-12 DIAGNOSIS — M170 Bilateral primary osteoarthritis of knee: Secondary | ICD-10-CM | POA: Diagnosis not present

## 2020-07-12 DIAGNOSIS — Z510 Encounter for antineoplastic radiation therapy: Secondary | ICD-10-CM | POA: Diagnosis not present

## 2020-07-12 DIAGNOSIS — I10 Essential (primary) hypertension: Secondary | ICD-10-CM | POA: Diagnosis not present

## 2020-07-12 DIAGNOSIS — R918 Other nonspecific abnormal finding of lung field: Secondary | ICD-10-CM | POA: Diagnosis not present

## 2020-07-12 DIAGNOSIS — Z87891 Personal history of nicotine dependence: Secondary | ICD-10-CM | POA: Diagnosis not present

## 2020-07-12 DIAGNOSIS — J4550 Severe persistent asthma, uncomplicated: Secondary | ICD-10-CM | POA: Diagnosis not present

## 2020-07-12 DIAGNOSIS — U071 COVID-19: Secondary | ICD-10-CM | POA: Diagnosis not present

## 2020-07-12 DIAGNOSIS — C3432 Malignant neoplasm of lower lobe, left bronchus or lung: Secondary | ICD-10-CM | POA: Diagnosis not present

## 2020-07-12 DIAGNOSIS — Z48815 Encounter for surgical aftercare following surgery on the digestive system: Secondary | ICD-10-CM | POA: Diagnosis not present

## 2020-07-12 MED ORDER — TELMISARTAN 40 MG PO TABS: 40.0000 mg | ORAL_TABLET | Freq: Every day | ORAL | 3 refills | Status: DC

## 2020-07-12 NOTE — Addendum Note (Signed)
Addended by: [name] on: 07/12/2020 10:06 PM   Modules accepted: Orders

## 2020-07-12 NOTE — Telephone Encounter (Signed)
Received faxed request from [facility] that Losartan is on an indefinite manufacture's backorder.  Please prescribe alternative.

## 2020-07-12 NOTE — Telephone Encounter (Signed)
Ok to let pt know -   Losartan changed to micardis 40 and send to [place] as losartan not available at this time or anytime soon

## 2020-07-13 ENCOUNTER — Other Ambulatory Visit: Payer: Self-pay

## 2020-07-13 ENCOUNTER — Telehealth: Payer: Self-pay | Admitting: Internal Medicine

## 2020-07-13 ENCOUNTER — Ambulatory Visit
Admission: RE | Admit: 2020-07-13 | Discharge: 2020-07-13 | Disposition: A | Discharge status: Home / Self Care | Payer: Medicare HMO | Source: Ambulatory Visit | Attending: Radiation Oncology | Admitting: Radiation Oncology

## 2020-07-13 DIAGNOSIS — J4550 Severe persistent asthma, uncomplicated: Secondary | ICD-10-CM | POA: Diagnosis not present

## 2020-07-13 DIAGNOSIS — Z87891 Personal history of nicotine dependence: Secondary | ICD-10-CM | POA: Diagnosis not present

## 2020-07-13 DIAGNOSIS — C3432 Malignant neoplasm of lower lobe, left bronchus or lung: Secondary | ICD-10-CM | POA: Diagnosis not present

## 2020-07-13 DIAGNOSIS — Z510 Encounter for antineoplastic radiation therapy: Secondary | ICD-10-CM | POA: Diagnosis not present

## 2020-07-13 DIAGNOSIS — M19041 Primary osteoarthritis, right hand: Secondary | ICD-10-CM | POA: Diagnosis not present

## 2020-07-13 DIAGNOSIS — Z48815 Encounter for surgical aftercare following surgery on the digestive system: Secondary | ICD-10-CM | POA: Diagnosis not present

## 2020-07-13 DIAGNOSIS — R918 Other nonspecific abnormal finding of lung field: Secondary | ICD-10-CM | POA: Diagnosis not present

## 2020-07-13 DIAGNOSIS — E049 Nontoxic goiter, unspecified: Secondary | ICD-10-CM | POA: Diagnosis not present

## 2020-07-13 DIAGNOSIS — M170 Bilateral primary osteoarthritis of knee: Secondary | ICD-10-CM | POA: Diagnosis not present

## 2020-07-13 DIAGNOSIS — K565 Intestinal adhesions [bands], unspecified as to partial versus complete obstruction: Secondary | ICD-10-CM | POA: Diagnosis not present

## 2020-07-13 DIAGNOSIS — I10 Essential (primary) hypertension: Secondary | ICD-10-CM | POA: Diagnosis not present

## 2020-07-13 DIAGNOSIS — U071 COVID-19: Secondary | ICD-10-CM | POA: Diagnosis not present

## 2020-07-13 NOTE — Telephone Encounter (Signed)
Patient Report  Herbert Deaner from [facility] called    BP reading: 150/90 seated and 140/90 standing. The past 2 weeks BP readings have been elevated. Denies any symptoms and has been asymptomatic for the past 2 weeks. Urine is a light yellow color. Overall patient seems to be in good spirits.   Best contact #: [phone]

## 2020-07-14 ENCOUNTER — Other Ambulatory Visit: Payer: Self-pay | Admitting: Radiology

## 2020-07-14 ENCOUNTER — Ambulatory Visit
Admission: RE | Admit: 2020-07-14 | Discharge: 2020-07-14 | Disposition: A | Discharge status: Home / Self Care | Payer: Medicare HMO | Source: Ambulatory Visit | Attending: Radiation Oncology | Admitting: Radiation Oncology

## 2020-07-14 ENCOUNTER — Other Ambulatory Visit: Payer: Self-pay

## 2020-07-14 DIAGNOSIS — C3432 Malignant neoplasm of lower lobe, left bronchus or lung: Secondary | ICD-10-CM | POA: Diagnosis not present

## 2020-07-14 DIAGNOSIS — Z87891 Personal history of nicotine dependence: Secondary | ICD-10-CM | POA: Diagnosis not present

## 2020-07-14 DIAGNOSIS — Z510 Encounter for antineoplastic radiation therapy: Secondary | ICD-10-CM | POA: Diagnosis not present

## 2020-07-14 MED ORDER — TELMISARTAN 80 MG PO TABS: 80.0000 mg | ORAL_TABLET | Freq: Every day | ORAL | 3 refills | Status: DC

## 2020-07-14 NOTE — Telephone Encounter (Signed)
[facility] [place] for increased micardis 80 qd  Needs ROV 1-2 wks

## 2020-07-14 NOTE — Telephone Encounter (Signed)
Pt notified of medication change & verb understanding.  Has no ques/concerns at this time.

## 2020-07-15 ENCOUNTER — Ambulatory Visit (HOSPITAL_COMMUNITY)
Admission: RE | Admit: 2020-07-15 | Discharge: 2020-07-15 | Disposition: A | Discharge status: Home / Self Care | Payer: Medicare HMO | Source: Ambulatory Visit | Attending: Internal Medicine | Admitting: Internal Medicine

## 2020-07-15 ENCOUNTER — Ambulatory Visit
Admission: RE | Admit: 2020-07-15 | Discharge: 2020-07-15 | Disposition: A | Discharge status: Home / Self Care | Payer: Medicare HMO | Source: Ambulatory Visit | Attending: Radiation Oncology | Admitting: Radiation Oncology

## 2020-07-15 ENCOUNTER — Other Ambulatory Visit: Payer: Self-pay | Admitting: Internal Medicine

## 2020-07-15 ENCOUNTER — Other Ambulatory Visit: Payer: Self-pay

## 2020-07-15 DIAGNOSIS — Z79899 Other long term (current) drug therapy: Secondary | ICD-10-CM | POA: Diagnosis not present

## 2020-07-15 DIAGNOSIS — U071 COVID-19: Secondary | ICD-10-CM | POA: Diagnosis not present

## 2020-07-15 DIAGNOSIS — E049 Nontoxic goiter, unspecified: Secondary | ICD-10-CM | POA: Diagnosis not present

## 2020-07-15 DIAGNOSIS — Z888 Allergy status to other drugs, medicaments and biological substances status: Secondary | ICD-10-CM | POA: Insufficient documentation

## 2020-07-15 DIAGNOSIS — R918 Other nonspecific abnormal finding of lung field: Secondary | ICD-10-CM | POA: Diagnosis not present

## 2020-07-15 DIAGNOSIS — C3490 Malignant neoplasm of unspecified part of unspecified bronchus or lung: Secondary | ICD-10-CM | POA: Diagnosis not present

## 2020-07-15 DIAGNOSIS — Z48815 Encounter for surgical aftercare following surgery on the digestive system: Secondary | ICD-10-CM | POA: Diagnosis not present

## 2020-07-15 DIAGNOSIS — Z880 Allergy status to penicillin: Secondary | ICD-10-CM | POA: Insufficient documentation

## 2020-07-15 DIAGNOSIS — I878 Other specified disorders of veins: Secondary | ICD-10-CM

## 2020-07-15 DIAGNOSIS — Z452 Encounter for adjustment and management of vascular access device: Secondary | ICD-10-CM | POA: Diagnosis not present

## 2020-07-15 DIAGNOSIS — C3432 Malignant neoplasm of lower lobe, left bronchus or lung: Secondary | ICD-10-CM | POA: Insufficient documentation

## 2020-07-15 DIAGNOSIS — J4550 Severe persistent asthma, uncomplicated: Secondary | ICD-10-CM | POA: Diagnosis not present

## 2020-07-15 DIAGNOSIS — M19041 Primary osteoarthritis, right hand: Secondary | ICD-10-CM | POA: Diagnosis not present

## 2020-07-15 DIAGNOSIS — Z91013 Allergy to seafood: Secondary | ICD-10-CM | POA: Diagnosis not present

## 2020-07-15 DIAGNOSIS — Z91010 Allergy to peanuts: Secondary | ICD-10-CM | POA: Insufficient documentation

## 2020-07-15 DIAGNOSIS — K565 Intestinal adhesions [bands], unspecified as to partial versus complete obstruction: Secondary | ICD-10-CM | POA: Diagnosis not present

## 2020-07-15 DIAGNOSIS — I10 Essential (primary) hypertension: Secondary | ICD-10-CM | POA: Diagnosis not present

## 2020-07-15 DIAGNOSIS — M170 Bilateral primary osteoarthritis of knee: Secondary | ICD-10-CM | POA: Diagnosis not present

## 2020-07-15 DIAGNOSIS — Z87891 Personal history of nicotine dependence: Secondary | ICD-10-CM | POA: Insufficient documentation

## 2020-07-15 DIAGNOSIS — Z510 Encounter for antineoplastic radiation therapy: Secondary | ICD-10-CM | POA: Diagnosis not present

## 2020-07-15 HISTORY — PX: IR IMAGING GUIDED PORT INSERTION: IMG5740

## 2020-07-15 IMAGING — XA IR IMAGING GUIDED PORT INSERTION
2 series · 3 of 3 positions shown · non-contrast
Comparison: None.

INDICATION: 79-year-old with lung cancer and poor venous access. Port-A-Cath
needed for treatment.

EXAM:
FLUOROSCOPIC AND ULTRASOUND GUIDED PLACEMENT OF A SUBCUTANEOUS PORT

[Series 1: fl (-) angio · 1 of 1 slices shown]
[im 1/1]
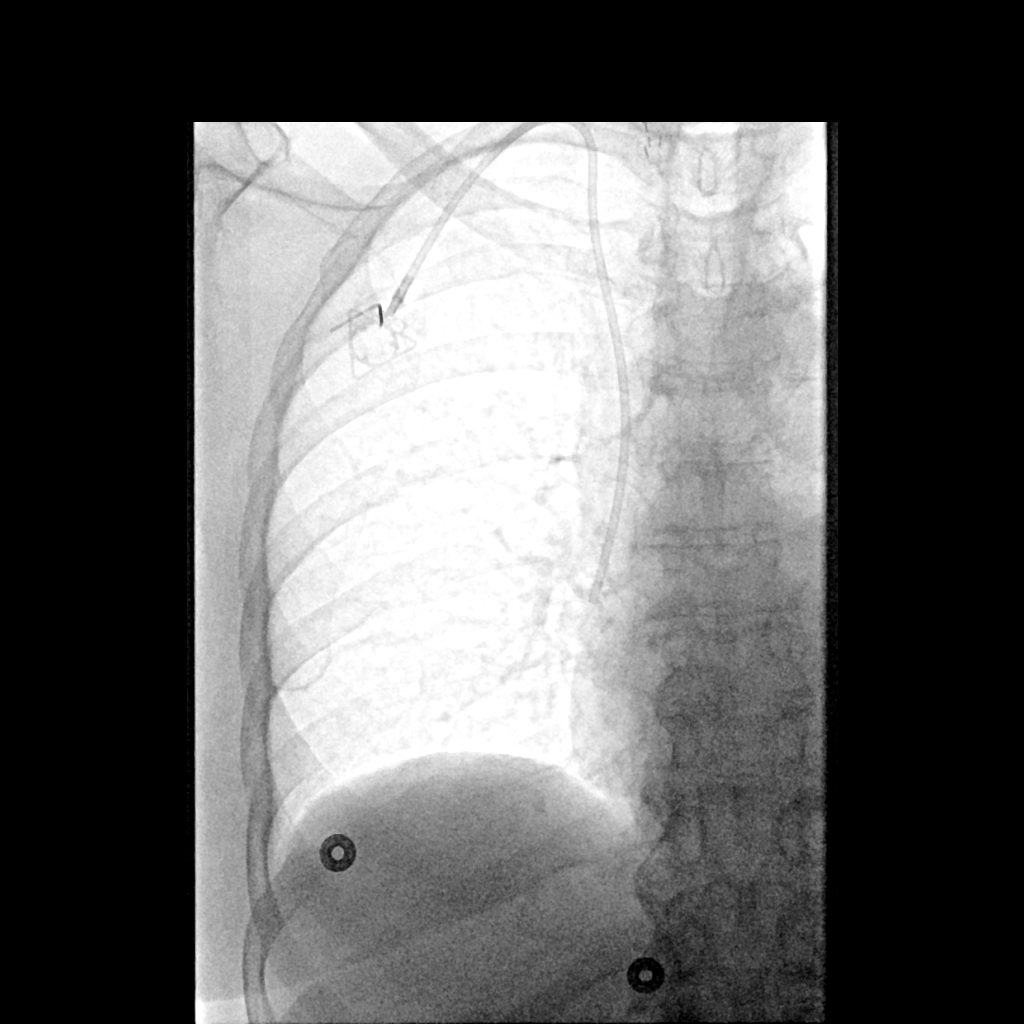

[Series 2: ir imaging guided port insertion · 2 of 2 slices shown]
[im 1/2]
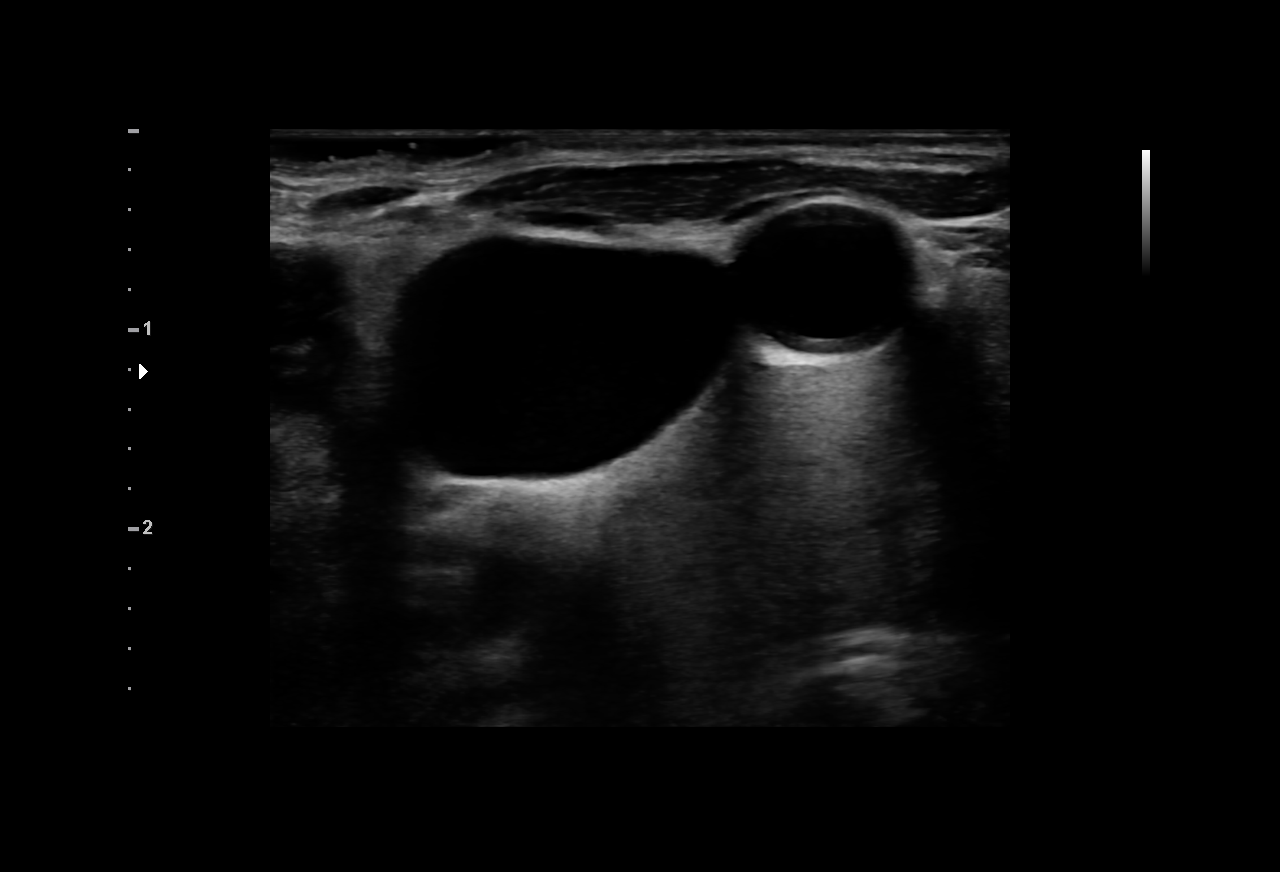
[im 2/2]
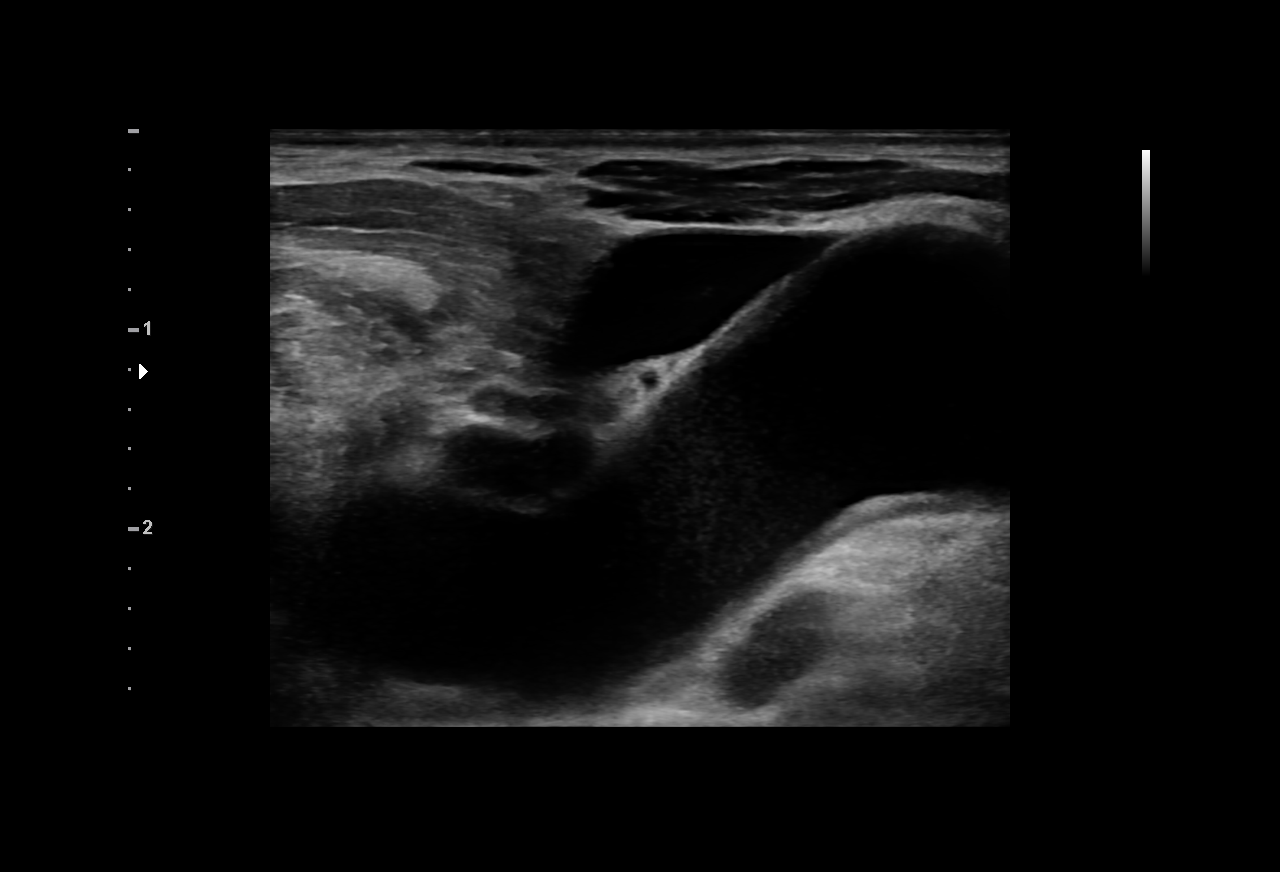

[3 of 3 positions shown; findings below may reference images not displayed]

MEDICATIONS:
Moderate sedation

ANESTHESIA/SEDATION:
Versed 1.0 mg IV; Fentanyl 50 mcg IV;

Moderate Sedation Time:  31 minutes

The patient was continuously monitored during the procedure by the
interventional radiology nurse under my direct supervision.

FLUOROSCOPY TIME:  18 seconds, 1 mGy

COMPLICATIONS:
None immediate.

PROCEDURE:
The procedure, risks, benefits, and alternatives were explained to
the patient. Questions regarding the procedure were encouraged and
answered. The patient understands and consents to the procedure.

Patient was placed supine on the interventional table. Ultrasound
confirmed a patent right internal jugular vein. Ultrasound image was
saved for documentation. The right chest and neck were cleaned with
a skin antiseptic and a sterile drape was placed. Maximal barrier
sterile technique was utilized including caps, mask, sterile gowns,
sterile gloves, sterile drape, hand hygiene and skin antiseptic. The
right neck was anesthetized with 1% lidocaine. Small incision was
made in the right neck with a blade. Micropuncture set was placed in
the right internal jugular vein with ultrasound guidance. The
micropuncture wire was used for measurement purposes. The right
chest was anesthetized with 1% lidocaine with epinephrine. #15 blade
was used to make an incision and a subcutaneous port pocket was
formed. 8 french Power Port was assembled. Subcutaneous tunnel was
formed with a stiff tunneling device. The port catheter was brought
through the subcutaneous tunnel. The port was placed in the
subcutaneous pocket. The micropuncture set was exchanged for a
peel-away sheath. The catheter was placed through the peel-away
sheath and the tip was positioned at the superior cavoatrial
junction. Catheter placement was confirmed with fluoroscopy. The
port was accessed and flushed with heparinized saline. The port
pocket was closed using two layers of absorbable sutures and
Dermabond. The vein skin site was closed using a single layer of
absorbable suture and Dermabond. Sterile dressings were applied.
Patient tolerated the procedure well without an immediate
complication. Ultrasound and fluoroscopic images were taken and
saved for this procedure.
IMPRESSION: Placement of a subcutaneous port device. Catheter tip at the
superior cavoatrial junction.

## 2020-07-15 MED ORDER — FENTANYL CITRATE (PF) 100 MCG/2ML IJ SOLN
INTRAMUSCULAR | Status: AC | PRN
  Administered 2020-07-15: 50 ug via INTRAVENOUS

## 2020-07-15 MED ORDER — LIDOCAINE-EPINEPHRINE 1 %-1:100000 IJ SOLN
INTRAMUSCULAR | Status: AC | PRN
  Administered 2020-07-15: 20 mL via INTRADERMAL

## 2020-07-15 MED ORDER — LIDOCAINE HCL 1 % IJ SOLN
INTRAMUSCULAR | Status: AC
  Filled 2020-07-15: qty 20

## 2020-07-15 MED ORDER — MIDAZOLAM HCL 2 MG/2ML IJ SOLN
INTRAMUSCULAR | Status: AC
  Filled 2020-07-15: qty 4

## 2020-07-15 MED ORDER — HEPARIN SOD (PORK) LOCK FLUSH 100 UNIT/ML IV SOLN
INTRAVENOUS | Status: AC
  Filled 2020-07-15: qty 5

## 2020-07-15 MED ORDER — LIDOCAINE-EPINEPHRINE 1 %-1:100000 IJ SOLN
INTRAMUSCULAR | Status: AC
  Filled 2020-07-15: qty 1

## 2020-07-15 MED ORDER — MIDAZOLAM HCL 2 MG/2ML IJ SOLN
INTRAMUSCULAR | Status: AC | PRN
Start: 2020-07-15 — End: 2020-07-15
  Administered 2020-07-15: 1 mg via INTRAVENOUS

## 2020-07-15 MED ORDER — FENTANYL CITRATE (PF) 100 MCG/2ML IJ SOLN
INTRAMUSCULAR | Status: AC
  Filled 2020-07-15: qty 2

## 2020-07-15 MED ORDER — SODIUM CHLORIDE 0.9 % IV SOLN: INTRAVENOUS | Status: DC

## 2020-07-15 MED ORDER — HEPARIN SOD (PORK) LOCK FLUSH 100 UNIT/ML IV SOLN
INTRAVENOUS | Status: AC | PRN
  Administered 2020-07-15 (×2): 500 [IU] via INTRAVENOUS

## 2020-07-15 MED ORDER — LIDOCAINE HCL 1 % IJ SOLN
INTRAMUSCULAR | Status: AC | PRN
  Administered 2020-07-15: 20 mL via INTRADERMAL

## 2020-07-15 NOTE — Telephone Encounter (Signed)
Pt daughter notified        Ok for that and them f/u at about 100 days after she takes the new one

## 2020-07-15 NOTE — Discharge Instructions (Addendum)
Implanted Port Insertion, Care After This sheet gives you information about how to care for yourself after your procedure. Your health care provider may also give you more specific instructions. If you have problems or questions, contact your health care provider. What can I expect after the procedure? After the procedure, it is common to have:  Discomfort at the port insertion site.  Bruising on the skin over the port. This should improve over 3-4 days. Follow these instructions at home: [facility] care  After your port is placed, you will get a manufacturer's information card. The card has information about your port. Keep this card with you at all times.  Take care of the port as told by your health care provider. Ask your health care provider if you or a family member can get training for taking care of the port at home. A home health care nurse may also take care of the port.  Make sure to remember what type of port you have. Incision care  Follow instructions from your health care provider about how to take care of your port insertion site. Make sure you: ? Wash your hands with soap and water before and after you change your bandage (dressing). If soap and water are not available, use hand sanitizer. ? Change your dressing as told by your health care provider. ? Leave stitches (sutures), skin glue, or adhesive strips in place. These skin closures may need to stay in place for 2 weeks or longer. If adhesive strip edges start to loosen and curl up, you may trim the loose edges. Do not remove adhesive strips completely unless your health care provider tells you to do that.  Check your port insertion site every day for signs of infection. Check for: ? Redness, swelling, or pain. ? Fluid or blood. ? Warmth. ? Pus or a bad smell.      Activity  Return to your normal activities as told by your health care provider. Ask your health care provider what activities are safe for you.  Do not  lift anything that is heavier than 10 lb (4.5 kg), or the limit that you are told, until your health care provider says that it is safe. General instructions  Take over-the-counter and prescription medicines only as told by your health care provider.  Do not take baths, swim, or use a hot tub until your health care provider approves. Ask your health care provider if you may take showers. You may only be allowed to take sponge baths.  Do not drive for 24 hours if you were given a sedative during your procedure.  Wear a medical alert bracelet in case of an emergency. This will tell any health care providers that you have a port.  Keep all follow-up visits as told by your health care provider. This is important. Contact a health care provider if:  You cannot flush your port with saline as directed, or you cannot draw blood from the port.  You have a fever or chills.  You have redness, swelling, or pain around your port insertion site.  You have fluid or blood coming from your port insertion site.  Your port insertion site feels warm to the touch.  You have pus or a bad smell coming from the port insertion site. Get help right away if:  You have chest pain or shortness of breath.  You have bleeding from your port that you cannot control. Summary  Take care of the port as told by your  health care provider. Keep the manufacturer's information card with you at all times.  Change your dressing as told by your health care provider.  Contact a health care provider if you have a fever or chills or if you have redness, swelling, or pain around your port insertion site.  Keep all follow-up visits as told by your health care provider. This information is not intended to replace advice given to you by your health care provider. Make sure you discuss any questions you have with your health care provider. Document Revised: 12/03/2017 Document Reviewed: 12/03/2017 Elsevier Patient Education   2021 [employer]. Moderate Conscious Sedation, Adult Sedation is the use of medicines to promote relaxation and to relieve discomfort and anxiety. Moderate conscious sedation is a type of sedation. Under moderate conscious sedation, you are less alert than normal, but you are still able to respond to instructions, touch, or both. Moderate conscious sedation is used during short medical and dental procedures. It is milder than deep sedation, which is a type of sedation under which you cannot be easily woken up. It is also milder than general anesthesia, which is the use of medicines to make you unconscious. Moderate conscious sedation allows you to return to your regular activities sooner. Tell a health care provider about:  Any allergies you have.  All medicines you are taking, including vitamins, herbs, eye drops, creams, and over-the-counter medicines.  Any use of steroids. This includes steroids taken by mouth or as a cream.  Any problems you or family members have had with sedatives and anesthetic medicines.  Any blood disorders you have.  Any surgeries you have had.  Any medical conditions you have, such as sleep apnea.  Whether you are pregnant or may be pregnant.  Any use of cigarettes, alcohol, marijuana, or drugs. What are the risks? Generally, this is a safe procedure. However, problems may occur, including:  Getting too much medicine (oversedation).  Nausea.  Allergic reaction to medicines.  Trouble breathing. If this happens, a breathing tube may be used. It will be removed when you are awake and breathing on your own.  Heart trouble.  Lung trouble.  Confusion that gets better with time (emergence delirium). What happens before the procedure? Staying hydrated Follow instructions from your health care provider about hydration, which may include:  Up to 2 hours before the procedure - you may continue to drink clear liquids, such as water, clear fruit juice, black  coffee, and plain tea. Eating and drinking restrictions Follow instructions from your health care provider about eating and drinking, which may include:  8 hours before the procedure - stop eating heavy meals or foods, such as meat, fried foods, or fatty foods.  6 hours before the procedure - stop eating light meals or foods, such as toast or cereal.  6 hours before the procedure - stop drinking milk or drinks that contain milk.  2 hours before the procedure - stop drinking clear liquids. Medicines Ask your health care provider about:  Changing or stopping your regular medicines. This is especially important if you are taking diabetes medicines or blood thinners.  Taking medicines such as aspirin and ibuprofen. These medicines can thin your blood. Do not take these medicines unless your health care provider tells you to take them.  Taking over-the-counter medicines, vitamins, herbs, and supplements. Tests and exams  You will have a physical exam.  You may have blood tests done to show how well: ? Your kidneys and liver work. ? Your  blood clots. General instructions  Plan to have a responsible adult take you home from the hospital or clinic.  If you will be going home right after the procedure, plan to have a responsible adult care for you for the time you are told. This is important. What happens during the procedure?  You will be given the sedative. The sedative may be given: ? As a pill that you will swallow. It can also be inserted into the rectum. ? As a spray through the nose. ? As an injection into the muscle. ? As an injection into the vein through an IV.  You may be given oxygen as needed.  Your breathing, heart rate, and blood pressure will be monitored during the procedure.  The medical or dental procedure will be done. The procedure may vary among health care providers and hospitals.   What happens after the procedure?  Your blood pressure, heart rate,  breathing rate, and blood oxygen level will be monitored until you leave the hospital or clinic.  You will get fluids through your IV if needed.  Do not drive or operate machinery until your health care provider says that it is safe. Summary  Sedation is the use of medicines to promote relaxation and to relieve discomfort and anxiety. Moderate conscious sedation is a type of sedation that is used during short medical and dental procedures.  Tell the health care provider about any medical conditions that you have and about all the medicines that you are taking.  You will be given the sedative as a pill, a spray through the nose, an injection into the muscle, or an injection into the vein through an IV. Vital signs are monitored during the sedation.  Moderate conscious sedation allows you to return to your regular activities sooner. This information is not intended to replace advice given to you by your health care provider. Make sure you discuss any questions you have with your health care provider. Document Revised: 09/04/2019 Document Reviewed: 04/02/2019 Elsevier Patient Education  2021 [employer].

## 2020-07-15 NOTE — Telephone Encounter (Signed)
Ok for that and them f/u at about 100 days after she takes the new one

## 2020-07-15 NOTE — H&P (Signed)
Chief Complaint: Patient was seen in consultation today for port-a-catheter placement   Referring Physician(s): Curt Bears  Supervising Physician: Markus Daft  Patient Status: Angelica [name] - Out-pt  History of Present Illness: Angelica [name] is a 80 y.o. female with a medical history significant for HLD, HTN and asthma. She had a recent hospitalization (05/17/20 - 06/03/20) for a small bowel obstruction and during her work up she had incidental findings of COVID and a left-sided lower lung mass with associated mediastinal adenopathy. She followed up with pulmonology outpatient and underwent a bronchoscopy with tissue biopsies 06/10/20. Pathology returned positive for adenocarcinoma. She has since completed one cycle of chemotherapy and is in need of reliable venous access.   Interventional Radiology has been asked to evaluate this patient for an image-guided port-a-catheter placement to facilitate her treatment plans.   Past Medical History:  Diagnosis Date  . Arthritis   . Asthma   . Bowel obstruction ([place])   . Environmental allergies   . GERD (gastroesophageal reflux disease)   . Glaucoma 05/09/2017  . H. pylori infection   . HLD (hyperlipidemia) 01/16/2019  . Hypertension   . Iron deficiency anemia   . Osteopenia 05/26/2017  . Thyroid disease   . Urticaria 07/25/2018    Past Surgical History:  Procedure Laterality Date  . ABDOMINAL HYSTERECTOMY    . BOWEL RESECTION N/A 05/23/2020   Procedure: SMALL BOWEL REPAIR;  Surgeon: Kinsinger, Arta Bruce, MD;  Location: [facility];  Service: General;  Laterality: N/A;  . BREAST BIOPSY    . BRONCHIAL BIOPSY  06/10/2020   Procedure: BRONCHIAL BIOPSIES;  Surgeon: Garner Nash, DO;  Location: [facility];  Service: Pulmonary;;  . BRONCHIAL BRUSHINGS  06/10/2020   Procedure: BRONCHIAL BRUSHINGS;  Surgeon: Garner Nash, DO;  Location: [place] [facility];  Service: Pulmonary;;  . BRONCHIAL NEEDLE ASPIRATION BIOPSY  06/10/2020   Procedure:  BRONCHIAL NEEDLE ASPIRATION BIOPSIES;  Surgeon: Garner Nash, DO;  Location: [facility];  Service: Pulmonary;;  . BRONCHIAL WASHINGS  06/10/2020   Procedure: BRONCHIAL WASHINGS;  Surgeon: Garner Nash, DO;  Location: [facility] [facility];  Service: Pulmonary;;  . COLON SURGERY    . DIAGNOSTIC LARYNGOSCOPY N/A 05/23/2020   Procedure: ATTEMPTED DIAGNOSTIC LAPAROSCOPY WITH ADHESIONS;  Surgeon: Kieth Brightly, Arta Bruce, MD;  Location: [facility];  Service: General;  Laterality: N/A;  . KNEE SURGERY    . LAPAROTOMY N/A 04/18/2015   Procedure: Exploratory laparotomy with lysis of adhesions, possible bowel resection;  Surgeon: Ralene Ok, MD;  Location: [facility];  Service: General;  Laterality: N/A;  . LAPAROTOMY N/A 05/23/2020   Procedure: EXPLORATORY LAPAROTOMY;  Surgeon: Kieth Brightly Arta Bruce, MD;  Location: [facility];  Service: General;  Laterality: N/A;  . LYSIS OF ADHESION N/A 05/23/2020   Procedure: LYSIS OF ADHESION;  Surgeon: Kieth Brightly Arta Bruce, MD;  Location: [facility];  Service: General;  Laterality: N/A;  . NASAL SINUS SURGERY    . THYROID SURGERY    . VIDEO BRONCHOSCOPY WITH ENDOBRONCHIAL NAVIGATION Bilateral 06/10/2020   Procedure: VIDEO BRONCHOSCOPY WITH ENDOBRONCHIAL NAVIGATION;  Surgeon: Garner Nash, DO;  Location: [facility];  Service: Pulmonary;  Laterality: Bilateral;  . VIDEO BRONCHOSCOPY WITH ENDOBRONCHIAL ULTRASOUND  06/10/2020   Procedure: VIDEO BRONCHOSCOPY WITH ENDOBRONCHIAL ULTRASOUND;  Surgeon: Garner Nash, DO;  Location: [facility];  Service: Pulmonary;;    Allergies: Asa buff (mag [buffered aspirin], Ensure [nutritional supplements], Fish allergy, Milk-related compounds, Other, Peanut-containing drug products, Penicillins, Shellfish-derived products, and Strawberry extract  Medications: Prior to  Admission medications   Medication Sig Start Date End Date Taking? Authorizing Provider  acetaminophen (TYLENOL) 325 MG tablet Take 2 tablets (650 mg total) by mouth every 6  (six) hours as needed for headache. Patient taking differently: Take 325 mg by mouth every 6 (six) hours as needed for headache or mild pain. 06/03/20  Yes Saverio Danker, PA-C  amLODipine (NORVASC) 10 MG tablet Take 1 tablet (10 mg total) by mouth daily. 06/14/20  Yes [name], MD  Benralizumab [facility] PEN) 30 MG/ML SOAJ Inject into the skin every 8 (eight) weeks.   Yes [provider]  budesonide-formoterol (SYMBICORT) 80-4.5 MCG/ACT inhaler Inhale 2 puffs into the lungs 2 (two) times daily. Use for asthma flares 2 puffs twice a day for 2 weeks or until cough and wheeze free Patient taking differently: Inhale 2 puffs into the lungs 2 (two) times daily as needed (for asthma flares, for 2 weeks, or until cough and wheeze-free). 01/28/20  Yes Padgett, Rae Halsted, MD  Cholecalciferol (VITAMIN D3) 50 MCG (2000 UT) TABS Take 2,000 Units by mouth daily.   Yes [provider]  EPINEPHrine (EPIPEN 2-PAK) 0.3 mg/0.3 mL IJ SOAJ injection Use as directed for severe allergic reaction Patient taking differently: Inject 0.3 mg into the muscle as needed for anaphylaxis. 01/28/20  Yes Padgett, Rae Halsted, MD  ipratropium (ATROVENT) 0.06 % nasal spray Place 2 sprays into both nostrils 3 (three) times daily. Patient taking differently: Place 1 spray into both nostrils 3 (three) times daily as needed (allergies.). 11/12/19  Yes Padgett, Rae Halsted, MD  linaclotide [name]) 72 MCG capsule TAKE 1 CAPSULE BY MOUTH ONCE DAILY AS NEEDED Patient taking differently: Take 72 mcg by mouth daily as needed (constipation). 06/14/20  Yes [name], MD  loratadine (CLARITIN) 10 MG tablet Take 10 mg by mouth daily.   Yes [provider]  losartan (COZAAR) 100 MG tablet Take 100 mg by mouth daily.   Yes [provider]  Magnesium Hydroxide (PH[place]IPS MILK OF MAGNESIA PO) Take 15-30 mLs by mouth daily as needed (for constipation).   Yes [provider]   mometasone-formoterol (DULERA) 100-5 MCG/ACT AERO Inhale 1 puff into the lungs daily.   Yes [provider]  pravastatin (PRAVACHOL) 80 MG tablet Take 1 tablet (80 mg total) by mouth daily. 06/14/20  Yes [name], MD  prochlorperazine (COMPAZINE) 10 MG tablet Take 1 tablet (10 mg total) by mouth every 6 (six) hours as needed for nausea or vomiting. 06/22/20  Yes Curt Bears, MD  traMADol (ULTRAM) 50 MG tablet Take 1 tablet (50 mg total) by mouth every 6 (six) hours as needed for moderate pain. 06/03/20  Yes Saverio Danker, PA-C  vitamin B-12 (CYANOCOBALAMIN) 1000 MCG tablet Take 1,000 mcg by mouth daily.   Yes [provider]  telmisartan (MICARDIS) 80 MG tablet Take 1 tablet (80 mg total) by mouth daily. Patient taking differently: Take 80 mg by mouth daily. Will take when losartan runs out ( has been on B/O) 07/14/20   [name], MD     Family History  Problem Relation Age of Onset  . Diabetes Mother   . Hypertension Mother   . Hypertension Father   . Glaucoma Sister   . Glaucoma Brother   . Allergic rhinitis Neg Hx   . Angioedema Neg Hx   . Asthma Neg Hx   . Eczema Neg Hx   . Immunodeficiency Neg Hx   . Urticaria Neg Hx  Social History   Socioeconomic History  . Marital status: Widowed    Spouse name: Not on file  . Number of children: 4  . Years of education: Not on file  . Highest education level: Not on file  Occupational History  . Occupation: retired  Tobacco Use  . Smoking status: Former Smoker    Types: Cigarettes  . Smokeless tobacco: Never Used  Vaping Use  . Vaping Use: Never used  Substance and Sexual Activity  . Alcohol use: No  . Drug use: No  . Sexual activity: Never  Other Topics Concern  . Not on file  Social History Narrative  . Not on file   Social Determinants of Health   Financial Resource Strain: Low Risk   . Difficulty of Paying Living Expenses: Not hard at all  Food Insecurity: No Food Insecurity  .  Worried About Charity fundraiser in the Last Year: Never true  . Ran Out of Food in the Last Year: Never true  Transportation Needs: No Transportation Needs  . Lack of Transportation (Medical): No  . Lack of Transportation (Non-Medical): No  Physical Activity: Sufficiently Active  . Days of Exercise per Week: 5 days  . Minutes of Exercise per Session: 30 min  Stress: No Stress Concern Present  . Feeling of Stress : Not at all  Social Connections: Moderately Integrated  . Frequency of Communication with Friends and Family: More than three times a week  . Frequency of Social Gatherings with Friends and Family: More than three times a week  . Attends Religious Services: More than 4 times per year  . Active Member of Clubs or Organizations: Yes  . Attends Archivist Meetings: More than 4 times per year  . Marital Status: Widowed    Review of Systems: A 12 point ROS discussed and pertinent positives are indicated in the HPI above.  All other systems are negative.  Review of Systems  Constitutional: Negative for appetite change and fatigue.  Respiratory: Negative for cough and shortness of breath.   Gastrointestinal: Negative for diarrhea, nausea and vomiting.  Musculoskeletal: Negative for back pain.  Neurological: Negative for headaches.    Vital Signs: BP 123/88   Pulse 86   Temp 98.6 F (37 C) (Oral)   Ht 5\' 5"  (1.651 m)   Wt 160 lb (72.6 kg)   SpO2 100%   BMI 26.63 kg/m   Physical Exam Constitutional:      General: She is not in acute distress. HENT:     Mouth/Throat:     Mouth: Mucous membranes are moist.     Pharynx: Oropharynx is clear.  Cardiovascular:     Rate and Rhythm: Normal rate and regular rhythm.     Pulses: Normal pulses.     Heart sounds: Normal heart sounds.  Pulmonary:     Effort: Pulmonary effort is normal.     Breath sounds: Normal breath sounds.  Abdominal:     General: Bowel sounds are normal.     Palpations: Abdomen is soft.   Musculoskeletal:        General: Normal range of motion.     Right lower leg: No edema.     Left lower leg: No edema.  Skin:    General: Skin is warm and dry.  Neurological:     Mental Status: She is alert and oriented to person, place, and time.     Imaging: No results found.  Labs:  CBC: Recent Labs  05/30/20 0340 06/22/20 1340 07/04/20 0803 07/11/20 0908  WBC 12.8* 9.2 8.6 6.5  HGB 11.3* 12.3 13.3 13.0  HCT 35.0* 38.1 40.4 38.9  PLT 253 271 267 208    COAGS: No results for input(s): INR, APTT in the last 8760 hours.  BMP: Recent Labs    06/01/20 0414 06/22/20 1340 07/04/20 0803 07/11/20 0908  NA 134* 141 140 139  K 4.6 3.3* 3.4* 3.2*  CL 102 104 105 107  CO2 23 26 23 22   GLUCOSE 98 121* 93 106*  BUN 27* 8 7* 9  CALCIUM 8.3* 9.4 9.8 9.2  CREATININE 0.94 0.87 0.86 0.80  GFRNONAA >60 >60 >60 >60    LIVER FUNCTION TESTS: Recent Labs    05/30/20 0340 06/22/20 1340 07/04/20 0803 07/11/20 0908  BILITOT 0.6 0.4 0.6 0.6  AST 140* 21 20 18   ALT 245* 18 13 14   ALKPHOS 107 90 84 73  PROT 6.0* 7.6 7.9 7.1  ALBUMIN 2.5* 3.8 4.1 3.7    TUMOR MARKERS: No results for input(s): AFPTM, CEA, CA199, CHROMGRNA in the last 8760 hours.  Assessment and Plan:  Non-small cell lung cancer; chemotherapy: Angelica [name], 80 year old female, presents today to the [facility] for an image-guided port-a-catheter placement.   Risks and benefits of an image-guided port-a-catheter placement were discussed with the patient including, but not limited to bleeding, infection, pneumothorax, or fibrin sheath development and need for additional procedures.  All of the patient's questions were answered, patient is agreeable to proceed. She has been NPO.   Consent signed and in chart.  Thank you for this interesting consult.  I greatly enjoyed meeting Angelica [name] and look forward to participating in their care.  A copy of this report  was sent to the requesting provider on this date.  Electronically Signed: Soyla Dryer, AGACNP-BC [phone] 07/15/2020, 1:28 PM   I spent a total of  30 Minutes   in face to face in clinical consultation, greater than 50% of which was counseling/coordinating care for image-guided port-a-catheter placement.

## 2020-07-15 NOTE — Procedures (Signed)
Interventional Radiology Procedure:   Indications: Stage IIIB (T2 a, N3, M0) non-small cell lung cancer and needs port for chemotherapy.  Procedure: Port placement  Findings: Right jugular port, tip at SVC/RA junction  Complications: None     EBL: Minimal, less than 10 ml  Plan: Discharge in one hour.  Keep port site and incisions dry for at least 24 hours.     Anapaula Severt R. Anselm Pancoast, MD  Pager: [phone]

## 2020-07-18 ENCOUNTER — Inpatient Hospital Stay: Payer: Medicare HMO

## 2020-07-18 ENCOUNTER — Ambulatory Visit
Admission: RE | Admit: 2020-07-18 | Discharge: 2020-07-18 | Disposition: A | Discharge status: Home / Self Care | Payer: Medicare HMO | Source: Ambulatory Visit | Attending: Radiation Oncology | Admitting: Radiation Oncology

## 2020-07-18 ENCOUNTER — Telehealth: Payer: Self-pay | Admitting: Internal Medicine

## 2020-07-18 ENCOUNTER — Other Ambulatory Visit: Payer: Self-pay

## 2020-07-18 VITALS — BP 99/61 | HR 88 | Temp 97.9°F | Resp 18 | Ht 65.0 in | Wt 161.0 lb

## 2020-07-18 DIAGNOSIS — K219 Gastro-esophageal reflux disease without esophagitis: Secondary | ICD-10-CM | POA: Diagnosis not present

## 2020-07-18 DIAGNOSIS — Z510 Encounter for antineoplastic radiation therapy: Secondary | ICD-10-CM | POA: Diagnosis not present

## 2020-07-18 DIAGNOSIS — D509 Iron deficiency anemia, unspecified: Secondary | ICD-10-CM | POA: Diagnosis not present

## 2020-07-18 DIAGNOSIS — R634 Abnormal weight loss: Secondary | ICD-10-CM | POA: Diagnosis not present

## 2020-07-18 DIAGNOSIS — C3432 Malignant neoplasm of lower lobe, left bronchus or lung: Secondary | ICD-10-CM | POA: Diagnosis not present

## 2020-07-18 DIAGNOSIS — M1990 Unspecified osteoarthritis, unspecified site: Secondary | ICD-10-CM | POA: Diagnosis not present

## 2020-07-18 DIAGNOSIS — C3492 Malignant neoplasm of unspecified part of left bronchus or lung: Secondary | ICD-10-CM

## 2020-07-18 DIAGNOSIS — Z5111 Encounter for antineoplastic chemotherapy: Secondary | ICD-10-CM | POA: Diagnosis not present

## 2020-07-18 DIAGNOSIS — Z87891 Personal history of nicotine dependence: Secondary | ICD-10-CM | POA: Diagnosis not present

## 2020-07-18 DIAGNOSIS — R5383 Other fatigue: Secondary | ICD-10-CM | POA: Diagnosis not present

## 2020-07-18 DIAGNOSIS — Z95828 Presence of other vascular implants and grafts: Secondary | ICD-10-CM

## 2020-07-18 DIAGNOSIS — Z79899 Other long term (current) drug therapy: Secondary | ICD-10-CM | POA: Diagnosis not present

## 2020-07-18 LAB — CBC WITH DIFFERENTIAL (CANCER CENTER ONLY)
Abs Immature Granulocytes: 0.02 10*3/uL (ref 0.00–0.07)
Basophils Absolute: 0 10*3/uL (ref 0.0–0.1)
Basophils Relative: 0 %
Eosinophils Absolute: 0 10*3/uL (ref 0.0–0.5)
Eosinophils Relative: 0 %
HCT: 33.4 % — ABNORMAL LOW (ref 36.0–46.0)
Hemoglobin: 11.3 g/dL — ABNORMAL LOW (ref 12.0–15.0)
Immature Granulocytes: 1 %
Lymphocytes Relative: 16 %
Lymphs Abs: 0.6 10*3/uL — ABNORMAL LOW (ref 0.7–4.0)
MCH: 30 pg (ref 26.0–34.0)
MCHC: 33.8 g/dL (ref 30.0–36.0)
MCV: 88.6 fL (ref 80.0–100.0)
Monocytes Absolute: 0.3 10*3/uL (ref 0.1–1.0)
Monocytes Relative: 7 %
Neutro Abs: 3.2 10*3/uL (ref 1.7–7.7)
Neutrophils Relative %: 76 %
Platelet Count: 155 10*3/uL (ref 150–400)
RBC: 3.77 MIL/uL — ABNORMAL LOW (ref 3.87–5.11)
RDW: 12.9 % (ref 11.5–15.5)
WBC Count: 4.1 10*3/uL (ref 4.0–10.5)
nRBC: 0 % (ref 0.0–0.2)

## 2020-07-18 LAB — CMP (CANCER CENTER ONLY)
ALT: 12 U/L (ref 0–44)
AST: 18 U/L (ref 15–41)
Albumin: 3.5 g/dL (ref 3.5–5.0)
Alkaline Phosphatase: 64 U/L (ref 38–126)
Anion gap: 10 (ref 5–15)
BUN: 11 mg/dL (ref 8–23)
CO2: 22 mmol/L (ref 22–32)
Calcium: 8.8 mg/dL — ABNORMAL LOW (ref 8.9–10.3)
Chloride: 107 mmol/L (ref 98–111)
Creatinine: 0.71 mg/dL (ref 0.44–1.00)
GFR, Estimated: >60 mL/min (ref >60)
Glucose, Bld: 96 mg/dL (ref 70–99)
Potassium: 3.1 mmol/L — ABNORMAL LOW (ref 3.5–5.1)
Sodium: 139 mmol/L (ref 135–145)
Total Bilirubin: 0.6 mg/dL (ref 0.3–1.2)
Total Protein: 6.7 g/dL (ref 6.5–8.1)

## 2020-07-18 MED ORDER — DIPHENHYDRAMINE HCL 50 MG/ML IJ SOLN
50.0000 mg | Freq: Once | INTRAMUSCULAR | Status: AC
  Administered 2020-07-18: 50 mg via INTRAVENOUS

## 2020-07-18 MED ORDER — SODIUM CHLORIDE 0.9% FLUSH
10.0000 mL | INTRAVENOUS | Status: DC | PRN
  Administered 2020-07-18: 10 mL via INTRAVENOUS
  Filled 2020-07-18: qty 10

## 2020-07-18 MED ORDER — PALONOSETRON HCL INJECTION 0.25 MG/5ML
INTRAVENOUS | Status: AC
  Filled 2020-07-18: qty 5

## 2020-07-18 MED ORDER — SODIUM CHLORIDE 0.9 % IV SOLN
Freq: Once | INTRAVENOUS | Status: AC
  Filled 2020-07-18: qty 250

## 2020-07-18 MED ORDER — DEXAMETHASONE SODIUM PHOSPHATE 100 MG/10ML IJ SOLN
20.0000 mg | Freq: Once | INTRAMUSCULAR | Status: AC
  Administered 2020-07-18: 20 mg via INTRAVENOUS
  Filled 2020-07-18: qty 20

## 2020-07-18 MED ORDER — FAMOTIDINE IN NACL 20-0.9 MG/50ML-% IV SOLN
INTRAVENOUS | Status: AC
  Filled 2020-07-18: qty 50

## 2020-07-18 MED ORDER — FAMOTIDINE IN NACL 20-0.9 MG/50ML-% IV SOLN
20.0000 mg | Freq: Once | INTRAVENOUS | Status: AC
  Administered 2020-07-18: 20 mg via INTRAVENOUS

## 2020-07-18 MED ORDER — SODIUM CHLORIDE 0.9 % IV SOLN
45.0000 mg/m2 | Freq: Once | INTRAVENOUS | Status: AC
  Administered 2020-07-18: 84 mg via INTRAVENOUS
  Filled 2020-07-18: qty 14

## 2020-07-18 MED ORDER — HEPARIN SOD (PORK) LOCK FLUSH 100 UNIT/ML IV SOLN
500.0000 [IU] | Freq: Once | INTRAVENOUS | Status: AC | PRN
  Administered 2020-07-18: 500 [IU]
  Filled 2020-07-18: qty 5

## 2020-07-18 MED ORDER — CARBOPLATIN CHEMO INJECTION 450 MG/45ML
160.0000 mg | Freq: Once | INTRAVENOUS | Status: AC
Start: 2020-07-18 — End: 2020-07-18
  Administered 2020-07-18: 160 mg via INTRAVENOUS
  Filled 2020-07-18: qty 16

## 2020-07-18 MED ORDER — SODIUM CHLORIDE 0.9% FLUSH
10.0000 mL | INTRAVENOUS | Status: DC | PRN
Start: 2020-07-18 — End: 2020-07-18
  Administered 2020-07-18: 10 mL
  Filled 2020-07-18: qty 10

## 2020-07-18 MED ORDER — DIPHENHYDRAMINE HCL 50 MG/ML IJ SOLN
INTRAMUSCULAR | Status: AC
  Filled 2020-07-18: qty 1

## 2020-07-18 MED ORDER — PALONOSETRON HCL INJECTION 0.25 MG/5ML
0.2500 mg | Freq: Once | INTRAVENOUS | Status: AC
  Administered 2020-07-18: 0.25 mg via INTRAVENOUS

## 2020-07-18 NOTE — Patient Instructions (Signed)
   [facility] Discharge Instructions for Patients Receiving Chemotherapy  Today you received the following chemotherapy agents Taxol and Carboplatin   To help prevent nausea and vomiting after your treatment, we encourage you to take your nausea medication as directed.    If you develop nausea and vomiting that is not controlled by your nausea medication, call the clinic.   BELOW ARE SYMPTOMS THAT SHOULD BE REPORTED IMMEDIATELY:  *FEVER GREATER THAN 100.5 F  *CHILLS WITH OR WITHOUT FEVER  NAUSEA AND VOMITING THAT IS NOT CONTROLLED WITH YOUR NAUSEA MEDICATION  *UNUSUAL SHORTNESS OF BREATH  *UNUSUAL BRUISING OR BLEEDING  TENDERNESS IN MOUTH AND THROAT WITH OR WITHOUT PRESENCE OF ULCERS  *URINARY PROBLEMS  *BOWEL PROBLEMS  UNUSUAL RASH Items with * indicate a potential emergency and should be followed up as soon as possible.  Feel free to call the clinic should you have any questions or concerns. The clinic phone number is [phone].  Please show the CHEMO ALERT CARD at check-in to the Emergency Department and triage nurse.   

## 2020-07-18 NOTE — Telephone Encounter (Signed)
Mary from [facility] has called wanting to confirm that the patient has stopped taking Losartan in place of the new prescription Telmisartan before giving out to the patient   Stanton Kidney- [phone]

## 2020-07-18 NOTE — Patient Instructions (Signed)

## 2020-07-18 NOTE — Telephone Encounter (Signed)
I cannot attest to that since I dont live with the patient and cannot know this  Ok to call pt to let he know that we recommend that she stop the losartan and only take the micardis, thanks

## 2020-07-19 ENCOUNTER — Other Ambulatory Visit: Payer: Self-pay

## 2020-07-19 ENCOUNTER — Ambulatory Visit
Admission: RE | Admit: 2020-07-19 | Discharge: 2020-07-19 | Disposition: A | Discharge status: Home / Self Care | Payer: Medicare HMO | Source: Ambulatory Visit | Attending: Radiation Oncology | Admitting: Radiation Oncology

## 2020-07-19 ENCOUNTER — Telehealth: Payer: Self-pay | Admitting: Medical Oncology

## 2020-07-19 DIAGNOSIS — C3432 Malignant neoplasm of lower lobe, left bronchus or lung: Secondary | ICD-10-CM | POA: Diagnosis not present

## 2020-07-19 DIAGNOSIS — Z87891 Personal history of nicotine dependence: Secondary | ICD-10-CM | POA: Diagnosis not present

## 2020-07-19 DIAGNOSIS — Z510 Encounter for antineoplastic radiation therapy: Secondary | ICD-10-CM | POA: Diagnosis not present

## 2020-07-19 NOTE — Telephone Encounter (Addendum)
Angelica Bears, MD  [place] [facility] Please advise the patient to increase her potassium rich diet. Thank you.        Pt and grandaughter notified .

## 2020-07-20 ENCOUNTER — Other Ambulatory Visit: Payer: Self-pay

## 2020-07-20 ENCOUNTER — Ambulatory Visit
Admission: RE | Admit: 2020-07-20 | Discharge: 2020-07-20 | Disposition: A | Discharge status: Home / Self Care | Payer: Medicare HMO | Source: Ambulatory Visit | Attending: Radiation Oncology | Admitting: Radiation Oncology

## 2020-07-20 ENCOUNTER — Telehealth: Payer: Self-pay | Admitting: Internal Medicine

## 2020-07-20 DIAGNOSIS — Z87891 Personal history of nicotine dependence: Secondary | ICD-10-CM | POA: Diagnosis not present

## 2020-07-20 DIAGNOSIS — C3432 Malignant neoplasm of lower lobe, left bronchus or lung: Secondary | ICD-10-CM | POA: Diagnosis not present

## 2020-07-20 DIAGNOSIS — Z510 Encounter for antineoplastic radiation therapy: Secondary | ICD-10-CM | POA: Diagnosis not present

## 2020-07-20 NOTE — Telephone Encounter (Signed)
Patient notified to hold the telemisartan as the pt wants to finish the complete losartan first

## 2020-07-20 NOTE — Telephone Encounter (Signed)
Spoke wit pt daughter and she states that pt will not began new med until old one has been complete.

## 2020-07-20 NOTE — Telephone Encounter (Signed)
[facility] calling wondering if the telmisartan (MICARDIS) 80 MG tablet is replacing the losartan (COZAAR) 100 MG tablet

## 2020-07-21 ENCOUNTER — Ambulatory Visit
Admission: RE | Admit: 2020-07-21 | Discharge: 2020-07-21 | Disposition: A | Discharge status: Home / Self Care | Payer: Medicare HMO | Source: Ambulatory Visit | Attending: Radiation Oncology | Admitting: Radiation Oncology

## 2020-07-21 DIAGNOSIS — Z510 Encounter for antineoplastic radiation therapy: Secondary | ICD-10-CM | POA: Diagnosis not present

## 2020-07-21 DIAGNOSIS — C3432 Malignant neoplasm of lower lobe, left bronchus or lung: Secondary | ICD-10-CM | POA: Diagnosis not present

## 2020-07-21 DIAGNOSIS — Z87891 Personal history of nicotine dependence: Secondary | ICD-10-CM | POA: Diagnosis not present

## 2020-07-22 ENCOUNTER — Other Ambulatory Visit: Payer: Self-pay

## 2020-07-22 ENCOUNTER — Ambulatory Visit
Admission: RE | Admit: 2020-07-22 | Discharge: 2020-07-22 | Disposition: A | Discharge status: Home / Self Care | Payer: Medicare HMO | Source: Ambulatory Visit | Attending: Radiation Oncology | Admitting: Radiation Oncology

## 2020-07-22 DIAGNOSIS — Z87891 Personal history of nicotine dependence: Secondary | ICD-10-CM | POA: Diagnosis not present

## 2020-07-22 DIAGNOSIS — R918 Other nonspecific abnormal finding of lung field: Secondary | ICD-10-CM | POA: Diagnosis not present

## 2020-07-22 DIAGNOSIS — M170 Bilateral primary osteoarthritis of knee: Secondary | ICD-10-CM | POA: Diagnosis not present

## 2020-07-22 DIAGNOSIS — I10 Essential (primary) hypertension: Secondary | ICD-10-CM | POA: Diagnosis not present

## 2020-07-22 DIAGNOSIS — K565 Intestinal adhesions [bands], unspecified as to partial versus complete obstruction: Secondary | ICD-10-CM | POA: Diagnosis not present

## 2020-07-22 DIAGNOSIS — U071 COVID-19: Secondary | ICD-10-CM | POA: Diagnosis not present

## 2020-07-22 DIAGNOSIS — M19041 Primary osteoarthritis, right hand: Secondary | ICD-10-CM | POA: Diagnosis not present

## 2020-07-22 DIAGNOSIS — Z48815 Encounter for surgical aftercare following surgery on the digestive system: Secondary | ICD-10-CM | POA: Diagnosis not present

## 2020-07-22 DIAGNOSIS — Z510 Encounter for antineoplastic radiation therapy: Secondary | ICD-10-CM | POA: Diagnosis not present

## 2020-07-22 DIAGNOSIS — E049 Nontoxic goiter, unspecified: Secondary | ICD-10-CM | POA: Diagnosis not present

## 2020-07-22 DIAGNOSIS — C3432 Malignant neoplasm of lower lobe, left bronchus or lung: Secondary | ICD-10-CM | POA: Diagnosis not present

## 2020-07-22 DIAGNOSIS — J4550 Severe persistent asthma, uncomplicated: Secondary | ICD-10-CM | POA: Diagnosis not present

## 2020-07-25 ENCOUNTER — Ambulatory Visit
Admission: RE | Admit: 2020-07-25 | Discharge: 2020-07-25 | Disposition: A | Discharge status: Home / Self Care | Payer: Medicare HMO | Source: Ambulatory Visit | Attending: Radiation Oncology | Admitting: Radiation Oncology

## 2020-07-25 ENCOUNTER — Encounter: Payer: Self-pay | Admitting: *Deleted

## 2020-07-25 ENCOUNTER — Inpatient Hospital Stay: Payer: Medicare HMO

## 2020-07-25 ENCOUNTER — Inpatient Hospital Stay (HOSPITAL_BASED_OUTPATIENT_CLINIC_OR_DEPARTMENT_OTHER): Payer: Medicare HMO | Admitting: Internal Medicine

## 2020-07-25 ENCOUNTER — Other Ambulatory Visit: Payer: Self-pay

## 2020-07-25 ENCOUNTER — Other Ambulatory Visit: Payer: Self-pay | Admitting: Radiation Oncology

## 2020-07-25 ENCOUNTER — Inpatient Hospital Stay: Payer: Medicare HMO | Attending: Internal Medicine

## 2020-07-25 VITALS — BP 138/72 | HR 96 | Temp 97.8°F | Resp 14 | Ht 65.0 in | Wt 161.7 lb

## 2020-07-25 DIAGNOSIS — C3432 Malignant neoplasm of lower lobe, left bronchus or lung: Secondary | ICD-10-CM | POA: Diagnosis not present

## 2020-07-25 DIAGNOSIS — M1990 Unspecified osteoarthritis, unspecified site: Secondary | ICD-10-CM | POA: Diagnosis not present

## 2020-07-25 DIAGNOSIS — Z87891 Personal history of nicotine dependence: Secondary | ICD-10-CM | POA: Diagnosis not present

## 2020-07-25 DIAGNOSIS — K219 Gastro-esophageal reflux disease without esophagitis: Secondary | ICD-10-CM | POA: Insufficient documentation

## 2020-07-25 DIAGNOSIS — E785 Hyperlipidemia, unspecified: Secondary | ICD-10-CM | POA: Insufficient documentation

## 2020-07-25 DIAGNOSIS — C781 Secondary malignant neoplasm of mediastinum: Secondary | ICD-10-CM | POA: Diagnosis not present

## 2020-07-25 DIAGNOSIS — Z79899 Other long term (current) drug therapy: Secondary | ICD-10-CM | POA: Insufficient documentation

## 2020-07-25 DIAGNOSIS — Z95828 Presence of other vascular implants and grafts: Secondary | ICD-10-CM

## 2020-07-25 DIAGNOSIS — Z5111 Encounter for antineoplastic chemotherapy: Secondary | ICD-10-CM | POA: Insufficient documentation

## 2020-07-25 DIAGNOSIS — C770 Secondary and unspecified malignant neoplasm of lymph nodes of head, face and neck: Secondary | ICD-10-CM | POA: Diagnosis not present

## 2020-07-25 DIAGNOSIS — Z923 Personal history of irradiation: Secondary | ICD-10-CM | POA: Diagnosis not present

## 2020-07-25 DIAGNOSIS — I10 Essential (primary) hypertension: Secondary | ICD-10-CM | POA: Insufficient documentation

## 2020-07-25 DIAGNOSIS — C3492 Malignant neoplasm of unspecified part of left bronchus or lung: Secondary | ICD-10-CM

## 2020-07-25 DIAGNOSIS — M8580 Other specified disorders of bone density and structure, unspecified site: Secondary | ICD-10-CM | POA: Insufficient documentation

## 2020-07-25 DIAGNOSIS — Z510 Encounter for antineoplastic radiation therapy: Secondary | ICD-10-CM | POA: Diagnosis not present

## 2020-07-25 LAB — CMP (CANCER CENTER ONLY)
ALT: 14 U/L (ref 0–44)
AST: 18 U/L (ref 15–41)
Albumin: 3.5 g/dL (ref 3.5–5.0)
Alkaline Phosphatase: 56 U/L (ref 38–126)
Anion gap: 9 (ref 5–15)
BUN: 9 mg/dL (ref 8–23)
CO2: 24 mmol/L (ref 22–32)
Calcium: 8.8 mg/dL — ABNORMAL LOW (ref 8.9–10.3)
Chloride: 106 mmol/L (ref 98–111)
Creatinine: 0.8 mg/dL (ref 0.44–1.00)
GFR, Estimated: >60 mL/min (ref >60)
Glucose, Bld: 100 mg/dL — ABNORMAL HIGH (ref 70–99)
Potassium: 3.6 mmol/L (ref 3.5–5.1)
Sodium: 139 mmol/L (ref 135–145)
Total Bilirubin: 0.5 mg/dL (ref 0.3–1.2)
Total Protein: 6.8 g/dL (ref 6.5–8.1)

## 2020-07-25 LAB — CBC WITH DIFFERENTIAL (CANCER CENTER ONLY)
Abs Immature Granulocytes: 0.03 10*3/uL (ref 0.00–0.07)
Basophils Absolute: 0 10*3/uL (ref 0.0–0.1)
Basophils Relative: 0 %
Eosinophils Absolute: 0 10*3/uL (ref 0.0–0.5)
Eosinophils Relative: 0 %
HCT: 32.1 % — ABNORMAL LOW (ref 36.0–46.0)
Hemoglobin: 11 g/dL — ABNORMAL LOW (ref 12.0–15.0)
Immature Granulocytes: 1 %
Lymphocytes Relative: 15 %
Lymphs Abs: 0.4 10*3/uL — ABNORMAL LOW (ref 0.7–4.0)
MCH: 29.6 pg (ref 26.0–34.0)
MCHC: 34.3 g/dL (ref 30.0–36.0)
MCV: 86.3 fL (ref 80.0–100.0)
Monocytes Absolute: 0.3 10*3/uL (ref 0.1–1.0)
Monocytes Relative: 11 %
Neutro Abs: 1.9 10*3/uL (ref 1.7–7.7)
Neutrophils Relative %: 73 %
Platelet Count: 156 10*3/uL (ref 150–400)
RBC: 3.72 MIL/uL — ABNORMAL LOW (ref 3.87–5.11)
RDW: 13.2 % (ref 11.5–15.5)
WBC Count: 2.6 10*3/uL — ABNORMAL LOW (ref 4.0–10.5)
nRBC: 0 % (ref 0.0–0.2)

## 2020-07-25 MED ORDER — PALONOSETRON HCL INJECTION 0.25 MG/5ML
0.2500 mg | Freq: Once | INTRAVENOUS | Status: AC
  Administered 2020-07-25: 0.25 mg via INTRAVENOUS

## 2020-07-25 MED ORDER — SODIUM CHLORIDE 0.9% FLUSH
10.0000 mL | INTRAVENOUS | Status: DC | PRN
  Administered 2020-07-25: 10 mL
  Filled 2020-07-25: qty 10

## 2020-07-25 MED ORDER — SODIUM CHLORIDE 0.9 % IV SOLN
20.0000 mg | Freq: Once | INTRAVENOUS | Status: AC
  Administered 2020-07-25: 20 mg via INTRAVENOUS
  Filled 2020-07-25: qty 20

## 2020-07-25 MED ORDER — SODIUM CHLORIDE 0.9 % IV SOLN
158.0000 mg | Freq: Once | INTRAVENOUS | Status: AC
  Administered 2020-07-25: 160 mg via INTRAVENOUS
  Filled 2020-07-25: qty 16

## 2020-07-25 MED ORDER — SODIUM CHLORIDE 0.9% FLUSH
10.0000 mL | Freq: Once | INTRAVENOUS | Status: AC
  Administered 2020-07-25: 10 mL via INTRAVENOUS
  Filled 2020-07-25: qty 10

## 2020-07-25 MED ORDER — DIPHENHYDRAMINE HCL 50 MG/ML IJ SOLN
50.0000 mg | Freq: Once | INTRAMUSCULAR | Status: AC
  Administered 2020-07-25: 50 mg via INTRAVENOUS

## 2020-07-25 MED ORDER — SUCRALFATE 1 G PO TABS: 1.0000 g | ORAL_TABLET | Freq: Three times a day (TID) | ORAL | 1 refills | Status: DC

## 2020-07-25 MED ORDER — SODIUM CHLORIDE 0.9 % IV SOLN
45.0000 mg/m2 | Freq: Once | INTRAVENOUS | Status: AC
  Administered 2020-07-25: 84 mg via INTRAVENOUS
  Filled 2020-07-25: qty 14

## 2020-07-25 MED ORDER — HEPARIN SOD (PORK) LOCK FLUSH 100 UNIT/ML IV SOLN
500.0000 [IU] | Freq: Once | INTRAVENOUS | Status: AC | PRN
  Administered 2020-07-25: 500 [IU]
  Filled 2020-07-25: qty 5

## 2020-07-25 MED ORDER — FAMOTIDINE IN NACL 20-0.9 MG/50ML-% IV SOLN
INTRAVENOUS | Status: AC
  Filled 2020-07-25: qty 50

## 2020-07-25 MED ORDER — FAMOTIDINE IN NACL 20-0.9 MG/50ML-% IV SOLN
20.0000 mg | Freq: Once | INTRAVENOUS | Status: AC
  Administered 2020-07-25: 20 mg via INTRAVENOUS

## 2020-07-25 MED ORDER — SODIUM CHLORIDE 0.9 % IV SOLN
Freq: Once | INTRAVENOUS | Status: AC
  Filled 2020-07-25: qty 250

## 2020-07-25 MED ORDER — PALONOSETRON HCL INJECTION 0.25 MG/5ML
INTRAVENOUS | Status: AC
  Filled 2020-07-25: qty 5

## 2020-07-25 MED ORDER — DIPHENHYDRAMINE HCL 50 MG/ML IJ SOLN
INTRAMUSCULAR | Status: AC
  Filled 2020-07-25: qty 1

## 2020-07-25 NOTE — Progress Notes (Signed)
Per Dr [name] ,it is okay to treat pt today with Carboplatin and taxol and WBC of 2.6.

## 2020-07-25 NOTE — Progress Notes (Signed)
Spoke to patient and her daughter today.  Angelica [name] is doing well today.  I was given power of attorney papers. I made a copy of then and gave her daughter the original and placed the copy to be scanned bin.  I walked Angelica [name] to the blue room.

## 2020-07-25 NOTE — Progress Notes (Signed)
[facility] [phone]   [phone]  OFFICE PROGRESS NOTE  [name], MD [place] [address]  DIAGNOSIS: Stage IIIB (T2 a, N3, M0) non-small cell lung cancer, adenocarcinoma presented with left lower lobe lung mass in addition to left hilar and mediastinal as well as right and left cervical lymphadenopathy diagnosed in January 2022  PRIOR THERAPY: None  CURRENT THERAPY: Concurrent chemoradiation with weekly carboplatin for AUC of 2 and paclitaxel 45 mg/M2.  Status post 3 cycles.  INTERVAL HISTORY: Angelica [name] 80 y.o. female returns to the clinic today for follow-up visit accompanied by her daughter.  The patient is feeling fine today with no concerning complaints except for mild epigastric heartburn.  She denied having any current chest pain, shortness of breath, cough or hemoptysis.  She denied having any fever or chills.  She has no nausea, vomiting, diarrhea or constipation.  She has no headache or visual changes.  She denied having any weight loss or night sweats.  She continues to tolerate her course of concurrent chemoradiation fairly well.  She is here for evaluation before starting cycle #4.  MEDICAL HISTORY: Past Medical History:  Diagnosis Date  . Arthritis   . Asthma   . Bowel obstruction ([name])   . Environmental allergies   . GERD (gastroesophageal reflux disease)   . Glaucoma 05/09/2017  . H. pylori infection   . HLD (hyperlipidemia) 01/16/2019  . Hypertension   . Iron deficiency anemia   . Osteopenia 05/26/2017  . Thyroid disease   . Urticaria 07/25/2018    ALLERGIES:  is allergic to asa buff (mag [buffered aspirin], ensure [nutritional supplements], fish allergy, milk-related compounds, other, peanut-containing drug products, penicillins, shellfish-derived products, and strawberry extract.  MEDICATIONS:  Current Outpatient Medications  Medication Sig Dispense Refill  . acetaminophen (TYLENOL) 325  MG tablet Take 2 tablets (650 mg total) by mouth every 6 (six) hours as needed for headache. (Patient taking differently: Take 325 mg by mouth every 6 (six) hours as needed for headache or mild pain.)    . amLODipine (NORVASC) 10 MG tablet Take 1 tablet (10 mg total) by mouth daily. 90 tablet 0  . Benralizumab (FASENRA PEN) 30 MG/ML SOAJ Inject into the skin every 8 (eight) weeks.    . budesonide-formoterol (SYMBICORT) 80-4.5 MCG/ACT inhaler Inhale 2 puffs into the lungs 2 (two) times daily. Use for asthma flares 2 puffs twice a day for 2 weeks or until cough and wheeze free (Patient taking differently: Inhale 2 puffs into the lungs 2 (two) times daily as needed (for asthma flares, for 2 weeks, or until cough and wheeze-free).) 10.2 g 5  . Cholecalciferol (VITAMIN D3) 50 MCG (2000 UT) TABS Take 2,000 Units by mouth daily.    Marland Kitchen EPINEPHrine (EPIPEN 2-PAK) 0.3 mg/0.3 mL IJ SOAJ injection Use as directed for severe allergic reaction (Patient taking differently: Inject 0.3 mg into the muscle as needed for anaphylaxis.) 2 each 1  . ipratropium (ATROVENT) 0.06 % nasal spray Place 2 sprays into both nostrils 3 (three) times daily. (Patient taking differently: Place 1 spray into both nostrils 3 (three) times daily as needed (allergies.).) 15 mL 3  . linaclotide (LINZESS) 72 MCG capsule TAKE 1 CAPSULE BY MOUTH ONCE DAILY AS NEEDED (Patient taking differently: Take 72 mcg by mouth daily as needed (constipation).) 30 capsule 0  . loratadine (CLARITIN) 10 MG tablet Take 10 mg by mouth daily.    Marland Kitchen losartan (  COZAAR) 100 MG tablet Take 100 mg by mouth daily.    . Magnesium Hydroxide (PHILLIPS MILK OF MAGNESIA PO) Take 15-30 mLs by mouth daily as needed (for constipation).    . mometasone-formoterol (DULERA) 100-5 MCG/ACT AERO Inhale 1 puff into the lungs daily.    . pravastatin (PRAVACHOL) 80 MG tablet Take 1 tablet (80 mg total) by mouth daily. 90 tablet 3  . prochlorperazine (COMPAZINE) 10 MG tablet Take 1 tablet (10  mg total) by mouth every 6 (six) hours as needed for nausea or vomiting. 30 tablet 0  . telmisartan (MICARDIS) 80 MG tablet Take 1 tablet (80 mg total) by mouth daily. (Patient taking differently: Take 80 mg by mouth daily. Will take when losartan runs out ( has been on B/O)) 90 tablet 3  . traMADol (ULTRAM) 50 MG tablet Take 1 tablet (50 mg total) by mouth every 6 (six) hours as needed for moderate pain. 15 tablet 0  . vitamin B-12 (CYANOCOBALAMIN) 1000 MCG tablet Take 1,000 mcg by mouth daily.     Current Facility-Administered Medications  Medication Dose Route Frequency Provider Last Rate Last Admin  . Benralizumab SOSY 30 mg  30 mg Subcutaneous Q8 Weeks Kennith Gain, MD   30 mg at 07/05/20 1126    SURGICAL HISTORY:  Past Surgical History:  Procedure Laterality Date  . ABDOMINAL HYSTERECTOMY    . BOWEL RESECTION N/A 05/23/2020   Procedure: SMALL BOWEL REPAIR;  Surgeon: Kinsinger, Arta Bruce, MD;  Location: [facility];  Service: General;  Laterality: N/A;  . BREAST BIOPSY    . BRONCHIAL BIOPSY  06/10/2020   Procedure: BRONCHIAL BIOPSIES;  Surgeon: Garner Nash, DO;  Location: [facility];  Service: Pulmonary;;  . BRONCHIAL BRUSHINGS  06/10/2020   Procedure: BRONCHIAL BRUSHINGS;  Surgeon: Garner Nash, DO;  Location: [facility];  Service: Pulmonary;;  . BRONCHIAL NEEDLE ASPIRATION BIOPSY  06/10/2020   Procedure: BRONCHIAL NEEDLE ASPIRATION BIOPSIES;  Surgeon: Garner Nash, DO;  Location: [facility];  Service: Pulmonary;;  . BRONCHIAL WASHINGS  06/10/2020   Procedure: BRONCHIAL WASHINGS;  Surgeon: Garner Nash, DO;  Location: [facility];  Service: Pulmonary;;  . COLON SURGERY    . DIAGNOSTIC LARYNGOSCOPY N/A 05/23/2020   Procedure: ATTEMPTED DIAGNOSTIC LAPAROSCOPY WITH ADHESIONS;  Surgeon: Kieth Brightly, Arta Bruce, MD;  Location: [facility];  Service: General;  Laterality: N/A;  . IR IMAGING GUIDED PORT INSERTION  07/15/2020  . KNEE SURGERY    . LAPAROTOMY N/A 04/18/2015    Procedure: Exploratory laparotomy with lysis of adhesions, possible bowel resection;  Surgeon: Ralene Ok, MD;  Location: [facility];  Service: General;  Laterality: N/A;  . LAPAROTOMY N/A 05/23/2020   Procedure: EXPLORATORY LAPAROTOMY;  Surgeon: Kieth Brightly Arta Bruce, MD;  Location: [facility];  Service: General;  Laterality: N/A;  . LYSIS OF ADHESION N/A 05/23/2020   Procedure: LYSIS OF ADHESION;  Surgeon: Kieth Brightly Arta Bruce, MD;  Location: [facility];  Service: General;  Laterality: N/A;  . NASAL SINUS SURGERY    . THYROID SURGERY    . VIDEO BRONCHOSCOPY WITH ENDOBRONCHIAL NAVIGATION Bilateral 06/10/2020   Procedure: VIDEO BRONCHOSCOPY WITH ENDOBRONCHIAL NAVIGATION;  Surgeon: Garner Nash, DO;  Location: [facility];  Service: Pulmonary;  Laterality: Bilateral;  . VIDEO BRONCHOSCOPY WITH ENDOBRONCHIAL ULTRASOUND  06/10/2020   Procedure: VIDEO BRONCHOSCOPY WITH ENDOBRONCHIAL ULTRASOUND;  Surgeon: Garner Nash, DO;  Location: [facility];  Service: Pulmonary;;    REVIEW OF SYSTEMS:  A comprehensive review of systems was negative except  for: Gastrointestinal: positive for dyspepsia Neurological: positive for memory problems   PHYSICAL EXAMINATION: General appearance: alert, cooperative and no distress Head: Normocephalic, without obvious abnormality, atraumatic Neck: no adenopathy, no JVD, supple, symmetrical, trachea midline and thyroid not enlarged, symmetric, no tenderness/mass/nodules Lymph nodes: Cervical, supraclavicular, and axillary nodes normal. Resp: clear to auscultation bilaterally Back: symmetric, no curvature. ROM normal. No CVA tenderness. Cardio: regular rate and rhythm, S1, S2 normal, no murmur, click, rub or gallop GI: soft, non-tender; bowel sounds normal; no masses,  no organomegaly Extremities: extremities normal, atraumatic, no cyanosis or edema  ECOG PERFORMANCE STATUS: 1 - Symptomatic but completely ambulatory  Blood pressure 138/72, pulse 96, temperature 97.8 F  (36.6 C), temperature source Tympanic, resp. rate 14, height 5\' 5"  (1.651 m), weight 161 lb 11.2 oz (73.3 kg), SpO2 100 %.  LABORATORY DATA: Lab Results  Component Value Date   WBC 2.6 (L) 07/25/2020   HGB 11.0 (L) 07/25/2020   HCT 32.1 (L) 07/25/2020   MCV 86.3 07/25/2020   PLT 156 07/25/2020      Chemistry      Component Value Date/Time   NA 139 07/18/2020 0856   K 3.1 (L) 07/18/2020 0856   CL 107 07/18/2020 0856   CO2 22 07/18/2020 0856   BUN 11 07/18/2020 0856   CREATININE 0.71 07/18/2020 0856      Component Value Date/Time   CALCIUM 8.8 (L) 07/18/2020 0856   ALKPHOS 64 07/18/2020 0856   AST 18 07/18/2020 0856   ALT 12 07/18/2020 0856   BILITOT 0.6 07/18/2020 0856       RADIOGRAPHIC STUDIES: IR IMAGING GUIDED PORT INSERTION  Result Date: 07/15/2020 INDICATION: 80 year old with lung cancer and poor venous access. Port-A-Cath needed for treatment. EXAM: FLUOROSCOPIC AND ULTRASOUND GUIDED PLACEMENT OF A SUBCUTANEOUS PORT COMPARISON:  None. MEDICATIONS: Moderate sedation ANESTHESIA/SEDATION: Versed 1.0 mg IV; Fentanyl 50 mcg IV; Moderate Sedation Time:  31 minutes The patient was continuously monitored during the procedure by the interventional radiology nurse under my direct supervision. FLUOROSCOPY TIME:  18 seconds, 1 mGy COMPLICATIONS: None immediate. PROCEDURE: The procedure, risks, benefits, and alternatives were explained to the patient. Questions regarding the procedure were encouraged and answered. The patient understands and consents to the procedure. Patient was placed supine on the interventional table. Ultrasound confirmed a patent right internal jugular vein. Ultrasound image was saved for documentation. The right chest and neck were cleaned with a skin antiseptic and a sterile drape was placed. Maximal barrier sterile technique was utilized including caps, mask, sterile gowns, sterile gloves, sterile drape, hand hygiene and skin antiseptic. The right neck was  anesthetized with 1% lidocaine. Small incision was made in the right neck with a blade. Micropuncture set was placed in the right internal jugular vein with ultrasound guidance. The micropuncture wire was used for measurement purposes. The right chest was anesthetized with 1% lidocaine with epinephrine. #15 blade was used to make an incision and a subcutaneous port pocket was formed. [place] was assembled. Subcutaneous tunnel was formed with a stiff tunneling device. The port catheter was brought through the subcutaneous tunnel. The port was placed in the subcutaneous pocket. The micropuncture set was exchanged for a peel-away sheath. The catheter was placed through the peel-away sheath and the tip was positioned at the superior cavoatrial junction. Catheter placement was confirmed with fluoroscopy. The port was accessed and flushed with heparinized saline. The port pocket was closed using two layers of absorbable sutures and Dermabond. The vein skin site  was closed using a single layer of absorbable suture and Dermabond. Sterile dressings were applied. Patient tolerated the procedure well without an immediate complication. Ultrasound and fluoroscopic images were taken and saved for this procedure. IMPRESSION: Placement of a subcutaneous port device. Catheter tip at the superior cavoatrial junction. Electronically Signed   By: Markus Daft M.D.   On: 07/15/2020 17:26    ASSESSMENT AND PLAN: This is a very pleasant 80 years old African-American female recently diagnosed with a stage IIb (T2a, N3, M0) non-small cell lung cancer, adenocarcinoma presented with left lower lobe lung mass in addition to left hilar and mediastinal lymphadenopathy as well as right and left cervical lymphadenopathy diagnosed in January 2022. The patient is currently undergoing a course of concurrent chemoradiation with weekly carboplatin and paclitaxel status post 3 cycles.   The patient has been tolerating her treatment well  with no concerning adverse effects. I recommended for her to proceed with cycle #4 of her chemotherapy today as planned. I will see her back for follow-up visit in 2 weeks for evaluation before starting cycle #6. For the leukocytopenia, we will continue to monitor closely but the patient was advised to call immediately if she has any fever or chills in the interval. The patient voices understanding of current disease status and treatment options and is in agreement with the current care plan.  All questions were answered. The patient knows to call the clinic with any problems, questions or concerns. We can certainly see the patient much sooner if necessary.  Disclaimer: This note was dictated with voice recognition software. Similar sounding words can inadvertently be transcribed and may not be corrected upon review.

## 2020-07-25 NOTE — Patient Instructions (Signed)

## 2020-07-25 NOTE — Patient Instructions (Signed)
   [facility] Discharge Instructions for Patients Receiving Chemotherapy  Today you received the following chemotherapy agents Taxol and Carboplatin   To help prevent nausea and vomiting after your treatment, we encourage you to take your nausea medication as directed.    If you develop nausea and vomiting that is not controlled by your nausea medication, call the clinic.   BELOW ARE SYMPTOMS THAT SHOULD BE REPORTED IMMEDIATELY:  *FEVER GREATER THAN 100.5 F  *CHILLS WITH OR WITHOUT FEVER  NAUSEA AND VOMITING THAT IS NOT CONTROLLED WITH YOUR NAUSEA MEDICATION  *UNUSUAL SHORTNESS OF BREATH  *UNUSUAL BRUISING OR BLEEDING  TENDERNESS IN MOUTH AND THROAT WITH OR WITHOUT PRESENCE OF ULCERS  *URINARY PROBLEMS  *BOWEL PROBLEMS  UNUSUAL RASH Items with * indicate a potential emergency and should be followed up as soon as possible.  Feel free to call the clinic should you have any questions or concerns. The clinic phone number is [phone].  Please show the CHEMO ALERT CARD at check-in to the Emergency Department and triage nurse.   

## 2020-07-26 ENCOUNTER — Other Ambulatory Visit: Payer: Self-pay

## 2020-07-26 ENCOUNTER — Ambulatory Visit
Admission: RE | Admit: 2020-07-26 | Discharge: 2020-07-26 | Disposition: A | Discharge status: Home / Self Care | Payer: Medicare HMO | Source: Ambulatory Visit | Attending: Radiation Oncology | Admitting: Radiation Oncology

## 2020-07-26 DIAGNOSIS — E049 Nontoxic goiter, unspecified: Secondary | ICD-10-CM | POA: Diagnosis not present

## 2020-07-26 DIAGNOSIS — J4550 Severe persistent asthma, uncomplicated: Secondary | ICD-10-CM | POA: Diagnosis not present

## 2020-07-26 DIAGNOSIS — M170 Bilateral primary osteoarthritis of knee: Secondary | ICD-10-CM | POA: Diagnosis not present

## 2020-07-26 DIAGNOSIS — Z510 Encounter for antineoplastic radiation therapy: Secondary | ICD-10-CM | POA: Diagnosis not present

## 2020-07-26 DIAGNOSIS — Z87891 Personal history of nicotine dependence: Secondary | ICD-10-CM | POA: Diagnosis not present

## 2020-07-26 DIAGNOSIS — M19041 Primary osteoarthritis, right hand: Secondary | ICD-10-CM | POA: Diagnosis not present

## 2020-07-26 DIAGNOSIS — Z48815 Encounter for surgical aftercare following surgery on the digestive system: Secondary | ICD-10-CM | POA: Diagnosis not present

## 2020-07-26 DIAGNOSIS — C3432 Malignant neoplasm of lower lobe, left bronchus or lung: Secondary | ICD-10-CM | POA: Diagnosis not present

## 2020-07-26 DIAGNOSIS — R918 Other nonspecific abnormal finding of lung field: Secondary | ICD-10-CM | POA: Diagnosis not present

## 2020-07-26 DIAGNOSIS — U071 COVID-19: Secondary | ICD-10-CM | POA: Diagnosis not present

## 2020-07-26 DIAGNOSIS — I10 Essential (primary) hypertension: Secondary | ICD-10-CM | POA: Diagnosis not present

## 2020-07-26 DIAGNOSIS — K565 Intestinal adhesions [bands], unspecified as to partial versus complete obstruction: Secondary | ICD-10-CM | POA: Diagnosis not present

## 2020-07-27 ENCOUNTER — Other Ambulatory Visit: Payer: Self-pay

## 2020-07-27 ENCOUNTER — Ambulatory Visit
Admission: RE | Admit: 2020-07-27 | Discharge: 2020-07-27 | Disposition: A | Discharge status: Home / Self Care | Payer: Medicare HMO | Source: Ambulatory Visit | Attending: Radiation Oncology | Admitting: Radiation Oncology

## 2020-07-27 DIAGNOSIS — Z87891 Personal history of nicotine dependence: Secondary | ICD-10-CM | POA: Diagnosis not present

## 2020-07-27 DIAGNOSIS — R918 Other nonspecific abnormal finding of lung field: Secondary | ICD-10-CM | POA: Diagnosis not present

## 2020-07-27 DIAGNOSIS — Z510 Encounter for antineoplastic radiation therapy: Secondary | ICD-10-CM | POA: Diagnosis not present

## 2020-07-27 DIAGNOSIS — C3432 Malignant neoplasm of lower lobe, left bronchus or lung: Secondary | ICD-10-CM | POA: Diagnosis not present

## 2020-07-27 DIAGNOSIS — J4550 Severe persistent asthma, uncomplicated: Secondary | ICD-10-CM | POA: Diagnosis not present

## 2020-07-27 DIAGNOSIS — M19041 Primary osteoarthritis, right hand: Secondary | ICD-10-CM | POA: Diagnosis not present

## 2020-07-27 DIAGNOSIS — U071 COVID-19: Secondary | ICD-10-CM | POA: Diagnosis not present

## 2020-07-27 DIAGNOSIS — K565 Intestinal adhesions [bands], unspecified as to partial versus complete obstruction: Secondary | ICD-10-CM | POA: Diagnosis not present

## 2020-07-27 DIAGNOSIS — M170 Bilateral primary osteoarthritis of knee: Secondary | ICD-10-CM | POA: Diagnosis not present

## 2020-07-27 DIAGNOSIS — Z48815 Encounter for surgical aftercare following surgery on the digestive system: Secondary | ICD-10-CM | POA: Diagnosis not present

## 2020-07-27 DIAGNOSIS — E049 Nontoxic goiter, unspecified: Secondary | ICD-10-CM | POA: Diagnosis not present

## 2020-07-27 DIAGNOSIS — I10 Essential (primary) hypertension: Secondary | ICD-10-CM | POA: Diagnosis not present

## 2020-07-28 ENCOUNTER — Ambulatory Visit: Payer: Medicare HMO | Admitting: Allergy

## 2020-07-28 ENCOUNTER — Ambulatory Visit
Admission: RE | Admit: 2020-07-28 | Discharge: 2020-07-28 | Disposition: A | Discharge status: Home / Self Care | Payer: Medicare HMO | Source: Ambulatory Visit | Attending: Radiation Oncology | Admitting: Radiation Oncology

## 2020-07-28 ENCOUNTER — Encounter: Payer: Self-pay | Admitting: Allergy

## 2020-07-28 ENCOUNTER — Other Ambulatory Visit: Payer: Self-pay

## 2020-07-28 VITALS — BP 124/80 | HR 92 | Temp 97.7°F

## 2020-07-28 DIAGNOSIS — K219 Gastro-esophageal reflux disease without esophagitis: Secondary | ICD-10-CM

## 2020-07-28 DIAGNOSIS — J339 Nasal polyp, unspecified: Secondary | ICD-10-CM | POA: Diagnosis not present

## 2020-07-28 DIAGNOSIS — C3432 Malignant neoplasm of lower lobe, left bronchus or lung: Secondary | ICD-10-CM | POA: Diagnosis not present

## 2020-07-28 DIAGNOSIS — J3089 Other allergic rhinitis: Secondary | ICD-10-CM | POA: Diagnosis not present

## 2020-07-28 DIAGNOSIS — Z87891 Personal history of nicotine dependence: Secondary | ICD-10-CM | POA: Diagnosis not present

## 2020-07-28 DIAGNOSIS — J4550 Severe persistent asthma, uncomplicated: Secondary | ICD-10-CM

## 2020-07-28 DIAGNOSIS — Z510 Encounter for antineoplastic radiation therapy: Secondary | ICD-10-CM | POA: Diagnosis not present

## 2020-07-28 DIAGNOSIS — L509 Urticaria, unspecified: Secondary | ICD-10-CM

## 2020-07-28 MED ORDER — MOMETASONE FURO-FORMOTEROL FUM 100-5 MCG/ACT IN AERO: 2.0000 | INHALATION_SPRAY | Freq: Two times a day (BID) | RESPIRATORY_TRACT | 2 refills | Status: DC

## 2020-07-28 MED ORDER — EPINEPHRINE 0.3 MG/0.3ML IJ SOAJ: 0.3000 mg | Freq: Once | INTRAMUSCULAR | 0 refills | Status: AC

## 2020-07-28 MED ORDER — IPRATROPIUM BROMIDE 0.06 % NA SOLN: 2.0000 | Freq: Three times a day (TID) | NASAL | 3 refills | Status: DC

## 2020-07-28 NOTE — Patient Instructions (Addendum)
  1. Use Dulera or Symbicort 2 puffs twice a day for asthma maintenance control  2. Continue Fasenra injections every 8 weeks.     3. Use Ipratropium 0.06% 2 sprays each nostril in morning and at bedtime to help with nasal drainage control   4.  If needed:   A.  Ventolin HFA 2 puffs or albuterol nebulization every 4-6 hours  B.  OTC antihistamine   C. Nasal saline spray to help moisturize nose     5. Return to clinic in 6 months or earlier if problem

## 2020-07-28 NOTE — Progress Notes (Signed)
Follow-up Note  RE: Angelica [name] MRN: [id number] DOB: 05/21/1941 Date of Office Visit: 07/28/2020   History of present illness: Angelica [name] is a 80 y.o. female presenting today for follow-up of asthma, allergic rhinitis with nasal polyposis, LPRD and urticaria history.  She was last seen in the office on 01/28/2020 by myself.  She presents today with her daughter.  She had a hospitalization after the last visit for small bowel obstruction with incidental Covid infection from 1228 2 06/03/2020.  She did receive monoclonal antibody infusion for a Covid illness.  During the hospitalization she had incidental finding of a left lung mass.  Her symptoms did improve from the small bowel obstruction and she was able to be discharged where she saw pulmonary outpatient for the lung mass.  She had tissue biopsy performed in bronchoscopy and was diagnosed with stage IIIb non-small cell adenocarcinoma.  She is now following with oncology and radiation oncology.  She is currently undergoing chemotherapy treatment and radiation. She has lost weight but overall she states she is feeling okay.  Some fatigue.  In regards to her asthma she states that she has not had any symptoms that she has not required use of her albuterol.  She is no longer taking Singulair.  She states while she was in the hospital she was on [facility] per formulary and felt like it was working even better than the Symbicort had been.  She states she was provided with a sample after her hospital which she has continued to use.  She would like to continue [facility] if possible.  She is on Fasenra injections every 8 weeks.  It has been discussed between her oncologist as well as the radiation oncologist in regards to continuation of Berna Bue and everyone is on board to continue this to maintain her asthma control. She states she will use the nasal ipratropium as needed for nasal drainage and wants a refill of this.  She does use the Flonase most days of  the week.  She is no longer taking omeprazole at this time.    Review of systems: Review of Systems  Constitutional: Positive for malaise/fatigue.  HENT: Negative.   Eyes: Negative.   Respiratory: Negative.   Cardiovascular: Negative.   Gastrointestinal: Negative.   Musculoskeletal: Negative.   Skin: Negative.   Neurological: Negative.     All other systems negative unless noted above in HPI  Past medical/social/surgical/family history have been reviewed and are unchanged unless specifically indicated below.  No changes  Medication List: Current Outpatient Medications  Medication Sig Dispense Refill  . acetaminophen (TYLENOL) 325 MG tablet Take 2 tablets (650 mg total) by mouth every 6 (six) hours as needed for headache. (Patient taking differently: Take 325 mg by mouth every 6 (six) hours as needed for headache or mild pain.)    . amLODipine (NORVASC) 10 MG tablet Take 1 tablet (10 mg total) by mouth daily. 90 tablet 0  . Benralizumab (FASENRA PEN) 30 MG/ML SOAJ Inject into the skin every 8 (eight) weeks.    . budesonide-formoterol (SYMBICORT) 80-4.5 MCG/ACT inhaler Inhale 2 puffs into the lungs 2 (two) times daily. Use for asthma flares 2 puffs twice a day for 2 weeks or until cough and wheeze free (Patient taking differently: Inhale 2 puffs into the lungs 2 (two) times daily as needed (for asthma flares, for 2 weeks, or until cough and wheeze-free).) 10.2 g 5  . Cholecalciferol (VITAMIN D3) 50 MCG (2000 UT) TABS Take 2,000  Units by mouth daily.    Marland Kitchen EPINEPHrine (EPIPEN 2-PAK) 0.3 mg/0.3 mL IJ SOAJ injection Use as directed for severe allergic reaction (Patient taking differently: Inject 0.3 mg into the muscle as needed for anaphylaxis.) 2 each 1  . EPINEPHrine 0.3 mg/0.3 mL IJ SOAJ injection Inject 0.3 mg into the muscle once for 1 dose. 0.3 mL 0  . linaclotide (LINZESS) 72 MCG capsule TAKE 1 CAPSULE BY MOUTH ONCE DAILY AS NEEDED (Patient taking differently: Take 72 mcg by mouth  daily as needed (constipation).) 30 capsule 0  . loratadine (CLARITIN) 10 MG tablet Take 10 mg by mouth daily.    Marland Kitchen losartan (COZAAR) 100 MG tablet Take 100 mg by mouth daily.    . Magnesium Hydroxide (PHILLIPS MILK OF MAGNESIA PO) Take 15-30 mLs by mouth daily as needed (for constipation).    . mometasone-formoterol (DULERA) 100-5 MCG/ACT AERO Inhale 1 puff into the lungs daily.    . mometasone-formoterol (DULERA) 100-5 MCG/ACT AERO Inhale 2 puffs into the lungs in the morning and at bedtime. 13 g 2  . pravastatin (PRAVACHOL) 80 MG tablet Take 1 tablet (80 mg total) by mouth daily. 90 tablet 3  . prochlorperazine (COMPAZINE) 10 MG tablet Take 1 tablet (10 mg total) by mouth every 6 (six) hours as needed for nausea or vomiting. 30 tablet 0  . sucralfate (CARAFATE) 1 g tablet Take 1 tablet (1 g total) by mouth 4 (four) times daily -  with meals and at bedtime. Crush and dissolve in 10 mL of warm water prior to swallowing 120 tablet 1  . telmisartan (MICARDIS) 80 MG tablet Take 1 tablet (80 mg total) by mouth daily. (Patient taking differently: Take 80 mg by mouth daily. Will take when losartan runs out ( has been on B/O)) 90 tablet 3  . traMADol (ULTRAM) 50 MG tablet Take 1 tablet (50 mg total) by mouth every 6 (six) hours as needed for moderate pain. 15 tablet 0  . vitamin B-12 (CYANOCOBALAMIN) 1000 MCG tablet Take 1,000 mcg by mouth daily.    Marland Kitchen ipratropium (ATROVENT) 0.06 % nasal spray Place 2 sprays into both nostrils 3 (three) times daily. 15 mL 3   Current Facility-Administered Medications  Medication Dose Route Frequency Provider Last Rate Last Admin  . Benralizumab SOSY 30 mg  30 mg Subcutaneous Q8 Weeks Kennith Gain, MD   30 mg at 07/05/20 1126     Known medication allergies: Allergies  Allergen Reactions  . Asa Buff (Mag [Buffered Aspirin] Other (See Comments)    Excessive sweating  . Ensure [Nutritional Supplements]     Or boost - upset stomach   . Fish Allergy Other  (See Comments)    Unknown reaction, but allergic  . Milk-Related Compounds     Doesn't drink milk but can eat cheese   . Other Other (See Comments)    Gelcaps; gel-containing capsules; extended-release medication - upset stomach   . Peanut-Containing Drug Products Other (See Comments)    From allergy test  . Penicillins Itching    Has patient had a PCN reaction causing immediate rash, facial/tongue/throat swelling, SOB or lightheadedness with hypotension: No Has patient had a PCN reaction causing severe rash involving mucus membranes or skin necrosis: No Has patient had a PCN reaction that required hospitalization No Has patient had a PCN reaction occurring within the last 10 years: No If all of the above answers are "NO", then may proceed with Cephalosporin use.  Gardiner Fanti Products Other (See Comments)  From allergy test  . Strawberry Extract Other (See Comments)    From allergy test      Physical examination: Blood pressure 124/80, pulse 92, temperature 97.7 F (36.5 C), SpO2 100 %.  General: Alert, interactive, in no acute distress. HEENT: PERRLA, TMs pearly gray, turbinates non-edematous without discharge, post-pharynx non erythematous. Neck: Supple without lymphadenopathy. Lungs: Clear to auscultation without wheezing, rhonchi or rales. {no increased work of breathing. CV: Normal S1, S2 without murmurs. Abdomen: Nondistended, nontender. Skin: Warm and dry, without lesions or rashes. Extremities:  No clubbing, cyanosis or edema. Neuro:   Grossly intact.  Diagnositics/Labs: ACT score 24  Assessment and plan: Severe persistent asthma -at this time she remains under good control on her ICS/LABA regimen as well as biologic agent Fasenra LPRD -asymptomatic at this time off antireflux medication Allergic rhinitis -well-controlled at this time with the use of as needed nasal ipratropium Nasal polyposis -controlled with the use of nasal steroid spray Urticaria -has  not had any further episodes    1. Use Dulera or Symbicort 2 puffs twice a day for asthma maintenance control  2. Continue Fasenra injections every 8 weeks.     3. Use Ipratropium 0.06% 2 sprays each nostril in morning and at bedtime to help with nasal drainage control   4.  If needed:   A.  Ventolin HFA 2 puffs or albuterol nebulization every 4-6 hours  B.  OTC antihistamine   C. Nasal saline spray to help moisturize nose     5. Return to clinic in 6 months or earlier if problem  I appreciate the opportunity to take part in Trenise's care. Please do not hesitate to contact me with questions.  Sincerely,   Prudy Feeler, MD Allergy/Immunology [facility]

## 2020-07-29 ENCOUNTER — Ambulatory Visit
Admission: RE | Admit: 2020-07-29 | Discharge: 2020-07-29 | Disposition: A | Discharge status: Home / Self Care | Payer: Medicare HMO | Source: Ambulatory Visit | Attending: Radiation Oncology | Admitting: Radiation Oncology

## 2020-07-29 ENCOUNTER — Other Ambulatory Visit: Payer: Self-pay

## 2020-07-29 DIAGNOSIS — M19041 Primary osteoarthritis, right hand: Secondary | ICD-10-CM | POA: Diagnosis not present

## 2020-07-29 DIAGNOSIS — I10 Essential (primary) hypertension: Secondary | ICD-10-CM | POA: Diagnosis not present

## 2020-07-29 DIAGNOSIS — Z87891 Personal history of nicotine dependence: Secondary | ICD-10-CM | POA: Diagnosis not present

## 2020-07-29 DIAGNOSIS — Z510 Encounter for antineoplastic radiation therapy: Secondary | ICD-10-CM | POA: Diagnosis not present

## 2020-07-29 DIAGNOSIS — R918 Other nonspecific abnormal finding of lung field: Secondary | ICD-10-CM | POA: Diagnosis not present

## 2020-07-29 DIAGNOSIS — Z48815 Encounter for surgical aftercare following surgery on the digestive system: Secondary | ICD-10-CM | POA: Diagnosis not present

## 2020-07-29 DIAGNOSIS — K565 Intestinal adhesions [bands], unspecified as to partial versus complete obstruction: Secondary | ICD-10-CM | POA: Diagnosis not present

## 2020-07-29 DIAGNOSIS — C3432 Malignant neoplasm of lower lobe, left bronchus or lung: Secondary | ICD-10-CM | POA: Diagnosis not present

## 2020-07-29 DIAGNOSIS — U071 COVID-19: Secondary | ICD-10-CM | POA: Diagnosis not present

## 2020-07-29 DIAGNOSIS — E049 Nontoxic goiter, unspecified: Secondary | ICD-10-CM | POA: Diagnosis not present

## 2020-07-29 DIAGNOSIS — M170 Bilateral primary osteoarthritis of knee: Secondary | ICD-10-CM | POA: Diagnosis not present

## 2020-07-29 DIAGNOSIS — J4550 Severe persistent asthma, uncomplicated: Secondary | ICD-10-CM | POA: Diagnosis not present

## 2020-08-01 ENCOUNTER — Telehealth: Payer: Self-pay | Admitting: Internal Medicine

## 2020-08-01 ENCOUNTER — Inpatient Hospital Stay: Payer: Medicare HMO

## 2020-08-01 ENCOUNTER — Other Ambulatory Visit: Payer: Self-pay | Admitting: *Deleted

## 2020-08-01 ENCOUNTER — Ambulatory Visit
Admission: RE | Admit: 2020-08-01 | Discharge: 2020-08-01 | Disposition: A | Discharge status: Home / Self Care | Payer: Medicare HMO | Source: Ambulatory Visit | Attending: Radiation Oncology | Admitting: Radiation Oncology

## 2020-08-01 ENCOUNTER — Telehealth: Payer: Self-pay | Admitting: Allergy

## 2020-08-01 ENCOUNTER — Other Ambulatory Visit: Payer: Self-pay

## 2020-08-01 VITALS — BP 124/69 | HR 90 | Temp 98.2°F | Resp 18 | Wt 161.5 lb

## 2020-08-01 DIAGNOSIS — C770 Secondary and unspecified malignant neoplasm of lymph nodes of head, face and neck: Secondary | ICD-10-CM | POA: Diagnosis not present

## 2020-08-01 DIAGNOSIS — E785 Hyperlipidemia, unspecified: Secondary | ICD-10-CM | POA: Diagnosis not present

## 2020-08-01 DIAGNOSIS — C3432 Malignant neoplasm of lower lobe, left bronchus or lung: Secondary | ICD-10-CM | POA: Diagnosis not present

## 2020-08-01 DIAGNOSIS — Z5111 Encounter for antineoplastic chemotherapy: Secondary | ICD-10-CM | POA: Diagnosis not present

## 2020-08-01 DIAGNOSIS — M8580 Other specified disorders of bone density and structure, unspecified site: Secondary | ICD-10-CM | POA: Diagnosis not present

## 2020-08-01 DIAGNOSIS — C781 Secondary malignant neoplasm of mediastinum: Secondary | ICD-10-CM | POA: Diagnosis not present

## 2020-08-01 DIAGNOSIS — Z95828 Presence of other vascular implants and grafts: Secondary | ICD-10-CM

## 2020-08-01 DIAGNOSIS — Z510 Encounter for antineoplastic radiation therapy: Secondary | ICD-10-CM | POA: Diagnosis not present

## 2020-08-01 DIAGNOSIS — Z923 Personal history of irradiation: Secondary | ICD-10-CM | POA: Diagnosis not present

## 2020-08-01 DIAGNOSIS — C3492 Malignant neoplasm of unspecified part of left bronchus or lung: Secondary | ICD-10-CM

## 2020-08-01 DIAGNOSIS — K219 Gastro-esophageal reflux disease without esophagitis: Secondary | ICD-10-CM | POA: Diagnosis not present

## 2020-08-01 DIAGNOSIS — Z87891 Personal history of nicotine dependence: Secondary | ICD-10-CM | POA: Diagnosis not present

## 2020-08-01 DIAGNOSIS — I10 Essential (primary) hypertension: Secondary | ICD-10-CM | POA: Diagnosis not present

## 2020-08-01 LAB — CBC WITH DIFFERENTIAL (CANCER CENTER ONLY)
Abs Immature Granulocytes: 0.01 10*3/uL (ref 0.00–0.07)
Basophils Absolute: 0 10*3/uL (ref 0.0–0.1)
Basophils Relative: 0 %
Eosinophils Absolute: 0 10*3/uL (ref 0.0–0.5)
Eosinophils Relative: 0 %
HCT: 31.3 % — ABNORMAL LOW (ref 36.0–46.0)
Hemoglobin: 10.7 g/dL — ABNORMAL LOW (ref 12.0–15.0)
Immature Granulocytes: 0 %
Lymphocytes Relative: 13 %
Lymphs Abs: 0.3 10*3/uL — ABNORMAL LOW (ref 0.7–4.0)
MCH: 29.8 pg (ref 26.0–34.0)
MCHC: 34.2 g/dL (ref 30.0–36.0)
MCV: 87.2 fL (ref 80.0–100.0)
Monocytes Absolute: 0.2 10*3/uL (ref 0.1–1.0)
Monocytes Relative: 8 %
Neutro Abs: 1.9 10*3/uL (ref 1.7–7.7)
Neutrophils Relative %: 79 %
Platelet Count: 116 10*3/uL — ABNORMAL LOW (ref 150–400)
RBC: 3.59 MIL/uL — ABNORMAL LOW (ref 3.87–5.11)
RDW: 13.6 % (ref 11.5–15.5)
WBC Count: 2.4 10*3/uL — ABNORMAL LOW (ref 4.0–10.5)
nRBC: 0 % (ref 0.0–0.2)

## 2020-08-01 LAB — CMP (CANCER CENTER ONLY)
ALT: 18 U/L (ref 0–44)
AST: 22 U/L (ref 15–41)
Albumin: 3.5 g/dL (ref 3.5–5.0)
Alkaline Phosphatase: 62 U/L (ref 38–126)
Anion gap: 11 (ref 5–15)
BUN: 9 mg/dL (ref 8–23)
CO2: 23 mmol/L (ref 22–32)
Calcium: 8.9 mg/dL (ref 8.9–10.3)
Chloride: 104 mmol/L (ref 98–111)
Creatinine: 0.73 mg/dL (ref 0.44–1.00)
GFR, Estimated: >60 mL/min (ref >60)
Glucose, Bld: 99 mg/dL (ref 70–99)
Potassium: 3.4 mmol/L — ABNORMAL LOW (ref 3.5–5.1)
Sodium: 138 mmol/L (ref 135–145)
Total Bilirubin: 0.5 mg/dL (ref 0.3–1.2)
Total Protein: 6.7 g/dL (ref 6.5–8.1)

## 2020-08-01 MED ORDER — SODIUM CHLORIDE 0.9 % IV SOLN
158.0000 mg | Freq: Once | INTRAVENOUS | Status: AC
Start: 2020-08-01 — End: 2020-08-01
  Administered 2020-08-01: 160 mg via INTRAVENOUS
  Filled 2020-08-01: qty 16

## 2020-08-01 MED ORDER — HEPARIN SOD (PORK) LOCK FLUSH 100 UNIT/ML IV SOLN
500.0000 [IU] | Freq: Once | INTRAVENOUS | Status: AC | PRN
  Administered 2020-08-01: 500 [IU]
  Filled 2020-08-01: qty 5

## 2020-08-01 MED ORDER — SODIUM CHLORIDE 0.9% FLUSH
10.0000 mL | INTRAVENOUS | Status: DC | PRN
  Administered 2020-08-01: 10 mL
  Filled 2020-08-01: qty 10

## 2020-08-01 MED ORDER — SODIUM CHLORIDE 0.9 % IV SOLN
Freq: Once | INTRAVENOUS | Status: AC
  Filled 2020-08-01: qty 250

## 2020-08-01 MED ORDER — FAMOTIDINE IN NACL 20-0.9 MG/50ML-% IV SOLN
20.0000 mg | Freq: Once | INTRAVENOUS | Status: AC
  Administered 2020-08-01: 20 mg via INTRAVENOUS

## 2020-08-01 MED ORDER — DIPHENHYDRAMINE HCL 50 MG/ML IJ SOLN
INTRAMUSCULAR | Status: AC
  Filled 2020-08-01: qty 1

## 2020-08-01 MED ORDER — DEXAMETHASONE SODIUM PHOSPHATE 100 MG/10ML IJ SOLN
20.0000 mg | Freq: Once | INTRAMUSCULAR | Status: AC
  Administered 2020-08-01: 20 mg via INTRAVENOUS
  Filled 2020-08-01: qty 20

## 2020-08-01 MED ORDER — PALONOSETRON HCL INJECTION 0.25 MG/5ML
INTRAVENOUS | Status: AC
  Filled 2020-08-01: qty 5

## 2020-08-01 MED ORDER — PALONOSETRON HCL INJECTION 0.25 MG/5ML
0.2500 mg | Freq: Once | INTRAVENOUS | Status: AC
  Administered 2020-08-01: 0.25 mg via INTRAVENOUS

## 2020-08-01 MED ORDER — SODIUM CHLORIDE 0.9 % IV SOLN
45.0000 mg/m2 | Freq: Once | INTRAVENOUS | Status: AC
  Administered 2020-08-01: 84 mg via INTRAVENOUS
  Filled 2020-08-01: qty 14

## 2020-08-01 MED ORDER — DIPHENHYDRAMINE HCL 50 MG/ML IJ SOLN
50.0000 mg | Freq: Once | INTRAMUSCULAR | Status: AC
  Administered 2020-08-01: 50 mg via INTRAVENOUS

## 2020-08-01 MED ORDER — FAMOTIDINE IN NACL 20-0.9 MG/50ML-% IV SOLN
INTRAVENOUS | Status: AC
  Filled 2020-08-01: qty 50

## 2020-08-01 MED ORDER — TRAMADOL HCL 50 MG PO TABS: 50.0000 mg | ORAL_TABLET | Freq: Four times a day (QID) | ORAL | 2 refills | Status: DC | PRN

## 2020-08-01 MED ORDER — SODIUM CHLORIDE 0.9% FLUSH
10.0000 mL | Freq: Once | INTRAVENOUS | Status: AC
  Administered 2020-08-01: 10 mL via INTRAVENOUS
  Filled 2020-08-01: qty 10

## 2020-08-01 MED ORDER — EPINEPHRINE 0.3 MG/0.3ML IJ SOAJ: 0.3000 mg | Freq: Once | INTRAMUSCULAR | 2 refills | Status: AC

## 2020-08-01 NOTE — Telephone Encounter (Signed)
Pharmacy notified dx code M54.50

## 2020-08-01 NOTE — Telephone Encounter (Signed)
Jasmine from [employer] called needing clarification on prescription for Epi Pen. States Epi Pen was called in and not Auvi-Q. Call back number is [phone].  Please advise.

## 2020-08-01 NOTE — Telephone Encounter (Signed)
Correct prescription for Cammie Sickle has been sent in to [place].

## 2020-08-01 NOTE — Telephone Encounter (Signed)
1.Medication Requested: traMADol (ULTRAM) 50 MG tablet    2. Pharmacy (Name, Street, [place]): [place]  3. On Med List: yes  4. Last Visit with PCP: 2.10.22  5. Next visit date with PCP: 6.14.22  Patients daughter said that she has one pill left of the medication.    Agent: Please be advised that RX refills may take up to 3 business days. We ask that you follow-up with your pharmacy.

## 2020-08-01 NOTE — Telephone Encounter (Signed)
[place] called and is requesting a diagnosis code for traMADol (ULTRAM) 50 MG tablet.    Phone: [phone]

## 2020-08-01 NOTE — Patient Instructions (Signed)
[facility] Discharge Instructions for Patients Receiving Chemotherapy  Today you received the following chemotherapy agents: paclitaxel/carboplatin.  To help prevent nausea and vomiting after your treatment, we encourage you to take your nausea medication as directed.   If you develop nausea and vomiting that is not controlled by your nausea medication, call the clinic.   BELOW ARE SYMPTOMS THAT SHOULD BE REPORTED IMMEDIATELY:  *FEVER GREATER THAN 100.5 F  *CHILLS WITH OR WITHOUT FEVER  NAUSEA AND VOMITING THAT IS NOT CONTROLLED WITH YOUR NAUSEA MEDICATION  *UNUSUAL SHORTNESS OF BREATH  *UNUSUAL BRUISING OR BLEEDING  TENDERNESS IN MOUTH AND THROAT WITH OR WITHOUT PRESENCE OF ULCERS  *URINARY PROBLEMS  *BOWEL PROBLEMS  UNUSUAL RASH Items with * indicate a potential emergency and should be followed up as soon as possible.  Feel free to call the clinic should you have any questions or concerns. The clinic phone number is [phone].  Please show the [facility] at check-in to the Emergency Department and triage nurse.

## 2020-08-02 ENCOUNTER — Ambulatory Visit
Admission: RE | Admit: 2020-08-02 | Discharge: 2020-08-02 | Disposition: A | Discharge status: Home / Self Care | Payer: Medicare HMO | Source: Ambulatory Visit | Attending: Radiation Oncology | Admitting: Radiation Oncology

## 2020-08-02 ENCOUNTER — Other Ambulatory Visit: Payer: Self-pay

## 2020-08-02 DIAGNOSIS — C3432 Malignant neoplasm of lower lobe, left bronchus or lung: Secondary | ICD-10-CM | POA: Diagnosis not present

## 2020-08-02 DIAGNOSIS — Z510 Encounter for antineoplastic radiation therapy: Secondary | ICD-10-CM | POA: Diagnosis not present

## 2020-08-02 DIAGNOSIS — Z87891 Personal history of nicotine dependence: Secondary | ICD-10-CM | POA: Diagnosis not present

## 2020-08-03 ENCOUNTER — Other Ambulatory Visit: Payer: Self-pay

## 2020-08-03 ENCOUNTER — Ambulatory Visit
Admission: RE | Admit: 2020-08-03 | Discharge: 2020-08-03 | Disposition: A | Discharge status: Home / Self Care | Payer: Medicare HMO | Source: Ambulatory Visit | Attending: Radiation Oncology | Admitting: Radiation Oncology

## 2020-08-03 DIAGNOSIS — C3432 Malignant neoplasm of lower lobe, left bronchus or lung: Secondary | ICD-10-CM | POA: Diagnosis not present

## 2020-08-03 DIAGNOSIS — Z510 Encounter for antineoplastic radiation therapy: Secondary | ICD-10-CM | POA: Diagnosis not present

## 2020-08-03 DIAGNOSIS — Z87891 Personal history of nicotine dependence: Secondary | ICD-10-CM | POA: Diagnosis not present

## 2020-08-04 ENCOUNTER — Ambulatory Visit
Admission: RE | Admit: 2020-08-04 | Discharge: 2020-08-04 | Disposition: A | Discharge status: Home / Self Care | Payer: Medicare HMO | Source: Ambulatory Visit | Attending: Radiation Oncology | Admitting: Radiation Oncology

## 2020-08-04 ENCOUNTER — Telehealth: Payer: Self-pay | Admitting: Internal Medicine

## 2020-08-04 DIAGNOSIS — R918 Other nonspecific abnormal finding of lung field: Secondary | ICD-10-CM | POA: Diagnosis not present

## 2020-08-04 DIAGNOSIS — I10 Essential (primary) hypertension: Secondary | ICD-10-CM | POA: Diagnosis not present

## 2020-08-04 DIAGNOSIS — K565 Intestinal adhesions [bands], unspecified as to partial versus complete obstruction: Secondary | ICD-10-CM | POA: Diagnosis not present

## 2020-08-04 DIAGNOSIS — M170 Bilateral primary osteoarthritis of knee: Secondary | ICD-10-CM | POA: Diagnosis not present

## 2020-08-04 DIAGNOSIS — Z48815 Encounter for surgical aftercare following surgery on the digestive system: Secondary | ICD-10-CM | POA: Diagnosis not present

## 2020-08-04 DIAGNOSIS — Z510 Encounter for antineoplastic radiation therapy: Secondary | ICD-10-CM | POA: Diagnosis not present

## 2020-08-04 DIAGNOSIS — C3432 Malignant neoplasm of lower lobe, left bronchus or lung: Secondary | ICD-10-CM | POA: Diagnosis not present

## 2020-08-04 DIAGNOSIS — E049 Nontoxic goiter, unspecified: Secondary | ICD-10-CM | POA: Diagnosis not present

## 2020-08-04 DIAGNOSIS — M19041 Primary osteoarthritis, right hand: Secondary | ICD-10-CM | POA: Diagnosis not present

## 2020-08-04 DIAGNOSIS — J4550 Severe persistent asthma, uncomplicated: Secondary | ICD-10-CM | POA: Diagnosis not present

## 2020-08-04 DIAGNOSIS — Z87891 Personal history of nicotine dependence: Secondary | ICD-10-CM | POA: Diagnosis not present

## 2020-08-04 DIAGNOSIS — U071 COVID-19: Secondary | ICD-10-CM | POA: Diagnosis not present

## 2020-08-04 NOTE — Telephone Encounter (Signed)
Angelica [name] called and said that the patient had one visit remaining this week and she requested non visit discharge because of side effects of radiation for cancer treatment. Please advise.

## 2020-08-04 NOTE — Telephone Encounter (Signed)
Ok for verbal 

## 2020-08-04 NOTE — Telephone Encounter (Signed)
LDVM on confidential VM with verbal orders per PCP.

## 2020-08-05 ENCOUNTER — Other Ambulatory Visit: Payer: Self-pay

## 2020-08-05 ENCOUNTER — Encounter: Payer: Self-pay | Admitting: Radiation Oncology

## 2020-08-05 ENCOUNTER — Ambulatory Visit
Admission: RE | Admit: 2020-08-05 | Discharge: 2020-08-05 | Disposition: A | Discharge status: Home / Self Care | Payer: Medicare HMO | Source: Ambulatory Visit | Attending: Radiation Oncology | Admitting: Radiation Oncology

## 2020-08-05 ENCOUNTER — Telehealth: Payer: Self-pay

## 2020-08-05 DIAGNOSIS — Z87891 Personal history of nicotine dependence: Secondary | ICD-10-CM | POA: Diagnosis not present

## 2020-08-05 DIAGNOSIS — C3432 Malignant neoplasm of lower lobe, left bronchus or lung: Secondary | ICD-10-CM | POA: Diagnosis not present

## 2020-08-05 DIAGNOSIS — Z510 Encounter for antineoplastic radiation therapy: Secondary | ICD-10-CM | POA: Diagnosis not present

## 2020-08-05 MED FILL — Dexamethasone Sodium Phosphate Inj 100 MG/10ML: INTRAMUSCULAR | Qty: 2 | Status: AC

## 2020-08-05 NOTE — Telephone Encounter (Signed)
Angelica [name] with [facility] called requesting authorization for one additional week of SNF services.  I advised Angelica [name] to please fax this request to [place] at [phone]. She advised she will electronically enter the order and her main office will fax the request.

## 2020-08-06 ENCOUNTER — Other Ambulatory Visit: Payer: Self-pay | Admitting: Internal Medicine

## 2020-08-06 NOTE — Telephone Encounter (Signed)
Please refill as per office routine med refill policy (all routine meds refilled for 3 mo or monthly per pt preference up to one year from last visit, then month to month grace period for 3 mo, then further med refills will have to be denied)  

## 2020-08-08 ENCOUNTER — Other Ambulatory Visit: Payer: Self-pay

## 2020-08-08 ENCOUNTER — Inpatient Hospital Stay (HOSPITAL_BASED_OUTPATIENT_CLINIC_OR_DEPARTMENT_OTHER): Payer: Medicare HMO | Admitting: Internal Medicine

## 2020-08-08 ENCOUNTER — Inpatient Hospital Stay: Payer: Medicare HMO

## 2020-08-08 ENCOUNTER — Ambulatory Visit: Payer: Medicare HMO

## 2020-08-08 ENCOUNTER — Telehealth: Payer: Self-pay | Admitting: Internal Medicine

## 2020-08-08 ENCOUNTER — Telehealth: Payer: Self-pay

## 2020-08-08 ENCOUNTER — Other Ambulatory Visit: Payer: Self-pay | Admitting: *Deleted

## 2020-08-08 VITALS — BP 137/79 | HR 104 | Temp 98.2°F | Resp 20 | Ht 65.0 in | Wt 158.6 lb

## 2020-08-08 DIAGNOSIS — I10 Essential (primary) hypertension: Secondary | ICD-10-CM

## 2020-08-08 DIAGNOSIS — Z923 Personal history of irradiation: Secondary | ICD-10-CM | POA: Diagnosis not present

## 2020-08-08 DIAGNOSIS — C3432 Malignant neoplasm of lower lobe, left bronchus or lung: Secondary | ICD-10-CM | POA: Diagnosis not present

## 2020-08-08 DIAGNOSIS — C770 Secondary and unspecified malignant neoplasm of lymph nodes of head, face and neck: Secondary | ICD-10-CM | POA: Diagnosis not present

## 2020-08-08 DIAGNOSIS — C3490 Malignant neoplasm of unspecified part of unspecified bronchus or lung: Secondary | ICD-10-CM

## 2020-08-08 DIAGNOSIS — C3492 Malignant neoplasm of unspecified part of left bronchus or lung: Secondary | ICD-10-CM

## 2020-08-08 DIAGNOSIS — K219 Gastro-esophageal reflux disease without esophagitis: Secondary | ICD-10-CM | POA: Diagnosis not present

## 2020-08-08 DIAGNOSIS — E785 Hyperlipidemia, unspecified: Secondary | ICD-10-CM | POA: Diagnosis not present

## 2020-08-08 DIAGNOSIS — Z5111 Encounter for antineoplastic chemotherapy: Secondary | ICD-10-CM

## 2020-08-08 DIAGNOSIS — C781 Secondary malignant neoplasm of mediastinum: Secondary | ICD-10-CM | POA: Diagnosis not present

## 2020-08-08 DIAGNOSIS — Z95828 Presence of other vascular implants and grafts: Secondary | ICD-10-CM

## 2020-08-08 DIAGNOSIS — M8580 Other specified disorders of bone density and structure, unspecified site: Secondary | ICD-10-CM | POA: Diagnosis not present

## 2020-08-08 LAB — CMP (CANCER CENTER ONLY)
ALT: 18 U/L (ref 0–44)
AST: 18 U/L (ref 15–41)
Albumin: 3.3 g/dL — ABNORMAL LOW (ref 3.5–5.0)
Alkaline Phosphatase: 63 U/L (ref 38–126)
Anion gap: 5 (ref 5–15)
BUN: 11 mg/dL (ref 8–23)
CO2: 23 mmol/L (ref 22–32)
Calcium: 8.9 mg/dL (ref 8.9–10.3)
Chloride: 104 mmol/L (ref 98–111)
Creatinine: 0.73 mg/dL (ref 0.44–1.00)
GFR, Estimated: >60 mL/min (ref >60)
Glucose, Bld: 106 mg/dL — ABNORMAL HIGH (ref 70–99)
Potassium: 3.5 mmol/L (ref 3.5–5.1)
Sodium: 132 mmol/L — ABNORMAL LOW (ref 135–145)
Total Bilirubin: 0.4 mg/dL (ref 0.3–1.2)
Total Protein: 6.4 g/dL — ABNORMAL LOW (ref 6.5–8.1)

## 2020-08-08 LAB — CBC WITH DIFFERENTIAL (CANCER CENTER ONLY)
Abs Immature Granulocytes: 0.01 10*3/uL (ref 0.00–0.07)
Basophils Absolute: 0 10*3/uL (ref 0.0–0.1)
Basophils Relative: 0 %
Eosinophils Absolute: 0 10*3/uL (ref 0.0–0.5)
Eosinophils Relative: 0 %
HCT: 30.9 % — ABNORMAL LOW (ref 36.0–46.0)
Hemoglobin: 10.6 g/dL — ABNORMAL LOW (ref 12.0–15.0)
Immature Granulocytes: 1 %
Lymphocytes Relative: 13 %
Lymphs Abs: 0.3 10*3/uL — ABNORMAL LOW (ref 0.7–4.0)
MCH: 30.1 pg (ref 26.0–34.0)
MCHC: 34.3 g/dL (ref 30.0–36.0)
MCV: 87.8 fL (ref 80.0–100.0)
Monocytes Absolute: 0.2 10*3/uL (ref 0.1–1.0)
Monocytes Relative: 9 %
Neutro Abs: 1.6 10*3/uL — ABNORMAL LOW (ref 1.7–7.7)
Neutrophils Relative %: 77 %
Platelet Count: 116 10*3/uL — ABNORMAL LOW (ref 150–400)
RBC: 3.52 MIL/uL — ABNORMAL LOW (ref 3.87–5.11)
RDW: 13.6 % (ref 11.5–15.5)
WBC Count: 2 10*3/uL — ABNORMAL LOW (ref 4.0–10.5)
nRBC: 0 % (ref 0.0–0.2)

## 2020-08-08 MED ORDER — SYMBICORT 80-4.5 MCG/ACT IN AERO: 2.0000 | INHALATION_SPRAY | Freq: Two times a day (BID) | RESPIRATORY_TRACT | 5 refills | Status: DC

## 2020-08-08 MED ORDER — SODIUM CHLORIDE 0.9% FLUSH
10.0000 mL | Freq: Once | INTRAVENOUS | Status: AC
  Administered 2020-08-08: 10 mL via INTRAVENOUS
  Filled 2020-08-08: qty 10

## 2020-08-08 MED ORDER — SODIUM CHLORIDE 0.9% FLUSH
10.0000 mL | INTRAVENOUS | Status: DC | PRN
  Administered 2020-08-08: 10 mL via INTRAVENOUS
  Filled 2020-08-08: qty 10

## 2020-08-08 MED ORDER — HEPARIN SOD (PORK) LOCK FLUSH 100 UNIT/ML IV SOLN
500.0000 [IU] | Freq: Once | INTRAVENOUS | Status: AC
  Administered 2020-08-08: 500 [IU] via INTRAVENOUS
  Filled 2020-08-08: qty 5

## 2020-08-08 NOTE — Telephone Encounter (Signed)
Patients daughter called stating the patients Ruthe Mannan is requiring a Prior Authorization. She is wondering can this be started. Daughter states if not patient can take Symbicort per Dr Julaine Hua last ov note. Patient hasn't had her inhaler in 2-3 days & is in need of it. Daughter states Symbicort is covered by the insurance and doesn't need a PA.    [place]

## 2020-08-08 NOTE — Telephone Encounter (Signed)
Looked at the patient's last office not and Dr. Nelva Bush did state that the patient can do [facility] or Symbicort. I called and spoke with the patients daughter and she states that she feels that Symbicort works better. A new prescription has been sent in to the requested pharmacy. I advised that if she needs anything further to please let us know. Patient's daughter verbalized understanding.

## 2020-08-08 NOTE — Progress Notes (Signed)
[facility] [phone]   [phone]  OFFICE PROGRESS NOTE  [name], MD [place] [address]  DIAGNOSIS: Stage IIIB (T2 a, N3, M0) non-small cell lung cancer, adenocarcinoma presented with left lower lobe lung mass in addition to left hilar and mediastinal as well as right and left cervical lymphadenopathy diagnosed in January 2022  PRIOR THERAPY: Concurrent chemoradiation with weekly carboplatin for AUC of 2 and paclitaxel 45 mg/M2.  Status post 5 cycles.  Last dose was given August 01, 2020.  CURRENT THERAPY: None   INTERVAL HISTORY: Angelica [name] 80 y.o. female returns to the clinic today for follow-up visit accompanied by her daughter.  The patient is feeling fine today with no concerning complaints except for mild odynophagia.  She also has mild fatigue.  She denied having any chest pain, shortness of breath, cough or hemoptysis.  She has no nausea, vomiting, diarrhea or constipation.  She has no headache or visual changes.  She has been tolerating her treatment with concurrent chemoradiation fairly well.  She completed radiotherapy last week.  She is here today for evaluation for cycle #6 of her chemotherapy.  MEDICAL HISTORY: Past Medical History:  Diagnosis Date  . Arthritis   . Asthma   . Bowel obstruction ([place])   . Environmental allergies   . GERD (gastroesophageal reflux disease)   . Glaucoma 05/09/2017  . H. pylori infection   . HLD (hyperlipidemia) 01/16/2019  . Hypertension   . Iron deficiency anemia   . Osteopenia 05/26/2017  . Thyroid disease   . Urticaria 07/25/2018    ALLERGIES:  is allergic to asa buff (mag [buffered aspirin], ensure [nutritional supplements], fish allergy, milk-related compounds, other, peanut-containing drug products, penicillins, shellfish-derived products, and strawberry extract.  MEDICATIONS:  Current Outpatient Medications  Medication Sig Dispense Refill  . acetaminophen  (TYLENOL) 325 MG tablet Take 2 tablets (650 mg total) by mouth every 6 (six) hours as needed for headache. (Patient taking differently: Take 325 mg by mouth every 6 (six) hours as needed for headache or mild pain.)    . amLODipine (NORVASC) 10 MG tablet Take 1 tablet (10 mg total) by mouth daily. 90 tablet 0  . Benralizumab (FASENRA PEN) 30 MG/ML SOAJ Inject into the skin every 8 (eight) weeks.    . budesonide-formoterol (SYMBICORT) 80-4.5 MCG/ACT inhaler Inhale 2 puffs into the lungs 2 (two) times daily. Use for asthma flares 2 puffs twice a day for 2 weeks or until cough and wheeze free (Patient taking differently: Inhale 2 puffs into the lungs 2 (two) times daily as needed (for asthma flares, for 2 weeks, or until cough and wheeze-free).) 10.2 g 5  . Cholecalciferol (VITAMIN D3) 50 MCG (2000 UT) TABS Take 2,000 Units by mouth daily.    Marland Kitchen EPINEPHrine (EPIPEN 2-PAK) 0.3 mg/0.3 mL IJ SOAJ injection Use as directed for severe allergic reaction (Patient taking differently: Inject 0.3 mg into the muscle as needed for anaphylaxis.) 2 each 1  . ipratropium (ATROVENT) 0.06 % nasal spray Place 2 sprays into both nostrils 3 (three) times daily. 15 mL 3  . linaclotide (LINZESS) 72 MCG capsule TAKE 1 CAPSULE BY MOUTH ONCE DAILY AS NEEDED (Patient taking differently: Take 72 mcg by mouth daily as needed (constipation).) 30 capsule 0  . loratadine (CLARITIN) 10 MG tablet Take 10 mg by mouth daily.    Marland Kitchen losartan (COZAAR) 100 MG tablet Take 100 mg by mouth daily.    Marland Kitchen  Magnesium Hydroxide (PHILLIPS MILK OF MAGNESIA PO) Take 15-30 mLs by mouth daily as needed (for constipation).    . mometasone-formoterol (DULERA) 100-5 MCG/ACT AERO Inhale 1 puff into the lungs daily.    . mometasone-formoterol (DULERA) 100-5 MCG/ACT AERO Inhale 2 puffs into the lungs in the morning and at bedtime. 13 g 2  . pravastatin (PRAVACHOL) 80 MG tablet Take 1 tablet (80 mg total) by mouth daily. 90 tablet 3  . prochlorperazine (COMPAZINE) 10  MG tablet Take 1 tablet (10 mg total) by mouth every 6 (six) hours as needed for nausea or vomiting. 30 tablet 0  . sucralfate (CARAFATE) 1 g tablet Take 1 tablet (1 g total) by mouth 4 (four) times daily -  with meals and at bedtime. Crush and dissolve in 10 mL of warm water prior to swallowing 120 tablet 1  . telmisartan (MICARDIS) 80 MG tablet Take 1 tablet (80 mg total) by mouth daily. (Patient taking differently: Take 80 mg by mouth daily. Will take when losartan runs out ( has been on B/O)) 90 tablet 3  . traMADol (ULTRAM) 50 MG tablet Take 1 tablet (50 mg total) by mouth every 6 (six) hours as needed for moderate pain. 30 tablet 2  . vitamin B-12 (CYANOCOBALAMIN) 1000 MCG tablet Take 1,000 mcg by mouth daily.     Current Facility-Administered Medications  Medication Dose Route Frequency Provider Last Rate Last Admin  . Benralizumab SOSY 30 mg  30 mg Subcutaneous Q8 Weeks Kennith Gain, MD   30 mg at 07/05/20 1126    SURGICAL HISTORY:  Past Surgical History:  Procedure Laterality Date  . ABDOMINAL HYSTERECTOMY    . BOWEL RESECTION N/A 05/23/2020   Procedure: SMALL BOWEL REPAIR;  Surgeon: Kinsinger, Arta Bruce, MD;  Location: [facility];  Service: General;  Laterality: N/A;  . BREAST BIOPSY    . BRONCHIAL BIOPSY  06/10/2020   Procedure: BRONCHIAL BIOPSIES;  Surgeon: Garner Nash, DO;  Location: [facility];  Service: Pulmonary;;  . BRONCHIAL BRUSHINGS  06/10/2020   Procedure: BRONCHIAL BRUSHINGS;  Surgeon: Garner Nash, DO;  Location: [facility];  Service: Pulmonary;;  . BRONCHIAL NEEDLE ASPIRATION BIOPSY  06/10/2020   Procedure: BRONCHIAL NEEDLE ASPIRATION BIOPSIES;  Surgeon: Garner Nash, DO;  Location: [facility];  Service: Pulmonary;;  . BRONCHIAL WASHINGS  06/10/2020   Procedure: BRONCHIAL WASHINGS;  Surgeon: Garner Nash, DO;  Location: [facility];  Service: Pulmonary;;  . COLON SURGERY    . DIAGNOSTIC LARYNGOSCOPY N/A 05/23/2020   Procedure: ATTEMPTED  DIAGNOSTIC LAPAROSCOPY WITH ADHESIONS;  Surgeon: Kieth Brightly, Arta Bruce, MD;  Location: [facility];  Service: General;  Laterality: N/A;  . IR IMAGING GUIDED PORT INSERTION  07/15/2020  . KNEE SURGERY    . LAPAROTOMY N/A 04/18/2015   Procedure: Exploratory laparotomy with lysis of adhesions, possible bowel resection;  Surgeon: Ralene Ok, MD;  Location: [facility];  Service: General;  Laterality: N/A;  . LAPAROTOMY N/A 05/23/2020   Procedure: EXPLORATORY LAPAROTOMY;  Surgeon: Kieth Brightly Arta Bruce, MD;  Location: [facility];  Service: General;  Laterality: N/A;  . LYSIS OF ADHESION N/A 05/23/2020   Procedure: LYSIS OF ADHESION;  Surgeon: Kieth Brightly Arta Bruce, MD;  Location: [facility];  Service: General;  Laterality: N/A;  . NASAL SINUS SURGERY    . THYROID SURGERY    . VIDEO BRONCHOSCOPY WITH ENDOBRONCHIAL NAVIGATION Bilateral 06/10/2020   Procedure: VIDEO BRONCHOSCOPY WITH ENDOBRONCHIAL NAVIGATION;  Surgeon: Garner Nash, DO;  Location: [facility];  Service:  Pulmonary;  Laterality: Bilateral;  . VIDEO BRONCHOSCOPY WITH ENDOBRONCHIAL ULTRASOUND  06/10/2020   Procedure: VIDEO BRONCHOSCOPY WITH ENDOBRONCHIAL ULTRASOUND;  Surgeon: Garner Nash, DO;  Location: [facility];  Service: Pulmonary;;    REVIEW OF SYSTEMS:  A comprehensive review of systems was negative except for: Gastrointestinal: positive for odynophagia Neurological: positive for memory problems   PHYSICAL EXAMINATION: General appearance: alert, cooperative and no distress Head: Normocephalic, without obvious abnormality, atraumatic Neck: no adenopathy, no JVD, supple, symmetrical, trachea midline and thyroid not enlarged, symmetric, no tenderness/mass/nodules Lymph nodes: Cervical, supraclavicular, and axillary nodes normal. Resp: clear to auscultation bilaterally Back: symmetric, no curvature. ROM normal. No CVA tenderness. Cardio: regular rate and rhythm, S1, S2 normal, no murmur, click, rub or gallop GI: soft, non-tender; bowel sounds  normal; no masses,  no organomegaly Extremities: extremities normal, atraumatic, no cyanosis or edema  ECOG PERFORMANCE STATUS: 1 - Symptomatic but completely ambulatory  Blood pressure 137/79, pulse (!) 104, temperature 98.2 F (36.8 C), temperature source Oral, resp. rate 20, height 5\' 5"  (1.651 m), weight 158 lb 9.6 oz (71.9 kg), SpO2 100 %.  LABORATORY DATA: Lab Results  Component Value Date   WBC 2.0 (L) 08/08/2020   HGB 10.6 (L) 08/08/2020   HCT 30.9 (L) 08/08/2020   MCV 87.8 08/08/2020   PLT 116 (L) 08/08/2020      Chemistry      Component Value Date/Time   NA 138 08/01/2020 0835   K 3.4 (L) 08/01/2020 0835   CL 104 08/01/2020 0835   CO2 23 08/01/2020 0835   BUN 9 08/01/2020 0835   CREATININE 0.73 08/01/2020 0835      Component Value Date/Time   CALCIUM 8.9 08/01/2020 0835   ALKPHOS 62 08/01/2020 0835   AST 22 08/01/2020 0835   ALT 18 08/01/2020 0835   BILITOT 0.5 08/01/2020 0835       RADIOGRAPHIC STUDIES: IR IMAGING GUIDED PORT INSERTION  Result Date: 07/15/2020 INDICATION: 79 year old with lung cancer and poor venous access. Port-A-Cath needed for treatment. EXAM: FLUOROSCOPIC AND ULTRASOUND GUIDED PLACEMENT OF A SUBCUTANEOUS PORT COMPARISON:  None. MEDICATIONS: Moderate sedation ANESTHESIA/SEDATION: Versed 1.0 mg IV; Fentanyl 50 mcg IV; Moderate Sedation Time:  31 minutes The patient was continuously monitored during the procedure by the interventional radiology nurse under my direct supervision. FLUOROSCOPY TIME:  18 seconds, 1 mGy COMPLICATIONS: None immediate. PROCEDURE: The procedure, risks, benefits, and alternatives were explained to the patient. Questions regarding the procedure were encouraged and answered. The patient understands and consents to the procedure. Patient was placed supine on the interventional table. Ultrasound confirmed a patent right internal jugular vein. Ultrasound image was saved for documentation. The right chest and neck were cleaned  with a skin antiseptic and a sterile drape was placed. Maximal barrier sterile technique was utilized including caps, mask, sterile gowns, sterile gloves, sterile drape, hand hygiene and skin antiseptic. The right neck was anesthetized with 1% lidocaine. Small incision was made in the right neck with a blade. Micropuncture set was placed in the right internal jugular vein with ultrasound guidance. The micropuncture wire was used for measurement purposes. The right chest was anesthetized with 1% lidocaine with epinephrine. #15 blade was used to make an incision and a subcutaneous port pocket was formed. [place] was assembled. Subcutaneous tunnel was formed with a stiff tunneling device. The port catheter was brought through the subcutaneous tunnel. The port was placed in the subcutaneous pocket. The micropuncture set was exchanged for a peel-away sheath.  The catheter was placed through the peel-away sheath and the tip was positioned at the superior cavoatrial junction. Catheter placement was confirmed with fluoroscopy. The port was accessed and flushed with heparinized saline. The port pocket was closed using two layers of absorbable sutures and Dermabond. The vein skin site was closed using a single layer of absorbable suture and Dermabond. Sterile dressings were applied. Patient tolerated the procedure well without an immediate complication. Ultrasound and fluoroscopic images were taken and saved for this procedure. IMPRESSION: Placement of a subcutaneous port device. Catheter tip at the superior cavoatrial junction. Electronically Signed   By: Markus Daft M.D.   On: 07/15/2020 17:26    ASSESSMENT AND PLAN: This is a very pleasant 80 years old African-American female recently diagnosed with a stage IIb (T2a, N3, M0) non-small cell lung cancer, adenocarcinoma presented with left lower lobe lung mass in addition to left hilar and mediastinal lymphadenopathy as well as right and left cervical  lymphadenopathy diagnosed in January 2022. The patient is currently undergoing a course of concurrent chemoradiation with weekly carboplatin and paclitaxel status post 5 cycles.  She completed the course of radiotherapy last week. She was supposed to start cycle #6 today but after completion of her radiotherapy we will discontinue the chemotherapy 2. I will arrange for the patient to have repeat CT scan of the chest in 3 weeks for restaging of her disease. If she has no evidence for disease progression, I may consider her for treatment with immunotherapy but this will be discussed in more details with her upcoming visit. The patient was advised to call immediately if she has any other concerning symptoms in the interval. The patient voices understanding of current disease status and treatment options and is in agreement with the current care plan.  All questions were answered. The patient knows to call the clinic with any problems, questions or concerns. We can certainly see the patient much sooner if necessary.  Disclaimer: This note was dictated with voice recognition software. Similar sounding words can inadvertently be transcribed and may not be corrected upon review.

## 2020-08-08 NOTE — Patient Instructions (Signed)

## 2020-08-08 NOTE — Telephone Encounter (Signed)
Scheduled per 03/21 los, patient should be notified per My chart.

## 2020-08-09 ENCOUNTER — Ambulatory Visit: Payer: Medicare HMO

## 2020-08-10 ENCOUNTER — Ambulatory Visit: Payer: Medicare HMO

## 2020-08-11 ENCOUNTER — Ambulatory Visit: Payer: Medicare HMO

## 2020-08-12 ENCOUNTER — Ambulatory Visit: Payer: Medicare HMO

## 2020-08-12 DIAGNOSIS — M19041 Primary osteoarthritis, right hand: Secondary | ICD-10-CM | POA: Diagnosis not present

## 2020-08-12 DIAGNOSIS — J4550 Severe persistent asthma, uncomplicated: Secondary | ICD-10-CM | POA: Diagnosis not present

## 2020-08-12 DIAGNOSIS — C3432 Malignant neoplasm of lower lobe, left bronchus or lung: Secondary | ICD-10-CM | POA: Diagnosis not present

## 2020-08-12 DIAGNOSIS — M170 Bilateral primary osteoarthritis of knee: Secondary | ICD-10-CM | POA: Diagnosis not present

## 2020-08-12 DIAGNOSIS — K565 Intestinal adhesions [bands], unspecified as to partial versus complete obstruction: Secondary | ICD-10-CM | POA: Diagnosis not present

## 2020-08-12 DIAGNOSIS — I10 Essential (primary) hypertension: Secondary | ICD-10-CM | POA: Diagnosis not present

## 2020-08-12 DIAGNOSIS — Z48815 Encounter for surgical aftercare following surgery on the digestive system: Secondary | ICD-10-CM | POA: Diagnosis not present

## 2020-08-12 DIAGNOSIS — R590 Localized enlarged lymph nodes: Secondary | ICD-10-CM | POA: Diagnosis not present

## 2020-08-12 DIAGNOSIS — E049 Nontoxic goiter, unspecified: Secondary | ICD-10-CM | POA: Diagnosis not present

## 2020-08-15 ENCOUNTER — Ambulatory Visit: Payer: Medicare HMO

## 2020-08-15 ENCOUNTER — Inpatient Hospital Stay: Payer: Medicare HMO

## 2020-08-15 ENCOUNTER — Telehealth: Payer: Self-pay | Admitting: Allergy

## 2020-08-15 ENCOUNTER — Other Ambulatory Visit: Payer: Self-pay

## 2020-08-15 MED ORDER — MONTELUKAST SODIUM 10 MG PO TABS: 10.0000 mg | ORAL_TABLET | Freq: Every day | ORAL | 5 refills | Status: DC

## 2020-08-15 NOTE — Telephone Encounter (Signed)
Refill sent in for montleukast

## 2020-08-15 NOTE — Telephone Encounter (Signed)
Please advise if patient can start singular. I did not see it listed as a medication or mentioned in patient plan as a new start medication.

## 2020-08-15 NOTE — Telephone Encounter (Signed)
Patient's daughter called requesting Singulair for her mother, Angelica [name]. She said she is having a lot of drainage. [facility] on [address].

## 2020-08-15 NOTE — Telephone Encounter (Signed)
That is fine to refill Singulair 10 mg daily for her allergies.  At the time of her last visit she was no longer taking Singulair but has done so in the past without issue.

## 2020-08-16 DIAGNOSIS — E049 Nontoxic goiter, unspecified: Secondary | ICD-10-CM | POA: Diagnosis not present

## 2020-08-16 DIAGNOSIS — J4550 Severe persistent asthma, uncomplicated: Secondary | ICD-10-CM | POA: Diagnosis not present

## 2020-08-16 DIAGNOSIS — Z48815 Encounter for surgical aftercare following surgery on the digestive system: Secondary | ICD-10-CM | POA: Diagnosis not present

## 2020-08-16 DIAGNOSIS — R590 Localized enlarged lymph nodes: Secondary | ICD-10-CM | POA: Diagnosis not present

## 2020-08-16 DIAGNOSIS — C3432 Malignant neoplasm of lower lobe, left bronchus or lung: Secondary | ICD-10-CM | POA: Diagnosis not present

## 2020-08-16 DIAGNOSIS — M170 Bilateral primary osteoarthritis of knee: Secondary | ICD-10-CM | POA: Diagnosis not present

## 2020-08-16 DIAGNOSIS — M19041 Primary osteoarthritis, right hand: Secondary | ICD-10-CM | POA: Diagnosis not present

## 2020-08-16 DIAGNOSIS — K565 Intestinal adhesions [bands], unspecified as to partial versus complete obstruction: Secondary | ICD-10-CM | POA: Diagnosis not present

## 2020-08-16 DIAGNOSIS — I10 Essential (primary) hypertension: Secondary | ICD-10-CM | POA: Diagnosis not present

## 2020-08-22 ENCOUNTER — Encounter: Payer: Self-pay | Admitting: Pulmonary Disease

## 2020-08-22 ENCOUNTER — Other Ambulatory Visit: Payer: Self-pay

## 2020-08-22 ENCOUNTER — Ambulatory Visit: Payer: Medicare HMO | Admitting: Pulmonary Disease

## 2020-08-22 VITALS — BP 114/70 | HR 88 | Temp 97.6°F | Ht 65.0 in | Wt 158.0 lb

## 2020-08-22 DIAGNOSIS — C3492 Malignant neoplasm of unspecified part of left bronchus or lung: Secondary | ICD-10-CM

## 2020-08-22 DIAGNOSIS — J4550 Severe persistent asthma, uncomplicated: Secondary | ICD-10-CM

## 2020-08-22 DIAGNOSIS — Z923 Personal history of irradiation: Secondary | ICD-10-CM

## 2020-08-22 NOTE — Progress Notes (Signed)
Synopsis: Referred in January 2022 for lung mass by [name], MD  Subjective:   PATIENT ID: Angelica [name] GENDER: female DOB: 12-29-1940, MRN: [id number]  Chief Complaint  Patient presents with  . Follow-up    No complaints    This is an 80 year old female with a past medical history of bowel obstruction, hyperlipidemia, hypertension, thyroid disease, asthma, allergies on Fasenra [facility].  Patient had a recent hospitalization, found to be COVID-positive, both vaccines plus booster.  She had a small bowel obstruction with abdominal pain nausea vomiting, incidental COVID infection.  Seen by general surgery underwent laparoscopy with lysis of adhesions during hospitalization had to be started on TPN.  Ultimately was discharged home.  During this evaluation patient found to have a left-sided lower lobe lung mass with associated mediastinal adenopathy.  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  Patient was referred at the time of discharge from the hospital for evaluation by pulmonary medicine for consideration of tissue diagnosis and biopsy after recovery from COVID-19.  At this time patient has no significant respiratory complaints has been doing well at home.  Patient accompanied today in the office by patient's daughter.  They are in agreements for consideration for tissue biopsy and bronchoscopy.  Patient denies fevers chills, denies hemoptysis.  She has been losing weight she does have poor appetite.  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  She has severe asthma at baseline on Symbicort plus Fasenra.  Respiratory status at this time relatively stable.  Bronchoscopy went well.  She has completed her chemotherapy cycle plus  radiation.  Office note from 08/08/2020, Dr. Earlie Server reviewed today in clinic.  Patient's weight has been stable.  Eating well.    Past Medical History:  Diagnosis Date  . Arthritis   . Asthma   . Bowel obstruction ([place])   . Environmental allergies   . GERD (gastroesophageal reflux disease)   . Glaucoma 05/09/2017  . H. pylori infection   . HLD (hyperlipidemia) 01/16/2019  . Hypertension   . Iron deficiency anemia   . Osteopenia 05/26/2017  . Thyroid disease   . Urticaria 07/25/2018     Family History  Problem Relation Age of Onset  . Diabetes Mother   . Hypertension Mother   . Hypertension Father   . Glaucoma Sister   . Glaucoma Brother   . Allergic rhinitis Neg Hx   . Angioedema Neg Hx   . Asthma Neg Hx   . Eczema Neg Hx   . Immunodeficiency Neg Hx   . Urticaria Neg Hx      Past Surgical History:  Procedure Laterality Date  . ABDOMINAL HYSTERECTOMY    . BOWEL RESECTION N/A 05/23/2020   Procedure: SMALL BOWEL REPAIR;  Surgeon: Kinsinger, Arta Bruce, MD;  Location: [facility];  Service: General;  Laterality: N/A;  . BREAST BIOPSY    . BRONCHIAL BIOPSY  06/10/2020   Procedure: BRONCHIAL BIOPSIES;  Surgeon: Garner Nash, DO;  Location: [facility];  Service: Pulmonary;;  . BRONCHIAL BRUSHINGS  06/10/2020   Procedure: BRONCHIAL BRUSHINGS;  Surgeon: Garner Nash, DO;  Location: [facility];  Service: Pulmonary;;  . BRONCHIAL NEEDLE ASPIRATION BIOPSY  06/10/2020   Procedure: BRONCHIAL NEEDLE ASPIRATION BIOPSIES;  Surgeon: Garner Nash, DO;  Location: [facility];  Service: Pulmonary;;  . BRONCHIAL WASHINGS  06/10/2020   Procedure: BRONCHIAL WASHINGS;  Surgeon: Garner Nash, DO;  Location: [facility];  Service: Pulmonary;;  . COLON SURGERY    . DIAGNOSTIC LARYNGOSCOPY N/A 05/23/2020   Procedure: ATTEMPTED DIAGNOSTIC LAPAROSCOPY WITH ADHESIONS;  Surgeon: Kieth Brightly, Arta Bruce, MD;  Location: [facility];  Service: General;  Laterality: N/A;  . IR IMAGING GUIDED PORT  INSERTION  07/15/2020  . KNEE SURGERY    . LAPAROTOMY N/A 04/18/2015   Procedure: Exploratory laparotomy with lysis of adhesions, possible bowel resection;  Surgeon: Ralene Ok, MD;  Location: [facility];  Service: General;  Laterality: N/A;  . LAPAROTOMY N/A 05/23/2020   Procedure: EXPLORATORY LAPAROTOMY;  Surgeon: Kieth Brightly Arta Bruce, MD;  Location: [facility];  Service: General;  Laterality: N/A;  . LYSIS OF ADHESION N/A 05/23/2020   Procedure: LYSIS OF ADHESION;  Surgeon: Kieth Brightly Arta Bruce, MD;  Location: [facility];  Service: General;  Laterality: N/A;  . NASAL SINUS SURGERY    . THYROID SURGERY    . VIDEO BRONCHOSCOPY WITH ENDOBRONCHIAL NAVIGATION Bilateral 06/10/2020   Procedure: VIDEO BRONCHOSCOPY WITH ENDOBRONCHIAL NAVIGATION;  Surgeon: Garner Nash, DO;  Location: [facility];  Service: Pulmonary;  Laterality: Bilateral;  . VIDEO BRONCHOSCOPY WITH ENDOBRONCHIAL ULTRASOUND  06/10/2020   Procedure: VIDEO BRONCHOSCOPY WITH ENDOBRONCHIAL ULTRASOUND;  Surgeon: Garner Nash, DO;  Location: [facility];  Service: Pulmonary;;    Social History   Socioeconomic History  . Marital status: Widowed    Spouse name: Not on file  . Number of children: 4  . Years of education: Not on file  . Highest education level: Not on file  Occupational History  . Occupation: retired  Tobacco Use  . Smoking status: Former Smoker    Types: Cigarettes  . Smokeless tobacco: Never Used  . Tobacco comment: quit 60 years ago  Vaping Use  . Vaping Use: Never used  Substance and Sexual Activity  . Alcohol use: No  . Drug use: No  . Sexual activity: Never  Other Topics Concern  . Not on file  Social History Narrative  . Not on file   Social Determinants of Health   Financial Resource Strain: Low Risk   . Difficulty of Paying Living Expenses: Not hard at all  Food Insecurity: No Food Insecurity  . Worried About Charity fundraiser in the Last Year: Never true  . Ran Out of Food in the Last Year:  Never true  Transportation Needs: No Transportation Needs  . Lack of Transportation (Medical): No  . Lack of Transportation (Non-Medical): No  Physical Activity: Sufficiently Active  . Days of Exercise per Week: 5 days  . Minutes of Exercise per Session: 30 min  Stress: No Stress Concern Present  . Feeling of Stress : Not at all  Social Connections: Moderately Integrated  . Frequency of Communication with Friends and Family: More than three times a week  . Frequency of Social Gatherings with Friends and Family: More than three times a week  . Attends Religious Services: More than 4 times per year  . Active Member of Clubs or Organizations: Yes  . Attends Archivist Meetings: More than 4 times per year  . Marital Status: Widowed  Intimate Partner Violence: Not on file     Allergies  Allergen Reactions  . Asa Buff (Mag [Buffered Aspirin] Other (See Comments)    Excessive sweating  . Ensure [Nutritional Supplements]  Or boost - upset stomach   . Fish Allergy Other (See Comments)    Unknown reaction, but allergic  . Milk-Related Compounds     Doesn't drink milk but can eat cheese   . Other Other (See Comments)    Gelcaps; gel-containing capsules; extended-release medication - upset stomach   . Peanut-Containing Drug Products Other (See Comments)    From allergy test  . Penicillins Itching    Has patient had a PCN reaction causing immediate rash, facial/tongue/throat swelling, SOB or lightheadedness with hypotension: No Has patient had a PCN reaction causing severe rash involving mucus membranes or skin necrosis: No Has patient had a PCN reaction that required hospitalization No Has patient had a PCN reaction occurring within the last 10 years: No If all of the above answers are "NO", then may proceed with Cephalosporin use.  Marland Kitchen Shellfish-Derived Products Other (See Comments)    From allergy test  . Strawberry Extract Other (See Comments)    From allergy test       Outpatient Medications Prior to Visit  Medication Sig Dispense Refill  . acetaminophen (TYLENOL) 325 MG tablet Take 2 tablets (650 mg total) by mouth every 6 (six) hours as needed for headache. (Patient taking differently: Take 325 mg by mouth every 6 (six) hours as needed for headache or mild pain.)    . amLODipine (NORVASC) 10 MG tablet Take 1 tablet by mouth once daily 90 tablet 0  . Benralizumab (FASENRA PEN) 30 MG/ML SOAJ Inject into the skin every 8 (eight) weeks.    . budesonide-formoterol (SYMBICORT) 80-4.5 MCG/ACT inhaler Inhale 2 puffs into the lungs 2 (two) times daily. Use for asthma flares 2 puffs twice a day for 2 weeks or until cough and wheeze free (Patient taking differently: Inhale 2 puffs into the lungs 2 (two) times daily as needed (for asthma flares, for 2 weeks, or until cough and wheeze-free).) 10.2 g 5  . Cholecalciferol (VITAMIN D3) 50 MCG (2000 UT) TABS Take 2,000 Units by mouth daily.    Marland Kitchen EPINEPHrine (EPIPEN 2-PAK) 0.3 mg/0.3 mL IJ SOAJ injection Use as directed for severe allergic reaction (Patient taking differently: Inject 0.3 mg into the muscle as needed for anaphylaxis.) 2 each 1  . ipratropium (ATROVENT) 0.06 % nasal spray Place 2 sprays into both nostrils 3 (three) times daily. 15 mL 3  . linaclotide (LINZESS) 72 MCG capsule TAKE 1 CAPSULE BY MOUTH ONCE DAILY AS NEEDED (Patient taking differently: Take 72 mcg by mouth daily as needed (constipation).) 30 capsule 0  . loratadine (CLARITIN) 10 MG tablet Take 10 mg by mouth daily.    Marland Kitchen losartan (COZAAR) 100 MG tablet Take 100 mg by mouth daily.    . Magnesium Hydroxide (PHILLIPS MILK OF MAGNESIA PO) Take 15-30 mLs by mouth daily as needed (for constipation).    . mometasone-formoterol (DULERA) 100-5 MCG/ACT AERO Inhale 2 puffs into the lungs in the morning and at bedtime. 13 g 2  . montelukast (SINGULAIR) 10 MG tablet Take 1 tablet (10 mg total) by mouth at bedtime. 30 tablet 5  . pravastatin (PRAVACHOL) 80 MG tablet  Take 1 tablet (80 mg total) by mouth daily. 90 tablet 3  . prochlorperazine (COMPAZINE) 10 MG tablet Take 1 tablet (10 mg total) by mouth every 6 (six) hours as needed for nausea or vomiting. 30 tablet 0  . sucralfate (CARAFATE) 1 g tablet Take 1 tablet (1 g total) by mouth 4 (four) times daily -  with meals and at  bedtime. Crush and dissolve in 10 mL of warm water prior to swallowing 120 tablet 1  . SYMBICORT 80-4.5 MCG/ACT inhaler Inhale 2 puffs into the lungs in the morning and at bedtime. 10.2 g 5  . traMADol (ULTRAM) 50 MG tablet Take 1 tablet (50 mg total) by mouth every 6 (six) hours as needed for moderate pain. 30 tablet 2  . vitamin B-12 (CYANOCOBALAMIN) 1000 MCG tablet Take 1,000 mcg by mouth daily.    . mometasone-formoterol (DULERA) 100-5 MCG/ACT AERO Inhale 1 puff into the lungs daily. (Patient not taking: Reported on 08/22/2020)    . telmisartan (MICARDIS) 80 MG tablet Take 1 tablet (80 mg total) by mouth daily. (Patient not taking: Reported on 08/22/2020) 90 tablet 3   Facility-Administered Medications Prior to Visit  Medication Dose Route Frequency Provider Last Rate Last Admin  . Benralizumab SOSY 30 mg  30 mg Subcutaneous Q8 Weeks Kennith Gain, MD   30 mg at 07/05/20 1126    Review of Systems  Constitutional: Negative for chills, fever, malaise/fatigue and weight loss.  HENT: Negative for hearing loss, sore throat and tinnitus.   Eyes: Negative for blurred vision and double vision.  Respiratory: Positive for shortness of breath. Negative for cough, hemoptysis, sputum production, wheezing and stridor.   Cardiovascular: Negative for chest pain, palpitations, orthopnea, leg swelling and PND.  Gastrointestinal: Negative for abdominal pain, constipation, diarrhea, heartburn, nausea and vomiting.  Genitourinary: Negative for dysuria, hematuria and urgency.  Musculoskeletal: Negative for joint pain and myalgias.  Skin: Negative for itching and rash.  Neurological: Negative  for dizziness, tingling, weakness and headaches.  Endo/Heme/Allergies: Negative for environmental allergies. Does not bruise/bleed easily.  Psychiatric/Behavioral: Negative for depression. The patient is not nervous/anxious and does not have insomnia.   All other systems reviewed and are negative.    Objective:  Physical Exam Vitals reviewed.  Constitutional:      General: She is not in acute distress.    Appearance: She is well-developed.  HENT:     Head: Normocephalic and atraumatic.  Eyes:     General: No scleral icterus.    Conjunctiva/sclera: Conjunctivae normal.     Pupils: Pupils are equal, round, and reactive to light.  Neck:     Vascular: No JVD.     Trachea: No tracheal deviation.  Cardiovascular:     Rate and Rhythm: Normal rate and regular rhythm.     Heart sounds: Normal heart sounds. No murmur heard.   Pulmonary:     Effort: Pulmonary effort is normal. No tachypnea, accessory muscle usage or respiratory distress.     Breath sounds: No stridor. No wheezing, rhonchi or rales.  Abdominal:     General: Bowel sounds are normal. There is no distension.     Palpations: Abdomen is soft.     Tenderness: There is no abdominal tenderness.  Musculoskeletal:        General: No tenderness.     Cervical back: Neck supple.  Lymphadenopathy:     Cervical: No cervical adenopathy.  Skin:    General: Skin is warm and dry.     Capillary Refill: Capillary refill takes less than 2 seconds.     Findings: No rash.  Neurological:     Mental Status: She is alert and oriented to person, place, and time.  Psychiatric:        Behavior: Behavior normal.      Vitals:   08/22/20 0916  BP: 114/70  Pulse: 88  Temp: 97.6 F (  36.4 C)  SpO2: 98%  Weight: 158 lb (71.7 kg)  Height: 5\' 5"  (1.651 m)   98% on RA BMI Readings from Last 3 Encounters:  08/22/20 26.29 kg/m  08/08/20 26.39 kg/m  08/01/20 26.88 kg/m   Wt Readings from Last 3 Encounters:  08/22/20 158 lb (71.7 kg)   08/08/20 158 lb 9.6 oz (71.9 kg)  08/01/20 161 lb 8 oz (73.3 kg)     CBC    Component Value Date/Time   WBC 2.0 (L) 08/08/2020 0804   WBC 12.8 (H) 05/30/2020 0340   RBC 3.52 (L) 08/08/2020 0804   HGB 10.6 (L) 08/08/2020 0804   HGB 13.1 04/09/2017 1721   HCT 30.9 (L) 08/08/2020 0804   HCT 39.8 04/09/2017 1721   PLT 116 (L) 08/08/2020 0804   PLT 355 04/09/2017 1721   MCV 87.8 08/08/2020 0804   MCV 91 04/09/2017 1721   MCH 30.1 08/08/2020 0804   MCHC 34.3 08/08/2020 0804   RDW 13.6 08/08/2020 0804   RDW 14.0 04/09/2017 1721   LYMPHSABS 0.3 (L) 08/08/2020 0804   LYMPHSABS 3.6 (H) 04/09/2017 1721   MONOABS 0.2 08/08/2020 0804   EOSABS 0.0 08/08/2020 0804   EOSABS 0.7 (H) 04/09/2017 1721   BASOSABS 0.0 08/08/2020 0804   BASOSABS 0.0 04/09/2017 1721    Chest Imaging: CT chest 2020-05-17: Patient found to have left lower lobe 3 cm lung mass with associated mediastinal adenopathy concerning for primary bronchogenic carcinoma and advanced age. Images reviewed today in the office.The patient's images have been independently reviewed by me.    Pulmonary Functions Testing Results: No flowsheet data found.  FeNO:   Pathology:   06/10/2020:  FINAL MICROSCOPIC DIAGNOSIS:  D. LYMPH NODE, 7 NODE, FINE NEEDLE ASPIRATION:  - Malignant cells consistent with non-small cell carcinoma. The patient's path have been reviewed by me.    Echocardiogram:   Heart Catheterization:    Assessment & Plan:     ICD-10-CM   1. Adenocarcinoma of left lung, stage 3 (HCC)  C34.92   2. History of radiation therapy  Z92.3   3. Asthma, severe persistent, well-controlled  J45.50     Discussion:  80 year old female, left upper lobe mass with mediastinal adenopathy status post endobronchial ultrasound and video bronchoscopy for tissue diagnosis of adenocarcinoma.  Diagnosis of stage IIIb (T2a, N3, M0) non-small cell lung cancer adenocarcinoma.  Status post carboplatinum plus paclitaxel x5 cycles.   Also completing radiation therapy.  She has severe asthma at baseline on Fasenra plus Symbicort.  As needed albuterol  Plan: Follow-up with medical oncology. Call us if there is any changes in respiratory status. Current inhaler and Berna Bue are prescribed by allergist. Restaging PET scan has already been ordered. Patient can follow-up with [place] in 6 months or as needed. Continue current regimen.    Current Outpatient Medications:  .  acetaminophen (TYLENOL) 325 MG tablet, Take 2 tablets (650 mg total) by mouth every 6 (six) hours as needed for headache. (Patient taking differently: Take 325 mg by mouth every 6 (six) hours as needed for headache or mild pain.), Disp: , Rfl:  .  amLODipine (NORVASC) 10 MG tablet, Take 1 tablet by mouth once daily, Disp: 90 tablet, Rfl: 0 .  Benralizumab (FASENRA PEN) 30 MG/ML SOAJ, Inject into the skin every 8 (eight) weeks., Disp: , Rfl:  .  budesonide-formoterol (SYMBICORT) 80-4.5 MCG/ACT inhaler, Inhale 2 puffs into the lungs 2 (two) times daily. Use for asthma flares 2 puffs twice a day for 2  weeks or until cough and wheeze free (Patient taking differently: Inhale 2 puffs into the lungs 2 (two) times daily as needed (for asthma flares, for 2 weeks, or until cough and wheeze-free).), Disp: 10.2 g, Rfl: 5 .  Cholecalciferol (VITAMIN D3) 50 MCG (2000 UT) TABS, Take 2,000 Units by mouth daily., Disp: , Rfl:  .  EPINEPHrine (EPIPEN 2-PAK) 0.3 mg/0.3 mL IJ SOAJ injection, Use as directed for severe allergic reaction (Patient taking differently: Inject 0.3 mg into the muscle as needed for anaphylaxis.), Disp: 2 each, Rfl: 1 .  ipratropium (ATROVENT) 0.06 % nasal spray, Place 2 sprays into both nostrils 3 (three) times daily., Disp: 15 mL, Rfl: 3 .  linaclotide (LINZESS) 72 MCG capsule, TAKE 1 CAPSULE BY MOUTH ONCE DAILY AS NEEDED (Patient taking differently: Take 72 mcg by mouth daily as needed (constipation).), Disp: 30 capsule, Rfl: 0 .  loratadine (CLARITIN) 10 MG  tablet, Take 10 mg by mouth daily., Disp: , Rfl:  .  losartan (COZAAR) 100 MG tablet, Take 100 mg by mouth daily., Disp: , Rfl:  .  Magnesium Hydroxide (PHILLIPS MILK OF MAGNESIA PO), Take 15-30 mLs by mouth daily as needed (for constipation)., Disp: , Rfl:  .  mometasone-formoterol (DULERA) 100-5 MCG/ACT AERO, Inhale 2 puffs into the lungs in the morning and at bedtime., Disp: 13 g, Rfl: 2 .  montelukast (SINGULAIR) 10 MG tablet, Take 1 tablet (10 mg total) by mouth at bedtime., Disp: 30 tablet, Rfl: 5 .  pravastatin (PRAVACHOL) 80 MG tablet, Take 1 tablet (80 mg total) by mouth daily., Disp: 90 tablet, Rfl: 3 .  prochlorperazine (COMPAZINE) 10 MG tablet, Take 1 tablet (10 mg total) by mouth every 6 (six) hours as needed for nausea or vomiting., Disp: 30 tablet, Rfl: 0 .  sucralfate (CARAFATE) 1 g tablet, Take 1 tablet (1 g total) by mouth 4 (four) times daily -  with meals and at bedtime. Crush and dissolve in 10 mL of warm water prior to swallowing, Disp: 120 tablet, Rfl: 1 .  SYMBICORT 80-4.5 MCG/ACT inhaler, Inhale 2 puffs into the lungs in the morning and at bedtime., Disp: 10.2 g, Rfl: 5 .  traMADol (ULTRAM) 50 MG tablet, Take 1 tablet (50 mg total) by mouth every 6 (six) hours as needed for moderate pain., Disp: 30 tablet, Rfl: 2 .  vitamin B-12 (CYANOCOBALAMIN) 1000 MCG tablet, Take 1,000 mcg by mouth daily., Disp: , Rfl:  .  mometasone-formoterol (DULERA) 100-5 MCG/ACT AERO, Inhale 1 puff into the lungs daily. (Patient not taking: Reported on 08/22/2020), Disp: , Rfl:  .  telmisartan (MICARDIS) 80 MG tablet, Take 1 tablet (80 mg total) by mouth daily. (Patient not taking: Reported on 08/22/2020), Disp: 90 tablet, Rfl: 3  Current Facility-Administered Medications:  .  Benralizumab SOSY 30 mg, 30 mg, Subcutaneous, Q8 Weeks, Kennith Gain, MD, 30 mg at 07/05/20 [place], DO [facility] 08/22/2020 9:30 AM

## 2020-08-22 NOTE — Patient Instructions (Signed)
Thank you for visiting Dr. Valeta Harms at [facility]. Today we recommend the following:  Continue current inhaler regimen  Return in about 6 months (around 02/21/2021).    Please do your part to reduce the spread of COVID-19.

## 2020-08-24 ENCOUNTER — Ambulatory Visit: Payer: Medicare HMO | Admitting: Pulmonary Disease

## 2020-08-25 ENCOUNTER — Telehealth: Payer: Self-pay | Admitting: Internal Medicine

## 2020-08-25 NOTE — Telephone Encounter (Signed)
Angel with [facility] calling, making [place] aware she cannot get in touch with the patient so she is going to cancels this week appointment and continue next week.

## 2020-08-29 ENCOUNTER — Inpatient Hospital Stay: Payer: Medicare HMO | Attending: Internal Medicine

## 2020-08-29 ENCOUNTER — Inpatient Hospital Stay: Payer: Medicare HMO

## 2020-08-29 ENCOUNTER — Encounter (HOSPITAL_COMMUNITY): Payer: Self-pay

## 2020-08-29 ENCOUNTER — Telehealth: Payer: Self-pay | Admitting: Medical Oncology

## 2020-08-29 ENCOUNTER — Ambulatory Visit (HOSPITAL_COMMUNITY)
Admission: RE | Admit: 2020-08-29 | Discharge: 2020-08-29 | Disposition: A | Discharge status: Home / Self Care | Payer: Medicare HMO | Source: Ambulatory Visit | Attending: Internal Medicine | Admitting: Internal Medicine

## 2020-08-29 ENCOUNTER — Other Ambulatory Visit: Payer: Self-pay

## 2020-08-29 DIAGNOSIS — I700 Atherosclerosis of aorta: Secondary | ICD-10-CM | POA: Insufficient documentation

## 2020-08-29 DIAGNOSIS — C3490 Malignant neoplasm of unspecified part of unspecified bronchus or lung: Secondary | ICD-10-CM | POA: Diagnosis not present

## 2020-08-29 DIAGNOSIS — Z79899 Other long term (current) drug therapy: Secondary | ICD-10-CM | POA: Insufficient documentation

## 2020-08-29 DIAGNOSIS — Z95828 Presence of other vascular implants and grafts: Secondary | ICD-10-CM

## 2020-08-29 DIAGNOSIS — C778 Secondary and unspecified malignant neoplasm of lymph nodes of multiple regions: Secondary | ICD-10-CM | POA: Diagnosis not present

## 2020-08-29 DIAGNOSIS — I10 Essential (primary) hypertension: Secondary | ICD-10-CM | POA: Diagnosis not present

## 2020-08-29 DIAGNOSIS — J45909 Unspecified asthma, uncomplicated: Secondary | ICD-10-CM | POA: Insufficient documentation

## 2020-08-29 DIAGNOSIS — I2510 Atherosclerotic heart disease of native coronary artery without angina pectoris: Secondary | ICD-10-CM | POA: Insufficient documentation

## 2020-08-29 DIAGNOSIS — R1013 Epigastric pain: Secondary | ICD-10-CM | POA: Insufficient documentation

## 2020-08-29 DIAGNOSIS — R5383 Other fatigue: Secondary | ICD-10-CM | POA: Diagnosis not present

## 2020-08-29 DIAGNOSIS — K219 Gastro-esophageal reflux disease without esophagitis: Secondary | ICD-10-CM | POA: Diagnosis not present

## 2020-08-29 DIAGNOSIS — E785 Hyperlipidemia, unspecified: Secondary | ICD-10-CM | POA: Insufficient documentation

## 2020-08-29 DIAGNOSIS — Z5112 Encounter for antineoplastic immunotherapy: Secondary | ICD-10-CM | POA: Insufficient documentation

## 2020-08-29 DIAGNOSIS — C3432 Malignant neoplasm of lower lobe, left bronchus or lung: Secondary | ICD-10-CM | POA: Insufficient documentation

## 2020-08-29 LAB — CBC WITH DIFFERENTIAL (CANCER CENTER ONLY)
Abs Immature Granulocytes: 0.01 10*3/uL (ref 0.00–0.07)
Basophils Absolute: 0 10*3/uL (ref 0.0–0.1)
Basophils Relative: 1 %
Eosinophils Absolute: 0 10*3/uL (ref 0.0–0.5)
Eosinophils Relative: 0 %
HCT: 31.8 % — ABNORMAL LOW (ref 36.0–46.0)
Hemoglobin: 10.4 g/dL — ABNORMAL LOW (ref 12.0–15.0)
Immature Granulocytes: 0 %
Lymphocytes Relative: 35 %
Lymphs Abs: 1.6 10*3/uL (ref 0.7–4.0)
MCH: 30.1 pg (ref 26.0–34.0)
MCHC: 32.7 g/dL (ref 30.0–36.0)
MCV: 91.9 fL (ref 80.0–100.0)
Monocytes Absolute: 0.6 10*3/uL (ref 0.1–1.0)
Monocytes Relative: 12 %
Neutro Abs: 2.4 10*3/uL (ref 1.7–7.7)
Neutrophils Relative %: 52 %
Platelet Count: 209 10*3/uL (ref 150–400)
RBC: 3.46 MIL/uL — ABNORMAL LOW (ref 3.87–5.11)
RDW: 16.9 % — ABNORMAL HIGH (ref 11.5–15.5)
WBC Count: 4.6 10*3/uL (ref 4.0–10.5)
nRBC: 0 % (ref 0.0–0.2)

## 2020-08-29 LAB — CMP (CANCER CENTER ONLY)
ALT: 13 U/L (ref 0–44)
AST: 21 U/L (ref 15–41)
Albumin: 3.4 g/dL — ABNORMAL LOW (ref 3.5–5.0)
Alkaline Phosphatase: 58 U/L (ref 38–126)
Anion gap: 11 (ref 5–15)
BUN: 9 mg/dL (ref 8–23)
CO2: 24 mmol/L (ref 22–32)
Calcium: 8.9 mg/dL (ref 8.9–10.3)
Chloride: 107 mmol/L (ref 98–111)
Creatinine: 0.8 mg/dL (ref 0.44–1.00)
GFR, Estimated: >60 mL/min (ref >60)
Glucose, Bld: 97 mg/dL (ref 70–99)
Potassium: 3.2 mmol/L — ABNORMAL LOW (ref 3.5–5.1)
Sodium: 142 mmol/L (ref 135–145)
Total Bilirubin: 0.4 mg/dL (ref 0.3–1.2)
Total Protein: 6.4 g/dL — ABNORMAL LOW (ref 6.5–8.1)

## 2020-08-29 MED ORDER — SODIUM CHLORIDE 0.9% FLUSH
10.0000 mL | Freq: Once | INTRAVENOUS | Status: AC
  Administered 2020-08-29: 10 mL via INTRAVENOUS
  Filled 2020-08-29: qty 10

## 2020-08-29 MED ORDER — HEPARIN SOD (PORK) LOCK FLUSH 100 UNIT/ML IV SOLN
500.0000 [IU] | Freq: Once | INTRAVENOUS | Status: AC
  Administered 2020-08-29: 500 [IU]

## 2020-08-29 MED ORDER — HEPARIN SOD (PORK) LOCK FLUSH 100 UNIT/ML IV SOLN
INTRAVENOUS | Status: AC
  Filled 2020-08-29: qty 5

## 2020-08-29 MED ORDER — IOHEXOL 300 MG/ML  SOLN
75.0000 mL | Freq: Once | INTRAMUSCULAR | Status: AC | PRN
  Administered 2020-08-29: 75 mL via INTRAVENOUS

## 2020-08-29 NOTE — Telephone Encounter (Signed)
Dtr instructed to tell pt to increase K+ rich foods in her diet.

## 2020-08-29 NOTE — Patient Instructions (Signed)

## 2020-08-30 ENCOUNTER — Encounter: Payer: Self-pay | Admitting: Radiation Oncology

## 2020-08-30 ENCOUNTER — Ambulatory Visit (INDEPENDENT_AMBULATORY_CARE_PROVIDER_SITE_OTHER): Payer: Medicare HMO | Admitting: *Deleted

## 2020-08-30 DIAGNOSIS — J4550 Severe persistent asthma, uncomplicated: Secondary | ICD-10-CM

## 2020-08-31 ENCOUNTER — Telehealth: Payer: Self-pay | Admitting: Internal Medicine

## 2020-08-31 ENCOUNTER — Encounter: Payer: Self-pay | Admitting: Internal Medicine

## 2020-08-31 ENCOUNTER — Inpatient Hospital Stay (HOSPITAL_BASED_OUTPATIENT_CLINIC_OR_DEPARTMENT_OTHER): Payer: Medicare HMO | Admitting: Internal Medicine

## 2020-08-31 ENCOUNTER — Other Ambulatory Visit: Payer: Self-pay

## 2020-08-31 VITALS — BP 136/73 | HR 96 | Temp 97.3°F | Resp 18 | Ht 65.0 in | Wt 157.6 lb

## 2020-08-31 DIAGNOSIS — R1013 Epigastric pain: Secondary | ICD-10-CM | POA: Diagnosis not present

## 2020-08-31 DIAGNOSIS — J45909 Unspecified asthma, uncomplicated: Secondary | ICD-10-CM | POA: Diagnosis not present

## 2020-08-31 DIAGNOSIS — C778 Secondary and unspecified malignant neoplasm of lymph nodes of multiple regions: Secondary | ICD-10-CM | POA: Diagnosis not present

## 2020-08-31 DIAGNOSIS — R5383 Other fatigue: Secondary | ICD-10-CM | POA: Diagnosis not present

## 2020-08-31 DIAGNOSIS — E785 Hyperlipidemia, unspecified: Secondary | ICD-10-CM | POA: Diagnosis not present

## 2020-08-31 DIAGNOSIS — I10 Essential (primary) hypertension: Secondary | ICD-10-CM | POA: Diagnosis not present

## 2020-08-31 DIAGNOSIS — Z5112 Encounter for antineoplastic immunotherapy: Secondary | ICD-10-CM | POA: Diagnosis not present

## 2020-08-31 DIAGNOSIS — C3432 Malignant neoplasm of lower lobe, left bronchus or lung: Secondary | ICD-10-CM | POA: Diagnosis not present

## 2020-08-31 DIAGNOSIS — C3492 Malignant neoplasm of unspecified part of left bronchus or lung: Secondary | ICD-10-CM | POA: Diagnosis not present

## 2020-08-31 DIAGNOSIS — E876 Hypokalemia: Secondary | ICD-10-CM | POA: Diagnosis not present

## 2020-08-31 DIAGNOSIS — K219 Gastro-esophageal reflux disease without esophagitis: Secondary | ICD-10-CM | POA: Diagnosis not present

## 2020-08-31 MED ORDER — POTASSIUM CHLORIDE CRYS ER 20 MEQ PO TBCR: 20.0000 meq | EXTENDED_RELEASE_TABLET | Freq: Every day | ORAL | 0 refills | Status: DC

## 2020-08-31 NOTE — Progress Notes (Signed)
[name] Telephone:([phone]) [phone]   [phone]  OFFICE PROGRESS NOTE  Angelica Borg, MD [place] [id number]  DIAGNOSIS: Stage IIIB (T2 a, N3, M0) non-small cell lung cancer, adenocarcinoma presented with left lower lobe lung mass in addition to left hilar and mediastinal as well as right and left cervical lymphadenopathy diagnosed in January 2022  PRIOR THERAPY: Concurrent chemoradiation with weekly carboplatin for AUC of 2 and paclitaxel 45 mg/M2.  Status post 5 cycles.  Last dose was given August 01, 2020.  CURRENT THERAPY: Consolidation treatment with immunotherapy with Imfinzi 1500 mg IV every 4 weeks.  First dose September 06, 2020.   INTERVAL HISTORY: Angelica [name] 80 y.o. female returns to the clinic today for follow-up visit accompanied by her daughter.  The patient is feeling fine today with no concerning complaints except for intermittent epigastric pain.  She denied having any current chest pain, shortness of breath, cough or hemoptysis.  She denied having any fever or chills.  She has no nausea, vomiting, diarrhea or constipation.  She has no headache or visual changes.  She tolerated her course of concurrent chemoradiation fairly well.  The patient had repeat CT scan of the chest performed recently and she is here for evaluation and discussion of her scan results.  MEDICAL HISTORY: Past Medical History:  Diagnosis Date  . Arthritis   . Asthma   . Bowel obstruction ([place])   . Environmental allergies   . GERD (gastroesophageal reflux disease)   . Glaucoma 05/09/2017  . H. pylori infection   . History of radiation therapy 07/05/20-08/05/20   Left lung IMRT Dr. Sondra Come   . HLD (hyperlipidemia) 01/16/2019  . Hypertension   . Iron deficiency anemia   . Osteopenia 05/26/2017  . Thyroid disease   . Urticaria 07/25/2018    ALLERGIES:  is allergic to asa buff (mag [buffered aspirin], ensure [nutritional supplements], fish allergy,  milk-related compounds, other, peanut-containing drug products, penicillins, shellfish-derived products, and strawberry extract.  MEDICATIONS:  Current Outpatient Medications  Medication Sig Dispense Refill  . acetaminophen (TYLENOL) 325 MG tablet Take 2 tablets (650 mg total) by mouth every 6 (six) hours as needed for headache. (Patient taking differently: Take 325 mg by mouth every 6 (six) hours as needed for headache or mild pain.)    . amLODipine (NORVASC) 10 MG tablet Take 1 tablet by mouth once daily 90 tablet 0  . Benralizumab (FASENRA PEN) 30 MG/ML SOAJ Inject into the skin every 8 (eight) weeks.    . budesonide-formoterol (SYMBICORT) 80-4.5 MCG/ACT inhaler Inhale 2 puffs into the lungs 2 (two) times daily. Use for asthma flares 2 puffs twice a day for 2 weeks or until cough and wheeze free (Patient taking differently: Inhale 2 puffs into the lungs 2 (two) times daily as needed (for asthma flares, for 2 weeks, or until cough and wheeze-free).) 10.2 g 5  . Cholecalciferol (VITAMIN D3) 50 MCG (2000 UT) TABS Take 2,000 Units by mouth daily.    Marland Kitchen EPINEPHrine (EPIPEN 2-PAK) 0.3 mg/0.3 mL IJ SOAJ injection Use as directed for severe allergic reaction (Patient taking differently: Inject 0.3 mg into the muscle as needed for anaphylaxis.) 2 each 1  . ipratropium (ATROVENT) 0.06 % nasal spray Place 2 sprays into both nostrils 3 (three) times daily. 15 mL 3  . linaclotide (LINZESS) 72 MCG capsule TAKE 1 CAPSULE BY MOUTH ONCE DAILY AS NEEDED (Patient taking differently: Take 72 mcg by mouth  daily as needed (constipation).) 30 capsule 0  . loratadine (CLARITIN) 10 MG tablet Take 10 mg by mouth daily.    Marland Kitchen losartan (COZAAR) 100 MG tablet Take 100 mg by mouth daily.    . Magnesium Hydroxide (PHILLIPS MILK OF MAGNESIA PO) Take 15-30 mLs by mouth daily as needed (for constipation).    . montelukast (SINGULAIR) 10 MG tablet Take 1 tablet (10 mg total) by mouth at bedtime. 30 tablet 5  . pravastatin  (PRAVACHOL) 80 MG tablet Take 1 tablet (80 mg total) by mouth daily. 90 tablet 3  . prochlorperazine (COMPAZINE) 10 MG tablet Take 1 tablet (10 mg total) by mouth every 6 (six) hours as needed for nausea or vomiting. 30 tablet 0  . sucralfate (CARAFATE) 1 g tablet Take 1 tablet (1 g total) by mouth 4 (four) times daily -  with meals and at bedtime. Crush and dissolve in 10 mL of warm water prior to swallowing 120 tablet 1  . SYMBICORT 80-4.5 MCG/ACT inhaler Inhale 2 puffs into the lungs in the morning and at bedtime. 10.2 g 5  . telmisartan (MICARDIS) 80 MG tablet Take 1 tablet (80 mg total) by mouth daily. (Patient not taking: No sig reported) 90 tablet 3  . traMADol (ULTRAM) 50 MG tablet Take 1 tablet (50 mg total) by mouth every 6 (six) hours as needed for moderate pain. 30 tablet 2  . vitamin B-12 (CYANOCOBALAMIN) 1000 MCG tablet Take 1,000 mcg by mouth daily.     Current Facility-Administered Medications  Medication Dose Route Frequency Provider Last Rate Last Admin  . Benralizumab SOSY 30 mg  30 mg Subcutaneous Q8 Weeks Kennith Gain, MD   30 mg at 08/30/20 1129    SURGICAL HISTORY:  Past Surgical History:  Procedure Laterality Date  . ABDOMINAL HYSTERECTOMY    . BOWEL RESECTION N/A 05/23/2020   Procedure: SMALL BOWEL REPAIR;  Surgeon: Kinsinger, Arta Bruce, MD;  Location: [place];  Service: General;  Laterality: N/A;  . BREAST BIOPSY    . BRONCHIAL BIOPSY  06/10/2020   Procedure: BRONCHIAL BIOPSIES;  Surgeon: Garner Nash, DO;  Location: [facility];  Service: Pulmonary;;  . BRONCHIAL BRUSHINGS  06/10/2020   Procedure: BRONCHIAL BRUSHINGS;  Surgeon: Garner Nash, DO;  Location: [facility];  Service: Pulmonary;;  . BRONCHIAL NEEDLE ASPIRATION BIOPSY  06/10/2020   Procedure: BRONCHIAL NEEDLE ASPIRATION BIOPSIES;  Surgeon: Garner Nash, DO;  Location: [facility];  Service: Pulmonary;;  . BRONCHIAL WASHINGS  06/10/2020   Procedure: BRONCHIAL WASHINGS;  Surgeon:  Garner Nash, DO;  Location: [facility];  Service: Pulmonary;;  . COLON SURGERY    . DIAGNOSTIC LARYNGOSCOPY N/A 05/23/2020   Procedure: ATTEMPTED DIAGNOSTIC LAPAROSCOPY WITH ADHESIONS;  Surgeon: Kieth Brightly, Arta Bruce, MD;  Location: [place];  Service: General;  Laterality: N/A;  . IR IMAGING GUIDED PORT INSERTION  07/15/2020  . KNEE SURGERY    . LAPAROTOMY N/A 04/18/2015   Procedure: Exploratory laparotomy with lysis of adhesions, possible bowel resection;  Surgeon: Ralene Ok, MD;  Location: [place];  Service: General;  Laterality: N/A;  . LAPAROTOMY N/A 05/23/2020   Procedure: EXPLORATORY LAPAROTOMY;  Surgeon: Kieth Brightly Arta Bruce, MD;  Location: [place];  Service: General;  Laterality: N/A;  . LYSIS OF ADHESION N/A 05/23/2020   Procedure: LYSIS OF ADHESION;  Surgeon: Kieth Brightly Arta Bruce, MD;  Location: [place];  Service: General;  Laterality: N/A;  . NASAL SINUS SURGERY    . THYROID SURGERY    .  VIDEO BRONCHOSCOPY WITH ENDOBRONCHIAL NAVIGATION Bilateral 06/10/2020   Procedure: VIDEO BRONCHOSCOPY WITH ENDOBRONCHIAL NAVIGATION;  Surgeon: Garner Nash, DO;  Location: [place];  Service: Pulmonary;  Laterality: Bilateral;  . VIDEO BRONCHOSCOPY WITH ENDOBRONCHIAL ULTRASOUND  06/10/2020   Procedure: VIDEO BRONCHOSCOPY WITH ENDOBRONCHIAL ULTRASOUND;  Surgeon: Garner Nash, DO;  Location: [facility];  Service: Pulmonary;;    REVIEW OF SYSTEMS:  Constitutional: positive for fatigue Eyes: negative Ears, nose, mouth, throat, and face: negative Respiratory: negative Cardiovascular: negative Gastrointestinal: negative Genitourinary:negative Integument/breast: negative Hematologic/lymphatic: negative Musculoskeletal:negative Neurological: negative Behavioral/Psych: negative Endocrine: negative Allergic/Immunologic: negative   PHYSICAL EXAMINATION: General appearance: alert, cooperative, fatigued and no distress Head: Normocephalic, without obvious abnormality, atraumatic Neck:  no adenopathy, no JVD, supple, symmetrical, trachea midline and thyroid not enlarged, symmetric, no tenderness/mass/nodules Lymph nodes: Cervical, supraclavicular, and axillary nodes normal. Resp: clear to auscultation bilaterally Back: symmetric, no curvature. ROM normal. No CVA tenderness. Cardio: regular rate and rhythm, S1, S2 normal, no murmur, click, rub or gallop GI: soft, non-tender; bowel sounds normal; no masses,  no organomegaly Extremities: extremities normal, atraumatic, no cyanosis or edema Neurologic: Alert and oriented X 3, normal strength and tone. Normal symmetric reflexes. Normal coordination and gait  ECOG PERFORMANCE STATUS: 1 - Symptomatic but completely ambulatory  Blood pressure 136/73, pulse 96, temperature (!) 97.3 F (36.3 C), temperature source Tympanic, resp. rate 18, height 5\' 5"  (1.651 m), weight 157 lb 9.6 oz (71.5 kg), SpO2 100 %.  LABORATORY DATA: Lab Results  Component Value Date   WBC 4.6 08/29/2020   HGB 10.4 (L) 08/29/2020   HCT 31.8 (L) 08/29/2020   MCV 91.9 08/29/2020   PLT 209 08/29/2020      Chemistry      Component Value Date/Time   NA 142 08/29/2020 0724   K 3.2 (L) 08/29/2020 0724   CL 107 08/29/2020 0724   CO2 24 08/29/2020 0724   BUN 9 08/29/2020 0724   CREATININE 0.80 08/29/2020 0724      Component Value Date/Time   CALCIUM 8.9 08/29/2020 0724   ALKPHOS 58 08/29/2020 0724   AST 21 08/29/2020 0724   ALT 13 08/29/2020 0724   BILITOT 0.4 08/29/2020 0724       RADIOGRAPHIC STUDIES: CT Chest W Contrast  Result Date: 08/29/2020 CLINICAL DATA:  Non-small lung cancer restaging EXAM: CT CHEST WITH CONTRAST TECHNIQUE: Multidetector CT imaging of the chest was performed during intravenous contrast administration. CONTRAST:  72mL OMNIPAQUE IOHEXOL 300 MG/ML  SOLN COMPARISON:  PET-CT, 06/17/2020, CT chest, 05/17/2020 FINDINGS: Cardiovascular: Right chest port catheter. Aortic atherosclerosis. Normal heart size. Three-vessel coronary  artery calcifications. No pericardial effusion. Mediastinum/Nodes: No persistently enlarged mediastinal, hilar, or axillary lymph nodes. Heterogeneous, multinodular thyroid. Trachea, and esophagus demonstrate no significant findings. Lungs/Pleura: Interval decrease in size of spiculated mass of the medial left lower lobe measuring 2.1 x 2.1 cm, previously 2.8 x 2.6 cm (series 5, image 103). Stable 3 mm nodule of the lateral segment right middle lobe (series 5, image 82). No pleural effusion or pneumothorax. Upper Abdomen: No acute abnormality. Musculoskeletal: No chest wall mass or suspicious bone lesions identified. IMPRESSION: 1. Interval decrease in size of spiculated mass of the medial left lower lobe. 2. No persistently enlarged mediastinal, hilar, or axillary lymph nodes. 3. Findings are consistent with response of primary lung malignancy and nodal metastatic disease. 4. Stable 3 mm nodule of the lateral segment right middle lobe, almost certainly benign and incidental. Attention on follow-up. 5. Enlarged and heterogeneous thyroid. Recommend  thyroid ultrasound if and when clinically appropriate in the setting of metastatic malignancy (ref: J Am Coll Radiol. 2015 Feb;12(2): 143-50). 6. Coronary artery disease. Aortic Atherosclerosis (ICD10-I70.0). Electronically Signed   By: Eddie Candle M.D.   On: 08/29/2020 21:41    ASSESSMENT AND PLAN: This is a very pleasant 80 years old African-American female recently diagnosed with a stage IIb (T2a, N3, M0) non-small cell lung cancer, adenocarcinoma presented with left lower lobe lung mass in addition to left hilar and mediastinal lymphadenopathy as well as right and left cervical lymphadenopathy diagnosed in January 2022. The patient underwent a course of concurrent chemoradiation with weekly carboplatin and paclitaxel status post 5 cycles.  She completed the course of radiotherapy last week. The patient tolerated the previous course of concurrent chemoradiation  fairly well. She had repeat CT scan of the chest performed recently.  I personally and independently reviewed the scan images and discussed the result and showed the images to the patient and her daughter. Her scan showed improvement of her disease consistent with response to the treatment. I recommended for the patient to proceed with the consolidation treatment with immunotherapy with Imfinzi 1500 mg IV every 4 weeks.  I discussed with the patient and her daughter the adverse effect of this treatment including but not limited to immunotherapy mediated skin rash, diarrhea, inflammation of the lung, kidney, liver, thyroid or other endocrine dysfunction. She is expected to start the first dose of this treatment next week. The patient will come back for follow-up visit in 5 weeks for evaluation before the next cycle of her treatment. For the hypokalemia, I will send prescription for potassium chloride 20 mEq p.o. daily for 7 days to her pharmacy. The patient was advised to call immediately if she has any other concerning symptoms in the interval. The patient voices understanding of current disease status and treatment options and is in agreement with the current care plan.  All questions were answered. The patient knows to call the clinic with any problems, questions or concerns. We can certainly see the patient much sooner if necessary.  Disclaimer: This note was dictated with voice recognition software. Similar sounding words can inadvertently be transcribed and may not be corrected upon review.

## 2020-08-31 NOTE — Progress Notes (Signed)
DISCONTINUE ON PATHWAY REGIMEN - Non-Small Cell Lung     Administer weekly:     Paclitaxel      Carboplatin   **Always confirm dose/schedule in your pharmacy ordering system**  REASON: Continuation Of Treatment PRIOR TREATMENT: [id number]: Carboplatin AUC=2 + Paclitaxel 45 mg/m2 Weekly During Radiation TREATMENT RESPONSE: Partial Response (PR)  START ON PATHWAY REGIMEN - Non-Small Cell Lung     A cycle is every 28 days:     Durvalumab   **Always confirm dose/schedule in your pharmacy ordering system**  Patient Characteristics: Preoperative or Nonsurgical Candidate (Clinical Staging), Stage III - Nonsurgical Candidate (Nonsquamous and Squamous), PS = 0, 1 Therapeutic Status: Preoperative or Nonsurgical Candidate (Clinical Staging) AJCC T Category: cT2a AJCC N Category: cN3 AJCC M Category: cM0 AJCC 8 Stage Grouping: IIIB ECOG Performance Status: 1 Intent of Therapy: Curative Intent, Discussed with Patient

## 2020-08-31 NOTE — Telephone Encounter (Signed)
Scheduled per los. Gave avs and calendar. Advised patient I will give her a call once next weeks infusion is approved

## 2020-09-01 DIAGNOSIS — C3432 Malignant neoplasm of lower lobe, left bronchus or lung: Secondary | ICD-10-CM | POA: Diagnosis not present

## 2020-09-01 DIAGNOSIS — K565 Intestinal adhesions [bands], unspecified as to partial versus complete obstruction: Secondary | ICD-10-CM | POA: Diagnosis not present

## 2020-09-01 DIAGNOSIS — Z48815 Encounter for surgical aftercare following surgery on the digestive system: Secondary | ICD-10-CM | POA: Diagnosis not present

## 2020-09-01 DIAGNOSIS — J4550 Severe persistent asthma, uncomplicated: Secondary | ICD-10-CM | POA: Diagnosis not present

## 2020-09-01 DIAGNOSIS — I10 Essential (primary) hypertension: Secondary | ICD-10-CM | POA: Diagnosis not present

## 2020-09-01 DIAGNOSIS — M170 Bilateral primary osteoarthritis of knee: Secondary | ICD-10-CM | POA: Diagnosis not present

## 2020-09-01 DIAGNOSIS — M19041 Primary osteoarthritis, right hand: Secondary | ICD-10-CM | POA: Diagnosis not present

## 2020-09-01 DIAGNOSIS — E049 Nontoxic goiter, unspecified: Secondary | ICD-10-CM | POA: Diagnosis not present

## 2020-09-01 DIAGNOSIS — R590 Localized enlarged lymph nodes: Secondary | ICD-10-CM | POA: Diagnosis not present

## 2020-09-02 NOTE — Progress Notes (Signed)
Pharmacist Chemotherapy Monitoring - Follow Up Assessment    I verify that I have reviewed each item in the below checklist:  . Regimen for the patient is scheduled for the appropriate day and plan matches scheduled date. Marland Kitchen Appropriate non-routine labs are ordered dependent on drug ordered. . If applicable, additional medications reviewed and ordered per protocol based on lifetime cumulative doses and/or treatment regimen.   Plan for follow-up and/or issues identified: No . I-vent associated with next due treatment: No . MD and/or nursing notified: No   Kennith Center, Pharm.D., CPP 09/02/2020@2 :07 PM

## 2020-09-04 NOTE — Progress Notes (Incomplete)
  Patient Name: Angelica [name] MRN: [id number] DOB: [birth year] Referring Physician: Curt Bears (Profile Not Attached) Date of Service: 08/05/2020 [facility], [place]                                                        End Of Treatment Note  Diagnoses: C34.32-Malignant neoplasm of lower lobe, left bronchus or lung  Cancer Staging: Stage IIIB (T2a, N3, M0) non-small cell left lower lobe lung cancer, adenocarcinoma   Intent: Curative  Radiation Treatment Dates: 07/05/2020 through 08/05/2020  Site: Left lung Technique: IMRT Total Dose (Gy): 48/48 Dose per Fx (Gy): 2 Completed Fx: 24/24 Beam Energies: 6X  Narrative: The patient tolerated radiation therapy quite well. She did report mild fatigue and mild pain/discomfort when swallowing hard foods. She denied shortness of breath, cough, significant change in appetite, pain, headache, dizziness, nausea, vomiting, and skin changes.  Plan: The patient will follow-up with radiation oncology in one month.  ________________________________________________   Blair Promise, PhD, MD  This document serves as a record of services personally performed by Gery Pray, MD. It was created on his behalf by Clerance Lav, a trained medical scribe. The creation of this record is based on the scribe's personal observations and the provider's statements to them. This document has been checked and approved by the attending provider.

## 2020-09-04 NOTE — Progress Notes (Signed)
Radiation Oncology         [phone] ________________________________  Name: Angelica [name] MRN: [id number]  Date: 09/05/2020  DOB: [birth year]  Follow-Up Visit Note  CC: [name], MD  Curt Bears, MD    ICD-10-CM   1. Adenocarcinoma of left lung, stage 3 (HCC)  C34.92     Diagnosis: Stage IIIB (T2a, N3, M0) non-small cell left lower lobe lung cancer, adenocarcinoma  Interval Since Last Radiation: One month  Radiation Treatment Dates: 07/05/2020 through 08/05/2020  Site: Left lung Technique: IMRT Total Dose (Gy): 48/48 Dose per Fx (Gy): 2 Completed Fx: 24/24 Beam Energies: 6X  Narrative:  The patient returns today for routine follow-up. Since the end of treatment, she underwent a chest CT scan on 08/29/2020 that showed interval decrease in size of the spiculated mass of the medial left lower lobe. There were no persistently enlarged mediastinal, hilar, or axillary lymph nodes. Findings were consistent with response of primary lung malignancy and nodal metastatic disease. The 3 mm nodule of the lateral segment of the right middle lobe was stable and almost certainly benign and incidental.  The patient was last seen by Dr. [name] on 08/31/2020, at which time it was recommended that she proceed with consolidation treatment with immunotherapy with Imfinzi. First dose is tentatively scheduled for tomorrow, 09/06/2020.  On review of systems, she reports feeling well.  She denies any significant cough or hemoptysis. She denies swallowing difficulties.  ALLERGIES:  is allergic to asa buff (mag [buffered aspirin], ensure [nutritional supplements], fish allergy, milk-related compounds, other, peanut-containing drug products, penicillins, shellfish-derived products, and strawberry extract.  Meds: Current Outpatient Medications  Medication Sig Dispense Refill  . acetaminophen (TYLENOL) 325 MG tablet Take 2 tablets (650 mg total) by mouth every 6 (six) hours as needed for  headache. (Patient taking differently: Take 325 mg by mouth every 6 (six) hours as needed for headache or mild pain.)    . amLODipine (NORVASC) 10 MG tablet Take 1 tablet by mouth once daily 90 tablet 0  . Benralizumab (FASENRA PEN) 30 MG/ML SOAJ Inject into the skin every 8 (eight) weeks.    . budesonide-formoterol (SYMBICORT) 80-4.5 MCG/ACT inhaler Inhale 2 puffs into the lungs 2 (two) times daily. Use for asthma flares 2 puffs twice a day for 2 weeks or until cough and wheeze free (Patient taking differently: Inhale 2 puffs into the lungs 2 (two) times daily as needed (for asthma flares, for 2 weeks, or until cough and wheeze-free).) 10.2 g 5  . Cholecalciferol (VITAMIN D3) 50 MCG (2000 UT) TABS Take 2,000 Units by mouth daily.    Marland Kitchen EPINEPHrine (EPIPEN 2-PAK) 0.3 mg/0.3 mL IJ SOAJ injection Use as directed for severe allergic reaction (Patient taking differently: Inject 0.3 mg into the muscle as needed for anaphylaxis.) 2 each 1  . ipratropium (ATROVENT) 0.06 % nasal spray Place 2 sprays into both nostrils 3 (three) times daily. 15 mL 3  . linaclotide (LINZESS) 72 MCG capsule TAKE 1 CAPSULE BY MOUTH ONCE DAILY AS NEEDED (Patient taking differently: Take 72 mcg by mouth daily as needed (constipation).) 30 capsule 0  . loratadine (CLARITIN) 10 MG tablet Take 10 mg by mouth daily.    Marland Kitchen losartan (COZAAR) 100 MG tablet Take 100 mg by mouth daily.    . Magnesium Hydroxide (PHILLIPS MILK OF MAGNESIA PO) Take 15-30 mLs by mouth daily as needed (for constipation).    . montelukast (SINGULAIR) 10 MG tablet Take 1 tablet (10 mg total) by  mouth at bedtime. 30 tablet 5  . pravastatin (PRAVACHOL) 80 MG tablet Take 1 tablet (80 mg total) by mouth daily. 90 tablet 3  . prochlorperazine (COMPAZINE) 10 MG tablet Take 1 tablet (10 mg total) by mouth every 6 (six) hours as needed for nausea or vomiting. 30 tablet 0  . sucralfate (CARAFATE) 1 g tablet Take 1 tablet (1 g total) by mouth 4 (four) times daily -  with meals  and at bedtime. Crush and dissolve in 10 mL of warm water prior to swallowing 120 tablet 1  . SYMBICORT 80-4.5 MCG/ACT inhaler Inhale 2 puffs into the lungs in the morning and at bedtime. 10.2 g 5  . telmisartan (MICARDIS) 80 MG tablet Take 1 tablet (80 mg total) by mouth daily. 90 tablet 3  . traMADol (ULTRAM) 50 MG tablet Take 1 tablet (50 mg total) by mouth every 6 (six) hours as needed for moderate pain. 30 tablet 2  . vitamin B-12 (CYANOCOBALAMIN) 1000 MCG tablet Take 1,000 mcg by mouth daily.    . potassium chloride SA (KLOR-CON) 20 MEQ tablet Take 1 tablet (20 mEq total) by mouth daily. (Patient not taking: Reported on 09/05/2020) 7 tablet 0   Current Facility-Administered Medications  Medication Dose Route Frequency Provider Last Rate Last Admin  . Benralizumab SOSY 30 mg  30 mg Subcutaneous Q8 Weeks Kennith Gain, MD   30 mg at 08/30/20 1129    Physical Findings: The patient is in no acute distress. Patient is alert and oriented.  height is 5\' 5"  (1.651 m) and weight is 157 lb 6.4 oz (71.4 kg). Her temperature is 97.9 F (36.6 C). Her blood pressure is 123/77 and her pulse is 84. Her respiration is 18 and oxygen saturation is 100%.  No significant changes. Lungs are clear to auscultation bilaterally. Heart has regular rate and rhythm. No palpable cervical, supraclavicular, or axillary adenopathy. Abdomen soft, non-tender, normal bowel sounds.   Lab Findings: Lab Results  Component Value Date   WBC 4.6 08/29/2020   HGB 10.4 (L) 08/29/2020   HCT 31.8 (L) 08/29/2020   MCV 91.9 08/29/2020   PLT 209 08/29/2020    Radiographic Findings: CT Chest W Contrast  Result Date: 08/29/2020 CLINICAL DATA:  Non-small lung cancer restaging EXAM: CT CHEST WITH CONTRAST TECHNIQUE: Multidetector CT imaging of the chest was performed during intravenous contrast administration. CONTRAST:  83mL OMNIPAQUE IOHEXOL 300 MG/ML  SOLN COMPARISON:  PET-CT, 06/17/2020, CT chest, 05/17/2020  FINDINGS: Cardiovascular: Right chest port catheter. Aortic atherosclerosis. Normal heart size. Three-vessel coronary artery calcifications. No pericardial effusion. Mediastinum/Nodes: No persistently enlarged mediastinal, hilar, or axillary lymph nodes. Heterogeneous, multinodular thyroid. Trachea, and esophagus demonstrate no significant findings. Lungs/Pleura: Interval decrease in size of spiculated mass of the medial left lower lobe measuring 2.1 x 2.1 cm, previously 2.8 x 2.6 cm (series 5, image 103). Stable 3 mm nodule of the lateral segment right middle lobe (series 5, image 82). No pleural effusion or pneumothorax. Upper Abdomen: No acute abnormality. Musculoskeletal: No chest wall mass or suspicious bone lesions identified. IMPRESSION: 1. Interval decrease in size of spiculated mass of the medial left lower lobe. 2. No persistently enlarged mediastinal, hilar, or axillary lymph nodes. 3. Findings are consistent with response of primary lung malignancy and nodal metastatic disease. 4. Stable 3 mm nodule of the lateral segment right middle lobe, almost certainly benign and incidental. Attention on follow-up. 5. Enlarged and heterogeneous thyroid. Recommend thyroid ultrasound if and when clinically appropriate in the setting of  metastatic malignancy (ref: J Am Coll Radiol. 2015 Feb;12(2): 143-50). 6. Coronary artery disease. Aortic Atherosclerosis (ICD10-I70.0). Electronically Signed   By: Eddie Candle M.D.   On: 08/29/2020 21:41    Impression: Stage IIIB (T2a, N3, M0) non-small cell left lower lobe lung cancer, adenocarcinoma  The patient is recovering from the effects of radiation. Recent chest CT scan showed interval decrease in size of the spiculated mass of the medial left lower lobe. There were no persistently enlarged mediastinal, hilar, or axillary lymph nodes. Findings were consistent with response of primary lung malignancy and nodal metastatic disease.  Plan: The patient is scheduled to  follow up with Dr. [name] on 10/05/2020. She will follow up with radiation oncology in as needed basis.   ____________________________________   Blair Promise, PhD, MD  This document serves as a record of services personally performed by Gery Pray, MD. It was created on his behalf by Clerance Lav, a trained medical scribe. The creation of this record is based on the scribe's personal observations and the provider's statements to them. This document has been checked and approved by the attending provider.

## 2020-09-05 ENCOUNTER — Ambulatory Visit
Admission: RE | Admit: 2020-09-05 | Discharge: 2020-09-05 | Disposition: A | Discharge status: Home / Self Care | Payer: Medicare HMO | Source: Ambulatory Visit | Attending: Radiation Oncology | Admitting: Radiation Oncology

## 2020-09-05 ENCOUNTER — Other Ambulatory Visit: Payer: Self-pay

## 2020-09-05 VITALS — BP 123/77 | HR 84 | Temp 97.9°F | Resp 18 | Ht 65.0 in | Wt 157.4 lb

## 2020-09-05 DIAGNOSIS — I700 Atherosclerosis of aorta: Secondary | ICD-10-CM | POA: Diagnosis not present

## 2020-09-05 DIAGNOSIS — Z79899 Other long term (current) drug therapy: Secondary | ICD-10-CM | POA: Diagnosis not present

## 2020-09-05 DIAGNOSIS — E042 Nontoxic multinodular goiter: Secondary | ICD-10-CM | POA: Insufficient documentation

## 2020-09-05 DIAGNOSIS — Z923 Personal history of irradiation: Secondary | ICD-10-CM | POA: Insufficient documentation

## 2020-09-05 DIAGNOSIS — I2510 Atherosclerotic heart disease of native coronary artery without angina pectoris: Secondary | ICD-10-CM | POA: Insufficient documentation

## 2020-09-05 DIAGNOSIS — C3492 Malignant neoplasm of unspecified part of left bronchus or lung: Secondary | ICD-10-CM

## 2020-09-05 DIAGNOSIS — Z7951 Long term (current) use of inhaled steroids: Secondary | ICD-10-CM | POA: Insufficient documentation

## 2020-09-05 DIAGNOSIS — C3432 Malignant neoplasm of lower lobe, left bronchus or lung: Secondary | ICD-10-CM | POA: Insufficient documentation

## 2020-09-05 HISTORY — DX: Personal history of irradiation: Z92.3

## 2020-09-05 NOTE — Progress Notes (Signed)
Angelica [name] is here today for follow up post radiation to the lung.  Lung Side: left side completed radiation on 08/05/20  Does the patient complain of any of the following: . Pain: Denies any pain.  Marland Kitchen Shortness of breath w/wo exertion: No . Cough: no . Hemoptysis: no . Pain with swallowing: no . Swallowing/choking concerns: no . Appetite: Reports having a good appetite.  . Energy Level: reports having a good energy level.  Marland Kitchen Post radiation skin Changes: no skin issues reported    Additional comments if applicable:  Vitals:   89/38/10 0941  BP: 123/77  Pulse: 84  Resp: 18  Temp: 97.9 F (36.6 C)  SpO2: 100%  Weight: 157 lb 6.4 oz (71.4 kg)  Height: 5\' 5"  (1.651 m)

## 2020-09-06 ENCOUNTER — Inpatient Hospital Stay: Payer: Medicare HMO

## 2020-09-06 VITALS — BP 110/58 | HR 80 | Temp 97.9°F | Resp 18 | Wt 158.5 lb

## 2020-09-06 DIAGNOSIS — C778 Secondary and unspecified malignant neoplasm of lymph nodes of multiple regions: Secondary | ICD-10-CM | POA: Diagnosis not present

## 2020-09-06 DIAGNOSIS — J45909 Unspecified asthma, uncomplicated: Secondary | ICD-10-CM | POA: Diagnosis not present

## 2020-09-06 DIAGNOSIS — C3492 Malignant neoplasm of unspecified part of left bronchus or lung: Secondary | ICD-10-CM

## 2020-09-06 DIAGNOSIS — R5383 Other fatigue: Secondary | ICD-10-CM | POA: Diagnosis not present

## 2020-09-06 DIAGNOSIS — E785 Hyperlipidemia, unspecified: Secondary | ICD-10-CM | POA: Diagnosis not present

## 2020-09-06 DIAGNOSIS — Z5112 Encounter for antineoplastic immunotherapy: Secondary | ICD-10-CM | POA: Diagnosis not present

## 2020-09-06 DIAGNOSIS — K219 Gastro-esophageal reflux disease without esophagitis: Secondary | ICD-10-CM | POA: Diagnosis not present

## 2020-09-06 DIAGNOSIS — I10 Essential (primary) hypertension: Secondary | ICD-10-CM | POA: Diagnosis not present

## 2020-09-06 DIAGNOSIS — C3432 Malignant neoplasm of lower lobe, left bronchus or lung: Secondary | ICD-10-CM | POA: Diagnosis not present

## 2020-09-06 DIAGNOSIS — Z95828 Presence of other vascular implants and grafts: Secondary | ICD-10-CM

## 2020-09-06 DIAGNOSIS — R1013 Epigastric pain: Secondary | ICD-10-CM | POA: Diagnosis not present

## 2020-09-06 LAB — CBC WITH DIFFERENTIAL (CANCER CENTER ONLY)
Abs Immature Granulocytes: 0.03 10*3/uL (ref 0.00–0.07)
Basophils Absolute: 0 10*3/uL (ref 0.0–0.1)
Basophils Relative: 1 %
Eosinophils Absolute: 0 10*3/uL (ref 0.0–0.5)
Eosinophils Relative: 0 %
HCT: 32.3 % — ABNORMAL LOW (ref 36.0–46.0)
Hemoglobin: 10.8 g/dL — ABNORMAL LOW (ref 12.0–15.0)
Immature Granulocytes: 1 %
Lymphocytes Relative: 26 %
Lymphs Abs: 1.5 10*3/uL (ref 0.7–4.0)
MCH: 30.5 pg (ref 26.0–34.0)
MCHC: 33.4 g/dL (ref 30.0–36.0)
MCV: 91.2 fL (ref 80.0–100.0)
Monocytes Absolute: 0.8 10*3/uL (ref 0.1–1.0)
Monocytes Relative: 14 %
Neutro Abs: 3.4 10*3/uL (ref 1.7–7.7)
Neutrophils Relative %: 58 %
Platelet Count: 217 10*3/uL (ref 150–400)
RBC: 3.54 MIL/uL — ABNORMAL LOW (ref 3.87–5.11)
RDW: 16.9 % — ABNORMAL HIGH (ref 11.5–15.5)
WBC Count: 5.7 10*3/uL (ref 4.0–10.5)
nRBC: 0 % (ref 0.0–0.2)

## 2020-09-06 LAB — CMP (CANCER CENTER ONLY)
ALT: 10 U/L (ref 0–44)
AST: 21 U/L (ref 15–41)
Albumin: 3.4 g/dL — ABNORMAL LOW (ref 3.5–5.0)
Alkaline Phosphatase: 64 U/L (ref 38–126)
Anion gap: 9 (ref 5–15)
BUN: 9 mg/dL (ref 8–23)
CO2: 24 mmol/L (ref 22–32)
Calcium: 9.2 mg/dL (ref 8.9–10.3)
Chloride: 106 mmol/L (ref 98–111)
Creatinine: 0.86 mg/dL (ref 0.44–1.00)
GFR, Estimated: >60 mL/min (ref >60)
Glucose, Bld: 99 mg/dL (ref 70–99)
Potassium: 4.4 mmol/L (ref 3.5–5.1)
Sodium: 139 mmol/L (ref 135–145)
Total Bilirubin: 0.5 mg/dL (ref 0.3–1.2)
Total Protein: 6.7 g/dL (ref 6.5–8.1)

## 2020-09-06 LAB — TSH: TSH: 3.206 u[IU]/mL (ref 0.308–3.960)

## 2020-09-06 MED ORDER — SODIUM CHLORIDE 0.9% FLUSH
10.0000 mL | INTRAVENOUS | Status: DC | PRN
  Administered 2020-09-06: 10 mL via INTRAVENOUS
  Filled 2020-09-06: qty 10

## 2020-09-06 MED ORDER — SODIUM CHLORIDE 0.9 % IV SOLN
1500.0000 mg | Freq: Once | INTRAVENOUS | Status: AC
  Administered 2020-09-06: 1500 mg via INTRAVENOUS
  Filled 2020-09-06: qty 30

## 2020-09-06 MED ORDER — HEPARIN SOD (PORK) LOCK FLUSH 100 UNIT/ML IV SOLN
500.0000 [IU] | Freq: Once | INTRAVENOUS | Status: AC | PRN
  Administered 2020-09-06: 500 [IU]
  Filled 2020-09-06: qty 5

## 2020-09-06 MED ORDER — SODIUM CHLORIDE 0.9 % IV SOLN
Freq: Once | INTRAVENOUS | Status: AC
  Filled 2020-09-06: qty 250

## 2020-09-06 MED ORDER — SODIUM CHLORIDE 0.9% FLUSH
10.0000 mL | INTRAVENOUS | Status: DC | PRN
  Administered 2020-09-06: 10 mL
  Filled 2020-09-06: qty 10

## 2020-09-06 NOTE — Patient Instructions (Addendum)
[facility] Discharge Instructions for Patients Receiving Immunotherapy  Today you received the following immunotherapy agents: durvalumab (Imfinzi)  To help prevent nausea and vomiting after your treatment, we encourage you to take your nausea medication as prescribed by your physician.    If you develop nausea and vomiting that is not controlled by your nausea medication, call the clinic.   BELOW ARE SYMPTOMS THAT SHOULD BE REPORTED IMMEDIATELY:  *FEVER GREATER THAN 100.5 F  *CHILLS WITH OR WITHOUT FEVER  NAUSEA AND VOMITING THAT IS NOT CONTROLLED WITH YOUR NAUSEA MEDICATION  *UNUSUAL SHORTNESS OF BREATH  *UNUSUAL BRUISING OR BLEEDING  TENDERNESS IN MOUTH AND THROAT WITH OR WITHOUT PRESENCE OF ULCERS  *URINARY PROBLEMS  *BOWEL PROBLEMS  UNUSUAL RASH Items with * indicate a potential emergency and should be followed up as soon as possible.  Feel free to call the clinic should you have any questions or concerns. The clinic phone number is [phone].  Please show the [facility] at check-in to the Emergency Department and triage nurse.  Durvalumab injection What is this medicine? DURVALUMAB (dur VAL ue mab) is a monoclonal antibody. It is used to treat lung cancer. This medicine may be used for other purposes; ask your health care provider or pharmacist if you have questions. COMMON BRAND NAME(S): IMFINZI What should I tell my health care provider before I take this medicine? They need to know if you have any of these conditions:  autoimmune diseases like Crohn's disease, ulcerative colitis, or lupus  have had or planning to have an allogeneic stem cell transplant (uses someone else's stem cells)  history of organ transplant  history of radiation to the chest  nervous system problems like myasthenia gravis or Guillain-Barre syndrome  an unusual or allergic reaction to durvalumab, other medicines, foods, dyes, or preservatives  pregnant or  trying to get pregnant  breast-feeding How should I use this medicine? This medicine is for infusion into a vein. It is given by a health care professional in a hospital or clinic setting. A special MedGuide will be given to you before each treatment. Be sure to read this information carefully each time. Talk to your pediatrician regarding the use of this medicine in children. Special care may be needed. Overdosage: If you think you have taken too much of this medicine contact a poison control center or emergency room at once. NOTE: This medicine is only for you. Do not share this medicine with others. What if I miss a dose? It is important not to miss your dose. Call your doctor or health care professional if you are unable to keep an appointment. What may interact with this medicine? Interactions have not been studied. This list may not describe all possible interactions. Give your health care provider a list of all the medicines, herbs, non-prescription drugs, or dietary supplements you use. Also tell them if you smoke, drink alcohol, or use illegal drugs. Some items may interact with your medicine. What should I watch for while using this medicine? This drug may make you feel generally unwell. Continue your course of treatment even though you feel ill unless your doctor tells you to stop. You may need blood work done while you are taking this medicine. Do not become pregnant while taking this medicine or for 3 months after stopping it. Women should inform their doctor if they wish to become pregnant or think they might be pregnant. There is a potential for serious side effects to an unborn  child. Talk to your health care professional or pharmacist for more information. Do not breast-feed an infant while taking this medicine or for 3 months after stopping it. What side effects may I notice from receiving this medicine? Side effects that you should report to your doctor or health care  professional as soon as possible:  allergic reactions like skin rash, itching or hives, swelling of the face, lips, or tongue  black, tarry stools  bloody or watery diarrhea  breathing problems  change in emotions or moods  change in sex drive  changes in vision  chest pain or chest tightness  chills  confusion  cough  facial flushing  fever  headache  signs and symptoms of high blood sugar such as dizziness; dry mouth; dry skin; fruity breath; nausea; stomach pain; increased hunger or thirst; increased urination  signs and symptoms of liver injury like dark yellow or brown urine; general ill feeling or flu-like symptoms; light-colored stools; loss of appetite; nausea; right upper belly pain; unusually weak or tired; yellowing of the eyes or skin  stomach pain  trouble passing urine or change in the amount of urine  weight gain or weight loss Side effects that usually do not require medical attention (report these to your doctor or health care professional if they continue or are bothersome):  bone pain  constipation  loss of appetite  muscle pain  nausea  swelling of the ankles, feet, hands  tiredness This list may not describe all possible side effects. Call your doctor for medical advice about side effects. You may report side effects to FDA at 1-800-FDA-1088. Where should I keep my medicine? This drug is given in a hospital or clinic and will not be stored at home. NOTE: This sheet is a summary. It may not cover all possible information. If you have questions about this medicine, talk to your doctor, pharmacist, or health care provider.  2021 Elsevier/Gold Standard (2019-07-16 13:01:29)

## 2020-09-06 NOTE — Patient Instructions (Signed)
Implanted Port Insertion, Care After This sheet gives you information about how to care for yourself after your procedure. Your health care provider may also give you more specific instructions. If you have problems or questions, contact your health care provider. What can I expect after the procedure? After the procedure, it is common to have:  Discomfort at the port insertion site.  Bruising on the skin over the port. This should improve over 3-4 days. Follow these instructions at home: Port care  After your port is placed, you will get a manufacturer's information card. The card has information about your port. Keep this card with you at all times.  Take care of the port as told by your health care provider. Ask your health care provider if you or a family member can get training for taking care of the port at home. A home health care nurse may also take care of the port.  Make sure to remember what type of port you have. Incision care  Follow instructions from your health care provider about how to take care of your port insertion site. Make sure you: ? Wash your hands with soap and water before and after you change your bandage (dressing). If soap and water are not available, use hand sanitizer. ? Change your dressing as told by your health care provider. ? Leave stitches (sutures), skin glue, or adhesive strips in place. These skin closures may need to stay in place for 2 weeks or longer. If adhesive strip edges start to loosen and curl up, you may trim the loose edges. Do not remove adhesive strips completely unless your health care provider tells you to do that.  Check your port insertion site every day for signs of infection. Check for: ? Redness, swelling, or pain. ? Fluid or blood. ? Warmth. ? Pus or a bad smell.      Activity  Return to your normal activities as told by your health care provider. Ask your health care provider what activities are safe for you.  Do not  lift anything that is heavier than 10 lb (4.5 kg), or the limit that you are told, until your health care provider says that it is safe. General instructions  Take over-the-counter and prescription medicines only as told by your health care provider.  Do not take baths, swim, or use a hot tub until your health care provider approves. Ask your health care provider if you may take showers. You may only be allowed to take sponge baths.  Do not drive for 24 hours if you were given a sedative during your procedure.  Wear a medical alert bracelet in case of an emergency. This will tell any health care providers that you have a port.  Keep all follow-up visits as told by your health care provider. This is important. Contact a health care provider if:  You cannot flush your port with saline as directed, or you cannot draw blood from the port.  You have a fever or chills.  You have redness, swelling, or pain around your port insertion site.  You have fluid or blood coming from your port insertion site.  Your port insertion site feels warm to the touch.  You have pus or a bad smell coming from the port insertion site. Get help right away if:  You have chest pain or shortness of breath.  You have bleeding from your port that you cannot control. Summary  Take care of the port as told by your   health care provider. Keep the manufacturer's information card with you at all times.  Change your dressing as told by your health care provider.  Contact a health care provider if you have a fever or chills or if you have redness, swelling, or pain around your port insertion site.  Keep all follow-up visits as told by your health care provider. This information is not intended to replace advice given to you by your health care provider. Make sure you discuss any questions you have with your health care provider. Document Revised: 12/03/2017 Document Reviewed: 12/03/2017 Elsevier Patient Education   2021 Elsevier Inc.  

## 2020-09-07 ENCOUNTER — Telehealth: Payer: Self-pay | Admitting: *Deleted

## 2020-09-07 NOTE — Telephone Encounter (Signed)
Called & spoke to daughter to see how pt did with her treatment.  Daughter reports that pt did well & no c/o's.  She reports knowing how to reach [place] if needed.

## 2020-09-07 NOTE — Telephone Encounter (Signed)
-----   Message from Teodoro Spray, RN sent at 09/06/2020 10:23 AM EDT ----- Regarding: Angelica [name] first time chemo follow-up Dr. Julien [name] patient first time imfinzi on 09/06/20. Patient tolerated treatment well. Thank you.

## 2020-09-19 ENCOUNTER — Encounter: Payer: Self-pay | Admitting: Internal Medicine

## 2020-09-26 DIAGNOSIS — H04123 Dry eye syndrome of bilateral lacrimal glands: Secondary | ICD-10-CM | POA: Diagnosis not present

## 2020-09-26 DIAGNOSIS — Z961 Presence of intraocular lens: Secondary | ICD-10-CM | POA: Diagnosis not present

## 2020-09-26 DIAGNOSIS — H26493 Other secondary cataract, bilateral: Secondary | ICD-10-CM | POA: Diagnosis not present

## 2020-09-26 DIAGNOSIS — H40023 Open angle with borderline findings, high risk, bilateral: Secondary | ICD-10-CM | POA: Diagnosis not present

## 2020-09-26 LAB — HM DIABETES EYE EXAM

## 2020-09-28 ENCOUNTER — Ambulatory Visit: Payer: Medicare HMO | Admitting: Family Medicine

## 2020-09-28 ENCOUNTER — Other Ambulatory Visit: Payer: Self-pay

## 2020-09-28 ENCOUNTER — Ambulatory Visit (INDEPENDENT_AMBULATORY_CARE_PROVIDER_SITE_OTHER): Payer: Medicare HMO

## 2020-09-28 ENCOUNTER — Ambulatory Visit: Payer: Self-pay

## 2020-09-28 ENCOUNTER — Encounter: Payer: Self-pay | Admitting: Family Medicine

## 2020-09-28 VITALS — BP 130/70 | HR 99 | Ht 65.0 in | Wt 155.0 lb

## 2020-09-28 DIAGNOSIS — M25512 Pain in left shoulder: Secondary | ICD-10-CM | POA: Diagnosis not present

## 2020-09-28 DIAGNOSIS — M19012 Primary osteoarthritis, left shoulder: Secondary | ICD-10-CM | POA: Diagnosis not present

## 2020-09-28 IMAGING — DX DG SHOULDER 2+V*L*
3 series · 3 of 3 positions shown · non-contrast
Comparison: [DATE].

CLINICAL DATA: Left anterior shoulder pain for 2 weeks. Patient had
a fall a while back, shoulder complaints or more recent.

EXAM:
LEFT SHOULDER - 2+ VIEW

[shoulder ap (1 of 2)]
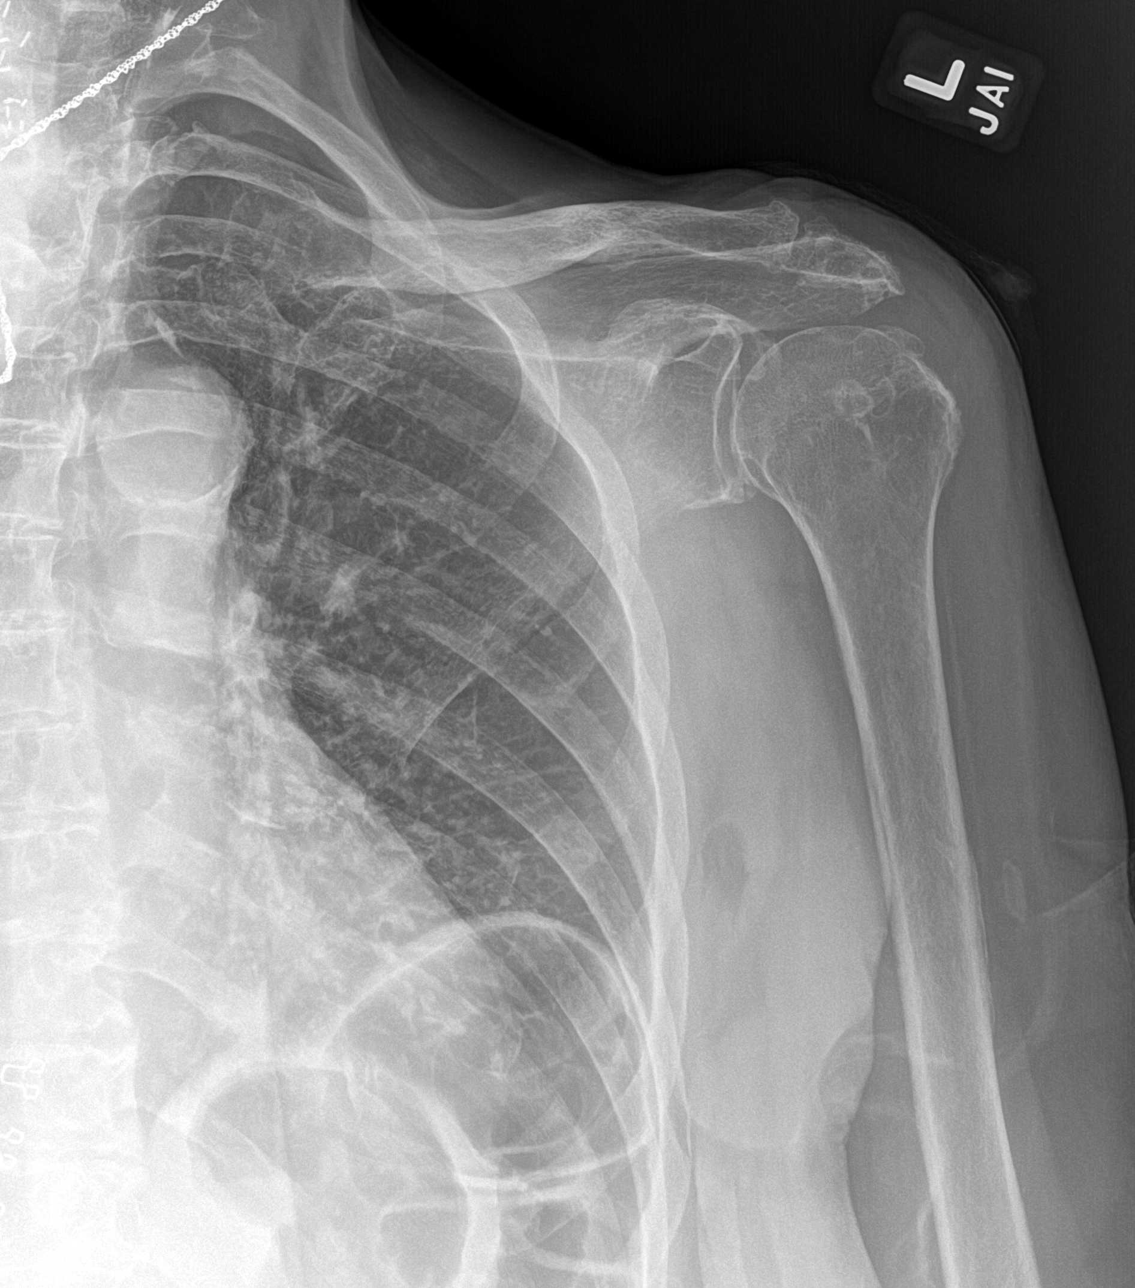

[shoulder ap (2 of 2)]
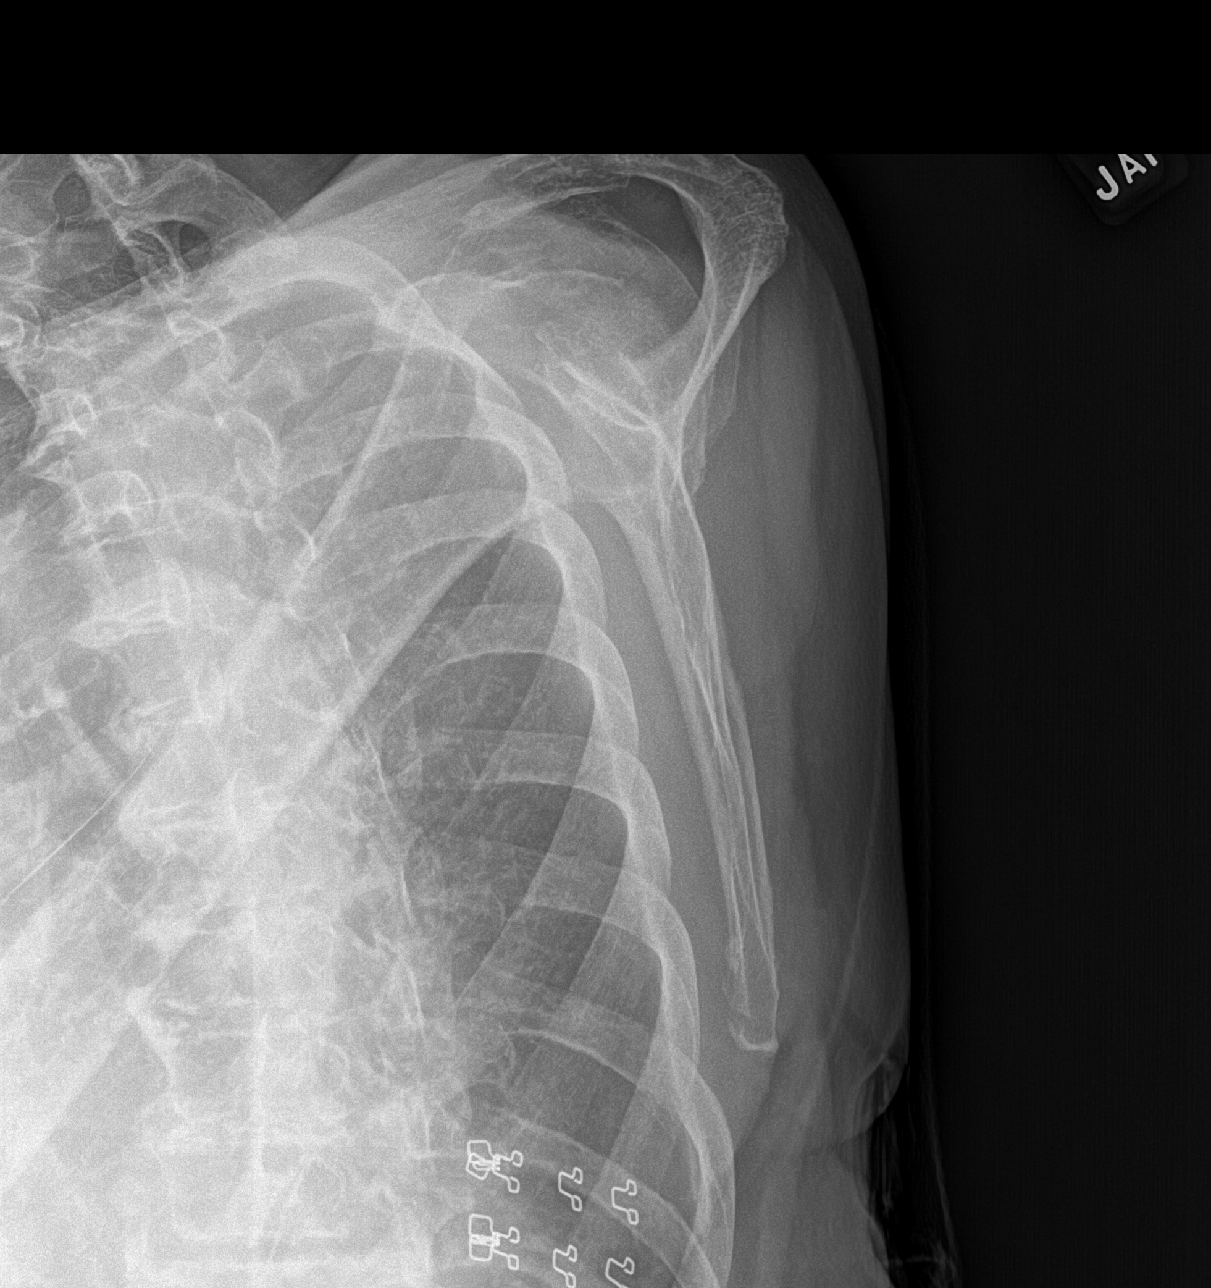

[shoulder axial]
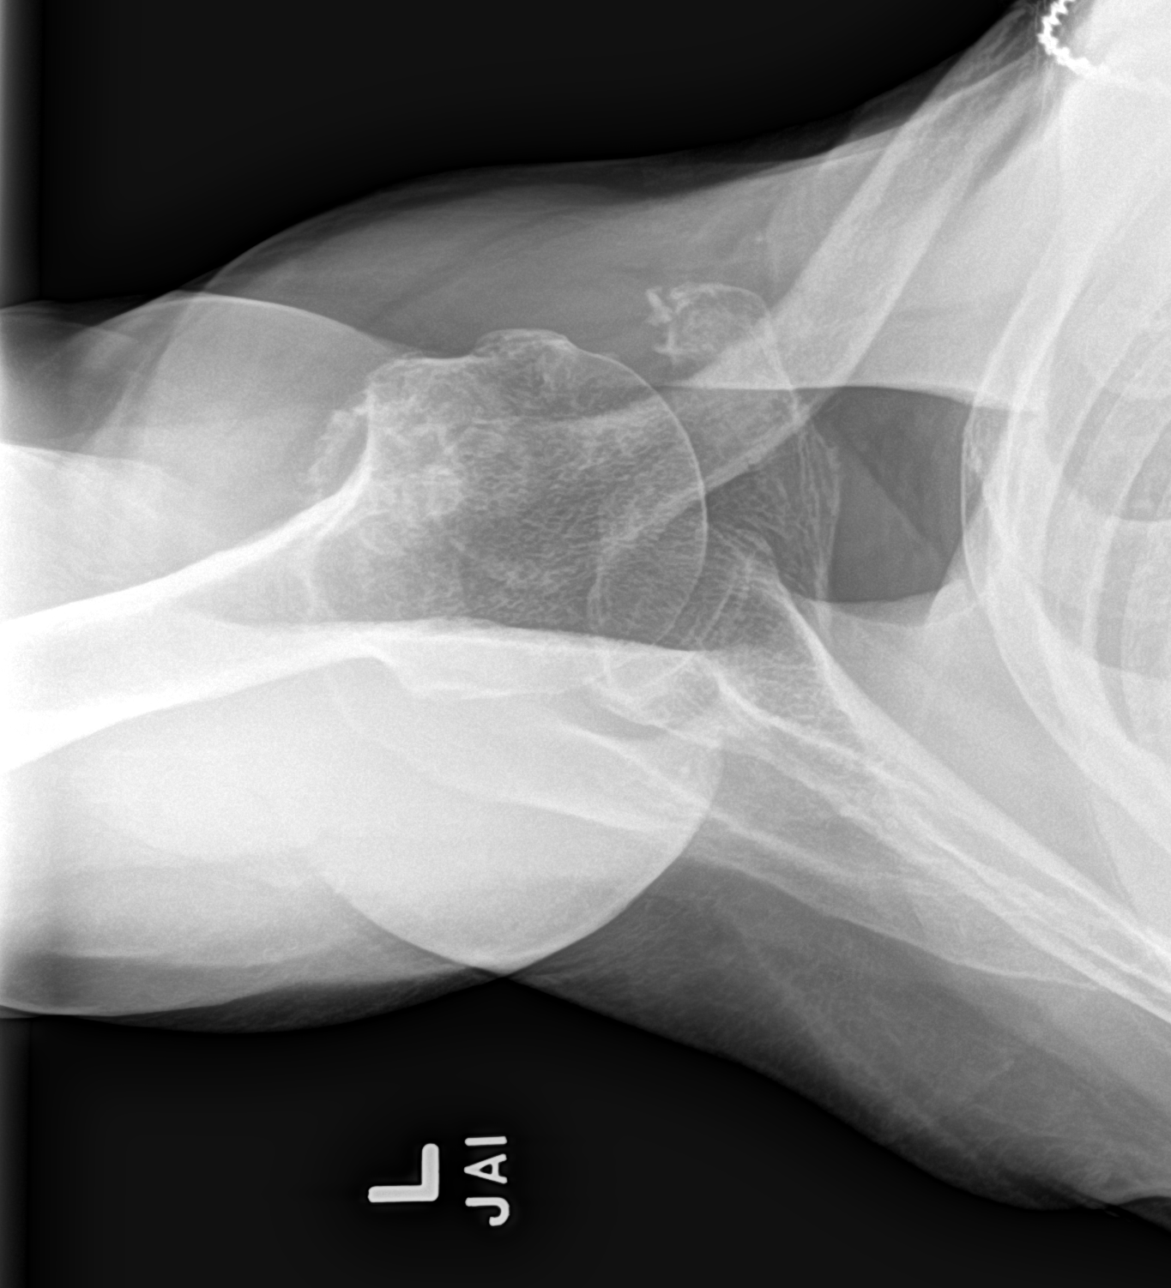

[3 of 3 positions shown; findings below may reference images not displayed]

FINDINGS: No fracture.  No bone lesion.

Glenohumeral joint normally spaced and aligned. Mild narrowing of
the AC joint with small marginal osteophytes.

Skeletal structures are demineralized.

Surrounding soft tissues are unremarkable.
IMPRESSION: 1. No fracture or acute finding.
2. Mild AC joint osteoarthritis. Stable appearance from the prior
radiographs.

## 2020-09-28 NOTE — Patient Instructions (Addendum)
Thank you for coming in today. Call or go to the ER if you develop a large red swollen joint with extreme pain or oozing puss.   I've referred you to Physical Therapy.  Let us know if you don't hear from them in one week.  Please get an Xray today before you leave  Recheck with me in about 6 weeks.   Let me know sooner if you have a problem.

## 2020-09-28 NOTE — Progress Notes (Signed)
Rito Ehrlich, am serving as a Education administrator for Dr. Lynne Leader.  [name] is a 80 y.o. female who presents to [facility] today for L shoulder pain x2 week . Of note, pt has a hx of stage 3 lung cancer diagnosed in Jan 2022 and is currently receiving chemotherapy. Pt was previously seen by Dr. Tamala Julian on 11/12/19 and was given a L [place] steroid injection on 09/29/19. Today, pt locates pain to front of shoulder and in armpit area. Patietns daughter stated that she had a fall on the left shoulder a while ago, but patient has just recently started complaining about the pain. Patient states that she will avoid sleeping on L side as that causes the worst pain. Holding arm up at a 90 degree angle also causes pain. Patient will take tylenol if the pain is persistent, but can usually subside the pian with rest or avoid laying on that side.    Radiates: no Mechanical symptoms: no  UE Numbness/tingling:no  UE Weakness:no  Aggravates: laying on left side, holding arm at a 90 degree angle Treatments tried:tylenol, tramadol  Dx imaging: 09/29/19 L shoulder XR  Pertinent review of systems: No fevers or chills  Relevant historical information: Stage III lung cancer   Exam:  BP 130/70 (BP Location: Left Arm, Patient Position: Sitting, Cuff Size: Normal)   Pulse 99   Ht 5\' 5"  (1.651 m)   Wt 155 lb (70.3 kg)   SpO2 97%   BMI 25.79 kg/m  General: Well Developed, well nourished, and in no acute distress.   MSK: Left shoulder normal-appearing Nontender. Range of motion abduction 140 degrees.  Internal rotation lumbar spine extra rotation full. Strength 4/5 abduction 5/5 external and internal rotation. Positive Hawkins and Neer's test. Negative Yergason's and speeds test. Pulses cap refill and sensation are intact distally.    Lab and Radiology Results  X-ray images left shoulder obtained today personally and independently interpreted No acute fractures.   Decreased bone mineral density.  Mild degenerative changes. Await formal radiology review  Procedure: Real-time Ultrasound Guided Injection of left shoulder subacromial bursa Device: Philips Affiniti 50G Images permanently stored and available for review in PACS Ultrasound evaluation left shoulder prior to injection reveals moderate subacromial bursitis.  Intact rotator cuff tendons. Verbal informed consent obtained.  Discussed risks and benefits of procedure. Warned about infection bleeding damage to structures skin hypopigmentation and fat atrophy among others. Patient expresses understanding and agreement Time-out conducted.   Noted no overlying erythema, induration, or other signs of local infection.   Skin prepped in a sterile fashion.   Local anesthesia: Topical Ethyl chloride.   With sterile technique and under real time ultrasound guidance:  40 mg of Kenalog and 2 mL of Marcaine injected into subacromial bursa. Fluid seen entering the bursa.   Completed without difficulty   Pain immediately resolved suggesting accurate placement of the medication.   Advised to call if fevers/chills, erythema, induration, drainage, or persistent bleeding.   Images permanently stored and available for review in the ultrasound unit.  Impression: Technically successful ultrasound guided injection.     Assessment and Plan: 80 y.o. female with shoulder pain thought to be due to subacromial bursitis and rotator cuff tendinopathy.  Plan for subacromial injection today.  Will refer back to home health physical therapy.  She recently was seen by home health in the past.  She does not drive and is somewhat homebound so is a good candidate for home  health PT.  Recheck in 6 weeks.  Return sooner if needed.   PDMP not reviewed this encounter. Orders Placed This Encounter  Procedures  . [place] LIMITED JOINT SPACE STRUCTURES UP LEFT(NO LINKED CHARGES)    Standing Status:   Future    Number of Occurrences:   1     Standing Expiration Date:   09/28/2021    Order Specific Question:   Reason for Exam (SYMPTOM  OR DIAGNOSIS REQUIRED)    Answer:   Left shoulder pain    Order Specific Question:   Preferred imaging location?    Answer:   Internal  . DG Shoulder Left    Standing Status:   Future    Number of Occurrences:   1    Standing Expiration Date:   09/28/2021    Order Specific Question:   Reason for Exam (SYMPTOM  OR DIAGNOSIS REQUIRED)    Answer:   eval shoulder pain left    Order Specific Question:   Preferred imaging location?    Answer:   [facility]  . Ambulatory referral to Home Health    Referral Priority:   Routine    Referral Type:   Home Health Care    Referral Reason:   Specialty Services Required    Requested Specialty:   [place]    Number of Visits Requested:   1   No orders of the defined types were placed in this encounter.    Discussed warning signs or symptoms. Please see discharge instructions. Patient expresses understanding.   The above documentation has been reviewed and is accurate and complete Lynne Leader, M.D.

## 2020-09-29 ENCOUNTER — Encounter: Payer: Self-pay | Admitting: Internal Medicine

## 2020-10-03 DIAGNOSIS — M170 Bilateral primary osteoarthritis of knee: Secondary | ICD-10-CM | POA: Diagnosis not present

## 2020-10-03 DIAGNOSIS — M7592 Shoulder lesion, unspecified, left shoulder: Secondary | ICD-10-CM | POA: Diagnosis not present

## 2020-10-03 DIAGNOSIS — M7552 Bursitis of left shoulder: Secondary | ICD-10-CM | POA: Diagnosis not present

## 2020-10-03 DIAGNOSIS — Z79891 Long term (current) use of opiate analgesic: Secondary | ICD-10-CM | POA: Diagnosis not present

## 2020-10-03 DIAGNOSIS — Z9181 History of falling: Secondary | ICD-10-CM | POA: Diagnosis not present

## 2020-10-03 DIAGNOSIS — Z79899 Other long term (current) drug therapy: Secondary | ICD-10-CM | POA: Diagnosis not present

## 2020-10-03 DIAGNOSIS — Z7951 Long term (current) use of inhaled steroids: Secondary | ICD-10-CM | POA: Diagnosis not present

## 2020-10-03 DIAGNOSIS — C3492 Malignant neoplasm of unspecified part of left bronchus or lung: Secondary | ICD-10-CM | POA: Diagnosis not present

## 2020-10-03 DIAGNOSIS — M19012 Primary osteoarthritis, left shoulder: Secondary | ICD-10-CM | POA: Diagnosis not present

## 2020-10-03 NOTE — Progress Notes (Signed)
Left shoulder x-ray shows mild arthritis but does not look much different compared to prior shoulder x-rays.  No fractures are seen.

## 2020-10-05 ENCOUNTER — Inpatient Hospital Stay: Payer: Medicare HMO

## 2020-10-05 ENCOUNTER — Inpatient Hospital Stay: Payer: Medicare HMO | Attending: Internal Medicine | Admitting: Internal Medicine

## 2020-10-05 ENCOUNTER — Encounter: Payer: Self-pay | Admitting: Internal Medicine

## 2020-10-05 ENCOUNTER — Other Ambulatory Visit: Payer: Self-pay

## 2020-10-05 ENCOUNTER — Other Ambulatory Visit: Payer: Medicare HMO

## 2020-10-05 VITALS — BP 131/72 | HR 91 | Temp 97.3°F | Resp 19 | Ht 65.0 in | Wt 149.7 lb

## 2020-10-05 DIAGNOSIS — E785 Hyperlipidemia, unspecified: Secondary | ICD-10-CM | POA: Insufficient documentation

## 2020-10-05 DIAGNOSIS — Z5112 Encounter for antineoplastic immunotherapy: Secondary | ICD-10-CM | POA: Diagnosis not present

## 2020-10-05 DIAGNOSIS — I10 Essential (primary) hypertension: Secondary | ICD-10-CM | POA: Insufficient documentation

## 2020-10-05 DIAGNOSIS — Z923 Personal history of irradiation: Secondary | ICD-10-CM | POA: Diagnosis not present

## 2020-10-05 DIAGNOSIS — C3492 Malignant neoplasm of unspecified part of left bronchus or lung: Secondary | ICD-10-CM

## 2020-10-05 DIAGNOSIS — Z79899 Other long term (current) drug therapy: Secondary | ICD-10-CM | POA: Diagnosis not present

## 2020-10-05 DIAGNOSIS — M8580 Other specified disorders of bone density and structure, unspecified site: Secondary | ICD-10-CM | POA: Insufficient documentation

## 2020-10-05 DIAGNOSIS — Z7951 Long term (current) use of inhaled steroids: Secondary | ICD-10-CM | POA: Diagnosis not present

## 2020-10-05 DIAGNOSIS — R634 Abnormal weight loss: Secondary | ICD-10-CM | POA: Insufficient documentation

## 2020-10-05 DIAGNOSIS — Z9181 History of falling: Secondary | ICD-10-CM | POA: Insufficient documentation

## 2020-10-05 DIAGNOSIS — Z9221 Personal history of antineoplastic chemotherapy: Secondary | ICD-10-CM | POA: Diagnosis not present

## 2020-10-05 DIAGNOSIS — Z95828 Presence of other vascular implants and grafts: Secondary | ICD-10-CM

## 2020-10-05 DIAGNOSIS — C3432 Malignant neoplasm of lower lobe, left bronchus or lung: Secondary | ICD-10-CM | POA: Insufficient documentation

## 2020-10-05 LAB — CMP (CANCER CENTER ONLY)
ALT: 11 U/L (ref 0–44)
AST: 14 U/L — ABNORMAL LOW (ref 15–41)
Albumin: 3.4 g/dL — ABNORMAL LOW (ref 3.5–5.0)
Alkaline Phosphatase: 82 U/L (ref 38–126)
Anion gap: 12 (ref 5–15)
BUN: 14 mg/dL (ref 8–23)
CO2: 23 mmol/L (ref 22–32)
Calcium: 9.2 mg/dL (ref 8.9–10.3)
Chloride: 104 mmol/L (ref 98–111)
Creatinine: 0.78 mg/dL (ref 0.44–1.00)
GFR, Estimated: >60 mL/min (ref >60)
Glucose, Bld: 96 mg/dL (ref 70–99)
Potassium: 4 mmol/L (ref 3.5–5.1)
Sodium: 139 mmol/L (ref 135–145)
Total Bilirubin: 0.6 mg/dL (ref 0.3–1.2)
Total Protein: 7 g/dL (ref 6.5–8.1)

## 2020-10-05 LAB — CBC WITH DIFFERENTIAL (CANCER CENTER ONLY)
Abs Immature Granulocytes: 0.05 10*3/uL (ref 0.00–0.07)
Basophils Absolute: 0 10*3/uL (ref 0.0–0.1)
Basophils Relative: 0 %
Eosinophils Absolute: 0 10*3/uL (ref 0.0–0.5)
Eosinophils Relative: 0 %
HCT: 34.2 % — ABNORMAL LOW (ref 36.0–46.0)
Hemoglobin: 11.5 g/dL — ABNORMAL LOW (ref 12.0–15.0)
Immature Granulocytes: 1 %
Lymphocytes Relative: 14 %
Lymphs Abs: 1.4 10*3/uL (ref 0.7–4.0)
MCH: 30.7 pg (ref 26.0–34.0)
MCHC: 33.6 g/dL (ref 30.0–36.0)
MCV: 91.4 fL (ref 80.0–100.0)
Monocytes Absolute: 1 10*3/uL (ref 0.1–1.0)
Monocytes Relative: 10 %
Neutro Abs: 7.6 10*3/uL (ref 1.7–7.7)
Neutrophils Relative %: 75 %
Platelet Count: 210 10*3/uL (ref 150–400)
RBC: 3.74 MIL/uL — ABNORMAL LOW (ref 3.87–5.11)
RDW: 14.1 % (ref 11.5–15.5)
WBC Count: 10.1 10*3/uL (ref 4.0–10.5)
nRBC: 0 % (ref 0.0–0.2)

## 2020-10-05 LAB — TSH: TSH: <0.08 u[IU]/mL — ABNORMAL LOW (ref 0.308–3.960)

## 2020-10-05 MED ORDER — SODIUM CHLORIDE 0.9% FLUSH
10.0000 mL | INTRAVENOUS | Status: DC | PRN
  Administered 2020-10-05: 10 mL
  Filled 2020-10-05: qty 10

## 2020-10-05 MED ORDER — SODIUM CHLORIDE 0.9 % IV SOLN
Freq: Once | INTRAVENOUS | Status: AC
  Filled 2020-10-05: qty 250

## 2020-10-05 MED ORDER — SODIUM CHLORIDE 0.9 % IV SOLN
1500.0000 mg | Freq: Once | INTRAVENOUS | Status: AC
  Administered 2020-10-05: 1500 mg via INTRAVENOUS
  Filled 2020-10-05: qty 30

## 2020-10-05 MED ORDER — HEPARIN SOD (PORK) LOCK FLUSH 100 UNIT/ML IV SOLN
500.0000 [IU] | Freq: Once | INTRAVENOUS | Status: AC | PRN
  Administered 2020-10-05: 500 [IU]
  Filled 2020-10-05: qty 5

## 2020-10-05 MED ORDER — SODIUM CHLORIDE 0.9% FLUSH
10.0000 mL | INTRAVENOUS | Status: AC | PRN
Start: 2020-10-05 — End: 2020-10-05
  Administered 2020-10-05: 10 mL
  Filled 2020-10-05: qty 10

## 2020-10-05 NOTE — Patient Instructions (Signed)
[name]  Discharge Instructions: Thank you for choosing [name] to provide your oncology and hematology care.   If you have a lab appointment with the [name], please go directly to the [name] and check in at the registration area.   Wear comfortable clothing and clothing appropriate for easy access to any Portacath or PICC line.   We strive to give you quality time with your provider. You may need to reschedule your appointment if you arrive late (15 or more minutes).  Arriving late affects you and other patients whose appointments are after yours.  Also, if you miss three or more appointments without notifying the office, you may be dismissed from the clinic at the provider's discretion.      For prescription refill requests, have your pharmacy contact our office and allow 72 hours for refills to be completed.    Today you received the following chemotherapy and/or immunotherapy agents imfinzi       To help prevent nausea and vomiting after your treatment, we encourage you to take your nausea medication as directed.  BELOW ARE SYMPTOMS THAT SHOULD BE REPORTED IMMEDIATELY: *FEVER GREATER THAN 100.4 F (38 C) OR HIGHER *CHILLS OR SWEATING *NAUSEA AND VOMITING THAT IS NOT CONTROLLED WITH YOUR NAUSEA MEDICATION *UNUSUAL SHORTNESS OF BREATH *UNUSUAL BRUISING OR BLEEDING *URINARY PROBLEMS (pain or burning when urinating, or frequent urination) *BOWEL PROBLEMS (unusual diarrhea, constipation, pain near the anus) TENDERNESS IN MOUTH AND THROAT WITH OR WITHOUT PRESENCE OF ULCERS (sore throat, sores in mouth, or a toothache) UNUSUAL RASH, SWELLING OR PAIN  UNUSUAL VAGINAL DISCHARGE OR ITCHING   Items with * indicate a potential emergency and should be followed up as soon as possible or go to the Emergency Department if any problems should occur.  Please show the CHEMOTHERAPY ALERT CARD or IMMUNOTHERAPY ALERT CARD at check-in to  the Emergency Department and triage nurse.  Should you have questions after your visit or need to cancel or reschedule your appointment, please contact [name]  Dept: [phone]  and follow the prompts.  Office hours are 8:00 a.m. to 4:30 p.m. Monday - Friday. Please note that voicemails left after 4:00 p.m. may not be returned until the following business day.  We are closed weekends and major holidays. You have access to a nurse at all times for urgent questions. Please call the main number to the clinic Dept: [phone] and follow the prompts.   For any non-urgent questions, you may also contact your provider using MyChart. We now offer e-Visits for anyone 18 and older to request care online for non-urgent symptoms. For details visit [email or web address].   Also download the MyChart app! Go to the app store, search "MyChart", open the app, select [facility], and log in with your MyChart username and password.  Due to Covid, a mask is required upon entering the hospital/clinic. If you do not have a mask, one will be given to you upon arrival. For doctor visits, patients may have 1 support person aged 18 or older with them. For treatment visits, patients cannot have anyone with them due to current Covid guidelines and our immunocompromised population.   

## 2020-10-05 NOTE — Progress Notes (Signed)
[facility] [phone]   [phone]  OFFICE PROGRESS NOTE  [name], MD [place] [address]  DIAGNOSIS: Stage IIIB (T2 a, N3, M0) non-small cell lung cancer, adenocarcinoma presented with left lower lobe lung mass in addition to left hilar and mediastinal as well as right and left cervical lymphadenopathy diagnosed in January 2022  PRIOR THERAPY: Concurrent chemoradiation with weekly carboplatin for AUC of 2 and paclitaxel 45 mg/M2.  Status post 5 cycles.  Last dose was given August 01, 2020.  CURRENT THERAPY: Consolidation treatment with immunotherapy with Imfinzi 1500 mg IV every 4 weeks.  First dose September 06, 2020.  Status post 1 cycle.  INTERVAL HISTORY: [name] 80 y.o. female returns to the clinic today for follow-up visit accompanied by her daughter.  The patient is feeling fine today with no concerning complaints but she lost around 5 pounds since the last visit.  She eats good with no lack of appetite.  She denied having any chest pain, shortness of breath, cough or hemoptysis.  She has no nausea, vomiting, diarrhea or constipation.  She denied having any headache or visual changes.  She tolerated the first cycle of her treatment fairly well.  She is here today for evaluation before starting cycle #2.  MEDICAL HISTORY: Past Medical History:  Diagnosis Date  . Arthritis   . Asthma   . Bowel obstruction ([place])   . Environmental allergies   . GERD (gastroesophageal reflux disease)   . Glaucoma 05/09/2017  . H. pylori infection   . History of radiation therapy 07/05/20-08/05/20   Left lung IMRT Dr. Sondra Come   . HLD (hyperlipidemia) 01/16/2019  . Hypertension   . Iron deficiency anemia   . Osteopenia 05/26/2017  . Thyroid disease   . Urticaria 07/25/2018    ALLERGIES:  is allergic to asa buff (mag [buffered aspirin], ensure [nutritional supplements], fish allergy, milk-related compounds, other,  peanut-containing drug products, penicillins, shellfish-derived products, and strawberry extract.  MEDICATIONS:  Current Outpatient Medications  Medication Sig Dispense Refill  . acetaminophen (TYLENOL) 325 MG tablet Take 2 tablets (650 mg total) by mouth every 6 (six) hours as needed for headache. (Patient taking differently: Take 325 mg by mouth every 6 (six) hours as needed for headache or mild pain.)    . amLODipine (NORVASC) 10 MG tablet Take 1 tablet by mouth once daily 90 tablet 0  . Benralizumab (FASENRA PEN) 30 MG/ML SOAJ Inject into the skin every 8 (eight) weeks.    . budesonide-formoterol (SYMBICORT) 80-4.5 MCG/ACT inhaler Inhale 2 puffs into the lungs 2 (two) times daily. Use for asthma flares 2 puffs twice a day for 2 weeks or until cough and wheeze free (Patient taking differently: Inhale 2 puffs into the lungs 2 (two) times daily as needed (for asthma flares, for 2 weeks, or until cough and wheeze-free).) 10.2 g 5  . Cholecalciferol (VITAMIN D3) 50 MCG (2000 UT) TABS Take 2,000 Units by mouth daily.    Marland Kitchen EPINEPHrine (EPIPEN 2-PAK) 0.3 mg/0.3 mL IJ SOAJ injection Use as directed for severe allergic reaction (Patient taking differently: Inject 0.3 mg into the muscle as needed for anaphylaxis.) 2 each 1  . ipratropium (ATROVENT) 0.06 % nasal spray Place 2 sprays into both nostrils 3 (three) times daily. 15 mL 3  . linaclotide (LINZESS) 72 MCG capsule TAKE 1 CAPSULE BY MOUTH ONCE DAILY AS NEEDED (Patient taking differently: Take 72 mcg by mouth daily as  needed (constipation).) 30 capsule 0  . loratadine (CLARITIN) 10 MG tablet Take 10 mg by mouth daily.    Marland Kitchen losartan (COZAAR) 100 MG tablet Take 100 mg by mouth daily.    . Magnesium Hydroxide (PHILLIPS MILK OF MAGNESIA PO) Take 15-30 mLs by mouth daily as needed (for constipation).    . montelukast (SINGULAIR) 10 MG tablet Take 1 tablet (10 mg total) by mouth at bedtime. 30 tablet 5  . potassium chloride SA (KLOR-CON) 20 MEQ tablet Take 1  tablet (20 mEq total) by mouth daily. (Patient not taking: No sig reported) 7 tablet 0  . pravastatin (PRAVACHOL) 80 MG tablet Take 1 tablet (80 mg total) by mouth daily. 90 tablet 3  . prochlorperazine (COMPAZINE) 10 MG tablet Take 1 tablet (10 mg total) by mouth every 6 (six) hours as needed for nausea or vomiting. 30 tablet 0  . sucralfate (CARAFATE) 1 g tablet Take 1 tablet (1 g total) by mouth 4 (four) times daily -  with meals and at bedtime. Crush and dissolve in 10 mL of warm water prior to swallowing 120 tablet 1  . SYMBICORT 80-4.5 MCG/ACT inhaler Inhale 2 puffs into the lungs in the morning and at bedtime. 10.2 g 5  . telmisartan (MICARDIS) 80 MG tablet Take 1 tablet (80 mg total) by mouth daily. 90 tablet 3  . traMADol (ULTRAM) 50 MG tablet Take 1 tablet (50 mg total) by mouth every 6 (six) hours as needed for moderate pain. 30 tablet 2  . vitamin B-12 (CYANOCOBALAMIN) 1000 MCG tablet Take 1,000 mcg by mouth daily.     Current Facility-Administered Medications  Medication Dose Route Frequency Provider Last Rate Last Admin  . Benralizumab SOSY 30 mg  30 mg Subcutaneous Q8 Weeks Kennith Gain, MD   30 mg at 08/30/20 1129    SURGICAL HISTORY:  Past Surgical History:  Procedure Laterality Date  . ABDOMINAL HYSTERECTOMY    . BOWEL RESECTION N/A 05/23/2020   Procedure: SMALL BOWEL REPAIR;  Surgeon: Kinsinger, Arta Bruce, MD;  Location: [facility];  Service: General;  Laterality: N/A;  . BREAST BIOPSY    . BRONCHIAL BIOPSY  06/10/2020   Procedure: BRONCHIAL BIOPSIES;  Surgeon: Garner Nash, DO;  Location: [facility];  Service: Pulmonary;;  . BRONCHIAL BRUSHINGS  06/10/2020   Procedure: BRONCHIAL BRUSHINGS;  Surgeon: Garner Nash, DO;  Location: [facility];  Service: Pulmonary;;  . BRONCHIAL NEEDLE ASPIRATION BIOPSY  06/10/2020   Procedure: BRONCHIAL NEEDLE ASPIRATION BIOPSIES;  Surgeon: Garner Nash, DO;  Location: [facility];  Service: Pulmonary;;  . BRONCHIAL  WASHINGS  06/10/2020   Procedure: BRONCHIAL WASHINGS;  Surgeon: Garner Nash, DO;  Location: [facility];  Service: Pulmonary;;  . COLON SURGERY    . DIAGNOSTIC LARYNGOSCOPY N/A 05/23/2020   Procedure: ATTEMPTED DIAGNOSTIC LAPAROSCOPY WITH ADHESIONS;  Surgeon: Kieth Brightly, Arta Bruce, MD;  Location: [facility];  Service: General;  Laterality: N/A;  . IR IMAGING GUIDED PORT INSERTION  07/15/2020  . KNEE SURGERY    . LAPAROTOMY N/A 04/18/2015   Procedure: Exploratory laparotomy with lysis of adhesions, possible bowel resection;  Surgeon: Ralene Ok, MD;  Location: [facility];  Service: General;  Laterality: N/A;  . LAPAROTOMY N/A 05/23/2020   Procedure: EXPLORATORY LAPAROTOMY;  Surgeon: Kieth Brightly Arta Bruce, MD;  Location: [facility];  Service: General;  Laterality: N/A;  . LYSIS OF ADHESION N/A 05/23/2020   Procedure: LYSIS OF ADHESION;  Surgeon: Kieth Brightly Arta Bruce, MD;  Location: [facility];  Service: General;  Laterality: N/A;  . NASAL SINUS SURGERY    . THYROID SURGERY    . VIDEO BRONCHOSCOPY WITH ENDOBRONCHIAL NAVIGATION Bilateral 06/10/2020   Procedure: VIDEO BRONCHOSCOPY WITH ENDOBRONCHIAL NAVIGATION;  Surgeon: Garner Nash, DO;  Location: [facility];  Service: Pulmonary;  Laterality: Bilateral;  . VIDEO BRONCHOSCOPY WITH ENDOBRONCHIAL ULTRASOUND  06/10/2020   Procedure: VIDEO BRONCHOSCOPY WITH ENDOBRONCHIAL ULTRASOUND;  Surgeon: Garner Nash, DO;  Location: [facility];  Service: Pulmonary;;    REVIEW OF SYSTEMS:  A comprehensive review of systems was negative except for: Constitutional: positive for weight loss   PHYSICAL EXAMINATION: General appearance: alert, cooperative and no distress Head: Normocephalic, without obvious abnormality, atraumatic Neck: no adenopathy, no JVD, supple, symmetrical, trachea midline and thyroid not enlarged, symmetric, no tenderness/mass/nodules Lymph nodes: Cervical, supraclavicular, and axillary nodes normal. Resp: clear to auscultation bilaterally Back:  symmetric, no curvature. ROM normal. No CVA tenderness. Cardio: regular rate and rhythm, S1, S2 normal, no murmur, click, rub or gallop GI: soft, non-tender; bowel sounds normal; no masses,  no organomegaly Extremities: extremities normal, atraumatic, no cyanosis or edema  ECOG PERFORMANCE STATUS: 1 - Symptomatic but completely ambulatory  Blood pressure 131/72, pulse 91, temperature (!) 97.3 F (36.3 C), temperature source Tympanic, resp. rate 19, height 5\' 5"  (1.651 m), weight 149 lb 11.2 oz (67.9 kg), SpO2 100 %.  LABORATORY DATA: Lab Results  Component Value Date   WBC 10.1 10/05/2020   HGB 11.5 (L) 10/05/2020   HCT 34.2 (L) 10/05/2020   MCV 91.4 10/05/2020   PLT 210 10/05/2020      Chemistry      Component Value Date/Time   NA 139 09/06/2020 0752   K 4.4 09/06/2020 0752   CL 106 09/06/2020 0752   CO2 24 09/06/2020 0752   BUN 9 09/06/2020 0752   CREATININE 0.86 09/06/2020 0752      Component Value Date/Time   CALCIUM 9.2 09/06/2020 0752   ALKPHOS 64 09/06/2020 0752   AST 21 09/06/2020 0752   ALT 10 09/06/2020 0752   BILITOT 0.5 09/06/2020 0752       RADIOGRAPHIC STUDIES: DG Shoulder Left  Result Date: 09/30/2020 CLINICAL DATA:  Left anterior shoulder pain for 2 weeks. Patient had a fall a while back, shoulder complaints or more recent. EXAM: LEFT SHOULDER - 2+ VIEW COMPARISON:  09/29/2019. FINDINGS: No fracture.  No bone lesion. Glenohumeral joint normally spaced and aligned. Mild narrowing of the [facility] joint with small marginal osteophytes. Skeletal structures are demineralized. Surrounding soft tissues are unremarkable. IMPRESSION: 1. No fracture or acute finding. 2. Mild AC joint osteoarthritis. Stable appearance from the prior radiographs. Electronically Signed   By: Lajean Manes M.D.   On: 09/30/2020 14:42   [place] LIMITED JOINT SPACE STRUCTURES UP LEFT(NO LINKED CHARGES)  Result Date: 10/04/2020 Procedure: Real-time Ultrasound Guided Injection of left shoulder  subacromial bursa Device: Philips Affiniti 50G Images permanently stored and available for review in PACS Ultrasound evaluation left shoulder prior to injection reveals moderate subacromial bursitis.  Intact rotator cuff tendons. Verbal informed consent obtained. Discussed risks and benefits of procedure. Warned about infection bleeding damage to structures skin hypopigmentation and fat atrophy among others. Patient expresses understanding and agreement Time-out conducted.  Noted no overlying erythema, induration, or other signs of local infection.  Skin prepped in a sterile fashion.  Local anesthesia: Topical Ethyl chloride.  With sterile technique and under real time ultrasound guidance: 40 mg of Kenalog and 2 mL of Marcaine injected into  subacromial bursa. Fluid seen entering the bursa.  Completed without difficulty  Pain immediately resolved suggesting accurate placement of the medication.  Advised to call if fevers/chills, erythema, induration, drainage, or persistent bleeding.  Images permanently stored and available for review in the ultrasound unit. Impression: Technically successful ultrasound guided injection.   ASSESSMENT AND PLAN: This is a very pleasant 80 years old African-American female recently diagnosed with a stage IIb (T2a, N3, M0) non-small cell lung cancer, adenocarcinoma presented with left lower lobe lung mass in addition to left hilar and mediastinal lymphadenopathy as well as right and left cervical lymphadenopathy diagnosed in January 2022. The patient underwent a course of concurrent chemoradiation with weekly carboplatin and paclitaxel status post 5 cycles with partial response.   The patient is currently on consolidation treatment with Imfinzi 1500 Mg IV every 4 weeks status post 1 cycle.  She tolerated the first cycle of her treatment well with no concerning adverse effects. I recommended for the patient to proceed with cycle #2 today as planned. I will see her back  for follow-up visit in 4 weeks for evaluation before starting cycle #3. The patient was advised to call immediately if she has any concerning symptoms in the interval. The patient voices understanding of current disease status and treatment options and is in agreement with the current care plan.  All questions were answered. The patient knows to call the clinic with any problems, questions or concerns. We can certainly see the patient much sooner if necessary.  Disclaimer: This note was dictated with voice recognition software. Similar sounding words can inadvertently be transcribed and may not be corrected upon review.

## 2020-10-06 ENCOUNTER — Telehealth: Payer: Self-pay | Admitting: Internal Medicine

## 2020-10-06 NOTE — Telephone Encounter (Signed)
Scheduled per los. Called and left msg. Mailed printout  °

## 2020-10-07 ENCOUNTER — Ambulatory Visit: Payer: Medicare HMO | Admitting: Family Medicine

## 2020-10-10 DIAGNOSIS — Z79891 Long term (current) use of opiate analgesic: Secondary | ICD-10-CM | POA: Diagnosis not present

## 2020-10-10 DIAGNOSIS — M170 Bilateral primary osteoarthritis of knee: Secondary | ICD-10-CM | POA: Diagnosis not present

## 2020-10-10 DIAGNOSIS — M7592 Shoulder lesion, unspecified, left shoulder: Secondary | ICD-10-CM | POA: Diagnosis not present

## 2020-10-10 DIAGNOSIS — M19012 Primary osteoarthritis, left shoulder: Secondary | ICD-10-CM | POA: Diagnosis not present

## 2020-10-10 DIAGNOSIS — C3492 Malignant neoplasm of unspecified part of left bronchus or lung: Secondary | ICD-10-CM | POA: Diagnosis not present

## 2020-10-10 DIAGNOSIS — Z7951 Long term (current) use of inhaled steroids: Secondary | ICD-10-CM | POA: Diagnosis not present

## 2020-10-10 DIAGNOSIS — Z9181 History of falling: Secondary | ICD-10-CM | POA: Diagnosis not present

## 2020-10-10 DIAGNOSIS — M7552 Bursitis of left shoulder: Secondary | ICD-10-CM | POA: Diagnosis not present

## 2020-10-10 DIAGNOSIS — Z79899 Other long term (current) drug therapy: Secondary | ICD-10-CM | POA: Diagnosis not present

## 2020-10-11 ENCOUNTER — Other Ambulatory Visit: Payer: Self-pay

## 2020-10-11 ENCOUNTER — Emergency Department (HOSPITAL_COMMUNITY)
Admission: EM | Admit: 2020-10-11 | Discharge: 2020-10-11 | Disposition: A | Discharge status: Home / Self Care | Payer: Medicare HMO | Attending: Emergency Medicine | Admitting: Emergency Medicine

## 2020-10-11 ENCOUNTER — Emergency Department (HOSPITAL_COMMUNITY): Payer: Medicare HMO

## 2020-10-11 ENCOUNTER — Encounter (HOSPITAL_COMMUNITY): Payer: Self-pay

## 2020-10-11 DIAGNOSIS — Z87891 Personal history of nicotine dependence: Secondary | ICD-10-CM | POA: Insufficient documentation

## 2020-10-11 DIAGNOSIS — Z91010 Allergy to peanuts: Secondary | ICD-10-CM | POA: Diagnosis not present

## 2020-10-11 DIAGNOSIS — Z79899 Other long term (current) drug therapy: Secondary | ICD-10-CM | POA: Insufficient documentation

## 2020-10-11 DIAGNOSIS — R531 Weakness: Secondary | ICD-10-CM | POA: Diagnosis not present

## 2020-10-11 DIAGNOSIS — Z85118 Personal history of other malignant neoplasm of bronchus and lung: Secondary | ICD-10-CM | POA: Diagnosis not present

## 2020-10-11 DIAGNOSIS — J4550 Severe persistent asthma, uncomplicated: Secondary | ICD-10-CM | POA: Insufficient documentation

## 2020-10-11 DIAGNOSIS — R42 Dizziness and giddiness: Secondary | ICD-10-CM | POA: Diagnosis not present

## 2020-10-11 DIAGNOSIS — Z7952 Long term (current) use of systemic steroids: Secondary | ICD-10-CM | POA: Diagnosis not present

## 2020-10-11 DIAGNOSIS — E860 Dehydration: Secondary | ICD-10-CM | POA: Diagnosis not present

## 2020-10-11 DIAGNOSIS — R21 Rash and other nonspecific skin eruption: Secondary | ICD-10-CM | POA: Diagnosis not present

## 2020-10-11 LAB — COMPREHENSIVE METABOLIC PANEL
ALT: 15 U/L (ref 0–44)
AST: 18 U/L (ref 15–41)
Albumin: 3.7 g/dL (ref 3.5–5.0)
Alkaline Phosphatase: 77 U/L (ref 38–126)
Anion gap: 6 (ref 5–15)
BUN: 13 mg/dL (ref 8–23)
CO2: 25 mmol/L (ref 22–32)
Calcium: 9.4 mg/dL (ref 8.9–10.3)
Chloride: 105 mmol/L (ref 98–111)
Creatinine, Ser: 0.86 mg/dL (ref 0.44–1.00)
GFR, Estimated: >60 mL/min (ref >60)
Glucose, Bld: 108 mg/dL — ABNORMAL HIGH (ref 70–99)
Potassium: 4.1 mmol/L (ref 3.5–5.1)
Sodium: 136 mmol/L (ref 135–145)
Total Bilirubin: 0.5 mg/dL (ref 0.3–1.2)
Total Protein: 7.2 g/dL (ref 6.5–8.1)

## 2020-10-11 LAB — CBC WITH DIFFERENTIAL/PLATELET
Abs Immature Granulocytes: 0.05 10*3/uL (ref 0.00–0.07)
Basophils Absolute: 0 10*3/uL (ref 0.0–0.1)
Basophils Relative: 0 %
Eosinophils Absolute: 0 10*3/uL (ref 0.0–0.5)
Eosinophils Relative: 0 %
HCT: 34.3 % — ABNORMAL LOW (ref 36.0–46.0)
Hemoglobin: 11.6 g/dL — ABNORMAL LOW (ref 12.0–15.0)
Immature Granulocytes: 1 %
Lymphocytes Relative: 18 %
Lymphs Abs: 1.8 10*3/uL (ref 0.7–4.0)
MCH: 31.4 pg (ref 26.0–34.0)
MCHC: 33.8 g/dL (ref 30.0–36.0)
MCV: 92.7 fL (ref 80.0–100.0)
Monocytes Absolute: 0.8 10*3/uL (ref 0.1–1.0)
Monocytes Relative: 8 %
Neutro Abs: 7.5 10*3/uL (ref 1.7–7.7)
Neutrophils Relative %: 73 %
Platelets: 192 10*3/uL (ref 150–400)
RBC: 3.7 MIL/uL — ABNORMAL LOW (ref 3.87–5.11)
RDW: 14.1 % (ref 11.5–15.5)
WBC: 10.1 10*3/uL (ref 4.0–10.5)
nRBC: 0 % (ref 0.0–0.2)

## 2020-10-11 LAB — URINALYSIS, ROUTINE W REFLEX MICROSCOPIC
Bilirubin Urine: NEGATIVE
Glucose, UA: NEGATIVE mg/dL
Hgb urine dipstick: NEGATIVE
Ketones, ur: NEGATIVE mg/dL
Leukocytes,Ua: NEGATIVE
Nitrite: NEGATIVE
Protein, ur: NEGATIVE mg/dL
Specific Gravity, Urine: 1.002 — ABNORMAL LOW (ref 1.005–1.030)
pH: 7 (ref 5.0–8.0)

## 2020-10-11 IMAGING — DX DG CHEST 1V PORT
1 series · 1 of 1 positions shown · non-contrast
Comparison: Chest radiograph dated [DATE] and CT dated
[DATE].

CLINICAL DATA: 79-year-old female with weakness.

EXAM:
PORTABLE CHEST 1 VIEW

[chest ap]
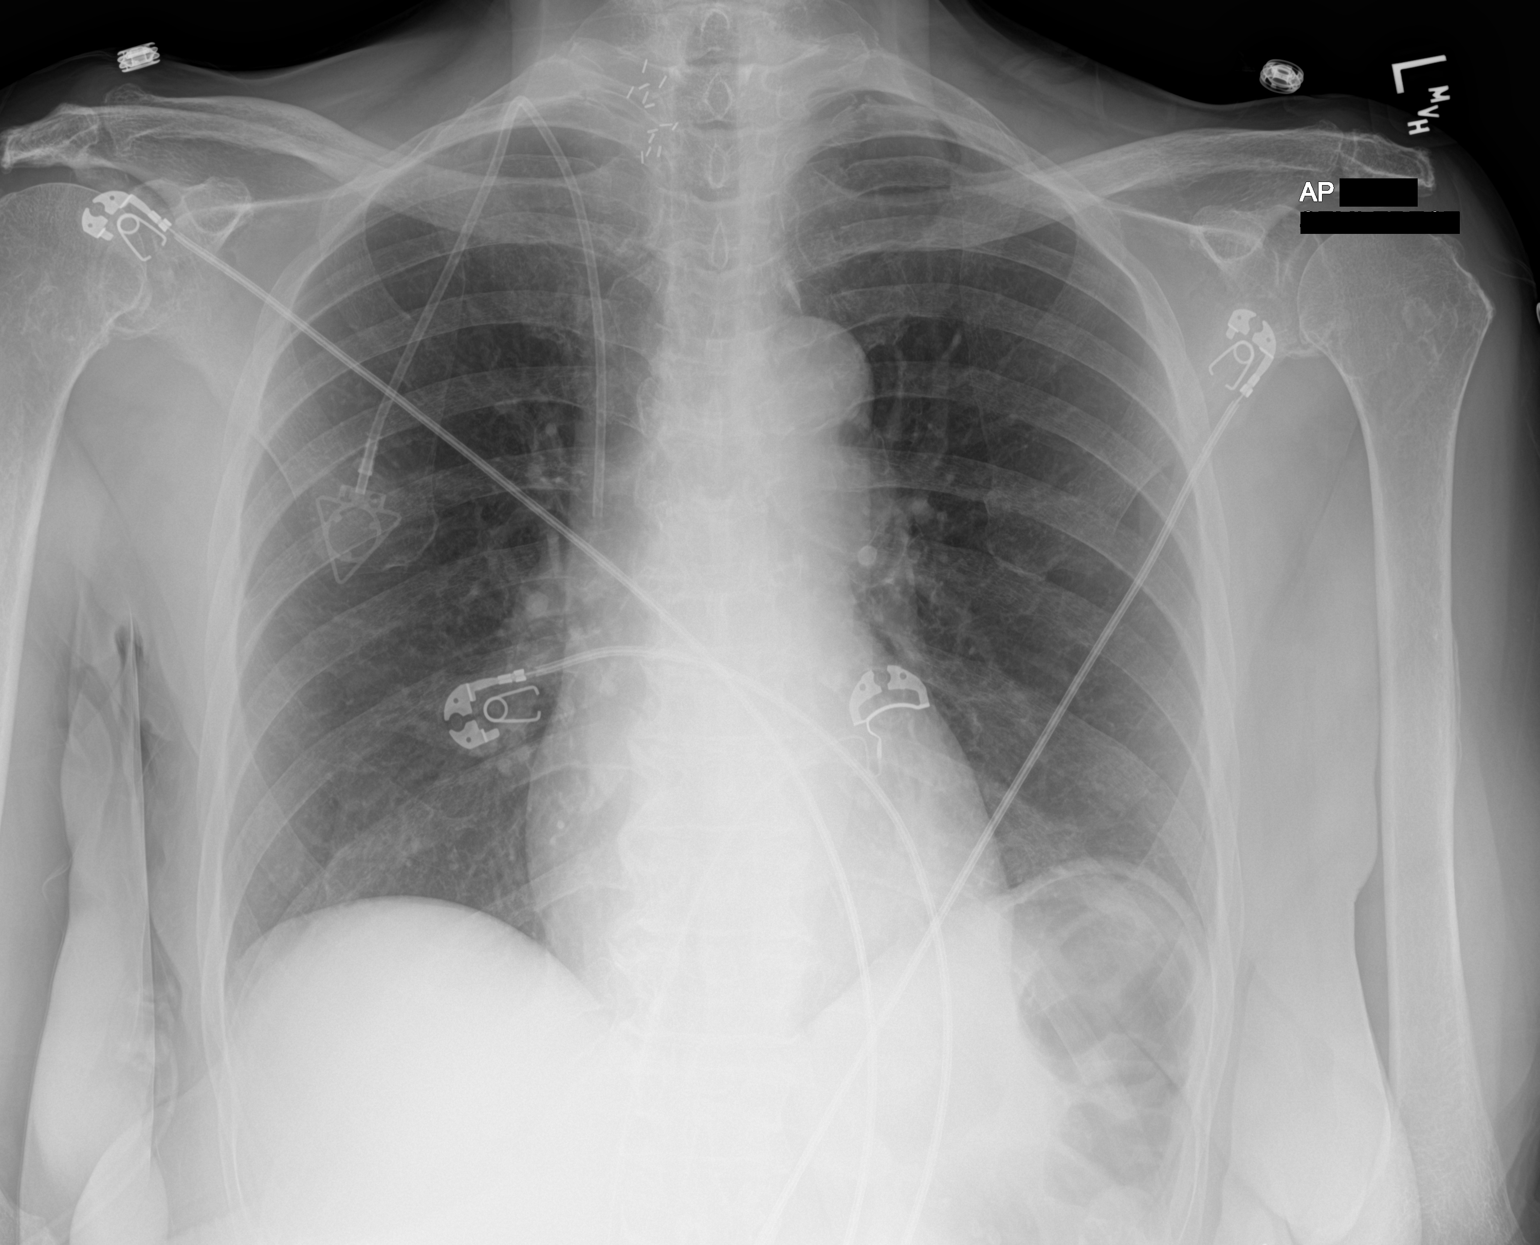

[1 of 1 positions shown; findings below may reference images not displayed]

FINDINGS: Right-sided Port-A-Cath with tip over central SVC. The lungs are
clear. There is no pleural effusion pneumothorax. The cardiac
silhouette is within limits. Atherosclerotic calcification of the
aorta. No acute osseous pathology.
IMPRESSION: No active cardiopulmonary disease.

## 2020-10-11 IMAGING — CT CT HEAD W/O CM
3 series · 15 of 47 positions shown, 18 images · non-contrast
Comparison: None.

CLINICAL DATA: Dizziness, lung cancer

EXAM:
CT HEAD WITHOUT CONTRAST
TECHNIQUE: Contiguous axial images were obtained from the base of the skull
through the vertex without intravenous contrast.

[Series 2: head wo · axial · 0.40mm/px · z∈[-177,-47]mm · 9 of 32 slices shown, 12 images]
[im 3/32  brain]
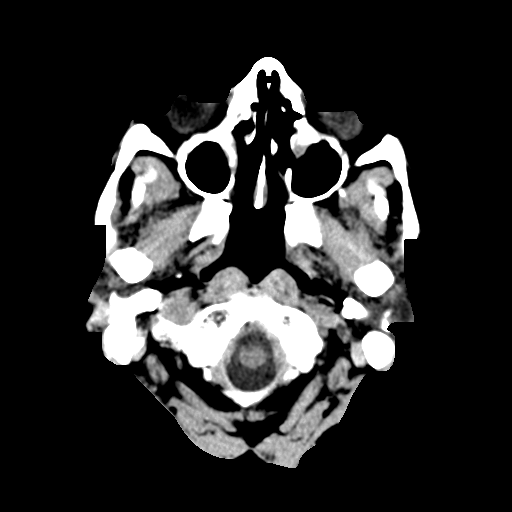
[im 3/32  bone]
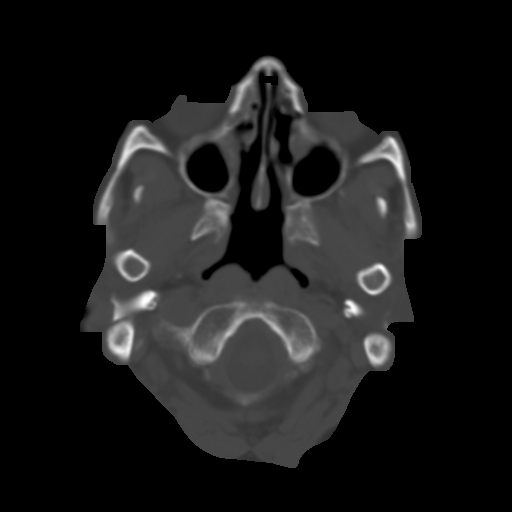
[im 6/32  brain]
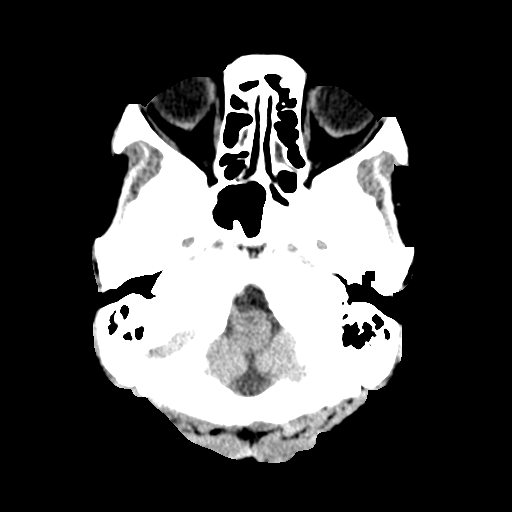
[im 9/32  brain]
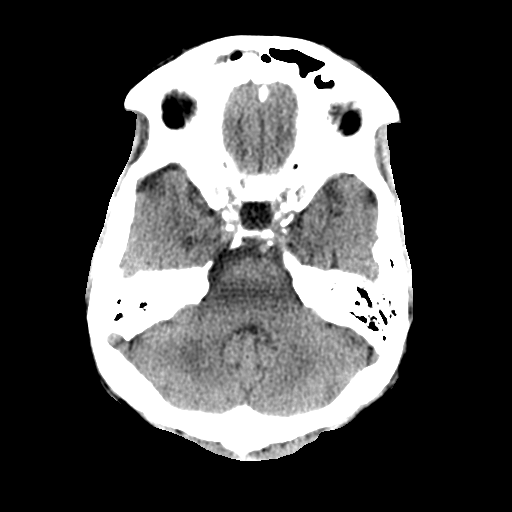
[im 12/32  brain]
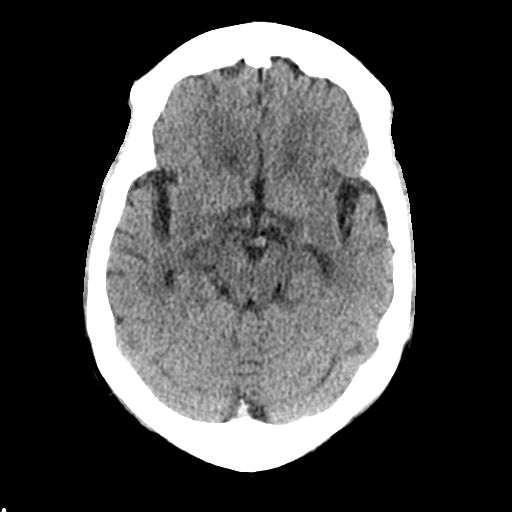
[im 17/32  brain]
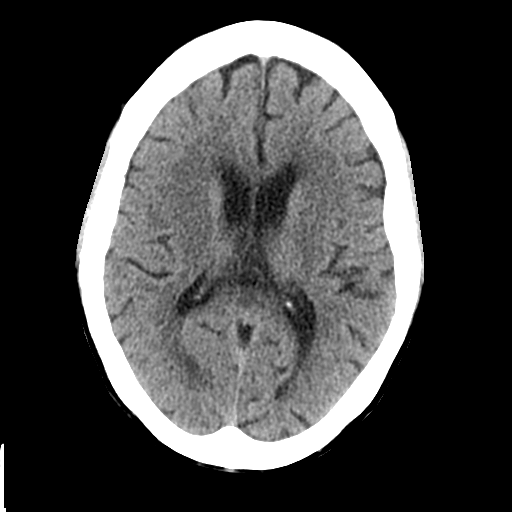
[im 17/32  bone]
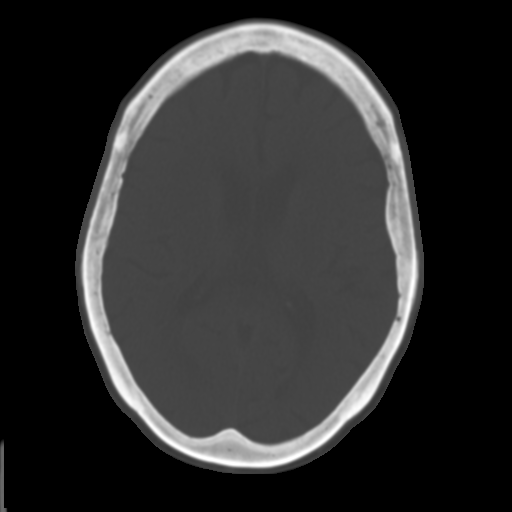
[im 20/32  brain]
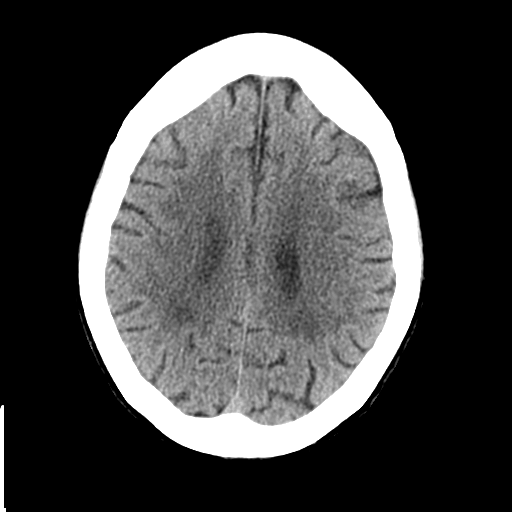
[im 23/32  brain]
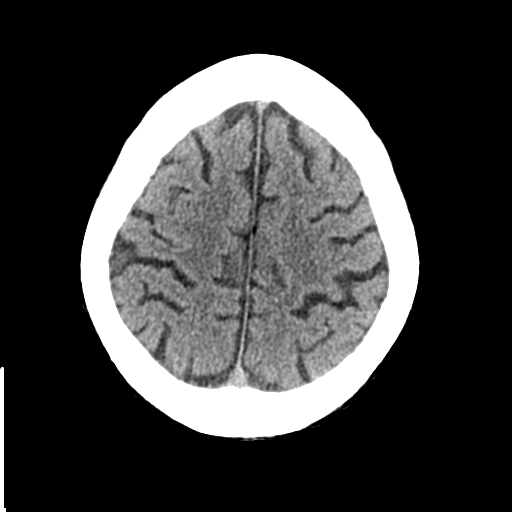
[im 26/32  brain]
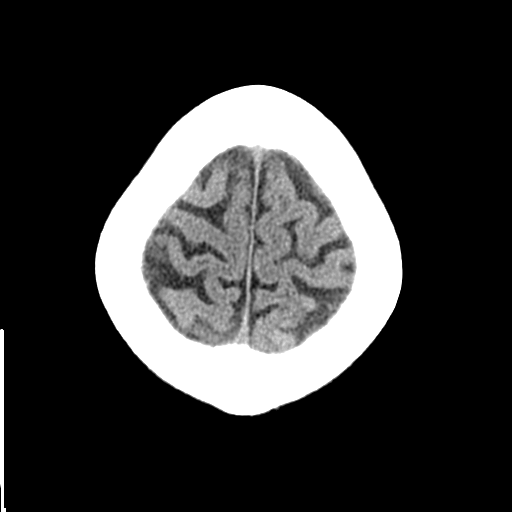
[im 29/32  brain]
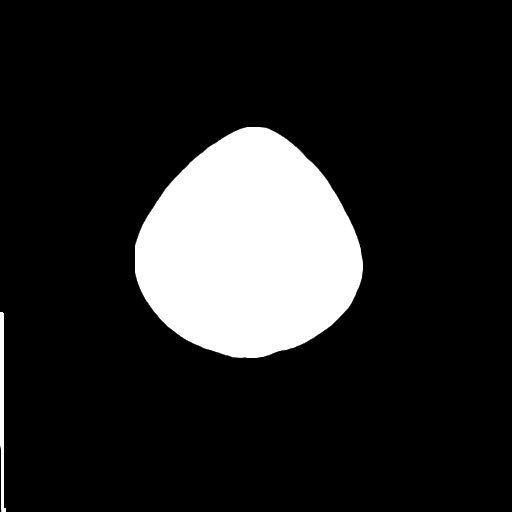
[im 29/32  bone]
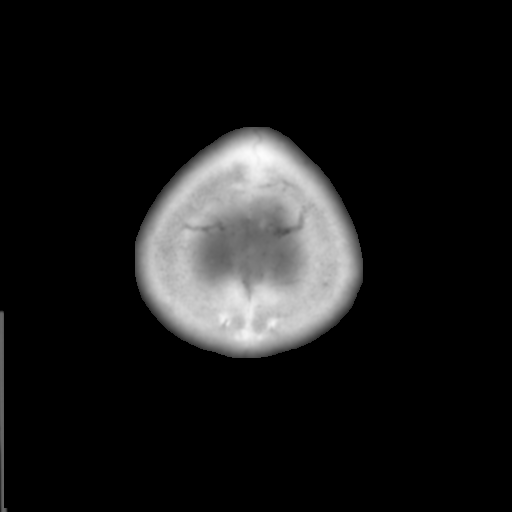

[Series 5: coronal soft tissue · coronal · 0.31mm/px · 3 of 66 slices shown]
[im 22/66  brain]
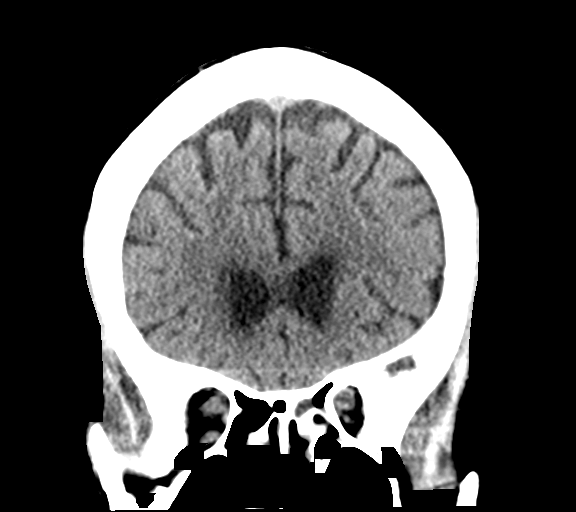
[im 29/66  brain]
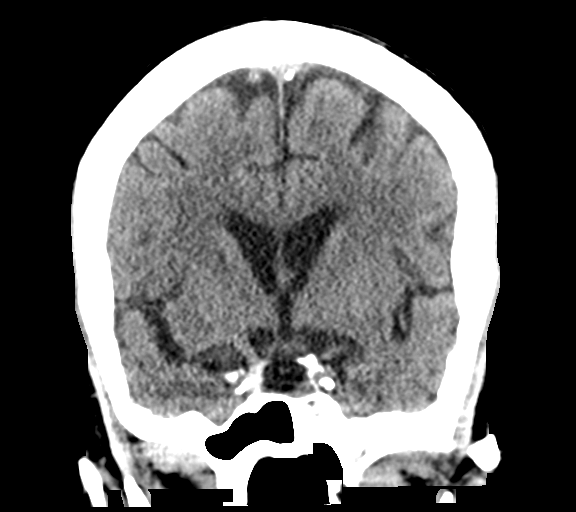
[im 37/66  brain]
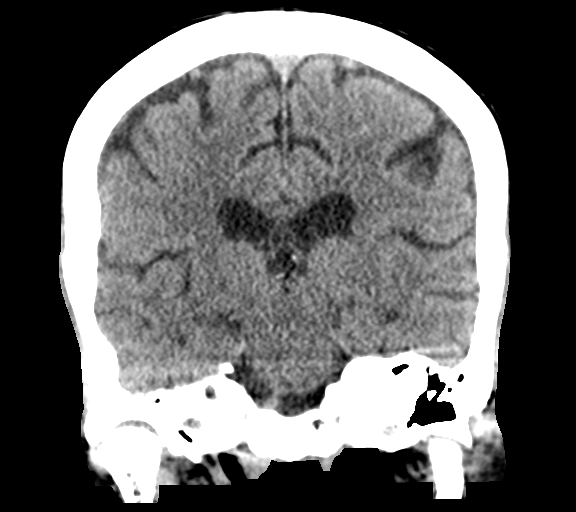

[Series 6: sagittal soft tissue · sagittal · 0.31mm/px · 3 of 60 slices shown]
[im 20/60  brain]
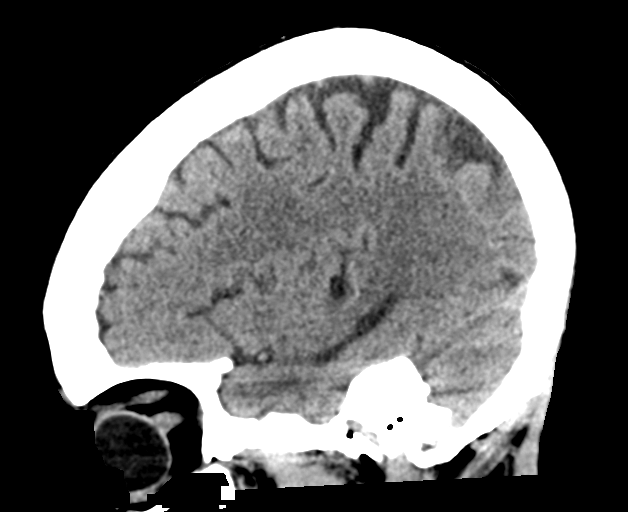
[im 30/60  brain]
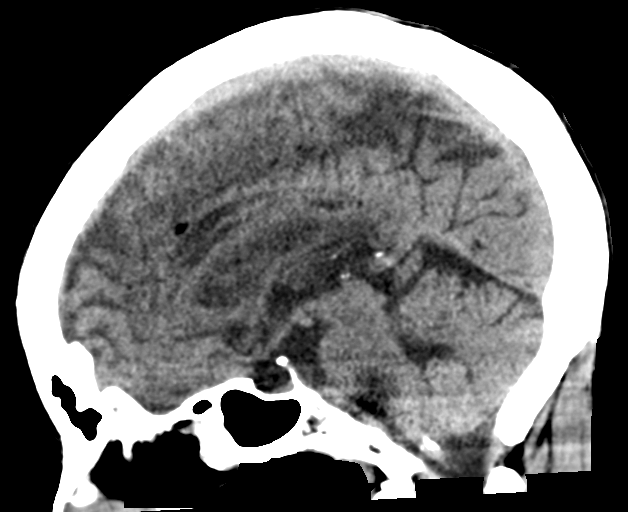
[im 40/60  brain]
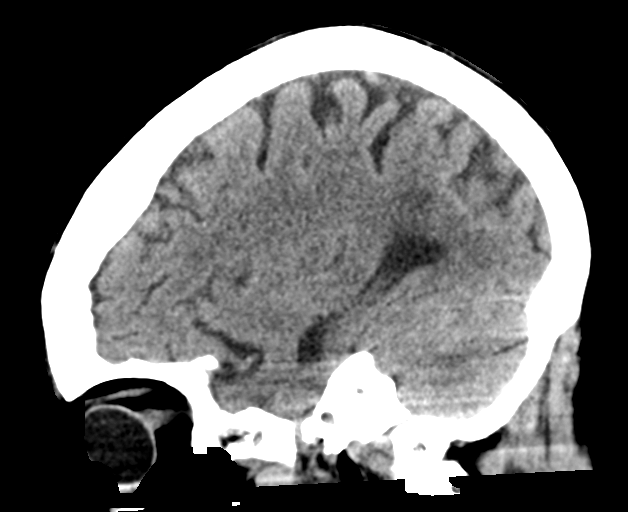

[15 of 47 positions shown; findings below may reference images not displayed]

FINDINGS: Brain: Normal anatomic configuration. Parenchymal volume loss is
commensurate with the patient's age. Mild periventricular white
matter changes are present likely reflecting the sequela of small
vessel ischemia. No abnormal intra or extra-axial mass lesion or
fluid collection. No abnormal mass effect or midline shift. No
evidence of acute intracranial hemorrhage or infarct. Ventricular
size is normal. Cerebellum unremarkable.

Vascular: No asymmetric hyperdense vasculature at the skull base.

Skull: Intact

Sinuses/Orbits: There is moderate mucosal thickening within the
right frontal sinus, several ethmoid air cells and to a lesser
degree within the right maxillary sinus. No air-fluid levels. Orbits
are unremarkable.

Other: Mastoid air cells and middle ear cavities are clear.
IMPRESSION: No acute intracranial abnormality.  Mild senescent change.

Moderate paranasal sinus disease.

## 2020-10-11 MED ORDER — SODIUM CHLORIDE 0.9 % IV BOLUS
1000.0000 mL | Freq: Once | INTRAVENOUS | Status: AC
  Administered 2020-10-11: 1000 mL via INTRAVENOUS

## 2020-10-11 MED ORDER — HEPARIN SOD (PORK) LOCK FLUSH 100 UNIT/ML IV SOLN
100.0000 [IU] | Freq: Once | INTRAVENOUS | Status: AC
  Administered 2020-10-11: 100 [IU]
  Filled 2020-10-11: qty 5

## 2020-10-11 NOTE — ED Notes (Signed)
Port de-accessed

## 2020-10-11 NOTE — Discharge Instructions (Addendum)
Drink plenty of fluids and follow-up with your doctor as planned this week

## 2020-10-11 NOTE — ED Notes (Signed)
Pt ambulatory to restroom w/out assistance.

## 2020-10-11 NOTE — ED Provider Notes (Signed)
[facility] DEPT Provider Note   CSN: [id number] Arrival date & time: 10/11/20  1855     History Chief Complaint  Patient presents with  . Dizziness    Angelica [name] is a 80 y.o. female.  Patient has a history of lung cancer and is treated by Dr. Earlie Server.  She presented today with some dizziness and weakness.  No fever no chills no cough no vomiting  The history is provided by the patient and medical records. No language interpreter was used.  Dizziness Quality:  Lightheadedness Severity:  Mild Onset quality:  Sudden Progression:  Waxing and waning Chronicity:  New Context: not when bending over   Relieved by:  Nothing Associated symptoms: no chest pain, no diarrhea and no headaches        Past Medical History:  Diagnosis Date  . Arthritis   . Asthma   . Bowel obstruction ([place])   . Environmental allergies   . GERD (gastroesophageal reflux disease)   . Glaucoma 05/09/2017  . H. pylori infection   . History of radiation therapy 07/05/20-08/05/20   Left lung IMRT Dr. Sondra Come   . HLD (hyperlipidemia) 01/16/2019  . Hypertension   . Iron deficiency anemia   . Osteopenia 05/26/2017  . Thyroid disease   . Urticaria 07/25/2018    Patient Active Problem List   Diagnosis Date Noted  . Encounter for antineoplastic immunotherapy 08/31/2020  . Adenocarcinoma of left lung, stage 3 ([place]) 06/22/2020  . Encounter for antineoplastic chemotherapy 06/22/2020  . Lung mass   . Adenopathy 06/09/2020  . Epigastric abdominal tenderness with rebound tenderness 05/17/2020  . Pulmonary nodule 05/17/2020  . Iron deficiency 05/03/2020  . Dizziness 01/19/2020  . Left rotator cuff tear 11/12/2019  . Trigger thumb of right hand 11/12/2019  . Trigger finger, left ring finger 11/12/2019  . Left shoulder pain 09/29/2019  . Swelling of lower extremity 07/30/2019  . Greater trochanteric bursitis of left hip 04/07/2019  . Right ankle swelling 02/18/2019  .  Ganglion cyst of right foot 01/16/2019  . HLD (hyperlipidemia) 01/16/2019  . 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 Urticaria 07/25/2018  . Low back pain 07/09/2018  . Hyperglycemia 07/09/2018  . Rash 10/10/2017  . Constipation 08/19/2017  . Hypokalemia 07/09/2017  . Asthma, severe persistent, well-controlled 06/04/2017  . 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 Nasal polyposis 06/04/2017  . Leukocytosis 06/04/2017  . Osteopenia 05/26/2017  . Encounter for well adult exam with abnormal findings 05/09/2017  . Glaucoma 05/09/2017  . GERD (gastroesophageal reflux disease)   . H. pylori infection   . Other allergic rhinitis 03/20/2016  . Cough, persistent 03/20/2016  . Hypoglycemia 04/17/2015  . Small bowel obstruction ([place])   . Malnutrition of moderate degree 04/11/2015  . SBO (small bowel obstruction) ([place]) 04/10/2015  . HTN (hypertension) 04/10/2015    Past Surgical History:  Procedure Laterality Date  . ABDOMINAL HYSTERECTOMY    . BOWEL RESECTION N/A 05/23/2020   Procedure: SMALL BOWEL REPAIR;  Surgeon: Kinsinger, Arta Bruce, MD;  Location: [facility];  Service: General;  Laterality: N/A;  . BREAST BIOPSY    . BRONCHIAL BIOPSY  06/10/2020   Procedure: BRONCHIAL BIOPSIES;  Surgeon: Garner Nash, DO;  Location: [facility];  Service: Pulmonary;;  . BRONCHIAL BRUSHINGS  06/10/2020   Procedure: BRONCHIAL BRUSHINGS;  Surgeon: Garner Nash, DO;  Location: [facility];  Service: Pulmonary;;  . BRONCHIAL NEEDLE ASPIRATION BIOPSY  06/10/2020   Procedure: BRONCHIAL NEEDLE ASPIRATION BIOPSIES;  Surgeon:  Garner Nash, DO;  Location: [facility];  Service: Pulmonary;;  . BRONCHIAL WASHINGS  06/10/2020   Procedure: BRONCHIAL WASHINGS;  Surgeon: Garner Nash, DO;  Location: [facility];  Service: Pulmonary;;  . COLON SURGERY    . DIAGNOSTIC LARYNGOSCOPY N/A 05/23/2020   Procedure: ATTEMPTED DIAGNOSTIC LAPAROSCOPY WITH ADHESIONS;  Surgeon: Kieth Brightly, Arta Bruce, MD;   Location: [facility];  Service: General;  Laterality: N/A;  . IR IMAGING GUIDED PORT INSERTION  07/15/2020  . KNEE SURGERY    . LAPAROTOMY N/A 04/18/2015   Procedure: Exploratory laparotomy with lysis of adhesions, possible bowel resection;  Surgeon: Ralene Ok, MD;  Location: [facility];  Service: General;  Laterality: N/A;  . LAPAROTOMY N/A 05/23/2020   Procedure: EXPLORATORY LAPAROTOMY;  Surgeon: Kieth Brightly Arta Bruce, MD;  Location: [facility];  Service: General;  Laterality: N/A;  . LYSIS OF ADHESION N/A 05/23/2020   Procedure: LYSIS OF ADHESION;  Surgeon: Kieth Brightly Arta Bruce, MD;  Location: [facility];  Service: General;  Laterality: N/A;  . NASAL SINUS SURGERY    . THYROID SURGERY    . VIDEO BRONCHOSCOPY WITH ENDOBRONCHIAL NAVIGATION Bilateral 06/10/2020   Procedure: VIDEO BRONCHOSCOPY WITH ENDOBRONCHIAL NAVIGATION;  Surgeon: Garner Nash, DO;  Location: [facility];  Service: Pulmonary;  Laterality: Bilateral;  . VIDEO BRONCHOSCOPY WITH ENDOBRONCHIAL ULTRASOUND  06/10/2020   Procedure: VIDEO BRONCHOSCOPY WITH ENDOBRONCHIAL ULTRASOUND;  Surgeon: Garner Nash, DO;  Location: [facility];  Service: Pulmonary;;     OB History   No obstetric history on file.     Family History  Problem Relation Age of Onset  . Diabetes Mother   . Hypertension Mother   . Hypertension Father   . Glaucoma Sister   . Glaucoma Brother   . Allergic rhinitis Neg Hx   . Angioedema Neg Hx   . Asthma Neg Hx   . Eczema Neg Hx   . Immunodeficiency Neg Hx   . Urticaria Neg Hx     Social History   Tobacco Use  . Smoking status: Former Smoker    Types: Cigarettes  . Smokeless tobacco: Never Used  . Tobacco comment: quit 60 years ago  Vaping Use  . Vaping Use: Never used  Substance Use Topics  . Alcohol use: No  . Drug use: No    Home Medications Prior to Admission medications   Medication Sig Start Date End Date Taking? Authorizing Provider  acetaminophen (TYLENOL) 325 MG tablet Take 2 tablets (650 mg  total) by mouth every 6 (six) hours as needed for headache. Patient taking differently: Take 325 mg by mouth every 6 (six) hours as needed for headache or mild pain. 06/03/20   Saverio Danker, PA-C  amLODipine (NORVASC) 10 MG tablet Take 1 tablet by mouth once daily 08/08/20   [name], MD  Benralizumab [facility] PEN) 30 MG/ML SOAJ Inject into the skin every 8 (eight) weeks.    [provider]  budesonide-formoterol (SYMBICORT) 80-4.5 MCG/ACT inhaler Inhale 2 puffs into the lungs 2 (two) times daily. Use for asthma flares 2 puffs twice a day for 2 weeks or until cough and wheeze free Patient taking differently: Inhale 2 puffs into the lungs 2 (two) times daily as needed (for asthma flares, for 2 weeks, or until cough and wheeze-free). 01/28/20   Kennith Gain, MD  Cholecalciferol (VITAMIN D3) 50 MCG (2000 UT) TABS Take 2,000 Units by mouth daily.    [provider]  EPINEPHrine (EPIPEN 2-PAK) 0.3 mg/0.3 mL IJ SOAJ  injection Use as directed for severe allergic reaction Patient taking differently: Inject 0.3 mg into the muscle as needed for anaphylaxis. 01/28/20   Kennith Gain, MD  ipratropium (ATROVENT) 0.06 % nasal spray Place 2 sprays into both nostrils 3 (three) times daily. 07/28/20   Kennith Gain, MD  linaclotide (LINZESS) 72 MCG capsule TAKE 1 CAPSULE BY MOUTH ONCE DAILY AS NEEDED Patient taking differently: Take 72 mcg by mouth daily as needed (constipation). 06/14/20   [name], MD  loratadine (CLARITIN) 10 MG tablet Take 10 mg by mouth daily.    [provider]  losartan (COZAAR) 100 MG tablet Take 100 mg by mouth daily.    [provider]  Magnesium Hydroxide (PHILLIPS MILK OF MAGNESIA PO) Take 15-30 mLs by mouth daily as needed (for constipation).    [provider]  montelukast (SINGULAIR) 10 MG tablet Take 1 tablet (10 mg total) by mouth at bedtime. 08/15/20   Kennith Gain, MD  pravastatin  (PRAVACHOL) 80 MG tablet Take 1 tablet (80 mg total) by mouth daily. 06/14/20   [name], MD  prochlorperazine (COMPAZINE) 10 MG tablet Take 1 tablet (10 mg total) by mouth every 6 (six) hours as needed for nausea or vomiting. 06/22/20   Curt Bears, MD  telmisartan (MICARDIS) 80 MG tablet Take 1 tablet (80 mg total) by mouth daily. 07/14/20   [name], MD  traMADol (ULTRAM) 50 MG tablet Take 1 tablet (50 mg total) by mouth every 6 (six) hours as needed for moderate pain. 08/01/20   [name], MD  vitamin B-12 (CYANOCOBALAMIN) 1000 MCG tablet Take 1,000 mcg by mouth daily.    [provider]    Allergies    Asa buff (mag [buffered aspirin], Ensure [nutritional supplements], Fish allergy, Milk-related compounds, Other, Peanut-containing drug products, Penicillins, Shellfish-derived products, and Strawberry extract  Review of Systems   Review of Systems  Constitutional: Negative for appetite change and fatigue.  HENT: Negative for congestion, ear discharge and sinus pressure.   Eyes: Negative for discharge.  Respiratory: Negative for cough.   Cardiovascular: Negative for chest pain.  Gastrointestinal: Negative for abdominal pain and diarrhea.  Genitourinary: Negative for frequency and hematuria.  Musculoskeletal: Negative for back pain.  Skin: Negative for rash.  Neurological: Positive for dizziness. Negative for seizures and headaches.  Psychiatric/Behavioral: Negative for hallucinations.    Physical Exam Updated Vital Signs BP 129/71   Pulse 84   Temp 98.2 F (36.8 C) (Oral)   Resp 14   SpO2 100%   Physical Exam Vitals and nursing note reviewed.  Constitutional:      Appearance: She is well-developed.  HENT:     Head: Normocephalic.     Mouth/Throat:     Comments: Dry mucous membrane Eyes:     General: No scleral icterus.    Conjunctiva/sclera: Conjunctivae normal.  Neck:     Thyroid: No thyromegaly.  Cardiovascular:     Rate and Rhythm: Normal  rate and regular rhythm.     Heart sounds: No murmur heard. No friction rub. No gallop.   Pulmonary:     Breath sounds: No stridor. No wheezing or rales.  Chest:     Chest wall: No tenderness.  Abdominal:     General: There is no distension.     Tenderness: There is no abdominal tenderness. There is no rebound.  Musculoskeletal:        General: Normal range of motion.  Cervical back: Neck supple.  Lymphadenopathy:     Cervical: No cervical adenopathy.  Skin:    Findings: No erythema or rash.  Neurological:     Mental Status: She is alert and oriented to person, place, and time.     Motor: No abnormal muscle tone.     Coordination: Coordination normal.  Psychiatric:        Behavior: Behavior normal.     ED Results / Procedures / Treatments   Labs (all labs ordered are listed, but only abnormal results are displayed) Labs Reviewed  COMPREHENSIVE METABOLIC PANEL - Abnormal; Notable for the following components:      Result Value   Glucose, Bld 108 (*)    All other components within normal limits  CBC WITH DIFFERENTIAL/PLATELET - Abnormal; Notable for the following components:   RBC 3.70 (*)    Hemoglobin 11.6 (*)    HCT 34.3 (*)    All other components within normal limits  URINALYSIS, ROUTINE W REFLEX MICROSCOPIC - Abnormal; Notable for the following components:   Color, Urine COLORLESS (*)    Specific Gravity, Urine 1.002 (*)    All other components within normal limits  URINE CULTURE    EKG None  Radiology CT Head Wo Contrast  Result Date: 10/11/2020 CLINICAL DATA:  Dizziness, lung cancer EXAM: CT HEAD WITHOUT CONTRAST TECHNIQUE: Contiguous axial images were obtained from the base of the skull through the vertex without intravenous contrast. COMPARISON:  None. FINDINGS: Brain: Normal anatomic configuration. Parenchymal volume loss is commensurate with the patient's age. Mild periventricular white matter changes are present likely reflecting the sequela of small  vessel ischemia. No abnormal intra or extra-axial mass lesion or fluid collection. No abnormal mass effect or midline shift. No evidence of acute intracranial hemorrhage or infarct. Ventricular size is normal. Cerebellum unremarkable. Vascular: No asymmetric hyperdense vasculature at the skull base. Skull: Intact Sinuses/Orbits: There is moderate mucosal thickening within the right frontal sinus, several ethmoid air cells and to a lesser degree within the right maxillary sinus. No air-fluid levels. Orbits are unremarkable. Other: Mastoid air cells and middle ear cavities are clear. IMPRESSION: No acute intracranial abnormality.  Mild senescent change. Moderate paranasal sinus disease. Electronically Signed   By: Fidela Salisbury MD   On: 10/11/2020 21:33   DG Chest Port 1 View  Result Date: 10/11/2020 CLINICAL DATA:  80 year old female with weakness. EXAM: PORTABLE CHEST 1 VIEW COMPARISON:  Chest radiograph dated 06/10/2020 and CT dated 08/29/2020. FINDINGS: Right-sided Port-A-Cath with tip over central SVC. The lungs are clear. There is no pleural effusion pneumothorax. The cardiac silhouette is within limits. Atherosclerotic calcification of the aorta. No acute osseous pathology. IMPRESSION: No active cardiopulmonary disease. Electronically Signed   By: Anner Crete M.D.   On: 10/11/2020 20:29    Procedures Procedures   Medications Ordered in ED Medications  sodium chloride 0.9 % bolus 1,000 mL (1,000 mLs Intravenous New Bag/Given 10/11/20 2054)    ED Course  I have reviewed the triage vital signs and the nursing notes.  Pertinent labs & imaging results that were available during my care of the patient were reviewed by me and considered in my medical decision making (see chart for details).    MDM Rules/Calculators/A&P                         Patient with mild dehydration causing her dizziness.  Patient improved with IV fluids will follow-up with her PCP Final Clinical  Impression(s) / ED  Diagnoses Final diagnoses:  Dizziness    Rx / DC Orders ED Discharge Orders    None       Milton Ferguson, MD 10/11/20 2222

## 2020-10-11 NOTE — ED Notes (Signed)
Port accessed. Patient is sweet and pleasant. Daughter is at beside.

## 2020-10-11 NOTE — ED Provider Notes (Signed)
Emergency Medicine Provider Triage Evaluation Note  Angelica [name] , a 80 y.o. female  was evaluated in triage.  Pt complains of dizziness, fatigue and lightheadedness.  Symptoms began yesterday but worsened today about 10 hours ago.  States that she has been having pain in her left flank area as well.  She is undergoing immunotherapy for cancer with last session being 1 week ago.  Denies any chest pain, injuries or falls.  No vision changes, vomiting or diarrhea.  She did complete physical therapy for her right shoulder this morning.  Review of Systems  Positive: Lightheadedness, dizziness Negative: Chest pain  Physical Exam  BP 136/76 (BP Location: Left Arm)   Pulse 82   Temp 98.2 F (36.8 C) (Oral)   Resp 15   SpO2 100%  Gen:   Awake, no distress   Resp:  Normal effort  MSK:   Moves extremities without difficulty  Other:  Strength 5/5 in bilateral upper and lower extremities.  Medical Decision Making  Medically screening exam initiated at 7:26 PM.  Appropriate orders placed.  Angelica [name] was informed that the remainder of the evaluation will be completed by another provider, this initial triage assessment does not replace that evaluation, and the importance of remaining in the ED until their evaluation is complete.  Will obtain lab work, EKG.   Delia Heady, PA-C 10/11/20 1938    Lacretia Leigh, MD 10/14/20 [phone]

## 2020-10-11 NOTE — ED Triage Notes (Signed)
Pt presents from home with daughter, c/o dizziness throughout the day today worse when ambulating. Denies n/v. Currently being treated with immunotherapy for lung CA

## 2020-10-13 ENCOUNTER — Ambulatory Visit (INDEPENDENT_AMBULATORY_CARE_PROVIDER_SITE_OTHER): Payer: Medicare HMO | Admitting: Internal Medicine

## 2020-10-13 ENCOUNTER — Encounter: Payer: Self-pay | Admitting: Internal Medicine

## 2020-10-13 ENCOUNTER — Other Ambulatory Visit: Payer: Self-pay

## 2020-10-13 VITALS — BP 128/76 | HR 86 | Temp 98.0°F | Ht 65.0 in | Wt 150.0 lb

## 2020-10-13 DIAGNOSIS — C3492 Malignant neoplasm of unspecified part of left bronchus or lung: Secondary | ICD-10-CM | POA: Diagnosis not present

## 2020-10-13 DIAGNOSIS — R42 Dizziness and giddiness: Secondary | ICD-10-CM | POA: Diagnosis not present

## 2020-10-13 DIAGNOSIS — Z0001 Encounter for general adult medical examination with abnormal findings: Secondary | ICD-10-CM | POA: Diagnosis not present

## 2020-10-13 DIAGNOSIS — M25512 Pain in left shoulder: Secondary | ICD-10-CM | POA: Diagnosis not present

## 2020-10-13 DIAGNOSIS — K219 Gastro-esophageal reflux disease without esophagitis: Secondary | ICD-10-CM

## 2020-10-13 DIAGNOSIS — J4550 Severe persistent asthma, uncomplicated: Secondary | ICD-10-CM

## 2020-10-13 DIAGNOSIS — G8929 Other chronic pain: Secondary | ICD-10-CM

## 2020-10-13 DIAGNOSIS — I10 Essential (primary) hypertension: Secondary | ICD-10-CM | POA: Diagnosis not present

## 2020-10-13 LAB — URINE CULTURE: Culture: NO GROWTH

## 2020-10-13 NOTE — Progress Notes (Deleted)
Patient ID: Angelica [name], female   DOB: [birth year], 80 y.o.   MRN: [id number]

## 2020-10-13 NOTE — Progress Notes (Signed)
Patient ID: Angelica [name], female   DOB: January 22, 1941, 80 y.o.   MRN: [id number]         Chief Complaint:: wellness exam and Dizziness  , wt loss, gerd, low tsh       HPI:  Angelica [name] is a 80 y.o. female here for wellness exam; declines shingrix, second covid booster, tdap, pneumovax, o/w up to date with preventive referrals and immunizations.                        Also had recent episode of dizziness due to low volume, now resolved after IVFs.  Has low appetite and wt loss.  Has had mild worsening reflux, but no abd pain, dysphagia, n/v, bowel change or blood, and wary of new meds as she is on immunotherapy with low TSH.  Denies hyper or hypo thyroid symptoms such as voice, skin or hair change.  Has HH with PT but asking for Nurse as well.  Getting PT now for left shoulder and overall improved.  Pt denies chest pain, increased sob or doe, wheezing, orthopnea, PND, increased LE swelling, palpitations, dizziness or syncope.   Pt denies polydipsia, polyuria, or new focal neuro s/s.   Pt denies fever, wt loss, night sweats, loss of appetite, or other constitutional symptoms  Wt Readings from Last 3 Encounters:  10/13/20 150 lb (68 kg)  10/05/20 149 lb 11.2 oz (67.9 kg)  09/28/20 155 lb (70.3 kg)   BP Readings from Last 3 Encounters:  10/13/20 128/76  10/11/20 (!) 122/102  10/05/20 131/72   Immunization History  Administered Date(s) Administered  . Fluad Quad(high Dose 65+) 01/16/2019, 03/05/2020  . Influenza, High Dose Seasonal PF 01/30/2018  . PFIZER(Purple Top)SARS-COV-2 Vaccination 07/13/2019, 08/03/2019, 02/16/2020  . Pneumococcal Conjugate-13 11/08/2017   There are no preventive care reminders to display for this patient.    Past Medical History:  Diagnosis Date  . Arthritis   . Asthma   . Bowel obstruction ([place])   . Environmental allergies   . GERD (gastroesophageal reflux disease)   . Glaucoma 05/09/2017  . H. pylori infection   . History of radiation therapy  07/05/20-08/05/20   Left lung IMRT Dr. Sondra Come   . HLD (hyperlipidemia) 01/16/2019  . Hypertension   . Iron deficiency anemia   . Osteopenia 05/26/2017  . Thyroid disease   . Urticaria 07/25/2018   Past Surgical History:  Procedure Laterality Date  . ABDOMINAL HYSTERECTOMY    . BOWEL RESECTION N/A 05/23/2020   Procedure: SMALL BOWEL REPAIR;  Surgeon: Kinsinger, Arta Bruce, MD;  Location: [facility];  Service: General;  Laterality: N/A;  . BREAST BIOPSY    . BRONCHIAL BIOPSY  06/10/2020   Procedure: BRONCHIAL BIOPSIES;  Surgeon: Garner Nash, DO;  Location: [facility];  Service: Pulmonary;;  . BRONCHIAL BRUSHINGS  06/10/2020   Procedure: BRONCHIAL BRUSHINGS;  Surgeon: Garner Nash, DO;  Location: [facility];  Service: Pulmonary;;  . BRONCHIAL NEEDLE ASPIRATION BIOPSY  06/10/2020   Procedure: BRONCHIAL NEEDLE ASPIRATION BIOPSIES;  Surgeon: Garner Nash, DO;  Location: [facility];  Service: Pulmonary;;  . BRONCHIAL WASHINGS  06/10/2020   Procedure: BRONCHIAL WASHINGS;  Surgeon: Garner Nash, DO;  Location: [facility];  Service: Pulmonary;;  . COLON SURGERY    . DIAGNOSTIC LARYNGOSCOPY N/A 05/23/2020   Procedure: ATTEMPTED DIAGNOSTIC LAPAROSCOPY WITH ADHESIONS;  Surgeon: Kieth Brightly, Arta Bruce, MD;  Location: [facility];  Service: General;  Laterality: N/A;  . IR IMAGING  GUIDED PORT INSERTION  07/15/2020  . KNEE SURGERY    . LAPAROTOMY N/A 04/18/2015   Procedure: Exploratory laparotomy with lysis of adhesions, possible bowel resection;  Surgeon: Ralene Ok, MD;  Location: [facility];  Service: General;  Laterality: N/A;  . LAPAROTOMY N/A 05/23/2020   Procedure: EXPLORATORY LAPAROTOMY;  Surgeon: Kieth Brightly Arta Bruce, MD;  Location: [facility];  Service: General;  Laterality: N/A;  . LYSIS OF ADHESION N/A 05/23/2020   Procedure: LYSIS OF ADHESION;  Surgeon: Kieth Brightly Arta Bruce, MD;  Location: [facility];  Service: General;  Laterality: N/A;  . NASAL SINUS SURGERY    . THYROID SURGERY    . VIDEO  BRONCHOSCOPY WITH ENDOBRONCHIAL NAVIGATION Bilateral 06/10/2020   Procedure: VIDEO BRONCHOSCOPY WITH ENDOBRONCHIAL NAVIGATION;  Surgeon: Garner Nash, DO;  Location: [facility];  Service: Pulmonary;  Laterality: Bilateral;  . VIDEO BRONCHOSCOPY WITH ENDOBRONCHIAL ULTRASOUND  06/10/2020   Procedure: VIDEO BRONCHOSCOPY WITH ENDOBRONCHIAL ULTRASOUND;  Surgeon: Garner Nash, DO;  Location: [facility];  Service: Pulmonary;;    reports that she has quit smoking. Her smoking use included cigarettes. She has never used smokeless tobacco. She reports that she does not drink alcohol and does not use drugs. family history includes Diabetes in her mother; Glaucoma in her brother and sister; Hypertension in her father and mother. Allergies  Allergen Reactions  . Asa Buff (Mag [Buffered Aspirin] Other (See Comments)    Excessive sweating  . Ensure [Nutritional Supplements]     Or boost - upset stomach   . Fish Allergy Other (See Comments)    Unknown reaction, but allergic  . Milk-Related Compounds     Doesn't drink milk but can eat cheese   . Other Other (See Comments)    Gelcaps; gel-containing capsules; extended-release medication - upset stomach   . Peanut-Containing Drug Products Other (See Comments)    From allergy test  . Penicillins Itching    Has patient had a PCN reaction causing immediate rash, facial/tongue/throat swelling, SOB or lightheadedness with hypotension: No Has patient had a PCN reaction causing severe rash involving mucus membranes or skin necrosis: No Has patient had a PCN reaction that required hospitalization No Has patient had a PCN reaction occurring within the last 10 years: No If all of the above answers are "NO", then may proceed with Cephalosporin use.  Marland Kitchen Shellfish-Derived Products Other (See Comments)    From allergy test  . Strawberry Extract Other (See Comments)    From allergy test    Current Outpatient Medications on File Prior to Visit  Medication Sig  Dispense Refill  . acetaminophen (TYLENOL) 325 MG tablet Take 2 tablets (650 mg total) by mouth every 6 (six) hours as needed for headache. (Patient taking differently: Take 325 mg by mouth every 6 (six) hours as needed for headache or mild pain.)    . amLODipine (NORVASC) 10 MG tablet Take 1 tablet by mouth once daily 90 tablet 0  . Benralizumab (FASENRA PEN) 30 MG/ML SOAJ Inject into the skin every 8 (eight) weeks.    . budesonide-formoterol (SYMBICORT) 80-4.5 MCG/ACT inhaler Inhale 2 puffs into the lungs 2 (two) times daily. Use for asthma flares 2 puffs twice a day for 2 weeks or until cough and wheeze free (Patient taking differently: Inhale 2 puffs into the lungs 2 (two) times daily as needed (for asthma flares, for 2 weeks, or until cough and wheeze-free).) 10.2 g 5  . Cholecalciferol (VITAMIN D3) 50 MCG (2000 UT) TABS Take 2,000 Units by  mouth daily.    Marland Kitchen EPINEPHrine (EPIPEN 2-PAK) 0.3 mg/0.3 mL IJ SOAJ injection Use as directed for severe allergic reaction (Patient taking differently: Inject 0.3 mg into the muscle as needed for anaphylaxis.) 2 each 1  . ipratropium (ATROVENT) 0.06 % nasal spray Place 2 sprays into both nostrils 3 (three) times daily. 15 mL 3  . linaclotide (LINZESS) 72 MCG capsule TAKE 1 CAPSULE BY MOUTH ONCE DAILY AS NEEDED (Patient taking differently: Take 72 mcg by mouth daily as needed (constipation).) 30 capsule 0  . loratadine (CLARITIN) 10 MG tablet Take 10 mg by mouth daily.    Marland Kitchen losartan (COZAAR) 100 MG tablet Take 100 mg by mouth daily.    . Magnesium Hydroxide (PHILLIPS MILK OF MAGNESIA PO) Take 15-30 mLs by mouth daily as needed (for constipation).    . montelukast (SINGULAIR) 10 MG tablet Take 1 tablet (10 mg total) by mouth at bedtime. (Patient taking differently: Take 10 mg by mouth as needed.) 30 tablet 5  . pravastatin (PRAVACHOL) 80 MG tablet Take 1 tablet (80 mg total) by mouth daily. 90 tablet 3  . prochlorperazine (COMPAZINE) 10 MG tablet Take 1 tablet (10  mg total) by mouth every 6 (six) hours as needed for nausea or vomiting. 30 tablet 0  . telmisartan (MICARDIS) 80 MG tablet Take 1 tablet (80 mg total) by mouth daily. 90 tablet 3  . traMADol (ULTRAM) 50 MG tablet Take 1 tablet (50 mg total) by mouth every 6 (six) hours as needed for moderate pain. 30 tablet 2  . vitamin B-12 (CYANOCOBALAMIN) 1000 MCG tablet Take 1,000 mcg by mouth daily.     Current Facility-Administered Medications on File Prior to Visit  Medication Dose Route Frequency Provider Last Rate Last Admin  . Benralizumab SOSY 30 mg  30 mg Subcutaneous Q8 Weeks Kennith Gain, MD   30 mg at 08/30/20 1129        ROS:  All others reviewed and negative.  Objective        PE:  BP 128/76 (BP Location: Right Arm, Patient Position: Sitting, Cuff Size: Normal)   Pulse 86   Temp 98 F (36.7 C) (Oral)   Ht 5\' 5"  (1.651 m)   Wt 150 lb (68 kg)   SpO2 100%   BMI 24.96 kg/m                 Constitutional: Pt appears in NAD               HENT: Head: NCAT.                Right Ear: External ear normal.                 Left Ear: External ear normal.                Eyes: . Pupils are equal, round, and reactive to light. Conjunctivae and EOM are normal               Nose: without d/c or deformity               Neck: Neck supple. Gross normal ROM               Cardiovascular: Normal rate and regular rhythm.                 Pulmonary/Chest: Effort normal and breath sounds without rales or wheezing.  Abd:  Soft, NT, ND, + BS, no organomegaly               Neurological: Pt is alert. At baseline orientation, motor grossly intact               Skin: Skin is warm. No rashes, no other new lesions, LE edema - none               Psychiatric: Pt behavior is normal without agitation   Micro: none  Cardiac tracings I have personally interpreted today:  none  Pertinent Radiological findings (summarize): none   Lab Results  Component Value Date   WBC 10.1 10/11/2020    HGB 11.6 (L) 10/11/2020   HCT 34.3 (L) 10/11/2020   PLT 192 10/11/2020   GLUCOSE 108 (H) 10/11/2020   CHOL 208 (H) 05/03/2020   TRIG 99 05/30/2020   HDL 69.90 05/03/2020   LDLCALC 120 (H) 05/03/2020   ALT 15 10/11/2020   AST 18 10/11/2020   NA 136 10/11/2020   K 4.1 10/11/2020   CL 105 10/11/2020   CREATININE 0.86 10/11/2020   BUN 13 10/11/2020   CO2 25 10/11/2020   TSH <0.080 (L) 10/05/2020   HGBA1C 5.8 05/03/2020   Assessment/Plan:  Angelica [name] is a 80 y.o. Black or African American [2] female with  has a past medical history of Arthritis, Asthma, Bowel obstruction ([place]), Environmental allergies, GERD (gastroesophageal reflux disease), Glaucoma (05/09/2017), H. pylori infection, History of radiation therapy (07/05/20-08/05/20), HLD (hyperlipidemia) (01/16/2019), Hypertension, Iron deficiency anemia, Osteopenia (05/26/2017), Thyroid disease, and Urticaria (07/25/2018).  Encounter for well adult exam with abnormal findings Age and sex appropriate education and counseling updated with regular exercise and diet Referrals for preventative services - none needed Immunizations addressed - declines all imunizations Smoking counseling  - none needed Evidence for depression or other mood disorder - none significant Most recent labs reviewed. I have personally reviewed and have noted: 1) the patient's medical and social history 2) The patient's current medications and supplements 3) The patient's height, weight, and BMI have been recorded in the chart   Adenocarcinoma of left lung, stage 3 ([place]) To continue f/u with oncology with immunotherapy, ok for [facility] nurse in addition to the PT  HTN (hypertension) BP Readings from Last 3 Encounters:  10/13/20 128/76  10/11/20 (!) 122/102  10/05/20 131/72   Stable, pt to continue medical treatment norvasc, losartan   GERD (gastroesophageal reflux disease) Mild, no red flags, for mylanta or pepcid otc prn  Asthma, severe persistent,  well-controlled Stable overall, cont same tx - symbicort, atrovent, singulair   Left shoulder pain Improved, to cont PT  Dizziness Recent due to low volume now resolved,  to f/u any worsening symptoms or concerns  Followup: Return in about 3 months (around 01/13/2021), or if symptoms worsen or fail to improve.  Cathlean Cower, MD 10/16/2020 3:58 PM [facility]

## 2020-10-13 NOTE — Patient Instructions (Signed)
Ok to try the Boost every day for the weight loss  Ok to try the Mylanta or Pepcid for reflux  Be sure to keep up the fluids as you do  You will be contacted regarding the referral for: Home Health for the Nursing part  Please continue all other medications as before, and refills have been done if requested.  Please have the pharmacy call with any other refills you may need.  Please continue your efforts at being more active, low cholesterol diet, and weight control.  You are otherwise up to date with prevention measures today.  Please keep your appointments with your specialists as you may have planned - Oncology as you have planned  No need for further lab work today  Please make an Appointment to return in 3 months

## 2020-10-16 ENCOUNTER — Emergency Department (HOSPITAL_COMMUNITY)
Admission: EM | Admit: 2020-10-16 | Discharge: 2020-10-17 | Disposition: A | Discharge status: Home / Self Care | Payer: Medicare HMO | Source: Home / Self Care | Attending: Emergency Medicine | Admitting: Emergency Medicine

## 2020-10-16 ENCOUNTER — Encounter (HOSPITAL_COMMUNITY): Payer: Self-pay

## 2020-10-16 ENCOUNTER — Other Ambulatory Visit: Payer: Self-pay

## 2020-10-16 ENCOUNTER — Emergency Department (HOSPITAL_COMMUNITY): Payer: Medicare HMO

## 2020-10-16 ENCOUNTER — Encounter: Payer: Self-pay | Admitting: Internal Medicine

## 2020-10-16 DIAGNOSIS — I10 Essential (primary) hypertension: Secondary | ICD-10-CM | POA: Insufficient documentation

## 2020-10-16 DIAGNOSIS — Z923 Personal history of irradiation: Secondary | ICD-10-CM | POA: Diagnosis not present

## 2020-10-16 DIAGNOSIS — Z85118 Personal history of other malignant neoplasm of bronchus and lung: Secondary | ICD-10-CM | POA: Insufficient documentation

## 2020-10-16 DIAGNOSIS — Z8719 Personal history of other diseases of the digestive system: Secondary | ICD-10-CM | POA: Insufficient documentation

## 2020-10-16 DIAGNOSIS — I491 Atrial premature depolarization: Secondary | ICD-10-CM | POA: Diagnosis not present

## 2020-10-16 DIAGNOSIS — R112 Nausea with vomiting, unspecified: Secondary | ICD-10-CM | POA: Insufficient documentation

## 2020-10-16 DIAGNOSIS — Z6824 Body mass index (BMI) 24.0-24.9, adult: Secondary | ICD-10-CM | POA: Diagnosis not present

## 2020-10-16 DIAGNOSIS — R1111 Vomiting without nausea: Secondary | ICD-10-CM | POA: Diagnosis not present

## 2020-10-16 DIAGNOSIS — R21 Rash and other nonspecific skin eruption: Secondary | ICD-10-CM | POA: Diagnosis not present

## 2020-10-16 DIAGNOSIS — Z9221 Personal history of antineoplastic chemotherapy: Secondary | ICD-10-CM | POA: Diagnosis not present

## 2020-10-16 DIAGNOSIS — R110 Nausea: Secondary | ICD-10-CM | POA: Diagnosis not present

## 2020-10-16 DIAGNOSIS — Z91018 Allergy to other foods: Secondary | ICD-10-CM | POA: Diagnosis not present

## 2020-10-16 DIAGNOSIS — M7592 Shoulder lesion, unspecified, left shoulder: Secondary | ICD-10-CM | POA: Diagnosis not present

## 2020-10-16 DIAGNOSIS — J45909 Unspecified asthma, uncomplicated: Secondary | ICD-10-CM | POA: Insufficient documentation

## 2020-10-16 DIAGNOSIS — Z79899 Other long term (current) drug therapy: Secondary | ICD-10-CM | POA: Insufficient documentation

## 2020-10-16 DIAGNOSIS — Z91010 Allergy to peanuts: Secondary | ICD-10-CM | POA: Insufficient documentation

## 2020-10-16 DIAGNOSIS — Z880 Allergy status to penicillin: Secondary | ICD-10-CM | POA: Diagnosis not present

## 2020-10-16 DIAGNOSIS — J9811 Atelectasis: Secondary | ICD-10-CM | POA: Diagnosis not present

## 2020-10-16 DIAGNOSIS — E785 Hyperlipidemia, unspecified: Secondary | ICD-10-CM | POA: Diagnosis not present

## 2020-10-16 DIAGNOSIS — K219 Gastro-esophageal reflux disease without esophagitis: Secondary | ICD-10-CM | POA: Insufficient documentation

## 2020-10-16 DIAGNOSIS — M7552 Bursitis of left shoulder: Secondary | ICD-10-CM | POA: Diagnosis not present

## 2020-10-16 DIAGNOSIS — R000 Tachycardia, unspecified: Secondary | ICD-10-CM | POA: Diagnosis not present

## 2020-10-16 DIAGNOSIS — M19012 Primary osteoarthritis, left shoulder: Secondary | ICD-10-CM | POA: Diagnosis not present

## 2020-10-16 DIAGNOSIS — Z7951 Long term (current) use of inhaled steroids: Secondary | ICD-10-CM | POA: Diagnosis not present

## 2020-10-16 DIAGNOSIS — Z79891 Long term (current) use of opiate analgesic: Secondary | ICD-10-CM | POA: Diagnosis not present

## 2020-10-16 DIAGNOSIS — Z91013 Allergy to seafood: Secondary | ICD-10-CM | POA: Diagnosis not present

## 2020-10-16 DIAGNOSIS — T451X5A Adverse effect of antineoplastic and immunosuppressive drugs, initial encounter: Secondary | ICD-10-CM | POA: Diagnosis present

## 2020-10-16 DIAGNOSIS — C3492 Malignant neoplasm of unspecified part of left bronchus or lung: Secondary | ICD-10-CM | POA: Diagnosis not present

## 2020-10-16 DIAGNOSIS — K8689 Other specified diseases of pancreas: Secondary | ICD-10-CM | POA: Diagnosis not present

## 2020-10-16 DIAGNOSIS — E871 Hypo-osmolality and hyponatremia: Secondary | ICD-10-CM | POA: Diagnosis not present

## 2020-10-16 DIAGNOSIS — Z87891 Personal history of nicotine dependence: Secondary | ICD-10-CM | POA: Insufficient documentation

## 2020-10-16 DIAGNOSIS — Z886 Allergy status to analgesic agent status: Secondary | ICD-10-CM | POA: Diagnosis not present

## 2020-10-16 DIAGNOSIS — R1032 Left lower quadrant pain: Secondary | ICD-10-CM | POA: Insufficient documentation

## 2020-10-16 DIAGNOSIS — E861 Hypovolemia: Secondary | ICD-10-CM | POA: Diagnosis not present

## 2020-10-16 DIAGNOSIS — R5381 Other malaise: Secondary | ICD-10-CM | POA: Diagnosis present

## 2020-10-16 DIAGNOSIS — R531 Weakness: Secondary | ICD-10-CM | POA: Diagnosis not present

## 2020-10-16 DIAGNOSIS — R109 Unspecified abdominal pain: Secondary | ICD-10-CM

## 2020-10-16 DIAGNOSIS — K8020 Calculus of gallbladder without cholecystitis without obstruction: Secondary | ICD-10-CM | POA: Diagnosis not present

## 2020-10-16 DIAGNOSIS — M170 Bilateral primary osteoarthritis of knee: Secondary | ICD-10-CM | POA: Diagnosis not present

## 2020-10-16 DIAGNOSIS — Z9181 History of falling: Secondary | ICD-10-CM | POA: Diagnosis not present

## 2020-10-16 DIAGNOSIS — R1110 Vomiting, unspecified: Secondary | ICD-10-CM

## 2020-10-16 DIAGNOSIS — R1084 Generalized abdominal pain: Secondary | ICD-10-CM | POA: Diagnosis not present

## 2020-10-16 DIAGNOSIS — E860 Dehydration: Secondary | ICD-10-CM | POA: Diagnosis not present

## 2020-10-16 DIAGNOSIS — R634 Abnormal weight loss: Secondary | ICD-10-CM | POA: Diagnosis not present

## 2020-10-16 DIAGNOSIS — Z888 Allergy status to other drugs, medicaments and biological substances status: Secondary | ICD-10-CM | POA: Diagnosis not present

## 2020-10-16 DIAGNOSIS — E876 Hypokalemia: Secondary | ICD-10-CM | POA: Diagnosis not present

## 2020-10-16 DIAGNOSIS — G8929 Other chronic pain: Secondary | ICD-10-CM | POA: Diagnosis present

## 2020-10-16 DIAGNOSIS — Z20822 Contact with and (suspected) exposure to covid-19: Secondary | ICD-10-CM | POA: Diagnosis not present

## 2020-10-16 DIAGNOSIS — C3490 Malignant neoplasm of unspecified part of unspecified bronchus or lung: Secondary | ICD-10-CM | POA: Diagnosis not present

## 2020-10-16 HISTORY — DX: Malignant (primary) neoplasm, unspecified: C80.1

## 2020-10-16 LAB — URINALYSIS, ROUTINE W REFLEX MICROSCOPIC
Bilirubin Urine: NEGATIVE
Glucose, UA: NEGATIVE mg/dL
Hgb urine dipstick: NEGATIVE
Ketones, ur: NEGATIVE mg/dL
Leukocytes,Ua: NEGATIVE
Nitrite: NEGATIVE
Protein, ur: NEGATIVE mg/dL
Specific Gravity, Urine: 1.008 (ref 1.005–1.030)
pH: 6 (ref 5.0–8.0)

## 2020-10-16 LAB — COMPREHENSIVE METABOLIC PANEL
ALT: 13 U/L (ref 0–44)
AST: 20 U/L (ref 15–41)
Albumin: 3.5 g/dL (ref 3.5–5.0)
Alkaline Phosphatase: 76 U/L (ref 38–126)
Anion gap: 7 (ref 5–15)
BUN: 12 mg/dL (ref 8–23)
CO2: 22 mmol/L (ref 22–32)
Calcium: 9.2 mg/dL (ref 8.9–10.3)
Chloride: 105 mmol/L (ref 98–111)
Creatinine, Ser: 0.7 mg/dL (ref 0.44–1.00)
GFR, Estimated: >60 mL/min (ref >60)
Glucose, Bld: 122 mg/dL — ABNORMAL HIGH (ref 70–99)
Potassium: 3.6 mmol/L (ref 3.5–5.1)
Sodium: 134 mmol/L — ABNORMAL LOW (ref 135–145)
Total Bilirubin: 0.4 mg/dL (ref 0.3–1.2)
Total Protein: 6.7 g/dL (ref 6.5–8.1)

## 2020-10-16 LAB — CBC
HCT: 34.9 % — ABNORMAL LOW (ref 36.0–46.0)
Hemoglobin: 11.5 g/dL — ABNORMAL LOW (ref 12.0–15.0)
MCH: 30.5 pg (ref 26.0–34.0)
MCHC: 33 g/dL (ref 30.0–36.0)
MCV: 92.6 fL (ref 80.0–100.0)
Platelets: 209 10*3/uL (ref 150–400)
RBC: 3.77 MIL/uL — ABNORMAL LOW (ref 3.87–5.11)
RDW: 13.9 % (ref 11.5–15.5)
WBC: 10 10*3/uL (ref 4.0–10.5)
nRBC: 0 % (ref 0.0–0.2)

## 2020-10-16 LAB — LIPASE, BLOOD: Lipase: 24 U/L (ref 11–51)

## 2020-10-16 IMAGING — CT CT ABD-PELV W/O CM
2 of 4 series · 16 of 46 positions shown, 18 images · non-contrast
Comparison: [DATE]

CLINICAL DATA: Mid abdominal pain and vomiting.

History of non-small lung cancer.
EXAM:
CT ABDOMEN AND PELVIS WITHOUT CONTRAST
TECHNIQUE: Multidetector CT imaging of the abdomen and pelvis was performed
following the standard protocol without IV contrast.

[Series 2: axial st · axial · 0.77mm/px · z∈[-449,-54]mm · 13 of 89 slices shown, 15 images]
[im 5/89  soft-tissue]
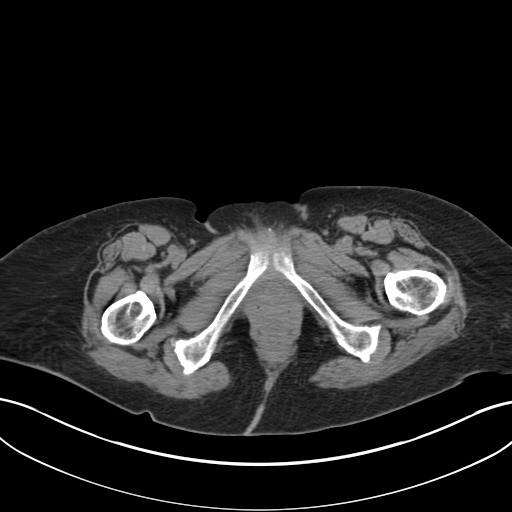
[im 5/89  bone]
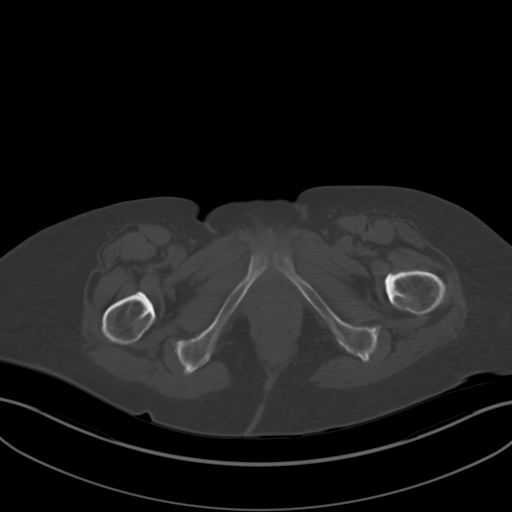
[im 10/89  soft-tissue]
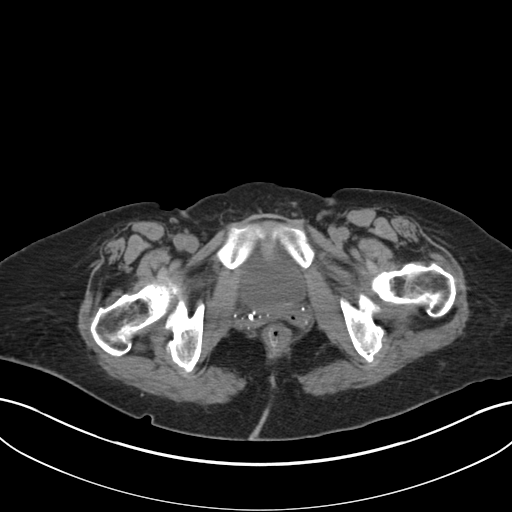
[im 20/89  soft-tissue]
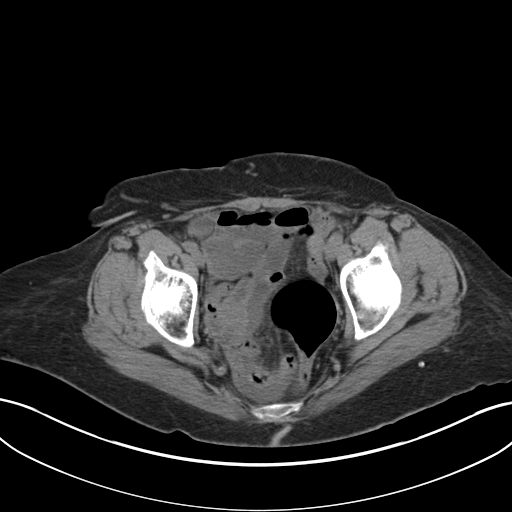
[im 25/89  soft-tissue]
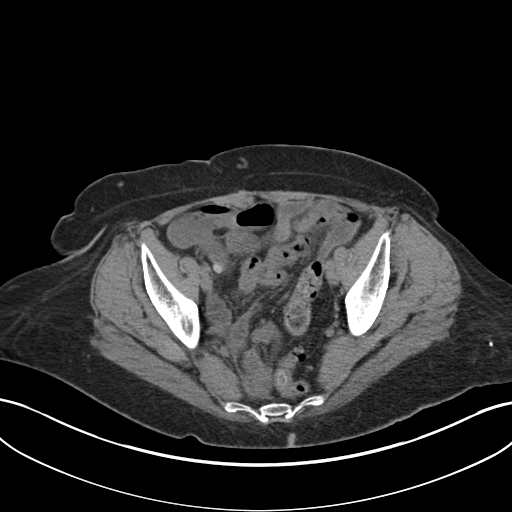
[im 30/89  soft-tissue]
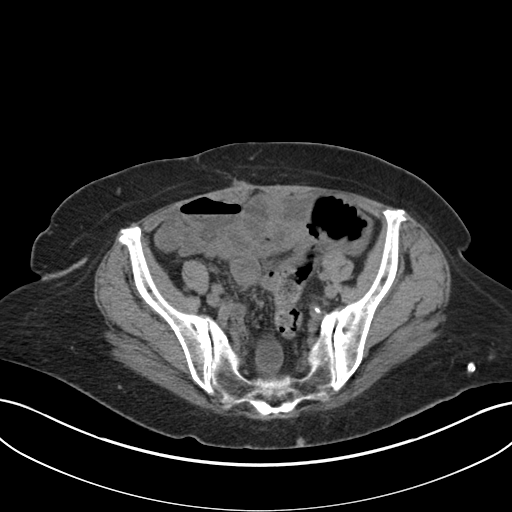
[im 40/89  soft-tissue]
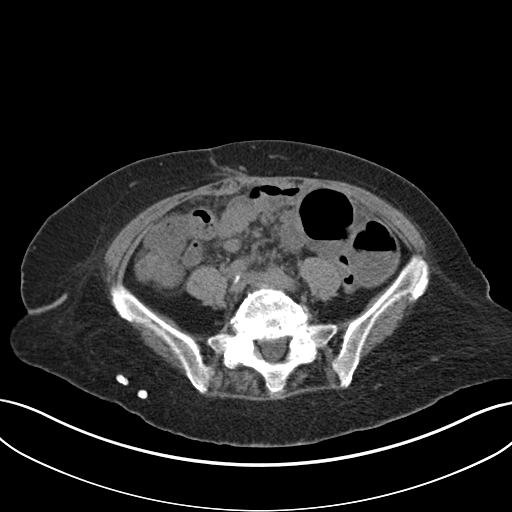
[im 45/89  soft-tissue]
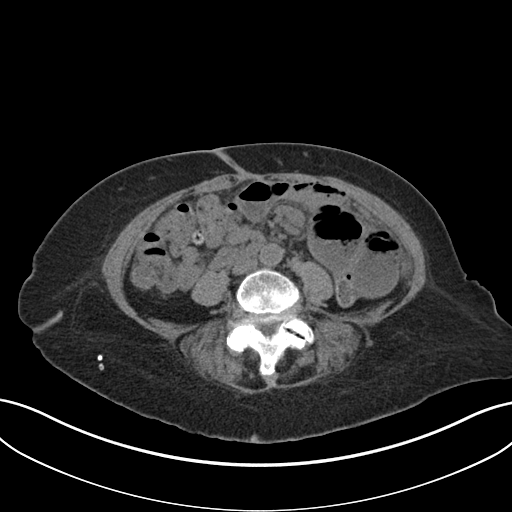
[im 49/89  soft-tissue]
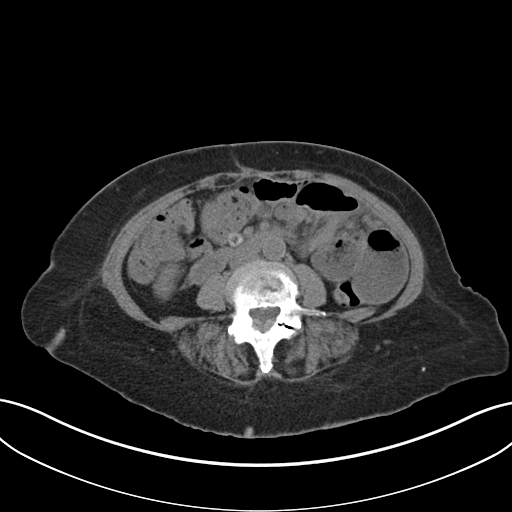
[im 59/89  soft-tissue]
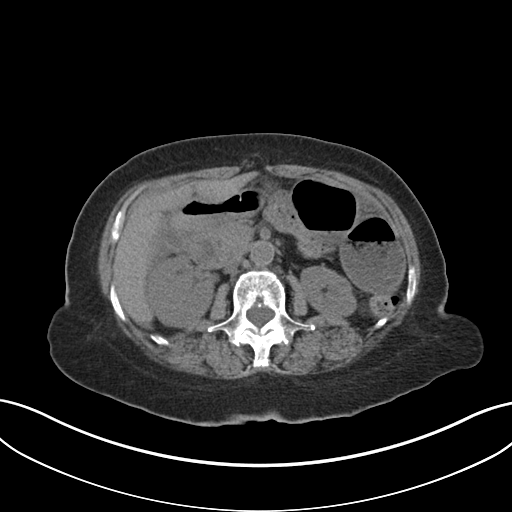
[im 59/89  bone]
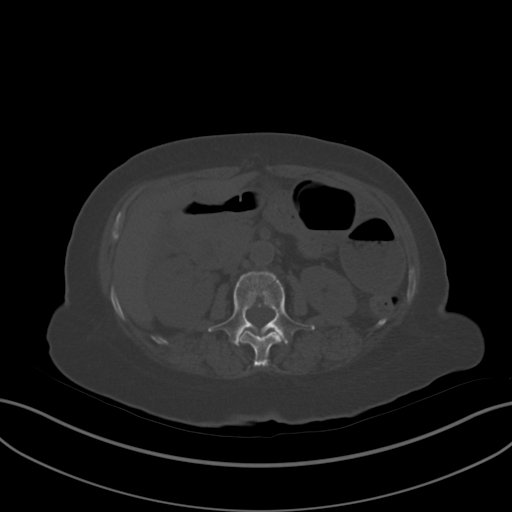
[im 64/89  soft-tissue]
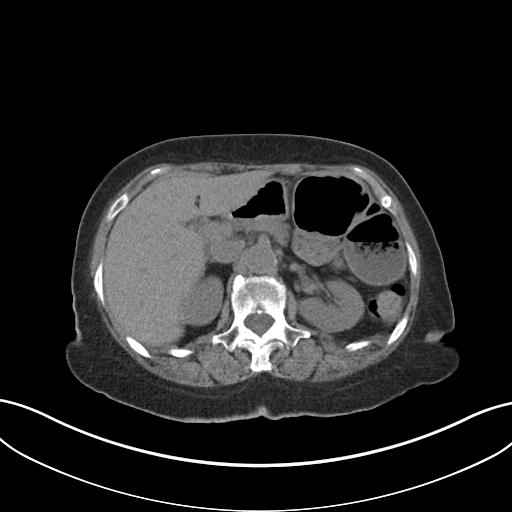
[im 69/89  soft-tissue]
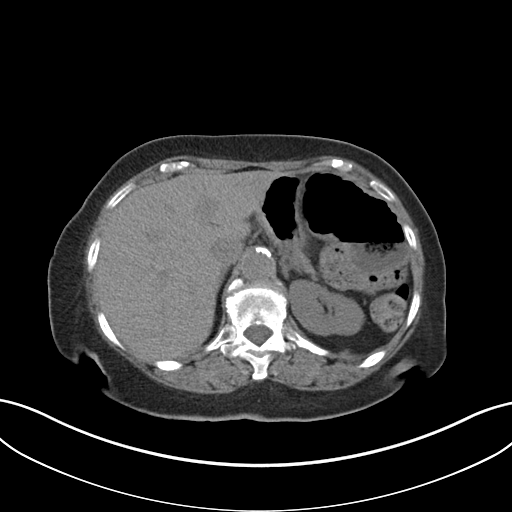
[im 79/89  soft-tissue]
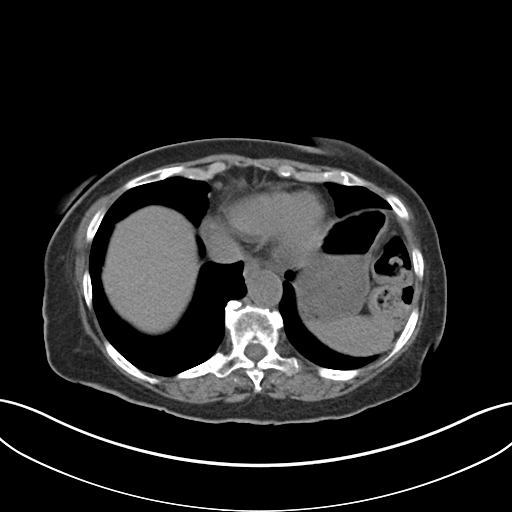
[im 84/89  soft-tissue]
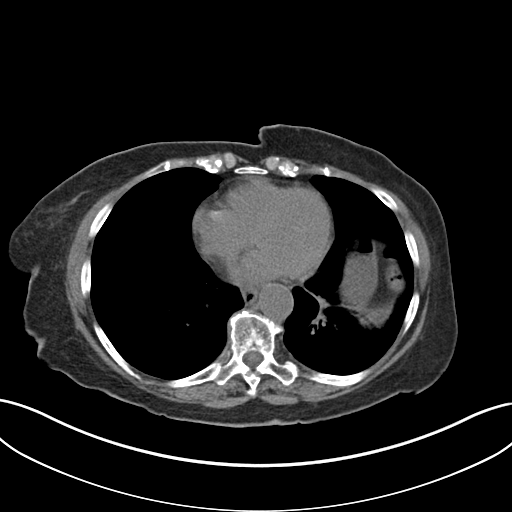

[Series 5: coronal st · coronal · 0.89mm/px · 3 of 121 slices shown]
[im 41/121  soft-tissue]
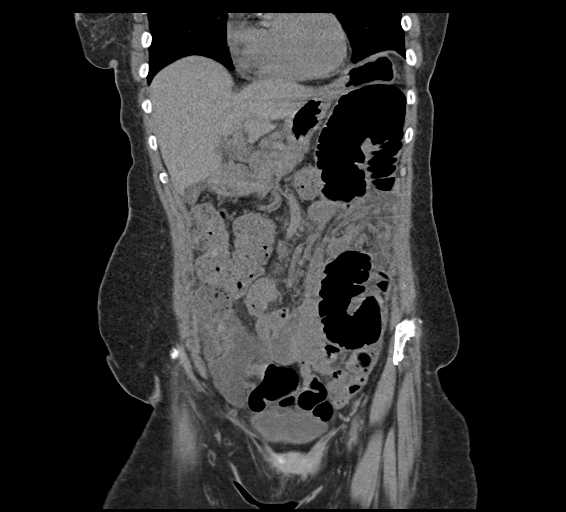
[im 54/121  soft-tissue]
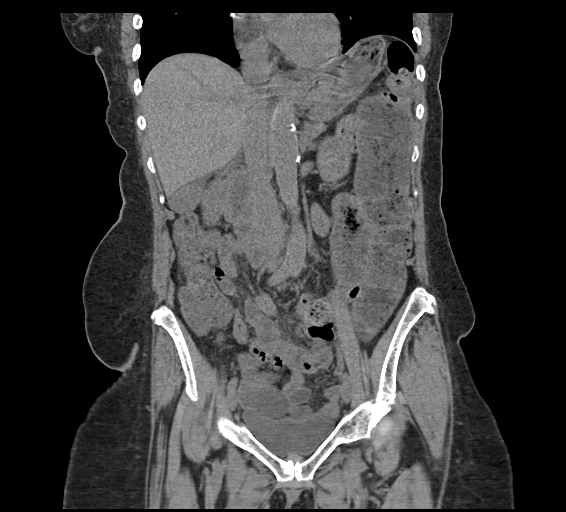
[im 67/121  soft-tissue]
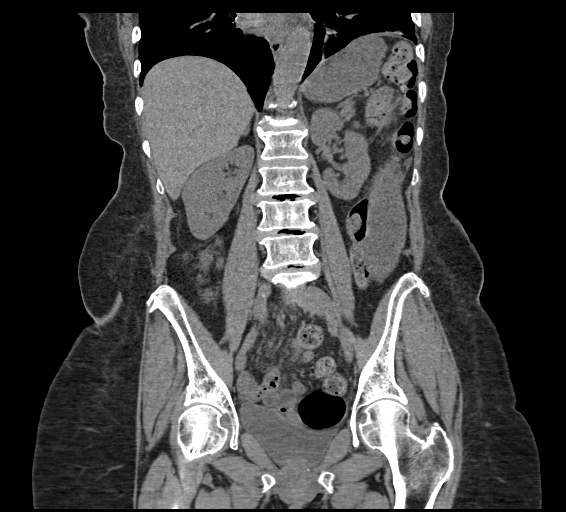

[16 of 46 positions shown; findings below may reference images not displayed]

FINDINGS: Lower chest: Interval further decrease in the spiculated mass in the
left lower lobe now measuring 1.4 x 1.6 cm. Atelectasis versus
scarring in the left lower lobe.

Hepatobiliary: Normal appearance of the liver. Tiny 2 mm gallstone
in the dependent portion of the gallbladder.

Pancreas: Atrophic pancreas.

Spleen: Normal in size without focal abnormality.

Adrenals/Urinary Tract: Adrenal glands are unremarkable. Kidneys are
normal, without renal calculi, focal lesion, or hydronephrosis.
Bladder is unremarkable.

Stomach/Bowel: Stomach is within normal limits. No evidence of
appendicitis. No evidence of bowel wall thickening, distention, or
inflammatory changes.

Vascular/Lymphatic: Aortic atherosclerosis. No enlarged abdominal or
pelvic lymph nodes.

Reproductive: Status post hysterectomy. No adnexal masses.

Other: No abdominal wall hernia or abnormality. No abdominopelvic
ascites.

Musculoskeletal: Spondylosis of the lumbosacral spine.
IMPRESSION: 1. No evidence of acute abnormalities within the abdomen or pelvis.
2. Interval further decrease in the spiculated mass in the left
lower lobe upper lung now measuring 1.4 x 1.6 cm.
3. Cholelithiasis without evidence of acute cholecystitis.
4. Aortic atherosclerosis.

Aortic Atherosclerosis ([ZS]-[ZS]).

## 2020-10-16 MED ORDER — MORPHINE SULFATE (PF) 4 MG/ML IV SOLN
4.0000 mg | Freq: Once | INTRAVENOUS | Status: AC
  Administered 2020-10-16: 4 mg via INTRAVENOUS
  Filled 2020-10-16: qty 1

## 2020-10-16 MED ORDER — ONDANSETRON HCL 4 MG/2ML IJ SOLN
4.0000 mg | Freq: Once | INTRAMUSCULAR | Status: AC
  Administered 2020-10-16: 4 mg via INTRAVENOUS
  Filled 2020-10-16: qty 2

## 2020-10-16 MED ORDER — SODIUM CHLORIDE 0.9 % IV SOLN
1000.0000 mL | INTRAVENOUS | Status: DC
  Administered 2020-10-16: 1000 mL via INTRAVENOUS

## 2020-10-16 MED ORDER — SODIUM CHLORIDE 0.9 % IV BOLUS (SEPSIS)
500.0000 mL | Freq: Once | INTRAVENOUS | Status: AC
  Administered 2020-10-16: 500 mL via INTRAVENOUS

## 2020-10-16 NOTE — Assessment & Plan Note (Signed)
BP Readings from Last 3 Encounters:  10/13/20 128/76  10/11/20 (!) 122/102  10/05/20 131/72   Stable, pt to continue medical treatment norvasc, losartan

## 2020-10-16 NOTE — ED Notes (Addendum)
This Probation officer went and got the patient from the lobby. Patient's daughter told writer she was going back with the patient. Writer asked the daughter if her mother could answer questions and the daughter replied that she could. Patient's daughter  Followed the writer into the triage room and stated that she did not want her mother left in the room with me. Patient's daughter asked the triage tech, Hailey to watch the writer with her mother. Patient's daughter then said, "I better step out of this room before I get arrested." Writer then told the daughter that she was not going to hurt me."  At the end of triage, the patient's daughter asking for the writer's last name and informed the patient's daughter that we did not give the last name. Patient's daughter given the Charge nurse's name upon her request. Charge nurse notified.

## 2020-10-16 NOTE — ED Triage Notes (Signed)
Patient c/o mid abdominal pain and vomiting since 1200 today. Patient denies any diarrhea.

## 2020-10-16 NOTE — ED Notes (Signed)
MD in room at this time.

## 2020-10-16 NOTE — ED Provider Notes (Signed)
[name] Provider Note   CSN: [id number] Arrival date & time: 10/16/20  1824     History Chief Complaint  Patient presents with  . Abdominal Pain  . Emesis    Angelica [name] is a 80 y.o. female.  HPI   Patient presents to the ED for evaluation of abdominal pain and vomiting.  Patient has a history of lung cancer as well as prior bowel obstructions.  Her last chemotherapy infusion was May 18.  Patient this afternoon started having pain in her upper abdomen.  She tried taking some Mylanta without relief.  She then tried to eat something but began having nausea and vomiting.  This has persisted.  Patient did have Avril bowel movements this morning.  She continues to have pain in the upper abdomen on the left side.  No fevers.  No dysuria.  Past Medical History:  Diagnosis Date  . Arthritis   . Asthma   . Bowel obstruction ([place])   . Cancer ([place])   . Environmental allergies   . GERD (gastroesophageal reflux disease)   . Glaucoma 05/09/2017  . H. pylori infection   . History of radiation therapy 07/05/20-08/05/20   Left lung IMRT Dr. Sondra Come   . HLD (hyperlipidemia) 01/16/2019  . Hypertension   . Iron deficiency anemia   . Osteopenia 05/26/2017  . Thyroid disease   . Urticaria 07/25/2018    Patient Active Problem List   Diagnosis Date Noted  . Encounter for antineoplastic immunotherapy 08/31/2020  . Adenocarcinoma of left lung, stage 3 ([place]) 06/22/2020  . Encounter for antineoplastic chemotherapy 06/22/2020  . Lung mass   . Adenopathy 06/09/2020  . Epigastric abdominal tenderness with rebound tenderness 05/17/2020  . Pulmonary nodule 05/17/2020  . Iron deficiency 05/03/2020  . Dizziness 01/19/2020  . Left rotator cuff tear 11/12/2019  . Trigger thumb of right hand 11/12/2019  . Trigger finger, left ring finger 11/12/2019  . Left shoulder pain 09/29/2019  . Swelling of lower extremity 07/30/2019  . Greater trochanteric bursitis of  left hip 04/07/2019  . Right ankle swelling 02/18/2019  . Ganglion cyst of right foot 01/16/2019  . HLD (hyperlipidemia) 01/16/2019  . 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 Urticaria 07/25/2018  . Low back pain 07/09/2018  . Hyperglycemia 07/09/2018  . Rash 10/10/2017  . Constipation 08/19/2017  . Hypokalemia 07/09/2017  . Asthma, severe persistent, well-controlled 06/04/2017  . 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 Nasal polyposis 06/04/2017  . Leukocytosis 06/04/2017  . Osteopenia 05/26/2017  . Encounter for well adult exam with abnormal findings 05/09/2017  . Glaucoma 05/09/2017  . GERD (gastroesophageal reflux disease)   . H. pylori infection   . Other allergic rhinitis 03/20/2016  . Cough, persistent 03/20/2016  . Hypoglycemia 04/17/2015  . Small bowel obstruction ([place])   . Malnutrition of moderate degree 04/11/2015  . SBO (small bowel obstruction) ([place]) 04/10/2015  . HTN (hypertension) 04/10/2015    Past Surgical History:  Procedure Laterality Date  . ABDOMINAL HYSTERECTOMY    . BOWEL RESECTION N/A 05/23/2020   Procedure: SMALL BOWEL REPAIR;  Surgeon: Kinsinger, Arta Bruce, MD;  Location: [facility];  Service: General;  Laterality: N/A;  . BREAST BIOPSY    . BRONCHIAL BIOPSY  06/10/2020   Procedure: BRONCHIAL BIOPSIES;  Surgeon: Garner Nash, DO;  Location: [facility];  Service: Pulmonary;;  . BRONCHIAL BRUSHINGS  06/10/2020   Procedure: BRONCHIAL BRUSHINGS;  Surgeon: Garner Nash, DO;  Location: [facility];  Service: Pulmonary;;  .  BRONCHIAL NEEDLE ASPIRATION BIOPSY  06/10/2020   Procedure: BRONCHIAL NEEDLE ASPIRATION BIOPSIES;  Surgeon: Garner Nash, DO;  Location: [facility];  Service: Pulmonary;;  . BRONCHIAL WASHINGS  06/10/2020   Procedure: BRONCHIAL WASHINGS;  Surgeon: Garner Nash, DO;  Location: [facility];  Service: Pulmonary;;  . COLON SURGERY    . DIAGNOSTIC LARYNGOSCOPY N/A 05/23/2020   Procedure: ATTEMPTED DIAGNOSTIC LAPAROSCOPY  WITH ADHESIONS;  Surgeon: Kieth Brightly, Arta Bruce, MD;  Location: [facility];  Service: General;  Laterality: N/A;  . IR IMAGING GUIDED PORT INSERTION  07/15/2020  . KNEE SURGERY    . LAPAROTOMY N/A 04/18/2015   Procedure: Exploratory laparotomy with lysis of adhesions, possible bowel resection;  Surgeon: Ralene Ok, MD;  Location: [facility];  Service: General;  Laterality: N/A;  . LAPAROTOMY N/A 05/23/2020   Procedure: EXPLORATORY LAPAROTOMY;  Surgeon: Kieth Brightly Arta Bruce, MD;  Location: [facility];  Service: General;  Laterality: N/A;  . LYSIS OF ADHESION N/A 05/23/2020   Procedure: LYSIS OF ADHESION;  Surgeon: Kieth Brightly Arta Bruce, MD;  Location: [facility];  Service: General;  Laterality: N/A;  . NASAL SINUS SURGERY    . THYROID SURGERY    . VIDEO BRONCHOSCOPY WITH ENDOBRONCHIAL NAVIGATION Bilateral 06/10/2020   Procedure: VIDEO BRONCHOSCOPY WITH ENDOBRONCHIAL NAVIGATION;  Surgeon: Garner Nash, DO;  Location: [facility];  Service: Pulmonary;  Laterality: Bilateral;  . VIDEO BRONCHOSCOPY WITH ENDOBRONCHIAL ULTRASOUND  06/10/2020   Procedure: VIDEO BRONCHOSCOPY WITH ENDOBRONCHIAL ULTRASOUND;  Surgeon: Garner Nash, DO;  Location: [facility];  Service: Pulmonary;;     OB History   No obstetric history on file.     Family History  Problem Relation Age of Onset  . Diabetes Mother   . Hypertension Mother   . Hypertension Father   . Glaucoma Sister   . Glaucoma Brother   . Allergic rhinitis Neg Hx   . Angioedema Neg Hx   . Asthma Neg Hx   . Eczema Neg Hx   . Immunodeficiency Neg Hx   . Urticaria Neg Hx     Social History   Tobacco Use  . Smoking status: Former Smoker    Types: Cigarettes  . Smokeless tobacco: Never Used  . Tobacco comment: quit 60 years ago  Vaping Use  . Vaping Use: Never used  Substance Use Topics  . Alcohol use: No  . Drug use: No    Home Medications Prior to Admission medications   Medication Sig Start Date End Date Taking? Authorizing Provider   acetaminophen (TYLENOL) 325 MG tablet Take 2 tablets (650 mg total) by mouth every 6 (six) hours as needed for headache. Patient taking differently: Take 325 mg by mouth every 6 (six) hours as needed for headache or mild pain. 06/03/20   Saverio Danker, PA-C  amLODipine (NORVASC) 10 MG tablet Take 1 tablet by mouth once daily 08/08/20   [name], MD  Benralizumab [facility] PEN) 30 MG/ML SOAJ Inject into the skin every 8 (eight) weeks.    [provider]  budesonide-formoterol (SYMBICORT) 80-4.5 MCG/ACT inhaler Inhale 2 puffs into the lungs 2 (two) times daily. Use for asthma flares 2 puffs twice a day for 2 weeks or until cough and wheeze free Patient taking differently: Inhale 2 puffs into the lungs 2 (two) times daily as needed (for asthma flares, for 2 weeks, or until cough and wheeze-free). 01/28/20   Kennith Gain, MD  Cholecalciferol (VITAMIN D3) 50 MCG (2000 UT) TABS Take 2,000 Units by mouth daily.  [provider]  EPINEPHrine (EPIPEN 2-PAK) 0.3 mg/0.3 mL IJ SOAJ injection Use as directed for severe allergic reaction Patient taking differently: Inject 0.3 mg into the muscle as needed for anaphylaxis. 01/28/20   Kennith Gain, MD  ipratropium (ATROVENT) 0.06 % nasal spray Place 2 sprays into both nostrils 3 (three) times daily. 07/28/20   Kennith Gain, MD  linaclotide (LINZESS) 72 MCG capsule TAKE 1 CAPSULE BY MOUTH ONCE DAILY AS NEEDED Patient taking differently: Take 72 mcg by mouth daily as needed (constipation). 06/14/20   [name], MD  loratadine (CLARITIN) 10 MG tablet Take 10 mg by mouth daily.    [provider]  losartan (COZAAR) 100 MG tablet Take 100 mg by mouth daily.    [provider]  Magnesium Hydroxide (PHILLIPS MILK OF MAGNESIA PO) Take 15-30 mLs by mouth daily as needed (for constipation).    [provider]  montelukast (SINGULAIR) 10 MG tablet Take 1 tablet (10 mg total) by mouth at  bedtime. Patient taking differently: Take 10 mg by mouth as needed. 08/15/20   Kennith Gain, MD  pravastatin (PRAVACHOL) 80 MG tablet Take 1 tablet (80 mg total) by mouth daily. 06/14/20   [name], MD  prochlorperazine (COMPAZINE) 10 MG tablet Take 1 tablet (10 mg total) by mouth every 6 (six) hours as needed for nausea or vomiting. 06/22/20   Curt Bears, MD  telmisartan (MICARDIS) 80 MG tablet Take 1 tablet (80 mg total) by mouth daily. 07/14/20   [name], MD  traMADol (ULTRAM) 50 MG tablet Take 1 tablet (50 mg total) by mouth every 6 (six) hours as needed for moderate pain. 08/01/20   [name], MD  vitamin B-12 (CYANOCOBALAMIN) 1000 MCG tablet Take 1,000 mcg by mouth daily.    [provider]    Allergies    Asa buff (mag [buffered aspirin], Ensure [nutritional supplements], Fish allergy, Milk-related compounds, Other, Peanut-containing drug products, Penicillins, Shellfish-derived products, and Strawberry extract  Review of Systems   Review of Systems  All other systems reviewed and are negative.   Physical Exam Updated Vital Signs BP (!) 110/45   Pulse 81   Temp 97.8 F (36.6 C) (Oral)   Resp (!) 23   Ht 1.651 m (5\' 5" )   Wt 68 kg   SpO2 100%   BMI 24.96 kg/m   Physical Exam Vitals and nursing note reviewed.  Constitutional:      Appearance: She is ill-appearing.  HENT:     Head: Normocephalic and atraumatic.     Right Ear: External ear normal.     Left Ear: External ear normal.  Eyes:     General: No scleral icterus.       Right eye: No discharge.        Left eye: No discharge.     Conjunctiva/sclera: Conjunctivae normal.  Neck:     Trachea: No tracheal deviation.  Cardiovascular:     Rate and Rhythm: Normal rate and regular rhythm.  Pulmonary:     Effort: Pulmonary effort is normal. No respiratory distress.     Breath sounds: Normal breath sounds. No stridor. No wheezing or rales.  Abdominal:     General: Bowel sounds are  decreased. There is no distension.     Palpations: Abdomen is soft.     Tenderness: There is abdominal tenderness in the left lower quadrant. There is no guarding or rebound.  Musculoskeletal:  General: No tenderness.     Cervical back: Neck supple.  Skin:    General: Skin is warm and dry.     Findings: No rash.  Neurological:     Mental Status: She is alert.     Cranial Nerves: No cranial nerve deficit (no facial droop, extraocular movements intact, no slurred speech).     Sensory: No sensory deficit.     Motor: No abnormal muscle tone or seizure activity.     Coordination: Coordination normal.     ED Results / Procedures / Treatments   Labs (all labs ordered are listed, but only abnormal results are displayed) Labs Reviewed  COMPREHENSIVE METABOLIC PANEL - Abnormal; Notable for the following components:      Result Value   Sodium 134 (*)    Glucose, Bld 122 (*)    All other components within normal limits  CBC - Abnormal; Notable for the following components:   RBC 3.77 (*)    Hemoglobin 11.5 (*)    HCT 34.9 (*)    All other components within normal limits  URINALYSIS, ROUTINE W REFLEX MICROSCOPIC - Abnormal; Notable for the following components:   Color, Urine STRAW (*)    All other components within normal limits  LIPASE, BLOOD    EKG None  Radiology CT ABDOMEN PELVIS WO CONTRAST  Result Date: 10/16/2020 CLINICAL DATA:  Mid abdominal pain and vomiting. History of non-small lung cancer. EXAM: CT ABDOMEN AND PELVIS WITHOUT CONTRAST TECHNIQUE: Multidetector CT imaging of the abdomen and pelvis was performed following the standard protocol without IV contrast. COMPARISON:  May 17, 2020 FINDINGS: Lower chest: Interval further decrease in the spiculated mass in the left lower lobe now measuring 1.4 x 1.6 cm. Atelectasis versus scarring in the left lower lobe. Hepatobiliary: Normal appearance of the liver. Tiny 2 mm gallstone in the dependent portion of the  gallbladder. Pancreas: Atrophic pancreas. Spleen: Normal in size without focal abnormality. Adrenals/Urinary Tract: Adrenal glands are unremarkable. Kidneys are normal, without renal calculi, focal lesion, or hydronephrosis. Bladder is unremarkable. Stomach/Bowel: Stomach is within normal limits. No evidence of appendicitis. No evidence of bowel wall thickening, distention, or inflammatory changes. Vascular/Lymphatic: Aortic atherosclerosis. No enlarged abdominal or pelvic lymph nodes. Reproductive: Status post hysterectomy. No adnexal masses. Other: No abdominal wall hernia or abnormality. No abdominopelvic ascites. Musculoskeletal: Spondylosis of the lumbosacral spine. IMPRESSION: 1. No evidence of acute abnormalities within the abdomen or pelvis. 2. Interval further decrease in the spiculated mass in the left lower lobe upper lung now measuring 1.4 x 1.6 cm. 3. Cholelithiasis without evidence of acute cholecystitis. 4. Aortic atherosclerosis. Aortic Atherosclerosis (ICD10-I70.0). Electronically Signed   By: Fidela Salisbury M.D.   On: 10/16/2020 19:43    Procedures Procedures   Medications Ordered in ED Medications  sodium chloride 0.9 % bolus 500 mL (0 mLs Intravenous Stopped 10/16/20 2155)    Followed by  0.9 %  sodium chloride infusion (1,000 mLs Intravenous New Bag/Given 10/16/20 1953)  morphine 4 MG/ML injection 4 mg (4 mg Intravenous Given 10/16/20 1954)  ondansetron (ZOFRAN) injection 4 mg (4 mg Intravenous Given 10/16/20 1953)    ED Course  I have reviewed the triage vital signs and the nursing notes.  Pertinent labs & imaging results that were available during my care of the patient were reviewed by me and considered in my medical decision making (see chart for details).  Clinical Course as of 10/16/20 2248  Sun Oct 16, 2020  2047 CT scan without signs  of obstruction or other acute abnormality [JK]  7517 CBC and metabolic panel unremarkable [JK]  2215 Analysis unremarkable [JK]   2248 Patient is feeling much better after treatment.  No recurrent episodes of nausea and vomiting. [JK]    Clinical Course User Index [JK] Dorie Rank, MD   MDM Rules/Calculators/A&P                          Patient presented to the ED for evaluation of nausea vomiting and abdominal pain.  Patient does have history of lung cancer and is receiving chemotherapy.  Patient also has had history of prior bowel obstructions.  She did have tenderness in upper abdomen.  Her laboratory tests and CT scans were performed.  No signs of pancreatitis hepatitis or urinary tract infection.  No signs of severe dehydration.  CT scan does not show any acute abnormalities and no signs of obstruction.  At this point patient is feeling much better.  She has not had any further vomiting.  She feels comfortable going home.  Discussed outpatient follow-up with her oncologist and to return to the ED for any recurrent symptoms. Final Clinical Impression(s) / ED Diagnoses Final diagnoses:  Abdominal pain, unspecified abdominal location  Vomiting, intractability of vomiting not specified, presence of nausea not specified, unspecified vomiting type    Rx / DC Orders ED Discharge Orders    None       Dorie Rank, MD 10/16/20 2249

## 2020-10-16 NOTE — Assessment & Plan Note (Signed)
Stable overall, cont same tx - symbicort, atrovent, singulair

## 2020-10-16 NOTE — Assessment & Plan Note (Addendum)
To continue f/u with oncology with immunotherapy, ok for [facility] nurse in addition to the PT

## 2020-10-16 NOTE — Assessment & Plan Note (Signed)
Mild, no red flags, for mylanta or pepcid otc prn

## 2020-10-16 NOTE — ED Notes (Signed)
Pt aware of need for urine sample but unable to provide one at this time.

## 2020-10-16 NOTE — Assessment & Plan Note (Signed)
Improved, to cont PT

## 2020-10-16 NOTE — Assessment & Plan Note (Signed)
Age and sex appropriate education and counseling updated with regular exercise and diet Referrals for preventative services - none needed Immunizations addressed - declines all imunizations Smoking counseling  - none needed Evidence for depression or other mood disorder - none significant Most recent labs reviewed. I have personally reviewed and have noted: 1) the patient's medical and social history 2) The patient's current medications and supplements 3) The patient's height, weight, and BMI have been recorded in the chart

## 2020-10-16 NOTE — Assessment & Plan Note (Signed)
Recent due to low volume now resolved,  to f/u any worsening symptoms or concerns

## 2020-10-16 NOTE — Discharge Instructions (Addendum)
Continue to take Compazine as needed for nausea.  Follow-up with your oncology doctor to be rechecked if the symptoms persist.  Return to the ED for pain fever or other concerning symptoms

## 2020-10-17 NOTE — ED Notes (Addendum)
Right chest port flushed, needle de-accessed.

## 2020-10-19 ENCOUNTER — Other Ambulatory Visit: Payer: Self-pay

## 2020-10-19 ENCOUNTER — Inpatient Hospital Stay (HOSPITAL_COMMUNITY)
Admission: EM | Admit: 2020-10-19 | Discharge: 2020-10-22 | DRG: 641 | Disposition: A | Discharge status: Home / Self Care | Payer: Medicare HMO | Attending: Internal Medicine | Admitting: Internal Medicine

## 2020-10-19 ENCOUNTER — Emergency Department (HOSPITAL_COMMUNITY): Payer: Medicare HMO

## 2020-10-19 ENCOUNTER — Telehealth: Payer: Self-pay | Admitting: Internal Medicine

## 2020-10-19 DIAGNOSIS — Z91013 Allergy to seafood: Secondary | ICD-10-CM

## 2020-10-19 DIAGNOSIS — H409 Unspecified glaucoma: Secondary | ICD-10-CM | POA: Diagnosis present

## 2020-10-19 DIAGNOSIS — Z79899 Other long term (current) drug therapy: Secondary | ICD-10-CM

## 2020-10-19 DIAGNOSIS — R5381 Other malaise: Secondary | ICD-10-CM | POA: Diagnosis present

## 2020-10-19 DIAGNOSIS — C3492 Malignant neoplasm of unspecified part of left bronchus or lung: Secondary | ICD-10-CM | POA: Diagnosis not present

## 2020-10-19 DIAGNOSIS — T451X5A Adverse effect of antineoplastic and immunosuppressive drugs, initial encounter: Secondary | ICD-10-CM | POA: Diagnosis present

## 2020-10-19 DIAGNOSIS — M7552 Bursitis of left shoulder: Secondary | ICD-10-CM | POA: Diagnosis not present

## 2020-10-19 DIAGNOSIS — E860 Dehydration: Secondary | ICD-10-CM | POA: Diagnosis not present

## 2020-10-19 DIAGNOSIS — Z9181 History of falling: Secondary | ICD-10-CM | POA: Diagnosis not present

## 2020-10-19 DIAGNOSIS — K219 Gastro-esophageal reflux disease without esophagitis: Secondary | ICD-10-CM | POA: Diagnosis present

## 2020-10-19 DIAGNOSIS — R339 Retention of urine, unspecified: Secondary | ICD-10-CM | POA: Diagnosis present

## 2020-10-19 DIAGNOSIS — E876 Hypokalemia: Secondary | ICD-10-CM | POA: Diagnosis not present

## 2020-10-19 DIAGNOSIS — R531 Weakness: Secondary | ICD-10-CM | POA: Diagnosis not present

## 2020-10-19 DIAGNOSIS — R110 Nausea: Secondary | ICD-10-CM | POA: Diagnosis not present

## 2020-10-19 DIAGNOSIS — E861 Hypovolemia: Secondary | ICD-10-CM | POA: Diagnosis present

## 2020-10-19 DIAGNOSIS — Z880 Allergy status to penicillin: Secondary | ICD-10-CM

## 2020-10-19 DIAGNOSIS — R109 Unspecified abdominal pain: Secondary | ICD-10-CM | POA: Diagnosis not present

## 2020-10-19 DIAGNOSIS — C3490 Malignant neoplasm of unspecified part of unspecified bronchus or lung: Secondary | ICD-10-CM | POA: Diagnosis present

## 2020-10-19 DIAGNOSIS — M7592 Shoulder lesion, unspecified, left shoulder: Secondary | ICD-10-CM | POA: Diagnosis not present

## 2020-10-19 DIAGNOSIS — R1110 Vomiting, unspecified: Secondary | ICD-10-CM

## 2020-10-19 DIAGNOSIS — M19012 Primary osteoarthritis, left shoulder: Secondary | ICD-10-CM | POA: Diagnosis not present

## 2020-10-19 DIAGNOSIS — Z87891 Personal history of nicotine dependence: Secondary | ICD-10-CM

## 2020-10-19 DIAGNOSIS — Z6824 Body mass index (BMI) 24.0-24.9, adult: Secondary | ICD-10-CM

## 2020-10-19 DIAGNOSIS — M170 Bilateral primary osteoarthritis of knee: Secondary | ICD-10-CM | POA: Diagnosis not present

## 2020-10-19 DIAGNOSIS — R1111 Vomiting without nausea: Secondary | ICD-10-CM | POA: Diagnosis not present

## 2020-10-19 DIAGNOSIS — E871 Hypo-osmolality and hyponatremia: Secondary | ICD-10-CM | POA: Diagnosis present

## 2020-10-19 DIAGNOSIS — Z923 Personal history of irradiation: Secondary | ICD-10-CM

## 2020-10-19 DIAGNOSIS — R634 Abnormal weight loss: Secondary | ICD-10-CM | POA: Diagnosis present

## 2020-10-19 DIAGNOSIS — R112 Nausea with vomiting, unspecified: Secondary | ICD-10-CM | POA: Diagnosis present

## 2020-10-19 DIAGNOSIS — J9811 Atelectasis: Secondary | ICD-10-CM | POA: Diagnosis not present

## 2020-10-19 DIAGNOSIS — J45909 Unspecified asthma, uncomplicated: Secondary | ICD-10-CM | POA: Diagnosis present

## 2020-10-19 DIAGNOSIS — I493 Ventricular premature depolarization: Secondary | ICD-10-CM | POA: Diagnosis present

## 2020-10-19 DIAGNOSIS — Z9221 Personal history of antineoplastic chemotherapy: Secondary | ICD-10-CM

## 2020-10-19 DIAGNOSIS — Z91018 Allergy to other foods: Secondary | ICD-10-CM

## 2020-10-19 DIAGNOSIS — Z83511 Family history of glaucoma: Secondary | ICD-10-CM

## 2020-10-19 DIAGNOSIS — I10 Essential (primary) hypertension: Secondary | ICD-10-CM | POA: Diagnosis present

## 2020-10-19 DIAGNOSIS — Z886 Allergy status to analgesic agent status: Secondary | ICD-10-CM

## 2020-10-19 DIAGNOSIS — Z20822 Contact with and (suspected) exposure to covid-19: Secondary | ICD-10-CM | POA: Diagnosis present

## 2020-10-19 DIAGNOSIS — Z7951 Long term (current) use of inhaled steroids: Secondary | ICD-10-CM | POA: Diagnosis not present

## 2020-10-19 DIAGNOSIS — Z79891 Long term (current) use of opiate analgesic: Secondary | ICD-10-CM | POA: Diagnosis not present

## 2020-10-19 DIAGNOSIS — R1084 Generalized abdominal pain: Secondary | ICD-10-CM | POA: Diagnosis not present

## 2020-10-19 DIAGNOSIS — R000 Tachycardia, unspecified: Secondary | ICD-10-CM | POA: Diagnosis not present

## 2020-10-19 DIAGNOSIS — G8929 Other chronic pain: Secondary | ICD-10-CM | POA: Diagnosis present

## 2020-10-19 DIAGNOSIS — E785 Hyperlipidemia, unspecified: Secondary | ICD-10-CM | POA: Diagnosis present

## 2020-10-19 DIAGNOSIS — J4550 Severe persistent asthma, uncomplicated: Secondary | ICD-10-CM

## 2020-10-19 DIAGNOSIS — I491 Atrial premature depolarization: Secondary | ICD-10-CM | POA: Diagnosis not present

## 2020-10-19 DIAGNOSIS — Z91010 Allergy to peanuts: Secondary | ICD-10-CM

## 2020-10-19 DIAGNOSIS — R21 Rash and other nonspecific skin eruption: Secondary | ICD-10-CM | POA: Diagnosis not present

## 2020-10-19 DIAGNOSIS — Z888 Allergy status to other drugs, medicaments and biological substances status: Secondary | ICD-10-CM

## 2020-10-19 DIAGNOSIS — Z8249 Family history of ischemic heart disease and other diseases of the circulatory system: Secondary | ICD-10-CM

## 2020-10-19 LAB — TROPONIN I (HIGH SENSITIVITY)
Troponin I (High Sensitivity): 9 ng/L (ref <18)
Troponin I (High Sensitivity): 8 ng/L (ref <18)

## 2020-10-19 LAB — COMPREHENSIVE METABOLIC PANEL
ALT: 12 U/L (ref 0–44)
AST: 15 U/L (ref 15–41)
Albumin: 3.5 g/dL (ref 3.5–5.0)
Alkaline Phosphatase: 64 U/L (ref 38–126)
Anion gap: 12 (ref 5–15)
BUN: 13 mg/dL (ref 8–23)
CO2: 19 mmol/L — ABNORMAL LOW (ref 22–32)
Calcium: 9.4 mg/dL (ref 8.9–10.3)
Chloride: 99 mmol/L (ref 98–111)
Creatinine, Ser: 0.92 mg/dL (ref 0.44–1.00)
GFR, Estimated: >60 mL/min (ref >60)
Glucose, Bld: 117 mg/dL — ABNORMAL HIGH (ref 70–99)
Potassium: 3.6 mmol/L (ref 3.5–5.1)
Sodium: 130 mmol/L — ABNORMAL LOW (ref 135–145)
Total Bilirubin: 0.4 mg/dL (ref 0.3–1.2)
Total Protein: 7.1 g/dL (ref 6.5–8.1)

## 2020-10-19 LAB — CBC WITH DIFFERENTIAL/PLATELET
Abs Immature Granulocytes: 0.03 10*3/uL (ref 0.00–0.07)
Basophils Absolute: 0 10*3/uL (ref 0.0–0.1)
Basophils Relative: 0 %
Eosinophils Absolute: 0 10*3/uL (ref 0.0–0.5)
Eosinophils Relative: 0 %
HCT: 39.5 % (ref 36.0–46.0)
Hemoglobin: 13.4 g/dL (ref 12.0–15.0)
Immature Granulocytes: 0 %
Lymphocytes Relative: 13 %
Lymphs Abs: 1 10*3/uL (ref 0.7–4.0)
MCH: 31 pg (ref 26.0–34.0)
MCHC: 33.9 g/dL (ref 30.0–36.0)
MCV: 91.4 fL (ref 80.0–100.0)
Monocytes Absolute: 0.8 10*3/uL (ref 0.1–1.0)
Monocytes Relative: 10 %
Neutro Abs: 5.9 10*3/uL (ref 1.7–7.7)
Neutrophils Relative %: 77 %
Platelets: 261 10*3/uL (ref 150–400)
RBC: 4.32 MIL/uL (ref 3.87–5.11)
RDW: 13.2 % (ref 11.5–15.5)
WBC: 7.7 10*3/uL (ref 4.0–10.5)
nRBC: 0 % (ref 0.0–0.2)

## 2020-10-19 LAB — LIPASE, BLOOD: Lipase: 22 U/L (ref 11–51)

## 2020-10-19 IMAGING — DX DG CHEST 1V PORT
1 series · 1 of 1 positions shown · non-contrast
Comparison: Chest radiograph dated [DATE].

CLINICAL DATA: 79-year-old female with weakness.

EXAM:
PORTABLE CHEST 1 VIEW

[chest ap]
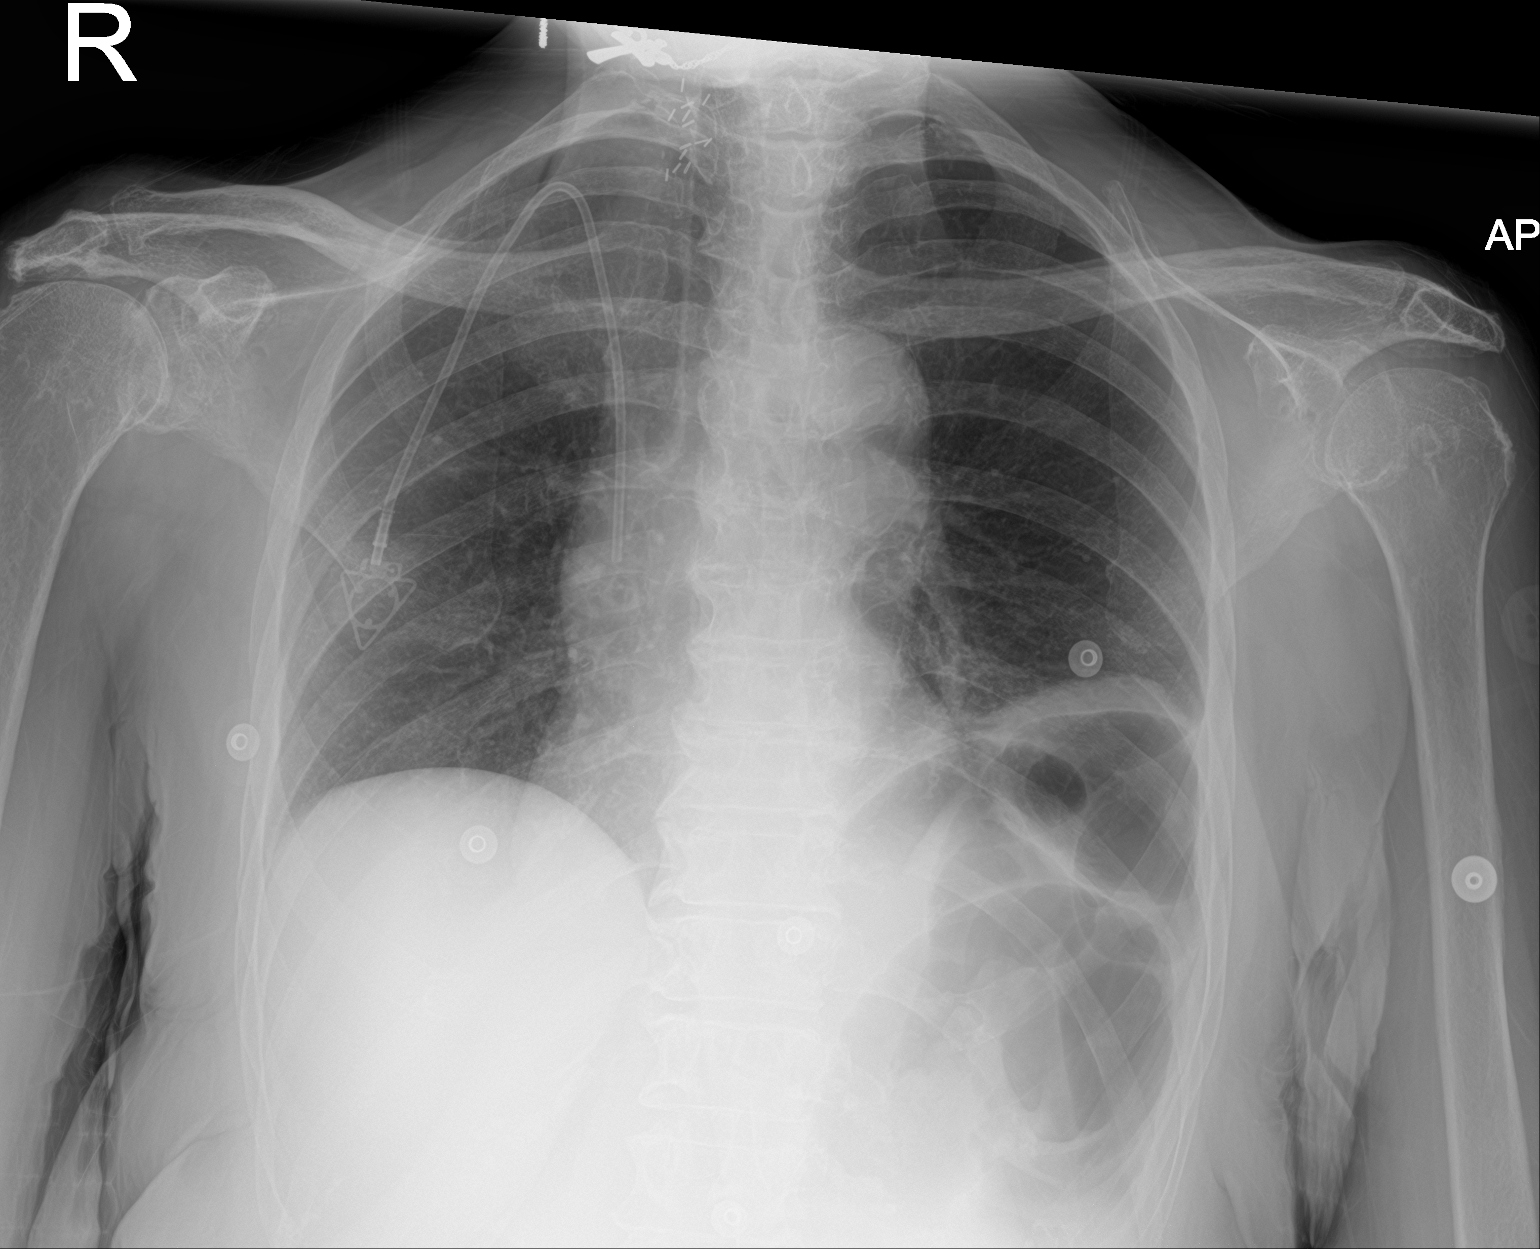

[1 of 1 positions shown; findings below may reference images not displayed]

FINDINGS: Right-sided Port-A-Cath tip over central SVC in similar position.
Minimal left lung base atelectasis. No consolidative changes. There
is no pleural effusion pneumothorax. The cardiac silhouette is
within limits. No acute osseous pathology. Thyroidectomy surgical
clips.
IMPRESSION: No active cardiopulmonary disease.

## 2020-10-19 IMAGING — DX DG ABDOMEN 2V
1 series · 3 of 3 positions shown · non-contrast
Comparison: [DATE] CT

CLINICAL DATA: Abdominal pain and hypotension

EXAM:
ABDOMEN - 2 VIEW

[Series 1: abdomen · 0.14mm/px · 3 of 3 slices shown]
[im 1/3]
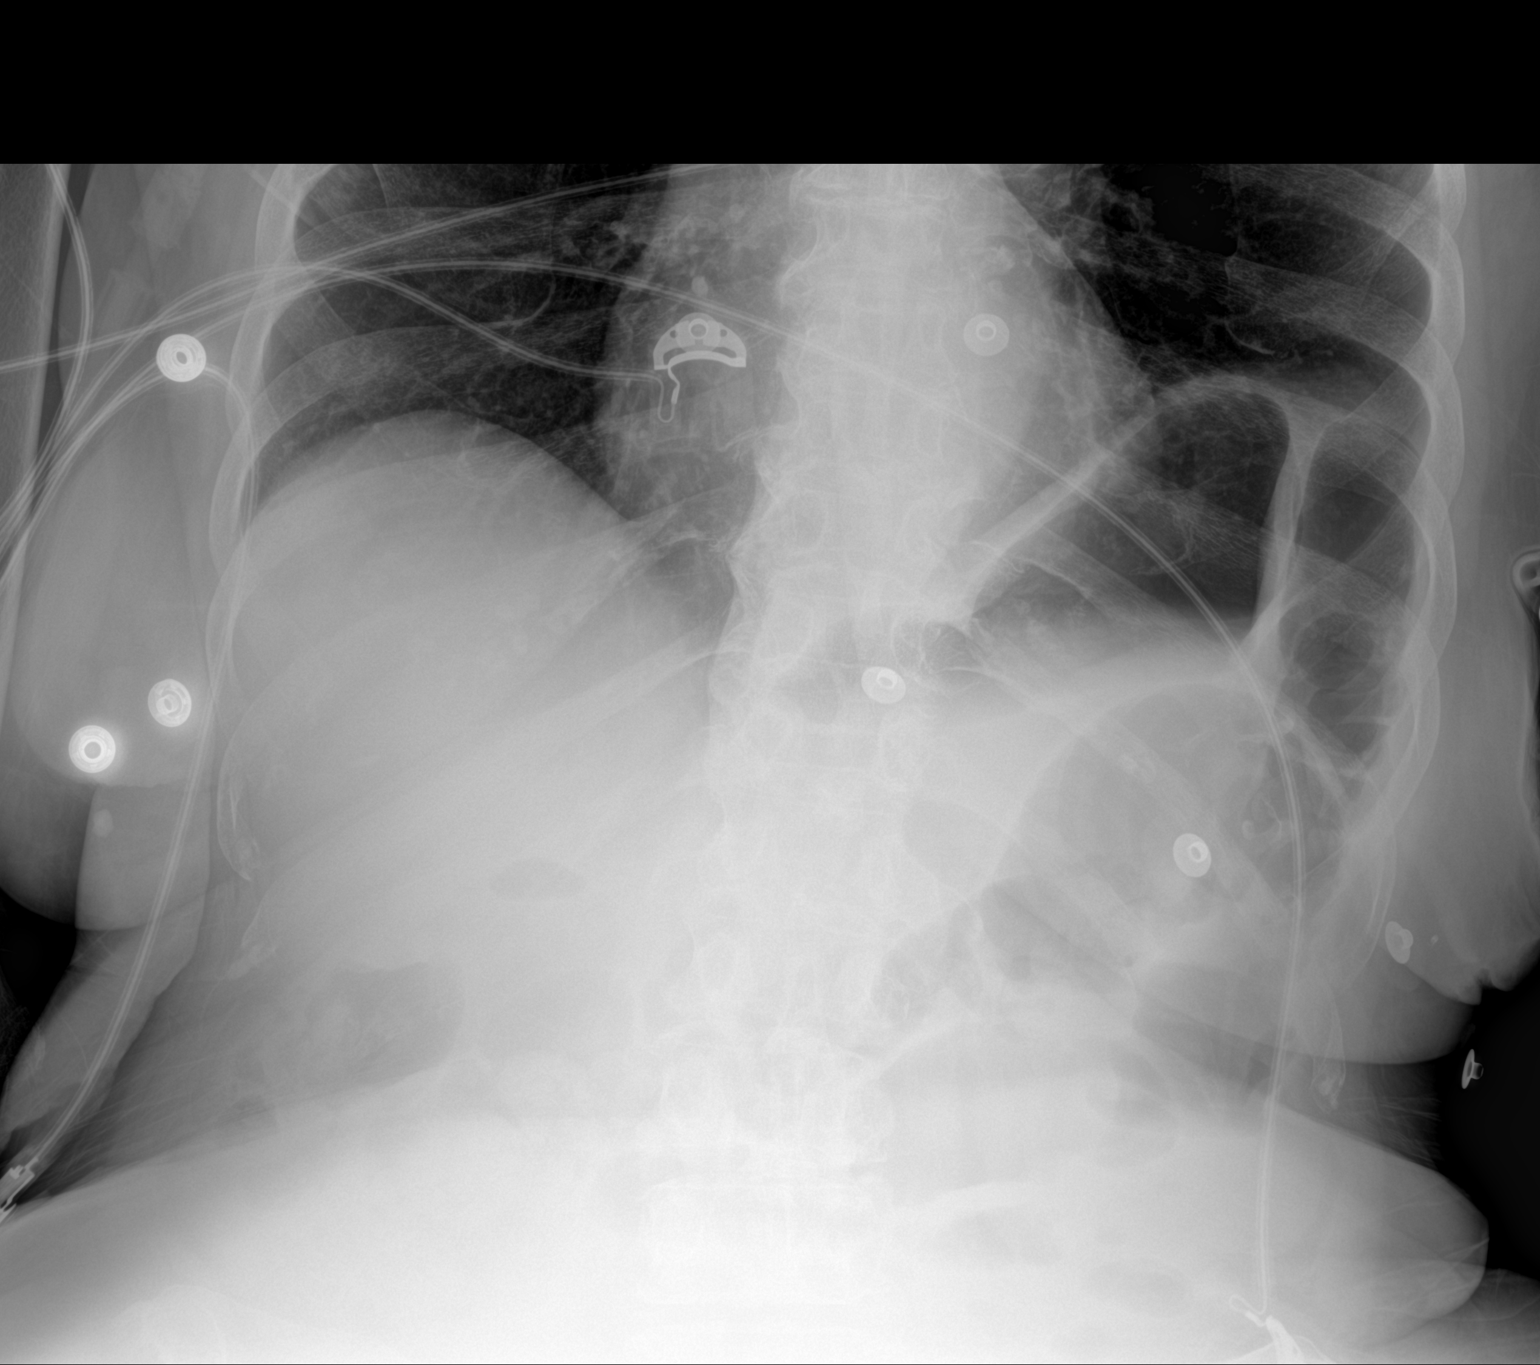
[im 2/3]
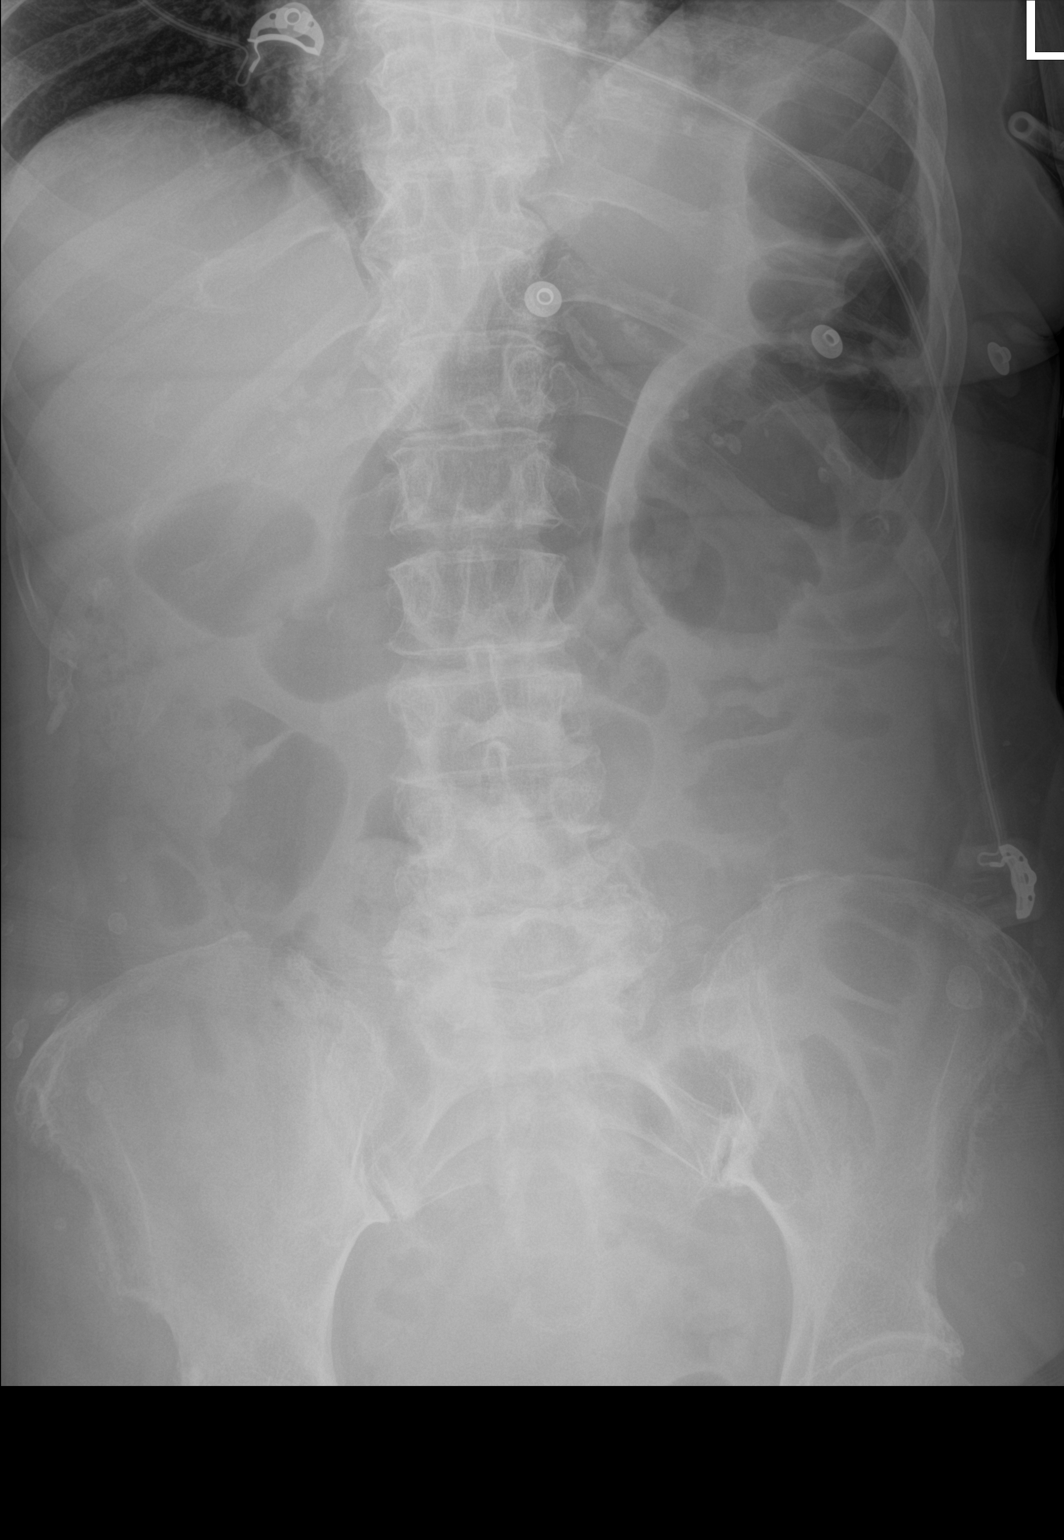
[im 3/3]
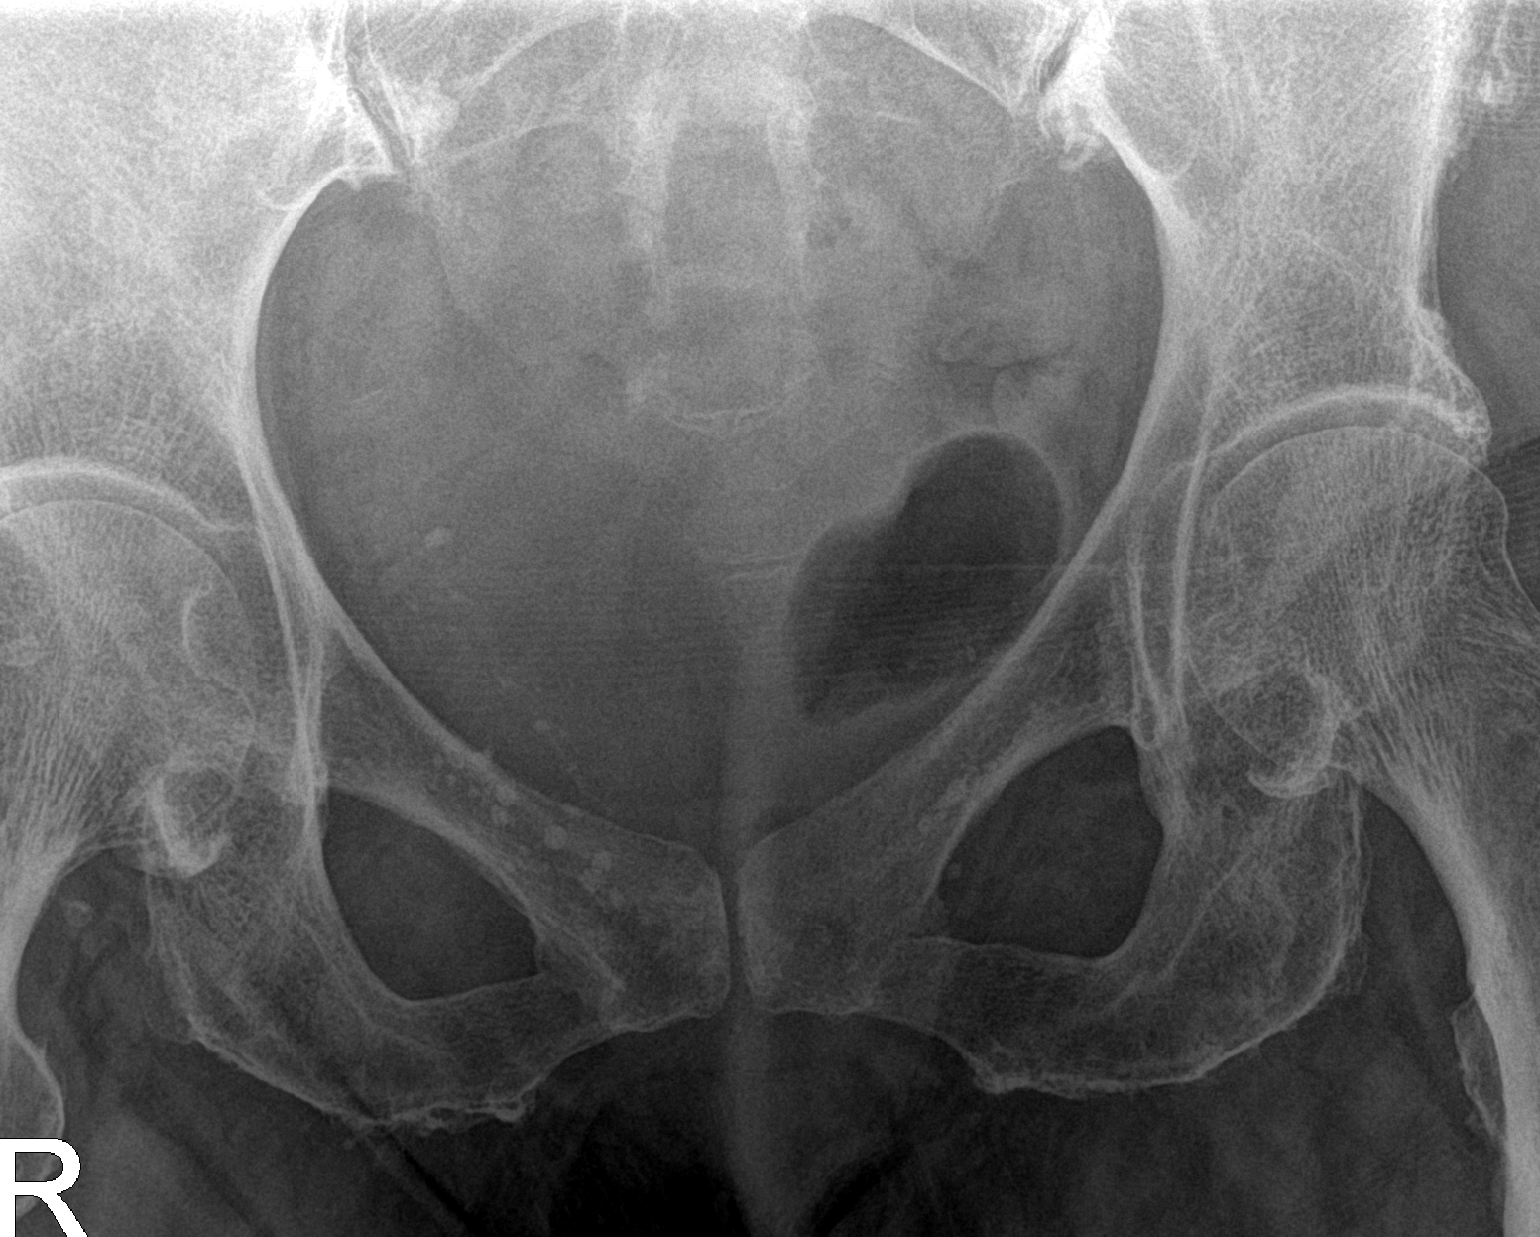

[3 of 3 positions shown; findings below may reference images not displayed]

FINDINGS: Scattered large and small bowel gas is noted. Some prominence of the
small bowel in the left mid abdomen is noted although somewhat
decreased when compared with the prior exam. No free air is seen. No
mass lesion is noted. Degenerative changes of lumbar spine are seen.
IMPRESSION: Mildly prominent small bowel the left mid abdomen but appears lesser
than that seen on prior examination. Repeat CT may be helpful for
further evaluation.

## 2020-10-19 MED ORDER — ONDANSETRON 4 MG PO TBDP: 4.0000 mg | ORAL_TABLET | Freq: Three times a day (TID) | ORAL | 0 refills | Status: DC | PRN

## 2020-10-19 MED ORDER — ONDANSETRON HCL 4 MG/2ML IJ SOLN
4.0000 mg | Freq: Once | INTRAMUSCULAR | Status: AC
  Administered 2020-10-19: 4 mg via INTRAVENOUS
  Filled 2020-10-19: qty 2

## 2020-10-19 MED ORDER — SODIUM CHLORIDE 0.9 % IV BOLUS
1000.0000 mL | Freq: Once | INTRAVENOUS | Status: AC
  Administered 2020-10-19: 1000 mL via INTRAVENOUS

## 2020-10-19 NOTE — Telephone Encounter (Signed)
[facility] FYI:  ---Glenard Haring states that Angelica [name] developed irregular heart rhythm today. No severe breathing difficulty orchest pain. She was seen in the ER three days ago for increased right, lower abdominal pain with vomiting (1 episode in the past 24 hours). She rated her abdominal pain as an 8 on the 1 to 10 scale that had gone away after taking pain med. No fever. Alert and responsive  Advised to go to ED now, patient understood

## 2020-10-19 NOTE — ED Notes (Signed)
Pt given food and water for fluid/po challenge,

## 2020-10-19 NOTE — ED Provider Notes (Signed)
[facility] EMERGENCY DEPARTMENT Provider Note   CSN: [id number] Arrival date & time: 10/19/20  1506     History No chief complaint on file.   Angelica [name] is a 80 y.o. female.  HPI Patient presents with abdominal pain nausea and vomiting.  Recently seen in the ER for the same.  Went home feeling better.  Then developed some more nausea and vomiting.  Still having bowel movements however.  Had some left lower abdominal pain.  States that feeling somewhat better now.  Patient and family member have been trying to figure out a good meal plan to get help keep food in.  Is on immunotherapy for lung cancer.  Has had previous bowel obstructions.  CT scan done 2 days ago for same and reassuring.  Had home health today.  Reportedly had some hypotension with pressure in the 90s and PVCs on EKG.    Past Medical History:  Diagnosis Date  . Arthritis   . Asthma   . Bowel obstruction ([place])   . Cancer ([place])   . Environmental allergies   . GERD (gastroesophageal reflux disease)   . Glaucoma 05/09/2017  . H. pylori infection   . History of radiation therapy 07/05/20-08/05/20   Left lung IMRT Dr. Sondra Come   . HLD (hyperlipidemia) 01/16/2019  . Hypertension   . Iron deficiency anemia   . Osteopenia 05/26/2017  . Thyroid disease   . Urticaria 07/25/2018    Patient Active Problem List   Diagnosis Date Noted  . Encounter for antineoplastic immunotherapy 08/31/2020  . Adenocarcinoma of left lung, stage 3 ([place]) 06/22/2020  . Encounter for antineoplastic chemotherapy 06/22/2020  . Lung mass   . Adenopathy 06/09/2020  . Epigastric abdominal tenderness with rebound tenderness 05/17/2020  . Pulmonary nodule 05/17/2020  . Iron deficiency 05/03/2020  . Dizziness 01/19/2020  . Left rotator cuff tear 11/12/2019  . Trigger thumb of right hand 11/12/2019  . Trigger finger, left ring finger 11/12/2019  . Left shoulder pain 09/29/2019  . Swelling of lower extremity 07/30/2019  .  Greater trochanteric bursitis of left hip 04/07/2019  . Right ankle swelling 02/18/2019  . Ganglion cyst of right foot 01/16/2019  . HLD (hyperlipidemia) 01/16/2019  . 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 Urticaria 07/25/2018  . Low back pain 07/09/2018  . Hyperglycemia 07/09/2018  . Rash 10/10/2017  . Constipation 08/19/2017  . Hypokalemia 07/09/2017  . Asthma, severe persistent, well-controlled 06/04/2017  . 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 Nasal polyposis 06/04/2017  . Leukocytosis 06/04/2017  . Osteopenia 05/26/2017  . Encounter for well adult exam with abnormal findings 05/09/2017  . Glaucoma 05/09/2017  . GERD (gastroesophageal reflux disease)   . H. pylori infection   . Other allergic rhinitis 03/20/2016  . Cough, persistent 03/20/2016  . Hypoglycemia 04/17/2015  . Small bowel obstruction ([place])   . Malnutrition of moderate degree 04/11/2015  . SBO (small bowel obstruction) ([place]) 04/10/2015  . HTN (hypertension) 04/10/2015    Past Surgical History:  Procedure Laterality Date  . ABDOMINAL HYSTERECTOMY    . BOWEL RESECTION N/A 05/23/2020   Procedure: SMALL BOWEL REPAIR;  Surgeon: Kinsinger, Arta Bruce, MD;  Location: [facility];  Service: General;  Laterality: N/A;  . BREAST BIOPSY    . BRONCHIAL BIOPSY  06/10/2020   Procedure: BRONCHIAL BIOPSIES;  Surgeon: Garner Nash, DO;  Location: [facility];  Service: Pulmonary;;  . BRONCHIAL BRUSHINGS  06/10/2020   Procedure: BRONCHIAL BRUSHINGS;  Surgeon: Garner Nash, DO;  Location: [facility];  Service: Pulmonary;;  . BRONCHIAL NEEDLE ASPIRATION BIOPSY  06/10/2020   Procedure: BRONCHIAL NEEDLE ASPIRATION BIOPSIES;  Surgeon: Garner Nash, DO;  Location: [facility];  Service: Pulmonary;;  . BRONCHIAL WASHINGS  06/10/2020   Procedure: BRONCHIAL WASHINGS;  Surgeon: Garner Nash, DO;  Location: [facility];  Service: Pulmonary;;  . COLON SURGERY    . DIAGNOSTIC LARYNGOSCOPY N/A 05/23/2020   Procedure:  ATTEMPTED DIAGNOSTIC LAPAROSCOPY WITH ADHESIONS;  Surgeon: Kieth Brightly, Arta Bruce, MD;  Location: [facility];  Service: General;  Laterality: N/A;  . IR IMAGING GUIDED PORT INSERTION  07/15/2020  . KNEE SURGERY    . LAPAROTOMY N/A 04/18/2015   Procedure: Exploratory laparotomy with lysis of adhesions, possible bowel resection;  Surgeon: Ralene Ok, MD;  Location: [facility];  Service: General;  Laterality: N/A;  . LAPAROTOMY N/A 05/23/2020   Procedure: EXPLORATORY LAPAROTOMY;  Surgeon: Kieth Brightly Arta Bruce, MD;  Location: [facility];  Service: General;  Laterality: N/A;  . LYSIS OF ADHESION N/A 05/23/2020   Procedure: LYSIS OF ADHESION;  Surgeon: Kieth Brightly Arta Bruce, MD;  Location: [facility];  Service: General;  Laterality: N/A;  . NASAL SINUS SURGERY    . THYROID SURGERY    . VIDEO BRONCHOSCOPY WITH ENDOBRONCHIAL NAVIGATION Bilateral 06/10/2020   Procedure: VIDEO BRONCHOSCOPY WITH ENDOBRONCHIAL NAVIGATION;  Surgeon: Garner Nash, DO;  Location: [facility];  Service: Pulmonary;  Laterality: Bilateral;  . VIDEO BRONCHOSCOPY WITH ENDOBRONCHIAL ULTRASOUND  06/10/2020   Procedure: VIDEO BRONCHOSCOPY WITH ENDOBRONCHIAL ULTRASOUND;  Surgeon: Garner Nash, DO;  Location: [facility];  Service: Pulmonary;;     OB History   No obstetric history on file.     Family History  Problem Relation Age of Onset  . Diabetes Mother   . Hypertension Mother   . Hypertension Father   . Glaucoma Sister   . Glaucoma Brother   . Allergic rhinitis Neg Hx   . Angioedema Neg Hx   . Asthma Neg Hx   . Eczema Neg Hx   . Immunodeficiency Neg Hx   . Urticaria Neg Hx     Social History   Tobacco Use  . Smoking status: Former Smoker    Types: Cigarettes  . Smokeless tobacco: Never Used  . Tobacco comment: quit 60 years ago  Vaping Use  . Vaping Use: Never used  Substance Use Topics  . Alcohol use: No  . Drug use: No    Home Medications Prior to Admission medications   Medication Sig Start Date End Date  Taking? Authorizing Provider  ondansetron (ZOFRAN-ODT) 4 MG disintegrating tablet Take 1 tablet (4 mg total) by mouth every 8 (eight) hours as needed for nausea or vomiting. 10/19/20  Yes Davonna Belling, MD  acetaminophen (TYLENOL) 325 MG tablet Take 2 tablets (650 mg total) by mouth every 6 (six) hours as needed for headache. Patient taking differently: Take 325 mg by mouth every 6 (six) hours as needed for headache or mild pain. 06/03/20   Saverio Danker, PA-C  amLODipine (NORVASC) 10 MG tablet Take 1 tablet by mouth once daily 08/08/20   [name], MD  Benralizumab [facility] PEN) 30 MG/ML SOAJ Inject into the skin every 8 (eight) weeks.    [provider]  budesonide-formoterol (SYMBICORT) 80-4.5 MCG/ACT inhaler Inhale 2 puffs into the lungs 2 (two) times daily. Use for asthma flares 2 puffs twice a day for 2 weeks or until cough and wheeze free Patient taking differently: Inhale 2 puffs into the lungs  2 (two) times daily as needed (for asthma flares, for 2 weeks, or until cough and wheeze-free). 01/28/20   Kennith Gain, MD  Cholecalciferol (VITAMIN D3) 50 MCG (2000 UT) TABS Take 2,000 Units by mouth daily.    [provider]  EPINEPHrine (EPIPEN 2-PAK) 0.3 mg/0.3 mL IJ SOAJ injection Use as directed for severe allergic reaction Patient taking differently: Inject 0.3 mg into the muscle as needed for anaphylaxis. 01/28/20   Kennith Gain, MD  ipratropium (ATROVENT) 0.06 % nasal spray Place 2 sprays into both nostrils 3 (three) times daily. 07/28/20   Kennith Gain, MD  linaclotide (LINZESS) 72 MCG capsule TAKE 1 CAPSULE BY MOUTH ONCE DAILY AS NEEDED Patient taking differently: Take 72 mcg by mouth daily as needed (constipation). 06/14/20   [name], MD  loratadine (CLARITIN) 10 MG tablet Take 10 mg by mouth daily.    [provider]  losartan (COZAAR) 100 MG tablet Take 100 mg by mouth daily.    [provider]  Magnesium  Hydroxide (PHILLIPS MILK OF MAGNESIA PO) Take 15-30 mLs by mouth daily as needed (for constipation).    [provider]  montelukast (SINGULAIR) 10 MG tablet Take 1 tablet (10 mg total) by mouth at bedtime. Patient taking differently: Take 10 mg by mouth as needed. 08/15/20   Kennith Gain, MD  pravastatin (PRAVACHOL) 80 MG tablet Take 1 tablet (80 mg total) by mouth daily. 06/14/20   [name], MD  prochlorperazine (COMPAZINE) 10 MG tablet Take 1 tablet (10 mg total) by mouth every 6 (six) hours as needed for nausea or vomiting. 06/22/20   Curt Bears, MD  telmisartan (MICARDIS) 80 MG tablet Take 1 tablet (80 mg total) by mouth daily. 07/14/20   [name], MD  traMADol (ULTRAM) 50 MG tablet Take 1 tablet (50 mg total) by mouth every 6 (six) hours as needed for moderate pain. 08/01/20   [name], MD  vitamin B-12 (CYANOCOBALAMIN) 1000 MCG tablet Take 1,000 mcg by mouth daily.    [provider]    Allergies    Asa buff (mag [buffered aspirin], Ensure [nutritional supplements], Fish allergy, Milk-related compounds, Other, Peanut-containing drug products, Penicillins, Shellfish-derived products, and Strawberry extract  Review of Systems   Review of Systems  Constitutional: Positive for appetite change and fatigue. Negative for fever.  HENT: Negative for congestion.   Respiratory: Negative for shortness of breath.   Cardiovascular: Negative for chest pain.  Gastrointestinal: Positive for abdominal pain, nausea and vomiting.  Genitourinary: Negative for flank pain.  Musculoskeletal: Negative for back pain.  Skin: Negative for rash.  Neurological: Negative for seizures.  Psychiatric/Behavioral: Negative for confusion.    Physical Exam Updated Vital Signs BP 135/70   Pulse 96   Temp 98.4 F (36.9 C) (Oral)   Resp 20   SpO2 99%   Physical Exam Vitals and nursing note reviewed.  HENT:     Head: Atraumatic.     Right Ear: Tympanic membrane  normal.     Mouth/Throat:     Mouth: Mucous membranes are moist.  Eyes:     Pupils: Pupils are equal, round, and reactive to light.  Cardiovascular:     Rate and Rhythm: Regular rhythm.  Pulmonary:     Breath sounds: No wheezing or rhonchi.  Abdominal:     Comments: Mild left-sided abdominal tenderness.  No rebound or guarding.  No hernia palpated.  Musculoskeletal:  General: No tenderness.     Cervical back: Neck supple.  Skin:    Capillary Refill: Capillary refill takes less than 2 seconds.  Neurological:     General: No focal deficit present.     Mental Status: She is alert and oriented to person, place, and time.     ED Results / Procedures / Treatments   Labs (all labs ordered are listed, but only abnormal results are displayed) Labs Reviewed  COMPREHENSIVE METABOLIC PANEL - Abnormal; Notable for the following components:      Result Value   Sodium 130 (*)    CO2 19 (*)    Glucose, Bld 117 (*)    All other components within normal limits  CBC WITH DIFFERENTIAL/PLATELET  LIPASE, BLOOD  URINALYSIS, ROUTINE W REFLEX MICROSCOPIC  TROPONIN I (HIGH SENSITIVITY)  TROPONIN I (HIGH SENSITIVITY)    EKG EKG Interpretation  Date/Time:  Wednesday October 19 2020 15:15:32 EDT Ventricular Rate:  106 PR Interval:  128 QRS Duration: 80 QT Interval:  338 QTC Calculation: 448 R Axis:   -14 Text Interpretation: Sinus tachycardia Minimal voltage criteria for LVH, may be normal variant ( R in aVL ) Possible Lateral infarct , age undetermined Abnormal ECG Confirmed by Davonna Belling [phone]) on 10/19/2020 6:57:06 PM   Radiology DG Chest Portable 1 View  Result Date: 10/19/2020 CLINICAL DATA:  80 year old female with weakness. EXAM: PORTABLE CHEST 1 VIEW COMPARISON:  Chest radiograph dated 10/11/2020. FINDINGS: Right-sided Port-A-Cath tip over central SVC in similar position. Minimal left lung base atelectasis. No consolidative changes. There is no pleural effusion pneumothorax.  The cardiac silhouette is within limits. No acute osseous pathology. Thyroidectomy surgical clips. IMPRESSION: No active cardiopulmonary disease. Electronically Signed   By: Anner Crete M.D.   On: 10/19/2020 16:01    Procedures Procedures   Medications Ordered in ED Medications  sodium chloride 0.9 % bolus 1,000 mL (1,000 mLs Intravenous New Bag/Given 10/19/20 2012)  ondansetron (ZOFRAN) injection 4 mg (4 mg Intravenous Given 10/19/20 2011)    ED Course  I have reviewed the triage vital signs and the nursing notes.  Pertinent labs & imaging results that were available during my care of the patient were reviewed by me and considered in my medical decision making (see chart for details).    MDM Rules/Calculators/A&P                          Patient presents with nausea vomiting and hypotension.  Some abdominal pain.  Reportedly seen by home health and had PVCs on EKG and sent in for further evaluation.  Patient has had episodes of nausea vomiting along with her chemo treatment.  Had a CT scan done 2 days ago that did not show obstruction although she has had previous bowel obstructions.  Had been doing well and after Zofran was eating sandwiches.  Some mild hyponatremia but labs otherwise reassuring.  Urine had not been done yet today but patient was feeling better although had persistent tachycardia.  However then patient vomited up all the food she been eating.  With continued tachycardia after fluid bolus nausea and vomiting despite having IV antiemetics I feel that the patient benefit from mission to the hospital.  We will add an abdominal x-ray at this time although I do not think we need immediate CT scanning.  Will discuss with hospitalist for admission. Final Clinical Impression(s) / ED Diagnoses Final diagnoses:  Dehydration  Vomiting    Rx /  DC Orders ED Discharge Orders         Ordered    ondansetron (ZOFRAN-ODT) 4 MG disintegrating tablet  Every 8 hours PRN        10/19/20  2224           Davonna Belling, MD 10/19/20 2234

## 2020-10-19 NOTE — ED Triage Notes (Signed)
Pt arrives with daughter who reports hypotension this morning. EMS came to house and did EKG which was abnormal and home health nurse called her MD who advised her to come to ED. Pt doing immunotherapy for lung cancer.

## 2020-10-19 NOTE — ED Provider Notes (Signed)
Emergency Medicine Provider Triage Evaluation Note  Angelica [name] , a 80 y.o. female  was evaluated in triage.  Pt complains of abnormal EKG.  Review of Systems  Positive: Weakness, n/v/d Negative: Fever, dysuria,   Physical Exam  BP 113/67 (BP Location: Right Arm)   Pulse (!) 102   Temp 98.2 F (36.8 C) (Oral)   Resp 14   SpO2 100%  Gen:   Awake, ill appearing Resp:  Normal effort  MSK:   Moves extremities without difficulty  Other:    Medical Decision Making  Medically screening exam initiated at 3:24 PM.  Appropriate orders placed.  Angelica [name] was informed that the remainder of the evaluation will be completed by another provider, this initial triage assessment does not replace that evaluation, and the importance of remaining in the ED until their evaluation is complete.  Hx of lung cancer currently on immunomodulator medication, have had n/v/d and weakness while on treatment.  homehealth nurse report pt had abnormal EKG today.  Pt denies cp.    Domenic Moras, PA-C 10/19/20 1526    Lucrezia Starch, MD 10/20/20 [phone]

## 2020-10-19 NOTE — Telephone Encounter (Signed)
   Angel from [facility] calling to report patient had episode of vomiting today Blood pressure 90/70, 112/74 Increased weakness Abnormal rhythm, ECG frequent PVC's EMS was called earlier today, but didn't advise patient on going to ED or staying home, only spoke of wait time at ED  Call transferred to [facility]

## 2020-10-19 NOTE — ED Notes (Signed)
Pt failed fluid challenge. Vomiting episode x1.

## 2020-10-20 DIAGNOSIS — E871 Hypo-osmolality and hyponatremia: Secondary | ICD-10-CM | POA: Diagnosis present

## 2020-10-20 DIAGNOSIS — E860 Dehydration: Principal | ICD-10-CM

## 2020-10-20 DIAGNOSIS — Z91013 Allergy to seafood: Secondary | ICD-10-CM | POA: Diagnosis not present

## 2020-10-20 DIAGNOSIS — R112 Nausea with vomiting, unspecified: Secondary | ICD-10-CM | POA: Diagnosis present

## 2020-10-20 DIAGNOSIS — R634 Abnormal weight loss: Secondary | ICD-10-CM | POA: Diagnosis present

## 2020-10-20 DIAGNOSIS — Z923 Personal history of irradiation: Secondary | ICD-10-CM | POA: Diagnosis not present

## 2020-10-20 DIAGNOSIS — E785 Hyperlipidemia, unspecified: Secondary | ICD-10-CM | POA: Diagnosis present

## 2020-10-20 DIAGNOSIS — Z880 Allergy status to penicillin: Secondary | ICD-10-CM | POA: Diagnosis not present

## 2020-10-20 DIAGNOSIS — Z9221 Personal history of antineoplastic chemotherapy: Secondary | ICD-10-CM | POA: Diagnosis not present

## 2020-10-20 DIAGNOSIS — Z91018 Allergy to other foods: Secondary | ICD-10-CM | POA: Diagnosis not present

## 2020-10-20 DIAGNOSIS — Z20822 Contact with and (suspected) exposure to covid-19: Secondary | ICD-10-CM | POA: Diagnosis present

## 2020-10-20 DIAGNOSIS — Z886 Allergy status to analgesic agent status: Secondary | ICD-10-CM | POA: Diagnosis not present

## 2020-10-20 DIAGNOSIS — J45909 Unspecified asthma, uncomplicated: Secondary | ICD-10-CM | POA: Diagnosis present

## 2020-10-20 DIAGNOSIS — G8929 Other chronic pain: Secondary | ICD-10-CM | POA: Diagnosis present

## 2020-10-20 DIAGNOSIS — Z6824 Body mass index (BMI) 24.0-24.9, adult: Secondary | ICD-10-CM | POA: Diagnosis not present

## 2020-10-20 DIAGNOSIS — C3490 Malignant neoplasm of unspecified part of unspecified bronchus or lung: Secondary | ICD-10-CM | POA: Diagnosis present

## 2020-10-20 DIAGNOSIS — Z888 Allergy status to other drugs, medicaments and biological substances status: Secondary | ICD-10-CM | POA: Diagnosis not present

## 2020-10-20 DIAGNOSIS — K219 Gastro-esophageal reflux disease without esophagitis: Secondary | ICD-10-CM | POA: Diagnosis present

## 2020-10-20 DIAGNOSIS — Z91010 Allergy to peanuts: Secondary | ICD-10-CM | POA: Diagnosis not present

## 2020-10-20 DIAGNOSIS — R5381 Other malaise: Secondary | ICD-10-CM | POA: Diagnosis present

## 2020-10-20 DIAGNOSIS — E861 Hypovolemia: Secondary | ICD-10-CM | POA: Diagnosis present

## 2020-10-20 DIAGNOSIS — I10 Essential (primary) hypertension: Secondary | ICD-10-CM | POA: Diagnosis present

## 2020-10-20 DIAGNOSIS — E876 Hypokalemia: Secondary | ICD-10-CM | POA: Diagnosis not present

## 2020-10-20 DIAGNOSIS — T451X5A Adverse effect of antineoplastic and immunosuppressive drugs, initial encounter: Secondary | ICD-10-CM | POA: Diagnosis present

## 2020-10-20 LAB — CBC
HCT: 37.4 % (ref 36.0–46.0)
Hemoglobin: 12.5 g/dL (ref 12.0–15.0)
MCH: 30.6 pg (ref 26.0–34.0)
MCHC: 33.4 g/dL (ref 30.0–36.0)
MCV: 91.4 fL (ref 80.0–100.0)
Platelets: 255 10*3/uL (ref 150–400)
RBC: 4.09 MIL/uL (ref 3.87–5.11)
RDW: 13.1 % (ref 11.5–15.5)
WBC: 8.7 10*3/uL (ref 4.0–10.5)
nRBC: 0 % (ref 0.0–0.2)

## 2020-10-20 LAB — URINALYSIS, ROUTINE W REFLEX MICROSCOPIC
Bacteria, UA: NONE SEEN
Bilirubin Urine: NEGATIVE
Glucose, UA: NEGATIVE mg/dL
Hgb urine dipstick: NEGATIVE
Ketones, ur: 20 mg/dL — AB
Leukocytes,Ua: NEGATIVE
Nitrite: NEGATIVE
Protein, ur: 30 mg/dL — AB
Specific Gravity, Urine: 1.014 (ref 1.005–1.030)
pH: 5 (ref 5.0–8.0)

## 2020-10-20 LAB — BASIC METABOLIC PANEL
Anion gap: 12 (ref 5–15)
BUN: 13 mg/dL (ref 8–23)
CO2: 20 mmol/L — ABNORMAL LOW (ref 22–32)
Calcium: 9.2 mg/dL (ref 8.9–10.3)
Chloride: 101 mmol/L (ref 98–111)
Creatinine, Ser: 0.76 mg/dL (ref 0.44–1.00)
GFR, Estimated: >60 mL/min (ref >60)
Glucose, Bld: 106 mg/dL — ABNORMAL HIGH (ref 70–99)
Potassium: 3.8 mmol/L (ref 3.5–5.1)
Sodium: 133 mmol/L — ABNORMAL LOW (ref 135–145)

## 2020-10-20 LAB — PHOSPHORUS: Phosphorus: 3.9 mg/dL (ref 2.5–4.6)

## 2020-10-20 LAB — MAGNESIUM: Magnesium: 1.7 mg/dL (ref 1.7–2.4)

## 2020-10-20 LAB — TSH: TSH: 1.179 u[IU]/mL (ref 0.350–4.500)

## 2020-10-20 LAB — SARS CORONAVIRUS 2 (TAT 6-24 HRS): SARS Coronavirus 2: NEGATIVE

## 2020-10-20 MED ORDER — CHLORHEXIDINE GLUCONATE CLOTH 2 % EX PADS
6.0000 | MEDICATED_PAD | Freq: Every day | CUTANEOUS | Status: DC
  Administered 2020-10-20 – 2020-10-22 (×3): 6 via TOPICAL

## 2020-10-20 MED ORDER — PRAVASTATIN SODIUM 40 MG PO TABS
80.0000 mg | ORAL_TABLET | Freq: Every day | ORAL | Status: DC
  Administered 2020-10-20 – 2020-10-22 (×3): 80 mg via ORAL
  Filled 2020-10-20 (×3): qty 2

## 2020-10-20 MED ORDER — TRAMADOL HCL 50 MG PO TABS
50.0000 mg | ORAL_TABLET | Freq: Two times a day (BID) | ORAL | Status: DC | PRN
Start: 2020-10-20 — End: 2020-10-22
  Administered 2020-10-20 (×2): 50 mg via ORAL
  Filled 2020-10-20 (×3): qty 1

## 2020-10-20 MED ORDER — AMLODIPINE BESYLATE 5 MG PO TABS
5.0000 mg | ORAL_TABLET | Freq: Every day | ORAL | Status: DC
  Administered 2020-10-20 – 2020-10-21 (×2): 5 mg via ORAL
  Filled 2020-10-20 (×2): qty 1

## 2020-10-20 MED ORDER — LINACLOTIDE 72 MCG PO CAPS
72.0000 ug | ORAL_CAPSULE | Freq: Every day | ORAL | Status: DC | PRN
  Filled 2020-10-20: qty 1

## 2020-10-20 MED ORDER — SODIUM CHLORIDE 0.9 % IV SOLN
12.5000 mg | Freq: Four times a day (QID) | INTRAVENOUS | Status: DC | PRN
  Administered 2020-10-20: 12.5 mg via INTRAVENOUS
  Filled 2020-10-20 (×2): qty 0.5

## 2020-10-20 MED ORDER — VITAMIN B-12 1000 MCG PO TABS
1000.0000 ug | ORAL_TABLET | Freq: Every day | ORAL | Status: DC
  Administered 2020-10-20 – 2020-10-22 (×3): 1000 ug via ORAL
  Filled 2020-10-20 (×3): qty 1

## 2020-10-20 MED ORDER — KCL-LACTATED RINGERS-D5W 20 MEQ/L IV SOLN
INTRAVENOUS | Status: DC
  Filled 2020-10-20 (×2): qty 1000

## 2020-10-20 MED ORDER — ACETAMINOPHEN 325 MG PO TABS
650.0000 mg | ORAL_TABLET | Freq: Four times a day (QID) | ORAL | Status: DC | PRN
  Administered 2020-10-20 – 2020-10-21 (×2): 650 mg via ORAL
  Filled 2020-10-20 (×2): qty 2

## 2020-10-20 MED ORDER — MOMETASONE FURO-FORMOTEROL FUM 100-5 MCG/ACT IN AERO
2.0000 | INHALATION_SPRAY | Freq: Two times a day (BID) | RESPIRATORY_TRACT | Status: DC
  Administered 2020-10-20 – 2020-10-22 (×5): 2 via RESPIRATORY_TRACT
  Filled 2020-10-20: qty 8.8

## 2020-10-20 MED ORDER — SENNOSIDES-DOCUSATE SODIUM 8.6-50 MG PO TABS
1.0000 | ORAL_TABLET | Freq: Every day | ORAL | Status: DC
  Administered 2020-10-20 – 2020-10-21 (×2): 1 via ORAL
  Filled 2020-10-20 (×2): qty 1

## 2020-10-20 NOTE — Progress Notes (Signed)
Patient placed in observation after midnight but care began in the ER prior to midnight.  Please see H&P.  Here with N/V and poor Po intake.  Will change to inpt as will need > 2 midnights of IVF and IV nausea meds. Eulogio Bear DO

## 2020-10-20 NOTE — ED Notes (Signed)
Walked patient to the bathroom patient did well patient is now back in bed on the monitor with family at bedside and call bell in reach

## 2020-10-20 NOTE — Progress Notes (Signed)
Acute urinary retention reported by bedside RN with 444 mL urine from bladder scan.  Ordered in and out cath per protocol.

## 2020-10-20 NOTE — H&P (Addendum)
History and Physical  Angelica [name] RCV:[id number] DOB: 03-22-1941 DOA: 10/19/2020  Referring physician: Dr. Alvino Chapel, [place] PCP: [name], MD  Outpatient Specialists: Hematology oncology Dr. Earlie Server. Patient coming from: Home.  Chief Complaint: Nausea vomiting abdominal pain.  HPI: Angelica [name] is a 80 y.o. 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 Earlie Server, medical oncology, essential hypertension, chronic abdominal pain, who presented to [facility] ED due to worsening nausea vomiting and inability to keep anything down x1 day.  Associated with unintentional weight loss 5 to 7 pounds for the past 2 weeks.  History is mainly obtained by her daughter at bedside who is also her medical power of attorney.  Per her daughter at bedside onset of nausea and vomiting was on Sunday 4 days prior to her presentation.  Patient was taken to [facility] where she received IV antiemetics, IV fluid and IV morphine with improvement of her symptomatology.  She was discharged home.  On Monday and Tuesday patient was doing fine.  On Wednesday on the day of presentation she had sudden onset nausea and vomiting and could not keep anything down.  Her daughter brought her to the ED for further evaluation.  While at [facility] ED patient began vomiting despite IV Zofran.  Due to concern for dehydration EDP requested admission for IV fluid and IV antiemetics.  TRH, hospitalist team, was asked to admit.  ED Course:  Afebrile.  BP 145/82, pulse 96, respiration rate 24, O2 saturation 97% on room air.  Review of Systems: Review of systems as noted in the HPI. All other systems reviewed and are negative.   Past Medical History:  Diagnosis Date  . Arthritis   . Asthma   . Bowel obstruction ([place])   . Cancer ([place])   . Environmental allergies   . GERD (gastroesophageal reflux disease)   . Glaucoma 05/09/2017  . H. pylori infection   .  History of radiation therapy 07/05/20-08/05/20   Left lung IMRT Dr. Sondra Come   . HLD (hyperlipidemia) 01/16/2019  . Hypertension   . Iron deficiency anemia   . Osteopenia 05/26/2017  . Thyroid disease   . Urticaria 07/25/2018   Past Surgical History:  Procedure Laterality Date  . ABDOMINAL HYSTERECTOMY    . BOWEL RESECTION N/A 05/23/2020   Procedure: SMALL BOWEL REPAIR;  Surgeon: Kinsinger, Arta Bruce, MD;  Location: [place];  Service: General;  Laterality: N/A;  . BREAST BIOPSY    . BRONCHIAL BIOPSY  06/10/2020   Procedure: BRONCHIAL BIOPSIES;  Surgeon: Garner Nash, DO;  Location: [place] ENDOSCOPY;  Service: Pulmonary;;  . BRONCHIAL BRUSHINGS  06/10/2020   Procedure: BRONCHIAL BRUSHINGS;  Surgeon: Garner Nash, DO;  Location: [place] ENDOSCOPY;  Service: Pulmonary;;  . BRONCHIAL NEEDLE ASPIRATION BIOPSY  06/10/2020   Procedure: BRONCHIAL NEEDLE ASPIRATION BIOPSIES;  Surgeon: Garner Nash, DO;  Location: [place] [facility]COPY;  Service: Pulmonary;;  . BRONCHIAL WASHINGS  06/10/2020   Procedure: BRONCHIAL WASHINGS;  Surgeon: Garner Nash, DO;  Location: [place] ENDOSCOPY;  Service: Pulmonary;;  . COLON SURGERY    . DIAGNOSTIC LARYNGOSCOPY N/A 05/23/2020   Procedure: ATTEMPTED DIAGNOSTIC LAPAROSCOPY WITH ADHESIONS;  Surgeon: Kieth Brightly, Arta Bruce, MD;  Location: [place];  Service: General;  Laterality: N/A;  . IR IMAGING GUIDED PORT INSERTION  07/15/2020  . KNEE SURGERY    . LAPAROTOMY N/A 04/18/2015   Procedure: Exploratory laparotomy with lysis of adhesions, possible bowel resection;  Surgeon: Anne Hahn  Rosendo Gros, MD;  Location: [place];  Service: General;  Laterality: N/A;  . LAPAROTOMY N/A 05/23/2020   Procedure: EXPLORATORY LAPAROTOMY;  Surgeon: Mickeal Skinner, MD;  Location: [place];  Service: General;  Laterality: N/A;  . LYSIS OF ADHESION N/A 05/23/2020   Procedure: LYSIS OF ADHESION;  Surgeon: Kieth Brightly Arta Bruce, MD;  Location: [place];  Service: General;  Laterality: N/A;  . NASAL SINUS SURGERY    .  THYROID SURGERY    . VIDEO BRONCHOSCOPY WITH ENDOBRONCHIAL NAVIGATION Bilateral 06/10/2020   Procedure: VIDEO BRONCHOSCOPY WITH ENDOBRONCHIAL NAVIGATION;  Surgeon: Garner Nash, DO;  Location: [place];  Service: Pulmonary;  Laterality: Bilateral;  . VIDEO BRONCHOSCOPY WITH ENDOBRONCHIAL ULTRASOUND  06/10/2020   Procedure: VIDEO BRONCHOSCOPY WITH ENDOBRONCHIAL ULTRASOUND;  Surgeon: Garner Nash, DO;  Location: [place] [facility];  Service: Pulmonary;;    Social History:  reports that she has quit smoking. Her smoking use included cigarettes. She has never used smokeless tobacco. She reports that she does not drink alcohol and does not use drugs.   Allergies  Allergen Reactions  . Asa Buff (Mag [Buffered Aspirin] Other (See Comments)    Excessive sweating  . Ensure [Nutritional Supplements]     Or boost - upset stomach   . Fish Allergy Other (See Comments)    Unknown reaction, but allergic  . Milk-Related Compounds     Doesn't drink milk but can eat cheese   . Other Other (See Comments)    Gelcaps; gel-containing capsules; extended-release medication - upset stomach   . Peanut-Containing Drug Products Other (See Comments)    From allergy test  . Penicillins Itching    Has patient had a PCN reaction causing immediate rash, facial/tongue/throat swelling, SOB or lightheadedness with hypotension: No Has patient had a PCN reaction causing severe rash involving mucus membranes or skin necrosis: No Has patient had a PCN reaction that required hospitalization No Has patient had a PCN reaction occurring within the last 10 years: No If all of the above answers are "NO", then may proceed with Cephalosporin use.  Marland Kitchen Shellfish-Derived Products Other (See Comments)    From allergy test  . Strawberry Extract Other (See Comments)    From allergy test     Family History  Problem Relation Age of Onset  . Diabetes Mother   . Hypertension Mother   . Hypertension Father   . Glaucoma Sister   .  Glaucoma Brother   . Allergic rhinitis Neg Hx   . Angioedema Neg Hx   . Asthma Neg Hx   . Eczema Neg Hx   . Immunodeficiency Neg Hx   . Urticaria Neg Hx       Prior to Admission medications   Medication Sig Start Date End Date Taking? Authorizing Provider  acetaminophen (TYLENOL) 325 MG tablet Take 2 tablets (650 mg total) by mouth every 6 (six) hours as needed for headache. Patient taking differently: Take 325 mg by mouth every 6 (six) hours as needed for headache or mild pain. 06/03/20  Yes Saverio Danker, PA-C  amLODipine (NORVASC) 10 MG tablet Take 1 tablet by mouth once daily Patient taking differently: Take 10 mg by mouth daily. 08/08/20  Yes [name], MD  Benralizumab [facility] PEN) 30 MG/ML SOAJ Inject into the skin every 8 (eight) weeks.   Yes [provider]  budesonide-formoterol (SYMBICORT) 80-4.5 MCG/ACT inhaler Inhale 2 puffs into the lungs 2 (two) times daily. Use for asthma flares 2 puffs twice a day for 2  weeks or until cough and wheeze free Patient taking differently: Inhale 2 puffs into the lungs 2 (two) times daily as needed (for asthma flares, for 2 weeks, or until cough and wheeze-free). 01/28/20  Yes Padgett, Rae Halsted, MD  Cholecalciferol (VITAMIN D3) 50 MCG (2000 UT) TABS Take 2,000 Units by mouth daily.   Yes [provider]  EPINEPHrine (EPIPEN 2-PAK) 0.3 mg/0.3 mL IJ SOAJ injection Use as directed for severe allergic reaction 01/28/20  Yes Padgett, Rae Halsted, MD  ipratropium (ATROVENT) 0.06 % nasal spray Place 2 sprays into both nostrils 3 (three) times daily. 07/28/20  Yes Padgett, Rae Halsted, MD  linaclotide [employer] [facility]) 72 MCG capsule TAKE 1 CAPSULE BY MOUTH ONCE DAILY AS NEEDED Patient taking differently: Take 72 mcg by mouth daily as needed (constipation). 06/14/20  Yes [name], MD  loratadine (CLARITIN) 10 MG tablet Take 10 mg by mouth daily.   Yes [provider]  losartan (COZAAR) 100 MG tablet Take 100 mg by  mouth daily.   Yes [provider]  Magnesium Hydroxide (PHILLIPS MILK OF MAGNESIA PO) Take 15-30 mLs by mouth daily as needed (for constipation).   Yes [provider]  montelukast (SINGULAIR) 10 MG tablet Take 1 tablet (10 mg total) by mouth at bedtime. Patient taking differently: Take 10 mg by mouth as needed. 08/15/20  Yes Padgett, Rae Halsted, MD  ondansetron (ZOFRAN-ODT) 4 MG disintegrating tablet Take 1 tablet (4 mg total) by mouth every 8 (eight) hours as needed for nausea or vomiting. 10/19/20  Yes Davonna Belling, MD  pravastatin (PRAVACHOL) 80 MG tablet Take 1 tablet (80 mg total) by mouth daily. 06/14/20  Yes [name], MD  prochlorperazine (COMPAZINE) 10 MG tablet Take 1 tablet (10 mg total) by mouth every 6 (six) hours as needed for nausea or vomiting. 06/22/20  Yes Curt Bears, MD  telmisartan (MICARDIS) 80 MG tablet Take 1 tablet (80 mg total) by mouth daily. 07/14/20  Yes [name], MD  traMADol (ULTRAM) 50 MG tablet Take 1 tablet (50 mg total) by mouth every 6 (six) hours as needed for moderate pain. 08/01/20  Yes [name], MD  vitamin B-12 (CYANOCOBALAMIN) 1000 MCG tablet Take 1,000 mcg by mouth daily.   Yes [provider]    Physical Exam: BP (!) 146/80   Pulse 96   Temp 98.4 F (36.9 C) (Oral)   Resp 19   SpO2 98%   . General: 80 y.o. year-old female well developed well nourished in no acute distress.  Alert and interactive. . Cardiovascular: Regular rate and rhythm with no rubs or gallops.  No thyromegaly or JVD noted.  No lower extremity edema. 2/4 pulses in all 4 extremities. Marland Kitchen Respiratory: Clear to auscultation with no wheezes or rales. Good inspiratory effort. . Abdomen: Soft nontender nondistended with normal bowel sounds x4 quadrants. . Muskuloskeletal: No cyanosis, clubbing or edema noted bilaterally . Neuro: CN II-XII intact, strength, sensation, reflexes . Skin: No ulcerative lesions noted or rashes . Psychiatry:  Judgement and insight appear mildly altered. Mood is appropriate for condition and setting          Labs on Admission:  Basic Metabolic Panel: Recent Labs  Lab 10/16/20 1956 10/19/20 1524  NA 134* 130*  K 3.6 3.6  CL 105 99  CO2 22 19*  GLUCOSE 122* 117*  BUN 12 13  CREATININE 0.70 0.92  CALCIUM 9.2 9.4   Liver Function Tests: Recent Labs  Lab 10/16/20 1956 10/19/20  1524  AST 20 15  ALT 13 12  ALKPHOS 76 64  BILITOT 0.4 0.4  PROT 6.7 7.1  ALBUMIN 3.5 3.5   Recent Labs  Lab 10/16/20 1956 10/19/20 1524  LIPASE 24 22   No results for input(s): AMMONIA in the last 168 hours. CBC: Recent Labs  Lab 10/16/20 1956 10/19/20 1524  WBC 10.0 7.7  NEUTROABS  --  5.9  HGB 11.5* 13.4  HCT 34.9* 39.5  MCV 92.6 91.4  PLT 209 261   Cardiac Enzymes: No results for input(s): CKTOTAL, CKMB, CKMBINDEX, TROPONINI in the last 168 hours.  BNP (last 3 results) No results for input(s): BNP in the last 8760 hours.  ProBNP (last 3 results) No results for input(s): PROBNP in the last 8760 hours.  CBG: No results for input(s): GLUCAP in the last 168 hours.  Radiological Exams on Admission: DG Chest Portable 1 View  Result Date: 10/19/2020 CLINICAL DATA:  80 year old female with weakness. EXAM: PORTABLE CHEST 1 VIEW COMPARISON:  Chest radiograph dated 10/11/2020. FINDINGS: Right-sided Port-A-Cath tip over central SVC in similar position. Minimal left lung base atelectasis. No consolidative changes. There is no pleural effusion pneumothorax. The cardiac silhouette is within limits. No acute osseous pathology. Thyroidectomy surgical clips. IMPRESSION: No active cardiopulmonary disease. Electronically Signed   By: Anner Crete M.D.   On: 10/19/2020 16:01   DG Abd 2 Views  Result Date: 10/19/2020 CLINICAL DATA:  Abdominal pain and hypotension EXAM: ABDOMEN - 2 VIEW COMPARISON:  10/16/2020 CT FINDINGS: Scattered large and small bowel gas is noted. Some prominence of the small bowel  in the left mid abdomen is noted although somewhat decreased when compared with the prior exam. No free air is seen. No mass lesion is noted. Degenerative changes of lumbar spine are seen. IMPRESSION: Mildly prominent small bowel the left mid abdomen but appears lesser than that seen on prior examination. Repeat CT may be helpful for further evaluation. Electronically Signed   By: Inez Catalina M.D.   On: 10/19/2020 22:50    EKG: I independently viewed the EKG done and my findings are as followed: Sinus tachycardia, rate of 106, nonspecific ST-T changes.  QTc 448.  Assessment/Plan Present on Admission: . Intractable nausea and vomiting  Active Problems:   Intractable nausea and vomiting  Intractable nausea and vomiting in the setting of recent immunotherapy on 10/05/2020 for non-small cell lung cancer. Last immunotherapy was on May 18, she had her second dose. Per her daughter at bedside since starting immunotherapy she has been feeling weak with nausea and vomiting intermittently.  She has tolerated chemotherapy and radiation without significant side effects per her daughter. IV fluid hydration IV antiemetics Clear liquid diet, advance diet as tolerated.  Hypovolemic hyponatremia Serum sodium 130 Continue IV fluid hydration Repeat chemistry panel in the morning.  Non anion gap metabolic acidosis Presented with serum bicarb of 19 and anion gap of 12 Continue IV fluid hydration Repeat chemistry panel in the morning.  Chronic abdominal pain Per daughter at bedside she takes tramadol daily as needed for abdominal pain Bowel regimen to avoid constipation.  Essential hypertension BP is currently stable Resume home Norvasc at lower dose 5 mg daily. Monitor vital signs  Non-small cell lung carcinoma Follows with Dr. Earlie Server Last immunotherapy was on May 18, 22  Generalized weakness/physical debility PT OT to assess Fall precautions   DVT prophylaxis: Subcu Lovenox daily  Code  Status: Full code as stated by her daughter who is also her medical  power of attorney at bedside.  Family Communication: Daughter at bedside.  Disposition Plan: Admit to MedSurg as observation status.  Consults called: None.  Admission status: Observation status.   Status is: Observation    Dispo: The patient is from: Home.              Anticipated d/c is to: Home.              Patient currently not stable for discharge due to ongoing management of intractable nausea and vomiting on IV fluid and IV antiemetics as needed.   Difficult to place patient, not applicable.      Kayleen Memos MD [facility] Pager [phone]  If 7PM-7AM, please contact night-coverage [email or web address] Password [email or web address]  10/20/2020, 4:10 AM

## 2020-10-20 NOTE — ED Notes (Addendum)
Pt resting, NAD, calm. Family at [facility]. IVF infusing. Family updated.

## 2020-10-20 NOTE — Progress Notes (Signed)
Patient arrived to 6N from ED at 1330. Alert and oriented x4, ambulated to bathroom, voided and had bowel movement. Denies nausea and pain at this time. Daughter at bedside and ordered lunch tray for patient.

## 2020-10-20 NOTE — ED Notes (Signed)
TSH drawn by venipuncture. Denies nausea. Phenergan not given.

## 2020-10-20 NOTE — Evaluation (Signed)
Occupational Therapy Evaluation Patient Details Name: Angelica [name] MRN: [id number] DOB: Jan 21, 1941 Today's Date: 10/20/2020    History of Present Illness 80 y.o. 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 Earlie Server, medical oncology, essential hypertension, chronic abdominal pain, who presented to [facility] ED due to worsening nausea vomiting and inability to keep anything down.   Clinical Impression   Patient admitted for the diagnosis above.  PTA the patient's daughter lives with her, and provides assist as needed for community mobility, setting up meds, and meals; otherwise the patient is able to complete her own ADL (sink baths), and walks around the home without an AD.  Barriers are listed below.  Currently she is needing Min A for lower body ADL and Min Guard for mobility.  Anticipate she will progress quickly once she transitions to a regular room.  No post acute OT is anticipated.  She did have home health nursing and PT coming to the home.      Follow Up Recommendations  No OT follow up    Equipment Recommendations  None recommended by OT    Recommendations for Other Services       Precautions / Restrictions Precautions Precautions: Fall Restrictions Weight Bearing Restrictions: No      Mobility Bed Mobility Overal bed mobility: Modified Independent               Patient Response: Cooperative  Transfers Overall transfer level: Needs assistance   Transfers: Sit to/from Stand;Stand Pivot Transfers Sit to Stand: Supervision Stand pivot transfers: Supervision;Min guard            Balance Overall balance assessment: Mild deficits observed, not formally tested                                         ADL either performed or assessed with clinical judgement   ADL Overall ADL's : Needs assistance/impaired                 Upper Body Dressing : Min guard;Sitting   Lower Body  Dressing: Min guard;Sit to/from stand   Toilet Transfer: Supervision/safety;Min guard   Toileting- Water quality scientist and Hygiene: Supervision/safety;Sit to/from stand       Functional mobility during ADLs: Min guard General ADL Comments: patient able to walk to bathroom in the hall and back with [employer] and no AD     Vision Patient Visual Report: No change from baseline       Perception     Praxis      Pertinent Vitals/Pain Pain Assessment: No/denies pain     Hand Dominance Right   Extremity/Trunk Assessment Upper Extremity Assessment Upper Extremity Assessment: LUE deficits/detail;Overall [facility] for tasks assessed LUE Deficits / Details: Bursitis to shoulder.  Recent cortisone injection. LUE Sensation: WNL LUE Coordination: WNL   Lower Extremity Assessment Lower Extremity Assessment: Defer to PT evaluation   Cervical / Trunk Assessment Cervical / Trunk Assessment: Kyphotic   Communication Communication Communication: No difficulties   Cognition Arousal/Alertness: Awake/alert Behavior During Therapy: WFL for tasks assessed/performed Overall Cognitive Status: Within Functional Limits for tasks assessed                                       Home Living Family/patient expects to be discharged  to:: Private residence Living Arrangements: Children Available Help at Discharge: Family;Available PRN/intermittently Type of Home: House Home Access: Level entry     Home Layout: One level     Bathroom Shower/Tub: Teacher, early years/pre: Handicapped height     Home Equipment: Environmental consultant - 2 wheels;Bedside commode          Prior Functioning/Environment Level of Independence: Independent        Comments: Daughter assists with meals, setting up meds, and community mobility.  Patient will participate with home management.        OT Problem List: Impaired balance (sitting and/or standing);Pain      OT Treatment/Interventions:  Self-care/ADL training;Therapeutic activities;Balance training    OT Goals(Current goals can be found in the care plan section) Acute Rehab OT Goals Patient Stated Goal: Be able to hold food down and go back home OT Goal Formulation: With patient Time For Goal Achievement: 11/03/20 Potential to Achieve Goals: Good  OT Frequency: Min 2X/week   Barriers to D/C:    none noted       Co-evaluation              AM-PAC OT "6 Clicks" Daily Activity     Outcome Measure Help from another person eating meals?: None Help from another person taking care of personal grooming?: None Help from another person toileting, which includes using toliet, bedpan, or urinal?: A Little Help from another person bathing (including washing, rinsing, drying)?: A Little Help from another person to put on and taking off regular upper body clothing?: None Help from another person to put on and taking off regular lower body clothing?: A Little 6 Click Score: 21   End of Session Nurse Communication: Mobility status  Activity Tolerance: Patient tolerated treatment well Patient left: in bed;with call bell/phone within reach;with family/visitor present  OT Visit Diagnosis: Unsteadiness on feet (R26.81);Pain Pain - Right/Left: Left Pain - part of body: Shoulder                Time: 7672-0947 OT Time Calculation (min): 20 min Charges:  OT General Charges $OT Visit: 1 Visit OT Evaluation $OT Eval Moderate Complexity: 1 Mod  10/20/2020  Rich, OTR/L  Acute Rehabilitation Services  Office:  [phone]   Metta Clines 10/20/2020, 12:46 PM

## 2020-10-21 LAB — CBC
HCT: 31.2 % — ABNORMAL LOW (ref 36.0–46.0)
Hemoglobin: 10.9 g/dL — ABNORMAL LOW (ref 12.0–15.0)
MCH: 31.4 pg (ref 26.0–34.0)
MCHC: 34.9 g/dL (ref 30.0–36.0)
MCV: 89.9 fL (ref 80.0–100.0)
Platelets: 216 10*3/uL (ref 150–400)
RBC: 3.47 MIL/uL — ABNORMAL LOW (ref 3.87–5.11)
RDW: 13.1 % (ref 11.5–15.5)
WBC: 5.2 10*3/uL (ref 4.0–10.5)
nRBC: 0 % (ref 0.0–0.2)

## 2020-10-21 LAB — BASIC METABOLIC PANEL
Anion gap: 3 — ABNORMAL LOW (ref 5–15)
BUN: 9 mg/dL (ref 8–23)
CO2: 24 mmol/L (ref 22–32)
Calcium: 8.8 mg/dL — ABNORMAL LOW (ref 8.9–10.3)
Chloride: 108 mmol/L (ref 98–111)
Creatinine, Ser: 0.69 mg/dL (ref 0.44–1.00)
GFR, Estimated: >60 mL/min (ref >60)
Glucose, Bld: 90 mg/dL (ref 70–99)
Potassium: 3.4 mmol/L — ABNORMAL LOW (ref 3.5–5.1)
Sodium: 135 mmol/L (ref 135–145)

## 2020-10-21 MED ORDER — IPRATROPIUM BROMIDE 0.06 % NA SOLN
2.0000 | Freq: Three times a day (TID) | NASAL | Status: DC
  Administered 2020-10-21 – 2020-10-22 (×3): 2 via NASAL
  Filled 2020-10-21 (×2): qty 15

## 2020-10-21 MED ORDER — PROMETHAZINE HCL 25 MG PO TABS: 12.5000 mg | ORAL_TABLET | Freq: Four times a day (QID) | ORAL | Status: DC | PRN

## 2020-10-21 MED ORDER — POTASSIUM CHLORIDE CRYS ER 20 MEQ PO TBCR
40.0000 meq | EXTENDED_RELEASE_TABLET | Freq: Once | ORAL | Status: AC
  Administered 2020-10-21: 40 meq via ORAL
  Filled 2020-10-21: qty 2

## 2020-10-21 NOTE — Progress Notes (Signed)
Progress Note    Angelica [name]  IOE:[id number] DOB: [birth year]  DOA: 10/19/2020 PCP: [name], MD    Brief Narrative:     Medical records reviewed and are as summarized below:  Angelica [name] is an 80 y.o. 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 Earlie Server, medical oncology, essential hypertension, chronic abdominal pain, who presented to [facility] ED due to worsening nausea vomiting and inability to keep anything down x1 day.   Assessment/Plan:   Active Problems:   Intractable nausea and vomiting   N&V (nausea and vomiting)   Intractable nausea and vomiting in the setting of recent immunotherapy on 10/05/2020 for non-small cell lung cancer. Last immunotherapy was on May 18, she had her second dose. Per her daughter at bedside since starting immunotherapy she has been feeling weak with nausea and vomiting intermittently.  She has tolerated chemotherapy and radiation without significant side effects per her daughter. -advanced diet -change meds to PO -home in AM if eating -may need phenergan suppositories at home  Hypovolemic hyponatremia -improved with IVF  Chronic abdominal pain Per daughter at bedside she takes tramadol daily as needed for abdominal pain Bowel regimen to avoid constipation.  Essential hypertension -hold BP meds due to low BP  Non-small cell lung carcinoma Follows with Dr. Earlie Server Last immunotherapy was on Oct 05, 2020  Generalized weakness/physical debility PT/OT- no follow up  Family Communication/Anticipated D/C date and plan/Code Status   Code Status: Full Code.  Family Communication: called daughter Disposition Plan: Status is: Inpatient  Remains inpatient appropriate because:Inpatient level of care appropriate due to severity of illness   Dispo: The patient is from: Home              Anticipated d/c is to: Home              Patient currently is not medically stable  to d/c.   Difficult to place patient No         Medical Consultants:    None.   Subjective:   Feeling better- no vomiting today  Objective:    Vitals:   10/21/20 0021 10/21/20 0024 10/21/20 0415 10/21/20 1254  BP: (!) 84/51 106/62 106/60 100/71  Pulse: 77 82 69 87  Resp: 16  16 16   Temp: 98.5 F (36.9 C)  98 F (36.7 C) 97.8 F (36.6 C)  TempSrc: Oral  Oral Oral  SpO2: 100%  100% 100%    Intake/Output Summary (Last 24 hours) at 10/21/2020 1344 Last data filed at 10/21/2020 1046 Gross per 24 hour  Intake 720 ml  Output 1000 ml  Net -280 ml   There were no vitals filed for this visit.  Exam:  General: Appearance:    elderly female in no acute distress     Lungs:     respirations unlabored  Heart:    Normal heart rate. Normal rhythm. No murmurs, rubs, or gallops.   MS:   All extremities are intact.   Neurologic:   Awake, alert, oriented x 3. No apparent focal neurological           defect.     Data Reviewed:   I have personally reviewed following labs and imaging studies:  Labs: Labs show the following:   Basic Metabolic Panel: Recent Labs  Lab 10/16/20 1956 10/19/20 1524 10/20/20 0532 10/21/20 0036  NA 134* 130* 133* 135  K 3.6 3.6 3.8 3.4*  CL 105 99  101 108  CO2 22 19* 20* 24  GLUCOSE 122* 117* 106* 90  BUN 12 13 13 9   CREATININE 0.70 0.92 0.76 0.69  CALCIUM 9.2 9.4 9.2 8.8*  MG  --   --  1.7  --   PHOS  --   --  3.9  --    GFR Estimated Creatinine Clearance: 50.5 mL/min (by C-G formula based on SCr of 0.69 mg/dL). Liver Function Tests: Recent Labs  Lab 10/16/20 1956 10/19/20 1524  AST 20 15  ALT 13 12  ALKPHOS 76 64  BILITOT 0.4 0.4  PROT 6.7 7.1  ALBUMIN 3.5 3.5   Recent Labs  Lab 10/16/20 1956 10/19/20 1524  LIPASE 24 22   No results for input(s): AMMONIA in the last 168 hours. Coagulation profile No results for input(s): INR, PROTIME in the last 168 hours.  CBC: Recent Labs  Lab 10/16/20 1956 10/19/20 1524  10/20/20 0532 10/21/20 0036  WBC 10.0 7.7 8.7 5.2  NEUTROABS  --  5.9  --   --   HGB 11.5* 13.4 12.5 10.9*  HCT 34.9* 39.5 37.4 31.2*  MCV 92.6 91.4 91.4 89.9  PLT 209 261 255 216   Cardiac Enzymes: No results for input(s): CKTOTAL, CKMB, CKMBINDEX, TROPONINI in the last 168 hours. BNP (last 3 results) No results for input(s): PROBNP in the last 8760 hours. CBG: No results for input(s): GLUCAP in the last 168 hours. D-Dimer: No results for input(s): DDIMER in the last 72 hours. Hgb A1c: No results for input(s): HGBA1C in the last 72 hours. Lipid Profile: No results for input(s): CHOL, HDL, LDLCALC, TRIG, CHOLHDL, LDLDIRECT in the last 72 hours. Thyroid function studies: Recent Labs    10/20/20 0915  TSH 1.179   Anemia work up: No results for input(s): VITAMINB12, FOLATE, FERRITIN, TIBC, IRON, RETICCTPCT in the last 72 hours. Sepsis Labs: Recent Labs  Lab 10/16/20 1956 10/19/20 1524 10/20/20 0532 10/21/20 0036  WBC 10.0 7.7 8.7 5.2    Microbiology Recent Results (from the past 240 hour(s))  Urine culture     Status: None   Collection Time: 10/11/20  7:49 PM   Specimen: Urine, Clean Catch  Result Value Ref Range Status   Specimen Description   Final    URINE, CLEAN CATCH Performed at [facility], [place] [address], [place], [place] [address]    Special Requests   Final    NONE Performed at [facility], [place] [address], [place], [place] [address]    Culture   Final    NO GROWTH Performed at [facility], [place] [address] [place], [place] [address]    Report Status 10/13/2020 FINAL  Final  SARS CORONAVIRUS 2 (TAT 6-24 HRS) Nasopharyngeal Nasopharyngeal Swab     Status: None   Collection Time: 10/19/20 11:50 PM   Specimen: Nasopharyngeal Swab  Result Value Ref Range Status   SARS Coronavirus 2 NEGATIVE NEGATIVE Final    Comment: (NOTE) SARS-CoV-2 target nucleic acids are NOT DETECTED.  The SARS-CoV-2 RNA is  generally detectable in upper and lower respiratory specimens during the acute phase of infection. Negative results do not preclude SARS-CoV-2 infection, do not rule out co-infections with other pathogens, and should not be used as the sole basis for treatment or other patient management decisions. Negative results must be combined with clinical observations, patient history, and epidemiological information. The expected result is Negative.  Fact Sheet for Patients: SugarRoll.be  Fact Sheet for Healthcare Providers: https://www.woods-mathews.com/  This test is not yet approved or cleared by the [place] and  has been authorized for detection and/or diagnosis of SARS-CoV-2 by FDA under an Emergency Use Authorization (EUA). This EUA will remain  in effect (meaning this test can be used) for the duration of the COVID-19 declaration under Se ction 564(b)(1) of the Act, 21 U.S.C. section 360bbb-3(b)(1), unless the authorization is terminated or revoked sooner.  Performed at [facility], [place] [address], [place], [place] [address]     Procedures and diagnostic studies:  DG Chest Portable 1 View  Result Date: 10/19/2020 CLINICAL DATA:  80 year old female with weakness. EXAM: PORTABLE CHEST 1 VIEW COMPARISON:  Chest radiograph dated 10/11/2020. FINDINGS: Right-sided Port-A-Cath tip over central SVC in similar position. Minimal left lung base atelectasis. No consolidative changes. There is no pleural effusion pneumothorax. The cardiac silhouette is within limits. No acute osseous pathology. Thyroidectomy surgical clips. IMPRESSION: No active cardiopulmonary disease. Electronically Signed   By: Anner Crete M.D.   On: 10/19/2020 16:01   DG Abd 2 Views  Result Date: 10/19/2020 CLINICAL DATA:  Abdominal pain and hypotension EXAM: ABDOMEN - 2 VIEW COMPARISON:  10/16/2020 CT FINDINGS: Scattered large and small bowel gas is noted. Some  prominence of the small bowel in the left mid abdomen is noted although somewhat decreased when compared with the prior exam. No free air is seen. No mass lesion is noted. Degenerative changes of lumbar spine are seen. IMPRESSION: Mildly prominent small bowel the left mid abdomen but appears lesser than that seen on prior examination. Repeat CT may be helpful for further evaluation. Electronically Signed   By: Inez Catalina M.D.   On: 10/19/2020 22:50    Medications:   . Chlorhexidine Gluconate Cloth  6 each Topical Daily  . mometasone-formoterol  2 puff Inhalation BID  . pravastatin  80 mg Oral Daily  . senna-docusate  1 tablet Oral QHS  . vitamin B-12  1,000 mcg Oral Daily   Continuous Infusions:   LOS: 1 day   Geradine Girt  [facility]   How to contact the [facility] Attending or Consulting provider [place] or covering provider during after hours [place], for this patient?  1. Check the care team in [facility] and look for a) attending/consulting [facility] provider listed and b) the [facility] team listed 2. Log into [email or web address] and use [place]'s universal password to access. If you do not have the password, please contact the hospital operator. 3. Locate the [facility] provider you are looking for under [facility] and page to a number that you can be directly reached. 4. If you still have difficulty reaching the provider, please page the [facility] (Director on Call) for the Hospitalists listed on amion for assistance.  10/21/2020, 1:44 PM

## 2020-10-21 NOTE — Progress Notes (Signed)
IV fluids discontinued per order. Potassium given for lab draw of 3.4; daughter called and updated. Order to monitor food intake acknowledged. NT notified.

## 2020-10-21 NOTE — Plan of Care (Signed)
  Problem: Education: Goal: Knowledge of General Education information will improve Description Including pain rating scale, medication(s)/side effects and non-pharmacologic comfort measures Outcome: Progressing   

## 2020-10-21 NOTE — Evaluation (Signed)
Physical Therapy Evaluation Patient Details Name: Angelica [name] MRN: [id number] DOB: [birth year] Today's Date: 10/21/2020   History of Present Illness  The pt is an 80 yo female presenting 6/1 with c/o nausea and vomiting and hypotension at home. Pt with recent ED visit on 5/29 for similar sx, improved with IV antiemetics, IV fluid and IV morphine and was d/c home. Pt admitted due to concern for dehydration. 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  Clinical Impression  Pt in bed upon arrival of PT, agreeable to evaluation at this time. Prior to admission the pt was independent with mobility in the home, able to complete mobility without AD and self-care such as cooking and bathing without assist. The pt now presents with minor limitations in functional mobility, activity tolerance, and dynamic stability due to above dx, but is safe to return home with family when medically cleared. The pt was able to complete multiple transfers in the room with no UE support but supervision for safety, and was able to complete 300 ft with first use of IV pole and then with no UE support. The pt reports she feels a little less stable than is her normal due to decreased OOB mobility since being admitted, will benefit from continued ambulation with supervision of daughter or staff outside of PT session, and potentially one more PT visit to establish walking program and HEP prior to anticipated return home. The pt and her daughter agreed to this plan, no further questions at this time.   5X Sit-to-Stand: 21 sec (> 14.8 sec indicates increased risk of falls for individuals aged 40-89, > 15 sec indicates increased risk of recurrent falls)     Follow Up Recommendations No PT follow up;Supervision for mobility/OOB    Equipment Recommendations  None recommended by PT    Recommendations for Other Services       Precautions / Restrictions Precautions Precautions:  Fall Precaution Comments: low fall Restrictions Weight Bearing Restrictions: No      Mobility  Bed Mobility Overal bed mobility: Modified Independent             General bed mobility comments: pt with use of elevated HOB but able to mobilize without assist or use of bed rails.    Transfers Overall transfer level: Needs assistance Equipment used: None Transfers: Sit to/from Stand Sit to Stand: Supervision         General transfer comment: supervision for safety, completed x6 through session  Ambulation/Gait Ambulation/Gait assistance: Min guard;Supervision Gait Distance (Feet): 300 Feet Assistive device: IV Pole;None Gait Pattern/deviations: Step-through pattern;Decreased stride length Gait velocity: 0.4 m/s Gait velocity interpretation: 1.31 - 2.62 ft/sec, indicative of limited community ambulator General Gait Details: pt with slowed but steady gait. no overt LOB despite slight increase in lateral movement. able to progress to no UE support, cues for posture      Balance Overall balance assessment: Mild deficits observed, not formally tested                                           Pertinent Vitals/Pain Pain Assessment: No/denies pain    Home Living Family/patient expects to be discharged to:: Private residence Living Arrangements: Children Available Help at Discharge: Family;Available PRN/intermittently Type of Home: House Home Access: Level entry     Home Layout: One level Home Equipment: Walker - 4 wheels;Bedside  commode      Prior Function Level of Independence: Independent         Comments: Daughter assists with meals, setting up meds, and community mobility.  Patient will participate with home management.     Hand Dominance   Dominant Hand: Right    Extremity/Trunk Assessment   Upper Extremity Assessment Upper Extremity Assessment: Defer to OT evaluation    Lower Extremity Assessment Lower Extremity Assessment:  Generalized weakness    Cervical / Trunk Assessment Cervical / Trunk Assessment: Kyphotic  Communication   Communication: No difficulties  Cognition Arousal/Alertness: Awake/alert Behavior During Therapy: WFL for tasks assessed/performed Overall Cognitive Status: Within Functional Limits for tasks assessed                                 General Comments: Slight inconsistencies between pt reported history and daughter, but pt with good safety awareness, recall of past therapy interventions, and need for mobility at home.      General Comments General comments (skin integrity, edema, etc.): VSS    Exercises Other Exercises Other Exercises: 5x sit-stand. 21 seconds, no UE support   Assessment/Plan    PT Assessment Patient needs continued PT services  PT Problem List Decreased strength;Decreased activity tolerance;Decreased balance;Decreased mobility       PT Treatment Interventions Gait training;Functional mobility training;Therapeutic activities;Therapeutic exercise;Balance training;Patient/family education    PT Goals (Current goals can be found in the Care Plan section)  Acute Rehab PT Goals Patient Stated Goal: to maintain strength and return home asap PT Goal Formulation: With patient Time For Goal Achievement: 11/04/20 Potential to Achieve Goals: Good    Frequency Min 3X/week    AM-PAC PT "6 Clicks" Mobility  Outcome Measure Help needed turning from your back to your side while in a flat bed without using bedrails?: None Help needed moving from lying on your back to sitting on the side of a flat bed without using bedrails?: None Help needed moving to and from a bed to a chair (including a wheelchair)?: A Little Help needed standing up from a chair using your arms (e.g., wheelchair or bedside chair)?: A Little Help needed to walk in hospital room?: A Little Help needed climbing 3-5 steps with a railing? : A Little 6 Click Score: 20    End of  Session Equipment Utilized During Treatment: Gait belt Activity Tolerance: Patient tolerated treatment well Patient left: in chair;with call bell/phone within reach;with family/visitor present Nurse Communication: Mobility status PT Visit Diagnosis: Other abnormalities of gait and mobility (R26.89);Muscle weakness (generalized) (M62.81)    Time: 8937-3428 PT Time Calculation (min) (ACUTE ONLY): 21 min   Charges:              Hardie Pulley, DPT   Acute Rehabilitation Department Pager #: [phone]  Otho Bellows 10/21/2020, 8:02 AM

## 2020-10-22 LAB — BASIC METABOLIC PANEL
Anion gap: 8 (ref 5–15)
BUN: 6 mg/dL — ABNORMAL LOW (ref 8–23)
CO2: 22 mmol/L (ref 22–32)
Calcium: 9 mg/dL (ref 8.9–10.3)
Chloride: 106 mmol/L (ref 98–111)
Creatinine, Ser: 0.78 mg/dL (ref 0.44–1.00)
GFR, Estimated: >60 mL/min (ref >60)
Glucose, Bld: 81 mg/dL (ref 70–99)
Potassium: 3.9 mmol/L (ref 3.5–5.1)
Sodium: 136 mmol/L (ref 135–145)

## 2020-10-22 LAB — CBC
HCT: 31.8 % — ABNORMAL LOW (ref 36.0–46.0)
Hemoglobin: 11 g/dL — ABNORMAL LOW (ref 12.0–15.0)
MCH: 31.3 pg (ref 26.0–34.0)
MCHC: 34.6 g/dL (ref 30.0–36.0)
MCV: 90.3 fL (ref 80.0–100.0)
Platelets: 225 10*3/uL (ref 150–400)
RBC: 3.52 MIL/uL — ABNORMAL LOW (ref 3.87–5.11)
RDW: 13.2 % (ref 11.5–15.5)
WBC: 5.9 10*3/uL (ref 4.0–10.5)
nRBC: 0 % (ref 0.0–0.2)

## 2020-10-22 MED ORDER — ACETAMINOPHEN 325 MG PO TABS: 325.0000 mg | ORAL_TABLET | Freq: Four times a day (QID) | ORAL | Status: DC | PRN

## 2020-10-22 MED ORDER — HEPARIN SOD (PORK) LOCK FLUSH 100 UNIT/ML IV SOLN
500.0000 [IU] | INTRAVENOUS | Status: AC | PRN
  Administered 2020-10-22: 500 [IU]
  Filled 2020-10-22: qty 5

## 2020-10-22 MED ORDER — SENNOSIDES-DOCUSATE SODIUM 8.6-50 MG PO TABS: 1.0000 | ORAL_TABLET | Freq: Every day | ORAL | 0 refills | Status: DC

## 2020-10-22 NOTE — Discharge Instructions (Signed)

## 2020-10-22 NOTE — Discharge Summary (Addendum)
Physician Discharge Summary  Angelica [name] VOH:[id number] DOB: Oct 05, 1940  PCP: [name], MD  Admitted from: Home Discharged to: Home  Admit date: 10/19/2020 Discharge date: 10/22/2020  Recommendations for Outpatient Follow-up:    Follow-up Information    [name], MD. Schedule an appointment as soon as possible for a visit in 1 week(s).   Specialties: Internal Medicine, Radiology Why: To be seen with repeat labs (CBC & BMP).  Reassess during this visit regarding starting back blood pressure medications. Contact information: [place] [address] [phone]        Curt Bears, MD Follow up on 11/02/2020.   Specialty: Oncology Why: Keep the prior appointment. Contact information: [place] [address] [phone]                Home Health: None    Equipment/Devices: None    Discharge Condition: Improved and stable   Code Status: Full Code Diet recommendation:  Discharge Diet Orders (From admission, onward)    Start     Ordered   10/22/20 0000  Diet - low sodium heart healthy        10/22/20 1234           Discharge Diagnoses:  Active Problems:   Intractable nausea and vomiting   N&V (nausea and vomiting)   Brief Summary: Angelica [name] is an 80 y.o. female with medical history significant fornon-small cell lung cancer on immunotherapy, followed by Dr. Earlie Server, medical oncology, essential hypertension,chronic abdominal pain, who presented to [facility] ED due to worsening nausea vomiting and inability to keep anything down x1 day.   HPI as per detailed H&P as copied below:  "Angelica [name] is a 80 y.o. 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 Earlie Server, medical oncology, essential hypertension, chronic abdominal pain, who presented to [facility] ED due to worsening nausea vomiting and inability to keep anything down x1 day.  Associated  with unintentional weight loss 5 to 7 pounds for the past 2 weeks.  History is mainly obtained by her daughter at bedside who is also her medical power of attorney.  Per her daughter at bedside onset of nausea and vomiting was on Sunday 4 days prior to her presentation.  Patient was taken to [facility] where she received IV antiemetics, IV fluid and IV morphine with improvement of her symptomatology.  She was discharged home.  On Monday and Tuesday patient was doing fine.  On Wednesday on the day of presentation she had sudden onset nausea and vomiting and could not keep anything down.  Her daughter brought her to the ED for further evaluation.  While at [facility] ED patient began vomiting despite IV Zofran.  Due to concern for dehydration EDP requested admission for IV fluid and IV antiemetics.  TRH, hospitalist team, was asked to admit.  ED Course:  Afebrile.  BP 145/82, pulse 96, respiration rate 24, O2 saturation 97% on room air."  Assessment and plan:  Intractable nausea and vomiting in the setting of recent immunotherapy on 10/05/2020 for non-small cell lung cancer. Last immunotherapy was on May 18, she had her second dose.  First dose was in mid April.  Indicates that the next dose is on April 15. Per her daughter's report, since starting immunotherapy she has been feeling weak with nausea and vomiting intermittently.She has tolerated prior chemotherapy and radiation without significant side effects per herdaughter. Treated supportively  with bowel rest/clear liquid diet, IV fluids, IV antiemetics, with which she gradually improved, diet was advanced which she has tolerated.  Patient reports that she has not had emesis since hospitalization.  On review of meds, it appears that she may have gotten 1 or 2 doses of IV antiemetics on the initial and next day of hospital admission and none since.  Patient and daughter have been advised to discuss with Dr. [name] during next visit  regarding her symptomatology following immunotherapy to see if any measures can be taken to minimize or avoid those symptoms.  They verbalized understanding.  Hypovolemic hyponatremia -Resolved with IV fluids.  Chronic abdominal pain Per daughter at bedside she takes tramadol daily as needed for abdominal pain Bowel regimen to avoid constipation.  Patient had BM this morning.  Essential hypertension Her blood pressures continues to be normal or even on the soft side despite holding all her antihypertensives.  Continue to hold antihypertensives at discharge until close outpatient follow-up at which time if the blood pressures are rising, may consider initiating one antihypertensive at a time and then titrating up.  Also noted that she is on 2 ARB's on her med rec, 1 of these needs to be discontinued indefinitely.  Defer management to PCP.  Non-small cell lung carcinoma Follows with Dr. Earlie Server Last immunotherapy was on May 18,2022.  Next dose is supposed to be on June 15.  Generalized weakness/physical debility PT/OT- no follow up recommended.  Hypokalemia:  Replaced  Consultations:  None  Procedures:  None   Discharge Instructions  Discharge Instructions    Call MD for:  difficulty breathing, headache or visual disturbances   Complete by: As directed    Call MD for:  extreme fatigue   Complete by: As directed    Call MD for:  persistant dizziness or light-headedness   Complete by: As directed    Call MD for:  persistant nausea and vomiting   Complete by: As directed    Call MD for:  severe uncontrolled pain   Complete by: As directed    Call MD for:  temperature >100.4   Complete by: As directed    Diet - low sodium heart healthy   Complete by: As directed    Increase activity slowly   Complete by: As directed        Medication List    STOP taking these medications   amLODipine 10 MG tablet Commonly known as: NORVASC   losartan 100 MG tablet Commonly  known as: COZAAR   telmisartan 80 MG tablet Commonly known as: Micardis     TAKE these medications   acetaminophen 325 MG tablet Commonly known as: TYLENOL Take 1 tablet (325 mg total) by mouth every 6 (six) hours as needed for headache or mild pain.   budesonide-formoterol 80-4.5 MCG/ACT inhaler Commonly known as: Symbicort Inhale 2 puffs into the lungs 2 (two) times daily. Use for asthma flares 2 puffs twice a day for 2 weeks or until cough and wheeze free What changed:   when to take this  reasons to take this  additional instructions   EPINEPHrine 0.3 mg/0.3 mL Soaj injection Commonly known as: EpiPen 2-Pak Use as directed for severe allergic reaction   Fasenra Pen 30 MG/ML Soaj Generic drug: Benralizumab Inject into the skin every 8 (eight) weeks.   ipratropium 0.06 % nasal spray Commonly known as: ATROVENT Place 2 sprays into both nostrils 3 (three) times daily.   linaclotide 72 MCG capsule Commonly known as:  Linzess TAKE 1 CAPSULE BY MOUTH ONCE DAILY AS NEEDED What changed:   how much to take  how to take this  when to take this  reasons to take this  additional instructions   loratadine 10 MG tablet Commonly known as: CLARITIN Take 10 mg by mouth daily.   montelukast 10 MG tablet Commonly known as: SINGULAIR Take 1 tablet (10 mg total) by mouth at bedtime. What changed:   when to take this  reasons to take this   PHILLIPS MILK OF MAGNESIA PO Take 15-30 mLs by mouth daily as needed (for constipation).   pravastatin 80 MG tablet Commonly known as: PRAVACHOL Take 1 tablet (80 mg total) by mouth daily.   prochlorperazine 10 MG tablet Commonly known as: COMPAZINE Take 1 tablet (10 mg total) by mouth every 6 (six) hours as needed for nausea or vomiting.   senna-docusate 8.6-50 MG tablet Commonly known as: Senokot-S Take 1 tablet by mouth at bedtime.   traMADol 50 MG tablet Commonly known as: ULTRAM Take 1 tablet (50 mg total) by mouth  every 6 (six) hours as needed for moderate pain.   vitamin B-12 1000 MCG tablet Commonly known as: CYANOCOBALAMIN Take 1,000 mcg by mouth daily.   Vitamin D3 50 MCG (2000 UT) Tabs Take 2,000 Units by mouth daily.      Allergies  Allergen Reactions  . Asa Buff (Mag [Buffered Aspirin] Other (See Comments)    Excessive sweating  . Ensure [Nutritional Supplements]     Or boost - upset stomach   . Fish Allergy Other (See Comments)    Unknown reaction, but allergic  . Other Other (See Comments)    Gelcaps; gel-containing capsules; extended-release medication - upset stomach   . Peanut-Containing Drug Products Other (See Comments)    From allergy test  . Penicillins Itching    Has patient had a PCN reaction causing immediate rash, facial/tongue/throat swelling, SOB or lightheadedness with hypotension: No Has patient had a PCN reaction causing severe rash involving mucus membranes or skin necrosis: No Has patient had a PCN reaction that required hospitalization No Has patient had a PCN reaction occurring within the last 10 years: No If all of the above answers are "NO", then may proceed with Cephalosporin use.  Marland Kitchen Shellfish-Derived Products Other (See Comments)    From allergy test  . Strawberry Extract Other (See Comments)    From allergy test       Procedures/Studies:  DG Chest Portable 1 View  Result Date: 10/19/2020 CLINICAL DATA:  80 year old female with weakness. EXAM: PORTABLE CHEST 1 VIEW COMPARISON:  Chest radiograph dated 10/11/2020. FINDINGS: Right-sided Port-A-Cath tip over central SVC in similar position. Minimal left lung base atelectasis. No consolidative changes. There is no pleural effusion pneumothorax. The cardiac silhouette is within limits. No acute osseous pathology. Thyroidectomy surgical clips. IMPRESSION: No active cardiopulmonary disease. Electronically Signed   By: Anner Crete M.D.   On: 10/19/2020 16:01   DG Abd 2 Views  Result Date:  10/19/2020 CLINICAL DATA:  Abdominal pain and hypotension EXAM: ABDOMEN - 2 VIEW COMPARISON:  10/16/2020 CT FINDINGS: Scattered large and small bowel gas is noted. Some prominence of the small bowel in the left mid abdomen is noted although somewhat decreased when compared with the prior exam. No free air is seen. No mass lesion is noted. Degenerative changes of lumbar spine are seen. IMPRESSION: Mildly prominent small bowel the left mid abdomen but appears lesser than that seen on prior examination. Repeat CT  may be helpful for further evaluation. Electronically Signed   By: Inez Catalina M.D.   On: 10/19/2020 22:50    Subjective: Patient interviewed and examined along with her female RN in the room.  Denies complaints and anxious to return home.  Tolerating diet.  Reports no vomiting since hospitalization.  No nausea, vomiting or abdominal pain.  Having BMs.  Able to ambulate by herself to bathroom.  Discharge Exam:  Vitals:   10/21/20 1254 10/21/20 2016 10/21/20 2110 10/22/20 0435  BP: 100/71 116/75  110/72  Pulse: 87 75 70 72  Resp: 16 17 16 15   Temp: 97.8 F (36.6 C) 98.5 F (36.9 C)  98.2 F (36.8 C)  TempSrc: Oral Oral  Oral  SpO2: 100% 100% 100% 100%    General: Pt lying comfortably in bed & appears in no obvious distress.  Oral mucosa moist. Cardiovascular: S1 & S2 heard, RRR, S1/S2 +. No murmurs, rubs, gallops or clicks. No JVD or pedal edema. Respiratory: Clear to auscultation without wheezing, rhonchi or crackles. No increased work of breathing. Abdominal:  Non distended, non tender & soft. No organomegaly or masses appreciated. Normal bowel sounds heard. CNS: Alert and oriented. No focal deficits. Extremities: no edema, no cyanosis    The results of significant diagnostics from this hospitalization (including imaging, microbiology, ancillary and laboratory) are listed below for reference.     Microbiology: Recent Results (from the past 240 hour(s))  SARS CORONAVIRUS 2  (TAT 6-24 HRS) Nasopharyngeal Nasopharyngeal Swab     Status: None   Collection Time: 10/19/20 11:50 PM   Specimen: Nasopharyngeal Swab  Result Value Ref Range Status   SARS Coronavirus 2 NEGATIVE NEGATIVE Final    Comment: (NOTE) SARS-CoV-2 target nucleic acids are NOT DETECTED.  The SARS-CoV-2 RNA is generally detectable in upper and lower respiratory specimens during the acute phase of infection. Negative results do not preclude SARS-CoV-2 infection, do not rule out co-infections with other pathogens, and should not be used as the sole basis for treatment or other patient management decisions. Negative results must be combined with clinical observations, patient history, and epidemiological information. The expected result is Negative.  Fact Sheet for Patients: SugarRoll.be  Fact Sheet for Healthcare Providers: https://www.woods-mathews.com/  This test is not yet approved or cleared by the [place] FDA and  has been authorized for detection and/or diagnosis of SARS-CoV-2 by FDA under an Emergency Use Authorization (EUA). This EUA will remain  in effect (meaning this test can be used) for the duration of the COVID-19 declaration under Se ction 564(b)(1) of the Act, 21 U.S.C. section 360bbb-3(b)(1), unless the authorization is terminated or revoked sooner.  Performed at [facility], [place] [address], [place], [place] [address]      Labs: CBC: Recent Labs  Lab 10/16/20 1956 10/19/20 1524 10/20/20 0532 10/21/20 0036 10/22/20 0121  WBC 10.0 7.7 8.7 5.2 5.9  NEUTROABS  --  5.9  --   --   --   HGB 11.5* 13.4 12.5 10.9* 11.0*  HCT 34.9* 39.5 37.4 31.2* 31.8*  MCV 92.6 91.4 91.4 89.9 90.3  PLT 209 261 255 216 604    Basic Metabolic Panel: Recent Labs  Lab 10/16/20 1956 10/19/20 1524 10/20/20 0532 10/21/20 0036 10/22/20 0121  NA 134* 130* 133* 135 136  K 3.6 3.6 3.8 3.4* 3.9  CL 105 99 101 108 106  CO2 22 19*  20* 24 22  GLUCOSE 122* 117* 106* 90 81  BUN 12 13 13  9 6*  CREATININE 0.70 0.92 0.76 0.69 0.78  CALCIUM 9.2 9.4 9.2 8.8* 9.0  MG  --   --  1.7  --   --   PHOS  --   --  3.9  --   --     Liver Function Tests: Recent Labs  Lab 10/16/20 1956 10/19/20 1524  AST 20 15  ALT 13 12  ALKPHOS 76 64  BILITOT 0.4 0.4  PROT 6.7 7.1  ALBUMIN 3.5 3.5     Thyroid function studies Recent Labs    10/20/20 0915  TSH 1.179    Urinalysis    Component Value Date/Time   COLORURINE YELLOW 10/20/2020 0810   APPEARANCEUR HAZY (A) 10/20/2020 0810   LABSPEC 1.014 10/20/2020 0810   PHURINE 5.0 10/20/2020 0810   GLUCOSEU NEGATIVE 10/20/2020 0810   GLUCOSEU NEGATIVE 05/03/2020 1707   HGBUR NEGATIVE 10/20/2020 0810   BILIRUBINUR NEGATIVE 10/20/2020 0810   KETONESUR 20 (A) 10/20/2020 0810   PROTEINUR 30 (A) 10/20/2020 0810   UROBILINOGEN 0.2 05/03/2020 1707   NITRITE NEGATIVE 10/20/2020 0810   LEUKOCYTESUR NEGATIVE 10/20/2020 0810   I discussed in detail with patient's daughter via patient's phone at bedside, updated care and answered all questions.  I also called her [facility], discussed with the pharmacist and canceled the Zofran prescription that had been sent previously by the ED physician   Time coordinating discharge: 25 minutes  SIGNED:  Vernell Leep, MD, FACP, [facility]. [facility]  To contact the attending provider between 7A-7P or the covering provider during after hours 7P-7A, please log into the web site [email or web address] and access using universal [place] password for that web site. If you do not have the password, please call the hospital operator.

## 2020-10-22 NOTE — Progress Notes (Signed)
Discharge teaching completed with patient. Patient verbalized understanding of the teaching.

## 2020-10-22 NOTE — Progress Notes (Signed)
Patient discharged to home with daughter via wheel chair with all belongings.

## 2020-10-25 ENCOUNTER — Other Ambulatory Visit: Payer: Self-pay

## 2020-10-25 ENCOUNTER — Ambulatory Visit (INDEPENDENT_AMBULATORY_CARE_PROVIDER_SITE_OTHER): Payer: Medicare HMO | Admitting: *Deleted

## 2020-10-25 ENCOUNTER — Telehealth: Payer: Self-pay

## 2020-10-25 DIAGNOSIS — C3492 Malignant neoplasm of unspecified part of left bronchus or lung: Secondary | ICD-10-CM | POA: Diagnosis not present

## 2020-10-25 DIAGNOSIS — M7552 Bursitis of left shoulder: Secondary | ICD-10-CM | POA: Diagnosis not present

## 2020-10-25 DIAGNOSIS — J4550 Severe persistent asthma, uncomplicated: Secondary | ICD-10-CM | POA: Diagnosis not present

## 2020-10-25 DIAGNOSIS — Z79891 Long term (current) use of opiate analgesic: Secondary | ICD-10-CM | POA: Diagnosis not present

## 2020-10-25 DIAGNOSIS — Z79899 Other long term (current) drug therapy: Secondary | ICD-10-CM | POA: Diagnosis not present

## 2020-10-25 DIAGNOSIS — M7592 Shoulder lesion, unspecified, left shoulder: Secondary | ICD-10-CM | POA: Diagnosis not present

## 2020-10-25 DIAGNOSIS — M19012 Primary osteoarthritis, left shoulder: Secondary | ICD-10-CM | POA: Diagnosis not present

## 2020-10-25 DIAGNOSIS — Z7951 Long term (current) use of inhaled steroids: Secondary | ICD-10-CM | POA: Diagnosis not present

## 2020-10-25 DIAGNOSIS — Z9181 History of falling: Secondary | ICD-10-CM | POA: Diagnosis not present

## 2020-10-25 DIAGNOSIS — M170 Bilateral primary osteoarthritis of knee: Secondary | ICD-10-CM | POA: Diagnosis not present

## 2020-10-25 NOTE — Telephone Encounter (Signed)
Transition Care Management Follow-up Telephone Call  Date of discharge and from where: 10/22/2020 from [facility]  How have you been since you were released from the hospital? Getting stronger, appetite getting better as well  Any questions or concerns? Yes, regarding blood pressure medication (should it be restarted)  Items Reviewed:  Did the pt receive and understand the discharge instructions provided? Yes   Medications obtained and verified? Yes   Other? No   Any new allergies since your discharge? No   Dietary orders reviewed? Yes, low sodium heart healthy diet  Do you have support at home? Yes , daughter  [place] and Equipment/Supplies: Were home health services ordered? Yes (previous order) If so, what is the name of the agency? [facility]  Has the agency set up a time to come to the patient's home? yes Were any new equipment or medical supplies ordered?  No What is the name of the medical supply agency? n/a Were you able to get the supplies/equipment? no Do you have any questions related to the use of the equipment or supplies? No  Functional Questionnaire: (I = Independent and D = Dependent) ADLs: I  Bathing/Dressing- I  Meal Prep- I  Eating- I  Maintaining continence- I  Transferring/Ambulation- I  Managing Meds- I  Follow up appointments reviewed:   PCP Hospital f/u appt confirmed? Yes  Scheduled to see Cathlean Cower, MD on 11/04/2020 @ 1:00pm.  [facility] f/u appt confirmed? Yes  Scheduled to see Curt Bears, MD on 11/02/2020 @ 8:30 am.  Are transportation arrangements needed? No   If their condition worsens, is the pt aware to call PCP or go to the Emergency Dept.? Yes  Was the patient provided with contact information for the PCP's office or ED? Yes  Was to pt encouraged to call back with questions or concerns? Yes

## 2020-11-01 ENCOUNTER — Ambulatory Visit: Payer: Medicare HMO | Admitting: Internal Medicine

## 2020-11-01 DIAGNOSIS — M7552 Bursitis of left shoulder: Secondary | ICD-10-CM | POA: Diagnosis not present

## 2020-11-01 DIAGNOSIS — Z7951 Long term (current) use of inhaled steroids: Secondary | ICD-10-CM | POA: Diagnosis not present

## 2020-11-01 DIAGNOSIS — M19012 Primary osteoarthritis, left shoulder: Secondary | ICD-10-CM | POA: Diagnosis not present

## 2020-11-01 DIAGNOSIS — M170 Bilateral primary osteoarthritis of knee: Secondary | ICD-10-CM | POA: Diagnosis not present

## 2020-11-01 DIAGNOSIS — Z9181 History of falling: Secondary | ICD-10-CM | POA: Diagnosis not present

## 2020-11-01 DIAGNOSIS — M7592 Shoulder lesion, unspecified, left shoulder: Secondary | ICD-10-CM | POA: Diagnosis not present

## 2020-11-01 DIAGNOSIS — C3492 Malignant neoplasm of unspecified part of left bronchus or lung: Secondary | ICD-10-CM | POA: Diagnosis not present

## 2020-11-01 DIAGNOSIS — Z79899 Other long term (current) drug therapy: Secondary | ICD-10-CM | POA: Diagnosis not present

## 2020-11-01 DIAGNOSIS — Z79891 Long term (current) use of opiate analgesic: Secondary | ICD-10-CM | POA: Diagnosis not present

## 2020-11-02 ENCOUNTER — Inpatient Hospital Stay: Payer: Medicare HMO

## 2020-11-02 ENCOUNTER — Inpatient Hospital Stay (HOSPITAL_COMMUNITY)
Admission: EM | Admit: 2020-11-02 | Discharge: 2020-11-08 | DRG: 872 | Disposition: A | Discharge status: Home / Self Care | Payer: Medicare HMO | Source: Ambulatory Visit | Attending: Family Medicine | Admitting: Family Medicine

## 2020-11-02 ENCOUNTER — Other Ambulatory Visit: Payer: Medicare HMO

## 2020-11-02 ENCOUNTER — Emergency Department (HOSPITAL_COMMUNITY): Payer: Medicare HMO

## 2020-11-02 ENCOUNTER — Other Ambulatory Visit: Payer: Self-pay

## 2020-11-02 ENCOUNTER — Encounter (HOSPITAL_COMMUNITY): Payer: Self-pay

## 2020-11-02 ENCOUNTER — Inpatient Hospital Stay: Payer: Medicare HMO | Attending: Internal Medicine | Admitting: Internal Medicine

## 2020-11-02 ENCOUNTER — Encounter: Payer: Self-pay | Admitting: Internal Medicine

## 2020-11-02 VITALS — BP 117/70 | HR 87 | Temp 97.0°F | Resp 19 | Ht 65.0 in | Wt 147.4 lb

## 2020-11-02 VITALS — BP 101/50 | HR 113 | Temp 103.1°F | Resp 20

## 2020-11-02 DIAGNOSIS — R6510 Systemic inflammatory response syndrome (SIRS) of non-infectious origin without acute organ dysfunction: Secondary | ICD-10-CM | POA: Diagnosis present

## 2020-11-02 DIAGNOSIS — Z95828 Presence of other vascular implants and grafts: Secondary | ICD-10-CM

## 2020-11-02 DIAGNOSIS — I10 Essential (primary) hypertension: Secondary | ICD-10-CM

## 2020-11-02 DIAGNOSIS — R509 Fever, unspecified: Secondary | ICD-10-CM

## 2020-11-02 DIAGNOSIS — K6389 Other specified diseases of intestine: Secondary | ICD-10-CM | POA: Diagnosis not present

## 2020-11-02 DIAGNOSIS — Z90710 Acquired absence of both cervix and uterus: Secondary | ICD-10-CM | POA: Diagnosis not present

## 2020-11-02 DIAGNOSIS — E785 Hyperlipidemia, unspecified: Secondary | ICD-10-CM | POA: Diagnosis present

## 2020-11-02 DIAGNOSIS — Z9221 Personal history of antineoplastic chemotherapy: Secondary | ICD-10-CM

## 2020-11-02 DIAGNOSIS — D8481 Immunodeficiency due to conditions classified elsewhere: Secondary | ICD-10-CM | POA: Diagnosis present

## 2020-11-02 DIAGNOSIS — K219 Gastro-esophageal reflux disease without esophagitis: Secondary | ICD-10-CM | POA: Diagnosis present

## 2020-11-02 DIAGNOSIS — G8929 Other chronic pain: Secondary | ICD-10-CM | POA: Diagnosis present

## 2020-11-02 DIAGNOSIS — Z923 Personal history of irradiation: Secondary | ICD-10-CM

## 2020-11-02 DIAGNOSIS — J9811 Atelectasis: Secondary | ICD-10-CM | POA: Diagnosis not present

## 2020-11-02 DIAGNOSIS — Z886 Allergy status to analgesic agent status: Secondary | ICD-10-CM | POA: Diagnosis not present

## 2020-11-02 DIAGNOSIS — Z79899 Other long term (current) drug therapy: Secondary | ICD-10-CM

## 2020-11-02 DIAGNOSIS — Z83511 Family history of glaucoma: Secondary | ICD-10-CM

## 2020-11-02 DIAGNOSIS — Z87891 Personal history of nicotine dependence: Secondary | ICD-10-CM | POA: Diagnosis not present

## 2020-11-02 DIAGNOSIS — R7881 Bacteremia: Secondary | ICD-10-CM | POA: Diagnosis present

## 2020-11-02 DIAGNOSIS — C3432 Malignant neoplasm of lower lobe, left bronchus or lung: Secondary | ICD-10-CM | POA: Diagnosis present

## 2020-11-02 DIAGNOSIS — C3492 Malignant neoplasm of unspecified part of left bronchus or lung: Secondary | ICD-10-CM

## 2020-11-02 DIAGNOSIS — C3490 Malignant neoplasm of unspecified part of unspecified bronchus or lung: Secondary | ICD-10-CM

## 2020-11-02 DIAGNOSIS — E049 Nontoxic goiter, unspecified: Secondary | ICD-10-CM | POA: Diagnosis present

## 2020-11-02 DIAGNOSIS — B999 Unspecified infectious disease: Secondary | ICD-10-CM | POA: Diagnosis present

## 2020-11-02 DIAGNOSIS — Z8249 Family history of ischemic heart disease and other diseases of the circulatory system: Secondary | ICD-10-CM | POA: Diagnosis not present

## 2020-11-02 DIAGNOSIS — A4159 Other Gram-negative sepsis: Secondary | ICD-10-CM | POA: Diagnosis present

## 2020-11-02 DIAGNOSIS — H409 Unspecified glaucoma: Secondary | ICD-10-CM | POA: Diagnosis present

## 2020-11-02 DIAGNOSIS — Z833 Family history of diabetes mellitus: Secondary | ICD-10-CM | POA: Diagnosis not present

## 2020-11-02 DIAGNOSIS — T380X5A Adverse effect of glucocorticoids and synthetic analogues, initial encounter: Secondary | ICD-10-CM | POA: Diagnosis present

## 2020-11-02 DIAGNOSIS — B961 Klebsiella pneumoniae [K. pneumoniae] as the cause of diseases classified elsewhere: Secondary | ICD-10-CM | POA: Diagnosis present

## 2020-11-02 DIAGNOSIS — M7989 Other specified soft tissue disorders: Secondary | ICD-10-CM | POA: Diagnosis not present

## 2020-11-02 DIAGNOSIS — J45909 Unspecified asthma, uncomplicated: Secondary | ICD-10-CM | POA: Diagnosis present

## 2020-11-02 DIAGNOSIS — K429 Umbilical hernia without obstruction or gangrene: Secondary | ICD-10-CM | POA: Diagnosis not present

## 2020-11-02 DIAGNOSIS — Z880 Allergy status to penicillin: Secondary | ICD-10-CM

## 2020-11-02 DIAGNOSIS — J90 Pleural effusion, not elsewhere classified: Secondary | ICD-10-CM | POA: Diagnosis not present

## 2020-11-02 DIAGNOSIS — Z5112 Encounter for antineoplastic immunotherapy: Secondary | ICD-10-CM

## 2020-11-02 DIAGNOSIS — R000 Tachycardia, unspecified: Secondary | ICD-10-CM | POA: Diagnosis not present

## 2020-11-02 DIAGNOSIS — Z7951 Long term (current) use of inhaled steroids: Secondary | ICD-10-CM

## 2020-11-02 DIAGNOSIS — M25512 Pain in left shoulder: Secondary | ICD-10-CM | POA: Diagnosis present

## 2020-11-02 DIAGNOSIS — Z20822 Contact with and (suspected) exposure to covid-19: Secondary | ICD-10-CM | POA: Diagnosis present

## 2020-11-02 DIAGNOSIS — K828 Other specified diseases of gallbladder: Secondary | ICD-10-CM | POA: Diagnosis not present

## 2020-11-02 DIAGNOSIS — Z85118 Personal history of other malignant neoplasm of bronchus and lung: Secondary | ICD-10-CM | POA: Diagnosis not present

## 2020-11-02 DIAGNOSIS — R7989 Other specified abnormal findings of blood chemistry: Secondary | ICD-10-CM | POA: Diagnosis not present

## 2020-11-02 LAB — CBC WITH DIFFERENTIAL (CANCER CENTER ONLY)
Abs Immature Granulocytes: 0.03 10*3/uL (ref 0.00–0.07)
Basophils Absolute: 0 10*3/uL (ref 0.0–0.1)
Basophils Relative: 0 %
Eosinophils Absolute: 0 10*3/uL (ref 0.0–0.5)
Eosinophils Relative: 0 %
HCT: 33.2 % — ABNORMAL LOW (ref 36.0–46.0)
Hemoglobin: 11.1 g/dL — ABNORMAL LOW (ref 12.0–15.0)
Immature Granulocytes: 0 %
Lymphocytes Relative: 13 %
Lymphs Abs: 1 10*3/uL (ref 0.7–4.0)
MCH: 30.2 pg (ref 26.0–34.0)
MCHC: 33.4 g/dL (ref 30.0–36.0)
MCV: 90.5 fL (ref 80.0–100.0)
Monocytes Absolute: 0.8 10*3/uL (ref 0.1–1.0)
Monocytes Relative: 11 %
Neutro Abs: 5.3 10*3/uL (ref 1.7–7.7)
Neutrophils Relative %: 76 %
Platelet Count: 220 10*3/uL (ref 150–400)
RBC: 3.67 MIL/uL — ABNORMAL LOW (ref 3.87–5.11)
RDW: 13 % (ref 11.5–15.5)
WBC Count: 7.1 10*3/uL (ref 4.0–10.5)
nRBC: 0 % (ref 0.0–0.2)

## 2020-11-02 LAB — COMPREHENSIVE METABOLIC PANEL
ALT: 11 U/L (ref 0–44)
AST: 19 U/L (ref 15–41)
Albumin: 3.1 g/dL — ABNORMAL LOW (ref 3.5–5.0)
Alkaline Phosphatase: 59 U/L (ref 38–126)
Anion gap: 6 (ref 5–15)
BUN: 12 mg/dL (ref 8–23)
CO2: 23 mmol/L (ref 22–32)
Calcium: 8.4 mg/dL — ABNORMAL LOW (ref 8.9–10.3)
Chloride: 106 mmol/L (ref 98–111)
Creatinine, Ser: 0.76 mg/dL (ref 0.44–1.00)
GFR, Estimated: >60 mL/min (ref >60)
Glucose, Bld: 125 mg/dL — ABNORMAL HIGH (ref 70–99)
Potassium: 3.6 mmol/L (ref 3.5–5.1)
Sodium: 135 mmol/L (ref 135–145)
Total Bilirubin: 0.4 mg/dL (ref 0.3–1.2)
Total Protein: 6.4 g/dL — ABNORMAL LOW (ref 6.5–8.1)

## 2020-11-02 LAB — CBC WITH DIFFERENTIAL/PLATELET
Abs Immature Granulocytes: 0.04 10*3/uL (ref 0.00–0.07)
Basophils Absolute: 0 10*3/uL (ref 0.0–0.1)
Basophils Relative: 0 %
Eosinophils Absolute: 0 10*3/uL (ref 0.0–0.5)
Eosinophils Relative: 0 %
HCT: 31.6 % — ABNORMAL LOW (ref 36.0–46.0)
Hemoglobin: 10.5 g/dL — ABNORMAL LOW (ref 12.0–15.0)
Immature Granulocytes: 0 %
Lymphocytes Relative: 1 %
Lymphs Abs: 0.2 10*3/uL — ABNORMAL LOW (ref 0.7–4.0)
MCH: 30.6 pg (ref 26.0–34.0)
MCHC: 33.2 g/dL (ref 30.0–36.0)
MCV: 92.1 fL (ref 80.0–100.0)
Monocytes Absolute: 0.2 10*3/uL (ref 0.1–1.0)
Monocytes Relative: 1 %
Neutro Abs: 12.6 10*3/uL — ABNORMAL HIGH (ref 1.7–7.7)
Neutrophils Relative %: 98 %
Platelets: 192 10*3/uL (ref 150–400)
RBC: 3.43 MIL/uL — ABNORMAL LOW (ref 3.87–5.11)
RDW: 13.2 % (ref 11.5–15.5)
WBC: 13 10*3/uL — ABNORMAL HIGH (ref 4.0–10.5)
nRBC: 0 % (ref 0.0–0.2)

## 2020-11-02 LAB — PROTIME-INR
INR: 1 (ref 0.8–1.2)
Prothrombin Time: 13.5 seconds (ref 11.4–15.2)

## 2020-11-02 LAB — CMP (CANCER CENTER ONLY)
ALT: 9 U/L (ref 0–44)
AST: 18 U/L (ref 15–41)
Albumin: 3.1 g/dL — ABNORMAL LOW (ref 3.5–5.0)
Alkaline Phosphatase: 70 U/L (ref 38–126)
Anion gap: 10 (ref 5–15)
BUN: 11 mg/dL (ref 8–23)
CO2: 22 mmol/L (ref 22–32)
Calcium: 9.3 mg/dL (ref 8.9–10.3)
Chloride: 104 mmol/L (ref 98–111)
Creatinine: 0.77 mg/dL (ref 0.44–1.00)
GFR, Estimated: >60 mL/min (ref >60)
Glucose, Bld: 94 mg/dL (ref 70–99)
Potassium: 4.2 mmol/L (ref 3.5–5.1)
Sodium: 136 mmol/L (ref 135–145)
Total Bilirubin: 0.3 mg/dL (ref 0.3–1.2)
Total Protein: 7.1 g/dL (ref 6.5–8.1)

## 2020-11-02 LAB — RESP PANEL BY RT-PCR (FLU A&B, COVID) ARPGX2
Influenza A by PCR: NEGATIVE
Influenza B by PCR: NEGATIVE
SARS Coronavirus 2 by RT PCR: NEGATIVE

## 2020-11-02 LAB — URINALYSIS, ROUTINE W REFLEX MICROSCOPIC
Bilirubin Urine: NEGATIVE
Glucose, UA: NEGATIVE mg/dL
Hgb urine dipstick: NEGATIVE
Ketones, ur: 5 mg/dL — AB
Leukocytes,Ua: NEGATIVE
Nitrite: NEGATIVE
Protein, ur: NEGATIVE mg/dL
Specific Gravity, Urine: 1.008 (ref 1.005–1.030)
pH: 6 (ref 5.0–8.0)

## 2020-11-02 LAB — TSH
TSH: 6.896 u[IU]/mL — ABNORMAL HIGH (ref 0.308–3.960)
TSH: 4.23 u[IU]/mL (ref 0.350–4.500)

## 2020-11-02 LAB — LACTIC ACID, PLASMA: Lactic Acid, Venous: 1.5 mmol/L (ref 0.5–1.9)

## 2020-11-02 LAB — APTT: aPTT: 22 seconds — ABNORMAL LOW (ref 24–36)

## 2020-11-02 LAB — MAGNESIUM: Magnesium: 1.6 mg/dL — ABNORMAL LOW (ref 1.7–2.4)

## 2020-11-02 IMAGING — DX DG CHEST 1V PORT
1 series · 1 of 1 positions shown · non-contrast
Comparison: [DATE]

CLINICAL DATA: Fever.

EXAM:
PORTABLE CHEST 1 VIEW

[chest ap]
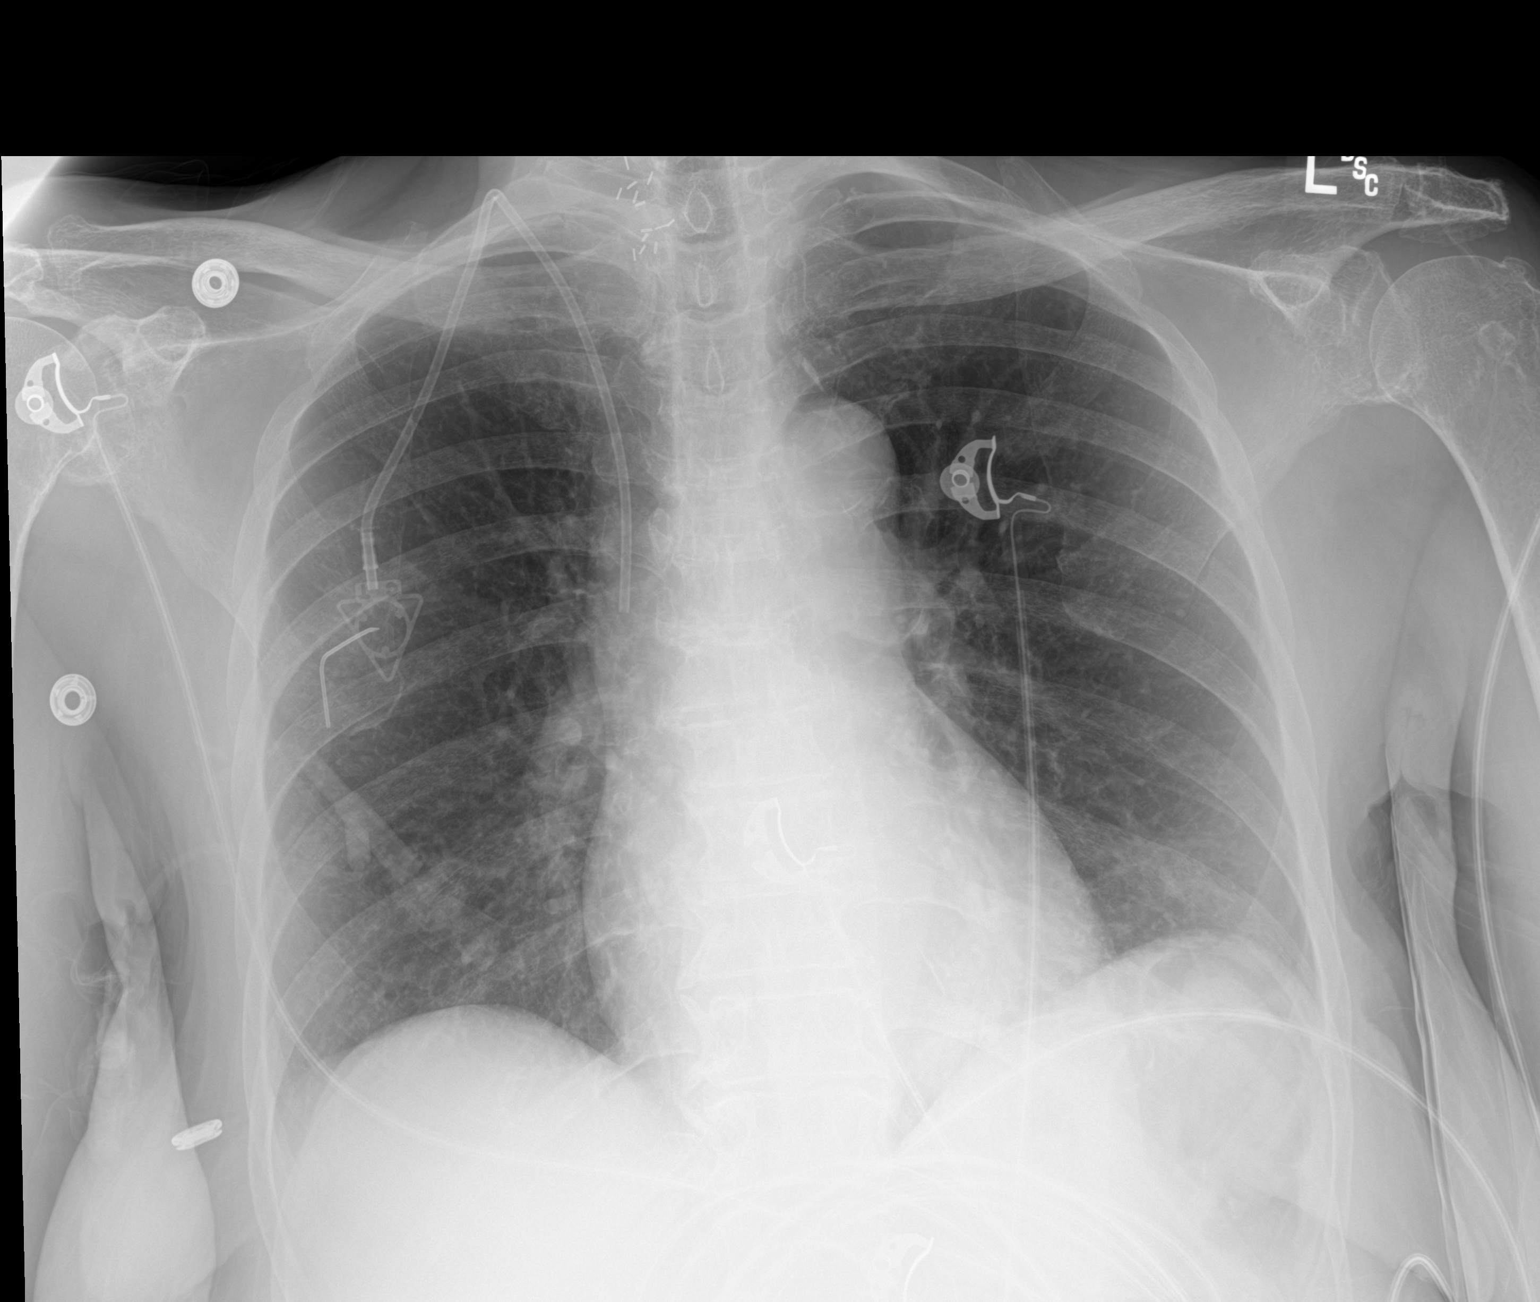

[1 of 1 positions shown; findings below may reference images not displayed]

FINDINGS: Port-A-Cath tip is in the superior vena cava. No pneumothorax. No
edema or airspace opacity. Heart size and pulmonary vascularity
normal. No adenopathy. There are surgical clips at the right
cervical-thoracic junction.
IMPRESSION: Port-A-Cath as described without pneumothorax. Lungs clear. Cardiac
silhouette normal. Aortic Atherosclerosis ([85]-[85]).

## 2020-11-02 MED ORDER — PRAVASTATIN SODIUM 20 MG PO TABS
80.0000 mg | ORAL_TABLET | Freq: Every day | ORAL | Status: DC
  Administered 2020-11-03 – 2020-11-08 (×6): 80 mg via ORAL
  Filled 2020-11-02 (×6): qty 4

## 2020-11-02 MED ORDER — MONTELUKAST SODIUM 10 MG PO TABS
10.0000 mg | ORAL_TABLET | Freq: Every day | ORAL | Status: DC
  Administered 2020-11-02 – 2020-11-07 (×5): 10 mg via ORAL
  Filled 2020-11-02 (×5): qty 1

## 2020-11-02 MED ORDER — ONDANSETRON HCL 4 MG/2ML IJ SOLN
4.0000 mg | Freq: Four times a day (QID) | INTRAMUSCULAR | Status: DC | PRN
  Administered 2020-11-04 – 2020-11-05 (×2): 4 mg via INTRAVENOUS
  Filled 2020-11-02 (×2): qty 2

## 2020-11-02 MED ORDER — METRONIDAZOLE 500 MG/100ML IV SOLN
500.0000 mg | Freq: Once | INTRAVENOUS | Status: AC
  Administered 2020-11-02: 500 mg via INTRAVENOUS
  Filled 2020-11-02: qty 100

## 2020-11-02 MED ORDER — VITAMIN D3 25 MCG (1000 UNIT) PO TABS
2000.0000 [IU] | ORAL_TABLET | Freq: Every day | ORAL | Status: DC
  Administered 2020-11-03 – 2020-11-08 (×6): 2000 [IU] via ORAL
  Filled 2020-11-02 (×6): qty 2

## 2020-11-02 MED ORDER — SODIUM CHLORIDE 0.9 % IV BOLUS
2250.0000 mL | Freq: Once | INTRAVENOUS | Status: AC
  Administered 2020-11-02: 2250 mL via INTRAVENOUS

## 2020-11-02 MED ORDER — LACTATED RINGERS IV SOLN
INTRAVENOUS | Status: DC
Start: 2020-11-02 — End: 2020-11-02

## 2020-11-02 MED ORDER — ACETAMINOPHEN 325 MG PO TABS
650.0000 mg | ORAL_TABLET | Freq: Once | ORAL | Status: AC
  Administered 2020-11-02: 650 mg via ORAL

## 2020-11-02 MED ORDER — ACETAMINOPHEN 325 MG PO TABS
325.0000 mg | ORAL_TABLET | Freq: Four times a day (QID) | ORAL | Status: DC | PRN
  Administered 2020-11-02 – 2020-11-08 (×4): 325 mg via ORAL
  Filled 2020-11-02 (×4): qty 1

## 2020-11-02 MED ORDER — SODIUM CHLORIDE 0.9 % IV SOLN
INTRAVENOUS | Status: DC
  Administered 2020-11-04: 75 mL via INTRAVENOUS

## 2020-11-02 MED ORDER — TRAMADOL HCL 50 MG PO TABS
50.0000 mg | ORAL_TABLET | Freq: Four times a day (QID) | ORAL | Status: DC | PRN
  Administered 2020-11-03 – 2020-11-06 (×2): 50 mg via ORAL
  Filled 2020-11-02 (×2): qty 1

## 2020-11-02 MED ORDER — ONDANSETRON HCL 4 MG PO TABS: 4.0000 mg | ORAL_TABLET | Freq: Four times a day (QID) | ORAL | Status: DC | PRN

## 2020-11-02 MED ORDER — LACTATED RINGERS IV BOLUS (SEPSIS): 250.0000 mL | Freq: Once | INTRAVENOUS | Status: DC

## 2020-11-02 MED ORDER — FAMOTIDINE 20 MG IN NS 100 ML IVPB
20.0000 mg | Freq: Once | INTRAVENOUS | Status: AC | PRN
  Administered 2020-11-02: 20 mg via INTRAVENOUS

## 2020-11-02 MED ORDER — DIPHENHYDRAMINE HCL 50 MG/ML IJ SOLN
INTRAMUSCULAR | Status: AC
  Filled 2020-11-02: qty 1

## 2020-11-02 MED ORDER — SODIUM CHLORIDE 0.9 % IV SOLN
Freq: Once | INTRAVENOUS | Status: DC | PRN
  Filled 2020-11-02: qty 250

## 2020-11-02 MED ORDER — LORATADINE 10 MG PO TABS
10.0000 mg | ORAL_TABLET | Freq: Every day | ORAL | Status: DC
  Administered 2020-11-03 – 2020-11-08 (×6): 10 mg via ORAL
  Filled 2020-11-02 (×6): qty 1

## 2020-11-02 MED ORDER — HEPARIN SOD (PORK) LOCK FLUSH 100 UNIT/ML IV SOLN
500.0000 [IU] | Freq: Once | INTRAVENOUS | Status: DC | PRN
  Filled 2020-11-02: qty 5

## 2020-11-02 MED ORDER — VITAMIN B-12 1000 MCG PO TABS: 1000.0000 ug | ORAL_TABLET | Freq: Every day | ORAL | Status: DC

## 2020-11-02 MED ORDER — DIPHENHYDRAMINE HCL 50 MG/ML IJ SOLN: 50.0000 mg | Freq: Once | INTRAMUSCULAR | Status: DC | PRN

## 2020-11-02 MED ORDER — VANCOMYCIN HCL IN DEXTROSE 1-5 GM/200ML-% IV SOLN: 1000.0000 mg | INTRAVENOUS | Status: DC

## 2020-11-02 MED ORDER — LACTATED RINGERS IV BOLUS (SEPSIS): 1000.0000 mL | Freq: Once | INTRAVENOUS | Status: DC

## 2020-11-02 MED ORDER — ACETAMINOPHEN 325 MG PO TABS
ORAL_TABLET | ORAL | Status: AC
  Filled 2020-11-02: qty 2

## 2020-11-02 MED ORDER — METHYLPREDNISOLONE SODIUM SUCC 125 MG IJ SOLR
125.0000 mg | Freq: Once | INTRAMUSCULAR | Status: AC | PRN
  Administered 2020-11-02: 125 mg via INTRAVENOUS

## 2020-11-02 MED ORDER — ACETAMINOPHEN 325 MG PO TABS
650.0000 mg | ORAL_TABLET | Freq: Once | ORAL | Status: AC
  Administered 2020-11-06: 650 mg via ORAL
  Filled 2020-11-02 (×2): qty 2

## 2020-11-02 MED ORDER — SODIUM CHLORIDE 0.9 % IV SOLN
1500.0000 mg | Freq: Once | INTRAVENOUS | Status: AC
  Administered 2020-11-02: 1500 mg via INTRAVENOUS
  Filled 2020-11-02: qty 30

## 2020-11-02 MED ORDER — ACETAMINOPHEN 325 MG PO TABS
650.0000 mg | ORAL_TABLET | Freq: Four times a day (QID) | ORAL | Status: DC | PRN
  Administered 2020-11-02: 650 mg via ORAL

## 2020-11-02 MED ORDER — LINACLOTIDE 72 MCG PO CAPS
72.0000 ug | ORAL_CAPSULE | Freq: Every day | ORAL | Status: DC | PRN
  Filled 2020-11-02: qty 1

## 2020-11-02 MED ORDER — SODIUM CHLORIDE 0.9 % IV SOLN
2.0000 g | Freq: Once | INTRAVENOUS | Status: AC
  Administered 2020-11-02: 2 g via INTRAVENOUS
  Filled 2020-11-02: qty 2

## 2020-11-02 MED ORDER — SODIUM CHLORIDE 0.9 % IV SOLN
2.0000 g | Freq: Two times a day (BID) | INTRAVENOUS | Status: DC
  Administered 2020-11-02: 2 g via INTRAVENOUS
  Filled 2020-11-02: qty 2

## 2020-11-02 MED ORDER — SODIUM CHLORIDE 0.9% FLUSH
10.0000 mL | INTRAVENOUS | Status: DC | PRN
  Administered 2020-11-02: 10 mL via INTRAVENOUS
  Filled 2020-11-02: qty 10

## 2020-11-02 MED ORDER — SODIUM CHLORIDE 0.9% FLUSH
10.0000 mL | INTRAVENOUS | Status: DC | PRN
  Filled 2020-11-02: qty 10

## 2020-11-02 MED ORDER — MOMETASONE FURO-FORMOTEROL FUM 100-5 MCG/ACT IN AERO
2.0000 | INHALATION_SPRAY | Freq: Two times a day (BID) | RESPIRATORY_TRACT | Status: DC
  Administered 2020-11-02 – 2020-11-08 (×12): 2 via RESPIRATORY_TRACT
  Filled 2020-11-02: qty 8.8

## 2020-11-02 MED ORDER — ENOXAPARIN SODIUM 40 MG/0.4ML IJ SOSY
40.0000 mg | PREFILLED_SYRINGE | Freq: Every day | INTRAMUSCULAR | Status: DC
  Administered 2020-11-02 – 2020-11-08 (×7): 40 mg via SUBCUTANEOUS
  Filled 2020-11-02 (×7): qty 0.4

## 2020-11-02 MED ORDER — SODIUM CHLORIDE 0.9 % IV SOLN
Freq: Once | INTRAVENOUS | Status: AC
Start: 2020-11-02 — End: 2020-11-02
  Filled 2020-11-02: qty 250

## 2020-11-02 MED ORDER — POLYETHYLENE GLYCOL 3350 17 G PO PACK: 17.0000 g | PACK | Freq: Every day | ORAL | Status: DC | PRN

## 2020-11-02 MED ORDER — LACTATED RINGERS IV BOLUS (SEPSIS)
1000.0000 mL | Freq: Once | INTRAVENOUS | Status: DC
Start: 2020-11-02 — End: 2020-11-02

## 2020-11-02 MED ORDER — ALUM & MAG HYDROXIDE-SIMETH 200-200-20 MG/5ML PO SUSP: 15.0000 mL | Freq: Four times a day (QID) | ORAL | Status: DC | PRN

## 2020-11-02 MED ORDER — VANCOMYCIN HCL 1250 MG/250ML IV SOLN
1250.0000 mg | Freq: Once | INTRAVENOUS | Status: AC
  Administered 2020-11-02: 1250 mg via INTRAVENOUS
  Filled 2020-11-02: qty 250

## 2020-11-02 MED ORDER — ONDANSETRON HCL 8 MG PO TABS: 8.0000 mg | ORAL_TABLET | Freq: Three times a day (TID) | ORAL | 0 refills | Status: DC | PRN

## 2020-11-02 MED ORDER — VANCOMYCIN HCL IN DEXTROSE 1-5 GM/200ML-% IV SOLN
1000.0000 mg | Freq: Once | INTRAVENOUS | Status: DC
  Filled 2020-11-02: qty 200

## 2020-11-02 NOTE — Progress Notes (Signed)
A consult was received from an ED physician for cefepime and vancomycin per pharmacy dosing.  The patient's profile has been reviewed for ht/wt/allergies/indication/available labs.   A one time order has been placed for cefepime 2 g IV and vancomycin 1250 mg IV.  Further antibiotics/pharmacy consults should be ordered by admitting physician if indicated.                       Thank you, Efraim Kaufmann, PharmD, BCPS 11/02/2020  1:32 PM

## 2020-11-02 NOTE — Progress Notes (Signed)
Angelica [name] [phone]   [phone]  OFFICE PROGRESS NOTE  Angelica Borg, MD Angelica [name] [id number]  DIAGNOSIS: Stage IIIB (T2 a, N3, M0) non-small cell lung cancer, adenocarcinoma presented with left lower lobe lung mass in addition to left hilar and mediastinal as well as right and left cervical lymphadenopathy diagnosed in January 2022  PRIOR THERAPY: Concurrent chemoradiation with weekly carboplatin for AUC of 2 and paclitaxel 45 mg/M2.  Status post 5 cycles.  Last dose was given August 01, 2020.  CURRENT THERAPY: Consolidation treatment with immunotherapy with Imfinzi 1500 mg IV every 4 weeks.  First dose September 06, 2020.  Status post 2 cycles.  INTERVAL HISTORY: Angelica [name] 80 y.o. female returns to the clinic today for follow-up visit accompanied by her daughter.  She is feeling much better today.  She was admitted to the hospital 10 days ago with intractable nausea and vomiting after she ate at [employer] several hours before.  She also had abdominal pain and treated with IV fluid as well as antiemetics with improvement of her symptoms.  During her evaluation she had CT scan of the abdomen and pelvis that showed no concerning acute findings and there was some further decrease in the size of her tumor.  The patient is feeling much better today with no significant nausea, vomiting, diarrhea or constipation.  She denied having any chest pain, shortness of breath, cough or hemoptysis.  She continues to tolerate her treatment with immunotherapy fairly well.  She is here today for evaluation before starting cycle #3.  MEDICAL HISTORY: Past Medical History:  Diagnosis Date   Arthritis    Asthma    Bowel obstruction (HCC)    Cancer (HCC)    Environmental allergies    GERD (gastroesophageal reflux disease)    Glaucoma 05/09/2017   H. pylori infection    History of radiation therapy 07/05/20-08/05/20   Left lung IMRT Dr. Sondra Come     HLD (hyperlipidemia) 01/16/2019   Hypertension    Iron deficiency anemia    Osteopenia 05/26/2017   Thyroid disease    Urticaria 07/25/2018    ALLERGIES:  is allergic to asa buff (mag [buffered aspirin], ensure [nutritional supplements], fish allergy, other, peanut-containing drug products, penicillins, shellfish-derived products, and strawberry extract.  MEDICATIONS:  Current Outpatient Medications  Medication Sig Dispense Refill   acetaminophen (TYLENOL) 325 MG tablet Take 1 tablet (325 mg total) by mouth every 6 (six) hours as needed for headache or mild pain.     Benralizumab (FASENRA PEN) 30 MG/ML SOAJ Inject into the skin every 8 (eight) weeks.     budesonide-formoterol (SYMBICORT) 80-4.5 MCG/ACT inhaler Inhale 2 puffs into the lungs 2 (two) times daily. Use for asthma flares 2 puffs twice a day for 2 weeks or until cough and wheeze free (Patient taking differently: Inhale 2 puffs into the lungs 2 (two) times daily as needed (for asthma flares, for 2 weeks, or until cough and wheeze-free).) 10.2 g 5   Cholecalciferol (VITAMIN D3) 50 MCG (2000 UT) TABS Take 2,000 Units by mouth daily.     Durvalumab (IMFINZI IV) Inject 1,500 mg into the vein every 28 (twenty-eight) days. Every 4 weeks     ipratropium (ATROVENT) 0.06 % nasal spray Place 2 sprays into both nostrils 3 (three) times daily. 15 mL 3   linaclotide (LINZESS) 72 MCG capsule TAKE 1 CAPSULE BY MOUTH ONCE DAILY AS NEEDED (Patient taking differently: Take 72  mcg by mouth daily as needed (constipation).) 30 capsule 0   loratadine (CLARITIN) 10 MG tablet Take 10 mg by mouth daily.     Magnesium Hydroxide (PHILLIPS MILK OF MAGNESIA PO) Take 15-30 mLs by mouth daily as needed (for constipation).     montelukast (SINGULAIR) 10 MG tablet Take 1 tablet (10 mg total) by mouth at bedtime. (Patient taking differently: Take 10 mg by mouth as needed.) 30 tablet 5   pravastatin (PRAVACHOL) 80 MG tablet Take 1 tablet (80 mg total) by mouth daily. 90  tablet 3   senna-docusate (SENOKOT-S) 8.6-50 MG tablet Take 1 tablet by mouth at bedtime. 30 tablet 0   vitamin B-12 (CYANOCOBALAMIN) 1000 MCG tablet Take 1,000 mcg by mouth daily.     EPINEPHrine (EPIPEN 2-PAK) 0.3 mg/0.3 mL IJ SOAJ injection Use as directed for severe allergic reaction (Patient not taking: Reported on 11/02/2020) 2 each 1   prochlorperazine (COMPAZINE) 10 MG tablet Take 1 tablet (10 mg total) by mouth every 6 (six) hours as needed for nausea or vomiting. (Patient not taking: Reported on 11/02/2020) 30 tablet 0   traMADol (ULTRAM) 50 MG tablet Take 1 tablet (50 mg total) by mouth every 6 (six) hours as needed for moderate pain. (Patient not taking: Reported on 11/02/2020) 30 tablet 2   Current Facility-Administered Medications  Medication Dose Route Frequency Provider Last Rate Last Admin   Benralizumab SOSY 30 mg  30 mg Subcutaneous Q8 Weeks Kennith Gain, MD   30 mg at 10/25/20 0102    SURGICAL HISTORY:  Past Surgical History:  Procedure Laterality Date   ABDOMINAL HYSTERECTOMY     BOWEL RESECTION N/A 05/23/2020   Procedure: SMALL BOWEL REPAIR;  Surgeon: Kieth Brightly Arta Bruce, MD;  Location: [place];  Service: General;  Laterality: N/A;   BREAST BIOPSY     BRONCHIAL BIOPSY  06/10/2020   Procedure: BRONCHIAL BIOPSIES;  Surgeon: Garner Nash, DO;  Location: [facility];  Service: Pulmonary;;   BRONCHIAL BRUSHINGS  06/10/2020   Procedure: BRONCHIAL BRUSHINGS;  Surgeon: Garner Nash, DO;  Location: [place];  Service: Pulmonary;;   BRONCHIAL NEEDLE ASPIRATION BIOPSY  06/10/2020   Procedure: BRONCHIAL NEEDLE ASPIRATION BIOPSIES;  Surgeon: Garner Nash, DO;  Location: [place] [facility] [facility];  Service: Pulmonary;;   BRONCHIAL WASHINGS  06/10/2020   Procedure: BRONCHIAL WASHINGS;  Surgeon: Garner Nash, DO;  Location: [place];  Service: Pulmonary;;   COLON SURGERY     DIAGNOSTIC LARYNGOSCOPY N/A 05/23/2020   Procedure: ATTEMPTED DIAGNOSTIC LAPAROSCOPY WITH  ADHESIONS;  Surgeon: Kieth Brightly, Arta Bruce, MD;  Location: [place];  Service: General;  Laterality: N/A;   IR IMAGING GUIDED PORT INSERTION  07/15/2020   KNEE SURGERY     LAPAROTOMY N/A 04/18/2015   Procedure: Exploratory laparotomy with lysis of adhesions, possible bowel resection;  Surgeon: Ralene Ok, MD;  Location: [place];  Service: General;  Laterality: N/A;   LAPAROTOMY N/A 05/23/2020   Procedure: EXPLORATORY LAPAROTOMY;  Surgeon: Mickeal Skinner, MD;  Location: [place];  Service: General;  Laterality: N/A;   LYSIS OF ADHESION N/A 05/23/2020   Procedure: LYSIS OF ADHESION;  Surgeon: Kieth Brightly Arta Bruce, MD;  Location: [place];  Service: General;  Laterality: N/A;   NASAL SINUS SURGERY     THYROID SURGERY     VIDEO BRONCHOSCOPY WITH ENDOBRONCHIAL NAVIGATION Bilateral 06/10/2020   Procedure: VIDEO BRONCHOSCOPY WITH ENDOBRONCHIAL NAVIGATION;  Surgeon: Garner Nash, DO;  Location: [place];  Service: Pulmonary;  Laterality: Bilateral;  VIDEO BRONCHOSCOPY WITH ENDOBRONCHIAL ULTRASOUND  06/10/2020   Procedure: VIDEO BRONCHOSCOPY WITH ENDOBRONCHIAL ULTRASOUND;  Surgeon: Garner Nash, DO;  Location: [facility];  Service: Pulmonary;;    REVIEW OF SYSTEMS:  A comprehensive review of systems was negative except for: Constitutional: positive for fatigue   PHYSICAL EXAMINATION: General appearance: alert, cooperative, and no distress Head: Normocephalic, without obvious abnormality, atraumatic Neck: no adenopathy, no JVD, supple, symmetrical, trachea midline, and thyroid not enlarged, symmetric, no tenderness/mass/nodules Lymph nodes: Cervical, supraclavicular, and axillary nodes normal. Resp: clear to auscultation bilaterally Back: symmetric, no curvature. ROM normal. No CVA tenderness. Cardio: regular rate and rhythm, S1, S2 normal, no murmur, click, rub or gallop GI: soft, non-tender; bowel sounds normal; no masses,  no organomegaly Extremities: extremities normal, atraumatic, no  cyanosis or edema  ECOG PERFORMANCE STATUS: 1 - Symptomatic but completely ambulatory  Blood pressure 117/70, pulse 87, temperature (!) 97 F (36.1 C), temperature source Tympanic, resp. rate 19, height 5\' 5"  (1.651 m), weight 147 lb 6.4 oz (66.9 kg), SpO2 100 %.  LABORATORY DATA: Lab Results  Component Value Date   WBC 7.1 11/02/2020   HGB 11.1 (L) 11/02/2020   HCT 33.2 (L) 11/02/2020   MCV 90.5 11/02/2020   PLT 220 11/02/2020      Chemistry      Component Value Date/Time   NA 136 11/02/2020 0818   K 4.2 11/02/2020 0818   CL 104 11/02/2020 0818   CO2 22 11/02/2020 0818   BUN 11 11/02/2020 0818   CREATININE 0.77 11/02/2020 0818      Component Value Date/Time   CALCIUM 9.3 11/02/2020 0818   ALKPHOS 70 11/02/2020 0818   AST 18 11/02/2020 0818   ALT 9 11/02/2020 0818   BILITOT 0.3 11/02/2020 0818       RADIOGRAPHIC STUDIES: CT ABDOMEN PELVIS WO CONTRAST  Result Date: 10/16/2020 CLINICAL DATA:  Mid abdominal pain and vomiting. History of non-small lung cancer. EXAM: CT ABDOMEN AND PELVIS WITHOUT CONTRAST TECHNIQUE: Multidetector CT imaging of the abdomen and pelvis was performed following the standard protocol without IV contrast. COMPARISON:  May 17, 2020 FINDINGS: Lower chest: Interval further decrease in the spiculated mass in the left lower lobe now measuring 1.4 x 1.6 cm. Atelectasis versus scarring in the left lower lobe. Hepatobiliary: Normal appearance of the liver. Tiny 2 mm gallstone in the dependent portion of the gallbladder. Pancreas: Atrophic pancreas. Spleen: Normal in size without focal abnormality. Adrenals/Urinary Tract: Adrenal glands are unremarkable. Kidneys are normal, without renal calculi, focal lesion, or hydronephrosis. Bladder is unremarkable. Stomach/Bowel: Stomach is within normal limits. No evidence of appendicitis. No evidence of bowel wall thickening, distention, or inflammatory changes. Vascular/Lymphatic: Aortic atherosclerosis. No enlarged  abdominal or pelvic lymph nodes. Reproductive: Status post hysterectomy. No adnexal masses. Other: No abdominal wall hernia or abnormality. No abdominopelvic ascites. Musculoskeletal: Spondylosis of the lumbosacral spine. IMPRESSION: 1. No evidence of acute abnormalities within the abdomen or pelvis. 2. Interval further decrease in the spiculated mass in the left lower lobe upper lung now measuring 1.4 x 1.6 cm. 3. Cholelithiasis without evidence of acute cholecystitis. 4. Aortic atherosclerosis. Aortic Atherosclerosis (ICD10-I70.0). Electronically Signed   By: Fidela Salisbury M.D.   On: 10/16/2020 19:43   CT Head Wo Contrast  Result Date: 10/11/2020 CLINICAL DATA:  Dizziness, lung cancer EXAM: CT HEAD WITHOUT CONTRAST TECHNIQUE: Contiguous axial images were obtained from the base of the skull through the vertex without intravenous contrast. COMPARISON:  None. FINDINGS: Brain: Normal anatomic configuration.  Parenchymal volume loss is commensurate with the patient's age. Mild periventricular white matter changes are present likely reflecting the sequela of small vessel ischemia. No abnormal intra or extra-axial mass lesion or fluid collection. No abnormal mass effect or midline shift. No evidence of acute intracranial hemorrhage or infarct. Ventricular size is normal. Cerebellum unremarkable. Vascular: No asymmetric hyperdense vasculature at the skull base. Skull: Intact Sinuses/Orbits: There is moderate mucosal thickening within the right frontal sinus, several ethmoid air cells and to a lesser degree within the right maxillary sinus. No air-fluid levels. Orbits are unremarkable. Other: Mastoid air cells and middle ear cavities are clear. IMPRESSION: No acute intracranial abnormality.  Mild senescent change. Moderate paranasal sinus disease. Electronically Signed   By: Fidela Salisbury MD   On: 10/11/2020 21:33   DG Chest Portable 1 View  Result Date: 10/19/2020 CLINICAL DATA:  80 year old female with  weakness. EXAM: PORTABLE CHEST 1 VIEW COMPARISON:  Chest radiograph dated 10/11/2020. FINDINGS: Right-sided Port-A-Cath tip over central SVC in similar position. Minimal left lung base atelectasis. No consolidative changes. There is no pleural effusion pneumothorax. The cardiac silhouette is within limits. No acute osseous pathology. Thyroidectomy surgical clips. IMPRESSION: No active cardiopulmonary disease. Electronically Signed   By: Anner Crete M.D.   On: 10/19/2020 16:01   DG Chest Port 1 View  Result Date: 10/11/2020 CLINICAL DATA:  80 year old female with weakness. EXAM: PORTABLE CHEST 1 VIEW COMPARISON:  Chest radiograph dated 06/10/2020 and CT dated 08/29/2020. FINDINGS: Right-sided Port-A-Cath with tip over central SVC. The lungs are clear. There is no pleural effusion pneumothorax. The cardiac silhouette is within limits. Atherosclerotic calcification of the aorta. No acute osseous pathology. IMPRESSION: No active cardiopulmonary disease. Electronically Signed   By: Anner Crete M.D.   On: 10/11/2020 20:29   DG Abd 2 Views  Result Date: 10/19/2020 CLINICAL DATA:  Abdominal pain and hypotension EXAM: ABDOMEN - 2 VIEW COMPARISON:  10/16/2020 CT FINDINGS: Scattered large and small bowel gas is noted. Some prominence of the small bowel in the left mid abdomen is noted although somewhat decreased when compared with the prior exam. No free air is seen. No mass lesion is noted. Degenerative changes of lumbar spine are seen. IMPRESSION: Mildly prominent small bowel the left mid abdomen but appears lesser than that seen on prior examination. Repeat CT may be helpful for further evaluation. Electronically Signed   By: Inez Catalina M.D.   On: 10/19/2020 22:50     ASSESSMENT AND PLAN: This is a very pleasant 80 years old African-American female recently diagnosed with a stage IIIb (T2a, N3, M0) non-small cell lung cancer, adenocarcinoma presented with left lower lobe lung mass in addition to left  hilar and mediastinal lymphadenopathy as well as right and left cervical lymphadenopathy diagnosed in January 2022. The patient underwent a course of concurrent chemoradiation with weekly carboplatin and paclitaxel status post 5 cycles with partial response.   The patient is currently on consolidation treatment with Imfinzi 1500 Mg IV every 4 weeks status post 2 cycles.   The patient continues to tolerate her treatment well with no concerning adverse effects except for 1 episode of nausea 10 days ago. I recommended for her to proceed with cycle #3 today as planned. I will see her back for follow-up visit in 4 weeks for evaluation with repeat CT scan of the chest for restaging of her disease. For the nausea, I will give her prescription for Zofran on as-needed basis. She was advised to call immediately if  she has any concerning symptoms in the interval. The patient voices understanding of current disease status and treatment options and is in agreement with the current care plan.  All questions were answered. The patient knows to call the clinic with any problems, questions or concerns. We can certainly see the patient much sooner if necessary.  Disclaimer: This note was dictated with voice recognition software. Similar sounding words can inadvertently be transcribed and may not be corrected upon review.

## 2020-11-02 NOTE — ED Notes (Signed)
Xray at the bedside.

## 2020-11-02 NOTE — ED Notes (Signed)
Hospitalist at the bedside 

## 2020-11-02 NOTE — ED Notes (Signed)
PA-C Sophia Caccavale notified of pt's immunocompromised status and VS.

## 2020-11-02 NOTE — ED Notes (Signed)
Per rapid response-patient spike a fever after receiving treatment today-sending to ED for eval

## 2020-11-02 NOTE — ED Notes (Signed)
Provider notified of pt's rectal temp of 102.63F. No additional orders at this time.

## 2020-11-02 NOTE — ED Provider Notes (Signed)
[facility] DEPT Provider Note   CSN: [id number] Arrival date & time: 11/02/20  1151     History Chief Complaint  Patient presents with   Fever    Angelica [name] is a 80 y.o. female presenting for evaluation of fever.   Level V caveat due to confusion. Per daughter, pt with h/o memory problems.   Pt states she is feeling well and has no complaints. She denies recent cough, chest pain, shortness of breath, nausea, vomiting, Donnell pain, urinary symptoms, abnormal bowel movements.  Per triage note, patient was receiving immunotherapy for lung cancer when she suddenly spiked a fever of 103.  She received Benadryl, Pepcid, Tylenol, and Solu-Medrol and was sent to the ER.   Patient denies any shortness of breath, nausea, vomiting, rash, itching, or confusion with the onset of fever     HPI     Past Medical History:  Diagnosis Date   Arthritis    Asthma    Bowel obstruction (HCC)    Cancer (HCC)    Environmental allergies    GERD (gastroesophageal reflux disease)    Glaucoma 05/09/2017   H. pylori infection    History of radiation therapy 07/05/20-08/05/20   Left lung IMRT Dr. Sondra Come    HLD (hyperlipidemia) 01/16/2019   Hypertension    Iron deficiency anemia    Osteopenia 05/26/2017   Thyroid disease    Urticaria 07/25/2018    Patient Active Problem List   Diagnosis Date Noted   Port-A-Cath in place 11/02/2020   N&V (nausea and vomiting) 10/20/2020   Intractable nausea and vomiting 10/19/2020   Encounter for antineoplastic immunotherapy 08/31/2020   Adenocarcinoma of left lung, stage 3 ([place]) 06/22/2020   Encounter for antineoplastic chemotherapy 06/22/2020   Lung mass    Adenopathy 06/09/2020   Epigastric abdominal tenderness with rebound tenderness 05/17/2020   Pulmonary nodule 05/17/2020   Iron deficiency 05/03/2020   Dizziness 01/19/2020   Left rotator cuff tear 11/12/2019   Trigger thumb of right hand 11/12/2019   Trigger  finger, left ring finger 11/12/2019   Left shoulder pain 09/29/2019   Swelling of lower extremity 07/30/2019   Greater trochanteric bursitis of left hip 04/07/2019   Right ankle swelling 02/18/2019   Ganglion cyst of right foot 01/16/2019   HLD (hyperlipidemia) 01/16/2019   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 pylori infection    Other allergic rhinitis 03/20/2016   Cough, persistent 03/20/2016   Hypoglycemia 04/17/2015   Small bowel obstruction ([place])    Malnutrition of moderate degree 04/11/2015   SBO (small bowel obstruction) ([place]) 04/10/2015   HTN (hypertension) 04/10/2015    Past Surgical History:  Procedure Laterality Date   ABDOMINAL HYSTERECTOMY     BOWEL RESECTION N/A 05/23/2020   Procedure: SMALL BOWEL REPAIR;  Surgeon: Kieth Brightly, Arta Bruce, MD;  Location: [facility];  Service: General;  Laterality: N/A;   BREAST BIOPSY     BRONCHIAL BIOPSY  06/10/2020   Procedure: BRONCHIAL BIOPSIES;  Surgeon: Garner Nash, DO;  Location: [facility];  Service: Pulmonary;;  BRONCHIAL BRUSHINGS  06/10/2020   Procedure: BRONCHIAL BRUSHINGS;  Surgeon: Garner Nash, DO;  Location: [facility];  Service: Pulmonary;;   BRONCHIAL NEEDLE ASPIRATION BIOPSY  06/10/2020   Procedure: BRONCHIAL NEEDLE ASPIRATION BIOPSIES;  Surgeon: Garner Nash, DO;  Location: [facility];  Service: Pulmonary;;   BRONCHIAL WASHINGS  06/10/2020   Procedure: BRONCHIAL WASHINGS;  Surgeon: Garner Nash, DO;  Location: [facility];  Service:  Pulmonary;;   COLON SURGERY     DIAGNOSTIC LARYNGOSCOPY N/A 05/23/2020   Procedure: ATTEMPTED DIAGNOSTIC LAPAROSCOPY WITH ADHESIONS;  Surgeon: Kieth Brightly, Arta Bruce, MD;  Location: [facility];  Service: General;  Laterality: N/A;   IR IMAGING GUIDED PORT INSERTION  07/15/2020   KNEE SURGERY     LAPAROTOMY N/A 04/18/2015   Procedure: Exploratory laparotomy with lysis of adhesions, possible bowel resection;  Surgeon: Ralene Ok, MD;  Location: [facility];  Service: General;  Laterality: N/A;   LAPAROTOMY N/A 05/23/2020   Procedure: EXPLORATORY LAPAROTOMY;  Surgeon: Mickeal Skinner, MD;  Location: [facility];  Service: General;  Laterality: N/A;   LYSIS OF ADHESION N/A 05/23/2020   Procedure: LYSIS OF ADHESION;  Surgeon: Kieth Brightly Arta Bruce, MD;  Location: [facility];  Service: General;  Laterality: N/A;   NASAL SINUS SURGERY     THYROID SURGERY     VIDEO BRONCHOSCOPY WITH ENDOBRONCHIAL NAVIGATION Bilateral 06/10/2020   Procedure: VIDEO BRONCHOSCOPY WITH ENDOBRONCHIAL NAVIGATION;  Surgeon: Garner Nash, DO;  Location: [facility];  Service: Pulmonary;  Laterality: Bilateral;   VIDEO BRONCHOSCOPY WITH ENDOBRONCHIAL ULTRASOUND  06/10/2020   Procedure: VIDEO BRONCHOSCOPY WITH ENDOBRONCHIAL ULTRASOUND;  Surgeon: Garner Nash, DO;  Location: [facility];  Service: Pulmonary;;     OB History   No obstetric history on file.     Family History  Problem Relation Age of Onset   Diabetes Mother    Hypertension Mother    Hypertension Father    Glaucoma Sister    Glaucoma Brother    Allergic rhinitis Neg Hx    Angioedema Neg Hx    Asthma Neg Hx    Eczema Neg Hx    Immunodeficiency Neg Hx    Urticaria Neg Hx     Social History   Tobacco Use   Smoking status: Former    Pack years: 0.00    Types: Cigarettes   Smokeless tobacco: Never   Tobacco comments:    quit 60 years ago  Vaping Use   Vaping Use: Never used  Substance Use Topics   Alcohol use: No   Drug use: No    Home  Medications Prior to Admission medications   Medication Sig Start Date End Date Taking? Authorizing Provider  acetaminophen (TYLENOL) 325 MG tablet Take 1 tablet (325 mg total) by mouth every 6 (six) hours as needed for headache or mild pain. 10/22/20   Hongalgi, Lenis Dickinson, MD  Benralizumab (FASENRA PEN) 30 MG/ML SOAJ Inject into the skin every 8 (eight) weeks.    [provider]  budesonide-formoterol (SYMBICORT) 80-4.5 MCG/ACT inhaler Inhale 2 puffs into the lungs 2 (two) times daily. Use for asthma flares 2 puffs twice a day for 2 weeks or until cough and wheeze free Patient taking differently: Inhale 2 puffs into the lungs 2 (two) times daily as needed (for asthma flares, for 2 weeks, or until cough and wheeze-free). 01/28/20   Kennith Gain, MD  Cholecalciferol (VITAMIN D3) 50 MCG (2000 UT) TABS Take 2,000 Units by mouth daily.    [provider]  Durvalumab (IMFINZI IV) Inject 1,500 mg into the vein every 28 (twenty-eight) days. Every 4 weeks    Curt Bears, MD  EPINEPHrine (EPIPEN 2-PAK) 0.3 mg/0.3 mL IJ SOAJ injection Use as directed for severe allergic reaction Patient not taking: Reported on 11/02/2020 01/28/20   Kennith Gain, MD  ipratropium (ATROVENT) 0.06 % nasal spray Place 2 sprays into both nostrils 3 (three) times daily. 07/28/20   Kennith Gain, MD  linaclotide (LINZESS) 72 MCG capsule TAKE 1 CAPSULE BY MOUTH ONCE DAILY AS NEEDED Patient taking differently: Take 72 mcg by mouth daily as needed (constipation). 06/14/20   [name], MD  loratadine (CLARITIN) 10 MG tablet Take 10 mg by mouth daily.    [provider]  Magnesium Hydroxide (PHILLIPS MILK OF MAGNESIA PO) Take 15-30 mLs by mouth daily as needed (for constipation).    [provider]  montelukast (SINGULAIR) 10 MG tablet Take 1 tablet (10 mg total) by mouth at bedtime. Patient taking differently: Take 10 mg by mouth as needed. 08/15/20   Kennith Gain, MD  ondansetron (ZOFRAN) 8 MG tablet Take 1 tablet (8 mg total) by mouth every 8 (eight) hours as needed for nausea or vomiting. 11/02/20   Curt Bears, MD  pravastatin (PRAVACHOL) 80 MG tablet Take 1 tablet (80 mg total) by mouth daily. 06/14/20   [name], MD  prochlorperazine (COMPAZINE) 10 MG tablet Take 1 tablet (10 mg total) by mouth every 6 (six) hours as needed for nausea or vomiting. Patient not taking: Reported on 11/02/2020 06/22/20   Curt Bears, MD  senna-docusate (SENOKOT-S) 8.6-50 MG tablet Take 1 tablet by mouth at bedtime. 10/22/20   Hongalgi, Lenis Dickinson, MD  traMADol (ULTRAM) 50 MG tablet Take 1 tablet (50 mg total) by mouth every 6 (six) hours as needed for moderate pain. Patient not taking: Reported on 11/02/2020 08/01/20   [name], MD  vitamin B-12 (CYANOCOBALAMIN) 1000 MCG tablet Take 1,000 mcg by mouth daily.    [provider]    Allergies    Asa buff (mag [buffered aspirin], Ensure [nutritional supplements], Fish allergy, Other, Peanut-containing drug products, Penicillins, Shellfish-derived products, and Strawberry extract  Review of Systems   Review of Systems  Constitutional:  Positive for fever.  Allergic/Immunologic: Positive for immunocompromised state.  All other systems reviewed and are negative.  Physical Exam Updated Vital Signs BP 111/60 (BP Location: Left Arm)   Pulse 100   Temp (!) 102.9 F (39.4 C) (Rectal)   Ht 5\' 5"  (1.651 m)   Wt 66.9 kg   SpO2 100%   BMI 24.54 kg/m   Physical Exam Vitals and nursing note reviewed.  Constitutional:      General: She is not in acute distress.    Appearance: Normal appearance. She is ill-appearing (appears chronically ill).  HENT:     Head: Normocephalic and atraumatic.  Eyes:     Conjunctiva/sclera: Conjunctivae normal.     Pupils: Pupils are equal, round, and reactive to light.  Cardiovascular:     Rate and Rhythm: Normal rate and regular rhythm.     Pulses: Normal  pulses.  Pulmonary:     Effort: Pulmonary effort is normal. No respiratory distress.     Breath sounds: Normal breath sounds. No wheezing.     Comments: Speaking in full sentences.  Clear lung sounds in all fields. Abdominal:     General: There is no distension.     Palpations: Abdomen  is soft. There is no mass.     Tenderness: There is no abdominal tenderness. There is no guarding or rebound.  Musculoskeletal:        General: Normal range of motion.     Cervical back: Normal range of motion and neck supple.  Skin:    General: Skin is warm and dry.     Capillary Refill: Capillary refill takes less than 2 seconds.  Neurological:     Mental Status: She is alert.     Comments: Oriented to baseline per daughter  Psychiatric:        Mood and Affect: Mood and affect normal.        Speech: Speech normal.        Behavior: Behavior normal.    ED Results / Procedures / Treatments   Labs (all labs ordered are listed, but only abnormal results are displayed) Labs Reviewed  URINE CULTURE  CULTURE, BLOOD (ROUTINE X 2)  CULTURE, BLOOD (ROUTINE X 2)  RESP PANEL BY RT-PCR (FLU A&B, COVID) ARPGX2  LACTIC ACID, PLASMA  LACTIC ACID, PLASMA  COMPREHENSIVE METABOLIC PANEL  CBC WITH DIFFERENTIAL/PLATELET  PROTIME-INR  APTT  URINALYSIS, ROUTINE W REFLEX MICROSCOPIC    EKG None  Radiology No results found.  Procedures .Critical Care  Date/Time: 11/02/2020 2:27 PM Performed by: Franchot Heidelberg, PA-C Authorized by: Franchot Heidelberg, PA-C   Critical care provider statement:    Critical care time (minutes):  40   Critical care time was exclusive of:  Separately billable procedures and treating other patients and teaching time   Critical care was necessary to treat or prevent imminent or life-threatening deterioration of the following conditions:  Sepsis   Critical care was time spent personally by me on the following activities:  Blood draw for specimens, development of treatment plan  with patient or surrogate, discussions with consultants, examination of patient, evaluation of patient's response to treatment, obtaining history from patient or surrogate, ordering and performing treatments and interventions, ordering and review of laboratory studies, ordering and review of radiographic studies, pulse oximetry, re-evaluation of patient's condition and review of old charts   I assumed direction of critical care for this patient from another provider in my specialty: no     Care discussed with: admitting provider   Comments:     Pt with infusion rxn vs sepsis. Fluids and antibiotics started. Pt to be admitted.    Medications Ordered in ED Medications - No data to display  ED Course  I have reviewed the triage vital signs and the nursing notes.  Pertinent labs & imaging results that were available during my care of the patient were reviewed by me and considered in my medical decision making (see chart for details).    MDM Rules/Calculators/A&P                          Patient presenting to the ER for evaluation of fever and likely infusion reaction.  On exam, patient appears chronically ill, but not in acute distress today.  She continues to be febrile.  No other signs of anaphylaxis or reaction.  Consider infectious cause versus infusion reaction.  Will obtain labs and continue to monitor.  Labs show mild leukocytosis of 13, which may be reactive to the Benadryl but also may be due to infection.  Patient is mildly tachycardic and febrile, as such she meets for his criteria and antibiotics are started.  However in the setting of no  other obvious source, I still have a high suspicion for infusion reaction.  Otherwise, labs are reassuring.  Chest x-ray viewed and independently interpreted by me, no pneumonia pneumothorax, effusion.  Will consult with oncology.  Discussed with Dr. Earlie Server, patient's oncologist who agrees that this was likely an infusion reaction, however recommends  observation for 24 hours to ensure improvement and no signs of infection.  Discussed with Dr. Doristine Bosworth from [facility] hospitalist service, patient to be admitted.  Final Clinical Impression(s) / ED Diagnoses Final diagnoses:  Fever, unspecified fever cause    Rx / DC Orders ED Discharge Orders     None        Franchot Heidelberg, PA-C 11/02/20 1428    Milton Ferguson, MD 11/04/20 [phone]

## 2020-11-02 NOTE — Patient Instructions (Signed)
[facility]  Discharge Instructions: Thank you for choosing [facility] to provide your oncology and hematology care.   If you have a lab appointment with the Cancer Center, please go directly to the Cancer Center and check in at the registration area.   Wear comfortable clothing and clothing appropriate for easy access to any Portacath or PICC line.   We strive to give you quality time with your provider. You may need to reschedule your appointment if you arrive late (15 or more minutes).  Arriving late affects you and other patients whose appointments are after yours.  Also, if you miss three or more appointments without notifying the office, you may be dismissed from the clinic at the provider's discretion.      For prescription refill requests, have your pharmacy contact our office and allow 72 hours for refills to be completed.    Today you received the following chemotherapy and/or immunotherapy agents imfinzi       To help prevent nausea and vomiting after your treatment, we encourage you to take your nausea medication as directed.  BELOW ARE SYMPTOMS THAT SHOULD BE REPORTED IMMEDIATELY: *FEVER GREATER THAN 100.4 F (38 C) OR HIGHER *CHILLS OR SWEATING *NAUSEA AND VOMITING THAT IS NOT CONTROLLED WITH YOUR NAUSEA MEDICATION *UNUSUAL SHORTNESS OF BREATH *UNUSUAL BRUISING OR BLEEDING *URINARY PROBLEMS (pain or burning when urinating, or frequent urination) *BOWEL PROBLEMS (unusual diarrhea, constipation, pain near the anus) TENDERNESS IN MOUTH AND THROAT WITH OR WITHOUT PRESENCE OF ULCERS (sore throat, sores in mouth, or a toothache) UNUSUAL RASH, SWELLING OR PAIN  UNUSUAL VAGINAL DISCHARGE OR ITCHING   Items with * indicate a potential emergency and should be followed up as soon as possible or go to the Emergency Department if any problems should occur.  Please show the CHEMOTHERAPY ALERT CARD or IMMUNOTHERAPY ALERT CARD at check-in to  the Emergency Department and triage nurse.  Should you have questions after your visit or need to cancel or reschedule your appointment, please contact [facility]  Dept: [phone]  and follow the prompts.  Office hours are 8:00 a.m. to 4:30 p.m. Monday - Friday. Please note that voicemails left after 4:00 p.m. may not be returned until the following business day.  We are closed weekends and major holidays. You have access to a nurse at all times for urgent questions. Please call the main number to the clinic Dept: [phone] and follow the prompts.   For any non-urgent questions, you may also contact your provider using MyChart. We now offer e-Visits for anyone 18 and older to request care online for non-urgent symptoms. For details visit [email or web address].   Also download the MyChart app! Go to the app store, search "MyChart", open the app, select , and log in with your MyChart username and password.  Due to Covid, a mask is required upon entering the hospital/clinic. If you do not have a mask, one will be given to you upon arrival. For doctor visits, patients may have 1 support person aged 18 or older with them. For treatment visits, patients cannot have anyone with them due to current Covid guidelines and our immunocompromised population.   

## 2020-11-02 NOTE — ED Triage Notes (Signed)
Pt presents to the ED from the cancer center following a rapid response. Report received from the rapid response nurse. Pt was undergoing a chemotherapy tx and "spiked a 1070F fever." Pt was given Tylenol, Solu-Medrol, Benadryl and Pepcid at that time. Pt is afebrile in the ED at this time at 98.70F. Pt's HR is 101bpm. VS are otherwise stable. Pt is alert and oriented to person, place and time, however answers incorrectly to the current year.

## 2020-11-02 NOTE — Progress Notes (Signed)
Pharmacy Antibiotic Note  Angelica [name] is a 80 y.o. female admitted on 11/02/2020 with sepsis.  Pharmacy has been consulted for vancomycin and cefepime dosing.  IV Antibiotics administered in ED on 6/15: Vancomycin 1250 mg Cefepime 2 g  Plan: Continue Vancomycin 1000 mg IV every 24 hours (Goal AUC 400-550, Expected AUC: 448.5, SCr used: rounded to 0.8) Continue Cefepime 2 g IV every 12 hours Monitor clinical picture, renal function, vancomycin levels if indicated F/U C&S, abx deescalation / LOT    Height: 5\' 5"  (165.1 cm) Weight: 66.9 kg (147 lb 7.8 oz) IBW/kg (Calculated) : 57  Temp (24hrs), Avg:100.6 F (38.1 C), Min:97 F (36.1 C), Max:103.1 F (39.5 C)  Recent Labs  Lab 11/02/20 0818 11/02/20 1217  WBC 7.1 13.0*  CREATININE 0.77 0.76  LATICACIDVEN  --  1.5    Estimated Creatinine Clearance: 50.5 mL/min (by C-G formula based on SCr of 0.76 mg/dL).    Allergies  Allergen Reactions   Asa Buff (Mag [Buffered Aspirin] Other (See Comments)    Excessive sweating   Ensure [Nutritional Supplements]     Or boost - upset stomach    Fish Allergy Other (See Comments)    Unknown reaction, but allergic   Other Other (See Comments)    Gelcaps; gel-containing capsules; extended-release medication - upset stomach    Peanut-Containing Drug Products Other (See Comments)    From allergy test   Penicillins Itching    Has patient had a PCN reaction causing immediate rash, facial/tongue/throat swelling, SOB or lightheadedness with hypotension: No Has patient had a PCN reaction causing severe rash involving mucus membranes or skin necrosis: No Has patient had a PCN reaction that required hospitalization No Has patient had a PCN reaction occurring within the last 10 years: No If all of the above answers are "NO", then may proceed with Cephalosporin use.   Shellfish-Derived Products Other (See Comments)    From allergy test   Strawberry Extract Other (See Comments)    From allergy  test     Antimicrobials this admission: 6/15 Flagyl x 1 6/15 cefepime >> 6/15 vancomycin >>   Dose adjustments this admission:   Microbiology results: 6/15 BCx: sent 6/15 UCx: sent   Thank you for allowing pharmacy to be a part of this patient's care.  Lariza Cothron P. Legrand Como, PharmD, [place] Please utilize [facility] for appropriate phone number to reach the unit pharmacist ([place]) 11/02/2020 4:38 PM

## 2020-11-02 NOTE — Significant Event (Signed)
Rapid Response Event Note   Reason for Call :  Patient spiked a 103.1 fever during chemotherapy administration  Initial Focused Assessment:  Upon assessment, patient appears to be in no distress. Patient understands where she is, the situation, and carries on pleasant conversation. States that she did wake up this morning with some chest tightness. No complaints of pain. States that she feels hot. Blankets removed from patient. Place on ekg monitor reading sinus tach in 110's. BP stable 117/57. RR 22. Temperature still 103.1. Oxygen saturation 100% on RA.      Interventions:  Prior to rapid response arrival, patient had been medicated with 650 mg oral tylenol, 25 mg of IV benadryl, 20 mg IV pepcid, and 125 mg of IV solu-medrol.   Plan of Care:  Patient transported to the ED on monitor for further work-up.    Event Summary:   MD Notified: taken to ED  Call Time: [facility] Time: 1130 End Time: [place], RN

## 2020-11-02 NOTE — Progress Notes (Signed)
Elink following Code Sepsis. 

## 2020-11-02 NOTE — ED Notes (Signed)
This nurse paged the lab and per lab "label was not scanned on pt's first lactic acid sent at 1217." Lab running first lactic now. Provider notified and aware.

## 2020-11-02 NOTE — Plan of Care (Signed)

## 2020-11-02 NOTE — ED Notes (Signed)
PA-C at the bedside to evaluate.

## 2020-11-02 NOTE — H&P (Signed)
History and Physical    Angelica [name]:[id number] DOB: [birth year] DOA: 11/02/2020  PCP: [name], MD  Patient coming from: Oncology office  I have personally briefly reviewed patient's old medical records in [place]  Chief Complaint: Fever  HPI: Angelica [name] is a 80 y.o. 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 Earlie Server, medical oncology, essential hypertension, chronic abdominal pain who was getting her immunotherapy at oncology clinic today and developed fever 130.  No paraspinous was called and she was subsequently sent to the emergency department.  Per ED reports, she continued to have persistent fever, her Tmax has been 102.9 here.Patient herself does not have any other complaint such as chest pain, shortness of breath, nausea, vomiting, any problem with urination or bowel movements, cough, shortness of breath or any other complaint.  ED Course: Upon arrival to ED, her temperature was 102.9, she was slightly tachycardic.  Mild leukocytosis on CBC.  Normal CMP and normal lactic acid.  No pathology on the chest x-ray.  ED physician discussed the case with patient's primary oncologist Dr. [name] who opined that patient likely had fever secondary to immunotherapy/chemotherapy however he recommended keeping in the hospital for observation and continued antibiotics and ruling out any underlying infection.  Patient received cefepime and vancomycin in the ED hospital service were consulted to admit the patient for further management.    Review of Systems: As per HPI otherwise negative.    Past Medical History:  Diagnosis Date   Arthritis    Asthma    Bowel obstruction ([place])    Cancer ([place])    Environmental allergies    GERD (gastroesophageal reflux disease)    Glaucoma 05/09/2017   H. pylori infection    History of radiation therapy 07/05/20-08/05/20   Left lung IMRT Dr. Sondra Come    HLD (hyperlipidemia)  01/16/2019   Hypertension    Iron deficiency anemia    Osteopenia 05/26/2017   Thyroid disease    Urticaria 07/25/2018    Past Surgical History:  Procedure Laterality Date   ABDOMINAL HYSTERECTOMY     BOWEL RESECTION N/A 05/23/2020   Procedure: SMALL BOWEL REPAIR;  Surgeon: Kieth Brightly, Arta Bruce, MD;  Location: [facility];  Service: General;  Laterality: N/A;   BREAST BIOPSY     BRONCHIAL BIOPSY  06/10/2020   Procedure: BRONCHIAL BIOPSIES;  Surgeon: Garner Nash, DO;  Location: [facility];  Service: Pulmonary;;   BRONCHIAL BRUSHINGS  06/10/2020   Procedure: BRONCHIAL BRUSHINGS;  Surgeon: Garner Nash, DO;  Location: [facility];  Service: Pulmonary;;   BRONCHIAL NEEDLE ASPIRATION BIOPSY  06/10/2020   Procedure: BRONCHIAL NEEDLE ASPIRATION BIOPSIES;  Surgeon: Garner Nash, DO;  Location: [facility];  Service: Pulmonary;;   BRONCHIAL WASHINGS  06/10/2020   Procedure: BRONCHIAL WASHINGS;  Surgeon: Garner Nash, DO;  Location: [facility];  Service: Pulmonary;;   COLON SURGERY     DIAGNOSTIC LARYNGOSCOPY N/A 05/23/2020   Procedure: ATTEMPTED DIAGNOSTIC LAPAROSCOPY WITH ADHESIONS;  Surgeon: Kieth Brightly, Arta Bruce, MD;  Location: [facility];  Service: General;  Laterality: N/A;   IR IMAGING GUIDED PORT INSERTION  07/15/2020   KNEE SURGERY     LAPAROTOMY N/A 04/18/2015   Procedure: Exploratory laparotomy with lysis of adhesions, possible bowel resection;  Surgeon: Ralene Ok, MD;  Location: [facility];  Service: General;  Laterality: N/A;   LAPAROTOMY N/A 05/23/2020   Procedure: EXPLORATORY LAPAROTOMY;  Surgeon: Mickeal Skinner, MD;  Location: [facility];  Service: General;  Laterality: N/A;   LYSIS OF ADHESION N/A 05/23/2020   Procedure: LYSIS OF ADHESION;  Surgeon: Kieth Brightly, Arta Bruce, MD;  Location: [facility];  Service: General;  Laterality: N/A;   NASAL SINUS SURGERY     THYROID SURGERY     VIDEO BRONCHOSCOPY WITH ENDOBRONCHIAL NAVIGATION Bilateral 06/10/2020   Procedure: VIDEO BRONCHOSCOPY  WITH ENDOBRONCHIAL NAVIGATION;  Surgeon: Garner Nash, DO;  Location: [facility];  Service: Pulmonary;  Laterality: Bilateral;   VIDEO BRONCHOSCOPY WITH ENDOBRONCHIAL ULTRASOUND  06/10/2020   Procedure: VIDEO BRONCHOSCOPY WITH ENDOBRONCHIAL ULTRASOUND;  Surgeon: Garner Nash, DO;  Location: [facility];  Service: Pulmonary;;     reports that she has quit smoking. Her smoking use included cigarettes. She has never used smokeless tobacco. She reports that she does not drink alcohol and does not use drugs.  Allergies  Allergen Reactions   Asa Buff (Mag [Buffered Aspirin] Other (See Comments)    Excessive sweating   Ensure [Nutritional Supplements]     Or boost - upset stomach    Fish Allergy Other (See Comments)    Unknown reaction, but allergic   Other Other (See Comments)    Gelcaps; gel-containing capsules; extended-release medication - upset stomach    Peanut-Containing Drug Products Other (See Comments)    From allergy test   Penicillins Itching    Has patient had a PCN reaction causing immediate rash, facial/tongue/throat swelling, SOB or lightheadedness with hypotension: No Has patient had a PCN reaction causing severe rash involving mucus membranes or skin necrosis: No Has patient had a PCN reaction that required hospitalization No Has patient had a PCN reaction occurring within the last 10 years: No If all of the above answers are "NO", then may proceed with Cephalosporin use.   Shellfish-Derived Products Other (See Comments)    From allergy test   Strawberry Extract Other (See Comments)    From allergy test     Family History  Problem Relation Age of Onset   Diabetes Mother    Hypertension Mother    Hypertension Father    Glaucoma Sister    Glaucoma Brother    Allergic rhinitis Neg Hx    Angioedema Neg Hx    Asthma Neg Hx    Eczema Neg Hx    Immunodeficiency Neg Hx    Urticaria Neg Hx     Prior to Admission medications   Medication Sig Start Date End  Date Taking? Authorizing Provider  acetaminophen (TYLENOL) 325 MG tablet Take 1 tablet (325 mg total) by mouth every 6 (six) hours as needed for headache or mild pain. 10/22/20  Yes Hongalgi, Lenis Dickinson, MD  alum & mag hydroxide-simeth (MAALOX/MYLANTA) 200-200-20 MG/5ML suspension Take 15 mLs by mouth every 6 (six) hours as needed for indigestion or heartburn.   Yes [provider]  amLODipine (NORVASC) 10 MG tablet Take 10 mg by mouth daily.   Yes [provider]  Benralizumab (FASENRA PEN) 30 MG/ML SOAJ Inject into the skin every 8 (eight) weeks.   Yes [provider]  budesonide-formoterol (SYMBICORT) 80-4.5 MCG/ACT inhaler Inhale 2 puffs into the lungs 2 (two) times daily. Use for asthma flares 2 puffs twice a day for 2 weeks or until cough and wheeze free Patient taking differently: Inhale 2 puffs into the lungs 2 (two) times daily as needed (for asthma flares, for 2 weeks, or until cough and wheeze-free). 01/28/20  Yes Kennith Gain, MD  Cholecalciferol (VITAMIN D3) 50 MCG (  2000 UT) TABS Take 2,000 Units by mouth daily.   Yes [provider]  Durvalumab (IMFINZI IV) Inject 1,500 mg into the vein every 28 (twenty-eight) days. Every 4 weeks   Yes Curt Bears, MD  EPINEPHrine (EPIPEN 2-PAK) 0.3 mg/0.3 mL IJ SOAJ injection Use as directed for severe allergic reaction 01/28/20  Yes Padgett, Rae Halsted, MD  ipratropium (ATROVENT) 0.06 % nasal spray Place 2 sprays into both nostrils 3 (three) times daily. Patient taking differently: Place 2 sprays into both nostrils 3 (three) times daily as needed for rhinitis. 07/28/20  Yes Padgett, Rae Halsted, MD  linaclotide [facility]) 72 MCG capsule TAKE 1 CAPSULE BY MOUTH ONCE DAILY AS NEEDED Patient taking differently: Take 72 mcg by mouth daily as needed (constipation). 06/14/20  Yes [name], MD  loratadine (CLARITIN) 10 MG tablet Take 10 mg by mouth daily.   Yes [provider]  Magnesium  Hydroxide (PHILLIPS MILK OF MAGNESIA PO) Take 15-30 mLs by mouth daily as needed (for constipation).   Yes [provider]  montelukast (SINGULAIR) 10 MG tablet Take 1 tablet (10 mg total) by mouth at bedtime. Patient taking differently: Take 10 mg by mouth as needed (allergy symptoms). 08/15/20  Yes Padgett, Rae Halsted, MD  polyethylene glycol (MIRALAX / GLYCOLAX) 17 g packet Take 17 g by mouth daily as needed for mild constipation.   Yes [provider]  pravastatin (PRAVACHOL) 80 MG tablet Take 1 tablet (80 mg total) by mouth daily. 06/14/20  Yes [name], MD  telmisartan (MICARDIS) 80 MG tablet Take 80 mg by mouth daily.   Yes [provider]  traMADol (ULTRAM) 50 MG tablet Take 1 tablet (50 mg total) by mouth every 6 (six) hours as needed for moderate pain. 08/01/20  Yes [name], MD  vitamin B-12 (CYANOCOBALAMIN) 1000 MCG tablet Take 1,000 mcg by mouth daily.   Yes [provider]  ondansetron (ZOFRAN) 8 MG tablet Take 1 tablet (8 mg total) by mouth every 8 (eight) hours as needed for nausea or vomiting. 11/02/20   Curt Bears, MD  prochlorperazine (COMPAZINE) 10 MG tablet Take 1 tablet (10 mg total) by mouth every 6 (six) hours as needed for nausea or vomiting. Patient not taking: No sig reported 06/22/20   Curt Bears, MD    Physical Exam: Vitals:   11/02/20 1159 11/02/20 1233 11/02/20 1308 11/02/20 1409  BP:   (!) 102/59 108/62  Pulse:   93 90  Resp:   18 14  Temp:  (!) 102.9 F (39.4 C)    TempSrc:  Rectal    SpO2:   99% 100%  Weight: 66.9 kg     Height: 5\' 5"  (1.651 m)       Constitutional: NAD, calm, comfortable Vitals:   11/02/20 1159 11/02/20 1233 11/02/20 1308 11/02/20 1409  BP:   (!) 102/59 108/62  Pulse:   93 90  Resp:   18 14  Temp:  (!) 102.9 F (39.4 C)    TempSrc:  Rectal    SpO2:   99% 100%  Weight: 66.9 kg     Height: 5\' 5"  (1.651 m)      Eyes: PERRL, lids and conjunctivae normal, thin and  chronically sick looking and cachectic ENMT: Mucous membranes are moist. Posterior pharynx clear of any exudate or lesions.Normal dentition.  Neck: normal, supple, no masses, no thyromegaly Respiratory: clear to auscultation bilaterally, no wheezing, no crackles. Normal respiratory effort. No accessory muscle use.  Cardiovascular:  Regular rate and rhythm, no murmurs / rubs / gallops. No extremity edema. 2+ pedal pulses. No carotid bruits.  Abdomen: no tenderness, no masses palpated. No hepatosplenomegaly. Bowel sounds positive.  Musculoskeletal: no clubbing / cyanosis. No joint deformity upper and lower extremities. Good ROM, no contractures. Normal muscle tone.  Skin: no rashes, lesions, ulcers. No induration Neurologic: CN 2-12 grossly intact. Sensation intact, DTR normal. Strength 5/5 in all 4.  Psychiatric: Normal judgment and insight. Alert and oriented x 3. Normal mood.    Labs on Admission: I have personally reviewed following labs and imaging studies  CBC: Recent Labs  Lab 11/02/20 0818 11/02/20 1217  WBC 7.1 13.0*  NEUTROABS 5.3 12.6*  HGB 11.1* 10.5*  HCT 33.2* 31.6*  MCV 90.5 92.1  PLT 220 244   Basic Metabolic Panel: Recent Labs  Lab 11/02/20 0818 11/02/20 1217  NA 136 135  K 4.2 3.6  CL 104 106  CO2 22 23  GLUCOSE 94 125*  BUN 11 12  CREATININE 0.77 0.76  CALCIUM 9.3 8.4*   GFR: Estimated Creatinine Clearance: 50.5 mL/min (by C-G formula based on SCr of 0.76 mg/dL). Liver Function Tests: Recent Labs  Lab 11/02/20 0818 11/02/20 1217  AST 18 19  ALT 9 11  ALKPHOS 70 59  BILITOT 0.3 0.4  PROT 7.1 6.4*  ALBUMIN 3.1* 3.1*   No results for input(s): LIPASE, AMYLASE in the last 168 hours. No results for input(s): AMMONIA in the last 168 hours. Coagulation Profile: Recent Labs  Lab 11/02/20 1217  INR 1.0   Cardiac Enzymes: No results for input(s): CKTOTAL, CKMB, CKMBINDEX, TROPONINI in the last 168 hours. BNP (last 3 results) No results for  input(s): PROBNP in the last 8760 hours. HbA1C: No results for input(s): HGBA1C in the last 72 hours. CBG: No results for input(s): GLUCAP in the last 168 hours. Lipid Profile: No results for input(s): CHOL, HDL, LDLCALC, TRIG, CHOLHDL, LDLDIRECT in the last 72 hours. Thyroid Function Tests: Recent Labs    11/02/20 0818  TSH 6.896*   Anemia Panel: No results for input(s): VITAMINB12, FOLATE, FERRITIN, TIBC, IRON, RETICCTPCT in the last 72 hours. Urine analysis:    Component Value Date/Time   COLORURINE YELLOW 10/20/2020 0810   APPEARANCEUR HAZY (A) 10/20/2020 0810   LABSPEC 1.014 10/20/2020 0810   PHURINE 5.0 10/20/2020 0810   GLUCOSEU NEGATIVE 10/20/2020 0810   GLUCOSEU NEGATIVE 05/03/2020 1707   HGBUR NEGATIVE 10/20/2020 0810   BILIRUBINUR NEGATIVE 10/20/2020 0810   KETONESUR 20 (A) 10/20/2020 0810   PROTEINUR 30 (A) 10/20/2020 0810   UROBILINOGEN 0.2 05/03/2020 1707   NITRITE NEGATIVE 10/20/2020 0810   LEUKOCYTESUR NEGATIVE 10/20/2020 0810    Radiological Exams on Admission: DG Chest Port 1 View  Result Date: 11/02/2020 CLINICAL DATA:  Fever. EXAM: PORTABLE CHEST 1 VIEW COMPARISON:  October 19, 2020 FINDINGS: Port-A-Cath tip is in the superior vena cava. No pneumothorax. No edema or airspace opacity. Heart size and pulmonary vascularity normal. No adenopathy. There are surgical clips at the right cervical-thoracic junction. IMPRESSION: Port-A-Cath as described without pneumothorax. Lungs clear. Cardiac silhouette normal. Aortic Atherosclerosis (ICD10-I70.0). Electronically Signed   By: Lowella Grip III M.D.   On: 11/02/2020 13:28    EKG: Independently reviewed.  Sinus tachycardia with no acute ST-T wave changes.  Assessment/Plan Active Problems:   SIRS (systemic inflammatory response syndrome) (HCC)    Fever/SIRS: Patient meets SIRS criteria based off of fever, tachycardia and leukocytosis but no source has been identified thus she  does not meet criteria for sepsis.   We will continue cefepime and vancomycin.  Blood cultures have been drawn which we will follow.  Monitor fever curve and repeat labs in the morning.  Essential hypertension: Patient's blood pressure is on the low side so I will hold her antihypertensives for now.  Non-small cell lung carcinoma: Follows with Dr. [name] as outpatient.  S/p immunotherapy today.  Further management per oncology.  DVT prophylaxis: enoxaparin (LOVENOX) injection 40 mg Start: 11/02/20 1445 Code Status: Full code Family Communication: Daughter present at bedside.  Plan of care discussed with patient in length and he verbalized understanding and agreed with it.  Also discussed with daughter. Disposition Plan: May potentially discharge in next 1 to 2 days Consults called: None called, Dr. [name] is aware of the admission. Admission status: Observation   Status is: Observation  The patient remains OBS appropriate and will d/c before 2 midnights.  Dispo: The patient is from: Home              Anticipated d/c is to: Home              Patient currently is not medically stable to d/c.   Difficult to place patient No       Darliss Cheney MD [facility]  11/02/2020, 2:40 PM  To contact the attending provider between 7A-7P or the covering provider during after hours 7P-7A, please log into the web site [email or web address]

## 2020-11-02 NOTE — Progress Notes (Signed)
Pt presented today for a durvalumab infusion. Pt arrived to infusion room reported feeling cold but fine otherwise. During the wait for her durvalumab, pt requested blankets to keep warm. The durvalumab was hung and pt continued to become colder and colder. Her temperature was taken and it was 99.4, pt began to show rigors at this time as well. Drug was stopped and MD was called and 25mg  benadryl was given. Temp rechecked soon after and it was 102.5, MD was called to bedside, solumedrol, Pepcid, and tylenol was given along with NS. Darvalumab was discontiued and pt was then oberved for some time. Temp rechecked and was 102.9, then again approx. 5 mins later and was 103.1 where it stayed. This RN called rapid response RN, who arrived and recommended pt to ED. ED was called, pt wheeled over, RRN gave report to ED RN, pt port remained accessed and SL for use in ED. This RN called pt family to update them, they verbalized understanding and all questions were answered.

## 2020-11-03 ENCOUNTER — Telehealth: Payer: Self-pay | Admitting: Internal Medicine

## 2020-11-03 DIAGNOSIS — R7989 Other specified abnormal findings of blood chemistry: Secondary | ICD-10-CM | POA: Diagnosis not present

## 2020-11-03 DIAGNOSIS — M7989 Other specified soft tissue disorders: Secondary | ICD-10-CM | POA: Diagnosis not present

## 2020-11-03 DIAGNOSIS — R7881 Bacteremia: Secondary | ICD-10-CM | POA: Diagnosis not present

## 2020-11-03 DIAGNOSIS — Z87891 Personal history of nicotine dependence: Secondary | ICD-10-CM | POA: Diagnosis not present

## 2020-11-03 DIAGNOSIS — Z95828 Presence of other vascular implants and grafts: Secondary | ICD-10-CM | POA: Diagnosis not present

## 2020-11-03 DIAGNOSIS — H409 Unspecified glaucoma: Secondary | ICD-10-CM | POA: Diagnosis present

## 2020-11-03 DIAGNOSIS — R6510 Systemic inflammatory response syndrome (SIRS) of non-infectious origin without acute organ dysfunction: Secondary | ICD-10-CM | POA: Diagnosis present

## 2020-11-03 DIAGNOSIS — A4159 Other Gram-negative sepsis: Secondary | ICD-10-CM | POA: Diagnosis present

## 2020-11-03 DIAGNOSIS — K6389 Other specified diseases of intestine: Secondary | ICD-10-CM | POA: Diagnosis not present

## 2020-11-03 DIAGNOSIS — J9811 Atelectasis: Secondary | ICD-10-CM | POA: Diagnosis not present

## 2020-11-03 DIAGNOSIS — J45909 Unspecified asthma, uncomplicated: Secondary | ICD-10-CM | POA: Diagnosis present

## 2020-11-03 DIAGNOSIS — I10 Essential (primary) hypertension: Secondary | ICD-10-CM | POA: Diagnosis present

## 2020-11-03 DIAGNOSIS — K429 Umbilical hernia without obstruction or gangrene: Secondary | ICD-10-CM | POA: Diagnosis not present

## 2020-11-03 DIAGNOSIS — K219 Gastro-esophageal reflux disease without esophagitis: Secondary | ICD-10-CM | POA: Diagnosis present

## 2020-11-03 DIAGNOSIS — Z923 Personal history of irradiation: Secondary | ICD-10-CM | POA: Diagnosis not present

## 2020-11-03 DIAGNOSIS — B961 Klebsiella pneumoniae [K. pneumoniae] as the cause of diseases classified elsewhere: Secondary | ICD-10-CM | POA: Diagnosis not present

## 2020-11-03 DIAGNOSIS — G8929 Other chronic pain: Secondary | ICD-10-CM | POA: Diagnosis present

## 2020-11-03 DIAGNOSIS — Z833 Family history of diabetes mellitus: Secondary | ICD-10-CM | POA: Diagnosis not present

## 2020-11-03 DIAGNOSIS — D8481 Immunodeficiency due to conditions classified elsewhere: Secondary | ICD-10-CM | POA: Diagnosis present

## 2020-11-03 DIAGNOSIS — B999 Unspecified infectious disease: Secondary | ICD-10-CM | POA: Diagnosis present

## 2020-11-03 DIAGNOSIS — Z8249 Family history of ischemic heart disease and other diseases of the circulatory system: Secondary | ICD-10-CM | POA: Diagnosis not present

## 2020-11-03 DIAGNOSIS — T380X5A Adverse effect of glucocorticoids and synthetic analogues, initial encounter: Secondary | ICD-10-CM | POA: Diagnosis present

## 2020-11-03 DIAGNOSIS — K828 Other specified diseases of gallbladder: Secondary | ICD-10-CM | POA: Diagnosis not present

## 2020-11-03 DIAGNOSIS — Z880 Allergy status to penicillin: Secondary | ICD-10-CM | POA: Diagnosis not present

## 2020-11-03 DIAGNOSIS — Z886 Allergy status to analgesic agent status: Secondary | ICD-10-CM | POA: Diagnosis not present

## 2020-11-03 DIAGNOSIS — E785 Hyperlipidemia, unspecified: Secondary | ICD-10-CM | POA: Diagnosis present

## 2020-11-03 DIAGNOSIS — Z85118 Personal history of other malignant neoplasm of bronchus and lung: Secondary | ICD-10-CM | POA: Diagnosis not present

## 2020-11-03 DIAGNOSIS — C3432 Malignant neoplasm of lower lobe, left bronchus or lung: Secondary | ICD-10-CM | POA: Diagnosis present

## 2020-11-03 DIAGNOSIS — Z7951 Long term (current) use of inhaled steroids: Secondary | ICD-10-CM | POA: Diagnosis not present

## 2020-11-03 DIAGNOSIS — R509 Fever, unspecified: Secondary | ICD-10-CM | POA: Diagnosis not present

## 2020-11-03 DIAGNOSIS — Z20822 Contact with and (suspected) exposure to covid-19: Secondary | ICD-10-CM | POA: Diagnosis present

## 2020-11-03 DIAGNOSIS — J90 Pleural effusion, not elsewhere classified: Secondary | ICD-10-CM | POA: Diagnosis not present

## 2020-11-03 DIAGNOSIS — E049 Nontoxic goiter, unspecified: Secondary | ICD-10-CM | POA: Diagnosis present

## 2020-11-03 DIAGNOSIS — Z90710 Acquired absence of both cervix and uterus: Secondary | ICD-10-CM | POA: Diagnosis not present

## 2020-11-03 DIAGNOSIS — Z83511 Family history of glaucoma: Secondary | ICD-10-CM | POA: Diagnosis not present

## 2020-11-03 DIAGNOSIS — C3490 Malignant neoplasm of unspecified part of unspecified bronchus or lung: Secondary | ICD-10-CM | POA: Diagnosis not present

## 2020-11-03 DIAGNOSIS — Z79899 Other long term (current) drug therapy: Secondary | ICD-10-CM | POA: Diagnosis not present

## 2020-11-03 LAB — BASIC METABOLIC PANEL
Anion gap: 7 (ref 5–15)
BUN: 13 mg/dL (ref 8–23)
CO2: 21 mmol/L — ABNORMAL LOW (ref 22–32)
Calcium: 8.7 mg/dL — ABNORMAL LOW (ref 8.9–10.3)
Chloride: 110 mmol/L (ref 98–111)
Creatinine, Ser: 0.85 mg/dL (ref 0.44–1.00)
GFR, Estimated: >60 mL/min (ref >60)
Glucose, Bld: 143 mg/dL — ABNORMAL HIGH (ref 70–99)
Potassium: 3.7 mmol/L (ref 3.5–5.1)
Sodium: 138 mmol/L (ref 135–145)

## 2020-11-03 LAB — BLOOD CULTURE ID PANEL (REFLEXED) - BCID2

## 2020-11-03 LAB — CBC
HCT: 30.7 % — ABNORMAL LOW (ref 36.0–46.0)
Hemoglobin: 10.2 g/dL — ABNORMAL LOW (ref 12.0–15.0)
MCH: 30.8 pg (ref 26.0–34.0)
MCHC: 33.2 g/dL (ref 30.0–36.0)
MCV: 92.7 fL (ref 80.0–100.0)
Platelets: 191 10*3/uL (ref 150–400)
RBC: 3.31 MIL/uL — ABNORMAL LOW (ref 3.87–5.11)
RDW: 13.2 % (ref 11.5–15.5)
WBC: 22.3 10*3/uL — ABNORMAL HIGH (ref 4.0–10.5)
nRBC: 0 % (ref 0.0–0.2)

## 2020-11-03 LAB — PHOSPHORUS: Phosphorus: 3.5 mg/dL (ref 2.5–4.6)

## 2020-11-03 MED ORDER — CHLORHEXIDINE GLUCONATE CLOTH 2 % EX PADS
6.0000 | MEDICATED_PAD | Freq: Every day | CUTANEOUS | Status: DC
  Administered 2020-11-03 – 2020-11-08 (×6): 6 via TOPICAL

## 2020-11-03 MED ORDER — ALBUTEROL SULFATE HFA 108 (90 BASE) MCG/ACT IN AERS: 1.0000 | INHALATION_SPRAY | Freq: Four times a day (QID) | RESPIRATORY_TRACT | Status: DC | PRN

## 2020-11-03 MED ORDER — SODIUM CHLORIDE 0.9 % IV SOLN
2.0000 g | INTRAVENOUS | Status: DC
  Administered 2020-11-03 – 2020-11-05 (×3): 2 g via INTRAVENOUS
  Filled 2020-11-03: qty 2
  Filled 2020-11-03: qty 20
  Filled 2020-11-03: qty 2

## 2020-11-03 MED ORDER — ALBUTEROL SULFATE (2.5 MG/3ML) 0.083% IN NEBU: 2.5000 mg | INHALATION_SOLUTION | Freq: Four times a day (QID) | RESPIRATORY_TRACT | Status: DC | PRN

## 2020-11-03 MED ORDER — DICLOFENAC SODIUM 1 % EX GEL
2.0000 g | Freq: Four times a day (QID) | CUTANEOUS | Status: DC | PRN
  Filled 2020-11-03: qty 100

## 2020-11-03 NOTE — Progress Notes (Signed)
PROGRESS NOTE    Angelica [name]  HWE:[id number] DOB: 1940-08-27 DOA: 11/02/2020 PCP: [name], MD  Chief Complaint  Patient presents with   Fever   Brief Narrative:  Angelica [name] is Angelica [name] 80 y.o. 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 Earlie Server, medical oncology, essential hypertension, chronic abdominal pain who was getting her immunotherapy at oncology clinic today and developed fever 130.  No paraspinous was called and she was subsequently sent to the emergency department.  Per ED reports, she continued to have persistent fever, her Tmax has been 102.9 here.Patient herself does not have any other complaint such as chest pain, shortness of breath, nausea, vomiting, any problem with urination or bowel movements, cough, shortness of breath or any other complaint.   ED Course: Upon arrival to ED, her temperature was 102.9, she was slightly tachycardic.  Mild leukocytosis on CBC.  Normal CMP and normal lactic acid.  No pathology on the chest x-ray.  ED physician discussed the case with patient's primary oncologist Dr. [name] who opined that patient likely had fever secondary to immunotherapy/chemotherapy however he recommended keeping in the hospital for observation and continued antibiotics and ruling out any underlying infection.  Patient received cefepime and vancomycin in the ED hospital service were consulted to admit the patient for further management.    Assessment & Plan:   Active Problems:   SIRS (systemic inflammatory response syndrome) (HCC)  Sepsis 2/2 Klebsiella Pneumoniae Bacteremia: Patient meets SIRS criteria based off of fever, tachycardia and leukocytosis.   Transitioned to ceftriaxone 6/16 - present Blood cultures pending Urine culture pending (UA not c/w UTI) Unclear etiology of bacteremia, often UTI, though UA not c/w this.     Leukocytosis 2/2 steroids and infection  Essential hypertension:  continue to hold BP meds   Non-small cell lung carcinoma: Follows with Dr. [name] as outpatient.  S/p immunotherapy 6/15 (also had dose of steroids).  Further management per oncology.  Asthma dulera Albuterol prn Singulair  Shoulder Pain  Add voltaren Tramadol prn  DVT prophylaxis: lovenox Code Status: full  Family Communication: daughter over phone Disposition:   Status is: Observation  The patient will require care spanning > 2 midnights and should be moved to inpatient because: Inpatient level of care appropriate due to severity of illness  Dispo: The patient is from: Home              Anticipated d/c is to: Home              Patient currently is not medically stable to d/c.   Difficult to place patient No       Consultants:  none  Procedures:  none  Antimicrobials:  Anti-infectives (From admission, onward)    Start     Dose/Rate Route Frequency Ordered Stop   11/03/20 1400  vancomycin (VANCOCIN) IVPB 1000 mg/200 mL premix  Status:  Discontinued        1,000 mg 200 mL/hr over 60 Minutes Intravenous Every 24 hours 11/02/20 1631 11/03/20 0831   11/03/20 0630  cefTRIAXone (ROCEPHIN) 2 g in sodium chloride 0.9 % 100 mL IVPB        2 g 200 mL/hr over 30 Minutes Intravenous Every 24 hours 11/03/20 0537     11/02/20 2200  ceFEPIme (MAXIPIME) 2 g in sodium chloride 0.9 % 100 mL IVPB  Status:  Discontinued        2 g 200 mL/hr over 30 Minutes  Intravenous Every 12 hours 11/02/20 1626 11/03/20 0537   11/02/20 1400  vancomycin (VANCOREADY) IVPB 1250 mg/250 mL        1,250 mg 166.7 mL/hr over 90 Minutes Intravenous  Once 11/02/20 1331 11/02/20 1658   11/02/20 1330  ceFEPIme (MAXIPIME) 2 g in sodium chloride 0.9 % 100 mL IVPB        2 g 200 mL/hr over 30 Minutes Intravenous  Once 11/02/20 1320 11/02/20 1530   11/02/20 1330  metroNIDAZOLE (FLAGYL) IVPB 500 mg        500 mg 100 mL/hr over 60 Minutes Intravenous  Once 11/02/20 1320 11/02/20 1530   11/02/20 1330   vancomycin (VANCOCIN) IVPB 1000 mg/200 mL premix  Status:  Discontinued        1,000 mg 200 mL/hr over 60 Minutes Intravenous  Once 11/02/20 1320 11/02/20 1329         Subjective: No new complaints MSK pain in L shoulder  Objective: Vitals:   11/03/20 0538 11/03/20 0854 11/03/20 1300 11/03/20 1352  BP: 121/65   118/72  Pulse: 74   83  Resp: 17  17 16   Temp: 98 F (36.7 C)   97.9 F (36.6 C)  TempSrc: Oral   Oral  SpO2: 100% 100%  100%  Weight:      Height:        Intake/Output Summary (Last 24 hours) at 11/03/2020 1450 Last data filed at 11/03/2020 0900 Gross per 24 hour  Intake 1676.08 ml  Output 1000 ml  Net 676.08 ml   Filed Weights   11/02/20 1159  Weight: 66.9 kg    Examination:  General exam: Appears calm and comfortable  Respiratory system: Clear to auscultation. Respiratory effort normal. Cardiovascular system: S1 & S2 heard, RRR. Gastrointestinal system: Abdomen is nondistended, soft and nontender.  Central nervous system: Alert and oriented. No focal neurological deficits. Extremities: no LEE Skin: No rashes, lesions or ulcers Psychiatry: Judgement and insight appear normal. Mood & affect appropriate.     Data Reviewed: I have personally reviewed following labs and imaging studies  CBC: Recent Labs  Lab 11/02/20 0818 11/02/20 1217 11/03/20 0428  WBC 7.1 13.0* 22.3*  NEUTROABS 5.3 12.6*  --   HGB 11.1* 10.5* 10.2*  HCT 33.2* 31.6* 30.7*  MCV 90.5 92.1 92.7  PLT 220 192 941    Basic Metabolic Panel: Recent Labs  Lab 11/02/20 0818 11/02/20 1217 11/03/20 0428  NA 136 135 138  K 4.2 3.6 3.7  CL 104 106 110  CO2 22 23 21*  GLUCOSE 94 125* 143*  BUN 11 12 13   CREATININE 0.77 0.76 0.85  CALCIUM 9.3 8.4* 8.7*  MG  --  1.6*  --     GFR: Estimated Creatinine Clearance: 47.5 mL/min (by C-G formula based on SCr of 0.85 mg/dL).  Liver Function Tests: Recent Labs  Lab 11/02/20 0818 11/02/20 1217  AST 18 19  ALT 9 11  ALKPHOS 70  59  BILITOT 0.3 0.4  PROT 7.1 6.4*  ALBUMIN 3.1* 3.1*    CBG: No results for input(s): GLUCAP in the last 168 hours.   Recent Results (from the past 240 hour(s))  Blood Culture (routine x 2)     Status: None (Preliminary result)   Collection Time: 11/02/20 12:17 PM   Specimen: BLOOD  Result Value Ref Range Status   Specimen Description   Final    BLOOD RIGHT ANTECUBITAL Performed at [facility] [address] [place], [place] [address]  Special Requests   Final    BOTTLES DRAWN AEROBIC AND ANAEROBIC Blood Culture results may not be optimal due to an inadequate volume of blood received in culture bottles Performed at [facility] [address] [place], [place] [address]    Culture  Setup Time   Final    GRAM NEGATIVE RODS IN BOTH AEROBIC AND ANAEROBIC BOTTLES Performed at [facility], [place] [address] [place], [place] [address]    Culture GRAM NEGATIVE RODS  Final   Report Status PENDING  Incomplete  Blood Culture (routine x 2)     Status: None (Preliminary result)   Collection Time: 11/02/20 12:17 PM   Specimen: BLOOD  Result Value Ref Range Status   Specimen Description   Final    BLOOD LEFT ANTECUBITAL Performed at [facility] [address], [place], [place] [address]    Special Requests   Final    BOTTLES DRAWN AEROBIC AND ANAEROBIC Blood Culture results may not be optimal due to an inadequate volume of blood received in culture bottles Performed at [facility] [address] [place], [place] [address]    Culture  Setup Time   Final    GRAM NEGATIVE RODS IN BOTH AEROBIC AND ANAEROBIC BOTTLES Organism ID to follow Performed at [facility], [place] [address], [place], [place] [address]    Culture GRAM NEGATIVE RODS  Final   Report Status PENDING  Incomplete  Blood Culture ID Panel (Reflexed)     Status: Abnormal   Collection Time: 11/02/20 12:17 PM  Result Value Ref  Range Status   Enterococcus faecalis NOT DETECTED NOT DETECTED Final   Enterococcus Faecium NOT DETECTED NOT DETECTED Final   Listeria monocytogenes NOT DETECTED NOT DETECTED Final   Staphylococcus species NOT DETECTED NOT DETECTED Final   Staphylococcus aureus (BCID) NOT DETECTED NOT DETECTED Final   Staphylococcus epidermidis NOT DETECTED NOT DETECTED Final   Staphylococcus lugdunensis NOT DETECTED NOT DETECTED Final   Streptococcus species NOT DETECTED NOT DETECTED Final   Streptococcus agalactiae NOT DETECTED NOT DETECTED Final   Streptococcus pneumoniae NOT DETECTED NOT DETECTED Final   Streptococcus pyogenes NOT DETECTED NOT DETECTED Final   Militza Devery.calcoaceticus-baumannii NOT DETECTED NOT DETECTED Final   Bacteroides fragilis NOT DETECTED NOT DETECTED Final   Enterobacterales DETECTED (Telesa Jeancharles) NOT DETECTED Final    Comment: Enterobacterales represent Rand Etchison large order of gram negative bacteria, not Frances Joynt single organism. CRITICAL RESULT CALLED TO, READ BACK BY AND VERIFIED WITH: [place], AT [phone] 11/03/20 D. VANHOOK    Enterobacter cloacae complex NOT DETECTED NOT DETECTED Final   Escherichia coli NOT DETECTED NOT DETECTED Final   Klebsiella aerogenes NOT DETECTED NOT DETECTED Final   Klebsiella oxytoca NOT DETECTED NOT DETECTED Final   Klebsiella pneumoniae DETECTED (Matt[place] Blue) NOT DETECTED Final    Comment: CRITICAL RESULT CALLED TO, READ BACK BY AND VERIFIED WITH: Seleta Rhymes PHARMD, AT [phone] 11/03/20 D. VANHOOK    Proteus species NOT DETECTED NOT DETECTED Final   Salmonella species NOT DETECTED NOT DETECTED Final   Serratia marcescens NOT DETECTED NOT DETECTED Final   Haemophilus influenzae NOT DETECTED NOT DETECTED Final   Neisseria meningitidis NOT DETECTED NOT DETECTED Final   Pseudomonas aeruginosa NOT DETECTED NOT DETECTED Final   Stenotrophomonas maltophilia NOT DETECTED NOT DETECTED Final   Candida albicans NOT DETECTED NOT DETECTED Final   Candida auris NOT DETECTED NOT DETECTED Final    Candida glabrata NOT DETECTED NOT DETECTED  Final   Candida krusei NOT DETECTED NOT DETECTED Final   Candida parapsilosis NOT DETECTED NOT DETECTED Final   Candida tropicalis NOT DETECTED NOT DETECTED Final   Cryptococcus neoformans/gattii NOT DETECTED NOT DETECTED Final   CTX-M ESBL NOT DETECTED NOT DETECTED Final   Carbapenem resistance IMP NOT DETECTED NOT DETECTED Final   Carbapenem resistance KPC NOT DETECTED NOT DETECTED Final   Carbapenem resistance NDM NOT DETECTED NOT DETECTED Final   Carbapenem resist OXA 48 LIKE NOT DETECTED NOT DETECTED Final   Carbapenem resistance VIM NOT DETECTED NOT DETECTED Final    Comment: Performed at [facility], [place] [address] [place] [address], [place], [place] [address]  Resp Panel by RT-PCR (Flu Devyon Keator&B, Covid) Nasopharyngeal Swab     Status: None   Collection Time: 11/02/20  1:10 PM   Specimen: Nasopharyngeal Swab; Nasopharyngeal(NP) swabs in vial transport medium  Result Value Ref Range Status   SARS Coronavirus 2 by RT PCR NEGATIVE NEGATIVE Final    Comment: (NOTE) SARS-CoV-2 target nucleic acids are NOT DETECTED.  The SARS-CoV-2 RNA is generally detectable in upper respiratory specimens during the acute phase of infection. The lowest concentration of SARS-CoV-2 viral copies this assay can detect is 138 copies/mL. Jewel Mcafee negative result does not preclude SARS-Cov-2 infection and should not be used as the sole basis for treatment or other patient management decisions. Loann Chahal negative result may occur with  improper specimen collection/handling, submission of specimen other than nasopharyngeal swab, presence of viral mutation(s) within the areas targeted by this assay, and inadequate number of viral copies(<138 copies/mL). Floriene Jeschke negative result must be combined with clinical observations, patient history, and epidemiological information. The expected result is Negative.  Fact Sheet for Patients:  EntrepreneurPulse.com.au  Fact Sheet for Healthcare  Providers:  IncredibleEmployment.be  This test is no t yet approved or cleared by the [place] FDA and  has been authorized for detection and/or diagnosis of SARS-CoV-2 by FDA under an Emergency Use Authorization (EUA). This EUA will remain  in effect (meaning this test can be used) for the duration of the COVID-19 declaration under Section 564(b)(1) of the Act, 21 U.S.C.section 360bbb-3(b)(1), unless the authorization is terminated  or revoked sooner.       Influenza Malek Skog by PCR NEGATIVE NEGATIVE Final   Influenza B by PCR NEGATIVE NEGATIVE Final    Comment: (NOTE) The Xpert Xpress SARS-CoV-2/FLU/RSV plus assay is intended as an aid in the diagnosis of influenza from Nasopharyngeal swab specimens and should not be used as Markevious Ehmke sole basis for treatment. Nasal washings and aspirates are unacceptable for Xpert Xpress SARS-CoV-2/FLU/RSV testing.  Fact Sheet for Patients: EntrepreneurPulse.com.au  Fact Sheet for Healthcare Providers: IncredibleEmployment.be  This test is not yet approved or cleared by the [place] FDA and has been authorized for detection and/or diagnosis of SARS-CoV-2 by FDA under an Emergency Use Authorization (EUA). This EUA will remain in effect (meaning this test can be used) for the duration of the COVID-19 declaration under Section 564(b)(1) of the Act, 21 U.S.C. section 360bbb-3(b)(1), unless the authorization is terminated or revoked.  Performed at [facility], [place] [address] [place], [place] [address]          Radiology Studies: [facility] Chest Port 1 View  Result Date: 11/02/2020 CLINICAL DATA:  Fever. EXAM: PORTABLE CHEST 1 VIEW COMPARISON:  October 19, 2020 FINDINGS: Port-Mykah Bellomo-Cath tip is in the superior vena cava. No pneumothorax. No edema or airspace opacity. Heart size and pulmonary vascularity normal. No adenopathy. There are  surgical clips at the right cervical-thoracic  junction. IMPRESSION: Port-Mackenzy Grumbine-Cath as described without pneumothorax. Lungs clear. Cardiac silhouette normal. Aortic Atherosclerosis (ICD10-I70.0). Electronically Signed   By: Lowella Grip III M.D.   On: 11/02/2020 13:28        Scheduled Meds:  acetaminophen  650 mg Oral Once   Chlorhexidine Gluconate Cloth  6 each Topical Daily   cholecalciferol  2,000 Units Oral Daily   enoxaparin (LOVENOX) injection  40 mg Subcutaneous Daily   loratadine  10 mg Oral Daily   mometasone-formoterol  2 puff Inhalation BID   montelukast  10 mg Oral QHS   pravastatin  80 mg Oral Daily   Continuous Infusions:  sodium chloride 75 mL/hr at 11/03/20 0652   cefTRIAXone (ROCEPHIN)  IV Stopped (11/03/20 9563)     LOS: 0 days    Time spent: over 30 min    Fayrene Helper, MD [facility]   To contact the attending provider between 7A-7P or the covering provider during after hours 7P-7A, please log into the web site [email or web address] and access using universal [place] password for that web site. If you do not have the password, please call the hospital operator.  11/03/2020, 2:50 PM

## 2020-11-03 NOTE — Telephone Encounter (Signed)
Patients daughter called and wanted to let Dr. Jenny Reichmann know that the patient is in the hospital.

## 2020-11-03 NOTE — Progress Notes (Signed)
PHARMACY - PHYSICIAN COMMUNICATION CRITICAL VALUE ALERT - BLOOD CULTURE IDENTIFICATION (BCID)  Angelica [name] is an 79 y.o. female who presented to [facility] on 11/02/2020 with a chief complaint of fever  Assessment: 4/4 GNR, Kleb pna, no KPC, no ESBL  Name of physician (or Provider) Contacted: Ardith Dark  Current antibiotics: cefepime and vancomycin  Changes to prescribed antibiotics recommended:  Start CTX 2gm IV q24h  Results for orders placed or performed during the hospital encounter of 11/02/20  Blood Culture ID Panel (Reflexed) (Collected: 11/02/2020 12:17 PM)  Result Value Ref Range   Enterococcus faecalis NOT DETECTED NOT DETECTED   Enterococcus Faecium NOT DETECTED NOT DETECTED   Listeria monocytogenes NOT DETECTED NOT DETECTED   Staphylococcus species NOT DETECTED NOT DETECTED   Staphylococcus aureus (BCID) NOT DETECTED NOT DETECTED   Staphylococcus epidermidis NOT DETECTED NOT DETECTED   Staphylococcus lugdunensis NOT DETECTED NOT DETECTED   Streptococcus species NOT DETECTED NOT DETECTED   Streptococcus agalactiae NOT DETECTED NOT DETECTED   Streptococcus pneumoniae NOT DETECTED NOT DETECTED   Streptococcus pyogenes NOT DETECTED NOT DETECTED   A.calcoaceticus-baumannii NOT DETECTED NOT DETECTED   Bacteroides fragilis NOT DETECTED NOT DETECTED   Enterobacterales DETECTED (A) NOT DETECTED   Enterobacter cloacae complex NOT DETECTED NOT DETECTED   Escherichia coli NOT DETECTED NOT DETECTED   Klebsiella aerogenes NOT DETECTED NOT DETECTED   Klebsiella oxytoca NOT DETECTED NOT DETECTED   Klebsiella pneumoniae DETECTED (A) NOT DETECTED   Proteus species NOT DETECTED NOT DETECTED   Salmonella species NOT DETECTED NOT DETECTED   Serratia marcescens NOT DETECTED NOT DETECTED   Haemophilus influenzae NOT DETECTED NOT DETECTED   Neisseria meningitidis NOT DETECTED NOT DETECTED   Pseudomonas aeruginosa NOT DETECTED NOT DETECTED   Stenotrophomonas maltophilia NOT DETECTED NOT  DETECTED   Candida albicans NOT DETECTED NOT DETECTED   Candida auris NOT DETECTED NOT DETECTED   Candida glabrata NOT DETECTED NOT DETECTED   Candida krusei NOT DETECTED NOT DETECTED   Candida parapsilosis NOT DETECTED NOT DETECTED   Candida tropicalis NOT DETECTED NOT DETECTED   Cryptococcus neoformans/gattii NOT DETECTED NOT DETECTED   CTX-M ESBL NOT DETECTED NOT DETECTED   Carbapenem resistance IMP NOT DETECTED NOT DETECTED   Carbapenem resistance KPC NOT DETECTED NOT DETECTED   Carbapenem resistance NDM NOT DETECTED NOT DETECTED   Carbapenem resist OXA 48 LIKE NOT DETECTED NOT DETECTED   Carbapenem resistance VIM NOT DETECTED NOT DETECTED    Dolly Rias RPh 11/03/2020, 5:41 AM

## 2020-11-04 ENCOUNTER — Inpatient Hospital Stay: Payer: Medicare HMO | Admitting: Internal Medicine

## 2020-11-04 ENCOUNTER — Inpatient Hospital Stay (HOSPITAL_COMMUNITY): Payer: Medicare HMO

## 2020-11-04 DIAGNOSIS — R7881 Bacteremia: Secondary | ICD-10-CM | POA: Diagnosis not present

## 2020-11-04 DIAGNOSIS — R509 Fever, unspecified: Secondary | ICD-10-CM | POA: Diagnosis not present

## 2020-11-04 DIAGNOSIS — B961 Klebsiella pneumoniae [K. pneumoniae] as the cause of diseases classified elsewhere: Secondary | ICD-10-CM

## 2020-11-04 LAB — CBC WITH DIFFERENTIAL/PLATELET
Abs Immature Granulocytes: 0.06 10*3/uL (ref 0.00–0.07)
Basophils Absolute: 0 10*3/uL (ref 0.0–0.1)
Basophils Relative: 0 %
Eosinophils Absolute: 0 10*3/uL (ref 0.0–0.5)
Eosinophils Relative: 0 %
HCT: 28.7 % — ABNORMAL LOW (ref 36.0–46.0)
Hemoglobin: 9.7 g/dL — ABNORMAL LOW (ref 12.0–15.0)
Immature Granulocytes: 1 %
Lymphocytes Relative: 10 %
Lymphs Abs: 1.2 10*3/uL (ref 0.7–4.0)
MCH: 31.2 pg (ref 26.0–34.0)
MCHC: 33.8 g/dL (ref 30.0–36.0)
MCV: 92.3 fL (ref 80.0–100.0)
Monocytes Absolute: 0.7 10*3/uL (ref 0.1–1.0)
Monocytes Relative: 6 %
Neutro Abs: 10.5 10*3/uL — ABNORMAL HIGH (ref 1.7–7.7)
Neutrophils Relative %: 83 %
Platelets: 183 10*3/uL (ref 150–400)
RBC: 3.11 MIL/uL — ABNORMAL LOW (ref 3.87–5.11)
RDW: 13.3 % (ref 11.5–15.5)
WBC: 12.6 10*3/uL — ABNORMAL HIGH (ref 4.0–10.5)
nRBC: 0 % (ref 0.0–0.2)

## 2020-11-04 LAB — URINE CULTURE

## 2020-11-04 LAB — COMPREHENSIVE METABOLIC PANEL
ALT: 11 U/L (ref 0–44)
AST: 13 U/L — ABNORMAL LOW (ref 15–41)
Albumin: 2.5 g/dL — ABNORMAL LOW (ref 3.5–5.0)
Alkaline Phosphatase: 49 U/L (ref 38–126)
Anion gap: 7 (ref 5–15)
BUN: 11 mg/dL (ref 8–23)
CO2: 21 mmol/L — ABNORMAL LOW (ref 22–32)
Calcium: 8.5 mg/dL — ABNORMAL LOW (ref 8.9–10.3)
Chloride: 111 mmol/L (ref 98–111)
Creatinine, Ser: 0.77 mg/dL (ref 0.44–1.00)
GFR, Estimated: >60 mL/min (ref >60)
Glucose, Bld: 84 mg/dL (ref 70–99)
Potassium: 3.5 mmol/L (ref 3.5–5.1)
Sodium: 139 mmol/L (ref 135–145)
Total Bilirubin: 0.2 mg/dL — ABNORMAL LOW (ref 0.3–1.2)
Total Protein: 5.7 g/dL — ABNORMAL LOW (ref 6.5–8.1)

## 2020-11-04 LAB — MAGNESIUM: Magnesium: 1.9 mg/dL (ref 1.7–2.4)

## 2020-11-04 IMAGING — CT CT CHEST W/ CM
2 of 4 series · 15 of 36 positions shown, 18 images · IV contrast (omnipaque)
Comparison: CT chest [DATE].

CLINICAL DATA: Non-small cell lung cancer on immunotherapy. Patient
with fever.

EXAM:
CT CHEST WITH CONTRAST
TECHNIQUE: Multidetector CT imaging of the chest was performed during
intravenous contrast administration.
CONTRAST:  75mL OMNIPAQUE IOHEXOL 300 MG/ML  SOLN

[Series 3: axial st · axial · 0.68mm/px · z∈[-482,-222]mm · 12 of 154 slices shown, 15 images]
[im 12/154  mediastinal]
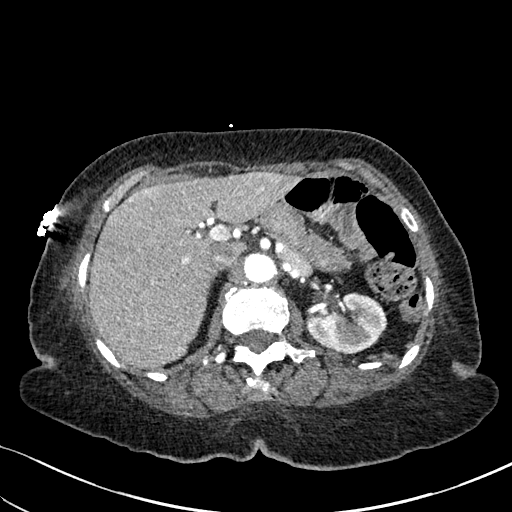
[im 12/154  lung]
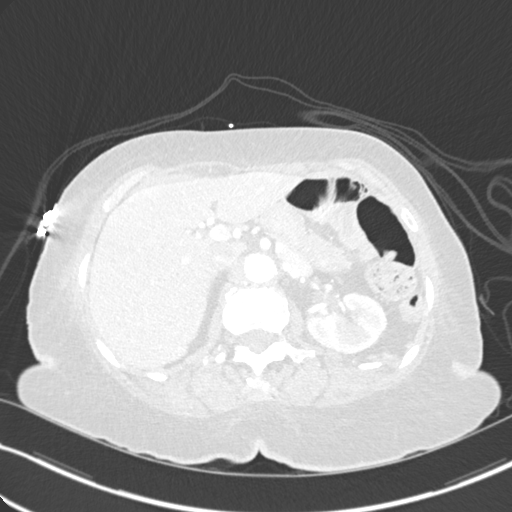
[im 24/154  lung]
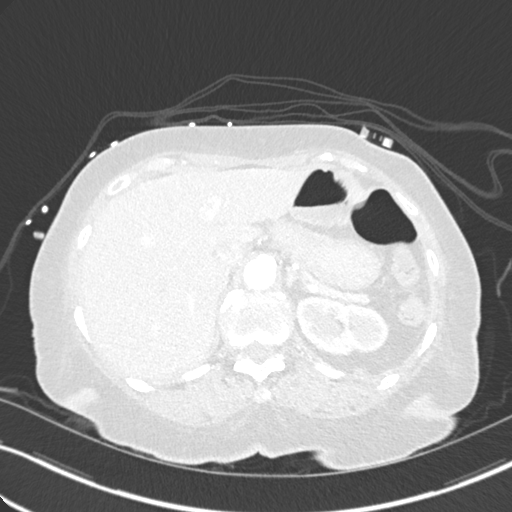
[im 36/154  lung]
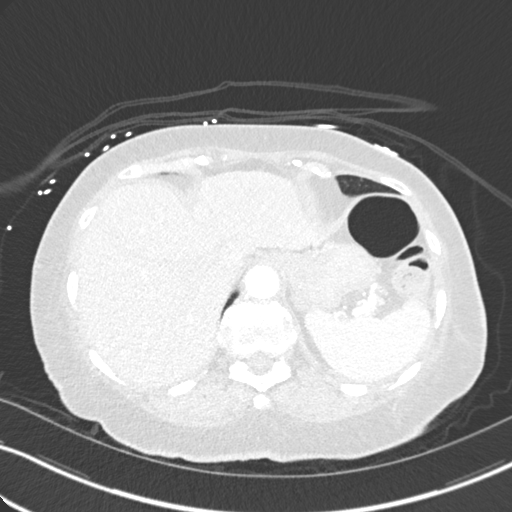
[im 48/154  lung]
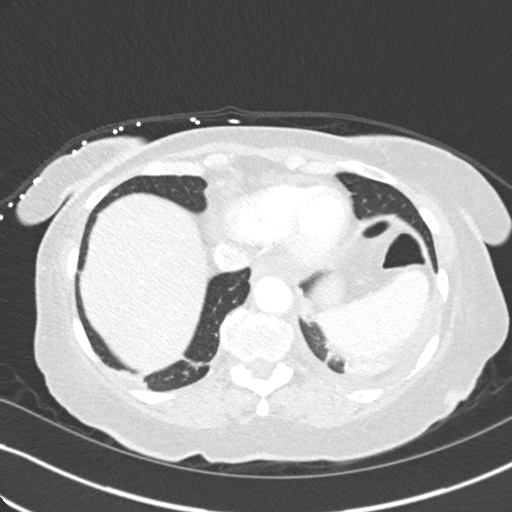
[im 59/154  mediastinal]
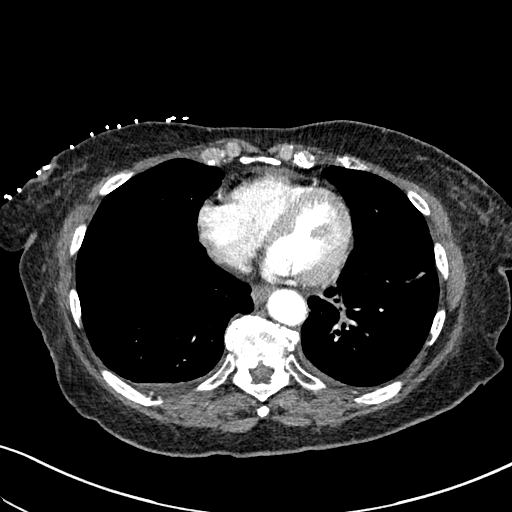
[im 59/154  lung]
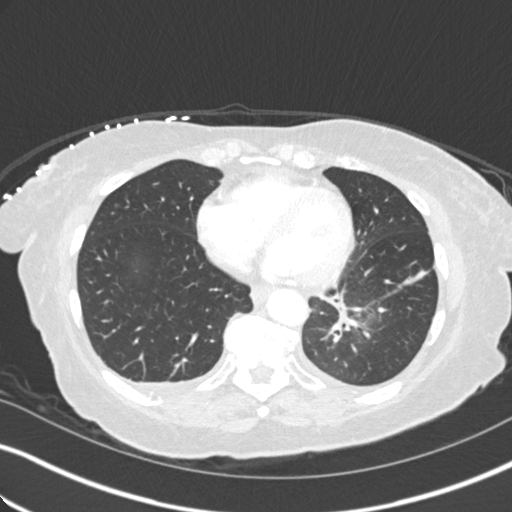
[im 71/154  lung]
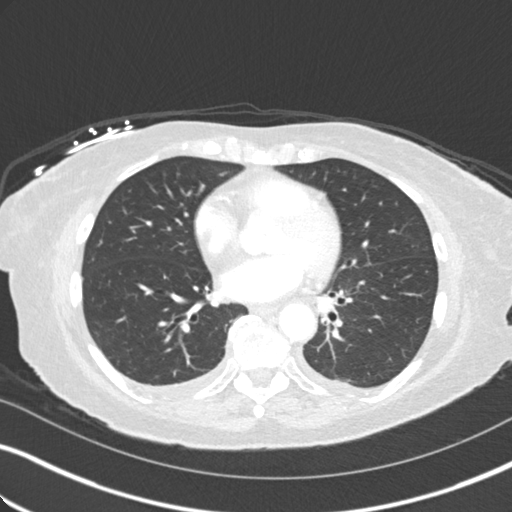
[im 83/154  lung]
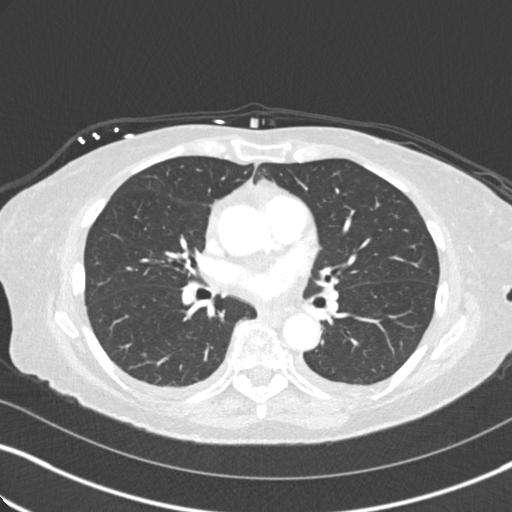
[im 95/154  lung]
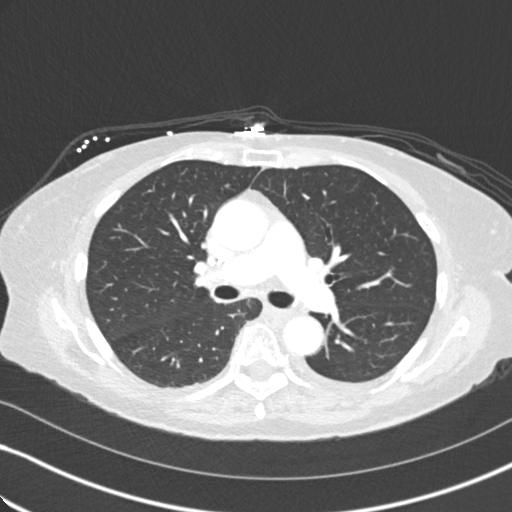
[im 106/154  mediastinal]
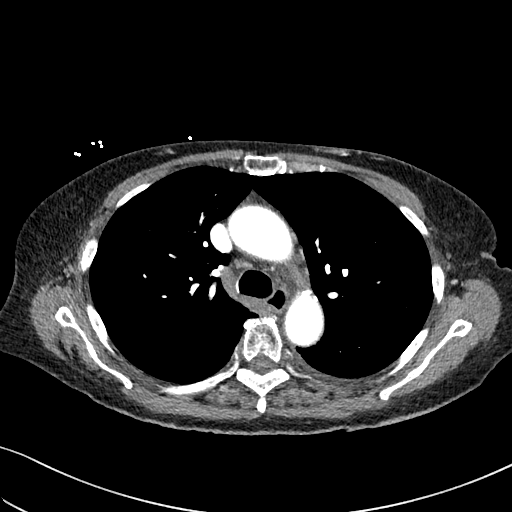
[im 106/154  lung]
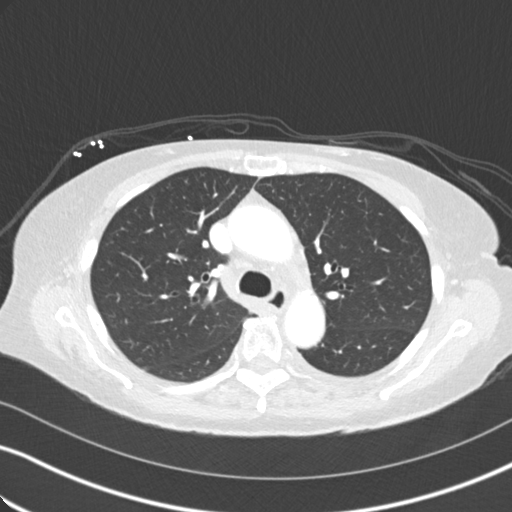
[im 118/154  lung]
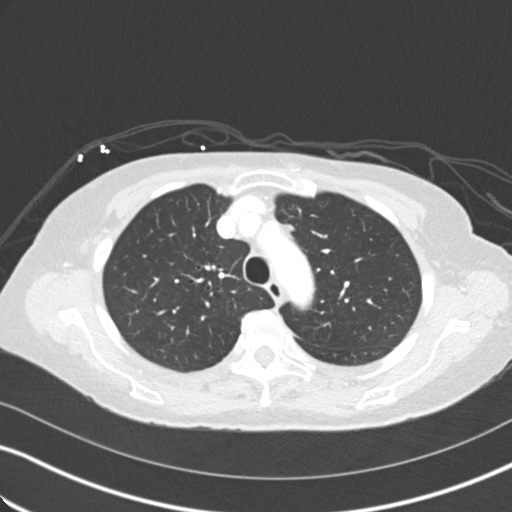
[im 130/154  lung]
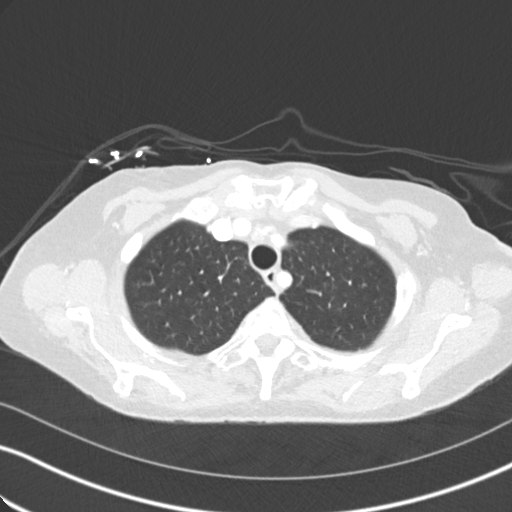
[im 142/154  lung]
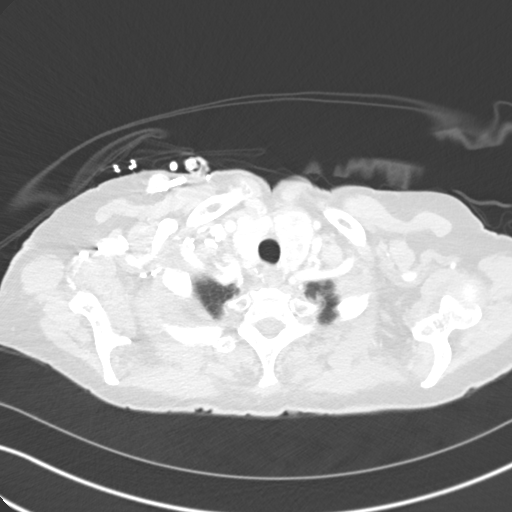

[Series 6: coronal · coronal · 0.60mm/px · 3 of 114 slices shown]
[im 23/114  lung]
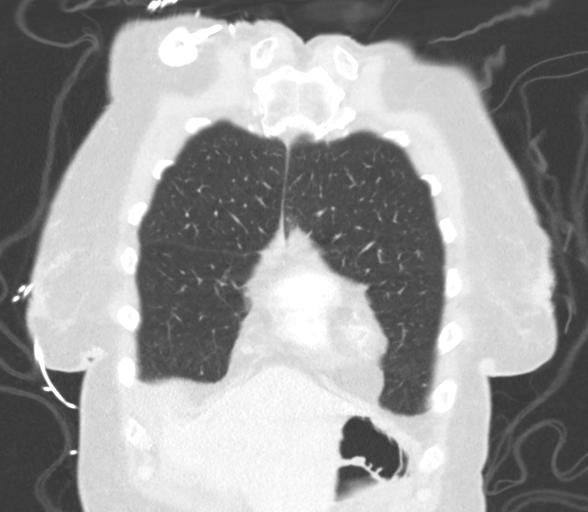
[im 46/114  lung]
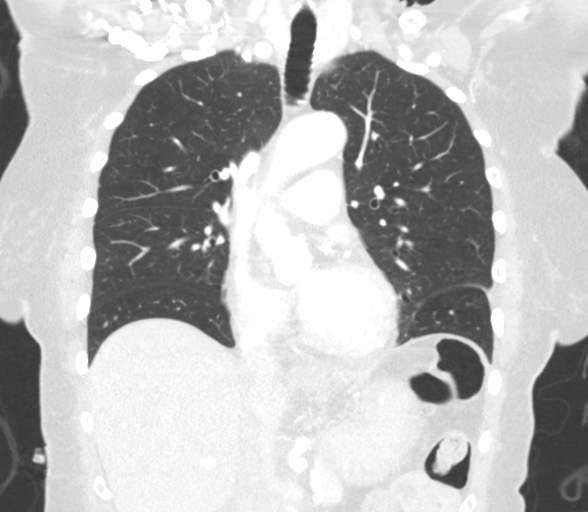
[im 68/114  lung]
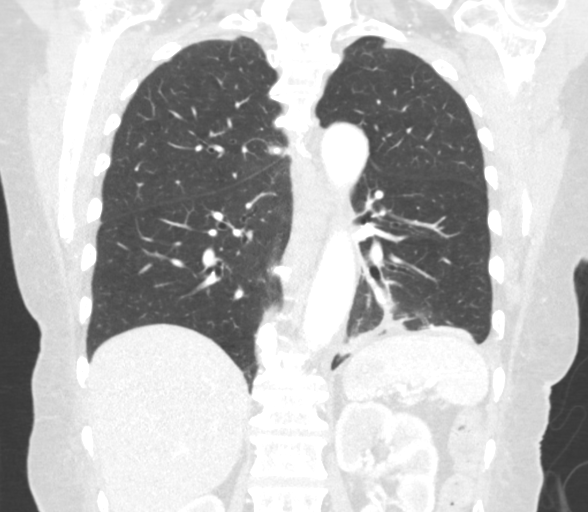

[15 of 36 positions shown; findings below may reference images not displayed]

FINDINGS: Cardiovascular: Right anterior chest wall Port-A-Cath is present
with tip terminating in the superior vena cava. Normal heart size.
Coronary arterial vascular calcifications. Trace fluid superior
pericardial recess. Thoracic aortic vascular calcifications. There
is a possible small filling defect within a distal subsegmental
right lower lobe pulmonary artery (image 82; series 3).

Mediastinum/Nodes: No enlarged axillary, mediastinal or hilar
lymphadenopathy. Normal appearance of the esophagus. Persistent
enlarged heterogeneous thyroid.

Lungs/Pleura: Central airways are patent. Continued interval
decrease in size of left lower lobe nodule measuring 1.6 x 1.7 cm
(image 98; series 8), previously 2.1 x 2.2 cm. Slight interval
increase in patchy consolidative opacities within the more
peripheral left lower lobe. Interval development of small bilateral
pleural effusions. Minimal right basilar atelectasis. No
pneumothorax. Stable 3 mm peripheral right middle lobe nodule (image
74; series 8).

Upper Abdomen: No acute process.

Musculoskeletal: Thoracic spine degenerative changes. No aggressive
or acute appearing osseous lesions.
IMPRESSION: 1. Suggestion of a small filling defect within a peripheral right
lower lobe subsegmental pulmonary artery, potentially representing
distal pulmonary embolus versus artifact. If clinically warranted,
consider further evaluation with PE CT.
2. Interval decrease in size of left lower lobe nodule.
3. Interval development of patchy consolidative opacities within the
left lower lobe which may represent atelectasis or infection.
4. Small bilateral pleural effusions.
5. Enlarged heterogeneous thyroid. Recommend thyroid ultrasound if
and when clinically appropriate in the setting of metastatic
malignancy.
6. Stable 3 mm nodule peripheral right middle lobe. Recommend
continued attention on follow-up.
7. These results were called by telephone at the time of
interpretation on [DATE] at [DATE] to provider NYA , who
verbally acknowledged these results.

## 2020-11-04 MED ORDER — SODIUM CHLORIDE 0.9% FLUSH
10.0000 mL | Freq: Two times a day (BID) | INTRAVENOUS | Status: DC
  Administered 2020-11-05 – 2020-11-06 (×3): 10 mL

## 2020-11-04 MED ORDER — IOHEXOL 300 MG/ML  SOLN
75.0000 mL | Freq: Once | INTRAMUSCULAR | Status: AC | PRN
  Administered 2020-11-04: 75 mL via INTRAVENOUS

## 2020-11-04 MED ORDER — SODIUM CHLORIDE 0.9% FLUSH: 10.0000 mL | INTRAVENOUS | Status: DC | PRN

## 2020-11-04 MED ORDER — SODIUM CHLORIDE (PF) 0.9 % IJ SOLN
INTRAMUSCULAR | Status: AC
  Administered 2020-11-04: 10 mL
  Filled 2020-11-04: qty 50

## 2020-11-04 NOTE — Plan of Care (Signed)
Pt had a very comfortably night; sleeping through the night; no s/s of acute distress or pain reported or observed; call light within reach and bed at lowest position for safety.

## 2020-11-04 NOTE — Progress Notes (Signed)
PROGRESS NOTE    [name]  YWV:[id number] DOB: [birth year] DOA: 11/02/2020 PCP: [name], MD  Chief Complaint  Patient presents with   Fever   Brief Narrative:  Angelica [name] is Angelica [name] 80 y.o. 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 Earlie Server, medical oncology, essential hypertension, chronic abdominal pain who was getting her immunotherapy at oncology clinic today and developed fever 130.  No paraspinous was called and she was subsequently sent to the emergency department.  Per ED reports, she continued to have persistent fever, her Tmax has been 102.9 here.Patient herself does not have any other complaint such as chest pain, shortness of breath, nausea, vomiting, any problem with urination or bowel movements, cough, shortness of breath or any other complaint.   ED Course: Upon arrival to ED, her temperature was 102.9, she was slightly tachycardic.  Mild leukocytosis on CBC.  Normal CMP and normal lactic acid.  No pathology on the chest x-ray.  ED physician discussed the case with patient's primary oncologist Dr. [name] who opined that patient likely had fever secondary to immunotherapy/chemotherapy however he recommended keeping in the hospital for observation and continued antibiotics and ruling out any underlying infection.  Patient received cefepime and vancomycin in the ED hospital service were consulted to admit the patient for further management.    Assessment & Plan:   Active Problems:   SIRS (systemic inflammatory response syndrome) (HCC)   Bacteremia due to Klebsiella pneumoniae   Fever  Sepsis 2/2 Klebsiella Pneumoniae Bacteremia: Patient meets SIRS criteria based off of fever, tachycardia and leukocytosis.   Transitioned to ceftriaxone 6/16 - present Blood cultures with klebsiella, pending sensitivities Urine culture pending (UA not c/w UTI) Unclear etiology of bacteremia, often UTI, though UA not c/w  this.     Leukocytosis 2/2 steroids and infection  Essential hypertension: continue to hold BP meds   Non-small cell lung carcinoma: Follows with Dr. [name] as outpatient.  S/p immunotherapy 6/15 (also had dose of steroids).  Further management per oncology.  Asthma dulera Albuterol prn Singulair  Shoulder Pain  Add voltaren Tramadol prn  DVT prophylaxis: lovenox Code Status: full  Family Communication: daughter over phone Disposition:   Status is: Observation  The patient will require care spanning > 2 midnights and should be moved to inpatient because: Inpatient level of care appropriate due to severity of illness  Dispo: The patient is from: Home              Anticipated d/c is to: Home              Patient currently is not medically stable to d/c.   Difficult to place patient No       Consultants:  none  Procedures:  none  Antimicrobials:  Anti-infectives (From admission, onward)    Start     Dose/Rate Route Frequency Ordered Stop   11/03/20 1400  vancomycin (VANCOCIN) IVPB 1000 mg/200 mL premix  Status:  Discontinued        1,000 mg 200 mL/hr over 60 Minutes Intravenous Every 24 hours 11/02/20 1631 11/03/20 0831   11/03/20 0630  cefTRIAXone (ROCEPHIN) 2 g in sodium chloride 0.9 % 100 mL IVPB        2 g 200 mL/hr over 30 Minutes Intravenous Every 24 hours 11/03/20 0537     11/02/20 2200  ceFEPIme (MAXIPIME) 2 g in sodium chloride 0.9 % 100 mL IVPB  Status:  Discontinued  2 g 200 mL/hr over 30 Minutes Intravenous Every 12 hours 11/02/20 1626 11/03/20 0537   11/02/20 1400  vancomycin (VANCOREADY) IVPB 1250 mg/250 mL        1,250 mg 166.7 mL/hr over 90 Minutes Intravenous  Once 11/02/20 1331 11/02/20 1658   11/02/20 1330  ceFEPIme (MAXIPIME) 2 g in sodium chloride 0.9 % 100 mL IVPB        2 g 200 mL/hr over 30 Minutes Intravenous  Once 11/02/20 1320 11/02/20 1530   11/02/20 1330  metroNIDAZOLE (FLAGYL) IVPB 500 mg        500 mg 100 mL/hr over 60  Minutes Intravenous  Once 11/02/20 1320 11/02/20 1530   11/02/20 1330  vancomycin (VANCOCIN) IVPB 1000 mg/200 mL premix  Status:  Discontinued        1,000 mg 200 mL/hr over 60 Minutes Intravenous  Once 11/02/20 1320 11/02/20 1329         Subjective: Feeling better  Objective: Vitals:   11/03/20 2005 11/04/20 0443 11/04/20 0753 11/04/20 1335  BP: 101/61 117/78  124/72  Pulse: 75 71  85  Resp: 16 16  18   Temp: 98 F (36.7 C) 98.3 F (36.8 C)  98.3 F (36.8 C)  TempSrc: Oral Oral  Oral  SpO2: 100% 99% 98% 100%  Weight:      Height:        Intake/Output Summary (Last 24 hours) at 11/04/2020 1503 Last data filed at 11/04/2020 1355 Gross per 24 hour  Intake 480 ml  Output --  Net 480 ml   Filed Weights   11/02/20 1159  Weight: 66.9 kg    Examination:  General: No acute distress. Cardiovascular: Heart sounds show Consuella Scurlock regular rate, and rhythm.. Lungs: Clear to auscultation bilaterally  Abdomen: Soft, nontender, nondistended  Neurological: Alert and oriented 3. Moves all extremities 4 . Cranial nerves II through XII grossly intact. Skin: Warm and dry. No rashes or lesions. Extremities: No clubbing or cyanosis. No edema.     Data Reviewed: I have personally reviewed following labs and imaging studies  CBC: Recent Labs  Lab 11/02/20 0818 11/02/20 1217 11/03/20 0428 11/04/20 0514  WBC 7.1 13.0* 22.3* 12.6*  NEUTROABS 5.3 12.6*  --  10.5*  HGB 11.1* 10.5* 10.2* 9.7*  HCT 33.2* 31.6* 30.7* 28.7*  MCV 90.5 92.1 92.7 92.3  PLT 220 192 191 662    Basic Metabolic Panel: Recent Labs  Lab 11/02/20 0818 11/02/20 1217 11/03/20 0428 11/04/20 0514  NA 136 135 138 139  K 4.2 3.6 3.7 3.5  CL 104 106 110 111  CO2 22 23 21* 21*  GLUCOSE 94 125* 143* 84  BUN 11 12 13 11   CREATININE 0.77 0.76 0.85 0.77  CALCIUM 9.3 8.4* 8.7* 8.5*  MG  --  1.6*  --  1.9  PHOS  --   --  3.5  --     GFR: Estimated Creatinine Clearance: 50.5 mL/min (by C-G formula based on SCr  of 0.77 mg/dL).  Liver Function Tests: Recent Labs  Lab 11/02/20 0818 11/02/20 1217 11/04/20 0514  AST 18 19 13*  ALT 9 11 11   ALKPHOS 70 59 49  BILITOT 0.3 0.4 0.2*  PROT 7.1 6.4* 5.7*  ALBUMIN 3.1* 3.1* 2.5*    CBG: No results for input(s): GLUCAP in the last 168 hours.   Recent Results (from the past 240 hour(s))  Blood Culture (routine x 2)     Status: Abnormal (Preliminary result)   Collection Time: 11/02/20  12:17 PM   Specimen: BLOOD  Result Value Ref Range Status   Specimen Description   Final    BLOOD RIGHT ANTECUBITAL Performed at [facility] [address], [place], [place] [address]    Special Requests   Final    BOTTLES DRAWN AEROBIC AND ANAEROBIC Blood Culture results may not be optimal due to an inadequate volume of blood received in culture bottles Performed at [facility] [address] [place], [place] [address]    Culture  Setup Time   Final    GRAM NEGATIVE RODS IN BOTH AEROBIC AND ANAEROBIC BOTTLES CRITICAL VALUE NOTED.  VALUE IS CONSISTENT WITH PREVIOUSLY REPORTED AND CALLED VALUE. Performed at [facility], [place] [address] [place], [place] [address]    Culture KLEBSIELLA PNEUMONIAE (Pearly Ap[place]ito)  Final   Report Status PENDING  Incomplete  Blood Culture (routine x 2)     Status: Abnormal (Preliminary result)   Collection Time: 11/02/20 12:17 PM   Specimen: BLOOD  Result Value Ref Range Status   Specimen Description   Final    BLOOD LEFT ANTECUBITAL Performed at [facility] [address] [place], [place] [address]    Special Requests   Final    BOTTLES DRAWN AEROBIC AND ANAEROBIC Blood Culture results may not be optimal due to an inadequate volume of blood received in culture bottles Performed at [facility] [address], [place], [place] [address]    Culture  Setup Time   Final    GRAM NEGATIVE RODS IN BOTH AEROBIC AND ANAEROBIC BOTTLES CRITICAL RESULT  CALLED TO, READ BACK BY AND VERIFIED WITH: PHARMD R JACKSON [phone] AT 530 AM    Culture (Dang Mathison)  Final    KLEBSIELLA PNEUMONIAE SUSCEPTIBILITIES TO FOLLOW Performed at [facility], [place] [address] [place], [place] [address]    Report Status PENDING  Incomplete  Blood Culture ID Panel (Reflexed)     Status: Abnormal   Collection Time: 11/02/20 12:17 PM  Result Value Ref Range Status   Enterococcus faecalis NOT DETECTED NOT DETECTED Final   Enterococcus Faecium NOT DETECTED NOT DETECTED Final   Listeria monocytogenes NOT DETECTED NOT DETECTED Final   Staphylococcus species NOT DETECTED NOT DETECTED Final   Staphylococcus aureus (BCID) NOT DETECTED NOT DETECTED Final   Staphylococcus epidermidis NOT DETECTED NOT DETECTED Final   Staphylococcus lugdunensis NOT DETECTED NOT DETECTED Final   Streptococcus species NOT DETECTED NOT DETECTED Final   Streptococcus agalactiae NOT DETECTED NOT DETECTED Final   Streptococcus pneumoniae NOT DETECTED NOT DETECTED Final   Streptococcus pyogenes NOT DETECTED NOT DETECTED Final   Zya Finkle.calcoaceticus-baumannii NOT DETECTED NOT DETECTED Final   Bacteroides fragilis NOT DETECTED NOT DETECTED Final   Enterobacterales DETECTED (Clarisse Rodriges) NOT DETECTED Final    Comment: Enterobacterales represent Naziah Weckerly large order of gram negative bacteria, not Jaskirat Zertuche single organism. CRITICAL RESULT CALLED TO, READ BACK BY AND VERIFIED WITH: Seleta Rhymes PHARMD, AT [phone] 11/03/20 D. VANHOOK    Enterobacter cloacae complex NOT DETECTED NOT DETECTED Final   Escherichia coli NOT DETECTED NOT DETECTED Final   Klebsiella aerogenes NOT DETECTED NOT DETECTED Final   Klebsiella oxytoca NOT DETECTED NOT DETECTED Final   Klebsiella pneumoniae DETECTED (Davianna Deutschman) NOT DETECTED Final    Comment: CRITICAL RESULT CALLED TO, READ BACK BY AND VERIFIED WITHSeleta Rhymes PHARMD, AT 0453 11/03/20 D. VANHOOK    Proteus species NOT DETECTED NOT DETECTED Final   Salmonella species NOT DETECTED NOT  DETECTED Final    Serratia marcescens NOT DETECTED NOT DETECTED Final   Haemophilus influenzae NOT DETECTED NOT DETECTED Final   Neisseria meningitidis NOT DETECTED NOT DETECTED Final   Pseudomonas aeruginosa NOT DETECTED NOT DETECTED Final   Stenotrophomonas maltophilia NOT DETECTED NOT DETECTED Final   Candida albicans NOT DETECTED NOT DETECTED Final   Candida auris NOT DETECTED NOT DETECTED Final   Candida glabrata NOT DETECTED NOT DETECTED Final   Candida krusei NOT DETECTED NOT DETECTED Final   Candida parapsilosis NOT DETECTED NOT DETECTED Final   Candida tropicalis NOT DETECTED NOT DETECTED Final   Cryptococcus neoformans/gattii NOT DETECTED NOT DETECTED Final   CTX-M ESBL NOT DETECTED NOT DETECTED Final   Carbapenem resistance IMP NOT DETECTED NOT DETECTED Final   Carbapenem resistance KPC NOT DETECTED NOT DETECTED Final   Carbapenem resistance NDM NOT DETECTED NOT DETECTED Final   Carbapenem resist OXA 48 LIKE NOT DETECTED NOT DETECTED Final   Carbapenem resistance VIM NOT DETECTED NOT DETECTED Final    Comment: Performed at [facility], [address], [place], [place] [address]  Resp Panel by RT-PCR (Flu Mykaylah Ballman&B, Covid) Nasopharyngeal Swab     Status: None   Collection Time: 11/02/20  1:10 PM   Specimen: Nasopharyngeal Swab; Nasopharyngeal(NP) swabs in vial transport medium  Result Value Ref Range Status   SARS Coronavirus 2 by RT PCR NEGATIVE NEGATIVE Final    Comment: (NOTE) SARS-CoV-2 target nucleic acids are NOT DETECTED.  The SARS-CoV-2 RNA is generally detectable in upper respiratory specimens during the acute phase of infection. The lowest concentration of SARS-CoV-2 viral copies this assay can detect is 138 copies/mL. Logon Ut[employer] negative result does not preclude SARS-Cov-2 infection and should not be used as the sole basis for treatment or other patient management decisions. Bobbye Petti negative result may occur with  improper specimen collection/handling, submission of specimen other than  nasopharyngeal swab, presence of viral mutation(s) within the areas targeted by this assay, and inadequate number of viral copies(<138 copies/mL). Dejean Tribby negative result must be combined with clinical observations, patient history, and epidemiological information. The expected result is Negative.  Fact Sheet for Patients:  EntrepreneurPulse.com.au  Fact Sheet for Healthcare Providers:  IncredibleEmployment.be  This test is no t yet approved or cleared by the [place] FDA and  has been authorized for detection and/or diagnosis of SARS-CoV-2 by FDA under an Emergency Use Authorization (EUA). This EUA will remain  in effect (meaning this test can be used) for the duration of the COVID-19 declaration under Section 564(b)(1) of the Act, 21 U.S.C.section 360bbb-3(b)(1), unless the authorization is terminated  or revoked sooner.       Influenza Calieb Lichtman by PCR NEGATIVE NEGATIVE Final   Influenza B by PCR NEGATIVE NEGATIVE Final    Comment: (NOTE) The Xpert Xpress SARS-CoV-2/FLU/RSV plus assay is intended as an aid in the diagnosis of influenza from Nasopharyngeal swab specimens and should not be used as Jeral Zick sole basis for treatment. Nasal washings and aspirates are unacceptable for Xpert Xpress SARS-CoV-2/FLU/RSV testing.  Fact Sheet for Patients: EntrepreneurPulse.com.au  Fact Sheet for Healthcare Providers: IncredibleEmployment.be  This test is not yet approved or cleared by the [place] FDA and has been authorized for detection and/or diagnosis of SARS-CoV-2 by FDA under an Emergency Use Authorization (EUA). This EUA will remain in effect (meaning this test can be used) for the duration of the COVID-19 declaration under Section 564(b)(1) of the Act, 21 U.S.C. section 360bbb-3(b)(1), unless the authorization is terminated or revoked.  Performed at [facility], [place] [address], [place], [place] [address]   Urine culture     Status: Abnormal   Collection Time: 11/02/20  3:35 PM   Specimen: In/Out Cath Urine  Result Value Ref Range Status   Specimen Description   Final    IN/OUT CATH URINE Performed at [facility] [address], [place], [place] [address]    Special Requests   Final    NONE Performed at [facility], [place] [address], [place], [place] [address]    Culture MULTIPLE SPECIES PRESENT, SUGGEST RECOLLECTION (Camrynn Mcclintic)  Final   Report Status 11/04/2020 FINAL  Final         Radiology Studies: No results found.      Scheduled Meds:  acetaminophen  650 mg Oral Once   Chlorhexidine Gluconate Cloth  6 each Topical Daily   cholecalciferol  2,000 Units Oral Daily   enoxaparin (LOVENOX) injection  40 mg Subcutaneous Daily   loratadine  10 mg Oral Daily   mometasone-formoterol  2 puff Inhalation BID   montelukast  10 mg Oral QHS   pravastatin  80 mg Oral Daily   Continuous Infusions:  sodium chloride 75 mL (11/04/20 0531)   cefTRIAXone (ROCEPHIN)  IV 2 g (11/04/20 0527)     LOS: 1 day    Time spent: over 30 min    Fayrene Helper, MD [facility]   To contact the attending provider between 7A-7P or the covering provider during after hours 7P-7A, please log into the web site [email or web address] and access using universal [place] password for that web site. If you do not have the password, please call the hospital operator.  11/04/2020, 3:03 PM

## 2020-11-05 ENCOUNTER — Inpatient Hospital Stay (HOSPITAL_COMMUNITY): Payer: Medicare HMO

## 2020-11-05 ENCOUNTER — Encounter (HOSPITAL_COMMUNITY): Payer: Self-pay | Admitting: Family Medicine

## 2020-11-05 DIAGNOSIS — M7989 Other specified soft tissue disorders: Secondary | ICD-10-CM

## 2020-11-05 DIAGNOSIS — B961 Klebsiella pneumoniae [K. pneumoniae] as the cause of diseases classified elsewhere: Secondary | ICD-10-CM | POA: Diagnosis not present

## 2020-11-05 DIAGNOSIS — R7881 Bacteremia: Secondary | ICD-10-CM | POA: Diagnosis not present

## 2020-11-05 LAB — COMPREHENSIVE METABOLIC PANEL
ALT: 10 U/L (ref 0–44)
AST: 14 U/L — ABNORMAL LOW (ref 15–41)
Albumin: 2.7 g/dL — ABNORMAL LOW (ref 3.5–5.0)
Alkaline Phosphatase: 47 U/L (ref 38–126)
Anion gap: 6 (ref 5–15)
BUN: 7 mg/dL — ABNORMAL LOW (ref 8–23)
CO2: 25 mmol/L (ref 22–32)
Calcium: 8.5 mg/dL — ABNORMAL LOW (ref 8.9–10.3)
Chloride: 108 mmol/L (ref 98–111)
Creatinine, Ser: 0.6 mg/dL (ref 0.44–1.00)
GFR, Estimated: >60 mL/min (ref >60)
Glucose, Bld: 87 mg/dL (ref 70–99)
Potassium: 3.2 mmol/L — ABNORMAL LOW (ref 3.5–5.1)
Sodium: 139 mmol/L (ref 135–145)
Total Bilirubin: 0.2 mg/dL — ABNORMAL LOW (ref 0.3–1.2)
Total Protein: 5.7 g/dL — ABNORMAL LOW (ref 6.5–8.1)

## 2020-11-05 LAB — CBC WITH DIFFERENTIAL/PLATELET
Abs Immature Granulocytes: 0.03 10*3/uL (ref 0.00–0.07)
Basophils Absolute: 0 10*3/uL (ref 0.0–0.1)
Basophils Relative: 0 %
Eosinophils Absolute: 0 10*3/uL (ref 0.0–0.5)
Eosinophils Relative: 0 %
HCT: 31.5 % — ABNORMAL LOW (ref 36.0–46.0)
Hemoglobin: 10.4 g/dL — ABNORMAL LOW (ref 12.0–15.0)
Immature Granulocytes: 1 %
Lymphocytes Relative: 14 %
Lymphs Abs: 0.9 10*3/uL (ref 0.7–4.0)
MCH: 30.4 pg (ref 26.0–34.0)
MCHC: 33 g/dL (ref 30.0–36.0)
MCV: 92.1 fL (ref 80.0–100.0)
Monocytes Absolute: 0.6 10*3/uL (ref 0.1–1.0)
Monocytes Relative: 10 %
Neutro Abs: 4.8 10*3/uL (ref 1.7–7.7)
Neutrophils Relative %: 75 %
Platelets: 220 10*3/uL (ref 150–400)
RBC: 3.42 MIL/uL — ABNORMAL LOW (ref 3.87–5.11)
RDW: 13.2 % (ref 11.5–15.5)
WBC: 6.4 10*3/uL (ref 4.0–10.5)
nRBC: 0 % (ref 0.0–0.2)

## 2020-11-05 LAB — CULTURE, BLOOD (ROUTINE X 2)

## 2020-11-05 LAB — MAGNESIUM: Magnesium: 1.5 mg/dL — ABNORMAL LOW (ref 1.7–2.4)

## 2020-11-05 LAB — PHOSPHORUS: Phosphorus: 2.8 mg/dL (ref 2.5–4.6)

## 2020-11-05 IMAGING — CT CT ANGIO CHEST
3 of 7 series · 19 of 46 positions shown · IV contrast (OMNIPAQUE)
Comparison: Routine chest CT on [DATE]

CLINICAL DATA: Positive D-dimer. Non-small cell lung carcinoma
undergoing immunotherapy.

EXAM:
CT ANGIOGRAPHY CHEST WITH CONTRAST
TECHNIQUE: Multidetector CT imaging of the chest was performed using the
standard protocol during bolus administration of intravenous
contrast. Multiplanar CT image reconstructions and MIPs were
obtained to evaluate the vascular anatomy.
CONTRAST:  80mL OMNIPAQUE IOHEXOL 350 MG/ML SOLN

[Series 5: thins · axial · 0.72mm/px · z∈[-285,-75]mm · 15 of 241 slices shown]
[im 16/241  lung]
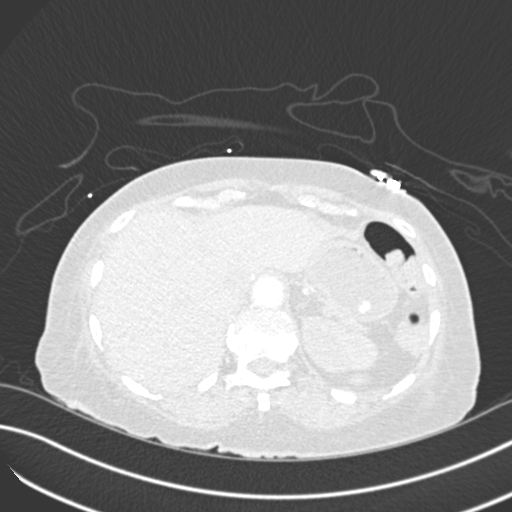
[im 31/241  soft-tissue]
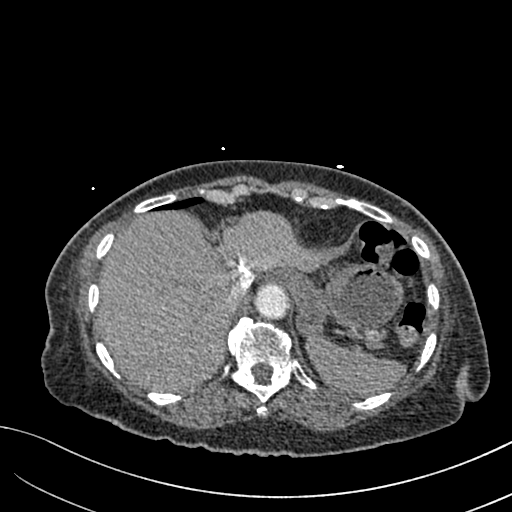
[im 46/241  lung]
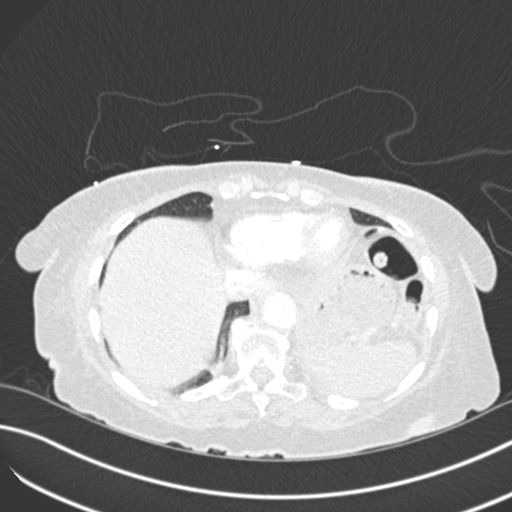
[im 61/241  soft-tissue]
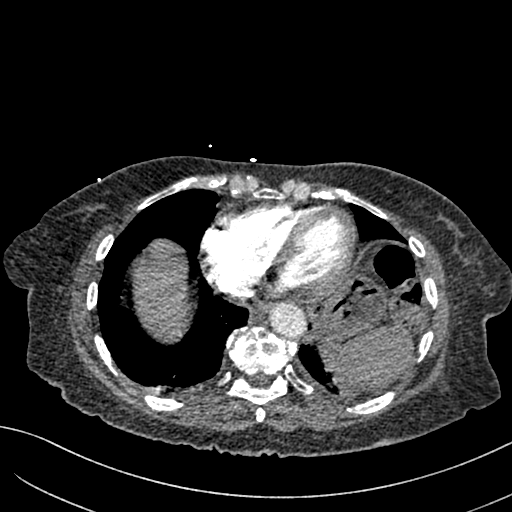
[im 76/241  lung]
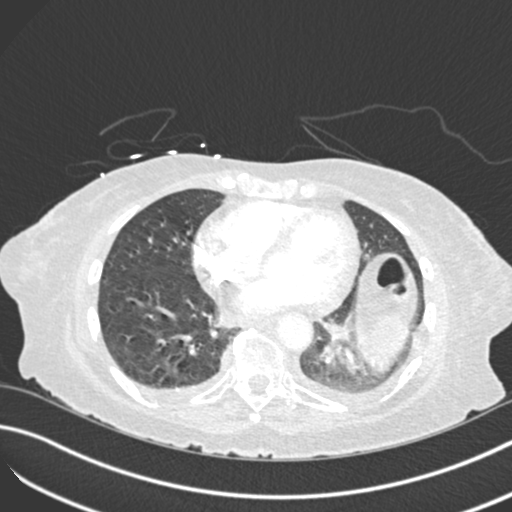
[im 91/241  soft-tissue]
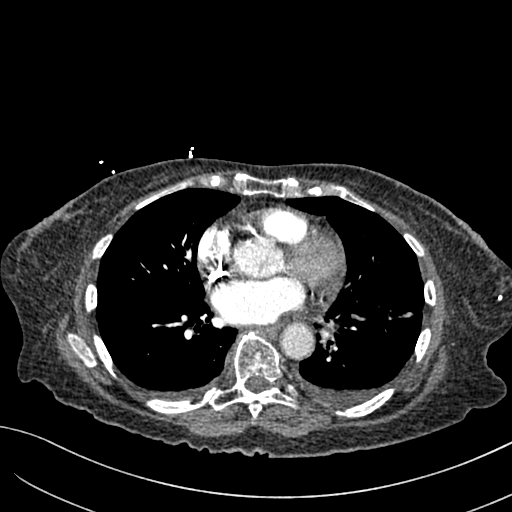
[im 106/241  lung]
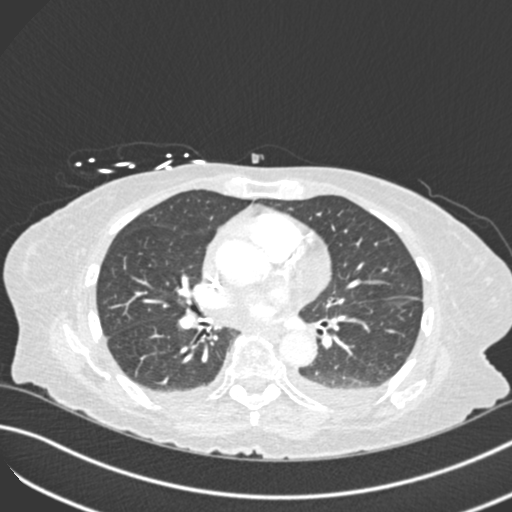
[im 121/241  soft-tissue]
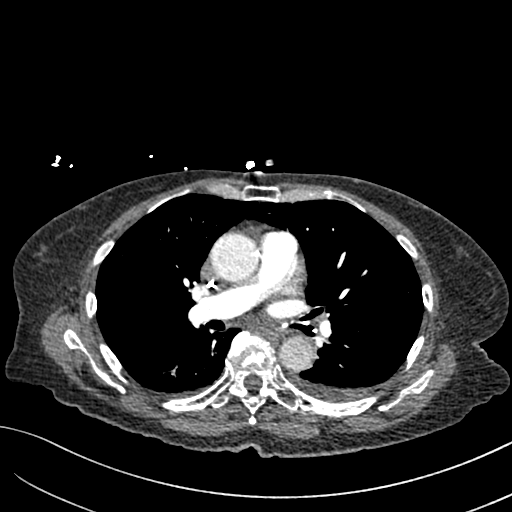
[im 136/241  lung]
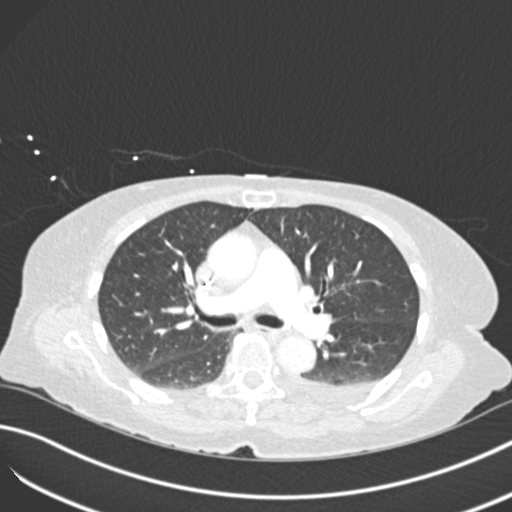
[im 151/241  soft-tissue]
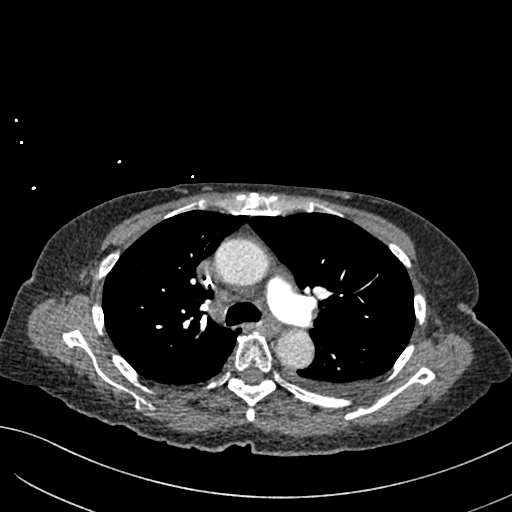
[im 166/241  lung]
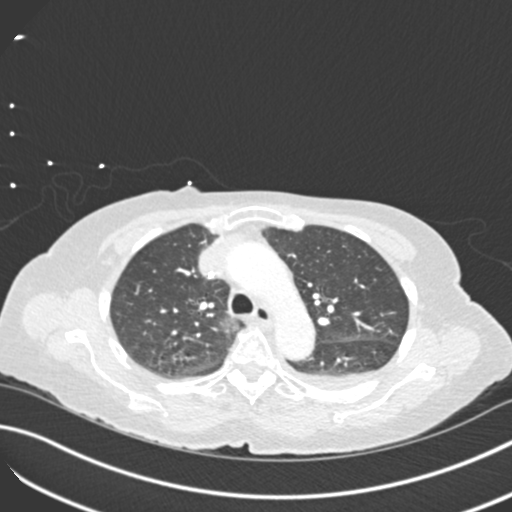
[im 181/241  soft-tissue]
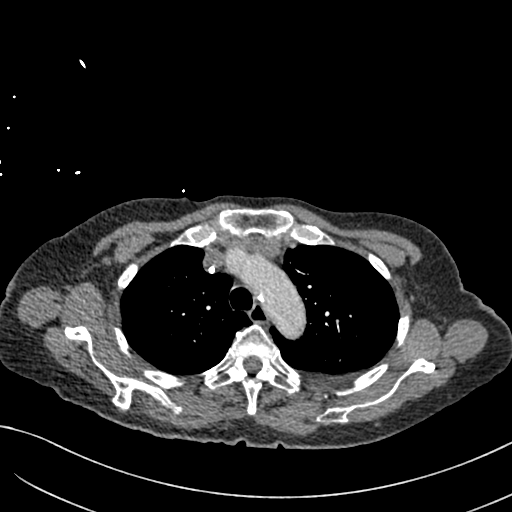
[im 196/241  lung]
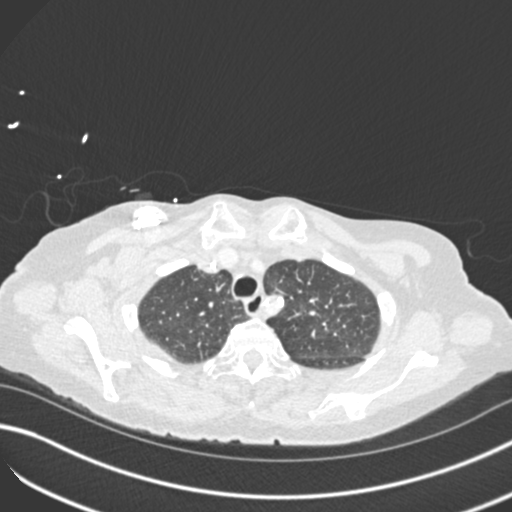
[im 211/241  soft-tissue]
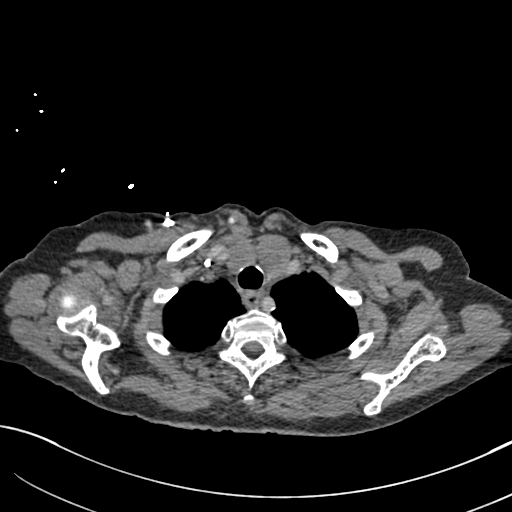
[im 226/241  lung]
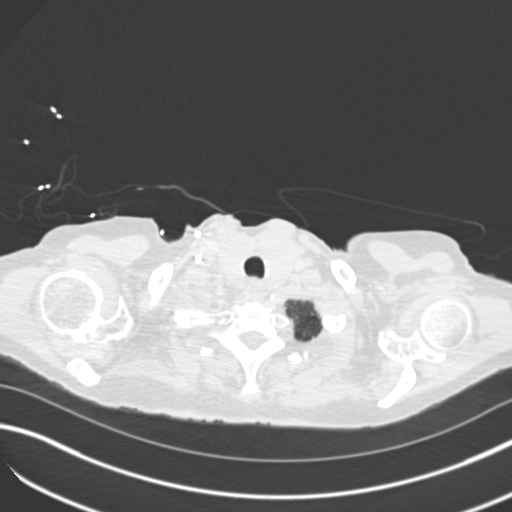

[Series 6: lung · axial · 0.72mm/px · z∈[-270,-240]mm · 2 of 121 slices shown]
[im 16/121  soft-tissue]
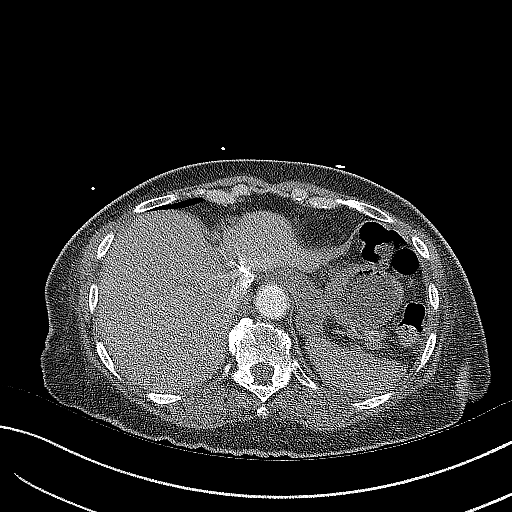
[im 31/121  soft-tissue]
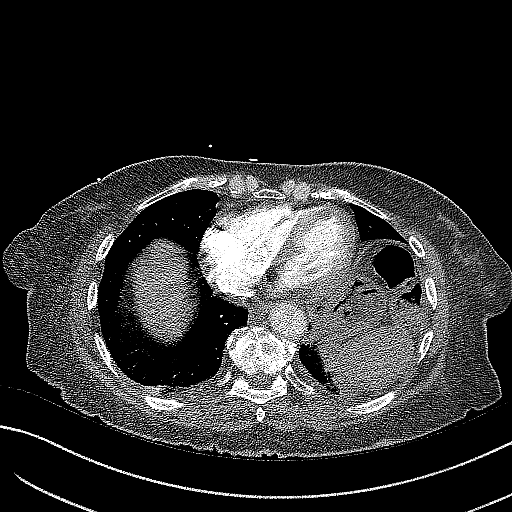

[Series 7: coronal mpr · coronal · 0.49mm/px · 2 of 69 slices shown]
[im 23/69  soft-tissue]
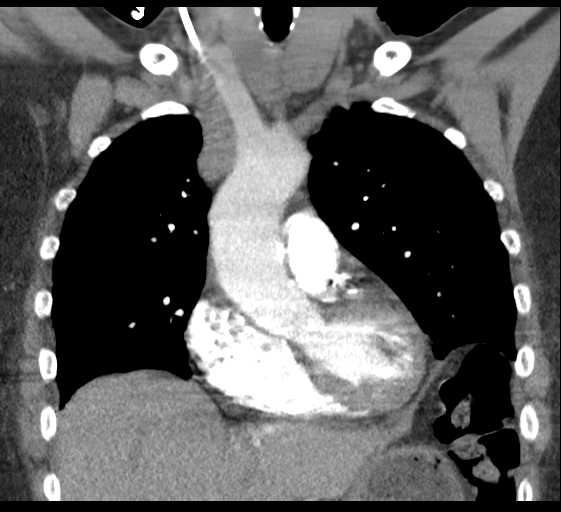
[im 46/69  soft-tissue]
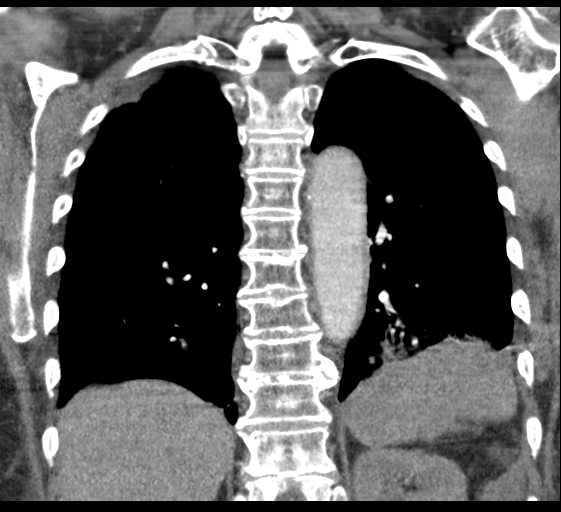

[19 of 46 positions shown; findings below may reference images not displayed]

FINDINGS: Cardiovascular: Satisfactory opacification of pulmonary arteries
noted, and no pulmonary emboli identified. No evidence of thoracic
aortic dissection or aneurysm. Aortic and coronary atherosclerotic
calcification noted.

Mediastinum/Nodes: Stable diffuse goiter. No pathologically enlarged
lymph nodes identified.

Lungs/Pleura: Scarring/post treatment change is again seen in the
left lower lobe. No discrete mass identified. No other suspicious
pulmonary nodules or masses demonstrated. No evidence of pulmonary
consolidation.

Upper abdomen: No acute findings.

Musculoskeletal: No suspicious bone lesions identified.

Review of the MIP images confirms the above findings.
IMPRESSION: No evidence of pulmonary embolism or other acute findings.

Stable left lower lobe scarring/post treatment change. No evidence
of recurrent or metastatic carcinoma.

Stable diffuse goiter.

Aortic Atherosclerosis ([7A]-[7A]).

## 2020-11-05 MED ORDER — SODIUM CHLORIDE (PF) 0.9 % IJ SOLN
INTRAMUSCULAR | Status: AC
  Filled 2020-11-05: qty 50

## 2020-11-05 MED ORDER — IOHEXOL 350 MG/ML SOLN
100.0000 mL | Freq: Once | INTRAVENOUS | Status: AC | PRN
  Administered 2020-11-05: 80 mL via INTRAVENOUS

## 2020-11-05 MED ORDER — CEFAZOLIN SODIUM-DEXTROSE 2-4 GM/100ML-% IV SOLN
2.0000 g | Freq: Three times a day (TID) | INTRAVENOUS | Status: DC
  Administered 2020-11-06 – 2020-11-08 (×7): 2 g via INTRAVENOUS
  Filled 2020-11-05 (×8): qty 100

## 2020-11-05 NOTE — Plan of Care (Signed)
Pt was very anxious last night; she was scheduled for a CT Scan and the IV Team had to be called for a line placement; she said she was nervous about the whole thing. Daughter called to ask what was going on with her mother who called her and was very upset stating that she didn't understand what was going on. I reassured her daughter and explained that I sat with Angelica [name] for about 1/2 hour listening to her talking about her kids and her childhood. Daughter was reassured and will come early to visit this am. After coming back from CT, pt fell asleep right away and was comfortable for the rest of the night. She shows signs of early memories lost and cannot remember the year but can maintain a conversation. She is now resting comfortably in bed with call light by her side and bed at lowest position for safety. No s/s of acute distress or pain reported or observed.

## 2020-11-05 NOTE — Evaluation (Signed)
Pt tolerated Physical Therapy Evaluation Patient Details Name: Angelica [name] MRN: [id number] DOB: 10/17/1940 Today's Date: 11/05/2020   History of Present Illness  The pt is an 80 yo female admit due to fever and sepsis. 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  Clinical Impression  Pt states she has help at home if needed. Pt stated she was receiving HHPT but has graduated from their services. Feel pt is very close to her baseline with mobility at this time. Will continue to mobilize and see while here, however no f/u needs at this time.     Follow Up Recommendations No PT follow up;Supervision for mobility/OOB    Equipment Recommendations  None recommended by PT    Recommendations for Other Services       Precautions / Restrictions Precautions Precautions:  (I asked pt if she had had any falls and she reports that she has not  ( chart looks diffrrently)) Restrictions Weight Bearing Restrictions: No      Mobility  Bed Mobility Overal bed mobility: Modified Independent             General bed mobility comments: pt with use of elevated HOB but able to mobilize without assist or use of bed rails.    Transfers Overall transfer level: Needs assistance Equipment used: None Transfers: Sit to/from Stand Sit to Stand: Supervision Stand pivot transfers: Supervision;Min guard          Ambulation/Gait Ambulation/Gait assistance: Min guard;Supervision Gait Distance (Feet): 100 Feet Assistive device: None Gait Pattern/deviations: Step-through pattern;Decreased stride length     General Gait Details: pt slow but steady, stated she is a little "woozy" from the infusion, she thinks it will pass  Science writer    Modified Rankin (Stroke Patients Only)       Balance Overall balance assessment: Mild deficits observed, not formally tested                                            Pertinent Vitals/Pain Pain Assessment: No/denies pain    Home Living                        Prior Function           Comments: Daughter assists with meals, setting up meds, and community mobility.  Patient will participate with home management. Pt states she does not use AD at home and independent with all ADLs     Hand Dominance   Dominant Hand: Right    Extremity/Trunk Assessment        Lower Extremity Assessment Lower Extremity Assessment: Generalized weakness;Overall [facility] for tasks assessed    Cervical / Trunk Assessment Cervical / Trunk Assessment: Kyphotic  Communication   Communication: No difficulties  Cognition Arousal/Alertness: Awake/alert Behavior During Therapy: WFL for tasks assessed/performed Overall Cognitive Status: Within Functional Limits for tasks assessed                                        General Comments      Exercises     Assessment/Plan    PT Assessment Patient needs continued PT services  PT Problem List Decreased activity tolerance;Decreased  strength;Decreased mobility       PT Treatment Interventions Gait training;Functional mobility training;Therapeutic activities;Therapeutic exercise;Balance training;Patient/family education    PT Goals (Current goals can be found in the Care Plan section)  Acute Rehab PT Goals Patient Stated Goal: I hope to be able to go home soon PT Goal Formulation: With patient Time For Goal Achievement: 11/17/20 Potential to Achieve Goals: Good    Frequency Min 3X/week   Barriers to discharge        Co-evaluation               AM-PAC PT "6 Clicks" Mobility  Outcome Measure Help needed turning from your back to your side while in a flat bed without using bedrails?: None Help needed moving from lying on your back to sitting on the side of a flat bed without using bedrails?: None Help needed moving to and from a bed to a chair (including a  wheelchair)?: None Help needed standing up from a chair using your arms (e.g., wheelchair or bedside chair)?: A Little Help needed to walk in hospital room?: A Little Help needed climbing 3-5 steps with a railing? : A Little 6 Click Score: 21    End of Session Equipment Utilized During Treatment: Gait belt Activity Tolerance: Patient tolerated treatment well Patient left: in chair;with call bell/phone within reach;with chair alarm set Nurse Communication: Mobility status PT Visit Diagnosis: Other abnormalities of gait and mobility (R26.89);Muscle weakness (generalized) (M62.81)    Time: 0881-1031 PT Time Calculation (min) (ACUTE ONLY): 29 min   Charges:   PT Evaluation $PT Eval Low Complexity: 1 Low PT Treatments $Gait Training: 8-22 mins        Shatia Sindoni, PT, MPT Acute Rehabilitation Services Office: [phone] Pager: [phone] 11/05/2020   Carmalita Wakefield, Gatha Mayer 11/05/2020, 11:12 AM

## 2020-11-05 NOTE — Progress Notes (Signed)
PROGRESS NOTE    BRITLEY GASHI  WJX:[id number] DOB: 1940-09-05 DOA: 11/02/2020 PCP: [name], MD  Chief Complaint  Patient presents with   Fever   Brief Narrative:  Angelica [name] is Angelica [name] 80 y.o. 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 Earlie Server, medical oncology, essential hypertension, chronic abdominal pain who was getting her immunotherapy at oncology clinic today and developed fever 130.  No paraspinous was called and she was subsequently sent to the emergency department.  Per ED reports, she continued to have persistent fever, her Tmax has been 102.9 here.Patient herself does not have any other complaint such as chest pain, shortness of breath, nausea, vomiting, any problem with urination or bowel movements, cough, shortness of breath or any other complaint.   ED Course: Upon arrival to ED, her temperature was 102.9, she was slightly tachycardic.  Mild leukocytosis on CBC.  Normal CMP and normal lactic acid.  No pathology on the chest x-ray.  ED physician discussed the case with patient's primary oncologist Dr. [name] who opined that patient likely had fever secondary to immunotherapy/chemotherapy however he recommended keeping in the hospital for observation and continued antibiotics and ruling out any underlying infection.  Patient received cefepime and vancomycin in the ED hospital service were consulted to admit the patient for further management.    Assessment & Plan:   Active Problems:   SIRS (systemic inflammatory response syndrome) (HCC)   Bacteremia due to Klebsiella pneumoniae   Fever  Sepsis 2/2 Klebsiella Pneumoniae Bacteremia: Patient meets SIRS criteria based off of fever, tachycardia and leukocytosis.   Transitioned to ceftriaxone 6/16 - 6/18.  Narrow to ancef. Blood cultures with klebsiella, sensitive to ancef.  She has penicillin allergy (unclear reaction). Urine culture pending (UA not c/w  UTI) Unclear etiology of bacteremia, often UTI, though UA not c/w this.    Concern for PE vs artifact Follow LE [place] and CT PE protocol   Leukocytosis 2/2 steroids and infection  Essential hypertension: continue to hold BP meds   Non-small cell lung carcinoma: Follows with Dr. [name] as outpatient.  S/p immunotherapy 6/15 (also had dose of steroids).  Further management per oncology. CT with contrast with interval decrease in size of LLL nodule, interval development of patchy consolidative opacities within the LLL which may represent atelecatsis or infection.  Small bilateral effusions.  Stable 3 mm nodule peripheral R middle lobe.  Enlarged heterogenous Thyroid Needs thyroid US outpatient  Asthma dulera Albuterol prn Singulair  Shoulder Pain  Add voltaren Tramadol prn  DVT prophylaxis: lovenox Code Status: full  Family Communication: daughter over phone and at bedside Disposition:   Status is: Observation  The patient will require care spanning > 2 midnights and should be moved to inpatient because: Inpatient level of care appropriate due to severity of illness  Dispo: The patient is from: Home              Anticipated d/c is to: Home              Patient currently is not medically stable to d/c.   Difficult to place patient No       Consultants:  none  Procedures:  none  Antimicrobials:  Anti-infectives (From admission, onward)    Start     Dose/Rate Route Frequency Ordered Stop   11/06/20 0600  ceFAZolin (ANCEF) IVPB 2g/100 mL premix        2 g 200 mL/hr over 30  Minutes Intravenous Every 8 hours 11/05/20 1321     11/03/20 1400  vancomycin (VANCOCIN) IVPB 1000 mg/200 mL premix  Status:  Discontinued        1,000 mg 200 mL/hr over 60 Minutes Intravenous Every 24 hours 11/02/20 1631 11/03/20 0831   11/03/20 0630  cefTRIAXone (ROCEPHIN) 2 g in sodium chloride 0.9 % 100 mL IVPB  Status:  Discontinued        2 g 200 mL/hr over 30 Minutes Intravenous Every 24  hours 11/03/20 0537 11/05/20 1320   11/02/20 2200  ceFEPIme (MAXIPIME) 2 g in sodium chloride 0.9 % 100 mL IVPB  Status:  Discontinued        2 g 200 mL/hr over 30 Minutes Intravenous Every 12 hours 11/02/20 1626 11/03/20 0537   11/02/20 1400  vancomycin (VANCOREADY) IVPB 1250 mg/250 mL        1,250 mg 166.7 mL/hr over 90 Minutes Intravenous  Once 11/02/20 1331 11/02/20 1658   11/02/20 1330  ceFEPIme (MAXIPIME) 2 g in sodium chloride 0.9 % 100 mL IVPB        2 g 200 mL/hr over 30 Minutes Intravenous  Once 11/02/20 1320 11/02/20 1530   11/02/20 1330  metroNIDAZOLE (FLAGYL) IVPB 500 mg        500 mg 100 mL/hr over 60 Minutes Intravenous  Once 11/02/20 1320 11/02/20 1530   11/02/20 1330  vancomycin (VANCOCIN) IVPB 1000 mg/200 mL premix  Status:  Discontinued        1,000 mg 200 mL/hr over 60 Minutes Intravenous  Once 11/02/20 1320 11/02/20 1329         Subjective: No complaints today  Objective: Vitals:   11/04/20 2252 11/05/20 0450 11/05/20 0835 11/05/20 1412  BP: 129/71 119/65  135/80  Pulse: 84 79  82  Resp: 17 17  15   Temp: 98.1 F (36.7 C) 98.9 F (37.2 C)  98 F (36.7 C)  TempSrc: Oral Oral  Oral  SpO2: 100% 100% 100% 100%  Weight:      Height:        Intake/Output Summary (Last 24 hours) at 11/05/2020 1458 Last data filed at 11/05/2020 0955 Gross per 24 hour  Intake 1899.71 ml  Output --  Net 1899.71 ml   Filed Weights   11/02/20 1159  Weight: 66.9 kg    Examination:  General: No acute distress. Cardiovascular: Heart sounds show Ediberto Sens regular rate, and rhythm.  Lungs: Clear to auscultation bilaterally  Abdomen: Soft, nontender, nondistended  Neurological: Alert and oriented 3. Moves all extremities 4 . Cranial nerves II through XII grossly intact. Skin: Warm and dry. No rashes or lesions. Extremities: No clubbing or cyanosis. No edema.    Data Reviewed: I have personally reviewed following labs and imaging studies  CBC: Recent Labs  Lab  11/02/20 0818 11/02/20 1217 11/03/20 0428 11/04/20 0514 11/05/20 0745  WBC 7.1 13.0* 22.3* 12.6* 6.4  NEUTROABS 5.3 12.6*  --  10.5* 4.8  HGB 11.1* 10.5* 10.2* 9.7* 10.4*  HCT 33.2* 31.6* 30.7* 28.7* 31.5*  MCV 90.5 92.1 92.7 92.3 92.1  PLT 220 192 191 183 829    Basic Metabolic Panel: Recent Labs  Lab 11/02/20 0818 11/02/20 1217 11/03/20 0428 11/04/20 0514 11/05/20 0745  NA 136 135 138 139 139  K 4.2 3.6 3.7 3.5 3.2*  CL 104 106 110 111 108  CO2 22 23 21* 21* 25  GLUCOSE 94 125* 143* 84 87  BUN 11 12 13 11  7*  CREATININE 0.77  0.76 0.85 0.77 0.60  CALCIUM 9.3 8.4* 8.7* 8.5* 8.5*  MG  --  1.6*  --  1.9 1.5*  PHOS  --   --  3.5  --  2.8    GFR: Estimated Creatinine Clearance: 50.5 mL/min (by C-G formula based on SCr of 0.6 mg/dL).  Liver Function Tests: Recent Labs  Lab 11/02/20 0818 11/02/20 1217 11/04/20 0514 11/05/20 0745  AST 18 19 13* 14*  ALT 9 11 11 10   ALKPHOS 70 59 49 47  BILITOT 0.3 0.4 0.2* 0.2*  PROT 7.1 6.4* 5.7* 5.7*  ALBUMIN 3.1* 3.1* 2.5* 2.7*    CBG: No results for input(s): GLUCAP in the last 168 hours.   Recent Results (from the past 240 hour(s))  Blood Culture (routine x 2)     Status: Abnormal   Collection Time: 11/02/20 12:17 PM   Specimen: BLOOD  Result Value Ref Range Status   Specimen Description   Final    BLOOD RIGHT ANTECUBITAL Performed at [facility] [address], [place], [place] [address]    Special Requests   Final    BOTTLES DRAWN AEROBIC AND ANAEROBIC Blood Culture results may not be optimal due to an inadequate volume of blood received in culture bottles Performed at [facility] [address] [place], [place] [address]    Culture  Setup Time   Final    GRAM NEGATIVE RODS IN BOTH AEROBIC AND ANAEROBIC BOTTLES CRITICAL VALUE NOTED.  VALUE IS CONSISTENT WITH PREVIOUSLY REPORTED AND CALLED VALUE.    Culture (Nekoda Chock)  Final    KLEBSIELLA PNEUMONIAE SUSCEPTIBILITIES  PERFORMED ON PREVIOUS CULTURE WITHIN THE LAST 5 DAYS. Performed at [facility], [place] [address], [place], [place] [address]    Report Status 11/05/2020 FINAL  Final  Blood Culture (routine x 2)     Status: Abnormal   Collection Time: 11/02/20 12:17 PM   Specimen: BLOOD  Result Value Ref Range Status   Specimen Description   Final    BLOOD LEFT ANTECUBITAL Performed at [facility] [address], [place], [place] [address]    Special Requests   Final    BOTTLES DRAWN AEROBIC AND ANAEROBIC Blood Culture results may not be optimal due to an inadequate volume of blood received in culture bottles Performed at [facility] [address] [place], [place] [address]    Culture  Setup Time   Final    GRAM NEGATIVE RODS IN BOTH AEROBIC AND ANAEROBIC BOTTLES CRITICAL RESULT CALLED TO, READ BACK BY AND VERIFIED WITH: Len Childs [phone] AT 530 AM Performed at  [facility], [place] [address], [place], [place] [address]    Culture KLEBSIELLA PNEUMONIAE (Shaketha Jeon)  Final   Report Status 11/05/2020 FINAL  Final   Organism ID, Bacteria KLEBSIELLA PNEUMONIAE  Final      Susceptibility   Klebsiella pneumoniae - MIC*    AMPICILLIN >=32 RESISTANT Resistant     CEFAZOLIN <=4 SENSITIVE Sensitive     CEFEPIME <=0.12 SENSITIVE Sensitive     CEFTAZIDIME <=1 SENSITIVE Sensitive     CEFTRIAXONE <=0.25 SENSITIVE Sensitive     CIPROFLOXACIN <=0.25 SENSITIVE Sensitive     GENTAMICIN <=1 SENSITIVE Sensitive     IMIPENEM <=0.25 SENSITIVE Sensitive     TRIMETH/SULFA <=20 SENSITIVE Sensitive     AMPICILLIN/SULBACTAM 8 SENSITIVE Sensitive     PIP/TAZO <=4 SENSITIVE Sensitive     * KLEBSIELLA PNEUMONIAE  Blood Culture ID  Panel (Reflexed)     Status: Abnormal   Collection Time: 11/02/20 12:17 PM  Result Value Ref Range Status   Enterococcus faecalis NOT DETECTED NOT DETECTED Final   Enterococcus Faecium NOT DETECTED NOT DETECTED Final   Listeria monocytogenes NOT  DETECTED NOT DETECTED Final   Staphylococcus species NOT DETECTED NOT DETECTED Final   Staphylococcus aureus (BCID) NOT DETECTED NOT DETECTED Final   Staphylococcus epidermidis NOT DETECTED NOT DETECTED Final   Staphylococcus lugdunensis NOT DETECTED NOT DETECTED Final   Streptococcus species NOT DETECTED NOT DETECTED Final   Streptococcus agalactiae NOT DETECTED NOT DETECTED Final   Streptococcus pneumoniae NOT DETECTED NOT DETECTED Final   Streptococcus pyogenes NOT DETECTED NOT DETECTED Final   Pinchus Weckwerth.calcoaceticus-baumannii NOT DETECTED NOT DETECTED Final   Bacteroides fragilis NOT DETECTED NOT DETECTED Final   Enterobacterales DETECTED (Tyne Banta) NOT DETECTED Final    Comment: Enterobacterales represent Alexei Doswell large order of gram negative bacteria, not Jennfer Gassen single organism. CRITICAL RESULT CALLED TO, READ BACK BY AND VERIFIED WITH: [place], AT [phone] 11/03/20 D. VANHOOK    Enterobacter cloacae complex NOT DETECTED NOT DETECTED Final   Escherichia coli NOT DETECTED NOT DETECTED Final   Klebsiella aerogenes NOT DETECTED NOT DETECTED Final   Klebsiella oxytoca NOT DETECTED NOT DETECTED Final   Klebsiella pneumoniae DETECTED (Jaydin Jalomo) NOT DETECTED Final    Comment: CRITICAL RESULT CALLED TO, READ BACK BY AND VERIFIED WITH: Seleta Rhymes PHARMD, AT [phone] 11/03/20 D. VANHOOK    Proteus species NOT DETECTED NOT DETECTED Final   Salmonella species NOT DETECTED NOT DETECTED Final   Serratia marcescens NOT DETECTED NOT DETECTED Final   Haemophilus influenzae NOT DETECTED NOT DETECTED Final   Neisseria meningitidis NOT DETECTED NOT DETECTED Final   Pseudomonas aeruginosa NOT DETECTED NOT DETECTED Final   Stenotrophomonas maltophilia NOT DETECTED NOT DETECTED Final   Candida albicans NOT DETECTED NOT DETECTED Final   Candida auris NOT DETECTED NOT DETECTED Final   Candida glabrata NOT DETECTED NOT DETECTED Final   Candida krusei NOT DETECTED NOT DETECTED Final   Candida parapsilosis NOT DETECTED NOT DETECTED  Final   Candida tropicalis NOT DETECTED NOT DETECTED Final   Cryptococcus neoformans/gattii NOT DETECTED NOT DETECTED Final   CTX-M ESBL NOT DETECTED NOT DETECTED Final   Carbapenem resistance IMP NOT DETECTED NOT DETECTED Final   Carbapenem resistance KPC NOT DETECTED NOT DETECTED Final   Carbapenem resistance NDM NOT DETECTED NOT DETECTED Final   Carbapenem resist OXA 48 LIKE NOT DETECTED NOT DETECTED Final   Carbapenem resistance VIM NOT DETECTED NOT DETECTED Final    Comment: Performed at [facility], [address], [place], [place] [address]  Resp Panel by RT-PCR (Flu Derelle Cockrell&B, Covid) Nasopharyngeal Swab     Status: None   Collection Time: 11/02/20  1:10 PM   Specimen: Nasopharyngeal Swab; Nasopharyngeal(NP) swabs in vial transport medium  Result Value Ref Range Status   SARS Coronavirus 2 by RT PCR NEGATIVE NEGATIVE Final    Comment: (NOTE) SARS-CoV-2 target nucleic acids are NOT DETECTED.  The SARS-CoV-2 RNA is generally detectable in upper respiratory specimens during the acute phase of infection. The lowest concentration of SARS-CoV-2 viral copies this assay can detect is 138 copies/mL. Shronda Boeh negative result does not preclude SARS-Cov-2 infection and should not be used as the sole basis for treatment or other patient management decisions. Calistro Rauf negative result may occur with  improper specimen collection/handling, submission of specimen other than nasopharyngeal swab, presence of viral mutation(s) within the areas  targeted by this assay, and inadequate number of viral copies(<138 copies/mL). Melaya Hoselton negative result must be combined with clinical observations, patient history, and epidemiological information. The expected result is Negative.  Fact Sheet for Patients:  EntrepreneurPulse.com.au  Fact Sheet for Healthcare Providers:  IncredibleEmployment.be  This test is no t yet approved or cleared by the [place] FDA and  has been authorized  for detection and/or diagnosis of SARS-CoV-2 by FDA under an Emergency Use Authorization (EUA). This EUA will remain  in effect (meaning this test can be used) for the duration of the COVID-19 declaration under Section 564(b)(1) of the Act, 21 U.S.C.section 360bbb-3(b)(1), unless the authorization is terminated  or revoked sooner.       Influenza Mesha Schamberger by PCR NEGATIVE NEGATIVE Final   Influenza B by PCR NEGATIVE NEGATIVE Final    Comment: (NOTE) The Xpert Xpress SARS-CoV-2/FLU/RSV plus assay is intended as an aid in the diagnosis of influenza from Nasopharyngeal swab specimens and should not be used as Ermelinda Eckert sole basis for treatment. Nasal washings and aspirates are unacceptable for Xpert Xpress SARS-CoV-2/FLU/RSV testing.  Fact Sheet for Patients: EntrepreneurPulse.com.au  Fact Sheet for Healthcare Providers: IncredibleEmployment.be  This test is not yet approved or cleared by the [place] FDA and has been authorized for detection and/or diagnosis of SARS-CoV-2 by FDA under an Emergency Use Authorization (EUA). This EUA will remain in effect (meaning this test can be used) for the duration of the COVID-19 declaration under Section 564(b)(1) of the Act, 21 U.S.C. section 360bbb-3(b)(1), unless the authorization is terminated or revoked.  Performed at [facility], [place] [address], [place], [place] [address]   Urine culture     Status: Abnormal   Collection Time: 11/02/20  3:35 PM   Specimen: In/Out Cath Urine  Result Value Ref Range Status   Specimen Description   Final    IN/OUT CATH URINE Performed at [facility] [address], [place], [place] [address]    Special Requests   Final    NONE Performed at [facility], [place] [address], [place], [place] [address]    Culture MULTIPLE SPECIES PRESENT, SUGGEST RECOLLECTION (Loden Laurent)  Final   Report Status 11/04/2020 FINAL  Final          Radiology Studies: CT CHEST W CONTRAST  Result Date: 11/05/2020 CLINICAL DATA:  Non-small cell lung cancer on immunotherapy. Patient with fever. EXAM: CT CHEST WITH CONTRAST TECHNIQUE: Multidetector CT imaging of the chest was performed during intravenous contrast administration. CONTRAST:  75mL OMNIPAQUE IOHEXOL 300 MG/ML  SOLN COMPARISON:  CT chest 08/29/2020. FINDINGS: Cardiovascular: Right anterior chest wall Port-Javonnie Illescas-Cath is present with tip terminating in the superior vena cava. Normal heart size. Coronary arterial vascular calcifications. Trace fluid superior pericardial recess. Thoracic aortic vascular calcifications. There is Dontrey Snellgrove possible small filling defect within Afomia Blackley distal subsegmental right lower lobe pulmonary artery (image 82; series 3). Mediastinum/Nodes: No enlarged axillary, mediastinal or hilar lymphadenopathy. Normal appearance of the esophagus. Persistent enlarged heterogeneous thyroid. Lungs/Pleura: Central airways are patent. Continued interval decrease in size of left lower lobe nodule measuring 1.6 x 1.7 cm (image 98; series 8), previously 2.1 x 2.2 cm. Slight interval increase in patchy consolidative opacities within the more peripheral left lower lobe. Interval development of small bilateral pleural effusions. Minimal right basilar atelectasis. No pneumothorax. Stable 3 mm peripheral right middle lobe nodule (image 74; series 8). Upper Abdomen: No acute process. Musculoskeletal: Thoracic spine degenerative changes. No aggressive or acute appearing osseous  lesions. IMPRESSION: 1. Suggestion of Keithan Dileonardo small filling defect within Alrick Cubbage peripheral right lower lobe subsegmental pulmonary artery, potentially representing distal pulmonary embolus versus artifact. If clinically warranted, consider further evaluation with PE CT. 2. Interval decrease in size of left lower lobe nodule. 3. Interval development of patchy consolidative opacities within the left lower lobe which may represent  atelectasis or infection. 4. Small bilateral pleural effusions. 5. Enlarged heterogeneous thyroid. Recommend thyroid ultrasound if and when clinically appropriate in the setting of metastatic malignancy. 6. Stable 3 mm nodule peripheral right middle lobe. Recommend continued attention on follow-up. 7. These results were called by telephone at the time of interpretation on 11/05/2020 at 8:59 am to provider Judeen Geralds Cephus Slater , who verbally acknowledged these results. Electronically Signed   By: Lovey Newcomer M.D.   On: 11/05/2020 09:00        Scheduled Meds:  acetaminophen  650 mg Oral Once   Chlorhexidine Gluconate Cloth  6 each Topical Daily   cholecalciferol  2,000 Units Oral Daily   enoxaparin (LOVENOX) injection  40 mg Subcutaneous Daily   loratadine  10 mg Oral Daily   mometasone-formoterol  2 puff Inhalation BID   montelukast  10 mg Oral QHS   pravastatin  80 mg Oral Daily   sodium chloride flush  10-40 mL Intracatheter Q12H   Continuous Infusions:  sodium chloride 75 mL/hr at 11/05/20 1022   [START ON 11/06/2020]  ceFAZolin (ANCEF) IV       LOS: 2 days    Time spent: over 30 min    Fayrene Helper, MD [facility]   To contact the attending provider between 7A-7P or the covering provider during after hours 7P-7A, please log into the web site [email or web address] and access using universal [place] password for that web site. If you do not have the password, please call the hospital operator.  11/05/2020, 2:58 PM

## 2020-11-05 NOTE — Progress Notes (Signed)
BLE venous duplex has been completed.  Results can be found under chart review under CV PROC. 11/05/2020 4:11 PM Joscelin Fray RVT, RDMS

## 2020-11-06 DIAGNOSIS — R509 Fever, unspecified: Secondary | ICD-10-CM | POA: Diagnosis not present

## 2020-11-06 LAB — CBC WITH DIFFERENTIAL/PLATELET
Abs Immature Granulocytes: 0.03 10*3/uL (ref 0.00–0.07)
Basophils Absolute: 0 10*3/uL (ref 0.0–0.1)
Basophils Relative: 0 %
Eosinophils Absolute: 0 10*3/uL (ref 0.0–0.5)
Eosinophils Relative: 0 %
HCT: 30.5 % — ABNORMAL LOW (ref 36.0–46.0)
Hemoglobin: 10.1 g/dL — ABNORMAL LOW (ref 12.0–15.0)
Immature Granulocytes: 1 %
Lymphocytes Relative: 18 %
Lymphs Abs: 1.1 10*3/uL (ref 0.7–4.0)
MCH: 30.5 pg (ref 26.0–34.0)
MCHC: 33.1 g/dL (ref 30.0–36.0)
MCV: 92.1 fL (ref 80.0–100.0)
Monocytes Absolute: 0.6 10*3/uL (ref 0.1–1.0)
Monocytes Relative: 10 %
Neutro Abs: 4.3 10*3/uL (ref 1.7–7.7)
Neutrophils Relative %: 71 %
Platelets: 199 10*3/uL (ref 150–400)
RBC: 3.31 MIL/uL — ABNORMAL LOW (ref 3.87–5.11)
RDW: 13.1 % (ref 11.5–15.5)
WBC: 5.9 10*3/uL (ref 4.0–10.5)
nRBC: 0 % (ref 0.0–0.2)

## 2020-11-06 LAB — COMPREHENSIVE METABOLIC PANEL
ALT: 12 U/L (ref 0–44)
AST: 17 U/L (ref 15–41)
Albumin: 2.7 g/dL — ABNORMAL LOW (ref 3.5–5.0)
Alkaline Phosphatase: 48 U/L (ref 38–126)
Anion gap: 3 — ABNORMAL LOW (ref 5–15)
BUN: 5 mg/dL — ABNORMAL LOW (ref 8–23)
CO2: 25 mmol/L (ref 22–32)
Calcium: 8.4 mg/dL — ABNORMAL LOW (ref 8.9–10.3)
Chloride: 110 mmol/L (ref 98–111)
Creatinine, Ser: 0.65 mg/dL (ref 0.44–1.00)
GFR, Estimated: >60 mL/min (ref >60)
Glucose, Bld: 87 mg/dL (ref 70–99)
Potassium: 3.3 mmol/L — ABNORMAL LOW (ref 3.5–5.1)
Sodium: 138 mmol/L (ref 135–145)
Total Bilirubin: 0.3 mg/dL (ref 0.3–1.2)
Total Protein: 5.6 g/dL — ABNORMAL LOW (ref 6.5–8.1)

## 2020-11-06 LAB — MAGNESIUM: Magnesium: 1.6 mg/dL — ABNORMAL LOW (ref 1.7–2.4)

## 2020-11-06 LAB — PHOSPHORUS: Phosphorus: 3.3 mg/dL (ref 2.5–4.6)

## 2020-11-06 MED ORDER — MAGNESIUM SULFATE 2 GM/50ML IV SOLN
2.0000 g | Freq: Once | INTRAVENOUS | Status: AC
  Administered 2020-11-06: 2 g via INTRAVENOUS
  Filled 2020-11-06: qty 50

## 2020-11-06 NOTE — Consult Note (Signed)
[facility]                                                                                        Patient Identification: Patient Name: Angelica [name] MRN: [id number] [place] Date: 11/02/2020 11:51 AM Today's Date: 11/06/2020 Reason for consult: GN bacteremia  Requesting provider: A Fayrene Helper   Active Problems:   SIRS (systemic inflammatory response syndrome) ([place])   Bacteremia due to Klebsiella pneumoniae   Fever   Antibiotics: Cefazolin 6/18-current                    Total days of antibiotics 5  Lines/Tubes: Right-sided port  Assessment Klebsiella pneumonia bacteremia in the setting of [place] Denies having any pain/tenderness/swelling at the [place] site.  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 Please call with questions or concerns.  Thank you for the consult  Rosiland Oz, MD Infectious Disease Physician [facility] [address] [place], [place] [address] Phone: [phone]  Fax: [phone]  __________________________________________________________________________________________________________ HPI and Hospital Course: 80 year old female with past medical history of non-small cell lung cancer on immunotherapy followed by Dr. [name], hypertension who presented to the ED from her oncology clinic with fever while she was getting her immunotherapy.  Her T-max was 102.9.  Patient did not have any other complaints on ED presentation like chest  pain which, shortness of breath, nausea, vomiting urinary or bowel symptoms.   On arrival to the ED, she had T-max 102.9, WBC 22.3.  Chest x-ray was unremarkable.  CT chest/CT angio chest findings as below.  Blood culture 6/15 both sets with Klebsiella pneumonia   ROS: General- Denies fever, chills, loss of appetite and loss of weight HEENT - Denies headache, blurry vision, neck pain, sinus pain Chest - Denies any chest pain, SOB or cough CVS- Denies any dizziness/lightheadedness, syncopal attacks, palpitations Abdomen- Denies any nausea, vomiting, abdominal pain, hematochezia and diarrhea Neuro - Denies any weakness, numbness, tingling sensation Psych - Denies any changes in mood irritability or depressive symptoms GU- Denies any burning, dysuria, hematuria or increased frequency of urination Skin - denies any rashes/lesions MSK - denies any joint pain/swelling or restricted ROM    Past Medical History:  Diagnosis Date   Arthritis    Asthma    Bowel obstruction (HCC)    Cancer ([place])    Environmental allergies    GERD (gastroesophageal reflux disease)    Glaucoma 05/09/2017   H. pylori infection    History of radiation therapy 07/05/20-08/05/20   Left lung IMRT Dr. Sondra Come    HLD (hyperlipidemia) 01/16/2019   Hypertension    Iron deficiency anemia    Osteopenia 05/26/2017   Thyroid disease    Urticaria 07/25/2018  Past Surgical History:  Procedure Laterality Date   ABDOMINAL HYSTERECTOMY     BOWEL RESECTION N/A 05/23/2020   Procedure: SMALL BOWEL REPAIR;  Surgeon: Kinsinger, Arta Bruce, MD;  Location: [facility];  Service: General;  Laterality: N/A;   BREAST BIOPSY     BRONCHIAL BIOPSY  06/10/2020   Procedure: BRONCHIAL BIOPSIES;  Surgeon: Garner Nash, DO;  Location: [facility];  Service: Pulmonary;;   BRONCHIAL BRUSHINGS  06/10/2020   Procedure: BRONCHIAL BRUSHINGS;  Surgeon: Garner Nash, DO;  Location: [facility];  Service: Pulmonary;;   BRONCHIAL NEEDLE ASPIRATION  BIOPSY  06/10/2020   Procedure: BRONCHIAL NEEDLE ASPIRATION BIOPSIES;  Surgeon: Garner Nash, DO;  Location: [facility];  Service: Pulmonary;;   BRONCHIAL WASHINGS  06/10/2020   Procedure: BRONCHIAL WASHINGS;  Surgeon: Garner Nash, DO;  Location: [facility];  Service: Pulmonary;;   COLON SURGERY     DIAGNOSTIC LARYNGOSCOPY N/A 05/23/2020   Procedure: ATTEMPTED DIAGNOSTIC LAPAROSCOPY WITH ADHESIONS;  Surgeon: Kieth Brightly, Arta Bruce, MD;  Location: [facility];  Service: General;  Laterality: N/A;   IR IMAGING GUIDED PORT INSERTION  07/15/2020   KNEE SURGERY     LAPAROTOMY N/A 04/18/2015   Procedure: Exploratory laparotomy with lysis of adhesions, possible bowel resection;  Surgeon: Ralene Ok, MD;  Location: [facility];  Service: General;  Laterality: N/A;   LAPAROTOMY N/A 05/23/2020   Procedure: EXPLORATORY LAPAROTOMY;  Surgeon: Mickeal Skinner, MD;  Location: [facility];  Service: General;  Laterality: N/A;   LYSIS OF ADHESION N/A 05/23/2020   Procedure: LYSIS OF ADHESION;  Surgeon: Kieth Brightly Arta Bruce, MD;  Location: [facility];  Service: General;  Laterality: N/A;   NASAL SINUS SURGERY     THYROID SURGERY     VIDEO BRONCHOSCOPY WITH ENDOBRONCHIAL NAVIGATION Bilateral 06/10/2020   Procedure: VIDEO BRONCHOSCOPY WITH ENDOBRONCHIAL NAVIGATION;  Surgeon: Garner Nash, DO;  Location: [facility];  Service: Pulmonary;  Laterality: Bilateral;   VIDEO BRONCHOSCOPY WITH ENDOBRONCHIAL ULTRASOUND  06/10/2020   Procedure: VIDEO BRONCHOSCOPY WITH ENDOBRONCHIAL ULTRASOUND;  Surgeon: Garner Nash, DO;  [facility];  Service: Pulmonary;;     Scheduled Meds:  acetaminophen  650 mg Oral Once   Chlorhexidine Gluconate Cloth  6 each Topical Daily   cholecalciferol  2,000 Units Oral Daily   enoxaparin (LOVENOX) injection  40 mg Subcutaneous Daily   loratadine  10 mg Oral Daily   mometasone-formoterol  2 puff Inhalation BID   montelukast  10 mg Oral QHS   pravastatin  80 mg Oral Daily    sodium chloride flush  10-40 mL Intracatheter Q12H   Continuous Infusions:  sodium chloride 75 mL/hr at 11/05/20 2125    ceFAZolin (ANCEF) IV 2 g (11/06/20 0558)   PRN Meds:.acetaminophen, albuterol, alum & mag hydroxide-simeth, diclofenac Sodium, linaclotide, ondansetron **OR** ondansetron (ZOFRAN) IV, polyethylene glycol, sodium chloride flush, traMADol  Allergies  Allergen Reactions   Asa Buff (Mag [Buffered Aspirin] Other (See Comments)    Excessive sweating   Ensure [Nutritional Supplements]     Or boost - upset stomach    Fish Allergy Other (See Comments)    Unknown reaction, but allergic   Other Other (See Comments)    Gelcaps; gel-containing capsules; extended-release medication - upset stomach    Peanut-Containing Drug Products Other (See Comments)    From allergy test   Penicillins Itching    Has patient had a PCN reaction causing immediate rash, facial/tongue/throat swelling, SOB or lightheadedness with hypotension: No Has patient had a  PCN reaction causing severe rash involving mucus membranes or skin necrosis: No Has patient had a PCN reaction that required hospitalization No Has patient had a PCN reaction occurring within the last 10 years: No If all of the above answers are "NO", then may proceed with Cephalosporin use.   Shellfish-Derived Products Other (See Comments)    From allergy test   Strawberry Extract Other (See Comments)    From allergy test    Social History   Socioeconomic History   Marital status: Widowed    Spouse name: Not on file   Number of children: 4   Years of education: Not on file   Highest education level: Not on file  Occupational History   Occupation: retired  Tobacco Use   Smoking status: Former    Pack years: 0.00    Types: Cigarettes   Smokeless tobacco: Never   Tobacco comments:    quit 60 years ago  Vaping Use   Vaping Use: Never used  Substance and Sexual Activity   Alcohol use: No   Drug use: No   Sexual activity:  Never  Other Topics Concern   Not on file  Social History Narrative   Not on file   Social Determinants of Health   Financial Resource Strain: Low Risk    Difficulty of Paying Living Expenses: Not hard at all  Food Insecurity: No Food Insecurity   Worried About Charity fundraiser in the Last Year: Never true   [place] in the Last Year: Never true  Transportation Needs: No Transportation Needs   Lack of Transportation (Medical): No   Lack of Transportation (Non-Medical): No  Physical Activity: Sufficiently Active   Days of Exercise per Week: 5 days   Minutes of Exercise per Session: 30 min  Stress: No Stress Concern Present   Feeling of Stress : Not at all  Social Connections: Moderately Integrated   Frequency of Communication with Friends and Family: More than three times a week   Frequency of Social Gatherings with Friends and Family: More than three times a week   Attends Religious Services: More than 4 times per year   Active Member of [employer] or Organizations: Yes   Attends Archivist Meetings: More than 4 times per year   Marital Status: Widowed  Human resources officer Violence: Not on file    Vitals BP 134/79 (BP Location: Right Arm)   Pulse 69   Temp 98 F (36.7 C) (Oral)   Resp 16   Ht 5\' 5"  (1.651 m)   Wt 66.9 kg   SpO2 97%   BMI 24.54 kg/m    Physical Exam Constitutional: She was being cleaned and changing her dress, appears to be comfortable, not in acute distress    Comments:   Cardiovascular:     Rate and Rhythm: Normal rate and regular rhythm.     Heart sounds:   Pulmonary:     Effort: Pulmonary effort is normal.     Comments: Bilateral clear air entry  Abdominal:     Palpations: Abdomen is soft.     Tenderness: Nontender and nondistended  Musculoskeletal:        General: No swelling or tenderness.   Skin:    Comments: No lesions or rashes  Neurological:     General: No focal deficit present.   Psychiatric:        Mood and  Affect: Mood normal.    Pertinent Microbiology Results for orders placed or  performed during the hospital encounter of 11/02/20  Blood Culture (routine x 2)     Status: Abnormal   Collection Time: 11/02/20 12:17 PM   Specimen: BLOOD  Result Value Ref Range Status   Specimen Description   Final    BLOOD RIGHT ANTECUBITAL Performed at [facility] [address], [place], [place] [address]    Special Requests   Final    BOTTLES DRAWN AEROBIC AND ANAEROBIC Blood Culture results may not be optimal due to an inadequate volume of blood received in culture bottles Performed at [facility] [address], [place], [place] [address]    Culture  Setup Time   Final    GRAM NEGATIVE RODS IN BOTH AEROBIC AND ANAEROBIC BOTTLES CRITICAL VALUE NOTED.  VALUE IS CONSISTENT WITH PREVIOUSLY REPORTED AND CALLED VALUE.    Culture (A)  Final    KLEBSIELLA PNEUMONIAE SUSCEPTIBILITIES PERFORMED ON PREVIOUS CULTURE WITHIN THE LAST 5 DAYS. Performed at [facility], [place] [address], [place], [place] [address]    Report Status 11/05/2020 FINAL  Final  Blood Culture (routine x 2)     Status: Abnormal   Collection Time: 11/02/20 12:17 PM   Specimen: BLOOD  Result Value Ref Range Status   Specimen Description   Final    BLOOD LEFT ANTECUBITAL Performed at [facility] [address], [place], [place] [address]    Special Requests   Final    BOTTLES DRAWN AEROBIC AND ANAEROBIC Blood Culture results may not be optimal due to an inadequate volume of blood received in culture bottles Performed at [facility] [address], [place], [place] [address]    Culture  Setup Time   Final    GRAM NEGATIVE RODS IN BOTH AEROBIC AND ANAEROBIC BOTTLES CRITICAL RESULT CALLED TO, READ BACK BY AND VERIFIED WITH: Len Childs [phone] AT 530 AM Performed at [facility], [place] [address], [place], [place] [address]    Culture  KLEBSIELLA PNEUMONIAE (A)  Final   Report Status 11/05/2020 FINAL  Final   Organism ID, Bacteria KLEBSIELLA PNEUMONIAE  Final      Susceptibility   Klebsiella pneumoniae - MIC*    AMPICILLIN >=32 RESISTANT Resistant     CEFAZOLIN <=4 SENSITIVE Sensitive     CEFEPIME <=0.12 SENSITIVE Sensitive     CEFTAZIDIME <=1 SENSITIVE Sensitive     CEFTRIAXONE <=0.25 SENSITIVE Sensitive     CIPROFLOXACIN <=0.25 SENSITIVE Sensitive     GENTAMICIN <=1 SENSITIVE Sensitive     IMIPENEM <=0.25 SENSITIVE Sensitive     TRIMETH/SULFA <=20 SENSITIVE Sensitive     AMPICILLIN/SULBACTAM 8 SENSITIVE Sensitive     PIP/TAZO <=4 SENSITIVE Sensitive     * KLEBSIELLA PNEUMONIAE  Blood Culture ID Panel (Reflexed)     Status: Abnormal   Collection Time: 11/02/20 12:17 PM  Result Value Ref Range Status   Enterococcus faecalis NOT DETECTED NOT DETECTED Final   Enterococcus Faecium NOT DETECTED NOT DETECTED Final   Listeria monocytogenes NOT DETECTED NOT DETECTED Final   Staphylococcus species NOT DETECTED NOT DETECTED Final   Staphylococcus aureus (BCID) NOT DETECTED NOT DETECTED Final   Staphylococcus epidermidis NOT DETECTED NOT DETECTED Final   Staphylococcus lugdunensis NOT DETECTED NOT DETECTED Final   Streptococcus species NOT DETECTED NOT DETECTED Final   Streptococcus agalactiae NOT DETECTED NOT DETECTED Final   Streptococcus pneumoniae NOT DETECTED NOT DETECTED Final   Streptococcus pyogenes NOT DETECTED NOT DETECTED Final  A.calcoaceticus-baumannii NOT DETECTED NOT DETECTED Final   Bacteroides fragilis NOT DETECTED NOT DETECTED Final   Enterobacterales DETECTED (A) NOT DETECTED Final    Comment: Enterobacterales represent a large order of gram negative bacteria, not a single organism. CRITICAL RESULT CALLED TO, READ BACK BY AND VERIFIED WITH: [place], AT [phone] 11/03/20 D. VANHOOK    Enterobacter cloacae complex NOT DETECTED NOT DETECTED Final   Escherichia coli NOT DETECTED NOT DETECTED Final    Klebsiella aerogenes NOT DETECTED NOT DETECTED Final   Klebsiella oxytoca NOT DETECTED NOT DETECTED Final   Klebsiella pneumoniae DETECTED (A) NOT DETECTED Final    Comment: CRITICAL RESULT CALLED TO, READ BACK BY AND VERIFIED WITH: Seleta Rhymes PHARMD, AT [phone] 11/03/20 D. VANHOOK    Proteus species NOT DETECTED NOT DETECTED Final   Salmonella species NOT DETECTED NOT DETECTED Final   Serratia marcescens NOT DETECTED NOT DETECTED Final   Haemophilus influenzae NOT DETECTED NOT DETECTED Final   Neisseria meningitidis NOT DETECTED NOT DETECTED Final   Pseudomonas aeruginosa NOT DETECTED NOT DETECTED Final   Stenotrophomonas maltophilia NOT DETECTED NOT DETECTED Final   Candida albicans NOT DETECTED NOT DETECTED Final   Candida auris NOT DETECTED NOT DETECTED Final   Candida glabrata NOT DETECTED NOT DETECTED Final   Candida krusei NOT DETECTED NOT DETECTED Final   Candida parapsilosis NOT DETECTED NOT DETECTED Final   Candida tropicalis NOT DETECTED NOT DETECTED Final   Cryptococcus neoformans/gattii NOT DETECTED NOT DETECTED Final   CTX-M ESBL NOT DETECTED NOT DETECTED Final   Carbapenem resistance IMP NOT DETECTED NOT DETECTED Final   Carbapenem resistance KPC NOT DETECTED NOT DETECTED Final   Carbapenem resistance NDM NOT DETECTED NOT DETECTED Final   Carbapenem resist OXA 48 LIKE NOT DETECTED NOT DETECTED Final   Carbapenem resistance VIM NOT DETECTED NOT DETECTED Final    Comment: Performed at [facility], [address] [place], [place] [address]  Resp Panel by RT-PCR (Flu A&B, Covid) Nasopharyngeal Swab     Status: None   Collection Time: 11/02/20  1:10 PM   Specimen: Nasopharyngeal Swab; Nasopharyngeal(NP) swabs in vial transport medium  Result Value Ref Range Status   SARS Coronavirus 2 by RT PCR NEGATIVE NEGATIVE Final    Comment: (NOTE) SARS-CoV-2 target nucleic acids are NOT DETECTED.  The SARS-CoV-2 RNA is generally detectable in upper respiratory specimens  during the acute phase of infection. The lowest concentration of SARS-CoV-2 viral copies this assay can detect is 138 copies/mL. A negative result does not preclude SARS-Cov-2 infection and should not be used as the sole basis for treatment or other patient management decisions. A negative result may occur with  improper specimen collection/handling, submission of specimen other than nasopharyngeal swab, presence of viral mutation(s) within the areas targeted by this assay, and inadequate number of viral copies(<138 copies/mL). A negative result must be combined with clinical observations, patient history, and epidemiological information. The expected result is Negative.  Fact Sheet for Patients:  EntrepreneurPulse.com.au  Fact Sheet for Healthcare Providers:  IncredibleEmployment.be  This test is no t yet approved or cleared by the [place] FDA and  has been authorized for detection and/or diagnosis of SARS-CoV-2 by FDA under an Emergency Use Authorization (EUA). This EUA will remain  in effect (meaning this test can be used) for the duration of the COVID-19 declaration under Section 564(b)(1) of the Act, 21 U.S.C.section 360bbb-3(b)(1), unless the authorization is terminated  or revoked sooner.  Influenza A by PCR NEGATIVE NEGATIVE Final   Influenza B by PCR NEGATIVE NEGATIVE Final    Comment: (NOTE) The Xpert Xpress SARS-CoV-2/FLU/RSV plus assay is intended as an aid in the diagnosis of influenza from Nasopharyngeal swab specimens and should not be used as a sole basis for treatment. Nasal washings and aspirates are unacceptable for Xpert Xpress SARS-CoV-2/FLU/RSV testing.  Fact Sheet for Patients: EntrepreneurPulse.com.au  Fact Sheet for Healthcare Providers: IncredibleEmployment.be  This test is not yet approved or cleared by the [place] FDA and has been authorized for detection  and/or diagnosis of SARS-CoV-2 by FDA under an Emergency Use Authorization (EUA). This EUA will remain in effect (meaning this test can be used) for the duration of the COVID-19 declaration under Section 564(b)(1) of the Act, 21 U.S.C. section 360bbb-3(b)(1), unless the authorization is terminated or revoked.  Performed at [facility], [place] [address], [place], [place] [address]   Urine culture     Status: Abnormal   Collection Time: 11/02/20  3:35 PM   Specimen: In/Out Cath Urine  Result Value Ref Range Status   Specimen Description   Final    IN/OUT CATH URINE Performed at [facility] [address], [place], [place] [address]    Special Requests   Final    NONE Performed at [facility], [place] [address], [place], [place] [address]    Culture MULTIPLE SPECIES PRESENT, SUGGEST RECOLLECTION (A)  Final   Report Status 11/04/2020 FINAL  Final  Culture, blood (routine x 2)     Status: None (Preliminary result)   Collection Time: 11/06/20  1:38 PM   Specimen: BLOOD  Result Value Ref Range Status   Specimen Description   Final    BLOOD BLOOD RIGHT FOREARM Performed at [facility] [address] [place], [place] [address]    Special Requests   Final    BOTTLES DRAWN AEROBIC ONLY Blood Culture results may not be optimal due to an inadequate volume of blood received in culture bottles Performed at [facility] [address], [place], [place] [address]    Culture   Final    NO GROWTH < 24 HOURS Performed at [facility] [address], [place], [place] [address]    Report Status PENDING  Incomplete  Culture, blood (routine x 2)     Status: None (Preliminary result)   Collection Time: 11/06/20  1:45 PM   Specimen: BLOOD  Result Value Ref Range Status   Specimen Description   Final    BLOOD PORTA CATH Performed at [facility] [address]  [place], [place] [address]    Special Requests   Final    BOTTLES DRAWN AEROBIC AND ANAEROBIC Blood Culture results may not be optimal due to an excessive volume of blood received in culture bottles Performed at [facility] [address] [place], [place] [address]    Culture   Final    NO GROWTH < 24 HOURS Performed at [facility] [address], [place], [place] [address]    Report Status PENDING  Incomplete      Pertinent Lab seen by me: CBC Latest Ref Rng & Units 11/07/2020 11/06/2020 11/05/2020  WBC 4.0 - 10.5 K/uL 5.6 5.9 6.4  Hemoglobin 12.0 - 15.0 g/dL 9.7(L) 10.1(L) 10.4(L)  Hematocrit 36.0 - 46.0 % 29.0(L) 30.5(L) 31.5(L)  Platelets 150 - 400 K/uL 197 199 220  CMP Latest Ref Rng & Units 11/07/2020 11/06/2020 11/05/2020  Glucose 70 - 99 mg/dL 92 87 87  BUN 8 - 23 mg/dL 6(L) 5(L) 7(L)  Creatinine 0.44 - 1.00 mg/dL 0.65 0.65 0.60  Sodium 135 - 145 mmol/L 140 138 139  Potassium 3.5 - 5.1 mmol/L 3.2(L) 3.3(L) 3.2(L)  Chloride 98 - 111 mmol/L 110 110 108  CO2 22 - 32 mmol/L 24 25 25   Calcium 8.9 - 10.3 mg/dL 8.5(L) 8.4(L) 8.5(L)  Total Protein 6.5 - 8.1 g/dL 5.5(L) 5.6(L) 5.7(L)  Total Bilirubin 0.3 - 1.2 mg/dL 0.6 0.3 0.2(L)  Alkaline Phos 38 - 126 U/L 46 48 47  AST 15 - 41 U/L 24 17 14(L)  ALT 0 - 44 U/L 15 12 10      Pertinent Imagings/Other Imagings Plain films and CT images have been personally visualized and interpreted; radiology reports have been reviewed. Decision making incorporated into the Impression / Recommendations.  CT angio chest 11/05/20 FINDINGS: Cardiovascular: Satisfactory opacification of pulmonary arteries noted, and no pulmonary emboli identified. No evidence of thoracic aortic dissection or aneurysm. Aortic and coronary atherosclerotic calcification noted.   Mediastinum/Nodes: Stable diffuse goiter. No pathologically enlarged lymph nodes identified.   Lungs/Pleura: Scarring/post treatment change is again seen in  the left lower lobe. No discrete mass identified. No other suspicious pulmonary nodules or masses demonstrated. No evidence of pulmonary consolidation.   Upper abdomen: No acute findings.   Musculoskeletal: No suspicious bone lesions identified.   Review of the MIP images confirms the above findings.   IMPRESSION: No evidence of pulmonary embolism or other acute findings.   Stable left lower lobe scarring/post treatment change. No evidence of recurrent or metastatic carcinoma.   Stable diffuse goiter.  CT chest 11/04/20 FINDINGS: Cardiovascular: Right anterior chest wall Port-A-Cath is present with tip terminating in the superior vena cava. Normal heart size. Coronary arterial vascular calcifications. Trace fluid superior pericardial recess. Thoracic aortic vascular calcifications. There is a possible small filling defect within a distal subsegmental right lower lobe pulmonary artery (image 82; series 3).   Mediastinum/Nodes: No enlarged axillary, mediastinal or hilar lymphadenopathy. Normal appearance of the esophagus. Persistent enlarged heterogeneous thyroid.   Lungs/Pleura: Central airways are patent. Continued interval decrease in size of left lower lobe nodule measuring 1.6 x 1.7 cm (image 98; series 8), previously 2.1 x 2.2 cm. Slight interval increase in patchy consolidative opacities within the more peripheral left lower lobe. Interval development of small bilateral pleural effusions. Minimal right basilar atelectasis. No pneumothorax. Stable 3 mm peripheral right middle lobe nodule (image 74; series 8).   Upper Abdomen: No acute process.   Musculoskeletal: Thoracic spine degenerative changes. No aggressive or acute appearing osseous lesions.   IMPRESSION: 1. Suggestion of a small filling defect within a peripheral right lower lobe subsegmental pulmonary artery, potentially representing distal pulmonary embolus versus artifact. If clinically  warranted, consider further evaluation with PE CT. 2. Interval decrease in size of left lower lobe nodule. 3. Interval development of patchy consolidative opacities within the left lower lobe which may represent atelectasis or infection. 4. Small bilateral pleural effusions. 5. Enlarged heterogeneous thyroid. Recommend thyroid ultrasound if and when clinically appropriate in the setting of metastatic malignancy. 6. Stable 3 mm nodule peripheral right middle lobe. Recommend continued attention on follow-up. 7. These results were called by telephone at the time of interpretation on 11/05/2020 at 8:59 am to provider A Cephus Slater , who verbally acknowledged these results.  Aortic Atherosclerosis (ICD10-I70.0). I have spent more than  70  minutes for this patient encounter including review of prior medical records with greater than 50% of time being face to face and coordination of their care.  Electronically signed by:   Rosiland Oz, MD Infectious Disease Physician [facility] for Infectious Disease Pager: [phone]

## 2020-11-06 NOTE — Progress Notes (Signed)
PROGRESS NOTE    Angelica [name]  NWG:[id number] DOB: 1941-04-18 DOA: 11/02/2020 PCP: Angelica Borg, MD  Chief Complaint  Patient presents with   Fever   Brief Narrative:  Angelica [name] is Angelica [name] 80 y.o. 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  No paraspinous was called and she was subsequently sent to the emergency department.  Per ED reports, she continued to have persistent fever, her Tmax has been 102.9 here.Patient herself does not have any other complaint such as chest pain, shortness of breath, nausea, vomiting, any problem with urination or bowel movements, cough, shortness of breath or any other complaint.   ED Course: Upon arrival to ED, her temperature was 102.9, she was slightly tachycardic.  Mild leukocytosis on CBC.  Normal CMP and normal lactic acid.  No pathology on the chest x-ray.  ED physician discussed the case with patient's primary oncologist Angelica [name] who opined that patient likely had fever secondary to immunotherapy/chemotherapy however he recommended keeping in the hospital for observation and continued antibiotics and ruling out any underlying infection.  Patient received cefepime and vancomycin in the ED hospital service were consulted to admit the patient for further management.    Assessment & Plan:   Active Problems:   SIRS (systemic inflammatory response syndrome) (HCC)   Bacteremia due to Klebsiella pneumoniae   Fever  Sepsis 2/2 Klebsiella Pneumoniae Bacteremia: Patient meets SIRS criteria based off of fever, tachycardia and leukocytosis.   Transitioned to ceftriaxone 6/16 - 6/18.  Narrow to ancef. Blood cultures with klebsiella, sensitive to ancef.  She has penicillin allergy (unclear reaction). Urine culture multiple specieis (UA not  c/w UTI) Unclear source at this time, repeat cultures with one off port.  Appreciate ID assistance.  Concern for PE vs artifact Negative CT PE protocol for PE Negative LE [place]   Leukocytosis 2/2 steroids and infection  Essential hypertension: continue to hold BP meds   Non-small cell lung carcinoma: Follows with Angelica [name] as outpatient.  S/p immunotherapy 6/15 (also had dose of steroids).  Further management per oncology. CT with contrast with interval decrease in size of LLL nodule, interval development of patchy consolidative opacities within the LLL which may represent atelecatsis or infection.  Small bilateral effusions.  Stable 3 mm nodule peripheral R middle lobe.  Enlarged heterogenous Thyroid Needs thyroid US outpatient  Asthma dulera Albuterol prn Singulair  Shoulder Pain  Add voltaren Tramadol prn  DVT prophylaxis: lovenox Code Status: full  Family Communication: daughter over phone and at bedside Disposition:   Status is: Observation  The patient will require care spanning > 2 midnights and should be moved to inpatient because: Inpatient level of care appropriate due to severity of illness  Dispo: The patient is from: Home              Anticipated d/c is to: Home              Patient currently is not medically stable to d/c.   Difficult to place patient No       Consultants:  none  Procedures:  none  Antimicrobials:  Anti-infectives (From admission, onward)    Start     Dose/Rate Route Frequency Ordered Stop   11/06/20 0600  ceFAZolin (ANCEF) IVPB 2g/100 mL premix        2  g 200 mL/hr over 30 Minutes Intravenous Every 8 hours 11/05/20 1321     11/03/20 1400  vancomycin (VANCOCIN) IVPB 1000 mg/200 mL premix  Status:  Discontinued        1,000 mg 200 mL/hr over 60 Minutes Intravenous Every 24 hours 11/02/20 1631 11/03/20 0831   11/03/20 0630  cefTRIAXone (ROCEPHIN) 2 g in sodium chloride 0.9 % 100 mL IVPB  Status:  Discontinued        2 g 200  mL/hr over 30 Minutes Intravenous Every 24 hours 11/03/20 0537 11/05/20 1320   11/02/20 2200  ceFEPIme (MAXIPIME) 2 g in sodium chloride 0.9 % 100 mL IVPB  Status:  Discontinued        2 g 200 mL/hr over 30 Minutes Intravenous Every 12 hours 11/02/20 1626 11/03/20 0537   11/02/20 1400  vancomycin (VANCOREADY) IVPB 1250 mg/250 mL        1,250 mg 166.7 mL/hr over 90 Minutes Intravenous  Once 11/02/20 1331 11/02/20 1658   11/02/20 1330  ceFEPIme (MAXIPIME) 2 g in sodium chloride 0.9 % 100 mL IVPB        2 g 200 mL/hr over 30 Minutes Intravenous  Once 11/02/20 1320 11/02/20 1530   11/02/20 1330  metroNIDAZOLE (FLAGYL) IVPB 500 mg        500 mg 100 mL/hr over 60 Minutes Intravenous  Once 11/02/20 1320 11/02/20 1530   11/02/20 1330  vancomycin (VANCOCIN) IVPB 1000 mg/200 mL premix  Status:  Discontinued        1,000 mg 200 mL/hr over 60 Minutes Intravenous  Once 11/02/20 1320 11/02/20 1329         Subjective: No new complaints  Objective: Vitals:   11/05/20 2039 11/06/20 0552 11/06/20 0819 11/06/20 1242  BP: 131/80 128/78  (!) 141/81  Pulse: 68 67  77  Resp: 16 17    Temp: 98.7 F (37.1 C) 98.3 F (36.8 C)  (!) 97.5 F (36.4 C)  TempSrc: Oral Oral  Oral  SpO2: 96% 100% 94% 100%  Weight:      Height:        Intake/Output Summary (Last 24 hours) at 11/06/2020 1321 Last data filed at 11/06/2020 0900 Gross per 24 hour  Intake 2157.66 ml  Output --  Net 2157.66 ml   Filed Weights   11/02/20 1159  Weight: 66.9 kg    Examination:  General: No acute distress. Cardiovascular: Heart sounds show Angelica [name] regular rate, and rhythm.  Lungs: Clear to auscultation bilaterally  Abdomen: Soft, nontender, nondistended  Neurological: Alert and oriented 3. Moves all extremities 4. Cranial nerves II through XII grossly intact. Skin: Warm and dry. No rashes or lesions. Extremities: No clubbing or cyanosis. No edema.   Data Reviewed: I have personally reviewed following labs and imaging  studies  CBC: Recent Labs  Lab 11/02/20 0818 11/02/20 1217 11/03/20 0428 11/04/20 0514 11/05/20 0745 11/06/20 0605  WBC 7.1 13.0* 22.3* 12.6* 6.4 5.9  NEUTROABS 5.3 12.6*  --  10.5* 4.8 4.3  HGB 11.1* 10.5* 10.2* 9.7* 10.4* 10.1*  HCT 33.2* 31.6* 30.7* 28.7* 31.5* 30.5*  MCV 90.5 92.1 92.7 92.3 92.1 92.1  PLT 220 192 191 183 220 527    Basic Metabolic Panel: Recent Labs  Lab 11/02/20 1217 11/03/20 0428 11/04/20 0514 11/05/20 0745 11/06/20 0605  NA 135 138 139 139 138  K 3.6 3.7 3.5 3.2* 3.3*  CL 106 110 111 108 110  CO2 23 21* 21* 25 25  GLUCOSE 125* 143*  84 87 87  BUN 12 13 11  7* 5*  CREATININE 0.76 0.85 0.77 0.60 0.65  CALCIUM 8.4* 8.7* 8.5* 8.5* 8.4*  MG 1.6*  --  1.9 1.5* 1.6*  PHOS  --  3.5  --  2.8 3.3    GFR: Estimated Creatinine Clearance: 50.5 mL/min (by C-G formula based on SCr of 0.65 mg/dL).  Liver Function Tests: Recent Labs  Lab 11/02/20 0818 11/02/20 1217 11/04/20 0514 11/05/20 0745 11/06/20 0605  AST 18 19 13* 14* 17  ALT 9 11 11 10 12   ALKPHOS 70 59 49 47 48  BILITOT 0.3 0.4 0.2* 0.2* 0.3  PROT 7.1 6.4* 5.7* 5.7* 5.6*  ALBUMIN 3.1* 3.1* 2.5* 2.7* 2.7*    CBG: No results for input(s): GLUCAP in the last 168 hours.   Recent Results (from the past 240 hour(s))  Blood Culture (routine x 2)     Status: Abnormal   Collection Time: 11/02/20 12:17 PM   Specimen: BLOOD  Result Value Ref Range Status   Specimen Description   Final    BLOOD RIGHT ANTECUBITAL Performed at [facility] [address], [place], [place] [address]    Special Requests   Final    BOTTLES DRAWN AEROBIC AND ANAEROBIC Blood Culture results may not be optimal due to an inadequate volume of blood received in culture bottles Performed at [facility] [address], [place], [place] [address]    Culture  Setup Time   Final    GRAM NEGATIVE RODS IN BOTH AEROBIC AND ANAEROBIC BOTTLES CRITICAL VALUE NOTED.  VALUE IS CONSISTENT  WITH PREVIOUSLY REPORTED AND CALLED VALUE.    Culture (Allina Riches)  Final    KLEBSIELLA PNEUMONIAE SUSCEPTIBILITIES PERFORMED ON PREVIOUS CULTURE WITHIN THE LAST 5 DAYS. Performed at [facility], [place] [address] [place], [place] [address]    Report Status 11/05/2020 FINAL  Final  Blood Culture (routine x 2)     Status: Abnormal   Collection Time: 11/02/20 12:17 PM   Specimen: BLOOD  Result Value Ref Range Status   Specimen Description   Final    BLOOD LEFT ANTECUBITAL Performed at [facility] [address], [place], [place] [address]    Special Requests   Final    BOTTLES DRAWN AEROBIC AND ANAEROBIC Blood Culture results may not be optimal due to an inadequate volume of blood received in culture bottles Performed at [facility] [address], [place], [place] [address]    Culture  Setup Time   Final    GRAM NEGATIVE RODS IN BOTH AEROBIC AND ANAEROBIC BOTTLES CRITICAL RESULT CALLED TO, READ BACK BY AND VERIFIED WITH: Len Childs [id number] AT 530 AM Performed at [facility], [place] [address] [place], [place] [address]    Culture KLEBSIELLA PNEUMONIAE (Ahmaad Neidhardt)  Final   Report Status 11/05/2020 FINAL  Final   Organism ID, Bacteria KLEBSIELLA PNEUMONIAE  Final      Susceptibility   Klebsiella pneumoniae - MIC*    AMPICILLIN >=32 RESISTANT Resistant     CEFAZOLIN <=4 SENSITIVE Sensitive     CEFEPIME <=0.12 SENSITIVE Sensitive     CEFTAZIDIME <=1 SENSITIVE Sensitive     CEFTRIAXONE <=0.25 SENSITIVE Sensitive     CIPROFLOXACIN <=0.25 SENSITIVE Sensitive     GENTAMICIN <=1 SENSITIVE Sensitive     IMIPENEM <=0.25 SENSITIVE Sensitive     TRIMETH/SULFA <=20 SENSITIVE Sensitive     AMPICILLIN/SULBACTAM 8 SENSITIVE Sensitive  PIP/TAZO <=4 SENSITIVE Sensitive     * KLEBSIELLA PNEUMONIAE  Blood Culture ID Panel (Reflexed)     Status: Abnormal   Collection Time: 11/02/20 12:17 PM  Result Value Ref Range Status   Enterococcus faecalis NOT  DETECTED NOT DETECTED Final   Enterococcus Faecium NOT DETECTED NOT DETECTED Final   Listeria monocytogenes NOT DETECTED NOT DETECTED Final   Staphylococcus species NOT DETECTED NOT DETECTED Final   Staphylococcus aureus (BCID) NOT DETECTED NOT DETECTED Final   Staphylococcus epidermidis NOT DETECTED NOT DETECTED Final   Staphylococcus lugdunensis NOT DETECTED NOT DETECTED Final   Streptococcus species NOT DETECTED NOT DETECTED Final   Streptococcus agalactiae NOT DETECTED NOT DETECTED Final   Streptococcus pneumoniae NOT DETECTED NOT DETECTED Final   Streptococcus pyogenes NOT DETECTED NOT DETECTED Final   Jacey Eckerson.calcoaceticus-baumannii NOT DETECTED NOT DETECTED Final   Bacteroides fragilis NOT DETECTED NOT DETECTED Final   Enterobacterales DETECTED (Inigo Lantigua) NOT DETECTED Final    Comment: Enterobacterales represent Weylin Plagge large order of gram negative bacteria, not Alyjah Lovingood single organism. CRITICAL RESULT CALLED TO, READ BACK BY AND VERIFIED WITH: [place], AT [phone] 11/03/20 D. VANHOOK    Enterobacter cloacae complex NOT DETECTED NOT DETECTED Final   Escherichia coli NOT DETECTED NOT DETECTED Final   Klebsiella aerogenes NOT DETECTED NOT DETECTED Final   Klebsiella oxytoca NOT DETECTED NOT DETECTED Final   Klebsiella pneumoniae DETECTED (Mason Burleigh) NOT DETECTED Final    Comment: CRITICAL RESULT CALLED TO, READ BACK BY AND VERIFIED WITH: Seleta Rhymes PHARMD, AT [phone] 11/03/20 D. VANHOOK    Proteus species NOT DETECTED NOT DETECTED Final   Salmonella species NOT DETECTED NOT DETECTED Final   Serratia marcescens NOT DETECTED NOT DETECTED Final   Haemophilus influenzae NOT DETECTED NOT DETECTED Final   Neisseria meningitidis NOT DETECTED NOT DETECTED Final   Pseudomonas aeruginosa NOT DETECTED NOT DETECTED Final   Stenotrophomonas maltophilia NOT DETECTED NOT DETECTED Final   Candida albicans NOT DETECTED NOT DETECTED Final   Candida auris NOT DETECTED NOT DETECTED Final   Candida glabrata NOT DETECTED NOT  DETECTED Final   Candida krusei NOT DETECTED NOT DETECTED Final   Candida parapsilosis NOT DETECTED NOT DETECTED Final   Candida tropicalis NOT DETECTED NOT DETECTED Final   Cryptococcus neoformans/gattii NOT DETECTED NOT DETECTED Final   CTX-M ESBL NOT DETECTED NOT DETECTED Final   Carbapenem resistance IMP NOT DETECTED NOT DETECTED Final   Carbapenem resistance KPC NOT DETECTED NOT DETECTED Final   Carbapenem resistance NDM NOT DETECTED NOT DETECTED Final   Carbapenem resist OXA 48 LIKE NOT DETECTED NOT DETECTED Final   Carbapenem resistance VIM NOT DETECTED NOT DETECTED Final    Comment: Performed at [facility], [address], [place], [place] [address]  Resp Panel by RT-PCR (Flu Addis Tuohy&B, Covid) Nasopharyngeal Swab     Status: None   Collection Time: 11/02/20  1:10 PM   Specimen: Nasopharyngeal Swab; Nasopharyngeal(NP) swabs in vial transport medium  Result Value Ref Range Status   SARS Coronavirus 2 by RT PCR NEGATIVE NEGATIVE Final    Comment: (NOTE) SARS-CoV-2 target nucleic acids are NOT DETECTED.  The SARS-CoV-2 RNA is generally detectable in upper respiratory specimens during the acute phase of infection. The lowest concentration of SARS-CoV-2 viral copies this assay can detect is 138 copies/mL. Zoraida Havrilla negative result does not preclude SARS-Cov-2 infection and should not be used as the sole basis for treatment or other patient management decisions. Blaire Palomino negative result may occur with  improper specimen  collection/handling, submission of specimen other than nasopharyngeal swab, presence of viral mutation(s) within the areas targeted by this assay, and inadequate number of viral copies(<138 copies/mL). Deloris Moger negative result must be combined with clinical observations, patient history, and epidemiological information. The expected result is Negative.  Fact Sheet for Patients:  EntrepreneurPulse.com.au  Fact Sheet for Healthcare Providers:   IncredibleEmployment.be  This test is no t yet approved or cleared by the [place] FDA and  has been authorized for detection and/or diagnosis of SARS-CoV-2 by FDA under an Emergency Use Authorization (EUA). This EUA will remain  in effect (meaning this test can be used) for the duration of the COVID-19 declaration under Section 564(b)(1) of the Act, 21 U.S.C.section 360bbb-3(b)(1), unless the authorization is terminated  or revoked sooner.       Influenza Demitrius Crass by PCR NEGATIVE NEGATIVE Final   Influenza B by PCR NEGATIVE NEGATIVE Final    Comment: (NOTE) The Xpert Xpress SARS-CoV-2/FLU/RSV plus assay is intended as an aid in the diagnosis of influenza from Nasopharyngeal swab specimens and should not be used as Clotiel Troop sole basis for treatment. Nasal washings and aspirates are unacceptable for Xpert Xpress SARS-CoV-2/FLU/RSV testing.  Fact Sheet for Patients: EntrepreneurPulse.com.au  Fact Sheet for Healthcare Providers: IncredibleEmployment.be  This test is not yet approved or cleared by the [place] FDA and has been authorized for detection and/or diagnosis of SARS-CoV-2 by FDA under an Emergency Use Authorization (EUA). This EUA will remain in effect (meaning this test can be used) for the duration of the COVID-19 declaration under Section 564(b)(1) of the Act, 21 U.S.C. section 360bbb-3(b)(1), unless the authorization is terminated or revoked.  Performed at [facility], [place] [address], [place], [place] [address]   Urine culture     Status: Abnormal   Collection Time: 11/02/20  3:35 PM   Specimen: In/Out Cath Urine  Result Value Ref Range Status   Specimen Description   Final    IN/OUT CATH URINE Performed at [facility] [address] [place], [place] [address]    Special Requests   Final    NONE Performed at [facility], [place] [address],  [place], [place] [address]    Culture MULTIPLE SPECIES PRESENT, SUGGEST RECOLLECTION (Brent Noto)  Final   Report Status 11/04/2020 FINAL  Final         Radiology Studies: CT CHEST W CONTRAST  Result Date: 11/05/2020 CLINICAL DATA:  Non-small cell lung cancer on immunotherapy. Patient with fever. EXAM: CT CHEST WITH CONTRAST TECHNIQUE: Multidetector CT imaging of the chest was performed during intravenous contrast administration. CONTRAST:  92mL OMNIPAQUE IOHEXOL 300 MG/ML  SOLN COMPARISON:  CT chest 08/29/2020. FINDINGS: Cardiovascular: Right anterior chest wall [place] Santarelli-Cath is present with tip terminating in the superior vena cava. Normal heart size. Coronary arterial vascular calcifications. Trace fluid superior pericardial recess. Thoracic aortic vascular calcifications. There is Kamera Dubas possible small filling defect within Burnett Spray distal subsegmental right lower lobe pulmonary artery (image 82; series 3). Mediastinum/Nodes: No enlarged axillary, mediastinal or hilar lymphadenopathy. Normal appearance of the esophagus. Persistent enlarged heterogeneous thyroid. Lungs/Pleura: Central airways are patent. Continued interval decrease in size of left lower lobe nodule measuring 1.6 x 1.7 cm (image 98; series 8), previously 2.1 x 2.2 cm. Slight interval increase in patchy consolidative opacities within the more peripheral left lower lobe. Interval development of small bilateral pleural effusions. Minimal right basilar atelectasis. No pneumothorax. Stable 3 mm peripheral right middle lobe nodule (image 74; series 8). Upper  Abdomen: No acute process. Musculoskeletal: Thoracic spine degenerative changes. No aggressive or acute appearing osseous lesions. IMPRESSION: 1. Suggestion of Quincee Gittens small filling defect within Kaydense Rizo peripheral right lower lobe subsegmental pulmonary artery, potentially representing distal pulmonary embolus versus artifact. If clinically warranted, consider further evaluation with PE CT. 2. Interval decrease in size of  left lower lobe nodule. 3. Interval development of patchy consolidative opacities within the left lower lobe which may represent atelectasis or infection. 4. Small bilateral pleural effusions. 5. Enlarged heterogeneous thyroid. Recommend thyroid ultrasound if and when clinically appropriate in the setting of metastatic malignancy. 6. Stable 3 mm nodule peripheral right middle lobe. Recommend continued attention on follow-up. 7. These results were called by telephone at the time of interpretation on 11/05/2020 at 8:59 am to provider Robert Sunga Cephus Slater , who verbally acknowledged these results. Electronically Signed   By: Lovey Newcomer M.D.   On: 11/05/2020 09:00   CT Angio Chest Pulmonary Embolism (PE) W or WO Contrast  Result Date: 11/05/2020 CLINICAL DATA:  Positive D-dimer. Non-small cell lung carcinoma undergoing immunotherapy. EXAM: CT ANGIOGRAPHY CHEST WITH CONTRAST TECHNIQUE: Multidetector CT imaging of the chest was performed using the standard protocol during bolus administration of intravenous contrast. Multiplanar CT image reconstructions and MIPs were obtained to evaluate the vascular anatomy. CONTRAST:  55mL OMNIPAQUE IOHEXOL 350 MG/ML SOLN COMPARISON:  Routine chest CT on 11/04/2020 FINDINGS: Cardiovascular: Satisfactory opacification of pulmonary arteries noted, and no pulmonary emboli identified. No evidence of thoracic aortic dissection or aneurysm. Aortic and coronary atherosclerotic calcification noted. Mediastinum/Nodes: Stable diffuse goiter. No pathologically enlarged lymph nodes identified. Lungs/Pleura: Scarring/post treatment change is again seen in the left lower lobe. No discrete mass identified. No other suspicious pulmonary nodules or masses demonstrated. No evidence of pulmonary consolidation. Upper abdomen: No acute findings. Musculoskeletal: No suspicious bone lesions identified. Review of the MIP images confirms the above findings. IMPRESSION: No evidence of pulmonary embolism or other  acute findings. Stable left lower lobe scarring/post treatment change. No evidence of recurrent or metastatic carcinoma. Stable diffuse goiter. Aortic Atherosclerosis (ICD10-I70.0). Electronically Signed   By: Marlaine Hind M.D.   On: 11/05/2020 11:16   VAS [place] LOWER EXTREMITY VENOUS (DVT)  Result Date: 11/05/2020  Lower Venous DVT Study Patient Name:  LEXY MEININGER  Date of Exam:   11/05/2020 Medical Rec #: [id number]         Accession #:    [id number] Date of Birth: Oct 13, 1940          Patient Gender: F Patient Age:   79Y Exam Location:  [facility] Procedure:      VAS [place] LOWER EXTREMITY VENOUS (DVT) Referring Phys: [id number] Fathima Bartl CALDWELL POWELL JR --------------------------------------------------------------------------------  Indications: "swelling" Per MD order.  Comparison Study: Previous exam 08/22/16 (RLE) - negative Performing Technologist: Jody Hill RVT, RDMS  Examination Guidelines: Nate Common complete evaluation includes B-mode imaging, spectral Doppler, color Doppler, and power Doppler as needed of all accessible portions of each vessel. Bilateral testing is considered an integral part of Jacori Mulrooney complete examination. Limited examinations for reoccurring indications may be performed as noted. The reflux portion of the exam is performed with the patient in reverse Trendelenburg.  +---------+---------------+---------+-----------+----------+--------------+ RIGHT    CompressibilityPhasicitySpontaneityPropertiesThrombus Aging +---------+---------------+---------+-----------+----------+--------------+ CFV      Full           Yes      Yes                                 +---------+---------------+---------+-----------+----------+--------------+  SFJ      Full                                                        +---------+---------------+---------+-----------+----------+--------------+ FV Prox  Full           Yes      Yes                                  +---------+---------------+---------+-----------+----------+--------------+ FV Mid   Full           Yes      Yes                                 +---------+---------------+---------+-----------+----------+--------------+ FV DistalFull           Yes      Yes                                 +---------+---------------+---------+-----------+----------+--------------+ PFV      Full                                                        +---------+---------------+---------+-----------+----------+--------------+ POP      Full           Yes      Yes                                 +---------+---------------+---------+-----------+----------+--------------+ PTV      Full                                                        +---------+---------------+---------+-----------+----------+--------------+ PERO     Full                                                        +---------+---------------+---------+-----------+----------+--------------+   +---------+---------------+---------+-----------+----------+--------------+ LEFT     CompressibilityPhasicitySpontaneityPropertiesThrombus Aging +---------+---------------+---------+-----------+----------+--------------+ CFV      Full           Yes      Yes                                 +---------+---------------+---------+-----------+----------+--------------+ SFJ      Full                                                        +---------+---------------+---------+-----------+----------+--------------+  FV Prox  Full           Yes      Yes                                 +---------+---------------+---------+-----------+----------+--------------+ FV Mid   Full           Yes      Yes                                 +---------+---------------+---------+-----------+----------+--------------+ FV DistalFull           Yes      Yes                                  +---------+---------------+---------+-----------+----------+--------------+ PFV      Full                                                        +---------+---------------+---------+-----------+----------+--------------+ POP      Full           Yes      Yes                                 +---------+---------------+---------+-----------+----------+--------------+ PTV      Full                                                        +---------+---------------+---------+-----------+----------+--------------+ PERO     Full                                                        +---------+---------------+---------+-----------+----------+--------------+     Summary: BILATERAL: - No evidence of deep vein thrombosis seen in the lower extremities, bilaterally. - No evidence of superficial venous thrombosis in the lower extremities, bilaterally. -No evidence of popliteal cyst, bilaterally.   *See table(s) above for measurements and observations. Electronically signed by Harold Barban MD on 11/05/2020 at 11:35:57 PM.    Final         Scheduled Meds:  acetaminophen  650 mg Oral Once   Chlorhexidine Gluconate Cloth  6 each Topical Daily   cholecalciferol  2,000 Units Oral Daily   enoxaparin (LOVENOX) injection  40 mg Subcutaneous Daily   loratadine  10 mg Oral Daily   mometasone-formoterol  2 puff Inhalation BID   montelukast  10 mg Oral QHS   pravastatin  80 mg Oral Daily   sodium chloride flush  10-40 mL Intracatheter Q12H   Continuous Infusions:  sodium chloride 75 mL/hr at 11/05/20 2125    ceFAZolin (ANCEF) IV 2 g (11/06/20 0558)     LOS: 3 days    Time spent: over 30 min    Fayrene Helper, MD [facility]  To contact the attending provider between 7A-7P or the covering provider during after hours 7P-7A, please log into the web site [email or web address] and access using universal  password for that web site. If you do not have the password, please call  the hospital operator.  11/06/2020, 1:21 PM

## 2020-11-07 ENCOUNTER — Inpatient Hospital Stay (HOSPITAL_COMMUNITY): Payer: Medicare HMO

## 2020-11-07 DIAGNOSIS — R7881 Bacteremia: Secondary | ICD-10-CM | POA: Diagnosis not present

## 2020-11-07 DIAGNOSIS — C3490 Malignant neoplasm of unspecified part of unspecified bronchus or lung: Secondary | ICD-10-CM

## 2020-11-07 DIAGNOSIS — R509 Fever, unspecified: Secondary | ICD-10-CM | POA: Diagnosis not present

## 2020-11-07 DIAGNOSIS — B961 Klebsiella pneumoniae [K. pneumoniae] as the cause of diseases classified elsewhere: Secondary | ICD-10-CM | POA: Diagnosis not present

## 2020-11-07 LAB — COMPREHENSIVE METABOLIC PANEL
ALT: 15 U/L (ref 0–44)
AST: 24 U/L (ref 15–41)
Albumin: 2.7 g/dL — ABNORMAL LOW (ref 3.5–5.0)
Alkaline Phosphatase: 46 U/L (ref 38–126)
Anion gap: 6 (ref 5–15)
BUN: 6 mg/dL — ABNORMAL LOW (ref 8–23)
CO2: 24 mmol/L (ref 22–32)
Calcium: 8.5 mg/dL — ABNORMAL LOW (ref 8.9–10.3)
Chloride: 110 mmol/L (ref 98–111)
Creatinine, Ser: 0.65 mg/dL (ref 0.44–1.00)
GFR, Estimated: >60 mL/min (ref >60)
Glucose, Bld: 92 mg/dL (ref 70–99)
Potassium: 3.2 mmol/L — ABNORMAL LOW (ref 3.5–5.1)
Sodium: 140 mmol/L (ref 135–145)
Total Bilirubin: 0.6 mg/dL (ref 0.3–1.2)
Total Protein: 5.5 g/dL — ABNORMAL LOW (ref 6.5–8.1)

## 2020-11-07 LAB — CBC WITH DIFFERENTIAL/PLATELET
Abs Immature Granulocytes: 0.05 10*3/uL (ref 0.00–0.07)
Basophils Absolute: 0 10*3/uL (ref 0.0–0.1)
Basophils Relative: 1 %
Eosinophils Absolute: 0 10*3/uL (ref 0.0–0.5)
Eosinophils Relative: 0 %
HCT: 29 % — ABNORMAL LOW (ref 36.0–46.0)
Hemoglobin: 9.7 g/dL — ABNORMAL LOW (ref 12.0–15.0)
Immature Granulocytes: 1 %
Lymphocytes Relative: 20 %
Lymphs Abs: 1.1 10*3/uL (ref 0.7–4.0)
MCH: 30.8 pg (ref 26.0–34.0)
MCHC: 33.4 g/dL (ref 30.0–36.0)
MCV: 92.1 fL (ref 80.0–100.0)
Monocytes Absolute: 0.5 10*3/uL (ref 0.1–1.0)
Monocytes Relative: 10 %
Neutro Abs: 3.8 10*3/uL (ref 1.7–7.7)
Neutrophils Relative %: 68 %
Platelets: 197 10*3/uL (ref 150–400)
RBC: 3.15 MIL/uL — ABNORMAL LOW (ref 3.87–5.11)
RDW: 13 % (ref 11.5–15.5)
WBC: 5.6 10*3/uL (ref 4.0–10.5)
nRBC: 0 % (ref 0.0–0.2)

## 2020-11-07 LAB — PHOSPHORUS: Phosphorus: 3.6 mg/dL (ref 2.5–4.6)

## 2020-11-07 LAB — SEDIMENTATION RATE: Sed Rate: 15 mm/hr (ref 0–22)

## 2020-11-07 LAB — C-REACTIVE PROTEIN: CRP: 0.6 mg/dL (ref <1.0)

## 2020-11-07 LAB — MAGNESIUM: Magnesium: 1.9 mg/dL (ref 1.7–2.4)

## 2020-11-07 IMAGING — CT CT ABD-PELV W/ CM
2 of 5 series · 15 of 46 positions shown, 17 images · IV contrast (APPLIED)
Comparison: Most recent comparison CT [DATE]. Chest CTA
[DATE].

CLINICAL DATA: Abdominal abscess/infection suspected

Air and chills, admitted with Klebsiella bacteremia. History of
non-small cell lung cancer, active chemotherapy.
EXAM:
CT ABDOMEN AND PELVIS WITH CONTRAST
TECHNIQUE: Multidetector CT imaging of the abdomen and pelvis was performed
using the standard protocol following bolus administration of
intravenous contrast.
CONTRAST:  100mL OMNIPAQUE IOHEXOL 300 MG/ML  SOLN

[Series 2: axial st · axial · 0.81mm/px · z∈[-633,-248]mm · 12 of 89 slices shown, 14 images]
[im 6/89  soft-tissue]
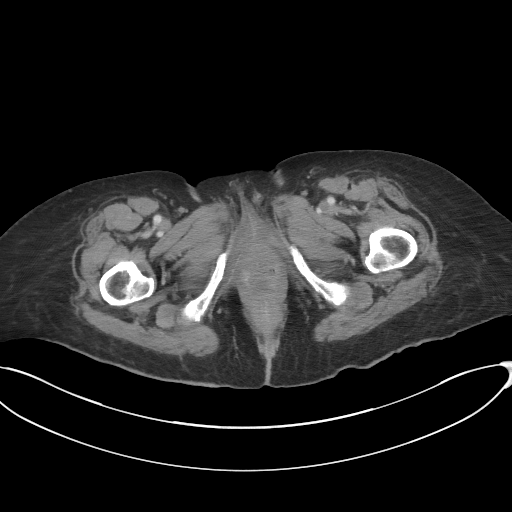
[im 6/89  bone]
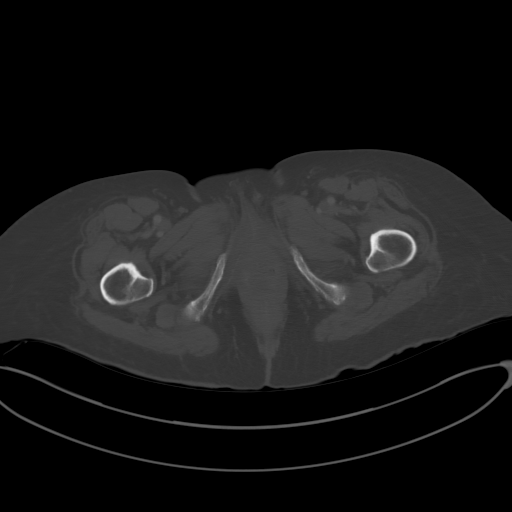
[im 12/89  soft-tissue]
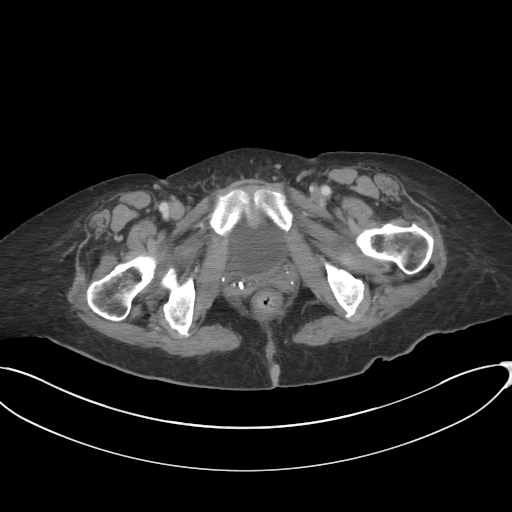
[im 18/89  soft-tissue]
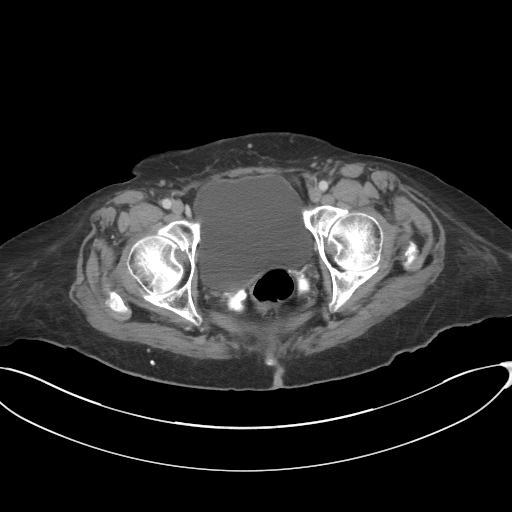
[im 30/89  soft-tissue]
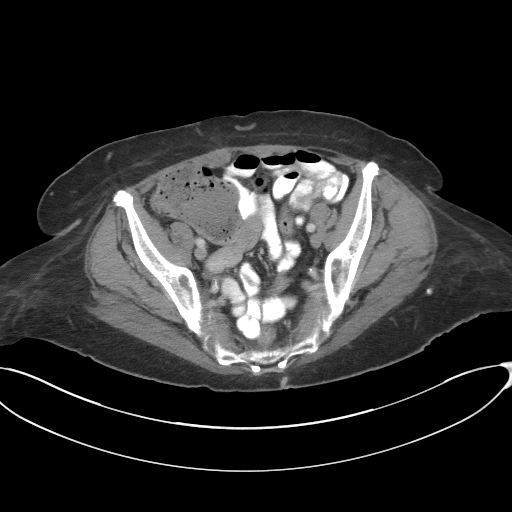
[im 36/89  soft-tissue]
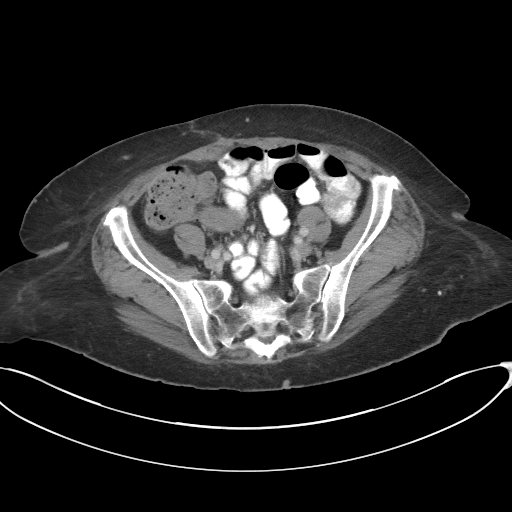
[im 42/89  soft-tissue]
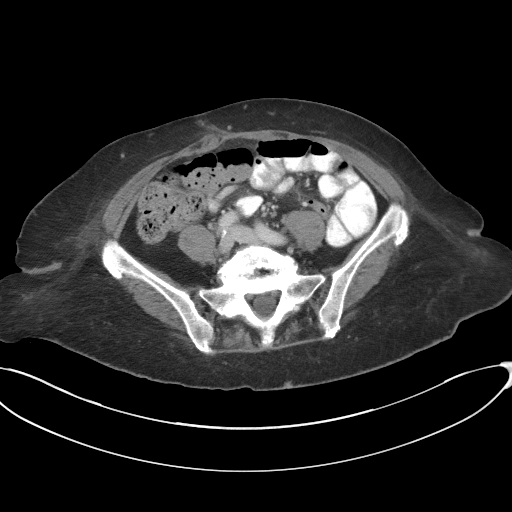
[im 47/89  soft-tissue]
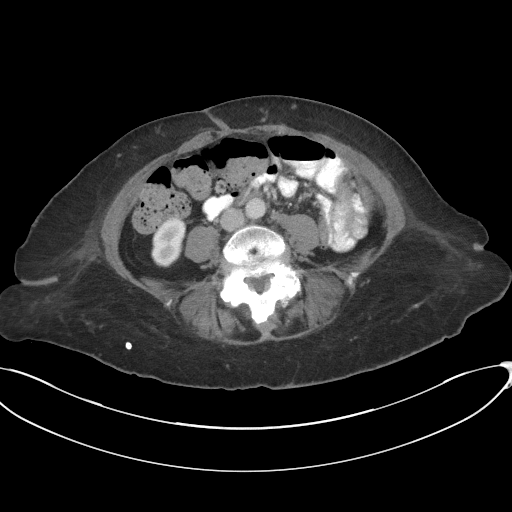
[im 53/89  soft-tissue]
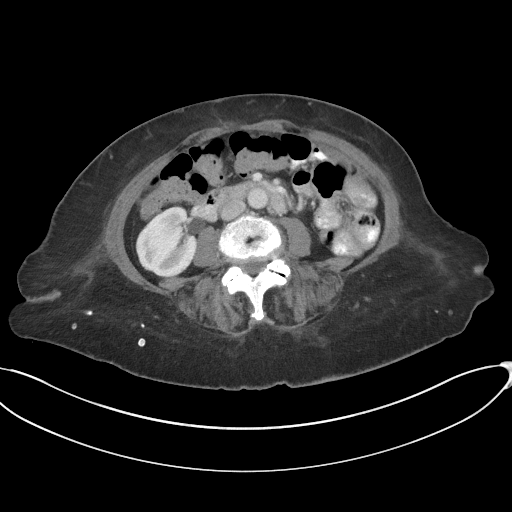
[im 59/89  soft-tissue]
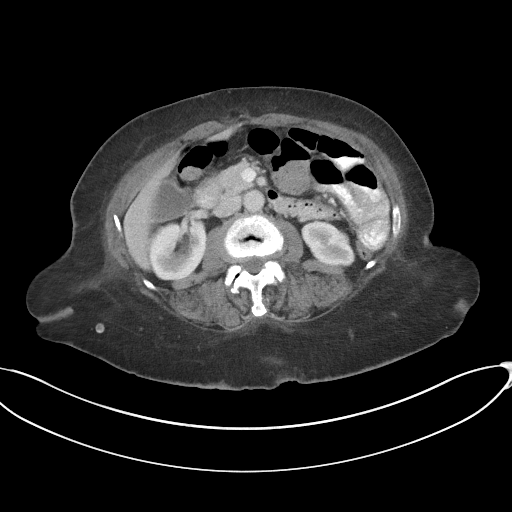
[im 59/89  bone]
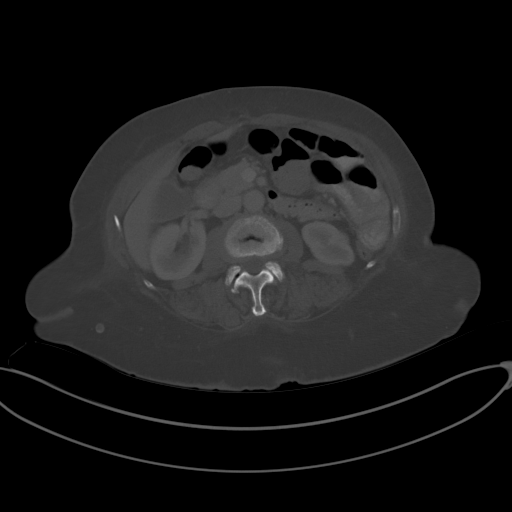
[im 71/89  soft-tissue]
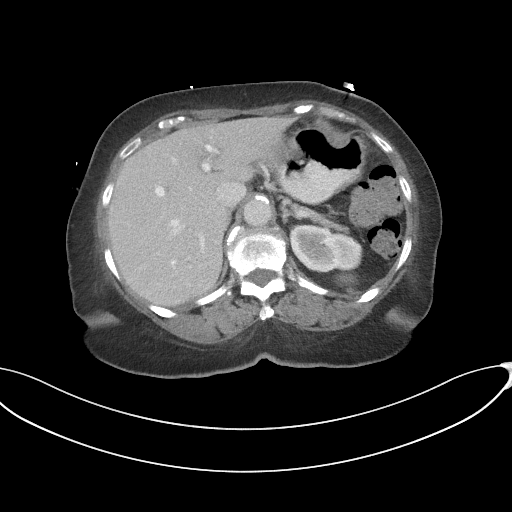
[im 77/89  soft-tissue]
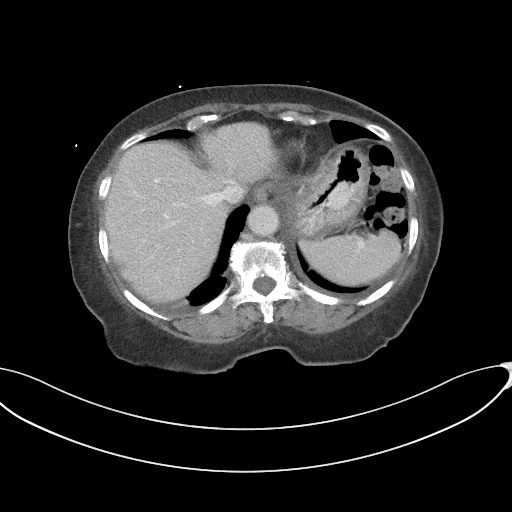
[im 83/89  soft-tissue]
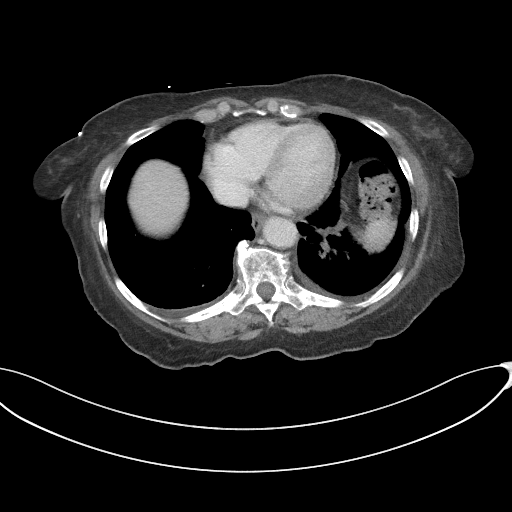

[Series 5: coronal st · coronal · 0.68mm/px · 3 of 87 slices shown]
[im 29/87  soft-tissue]
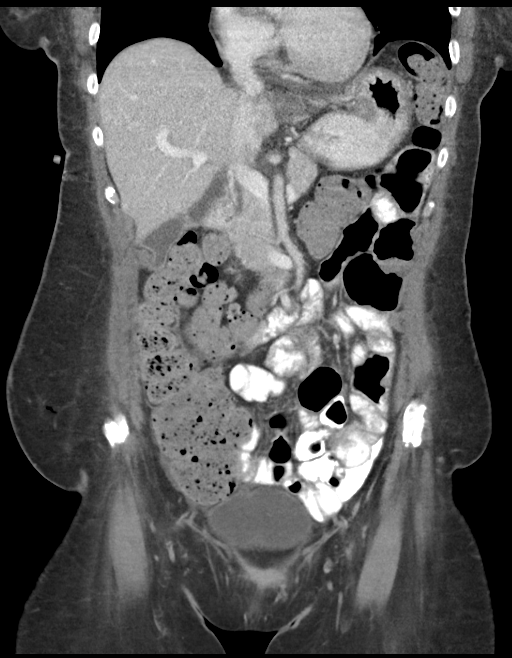
[im 39/87  soft-tissue]
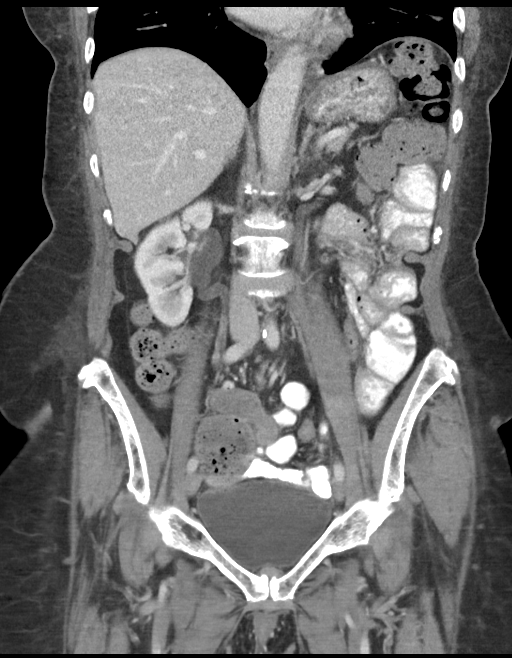
[im 48/87  soft-tissue]
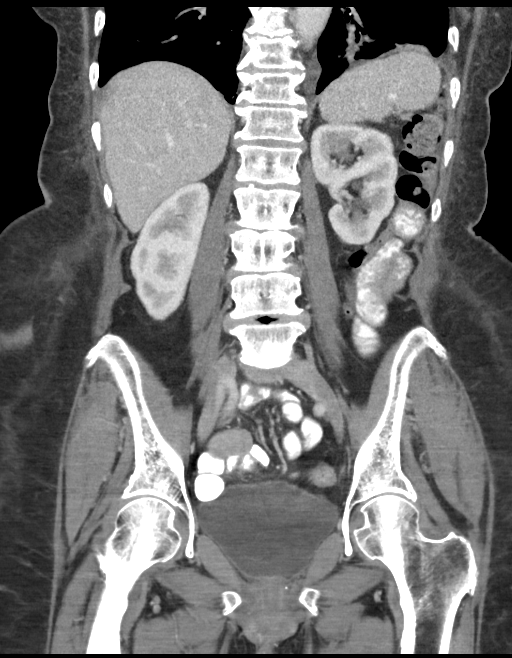

[15 of 46 positions shown; findings below may reference images not displayed]

FINDINGS: Lower chest: Trace right and small left pleural effusion. Spiculated
density in the left lower lobe is unchanged from recent chest CT.

Hepatobiliary: No focal liver lesion or liver abnormality. Partially
distended gallbladder without pericholecystic inflammation. Small
gallstones versus wall calcification within a Phrygian cap. No
biliary dilatation.

Pancreas: Mild parenchymal atrophy. No ductal dilatation or
inflammation.

Spleen: Normal in size without focal abnormality.

Adrenals/Urinary Tract: No adrenal nodule. Extrarenal pelvis
configuration of both kidneys. No hydronephrosis, perinephric edema,
or abnormal enhancement. Symmetric excretion on delayed phase
imaging. No focal renal abnormality. Unremarkable urinary bladder.

Stomach/Bowel: Partially distended stomach without gastric wall
thickening. Minimal distal small bowel diverticulosis. No small
bowel obstruction or inflammation. Administered enteric contrast
reaches the distal ileal bowel loops. Appendix not confidently
visualized. No appendicitis. Moderate volume of colonic stool. Mild
colonic redundancy. There is no colonic wall thickening or
inflammatory change.

Vascular/Lymphatic: Aortic atherosclerosis. No aortic aneurysm. No
acute aortic findings. Patent portal vein. No enlarged lymph nodes
in the abdomen or pelvis.

Reproductive: Hysterectomy.  No adnexal mass.

Other: Minimal presacral soft tissue edema is nonspecific. No
ascites or abscess. No free air. Postsurgical change of the anterior
abdominal wall with small fat containing umbilical hernia in the
upper abdomen.

Musculoskeletal: There are no acute or suspicious osseous
abnormalities. Multilevel degenerative change in the lumbar spine,
prominent multilevel facet hypertrophy. Diffuse degenerative disc
disease with vacuum phenomenon, disc space narrowing at L5-S1.
IMPRESSION: 1. No acute abnormality in the abdomen/pelvis. No evidence of
intra-abdominal infection or abscess.
2. Moderate colonic stool burden with mild colonic redundancy, can
be seen with constipation. No bowel obstruction or inflammation.
3. Small gallstones versus wall calcification within a Phrygian cap.
No pericholecystic inflammation.

Aortic Atherosclerosis ([G2]-[G2]).

## 2020-11-07 MED ORDER — IOHEXOL 9 MG/ML PO SOLN
500.0000 mL | ORAL | Status: AC
  Administered 2020-11-07 (×2): 500 mL via ORAL

## 2020-11-07 MED ORDER — IOHEXOL 9 MG/ML PO SOLN
ORAL | Status: AC
  Administered 2020-11-07: 500 mL
  Filled 2020-11-07: qty 1000

## 2020-11-07 MED ORDER — IOHEXOL 300 MG/ML  SOLN
100.0000 mL | Freq: Once | INTRAMUSCULAR | Status: AC | PRN
  Administered 2020-11-07: 100 mL via INTRAVENOUS

## 2020-11-07 NOTE — Progress Notes (Signed)
HEMATOLOGY-ONCOLOGY PROGRESS NOTE  SUBJECTIVE: Angelica [name] is followed by our office for stage IIIb non-small cell lung cancer, adenocarcinoma.  She is currently receiving consolidation treatment with Imfinzi 1500 mg IV every 4 weeks.  Status post 3 cycles.  Last dose given on 11/02/2020.  During her last infusion, she developed fevers and chills and was sent to the emergency room for further evaluation.  Now admitted with sepsis secondary to Klebsiella pneumoniae bacteremia.  The patient was resting in bed quietly this morning.  She states that she feels much better than prior to admission.  She is not currently having any fevers or chills.  She denies chest pain, shortness of breath, cough.  Denies abdominal pain, nausea, vomiting.  No diarrhea.  She remains on IV antibiotics.  She was seen by infectious disease 6/19.  Repeat blood culture sent 6/19 are negative to date.  ID recommending CT abdomen/pelvis to rule out occult source given unclear cause of bacteremia.  Oncology History  Adenocarcinoma of left lung, stage 3 (HCC)  06/22/2020 Initial Diagnosis   Adenocarcinoma of left lung, stage 3 ([place])    06/22/2020 Cancer Staging   Staging form: Lung, AJCC 8th Edition - Clinical: Stage IIIB (cT2a, cN3, cM0) - Signed by Curt Bears, MD on 06/22/2020    07/04/2020 - 08/01/2020 Chemotherapy          09/06/2020 -  Chemotherapy    Patient is on Treatment Plan: LUNG NSCLC DURVALUMAB Q28D          REVIEW OF SYSTEMS:   Constitutional: Denies fevers, chills  Eyes: Denies blurriness of vision Ears, nose, mouth, throat, and face: Denies mucositis or sore throat Respiratory: Denies cough, dyspnea or wheezes Cardiovascular: Denies palpitation, chest discomfort Gastrointestinal:  Denies nausea, heartburn or change in bowel habits Skin: Denies abnormal skin rashes Lymphatics: Denies new lymphadenopathy or easy bruising Neurological:Denies numbness, tingling or new weaknesses Behavioral/Psych:  Mood is stable, no new changes  Extremities: No lower extremity edema All other systems were reviewed with the patient and are negative.  I have reviewed the past medical history, past surgical history, social history and family history with the patient and they are unchanged from previous note.   PHYSICAL EXAMINATION: ECOG PERFORMANCE STATUS: 1 - Symptomatic but completely ambulatory  Vitals:   11/07/20 0445 11/07/20 0834  BP: 134/79   Pulse: 69   Resp: 16   Temp: 98 F (36.7 C)   SpO2: 99% 97%   Filed Weights   11/02/20 1159  Weight: 66.9 kg    Intake/Output from previous day: 06/19 0701 - 06/20 0700 In: 480 [P.O.:480] Out: -   GENERAL:alert, no distress and comfortable SKIN: skin color, texture, turgor are normal, no rashes or significant lesions EYES: normal, Conjunctiva are pink and non-injected, sclera clear OROPHARYNX:no exudate, no erythema and lips, buccal mucosa, and tongue normal  LUNGS: clear to auscultation and percussion with normal breathing effort HEART: regular rate & rhythm and no murmurs and no lower extremity edema ABDOMEN:abdomen soft, non-tender and normal bowel sounds NEURO: alert & oriented x 3 with fluent speech, no focal motor/sensory deficits  Port-A-Cath without erythema  LABORATORY DATA:  I have reviewed the data as listed CMP Latest Ref Rng & Units 11/07/2020 11/06/2020 11/05/2020  Glucose 70 - 99 mg/dL 92 87 87  BUN 8 - 23 mg/dL 6(L) 5(L) 7(L)  Creatinine 0.44 - 1.00 mg/dL 0.65 0.65 0.60  Sodium 135 - 145 mmol/L 140 138 139  Potassium 3.5 - 5.1 mmol/L 3.2(L) 3.3(L) 3.2(L)  Chloride 98 - 111 mmol/L 110 110 108  CO2 22 - 32 mmol/L 24 25 25   Calcium 8.9 - 10.3 mg/dL 8.5(L) 8.4(L) 8.5(L)  Total Protein 6.5 - 8.1 g/dL 5.5(L) 5.6(L) 5.7(L)  Total Bilirubin 0.3 - 1.2 mg/dL 0.6 0.3 0.2(L)  Alkaline Phos 38 - 126 U/L 46 48 47  AST 15 - 41 U/L 24 17 14(L)  ALT 0 - 44 U/L 15 12 10     Lab Results  Component Value Date   WBC 5.6 11/07/2020    HGB 9.7 (L) 11/07/2020   HCT 29.0 (L) 11/07/2020   MCV 92.1 11/07/2020   PLT 197 11/07/2020   NEUTROABS 3.8 11/07/2020    CT ABDOMEN PELVIS WO CONTRAST  Result Date: 10/16/2020 CLINICAL DATA:  Mid abdominal pain and vomiting. History of non-small lung cancer. EXAM: CT ABDOMEN AND PELVIS WITHOUT CONTRAST TECHNIQUE: Multidetector CT imaging of the abdomen and pelvis was performed following the standard protocol without IV contrast. COMPARISON:  May 17, 2020 FINDINGS: Lower chest: Interval further decrease in the spiculated mass in the left lower lobe now measuring 1.4 x 1.6 cm. Atelectasis versus scarring in the left lower lobe. Hepatobiliary: Normal appearance of the liver. Tiny 2 mm gallstone in the dependent portion of the gallbladder. Pancreas: Atrophic pancreas. Spleen: Normal in size without focal abnormality. Adrenals/Urinary Tract: Adrenal glands are unremarkable. Kidneys are normal, without renal calculi, focal lesion, or hydronephrosis. Bladder is unremarkable. Stomach/Bowel: Stomach is within normal limits. No evidence of appendicitis. No evidence of bowel wall thickening, distention, or inflammatory changes. Vascular/Lymphatic: Aortic atherosclerosis. No enlarged abdominal or pelvic lymph nodes. Reproductive: Status post hysterectomy. No adnexal masses. Other: No abdominal wall hernia or abnormality. No abdominopelvic ascites. Musculoskeletal: Spondylosis of the lumbosacral spine. IMPRESSION: 1. No evidence of acute abnormalities within the abdomen or pelvis. 2. Interval further decrease in the spiculated mass in the left lower lobe upper lung now measuring 1.4 x 1.6 cm. 3. Cholelithiasis without evidence of acute cholecystitis. 4. Aortic atherosclerosis. Aortic Atherosclerosis (ICD10-I70.0). Electronically Signed   By: Fidela Salisbury M.D.   On: 10/16/2020 19:43   CT Head Wo Contrast  Result Date: 10/11/2020 CLINICAL DATA:  Dizziness, lung cancer EXAM: CT HEAD WITHOUT CONTRAST  TECHNIQUE: Contiguous axial images were obtained from the base of the skull through the vertex without intravenous contrast. COMPARISON:  None. FINDINGS: Brain: Normal anatomic configuration. Parenchymal volume loss is commensurate with the patient's age. Mild periventricular white matter changes are present likely reflecting the sequela of small vessel ischemia. No abnormal intra or extra-axial mass lesion or fluid collection. No abnormal mass effect or midline shift. No evidence of acute intracranial hemorrhage or infarct. Ventricular size is normal. Cerebellum unremarkable. Vascular: No asymmetric hyperdense vasculature at the skull base. Skull: Intact Sinuses/Orbits: There is moderate mucosal thickening within the right frontal sinus, several ethmoid air cells and to a lesser degree within the right maxillary sinus. No air-fluid levels. Orbits are unremarkable. Other: Mastoid air cells and middle ear cavities are clear. IMPRESSION: No acute intracranial abnormality.  Mild senescent change. Moderate paranasal sinus disease. Electronically Signed   By: Fidela Salisbury MD   On: 10/11/2020 21:33   CT CHEST W CONTRAST  Result Date: 11/05/2020 CLINICAL DATA:  Non-small cell lung cancer on immunotherapy. Patient with fever. EXAM: CT CHEST WITH CONTRAST TECHNIQUE: Multidetector CT imaging of the chest was performed during intravenous contrast administration. CONTRAST:  34mL OMNIPAQUE IOHEXOL 300 MG/ML  SOLN COMPARISON:  CT chest 08/29/2020. FINDINGS: Cardiovascular: Right anterior chest  wall Port-A-Cath is present with tip terminating in the superior vena cava. Normal heart size. Coronary arterial vascular calcifications. Trace fluid superior pericardial recess. Thoracic aortic vascular calcifications. There is a possible small filling defect within a distal subsegmental right lower lobe pulmonary artery (image 82; series 3). Mediastinum/Nodes: No enlarged axillary, mediastinal or hilar lymphadenopathy. Normal  appearance of the esophagus. Persistent enlarged heterogeneous thyroid. Lungs/Pleura: Central airways are patent. Continued interval decrease in size of left lower lobe nodule measuring 1.6 x 1.7 cm (image 98; series 8), previously 2.1 x 2.2 cm. Slight interval increase in patchy consolidative opacities within the more peripheral left lower lobe. Interval development of small bilateral pleural effusions. Minimal right basilar atelectasis. No pneumothorax. Stable 3 mm peripheral right middle lobe nodule (image 74; series 8). Upper Abdomen: No acute process. Musculoskeletal: Thoracic spine degenerative changes. No aggressive or acute appearing osseous lesions. IMPRESSION: 1. Suggestion of a small filling defect within a peripheral right lower lobe subsegmental pulmonary artery, potentially representing distal pulmonary embolus versus artifact. If clinically warranted, consider further evaluation with PE CT. 2. Interval decrease in size of left lower lobe nodule. 3. Interval development of patchy consolidative opacities within the left lower lobe which may represent atelectasis or infection. 4. Small bilateral pleural effusions. 5. Enlarged heterogeneous thyroid. Recommend thyroid ultrasound if and when clinically appropriate in the setting of metastatic malignancy. 6. Stable 3 mm nodule peripheral right middle lobe. Recommend continued attention on follow-up. 7. These results were called by telephone at the time of interpretation on 11/05/2020 at 8:59 am to provider A Cephus Slater , who verbally acknowledged these results. Electronically Signed   By: Lovey Newcomer M.D.   On: 11/05/2020 09:00   CT Angio Chest Pulmonary Embolism (PE) W or WO Contrast  Result Date: 11/05/2020 CLINICAL DATA:  Positive D-dimer. Non-small cell lung carcinoma undergoing immunotherapy. EXAM: CT ANGIOGRAPHY CHEST WITH CONTRAST TECHNIQUE: Multidetector CT imaging of the chest was performed using the standard protocol during bolus administration  of intravenous contrast. Multiplanar CT image reconstructions and MIPs were obtained to evaluate the vascular anatomy. CONTRAST:  65mL OMNIPAQUE IOHEXOL 350 MG/ML SOLN COMPARISON:  Routine chest CT on 11/04/2020 FINDINGS: Cardiovascular: Satisfactory opacification of pulmonary arteries noted, and no pulmonary emboli identified. No evidence of thoracic aortic dissection or aneurysm. Aortic and coronary atherosclerotic calcification noted. Mediastinum/Nodes: Stable diffuse goiter. No pathologically enlarged lymph nodes identified. Lungs/Pleura: Scarring/post treatment change is again seen in the left lower lobe. No discrete mass identified. No other suspicious pulmonary nodules or masses demonstrated. No evidence of pulmonary consolidation. Upper abdomen: No acute findings. Musculoskeletal: No suspicious bone lesions identified. Review of the MIP images confirms the above findings. IMPRESSION: No evidence of pulmonary embolism or other acute findings. Stable left lower lobe scarring/post treatment change. No evidence of recurrent or metastatic carcinoma. Stable diffuse goiter. Aortic Atherosclerosis (ICD10-I70.0). Electronically Signed   By: Marlaine Hind M.D.   On: 11/05/2020 11:16   DG Chest Port 1 View  Result Date: 11/02/2020 CLINICAL DATA:  Fever. EXAM: PORTABLE CHEST 1 VIEW COMPARISON:  October 19, 2020 FINDINGS: Port-A-Cath tip is in the superior vena cava. No pneumothorax. No edema or airspace opacity. Heart size and pulmonary vascularity normal. No adenopathy. There are surgical clips at the right cervical-thoracic junction. IMPRESSION: Port-A-Cath as described without pneumothorax. Lungs clear. Cardiac silhouette normal. Aortic Atherosclerosis (ICD10-I70.0). Electronically Signed   By: Lowella Grip III M.D.   On: 11/02/2020 13:28   DG Chest Portable 1 View  Result Date: 10/19/2020  CLINICAL DATA:  80 year old female with weakness. EXAM: PORTABLE CHEST 1 VIEW COMPARISON:  Chest radiograph dated  10/11/2020. FINDINGS: Right-sided Port-A-Cath tip over central SVC in similar position. Minimal left lung base atelectasis. No consolidative changes. There is no pleural effusion pneumothorax. The cardiac silhouette is within limits. No acute osseous pathology. Thyroidectomy surgical clips. IMPRESSION: No active cardiopulmonary disease. Electronically Signed   By: Anner Crete M.D.   On: 10/19/2020 16:01   DG Chest Port 1 View  Result Date: 10/11/2020 CLINICAL DATA:  80 year old female with weakness. EXAM: PORTABLE CHEST 1 VIEW COMPARISON:  Chest radiograph dated 06/10/2020 and CT dated 08/29/2020. FINDINGS: Right-sided Port-A-Cath with tip over central SVC. The lungs are clear. There is no pleural effusion pneumothorax. The cardiac silhouette is within limits. Atherosclerotic calcification of the aorta. No acute osseous pathology. IMPRESSION: No active cardiopulmonary disease. Electronically Signed   By: Anner Crete M.D.   On: 10/11/2020 20:29   DG Abd 2 Views  Result Date: 10/19/2020 CLINICAL DATA:  Abdominal pain and hypotension EXAM: ABDOMEN - 2 VIEW COMPARISON:  10/16/2020 CT FINDINGS: Scattered large and small bowel gas is noted. Some prominence of the small bowel in the left mid abdomen is noted although somewhat decreased when compared with the prior exam. No free air is seen. No mass lesion is noted. Degenerative changes of lumbar spine are seen. IMPRESSION: Mildly prominent small bowel the left mid abdomen but appears lesser than that seen on prior examination. Repeat CT may be helpful for further evaluation. Electronically Signed   By: Inez Catalina M.D.   On: 10/19/2020 22:50   VAS [place] LOWER EXTREMITY VENOUS (DVT)  Result Date: 11/05/2020  Lower Venous DVT Study Patient Name:  ANABELLE BUNGERT  Date of Exam:   11/05/2020 Medical Rec #: [id number]         Accession #:    [id number] Date of Birth: Apr 24, 1941          Patient Gender: F Patient Age:   79Y Exam Location:  [facility]  Procedure:      VAS [place] LOWER EXTREMITY VENOUS (DVT) Referring Phys: [id number] A CALDWELL POWELL JR --------------------------------------------------------------------------------  Indications: "swelling" Per MD order.  Comparison Study: Previous exam 08/22/16 (RLE) - negative Performing Technologist: Jody Hill RVT, RDMS  Examination Guidelines: A complete evaluation includes B-mode imaging, spectral Doppler, color Doppler, and power Doppler as needed of all accessible portions of each vessel. Bilateral testing is considered an integral part of a complete examination. Limited examinations for reoccurring indications may be performed as noted. The reflux portion of the exam is performed with the patient in reverse Trendelenburg.  +---------+---------------+---------+-----------+----------+--------------+ RIGHT    CompressibilityPhasicitySpontaneityPropertiesThrombus Aging +---------+---------------+---------+-----------+----------+--------------+ CFV      Full           Yes      Yes                                 +---------+---------------+---------+-----------+----------+--------------+ SFJ      Full                                                        +---------+---------------+---------+-----------+----------+--------------+ FV Prox  Full           Yes  Yes                                 +---------+---------------+---------+-----------+----------+--------------+ FV Mid   Full           Yes      Yes                                 +---------+---------------+---------+-----------+----------+--------------+ FV DistalFull           Yes      Yes                                 +---------+---------------+---------+-----------+----------+--------------+ PFV      Full                                                        +---------+---------------+---------+-----------+----------+--------------+ POP      Full           Yes      Yes                                  +---------+---------------+---------+-----------+----------+--------------+ PTV      Full                                                        +---------+---------------+---------+-----------+----------+--------------+ PERO     Full                                                        +---------+---------------+---------+-----------+----------+--------------+   +---------+---------------+---------+-----------+----------+--------------+ LEFT     CompressibilityPhasicitySpontaneityPropertiesThrombus Aging +---------+---------------+---------+-----------+----------+--------------+ CFV      Full           Yes      Yes                                 +---------+---------------+---------+-----------+----------+--------------+ SFJ      Full                                                        +---------+---------------+---------+-----------+----------+--------------+ FV Prox  Full           Yes      Yes                                 +---------+---------------+---------+-----------+----------+--------------+ FV Mid   Full           Yes      Yes                                 +---------+---------------+---------+-----------+----------+--------------+  FV DistalFull           Yes      Yes                                 +---------+---------------+---------+-----------+----------+--------------+ PFV      Full                                                        +---------+---------------+---------+-----------+----------+--------------+ POP      Full           Yes      Yes                                 +---------+---------------+---------+-----------+----------+--------------+ PTV      Full                                                        +---------+---------------+---------+-----------+----------+--------------+ PERO     Full                                                         +---------+---------------+---------+-----------+----------+--------------+     Summary: BILATERAL: - No evidence of deep vein thrombosis seen in the lower extremities, bilaterally. - No evidence of superficial venous thrombosis in the lower extremities, bilaterally. -No evidence of popliteal cyst, bilaterally.   *See table(s) above for measurements and observations. Electronically signed by Harold Barban MD on 11/05/2020 at 11:35:57 PM.    Final     ASSESSMENT AND PLAN: This is a very pleasant 80 year old African-American female with stage IIIb (T2a N3 M0) non-small cell lung cancer, adenocarcinoma who presented with a left lower lobe mass in addition to left hilar and mediastinal lymphadenopathy as well as right and left cervical lymphadenopathy diagnosed in January 2022.  She underwent a course of concurrent chemoradiation with weekly carboplatin and paclitaxel status post 5 cycles with a partial response.  She is now receiving consolidation treatment with Imfinzi 1500 mg IV every 4 weeks.  Now status post 3 cycles.    During the infusion with cycle #3, she developed fevers and chills and was transported to the emergency department for further evaluation.  Initially, there was concern for an infusion reaction.  However, the patient was found to have Klebsiella bacteremia.  Her presentation is less likely due to an infusion reaction and likely due to underlying sepsis secondary to bacteremia.  She is feeling much better at this time.  She is receiving IV antibiotics.  Repeat blood cultures drawn 6/19 are negative to date.  Infectious diseases following and recommend continuation of IV antibiotics.  They plan to obtain a CT of the abdomen/pelvis to look for occult infection.    The patient has outpatient follow-up scheduled with [place] already on 7/13.  She should keep this appointment.  We will not continue to actively follow during this hospitalization, but  please reach out for questions.    LOS: 4 days    Mikey Bussing, DNP, AGPCNP-BC, AOCNP 11/07/20

## 2020-11-07 NOTE — Care Management Important Message (Signed)
Important Message  Patient Details IM Letter placed in Patient's room. Name: Angelica [name] MRN: [id number] Date of Birth: 05/26/1940   Medicare Important Message Given:  Yes     Kerin Salen 11/07/2020, 11:10 AM

## 2020-11-07 NOTE — Progress Notes (Signed)
PROGRESS NOTE    Angelica [name]  LHT:[id number] DOB: [birth year] DOA: 11/02/2020 PCP: Angelica Borg, MD  Chief Complaint  Patient presents with   Fever   Brief Narrative:  Angelica [name] is Angelica [name] 80 y.o. 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 Earlie [name], medical oncology, essential hypertension, chronic abdominal pain who was getting her immunotherapy at oncology clinic today and developed fever 130.  No paraspinous was called and she was subsequently sent to the emergency department.  Per ED reports, she continued to have persistent fever, her Tmax has been 102.9 here.Patient herself does not have any other complaint such as chest pain, shortness of breath, nausea, vomiting, any problem with urination or bowel movements, cough, shortness of breath or any other complaint.   ED Course: Upon arrival to ED, her temperature was 102.9, she was slightly tachycardic.  Mild leukocytosis on CBC.  Normal CMP and normal lactic acid.  No pathology on the chest x-ray.  ED physician discussed the case with patient's primary oncologist Dr. Julien [name] who opined that patient likely had fever secondary to immunotherapy/chemotherapy however he recommended keeping in the hospital for observation and continued antibiotics and ruling out any underlying infection.  Patient received cefepime and vancomycin in the ED hospital service were consulted to admit the patient for further management.    Assessment & Plan:   Active Problems:   SIRS (systemic inflammatory response syndrome) (HCC)   Bacteremia due to Klebsiella pneumoniae   Fever   Malignant neoplasm of bronchus and lung (HCC)  Sepsis 2/2 Klebsiella Pneumoniae Bacteremia: Patient meets SIRS criteria based off of fever, tachycardia and leukocytosis.   Transitioned to ceftriaxone 6/16 - 6/18.  Narrow to ancef. Blood cultures with klebsiella, sensitive to ancef.  She has penicillin allergy (unclear  reaction). Urine culture multiple specieis (UA not c/w UTI) Unclear source at this time, repeat cultures with one off [name].  Appreciate ID assistance.  Concern for PE vs artifact Negative CT PE protocol for PE Negative LE [place]   Leukocytosis 2/2 steroids and infection  Essential hypertension: continue to hold BP meds   Non-small cell lung carcinoma: Follows with Dr. Julien [name] as outpatient.  S/p immunotherapy 6/15 (also had dose of steroids).  Further management per oncology. CT with contrast with interval decrease in size of LLL nodule, interval development of patchy consolidative opacities within the LLL which may represent atelecatsis or infection.  Small bilateral effusions.  Stable 3 mm nodule peripheral R middle lobe.  Enlarged heterogenous Thyroid Needs thyroid US outpatient  Asthma dulera Albuterol prn Singulair  Shoulder Pain  Add voltaren Tramadol prn  DVT prophylaxis: lovenox Code Status: full  Family Communication: daughter at bedside Disposition:   Status is: Observation  The patient will require care spanning > 2 midnights and should be moved to inpatient because: Inpatient level of care appropriate due to severity of illness  Dispo: The patient is from: Home              Anticipated d/c is to: Home              Patient currently is not medically stable to d/c.   Difficult to place patient No       Consultants:  none  Procedures:  none  Antimicrobials:  Anti-infectives (From admission, onward)    Start     Dose/Rate Route Frequency Ordered Stop   11/06/20 0600  ceFAZolin (ANCEF) IVPB 2g/100 mL premix  2 g 200 mL/hr over 30 Minutes Intravenous Every 8 hours 11/05/20 1321     11/03/20 1400  vancomycin (VANCOCIN) IVPB 1000 mg/200 mL premix  Status:  Discontinued        1,000 mg 200 mL/hr over 60 Minutes Intravenous Every 24 hours 11/02/20 1631 11/03/20 0831   11/03/20 0630  cefTRIAXone (ROCEPHIN) 2 g in sodium chloride 0.9 % 100 mL IVPB   Status:  Discontinued        2 g 200 mL/hr over 30 Minutes Intravenous Every 24 hours 11/03/20 0537 11/05/20 1320   11/02/20 2200  ceFEPIme (MAXIPIME) 2 g in sodium chloride 0.9 % 100 mL IVPB  Status:  Discontinued        2 g 200 mL/hr over 30 Minutes Intravenous Every 12 hours 11/02/20 1626 11/03/20 0537   11/02/20 1400  vancomycin (VANCOREADY) IVPB 1250 mg/250 mL        1,250 mg 166.7 mL/hr over 90 Minutes Intravenous  Once 11/02/20 1331 11/02/20 1658   11/02/20 1330  ceFEPIme (MAXIPIME) 2 g in sodium chloride 0.9 % 100 mL IVPB        2 g 200 mL/hr over 30 Minutes Intravenous  Once 11/02/20 1320 11/02/20 1530   11/02/20 1330  metroNIDAZOLE (FLAGYL) IVPB 500 mg        500 mg 100 mL/hr over 60 Minutes Intravenous  Once 11/02/20 1320 11/02/20 1530   11/02/20 1330  vancomycin (VANCOCIN) IVPB 1000 mg/200 mL premix  Status:  Discontinued        1,000 mg 200 mL/hr over 60 Minutes Intravenous  Once 11/02/20 1320 11/02/20 1329         Subjective: No new complaints  Objective: Vitals:   11/06/20 2047 11/07/20 0445 11/07/20 0834 11/07/20 1344  BP: 120/76 134/79  122/66  Pulse: 68 69  77  Resp: 16 16  16   Temp: 98.2 F (36.8 C) 98 F (36.7 C)  97.7 F (36.5 C)  TempSrc: Oral Oral  Oral  SpO2: 100% 99% 97% 99%  Weight:      Height:        Intake/Output Summary (Last 24 hours) at 11/07/2020 1727 Last data filed at 11/07/2020 1245 Gross per 24 hour  Intake 240 ml  Output --  Net 240 ml   Filed Weights   11/02/20 1159  Weight: 66.9 kg    Examination:  General: No acute distress. Cardiovascular: Heart sounds show Angelica [name] regular rate, and rhythm Lungs: Clear to auscultation bilaterally  Abdomen: Soft, nontender, nondistended  Neurological: Alert and oriented 3. Moves all extremities 4 . Cranial nerves II through XII grossly intact. Skin: Warm and dry. No rashes or lesions. Extremities: No clubbing or cyanosis. No edema.    Data Reviewed: I have personally reviewed  following labs and imaging studies  CBC: Recent Labs  Lab 11/02/20 1217 11/03/20 0428 11/04/20 0514 11/05/20 0745 11/06/20 0605 11/07/20 0503  WBC 13.0* 22.3* 12.6* 6.4 5.9 5.6  NEUTROABS 12.6*  --  10.5* 4.8 4.3 3.8  HGB 10.5* 10.2* 9.7* 10.4* 10.1* 9.7*  HCT 31.6* 30.7* 28.7* 31.5* 30.5* 29.0*  MCV 92.1 92.7 92.3 92.1 92.1 92.1  PLT 192 191 183 220 199 194    Basic Metabolic Panel: Recent Labs  Lab 11/02/20 1217 11/03/20 0428 11/04/20 0514 11/05/20 0745 11/06/20 0605 11/07/20 0503  NA 135 138 139 139 138 140  K 3.6 3.7 3.5 3.2* 3.3* 3.2*  CL 106 110 111 108 110 110  CO2 23 21* 21* 25  25 24  GLUCOSE 125* 143* 84 87 87 92  BUN 12 13 11  7* 5* 6*  CREATININE 0.76 0.85 0.77 0.60 0.65 0.65  CALCIUM 8.4* 8.7* 8.5* 8.5* 8.4* 8.5*  MG 1.6*  --  1.9 1.5* 1.6* 1.9  PHOS  --  3.5  --  2.8 3.3 3.6    GFR: Estimated Creatinine Clearance: 50.5 mL/min (by C-G formula based on SCr of 0.65 mg/dL).  Liver Function Tests: Recent Labs  Lab 11/02/20 1217 11/04/20 0514 11/05/20 0745 11/06/20 0605 11/07/20 0503  AST 19 13* 14* 17 24  ALT 11 11 10 12 15   ALKPHOS 59 49 47 48 46  BILITOT 0.4 0.2* 0.2* 0.3 0.6  PROT 6.4* 5.7* 5.7* 5.6* 5.5*  ALBUMIN 3.1* 2.5* 2.7* 2.7* 2.7*    CBG: No results for input(s): GLUCAP in the last 168 hours.   Recent Results (from the past 240 hour(s))  Blood Culture (routine x 2)     Status: Abnormal   Collection Time: 11/02/20 12:17 PM   Specimen: BLOOD  Result Value Ref Range Status   Specimen Description   Final    BLOOD RIGHT ANTECUBITAL Performed at [facility] [address] [place], [place] [address]    Special Requests   Final    BOTTLES DRAWN AEROBIC AND ANAEROBIC Blood Culture results may not be optimal due to an inadequate volume of blood received in culture bottles Performed at [facility] [address], [place], [place] [address]    Culture  Setup Time   Final    GRAM NEGATIVE  RODS IN BOTH AEROBIC AND ANAEROBIC BOTTLES CRITICAL VALUE NOTED.  VALUE IS CONSISTENT WITH PREVIOUSLY REPORTED AND CALLED VALUE.    Culture (Myliah Medel)  Final    KLEBSIELLA PNEUMONIAE SUSCEPTIBILITIES PERFORMED ON PREVIOUS CULTURE WITHIN THE LAST 5 DAYS. Performed at [facility], [place] [address], [place], [place] [address]    Report Status 11/05/2020 FINAL  Final  Blood Culture (routine x 2)     Status: Abnormal   Collection Time: 11/02/20 12:17 PM   Specimen: BLOOD  Result Value Ref Range Status   Specimen Description   Final    BLOOD LEFT ANTECUBITAL Performed at [facility] [address] [place], [place] [address]    Special Requests   Final    BOTTLES DRAWN AEROBIC AND ANAEROBIC Blood Culture results may not be optimal due to an inadequate volume of blood received in culture bottles Performed at [facility] [address] [place], [place] [address]    Culture  Setup Time   Final    GRAM NEGATIVE RODS IN BOTH AEROBIC AND ANAEROBIC BOTTLES CRITICAL RESULT CALLED TO, READ BACK BY AND VERIFIED WITH: Len Childs [id number] AT 530 AM Performed at [facility], [place] [address] [place], [place] [address]    Culture KLEBSIELLA PNEUMONIAE (Tasia Liz)  Final   Report Status 11/05/2020 FINAL  Final   Organism ID, Bacteria KLEBSIELLA PNEUMONIAE  Final      Susceptibility   Klebsiella pneumoniae - MIC*    AMPICILLIN >=32 RESISTANT Resistant     CEFAZOLIN <=4 SENSITIVE Sensitive     CEFEPIME <=0.12 SENSITIVE Sensitive     CEFTAZIDIME <=1 SENSITIVE Sensitive     CEFTRIAXONE <=0.25 SENSITIVE Sensitive     CIPROFLOXACIN <=0.25 SENSITIVE Sensitive     GENTAMICIN <=1 SENSITIVE Sensitive     IMIPENEM <=0.25 SENSITIVE Sensitive     TRIMETH/SULFA <=20  SENSITIVE Sensitive     AMPICILLIN/SULBACTAM 8 SENSITIVE Sensitive     PIP/TAZO <=4 SENSITIVE Sensitive     * KLEBSIELLA PNEUMONIAE  Blood Culture ID Panel (Reflexed)     Status: Abnormal   Collection  Time: 11/02/20 12:17 PM  Result Value Ref Range Status   Enterococcus faecalis NOT DETECTED NOT DETECTED Final   Enterococcus Faecium NOT DETECTED NOT DETECTED Final   Listeria monocytogenes NOT DETECTED NOT DETECTED Final   Staphylococcus species NOT DETECTED NOT DETECTED Final   Staphylococcus aureus (BCID) NOT DETECTED NOT DETECTED Final   Staphylococcus epidermidis NOT DETECTED NOT DETECTED Final   Staphylococcus lugdunensis NOT DETECTED NOT DETECTED Final   Streptococcus species NOT DETECTED NOT DETECTED Final   Streptococcus agalactiae NOT DETECTED NOT DETECTED Final   Streptococcus pneumoniae NOT DETECTED NOT DETECTED Final   Streptococcus pyogenes NOT DETECTED NOT DETECTED Final   Lavance Beazer.calcoaceticus-baumannii NOT DETECTED NOT DETECTED Final   Bacteroides fragilis NOT DETECTED NOT DETECTED Final   Enterobacterales DETECTED (Pebbles Zeiders) NOT DETECTED Final    Comment: Enterobacterales represent Terri Rorrer large order of gram negative bacteria, not Traveon Louro single organism. CRITICAL RESULT CALLED TO, READ BACK BY AND VERIFIED WITH: [place], AT [phone] 11/03/20 D. VANHOOK    Enterobacter cloacae complex NOT DETECTED NOT DETECTED Final   Escherichia coli NOT DETECTED NOT DETECTED Final   Klebsiella aerogenes NOT DETECTED NOT DETECTED Final   Klebsiella oxytoca NOT DETECTED NOT DETECTED Final   Klebsiella pneumoniae DETECTED (Exander Shaul) NOT DETECTED Final    Comment: CRITICAL RESULT CALLED TO, READ BACK BY AND VERIFIED WITH: Seleta Rhymes PHARMD, AT [phone] 11/03/20 D. VANHOOK    Proteus species NOT DETECTED NOT DETECTED Final   Salmonella species NOT DETECTED NOT DETECTED Final   Serratia marcescens NOT DETECTED NOT DETECTED Final   Haemophilus influenzae NOT DETECTED NOT DETECTED Final   Neisseria meningitidis NOT DETECTED NOT DETECTED Final   Pseudomonas aeruginosa NOT DETECTED NOT DETECTED Final   Stenotrophomonas maltophilia NOT DETECTED NOT DETECTED Final   Candida albicans NOT DETECTED NOT DETECTED Final    Candida auris NOT DETECTED NOT DETECTED Final   Candida glabrata NOT DETECTED NOT DETECTED Final   Candida krusei NOT DETECTED NOT DETECTED Final   Candida parapsilosis NOT DETECTED NOT DETECTED Final   Candida tropicalis NOT DETECTED NOT DETECTED Final   Cryptococcus neoformans/gattii NOT DETECTED NOT DETECTED Final   CTX-M ESBL NOT DETECTED NOT DETECTED Final   Carbapenem resistance IMP NOT DETECTED NOT DETECTED Final   Carbapenem resistance KPC NOT DETECTED NOT DETECTED Final   Carbapenem resistance NDM NOT DETECTED NOT DETECTED Final   Carbapenem resist OXA 48 LIKE NOT DETECTED NOT DETECTED Final   Carbapenem resistance VIM NOT DETECTED NOT DETECTED Final    Comment: Performed at [facility], [address] [place], [place] [address]  Resp Panel by RT-PCR (Flu Seanne Chirico&B, Covid) Nasopharyngeal Swab     Status: None   Collection Time: 11/02/20  1:10 PM   Specimen: Nasopharyngeal Swab; Nasopharyngeal(NP) swabs in vial transport medium  Result Value Ref Range Status   SARS Coronavirus 2 by RT PCR NEGATIVE NEGATIVE Final    Comment: (NOTE) SARS-CoV-2 target nucleic acids are NOT DETECTED.  The SARS-CoV-2 RNA is generally detectable in upper respiratory specimens during the acute phase of infection. The lowest concentration of SARS-CoV-2 viral copies this assay can detect is 138 copies/mL. Mahki Spikes negative result does not preclude SARS-Cov-2 infection and should not be used as the sole basis for treatment  or other patient management decisions. Alson Mcpheeters negative result may occur with  improper specimen collection/handling, submission of specimen other than nasopharyngeal swab, presence of viral mutation(s) within the areas targeted by this assay, and inadequate number of viral copies(<138 copies/mL). Amaryllis Malmquist negative result must be combined with clinical observations, patient history, and epidemiological information. The expected result is Negative.  Fact Sheet for Patients:   EntrepreneurPulse.com.au  Fact Sheet for Healthcare Providers:  IncredibleEmployment.be  This test is no t yet approved or cleared by the [place] FDA and  has been authorized for detection and/or diagnosis of SARS-CoV-2 by FDA under an Emergency Use Authorization (EUA). This EUA will remain  in effect (meaning this test can be used) for the duration of the COVID-19 declaration under Section 564(b)(1) of the Act, 21 U.S.C.section 360bbb-3(b)(1), unless the authorization is terminated  or revoked sooner.       Influenza Deann Mclaine by PCR NEGATIVE NEGATIVE Final   Influenza B by PCR NEGATIVE NEGATIVE Final    Comment: (NOTE) The Xpert Xpress SARS-CoV-2/FLU/RSV plus assay is intended as an aid in the diagnosis of influenza from Nasopharyngeal swab specimens and should not be used as Emmarae Cowdery sole basis for treatment. Nasal washings and aspirates are unacceptable for Xpert Xpress SARS-CoV-2/FLU/RSV testing.  Fact Sheet for Patients: EntrepreneurPulse.com.au  Fact Sheet for Healthcare Providers: IncredibleEmployment.be  This test is not yet approved or cleared by the [place] FDA and has been authorized for detection and/or diagnosis of SARS-CoV-2 by FDA under an Emergency Use Authorization (EUA). This EUA will remain in effect (meaning this test can be used) for the duration of the COVID-19 declaration under Section 564(b)(1) of the Act, 21 U.S.C. section 360bbb-3(b)(1), unless the authorization is terminated or revoked.  Performed at [facility], [place] [address], [place], [place] [address]   Urine culture     Status: Abnormal   Collection Time: 11/02/20  3:35 PM   Specimen: In/Out Cath Urine  Result Value Ref Range Status   Specimen Description   Final    IN/OUT CATH URINE Performed at [facility] [address], [place], [place] [address]    Special Requests   Final     NONE Performed at [facility], [place] [address], [place], [place] [address]    Culture MULTIPLE SPECIES PRESENT, SUGGEST RECOLLECTION (Kloi Brodman)  Final   Report Status 11/04/2020 FINAL  Final  Culture, blood (routine x 2)     Status: None (Preliminary result)   Collection Time: 11/06/20  1:38 PM   Specimen: BLOOD  Result Value Ref Range Status   Specimen Description   Final    BLOOD BLOOD RIGHT FOREARM Performed at [facility] [address], [place], [place] [address]    Special Requests   Final    BOTTLES DRAWN AEROBIC ONLY Blood Culture results may not be optimal due to an inadequate volume of blood received in culture bottles Performed at [facility] [address] [place], [place] [address]    Culture   Final    NO GROWTH < 24 HOURS Performed at [facility] [address] [place], [place] [address]    Report Status PENDING  Incomplete  Culture, blood (routine x 2)     Status: None (Preliminary result)   Collection Time: 11/06/20  1:45 PM   Specimen: BLOOD  Result Value Ref Range Status   Specimen Description   Final    BLOOD PORTA CATH Performed  at [facility], [place] [address], [place], [place] [address]    Special Requests   Final    BOTTLES DRAWN AEROBIC AND ANAEROBIC Blood Culture results may not be optimal due to an excessive volume of blood received in culture bottles Performed at [facility] [address] [place], [place] [address]    Culture   Final    NO GROWTH < 24 HOURS Performed at [facility] [address], [place],  [address]    Report Status PENDING  Incomplete         Radiology Studies: No results found.      Scheduled Meds:  Chlorhexidine Gluconate Cloth  6 each Topical Daily   cholecalciferol  2,000 Units Oral Daily   enoxaparin (LOVENOX) injection  40 mg Subcutaneous Daily   loratadine  10 mg Oral Daily    mometasone-formoterol  2 puff Inhalation BID   montelukast  10 mg Oral QHS   pravastatin  80 mg Oral Daily   sodium chloride flush  10-40 mL Intracatheter Q12H   Continuous Infusions:  sodium chloride 75 mL/hr at 11/07/20 0530    ceFAZolin (ANCEF) IV 2 g (11/07/20 1416)     LOS: 4 days    Time spent: over 30 min    Fayrene Helper, MD [facility]   To contact the attending provider between 7A-7P or the covering provider during after hours 7P-7A, please log into the web site [email or web address] and access using universal [place] password for that web site. If you do not have the password, please call the hospital operator.  11/07/2020, 5:27 PM

## 2020-11-08 DIAGNOSIS — R509 Fever, unspecified: Secondary | ICD-10-CM | POA: Diagnosis not present

## 2020-11-08 LAB — PHOSPHORUS: Phosphorus: 3.3 mg/dL (ref 2.5–4.6)

## 2020-11-08 LAB — CBC WITH DIFFERENTIAL/PLATELET
Abs Immature Granulocytes: 0.03 10*3/uL (ref 0.00–0.07)
Basophils Absolute: 0 10*3/uL (ref 0.0–0.1)
Basophils Relative: 0 %
Eosinophils Absolute: 0 10*3/uL (ref 0.0–0.5)
Eosinophils Relative: 0 %
HCT: 29.2 % — ABNORMAL LOW (ref 36.0–46.0)
Hemoglobin: 9.8 g/dL — ABNORMAL LOW (ref 12.0–15.0)
Immature Granulocytes: 1 %
Lymphocytes Relative: 16 %
Lymphs Abs: 0.9 10*3/uL (ref 0.7–4.0)
MCH: 31.1 pg (ref 26.0–34.0)
MCHC: 33.6 g/dL (ref 30.0–36.0)
MCV: 92.7 fL (ref 80.0–100.0)
Monocytes Absolute: 0.6 10*3/uL (ref 0.1–1.0)
Monocytes Relative: 11 %
Neutro Abs: 4.3 10*3/uL (ref 1.7–7.7)
Neutrophils Relative %: 72 %
Platelets: 229 10*3/uL (ref 150–400)
RBC: 3.15 MIL/uL — ABNORMAL LOW (ref 3.87–5.11)
RDW: 13.2 % (ref 11.5–15.5)
WBC: 6 10*3/uL (ref 4.0–10.5)
nRBC: 0 % (ref 0.0–0.2)

## 2020-11-08 LAB — COMPREHENSIVE METABOLIC PANEL
ALT: 14 U/L (ref 0–44)
AST: 23 U/L (ref 15–41)
Albumin: 2.7 g/dL — ABNORMAL LOW (ref 3.5–5.0)
Alkaline Phosphatase: 47 U/L (ref 38–126)
Anion gap: 5 (ref 5–15)
BUN: <5 mg/dL — ABNORMAL LOW (ref 8–23)
CO2: 25 mmol/L (ref 22–32)
Calcium: 8.7 mg/dL — ABNORMAL LOW (ref 8.9–10.3)
Chloride: 110 mmol/L (ref 98–111)
Creatinine, Ser: 0.61 mg/dL (ref 0.44–1.00)
GFR, Estimated: >60 mL/min (ref >60)
Glucose, Bld: 89 mg/dL (ref 70–99)
Potassium: 3.1 mmol/L — ABNORMAL LOW (ref 3.5–5.1)
Sodium: 140 mmol/L (ref 135–145)
Total Bilirubin: 0.3 mg/dL (ref 0.3–1.2)
Total Protein: 5.5 g/dL — ABNORMAL LOW (ref 6.5–8.1)

## 2020-11-08 LAB — MAGNESIUM: Magnesium: 1.7 mg/dL (ref 1.7–2.4)

## 2020-11-08 MED ORDER — CEPHALEXIN 500 MG PO CAPS: 500.0000 mg | ORAL_CAPSULE | Freq: Four times a day (QID) | ORAL | 0 refills | Status: AC

## 2020-11-08 MED ORDER — POTASSIUM CHLORIDE CRYS ER 20 MEQ PO TBCR
40.0000 meq | EXTENDED_RELEASE_TABLET | ORAL | Status: DC
  Administered 2020-11-08: 40 meq via ORAL
  Filled 2020-11-08: qty 2

## 2020-11-08 MED ORDER — HEPARIN SOD (PORK) LOCK FLUSH 100 UNIT/ML IV SOLN
500.0000 [IU] | INTRAVENOUS | Status: AC | PRN
  Administered 2020-11-08: 500 [IU]
  Filled 2020-11-08: qty 5

## 2020-11-08 NOTE — Consult Note (Signed)
[facility] Inpatient Consult   11/08/2020  Angelica [name] 1940-10-09 [id number]  [place] Organization [[facility]] Patient: [name]   Patient chart has been reviewed for readmissions less than 30 days and for high risk score for unplanned readmissions.  Patient assessed for community [facility] follow up needs.    Primary Care Provider: Cathlean Cower, MD does the [facility] follow up calls. PCP office has embedded chronic care management services. Per review, patient for discharge home.   Of note, [facility] Care Management services does not replace or interfere with any services that are arranged by inpatient case management or social work.   Netta Cedars, MSN, [facility] Liaison Nurse Mobile Phone [phone]  Toll free office [phone]

## 2020-11-08 NOTE — Progress Notes (Signed)
ID Brief Note  Chart reviewed, afebrile, no leukocytosis, hemodynamically stable  CT abdomen/pelvis unremarkable  Can change cefazolin to cephalexin PO for 8 more days when ready for discharge.   Fu with Oncology as instructed  Will sign off for now. Please call with questions   Rosiland Oz, MD Infectious Disease Physician [facility] [address] [place], [place] [address] Phone: [phone]  Fax: [phone]

## 2020-11-08 NOTE — Progress Notes (Signed)
Physical Therapy Treatment Patient Details Name: Angelica [name] MRN: [id number] DOB: 1940-08-23 Today's Date: 11/08/2020    History of Present Illness The pt is an 80 yo female admit due to fever and sepsis. 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    PT Comments    Patient progressing well with mobility and ambulated ~240' with no device and supervision for safety. VSS throughout. Educated pt on standing and seated functional LE strengthening and provided HEP handout. Pt requesting continuation of HHPT services. Encouraged pt to transition to exercise routine/[facility]. Acute PT will continue to progress during stay.     Follow Up Recommendations  Supervision for mobility/OOB;Home health PT     Equipment Recommendations  None recommended by PT    Recommendations for Other Services       Precautions / Restrictions Precautions Precautions: Fall Precaution Comments: low fall Restrictions Weight Bearing Restrictions: No    Mobility  Bed Mobility Overal bed mobility: Modified Independent             General bed mobility comments: pt with use of elevated HOB but able to mobilize without assist or use of bed rails.    Transfers Overall transfer level: Needs assistance Equipment used: None Transfers: Sit to/from Stand Sit to Stand: Supervision         General transfer comment: sup for safety, no cues or assist required  Ambulation/Gait Ambulation/Gait assistance: Supervision Gait Distance (Feet): 240 Feet Assistive device: None Gait Pattern/deviations: Step-through pattern;Decreased stride length Gait velocity: decr   General Gait Details: slow but stedy gait with no overt LOB. HR in 90-100's throughout.   Stairs             Wheelchair Mobility    Modified Rankin (Stroke Patients Only)       Balance Overall balance assessment: Mild deficits  observed, not formally tested                                          Cognition Arousal/Alertness: Awake/alert Behavior During Therapy: WFL for tasks assessed/performed Overall Cognitive Status: Within Functional Limits for tasks assessed                                 General Comments: Slight inconsistencies between pt reported history and daughter, but pt with good safety awareness, recall of past therapy interventions, and need for mobility at home.      Exercises General Exercises - Lower Extremity Hip ABduction/ADduction: AROM;Both;10 reps;Standing Hip Flexion/Marching: AROM;Both;10 reps;Standing Heel Raises: AROM;Both;10 reps;Standing Other Exercises Other Exercises: 10 reps Sit<>Stand, no UE use for power up.    General Comments        Pertinent Vitals/Pain Pain Assessment: No/denies pain    Home Living                      Prior Function            PT Goals (current goals can now be found in the care plan section) Acute Rehab PT Goals Patient Stated Goal: I hope to be able to go home soon PT Goal Formulation: With patient Time For Goal Achievement: 11/17/20 Potential to Achieve Goals: Good Progress towards PT goals: Progressing toward goals    Frequency  Min 3X/week      PT Plan Current plan remains appropriate    Co-evaluation              AM-PAC PT "6 Clicks" Mobility   Outcome Measure  Help needed turning from your back to your side while in a flat bed without using bedrails?: None Help needed moving from lying on your back to sitting on the side of a flat bed without using bedrails?: None Help needed moving to and from a bed to a chair (including a wheelchair)?: None Help needed standing up from a chair using your arms (e.g., wheelchair or bedside chair)?: None Help needed to walk in hospital room?: A Little Help needed climbing 3-5 steps with a railing? : A Little 6 Click Score: 22    End  of Session Equipment Utilized During Treatment: Gait belt Activity Tolerance: Patient tolerated treatment well Patient left: in chair;with call bell/phone within reach Nurse Communication: Mobility status PT Visit Diagnosis: Other abnormalities of gait and mobility (R26.89);Muscle weakness (generalized) (M62.81)     Time: 9826-4158 PT Time Calculation (min) (ACUTE ONLY): 23 min  Charges:  $Gait Training: 8-22 mins $Therapeutic Exercise: 8-22 mins                     Verner Mould, DPT Acute Rehabilitation Services Office [phone] Pager [phone]    Jacques Navy 11/08/2020, 12:09 PM

## 2020-11-08 NOTE — TOC Transition Note (Signed)
Transition of Care [facility]) - CM/SW Discharge Note   Patient Details  Name: Angelica [name] MRN: [id number] Date of Birth: Jan 24, 1941  Transition of Care [facility]) CM/SW Contact:  Lynnell Catalan, RN Phone Number: 11/08/2020, 12:29 PM   Clinical Narrative:    Pt was active with [facility] for home health RN/PT prior to admission. New orders written by MD for HHPT at dc. [facility] liaison contacted for resumption of care.       Readmission Risk Interventions No flowsheet data found.

## 2020-11-08 NOTE — Progress Notes (Signed)
AVS reviewed with patient and daughter at bedside. Pt voiced understanding of all new medications and follow up appointments.  All pt belongings placed in pts bag. Tele removed. Chest port deaccessed. Daughter will transport pt home via private vehicle.

## 2020-11-08 NOTE — Discharge Summary (Signed)
Physician Discharge Summary  Angelica [name] AOZ:[id number] DOB: September 15, 1940 DOA: 11/02/2020  PCP: Angelica Borg, MD  Admit date: 11/02/2020 Discharge date: 11/08/2020  Time spent: 40 minutes  Recommendations for Outpatient Follow-up:  Outpatient CBC/CMP Complete abx for bacteremia Review imaging with oncology outpatient Follow thyroid imaging findings outpatient Follow blood pressure outpatient - adjust as needed  Discharge Diagnoses:  Active Problems:   SIRS (systemic inflammatory response syndrome) (HCC)   Bacteremia due to Klebsiella pneumoniae   Fever   Malignant neoplasm of bronchus and lung ([place])   Discharge Condition: stable  Diet recommendation: heart healthy  Filed Weights   11/02/20 1159  Weight: 66.9 kg    History of present illness:  Angelica [name] is Angelica [name] 80 y.o. 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 Earlie [name], medical oncology, essential hypertension, chronic abdominal pain who was getting her immunotherapy at oncology clinic today and developed fever 130.  No paraspinous was called and she was subsequently sent to the emergency department.  Per ED reports, she continued to have persistent fever, her Tmax has been 102.9 here.Patient herself does not have any other complaint such as chest pain, shortness of breath, nausea, vomiting, any problem with urination or bowel movements, cough, shortness of breath or any other complaint.   ED Course: Upon arrival to ED, her temperature was 102.9, she was slightly tachycardic.  Mild leukocytosis on CBC.  Normal CMP and normal lactic acid.  No pathology on the chest x-ray.  ED physician discussed the case with patient's primary oncologist Dr. Julien [name] who opined that patient likely had fever secondary to immunotherapy/chemotherapy however he recommended keeping in the hospital for observation and continued antibiotics and ruling out any underlying infection.  Patient  received cefepime and vancomycin in the ED hospital service were consulted to admit the patient for further management.    She was admitted for sepsis 2/2 klebsiella pneumoniae bacteremia.  She's improved on antibiotics and is stable for discharge home 6/21.  See below for additional details  Hospital Course:  Sepsis 2/2 Klebsiella Pneumoniae Bacteremia: Patient meets SIRS criteria based off of fever, tachycardia and leukocytosis.   Transitioned to ceftriaxone 6/16 - 6/18.  [place] to ancef (6/18-6/21).  Discharge on keflex for additional 8 days. Blood cultures with klebsiella, sensitive to ancef.  She has penicillin allergy (unclear reaction). Urine culture multiple specieis (UA not c/w UTI) Repeat cultures from 6/19 no growth CT abd/pelvis without intraabdominal infection or abscess Appreciate ID assistance   Concern for PE vs artifact Negative CT PE protocol for PE Negative LE [place]   Leukocytosis 2/2 steroids and infection   Essential hypertension: continue to hold BP meds - hold micardis, resume amlodipine at discharge, follow with PCP   Non-small cell lung carcinoma: Follows with Dr. Julien [name] as outpatient.  S/p immunotherapy 6/15 (also had dose of steroids).  Further management per oncology. CT with contrast with interval decrease in size of LLL nodule, interval development of patchy consolidative opacities within the LLL which may represent atelecatsis or infection.  Small bilateral effusions.  Stable 3 mm nodule peripheral R middle lobe.   Enlarged heterogenous Thyroid Needs thyroid US outpatient Follow with Dr. Julien [name]   Asthma dulera Albuterol prn Singulair   Shoulder Pain Add voltaren Tramadol prn  Procedures: none  Consultations: ID  Discharge Exam: Vitals:   11/08/20 0453 11/08/20 0831  BP: 135/72   Pulse: 67   Resp: 17   Temp: 98.1 F (36.7  C)   SpO2: 99% 98%   No new complaints Feels well Discussed d/c plan with daughter Angelica [name] over phone,  granddaughter and daughter in room as well  General: No acute distress. Cardiovascular: Heart sounds show Angelica [name] regular rate, and rhythm.  Lungs: Clear to auscultation bilaterally  Abdomen: Soft, nontender, nondistended  Neurological: Alert and oriented 3. Moves all extremities 4 . Cranial nerves II through XII grossly intact. Skin: Warm and dry. No rashes or lesions. Extremities: No clubbing or cyanosis. No edema.  Discharge Instructions   Discharge Instructions     Call MD for:  difficulty breathing, headache or visual disturbances   Complete by: As directed    Call MD for:  difficulty breathing, headache or visual disturbances   Complete by: As directed    Call MD for:  extreme fatigue   Complete by: As directed    Call MD for:  extreme fatigue   Complete by: As directed    Call MD for:  hives   Complete by: As directed    Call MD for:  hives   Complete by: As directed    Call MD for:  persistant dizziness or light-headedness   Complete by: As directed    Call MD for:  persistant dizziness or light-headedness   Complete by: As directed    Call MD for:  persistant nausea and vomiting   Complete by: As directed    Call MD for:  persistant nausea and vomiting   Complete by: As directed    Call MD for:  redness, tenderness, or signs of infection (pain, swelling, redness, odor or green/yellow discharge around incision site)   Complete by: As directed    Call MD for:  redness, tenderness, or signs of infection (pain, swelling, redness, odor or green/yellow discharge around incision site)   Complete by: As directed    Call MD for:  severe uncontrolled pain   Complete by: As directed    Call MD for:  severe uncontrolled pain   Complete by: As directed    Call MD for:  temperature >100.4   Complete by: As directed    Call MD for:  temperature >100.4   Complete by: As directed    Diet - low sodium heart healthy   Complete by: As directed    Diet - low sodium heart healthy    Complete by: As directed    Discharge instructions   Complete by: As directed    You were seen for klebsiella bacteremia.  You've improved with IV antibiotics.  We'll discharge you home on keflex 500 mg 4 times daily when you go home for 8 more days.    We've held your amlodipine and micardis while you've been admitted to the hospital.  Continue to hold your micardis when you go home.  Resume your amlodipine and follow up with your PCP to determine if you should restart your micardis.   Your blood pressures have been appropriate here off of medicines.   Your thyroid was abnormal on imaging.  Please follow this up with Dr. Julien [name] outpatient.    Please follow up with oncology as an outpatient as scheduled.  Follow your imaging findings up with your oncologist outpatient.   Return for new, recurrent, or worsening symptoms.   Increase activity slowly   Complete by: As directed    Increase activity slowly   Complete by: As directed       Allergies as of 11/08/2020  Reactions   Asa Buff (mag [buffered Aspirin] Other (See Comments)   Excessive sweating   Ensure [nutritional Supplements]    Or boost - upset stomach    Fish Allergy Other (See Comments)   Unknown reaction, but allergic   Other Other (See Comments)   Gelcaps; gel-containing capsules; extended-release medication - upset stomach    Peanut-containing Drug Products Other (See Comments)   From allergy test   Penicillins Itching   Has patient had Linday Rhodes PCN reaction causing immediate rash, facial/tongue/throat swelling, SOB or lightheadedness with hypotension: No Has patient had Dennise Raabe PCN reaction causing severe rash involving mucus membranes or skin necrosis: No Has patient had Aarna Mihalko PCN reaction that required hospitalization No Has patient had Ed Rayson PCN reaction occurring within the last 10 years: No If all of the above answers are "NO", then may proceed with Cephalosporin use.   Shellfish-derived Products Other (See Comments)   From  allergy test   Strawberry Extract Other (See Comments)   From allergy test         Medication List     STOP taking these medications    telmisartan 80 MG tablet Commonly known as: MICARDIS       TAKE these medications    acetaminophen 325 MG tablet Commonly known as: TYLENOL Take 1 tablet (325 mg total) by mouth every 6 (six) hours as needed for headache or mild pain.   alum & mag hydroxide-simeth 200-200-20 MG/5ML suspension Commonly known as: MAALOX/MYLANTA Take 15 mLs by mouth every 6 (six) hours as needed for indigestion or heartburn.   amLODipine 10 MG tablet Commonly known as: NORVASC Take 10 mg by mouth daily.   budesonide-formoterol 80-4.5 MCG/ACT inhaler Commonly known as: Symbicort Inhale 2 puffs into the lungs 2 (two) times daily. Use for asthma flares 2 puffs twice Kwamaine Cuppett day for 2 weeks or until cough and wheeze free What changed:  when to take this reasons to take this additional instructions   cephALEXin 500 MG capsule Commonly known as: KEFLEX Take 1 capsule (500 mg total) by mouth 4 (four) times daily for 8 days.   EPINEPHrine 0.3 mg/0.3 mL Soaj injection Commonly known as: EpiPen 2-Pak Use as directed for severe allergic reaction   Fasenra Pen 30 MG/ML Soaj Generic drug: Benralizumab Inject into the skin every 8 (eight) weeks.   IMFINZI IV Inject 1,500 mg into the vein every 28 (twenty-eight) days. Every 4 weeks   ipratropium 0.06 % nasal spray Commonly known as: ATROVENT Place 2 sprays into both nostrils 3 (three) times daily. What changed:  when to take this reasons to take this   loratadine 10 MG tablet Commonly known as: CLARITIN Take 10 mg by mouth daily.   montelukast 10 MG tablet Commonly known as: SINGULAIR Take 1 tablet (10 mg total) by mouth at bedtime. What changed:  when to take this reasons to take this   ondansetron 8 MG tablet Commonly known as: ZOFRAN Take 1 tablet (8 mg total) by mouth every 8 (eight) hours as  needed for nausea or vomiting.   PHILLIPS MILK OF MAGNESIA PO Take 15-30 mLs by mouth daily as needed (for constipation).   polyethylene glycol 17 g packet Commonly known as: MIRALAX / GLYCOLAX Take 17 g by mouth daily as needed for mild constipation.   pravastatin 80 MG tablet Commonly known as: PRAVACHOL Take 1 tablet (80 mg total) by mouth daily.   traMADol 50 MG tablet Commonly known as: ULTRAM Take 1 tablet (50 mg total)  by mouth every 6 (six) hours as needed for moderate pain.   vitamin B-12 1000 MCG tablet Commonly known as: CYANOCOBALAMIN Take 1,000 mcg by mouth daily.   Vitamin D3 50 MCG (2000 UT) Tabs Take 2,000 Units by mouth daily.       ASK your doctor about these medications    linaclotide 72 MCG capsule Commonly known as: Linzess TAKE 1 CAPSULE BY MOUTH ONCE DAILY AS NEEDED   prochlorperazine 10 MG tablet Commonly known as: COMPAZINE Take 1 tablet (10 mg total) by mouth every 6 (six) hours as needed for nausea or vomiting.        Allergies  Allergen Reactions   Asa Buff (Mag [Buffered Aspirin] Other (See Comments)    Excessive sweating   Ensure [Nutritional Supplements]     Or boost - upset stomach    Fish Allergy Other (See Comments)    Unknown reaction, but allergic   Other Other (See Comments)    Gelcaps; gel-containing capsules; extended-release medication - upset stomach    Peanut-Containing Drug Products Other (See Comments)    From allergy test   Penicillins Itching    Has patient had Khalie Wince PCN reaction causing immediate rash, facial/tongue/throat swelling, SOB or lightheadedness with hypotension: No Has patient had Quantel Mcinturff PCN reaction causing severe rash involving mucus membranes or skin necrosis: No Has patient had Moni Rothrock PCN reaction that required hospitalization No Has patient had Amel Gianino PCN reaction occurring within the last 10 years: No If all of the above answers are "NO", then may proceed with Cephalosporin use.   Shellfish-Derived Products Other  (See Comments)    From allergy test   Strawberry Extract Other (See Comments)    From allergy test       The results of significant diagnostics from this hospitalization (including imaging, microbiology, ancillary and laboratory) are listed below for reference.    Significant Diagnostic Studies: CT ABDOMEN PELVIS WO CONTRAST  Result Date: 10/16/2020 CLINICAL DATA:  Mid abdominal pain and vomiting. History of non-small lung cancer. EXAM: CT ABDOMEN AND PELVIS WITHOUT CONTRAST TECHNIQUE: Multidetector CT imaging of the abdomen and pelvis was performed following the standard protocol without IV contrast. COMPARISON:  May 17, 2020 FINDINGS: Lower chest: Interval further decrease in the spiculated mass in the left lower lobe now measuring 1.4 x 1.6 cm. Atelectasis versus scarring in the left lower lobe. Hepatobiliary: Normal appearance of the liver. Tiny 2 mm gallstone in the dependent portion of the gallbladder. Pancreas: Atrophic pancreas. Spleen: Normal in size without focal abnormality. Adrenals/Urinary Tract: Adrenal glands are unremarkable. Kidneys are normal, without renal calculi, focal lesion, or hydronephrosis. Bladder is unremarkable. Stomach/Bowel: Stomach is within normal limits. No evidence of appendicitis. No evidence of bowel wall thickening, distention, or inflammatory changes. Vascular/Lymphatic: Aortic atherosclerosis. No enlarged abdominal or pelvic lymph nodes. Reproductive: Status post hysterectomy. No adnexal masses. Other: No abdominal wall hernia or abnormality. No abdominopelvic ascites. Musculoskeletal: Spondylosis of the lumbosacral spine. IMPRESSION: 1. No evidence of acute abnormalities within the abdomen or pelvis. 2. Interval further decrease in the spiculated mass in the left lower lobe upper lung now measuring 1.4 x 1.6 cm. 3. Cholelithiasis without evidence of acute cholecystitis. 4. Aortic atherosclerosis. Aortic Atherosclerosis (ICD10-I70.0). Electronically Signed    By: Fidela Salisbury M.D.   On: 10/16/2020 19:43   CT Head Wo Contrast  Result Date: 10/11/2020 CLINICAL DATA:  Dizziness, lung cancer EXAM: CT HEAD WITHOUT CONTRAST TECHNIQUE: Contiguous axial images were obtained from the base of the skull through  the vertex without intravenous contrast. COMPARISON:  None. FINDINGS: Brain: Normal anatomic configuration. Parenchymal volume loss is commensurate with the patient's age. Mild periventricular white matter changes are present likely reflecting the sequela of small vessel ischemia. No abnormal intra or extra-axial mass lesion or fluid collection. No abnormal mass effect or midline shift. No evidence of acute intracranial hemorrhage or infarct. Ventricular size is normal. Cerebellum unremarkable. Vascular: No asymmetric hyperdense vasculature at the skull base. Skull: Intact Sinuses/Orbits: There is moderate mucosal thickening within the right frontal sinus, several ethmoid air cells and to Vernona Peake lesser degree within the right maxillary sinus. No air-fluid levels. Orbits are unremarkable. Other: Mastoid air cells and middle ear cavities are clear. IMPRESSION: No acute intracranial abnormality.  Mild senescent change. Moderate paranasal sinus disease. Electronically Signed   By: Fidela Salisbury MD   On: 10/11/2020 21:33   CT CHEST W CONTRAST  Result Date: 11/05/2020 CLINICAL DATA:  Non-small cell lung cancer on immunotherapy. Patient with fever. EXAM: CT CHEST WITH CONTRAST TECHNIQUE: Multidetector CT imaging of the chest was performed during intravenous contrast administration. CONTRAST:  32mL OMNIPAQUE IOHEXOL 300 MG/ML  SOLN COMPARISON:  CT chest 08/29/2020. FINDINGS: Cardiovascular: Right anterior chest wall [place] Citron-Cath is present with tip terminating in the superior vena cava. Normal heart size. Coronary arterial vascular calcifications. Trace fluid superior pericardial recess. Thoracic aortic vascular calcifications. There is Yarelly Kuba possible small filling defect  within Nayellie Sanseverino distal subsegmental right lower lobe pulmonary artery (image 82; series 3). Mediastinum/Nodes: No enlarged axillary, mediastinal or hilar lymphadenopathy. Normal appearance of the esophagus. Persistent enlarged heterogeneous thyroid. Lungs/Pleura: Central airways are patent. Continued interval decrease in size of left lower lobe nodule measuring 1.6 x 1.7 cm (image 98; series 8), previously 2.1 x 2.2 cm. Slight interval increase in patchy consolidative opacities within the more peripheral left lower lobe. Interval development of small bilateral pleural effusions. Minimal right basilar atelectasis. No pneumothorax. Stable 3 mm peripheral right middle lobe nodule (image 74; series 8). Upper Abdomen: No acute process. Musculoskeletal: Thoracic spine degenerative changes. No aggressive or acute appearing osseous lesions. IMPRESSION: 1. Suggestion of Jettson Crable small filling defect within Neshawn Aird peripheral right lower lobe subsegmental pulmonary artery, potentially representing distal pulmonary embolus versus artifact. If clinically warranted, consider further evaluation with PE CT. 2. Interval decrease in size of left lower lobe nodule. 3. Interval development of patchy consolidative opacities within the left lower lobe which may represent atelectasis or infection. 4. Small bilateral pleural effusions. 5. Enlarged heterogeneous thyroid. Recommend thyroid ultrasound if and when clinically appropriate in the setting of metastatic malignancy. 6. Stable 3 mm nodule peripheral right middle lobe. Recommend continued attention on follow-up. 7. These results were called by telephone at the time of interpretation on 11/05/2020 at 8:59 am to provider Jatavia Keltner Cephus Slater , who verbally acknowledged these results. Electronically Signed   By: Lovey Newcomer M.D.   On: 11/05/2020 09:00   CT Angio Chest Pulmonary Embolism (PE) W or WO Contrast  Result Date: 11/05/2020 CLINICAL DATA:  Positive D-dimer. Non-small cell lung carcinoma undergoing  immunotherapy. EXAM: CT ANGIOGRAPHY CHEST WITH CONTRAST TECHNIQUE: Multidetector CT imaging of the chest was performed using the standard protocol during bolus administration of intravenous contrast. Multiplanar CT image reconstructions and MIPs were obtained to evaluate the vascular anatomy. CONTRAST:  87mL OMNIPAQUE IOHEXOL 350 MG/ML SOLN COMPARISON:  Routine chest CT on 11/04/2020 FINDINGS: Cardiovascular: Satisfactory opacification of pulmonary arteries noted, and no pulmonary emboli identified. No evidence of thoracic aortic dissection or aneurysm. Aortic  and coronary atherosclerotic calcification noted. Mediastinum/Nodes: Stable diffuse goiter. No pathologically enlarged lymph nodes identified. Lungs/Pleura: Scarring/post treatment change is again seen in the left lower lobe. No discrete mass identified. No other suspicious pulmonary nodules or masses demonstrated. No evidence of pulmonary consolidation. Upper abdomen: No acute findings. Musculoskeletal: No suspicious bone lesions identified. Review of the MIP images confirms the above findings. IMPRESSION: No evidence of pulmonary embolism or other acute findings. Stable left lower lobe scarring/post treatment change. No evidence of recurrent or metastatic carcinoma. Stable diffuse goiter. Aortic Atherosclerosis (ICD10-I70.0). Electronically Signed   By: Marlaine Hind M.D.   On: 11/05/2020 11:16   CT ABDOMEN PELVIS W CONTRAST  Result Date: 11/07/2020 CLINICAL DATA:  Abdominal abscess/infection suspected Air and chills, admitted with Klebsiella bacteremia. History of non-small cell lung cancer, active chemotherapy. EXAM: CT ABDOMEN AND PELVIS WITH CONTRAST TECHNIQUE: Multidetector CT imaging of the abdomen and pelvis was performed using the standard protocol following bolus administration of intravenous contrast. CONTRAST:  180mL OMNIPAQUE IOHEXOL 300 MG/ML  SOLN COMPARISON:  Most recent comparison CT 10/16/2020. Chest CTA 11/05/2020. FINDINGS: Lower chest:  Trace right and small left pleural effusion. Spiculated density in the left lower lobe is unchanged from recent chest CT. Hepatobiliary: No focal liver lesion or liver abnormality. Partially distended gallbladder without pericholecystic inflammation. Small gallstones versus wall calcification within Poet Hineman Phrygian cap. No biliary dilatation. Pancreas: Mild parenchymal atrophy. No ductal dilatation or inflammation. Spleen: Normal in size without focal abnormality. Adrenals/Urinary Tract: No adrenal nodule. Extrarenal pelvis configuration of both kidneys. No hydronephrosis, perinephric edema, or abnormal enhancement. Symmetric excretion on delayed phase imaging. No focal renal abnormality. Unremarkable urinary bladder. Stomach/Bowel: Partially distended stomach without gastric wall thickening. Minimal distal small bowel diverticulosis. No small bowel obstruction or inflammation. Administered enteric contrast reaches the distal ileal bowel loops. Appendix not confidently visualized. No appendicitis. Moderate volume of colonic stool. Mild colonic redundancy. There is no colonic wall thickening or inflammatory change. Vascular/Lymphatic: Aortic atherosclerosis. No aortic aneurysm. No acute aortic findings. Patent portal vein. No enlarged lymph nodes in the abdomen or pelvis. Reproductive: Hysterectomy.  No adnexal mass. Other: Minimal presacral soft tissue edema is nonspecific. No ascites or abscess. No free air. Postsurgical change of the anterior abdominal wall with small fat containing umbilical hernia in the upper abdomen. Musculoskeletal: There are no acute or suspicious osseous abnormalities. Multilevel degenerative change in the lumbar spine, prominent multilevel facet hypertrophy. Diffuse degenerative disc disease with vacuum phenomenon, disc space narrowing at L5-S1. IMPRESSION: 1. No acute abnormality in the abdomen/pelvis. No evidence of intra-abdominal infection or abscess. 2. Moderate colonic stool burden with  mild colonic redundancy, can be seen with constipation. No bowel obstruction or inflammation. 3. Small gallstones versus wall calcification within Shiquan Mathieu Phrygian cap. No pericholecystic inflammation. Aortic Atherosclerosis (ICD10-I70.0). Electronically Signed   By: Keith Rake M.D.   On: 11/07/2020 19:14   DG Chest Port 1 View  Result Date: 11/02/2020 CLINICAL DATA:  Fever. EXAM: PORTABLE CHEST 1 VIEW COMPARISON:  October 19, 2020 FINDINGS: Port-Analyse Angst-Cath tip is in the superior vena cava. No pneumothorax. No edema or airspace opacity. Heart size and pulmonary vascularity normal. No adenopathy. There are surgical clips at the right cervical-thoracic junction. IMPRESSION: Port-Clydie Dillen-Cath as described without pneumothorax. Lungs clear. Cardiac silhouette normal. Aortic Atherosclerosis (ICD10-I70.0). Electronically Signed   By: Lowella Grip III M.D.   On: 11/02/2020 13:28   DG Chest Portable 1 View  Result Date: 10/19/2020 CLINICAL DATA:  80 year old female with weakness. EXAM: PORTABLE CHEST  1 VIEW COMPARISON:  Chest radiograph dated 10/11/2020. FINDINGS: Right-sided [place] Prestage-Cath tip over central SVC in similar position. Minimal left lung base atelectasis. No consolidative changes. There is no pleural effusion pneumothorax. The cardiac silhouette is within limits. No acute osseous pathology. Thyroidectomy surgical clips. IMPRESSION: No active cardiopulmonary disease. Electronically Signed   By: Anner Crete M.D.   On: 10/19/2020 16:01   DG Chest Port 1 View  Result Date: 10/11/2020 CLINICAL DATA:  80 year old female with weakness. EXAM: PORTABLE CHEST 1 VIEW COMPARISON:  Chest radiograph dated 06/10/2020 and CT dated 08/29/2020. FINDINGS: Right-sided [place] Whedbee-Cath with tip over central SVC. The lungs are clear. There is no pleural effusion pneumothorax. The cardiac silhouette is within limits. Atherosclerotic calcification of the aorta. No acute osseous pathology. IMPRESSION: No active cardiopulmonary disease.  Electronically Signed   By: Anner Crete M.D.   On: 10/11/2020 20:29   DG Abd 2 Views  Result Date: 10/19/2020 CLINICAL DATA:  Abdominal pain and hypotension EXAM: ABDOMEN - 2 VIEW COMPARISON:  10/16/2020 CT FINDINGS: Scattered large and small bowel gas is noted. Some prominence of the small bowel in the left mid abdomen is noted although somewhat decreased when compared with the prior exam. No free air is seen. No mass lesion is noted. Degenerative changes of lumbar spine are seen. IMPRESSION: Mildly prominent small bowel the left mid abdomen but appears lesser than that seen on prior examination. Repeat CT may be helpful for further evaluation. Electronically Signed   By: Inez Catalina M.D.   On: 10/19/2020 22:50   VAS [place] LOWER EXTREMITY VENOUS (DVT)  Result Date: 11/05/2020  Lower Venous DVT Study Patient Name:  Angelica [name]  Date of Exam:   11/05/2020 Medical Rec #: [id number]         Accession #:    [id number] Date of Birth: Feb 26, 1941          Patient Gender: F Patient Age:   79Y Exam Location:  [facility] Procedure:      VAS [place] LOWER EXTREMITY VENOUS (DVT) Referring Phys: [id number] Naylea Wigington CALDWELL POWELL JR --------------------------------------------------------------------------------  Indications: "swelling" Per MD order.  Comparison Study: Previous exam 08/22/16 (RLE) - negative Performing Technologist: Jody Hill RVT, RDMS  Examination Guidelines: Mckena Chern complete evaluation includes B-mode imaging, spectral Doppler, color Doppler, and power Doppler as needed of all accessible portions of each vessel. Bilateral testing is considered an integral part of Sender Rueb complete examination. Limited examinations for reoccurring indications may be performed as noted. The reflux portion of the exam is performed with the patient in reverse Trendelenburg.  +---------+---------------+---------+-----------+----------+--------------+ RIGHT    CompressibilityPhasicitySpontaneityPropertiesThrombus Aging  +---------+---------------+---------+-----------+----------+--------------+ CFV      Full           Yes      Yes                                 +---------+---------------+---------+-----------+----------+--------------+ SFJ      Full                                                        +---------+---------------+---------+-----------+----------+--------------+ FV Prox  Full           Yes      Yes                                 +---------+---------------+---------+-----------+----------+--------------+  FV Mid   Full           Yes      Yes                                 +---------+---------------+---------+-----------+----------+--------------+ FV DistalFull           Yes      Yes                                 +---------+---------------+---------+-----------+----------+--------------+ PFV      Full                                                        +---------+---------------+---------+-----------+----------+--------------+ POP      Full           Yes      Yes                                 +---------+---------------+---------+-----------+----------+--------------+ PTV      Full                                                        +---------+---------------+---------+-----------+----------+--------------+ PERO     Full                                                        +---------+---------------+---------+-----------+----------+--------------+   +---------+---------------+---------+-----------+----------+--------------+ LEFT     CompressibilityPhasicitySpontaneityPropertiesThrombus Aging +---------+---------------+---------+-----------+----------+--------------+ CFV      Full           Yes      Yes                                 +---------+---------------+---------+-----------+----------+--------------+ SFJ      Full                                                         +---------+---------------+---------+-----------+----------+--------------+ FV Prox  Full           Yes      Yes                                 +---------+---------------+---------+-----------+----------+--------------+ FV Mid   Full           Yes      Yes                                 +---------+---------------+---------+-----------+----------+--------------+ FV DistalFull  Yes      Yes                                 +---------+---------------+---------+-----------+----------+--------------+ PFV      Full                                                        +---------+---------------+---------+-----------+----------+--------------+ POP      Full           Yes      Yes                                 +---------+---------------+---------+-----------+----------+--------------+ PTV      Full                                                        +---------+---------------+---------+-----------+----------+--------------+ PERO     Full                                                        +---------+---------------+---------+-----------+----------+--------------+     Summary: BILATERAL: - No evidence of deep vein thrombosis seen in the lower extremities, bilaterally. - No evidence of superficial venous thrombosis in the lower extremities, bilaterally. -No evidence of popliteal cyst, bilaterally.   *See table(s) above for measurements and observations. Electronically signed by Harold Barban MD on 11/05/2020 at 11:35:57 PM.    Final     Microbiology: Recent Results (from the past 240 hour(s))  Blood Culture (routine x 2)     Status: Abnormal   Collection Time: 11/02/20 12:17 PM   Specimen: BLOOD  Result Value Ref Range Status   Specimen Description   Final    BLOOD RIGHT ANTECUBITAL Performed at [facility], [place] [address], [place], [place] [address]    Special Requests   Final    BOTTLES DRAWN AEROBIC AND ANAEROBIC Blood  Culture results may not be optimal due to an inadequate volume of blood received in culture bottles Performed at [facility] [address] [place], [place] [address]    Culture  Setup Time   Final    GRAM NEGATIVE RODS IN BOTH AEROBIC AND ANAEROBIC BOTTLES CRITICAL VALUE NOTED.  VALUE IS CONSISTENT WITH PREVIOUSLY REPORTED AND CALLED VALUE.    Culture (Tuwanda Vokes)  Final    KLEBSIELLA PNEUMONIAE SUSCEPTIBILITIES PERFORMED ON PREVIOUS CULTURE WITHIN THE LAST 5 DAYS. Performed at [facility], [place] [address], [place], [place] [address]    Report Status 11/05/2020 FINAL  Final  Blood Culture (routine x 2)     Status: Abnormal   Collection Time: 11/02/20 12:17 PM   Specimen: BLOOD  Result Value Ref Range Status   Specimen Description   Final    BLOOD LEFT ANTECUBITAL Performed at [place] [address] [place], [place] [address]    Special Requests  Final    BOTTLES DRAWN AEROBIC AND ANAEROBIC Blood Culture results may not be optimal due to an inadequate volume of blood received in culture bottles Performed at [facility], [place] [address], [place], [place] [address]    Culture  Setup Time   Final    GRAM NEGATIVE RODS IN BOTH AEROBIC AND ANAEROBIC BOTTLES CRITICAL RESULT CALLED TO, READ BACK BY AND VERIFIED WITH: Len Childs [phone] AT 530 AM Performed at [facility], [place] [address], [place], [place] [address]    Culture KLEBSIELLA PNEUMONIAE ([place])  Final   Report Status 11/05/2020 FINAL  Final   Organism ID, Bacteria KLEBSIELLA PNEUMONIAE  Final      Susceptibility   Klebsiella pneumoniae - MIC*    AMPICILLIN >=32 RESISTANT Resistant     CEFAZOLIN <=4 SENSITIVE Sensitive     CEFEPIME <=0.12 SENSITIVE Sensitive     CEFTAZIDIME <=1 SENSITIVE Sensitive     CEFTRIAXONE <=0.25 SENSITIVE Sensitive     CIPROFLOXACIN <=0.25 SENSITIVE Sensitive     GENTAMICIN <=1 SENSITIVE Sensitive     IMIPENEM <=0.25  SENSITIVE Sensitive     TRIMETH/SULFA <=20 SENSITIVE Sensitive     AMPICILLIN/SULBACTAM 8 SENSITIVE Sensitive     PIP/TAZO <=4 SENSITIVE Sensitive     * KLEBSIELLA PNEUMONIAE  Blood Culture ID Panel (Reflexed)     Status: Abnormal   Collection Time: 11/02/20 12:17 PM  Result Value Ref Range Status   Enterococcus faecalis NOT DETECTED NOT DETECTED Final   Enterococcus Faecium NOT DETECTED NOT DETECTED Final   Listeria monocytogenes NOT DETECTED NOT DETECTED Final   Staphylococcus species NOT DETECTED NOT DETECTED Final   Staphylococcus aureus (BCID) NOT DETECTED NOT DETECTED Final   Staphylococcus epidermidis NOT DETECTED NOT DETECTED Final   Staphylococcus lugdunensis NOT DETECTED NOT DETECTED Final   Streptococcus species NOT DETECTED NOT DETECTED Final   Streptococcus agalactiae NOT DETECTED NOT DETECTED Final   Streptococcus pneumoniae NOT DETECTED NOT DETECTED Final   Streptococcus pyogenes NOT DETECTED NOT DETECTED Final   Ziomara Birenbaum.calcoaceticus-baumannii NOT DETECTED NOT DETECTED Final   Bacteroides fragilis NOT DETECTED NOT DETECTED Final   Enterobacterales DETECTED (Sylvanna Burggraf) NOT DETECTED Final    Comment: Enterobacterales represent Zamarion Longest large order of gram negative bacteria, not Braylen Denunzio single organism. CRITICAL RESULT CALLED TO, READ BACK BY AND VERIFIED WITH: [place], AT [phone] 11/03/20 D. VANHOOK    Enterobacter cloacae complex NOT DETECTED NOT DETECTED Final   Escherichia coli NOT DETECTED NOT DETECTED Final   Klebsiella aerogenes NOT DETECTED NOT DETECTED Final   Klebsiella oxytoca NOT DETECTED NOT DETECTED Final   Klebsiella pneumoniae DETECTED (Jencarlo Bonadonna) NOT DETECTED Final    Comment: CRITICAL RESULT CALLED TO, READ BACK BY AND VERIFIED WITH: Seleta Rhymes PHARMD, AT [phone] 11/03/20 D. VANHOOK    Proteus species NOT DETECTED NOT DETECTED Final   Salmonella species NOT DETECTED NOT DETECTED Final   Serratia marcescens NOT DETECTED NOT DETECTED Final   Haemophilus influenzae NOT DETECTED NOT  DETECTED Final   Neisseria meningitidis NOT DETECTED NOT DETECTED Final   Pseudomonas aeruginosa NOT DETECTED NOT DETECTED Final   Stenotrophomonas maltophilia NOT DETECTED NOT DETECTED Final   Candida albicans NOT DETECTED NOT DETECTED Final   Candida auris NOT DETECTED NOT DETECTED Final   Candida glabrata NOT DETECTED NOT DETECTED Final   Candida krusei NOT DETECTED NOT DETECTED Final   Candida parapsilosis NOT DETECTED NOT DETECTED Final   Candida tropicalis NOT DETECTED NOT DETECTED Final  Cryptococcus neoformans/gattii NOT DETECTED NOT DETECTED Final   CTX-M ESBL NOT DETECTED NOT DETECTED Final   Carbapenem resistance IMP NOT DETECTED NOT DETECTED Final   Carbapenem resistance KPC NOT DETECTED NOT DETECTED Final   Carbapenem resistance NDM NOT DETECTED NOT DETECTED Final   Carbapenem resist OXA 48 LIKE NOT DETECTED NOT DETECTED Final   Carbapenem resistance VIM NOT DETECTED NOT DETECTED Final    Comment: Performed at [facility], [place] [address], [place], [place] [address]  Resp Panel by RT-PCR (Flu Oline Belk&B, Covid) Nasopharyngeal Swab     Status: None   Collection Time: 11/02/20  1:10 PM   Specimen: Nasopharyngeal Swab; Nasopharyngeal(NP) swabs in vial transport medium  Result Value Ref Range Status   SARS Coronavirus 2 by RT PCR NEGATIVE NEGATIVE Final    Comment: (NOTE) SARS-CoV-2 target nucleic acids are NOT DETECTED.  The SARS-CoV-2 RNA is generally detectable in upper respiratory specimens during the acute phase of infection. The lowest concentration of SARS-CoV-2 viral copies this assay can detect is 138 copies/mL. Armetta Henri negative result does not preclude SARS-Cov-2 infection and should not be used as the sole basis for treatment or other patient management decisions. Shadiamond Koska negative result may occur with  improper specimen collection/handling, submission of specimen other than nasopharyngeal swab, presence of viral mutation(s) within the areas targeted by this assay, and  inadequate number of viral copies(<138 copies/mL). Ndia Sampath negative result must be combined with clinical observations, patient history, and epidemiological information. The expected result is Negative.  Fact Sheet for Patients:  EntrepreneurPulse.com.au  Fact Sheet for Healthcare Providers:  IncredibleEmployment.be  This test is no t yet approved or cleared by the [place] FDA and  has been authorized for detection and/or diagnosis of SARS-CoV-2 by FDA under an Emergency Use Authorization (EUA). This EUA will remain  in effect (meaning this test can be used) for the duration of the COVID-19 declaration under Section 564(b)(1) of the Act, 21 U.S.C.section 360bbb-3(b)(1), unless the authorization is terminated  or revoked sooner.       Influenza Boomer Winders by PCR NEGATIVE NEGATIVE Final   Influenza B by PCR NEGATIVE NEGATIVE Final    Comment: (NOTE) The Xpert Xpress SARS-CoV-2/FLU/RSV plus assay is intended as an aid in the diagnosis of influenza from Nasopharyngeal swab specimens and should not be used as Baillie Mohammad sole basis for treatment. Nasal washings and aspirates are unacceptable for Xpert Xpress SARS-CoV-2/FLU/RSV testing.  Fact Sheet for Patients: EntrepreneurPulse.com.au  Fact Sheet for Healthcare Providers: IncredibleEmployment.be  This test is not yet approved or cleared by the [place] FDA and has been authorized for detection and/or diagnosis of SARS-CoV-2 by FDA under an Emergency Use Authorization (EUA). This EUA will remain in effect (meaning this test can be used) for the duration of the COVID-19 declaration under Section 564(b)(1) of the Act, 21 U.S.C. section 360bbb-3(b)(1), unless the authorization is terminated or revoked.  Performed at [facility], [place] [address], [place], [place] [address]   Urine culture     Status: Abnormal   Collection Time: 11/02/20  3:35 PM    Specimen: In/Out Cath Urine  Result Value Ref Range Status   Specimen Description   Final    IN/OUT CATH URINE Performed at [facility] [address], [place], [place] [address]    Special Requests   Final    NONE Performed at [facility], [place] [address], [place], [place] [address]    Culture MULTIPLE SPECIES PRESENT, SUGGEST  RECOLLECTION (Abisai Coble)  Final   Report Status 11/04/2020 FINAL  Final  Culture, blood (routine x 2)     Status: None (Preliminary result)   Collection Time: 11/06/20  1:38 PM   Specimen: BLOOD  Result Value Ref Range Status   Specimen Description   Final    BLOOD BLOOD RIGHT FOREARM Performed at [facility] [address], [place], [place] [address]    Special Requests   Final    BOTTLES DRAWN AEROBIC ONLY Blood Culture results may not be optimal due to an inadequate volume of blood received in culture bottles Performed at [place] [address] [place], [place] [address]    Culture   Final    NO GROWTH 2 DAYS Performed at [facility] [address], [place], [place] [address]    Report Status PENDING  Incomplete  Culture, blood (routine x 2)     Status: None (Preliminary result)   Collection Time: 11/06/20  1:45 PM   Specimen: BLOOD  Result Value Ref Range Status   Specimen Description   Final    BLOOD PORTA CATH Performed at [facility] [address] [place], [place] [address]    Special Requests   Final    BOTTLES DRAWN AEROBIC AND ANAEROBIC Blood Culture results may not be optimal due to an excessive volume of blood received in culture bottles Performed at [facility] [address] [place], [place] [address]    Culture   Final    NO GROWTH 2 DAYS Performed at [facility] [address] [place], [place] [address]    Report Status PENDING  Incomplete     Labs: Basic Metabolic Panel: Recent Labs  Lab  11/03/20 0428 11/04/20 0514 11/05/20 0745 11/06/20 0605 11/07/20 0503 11/08/20 0538  NA 138 139 139 138 140 140  K 3.7 3.5 3.2* 3.3* 3.2* 3.1*  CL 110 111 108 110 110 110  CO2 21* 21* 25 25 24 25   GLUCOSE 143* 84 87 87 92 89  BUN 13 11 7* 5* 6* <5*  CREATININE 0.85 0.77 0.60 0.65 0.65 0.61  CALCIUM 8.7* 8.5* 8.5* 8.4* 8.5* 8.7*  MG  --  1.9 1.5* 1.6* 1.9 1.7  PHOS 3.5  --  2.8 3.3 3.6 3.3   Liver Function Tests: Recent Labs  Lab 11/04/20 0514 11/05/20 0745 11/06/20 0605 11/07/20 0503 11/08/20 0538  AST 13* 14* 17 24 23   ALT 11 10 12 15 14   ALKPHOS 49 47 48 46 47  BILITOT 0.2* 0.2* 0.3 0.6 0.3  PROT 5.7* 5.7* 5.6* 5.5* 5.5*  ALBUMIN 2.5* 2.7* 2.7* 2.7* 2.7*   No results for input(s): LIPASE, AMYLASE in the last 168 hours. No results for input(s): AMMONIA in the last 168 hours. CBC: Recent Labs  Lab 11/04/20 0514 11/05/20 0745 11/06/20 0605 11/07/20 0503 11/08/20 0538  WBC 12.6* 6.4 5.9 5.6 6.0  NEUTROABS 10.5* 4.8 4.3 3.8 4.3  HGB 9.7* 10.4* 10.1* 9.7* 9.8*  HCT 28.7* 31.5* 30.5* 29.0* 29.2*  MCV 92.3 92.1 92.1 92.1 92.7  PLT 183 220 199 197 229   Cardiac Enzymes: No results for input(s): CKTOTAL, CKMB, CKMBINDEX, TROPONINI in the last 168 hours. BNP: BNP (last 3 results) No results for input(s): BNP in the last 8760 hours.  ProBNP (last 3 results) No results for input(s): PROBNP in the last 8760 hours.  CBG: No results for input(s): GLUCAP in the last 168  hours.     Signed:  Fayrene Helper MD.  [facility] 11/08/2020, 11:26 AM

## 2020-11-10 DIAGNOSIS — M8580 Other specified disorders of bone density and structure, unspecified site: Secondary | ICD-10-CM | POA: Diagnosis not present

## 2020-11-10 DIAGNOSIS — M7592 Shoulder lesion, unspecified, left shoulder: Secondary | ICD-10-CM | POA: Diagnosis not present

## 2020-11-10 DIAGNOSIS — J4550 Severe persistent asthma, uncomplicated: Secondary | ICD-10-CM | POA: Diagnosis not present

## 2020-11-10 DIAGNOSIS — M19012 Primary osteoarthritis, left shoulder: Secondary | ICD-10-CM | POA: Diagnosis not present

## 2020-11-10 DIAGNOSIS — M47816 Spondylosis without myelopathy or radiculopathy, lumbar region: Secondary | ICD-10-CM | POA: Diagnosis not present

## 2020-11-10 DIAGNOSIS — M7552 Bursitis of left shoulder: Secondary | ICD-10-CM | POA: Diagnosis not present

## 2020-11-10 DIAGNOSIS — M170 Bilateral primary osteoarthritis of knee: Secondary | ICD-10-CM | POA: Diagnosis not present

## 2020-11-10 DIAGNOSIS — I10 Essential (primary) hypertension: Secondary | ICD-10-CM | POA: Diagnosis not present

## 2020-11-10 DIAGNOSIS — C3492 Malignant neoplasm of unspecified part of left bronchus or lung: Secondary | ICD-10-CM | POA: Diagnosis not present

## 2020-11-11 ENCOUNTER — Ambulatory Visit: Payer: Medicare HMO | Admitting: Family Medicine

## 2020-11-11 ENCOUNTER — Telehealth: Payer: Self-pay | Admitting: Internal Medicine

## 2020-11-11 DIAGNOSIS — M7552 Bursitis of left shoulder: Secondary | ICD-10-CM | POA: Diagnosis not present

## 2020-11-11 DIAGNOSIS — I10 Essential (primary) hypertension: Secondary | ICD-10-CM | POA: Diagnosis not present

## 2020-11-11 DIAGNOSIS — M19012 Primary osteoarthritis, left shoulder: Secondary | ICD-10-CM | POA: Diagnosis not present

## 2020-11-11 DIAGNOSIS — M8580 Other specified disorders of bone density and structure, unspecified site: Secondary | ICD-10-CM | POA: Diagnosis not present

## 2020-11-11 DIAGNOSIS — C3492 Malignant neoplasm of unspecified part of left bronchus or lung: Secondary | ICD-10-CM | POA: Diagnosis not present

## 2020-11-11 DIAGNOSIS — M170 Bilateral primary osteoarthritis of knee: Secondary | ICD-10-CM | POA: Diagnosis not present

## 2020-11-11 DIAGNOSIS — M7592 Shoulder lesion, unspecified, left shoulder: Secondary | ICD-10-CM | POA: Diagnosis not present

## 2020-11-11 DIAGNOSIS — M47816 Spondylosis without myelopathy or radiculopathy, lumbar region: Secondary | ICD-10-CM | POA: Diagnosis not present

## 2020-11-11 DIAGNOSIS — J4550 Severe persistent asthma, uncomplicated: Secondary | ICD-10-CM | POA: Diagnosis not present

## 2020-11-11 LAB — CULTURE, BLOOD (ROUTINE X 2)
Culture: NO GROWTH
Culture: NO GROWTH

## 2020-11-11 NOTE — Telephone Encounter (Signed)
   Angelica [name] called and said that the PT eval was performed today and no future visits are needed. He said that the patients BP was 150/86 prior to exertion. He said that the patient took her medicine earlier this morning. Patient has an appointment 6/27 with PCP.   Phone: [phone]

## 2020-11-14 ENCOUNTER — Ambulatory Visit (INDEPENDENT_AMBULATORY_CARE_PROVIDER_SITE_OTHER): Payer: Medicare HMO | Admitting: Internal Medicine

## 2020-11-14 ENCOUNTER — Other Ambulatory Visit: Payer: Self-pay

## 2020-11-14 ENCOUNTER — Telehealth: Payer: Self-pay | Admitting: Internal Medicine

## 2020-11-14 ENCOUNTER — Encounter: Payer: Self-pay | Admitting: Internal Medicine

## 2020-11-14 VITALS — BP 138/76 | HR 79 | Temp 98.4°F | Ht 65.0 in | Wt 149.6 lb

## 2020-11-14 DIAGNOSIS — M7989 Other specified soft tissue disorders: Secondary | ICD-10-CM

## 2020-11-14 DIAGNOSIS — R739 Hyperglycemia, unspecified: Secondary | ICD-10-CM

## 2020-11-14 DIAGNOSIS — I10 Essential (primary) hypertension: Secondary | ICD-10-CM

## 2020-11-14 DIAGNOSIS — B961 Klebsiella pneumoniae [K. pneumoniae] as the cause of diseases classified elsewhere: Secondary | ICD-10-CM | POA: Diagnosis not present

## 2020-11-14 DIAGNOSIS — K5900 Constipation, unspecified: Secondary | ICD-10-CM | POA: Diagnosis not present

## 2020-11-14 DIAGNOSIS — R7881 Bacteremia: Secondary | ICD-10-CM

## 2020-11-14 NOTE — Progress Notes (Signed)
Patient ID: MARITES NATH, female   DOB: 1940/08/21, 80 y.o.   MRN: [id number]        Chief Complaint: follow up recent hospn for klebsiella bactermia       HPI:  TREVIA NOP is a 80 y.o. female here with above to finish oral antibx in a few more days, overall doing quite well without fever, chills, HA, ST cough, cp, sob, abd pain, dysuria and diarrhea.  BP was low while hospd, restarted on amldoipine at d/c, but remains off telmisartan for now.  Has some trace LLE swelling on the amloidpine.  Denies worsening reflux, abd pain, dysphagia, n/v, bowel change or blood and constipation seems improved.         Wt Readings from Last 3 Encounters:  11/14/20 149 lb 9.6 oz (67.9 kg)  11/02/20 147 lb 7.8 oz (66.9 kg)  11/02/20 147 lb 6.4 oz (66.9 kg)   BP Readings from Last 3 Encounters:  11/14/20 138/76  11/08/20 135/72  11/02/20 (!) 101/50         Past Medical History:  Diagnosis Date   Arthritis    Asthma    Bowel obstruction (HCC)    Cancer (HCC)    Environmental allergies    GERD (gastroesophageal reflux disease)    Glaucoma 05/09/2017   H. pylori infection    History of radiation therapy 07/05/20-08/05/20   Left lung IMRT Dr. Sondra Come    HLD (hyperlipidemia) 01/16/2019   Hypertension    Iron deficiency anemia    Osteopenia 05/26/2017   Thyroid disease    Urticaria 07/25/2018   Past Surgical History:  Procedure Laterality Date   ABDOMINAL HYSTERECTOMY     BOWEL RESECTION N/A 05/23/2020   Procedure: SMALL BOWEL REPAIR;  Surgeon: Kieth Brightly, Arta Bruce, MD;  Location: [facility];  Service: General;  Laterality: N/A;   BREAST BIOPSY     BRONCHIAL BIOPSY  06/10/2020   Procedure: BRONCHIAL BIOPSIES;  Surgeon: Garner Nash, DO;  Location: [facility];  Service: Pulmonary;;   BRONCHIAL BRUSHINGS  06/10/2020   Procedure: BRONCHIAL BRUSHINGS;  Surgeon: Garner Nash, DO;  Location: [facility];  Service: Pulmonary;;   BRONCHIAL NEEDLE ASPIRATION BIOPSY  06/10/2020   Procedure:  BRONCHIAL NEEDLE ASPIRATION BIOPSIES;  Surgeon: Garner Nash, DO;  Location: [facility];  Service: Pulmonary;;   BRONCHIAL WASHINGS  06/10/2020   Procedure: BRONCHIAL WASHINGS;  Surgeon: Garner Nash, DO;  Location: [facility];  Service: Pulmonary;;   COLON SURGERY     DIAGNOSTIC LARYNGOSCOPY N/A 05/23/2020   Procedure: ATTEMPTED DIAGNOSTIC LAPAROSCOPY WITH ADHESIONS;  Surgeon: Kieth Brightly, Arta Bruce, MD;  Location: [facility];  Service: General;  Laterality: N/A;   IR IMAGING GUIDED PORT INSERTION  07/15/2020   KNEE SURGERY     LAPAROTOMY N/A 04/18/2015   Procedure: Exploratory laparotomy with lysis of adhesions, possible bowel resection;  Surgeon: Ralene Ok, MD;  Location: [facility];  Service: General;  Laterality: N/A;   LAPAROTOMY N/A 05/23/2020   Procedure: EXPLORATORY LAPAROTOMY;  Surgeon: Mickeal Skinner, MD;  Location: [facility];  Service: General;  Laterality: N/A;   LYSIS OF ADHESION N/A 05/23/2020   Procedure: LYSIS OF ADHESION;  Surgeon: Kieth Brightly Arta Bruce, MD;  Location: [facility];  Service: General;  Laterality: N/A;   NASAL SINUS SURGERY     THYROID SURGERY     VIDEO BRONCHOSCOPY WITH ENDOBRONCHIAL NAVIGATION Bilateral 06/10/2020   Procedure: VIDEO BRONCHOSCOPY WITH ENDOBRONCHIAL NAVIGATION;  Surgeon: Garner Nash, DO;  Location: [facility];  Service: Pulmonary;  Laterality: Bilateral;   VIDEO BRONCHOSCOPY WITH ENDOBRONCHIAL ULTRASOUND  06/10/2020   Procedure: VIDEO BRONCHOSCOPY WITH ENDOBRONCHIAL ULTRASOUND;  Surgeon: Garner Nash, DO;  Location: [facility];  Service: Pulmonary;;    reports that she has quit smoking. Her smoking use included cigarettes. She has never used smokeless tobacco. She reports that she does not drink alcohol and does not use drugs. family history includes Diabetes in her mother; Glaucoma in her brother and sister; Hypertension in her father and mother. Allergies  Allergen Reactions   Asa Buff (Mag [Buffered Aspirin] Other (See Comments)     Excessive sweating   Ensure [Nutritional Supplements]     Or boost - upset stomach    Fish Allergy Other (See Comments)    Unknown reaction, but allergic   Other Other (See Comments)    Gelcaps; gel-containing capsules; extended-release medication - upset stomach    Peanut-Containing Drug Products Other (See Comments)    From allergy test   Penicillins Itching    Has patient had a PCN reaction causing immediate rash, facial/tongue/throat swelling, SOB or lightheadedness with hypotension: No Has patient had a PCN reaction causing severe rash involving mucus membranes or skin necrosis: No Has patient had a PCN reaction that required hospitalization No Has patient had a PCN reaction occurring within the last 10 years: No If all of the above answers are "NO", then may proceed with Cephalosporin use.   Shellfish-Derived Products Other (See Comments)    From allergy test   Strawberry Extract Other (See Comments)    From allergy test    Current Outpatient Medications on File Prior to Visit  Medication Sig Dispense Refill   acetaminophen (TYLENOL) 325 MG tablet Take 1 tablet (325 mg total) by mouth every 6 (six) hours as needed for headache or mild pain.     alum & mag hydroxide-simeth (MAALOX/MYLANTA) 200-200-20 MG/5ML suspension Take 15 mLs by mouth every 6 (six) hours as needed for indigestion or heartburn.     amLODipine (NORVASC) 10 MG tablet Take 10 mg by mouth daily.     Benralizumab (FASENRA PEN) 30 MG/ML SOAJ Inject into the skin every 8 (eight) weeks.     budesonide-formoterol (SYMBICORT) 80-4.5 MCG/ACT inhaler Inhale 2 puffs into the lungs 2 (two) times daily. Use for asthma flares 2 puffs twice a day for 2 weeks or until cough and wheeze free 10.2 g 5   cephALEXin (KEFLEX) 500 MG capsule Take 1 capsule (500 mg total) by mouth 4 (four) times daily for 8 days. 32 capsule 0   Cholecalciferol (VITAMIN D3) 50 MCG (2000 UT) TABS Take 2,000 Units by mouth daily.     Durvalumab (IMFINZI IV)  Inject 1,500 mg into the vein every 28 (twenty-eight) days. Every 4 weeks     EPINEPHrine (EPIPEN 2-PAK) 0.3 mg/0.3 mL IJ SOAJ injection Use as directed for severe allergic reaction 2 each 1   ipratropium (ATROVENT) 0.06 % nasal spray Place 2 sprays into both nostrils 3 (three) times daily. 15 mL 3   linaclotide (LINZESS) 72 MCG capsule TAKE 1 CAPSULE BY MOUTH ONCE DAILY AS NEEDED (Patient taking differently: Take 72 mcg by mouth daily as needed (constipation).) 30 capsule 0   loratadine (CLARITIN) 10 MG tablet Take 10 mg by mouth daily.     Magnesium Hydroxide (PHILLIPS MILK OF MAGNESIA PO) Take 15-30 mLs by mouth daily as needed (for constipation).     montelukast (SINGULAIR) 10 MG tablet Take 1 tablet (10 mg  total) by mouth at bedtime. 30 tablet 5   ondansetron (ZOFRAN) 8 MG tablet Take 1 tablet (8 mg total) by mouth every 8 (eight) hours as needed for nausea or vomiting. 20 tablet 0   polyethylene glycol (MIRALAX / GLYCOLAX) 17 g packet Take 17 g by mouth daily as needed for mild constipation.     pravastatin (PRAVACHOL) 80 MG tablet Take 1 tablet (80 mg total) by mouth daily. 90 tablet 3   prochlorperazine (COMPAZINE) 10 MG tablet Take 1 tablet (10 mg total) by mouth every 6 (six) hours as needed for nausea or vomiting. 30 tablet 0   traMADol (ULTRAM) 50 MG tablet Take 1 tablet (50 mg total) by mouth every 6 (six) hours as needed for moderate pain. 30 tablet 2   vitamin B-12 (CYANOCOBALAMIN) 1000 MCG tablet Take 1,000 mcg by mouth daily.     Current Facility-Administered Medications on File Prior to Visit  Medication Dose Route Frequency Provider Last Rate Last Admin   Benralizumab SOSY 30 mg  30 mg Subcutaneous Q8 Weeks Kennith Gain, MD   30 mg at 10/25/20 0917        ROS:  All others reviewed and negative.  Objective        PE:  BP 138/76 (BP Location: Left Arm, Patient Position: Sitting, Cuff Size: Normal)   Pulse 79   Temp 98.4 F (36.9 C) (Oral)   Ht 5\' 5"  (1.651 m)    Wt 149 lb 9.6 oz (67.9 kg)   SpO2 97%   BMI 24.89 kg/m                 Constitutional: Pt appears in NAD               HENT: Head: NCAT.                Right Ear: External ear normal.                 Left Ear: External ear normal.                Eyes: . Pupils are equal, round, and reactive to light. Conjunctivae and EOM are normal               Nose: without d/c or deformity               Neck: Neck supple. Gross normal ROM               Cardiovascular: Normal rate and regular rhythm.                 Pulmonary/Chest: Effort normal and breath sounds without rales or wheezing.                Abd:  Soft, NT, ND, + BS, no organomegaly               Neurological: Pt is alert. At baseline orientation, motor grossly intact               Skin: Skin is warm. No rashes, no other new lesions, LE edema - trace left pedal               Psychiatric: Pt behavior is normal without agitation   Micro: none  Cardiac tracings I have personally interpreted today:  none  Pertinent Radiological findings (summarize): none   Lab Results  Component Value Date   WBC 6.0 11/08/2020   HGB 9.8 (L) 11/08/2020  HCT 29.2 (L) 11/08/2020   PLT 229 11/08/2020   GLUCOSE 89 11/08/2020   CHOL 208 (H) 05/03/2020   TRIG 99 05/30/2020   HDL 69.90 05/03/2020   LDLCALC 120 (H) 05/03/2020   ALT 14 11/08/2020   AST 23 11/08/2020   NA 140 11/08/2020   K 3.1 (L) 11/08/2020   CL 110 11/08/2020   CREATININE 0.61 11/08/2020   BUN <5 (L) 11/08/2020   CO2 25 11/08/2020   TSH 4.230 11/02/2020   INR 1.0 11/02/2020   HGBA1C 5.8 05/03/2020   Assessment/Plan:  TAMYRA FOJTIK is a 80 y.o. Black or African American [2] female with  has a past medical history of Arthritis, Asthma, Bowel obstruction ([place]), Cancer ([place]), Environmental allergies, GERD (gastroesophageal reflux disease), Glaucoma (05/09/2017), H. pylori infection, History of radiation therapy (07/05/20-08/05/20), HLD (hyperlipidemia) (01/16/2019), Hypertension,  Iron deficiency anemia, Osteopenia (05/26/2017), Thyroid disease, and Urticaria (07/25/2018).  Bacteremia due to Klebsiella pneumoniae symptomaticlally resovled, to finish oral antibx,  to f/u any worsening symptoms or concerns   Swelling of lower extremity Trace worsening on the amlodipien to continue to follow for now  Hyperglycemia Lab Results  Component Value Date   HGBA1C 5.8 05/03/2020   Stable, pt to continue current medical treatment  - diet   HTN (hypertension) Stable on amlodipine, to continue to monitor at home and suspect may need to restart telmisartan at about 3-4 wks post hospn  Constipation Stable for now,  to f/u any worsening symptoms or concerns  Followup: Return in about 3 months (around 02/14/2021).  Cathlean Cower, MD 11/14/2020 9:30 PM [facility]

## 2020-11-14 NOTE — Telephone Encounter (Signed)
   Patients daughter called and is requesting a note stating that the patient was at her appointment today for her work. She said that she is needing it by tomorrow. She was wondering if it could be sent to the patients mychart. Please advise

## 2020-11-14 NOTE — Patient Instructions (Signed)
Please continue all other medications as before, and refills have been done if requested.  Please have the pharmacy call with any other refills you may need.  Please continue your efforts at being more active, low cholesterol diet, and weight control.  You are otherwise up to date with prevention measures today.  Please keep your appointments with your specialists as you may have planned  Please continue to monitor the Blood Pressure as it will likely increase again in 3-4 more weeks  Please make an Appointment to return in 3 months

## 2020-11-14 NOTE — Assessment & Plan Note (Signed)
Stable on amlodipine, to continue to monitor at home and suspect may need to restart telmisartan at about 3-4 wks post hospn

## 2020-11-14 NOTE — Assessment & Plan Note (Signed)
Lab Results  Component Value Date   HGBA1C 5.8 05/03/2020   Stable, pt to continue current medical treatment  - diet

## 2020-11-14 NOTE — Assessment & Plan Note (Signed)
Stable for now,  to f/u any worsening symptoms or concerns

## 2020-11-14 NOTE — Assessment & Plan Note (Signed)
symptomaticlally resovled, to finish oral antibx,  to f/u any worsening symptoms or concerns

## 2020-11-14 NOTE — Assessment & Plan Note (Signed)
Trace worsening on the amlodipien to continue to follow for now

## 2020-11-22 DIAGNOSIS — M170 Bilateral primary osteoarthritis of knee: Secondary | ICD-10-CM | POA: Diagnosis not present

## 2020-11-22 DIAGNOSIS — J4550 Severe persistent asthma, uncomplicated: Secondary | ICD-10-CM | POA: Diagnosis not present

## 2020-11-22 DIAGNOSIS — M7552 Bursitis of left shoulder: Secondary | ICD-10-CM | POA: Diagnosis not present

## 2020-11-22 DIAGNOSIS — C3492 Malignant neoplasm of unspecified part of left bronchus or lung: Secondary | ICD-10-CM | POA: Diagnosis not present

## 2020-11-22 DIAGNOSIS — M47816 Spondylosis without myelopathy or radiculopathy, lumbar region: Secondary | ICD-10-CM | POA: Diagnosis not present

## 2020-11-22 DIAGNOSIS — I10 Essential (primary) hypertension: Secondary | ICD-10-CM | POA: Diagnosis not present

## 2020-11-22 DIAGNOSIS — M7592 Shoulder lesion, unspecified, left shoulder: Secondary | ICD-10-CM | POA: Diagnosis not present

## 2020-11-22 DIAGNOSIS — M8580 Other specified disorders of bone density and structure, unspecified site: Secondary | ICD-10-CM | POA: Diagnosis not present

## 2020-11-22 DIAGNOSIS — M19012 Primary osteoarthritis, left shoulder: Secondary | ICD-10-CM | POA: Diagnosis not present

## 2020-11-28 DIAGNOSIS — M7552 Bursitis of left shoulder: Secondary | ICD-10-CM | POA: Diagnosis not present

## 2020-11-28 DIAGNOSIS — M170 Bilateral primary osteoarthritis of knee: Secondary | ICD-10-CM | POA: Diagnosis not present

## 2020-11-28 DIAGNOSIS — I10 Essential (primary) hypertension: Secondary | ICD-10-CM | POA: Diagnosis not present

## 2020-11-28 DIAGNOSIS — M19012 Primary osteoarthritis, left shoulder: Secondary | ICD-10-CM | POA: Diagnosis not present

## 2020-11-28 DIAGNOSIS — M47816 Spondylosis without myelopathy or radiculopathy, lumbar region: Secondary | ICD-10-CM | POA: Diagnosis not present

## 2020-11-28 DIAGNOSIS — C3492 Malignant neoplasm of unspecified part of left bronchus or lung: Secondary | ICD-10-CM | POA: Diagnosis not present

## 2020-11-28 DIAGNOSIS — J4550 Severe persistent asthma, uncomplicated: Secondary | ICD-10-CM | POA: Diagnosis not present

## 2020-11-28 DIAGNOSIS — M7592 Shoulder lesion, unspecified, left shoulder: Secondary | ICD-10-CM | POA: Diagnosis not present

## 2020-11-28 DIAGNOSIS — M8580 Other specified disorders of bone density and structure, unspecified site: Secondary | ICD-10-CM | POA: Diagnosis not present

## 2020-11-30 ENCOUNTER — Inpatient Hospital Stay: Payer: Medicare HMO | Attending: Internal Medicine | Admitting: Internal Medicine

## 2020-11-30 ENCOUNTER — Other Ambulatory Visit: Payer: Self-pay

## 2020-11-30 ENCOUNTER — Inpatient Hospital Stay: Payer: Medicare HMO

## 2020-11-30 VITALS — BP 149/84 | HR 87 | Temp 96.5°F | Resp 19 | Ht 65.0 in | Wt 147.6 lb

## 2020-11-30 DIAGNOSIS — C3490 Malignant neoplasm of unspecified part of unspecified bronchus or lung: Secondary | ICD-10-CM

## 2020-11-30 DIAGNOSIS — E785 Hyperlipidemia, unspecified: Secondary | ICD-10-CM | POA: Insufficient documentation

## 2020-11-30 DIAGNOSIS — J45909 Unspecified asthma, uncomplicated: Secondary | ICD-10-CM | POA: Diagnosis not present

## 2020-11-30 DIAGNOSIS — Z9221 Personal history of antineoplastic chemotherapy: Secondary | ICD-10-CM | POA: Insufficient documentation

## 2020-11-30 DIAGNOSIS — C3492 Malignant neoplasm of unspecified part of left bronchus or lung: Secondary | ICD-10-CM

## 2020-11-30 DIAGNOSIS — R609 Edema, unspecified: Secondary | ICD-10-CM | POA: Diagnosis not present

## 2020-11-30 DIAGNOSIS — K219 Gastro-esophageal reflux disease without esophagitis: Secondary | ICD-10-CM | POA: Insufficient documentation

## 2020-11-30 DIAGNOSIS — M48061 Spinal stenosis, lumbar region without neurogenic claudication: Secondary | ICD-10-CM | POA: Insufficient documentation

## 2020-11-30 DIAGNOSIS — Z880 Allergy status to penicillin: Secondary | ICD-10-CM | POA: Insufficient documentation

## 2020-11-30 DIAGNOSIS — I10 Essential (primary) hypertension: Secondary | ICD-10-CM | POA: Insufficient documentation

## 2020-11-30 DIAGNOSIS — M47816 Spondylosis without myelopathy or radiculopathy, lumbar region: Secondary | ICD-10-CM | POA: Insufficient documentation

## 2020-11-30 DIAGNOSIS — Z79899 Other long term (current) drug therapy: Secondary | ICD-10-CM | POA: Insufficient documentation

## 2020-11-30 DIAGNOSIS — Z95828 Presence of other vascular implants and grafts: Secondary | ICD-10-CM | POA: Diagnosis not present

## 2020-11-30 DIAGNOSIS — Z886 Allergy status to analgesic agent status: Secondary | ICD-10-CM | POA: Diagnosis not present

## 2020-11-30 DIAGNOSIS — I700 Atherosclerosis of aorta: Secondary | ICD-10-CM | POA: Diagnosis not present

## 2020-11-30 DIAGNOSIS — M5137 Other intervertebral disc degeneration, lumbosacral region: Secondary | ICD-10-CM | POA: Diagnosis not present

## 2020-11-30 DIAGNOSIS — C778 Secondary and unspecified malignant neoplasm of lymph nodes of multiple regions: Secondary | ICD-10-CM | POA: Insufficient documentation

## 2020-11-30 DIAGNOSIS — K429 Umbilical hernia without obstruction or gangrene: Secondary | ICD-10-CM | POA: Diagnosis not present

## 2020-11-30 DIAGNOSIS — K828 Other specified diseases of gallbladder: Secondary | ICD-10-CM | POA: Insufficient documentation

## 2020-11-30 DIAGNOSIS — C3432 Malignant neoplasm of lower lobe, left bronchus or lung: Secondary | ICD-10-CM | POA: Insufficient documentation

## 2020-11-30 DIAGNOSIS — M47814 Spondylosis without myelopathy or radiculopathy, thoracic region: Secondary | ICD-10-CM | POA: Diagnosis not present

## 2020-11-30 DIAGNOSIS — R5383 Other fatigue: Secondary | ICD-10-CM | POA: Insufficient documentation

## 2020-11-30 DIAGNOSIS — M8580 Other specified disorders of bone density and structure, unspecified site: Secondary | ICD-10-CM | POA: Insufficient documentation

## 2020-11-30 DIAGNOSIS — Z923 Personal history of irradiation: Secondary | ICD-10-CM | POA: Insufficient documentation

## 2020-11-30 LAB — CBC WITH DIFFERENTIAL (CANCER CENTER ONLY)
Abs Immature Granulocytes: 0.01 10*3/uL (ref 0.00–0.07)
Basophils Absolute: 0 10*3/uL (ref 0.0–0.1)
Basophils Relative: 1 %
Eosinophils Absolute: 0 10*3/uL (ref 0.0–0.5)
Eosinophils Relative: 0 %
HCT: 35.7 % — ABNORMAL LOW (ref 36.0–46.0)
Hemoglobin: 12.4 g/dL (ref 12.0–15.0)
Immature Granulocytes: 0 %
Lymphocytes Relative: 21 %
Lymphs Abs: 1.1 10*3/uL (ref 0.7–4.0)
MCH: 31.1 pg (ref 26.0–34.0)
MCHC: 34.7 g/dL (ref 30.0–36.0)
MCV: 89.5 fL (ref 80.0–100.0)
Monocytes Absolute: 0.6 10*3/uL (ref 0.1–1.0)
Monocytes Relative: 11 %
Neutro Abs: 3.6 10*3/uL (ref 1.7–7.7)
Neutrophils Relative %: 67 %
Platelet Count: 210 10*3/uL (ref 150–400)
RBC: 3.99 MIL/uL (ref 3.87–5.11)
RDW: 14.1 % (ref 11.5–15.5)
WBC Count: 5.4 10*3/uL (ref 4.0–10.5)
nRBC: 0 % (ref 0.0–0.2)

## 2020-11-30 LAB — CMP (CANCER CENTER ONLY)
ALT: 11 U/L (ref 0–44)
AST: 21 U/L (ref 15–41)
Albumin: 3.4 g/dL — ABNORMAL LOW (ref 3.5–5.0)
Alkaline Phosphatase: 67 U/L (ref 38–126)
Anion gap: 7 (ref 5–15)
BUN: 10 mg/dL (ref 8–23)
CO2: 24 mmol/L (ref 22–32)
Calcium: 9.3 mg/dL (ref 8.9–10.3)
Chloride: 108 mmol/L (ref 98–111)
Creatinine: 0.83 mg/dL (ref 0.44–1.00)
GFR, Estimated: >60 mL/min (ref >60)
Glucose, Bld: 86 mg/dL (ref 70–99)
Potassium: 3.9 mmol/L (ref 3.5–5.1)
Sodium: 139 mmol/L (ref 135–145)
Total Bilirubin: 0.3 mg/dL (ref 0.3–1.2)
Total Protein: 7.1 g/dL (ref 6.5–8.1)

## 2020-11-30 LAB — TSH: TSH: 8.167 u[IU]/mL — ABNORMAL HIGH (ref 0.308–3.960)

## 2020-11-30 MED ORDER — SODIUM CHLORIDE 0.9% FLUSH
10.0000 mL | Freq: Once | INTRAVENOUS | Status: AC
  Administered 2020-11-30: 10 mL
  Filled 2020-11-30: qty 10

## 2020-11-30 MED ORDER — HEPARIN SOD (PORK) LOCK FLUSH 100 UNIT/ML IV SOLN
500.0000 [IU] | Freq: Once | INTRAVENOUS | Status: AC
  Administered 2020-11-30: 500 [IU]
  Filled 2020-11-30: qty 5

## 2020-11-30 NOTE — Patient Instructions (Signed)

## 2020-11-30 NOTE — Progress Notes (Signed)
[place] [phone]   [phone]  OFFICE PROGRESS NOTE  [name], MD [place] [address]  DIAGNOSIS: Stage IIIB (T2 a, N3, M0) non-small cell lung cancer, adenocarcinoma presented with left lower lobe lung mass in addition to left hilar and mediastinal as well as right and left cervical lymphadenopathy diagnosed in January 2022  PRIOR THERAPY:  1) Concurrent chemoradiation with weekly carboplatin for AUC of 2 and paclitaxel 45 mg/M2.  Status post 5 cycles.  Last dose was given August 01, 2020. 2) Consolidation treatment with immunotherapy with Imfinzi 1500 mg IV every 4 weeks.  First dose September 06, 2020.  Status post 3 cycles.  This treatment was discontinued secondary to intolerance and hospital admission with sepsis.  CURRENT THERAPY: Observation.  INTERVAL HISTORY: Angelica [name] 80 y.o. female returns to the clinic today for follow-up visit accompanied by her daughter.  The patient is feeling fine today with no concerning complaints.  She was admitted to the hospital after the last cycle of her treatment with SIRS secondary to bacteremia with Klebsiella pneumonia during her infusion.  The patient was treated with cefepime and vancomycin initially transitioned to ceftriaxone then Ancef and discharged home on Keflex.  The patient denied having any current chest pain, shortness of breath, cough or hemoptysis.  She has no nausea, vomiting, diarrhea or constipation.  She has no headache or visual changes.  She has no current fever or chills.  She had repeat CT scan of the chest, abdomen pelvis performed recently and she is here for evaluation and recommendation regarding her condition.   MEDICAL HISTORY: Past Medical History:  Diagnosis Date   Arthritis    Asthma    Bowel obstruction (HCC)    Cancer (HCC)    Environmental allergies    GERD (gastroesophageal reflux disease)    Glaucoma 05/09/2017   H. pylori infection     History of radiation therapy 07/05/20-08/05/20   Left lung IMRT Dr. Sondra Come    HLD (hyperlipidemia) 01/16/2019   Hypertension    Iron deficiency anemia    Osteopenia 05/26/2017   Thyroid disease    Urticaria 07/25/2018    ALLERGIES:  is allergic to asa buff (mag [buffered aspirin], ensure [nutritional supplements], fish allergy, other, peanut-containing drug products, penicillins, shellfish-derived products, and strawberry extract.  MEDICATIONS:  Current Outpatient Medications  Medication Sig Dispense Refill   acetaminophen (TYLENOL) 325 MG tablet Take 1 tablet (325 mg total) by mouth every 6 (six) hours as needed for headache or mild pain.     alum & mag hydroxide-simeth (MAALOX/MYLANTA) 200-200-20 MG/5ML suspension Take 15 mLs by mouth every 6 (six) hours as needed for indigestion or heartburn.     amLODipine (NORVASC) 10 MG tablet Take 10 mg by mouth daily.     Benralizumab (FASENRA PEN) 30 MG/ML SOAJ Inject into the skin every 8 (eight) weeks.     budesonide-formoterol (SYMBICORT) 80-4.5 MCG/ACT inhaler Inhale 2 puffs into the lungs 2 (two) times daily. Use for asthma flares 2 puffs twice a day for 2 weeks or until cough and wheeze free 10.2 g 5   Cholecalciferol (VITAMIN D3) 50 MCG (2000 UT) TABS Take 2,000 Units by mouth daily.     Durvalumab (IMFINZI IV) Inject 1,500 mg into the vein every 28 (twenty-eight) days. Every 4 weeks     EPINEPHrine (EPIPEN 2-PAK) 0.3 mg/0.3 mL IJ SOAJ injection Use as directed for severe allergic reaction 2 each  1   ipratropium (ATROVENT) 0.06 % nasal spray Place 2 sprays into both nostrils 3 (three) times daily. 15 mL 3   linaclotide (LINZESS) 72 MCG capsule TAKE 1 CAPSULE BY MOUTH ONCE DAILY AS NEEDED (Patient taking differently: Take 72 mcg by mouth daily as needed (constipation).) 30 capsule 0   loratadine (CLARITIN) 10 MG tablet Take 10 mg by mouth daily.     Magnesium Hydroxide (PHILLIPS MILK OF MAGNESIA PO) Take 15-30 mLs by mouth daily as needed (for  constipation).     montelukast (SINGULAIR) 10 MG tablet Take 1 tablet (10 mg total) by mouth at bedtime. 30 tablet 5   ondansetron (ZOFRAN) 8 MG tablet Take 1 tablet (8 mg total) by mouth every 8 (eight) hours as needed for nausea or vomiting. 20 tablet 0   polyethylene glycol (MIRALAX / GLYCOLAX) 17 g packet Take 17 g by mouth daily as needed for mild constipation.     pravastatin (PRAVACHOL) 80 MG tablet Take 1 tablet (80 mg total) by mouth daily. 90 tablet 3   prochlorperazine (COMPAZINE) 10 MG tablet Take 1 tablet (10 mg total) by mouth every 6 (six) hours as needed for nausea or vomiting. 30 tablet 0   traMADol (ULTRAM) 50 MG tablet Take 1 tablet (50 mg total) by mouth every 6 (six) hours as needed for moderate pain. 30 tablet 2   vitamin B-12 (CYANOCOBALAMIN) 1000 MCG tablet Take 1,000 mcg by mouth daily.     Current Facility-Administered Medications  Medication Dose Route Frequency Provider Last Rate Last Admin   Benralizumab SOSY 30 mg  30 mg Subcutaneous Q8 Weeks Kennith Gain, MD   30 mg at 10/25/20 7026    SURGICAL HISTORY:  Past Surgical History:  Procedure Laterality Date   ABDOMINAL HYSTERECTOMY     BOWEL RESECTION N/A 05/23/2020   Procedure: SMALL BOWEL REPAIR;  Surgeon: Kieth Brightly Arta Bruce, MD;  Location: [place];  Service: General;  Laterality: N/A;   BREAST BIOPSY     BRONCHIAL BIOPSY  06/10/2020   Procedure: BRONCHIAL BIOPSIES;  Surgeon: Garner Nash, DO;  Location: [facility];  Service: Pulmonary;;   BRONCHIAL BRUSHINGS  06/10/2020   Procedure: BRONCHIAL BRUSHINGS;  Surgeon: Garner Nash, DO;  Location: [employer] [facility];  Service: Pulmonary;;   BRONCHIAL NEEDLE ASPIRATION BIOPSY  06/10/2020   Procedure: BRONCHIAL NEEDLE ASPIRATION BIOPSIES;  Surgeon: Garner Nash, DO;  Location: [place];  Service: Pulmonary;;   BRONCHIAL WASHINGS  06/10/2020   Procedure: BRONCHIAL WASHINGS;  Surgeon: Garner Nash, DO;  Location: [facility];  Service:  Pulmonary;;   COLON SURGERY     DIAGNOSTIC LARYNGOSCOPY N/A 05/23/2020   Procedure: ATTEMPTED DIAGNOSTIC LAPAROSCOPY WITH ADHESIONS;  Surgeon: Kieth Brightly, Arta Bruce, MD;  Location: [place];  Service: General;  Laterality: N/A;   IR IMAGING GUIDED PORT INSERTION  07/15/2020   KNEE SURGERY     LAPAROTOMY N/A 04/18/2015   Procedure: Exploratory laparotomy with lysis of adhesions, possible bowel resection;  Surgeon: Ralene Ok, MD;  Location: [place];  Service: General;  Laterality: N/A;   LAPAROTOMY N/A 05/23/2020   Procedure: EXPLORATORY LAPAROTOMY;  Surgeon: Mickeal Skinner, MD;  Location: [place];  Service: General;  Laterality: N/A;   LYSIS OF ADHESION N/A 05/23/2020   Procedure: LYSIS OF ADHESION;  Surgeon: Kieth Brightly Arta Bruce, MD;  Location: [place];  Service: General;  Laterality: N/A;   NASAL SINUS SURGERY     THYROID SURGERY     VIDEO BRONCHOSCOPY WITH ENDOBRONCHIAL  NAVIGATION Bilateral 06/10/2020   Procedure: VIDEO BRONCHOSCOPY WITH ENDOBRONCHIAL NAVIGATION;  Surgeon: Garner Nash, DO;  Location: [place];  Service: Pulmonary;  Laterality: Bilateral;   VIDEO BRONCHOSCOPY WITH ENDOBRONCHIAL ULTRASOUND  06/10/2020   Procedure: VIDEO BRONCHOSCOPY WITH ENDOBRONCHIAL ULTRASOUND;  Surgeon: Garner Nash, DO;  Location: [place] [facility];  Service: Pulmonary;;    REVIEW OF SYSTEMS:  Constitutional: positive for fatigue Eyes: negative Ears, nose, mouth, throat, and face: negative Respiratory: negative Cardiovascular: negative Gastrointestinal: negative Genitourinary:negative Integument/breast: negative Hematologic/lymphatic: negative Musculoskeletal:negative Neurological: negative Behavioral/Psych: negative Endocrine: negative Allergic/Immunologic: negative   PHYSICAL EXAMINATION: General appearance: alert, cooperative, fatigued, and no distress Head: Normocephalic, without obvious abnormality, atraumatic Neck: no adenopathy, no JVD, supple, symmetrical, trachea midline, and  thyroid not enlarged, symmetric, no tenderness/mass/nodules Lymph nodes: Cervical, supraclavicular, and axillary nodes normal. Resp: clear to auscultation bilaterally Back: symmetric, no curvature. ROM normal. No CVA tenderness. Cardio: regular rate and rhythm, S1, S2 normal, no murmur, click, rub or gallop GI: soft, non-tender; bowel sounds normal; no masses,  no organomegaly Extremities: extremities normal, atraumatic, no cyanosis or edema Neurologic: Alert and oriented X 3, normal strength and tone. Normal symmetric reflexes. Normal coordination and gait  ECOG PERFORMANCE STATUS: 1 - Symptomatic but completely ambulatory  Blood pressure (!) 149/84, pulse 87, temperature (!) 96.5 F (35.8 C), temperature source Tympanic, resp. rate 19, height 5\' 5"  (1.651 m), weight 147 lb 9.6 oz (67 kg), SpO2 99 %.  LABORATORY DATA: Lab Results  Component Value Date   WBC 6.0 11/08/2020   HGB 9.8 (L) 11/08/2020   HCT 29.2 (L) 11/08/2020   MCV 92.7 11/08/2020   PLT 229 11/08/2020      Chemistry      Component Value Date/Time   NA 140 11/08/2020 0538   K 3.1 (L) 11/08/2020 0538   CL 110 11/08/2020 0538   CO2 25 11/08/2020 0538   BUN <5 (L) 11/08/2020 0538   CREATININE 0.61 11/08/2020 0538   CREATININE 0.77 11/02/2020 0818      Component Value Date/Time   CALCIUM 8.7 (L) 11/08/2020 0538   ALKPHOS 47 11/08/2020 0538   AST 23 11/08/2020 0538   AST 18 11/02/2020 0818   ALT 14 11/08/2020 0538   ALT 9 11/02/2020 0818   BILITOT 0.3 11/08/2020 0538   BILITOT 0.3 11/02/2020 0818       RADIOGRAPHIC STUDIES: CT CHEST W CONTRAST  Result Date: 11/05/2020 CLINICAL DATA:  Non-small cell lung cancer on immunotherapy. Patient with fever. EXAM: CT CHEST WITH CONTRAST TECHNIQUE: Multidetector CT imaging of the chest was performed during intravenous contrast administration. CONTRAST:  25mL OMNIPAQUE IOHEXOL 300 MG/ML  SOLN COMPARISON:  CT chest 08/29/2020. FINDINGS: Cardiovascular: Right anterior  chest wall Port-A-Cath is present with tip terminating in the superior vena cava. Normal heart size. Coronary arterial vascular calcifications. Trace fluid superior pericardial recess. Thoracic aortic vascular calcifications. There is a possible small filling defect within a distal subsegmental right lower lobe pulmonary artery (image 82; series 3). Mediastinum/Nodes: No enlarged axillary, mediastinal or hilar lymphadenopathy. Normal appearance of the esophagus. Persistent enlarged heterogeneous thyroid. Lungs/Pleura: Central airways are patent. Continued interval decrease in size of left lower lobe nodule measuring 1.6 x 1.7 cm (image 98; series 8), previously 2.1 x 2.2 cm. Slight interval increase in patchy consolidative opacities within the more peripheral left lower lobe. Interval development of small bilateral pleural effusions. Minimal right basilar atelectasis. No pneumothorax. Stable 3 mm peripheral right middle lobe nodule (image 74; series 8). Upper Abdomen:  No acute process. Musculoskeletal: Thoracic spine degenerative changes. No aggressive or acute appearing osseous lesions. IMPRESSION: 1. Suggestion of a small filling defect within a peripheral right lower lobe subsegmental pulmonary artery, potentially representing distal pulmonary embolus versus artifact. If clinically warranted, consider further evaluation with PE CT. 2. Interval decrease in size of left lower lobe nodule. 3. Interval development of patchy consolidative opacities within the left lower lobe which may represent atelectasis or infection. 4. Small bilateral pleural effusions. 5. Enlarged heterogeneous thyroid. Recommend thyroid ultrasound if and when clinically appropriate in the setting of metastatic malignancy. 6. Stable 3 mm nodule peripheral right middle lobe. Recommend continued attention on follow-up. 7. These results were called by telephone at the time of interpretation on 11/05/2020 at 8:59 am to provider A Cephus Slater , who  verbally acknowledged these results. Electronically Signed   By: Lovey Newcomer M.D.   On: 11/05/2020 09:00   CT Angio Chest Pulmonary Embolism (PE) W or WO Contrast  Result Date: 11/05/2020 CLINICAL DATA:  Positive D-dimer. Non-small cell lung carcinoma undergoing immunotherapy. EXAM: CT ANGIOGRAPHY CHEST WITH CONTRAST TECHNIQUE: Multidetector CT imaging of the chest was performed using the standard protocol during bolus administration of intravenous contrast. Multiplanar CT image reconstructions and MIPs were obtained to evaluate the vascular anatomy. CONTRAST:  67mL OMNIPAQUE IOHEXOL 350 MG/ML SOLN COMPARISON:  Routine chest CT on 11/04/2020 FINDINGS: Cardiovascular: Satisfactory opacification of pulmonary arteries noted, and no pulmonary emboli identified. No evidence of thoracic aortic dissection or aneurysm. Aortic and coronary atherosclerotic calcification noted. Mediastinum/Nodes: Stable diffuse goiter. No pathologically enlarged lymph nodes identified. Lungs/Pleura: Scarring/post treatment change is again seen in the left lower lobe. No discrete mass identified. No other suspicious pulmonary nodules or masses demonstrated. No evidence of pulmonary consolidation. Upper abdomen: No acute findings. Musculoskeletal: No suspicious bone lesions identified. Review of the MIP images confirms the above findings. IMPRESSION: No evidence of pulmonary embolism or other acute findings. Stable left lower lobe scarring/post treatment change. No evidence of recurrent or metastatic carcinoma. Stable diffuse goiter. Aortic Atherosclerosis (ICD10-I70.0). Electronically Signed   By: Marlaine Hind M.D.   On: 11/05/2020 11:16   CT ABDOMEN PELVIS W CONTRAST  Result Date: 11/07/2020 CLINICAL DATA:  Abdominal abscess/infection suspected Air and chills, admitted with Klebsiella bacteremia. History of non-small cell lung cancer, active chemotherapy. EXAM: CT ABDOMEN AND PELVIS WITH CONTRAST TECHNIQUE: Multidetector CT imaging of  the abdomen and pelvis was performed using the standard protocol following bolus administration of intravenous contrast. CONTRAST:  159mL OMNIPAQUE IOHEXOL 300 MG/ML  SOLN COMPARISON:  Most recent comparison CT 10/16/2020. Chest CTA 11/05/2020. FINDINGS: Lower chest: Trace right and small left pleural effusion. Spiculated density in the left lower lobe is unchanged from recent chest CT. Hepatobiliary: No focal liver lesion or liver abnormality. Partially distended gallbladder without pericholecystic inflammation. Small gallstones versus wall calcification within a Phrygian cap. No biliary dilatation. Pancreas: Mild parenchymal atrophy. No ductal dilatation or inflammation. Spleen: Normal in size without focal abnormality. Adrenals/Urinary Tract: No adrenal nodule. Extrarenal pelvis configuration of both kidneys. No hydronephrosis, perinephric edema, or abnormal enhancement. Symmetric excretion on delayed phase imaging. No focal renal abnormality. Unremarkable urinary bladder. Stomach/Bowel: Partially distended stomach without gastric wall thickening. Minimal distal small bowel diverticulosis. No small bowel obstruction or inflammation. Administered enteric contrast reaches the distal ileal bowel loops. Appendix not confidently visualized. No appendicitis. Moderate volume of colonic stool. Mild colonic redundancy. There is no colonic wall thickening or inflammatory change. Vascular/Lymphatic: Aortic atherosclerosis. No aortic aneurysm. No  acute aortic findings. Patent portal vein. No enlarged lymph nodes in the abdomen or pelvis. Reproductive: Hysterectomy.  No adnexal mass. Other: Minimal presacral soft tissue edema is nonspecific. No ascites or abscess. No free air. Postsurgical change of the anterior abdominal wall with small fat containing umbilical hernia in the upper abdomen. Musculoskeletal: There are no acute or suspicious osseous abnormalities. Multilevel degenerative change in the lumbar spine, prominent  multilevel facet hypertrophy. Diffuse degenerative disc disease with vacuum phenomenon, disc space narrowing at L5-S1. IMPRESSION: 1. No acute abnormality in the abdomen/pelvis. No evidence of intra-abdominal infection or abscess. 2. Moderate colonic stool burden with mild colonic redundancy, can be seen with constipation. No bowel obstruction or inflammation. 3. Small gallstones versus wall calcification within a Phrygian cap. No pericholecystic inflammation. Aortic Atherosclerosis (ICD10-I70.0). Electronically Signed   By: Keith Rake M.D.   On: 11/07/2020 19:14   DG Chest Port 1 View  Result Date: 11/02/2020 CLINICAL DATA:  Fever. EXAM: PORTABLE CHEST 1 VIEW COMPARISON:  October 19, 2020 FINDINGS: Port-A-Cath tip is in the superior vena cava. No pneumothorax. No edema or airspace opacity. Heart size and pulmonary vascularity normal. No adenopathy. There are surgical clips at the right cervical-thoracic junction. IMPRESSION: Port-A-Cath as described without pneumothorax. Lungs clear. Cardiac silhouette normal. Aortic Atherosclerosis (ICD10-I70.0). Electronically Signed   By: Lowella Grip III M.D.   On: 11/02/2020 13:28   VAS [place] LOWER EXTREMITY VENOUS (DVT)  Result Date: 11/05/2020  Lower Venous DVT Study Patient Name:  Angelica [name]  Date of Exam:   11/05/2020 Medical Rec #: [id number]         Accession #:    [id number] Date of Birth: Nov 22, 1940          Patient Gender: F Patient Age:   79Y Exam Location:  [facility] Procedure:      VAS [place] LOWER EXTREMITY VENOUS (DVT) Referring Phys: [id number] A CALDWELL POWELL JR --------------------------------------------------------------------------------  Indications: "swelling" Per MD order.  Comparison Study: Previous exam 08/22/16 (RLE) - negative Performing Technologist: Jody Hill RVT, RDMS  Examination Guidelines: A complete evaluation includes B-mode imaging, spectral Doppler, color Doppler, and power Doppler as needed of all accessible portions of  each vessel. Bilateral testing is considered an integral part of a complete examination. Limited examinations for reoccurring indications may be performed as noted. The reflux portion of the exam is performed with the patient in reverse Trendelenburg.  +---------+---------------+---------+-----------+----------+--------------+ RIGHT    CompressibilityPhasicitySpontaneityPropertiesThrombus Aging +---------+---------------+---------+-----------+----------+--------------+ CFV      Full           Yes      Yes                                 +---------+---------------+---------+-----------+----------+--------------+ SFJ      Full                                                        +---------+---------------+---------+-----------+----------+--------------+ FV Prox  Full           Yes      Yes                                 +---------+---------------+---------+-----------+----------+--------------+ FV Mid  Full           Yes      Yes                                 +---------+---------------+---------+-----------+----------+--------------+ FV DistalFull           Yes      Yes                                 +---------+---------------+---------+-----------+----------+--------------+ PFV      Full                                                        +---------+---------------+---------+-----------+----------+--------------+ POP      Full           Yes      Yes                                 +---------+---------------+---------+-----------+----------+--------------+ PTV      Full                                                        +---------+---------------+---------+-----------+----------+--------------+ PERO     Full                                                        +---------+---------------+---------+-----------+----------+--------------+   +---------+---------------+---------+-----------+----------+--------------+ LEFT      CompressibilityPhasicitySpontaneityPropertiesThrombus Aging +---------+---------------+---------+-----------+----------+--------------+ CFV      Full           Yes      Yes                                 +---------+---------------+---------+-----------+----------+--------------+ SFJ      Full                                                        +---------+---------------+---------+-----------+----------+--------------+ FV Prox  Full           Yes      Yes                                 +---------+---------------+---------+-----------+----------+--------------+ FV Mid   Full           Yes      Yes                                 +---------+---------------+---------+-----------+----------+--------------+ FV DistalFull  Yes      Yes                                 +---------+---------------+---------+-----------+----------+--------------+ PFV      Full                                                        +---------+---------------+---------+-----------+----------+--------------+ POP      Full           Yes      Yes                                 +---------+---------------+---------+-----------+----------+--------------+ PTV      Full                                                        +---------+---------------+---------+-----------+----------+--------------+ PERO     Full                                                        +---------+---------------+---------+-----------+----------+--------------+     Summary: BILATERAL: - No evidence of deep vein thrombosis seen in the lower extremities, bilaterally. - No evidence of superficial venous thrombosis in the lower extremities, bilaterally. -No evidence of popliteal cyst, bilaterally.   *See table(s) above for measurements and observations. Electronically signed by Harold Barban MD on 11/05/2020 at 11:35:57 PM.    Final      ASSESSMENT AND PLAN: This is a very pleasant 80 years old  African-American female recently diagnosed with a stage IIIb (T2a, N3, M0) non-small cell lung cancer, adenocarcinoma presented with left lower lobe lung mass in addition to left hilar and mediastinal lymphadenopathy as well as right and left cervical lymphadenopathy diagnosed in January 2022. The patient underwent a course of concurrent chemoradiation with weekly carboplatin and paclitaxel status post 5 cycles with partial response.   The patient underwent consolidation treatment with Imfinzi 1500 Mg IV every 4 weeks status post 3 cycles.  She has been tolerating this treatment well but during the last infusion she developed systemic inflammatory response syndrome with fever and blood work showed bacteremia secondary to Klebsiella pneumonia treated with antibiotic during her hospitalization. The patient had CT scan of the chest, abdomen pelvis performed during her hospitalization and that showed no clear evidence for disease progression. I discussed the scan results with the patient and her daughter. I recommended for the patient to discontinue her treatment with consolidation immunotherapy at this point because of the recent infection and sepsis. I will continue to monitor her closely and consider The patient for systemic therapy again if she develop any evidence for disease progression. She will come back for follow-up visit in 3 months for evaluation with repeat CT scan of the chest for restaging of her disease. The patient was advised to call immediately if she has any concerning symptoms in the interval.  The patient voices understanding of current disease status and treatment options and is in agreement with the current care plan.  All questions were answered. The patient knows to call the clinic with any problems, questions or concerns. We can certainly see the patient much sooner if necessary.  Disclaimer: This note was dictated with voice recognition software. Similar sounding words can  inadvertently be transcribed and may not be corrected upon review.

## 2020-12-02 ENCOUNTER — Telehealth: Payer: Self-pay | Admitting: *Deleted

## 2020-12-02 NOTE — Chronic Care Management (AMB) (Signed)
  Chronic Care Management   Note  12/02/2020 Name: INETHA MARET MRN: [id number] DOB: 15-Aug-1940  CLARITA MCELVAIN is a 80 y.o. year old female who is a primary care patient of [name], MD. I reached out to [name] by phone today in response to a referral sent by Ms. Arvil Persons Busic's PCP [name], MD     Ms. Profitt was given information about Chronic Care Management services today including:  CCM service includes personalized support from designated clinical staff supervised by her physician, including individualized plan of care and coordination with other care providers 24/7 contact phone numbers for assistance for urgent and routine care needs. Service will only be billed when office clinical staff spend 20 minutes or more in a month to coordinate care. Only one practitioner may furnish and bill the service in a calendar month. The patient may stop CCM services at any time (effective at the end of the month) by phone call to the office staff. The patient will be responsible for cost sharing (co-pay) of up to 20% of the service fee (after annual deductible is met).  Patient agreed to services and verbal consent obtained.   Follow up plan: Telephone appointment with care management team member scheduled for:12/26/2020  Julian Hy, [place] [facility]  Direct Dial: [phone]

## 2020-12-02 NOTE — Chronic Care Management (AMB) (Signed)
  Chronic Care Management   Outreach Note  12/02/2020 Name: MAYU RONK MRN: [id number] DOB: 10-16-1940  BHUMI GODBEY is a 80 y.o. year old female who is a primary care patient of [name], MD. I reached out to [name] by phone today in response to a referral sent by Ms. Arvil Persons Jeane's PCP [name], MD     An unsuccessful telephone outreach was attempted today. The patient was referred to the case management team for assistance with care management and care coordination.   Follow Up Plan: A HIPAA compliant phone message was left for the patient providing contact information and requesting a return call.  If patient returns call to provider office, please advise to call Embedded Care Management Care Guide Kimika Streater at [place], [place] [facility]  Direct Dial: [phone]

## 2020-12-08 ENCOUNTER — Telehealth: Payer: Self-pay | Admitting: Internal Medicine

## 2020-12-08 MED ORDER — AMLODIPINE BESYLATE 5 MG PO TABS: 5.0000 mg | ORAL_TABLET | Freq: Every day | ORAL | 3 refills | Status: DC

## 2020-12-08 NOTE — Telephone Encounter (Signed)
See below

## 2020-12-08 NOTE — Telephone Encounter (Signed)
Daughter calling to report patient has some swelling in feet and ankles since blood pressure medication was changed  Seeking advice, please call

## 2020-12-08 NOTE — Telephone Encounter (Signed)
Ok we need to decrease the amlodipine to 5 mg - I will do this

## 2020-12-12 NOTE — Telephone Encounter (Signed)
Patients daughter called, relayed message below from Dr. Jenny Reichmann, Daughter understood and is updating her mom's medication

## 2020-12-15 ENCOUNTER — Other Ambulatory Visit: Payer: Self-pay | Admitting: Internal Medicine

## 2020-12-15 DIAGNOSIS — Z1231 Encounter for screening mammogram for malignant neoplasm of breast: Secondary | ICD-10-CM

## 2020-12-20 ENCOUNTER — Ambulatory Visit (INDEPENDENT_AMBULATORY_CARE_PROVIDER_SITE_OTHER): Payer: Medicare HMO

## 2020-12-20 ENCOUNTER — Other Ambulatory Visit: Payer: Self-pay

## 2020-12-20 DIAGNOSIS — J4550 Severe persistent asthma, uncomplicated: Secondary | ICD-10-CM | POA: Diagnosis not present

## 2020-12-26 ENCOUNTER — Ambulatory Visit (INDEPENDENT_AMBULATORY_CARE_PROVIDER_SITE_OTHER): Payer: Medicare HMO | Admitting: *Deleted

## 2020-12-26 DIAGNOSIS — I10 Essential (primary) hypertension: Secondary | ICD-10-CM | POA: Diagnosis not present

## 2020-12-26 DIAGNOSIS — C3492 Malignant neoplasm of unspecified part of left bronchus or lung: Secondary | ICD-10-CM | POA: Diagnosis not present

## 2020-12-26 NOTE — Patient Instructions (Signed)
Visit Information   Angelica [name] and Angelica [name] [name], it was nice talking with you today.   I look forward to talking to you again for an update on Wednesday March 15, 2021 at 1:00 pm- please be listening out for my call that day.  I will call as close to 1:00 pm as possible.   If you need to cancel or re-schedule our telephone visit, please call [phone] and one of our care guides will be happy to assist you.   I look forward to hearing about your progress.   Please don't hesitate to contact me if I can be of assistance to you before our next scheduled telephone appointment.   Angelica Rack, RN, BSN, [facility] RN Care Coordination- [place] [phone]: direct office [phone]: mobile   PATIENT GOALS:   Goals Addressed             This Visit's Progress    Patient Self-Care Activities   On track    Timeframe:  Long-Range Goal Priority:  Medium Start Date:      12/26/20                       Expected End Date:       12/26/21  Patient will self administer medications as prescribed Patient will attend all scheduled provider appointments Patient will call pharmacy for medication refills Patient will call provider office for new concerns or questions Patient will monitor and write down on paper weekly blood pressures at home Patient will continue to follow low-salt heart healthy diet                 Urinary Tract Infection, Adult A urinary tract infection (UTI) is an infection of any part of the urinary tract. The urinary tract includes: The kidneys. The ureters. The bladder. The urethra. These organs make, store, and get rid of pee (urine) in the body. What are the causes? This infection is caused by germs (bacteria) in your genital area. These germs grow and cause swelling (inflammation) of your urinary tract. What increases the risk? The following factors may make you more likely to develop this condition: Using a small, thin tube  (catheter) to drain pee. Not being able to control when you pee or poop (incontinence). Being female. If you are female, these things can increase the risk: Using these methods to prevent pregnancy: A medicine that kills sperm (spermicide). A device that blocks sperm (diaphragm). Having low levels of a female hormone (estrogen). Being pregnant. You are more likely to develop this condition if: You have genes that add to your risk. You are sexually active. You take antibiotic medicines. You have trouble peeing because of: A prostate that is bigger than normal, if you are female. A blockage in the part of your body that drains pee from the bladder. A kidney stone. A nerve condition that affects your bladder. Not getting enough to drink. Not peeing often enough. You have other conditions, such as: Diabetes. A weak disease-fighting system (immune system). Sickle cell disease. Gout. Injury of the spine. What are the signs or symptoms? Symptoms of this condition include: Needing to pee right away. Peeing small amounts often. Pain or burning when peeing. Blood in the pee. Pee that smells bad or not like normal. Trouble peeing. Pee that is cloudy. Fluid coming from the vagina, if you are female. Pain in the belly or lower back. Other symptoms include: Vomiting. Not feeling hungry.  Feeling mixed up (confused). This may be the first symptom in older adults. Being tired and grouchy (irritable). A fever. Watery poop (diarrhea). How is this treated? Taking antibiotic medicine. Taking other medicines. Drinking enough water. In some cases, you may need to see a specialist. Follow these instructions at home:  Medicines Take over-the-counter and prescription medicines only as told by your doctor. If you were prescribed an antibiotic medicine, take it as told by your doctor. Do not stop taking it even if you start to feel better. General instructions Make sure you: Pee until your  bladder is empty. Do not hold pee for a long time. Empty your bladder after sex. Wipe from front to back after peeing or pooping if you are a female. Use each tissue one time when you wipe. Drink enough fluid to keep your pee pale yellow. Keep all follow-up visits. Contact a doctor if: You do not get better after 1-2 days. Your symptoms go away and then come back. Get help right away if: You have very bad back pain. You have very bad pain in your lower belly. You have a fever. You have chills. You feeling like you will vomit or you vomit. Summary A urinary tract infection (UTI) is an infection of any part of the urinary tract. This condition is caused by germs in your genital area. There are many risk factors for a UTI. Treatment includes antibiotic medicines. Drink enough fluid to keep your pee pale yellow. This information is not intended to replace advice given to you by your health care provider. Make sure you discuss any questions you have with your healthcare provider. Document Revised: 12/18/2019 Document Reviewed: 12/18/2019 Elsevier Patient Education  2022 [employer].  Consent to CCM Services: Angelica [name] [name] was given information about Chronic Care Management services including:  CCM service includes personalized support from designated clinical staff supervised by her physician, including individualized plan of care and coordination with other care providers 24/7 contact phone numbers for assistance for urgent and routine care needs. Service will only be billed when office clinical staff spend 20 minutes or more in a month to coordinate care. Only one practitioner may furnish and bill the service in a calendar month. The patient may stop CCM services at any time (effective at the end of the month) by phone call to the office staff. The patient will be responsible for cost sharing (co-pay) of up to 20% of the service fee (after annual deductible is met).  Patient agreed to  services and verbal consent obtained.   Patient verbalizes understanding of instructions provided today and agrees to view in [place].   Telephone follow up appointment with care management team member scheduled for:  Wednesday March 15, 2021 at 1:00 pm The patient has been provided with contact information for the care management team and has been advised to call with any health related questions or concerns.   CLINICAL CARE PLAN: Patient Care Plan: RN Care Manager Plan of Care     Problem Identified: Chronic Disease Management Needs   Priority: Medium     Long-Range Goal: Development of plan of care for long term chronic disease management   Start Date: 12/26/2020  Expected End Date: 12/26/2021  Priority: Medium  Note:   Current Barriers:  Chronic Disease Management support and education needs related to HTN and lung cancer  RNCM Clinical Goal(s):  Patient will demonstrate ongoing adherence to prescribed treatment plan for HTN and cancer as evidenced by adherence to prescribed medication  regimen contacting provider for new or worsened symptoms or questions attending all scheduled provider appointments, monitoring blood pressure at home weekly  through collaboration with RN Care manager, provider, and care team.   Interventions: 1:1 collaboration with primary care provider regarding development and update of comprehensive plan of care as evidenced by provider attestation and co-signature Inter-disciplinary care team collaboration (see longitudinal plan of care) Evaluation of current treatment plan related to  self management and patient's adherence to plan as established by provider;  Hypertension: (Status: New goal.) Last practice recorded BP readings:  BP Readings from Last 3 Encounters:  11/30/20 (!) 149/84  11/14/20 138/76  11/08/20 135/72  Most recent eGFR/CrCl: No results found for: EGFR  No components found for: CRCL  Evaluation of current treatment plan related to  hypertension self management and patient's adherence to plan as established by provider;   Reviewed prescribed diet low salt, heart healthy Reviewed medications with patient and discussed importance of compliance;  Discussed plans with patient for ongoing care management follow up and provided patient with direct contact information for care management team; Reviewed scheduled/upcoming provider appointments including:  Advised patient to discuss whether to get flu vaccine for upcoming flu season, in light of immunotherapy treatment (currently on hold) with provider; Screening for signs and symptoms of depression related to chronic disease state;  Assessed social determinant of health barriers;  Advised patient and caregiver to continue monitoring/ recording blood pressures weekly at home Discussed signs/ symptoms UTI, UTI prevention through maintaining hydration after recent hospitalization in June 2022: reviewed June 2022 hospital visit with patient and caregiver  Lung Cancer  (Status: New goal.) Evaluation of current treatment plan related to  lung cancer ,  self-management and patient's adherence to plan as established by provider. Discussed plans with patient for ongoing care management follow up and provided patient with direct contact information for care management team Reviewed medications with patient and discussed adherence: patient adherent to xurrent medication: no concerns or problems identified through medication review today; confirmed immunotherapy treatment through cancer center currently on hold post- June 2022 hospitalization; Reviewed scheduled/upcoming provider appointments including port flush 01/11/21; PCP office visit 01/26/21; 02/02/21 allergy provider; 03/01/21 oncology provider; Discussed plans with patient for ongoing care management follow up and provided patient with direct contact information for care management team;  Patient Goals/Self-Care Activities: Patient will  self administer medications as prescribed Patient will attend all scheduled provider appointments Patient will call pharmacy for medication refills Patient will call provider office for new concerns or questions Patient will monitor and write down on paper weekly blood pressures at home Patient will continue to follow low-salt heart healthy diet

## 2020-12-26 NOTE — Chronic Care Management (AMB) (Signed)
Chronic Care Management   CCM RN Visit Note  12/26/2020 Name: Angelica [name] MRN: [id number] DOB: 09-Apr-1941  Subjective: Angelica [name] is a 80 y.o. year old female who is a primary care patient of [name], MD. The care management team was consulted for assistance with disease management and care coordination needs.    Engaged with patient and caregiver/ daughter [name] on [facility] DPR, by telephone for initial visit in response to provider referral for case management and/or care coordination services.   Consent to Services:  The patient was given information about Chronic Care Management services, agreed to services, and gave verbal consent 12/02/20 prior to initiation of services.  Please see initial visit note for detailed documentation.  Patient agreed to services and verbal consent obtained.   Assessment: Review of patient past medical history, allergies, medications, health status, including review of consultants reports, laboratory and other test data, was performed as part of comprehensive evaluation and provision of chronic care management services.   SDOH (Social Determinants of Health) assessments and interventions performed:  SDOH Interventions    Flowsheet Row Most Recent Value  SDOH Interventions   Food Insecurity Interventions Intervention Not Indicated  Housing Interventions Intervention Not Indicated  Transportation Interventions Intervention Not Indicated       CCM Care Plan  Allergies  Allergen Reactions   Asa Buff (Mag [Buffered Aspirin] Other (See Comments)    Excessive sweating   Ensure [Nutritional Supplements]     Or boost - upset stomach    Fish Allergy Other (See Comments)    Unknown reaction, but allergic   Other Other (See Comments)    Gelcaps; gel-containing capsules; extended-release medication - upset stomach    Peanut-Containing Drug Products Other (See Comments)    From allergy test   Penicillins Itching    Has patient had a PCN reaction  causing immediate rash, facial/tongue/throat swelling, SOB or lightheadedness with hypotension: No Has patient had a PCN reaction causing severe rash involving mucus membranes or skin necrosis: No Has patient had a PCN reaction that required hospitalization No Has patient had a PCN reaction occurring within the last 10 years: No If all of the above answers are "NO", then may proceed with Cephalosporin use.   Shellfish-Derived Products Other (See Comments)    From allergy test   Strawberry Extract Other (See Comments)    From allergy test     Outpatient Encounter Medications as of 12/26/2020  Medication Sig Note   acetaminophen (TYLENOL) 325 MG tablet Take 1 tablet (325 mg total) by mouth every 6 (six) hours as needed for headache or mild pain.    alum & mag hydroxide-simeth (MAALOX/MYLANTA) 200-200-20 MG/5ML suspension Take 15 mLs by mouth every 6 (six) hours as needed for indigestion or heartburn.    amLODipine (NORVASC) 5 MG tablet Take 1 tablet (5 mg total) by mouth daily.    Benralizumab (FASENRA PEN) 30 MG/ML SOAJ Inject into the skin every 8 (eight) weeks.    budesonide-formoterol (SYMBICORT) 80-4.5 MCG/ACT inhaler Inhale 2 puffs into the lungs 2 (two) times daily. Use for asthma flares 2 puffs twice a day for 2 weeks or until cough and wheeze free    Cholecalciferol (VITAMIN D3) 50 MCG (2000 UT) TABS Take 2,000 Units by mouth daily.    EPINEPHrine (EPIPEN 2-PAK) 0.3 mg/0.3 mL IJ SOAJ injection Use as directed for severe allergic reaction    ipratropium (ATROVENT) 0.06 % nasal spray Place 2 sprays into both nostrils 3 (three) times  daily.    linaclotide (LINZESS) 72 MCG capsule TAKE 1 CAPSULE BY MOUTH ONCE DAILY AS NEEDED (Patient taking differently: Take 72 mcg by mouth daily as needed (constipation).) 12/26/2020: 12/26/20- patient reports has not needed in several months- year   loratadine (CLARITIN) 10 MG tablet Take 10 mg by mouth daily.    Magnesium Hydroxide (PHILLIPS MILK OF MAGNESIA  PO) Take 15-30 mLs by mouth daily as needed (for constipation).    montelukast (SINGULAIR) 10 MG tablet Take 1 tablet (10 mg total) by mouth at bedtime.    polyethylene glycol (MIRALAX / GLYCOLAX) 17 g packet Take 17 g by mouth daily as needed for mild constipation.    pravastatin (PRAVACHOL) 80 MG tablet Take 1 tablet (80 mg total) by mouth daily.    prochlorperazine (COMPAZINE) 10 MG tablet Take 1 tablet (10 mg total) by mouth every 6 (six) hours as needed for nausea or vomiting. 12/26/2020: 12/26/20: Patient reports has not needed recently   traMADol (ULTRAM) 50 MG tablet Take 1 tablet (50 mg total) by mouth every 6 (six) hours as needed for moderate pain. 12/26/2020: 12/26/20: Patient reports has not needed recently   vitamin B-12 (CYANOCOBALAMIN) 1000 MCG tablet Take 1,000 mcg by mouth daily.    Durvalumab (IMFINZI IV) Inject 1,500 mg into the vein every 28 (twenty-eight) days. Every 4 weeks (Patient not taking: Reported on 12/26/2020) 11/02/2020: Had reaction today   ondansetron (ZOFRAN) 8 MG tablet Take 1 tablet (8 mg total) by mouth every 8 (eight) hours as needed for nausea or vomiting. (Patient not taking: Reported on 12/26/2020) 11/02/2020: New Rx   Facility-Administered Encounter Medications as of 12/26/2020  Medication   Benralizumab SOSY 30 mg   Patient Active Problem List   Diagnosis Date Noted   Malignant neoplasm of bronchus and lung ([place])    Bacteremia due to Klebsiella pneumoniae 11/03/2020   Fever    Port-A-Cath in place 11/02/2020   SIRS (systemic inflammatory response syndrome) ([place]) 11/02/2020   N&V (nausea and vomiting) 10/20/2020   Intractable nausea and vomiting 10/19/2020   Encounter for antineoplastic immunotherapy 08/31/2020   Adenocarcinoma of left lung, stage 3 ([place]) 06/22/2020   Encounter for antineoplastic chemotherapy 06/22/2020   Lung mass    Adenopathy 06/09/2020   Epigastric abdominal tenderness with rebound tenderness 05/17/2020   Pulmonary nodule 05/17/2020    Iron deficiency 05/03/2020   Dizziness 01/19/2020   Left rotator cuff tear 11/12/2019   Trigger thumb of right hand 11/12/2019   Trigger finger, left ring finger 11/12/2019   Left shoulder pain 09/29/2019   Swelling of lower extremity 07/30/2019   Greater trochanteric bursitis of left hip 04/07/2019   Right ankle swelling 02/18/2019   Ganglion cyst of right foot 01/16/2019   HLD (hyperlipidemia) 01/16/2019   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 pylori infection    Other allergic rhinitis 03/20/2016   Cough, persistent 03/20/2016   Hypoglycemia 04/17/2015   Small bowel obstruction ([place])    Malnutrition of moderate degree 04/11/2015   SBO (small bowel obstruction) ([place]) 04/10/2015   HTN (  hypertension) 04/10/2015   Conditions to be addressed/monitored:  HTN and cancer  Care Plan : RN Care Manager Plan of Care  Updates made by Knox Royalty, RN since 12/26/2020 12:00 AM     Problem: Chronic Disease Management Needs   Priority: Medium     Long-Range Goal: Development of plan of care for long term chronic disease management   Start Date: 12/26/2020  Expected End Date: 12/26/2021  Priority: Medium  Note:   Current Barriers:  Chronic Disease Management support and education needs related to HTN and lung cancer  RNCM Clinical Goal(s):  Patient will demonstrate ongoing adherence to prescribed treatment plan for HTN and cancer as evidenced by adherence to prescribed medication regimen contacting provider for new or  worsened symptoms or questions attending all scheduled provider appointments, monitoring blood pressure at home weekly  through collaboration with RN Care manager, provider, and care team.   Interventions: 1:1 collaboration with primary care provider regarding development and update of comprehensive plan of care as evidenced by provider attestation and co-signature Inter-disciplinary care team collaboration (see longitudinal plan of care) Evaluation of current treatment plan related to  self management and patient's adherence to plan as established by provider;  Hypertension: (Status: New goal.) Last practice recorded BP readings:  BP Readings from Last 3 Encounters:  11/30/20 (!) 149/84  11/14/20 138/76  11/08/20 135/72  Most recent eGFR/CrCl: No results found for: EGFR  No components found for: CRCL  Evaluation of current treatment plan related to hypertension self management and patient's adherence to plan as established by provider;   Reviewed prescribed diet low salt, heart healthy Reviewed medications with patient and discussed importance of compliance;  Discussed plans with patient for ongoing care management follow up and provided patient with direct contact information for care management team; Reviewed scheduled/upcoming provider appointments including:  Advised patient to discuss whether to get flu vaccine for upcoming flu season, in light of immunotherapy treatment (currently on hold) with provider; Screening for signs and symptoms of depression related to chronic disease state;  Assessed social determinant of health barriers;  Advised patient and caregiver to continue monitoring/ recording blood pressures weekly at home Discussed signs/ symptoms UTI, UTI prevention through maintaining hydration after recent hospitalization in June 2022: reviewed June 2022 hospital visit with patient and caregiver  Lung Cancer  (Status: New goal.) Evaluation of current treatment plan related to   lung cancer ,  self-management and patient's adherence to plan as established by provider. Discussed plans with patient for ongoing care management follow up and provided patient with direct contact information for care management team Reviewed medications with patient and discussed adherence: patient adherent to xurrent medication: no concerns or problems identified through medication review today; confirmed immunotherapy treatment through cancer center currently on hold post- June 2022 hospitalization; Reviewed scheduled/upcoming provider appointments including port flush 01/11/21; PCP office visit 01/26/21; 02/02/21 allergy provider; 03/01/21 oncology provider; Discussed plans with patient for ongoing care management follow up and provided patient with direct contact information for care management team;  Patient Goals/Self-Care Activities: Patient will self administer medications as prescribed Patient will attend all scheduled provider appointments Patient will call pharmacy for medication refills Patient will call provider office for new concerns or questions Patient will monitor and write down on paper weekly blood pressures at home Patient will continue to follow low-salt heart healthy diet      Plan: Telephone follow up appointment with care management team member scheduled for:  Wednesday March 15, 2021 at  1:00 pm The patient has been provided with contact information for the care management team and has been advised to call with any health related questions or concerns  [name], RN, BSN, [place] [phone]: direct office [phone]: mobile

## 2020-12-28 ENCOUNTER — Ambulatory Visit: Payer: Medicare HMO | Admitting: Internal Medicine

## 2020-12-28 ENCOUNTER — Ambulatory Visit: Payer: Medicare HMO

## 2020-12-28 ENCOUNTER — Other Ambulatory Visit: Payer: Medicare HMO

## 2020-12-29 ENCOUNTER — Telehealth: Payer: Medicare HMO | Admitting: Emergency Medicine

## 2020-12-29 DIAGNOSIS — J329 Chronic sinusitis, unspecified: Secondary | ICD-10-CM | POA: Diagnosis not present

## 2020-12-29 MED ORDER — DOXYCYCLINE HYCLATE 100 MG PO CAPS: 100.0000 mg | ORAL_CAPSULE | Freq: Two times a day (BID) | ORAL | 0 refills | Status: DC

## 2020-12-29 NOTE — Progress Notes (Signed)
E-Visit for Sinus Problems ° °We are sorry that you are not feeling well.  Here is how we plan to help! ° °Based on what you have shared with me it looks like you have sinusitis.  Sinusitis is inflammation and infection in the sinus cavities of the head.  Based on your presentation I believe you most likely have Acute Bacterial Sinusitis.  This is an infection caused by bacteria and is treated with antibiotics. I have prescribed Doxycycline 100mg by mouth twice a day for 10 days. You may use an oral decongestant such as Mucinex D or if you have glaucoma or high blood pressure use plain Mucinex. Saline nasal spray help and can safely be used as often as needed for congestion.  If you develop worsening sinus pain, fever or notice severe headache and vision changes, or if symptoms are not better after completion of antibiotic, please schedule an appointment with a health care provider.   ° °Sinus infections are not as easily transmitted as other respiratory infection, however we still recommend that you avoid close contact with loved ones, especially the very young and elderly.  Remember to wash your hands thoroughly throughout the day as this is the number one way to prevent the spread of infection! ° °Home Care: °Only take medications as instructed by your medical team. °Complete the entire course of an antibiotic. °Do not take these medications with alcohol. °A steam or ultrasonic humidifier can help congestion.  You can place a towel over your head and breathe in the steam from hot water coming from a faucet. °Avoid close contacts especially the very young and the elderly. °Cover your mouth when you cough or sneeze. °Always remember to wash your hands. ° °Get Help Right Away If: °You develop worsening fever or sinus pain. °You develop a severe head ache or visual changes. °Your symptoms persist after you have completed your treatment plan. ° °Make sure you °Understand these instructions. °Will watch your  condition. °Will get help right away if you are not doing well or get worse. ° °Thank you for choosing an e-visit. ° °Your e-visit answers were reviewed by a board certified advanced clinical practitioner to complete your personal care plan. Depending upon the condition, your plan could have included both over the counter or prescription medications. ° °Please review your pharmacy choice. Make sure the pharmacy is open so you can pick up prescription now. If there is a problem, you may contact your provider through MyChart messaging and have the prescription routed to another pharmacy.  Your safety is important to us. If you have drug allergies check your prescription carefully.  ° °For the next 24 hours you can use MyChart to ask questions about today's visit, request a non-urgent call back, or ask for a work or school excuse. °You will get an email in the next two days asking about your experience. I hope that your e-visit has been valuable and will speed your recovery. ° °Approximately 5 minutes was used in reviewing the patient's chart, questionnaire, prescribing medications, and documentation. ° °

## 2021-01-04 ENCOUNTER — Encounter: Payer: Self-pay | Admitting: Internal Medicine

## 2021-01-04 ENCOUNTER — Ambulatory Visit (INDEPENDENT_AMBULATORY_CARE_PROVIDER_SITE_OTHER): Payer: Medicare HMO | Admitting: Internal Medicine

## 2021-01-04 ENCOUNTER — Other Ambulatory Visit: Payer: Self-pay

## 2021-01-04 VITALS — BP 128/74 | HR 87 | Temp 98.2°F | Ht 64.0 in | Wt 148.6 lb

## 2021-01-04 DIAGNOSIS — J019 Acute sinusitis, unspecified: Secondary | ICD-10-CM | POA: Diagnosis not present

## 2021-01-04 DIAGNOSIS — J4550 Severe persistent asthma, uncomplicated: Secondary | ICD-10-CM | POA: Diagnosis not present

## 2021-01-04 DIAGNOSIS — B9689 Other specified bacterial agents as the cause of diseases classified elsewhere: Secondary | ICD-10-CM | POA: Diagnosis not present

## 2021-01-04 DIAGNOSIS — J310 Chronic rhinitis: Secondary | ICD-10-CM | POA: Diagnosis not present

## 2021-01-04 MED ORDER — FLUTICASONE PROPIONATE 50 MCG/ACT NA SUSP: 2.0000 | Freq: Every day | NASAL | 5 refills | Status: DC

## 2021-01-04 MED ORDER — LEVOFLOXACIN 500 MG PO TABS: 500.0000 mg | ORAL_TABLET | Freq: Every day | ORAL | 0 refills | Status: DC

## 2021-01-04 NOTE — Progress Notes (Signed)
FOLLOW UP Date of Service/Encounter:  01/04/21   Subjective:   Chief Complaint  Patient presents with   Nasal Congestion    atient she has had a stuffy nose that comes and goes for a long period of time this time it has been for over a week. She is on antibiotics as in the past but it is just not working and She has seen and ENT in the past. She wants to know why and make it stop. Cloudy days seem to make it worse. Nasal sprays work temporarily.     Angelica [name] (DOB: [birth year]) is a 80 y.o. female who returns to the [facility] on 01/04/2021 in re-evaluation of the following: new chronic problem of nasal congestion (R > L) History obtained from: chart review and patient and daughter .  Nasal Congestion-has history of allergic rhinitis with polyposis Current therapy: Atrovent 1 spray in right nasal TID, symptoms not improved on this therapy symptoms include: nasal congestion, more on right side.  Has been ongoing for many months, but recently over the past week or so, symptoms are worse than before. She was diagnosed with sinusitis 7 days ago,  and started on doxycycline without improvement.  No fevers, but does have thick yellow mucus and abundant drainage.  Does have facial tightness, but no pain.  No loss of sense of smell/taste.  She denies any recent facial trauma.  Review of CT head on 10/11/20 (patient symptomatic at that time though CT obtained 2/2 dizziness):  Sinuses/Orbits: There is moderate mucosal thickening within the right frontal sinus, several ethmoid air cells and to a lesser degree within the right maxillary sinus. No air-fluid levels. Orbits are unremarkable. Images reviewed by me personally and agree with radiological findings.  Of note, no evidence of polyps on limited sinus views.  Septum midline.   Severe Persistent Asthma: controlled, 0 daytime symptoms in past month, 0 nighttime awakenings in past month, using rescue inhaler none since last  visit Limitations to daily activity: none 0 oral steroids and 1 antibiotics for sinusitis since last visit. Continues on Symbicort, Fasenra, and Singulair nightly.   For Review, LV was on 07/28/20  with Dr. Nelva Bush seen for severe persistent asthma on fasenra q8weeks, LPRD, nasal polyposis, allergic rhinitis, urticaria (resolved).  Of note, she was diagnosed with stage IIIb non-small cell adenocarcinoma found incidentally while hospitalized in 05/2020 for COVID infection and small bowel obstruction.  She is now following with oncology and radiation oncology.  She is currently undergoing chemotherapy treatment and radiation.  However, she was discontinued on her treatment with consolidation immunotherapy due to complication of SIRS and sepsis.  Most recent CT showed no disease progression, so she is being monitored closely.  Additionally on 12/29/20 she was treated for sinusitis with 10 day course of doxycycline.  Allergies as of 01/04/2021       Reactions   Asa Buff (mag [buffered Aspirin] Other (See Comments)   Excessive sweating   Ensure [nutritional Supplements]    Or boost - upset stomach    Fish Allergy Other (See Comments)   Unknown reaction, but allergic   Other Other (See Comments)   Gelcaps; gel-containing capsules; extended-release medication - upset stomach    Peanut-containing Drug Products Other (See Comments)   From allergy test   Penicillins Itching   Has patient had a PCN reaction causing immediate rash, facial/tongue/throat swelling, SOB or lightheadedness with hypotension: No Has patient had a PCN reaction causing severe rash involving  mucus membranes or skin necrosis: No Has patient had a PCN reaction that required hospitalization No Has patient had a PCN reaction occurring within the last 10 years: No If all of the above answers are "NO", then may proceed with Cephalosporin use.   Shellfish-derived Products Other (See Comments)   From allergy test   Strawberry Extract  Other (See Comments)   From allergy test         Medication List        Accurate as of January 04, 2021 12:56 PM. If you have any questions, ask your nurse or doctor.          acetaminophen 325 MG tablet Commonly known as: TYLENOL Take 1 tablet (325 mg total) by mouth every 6 (six) hours as needed for headache or mild pain.   alum & mag hydroxide-simeth 200-200-20 MG/5ML suspension Commonly known as: MAALOX/MYLANTA Take 15 mLs by mouth every 6 (six) hours as needed for indigestion or heartburn.   amLODipine 5 MG tablet Commonly known as: NORVASC Take 1 tablet (5 mg total) by mouth daily.   budesonide-formoterol 80-4.5 MCG/ACT inhaler Commonly known as: Symbicort Inhale 2 puffs into the lungs 2 (two) times daily. Use for asthma flares 2 puffs twice a day for 2 weeks or until cough and wheeze free   doxycycline 100 MG capsule Commonly known as: VIBRAMYCIN Take 1 capsule (100 mg total) by mouth 2 (two) times daily.   EPINEPHrine 0.3 mg/0.3 mL Soaj injection Commonly known as: EpiPen 2-Pak Use as directed for severe allergic reaction   Fasenra Pen 30 MG/ML Soaj Generic drug: Benralizumab Inject into the skin every 8 (eight) weeks.   IMFINZI IV Inject 1,500 mg into the vein every 28 (twenty-eight) days. Every 4 weeks   ipratropium 0.06 % nasal spray Commonly known as: ATROVENT Place 2 sprays into both nostrils 3 (three) times daily.   linaclotide 72 MCG capsule Commonly known as: Linzess TAKE 1 CAPSULE BY MOUTH ONCE DAILY AS NEEDED What changed:  how much to take how to take this when to take this reasons to take this additional instructions   loratadine 10 MG tablet Commonly known as: CLARITIN Take 10 mg by mouth daily.   montelukast 10 MG tablet Commonly known as: SINGULAIR Take 1 tablet (10 mg total) by mouth at bedtime.   ondansetron 8 MG tablet Commonly known as: ZOFRAN Take 1 tablet (8 mg total) by mouth every 8 (eight) hours as needed for nausea  or vomiting.   PHILLIPS MILK OF MAGNESIA PO Take 15-30 mLs by mouth daily as needed (for constipation).   polyethylene glycol 17 g packet Commonly known as: MIRALAX / GLYCOLAX Take 17 g by mouth daily as needed for mild constipation.   pravastatin 80 MG tablet Commonly known as: PRAVACHOL Take 1 tablet (80 mg total) by mouth daily.   prochlorperazine 10 MG tablet Commonly known as: COMPAZINE Take 1 tablet (10 mg total) by mouth every 6 (six) hours as needed for nausea or vomiting.   traMADol 50 MG tablet Commonly known as: ULTRAM Take 1 tablet (50 mg total) by mouth every 6 (six) hours as needed for moderate pain.   vitamin B-12 1000 MCG tablet Commonly known as: CYANOCOBALAMIN Take 1,000 mcg by mouth daily.   Vitamin D3 50 MCG (2000 UT) Tabs Take 2,000 Units by mouth daily.       Past Medical History:  Diagnosis Date   Arthritis    Asthma    Bowel obstruction ([place])  Cancer ([place])    Environmental allergies    GERD (gastroesophageal reflux disease)    Glaucoma 05/09/2017   H. pylori infection    History of radiation therapy 07/05/20-08/05/20   Left lung IMRT Dr. Sondra Come    HLD (hyperlipidemia) 01/16/2019   Hypertension    Iron deficiency anemia    Osteopenia 05/26/2017   Thyroid disease    Urticaria 07/25/2018   Past Surgical History:  Procedure Laterality Date   ABDOMINAL HYSTERECTOMY     BOWEL RESECTION N/A 05/23/2020   Procedure: SMALL BOWEL REPAIR;  Surgeon: Kieth Brightly, Arta Bruce, MD;  Location: [facility];  Service: General;  Laterality: N/A;   BREAST BIOPSY     BRONCHIAL BIOPSY  06/10/2020   Procedure: BRONCHIAL BIOPSIES;  Surgeon: Garner Nash, DO;  Location: [facility];  Service: Pulmonary;;   BRONCHIAL BRUSHINGS  06/10/2020   Procedure: BRONCHIAL BRUSHINGS;  Surgeon: Garner Nash, DO;  Location: [facility];  Service: Pulmonary;;   BRONCHIAL NEEDLE ASPIRATION BIOPSY  06/10/2020   Procedure: BRONCHIAL NEEDLE ASPIRATION BIOPSIES;  Surgeon: Garner Nash, DO;  Location: [facility];  Service: Pulmonary;;   BRONCHIAL WASHINGS  06/10/2020   Procedure: BRONCHIAL WASHINGS;  Surgeon: Garner Nash, DO;  Location: [facility];  Service: Pulmonary;;   COLON SURGERY     DIAGNOSTIC LARYNGOSCOPY N/A 05/23/2020   Procedure: ATTEMPTED DIAGNOSTIC LAPAROSCOPY WITH ADHESIONS;  Surgeon: Kieth Brightly, Arta Bruce, MD;  Location: [facility];  Service: General;  Laterality: N/A;   IR IMAGING GUIDED PORT INSERTION  07/15/2020   KNEE SURGERY     LAPAROTOMY N/A 04/18/2015   Procedure: Exploratory laparotomy with lysis of adhesions, possible bowel resection;  Surgeon: Ralene Ok, MD;  Location: [place] [facility];  Service: General;  Laterality: N/A;   LAPAROTOMY N/A 05/23/2020   Procedure: EXPLORATORY LAPAROTOMY;  Surgeon: Mickeal Skinner, MD;  Location: [facility];  Service: General;  Laterality: N/A;   LYSIS OF ADHESION N/A 05/23/2020   Procedure: LYSIS OF ADHESION;  Surgeon: Kieth Brightly Arta Bruce, MD;  Location: [facility];  Service: General;  Laterality: N/A;   NASAL SINUS SURGERY     THYROID SURGERY     VIDEO BRONCHOSCOPY WITH ENDOBRONCHIAL NAVIGATION Bilateral 06/10/2020   Procedure: VIDEO BRONCHOSCOPY WITH ENDOBRONCHIAL NAVIGATION;  Surgeon: Garner Nash, DO;  Location: [facility];  Service: Pulmonary;  Laterality: Bilateral;   VIDEO BRONCHOSCOPY WITH ENDOBRONCHIAL ULTRASOUND  06/10/2020   Procedure: VIDEO BRONCHOSCOPY WITH ENDOBRONCHIAL ULTRASOUND;  Surgeon: Garner Nash, DO;  Location: [facility];  Service: Pulmonary;;   Otherwise, there have been no changes to her past medical history, surgical history, family history, or social history.  ROS: All others negative except as noted per HPI.   Objective:  BP 128/74 (BP Location: Left Arm, Patient Position: Sitting, Cuff Size: Large)   Pulse 87   Temp 98.2 F (36.8 C) (Temporal)   Ht 5\' 4"  (1.626 m)   Wt 148 lb 9.6 oz (67.4 kg)   SpO2 98%   BMI 25.51 kg/m  Body mass index is 25.51 kg/m. Physical  Exam: General Appearance:  Alert, cooperative, no distress, appears stated age  Head:  Normocephalic, without obvious abnormality, atraumatic  Eyes:  Conjunctiva clear, EOM's intact  Nose: Nares normal, bilateral hypertrophic turbinates (R>L) with thick whitish mucus in R nasal cavity, no visible polyps  Ears Normal bilateral EACs, normal Tms bilaterally, no fluid levels  Throat: Lips, tongue normal; teeth and gums normal, + posterior drainage, no tonsillar exudates  Neck: Supple, symmetrical  Lungs:   CTAB, no wheezing, Respirations unlabored, no coughing  Heart:  RRR, no murmur, Appears well perfused  Extremities: No edema  Skin: Skin color, texture, turgor normal, no rashes or lesions on visualized portions of skin  Neurologic: No gross deficits   Reviewed: previous allergy notes, CT imaging (see HPI)  Assessment:  Acute bacterial sinusitis  Severe persistent asthma without complication  Chronic rhinitis  Plan/Recommendations:   Patient Instructions  Right Nasal Congestion with Thick Drainage-suspect acute bacterial sinusitis: - stop doxycycline - start Levaquin 500 mg (1 tablet) daily with morning meal for 10 days - take probiotics or eat cultured yogurt while on antibiotics  - start Xhance (samples provided) 2 sprays in each nostril daily until symptoms resolve and can continue using for nasal congestion if needed - continue Atrovent (ipatropium) 1 spray up to three times daily for drainage  For asthma:  - continue your Singulair, Symbicort and Fasenra as prescribed  We look forward to seeing you in follow-up!  Sigurd Sos, MD  [facility]

## 2021-01-04 NOTE — Patient Instructions (Addendum)
Right Nasal Congestion with Thick Drainage-suspect acute bacterial sinusitis: - stop doxycycline - start Levaquin 500 mg (1 tablet) daily with morning meal for 10 days - take probiotics or eat cultured yogurt while on antibiotics  - start Xhance (samples provided) 2 sprays in each nostril daily until symptoms resolve and can continue using for nasal congestion if needed - continue Atrovent (ipatropium) 1 spray up to three times daily for drainage  For asthma:  - continue your Singulair, Symbicort and Fasenra as prescribed  We look forward to seeing you in follow-up!

## 2021-01-05 ENCOUNTER — Telehealth: Payer: Self-pay | Admitting: Lab

## 2021-01-05 ENCOUNTER — Telehealth: Payer: Self-pay | Admitting: Internal Medicine

## 2021-01-05 MED ORDER — AMOXICILLIN-POT CLAVULANATE 875-125 MG PO TABS: 1.0000 | ORAL_TABLET | Freq: Two times a day (BID) | ORAL | 0 refills | Status: DC

## 2021-01-05 NOTE — Telephone Encounter (Signed)
Pt's daughter called requesting for patient's levofloxacin to be switched to something else. Patient has only had one dose with was this morning, she took it with a full breakfast. Patient expressed to daughter that the medication is causing her stomach issues. Patient had surgery in Jan 22 on stomach and is very sensitive now. Daughter is requesting something different be called in. Pt's daughter is requesting a call if a different medication is called in, [phone].

## 2021-01-05 NOTE — Telephone Encounter (Signed)
Informed pts daughter of the change and sent in rx to pts pharmacy

## 2021-01-05 NOTE — Addendum Note (Signed)
Addended by: Felipa Emory on: 01/05/2021 10:06 AM   Modules accepted: Orders

## 2021-01-05 NOTE — Telephone Encounter (Signed)
Please advise to change in the levofloxacin

## 2021-01-05 NOTE — Chronic Care Management (AMB) (Signed)
  Chronic Care Management   Outreach Note  01/05/2021 Name: [name] MRN: [id number] DOB: July 10, 1940  Referred by: [name], MD Reason for referral : Medication Management   An unsuccessful telephone outreach was attempted today. The patient was referred to the pharmacist for assistance with care management and care coordination.   Follow Up Plan:   [place]

## 2021-01-10 NOTE — Chronic Care Management (AMB) (Signed)
error 

## 2021-01-11 ENCOUNTER — Inpatient Hospital Stay: Payer: Medicare HMO | Attending: Internal Medicine

## 2021-01-11 ENCOUNTER — Other Ambulatory Visit: Payer: Self-pay

## 2021-01-11 DIAGNOSIS — Z923 Personal history of irradiation: Secondary | ICD-10-CM | POA: Diagnosis not present

## 2021-01-11 DIAGNOSIS — C3432 Malignant neoplasm of lower lobe, left bronchus or lung: Secondary | ICD-10-CM | POA: Insufficient documentation

## 2021-01-11 DIAGNOSIS — Z9221 Personal history of antineoplastic chemotherapy: Secondary | ICD-10-CM | POA: Diagnosis not present

## 2021-01-11 DIAGNOSIS — Z452 Encounter for adjustment and management of vascular access device: Secondary | ICD-10-CM | POA: Diagnosis not present

## 2021-01-11 DIAGNOSIS — Z95828 Presence of other vascular implants and grafts: Secondary | ICD-10-CM

## 2021-01-11 MED ORDER — HEPARIN SOD (PORK) LOCK FLUSH 100 UNIT/ML IV SOLN
500.0000 [IU] | Freq: Once | INTRAVENOUS | Status: AC
  Administered 2021-01-11: 500 [IU]

## 2021-01-11 MED ORDER — SODIUM CHLORIDE 0.9% FLUSH
10.0000 mL | Freq: Once | INTRAVENOUS | Status: AC
  Administered 2021-01-11: 10 mL

## 2021-01-12 ENCOUNTER — Telehealth: Payer: Self-pay | Admitting: Allergy

## 2021-01-12 NOTE — Telephone Encounter (Signed)
Patient daughter called and said that her mother seen dr Simona Huh last week and she is still have problems with her stuff nose. [facility]  [address] [phone].

## 2021-01-25 ENCOUNTER — Encounter: Payer: Self-pay | Admitting: Emergency Medicine

## 2021-01-25 ENCOUNTER — Other Ambulatory Visit: Payer: Self-pay

## 2021-01-25 ENCOUNTER — Ambulatory Visit (INDEPENDENT_AMBULATORY_CARE_PROVIDER_SITE_OTHER): Payer: Medicare HMO | Admitting: Emergency Medicine

## 2021-01-25 VITALS — BP 128/80 | HR 83 | Temp 98.5°F | Ht 64.0 in | Wt 150.0 lb

## 2021-01-25 DIAGNOSIS — J324 Chronic pansinusitis: Secondary | ICD-10-CM | POA: Diagnosis not present

## 2021-01-25 DIAGNOSIS — J339 Nasal polyp, unspecified: Secondary | ICD-10-CM | POA: Diagnosis not present

## 2021-01-25 NOTE — Telephone Encounter (Signed)
Pt is doing all nasal sprays and nasal saline, no relief and called daughter crying at work to come help her I mentioned doing the nasal saline a couple times a day to help loosen everything up and she will try that but also wants to know what else she can try

## 2021-01-25 NOTE — Progress Notes (Signed)
Angelica [name] 80 y.o.   Chief Complaint  Patient presents with   Nasal Congestion    HISTORY OF PRESENT ILLNESS: This is a 80 y.o. female with history of chronic pansinusitis complaining of persistent nasal congestion. Most recent asthma and allergy office visit notes, assessment and plan as follows: Assessment:  Acute bacterial sinusitis   Severe persistent asthma without complication   Chronic rhinitis   Plan/Recommendations:    Patient Instructions  Right Nasal Congestion with Thick Drainage-suspect acute bacterial sinusitis: - stop doxycycline - start Levaquin 500 mg (1 tablet) daily with morning meal for 10 days - take probiotics or eat cultured yogurt while on antibiotics  - start Xhance (samples provided) 2 sprays in each nostril daily until symptoms resolve and can continue using for nasal congestion if needed - continue Atrovent (ipatropium) 1 spray up to three times daily for drainage   For asthma:  - continue your Singulair, Symbicort and Fasenra as prescribed   We look forward to seeing you in follow-up!   Sigurd Sos, MD  [facility] [place]    HPI   Prior to Admission medications   Medication Sig Start Date End Date Taking? Authorizing Provider  acetaminophen (TYLENOL) 325 MG tablet Take 1 tablet (325 mg total) by mouth every 6 (six) hours as needed for headache or mild pain. 10/22/20   Hongalgi, Lenis Dickinson, MD  alum & mag hydroxide-simeth (MAALOX/MYLANTA) 200-200-20 MG/5ML suspension Take 15 mLs by mouth every 6 (six) hours as needed for indigestion or heartburn.    [provider]  amLODipine (NORVASC) 5 MG tablet Take 1 tablet (5 mg total) by mouth daily. 12/08/20 12/08/21  [name], MD  amoxicillin-clavulanate (AUGMENTIN) 875-125 MG tablet Take 1 tablet by mouth 2 (two) times daily. 01/05/21   Sigurd Sos, MD  Benralizumab [facility] PEN) 30 MG/ML SOAJ Inject into the skin every 8 (eight) weeks.    [provider]  budesonide-formoterol (SYMBICORT) 80-4.5 MCG/ACT inhaler Inhale 2 puffs into the lungs 2 (two) times daily. Use for asthma flares 2 puffs twice a day for 2 weeks or until cough and wheeze free 01/28/20   Kennith Gain, MD  Cholecalciferol (VITAMIN D3) 50 MCG (2000 UT) TABS Take 2,000 Units by mouth daily.    [provider]  doxycycline (VIBRAMYCIN) 100 MG capsule Take 1 capsule (100 mg total) by mouth 2 (two) times daily. 12/29/20   Montine Circle, PA-C  Durvalumab (IMFINZI IV) Inject 1,500 mg into the vein every 28 (twenty-eight) days. Every 4 weeks Patient not taking: No sig reported    Curt Bears, MD  EPINEPHrine (EPIPEN 2-PAK) 0.3 mg/0.3 mL IJ SOAJ injection Use as directed for severe allergic reaction 01/28/20   Kennith Gain, MD  fluticasone [facility]) 50 MCG/ACT nasal spray Place 2 sprays into both nostrils daily. 01/04/21   Sigurd Sos, MD  ipratropium (ATROVENT) 0.06 % nasal spray Place 2 sprays into both nostrils 3 (three) times daily. 07/28/20   Kennith Gain, MD  levofloxacin (LEVAQUIN) 500 MG tablet Take 1 tablet (500 mg total) by mouth daily. 01/04/21   Sigurd Sos, MD  linaclotide [facility]) 72 MCG capsule TAKE 1 CAPSULE BY MOUTH ONCE DAILY AS NEEDED Patient taking differently: Take 72 mcg by mouth daily as needed (constipation). 06/14/20   [name], MD  loratadine (CLARITIN) 10 MG tablet Take 10 mg by mouth daily.    [provider]  Magnesium Hydroxide (PHILLIPS MILK OF MAGNESIA PO)  Take 15-30 mLs by mouth daily as needed (for constipation).    [provider]  montelukast (SINGULAIR) 10 MG tablet Take 1 tablet (10 mg total) by mouth at bedtime. 08/15/20   Kennith Gain, MD  ondansetron (ZOFRAN) 8 MG tablet Take 1 tablet (8 mg total) by mouth every 8 (eight) hours as needed for nausea or vomiting. 11/02/20   Curt Bears, MD  polyethylene glycol (MIRALAX / GLYCOLAX) 17 g packet Take 17 g by mouth  daily as needed for mild constipation.    [provider]  pravastatin (PRAVACHOL) 80 MG tablet Take 1 tablet (80 mg total) by mouth daily. 06/14/20   [name], MD  prochlorperazine (COMPAZINE) 10 MG tablet Take 1 tablet (10 mg total) by mouth every 6 (six) hours as needed for nausea or vomiting. 06/22/20   Curt Bears, MD  traMADol (ULTRAM) 50 MG tablet Take 1 tablet (50 mg total) by mouth every 6 (six) hours as needed for moderate pain. 08/01/20   [name], MD  vitamin B-12 (CYANOCOBALAMIN) 1000 MCG tablet Take 1,000 mcg by mouth daily.    [provider]    Allergies  Allergen Reactions   Asa Buff (Mag [Buffered Aspirin] Other (See Comments)    Excessive sweating   Ensure [Nutritional Supplements]     Or boost - upset stomach    Fish Allergy Other (See Comments)    Unknown reaction, but allergic   Other Other (See Comments)    Gelcaps; gel-containing capsules; extended-release medication - upset stomach    Peanut-Containing Drug Products Other (See Comments)    From allergy test   Penicillins Itching    Has patient had a PCN reaction causing immediate rash, facial/tongue/throat swelling, SOB or lightheadedness with hypotension: No Has patient had a PCN reaction causing severe rash involving mucus membranes or skin necrosis: No Has patient had a PCN reaction that required hospitalization No Has patient had a PCN reaction occurring within the last 10 years: No If all of the above answers are "NO", then may proceed with Cephalosporin use.   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 pylori infection    Other allergic rhinitis 03/20/2016   Malnutrition of moderate degree 04/11/2015   HTN (hypertension) 04/10/2015    Past Medical History:  Diagnosis Date   Arthritis    Asthma    Bowel obstruction (HCC)    Cancer ([place])    Environmental allergies    GERD (gastroesophageal reflux disease)    Glaucoma 05/09/2017   H. pylori infection    History of radiation therapy 07/05/20-08/05/20   Left lung IMRT Dr. Sondra Come    HLD (hyperlipidemia) 01/16/2019   Hypertension  Iron deficiency anemia    Osteopenia 05/26/2017   Thyroid disease    Urticaria 07/25/2018    Past Surgical History:  Procedure Laterality Date   ABDOMINAL HYSTERECTOMY     BOWEL RESECTION N/A 05/23/2020   Procedure: SMALL BOWEL REPAIR;  Surgeon: Kieth Brightly, Arta Bruce, MD;  Location: [place];  Service: General;  Laterality: N/A;   BREAST BIOPSY     BRONCHIAL BIOPSY  06/10/2020   Procedure: BRONCHIAL BIOPSIES;  Surgeon: Garner Nash, DO;  Location: [place] [facility];  Service: Pulmonary;;   BRONCHIAL BRUSHINGS  06/10/2020   Procedure: BRONCHIAL BRUSHINGS;  Surgeon: Garner Nash, DO;  Location: [place] [facility];  Service: Pulmonary;;   BRONCHIAL NEEDLE ASPIRATION BIOPSY  06/10/2020   Procedure: BRONCHIAL NEEDLE ASPIRATION BIOPSIES;  Surgeon: Garner Nash, DO;  Location: [place] [facility];  Service: Pulmonary;;   BRONCHIAL WASHINGS  06/10/2020   Procedure: BRONCHIAL WASHINGS;  Surgeon: Garner Nash, DO;  Location: [place] [facility];  Service: Pulmonary;;   COLON SURGERY     DIAGNOSTIC LARYNGOSCOPY N/A 05/23/2020   Procedure:  ATTEMPTED DIAGNOSTIC LAPAROSCOPY WITH ADHESIONS;  Surgeon: Kieth Brightly, Arta Bruce, MD;  Location: [place];  Service: General;  Laterality: N/A;   IR IMAGING GUIDED PORT INSERTION  07/15/2020   KNEE SURGERY     LAPAROTOMY N/A 04/18/2015   Procedure: Exploratory laparotomy with lysis of adhesions, possible bowel resection;  Surgeon: Ralene Ok, MD;  Location: [place];  Service: General;  Laterality: N/A;   LAPAROTOMY N/A 05/23/2020   Procedure: EXPLORATORY LAPAROTOMY;  Surgeon: Mickeal Skinner, MD;  Location: [place];  Service: General;  Laterality: N/A;   LYSIS OF ADHESION N/A 05/23/2020   Procedure: LYSIS OF ADHESION;  Surgeon: Kieth Brightly Arta Bruce, MD;  Location: [place];  Service: General;  Laterality: N/A;   NASAL SINUS SURGERY     THYROID SURGERY     VIDEO BRONCHOSCOPY WITH ENDOBRONCHIAL NAVIGATION Bilateral 06/10/2020   Procedure: VIDEO BRONCHOSCOPY WITH ENDOBRONCHIAL NAVIGATION;  Surgeon: Garner Nash, DO;  Location: [place];  Service: Pulmonary;  Laterality: Bilateral;   VIDEO BRONCHOSCOPY WITH ENDOBRONCHIAL ULTRASOUND  06/10/2020   Procedure: VIDEO BRONCHOSCOPY WITH ENDOBRONCHIAL ULTRASOUND;  Surgeon: Garner Nash, DO;  Location: [place] [facility];  Service: Pulmonary;;    Social History   Socioeconomic History   Marital status: Widowed    Spouse name: Not on file   Number of children: 4   Years of education: Not on file   Highest education level: Not on file  Occupational History   Occupation: retired  Tobacco Use   Smoking status: Former    Types: Cigarettes   Smokeless tobacco: Never   Tobacco comments:    quit 60 years ago  Vaping Use   Vaping Use: Never used  Substance and Sexual Activity   Alcohol use: No   Drug use: No   Sexual activity: Never  Other Topics Concern   Not on file  Social History Narrative   Not on file   Social Determinants of Health   Financial Resource Strain: Not on file  Food Insecurity: No Food Insecurity   Worried About Paediatric nurse in the Last Year: Never true   [place] in the Last Year: Never true  Transportation Needs: No Transportation Needs   Lack of Transportation (Medical): No   Lack of Transportation (Non-Medical): No  Physical Activity: Not on file  Stress: Not on file  Social Connections: Not on file  Intimate Partner Violence: Not on file  Family History  Problem Relation Age of Onset   Diabetes Mother    Hypertension Mother    Hypertension Father    Glaucoma Sister    Glaucoma Brother    Allergic rhinitis Neg Hx    Angioedema Neg Hx    Asthma Neg Hx    Eczema Neg Hx    Immunodeficiency Neg Hx    Urticaria Neg Hx      Review of Systems  Constitutional: Negative.  Negative for chills and fever.  HENT:  Positive for congestion and sinus pain.   Respiratory: Negative.  Negative for cough and shortness of breath.   Cardiovascular:  Negative for chest pain and palpitations.  Gastrointestinal:  Negative for abdominal pain, nausea and vomiting.  Skin: Negative.  Negative for rash.  Neurological:  Negative for dizziness and headaches.  All other systems reviewed and are negative.   Physical Exam Vitals reviewed.  Constitutional:      Appearance: Normal appearance.  HENT:     Head: Normocephalic.     Right Ear: Tympanic membrane, ear canal and external ear normal.     Left Ear: Tympanic membrane, ear canal and external ear normal.     Nose: Congestion present.     Mouth/Throat:     Mouth: Mucous membranes are moist.     Pharynx: Oropharynx is clear.  Eyes:     Extraocular Movements: Extraocular movements intact.     Conjunctiva/sclera: Conjunctivae normal.     Pupils: Pupils are equal, round, and reactive to light.  Cardiovascular:     Rate and Rhythm: Normal rate.  Pulmonary:     Effort: Pulmonary effort is normal.  Musculoskeletal:        General: Normal range of motion.     Cervical back: Normal range of motion. No tenderness.  Lymphadenopathy:      Cervical: No cervical adenopathy.  Skin:    General: Skin is warm and dry.     Capillary Refill: Capillary refill takes less than 2 seconds.  Neurological:     General: No focal deficit present.     Mental Status: She is alert and oriented to person, place, and time.  Psychiatric:        Mood and Affect: Mood normal.        Behavior: Behavior normal.     ASSESSMENT & PLAN: Clinically stable.  No red flag signs or symptoms.  Chronic sinusitis with symptoms persistent despite maximal medical therapy and antibiotics. Needs to follow-up with ENT for further evaluation and management.  Angelica [name] was seen today for nasal congestion.  Diagnoses and all orders for this visit:  Chronic pansinusitis -     Ambulatory referral to ENT  Nasal polyposis -     Ambulatory referral to ENT  Patient Instructions  Continue present treatment.  Use saline nasal spray as frequently as needed. Try DayQuil instead of Mucinex D. Follow-up with ENT doctor. Sinusitis, Adult Sinusitis is soreness and swelling (inflammation) of your sinuses. Sinuses are hollow spaces in the bones around your face. They are located: Around your eyes. In the middle of your forehead. Behind your nose. In your cheekbones. Your sinuses and nasal passages are lined with a fluid called mucus. Mucus drains out of your sinuses. Swelling can trap mucus in your sinuses. This lets germs (bacteria, virus, or fungus) grow, which leads to infection. Most of the time, this condition is caused by a virus. What are the causes? This condition is caused by: Allergies. Asthma. Germs. Things  that block your nose or sinuses. Growths in the nose (nasal polyps). Chemicals or irritants in the air. Fungus (rare). What increases the risk? You are more likely to develop this condition if: You have a weak body defense system (immune system). You do a lot of swimming or diving. You use nasal sprays too much. You smoke. What are the signs or  symptoms? The main symptoms of this condition are pain and a feeling of pressure around the sinuses. Other symptoms include: Stuffy nose (congestion). Runny nose (drainage). Swelling and warmth in the sinuses. Headache. Toothache. A cough that may get worse at night. Mucus that collects in the throat or the back of the nose (postnasal drip). Being unable to smell and taste. Being very tired (fatigue). A fever. Sore throat. Bad breath. How is this diagnosed? This condition is diagnosed based on: Your symptoms. Your medical history. A physical exam. Tests to find out if your condition is short-term (acute) or long-term (chronic). Your doctor may: Check your nose for growths (polyps). Check your sinuses using a tool that has a light (endoscope). Check for allergies or germs. Do imaging tests, such as an MRI or CT scan. How is this treated? Treatment for this condition depends on the cause and whether it is short-term or long-term. If caused by a virus, your symptoms should go away on their own within 10 days. You may be given medicines to relieve symptoms. They include: Medicines that shrink swollen tissue in the nose. Medicines that treat allergies (antihistamines). A spray that treats swelling of the nostrils.  Rinses that help get rid of thick mucus in your nose (nasal saline washes). If caused by bacteria, your doctor may wait to see if you will get better without treatment. You may be given antibiotic medicine if you have: A very bad infection. A weak body defense system. If caused by growths in the nose, you may need to have surgery. Follow these instructions at home: Medicines Take, use, or apply over-the-counter and prescription medicines only as told by your doctor. These may include nasal sprays. If you were prescribed an antibiotic medicine, take it as told by your doctor. Do not stop taking the antibiotic even if you start to feel better. Hydrate and humidify  Drink  enough water to keep your pee (urine) pale yellow. Use a cool mist humidifier to keep the humidity level in your home above 50%. Breathe in steam for 10-15 minutes, 3-4 times a day, or as told by your doctor. You can do this in the bathroom while a hot shower is running. Try not to spend time in cool or dry air. Rest Rest as much as you can. Sleep with your head raised (elevated). Make sure you get enough sleep each night. General instructions  Put a warm, moist washcloth on your face 3-4 times a day, or as often as told by your doctor. This will help with discomfort. Wash your hands often with soap and water. If there is no soap and water, use hand sanitizer. Do not smoke. Avoid being around people who are smoking (secondhand smoke). Keep all follow-up visits as told by your doctor. This is important. Contact a doctor if: You have a fever. Your symptoms get worse. Your symptoms do not get better within 10 days. Get help right away if: You have a very bad headache. You cannot stop throwing up (vomiting). You have very bad pain or swelling around your face or eyes. You have trouble seeing. You feel confused.  Your neck is stiff. You have trouble breathing. Summary Sinusitis is swelling of your sinuses. Sinuses are hollow spaces in the bones around your face. This condition is caused by tissues in your nose that become inflamed or swollen. This traps germs. These can lead to infection. If you were prescribed an antibiotic medicine, take it as told by your doctor. Do not stop taking it even if you start to feel better. Keep all follow-up visits as told by your doctor. This is important. This information is not intended to replace advice given to you by your health care provider. Make sure you discuss any questions you have with your health care provider. Document Revised: 10/07/2017 Document Reviewed: 10/07/2017 Elsevier Patient Education  2022 [place],  MD [facility]

## 2021-01-25 NOTE — Telephone Encounter (Signed)
Patient's daughter, Maayan Jenning called and said her mother, Angelica [name], saw Dr. Simona Huh on 01/04/21 and was prescribed an antibiotic and Xhance. She said she still has a stuffy nose. Is there another treatment she can do?

## 2021-01-25 NOTE — Patient Instructions (Signed)
Continue present treatment.  Use saline nasal spray as frequently as needed. Try DayQuil instead of Mucinex D. Follow-up with ENT doctor. Sinusitis, Adult Sinusitis is soreness and swelling (inflammation) of your sinuses. Sinuses are hollow spaces in the bones around your face. They are located: Around your eyes. In the middle of your forehead. Behind your nose. In your cheekbones. Your sinuses and nasal passages are lined with a fluid called mucus. Mucus drains out of your sinuses. Swelling can trap mucus in your sinuses. This lets germs (bacteria, virus, or fungus) grow, which leads to infection. Most of the time, this condition is caused by a virus. What are the causes? This condition is caused by: Allergies. Asthma. Germs. Things that block your nose or sinuses. Growths in the nose (nasal polyps). Chemicals or irritants in the air. Fungus (rare). What increases the risk? You are more likely to develop this condition if: You have a weak body defense system (immune system). You do a lot of swimming or diving. You use nasal sprays too much. You smoke. What are the signs or symptoms? The main symptoms of this condition are pain and a feeling of pressure around the sinuses. Other symptoms include: Stuffy nose (congestion). Runny nose (drainage). Swelling and warmth in the sinuses. Headache. Toothache. A cough that may get worse at night. Mucus that collects in the throat or the back of the nose (postnasal drip). Being unable to smell and taste. Being very tired (fatigue). A fever. Sore throat. Bad breath. How is this diagnosed? This condition is diagnosed based on: Your symptoms. Your medical history. A physical exam. Tests to find out if your condition is short-term (acute) or long-term (chronic). Your doctor may: Check your nose for growths (polyps). Check your sinuses using a tool that has a light (endoscope). Check for allergies or germs. Do imaging tests, such as  an MRI or CT scan. How is this treated? Treatment for this condition depends on the cause and whether it is short-term or long-term. If caused by a virus, your symptoms should go away on their own within 10 days. You may be given medicines to relieve symptoms. They include: Medicines that shrink swollen tissue in the nose. Medicines that treat allergies (antihistamines). A spray that treats swelling of the nostrils.  Rinses that help get rid of thick mucus in your nose (nasal saline washes). If caused by bacteria, your doctor may wait to see if you will get better without treatment. You may be given antibiotic medicine if you have: A very bad infection. A weak body defense system. If caused by growths in the nose, you may need to have surgery. Follow these instructions at home: Medicines Take, use, or apply over-the-counter and prescription medicines only as told by your doctor. These may include nasal sprays. If you were prescribed an antibiotic medicine, take it as told by your doctor. Do not stop taking the antibiotic even if you start to feel better. Hydrate and humidify  Drink enough water to keep your pee (urine) pale yellow. Use a cool mist humidifier to keep the humidity level in your home above 50%. Breathe in steam for 10-15 minutes, 3-4 times a day, or as told by your doctor. You can do this in the bathroom while a hot shower is running. Try not to spend time in cool or dry air. Rest Rest as much as you can. Sleep with your head raised (elevated). Make sure you get enough sleep each night. General instructions  Put a warm,  moist washcloth on your face 3-4 times a day, or as often as told by your doctor. This will help with discomfort. Wash your hands often with soap and water. If there is no soap and water, use hand sanitizer. Do not smoke. Avoid being around people who are smoking (secondhand smoke). Keep all follow-up visits as told by your doctor. This is  important. Contact a doctor if: You have a fever. Your symptoms get worse. Your symptoms do not get better within 10 days. Get help right away if: You have a very bad headache. You cannot stop throwing up (vomiting). You have very bad pain or swelling around your face or eyes. You have trouble seeing. You feel confused. Your neck is stiff. You have trouble breathing. Summary Sinusitis is swelling of your sinuses. Sinuses are hollow spaces in the bones around your face. This condition is caused by tissues in your nose that become inflamed or swollen. This traps germs. These can lead to infection. If you were prescribed an antibiotic medicine, take it as told by your doctor. Do not stop taking it even if you start to feel better. Keep all follow-up visits as told by your doctor. This is important. This information is not intended to replace advice given to you by your health care provider. Make sure you discuss any questions you have with your health care provider. Document Revised: 10/07/2017 Document Reviewed: 10/07/2017 Elsevier Patient Education  2022 [employer].

## 2021-01-26 ENCOUNTER — Ambulatory Visit: Payer: Medicare HMO | Admitting: Internal Medicine

## 2021-01-26 NOTE — Telephone Encounter (Signed)
Lm for pt to call us back  

## 2021-01-28 ENCOUNTER — Other Ambulatory Visit: Payer: Self-pay

## 2021-01-28 ENCOUNTER — Encounter (HOSPITAL_COMMUNITY): Payer: Self-pay | Admitting: Emergency Medicine

## 2021-01-28 ENCOUNTER — Emergency Department (HOSPITAL_COMMUNITY)
Admission: EM | Admit: 2021-01-28 | Discharge: 2021-01-29 | Disposition: A | Discharge status: Home / Self Care | Payer: Medicare HMO | Attending: Emergency Medicine | Admitting: Emergency Medicine

## 2021-01-28 ENCOUNTER — Emergency Department (HOSPITAL_COMMUNITY): Payer: Medicare HMO

## 2021-01-28 DIAGNOSIS — I10 Essential (primary) hypertension: Secondary | ICD-10-CM | POA: Diagnosis not present

## 2021-01-28 DIAGNOSIS — R002 Palpitations: Secondary | ICD-10-CM | POA: Insufficient documentation

## 2021-01-28 DIAGNOSIS — Z5321 Procedure and treatment not carried out due to patient leaving prior to being seen by health care provider: Secondary | ICD-10-CM | POA: Insufficient documentation

## 2021-01-28 DIAGNOSIS — R079 Chest pain, unspecified: Secondary | ICD-10-CM | POA: Diagnosis not present

## 2021-01-28 LAB — COMPREHENSIVE METABOLIC PANEL WITH GFR
ALT: 15 U/L (ref 0–44)
AST: 23 U/L (ref 15–41)
Albumin: 3.8 g/dL (ref 3.5–5.0)
Alkaline Phosphatase: 69 U/L (ref 38–126)
Anion gap: 12 (ref 5–15)
BUN: 9 mg/dL (ref 8–23)
CO2: 19 mmol/L — ABNORMAL LOW (ref 22–32)
Calcium: 9.6 mg/dL (ref 8.9–10.3)
Chloride: 106 mmol/L (ref 98–111)
Creatinine, Ser: 0.74 mg/dL (ref 0.44–1.00)
GFR, Estimated: >60 mL/min
Glucose, Bld: 111 mg/dL — ABNORMAL HIGH (ref 70–99)
Potassium: 3.4 mmol/L — ABNORMAL LOW (ref 3.5–5.1)
Sodium: 137 mmol/L (ref 135–145)
Total Bilirubin: 0.6 mg/dL (ref 0.3–1.2)
Total Protein: 7.6 g/dL (ref 6.5–8.1)

## 2021-01-28 LAB — CBC WITH DIFFERENTIAL/PLATELET
Abs Immature Granulocytes: 0.02 10*3/uL (ref 0.00–0.07)
Basophils Absolute: 0 10*3/uL (ref 0.0–0.1)
Basophils Relative: 1 %
Eosinophils Absolute: 0 10*3/uL (ref 0.0–0.5)
Eosinophils Relative: 0 %
HCT: 40.7 % (ref 36.0–46.0)
Hemoglobin: 13.6 g/dL (ref 12.0–15.0)
Immature Granulocytes: 0 %
Lymphocytes Relative: 19 %
Lymphs Abs: 0.9 10*3/uL (ref 0.7–4.0)
MCH: 29.8 pg (ref 26.0–34.0)
MCHC: 33.4 g/dL (ref 30.0–36.0)
MCV: 89.1 fL (ref 80.0–100.0)
Monocytes Absolute: 0.4 10*3/uL (ref 0.1–1.0)
Monocytes Relative: 8 %
Neutro Abs: 3.7 10*3/uL (ref 1.7–7.7)
Neutrophils Relative %: 72 %
Platelets: 222 10*3/uL (ref 150–400)
RBC: 4.57 MIL/uL (ref 3.87–5.11)
RDW: 13.8 % (ref 11.5–15.5)
WBC: 5.1 10*3/uL (ref 4.0–10.5)
nRBC: 0 % (ref 0.0–0.2)

## 2021-01-28 LAB — TROPONIN I (HIGH SENSITIVITY): Troponin I (High Sensitivity): 9 ng/L (ref <18)

## 2021-01-28 IMAGING — CR DG CHEST 2V
2 series · 2 of 2 positions shown · non-contrast
Comparison: [DATE]

CLINICAL DATA: Chest pain.

EXAM:
CHEST - 2 VIEW

[chest lat]
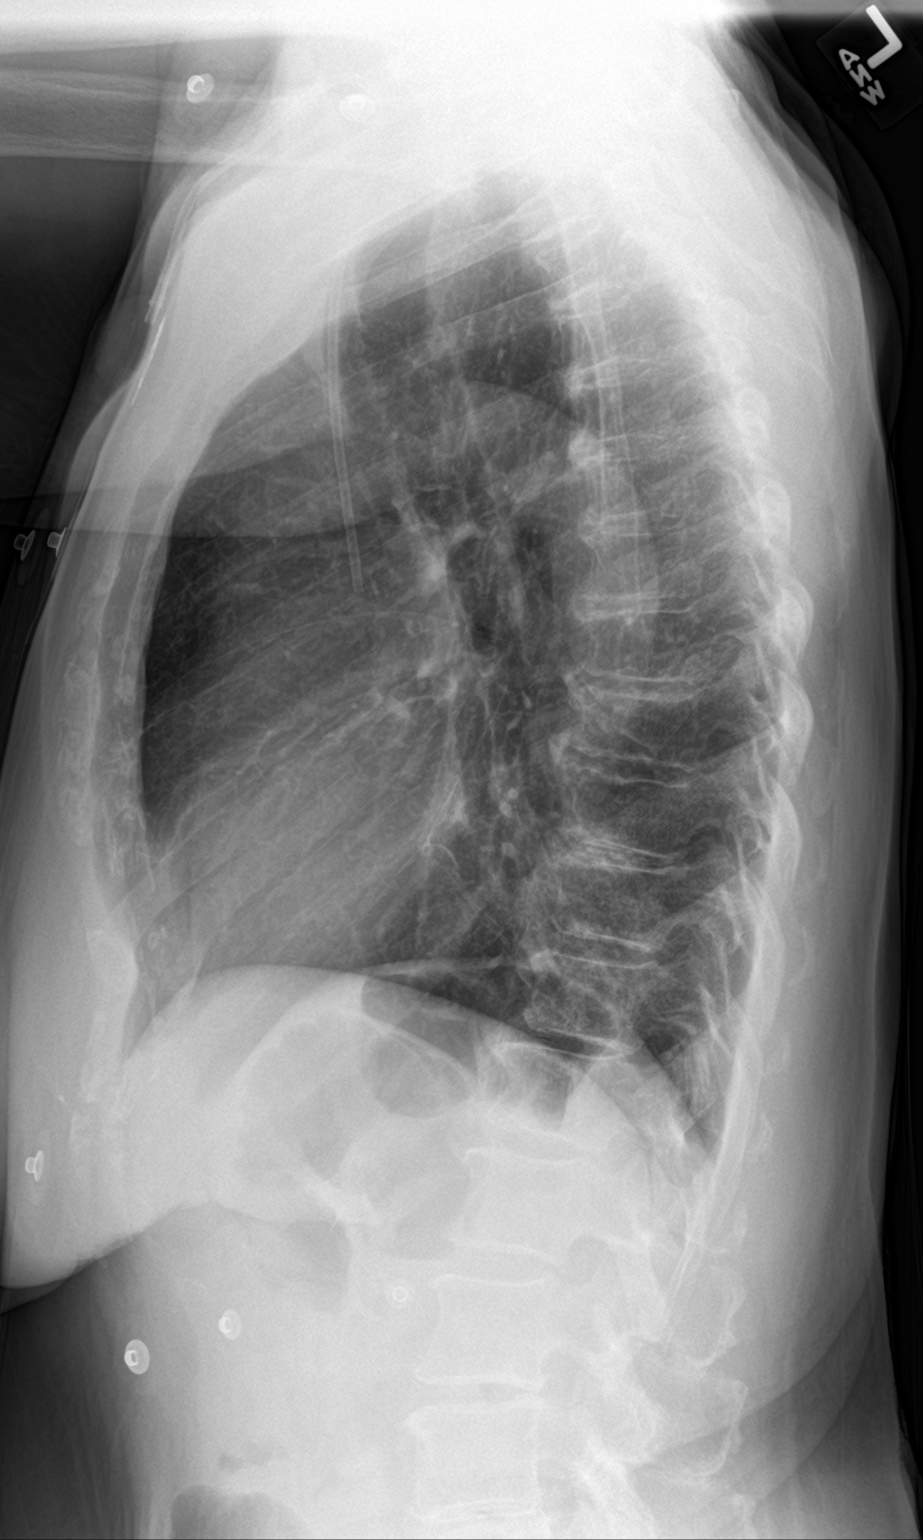

[chest pa]
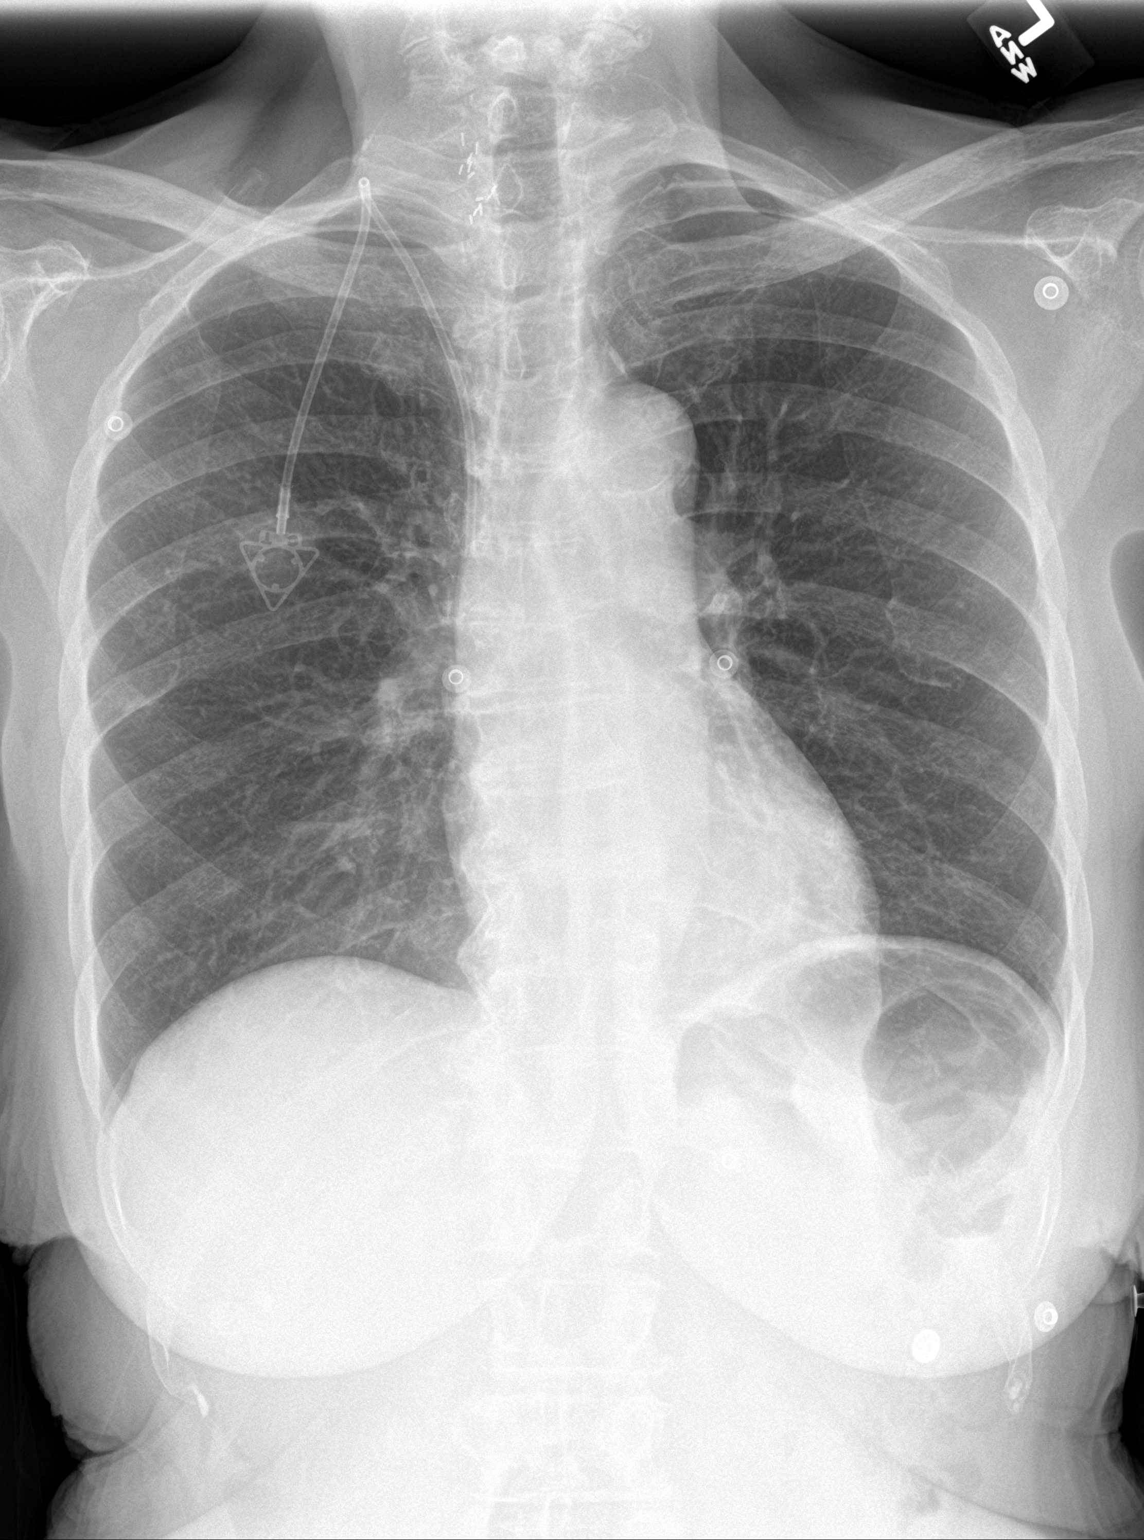

[2 of 2 positions shown; findings below may reference images not displayed]

FINDINGS: Stable position of injectable right-sided port. Postsurgical changes
at the right thoracic inlet.

Cardiomediastinal silhouette is normal. Mediastinal contours appear
intact. Calcific atherosclerotic disease of the aorta.

There is no evidence of focal airspace consolidation, pleural
effusion or pneumothorax.

Osseous structures are without acute abnormality. Soft tissues are
grossly normal.
IMPRESSION: No active cardiopulmonary disease.

## 2021-01-28 NOTE — ED Provider Notes (Signed)
Emergency Medicine Provider Triage Evaluation Note  Angelica [name] , a 80 y.o. female  was evaluated in triage.  Pt complains of palpitations that began this evening. Family member at the bedside and reports patient has had allergies, seen by PCP approximately 3 days ago started on over-the-counter medication to treat this as allergies.  Has been taking DayQuil.   Review of Systems  Positive: palpitations Negative: Fever, chest pain, shortness of breath  Physical Exam  Ht 5\' 7"  (1.702 m)   Wt 65 kg   BMI 22.44 kg/m  Gen:   Awake, no distress   Resp:  Normal effort  MSK:   Moves extremities without difficulty  Other:    Medical Decision Making  Medically screening exam initiated at 7:58 PM.  Appropriate orders placed.  Angelica [name] was informed that the remainder of the evaluation will be completed by another provider, this initial triage assessment does not replace that evaluation, and the importance of remaining in the ED until their evaluation is complete.     Angelica Fitting, PA-C 01/28/21 2003    Long, Joshua G, MD 02/05/21 1750

## 2021-01-28 NOTE — ED Triage Notes (Signed)
Patient arrived with EMS from home reports palpitations this evening and hypertensive BP= 170/110 . Denies chest pain , respirations unlabored .

## 2021-01-29 ENCOUNTER — Other Ambulatory Visit: Payer: Self-pay | Admitting: Allergy

## 2021-01-30 ENCOUNTER — Telehealth: Payer: Self-pay | Admitting: Allergy

## 2021-01-30 ENCOUNTER — Telehealth: Payer: Self-pay | Admitting: Internal Medicine

## 2021-01-30 DIAGNOSIS — J324 Chronic pansinusitis: Secondary | ICD-10-CM

## 2021-01-30 DIAGNOSIS — J339 Nasal polyp, unspecified: Secondary | ICD-10-CM

## 2021-01-30 MED ORDER — IPRATROPIUM BROMIDE 0.06 % NA SOLN: NASAL | 0 refills | Status: DC

## 2021-01-30 NOTE — Telephone Encounter (Signed)
Informed daughter of sending in refill sent in refill to [facility] on [address]

## 2021-01-30 NOTE — Telephone Encounter (Signed)
Daughter calling in regarding recent referral put in for ENT.. says ENT specialist could not see patient until next month  Would like for new referral to be sent to [facility] in [facility] ([address]) on behalf of patient.. fax # [phone]

## 2021-01-30 NOTE — Telephone Encounter (Signed)
Patient's daughter called and said patient lost her prescription for ipratropium, while out of town last week. She is coming in to see Dr. Nelva Bush on 02/02/21, but she said her mom needs this today and would like to pick it up on her lunch break. [facility] on [address]

## 2021-01-30 NOTE — Telephone Encounter (Signed)
Pt's daughter called very upset that no one had sent in patient's prescription. Daughter states she will not wait until Thursday to have this filled. I apologized to daughter, we are short staffed and doing the best that we can. I did explain to daughter we still have patients in the office and that the nurses' would get to her message as soon as they could. Daughter stated it was urgent and it is unacceptable that she has to wait this long for one prescription to be filled. Daughter stated she would wait on the phone until someone was able to speak to her about this. While on hold I was able to see that a nurse from our [facility] location sent in the prescription. I did advise daughter to call the pharmacy to verify this information. Daughter stated she would call the pharmacy.

## 2021-01-30 NOTE — Telephone Encounter (Signed)
Ok this is done 

## 2021-01-31 ENCOUNTER — Encounter (HOSPITAL_COMMUNITY): Payer: Self-pay

## 2021-01-31 ENCOUNTER — Emergency Department (HOSPITAL_COMMUNITY)
Admission: EM | Admit: 2021-01-31 | Discharge: 2021-01-31 | Disposition: A | Discharge status: Home / Self Care | Payer: Medicare HMO | Attending: Emergency Medicine | Admitting: Emergency Medicine

## 2021-01-31 ENCOUNTER — Ambulatory Visit: Payer: Medicare HMO | Admitting: Internal Medicine

## 2021-01-31 ENCOUNTER — Other Ambulatory Visit: Payer: Self-pay

## 2021-01-31 DIAGNOSIS — J45909 Unspecified asthma, uncomplicated: Secondary | ICD-10-CM | POA: Diagnosis not present

## 2021-01-31 DIAGNOSIS — S86911A Strain of unspecified muscle(s) and tendon(s) at lower leg level, right leg, initial encounter: Secondary | ICD-10-CM | POA: Diagnosis not present

## 2021-01-31 DIAGNOSIS — Z87891 Personal history of nicotine dependence: Secondary | ICD-10-CM | POA: Diagnosis not present

## 2021-01-31 DIAGNOSIS — Z91010 Allergy to peanuts: Secondary | ICD-10-CM | POA: Diagnosis not present

## 2021-01-31 DIAGNOSIS — Z85118 Personal history of other malignant neoplasm of bronchus and lung: Secondary | ICD-10-CM | POA: Insufficient documentation

## 2021-01-31 DIAGNOSIS — Z79899 Other long term (current) drug therapy: Secondary | ICD-10-CM | POA: Diagnosis not present

## 2021-01-31 DIAGNOSIS — Z7951 Long term (current) use of inhaled steroids: Secondary | ICD-10-CM | POA: Insufficient documentation

## 2021-01-31 DIAGNOSIS — X58XXXA Exposure to other specified factors, initial encounter: Secondary | ICD-10-CM | POA: Insufficient documentation

## 2021-01-31 DIAGNOSIS — S8991XA Unspecified injury of right lower leg, initial encounter: Secondary | ICD-10-CM | POA: Diagnosis present

## 2021-01-31 DIAGNOSIS — I10 Essential (primary) hypertension: Secondary | ICD-10-CM | POA: Diagnosis not present

## 2021-01-31 DIAGNOSIS — J321 Chronic frontal sinusitis: Secondary | ICD-10-CM | POA: Insufficient documentation

## 2021-01-31 MED ORDER — KETOROLAC TROMETHAMINE 60 MG/2ML IM SOLN
15.0000 mg | Freq: Once | INTRAMUSCULAR | Status: AC
  Administered 2021-01-31: 15 mg via INTRAMUSCULAR
  Filled 2021-01-31: qty 2

## 2021-01-31 MED ORDER — DICLOFENAC SODIUM 1 % EX GEL: 4.0000 g | Freq: Four times a day (QID) | CUTANEOUS | 0 refills | Status: DC

## 2021-01-31 NOTE — ED Triage Notes (Signed)
Pt arrived via [place] with daughter. Per daughter, pt has been c/o right leg pain and worsening nasal congestion since yesterday. Denies any trauma/falls. Denies any known sick contacts.

## 2021-01-31 NOTE — ED Provider Notes (Signed)
[facility] DEPT Provider Note   CSN: [id number] Arrival date & time: 01/31/21  5093     History Chief Complaint  Patient presents with   Leg Pain   Nasal Congestion    Angelica [name] is a 80 y.o. female.  80 yo F with a chief complaints of right lateral lower leg pain.  This been going on for couple days now.  No known injury.  Pain mostly to the outside of the leg.  Worse with dorsiflexion of the foot.  She denies trauma.  Denies back pain.  Had some pain in her side a few days ago that resolved.  She also has been suffering from sinus issues.  Reportedly has chronic sinusitis.  Is seeing allergy and immunology for this.  Has had worsening symptoms over the past 6 weeks or so.  Is on multiple inhalers nasal steroids saline sprays steam exposure.  Has taken a course of oral antibiotics already with this.  Denies fevers or chills.  The history is provided by the patient.  Leg Pain Location:  Leg Time since incident:  2 days Injury: no   Leg location:  R lower leg Pain details:    Quality:  Aching   Radiates to:  Does not radiate   Severity:  Moderate   Onset quality:  Gradual   Duration:  2 days   Timing:  Constant   Progression:  Worsening Chronicity:  New Prior injury to area:  No Relieved by: rubbing. Exacerbated by: dorsiflexion. Ineffective treatments:  None tried Associated symptoms: no fever       Past Medical History:  Diagnosis Date   Arthritis    Asthma    Bowel obstruction (HCC)    Cancer (HCC)    Environmental allergies    GERD (gastroesophageal reflux disease)    Glaucoma 05/09/2017   H. pylori infection    History of radiation therapy 07/05/20-08/05/20   Left lung IMRT Dr. 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 pylori infection    Other allergic rhinitis 03/20/2016   Malnutrition of moderate degree 04/11/2015   HTN (hypertension) 04/10/2015    Past Surgical History:  Procedure Laterality Date   ABDOMINAL HYSTERECTOMY     BOWEL RESECTION N/A 05/23/2020   Procedure: SMALL BOWEL REPAIR;  Surgeon: Kieth Brightly Arta Bruce, MD;  Location: [place];  Service: General;  Laterality: N/A;   BREAST BIOPSY     BRONCHIAL BIOPSY  06/10/2020   Procedure: BRONCHIAL BIOPSIES;  Surgeon: Garner Nash, DO;  Location: [facility];  Service: Pulmonary;;   BRONCHIAL BRUSHINGS  06/10/2020   Procedure: BRONCHIAL BRUSHINGS;  Surgeon: Garner Nash, DO;  Location:  [facility];  Service: Pulmonary;;   BRONCHIAL NEEDLE ASPIRATION BIOPSY  06/10/2020   Procedure: BRONCHIAL NEEDLE ASPIRATION BIOPSIES;  Surgeon: Garner Nash, DO;  Location: [facility];  Service: Pulmonary;;   BRONCHIAL WASHINGS  06/10/2020   Procedure: BRONCHIAL WASHINGS;  Surgeon: Garner Nash, DO;  Location: [place];  Service: Pulmonary;;   COLON SURGERY     DIAGNOSTIC LARYNGOSCOPY N/A 05/23/2020   Procedure: ATTEMPTED DIAGNOSTIC LAPAROSCOPY WITH ADHESIONS;  Surgeon: Kieth Brightly, Arta Bruce, MD;  Location: [facility];  Service: General;  Laterality: N/A;   IR IMAGING GUIDED PORT INSERTION  07/15/2020   KNEE SURGERY     LAPAROTOMY N/A 04/18/2015   Procedure: Exploratory  laparotomy with lysis of adhesions, possible bowel resection;  Surgeon: Ralene Ok, MD;  Location: [facility];  Service: General;  Laterality: N/A;   LAPAROTOMY N/A 05/23/2020   Procedure: EXPLORATORY LAPAROTOMY;  Surgeon: Mickeal Skinner, MD;  Location: [place];  Service: General;  Laterality: N/A;   LYSIS OF ADHESION N/A 05/23/2020   Procedure: LYSIS OF ADHESION;  Surgeon: Kieth Brightly Arta Bruce, MD;  Location: [facility];  Service: General;  Laterality: N/A;   NASAL SINUS SURGERY     THYROID SURGERY     VIDEO BRONCHOSCOPY WITH ENDOBRONCHIAL NAVIGATION Bilateral 06/10/2020   Procedure: VIDEO BRONCHOSCOPY WITH ENDOBRONCHIAL NAVIGATION;  Surgeon: Garner Nash, DO;  Location: [facility];  Service: Pulmonary;  Laterality: Bilateral;   VIDEO BRONCHOSCOPY WITH ENDOBRONCHIAL ULTRASOUND  06/10/2020   Procedure: VIDEO BRONCHOSCOPY WITH ENDOBRONCHIAL ULTRASOUND;  Surgeon: Garner Nash, DO;  Location: [place] [facility];  Service: Pulmonary;;     OB History   No obstetric history on file.     Family History  Problem Relation Age of Onset   Diabetes Mother    Hypertension Mother    Hypertension Father    Glaucoma Sister    Glaucoma Brother    Allergic rhinitis Neg Hx    Angioedema Neg Hx    Asthma Neg Hx    Eczema Neg Hx    Immunodeficiency Neg Hx    Urticaria Neg Hx     Social History   Tobacco Use   Smoking status: Former    Types: Cigarettes   Smokeless tobacco: Never   Tobacco comments:    quit 60 years ago  Vaping Use   Vaping Use: Never used  Substance Use Topics   Alcohol use: No   Drug use: No    Home Medications Prior to Admission medications   Medication Sig Start Date End Date Taking? Authorizing Provider  diclofenac Sodium (VOLTAREN) 1 % GEL Apply 4 g topically 4 (four) times daily. 01/31/21  Yes Deno Etienne, DO  acetaminophen (TYLENOL) 325 MG tablet Take 1 tablet (325 mg total) by mouth every 6 (six) hours as needed for headache or mild pain. 10/22/20   Hongalgi, Lenis Dickinson, MD  alum & mag hydroxide-simeth (MAALOX/MYLANTA) 200-200-20 MG/5ML suspension Take 15 mLs by mouth every 6 (six) hours as needed for indigestion or heartburn.    [provider]  amLODipine (NORVASC) 5 MG tablet Take 1 tablet (5 mg total) by mouth daily. 12/08/20 12/08/21  [name], MD  amoxicillin-clavulanate (AUGMENTIN) 875-125 MG tablet Take 1 tablet by mouth 2 (two) times daily. Patient not taking: Reported on 01/25/2021 01/05/21   Sigurd Sos, MD  Benralizumab [facility] PEN) 30 MG/ML SOAJ Inject into the skin every 8 (eight) weeks.    [provider]  budesonide-formoterol (SYMBICORT) 80-4.5 MCG/ACT inhaler Inhale 2 puffs  into the lungs 2 (two) times daily. Use for asthma flares 2 puffs twice a day for 2 weeks or until cough and wheeze free 01/28/20   Kennith Gain, MD  Cholecalciferol (VITAMIN D3) 50 MCG (2000 UT) TABS Take 2,000 Units by mouth daily.    [provider]  doxycycline (VIBRAMYCIN) 100 MG capsule Take 1 capsule (100 mg total) by mouth 2 (two) times daily. Patient not taking: Reported on 01/25/2021 12/29/20   Montine Circle, PA-C  Durvalumab (IMFINZI IV) Inject 1,500 mg into the vein every 28 (twenty-eight) days. Every 4 weeks    Curt Bears, MD  EPINEPHrine (EPIPEN 2-PAK) 0.3 mg/0.3 mL IJ SOAJ injection Use as directed for severe allergic reaction 01/28/20   Kennith Gain, MD  fluticasone [facility]) 50 MCG/ACT nasal spray Place 2 sprays into both nostrils daily. 01/04/21   Sigurd Sos, MD  ipratropium (ATROVENT) 0.06 % nasal spray USE 2 SPRAY(S) IN [facility] NOSTRIL THREE TIMES DAILY 01/30/21   Kennith Gain, MD  levofloxacin (LEVAQUIN) 500 MG tablet Take 1 tablet (500 mg total) by mouth daily. Patient not taking: Reported on 01/25/2021 01/04/21   Sigurd Sos, MD  linaclotide [facility]) 72 MCG capsule TAKE 1 CAPSULE BY MOUTH ONCE DAILY AS NEEDED Patient taking differently: Take 72 mcg by mouth daily as needed (constipation).  06/14/20   [name], MD  loratadine (CLARITIN) 10 MG tablet Take 10 mg by mouth daily.    [provider]  Magnesium Hydroxide (PHILLIPS MILK OF MAGNESIA PO) Take 15-30 mLs by mouth daily as needed (for constipation).    [provider]  montelukast (SINGULAIR) 10 MG tablet Take 1 tablet (10 mg total) by mouth at bedtime. 08/15/20   Kennith Gain, MD  ondansetron (ZOFRAN) 8 MG tablet Take 1 tablet (8 mg total) by mouth every 8 (eight) hours as needed for nausea or vomiting. 11/02/20   Curt Bears, MD  polyethylene glycol (MIRALAX / GLYCOLAX) 17 g packet Take 17 g by mouth daily as needed for mild constipation.    [provider]  pravastatin (PRAVACHOL) 80 MG tablet Take 1 tablet (80 mg total) by mouth daily. 06/14/20   [name], MD  prochlorperazine (COMPAZINE) 10 MG tablet Take 1 tablet (10 mg total) by mouth every 6 (six) hours as needed for nausea or vomiting. 06/22/20   Curt Bears, MD  traMADol (ULTRAM) 50 MG tablet Take 1 tablet (50 mg total) by mouth every 6 (six) hours as needed for moderate pain. 08/01/20   [name], MD  vitamin B-12 (CYANOCOBALAMIN) 1000 MCG tablet Take 1,000 mcg by mouth daily.    [provider]    Allergies    Asa buff (mag [buffered aspirin], Ensure [nutritional supplements], Fish allergy, Other, Peanut-containing drug products, Penicillins, Shellfish-derived products, and Strawberry extract  Review of Systems   Review of Systems  Constitutional:  Negative for chills and fever.  HENT:  Negative for congestion and rhinorrhea.   Eyes:  Negative for redness and visual disturbance.  Respiratory:  Negative for shortness of breath and wheezing.   Cardiovascular:  Negative for chest pain and palpitations.  Gastrointestinal:  Negative for nausea and vomiting.  Genitourinary:  Negative for dysuria and urgency.  Musculoskeletal:  Positive for myalgias. Negative for arthralgias.  Skin:  Negative for pallor  and wound.  Neurological:  Negative for dizziness and headaches.   Physical Exam Updated Vital Signs BP (!) 162/93 (BP Location: Left Arm)   Pulse 83  Temp 97.8 F (36.6 C) (Oral)   Resp 18   SpO2 100%   Physical Exam Vitals and nursing note reviewed.  Constitutional:      General: She is not in acute distress.    Appearance: She is well-developed. She is not diaphoretic.  HENT:     Head: Normocephalic and atraumatic.     Comments: Swollen turbinates, posterior nasal drip, no noted sinus ttp, tm normal bilaterally.   Eyes:     Pupils: Pupils are equal, round, and reactive to light.  Cardiovascular:     Rate and Rhythm: Normal rate and regular rhythm.     Heart sounds: No murmur heard.   No friction rub. No gallop.  Pulmonary:     Effort: Pulmonary effort is normal.     Breath sounds: No wheezing or rales.  Abdominal:     General: There is no distension.     Palpations: Abdomen is soft.     Tenderness: There is no abdominal tenderness.  Musculoskeletal:        General: Tenderness present.     Cervical back: Normal range of motion and neck supple.     Comments: Pain along the musculature of the right lower leg.  No bruising no edema.  No pain along the tibia.  No pain at either malleolus.  Pulse motor and sensation intact distally.  No pain about the knee.  No midline spinal tenderness step-offs or deformities.  Skin:    General: Skin is warm and dry.  Neurological:     Mental Status: She is alert and oriented to person, place, and time.  Psychiatric:        Behavior: Behavior normal.    ED Results / Procedures / Treatments   Labs (all labs ordered are listed, but only abnormal results are displayed) Labs Reviewed - No data to display  EKG None  Radiology No results found.  Procedures Procedures   Medications Ordered in ED Medications  ketorolac (TORADOL) injection 15 mg (has no administration in time range)    ED Course  I have reviewed the triage  vital signs and the nursing notes.  Pertinent labs & imaging results that were available during my care of the patient were reviewed by me and considered in my medical decision making (see chart for details).    MDM Rules/Calculators/A&P                           80 yo F with a chief complaints of right leg pain and sinus congestion and drainage.  Right leg pain is new and going on for about 48 hours.  Most likely the patient has a muscular strain based on exam.  No bony tenderness no trauma.  Do not feel that imaging is warranted.  We will treat symptomatically.  Patient is already on therapy much exceeding what I would typically do for sinusitis.  She has a follow-up appointment in 48 hours time.  I did offer to do another course of antibiotics and they are electing to hold off until they see their allergist.  They are requesting referral to ENT and have been having some trouble arranging for an urgent outpatient visit.  10:31 AM:  I have discussed the diagnosis/risks/treatment options with the patient and family and believe the pt to be eligible for discharge home to follow-up with PCP, ENT. We also discussed returning to the ED immediately if new or worsening sx occur. We discussed  the sx which are most concerning (e.g., sudden worsening pain, fever, inability to tolerate by mouth) that necessitate immediate return. Medications administered to the patient during their visit and any new prescriptions provided to the patient are listed below.  Medications given during this visit Medications  ketorolac (TORADOL) injection 15 mg (has no administration in time range)     The patient appears reasonably screen and/or stabilized for discharge and I doubt any other medical condition or other [facility] requiring further screening, evaluation, or treatment in the ED at this time prior to discharge.   Final Clinical Impression(s) / ED Diagnoses Final diagnoses:  Chronic frontal sinusitis  Muscle strain  of right lower leg, initial encounter    Rx / DC Orders ED Discharge Orders          Ordered    diclofenac Sodium (VOLTAREN) 1 % GEL  4 times daily        01/31/21 Carson, Georgeann Brinkman, DO 01/31/21 1031

## 2021-01-31 NOTE — Discharge Instructions (Addendum)
Use the gel as prescribed. Also take tylenol 1000mg (2 extra strength) four times a day.  Follow up with your PCP.  [facility] was on call today, gave you their info in the paperwork.  Please call and see if you can make an appointment.

## 2021-02-01 ENCOUNTER — Ambulatory Visit (INDEPENDENT_AMBULATORY_CARE_PROVIDER_SITE_OTHER): Payer: Medicare HMO | Admitting: Internal Medicine

## 2021-02-01 ENCOUNTER — Encounter: Payer: Self-pay | Admitting: Internal Medicine

## 2021-02-01 ENCOUNTER — Other Ambulatory Visit: Payer: Self-pay | Admitting: *Deleted

## 2021-02-01 VITALS — BP 118/70 | HR 88 | Temp 97.7°F | Ht 67.0 in | Wt 147.0 lb

## 2021-02-01 DIAGNOSIS — Z23 Encounter for immunization: Secondary | ICD-10-CM | POA: Diagnosis not present

## 2021-02-01 DIAGNOSIS — R269 Unspecified abnormalities of gait and mobility: Secondary | ICD-10-CM | POA: Diagnosis not present

## 2021-02-01 DIAGNOSIS — I10 Essential (primary) hypertension: Secondary | ICD-10-CM | POA: Diagnosis not present

## 2021-02-01 DIAGNOSIS — R5381 Other malaise: Secondary | ICD-10-CM

## 2021-02-01 DIAGNOSIS — J324 Chronic pansinusitis: Secondary | ICD-10-CM

## 2021-02-01 NOTE — Patient Instructions (Signed)
You had the flu shot today  You will be contacted regarding the referral for: ENT, and the outpatient Physical Therapy  Please continue all other medications as before, and refills have been done if requested.  Please have the pharmacy call with any other refills you may need.  Please continue your efforts at being more active, low cholesterol diet, and weight control.  Please keep your appointments with your specialists as you may have planned  Please make an Appointment to return in 6 months, or sooner if needed

## 2021-02-01 NOTE — Progress Notes (Signed)
Patient ID: Angelica [name], female   DOB: [birth year], 80 y.o.   MRN: [id number]        Chief Complaint: follow up with daughter with sinus symptoms, general debility, htn, asthma       HPI:  Angelica [name] is a 79 y.o. female here as above with daughter, has long hx of chronic sinusitis seen in ED yesterday with dx acute sinusitis on chronic, overall improved today with antibx tx, and plan is to f/u with [facility] soon.  Denies worsening symtpoms of pain, fever, swelling, or d/c today.  Due for flu shot today.  Also with worsening generalized weakness and more off balance it seems with ambulation, no falls yet but family is concerned.  Also seeing oncology with lung CA.  Pt denies chest pain, increased sob or doe, wheezing, orthopnea, PND, increased LE swelling, palpitations, dizziness or syncope.   Pt denies polydipsia, polyuria, or new focal neuro s/s.   Pt denies  night sweats, loss of appetite, and has gained several lbs recently.    Wt Readings from Last 3 Encounters:  02/02/21 147 lb 3.2 oz (66.8 kg)  02/01/21 147 lb (66.7 kg)  01/28/21 143 lb 4.8 oz (65 kg)   BP Readings from Last 3 Encounters:  02/02/21 120/76  02/01/21 118/70  01/31/21 (!) 162/93         Past Medical History:  Diagnosis Date   Arthritis    Asthma    Bowel obstruction (HCC)    Cancer (HCC)    Environmental allergies    GERD (gastroesophageal reflux disease)    Glaucoma 05/09/2017   H. pylori infection    History of radiation therapy 07/05/20-08/05/20   Left lung IMRT Dr. Sondra Come    HLD (hyperlipidemia) 01/16/2019   Hypertension    Iron deficiency anemia    Osteopenia 05/26/2017   Thyroid disease    Urticaria 07/25/2018   Past Surgical History:  Procedure Laterality Date   ABDOMINAL HYSTERECTOMY     BOWEL RESECTION N/A 05/23/2020   Procedure: SMALL BOWEL REPAIR;  Surgeon: Kieth Brightly, Arta Bruce, MD;  Location: [place];  Service: General;  Laterality: N/A;   BREAST BIOPSY     BRONCHIAL BIOPSY   06/10/2020   Procedure: BRONCHIAL BIOPSIES;  Surgeon: Garner Nash, DO;  Location: [facility];  Service: Pulmonary;;   BRONCHIAL BRUSHINGS  06/10/2020   Procedure: BRONCHIAL BRUSHINGS;  Surgeon: Garner Nash, DO;  Location: [place];  Service: Pulmonary;;   BRONCHIAL NEEDLE ASPIRATION BIOPSY  06/10/2020   Procedure: BRONCHIAL NEEDLE ASPIRATION BIOPSIES;  Surgeon: Garner Nash, DO;  Location: [facility];  Service: Pulmonary;;   BRONCHIAL WASHINGS  06/10/2020   Procedure: BRONCHIAL WASHINGS;  Surgeon: Garner Nash, DO;  Location: [facility];  Service: Pulmonary;;   COLON SURGERY     DIAGNOSTIC LARYNGOSCOPY N/A 05/23/2020   Procedure: ATTEMPTED DIAGNOSTIC LAPAROSCOPY WITH ADHESIONS;  Surgeon: Kieth Brightly, Arta Bruce, MD;  Location: [facility];  Service: General;  Laterality: N/A;   IR IMAGING GUIDED PORT INSERTION  07/15/2020   KNEE SURGERY     LAPAROTOMY N/A 04/18/2015   Procedure: Exploratory laparotomy with lysis of adhesions, possible bowel resection;  Surgeon: Ralene Ok, MD;  Location: [facility];  Service: General;  Laterality: N/A;   LAPAROTOMY N/A 05/23/2020   Procedure: EXPLORATORY LAPAROTOMY;  Surgeon: Mickeal Skinner, MD;  Location: [place];  Service: General;  Laterality: N/A;   LYSIS OF ADHESION N/A 05/23/2020   Procedure: LYSIS OF ADHESION;  Surgeon: Kieth Brightly, Arta Bruce, MD;  Location: [place];  Service: General;  Laterality: N/A;   NASAL SINUS SURGERY     THYROID SURGERY     VIDEO BRONCHOSCOPY WITH ENDOBRONCHIAL NAVIGATION Bilateral 06/10/2020   Procedure: VIDEO BRONCHOSCOPY WITH ENDOBRONCHIAL NAVIGATION;  Surgeon: Garner Nash, DO;  Location: [facility];  Service: Pulmonary;  Laterality: Bilateral;   VIDEO BRONCHOSCOPY WITH ENDOBRONCHIAL ULTRASOUND  06/10/2020   Procedure: VIDEO BRONCHOSCOPY WITH ENDOBRONCHIAL ULTRASOUND;  Surgeon: Garner Nash, DO;  Location: [facility];  Service: Pulmonary;;    reports that she has quit smoking. Her smoking use included  cigarettes. She has never used smokeless tobacco. She reports that she does not drink alcohol and does not use drugs. family history includes Diabetes in her mother; Glaucoma in her brother and sister; Hypertension in her father and mother. Allergies  Allergen Reactions   Asa Buff (Mag [Buffered Aspirin] Other (See Comments)    Excessive sweating   Ensure [Nutritional Supplements]     Or boost - upset stomach    Fish Allergy Other (See Comments)    Unknown reaction, but allergic   Other Other (See Comments)    Gelcaps; gel-containing capsules; extended-release medication - upset stomach    Peanut-Containing Drug Products Other (See Comments)    From allergy test   Penicillins Itching    Has patient had a PCN reaction causing immediate rash, facial/tongue/throat swelling, SOB or lightheadedness with hypotension: No Has patient had a PCN reaction causing severe rash involving mucus membranes or skin necrosis: No Has patient had a PCN reaction that required hospitalization No Has patient had a PCN reaction occurring within the last 10 years: No If all of the above answers are "NO", then may proceed with Cephalosporin use.   Shellfish-Derived Products Other (See Comments)    From allergy test   Strawberry Extract Other (See Comments)    From allergy test    Current Outpatient Medications on File Prior to Visit  Medication Sig Dispense Refill   acetaminophen (TYLENOL) 325 MG tablet Take 1 tablet (325 mg total) by mouth every 6 (six) hours as needed for headache or mild pain.     alum & mag hydroxide-simeth (MAALOX/MYLANTA) 200-200-20 MG/5ML suspension Take 15 mLs by mouth every 6 (six) hours as needed for indigestion or heartburn.     amLODipine (NORVASC) 5 MG tablet Take 1 tablet (5 mg total) by mouth daily. 90 tablet 3   amoxicillin-clavulanate (AUGMENTIN) 875-125 MG tablet Take 1 tablet by mouth 2 (two) times daily. 20 tablet 0   Benralizumab (FASENRA PEN) 30 MG/ML SOAJ Inject into the  skin every 8 (eight) weeks.     budesonide-formoterol (SYMBICORT) 80-4.5 MCG/ACT inhaler Inhale 2 puffs into the lungs 2 (two) times daily. Use for asthma flares 2 puffs twice a day for 2 weeks or until cough and wheeze free 10.2 g 5   Cholecalciferol (VITAMIN D3) 50 MCG (2000 UT) TABS Take 2,000 Units by mouth daily.     diclofenac Sodium (VOLTAREN) 1 % GEL Apply 4 g topically 4 (four) times daily. 100 g 0   doxycycline (VIBRAMYCIN) 100 MG capsule Take 1 capsule (100 mg total) by mouth 2 (two) times daily. 20 capsule 0   Durvalumab (IMFINZI IV) Inject 1,500 mg into the vein every 28 (twenty-eight) days. Every 4 weeks     EPINEPHrine (EPIPEN 2-PAK) 0.3 mg/0.3 mL IJ SOAJ injection Use as directed for severe allergic reaction 2 each 1   fluticasone (FLONASE) 50 MCG/ACT nasal  spray Place 2 sprays into both nostrils daily. 16 g 5   ipratropium (ATROVENT) 0.06 % nasal spray USE 2 SPRAY(S) IN EACH NOSTRIL THREE TIMES DAILY 15 mL 0   levofloxacin (LEVAQUIN) 500 MG tablet Take 1 tablet (500 mg total) by mouth daily. 10 tablet 0   linaclotide (LINZESS) 72 MCG capsule TAKE 1 CAPSULE BY MOUTH ONCE DAILY AS NEEDED (Patient taking differently: Take 72 mcg by mouth daily as needed (constipation).) 30 capsule 0   loratadine (CLARITIN) 10 MG tablet Take 10 mg by mouth daily.     Magnesium Hydroxide (PHILLIPS MILK OF MAGNESIA PO) Take 15-30 mLs by mouth daily as needed (for constipation).     montelukast (SINGULAIR) 10 MG tablet Take 1 tablet (10 mg total) by mouth at bedtime. 30 tablet 5   ondansetron (ZOFRAN) 8 MG tablet Take 1 tablet (8 mg total) by mouth every 8 (eight) hours as needed for nausea or vomiting. 20 tablet 0   polyethylene glycol (MIRALAX / GLYCOLAX) 17 g packet Take 17 g by mouth daily as needed for mild constipation.     pravastatin (PRAVACHOL) 80 MG tablet Take 1 tablet (80 mg total) by mouth daily. 90 tablet 3   prochlorperazine (COMPAZINE) 10 MG tablet Take 1 tablet (10 mg total) by mouth every  6 (six) hours as needed for nausea or vomiting. 30 tablet 0   traMADol (ULTRAM) 50 MG tablet Take 1 tablet (50 mg total) by mouth every 6 (six) hours as needed for moderate pain. 30 tablet 2   vitamin B-12 (CYANOCOBALAMIN) 1000 MCG tablet Take 1,000 mcg by mouth daily.     Current Facility-Administered Medications on File Prior to Visit  Medication Dose Route Frequency Provider Last Rate Last Admin   Benralizumab SOSY 30 mg  30 mg Subcutaneous Q8 Weeks Kennith Gain, MD   30 mg at 12/20/20 0849        ROS:  All others reviewed and negative.  Objective        PE:  BP 118/70 (BP Location: Left Arm, Patient Position: Sitting, Cuff Size: Normal)   Pulse 88   Temp 97.7 F (36.5 C) (Oral)   Ht 5\' 7"  (1.702 m)   Wt 147 lb (66.7 kg)   SpO2 99%   BMI 23.02 kg/m                 Constitutional: Pt appears in NAD               HENT: Head: NCAT.                Right Ear: External ear normal.                 Left Ear: External ear normal.                Eyes: . Pupils are equal, round, and reactive to light. Conjunctivae and EOM are normal               Nose: without d/c or deformity               Neck: Neck supple. Gross normal ROM               Cardiovascular: Normal rate and regular rhythm.                 Pulmonary/Chest: Effort normal and breath sounds without rales or wheezing.  Abd:  Soft, NT, ND, + BS, no organomegaly               Neurological: Pt is alert. At baseline orientation, motor grossly intact               Skin: Skin is warm. No rashes, no other new lesions, LE edema - none               Psychiatric: Pt behavior is normal without agitation   Micro: none  Cardiac tracings I have personally interpreted today:  none  Pertinent Radiological findings (summarize): none   Lab Results  Component Value Date   WBC 5.1 01/28/2021   HGB 13.6 01/28/2021   HCT 40.7 01/28/2021   PLT 222 01/28/2021   GLUCOSE 111 (H) 01/28/2021   CHOL 208 (H) 05/03/2020    TRIG 99 05/30/2020   HDL 69.90 05/03/2020   LDLCALC 120 (H) 05/03/2020   ALT 15 01/28/2021   AST 23 01/28/2021   NA 137 01/28/2021   K 3.4 (L) 01/28/2021   CL 106 01/28/2021   CREATININE 0.74 01/28/2021   BUN 9 01/28/2021   CO2 19 (L) 01/28/2021   TSH 8.167 (H) 11/30/2020   INR 1.0 11/02/2020   HGBA1C 5.8 05/03/2020   Assessment/Plan:  Angelica [name] is a 80 y.o. Black or African American [2] female with  has a past medical history of Arthritis, Asthma, Bowel obstruction ([place]), Cancer ([place]), Environmental allergies, GERD (gastroesophageal reflux disease), Glaucoma (05/09/2017), H. pylori infection, History of radiation therapy (07/05/20-08/05/20), HLD (hyperlipidemia) (01/16/2019), Hypertension, Iron deficiency anemia, Osteopenia (05/26/2017), Thyroid disease, and Urticaria (07/25/2018).  HTN (hypertension) BP Readings from Last 3 Encounters:  02/02/21 120/76  02/01/21 118/70  01/31/21 (!) 162/93   Stable, pt to continue medical treatment norvasc   Gait disorder Encouraged use of cane with walking  Debility Also for referral for outpatient PT  Chronic pansinusitis With acute flare, for cont'd tx with antibx, and f/u ENT as planned  Followup: Return in about 6 months (around 08/01/2021).  Cathlean Cower, MD 02/06/2021 10:23 AM [facility]

## 2021-02-02 ENCOUNTER — Other Ambulatory Visit: Payer: Self-pay

## 2021-02-02 ENCOUNTER — Encounter: Payer: Self-pay | Admitting: Internal Medicine

## 2021-02-02 ENCOUNTER — Encounter: Payer: Self-pay | Admitting: Allergy

## 2021-02-02 ENCOUNTER — Ambulatory Visit (INDEPENDENT_AMBULATORY_CARE_PROVIDER_SITE_OTHER): Payer: Medicare HMO | Admitting: Allergy

## 2021-02-02 VITALS — BP 120/76 | HR 84 | Temp 97.7°F | Resp 16 | Wt 147.2 lb

## 2021-02-02 DIAGNOSIS — J3089 Other allergic rhinitis: Secondary | ICD-10-CM

## 2021-02-02 DIAGNOSIS — J339 Nasal polyp, unspecified: Secondary | ICD-10-CM | POA: Diagnosis not present

## 2021-02-02 DIAGNOSIS — J343 Hypertrophy of nasal turbinates: Secondary | ICD-10-CM

## 2021-02-02 DIAGNOSIS — J4550 Severe persistent asthma, uncomplicated: Secondary | ICD-10-CM | POA: Diagnosis not present

## 2021-02-02 DIAGNOSIS — K219 Gastro-esophageal reflux disease without esophagitis: Secondary | ICD-10-CM

## 2021-02-02 NOTE — Telephone Encounter (Signed)
I told Angelica [name] that pt may take Prednisone as prescribed by PCP. She is no longer on immunotherapy as of 11/02/20.

## 2021-02-02 NOTE — Patient Instructions (Addendum)
  1. Continue Symbicort 2 puffs twice a day for asthma maintenance control  2. Continue Fasenra injections every 8 weeks.     3. Use Ipratropium 0.06% 2 sprays each nostril in morning and at bedtime to help with nasal drainage control  4. Continue Singulair and Claritin daily  5. For severe nasal congestion recommend the following:       - prednisone 30mg  daily x 5 days (confirm ok to use with oncologist)       - if prednisone is not ok to take then get OTC Afrin and use Afrin 2 sprays each nostril twice a day for 3-5 days max.  After use wait 5-15 minutes or until you can breath more freely through the nose.  Then use Flonase 2 sprays each nostril twice a day for next 2 weeks.  Once nasal congestion has improved then can decrease Flonase down to once a day use      - can use Ipatropium as above      - continue your nasal rinses and steam treatments and simply saline      - will place ENT referral   6.  If needed:   A.  Ventolin HFA 2 puffs or albuterol nebulization every 4-6 hours  B.  Nasal saline spray to help moisturize nose     7. Return to clinic in 2-3 months or earlier if problem

## 2021-02-02 NOTE — Progress Notes (Signed)
Follow-up Note  RE: Angelica [name] MRN: [id number] DOB: [birth year] Date of Office Visit: 02/02/2021   History of present illness: Angelica [name] is a 79 y.o. female presenting today for continued nasal congestion.  She has history of asthma on biologic agent as well as chronic rhinitis.  She presents today with her daughter.  She was seen in the office on 01/04/2021 by Dr. Simona Huh for acute bacterial sinusitis.  She was recommended to stop doxycycline and start Levaquin.  She was also provided with Xhance samples to use.  However with the Levaquin she was noting stomach issues after taking it.  Thus it was changed to Augmentin which she completed.  However she has continued to have nasal congestion that is leading to difficulty breathing through the nose.  On 01/25/2021 she saw her PCP for continued nasal congestion and recommended she see ENT however she currently does not have an appointment and is not sure if the referral was placed.  She went to the ED on 01/28/21 for palpitations and had an unremarkable exam and visit and was discharged home.  She went back to the ED on 01/31/2021 for leg pain and nasal congestion where on exam she was noted to have "swollen turbinates, posterior nasal drip, no noted sinus TTP". In regards to the ENT appointment her daughter states that Dr Benjamine Mola did not have any openings until October and that she could see someone in [facility] as soon as next week however this would require the referral placement.  Her daughter would like for her to see ENT as soon as possible. Her daughter states the Truett Perna was hard for her to do with needing to blow and push on the spray at the same time.  She does have regular Flonase at home.  She is doing nasal rinse, steam treatment, simply saline, ipratopium 2 sprays 3 time.  Also taking claritin and singulair daily.   Her asthma has been doing quite fine.  She continues on Symbicort and Singulair as well as her Fasenra  injections.  She has not required albuterol use.  She denies any daytime or nighttime symptoms.  She has not needed ED or urgent care visits or systemic steroid needs for her asthma. 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  She states that her tumor is shrunken.  At this time she is just in a monitoring state.  She will see her oncologist every 3 months for monitoring.   Review of systems: Review of Systems  Constitutional: Negative.   HENT:  Positive for congestion.   Eyes: Negative.   Respiratory: Negative.    Cardiovascular: Negative.   Gastrointestinal: Negative.   Musculoskeletal: Negative.   Skin: Negative.   Neurological: Negative.    All other systems negative unless noted above in HPI  Past medical/social/surgical/family history have been reviewed and are unchanged unless specifically indicated below.  No changes  Medication List: Current Outpatient Medications  Medication Sig Dispense Refill   acetaminophen (TYLENOL) 325 MG tablet Take 1 tablet (325 mg total) by mouth every 6 (six) hours as needed for headache or mild pain.     alum & mag hydroxide-simeth (MAALOX/MYLANTA) 200-200-20 MG/5ML suspension Take 15 mLs by mouth every 6 (six) hours as needed for indigestion or heartburn.     amLODipine (NORVASC) 5 MG tablet Take 1 tablet (5 mg total) by mouth daily. 90 tablet 3   amoxicillin-clavulanate (AUGMENTIN) 875-125 MG tablet Take  1 tablet by mouth 2 (two) times daily. 20 tablet 0   Benralizumab (FASENRA PEN) 30 MG/ML SOAJ Inject into the skin every 8 (eight) weeks.     budesonide-formoterol (SYMBICORT) 80-4.5 MCG/ACT inhaler Inhale 2 puffs into the lungs 2 (two) times daily. Use for asthma flares 2 puffs twice a day for 2 weeks or until cough and wheeze free 10.2 g 5   Cholecalciferol (VITAMIN D3) 50 MCG (2000 UT) TABS Take 2,000 Units by mouth daily.     diclofenac Sodium (VOLTAREN) 1 % GEL Apply 4 g topically 4 (four) times daily.  100 g 0   doxycycline (VIBRAMYCIN) 100 MG capsule Take 1 capsule (100 mg total) by mouth 2 (two) times daily. 20 capsule 0   Durvalumab (IMFINZI IV) Inject 1,500 mg into the vein every 28 (twenty-eight) days. Every 4 weeks     EPINEPHrine (EPIPEN 2-PAK) 0.3 mg/0.3 mL IJ SOAJ injection Use as directed for severe allergic reaction 2 each 1   fluticasone (FLONASE) 50 MCG/ACT nasal spray Place 2 sprays into both nostrils daily. 16 g 5   ipratropium (ATROVENT) 0.06 % nasal spray USE 2 SPRAY(S) IN EACH NOSTRIL THREE TIMES DAILY 15 mL 0   levofloxacin (LEVAQUIN) 500 MG tablet Take 1 tablet (500 mg total) by mouth daily. 10 tablet 0   linaclotide (LINZESS) 72 MCG capsule TAKE 1 CAPSULE BY MOUTH ONCE DAILY AS NEEDED (Patient taking differently: Take 72 mcg by mouth daily as needed (constipation).) 30 capsule 0   loratadine (CLARITIN) 10 MG tablet Take 10 mg by mouth daily.     Magnesium Hydroxide (PHILLIPS MILK OF MAGNESIA PO) Take 15-30 mLs by mouth daily as needed (for constipation).     montelukast (SINGULAIR) 10 MG tablet Take 1 tablet (10 mg total) by mouth at bedtime. 30 tablet 5   ondansetron (ZOFRAN) 8 MG tablet Take 1 tablet (8 mg total) by mouth every 8 (eight) hours as needed for nausea or vomiting. 20 tablet 0   polyethylene glycol (MIRALAX / GLYCOLAX) 17 g packet Take 17 g by mouth daily as needed for mild constipation.     pravastatin (PRAVACHOL) 80 MG tablet Take 1 tablet (80 mg total) by mouth daily. 90 tablet 3   prochlorperazine (COMPAZINE) 10 MG tablet Take 1 tablet (10 mg total) by mouth every 6 (six) hours as needed for nausea or vomiting. 30 tablet 0   traMADol (ULTRAM) 50 MG tablet Take 1 tablet (50 mg total) by mouth every 6 (six) hours as needed for moderate pain. 30 tablet 2   vitamin B-12 (CYANOCOBALAMIN) 1000 MCG tablet Take 1,000 mcg by mouth daily.     Current Facility-Administered Medications  Medication Dose Route Frequency Provider Last Rate Last Admin   Benralizumab SOSY  30 mg  30 mg Subcutaneous Q8 Weeks Kennith Gain, MD   30 mg at 12/20/20 7341     Known medication allergies: Allergies  Allergen Reactions   Asa Buff (Mag [Buffered Aspirin] Other (See Comments)    Excessive sweating   Ensure [Nutritional Supplements]     Or boost - upset stomach    Fish Allergy Other (See Comments)    Unknown reaction, but allergic   Other Other (See Comments)    Gelcaps; gel-containing capsules; extended-release medication - upset stomach    Peanut-Containing Drug Products Other (See Comments)    From allergy test   Penicillins Itching    Has patient had a PCN reaction causing immediate rash, facial/tongue/throat swelling, SOB or lightheadedness  with hypotension: No Has patient had a PCN reaction causing severe rash involving mucus membranes or skin necrosis: No Has patient had a PCN reaction that required hospitalization No Has patient had a PCN reaction occurring within the last 10 years: No If all of the above answers are "NO", then may proceed with Cephalosporin use.   Shellfish-Derived Products Other (See Comments)    From allergy test   Strawberry Extract Other (See Comments)    From allergy test      Physical examination: Blood pressure 120/76, pulse 84, temperature 97.7 F (36.5 C), resp. rate 16, weight 147 lb 3.2 oz (66.8 kg), SpO2 99 %.  General: Alert, interactive, in no acute distress. HEENT: PERRLA, TMs pearly gray, turbinates markedly edematous with clear discharge, post-pharynx non erythematous. Neck: Supple without lymphadenopathy. Lungs: Clear to auscultation without wheezing, rhonchi or rales. {no increased work of breathing. CV: Normal S1, S2 without murmurs. Abdomen: Nondistended, nontender. Skin: Warm and dry, without lesions or rashes. Extremities:  No clubbing, cyanosis or edema. Neuro:   Grossly intact.  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  We will go ahead and make sure an ENT referral is in place as she has had adequate therapy with nasal steroid as well as nasal anticholinergic sprays, Singulair and antihistamine use.  She is also performing saline rinses.  She has not gotten much relief with these therapies.  I have recommended that she do a short burst of prednisone if able and cleared by her oncologist.  If not I recommended Afrin use temporarily followed by her nasal steroid spray to get control of congestion. Severe persistent asthma -at this time she remains under good control on her ICS/LABA regimen as well as biologic agent Fasenra LPRD -asymptomatic at this time off antireflux medication Nasal polyposis -continue use of nasal steroid spray Urticaria -has not had any further episodes  1. Continue Symbicort 2 puffs twice a day for asthma maintenance control  2. Continue Fasenra injections every 8 weeks.     3. Use Ipratropium 0.06% 2 sprays each nostril in morning and at bedtime to help with nasal drainage control  4. Continue Singulair and Claritin daily  5. For severe nasal congestion recommend the following:       - prednisone 30mg  daily x 5 days (confirm ok to use with oncologist)       - if prednisone is not ok to take then get OTC Afrin and use Afrin 2 sprays each nostril twice a day for 3-5 days max.  After use wait 5-15 minutes or until you can breath more freely through the nose.  Then use Flonase 2 sprays each nostril twice a day for next 2 weeks.  Once nasal congestion has improved then can decrease Flonase down to once a day use      - can use Ipatropium as above      - continue your nasal rinses and steam treatments and simply saline      - will place ENT referral   6.  If needed:   A.  Ventolin HFA 2 puffs or albuterol nebulization every 4-6 hours  B.  Nasal saline spray  to help moisturize nose     7. Return to clinic in 2-3 months or earlier if problem   I appreciate the opportunity to take part  in Buffy's care. Please do not hesitate to contact me with questions.  Sincerely,   Prudy Feeler, MD Allergy/Immunology [facility]

## 2021-02-06 ENCOUNTER — Encounter: Payer: Self-pay | Admitting: Internal Medicine

## 2021-02-06 NOTE — Assessment & Plan Note (Signed)
With acute flare, for cont'd tx with antibx, and f/u ENT as planned

## 2021-02-06 NOTE — Assessment & Plan Note (Signed)
Encouraged use of cane with walking

## 2021-02-06 NOTE — Assessment & Plan Note (Signed)
Also for referral for outpatient PT

## 2021-02-06 NOTE — Assessment & Plan Note (Signed)
BP Readings from Last 3 Encounters:  02/02/21 120/76  02/01/21 118/70  01/31/21 (!) 162/93   Stable, pt to continue medical treatment norvasc

## 2021-02-08 ENCOUNTER — Other Ambulatory Visit: Payer: Self-pay | Admitting: Internal Medicine

## 2021-02-09 NOTE — Progress Notes (Signed)
Patient is scheduled with Dr Ilda Foil @ [facility] on 02/14/2021 @ 11:30. Patients daughter has been informed.

## 2021-02-10 ENCOUNTER — Ambulatory Visit: Payer: Medicare HMO

## 2021-02-14 ENCOUNTER — Ambulatory Visit (INDEPENDENT_AMBULATORY_CARE_PROVIDER_SITE_OTHER): Payer: Medicare HMO | Admitting: *Deleted

## 2021-02-14 ENCOUNTER — Other Ambulatory Visit: Payer: Self-pay

## 2021-02-14 DIAGNOSIS — J322 Chronic ethmoidal sinusitis: Secondary | ICD-10-CM | POA: Insufficient documentation

## 2021-02-14 DIAGNOSIS — J4550 Severe persistent asthma, uncomplicated: Secondary | ICD-10-CM | POA: Diagnosis not present

## 2021-02-14 DIAGNOSIS — J339 Nasal polyp, unspecified: Secondary | ICD-10-CM | POA: Diagnosis not present

## 2021-02-15 ENCOUNTER — Inpatient Hospital Stay: Payer: Medicare HMO | Attending: Internal Medicine

## 2021-02-15 ENCOUNTER — Encounter: Payer: Self-pay | Admitting: Internal Medicine

## 2021-02-15 DIAGNOSIS — Z95828 Presence of other vascular implants and grafts: Secondary | ICD-10-CM

## 2021-02-15 DIAGNOSIS — Z452 Encounter for adjustment and management of vascular access device: Secondary | ICD-10-CM | POA: Insufficient documentation

## 2021-02-15 DIAGNOSIS — C3432 Malignant neoplasm of lower lobe, left bronchus or lung: Secondary | ICD-10-CM | POA: Diagnosis not present

## 2021-02-15 MED ORDER — SODIUM CHLORIDE 0.9% FLUSH
10.0000 mL | Freq: Once | INTRAVENOUS | Status: AC
  Administered 2021-02-15: 10 mL

## 2021-02-15 MED ORDER — HEPARIN SOD (PORK) LOCK FLUSH 100 UNIT/ML IV SOLN
500.0000 [IU] | Freq: Once | INTRAVENOUS | Status: AC
  Administered 2021-02-15: 500 [IU]

## 2021-02-15 NOTE — Telephone Encounter (Signed)
error 

## 2021-02-20 ENCOUNTER — Ambulatory Visit: Payer: Medicare HMO | Admitting: Internal Medicine

## 2021-02-21 ENCOUNTER — Other Ambulatory Visit: Payer: Self-pay

## 2021-02-21 ENCOUNTER — Ambulatory Visit
Admission: RE | Admit: 2021-02-21 | Discharge: 2021-02-21 | Disposition: A | Discharge status: Home / Self Care | Payer: Medicare HMO | Source: Ambulatory Visit | Attending: Internal Medicine | Admitting: Internal Medicine

## 2021-02-21 DIAGNOSIS — Z1231 Encounter for screening mammogram for malignant neoplasm of breast: Secondary | ICD-10-CM | POA: Diagnosis not present

## 2021-02-21 IMAGING — MG MM DIGITAL SCREENING BILAT W/ TOMO AND CAD
6 of 12 series · 6 of 36 positions shown · non-contrast
Comparison: Previous exam(s).

CLINICAL DATA: Screening.

EXAM:
DIGITAL SCREENING BILATERAL MAMMOGRAM WITH TOMOSYNTHESIS AND CAD
TECHNIQUE: Bilateral screening digital craniocaudal and mediolateral oblique
mammograms were obtained. Bilateral screening digital breast
tomosynthesis was performed. The images were evaluated with
computer-aided detection.

[L MLO synth-2D (1 of 2)]
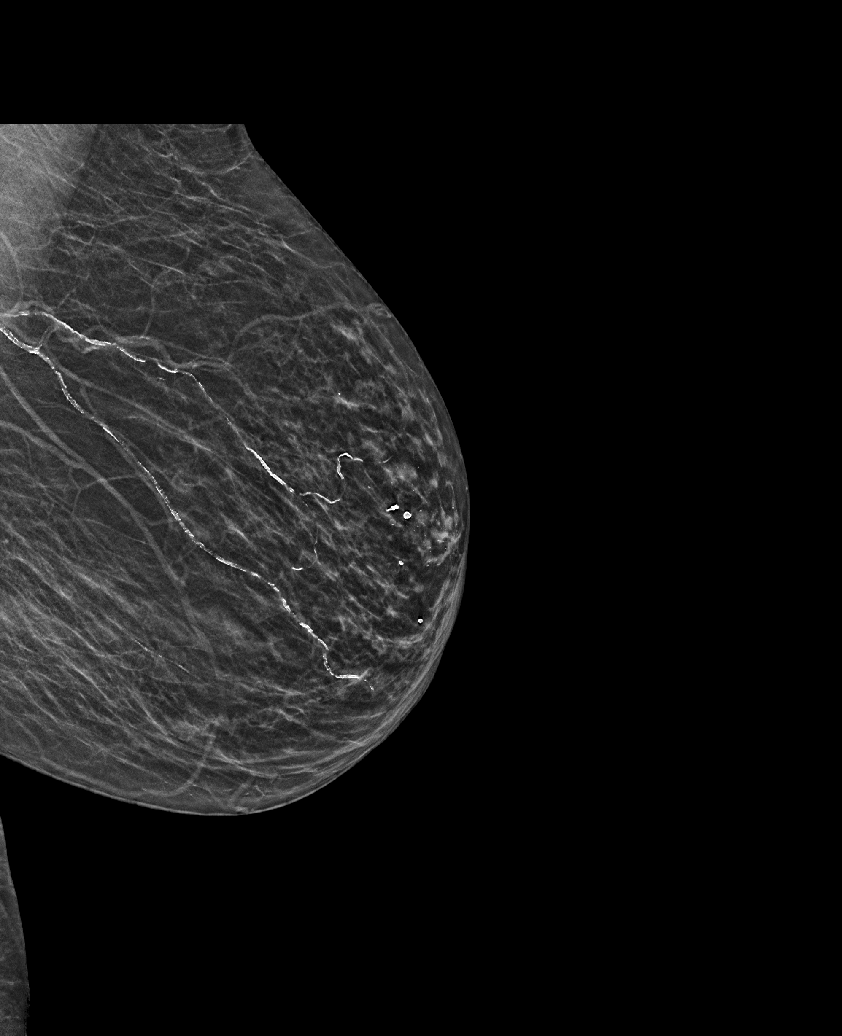

[R MLO synth-2D (1 of 2)]
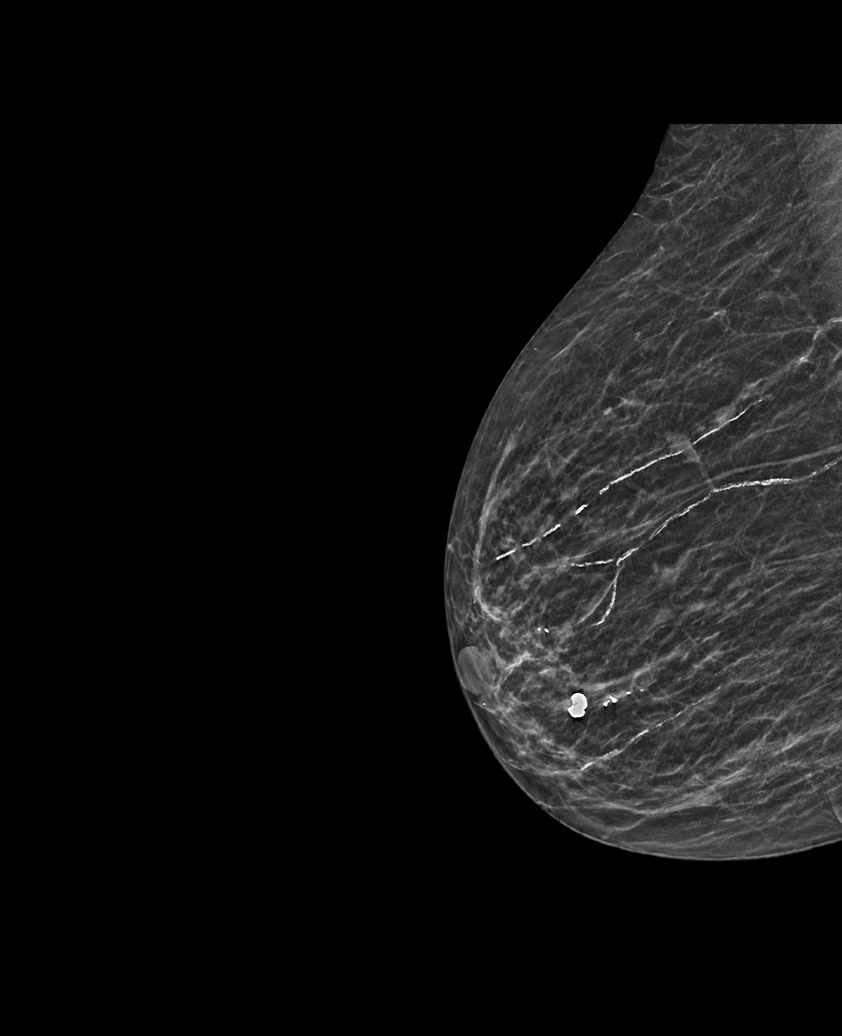

[L CC synth-2D]
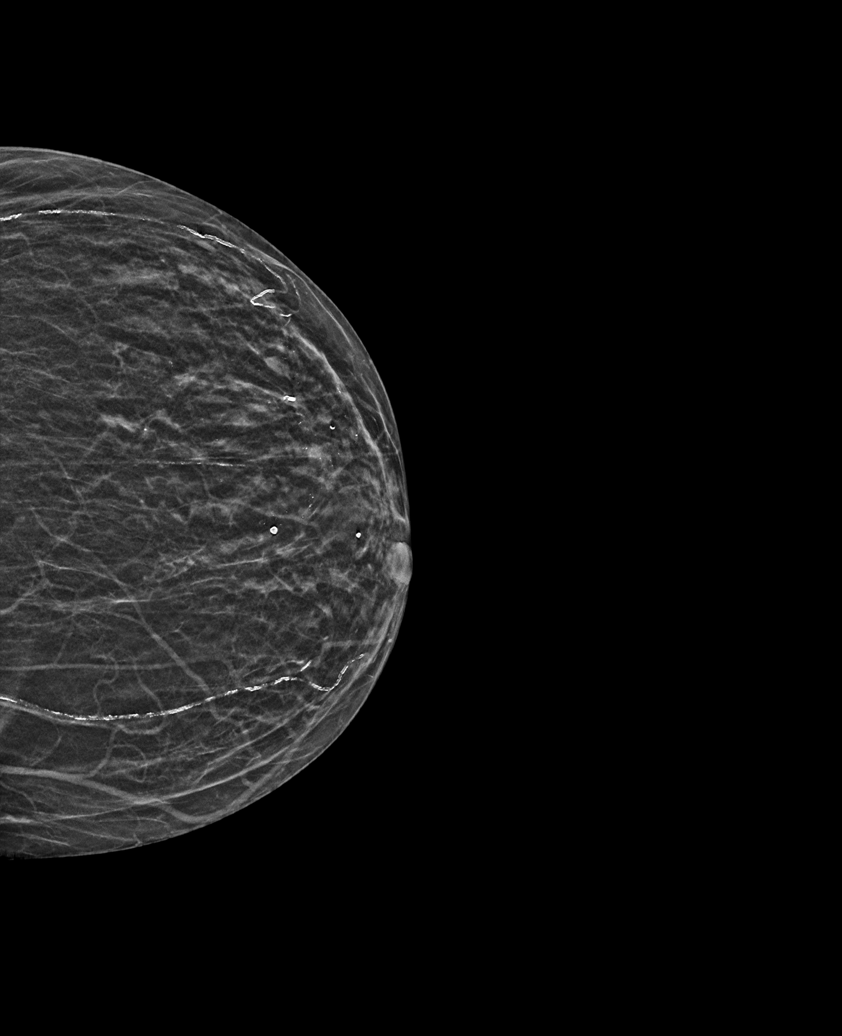

[R CC synth-2D]
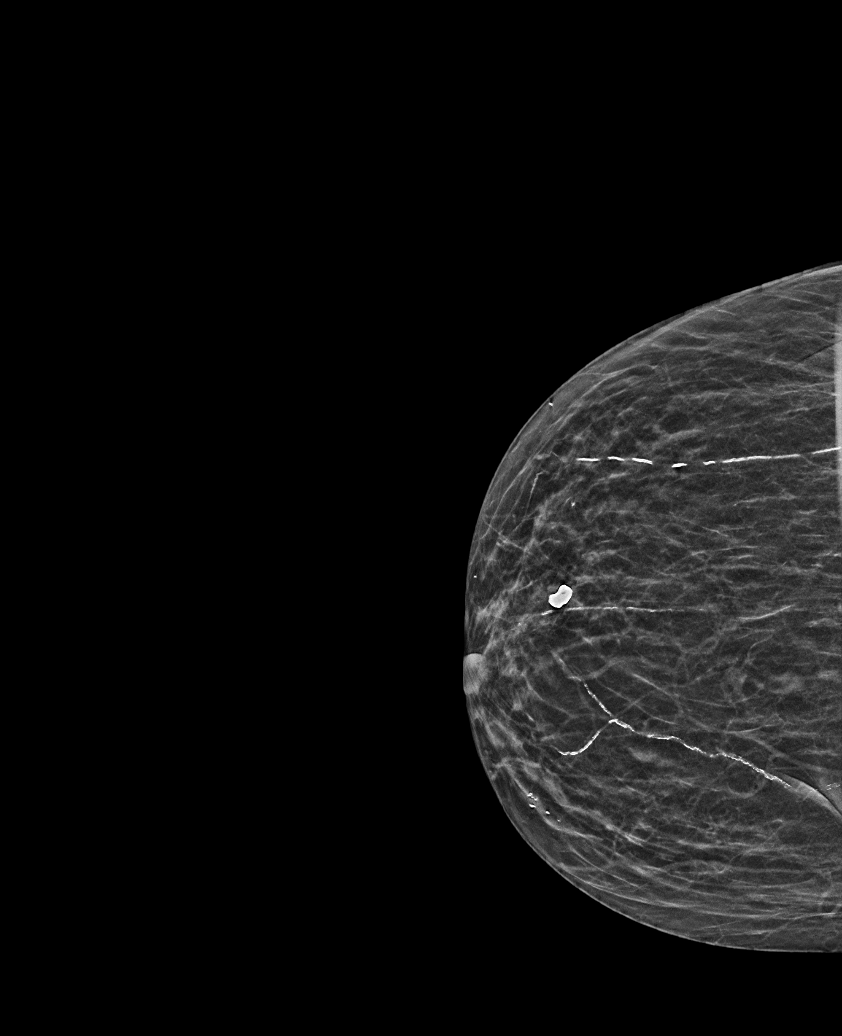

[L MLO synth-2D (2 of 2)]
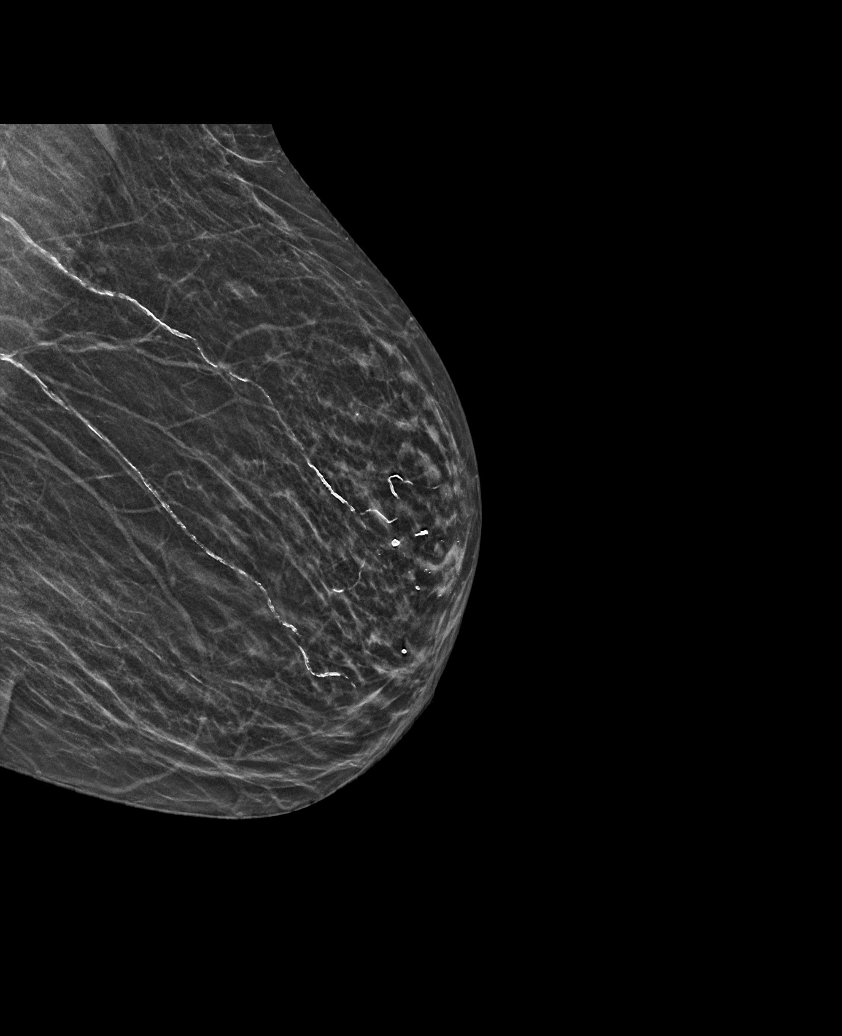

[R MLO synth-2D (2 of 2)]
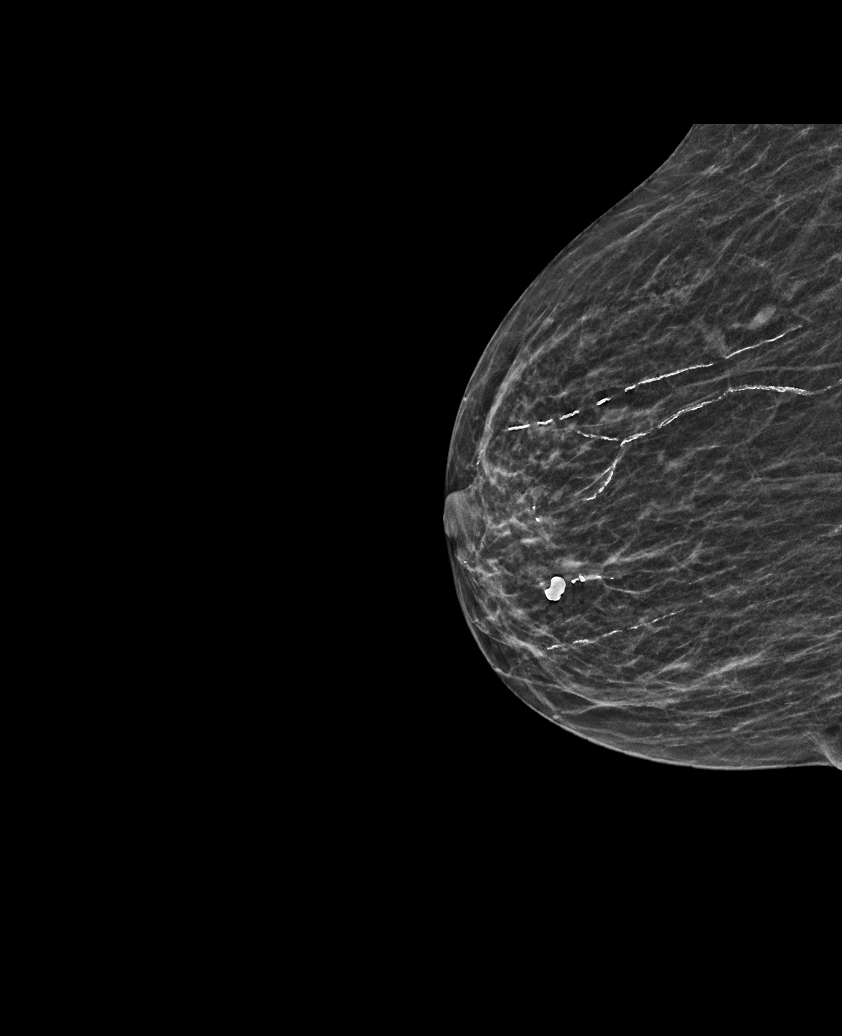

[6 of 36 positions shown; findings below may reference images not displayed]

ACR Breast Density Category b: There are scattered areas of
fibroglandular density.
FINDINGS: There are no findings suspicious for malignancy.
IMPRESSION: No mammographic evidence of malignancy. A result letter of this
screening mammogram will be mailed directly to the patient.

RECOMMENDATION:
Screening mammogram in one year. (Code:[BY])

BI-RADS CATEGORY  1: Negative.

## 2021-02-23 ENCOUNTER — Telehealth: Payer: Self-pay | Admitting: Medical Oncology

## 2021-02-23 NOTE — Telephone Encounter (Signed)
CT scheduled on 10/11 at [facility] imaging. Dtr said she needs an auth for that location.   Message sent to [facility]

## 2021-02-27 ENCOUNTER — Telehealth: Payer: Self-pay | Admitting: Internal Medicine

## 2021-02-27 ENCOUNTER — Inpatient Hospital Stay: Payer: Medicare HMO | Attending: Internal Medicine

## 2021-02-27 ENCOUNTER — Ambulatory Visit (INDEPENDENT_AMBULATORY_CARE_PROVIDER_SITE_OTHER): Payer: Medicare HMO

## 2021-02-27 ENCOUNTER — Other Ambulatory Visit: Payer: Self-pay

## 2021-02-27 DIAGNOSIS — M8580 Other specified disorders of bone density and structure, unspecified site: Secondary | ICD-10-CM | POA: Diagnosis not present

## 2021-02-27 DIAGNOSIS — Z0000 Encounter for general adult medical examination without abnormal findings: Secondary | ICD-10-CM

## 2021-02-27 DIAGNOSIS — C3432 Malignant neoplasm of lower lobe, left bronchus or lung: Secondary | ICD-10-CM | POA: Insufficient documentation

## 2021-02-27 DIAGNOSIS — R591 Generalized enlarged lymph nodes: Secondary | ICD-10-CM | POA: Insufficient documentation

## 2021-02-27 DIAGNOSIS — Z9221 Personal history of antineoplastic chemotherapy: Secondary | ICD-10-CM | POA: Insufficient documentation

## 2021-02-27 DIAGNOSIS — C3492 Malignant neoplasm of unspecified part of left bronchus or lung: Secondary | ICD-10-CM

## 2021-02-27 LAB — CBC WITH DIFFERENTIAL (CANCER CENTER ONLY)
Abs Immature Granulocytes: 0.02 10*3/uL (ref 0.00–0.07)
Basophils Absolute: 0 10*3/uL (ref 0.0–0.1)
Basophils Relative: 0 %
Eosinophils Absolute: 0 10*3/uL (ref 0.0–0.5)
Eosinophils Relative: 0 %
HCT: 41.4 % (ref 36.0–46.0)
Hemoglobin: 14 g/dL (ref 12.0–15.0)
Immature Granulocytes: 0 %
Lymphocytes Relative: 25 %
Lymphs Abs: 1.2 10*3/uL (ref 0.7–4.0)
MCH: 29.9 pg (ref 26.0–34.0)
MCHC: 33.8 g/dL (ref 30.0–36.0)
MCV: 88.5 fL (ref 80.0–100.0)
Monocytes Absolute: 0.4 10*3/uL (ref 0.1–1.0)
Monocytes Relative: 9 %
Neutro Abs: 3 10*3/uL (ref 1.7–7.7)
Neutrophils Relative %: 66 %
Platelet Count: 186 10*3/uL (ref 150–400)
RBC: 4.68 MIL/uL (ref 3.87–5.11)
RDW: 13.8 % (ref 11.5–15.5)
WBC Count: 4.7 10*3/uL (ref 4.0–10.5)
nRBC: 0 % (ref 0.0–0.2)

## 2021-02-27 LAB — CMP (CANCER CENTER ONLY)
ALT: 17 U/L (ref 0–44)
AST: 21 U/L (ref 15–41)
Albumin: 3.8 g/dL (ref 3.5–5.0)
Alkaline Phosphatase: 84 U/L (ref 38–126)
Anion gap: 9 (ref 5–15)
BUN: 9 mg/dL (ref 8–23)
CO2: 25 mmol/L (ref 22–32)
Calcium: 9.8 mg/dL (ref 8.9–10.3)
Chloride: 105 mmol/L (ref 98–111)
Creatinine: 0.87 mg/dL (ref 0.44–1.00)
GFR, Estimated: >60 mL/min (ref >60)
Glucose, Bld: 100 mg/dL — ABNORMAL HIGH (ref 70–99)
Potassium: 3.8 mmol/L (ref 3.5–5.1)
Sodium: 139 mmol/L (ref 135–145)
Total Bilirubin: 0.5 mg/dL (ref 0.3–1.2)
Total Protein: 7.9 g/dL (ref 6.5–8.1)

## 2021-02-27 NOTE — Telephone Encounter (Signed)
Rescheduled upcoming appointment per patient's daughter's request. Patient's daughter is aware of changes.

## 2021-02-27 NOTE — Progress Notes (Addendum)
I connected with Rekha Hobbins today by telephone and verified that I am speaking with the correct person using two identifiers. Location patient: home Location provider: work Persons participating in the virtual visit: patient, Taron ([place]) and provider.   I discussed the limitations, risks, security and privacy concerns of performing an evaluation and management service by telephone and the availability of in person appointments. I also discussed with the patient that there may be a patient responsible charge related to this service. The patient expressed understanding and verbally consented to this telephonic visit.    Interactive audio and video telecommunications were attempted between this provider and patient, however failed, due to patient having technical difficulties OR patient did not have access to video capability.  We continued and completed visit with audio only.  Some vital signs may be absent or patient reported.   Time Spent with patient on telephone encounter: 40 minutes  Subjective:   Angelica [name] is a 80 y.o. female who presents for Medicare Annual (Subsequent) preventive examination.  Review of Systems    Cardiac Risk Factors include: advanced age (>11men, >49 women);dyslipidemia;hypertension;family history of premature cardiovascular disease     Objective:    There were no vitals filed for this visit. There is no height or weight on file to calculate BMI.  Advanced Directives 02/27/2021 01/28/2021 12/26/2020 11/02/2020 11/02/2020 11/02/2020 10/16/2020  Does Patient Have a Medical Advance Directive? Yes No Yes Yes Yes Yes Yes  Type of Advance Directive - Programmer, multimedia of [employer] Power of [place];Living will [place];Living will [place];Living will  Does patient want to make changes to medical advance directive? No - Patient declined - No - Patient declined No - Patient declined No  - Patient declined - -  Copy of [place] in Chart? - - - No - copy requested Yes - validated most recent copy scanned in chart (See row information) Yes - validated most recent copy scanned in chart (See row information) -  Would patient like information on creating a medical advance directive? - - - - No - Patient declined No - Patient declined -    Current Medications (verified) Outpatient Encounter Medications as of 02/27/2021  Medication Sig   acetaminophen (TYLENOL) 325 MG tablet Take 1 tablet (325 mg total) by mouth every 6 (six) hours as needed for headache or mild pain.   alum & mag hydroxide-simeth (MAALOX/MYLANTA) 200-200-20 MG/5ML suspension Take 15 mLs by mouth every 6 (six) hours as needed for indigestion or heartburn.   amLODipine (NORVASC) 5 MG tablet Take 1 tablet (5 mg total) by mouth daily.   amoxicillin-clavulanate (AUGMENTIN) 875-125 MG tablet Take 1 tablet by mouth 2 (two) times daily.   Benralizumab (FASENRA PEN) 30 MG/ML SOAJ Inject into the skin every 8 (eight) weeks.   budesonide-formoterol (SYMBICORT) 80-4.5 MCG/ACT inhaler Inhale 2 puffs into the lungs 2 (two) times daily. Use for asthma flares 2 puffs twice a day for 2 weeks or until cough and wheeze free   Cholecalciferol (VITAMIN D3) 50 MCG (2000 UT) TABS Take 2,000 Units by mouth daily.   diclofenac Sodium (VOLTAREN) 1 % GEL Apply 4 g topically 4 (four) times daily.   doxycycline (VIBRAMYCIN) 100 MG capsule Take 1 capsule (100 mg total) by mouth 2 (two) times daily.   Durvalumab (IMFINZI IV) Inject 1,500 mg into the vein every 28 (twenty-eight) days. Every 4 weeks   EPINEPHrine (EPIPEN 2-PAK) 0.3 mg/0.3  mL IJ SOAJ injection Use as directed for severe allergic reaction   fluticasone (FLONASE) 50 MCG/ACT nasal spray Place 2 sprays into both nostrils daily.   ipratropium (ATROVENT) 0.06 % nasal spray USE 2 SPRAY(S) IN EACH NOSTRIL THREE TIMES DAILY   levofloxacin (LEVAQUIN) 500 MG tablet Take 1  tablet (500 mg total) by mouth daily.   linaclotide (LINZESS) 72 MCG capsule TAKE 1 CAPSULE BY MOUTH ONCE DAILY AS NEEDED (Patient taking differently: Take 72 mcg by mouth daily as needed (constipation).)   loratadine (CLARITIN) 10 MG tablet Take 10 mg by mouth daily.   Magnesium Hydroxide (PHILLIPS MILK OF MAGNESIA PO) Take 15-30 mLs by mouth daily as needed (for constipation).   montelukast (SINGULAIR) 10 MG tablet Take 1 tablet (10 mg total) by mouth at bedtime.   ondansetron (ZOFRAN) 8 MG tablet Take 1 tablet (8 mg total) by mouth every 8 (eight) hours as needed for nausea or vomiting.   polyethylene glycol (MIRALAX / GLYCOLAX) 17 g packet Take 17 g by mouth daily as needed for mild constipation.   pravastatin (PRAVACHOL) 80 MG tablet Take 1 tablet (80 mg total) by mouth daily.   prochlorperazine (COMPAZINE) 10 MG tablet Take 1 tablet (10 mg total) by mouth every 6 (six) hours as needed for nausea or vomiting.   traMADol (ULTRAM) 50 MG tablet TAKE 1 TABLET BY MOUTH EVERY 6 HOURS AS NEEDED FOR MODERATE PAIN   vitamin B-12 (CYANOCOBALAMIN) 1000 MCG tablet Take 1,000 mcg by mouth daily.   Facility-Administered Encounter Medications as of 02/27/2021  Medication   Benralizumab SOSY 30 mg    Allergies (verified) Asa buff (mag [buffered aspirin], Ensure [nutritional supplements], Fish allergy, Other, Peanut-containing drug products, Penicillins, Shellfish-derived products, and Strawberry extract   History: Past Medical History:  Diagnosis Date   Arthritis    Asthma    Bowel obstruction ([place])    Cancer ([place])    Environmental allergies    GERD (gastroesophageal reflux disease)    Glaucoma 05/09/2017   H. pylori infection    History of radiation therapy 07/05/20-08/05/20   Left lung IMRT Dr. Sondra Come    HLD (hyperlipidemia) 01/16/2019   Hypertension    Iron deficiency anemia    Osteopenia 05/26/2017   Thyroid disease    Urticaria 07/25/2018   Past Surgical History:  Procedure Laterality  Date   ABDOMINAL HYSTERECTOMY     BOWEL RESECTION N/A 05/23/2020   Procedure: SMALL BOWEL REPAIR;  Surgeon: Kieth Brightly, Arta Bruce, MD;  Location: [place];  Service: General;  Laterality: N/A;   BREAST BIOPSY     BRONCHIAL BIOPSY  06/10/2020   Procedure: BRONCHIAL BIOPSIES;  Surgeon: Garner Nash, DO;  Location: [place] [facility];  Service: Pulmonary;;   BRONCHIAL BRUSHINGS  06/10/2020   Procedure: BRONCHIAL BRUSHINGS;  Surgeon: Garner Nash, DO;  Location: [place] [facility];  Service: Pulmonary;;   BRONCHIAL NEEDLE ASPIRATION BIOPSY  06/10/2020   Procedure: BRONCHIAL NEEDLE ASPIRATION BIOPSIES;  Surgeon: Garner Nash, DO;  Location: [place] [facility];  Service: Pulmonary;;   BRONCHIAL WASHINGS  06/10/2020   Procedure: BRONCHIAL WASHINGS;  Surgeon: Garner Nash, DO;  Location: [place] [facility];  Service: Pulmonary;;   COLON SURGERY     DIAGNOSTIC LARYNGOSCOPY N/A 05/23/2020   Procedure: ATTEMPTED DIAGNOSTIC LAPAROSCOPY WITH ADHESIONS;  Surgeon: Kieth Brightly, Arta Bruce, MD;  Location: [place];  Service: General;  Laterality: N/A;   IR IMAGING GUIDED PORT INSERTION  07/15/2020   KNEE SURGERY     LAPAROTOMY N/A 04/18/2015  Procedure: Exploratory laparotomy with lysis of adhesions, possible bowel resection;  Surgeon: Ralene Ok, MD;  Location: [place];  Service: General;  Laterality: N/A;   LAPAROTOMY N/A 05/23/2020   Procedure: EXPLORATORY LAPAROTOMY;  Surgeon: Kieth Brightly Arta Bruce, MD;  Location: [place];  Service: General;  Laterality: N/A;   LYSIS OF ADHESION N/A 05/23/2020   Procedure: LYSIS OF ADHESION;  Surgeon: Kieth Brightly Arta Bruce, MD;  Location: [place];  Service: General;  Laterality: N/A;   NASAL SINUS SURGERY     THYROID SURGERY     VIDEO BRONCHOSCOPY WITH ENDOBRONCHIAL NAVIGATION Bilateral 06/10/2020   Procedure: VIDEO BRONCHOSCOPY WITH ENDOBRONCHIAL NAVIGATION;  Surgeon: Garner Nash, DO;  Location: [place];  Service: Pulmonary;  Laterality: Bilateral;   VIDEO BRONCHOSCOPY WITH  ENDOBRONCHIAL ULTRASOUND  06/10/2020   Procedure: VIDEO BRONCHOSCOPY WITH ENDOBRONCHIAL ULTRASOUND;  Surgeon: Garner Nash, DO;  Location: [facility];  Service: Pulmonary;;   Family History  Problem Relation Age of Onset   Diabetes Mother    Hypertension Mother    Hypertension Father    Glaucoma Sister    Glaucoma Brother    Allergic rhinitis Neg Hx    Angioedema Neg Hx    Asthma Neg Hx    Eczema Neg Hx    Immunodeficiency Neg Hx    Urticaria Neg Hx    Social History   Socioeconomic History   Marital status: Widowed    Spouse name: Not on file   Number of children: 4   Years of education: Not on file   Highest education level: Not on file  Occupational History   Occupation: retired  Tobacco Use   Smoking status: Former    Types: Cigarettes   Smokeless tobacco: Never   Tobacco comments:    quit 60 years ago  Vaping Use   Vaping Use: Never used  Substance and Sexual Activity   Alcohol use: No   Drug use: No   Sexual activity: Never  Other Topics Concern   Not on file  Social History Narrative   Not on file   Social Determinants of Health   Financial Resource Strain: Low Risk    Difficulty of Paying Living Expenses: Not hard at all  Food Insecurity: No Food Insecurity   Worried About Charity fundraiser in the Last Year: Never true   [place] in the Last Year: Never true  Transportation Needs: No Transportation Needs   Lack of Transportation (Medical): No   Lack of Transportation (Non-Medical): No  Physical Activity: Sufficiently Active   Days of Exercise per Week: 5 days   Minutes of Exercise per Session: 30 min  Stress: No Stress Concern Present   Feeling of Stress : Not at all  Social Connections: Moderately Integrated   Frequency of Communication with Friends and Family: More than three times a week   Frequency of Social Gatherings with Friends and Family: More than three times a week   Attends Religious Services: 1 to 4 times per year    Active Member of [employer] or Organizations: Yes   Attends Archivist Meetings: 1 to 4 times per year   Marital Status: Widowed    Tobacco Counseling Counseling given: Not Answered Tobacco comments: quit 60 years ago   Clinical Intake:  Pre-visit preparation completed: Yes  Pain : No/denies pain     Nutritional Risks: None Diabetes: No  How often do you need to have someone help you when you read instructions, pamphlets, or  other written materials from your doctor or pharmacy?: 1 - Never What is the last grade level you completed in school?: 7th or 8th grade  Diabetic? no  Interpreter Needed?: No  Information entered by :: Lisette Abu, LPN   Activities of Daily Living In your present state of health, do you have any difficulty performing the following activities: 02/27/2021 11/02/2020  Hearing? N N  Vision? N N  Difficulty concentrating or making decisions? Y N  Walking or climbing stairs? N Y  Dressing or bathing? N N  Doing errands, shopping? Tempie Donning  Preparing Food and eating ? N -  Using the Toilet? N -  In the past six months, have you accidently leaked urine? N -  Do you have problems with loss of bowel control? N -  Managing your Medications? Y -  Managing your Finances? Y -  Housekeeping or managing your Housekeeping? N -  Some recent data might be hidden    Patient Care Team: [name], MD as PCP - General (Internal Medicine) Valrie Hart, RN as Oncology Nurse Navigator (Oncology) Curt Bears, MD as Consulting Physician (Oncology) Knox Royalty, RN as Case Manager Hortencia Pilar, MD as Consulting Physician (Ophthalmology)  Indicate any recent Medical Services you may have received from other than [facility] providers in the past year (date may be approximate).     Assessment:   This is a routine wellness examination for Angelica [name].  Hearing/Vision screen Hearing Screening - Comments:: Patient denied any hearing difficulty.    No hearing aids.  Vision Screening - Comments:: Patient wears readers.  Eye exam done annually by: Marshall Cork, MD.  Dietary issues and exercise activities discussed: Current Exercise Habits: Home exercise routine, Type of exercise: walking, Time (Minutes): 30, Frequency (Times/Week): 5, Weekly Exercise (Minutes/Week): 150, Intensity: Mild, Exercise limited by: respiratory conditions(s);orthopedic condition(s)   Goals Addressed   None   Depression Screen PHQ 2/9 Scores 02/27/2021 12/26/2020 10/13/2020 06/14/2020 05/03/2020 11/20/2019 08/12/2019  PHQ - 2 Score 0 0 0 0 0 0 0  PHQ- 9 Score - - - - - - -    Fall Risk Fall Risk  02/27/2021 12/26/2020 10/13/2020 06/14/2020 05/03/2020  Falls in the past year? 0 0 0 0 0  Number falls in past yr: 0 0 0 - -  Injury with Fall? 0 0 0 - -  Comment - N/A- no falls reported - - -  Risk for fall due to : No Fall Risks No Fall Risks - - -  Follow up Falls evaluation completed Falls prevention discussed - - -    FALL RISK PREVENTION PERTAINING TO THE HOME:  Any stairs in or around the home? No  If so, are there any without handrails? No  Home free of loose throw rugs in walkways, pet beds, electrical cords, etc? Yes  Adequate lighting in your home to reduce risk of falls? Yes   ASSISTIVE DEVICES UTILIZED TO PREVENT FALLS:  Life alert? No  Use of a cane, walker or w/c? No  Grab bars in the bathroom? No  Shower chair or bench in shower? No  Elevated toilet seat or a handicapped toilet? Yes   TIMED UP AND GO:  Was the test performed? No .  Length of time to ambulate 10 feet: n/a sec.   Gait slow and steady without use of assistive device  Cognitive Function: Normal cognitive status assessed by direct observation by this Nurse Health Advisor. No abnormalities found.  Immunizations Immunization History  Administered Date(s) Administered   Fluad Quad(high Dose 65+) 01/16/2019, 03/05/2020, 02/01/2021   Influenza, High Dose  Seasonal PF 01/30/2018   PFIZER(Purple Top)SARS-COV-2 Vaccination 07/13/2019, 08/03/2019, 02/16/2020   Pneumococcal Conjugate-13 11/08/2017    TDAP status: Due, Education has been provided regarding the importance of this vaccine. Advised may receive this vaccine at local pharmacy or Health Dept. Aware to provide a copy of the vaccination record if obtained from local pharmacy or Health Dept. Verbalized acceptance and understanding.  Flu Vaccine status: Up to date  Pneumococcal vaccine status: Due, Education has been provided regarding the importance of this vaccine. Advised may receive this vaccine at local pharmacy or Health Dept. Aware to provide a copy of the vaccination record if obtained from local pharmacy or Health Dept. Verbalized acceptance and understanding.  Covid-19 vaccine status: Completed vaccines  Qualifies for Shingles Vaccine? Yes   Zostavax completed No   Shingrix Completed?: No.    Education has been provided regarding the importance of this vaccine. Patient has been advised to call insurance company to determine out of pocket expense if they have not yet received this vaccine. Advised may also receive vaccine at local pharmacy or Health Dept. Verbalized acceptance and understanding.  Screening Tests Health Maintenance  Topic Date Due   COVID-19 Vaccine (4 - Booster for Pfizer series) 05/10/2020   TETANUS/TDAP  05/03/2021 (Originally 12/23/1959)   Zoster Vaccines- Shingrix (1 of 2) 05/03/2021 (Originally 12/23/1959)   INFLUENZA VACCINE  Completed   DEXA SCAN  Completed   HPV VACCINES  Aged Out    Health Maintenance  Health Maintenance Due  Topic Date Due   COVID-19 Vaccine (4 - Booster for Pfizer series) 05/10/2020    Colorectal cancer screening: No longer required.   Mammogram status: Completed 02/21/2021. Repeat every year  Bone Density status: Completed 05/16/2017. Results reflect: Bone density results: OSTEOPENIA. Repeat every 2-3 years.  Lung Cancer  Screening: (Low Dose CT Chest recommended if Age 79-80 years, 30 pack-year currently smoking OR have quit w/in 15years.) does not qualify.   Lung Cancer Screening Referral: no  Additional Screening:  Hepatitis C Screening: does not qualify; Completed no  Vision Screening: Recommended annual ophthalmology exams for early detection of glaucoma and other disorders of the eye. Is the patient up to date with their annual eye exam?  Yes  Who is the provider or what is the name of the office in which the patient attends annual eye exams? Marshall Cork, MD. If pt is not established with a provider, would they like to be referred to a provider to establish care? No .   Dental Screening: Recommended annual dental exams for proper oral hygiene  Community Resource Referral / Chronic Care Management: CRR required this visit?  No   CCM required this visit?  No      Plan:     I have personally reviewed and noted the following in the patient's chart:   Medical and social history Use of alcohol, tobacco or illicit drugs  Current medications and supplements including opioid prescriptions.  Functional ability and status Nutritional status Physical activity Advanced directives List of other physicians Hospitalizations, surgeries, and ER visits in previous 12 months Vitals Screenings to include cognitive, depression, and falls Referrals and appointments  In addition, I have reviewed and discussed with patient certain preventive protocols, quality metrics, and best practice recommendations. A written personalized care plan for preventive services as well as general preventive health recommendations were provided to patient.  Sheral Flow, LPN   33/83/[id number]   Nurse Notes:  There were no vitals filed for this visit. There is no height or weight on file to calculate BMI. Patient stated that she has no issues with gait or balance; does not use any assistive devices. Hearing  Screening - Comments:: Patient denied any hearing difficulty.   No hearing aids.  Vision Screening - Comments:: Patient wears readers.  Eye exam done annually by: Marshall Cork, MD.    Medical screening examination/treatment/procedure(s) were performed by non-physician practitioner and as supervising physician I was immediately available for consultation/collaboration.  I agree with above. Cathlean Cower, MD

## 2021-02-27 NOTE — Patient Instructions (Signed)
Angelica [name] , Thank you for taking time to come for your Medicare Wellness Visit. I appreciate your ongoing commitment to your health goals. Please review the following plan we discussed and let me know if I can assist you in the future.   Screening recommendations/referrals: Colonoscopy: 02/03/2019; no longer recommended due to age 80: 02/21/2021; due every 1-2 years Bone Density: 05/16/2017; due every 2-3 years Recommended yearly ophthalmology/optometry visit for glaucoma screening and checkup Recommended yearly dental visit for hygiene and checkup  Vaccinations: Influenza vaccine: 02/01/2021 Pneumococcal vaccine: 11/08/2017 Tdap vaccine: never done Shingles vaccine: never done   Covid-19: 07/13/2019, 08/03/2019, 02/16/2020  Advanced directives: Yes; documents on file.  Conditions/risks identified: Yes; No goals at this time.  Next appointment: Please schedule your next Medicare Wellness Visit with your Nurse Health Advisor in 1 year by calling [phone].   Preventive Care 81 Years and Older, Female Preventive care refers to lifestyle choices and visits with your health care provider that can promote health and wellness. What does preventive care include? A yearly physical exam. This is also called an annual well check. Dental exams once or twice a year. Routine eye exams. Ask your health care provider how often you should have your eyes checked. Personal lifestyle choices, including: Daily care of your teeth and gums. Regular physical activity. Eating a healthy diet. Avoiding tobacco and drug use. Limiting alcohol use. Practicing safe sex. Taking low-dose aspirin every day. Taking vitamin and mineral supplements as recommended by your health care provider. What happens during an annual well check? The services and screenings done by your health care provider during your annual well check will depend on your age, overall health, lifestyle risk factors, and family history  of disease. Counseling  Your health care provider may ask you questions about your: Alcohol use. Tobacco use. Drug use. Emotional well-being. Home and relationship well-being. Sexual activity. Eating habits. History of falls. Memory and ability to understand (cognition). Work and work Statistician. Reproductive health. Screening  You may have the following tests or measurements: Height, weight, and BMI. Blood pressure. Lipid and cholesterol levels. These may be checked every 5 years, or more frequently if you are over 67 years old. Skin check. Lung cancer screening. You may have this screening every year starting at age 37 if you have a 30-pack-year history of smoking and currently smoke or have quit within the past 15 years. Fecal occult blood test (FOBT) of the stool. You may have this test every year starting at age 30. Flexible sigmoidoscopy or colonoscopy. You may have a sigmoidoscopy every 5 years or a colonoscopy every 10 years starting at age 42. Hepatitis C blood test. Hepatitis B blood test. Sexually transmitted disease (STD) testing. Diabetes screening. This is done by checking your blood sugar (glucose) after you have not eaten for a while (fasting). You may have this done every 1-3 years. Bone density scan. This is done to screen for osteoporosis. You may have this done starting at age 77. Mammogram. This may be done every 1-2 years. Talk to your health care provider about how often you should have regular mammograms. Talk with your health care provider about your test results, treatment options, and if necessary, the need for more tests. Vaccines  Your health care provider may recommend certain vaccines, such as: Influenza vaccine. This is recommended every year. Tetanus, diphtheria, and acellular pertussis (Tdap, Td) vaccine. You may need a Td booster every 10 years. Zoster vaccine. You may need this after age 81. Pneumococcal  13-valent conjugate (PCV13) vaccine. One  dose is recommended after age 81. Pneumococcal polysaccharide (PPSV23) vaccine. One dose is recommended after age 81. Talk to your health care provider about which screenings and vaccines you need and how often you need them. This information is not intended to replace advice given to you by your health care provider. Make sure you discuss any questions you have with your health care provider. Document Released: 06/03/2015 Document Revised: 01/25/2016 Document Reviewed: 03/08/2015 Elsevier Interactive Patient Education  2017 [employer] [facility] [place] Prevention in the Home Falls can cause injuries. They can happen to people of all ages. There are many things you can do to make your home safe and to help prevent falls. What can I do on the outside of my home? Regularly fix the edges of walkways and driveways and fix any cracks. Remove anything that might make you trip as you walk through a door, such as a raised step or threshold. Trim any bushes or trees on the path to your home. Use bright outdoor lighting. Clear any walking paths of anything that might make someone trip, such as rocks or tools. Regularly check to see if handrails are loose or broken. Make sure that both sides of any steps have handrails. Any raised decks and porches should have guardrails on the edges. Have any leaves, snow, or ice cleared regularly. Use sand or salt on walking paths during winter. Clean up any spills in your garage right away. This includes oil or grease spills. What can I do in the bathroom? Use night lights. Install grab bars by the toilet and in the tub and shower. Do not use towel bars as grab bars. Use non-skid mats or decals in the tub or shower. If you need to sit down in the shower, use a plastic, non-slip stool. Keep the floor dry. Clean up any water that spills on the floor as soon as it happens. Remove soap buildup in the tub or shower regularly. Attach bath mats securely with double-sided  non-slip rug tape. Do not have throw rugs and other things on the floor that can make you trip. What can I do in the bedroom? Use night lights. Make sure that you have a light by your bed that is easy to reach. Do not use any sheets or blankets that are too big for your bed. They should not hang down onto the floor. Have a firm chair that has side arms. You can use this for support while you get dressed. Do not have throw rugs and other things on the floor that can make you trip. What can I do in the kitchen? Clean up any spills right away. Avoid walking on wet floors. Keep items that you use a lot in easy-to-reach places. If you need to reach something above you, use a strong step stool that has a grab bar. Keep electrical cords out of the way. Do not use floor polish or wax that makes floors slippery. If you must use wax, use non-skid floor wax. Do not have throw rugs and other things on the floor that can make you trip. What can I do with my stairs? Do not leave any items on the stairs. Make sure that there are handrails on both sides of the stairs and use them. Fix handrails that are broken or loose. Make sure that handrails are as long as the stairways. Check any carpeting to make sure that it is firmly attached to the stairs. Fix any carpet  that is loose or worn. Avoid having throw rugs at the top or bottom of the stairs. If you do have throw rugs, attach them to the floor with carpet tape. Make sure that you have a light switch at the top of the stairs and the bottom of the stairs. If you do not have them, ask someone to add them for you. What else can I do to help prevent falls? Wear shoes that: Do not have high heels. Have rubber bottoms. Are comfortable and fit you well. Are closed at the toe. Do not wear sandals. If you use a stepladder: Make sure that it is fully opened. Do not climb a closed stepladder. Make sure that both sides of the stepladder are locked into place. Ask  someone to hold it for you, if possible. Clearly mark and make sure that you can see: Any grab bars or handrails. First and last steps. Where the edge of each step is. Use tools that help you move around (mobility aids) if they are needed. These include: Canes. Walkers. Scooters. Crutches. Turn on the lights when you go into a dark area. Replace any light bulbs as soon as they burn out. Set up your furniture so you have a clear path. Avoid moving your furniture around. If any of your floors are uneven, fix them. If there are any pets around you, be aware of where they are. Review your medicines with your doctor. Some medicines can make you feel dizzy. This can increase your chance of falling. Ask your doctor what other things that you can do to help prevent falls. This information is not intended to replace advice given to you by your health care provider. Make sure you discuss any questions you have with your health care provider. Document Released: 03/03/2009 Document Revised: 10/13/2015 Document Reviewed: 06/11/2014 Elsevier Interactive Patient Education  2017 [employer].

## 2021-02-28 ENCOUNTER — Ambulatory Visit
Admission: RE | Admit: 2021-02-28 | Discharge: 2021-02-28 | Disposition: A | Discharge status: Home / Self Care | Payer: Medicare HMO | Source: Ambulatory Visit | Attending: Internal Medicine | Admitting: Internal Medicine

## 2021-02-28 DIAGNOSIS — I700 Atherosclerosis of aorta: Secondary | ICD-10-CM | POA: Diagnosis not present

## 2021-02-28 DIAGNOSIS — I2510 Atherosclerotic heart disease of native coronary artery without angina pectoris: Secondary | ICD-10-CM | POA: Diagnosis not present

## 2021-02-28 DIAGNOSIS — C3490 Malignant neoplasm of unspecified part of unspecified bronchus or lung: Secondary | ICD-10-CM

## 2021-02-28 DIAGNOSIS — Z85118 Personal history of other malignant neoplasm of bronchus and lung: Secondary | ICD-10-CM | POA: Diagnosis not present

## 2021-02-28 MED ORDER — IOPAMIDOL (ISOVUE-300) INJECTION 61%
75.0000 mL | Freq: Once | INTRAVENOUS | Status: AC | PRN
  Administered 2021-02-28: 75 mL via INTRAVENOUS

## 2021-03-01 ENCOUNTER — Ambulatory Visit: Payer: Medicare HMO | Admitting: Pulmonary Disease

## 2021-03-01 ENCOUNTER — Inpatient Hospital Stay: Payer: Medicare HMO | Admitting: Internal Medicine

## 2021-03-01 ENCOUNTER — Ambulatory Visit: Payer: Medicare HMO | Admitting: Internal Medicine

## 2021-03-02 ENCOUNTER — Other Ambulatory Visit: Payer: Self-pay

## 2021-03-02 ENCOUNTER — Encounter: Payer: Self-pay | Admitting: Internal Medicine

## 2021-03-02 ENCOUNTER — Inpatient Hospital Stay (HOSPITAL_BASED_OUTPATIENT_CLINIC_OR_DEPARTMENT_OTHER): Payer: Medicare HMO | Admitting: Internal Medicine

## 2021-03-02 VITALS — BP 137/81 | HR 92 | Temp 97.3°F | Resp 19 | Ht 67.0 in | Wt 149.4 lb

## 2021-03-02 DIAGNOSIS — Z9221 Personal history of antineoplastic chemotherapy: Secondary | ICD-10-CM

## 2021-03-02 DIAGNOSIS — Z85118 Personal history of other malignant neoplasm of bronchus and lung: Secondary | ICD-10-CM

## 2021-03-02 DIAGNOSIS — Z923 Personal history of irradiation: Secondary | ICD-10-CM

## 2021-03-02 DIAGNOSIS — C3492 Malignant neoplasm of unspecified part of left bronchus or lung: Secondary | ICD-10-CM | POA: Diagnosis not present

## 2021-03-02 DIAGNOSIS — R911 Solitary pulmonary nodule: Secondary | ICD-10-CM | POA: Diagnosis not present

## 2021-03-02 DIAGNOSIS — M8580 Other specified disorders of bone density and structure, unspecified site: Secondary | ICD-10-CM | POA: Diagnosis not present

## 2021-03-02 DIAGNOSIS — C3432 Malignant neoplasm of lower lobe, left bronchus or lung: Secondary | ICD-10-CM | POA: Diagnosis not present

## 2021-03-02 DIAGNOSIS — R591 Generalized enlarged lymph nodes: Secondary | ICD-10-CM | POA: Diagnosis not present

## 2021-03-02 DIAGNOSIS — C3490 Malignant neoplasm of unspecified part of unspecified bronchus or lung: Secondary | ICD-10-CM

## 2021-03-02 NOTE — Progress Notes (Signed)
Index [phone]   [phone]  OFFICE PROGRESS NOTE  [name], MD [id number] [id number]  DIAGNOSIS: Stage IIIB (T2 a, N3, M0) non-small cell lung cancer, adenocarcinoma presented with left lower lobe lung mass in addition to left hilar and mediastinal as well as right and left cervical lymphadenopathy diagnosed in January 2022  PRIOR THERAPY:  1) Concurrent chemoradiation with weekly carboplatin for AUC of 2 and paclitaxel 45 mg/M2.  Status post 5 cycles.  Last dose was given August 01, 2020. 2) Consolidation treatment with immunotherapy with Imfinzi 1500 mg IV every 4 weeks.  First dose September 06, 2020.  Status post 3 cycles.  This treatment was discontinued secondary to intolerance and hospital admission with sepsis.  CURRENT THERAPY: Observation.  INTERVAL HISTORY: Angelica [name] 80 y.o. female returns to the clinic today for follow-up visit accompanied by her daughter.  The patient is feeling fine today with no concerning complaints.  She denied having any current chest pain, shortness of breath, cough or hemoptysis.  She has no fatigue or weakness.  She has no nausea, vomiting, diarrhea or constipation.  She has no headache or visual changes.  She has no fever or chills.  The patient had repeat CT scan of the chest performed recently and she is here for evaluation and discussion of her scan results.   MEDICAL HISTORY: Past Medical History:  Diagnosis Date   Arthritis    Asthma    Bowel obstruction (HCC)    Cancer (HCC)    Environmental allergies    GERD (gastroesophageal reflux disease)    Glaucoma 05/09/2017   H. pylori infection    History of radiation therapy 07/05/20-08/05/20   Left lung IMRT Dr. Sondra Come    HLD (hyperlipidemia) 01/16/2019   Hypertension    Iron deficiency anemia    Osteopenia 05/26/2017   Thyroid disease    Urticaria 07/25/2018    ALLERGIES:  is allergic to asa buff (mag [buffered  aspirin], ensure [nutritional supplements], fish allergy, other, peanut-containing drug products, penicillins, shellfish-derived products, and strawberry extract.  MEDICATIONS:  Current Outpatient Medications  Medication Sig Dispense Refill   acetaminophen (TYLENOL) 325 MG tablet Take 1 tablet (325 mg total) by mouth every 6 (six) hours as needed for headache or mild pain.     alum & mag hydroxide-simeth (MAALOX/MYLANTA) 200-200-20 MG/5ML suspension Take 15 mLs by mouth every 6 (six) hours as needed for indigestion or heartburn.     amLODipine (NORVASC) 5 MG tablet Take 1 tablet (5 mg total) by mouth daily. 90 tablet 3   amoxicillin-clavulanate (AUGMENTIN) 875-125 MG tablet Take 1 tablet by mouth 2 (two) times daily. 20 tablet 0   Benralizumab (FASENRA PEN) 30 MG/ML SOAJ Inject into the skin every 8 (eight) weeks.     budesonide-formoterol (SYMBICORT) 80-4.5 MCG/ACT inhaler Inhale 2 puffs into the lungs 2 (two) times daily. Use for asthma flares 2 puffs twice a day for 2 weeks or until cough and wheeze free 10.2 g 5   Cholecalciferol (VITAMIN D3) 50 MCG (2000 UT) TABS Take 2,000 Units by mouth daily.     diclofenac Sodium (VOLTAREN) 1 % GEL Apply 4 g topically 4 (four) times daily. 100 g 0   doxycycline (VIBRAMYCIN) 100 MG capsule Take 1 capsule (100 mg total) by mouth 2 (two) times daily. 20 capsule 0   Durvalumab (IMFINZI IV) Inject 1,500 mg into the vein every 28 (twenty-eight) days. Every  4 weeks     EPINEPHrine (EPIPEN 2-PAK) 0.3 mg/0.3 mL IJ SOAJ injection Use as directed for severe allergic reaction 2 each 1   fluticasone (FLONASE) 50 MCG/ACT nasal spray Place 2 sprays into both nostrils daily. 16 g 5   ipratropium (ATROVENT) 0.06 % nasal spray USE 2 SPRAY(S) IN EACH NOSTRIL THREE TIMES DAILY 15 mL 0   levofloxacin (LEVAQUIN) 500 MG tablet Take 1 tablet (500 mg total) by mouth daily. 10 tablet 0   linaclotide (LINZESS) 72 MCG capsule TAKE 1 CAPSULE BY MOUTH ONCE DAILY AS NEEDED (Patient  taking differently: Take 72 mcg by mouth daily as needed (constipation).) 30 capsule 0   loratadine (CLARITIN) 10 MG tablet Take 10 mg by mouth daily.     Magnesium Hydroxide (PHILLIPS MILK OF MAGNESIA PO) Take 15-30 mLs by mouth daily as needed (for constipation).     montelukast (SINGULAIR) 10 MG tablet Take 1 tablet (10 mg total) by mouth at bedtime. 30 tablet 5   ondansetron (ZOFRAN) 8 MG tablet Take 1 tablet (8 mg total) by mouth every 8 (eight) hours as needed for nausea or vomiting. 20 tablet 0   polyethylene glycol (MIRALAX / GLYCOLAX) 17 g packet Take 17 g by mouth daily as needed for mild constipation.     pravastatin (PRAVACHOL) 80 MG tablet Take 1 tablet (80 mg total) by mouth daily. 90 tablet 3   prochlorperazine (COMPAZINE) 10 MG tablet Take 1 tablet (10 mg total) by mouth every 6 (six) hours as needed for nausea or vomiting. 30 tablet 0   traMADol (ULTRAM) 50 MG tablet TAKE 1 TABLET BY MOUTH EVERY 6 HOURS AS NEEDED FOR MODERATE PAIN 30 tablet 2   vitamin B-12 (CYANOCOBALAMIN) 1000 MCG tablet Take 1,000 mcg by mouth daily.     Current Facility-Administered Medications  Medication Dose Route Frequency Provider Last Rate Last Admin   Benralizumab SOSY 30 mg  30 mg Subcutaneous Q8 Weeks Kennith Gain, MD   30 mg at 02/14/21 1026    SURGICAL HISTORY:  Past Surgical History:  Procedure Laterality Date   ABDOMINAL HYSTERECTOMY     BOWEL RESECTION N/A 05/23/2020   Procedure: SMALL BOWEL REPAIR;  Surgeon: Kieth Brightly, Arta Bruce, MD;  Location: [facility];  Service: General;  Laterality: N/A;   BREAST BIOPSY     BRONCHIAL BIOPSY  06/10/2020   Procedure: BRONCHIAL BIOPSIES;  Surgeon: Garner Nash, DO;  Location: [facility];  Service: Pulmonary;;   BRONCHIAL BRUSHINGS  06/10/2020   Procedure: BRONCHIAL BRUSHINGS;  Surgeon: Garner Nash, DO;  Location: [facility];  Service: Pulmonary;;   BRONCHIAL NEEDLE ASPIRATION BIOPSY  06/10/2020   Procedure: BRONCHIAL NEEDLE ASPIRATION  BIOPSIES;  Surgeon: Garner Nash, DO;  Location: [facility];  Service: Pulmonary;;   BRONCHIAL WASHINGS  06/10/2020   Procedure: BRONCHIAL WASHINGS;  Surgeon: Garner Nash, DO;  Location: [facility];  Service: Pulmonary;;   COLON SURGERY     DIAGNOSTIC LARYNGOSCOPY N/A 05/23/2020   Procedure: ATTEMPTED DIAGNOSTIC LAPAROSCOPY WITH ADHESIONS;  Surgeon: Kieth Brightly, Arta Bruce, MD;  Location: [facility];  Service: General;  Laterality: N/A;   IR IMAGING GUIDED PORT INSERTION  07/15/2020   KNEE SURGERY     LAPAROTOMY N/A 04/18/2015   Procedure: Exploratory laparotomy with lysis of adhesions, possible bowel resection;  Surgeon: Ralene Ok, MD;  Location: [facility];  Service: General;  Laterality: N/A;   LAPAROTOMY N/A 05/23/2020   Procedure: EXPLORATORY LAPAROTOMY;  Surgeon: Mickeal Skinner, MD;  Location:  [facility];  Service: General;  Laterality: N/A;   LYSIS OF ADHESION N/A 05/23/2020   Procedure: LYSIS OF ADHESION;  Surgeon: Kieth Brightly, Arta Bruce, MD;  Location: [facility];  Service: General;  Laterality: N/A;   NASAL SINUS SURGERY     THYROID SURGERY     VIDEO BRONCHOSCOPY WITH ENDOBRONCHIAL NAVIGATION Bilateral 06/10/2020   Procedure: VIDEO BRONCHOSCOPY WITH ENDOBRONCHIAL NAVIGATION;  Surgeon: Garner Nash, DO;  Location: [facility];  Service: Pulmonary;  Laterality: Bilateral;   VIDEO BRONCHOSCOPY WITH ENDOBRONCHIAL ULTRASOUND  06/10/2020   Procedure: VIDEO BRONCHOSCOPY WITH ENDOBRONCHIAL ULTRASOUND;  Surgeon: Garner Nash, DO;  Location: [facility];  Service: Pulmonary;;    REVIEW OF SYSTEMS:  A comprehensive review of systems was negative.   PHYSICAL EXAMINATION: General appearance: alert, cooperative, fatigued, and no distress Head: Normocephalic, without obvious abnormality, atraumatic Neck: no adenopathy, no JVD, supple, symmetrical, trachea midline, and thyroid not enlarged, symmetric, no tenderness/mass/nodules Lymph nodes: Cervical, supraclavicular, and axillary nodes  normal. Resp: clear to auscultation bilaterally Back: symmetric, no curvature. ROM normal. No CVA tenderness. Cardio: regular rate and rhythm, S1, S2 normal, no murmur, click, rub or gallop GI: soft, non-tender; bowel sounds normal; no masses,  no organomegaly Extremities: extremities normal, atraumatic, no cyanosis or edema  ECOG PERFORMANCE STATUS: 1 - Symptomatic but completely ambulatory  Blood pressure 137/81, pulse 92, temperature (!) 97.3 F (36.3 C), temperature source Tympanic, resp. rate 19, height 5\' 7"  (1.702 m), weight 149 lb 6.4 oz (67.8 kg), SpO2 99 %.  LABORATORY DATA: Lab Results  Component Value Date   WBC 4.7 02/27/2021   HGB 14.0 02/27/2021   HCT 41.4 02/27/2021   MCV 88.5 02/27/2021   PLT 186 02/27/2021      Chemistry      Component Value Date/Time   NA 139 02/27/2021 0758   K 3.8 02/27/2021 0758   CL 105 02/27/2021 0758   CO2 25 02/27/2021 0758   BUN 9 02/27/2021 0758   CREATININE 0.87 02/27/2021 0758      Component Value Date/Time   CALCIUM 9.8 02/27/2021 0758   ALKPHOS 84 02/27/2021 0758   AST 21 02/27/2021 0758   ALT 17 02/27/2021 0758   BILITOT 0.5 02/27/2021 0758       RADIOGRAPHIC STUDIES: CT Chest W Contrast  Result Date: 03/01/2021 CLINICAL DATA:  80 year old female with history of non-small cell lung cancer status post radiation therapy and chemotherapy (completed March 2022). Follow-up study. EXAM: CT CHEST WITH CONTRAST TECHNIQUE: Multidetector CT imaging of the chest was performed during intravenous contrast administration. CONTRAST:  42mL ISOVUE-300 IOPAMIDOL (ISOVUE-300) INJECTION 61% COMPARISON:  Chest CT 11/05/2020. FINDINGS: Cardiovascular: Heart size is normal. There is no significant pericardial fluid, thickening or pericardial calcification. There is aortic atherosclerosis, as well as atherosclerosis of the great vessels of the mediastinum and the coronary arteries, including calcified atherosclerotic plaque in the left main,  left anterior descending, left circumflex and right coronary arteries. Right internal jugular single-lumen porta cath with tip terminating at the superior cavoatrial junction. Mediastinum/Nodes: No pathologically enlarged mediastinal or hilar lymph nodes. Esophagus is unremarkable in appearance. No axillary lymphadenopathy. Lungs/Pleura: Compared to the prior examinations there is increasing architectural distortion in the left lower lobe where there is extensive curvilinear nodular architectural distortion which has substantially increased compared to prior examinations. Elsewhere in the lungs there are no suspicious appearing pulmonary nodules or masses. No acute consolidative airspace disease. No pleural effusions. Upper Abdomen: Aortic atherosclerosis. Musculoskeletal: There are no aggressive appearing lytic  or blastic lesions noted in the visualized portions of the skeleton. IMPRESSION: 1. Dramatic change in the appearance of the left lower lobe. Given the timing of radiation therapy, this is favored to reflect developing areas of postradiation mass-like fibrosis. However, given the irregular and nodular contours of this area, close attention on short-term follow-up studies is recommended to exclude the possibility of locally recurrent disease. 2. No mediastinal or hilar lymphadenopathy and no new pulmonary nodules noted elsewhere in the lungs on today's examination. 3. Aortic atherosclerosis, in addition to left main and 3 vessel coronary artery disease. Aortic Atherosclerosis (ICD10-I70.0). Electronically Signed   By: Vinnie [place] M.D.   On: 03/01/2021 17:18   MM 3D SCREEN BREAST BILATERAL  Result Date: 02/24/2021 CLINICAL DATA:  Screening. EXAM: DIGITAL SCREENING BILATERAL MAMMOGRAM WITH TOMOSYNTHESIS AND CAD TECHNIQUE: Bilateral screening digital craniocaudal and mediolateral oblique mammograms were obtained. Bilateral screening digital breast tomosynthesis was performed. The images were evaluated  with computer-aided detection. COMPARISON:  Previous exam(s). ACR Breast Density Category b: There are scattered areas of fibroglandular density. FINDINGS: There are no findings suspicious for malignancy. IMPRESSION: No mammographic evidence of malignancy. A result letter of this screening mammogram will be mailed directly to the patient. RECOMMENDATION: Screening mammogram in one year. (Code:SM-B-01Y) BI-RADS CATEGORY  1: Negative. Electronically Signed   By: Lillia Mountain M.D.   On: 02/24/2021 12:52     ASSESSMENT AND PLAN: This is a very pleasant 80 years old African-American female recently diagnosed with a stage IIIb (T2a, N3, M0) non-small cell lung cancer, adenocarcinoma presented with left lower lobe lung mass in addition to left hilar and mediastinal lymphadenopathy as well as right and left cervical lymphadenopathy diagnosed in January 2022. The patient underwent a course of concurrent chemoradiation with weekly carboplatin and paclitaxel status post 5 cycles with partial response.   The patient underwent consolidation treatment with Imfinzi 1500 Mg IV every 4 weeks status post 3 cycles.  She has been tolerating this treatment well but during the last infusion she developed systemic inflammatory response syndrome with fever and blood work showed bacteremia secondary to Klebsiella pneumonia treated with antibiotic during her hospitalization. The patient has been in observation since that time and she is feeling fine today with no concerning complaints. She had repeat CT scan of the chest performed recently that showed marked change in the appearance of the left lower lobe suspicious to be postradiation related changes but disease recurrence could not be excluded at this point. I discussed the scan results with the patient and her daughter and recommended for her to have repeat CT scan of the chest in 2 months for further evaluation of this lesion and if there is any further increase, we may consider  her for a PET scan and biopsy to rule out any disease progression. The patient and her daughter agreed to the current plan. She was advised to call immediately if she has any other concerning symptoms in the interval. The patient voices understanding of current disease status and treatment options and is in agreement with the current care plan.  All questions were answered. The patient knows to call the clinic with any problems, questions or concerns. We can certainly see the patient much sooner if necessary.  Disclaimer: This note was dictated with voice recognition software. Similar sounding words can inadvertently be transcribed and may not be corrected upon review.

## 2021-03-06 ENCOUNTER — Telehealth: Payer: Self-pay | Admitting: Internal Medicine

## 2021-03-06 NOTE — Telephone Encounter (Signed)
Scheduled follow-up appointments per 10/13 los. Patient's daughter is aware.

## 2021-03-13 ENCOUNTER — Other Ambulatory Visit: Payer: Self-pay | Admitting: Allergy

## 2021-03-15 ENCOUNTER — Ambulatory Visit (INDEPENDENT_AMBULATORY_CARE_PROVIDER_SITE_OTHER): Payer: Medicare HMO | Admitting: *Deleted

## 2021-03-15 DIAGNOSIS — I10 Essential (primary) hypertension: Secondary | ICD-10-CM

## 2021-03-15 DIAGNOSIS — C3492 Malignant neoplasm of unspecified part of left bronchus or lung: Secondary | ICD-10-CM

## 2021-03-15 NOTE — Patient Instructions (Signed)
Visit Information  Angelica [name] and Angelica [name], it was nice talking with you today.   As we discussed today, the phone number for the [facility] is: [phone]  Please let us know if you have difficulty scheduling physical therapy-- the referral was placed by Dr. Jenny Reichmann on February 01, 2021   I look forward to talking to you again for an update on Monday April 17, 2021 at 1:00 pm- please be listening out for my call that day.  I will call as close to 1:00 pm as possible.   If you need to cancel or re-schedule our telephone visit, please call [phone] and one of our care guides will be happy to assist you.   I look forward to hearing about your progress.   Please don't hesitate to contact me if I can be of assistance to you before our next scheduled telephone appointment.   [name], RN, BSN, [facility] RN Care Coordination- [place] [phone]: direct office [phone]: mobile   PATIENT GOALS:  Goals Addressed             This Visit's Progress    Patient Self-Care Activities   On track    Timeframe:  Long-Range Goal Priority:  Medium Start Date:      12/26/20                       Expected End Date:       12/26/21  Patient will self administer medications as prescribed Patient will attend all scheduled provider appointments Patient will call pharmacy for medication refills Patient will call provider office for new concerns or questions Patient will monitor and write down on paper weekly blood pressures at home Patient will continue to follow low-salt heart healthy diet     Patient will contact [place] to follow up on PT referral placed 02/01/21 by Dr. Jenny Reichmann            Heart-Healthy Eating Plan Heart-healthy meal planning includes: Eating less unhealthy fats. Eating more healthy fats. Making other changes in your diet. Talk with your doctor or a diet specialist (dietitian) to  create an eating plan that is right for you. What is my plan? Your doctor may recommend an eating plan that includes: Total fat: ______% or less of total calories a day. Saturated fat: ______% or less of total calories a day. Cholesterol: less than _________mg a day. What are tips for following this plan? Cooking Avoid frying your food. Try to bake, boil, grill, or broil it instead. You can also reduce fat by: Removing the skin from poultry. Removing all visible fats from meats. Steaming vegetables in water or broth. Meal planning  At meals, divide your plate into four equal parts: Fill one-half of your plate with vegetables and green salads. Fill one-fourth of your plate with whole grains. Fill one-fourth of your plate with lean protein foods. Eat 4-5 servings of vegetables per day. A serving of vegetables is: 1 cup of raw or cooked vegetables. 2 cups of raw leafy greens. Eat 4-5 servings of fruit per day. A serving of fruit is: 1 medium whole fruit.  cup of dried fruit.  cup of fresh, frozen, or canned fruit.  cup of 100% fruit juice. Eat more foods that have soluble fiber. These are apples, broccoli, carrots, beans, peas, and barley. Try to get 20-30 g of fiber per day. Eat 4-5 servings of nuts, legumes, and  seeds per week: 1 serving of dried beans or legumes equals  cup after being cooked. 1 serving of nuts is  cup. 1 serving of seeds equals 1 tablespoon. General information Eat more home-cooked food. Eat less restaurant, buffet, and fast food. Limit or avoid alcohol. Limit foods that are high in starch and sugar. Avoid fried foods. Lose weight if you are overweight. Keep track of how much salt (sodium) you eat. This is important if you have high blood pressure. Ask your doctor to tell you more about this. Try to add vegetarian meals each week. Fats Choose healthy fats. These include olive oil and canola oil, flaxseeds, walnuts, almonds, and seeds. Eat more omega-3  fats. These include salmon, mackerel, sardines, tuna, flaxseed oil, and ground flaxseeds. Try to eat fish at least 2 times each week. Check food labels. Avoid foods with trans fats or high amounts of saturated fat. Limit saturated fats. These are often found in animal products, such as meats, butter, and cream. These are also found in plant foods, such as palm oil, palm kernel oil, and coconut oil. Avoid foods with partially hydrogenated oils in them. These have trans fats. Examples are stick margarine, some tub margarines, cookies, crackers, and other baked goods. What foods can I eat? Fruits All fresh, canned (in natural juice), or frozen fruits. Vegetables Fresh or frozen vegetables (raw, steamed, roasted, or grilled). Green salads. Grains Most grains. Choose whole wheat and whole grains most of the time. Rice and pasta, including brown rice and pastas made with whole wheat. Meats and other proteins Lean, well-trimmed beef, veal, pork, and lamb. Chicken and [place] without skin. All fish and shellfish. Wild duck, rabbit, pheasant, and venison. Egg whites or low-cholesterol egg substitutes. Dried beans, peas, lentils, and tofu. Seeds and most nuts. Dairy Low-fat or nonfat cheeses, including ricotta and mozzarella. Skim or 1% milk that is liquid, powdered, or evaporated. Buttermilk that is made with low-fat milk. Nonfat or low-fat yogurt. Fats and oils Non-hydrogenated (trans-free) margarines. Vegetable oils, including soybean, sesame, sunflower, olive, peanut, safflower, corn, canola, and cottonseed. Salad dressings or mayonnaise made with a vegetable oil. Beverages Mineral water. Coffee and tea. Diet carbonated beverages. Sweets and desserts Sherbet, gelatin, and fruit ice. Small amounts of dark chocolate. Limit all sweets and desserts. Seasonings and condiments All seasonings and condiments. The items listed above may not be a complete list of foods and drinks you can eat. Contact a  dietitian for more options. What foods should I avoid? Fruits Canned fruit in heavy syrup. Fruit in cream or butter sauce. Fried fruit. Limit coconut. Vegetables Vegetables cooked in cheese, cream, or butter sauce. Fried vegetables. Grains Breads that are made with saturated or trans fats, oils, or whole milk. Croissants. Sweet rolls. Donuts. High-fat crackers, such as cheese crackers. Meats and other proteins Fatty meats, such as hot dogs, ribs, sausage, bacon, rib-eye roast or steak. High-fat deli meats, such as salami and bologna. Caviar. Domestic duck and goose. Organ meats, such as liver. Dairy Cream, sour cream, cream cheese, and creamed cottage cheese. Whole-milk cheeses. Whole or 2% milk that is liquid, evaporated, or condensed. Whole buttermilk. Cream sauce or high-fat cheese sauce. Yogurt that is made from whole milk. Fats and oils Meat fat, or shortening. Cocoa butter, hydrogenated oils, palm oil, coconut oil, palm kernel oil. Solid fats and shortenings, including bacon fat, salt pork, lard, and butter. Nondairy cream substitutes. Salad dressings with cheese or sour cream. Beverages Regular sodas and juice drinks with added sugar.  Sweets and desserts Frosting. Pudding. Cookies. Cakes. Pies. Milk chocolate or white chocolate. Buttered syrups. Full-fat ice cream or ice cream drinks. The items listed above may not be a complete list of foods and drinks to avoid. Contact a dietitian for more information. Summary Heart-healthy meal planning includes eating less unhealthy fats, eating more healthy fats, and making other changes in your diet. Eat a balanced diet. This includes fruits and vegetables, low-fat or nonfat dairy, lean protein, nuts and legumes, whole grains, and heart-healthy oils and fats. This information is not intended to replace advice given to you by your health care provider. Make sure you discuss any questions you have with your health care provider. Document Revised:  09/15/2020 Document Reviewed: 09/15/2020 Elsevier Patient Education  2022 [place] caregiver verbalizes understanding of instructions provided today and agrees to view in MyChart Telephone follow up appointment with care management team member scheduled for:  Monday April 17, 2021 at 1:00 pm The patient has been provided with contact information for the care management team and has been advised to call with any health related questions or concerns  [name], RN, BSN, [place] [phone]: direct office [phone]: mobile

## 2021-03-15 NOTE — Chronic Care Management (AMB) (Signed)
Chronic Care Management   CCM RN Visit Note  03/15/2021 Name: Angelica [name] MRN: [id number] DOB: 08-18-1940  Subjective: Angelica [name] is a 80 y.o. year old female who is a primary care patient of [name], MD. The care management team was consulted for assistance with disease management and care coordination needs.    Engaged with patient and daughter/ caregiver Angelica [name], on [facility] DPR by telephone for follow up visit in response to provider referral for case management and/or care coordination services.   Consent to Services:  The patient was given information about Chronic Care Management services, agreed to services, and gave verbal consent prior to initiation of services.  Please see initial visit note for detailed documentation.  Patient agreed to services and verbal consent obtained.   Assessment: Review of patient past medical history, allergies, medications, health status, including review of consultants reports, laboratory and other test data, was performed as part of comprehensive evaluation and provision of chronic care management services.   CCM Care Plan Allergies  Allergen Reactions   Asa Buff (Mag [Buffered Aspirin] Other (See Comments)    Excessive sweating   Ensure [Nutritional Supplements]     Or boost - upset stomach    Fish Allergy Other (See Comments)    Unknown reaction, but allergic   Other Other (See Comments)    Gelcaps; gel-containing capsules; extended-release medication - upset stomach    Peanut-Containing Drug Products Other (See Comments)    From allergy test   Penicillins Itching    Has patient had a PCN reaction causing immediate rash, facial/tongue/throat swelling, SOB or lightheadedness with hypotension: No Has patient had a PCN reaction causing severe rash involving mucus membranes or skin necrosis: No Has patient had a PCN reaction that required hospitalization No Has patient had a PCN reaction occurring within the last 10 years: No If all  of the above answers are "NO", then may proceed with Cephalosporin use.   Shellfish-Derived Products Other (See Comments)    From allergy test   Strawberry Extract Other (See Comments)    From allergy test    Outpatient Encounter Medications as of 03/15/2021  Medication Sig Note   acetaminophen (TYLENOL) 325 MG tablet Take 1 tablet (325 mg total) by mouth every 6 (six) hours as needed for headache or mild pain.    alum & mag hydroxide-simeth (MAALOX/MYLANTA) 200-200-20 MG/5ML suspension Take 15 mLs by mouth every 6 (six) hours as needed for indigestion or heartburn.    amLODipine (NORVASC) 5 MG tablet Take 1 tablet (5 mg total) by mouth daily.    Benralizumab (FASENRA PEN) 30 MG/ML SOAJ Inject into the skin every 8 (eight) weeks.    budesonide-formoterol (SYMBICORT) 80-4.5 MCG/ACT inhaler Inhale 2 puffs into the lungs 2 (two) times daily. Use for asthma flares 2 puffs twice a day for 2 weeks or until cough and wheeze free    Cholecalciferol (VITAMIN D3) 50 MCG (2000 UT) TABS Take 2,000 Units by mouth daily.    diclofenac Sodium (VOLTAREN) 1 % GEL Apply 4 g topically 4 (four) times daily.    Durvalumab (IMFINZI IV) Inject 1,500 mg into the vein every 28 (twenty-eight) days. Every 4 weeks 11/02/2020: Had reaction today   EPINEPHrine (EPIPEN 2-PAK) 0.3 mg/0.3 mL IJ SOAJ injection Use as directed for severe allergic reaction    fluticasone (FLONASE) 50 MCG/ACT nasal spray Place 2 sprays into both nostrils daily.    ipratropium (ATROVENT) 0.06 % nasal spray USE 2 SPRAY(S) IN [facility]  NOSTRIL THREE TIMES DAILY    levofloxacin (LEVAQUIN) 500 MG tablet Take 1 tablet (500 mg total) by mouth daily.    linaclotide (LINZESS) 72 MCG capsule TAKE 1 CAPSULE BY MOUTH ONCE DAILY AS NEEDED (Patient taking differently: Take 72 mcg by mouth daily as needed (constipation).) 12/26/2020: 12/26/20- patient reports has not needed in several months- year   loratadine (CLARITIN) 10 MG tablet Take 10 mg by mouth daily.     Magnesium Hydroxide (PHILLIPS MILK OF MAGNESIA PO) Take 15-30 mLs by mouth daily as needed (for constipation).    montelukast (SINGULAIR) 10 MG tablet Take 1 tablet (10 mg total) by mouth at bedtime.    ondansetron (ZOFRAN) 8 MG tablet Take 1 tablet (8 mg total) by mouth every 8 (eight) hours as needed for nausea or vomiting. 11/02/2020: New Rx   polyethylene glycol (MIRALAX / GLYCOLAX) 17 g packet Take 17 g by mouth daily as needed for mild constipation.    pravastatin (PRAVACHOL) 80 MG tablet Take 1 tablet (80 mg total) by mouth daily.    prochlorperazine (COMPAZINE) 10 MG tablet Take 1 tablet (10 mg total) by mouth every 6 (six) hours as needed for nausea or vomiting. 12/26/2020: 12/26/20: Patient reports has not needed recently   traMADol (ULTRAM) 50 MG tablet TAKE 1 TABLET BY MOUTH EVERY 6 HOURS AS NEEDED FOR MODERATE PAIN    vitamin B-12 (CYANOCOBALAMIN) 1000 MCG tablet Take 1,000 mcg by mouth daily.    Facility-Administered Encounter Medications as of 03/15/2021  Medication   Benralizumab SOSY 30 mg   Patient Active Problem List   Diagnosis Date Noted   Debility 02/01/2021   Gait disorder 02/01/2021   Malignant neoplasm of bronchus and lung ([place])    Port-A-Cath in place 11/02/2020   Adenocarcinoma of left lung, stage 3 ([place]) 06/22/2020   Lung mass    Pulmonary nodule 05/17/2020   Iron deficiency 05/03/2020   Swelling of lower extremity 07/30/2019   Ganglion cyst of right foot 01/16/2019   HLD (hyperlipidemia) 01/16/2019   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 pylori infection    Other allergic rhinitis 03/20/2016   Malnutrition of moderate degree 04/11/2015   HTN (hypertension) 04/10/2015   Conditions to be addressed/monitored:   HTN and lung cancer  Care Plan : RN Care Manager Plan of Care  Updates made by Knox Royalty, RN since 03/15/2021 12:00 AM     Problem: Chronic Disease Management Needs   Priority: Medium     Long-Range Goal: Development of plan of care for long term chronic disease management   Start Date: 12/26/2020  Expected End Date: 12/26/2021  Priority: Medium  Note:   Current Barriers:  Chronic Disease Management support and education needs related to HTN and lung cancer  RNCM Clinical Goal(s):  Patient will demonstrate ongoing adherence to prescribed treatment plan for HTN and cancer as evidenced by adherence to prescribed medication regimen contacting provider for new or worsened symptoms or questions attending all scheduled provider appointments, monitoring blood pressure at home weekly  through collaboration with RN Care manager, provider, and care team.   Interventions: 1:1 collaboration with primary care provider regarding development and update of comprehensive plan of care as evidenced by provider attestation and co-signature Inter-disciplinary care team collaboration (see longitudinal plan  of care) Evaluation of current treatment plan related to  self management and patient's adherence to plan as established by provider;  Hypertension: (Status: Goal on track: YES.) Last practice recorded BP readings:  BP Readings from Last 3 Encounters:  11/30/20 (!) 149/84  11/14/20 138/76  11/08/20 135/72  Most recent eGFR/CrCl: No results found for: EGFR  No components found for: CRCL  Evaluation of current treatment plan related to hypertension self management and patient's adherence to plan as established by provider;   Reviewed prescribed diet low salt, heart healthy Discussed plans with patient for ongoing care management follow up and provided patient with direct contact information for care management team; Discussed current clinical condition: patient reports "doing well;" they deny  clinical concerns, issues, problems Confirmed no medication concerns Reiterated previously provided education around signs/ symptoms UTI, UTI prevention through maintaining hydration: they deny signs/ symptoms UTI; patient trying to stay hydrated Reviewed recent ED visits: report patient took antibiotics as ordered; doses now complete- sinusitis appears to be resolved; reviewed ENT appointment 02/02/21 Reviewed recent PCP office visit on 02/01/21: caregiver reports they have not heard back from neuro-rehabilitation around ambulatory PT: I confirmed referral is in place dated 02/01/21 and provided contact[facility]; daughter will call to follow up on referral status and will let PCP office know if subsequent assistance is needed Confirmed no new/ recent falls: patient not currently using assistive devices- fall prevention measures reiterated/ reinforced Reviewed recent blood pressures at home: monitoring/ recording weekly: report "all in good range;" states "they are running at 118-124/60-80" Confirmed patient eating well, no issues around appetite; staying as active as possible Confirmed patient received flu shot at time of PCP office visit 02/01/21  Lung Cancer  (Status: Goal on track: YES.) Evaluation of current treatment plan related to  lung cancer ,  self-management and patient's adherence to plan as established by provider. Discussed plans with patient for ongoing care management follow up and provided patient with direct contact information for care management team Reviewed recent oncology provider office visit 03/01/21- verbalize good understanding of plan of care; CT csan has been scheduled for December; daughter will transport Reviewed scheduled/upcoming provider appointments including: 03/16/21- pulmonary provider; 03/29/21: port flush; 04/21/21- allergy provider; 04/26/21- CT scan/ chest: confirmed daughter continues to provide transportation to all  provider appointments  Discussed plans with patient for ongoing care management follow up and provided patient with direct contact information for care management team  Patient Goals/Self-Care Activities: Patient will self administer medications as prescribed Patient will attend all scheduled provider appointments Patient will call pharmacy for medication refills Patient will call provider office for new concerns or questions Patient will monitor and write down on paper weekly blood pressures at home Patient will continue to follow low-salt heart healthy diet     Patient will contact [place] to follow up on PT referral placed 02/01/21 by Dr. Jenny Reichmann        Plan: Telephone follow up appointment with care management team member scheduled for:  Monday April 17, 2021 at 1:00 pm The patient has been provided with contact information for the care management team and has been advised to call with any health related questions or concerns  [name], RN, BSN, [place] [phone]: direct office [phone]: mobile

## 2021-03-16 ENCOUNTER — Ambulatory Visit (INDEPENDENT_AMBULATORY_CARE_PROVIDER_SITE_OTHER): Payer: Medicare HMO | Admitting: Pulmonary Disease

## 2021-03-16 ENCOUNTER — Other Ambulatory Visit: Payer: Self-pay

## 2021-03-16 ENCOUNTER — Encounter: Payer: Self-pay | Admitting: Pulmonary Disease

## 2021-03-16 VITALS — BP 134/78 | HR 92 | Temp 98.0°F | Ht 65.0 in | Wt 152.2 lb

## 2021-03-16 DIAGNOSIS — R599 Enlarged lymph nodes, unspecified: Secondary | ICD-10-CM | POA: Diagnosis not present

## 2021-03-16 DIAGNOSIS — Z8616 Personal history of COVID-19: Secondary | ICD-10-CM

## 2021-03-16 DIAGNOSIS — J4550 Severe persistent asthma, uncomplicated: Secondary | ICD-10-CM | POA: Diagnosis not present

## 2021-03-16 DIAGNOSIS — C3492 Malignant neoplasm of unspecified part of left bronchus or lung: Secondary | ICD-10-CM

## 2021-03-16 DIAGNOSIS — Z923 Personal history of irradiation: Secondary | ICD-10-CM

## 2021-03-16 NOTE — Patient Instructions (Signed)
Thank you for visiting Dr. Valeta Harms at [facility]. Today we recommend the following:  Call us if anything changes If Dr. [name] needs [place] he will reach out.   Return in about 6 months (around 09/14/2021), or if symptoms worsen or fail to improve, for with APP or Dr. Valeta Harms.    Please do your part to reduce the spread of COVID-19.

## 2021-03-16 NOTE — Progress Notes (Signed)
Synopsis: Referred in January 2022 for lung mass by [name], MD  Subjective:   PATIENT ID: Angelica [name] GENDER: female DOB: Sep 15, 1940, MRN: [id number]  Chief Complaint  Patient presents with   Follow-up    This is an 80 year old female with a past medical history of bowel obstruction, hyperlipidemia, hypertension, thyroid disease, asthma, allergies on Fasenra [facility].  Patient had a recent hospitalization, found to be COVID-positive, both vaccines plus booster.  She had a small bowel obstruction with abdominal pain nausea vomiting, incidental COVID infection.  Seen by general surgery underwent laparoscopy with lysis of adhesions during hospitalization had to be started on TPN.  Ultimately was discharged home.  During this evaluation patient found to have a left-sided lower lobe lung mass with associated mediastinal adenopathy.  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  Patient was referred at the time of discharge from the hospital for evaluation by pulmonary medicine for consideration of tissue diagnosis and biopsy after recovery from COVID-19.  At this time patient has no significant respiratory complaints has been doing well at home.  Patient accompanied today in the office by patient's daughter.  They are in agreements for consideration for tissue biopsy and bronchoscopy.  Patient denies fevers chills, denies hemoptysis.  She has been losing weight she does have poor appetite.  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  She has severe asthma at baseline on Symbicort plus Fasenra.  Respiratory status at this time relatively stable.  Bronchoscopy went well.  She has completed her chemotherapy cycle plus radiation.  Office  note from 08/08/2020, Dr. Earlie Server reviewed today in clinic.  Patient's weight has been stable.  Eating well.   OV 03/16/2021: Doing well at this time.  No significant respiratory changes.  Follows with Dr. Earlie Server.  Had recent CT imaging that revealed improvement in the lower lobe mass.  Not on immunotherapy at this time after admission for infection.  No significant breathing complaints today.   Past Medical History:  Diagnosis Date   Arthritis    Asthma    Bowel obstruction (HCC)    Cancer (HCC)    Environmental allergies    GERD (gastroesophageal reflux disease)    Glaucoma 05/09/2017   H. pylori infection    History of radiation therapy 07/05/20-08/05/20   Left lung IMRT Dr. Sondra Come    HLD (hyperlipidemia) 01/16/2019   Hypertension    Iron deficiency anemia    Osteopenia 05/26/2017   Thyroid disease    Urticaria 07/25/2018     Family History  Problem Relation Age of Onset   Diabetes Mother    Hypertension Mother    Hypertension Father    Glaucoma Sister    Glaucoma Brother    Allergic rhinitis Neg Hx    Angioedema Neg Hx    Asthma Neg Hx    Eczema Neg Hx    Immunodeficiency Neg Hx    Urticaria Neg Hx      Past Surgical History:  Procedure Laterality Date   ABDOMINAL HYSTERECTOMY     BOWEL RESECTION N/A 05/23/2020   Procedure: SMALL BOWEL REPAIR;  Surgeon: Kieth Brightly, Arta Bruce, MD;  Location: [facility];  Service: General;  Laterality: N/A;   BREAST BIOPSY     BRONCHIAL BIOPSY  06/10/2020  Procedure: BRONCHIAL BIOPSIES;  Surgeon: Garner Nash, DO;  Location: [facility];  Service: Pulmonary;;   BRONCHIAL BRUSHINGS  06/10/2020   Procedure: BRONCHIAL BRUSHINGS;  Surgeon: Garner Nash, DO;  Location: [place];  Service: Pulmonary;;   BRONCHIAL NEEDLE ASPIRATION BIOPSY  06/10/2020   Procedure: BRONCHIAL NEEDLE ASPIRATION BIOPSIES;  Surgeon: Garner Nash, DO;  Location: [facility];  Service: Pulmonary;;   BRONCHIAL WASHINGS  06/10/2020   Procedure: BRONCHIAL  WASHINGS;  Surgeon: Garner Nash, DO;  Location: [facility];  Service: Pulmonary;;   COLON SURGERY     DIAGNOSTIC LARYNGOSCOPY N/A 05/23/2020   Procedure: ATTEMPTED DIAGNOSTIC LAPAROSCOPY WITH ADHESIONS;  Surgeon: Kieth Brightly, Arta Bruce, MD;  Location: [facility] [facility];  Service: General;  Laterality: N/A;   IR IMAGING GUIDED PORT INSERTION  07/15/2020   KNEE SURGERY     LAPAROTOMY N/A 04/18/2015   Procedure: Exploratory laparotomy with lysis of adhesions, possible bowel resection;  Surgeon: Ralene Ok, MD;  Location: [facility];  Service: General;  Laterality: N/A;   LAPAROTOMY N/A 05/23/2020   Procedure: EXPLORATORY LAPAROTOMY;  Surgeon: Mickeal Skinner, MD;  Location: [facility];  Service: General;  Laterality: N/A;   LYSIS OF ADHESION N/A 05/23/2020   Procedure: LYSIS OF ADHESION;  Surgeon: Kieth Brightly Arta Bruce, MD;  Location: [facility];  Service: General;  Laterality: N/A;   NASAL SINUS SURGERY     THYROID SURGERY     VIDEO BRONCHOSCOPY WITH ENDOBRONCHIAL NAVIGATION Bilateral 06/10/2020   Procedure: VIDEO BRONCHOSCOPY WITH ENDOBRONCHIAL NAVIGATION;  Surgeon: Garner Nash, DO;  Location: [place];  Service: Pulmonary;  Laterality: Bilateral;   VIDEO BRONCHOSCOPY WITH ENDOBRONCHIAL ULTRASOUND  06/10/2020   Procedure: VIDEO BRONCHOSCOPY WITH ENDOBRONCHIAL ULTRASOUND;  Surgeon: Garner Nash, DO;  Location: [facility];  Service: Pulmonary;;    Social History   Socioeconomic History   Marital status: Widowed    Spouse name: Not on file   Number of children: 4   Years of education: Not on file   Highest education level: Not on file  Occupational History   Occupation: retired  Tobacco Use   Smoking status: Former    Types: Cigarettes   Smokeless tobacco: Never   Tobacco comments:    quit 60 years ago  Vaping Use   Vaping Use: Never used  Substance and Sexual Activity   Alcohol use: No   Drug use: No   Sexual activity: Never  Other Topics Concern   Not on file  Social History  Narrative   Not on file   Social Determinants of Health   Financial Resource Strain: Low Risk    Difficulty of Paying Living Expenses: Not hard at all  Food Insecurity: No Food Insecurity   Worried About Charity fundraiser in the Last Year: Never true   Retreat in the Last Year: Never true  Transportation Needs: No Transportation Needs   Lack of Transportation (Medical): No   Lack of Transportation (Non-Medical): No  Physical Activity: Sufficiently Active   Days of Exercise per Week: 5 days   Minutes of Exercise per Session: 30 min  Stress: No Stress Concern Present   Feeling of Stress : Not at all  Social Connections: Moderately Integrated   Frequency of Communication with Friends and Family: More than three times a week   Frequency of Social Gatherings with Friends and Family: More than three times a week   Attends Religious Services: 1 to 4 times per year   Active  Member of Clubs or Organizations: Yes   Attends Archivist Meetings: 1 to 4 times per year   Marital Status: Widowed  Human resources officer Violence: Not At Risk   Fear of Current or Ex-Partner: No   Emotionally Abused: No   Physically Abused: No   Sexually Abused: No     Allergies  Allergen Reactions   Asa Buff (Mag [Buffered Aspirin] Other (See Comments)    Excessive sweating   Ensure [Nutritional Supplements]     Or boost - upset stomach    Fish Allergy Other (See Comments)    Unknown reaction, but allergic   Other Other (See Comments)    Gelcaps; gel-containing capsules; extended-release medication - upset stomach    Peanut-Containing Drug Products Other (See Comments)    From allergy test   Penicillins Itching    Has patient had a PCN reaction causing immediate rash, facial/tongue/throat swelling, SOB or lightheadedness with hypotension: No Has patient had a PCN reaction causing severe rash involving mucus membranes or skin necrosis: No Has patient had a PCN reaction that required  hospitalization No Has patient had a PCN reaction occurring within the last 10 years: No If all of the above answers are "NO", then may proceed with Cephalosporin use.   Shellfish-Derived Products Other (See Comments)    From allergy test   Strawberry Extract Other (See Comments)    From allergy test      Outpatient Medications Prior to Visit  Medication Sig Dispense Refill   acetaminophen (TYLENOL) 325 MG tablet Take 1 tablet (325 mg total) by mouth every 6 (six) hours as needed for headache or mild pain.     alum & mag hydroxide-simeth (MAALOX/MYLANTA) 200-200-20 MG/5ML suspension Take 15 mLs by mouth every 6 (six) hours as needed for indigestion or heartburn.     amLODipine (NORVASC) 5 MG tablet Take 1 tablet (5 mg total) by mouth daily. 90 tablet 3   Benralizumab (FASENRA PEN) 30 MG/ML SOAJ Inject into the skin every 8 (eight) weeks.     budesonide-formoterol (SYMBICORT) 80-4.5 MCG/ACT inhaler Inhale 2 puffs into the lungs 2 (two) times daily. Use for asthma flares 2 puffs twice a day for 2 weeks or until cough and wheeze free 10.2 g 5   Cholecalciferol (VITAMIN D3) 50 MCG (2000 UT) TABS Take 2,000 Units by mouth daily.     diclofenac Sodium (VOLTAREN) 1 % GEL Apply 4 g topically 4 (four) times daily. 100 g 0   EPINEPHrine (EPIPEN 2-PAK) 0.3 mg/0.3 mL IJ SOAJ injection Use as directed for severe allergic reaction 2 each 1   fluticasone (FLONASE) 50 MCG/ACT nasal spray Place 2 sprays into both nostrils daily. 16 g 5   ipratropium (ATROVENT) 0.06 % nasal spray USE 2 SPRAY(S) IN EACH NOSTRIL THREE TIMES DAILY 15 mL 5   linaclotide (LINZESS) 72 MCG capsule TAKE 1 CAPSULE BY MOUTH ONCE DAILY AS NEEDED (Patient taking differently: Take 72 mcg by mouth daily as needed (constipation).) 30 capsule 0   loratadine (CLARITIN) 10 MG tablet Take 10 mg by mouth daily.     Magnesium Hydroxide (PHILLIPS MILK OF MAGNESIA PO) Take 15-30 mLs by mouth daily as needed (for constipation).     montelukast  (SINGULAIR) 10 MG tablet Take 1 tablet (10 mg total) by mouth at bedtime. 30 tablet 5   ondansetron (ZOFRAN) 8 MG tablet Take 1 tablet (8 mg total) by mouth every 8 (eight) hours as needed for nausea or vomiting. 20 tablet 0  polyethylene glycol (MIRALAX / GLYCOLAX) 17 g packet Take 17 g by mouth daily as needed for mild constipation.     pravastatin (PRAVACHOL) 80 MG tablet Take 1 tablet (80 mg total) by mouth daily. 90 tablet 3   prochlorperazine (COMPAZINE) 10 MG tablet Take 1 tablet (10 mg total) by mouth every 6 (six) hours as needed for nausea or vomiting. 30 tablet 0   traMADol (ULTRAM) 50 MG tablet TAKE 1 TABLET BY MOUTH EVERY 6 HOURS AS NEEDED FOR MODERATE PAIN 30 tablet 2   vitamin B-12 (CYANOCOBALAMIN) 1000 MCG tablet Take 1,000 mcg by mouth daily.     Durvalumab (IMFINZI IV) Inject 1,500 mg into the vein every 28 (twenty-eight) days. Every 4 weeks (Patient not taking: Reported on 03/16/2021)     levofloxacin (LEVAQUIN) 500 MG tablet Take 1 tablet (500 mg total) by mouth daily. (Patient not taking: Reported on 03/16/2021) 10 tablet 0   Facility-Administered Medications Prior to Visit  Medication Dose Route Frequency Provider Last Rate Last Admin   Benralizumab SOSY 30 mg  30 mg Subcutaneous Q8 Weeks Kennith Gain, MD   30 mg at 02/14/21 1026    Review of Systems  Constitutional:  Positive for malaise/fatigue. Negative for chills, fever and weight loss.  HENT:  Negative for hearing loss, sore throat and tinnitus.   Eyes:  Negative for blurred vision and double vision.  Respiratory:  Negative for cough, hemoptysis, sputum production, shortness of breath, wheezing and stridor.   Cardiovascular:  Negative for chest pain, palpitations, orthopnea, leg swelling and PND.  Gastrointestinal:  Negative for abdominal pain, constipation, diarrhea, heartburn, nausea and vomiting.  Genitourinary:  Negative for dysuria, hematuria and urgency.  Musculoskeletal:  Negative for joint pain  and myalgias.  Skin:  Negative for itching and rash.  Neurological:  Negative for dizziness, tingling, weakness and headaches.  Endo/Heme/Allergies:  Negative for environmental allergies. Does not bruise/bleed easily.  Psychiatric/Behavioral:  Negative for depression. The patient is not nervous/anxious and does not have insomnia.   All other systems reviewed and are negative.   Objective:  Physical Exam Vitals reviewed.  Constitutional:      General: She is not in acute distress.    Appearance: She is well-developed.  HENT:     Head: Normocephalic and atraumatic.  Eyes:     General: No scleral icterus.    Conjunctiva/sclera: Conjunctivae normal.     Pupils: Pupils are equal, round, and reactive to light.  Neck:     Vascular: No JVD.     Trachea: No tracheal deviation.  Cardiovascular:     Rate and Rhythm: Normal rate and regular rhythm.     Heart sounds: Normal heart sounds. No murmur heard. Pulmonary:     Effort: Pulmonary effort is normal. No tachypnea, accessory muscle usage or respiratory distress.     Breath sounds: No stridor. No wheezing, rhonchi or rales.  Abdominal:     General: Bowel sounds are normal. There is no distension.     Palpations: Abdomen is soft.     Tenderness: There is no abdominal tenderness.  Musculoskeletal:        General: No tenderness.     Cervical back: Neck supple.  Lymphadenopathy:     Cervical: No cervical adenopathy.  Skin:    General: Skin is warm and dry.     Capillary Refill: Capillary refill takes less than 2 seconds.     Findings: No rash.  Neurological:     Mental Status: She is  alert and oriented to person, place, and time.  Psychiatric:        Behavior: Behavior normal.     Vitals:   03/16/21 1150  BP: 134/78  Pulse: 92  Temp: 98 F (36.7 C)  TempSrc: Oral  SpO2: 99%  Weight: 152 lb 3.2 oz (69 kg)  Height: 5\' 5"  (1.651 m)   99% on RA BMI Readings from Last 3 Encounters:  03/16/21 25.33 kg/m  03/02/21 23.40  kg/m  02/02/21 23.05 kg/m   Wt Readings from Last 3 Encounters:  03/16/21 152 lb 3.2 oz (69 kg)  03/02/21 149 lb 6.4 oz (67.8 kg)  02/02/21 147 lb 3.2 oz (66.8 kg)     CBC    Component Value Date/Time   WBC 4.7 02/27/2021 0758   WBC 5.1 01/28/2021 2004   RBC 4.68 02/27/2021 0758   HGB 14.0 02/27/2021 0758   HGB 13.1 04/09/2017 1721   HCT 41.4 02/27/2021 0758   HCT 39.8 04/09/2017 1721   PLT 186 02/27/2021 0758   PLT 355 04/09/2017 1721   MCV 88.5 02/27/2021 0758   MCV 91 04/09/2017 1721   MCH 29.9 02/27/2021 0758   MCHC 33.8 02/27/2021 0758   RDW 13.8 02/27/2021 0758   RDW 14.0 04/09/2017 1721   LYMPHSABS 1.2 02/27/2021 0758   LYMPHSABS 3.6 (H) 04/09/2017 1721   MONOABS 0.4 02/27/2021 0758   EOSABS 0.0 02/27/2021 0758   EOSABS 0.7 (H) 04/09/2017 1721   BASOSABS 0.0 02/27/2021 0758   BASOSABS 0.0 04/09/2017 1721    Chest Imaging: CT chest 2020-05-17: Patient found to have left lower lobe 3 cm lung mass with associated mediastinal adenopathy concerning for primary bronchogenic carcinoma and advanced age. Images reviewed today in the office.The patient's images have been independently reviewed by me.    02/28/2021 CT chest: Dramatic improvement of the left lower lobe lesion, radiation fibrosis. No significant hilar adenopathy. The patient's images have been independently reviewed by me.    Pulmonary Functions Testing Results: No flowsheet data found.  FeNO:   Pathology:   06/10/2020:  FINAL MICROSCOPIC DIAGNOSIS:  D. LYMPH NODE, 7 NODE, FINE NEEDLE ASPIRATION:  - Malignant cells consistent with non-small cell carcinoma. The patient's path have been reviewed by me.    Echocardiogram:   Heart Catheterization:    Assessment & Plan:     ICD-10-CM   1. Adenocarcinoma of left lung, stage 3 (HCC)  C34.92     2. History of radiation therapy  Z92.3     3. Asthma, severe persistent, well-controlled  J45.50     4. Adenopathy  R59.9     5. Personal history  of COVID-19  Z86.16       Discussion:  This is a 80 year old female, left upper lobe mass mediastinal adenopathy status post bronchoscopy tissue diagnosis of stage IIIb adenocarcinoma of the lung status post radiation and chemotherapy.  She has severe asthma at baseline on Fasenra plus Symbicort with as needed albuterol.  Plan: Continue her current asthma regimen She is doing well with this. Continue routine follow-up medical oncology. We will continue to follow her for her respiratory issues. She can see [place] in 6 months or as needed.     Current Outpatient Medications:    acetaminophen (TYLENOL) 325 MG tablet, Take 1 tablet (325 mg total) by mouth every 6 (six) hours as needed for headache or mild pain., Disp: , Rfl:    alum & mag hydroxide-simeth (MAALOX/MYLANTA) 200-200-20 MG/5ML suspension, Take 15 mLs by mouth  every 6 (six) hours as needed for indigestion or heartburn., Disp: , Rfl:    amLODipine (NORVASC) 5 MG tablet, Take 1 tablet (5 mg total) by mouth daily., Disp: 90 tablet, Rfl: 3   Benralizumab (FASENRA PEN) 30 MG/ML SOAJ, Inject into the skin every 8 (eight) weeks., Disp: , Rfl:    budesonide-formoterol (SYMBICORT) 80-4.5 MCG/ACT inhaler, Inhale 2 puffs into the lungs 2 (two) times daily. Use for asthma flares 2 puffs twice a day for 2 weeks or until cough and wheeze free, Disp: 10.2 g, Rfl: 5   Cholecalciferol (VITAMIN D3) 50 MCG (2000 UT) TABS, Take 2,000 Units by mouth daily., Disp: , Rfl:    diclofenac Sodium (VOLTAREN) 1 % GEL, Apply 4 g topically 4 (four) times daily., Disp: 100 g, Rfl: 0   EPINEPHrine (EPIPEN 2-PAK) 0.3 mg/0.3 mL IJ SOAJ injection, Use as directed for severe allergic reaction, Disp: 2 each, Rfl: 1   fluticasone (FLONASE) 50 MCG/ACT nasal spray, Place 2 sprays into both nostrils daily., Disp: 16 g, Rfl: 5   ipratropium (ATROVENT) 0.06 % nasal spray, USE 2 SPRAY(S) IN EACH NOSTRIL THREE TIMES DAILY, Disp: 15 mL, Rfl: 5   linaclotide (LINZESS) 72 MCG  capsule, TAKE 1 CAPSULE BY MOUTH ONCE DAILY AS NEEDED (Patient taking differently: Take 72 mcg by mouth daily as needed (constipation).), Disp: 30 capsule, Rfl: 0   loratadine (CLARITIN) 10 MG tablet, Take 10 mg by mouth daily., Disp: , Rfl:    Magnesium Hydroxide (PHILLIPS MILK OF MAGNESIA PO), Take 15-30 mLs by mouth daily as needed (for constipation)., Disp: , Rfl:    montelukast (SINGULAIR) 10 MG tablet, Take 1 tablet (10 mg total) by mouth at bedtime., Disp: 30 tablet, Rfl: 5   ondansetron (ZOFRAN) 8 MG tablet, Take 1 tablet (8 mg total) by mouth every 8 (eight) hours as needed for nausea or vomiting., Disp: 20 tablet, Rfl: 0   polyethylene glycol (MIRALAX / GLYCOLAX) 17 g packet, Take 17 g by mouth daily as needed for mild constipation., Disp: , Rfl:    pravastatin (PRAVACHOL) 80 MG tablet, Take 1 tablet (80 mg total) by mouth daily., Disp: 90 tablet, Rfl: 3   prochlorperazine (COMPAZINE) 10 MG tablet, Take 1 tablet (10 mg total) by mouth every 6 (six) hours as needed for nausea or vomiting., Disp: 30 tablet, Rfl: 0   traMADol (ULTRAM) 50 MG tablet, TAKE 1 TABLET BY MOUTH EVERY 6 HOURS AS NEEDED FOR MODERATE PAIN, Disp: 30 tablet, Rfl: 2   vitamin B-12 (CYANOCOBALAMIN) 1000 MCG tablet, Take 1,000 mcg by mouth daily., Disp: , Rfl:    Durvalumab (IMFINZI IV), Inject 1,500 mg into the vein every 28 (twenty-eight) days. Every 4 weeks (Patient not taking: Reported on 03/16/2021), Disp: , Rfl:    levofloxacin (LEVAQUIN) 500 MG tablet, Take 1 tablet (500 mg total) by mouth daily. (Patient not taking: Reported on 03/16/2021), Disp: 10 tablet, Rfl: 0  Current Facility-Administered Medications:    Benralizumab SOSY 30 mg, 30 mg, Subcutaneous, Q8 Weeks, Kennith Gain, MD, 30 mg at 02/14/21 [place], DO [facility] 03/16/2021 12:02 PM

## 2021-03-20 DIAGNOSIS — C3492 Malignant neoplasm of unspecified part of left bronchus or lung: Secondary | ICD-10-CM | POA: Diagnosis not present

## 2021-03-20 DIAGNOSIS — I10 Essential (primary) hypertension: Secondary | ICD-10-CM

## 2021-03-21 ENCOUNTER — Encounter: Payer: Self-pay | Admitting: Physical Therapy

## 2021-03-21 ENCOUNTER — Ambulatory Visit: Payer: Medicare HMO | Attending: Internal Medicine | Admitting: Physical Therapy

## 2021-03-21 ENCOUNTER — Other Ambulatory Visit: Payer: Self-pay

## 2021-03-21 VITALS — HR 87

## 2021-03-21 DIAGNOSIS — M6281 Muscle weakness (generalized): Secondary | ICD-10-CM | POA: Diagnosis not present

## 2021-03-21 DIAGNOSIS — R2689 Other abnormalities of gait and mobility: Secondary | ICD-10-CM | POA: Diagnosis not present

## 2021-03-21 DIAGNOSIS — R2681 Unsteadiness on feet: Secondary | ICD-10-CM | POA: Diagnosis not present

## 2021-03-21 NOTE — Therapy (Signed)
[facility] [address] [place], [place], [address] Phone: [phone]   Fax:  [phone]  Physical Therapy Evaluation  Patient Details  Name: Angelica [name] MRN: [id number] Date of Birth: Mar 31, 1941 Referring Provider (PT): [name], MD   Encounter Date: 03/21/2021   PT End of Session - 03/21/21 1223     Visit Number 1    Number of Visits 13    Date for PT Re-Evaluation 06/19/21    Authorization Type [employer]    PT Start Time 1147    PT Stop Time 1225    PT Time Calculation (min) 38 min    Equipment Utilized During Treatment Gait belt    Activity Tolerance Patient tolerated treatment well    Behavior During Therapy WFL for tasks assessed/performed             Past Medical History:  Diagnosis Date   Arthritis    Asthma    Bowel obstruction ([place])    Cancer ([place])    Environmental allergies    GERD (gastroesophageal reflux disease)    Glaucoma 05/09/2017   H. pylori infection    History of radiation therapy 07/05/20-08/05/20   Left lung IMRT Dr. Sondra Come    HLD (hyperlipidemia) 01/16/2019   Hypertension    Iron deficiency anemia    Osteopenia 05/26/2017   Thyroid disease    Urticaria 07/25/2018    Past Surgical History:  Procedure Laterality Date   ABDOMINAL HYSTERECTOMY     BOWEL RESECTION N/A 05/23/2020   Procedure: SMALL BOWEL REPAIR;  Surgeon: Kieth Brightly Arta Bruce, MD;  Location: [facility];  Service: General;  Laterality: N/A;   BREAST BIOPSY     BRONCHIAL BIOPSY  06/10/2020   Procedure: BRONCHIAL BIOPSIES;  Surgeon: Garner Nash, DO;  Location: [facility];  Service: Pulmonary;;   BRONCHIAL BRUSHINGS  06/10/2020   Procedure: BRONCHIAL BRUSHINGS;  Surgeon: Garner Nash, DO;  Location: [facility];  Service: Pulmonary;;   BRONCHIAL NEEDLE ASPIRATION BIOPSY  06/10/2020   Procedure: BRONCHIAL NEEDLE ASPIRATION BIOPSIES;  Surgeon: Garner Nash, DO;  Location: [facility];  Service:  Pulmonary;;   BRONCHIAL WASHINGS  06/10/2020   Procedure: BRONCHIAL WASHINGS;  Surgeon: Garner Nash, DO;  Location: [facility];  Service: Pulmonary;;   COLON SURGERY     DIAGNOSTIC LARYNGOSCOPY N/A 05/23/2020   Procedure: ATTEMPTED DIAGNOSTIC LAPAROSCOPY WITH ADHESIONS;  Surgeon: Kieth Brightly, Arta Bruce, MD;  Location: [facility];  Service: General;  Laterality: N/A;   IR IMAGING GUIDED PORT INSERTION  07/15/2020   KNEE SURGERY     LAPAROTOMY N/A 04/18/2015   Procedure: Exploratory laparotomy with lysis of adhesions, possible bowel resection;  Surgeon: Ralene Ok, MD;  Location: [facility];  Service: General;  Laterality: N/A;   LAPAROTOMY N/A 05/23/2020   Procedure: EXPLORATORY LAPAROTOMY;  Surgeon: Mickeal Skinner, MD;  Location: [facility];  Service: General;  Laterality: N/A;   LYSIS OF ADHESION N/A 05/23/2020   Procedure: LYSIS OF ADHESION;  Surgeon: Kieth Brightly Arta Bruce, MD;  Location: [facility];  Service: General;  Laterality: N/A;   NASAL SINUS SURGERY     THYROID SURGERY     VIDEO BRONCHOSCOPY WITH ENDOBRONCHIAL NAVIGATION Bilateral 06/10/2020   Procedure: VIDEO BRONCHOSCOPY WITH ENDOBRONCHIAL NAVIGATION;  Surgeon: Garner Nash, DO;  Location: [facility];  Service: Pulmonary;  Laterality: Bilateral;   VIDEO BRONCHOSCOPY WITH ENDOBRONCHIAL ULTRASOUND  06/10/2020   Procedure: VIDEO BRONCHOSCOPY WITH ENDOBRONCHIAL ULTRASOUND;  Surgeon: Garner Nash, DO;  Location: [facility];  Service: Pulmonary;;    Vitals:   03/21/21 1156  Pulse: 87  SpO2: 99%      Subjective Assessment - 03/21/21 1150     Subjective Pt with non-small cell lung cancer, diagnosed earlier this year. Has undergone raditation therapy and immunotherapy. Used to feel more weak and off balance when she walks. Is now eating more and feeling better with walking and balance. Not as wobbly anymore, but still feeling a bit weak. Has not used a device to help her walk. No falls.  Reports daughter helps her with taking her  meds and prepares her meals.    Pertinent History Adenocarcinoma of left lung, stage 3, hx of radiation therapy (from 07/05/20 - 08/05/20), asthma, osteopenia    Limitations Walking    Patient Stated Goals wants to get her strength back up.    Currently in Pain? No/denies                [facility] PT Assessment - 03/21/21 1156       Assessment   Medical Diagnosis Gait disorder   non-small cell lung cancer   Referring Provider (PT) [name], MD    Onset Date/Surgical Date 02/01/21    Hand Dominance Right    Prior Therapy HHPT      Precautions   Precautions Fall      Balance Screen   Has the patient fallen in the past 6 months No    Has the patient had a decrease in activity level because of a fear of falling?  No    Is the patient reluctant to leave their home because of a fear of falling?  No      Home Environment   Living Environment Private residence    Living Arrangements Children   Daughter   Type of [place] Access Level entry    [place] None    Additional Comments Daughter helps with cooking, pt able to hlep clean.      Prior Function   Level of Independence Independent    Leisure watching TV      Sensation   Light Touch Appears Intact      Coordination   Gross Motor Movements are Fluid and Coordinated Yes      ROM / Strength   AROM / PROM / Strength Strength      Strength   Strength Assessment Site Hip;Knee;Ankle    Right/Left Hip Right;Left    Right Hip Flexion 5/5    Left Hip Flexion 5/5    Right/Left Knee Right;Left    Right Knee Flexion 5/5    Right Knee Extension 5/5    Left Knee Flexion 5/5    Left Knee Extension 5/5    Right/Left Ankle Left;Right    Right Ankle Dorsiflexion 5/5    Left Ankle Dorsiflexion 5/5      Transfers   Transfers Stand to Sit;Sit to Stand    Sit to Stand 5: Supervision;Without upper extremity assist    Sit to Stand Details (indicate cue type and reason) Wider BOS when  performing    Five time sit to stand comments  17.43 seconds    Stand to Sit 5: Supervision;Without upper extremity assist    Comments 30 second chair stand: 8 sit <> stands, HR 104 bpm afterwards      Ambulation/Gait   Ambulation/Gait Yes    Ambulation/Gait Assistance 5: Supervision  Ambulation Distance (Feet) --   clinic distances   Assistive device None    Gait Pattern Step-through pattern;Decreased stance time - right;Decreased stance time - left;Decreased hip/knee flexion - right;Decreased hip/knee flexion - left;Left foot flat;Right foot flat    Ambulation Surface Level;Indoor    Gait velocity 19.69 seconds = 1.66 ft/sec      Standardized Balance Assessment   Standardized Balance Assessment Berg Balance Test;Timed Up and Go Test      Berg Balance Test   Sit to Stand Able to stand without using hands and stabilize independently    Standing Unsupported Able to stand safely 2 minutes    Sitting with Back Unsupported but Feet Supported on Floor or Stool Able to sit safely and securely 2 minutes    Stand to Sit Sits safely with minimal use of hands    Transfers Able to transfer safely, minor use of hands    Standing Unsupported with Eyes Closed Able to stand 10 seconds safely    Standing Unsupported with Feet Together Able to place feet together independently and stand 1 minute safely    From Standing, Reach Forward with Outstretched Arm Can reach forward >12 cm safely (5")   "   From Standing Position, Pick up Object from Floor Able to pick up shoe safely and easily    From Standing Position, Turn to Look Behind Over each Shoulder Looks behind from both sides and weight shifts well    Turn 360 Degrees Able to turn 360 degrees safely but slowly   8.59 to R, 7.47 to L   Standing Unsupported, Alternately Place Feet on Step/Stool Able to stand independently and safely and complete 8 steps in 20 seconds   18.72   Standing Unsupported, One Foot in Front Able to plae foot ahead of the  other independently and hold 30 seconds    Standing on One Leg Able to lift leg independently and hold equal to or more than 3 seconds   on RLE   Total Score 50    Berg comment: moderate fall risk      Timed Up and Go Test   Normal TUG (seconds) 14.06                        Objective measurements completed on examination: See above findings.                PT Education - 03/21/21 1222     Education Details Clinical findings, POC    Person(s) Educated Patient;Child(ren)    Methods Explanation    Comprehension Verbalized understanding              PT Short Term Goals - 03/21/21 1433       PT SHORT TERM GOAL #1   Title Pt will be independent with initial HEP with daughter's supervision in order to build upon functional gains made in therapy. ALL STGS DUE 04/11/21    Time 3    Period Weeks    Status New    Target Date 04/11/21      PT SHORT TERM GOAL #2   Title Pt and pt's daughter will verbalize understanding on fall prevention in the home.    Time 3    Period Weeks    Status New      PT SHORT TERM GOAL #3   Title Pt will improve gait speed to at least 1.9 ft/sec with no AD vs. LRAD in order to  demo decr fall risk.    Baseline 1.66 ft/sec    Time 3    Period Weeks    Status New      PT SHORT TERM GOAL #4   Title Pt will undergo further assessment of DGI with LTG written.    Time 3    Period Weeks    Status New               PT Long Term Goals - 03/21/21 1435       PT LONG TERM GOAL #1   Title Pt will be independent with final HEP with daughter's supervision in order to build upon functional gains made in therapy. ALL LTGS DUE 05/02/21    Time 6    Period Weeks    Status New    Target Date 05/02/21      PT LONG TERM GOAL #2   Title DGI goal to be written as appropriate to decr fall risk.    Time 6    Period Weeks    Status New      PT LONG TERM GOAL #3   Title Pt will improve 30 second chair stand to at least 10 sit  <>stands to be above age related norm for improved leg strength.    Baseline 8 sit <> stands no UE support    Time 6    Period Weeks    Status New      PT LONG TERM GOAL #4   Title Pt will perform TUG in 11.5 seconds or less in order to demo decr fall risk.    Baseline 14.06 seconds    Time 6    Period Weeks    Status New      PT LONG TERM GOAL #5   Title Pt will improve gait speed to at least 2.2 ft/sec with no AD vs. LRAD in order to demo decr fall risk.    Baseline 1.66 ft/sec    Time 6    Period Weeks    Status New                    Plan - 03/21/21 1234     Clinical Impression Statement Patient is a 80 year old female referred to Neuro OPPT for gait disorder/debility.   Pt's PMH is significant for: Adenocarcinoma of left lung, stage 3, hx of radiation therapy (from 07/05/20 - 08/05/20), asthma, osteopenia. Pt reports that her balance/weakness have been improved since eating more. The following deficits were present during the exam: decr functional strength, gait abnormalities, impaired balance, decr activity tolerance. Based on BERG, pt is at a moderate fall risk. Pt's gait speed, 5x sit <> Stand, and TUG indicates incr fall risk. Pt's 30 second chair stand without UE support is below age related norms. Pt would benefit from skilled PT to address these impairments and functional limitations to maximize functional mobility independence and to decr fall risk.    Personal Factors and Comorbidities Comorbidity 3+;Past/Current Experience;Time since onset of injury/illness/exacerbation    Comorbidities Adenocarcinoma of left lung, stage 3, hx of radiation therapy (from 07/05/20 - 08/05/20), asthma, osteopenia    Examination-Activity Limitations Locomotion Level;Transfers;Stairs    Examination-Participation Restrictions Driving;Community Activity;Meal Prep;Laundry    Stability/Clinical Decision Making Evolving/Moderate complexity    Clinical Decision Making Moderate    Rehab  Potential Good    PT Frequency 2x / week    PT Duration 12 weeks    PT Treatment/Interventions ADLs/Self Care Home  Management;DME Instruction;Gait training;Stair training;Functional mobility training;Therapeutic activities;Neuromuscular re-education;Balance training;Therapeutic exercise;Patient/family education;Passive range of motion;Vestibular    PT Next Visit Plan Perform DGI. further assess hip strength. initial HEP for functional strength/standing balance.    Consulted and Agree with Plan of Care Patient;Family member/caregiver    Family Member Consulted pt's daughter.             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   Malignant neoplasm of bronchus and lung ([facility])    Port-A-Cath in place 11/02/2020   Adenocarcinoma of left lung, stage 3 ([place]) 06/22/2020   Lung mass    Pulmonary nodule 05/17/2020   Iron deficiency 05/03/2020   Swelling of lower extremity 07/30/2019   Ganglion cyst of right foot 01/16/2019   HLD (hyperlipidemia) 01/16/2019   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 pylori infection    Other allergic rhinitis 03/20/2016   Malnutrition of moderate degree 04/11/2015   HTN (hypertension) 04/10/2015    Arliss Journey, PT, DPT  03/21/2021, 2:42 PM  [facility] [address] [place], [place], [address] Phone: [phone]   Fax:  [phone]  Name: Angelica [name] MRN: [id number] Date of Birth: 04-11-1941

## 2021-03-28 ENCOUNTER — Other Ambulatory Visit: Payer: Self-pay

## 2021-03-28 ENCOUNTER — Ambulatory Visit: Payer: Medicare HMO

## 2021-03-28 DIAGNOSIS — R2689 Other abnormalities of gait and mobility: Secondary | ICD-10-CM

## 2021-03-28 DIAGNOSIS — R2681 Unsteadiness on feet: Secondary | ICD-10-CM

## 2021-03-28 DIAGNOSIS — M6281 Muscle weakness (generalized): Secondary | ICD-10-CM

## 2021-03-28 NOTE — Patient Instructions (Signed)
Access Code: [email or web address] URL: [email or web address] Date: 03/28/2021 Prepared by: Baldomero Lamy  Exercises Sit to Stand with Arms Crossed - 2 x daily - 7 x weekly - 1 sets - 10 reps Standing Near Stance in Corner with Eyes Closed - 1 x daily - 7 x weekly - 1 sets - 3 reps - 30 seconds hold Standing with Head Rotation on Pillow - 1 x daily - 7 x weekly - 3 sets - 5 reps Standing with Head Nod on Pillow - 1 x daily - 7 x weekly - 3 sets - 5 reps

## 2021-03-28 NOTE — Therapy (Signed)
[facility] [address] [place], [place], [address] Phone: [phone]   Fax:  [phone]  Physical Therapy Treatment  Patient Details  Name: Angelica [name] MRN: [id number] Date of Birth: June 30, 1940 Referring Provider (PT): [name], MD   Encounter Date: 03/28/2021   PT End of Session - 03/28/21 1150     Visit Number 2    Number of Visits 13    Date for PT Re-Evaluation 06/19/21    Authorization Type [employer]    PT Start Time 1147    PT Stop Time 1230    PT Time Calculation (min) 43 min    Equipment Utilized During Treatment Gait belt    Activity Tolerance Patient tolerated treatment well    Behavior During Therapy [facility] for tasks assessed/performed             Past Medical History:  Diagnosis Date   Arthritis    Asthma    Bowel obstruction ([place])    Cancer ([place])    Environmental allergies    GERD (gastroesophageal reflux disease)    Glaucoma 05/09/2017   H. pylori infection    History of radiation therapy 07/05/20-08/05/20   Left lung IMRT Dr. Sondra Come    HLD (hyperlipidemia) 01/16/2019   Hypertension    Iron deficiency anemia    Osteopenia 05/26/2017   Thyroid disease    Urticaria 07/25/2018    Past Surgical History:  Procedure Laterality Date   ABDOMINAL HYSTERECTOMY     BOWEL RESECTION N/A 05/23/2020   Procedure: SMALL BOWEL REPAIR;  Surgeon: Kieth Brightly Arta Bruce, MD;  Location: [facility];  Service: General;  Laterality: N/A;   BREAST BIOPSY     BRONCHIAL BIOPSY  06/10/2020   Procedure: BRONCHIAL BIOPSIES;  Surgeon: Garner Nash, DO;  Location: [facility];  Service: Pulmonary;;   BRONCHIAL BRUSHINGS  06/10/2020   Procedure: BRONCHIAL BRUSHINGS;  Surgeon: Garner Nash, DO;  Location: [facility];  Service: Pulmonary;;   BRONCHIAL NEEDLE ASPIRATION BIOPSY  06/10/2020   Procedure: BRONCHIAL NEEDLE ASPIRATION BIOPSIES;  Surgeon: Garner Nash, DO;  Location: [facility];  Service:  Pulmonary;;   BRONCHIAL WASHINGS  06/10/2020   Procedure: BRONCHIAL WASHINGS;  Surgeon: Garner Nash, DO;  Location: [facility];  Service: Pulmonary;;   COLON SURGERY     DIAGNOSTIC LARYNGOSCOPY N/A 05/23/2020   Procedure: ATTEMPTED DIAGNOSTIC LAPAROSCOPY WITH ADHESIONS;  Surgeon: Kieth Brightly, Arta Bruce, MD;  Location: [facility];  Service: General;  Laterality: N/A;   IR IMAGING GUIDED PORT INSERTION  07/15/2020   KNEE SURGERY     LAPAROTOMY N/A 04/18/2015   Procedure: Exploratory laparotomy with lysis of adhesions, possible bowel resection;  Surgeon: Ralene Ok, MD;  Location: [facility];  Service: General;  Laterality: N/A;   LAPAROTOMY N/A 05/23/2020   Procedure: EXPLORATORY LAPAROTOMY;  Surgeon: Mickeal Skinner, MD;  Location: [facility];  Service: General;  Laterality: N/A;   LYSIS OF ADHESION N/A 05/23/2020   Procedure: LYSIS OF ADHESION;  Surgeon: Kieth Brightly Arta Bruce, MD;  Location: [facility];  Service: General;  Laterality: N/A;   NASAL SINUS SURGERY     THYROID SURGERY     VIDEO BRONCHOSCOPY WITH ENDOBRONCHIAL NAVIGATION Bilateral 06/10/2020   Procedure: VIDEO BRONCHOSCOPY WITH ENDOBRONCHIAL NAVIGATION;  Surgeon: Garner Nash, DO;  Location: [facility];  Service: Pulmonary;  Laterality: Bilateral;   VIDEO BRONCHOSCOPY WITH ENDOBRONCHIAL ULTRASOUND  06/10/2020   Procedure: VIDEO BRONCHOSCOPY WITH ENDOBRONCHIAL ULTRASOUND;  Surgeon: Garner Nash, DO;  Location: [facility];  Service: Pulmonary;;    There were no vitals filed for this visit.   Subjective Assessment - 03/28/21 1149     Subjective Patient reports she has been doing well. No new changes/complaints. No falls, report sshe is being careful.    Pertinent History Adenocarcinoma of left lung, stage 3, hx of radiation therapy (from 07/05/20 - 08/05/20), asthma, osteopenia    Limitations Walking    Patient Stated Goals wants to get her strength back up.    Currently in Pain? Yes    Pain Score 5     Pain Location Back     Pain Orientation Lower    Pain Descriptors / Indicators Aching    Aggravating Factors  walking                [facility] PT Assessment - 03/28/21 0001       Transfers   Comments completed sit <> stand without BUE support.      6 Minute Walk- Baseline   6 Minute Walk- Baseline yes    HR (bpm) 72    02 Sat (%RA) 99 %    Modified Borg Scale for Dyspnea 0- Nothing at all      6 Minute walk- Post Test   6 Minute Walk Post Test yes    HR (bpm) 82    02 Sat (%RA) 99 %    Modified Borg Scale for Dyspnea 2- Mild shortness of breath    Perceived Rate of Exertion (Borg) 10-      6 minute walk test results    Aerobic Endurance Distance Walked 447    Endurance additional comments with 6MWT, no AD. very slow speed noted. reports mild discomfort in low back.      Standardized Balance Assessment   Standardized Balance Assessment Dynamic Gait Index      Dynamic Gait Index   Level Surface Mild Impairment    Change in Gait Speed Moderate Impairment    Gait with Horizontal Head Turns Mild Impairment    Gait with Vertical Head Turns Moderate Impairment    Gait and Pivot Turn Mild Impairment    Step Over Obstacle Moderate Impairment    Step Around Obstacles Normal    Steps Mild Impairment    Total Score 14    DGI comment: Sp02: 99% after completion              [facility] Adult PT Treatment/Exercise - 03/28/21 0001       Transfers   Transfers Stand to Sit;Sit to Stand    Sit to Stand 5: Supervision;Without upper extremity assist    Stand to Sit 5: Supervision;Without upper extremity assist    Number of Reps 10 reps;1 set      Ambulation/Gait   Ambulation/Gait Yes    Ambulation/Gait Assistance 5: Supervision    Ambulation/Gait Assistance Details with completion of 6MWT; see assesment tab    Assistive device None    Gait Pattern Step-through pattern;Decreased stance time - right;Decreased stance time - left;Decreased hip/knee flexion - right;Decreased hip/knee flexion - left;Left  foot flat;Right foot flat    Ambulation Surface Level;Indoor              Balance Exercises - 03/28/21 0001       Balance Exercises: Standing   Standing Eyes Opened Wide (BOA);Foam/compliant surface;Limitations    Standing Eyes Opened Limitations standing bil stance on pillow and completed horizontal/vertical head turns with EO 2 x  5 reps each. more  challenge with vertical > horizontal.    Standing Eyes Closed Narrow base of support (BOS);Solid surface;3 reps;30 secs;Limitations    Standing Eyes Closed Limitations mild postural sway noted             Access Code: [email or web address] URL: [email or web address] Date: 03/28/2021 Prepared by: Baldomero Lamy   Exercises Sit to Stand with Arms Crossed - 2 x daily - 7 x weekly - 1 sets - 10 reps Standing Near Stance in Corner with Eyes Closed - 1 x daily - 7 x weekly - 1 sets - 3 reps - 30 seconds hold Standing with Head Rotation on Pillow - 1 x daily - 7 x weekly - 3 sets - 5 reps Standing with Head Nod on Pillow - 1 x daily - 7 x weekly - 3 sets - 5 reps   PT Education - 03/28/21 1232     Education Details DGI/6MWT results; HEP    Person(s) Educated Patient    Methods Explanation    Comprehension Verbalized understanding              PT Short Term Goals - 03/21/21 1433       PT SHORT TERM GOAL #1   Title Pt will be independent with initial HEP with daughter's supervision in order to build upon functional gains made in therapy. ALL STGS DUE 04/11/21    Time 3    Period Weeks    Status New    Target Date 04/11/21      PT SHORT TERM GOAL #2   Title Pt and pt's daughter will verbalize understanding on fall prevention in the home.    Time 3    Period Weeks    Status New      PT SHORT TERM GOAL #3   Title Pt will improve gait speed to at least 1.9 ft/sec with no AD vs. LRAD in order to demo decr fall risk.    Baseline 1.66 ft/sec    Time 3    Period Weeks    Status New      PT SHORT TERM GOAL #4    Title Pt will undergo further assessment of DGI with LTG written.    Time 3    Period Weeks    Status New               PT Long Term Goals - 03/21/21 1435       PT LONG TERM GOAL #1   Title Pt will be independent with final HEP with daughter's supervision in order to build upon functional gains made in therapy. ALL LTGS DUE 05/02/21    Time 6    Period Weeks    Status New    Target Date 05/02/21      PT LONG TERM GOAL #2   Title DGI goal to be written as appropriate to decr fall risk.    Time 6    Period Weeks    Status New      PT LONG TERM GOAL #3   Title Pt will improve 30 second chair stand to at least 10 sit <>stands to be above age related norm for improved leg strength.    Baseline 8 sit <> stands no UE support    Time 6    Period Weeks    Status New      PT LONG TERM GOAL #4   Title Pt will perform TUG in 11.5 seconds or less in order to demo decr fall risk.  Baseline 14.06 seconds    Time 6    Period Weeks    Status New      PT LONG TERM GOAL #5   Title Pt will improve gait speed to at least 2.2 ft/sec with no AD vs. LRAD in order to demo decr fall risk.    Baseline 1.66 ft/sec    Time 6    Period Weeks    Status New                   Plan - 03/28/21 1239     Clinical Impression Statement Today's skilled PT session focused on further assesment, including DGI and 6MWT. Patient scored 14/24 on DGI demonstrating high fall risk, and then with 6MWT patient ambulating 447 ft with mild SOB demo decreased endurance. Rest of session focused on establishing initial HEP. Will continue to progress per STG/LTGs.    Personal Factors and Comorbidities Comorbidity 3+;Past/Current Experience;Time since onset of injury/illness/exacerbation    Comorbidities Adenocarcinoma of left lung, stage 3, hx of radiation therapy (from 07/05/20 - 08/05/20), asthma, osteopenia    Examination-Activity Limitations Locomotion Level;Transfers;Stairs    Examination-Participation  Restrictions Driving;Community Activity;Meal Prep;Laundry    Stability/Clinical Decision Making Evolving/Moderate complexity    Rehab Potential Good    PT Frequency 2x / week    PT Duration 12 weeks    PT Treatment/Interventions ADLs/Self Care Home Management;DME Instruction;Gait training;Stair training;Functional mobility training;Therapeutic activities;Neuromuscular re-education;Balance training;Therapeutic exercise;Patient/family education;Passive range of motion;Vestibular    PT Next Visit Plan how was HEP? Add further exercises if needed. Continue gait working on dynamic balance, standing balance.    Consulted and Agree with Plan of Care Patient;Family member/caregiver    Family Member Consulted pt's daughter.             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   Malignant neoplasm of bronchus and lung ([place])    Port-A-Cath in place 11/02/2020   Adenocarcinoma of left lung, stage 3 ([place]) 06/22/2020   Lung mass    Pulmonary nodule 05/17/2020   Iron deficiency 05/03/2020   Swelling of lower extremity 07/30/2019   Ganglion cyst of right foot 01/16/2019   HLD (hyperlipidemia) 01/16/2019   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 pylori infection    Other allergic rhinitis  03/20/2016   Malnutrition of moderate degree 04/11/2015   HTN (hypertension) 04/10/2015    Jones Bales, PT, DPT 03/28/2021, 12:41 PM  [facility] [address] [place], [place], [address] Phone: [phone]   Fax:  [phone]  Name: EVERLI ROTHER MRN: [id number] Date of Birth: 29-Mar-1941

## 2021-03-29 ENCOUNTER — Inpatient Hospital Stay: Payer: Medicare HMO | Attending: Internal Medicine

## 2021-03-29 DIAGNOSIS — Z452 Encounter for adjustment and management of vascular access device: Secondary | ICD-10-CM | POA: Diagnosis not present

## 2021-03-29 DIAGNOSIS — C3432 Malignant neoplasm of lower lobe, left bronchus or lung: Secondary | ICD-10-CM | POA: Insufficient documentation

## 2021-03-29 DIAGNOSIS — Z95828 Presence of other vascular implants and grafts: Secondary | ICD-10-CM

## 2021-03-29 MED ORDER — HEPARIN SOD (PORK) LOCK FLUSH 100 UNIT/ML IV SOLN
500.0000 [IU] | Freq: Once | INTRAVENOUS | Status: AC
  Administered 2021-03-29: 500 [IU]

## 2021-03-29 MED ORDER — SODIUM CHLORIDE 0.9% FLUSH
10.0000 mL | Freq: Once | INTRAVENOUS | Status: AC
  Administered 2021-03-29: 10 mL

## 2021-04-04 ENCOUNTER — Ambulatory Visit: Payer: Medicare HMO

## 2021-04-04 ENCOUNTER — Other Ambulatory Visit: Payer: Self-pay

## 2021-04-04 DIAGNOSIS — R2681 Unsteadiness on feet: Secondary | ICD-10-CM | POA: Diagnosis not present

## 2021-04-04 DIAGNOSIS — R2689 Other abnormalities of gait and mobility: Secondary | ICD-10-CM

## 2021-04-04 DIAGNOSIS — M6281 Muscle weakness (generalized): Secondary | ICD-10-CM | POA: Diagnosis not present

## 2021-04-04 NOTE — Therapy (Signed)
[facility] [address] [place], [place], [address] Phone: [phone]   Fax:  [phone]  Physical Therapy Treatment  Patient Details  Name: Angelica [name] MRN: [id number] Date of Birth: [birth year] Referring Provider (PT): [name], MD   Encounter Date: 04/04/2021   PT End of Session - 04/04/21 1014     Visit Number 3    Number of Visits 13    Date for PT Re-Evaluation 06/19/21    Authorization Type [employer]    PT Start Time 8127   patient needing to use bathroom prior to start of session   PT Stop Time 1058    PT Time Calculation (min) 39 min    Equipment Utilized During Treatment Gait belt    Activity Tolerance Patient tolerated treatment well    Behavior During Therapy [facility] for tasks assessed/performed             Past Medical History:  Diagnosis Date   Arthritis    Asthma    Bowel obstruction ([place])    Cancer ([place])    Environmental allergies    GERD (gastroesophageal reflux disease)    Glaucoma 05/09/2017   H. pylori infection    History of radiation therapy 07/05/20-08/05/20   Left lung IMRT Dr. Sondra Come    HLD (hyperlipidemia) 01/16/2019   Hypertension    Iron deficiency anemia    Osteopenia 05/26/2017   Thyroid disease    Urticaria 07/25/2018    Past Surgical History:  Procedure Laterality Date   ABDOMINAL HYSTERECTOMY     BOWEL RESECTION N/A 05/23/2020   Procedure: SMALL BOWEL REPAIR;  Surgeon: Kieth Brightly Arta Bruce, MD;  Location: [facility];  Service: General;  Laterality: N/A;   BREAST BIOPSY     BRONCHIAL BIOPSY  06/10/2020   Procedure: BRONCHIAL BIOPSIES;  Surgeon: Garner Nash, DO;  Location: [facility];  Service: Pulmonary;;   BRONCHIAL BRUSHINGS  06/10/2020   Procedure: BRONCHIAL BRUSHINGS;  Surgeon: Garner Nash, DO;  Location: [facility];  Service: Pulmonary;;   BRONCHIAL NEEDLE ASPIRATION BIOPSY  06/10/2020   Procedure: BRONCHIAL NEEDLE ASPIRATION BIOPSIES;   Surgeon: Garner Nash, DO;  Location: [facility];  Service: Pulmonary;;   BRONCHIAL WASHINGS  06/10/2020   Procedure: BRONCHIAL WASHINGS;  Surgeon: Garner Nash, DO;  Location: [facility];  Service: Pulmonary;;   COLON SURGERY     DIAGNOSTIC LARYNGOSCOPY N/A 05/23/2020   Procedure: ATTEMPTED DIAGNOSTIC LAPAROSCOPY WITH ADHESIONS;  Surgeon: Kieth Brightly, Arta Bruce, MD;  Location: [facility];  Service: General;  Laterality: N/A;   IR IMAGING GUIDED PORT INSERTION  07/15/2020   KNEE SURGERY     LAPAROTOMY N/A 04/18/2015   Procedure: Exploratory laparotomy with lysis of adhesions, possible bowel resection;  Surgeon: Ralene Ok, MD;  Location: [facility];  Service: General;  Laterality: N/A;   LAPAROTOMY N/A 05/23/2020   Procedure: EXPLORATORY LAPAROTOMY;  Surgeon: Mickeal Skinner, MD;  Location: [facility];  Service: General;  Laterality: N/A;   LYSIS OF ADHESION N/A 05/23/2020   Procedure: LYSIS OF ADHESION;  Surgeon: Kieth Brightly Arta Bruce, MD;  Location: [facility];  Service: General;  Laterality: N/A;   NASAL SINUS SURGERY     THYROID SURGERY     VIDEO BRONCHOSCOPY WITH ENDOBRONCHIAL NAVIGATION Bilateral 06/10/2020   Procedure: VIDEO BRONCHOSCOPY WITH ENDOBRONCHIAL NAVIGATION;  Surgeon: Garner Nash, DO;  Location: [facility];  Service: Pulmonary;  Laterality: Bilateral;   VIDEO BRONCHOSCOPY WITH ENDOBRONCHIAL ULTRASOUND  06/10/2020   Procedure:  VIDEO BRONCHOSCOPY WITH ENDOBRONCHIAL ULTRASOUND;  Surgeon: Garner Nash, DO;  Location: [facility];  Service: Pulmonary;;    There were no vitals filed for this visit.   Subjective Assessment - 04/04/21 1015     Subjective Patient reports she has been doing well, no complaints. No med changes. No falls.    Pertinent History Adenocarcinoma of left lung, stage 3, hx of radiation therapy (from 07/05/20 - 08/05/20), asthma, osteopenia    Limitations Walking    Patient Stated Goals wants to get her strength back up.    Currently in Pain? No/denies                [facility] Adult PT Treatment/Exercise - 04/04/21 0001       Transfers   Transfers Stand to Sit;Sit to Stand    Sit to Stand 5: Supervision;Without upper extremity assist    Stand to Sit 5: Supervision;Without upper extremity assist    Number of Reps 10 reps;1 set    Comments completed x 10 reps sit <> stand working on reduced UE support, intermittent pushing from legs with BLE on airex.      Ambulation/Gait   Ambulation/Gait Yes    Ambulation/Gait Assistance 5: Supervision    Ambulation/Gait Assistance Details with high level balance    Assistive device None    Gait Pattern Step-through pattern;Decreased stance time - right;Decreased stance time - left;Decreased hip/knee flexion - right;Decreased hip/knee flexion - left;Left foot flat;Right foot flat    Ambulation Surface Level;Indoor      High Level Balance   High Level Balance Activities Backward walking;Sudden stops;Head turns;Turns;Direction changes;Negotiating over obstacles    High Level Balance Comments completed ambulation with high level balance incorporated x 345 with head turns/nods, backwards walking, sudden stops, directoin changes, and increase/slow gait speed on command. CGA intermittently. Completed obstacle course with working on stepping over hurdles and alternating toe taps to cone prior to step over, completed x 3 laps down and back with CGA. more notable challenge with SLS on LLE.      Exercises   Exercises Knee/Hip      Knee/Hip Exercises: Aerobic   Nustep Completed activity/endurance training on NuStep on level 3.0 with BUE/BLE x 7 mins. Vitals Stable, 02 99% with completion. Patient tolerating well.              Balance Exercises - 04/04/21 0001       Balance Exercises: Standing   Standing Eyes Opened Wide (BOA);Head turns;Foam/compliant surface    Standing Eyes Opened Limitations standing bil stance on airex and completed horizontal/vertical head turns with EO x 10 reps each.     Standing Eyes Closed Wide (BOA);Foam/compliant surface;3 reps;30 secs;Limitations    Standing Eyes Closed Limitations 3 x 30 seconds on airex, initially with bil stance progressing to more narrow BOS.    SLS with Vectors Foam/compliant surface;Intermittent upper extremity assist;Limitations    SLS with Vectors Limitations standing on airex with bil stance completed alternating toe taps forward with single UE support x 10 reps, then last 5 reps completed iwthout UE support and CGA. more difficulty noted with SLS on LLE.                  PT Short Term Goals - 04/04/21 1029       PT SHORT TERM GOAL #1   Title Pt will be independent with initial HEP with daughter's supervision in order to build upon functional gains made in therapy. ALL STGS DUE 04/11/21  Time 3    Period Weeks    Status New    Target Date 04/11/21      PT SHORT TERM GOAL #2   Title Pt and pt's daughter will verbalize understanding on fall prevention in the home.    Time 3    Period Weeks    Status New      PT SHORT TERM GOAL #3   Title Pt will improve gait speed to at least 1.9 ft/sec with no AD vs. LRAD in order to demo decr fall risk.    Baseline 1.66 ft/sec    Time 3    Period Weeks    Status New      PT SHORT TERM GOAL #4   Title Pt will undergo further assessment of DGI with LTG written.    Baseline 14/24    Time 3    Period Weeks    Status Achieved               PT Long Term Goals - 04/04/21 1029       PT LONG TERM GOAL #1   Title Pt will be independent with final HEP with daughter's supervision in order to build upon functional gains made in therapy. ALL LTGS DUE 05/02/21    Time 6    Period Weeks    Status New      PT LONG TERM GOAL #2   Title Patient will improve DGI to >/= 18/24 to demo improved balance and reduced fall risk    Baseline 14/24    Time 6    Period Weeks    Status Revised      PT LONG TERM GOAL #3   Title Pt will improve 30 second chair stand to at least 10  sit <>stands to be above age related norm for improved leg strength.    Baseline 8 sit <> stands no UE support    Time 6    Period Weeks    Status New      PT LONG TERM GOAL #4   Title Pt will perform TUG in 11.5 seconds or less in order to demo decr fall risk.    Baseline 14.06 seconds    Time 6    Period Weeks    Status New      PT LONG TERM GOAL #5   Title Pt will improve gait speed to at least 2.2 ft/sec with no AD vs. LRAD in order to demo decr fall risk.    Baseline 1.66 ft/sec    Time 6    Period Weeks    Status New                   Plan - 04/04/21 1027     Clinical Impression Statement Continued session focused on endurance/activity training on NuStep with patient tolerating well and vitals stable. Initiated high level balance with ambulation to further challenge balance, most challenge noted with head turn and SLS activities LLE > RLE. Will continue per POC.    Personal Factors and Comorbidities Comorbidity 3+;Past/Current Experience;Time since onset of injury/illness/exacerbation    Comorbidities Adenocarcinoma of left lung, stage 3, hx of radiation therapy (from 07/05/20 - 08/05/20), asthma, osteopenia    Examination-Activity Limitations Locomotion Level;Transfers;Stairs    Examination-Participation Restrictions Driving;Community Activity;Meal Prep;Laundry    Stability/Clinical Decision Making Evolving/Moderate complexity    Rehab Potential Good    PT Frequency 2x / week    PT Duration 12 weeks  PT Treatment/Interventions ADLs/Self Care Home Management;DME Instruction;Gait training;Stair training;Functional mobility training;Therapeutic activities;Neuromuscular re-education;Balance training;Therapeutic exercise;Patient/family education;Passive range of motion;Vestibular    PT Next Visit Plan continue to progress HEP. Check STGs. continue SLS activities, continue gait working on dynamic balance, standing balance. working on increasing gait speed.    Consulted  and Agree with Plan of Care Patient;Family member/caregiver    Family Member Consulted pt's daughter.             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   Malignant neoplasm of bronchus and lung ([place])    Port-A-Cath in place 11/02/2020   Adenocarcinoma of left lung, stage 3 ([place]) 06/22/2020   Lung mass    Pulmonary nodule 05/17/2020   Iron deficiency 05/03/2020   Swelling of lower extremity 07/30/2019   Ganglion cyst of right foot 01/16/2019   HLD (hyperlipidemia) 01/16/2019   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 pylori infection    Other allergic rhinitis 03/20/2016   Malnutrition of moderate degree 04/11/2015   HTN (hypertension) 04/10/2015    Jones Bales, PT, DPT 04/04/2021, 11:00 AM  [facility] [address] [place], [place], [address] Phone: [phone]   Fax:  [phone]  Name: Angelica [name] MRN: [id number] Date of Birth: 12-Sep-1940

## 2021-04-11 ENCOUNTER — Encounter: Payer: Self-pay | Admitting: Physical Therapy

## 2021-04-11 ENCOUNTER — Other Ambulatory Visit: Payer: Self-pay

## 2021-04-11 ENCOUNTER — Ambulatory Visit (INDEPENDENT_AMBULATORY_CARE_PROVIDER_SITE_OTHER): Payer: Medicare HMO

## 2021-04-11 ENCOUNTER — Ambulatory Visit: Payer: Medicare HMO | Admitting: Physical Therapy

## 2021-04-11 DIAGNOSIS — R2681 Unsteadiness on feet: Secondary | ICD-10-CM | POA: Diagnosis not present

## 2021-04-11 DIAGNOSIS — M6281 Muscle weakness (generalized): Secondary | ICD-10-CM

## 2021-04-11 DIAGNOSIS — J4550 Severe persistent asthma, uncomplicated: Secondary | ICD-10-CM

## 2021-04-11 DIAGNOSIS — R2689 Other abnormalities of gait and mobility: Secondary | ICD-10-CM

## 2021-04-11 NOTE — Patient Instructions (Signed)

## 2021-04-11 NOTE — Therapy (Signed)
[facility] [address] [place], [place], [address] Phone: [phone]   Fax:  [phone]  Physical Therapy Treatment  Patient Details  Name: Angelica [name] MRN: [id number] Date of Birth: [birth year] Referring Provider (PT): [name], MD   Encounter Date: 04/11/2021   PT End of Session - 04/11/21 1235     Visit Number 4    Number of Visits 13    Date for PT Re-Evaluation 06/19/21    Authorization Type [employer] - 12 visits approved 03/21/21 - 06/19/21    PT Start Time 1233    PT Stop Time 1313    PT Time Calculation (min) 40 min    Equipment Utilized During Treatment Gait belt    Activity Tolerance Patient tolerated treatment well    Behavior During Therapy [facility] for tasks assessed/performed             Past Medical History:  Diagnosis Date   Arthritis    Asthma    Bowel obstruction ([place])    Cancer ([place])    Environmental allergies    GERD (gastroesophageal reflux disease)    Glaucoma 05/09/2017   H. pylori infection    History of radiation therapy 07/05/20-08/05/20   Left lung IMRT Dr. Sondra Come    HLD (hyperlipidemia) 01/16/2019   Hypertension    Iron deficiency anemia    Osteopenia 05/26/2017   Thyroid disease    Urticaria 07/25/2018    Past Surgical History:  Procedure Laterality Date   ABDOMINAL HYSTERECTOMY     BOWEL RESECTION N/A 05/23/2020   Procedure: SMALL BOWEL REPAIR;  Surgeon: Kieth Brightly Arta Bruce, MD;  Location: [place];  Service: General;  Laterality: N/A;   BREAST BIOPSY     BRONCHIAL BIOPSY  06/10/2020   Procedure: BRONCHIAL BIOPSIES;  Surgeon: Garner Nash, DO;  Location: [place] [facility];  Service: Pulmonary;;   BRONCHIAL BRUSHINGS  06/10/2020   Procedure: BRONCHIAL BRUSHINGS;  Surgeon: Garner Nash, DO;  Location: [place] [facility];  Service: Pulmonary;;   BRONCHIAL NEEDLE ASPIRATION BIOPSY  06/10/2020   Procedure: BRONCHIAL NEEDLE ASPIRATION BIOPSIES;  Surgeon: Garner Nash,  DO;  Location: [facility];  Service: Pulmonary;;   BRONCHIAL WASHINGS  06/10/2020   Procedure: BRONCHIAL WASHINGS;  Surgeon: Garner Nash, DO;  Location: [place] [facility] [facility];  Service: Pulmonary;;   COLON SURGERY     DIAGNOSTIC LARYNGOSCOPY N/A 05/23/2020   Procedure: ATTEMPTED DIAGNOSTIC LAPAROSCOPY WITH ADHESIONS;  Surgeon: Kieth Brightly, Arta Bruce, MD;  Location: [facility];  Service: General;  Laterality: N/A;   IR IMAGING GUIDED PORT INSERTION  07/15/2020   KNEE SURGERY     LAPAROTOMY N/A 04/18/2015   Procedure: Exploratory laparotomy with lysis of adhesions, possible bowel resection;  Surgeon: Ralene Ok, MD;  Location: [place];  Service: General;  Laterality: N/A;   LAPAROTOMY N/A 05/23/2020   Procedure: EXPLORATORY LAPAROTOMY;  Surgeon: Mickeal Skinner, MD;  Location: [facility];  Service: General;  Laterality: N/A;   LYSIS OF ADHESION N/A 05/23/2020   Procedure: LYSIS OF ADHESION;  Surgeon: Kieth Brightly Arta Bruce, MD;  Location: [place];  Service: General;  Laterality: N/A;   NASAL SINUS SURGERY     THYROID SURGERY     VIDEO BRONCHOSCOPY WITH ENDOBRONCHIAL NAVIGATION Bilateral 06/10/2020   Procedure: VIDEO BRONCHOSCOPY WITH ENDOBRONCHIAL NAVIGATION;  Surgeon: Garner Nash, DO;  Location: [facility];  Service: Pulmonary;  Laterality: Bilateral;   VIDEO BRONCHOSCOPY WITH ENDOBRONCHIAL ULTRASOUND  06/10/2020   Procedure: VIDEO BRONCHOSCOPY WITH ENDOBRONCHIAL  ULTRASOUND;  Surgeon: Garner Nash, DO;  Location: [place] [facility];  Service: Pulmonary;;    There were no vitals filed for this visit.   Subjective Assessment - 04/11/21 1235     Subjective No falls, doing her exercises. Feels like she is moving better.    Pertinent History Adenocarcinoma of left lung, stage 3, hx of radiation therapy (from 07/05/20 - 08/05/20), asthma, osteopenia    Limitations Walking    Patient Stated Goals wants to get her strength back up.    Currently in Pain? No/denies                                [facility] Adult PT Treatment/Exercise - 04/11/21 1236       Transfers   Transfers Stand to Sit;Sit to Stand    Sit to Stand 5: Supervision;Without upper extremity assist    Stand to Sit 5: Supervision;Without upper extremity assist    Comments x10 reps sit <> stands without UE support from mat working on speed, then an additional x5 reps on air ex. O2: 99%, HR: 85 bpm      Ambulation/Gait   Ambulation/Gait Yes    Ambulation/Gait Assistance 5: Supervision    Ambulation/Gait Assistance Details Around session with activities.    Assistive device None    Gait Pattern Step-through pattern;Decreased stance time - right;Decreased stance time - left;Decreased hip/knee flexion - right;Decreased hip/knee flexion - left;Left foot flat;Right foot flat    Ambulation Surface Level;Indoor    Gait velocity 15.44 seconds = 2.12 ft/sec      Knee/Hip Exercises: Aerobic   Nustep Completed activity/endurance training on NuStep on level 3.0 with BUE/BLE x 7 mins. Cues to keep spm between 35-40.      Knee/Hip Exercises: Standing   Forward Step Up Both;1 set;10 reps;Step Height: 6"    Forward Step Up Limitations Alternating legs, needing to use UE support with RLE due to knee discomfort                 Balance Exercises - 04/11/21 1254       Balance Exercises: Standing   SLS with Vectors Solid surface;Limitations    SLS with Vectors Limitations Alternating SLS taps to cones x10 reps, then forward/cross body cone taps x10 reps, needing cues for proper sequencing.    Rockerboard Anterior/posterior;Limitations    Rockerboard Limitations Weight shifting x15 reps with UE support > none - cues for full weight shift, keeping board still alternating UE lifts x5 reps each side, 2 x 10 reps head turns with intermitent taps to bars for balance.    Marching Forwards;Dynamic;Limitations    Marching Limitations In // bars down and back x4 reps on blue mat, cues for ROM                 PT Education - 04/11/21 1424     Education Details Provided handout on fall prevention and gave to pt and pt's daughter.    Person(s) Educated Patient    Methods Explanation;Handout    Comprehension Verbalized understanding              PT Short Term Goals - 04/11/21 1248       PT SHORT TERM GOAL #1   Title Pt will be independent with initial HEP with daughter's supervision in order to build upon functional gains made in therapy. ALL STGS DUE 04/11/21    Baseline pt reports performing at  home.    Time 3    Period Weeks    Status Achieved    Target Date 04/11/21      PT SHORT TERM GOAL #2   Title Pt and pt's daughter will verbalize understanding on fall prevention in the home.    Baseline provided handout on 04/11/21    Time 3    Period Weeks    Status Achieved      PT SHORT TERM GOAL #3   Title Pt will improve gait speed to at least 1.9 ft/sec with no AD vs. LRAD in order to demo decr fall risk.    Baseline 1.66 ft/sec; 15.44 seconds = 2.12 ft/sec on 04/11/21    Time 3    Period Weeks    Status Achieved      PT SHORT TERM GOAL #4   Title Pt will undergo further assessment of DGI with LTG written.    Baseline 14/24    Time 3    Period Weeks    Status Achieved               PT Long Term Goals - 04/04/21 1029       PT LONG TERM GOAL #1   Title Pt will be independent with final HEP with daughter's supervision in order to build upon functional gains made in therapy. ALL LTGS DUE 05/02/21    Time 6    Period Weeks    Status New      PT LONG TERM GOAL #2   Title Patient will improve DGI to >/= 18/24 to demo improved balance and reduced fall risk    Baseline 14/24    Time 6    Period Weeks    Status Revised      PT LONG TERM GOAL #3   Title Pt will improve 30 second chair stand to at least 10 sit <>stands to be above age related norm for improved leg strength.    Baseline 8 sit <> stands no UE support    Time 6    Period Weeks     Status New      PT LONG TERM GOAL #4   Title Pt will perform TUG in 11.5 seconds or less in order to demo decr fall risk.    Baseline 14.06 seconds    Time 6    Period Weeks    Status New      PT LONG TERM GOAL #5   Title Pt will improve gait speed to at least 2.2 ft/sec with no AD vs. LRAD in order to demo decr fall risk.    Baseline 1.66 ft/sec    Time 6    Period Weeks    Status New                   Plan - 04/11/21 1429     Clinical Impression Statement Pt met STG #3, improved gait speed to 2.12 ft/sec (previously was 1.66 ft/sec), indicating pt is at a decr risk of falls. Also provided handout to pt and pt's daughter on fall prevention in the home. Remainder of session focused on BLE strength, balance on unlevel surfaces, and SLS tasks. Pt tolerated session well with vitals stable. Will continue to progress towards LTGs.    Personal Factors and Comorbidities Comorbidity 3+;Past/Current Experience;Time since onset of injury/illness/exacerbation    Comorbidities Adenocarcinoma of left lung, stage 3, hx of radiation therapy (from 07/05/20 - 08/05/20), asthma, osteopenia    Examination-Activity Limitations  Locomotion Level;Transfers;Stairs    Examination-Participation Restrictions Driving;Community Activity;Meal Prep;Laundry    Stability/Clinical Decision Making Evolving/Moderate complexity    Rehab Potential Good    PT Frequency 2x / week    PT Duration 12 weeks    PT Treatment/Interventions ADLs/Self Care Home Management;DME Instruction;Gait training;Stair training;Functional mobility training;Therapeutic activities;Neuromuscular re-education;Balance training;Therapeutic exercise;Patient/family education;Passive range of motion;Vestibular    PT Next Visit Plan continue to progress HEP. SciFit/Nustep for endurance. continue SLS activities, continue gait working on dynamic balance, standing balance. working on increasing gait speed.    Consulted and Agree with Plan of Care  Patient;Family member/caregiver    Family Member Consulted pt's daughter.             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   Malignant neoplasm of bronchus and lung ([place])    Port-A-Cath in place 11/02/2020   Adenocarcinoma of left lung, stage 3 ([place]) 06/22/2020   Lung mass    Pulmonary nodule 05/17/2020   Iron deficiency 05/03/2020   Swelling of lower extremity 07/30/2019   Ganglion cyst of right foot 01/16/2019   HLD (hyperlipidemia) 01/16/2019   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 pylori infection    Other allergic rhinitis 03/20/2016   Malnutrition of moderate degree 04/11/2015   HTN (hypertension) 04/10/2015    Arliss Journey, PT, DPT  04/11/2021, 2:31 PM  [facility] [address] [place], [place], [address] Phone: [phone]   Fax:  [phone]  Name: Angelica [name] MRN: [id number] Date of Birth: 01-Apr-1941

## 2021-04-17 ENCOUNTER — Telehealth: Payer: Medicare HMO

## 2021-04-18 ENCOUNTER — Other Ambulatory Visit: Payer: Self-pay

## 2021-04-18 ENCOUNTER — Encounter: Payer: Self-pay | Admitting: Physical Therapy

## 2021-04-18 ENCOUNTER — Ambulatory Visit: Payer: Medicare HMO | Admitting: Physical Therapy

## 2021-04-18 DIAGNOSIS — M6281 Muscle weakness (generalized): Secondary | ICD-10-CM | POA: Diagnosis not present

## 2021-04-18 DIAGNOSIS — R2681 Unsteadiness on feet: Secondary | ICD-10-CM

## 2021-04-18 DIAGNOSIS — R2689 Other abnormalities of gait and mobility: Secondary | ICD-10-CM | POA: Diagnosis not present

## 2021-04-18 NOTE — Therapy (Signed)
[facility] [address] [place], [place], [address] Phone: [phone]   Fax:  [phone]  Physical Therapy Treatment  Patient Details  Name: Angelica [name] MRN: [id number] Date of Birth: [birth year] Referring Provider (PT): [name], MD   Encounter Date: 04/18/2021   PT End of Session - 04/18/21 1236     Visit Number 5    Number of Visits 13    Date for PT Re-Evaluation 06/19/21    Authorization Type [employer] - 12 visits approved 03/21/21 - 06/19/21    PT Start Time 1233    PT Stop Time 1313    PT Time Calculation (min) 40 min    Equipment Utilized During Treatment Gait belt    Activity Tolerance Patient tolerated treatment well    Behavior During Therapy [facility] for tasks assessed/performed             Past Medical History:  Diagnosis Date   Arthritis    Asthma    Bowel obstruction ([place])    Cancer ([place])    Environmental allergies    GERD (gastroesophageal reflux disease)    Glaucoma 05/09/2017   H. pylori infection    History of radiation therapy 07/05/20-08/05/20   Left lung IMRT Dr. Sondra Come    HLD (hyperlipidemia) 01/16/2019   Hypertension    Iron deficiency anemia    Osteopenia 05/26/2017   Thyroid disease    Urticaria 07/25/2018    Past Surgical History:  Procedure Laterality Date   ABDOMINAL HYSTERECTOMY     BOWEL RESECTION N/A 05/23/2020   Procedure: SMALL BOWEL REPAIR;  Surgeon: Kieth Brightly Arta Bruce, MD;  Location: [facility];  Service: General;  Laterality: N/A;   BREAST BIOPSY     BRONCHIAL BIOPSY  06/10/2020   Procedure: BRONCHIAL BIOPSIES;  Surgeon: Garner Nash, DO;  Location: [place] [facility];  Service: Pulmonary;;   BRONCHIAL BRUSHINGS  06/10/2020   Procedure: BRONCHIAL BRUSHINGS;  Surgeon: Garner Nash, DO;  Location: [facility] [facility];  Service: Pulmonary;;   BRONCHIAL NEEDLE ASPIRATION BIOPSY  06/10/2020   Procedure: BRONCHIAL NEEDLE ASPIRATION BIOPSIES;  Surgeon: Garner Nash,  DO;  Location: [facility] [facility];  Service: Pulmonary;;   BRONCHIAL WASHINGS  06/10/2020   Procedure: BRONCHIAL WASHINGS;  Surgeon: Garner Nash, DO;  Location: [place] [facility];  Service: Pulmonary;;   COLON SURGERY     DIAGNOSTIC LARYNGOSCOPY N/A 05/23/2020   Procedure: ATTEMPTED DIAGNOSTIC LAPAROSCOPY WITH ADHESIONS;  Surgeon: Kieth Brightly, Arta Bruce, MD;  Location: [place];  Service: General;  Laterality: N/A;   IR IMAGING GUIDED PORT INSERTION  07/15/2020   KNEE SURGERY     LAPAROTOMY N/A 04/18/2015   Procedure: Exploratory laparotomy with lysis of adhesions, possible bowel resection;  Surgeon: Ralene Ok, MD;  Location: [facility];  Service: General;  Laterality: N/A;   LAPAROTOMY N/A 05/23/2020   Procedure: EXPLORATORY LAPAROTOMY;  Surgeon: Mickeal Skinner, MD;  Location: [facility];  Service: General;  Laterality: N/A;   LYSIS OF ADHESION N/A 05/23/2020   Procedure: LYSIS OF ADHESION;  Surgeon: Kieth Brightly Arta Bruce, MD;  Location: [facility];  Service: General;  Laterality: N/A;   NASAL SINUS SURGERY     THYROID SURGERY     VIDEO BRONCHOSCOPY WITH ENDOBRONCHIAL NAVIGATION Bilateral 06/10/2020   Procedure: VIDEO BRONCHOSCOPY WITH ENDOBRONCHIAL NAVIGATION;  Surgeon: Garner Nash, DO;  Location: [facility];  Service: Pulmonary;  Laterality: Bilateral;   VIDEO BRONCHOSCOPY WITH ENDOBRONCHIAL ULTRASOUND  06/10/2020   Procedure: VIDEO BRONCHOSCOPY WITH ENDOBRONCHIAL  ULTRASOUND;  Surgeon: Garner Nash, DO;  Location: [facility];  Service: Pulmonary;;    There were no vitals filed for this visit.   Subjective Assessment - 04/18/21 1235     Subjective Nothing new. No falls. Doing more in the house.    Pertinent History Adenocarcinoma of left lung, stage 3, hx of radiation therapy (from 07/05/20 - 08/05/20), asthma, osteopenia    Limitations Walking    Patient Stated Goals wants to get her strength back up.    Currently in Pain? No/denies                               [facility]  Adult PT Treatment/Exercise - 04/18/21 1237       Knee/Hip Exercises: Aerobic   Nustep Completed activity/endurance training on NuStep on level 3.0 with BUE/BLE x 8 mins. Attempted at gear 4.0, but pt reporting too much pain in R knee. Felt better at gear 3.0. Cues to keep spm between 35-40. O2 after 99%, HR 85 bpm                 Balance Exercises - 04/18/21 1307       Balance Exercises: Standing   Standing Eyes Opened Narrow base of support (BOS);Foam/compliant surface    Standing Eyes Opened Limitations On air ex feet together x10 reps head turns, x10 reps head nods    Standing Eyes Closed Foam/compliant surface    Standing Eyes Closed Limitations Feet hip width on air ex 3 x 30 seconds, with wide BOS 2 x 5 reps head turns, 2 x 5 reps head nods. Incr difficulty with nods    SLS with Vectors Foam/compliant surface    SLS with Vectors Limitations On foam beam: alternating SLS taps x10 reps each leg without UE support, intermittent min guard for balance    Stepping Strategy Anterior;Posterior;Foam/compliant surface;Limitations    Stepping Strategy Limitations Standing on blue foam beam with no UE support, alternating legs in each direction x10 reps. cues for foot clearance    Sidestepping 4 reps;Limitations    Sidestepping Limitations On blue foam beam, down and back x4 reps with cues for foot clearance/SLS    Other Standing Exercises On air ex: 2 x 10 reps heel toe raises with BUE support                  PT Short Term Goals - 04/11/21 1248       PT SHORT TERM GOAL #1   Title Pt will be independent with initial HEP with daughter's supervision in order to build upon functional gains made in therapy. ALL STGS DUE 04/11/21    Baseline pt reports performing at home.    Time 3    Period Weeks    Status Achieved    Target Date 04/11/21      PT SHORT TERM GOAL #2   Title Pt and pt's daughter will verbalize understanding on fall prevention in the home.    Baseline  provided handout on 04/11/21    Time 3    Period Weeks    Status Achieved      PT SHORT TERM GOAL #3   Title Pt will improve gait speed to at least 1.9 ft/sec with no AD vs. LRAD in order to demo decr fall risk.    Baseline 1.66 ft/sec; 15.44 seconds = 2.12 ft/sec on 04/11/21    Time 3    Period Weeks  Status Achieved      PT SHORT TERM GOAL #4   Title Pt will undergo further assessment of DGI with LTG written.    Baseline 14/24    Time 3    Period Weeks    Status Achieved               PT Long Term Goals - 04/04/21 1029       PT LONG TERM GOAL #1   Title Pt will be independent with final HEP with daughter's supervision in order to build upon functional gains made in therapy. ALL LTGS DUE 05/02/21    Time 6    Period Weeks    Status New      PT LONG TERM GOAL #2   Title Patient will improve DGI to >/= 18/24 to demo improved balance and reduced fall risk    Baseline 14/24    Time 6    Period Weeks    Status Revised      PT LONG TERM GOAL #3   Title Pt will improve 30 second chair stand to at least 10 sit <>stands to be above age related norm for improved leg strength.    Baseline 8 sit <> stands no UE support    Time 6    Period Weeks    Status New      PT LONG TERM GOAL #4   Title Pt will perform TUG in 11.5 seconds or less in order to demo decr fall risk.    Baseline 14.06 seconds    Time 6    Period Weeks    Status New      PT LONG TERM GOAL #5   Title Pt will improve gait speed to at least 2.2 ft/sec with no AD vs. LRAD in order to demo decr fall risk.    Baseline 1.66 ft/sec    Time 6    Period Weeks    Status New                   Plan - 04/18/21 1346     Clinical Impression Statement Today's skilled session focused on balance on compliant surfaces and strengthening/endurance on NuStep. Pt tolerated session well. Challenged with balance with eyes closed on compliant surfaces, esp with addition of head nods. Will continue to progress  towards LTGs.    Personal Factors and Comorbidities Comorbidity 3+;Past/Current Experience;Time since onset of injury/illness/exacerbation    Comorbidities Adenocarcinoma of left lung, stage 3, hx of radiation therapy (from 07/05/20 - 08/05/20), asthma, osteopenia    Examination-Activity Limitations Locomotion Level;Transfers;Stairs    Examination-Participation Restrictions Driving;Community Activity;Meal Prep;Laundry    Stability/Clinical Decision Making Evolving/Moderate complexity    Rehab Potential Good    PT Frequency 2x / week    PT Duration 12 weeks    PT Treatment/Interventions ADLs/Self Care Home Management;DME Instruction;Gait training;Stair training;Functional mobility training;Therapeutic activities;Neuromuscular re-education;Balance training;Therapeutic exercise;Patient/family education;Passive range of motion;Vestibular    PT Next Visit Plan balance with eyes closed on unlevel surfaces. continue to progress HEP. SciFit/Nustep for endurance. continue SLS activities, continue gait working on dynamic balance, standing balance. working on increasing gait speed.    Consulted and Agree with Plan of Care Patient;Family member/caregiver    Family Member Consulted pt's daughter.             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   Malignant neoplasm of bronchus and lung ([place])    Port-A-Cath in place 11/02/2020   Adenocarcinoma of left lung, stage 3 ([place]) 06/22/2020   Lung mass    Pulmonary nodule 05/17/2020   Iron deficiency  05/03/2020   Swelling of lower extremity 07/30/2019   Ganglion cyst of right foot 01/16/2019   HLD (hyperlipidemia) 01/16/2019   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 pylori infection    Other allergic rhinitis 03/20/2016   Malnutrition of moderate degree 04/11/2015   HTN (hypertension) 04/10/2015    Arliss Journey, PT, DPT  04/18/2021, 1:47 PM  [facility] [address] [place], [place], [address] Phone: [phone]   Fax:  [phone]  Name: Angelica [name] MRN: [id number] Date of Birth: 1940-12-06

## 2021-04-20 ENCOUNTER — Telehealth: Payer: Self-pay | Admitting: Internal Medicine

## 2021-04-20 NOTE — Telephone Encounter (Signed)
Caller, patient's daughter [name] states patient has a persistent cough  Caller is requesting a call back to be advised on otc medication options due to patient's illness

## 2021-04-21 ENCOUNTER — Encounter: Payer: Self-pay | Admitting: Allergy

## 2021-04-21 ENCOUNTER — Ambulatory Visit (INDEPENDENT_AMBULATORY_CARE_PROVIDER_SITE_OTHER): Payer: Medicare HMO | Admitting: Allergy

## 2021-04-21 ENCOUNTER — Other Ambulatory Visit: Payer: Self-pay

## 2021-04-21 VITALS — BP 108/62 | HR 98 | Temp 98.5°F | Resp 16

## 2021-04-21 DIAGNOSIS — J310 Chronic rhinitis: Secondary | ICD-10-CM

## 2021-04-21 DIAGNOSIS — J339 Nasal polyp, unspecified: Secondary | ICD-10-CM | POA: Diagnosis not present

## 2021-04-21 DIAGNOSIS — J4550 Severe persistent asthma, uncomplicated: Secondary | ICD-10-CM | POA: Diagnosis not present

## 2021-04-21 DIAGNOSIS — L509 Urticaria, unspecified: Secondary | ICD-10-CM | POA: Diagnosis not present

## 2021-04-21 DIAGNOSIS — J3089 Other allergic rhinitis: Secondary | ICD-10-CM

## 2021-04-21 DIAGNOSIS — K219 Gastro-esophageal reflux disease without esophagitis: Secondary | ICD-10-CM | POA: Diagnosis not present

## 2021-04-21 MED ORDER — MONTELUKAST SODIUM 10 MG PO TABS: 10.0000 mg | ORAL_TABLET | Freq: Every day | ORAL | 5 refills | Status: DC

## 2021-04-21 MED ORDER — METHYLPREDNISOLONE ACETATE 40 MG/ML IJ SUSP
40.0000 mg | Freq: Once | INTRAMUSCULAR | Status: AC
  Administered 2021-04-21: 40 mg via INTRAMUSCULAR

## 2021-04-21 MED ORDER — BENZONATATE 100 MG PO CAPS: ORAL_CAPSULE | ORAL | 1 refills | Status: DC

## 2021-04-21 MED ORDER — BUDESONIDE-FORMOTEROL FUMARATE 80-4.5 MCG/ACT IN AERO
2.0000 | INHALATION_SPRAY | Freq: Two times a day (BID) | RESPIRATORY_TRACT | 5 refills | Status: DC
Start: 2021-04-21 — End: 2021-04-26

## 2021-04-21 MED ORDER — IPRATROPIUM BROMIDE 0.06 % NA SOLN
2.0000 | Freq: Four times a day (QID) | NASAL | 5 refills | Status: DC | PRN
Start: 2021-04-21 — End: 2022-01-05

## 2021-04-21 NOTE — Patient Instructions (Addendum)
  1. Continue Symbicort 2 puffs twice a day for asthma maintenance control  2. Continue Fasenra injections every 8 weeks.     3. Use Ipratropium 0.06% 2 sprays each nostril as needed for nasal drainage  4. Continue Singulair and Claritin daily  5. For severe nasal congestion recommend the following:       - depomedrol injection 40mg  provided in office today.   Let me know how long of an effect this has on her nasal congestion       - will call ENT office to get name of the nasal spray compound recommended   6.  If needed:   A.  Ventolin HFA 2 puffs or albuterol nebulization every 4-6 hours  B.  Nasal saline spray to help moisturize nose     7. Return to clinic in 4-6 months or earlier if problem

## 2021-04-21 NOTE — Telephone Encounter (Signed)
Ok to try Tessalon perle prn - I will send rx

## 2021-04-21 NOTE — Progress Notes (Signed)
Follow-up Note  RE: Angelica [name] MRN: [id number] DOB: 06-13-1940 Date of Office Visit: 04/21/2021   History of present illness: Angelica [name] is a 80 y.o. female presenting today for follow-up of chronic allergic rhinitis, severe persistent asthma, LPRD, nasal polyposis and history of urticaria.  She was last seen in the office on 02/02/2021 by myself.  She presents today with 1 of her daughters.  She has been having a lot of issues with nasal congestion that is impacting her quality of life.  At the last visit I did give her a 5-day prednisone burst to help with this congestion.  Her daughter states in about 2 days the congestion was greatly improved.  I also sent in a ENT referral and she was able to go on 02/14/2021 where she saw Dr. Ilda Foil.   Her daughter states they recommended a compounded nasal spray however this medication can only be made at a specialty pharmacy and it was going to cost too much money to have it made.  Thus they did not go through with this therapy.  Upon review of the ENT clinic note I am not sure what this nasal spray is.  Her daugther states that steam treatments do help.  They are interested in using a humidifier.  Her daughter is interested if she can receive any further systemic steroids to help with her congestion.  She has not had good response with use of traditional nasal steroid sprays like Flonase, also has not had any good response with congestion to nasal ipratropium; Xhance she was not able to properly perform the maneuvers for the device.  She does continue to take Singulair daily.  She also uses ipratropium nasal spray to help with drainage component that she has. Not had allergy testing in about 20 years or so.  Thus family is interesting in repeating this to see if she is has any allergy component at this time. Her daughter states that her asthma has been under good control she has not had any significant day or nighttime symptoms.  She does continue to  take Fasenra every 8 weeks.  She also uses Symbicort twice a day and singular daily.   She has not had any further hive episodes.  She also states that her reflux has not been an issue.  Review of systems: Review of Systems  Constitutional: Negative.   HENT:  Positive for congestion.   Eyes: Negative.   Respiratory: Negative.    Cardiovascular: Negative.   Gastrointestinal: Negative.   Musculoskeletal: Negative.   Allergic/Immunologic: Negative.   Neurological: Negative.     All other systems negative unless noted above in HPI  Past medical/social/surgical/family history have been reviewed and are unchanged unless specifically indicated below.  No changes  Medication List: Current Outpatient Medications  Medication Sig Dispense Refill   acetaminophen (TYLENOL) 325 MG tablet Take 1 tablet (325 mg total) by mouth every 6 (six) hours as needed for headache or mild pain.     alum & mag hydroxide-simeth (MAALOX/MYLANTA) 200-200-20 MG/5ML suspension Take 15 mLs by mouth every 6 (six) hours as needed for indigestion or heartburn.     amLODipine (NORVASC) 5 MG tablet Take 1 tablet (5 mg total) by mouth daily. 90 tablet 3   Benralizumab (FASENRA PEN) 30 MG/ML SOAJ Inject into the skin every 8 (eight) weeks.     budesonide-formoterol (SYMBICORT) 80-4.5 MCG/ACT inhaler Inhale 2 puffs into the lungs 2 (two) times daily. Use for asthma flares 2 puffs  twice a day for 2 weeks or until cough and wheeze free 10.2 g 5   Cholecalciferol (VITAMIN D3) 50 MCG (2000 UT) TABS Take 2,000 Units by mouth daily.     diclofenac Sodium (VOLTAREN) 1 % GEL Apply 4 g topically 4 (four) times daily. 100 g 0   EPINEPHrine (EPIPEN 2-PAK) 0.3 mg/0.3 mL IJ SOAJ injection Use as directed for severe allergic reaction 2 each 1   fluticasone (FLONASE) 50 MCG/ACT nasal spray Place 2 sprays into both nostrils daily. 16 g 5   ipratropium (ATROVENT) 0.06 % nasal spray USE 2 SPRAY(S) IN EACH NOSTRIL THREE TIMES DAILY 15 mL 5    linaclotide (LINZESS) 72 MCG capsule TAKE 1 CAPSULE BY MOUTH ONCE DAILY AS NEEDED (Patient taking differently: Take 72 mcg by mouth daily as needed (constipation).) 30 capsule 0   loratadine (CLARITIN) 10 MG tablet Take 10 mg by mouth daily.     Magnesium Hydroxide (PHILLIPS MILK OF MAGNESIA PO) Take 15-30 mLs by mouth daily as needed (for constipation).     montelukast (SINGULAIR) 10 MG tablet Take 1 tablet (10 mg total) by mouth at bedtime. 30 tablet 5   ondansetron (ZOFRAN) 8 MG tablet Take 1 tablet (8 mg total) by mouth every 8 (eight) hours as needed for nausea or vomiting. 20 tablet 0   polyethylene glycol (MIRALAX / GLYCOLAX) 17 g packet Take 17 g by mouth daily as needed for mild constipation.     pravastatin (PRAVACHOL) 80 MG tablet Take 1 tablet (80 mg total) by mouth daily. 90 tablet 3   vitamin B-12 (CYANOCOBALAMIN) 1000 MCG tablet Take 1,000 mcg by mouth daily.     Current Facility-Administered Medications  Medication Dose Route Frequency Provider Last Rate Last Admin   Benralizumab SOSY 30 mg  30 mg Subcutaneous Q8 Weeks Kennith Gain, MD   30 mg at 04/11/21 7209     Known medication allergies: Allergies  Allergen Reactions   Asa Buff (Mag [Buffered Aspirin] Other (See Comments)    Excessive sweating   Ensure [Nutritional Supplements]     Or boost - upset stomach    Fish Allergy Other (See Comments)    Unknown reaction, but allergic   Other Other (See Comments)    Gelcaps; gel-containing capsules; extended-release medication - upset stomach    Peanut-Containing Drug Products Other (See Comments)    From allergy test   Penicillins Itching    Has patient had a PCN reaction causing immediate rash, facial/tongue/throat swelling, SOB or lightheadedness with hypotension: No Has patient had a PCN reaction causing severe rash involving mucus membranes or skin necrosis: No Has patient had a PCN reaction that required hospitalization No Has patient had a PCN reaction  occurring within the last 10 years: No If all of the above answers are "NO", then may proceed with Cephalosporin use.   Shellfish-Derived Products Other (See Comments)    From allergy test   Strawberry Extract Other (See Comments)    From allergy test      Physical examination: Blood pressure 108/62, pulse 98, temperature 98.5 F (36.9 C), temperature source Temporal, resp. rate 16, SpO2 98 %.  General: Alert, interactive, in no acute distress. HEENT: PERRLA, TMs pearly gray, turbinates markedly edematous without discharge, post-pharynx non erythematous. Neck: Supple without lymphadenopathy. Lungs: Clear to auscultation without wheezing, rhonchi or rales. {no increased work of breathing. CV: Normal S1, S2 without murmurs. Abdomen: Nondistended, nontender. Skin: Warm and dry, without lesions or rashes. Extremities:  No clubbing,  cyanosis or edema. Neuro:   Grossly intact.  Diagnositics/Labs: Depo-Medrol 40 mg injection provided today  Assessment and plan: Allergic rhinitis/chronic rhinitis-nasal obstruction impacting quality of life.  She has had an ENT evaluation.  She has not had good response with standard treatment including nasal steroid sprays, nasal anticholinergic and allergy based medicines.  She did respond well to prednisone burst provided at last visit.  I did discuss the side effects related to systemic steroids.  Decision made today with patient and her daughters to proceed with Depo-Medrol injection with hopes that this will provide some longer lasting nasal congestion time control.  I am curious to know what the medication that was recommended by the ENT is I will have my office call and see if we can get this information. We are obtaining environmental allergy panel at this time to see if she continues to have IgE to environmental allergens. Severe persistent asthma -at this time she remains under good control on her ICS/LABA regimen as well as biologic agent  Fasenra LPRD -asymptomatic at this time off antireflux medication Nasal polyposis, history of-plan as above Urticaria -has not had any further episodes    1. Continue Symbicort 80 mcg 2 puffs twice a day for asthma maintenance control  2. Continue Fasenra injections every 8 weeks.     3. Use Ipratropium 0.06% 2 sprays each nostril as needed for nasal drainage  4. Continue Singulair and Claritin daily  5. For severe nasal congestion recommend the following:       - depomedrol injection 40mg  provided in office today.   Let me know how long of an effect this has on her nasal congestion       - will call ENT office to get name of the nasal spray compound recommended   6.  If needed:   A.  Ventolin HFA 2 puffs or albuterol nebulization every 4-6 hours  B.  Nasal saline spray to help moisturize nose     7. Return to clinic in 4-6 months or earlier if problem   I appreciate the opportunity to take part in Angelica [name]'s care. Please do not hesitate to contact me with questions.  Sincerely,   Prudy Feeler, MD Allergy/Immunology [facility]

## 2021-04-21 NOTE — Telephone Encounter (Signed)
Patient's daughter notified.

## 2021-04-24 ENCOUNTER — Telehealth: Payer: Self-pay

## 2021-04-24 NOTE — Telephone Encounter (Signed)
Unfortunately it is too late to call the ENT this evening as they have closed. Will need to call tomorrow 04/25/2021.

## 2021-04-24 NOTE — Telephone Encounter (Signed)
-----   Message from [place], MD sent at 04/21/2021  5:54 PM EST ----- Regarding: Call ENT She saw ENT, Dr Ilda Foil, in September 2022. Daughter advised she was prescribed a compounded nasal spray that was sent to a specialty pharmacy in [place].  This medication was too expensive for them to get.  The daughter does not know the name of this spray or what the compound the ingredients are.  I looked through the ENT clinic note and do not see mention of this in there. Can someone please call the ENTs office and see if they can tell us the name of this nasal spray so I can see if it something that I can help assist with or modify the regimen.  Thanks

## 2021-04-25 ENCOUNTER — Telehealth: Payer: Medicare HMO | Admitting: Physician Assistant

## 2021-04-25 ENCOUNTER — Other Ambulatory Visit: Payer: Self-pay

## 2021-04-25 ENCOUNTER — Ambulatory Visit: Payer: Medicare HMO | Admitting: Physical Therapy

## 2021-04-25 ENCOUNTER — Telehealth: Payer: Self-pay | Admitting: Internal Medicine

## 2021-04-25 ENCOUNTER — Telehealth (INDEPENDENT_AMBULATORY_CARE_PROVIDER_SITE_OTHER): Payer: Medicare HMO | Admitting: Family Medicine

## 2021-04-25 VITALS — BP 125/86 | HR 79 | Temp 97.1°F | Ht 65.0 in

## 2021-04-25 DIAGNOSIS — U071 COVID-19: Secondary | ICD-10-CM | POA: Insufficient documentation

## 2021-04-25 LAB — ALLERGENS W/TOTAL IGE AREA 2

## 2021-04-25 MED ORDER — NIRMATRELVIR/RITONAVIR (PAXLOVID)TABLET: 3.0000 | ORAL_TABLET | Freq: Two times a day (BID) | ORAL | 0 refills | Status: AC

## 2021-04-25 MED ORDER — MOLNUPIRAVIR EUA 200MG CAPSULE: 4.0000 | ORAL_CAPSULE | Freq: Two times a day (BID) | ORAL | 0 refills | Status: AC

## 2021-04-25 NOTE — Telephone Encounter (Signed)
Caller, patient's daughter [name] states patient was prescribed  molnupiravir EUA (LAGEVRIO) 200 mg CAPS capsule for Covid on 12-6  Caller states patient is unable to swallow the capsules due to the large size  Caller requesting a call back to discuss alternative antiviral medications

## 2021-04-25 NOTE — Assessment & Plan Note (Signed)
MIld symtpoms COVID19  Infection < 5 days from onset of symptoms in 3 X vaccinated overweight individual with history of advanced age, asthma, heart disease and lung cancer.  No clear sign of bacterial infection at this time.   No SOB.  No red flags/need for ER visit or in-person exam at respiratory clinic at this time..    Pt higher risk for COVID complications given  age, heart and lung history . Paxlovid contraindicated... treat with molnupiravir.  Start molnupiravir 5 day course. Reviewed course of medication and side effect profile with patient in detail.   Symptomatic care with mucinex and cough suppressant at night. If SOB begins symptoms worsening.. have low threshold for in-person exam, if severe shortness of breath ER visit recommended.  Can monitor Oxygen saturation at home with home monitor if able to obtain.  Go to ER if O2 sat < 90% on room air.   Reviewed home care and provided information through [facility].  Recommended quarantine 5 days isolation recommended. Return to work day 6 and wear mask for 4 more days to complete 10 days. Provided info about prevention of spread of COVID 19.

## 2021-04-25 NOTE — Telephone Encounter (Signed)
Dr Padgett please advise 

## 2021-04-25 NOTE — Telephone Encounter (Signed)
Ok paxlovid has been done erx, hopefully this is smaller capsules

## 2021-04-25 NOTE — Progress Notes (Signed)
VIRTUAL VISIT Due to national recommendations of social distancing due to [place] 19, a virtual visit is felt to be most appropriate for this patient at this time.   I connected with the patient on 04/25/21 at 10:40 AM EST by virtual telehealth platform and verified that I am speaking with the correct person using two identifiers.   I discussed the limitations, risks, security and privacy concerns of performing an evaluation and management service by  virtual telehealth platform and the availability of in person appointments. I also discussed with the patient that there may be a patient responsible charge related to this service. The patient expressed understanding and agreed to proceed.  Patient location: Home Provider Location: [facility] Participants: Angelica [name] and Angelica [name]   Chief Complaint  Patient presents with   Covid Positive    Positive home test this morning-   Nasal Congestion   Fatigue    History of Present Illness:  80 year old female with HTN, lung cancer, severe asthma presents with new onset COVID infection  She is with daughter Angelica [name].   Date of onset: 04/24/2021 She has had some congestion and fatigue/weakness.  No cough, no SOB. No wheeze. No fever. No chills.   Given steroid injection Dr. Nelva Bush Asthma Allergy doctor. Did not help much.    COVID 19 screen COVID testing:04/25/2021 COVID vaccine: 02/16/2020 , 08/03/2019 , 07/13/2019 COVID exposure: No recent travel or known exposure to [place].. grandson not feeling well.. positive. The importance of social distancing was discussed today.    Review of Systems  Constitutional:  Negative for chills and fever.  HENT:  Positive for congestion. Negative for ear pain.   Eyes:  Negative for pain and redness.  Respiratory:  Negative for cough and shortness of breath.   Cardiovascular:  Negative for chest pain, palpitations and leg swelling.  Gastrointestinal:  Negative for abdominal pain, blood in  stool, constipation, diarrhea, nausea and vomiting.  Genitourinary:  Negative for dysuria.  Musculoskeletal:  Negative for falls and myalgias.  Skin:  Negative for rash.  Neurological:  Negative for dizziness.  Psychiatric/Behavioral:  Negative for depression. The patient is not nervous/anxious.      Past Medical History:  Diagnosis Date   Arthritis    Asthma    Bowel obstruction (HCC)    Cancer (HCC)    Environmental allergies    GERD (gastroesophageal reflux disease)    Glaucoma 05/09/2017   H. pylori infection    History of radiation therapy 07/05/20-08/05/20   Left lung IMRT Dr. Sondra Come    HLD (hyperlipidemia) 01/16/2019   Hypertension    Iron deficiency anemia    Osteopenia 05/26/2017   Thyroid disease    Urticaria 07/25/2018    reports that she has quit smoking. Her smoking use included cigarettes. She has never used smokeless tobacco. She reports that she does not drink alcohol and does not use drugs.   Current Outpatient Medications:    acetaminophen (TYLENOL) 325 MG tablet, Take 1 tablet (325 mg total) by mouth every 6 (six) hours as needed for headache or mild pain., Disp: , Rfl:    alum & mag hydroxide-simeth (MAALOX/MYLANTA) 200-200-20 MG/5ML suspension, Take 15 mLs by mouth every 6 (six) hours as needed for indigestion or heartburn., Disp: , Rfl:    amLODipine (NORVASC) 5 MG tablet, Take 1 tablet (5 mg total) by mouth daily., Disp: 90 tablet, Rfl: 3   Benralizumab (FASENRA PEN) 30 MG/ML SOAJ, Inject into the skin every 8 (eight)  weeks., Disp: , Rfl:    benzonatate (TESSALON PERLES) 100 MG capsule, 1-2 tabs by mouth every 8 hrs as needed for cough, Disp: 60 capsule, Rfl: 1   budesonide-formoterol (SYMBICORT) 80-4.5 MCG/ACT inhaler, Inhale 2 puffs into the lungs 2 (two) times daily., Disp: 10.2 g, Rfl: 5   Cholecalciferol (VITAMIN D3) 50 MCG (2000 UT) TABS, Take 2,000 Units by mouth daily., Disp: , Rfl:    diclofenac Sodium (VOLTAREN) 1 % GEL, Apply 4 g topically 4 (four)  times daily., Disp: 100 g, Rfl: 0   EPINEPHrine (EPIPEN 2-PAK) 0.3 mg/0.3 mL IJ SOAJ injection, Use as directed for severe allergic reaction, Disp: 2 each, Rfl: 1   ipratropium (ATROVENT) 0.06 % nasal spray, Place 2 sprays into both nostrils 4 (four) times daily as needed (nasal drainage)., Disp: 15 mL, Rfl: 5   linaclotide (LINZESS) 72 MCG capsule, TAKE 1 CAPSULE BY MOUTH ONCE DAILY AS NEEDED, Disp: 30 capsule, Rfl: 0   loratadine (CLARITIN) 10 MG tablet, Take 10 mg by mouth daily., Disp: , Rfl:    Magnesium Hydroxide (PHILLIPS MILK OF MAGNESIA PO), Take 15-30 mLs by mouth daily as needed (for constipation)., Disp: , Rfl:    montelukast (SINGULAIR) 10 MG tablet, Take 1 tablet (10 mg total) by mouth at bedtime., Disp: 30 tablet, Rfl: 5   ondansetron (ZOFRAN) 8 MG tablet, Take 1 tablet (8 mg total) by mouth every 8 (eight) hours as needed for nausea or vomiting., Disp: 20 tablet, Rfl: 0   polyethylene glycol (MIRALAX / GLYCOLAX) 17 g packet, Take 17 g by mouth daily as needed for mild constipation., Disp: , Rfl:    pravastatin (PRAVACHOL) 80 MG tablet, Take 1 tablet (80 mg total) by mouth daily., Disp: 90 tablet, Rfl: 3   vitamin B-12 (CYANOCOBALAMIN) 1000 MCG tablet, Take 1,000 mcg by mouth daily., Disp: , Rfl:   Current Facility-Administered Medications:    Benralizumab SOSY 30 mg, 30 mg, Subcutaneous, Q8 Weeks, Padgett, Rae Halsted, MD, 30 mg at 04/11/21 3151   Observations/Objective: Blood pressure 125/86, pulse 79, temperature (!) 97.1 F (36.2 C), temperature source Oral, height 5\' 5"  (1.651 m), SpO2 99 %.  Physical Exam  Physical Exam Constitutional:      General: The patient is not in acute distress. Pulmonary:     Effort: Pulmonary effort is normal. No respiratory distress.  Neurological:     Mental Status: The patient is alert and oriented to person, place, and time.  Psychiatric:        Mood and Affect: Mood normal.        Behavior: Behavior normal.   Assessment and  Plan    Problem List Items Addressed This Visit     COVID-19 - Primary    MIld symtpoms COVID19  Infection < 5 days from onset of symptoms in 3 X vaccinated overweight individual with history of advanced age, asthma, heart disease and lung cancer.  No clear sign of bacterial infection at this time.   No SOB.  No red flags/need for ER visit or in-person exam at respiratory clinic at this time..    Pt higher risk for COVID complications given  age, heart and lung history . Paxlovid contraindicated... treat with molnupiravir.  Start molnupiravir 5 day course. Reviewed course of medication and side effect profile with patient in detail.   Symptomatic care with mucinex and cough suppressant at night. If SOB begins symptoms worsening.. have low threshold for in-person exam, if severe shortness of breath ER visit recommended.  Can monitor Oxygen saturation at home with home monitor if able to obtain.  Go to ER if O2 sat < 90% on room air.   Reviewed home care and provided information through [facility].  Recommended quarantine 5 days isolation recommended. Return to work day 6 and wear mask for 4 more days to complete 10 days. Provided info about prevention of spread of COVID 19.       Relevant Medications   molnupiravir EUA (LAGEVRIO) 200 mg CAPS capsule    I discussed the assessment and treatment plan with the patient. The patient was provided an opportunity to ask questions and all were answered. The patient agreed with the plan and demonstrated an understanding of the instructions.   The patient was advised to call back or seek an in-person evaluation if the symptoms worsen or if the condition fails to improve as anticipated.     Angelica Lofts, MD

## 2021-04-25 NOTE — Telephone Encounter (Signed)
Spoke to Dr. Ilda Foil assistant and she stated the nasal rinse was compounded with Budesonide and Levofloxain. She also stated after she couldn't afford the nasal spray they then prescribed her prednisone tablets and Nasacort nasal spray.  Dr. Ilda Foil [phone]

## 2021-04-25 NOTE — Progress Notes (Signed)
Message sent to patient requesting further input regarding current symptoms. Awaiting patient response.  

## 2021-04-26 ENCOUNTER — Inpatient Hospital Stay: Admission: RE | Admit: 2021-04-26 | Payer: Medicare HMO | Source: Ambulatory Visit

## 2021-04-26 ENCOUNTER — Other Ambulatory Visit: Payer: Self-pay

## 2021-04-26 ENCOUNTER — Ambulatory Visit: Payer: Medicare HMO | Admitting: *Deleted

## 2021-04-26 DIAGNOSIS — C3492 Malignant neoplasm of unspecified part of left bronchus or lung: Secondary | ICD-10-CM

## 2021-04-26 DIAGNOSIS — I10 Essential (primary) hypertension: Secondary | ICD-10-CM

## 2021-04-26 DIAGNOSIS — E785 Hyperlipidemia, unspecified: Secondary | ICD-10-CM

## 2021-04-26 MED ORDER — BUDESONIDE-FORMOTEROL FUMARATE 80-4.5 MCG/ACT IN AERO
2.0000 | INHALATION_SPRAY | Freq: Two times a day (BID) | RESPIRATORY_TRACT | 5 refills | Status: DC
Start: 2021-04-26 — End: 2022-01-04

## 2021-04-26 NOTE — Patient Instructions (Addendum)
Visit Information  Thank you for taking time to talk with me today. Please don't hesitate to contact me if I can be of assistance to you before our next scheduled telephone appointment.  Below are the goals we discussed today:  Patient Self-Care Activities: Patient Marlies/ Caregiver [name] will ensure that patient will: Take medications as prescribed Attend all scheduled provider appointments Call pharmacy for medication refills Call provider office for new concerns or questions Continue to monitor and write down on paper weekly blood pressures at home Continue to follow low-salt heart healthy diet    Continue to stay as active as possible Continue to take efforts to prevent falls  Our next scheduled telephone follow up visit/ appointment with care management team member is scheduled on:  Wednesday, May 31, 2021 at 9:00 am  If you need to cancel or re-schedule our visit, please call [phone] and our care guide team will be happy to assist you.   I look forward to hearing about your progress.   [name], RN, BSN, [place] [phone]: direct office  Patient's caregiver verbalizes understanding of instructions provided today and agrees to view in [place]  If you are experiencing a Mental Health or [place] or need someone to talk to, please  call the Suicide and Crisis Lifeline: 988 call the [place] National Suicide Prevention Lifeline: 458-760-3236 or TTY: 6075723071 TTY (915)719-4861) to talk to a trained counselor call 1-800-273-TALK (toll free, 24 hour hotline) go to [facility] [phone]) call 911   10 Things You Can Do to Manage Your COVID-19 Symptoms at Home If you have possible or confirmed COVID-19 Stay home except to get medical care. Monitor your symptoms carefully. If your symptoms get worse, call your  healthcare provider immediately. Get rest and stay hydrated. If you have a medical appointment, call the healthcare provider ahead of time and tell them that you have or may have COVID-19. For medical emergencies, call 911 and notify the dispatch personnel that you have or may have COVID-19. Cover your cough and sneezes with a tissue or use the inside of your elbow. Wash your hands often with soap and water for at least 20 seconds or clean your hands with an alcohol-based hand sanitizer that contains at least 60% alcohol. As much as possible, stay in a specific room and away from other people in your home. Also, you should use a separate bathroom, if available. If you need to be around other people in or outside of the home, wear a mask. Avoid sharing personal items with other people in your household, like dishes, towels, and bedding. Clean all surfaces that are touched often, like counters, tabletops, and doorknobs. Use household cleaning sprays or wipes according to the label instructions. [email or web address] 12/04/2019 This information is not intended to replace advice given to you by your health care provider. Make sure you discuss any questions you have with your health care provider. Document Revised: 01/27/2021 Document Reviewed: 01/27/2021 Elsevier Patient Education  2022 [employer] Oral Capsules What is this medication? MOLNUPIRAVIR (mol nue pir a vir) treats COVID-19. It is an antiviral medication. It may decrease the risk of developing severe symptoms of COVID-19. It may also decrease the chance of going to the hospital. This medication is not approved by the FDA. The FDA has authorized emergency use of this medication during the COVID-19 pandemic. This medicine may be  used for other purposes; ask your health care provider or pharmacist if you have questions. COMMON BRAND NAME(S): LAGEVRIO What should I tell my care team before I take this medication? They need to  know if you have any of these conditions: Any allergies Any serious illness An unusual or allergic reaction to molnupiravir, other medications, foods, dyes, or preservatives Pregnant or trying to get pregnant Breast-feeding How should I use this medication? Take this medication by mouth with water. Take it as directed on the prescription label at the same time every day. Do not cut, crush or chew this medication. Swallow the capsules whole. You can take it with or without food. If it upsets your stomach, take it with food. Take all of this medication unless your care team tells you to stop it early. Keep taking it even if you think you are better. Talk to your care team about the use of this medication in children. Special care may be needed. Overdosage: If you think you have taken too much of this medicine contact a poison control center or emergency room at once. NOTE: This medicine is only for you. Do not share this medicine with others. What if I miss a dose? If you miss a dose, take it as soon as you can unless it is more than 10 hours late. If it is more than 10 hours late, skip the missed dose. Take the next dose at the normal time. Do not take extra or 2 doses at the same time to make up for the missed dose. What may interact with this medication? Interactions have not been studied. This list may not describe all possible interactions. Give your health care provider a list of all the medicines, herbs, non-prescription drugs, or dietary supplements you use. Also tell them if you smoke, drink alcohol, or use illegal drugs. Some items may interact with your medicine. What should I watch for while using this medication? Your condition will be monitored carefully while you are receiving this medication. Visit your care team for regular checkups. Tell your care team if your symptoms do not start to get better or if they get worse. Do not become pregnant while taking this medication. You may need  a pregnancy test before starting this medication. Women must use a reliable form of birth control while taking this medication and for 4 days after stopping the medication. Women should inform their care team if they wish to become pregnant or think they might be pregnant. Men should not father a child while taking this medication and for 3 months after stopping it. There is potential for serious harm to an unborn child. Talk to your care team for more information. Do not breast-feed an infant while taking this medication and for 4 days after stopping the medication. What side effects may I notice from receiving this medication? Side effects that you should report to your care team as soon as possible: Allergic reactions--skin rash, itching, hives, swelling of the face, lips, tongue, or throat Side effects that usually do not require medical attention (report these to your care team if they continue or are bothersome): Diarrhea Dizziness Nausea This list may not describe all possible side effects. Call your doctor for medical advice about side effects. You may report side effects to FDA at 1-800-FDA-1088. Where should I keep my medication? Keep out of the reach of children and pets. Store at room temperature between 20 and 25 degrees C (68 and 77 degrees F). Get rid  of any unused medication after the expiration date. To get rid of medications that are no longer needed or have expired: Take the medication to a medication take-back program. Check with your pharmacy or law enforcement to find a location. If you cannot return the medication, check the label or package insert to see if the medication should be thrown out in the garbage or flushed down the toilet. If you are not sure, ask your care team. If it is safe to put it in the trash, take the medication out of the container. Mix the medication with cat litter, dirt, coffee grounds, or other unwanted substance. Seal the mixture in a bag or container.  Put it in the trash. NOTE: This sheet is a summary. It may not cover all possible information. If you have questions about this medicine, talk to your doctor, pharmacist, or health care provider.  2022 Elsevier/Gold Standard (2020-05-16 00:00:00)  COVID-19: What to Do if You Are Sick If you test positive and are an older adult or someone who is at high risk of getting very sick from COVID-19, treatment may be available. Contact a healthcare provider right away after a positive test to determine if you are eligible, even if your symptoms are mild right now. You can also visit a Test to Treat location and, if eligible, receive a prescription from a provider. Don't delay: Treatment must be started within the first few days to be effective. If you have a fever, cough, or other symptoms, you might have COVID-19. Most people have mild illness and are able to recover at home. If you are sick: Keep track of your symptoms. If you have an emergency warning sign (including trouble breathing), call 911. Steps to help prevent the spread of COVID-19 if you are sick If you are sick with COVID-19 or think you might have COVID-19, follow the steps below to care for yourself and to help protect other people in your home and community. Stay home except to get medical care Stay home. Most people with COVID-19 have mild illness and can recover at home without medical care. Do not leave your home, except to get medical care. Do not visit public areas and do not go to places where you are unable to wear a mask. Take care of yourself. Get rest and stay hydrated. Take over-the-counter medicines, such as acetaminophen, to help you feel better. Stay in touch with your doctor. Call before you get medical care. Be sure to get care if you have trouble breathing, or have any other emergency warning signs, or if you think it is an emergency. Avoid public transportation, ride-sharing, or taxis if possible. Get tested If you have  symptoms of COVID-19, get tested. While waiting for test results, stay away from others, including staying apart from those living in your household. Get tested as soon as possible after your symptoms start. Treatments may be available for people with COVID-19 who are at risk for becoming very sick. Don't delay: Treatment must be started early to be effective--some treatments must begin within 5 days of your first symptoms. Contact your healthcare provider right away if your test result is positive to determine if you are eligible. Self-tests are one of several options for testing for the virus that causes COVID-19 and may be more convenient than laboratory-based tests and point-of-care tests. Ask your healthcare provider or your local health department if you need help interpreting your test results. You can visit your state, tribal, local, and territorial health  department's website to look for the latest local information on testing sites. Separate yourself from other people As much as possible, stay in a specific room and away from other people and pets in your home. If possible, you should use a separate bathroom. If you need to be around other people or animals in or outside of the home, wear a well-fitting mask. Tell your close contacts that they may have been exposed to COVID-19. An infected person can spread COVID-19 starting 48 hours (or 2 days) before the person has any symptoms or tests positive. By letting your close contacts know they may have been exposed to COVID-19, you are helping to protect everyone. See COVID-19 and Animals if you have questions about pets. If you are diagnosed with COVID-19, someone from the health department may call you. Answer the call to slow the spread. Monitor your symptoms Symptoms of COVID-19 include fever, cough, or other symptoms. Follow care instructions from your healthcare provider and local health department. Your local health authorities may give  instructions on checking your symptoms and reporting information. When to seek emergency medical attention Look for emergency warning signs* for COVID-19. If someone is showing any of these signs, seek emergency medical care immediately: Trouble breathing Persistent pain or pressure in the chest New confusion Inability to wake or stay awake Pale, gray, or blue-colored skin, lips, or nail beds, depending on skin tone *This list is not all possible symptoms. Please call your medical provider for any other symptoms that are severe or concerning to you. Call 911 or call ahead to your local emergency facility: Notify the operator that you are seeking care for someone who has or may have COVID-19. Call ahead before visiting your doctor Call ahead. Many medical visits for routine care are being postponed or done by phone or telemedicine. If you have a medical appointment that cannot be postponed, call your doctor's office, and tell them you have or may have COVID-19. This will help the office protect themselves and other patients. If you are sick, wear a well-fitting mask You should wear a mask if you must be around other people or animals, including pets (even at home). Wear a mask with the best fit, protection, and comfort for you. You don't need to wear the mask if you are alone. If you can't put on a mask (because of trouble breathing, for example), cover your coughs and sneezes in some other way. Try to stay at least 6 feet away from other people. This will help protect the people around you. Masks should not be placed on young children under age 104 years, anyone who has trouble breathing, or anyone who is not able to remove the mask without help. Cover your coughs and sneezes Cover your mouth and nose with a tissue when you cough or sneeze. Throw away used tissues in a lined trash can. Immediately wash your hands with soap and water for at least 20 seconds. If soap and water are not available,  clean your hands with an alcohol-based hand sanitizer that contains at least 60% alcohol. Clean your hands often Wash your hands often with soap and water for at least 20 seconds. This is especially important after blowing your nose, coughing, or sneezing; going to the bathroom; and before eating or preparing food. Use hand sanitizer if soap and water are not available. Use an alcohol-based hand sanitizer with at least 60% alcohol, covering all surfaces of your hands and rubbing them together until they feel dry.  Soap and water are the best option, especially if hands are visibly dirty. Avoid touching your eyes, nose, and mouth with unwashed hands. Handwashing Tips Avoid sharing personal household items Do not share dishes, drinking glasses, cups, eating utensils, towels, or bedding with other people in your home. Wash these items thoroughly after using them with soap and water or put in the dishwasher. Clean surfaces in your home regularly Clean and disinfect high-touch surfaces (for example, doorknobs, tables, handles, light switches, and countertops) in your "sick room" and bathroom. In shared spaces, you should clean and disinfect surfaces and items after each use by the person who is ill. If you are sick and cannot clean, a caregiver or other person should only clean and disinfect the area around you (such as your bedroom and bathroom) on an as needed basis. Your caregiver/other person should wait as long as possible (at least several hours) and wear a mask before entering, cleaning, and disinfecting shared spaces that you use. Clean and disinfect areas that may have blood, stool, or body fluids on them. Use household cleaners and disinfectants. Clean visible dirty surfaces with household cleaners containing soap or detergent. Then, use a household disinfectant. Use a product from [employer] List N: Disinfectants for Coronavirus (YQIHK-74). Be sure to follow the instructions on the label to ensure  safe and effective use of the product. Many products recommend keeping the surface wet with a disinfectant for a certain period of time (look at "contact time" on the product label). You may also need to wear personal protective equipment, such as gloves, depending on the directions on the product label. Immediately after disinfecting, wash your hands with soap and water for 20 seconds. For completed guidance on cleaning and disinfecting your home, visit Complete Disinfection Guidance. Take steps to improve ventilation at home Improve ventilation (air flow) at home to help prevent from spreading COVID-19 to other people in your household. Clear out COVID-19 virus particles in the air by opening windows, using air filters, and turning on fans in your home. Use this interactive tool to learn how to improve air flow in your home. When you can be around others after being sick with COVID-19 Deciding when you can be around others is different for different situations. Find out when you can safely end home isolation. For any additional questions about your care, contact your healthcare provider or state or local health department. 08/09/2020 Content source: [facility] ([facility]), Division of Viral Diseases This information is not intended to replace advice given to you by your health care provider. Make sure you discuss any questions you have with your health care provider. Document Revised: 01/27/2021 Document Reviewed: 01/27/2021 Elsevier Patient Education  [employer]  Use "WARM" strategy.  W= write it down  A= associate it  R= repeat it  M= make a mental note  2.   You can keep a Social worker.  Use a 3-ring notebook with sections for the following: calendar, important names and phone numbers,  medications, doctors' names/phone numbers, lists/reminders, and a section to journal what you did  each day.   3.     Use a calendar to write appointments down.  4.    Write yourself a schedule for the day.  This can be placed on the calendar or in a separate section of the Memory Notebook.  Keeping a  regular schedule can help memory.  5.    Use medication organizer  with sections for each day or morning/evening pills.  You may need help loading it  6.    Keep a basket, or pegboard by the door.  Place items that you need to take out with you in the basket or on the pegboard.  You may also want to  include a message board for reminders.  7.    Use sticky notes.  Place sticky notes with reminders in a place where the task is performed.  For example: " turn off the  stove" placed by the stove, "lock the door" placed on the door at eye level, " take your medications" on  the bathroom mirror or by the place where you normally take your medications.  8.    Use alarms/timers.  Use while cooking to remind yourself to check on food or as a reminder to take your medicine, or as a  reminder to make a call, or as a reminder to perform another task, etc.  Management of Memory Problems  There are some general things you can do to help manage your memory problems.  Your memory may not in fact recover, but by using techniques and strategies you will be able to manage your memory difficulties better.  1)  Establish a routine. Try to establish and then stick to a regular routine.  By doing this, you will get used to what to expect and you will reduce the need to rely on your memory.  Also, try to do things at the same time of day, such as taking your medication or checking your calendar first thing in the morning. Think about think that you can do as a part of a regular routine and make a list.  Then enter them into a daily planner to remind you.  This will help you establish a routine.  2)  Organize your environment. Organize your environment so that it is uncluttered.  Decrease visual stimulation.  Place everyday items  such as keys or cell phone in the same place every day (ie.  Basket next to front door) Use post it notes with a brief message to yourself (ie. Turn off light, lock the door) Use labels to indicate where things go (ie. Which cupboards are for food, dishes, etc.) Keep a notepad and pen by the telephone to take messages  3)  Memory Aids A diary or journal/notebook/daily planner Making a list (shopping list, chore list, to do list that needs to be done) Using an alarm as a reminder (kitchen timer or cell phone alarm) Using cell phone to store information (Notes, Calendar, Reminders) Calendar/White board placed in a prominent position Post-it notes  In order for memory aids to be useful, you need to have good habits.  It's no good remembering to make a note in your journal if you don't remember to look in it.  Try setting aside a certain time of day to look in journal.  4)  Improving mood and managing fatigue. There may be other factors that contribute to memory difficulties.  Factors, such as anxiety, depression and tiredness can affect memory. Regular gentle exercise can help improve your mood and give you more energy. Simple relaxation techniques may help relieve symptoms of anxiety Try to get back to completing activities or hobbies you enjoyed doing in the past. Learn to pace yourself through activities to decrease fatigue. Find out about some local support groups where you can share experiences with others. Try and achieve 7-8 hours of sleep at night.

## 2021-04-26 NOTE — Telephone Encounter (Signed)
Notified patient via voicemail. 

## 2021-04-26 NOTE — Chronic Care Management (AMB) (Deleted)
Chronic Care Management   CCM RN Visit Note  04/26/2021 Name: Angelica [name] MRN: [id number] DOB: 1940-07-16  Subjective: Angelica [name] is a 80 y.o. year old female who is a primary care patient of [name], MD. The care management team was consulted for assistance with disease management and care coordination needs.    {CCMTELEPHONEFACETOFACE:21091510} for {CCMINITIALFOLLOWUPCHOICE:21091511} in response to provider referral for case management and/or care coordination services.   Consent to Services:  {CCMCONSENTOPTIONS:25074}  Patient agreed to services and verbal consent obtained.   Assessment: Review of patient past medical history, allergies, medications, health status, including review of consultants reports, laboratory and other test data, was performed as part of comprehensive evaluation and provision of chronic care management services.   SDOH (Social Determinants of Health) assessments and interventions performed:  SDOH Interventions    Flowsheet Row Most Recent Value  SDOH Interventions   Food Insecurity Interventions Intervention Not Indicated  Housing Interventions Intervention Not Indicated  Transportation Interventions Intervention Not Indicated  [Family continues to provide transportation]        CCM Care Plan  Allergies  Allergen Reactions   Asa Buff (Mag [Buffered Aspirin] Other (See Comments)    Excessive sweating   Ensure [Nutritional Supplements]     Or boost - upset stomach    Fish Allergy Other (See Comments)    Unknown reaction, but allergic   Other Other (See Comments)    Gelcaps; gel-containing capsules; extended-release medication - upset stomach    Peanut-Containing Drug Products Other (See Comments)    From allergy test   Penicillins Itching    Has patient had a PCN reaction causing immediate rash, facial/tongue/throat swelling, SOB or lightheadedness with hypotension: No Has patient had a PCN reaction causing severe rash involving  mucus membranes or skin necrosis: No Has patient had a PCN reaction that required hospitalization No Has patient had a PCN reaction occurring within the last 10 years: No If all of the above answers are "NO", then may proceed with Cephalosporin use.   Shellfish-Derived Products Other (See Comments)    From allergy test   Strawberry Extract Other (See Comments)    From allergy test     Outpatient Encounter Medications as of 04/26/2021  Medication Sig   acetaminophen (TYLENOL) 325 MG tablet Take 1 tablet (325 mg total) by mouth every 6 (six) hours as needed for headache or mild pain.   alum & mag hydroxide-simeth (MAALOX/MYLANTA) 200-200-20 MG/5ML suspension Take 15 mLs by mouth every 6 (six) hours as needed for indigestion or heartburn.   amLODipine (NORVASC) 5 MG tablet Take 1 tablet (5 mg total) by mouth daily.   Benralizumab (FASENRA PEN) 30 MG/ML SOAJ Inject into the skin every 8 (eight) weeks.   benzonatate (TESSALON PERLES) 100 MG capsule 1-2 tabs by mouth every 8 hrs as needed for cough   budesonide-formoterol (SYMBICORT) 80-4.5 MCG/ACT inhaler Inhale 2 puffs into the lungs 2 (two) times daily.   Cholecalciferol (VITAMIN D3) 50 MCG (2000 UT) TABS Take 2,000 Units by mouth daily.   diclofenac Sodium (VOLTAREN) 1 % GEL Apply 4 g topically 4 (four) times daily.   EPINEPHrine (EPIPEN 2-PAK) 0.3 mg/0.3 mL IJ SOAJ injection Use as directed for severe allergic reaction   ipratropium (ATROVENT) 0.06 % nasal spray Place 2 sprays into both nostrils 4 (four) times daily as needed (nasal drainage).   linaclotide (LINZESS) 72 MCG capsule TAKE 1 CAPSULE BY MOUTH ONCE DAILY AS NEEDED   loratadine (CLARITIN) 10 MG  tablet Take 10 mg by mouth daily.   Magnesium Hydroxide (PHILLIPS MILK OF MAGNESIA PO) Take 15-30 mLs by mouth daily as needed (for constipation).   molnupiravir EUA (LAGEVRIO) 200 mg CAPS capsule Take 4 capsules (800 mg total) by mouth 2 (two) times daily for 5 days.   montelukast  (SINGULAIR) 10 MG tablet Take 1 tablet (10 mg total) by mouth at bedtime.   nirmatrelvir/ritonavir EUA (PAXLOVID) 20 x 150 MG & 10 x $Re'100MG'XoI$  TABS Take 3 tablets by mouth 2 (two) times daily for 5 days. (Take nirmatrelvir 150 mg two tablets twice daily for 5 days and ritonavir 100 mg one tablet twice daily for 5 days) Patient GFR is > 60   ondansetron (ZOFRAN) 8 MG tablet Take 1 tablet (8 mg total) by mouth every 8 (eight) hours as needed for nausea or vomiting.   polyethylene glycol (MIRALAX / GLYCOLAX) 17 g packet Take 17 g by mouth daily as needed for mild constipation.   pravastatin (PRAVACHOL) 80 MG tablet Take 1 tablet (80 mg total) by mouth daily.   vitamin B-12 (CYANOCOBALAMIN) 1000 MCG tablet Take 1,000 mcg by mouth daily.   Facility-Administered Encounter Medications as of 04/26/2021  Medication   Benralizumab SOSY 30 mg    Patient Active Problem List   Diagnosis Date Noted   COVID-19 04/25/2021   Debility 02/01/2021   Gait disorder 02/01/2021   Malignant neoplasm of bronchus and lung ([place])    Port-A-Cath in place 11/02/2020   Adenocarcinoma of left lung, stage 3 ([place]) 06/22/2020   Lung mass    Pulmonary nodule 05/17/2020   Iron deficiency 05/03/2020   Swelling of lower extremity 07/30/2019   Ganglion cyst of right foot 01/16/2019   HLD (hyperlipidemia) 01/16/2019   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 pylori infection    Other allergic rhinitis 03/20/2016   Malnutrition of moderate degree 04/11/2015   HTN (hypertension) 04/10/2015    Conditions to be addressed/monitored:{CCM ASSESSMENT DZ OPTIONS:25047}  Care Plan : RN Care Manager Plan of Care  Updates made by Knox Royalty, RN since 04/26/2021 12:00 AM      Problem: Chronic Disease Management Needs   Priority: Medium     Long-Range Goal: Development of plan of care for long term chronic disease management   Start Date: 12/26/2020  Expected End Date: 12/26/2021  Priority: Medium  Note:   Current Barriers:  Chronic Disease Management support and education needs related to HTN and lung cancer New COVID positive test 04/25/21  RNCM Clinical Goal(s):  Patient will demonstrate ongoing adherence to prescribed treatment plan for HTN and cancer as evidenced by adherence to prescribed medication regimen contacting provider for new or worsened symptoms or questions attending all scheduled provider appointments, monitoring blood pressure at home weekly  through collaboration with RN Care manager, provider, and care team.   Interventions: 1:1 collaboration with primary care provider regarding development and update of comprehensive plan of care as evidenced by provider attestation and co-signature Inter-disciplinary care team collaboration (see longitudinal plan of care) Evaluation of current treatment plan related to  self management and patient's adherence to plan as established by provider; Discussed with caregiver patient's current clinical condition in light of newly diagnosed COVID-positive tests this week: reports patient "doing well overall, mainly has  stuffy nose, no fever;" confirmed caregiver was able to ensure that patient is taking prescribed molnupiravir-- explained that Dr. Jenny Reichmann had also called in prescription for paxlovid, after receiving message that patient could not swallow  molnupiravir-- daughter reports she was able to swallow medication this morning and has started taking- verbalizes plans to ensure that patient takes all prescribed doses as instructed; for now she states she will hold off on filling paxlovid prescription Confirmed COVID vaccine status: patient had both doses of initial vaccine and one booster vaccine: stated that Dr.  Jenny Reichmann told them to wait to obtain additional vaccines until after first of year when new annual vaccine is available; confirmed patient has not obtained additional COVID booster since her first booster (daughter can not remember exactly when patient received initial booster: daughter is at work and does not have these documents available Confirmed daughter/ caregiver taking infection prevention measures at home as per baseline Confirmed daughter understands to contact PCP office for any changes or new clinical concerns  Hypertension: (Status: Goal on Track (progressing): YES.) Last practice recorded BP readings:  BP Readings from Last 3 Encounters:  11/30/20 (!) 149/84  11/14/20 138/76  11/08/20 135/72  Most recent eGFR/CrCl: No results found for: EGFR  No components found for: CRCL  Evaluation of current treatment plan related to hypertension self management and patient's adherence to plan as established by provider;   Reviewed prescribed diet low salt, heart healthy Discussed plans with patient for ongoing care management follow up and provided patient with direct contact information for care management team; Assessed social determinant of health barriers;  Confirmed no medication changes/ concerns; daughter continues managing/ supervising medications, uses pill box Confirmed caregiver continues to monitor/ record weekly blood pressures- she does not have specific values to review, is at work, reports "all seem fine, no concerns" Confirmed patient has been attending outpatient rehabilitation sessions- reports "they were about to be completed," when patient was diagnosed with COVID: she plans to resume last visits once patient recovers/ recuperates from COVID Confirmed no new/ recent falls- patient "steady on feet,"continues to not use assistive devices  Lung Cancer  (Status: Goal on Track (progressing): YES.) Evaluation of current treatment plan related to  lung cancer ,  self-management and  patient's adherence to plan as established by provider. Discussed plans with patient for ongoing care management follow up and provided patient with direct contact information for care management team Confirmed patient attended pulmonary office visit 10/27 Reviewed scheduled/upcoming provider appointments including: 05/01/21 and 05/10/21: port flushes; 05/03/21- oncology provider: daughter appropriately reports she may need to cancel these appointments, depending on patient's progress in recuperation from current COVID infection  Patient Goals/Self-Care Activities: Patient Naydene/ [place] will ensure that patient will: Take medications as prescribed Attend all scheduled provider appointments Call pharmacy for medication refills Call provider office for new concerns or questions Continue to monitor and write down on paper weekly blood pressures at home Continue to follow low-salt heart healthy diet    Continue to stay as active as possible Continue to take efforts to prevent falls      Plan:{CM FOLLOW UP PLAN:25073} SIG***

## 2021-04-26 NOTE — Chronic Care Management (AMB) (Signed)
Chronic Care Management   CCM RN Visit Note  04/26/2021 Name: Angelica [name] MRN: [id number] DOB: 09/16/1940  Subjective: Angelica [name] is a 80 y.o. year old female who is a primary care patient of [name], MD. The care management team was consulted for assistance with disease management and care coordination needs.    Engaged with patient's daughter/ caregiver Angelica [name], on [facility] D[facility] by telephone for follow up visit in response to provider referral for case management and/or care coordination services.   Consent to Services:  The patient was given information about Chronic Care Management services, agreed to services, and gave verbal consent prior to initiation of services.  Please see initial visit note for detailed documentation.  Patient agreed to services and verbal consent obtained.   Assessment: Review of patient past medical history, allergies, medications, health status, including review of consultants reports, laboratory and other test data, was performed as part of comprehensive evaluation and provision of chronic care management services.   SDOH (Social Determinants of Health) assessments and interventions performed:  SDOH Interventions    Flowsheet Row Most Recent Value  SDOH Interventions   Food Insecurity Interventions Intervention Not Indicated  Housing Interventions Intervention Not Indicated  Transportation Interventions Intervention Not Indicated  [Family continues to provide transportation]      CCM Care Plan Allergies  Allergen Reactions   Asa Buff (Mag [Buffered Aspirin] Other (See Comments)    Excessive sweating   Ensure [Nutritional Supplements]     Or boost - upset stomach    Fish Allergy Other (See Comments)    Unknown reaction, but allergic   Other Other (See Comments)    Gelcaps; gel-containing capsules; extended-release medication - upset stomach    Peanut-Containing Drug Products Other (See Comments)    From allergy test   Penicillins  Itching    Has patient had a PCN reaction causing immediate rash, facial/tongue/throat swelling, SOB or lightheadedness with hypotension: No Has patient had a PCN reaction causing severe rash involving mucus membranes or skin necrosis: No Has patient had a PCN reaction that required hospitalization No Has patient had a PCN reaction occurring within the last 10 years: No If all of the above answers are "NO", then may proceed with Cephalosporin use.   Shellfish-Derived Products Other (See Comments)    From allergy test   Strawberry Extract Other (See Comments)    From allergy test    Outpatient Encounter Medications as of 04/26/2021  Medication Sig   acetaminophen (TYLENOL) 325 MG tablet Take 1 tablet (325 mg total) by mouth every 6 (six) hours as needed for headache or mild pain.   alum & mag hydroxide-simeth (MAALOX/MYLANTA) 200-200-20 MG/5ML suspension Take 15 mLs by mouth every 6 (six) hours as needed for indigestion or heartburn.   amLODipine (NORVASC) 5 MG tablet Take 1 tablet (5 mg total) by mouth daily.   Benralizumab (FASENRA PEN) 30 MG/ML SOAJ Inject into the skin every 8 (eight) weeks.   benzonatate (TESSALON PERLES) 100 MG capsule 1-2 tabs by mouth every 8 hrs as needed for cough   budesonide-formoterol (SYMBICORT) 80-4.5 MCG/ACT inhaler Inhale 2 puffs into the lungs 2 (two) times daily.   Cholecalciferol (VITAMIN D3) 50 MCG (2000 UT) TABS Take 2,000 Units by mouth daily.   diclofenac Sodium (VOLTAREN) 1 % GEL Apply 4 g topically 4 (four) times daily.   EPINEPHrine (EPIPEN 2-PAK) 0.3 mg/0.3 mL IJ SOAJ injection Use as directed for severe allergic reaction   ipratropium (ATROVENT) 0.06 %  nasal spray Place 2 sprays into both nostrils 4 (four) times daily as needed (nasal drainage).   linaclotide (LINZESS) 72 MCG capsule TAKE 1 CAPSULE BY MOUTH ONCE DAILY AS NEEDED   loratadine (CLARITIN) 10 MG tablet Take 10 mg by mouth daily.   Magnesium Hydroxide (PHILLIPS MILK OF MAGNESIA PO) Take  15-30 mLs by mouth daily as needed (for constipation).   molnupiravir EUA (LAGEVRIO) 200 mg CAPS capsule Take 4 capsules (800 mg total) by mouth 2 (two) times daily for 5 days.   montelukast (SINGULAIR) 10 MG tablet Take 1 tablet (10 mg total) by mouth at bedtime.   nirmatrelvir/ritonavir EUA (PAXLOVID) 20 x 150 MG & 10 x $Re'100MG'jPg$  TABS Take 3 tablets by mouth 2 (two) times daily for 5 days. (Take nirmatrelvir 150 mg two tablets twice daily for 5 days and ritonavir 100 mg one tablet twice daily for 5 days) Patient GFR is > 60   ondansetron (ZOFRAN) 8 MG tablet Take 1 tablet (8 mg total) by mouth every 8 (eight) hours as needed for nausea or vomiting.   polyethylene glycol (MIRALAX / GLYCOLAX) 17 g packet Take 17 g by mouth daily as needed for mild constipation.   pravastatin (PRAVACHOL) 80 MG tablet Take 1 tablet (80 mg total) by mouth daily.   vitamin B-12 (CYANOCOBALAMIN) 1000 MCG tablet Take 1,000 mcg by mouth daily.   Facility-Administered Encounter Medications as of 04/26/2021  Medication   Benralizumab SOSY 30 mg   Patient Active Problem List   Diagnosis Date Noted   COVID-19 04/25/2021   Debility 02/01/2021   Gait disorder 02/01/2021   Malignant neoplasm of bronchus and lung ([place])    Port-A-Cath in place 11/02/2020   Adenocarcinoma of left lung, stage 3 ([place]) 06/22/2020   Lung mass    Pulmonary nodule 05/17/2020   Iron deficiency 05/03/2020   Swelling of lower extremity 07/30/2019   Ganglion cyst of right foot 01/16/2019   HLD (hyperlipidemia) 01/16/2019   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 pylori infection    Other allergic rhinitis 03/20/2016   Malnutrition of moderate degree 04/11/2015   HTN (hypertension) 04/10/2015    Conditions to be addressed/monitored:  HTN, lung CA, new COVID-positive test  Care Plan : RN Care Manager Plan of Care  Updates made by Knox Royalty, RN since 04/26/2021 12:00 AM     Problem: Chronic Disease Management Needs   Priority: Medium     Long-Range Goal: Development of plan of care for long term chronic disease management   Start Date: 12/26/2020  Expected End Date: 12/26/2021  Priority: Medium  Note:   Current Barriers:  Chronic Disease Management support and education needs related to HTN and lung cancer New COVID positive test 04/25/21  RNCM Clinical Goal(s):  Patient will demonstrate ongoing adherence to prescribed treatment plan for HTN and cancer as evidenced by adherence to prescribed medication regimen contacting provider for new or worsened symptoms or questions attending all scheduled provider appointments, monitoring blood pressure at home weekly  through collaboration with RN Care manager, provider, and care team.   Interventions: 1:1 collaboration with primary care provider regarding development and update of comprehensive plan of care as evidenced by provider attestation and co-signature Inter-disciplinary care team collaboration (see longitudinal plan of care) Evaluation of  current treatment plan related to  self management and patient's adherence to plan as established by provider; Discussed with caregiver patient's current clinical condition in light of newly diagnosed COVID-positive tests this week: reports patient "doing well overall, mainly has stuffy nose, no fever;" confirmed caregiver was able to ensure that patient is taking prescribed molnupiravir-- explained that Dr. Jenny Reichmann had also called in prescription for paxlovid, after receiving message that patient could not swallow  molnupiravir-- daughter reports she was able to swallow medication this morning and has started taking- verbalizes plans to ensure that patient takes all prescribed doses as  instructed; for now she states she will hold off on filling paxlovid prescription Confirmed COVID vaccine status: patient had both doses of initial vaccine and one booster vaccine: stated that Dr. Jenny Reichmann told them to wait to obtain additional vaccines until after first of year when new annual vaccine is available; confirmed patient has not obtained additional COVID booster since her first booster (daughter can not remember exactly when patient received initial booster: daughter is at work and does not have these documents available Confirmed daughter/ caregiver taking infection prevention measures at home as per baseline Confirmed daughter understands to contact PCP office for any changes or new clinical concerns  Hypertension: (Status: Goal on Track (progressing): YES.) Last practice recorded BP readings:  BP Readings from Last 3 Encounters:  11/30/20 (!) 149/84  11/14/20 138/76  11/08/20 135/72  Most recent eGFR/CrCl: No results found for: EGFR  No components found for: CRCL  Evaluation of current treatment plan related to hypertension self management and patient's adherence to plan as established by provider;   Reviewed prescribed diet low salt, heart healthy Discussed plans with patient for ongoing care management follow up and provided patient with direct contact information for care management team; Assessed social determinant of health barriers;  Confirmed no medication changes/ concerns; daughter continues managing/ supervising medications, uses pill box Confirmed caregiver continues to monitor/ record weekly blood pressures- she does not have specific values to review, is at work, reports "all seem fine, no concerns" Confirmed patient has been attending outpatient rehabilitation sessions- reports "they were about to be completed," when patient was diagnosed with COVID: she plans to resume last visits once patient recovers/ recuperates from COVID Confirmed no new/ recent falls- patient  "steady on feet,"continues to not use assistive devices Discussed with caregiver her reported concern that patient's memory is declining: she verbalizes plans to discuss with PCP- this was encouraged  Lung Cancer  (Status: Goal on Track (progressing): YES.) Evaluation of current treatment plan related to  lung cancer ,  self-management and patient's adherence to plan as established by provider. Discussed plans with patient for ongoing care management follow up and provided patient with direct contact information for care management team Confirmed patient attended pulmonary office visit 10/27 Reviewed scheduled/upcoming provider appointments including: 05/01/21 and 05/10/21: port flushes; 05/03/21- oncology provider: daughter appropriately reports she may need to cancel these appointments, depending on patient's progress in recuperation from current COVID infection  Patient Goals/Self-Care Activities: Patient Syriana/ [place] will ensure that patient will: Take medications as prescribed Attend all scheduled provider appointments Call pharmacy for medication refills Call provider office for new concerns or questions Continue to monitor and write down on paper weekly blood pressures at home Continue to follow low-salt heart healthy diet    Continue to stay as active as possible Continue to take efforts to prevent falls     Plan: Telephone follow up appointment with care management  team member scheduled for:  Wednesday, May 31, 2021 at 9:00 am The patient has been provided with contact information for the care management team and has been advised to call with any health related questions or concerns   [name], RN, BSN, [place] [phone]: direct office

## 2021-05-01 ENCOUNTER — Inpatient Hospital Stay: Payer: Medicare HMO

## 2021-05-01 ENCOUNTER — Other Ambulatory Visit: Payer: Medicare HMO

## 2021-05-01 DIAGNOSIS — R430 Anosmia: Secondary | ICD-10-CM | POA: Insufficient documentation

## 2021-05-01 DIAGNOSIS — J0101 Acute recurrent maxillary sinusitis: Secondary | ICD-10-CM | POA: Diagnosis not present

## 2021-05-01 DIAGNOSIS — J322 Chronic ethmoidal sinusitis: Secondary | ICD-10-CM | POA: Diagnosis not present

## 2021-05-01 DIAGNOSIS — J339 Nasal polyp, unspecified: Secondary | ICD-10-CM | POA: Diagnosis not present

## 2021-05-02 ENCOUNTER — Ambulatory Visit: Payer: Medicare HMO | Admitting: Physical Therapy

## 2021-05-03 ENCOUNTER — Inpatient Hospital Stay: Payer: Medicare HMO | Admitting: Internal Medicine

## 2021-05-10 ENCOUNTER — Inpatient Hospital Stay: Payer: Medicare HMO

## 2021-05-11 ENCOUNTER — Other Ambulatory Visit: Payer: Self-pay

## 2021-05-11 ENCOUNTER — Ambulatory Visit
Admission: RE | Admit: 2021-05-11 | Discharge: 2021-05-11 | Disposition: A | Discharge status: Home / Self Care | Payer: Medicare HMO | Source: Ambulatory Visit | Attending: Internal Medicine | Admitting: Internal Medicine

## 2021-05-11 ENCOUNTER — Other Ambulatory Visit: Payer: Self-pay | Admitting: *Deleted

## 2021-05-11 ENCOUNTER — Inpatient Hospital Stay: Payer: Medicare HMO | Attending: Internal Medicine

## 2021-05-11 DIAGNOSIS — Z79899 Other long term (current) drug therapy: Secondary | ICD-10-CM | POA: Diagnosis not present

## 2021-05-11 DIAGNOSIS — Z90710 Acquired absence of both cervix and uterus: Secondary | ICD-10-CM | POA: Insufficient documentation

## 2021-05-11 DIAGNOSIS — C3490 Malignant neoplasm of unspecified part of unspecified bronchus or lung: Secondary | ICD-10-CM

## 2021-05-11 DIAGNOSIS — C3492 Malignant neoplasm of unspecified part of left bronchus or lung: Secondary | ICD-10-CM | POA: Diagnosis not present

## 2021-05-11 DIAGNOSIS — I700 Atherosclerosis of aorta: Secondary | ICD-10-CM | POA: Diagnosis not present

## 2021-05-11 DIAGNOSIS — I2510 Atherosclerotic heart disease of native coronary artery without angina pectoris: Secondary | ICD-10-CM | POA: Diagnosis not present

## 2021-05-11 DIAGNOSIS — Z95828 Presence of other vascular implants and grafts: Secondary | ICD-10-CM

## 2021-05-11 DIAGNOSIS — C3432 Malignant neoplasm of lower lobe, left bronchus or lung: Secondary | ICD-10-CM | POA: Insufficient documentation

## 2021-05-11 DIAGNOSIS — I10 Essential (primary) hypertension: Secondary | ICD-10-CM | POA: Diagnosis not present

## 2021-05-11 DIAGNOSIS — J432 Centrilobular emphysema: Secondary | ICD-10-CM | POA: Diagnosis not present

## 2021-05-11 LAB — CMP (CANCER CENTER ONLY)
ALT: 24 U/L (ref 0–44)
AST: 27 U/L (ref 15–41)
Albumin: 4 g/dL (ref 3.5–5.0)
Alkaline Phosphatase: 79 U/L (ref 38–126)
Anion gap: 8 (ref 5–15)
BUN: 16 mg/dL (ref 8–23)
CO2: 27 mmol/L (ref 22–32)
Calcium: 9.3 mg/dL (ref 8.9–10.3)
Chloride: 103 mmol/L (ref 98–111)
Creatinine: 0.9 mg/dL (ref 0.44–1.00)
GFR, Estimated: >60 mL/min (ref >60)
Glucose, Bld: 92 mg/dL (ref 70–99)
Potassium: 3.8 mmol/L (ref 3.5–5.1)
Sodium: 138 mmol/L (ref 135–145)
Total Bilirubin: 0.5 mg/dL (ref 0.3–1.2)
Total Protein: 7.5 g/dL (ref 6.5–8.1)

## 2021-05-11 LAB — CBC WITH DIFFERENTIAL (CANCER CENTER ONLY)
Abs Immature Granulocytes: 0.05 10*3/uL (ref 0.00–0.07)
Basophils Absolute: 0.1 10*3/uL (ref 0.0–0.1)
Basophils Relative: 1 %
Eosinophils Absolute: 0 10*3/uL (ref 0.0–0.5)
Eosinophils Relative: 0 %
HCT: 42.3 % (ref 36.0–46.0)
Hemoglobin: 14.4 g/dL (ref 12.0–15.0)
Immature Granulocytes: 1 %
Lymphocytes Relative: 26 %
Lymphs Abs: 2.5 10*3/uL (ref 0.7–4.0)
MCH: 29.7 pg (ref 26.0–34.0)
MCHC: 34 g/dL (ref 30.0–36.0)
MCV: 87.2 fL (ref 80.0–100.0)
Monocytes Absolute: 0.8 10*3/uL (ref 0.1–1.0)
Monocytes Relative: 8 %
Neutro Abs: 6.5 10*3/uL (ref 1.7–7.7)
Neutrophils Relative %: 64 %
Platelet Count: 220 10*3/uL (ref 150–400)
RBC: 4.85 MIL/uL (ref 3.87–5.11)
RDW: 14.1 % (ref 11.5–15.5)
WBC Count: 9.9 10*3/uL (ref 4.0–10.5)
nRBC: 0 % (ref 0.0–0.2)

## 2021-05-11 LAB — TSH: TSH: 9.516 u[IU]/mL — ABNORMAL HIGH (ref 0.308–3.960)

## 2021-05-11 IMAGING — CT CT CHEST W/ CM
1 series · 15 of 34 positions shown, 19 images · IV contrast (APPLIED)
Comparison: [DATE]

CLINICAL DATA: Follow-up non-small cell lung cancer

EXAM:
CT CHEST WITH CONTRAST
TECHNIQUE: Multidetector CT imaging of the chest was performed during
intravenous contrast administration.
CONTRAST:  75mL [KQ] IOPAMIDOL ([KQ]) INJECTION 61%

[Series 2: chest w/cm · axial · 0.63mm/px · z∈[+835,+1127]mm · 15 of 172 slices shown, 19 images]
[im 13/172  mediastinal]
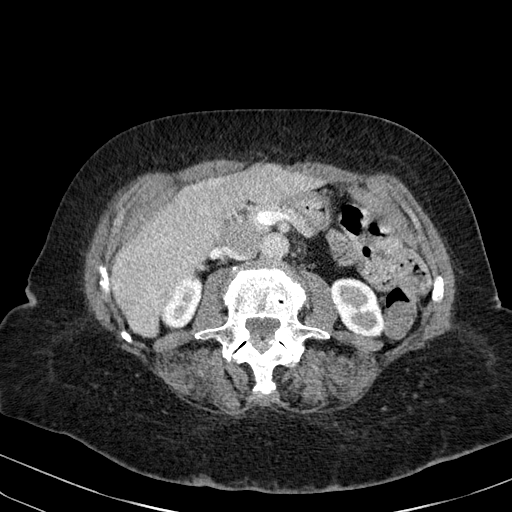
[im 13/172  lung]
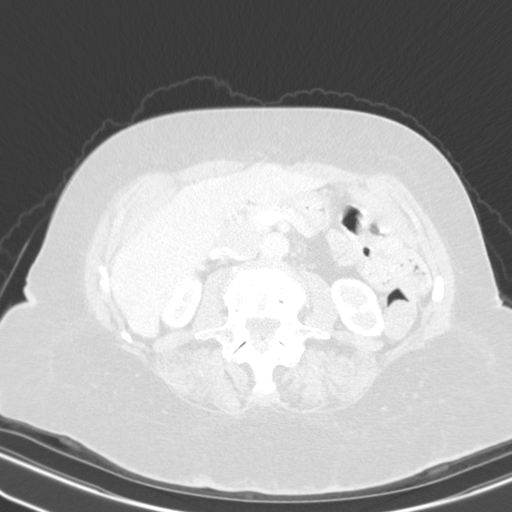
[im 26/172  lung]
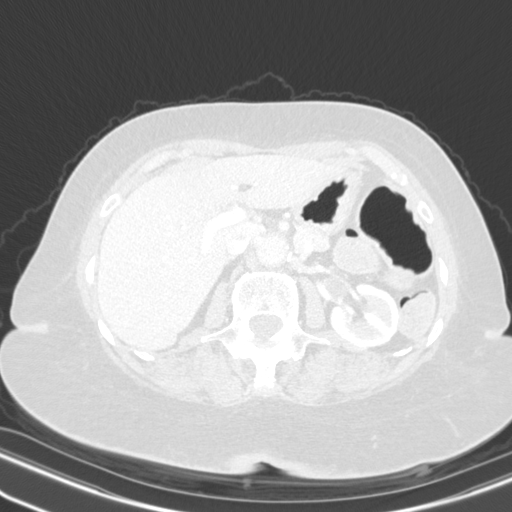
[im 35/172  lung]
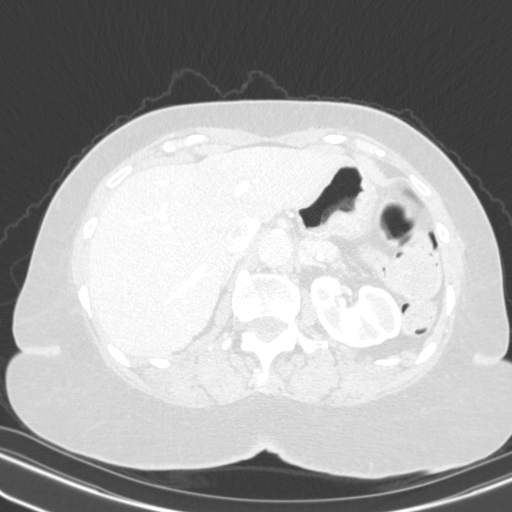
[im 45/172  lung]
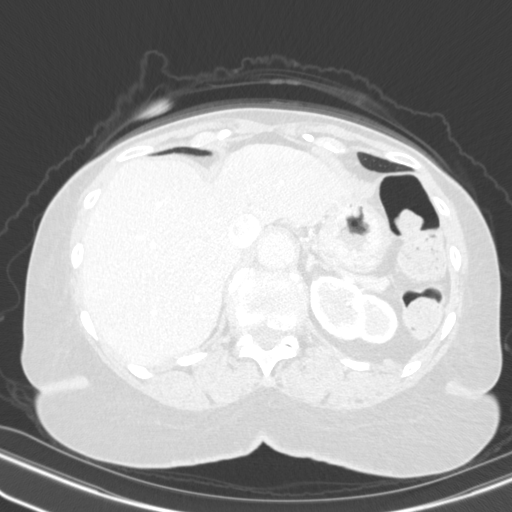
[im 58/172  mediastinal]
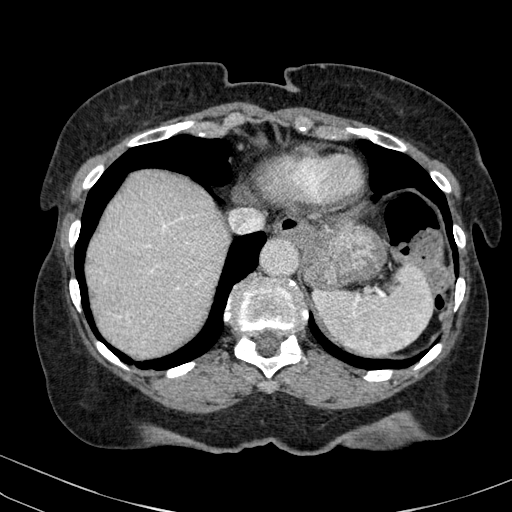
[im 58/172  lung]
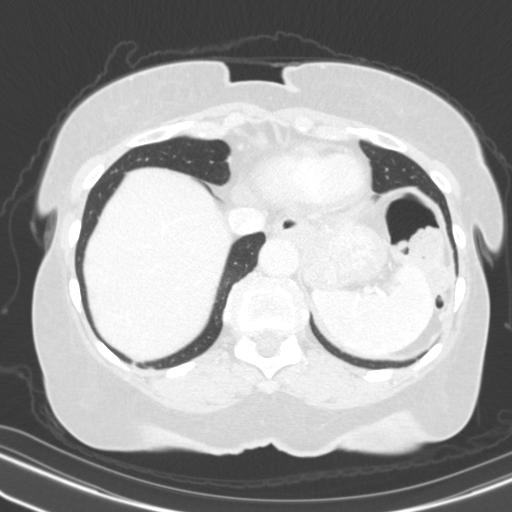
[im 69/172  lung]
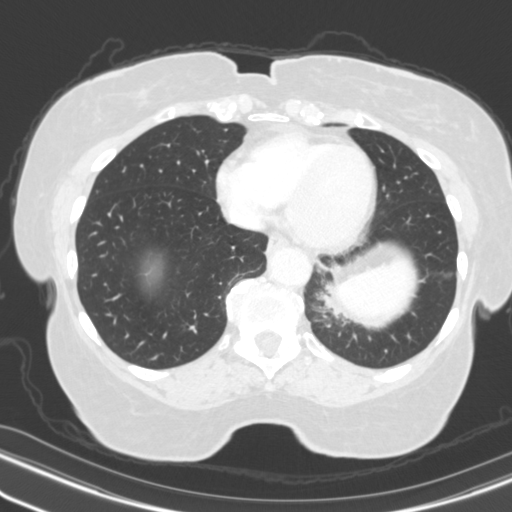
[im 77/172  lung]
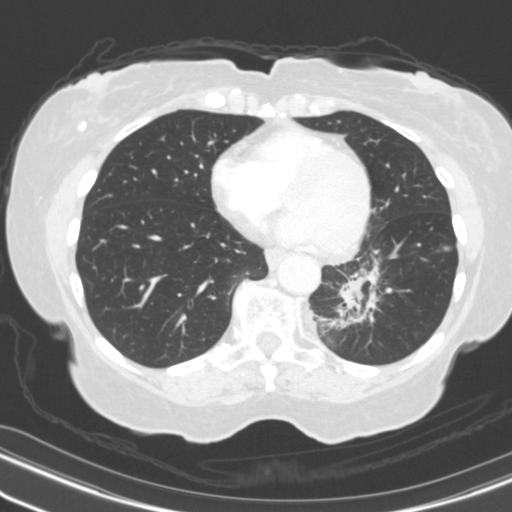
[im 89/172  lung]
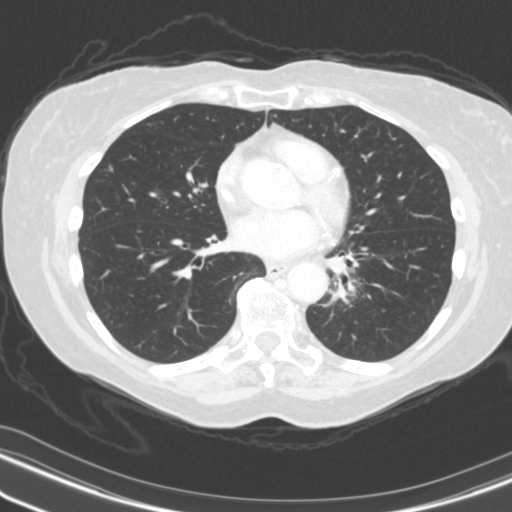
[im 96/172  mediastinal]
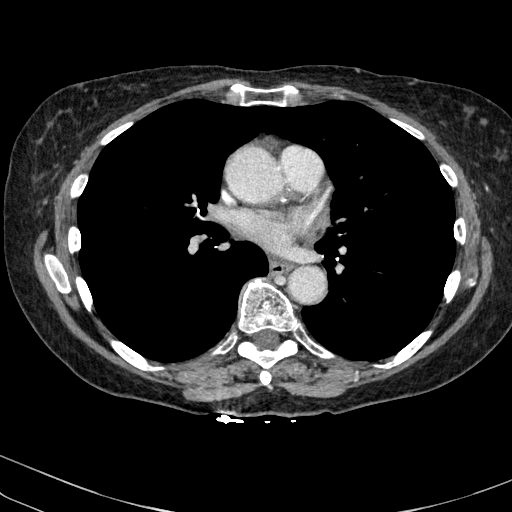
[im 96/172  lung]
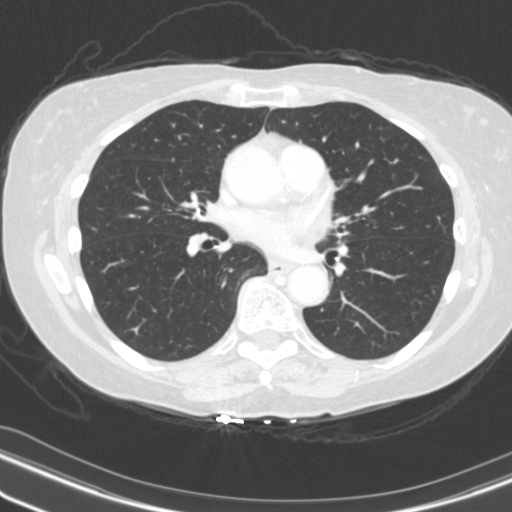
[im 103/172  lung]
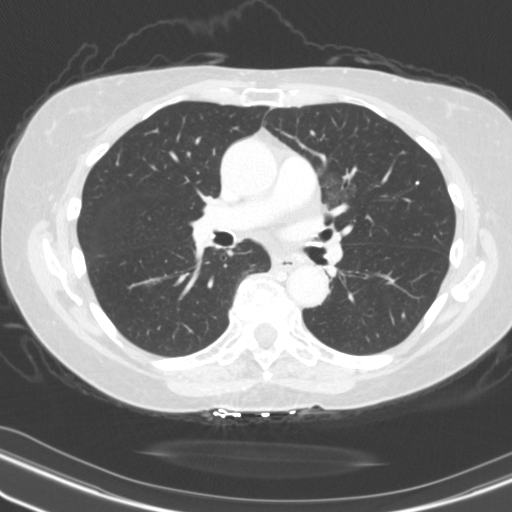
[im 115/172  lung]
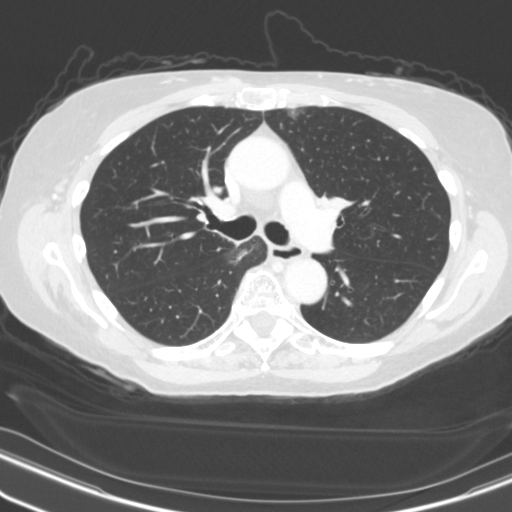
[im 127/172  lung]
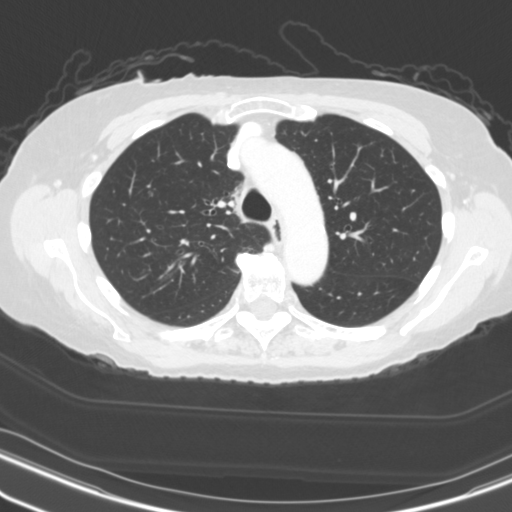
[im 137/172  mediastinal]
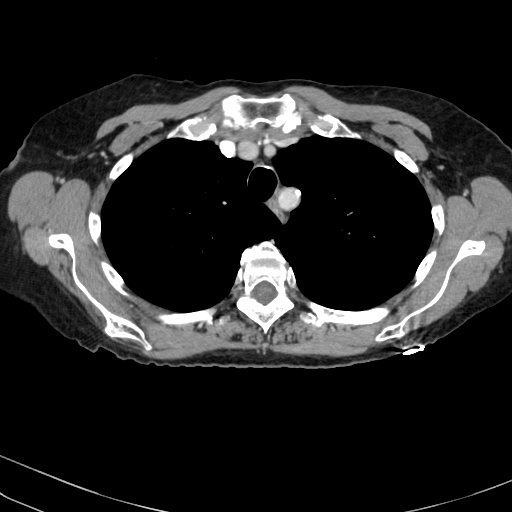
[im 137/172  lung]
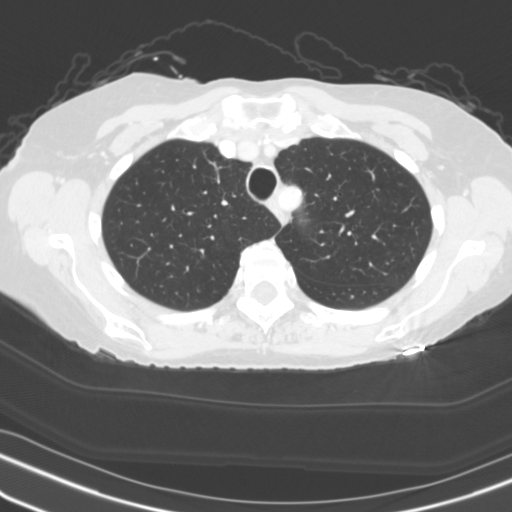
[im 146/172  lung]
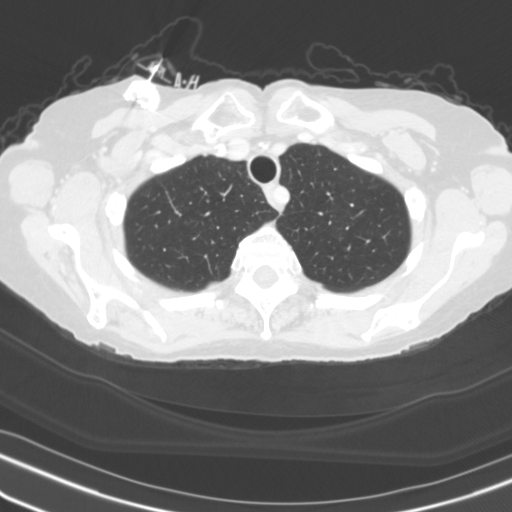
[im 159/172  lung]
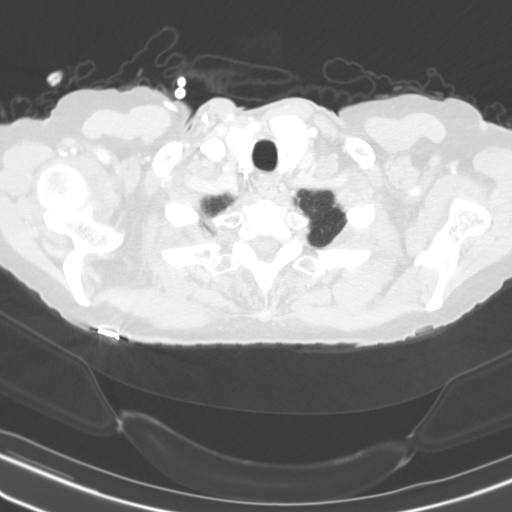

[15 of 34 positions shown; findings below may reference images not displayed]

FINDINGS: Cardiovascular: The heart is normal in size. No pericardial
effusion.

No evidence of thoracic aortic aneurysm. Atherosclerotic
calcifications of the arch.

Three vessel coronary atherosclerosis.

Mediastinum/Nodes: No suspicious mediastinal lymphadenopathy.

Multiple left thyroid nodules measuring up to 18 mm (series 2/image
15), unchanged.

Lungs/Pleura: Radiation changes in the central left lower lobe
(series 5/image 97) with volume loss, stable versus mildly improved.

Additional mild radiation changes in the medial right hemithorax
(for example, series 5/image 59).

2 mm right middle lobe nodule (series 8/image 33), unchanged,
benign. No suspicious pulmonary nodules.

Mild centrilobular emphysematous changes, upper lung predominant.

No pleural effusion or pneumothorax.

Upper Abdomen: Visualized upper abdomen is grossly unremarkable.

Musculoskeletal: Degenerative changes of the visualized
thoracolumbar spine.
IMPRESSION: Radiation changes predominantly in the left lower lobe, stable
versus mildly improved.

No evidence of recurrent or metastatic disease.

Multiple left thyroid nodules measuring up to 18 mm, unchanged.
Recommend thyroid US (ref: [HOSPITAL]. [DATE]):

Aortic Atherosclerosis ([KQ]-[KQ]) and Emphysema ([KQ]-[KQ]).

## 2021-05-11 MED ORDER — HEPARIN SOD (PORK) LOCK FLUSH 100 UNIT/ML IV SOLN
500.0000 [IU] | Freq: Once | INTRAVENOUS | Status: AC
  Administered 2021-05-11: 15:00:00 500 [IU] via INTRAVENOUS

## 2021-05-11 MED ORDER — SODIUM CHLORIDE 0.9% FLUSH: 10.0000 mL | INTRAVENOUS | Status: DC | PRN

## 2021-05-11 MED ORDER — SODIUM CHLORIDE 0.9% FLUSH
10.0000 mL | Freq: Once | INTRAVENOUS | Status: AC
  Administered 2021-05-11: 13:00:00 10 mL

## 2021-05-11 MED ORDER — IOPAMIDOL (ISOVUE-300) INJECTION 61%
75.0000 mL | Freq: Once | INTRAVENOUS | Status: AC | PRN
  Administered 2021-05-11: 15:00:00 75 mL via INTRAVENOUS

## 2021-05-11 NOTE — Progress Notes (Signed)
[facility] called to report that patient was accessed here today prior to CT.  They will deaccess, requesting orders, orders placed.

## 2021-05-16 ENCOUNTER — Inpatient Hospital Stay (HOSPITAL_BASED_OUTPATIENT_CLINIC_OR_DEPARTMENT_OTHER): Payer: Medicare HMO | Admitting: Internal Medicine

## 2021-05-16 ENCOUNTER — Other Ambulatory Visit: Payer: Self-pay

## 2021-05-16 ENCOUNTER — Encounter: Payer: Self-pay | Admitting: Internal Medicine

## 2021-05-16 VITALS — BP 158/84 | HR 75 | Resp 17 | Wt 154.3 lb

## 2021-05-16 DIAGNOSIS — Z79899 Other long term (current) drug therapy: Secondary | ICD-10-CM | POA: Diagnosis not present

## 2021-05-16 DIAGNOSIS — R590 Localized enlarged lymph nodes: Secondary | ICD-10-CM

## 2021-05-16 DIAGNOSIS — C3490 Malignant neoplasm of unspecified part of unspecified bronchus or lung: Secondary | ICD-10-CM

## 2021-05-16 DIAGNOSIS — E032 Hypothyroidism due to medicaments and other exogenous substances: Secondary | ICD-10-CM | POA: Insufficient documentation

## 2021-05-16 DIAGNOSIS — I10 Essential (primary) hypertension: Secondary | ICD-10-CM | POA: Diagnosis not present

## 2021-05-16 DIAGNOSIS — C3432 Malignant neoplasm of lower lobe, left bronchus or lung: Secondary | ICD-10-CM | POA: Diagnosis not present

## 2021-05-16 DIAGNOSIS — C3492 Malignant neoplasm of unspecified part of left bronchus or lung: Secondary | ICD-10-CM

## 2021-05-16 DIAGNOSIS — Z90710 Acquired absence of both cervix and uterus: Secondary | ICD-10-CM | POA: Diagnosis not present

## 2021-05-16 MED ORDER — LEVOTHYROXINE SODIUM 50 MCG PO TABS: 50.0000 ug | ORAL_TABLET | Freq: Every day | ORAL | 2 refills | Status: DC

## 2021-05-16 NOTE — Progress Notes (Signed)
[place] Telephone:(336) [phone]   [phone]  OFFICE PROGRESS NOTE  [name], MD [place] [id number]  DIAGNOSIS: Stage IIIB (T2 a, N3, M0) non-small cell lung cancer, adenocarcinoma presented with left lower lobe lung mass in addition to left hilar and mediastinal as well as right and left cervical lymphadenopathy diagnosed in January 2022  PRIOR THERAPY:  1) Concurrent chemoradiation with weekly carboplatin for AUC of 2 and paclitaxel 45 mg/M2.  Status post 5 cycles.  Last dose was given August 01, 2020. 2) Consolidation treatment with immunotherapy with Imfinzi 1500 mg IV every 4 weeks.  First dose September 06, 2020.  Status post 3 cycles.  This treatment was discontinued secondary to intolerance and hospital admission with sepsis.  CURRENT THERAPY: Observation.  INTERVAL HISTORY: Angelica [name] 80 y.o. female returns to the clinic today for follow-up visit accompanied by her daughter.  The patient is feeling fine today with no concerning complaints except for fatigue.  She denied having any current chest pain, shortness of breath, cough or hemoptysis.  She denied having any nausea, vomiting, diarrhea or constipation.  She has no headache or visual changes.  She has no significant weight loss or night sweats.  The patient had repeat CT scan of the chest performed recently and she is here for evaluation and discussion of her scan results.    MEDICAL HISTORY: Past Medical History:  Diagnosis Date   Arthritis    Asthma    Bowel obstruction (HCC)    Cancer (HCC)    Environmental allergies    GERD (gastroesophageal reflux disease)    Glaucoma 05/09/2017   H. pylori infection    History of radiation therapy 07/05/20-08/05/20   Left lung IMRT Dr. Sondra Come    HLD (hyperlipidemia) 01/16/2019   Hypertension    Iron deficiency anemia    Osteopenia 05/26/2017   Thyroid disease    Urticaria 07/25/2018    ALLERGIES:  is allergic to asa  buff (mag [buffered aspirin], ensure [nutritional supplements], fish allergy, other, peanut-containing drug products, penicillins, shellfish-derived products, and strawberry extract.  MEDICATIONS:  Current Outpatient Medications  Medication Sig Dispense Refill   acetaminophen (TYLENOL) 325 MG tablet Take 1 tablet (325 mg total) by mouth every 6 (six) hours as needed for headache or mild pain.     alum & mag hydroxide-simeth (MAALOX/MYLANTA) 200-200-20 MG/5ML suspension Take 15 mLs by mouth every 6 (six) hours as needed for indigestion or heartburn.     amLODipine (NORVASC) 5 MG tablet Take 1 tablet (5 mg total) by mouth daily. 90 tablet 3   Benralizumab (FASENRA PEN) 30 MG/ML SOAJ Inject into the skin every 8 (eight) weeks.     benzonatate (TESSALON PERLES) 100 MG capsule 1-2 tabs by mouth every 8 hrs as needed for cough 60 capsule 1   budesonide-formoterol (SYMBICORT) 80-4.5 MCG/ACT inhaler Inhale 2 puffs into the lungs 2 (two) times daily. 10.2 g 5   Cholecalciferol (VITAMIN D3) 50 MCG (2000 UT) TABS Take 2,000 Units by mouth daily.     diclofenac Sodium (VOLTAREN) 1 % GEL Apply 4 g topically 4 (four) times daily. 100 g 0   EPINEPHrine (EPIPEN 2-PAK) 0.3 mg/0.3 mL IJ SOAJ injection Use as directed for severe allergic reaction 2 each 1   ipratropium (ATROVENT) 0.06 % nasal spray Place 2 sprays into both nostrils 4 (four) times daily as needed (nasal drainage). 15 mL 5   linaclotide (LINZESS) 72 MCG  capsule TAKE 1 CAPSULE BY MOUTH ONCE DAILY AS NEEDED 30 capsule 0   loratadine (CLARITIN) 10 MG tablet Take 10 mg by mouth daily.     Magnesium Hydroxide (PHILLIPS MILK OF MAGNESIA PO) Take 15-30 mLs by mouth daily as needed (for constipation).     montelukast (SINGULAIR) 10 MG tablet Take 1 tablet (10 mg total) by mouth at bedtime. 30 tablet 5   ondansetron (ZOFRAN) 8 MG tablet Take 1 tablet (8 mg total) by mouth every 8 (eight) hours as needed for nausea or vomiting. 20 tablet 0   polyethylene  glycol (MIRALAX / GLYCOLAX) 17 g packet Take 17 g by mouth daily as needed for mild constipation.     pravastatin (PRAVACHOL) 80 MG tablet Take 1 tablet (80 mg total) by mouth daily. 90 tablet 3   vitamin B-12 (CYANOCOBALAMIN) 1000 MCG tablet Take 1,000 mcg by mouth daily.     Current Facility-Administered Medications  Medication Dose Route Frequency Provider Last Rate Last Admin   Benralizumab SOSY 30 mg  30 mg Subcutaneous Q8 Weeks Kennith Gain, MD   30 mg at 04/11/21 0865    SURGICAL HISTORY:  Past Surgical History:  Procedure Laterality Date   ABDOMINAL HYSTERECTOMY     BOWEL RESECTION N/A 05/23/2020   Procedure: SMALL BOWEL REPAIR;  Surgeon: Kieth Brightly Arta Bruce, MD;  Location: [facility];  Service: General;  Laterality: N/A;   BREAST BIOPSY     BRONCHIAL BIOPSY  06/10/2020   Procedure: BRONCHIAL BIOPSIES;  Surgeon: Garner Nash, DO;  Location: [facility];  Service: Pulmonary;;   BRONCHIAL BRUSHINGS  06/10/2020   Procedure: BRONCHIAL BRUSHINGS;  Surgeon: Garner Nash, DO;  Location: [facility];  Service: Pulmonary;;   BRONCHIAL NEEDLE ASPIRATION BIOPSY  06/10/2020   Procedure: BRONCHIAL NEEDLE ASPIRATION BIOPSIES;  Surgeon: Garner Nash, DO;  Location: [facility];  Service: Pulmonary;;   BRONCHIAL WASHINGS  06/10/2020   Procedure: BRONCHIAL WASHINGS;  Surgeon: Garner Nash, DO;  Location: [facility];  Service: Pulmonary;;   COLON SURGERY     DIAGNOSTIC LARYNGOSCOPY N/A 05/23/2020   Procedure: ATTEMPTED DIAGNOSTIC LAPAROSCOPY WITH ADHESIONS;  Surgeon: Kieth Brightly, Arta Bruce, MD;  Location: [facility];  Service: General;  Laterality: N/A;   IR IMAGING GUIDED PORT INSERTION  07/15/2020   KNEE SURGERY     LAPAROTOMY N/A 04/18/2015   Procedure: Exploratory laparotomy with lysis of adhesions, possible bowel resection;  Surgeon: Ralene Ok, MD;  Location: [facility];  Service: General;  Laterality: N/A;   LAPAROTOMY N/A 05/23/2020   Procedure: EXPLORATORY LAPAROTOMY;   Surgeon: Mickeal Skinner, MD;  Location: [facility];  Service: General;  Laterality: N/A;   LYSIS OF ADHESION N/A 05/23/2020   Procedure: LYSIS OF ADHESION;  Surgeon: Kieth Brightly Arta Bruce, MD;  Location: [facility];  Service: General;  Laterality: N/A;   NASAL SINUS SURGERY     THYROID SURGERY     VIDEO BRONCHOSCOPY WITH ENDOBRONCHIAL NAVIGATION Bilateral 06/10/2020   Procedure: VIDEO BRONCHOSCOPY WITH ENDOBRONCHIAL NAVIGATION;  Surgeon: Garner Nash, DO;  Location: [facility];  Service: Pulmonary;  Laterality: Bilateral;   VIDEO BRONCHOSCOPY WITH ENDOBRONCHIAL ULTRASOUND  06/10/2020   Procedure: VIDEO BRONCHOSCOPY WITH ENDOBRONCHIAL ULTRASOUND;  Surgeon: Garner Nash, DO;  Location: [facility];  Service: Pulmonary;;    REVIEW OF SYSTEMS:  A comprehensive review of systems was negative except for: Constitutional: positive for fatigue   PHYSICAL EXAMINATION: General appearance: alert, cooperative, fatigued, and no distress Head: Normocephalic, without obvious abnormality, atraumatic  Neck: no adenopathy, no JVD, supple, symmetrical, trachea midline, and thyroid not enlarged, symmetric, no tenderness/mass/nodules Lymph nodes: Cervical, supraclavicular, and axillary nodes normal. Resp: clear to auscultation bilaterally Back: symmetric, no curvature. ROM normal. No CVA tenderness. Cardio: regular rate and rhythm, S1, S2 normal, no murmur, click, rub or gallop GI: soft, non-tender; bowel sounds normal; no masses,  no organomegaly Extremities: extremities normal, atraumatic, no cyanosis or edema  ECOG PERFORMANCE STATUS: 1 - Symptomatic but completely ambulatory  Blood pressure (!) 158/84, pulse 75, resp. rate 17, weight 154 lb 5 oz (70 kg), SpO2 100 %.  LABORATORY DATA: Lab Results  Component Value Date   WBC 9.9 05/11/2021   HGB 14.4 05/11/2021   HCT 42.3 05/11/2021   MCV 87.2 05/11/2021   PLT 220 05/11/2021      Chemistry      Component Value Date/Time   NA 138 05/11/2021  1300   K 3.8 05/11/2021 1300   CL 103 05/11/2021 1300   CO2 27 05/11/2021 1300   BUN 16 05/11/2021 1300   CREATININE 0.90 05/11/2021 1300      Component Value Date/Time   CALCIUM 9.3 05/11/2021 1300   ALKPHOS 79 05/11/2021 1300   AST 27 05/11/2021 1300   ALT 24 05/11/2021 1300   BILITOT 0.5 05/11/2021 1300       RADIOGRAPHIC STUDIES: CT Chest W Contrast  Result Date: 05/12/2021 CLINICAL DATA:  Follow-up non-small cell lung cancer EXAM: CT CHEST WITH CONTRAST TECHNIQUE: Multidetector CT imaging of the chest was performed during intravenous contrast administration. CONTRAST:  5mL ISOVUE-300 IOPAMIDOL (ISOVUE-300) INJECTION 61% COMPARISON:  02/28/2021 FINDINGS: Cardiovascular: The heart is normal in size. No pericardial effusion. No evidence of thoracic aortic aneurysm. Atherosclerotic calcifications of the arch. Three vessel coronary atherosclerosis. Mediastinum/Nodes: No suspicious mediastinal lymphadenopathy. Multiple left thyroid nodules measuring up to 18 mm (series 2/image 15), unchanged. Lungs/Pleura: Radiation changes in the central left lower lobe (series 5/image 97) with volume loss, stable versus mildly improved. Additional mild radiation changes in the medial right hemithorax (for example, series 5/image 59). 2 mm right middle lobe nodule (series 8/image 33), unchanged, benign. No suspicious pulmonary nodules. Mild centrilobular emphysematous changes, upper lung predominant. No pleural effusion or pneumothorax. Upper Abdomen: Visualized upper abdomen is grossly unremarkable. Musculoskeletal: Degenerative changes of the visualized thoracolumbar spine. IMPRESSION: Radiation changes predominantly in the left lower lobe, stable versus mildly improved. No evidence of recurrent or metastatic disease. Multiple left thyroid nodules measuring up to 18 mm, unchanged. Recommend thyroid US (ref: J Am Coll Radiol. 2015 Feb;12(2): 143-50). Aortic Atherosclerosis (ICD10-I70.0) and Emphysema  (ICD10-J43.9). Electronically Signed   By: Julian Hy M.D.   On: 05/12/2021 11:40     ASSESSMENT AND PLAN: This is a very pleasant 80 years old African-American female recently diagnosed with a stage IIIb (T2a, N3, M0) non-small cell lung cancer, adenocarcinoma presented with left lower lobe lung mass in addition to left hilar and mediastinal lymphadenopathy as well as right and left cervical lymphadenopathy diagnosed in January 2022. The patient underwent a course of concurrent chemoradiation with weekly carboplatin and paclitaxel status post 5 cycles with partial response.   The patient underwent consolidation treatment with Imfinzi 1500 Mg IV every 4 weeks status post 3 cycles.  She has been tolerating this treatment well but during the last infusion she developed systemic inflammatory response syndrome with fever and blood work showed bacteremia secondary to Klebsiella pneumonia treated with antibiotic during her hospitalization. She has been on observation since that time  and she is feeling fine with no concerning complaints except for lack of energy. She had repeat CT scan of the chest performed recently.  I personally and independently reviewed the scan and discussed the results with the patient and her daughter. Her scan showed no concerning findings for disease recurrence or metastasis. I recommended for her to continue on observation with repeat CT scan of the chest in 6 months. Regarding the immunotherapy mediated hypothyroidism, I will start the patient on levothyroxine 50 mcg p.o. daily and she will follow-up with her primary care physician for adjustment of her dose as needed. She was advised to call immediately if she has any concerning symptoms in the interval. The patient voices understanding of current disease status and treatment options and is in agreement with the current care plan.  All questions were answered. The patient knows to call the clinic with any problems,  questions or concerns. We can certainly see the patient much sooner if necessary.  Disclaimer: This note was dictated with voice recognition software. Similar sounding words can inadvertently be transcribed and may not be corrected upon review.

## 2021-05-16 NOTE — Addendum Note (Signed)
Addended by: Ardeen Garland on: 05/16/2021 01:11 PM   Modules accepted: Orders

## 2021-05-23 ENCOUNTER — Other Ambulatory Visit: Payer: Self-pay | Admitting: *Deleted

## 2021-05-23 MED ORDER — FASENRA 30 MG/ML ~~LOC~~ SOSY
30.0000 mg | PREFILLED_SYRINGE | SUBCUTANEOUS | 6 refills | Status: DC
Start: 2021-05-23 — End: 2022-06-12

## 2021-05-31 ENCOUNTER — Ambulatory Visit (INDEPENDENT_AMBULATORY_CARE_PROVIDER_SITE_OTHER): Payer: Medicare HMO | Admitting: *Deleted

## 2021-05-31 DIAGNOSIS — I10 Essential (primary) hypertension: Secondary | ICD-10-CM

## 2021-05-31 DIAGNOSIS — C3492 Malignant neoplasm of unspecified part of left bronchus or lung: Secondary | ICD-10-CM

## 2021-05-31 NOTE — Chronic Care Management (AMB) (Signed)
Chronic Care Management   CCM RN Visit Note  05/31/2021 Name: Angelica [name] MRN: [id number] DOB: 22-Jun-1940  Subjective: Angelica [name] is a 81 y.o. year old female who is a primary care patient of [name], MD. The care management team was consulted for assistance with disease management and care coordination needs.    Engaged with patient's daughter/ caregiver Angelica [name], on [facility] DPR by telephone for follow up visit in response to provider referral for case management and/or care coordination services.   Consent to Services:  The patient was given information about Chronic Care Management services, agreed to services, and gave verbal consent prior to initiation of services.  Please see initial visit note for detailed documentation.  Patient agreed to services and verbal consent obtained.   Assessment: Review of patient past medical history, allergies, medications, health status, including review of consultants reports, laboratory and other test data, was performed as part of comprehensive evaluation and provision of chronic care management services.   CCM Care Plan Allergies  Allergen Reactions   Asa Buff (Mag [Buffered Aspirin] Other (See Comments)    Excessive sweating   Ensure [Nutritional Supplements]     Or boost - upset stomach    Fish Allergy Other (See Comments)    Unknown reaction, but allergic   Other Other (See Comments)    Gelcaps; gel-containing capsules; extended-release medication - upset stomach    Peanut-Containing Drug Products Other (See Comments)    From allergy test   Penicillins Itching    Has patient had a PCN reaction causing immediate rash, facial/tongue/throat swelling, SOB or lightheadedness with hypotension: No Has patient had a PCN reaction causing severe rash involving mucus membranes or skin necrosis: No Has patient had a PCN reaction that required hospitalization No Has patient had a PCN reaction occurring within the last 10 years: No If all of  the above answers are "NO", then may proceed with Cephalosporin use.   Shellfish-Derived Products Other (See Comments)    From allergy test   Strawberry Extract Other (See Comments)    From allergy test    Outpatient Encounter Medications as of 05/31/2021  Medication Sig   acetaminophen (TYLENOL) 325 MG tablet Take 1 tablet (325 mg total) by mouth every 6 (six) hours as needed for headache or mild pain.   alum & mag hydroxide-simeth (MAALOX/MYLANTA) 200-200-20 MG/5ML suspension Take 15 mLs by mouth every 6 (six) hours as needed for indigestion or heartburn.   amLODipine (NORVASC) 5 MG tablet Take 1 tablet (5 mg total) by mouth daily.   Benralizumab (FASENRA) 30 MG/ML SOSY Inject 1 mL (30 mg total) into the skin every 8 (eight) weeks.   budesonide-formoterol (SYMBICORT) 80-4.5 MCG/ACT inhaler Inhale 2 puffs into the lungs 2 (two) times daily.   Cholecalciferol (VITAMIN D3) 50 MCG (2000 UT) TABS Take 2,000 Units by mouth daily.   diclofenac Sodium (VOLTAREN) 1 % GEL Apply 4 g topically 4 (four) times daily.   EPINEPHrine (EPIPEN 2-PAK) 0.3 mg/0.3 mL IJ SOAJ injection Use as directed for severe allergic reaction   ipratropium (ATROVENT) 0.06 % nasal spray Place 2 sprays into both nostrils 4 (four) times daily as needed (nasal drainage).   levothyroxine (SYNTHROID) 50 MCG tablet Take 1 tablet (50 mcg total) by mouth daily before breakfast.   linaclotide (LINZESS) 72 MCG capsule TAKE 1 CAPSULE BY MOUTH ONCE DAILY AS NEEDED   loratadine (CLARITIN) 10 MG tablet Take 10 mg by mouth daily.   Magnesium Hydroxide (PHILLIPS  MILK OF MAGNESIA PO) Take 15-30 mLs by mouth daily as needed (for constipation).   montelukast (SINGULAIR) 10 MG tablet Take 1 tablet (10 mg total) by mouth at bedtime.   ondansetron (ZOFRAN) 8 MG tablet Take 1 tablet (8 mg total) by mouth every 8 (eight) hours as needed for nausea or vomiting.   polyethylene glycol (MIRALAX / GLYCOLAX) 17 g packet Take 17 g by mouth daily as needed for  mild constipation.   pravastatin (PRAVACHOL) 80 MG tablet Take 1 tablet (80 mg total) by mouth daily.   vitamin B-12 (CYANOCOBALAMIN) 1000 MCG tablet Take 1,000 mcg by mouth daily.   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   COVID-19 04/25/2021   Debility 02/01/2021   Gait disorder 02/01/2021   Malignant neoplasm of bronchus and lung ([place])    Port-A-Cath in place 11/02/2020   Adenocarcinoma of left lung, stage 3 ([place]) 06/22/2020   Lung mass    Pulmonary nodule 05/17/2020   Iron deficiency 05/03/2020   Swelling of lower extremity 07/30/2019   Ganglion cyst of right foot 01/16/2019   HLD (hyperlipidemia) 01/16/2019   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 pylori infection    Other allergic rhinitis 03/20/2016   Malnutrition of moderate degree 04/11/2015   HTN (hypertension) 04/10/2015   Conditions to be addressed/monitored:  HTN and cancer  Care Plan : RN Care Manager Plan of Care  Updates made by Angelica Royalty, RN since 05/31/2021 12:00 AM     Problem: Chronic Disease Management Needs   Priority: Medium     Long-Range Goal: Development of plan of care for long term chronic disease management   Start Date: 12/26/2020  Expected End Date: 12/26/2021  Priority: Medium  Note:   Current Barriers:  Chronic Disease Management support and education needs related to HTN and lung cancer New COVID positive test 04/25/21- caregiver reports COVID symptoms have resolved (05/31/21)  RNCM Clinical Goal(s):  Patient will demonstrate ongoing adherence to prescribed treatment plan for HTN  and cancer as evidenced by adherence to prescribed medication regimen contacting provider for new or worsened symptoms or questions attending all scheduled provider appointments, monitoring blood pressure at home weekly  through collaboration with RN Care manager, provider, and care team.   Interventions: 1:1 collaboration with primary care provider regarding development and update of comprehensive plan of care as evidenced by provider attestation and co-signature Inter-disciplinary care team collaboration (see longitudinal plan of care) Evaluation of current treatment plan related to  self management and patient's adherence to plan as established by provider;  Hypertension: (Status: Goal on Track (progressing): YES.) Last practice recorded BP readings:  BP Readings from Last 3 Encounters:  05/16/21 (!) 158/84  04/25/21 125/86  04/21/21 108/62  Most recent eGFR/CrCl: No results found for: EGFR  No components found for: CRCL  Evaluation of current treatment plan related to hypertension self management and patient's adherence to plan as established by provider;   Reviewed prescribed diet low salt, heart healthy Discussed plans with patient for ongoing care management follow up and provided patient with direct contact information for care management team; Confirmed no medication concerns; daughter continues  managing/ supervising medications, uses pill box- proactively verbalizes plans to discuss newly added dosing of levothyroxine with PCP, confirms patient has started taking as per oncology provider instructions Confirmed caregiver continues to monitor/ record blood pressures regularly, although they took a break over the holidays- caregiver does not have specific values to review, is at work, reports "all have been normal"  Discussed with caregiver her ongoing reported concern that patient's memory is declining: she tells me that memory lapses seem to focus on patient's medications-- she  frequently asks about taking her medicines right after she takes; again states this memory deficit is short-term memory only; we discussed possibility of chemo fog- Taron seems to think that is a possibility; again verbalizes plans to discuss with PCP- this was encouraged  Lung Cancer  (Status: Goal on Track (progressing): YES.) Evaluation of current treatment plan related to  lung cancer ,  self-management and patient's adherence to plan as established by provider. Discussed plans with patient for ongoing care management follow up and provided patient with direct contact information for care management team Confirmed patient attended oncology provider office visit 05/16/21-- reviewed with caregiver; patient has been released for surveillance care- to follow up with oncology again in 6 months; confirmed patient is taking levothyroxine as instructed during oncology office visit Reviewed scheduled/upcoming provider appointments including: 06/06/21- immunotherapy/ oncology infusion; 08/02/21- PCP; June 2023- oncology provider Confirmed Taron (daughter/ caregiver) continues to deny need for caregiver support resources-- she reports she will be taking a weekend trip soon with other family members to celebrate her birthday- patient will be staying at another family members home while Angelica [name] is away-- positive reinforcement provided with encouragement to continue efforts to maintaining self-care while also being primary caregiver for patient  Patient Goals/Self-Care Activities: Patient Alle/ Caregiver Angelica [name] will ensure that patient will: Take medications as prescribed Attend all scheduled provider appointments Call pharmacy for medication refills Call provider office for new concerns or questions Continue to periodically monitor blood pressures at home Continue to follow low-salt heart healthy diet    Continue to stay as active as possible Continue to take efforts to prevent falls       Plan: Telephone follow up appointment with care management team member scheduled for:  Wednesday, August 09, 2021 at 9:00 am The patient has been provided with contact information for the care management team and has been advised to call with any health related questions or concerns  [name], RN, BSN, [place] [phone]: direct office

## 2021-05-31 NOTE — Patient Instructions (Signed)
Visit [place], thank you for taking time to talk with me today about Oree's health care needs. Please don't hesitate to contact me if I can be of assistance to you before our next scheduled telephone appointment.  Below are the goals we discussed today:  Patient Self-Care Activities: Patient Jany/ Caregiver [name] will ensure that patient will: Take medications as prescribed Attend all scheduled provider appointments Call pharmacy for medication refills Call provider office for new concerns or questions Continue to periodically monitor blood pressures at home Continue to follow low-salt heart healthy diet    Continue to stay as active as possible Continue to take efforts to prevent falls  Our next scheduled telephone follow up visit/ appointment with care management team member is scheduled on:  Wednesday, August 09, 2021 at 9:00 am- This is a PHONE CALL appointment  If you need to cancel or re-schedule our visit, please call [phone] and our care guide team will be happy to assist you.   I look forward to hearing about your progress.   [name], RN, BSN, [place] [phone]: direct office  If you are experiencing a Mental Health or [place] or need someone to talk to, please  call the Suicide and Crisis Lifeline: 988 call the [place] National Suicide Prevention Lifeline: 724-781-6516 or TTY: 818-725-6934 TTY 815-312-6572) to talk to a trained counselor call 1-800-273-TALK (toll free, 24 hour hotline) go to [facility] [place] [phone]) call 911   Caregiver verbalizes understanding of instructions and care plan provided today and agrees to view in [place]. Active MyChart status confirmed with patient  Dementia Caregiver Guide Dementia is a term used to describe a number of symptoms that affect memory and thinking.  The most common symptoms include: Memory loss. Trouble with language and communication. Trouble concentrating. Poor judgment and problems with reasoning. Wandering from home or public places. Extreme anxiety or depression. Being suspicious or having angry outbursts and accusations. Child-like behavior and language. Dementia can be frightening and confusing. And taking care of someone with dementia can be challenging. This guide provides tips to help you when providing care for a person with dementia. How to help manage lifestyle changes Dementia usually gets worse slowly over time. In the early stages, people with dementia can stay independent and safe with some help. In later stages, they need help with daily tasks such as dressing, grooming, and using the bathroom. There are actions you can take to help a person manage his or her life while living with this condition. Communicating When the person is talking or seems frustrated, make eye contact and hold the person's hand. Ask specific questions that need yes or no answers. Use simple words, short sentences, and a calm voice. Only give one direction at a time. When offering choices, limit the person to just one or two. Avoid correcting the person in a negative way. If the person is struggling to find the right words, gently try to help him or her. Preventing injury  Keep floors clear of clutter. Remove rugs, magazine racks, and floor lamps. Keep hallways well lit, especially at night. Put a handrail and nonslip mat in the bathtub or shower. Put childproof locks on cabinets that contain dangerous items, such as medicines, alcohol, guns, toxic cleaning items, sharp tools or utensils, matches, and lighters. For doors to the outside of the house, put the locks in places where  the person cannot see or reach them easily. This will help ensure that the person does not wander out of the house and get lost. Be prepared for emergencies. Keep a list  of emergency phone numbers and addresses in a convenient area. Remove car keys and lock garage doors so that the person does not try to get in the car and drive. Have the person wear a bracelet that tracks locations and identifies the person as having memory problems. This should be worn at all times for safety. Helping with daily life  Keep the person on track with his or her routine. Try to identify areas where the person may need help. Be supportive, patient, calm, and encouraging. Gently remind the person that adjusting to changes takes time. Help with the tasks that the person has asked for help with. Keep the person involved in daily tasks and decisions as much as possible. Encourage conversation, but try not to get frustrated if the person struggles to find words or does not seem to appreciate your help. How to recognize stress Look for signs of stress in yourself and in the person you are caring for. If you notice signs of stress, take steps to manage it. Symptoms of stress include: Feeling anxious, irritable, frustrated, or angry. Denying that the person has dementia or that his or her symptoms will not improve. Feeling depressed, hopeless, or unappreciated. Difficulty sleeping. Difficulty concentrating. Developing stress-related health problems. Feeling like you have too little time for your own life. Follow these instructions at home: Take care of your health Make sure that you and the person you are caring for: Get regular sleep. Exercise regularly. Eat regular, nutritious meals. Take over-the-counter and prescription medicines only as told by your health care providers. Drink enough fluid to keep your urine pale yellow. Attend all scheduled health care appointments.  General instructions Join a support group with others who are caregivers. Ask about respite care resources. Respite care can provide short-term care for the person so that you can have a regular break from  the stress of caregiving. Consider any safety risks and take steps to avoid them. Organize medicines in a pill box for each day of the week. Create a plan to handle any legal or financial matters. Get legal or financial advice if needed. Keep a calendar in a central location to remind the person of appointments or other activities. Where to find support: Many individuals and organizations offer support. These include: Support groups for people with dementia. Support groups for caregivers. Counselors or therapists. Home health care services. Adult day care centers. Where to find more information Centers for Disease Control and Prevention: http://www.wolf.info/ Alzheimer's Association: [place] Family Caregiver Alliance: www.caregiver.[place]: www.alzfdn.org Contact a health care provider if: The person's health is rapidly getting worse. You are no longer able to care for the person. Caring for the person is affecting your physical and emotional health. You are feeling depressed or anxious about caring for the person. Get help right away if: The person threatens himself or herself, you, or anyone else. You feel depressed or sad, or feel that you want to harm yourself. If you ever feel like your loved one may hurt himself or herself or others, or if he or she shares thoughts about taking his or her own life, get help right away. You can go to your nearest emergency department or: Call your local emergency services (911 in the [place] Call a suicide crisis helpline, such as the  National Suicide Prevention Lifeline at 4352994335 or 988 in the [place]. This is open 24 hours a day in the [place] Text the Crisis Text Line at 9868710599 (in the [place].). Summary Dementia is a term used to describe a number of symptoms that affect memory and thinking. Dementia usually gets worse slowly over time. Take steps to reduce the person's risk of injury and to plan for future  care. Caregivers need support, relief from caregiving, and time for their own lives. This information is not intended to replace advice given to you by your health care provider. Make sure you discuss any questions you have with your health care provider. Document Revised: 11/30/2020 Document Reviewed: 09/21/2019 Elsevier Patient Education  [place].

## 2021-06-06 ENCOUNTER — Ambulatory Visit: Payer: Medicare HMO

## 2021-06-09 ENCOUNTER — Ambulatory Visit (INDEPENDENT_AMBULATORY_CARE_PROVIDER_SITE_OTHER): Payer: Medicare HMO | Admitting: Internal Medicine

## 2021-06-09 ENCOUNTER — Encounter: Payer: Self-pay | Admitting: Internal Medicine

## 2021-06-09 ENCOUNTER — Other Ambulatory Visit: Payer: Self-pay

## 2021-06-09 VITALS — BP 182/86 | HR 73 | Temp 98.6°F | Ht 65.0 in | Wt 155.0 lb

## 2021-06-09 DIAGNOSIS — R739 Hyperglycemia, unspecified: Secondary | ICD-10-CM

## 2021-06-09 DIAGNOSIS — E559 Vitamin D deficiency, unspecified: Secondary | ICD-10-CM | POA: Diagnosis not present

## 2021-06-09 DIAGNOSIS — E7849 Other hyperlipidemia: Secondary | ICD-10-CM

## 2021-06-09 DIAGNOSIS — I10 Essential (primary) hypertension: Secondary | ICD-10-CM | POA: Diagnosis not present

## 2021-06-09 DIAGNOSIS — E032 Hypothyroidism due to medicaments and other exogenous substances: Secondary | ICD-10-CM

## 2021-06-09 DIAGNOSIS — Z0001 Encounter for general adult medical examination with abnormal findings: Secondary | ICD-10-CM | POA: Diagnosis not present

## 2021-06-09 DIAGNOSIS — E538 Deficiency of other specified B group vitamins: Secondary | ICD-10-CM | POA: Diagnosis not present

## 2021-06-09 MED ORDER — AMLODIPINE BESYLATE 10 MG PO TABS: 10.0000 mg | ORAL_TABLET | Freq: Every day | ORAL | 3 refills | Status: DC

## 2021-06-09 NOTE — Patient Instructions (Addendum)
Ok to increase the amlodipine to 10 mg per day  Please continue all other medications as before, and stay on the same dose of thyroid medication for now  Please have the pharmacy call with any other refills you may need.  Please continue your efforts at being more active, low cholesterol diet, and weight control.  You are otherwise up to date with prevention measures today.  Please keep your appointments with your specialists as you may have planned  Please plan to have your lab work done at the [facility] at the 2nd or 3rd week of February, then we will see you back in march 2023 as already scheduled

## 2021-06-09 NOTE — Progress Notes (Signed)
Patient ID: Angelica [name], female   DOB: October 20, 1940, 81 y.o.   MRN: [id number]         Chief Complaint:: wellness exam and Office Visit (Check up)  For low thyroid, elevated BP, hld       HPI:  Angelica [name] is a 81 y.o. female here for wellness exam; declines pneumovax, tdap, shingirx, covid booster, o/w up to date                        Also Denies hyper or hypo thyroid symptoms such as voice, skin or hair change.  Pt denies chest pain, increased sob or doe, wheezing, orthopnea, PND, increased LE swelling, palpitations, dizziness or syncope.   Pt denies polydipsia, polyuria, or new focal neuro s/s.   Pt denies fever, wt loss, night sweats, loss of appetite, or other constitutional symptoms   Pt also here with daughter Angelica [name] by phone.  BP at home often in the 150s sbp.  Memory and cognitive function has been mild worsening recently and daughter remains more supportive  Denies worsening depressive symptoms, suicidal ideation, or panic Wt Readings from Last 3 Encounters:  06/09/21 155 lb (70.3 kg)  05/16/21 154 lb 5 oz (70 kg)  03/16/21 152 lb 3.2 oz (69 kg)   BP Readings from Last 3 Encounters:  06/09/21 (!) 182/86  05/16/21 (!) 158/84  04/25/21 125/86   Immunization History  Administered Date(s) Administered   Fluad Quad(high Dose 65+) 01/16/2019, 03/05/2020, 02/01/2021   Influenza, High Dose Seasonal PF 01/30/2018   PFIZER(Purple Top)SARS-COV-2 Vaccination 07/13/2019, 08/03/2019, 02/16/2020   Pneumococcal Conjugate-13 11/08/2017   There are no preventive care reminders to display for this patient.     Past Medical History:  Diagnosis Date   Arthritis    Asthma    Bowel obstruction ([place])    Cancer ([place])    Environmental allergies    GERD (gastroesophageal reflux disease)    Glaucoma 05/09/2017   H. pylori infection    History of radiation therapy 07/05/20-08/05/20   Left lung IMRT Dr. Sondra Come    HLD (hyperlipidemia) 01/16/2019   Hypertension    Iron deficiency anemia     Osteopenia 05/26/2017   Thyroid disease    Urticaria 07/25/2018   Past Surgical History:  Procedure Laterality Date   ABDOMINAL HYSTERECTOMY     BOWEL RESECTION N/A 05/23/2020   Procedure: SMALL BOWEL REPAIR;  Surgeon: Kieth Brightly, Arta Bruce, MD;  Location: [place];  Service: General;  Laterality: N/A;   BREAST BIOPSY     BRONCHIAL BIOPSY  06/10/2020   Procedure: BRONCHIAL BIOPSIES;  Surgeon: Garner Nash, DO;  Location: [facility];  Service: Pulmonary;;   BRONCHIAL BRUSHINGS  06/10/2020   Procedure: BRONCHIAL BRUSHINGS;  Surgeon: Garner Nash, DO;  Location: [facility];  Service: Pulmonary;;   BRONCHIAL NEEDLE ASPIRATION BIOPSY  06/10/2020   Procedure: BRONCHIAL NEEDLE ASPIRATION BIOPSIES;  Surgeon: Garner Nash, DO;  Location: [facility];  Service: Pulmonary;;   BRONCHIAL WASHINGS  06/10/2020   Procedure: BRONCHIAL WASHINGS;  Surgeon: Garner Nash, DO;  Location: [facility];  Service: Pulmonary;;   COLON SURGERY     DIAGNOSTIC LARYNGOSCOPY N/A 05/23/2020   Procedure: ATTEMPTED DIAGNOSTIC LAPAROSCOPY WITH ADHESIONS;  Surgeon: Kieth Brightly, Arta Bruce, MD;  Location: [place];  Service: General;  Laterality: N/A;   IR IMAGING GUIDED PORT INSERTION  07/15/2020   KNEE SURGERY     LAPAROTOMY N/A 04/18/2015   Procedure: Exploratory laparotomy with  lysis of adhesions, possible bowel resection;  Surgeon: Ralene Ok, MD;  Location: [place];  Service: General;  Laterality: N/A;   LAPAROTOMY N/A 05/23/2020   Procedure: EXPLORATORY LAPAROTOMY;  Surgeon: Kieth Brightly Arta Bruce, MD;  Location: [place];  Service: General;  Laterality: N/A;   LYSIS OF ADHESION N/A 05/23/2020   Procedure: LYSIS OF ADHESION;  Surgeon: Kieth Brightly Arta Bruce, MD;  Location: [place];  Service: General;  Laterality: N/A;   NASAL SINUS SURGERY     THYROID SURGERY     VIDEO BRONCHOSCOPY WITH ENDOBRONCHIAL NAVIGATION Bilateral 06/10/2020   Procedure: VIDEO BRONCHOSCOPY WITH ENDOBRONCHIAL NAVIGATION;  Surgeon: Garner Nash, DO;  Location: [place];  Service: Pulmonary;  Laterality: Bilateral;   VIDEO BRONCHOSCOPY WITH ENDOBRONCHIAL ULTRASOUND  06/10/2020   Procedure: VIDEO BRONCHOSCOPY WITH ENDOBRONCHIAL ULTRASOUND;  Surgeon: Garner Nash, DO;  Location: [facility];  Service: Pulmonary;;    reports that she has quit smoking. Her smoking use included cigarettes. She has never used smokeless tobacco. She reports that she does not drink alcohol and does not use drugs. family history includes Diabetes in her mother; Glaucoma in her brother and sister; Hypertension in her father and mother. Allergies  Allergen Reactions   Asa Buff (Mag [Buffered Aspirin] Other (See Comments)    Excessive sweating   Ensure [Nutritional Supplements]     Or boost - upset stomach    Fish Allergy Other (See Comments)    Unknown reaction, but allergic   Other Other (See Comments)    Gelcaps; gel-containing capsules; extended-release medication - upset stomach    Peanut-Containing Drug Products Other (See Comments)    From allergy test   Penicillins Itching    Has patient had a PCN reaction causing immediate rash, facial/tongue/throat swelling, SOB or lightheadedness with hypotension: No Has patient had a PCN reaction causing severe rash involving mucus membranes or skin necrosis: No Has patient had a PCN reaction that required hospitalization No Has patient had a PCN reaction occurring within the last 10 years: No If all of the above answers are "NO", then may proceed with Cephalosporin use.   Shellfish-Derived Products Other (See Comments)    From allergy test   Strawberry Extract Other (See Comments)    From allergy test    Current Outpatient Medications on File Prior to Visit  Medication Sig Dispense Refill   acetaminophen (TYLENOL) 325 MG tablet Take 1 tablet (325 mg total) by mouth every 6 (six) hours as needed for headache or mild pain.     alum & mag hydroxide-simeth (MAALOX/MYLANTA) 200-200-20 MG/5ML suspension  Take 15 mLs by mouth every 6 (six) hours as needed for indigestion or heartburn.     Benralizumab (FASENRA) 30 MG/ML SOSY Inject 1 mL (30 mg total) into the skin every 8 (eight) weeks. 1 mL 6   budesonide-formoterol (SYMBICORT) 80-4.5 MCG/ACT inhaler Inhale 2 puffs into the lungs 2 (two) times daily. 10.2 g 5   Cholecalciferol (VITAMIN D3) 50 MCG (2000 UT) TABS Take 2,000 Units by mouth daily.     diclofenac Sodium (VOLTAREN) 1 % GEL Apply 4 g topically 4 (four) times daily. 100 g 0   EPINEPHrine (EPIPEN 2-PAK) 0.3 mg/0.3 mL IJ SOAJ injection Use as directed for severe allergic reaction 2 each 1   ipratropium (ATROVENT) 0.06 % nasal spray Place 2 sprays into both nostrils 4 (four) times daily as needed (nasal drainage). 15 mL 5   levothyroxine (SYNTHROID) 50 MCG tablet Take 1 tablet (50 mcg total) by  mouth daily before breakfast. 30 tablet 2   linaclotide (LINZESS) 72 MCG capsule TAKE 1 CAPSULE BY MOUTH ONCE DAILY AS NEEDED 30 capsule 0   loratadine (CLARITIN) 10 MG tablet Take 10 mg by mouth daily.     Magnesium Hydroxide (PHILLIPS MILK OF MAGNESIA PO) Take 15-30 mLs by mouth daily as needed (for constipation).     montelukast (SINGULAIR) 10 MG tablet Take 1 tablet (10 mg total) by mouth at bedtime. 30 tablet 5   ondansetron (ZOFRAN) 8 MG tablet Take 1 tablet (8 mg total) by mouth every 8 (eight) hours as needed for nausea or vomiting. 20 tablet 0   polyethylene glycol (MIRALAX / GLYCOLAX) 17 g packet Take 17 g by mouth daily as needed for mild constipation.     pravastatin (PRAVACHOL) 80 MG tablet Take 1 tablet (80 mg total) by mouth daily. 90 tablet 3   vitamin B-12 (CYANOCOBALAMIN) 1000 MCG tablet Take 1,000 mcg by mouth daily.     Current Facility-Administered Medications on File Prior to Visit  Medication Dose Route Frequency Provider Last Rate Last Admin   Benralizumab SOSY 30 mg  30 mg Subcutaneous Q8 Weeks Kennith Gain, MD   30 mg at 04/11/21 0909        ROS:  All others  reviewed and negative.  Objective        PE:  BP (!) 182/86 (BP Location: Right Arm, Patient Position: Sitting, Cuff Size: Normal)    Pulse 73    Temp 98.6 F (37 C) (Oral)    Ht 5\' 5"  (1.651 m)    Wt 155 lb (70.3 kg)    SpO2 99%    BMI 25.79 kg/m                 Constitutional: Pt appears in NAD               HENT: Head: NCAT.                Right Ear: External ear normal.                 Left Ear: External ear normal.                Eyes: . Pupils are equal, round, and reactive to light. Conjunctivae and EOM are normal               Nose: without d/c or deformity               Neck: Neck supple. Gross normal ROM               Cardiovascular: Normal rate and regular rhythm.                 Pulmonary/Chest: Effort normal and breath sounds without rales or wheezing.                Abd:  Soft, NT, ND, + BS, no organomegaly               Neurological: Pt is alert. At baseline orientation, motor grossly intact               Skin: Skin is warm. No rashes, no other new lesions, LE edema - none               Psychiatric: Pt behavior is normal without agitation   Micro: none  Cardiac tracings I have personally interpreted today:  none  Pertinent Radiological findings (  summarize): none   Lab Results  Component Value Date   WBC 9.9 05/11/2021   HGB 14.4 05/11/2021   HCT 42.3 05/11/2021   PLT 220 05/11/2021   GLUCOSE 92 05/11/2021   CHOL 208 (H) 05/03/2020   TRIG 99 05/30/2020   HDL 69.90 05/03/2020   LDLCALC 120 (H) 05/03/2020   ALT 24 05/11/2021   AST 27 05/11/2021   NA 138 05/11/2021   K 3.8 05/11/2021   CL 103 05/11/2021   CREATININE 0.90 05/11/2021   BUN 16 05/11/2021   CO2 27 05/11/2021   TSH 9.516 (H) 05/11/2021   INR 1.0 11/02/2020   HGBA1C 5.8 05/03/2020   Assessment/Plan:  Angelica [name] is a 81 y.o. Black or African American [2] female with  has a past medical history of Arthritis, Asthma, Bowel obstruction ([place]), Cancer ([place]), Environmental allergies, GERD  (gastroesophageal reflux disease), Glaucoma (05/09/2017), H. pylori infection, History of radiation therapy (07/05/20-08/05/20), HLD (hyperlipidemia) (01/16/2019), Hypertension, Iron deficiency anemia, Osteopenia (05/26/2017), Thyroid disease, and Urticaria (07/25/2018).  Encounter for well adult exam with abnormal findings Age and sex appropriate education and counseling updated with regular exercise and diet Referrals for preventative services - none needed Immunizations addressed - declines tdap, pneumovax, shingrix, covid booster Smoking counseling  - none needed Evidence for depression or other mood disorder - none significant Most recent labs reviewed. I have personally reviewed and have noted: 1) the patient's medical and social history 2) The patient's current medications and supplements 3) The patient's height, weight, and BMI have been recorded in the chart   Drug-induced hypothyroidism On new levothyroxin 50 since dec 27, too soon for f/u lab today, will plan on repeat lab 2nd wk of feb 2023 and daughter will take to [facility] lab (no appt needed), then f/u mar 2023 appt here as planned  HLD (hyperlipidemia) Lab Results  Component Value Date   LDLCALC 120 (H) 05/03/2020   Uncontrolled, goal ldl < 100, , pt to continue current statin pravachol 80 for now, declines change, for f/u with next lab   HTN (hypertension) BP Readings from Last 3 Encounters:  06/09/21 (!) 182/86  05/16/21 (!) 158/84  04/25/21 125/86   Unocontrolled, for increased amlodipine 10 qd, f/u bp at home and next visit  Followup: Return in about 8 weeks (around 08/02/2021).  Cathlean Cower, MD 06/10/2021 7:59 AM [facility]

## 2021-06-09 NOTE — Progress Notes (Signed)
1 

## 2021-06-10 DIAGNOSIS — Z0001 Encounter for general adult medical examination with abnormal findings: Secondary | ICD-10-CM | POA: Insufficient documentation

## 2021-06-10 NOTE — Assessment & Plan Note (Signed)
Lab Results  Component Value Date   LDLCALC 120 (H) 05/03/2020   Uncontrolled, goal ldl < 100, , pt to continue current statin pravachol 80 for now, declines change, for f/u with next lab

## 2021-06-10 NOTE — Assessment & Plan Note (Signed)
BP Readings from Last 3 Encounters:  06/09/21 (!) 182/86  05/16/21 (!) 158/84  04/25/21 125/86   Unocontrolled, for increased amlodipine 10 qd, f/u bp at home and next visit

## 2021-06-10 NOTE — Assessment & Plan Note (Signed)
On new levothyroxin 50 since dec 27, too soon for f/u lab today, will plan on repeat lab 2nd wk of feb 2023 and daughter will take to [facility] lab (no appt needed), then f/u mar 2023 appt here as planned

## 2021-06-10 NOTE — Assessment & Plan Note (Signed)
Age and sex appropriate education and counseling updated with regular exercise and diet Referrals for preventative services - none needed Immunizations addressed - declines tdap, pneumovax, shingrix, covid booster Smoking counseling  - none needed Evidence for depression or other mood disorder - none significant Most recent labs reviewed. I have personally reviewed and have noted: 1) the patient's medical and social history 2) The patient's current medications and supplements 3) The patient's height, weight, and BMI have been recorded in the chart

## 2021-06-20 DIAGNOSIS — C3492 Malignant neoplasm of unspecified part of left bronchus or lung: Secondary | ICD-10-CM | POA: Diagnosis not present

## 2021-06-20 DIAGNOSIS — I10 Essential (primary) hypertension: Secondary | ICD-10-CM | POA: Diagnosis not present

## 2021-06-22 ENCOUNTER — Other Ambulatory Visit: Payer: Self-pay | Admitting: Internal Medicine

## 2021-06-22 ENCOUNTER — Other Ambulatory Visit: Payer: Self-pay | Admitting: Allergy

## 2021-06-22 NOTE — Telephone Encounter (Signed)
Please refill as per office routine med refill policy (all routine meds to be refilled for 3 mo or monthly (per pt preference) up to one year from last visit, then month to month grace period for 3 mo, then further med refills will have to be denied) ? ?

## 2021-06-26 DIAGNOSIS — H40023 Open angle with borderline findings, high risk, bilateral: Secondary | ICD-10-CM | POA: Diagnosis not present

## 2021-06-26 DIAGNOSIS — H04123 Dry eye syndrome of bilateral lacrimal glands: Secondary | ICD-10-CM | POA: Diagnosis not present

## 2021-07-13 ENCOUNTER — Ambulatory Visit (INDEPENDENT_AMBULATORY_CARE_PROVIDER_SITE_OTHER): Payer: Medicare HMO | Admitting: Internal Medicine

## 2021-07-13 ENCOUNTER — Other Ambulatory Visit: Payer: Self-pay

## 2021-07-13 ENCOUNTER — Encounter: Payer: Self-pay | Admitting: Internal Medicine

## 2021-07-13 VITALS — BP 138/70 | HR 87 | Ht 65.0 in | Wt 156.0 lb

## 2021-07-13 DIAGNOSIS — M79604 Pain in right leg: Secondary | ICD-10-CM

## 2021-07-13 DIAGNOSIS — R1031 Right lower quadrant pain: Secondary | ICD-10-CM | POA: Diagnosis not present

## 2021-07-13 DIAGNOSIS — F0390 Unspecified dementia without behavioral disturbance: Secondary | ICD-10-CM | POA: Insufficient documentation

## 2021-07-13 DIAGNOSIS — I10 Essential (primary) hypertension: Secondary | ICD-10-CM

## 2021-07-13 DIAGNOSIS — R413 Other amnesia: Secondary | ICD-10-CM | POA: Diagnosis not present

## 2021-07-13 NOTE — Progress Notes (Signed)
Patient ID: Angelica [name], female   DOB: [birth year], 81 y.o.   MRN: [id number]        Chief Complaint: follow up right leg pain, memory loss, RLQ pain, worsening dementia       HPI:  Angelica [name] is a 81 y.o. female here with a 3rd daughter and notes from a daughter Angelica [name] who is primary; pt with dementia with severely limited history, but seems to have had increased right leg pain, possible with exertion in the past 2 wks, better with rest possibly, nothing else makes better or worse except massage of the calf.sometimes.  Has also worsening memory in hte last 6 months as well, requires near total support and care at home, family requesting evaluation.  Pt also mentions mild intermittent RLQ pain sharp without radiation n/v, fever, chills or overt GU symptoms - Denies urinary symptoms such as dysuria, frequency, urgency, flank pain, hematuria or n/v, fever, chills.   Pt denies chest pain, increased sob or doe, wheezing, orthopnea, PND, increased LE swelling, palpitations, dizziness or syncope.        Wt Readings from Last 3 Encounters:  07/13/21 156 lb (70.8 kg)  06/09/21 155 lb (70.3 kg)  05/16/21 154 lb 5 oz (70 kg)   BP Readings from Last 3 Encounters:  07/13/21 138/70  06/09/21 (!) 182/86  05/16/21 (!) 158/84         Past Medical History:  Diagnosis Date   Arthritis    Asthma    Bowel obstruction ([facility])    Cancer ([place])    Environmental allergies    GERD (gastroesophageal reflux disease)    Glaucoma 05/09/2017   H. pylori infection    History of radiation therapy 07/05/20-08/05/20   Left lung IMRT Dr. Sondra Come    HLD (hyperlipidemia) 01/16/2019   Hypertension    Iron deficiency anemia    Osteopenia 05/26/2017   Thyroid disease    Urticaria 07/25/2018   Past Surgical History:  Procedure Laterality Date   ABDOMINAL HYSTERECTOMY     BOWEL RESECTION N/A 05/23/2020   Procedure: SMALL BOWEL REPAIR;  Surgeon: Kieth Brightly, Arta Bruce, MD;  Location: [place];  Service: General;   Laterality: N/A;   BREAST BIOPSY     BRONCHIAL BIOPSY  06/10/2020   Procedure: BRONCHIAL BIOPSIES;  Surgeon: Garner Nash, DO;  Location: [facility] [facility];  Service: Pulmonary;;   BRONCHIAL BRUSHINGS  06/10/2020   Procedure: BRONCHIAL BRUSHINGS;  Surgeon: Garner Nash, DO;  Location: [place];  Service: Pulmonary;;   BRONCHIAL NEEDLE ASPIRATION BIOPSY  06/10/2020   Procedure: BRONCHIAL NEEDLE ASPIRATION BIOPSIES;  Surgeon: Garner Nash, DO;  Location: [place] [facility];  Service: Pulmonary;;   BRONCHIAL WASHINGS  06/10/2020   Procedure: BRONCHIAL WASHINGS;  Surgeon: Garner Nash, DO;  Location: [place] [facility];  Service: Pulmonary;;   COLON SURGERY     DIAGNOSTIC LARYNGOSCOPY N/A 05/23/2020   Procedure: ATTEMPTED DIAGNOSTIC LAPAROSCOPY WITH ADHESIONS;  Surgeon: Kieth Brightly, Arta Bruce, MD;  Location: [place];  Service: General;  Laterality: N/A;   IR IMAGING GUIDED PORT INSERTION  07/15/2020   KNEE SURGERY     LAPAROTOMY N/A 04/18/2015   Procedure: Exploratory laparotomy with lysis of adhesions, possible bowel resection;  Surgeon: Ralene Ok, MD;  Location: [place];  Service: General;  Laterality: N/A;   LAPAROTOMY N/A 05/23/2020   Procedure: EXPLORATORY LAPAROTOMY;  Surgeon: Mickeal Skinner, MD;  Location: [place];  Service: General;  Laterality: N/A;   LYSIS OF ADHESION N/A 05/23/2020  Procedure: LYSIS OF ADHESION;  Surgeon: Kinsinger, Arta Bruce, MD;  Location: [place];  Service: General;  Laterality: N/A;   NASAL SINUS SURGERY     THYROID SURGERY     VIDEO BRONCHOSCOPY WITH ENDOBRONCHIAL NAVIGATION Bilateral 06/10/2020   Procedure: VIDEO BRONCHOSCOPY WITH ENDOBRONCHIAL NAVIGATION;  Surgeon: Garner Nash, DO;  Location: [place];  Service: Pulmonary;  Laterality: Bilateral;   VIDEO BRONCHOSCOPY WITH ENDOBRONCHIAL ULTRASOUND  06/10/2020   Procedure: VIDEO BRONCHOSCOPY WITH ENDOBRONCHIAL ULTRASOUND;  Surgeon: Garner Nash, DO;  Location: [place] [facility];  Service: Pulmonary;;     reports that she has quit smoking. Her smoking use included cigarettes. She has never used smokeless tobacco. She reports that she does not drink alcohol and does not use drugs. family history includes Diabetes in her mother; Glaucoma in her brother and sister; Hypertension in her father and mother. Allergies  Allergen Reactions   Asa Buff (Mag [Buffered Aspirin] Other (See Comments)    Excessive sweating   Ensure [Nutritional Supplements]     Or boost - upset stomach    Fish Allergy Other (See Comments)    Unknown reaction, but allergic   Other Other (See Comments)    Gelcaps; gel-containing capsules; extended-release medication - upset stomach    Peanut-Containing Drug Products Other (See Comments)    From allergy test   Penicillins Itching    Has patient had a PCN reaction causing immediate rash, facial/tongue/throat swelling, SOB or lightheadedness with hypotension: No Has patient had a PCN reaction causing severe rash involving mucus membranes or skin necrosis: No Has patient had a PCN reaction that required hospitalization No Has patient had a PCN reaction occurring within the last 10 years: No If all of the above answers are "NO", then may proceed with Cephalosporin use.   Shellfish-Derived Products Other (See Comments)    From allergy test   Strawberry Extract Other (See Comments)    From allergy test    Current Outpatient Medications on File Prior to Visit  Medication Sig Dispense Refill   acetaminophen (TYLENOL) 325 MG tablet Take 1 tablet (325 mg total) by mouth every 6 (six) hours as needed for headache or mild pain.     alum & mag hydroxide-simeth (MAALOX/MYLANTA) 200-200-20 MG/5ML suspension Take 15 mLs by mouth every 6 (six) hours as needed for indigestion or heartburn.     amLODipine (NORVASC) 10 MG tablet Take 1 tablet (10 mg total) by mouth daily. 90 tablet 3   Benralizumab (FASENRA) 30 MG/ML SOSY Inject 1 mL (30 mg total) into the skin every 8 (eight) weeks. 1 mL 6    budesonide-formoterol (SYMBICORT) 80-4.5 MCG/ACT inhaler Inhale 2 puffs into the lungs 2 (two) times daily. 10.2 g 5   Cholecalciferol (VITAMIN D3) 50 MCG (2000 UT) TABS Take 2,000 Units by mouth daily.     diclofenac Sodium (VOLTAREN) 1 % GEL Apply 4 g topically 4 (four) times daily. 100 g 0   EPINEPHrine (EPIPEN 2-PAK) 0.3 mg/0.3 mL IJ SOAJ injection Use as directed for severe allergic reaction 2 each 1   ipratropium (ATROVENT) 0.06 % nasal spray Place 2 sprays into both nostrils 4 (four) times daily as needed (nasal drainage). 15 mL 5   levothyroxine (SYNTHROID) 50 MCG tablet Take 1 tablet (50 mcg total) by mouth daily before breakfast. 30 tablet 2   linaclotide (LINZESS) 72 MCG capsule TAKE 1 CAPSULE BY MOUTH ONCE DAILY AS NEEDED 30 capsule 0   loratadine (CLARITIN) 10 MG tablet Take 10 mg by  mouth daily.     Magnesium Hydroxide (PHILLIPS MILK OF MAGNESIA PO) Take 15-30 mLs by mouth daily as needed (for constipation).     montelukast (SINGULAIR) 10 MG tablet TAKE 1 TABLET BY MOUTH AT BEDTIME 30 tablet 4   ondansetron (ZOFRAN) 8 MG tablet Take 1 tablet (8 mg total) by mouth every 8 (eight) hours as needed for nausea or vomiting. 20 tablet 0   polyethylene glycol (MIRALAX / GLYCOLAX) 17 g packet Take 17 g by mouth daily as needed for mild constipation.     pravastatin (PRAVACHOL) 80 MG tablet Take 1 tablet by mouth once daily 90 tablet 3   vitamin B-12 (CYANOCOBALAMIN) 1000 MCG tablet Take 1,000 mcg by mouth daily.     Current Facility-Administered Medications on File Prior to Visit  Medication Dose Route Frequency Provider Last Rate Last Admin   Benralizumab SOSY 30 mg  30 mg Subcutaneous Q8 Weeks Kennith Gain, MD   30 mg at 04/11/21 0909        ROS:  All others reviewed and negative.  Objective        PE:  BP 138/70 (BP Location: Right Arm, Patient Position: Sitting, Cuff Size: Large)    Pulse 87    Ht 5\' 5"  (1.651 m)    Wt 156 lb (70.8 kg)    SpO2 98%    BMI 25.96 kg/m                  Constitutional: Pt appears in NAD               HENT: Head: NCAT.                Right Ear: External ear normal.                 Left Ear: External ear normal.                Eyes: . Pupils are equal, round, and reactive to light. Conjunctivae and EOM are normal               Nose: without d/c or deformity               Neck: Neck supple. Gross normal ROM               Cardiovascular: Normal rate and regular rhythm.                 Pulmonary/Chest: Effort normal and breath sounds without rales or wheezing.                Abd:  Soft, NT, ND, + BS, no organomegaly - benign               Neurological: Pt is alert. At baseline orientation, motor grossly intact; dorsalis pedis pulse trace bilateral               Skin: Skin is warm. No rashes, no other new lesions, LE edema - none               Psychiatric: Pt behavior is normal without agitation   Micro: none  Cardiac tracings I have personally interpreted today:  none  Pertinent Radiological findings (summarize): none   Lab Results  Component Value Date   WBC 9.9 05/11/2021   HGB 14.4 05/11/2021   HCT 42.3 05/11/2021   PLT 220 05/11/2021   GLUCOSE 92 05/11/2021   CHOL 208 (H) 05/03/2020   TRIG 99  05/30/2020   HDL 69.90 05/03/2020   LDLCALC 120 (H) 05/03/2020   ALT 24 05/11/2021   AST 27 05/11/2021   NA 138 05/11/2021   K 3.8 05/11/2021   CL 103 05/11/2021   CREATININE 0.90 05/11/2021   BUN 16 05/11/2021   CO2 27 05/11/2021   TSH 9.516 (H) 05/11/2021   INR 1.0 11/02/2020   HGBA1C 5.8 05/03/2020   Assessment/Plan:  Angelica [name] is a 81 y.o. Black or African American [2] female with  has a past medical history of Arthritis, Asthma, Bowel obstruction ([employer]), Cancer ([place]), Environmental allergies, GERD (gastroesophageal reflux disease), Glaucoma (05/09/2017), H. pylori infection, History of radiation therapy (07/05/20-08/05/20), HLD (hyperlipidemia) (01/16/2019), Hypertension, Iron deficiency anemia, Osteopenia  (05/26/2017), Thyroid disease, and Urticaria (07/25/2018).  Right leg pain Etiology unclear - ? msk vs vascular insufficiency - for LE arterial study,  to f/u any worsening symptoms or concerns  Memory loss Worsening it seems ; for MRI brain, refer neurology per family request  RLQ abdominal pain Etiology unclear, exam benign, pt non toxic, for UA with labs,  to f/u any worsening symptoms or concerns  HTN (hypertension) BP Readings from Last 3 Encounters:  07/13/21 138/70  06/09/21 (!) 182/86  05/16/21 (!) 158/84   Stable, pt to continue medical treatment norvasc  Followup: Return in about 6 months (around 01/10/2022).  Cathlean Cower, MD 07/13/2021 8:36 PM [facility]

## 2021-07-13 NOTE — Patient Instructions (Signed)
Please continue all other medications as before, and refills have been done if requested.  Please have the pharmacy call with any other refills you may need.  Please continue your efforts at being more active, low cholesterol diet, and weight control.  Please keep your appointments with your specialists as you may have planned  You will be contacted regarding the referral for: circulation testing for the right leg, MRI for the brain, and Neurology  Please go to the LAB at the blood drawing area for the tests to be done  - next wk as your daughter mentioned she preferred  You will be contacted by phone if any changes need to be made immediately.  Otherwise, you will receive a letter about your results with an explanation, but please check with MyChart first.  Please remember to sign up for MyChart if you have not done so, as this will be important to you in the future with finding out test results, communicating by private email, and scheduling acute appointments online when needed.  Please make an Appointment to return in 6 months, or sooner if needed

## 2021-07-13 NOTE — Assessment & Plan Note (Signed)
Worsening it seems ; for MRI brain, refer neurology per family request

## 2021-07-13 NOTE — Assessment & Plan Note (Signed)
Etiology unclear, exam benign, pt non toxic, for UA with labs,  to f/u any worsening symptoms or concerns

## 2021-07-13 NOTE — Assessment & Plan Note (Signed)
BP Readings from Last 3 Encounters:  07/13/21 138/70  06/09/21 (!) 182/86  05/16/21 (!) 158/84   Stable, pt to continue medical treatment norvasc

## 2021-07-13 NOTE — Assessment & Plan Note (Signed)
Etiology unclear - ? msk vs vascular insufficiency - for LE arterial study,  to f/u any worsening symptoms or concerns

## 2021-07-14 ENCOUNTER — Encounter: Payer: Self-pay | Admitting: Physician Assistant

## 2021-07-18 ENCOUNTER — Other Ambulatory Visit: Payer: Self-pay

## 2021-07-18 ENCOUNTER — Ambulatory Visit
Admission: RE | Admit: 2021-07-18 | Discharge: 2021-07-18 | Disposition: A | Discharge status: Home / Self Care | Payer: Medicare HMO | Source: Ambulatory Visit | Attending: Internal Medicine | Admitting: Internal Medicine

## 2021-07-18 ENCOUNTER — Other Ambulatory Visit (INDEPENDENT_AMBULATORY_CARE_PROVIDER_SITE_OTHER): Payer: Medicare HMO

## 2021-07-18 DIAGNOSIS — E559 Vitamin D deficiency, unspecified: Secondary | ICD-10-CM

## 2021-07-18 DIAGNOSIS — E7849 Other hyperlipidemia: Secondary | ICD-10-CM

## 2021-07-18 DIAGNOSIS — E032 Hypothyroidism due to medicaments and other exogenous substances: Secondary | ICD-10-CM | POA: Diagnosis not present

## 2021-07-18 DIAGNOSIS — G319 Degenerative disease of nervous system, unspecified: Secondary | ICD-10-CM | POA: Diagnosis not present

## 2021-07-18 DIAGNOSIS — I639 Cerebral infarction, unspecified: Secondary | ICD-10-CM | POA: Diagnosis not present

## 2021-07-18 DIAGNOSIS — I6782 Cerebral ischemia: Secondary | ICD-10-CM | POA: Diagnosis not present

## 2021-07-18 DIAGNOSIS — R413 Other amnesia: Secondary | ICD-10-CM

## 2021-07-18 DIAGNOSIS — E538 Deficiency of other specified B group vitamins: Secondary | ICD-10-CM

## 2021-07-18 DIAGNOSIS — R739 Hyperglycemia, unspecified: Secondary | ICD-10-CM

## 2021-07-18 LAB — CBC WITH DIFFERENTIAL/PLATELET
Basophils Absolute: 0 10*3/uL (ref 0.0–0.1)
Basophils Relative: 0.5 % (ref 0.0–3.0)
Eosinophils Absolute: 0 10*3/uL (ref 0.0–0.7)
Eosinophils Relative: 0.1 % (ref 0.0–5.0)
HCT: 45.6 % (ref 36.0–46.0)
Hemoglobin: 15 g/dL (ref 12.0–15.0)
Lymphocytes Relative: 22.2 % (ref 12.0–46.0)
Lymphs Abs: 1.1 10*3/uL (ref 0.7–4.0)
MCHC: 32.8 g/dL (ref 30.0–36.0)
MCV: 90.2 fl (ref 78.0–100.0)
Monocytes Absolute: 0.4 10*3/uL (ref 0.1–1.0)
Monocytes Relative: 8.5 % (ref 3.0–12.0)
Neutro Abs: 3.5 10*3/uL (ref 1.4–7.7)
Neutrophils Relative %: 68.7 % (ref 43.0–77.0)
Platelets: 227 10*3/uL (ref 150.0–400.0)
RBC: 5.06 Mil/uL (ref 3.87–5.11)
RDW: 14.9 % (ref 11.5–15.5)
WBC: 5.1 10*3/uL (ref 4.0–10.5)

## 2021-07-18 LAB — HEPATIC FUNCTION PANEL
ALT: 13 U/L (ref 0–35)
AST: 24 U/L (ref 0–37)
Albumin: 4.4 g/dL (ref 3.5–5.2)
Alkaline Phosphatase: 92 U/L (ref 39–117)
Bilirubin, Direct: 0.1 mg/dL (ref 0.0–0.3)
Total Bilirubin: 0.6 mg/dL (ref 0.2–1.2)
Total Protein: 8.1 g/dL (ref 6.0–8.3)

## 2021-07-18 LAB — URINALYSIS, ROUTINE W REFLEX MICROSCOPIC
Bilirubin Urine: NEGATIVE
Hgb urine dipstick: NEGATIVE
Ketones, ur: NEGATIVE
Leukocytes,Ua: NEGATIVE
Nitrite: NEGATIVE
RBC / HPF: NONE SEEN (ref 0–?)
Specific Gravity, Urine: <=1.005 — AB (ref 1.000–1.030)
Total Protein, Urine: NEGATIVE
Urine Glucose: NEGATIVE
Urobilinogen, UA: 0.2 (ref 0.0–1.0)
WBC, UA: NONE SEEN (ref 0–?)
pH: 7 (ref 5.0–8.0)

## 2021-07-18 LAB — BASIC METABOLIC PANEL
BUN: 8 mg/dL (ref 6–23)
CO2: 27 mEq/L (ref 19–32)
Calcium: 10 mg/dL (ref 8.4–10.5)
Chloride: 103 mEq/L (ref 96–112)
Creatinine, Ser: 0.93 mg/dL (ref 0.40–1.20)
GFR: 57.62 mL/min — ABNORMAL LOW (ref >60.00)
Glucose, Bld: 84 mg/dL (ref 70–99)
Potassium: 4 mEq/L (ref 3.5–5.1)
Sodium: 139 mEq/L (ref 135–145)

## 2021-07-18 LAB — VITAMIN D 25 HYDROXY (VIT D DEFICIENCY, FRACTURES): VITD: 56.24 ng/mL (ref 30.00–100.00)

## 2021-07-18 LAB — LIPID PANEL
Cholesterol: 191 mg/dL (ref 0–200)
HDL: 77.5 mg/dL (ref >39.00)
LDL Cholesterol: 97 mg/dL (ref 0–99)
NonHDL: 113.57
Total CHOL/HDL Ratio: 2
Triglycerides: 83 mg/dL (ref 0.0–149.0)
VLDL: 16.6 mg/dL (ref 0.0–40.0)

## 2021-07-18 LAB — T4, FREE: Free T4: 0.84 ng/dL (ref 0.60–1.60)

## 2021-07-18 LAB — HEMOGLOBIN A1C: Hgb A1c MFr Bld: 5.8 % (ref 4.6–6.5)

## 2021-07-18 LAB — TSH: TSH: 3.94 u[IU]/mL (ref 0.35–5.50)

## 2021-07-18 LAB — VITAMIN B12: Vitamin B-12: 1036 pg/mL — ABNORMAL HIGH (ref 211–911)

## 2021-07-18 IMAGING — MR MR HEAD W/O CM
11 series · 48 of 48 positions shown · non-contrast
Comparison: Head CT [DATE].  Brain MRI [DATE].

CLINICAL DATA: Memory loss. Additional history provided by scanning
technologist: Memory loss for 6 months. History of non-small cell
lung cancer.

EXAM:
MRI HEAD WITHOUT CONTRAST
TECHNIQUE: Multiplanar, multiecho pulse sequences of the brain and surrounding
structures were obtained without intravenous contrast.

[Series 9: T1 · sagittal · 4.0mm · 0.75mm/px · 2 of 31 slices shown (1 of 2)]
[im 1/31]
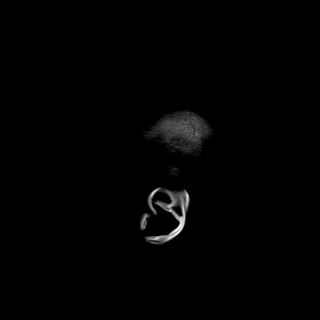
[im 31/31]
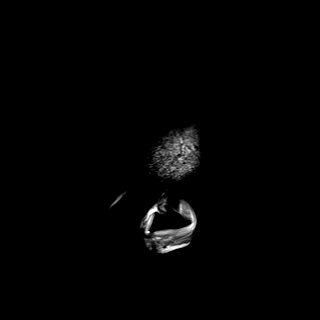

[Series 10: DWI · axial · 3.0mm · 0.94mm/px · z∈[-93,+46]mm · 10 of 160 slices shown (1 of 3)]
[im 1/160]
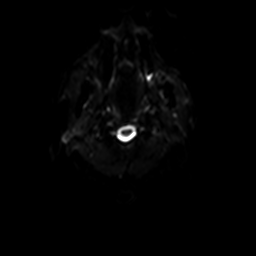
[im 18/160]
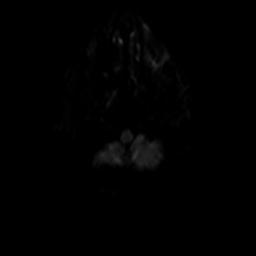
[im 36/160]
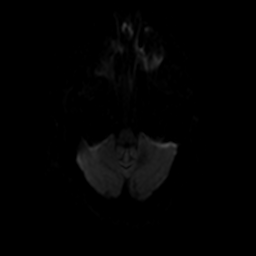
[im 54/160]
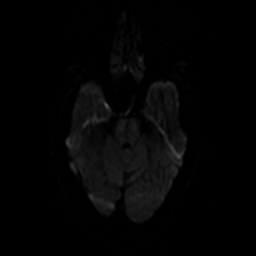
[im 71/160]
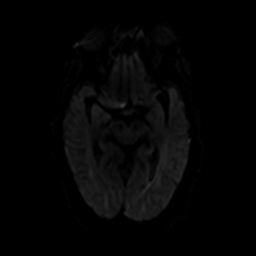
[im 89/160]
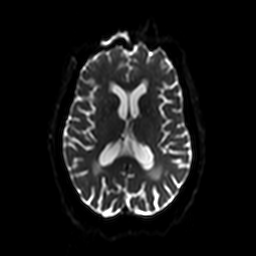
[im 107/160]
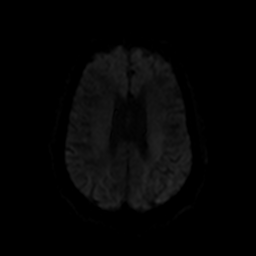
[im 124/160]
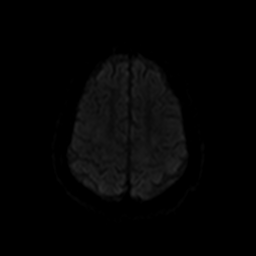
[im 142/160]
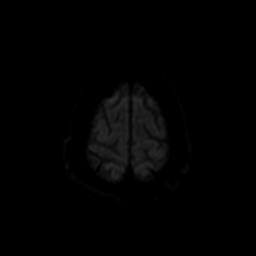
[im 160/160]
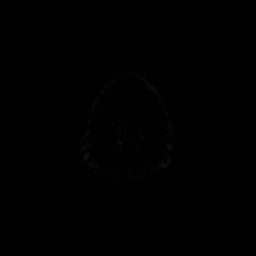

[Series 11: ax dwi_tracew · axial · 3.0mm · 0.94mm/px · z∈[-93,+46]mm · 5 of 80 slices shown]
[im 1/80]
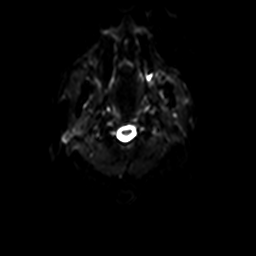
[im 20/80]
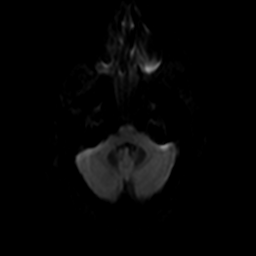
[im 40/80]
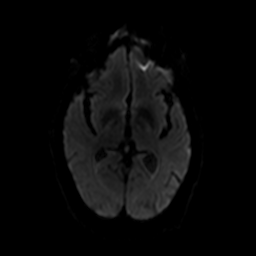
[im 60/80]
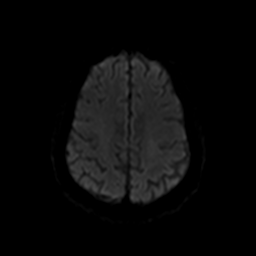
[im 80/80]
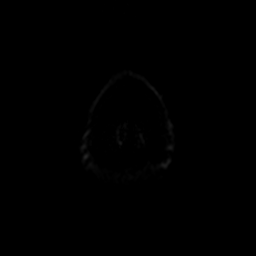

[Series 12: ax dwi_adc · axial · 3.0mm · 0.94mm/px · z∈[-93,+46]mm · 3 of 39 slices shown]
[im 1/39]
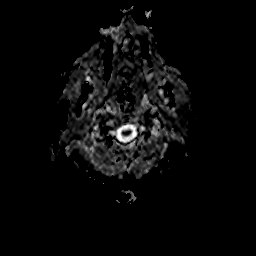
[im 20/39]
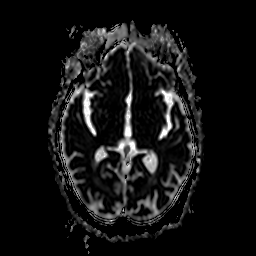
[im 39/39]
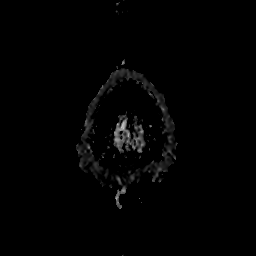

[Series 13: DWI · coronal · 5.0mm · 1.44mm/px · 4 of 60 slices shown (2 of 3)]
[im 1/60]
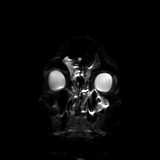
[im 20/60]
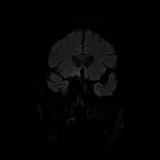
[im 40/60]
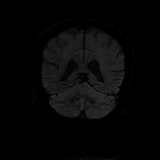
[im 60/60]
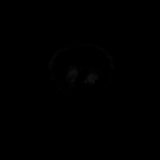

[Series 14: DWI · coronal · 5.0mm · 1.44mm/px · 2 of 30 slices shown (3 of 3)]
[im 1/30]
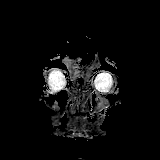
[im 30/30]
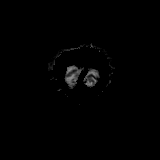

[Series 15: T2 · axial · 4.0mm · 0.36mm/px · z∈[-96,+49]mm · 2 of 29 slices shown (1 of 2)]
[im 1/29]
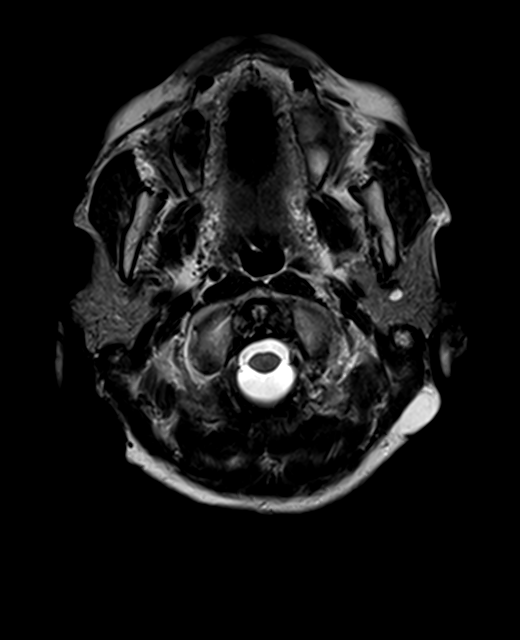
[im 29/29]
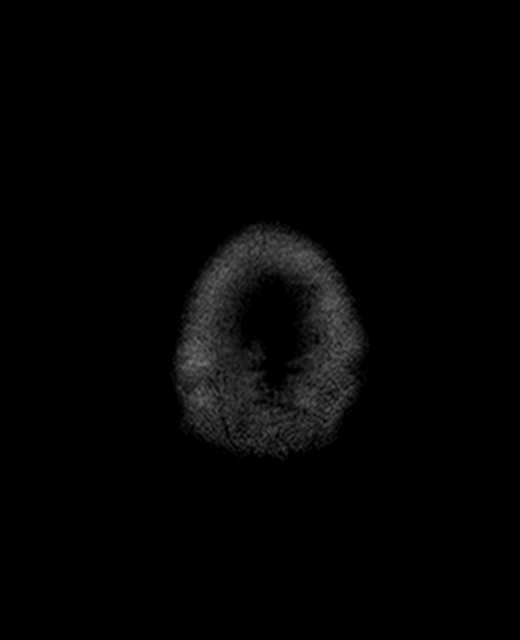

[Series 16: FLAIR · axial · 3.0mm · 0.72mm/px · z∈[-96,+53]mm · 2 of 26 slices shown]
[im 1/26]
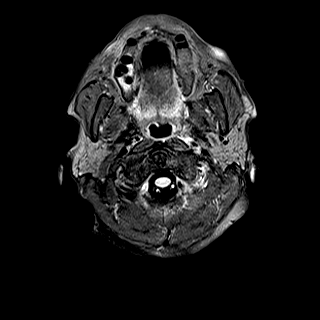
[im 26/26]
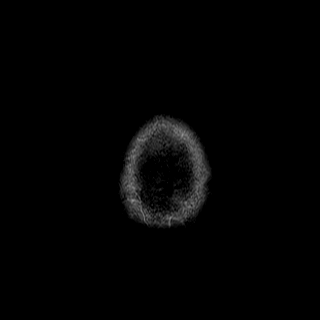

[Series 17: T1 · axial · 1.0mm · 0.94mm/px · z∈[-99,+58]mm · 10 of 159 slices shown (2 of 2)]
[im 1/159]
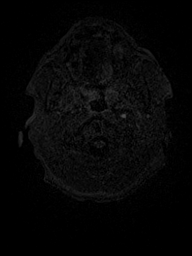
[im 18/159]
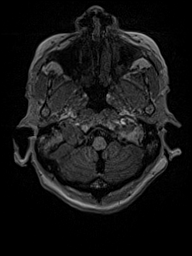
[im 36/159]
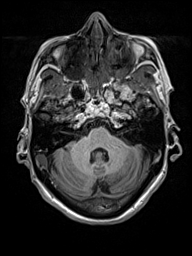
[im 53/159]
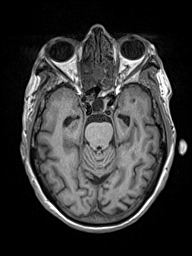
[im 71/159]
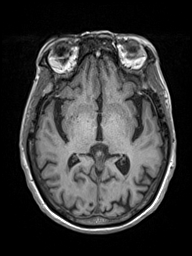
[im 88/159]
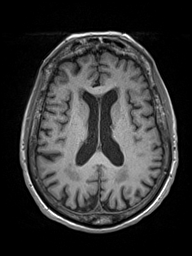
[im 106/159]
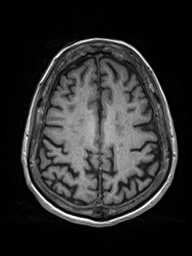
[im 123/159]
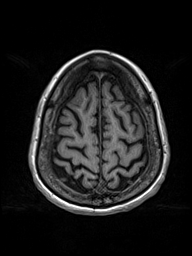
[im 141/159]
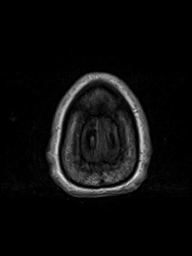
[im 159/159]
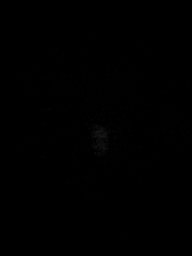

[Series 18: swi_images · axial · 1.5mm · 0.90mm/px · z∈[-95,+47]mm · 6 of 96 slices shown]
[im 1/96]
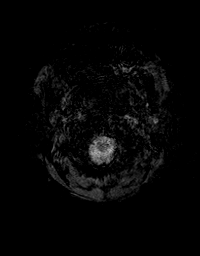
[im 20/96]
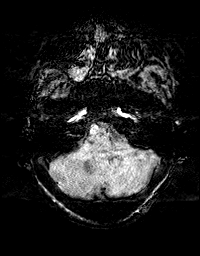
[im 39/96]
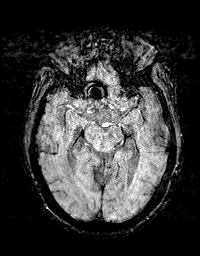
[im 58/96]
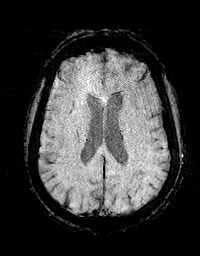
[im 77/96]
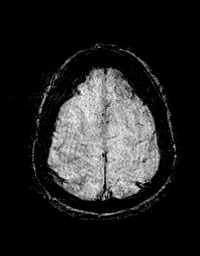
[im 96/96]
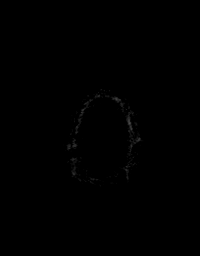

[Series 20: T2 · coronal · 4.5mm · 0.36mm/px · 2 of 30 slices shown (2 of 2)]
[im 1/30]
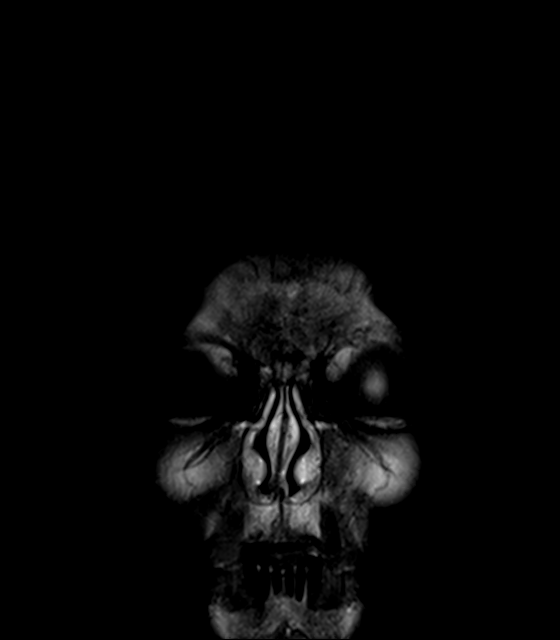
[im 30/30]
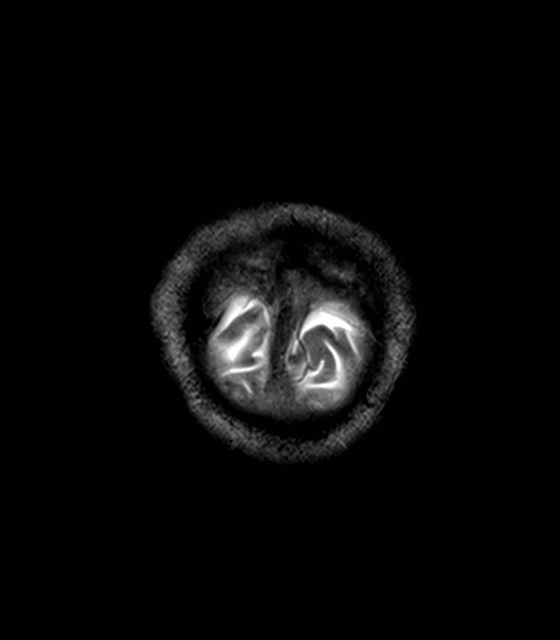

[48 of 48 positions shown; findings below may reference images not displayed]

FINDINGS: Mild intermittent motion degradation.

Brain:

Mild generalized cerebral and cerebellar atrophy.

Mild to moderate multifocal T2 FLAIR hyperintense signal abnormality
within the cerebral white matter, nonspecific but compatible with
chronic small vessel ischemic disease.

Small chronic infarcts within the right cerebellar hemisphere, which
appear new from the prior MRI of [DATE].

There is no acute infarct.

No evidence of an intracranial mass.

No chronic intracranial blood products.

No extra-axial fluid collection.

No midline shift.

Vascular: Maintained flow voids within the proximal large arterial
vessels.

Skull and upper cervical spine: No focal suspicious marrow lesion.

Sinuses/Orbits: Visualized orbits show no acute finding. Bilateral
ocular lens replacements. Severe mucosal thickening within the right
frontal sinus. Moderate mucosal thickening within the left frontal
sinus. Extensive opacification of the bilateral ethmoid air cells.
Near complete opacification of the left sphenoid sinus. Mucous
retention cysts measuring up to 2.4 cm, and background mild mucosal
thickening, within the right maxillary sinus. Fluid level, small
mucous retention cysts and moderate mucosal thickening within the
left maxillary sinus.

Other: Trace fluid within the bilateral mastoid air cells. Unchanged
4 mm round T2 hyperintense focus within the left parotid gland
(series 15, image 1).
IMPRESSION: No evidence of acute intracranial abnormality.

Mild-to-moderate chronic small vessel ischemic changes within the
cerebral white matter, slightly progressed from the prior brain MRI
[DATE].

Small chronic infarcts within the right cerebellar hemisphere, new
from the prior MRI.

Mild generalized cerebral and cerebellar atrophy.

Chronic pansinusitis, as described and progressed.

Unchanged 4 mm round T2 hyperintense focus within the left parotid
gland, which could reflect a small primary parotid neoplasm.

## 2021-07-20 ENCOUNTER — Ambulatory Visit: Payer: Medicare HMO | Admitting: Physician Assistant

## 2021-07-20 ENCOUNTER — Other Ambulatory Visit: Payer: Self-pay

## 2021-07-20 ENCOUNTER — Other Ambulatory Visit: Payer: Self-pay | Admitting: Internal Medicine

## 2021-07-20 ENCOUNTER — Encounter: Payer: Self-pay | Admitting: Physician Assistant

## 2021-07-20 VITALS — BP 154/83 | HR 80 | Resp 18 | Wt 153.0 lb

## 2021-07-20 DIAGNOSIS — I6381 Other cerebral infarction due to occlusion or stenosis of small artery: Secondary | ICD-10-CM

## 2021-07-20 DIAGNOSIS — F0150 Vascular dementia without behavioral disturbance: Secondary | ICD-10-CM

## 2021-07-20 DIAGNOSIS — F0390 Unspecified dementia without behavioral disturbance: Secondary | ICD-10-CM

## 2021-07-20 MED ORDER — CLOPIDOGREL BISULFATE 75 MG PO TABS: 75.0000 mg | ORAL_TABLET | Freq: Every day | ORAL | 3 refills | Status: DC

## 2021-07-20 MED ORDER — MEMANTINE HCL 5 MG PO TABS: ORAL_TABLET | ORAL | 11 refills | Status: DC

## 2021-07-20 NOTE — Progress Notes (Addendum)
Assessment/Plan:   Angelica [name] is a very pleasant 81 y.o. 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  Recent MRI of the brain 07/18/2021 is remarkable for mild cerebral and cerebellar atrophy, chronic small vessel ischemic disease, as well as small chronic infarcts including the right cerebellar hemisphere, as well as an unchanged left parotid gland focus without other the brain lesions.  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  Discussed the importance of regular daily schedule with inclusion of crossword puzzles to maintain brain function.  Continue to monitor mood with PCP.  Stay active at least 30 minutes at least 3 times a week.  Naps should be scheduled and should be no longer than 60 minutes and should not occur after 2 PM.  Recommend strong control of cardiovascular risk factors in view of the vascular findings in MRI. Mediterranean diet is recommended   Start Memantine 5 mg: Take 1 tablet (5 mg at night) for 2 weeks, then increase to 1 tablet (5 mg) twice a day due to moderate disease Folllow up 3 months   Subjective:    The patient is seen in neurologic consultation at the request of [name], MD for the evaluation of memory.  The patient is accompanied by her daughter who supplements the history. This is a 81 y.o. year old RH  female who has had memory issues for at least 1 year, especially noticeable after beginning chemotherapy in January 2022.  Memory difficulties are worse over the last 6 months, when she began  to repeat herself.  For example, her daughter reports that today, she asked her several times "did I take the medication this morning? Or "do we have an appointment this morning?  Short-term memory is worse than her long-term memory.  She denies being disoriented when walking into her room, or leaving objects in unusual places.  She ambulates without difficulty, denies any falls or head injuries.  Denies wandering off.  She does not drive anymore "since the cancer I am lazy and I do not want to drive ".  She lives with her daughter.  Her mood is good, denies any depression or irritability.  During the day, she enjoys doing crossword puzzles, word finding, watching TV.  She reports sleeping "great ".  She denies any vivid dreams or sleepwalking.  Denies hallucinations or paranoia.  There are no hygiene concerns, she is independent of bathing and dressing.  The medications are placed on the pillbox, and she denies forgetting any doses.  Her daughter monitors closely.  Her daughter is in charge of the finances.  Her appetite is good, denies trouble swallowing.  She cooks and denies leaving the stove on.  A while ago, she left the faucet on accidentally and flooded the whole bathroom.  She denies any headaches, double vision, dizziness, focal numbness or tingling, unilateral weakness, tremors or anosmia.  In December 2021 she had Covid without requiring hospitalization.  No history of seizures. Denies urine incontinence, retention, constipation or diarrhea.  Denies OSA, ETOH or Tobacco. Family History Fa had Alzheimer's disease  Labs 07/18/2021  showed TSH 3.94, T4 0.84 B12 1036, vitamin D 56.24, and A1c 5.8, normal hepatic function panel, lipid panel and CBC.  MRI of the brain without contrast on 07/18/2021 was remarkable for mild to moderate chronic small vessel ischemic changes within the cerebral white matter, slightly progressing from prior brain MRI on 06/14/2020, small chronic infarcts within the right cerebellar  hemisphere, which is new from the prior MRI, mild generalized cerebral and cerebellar atrophy, no acute findings.  Allergies  Allergen Reactions   Asa Buff (Mag [Buffered Aspirin] Other (See Comments)    Excessive sweating   Ensure [Nutritional Supplements]     Or boost - upset stomach    Fish Allergy Other (See Comments)    Unknown reaction, but allergic   Other Other (See Comments)    Gelcaps; gel-containing capsules; extended-release medication - upset stomach    Peanut-Containing Drug Products Other (See Comments)    From allergy test   Penicillins Itching    Has patient had a PCN reaction causing immediate rash, facial/tongue/throat swelling, SOB or lightheadedness with hypotension: No Has patient had a PCN reaction causing severe rash involving mucus membranes or skin necrosis: No Has patient had a PCN reaction that required hospitalization No Has patient had a PCN reaction occurring within the last 10 years: No If all of the above answers are "NO", then may proceed with Cephalosporin use.   Shellfish-Derived Products Other (See Comments)    From allergy test   Strawberry Extract Other (See Comments)    From allergy test     Current Outpatient Medications  Medication Instructions   acetaminophen (TYLENOL) 325 mg, Oral, Every 6 hours PRN   alum & mag hydroxide-simeth (MAALOX/MYLANTA) 200-200-20 MG/5ML suspension 15 mLs, Oral, Every 6 hours PRN   amLODipine (NORVASC) 10 mg, Oral, Daily   budesonide-formoterol (SYMBICORT) 80-4.5 MCG/ACT inhaler 2 puffs, Inhalation, 2 times daily   clopidogrel (PLAVIX) 75 mg, Oral, Daily   diclofenac Sodium (VOLTAREN) 4 g, Topical, 4 times daily   EPINEPHrine (EPIPEN 2-PAK) 0.3 mg/0.3 mL IJ SOAJ injection Use as directed for severe allergic reaction   Fasenra 30 mg, Subcutaneous, Every 8 weeks   ipratropium (ATROVENT) 0.06 % nasal spray 2 sprays, Each Nare, 4 times daily PRN   levothyroxine (SYNTHROID) 50 mcg, Oral, Daily before breakfast    linaclotide (LINZESS) 72 MCG capsule TAKE 1 CAPSULE BY MOUTH ONCE DAILY AS NEEDED   loratadine (CLARITIN) 10 mg, Oral, Daily   Magnesium Hydroxide (PHILLIPS MILK OF MAGNESIA PO) 15-30 mLs, Oral, Daily PRN   memantine (NAMENDA) 5 MG tablet Take 1 tablet (5 mg at night) for 2 weeks, then increase to 1 tablet (5mg ) twice a day   montelukast (SINGULAIR) 10 MG tablet TAKE 1 TABLET BY MOUTH AT BEDTIME   ondansetron (ZOFRAN) 8 mg, Oral, Every 8 hours PRN   polyethylene glycol (MIRALAX / GLYCOLAX) 17 g, Oral, Daily PRN   pravastatin (PRAVACHOL) 80 MG tablet Take 1 tablet by mouth once daily   vitamin B-12 (CYANOCOBALAMIN) 1,000 mcg, Oral, Daily   Vitamin D3 2,000 Units, Oral, Daily     VITALS:   Vitals:   07/20/21 0754  BP: (!) 154/83  Pulse: 80  Resp: 18  SpO2: 100%  Weight: 153 lb (69.4 kg)      PHYSICAL EXAM   HEENT:  Normocephalic, atraumatic. The mucous membranes are moist. The superficial temporal arteries are without ropiness or tenderness. Cardiovascular: Regular rate and rhythm. Lungs: Clear to auscultation bilaterally. Neck: There are no carotid  bruits noted bilaterally.  NEUROLOGICAL: Montreal Cognitive Assessment  07/20/2021  Visuospatial/ Executive (0/5) 1  Naming (0/3) 3  Attention: Read list of digits (0/2) 2  Attention: Read list of letters (0/1) 1  Attention: Serial 7 subtraction starting at 100 (0/3) 0  Language: Repeat phrase (0/2) 1  Language : Fluency (0/1) 0  Abstraction (0/2) 1  Delayed Recall (0/5) 0  Orientation (0/6) 1  Total 10  Adjusted Score (based on education) 11   No flowsheet data found.  No flowsheet data found.   Orientation:  Alert and oriented to person, not to place or time.  No aphasia or dysarthria. Fund of knowledge is reduced.. Recent memory impaired and remote memory intact.  Attention and concentration are reduced.  Able to name objects and repeat phrases 1/2. Delayed recall 0/5 Cranial nerves: There is good facial symmetry.  Extraocular muscles are intact and visual fields are full to confrontational testing. Speech is fluent and clear. Soft palate rises symmetrically and there is no tongue deviation. Hearing is intact to conversational tone. Tone: Tone is good throughout. Sensation: Sensation is intact to light touch and pinprick throughout. Vibration is intact at the bilateral big toe.There is no extinction with double simultaneous stimulation. There is no sensory dermatomal level identified. Coordination: The patient has no difficulty with RAM's or FNF bilaterally. Normal finger to nose  Motor: Strength is 5/5 in the bilateral upper and lower extremities. There is no pronator drift. There are no fasciculations noted. DTR's: Deep tendon reflexes are 2/4 at the bilateral biceps, triceps, brachioradialis, patella and achilles.  Plantar responses are downgoing bilaterally. Gait and Station: The patient is able to ambulate without difficulty.The patient is able to heel toe walk without any difficulty.The patient is able to ambulate in a tandem fashion. The patient is able to stand in the Romberg position.     Thank you for allowing [place] the opportunity to participate in the care of this nice patient. Please do not hesitate to contact us for any questions or concerns.   Total time spent on today's visit was 60 minutes, including both face-to-face time and nonface-to-face time.  Time included that spent on review of records (prior notes available to me/labs/imaging if pertinent), discussing treatment and goals, answering patient's questions and coordinating care.  Cc:  [name], MD  Sharene Butters 07/21/2021 6:58 AM

## 2021-07-20 NOTE — Patient Instructions (Addendum)
It was a pleasure to see you today at our office.   Recommendations:  Follow up  in 3 months  Start Memantine 5mg  tablets.  Take 1 tablet at bedtime for 2 weeks, then 1 tablet twice daily.   Side effects include dizziness, headache, diarrhea or constipation.  Call with any questions or concerns.  Control all your cardiovascular risk factors    RECOMMENDATIONS FOR ALL PATIENTS WITH MEMORY PROBLEMS: 1. Continue to exercise (Recommend 30 minutes of walking everyday, or 3 hours every week) 2. Increase social interactions - continue going to [place] and enjoy social gatherings with friends and family 3. Eat healthy, avoid fried foods and eat more fruits and vegetables 4. Maintain adequate blood pressure, blood sugar, and blood cholesterol level. Reducing the risk of stroke and cardiovascular disease also helps promoting better memory. 5. Avoid stressful situations. Live a simple life and avoid aggravations. Organize your time and prepare for the next day in anticipation. 6. Sleep well, avoid any interruptions of sleep and avoid any distractions in the bedroom that may interfere with adequate sleep quality 7. Avoid sugar, avoid sweets as there is a strong link between excessive sugar intake, diabetes, and cognitive impairment We discussed the Mediterranean diet, which has been shown to help patients reduce the risk of progressive memory disorders and reduces cardiovascular risk. This includes eating fish, eat fruits and green leafy vegetables, nuts like almonds and hazelnuts, walnuts, and also use olive oil. Avoid fast foods and fried foods as much as possible. Avoid sweets and sugar as sugar use has been linked to worsening of memory function.  There is always a concern of gradual progression of memory problems. If this is the case, then we may need to adjust level of care according to patient needs. Support, both to the patient and caregiver, should then be put into place.      You have been  referred for a neuropsychological evaluation (i.e., evaluation of memory and thinking abilities). Please bring someone with you to this appointment if possible, as it is helpful for the doctor to hear from both you and another adult who knows you well. Please bring eyeglasses and hearing aids if you wear them.    The evaluation will take approximately 3 hours and has two parts:   The first part is a clinical interview with the neuropsychologist (Dr. Melvyn Novas or Dr. Nicole Kindred). During the interview, the neuropsychologist will speak with you and the individual you brought to the appointment.    The second part of the evaluation is testing with the doctor's technician Hinton Dyer or Maudie Mercury). During the testing, the technician will ask you to remember different types of material, solve problems, and answer some questionnaires. Your family member will not be present for this portion of the evaluation.   Please note: We must reserve several hours of the neuropsychologist's time and the psychometrician's time for your evaluation appointment. As such, there is a No-Show fee of $100. If you are unable to attend any of your appointments, please contact our office as soon as possible to reschedule.    FALL PRECAUTIONS: Be cautious when walking. Scan the area for obstacles that may increase the risk of trips and falls. When getting up in the mornings, sit up at the edge of the bed for a few minutes before getting out of bed. Consider elevating the bed at the head end to avoid drop of blood pressure when getting up. Walk always in a well-lit room (use night lights in the  walls). Avoid area rugs or power cords from appliances in the middle of the walkways. Use a walker or a cane if necessary and consider physical therapy for balance exercise. Get your eyesight checked regularly.  FINANCIAL OVERSIGHT: Supervision, especially oversight when making financial decisions or transactions is also recommended.  HOME SAFETY: Consider the  safety of the kitchen when operating appliances like stoves, microwave oven, and blender. Consider having supervision and share cooking responsibilities until no longer able to participate in those. Accidents with firearms and other hazards in the house should be identified and addressed as well.   ABILITY TO BE LEFT ALONE: If patient is unable to contact 911 operator, consider using LifeLine, or when the need is there, arrange for someone to stay with patients. Smoking is a fire hazard, consider supervision or cessation. Risk of wandering should be assessed by caregiver and if detected at any point, supervision and safe proof recommendations should be instituted.  MEDICATION SUPERVISION: Inability to self-administer medication needs to be constantly addressed. Implement a mechanism to ensure safe administration of the medications.   DRIVING: Regarding driving, in patients with progressive memory problems, driving will be impaired. We advise to have someone else do the driving if trouble finding directions or if minor accidents are reported. Independent driving assessment is available to determine safety of driving.   If you are interested in the driving assessment, you can contact the following:  [employer] in [place]  [place] [phone] or [phone]    [place] refers to food and lifestyle choices that are based on the traditions of countries located on the [employer]. This way of eating has been shown to help prevent certain conditions and improve outcomes for people who have chronic diseases, like kidney disease and heart disease. What are tips for following this plan? Lifestyle  Cook and eat meals together with your family, when possible. Drink enough fluid to keep your urine clear or pale yellow. Be physically active  every day. This includes: Aerobic exercise like running or swimming. Leisure activities like gardening, walking, or housework. Get 7-8 hours of sleep each night. If recommended by your health care provider, drink red wine in moderation. This means 1 glass a day for nonpregnant women and 2 glasses a day for men. A glass of wine equals 5 oz (150 mL). Reading food labels  Check the serving size of packaged foods. For foods such as rice and pasta, the serving size refers to the amount of cooked product, not dry. Check the total fat in packaged foods. Avoid foods that have saturated fat or trans fats. Check the ingredients list for added sugars, such as corn syrup. Shopping  At the grocery store, buy most of your food from the areas near the walls of the store. This includes: Fresh fruits and vegetables (produce). Grains, beans, nuts, and seeds. Some of these may be available in unpackaged forms or large amounts (in bulk). Fresh seafood. Poultry and eggs. Low-fat dairy products. Buy whole ingredients instead of prepackaged foods. Buy fresh fruits and vegetables in-season from local farmers markets. Buy frozen fruits and vegetables in resealable bags. If you do not have access to quality fresh seafood, buy precooked frozen shrimp or canned fish, such as tuna, salmon, or sardines. Buy small amounts of raw or cooked vegetables, salads, or olives from the deli or salad bar at your store. Stock Conservator, museum/gallery so you  always have certain foods on hand, such as olive oil, canned tuna, canned tomatoes, rice, pasta, and beans. Cooking  Cook foods with extra-virgin olive oil instead of using butter or other vegetable oils. Have meat as a side dish, and have vegetables or grains as your main dish. This means having meat in small portions or adding small amounts of meat to foods like pasta or stew. Use beans or vegetables instead of meat in common dishes like chili or lasagna. Experiment with different cooking  methods. Try roasting or broiling vegetables instead of steaming or sauteing them. Add frozen vegetables to soups, stews, pasta, or rice. Add nuts or seeds for added healthy fat at each meal. You can add these to yogurt, salads, or vegetable dishes. Marinate fish or vegetables using olive oil, lemon juice, garlic, and fresh herbs. Meal planning  Plan to eat 1 vegetarian meal one day each week. Try to work up to 2 vegetarian meals, if possible. Eat seafood 2 or more times a week. Have healthy snacks readily available, such as: Vegetable sticks with hummus. Greek yogurt. Fruit and nut trail mix. Eat balanced meals throughout the week. This includes: Fruit: 2-3 servings a day Vegetables: 4-5 servings a day Low-fat dairy: 2 servings a day Fish, poultry, or lean meat: 1 serving a day Beans and legumes: 2 or more servings a week Nuts and seeds: 1-2 servings a day Whole grains: 6-8 servings a day Extra-virgin olive oil: 3-4 servings a day Limit red meat and sweets to only a few servings a month What are my food choices? Mediterranean diet Recommended Grains: Whole-grain pasta. Brown rice. Bulgar wheat. Polenta. Couscous. Whole-wheat bread. [name]. Vegetables: Artichokes. Beets. Broccoli. Cabbage. Carrots. Eggplant. Green beans. Chard. Kale. Spinach. Onions. Leeks. Peas. Squash. Tomatoes. Peppers. Radishes. Fruits: Apples. Apricots. Avocado. Berries. Bananas. Cherries. Dates. Figs. Grapes. Lemons. Melon. Oranges. Peaches. Plums. Pomegranate. Meats and other protein foods: Beans. Almonds. Sunflower seeds. Pine nuts. Peanuts. [place]. Salmon. Scallops. Shrimp. [place]. Tilapia. Clams. Oysters. Eggs. Dairy: Low-fat milk. Cheese. Greek yogurt. Beverages: Water. Red wine. Herbal tea. Fats and oils: Extra virgin olive oil. Avocado oil. Grape seed oil. Sweets and desserts: [place] yogurt with honey. Baked apples. Poached pears. Trail mix. Seasoning and other foods: Basil. Cilantro. Coriander.  Cumin. Mint. Parsley. Sage. Rosemary. Tarragon. Garlic. Oregano. Thyme. Pepper. Balsalmic vinegar. Tahini. Hummus. Tomato sauce. Olives. Mushrooms. Limit these Grains: Prepackaged pasta or rice dishes. Prepackaged cereal with added sugar. Vegetables: Deep fried potatoes (french fries). Fruits: Fruit canned in syrup. Meats and other protein foods: Beef. Pork. Lamb. Poultry with skin. Hot dogs. [place] Salines. Dairy: Ice cream. Sour cream. Whole milk. Beverages: Juice. Sugar-sweetened soft drinks. Beer. Liquor and spirits. Fats and oils: Butter. Canola oil. Vegetable oil. Beef fat (tallow). Lard. Sweets and desserts: Cookies. Cakes. Pies. Candy. Seasoning and other foods: Mayonnaise. Premade sauces and marinades. The items listed may not be a complete list. Talk with your dietitian about what dietary choices are right for you. Summary The Mediterranean diet includes both food and lifestyle choices. Eat a variety of fresh fruits and vegetables, beans, nuts, seeds, and whole grains. Limit the amount of red meat and sweets that you eat. Talk with your health care provider about whether it is safe for you to drink red wine in moderation. This means 1 glass a day for nonpregnant women and 2 glasses a day for men. A glass of wine equals 5 oz (150 mL). This information is not intended to replace advice given to you by your  health care provider. Make sure you discuss any questions you have with your health care provider. Document Released: 12/29/2015 Document Revised: 01/31/2016 Document Reviewed: 12/29/2015 Elsevier Interactive Patient Education  2017 [employer].

## 2021-07-21 ENCOUNTER — Ambulatory Visit (INDEPENDENT_AMBULATORY_CARE_PROVIDER_SITE_OTHER): Payer: Medicare HMO

## 2021-07-21 ENCOUNTER — Ambulatory Visit: Payer: Self-pay

## 2021-07-21 DIAGNOSIS — J4550 Severe persistent asthma, uncomplicated: Secondary | ICD-10-CM | POA: Diagnosis not present

## 2021-07-24 ENCOUNTER — Encounter (HOSPITAL_COMMUNITY): Payer: Self-pay

## 2021-07-24 ENCOUNTER — Emergency Department (HOSPITAL_COMMUNITY): Payer: Medicare HMO

## 2021-07-24 ENCOUNTER — Emergency Department (HOSPITAL_COMMUNITY)
Admission: EM | Admit: 2021-07-24 | Discharge: 2021-07-24 | Disposition: A | Discharge status: Home / Self Care | Payer: Medicare HMO | Attending: Emergency Medicine | Admitting: Emergency Medicine

## 2021-07-24 ENCOUNTER — Other Ambulatory Visit: Payer: Self-pay

## 2021-07-24 DIAGNOSIS — R5383 Other fatigue: Secondary | ICD-10-CM | POA: Diagnosis not present

## 2021-07-24 DIAGNOSIS — H5509 Other forms of nystagmus: Secondary | ICD-10-CM | POA: Insufficient documentation

## 2021-07-24 DIAGNOSIS — Z7951 Long term (current) use of inhaled steroids: Secondary | ICD-10-CM | POA: Diagnosis not present

## 2021-07-24 DIAGNOSIS — J45909 Unspecified asthma, uncomplicated: Secondary | ICD-10-CM | POA: Diagnosis not present

## 2021-07-24 DIAGNOSIS — Z91010 Allergy to peanuts: Secondary | ICD-10-CM | POA: Insufficient documentation

## 2021-07-24 DIAGNOSIS — Z79899 Other long term (current) drug therapy: Secondary | ICD-10-CM | POA: Diagnosis not present

## 2021-07-24 DIAGNOSIS — C3490 Malignant neoplasm of unspecified part of unspecified bronchus or lung: Secondary | ICD-10-CM | POA: Diagnosis not present

## 2021-07-24 DIAGNOSIS — R42 Dizziness and giddiness: Secondary | ICD-10-CM | POA: Insufficient documentation

## 2021-07-24 DIAGNOSIS — Z85118 Personal history of other malignant neoplasm of bronchus and lung: Secondary | ICD-10-CM | POA: Insufficient documentation

## 2021-07-24 DIAGNOSIS — I10 Essential (primary) hypertension: Secondary | ICD-10-CM | POA: Diagnosis not present

## 2021-07-24 DIAGNOSIS — Z20822 Contact with and (suspected) exposure to covid-19: Secondary | ICD-10-CM | POA: Diagnosis not present

## 2021-07-24 LAB — URINALYSIS, ROUTINE W REFLEX MICROSCOPIC
Bilirubin Urine: NEGATIVE
Glucose, UA: NEGATIVE mg/dL
Hgb urine dipstick: NEGATIVE
Ketones, ur: NEGATIVE mg/dL
Nitrite: NEGATIVE
Protein, ur: NEGATIVE mg/dL
Specific Gravity, Urine: 1.002 — ABNORMAL LOW (ref 1.005–1.030)
pH: 7 (ref 5.0–8.0)

## 2021-07-24 LAB — CBC WITH DIFFERENTIAL/PLATELET
Abs Immature Granulocytes: 0.01 10*3/uL (ref 0.00–0.07)
Basophils Absolute: 0 10*3/uL (ref 0.0–0.1)
Basophils Relative: 1 %
Eosinophils Absolute: 0 10*3/uL (ref 0.0–0.5)
Eosinophils Relative: 0 %
HCT: 40 % (ref 36.0–46.0)
Hemoglobin: 13.6 g/dL (ref 12.0–15.0)
Immature Granulocytes: 0 %
Lymphocytes Relative: 21 %
Lymphs Abs: 1 10*3/uL (ref 0.7–4.0)
MCH: 30.4 pg (ref 26.0–34.0)
MCHC: 34 g/dL (ref 30.0–36.0)
MCV: 89.5 fL (ref 80.0–100.0)
Monocytes Absolute: 0.4 10*3/uL (ref 0.1–1.0)
Monocytes Relative: 10 %
Neutro Abs: 3.2 10*3/uL (ref 1.7–7.7)
Neutrophils Relative %: 68 %
Platelets: 205 10*3/uL (ref 150–400)
RBC: 4.47 MIL/uL (ref 3.87–5.11)
RDW: 14 % (ref 11.5–15.5)
WBC: 4.6 10*3/uL (ref 4.0–10.5)
nRBC: 0 % (ref 0.0–0.2)

## 2021-07-24 LAB — COMPREHENSIVE METABOLIC PANEL
ALT: 14 U/L (ref 0–44)
AST: 23 U/L (ref 15–41)
Albumin: 3.6 g/dL (ref 3.5–5.0)
Alkaline Phosphatase: 78 U/L (ref 38–126)
Anion gap: 7 (ref 5–15)
BUN: 7 mg/dL — ABNORMAL LOW (ref 8–23)
CO2: 25 mmol/L (ref 22–32)
Calcium: 9 mg/dL (ref 8.9–10.3)
Chloride: 104 mmol/L (ref 98–111)
Creatinine, Ser: 0.79 mg/dL (ref 0.44–1.00)
GFR, Estimated: >60 mL/min (ref >60)
Glucose, Bld: 99 mg/dL (ref 70–99)
Potassium: 3.5 mmol/L (ref 3.5–5.1)
Sodium: 136 mmol/L (ref 135–145)
Total Bilirubin: 0.5 mg/dL (ref 0.3–1.2)
Total Protein: 7 g/dL (ref 6.5–8.1)

## 2021-07-24 LAB — RESP PANEL BY RT-PCR (FLU A&B, COVID) ARPGX2
Influenza A by PCR: NEGATIVE
Influenza B by PCR: NEGATIVE
SARS Coronavirus 2 by RT PCR: NEGATIVE

## 2021-07-24 LAB — MAGNESIUM: Magnesium: 2 mg/dL (ref 1.7–2.4)

## 2021-07-24 IMAGING — CT CT HEAD W/O CM
3 series · 16 of 47 positions shown, 19 images · non-contrast
Comparison: Head CT [DATE].  Recent brain MRI [DATE].

CLINICAL DATA: 80-year-old female with dizziness since yesterday.
Treated lung cancer.



[Series 2: head wo · axial · 0.42mm/px · z∈[-168,-33]mm · 10 of 33 slices shown, 13 images]
[im 3/33  brain]
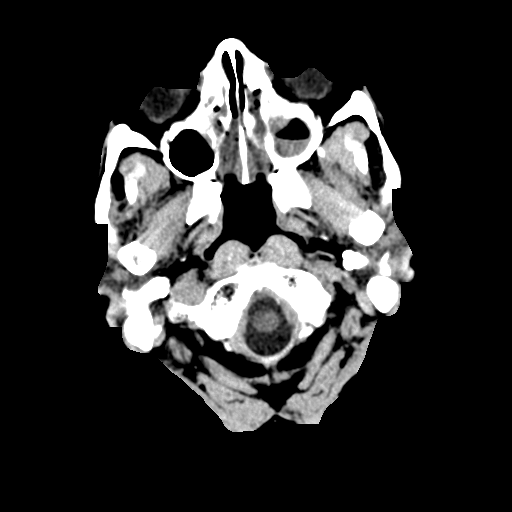
[im 3/33  bone]
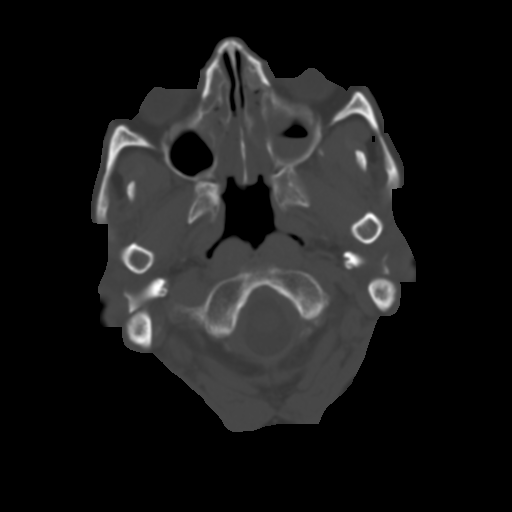
[im 6/33  brain]
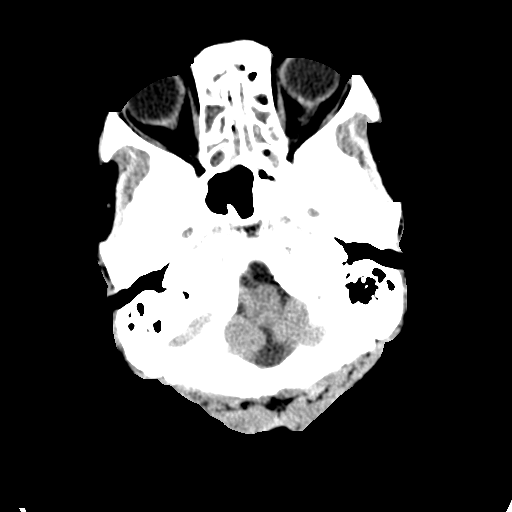
[im 9/33  brain]
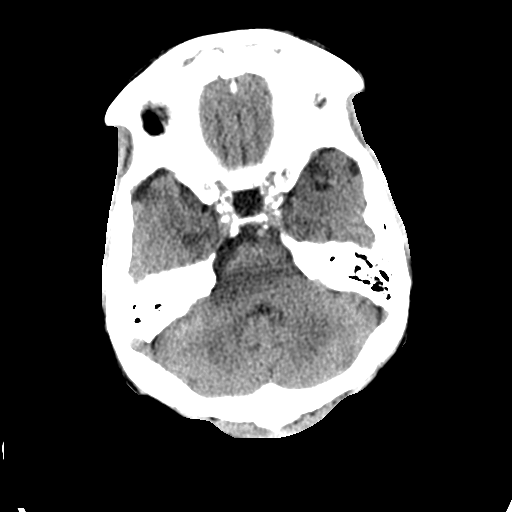
[im 12/33  brain]
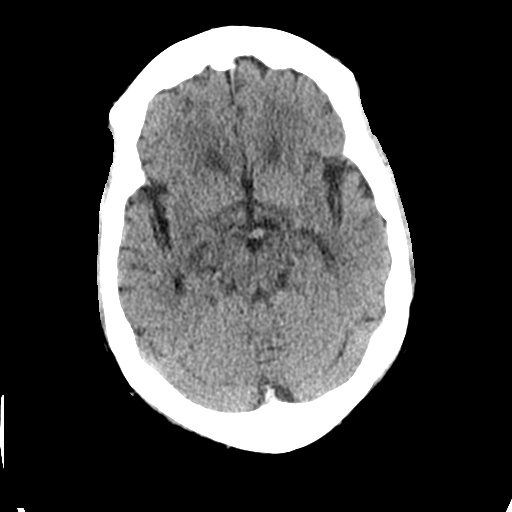
[im 15/33  brain]
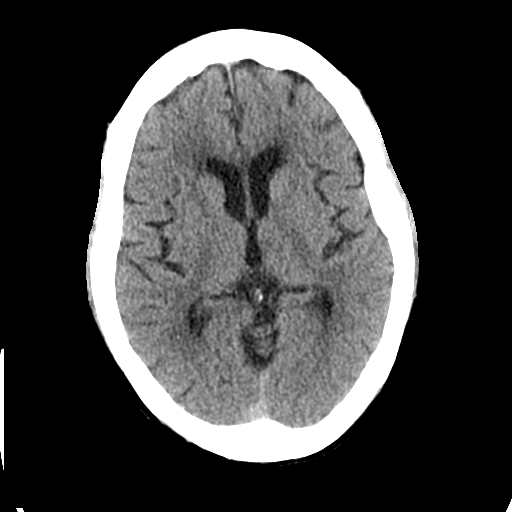
[im 15/33  bone]
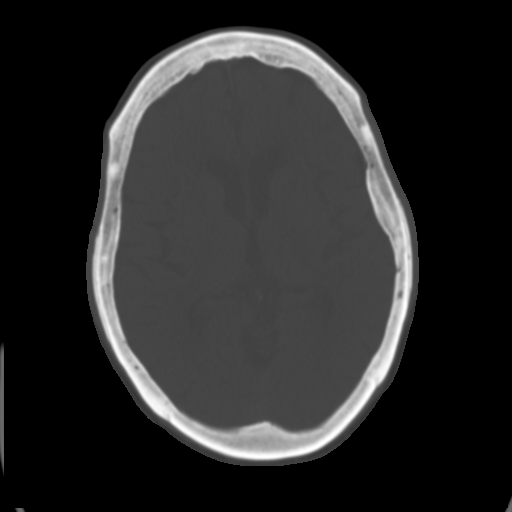
[im 18/33  brain]
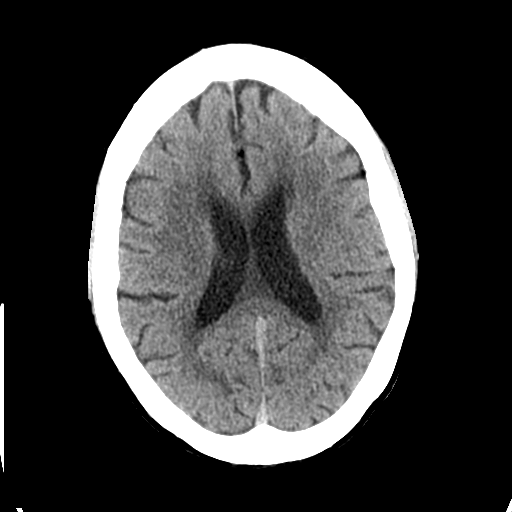
[im 21/33  brain]
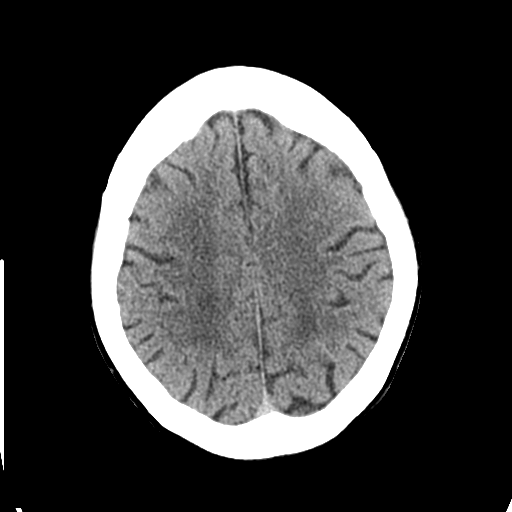
[im 25/33  brain]
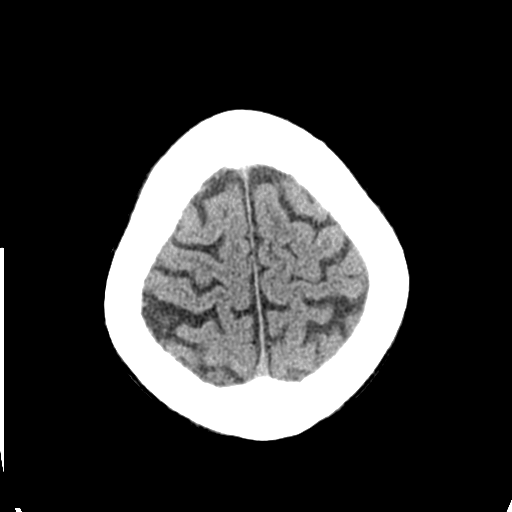
[im 27/33  brain]
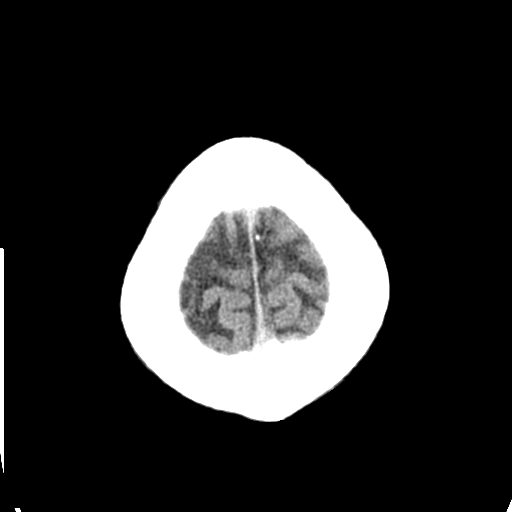
[im 27/33  bone]
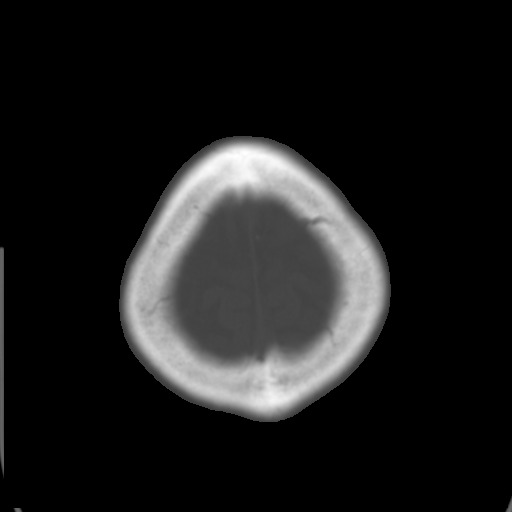
[im 30/33  brain]
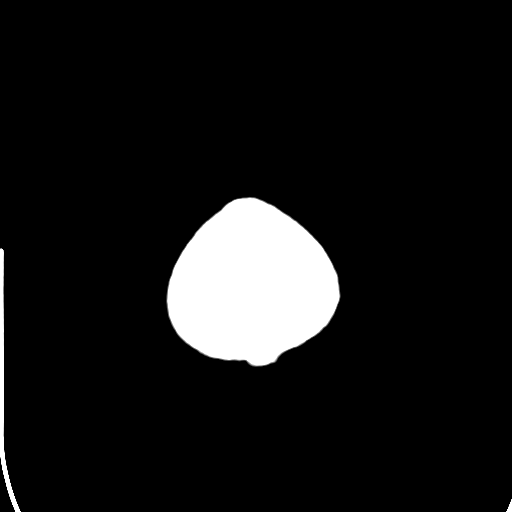

[Series 5: coronal soft tissue · coronal · 0.33mm/px · 3 of 71 slices shown]
[im 24/71  brain]
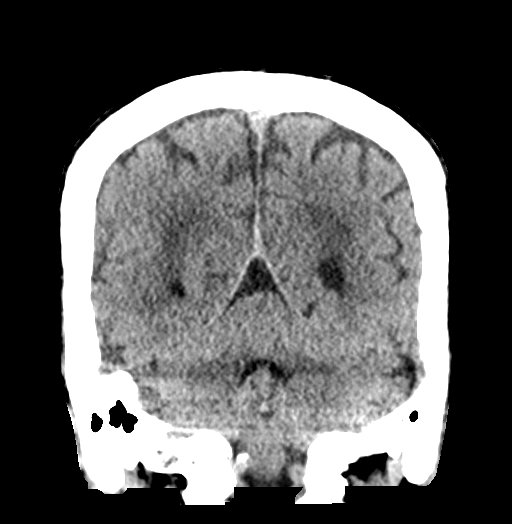
[im 32/71  brain]
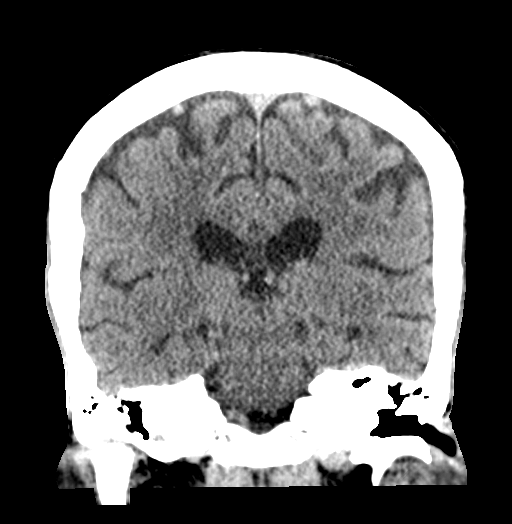
[im 39/71  brain]
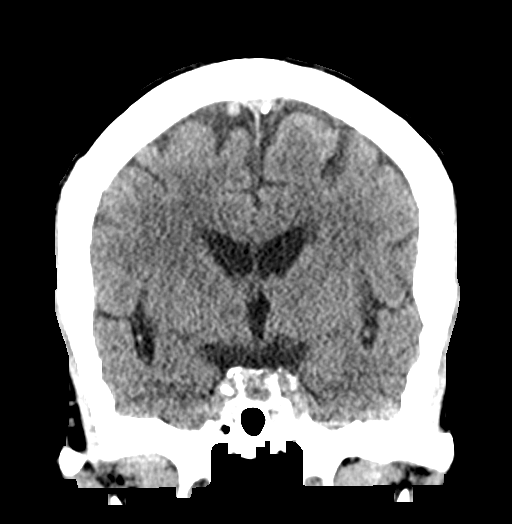

[Series 6: sagittal soft tissue · sagittal · 0.34mm/px · 3 of 58 slices shown]
[im 20/58  brain]
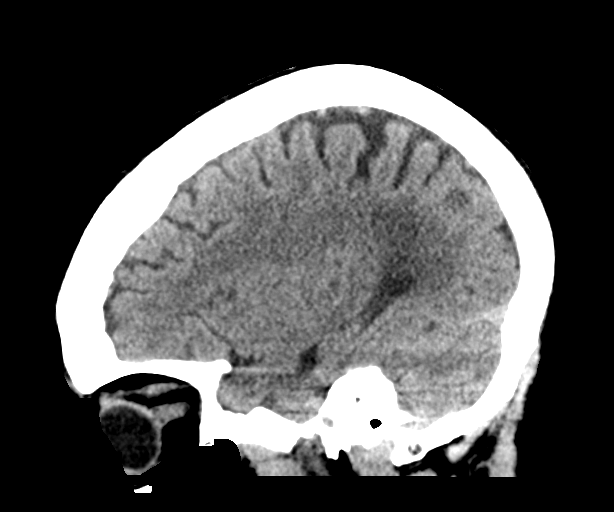
[im 29/58  brain]
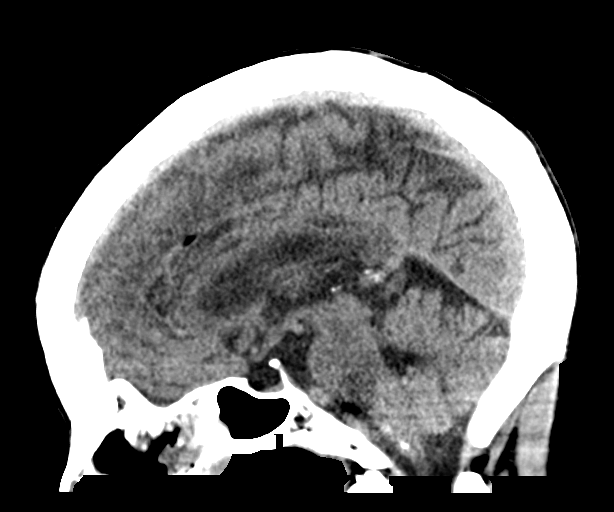
[im 39/58  brain]
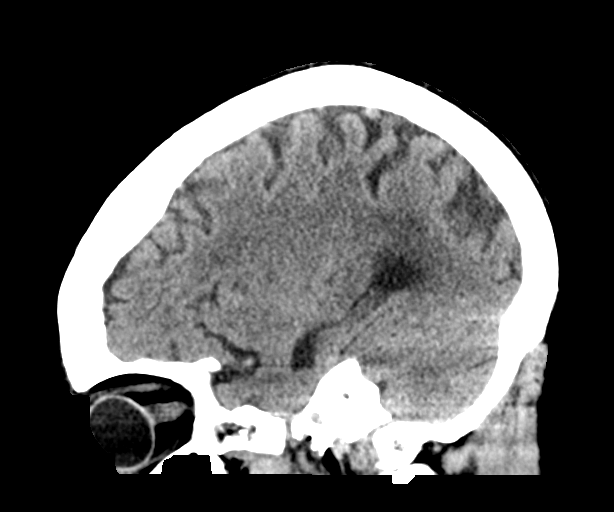

[16 of 47 positions shown; findings below may reference images not displayed]

FINDINGS: Brain: Stable cerebral volume. No midline shift, ventriculomegaly,
mass effect, evidence of mass lesion, intracranial hemorrhage or
evidence of cortically based acute infarction. Patchy bilateral
white matter hypodensity is stable. No cortical encephalomalacia
identified.

Vascular: Calcified atherosclerosis at the skull base. No suspicious
intracranial vascular hyperdensity.

Skull: No acute osseous abnormality identified.

Sinuses/Orbits: Ongoing widespread paranasal sinus opacification,
chronic but progressed from last year stable from the recent CT.
Tympanic cavities and mastoids remain well aerated.

Other: No acute orbit or scalp soft tissue finding.
IMPRESSION: 1. No acute intracranial abnormality. Stable non contrast CT
appearance of white matter disease.
2. Widespread paranasal sinus disease stable from last month.

## 2021-07-24 IMAGING — DX DG CHEST 1V PORT
1 series · 1 of 1 positions shown · non-contrast
Comparison: Restaging chest CT [DATE] and earlier.

CLINICAL DATA: 80-year-old female with dizziness. Lung cancer. Left
side pain.

EXAM:
PORTABLE CHEST 1 VIEW

[chest ap]
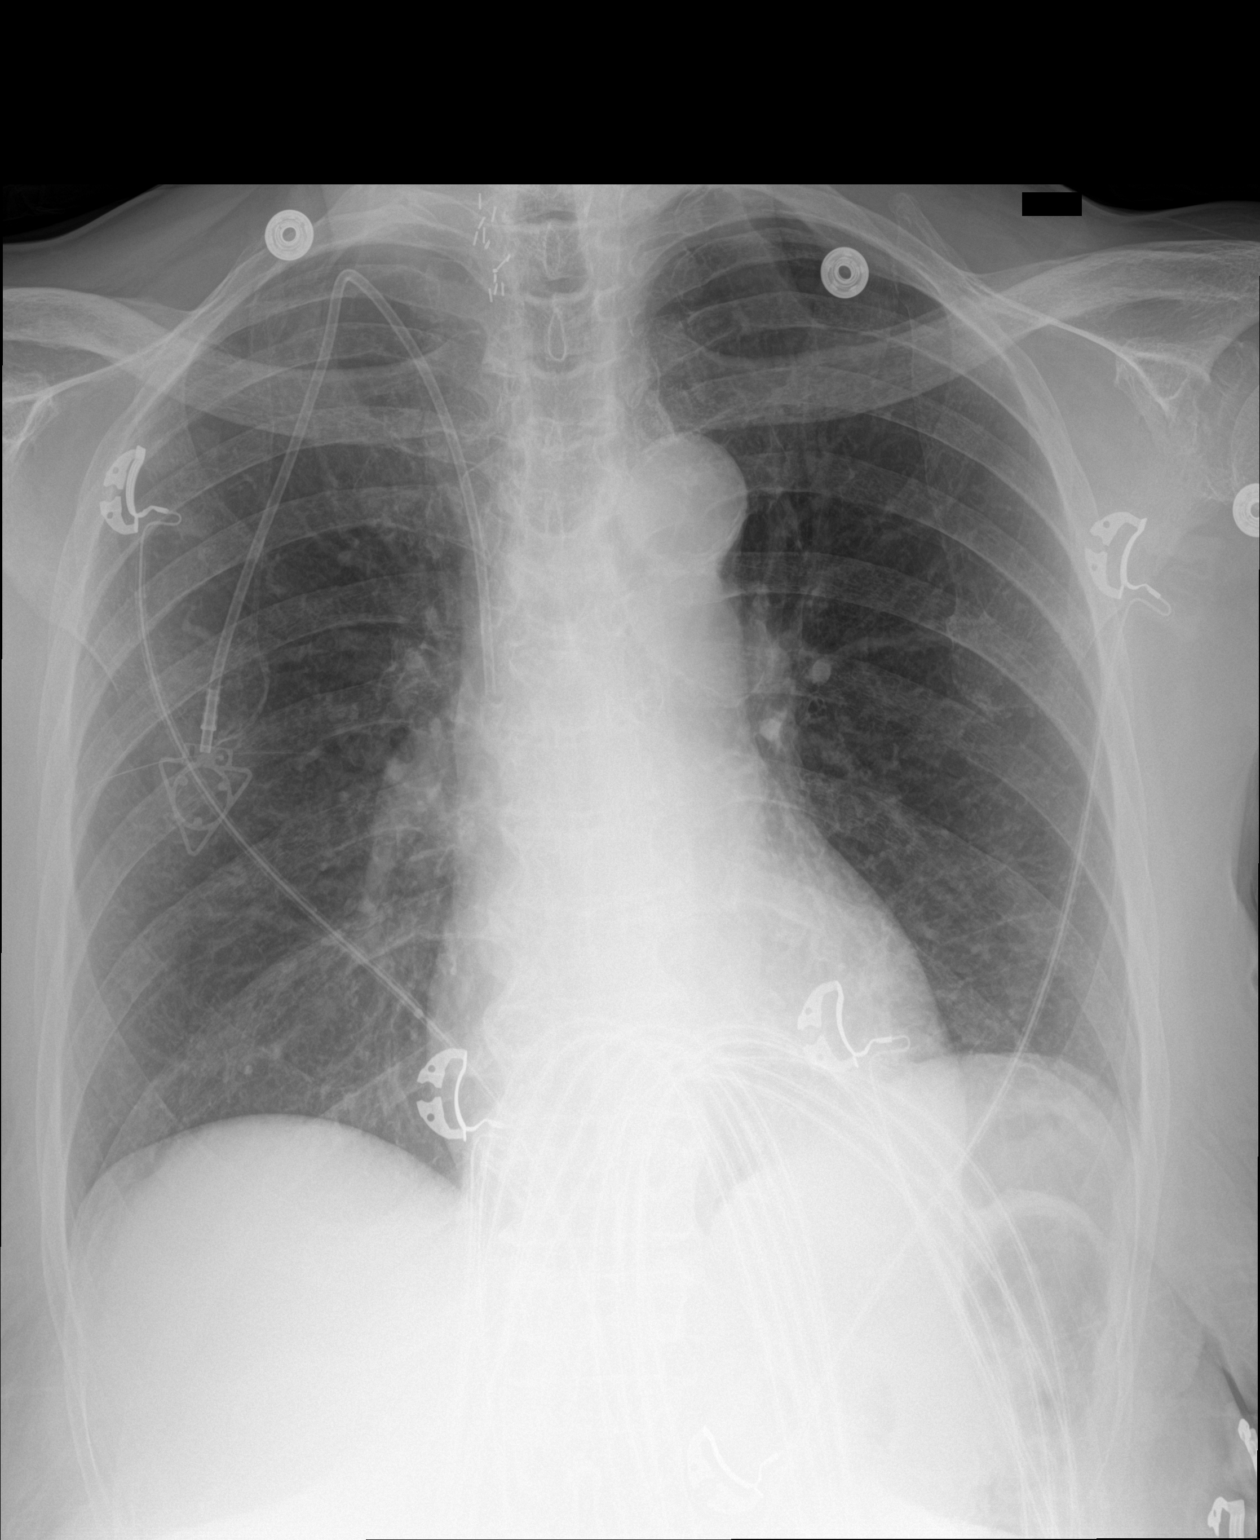

[1 of 1 positions shown; findings below may reference images not displayed]

FINDINGS: Portable AP upright view at Stable right chest power port. Abnormal
left lower lobe opacity by thought to reflect radiation pneumonitis
CT is occult by portable chest. Lower lung volumes compared to
radiographs last year. Stable cardiac size and mediastinal contours.
No pneumothorax, pulmonary edema or pleural effusion. Previous
thyroid surgery. Negative visible bowel gas. No acute osseous
abnormality identified.
IMPRESSION: No acute cardiopulmonary abnormality identified.

## 2021-07-24 MED ORDER — MECLIZINE HCL 25 MG PO TABS
25.0000 mg | ORAL_TABLET | Freq: Once | ORAL | Status: AC
  Administered 2021-07-24: 25 mg via ORAL
  Filled 2021-07-24: qty 1

## 2021-07-24 MED ORDER — SODIUM CHLORIDE 0.9 % IV BOLUS
1000.0000 mL | Freq: Once | INTRAVENOUS | Status: AC
  Administered 2021-07-24: 1000 mL via INTRAVENOUS

## 2021-07-24 MED ORDER — HEPARIN SOD (PORK) LOCK FLUSH 100 UNIT/ML IV SOLN
INTRAVENOUS | Status: AC
  Filled 2021-07-24: qty 5

## 2021-07-24 MED ORDER — MECLIZINE HCL 25 MG PO TABS: 25.0000 mg | ORAL_TABLET | Freq: Two times a day (BID) | ORAL | 0 refills | Status: DC | PRN

## 2021-07-24 NOTE — ED Notes (Signed)
Patient transported to CT 

## 2021-07-24 NOTE — ED Triage Notes (Signed)
Pt reports with dizziness since yesterday. Pt states that she has a hx of lung cancer and her left side is hurting her.  ?

## 2021-07-24 NOTE — Discharge Instructions (Signed)
It was a pleasure caring for you today in the emergency department. ° °Please return to the emergency department for any worsening or worrisome symptoms. ° ° °

## 2021-07-24 NOTE — ED Provider Notes (Signed)
[place] DEPT Provider Note   CSN: [id number] Arrival date & time: 07/24/21  0530     History  Chief Complaint  Patient presents with   Dizziness    Angelica [name] is a 81 y.o. female.  This is a 81 y.o. female with significant medical history as below, including lung adenocarcinoma, acid reflux, hypertension, dementia who presents to the ED with complaint of dizziness.  Patient has had this problem multiple times in the past.  Provoked by standing up or moving her head quickly, sensation of room spinning.  Improved with sitting down and resting.  Not associate with chest pain, dyspnea, diaphoresis, near syncope, abdominal pain, nausea or vomiting.  Family reports medications recently adjusted including adding memantine, Synthroid.  No numbness or tingling, no headaches or neck pain.  No falls.  No change in bowel or bladder function.   Past Medical History: No date: Arthritis No date: Asthma No date: Bowel obstruction ([place]) No date: Cancer ([place]) No date: Environmental allergies No date: GERD (gastroesophageal reflux disease) 05/09/2017: Glaucoma No date: H. pylori infection 07/05/20-08/05/20: History of radiation therapy     Comment:  Left lung IMRT Dr. Sondra Come  01/16/2019: HLD (hyperlipidemia) No date: Hypertension No date: Iron deficiency anemia 05/26/2017: Osteopenia No date: Thyroid disease 07/25/2018: Urticaria  Past Surgical History: No date: ABDOMINAL HYSTERECTOMY 05/23/2020: BOWEL RESECTION; N/A     Comment:  Procedure: SMALL BOWEL REPAIR;  Surgeon: Kinsinger, Arta Bruce, MD;  Location: [facility];  Service: General;                Laterality: N/A; No date: BREAST BIOPSY 06/10/2020: BRONCHIAL BIOPSY     Comment:  Procedure: BRONCHIAL BIOPSIES;  Surgeon: Garner Nash, DO;  Location: [facility];  Service: Pulmonary;; 06/10/2020: BRONCHIAL BRUSHINGS     Comment:  Procedure: BRONCHIAL BRUSHINGS;  Surgeon: Garner Nash, DO;  Location: [place];  Service: Pulmonary;; 06/10/2020: BRONCHIAL NEEDLE ASPIRATION BIOPSY     Comment:  Procedure: BRONCHIAL NEEDLE ASPIRATION BIOPSIES;                Surgeon: Garner Nash, DO;  Location: [facility];                Service: Pulmonary;; 06/10/2020: BRONCHIAL WASHINGS     Comment:  Procedure: BRONCHIAL WASHINGS;  Surgeon: Garner Nash, DO;  Location: [facility];  Service: Pulmonary;; No date: COLON SURGERY 05/23/2020: DIAGNOSTIC LARYNGOSCOPY; N/A     Comment:  Procedure: ATTEMPTED DIAGNOSTIC LAPAROSCOPY WITH               ADHESIONS;  Surgeon: Kinsinger, Arta Bruce, MD;                Location: [facility];  Service: General;  Laterality: N/A; 07/15/2020: IR IMAGING GUIDED PORT INSERTION No date: KNEE SURGERY 04/18/2015: LAPAROTOMY; N/A     Comment:  Procedure: Exploratory laparotomy with lysis of               adhesions, possible bowel resection;  Surgeon: Ralene Ok, MD;  Location: [facility];  Service: General;  Laterality: N/A; 05/23/2020: LAPAROTOMY; N/A     Comment:  Procedure: EXPLORATORY LAPAROTOMY;  Surgeon: Kinsinger,               Arta Bruce, MD;  Location: [place] [facility];  Service: General;                Laterality: N/A; 05/23/2020: LYSIS OF ADHESION; N/A     Comment:  Procedure: LYSIS OF ADHESION;  Surgeon: Kinsinger, Arta Bruce, MD;  Location: [facility];  Service: General;                Laterality: N/A; No date: NASAL SINUS SURGERY No date: THYROID SURGERY 06/10/2020: VIDEO BRONCHOSCOPY WITH ENDOBRONCHIAL NAVIGATION; Bilateral     Comment:  Procedure: VIDEO BRONCHOSCOPY WITH ENDOBRONCHIAL               NAVIGATION;  Surgeon: Garner Nash, DO;  Location: [place] [facility];  Service: Pulmonary;  Laterality: Bilateral; 06/10/2020: VIDEO BRONCHOSCOPY WITH ENDOBRONCHIAL ULTRASOUND     Comment:  Procedure: VIDEO BRONCHOSCOPY WITH ENDOBRONCHIAL               ULTRASOUND;   Surgeon: Garner Nash, DO;  Location: [facility]              ENDOSCOPY;  Service: Pulmonary;;    The history is provided by the patient and a relative. No language interpreter was used.  Dizziness Associated symptoms: no chest pain, no headaches, no nausea, no palpitations, no shortness of breath and no vomiting       Home Medications Prior to Admission medications   Medication Sig Start Date End Date Taking? Authorizing Provider  acetaminophen (TYLENOL) 325 MG tablet Take 1 tablet (325 mg total) by mouth every 6 (six) hours as needed for headache or mild pain. 10/22/20   Hongalgi, Lenis Dickinson, MD  alum & mag hydroxide-simeth (MAALOX/MYLANTA) 200-200-20 MG/5ML suspension Take 15 mLs by mouth every 6 (six) hours as needed for indigestion or heartburn.    [provider]  amLODipine (NORVASC) 10 MG tablet Take 1 tablet (10 mg total) by mouth daily. 06/09/21   [name], MD  Benralizumab [facility]) 30 MG/ML SOSY Inject 1 mL (30 mg total) into the skin every 8 (eight) weeks. 05/23/21   Valentina Shaggy, MD  budesonide-formoterol [facility]) 80-4.5 MCG/ACT inhaler Inhale 2 puffs into the lungs 2 (two) times daily. 04/26/21   Kennith Gain, MD  Cholecalciferol (VITAMIN D3) 50 MCG (2000 UT) TABS Take 2,000 Units by mouth daily.    [provider]  clopidogrel (PLAVIX) 75 MG tablet Take 1 tablet (75 mg total) by mouth daily. 07/20/21   [name], MD  diclofenac Sodium (VOLTAREN) 1 % GEL Apply 4 g topically 4 (four) times daily. 01/31/21   Deno Etienne, DO  EPINEPHrine (EPIPEN 2-PAK) 0.3 mg/0.3 mL IJ SOAJ injection Use as directed for severe allergic reaction Patient not taking: Reported on 07/20/2021 01/28/20   Kennith Gain, MD  ipratropium (ATROVENT) 0.06 % nasal spray Place 2 sprays into both nostrils 4 (four) times daily as needed (nasal drainage). 04/21/21   Kennith Gain, MD  levothyroxine (SYNTHROID) 50 MCG tablet Take 1 tablet (50 mcg total) by mouth  daily before breakfast. 05/16/21   Curt Bears, MD  linaclotide [facility]) 72 MCG capsule TAKE 1 CAPSULE BY MOUTH ONCE DAILY  AS NEEDED 06/14/20   [name], MD  loratadine (CLARITIN) 10 MG tablet Take 10 mg by mouth daily.    [provider]  Magnesium Hydroxide (PHILLIPS MILK OF MAGNESIA PO) Take 15-30 mLs by mouth daily as needed (for constipation).    [provider]  memantine (NAMENDA) 5 MG tablet Take 1 tablet (5 mg at night) for 2 weeks, then increase to 1 tablet (5mg ) twice a day 07/20/21   Rondel Jumbo, PA-C  montelukast (SINGULAIR) 10 MG tablet TAKE 1 TABLET BY MOUTH AT BEDTIME 06/23/21   Kennith Gain, MD  ondansetron (ZOFRAN) 8 MG tablet Take 1 tablet (8 mg total) by mouth every 8 (eight) hours as needed for nausea or vomiting. 11/02/20   Curt Bears, MD  polyethylene glycol (MIRALAX / GLYCOLAX) 17 g packet Take 17 g by mouth daily as needed for mild constipation.    [provider]  pravastatin (PRAVACHOL) 80 MG tablet Take 1 tablet by mouth once daily 06/23/21   [name], MD  vitamin B-12 (CYANOCOBALAMIN) 1000 MCG tablet Take 1,000 mcg by mouth daily.    [provider]      Allergies    Asa buff (mag [buffered aspirin], Ensure [nutritional supplements], Fish allergy, Other, Peanut-containing drug products, Penicillins, Shellfish-derived products, and Strawberry extract    Review of Systems   Review of Systems  Constitutional:  Negative for chills and fever.  HENT:  Negative for facial swelling and trouble swallowing.   Eyes:  Negative for photophobia and visual disturbance.  Respiratory:  Negative for cough and shortness of breath.   Cardiovascular:  Negative for chest pain and palpitations.  Gastrointestinal:  Negative for abdominal pain, nausea and vomiting.  Endocrine: Negative for polydipsia and polyuria.  Genitourinary:  Negative for difficulty urinating and hematuria.  Musculoskeletal:  Negative for gait  problem and joint swelling.  Skin:  Negative for pallor and rash.  Neurological:  Positive for dizziness. Negative for syncope and headaches.  Psychiatric/Behavioral:  Negative for agitation and confusion.    Physical Exam Updated Vital Signs BP (!) 143/97    Pulse 70    Temp 97.9 F (36.6 C) (Oral)    Resp 14    SpO2 100%  Physical Exam Vitals and nursing note reviewed.  Constitutional:      General: She is not in acute distress.    Appearance: Normal appearance. She is not ill-appearing.  HENT:     Head: Normocephalic and atraumatic.     Right Ear: External ear normal.     Left Ear: External ear normal.     Nose: Nose normal.     Mouth/Throat:     Mouth: Mucous membranes are moist.  Eyes:     General: No scleral icterus.       Right eye: No discharge.        Left eye: No discharge.     Extraocular Movements: Extraocular movements intact.     Pupils: Pupils are equal, round, and reactive to light.     Comments: Fatigable horizontal nystagmus  Cardiovascular:     Rate and Rhythm: Normal rate and regular rhythm.     Pulses: Normal pulses.     Heart sounds: Normal heart sounds.  Pulmonary:     Effort: Pulmonary effort is normal. No respiratory distress.     Breath sounds: Normal breath sounds.  Abdominal:     General: Abdomen is flat.     Tenderness: There is no abdominal tenderness.  Musculoskeletal:        General: Normal range of motion.     Cervical back: Normal range of motion.     Right lower leg: No edema.     Left lower leg: No edema.  Skin:    General: Skin is warm and dry.     Capillary Refill: Capillary refill takes less than 2 seconds.  Neurological:     Mental Status: She is alert and oriented to person, place, and time.     GCS: GCS eye subscore is 4. GCS verbal subscore is 5. GCS motor subscore is 6.     Cranial Nerves: Cranial nerves 2-12 are intact. No dysarthria or facial asymmetry.     Sensory: Sensation is intact.     Motor: Motor function is  intact. No tremor or pronator drift.     Coordination: Coordination is intact. Finger-Nose-Finger Test normal.     Comments: HINTS exam is consistent with peripheral vertigo  Psychiatric:        Mood and Affect: Mood normal.        Behavior: Behavior normal.    ED Results / Procedures / Treatments   Labs (all labs ordered are listed, but only abnormal results are displayed) Labs Reviewed  RESP PANEL BY RT-PCR (FLU A&B, COVID) ARPGX2  CBC WITH DIFFERENTIAL/PLATELET  COMPREHENSIVE METABOLIC PANEL  MAGNESIUM  URINALYSIS, ROUTINE W REFLEX MICROSCOPIC    EKG EKG Interpretation  Date/Time:  Monday July 24 2021 05:55:35 EST Ventricular Rate:  71 PR Interval:  162 QRS Duration: 85 QT Interval:  420 QTC Calculation: 457 R Axis:   -28 Text Interpretation: Sinus rhythm Probable left atrial enlargement Borderline left axis deviation Confirmed by Wynona Dove ([id number]) on 07/24/2021 6:16:16 AM  Radiology No results found.  Procedures Procedures    Medications Ordered in ED Medications  sodium chloride 0.9 % bolus 1,000 mL (has no administration in time range)  meclizine (ANTIVERT) tablet 25 mg (has no administration in time range)    ED Course/ Medical Decision Making/ A&P Clinical Course as of 07/25/21 2101  Mon Jul 24, 2021  0642 Patient has no chest pain, no dib.  [SG]    Clinical Course User Index [SG] Jeanell Sparrow, DO                           Medical Decision Making Amount and/or Complexity of Data Reviewed Labs: ordered. Radiology: ordered.    CC: dizzy  This patient presents to the Emergency Department for the above complaint. This involves an extensive number of treatment options and is a complaint that carries with it a high risk of complications and morbidity. Vital signs were reviewed. Serious etiologies considered.  HINTS exam c/w peripheral vertigo. Neuro exam is entirely nonfocal  Record review:  Previous records obtained and reviewed   Additional  history obtained from daughter  Medical and surgical history as noted above.   Work up as above, notable for:  Labs & imaging results that were available during my care of the patient were visualized by me and considered in my medical decision making.   Given dizziness, history of adenocarcinoma of the lung, nonfocal neuro exam.  I will obtain CT head to evaluate pos metastatic disease process.  I ordered imaging studies which included CTH, CXR and I visualized the imaging and I agree with radiologist interpretation. No acute process  Cardiac monitoring reviewed and interpreted personally which shows NSR  Social determinants of  health include - N/a  Management: IV fluids, meclizine  Reassessment:  Pt is feeling better. She is pending remainder of her lab workup and UA prior to re-assess and dispo. Signed out to incoming EDP. If labs unremarkable and continues to feel stable/imprved would favor dc.        This chart was dictated using voice recognition software.  Despite best efforts to proofread,  errors can occur which can change the documentation meaning.         Final Clinical Impression(s) / ED Diagnoses Final diagnoses:  Dizziness    Rx / DC Orders ED Discharge Orders     None         Jeanell Sparrow, DO 07/25/21 2102

## 2021-07-24 NOTE — ED Provider Notes (Signed)
Seen after prior EDP ? ?Patient is comfortable.  She feels improved.  She desires DC home. ? ?Screening labs obtained are without significant abnormality. ? ?Patient without symptoms of UTI. ? ?Urine will be sent for urine culture.  Patient is aware of this fact. ? ?Importance of close follow-up is stressed.  Strict return precautions given and understood. ?  ?Valarie Merino, MD ?07/24/21 [phone] ? ?

## 2021-07-25 LAB — URINE CULTURE: Culture: <10000 — AB

## 2021-08-02 ENCOUNTER — Ambulatory Visit: Payer: Medicare HMO | Admitting: Internal Medicine

## 2021-08-07 ENCOUNTER — Encounter: Payer: Self-pay | Admitting: Neurology

## 2021-08-07 ENCOUNTER — Ambulatory Visit: Payer: Medicare HMO | Admitting: Neurology

## 2021-08-07 VITALS — BP 156/82 | HR 82 | Ht 65.0 in | Wt 155.6 lb

## 2021-08-07 DIAGNOSIS — R930 Abnormal findings on diagnostic imaging of skull and head, not elsewhere classified: Secondary | ICD-10-CM | POA: Diagnosis not present

## 2021-08-07 DIAGNOSIS — F02A0 Dementia in other diseases classified elsewhere, mild, without behavioral disturbance, psychotic disturbance, mood disturbance, and anxiety: Secondary | ICD-10-CM | POA: Diagnosis not present

## 2021-08-07 DIAGNOSIS — G301 Alzheimer's disease with late onset: Secondary | ICD-10-CM

## 2021-08-07 DIAGNOSIS — R413 Other amnesia: Secondary | ICD-10-CM

## 2021-08-07 DIAGNOSIS — I6990 Unspecified sequelae of unspecified cerebrovascular disease: Secondary | ICD-10-CM | POA: Diagnosis not present

## 2021-08-07 MED ORDER — MEMANTINE HCL 10 MG PO TABS: 10.0000 mg | ORAL_TABLET | Freq: Two times a day (BID) | ORAL | 5 refills | Status: DC

## 2021-08-07 NOTE — Progress Notes (Addendum)
?[facility] ?[address] ?[place]. [place] [address] ?[phone] ? ?     OFFICE CONSULT NOTE ? ?[name] ?Date of Birth:  Oct 19, 1940 ?Medical Record Number:  [id number]  ? ?Referring MD:  Cathlean Cower ? ?Reason for Referral:  Stroke and memory loss ? ?HPI: Angelica [name] is a pleasant 81 year old African-American lady with initial office consultation visit.  She is accompanied by 2 of her daughters.  History is obtained from them and review of electronic medical records and I have personally reviewed pertinent available imaging films in PACS.  She is a 81 year old African-American lady with past medical history of known lung cancer s/p chemotherapy, radiation.  Acid reflux, hypertension and memory loss.  Patient has had for the last 6 to 9 months progressive decline in memory and cognition.  Her short-term memory is quite poor though long-term he is fine.  He needs constant reminders and repetition for anything-year-old.  She does not do well with change.  She lives at home with her daughter and is mostly independent in all activities of daily living.  Her daughter is not in charge of her primary and arranging her medicines.  She has not exhibited any unsafe behaviors except a while back she left the faucet on and with the whole bathroom.  She denies any, delusions, hallucinations, agitation.  She does not wander off.  She has no prior history of significant head injury, loss of consciousness, seizures, stroke or TIA.  She and her daughter specifically denies any history of slurred speech, aphasia, vertigo, diplopia, focal extremity weakness, gait imbalance.  She had an MRI scan of the brain done on 07/18/2021 which I personally reviewed and shows only mild changes of small vessel disease and generalized atrophy.  I do not see a definite right cerebellar stroke but the radiologist has called 1 which apparently was not seen on previous MRI from 06/14/2020.  Patient has since been started on  Plavix by Dr. Jenny Reichmann and is tolerating it well without bruising or bleeding.  Blood pressure is usually better controlled today it is elevated at 156/82.  Lab work on 07/18/2021 showed LDL cholesterol 97 mg percent, hemoglobin A1c 5.8.  Vitamin B12, vitamin D and free T4 levels are all normal.  She has not yet had an EEG.  She was started on Namenda starter pack presently taking 5 mg once a day.  Daughters are concerned about her gastric reflux and did not want to try Aricept.  On Mini-Mental status exam today she scored 18/30 and clock drawing 3/4.  On 07/20/2021 she scored 11/30 on MoCA testing with our urology office ?Family history of Alzheimer's in her father. ?ROS:   ?14 system review of systems is positive for memory loss, confusion and all other systems negative ? ?PMH:  ?Past Medical History:  ?Diagnosis Date  ? Arthritis   ? Asthma   ? Bowel obstruction ([place])   ? Cancer [facility])   ? Environmental allergies   ? GERD (gastroesophageal reflux disease)   ? Glaucoma 05/09/2017  ? H. pylori infection   ? History of radiation therapy 07/05/20-08/05/20  ? Left lung IMRT Dr. Sondra Come   ? HLD (hyperlipidemia) 01/16/2019  ? Hypertension   ? Iron deficiency anemia   ? Osteopenia 05/26/2017  ? Thyroid disease   ? Urticaria 07/25/2018  ? ? ?Social History:  ?Social History  ? ?Socioeconomic History  ? Marital status: Widowed  ?  Spouse name: Not on file  ? Number of children:  4  ? Years of education: 47  ? Highest education level: Not on file  ?Occupational History  ? Occupation: retired  ?Tobacco Use  ? Smoking status: Former  ?  Types: Cigarettes  ? Smokeless tobacco: Never  ? Tobacco comments:  ?  quit 60 years ago  ?Vaping Use  ? Vaping Use: Never used  ?Substance and Sexual Activity  ? Alcohol use: No  ? Drug use: No  ? Sexual activity: Never  ?Other Topics Concern  ? Not on file  ?Social History Narrative  ? Right handed  ? Caffeine on occ  ? One story home  ? ?Social Determinants of Health  ? ?Financial Resource Strain: Low  Risk   ? Difficulty of Paying Living Expenses: Not hard at all  ?Food Insecurity: No Food Insecurity  ? Worried About Charity fundraiser in the Last Year: Never true  ? Ran Out of Food in the Last Year: Never true  ?Transportation Needs: No Transportation Needs  ? Lack of Transportation (Medical): No  ? Lack of Transportation (Non-Medical): No  ?Physical Activity: Inactive  ? Days of Exercise per Week: 0 days  ? Minutes of Exercise per Session: 30 min  ?Stress: No Stress Concern Present  ? Feeling of Stress : Not at all  ?Social Connections: Moderately Integrated  ? Frequency of Communication with Friends and Family: More than three times a week  ? Frequency of Social Gatherings with Friends and Family: Twice a week  ? Attends Religious Services: More than 4 times per year  ? Active Member of Clubs or Organizations: No  ? Attends Archivist Meetings: 1 to 4 times per year  ? Marital Status: Widowed  ?Intimate Partner Violence: Not At Risk  ? Fear of Current or Ex-Partner: No  ? Emotionally Abused: No  ? Physically Abused: No  ? Sexually Abused: No  ? ? ?Medications:   ?Current Outpatient Medications on File Prior to Visit  ?Medication Sig Dispense Refill  ? acetaminophen (TYLENOL) 325 MG tablet Take 1 tablet (325 mg total) by mouth every 6 (six) hours as needed for headache or mild pain.    ? alum & mag hydroxide-simeth (MAALOX/MYLANTA) 200-200-20 MG/5ML suspension Take 15 mLs by mouth every 6 (six) hours as needed for indigestion or heartburn.    ? amLODipine (NORVASC) 10 MG tablet Take 1 tablet (10 mg total) by mouth daily. 90 tablet 3  ? Benralizumab (FASENRA) 30 MG/ML SOSY Inject 1 mL (30 mg total) into the skin every 8 (eight) weeks. 1 mL 6  ? budesonide-formoterol (SYMBICORT) 80-4.5 MCG/ACT inhaler Inhale 2 puffs into the lungs 2 (two) times daily. 10.2 g 5  ? clopidogrel (PLAVIX) 75 MG tablet Take 1 tablet (75 mg total) by mouth daily. 90 tablet 3  ? diclofenac Sodium (VOLTAREN) 1 % GEL Apply 4 g  topically 4 (four) times daily. 100 g 0  ? EPINEPHrine (EPIPEN 2-PAK) 0.3 mg/0.3 mL IJ SOAJ injection Use as directed for severe allergic reaction 2 each 1  ? ipratropium (ATROVENT) 0.06 % nasal spray Place 2 sprays into both nostrils 4 (four) times daily as needed (nasal drainage). 15 mL 5  ? levothyroxine (SYNTHROID) 50 MCG tablet Take 1 tablet (50 mcg total) by mouth daily before breakfast. 30 tablet 2  ? linaclotide (LINZESS) 72 MCG capsule TAKE 1 CAPSULE BY MOUTH ONCE DAILY AS NEEDED 30 capsule 0  ? loratadine (CLARITIN) 10 MG tablet Take 10 mg by mouth daily.    ?  Magnesium Hydroxide (PHILLIPS MILK OF MAGNESIA PO) Take 15-30 mLs by mouth daily as needed (for constipation).    ? meclizine (ANTIVERT) 25 MG tablet Take 1 tablet (25 mg total) by mouth 2 (two) times daily as needed for dizziness. 10 tablet 0  ? memantine (NAMENDA) 5 MG tablet Take 1 tablet (5 mg at night) for 2 weeks, then increase to 1 tablet (5mg ) twice a day 60 tablet 11  ? montelukast (SINGULAIR) 10 MG tablet TAKE 1 TABLET BY MOUTH AT BEDTIME 30 tablet 4  ? ondansetron (ZOFRAN) 8 MG tablet Take 1 tablet (8 mg total) by mouth every 8 (eight) hours as needed for nausea or vomiting. 20 tablet 0  ? polyethylene glycol (MIRALAX / GLYCOLAX) 17 g packet Take 17 g by mouth daily as needed for mild constipation.    ? pravastatin (PRAVACHOL) 80 MG tablet Take 1 tablet by mouth once daily 90 tablet 3  ? vitamin B-12 (CYANOCOBALAMIN) 1000 MCG tablet Take 1,000 mcg by mouth daily.    ? ?Current Facility-Administered Medications on File Prior to Visit  ?Medication Dose Route Frequency Provider Last Rate Last Admin  ? Benralizumab SOSY 30 mg  30 mg Subcutaneous Q8 Weeks Kennith Gain, MD   30 mg at 07/21/21 0945  ? ? ?Allergies:   ?Allergies  ?Allergen Reactions  ? Asa Buff (Mag [Buffered Aspirin] Other (See Comments)  ?  Excessive sweating  ? Ensure [Nutritional Supplements]   ?  Or boost - upset stomach   ? Fish Allergy Other (See Comments)  ?   Unknown reaction, but allergic  ? Other Other (See Comments)  ?  Gelcaps; gel-containing capsules; extended-release medication - upset stomach   ? Peanut-Containing Drug Products Other (See Comments)  ?  F

## 2021-08-07 NOTE — Patient Instructions (Addendum)
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  I recommend  however she continue Plavix for stroke prevention and aggressive risk factor modification . Check  CT angiogram of.head and neck.  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  I agree with Namenda starter pack and if tolerated may increase to 10 mg twice daily.  Check EEG.  Patient was advised to increase participation in cognitively challenging activities like solving Puzzles, Playing Bridge and Sudoku.  We Also Discussed Memory Compensation Strategies.  She will return for follow-up in the future in 3 months or call earlier if necessary. ? ? ?Memory Compensation Strategies ? ?Use "WARM" strategy. ? W= write it down ? A= associate it ? R= repeat it ? M= make a mental note ? ?2.   You can keep a Social worker. ? Use a 3-ring notebook with sections for the following: calendar, important names and phone numbers,  medications, doctors' names/phone numbers, lists/reminders, and a section to journal what you did  each day.  ? ?3.    Use a calendar to write appointments down. ? ?4.    Write yourself a schedule for the day. ? This can be placed on the calendar or in a separate section of the Memory Notebook.  Keeping a  regular schedule can help memory. ? ?5.    Use medication organizer with sections for each day or morning/evening pills. ? You may need help loading it ? ?6.    Keep a basket, or pegboard by the door. ? Place items that you need to take out with you in the basket or on the pegboard.  You may also want to  include a message board for reminders. ? ?7.    Use sticky notes. ? Place sticky notes with reminders in a place where the task is performed.  For example: " turn off the  stove"  placed by the stove, "lock the door" placed on the door at eye level, " take your medications" on  the bathroom mirror or by the place where you normally take your medications. ? ?8.    Use alarms/timers. ? Use while cooking to remind yourself to check on food or as a reminder to take your medicine, or as a  reminder to make a call, or as a reminder to perform another task, etc. ? ? ? Alzheimer's Disease Caregiver Guide ?Alzheimer's disease is a condition that makes a person: ?Forget things. ?Act differently. ?Have trouble paying attention and doing simple tasks. ?These things get worse with time. The tips below can help you care for the person. ?How to help manage lifestyle changes ?Tips to help with symptoms ?Be calm and patient. ?Give simple, short answers to questions. ?Avoid correcting the person in a negative way. ?Try not to take things personally, even if the person forgets your name. ?Do not argue with the person. This may make the person more upset. ?Tips to lessen frustration ?Make appointments and do daily tasks when the person is at his or her best. ?Take your time. Simple tasks may take longer. Allow plenty of time to complete tasks. ?Limit choices for the person. ?Involve the person in what you are doing. ?Keep things organized: ?Keep a daily routine. ?Organize medicines in a pillbox  for each day of the week. ?Keep a calendar in a central location to remind the person of meetings or other activities. ?Avoid new or crowded places, if possible. ?Use simple words, short sentences, and a calm voice. Only give one direction at a time. ?Buy clothes and shoes that are easy to put on and take off. ?Try to change the subject if the person becomes frustrated or angry. ?Tips to prevent injury ? ?Keep floors clear. Remove rugs, magazine racks, and floor lamps. ?Keep hallways well-lit. ?Put a handrail and non-slip mat in the bathtub or shower. ?Put childproof locks on cabinets that have dangerous items in them.  These items include medicine, alcohol, guns, toxic cleaning items, sharp tools, matches, and lighters. ?Put locks on doors where the person cannot see or reach them. This helps keep the person from going out of the house and getting lost. ?Be ready for emergencies. Keep a list of emergency phone numbers and addresses close by. ?Remove car keys and lock garage doors so that the person does not try to drive. ?Bracelets may be worn that track location and identify the person as having memory problems. This should be worn at all times for safety. ?Tips for the future ? ?Discuss financial and legal planning early. People with this disease have trouble managing their money as the disease gets worse. Get help from a professional. ?Talk about advance directives, safety, and daily care. Take these steps: ?Create a living will and choose a power of attorney. This is someone who can make decisions for the person with Alzheimer's disease when he or she can no longer do so. ?Discuss driving safety and when to stop driving. The person's doctor can help with this. ?If the person lives alone, make sure he or she is safe. Some people need extra help at home. Other people need more care at a nursing home or care center. ?How to recognize changes in the person's condition ?With this disease, memory problems and confusion slowly get worse. In time, the person may not know his or her friends and family members. ?The disease can also cause changes in behavior and mood, such as anxiety or anger. The person may see, hear, taste, smell, or feel things that are not real (hallucinate). These changes can come on all of a sudden. They may happen in response to something such as: ?Pain. ?An infection. ?Changes in temperature or noise. ?Too much stimulation. ?Feeling lost or scared. ?Medicines. ?Where to find support ?Find out about services that can provide short-term care (respite care). These can allow you to take a break when you need  it. ?Join a support group near you. These groups can help you: ?Learn ways to manage stress. ?Share experiences with others. ?Get emotional comfort and support. ?Learn about caregiving as the disease gets worse. ?Know what community resources are available. ?Where to find more information ?Alzheimer's Association: [employer] ?Contact a doctor if: ?The person has a fever. ?The person has a sudden behavior change that does not get better with calming strategies. ?The person is not able to take care of himself or herself at home. ?You are no longer able to care for the person. ?Get help right away if: ?The person has a sudden increase in confusion or new hallucinations. ?The person threatens you or anyone else, including himself or herself. ?Get help right away if you feel like your loved one may hurt himself or herself or others, or has thoughts about taking his or her own life. Go  to your nearest emergency room or: ?Call your local emergency services (911 in the [place] ?Call the [facility] at 727-282-6566 or 988 in the [place] This is open 24 hours a day. ?Text the Crisis Text Line at 406-251-1413. ?Summary ?Alzheimer's disease causes a person to forget things. ?A person who has this condition may have trouble doing simple tasks. ?Take steps to keep the person from getting hurt. Plan for future care. ?You can find support by joining a support group near you. ?This information is not intended to replace advice given to you by your health care provider. Make sure you discuss any questions you have with your health care provider. ?Document Revised: 11/30/2020 Document Reviewed: 08/24/2019 ?Elsevier Patient Education ? [place]. ? ?

## 2021-08-08 ENCOUNTER — Other Ambulatory Visit: Payer: Self-pay

## 2021-08-08 ENCOUNTER — Ambulatory Visit (HOSPITAL_COMMUNITY)
Admission: RE | Admit: 2021-08-08 | Discharge: 2021-08-08 | Disposition: A | Discharge status: Home / Self Care | Payer: Medicare HMO | Source: Ambulatory Visit | Attending: Cardiovascular Disease | Admitting: Cardiovascular Disease

## 2021-08-08 ENCOUNTER — Ambulatory Visit: Payer: Medicare HMO | Admitting: Neurology

## 2021-08-08 DIAGNOSIS — R410 Disorientation, unspecified: Secondary | ICD-10-CM | POA: Diagnosis not present

## 2021-08-08 DIAGNOSIS — M79604 Pain in right leg: Secondary | ICD-10-CM | POA: Diagnosis not present

## 2021-08-08 DIAGNOSIS — R413 Other amnesia: Secondary | ICD-10-CM

## 2021-08-09 ENCOUNTER — Ambulatory Visit (INDEPENDENT_AMBULATORY_CARE_PROVIDER_SITE_OTHER): Payer: Medicare HMO | Admitting: *Deleted

## 2021-08-09 DIAGNOSIS — R413 Other amnesia: Secondary | ICD-10-CM

## 2021-08-09 DIAGNOSIS — I10 Essential (primary) hypertension: Secondary | ICD-10-CM

## 2021-08-09 DIAGNOSIS — C3492 Malignant neoplasm of unspecified part of left bronchus or lung: Secondary | ICD-10-CM

## 2021-08-09 NOTE — Patient Instructions (Signed)
Visit Information ? ?Taron, thank you for taking time to talk with me today about Cyniah's health care needs. Please don't hesitate to contact me if I can be of assistance to you before our next scheduled telephone appointment ? ?Below are the goals we discussed today:  ?Patient Self-Care Activities: ?Patient Aveya/ Caregiver [name] will ensure that patient will: ?Take medications as prescribed ?Attend all scheduled provider appointments ?Call pharmacy for medication refills ?Call provider office for new concerns or questions ?Continue to periodically monitor blood pressures at home ?Continue to follow low-salt heart healthy diet    ?Continue to stay as active as possible ?Continue to take efforts to prevent falls ? ?Our next scheduled telephone follow up visit/ appointment is scheduled on: Thursday, November 16, 2021 at 9:00 am- This is a PHONE [place] appointment ? ?If you need to cancel or re-schedule our visit, please call [phone] and our care guide team will be happy to assist you. ?  ?I look forward to hearing about your progress. ?  ?[name], RN, BSN, CCRN Alumnus ?[place] ?([phone]: direct office ? ?If you are experiencing a Mental Health or [place] or need someone to talk to, please  ?call the Suicide and Crisis Lifeline: 988 ?call the [place] National Suicide Prevention Lifeline: (534) 822-3338 or TTY: 740-256-7201 TTY (905)600-9116) to talk to a trained counselor ?call 1-800-273-TALK (toll free, 24 hour hotline) ?go to [facility] [address] [place] [id number]) ?call 911  ? ?Patient's caregiver/ daughter verbalizes understanding of instructions and care plan provided today and agrees to view in [place]. Active MyChart status confirmed with caregiver ? ?Managing Your Hypertension ?Hypertension, also called high blood pressure, is when the force of the blood pressing against the walls  of the arteries is too strong. Arteries are blood vessels that carry blood from your heart throughout your body. Hypertension forces the heart to work harder to pump blood and may cause the arteries to become narrow or stiff. ?Understanding blood pressure readings ?Your personal target blood pressure may vary depending on your medical conditions, your age, and other factors. A blood pressure reading includes a higher number over a lower number. Ideally, your blood pressure should be below 120/80. You should know that: ?The first, or top, number is called the systolic pressure. It is a measure of the pressure in your arteries as your heart beats. ?The second, or bottom number, is called the diastolic pressure. It is a measure of the pressure in your arteries as the heart relaxes. ?Blood pressure is classified into four stages. Based on your blood pressure reading, your health care provider may use the following stages to determine what type of treatment you need, if any. Systolic pressure and diastolic pressure are measured in a unit called mmHg. ?Normal ?Systolic pressure: below 242. ?Diastolic pressure: below 80. ?Elevated ?Systolic pressure: 353-614. ?Diastolic pressure: below 80. ?Hypertension stage 1 ?Systolic pressure: 431-540. ?Diastolic pressure: 08-67. ?Hypertension stage 2 ?Systolic pressure: 619 or above. ?Diastolic pressure: 90 or above. ?How can this condition affect me? ?Managing your hypertension is an important responsibility. Over time, hypertension can damage the arteries and decrease blood flow to important parts of the body, including the brain, heart, and kidneys. Having untreated or uncontrolled hypertension can lead to: ?A heart attack. ?A stroke. ?A weakened blood vessel (aneurysm). ?Heart failure. ?Kidney damage. ?Eye damage. ?Metabolic syndrome. ?Memory and concentration problems. ?Vascular dementia. ?What actions can I take to manage  this condition? ?Hypertension can be managed by making  lifestyle changes and possibly by taking medicines. Your health care provider will help you make a plan to bring your blood pressure within a normal range. ?Nutrition ? ?Eat a diet that is high in fiber and potassium, and low in salt (sodium), added sugar, and fat. An example eating plan is called the Dietary Approaches to Stop Hypertension (DASH) diet. To eat this way: ?Eat plenty of fresh fruits and vegetables. Try to fill one-half of your plate at each meal with fruits and vegetables. ?Eat whole grains, such as whole-wheat pasta, brown rice, or whole-grain bread. Fill about one-fourth of your plate with whole grains. ?Eat low-fat dairy products. ?Avoid fatty cuts of meat, processed or cured meats, and poultry with skin. Fill about one-fourth of your plate with lean proteins such as fish, chicken without skin, beans, eggs, and tofu. ?Avoid pre-made and processed foods. These tend to be higher in sodium, added sugar, and fat. ?Reduce your daily sodium intake. Most people with hypertension should eat less than 1,500 mg of sodium a day. ?Lifestyle ? ?Work with your health care provider to maintain a healthy body weight or to lose weight. Ask what an ideal weight is for you. ?Get at least 30 minutes of exercise that causes your heart to beat faster (aerobic exercise) most days of the week. Activities may include walking, swimming, or biking. ?Include exercise to strengthen your muscles (resistance exercise), such as weight lifting, as part of your weekly exercise routine. Try to do these types of exercises for 30 minutes at least 3 days a week. ?Do not use any products that contain nicotine or tobacco, such as cigarettes, e-cigarettes, and chewing tobacco. If you need help quitting, ask your health care provider. ?Control any long-term (chronic) conditions you have, such as high cholesterol or diabetes. ?Identify your sources of stress and find ways to manage stress. This may include meditation, deep breathing, or  making time for fun activities. ?Alcohol use ?Do not drink alcohol if: ?Your health care provider tells you not to drink. ?You are pregnant, may be pregnant, or are planning to become pregnant. ?If you drink alcohol: ?Limit how much you use to: ?0-1 drink a day for women. ?0-2 drinks a day for men. ?Be aware of how much alcohol is in your drink. In the [place] one drink equals one 12 oz bottle of beer (355 mL), one 5 oz glass of wine (148 mL), or one 1? oz glass of hard liquor (44 mL). ?Medicines ?Your health care provider may prescribe medicine if lifestyle changes are not enough to get your blood pressure under control and if: ?Your systolic blood pressure is 130 or higher. ?Your diastolic blood pressure is 80 or higher. ?Take medicines only as told by your health care provider. Follow the directions carefully. Blood pressure medicines must be taken as told by your health care provider. The medicine does not work as well when you skip doses. Skipping doses also puts you at risk for problems. ?Monitoring ?Before you monitor your blood pressure: ?Do not smoke, drink caffeinated beverages, or exercise within 30 minutes before taking a measurement. ?Use the bathroom and empty your bladder (urinate). ?Sit quietly for at least 5 minutes before taking measurements. ?Monitor your blood pressure at home as told by your health care provider. To do this: ?Sit with your back straight and supported. ?Place your feet flat on the floor. Do not cross your legs. ?Support your arm on a flat surface,  such as a table. Make sure your upper arm is at heart level. ?Each time you measure, take two or three readings one minute apart and record the results. ?You may also need to have your blood pressure checked regularly by your health care provider. ?General information ?Talk with your health care provider about your diet, exercise habits, and other lifestyle factors that may be contributing to hypertension. ?Review all the medicines you  take with your health care provider because there may be side effects or interactions. ?Keep all visits as told by your health care provider. Your health care provider can help you create and adjust your pla

## 2021-08-09 NOTE — Chronic Care Management (AMB) (Signed)
?Chronic Care Management  ? ?CCM RN Visit Note ? ?08/09/2021 ?Name: Angelica [name] MRN: [id number] DOB: August 30, 1940 ? ?Subjective: ?Angelica [name] is a 81 y.o. year old female who is a primary care patient of [name], MD. The care management team was consulted for assistance with disease management and care coordination needs.   ? ?Engaged with patient's caregiver/ daughter taron, on [place] by telephone for follow up visit in response to provider referral for case management and/or care coordination services.  ? ?Consent to Services:  ?The patient was given information about Chronic Care Management services, agreed to services, and gave verbal consent prior to initiation of services.  Please see initial visit note for detailed documentation.  ?Patient agreed to services and verbal consent obtained.  ? ?Assessment: Review of patient past medical history, allergies, medications, health status, including review of consultants reports, laboratory and other test data, was performed as part of comprehensive evaluation and provision of chronic care management services.  ? ?SDOH (Social Determinants of Health) assessments and interventions performed:  ?SDOH Interventions   ? ?Flowsheet Row Most Recent Value  ?SDOH Interventions   ?Food Insecurity Interventions Intervention Not Indicated  [caregiver continues to deny food insecurity]  ?Housing Interventions Intervention Not Indicated  [continues to reside with daughter/ caregiver]  ?Transportation Interventions Intervention Not Indicated  [Family continues to provide transportation]  ? ?  ? ?CCM Care Plan ? ?Allergies  ?Allergen Reactions  ? Asa Buff (Mag [Buffered Aspirin] Other (See Comments)  ?  Excessive sweating  ? Ensure [Nutritional Supplements]   ?  Or boost - upset stomach   ? Fish Allergy Other (See Comments)  ?  Unknown reaction, but allergic  ? Other Other (See Comments)  ?  Gelcaps; gel-containing capsules; extended-release medication - upset stomach   ?  Peanut-Containing Drug Products Other (See Comments)  ?  From allergy test  ? Penicillins Itching  ?  Has patient had a PCN reaction causing immediate rash, facial/tongue/throat swelling, SOB or lightheadedness with hypotension: No ?Has patient had a PCN reaction causing severe rash involving mucus membranes or skin necrosis: No ?Has patient had a PCN reaction that required hospitalization No ?Has patient had a PCN reaction occurring within the last 10 years: No ?If all of the above answers are "NO", then may proceed with Cephalosporin use.  ? Shellfish-Derived Products Other (See Comments)  ?  From allergy test  ? Strawberry Extract Other (See Comments)  ?  From allergy test   ? ?Outpatient Encounter Medications as of 08/09/2021  ?Medication Sig  ? acetaminophen (TYLENOL) 325 MG tablet Take 1 tablet (325 mg total) by mouth every 6 (six) hours as needed for headache or mild pain.  ? alum & mag hydroxide-simeth (MAALOX/MYLANTA) 200-200-20 MG/5ML suspension Take 15 mLs by mouth every 6 (six) hours as needed for indigestion or heartburn.  ? amLODipine (NORVASC) 10 MG tablet Take 1 tablet (10 mg total) by mouth daily.  ? Benralizumab (FASENRA) 30 MG/ML SOSY Inject 1 mL (30 mg total) into the skin every 8 (eight) weeks.  ? budesonide-formoterol (SYMBICORT) 80-4.5 MCG/ACT inhaler Inhale 2 puffs into the lungs 2 (two) times daily.  ? clopidogrel (PLAVIX) 75 MG tablet Take 1 tablet (75 mg total) by mouth daily.  ? diclofenac Sodium (VOLTAREN) 1 % GEL Apply 4 g topically 4 (four) times daily.  ? EPINEPHrine (EPIPEN 2-PAK) 0.3 mg/0.3 mL IJ SOAJ injection Use as directed for severe allergic reaction  ? ipratropium (ATROVENT) 0.06 %  nasal spray Place 2 sprays into both nostrils 4 (four) times daily as needed (nasal drainage).  ? levothyroxine (SYNTHROID) 50 MCG tablet Take 1 tablet (50 mcg total) by mouth daily before breakfast.  ? linaclotide (LINZESS) 72 MCG capsule TAKE 1 CAPSULE BY MOUTH ONCE DAILY AS NEEDED  ? loratadine  (CLARITIN) 10 MG tablet Take 10 mg by mouth daily.  ? Magnesium Hydroxide (PHILLIPS MILK OF MAGNESIA PO) Take 15-30 mLs by mouth daily as needed (for constipation).  ? meclizine (ANTIVERT) 25 MG tablet Take 1 tablet (25 mg total) by mouth 2 (two) times daily as needed for dizziness.  ? memantine (NAMENDA) 10 MG tablet Take 1 tablet (10 mg total) by mouth 2 (two) times daily.  ? memantine (NAMENDA) 5 MG tablet Take 1 tablet (5 mg at night) for 2 weeks, then increase to 1 tablet (5mg ) twice a day  ? montelukast (SINGULAIR) 10 MG tablet TAKE 1 TABLET BY MOUTH AT BEDTIME  ? ondansetron (ZOFRAN) 8 MG tablet Take 1 tablet (8 mg total) by mouth every 8 (eight) hours as needed for nausea or vomiting.  ? polyethylene glycol (MIRALAX / GLYCOLAX) 17 g packet Take 17 g by mouth daily as needed for mild constipation.  ? pravastatin (PRAVACHOL) 80 MG tablet Take 1 tablet by mouth once daily  ? vitamin B-12 (CYANOCOBALAMIN) 1000 MCG tablet Take 1,000 mcg by mouth daily.  ? ?Facility-Administered Encounter Medications as of 08/09/2021  ?Medication  ? Benralizumab SOSY 30 mg  ? ?Patient Active Problem List  ? Diagnosis Date Noted  ? Right leg pain 07/13/2021  ? Memory loss 07/13/2021  ? RLQ abdominal pain 07/13/2021  ? Encounter for well adult exam with abnormal findings 06/10/2021  ? 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 Anosmia 05/01/2021  ? COVID-19 04/25/2021  ? Debility 02/01/2021  ? Gait disorder 02/01/2021  ? Malignant neoplasm of bronchus and lung ([place])   ? Port-A-Cath in place 11/02/2020  ? Adenocarcinoma of left lung, stage 3 ([place]) 06/22/2020  ? Lung mass   ? Pulmonary nodule 05/17/2020  ? Iron deficiency 05/03/2020  ? Swelling of lower extremity 07/30/2019  ? Ganglion cyst of right foot 01/16/2019  ? HLD (hyperlipidemia) 01/16/2019  ? LPRD (laryngopharyngeal reflux disease) 07/25/2018  ? Constipation 08/19/2017  ? Asthma, severe persistent, well-controlled 06/04/2017  ? Chronic pansinusitis 06/04/2017  ? Nasal  polyposis 06/04/2017  ? Osteopenia 05/26/2017  ? Glaucoma 05/09/2017  ? GERD (gastroesophageal reflux disease)   ? H. pylori infection   ? Other allergic rhinitis 03/20/2016  ? Malnutrition of moderate degree 04/11/2015  ? HTN (hypertension) 04/10/2015  ? ?Conditions to be addressed/monitored:  HTN and lung cancer; dementia ? ?Care Plan : RN Care Manager Plan of Care  ?Updates made by Knox Royalty, RN since 08/09/2021 12:00 AM  ?  ? ?Problem: Chronic Disease Management Needs   ?Priority: Medium  ?  ? ?Long-Range Goal: Ongoing adherence to established plan of care for long term chronic disease management   ?Start Date: 12/26/2020  ?Expected End Date: 12/26/2021  ?Priority: Medium  ?Note:   ?Current Barriers:  ?Chronic Disease Management support and education needs related to HTN and lung cancer ?New COVID positive test 04/25/21- caregiver reports COVID symptoms have resolved (05/31/21) ? ?RNCM Clinical Goal(s):  ?Patient will demonstrate ongoing adherence to prescribed treatment plan for HTN and cancer as evidenced by adherence to prescribed medication regimen contacting provider for new or worsened symptoms or questions attending all scheduled provider appointments, monitoring blood pressure at  home weekly  through collaboration with RN Care manager, provider, and care team.  ? ?Interventions: ?1:1 collaboration with primary care provider regarding development and update of comprehensive plan of care as evidenced by provider attestation and co-signature ?Inter-disciplinary care team collaboration (see longitudinal plan of care) ?Evaluation of current treatment plan related to  self management and patient's adherence to plan as established by provider ?08/09/21: ?Review of patient status, including review of consultants reports, relevant laboratory and other test results, and medications completed ?SDOH updated: no new/ unmet concerns identified ?Pain assessment updated: caregiver denies that patient is is pain; reports  intermittent discomfort in her bilateral lower extremities as reported during PCP office visit 07/13/21- however, today, she reports that patient has shared with her recently that the discomfort "seems better"

## 2021-08-10 ENCOUNTER — Other Ambulatory Visit: Payer: Self-pay

## 2021-08-10 ENCOUNTER — Ambulatory Visit
Admission: RE | Admit: 2021-08-10 | Discharge: 2021-08-10 | Disposition: A | Discharge status: Home / Self Care | Payer: Medicare HMO | Source: Ambulatory Visit | Attending: Neurology | Admitting: Neurology

## 2021-08-10 DIAGNOSIS — R930 Abnormal findings on diagnostic imaging of skull and head, not elsewhere classified: Secondary | ICD-10-CM

## 2021-08-10 DIAGNOSIS — Z8673 Personal history of transient ischemic attack (TIA), and cerebral infarction without residual deficits: Secondary | ICD-10-CM | POA: Diagnosis not present

## 2021-08-10 DIAGNOSIS — I672 Cerebral atherosclerosis: Secondary | ICD-10-CM | POA: Diagnosis not present

## 2021-08-10 DIAGNOSIS — I6990 Unspecified sequelae of unspecified cerebrovascular disease: Secondary | ICD-10-CM

## 2021-08-10 DIAGNOSIS — Z85118 Personal history of other malignant neoplasm of bronchus and lung: Secondary | ICD-10-CM | POA: Diagnosis not present

## 2021-08-10 DIAGNOSIS — I771 Stricture of artery: Secondary | ICD-10-CM | POA: Diagnosis not present

## 2021-08-10 IMAGING — CT CT ANGIO HEAD
1 of 4 series · 6 of 30 positions shown · IV contrast (APPLIED)
Comparison: MR head without contrast [DATE] CT head without
contrast [DATE]

CLINICAL DATA: Stroke, follow-up. Abnormal MRI of the head. Lung
cancer

EXAM:
CT ANGIOGRAPHY HEAD AND NECK
TECHNIQUE: Multidetector CT imaging of the head and neck was performed using
the standard protocol during bolus administration of intravenous
contrast. Multiplanar CT image reconstructions and MIPs were
obtained to evaluate the vascular anatomy. Carotid stenosis
measurements (when applicable) are obtained utilizing NASCET
criteria, using the distal internal carotid diameter as the
denominator.

[Series 7: head/neck angio · axial · 0.49mm/px · z∈[+830,+1084]mm · 6 of 179 slices shown]
[im 26/179  brain]
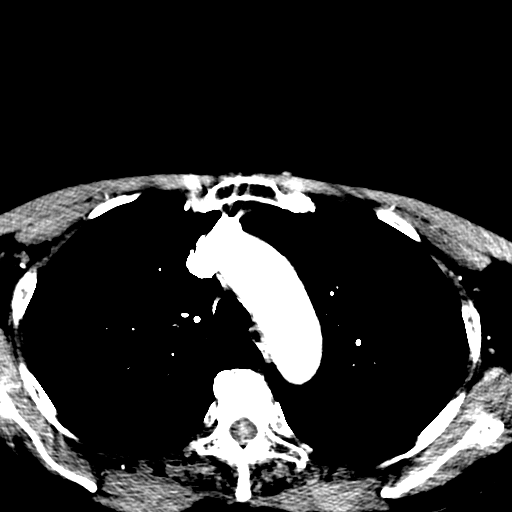
[im 51/179  bone]
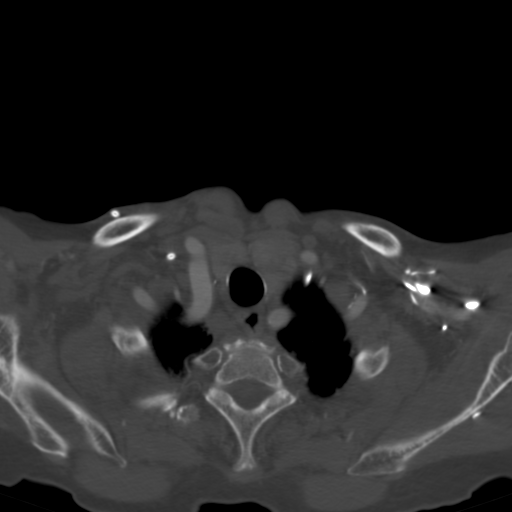
[im 77/179  brain]
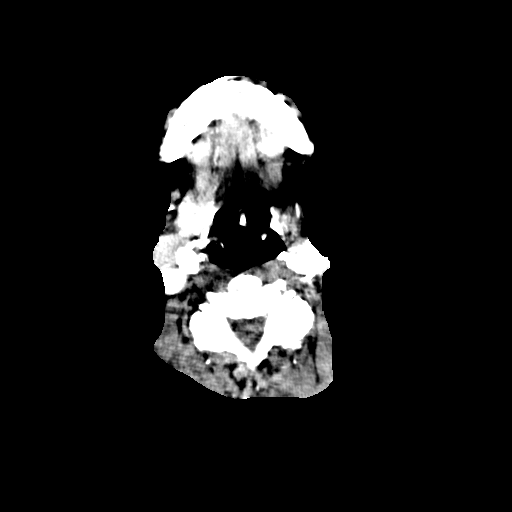
[im 102/179  bone]
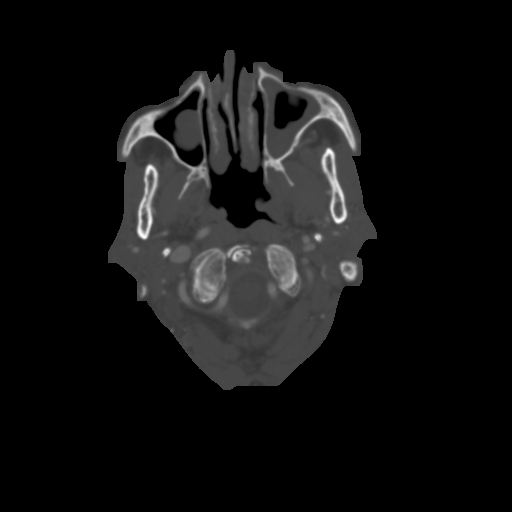
[im 128/179  brain]
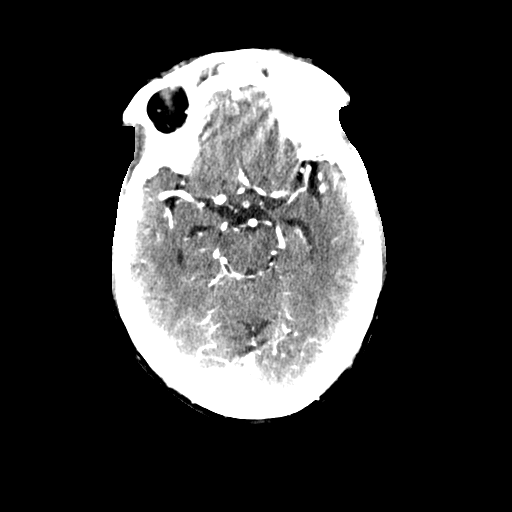
[im 153/179  bone]
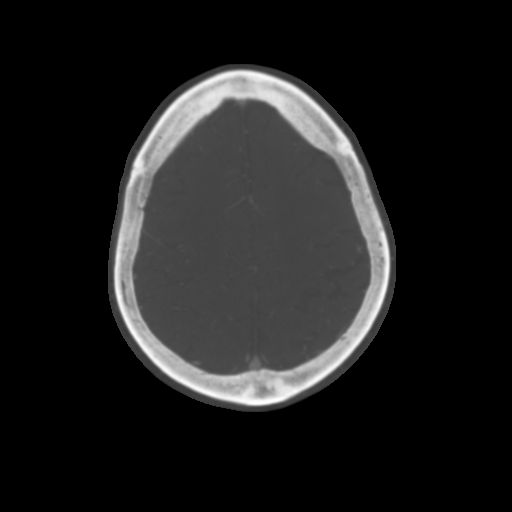

[6 of 30 positions shown; findings below may reference images not displayed]

RADIATION DOSE REDUCTION: This exam was performed according to the
departmental dose-optimization program which includes automated
exposure control, adjustment of the mA and/or kV according to
patient size and/or use of iterative reconstruction technique.

CONTRAST:  75mL [RG] IOPAMIDOL ([RG]) INJECTION 76%
FINDINGS: CT HEAD FINDINGS

Brain: Known periventricular white matter disease is stable. No
acute infarct, hemorrhage, or mass lesion is present. Mild atrophy
is stable. Basal ganglia are intact. Insular ribbon is normal. No
acute or focal cortical abnormalities are present. The brainstem and
cerebellum are within normal limits.

Vascular: Atherosclerotic calcifications are present within the
cavernous internal carotid arteries and at the dural margin of both
vertebral arteries. No hyperdense vessel is present.

Skull: Calvarium is intact. No focal lytic or blastic lesions are
present. No significant extracranial soft tissue lesion is present.

Sinuses: Extensive paranasal sinus disease is again noted. The fluid
level in left maxillary sinus is slightly decreased. Chronic wall
thickening is noted in both maxillary sinuses. Fluid in the right
sphenoid sinus has increased.

Orbits: Bilateral lens replacements are noted. Globes and orbits are
otherwise unremarkable.

Review of the MIP images confirms the above findings

CTA NECK FINDINGS

Aortic arch: Atherosclerotic calcifications are present at the
aortic arch a great vessel origins without significant focal
stenosis or aneurysm.

Right carotid system: The right common carotid artery is moderately
tortuous without focal stenosis. Atherosclerotic calcifications are
present at the right carotid bifurcation without significant
stenosis. Mild tortuosity is present in the cervical right ICA.

Left carotid system: The left common carotid artery demonstrates
mild tortuosity without focal stenosis. Atherosclerotic
calcifications are present at bifurcation without significant
stenosis. The cervical left ICA is mildly tortuous, particularly
just below the skull base. No significant stenosis is present.

Vertebral arteries: The left vertebral artery is the dominant
vessel. Both vertebral arteries originate from the subclavian
arteries without significant focal stenosis. Atherosclerotic
calcification is present at the origin of the left vertebral artery
without stenosis.

Skeleton: Facet degenerative changes are greatest at C2-3, C3-4 and
C4-5. Slight degenerative anterolisthesis present at C4-5. No focal
osseous lesions are evident.

Other neck: Multinodular goiter is noted. The largest nodule is in
the left lobe measuring up to 18 mm. No significant cervical
adenopathy is present. The submandibular and parotid glands and
ducts are within normal limits. No focal mucosal lesions are
present.

Upper chest: Lung apices are clear. Thoracic inlet is within normal
limits.

Review of the MIP images confirms the above findings

CTA HEAD FINDINGS

Anterior circulation: Atherosclerotic calcifications are present
within the cavernous internal carotid arteries bilaterally. No
significant stenosis is present through the ICA termini. The A1 and
M1 segments are normal. The anterior communicating artery is patent.
MCA bifurcations are within normal limits. The ACA and MCA branch
vessels are unremarkable.

Posterior circulation: Atherosclerotic calcifications are present at
the dural margin of both vertebral arteries. No significant stenosis
of greater than 50% is present. PICA origins are visualized and
within normal limits. Vertebrobasilar junction is normal. The
basilar artery is unremarkable. Both posterior cerebral arteries
originate from the basilar tip. The PCA branch vessels are within
normal limits bilaterally.

Venous sinuses: The dural sinuses are patent. The straight sinus
deep cerebral veins are intact. Cortical veins are within normal
limits. No significant vascular malformation is evident.

Anatomic variants: None

Review of the MIP images confirms the above findings
IMPRESSION: 1. Mild tortuosity of the cervical internal carotid arteries
bilaterally without significant stenosis.
2. Normal variant CTA Circle of Willis without significant proximal
stenosis, aneurysm, or branch vessel occlusion.
3. Multinodular goiter. The largest nodule is in the left lobe
measuring up to 18 mm. Recommend thyroid US (ref: [HOSPITAL].
[DATE]): 143-50).Thyroid ultrasound has been recommended on
multiple occasions but not performed.
4. Aortic Atherosclerosis ([RG]-[RG]).

## 2021-08-10 MED ORDER — IOPAMIDOL (ISOVUE-370) INJECTION 76%
75.0000 mL | Freq: Once | INTRAVENOUS | Status: AC | PRN
  Administered 2021-08-10: 75 mL via INTRAVENOUS

## 2021-08-14 ENCOUNTER — Telehealth: Payer: Self-pay

## 2021-08-14 NOTE — Telephone Encounter (Signed)
Pt daughter requesting a refill for: ?levothyroxine (SYNTHROID) 50 MCG tablet ? ?Pharmacy: ?[facility], [address] ? ?LOV 07/13/21 ?ROV 01/10/22 ? ? ?

## 2021-08-16 ENCOUNTER — Ambulatory Visit: Payer: Medicare HMO | Admitting: Internal Medicine

## 2021-08-16 MED ORDER — LEVOTHYROXINE SODIUM 50 MCG PO TABS: 50.0000 ug | ORAL_TABLET | Freq: Every day | ORAL | 3 refills | Status: DC

## 2021-08-16 NOTE — Telephone Encounter (Signed)
Prescription sent to pharmacy and patient's daughter notified.  ?

## 2021-08-18 DIAGNOSIS — Z85118 Personal history of other malignant neoplasm of bronchus and lung: Secondary | ICD-10-CM

## 2021-08-18 DIAGNOSIS — I10 Essential (primary) hypertension: Secondary | ICD-10-CM | POA: Diagnosis not present

## 2021-09-15 ENCOUNTER — Ambulatory Visit (INDEPENDENT_AMBULATORY_CARE_PROVIDER_SITE_OTHER): Payer: Medicare HMO

## 2021-09-15 DIAGNOSIS — J4550 Severe persistent asthma, uncomplicated: Secondary | ICD-10-CM | POA: Diagnosis not present

## 2021-09-19 ENCOUNTER — Ambulatory Visit: Payer: Medicare HMO | Admitting: Neurology

## 2021-09-22 ENCOUNTER — Encounter: Payer: Self-pay | Admitting: Physician Assistant

## 2021-09-26 ENCOUNTER — Encounter: Payer: Self-pay | Admitting: Podiatry

## 2021-09-26 ENCOUNTER — Ambulatory Visit (INDEPENDENT_AMBULATORY_CARE_PROVIDER_SITE_OTHER): Payer: Medicare HMO | Admitting: Podiatry

## 2021-09-26 DIAGNOSIS — M79674 Pain in right toe(s): Secondary | ICD-10-CM

## 2021-09-26 DIAGNOSIS — Q828 Other specified congenital malformations of skin: Secondary | ICD-10-CM | POA: Diagnosis not present

## 2021-09-26 DIAGNOSIS — M2042 Other hammer toe(s) (acquired), left foot: Secondary | ICD-10-CM

## 2021-09-26 DIAGNOSIS — M79675 Pain in left toe(s): Secondary | ICD-10-CM | POA: Diagnosis not present

## 2021-09-26 DIAGNOSIS — M2041 Other hammer toe(s) (acquired), right foot: Secondary | ICD-10-CM

## 2021-09-26 DIAGNOSIS — B351 Tinea unguium: Secondary | ICD-10-CM | POA: Diagnosis not present

## 2021-10-05 NOTE — Progress Notes (Signed)
Subjective: Angelica [name] presents today referred by [name], MD for complaint of with chief concern of elongated, thickened, painful, discolored toenails for greater than one year. Patient has tried self trimming in the past, but now they are too thick.  Patient denies any dx of diabetes, denies CVA and MI.  Patient is accompanied by her daughter on today's visit.  Past Medical History:  Diagnosis Date   Arthritis    Asthma    Bowel obstruction ([place])    Cancer ([place])    Environmental allergies    GERD (gastroesophageal reflux disease)    Glaucoma 05/09/2017   H. pylori infection    History of radiation therapy 07/05/20-08/05/20   Left lung IMRT Dr. 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   Anosmia 05/01/2021   COVID-19 04/25/2021   Debility 02/01/2021   Gait disorder 02/01/2021   Malignant neoplasm of bronchus and lung ([place])    Port-A-Cath in place 11/02/2020   Adenocarcinoma of left lung, 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 pylori infection    Other allergic rhinitis 03/20/2016   Malnutrition of moderate degree 04/11/2015   HTN (hypertension)  04/10/2015     Past Surgical History:  Procedure Laterality Date   ABDOMINAL HYSTERECTOMY     BOWEL RESECTION N/A 05/23/2020   Procedure: SMALL BOWEL REPAIR;  Surgeon: Kieth Brightly, Arta Bruce, MD;  Location: [facility];  Service: General;  Laterality: N/A;   BREAST BIOPSY     BRONCHIAL BIOPSY  06/10/2020   Procedure: BRONCHIAL BIOPSIES;  Surgeon: Garner Nash, DO;  Location: [facility];  Service: Pulmonary;;   BRONCHIAL BRUSHINGS  06/10/2020   Procedure: BRONCHIAL BRUSHINGS;  Surgeon: Garner Nash, DO;  Location: [facility];  Service: Pulmonary;;   BRONCHIAL NEEDLE ASPIRATION BIOPSY  06/10/2020   Procedure: BRONCHIAL NEEDLE ASPIRATION BIOPSIES;  Surgeon: Garner Nash, DO;  Location: [facility];  Service: Pulmonary;;   BRONCHIAL WASHINGS  06/10/2020   Procedure: BRONCHIAL WASHINGS;  Surgeon: Garner Nash, DO;  Location: [facility];  Service: Pulmonary;;   COLON SURGERY     DIAGNOSTIC LARYNGOSCOPY N/A 05/23/2020   Procedure: ATTEMPTED DIAGNOSTIC LAPAROSCOPY WITH ADHESIONS;  Surgeon: Kieth Brightly, Arta Bruce, MD;  Location: [facility];  Service: General;  Laterality: N/A;   IR IMAGING GUIDED PORT INSERTION  07/15/2020   KNEE SURGERY     LAPAROTOMY N/A 04/18/2015   Procedure: Exploratory laparotomy with lysis of adhesions, possible bowel resection;  Surgeon: Ralene Ok, MD;  Location: [facility];  Service: General;  Laterality: N/A;   LAPAROTOMY N/A 05/23/2020   Procedure: EXPLORATORY LAPAROTOMY;  Surgeon: Mickeal Skinner, MD;  [facility];  Service: General;  Laterality: N/A;   LYSIS OF ADHESION N/A 05/23/2020   Procedure: LYSIS OF ADHESION;  Surgeon: Kieth Brightly, Arta Bruce, MD;  Location: [facility];  Service: General;  Laterality: N/A;   NASAL SINUS SURGERY     THYROID SURGERY     VIDEO BRONCHOSCOPY WITH ENDOBRONCHIAL NAVIGATION Bilateral 06/10/2020   Procedure: VIDEO BRONCHOSCOPY WITH ENDOBRONCHIAL NAVIGATION;  Surgeon: Garner Nash, DO;  Location: [facility];  Service: Pulmonary;   Laterality: Bilateral;   VIDEO BRONCHOSCOPY WITH ENDOBRONCHIAL ULTRASOUND  06/10/2020   Procedure: VIDEO BRONCHOSCOPY WITH ENDOBRONCHIAL ULTRASOUND;  Surgeon: Garner Nash, DO;  Location: [facility];  Service: Pulmonary;;     Current Outpatient Medications on File Prior to Visit  Medication Sig Dispense Refill   acetaminophen (TYLENOL) 325 MG tablet Take 1 tablet (325 mg total) by mouth every 6 (six) hours as needed for headache or mild pain.     alum & mag hydroxide-simeth (MAALOX/MYLANTA) 200-200-20 MG/5ML suspension Take 15 mLs by mouth every 6 (six) hours as needed for indigestion or heartburn.     amLODipine (NORVASC) 10 MG tablet Take 1 tablet (10 mg total) by mouth daily. 90 tablet 3   Benralizumab (FASENRA) 30 MG/ML SOSY Inject 1 mL (30 mg total) into the skin every 8 (eight) weeks. 1 mL 6   budesonide-formoterol (SYMBICORT) 80-4.5 MCG/ACT inhaler Inhale 2 puffs into the lungs 2 (two) times daily. 10.2 g 5   clopidogrel (PLAVIX) 75 MG tablet Take 1 tablet (75 mg total) by mouth daily. 90 tablet 3   diclofenac Sodium (VOLTAREN) 1 % GEL Apply 4 g topically 4 (four) times daily. 100 g 0   EPINEPHrine (EPIPEN 2-PAK) 0.3 mg/0.3 mL IJ SOAJ injection Use as directed for severe allergic reaction 2 each 1   fluticasone (FLONASE) 50 MCG/ACT nasal spray Place 2 sprays into both nostrils daily.     ipratropium (ATROVENT) 0.06 % nasal spray Place 2 sprays into both nostrils 4 (four) times daily as needed (nasal drainage). 15 mL 5   levothyroxine (SYNTHROID) 50 MCG tablet Take 1 tablet (50 mcg total) by mouth daily before breakfast. 90 tablet 3   linaclotide (LINZESS) 72 MCG capsule TAKE 1 CAPSULE BY MOUTH ONCE DAILY AS NEEDED (Patient not taking: Reported on 09/26/2021) 30 capsule 0   loratadine (CLARITIN) 10 MG tablet Take 10 mg by mouth daily.     Magnesium Hydroxide (PHILLIPS MILK OF MAGNESIA PO) Take 15-30 mLs by mouth daily as needed (for constipation).     meclizine (ANTIVERT) 25 MG tablet  Take 1 tablet (25 mg total) by mouth 2 (two) times daily as needed for dizziness. 10 tablet 0   memantine (NAMENDA) 10 MG tablet Take 1 tablet (10 mg total) by mouth 2 (two) times daily. 60 tablet 5   memantine (NAMENDA) 5 MG tablet Take 1 tablet (5 mg at night) for 2 weeks, then increase to 1 tablet (5mg ) twice a day 60 tablet 11   montelukast (SINGULAIR) 10 MG tablet TAKE 1 TABLET BY MOUTH AT BEDTIME (Patient not taking: Reported on 09/26/2021) 30 tablet 4   ondansetron (ZOFRAN) 8 MG tablet Take 1 tablet (8 mg total) by mouth every 8 (eight) hours as needed for nausea or vomiting. 20 tablet 0   polyethylene glycol (MIRALAX / GLYCOLAX) 17 g packet Take 17 g by mouth daily as needed for mild constipation.     pravastatin (PRAVACHOL) 80 MG tablet Take 1 tablet by mouth once daily 90 tablet 3  vitamin B-12 (CYANOCOBALAMIN) 1000 MCG tablet Take 1,000 mcg by mouth daily. (Patient not taking: Reported on 09/26/2021)     Current Facility-Administered Medications on File Prior to Visit  Medication Dose Route Frequency Provider Last Rate Last Admin   Benralizumab SOSY 30 mg  30 mg Subcutaneous Q8 Weeks Kennith Gain, MD   30 mg at 09/15/21 4765     Allergies  Allergen Reactions   Diona Fanti Buff (Mag [Buffered Aspirin] Other (See Comments)    Excessive sweating   Ensure [Nutritional Supplements]     Or boost - upset stomach    Fish Allergy Other (See Comments)    Unknown reaction, but allergic   Other Other (See Comments)    Gelcaps; gel-containing capsules; extended-release medication - upset stomach    Peanut-Containing Drug Products Other (See Comments)    From allergy test   Penicillins Itching    Has patient had a PCN reaction causing immediate rash, facial/tongue/throat swelling, SOB or lightheadedness with hypotension: No Has patient had a PCN reaction causing severe rash involving mucus membranes or skin necrosis: No Has patient had a PCN reaction that required hospitalization No Has  patient had a PCN reaction occurring within the last 10 years: No If all of the above answers are "NO", then may proceed with Cephalosporin use.   Shellfish-Derived Products Other (See Comments)    From allergy test   Strawberry Extract Other (See Comments)    From allergy test      Social History   Occupational History   Occupation: retired  Tobacco Use   Smoking status: Former    Types: Cigarettes   Smokeless tobacco: Never   Tobacco comments:    quit 63 years ago  Vaping Use   Vaping Use: Never used  Substance and Sexual Activity   Alcohol use: No   Drug use: No   Sexual activity: Never     Family History  Problem Relation Age of Onset   Diabetes Mother    Hypertension Mother    Hypertension Father    Glaucoma Sister    Glaucoma Brother    Allergic rhinitis Neg Hx    Angioedema Neg Hx    Asthma Neg Hx    Eczema Neg Hx    Immunodeficiency Neg Hx    Urticaria Neg Hx      Immunization History  Administered Date(s) Administered   Fluad Quad(high Dose 65+) 01/16/2019, 03/05/2020, 02/01/2021   Influenza, High Dose Seasonal PF 01/30/2018   PFIZER(Purple Top)SARS-COV-2 Vaccination 07/13/2019, 08/03/2019, 02/16/2020   Pneumococcal Conjugate-13 11/08/2017     Objective: ALEXIS REBER is a pleasant 81 y.o. female WD, WN in NAD. AAO x 3.  There were no vitals filed for this visit.  Vascular Examination:  CFT <3 seconds b/l LE. Palpable DP pulse(s) b/l LE. Faintly palpable PT pulse(s) b/l LE. Pedal hair absent. No pain with calf compression b/l. Lower extremity skin temperature gradient within normal limits. No edema noted b/l LE. No cyanosis or clubbing noted b/l LE.  Dermatological Examination: Pedal integument with normal turgor, texture and tone b/l LE. No open wounds b/l. No interdigital macerations b/l. Toenails 1-5 b/l elongated, thickened, discolored with subungual debris. +Tenderness with dorsal palpation of nailplates. Porokeratotic lesion(s) noted R 5th  toe.  Musculoskeletal: Normal muscle strength 5/5 to all lower extremity muscle groups bilaterally. Hammertoe deformity noted 2-5 b/l.Marland Kitchen No pain, crepitus or joint limitation noted with ROM b/l LE.  Patient ambulates independently without assistive aids.  Neurological: Protective sensation  intact 5/5 intact bilaterally with 10g monofilament b/l. Vibratory sensation intact b/l.  A1c:      Latest Ref Rng & Units 07/18/2021    8:35 AM  Hemoglobin A1C  Hemoglobin-A1c 4.6 - 6.5 % 5.8     Assessment: 1. Pain due to onychomycosis of toenails of both feet   2. Porokeratosis   3. Acquired hammertoes of both feet     Plan: -Patient was evaluated and treated. All patient's and/or POA's questions/concerns answered on today's visit. -Medicare ABN signed. Patient consents for services of paring of corn(s)/callus(es)/porokeratos(es) today. Copy in patient chart. -Discussed topical, laser and oral medication for onychomycosis. Patient opted for toenail debridement only on today.  -Toenails 1-5 b/l were debrided in length and girth with sterile nail nippers and dremel without iatrogenic bleeding.  -Porokeratotic lesion(s) R 5th toe pared and enucleated with sterile scalpel blade without incident. Total number of lesions debrided=1. -Patient/POA to call should there be question/concern in the interim.  Return in about 3 months (around 12/27/2021).  Marzetta Board, DPM

## 2021-10-19 ENCOUNTER — Emergency Department (HOSPITAL_COMMUNITY)
Admission: EM | Admit: 2021-10-19 | Discharge: 2021-10-19 | Disposition: A | Discharge status: Home / Self Care | Payer: Medicare HMO | Attending: Emergency Medicine | Admitting: Emergency Medicine

## 2021-10-19 ENCOUNTER — Emergency Department (HOSPITAL_COMMUNITY): Payer: Medicare HMO

## 2021-10-19 ENCOUNTER — Ambulatory Visit: Payer: Medicare HMO | Admitting: Family Medicine

## 2021-10-19 ENCOUNTER — Encounter (HOSPITAL_COMMUNITY): Payer: Self-pay

## 2021-10-19 DIAGNOSIS — Z91010 Allergy to peanuts: Secondary | ICD-10-CM | POA: Insufficient documentation

## 2021-10-19 DIAGNOSIS — Z79899 Other long term (current) drug therapy: Secondary | ICD-10-CM | POA: Insufficient documentation

## 2021-10-19 DIAGNOSIS — F0390 Unspecified dementia without behavioral disturbance: Secondary | ICD-10-CM | POA: Insufficient documentation

## 2021-10-19 DIAGNOSIS — Z7901 Long term (current) use of anticoagulants: Secondary | ICD-10-CM | POA: Insufficient documentation

## 2021-10-19 DIAGNOSIS — R1031 Right lower quadrant pain: Secondary | ICD-10-CM | POA: Diagnosis not present

## 2021-10-19 DIAGNOSIS — R109 Unspecified abdominal pain: Secondary | ICD-10-CM

## 2021-10-19 DIAGNOSIS — I10 Essential (primary) hypertension: Secondary | ICD-10-CM | POA: Insufficient documentation

## 2021-10-19 LAB — URINALYSIS, ROUTINE W REFLEX MICROSCOPIC
Bilirubin Urine: NEGATIVE
Glucose, UA: NEGATIVE mg/dL
Hgb urine dipstick: NEGATIVE
Ketones, ur: NEGATIVE mg/dL
Leukocytes,Ua: NEGATIVE
Nitrite: NEGATIVE
Protein, ur: NEGATIVE mg/dL
Specific Gravity, Urine: 1.001 — ABNORMAL LOW (ref 1.005–1.030)
pH: 7 (ref 5.0–8.0)

## 2021-10-19 LAB — CBC WITH DIFFERENTIAL/PLATELET
Abs Immature Granulocytes: 0.01 10*3/uL (ref 0.00–0.07)
Basophils Absolute: 0 10*3/uL (ref 0.0–0.1)
Basophils Relative: 0 %
Eosinophils Absolute: 0.1 10*3/uL (ref 0.0–0.5)
Eosinophils Relative: 2 %
HCT: 39.5 % (ref 36.0–46.0)
Hemoglobin: 13.4 g/dL (ref 12.0–15.0)
Immature Granulocytes: 0 %
Lymphocytes Relative: 22 %
Lymphs Abs: 1.3 10*3/uL (ref 0.7–4.0)
MCH: 30.4 pg (ref 26.0–34.0)
MCHC: 33.9 g/dL (ref 30.0–36.0)
MCV: 89.6 fL (ref 80.0–100.0)
Monocytes Absolute: 0.5 10*3/uL (ref 0.1–1.0)
Monocytes Relative: 8 %
Neutro Abs: 3.9 10*3/uL (ref 1.7–7.7)
Neutrophils Relative %: 68 %
Platelets: 258 10*3/uL (ref 150–400)
RBC: 4.41 MIL/uL (ref 3.87–5.11)
RDW: 14.2 % (ref 11.5–15.5)
WBC: 5.8 10*3/uL (ref 4.0–10.5)
nRBC: 0 % (ref 0.0–0.2)

## 2021-10-19 LAB — COMPREHENSIVE METABOLIC PANEL
ALT: 13 U/L (ref 0–44)
AST: 21 U/L (ref 15–41)
Albumin: 3.6 g/dL (ref 3.5–5.0)
Alkaline Phosphatase: 88 U/L (ref 38–126)
Anion gap: 6 (ref 5–15)
BUN: 7 mg/dL — ABNORMAL LOW (ref 8–23)
CO2: 24 mmol/L (ref 22–32)
Calcium: 9 mg/dL (ref 8.9–10.3)
Chloride: 106 mmol/L (ref 98–111)
Creatinine, Ser: 0.74 mg/dL (ref 0.44–1.00)
GFR, Estimated: >60 mL/min (ref >60)
Glucose, Bld: 106 mg/dL — ABNORMAL HIGH (ref 70–99)
Potassium: 3.5 mmol/L (ref 3.5–5.1)
Sodium: 136 mmol/L (ref 135–145)
Total Bilirubin: 0.5 mg/dL (ref 0.3–1.2)
Total Protein: 7.3 g/dL (ref 6.5–8.1)

## 2021-10-19 LAB — LIPASE, BLOOD: Lipase: 27 U/L (ref 11–51)

## 2021-10-19 IMAGING — CT CT ABD-PELV W/ CM
2 of 5 series · 16 of 46 positions shown, 18 images · IV contrast (agent unspecified)
Comparison: [DATE]

CLINICAL DATA: Abdominal pain, acute, nonlocalized. History of lung
cancer

EXAM:
CT ABDOMEN AND PELVIS WITH CONTRAST
TECHNIQUE: Multidetector CT imaging of the abdomen and pelvis was performed
using the standard protocol following bolus administration of
intravenous contrast.

[Series 2: axial st · axial · 0.68mm/px · z∈[-488,-108]mm · 13 of 88 slices shown, 15 images]
[im 6/88  soft-tissue]
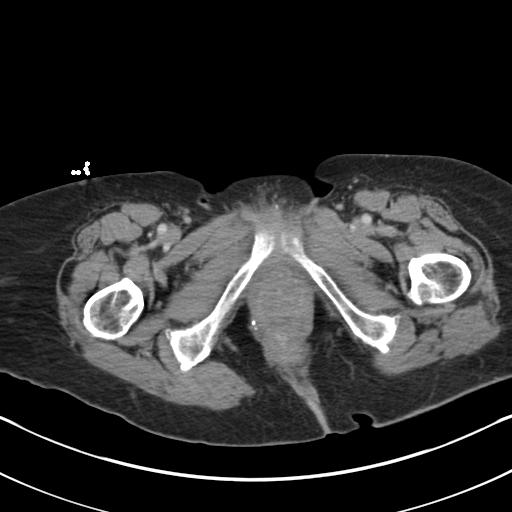
[im 6/88  bone]
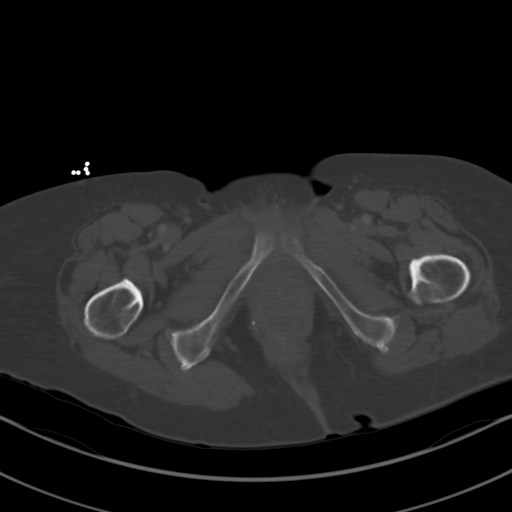
[im 12/88  soft-tissue]
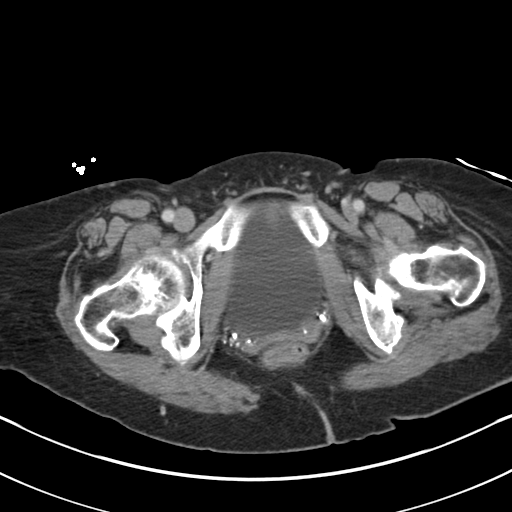
[im 18/88  soft-tissue]
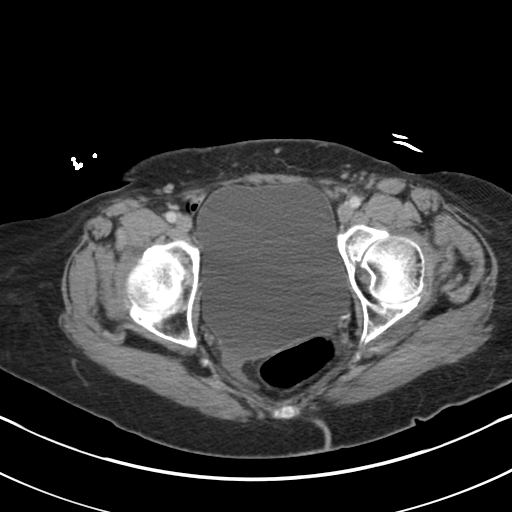
[im 24/88  soft-tissue]
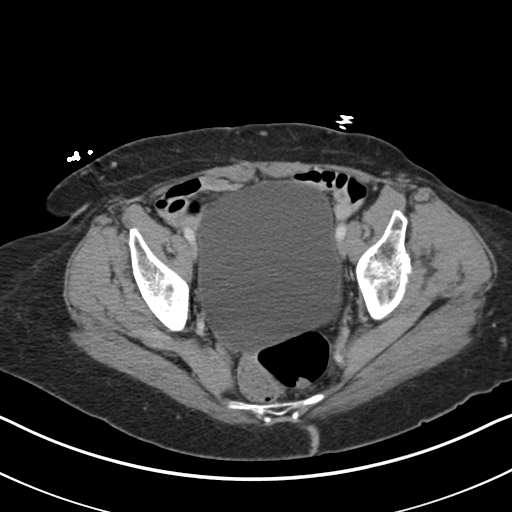
[im 30/88  soft-tissue]
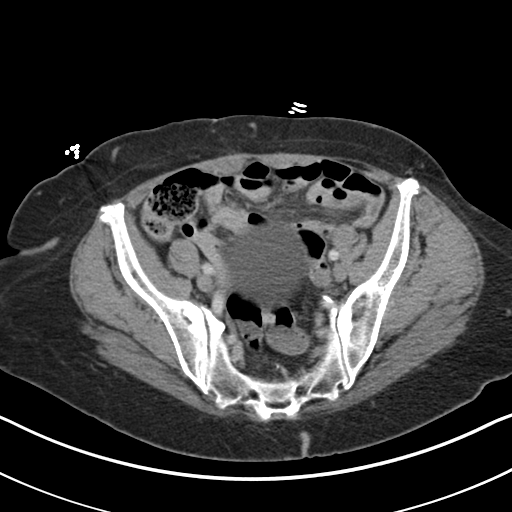
[im 35/88  soft-tissue]
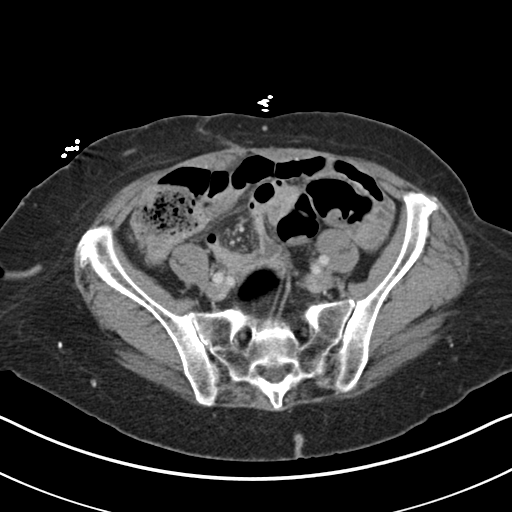
[im 47/88  soft-tissue]
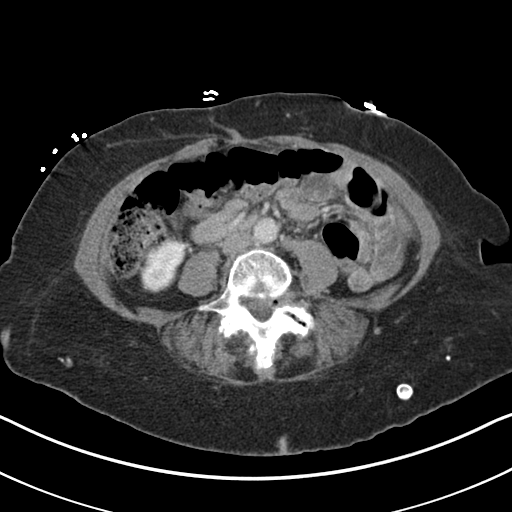
[im 53/88  soft-tissue]
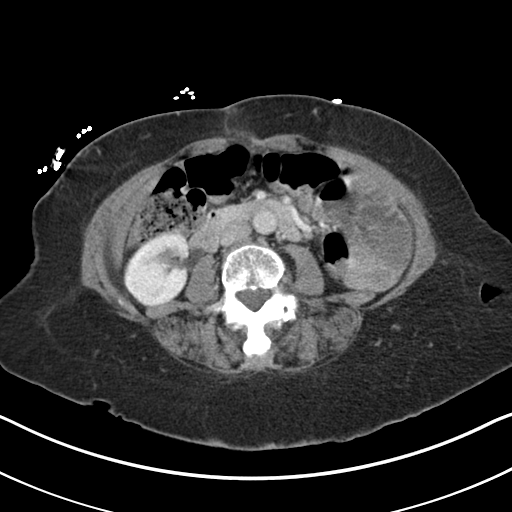
[im 59/88  soft-tissue]
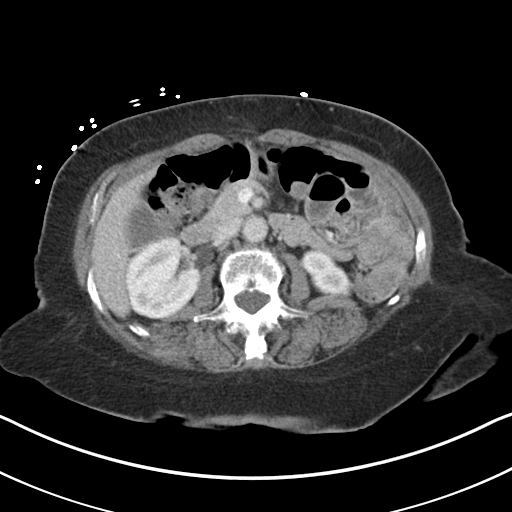
[im 59/88  bone]
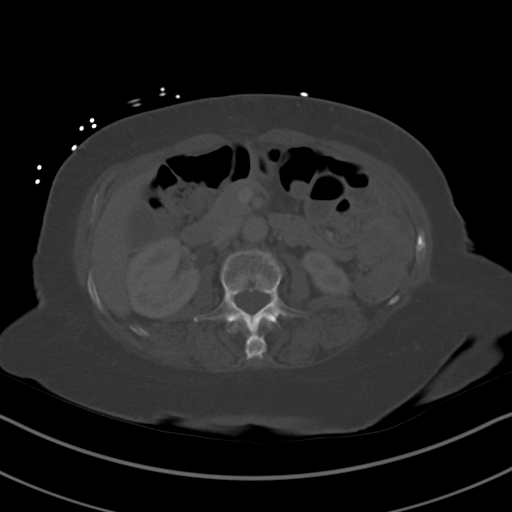
[im 64/88  soft-tissue]
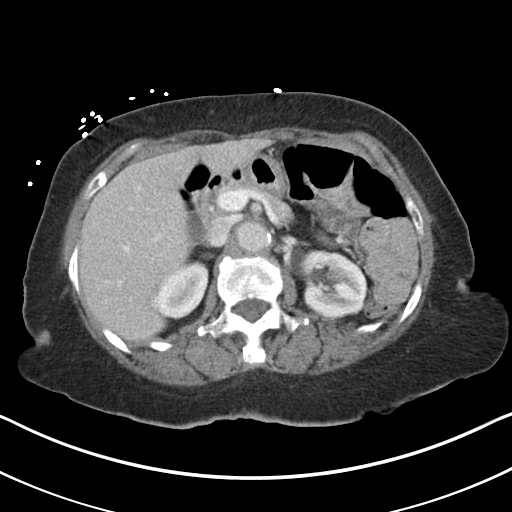
[im 70/88  soft-tissue]
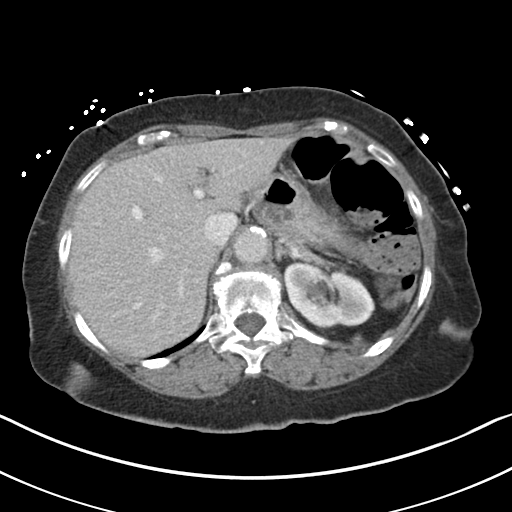
[im 76/88  soft-tissue]
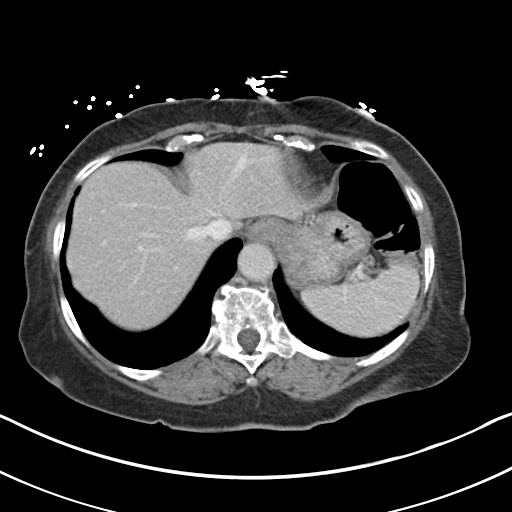
[im 82/88  soft-tissue]
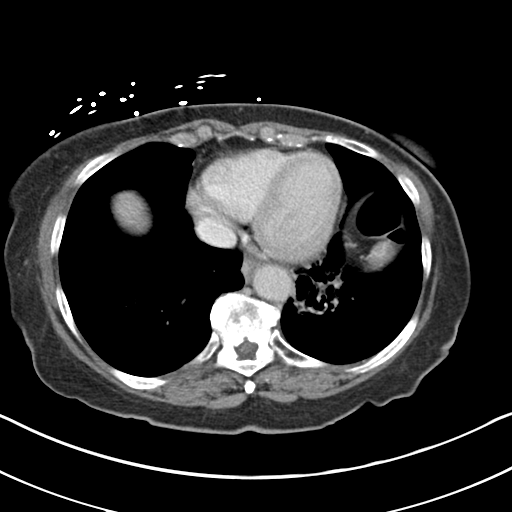

[Series 4: coronal st · coronal · 0.73mm/px · 3 of 120 slices shown]
[im 40/120  soft-tissue]
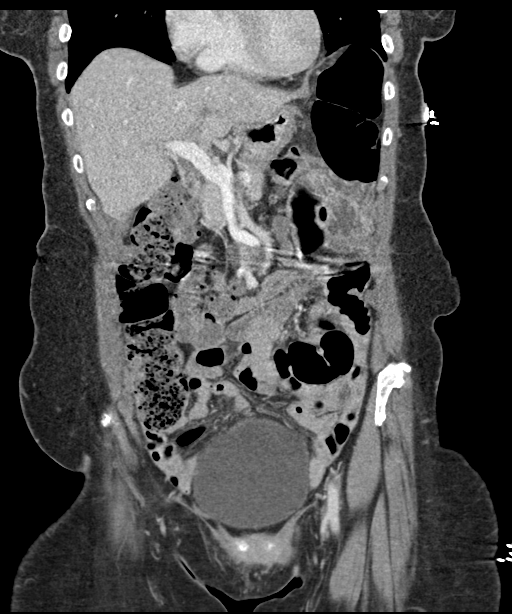
[im 53/120  soft-tissue]
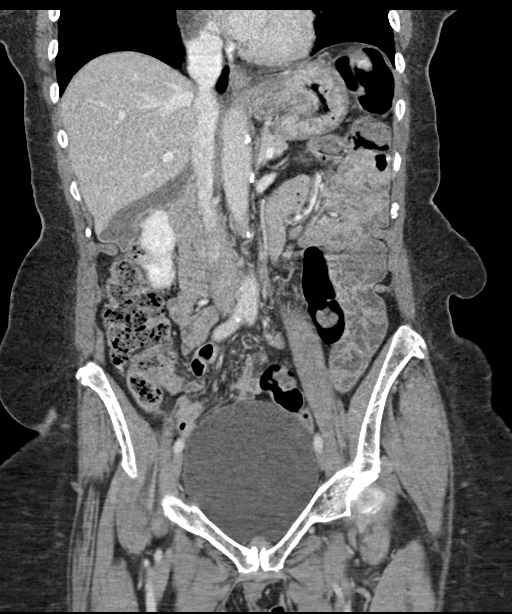
[im 67/120  soft-tissue]
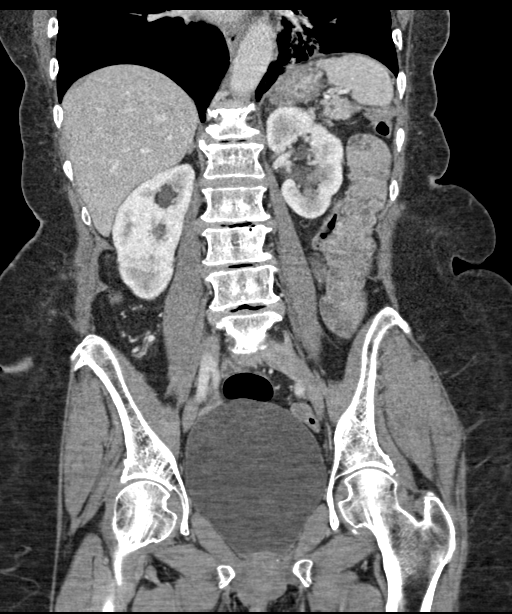

[16 of 46 positions shown; findings below may reference images not displayed]

RADIATION DOSE REDUCTION: This exam was performed according to the
departmental dose-optimization program which includes automated
exposure control, adjustment of the mA and/or kV according to
patient size and/or use of iterative reconstruction technique.

CONTRAST:  100mL OMNIPAQUE IOHEXOL 300 MG/ML  SOLN
FINDINGS: Lower chest: Post treatment changes within the left lower lobe
centrally, similar in appearance to previous chest CT. Included lung
bases are otherwise clear.

Hepatobiliary: No focal liver abnormality is seen. No gallstones,
gallbladder wall thickening, or biliary dilatation.

Pancreas: Unremarkable. No pancreatic ductal dilatation or
surrounding inflammatory changes.

Spleen: Normal in size without focal abnormality.

Adrenals/Urinary Tract: Unremarkable adrenal glands. Kidneys enhance
symmetrically without focal lesion, stone, or hydronephrosis.
Bilateral extrarenal pelvis. Ureters are nondilated. Urinary bladder
appears unremarkable.

Stomach/Bowel: Stomach is within normal limits. Appendix not
definitively visualized. No evidence of bowel wall thickening,
distention, or inflammatory changes.

Vascular/Lymphatic: Scattered aortoiliac atherosclerotic
calcifications without aneurysm. No abdominopelvic lymphadenopathy.

Reproductive: Status post hysterectomy. No adnexal masses.

Other: No free fluid. No abdominopelvic fluid collection. No
pneumoperitoneum. No abdominal wall hernia.

Musculoskeletal: No acute or significant osseous findings. Similar
degree of lower lumbar spondylosis.
IMPRESSION: 1. No acute abdominopelvic findings.
2. Stable post treatment changes within the left lower lung.
3. Aortic Atherosclerosis ([TV]-[TV]).

## 2021-10-19 MED ORDER — HEPARIN SOD (PORK) LOCK FLUSH 100 UNIT/ML IV SOLN
500.0000 [IU] | Freq: Once | INTRAVENOUS | Status: AC
  Administered 2021-10-19: 500 [IU]
  Filled 2021-10-19: qty 5

## 2021-10-19 MED ORDER — SODIUM CHLORIDE (PF) 0.9 % IJ SOLN
INTRAMUSCULAR | Status: AC
  Filled 2021-10-19: qty 50

## 2021-10-19 MED ORDER — LIDOCAINE VISCOUS HCL 2 % MT SOLN
15.0000 mL | Freq: Once | OROMUCOSAL | Status: AC
  Administered 2021-10-19: 15 mL via ORAL
  Filled 2021-10-19: qty 15

## 2021-10-19 MED ORDER — PANTOPRAZOLE SODIUM 20 MG PO TBEC: 20.0000 mg | DELAYED_RELEASE_TABLET | Freq: Every day | ORAL | 0 refills | Status: DC

## 2021-10-19 MED ORDER — IOHEXOL 300 MG/ML  SOLN
100.0000 mL | Freq: Once | INTRAMUSCULAR | Status: AC | PRN
  Administered 2021-10-19: 100 mL via INTRAVENOUS

## 2021-10-19 MED ORDER — ALUM & MAG HYDROXIDE-SIMETH 200-200-20 MG/5ML PO SUSP
30.0000 mL | Freq: Once | ORAL | Status: AC
  Administered 2021-10-19: 30 mL via ORAL
  Filled 2021-10-19: qty 30

## 2021-10-19 NOTE — ED Provider Notes (Signed)
I provided a substantive portion of the care of this patient.  I personally performed the entirety of the exam and medical decision making for this encounter.     For several months patient is intermittently been having burning central upper abdominal pain.  Patient family members do note that certain foods seem to trigger it more than others.  Patient used to be on PPI before she did chemotherapy but has not taken any for over a year.  At the time of exam patient is pain-free  Patient is alert and nontoxic.  She is interactive and appropriate.  No distress.  Donnell exam is soft.  I have palpated deeply in the upper and lower abdomen without any discomfort.  No guarding.  Nonstick work-up is unremarkable.  At this point time recommendation is to resume PPI for 2 weeks and follow-up with PCP for recheck.  We also discussed the possibility of gallbladder disease with the patient and her family members.  At this time she has no reproducible right upper quadrant pain.  Gallbladder on CT scan is unremarkable and LFTs unremarkable.  I have suggested consideration of right upper quadrant ultrasound if symptoms are not significantly improved by PPI.   Charlesetta Shanks, MD 10/19/21 [phone]

## 2021-10-19 NOTE — ED Triage Notes (Signed)
Pt arrived via POV, c/o right sided abd pain. Denies any n/v or diarrhea.

## 2021-10-19 NOTE — ED Provider Notes (Signed)
[facility] DEPT Provider Note   CSN: [id number] Arrival date & time: 10/19/21  1241     History Chief Complaint  Patient presents with   Abdominal Pain    Angelica [name] is a 81 y.o. female.  HPI Patient is an 81 year old female with a history of lung adenocarcinoma, GERD, hypertension, dementia, who presents to the emergency department with her daughters due to abdominal pain.  They state that this been an ongoing issue for the past 2 to 3 months.  They state that they will typically give her Tylenol or an antacid which will provide short-term relief of her symptoms.  Her symptoms started again this morning and have been progressively worsening.  Patient points to her upper abdomen and states that it is a "burning pain".  Denies any nausea, vomiting, diarrhea, constipation, urinary complaints, chest pain, shortness of breath.    Home Medications Prior to Admission medications   Medication Sig Start Date End Date Taking? Authorizing Provider  acetaminophen (TYLENOL) 500 MG tablet Take 500 mg by mouth every 6 (six) hours as needed for moderate pain.   Yes [provider]  alum & mag hydroxide-simeth (MAALOX/MYLANTA) 200-200-20 MG/5ML suspension Take 15 mLs by mouth every 6 (six) hours as needed for indigestion or heartburn.   Yes [provider]  amLODipine (NORVASC) 10 MG tablet Take 1 tablet (10 mg total) by mouth daily. 06/09/21  Yes [name], MD  Benralizumab [facility]) 30 MG/ML SOSY Inject 1 mL (30 mg total) into the skin every 8 (eight) weeks. 05/23/21  Yes Valentina Shaggy, MD  budesonide-formoterol [facility]) 80-4.5 MCG/ACT inhaler Inhale 2 puffs into the lungs 2 (two) times daily. 04/26/21  Yes Padgett, Rae Halsted, MD  clopidogrel (PLAVIX) 75 MG tablet Take 1 tablet (75 mg total) by mouth daily. 07/20/21  Yes [name], MD  EPINEPHrine (EPIPEN 2-PAK) 0.3 mg/0.3 mL IJ SOAJ injection Use as directed for severe allergic  reaction 01/28/20  Yes Padgett, Rae Halsted, MD  fluticasone [facility]) 50 MCG/ACT nasal spray Place 2 sprays into both nostrils daily. 07/31/21  Yes [provider]  ipratropium (ATROVENT) 0.06 % nasal spray Place 2 sprays into both nostrils 4 (four) times daily as needed (nasal drainage). 04/21/21  Yes Kennith Gain, MD  levothyroxine (SYNTHROID) 50 MCG tablet Take 1 tablet (50 mcg total) by mouth daily before breakfast. 08/16/21  Yes [name], MD  loratadine (CLARITIN) 10 MG tablet Take 10 mg by mouth daily.   Yes [provider]  Magnesium Hydroxide (PHILLIPS MILK OF MAGNESIA PO) Take 15-30 mLs by mouth daily as needed (for constipation).   Yes [provider]  meclizine (ANTIVERT) 25 MG tablet Take 1 tablet (25 mg total) by mouth 2 (two) times daily as needed for dizziness. 07/24/21  Yes Wynona Dove A, DO  memantine (NAMENDA) 5 MG tablet Take 1 tablet (5 mg at night) for 2 weeks, then increase to 1 tablet (5mg ) twice a day 07/20/21  Yes Shawn Route, Coralee Pesa, PA-C  montelukast (SINGULAIR) 10 MG tablet TAKE 1 TABLET BY MOUTH AT BEDTIME 06/23/21  Yes Padgett, Rae Halsted, MD  ondansetron (ZOFRAN) 8 MG tablet Take 1 tablet (8 mg total) by mouth every 8 (eight) hours as needed for nausea or vomiting. 11/02/20  Yes Curt Bears, MD  pantoprazole (PROTONIX) 20 MG tablet Take 1 tablet (20 mg total) by mouth daily. 10/19/21  Yes Rayna Sexton, PA-C  polyethylene glycol (MIRALAX / GLYCOLAX) 17 g packet  Take 17 g by mouth daily as needed for mild constipation.   Yes [provider]  pravastatin (PRAVACHOL) 80 MG tablet Take 1 tablet by mouth once daily 06/23/21  Yes [name], MD  traMADol (ULTRAM) 50 MG tablet Take 50 mg by mouth every 6 (six) hours as needed for moderate pain.   Yes [provider]  acetaminophen (TYLENOL) 325 MG tablet Take 1 tablet (325 mg total) by mouth every 6 (six) hours as needed for headache or mild pain. Patient not  taking: Reported on 10/19/2021 10/22/20   Modena Jansky, MD  diclofenac Sodium (VOLTAREN) 1 % GEL Apply 4 g topically 4 (four) times daily. Patient not taking: Reported on 10/19/2021 01/31/21   Deno Etienne, DO  linaclotide [facility]) 72 MCG capsule TAKE 1 CAPSULE BY MOUTH ONCE DAILY AS NEEDED Patient not taking: Reported on 09/26/2021 06/14/20   [name], MD  memantine (NAMENDA) 10 MG tablet Take 1 tablet (10 mg total) by mouth 2 (two) times daily. Patient not taking: Reported on 10/19/2021 08/07/21   Garvin Fila, MD     Allergies    Asa buff (mag [buffered aspirin], Ensure [nutritional supplements], Fish allergy, Other, Peanut-containing drug products, Penicillins, Shellfish-derived products, and Strawberry extract    Review of Systems   Review of Systems  All other systems reviewed and are negative. Ten systems reviewed and are negative for acute change, except as noted in the HPI.   Physical Exam Updated Vital Signs BP 129/73   Pulse 70   Temp 97.8 F (36.6 C) (Oral)   Resp 15   SpO2 97%  Physical Exam Vitals and nursing note reviewed.  Constitutional:      General: She is not in acute distress.    Appearance: Normal appearance. She is not ill-appearing, toxic-appearing or diaphoretic.  HENT:     Head: Normocephalic and atraumatic.     Right Ear: External ear normal.     Left Ear: External ear normal.     Nose: Nose normal.     Mouth/Throat:     Mouth: Mucous membranes are moist.     Pharynx: Oropharynx is clear. No oropharyngeal exudate or posterior oropharyngeal erythema.  Eyes:     Extraocular Movements: Extraocular movements intact.  Cardiovascular:     Rate and Rhythm: Normal rate and regular rhythm.     Pulses: Normal pulses.     Heart sounds: Normal heart sounds. No murmur heard.   No friction rub. No gallop.  Pulmonary:     Effort: Pulmonary effort is normal. No respiratory distress.     Breath sounds: Normal breath sounds. No stridor. No wheezing, rhonchi or  rales.  Abdominal:     General: Abdomen is flat.     Palpations: Abdomen is soft.     Tenderness: There is abdominal tenderness in the right lower quadrant.     Comments: Abdomen is flat and soft.  Mild tenderness appreciated in the right lower quadrant.  Musculoskeletal:        General: Normal range of motion.     Cervical back: Normal range of motion and neck supple. No tenderness.  Skin:    General: Skin is warm and dry.  Neurological:     General: No focal deficit present.     Mental Status: She is alert and oriented to person, place, and time.  Psychiatric:        Mood and Affect: Mood normal.        Behavior:  Behavior normal.   ED Results / Procedures / Treatments   Labs (all labs ordered are listed, but only abnormal results are displayed) Labs Reviewed  COMPREHENSIVE METABOLIC PANEL - Abnormal; Notable for the following components:      Result Value   Glucose, Bld 106 (*)    BUN 7 (*)    All other components within normal limits  URINALYSIS, ROUTINE W REFLEX MICROSCOPIC - Abnormal; Notable for the following components:   Color, Urine COLORLESS (*)    Specific Gravity, Urine 1.001 (*)    All other components within normal limits  CBC WITH DIFFERENTIAL/PLATELET  LIPASE, BLOOD   EKG None  Radiology CT ABDOMEN PELVIS W CONTRAST  Result Date: 10/19/2021 CLINICAL DATA:  Abdominal pain, acute, nonlocalized. History of lung cancer EXAM: CT ABDOMEN AND PELVIS WITH CONTRAST TECHNIQUE: Multidetector CT imaging of the abdomen and pelvis was performed using the standard protocol following bolus administration of intravenous contrast. RADIATION DOSE REDUCTION: This exam was performed according to the departmental dose-optimization program which includes automated exposure control, adjustment of the mA and/or kV according to patient size and/or use of iterative reconstruction technique. CONTRAST:  134mL OMNIPAQUE IOHEXOL 300 MG/ML  SOLN COMPARISON:  11/07/2020 FINDINGS: Lower chest:  Post treatment changes within the left lower lobe centrally, similar in appearance to previous chest CT. Included lung bases are otherwise clear. Hepatobiliary: No focal liver abnormality is seen. No gallstones, gallbladder wall thickening, or biliary dilatation. Pancreas: Unremarkable. No pancreatic ductal dilatation or surrounding inflammatory changes. Spleen: Normal in size without focal abnormality. Adrenals/Urinary Tract: Unremarkable adrenal glands. Kidneys enhance symmetrically without focal lesion, stone, or hydronephrosis. Bilateral extrarenal pelvis. Ureters are nondilated. Urinary bladder appears unremarkable. Stomach/Bowel: Stomach is within normal limits. Appendix not definitively visualized. No evidence of bowel wall thickening, distention, or inflammatory changes. Vascular/Lymphatic: Scattered aortoiliac atherosclerotic calcifications without aneurysm. No abdominopelvic lymphadenopathy. Reproductive: Status post hysterectomy. No adnexal masses. Other: No free fluid. No abdominopelvic fluid collection. No pneumoperitoneum. No abdominal wall hernia. Musculoskeletal: No acute or significant osseous findings. Similar degree of lower lumbar spondylosis. IMPRESSION: 1. No acute abdominopelvic findings. 2. Stable post treatment changes within the left lower lung. 3. Aortic Atherosclerosis (ICD10-I70.0). Electronically Signed   By: Davina Poke D.O.   On: 10/19/2021 16:05    Procedures Procedures   Medications Ordered in ED Medications  sodium chloride (PF) 0.9 % injection (has no administration in time range)  alum & mag hydroxide-simeth (MAALOX/MYLANTA) 200-200-20 MG/5ML suspension 30 mL (30 mLs Oral Given 10/19/21 1435)    And  lidocaine (XYLOCAINE) 2 % viscous mouth solution 15 mL (15 mLs Oral Given 10/19/21 1435)  iohexol (OMNIPAQUE) 300 MG/ML solution 100 mL (100 mLs Intravenous Contrast Given 10/19/21 1531)    ED Course/ Medical Decision Making/ A&P                           Medical  Decision Making Amount and/or Complexity of Data Reviewed Labs: ordered. Radiology: ordered.  Risk OTC drugs. Prescription drug management.   Pt is a 81 y.o. female who presents to the emergency department with her daughter due to intermittent abdominal pain for the past 2 to 3 months.  Labs: CBC without abnormalities. CMP with a glucose of 106 and a BUN of 7. UA with a decrease specific gravity. Lipase of 27.  Imaging: CT scan of the abdomen/pelvis with contrast shows no acute abdominopelvic findings.  Stable posttreatment changes with the left lower lung.  Aortic  atherosclerosis  I, Rayna Sexton, PA-C, personally reviewed and evaluated these images and lab results as part of my medical decision-making.  On my exam heart is regular rate and rhythm without murmurs, rubs, or gallops.  Lungs are clear to auscultation bilaterally.  Abdomen is flat and soft with mild tenderness noted in the right lower quadrant.  I obtained basic labs as well as a CT scan of her abdomen/pelvis.  Lab work and imaging appears reassuring.  CBC without leukocytosis.  Lipase within normal limits at 27.  No electrolyte derangements noted on CMP.  Normal kidney function.  UA does not appear infectious.  CT scan shows "no acute abdominopelvic findings."  Patient discussed with and evaluated by my attending physician Dr. Johnney Killian.  Will discharge patient on a short course of Protonix.  Recommended PCP follow-up.  My attending physician discussed this plan with the patient as well as her daughter at bedside who are agreeable.  Their questions were answered and they were amicable at the time of discharge  Note: Portions of this report may have been transcribed using voice recognition software. Every effort was made to ensure accuracy; however, inadvertent computerized transcription errors may be present.   Final Clinical Impression(s) / ED Diagnoses Final diagnoses:  Abdominal pain, unspecified abdominal location    Rx / DC Orders ED Discharge Orders          Ordered    pantoprazole (PROTONIX) 20 MG tablet  Daily        10/19/21 1643              Rayna Sexton, PA-C 10/19/21 1705    Charlesetta Shanks, MD 10/20/21 1143

## 2021-10-19 NOTE — Discharge Instructions (Addendum)
I am prescribing you a medication called Protonix.  Please take this once per day for the next 2 weeks.  This medication reduces stomach acid production and should hopefully help with your abdominal pain.  Please follow-up with your primary care provider regarding your symptoms as well as this visit today.  If you develop any new or worsening symptoms please come back to the emergency department immediately.

## 2021-11-10 ENCOUNTER — Ambulatory Visit: Payer: Medicare HMO

## 2021-11-10 ENCOUNTER — Ambulatory Visit (INDEPENDENT_AMBULATORY_CARE_PROVIDER_SITE_OTHER): Payer: Medicare HMO

## 2021-11-10 ENCOUNTER — Other Ambulatory Visit: Payer: Self-pay

## 2021-11-10 ENCOUNTER — Inpatient Hospital Stay: Payer: Medicare HMO | Attending: Internal Medicine

## 2021-11-10 ENCOUNTER — Ambulatory Visit
Admission: RE | Admit: 2021-11-10 | Discharge: 2021-11-10 | Disposition: A | Discharge status: Home / Self Care | Payer: Medicare HMO | Source: Ambulatory Visit | Attending: Internal Medicine | Admitting: Internal Medicine

## 2021-11-10 DIAGNOSIS — C3490 Malignant neoplasm of unspecified part of unspecified bronchus or lung: Secondary | ICD-10-CM

## 2021-11-10 DIAGNOSIS — R911 Solitary pulmonary nodule: Secondary | ICD-10-CM | POA: Diagnosis not present

## 2021-11-10 DIAGNOSIS — I10 Essential (primary) hypertension: Secondary | ICD-10-CM | POA: Diagnosis not present

## 2021-11-10 DIAGNOSIS — I2510 Atherosclerotic heart disease of native coronary artery without angina pectoris: Secondary | ICD-10-CM | POA: Diagnosis not present

## 2021-11-10 DIAGNOSIS — C3432 Malignant neoplasm of lower lobe, left bronchus or lung: Secondary | ICD-10-CM | POA: Insufficient documentation

## 2021-11-10 DIAGNOSIS — J4550 Severe persistent asthma, uncomplicated: Secondary | ICD-10-CM | POA: Diagnosis not present

## 2021-11-10 DIAGNOSIS — J701 Chronic and other pulmonary manifestations due to radiation: Secondary | ICD-10-CM | POA: Diagnosis not present

## 2021-11-10 DIAGNOSIS — Z90710 Acquired absence of both cervix and uterus: Secondary | ICD-10-CM | POA: Insufficient documentation

## 2021-11-10 LAB — CBC WITH DIFFERENTIAL (CANCER CENTER ONLY)
Abs Immature Granulocytes: 0.03 10*3/uL (ref 0.00–0.07)
Basophils Absolute: 0 10*3/uL (ref 0.0–0.1)
Basophils Relative: 1 %
Eosinophils Absolute: 0 10*3/uL (ref 0.0–0.5)
Eosinophils Relative: 0 %
HCT: 40.4 % (ref 36.0–46.0)
Hemoglobin: 13.9 g/dL (ref 12.0–15.0)
Immature Granulocytes: 1 %
Lymphocytes Relative: 25 %
Lymphs Abs: 1.4 10*3/uL (ref 0.7–4.0)
MCH: 30.6 pg (ref 26.0–34.0)
MCHC: 34.4 g/dL (ref 30.0–36.0)
MCV: 89 fL (ref 80.0–100.0)
Monocytes Absolute: 0.5 10*3/uL (ref 0.1–1.0)
Monocytes Relative: 9 %
Neutro Abs: 3.7 10*3/uL (ref 1.7–7.7)
Neutrophils Relative %: 64 %
Platelet Count: 246 10*3/uL (ref 150–400)
RBC: 4.54 MIL/uL (ref 3.87–5.11)
RDW: 14 % (ref 11.5–15.5)
WBC Count: 5.7 10*3/uL (ref 4.0–10.5)
nRBC: 0 % (ref 0.0–0.2)

## 2021-11-10 LAB — CMP (CANCER CENTER ONLY)
ALT: 11 U/L (ref 0–44)
AST: 18 U/L (ref 15–41)
Albumin: 3.9 g/dL (ref 3.5–5.0)
Alkaline Phosphatase: 102 U/L (ref 38–126)
Anion gap: 7 (ref 5–15)
BUN: 9 mg/dL (ref 8–23)
CO2: 26 mmol/L (ref 22–32)
Calcium: 9.7 mg/dL (ref 8.9–10.3)
Chloride: 106 mmol/L (ref 98–111)
Creatinine: 0.8 mg/dL (ref 0.44–1.00)
GFR, Estimated: >60 mL/min (ref >60)
Glucose, Bld: 86 mg/dL (ref 70–99)
Potassium: 3.4 mmol/L — ABNORMAL LOW (ref 3.5–5.1)
Sodium: 139 mmol/L (ref 135–145)
Total Bilirubin: 0.4 mg/dL (ref 0.3–1.2)
Total Protein: 7.4 g/dL (ref 6.5–8.1)

## 2021-11-10 MED ORDER — IOPAMIDOL (ISOVUE-300) INJECTION 61%
75.0000 mL | Freq: Once | INTRAVENOUS | Status: AC | PRN
  Administered 2021-11-10: 75 mL via INTRAVENOUS

## 2021-11-14 ENCOUNTER — Inpatient Hospital Stay (HOSPITAL_BASED_OUTPATIENT_CLINIC_OR_DEPARTMENT_OTHER): Payer: Medicare HMO | Admitting: Internal Medicine

## 2021-11-14 ENCOUNTER — Inpatient Hospital Stay: Payer: Medicare HMO

## 2021-11-14 ENCOUNTER — Other Ambulatory Visit: Payer: Self-pay

## 2021-11-14 VITALS — BP 166/90 | HR 85 | Temp 97.5°F | Resp 16 | Wt 155.6 lb

## 2021-11-14 DIAGNOSIS — Z95828 Presence of other vascular implants and grafts: Secondary | ICD-10-CM

## 2021-11-14 DIAGNOSIS — I10 Essential (primary) hypertension: Secondary | ICD-10-CM | POA: Diagnosis not present

## 2021-11-14 DIAGNOSIS — C3490 Malignant neoplasm of unspecified part of unspecified bronchus or lung: Secondary | ICD-10-CM

## 2021-11-14 DIAGNOSIS — C3492 Malignant neoplasm of unspecified part of left bronchus or lung: Secondary | ICD-10-CM

## 2021-11-14 DIAGNOSIS — C3432 Malignant neoplasm of lower lobe, left bronchus or lung: Secondary | ICD-10-CM | POA: Diagnosis not present

## 2021-11-14 DIAGNOSIS — Z90710 Acquired absence of both cervix and uterus: Secondary | ICD-10-CM | POA: Diagnosis not present

## 2021-11-14 MED ORDER — HEPARIN SOD (PORK) LOCK FLUSH 100 UNIT/ML IV SOLN
500.0000 [IU] | Freq: Once | INTRAVENOUS | Status: AC
  Administered 2021-11-14: 500 [IU]

## 2021-11-14 MED ORDER — SODIUM CHLORIDE 0.9% FLUSH
10.0000 mL | Freq: Once | INTRAVENOUS | Status: AC
  Administered 2021-11-14: 10 mL

## 2021-11-16 ENCOUNTER — Ambulatory Visit (INDEPENDENT_AMBULATORY_CARE_PROVIDER_SITE_OTHER): Payer: Medicare HMO | Admitting: *Deleted

## 2021-11-16 ENCOUNTER — Encounter: Payer: Self-pay | Admitting: *Deleted

## 2021-11-16 DIAGNOSIS — I10 Essential (primary) hypertension: Secondary | ICD-10-CM

## 2021-11-16 DIAGNOSIS — C3492 Malignant neoplasm of unspecified part of left bronchus or lung: Secondary | ICD-10-CM

## 2021-11-16 NOTE — Chronic Care Management (AMB) (Signed)
Chronic Care Management   CCM RN Visit Note  11/16/2021 Name: Angelica [name] MRN: [id number] DOB: 07/08/1940  Subjective: Angelica [name] is a 81 y.o. year old female who is a primary care patient of [name], MD. The care management team was consulted for assistance with disease management and care coordination needs.    Engaged with patient's daughter/ caregiver Taron by telephone for follow up visit/ CCM RN CM case closure in response to provider referral for case management and/or care coordination services.   Consent to Services:  The patient was given information about Chronic Care Management services, agreed to services, and gave verbal consent prior to initiation of services.  Please see initial visit note for detailed documentation.  Patient agreed to services and verbal consent obtained.   Assessment: Review of patient past medical history, allergies, medications, health status, including review of consultants reports, laboratory and other test data, was performed as part of comprehensive evaluation and provision of chronic care management services.   SDOH (Social Determinants of Health) assessments and interventions performed:  SDOH Interventions    Flowsheet Row Most Recent Value  SDOH Interventions   Food Insecurity Interventions Intervention Not Indicated  [daughter/ caregiver continues to deny food insecurity]  Housing Interventions Intervention Not Indicated  [patient continues to reside with daughter/ caregiver,  caregiver denies housing concerns]  Transportation Interventions Intervention Not Indicated  [daughter/ caregiver continues to provide transportation]     CCM Care Plan  Allergies  Allergen Reactions   Asa Buff (Mag [Buffered Aspirin] Other (See Comments)    Excessive sweating   Ensure [Nutritional Supplements]     Or boost - upset stomach    Fish Allergy Other (See Comments)    Unknown reaction, but allergic   Other Other (See Comments)     Gelcaps; gel-containing capsules; extended-release medication - upset stomach    Peanut-Containing Drug Products Other (See Comments)    From allergy test   Penicillins Itching    Has patient had a PCN reaction causing immediate rash, facial/tongue/throat swelling, SOB or lightheadedness with hypotension: No Has patient had a PCN reaction causing severe rash involving mucus membranes or skin necrosis: No Has patient had a PCN reaction that required hospitalization No Has patient had a PCN reaction occurring within the last 10 years: No If all of the above answers are "NO", then may proceed with Cephalosporin use.   Shellfish-Derived Products Other (See Comments)    From allergy test   Strawberry Extract Other (See Comments)    From allergy test    Outpatient Encounter Medications as of 11/16/2021  Medication Sig   acetaminophen (TYLENOL) 325 MG tablet Take 1 tablet (325 mg total) by mouth every 6 (six) hours as needed for headache or mild pain. (Patient not taking: Reported on 10/19/2021)   acetaminophen (TYLENOL) 500 MG tablet Take 500 mg by mouth every 6 (six) hours as needed for moderate pain.   alum & mag hydroxide-simeth (MAALOX/MYLANTA) 200-200-20 MG/5ML suspension Take 15 mLs by mouth every 6 (six) hours as needed for indigestion or heartburn.   amLODipine (NORVASC) 10 MG tablet Take 1 tablet (10 mg total) by mouth daily.   Benralizumab (FASENRA) 30 MG/ML SOSY Inject 1 mL (30 mg total) into the skin every 8 (eight) weeks.   budesonide-formoterol (SYMBICORT) 80-4.5 MCG/ACT inhaler Inhale 2 puffs into the lungs 2 (two) times daily.   clopidogrel (PLAVIX) 75 MG tablet Take 1 tablet (75 mg total) by mouth daily.   diclofenac Sodium (  VOLTAREN) 1 % GEL Apply 4 g topically 4 (four) times daily. (Patient not taking: Reported on 10/19/2021)   EPINEPHrine (EPIPEN 2-PAK) 0.3 mg/0.3 mL IJ SOAJ injection Use as directed for severe allergic reaction   fluticasone (FLONASE) 50 MCG/ACT nasal spray Place 2  sprays into both nostrils daily.   ipratropium (ATROVENT) 0.06 % nasal spray Place 2 sprays into both nostrils 4 (four) times daily as needed (nasal drainage).   levothyroxine (SYNTHROID) 50 MCG tablet Take 1 tablet (50 mcg total) by mouth daily before breakfast.   linaclotide (LINZESS) 72 MCG capsule TAKE 1 CAPSULE BY MOUTH ONCE DAILY AS NEEDED (Patient not taking: Reported on 09/26/2021)   loratadine (CLARITIN) 10 MG tablet Take 10 mg by mouth daily.   Magnesium Hydroxide (PHILLIPS MILK OF MAGNESIA PO) Take 15-30 mLs by mouth daily as needed (for constipation).   meclizine (ANTIVERT) 25 MG tablet Take 1 tablet (25 mg total) by mouth 2 (two) times daily as needed for dizziness.   memantine (NAMENDA) 10 MG tablet Take 1 tablet (10 mg total) by mouth 2 (two) times daily. (Patient not taking: Reported on 10/19/2021)   memantine (NAMENDA) 5 MG tablet Take 1 tablet (5 mg at night) for 2 weeks, then increase to 1 tablet ($RemoveB'5mg'[id number]$ ) twice a day   montelukast (SINGULAIR) 10 MG tablet TAKE 1 TABLET BY MOUTH AT BEDTIME   ondansetron (ZOFRAN) 8 MG tablet Take 1 tablet (8 mg total) by mouth every 8 (eight) hours as needed for nausea or vomiting.   pantoprazole (PROTONIX) 20 MG tablet Take 1 tablet (20 mg total) by mouth daily.   polyethylene glycol (MIRALAX / GLYCOLAX) 17 g packet Take 17 g by mouth daily as needed for mild constipation.   pravastatin (PRAVACHOL) 80 MG tablet Take 1 tablet by mouth once daily   traMADol (ULTRAM) 50 MG tablet Take 50 mg by mouth every 6 (six) hours as needed for moderate pain.   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   Anosmia 05/01/2021   COVID-19 04/25/2021   Debility  02/01/2021   Gait disorder 02/01/2021   Malignant neoplasm of bronchus and lung ([place])    Port-A-Cath in place 11/02/2020   Adenocarcinoma of left lung, stage 3 ([place]) 06/22/2020   Lung mass    Pulmonary nodule 05/17/2020   Iron deficiency 05/03/2020   Swelling of lower extremity 07/30/2019   Ganglion cyst of right foot 01/16/2019   HLD (hyperlipidemia) 01/16/2019   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 pylori infection    Other allergic rhinitis 03/20/2016   Malnutrition of moderate degree 04/11/2015   HTN (hypertension) 04/10/2015   Conditions to be addressed/monitored:  HTN and cancer  Care Plan : RN Care Manager Plan of Care  Updates made by Knox Royalty, RN since 11/16/2021 12:00 AM     Problem: Chronic Disease Management Needs   Priority: Medium     Long-Range Goal: Ongoing adherence to established plan of care for long term chronic disease  management   Start Date: 12/26/2020  Expected End Date: 12/26/2021  Priority: Medium  Note:   Current Barriers:  Chronic Disease Management support and education needs related to HTN and lung cancer New COVID positive test 04/25/21- caregiver reports COVID symptoms have resolved (05/31/21)  RNCM Clinical Goal(s):  Patient will demonstrate ongoing adherence to prescribed treatment plan for HTN and cancer as evidenced by adherence to prescribed medication regimen contacting provider for new or worsened symptoms or questions attending all scheduled provider appointments, monitoring blood pressure at home weekly  through collaboration with RN Care manager, provider, and care team.   Interventions: 1:1 collaboration with primary care provider regarding development and update of comprehensive  plan of care as evidenced by provider attestation and co-signature Inter-disciplinary care team collaboration (see longitudinal plan of care) Evaluation of current treatment plan related to  self management and patient's adherence to plan as established by provider Review of patient status, including review of consultants reports, relevant laboratory and other test results, and medications completed SDOH updated: no new/ unmet concerns identified Depression screening updated: no concerns identified Pain assessment updated: caregiver denies that patient is experiencing pain Falls assessment updated: caregiver continues to deny new/ recent falls x 12 months- not currently using assistive devices;  positive reinforcement provided with encouragement to continue efforts at fall prevention; previously provided education around fall risks/ prevention reinforced Medications discussed: caregiver reports that she continues to manage all aspects of patient's medications and denies current concerns/ issues/ questions around medications; endorses adherence to taking all medications as prescribed Reviewed recent ED visit 10/19/21 for abdominal pain: caregiver confirms abdominal pain has subsided; denies ongoing concerns Reviewed recent oncology provider office visit- caregiver reports "went great;" patient continues to be followed for surveillance/ immunotherapy treatments; she verbalizes good understanding of post-visit instructions and denies questions Reviewed upcoming scheduled provider appointments: 01/05/22- immunotherapy treatment; 01/10/22- PCP; caregiver confirms is aware of all and has plans to attend as scheduled Discussed plans with patient for ongoing care management follow up- caregiver denies current care coordination/ care management needs and is agreeable to CCM RN CM case closure today; verbalizes understanding to contact PCP or other care providers for any needs that arise in the future, and confirms  she has contact information for all care providers     Hypertension/ CVA: (Status: 11/16/21: Goal Met.) Last practice recorded BP readings:  BP Readings from Last 3 Encounters:  11/14/21 (!) 166/90  10/19/21 138/86  08/07/21 (!) 156/82  Most recent eGFR/CrCl: No results found for: EGFR  No components found for: CRCL  Evaluation of current treatment plan related to hypertension self management and patient's adherence to plan as established by provider;   Reviewed prescribed diet low salt, heart healthy - caregiver reports patient has "great" appetite Confirmed caregiver has not regularly been monitoring/ recording blood pressures since our last outreach, but tells me today that when they had recent oncology provider office visit 11/14/21, patient's BP was "higher than normal;" discussed mechanism of blood pressure fluctuations for activity levels and encouraged caregiver to resume blood pressure monitoring at home, several times per week, to alternate monitoring between periods at rest and during activity-- encouraged caregiver to write all values down and to designate whether values obtained are during rest/ activity-- and to take blood pressure values to upcoming PCP office visits Reviewed recent blood pressures from provider office visits/ ED visit 10/19/21 Confirmed patient continues amlodipine only QD for blood pressure management-- she has had episodes in the past of hypotension  when she was on additional medications Reviewed/ reinforced previously provided education around standard/ general blood goals Encouraged caregiver to contact PCP office should she have concerns with blood pressure values at home once she resumes blood pressure monitoring- she will do Confirmed patient has been active at baseline; she goes out with her other daughter when [name] is working during day- ongoing activity encouraged  Lung Cancer  (Status: 11/16/21: Goal Met.) Evaluation of current treatment plan related to   lung cancer ,  self-management and patient's adherence to plan as established by provider. Confirmed patient continues being followed regularly by oncology provider; patient has been released for surveillance care- and continues to receive immunotherapy treatment at cancer center  Patient Goals/Self-Care Activities: As evidenced by review of EHR, collaboration with care team, and patient reporting during CCM RN CM outreach, Patient Ryna/ Caregiver [name] will ensure that patient will: Take medications as prescribed Attend all scheduled provider appointments Call pharmacy for medication refills Call provider office for new concerns or questions Resume monitoring blood pressures at home several times each week; measure blood pressures both when Diamantina is at rest and alternate when she is active: write these values down on paper so you can share with Dr. Jenny Reichmann when you have your next office visit Continue to follow low-salt heart healthy diet    Continue to stay as active as possible Continue to take efforts to prevent falls       Plan: No further follow up required: caregiver denies current care coordination/ care management needs and is agreeable to CCM RN CM case closure today; CCM RN CM case closure today accordingly     [name], RN, BSN, [place] [phone]: direct office

## 2021-11-16 NOTE — Patient Instructions (Signed)
Visit [place], thank you for taking time to talk with me today about Angelica [name]'s health care needs. I have enjoyed working with you and Angelica [name] and wish you both the very best in the future  Below are the goals we discussed today:  Patient Self-Care Activities: Patient Angelica [name]/ Caregiver Angelica [name] will ensure that patient will: Take medications as prescribed Attend all scheduled provider appointments Call pharmacy for medication refills Call provider office for new concerns or questions Resume monitoring blood pressures at home several times each week; measure blood pressures both when Angelica [name] is at rest and alternate when she is active: write these values down on paper so you can share with Dr. Jenny Reichmann when you have your next office visit Continue to follow low-salt heart healthy diet    Continue to stay as active as possible Continue to take efforts to prevent falls   Angelica Rack, RN, BSN, [place] [phone]: direct office  If you are experiencing a Mental Health or [place] or need someone to talk to, please  call the Suicide and Crisis Lifeline: 988 call the [place] National Suicide Prevention Lifeline: (781) 337-2683 or TTY: 2565432000 TTY (361)213-5822) to talk to a trained counselor call 1-800-273-TALK (toll free, 24 hour hotline) go to [facility] [place] [phone]) call 911   Caregiver verbalizes understanding of instructions and care plan provided today and agrees to view in [place]. Active MyChart status and patient understanding of how to access instructions and care plan via MyChart confirmed with caregiver  Hypertension, Adult High blood pressure (hypertension) is when the force of blood pumping through the arteries is too strong. The arteries are the blood vessels that carry blood from the heart throughout the body. Hypertension  forces the heart to work harder to pump blood and may cause arteries to become narrow or stiff. Untreated or uncontrolled hypertension can lead to a heart attack, heart failure, a stroke, kidney disease, and other problems. A blood pressure reading consists of a higher number over a lower number. Ideally, your blood pressure should be below 120/80. The first ("top") number is called the systolic pressure. It is a measure of the pressure in your arteries as your heart beats. The second ("bottom") number is called the diastolic pressure. It is a measure of the pressure in your arteries as the heart relaxes. What are the causes? The exact cause of this condition is not known. There are some conditions that result in high blood pressure. What increases the risk? Certain factors may make you more likely to develop high blood pressure. Some of these risk factors are under your control, including: Smoking. Not getting enough exercise or physical activity. Being overweight. Having too much fat, sugar, calories, or salt (sodium) in your diet. Drinking too much alcohol. Other risk factors include: Having a personal history of heart disease, diabetes, high cholesterol, or kidney disease. Stress. Having a family history of high blood pressure and high cholesterol. Having obstructive sleep apnea. Age. The risk increases with age. What are the signs or symptoms? High blood pressure may not cause symptoms. Very high blood pressure (hypertensive crisis) may cause: Headache. Fast or irregular heartbeats (palpitations). Shortness of breath. Nosebleed. Nausea and vomiting. Vision changes. Severe chest pain, dizziness, and seizures. How is this diagnosed? This condition is diagnosed by measuring your blood pressure while you are seated, with your arm resting on a flat surface, your legs uncrossed,  and your feet flat on the floor. The cuff of the blood pressure monitor will be placed directly against the skin  of your upper arm at the level of your heart. Blood pressure should be measured at least twice using the same arm. Certain conditions can cause a difference in blood pressure between your right and left arms. If you have a high blood pressure reading during one visit or you have normal blood pressure with other risk factors, you may be asked to: Return on a different day to have your blood pressure checked again. Monitor your blood pressure at home for 1 week or longer. If you are diagnosed with hypertension, you may have other blood or imaging tests to help your health care provider understand your overall risk for other conditions. How is this treated? This condition is treated by making healthy lifestyle changes, such as eating healthy foods, exercising more, and reducing your alcohol intake. You may be referred for counseling on a healthy diet and physical activity. Your health care provider may prescribe medicine if lifestyle changes are not enough to get your blood pressure under control and if: Your systolic blood pressure is above 130. Your diastolic blood pressure is above 80. Your personal target blood pressure may vary depending on your medical conditions, your age, and other factors. Follow these instructions at home: Eating and drinking  Eat a diet that is high in fiber and potassium, and low in sodium, added sugar, and fat. An example of this eating plan is called the DASH diet. DASH stands for Dietary Approaches to Stop Hypertension. To eat this way: Eat plenty of fresh fruits and vegetables. Try to fill one half of your plate at each meal with fruits and vegetables. Eat whole grains, such as whole-wheat pasta, brown rice, or whole-grain bread. Fill about one fourth of your plate with whole grains. Eat or drink low-fat dairy products, such as skim milk or low-fat yogurt. Avoid fatty cuts of meat, processed or cured meats, and poultry with skin. Fill about one fourth of your plate  with lean proteins, such as fish, chicken without skin, beans, eggs, or tofu. Avoid pre-made and processed foods. These tend to be higher in sodium, added sugar, and fat. Reduce your daily sodium intake. Many people with hypertension should eat less than 1,500 mg of sodium a day. Do not drink alcohol if: Your health care provider tells you not to drink. You are pregnant, may be pregnant, or are planning to become pregnant. If you drink alcohol: Limit how much you have to: 0-1 drink a day for women. 0-2 drinks a day for men. Know how much alcohol is in your drink. In the [place] one drink equals one 12 oz bottle of beer (355 mL), one 5 oz glass of wine (148 mL), or one 1 oz glass of hard liquor (44 mL). Lifestyle  Work with your health care provider to maintain a healthy body weight or to lose weight. Ask what an ideal weight is for you. Get at least 30 minutes of exercise that causes your heart to beat faster (aerobic exercise) most days of the week. Activities may include walking, swimming, or biking. Include exercise to strengthen your muscles (resistance exercise), such as Pilates or lifting weights, as part of your weekly exercise routine. Try to do these types of exercises for 30 minutes at least 3 days a week. Do not use any products that contain nicotine or tobacco. These products include cigarettes, chewing tobacco, and vaping devices,  such as e-cigarettes. If you need help quitting, ask your health care provider. Monitor your blood pressure at home as told by your health care provider. Keep all follow-up visits. This is important. Medicines Take over-the-counter and prescription medicines only as told by your health care provider. Follow directions carefully. Blood pressure medicines must be taken as prescribed. Do not skip doses of blood pressure medicine. Doing this puts you at risk for problems and can make the medicine less effective. Ask your health care provider about side effects  or reactions to medicines that you should watch for. Contact a health care provider if you: Think you are having a reaction to a medicine you are taking. Have headaches that keep coming back (recurring). Feel dizzy. Have swelling in your ankles. Have trouble with your vision. Get help right away if you: Develop a severe headache or confusion. Have unusual weakness or numbness. Feel faint. Have severe pain in your chest or abdomen. Vomit repeatedly. Have trouble breathing. These symptoms may be an emergency. Get help right away. Call 911. Do not wait to see if the symptoms will go away. Do not drive yourself to the hospital. Summary Hypertension is when the force of blood pumping through your arteries is too strong. If this condition is not controlled, it may put you at risk for serious complications. Your personal target blood pressure may vary depending on your medical conditions, your age, and other factors. For most people, a normal blood pressure is less than 120/80. Hypertension is treated with lifestyle changes, medicines, or a combination of both. Lifestyle changes include losing weight, eating a healthy, low-sodium diet, exercising more, and limiting alcohol. This information is not intended to replace advice given to you by your health care provider. Make sure you discuss any questions you have with your health care provider. Document Revised: 03/14/2021 Document Reviewed: 03/14/2021 Elsevier Patient Education  [place].

## 2021-11-17 DIAGNOSIS — I10 Essential (primary) hypertension: Secondary | ICD-10-CM

## 2021-11-17 DIAGNOSIS — C3492 Malignant neoplasm of unspecified part of left bronchus or lung: Secondary | ICD-10-CM

## 2021-11-29 ENCOUNTER — Telehealth: Payer: Self-pay | Admitting: Internal Medicine

## 2021-11-29 NOTE — Telephone Encounter (Signed)
Called patient regarding all upcoming appointments, patient has been called and voicemail was left.

## 2022-01-04 ENCOUNTER — Other Ambulatory Visit: Payer: Self-pay | Admitting: Allergy

## 2022-01-05 ENCOUNTER — Other Ambulatory Visit: Payer: Self-pay | Admitting: *Deleted

## 2022-01-05 ENCOUNTER — Ambulatory Visit: Payer: Medicare HMO

## 2022-01-05 ENCOUNTER — Telehealth: Payer: Self-pay | Admitting: Allergy

## 2022-01-05 MED ORDER — SYMBICORT 80-4.5 MCG/ACT IN AERO: INHALATION_SPRAY | RESPIRATORY_TRACT | 5 refills | Status: DC

## 2022-01-05 MED ORDER — IPRATROPIUM BROMIDE 0.06 % NA SOLN: 2.0000 | Freq: Four times a day (QID) | NASAL | 5 refills | Status: DC | PRN

## 2022-01-05 MED ORDER — FLUTICASONE PROPIONATE 50 MCG/ACT NA SUSP: 1.0000 | Freq: Two times a day (BID) | NASAL | 5 refills | Status: DC

## 2022-01-05 NOTE — Telephone Encounter (Signed)
Refills have been sent in to get her through until her appointment. Called and left a detailed voicemail per [facility] permission.

## 2022-01-05 NOTE — Telephone Encounter (Signed)
Patients daughter is requesting a refill on Symbicort, Ipratropium and Flonase sent to [facility]  on [address]. Dr. Jeralyn Ruths next available OV isn't until November which she scheduled for 11/3. She is wondering if patient could have a courtesy refill to hold her until then.

## 2022-01-08 ENCOUNTER — Ambulatory Visit (INDEPENDENT_AMBULATORY_CARE_PROVIDER_SITE_OTHER): Payer: Medicare HMO | Admitting: *Deleted

## 2022-01-08 DIAGNOSIS — J4550 Severe persistent asthma, uncomplicated: Secondary | ICD-10-CM

## 2022-01-10 ENCOUNTER — Ambulatory Visit: Payer: Medicare HMO | Admitting: Internal Medicine

## 2022-01-16 ENCOUNTER — Inpatient Hospital Stay: Payer: Medicare HMO | Attending: Internal Medicine

## 2022-01-23 ENCOUNTER — Encounter: Payer: Self-pay | Admitting: Physician Assistant

## 2022-01-23 ENCOUNTER — Ambulatory Visit: Payer: Medicare HMO | Admitting: Physician Assistant

## 2022-01-23 DIAGNOSIS — Z029 Encounter for administrative examinations, unspecified: Secondary | ICD-10-CM

## 2022-01-23 NOTE — Progress Notes (Deleted)
Assessment/Plan:   Dementia likely due to  Angelica [name] is a very pleasant 81 y.o. 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  Past MRI of the brain 07/18/2021 was remarkable for mild cerebral and cerebellar atrophy, chronic small vessel disease and acute infarcts within the right cerebellar hemisphere.  Last MoCA on 07/20/2021 was significant for/30.  The patient is on memantine 10 mg twice daily.      Follow up in   months.   Case discussed with Dr. Delice Lesch who agrees with the plan     Subjective:    This patient is accompanied in the office by  who supplements the history.  Previous records as well as any outside records available were reviewed prior to todays visit.    Any changes in memory since last visit? She enjoys doing crossword puzzles, word finding, and watching TV Patient lives with: Her daughter repeats oneself?  Endorsed, for example "do we have an appointment this morning?  Or have an appointment this morning?" Disoriented when walking into a room?  Patient denies   Leaving objects in unusual places?  Patient denies   Ambulates  with difficulty?   Patient denies   Recent falls?  Patient denies   Any head injuries?  Patient denies   History of seizures?   Patient denies   Wandering behavior?  Patient denies   Patient drives?   Patient no longer drives  Any mood changes since last visit?  Patient denies   Any worsening depression?:  Patient denies   Hallucinations?  Patient denies   Paranoia?  Patient denies   Patient reports that sleeps well without vivid dreams, REM behavior or sleepwalking   History of sleep apnea?  Patient denies   Any hygiene concerns?  Patient denies    Independent of bathing and dressing?  Endorsed  Does the patient needs help with medications?  Daughter is in charge. Who is in charge of the finances?  Daughter is in charge    Any changes in appetite?  Patient denies   Patient have trouble swallowing? Patient denies   Does the patient cook?  Patient denies   Any kitchen accidents such as leaving the stove on? Patient denies   Any headaches?  Patient denies   Double vision? Patient denies   Any focal numbness or tingling?  Patient denies   Chronic back pain Patient denies   Unilateral weakness?  Patient denies   Any tremors?  Patient denies   Any history of anosmia?  Patient denies   Any incontinence of urine?  Patient denies   Any bowel dysfunction?   Patient denies       Initial visit 07/20/21  The patient is seen in neurologic consultation at the request of [name], MD for the evaluation of memory.  The patient is accompanied by her daughter who supplements the history. This is a 81 y.o. year old RH  female who has had memory issues for at least 1 year, especially noticeable after beginning chemotherapy in January 2022.  Memory difficulties are worse over the last 6 months, when she began to repeat herself.  For example, her daughter reports that today, she asked her several times "did I take the medication this morning? Or "do we  have an appointment this morning?  Short-term memory is worse than her long-term memory.  She denies being disoriented when walking into her room, or leaving objects in unusual places.  She ambulates without difficulty, denies any falls or head injuries.  Denies wandering off.  She does not drive anymore "since the cancer I am lazy and I do not want to drive ".  She lives with her daughter.  Her mood is good, denies any depression or irritability.  During the day, she enjoys doing crossword puzzles, word finding, watching TV.  She reports sleeping "great ".  She denies any vivid dreams or sleepwalking.  Denies  hallucinations or paranoia.  There are no hygiene concerns, she is independent of bathing and dressing.  The medications are placed on the pillbox, and she denies forgetting any doses.  Her daughter monitors closely.  Her daughter is in charge of the finances.  Her appetite is good, denies trouble swallowing.  She cooks and denies leaving the stove on.  A while ago, she left the faucet on accidentally and flooded the whole bathroom.  She denies any headaches, double vision, dizziness, focal numbness or tingling, unilateral weakness, tremors or anosmia.  In December 2021 she had Covid without requiring hospitalization.  No history of seizures. Denies urine incontinence, retention, constipation or diarrhea.  Denies OSA, ETOH or Tobacco. Family History Fa had Alzheimer's disease   Labs 07/18/2021 showed TSH 3.94, T4 0.84 B12 1036, vitamin D 56.24, and A1c 5.8, normal hepatic function panel, lipid panel and CBC.   MRI of the brain without contrast on 07/18/2021 was remarkable for mild to moderate chronic small vessel ischemic changes within the cerebral white matter, slightly progressing from prior brain MRI on 06/14/2020, small chronic infarcts within the right cerebellar hemisphere, which is new from the prior MRI, mild generalized cerebral and cerebellar atrophy, no acute findings.  PREVIOUS MEDICATIONS:   CURRENT MEDICATIONS:  Outpatient Encounter Medications as of 01/23/2022  Medication Sig   acetaminophen (TYLENOL) 325 MG tablet Take 1 tablet (325 mg total) by mouth every 6 (six) hours as needed for headache or mild pain. (Patient not taking: Reported on 10/19/2021)   acetaminophen (TYLENOL) 500 MG tablet Take 500 mg by mouth every 6 (six) hours as needed for moderate pain.   alum & mag hydroxide-simeth (MAALOX/MYLANTA) 200-200-20 MG/5ML suspension Take 15 mLs by mouth every 6 (six) hours as needed for indigestion or heartburn.   amLODipine (NORVASC) 10 MG tablet Take 1 tablet (10 mg total) by mouth daily.    Benralizumab (FASENRA) 30 MG/ML SOSY Inject 1 mL (30 mg total) into the skin every 8 (eight) weeks.   clopidogrel (PLAVIX) 75 MG tablet Take 1 tablet (75 mg total) by mouth daily.   diclofenac Sodium (VOLTAREN) 1 % GEL Apply 4 g topically 4 (four) times daily. (Patient not taking: Reported on 10/19/2021)   EPINEPHrine (EPIPEN 2-PAK) 0.3 mg/0.3 mL IJ SOAJ injection Use as directed for severe allergic reaction   fluticasone (FLONASE) 50 MCG/ACT nasal spray Place 1 spray into both nostrils in the morning and at bedtime.   ipratropium (ATROVENT) 0.06 % nasal spray Place 2 sprays into both nostrils 4 (four) times daily as needed (nasal drainage).   levothyroxine (SYNTHROID) 50 MCG tablet Take 1 tablet (50 mcg total) by mouth daily before breakfast.   linaclotide (LINZESS) 72 MCG capsule TAKE 1 CAPSULE BY MOUTH ONCE DAILY AS NEEDED (Patient not taking: Reported on 09/26/2021)   loratadine (CLARITIN) 10 MG tablet Take 10  mg by mouth daily.   Magnesium Hydroxide (PHILLIPS MILK OF MAGNESIA PO) Take 15-30 mLs by mouth daily as needed (for constipation).   meclizine (ANTIVERT) 25 MG tablet Take 1 tablet (25 mg total) by mouth 2 (two) times daily as needed for dizziness.   memantine (NAMENDA) 10 MG tablet Take 1 tablet (10 mg total) by mouth 2 (two) times daily. (Patient not taking: Reported on 10/19/2021)   memantine (NAMENDA) 5 MG tablet Take 1 tablet (5 mg at night) for 2 weeks, then increase to 1 tablet (5mg ) twice a day   montelukast (SINGULAIR) 10 MG tablet TAKE 1 TABLET BY MOUTH AT BEDTIME   ondansetron (ZOFRAN) 8 MG tablet Take 1 tablet (8 mg total) by mouth every 8 (eight) hours as needed for nausea or vomiting.   pantoprazole (PROTONIX) 20 MG tablet Take 1 tablet (20 mg total) by mouth daily.   polyethylene glycol (MIRALAX / GLYCOLAX) 17 g packet Take 17 g by mouth daily as needed for mild constipation.   pravastatin (PRAVACHOL) 80 MG tablet Take 1 tablet by mouth once daily   SYMBICORT 80-4.5 MCG/ACT  inhaler INHALE 2 PUFFS BY MOUTH IN THE MORNING AND AT BEDTIME   traMADol (ULTRAM) 50 MG tablet Take 50 mg by mouth every 6 (six) hours as needed for moderate pain.   Facility-Administered Encounter Medications as of 01/23/2022  Medication   Benralizumab SOSY 30 mg       08/07/2021   11:18 AM  MMSE - Mini Mental State Exam  Orientation to time 2  Orientation to Place 5  Registration 3  Attention/ Calculation 0  Recall 0  Language- name 2 objects 2  Language- repeat 1  Language- follow 3 step command 3  Language- read & follow direction 1  Write a sentence 1  Copy design 0  Copy design-comments Four-legged animals x 1 min: 11  Total score 18      07/20/2021    8:00 AM  Montreal Cognitive Assessment   Visuospatial/ Executive (0/5) 1  Naming (0/3) 3  Attention: Read list of digits (0/2) 2  Attention: Read list of letters (0/1) 1  Attention: Serial 7 subtraction starting at 100 (0/3) 0  Language: Repeat phrase (0/2) 1  Language : Fluency (0/1) 0  Abstraction (0/2) 1  Delayed Recall (0/5) 0  Orientation (0/6) 1  Total 10  Adjusted Score (based on education) 11    Objective:     PHYSICAL EXAMINATION:    VITALS:  There were no vitals filed for this visit.  GEN:  The patient appears stated age and is in NAD. HEENT:  Normocephalic, atraumatic.   Neurological examination:  General: NAD, well-groomed, appears stated age. Orientation: The patient is alert. Oriented to person, not to place or time. cranial nerves: There is good facial symmetry.The speech is fluent and clear. No aphasia or dysarthria. Fund of knowledge is reduced recent and remote memory are impaired. Attention and concentration are reduced.  Able to name objects and repeat phrases.  Hearing is intact to conversational tone.    Sensation: Sensation is intact to light touch throughout Motor: Strength is at least antigravity x4. Tremors: none  DTR's 2/4 in UE/LE     Movement examination: Tone: There is  normal tone in the UE/LE Abnormal movements:  no tremor.  No myoclonus.  No asterixis.   Coordination:  There is no decremation with RAM's. Normal finger to nose  Gait and Station: The patient has no difficulty arising out of a  deep-seated chair without the use of the hands. The patient's stride length is good.  Gait is cautious and narrow.    Thank you for allowing [place] the opportunity to participate in the care of this nice patient. Please do not hesitate to contact us for any questions or concerns.   Total time spent on today's visit was *** minutes dedicated to this patient today, preparing to see patient, examining the patient, ordering tests and/or medications and counseling the patient, documenting clinical information in the EHR or other health record, independently interpreting results and communicating results to the patient/family, discussing treatment and goals, answering patient's questions and coordinating care.  Cc:  [name], MD  Sharene Butters 01/23/2022 6:49 AM

## 2022-02-14 ENCOUNTER — Ambulatory Visit (INDEPENDENT_AMBULATORY_CARE_PROVIDER_SITE_OTHER): Payer: Medicare HMO | Admitting: Allergy

## 2022-02-14 ENCOUNTER — Encounter: Payer: Self-pay | Admitting: Allergy

## 2022-02-14 ENCOUNTER — Other Ambulatory Visit: Payer: Self-pay

## 2022-02-14 VITALS — BP 132/74 | HR 79 | Temp 98.3°F | Resp 15 | Wt 149.8 lb

## 2022-02-14 DIAGNOSIS — L509 Urticaria, unspecified: Secondary | ICD-10-CM | POA: Diagnosis not present

## 2022-02-14 DIAGNOSIS — J339 Nasal polyp, unspecified: Secondary | ICD-10-CM | POA: Diagnosis not present

## 2022-02-14 DIAGNOSIS — K219 Gastro-esophageal reflux disease without esophagitis: Secondary | ICD-10-CM | POA: Diagnosis not present

## 2022-02-14 DIAGNOSIS — J310 Chronic rhinitis: Secondary | ICD-10-CM

## 2022-02-14 DIAGNOSIS — J4550 Severe persistent asthma, uncomplicated: Secondary | ICD-10-CM | POA: Diagnosis not present

## 2022-02-14 MED ORDER — MONTELUKAST SODIUM 10 MG PO TABS: 10.0000 mg | ORAL_TABLET | Freq: Every day | ORAL | 1 refills | Status: DC

## 2022-02-14 MED ORDER — CETIRIZINE HCL 10 MG PO TABS: 10.0000 mg | ORAL_TABLET | Freq: Every day | ORAL | 1 refills | Status: DC

## 2022-02-14 NOTE — Patient Instructions (Signed)
  1. Continue Symbicort 2 puffs twice a day for asthma maintenance control  2. Continue Fasenra injections every 8 weeks.     3. Use Ipratropium 0.06% 2 sprays each nostril as needed for nasal drainage  4. Continue Singulair.   Change Claritin to Zyrtec 10mg  at this time  5.  If needed:   A.  Ventolin HFA 2 puffs or albuterol nebulization every 4-6 hours  B.  Nasal saline spray to help moisturize nose     6. Return to clinic in 6 months or earlier if problem

## 2022-02-14 NOTE — Progress Notes (Signed)
Follow-up Note  RE: Angelica [name] MRN: [id number] DOB: Oct 04, 1940 Date of Office Visit: 02/14/2022   History of present illness: Angelica [name] is a 81 y.o. female presenting today for follow-up of chronic rhinitis with an allergy component, asthma, LPRD, nasal polyposis and urticaria.  She was last seen in the office on 04/21/2021 by myself.  She presents today with her daughter.  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  She underwent chemoradiation and consolidation treatment with immunotherapy.  She has been recommended to have repeat CT scan in 6 months from June 2023. In regards to her asthma she states she has been doing well and does not remember the last time she has needed to use her albuterol.  She does continue to use Symbicort 80 mcg 2 puffs twice a day, daily Singulair and Fasenra injections every 8 weeks.  She has not required any systemic steroid needs, ED or urgent care visits for her asthma. With her allergies she is taking Claritin daily but states she still has significant amount of nasal congestion at night.  She has been on Claritin for well over a year now.  She has been seen with ENT for the nasal congestion.  She is using nasal ipratropium she states twice a day. She has not had any further episodes of hives. Not reporting any acid reflux symptoms at this time.  Review of systems: Review of Systems  Constitutional: Negative.   HENT:  Positive for congestion.   Eyes: Negative.   Respiratory: Negative.    Cardiovascular: Negative.   Gastrointestinal: Negative.   Musculoskeletal: Negative.   Skin: Negative.   Allergic/Immunologic: Negative.   Neurological: Negative.      All other systems negative unless noted above in HPI  Past medical/social/surgical/family history have been reviewed and are unchanged unless specifically  indicated below.  No changes  Medication List: Current Outpatient Medications  Medication Sig Dispense Refill   acetaminophen (TYLENOL) 500 MG tablet Take 500 mg by mouth every 6 (six) hours as needed for moderate pain.     alum & mag hydroxide-simeth (MAALOX/MYLANTA) 200-200-20 MG/5ML suspension Take 15 mLs by mouth every 6 (six) hours as needed for indigestion or heartburn.     amLODipine (NORVASC) 10 MG tablet Take 1 tablet (10 mg total) by mouth daily. 90 tablet 3   Benralizumab (FASENRA) 30 MG/ML SOSY Inject 1 mL (30 mg total) into the skin every 8 (eight) weeks. 1 mL 6   cetirizine (ZYRTEC ALLERGY) 10 MG tablet Take 1 tablet (10 mg total) by mouth daily. 90 tablet 1   clopidogrel (PLAVIX) 75 MG tablet Take 1 tablet (75 mg total) by mouth daily. 90 tablet 3   EPINEPHrine (EPIPEN 2-PAK) 0.3 mg/0.3 mL IJ SOAJ injection Use as directed for severe allergic reaction 2 each 1   fluticasone (FLONASE) 50 MCG/ACT nasal spray Place 1 spray into both nostrils in the morning and at bedtime. 16 g 5   ipratropium (ATROVENT) 0.06 % nasal spray Place 2 sprays into both nostrils 4 (four) times daily as needed (nasal drainage). 15 mL 5   levothyroxine (SYNTHROID) 50 MCG tablet Take 1 tablet (50 mcg total) by mouth daily before breakfast. 90 tablet 3   Magnesium Hydroxide (PHILLIPS MILK OF MAGNESIA PO) Take 15-30 mLs by mouth daily as needed (for constipation).     meclizine (ANTIVERT) 25 MG  tablet Take 1 tablet (25 mg total) by mouth 2 (two) times daily as needed for dizziness. 10 tablet 0   memantine (NAMENDA) 5 MG tablet Take 1 tablet (5 mg at night) for 2 weeks, then increase to 1 tablet (5mg ) twice a day 60 tablet 11   ondansetron (ZOFRAN) 8 MG tablet Take 1 tablet (8 mg total) by mouth every 8 (eight) hours as needed for nausea or vomiting. 20 tablet 0   pantoprazole (PROTONIX) 20 MG tablet Take 1 tablet (20 mg total) by mouth daily. 14 tablet 0   polyethylene glycol (MIRALAX / GLYCOLAX) 17 g packet  Take 17 g by mouth daily as needed for mild constipation.     pravastatin (PRAVACHOL) 80 MG tablet Take 1 tablet by mouth once daily 90 tablet 3   SYMBICORT 80-4.5 MCG/ACT inhaler INHALE 2 PUFFS BY MOUTH IN THE MORNING AND AT BEDTIME 10.2 g 5   traMADol (ULTRAM) 50 MG tablet Take 50 mg by mouth every 6 (six) hours as needed for moderate pain.     diclofenac Sodium (VOLTAREN) 1 % GEL Apply 4 g topically 4 (four) times daily. (Patient not taking: Reported on 10/19/2021) 100 g 0   linaclotide (LINZESS) 72 MCG capsule TAKE 1 CAPSULE BY MOUTH ONCE DAILY AS NEEDED (Patient not taking: Reported on 09/26/2021) 30 capsule 0   montelukast (SINGULAIR) 10 MG tablet Take 1 tablet (10 mg total) by mouth at bedtime. 90 tablet 1   Current Facility-Administered Medications  Medication Dose Route Frequency Provider Last Rate Last Admin   Benralizumab SOSY 30 mg  30 mg Subcutaneous Q8 Weeks Kennith Gain, MD   30 mg at 01/08/22 6378     Known medication allergies: Allergies  Allergen Reactions   Asa Buff (Mag [Buffered Aspirin] Other (See Comments)    Excessive sweating   Ensure [Nutritional Supplements]     Or boost - upset stomach    Fish Allergy Other (See Comments)    Unknown reaction, but allergic   Other Other (See Comments)    Gelcaps; gel-containing capsules; extended-release medication - upset stomach    Peanut-Containing Drug Products Other (See Comments)    From allergy test   Penicillins Itching    Has patient had a PCN reaction causing immediate rash, facial/tongue/throat swelling, SOB or lightheadedness with hypotension: No Has patient had a PCN reaction causing severe rash involving mucus membranes or skin necrosis: No Has patient had a PCN reaction that required hospitalization No Has patient had a PCN reaction occurring within the last 10 years: No If all of the above answers are "NO", then may proceed with Cephalosporin use.   Shellfish-Derived Products Other (See Comments)     From allergy test   Strawberry Extract Other (See Comments)    From allergy test      Physical examination: Blood pressure 132/74, pulse 79, temperature 98.3 F (36.8 C), temperature source Temporal, resp. rate 15, weight 149 lb 12.8 oz (67.9 kg), SpO2 99 %.  General: Alert, interactive, in no acute distress. HEENT: PERRLA, TMs pearly gray, turbinates non-edematous without discharge, post-pharynx non erythematous. Neck: Supple without lymphadenopathy. Lungs: Clear to auscultation without wheezing, rhonchi or rales. {no increased work of breathing. CV: Normal S1, S2 without murmurs. Abdomen: Nondistended, nontender. Skin: Warm and dry, without lesions or rashes. Extremities:  No clubbing, cyanosis or edema. Neuro:   Grossly intact.  Diagnositics/Labs: None today  Assessment and plan: Allergic rhinitis/chronic rhinitis-nasal congestion is improved but is still ongoing worse at night.  She has been evaluated by ENT for this.  Environmental allergy panel from December 2022 was negative. Severe persistent asthma - remains under good control on her ICS/LABA regimen as well as biologic agent Fasenra LPRD -asymptomatic at this time off antireflux medication Nasal polyposis, history of Urticaria -has not had any further episodes    1. Continue Symbicort 2 puffs twice a day for asthma maintenance control  2. Continue Fasenra injections every 8 weeks.     3. Use Ipratropium 0.06% 2 sprays each nostril as needed for nasal drainage  4. Continue Singulair.   Change Claritin to Zyrtec 10mg  at this time  5.  If needed:   A.  Ventolin HFA 2 puffs or albuterol nebulization every 4-6 hours  B.  Nasal saline spray to help moisturize nose     6. Return to clinic in 6 months or earlier if problem   I appreciate the opportunity to take part in Aiva's care. Please do not hesitate to contact me with questions.  Sincerely,   Prudy Feeler, MD Allergy/Immunology [facility]

## 2022-02-26 ENCOUNTER — Ambulatory Visit (INDEPENDENT_AMBULATORY_CARE_PROVIDER_SITE_OTHER): Payer: Medicare HMO | Admitting: *Deleted

## 2022-02-26 DIAGNOSIS — Z23 Encounter for immunization: Secondary | ICD-10-CM

## 2022-02-26 NOTE — Progress Notes (Signed)
Patient here for her high dose flu shot. Given in left deltoid. Patient tolerated well.   Please co sign

## 2022-03-05 ENCOUNTER — Ambulatory Visit (INDEPENDENT_AMBULATORY_CARE_PROVIDER_SITE_OTHER): Payer: Medicare HMO

## 2022-03-05 ENCOUNTER — Ambulatory Visit (INDEPENDENT_AMBULATORY_CARE_PROVIDER_SITE_OTHER): Payer: Medicare HMO | Admitting: *Deleted

## 2022-03-05 DIAGNOSIS — J4550 Severe persistent asthma, uncomplicated: Secondary | ICD-10-CM

## 2022-03-05 DIAGNOSIS — Z0000 Encounter for general adult medical examination without abnormal findings: Secondary | ICD-10-CM

## 2022-03-05 NOTE — Progress Notes (Signed)
Virtual Visit via Telephone Note  I connected with  [name] on 03/05/22 at  3:15 PM EDT by telephone and verified that I am speaking with the correct person using two identifiers.  Location: Patient: Home with daughter Provider: [place] Persons participating in the virtual visit: patient/Nurse Health Advisor   I discussed the limitations, risks, security and privacy concerns of performing an evaluation and management service by telephone and the availability of in person appointments. The patient expressed understanding and agreed to proceed.  Interactive audio and video telecommunications were attempted between this nurse and patient, however failed, due to patient having technical difficulties OR patient did not have access to video capability.  We continued and completed visit with audio only.  Some vital signs may be absent or patient reported.   Sheral Flow, LPN  Subjective:   Angelica [name] is a 81 y.o. female who presents for Medicare Annual (Subsequent) preventive examination.  Review of Systems     Cardiac Risk Factors include: advanced age (>45men, >54 women);dyslipidemia;family history of premature cardiovascular disease;hypertension     Objective:    There were no vitals filed for this visit. There is no height or weight on file to calculate BMI.     03/05/2022    3:23 PM 07/24/2021    5:48 AM 05/16/2021    1:06 PM 03/21/2021   11:49 AM 03/02/2021   12:12 PM 02/27/2021    2:47 PM 01/28/2021    7:57 PM  Advanced Directives  Does Patient Have a Medical Advance Directive? Yes No Yes No Yes Yes No  Type of Paramedic of [place];Living will        Does patient want to make changes to medical advance directive? No - Patient declined     No - Patient declined   Copy of [place] in Chart? Yes - validated most recent copy scanned in chart (See row information)        Would patient like information on  creating a medical advance directive?  No - Patient declined         Current Medications (verified) Outpatient Encounter Medications as of 03/05/2022  Medication Sig   acetaminophen (TYLENOL) 500 MG tablet Take 500 mg by mouth every 6 (six) hours as needed for moderate pain.   alum & mag hydroxide-simeth (MAALOX/MYLANTA) 200-200-20 MG/5ML suspension Take 15 mLs by mouth every 6 (six) hours as needed for indigestion or heartburn.   amLODipine (NORVASC) 10 MG tablet Take 1 tablet (10 mg total) by mouth daily.   Benralizumab (FASENRA) 30 MG/ML SOSY Inject 1 mL (30 mg total) into the skin every 8 (eight) weeks.   cetirizine (ZYRTEC ALLERGY) 10 MG tablet Take 1 tablet (10 mg total) by mouth daily.   clopidogrel (PLAVIX) 75 MG tablet Take 1 tablet (75 mg total) by mouth daily.   diclofenac Sodium (VOLTAREN) 1 % GEL Apply 4 g topically 4 (four) times daily. (Patient not taking: Reported on 10/19/2021)   EPINEPHrine (EPIPEN 2-PAK) 0.3 mg/0.3 mL IJ SOAJ injection Use as directed for severe allergic reaction   fluticasone (FLONASE) 50 MCG/ACT nasal spray Place 1 spray into both nostrils in the morning and at bedtime.   ipratropium (ATROVENT) 0.06 % nasal spray Place 2 sprays into both nostrils 4 (four) times daily as needed (nasal drainage).   levothyroxine (SYNTHROID) 50 MCG tablet Take 1 tablet (50 mcg total) by mouth daily before breakfast.   linaclotide (LINZESS) 72 MCG capsule  TAKE 1 CAPSULE BY MOUTH ONCE DAILY AS NEEDED (Patient not taking: Reported on 09/26/2021)   Magnesium Hydroxide (PHILLIPS MILK OF MAGNESIA PO) Take 15-30 mLs by mouth daily as needed (for constipation).   meclizine (ANTIVERT) 25 MG tablet Take 1 tablet (25 mg total) by mouth 2 (two) times daily as needed for dizziness.   memantine (NAMENDA) 5 MG tablet Take 1 tablet (5 mg at night) for 2 weeks, then increase to 1 tablet (5mg ) twice a day   montelukast (SINGULAIR) 10 MG tablet Take 1 tablet (10 mg total) by mouth at bedtime.    ondansetron (ZOFRAN) 8 MG tablet Take 1 tablet (8 mg total) by mouth every 8 (eight) hours as needed for nausea or vomiting.   pantoprazole (PROTONIX) 20 MG tablet Take 1 tablet (20 mg total) by mouth daily.   polyethylene glycol (MIRALAX / GLYCOLAX) 17 g packet Take 17 g by mouth daily as needed for mild constipation.   pravastatin (PRAVACHOL) 80 MG tablet Take 1 tablet by mouth once daily   SYMBICORT 80-4.5 MCG/ACT inhaler INHALE 2 PUFFS BY MOUTH IN THE MORNING AND AT BEDTIME   traMADol (ULTRAM) 50 MG tablet Take 50 mg by mouth every 6 (six) hours as needed for moderate pain.   Facility-Administered Encounter Medications as of 03/05/2022  Medication   Benralizumab SOSY 30 mg    Allergies (verified) Asa buff (mag [buffered aspirin], Ensure [nutritional supplements], Fish allergy, Other, Peanut-containing drug products, Penicillins, Shellfish-derived products, and Strawberry extract   History: Past Medical History:  Diagnosis Date   Arthritis    Asthma    Bowel obstruction ([place])    Cancer ([place])    Environmental allergies    GERD (gastroesophageal reflux disease)    Glaucoma 05/09/2017   H. pylori infection    History of radiation therapy 07/05/20-08/05/20   Left lung IMRT Dr. Sondra Come    HLD (hyperlipidemia) 01/16/2019   Hypertension    Iron deficiency anemia    Osteopenia 05/26/2017   Thyroid disease    Urticaria 07/25/2018   Past Surgical History:  Procedure Laterality Date   ABDOMINAL HYSTERECTOMY     BOWEL RESECTION N/A 05/23/2020   Procedure: SMALL BOWEL REPAIR;  Surgeon: Kieth Brightly, Arta Bruce, MD;  Location: [facility];  Service: General;  Laterality: N/A;   BREAST BIOPSY     BRONCHIAL BIOPSY  06/10/2020   Procedure: BRONCHIAL BIOPSIES;  Surgeon: Garner Nash, DO;  Location: [facility];  Service: Pulmonary;;   BRONCHIAL BRUSHINGS  06/10/2020   Procedure: BRONCHIAL BRUSHINGS;  Surgeon: Garner Nash, DO;  Location: [facility];  Service: Pulmonary;;   BRONCHIAL NEEDLE  ASPIRATION BIOPSY  06/10/2020   Procedure: BRONCHIAL NEEDLE ASPIRATION BIOPSIES;  Surgeon: Garner Nash, DO;  Location: [facility];  Service: Pulmonary;;   BRONCHIAL WASHINGS  06/10/2020   Procedure: BRONCHIAL WASHINGS;  Surgeon: Garner Nash, DO;  Location: [facility];  Service: Pulmonary;;   COLON SURGERY     DIAGNOSTIC LARYNGOSCOPY N/A 05/23/2020   Procedure: ATTEMPTED DIAGNOSTIC LAPAROSCOPY WITH ADHESIONS;  Surgeon: Kieth Brightly, Arta Bruce, MD;  Location: [facility];  Service: General;  Laterality: N/A;   IR IMAGING GUIDED PORT INSERTION  07/15/2020   KNEE SURGERY     LAPAROTOMY N/A 04/18/2015   Procedure: Exploratory laparotomy with lysis of adhesions, possible bowel resection;  Surgeon: Ralene Ok, MD;  Location: [facility];  Service: General;  Laterality: N/A;   LAPAROTOMY N/A 05/23/2020   Procedure: EXPLORATORY LAPAROTOMY;  Surgeon: Mickeal Skinner, MD;  Location:  [facility];  Service: General;  Laterality: N/A;   LYSIS OF ADHESION N/A 05/23/2020   Procedure: LYSIS OF ADHESION;  Surgeon: Kieth Brightly, Arta Bruce, MD;  Location: [facility];  Service: General;  Laterality: N/A;   NASAL SINUS SURGERY     THYROID SURGERY     VIDEO BRONCHOSCOPY WITH ENDOBRONCHIAL NAVIGATION Bilateral 06/10/2020   Procedure: VIDEO BRONCHOSCOPY WITH ENDOBRONCHIAL NAVIGATION;  Surgeon: Garner Nash, DO;  Location: [facility];  Service: Pulmonary;  Laterality: Bilateral;   VIDEO BRONCHOSCOPY WITH ENDOBRONCHIAL ULTRASOUND  06/10/2020   Procedure: VIDEO BRONCHOSCOPY WITH ENDOBRONCHIAL ULTRASOUND;  Surgeon: Garner Nash, DO;  Location: [facility];  Service: Pulmonary;;   Family History  Problem Relation Age of Onset   Diabetes Mother    Hypertension Mother    Hypertension Father    Glaucoma Sister    Glaucoma Brother    Allergic rhinitis Neg Hx    Angioedema Neg Hx    Asthma Neg Hx    Eczema Neg Hx    Immunodeficiency Neg Hx    Urticaria Neg Hx    Social History   Socioeconomic History   Marital  status: Widowed    Spouse name: Not on file   Number of children: 4   Years of education: 71   Highest education level: Not on file  Occupational History   Occupation: retired  Tobacco Use   Smoking status: Former    Types: Cigarettes   Smokeless tobacco: Never   Tobacco comments:    quit 60 years ago  Vaping Use   Vaping Use: Never used  Substance and Sexual Activity   Alcohol use: No   Drug use: No   Sexual activity: Never  Other Topics Concern   Not on file  Social History Narrative   Right handed   Caffeine on occ   One story home   Social Determinants of Health   Financial Resource Strain: Low Risk  (04/25/2021)   Overall Financial Resource Strain (CARDIA)    Difficulty of Paying Living Expenses: Not hard at all  Food Insecurity: No Food Insecurity (11/16/2021)   Hunger Vital Sign    Worried About Running Out of Food in the Last Year: Never true    [place] in the Last Year: Never true  Transportation Needs: No Transportation Needs (11/16/2021)   PRAPARE - Hydrologist (Medical): No    Lack of Transportation (Non-Medical): No  Physical Activity: Unknown (04/25/2021)   Exercise Vital Sign    Days of Exercise per Week: 0 days    Minutes of Exercise per Session: Not on file  Recent Concern: Physical Activity - Inactive (04/25/2021)   Exercise Vital Sign    Days of Exercise per Week: 0 days    Minutes of Exercise per Session: 30 min  Stress: No Stress Concern Present (04/25/2021)   [place]    Feeling of Stress : Not at all  Social Connections: Moderately Isolated (04/25/2021)   Social Connection and Isolation Panel [NHANES]    Frequency of Communication with Friends and Family: More than three times a week    Frequency of Social Gatherings with Friends and Family: Twice a week    Attends Religious Services: More than 4 times per year    Active Member of [employer] or  Organizations: No    Attends Archivist Meetings: Not on file    Marital Status: Widowed  Tobacco Counseling Counseling given: Not Answered Tobacco comments: quit 60 years ago   Clinical Intake:  Pre-visit preparation completed: Yes  Pain : No/denies pain     BMI - recorded: 24.93 (02/14/2022) Nutritional Risks: None Diabetes: No  How often do you need to have someone help you when you read instructions, pamphlets, or other written materials from your doctor or pharmacy?: 1 - Never What is the last grade level you completed in school?: [place]  Diabetic? no  Interpreter Needed?: No  Information entered by :: Lisette Abu, LPN.   Activities of Daily Living    03/05/2022    3:28 PM  In your present state of health, do you have any difficulty performing the following activities:  Hearing? 0  Vision? 0  Difficulty concentrating or making decisions? 1  Walking or climbing stairs? 1  Comment unsteady gait  Dressing or bathing? 0  Doing errands, shopping? 1  Preparing Food and eating ? N  Using the Toilet? N  In the past six months, have you accidently leaked urine? N  Do you have problems with loss of bowel control? N  Managing your Medications? N  Managing your Finances? N  Housekeeping or managing your Housekeeping? N    Patient Care Team: [name], MD as PCP - General (Internal Medicine) Valrie Hart, RN as Oncology Nurse Navigator (Oncology) Curt Bears, MD as Consulting Physician (Oncology) Hortencia Pilar, MD as Consulting Physician (Ophthalmology) Kennith Gain, MD as Consulting Physician (Allergy) Valeta Harms Octavio Graves, DO as Consulting Physician (Pulmonary Disease) Garvin Fila, MD as Consulting Physician (Neurology)  Indicate any recent Medical Services you may have received from other than [facility] providers in the past year (date may be approximate).     Assessment:   This is a routine wellness  examination for Maryela.  Hearing/Vision screen Hearing Screening - Comments:: Denies hearing difficulties   Vision Screening - Comments:: Wears readers glasses - up to date with routine eye exams with [facility]/Dr. Lucianne Lei   Dietary issues and exercise activities discussed: Current Exercise Habits: The patient does not participate in regular exercise at present, Exercise limited by: neurologic condition(s);respiratory conditions(s);Other - see comments (gait d/o)   Goals Addressed   None   Depression Screen    03/05/2022    3:27 PM 11/16/2021    9:00 AM 06/09/2021   11:10 AM 02/27/2021    2:36 PM 12/26/2020    9:00 AM 10/13/2020   10:27 AM 06/14/2020   10:49 AM  PHQ 2/9 Scores  PHQ - 2 Score 0 0 0 0 0 0 0    Fall Risk    03/05/2022    3:23 PM 11/16/2021    9:00 AM 08/09/2021    9:00 AM 06/09/2021   11:10 AM 04/26/2021    9:30 AM  Fall Risk   Falls in the past year? 0 0 0 0 0  Comment  dgtr/ caregiver continues to deny falls x 12 months; patient not currently using assistive devices caregiver denies new/ recent falls; patient does not use assistive devices    Number falls in past yr: 0 0 0 0 0  Injury with Fall? 0 0 0 0 0  Comment  N/A- no falls reported N/A, no falls reported  N/A- no falls reported  Risk for fall due to : No Fall Risks Medication side effect Mental status change  Medication side effect  Risk for fall due to: Comment   recent CVA  Follow up Falls prevention discussed Falls prevention discussed   Falls prevention discussed    FALL RISK PREVENTION PERTAINING TO THE HOME:  Any stairs in or around the home? No  If so, are there any without handrails? No  Home free of loose throw rugs in walkways, pet beds, electrical cords, etc? Yes  Adequate lighting in your home to reduce risk of falls? Yes   ASSISTIVE DEVICES UTILIZED TO PREVENT FALLS:  Life alert? Yes  Jitter Bug Emergency Phone Use of a cane, walker or w/c? Yes  Grab bars in the bathroom? No   Shower chair or bench in shower? No  Elevated toilet seat or a handicapped toilet? Yes   TIMED UP AND GO:  Was the test performed? No . Phone Visit  Cognitive Function:    03/05/2022    3:30 PM 08/07/2021   11:18 AM  MMSE - Mini Mental State Exam  Not completed: Unable to complete   Orientation to time  2  Orientation to Place  5  Registration  3  Attention/ Calculation  0  Recall  0  Language- name 2 objects  2  Language- repeat  1  Language- follow 3 step command  3  Language- read & follow direction  1  Write a sentence  1  Copy design  0  Copy design-comments  Four-legged animals x 1 min: 11  Total score  18      07/20/2021    8:00 AM  Montreal Cognitive Assessment   Visuospatial/ Executive (0/5) 1  Naming (0/3) 3  Attention: Read list of digits (0/2) 2  Attention: Read list of letters (0/1) 1  Attention: Serial 7 subtraction starting at 100 (0/3) 0  Language: Repeat phrase (0/2) 1  Language : Fluency (0/1) 0  Abstraction (0/2) 1  Delayed Recall (0/5) 0  Orientation (0/6) 1  Total 10  Adjusted Score (based on education) 11      03/05/2022    3:30 PM  6CIT Screen  What Year? 0 points  What month? 0 points  What time? 0 points  Count back from 20 0 points  Months in reverse 0 points  Repeat phrase 0 points  Total Score 0 points    Immunizations Immunization History  Administered Date(s) Administered   Fluad Quad(high Dose 65+) 01/16/2019, 03/05/2020, 02/01/2021, 02/26/2022   Influenza, High Dose Seasonal PF 01/30/2018   PFIZER Comirnaty(Gray Top)Covid-19 Tri-Sucrose Vaccine 02/23/2022   PFIZER(Purple Top)SARS-COV-2 Vaccination 07/13/2019, 08/03/2019, 02/16/2020   Pneumococcal Conjugate-13 11/08/2017    TDAP status: Due, Education has been provided regarding the importance of this vaccine. Advised may receive this vaccine at local pharmacy or Health Dept. Aware to provide a copy of the vaccination record if obtained from local pharmacy or Health  Dept. Verbalized acceptance and understanding.  Flu Vaccine status: Up to date  Pneumococcal vaccine status: Due, Education has been provided regarding the importance of this vaccine. Advised may receive this vaccine at local pharmacy or Health Dept. Aware to provide a copy of the vaccination record if obtained from local pharmacy or Health Dept. Verbalized acceptance and understanding.  Covid-19 vaccine status: Completed vaccines  Qualifies for Shingles Vaccine? Yes   Zostavax completed No   Shingrix Completed?: No.    Education has been provided regarding the importance of this vaccine. Patient has been advised to call insurance company to determine out of pocket expense if they have not yet received this vaccine. Advised may also receive vaccine at local pharmacy or Health  Dept. Verbalized acceptance and understanding.  Screening Tests Health Maintenance  Topic Date Due   Zoster Vaccines- Shingrix (1 of 2) Never done   TETANUS/TDAP  06/09/2022 (Originally 12/23/1959)   Pneumonia Vaccine 79+ Years old (2 - PPSV23 or PCV20) 06/10/2022 (Originally 11/09/2018)   COVID-19 Vaccine (5 - Pfizer risk series) 04/20/2022   INFLUENZA VACCINE  Completed   DEXA SCAN  Completed   HPV VACCINES  Aged Out    Health Maintenance  Health Maintenance Due  Topic Date Due   Zoster Vaccines- Shingrix (1 of 2) Never done    Colorectal cancer screening: No longer required.   Mammogram status: Completed 02/21/2021. Repeat every year  Bone Density status: Completed 05/16/2017. Results reflect: Bone density results: OSTEOPENIA. Repeat every 2-3 years.  Lung Cancer Screening: (Low Dose CT Chest recommended if Age 23-80 years, 30 pack-year currently smoking OR have quit w/in 15years.) does not qualify.   Lung Cancer Screening Referral: no  Additional Screening:  Hepatitis C Screening: does not qualify; Completed no  Vision Screening: Recommended annual ophthalmology exams for early detection of glaucoma  and other disorders of the eye. Is the patient up to date with their annual eye exam?  Yes  Who is the provider or what is the name of the office in which the patient attends annual eye exams? [facility] If pt is not established with a provider, would they like to be referred to a provider to establish care? No .   Dental Screening: Recommended annual dental exams for proper oral hygiene  Community Resource Referral / Chronic Care Management: CRR required this visit?  No   CCM required this visit?  No      Plan:     I have personally reviewed and noted the following in the patient's chart:   Medical and social history Use of alcohol, tobacco or illicit drugs  Current medications and supplements including opioid prescriptions. Patient is not currently taking opioid prescriptions. Functional ability and status Nutritional status Physical activity Advanced directives List of other physicians Hospitalizations, surgeries, and ER visits in previous 12 months Vitals Screenings to include cognitive, depression, and falls Referrals and appointments  In addition, I have reviewed and discussed with patient certain preventive protocols, quality metrics, and best practice recommendations. A written personalized care plan for preventive services as well as general preventive health recommendations were provided to patient.     Sheral Flow, LPN   [id number]   Nurse Notes:  Patient has current diagnosis of cognitive impairment. Patient is followed by neurology for ongoing assessment.  Patient is unable to complete screening 6CIT or MMSE.

## 2022-03-05 NOTE — Patient Instructions (Signed)
Angelica [name] , Thank you for taking time to come for your Medicare Wellness Visit. I appreciate your ongoing commitment to your health goals. Please review the following plan we discussed and let me know if I can assist you in the future.   These are the goals we discussed:  Goals       Client understands the importance of follow-up with providers by attending scheduled visits (pt-stated)      Continue to maintain, be active physically and socially.        This is a list of the screening recommended for you and due dates:  Health Maintenance  Topic Date Due   Zoster (Shingles) Vaccine (1 of 2) Never done   Tetanus Vaccine  06/09/2022*   Pneumonia Vaccine (2 - PPSV23 or PCV20) 06/10/2022*   COVID-19 Vaccine (5 - Pfizer risk series) 04/20/2022   Flu Shot  Completed   DEXA scan (bone density measurement)  Completed   HPV Vaccine  Aged Out  *Topic was postponed. The date shown is not the original due date.    Advanced directives: Yes; documents on file.  Conditions/risks identified: Yes  Next appointment: Follow up in one year for your annual wellness visit.   Preventive Care 20 Years and Older, Female Preventive care refers to lifestyle choices and visits with your health care provider that can promote health and wellness. What does preventive care include? A yearly physical exam. This is also called an annual well check. Dental exams once or twice a year. Routine eye exams. Ask your health care provider how often you should have your eyes checked. Personal lifestyle choices, including: Daily care of your teeth and gums. Regular physical activity. Eating a healthy diet. Avoiding tobacco and drug use. Limiting alcohol use. Practicing safe sex. Taking low-dose aspirin every day. Taking vitamin and mineral supplements as recommended by your health care provider. What happens during an annual well check? The services and screenings done by your health care provider during your  annual well check will depend on your age, overall health, lifestyle risk factors, and family history of disease. Counseling  Your health care provider may ask you questions about your: Alcohol use. Tobacco use. Drug use. Emotional well-being. Home and relationship well-being. Sexual activity. Eating habits. History of falls. Memory and ability to understand (cognition). Work and work Statistician. Reproductive health. Screening  You may have the following tests or measurements: Height, weight, and BMI. Blood pressure. Lipid and cholesterol levels. These may be checked every 5 years, or more frequently if you are over 63 years old. Skin check. Lung cancer screening. You may have this screening every year starting at age 5 if you have a 30-pack-year history of smoking and currently smoke or have quit within the past 15 years. Fecal occult blood test (FOBT) of the stool. You may have this test every year starting at age 64. Flexible sigmoidoscopy or colonoscopy. You may have a sigmoidoscopy every 5 years or a colonoscopy every 10 years starting at age 102. Hepatitis C blood test. Hepatitis B blood test. Sexually transmitted disease (STD) testing. Diabetes screening. This is done by checking your blood sugar (glucose) after you have not eaten for a while (fasting). You may have this done every 1-3 years. Bone density scan. This is done to screen for osteoporosis. You may have this done starting at age 83. Mammogram. This may be done every 1-2 years. Talk to your health care provider about how often you should have regular mammograms. Talk  with your health care provider about your test results, treatment options, and if necessary, the need for more tests. Vaccines  Your health care provider may recommend certain vaccines, such as: Influenza vaccine. This is recommended every year. Tetanus, diphtheria, and acellular pertussis (Tdap, Td) vaccine. You may need a Td booster every 10  years. Zoster vaccine. You may need this after age 17. Pneumococcal 13-valent conjugate (PCV13) vaccine. One dose is recommended after age 10. Pneumococcal polysaccharide (PPSV23) vaccine. One dose is recommended after age 16. Talk to your health care provider about which screenings and vaccines you need and how often you need them. This information is not intended to replace advice given to you by your health care provider. Make sure you discuss any questions you have with your health care provider. Document Released: 06/03/2015 Document Revised: 01/25/2016 Document Reviewed: 03/08/2015 Elsevier Interactive Patient Education  2017 [place] Prevention in the Home Falls can cause injuries. They can happen to people of all ages. There are many things you can do to make your home safe and to help prevent falls. What can I do on the outside of my home? Regularly fix the edges of walkways and driveways and fix any cracks. Remove anything that might make you trip as you walk through a door, such as a raised step or threshold. Trim any bushes or trees on the path to your home. Use bright outdoor lighting. Clear any walking paths of anything that might make someone trip, such as rocks or tools. Regularly check to see if handrails are loose or broken. Make sure that both sides of any steps have handrails. Any raised decks and porches should have guardrails on the edges. Have any leaves, snow, or ice cleared regularly. Use sand or salt on walking paths during winter. Clean up any spills in your garage right away. This includes oil or grease spills. What can I do in the bathroom? Use night lights. Install grab bars by the toilet and in the tub and shower. Do not use towel bars as grab bars. Use non-skid mats or decals in the tub or shower. If you need to sit down in the shower, use a plastic, non-slip stool. Keep the floor dry. Clean up any water that spills on the floor as soon as it  happens. Remove soap buildup in the tub or shower regularly. Attach bath mats securely with double-sided non-slip rug tape. Do not have throw rugs and other things on the floor that can make you trip. What can I do in the bedroom? Use night lights. Make sure that you have a light by your bed that is easy to reach. Do not use any sheets or blankets that are too big for your bed. They should not hang down onto the floor. Have a firm chair that has side arms. You can use this for support while you get dressed. Do not have throw rugs and other things on the floor that can make you trip. What can I do in the kitchen? Clean up any spills right away. Avoid walking on wet floors. Keep items that you use a lot in easy-to-reach places. If you need to reach something above you, use a strong step stool that has a grab bar. Keep electrical cords out of the way. Do not use floor polish or wax that makes floors slippery. If you must use wax, use non-skid floor wax. Do not have throw rugs and other things on the floor that can make you  trip. What can I do with my stairs? Do not leave any items on the stairs. Make sure that there are handrails on both sides of the stairs and use them. Fix handrails that are broken or loose. Make sure that handrails are as long as the stairways. Check any carpeting to make sure that it is firmly attached to the stairs. Fix any carpet that is loose or worn. Avoid having throw rugs at the top or bottom of the stairs. If you do have throw rugs, attach them to the floor with carpet tape. Make sure that you have a light switch at the top of the stairs and the bottom of the stairs. If you do not have them, ask someone to add them for you. What else can I do to help prevent falls? Wear shoes that: Do not have high heels. Have rubber bottoms. Are comfortable and fit you well. Are closed at the toe. Do not wear sandals. If you use a stepladder: Make sure that it is fully opened.  Do not climb a closed stepladder. Make sure that both sides of the stepladder are locked into place. Ask someone to hold it for you, if possible. Clearly mark and make sure that you can see: Any grab bars or handrails. First and last steps. Where the edge of each step is. Use tools that help you move around (mobility aids) if they are needed. These include: Canes. Walkers. Scooters. Crutches. Turn on the lights when you go into a dark area. Replace any light bulbs as soon as they burn out. Set up your furniture so you have a clear path. Avoid moving your furniture around. If any of your floors are uneven, fix them. If there are any pets around you, be aware of where they are. Review your medicines with your doctor. Some medicines can make you feel dizzy. This can increase your chance of falling. Ask your doctor what other things that you can do to help prevent falls. This information is not intended to replace advice given to you by your health care provider. Make sure you discuss any questions you have with your health care provider. Document Released: 03/03/2009 Document Revised: 10/13/2015 Document Reviewed: 06/11/2014 Elsevier Interactive Patient Education  2017 [employer].

## 2022-03-20 ENCOUNTER — Inpatient Hospital Stay: Payer: Medicare HMO | Attending: Internal Medicine

## 2022-03-20 ENCOUNTER — Other Ambulatory Visit: Payer: Self-pay

## 2022-03-20 DIAGNOSIS — C3432 Malignant neoplasm of lower lobe, left bronchus or lung: Secondary | ICD-10-CM | POA: Diagnosis not present

## 2022-03-20 DIAGNOSIS — Z95828 Presence of other vascular implants and grafts: Secondary | ICD-10-CM

## 2022-03-20 MED ORDER — SODIUM CHLORIDE 0.9% FLUSH
10.0000 mL | Freq: Once | INTRAVENOUS | Status: AC
  Administered 2022-03-20: 10 mL

## 2022-03-20 MED ORDER — HEPARIN SOD (PORK) LOCK FLUSH 100 UNIT/ML IV SOLN
500.0000 [IU] | Freq: Once | INTRAVENOUS | Status: AC
  Administered 2022-03-20: 500 [IU]

## 2022-03-22 ENCOUNTER — Ambulatory Visit: Payer: Medicare HMO | Admitting: Pulmonary Disease

## 2022-03-22 NOTE — Progress Notes (Deleted)
Synopsis: Referred in January 2022 for lung mass by [name], MD  Subjective:   PATIENT ID: Angelica [name] GENDER: female DOB: May 20, 1941, MRN: [id number]  No chief complaint on file.   This is an 81 year old female with a past medical history of bowel obstruction, hyperlipidemia, hypertension, thyroid disease, asthma, allergies on Fasenra [facility].  Patient had a recent hospitalization, found to be COVID-positive, both vaccines plus booster.  She had a small bowel obstruction with abdominal pain nausea vomiting, incidental COVID infection.  Seen by general surgery underwent laparoscopy with lysis of adhesions during hospitalization had to be started on TPN.  Ultimately was discharged home.  During this evaluation patient found to have a left-sided lower lobe lung mass with associated mediastinal adenopathy.  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  Patient was referred at the time of discharge from the hospital for evaluation by pulmonary medicine for consideration of tissue diagnosis and biopsy after recovery from COVID-19.  At this time patient has no significant respiratory complaints has been doing well at home.  Patient accompanied today in the office by patient's daughter.  They are in agreements for consideration for tissue biopsy and bronchoscopy.  Patient denies fevers chills, denies hemoptysis.  She has been losing weight she does have poor appetite.  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  She has severe asthma at baseline on Symbicort plus Fasenra.  Respiratory status at this time relatively stable.  Bronchoscopy went well.  She has completed her chemotherapy cycle plus radiation.  Office note from 08/08/2020, Dr.  Earlie Server reviewed today in clinic.  Patient's weight has been stable.  Eating well.   OV 03/16/2021: Doing well at this time.  No significant respiratory changes.  Follows with Dr. Earlie Server.  Had recent CT imaging that revealed improvement in the lower lobe mass.  Not on immunotherapy at this time after admission for infection.  No significant breathing complaints today.    Past Medical History:  Diagnosis Date   Arthritis    Asthma    Bowel obstruction (HCC)    Cancer (HCC)    Environmental allergies    GERD (gastroesophageal reflux disease)    Glaucoma 05/09/2017   H. pylori infection    History of radiation therapy 07/05/20-08/05/20   Left lung IMRT Dr. Sondra Come    HLD (hyperlipidemia) 01/16/2019   Hypertension    Iron deficiency anemia    Osteopenia 05/26/2017   Thyroid disease    Urticaria 07/25/2018     Family History  Problem Relation Age of Onset   Diabetes Mother    Hypertension Mother    Hypertension Father    Glaucoma Sister    Glaucoma Brother    Allergic rhinitis Neg Hx    Angioedema Neg Hx    Asthma Neg Hx    Eczema Neg Hx    Immunodeficiency Neg Hx    Urticaria Neg Hx      Past Surgical History:  Procedure Laterality Date   ABDOMINAL HYSTERECTOMY     BOWEL RESECTION N/A 05/23/2020   Procedure: SMALL BOWEL REPAIR;  Surgeon: Kieth Brightly, Arta Bruce, MD;  Location: [place];  Service: General;  Laterality: N/A;   BREAST BIOPSY     BRONCHIAL BIOPSY  06/10/2020   Procedure: BRONCHIAL  BIOPSIES;  Surgeon: Garner Nash, DO;  Location: [place] [facility];  Service: Pulmonary;;   BRONCHIAL BRUSHINGS  06/10/2020   Procedure: BRONCHIAL BRUSHINGS;  Surgeon: Garner Nash, DO;  Location: [place];  Service: Pulmonary;;   BRONCHIAL NEEDLE ASPIRATION BIOPSY  06/10/2020   Procedure: BRONCHIAL NEEDLE ASPIRATION BIOPSIES;  Surgeon: Garner Nash, DO;  Location: [place] [facility];  Service: Pulmonary;;   BRONCHIAL WASHINGS  06/10/2020   Procedure: BRONCHIAL WASHINGS;  Surgeon:  Garner Nash, DO;  Location: [place] [facility];  Service: Pulmonary;;   COLON SURGERY     DIAGNOSTIC LARYNGOSCOPY N/A 05/23/2020   Procedure: ATTEMPTED DIAGNOSTIC LAPAROSCOPY WITH ADHESIONS;  Surgeon: Kieth Brightly, Arta Bruce, MD;  Location: [place];  Service: General;  Laterality: N/A;   IR IMAGING GUIDED PORT INSERTION  07/15/2020   KNEE SURGERY     LAPAROTOMY N/A 04/18/2015   Procedure: Exploratory laparotomy with lysis of adhesions, possible bowel resection;  Surgeon: Ralene Ok, MD;  Location: [place];  Service: General;  Laterality: N/A;   LAPAROTOMY N/A 05/23/2020   Procedure: EXPLORATORY LAPAROTOMY;  Surgeon: Mickeal Skinner, MD;  Location: [place];  Service: General;  Laterality: N/A;   LYSIS OF ADHESION N/A 05/23/2020   Procedure: LYSIS OF ADHESION;  Surgeon: Kieth Brightly Arta Bruce, MD;  Location: [place];  Service: General;  Laterality: N/A;   NASAL SINUS SURGERY     THYROID SURGERY     VIDEO BRONCHOSCOPY WITH ENDOBRONCHIAL NAVIGATION Bilateral 06/10/2020   Procedure: VIDEO BRONCHOSCOPY WITH ENDOBRONCHIAL NAVIGATION;  Surgeon: Garner Nash, DO;  Location: [place];  Service: Pulmonary;  Laterality: Bilateral;   VIDEO BRONCHOSCOPY WITH ENDOBRONCHIAL ULTRASOUND  06/10/2020   Procedure: VIDEO BRONCHOSCOPY WITH ENDOBRONCHIAL ULTRASOUND;  Surgeon: Garner Nash, DO;  Location: [place] [facility];  Service: Pulmonary;;    Social History   Socioeconomic History   Marital status: Widowed    Spouse name: Not on file   Number of children: 4   Years of education: 66   Highest education level: Not on file  Occupational History   Occupation: retired  Tobacco Use   Smoking status: Former    Types: Cigarettes   Smokeless tobacco: Never   Tobacco comments:    quit 60 years ago  Vaping Use   Vaping Use: Never used  Substance and Sexual Activity   Alcohol use: No   Drug use: No   Sexual activity: Never  Other Topics Concern   Not on file  Social History Narrative   Right handed    Caffeine on occ   One story home   Social Determinants of Health   Financial Resource Strain: Low Risk  (04/25/2021)   Overall Financial Resource Strain (CARDIA)    Difficulty of Paying Living Expenses: Not hard at all  Food Insecurity: No Food Insecurity (11/16/2021)   Hunger Vital Sign    Worried About Running Out of Food in the Last Year: Never true    [place] in the Last Year: Never true  Transportation Needs: No Transportation Needs (11/16/2021)   PRAPARE - Hydrologist (Medical): No    Lack of Transportation (Non-Medical): No  Physical Activity: Unknown (04/25/2021)   Exercise Vital Sign    Days of Exercise per Week: 0 days    Minutes of Exercise per Session: Not on file  Recent Concern: Physical Activity - Inactive (04/25/2021)   Exercise Vital Sign    Days of Exercise per Week: 0 days    Minutes  of Exercise per Session: 30 min  Stress: No Stress Concern Present (04/25/2021)   [place]    Feeling of Stress : Not at all  Social Connections: Moderately Isolated (04/25/2021)   Social Connection and Isolation Panel [NHANES]    Frequency of Communication with Friends and Family: More than three times a week    Frequency of Social Gatherings with Friends and Family: Twice a week    Attends Religious Services: More than 4 times per year    Active Member of [employer] or Organizations: No    Attends Archivist Meetings: Not on file    Marital Status: Widowed  Intimate Partner Violence: Not At Risk (02/27/2021)   Humiliation, Afraid, Rape, and Kick questionnaire    Fear of Current or Ex-Partner: No    Emotionally Abused: No    Physically Abused: No    Sexually Abused: No     Allergies  Allergen Reactions   Asa Buff (Mag [Buffered Aspirin] Other (See Comments)    Excessive sweating   Ensure [Nutritional Supplements]     Or boost - upset stomach    Fish Allergy Other (See  Comments)    Unknown reaction, but allergic   Other Other (See Comments)    Gelcaps; gel-containing capsules; extended-release medication - upset stomach    Peanut-Containing Drug Products Other (See Comments)    From allergy test   Penicillins Itching    Has patient had a PCN reaction causing immediate rash, facial/tongue/throat swelling, SOB or lightheadedness with hypotension: No Has patient had a PCN reaction causing severe rash involving mucus membranes or skin necrosis: No Has patient had a PCN reaction that required hospitalization No Has patient had a PCN reaction occurring within the last 10 years: No If all of the above answers are "NO", then may proceed with Cephalosporin use.   Shellfish-Derived Products Other (See Comments)    From allergy test   Strawberry Extract Other (See Comments)    From allergy test      Outpatient Medications Prior to Visit  Medication Sig Dispense Refill   acetaminophen (TYLENOL) 500 MG tablet Take 500 mg by mouth every 6 (six) hours as needed for moderate pain.     alum & mag hydroxide-simeth (MAALOX/MYLANTA) 200-200-20 MG/5ML suspension Take 15 mLs by mouth every 6 (six) hours as needed for indigestion or heartburn.     amLODipine (NORVASC) 10 MG tablet Take 1 tablet (10 mg total) by mouth daily. 90 tablet 3   Benralizumab (FASENRA) 30 MG/ML SOSY Inject 1 mL (30 mg total) into the skin every 8 (eight) weeks. 1 mL 6   cetirizine (ZYRTEC ALLERGY) 10 MG tablet Take 1 tablet (10 mg total) by mouth daily. 90 tablet 1   clopidogrel (PLAVIX) 75 MG tablet Take 1 tablet (75 mg total) by mouth daily. 90 tablet 3   diclofenac Sodium (VOLTAREN) 1 % GEL Apply 4 g topically 4 (four) times daily. (Patient not taking: Reported on 10/19/2021) 100 g 0   EPINEPHrine (EPIPEN 2-PAK) 0.3 mg/0.3 mL IJ SOAJ injection Use as directed for severe allergic reaction 2 each 1   fluticasone (FLONASE) 50 MCG/ACT nasal spray Place 1 spray into both nostrils in the morning and at  bedtime. 16 g 5   ipratropium (ATROVENT) 0.06 % nasal spray Place 2 sprays into both nostrils 4 (four) times daily as needed (nasal drainage). 15 mL 5   levothyroxine (SYNTHROID) 50 MCG tablet Take 1 tablet (50  mcg total) by mouth daily before breakfast. 90 tablet 3   linaclotide (LINZESS) 72 MCG capsule TAKE 1 CAPSULE BY MOUTH ONCE DAILY AS NEEDED (Patient not taking: Reported on 09/26/2021) 30 capsule 0   Magnesium Hydroxide (PHILLIPS MILK OF MAGNESIA PO) Take 15-30 mLs by mouth daily as needed (for constipation).     meclizine (ANTIVERT) 25 MG tablet Take 1 tablet (25 mg total) by mouth 2 (two) times daily as needed for dizziness. 10 tablet 0   memantine (NAMENDA) 5 MG tablet Take 1 tablet (5 mg at night) for 2 weeks, then increase to 1 tablet (5mg ) twice a day 60 tablet 11   montelukast (SINGULAIR) 10 MG tablet Take 1 tablet (10 mg total) by mouth at bedtime. 90 tablet 1   ondansetron (ZOFRAN) 8 MG tablet Take 1 tablet (8 mg total) by mouth every 8 (eight) hours as needed for nausea or vomiting. 20 tablet 0   pantoprazole (PROTONIX) 20 MG tablet Take 1 tablet (20 mg total) by mouth daily. 14 tablet 0   polyethylene glycol (MIRALAX / GLYCOLAX) 17 g packet Take 17 g by mouth daily as needed for mild constipation.     pravastatin (PRAVACHOL) 80 MG tablet Take 1 tablet by mouth once daily 90 tablet 3   SYMBICORT 80-4.5 MCG/ACT inhaler INHALE 2 PUFFS BY MOUTH IN THE MORNING AND AT BEDTIME 10.2 g 5   traMADol (ULTRAM) 50 MG tablet Take 50 mg by mouth every 6 (six) hours as needed for moderate pain.     Facility-Administered Medications Prior to Visit  Medication Dose Route Frequency Provider Last Rate Last Admin   Benralizumab SOSY 30 mg  30 mg Subcutaneous Q8 Weeks Kennith Gain, MD   30 mg at 03/05/22 2025    Review of Systems  Constitutional:  Positive for malaise/fatigue. Negative for chills, fever and weight loss.  HENT:  Negative for hearing loss, sore throat and tinnitus.    Eyes:  Negative for blurred vision and double vision.  Respiratory:  Negative for cough, hemoptysis, sputum production, shortness of breath, wheezing and stridor.   Cardiovascular:  Negative for chest pain, palpitations, orthopnea, leg swelling and PND.  Gastrointestinal:  Negative for abdominal pain, constipation, diarrhea, heartburn, nausea and vomiting.  Genitourinary:  Negative for dysuria, hematuria and urgency.  Musculoskeletal:  Negative for joint pain and myalgias.  Skin:  Negative for itching and rash.  Neurological:  Negative for dizziness, tingling, weakness and headaches.  Endo/Heme/Allergies:  Negative for environmental allergies. Does not bruise/bleed easily.  Psychiatric/Behavioral:  Negative for depression. The patient is not nervous/anxious and does not have insomnia.   All other systems reviewed and are negative.    Objective:  Physical Exam Vitals reviewed.  Constitutional:      General: She is not in acute distress.    Appearance: She is well-developed.  HENT:     Head: Normocephalic and atraumatic.  Eyes:     General: No scleral icterus.    Conjunctiva/sclera: Conjunctivae normal.     Pupils: Pupils are equal, round, and reactive to light.  Neck:     Vascular: No JVD.     Trachea: No tracheal deviation.  Cardiovascular:     Rate and Rhythm: Normal rate and regular rhythm.     Heart sounds: Normal heart sounds. No murmur heard. Pulmonary:     Effort: Pulmonary effort is normal. No tachypnea, accessory muscle usage or respiratory distress.     Breath sounds: No stridor. No wheezing, rhonchi or rales.  Abdominal:     General: Bowel sounds are normal. There is no distension.     Palpations: Abdomen is soft.     Tenderness: There is no abdominal tenderness.  Musculoskeletal:        General: No tenderness.     Cervical back: Neck supple.  Lymphadenopathy:     Cervical: No cervical adenopathy.  Skin:    General: Skin is warm and dry.     Capillary Refill:  Capillary refill takes less than 2 seconds.     Findings: No rash.  Neurological:     Mental Status: She is alert and oriented to person, place, and time.  Psychiatric:        Behavior: Behavior normal.      There were no vitals filed for this visit.    on RA BMI Readings from Last 3 Encounters:  02/14/22 24.93 kg/m  11/14/21 25.89 kg/m  08/07/21 25.89 kg/m   Wt Readings from Last 3 Encounters:  02/14/22 149 lb 12.8 oz (67.9 kg)  11/14/21 155 lb 9.6 oz (70.6 kg)  08/07/21 155 lb 9.6 oz (70.6 kg)     CBC    Component Value Date/Time   WBC 5.7 11/10/2021 0845   WBC 5.8 10/19/2021 1456   RBC 4.54 11/10/2021 0845   HGB 13.9 11/10/2021 0845   HGB 13.1 04/09/2017 1721   HCT 40.4 11/10/2021 0845   HCT 39.8 04/09/2017 1721   PLT 246 11/10/2021 0845   PLT 355 04/09/2017 1721   MCV 89.0 11/10/2021 0845   MCV 91 04/09/2017 1721   MCH 30.6 11/10/2021 0845   MCHC 34.4 11/10/2021 0845   RDW 14.0 11/10/2021 0845   RDW 14.0 04/09/2017 1721   LYMPHSABS 1.4 11/10/2021 0845   LYMPHSABS 3.6 (H) 04/09/2017 1721   MONOABS 0.5 11/10/2021 0845   EOSABS 0.0 11/10/2021 0845   EOSABS 0.7 (H) 04/09/2017 1721   BASOSABS 0.0 11/10/2021 0845   BASOSABS 0.0 04/09/2017 1721    Chest Imaging: CT chest 2020-05-17: Patient found to have left lower lobe 3 cm lung mass with associated mediastinal adenopathy concerning for primary bronchogenic carcinoma and advanced age. Images reviewed today in the office.The patient's images have been independently reviewed by me.    02/28/2021 CT chest: Dramatic improvement of the left lower lobe lesion, radiation fibrosis. No significant hilar adenopathy. The patient's images have been independently reviewed by me.    Pulmonary Functions Testing Results:     No data to display          FeNO:   Pathology:   06/10/2020:  FINAL MICROSCOPIC DIAGNOSIS:  D. LYMPH NODE, 7 NODE, FINE NEEDLE ASPIRATION:  - Malignant cells consistent with non-small  cell carcinoma. The patient's path have been reviewed by me.    Echocardiogram:   Heart Catheterization:    Assessment & Plan:   No diagnosis found.   Discussion:  This is a 81 year old female, left upper lobe mass mediastinal adenopathy status post bronchoscopy tissue diagnosis of stage IIIb adenocarcinoma of the lung status post radiation and chemotherapy.  She has severe asthma at baseline on Fasenra plus Symbicort with as needed albuterol.  Plan: Continue her current asthma regimen She is doing well with this. Continue routine follow-up medical oncology. We will continue to follow her for her respiratory issues. She can see [place] in 6 months or as needed.     Current Outpatient Medications:    acetaminophen (TYLENOL) 500 MG tablet, Take 500 mg by mouth every 6 (six)  hours as needed for moderate pain., Disp: , Rfl:    alum & mag hydroxide-simeth (MAALOX/MYLANTA) 200-200-20 MG/5ML suspension, Take 15 mLs by mouth every 6 (six) hours as needed for indigestion or heartburn., Disp: , Rfl:    amLODipine (NORVASC) 10 MG tablet, Take 1 tablet (10 mg total) by mouth daily., Disp: 90 tablet, Rfl: 3   Benralizumab (FASENRA) 30 MG/ML SOSY, Inject 1 mL (30 mg total) into the skin every 8 (eight) weeks., Disp: 1 mL, Rfl: 6   cetirizine (ZYRTEC ALLERGY) 10 MG tablet, Take 1 tablet (10 mg total) by mouth daily., Disp: 90 tablet, Rfl: 1   clopidogrel (PLAVIX) 75 MG tablet, Take 1 tablet (75 mg total) by mouth daily., Disp: 90 tablet, Rfl: 3   diclofenac Sodium (VOLTAREN) 1 % GEL, Apply 4 g topically 4 (four) times daily. (Patient not taking: Reported on 10/19/2021), Disp: 100 g, Rfl: 0   EPINEPHrine (EPIPEN 2-PAK) 0.3 mg/0.3 mL IJ SOAJ injection, Use as directed for severe allergic reaction, Disp: 2 each, Rfl: 1   fluticasone (FLONASE) 50 MCG/ACT nasal spray, Place 1 spray into both nostrils in the morning and at bedtime., Disp: 16 g, Rfl: 5   ipratropium (ATROVENT) 0.06 % nasal spray, Place 2  sprays into both nostrils 4 (four) times daily as needed (nasal drainage)., Disp: 15 mL, Rfl: 5   levothyroxine (SYNTHROID) 50 MCG tablet, Take 1 tablet (50 mcg total) by mouth daily before breakfast., Disp: 90 tablet, Rfl: 3   linaclotide (LINZESS) 72 MCG capsule, TAKE 1 CAPSULE BY MOUTH ONCE DAILY AS NEEDED (Patient not taking: Reported on 09/26/2021), Disp: 30 capsule, Rfl: 0   Magnesium Hydroxide (PHILLIPS MILK OF MAGNESIA PO), Take 15-30 mLs by mouth daily as needed (for constipation)., Disp: , Rfl:    meclizine (ANTIVERT) 25 MG tablet, Take 1 tablet (25 mg total) by mouth 2 (two) times daily as needed for dizziness., Disp: 10 tablet, Rfl: 0   memantine (NAMENDA) 5 MG tablet, Take 1 tablet (5 mg at night) for 2 weeks, then increase to 1 tablet (5mg ) twice a day, Disp: 60 tablet, Rfl: 11   montelukast (SINGULAIR) 10 MG tablet, Take 1 tablet (10 mg total) by mouth at bedtime., Disp: 90 tablet, Rfl: 1   ondansetron (ZOFRAN) 8 MG tablet, Take 1 tablet (8 mg total) by mouth every 8 (eight) hours as needed for nausea or vomiting., Disp: 20 tablet, Rfl: 0   pantoprazole (PROTONIX) 20 MG tablet, Take 1 tablet (20 mg total) by mouth daily., Disp: 14 tablet, Rfl: 0   polyethylene glycol (MIRALAX / GLYCOLAX) 17 g packet, Take 17 g by mouth daily as needed for mild constipation., Disp: , Rfl:    pravastatin (PRAVACHOL) 80 MG tablet, Take 1 tablet by mouth once daily, Disp: 90 tablet, Rfl: 3   SYMBICORT 80-4.5 MCG/ACT inhaler, INHALE 2 PUFFS BY MOUTH IN THE MORNING AND AT BEDTIME, Disp: 10.2 g, Rfl: 5   traMADol (ULTRAM) 50 MG tablet, Take 50 mg by mouth every 6 (six) hours as needed for moderate pain., Disp: , Rfl:   Current Facility-Administered Medications:    Benralizumab SOSY 30 mg, 30 mg, Subcutaneous, Q8 Weeks, Kennith Gain, MD, 30 mg at 03/05/22 0912    Garner Nash, DO [facility] 03/22/2022 10:30 AM

## 2022-03-23 ENCOUNTER — Ambulatory Visit: Payer: Medicare HMO | Admitting: Allergy

## 2022-03-28 ENCOUNTER — Telehealth: Payer: Self-pay

## 2022-03-30 ENCOUNTER — Ambulatory Visit: Payer: Medicare HMO | Admitting: Pulmonary Disease

## 2022-04-04 ENCOUNTER — Other Ambulatory Visit: Payer: Self-pay | Admitting: Internal Medicine

## 2022-04-04 NOTE — Telephone Encounter (Signed)
Please refill as per office routine med refill policy (all routine meds to be refilled for 3 mo or monthly (per pt preference) up to one year from last visit, then month to month grace period for 3 mo, then further med refills will have to be denied) ? ?

## 2022-04-06 ENCOUNTER — Encounter: Payer: Self-pay | Admitting: Internal Medicine

## 2022-04-10 ENCOUNTER — Ambulatory Visit: Payer: Medicare HMO | Admitting: Internal Medicine

## 2022-04-10 ENCOUNTER — Ambulatory Visit
Admission: RE | Admit: 2022-04-10 | Discharge: 2022-04-10 | Disposition: A | Discharge status: Home / Self Care | Payer: Medicare HMO | Source: Ambulatory Visit | Attending: Internal Medicine | Admitting: Internal Medicine

## 2022-04-10 ENCOUNTER — Other Ambulatory Visit: Payer: Self-pay | Admitting: Internal Medicine

## 2022-04-10 DIAGNOSIS — Z139 Encounter for screening, unspecified: Secondary | ICD-10-CM

## 2022-04-10 DIAGNOSIS — I700 Atherosclerosis of aorta: Secondary | ICD-10-CM | POA: Diagnosis not present

## 2022-04-10 DIAGNOSIS — C3490 Malignant neoplasm of unspecified part of unspecified bronchus or lung: Secondary | ICD-10-CM | POA: Diagnosis not present

## 2022-04-10 DIAGNOSIS — I3139 Other pericardial effusion (noninflammatory): Secondary | ICD-10-CM | POA: Diagnosis not present

## 2022-04-10 DIAGNOSIS — J479 Bronchiectasis, uncomplicated: Secondary | ICD-10-CM | POA: Diagnosis not present

## 2022-04-10 MED ORDER — IOPAMIDOL (ISOVUE-300) INJECTION 61%
60.0000 mL | Freq: Once | INTRAVENOUS | Status: AC | PRN
  Administered 2022-04-10: 60 mL via INTRAVENOUS

## 2022-04-10 NOTE — Progress Notes (Unsigned)
   I, Peterson Lombard, LAT, ATC acting as a scribe for Lynne Leader, MD.  OLUWASEYI TULL is a 81 y.o. female who presents to [place] [facility] today for L hip pain. Of note, pt has a hx of stage 3 lung cancer diagnosed in Jan 2022 and is under the care of oncology. Pt was previously seen by Dr. Georgina Snell on 09/28/21 for L shoulder pain. Today, pt c/o L hip pain x /. Pt locates pain to   LBP: Radiates:  LE Numbness/tingling: LE Weakness: Aggravates: Treatments tried:   Pertinent review of systems: ***  Relevant historical information: ***   Exam:  There were no vitals taken for this visit. General: Well Developed, well nourished, and in no acute distress.   MSK: ***    Lab and Radiology Results No results found for this or any previous visit (from the past 72 hour(s)). No results found.     Assessment and Plan: 81 y.o. female with ***   PDMP not reviewed this encounter. No orders of the defined types were placed in this encounter.  No orders of the defined types were placed in this encounter.    Discussed warning signs or symptoms. Please see discharge instructions. Patient expresses understanding.   ***

## 2022-04-11 ENCOUNTER — Ambulatory Visit (INDEPENDENT_AMBULATORY_CARE_PROVIDER_SITE_OTHER): Payer: Medicare HMO | Admitting: Family Medicine

## 2022-04-11 ENCOUNTER — Ambulatory Visit (INDEPENDENT_AMBULATORY_CARE_PROVIDER_SITE_OTHER): Payer: Medicare HMO

## 2022-04-11 VITALS — BP 130/74 | Ht 65.0 in | Wt 143.0 lb

## 2022-04-11 DIAGNOSIS — M545 Low back pain, unspecified: Secondary | ICD-10-CM

## 2022-04-11 DIAGNOSIS — G8929 Other chronic pain: Secondary | ICD-10-CM

## 2022-04-11 NOTE — Patient Instructions (Signed)
Thank you for coming in today.   I've referred you to Physical Therapy.  Let us know if you don't hear from them in one week.   Please get an Xray today before you leave   Recheck  in 6 weeks.

## 2022-04-16 NOTE — Progress Notes (Signed)
Lumbar spine x-ray shows multilevel arthritis changes

## 2022-04-17 ENCOUNTER — Encounter: Payer: Self-pay | Admitting: Neurology

## 2022-04-17 ENCOUNTER — Ambulatory Visit (INDEPENDENT_AMBULATORY_CARE_PROVIDER_SITE_OTHER): Payer: Medicare HMO | Admitting: Neurology

## 2022-04-17 VITALS — BP 136/81 | HR 80 | Ht 65.0 in | Wt 145.5 lb

## 2022-04-17 DIAGNOSIS — R413 Other amnesia: Secondary | ICD-10-CM

## 2022-04-17 DIAGNOSIS — F0280 Dementia in other diseases classified elsewhere without behavioral disturbance: Secondary | ICD-10-CM | POA: Diagnosis not present

## 2022-04-17 DIAGNOSIS — G309 Alzheimer's disease, unspecified: Secondary | ICD-10-CM

## 2022-04-17 MED ORDER — MEMANTINE HCL 10 MG PO TABS: 10.0000 mg | ORAL_TABLET | Freq: Two times a day (BID) | ORAL | 5 refills | Status: DC

## 2022-04-17 NOTE — Progress Notes (Signed)
[facility] [address] [place] [place] [address] [phone]       OFFICE FOLLOW-UP VISIT NOTE  [name] Date of Birth:  August 23, 1940 Medical Record Number:  [id number]   Referring MD:  Cathlean Cower  Reason for Referral:  Stroke and memory loss  HPI: Initial visit 3/25 2023 Ms. [name] is a pleasant 81 year old African-American lady with initial office consultation visit.  She is accompanied by 2 of her daughters.  History is obtained from them and review of electronic medical records and I have personally reviewed pertinent available imaging films in PACS.  She is a 81 year old African-American lady with past medical history of known lung cancer s/p chemotherapy, radiation.  Acid reflux, hypertension and memory loss.  Patient has had for the last 6 to 9 months progressive decline in memory and cognition.  Her short-term memory is quite poor though long-term he is fine.  He needs constant reminders and repetition for anything-year-old.  She does not do well with change.  She lives at home with her daughter and is mostly independent in all activities of daily living.  Her daughter is not in charge of her primary and arranging her medicines.  She has not exhibited any unsafe behaviors except a while back she left the faucet on and with the whole bathroom.  She denies any, delusions, hallucinations, agitation.  She does not wander off.  She has no prior history of significant head injury, loss of consciousness, seizures, stroke or TIA.  She and her daughter specifically denies any history of slurred speech, aphasia, vertigo, diplopia, focal extremity weakness, gait imbalance.  She had an MRI scan of the brain done on 07/18/2021 which I personally reviewed and shows only mild changes of small vessel disease and generalized atrophy.  I do not see a definite right cerebellar stroke but the radiologist has called 1 which apparently was not seen on previous MRI from 06/14/2020.   Patient has since been started on Plavix by Dr. Jenny Reichmann and is tolerating it well without bruising or bleeding.  Blood pressure is usually better controlled today it is elevated at 156/82.  Lab work on 07/18/2021 showed LDL cholesterol 97 mg percent, hemoglobin A1c 5.8.  Vitamin B12, vitamin D and free T4 levels are all normal.  She has not yet had an EEG.  She was started on Namenda starter pack presently taking 5 mg once a day.  Daughters are concerned about her gastric reflux and did not want to try Aricept.  On Mini-Mental status exam today she scored 18/30 and clock drawing 3/4.  On 07/20/2021 she scored 11/30 on MoCA testing with our urology office Family history of Alzheimer's in her father.  Update 04/17/2022 : She returns for follow-up after last visit 8 months ago.  She is accompanied by her daughter.  They feel her memory difficulties are more or less unchanged however on objective testing she has shown some decline on cognitive testing in the office today and Mini-Mental exam score was 15/30 compared to 18/30 at last visit.  She denies any delusions, hallucinations, agitation or unsafe behavior..  She is bothered by left hip pain and has started physical therapy for that.  Her cancer is stable and she has an upcoming visit to see Dr. [name] next month.  He is on Namenda 5 mg twice daily which is tolerating well without side effects but for unclear reasons she has never tried a higher dose.  She had EEG done on 08/08/2021 which  was normal.  CT angiogram on 08/10/2021 showed mild tortuosity of cervical carotid with no large vessel stenosis or occlusion.  She has no new complaints today.  ROS:   14 system review of systems is positive for memory loss, confusion and all other systems negative  PMH:  Past Medical History:  Diagnosis Date   Arthritis    Asthma    Bowel obstruction (HCC)    Cancer ([place])    Environmental allergies    GERD (gastroesophageal reflux disease)    Glaucoma 05/09/2017   H.  pylori infection    History of radiation therapy 07/05/20-08/05/20   Left lung IMRT Dr. Sondra Come    HLD (hyperlipidemia) 01/16/2019   Hypertension    Iron deficiency anemia    Osteopenia 05/26/2017   Thyroid disease    Urticaria 07/25/2018    Social History:  Social History   Socioeconomic History   Marital status: Widowed    Spouse name: Not on file   Number of children: 4   Years of education: 81   Highest education level: Not on file  Occupational History   Occupation: retired  Tobacco Use   Smoking status: Former    Types: Cigarettes   Smokeless tobacco: Never   Tobacco comments:    quit 60 years ago  Vaping Use   Vaping Use: Never used  Substance and Sexual Activity   Alcohol use: No   Drug use: No   Sexual activity: Never  Other Topics Concern   Not on file  Social History Narrative   Right handed   Caffeine on occ   One story home   Social Determinants of Health   Financial Resource Strain: Low Risk  (04/25/2021)   Overall Financial Resource Strain (CARDIA)    Difficulty of Paying Living Expenses: Not hard at all  Food Insecurity: No Food Insecurity (11/16/2021)   Hunger Vital Sign    Worried About Running Out of Food in the Last Year: Never true    [place] in the Last Year: Never true  Transportation Needs: No Transportation Needs (11/16/2021)   PRAPARE - Hydrologist (Medical): No    Lack of Transportation (Non-Medical): No  Physical Activity: Unknown (04/25/2021)   Exercise Vital Sign    Days of Exercise per Week: 0 days    Minutes of Exercise per Session: Not on file  Recent Concern: Physical Activity - Inactive (04/25/2021)   Exercise Vital Sign    Days of Exercise per Week: 0 days    Minutes of Exercise per Session: 30 min  Stress: No Stress Concern Present (04/25/2021)   [place]    Feeling of Stress : Not at all  Social Connections: Moderately  Isolated (04/25/2021)   Social Connection and Isolation Panel [NHANES]    Frequency of Communication with Friends and Family: More than three times a week    Frequency of Social Gatherings with Friends and Family: Twice a week    Attends Religious Services: More than 4 times per year    Active Member of [employer] or Organizations: No    Attends Archivist Meetings: Not on file    Marital Status: Widowed  Intimate Partner Violence: Not At Risk (02/27/2021)   Humiliation, Afraid, Rape, and Kick questionnaire    Fear of Current or Ex-Partner: No    Emotionally Abused: No    Physically Abused: No    Sexually Abused: No  Medications:   Current Outpatient Medications on File Prior to Visit  Medication Sig Dispense Refill   acetaminophen (TYLENOL) 500 MG tablet Take 500 mg by mouth every 6 (six) hours as needed for moderate pain.     alum & mag hydroxide-simeth (MAALOX/MYLANTA) 200-200-20 MG/5ML suspension Take 15 mLs by mouth every 6 (six) hours as needed for indigestion or heartburn.     amLODipine (NORVASC) 10 MG tablet Take 1 tablet (10 mg total) by mouth daily. 90 tablet 3   Benralizumab (FASENRA) 30 MG/ML SOSY Inject 1 mL (30 mg total) into the skin every 8 (eight) weeks. 1 mL 6   cetirizine (ZYRTEC ALLERGY) 10 MG tablet Take 1 tablet (10 mg total) by mouth daily. 90 tablet 1   clopidogrel (PLAVIX) 75 MG tablet Take 1 tablet (75 mg total) by mouth daily. 90 tablet 3   diclofenac Sodium (VOLTAREN) 1 % GEL Apply 4 g topically 4 (four) times daily. 100 g 0   EPINEPHrine (EPIPEN 2-PAK) 0.3 mg/0.3 mL IJ SOAJ injection Use as directed for severe allergic reaction 2 each 1   fluticasone (FLONASE) 50 MCG/ACT nasal spray Place 1 spray into both nostrils in the morning and at bedtime. 16 g 5   ipratropium (ATROVENT) 0.06 % nasal spray Place 2 sprays into both nostrils 4 (four) times daily as needed (nasal drainage). 15 mL 5   polyethylene glycol (MIRALAX / GLYCOLAX) 17 g packet Take 17 g by  mouth daily as needed for mild constipation.     pravastatin (PRAVACHOL) 80 MG tablet Take 1 tablet by mouth once daily 90 tablet 3   SYMBICORT 80-4.5 MCG/ACT inhaler INHALE 2 PUFFS BY MOUTH IN THE MORNING AND AT BEDTIME 10.2 g 5   traMADol (ULTRAM) 50 MG tablet Take 50 mg by mouth every 6 (six) hours as needed for moderate pain.     levothyroxine (SYNTHROID) 50 MCG tablet Take 1 tablet (50 mcg total) by mouth daily before breakfast. 90 tablet 3   linaclotide (LINZESS) 72 MCG capsule TAKE 1 CAPSULE BY MOUTH ONCE DAILY AS NEEDED 30 capsule 0   Magnesium Hydroxide (PHILLIPS MILK OF MAGNESIA PO) Take 15-30 mLs by mouth daily as needed (for constipation).     meclizine (ANTIVERT) 25 MG tablet Take 1 tablet (25 mg total) by mouth 2 (two) times daily as needed for dizziness. 10 tablet 0   ondansetron (ZOFRAN) 8 MG tablet Take 1 tablet (8 mg total) by mouth every 8 (eight) hours as needed for nausea or vomiting. 20 tablet 0   Current Facility-Administered Medications on File Prior to Visit  Medication Dose Route Frequency Provider Last Rate Last Admin   Benralizumab SOSY 30 mg  30 mg Subcutaneous Q8 Weeks Kennith Gain, MD   30 mg at 03/05/22 1610    Allergies:   Allergies  Allergen Reactions   Asa Buff (Mag [Buffered Aspirin] Other (See Comments)    Excessive sweating   Ensure [Nutritional Supplements]     Or boost - upset stomach    Fish Allergy Other (See Comments)    Unknown reaction, but allergic   Other Other (See Comments)    Gelcaps; gel-containing capsules; extended-release medication - upset stomach    Peanut-Containing Drug Products Other (See Comments)    From allergy test   Penicillins Itching    Has patient had a PCN reaction causing immediate rash, facial/tongue/throat swelling, SOB or lightheadedness with hypotension: No Has patient had a PCN reaction causing severe rash involving mucus membranes or  skin necrosis: No Has patient had a PCN reaction that required  hospitalization No Has patient had a PCN reaction occurring within the last 10 years: No If all of the above answers are "NO", then may proceed with Cephalosporin use.   Shellfish-Derived Products Other (See Comments)    From allergy test   Strawberry Extract Other (See Comments)    From allergy test     Physical Exam General: Frail elderly African-American lady seated, in no evident distress Head: head normocephalic and atraumatic.   Neck: supple with no carotid or supraclavicular bruits Cardiovascular: regular rate and rhythm, no murmurs Musculoskeletal: no deformity Skin:  no rash/petichiae Vascular:  Normal pulses all extremities  Neurologic Exam Mental Status: Awake and fully alert. Oriented to place and time. Recent and remote memory  poor. Attention span, concentration and fund of knowledge diminished. Mood and affect appropriate.  Mini-Mental status exam scored 15/30.  Clock drawing 3/4.  Able to name 13 animals that can walk on 4 legs Cranial Nerves: Fundoscopic exam reveals sharp disc margins. Pupils equal, briskly reactive to light. Extraocular movements full without nystagmus. Visual fields full to confrontation. Hearing intact. Facial sensation intact. Face, tongue, palate moves normally and symmetrically.  Motor: Normal bulk and tone. Normal strength in all tested extremity muscles. Sensory.: intact to touch , pinprick , position and vibratory sensation.  Coordination: Rapid alternating movements normal in all extremities. Finger-to-nose and heel-to-shin performed accurately bilaterally. Gait and Station: Arises from chair without difficulty. Stance is normal. Gait demonstrates normal stride length and balance . Able to heel, toe and tandem walk without difficulty.  Reflexes: 1+ and symmetric. Toes downgoing.   NIHSS  0 Modified Rankin  1    04/17/2022    1:33 PM 03/05/2022    3:30 PM 08/07/2021   11:18 AM  MMSE - Mini Mental State Exam  Not completed:  Unable to  complete   Orientation to time 0  2  Orientation to Place 5  5  Registration 3  3  Attention/ Calculation 0  0  Recall 0  0  Language- name 2 objects 2  2  Language- repeat 1  1  Language- follow 3 step command 3  3  Language- read & follow direction 1  1  Write a sentence 0  1  Copy design 0  0  Copy design-comments   Four-legged animals x 1 min: 11  Total score 15  18     ASSESSMENT: 81 year old African-American lady with subacute memory and cognitive decline secondary to 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  Abnormal report of MRI of the brain raising concerns of cerebellar stroke but not convincing by my assessment and patient has no clinical history of TIA or stroke.  Vascular risk factors of age, mild hyperlipidemia and recent lung cancer.  She has shown some decline in MMSE exam today compared to last visit    PLAN:I had a long discussion with the patient and her daughter regarding her memory loss and cognitive impairment which appears to have progressed slightly and testing in the office today.  I recommend she increase the dose of Namenda to 10 mg twice daily if tolerated.  Continue participation in cognitively challenging activities like solving crossword puzzles, playing bridge and sudoku.  We also discussed memory compensation.  She will return for follow-up in the future in months or call earlier if necessary. Greater than 50% time during this 40 minute  visit with spent on counseling and coordination of care about her cognitive impairment, dementia Antony Contras, MD  Note: This document was prepared with digital dictation and possible smart phrase technology. Any transcriptional errors that result from this process are unintentional.

## 2022-04-17 NOTE — Patient Instructions (Signed)
I had a long discussion with the patient and her daughter regarding her memory loss and cognitive impairment which appears to have progressed slightly and testing in the office today.  I recommend she increase the dose of Namenda to 10 mg twice daily if tolerated.  Continue participation in cognitively challenging activities like solving crossword puzzles, playing bridge and sudoku.  We also discussed memory compensation.  She will return for follow-up in the future in months or call earlier if necessary.  Memory Compensation Strategies  Use "WARM" strategy.  W= write it down  A= associate it  R= repeat it  M= make a mental note  2.   You can keep a Social worker.  Use a 3-ring notebook with sections for the following: calendar, important names and phone numbers,  medications, doctors' names/phone numbers, lists/reminders, and a section to journal what you did  each day.   3.    Use a calendar to write appointments down.  4.    Write yourself a schedule for the day.  This can be placed on the calendar or in a separate section of the Memory Notebook.  Keeping a  regular schedule can help memory.  5.    Use medication organizer with sections for each day or morning/evening pills.  You may need help loading it  6.    Keep a basket, or pegboard by the door.  Place items that you need to take out with you in the basket or on the pegboard.  You may also want to  include a message board for reminders.  7.    Use sticky notes.  Place sticky notes with reminders in a place where the task is performed.  For example: " turn off the  stove" placed by the stove, "lock the door" placed on the door at eye level, " take your medications" on  the bathroom mirror or by the place where you normally take your medications.  8.    Use alarms/timers.  Use while cooking to remind yourself to check on food or as a reminder to take your medicine, or as a  reminder to make a call, or as a reminder to perform  another task, etc.

## 2022-04-18 NOTE — Therapy (Signed)
OUTPATIENT PHYSICAL THERAPY THORACOLUMBAR EVALUATION   Patient Name: Angelica [name] MRN: [id number] DOB:August 30, 1940, 81 y.o., female Today's Date: 04/18/2022  END OF SESSION:   Past Medical History:  Diagnosis Date   Arthritis    Asthma    Bowel obstruction ([place])    Cancer ([place])    Environmental allergies    GERD (gastroesophageal reflux disease)    Glaucoma 05/09/2017   H. pylori infection    History of radiation therapy 07/05/20-08/05/20   Left lung IMRT Dr. Sondra Come    HLD (hyperlipidemia) 01/16/2019   Hypertension    Iron deficiency anemia    Osteopenia 05/26/2017   Thyroid disease    Urticaria 07/25/2018   Past Surgical History:  Procedure Laterality Date   ABDOMINAL HYSTERECTOMY     BOWEL RESECTION N/A 05/23/2020   Procedure: SMALL BOWEL REPAIR;  Surgeon: Kieth Brightly Arta Bruce, MD;  Location: [place];  Service: General;  Laterality: N/A;   BREAST BIOPSY     BRONCHIAL BIOPSY  06/10/2020   Procedure: BRONCHIAL BIOPSIES;  Surgeon: Garner Nash, DO;  Location: [facility];  Service: Pulmonary;;   BRONCHIAL BRUSHINGS  06/10/2020   Procedure: BRONCHIAL BRUSHINGS;  Surgeon: Garner Nash, DO;  Location: [facility] [facility];  Service: Pulmonary;;   BRONCHIAL NEEDLE ASPIRATION BIOPSY  06/10/2020   Procedure: BRONCHIAL NEEDLE ASPIRATION BIOPSIES;  Surgeon: Garner Nash, DO;  Location: [place] [facility];  Service: Pulmonary;;   BRONCHIAL WASHINGS  06/10/2020   Procedure: BRONCHIAL WASHINGS;  Surgeon: Garner Nash, DO;  Location: [facility];  Service: Pulmonary;;   COLON SURGERY     DIAGNOSTIC LARYNGOSCOPY N/A 05/23/2020   Procedure: ATTEMPTED DIAGNOSTIC LAPAROSCOPY WITH ADHESIONS;  Surgeon: Kieth Brightly, Arta Bruce, MD;  Location: [facility];  Service: General;  Laterality: N/A;   IR IMAGING GUIDED PORT INSERTION  07/15/2020   KNEE SURGERY     LAPAROTOMY N/A 04/18/2015   Procedure: Exploratory laparotomy with lysis of adhesions, possible bowel resection;  Surgeon: Ralene Ok, MD;   Location: [facility];  Service: General;  Laterality: N/A;   LAPAROTOMY N/A 05/23/2020   Procedure: EXPLORATORY LAPAROTOMY;  Surgeon: Mickeal Skinner, MD;  Location: [facility];  Service: General;  Laterality: N/A;   LYSIS OF ADHESION N/A 05/23/2020   Procedure: LYSIS OF ADHESION;  Surgeon: Kieth Brightly Arta Bruce, MD;  Location: [place];  Service: General;  Laterality: N/A;   NASAL SINUS SURGERY     THYROID SURGERY     VIDEO BRONCHOSCOPY WITH ENDOBRONCHIAL NAVIGATION Bilateral 06/10/2020   Procedure: VIDEO BRONCHOSCOPY WITH ENDOBRONCHIAL NAVIGATION;  Surgeon: Garner Nash, DO;  Location: [place];  Service: Pulmonary;  Laterality: Bilateral;   VIDEO BRONCHOSCOPY WITH ENDOBRONCHIAL ULTRASOUND  06/10/2020   Procedure: VIDEO BRONCHOSCOPY WITH ENDOBRONCHIAL ULTRASOUND;  Surgeon: Garner Nash, DO;  Location: [facility];  Service: Pulmonary;;   Patient Active Problem List   Diagnosis Date Noted   Right leg pain 07/13/2021   Memory loss 07/13/2021   RLQ abdominal pain 07/13/2021   Encounter for well adult exam with abnormal findings 06/10/2021   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   Anosmia 05/01/2021   COVID-19 04/25/2021   Debility 02/01/2021   Gait disorder 02/01/2021   Malignant neoplasm of bronchus and lung ([place])    Port-A-Cath in place 11/02/2020   Adenocarcinoma of left lung, stage 3 ([place]) 06/22/2020   Lung mass    Pulmonary nodule 05/17/2020   Iron deficiency 05/03/2020   Swelling of lower extremity 07/30/2019   Ganglion cyst of right foot 01/16/2019  HLD (hyperlipidemia) 01/16/2019   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 pylori infection    Other allergic rhinitis 03/20/2016   Malnutrition of moderate degree 04/11/2015    HTN (hypertension) 04/10/2015    PCP: [name], MD  REFERRING PROVIDER: Gregor Hams, MD  REFERRING DIAG: Chronic bilateral low back pain without sciatica  Rationale for Evaluation and Treatment: Rehabilitation  THERAPY DIAG:  No diagnosis found.  ONSET DATE: ***   SUBJECTIVE:           SUBJECTIVE STATEMENT: ***  PERTINENT HISTORY:  ***  PAIN:  Are you having pain? Yes:  NPRS scale: ***/10 Pain location: *** Pain description: *** Aggravating factors: *** Relieving factors: ***  PRECAUTIONS: {Therapy precautions:24002}  WEIGHT BEARING RESTRICTIONS: No  FALLS:  Has patient fallen in last 6 months? {fallsyesno:27318}  LIVING ENVIRONMENT: Lives with: {[facility] lives with:25569::"lives with their family"} Lives in: {Lives in:25570} Stairs: {opstairs:27293} Has following equipment at home: {Assistive devices:23999}  PLOF: {PLOF:24004}  PATIENT GOALS: ***  NEXT MD VISIT:    OBJECTIVE:  DIAGNOSTIC FINDINGS:  ***  PATIENT SURVEYS:  {rehab surveys:24030}  SCREENING FOR RED FLAGS: Negative  COGNITION: Overall cognitive status: {cognition:24006}     SENSATION: {sensation:27233}  MUSCLE LENGTH: ***  POSTURE:  ***  PALPATION: ***  LUMBAR ROM:   AROM eval  Flexion   Extension   Right lateral flexion   Left lateral flexion   Right rotation   Left rotation    (Blank rows = not tested)  LOWER EXTREMITY ROM:     {AROM/PROM:27142}  Right eval Left eval  Hip flexion    Hip extension    Hip abduction    Hip adduction    Hip internal rotation    Hip external rotation    Knee flexion    Knee extension    Ankle dorsiflexion    Ankle plantarflexion    Ankle inversion    Ankle eversion     (Blank rows = not tested)  LOWER EXTREMITY MMT:    MMT Right eval Left eval  Hip flexion    Hip extension    Hip abduction    Hip adduction    Hip internal rotation    Hip external rotation    Knee flexion    Knee extension    Ankle  dorsiflexion    Ankle plantarflexion    Ankle inversion    Ankle eversion     (Blank rows = not tested)  LUMBAR SPECIAL TESTS:  {lumbar special test:25242}  FUNCTIONAL TESTS:  {Functional tests:24029}  GAIT: Distance walked: *** Assistive device utilized: {Assistive devices:23999} Level of assistance: {Levels of assistance:24026} Comments: ***   TODAY'S TREATMENT [facility] Adult PT Treatment:                                                DATE: 04/18/2022 Therapeutic Exercise: ***  PATIENT EDUCATION:  Education details: Exam findings, POC, HEP Person educated: Patient Education method: Explanation, Demonstration, Tactile cues, Verbal cues, and Handouts Education comprehension: verbalized understanding, returned demonstration, verbal cues required, tactile cues required, and needs further education  HOME EXERCISE PROGRAM: ***   ASSESSMENT:  CLINICAL IMPRESSION: Patient is a 81 y.o. female who was seen today for physical therapy evaluation and treatment for chronic low back pain. ***   OBJECTIVE IMPAIRMENTS: {opptimpairments:25111}.   ACTIVITY LIMITATIONS: {activitylimitations:27494}  PARTICIPATION LIMITATIONS: {participationrestrictions:25113}  PERSONAL FACTORS: {Personal factors:25162} are also affecting patient's functional outcome.   REHAB POTENTIAL: {rehabpotential:25112}  CLINICAL DECISION MAKING: {clinical decision making:25114}  EVALUATION COMPLEXITY: {Evaluation complexity:25115}   GOALS: Goals reviewed with patient? Yes  SHORT TERM GOALS: Target date: ***  Patient will be I with initial HEP in order to progress with therapy. Baseline: HEP provided at eval Goal status: INITIAL  2.  *** Baseline:  Goal status: INITIAL  3.  *** Baseline:  Goal status: INITIAL  LONG TERM GOALS: Target date: ***  Patient will be I with final HEP to maintain progress from PT. Baseline: HEP provided at eval Goal status: INITIAL  2.  *** Baseline:  Goal status:  INITIAL  3.  *** Baseline:  Goal status: INITIAL  4.  *** Baseline:  Goal status: INITIAL   PLAN: PT FREQUENCY: {rehab frequency:25116}  PT DURATION: {rehab duration:25117}  PLANNED INTERVENTIONS: {rehab planned interventions:25118::"Therapeutic exercises","Therapeutic activity","Neuromuscular re-education","Balance training","Gait training","Patient/Family education","Self Care","Joint mobilization"}.  PLAN FOR NEXT SESSION: Review HEP and progress PRN, ***   Hilda Blades, PT, DPT, LAT, ATC 04/18/22  9:16 AM Phone: [phone] Fax: [phone]   Referring diagnosis? *** Treatment diagnosis? (if different than referring diagnosis) *** What was this (referring dx) caused by? []  Surgery []  Fall [x]  Ongoing issue []  Arthritis []  Other: ____________  Laterality: []  Rt []  Lt [x]  Both  Check all possible CPT codes:  *CHOOSE 10 OR LESS*    []  97110 (Therapeutic Exercise)  []  92507 (SLP Treatment)  []  97112 (Neuro Re-ed)   []  92526 (Swallowing Treatment)   []  97116 (Gait Training)   []  [id number] (Cognitive Training, 1st 15 minutes) []  88325 (Manual Therapy)   []  97130 (Cognitive Training, each add'l 15 minutes)  []  97164 (Re-evaluation)                              []  Other, List CPT Code ____________  []  49826 (Therapeutic Activities)     []  97535 (Self Care)   [x]  All codes above (97110 - 97535)  []  97012 (Mechanical Traction)  []  97014 (E-stim Unattended)  []  97032 (E-stim manual)  []  97033 (Ionto)  []  97035 (Ultrasound) []  97750 (Physical Performance Training) [x]  [id number] (Aquatic Therapy) []  97016 (Vasopneumatic Device) []  [id number] (Paraffin) []  97034 (Contrast Bath) []  97597 (Wound Care 1st 20 sq cm) []  97598 (Wound Care each add'l 20 sq cm) []  97760 (Orthotic Fabrication, Fitting, Training Initial) []  [id number] (Prosthetic Management and Training Initial) []  [id number] (Orthotic or Prosthetic Training/ Modification Subsequent)

## 2022-04-19 ENCOUNTER — Encounter: Payer: Self-pay | Admitting: Physical Therapy

## 2022-04-19 ENCOUNTER — Other Ambulatory Visit: Payer: Self-pay

## 2022-04-19 ENCOUNTER — Ambulatory Visit: Payer: Medicare HMO | Attending: Family Medicine | Admitting: Physical Therapy

## 2022-04-19 DIAGNOSIS — M6281 Muscle weakness (generalized): Secondary | ICD-10-CM | POA: Diagnosis not present

## 2022-04-19 DIAGNOSIS — M25552 Pain in left hip: Secondary | ICD-10-CM | POA: Diagnosis not present

## 2022-04-19 DIAGNOSIS — M5459 Other low back pain: Secondary | ICD-10-CM | POA: Diagnosis not present

## 2022-04-19 DIAGNOSIS — G8929 Other chronic pain: Secondary | ICD-10-CM | POA: Insufficient documentation

## 2022-04-19 DIAGNOSIS — M545 Low back pain, unspecified: Secondary | ICD-10-CM | POA: Insufficient documentation

## 2022-04-19 NOTE — Patient Instructions (Signed)
Access Code: [email or web address] URL: [email or web address] Date: 04/19/2022 Prepared by: Hilda Blades  Exercises - Supine Piriformis Stretch with Foot on Ground  - 2 x daily - 3 reps - 20 seconds hold - Supine Figure 4 Piriformis Stretch  - 2 x daily - 3 reps - 20 seconds hold - Supine Lower Trunk Rotation  - 2 x daily - 5 reps - 5 seconds hold - Active Straight Leg Raise with Quad Set  - 1 x daily - 2 sets - 10 reps - Bridge  - 1 x daily - 2 sets - 10 reps - Clamshell  - 1 x daily - 2 sets - 15 reps

## 2022-04-23 ENCOUNTER — Other Ambulatory Visit: Payer: Self-pay

## 2022-04-23 ENCOUNTER — Telehealth: Payer: Self-pay | Admitting: Family Medicine

## 2022-04-23 DIAGNOSIS — M25512 Pain in left shoulder: Secondary | ICD-10-CM

## 2022-04-23 MED ORDER — DICLOFENAC SODIUM 1 % EX GEL: 4.0000 g | Freq: Four times a day (QID) | CUTANEOUS | 0 refills | Status: AC

## 2022-04-23 NOTE — Telephone Encounter (Signed)
Daughter notified of rx fill via phone.

## 2022-04-23 NOTE — Telephone Encounter (Signed)
Pt daughter called, mom doing better but would like a refill of the Voltaren. This is on her med list, was previously rx'd at the ED.  I advised this med is OTC, but pt daughter states RX needed.   [name]

## 2022-04-24 ENCOUNTER — Encounter: Payer: Self-pay | Admitting: Physical Therapy

## 2022-04-24 ENCOUNTER — Ambulatory Visit: Payer: Medicare HMO | Attending: Family Medicine | Admitting: Physical Therapy

## 2022-04-24 DIAGNOSIS — M6281 Muscle weakness (generalized): Secondary | ICD-10-CM | POA: Insufficient documentation

## 2022-04-24 DIAGNOSIS — M5459 Other low back pain: Secondary | ICD-10-CM | POA: Insufficient documentation

## 2022-04-24 DIAGNOSIS — M25552 Pain in left hip: Secondary | ICD-10-CM | POA: Insufficient documentation

## 2022-04-24 NOTE — Therapy (Signed)
OUTPATIENT PHYSICAL THERAPY TREATMENT NOTE   Patient Name: Angelica [name] MRN: [id number] DOB:11-26-1940, 81 y.o., female Today's Date: 04/24/2022  PCP: [name], MD   REFERRING PROVIDER: Gregor Hams, MD    END OF SESSION:   PT End of Session - 04/24/22 0849     Visit Number 2    Number of Visits 9    Date for PT Re-Evaluation 06/14/22    Authorization Type [employer] MCR    Authorization Time Period 04/24/22-06/16/21; 8 visits    Authorization - Visit Number 1    Authorization - Number of Visits 8    PT Start Time 0848    PT Stop Time 0930    PT Time Calculation (min) 42 min             Past Medical History:  Diagnosis Date   Arthritis    Asthma    Bowel obstruction ([place])    Cancer ([place])    Environmental allergies    GERD (gastroesophageal reflux disease)    Glaucoma 05/09/2017   H. pylori infection    History of radiation therapy 07/05/20-08/05/20   Left lung IMRT Dr. Sondra Come    HLD (hyperlipidemia) 01/16/2019   Hypertension    Iron deficiency anemia    Osteopenia 05/26/2017   Thyroid disease    Urticaria 07/25/2018   Past Surgical History:  Procedure Laterality Date   ABDOMINAL HYSTERECTOMY     BOWEL RESECTION N/A 05/23/2020   Procedure: SMALL BOWEL REPAIR;  Surgeon: Kieth Brightly Arta Bruce, MD;  Location: [facility];  Service: General;  Laterality: N/A;   BREAST BIOPSY     BRONCHIAL BIOPSY  06/10/2020   Procedure: BRONCHIAL BIOPSIES;  Surgeon: Garner Nash, DO;  Location: [facility];  Service: Pulmonary;;   BRONCHIAL BRUSHINGS  06/10/2020   Procedure: BRONCHIAL BRUSHINGS;  Surgeon: Garner Nash, DO;  Location: [facility];  Service: Pulmonary;;   BRONCHIAL NEEDLE ASPIRATION BIOPSY  06/10/2020   Procedure: BRONCHIAL NEEDLE ASPIRATION BIOPSIES;  Surgeon: Garner Nash, DO;  Location: [facility];  Service: Pulmonary;;   BRONCHIAL WASHINGS  06/10/2020   Procedure: BRONCHIAL WASHINGS;  Surgeon: Garner Nash, DO;  Location: [facility];  Service:  Pulmonary;;   COLON SURGERY     DIAGNOSTIC LARYNGOSCOPY N/A 05/23/2020   Procedure: ATTEMPTED DIAGNOSTIC LAPAROSCOPY WITH ADHESIONS;  Surgeon: Kieth Brightly, Arta Bruce, MD;  Location: [facility];  Service: General;  Laterality: N/A;   IR IMAGING GUIDED PORT INSERTION  07/15/2020   KNEE SURGERY     LAPAROTOMY N/A 04/18/2015   Procedure: Exploratory laparotomy with lysis of adhesions, possible bowel resection;  Surgeon: Ralene Ok, MD;  Location: [facility];  Service: General;  Laterality: N/A;   LAPAROTOMY N/A 05/23/2020   Procedure: EXPLORATORY LAPAROTOMY;  Surgeon: Mickeal Skinner, MD;  Location: [facility];  Service: General;  Laterality: N/A;   LYSIS OF ADHESION N/A 05/23/2020   Procedure: LYSIS OF ADHESION;  Surgeon: Kieth Brightly Arta Bruce, MD;  Location: [facility];  Service: General;  Laterality: N/A;   NASAL SINUS SURGERY     THYROID SURGERY     VIDEO BRONCHOSCOPY WITH ENDOBRONCHIAL NAVIGATION Bilateral 06/10/2020   Procedure: VIDEO BRONCHOSCOPY WITH ENDOBRONCHIAL NAVIGATION;  Surgeon: Garner Nash, DO;  Location: [facility];  Service: Pulmonary;  Laterality: Bilateral;   VIDEO BRONCHOSCOPY WITH ENDOBRONCHIAL ULTRASOUND  06/10/2020   Procedure: VIDEO BRONCHOSCOPY WITH ENDOBRONCHIAL ULTRASOUND;  Surgeon: Garner Nash, DO;  Location: [facility];  Service: Pulmonary;;   Patient Active Problem List  Diagnosis Date Noted   Right leg pain 07/13/2021   Memory loss 07/13/2021   RLQ abdominal pain 07/13/2021   Encounter for well adult exam with abnormal findings 06/10/2021   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   Anosmia 05/01/2021   COVID-19 04/25/2021   Debility 02/01/2021   Gait disorder 02/01/2021   Malignant neoplasm of bronchus and lung ([place])    Port-A-Cath in place 11/02/2020   Adenocarcinoma of left lung, stage 3 ([place]) 06/22/2020   Lung mass    Pulmonary nodule 05/17/2020   Iron deficiency 05/03/2020   Swelling of lower extremity 07/30/2019   Ganglion cyst of right foot 01/16/2019    HLD (hyperlipidemia) 01/16/2019   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 pylori infection    Other allergic rhinitis 03/20/2016   Malnutrition of moderate degree 04/11/2015   HTN (hypertension) 04/10/2015    REFERRING DIAG: Chronic bilateral low back pain without sciatica  THERAPY DIAG:  Other low back pain  Pain in left hip  Muscle weakness (generalized)  Rationale for Evaluation and Treatment Rehabilitation  PERTINENT HISTORY:  Hx of lung cancer  PRECAUTIONS: None  SUBJECTIVE:                                                                                                                                                                                      SUBJECTIVE STATEMENT:  The back is still a little sore but it is better.    PAIN:  Are you having pain? Yes:  NPRS scale: 8/10 Pain location: Low back, left hip Pain description: Sharp Aggravating factors: Walking, sitting, lying on left side, straightening up or sitting up tall Relieving factors: Cream   OBJECTIVE: (objective measures completed at initial evaluation unless otherwise dated)   DIAGNOSTIC FINDINGS:  X-ray lumbar 04/11/2022: FINDINGS: There is no evidence of lumbar spine fracture. There is grade 1 anterolisthesis at L4-L5. Multilevel intervertebral disc space narrowing, degenerative endplate changes, and facet arthropathy, most pronounced at L5-S1   PATIENT SURVEYS:  FOTO 19% functional status   SCREENING FOR RED FLAGS: Negative   COGNITION: Overall cognitive status: History of cognitive impairments - at baseline                               SENSATION: WFL   MUSCLE LENGTH: Limited hip flexor and quad flexibility   POSTURE:  Patient  with decreased lumbar lordosis, slightly forward  trunk lean   PALPATION: Tender mildly left lumbar paraspinals, more to left gluteal region   LUMBAR ROM:    AROM eval  Flexion WFL  Extension < 25%  Right lateral flexion 50%  Left lateral flexion 50%  Right rotation 50%  Left rotation 50%            Pain mainly with lumbar extension   LOWER EXTREMITY ROM:                          Hip ROM grossly WFL   LOWER EXTREMITY MMT:     MMT Right eval Left eval  Hip flexion 4 4  Hip extension 3- 3-  Hip abduction 3+ 3  Knee flexion 5 5  Knee extension 5 5    FUNCTIONAL TESTS:  5 times sit to stand: 28 seconds   GAIT: Assistive device utilized: None Level of assistance: Complete Independence Comments: Slight forward trunk lean, antalgic on left     TODAY'S TREATMENT [facility] Adult PT Treatment:                                                DATE: 04/24/22 Therapeutic Exercise: LTR x 10  Side Clam 10 x 2  Piriformis stretch Bridge x 10 SLR 10 x 2  Figure 4 push  Manual Therapy: STW to left low back/lateral hip  Self Care: Instruction in self massage with tennis ball   [facility] Adult PT Treatment:                                                DATE: 04/19/2022 Therapeutic Exercise: Piriformis push/push stretch 2 x 20 sec each LTR 5 x 5 sec SLR x 10 Bridge x 10 Side clamshell x 10   PATIENT EDUCATION:  Education details: Exam findings, POC, HEP Person educated: Patient Education method: Explanation, Demonstration, Tactile cues, Verbal cues, and Handouts Education comprehension: verbalized understanding, returned demonstration, verbal cues required, tactile cues required, and needs further education   HOME EXERCISE PROGRAM:  Access Code: [email or web address] URL: [email or web address] Date: 04/24/2022 Prepared by: Hessie Diener  Exercises - Supine Piriformis Stretch with Foot on Ground  - 2 x daily - 3 reps - 20 seconds hold - Supine Figure 4 Piriformis Stretch   - 2 x daily - 3 reps - 20 seconds hold - Supine Lower Trunk Rotation  - 2 x daily - 5 reps - 5 seconds hold - Active Straight Leg Raise with Quad Set  - 1 x daily - 2 sets - 10 reps - Bridge  - 1 x daily - 2 sets - 10 reps - Clamshell  - 1 x daily - 2 sets - 15 reps     ASSESSMENT: CLINICAL IMPRESSION: Patient is a 81 y.o. female who was seen today for physical therapy  treatment for chronic left sided low back pain and hip. She reports compliance with HEP and a decrease in pain. Still rates pain at 8/10, compared to 9/10 at eval. Time spent with review of HEP today and began STW to left low back and lateral hip. She was instructed in how to use a tennis ball for self massage and it was issued for  home use. She reported soreness in left low back with therex however completed all prescribed reps. Reviewed FOTO intake and prediction. STG# 2 met. At end of session she reported continued soreness.    OBJECTIVE IMPAIRMENTS: Abnormal gait, decreased activity tolerance, decreased mobility, decreased ROM, decreased strength, impaired flexibility, postural dysfunction, and pain.    ACTIVITY LIMITATIONS: bending, sitting, standing, squatting, sleeping, stairs, and locomotion level   PARTICIPATION LIMITATIONS: meal prep, cleaning, shopping, and community activity   PERSONAL FACTORS: Fitness, Past/current experiences, Time since onset of injury/illness/exacerbation, and 3+ comorbidities: see PMH above  are also affecting patient's functional outcome.    REHAB POTENTIAL: Good   CLINICAL DECISION MAKING: Stable/uncomplicated   EVALUATION COMPLEXITY: Low     GOALS: Goals reviewed with patient? Yes   SHORT TERM GOALS: Target date: 05/17/2022   Patient will be I with initial HEP in order to progress with therapy. Baseline: HEP provided at eval Goal status: INITIAL   2.  PT will review FOTO with patient by 3rd visit in order to understand expected progress and outcome with therapy. Baseline: FOTO  assessed at eval Status: reviewed at 2nd visit Goal status: MET  3.  Patient will report left lower back and hip pain </= 5/10 with walking and activity in order to reduce functional limitations Baseline: 8-9/10 pain level Goal status: INITIAL   LONG TERM GOALS: Target date: 06/14/2022   Patient will be I with final HEP to maintain progress from PT. Baseline: HEP provided at eval Goal status: INITIAL   2.  Patient will report >/= 40% status on FOTO to indicate improved functional ability. Baseline: 19% functional status Goal status: INITIAL   3.  Patient will demonstrate hip strength >/= 4-/5 MMT in order to improve walking tolerance and reduce pain with household tasks Baseline: strength deficits noted above Goal status: INITIAL   4.  Patient will perform 5xSTS </= 15 seconds in order to indicate improved strength and reduced fall risk Baseline: 28 seconds Goal status: INITIAL     PLAN: PT FREQUENCY: 1x/week   PT DURATION: 8 weeks   PLANNED INTERVENTIONS: Therapeutic exercises, Therapeutic activity, Neuromuscular re-education, Balance training, Gait training, Patient/Family education, Self Care, Joint mobilization, Joint manipulation, Aquatic Therapy, Dry Needling, Electrical stimulation, Spinal manipulation, Spinal mobilization, Cryotherapy, Moist heat, Manual therapy, and Re-evaluation.   PLAN FOR NEXT SESSION: Review HEP and progress PRN, manual for left hip and low back, progress hip strength and core stabilization     Hessie Diener, PTA 04/24/22 9:56 AM Phone: [phone] Fax: [phone]

## 2022-04-30 ENCOUNTER — Ambulatory Visit (INDEPENDENT_AMBULATORY_CARE_PROVIDER_SITE_OTHER): Payer: Medicare HMO

## 2022-04-30 DIAGNOSIS — J4550 Severe persistent asthma, uncomplicated: Secondary | ICD-10-CM

## 2022-05-03 ENCOUNTER — Ambulatory Visit: Payer: Medicare HMO | Admitting: Physical Therapy

## 2022-05-03 ENCOUNTER — Encounter: Payer: Self-pay | Admitting: Pulmonary Disease

## 2022-05-03 ENCOUNTER — Other Ambulatory Visit: Payer: Self-pay | Admitting: Physician Assistant

## 2022-05-03 ENCOUNTER — Encounter: Payer: Self-pay | Admitting: Physical Therapy

## 2022-05-03 ENCOUNTER — Ambulatory Visit: Payer: Medicare HMO | Admitting: Pulmonary Disease

## 2022-05-03 VITALS — BP 138/78 | HR 79 | Ht 65.0 in | Wt 143.0 lb

## 2022-05-03 DIAGNOSIS — J4550 Severe persistent asthma, uncomplicated: Secondary | ICD-10-CM

## 2022-05-03 DIAGNOSIS — Z923 Personal history of irradiation: Secondary | ICD-10-CM | POA: Diagnosis not present

## 2022-05-03 DIAGNOSIS — M6281 Muscle weakness (generalized): Secondary | ICD-10-CM

## 2022-05-03 DIAGNOSIS — M5459 Other low back pain: Secondary | ICD-10-CM | POA: Diagnosis not present

## 2022-05-03 DIAGNOSIS — M25552 Pain in left hip: Secondary | ICD-10-CM

## 2022-05-03 DIAGNOSIS — C3492 Malignant neoplasm of unspecified part of left bronchus or lung: Secondary | ICD-10-CM | POA: Diagnosis not present

## 2022-05-03 DIAGNOSIS — R918 Other nonspecific abnormal finding of lung field: Secondary | ICD-10-CM | POA: Diagnosis not present

## 2022-05-03 NOTE — Telephone Encounter (Signed)
Was seen on 05/03/2022

## 2022-05-03 NOTE — Patient Instructions (Signed)
Thank you for visiting Dr. Valeta Harms at [facility]. Today we recommend the following:  Continue oncology follow ups Continue symbicort inhaler Albuterol as needed  Continue flonase   Return in about 1 year (around 05/04/2023) for with APP or Dr. Valeta Harms.    Please do your part to reduce the spread of COVID-19.

## 2022-05-03 NOTE — Therapy (Signed)
OUTPATIENT PHYSICAL THERAPY TREATMENT NOTE   Patient Name: Angelica [name] MRN: [id number] DOB:1941-04-23, 81 y.o., female Today's Date: 05/03/2022  PCP: [name], MD   REFERRING PROVIDER: Gregor Hams, MD    END OF SESSION:   PT End of Session - 05/03/22 0845     Visit Number 3    Number of Visits 9    Date for PT Re-Evaluation 06/14/22    Authorization Type [employer] MCR    Authorization Time Period 04/24/22-06/16/21; 8 visits    Authorization - Visit Number 2    Authorization - Number of Visits 8    PT Start Time 0845    PT Stop Time 2244    PT Time Calculation (min) 38 min             Past Medical History:  Diagnosis Date   Arthritis    Asthma    Bowel obstruction ([place])    Cancer ([place])    Environmental allergies    GERD (gastroesophageal reflux disease)    Glaucoma 05/09/2017   H. pylori infection    History of radiation therapy 07/05/20-08/05/20   Left lung IMRT Dr. Sondra Come    HLD (hyperlipidemia) 01/16/2019   Hypertension    Iron deficiency anemia    Osteopenia 05/26/2017   Thyroid disease    Urticaria 07/25/2018   Past Surgical History:  Procedure Laterality Date   ABDOMINAL HYSTERECTOMY     BOWEL RESECTION N/A 05/23/2020   Procedure: SMALL BOWEL REPAIR;  Surgeon: Mickeal Skinner, MD;  Location: [place];  Service: General;  Laterality: N/A;   BREAST BIOPSY     BRONCHIAL BIOPSY  06/10/2020   Procedure: BRONCHIAL BIOPSIES;  Surgeon: Garner Nash, DO;  Location: [facility];  Service: Pulmonary;;   BRONCHIAL BRUSHINGS  06/10/2020   Procedure: BRONCHIAL BRUSHINGS;  Surgeon: Garner Nash, DO;  Location: [facility];  Service: Pulmonary;;   BRONCHIAL NEEDLE ASPIRATION BIOPSY  06/10/2020   Procedure: BRONCHIAL NEEDLE ASPIRATION BIOPSIES;  Surgeon: Garner Nash, DO;  Location: [facility];  Service: Pulmonary;;   BRONCHIAL WASHINGS  06/10/2020   Procedure: BRONCHIAL WASHINGS;  Surgeon: Garner Nash, DO;  Location: [facility];  Service:  Pulmonary;;   COLON SURGERY     DIAGNOSTIC LARYNGOSCOPY N/A 05/23/2020   Procedure: ATTEMPTED DIAGNOSTIC LAPAROSCOPY WITH ADHESIONS;  Surgeon: Kieth Brightly, Arta Bruce, MD;  Location: [place];  Service: General;  Laterality: N/A;   IR IMAGING GUIDED PORT INSERTION  07/15/2020   KNEE SURGERY     LAPAROTOMY N/A 04/18/2015   Procedure: Exploratory laparotomy with lysis of adhesions, possible bowel resection;  Surgeon: Ralene Ok, MD;  Location: [facility];  Service: General;  Laterality: N/A;   LAPAROTOMY N/A 05/23/2020   Procedure: EXPLORATORY LAPAROTOMY;  Surgeon: Mickeal Skinner, MD;  Location: [facility];  Service: General;  Laterality: N/A;   LYSIS OF ADHESION N/A 05/23/2020   Procedure: LYSIS OF ADHESION;  Surgeon: Kieth Brightly Arta Bruce, MD;  Location: [place] [facility];  Service: General;  Laterality: N/A;   NASAL SINUS SURGERY     THYROID SURGERY     VIDEO BRONCHOSCOPY WITH ENDOBRONCHIAL NAVIGATION Bilateral 06/10/2020   Procedure: VIDEO BRONCHOSCOPY WITH ENDOBRONCHIAL NAVIGATION;  Surgeon: Garner Nash, DO;  Location: [facility];  Service: Pulmonary;  Laterality: Bilateral;   VIDEO BRONCHOSCOPY WITH ENDOBRONCHIAL ULTRASOUND  06/10/2020   Procedure: VIDEO BRONCHOSCOPY WITH ENDOBRONCHIAL ULTRASOUND;  Surgeon: Garner Nash, DO;  Location: [facility];  Service: Pulmonary;;   Patient Active Problem List  Diagnosis Date Noted   Right leg pain 07/13/2021   Memory loss 07/13/2021   RLQ abdominal pain 07/13/2021   Encounter for well adult exam with abnormal findings 06/10/2021   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   Anosmia 05/01/2021   COVID-19 04/25/2021   Debility 02/01/2021   Gait disorder 02/01/2021   Malignant neoplasm of bronchus and lung ([place])    Port-A-Cath in place 11/02/2020   Adenocarcinoma of left lung, stage 3 ([place]) 06/22/2020   Lung mass    Pulmonary nodule 05/17/2020   Iron deficiency 05/03/2020   Swelling of lower extremity 07/30/2019   Ganglion cyst of right foot 01/16/2019    HLD (hyperlipidemia) 01/16/2019   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 pylori infection    Other allergic rhinitis 03/20/2016   Malnutrition of moderate degree 04/11/2015   HTN (hypertension) 04/10/2015    REFERRING DIAG: Chronic bilateral low back pain without sciatica  THERAPY DIAG:  Other low back pain  Pain in left hip  Muscle weakness (generalized)  Rationale for Evaluation and Treatment Rehabilitation  PERTINENT HISTORY:  Hx of lung cancer  PRECAUTIONS: None  SUBJECTIVE:                                                                                                                                                                                      SUBJECTIVE STATEMENT:  I just have a little pain. The tennis ball massage and the exercises are helping.    PAIN:  Are you having pain? Yes:  NPRS scale: 5/10 Pain location: Low back, left hip Pain description: Sharp Aggravating factors: Walking, sitting, lying on left side, straightening up or sitting up tall Relieving factors: Cream   OBJECTIVE: (objective measures completed at initial evaluation unless otherwise dated)   DIAGNOSTIC FINDINGS:  X-ray lumbar 04/11/2022: FINDINGS: There is no evidence of lumbar spine fracture. There is grade 1 anterolisthesis at L4-L5. Multilevel intervertebral disc space narrowing, degenerative endplate changes, and facet arthropathy, most pronounced at L5-S1   PATIENT SURVEYS:  FOTO 19% functional status   SCREENING FOR RED FLAGS: Negative   COGNITION: Overall cognitive status: History of cognitive impairments - at baseline                               SENSATION: WFL   MUSCLE LENGTH: Limited hip flexor and quad  flexibility   POSTURE:  Patient with decreased  lumbar lordosis, slightly forward trunk lean   PALPATION: Tender mildly left lumbar paraspinals, more to left gluteal region   LUMBAR ROM:    AROM eval  Flexion WFL  Extension < 25%  Right lateral flexion 50%  Left lateral flexion 50%  Right rotation 50%  Left rotation 50%            Pain mainly with lumbar extension   LOWER EXTREMITY ROM:                          Hip ROM grossly WFL   LOWER EXTREMITY MMT:     MMT Right eval Left eval  Hip flexion 4 4  Hip extension 3- 3-  Hip abduction 3+ 3  Knee flexion 5 5  Knee extension 5 5    FUNCTIONAL TESTS:  5 times sit to stand: 28 seconds   GAIT: Assistive device utilized: None Level of assistance: Complete Independence Comments: Slight forward trunk lean, antalgic on left     TODAY'S TREATMENT [facility] Adult PT Treatment:                                                DATE: 05/03/22 Therapeutic Exercise: Nustep L3 UE/LE x 5 minutes  Side clam 10 x 2 each Reverse clam 10 x 2  each  LTR with feet apart  Bridge x 10 Left figure 4 push and pull   Manual Therapy:  Soft Foam Roller to left lateral hip and low back   [facility] Adult PT Treatment:                                                DATE: 04/24/22 Therapeutic Exercise: LTR x 10  Side Clam 10 x 2  Piriformis stretch Bridge x 10 SLR 10 x 2  Figure 4 push  Manual Therapy: STW to left low back/lateral hip  Self Care: Instruction in self massage with tennis ball   [facility] Adult PT Treatment:                                                DATE: 04/19/2022 Therapeutic Exercise: Piriformis push/push stretch 2 x 20 sec each LTR 5 x 5 sec SLR x 10 Bridge x 10 Side clamshell x 10   PATIENT EDUCATION:  Education details: Exam findings, POC, HEP Person educated: Patient Education method: Explanation, Demonstration, Tactile cues, Verbal cues, and Handouts Education comprehension: verbalized understanding, returned  demonstration, verbal cues required, tactile cues required, and needs further education   HOME EXERCISE PROGRAM:  Access Code: [email or web address] URL: [email or web address] Date: 04/24/2022 Prepared by: Hessie Diener  Exercises - Supine Piriformis Stretch with Foot on Ground  - 2 x daily - 3 reps - 20 seconds hold - Supine Figure 4 Piriformis Stretch  - 2 x daily - 3 reps - 20 seconds hold - Supine Lower Trunk Rotation  - 2 x daily - 5 reps - 5 seconds hold - Active Straight Leg Raise with Quad Set  - 1 x daily - 2 sets - 10 reps -  Bridge  - 1 x daily - 2 sets - 10 reps - Clamshell  - 1 x daily - 2 sets - 15 reps     ASSESSMENT: CLINICAL IMPRESSION: Patient is a 81 y.o. female who was seen today for physical therapy treatment for chronic left sided low back pain and hip. She reports compliance with HEP and a decrease in pain. Rates pain at 5/10, compared to 9/10 at eval. Time spent with review of HEP today and continued STW to left low back and lateral hip. Reprinted HEP as she misplaced hers but was able to remember most of them. She reports a positive response to using tennis ball for self massage at home.  She was instructed in how to use a tennis ball for self massage and it was issued for home use. She reported soreness in left low back with therex however completed all prescribed reps.    OBJECTIVE IMPAIRMENTS: Abnormal gait, decreased activity tolerance, decreased mobility, decreased ROM, decreased strength, impaired flexibility, postural dysfunction, and pain.    ACTIVITY LIMITATIONS: bending, sitting, standing, squatting, sleeping, stairs, and locomotion level   PARTICIPATION LIMITATIONS: meal prep, cleaning, shopping, and community activity   PERSONAL FACTORS: Fitness, Past/current experiences, Time since onset of injury/illness/exacerbation, and 3+ comorbidities: see PMH above  are also affecting patient's functional outcome.    REHAB POTENTIAL: Good   CLINICAL  DECISION MAKING: Stable/uncomplicated   EVALUATION COMPLEXITY: Low     GOALS: Goals reviewed with patient? Yes   SHORT TERM GOALS: Target date: 05/17/2022   Patient will be I with initial HEP in order to progress with therapy. Baseline: HEP provided at eval Goal status: MET   2.  PT will review FOTO with patient by 3rd visit in order to understand expected progress and outcome with therapy. Baseline: FOTO assessed at eval Status: reviewed at 2nd visit Goal status: MET  3.  Patient will report left lower back and hip pain </= 5/10 with walking and activity in order to reduce functional limitations Baseline: 8-9/10 pain level 05/03/22: 5/10 pain Goal status: ONGOING   LONG TERM GOALS: Target date: 06/14/2022   Patient will be I with final HEP to maintain progress from PT. Baseline: HEP provided at eval Goal status: INITIAL   2.  Patient will report >/= 40% status on FOTO to indicate improved functional ability. Baseline: 19% functional status Goal status: INITIAL   3.  Patient will demonstrate hip strength >/= 4-/5 MMT in order to improve walking tolerance and reduce pain with household tasks Baseline: strength deficits noted above Goal status: INITIAL   4.  Patient will perform 5xSTS </= 15 seconds in order to indicate improved strength and reduced fall risk Baseline: 28 seconds Goal status: INITIAL     PLAN: PT FREQUENCY: 1x/week   PT DURATION: 8 weeks   PLANNED INTERVENTIONS: Therapeutic exercises, Therapeutic activity, Neuromuscular re-education, Balance training, Gait training, Patient/Family education, Self Care, Joint mobilization, Joint manipulation, Aquatic Therapy, Dry Needling, Electrical stimulation, Spinal manipulation, Spinal mobilization, Cryotherapy, Moist heat, Manual therapy, and Re-evaluation.   PLAN FOR NEXT SESSION: Review HEP and progress PRN, manual for left hip and low back, progress hip strength and core stabilization     Hessie Diener,  PTA 05/03/22 9:23 AM Phone: [phone] Fax: [phone]

## 2022-05-03 NOTE — Progress Notes (Signed)
Synopsis: Referred in January 2022 for lung mass by [name], MD  Subjective:   PATIENT ID: Angelica [name] GENDER: female DOB: October 31, 1940, MRN: [id number]  Chief Complaint  Patient presents with   Follow-up    This is an 81 year old female with a past medical history of bowel obstruction, hyperlipidemia, hypertension, thyroid disease, asthma, allergies on Fasenra [facility].  Patient had a recent hospitalization, found to be COVID-positive, both vaccines plus booster.  She had a small bowel obstruction with abdominal pain nausea vomiting, incidental COVID infection.  Seen by general surgery underwent laparoscopy with lysis of adhesions during hospitalization had to be started on TPN.  Ultimately was discharged home.  During this evaluation patient found to have a left-sided lower lobe lung mass with associated mediastinal adenopathy.  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  Patient was referred at the time of discharge from the hospital for evaluation by pulmonary medicine for consideration of tissue diagnosis and biopsy after recovery from COVID-19.  At this time patient has no significant respiratory complaints has been doing well at home.  Patient accompanied today in the office by patient's daughter.  They are in agreements for consideration for tissue biopsy and bronchoscopy.  Patient denies fevers chills, denies hemoptysis.  She has been losing weight she does have poor appetite.  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  She has severe asthma at baseline on Symbicort plus Fasenra.  Respiratory status at this time relatively stable.  Bronchoscopy went well.  She has completed her chemotherapy cycle plus radiation.  Office  note from 08/08/2020, Dr. Earlie Server reviewed today in clinic.  Patient's weight has been stable.  Eating well.   OV 03/16/2021: Doing well at this time.  No significant respiratory changes.  Follows with Dr. Earlie Server.  Had recent CT imaging that revealed improvement in the lower lobe mass.  Not on immunotherapy at this time after admission for infection.  No significant breathing complaints today.  OV 05/03/2022: Here today for follow-up regarding asthma.  Currently on Symbicort plus Fasenra.  Has follow-up imaging already completed with Dr. Earlie Server for medical oncology.  He sees her in a few weeks.  Still using her Flonase for nasal symptoms which is significantly improved.  Otherwise she is trying to stay active.    Past Medical History:  Diagnosis Date   Arthritis    Asthma    Bowel obstruction (HCC)    Cancer (HCC)    Environmental allergies    GERD (gastroesophageal reflux disease)    Glaucoma 05/09/2017   H. pylori infection    History of radiation therapy 07/05/20-08/05/20   Left lung IMRT Dr. Sondra Come    HLD (hyperlipidemia) 01/16/2019   Hypertension    Iron deficiency anemia    Osteopenia 05/26/2017   Thyroid disease    Urticaria 07/25/2018     Family History  Problem Relation Age of Onset   Diabetes Mother    Hypertension Mother    Hypertension Father    Glaucoma Sister    Glaucoma Brother    Allergic rhinitis Neg Hx    Angioedema Neg Hx    Asthma Neg Hx    Eczema Neg Hx    Immunodeficiency Neg Hx    Urticaria Neg Hx  Past Surgical History:  Procedure Laterality Date   ABDOMINAL HYSTERECTOMY     BOWEL RESECTION N/A 05/23/2020   Procedure: SMALL BOWEL REPAIR;  Surgeon: Kinsinger, Arta Bruce, MD;  Location: [facility];  Service: General;  Laterality: N/A;   BREAST BIOPSY     BRONCHIAL BIOPSY  06/10/2020   Procedure: BRONCHIAL BIOPSIES;  Surgeon: Garner Nash, DO;  Location: [facility];  Service: Pulmonary;;   BRONCHIAL BRUSHINGS  06/10/2020   Procedure: BRONCHIAL  BRUSHINGS;  Surgeon: Garner Nash, DO;  Location: [facility];  Service: Pulmonary;;   BRONCHIAL NEEDLE ASPIRATION BIOPSY  06/10/2020   Procedure: BRONCHIAL NEEDLE ASPIRATION BIOPSIES;  Surgeon: Garner Nash, DO;  Location: [facility];  Service: Pulmonary;;   BRONCHIAL WASHINGS  06/10/2020   Procedure: BRONCHIAL WASHINGS;  Surgeon: Garner Nash, DO;  Location: [facility];  Service: Pulmonary;;   COLON SURGERY     DIAGNOSTIC LARYNGOSCOPY N/A 05/23/2020   Procedure: ATTEMPTED DIAGNOSTIC LAPAROSCOPY WITH ADHESIONS;  Surgeon: Kieth Brightly, Arta Bruce, MD;  Location: [facility];  Service: General;  Laterality: N/A;   IR IMAGING GUIDED PORT INSERTION  07/15/2020   KNEE SURGERY     LAPAROTOMY N/A 04/18/2015   Procedure: Exploratory laparotomy with lysis of adhesions, possible bowel resection;  Surgeon: Ralene Ok, MD;  Location: [facility];  Service: General;  Laterality: N/A;   LAPAROTOMY N/A 05/23/2020   Procedure: EXPLORATORY LAPAROTOMY;  Surgeon: Mickeal Skinner, MD;  Location: [facility];  Service: General;  Laterality: N/A;   LYSIS OF ADHESION N/A 05/23/2020   Procedure: LYSIS OF ADHESION;  Surgeon: Kieth Brightly Arta Bruce, MD;  Location: [facility];  Service: General;  Laterality: N/A;   NASAL SINUS SURGERY     THYROID SURGERY     VIDEO BRONCHOSCOPY WITH ENDOBRONCHIAL NAVIGATION Bilateral 06/10/2020   Procedure: VIDEO BRONCHOSCOPY WITH ENDOBRONCHIAL NAVIGATION;  Surgeon: Garner Nash, DO;  Location: [facility];  Service: Pulmonary;  Laterality: Bilateral;   VIDEO BRONCHOSCOPY WITH ENDOBRONCHIAL ULTRASOUND  06/10/2020   Procedure: VIDEO BRONCHOSCOPY WITH ENDOBRONCHIAL ULTRASOUND;  Surgeon: Garner Nash, DO;  Location: [facility];  Service: Pulmonary;;    Social History   Socioeconomic History   Marital status: Widowed    Spouse name: Not on file   Number of children: 4   Years of education: 19   Highest education level: Not on file  Occupational History   Occupation: retired   Tobacco Use   Smoking status: Former    Types: Cigarettes   Smokeless tobacco: Never   Tobacco comments:    quit 60 years ago  Vaping Use   Vaping Use: Never used  Substance and Sexual Activity   Alcohol use: No   Drug use: No   Sexual activity: Never  Other Topics Concern   Not on file  Social History Narrative   Right handed   Caffeine on occ   One story home   Social Determinants of Health   Financial Resource Strain: Low Risk  (04/25/2021)   Overall Financial Resource Strain (CARDIA)    Difficulty of Paying Living Expenses: Not hard at all  Food Insecurity: No Food Insecurity (11/16/2021)   Hunger Vital Sign    Worried About Running Out of Food in the Last Year: Never true    [place] in the Last Year: Never true  Transportation Needs: No Transportation Needs (11/16/2021)   PRAPARE - Hydrologist (Medical): No    Lack of Transportation (Non-Medical): No  Physical Activity: Unknown (04/25/2021)   Exercise Vital Sign    Days of Exercise per Week: 0 days    Minutes of Exercise per Session: Not on file  Recent Concern: Physical Activity - Inactive (04/25/2021)   Exercise Vital Sign    Days of Exercise per Week: 0 days    Minutes of Exercise per Session: 30 min  Stress: No Stress Concern Present (04/25/2021)   [place]    Feeling of Stress : Not at all  Social Connections: Moderately Isolated (04/25/2021)   Social Connection and Isolation Panel [NHANES]    Frequency of Communication with Friends and Family: More than three times a week    Frequency of Social Gatherings with Friends and Family: Twice a week    Attends Religious Services: More than 4 times per year    Active Member of [employer] or Organizations: No    Attends Archivist Meetings: Not on file    Marital Status: Widowed  Intimate Partner Violence: Not At Risk (02/27/2021)   Humiliation, Afraid, Rape,  and Kick questionnaire    Fear of Current or Ex-Partner: No    Emotionally Abused: No    Physically Abused: No    Sexually Abused: No     Allergies  Allergen Reactions   Asa Buff (Mag [Buffered Aspirin] Other (See Comments)    Excessive sweating   Ensure [Nutritional Supplements]     Or boost - upset stomach    Fish Allergy Other (See Comments)    Unknown reaction, but allergic   Other Other (See Comments)    Gelcaps; gel-containing capsules; extended-release medication - upset stomach    Peanut-Containing Drug Products Other (See Comments)    From allergy test   Penicillins Itching    Has patient had a PCN reaction causing immediate rash, facial/tongue/throat swelling, SOB or lightheadedness with hypotension: No Has patient had a PCN reaction causing severe rash involving mucus membranes or skin necrosis: No Has patient had a PCN reaction that required hospitalization No Has patient had a PCN reaction occurring within the last 10 years: No If all of the above answers are "NO", then may proceed with Cephalosporin use.   Shellfish-Derived Products Other (See Comments)    From allergy test   Strawberry Extract Other (See Comments)    From allergy test      Outpatient Medications Prior to Visit  Medication Sig Dispense Refill   cetirizine (ZYRTEC ALLERGY) 10 MG tablet Take 1 tablet (10 mg total) by mouth daily. 90 tablet 1   fluticasone (FLONASE) 50 MCG/ACT nasal spray Place 1 spray into both nostrils in the morning and at bedtime. 16 g 5   ipratropium (ATROVENT) 0.06 % nasal spray Place 2 sprays into both nostrils 4 (four) times daily as needed (nasal drainage). 15 mL 5   SYMBICORT 80-4.5 MCG/ACT inhaler INHALE 2 PUFFS BY MOUTH IN THE MORNING AND AT BEDTIME 10.2 g 5   acetaminophen (TYLENOL) 500 MG tablet Take 500 mg by mouth every 6 (six) hours as needed for moderate pain.     alum & mag hydroxide-simeth (MAALOX/MYLANTA) 200-200-20 MG/5ML suspension Take 15 mLs by mouth every 6  (six) hours as needed for indigestion or heartburn.     amLODipine (NORVASC) 10 MG tablet Take 1 tablet (10 mg total) by mouth daily. 90 tablet 3   Benralizumab (FASENRA) 30 MG/ML SOSY Inject 1 mL (30 mg total) into the skin every 8 (eight) weeks. 1 mL  6   clopidogrel (PLAVIX) 75 MG tablet Take 1 tablet (75 mg total) by mouth daily. 90 tablet 3   diclofenac Sodium (VOLTAREN) 1 % GEL Apply 4 g topically 4 (four) times daily. 100 g 0   EPINEPHrine (EPIPEN 2-PAK) 0.3 mg/0.3 mL IJ SOAJ injection Use as directed for severe allergic reaction 2 each 1   levothyroxine (SYNTHROID) 50 MCG tablet Take 1 tablet (50 mcg total) by mouth daily before breakfast. 90 tablet 3   linaclotide (LINZESS) 72 MCG capsule TAKE 1 CAPSULE BY MOUTH ONCE DAILY AS NEEDED 30 capsule 0   Magnesium Hydroxide (PHILLIPS MILK OF MAGNESIA PO) Take 15-30 mLs by mouth daily as needed (for constipation).     meclizine (ANTIVERT) 25 MG tablet Take 1 tablet (25 mg total) by mouth 2 (two) times daily as needed for dizziness. 10 tablet 0   memantine (NAMENDA) 10 MG tablet Take 1 tablet (10 mg total) by mouth 2 (two) times daily. 60 tablet 5   ondansetron (ZOFRAN) 8 MG tablet Take 1 tablet (8 mg total) by mouth every 8 (eight) hours as needed for nausea or vomiting. 20 tablet 0   polyethylene glycol (MIRALAX / GLYCOLAX) 17 g packet Take 17 g by mouth daily as needed for mild constipation.     pravastatin (PRAVACHOL) 80 MG tablet Take 1 tablet by mouth once daily 90 tablet 3   traMADol (ULTRAM) 50 MG tablet Take 50 mg by mouth every 6 (six) hours as needed for moderate pain.     Facility-Administered Medications Prior to Visit  Medication Dose Route Frequency Provider Last Rate Last Admin   Benralizumab SOSY 30 mg  30 mg Subcutaneous Q8 Weeks Kennith Gain, MD   30 mg at 04/30/22 0945    Review of Systems  Constitutional:  Negative for chills, fever, malaise/fatigue and weight loss.  HENT:  Negative for hearing loss, sore  throat and tinnitus.   Eyes:  Negative for blurred vision and double vision.  Respiratory:  Positive for shortness of breath. Negative for cough, hemoptysis, sputum production, wheezing and stridor.   Cardiovascular:  Negative for chest pain, palpitations, orthopnea, leg swelling and PND.  Gastrointestinal:  Negative for abdominal pain, constipation, diarrhea, heartburn, nausea and vomiting.  Genitourinary:  Negative for dysuria, hematuria and urgency.  Musculoskeletal:  Negative for joint pain and myalgias.  Skin:  Negative for itching and rash.  Neurological:  Negative for dizziness, tingling, weakness and headaches.  Endo/Heme/Allergies:  Negative for environmental allergies. Does not bruise/bleed easily.  Psychiatric/Behavioral:  Negative for depression. The patient is not nervous/anxious and does not have insomnia.   All other systems reviewed and are negative.    Objective:  Physical Exam Vitals reviewed.  Constitutional:      General: She is not in acute distress.    Appearance: She is well-developed.     Comments: Thin, some weight loss  HENT:     Head: Normocephalic and atraumatic.  Eyes:     General: No scleral icterus.    Conjunctiva/sclera: Conjunctivae normal.     Pupils: Pupils are equal, round, and reactive to light.  Neck:     Vascular: No JVD.     Trachea: No tracheal deviation.  Cardiovascular:     Rate and Rhythm: Normal rate and regular rhythm.     Heart sounds: Normal heart sounds. No murmur heard. Pulmonary:     Effort: Pulmonary effort is normal. No tachypnea, accessory muscle usage or respiratory distress.     Breath  sounds: Normal breath sounds. No stridor. No wheezing, rhonchi or rales.  Abdominal:     General: Bowel sounds are normal. There is no distension.     Palpations: Abdomen is soft.     Tenderness: There is no abdominal tenderness.  Musculoskeletal:        General: No tenderness.     Cervical back: Neck supple.  Lymphadenopathy:      Cervical: No cervical adenopathy.  Skin:    General: Skin is warm and dry.     Capillary Refill: Capillary refill takes less than 2 seconds.     Findings: No rash.  Neurological:     Mental Status: She is alert and oriented to person, place, and time.  Psychiatric:        Behavior: Behavior normal.      Vitals:   05/03/22 1551  BP: 138/78  Pulse: 79  SpO2: 99%  Weight: 143 lb (64.9 kg)  Height: 5\' 5"  (1.651 m)   99% on RA BMI Readings from Last 3 Encounters:  05/03/22 23.80 kg/m  04/17/22 24.21 kg/m  04/11/22 23.80 kg/m   Wt Readings from Last 3 Encounters:  05/03/22 143 lb (64.9 kg)  04/17/22 145 lb 8 oz (66 kg)  04/11/22 143 lb (64.9 kg)     CBC    Component Value Date/Time   WBC 5.7 11/10/2021 0845   WBC 5.8 10/19/2021 1456   RBC 4.54 11/10/2021 0845   HGB 13.9 11/10/2021 0845   HGB 13.1 04/09/2017 1721   HCT 40.4 11/10/2021 0845   HCT 39.8 04/09/2017 1721   PLT 246 11/10/2021 0845   PLT 355 04/09/2017 1721   MCV 89.0 11/10/2021 0845   MCV 91 04/09/2017 1721   MCH 30.6 11/10/2021 0845   MCHC 34.4 11/10/2021 0845   RDW 14.0 11/10/2021 0845   RDW 14.0 04/09/2017 1721   LYMPHSABS 1.4 11/10/2021 0845   LYMPHSABS 3.6 (H) 04/09/2017 1721   MONOABS 0.5 11/10/2021 0845   EOSABS 0.0 11/10/2021 0845   EOSABS 0.7 (H) 04/09/2017 1721   BASOSABS 0.0 11/10/2021 0845   BASOSABS 0.0 04/09/2017 1721    Chest Imaging: CT chest 2020-05-17: Patient found to have left lower lobe 3 cm lung mass with associated mediastinal adenopathy concerning for primary bronchogenic carcinoma and advanced age. Images reviewed today in the office.The patient's images have been independently reviewed by me.    02/28/2021 CT chest: Dramatic improvement of the left lower lobe lesion, radiation fibrosis. No significant hilar adenopathy. The patient's images have been independently reviewed by me.  .  CT chest 04/10/2022: Signs of bronchiectasis pleural and parenchymal scarring in  the left hilum related to previous treatments.  Large nodule along the pleural surface in the right chest and right upper lobe along the minor fissure indeterminate given morphology.  May consider PET scan.  Felt for need for short-term image follow-up. The patient's images have been independently reviewed by me.     Pulmonary Functions Testing Results:     No data to display          FeNO:   Pathology:   06/10/2020:  FINAL MICROSCOPIC DIAGNOSIS:  D. LYMPH NODE, 7 NODE, FINE NEEDLE ASPIRATION:  - Malignant cells consistent with non-small cell carcinoma. The patient's path have been reviewed by me.    Echocardiogram:   Heart Catheterization:    Assessment & Plan:     ICD-10-CM   1. Adenocarcinoma of left lung, stage 3 (HCC)  C34.92  2. History of radiation therapy  Z92.3     3. Asthma, severe persistent, well-controlled  J45.50     4. Lung mass  R91.8       Discussion:  This is an 81 year old female, left upper lobe mass mediastinal adenopathy status post bronchoscopy diagnosed with stage IIIb adenocarcinoma of the lung status post chemo and radiation.  She has severe asthma baseline on Symbicort plus Fasenra plus as needed albuterol.  Recent CT scan of the chest shows question of a lesion in the right lung.  Plan: I will reach out to medical oncology to see if there is interested in having a PET scan complete. Determine what we need to do regarding next steps regarding the right pleural-based nodule.  She also has other small nodules within the bilateral lungs.  Continue her Fasenra plus Symbicort and albuterol.  She follows with asthma and allergy.  Pending on the feedback from medical oncology will determine next steps and whether or not we need to do anything from a procedural standpoint.  Otherwise from a respiratory standpoint she can follow-up with me in 6 months or as needed.    Current Outpatient Medications:    cetirizine (ZYRTEC ALLERGY) 10 MG  tablet, Take 1 tablet (10 mg total) by mouth daily., Disp: 90 tablet, Rfl: 1   fluticasone (FLONASE) 50 MCG/ACT nasal spray, Place 1 spray into both nostrils in the morning and at bedtime., Disp: 16 g, Rfl: 5   ipratropium (ATROVENT) 0.06 % nasal spray, Place 2 sprays into both nostrils 4 (four) times daily as needed (nasal drainage)., Disp: 15 mL, Rfl: 5   SYMBICORT 80-4.5 MCG/ACT inhaler, INHALE 2 PUFFS BY MOUTH IN THE MORNING AND AT BEDTIME, Disp: 10.2 g, Rfl: 5   acetaminophen (TYLENOL) 500 MG tablet, Take 500 mg by mouth every 6 (six) hours as needed for moderate pain., Disp: , Rfl:    alum & mag hydroxide-simeth (MAALOX/MYLANTA) 200-200-20 MG/5ML suspension, Take 15 mLs by mouth every 6 (six) hours as needed for indigestion or heartburn., Disp: , Rfl:    amLODipine (NORVASC) 10 MG tablet, Take 1 tablet (10 mg total) by mouth daily., Disp: 90 tablet, Rfl: 3   Benralizumab (FASENRA) 30 MG/ML SOSY, Inject 1 mL (30 mg total) into the skin every 8 (eight) weeks., Disp: 1 mL, Rfl: 6   clopidogrel (PLAVIX) 75 MG tablet, Take 1 tablet (75 mg total) by mouth daily., Disp: 90 tablet, Rfl: 3   diclofenac Sodium (VOLTAREN) 1 % GEL, Apply 4 g topically 4 (four) times daily., Disp: 100 g, Rfl: 0   EPINEPHrine (EPIPEN 2-PAK) 0.3 mg/0.3 mL IJ SOAJ injection, Use as directed for severe allergic reaction, Disp: 2 each, Rfl: 1   levothyroxine (SYNTHROID) 50 MCG tablet, Take 1 tablet (50 mcg total) by mouth daily before breakfast., Disp: 90 tablet, Rfl: 3   linaclotide (LINZESS) 72 MCG capsule, TAKE 1 CAPSULE BY MOUTH ONCE DAILY AS NEEDED, Disp: 30 capsule, Rfl: 0   Magnesium Hydroxide (PHILLIPS MILK OF MAGNESIA PO), Take 15-30 mLs by mouth daily as needed (for constipation)., Disp: , Rfl:    meclizine (ANTIVERT) 25 MG tablet, Take 1 tablet (25 mg total) by mouth 2 (two) times daily as needed for dizziness., Disp: 10 tablet, Rfl: 0   memantine (NAMENDA) 10 MG tablet, Take 1 tablet (10 mg total) by mouth 2 (two)  times daily., Disp: 60 tablet, Rfl: 5   ondansetron (ZOFRAN) 8 MG tablet, Take 1 tablet (8 mg total) by mouth every  8 (eight) hours as needed for nausea or vomiting., Disp: 20 tablet, Rfl: 0   polyethylene glycol (MIRALAX / GLYCOLAX) 17 g packet, Take 17 g by mouth daily as needed for mild constipation., Disp: , Rfl:    pravastatin (PRAVACHOL) 80 MG tablet, Take 1 tablet by mouth once daily, Disp: 90 tablet, Rfl: 3   traMADol (ULTRAM) 50 MG tablet, Take 50 mg by mouth every 6 (six) hours as needed for moderate pain., Disp: , Rfl:   Current Facility-Administered Medications:    Benralizumab SOSY 30 mg, 30 mg, Subcutaneous, Q8 Weeks, Kennith Gain, MD, 30 mg at 04/30/22 [place], DO [facility] 05/03/2022 4:15 PM

## 2022-05-09 ENCOUNTER — Ambulatory Visit
Admission: RE | Admit: 2022-05-09 | Discharge: 2022-05-09 | Disposition: A | Discharge status: Home / Self Care | Payer: Medicare HMO | Source: Ambulatory Visit | Attending: Internal Medicine | Admitting: Internal Medicine

## 2022-05-09 DIAGNOSIS — Z1231 Encounter for screening mammogram for malignant neoplasm of breast: Secondary | ICD-10-CM | POA: Diagnosis not present

## 2022-05-09 DIAGNOSIS — Z139 Encounter for screening, unspecified: Secondary | ICD-10-CM

## 2022-05-10 ENCOUNTER — Ambulatory Visit: Payer: Medicare HMO | Admitting: Physical Therapy

## 2022-05-10 DIAGNOSIS — M6281 Muscle weakness (generalized): Secondary | ICD-10-CM

## 2022-05-10 DIAGNOSIS — M5459 Other low back pain: Secondary | ICD-10-CM

## 2022-05-10 DIAGNOSIS — M25552 Pain in left hip: Secondary | ICD-10-CM | POA: Diagnosis not present

## 2022-05-10 NOTE — Therapy (Addendum)
OUTPATIENT PHYSICAL THERAPY TREATMENT NOTE  DISCHARGE   Patient Name: Angelica [name] MRN: [id number] DOB:1940/12/03, 81 y.o., female Today's Date: 05/10/2022  PCP: [name], MD   REFERRING PROVIDER: Gregor Hams, MD    END OF SESSION:   PT End of Session - 05/10/22 0841     Visit Number 4    Number of Visits 9    Date for PT Re-Evaluation 06/14/22    Authorization Type [employer] MCR    Authorization Time Period 04/24/22-06/16/21; 8 visits    Authorization - Visit Number 3    Authorization - Number of Visits 8    PT Start Time 0845    PT Stop Time 8757    PT Time Calculation (min) 43 min             Past Medical History:  Diagnosis Date   Arthritis    Asthma    Bowel obstruction ([place])    Cancer ([place])    Environmental allergies    GERD (gastroesophageal reflux disease)    Glaucoma 05/09/2017   H. pylori infection    History of radiation therapy 07/05/20-08/05/20   Left lung IMRT Dr. Sondra Come    HLD (hyperlipidemia) 01/16/2019   Hypertension    Iron deficiency anemia    Osteopenia 05/26/2017   Thyroid disease    Urticaria 07/25/2018   Past Surgical History:  Procedure Laterality Date   ABDOMINAL HYSTERECTOMY     BOWEL RESECTION N/A 05/23/2020   Procedure: SMALL BOWEL REPAIR;  Surgeon: Mickeal Skinner, MD;  Location: [facility];  Service: General;  Laterality: N/A;   BREAST BIOPSY     BRONCHIAL BIOPSY  06/10/2020   Procedure: BRONCHIAL BIOPSIES;  Surgeon: Garner Nash, DO;  Location: [facility];  Service: Pulmonary;;   BRONCHIAL BRUSHINGS  06/10/2020   Procedure: BRONCHIAL BRUSHINGS;  Surgeon: Garner Nash, DO;  Location: [facility];  Service: Pulmonary;;   BRONCHIAL NEEDLE ASPIRATION BIOPSY  06/10/2020   Procedure: BRONCHIAL NEEDLE ASPIRATION BIOPSIES;  Surgeon: Garner Nash, DO;  Location: [facility];  Service: Pulmonary;;   BRONCHIAL WASHINGS  06/10/2020   Procedure: BRONCHIAL WASHINGS;  Surgeon: Garner Nash, DO;  Location: [facility];   Service: Pulmonary;;   COLON SURGERY     DIAGNOSTIC LARYNGOSCOPY N/A 05/23/2020   Procedure: ATTEMPTED DIAGNOSTIC LAPAROSCOPY WITH ADHESIONS;  Surgeon: Kieth Brightly, Arta Bruce, MD;  Location: [facility];  Service: General;  Laterality: N/A;   IR IMAGING GUIDED PORT INSERTION  07/15/2020   KNEE SURGERY     LAPAROTOMY N/A 04/18/2015   Procedure: Exploratory laparotomy with lysis of adhesions, possible bowel resection;  Surgeon: Ralene Ok, MD;  Location: [facility];  Service: General;  Laterality: N/A;   LAPAROTOMY N/A 05/23/2020   Procedure: EXPLORATORY LAPAROTOMY;  Surgeon: Mickeal Skinner, MD;  Location: [facility];  Service: General;  Laterality: N/A;   LYSIS OF ADHESION N/A 05/23/2020   Procedure: LYSIS OF ADHESION;  Surgeon: Kieth Brightly Arta Bruce, MD;  Location: [facility];  Service: General;  Laterality: N/A;   NASAL SINUS SURGERY     THYROID SURGERY     VIDEO BRONCHOSCOPY WITH ENDOBRONCHIAL NAVIGATION Bilateral 06/10/2020   Procedure: VIDEO BRONCHOSCOPY WITH ENDOBRONCHIAL NAVIGATION;  Surgeon: Garner Nash, DO;  Location: [facility];  Service: Pulmonary;  Laterality: Bilateral;   VIDEO BRONCHOSCOPY WITH ENDOBRONCHIAL ULTRASOUND  06/10/2020   Procedure: VIDEO BRONCHOSCOPY WITH ENDOBRONCHIAL ULTRASOUND;  Surgeon: Garner Nash, DO;  Location: 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   Anosmia 05/01/2021   COVID-19 04/25/2021   Debility 02/01/2021   Gait disorder 02/01/2021   Malignant neoplasm of bronchus and lung ([place])    Port-A-Cath in place 11/02/2020   Adenocarcinoma of left lung, stage 3 ([place]) 06/22/2020   Lung mass    Pulmonary nodule 05/17/2020   Iron deficiency 05/03/2020   Swelling of lower extremity 07/30/2019   Ganglion cyst of right foot  01/16/2019   HLD (hyperlipidemia) 01/16/2019   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 pylori infection    Other allergic rhinitis 03/20/2016   Malnutrition of moderate degree 04/11/2015   HTN (hypertension) 04/10/2015    REFERRING DIAG: Chronic bilateral low back pain without sciatica  THERAPY DIAG:  Other low back pain  Pain in left hip  Muscle weakness (generalized)  Rationale for Evaluation and Treatment Rehabilitation  PERTINENT HISTORY:  Hx of lung cancer  PRECAUTIONS: None  SUBJECTIVE:                                                                                                                                                                                      SUBJECTIVE STATEMENT:  The pain is gone in the low back and hip.  Just a small spot of soreness in left low back.    PAIN:  Are you having pain? Yes:  NPRS scale: 0/10 Pain location: Low back, left hip Pain description: Sharp Aggravating factors: Walking, sitting, lying on left side, straightening up or sitting up tall Relieving factors: Cream   OBJECTIVE: (objective measures completed at initial evaluation unless otherwise dated)   DIAGNOSTIC FINDINGS:  X-ray lumbar 04/11/2022: FINDINGS: There is no evidence of lumbar spine fracture. There is grade 1 anterolisthesis at L4-L5. Multilevel intervertebral disc space narrowing, degenerative endplate changes, and facet arthropathy, most pronounced at L5-S1   PATIENT SURVEYS:  FOTO 19% functional status   SCREENING FOR RED FLAGS: Negative   COGNITION: Overall cognitive status: History of cognitive impairments - at baseline                               SENSATION: WFL   MUSCLE LENGTH: Limited hip  flexor  and quad flexibility   POSTURE:  Patient with decreased lumbar lordosis, slightly forward trunk lean   PALPATION: Tender mildly left lumbar paraspinals, more to left gluteal region   LUMBAR ROM:    AROM eval  Flexion WFL  Extension < 25%  Right lateral flexion 50%  Left lateral flexion 50%  Right rotation 50%  Left rotation 50%            Pain mainly with lumbar extension   LOWER EXTREMITY ROM:                          Hip ROM grossly WFL   LOWER EXTREMITY MMT:     MMT Right eval Left eval Right 05/10/22 Left 05/10/22  Hip flexion 4 4 4+ 4+  Hip extension 3- 3- 3- 3-  Hip abduction 3+ _0 Knee flexion 5 5    Knee extension 5 5      FUNCTIONAL TESTS:  5 times sit to stand: 28 seconds 05/10/22 5 x STS : 26 sec without UE   GAIT: Assistive device utilized: None Level of assistance: Complete Independence Comments: Slight forward trunk lean, antalgic on left     TODAY'S TREATMENT [facility] Adult PT Treatment:                                                DATE: 05/10/22 Therapeutic Exercise: MMT Bridge 10 sec x 10 LTR Hip flexor stretch EOM with intermittent active knee flexion Nustep L4 LE only Standing hip ext x 10 each Standing alternating H/S curls for quad stretch 5 x STS 26 sec without UE , mat , repeated x 1  Updated HEP    [facility] Adult PT Treatment:                                                DATE: 05/03/22 Therapeutic Exercise: Nustep L3 UE/LE x 5 minutes  Side clam 10 x 2 each Reverse clam 10 x 2  each  LTR with feet apart  Bridge x 10 Left figure 4 push and pull   Manual Therapy:  Soft Foam Roller to left lateral hip and low back   [facility] Adult PT Treatment:                                                DATE: 04/24/22 Therapeutic Exercise: LTR x 10  Side Clam 10 x 2  Piriformis stretch Bridge x 10 SLR 10 x 2  Figure 4 push  Manual Therapy: STW to left low back/lateral hip  Self Care: Instruction in self massage with  tennis ball   [facility] Adult PT Treatment:                                                DATE: 04/19/2022 Therapeutic Exercise: Piriformis push/push stretch 2 x 20 sec each LTR 5 x 5 sec SLR x 10 Bridge  x 10 Side clamshell x 10   PATIENT EDUCATION:  Education details: Exam findings, POC, HEP Person educated: Patient Education method: Explanation, Demonstration, Tactile cues, Verbal cues, and Handouts Education comprehension: verbalized understanding, returned demonstration, verbal cues required, tactile cues required, and needs further education   HOME EXERCISE PROGRAM:  Access Code: [email or web address] URL: [email or web address] Date: 04/24/2022 Prepared by: Hessie Diener  Exercises - Supine Piriformis Stretch with Foot on Ground  - 2 x daily - 3 reps - 20 seconds hold - Supine Figure 4 Piriformis Stretch  - 2 x daily - 3 reps - 20 seconds hold - Supine Lower Trunk Rotation  - 2 x daily - 5 reps - 5 seconds hold - Active Straight Leg Raise with Quad Set  - 1 x daily - 2 sets - 10 reps - Bridge  - 1 x daily - 2 sets - 10 reps - Clamshell  - 1 x daily - 2 sets - 15 reps Added - Sit to stand with control  - 1 x daily - 7 x weekly - 2 sets - 5 reps - Standing Alternating Knee Flexion  - 1 x daily - 7 x weekly - 2 sets - 10 reps - 5 hold     ASSESSMENT: CLINICAL IMPRESSION: Patient is a 81 y.o. female who was seen today for physical therapy treatment for chronic left sided low back pain and hip. She reports compliance with HEP although she has lost per paperwork. Reprinted HEP. Assessment of MMT hips reveals improvement in hip flexion and abduction, although still has 3-5 hip extension. In prone patient demonstrates decreased knee flexion with increased pain in Left low back with left knee flexion. Increased Bridge hold time and added STS and hamstring curls to HEP. She felt some LLB soreness intermittently with therex today.     OBJECTIVE IMPAIRMENTS: Abnormal gait, decreased  activity tolerance, decreased mobility, decreased ROM, decreased strength, impaired flexibility, postural dysfunction, and pain.    ACTIVITY LIMITATIONS: bending, sitting, standing, squatting, sleeping, stairs, and locomotion level   PARTICIPATION LIMITATIONS: meal prep, cleaning, shopping, and community activity   PERSONAL FACTORS: Fitness, Past/current experiences, Time since onset of injury/illness/exacerbation, and 3+ comorbidities: see PMH above  are also affecting patient's functional outcome.    REHAB POTENTIAL: Good   CLINICAL DECISION MAKING: Stable/uncomplicated   EVALUATION COMPLEXITY: Low     GOALS: Goals reviewed with patient? Yes   SHORT TERM GOALS: Target date: 05/17/2022   Patient will be I with initial HEP in order to progress with therapy. Baseline: HEP provided at eval Goal status: MET   2.  PT will review FOTO with patient by 3rd visit in order to understand expected progress and outcome with therapy. Baseline: FOTO assessed at eval Status: reviewed at 2nd visit Goal status: MET  3.  Patient will report left lower back and hip pain </= 5/10 with walking and activity in order to reduce functional limitations Baseline: 8-9/10 pain level 05/03/22: 5/10 pain 05/10/22: 0/10; min soreness in Left low back  Goal status: MET   LONG TERM GOALS: Target date: 06/14/2022   Patient will be I with final HEP to maintain progress from PT. Baseline: HEP provided at eval Goal status: INITIAL   2.  Patient will report >/= 40% status on FOTO to indicate improved functional ability. Baseline: 19% functional status Goal status: INITIAL   3.  Patient will demonstrate hip strength >/= 4-/5 MMT in order to improve walking tolerance and reduce pain with household tasks Baseline: strength  deficits noted above 05/10/22: 3-/5 hip extension bilateral Goal status: ONGOING   4.  Patient will perform 5xSTS </= 15 seconds in order to indicate improved strength and reduced fall  risk Baseline: 28 seconds 05/10/22: 26 sec , no UE Goal status: INITIAL     PLAN: PT FREQUENCY: 1x/week   PT DURATION: 8 weeks   PLANNED INTERVENTIONS: Therapeutic exercises, Therapeutic activity, Neuromuscular re-education, Balance training, Gait training, Patient/Family education, Self Care, Joint mobilization, Joint manipulation, Aquatic Therapy, Dry Needling, Electrical stimulation, Spinal manipulation, Spinal mobilization, Cryotherapy, Moist heat, Manual therapy, and Re-evaluation.   PLAN FOR NEXT SESSION: Review HEP and progress PRN, manual for left hip and low back, progress hip strength and core stabilization; assess response to hip flexor stretches     Hessie Diener, PTA 05/10/22 9:30 AM Phone: [phone] Fax: [phone]       PHYSICAL THERAPY DISCHARGE SUMMARY  Visits from Start of Care: 4  Current functional level related to goals / functional outcomes: See above   Remaining deficits: See above   Education / Equipment: HEP   Patient agrees to discharge. Patient goals were partially met. Patient is being discharged due to being pleased with the current functional level.  Hilda Blades, PT, DPT, LAT, ATC 05/23/22  10:13 AM Phone: [phone] Fax: [phone]

## 2022-05-15 ENCOUNTER — Other Ambulatory Visit: Payer: Medicare HMO

## 2022-05-16 NOTE — Therapy (Incomplete)
OUTPATIENT PHYSICAL THERAPY TREATMENT NOTE   Patient Name: Angelica [name] MRN: [id number] DOB:[birth year], 81 y.o., female Today's Date: 05/16/2022  PCP: [name], MD   REFERRING PROVIDER: Gregor Hams, MD    END OF SESSION:     Past Medical History:  Diagnosis Date   Arthritis    Asthma    Bowel obstruction ([place])    Cancer ([place])    Environmental allergies    GERD (gastroesophageal reflux disease)    Glaucoma 05/09/2017   H. pylori infection    History of radiation therapy 07/05/20-08/05/20   Left lung IMRT Dr. Sondra Come    HLD (hyperlipidemia) 01/16/2019   Hypertension    Iron deficiency anemia    Osteopenia 05/26/2017   Thyroid disease    Urticaria 07/25/2018   Past Surgical History:  Procedure Laterality Date   ABDOMINAL HYSTERECTOMY     BOWEL RESECTION N/A 05/23/2020   Procedure: SMALL BOWEL REPAIR;  Surgeon: Mickeal Skinner, MD;  Location: [facility];  Service: General;  Laterality: N/A;   BREAST BIOPSY     BRONCHIAL BIOPSY  06/10/2020   Procedure: BRONCHIAL BIOPSIES;  Surgeon: Garner Nash, DO;  Location: [facility];  Service: Pulmonary;;   BRONCHIAL BRUSHINGS  06/10/2020   Procedure: BRONCHIAL BRUSHINGS;  Surgeon: Garner Nash, DO;  Location: [facility];  Service: Pulmonary;;   BRONCHIAL NEEDLE ASPIRATION BIOPSY  06/10/2020   Procedure: BRONCHIAL NEEDLE ASPIRATION BIOPSIES;  Surgeon: Garner Nash, DO;  Location: [facility];  Service: Pulmonary;;   BRONCHIAL WASHINGS  06/10/2020   Procedure: BRONCHIAL WASHINGS;  Surgeon: Garner Nash, DO;  Location: [facility];  Service: Pulmonary;;   COLON SURGERY     DIAGNOSTIC LARYNGOSCOPY N/A 05/23/2020   Procedure: ATTEMPTED DIAGNOSTIC LAPAROSCOPY WITH ADHESIONS;  Surgeon: Kieth Brightly, Arta Bruce, MD;  Location: [facility];  Service: General;  Laterality: N/A;   IR IMAGING GUIDED PORT INSERTION  07/15/2020   KNEE SURGERY     LAPAROTOMY N/A 04/18/2015   Procedure: Exploratory laparotomy with lysis of adhesions,  possible bowel resection;  Surgeon: Ralene Ok, MD;  Location: [facility];  Service: General;  Laterality: N/A;   LAPAROTOMY N/A 05/23/2020   Procedure: EXPLORATORY LAPAROTOMY;  Surgeon: Mickeal Skinner, MD;  Location: [facility];  Service: General;  Laterality: N/A;   LYSIS OF ADHESION N/A 05/23/2020   Procedure: LYSIS OF ADHESION;  Surgeon: Kieth Brightly Arta Bruce, MD;  Location: [place] [facility];  Service: General;  Laterality: N/A;   NASAL SINUS SURGERY     THYROID SURGERY     VIDEO BRONCHOSCOPY WITH ENDOBRONCHIAL NAVIGATION Bilateral 06/10/2020   Procedure: VIDEO BRONCHOSCOPY WITH ENDOBRONCHIAL NAVIGATION;  Surgeon: Garner Nash, DO;  Location: [facility];  Service: Pulmonary;  Laterality: Bilateral;   VIDEO BRONCHOSCOPY WITH ENDOBRONCHIAL ULTRASOUND  06/10/2020   Procedure: VIDEO BRONCHOSCOPY WITH ENDOBRONCHIAL ULTRASOUND;  Surgeon: Garner Nash, DO;  Location: [facility];  Service: Pulmonary;;   Patient Active Problem List   Diagnosis Date Noted   Right leg pain 07/13/2021   Memory loss 07/13/2021   RLQ abdominal pain 07/13/2021   Encounter for well adult exam with abnormal findings 06/10/2021   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   Anosmia 05/01/2021   COVID-19 04/25/2021   Debility 02/01/2021   Gait disorder 02/01/2021   Malignant neoplasm of bronchus and lung ([place])    Port-A-Cath in place 11/02/2020   Adenocarcinoma of left lung, stage 3 ([place]) 06/22/2020   Lung mass    Pulmonary nodule 05/17/2020   Iron deficiency  05/03/2020   Swelling of lower extremity 07/30/2019   Ganglion cyst of right foot 01/16/2019   HLD (hyperlipidemia) 01/16/2019   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 pylori infection    Other allergic rhinitis  03/20/2016   Malnutrition of moderate degree 04/11/2015   HTN (hypertension) 04/10/2015    REFERRING DIAG: Chronic bilateral low back pain without sciatica  THERAPY DIAG:  No diagnosis found.  Rationale for Evaluation and Treatment Rehabilitation  PERTINENT HISTORY:  Hx of lung cancer  PRECAUTIONS: None   SUBJECTIVE:           SUBJECTIVE STATEMENT:  The pain is gone in the low back and hip.  Just a small spot of soreness in left low back.   PAIN:  Are you having pain? Yes:  NPRS scale: 0/10 Pain location: Low back, left hip Pain description: Sharp Aggravating factors: Walking, sitting, lying on left side, straightening up or sitting up tall Relieving factors: Cream   OBJECTIVE: (objective measures completed at initial evaluation unless otherwise dated) PATIENT SURVEYS:  FOTO 19% functional status   MUSCLE LENGTH: Limited hip flexor and quad flexibility   POSTURE:  Patient with decreased lumbar lordosis, slightly forward trunk lean   PALPATION: Tender mildly left lumbar paraspinals, more to left gluteal region   LUMBAR ROM:    AROM eval  Flexion WFL  Extension < 25%  Right lateral flexion 50%  Left lateral flexion 50%  Right rotation 50%  Left rotation 50%            Pain mainly with lumbar extension   LOWER EXTREMITY ROM:                          Hip ROM grossly WFL   LOWER EXTREMITY MMT:     MMT Right eval Left eval Right 05/10/22 Left 05/10/22  Hip flexion 4 4 4+ 4+  Hip extension 3- 3- 3- 3-  Hip abduction 3+ _0 Knee flexion 5 5    Knee extension 5 5      FUNCTIONAL TESTS:  5 times sit to stand: 28 seconds 05/10/22 5 x STS : 26 sec without UE   GAIT: Assistive device utilized: None Level of assistance: Complete Independence Comments: Slight forward trunk lean, antalgic on left     TODAY'S TREATMENT [facility] Adult PT Treatment:                                                DATE: 05/17/22 Therapeutic Exercise: MMT Bridge 10 sec x  10 LTR Hip flexor stretch EOM with intermittent active knee flexion Nustep L4 LE only Standing hip ext x 10 each Standing alternating H/S curls for quad stretch 5 x STS 26 sec without UE , mat , repeated x 1  Updated HEP   [facility] Adult PT Treatment:  DATE: 05/10/22 Therapeutic Exercise: MMT Bridge 10 sec x 10 LTR Hip flexor stretch EOM with intermittent active knee flexion Nustep L4 LE only Standing hip ext x 10 each Standing alternating H/S curls for quad stretch 5 x STS 26 sec without UE , mat , repeated x 1  Updated HEP  [facility] Adult PT Treatment:                                                DATE: 05/03/22 Therapeutic Exercise: Nustep L3 UE/LE x 5 minutes  Side clam 10 x 2 each Reverse clam 10 x 2  each  LTR with feet apart  Bridge x 10 Left figure 4 push and pull  Manual Therapy:  Soft Foam Roller to left lateral hip and low back   PATIENT EDUCATION:  Education details: HEP Person educated: Patient Education method: Consulting civil engineer, Media planner, Corporate treasurer cues, Verbal cues Education comprehension: verbalized understanding, returned demonstration, verbal cues required, tactile cues required, and needs further education   HOME EXERCISE PROGRAM: Access Code: [email or web address]     ASSESSMENT: CLINICAL IMPRESSION: Patient tolerated therapy well with no adverse effects. ***  Patient is a 81 y.o. female who was seen today for physical therapy treatment for chronic left sided low back pain and hip. She reports compliance with HEP although she has lost per paperwork. Reprinted HEP. Assessment of MMT hips reveals improvement in hip flexion and abduction, although still has 3-5 hip extension. In prone patient demonstrates decreased knee flexion with increased pain in Left low back with left knee flexion. Increased Bridge hold time and added STS and hamstring curls to HEP. She felt some LLB soreness intermittently with therex today.      OBJECTIVE IMPAIRMENTS: Abnormal gait, decreased activity tolerance, decreased mobility, decreased ROM, decreased strength, impaired flexibility, postural dysfunction, and pain.    ACTIVITY LIMITATIONS: bending, sitting, standing, squatting, sleeping, stairs, and locomotion level   PARTICIPATION LIMITATIONS: meal prep, cleaning, shopping, and community activity   PERSONAL FACTORS: Fitness, Past/current experiences, Time since onset of injury/illness/exacerbation, and 3+ comorbidities: see PMH above  are also affecting patient's functional outcome.      GOALS: Goals reviewed with patient? Yes   SHORT TERM GOALS: Target date: 05/17/2022   Patient will be I with initial HEP in order to progress with therapy. Baseline: HEP provided at eval Goal status: MET   2.  PT will review FOTO with patient by 3rd visit in order to understand expected progress and outcome with therapy. Baseline: FOTO assessed at eval Status: reviewed at 2nd visit Goal status: MET  3.  Patient will report left lower back and hip pain </= 5/10 with walking and activity in order to reduce functional limitations Baseline: 8-9/10 pain level 05/03/22: 5/10 pain 05/10/22: 0/10; min soreness in Left low back  Goal status: MET   LONG TERM GOALS: Target date: 06/14/2022   Patient will be I with final HEP to maintain progress from PT. Baseline: HEP provided at eval Goal status: INITIAL   2.  Patient will report >/= 40% status on FOTO to indicate improved functional ability. Baseline: 19% functional status Goal status: INITIAL   3.  Patient will demonstrate hip strength >/= 4-/5 MMT in order to improve walking tolerance and reduce pain with household tasks Baseline: strength deficits noted above 05/10/22: 3-/5 hip extension bilateral Goal status: ONGOING   4.  Patient will perform 5xSTS </= 15 seconds in order to indicate improved strength and reduced fall risk Baseline: 28 seconds 05/10/22: 26 sec , no UE Goal  status: INITIAL     PLAN: PT FREQUENCY: 1x/week   PT DURATION: 8 weeks   PLANNED INTERVENTIONS: Therapeutic exercises, Therapeutic activity, Neuromuscular re-education, Balance training, Gait training, Patient/Family education, Self Care, Joint mobilization, Joint manipulation, Aquatic Therapy, Dry Needling, Electrical stimulation, Spinal manipulation, Spinal mobilization, Cryotherapy, Moist heat, Manual therapy, and Re-evaluation.   PLAN FOR NEXT SESSION: Review HEP and progress PRN, manual for left hip and low back, progress hip strength and core stabilization; assess response to hip flexor stretches     Hilda Blades, PT, DPT, LAT, ATC 05/16/22  11:01 AM Phone: [phone] Fax: [phone]

## 2022-05-17 ENCOUNTER — Ambulatory Visit: Payer: Medicare HMO | Admitting: Physical Therapy

## 2022-05-17 ENCOUNTER — Ambulatory Visit: Payer: Medicare HMO | Admitting: Internal Medicine

## 2022-05-18 ENCOUNTER — Telehealth: Payer: Self-pay | Admitting: Physical Therapy

## 2022-05-18 NOTE — Telephone Encounter (Signed)
Spoke with patient's daughter regarding missed PT appointment on 05/17/2022. She states they forgot about the appointment due to the holidays. This was the last scheduled appointment and patient's daughter elected to see the doctor on 05/23/21 prior to schedule any further appointments for the patient. Provided reminder of attendance policy. Patient's daughter expressed understanding.  Hilda Blades, PT, DPT, LAT, ATC 05/18/22  10:46 AM Phone: [phone] Fax: [phone]

## 2022-05-18 NOTE — Progress Notes (Unsigned)
   I, Peterson Lombard, LAT, ATC acting as a scribe for Lynne Leader, MD.  Angelica [name] is a 81 y.o. female who presents to [place] at [facility] today for f/u LBP. Of note, pt under the care of oncology for stage 3 lung cancer diagnosed in Jan 2022.  Last seen by Dr. Georgina Snell on 04/11/2022 and was referred to PT, completing 4 visits.  Today, patient reports  Dx imaging: 04/11/2022 L-spine x-ray  Pertinent review of systems: ***  Relevant historical information: ***   Exam:  There were no vitals taken for this visit. General: Well Developed, well nourished, and in no acute distress.   MSK: ***    Lab and Radiology Results No results found for this or any previous visit (from the past 72 hour(s)). No results found.     Assessment and Plan: 81 y.o. female with ***   PDMP not reviewed this encounter. No orders of the defined types were placed in this encounter.  No orders of the defined types were placed in this encounter.    Discussed warning signs or symptoms. Please see discharge instructions. Patient expresses understanding.   ***

## 2022-05-21 ENCOUNTER — Other Ambulatory Visit: Payer: Self-pay | Admitting: Allergy

## 2022-05-23 ENCOUNTER — Ambulatory Visit: Payer: Medicare HMO | Admitting: Family Medicine

## 2022-05-23 VITALS — BP 130/74 | HR 76 | Ht 65.0 in | Wt 138.0 lb

## 2022-05-23 DIAGNOSIS — G8929 Other chronic pain: Secondary | ICD-10-CM | POA: Diagnosis not present

## 2022-05-23 DIAGNOSIS — M545 Low back pain, unspecified: Secondary | ICD-10-CM

## 2022-05-23 NOTE — Patient Instructions (Addendum)
Thank you for coming in today.   Glad you are feeling better!  Check back as needed  You should continue the home exercises.   Let me know if you need more PT.

## 2022-06-01 ENCOUNTER — Ambulatory Visit (INDEPENDENT_AMBULATORY_CARE_PROVIDER_SITE_OTHER): Payer: Medicare HMO | Admitting: Internal Medicine

## 2022-06-01 ENCOUNTER — Telehealth: Payer: Self-pay | Admitting: Internal Medicine

## 2022-06-01 VITALS — BP 116/62 | HR 89 | Temp 97.6°F | Ht 65.0 in | Wt 139.0 lb

## 2022-06-01 DIAGNOSIS — R0981 Nasal congestion: Secondary | ICD-10-CM

## 2022-06-01 DIAGNOSIS — I10 Essential (primary) hypertension: Secondary | ICD-10-CM

## 2022-06-01 DIAGNOSIS — J029 Acute pharyngitis, unspecified: Secondary | ICD-10-CM

## 2022-06-01 DIAGNOSIS — J4550 Severe persistent asthma, uncomplicated: Secondary | ICD-10-CM

## 2022-06-01 LAB — POC SOFIA SARS ANTIGEN FIA: SARS Coronavirus 2 Ag: NEGATIVE

## 2022-06-01 LAB — POC INFLUENZA A&B (BINAX/QUICKVUE)
Influenza A, POC: NEGATIVE
Influenza B, POC: NEGATIVE

## 2022-06-01 LAB — POCT RESPIRATORY SYNCYTIAL VIRUS: RSV Rapid Ag: NEGATIVE

## 2022-06-01 MED ORDER — AZITHROMYCIN 250 MG PO TABS: ORAL_TABLET | ORAL | 1 refills | Status: AC

## 2022-06-01 MED ORDER — HYDROCODONE BIT-HOMATROP MBR 5-1.5 MG/5ML PO SOLN: 5.0000 mL | Freq: Four times a day (QID) | ORAL | 0 refills | Status: AC | PRN

## 2022-06-01 MED ORDER — HYDROCODONE BIT-HOMATROP MBR 5-1.5 MG/5ML PO SOLN: 5.0000 mL | Freq: Four times a day (QID) | ORAL | 0 refills | Status: DC | PRN

## 2022-06-01 NOTE — Telephone Encounter (Signed)
Ok this is done 

## 2022-06-01 NOTE — Patient Instructions (Addendum)
Please take all new medication as prescribed - the antibiotic and cough medicine  Your COVID, Flu, and Rsv testing is negative  Please continue all other medications as before, and refills have been done if requested.  Please have the pharmacy call with any other refills you may need.  Please continue your efforts at being more active, low cholesterol diet, and weight control.  Please keep your appointments with your specialists as you may have planned  Please make an Appointment to return in 6 months, or sooner if needed

## 2022-06-01 NOTE — Progress Notes (Addendum)
Patient ID: Angelica [name], female   DOB: [birth year], 82 y.o.   MRN: [id number]        Chief Complaint: follow up severe ST x 3 days, Htn, asthma       HPI:  Angelica [name] is a 82 y.o. female  Here with 2-3 days acute onset feverish, severe ST pain, pressure, headache, general weakness and malaise, and greenish d/c, but pt denies chest pain, wheezing, increased sob or doe, orthopnea, PND, increased LE swelling, palpitations, dizziness or syncope.  Great grandchild was brought to her house after not being allowed to be a daycare due to fever.   Pt denies polydipsia, polyuria, or new focal neuro s/s.    Pt denies fever, wt loss, night sweats, loss of appetite, or other constitutional symptoms         Wt Readings from Last 3 Encounters:  06/01/22 139 lb (63 kg)  05/23/22 138 lb (62.6 kg)  05/03/22 143 lb (64.9 kg)   BP Readings from Last 3 Encounters:  06/01/22 116/62  05/23/22 130/74  05/03/22 138/78         Past Medical History:  Diagnosis Date   Arthritis    Asthma    Bowel obstruction (HCC)    Cancer (HCC)    Environmental allergies    GERD (gastroesophageal reflux disease)    Glaucoma 05/09/2017   H. pylori infection    History of radiation therapy 07/05/20-08/05/20   Left lung IMRT Dr. Roselind Messier    HLD (hyperlipidemia) 01/16/2019   Hypertension    Iron deficiency anemia    Osteopenia 05/26/2017   Thyroid disease    Urticaria 07/25/2018   Past Surgical History:  Procedure Laterality Date   ABDOMINAL HYSTERECTOMY     BOWEL RESECTION N/A 05/23/2020   Procedure: SMALL BOWEL REPAIR;  Surgeon: Sheliah Hatch, De Blanch, MD;  Location: [facility] OR;  Service: General;  Laterality: N/A;   BREAST BIOPSY     BRONCHIAL BIOPSY  06/10/2020   Procedure: BRONCHIAL BIOPSIES;  Surgeon: [name], DO;  Location: [facility];  Service: Pulmonary;;   BRONCHIAL BRUSHINGS  06/10/2020   Procedure: BRONCHIAL BRUSHINGS;  Surgeon: [name], DO;  Location: [facility];  Service: Pulmonary;;    BRONCHIAL NEEDLE ASPIRATION BIOPSY  06/10/2020   Procedure: BRONCHIAL NEEDLE ASPIRATION BIOPSIES;  Surgeon: [name], DO;  Location: [facility];  Service: Pulmonary;;   BRONCHIAL WASHINGS  06/10/2020   Procedure: BRONCHIAL WASHINGS;  Surgeon: [name], DO;  Location: [facility];  Service: Pulmonary;;   COLON SURGERY     DIAGNOSTIC LARYNGOSCOPY N/A 05/23/2020   Procedure: ATTEMPTED DIAGNOSTIC LAPAROSCOPY WITH ADHESIONS;  Surgeon: Sheliah Hatch, De Blanch, MD;  Location: [facility] OR;  Service: General;  Laterality: N/A;   IR IMAGING GUIDED PORT INSERTION  07/15/2020   KNEE SURGERY     LAPAROTOMY N/A 04/18/2015   Procedure: Exploratory laparotomy with lysis of adhesions, possible bowel resection;  Surgeon: Axel Filler, MD;  Location: [facility] OR;  Service: General;  Laterality: N/A;   LAPAROTOMY N/A 05/23/2020   Procedure: EXPLORATORY LAPAROTOMY;  Surgeon: Rodman Pickle, MD;  Location: [facility] OR;  Service: General;  Laterality: N/A;   LYSIS OF ADHESION N/A 05/23/2020   Procedure: LYSIS OF ADHESION;  Surgeon: Sheliah Hatch De Blanch, MD;  Location: [facility] OR;  Service: General;  Laterality: N/A;   NASAL SINUS SURGERY     THYROID SURGERY     VIDEO BRONCHOSCOPY WITH ENDOBRONCHIAL NAVIGATION Bilateral 06/10/2020   Procedure: VIDEO BRONCHOSCOPY WITH ENDOBRONCHIAL  NAVIGATION;  Surgeon: [name], DO;  Location: [facility];  Service: Pulmonary;  Laterality: Bilateral;   VIDEO BRONCHOSCOPY WITH ENDOBRONCHIAL ULTRASOUND  06/10/2020   Procedure: VIDEO BRONCHOSCOPY WITH ENDOBRONCHIAL ULTRASOUND;  Surgeon: [name], DO;  Location: [facility];  Service: Pulmonary;;    reports that she has quit smoking. Her smoking use included cigarettes. She has never used smokeless tobacco. She reports that she does not drink alcohol and does not use drugs. family history includes Diabetes in her mother; Glaucoma in her brother and sister; Hypertension in her father and mother. Allergies  Allergen Reactions    Asa Buff (Mag [Buffered Aspirin] Other (See Comments)    Excessive sweating   Ensure [Nutritional Supplements]     Or boost - upset stomach    Fish Allergy Other (See Comments)    Unknown reaction, but allergic   Other Other (See Comments)    Gelcaps; gel-containing capsules; extended-release medication - upset stomach    Peanut-Containing Drug Products Other (See Comments)    From allergy test   Penicillins Itching    Has patient had a PCN reaction causing immediate rash, facial/tongue/throat swelling, SOB or lightheadedness with hypotension: No Has patient had a PCN reaction causing severe rash involving mucus membranes or skin necrosis: No Has patient had a PCN reaction that required hospitalization No Has patient had a PCN reaction occurring within the last 10 years: No If all of the above answers are "NO", then may proceed with Cephalosporin use.   Shellfish-Derived Products Other (See Comments)    From allergy test   Strawberry Extract Other (See Comments)    From allergy test    Current Outpatient Medications on File Prior to Visit  Medication Sig Dispense Refill   acetaminophen (TYLENOL) 500 MG tablet Take 500 mg by mouth every 6 (six) hours as needed for moderate pain.     alum & mag hydroxide-simeth (MAALOX/MYLANTA) 200-200-20 MG/5ML suspension Take 15 mLs by mouth every 6 (six) hours as needed for indigestion or heartburn.     amLODipine (NORVASC) 10 MG tablet Take 1 tablet (10 mg total) by mouth daily. 90 tablet 3   Benralizumab (FASENRA) 30 MG/ML SOSY Inject 1 mL (30 mg total) into the skin every 8 (eight) weeks. 1 mL 6   clopidogrel (PLAVIX) 75 MG tablet Take 1 tablet (75 mg total) by mouth daily. 90 tablet 3   diclofenac Sodium (VOLTAREN) 1 % GEL Apply 4 g topically 4 (four) times daily. 100 g 0   EPINEPHrine (EPIPEN 2-PAK) 0.3 mg/0.3 mL IJ SOAJ injection Use as directed for severe allergic reaction 2 each 1   EQ ALLERGY RELIEF, CETIRIZINE, 10 MG tablet Take 1 tablet  by mouth once daily 90 tablet 0   fluticasone (FLONASE) 50 MCG/ACT nasal spray Place 1 spray into both nostrils in the morning and at bedtime. 16 g 5   ipratropium (ATROVENT) 0.06 % nasal spray Place 2 sprays into both nostrils 4 (four) times daily as needed (nasal drainage). 15 mL 5   linaclotide (LINZESS) 72 MCG capsule TAKE 1 CAPSULE BY MOUTH ONCE DAILY AS NEEDED 30 capsule 0   Magnesium Hydroxide (PHILLIPS MILK OF MAGNESIA PO) Take 15-30 mLs by mouth daily as needed (for constipation).     memantine (NAMENDA) 10 MG tablet Take 1 tablet (10 mg total) by mouth 2 (two) times daily. 60 tablet 5   ondansetron (ZOFRAN) 8 MG tablet Take 1 tablet (8 mg total) by mouth every 8 (eight) hours as  needed for nausea or vomiting. 20 tablet 0   polyethylene glycol (MIRALAX / GLYCOLAX) 17 g packet Take 17 g by mouth daily as needed for mild constipation.     pravastatin (PRAVACHOL) 80 MG tablet Take 1 tablet by mouth once daily 90 tablet 3   SYMBICORT 80-4.5 MCG/ACT inhaler INHALE 2 PUFFS BY MOUTH IN THE MORNING AND AT BEDTIME 10.2 g 5   Current Facility-Administered Medications on File Prior to Visit  Medication Dose Route Frequency Provider Last Rate Last Admin   Benralizumab SOSY 30 mg  30 mg Subcutaneous Q8 Weeks Marcelyn Bruins, MD   30 mg at 04/30/22 0945        ROS:  All others reviewed and negative.  Objective        PE:  BP 116/62 (BP Location: Right Arm, Patient Position: Sitting, Cuff Size: Normal)   Pulse 89   Temp 97.6 F (36.4 C) (Oral)   Ht 5\' 5"  (1.651 m)   Wt 139 lb (63 kg)   SpO2 99%   BMI 23.13 kg/m                 Constitutional: Pt appears in NAD               HENT: Head: NCAT.                Right Ear: External ear normal.                 Left Ear: External ear normal.  Bilat tm's with mild erythema.  Max sinus areas non tender.  Pharynx with very severe erythema, no exudate               Eyes: . Pupils are equal, round, and reactive to light. Conjunctivae and EOM  are normal               Nose: without d/c or deformity               Neck: Neck supple. Gross normal ROM               Cardiovascular: Normal rate and regular rhythm.                 Pulmonary/Chest: Effort normal and breath sounds without rales or wheezing.                Abd:  Soft, NT, ND, + BS, no organomegaly               Neurological: Pt is alert. At baseline orientation, motor grossly intact               Skin: Skin is warm. No rashes, no other new lesions, LE edema - none               Psychiatric: Pt behavior is normal without agitation   Micro: none  Cardiac tracings I have personally interpreted today:  none  Pertinent Radiological findings (summarize): none   Lab Results  Component Value Date   WBC 5.7 11/10/2021   HGB 13.9 11/10/2021   HCT 40.4 11/10/2021   PLT 246 11/10/2021   GLUCOSE 86 11/10/2021   CHOL 191 07/18/2021   TRIG 83.0 07/18/2021   HDL 77.50 07/18/2021   LDLCALC 97 07/18/2021   ALT 11 11/10/2021   AST 18 11/10/2021   NA 139 11/10/2021   K 3.4 (L) 11/10/2021   CL 106 11/10/2021   CREATININE 0.80 11/10/2021  BUN 9 11/10/2021   CO2 26 11/10/2021   TSH 3.94 07/18/2021   INR 1.0 11/02/2020   HGBA1C 5.8 07/18/2021   POCT - COVID - neg, RSV - neg, Flu A/B - neg  Assessment/Plan:  SHYLIE POLO is a 82 y.o. Black or African American [2] female with  has a past medical history of Arthritis, Asthma, Bowel obstruction (HCC), Cancer (HCC), Environmental allergies, GERD (gastroesophageal reflux disease), Glaucoma (05/09/2017), H. pylori infection, History of radiation therapy (07/05/20-08/05/20), HLD (hyperlipidemia) (01/16/2019), Hypertension, Iron deficiency anemia, Osteopenia (05/26/2017), Thyroid disease, and Urticaria (07/25/2018).  Asthma, severe persistent, well-controlled Stable, cont current inhaler prn  Acute pharyngitis Severe, with COVID/flu/rsv neg -  for antibx course zpack x 1,  to f/u any worsening symptoms or concerns   HTN  (hypertension) BP Readings from Last 3 Encounters:  06/01/22 116/62  05/23/22 130/74  05/03/22 138/78   Stable, pt to continue medical treatment norvasc 10 mg qd  Followup: Return if symptoms worsen or fail to improve.  Oliver Barre, MD 06/01/2022 8:42 AM [facility]

## 2022-06-01 NOTE — Assessment & Plan Note (Signed)
Severe, with COVID/flu/rsv neg -  for antibx course zpack x 1,  to f/u any worsening symptoms or concerns

## 2022-06-01 NOTE — Assessment & Plan Note (Signed)
BP Readings from Last 3 Encounters:  06/01/22 116/62  05/23/22 130/74  05/03/22 138/78   Stable, pt to continue medical treatment norvasc 10 mg qd

## 2022-06-01 NOTE — Assessment & Plan Note (Signed)
Stable, cont current inhaler prn

## 2022-06-01 NOTE — Telephone Encounter (Signed)
PT's daughter calls today following up on PT's visit today. PT had went to their regular pharmacy and was informed that they do not have the HYDROcodone bit-homatropine (HYCODAN) 5-1.5 MG/5ML syrup in stock.   PT's daughter was able to reach out to [facility] at [address], [address] [place] which does have it in stock. They would like this prescription resent to that new pharmacy.  CB if needed: [phone]

## 2022-06-12 ENCOUNTER — Other Ambulatory Visit: Payer: Self-pay | Admitting: *Deleted

## 2022-06-12 MED ORDER — FASENRA 30 MG/ML ~~LOC~~ SOSY: 30.0000 mg | PREFILLED_SYRINGE | SUBCUTANEOUS | 6 refills | Status: DC

## 2022-06-14 NOTE — Telephone Encounter (Signed)
Ok to take during the day, but consider half dose if gets sleepy with whole dose

## 2022-06-14 NOTE — Telephone Encounter (Signed)
Patient was prescribed cough meds to take at night but patients daughter is now requesting something that she can take for her cough during the day. What do you recommend, please advise

## 2022-06-14 NOTE — Telephone Encounter (Signed)
Patient and family informed that half dose is ok to take during the daytime

## 2022-06-14 NOTE — Telephone Encounter (Signed)
Patient's daughter Angelica [name] called and requested an alternative OTC cough syrup for daytime usage. She would like a message through MyChart or a call back at [phone].

## 2022-06-25 ENCOUNTER — Ambulatory Visit: Payer: Medicare HMO

## 2022-06-27 ENCOUNTER — Ambulatory Visit (INDEPENDENT_AMBULATORY_CARE_PROVIDER_SITE_OTHER): Payer: Medicare HMO

## 2022-06-27 DIAGNOSIS — J4550 Severe persistent asthma, uncomplicated: Secondary | ICD-10-CM

## 2022-07-05 ENCOUNTER — Other Ambulatory Visit: Payer: Self-pay | Admitting: Internal Medicine

## 2022-07-09 ENCOUNTER — Ambulatory Visit: Payer: Medicare HMO | Admitting: Family Medicine

## 2022-07-09 ENCOUNTER — Encounter: Payer: Self-pay | Admitting: Family Medicine

## 2022-07-09 ENCOUNTER — Inpatient Hospital Stay: Payer: Medicare HMO | Attending: Internal Medicine

## 2022-07-09 VITALS — BP 120/72 | HR 88 | Temp 98.3°F | Resp 20 | Ht 65.0 in | Wt 133.0 lb

## 2022-07-09 DIAGNOSIS — R197 Diarrhea, unspecified: Secondary | ICD-10-CM | POA: Diagnosis not present

## 2022-07-09 DIAGNOSIS — E039 Hypothyroidism, unspecified: Secondary | ICD-10-CM | POA: Diagnosis not present

## 2022-07-09 DIAGNOSIS — I10 Essential (primary) hypertension: Secondary | ICD-10-CM | POA: Insufficient documentation

## 2022-07-09 DIAGNOSIS — C3432 Malignant neoplasm of lower lobe, left bronchus or lung: Secondary | ICD-10-CM | POA: Insufficient documentation

## 2022-07-09 DIAGNOSIS — C3492 Malignant neoplasm of unspecified part of left bronchus or lung: Secondary | ICD-10-CM

## 2022-07-09 DIAGNOSIS — Z95828 Presence of other vascular implants and grafts: Secondary | ICD-10-CM

## 2022-07-09 DIAGNOSIS — Z923 Personal history of irradiation: Secondary | ICD-10-CM | POA: Diagnosis not present

## 2022-07-09 DIAGNOSIS — Z90710 Acquired absence of both cervix and uterus: Secondary | ICD-10-CM | POA: Diagnosis not present

## 2022-07-09 DIAGNOSIS — R1110 Vomiting, unspecified: Secondary | ICD-10-CM | POA: Diagnosis not present

## 2022-07-09 LAB — CMP (CANCER CENTER ONLY)
ALT: 7 U/L (ref 0–44)
AST: 13 U/L — ABNORMAL LOW (ref 15–41)
Albumin: 3.3 g/dL — ABNORMAL LOW (ref 3.5–5.0)
Alkaline Phosphatase: 70 U/L (ref 38–126)
Anion gap: 6 (ref 5–15)
BUN: 8 mg/dL (ref 8–23)
CO2: 27 mmol/L (ref 22–32)
Calcium: 8.8 mg/dL — ABNORMAL LOW (ref 8.9–10.3)
Chloride: 105 mmol/L (ref 98–111)
Creatinine: 0.65 mg/dL (ref 0.44–1.00)
GFR, Estimated: >60 mL/min (ref >60)
Glucose, Bld: 96 mg/dL (ref 70–99)
Potassium: 3.7 mmol/L (ref 3.5–5.1)
Sodium: 138 mmol/L (ref 135–145)
Total Bilirubin: 0.3 mg/dL (ref 0.3–1.2)
Total Protein: 7.2 g/dL (ref 6.5–8.1)

## 2022-07-09 LAB — CBC WITH DIFFERENTIAL (CANCER CENTER ONLY)
Abs Immature Granulocytes: 0.03 10*3/uL (ref 0.00–0.07)
Basophils Absolute: 0 10*3/uL (ref 0.0–0.1)
Basophils Relative: 1 %
Eosinophils Absolute: 0 10*3/uL (ref 0.0–0.5)
Eosinophils Relative: 0 %
HCT: 34.3 % — ABNORMAL LOW (ref 36.0–46.0)
Hemoglobin: 11.5 g/dL — ABNORMAL LOW (ref 12.0–15.0)
Immature Granulocytes: 1 %
Lymphocytes Relative: 27 %
Lymphs Abs: 1.7 10*3/uL (ref 0.7–4.0)
MCH: 27.4 pg (ref 26.0–34.0)
MCHC: 33.5 g/dL (ref 30.0–36.0)
MCV: 81.7 fL (ref 80.0–100.0)
Monocytes Absolute: 0.4 10*3/uL (ref 0.1–1.0)
Monocytes Relative: 7 %
Neutro Abs: 4.1 10*3/uL (ref 1.7–7.7)
Neutrophils Relative %: 64 %
Platelet Count: 356 10*3/uL (ref 150–400)
RBC: 4.2 MIL/uL (ref 3.87–5.11)
RDW: 15.9 % — ABNORMAL HIGH (ref 11.5–15.5)
WBC Count: 6.2 10*3/uL (ref 4.0–10.5)
nRBC: 0 % (ref 0.0–0.2)

## 2022-07-09 MED ORDER — SODIUM CHLORIDE 0.9% FLUSH
10.0000 mL | Freq: Once | INTRAVENOUS | Status: AC
  Administered 2022-07-09: 10 mL

## 2022-07-09 MED ORDER — ONDANSETRON 4 MG PO TBDP: 4.0000 mg | ORAL_TABLET | Freq: Three times a day (TID) | ORAL | 0 refills | Status: AC | PRN

## 2022-07-09 MED ORDER — HEPARIN SOD (PORK) LOCK FLUSH 100 UNIT/ML IV SOLN
500.0000 [IU] | Freq: Once | INTRAVENOUS | Status: AC
  Administered 2022-07-09: 500 [IU]

## 2022-07-09 NOTE — Patient Instructions (Signed)
Ice chips, Ginger Ale, Sprite, popsicles, etc to stay hydrated.

## 2022-07-09 NOTE — Progress Notes (Signed)
Assessment & Plan:  1. Vomiting and diarrhea Discussed this may have been related to something she ate or may be the beginning of a viral stomach bug.  Zofran prescribed to take if needed.  Advised if she starts having diarrhea and not to take Imodium to stop it.  Education provided on vomiting.  Encouraged patient to stay well-hydrated with ginger ale, Sprite, popsicles, ice chips, etc. - ondansetron (ZOFRAN-ODT) 4 MG disintegrating tablet; Take 1 tablet (4 mg total) by mouth every 8 (eight) hours as needed for nausea or vomiting.  Dispense: 10 tablet; Refill: 0   Follow up plan: Return if symptoms worsen or fail to improve.  Hendricks Limes, MSN, APRN, FNP-C  Subjective:  HPI: Angelica [name] is a 82 y.o. female presenting on 07/09/2022 for Emesis  Patient is accompanied by her daughter, who she is okay with being present.  Patient had two episodes of vomiting around lunchtime after drinking a Coke.  The vomit was brown in color just like the Coke.  She had one episode of loose stools afterwards.  Denies any nausea, abdominal pain, or contacts with others with similar symptoms.   ROS: Negative unless specifically indicated above in HPI.   Relevant past medical history reviewed and updated as indicated.   Allergies and medications reviewed and updated.   Current Outpatient Medications:    acetaminophen (TYLENOL) 500 MG tablet, Take 500 mg by mouth every 6 (six) hours as needed for moderate pain., Disp: , Rfl:    alum & mag hydroxide-simeth (MAALOX/MYLANTA) 200-200-20 MG/5ML suspension, Take 15 mLs by mouth every 6 (six) hours as needed for indigestion or heartburn., Disp: , Rfl:    amLODipine (NORVASC) 10 MG tablet, Take 1 tablet (10 mg total) by mouth daily., Disp: 90 tablet, Rfl: 3   Benralizumab (FASENRA) 30 MG/ML SOSY, Inject 1 mL (30 mg total) into the skin every 8 (eight) weeks., Disp: 1 mL, Rfl: 6   clopidogrel (PLAVIX) 75 MG tablet, Take 1 tablet by mouth once daily, Disp: 90  tablet, Rfl: 3   diclofenac Sodium (VOLTAREN) 1 % GEL, Apply 4 g topically 4 (four) times daily., Disp: 100 g, Rfl: 0   EQ ALLERGY RELIEF, CETIRIZINE, 10 MG tablet, Take 1 tablet by mouth once daily, Disp: 90 tablet, Rfl: 0   fluticasone (FLONASE) 50 MCG/ACT nasal spray, Place 1 spray into both nostrils in the morning and at bedtime., Disp: 16 g, Rfl: 5   ipratropium (ATROVENT) 0.06 % nasal spray, Place 2 sprays into both nostrils 4 (four) times daily as needed (nasal drainage)., Disp: 15 mL, Rfl: 5   linaclotide (LINZESS) 72 MCG capsule, TAKE 1 CAPSULE BY MOUTH ONCE DAILY AS NEEDED, Disp: 30 capsule, Rfl: 0   Magnesium Hydroxide (PHILLIPS MILK OF MAGNESIA PO), Take 15-30 mLs by mouth daily as needed (for constipation)., Disp: , Rfl:    memantine (NAMENDA) 10 MG tablet, Take 1 tablet (10 mg total) by mouth 2 (two) times daily., Disp: 60 tablet, Rfl: 5   ondansetron (ZOFRAN) 8 MG tablet, Take 1 tablet (8 mg total) by mouth every 8 (eight) hours as needed for nausea or vomiting., Disp: 20 tablet, Rfl: 0   polyethylene glycol (MIRALAX / GLYCOLAX) 17 g packet, Take 17 g by mouth daily as needed for mild constipation., Disp: , Rfl:    pravastatin (PRAVACHOL) 80 MG tablet, Take 1 tablet by mouth once daily, Disp: 90 tablet, Rfl: 3   SYMBICORT 80-4.5 MCG/ACT inhaler, INHALE 2 PUFFS BY MOUTH IN  THE MORNING AND AT BEDTIME, Disp: 10.2 g, Rfl: 5   EPINEPHrine (EPIPEN 2-PAK) 0.3 mg/0.3 mL IJ SOAJ injection, Use as directed for severe allergic reaction (Patient not taking: Reported on 07/09/2022), Disp: 2 each, Rfl: 1  Current Facility-Administered Medications:    Benralizumab SOSY 30 mg, 30 mg, Subcutaneous, Q8 Weeks, Padgett, Rae Halsted, MD, 30 mg at 06/27/22 1445  Allergies  Allergen Reactions   Asa Buff (Mag [Buffered Aspirin] Other (See Comments)    Excessive sweating   Ensure [Nutritional Supplements]     Or boost - upset stomach    Fish Allergy Other (See Comments)    Unknown reaction, but  allergic   Other Other (See Comments)    Gelcaps; gel-containing capsules; extended-release medication - upset stomach    Peanut-Containing Drug Products Other (See Comments)    From allergy test   Penicillins Itching    Has patient had a PCN reaction causing immediate rash, facial/tongue/throat swelling, SOB or lightheadedness with hypotension: No Has patient had a PCN reaction causing severe rash involving mucus membranes or skin necrosis: No Has patient had a PCN reaction that required hospitalization No Has patient had a PCN reaction occurring within the last 10 years: No If all of the above answers are "NO", then may proceed with Cephalosporin use.   Shellfish-Derived Products Other (See Comments)    From allergy test   Strawberry Extract Other (See Comments)    From allergy test     Objective:   BP 120/72   Pulse 88   Temp 98.3 F (36.8 C)   Resp 20   Ht 5\' 5"  (1.651 m)   Wt 133 lb (60.3 kg)   BMI 22.13 kg/m    Physical Exam Vitals reviewed.  Constitutional:      General: She is not in acute distress.    Appearance: Normal appearance. She is not ill-appearing, toxic-appearing or diaphoretic.  HENT:     Head: Normocephalic and atraumatic.  Eyes:     General: No scleral icterus.       Right eye: No discharge.        Left eye: No discharge.     Conjunctiva/sclera: Conjunctivae normal.  Cardiovascular:     Rate and Rhythm: Normal rate.  Pulmonary:     Effort: Pulmonary effort is normal. No respiratory distress.  Abdominal:     General: Abdomen is flat. Bowel sounds are normal. There is no distension.     Palpations: Abdomen is soft. There is no mass.     Tenderness: There is no abdominal tenderness. There is no right CVA tenderness, left CVA tenderness, guarding or rebound.     Hernia: No hernia is present.  Musculoskeletal:        General: Normal range of motion.     Cervical back: Normal range of motion.  Skin:    General: Skin is warm and dry.     Capillary  Refill: Capillary refill takes less than 2 seconds.  Neurological:     General: No focal deficit present.     Mental Status: She is alert and oriented to person, place, and time. Mental status is at baseline.  Psychiatric:        Mood and Affect: Mood normal.        Behavior: Behavior normal.        Thought Content: Thought content normal.        Judgment: Judgment normal.

## 2022-07-10 ENCOUNTER — Ambulatory Visit
Admission: RE | Admit: 2022-07-10 | Discharge: 2022-07-10 | Disposition: A | Discharge status: Home / Self Care | Payer: Medicare HMO | Source: Ambulatory Visit | Attending: Physician Assistant | Admitting: Physician Assistant

## 2022-07-10 ENCOUNTER — Other Ambulatory Visit: Payer: Self-pay | Admitting: Medical Oncology

## 2022-07-10 DIAGNOSIS — C3492 Malignant neoplasm of unspecified part of left bronchus or lung: Secondary | ICD-10-CM

## 2022-07-10 DIAGNOSIS — R918 Other nonspecific abnormal finding of lung field: Secondary | ICD-10-CM | POA: Diagnosis not present

## 2022-07-10 DIAGNOSIS — C3490 Malignant neoplasm of unspecified part of unspecified bronchus or lung: Secondary | ICD-10-CM | POA: Diagnosis not present

## 2022-07-10 DIAGNOSIS — I700 Atherosclerosis of aorta: Secondary | ICD-10-CM | POA: Diagnosis not present

## 2022-07-10 DIAGNOSIS — J479 Bronchiectasis, uncomplicated: Secondary | ICD-10-CM | POA: Diagnosis not present

## 2022-07-10 MED ORDER — HEPARIN SOD (PORK) LOCK FLUSH 100 UNIT/ML IV SOLN
500.0000 [IU] | Freq: Once | INTRAVENOUS | Status: AC
  Administered 2022-07-10: 500 [IU] via INTRAVENOUS

## 2022-07-10 MED ORDER — IOPAMIDOL (ISOVUE-300) INJECTION 61%
75.0000 mL | Freq: Once | INTRAVENOUS | Status: AC | PRN
  Administered 2022-07-10: 75 mL via INTRAVENOUS

## 2022-07-10 MED ORDER — SODIUM CHLORIDE 0.9% FLUSH
10.0000 mL | INTRAVENOUS | Status: DC | PRN
  Administered 2022-07-10: 10 mL via INTRAVENOUS

## 2022-07-12 ENCOUNTER — Other Ambulatory Visit: Payer: Self-pay

## 2022-07-12 ENCOUNTER — Inpatient Hospital Stay (HOSPITAL_BASED_OUTPATIENT_CLINIC_OR_DEPARTMENT_OTHER): Payer: Medicare HMO | Admitting: Internal Medicine

## 2022-07-12 VITALS — BP 130/79 | HR 71 | Temp 98.1°F | Resp 16 | Wt 134.2 lb

## 2022-07-12 DIAGNOSIS — C3432 Malignant neoplasm of lower lobe, left bronchus or lung: Secondary | ICD-10-CM | POA: Diagnosis not present

## 2022-07-12 DIAGNOSIS — C3490 Malignant neoplasm of unspecified part of unspecified bronchus or lung: Secondary | ICD-10-CM | POA: Diagnosis not present

## 2022-07-12 DIAGNOSIS — Z923 Personal history of irradiation: Secondary | ICD-10-CM | POA: Diagnosis not present

## 2022-07-12 DIAGNOSIS — E039 Hypothyroidism, unspecified: Secondary | ICD-10-CM | POA: Diagnosis not present

## 2022-07-12 DIAGNOSIS — Z90710 Acquired absence of both cervix and uterus: Secondary | ICD-10-CM | POA: Diagnosis not present

## 2022-07-12 DIAGNOSIS — I10 Essential (primary) hypertension: Secondary | ICD-10-CM | POA: Diagnosis not present

## 2022-07-12 NOTE — Progress Notes (Signed)
Angelica [name]   [phone]  OFFICE PROGRESS NOTE  [name], MD [place] [address]  DIAGNOSIS: Stage IIIB (T2 a, N3, M0) non-small cell lung cancer, adenocarcinoma presented with left lower lobe lung mass in addition to left hilar and mediastinal as well as right and left cervical lymphadenopathy diagnosed in January 2022  PRIOR THERAPY:  1) Concurrent chemoradiation with weekly carboplatin for AUC of 2 and paclitaxel 45 mg/M2.  Status post 5 cycles.  Last dose was given August 01, 2020. 2) Consolidation treatment with immunotherapy with Imfinzi 1500 mg IV every 4 weeks.  First dose September 06, 2020.  Status post 3 cycles.  This treatment was discontinued secondary to intolerance and hospital admission with sepsis.  CURRENT THERAPY: Observation.  INTERVAL HISTORY: Angelica [name] 82 y.o. female returns to the clinic today for follow-up visit accompanied by her daughter.  The patient is feeling fine today with no concerning complaints.  She denied having any chest pain, shortness of breath except with exertion with no cough or hemoptysis.  She has no nausea, vomiting, diarrhea or constipation.  She has no headache or visual changes.  She continues to have mild fatigue and weakness.  She has repeat CT scan of the chest performed recently and she is here for evaluation and discussion of her scan results.  MEDICAL HISTORY: Past Medical History:  Diagnosis Date   Arthritis    Asthma    Bowel obstruction (HCC)    Cancer (HCC)    Environmental allergies    GERD (gastroesophageal reflux disease)    Glaucoma 05/09/2017   H. pylori infection    History of radiation therapy 07/05/20-08/05/20   Left lung IMRT Dr. Sondra Come    HLD (hyperlipidemia) 01/16/2019   Hypertension    Iron deficiency anemia    Osteopenia 05/26/2017   Thyroid disease    Urticaria 07/25/2018    ALLERGIES:  is allergic to asa buff (mag [buffered  aspirin], ensure [nutritional supplements], fish allergy, other, peanut-containing drug products, penicillins, shellfish-derived products, and strawberry extract.  MEDICATIONS:  Current Outpatient Medications  Medication Sig Dispense Refill   acetaminophen (TYLENOL) 500 MG tablet Take 500 mg by mouth every 6 (six) hours as needed for moderate pain.     alum & mag hydroxide-simeth (MAALOX/MYLANTA) 200-200-20 MG/5ML suspension Take 15 mLs by mouth every 6 (six) hours as needed for indigestion or heartburn.     amLODipine (NORVASC) 10 MG tablet Take 1 tablet (10 mg total) by mouth daily. 90 tablet 3   Benralizumab (FASENRA) 30 MG/ML SOSY Inject 1 mL (30 mg total) into the skin every 8 (eight) weeks. 1 mL 6   clopidogrel (PLAVIX) 75 MG tablet Take 1 tablet by mouth once daily 90 tablet 3   diclofenac Sodium (VOLTAREN) 1 % GEL Apply 4 g topically 4 (four) times daily. 100 g 0   EPINEPHrine (EPIPEN 2-PAK) 0.3 mg/0.3 mL IJ SOAJ injection Use as directed for severe allergic reaction (Patient not taking: Reported on 07/09/2022) 2 each 1   EQ ALLERGY RELIEF, CETIRIZINE, 10 MG tablet Take 1 tablet by mouth once daily 90 tablet 0   fluticasone (FLONASE) 50 MCG/ACT nasal spray Place 1 spray into both nostrils in the morning and at bedtime. 16 g 5   ipratropium (ATROVENT) 0.06 % nasal spray Place 2 sprays into both nostrils 4 (four) times daily as needed (nasal drainage). 15 mL 5   linaclotide (LINZESS) 72  MCG capsule TAKE 1 CAPSULE BY MOUTH ONCE DAILY AS NEEDED 30 capsule 0   Magnesium Hydroxide (PHILLIPS MILK OF MAGNESIA PO) Take 15-30 mLs by mouth daily as needed (for constipation).     memantine (NAMENDA) 10 MG tablet Take 1 tablet (10 mg total) by mouth 2 (two) times daily. 60 tablet 5   ondansetron (ZOFRAN-ODT) 4 MG disintegrating tablet Take 1 tablet (4 mg total) by mouth every 8 (eight) hours as needed for nausea or vomiting. 10 tablet 0   polyethylene glycol (MIRALAX / GLYCOLAX) 17 g packet Take 17 g by  mouth daily as needed for mild constipation.     pravastatin (PRAVACHOL) 80 MG tablet Take 1 tablet by mouth once daily 90 tablet 3   SYMBICORT 80-4.5 MCG/ACT inhaler INHALE 2 PUFFS BY MOUTH IN THE MORNING AND AT BEDTIME 10.2 g 5   Current Facility-Administered Medications  Medication Dose Route Frequency Provider Last Rate Last Admin   Benralizumab SOSY 30 mg  30 mg Subcutaneous Q8 Weeks Kennith Gain, MD   30 mg at 06/27/22 1445    SURGICAL HISTORY:  Past Surgical History:  Procedure Laterality Date   ABDOMINAL HYSTERECTOMY     BOWEL RESECTION N/A 05/23/2020   Procedure: SMALL BOWEL REPAIR;  Surgeon: Kieth Brightly Arta Bruce, MD;  Location: [facility];  Service: General;  Laterality: N/A;   BREAST BIOPSY     BRONCHIAL BIOPSY  06/10/2020   Procedure: BRONCHIAL BIOPSIES;  Surgeon: Garner Nash, DO;  Location: [facility];  Service: Pulmonary;;   BRONCHIAL BRUSHINGS  06/10/2020   Procedure: BRONCHIAL BRUSHINGS;  Surgeon: Garner Nash, DO;  Location: [facility];  Service: Pulmonary;;   BRONCHIAL NEEDLE ASPIRATION BIOPSY  06/10/2020   Procedure: BRONCHIAL NEEDLE ASPIRATION BIOPSIES;  Surgeon: Garner Nash, DO;  Location: [facility];  Service: Pulmonary;;   BRONCHIAL WASHINGS  06/10/2020   Procedure: BRONCHIAL WASHINGS;  Surgeon: Garner Nash, DO;  Location: [facility];  Service: Pulmonary;;   COLON SURGERY     DIAGNOSTIC LARYNGOSCOPY N/A 05/23/2020   Procedure: ATTEMPTED DIAGNOSTIC LAPAROSCOPY WITH ADHESIONS;  Surgeon: Kieth Brightly, Arta Bruce, MD;  Location: [facility];  Service: General;  Laterality: N/A;   IR IMAGING GUIDED PORT INSERTION  07/15/2020   KNEE SURGERY     LAPAROTOMY N/A 04/18/2015   Procedure: Exploratory laparotomy with lysis of adhesions, possible bowel resection;  Surgeon: Ralene Ok, MD;  Location: [facility];  Service: General;  Laterality: N/A;   LAPAROTOMY N/A 05/23/2020   Procedure: EXPLORATORY LAPAROTOMY;  Surgeon: Mickeal Skinner, MD;  Location:  [facility];  Service: General;  Laterality: N/A;   LYSIS OF ADHESION N/A 05/23/2020   Procedure: LYSIS OF ADHESION;  Surgeon: Kieth Brightly Arta Bruce, MD;  Location: [facility];  Service: General;  Laterality: N/A;   NASAL SINUS SURGERY     THYROID SURGERY     VIDEO BRONCHOSCOPY WITH ENDOBRONCHIAL NAVIGATION Bilateral 06/10/2020   Procedure: VIDEO BRONCHOSCOPY WITH ENDOBRONCHIAL NAVIGATION;  Surgeon: Garner Nash, DO;  Location: [facility];  Service: Pulmonary;  Laterality: Bilateral;   VIDEO BRONCHOSCOPY WITH ENDOBRONCHIAL ULTRASOUND  06/10/2020   Procedure: VIDEO BRONCHOSCOPY WITH ENDOBRONCHIAL ULTRASOUND;  Surgeon: Garner Nash, DO;  Location: [facility];  Service: Pulmonary;;    REVIEW OF SYSTEMS:  A comprehensive review of systems was negative except for: Constitutional: positive for fatigue Musculoskeletal: positive for muscle weakness   PHYSICAL EXAMINATION: General appearance: alert, cooperative, and no distress Head: Normocephalic, without obvious abnormality, atraumatic Neck: no adenopathy, no JVD,  supple, symmetrical, trachea midline, and thyroid not enlarged, symmetric, no tenderness/mass/nodules Lymph nodes: Cervical, supraclavicular, and axillary nodes normal. Resp: clear to auscultation bilaterally Back: symmetric, no curvature. ROM normal. No CVA tenderness. Cardio: regular rate and rhythm, S1, S2 normal, no murmur, click, rub or gallop GI: soft, non-tender; bowel sounds normal; no masses,  no organomegaly Extremities: extremities normal, atraumatic, no cyanosis or edema  ECOG PERFORMANCE STATUS: 1 - Symptomatic but completely ambulatory  Blood pressure 130/79, pulse 71, temperature 98.1 F (36.7 C), temperature source Oral, resp. rate 16, weight 134 lb 3 oz (60.9 kg), SpO2 100 %.  LABORATORY DATA: Lab Results  Component Value Date   WBC 6.2 07/09/2022   HGB 11.5 (L) 07/09/2022   HCT 34.3 (L) 07/09/2022   MCV 81.7 07/09/2022   PLT 356 07/09/2022      Chemistry       Component Value Date/Time   NA 138 07/09/2022 0837   K 3.7 07/09/2022 0837   CL 105 07/09/2022 0837   CO2 27 07/09/2022 0837   BUN 8 07/09/2022 0837   CREATININE 0.65 07/09/2022 0837      Component Value Date/Time   CALCIUM 8.8 (L) 07/09/2022 0837   ALKPHOS 70 07/09/2022 0837   AST 13 (L) 07/09/2022 0837   ALT 7 07/09/2022 0837   BILITOT 0.3 07/09/2022 0837       RADIOGRAPHIC STUDIES: CT Chest W Contrast  Result Date: 07/11/2022 CLINICAL DATA:  Non-small cell lung cancer. Assess treatment response. Chemotherapy and radiotherapy patient therapy. * Tracking Code: BO * EXAM: CT CHEST WITH CONTRAST TECHNIQUE: Multidetector CT imaging of the chest was performed during intravenous contrast administration. RADIATION DOSE REDUCTION: This exam was performed according to the departmental dose-optimization program which includes automated exposure control, adjustment of the mA and/or kV according to patient size and/or use of iterative reconstruction technique. CONTRAST:  27mL ISOVUE-300 IOPAMIDOL (ISOVUE-300) INJECTION 61% COMPARISON:  None Available. FINDINGS: Cardiovascular: Coronary artery calcification and aortic atherosclerotic calcification. Mediastinum/Nodes: No axillary or supraclavicular adenopathy. No mediastinal or hilar adenopathy. No pericardial fluid. Esophagus normal. Lungs/Pleura: 2 small adjacent small focus ground-glass nodularity in the LEFT upper lobe (image 54/8) are new from prior. This has infectious pattern. Within the medial LEFT lower lobe infrahilar peribronchial thickening and bronchiectasis is unchanged . No suspicious pulmonary nodularity. Upper Abdomen: Limited view of the liver, kidneys, pancreas are unremarkable. Normal adrenal glands. Musculoskeletal: No aggressive osseous lesion. IMPRESSION: 1. No evidence of lung cancer recurrence. 2. New small focus of ground-glass nodularity in the LEFT upper lobe is favored infectious or inflammatory. Recommend attention on routine  follow-up. 3. Stable post therapy change in the LEFT lower lobe. 4.  Aortic Atherosclerosis (ICD10-I70.0). Electronically Signed   By: Suzy Bouchard M.D.   On: 07/11/2022 08:59     ASSESSMENT AND PLAN: This is a very pleasant 82 years old African-American female recently diagnosed with a stage IIIb (T2a, N3, M0) non-small cell lung cancer, adenocarcinoma presented with left lower lobe lung mass in addition to left hilar and mediastinal lymphadenopathy as well as right and left cervical lymphadenopathy diagnosed in January 2022. The patient underwent a course of concurrent chemoradiation with weekly carboplatin and paclitaxel status post 5 cycles with partial response.   The patient underwent consolidation treatment with Imfinzi 1500 Mg IV every 4 weeks status post 3 cycles.  She has been tolerating this treatment well but during the last infusion she developed systemic inflammatory response syndrome with fever and blood work showed bacteremia secondary to  Klebsiella pneumonia treated with antibiotic during her hospitalization.  She is currently on observation. The patient is feeling fine today with no concerning complaints except for mild fatigue. She had repeat CT scan of the chest performed recently.  I personally and independently reviewed the scan images and discussed the results with the patient and her daughter. Her scan showed no concerning findings for disease recurrence or metastasis.  It showed new small focus of groundglass opacity in the left upper lobe suspicious for inflammatory process but the patient is currently asymptomatic. I recommended for her to continue on observation with repeat CT scan of the chest in 6 months. The patient will continue to have Port-A-Cath flush every 2 months. Regarding the immunotherapy mediated hypothyroidism, she is followed by her primary care physician for management of this condition. She was advised to call immediately if she has any other concerning  symptoms in the interval. The patient voices understanding of current disease status and treatment options and is in agreement with the current care plan.  All questions were answered. The patient knows to call the clinic with any problems, questions or concerns. We can certainly see the patient much sooner if necessary.  Disclaimer: This note was dictated with voice recognition software. Similar sounding words can inadvertently be transcribed and may not be corrected upon review.

## 2022-07-16 ENCOUNTER — Telehealth: Payer: Self-pay | Admitting: Internal Medicine

## 2022-07-16 NOTE — Telephone Encounter (Signed)
PT's daughter visits today with DMV dissability placard forms to be filled out by Dr.John. Forms have been placed in Dr.John's mailbox and PT's daughter would like to be notified once these are filled.  CB: [phone]

## 2022-07-17 ENCOUNTER — Inpatient Hospital Stay: Payer: Medicare HMO

## 2022-07-18 NOTE — Telephone Encounter (Signed)
Form received, completion in progress

## 2022-07-19 NOTE — Telephone Encounter (Signed)
Form completed, patients daughter notified for pick up

## 2022-07-22 ENCOUNTER — Other Ambulatory Visit: Payer: Self-pay | Admitting: Internal Medicine

## 2022-07-22 NOTE — Telephone Encounter (Signed)
Please refill as per office routine med refill policy (all routine meds to be refilled for 3 mo or monthly (per pt preference) up to one year from last visit, then month to month grace period for 3 mo, then further med refills will have to be denied) ? ?

## 2022-08-15 ENCOUNTER — Ambulatory Visit (INDEPENDENT_AMBULATORY_CARE_PROVIDER_SITE_OTHER): Payer: Medicare HMO | Admitting: Allergy

## 2022-08-15 ENCOUNTER — Encounter: Payer: Self-pay | Admitting: Allergy

## 2022-08-15 ENCOUNTER — Other Ambulatory Visit: Payer: Self-pay

## 2022-08-15 VITALS — BP 110/70 | HR 74 | Temp 99.4°F | Resp 16

## 2022-08-15 DIAGNOSIS — J310 Chronic rhinitis: Secondary | ICD-10-CM | POA: Diagnosis not present

## 2022-08-15 DIAGNOSIS — K219 Gastro-esophageal reflux disease without esophagitis: Secondary | ICD-10-CM | POA: Diagnosis not present

## 2022-08-15 DIAGNOSIS — L509 Urticaria, unspecified: Secondary | ICD-10-CM

## 2022-08-15 DIAGNOSIS — J339 Nasal polyp, unspecified: Secondary | ICD-10-CM

## 2022-08-15 DIAGNOSIS — J4550 Severe persistent asthma, uncomplicated: Secondary | ICD-10-CM | POA: Diagnosis not present

## 2022-08-15 NOTE — Progress Notes (Signed)
Follow-up Note  RE: Angelica [name] MRN: [id number] DOB: [birth year] Date of Office Visit: 08/15/2022   History of present illness: Angelica [name] is a 82 y.o. female presenting today for follow-up of chronic rhinitis with allergic component as well has asthma, LPRD, nasal polyposis and urticaria.  She was last seen in the office on 02/14/2022 by myself.  She presents today with her daughter. She states that she has been doing well since the last visit and has no complaints today. She states her asthma has been doing very well.  She does not report for her rescue inhaler.  She has not had any ED or urgent care visits or systemic steroid needs for her asthma.  She continues on Symbicort 2 puffs twice a day.  She does rinse her mouth after use.  She also continues on Fasenra injections every 8 weeks that she continues to tolerate very well.  Her next dose is due next week.  She states that Berna Bue has done wonders in regards to her asthma control. She states her allergy symptoms have been much better.  She does note runny nose sometimes when it rains.  She also notes some nasal congestion at nighttime.  However the symptoms are definitely improved for her.  She states she will use the ipratropium nasal spray as needed and it does help with drainage control and she needs to use it.  She does continue to take Singulair as well as Zyrtec daily.  She does find Zyrtec more effective than Claritin. She has not had any further urticarial outbreaks. Sting does not report any reflux symptoms and has not needed to use any antireflux medications in quite a while.    Review of systems: Review of Systems  Constitutional: Negative.   HENT: Negative.    Eyes: Negative.   Respiratory: Negative.    Cardiovascular: Negative.   Gastrointestinal: Negative.   Musculoskeletal: Negative.   Skin: Negative.   Allergic/Immunologic: Negative.   Neurological: Negative.      All other systems negative unless noted  above in HPI  Past medical/social/surgical/family history have been reviewed and are unchanged unless specifically indicated below.  No changes  Medication List: Current Outpatient Medications  Medication Sig Dispense Refill   acetaminophen (TYLENOL) 500 MG tablet Take 500 mg by mouth every 6 (six) hours as needed for moderate pain.     alum & mag hydroxide-simeth (MAALOX/MYLANTA) 200-200-20 MG/5ML suspension Take 15 mLs by mouth every 6 (six) hours as needed for indigestion or heartburn.     amLODipine (NORVASC) 10 MG tablet Take 1 tablet (10 mg total) by mouth daily. 90 tablet 3   Benralizumab (FASENRA) 30 MG/ML SOSY Inject 1 mL (30 mg total) into the skin every 8 (eight) weeks. 1 mL 6   clopidogrel (PLAVIX) 75 MG tablet Take 1 tablet by mouth once daily 90 tablet 3   diclofenac Sodium (VOLTAREN) 1 % GEL Apply 4 g topically 4 (four) times daily. 100 g 0   EQ ALLERGY RELIEF, CETIRIZINE, 10 MG tablet Take 1 tablet by mouth once daily 90 tablet 0   fluticasone (FLONASE) 50 MCG/ACT nasal spray Place 1 spray into both nostrils in the morning and at bedtime. 16 g 5   ipratropium (ATROVENT) 0.06 % nasal spray Place 2 sprays into both nostrils 4 (four) times daily as needed (nasal drainage). 15 mL 5   linaclotide (LINZESS) 72 MCG capsule TAKE 1 CAPSULE BY MOUTH ONCE DAILY AS NEEDED 30 capsule 0  Magnesium Hydroxide (PHILLIPS MILK OF MAGNESIA PO) Take 15-30 mLs by mouth daily as needed (for constipation).     memantine (NAMENDA) 10 MG tablet Take 1 tablet (10 mg total) by mouth 2 (two) times daily. 60 tablet 5   ondansetron (ZOFRAN-ODT) 4 MG disintegrating tablet Take 1 tablet (4 mg total) by mouth every 8 (eight) hours as needed for nausea or vomiting. 10 tablet 0   polyethylene glycol (MIRALAX / GLYCOLAX) 17 g packet Take 17 g by mouth daily as needed for mild constipation.     pravastatin (PRAVACHOL) 80 MG tablet Take 1 tablet by mouth once daily 90 tablet 0   SYMBICORT 80-4.5 MCG/ACT inhaler  INHALE 2 PUFFS BY MOUTH IN THE MORNING AND AT BEDTIME 10.2 g 5   EPINEPHrine (EPIPEN 2-PAK) 0.3 mg/0.3 mL IJ SOAJ injection Use as directed for severe allergic reaction (Patient not taking: Reported on 07/09/2022) 2 each 1   Current Facility-Administered Medications  Medication Dose Route Frequency Provider Last Rate Last Admin   Benralizumab SOSY 30 mg  30 mg Subcutaneous Q8 Weeks Kennith Gain, MD   30 mg at 06/27/22 1445     Known medication allergies: Allergies  Allergen Reactions   Asa Buff (Mag [Buffered Aspirin] Other (See Comments)    Excessive sweating   Ensure [Nutritional Supplements]     Or boost - upset stomach    Fish Allergy Other (See Comments)    Unknown reaction, but allergic   Other Other (See Comments)    Gelcaps; gel-containing capsules; extended-release medication - upset stomach    Peanut-Containing Drug Products Other (See Comments)    From allergy test   Penicillins Itching    Has patient had a PCN reaction causing immediate rash, facial/tongue/throat swelling, SOB or lightheadedness with hypotension: No Has patient had a PCN reaction causing severe rash involving mucus membranes or skin necrosis: No Has patient had a PCN reaction that required hospitalization No Has patient had a PCN reaction occurring within the last 10 years: No If all of the above answers are "NO", then may proceed with Cephalosporin use.   Shellfish-Derived Products Other (See Comments)    From allergy test   Strawberry Extract Other (See Comments)    From allergy test      Physical examination: Blood pressure 110/70, pulse 74, temperature 99.4 F (37.4 C), temperature source Temporal, resp. rate 16, SpO2 98 %.  General: Alert, interactive, in no acute distress. HEENT: PERRLA, TMs pearly gray, turbinates non-edematous without discharge, post-pharynx non erythematous. Neck: Supple without lymphadenopathy. Lungs: Clear to auscultation without wheezing, rhonchi or rales.  {no increased work of breathing. CV: Normal S1, S2 without murmurs. Abdomen: Nondistended, nontender. Skin: Warm and dry, without lesions or rashes. Extremities:  No clubbing, cyanosis or edema. Neuro:   Grossly intact.  Diagnositics/Labs:  Spirometry: FEV1: 1.76L 106%, FVC: 2.41L 111%, ratio consistent with nonobstructive pattern  Assessment and plan: Allergic rhinitis/chronic rhinitis with history of nasal polyposis-clinical control at this time with current regimen Severe persistent asthma-under better control with ICS/LAMA and biologic agent.  Lung function testing looks really great today! LPRD-asymptomatic at this time-antireflux medication Urticaria-has not had any further urticarial episodes   1. Continue Symbicort 2 puffs twice a day for asthma maintenance control  2. Continue Fasenra injections every 8 weeks.     3. Use Ipratropium 0.06% 2 sprays each nostril as needed for nasal drainage  4. Continue Singulair10mg  and Zyrtec 10mg  at this time  5.  If needed:  A.  Ventolin HFA 2 puffs or albuterol nebulization every 4-6 hours  B.  Nasal saline spray to help moisturize nose     6. Return to clinic in 6-12 months or earlier if problem  I appreciate the opportunity to take part in Angelica [name]'s care. Please do not hesitate to contact me with questions.  Sincerely,   Prudy Feeler, MD Allergy/Immunology [facility]

## 2022-08-15 NOTE — Patient Instructions (Addendum)
  Doing very well!   Lung function testing looks really great today!  1. Continue Symbicort 2 puffs twice a day for asthma maintenance control  2. Continue Fasenra injections every 8 weeks.     3. Use Ipratropium 0.06% 2 sprays each nostril as needed for nasal drainage  4. Continue Singulair10mg  and Zyrtec 10mg  at this time  5.  If needed:   A.  Ventolin HFA 2 puffs or albuterol nebulization every 4-6 hours  B.  Nasal saline spray to help moisturize nose     6. Return to clinic in 6-12 months or earlier if problem

## 2022-08-18 ENCOUNTER — Other Ambulatory Visit: Payer: Self-pay | Admitting: Internal Medicine

## 2022-08-22 ENCOUNTER — Ambulatory Visit (INDEPENDENT_AMBULATORY_CARE_PROVIDER_SITE_OTHER): Payer: Medicare HMO

## 2022-08-22 DIAGNOSIS — J4550 Severe persistent asthma, uncomplicated: Secondary | ICD-10-CM | POA: Diagnosis not present

## 2022-09-06 DIAGNOSIS — F0280 Dementia in other diseases classified elsewhere without behavioral disturbance: Secondary | ICD-10-CM | POA: Insufficient documentation

## 2022-09-06 DIAGNOSIS — Z87891 Personal history of nicotine dependence: Secondary | ICD-10-CM | POA: Insufficient documentation

## 2022-09-06 DIAGNOSIS — E039 Hypothyroidism, unspecified: Secondary | ICD-10-CM | POA: Insufficient documentation

## 2022-09-06 DIAGNOSIS — R1031 Right lower quadrant pain: Secondary | ICD-10-CM | POA: Diagnosis not present

## 2022-09-07 ENCOUNTER — Encounter: Payer: Self-pay | Admitting: Internal Medicine

## 2022-09-07 MED ORDER — PANTOPRAZOLE SODIUM 40 MG PO TBEC: 40.0000 mg | DELAYED_RELEASE_TABLET | Freq: Every day | ORAL | 1 refills | Status: DC

## 2022-09-07 NOTE — Telephone Encounter (Signed)
Please advise 

## 2022-09-18 ENCOUNTER — Other Ambulatory Visit: Payer: Self-pay

## 2022-09-18 ENCOUNTER — Inpatient Hospital Stay: Payer: Medicare HMO | Attending: Internal Medicine

## 2022-09-18 DIAGNOSIS — C3432 Malignant neoplasm of lower lobe, left bronchus or lung: Secondary | ICD-10-CM | POA: Diagnosis not present

## 2022-09-18 DIAGNOSIS — Z95828 Presence of other vascular implants and grafts: Secondary | ICD-10-CM

## 2022-09-18 MED ORDER — HEPARIN SOD (PORK) LOCK FLUSH 100 UNIT/ML IV SOLN
500.0000 [IU] | Freq: Once | INTRAVENOUS | Status: AC
  Administered 2022-09-18: 500 [IU]

## 2022-09-18 MED ORDER — SODIUM CHLORIDE 0.9% FLUSH
10.0000 mL | Freq: Once | INTRAVENOUS | Status: AC
  Administered 2022-09-18: 10 mL

## 2022-09-22 ENCOUNTER — Other Ambulatory Visit: Payer: Self-pay | Admitting: Internal Medicine

## 2022-09-22 ENCOUNTER — Other Ambulatory Visit: Payer: Self-pay | Admitting: Allergy

## 2022-09-24 ENCOUNTER — Encounter: Payer: Self-pay | Admitting: Family Medicine

## 2022-09-24 ENCOUNTER — Ambulatory Visit (INDEPENDENT_AMBULATORY_CARE_PROVIDER_SITE_OTHER): Payer: Medicare HMO | Admitting: Family Medicine

## 2022-09-24 ENCOUNTER — Encounter: Payer: Self-pay | Admitting: Internal Medicine

## 2022-09-24 VITALS — BP 132/80 | HR 60 | Temp 97.8°F | Resp 20 | Ht 65.0 in | Wt 129.0 lb

## 2022-09-24 DIAGNOSIS — Z91018 Allergy to other foods: Secondary | ICD-10-CM

## 2022-09-24 DIAGNOSIS — R053 Chronic cough: Secondary | ICD-10-CM | POA: Diagnosis not present

## 2022-09-24 DIAGNOSIS — T50905A Adverse effect of unspecified drugs, medicaments and biological substances, initial encounter: Secondary | ICD-10-CM

## 2022-09-24 MED ORDER — BENZONATATE 100 MG PO CAPS
100.0000 mg | ORAL_CAPSULE | Freq: Every evening | ORAL | 0 refills | Status: DC | PRN
Start: 2022-09-24 — End: 2022-10-05

## 2022-09-24 MED ORDER — EPINEPHRINE 0.3 MG/0.3ML IJ SOAJ: INTRAMUSCULAR | 1 refills | Status: DC

## 2022-09-24 NOTE — Progress Notes (Signed)
Assessment & Plan:  1. Adverse effect of drug, initial encounter Discussed this is a possible reaction with heparin and encouraged her to discuss with her oncologist.  2. Chronic cough - benzonatate (TESSALON) 100 MG capsule; Take 1 capsule (100 mg total) by mouth at bedtime as needed for cough.  Dispense: 30 capsule; Refill: 0  3. Multiple food allergies - EPINEPHrine (EPIPEN 2-PAK) 0.3 mg/0.3 mL IJ SOAJ injection; Use as directed for severe allergic reaction  Dispense: 2 each; Refill: 1   Follow up plan: Return for as scheduled with PCP.  Angelica Boston, MSN, APRN, FNP-C  Subjective:  HPI: Angelica [name] is a 82 y.o. female presenting on 09/24/2022 for Abdominal Pain (Some vomiting and diarrhea - after Port flushed  4/30 with oncology ) and Cough (Only at night occasionally = liquid makes her sick, would like refill of tessalon perles )  Patient is accompanied by her her daughter, who she is okay with being present. Patient and daughter report the patient gets sick to her stomach every time she has her port-a-cath flushed. She experiences vomiting and diarrhea the same day, which then resolves. Port is flushed with heparin and saline. She has Zofran at home, but did not take it this past port flush.   Patient's daughter is requesting cough pills as the patient is unable to tolerate liquid cough syrups without vomiting.   They are also requesting a refill of her Epi-Pen. She has one due to her food allergies to peanuts, shellfish, and strawberries. She has never had to use it, but likes to keep it on hand. The one she currently has is expired.     ROS: Negative unless specifically indicated above in HPI.   Relevant past medical history reviewed and updated as indicated.   Allergies and medications reviewed and updated.   Current Outpatient Medications:    acetaminophen (TYLENOL) 500 MG tablet, Take 500 mg by mouth every 6 (six) hours as needed for moderate pain., Disp: , Rfl:     alum & mag hydroxide-simeth (MAALOX/MYLANTA) 200-200-20 MG/5ML suspension, Take 15 mLs by mouth every 6 (six) hours as needed for indigestion or heartburn., Disp: , Rfl:    amLODipine (NORVASC) 10 MG tablet, Take 1 tablet by mouth once daily, Disp: 90 tablet, Rfl: 0   Benralizumab (FASENRA) 30 MG/ML SOSY, Inject 1 mL (30 mg total) into the skin every 8 (eight) weeks., Disp: 1 mL, Rfl: 6   clopidogrel (PLAVIX) 75 MG tablet, Take 1 tablet by mouth once daily, Disp: 90 tablet, Rfl: 3   diclofenac Sodium (VOLTAREN) 1 % GEL, Apply 4 g topically 4 (four) times daily., Disp: 100 g, Rfl: 0   EQ ALLERGY RELIEF, CETIRIZINE, 10 MG tablet, Take 1 tablet by mouth once daily, Disp: 90 tablet, Rfl: 0   fluticasone (FLONASE) 50 MCG/ACT nasal spray, Place 1 spray into both nostrils in the morning and at bedtime., Disp: 16 g, Rfl: 5   ipratropium (ATROVENT) 0.06 % nasal spray, Place 2 sprays into both nostrils 4 (four) times daily as needed (nasal drainage)., Disp: 15 mL, Rfl: 5   linaclotide (LINZESS) 72 MCG capsule, TAKE 1 CAPSULE BY MOUTH ONCE DAILY AS NEEDED, Disp: 30 capsule, Rfl: 0   Magnesium Hydroxide (PHILLIPS MILK OF MAGNESIA PO), Take 15-30 mLs by mouth daily as needed (for constipation)., Disp: , Rfl:    memantine (NAMENDA) 10 MG tablet, Take 1 tablet (10 mg total) by mouth 2 (two) times daily., Disp: 60 tablet, Rfl: 5  ondansetron (ZOFRAN-ODT) 4 MG disintegrating tablet, Take 1 tablet (4 mg total) by mouth every 8 (eight) hours as needed for nausea or vomiting., Disp: 10 tablet, Rfl: 0   pantoprazole (PROTONIX) 40 MG tablet, Take 1 tablet (40 mg total) by mouth daily., Disp: 90 tablet, Rfl: 1   polyethylene glycol (MIRALAX / GLYCOLAX) 17 g packet, Take 17 g by mouth daily as needed for mild constipation., Disp: , Rfl:    pravastatin (PRAVACHOL) 80 MG tablet, Take 1 tablet by mouth once daily, Disp: 90 tablet, Rfl: 0   SYMBICORT 80-4.5 MCG/ACT inhaler, INHALE 2 PUFFS BY MOUTH IN THE MORNING AND AT  BEDTIME, Disp: 10.2 g, Rfl: 5   EPINEPHrine (EPIPEN 2-PAK) 0.3 mg/0.3 mL IJ SOAJ injection, Use as directed for severe allergic reaction (Patient not taking: Reported on 07/09/2022), Disp: 2 each, Rfl: 1  Current Facility-Administered Medications:    Benralizumab SOSY 30 mg, 30 mg, Subcutaneous, Q8 Weeks, Padgett, Pilar Grammes, MD, 30 mg at 08/22/22 1420  Allergies  Allergen Reactions   Asa Buff (Mag [Buffered Aspirin] Other (See Comments)    Excessive sweating   Ensure [Nutritional Supplements]     Or boost - upset stomach    Fish Allergy Other (See Comments)    Unknown reaction, but allergic   Other Other (See Comments)    Gelcaps; gel-containing capsules; extended-release medication - upset stomach    Peanut-Containing Drug Products Other (See Comments)    From allergy test   Penicillins Itching    Has patient had a PCN reaction causing immediate rash, facial/tongue/throat swelling, SOB or lightheadedness with hypotension: No Has patient had a PCN reaction causing severe rash involving mucus membranes or skin necrosis: No Has patient had a PCN reaction that required hospitalization No Has patient had a PCN reaction occurring within the last 10 years: No If all of the above answers are "NO", then may proceed with Cephalosporin use.   Shellfish-Derived Products Other (See Comments)    From allergy test   Strawberry Extract Other (See Comments)    From allergy test     Objective:   BP 132/80   Pulse 60   Temp 97.8 F (36.6 C)   Resp 20   Ht 5\' 5"  (1.651 m)   Wt 129 lb (58.5 kg)   BMI 21.47 kg/m    Physical Exam Vitals reviewed.  Constitutional:      General: She is not in acute distress.    Appearance: Normal appearance. She is not ill-appearing, toxic-appearing or diaphoretic.  HENT:     Head: Normocephalic and atraumatic.  Eyes:     General: No scleral icterus.       Right eye: No discharge.        Left eye: No discharge.     Conjunctiva/sclera: Conjunctivae  normal.  Cardiovascular:     Rate and Rhythm: Normal rate.  Pulmonary:     Effort: Pulmonary effort is normal. No respiratory distress.  Musculoskeletal:        General: Normal range of motion.     Cervical back: Normal range of motion.  Skin:    General: Skin is warm and dry.     Capillary Refill: Capillary refill takes less than 2 seconds.  Neurological:     General: No focal deficit present.     Mental Status: She is alert and oriented to person, place, and time. Mental status is at baseline.  Psychiatric:        Mood and Affect: Mood normal.  Behavior: Behavior normal.        Thought Content: Thought content normal.        Judgment: Judgment normal.

## 2022-09-25 ENCOUNTER — Ambulatory Visit: Payer: Medicare HMO | Admitting: Podiatry

## 2022-09-25 DIAGNOSIS — M79674 Pain in right toe(s): Secondary | ICD-10-CM | POA: Diagnosis not present

## 2022-09-25 DIAGNOSIS — B351 Tinea unguium: Secondary | ICD-10-CM

## 2022-09-25 DIAGNOSIS — M79675 Pain in left toe(s): Secondary | ICD-10-CM

## 2022-09-25 NOTE — Progress Notes (Signed)
       Subjective:  Patient ID: Angelica [name], female    DOB: 1941/05/05,  MRN: [id number]  Angelica [name] presents to clinic today for:  Chief Complaint  Patient presents with   nail trim  . Patient notes nails are thick, discolored, elongated and painful in shoegear when trying to ambulate.    PCP is [name], MD.  Last seen 07/09/22  Allergies  Allergen Reactions   Juanetta Snow (Mag [Buffered Aspirin] Other (See Comments)    Excessive sweating   Ensure [Nutritional Supplements]     Or boost - upset stomach    Fish Allergy Other (See Comments)    Unknown reaction, but allergic   Other Other (See Comments)    Gelcaps; gel-containing capsules; extended-release medication - upset stomach    Peanut-Containing Drug Products Other (See Comments)    From allergy test   Penicillins Itching    Has patient had a PCN reaction causing immediate rash, facial/tongue/throat swelling, SOB or lightheadedness with hypotension: No Has patient had a PCN reaction causing severe rash involving mucus membranes or skin necrosis: No Has patient had a PCN reaction that required hospitalization No Has patient had a PCN reaction occurring within the last 10 years: No If all of the above answers are "NO", then may proceed with Cephalosporin use.   Shellfish-Derived Products Other (See Comments)    From allergy test   Strawberry Extract Other (See Comments)    From allergy test     Review of Systems: Negative except as noted in the HPI.   Angelica [name] is a pleasant 82 y.o. female in NAD. AAO x 3.  Vascular Examination: Capillary refill time is 3-5 seconds to toes bilateral. Palpable pedal pulses b/l LE. Digital hair present b/l. No pedal edema b/l. Skin temperature gradient WNL b/l. No varicosities b/l. No cyanosis or clubbing noted b/l.   Dermatological Examination: Pedal skin with normal turgor, texture and tone b/l. No open wounds. No interdigital macerations b/l. Toenails 1-5 b/l  thickened 3mm, discolored, dystrophic with subungual debris. There is pain with compression of the nail plates.  They are elongated x10  Neurological Examination: Protective sensation intact with Semmes-Weinstein 10 gram monofilament b/l LE. Vibratory sensation intact b/l LE.  Musculoskeletal Examination: Muscle strength 5/5 to all LE muscle groups b/l.   Assessment/Plan: 1. Dermatophytosis of nail   2. Pain in toes of both feet     Mycotic nails x 10 were debrided with sterile nail nippers and a power debriding bur.   Return in about 3 months (around 12/26/2022) for RFC.   Clerance Lav, DPM, FACFAS [facility]     [address] [place], [place] [address]                Office [phone]  Fax [phone]

## 2022-09-26 ENCOUNTER — Other Ambulatory Visit: Payer: Self-pay

## 2022-09-27 ENCOUNTER — Ambulatory Visit: Payer: Medicare HMO | Admitting: Family Medicine

## 2022-10-01 ENCOUNTER — Ambulatory Visit (INDEPENDENT_AMBULATORY_CARE_PROVIDER_SITE_OTHER): Payer: Medicare HMO | Admitting: Neurology

## 2022-10-01 ENCOUNTER — Encounter: Payer: Self-pay | Admitting: Internal Medicine

## 2022-10-01 ENCOUNTER — Ambulatory Visit: Payer: Medicare HMO | Admitting: Neurology

## 2022-10-01 ENCOUNTER — Encounter: Payer: Self-pay | Admitting: Neurology

## 2022-10-01 VITALS — BP 134/86 | HR 87 | Ht 62.0 in | Wt 136.0 lb

## 2022-10-01 DIAGNOSIS — R9389 Abnormal findings on diagnostic imaging of other specified body structures: Secondary | ICD-10-CM | POA: Diagnosis not present

## 2022-10-01 DIAGNOSIS — G309 Alzheimer's disease, unspecified: Secondary | ICD-10-CM | POA: Diagnosis not present

## 2022-10-01 DIAGNOSIS — F0280 Dementia in other diseases classified elsewhere without behavioral disturbance: Secondary | ICD-10-CM

## 2022-10-01 NOTE — Patient Instructions (Signed)
I had a long discussion with the patient and her daughter regarding her memory loss and cognitive impairment which appears to have progressed slightly and testing in the office today.  I recommend she continue Namenda to 10 mg twice daily  Continue participation in cognitively challenging activities like solving crossword puzzles, playing bridge and sudoku.  We also discussed memory compensation.  She will return for follow-up in the future in 6 months with my nurse practitioner or call earlier if necessary.  Memory Compensation Strategies  Use "WARM" strategy.  W= write it down  A= associate it  R= repeat it  M= make a mental note  2.   You can keep a Glass blower/designer.  Use a 3-ring notebook with sections for the following: calendar, important names and phone numbers,  medications, doctors' names/phone numbers, lists/reminders, and a section to journal what you did  each day.   3.    Use a calendar to write appointments down.  4.    Write yourself a schedule for the day.  This can be placed on the calendar or in a separate section of the Memory Notebook.  Keeping a  regular schedule can help memory.  5.    Use medication organizer with sections for each day or morning/evening pills.  You may need help loading it  6.    Keep a basket, or pegboard by the door.  Place items that you need to take out with you in the basket or on the pegboard.  You may also want to  include a message board for reminders.  7.    Use sticky notes.  Place sticky notes with reminders in a place where the task is performed.  For example: " turn off the  stove" placed by the stove, "lock the door" placed on the door at eye level, " take your medications" on  the bathroom mirror or by the place where you normally take your medications.  8.    Use alarms/timers.  Use while cooking to remind yourself to check on food or as a reminder to take your medicine, or as a  reminder to make a call, or as a reminder to perform  another task, etc.

## 2022-10-01 NOTE — Progress Notes (Signed)
[facility] [address] [place] [place] [address] [phone]       OFFICE FOLLOW-UP VISIT NOTE  Angelica [name] Date of Birth:  August 23, 1940 Medical Record Number:  [id number]   Referring MD:  Cathlean Cower  Reason for Referral:  Stroke and memory loss  HPI: Initial visit 3/25 2023 Angelica [name] is a pleasant 82 year old African-American lady with initial office consultation visit.  She is accompanied by 2 of her daughters.  History is obtained from them and review of electronic medical records and I have personally reviewed pertinent available imaging films in PACS.  She is a 82 year old African-American lady with past medical history of known lung cancer s/p chemotherapy, radiation.  Acid reflux, hypertension and memory loss.  Patient has had for the last 6 to 9 months progressive decline in memory and cognition.  Her short-term memory is quite poor though long-term he is fine.  He needs constant reminders and repetition for anything-year-old.  She does not do well with change.  She lives at home with her daughter and is mostly independent in all activities of daily living.  Her daughter is not in charge of her primary and arranging her medicines.  She has not exhibited any unsafe behaviors except a while back she left the faucet on and with the whole bathroom.  She denies any, delusions, hallucinations, agitation.  She does not wander off.  She has no prior history of significant head injury, loss of consciousness, seizures, stroke or TIA.  She and her daughter specifically denies any history of slurred speech, aphasia, vertigo, diplopia, focal extremity weakness, gait imbalance.  She had an MRI scan of the brain done on 07/18/2021 which I personally reviewed and shows only mild changes of small vessel disease and generalized atrophy.  I do not see a definite right cerebellar stroke but the radiologist has called 1 which apparently was not seen on previous MRI from 06/14/2020.   Patient has since been started on Plavix by Dr. Jenny Reichmann and is tolerating it well without bruising or bleeding.  Blood pressure is usually better controlled today it is elevated at 156/82.  Lab work on 07/18/2021 showed LDL cholesterol 97 mg percent, hemoglobin A1c 5.8.  Vitamin B12, vitamin D and free T4 levels are all normal.  She has not yet had an EEG.  She was started on Namenda starter pack presently taking 5 mg once a day.  Daughters are concerned about her gastric reflux and did not want to try Aricept.  On Mini-Mental status exam today she scored 18/30 and clock drawing 3/4.  On 07/20/2021 she scored 11/30 on MoCA testing with our urology office Family history of Alzheimer's in her father.  Update 04/17/2022 : She returns for follow-up after last visit 8 months ago.  She is accompanied by her daughter.  They feel her memory difficulties are more or less unchanged however on objective testing she has shown some decline on cognitive testing in the office today and Mini-Mental exam score was 15/30 compared to 18/30 at last visit.  She denies any delusions, hallucinations, agitation or unsafe behavior..  She is bothered by left hip pain and has started physical therapy for that.  Her cancer is stable and she has an upcoming visit to see Dr. [name] next month.  He is on Namenda 5 mg twice daily which is tolerating well without side effects but for unclear reasons she has never tried a higher dose.  She had EEG done on 08/08/2021 which  was normal.  CT angiogram on 08/10/2021 showed mild tortuosity of cervical carotid with no large vessel stenosis or occlusion.  She has no new complaints today. Update 10/01/2022 : She returns for follow-up after last visit 6 months ago.  She is accompanied by her daughter.  Patient is still living with the daughter.  Continues to have short-term memory difficulties and poor recall of recent events.  She this is still mostly independent in activities like bathing, cleaning, dressing  herself and feeding herself.  The infection is improved breakfast.  She has not been driving.  She has not exhibited any unsafe behavior, agitation, delusions or hallucinations.  She does not wander away or get lost.  She has tolerated increasing the dose of Namenda to 10 mg twice daily at last visit but daughter has not noticed any improvement with the increased dose but she has not noticed any worsening either.  On mental status exam testing today she scored 14/30 which is not significantly different from.  15/30 at last visit ROS:   14 system review of systems is positive for memory loss, confusion and all other systems negative  PMH:  Past Medical History:  Diagnosis Date   Arthritis    Asthma    Bowel obstruction (HCC)    Cancer (HCC)    Environmental allergies    GERD (gastroesophageal reflux disease)    Glaucoma 05/09/2017   H. pylori infection    History of radiation therapy 07/05/20-08/05/20   Left lung IMRT Dr. Roselind Messier    HLD (hyperlipidemia) 01/16/2019   Hypertension    Iron deficiency anemia    Osteopenia 05/26/2017   Thyroid disease    Urticaria 07/25/2018    Social History:  Social History   Socioeconomic History   Marital status: Widowed    Spouse name: Not on file   Number of children: 4   Years of education: 19   Highest education level: Not on file  Occupational History   Occupation: retired  Tobacco Use   Smoking status: Former    Types: Cigarettes   Smokeless tobacco: Never   Tobacco comments:    quit 60 years ago  Vaping Use   Vaping Use: Never used  Substance and Sexual Activity   Alcohol use: No   Drug use: No   Sexual activity: Never  Other Topics Concern   Not on file  Social History Narrative   Right handed   Caffeine on occ   One story home   Social Determinants of Health   Financial Resource Strain: Low Risk  (04/25/2021)   Overall Financial Resource Strain (CARDIA)    Difficulty of Paying Living Expenses: Not hard at all  Food  Insecurity: No Food Insecurity (11/16/2021)   Hunger Vital Sign    Worried About Running Out of Food in the Last Year: Never true    Ran Out of Food in the Last Year: Never true  Transportation Needs: No Transportation Needs (11/16/2021)   PRAPARE - Administrator, Civil Service (Medical): No    Lack of Transportation (Non-Medical): No  Physical Activity: Unknown (04/25/2021)   Exercise Vital Sign    Days of Exercise per Week: 0 days    Minutes of Exercise per Session: Not on file  Recent Concern: Physical Activity - Inactive (04/25/2021)   Exercise Vital Sign    Days of Exercise per Week: 0 days    Minutes of Exercise per Session: 30 min  Stress: No Stress Concern Present (04/25/2021)  [employer] of Occupational Health - Occupational Stress Questionnaire    Feeling of Stress : Not at all  Social Connections: Moderately Isolated (04/25/2021)   Social Connection and Isolation Panel [NHANES]    Frequency of Communication with Friends and Family: More than three times a week    Frequency of Social Gatherings with Friends and Family: Twice a week    Attends Religious Services: More than 4 times per year    Active Member of [employer] or Organizations: No    Attends Banker Meetings: Not on file    Marital Status: Widowed  Intimate Partner Violence: Not At Risk (02/27/2021)   Humiliation, Afraid, Rape, and Kick questionnaire    Fear of Current or Ex-Partner: No    Emotionally Abused: No    Physically Abused: No    Sexually Abused: No    Medications:   Current Outpatient Medications on File Prior to Visit  Medication Sig Dispense Refill   acetaminophen (TYLENOL) 500 MG tablet Take 500 mg by mouth every 6 (six) hours as needed for moderate pain.     alum & mag hydroxide-simeth (MAALOX/MYLANTA) 200-200-20 MG/5ML suspension Take 15 mLs by mouth every 6 (six) hours as needed for indigestion or heartburn.     amLODipine (NORVASC) 10 MG tablet Take 1 tablet by mouth  once daily 90 tablet 0   Benralizumab (FASENRA) 30 MG/ML SOSY Inject 1 mL (30 mg total) into the skin every 8 (eight) weeks. 1 mL 6   benzonatate (TESSALON) 100 MG capsule Take 1 capsule (100 mg total) by mouth at bedtime as needed for cough. 30 capsule 0   clopidogrel (PLAVIX) 75 MG tablet Take 1 tablet by mouth once daily 90 tablet 3   diclofenac Sodium (VOLTAREN) 1 % GEL Apply 4 g topically 4 (four) times daily. 100 g 0   EPINEPHrine (EPIPEN 2-PAK) 0.3 mg/0.3 mL IJ SOAJ injection Use as directed for severe allergic reaction 2 each 1   EQ ALLERGY RELIEF, CETIRIZINE, 10 MG tablet Take 1 tablet by mouth once daily 90 tablet 0   fluticasone (FLONASE) 50 MCG/ACT nasal spray Place 1 spray into both nostrils in the morning and at bedtime. 16 g 5   ipratropium (ATROVENT) 0.06 % nasal spray Place 2 sprays into both nostrils 4 (four) times daily as needed (nasal drainage). 15 mL 5   Magnesium Hydroxide (PHILLIPS MILK OF MAGNESIA PO) Take 15-30 mLs by mouth daily as needed (for constipation).     memantine (NAMENDA) 10 MG tablet Take 1 tablet (10 mg total) by mouth 2 (two) times daily. 60 tablet 5   ondansetron (ZOFRAN-ODT) 4 MG disintegrating tablet Take 1 tablet (4 mg total) by mouth every 8 (eight) hours as needed for nausea or vomiting. 10 tablet 0   pantoprazole (PROTONIX) 40 MG tablet Take 1 tablet (40 mg total) by mouth daily. 90 tablet 1   polyethylene glycol (MIRALAX / GLYCOLAX) 17 g packet Take 17 g by mouth daily as needed for mild constipation.     pravastatin (PRAVACHOL) 80 MG tablet Take 1 tablet by mouth once daily 90 tablet 0   SYMBICORT 80-4.5 MCG/ACT inhaler INHALE 2 PUFFS BY MOUTH IN THE MORNING AND AT BEDTIME 10.2 g 5   linaclotide (LINZESS) 72 MCG capsule TAKE 1 CAPSULE BY MOUTH ONCE DAILY AS NEEDED (Patient not taking: Reported on 10/01/2022) 30 capsule 0   Current Facility-Administered Medications on File Prior to Visit  Medication Dose Route Frequency Provider Last Rate Last Admin  Benralizumab SOSY 30 mg  30 mg Subcutaneous Q8 Weeks Marcelyn Bruins, MD   30 mg at 08/22/22 1420    Allergies:   Allergies  Allergen Reactions   Asa Buff (Mag [Buffered Aspirin] Other (See Comments)    Excessive sweating   Ensure [Nutritional Supplements]     Or boost - upset stomach    Fish Allergy Other (See Comments)    Unknown reaction, but allergic   Other Other (See Comments)    Gelcaps; gel-containing capsules; extended-release medication - upset stomach    Peanut-Containing Drug Products Other (See Comments)    From allergy test   Penicillins Itching    Has patient had a PCN reaction causing immediate rash, facial/tongue/throat swelling, SOB or lightheadedness with hypotension: No Has patient had a PCN reaction causing severe rash involving mucus membranes or skin necrosis: No Has patient had a PCN reaction that required hospitalization No Has patient had a PCN reaction occurring within the last 10 years: No If all of the above answers are "NO", then may proceed with Cephalosporin use.   Shellfish-Derived Products Other (See Comments)    From allergy test   Strawberry Extract Other (See Comments)    From allergy test     Physical Exam General: Frail elderly African-American lady seated, in no evident distress Head: head normocephalic and atraumatic.   Neck: supple with no carotid or supraclavicular bruits Cardiovascular: regular rate and rhythm, no murmurs Musculoskeletal: no deformity Skin:  no rash/petichiae Vascular:  Normal pulses all extremities  Neurologic Exam Mental Status: Awake and fully alert. Oriented to place and time. Recent and remote memory  poor. Attention span, concentration and fund of knowledge diminished. Mood and affect appropriate.  Mini-Mental status exam scored  14/30 ( last visit 15/30.)  Clock drawing 3/4.  Able to name 8 animals that can walk on 4 legs Cranial Nerves: Fundoscopic exam reveals sharp disc margins. Pupils equal,  briskly reactive to light. Extraocular movements full without nystagmus. Visual fields full to confrontation. Hearing intact. Facial sensation intact. Face, tongue, palate moves normally and symmetrically.  Motor: Normal bulk and tone. Normal strength in all tested extremity muscles. Sensory.: intact to touch , pinprick , position and vibratory sensation.  Coordination: Rapid alternating movements normal in all extremities. Finger-to-nose and heel-to-shin performed accurately bilaterally. Gait and Station: Arises from chair without difficulty. Stance is normal. Gait demonstrates favoring of the right knee due to pain Unable to heel, toe and tandem walk without difficulty.  Reflexes: 1+ and symmetric. Toes downgoing.       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  Abnormal report of MRI of the brain raising concerns of cerebellar stroke but not  convincing by my assessment and patient has no clinical history of TIA or stroke.  Vascular risk factors of age, mild hyperlipidemia and recent lung cancer.  She has shown some decline in MMSE exam today compared to last visit    PLAN:I had a long discussion with the patient and her daughter regarding her memory loss and cognitive impairment which appears to have progressed slightly and testing in the office today.  I recommend she continue Namenda  to 10 mg twice daily  Continue participation in cognitively challenging activities like solving crossword puzzles, playing bridge and sudoku.  We also discussed memory compensation.  She will return for follow-up in the future in 6 months with my nurse practitioner or call earlier if necessary.. Greater than 50% time during this 35 week minute visit with spent on counseling and coordination of care about her cognitive impairment, dementia Delia Heady, MD  Note: This document was prepared with digital dictation and possible smart phrase technology. Any transcriptional errors that result from this process are unintentional.

## 2022-10-02 ENCOUNTER — Other Ambulatory Visit: Payer: Self-pay | Admitting: Medical Oncology

## 2022-10-02 ENCOUNTER — Emergency Department (HOSPITAL_COMMUNITY): Payer: Medicare HMO

## 2022-10-02 ENCOUNTER — Encounter (HOSPITAL_COMMUNITY): Payer: Self-pay

## 2022-10-02 ENCOUNTER — Emergency Department (HOSPITAL_COMMUNITY)
Admission: EM | Admit: 2022-10-02 | Discharge: 2022-10-02 | Disposition: A | Discharge status: Home / Self Care | Payer: Medicare HMO | Attending: Emergency Medicine | Admitting: Emergency Medicine

## 2022-10-02 DIAGNOSIS — R531 Weakness: Secondary | ICD-10-CM | POA: Insufficient documentation

## 2022-10-02 DIAGNOSIS — R946 Abnormal results of thyroid function studies: Secondary | ICD-10-CM | POA: Insufficient documentation

## 2022-10-02 DIAGNOSIS — Z859 Personal history of malignant neoplasm, unspecified: Secondary | ICD-10-CM | POA: Insufficient documentation

## 2022-10-02 DIAGNOSIS — R5383 Other fatigue: Secondary | ICD-10-CM | POA: Diagnosis not present

## 2022-10-02 DIAGNOSIS — Z87891 Personal history of nicotine dependence: Secondary | ICD-10-CM | POA: Diagnosis not present

## 2022-10-02 DIAGNOSIS — Z79899 Other long term (current) drug therapy: Secondary | ICD-10-CM | POA: Insufficient documentation

## 2022-10-02 DIAGNOSIS — Z91010 Allergy to peanuts: Secondary | ICD-10-CM | POA: Insufficient documentation

## 2022-10-02 DIAGNOSIS — R110 Nausea: Secondary | ICD-10-CM | POA: Diagnosis not present

## 2022-10-02 DIAGNOSIS — Z7902 Long term (current) use of antithrombotics/antiplatelets: Secondary | ICD-10-CM | POA: Insufficient documentation

## 2022-10-02 DIAGNOSIS — I10 Essential (primary) hypertension: Secondary | ICD-10-CM | POA: Insufficient documentation

## 2022-10-02 DIAGNOSIS — R7989 Other specified abnormal findings of blood chemistry: Secondary | ICD-10-CM

## 2022-10-02 DIAGNOSIS — Z95828 Presence of other vascular implants and grafts: Secondary | ICD-10-CM

## 2022-10-02 DIAGNOSIS — J45909 Unspecified asthma, uncomplicated: Secondary | ICD-10-CM | POA: Diagnosis not present

## 2022-10-02 LAB — CBC WITH DIFFERENTIAL/PLATELET
Abs Immature Granulocytes: 0.02 10*3/uL (ref 0.00–0.07)
Basophils Absolute: 0 10*3/uL (ref 0.0–0.1)
Basophils Relative: 0 %
Eosinophils Absolute: 0 10*3/uL (ref 0.0–0.5)
Eosinophils Relative: 0 %
HCT: 34.4 % — ABNORMAL LOW (ref 36.0–46.0)
Hemoglobin: 11 g/dL — ABNORMAL LOW (ref 12.0–15.0)
Immature Granulocytes: 0 %
Lymphocytes Relative: 17 %
Lymphs Abs: 1.1 10*3/uL (ref 0.7–4.0)
MCH: 27.2 pg (ref 26.0–34.0)
MCHC: 32 g/dL (ref 30.0–36.0)
MCV: 84.9 fL (ref 80.0–100.0)
Monocytes Absolute: 0.6 10*3/uL (ref 0.1–1.0)
Monocytes Relative: 10 %
Neutro Abs: 4.7 10*3/uL (ref 1.7–7.7)
Neutrophils Relative %: 73 %
Platelets: 243 10*3/uL (ref 150–400)
RBC: 4.05 MIL/uL (ref 3.87–5.11)
RDW: 17 % — ABNORMAL HIGH (ref 11.5–15.5)
WBC: 6.5 10*3/uL (ref 4.0–10.5)
nRBC: 0 % (ref 0.0–0.2)

## 2022-10-02 LAB — COMPREHENSIVE METABOLIC PANEL
ALT: 10 U/L (ref 0–44)
AST: 27 U/L (ref 15–41)
Albumin: 3 g/dL — ABNORMAL LOW (ref 3.5–5.0)
Alkaline Phosphatase: 65 U/L (ref 38–126)
Anion gap: 8 (ref 5–15)
BUN: 11 mg/dL (ref 8–23)
CO2: 22 mmol/L (ref 22–32)
Calcium: 8.6 mg/dL — ABNORMAL LOW (ref 8.9–10.3)
Chloride: 104 mmol/L (ref 98–111)
Creatinine, Ser: 0.81 mg/dL (ref 0.44–1.00)
GFR, Estimated: >60 mL/min (ref >60)
Glucose, Bld: 119 mg/dL — ABNORMAL HIGH (ref 70–99)
Potassium: 4.1 mmol/L (ref 3.5–5.1)
Sodium: 134 mmol/L — ABNORMAL LOW (ref 135–145)
Total Bilirubin: 1.1 mg/dL (ref 0.3–1.2)
Total Protein: 7.5 g/dL (ref 6.5–8.1)

## 2022-10-02 LAB — TROPONIN I (HIGH SENSITIVITY): Troponin I (High Sensitivity): 3 ng/L (ref <18)

## 2022-10-02 LAB — URINALYSIS, W/ REFLEX TO CULTURE (INFECTION SUSPECTED)
Bacteria, UA: NONE SEEN
Bilirubin Urine: NEGATIVE
Glucose, UA: NEGATIVE mg/dL
Hgb urine dipstick: NEGATIVE
Ketones, ur: NEGATIVE mg/dL
Leukocytes,Ua: NEGATIVE
Nitrite: NEGATIVE
Protein, ur: NEGATIVE mg/dL
Specific Gravity, Urine: 1.002 — ABNORMAL LOW (ref 1.005–1.030)
pH: 6 (ref 5.0–8.0)

## 2022-10-02 LAB — T4, FREE: Free T4: 0.85 ng/dL (ref 0.61–1.12)

## 2022-10-02 LAB — LIPASE, BLOOD: Lipase: 35 U/L (ref 11–51)

## 2022-10-02 LAB — TSH: TSH: 7.677 u[IU]/mL — ABNORMAL HIGH (ref 0.350–4.500)

## 2022-10-02 MED ORDER — SODIUM CHLORIDE 0.9 % IV BOLUS
1000.0000 mL | Freq: Once | INTRAVENOUS | Status: AC
  Administered 2022-10-02: 1000 mL via INTRAVENOUS

## 2022-10-02 NOTE — ED Notes (Signed)
Pt was able to ambulate to restroom with no assistant. 

## 2022-10-02 NOTE — ED Provider Notes (Signed)
[place] EMERGENCY DEPARTMENT AT [facility] Provider Note   CSN: [id number] Arrival date & time: 10/02/22  0830     History  Chief Complaint  Patient presents with   Weakness    Angelica [name] is a 82 y.o. female.  82 yo F with a chief complaints of not feeling well.  She said this started on the way home from a trip yesterday.  She has trouble describing it.  Tells me that she just feels weak all over.  She feels a little bit dizzy when she tries to get up and move around.  She denies headache or neck pain denies chest pain denies difficulty breathing denies abdominal pain.  She then recants and says that she has had some recurrent epigastric discomfort that think is reflux.  She denies cough congestion or fever.  Denies nausea vomiting or diarrhea.  Feels like she has been eating and drinking normally.  Denies urinary symptoms.  Denies rash.   Weakness      Home Medications Prior to Admission medications   Medication Sig Start Date End Date Taking? Authorizing Provider  acetaminophen (TYLENOL) 500 MG tablet Take 500 mg by mouth every 6 (six) hours as needed for moderate pain.    [provider]  alum & mag hydroxide-simeth (MAALOX/MYLANTA) 200-200-20 MG/5ML suspension Take 15 mLs by mouth every 6 (six) hours as needed for indigestion or heartburn.    [provider]  amLODipine (NORVASC) 10 MG tablet Take 1 tablet by mouth once daily 08/20/22   [name], MD  Benralizumab [facility]) 30 MG/ML SOSY Inject 1 mL (30 mg total) into the skin every 8 (eight) weeks. 06/12/22   Alfonse Spruce, MD  benzonatate (TESSALON) 100 MG capsule Take 1 capsule (100 mg total) by mouth at bedtime as needed for cough. 09/24/22   Gwenlyn Fudge, FNP  clopidogrel (PLAVIX) 75 MG tablet Take 1 tablet by mouth once daily 07/05/22   [name], MD  diclofenac Sodium (VOLTAREN) 1 % GEL Apply 4 g topically 4 (four) times daily. 04/23/22   Rodolph Bong, MD  EPINEPHrine  (EPIPEN 2-PAK) 0.3 mg/0.3 mL IJ SOAJ injection Use as directed for severe allergic reaction 09/24/22   Gwenlyn Fudge, FNP  EQ ALLERGY RELIEF, CETIRIZINE, 10 MG tablet Take 1 tablet by mouth once daily 09/24/22   Marcelyn Bruins, MD  fluticasone [facility]) 50 MCG/ACT nasal spray Place 1 spray into both nostrils in the morning and at bedtime. 01/05/22   Marcelyn Bruins, MD  ipratropium (ATROVENT) 0.06 % nasal spray Place 2 sprays into both nostrils 4 (four) times daily as needed (nasal drainage). 01/05/22   Marcelyn Bruins, MD  linaclotide [facility]) 72 MCG capsule TAKE 1 CAPSULE BY MOUTH ONCE DAILY AS NEEDED Patient not taking: Reported on 10/01/2022 06/14/20   [name], MD  Magnesium Hydroxide (PHILLIPS MILK OF MAGNESIA PO) Take 15-30 mLs by mouth daily as needed (for constipation).    [provider]  memantine (NAMENDA) 10 MG tablet Take 1 tablet (10 mg total) by mouth 2 (two) times daily. 04/17/22   Micki Riley, MD  ondansetron (ZOFRAN-ODT) 4 MG disintegrating tablet Take 1 tablet (4 mg total) by mouth every 8 (eight) hours as needed for nausea or vomiting. 07/09/22   Gwenlyn Fudge, FNP  pantoprazole (PROTONIX) 40 MG tablet Take 1 tablet (40 mg total) by mouth daily. 09/07/22   [name], MD  polyethylene glycol [facility] /  GLYCOLAX) 17 g packet Take 17 g by mouth daily as needed for mild constipation.    [provider]  pravastatin (PRAVACHOL) 80 MG tablet Take 1 tablet by mouth once daily 07/23/22   [name], MD  SYMBICORT 80-4.5 MCG/ACT inhaler INHALE 2 PUFFS BY MOUTH IN THE MORNING AND AT BEDTIME 01/05/22   Marcelyn Bruins, MD      Allergies    Asa buff (mag [buffered aspirin], Ensure [nutritional supplements], Fish allergy, Other, Peanut-containing drug products, Penicillins, Shellfish-derived products, and Strawberry extract    Review of Systems   Review of Systems  Neurological:  Positive for weakness.    Physical  Exam Updated Vital Signs BP 139/89 (BP Location: Left Arm)   Pulse 88   Temp 97.7 F (36.5 C) (Oral)   Resp 20   SpO2 97%  Physical Exam Vitals and nursing note reviewed.  Constitutional:      General: She is not in acute distress.    Appearance: She is well-developed. She is not diaphoretic.  HENT:     Head: Normocephalic and atraumatic.  Eyes:     Pupils: Pupils are equal, round, and reactive to light.  Cardiovascular:     Rate and Rhythm: Normal rate and regular rhythm.     Heart sounds: No murmur heard.    No friction rub. No gallop.  Pulmonary:     Effort: Pulmonary effort is normal.     Breath sounds: No wheezing or rales.  Abdominal:     General: There is no distension.     Palpations: Abdomen is soft.     Tenderness: There is no abdominal tenderness.  Musculoskeletal:        General: No tenderness.     Cervical back: Normal range of motion and neck supple.  Skin:    General: Skin is warm and dry.  Neurological:     Mental Status: She is alert and oriented to person, place, and time.     GCS: GCS eye subscore is 4. GCS verbal subscore is 5. GCS motor subscore is 6.     Cranial Nerves: Cranial nerves 2-12 are intact.     Sensory: Sensation is intact.     Motor: Motor function is intact.     Coordination: Coordination is intact.     Comments: With finger-to-nose the patient seems to have some trouble seeing my finger.  She tells me this is normal and she wears glasses but did not bring them here today.  Otherwise benign neurologic exam.  Psychiatric:        Behavior: Behavior normal.     ED Results / Procedures / Treatments   Labs (all labs ordered are listed, but only abnormal results are displayed) Labs Reviewed  CBC WITH DIFFERENTIAL/PLATELET - Abnormal; Notable for the following components:      Result Value   Hemoglobin 11.0 (*)    HCT 34.4 (*)    RDW 17.0 (*)    All other components within normal limits  COMPREHENSIVE METABOLIC PANEL - Abnormal;  Notable for the following components:   Sodium 134 (*)    Glucose, Bld 119 (*)    Calcium 8.6 (*)    Albumin 3.0 (*)    All other components within normal limits  TSH - Abnormal; Notable for the following components:   TSH 7.677 (*)    All other components within normal limits  URINALYSIS, W/ REFLEX TO CULTURE (INFECTION SUSPECTED) - Abnormal; Notable for the following components:  Color, Urine COLORLESS (*)    Specific Gravity, Urine 1.002 (*)    All other components within normal limits  LIPASE, BLOOD  T4, FREE  TROPONIN I (HIGH SENSITIVITY)    EKG EKG Interpretation  Date/Time:  Tuesday Oct 02 2022 09:43:59 EDT Ventricular Rate:  71 PR Interval:  154 QRS Duration: 93 QT Interval:  438 QTC Calculation: 476 R Axis:   -29 Text Interpretation: Sinus rhythm Atrial premature complexes Borderline left axis deviation Abnormal R-wave progression, early transition Baseline wander TECHNICALLY DIFFICULT Otherwise no significant change Confirmed by Melene Plan [phone]) on 10/02/2022 9:54:21 AM  Radiology DG Chest Port 1 View  Result Date: 10/02/2022 CLINICAL DATA:  Fatigue. Nausea and weakness starting yesterday. History of cancer, asthma, hypertension, and smoking. EXAM: PORTABLE CHEST 1 VIEW COMPARISON:  07/10/2022 FINDINGS: Heart size and pulmonary vascularity are normal. Power port type central venous catheter with tip over the mid SVC region. No pneumothorax. Surgical clips in the base of the neck. Lungs are clear and expanded. No pleural effusions. No pneumothorax. Mediastinal contours appear intact. Calcification of the aorta. Degenerative changes in the spine and shoulders. IMPRESSION: No active disease. Electronically Signed   By: Burman Nieves M.D.   On: 10/02/2022 09:55    Procedures Procedures    Medications Ordered in ED Medications  sodium chloride 0.9 % bolus 1,000 mL (1,000 mLs Intravenous New Bag/Given 10/02/22 6045)    ED Course/ Medical Decision Making/ A&P                              Medical Decision Making Amount and/or Complexity of Data Reviewed Labs: ordered. Radiology: ordered. ECG/medicine tests: ordered.   82 yo F with a chief complaints of feeling fatigued.  She has trouble describing this any further.  On my record review the patient has a history of lung cancer, also has a history of dementia.  Seems to have some progressive memory loss over the years.  Just hold onto neurologist yesterday.  Per nursing the patient recently went to [facility] and on the drive back is when she started having symptoms.  She denies any other specific complaints.  Will obtain a laboratory evaluation to assess for her acute fatigue.  She has had recurrent epigastric discomfort that is thought to be reflux.  Will obtain a single troponin LFTs and lipase.  Bolus of IV fluids for possible dehydration.  Chest x-ray and UA to screen for infection.  Reassess.  Chest x-ray independently interpreted by me without focal infiltrate or pneumothorax.  No acute anemia, UA negative for infection on my independent interpretation.  Her TSH is mildly elevated.  No significant electrolyte abnormality.  No significant change to renal function.  She continues to feel well.  I discussed her TSH with her and encouraged her to call her family doctor and discuss perhaps a medication change.  11:24 AM:  I have discussed the diagnosis/risks/treatment options with the patient.  Evaluation and diagnostic testing in the emergency department does not suggest an emergent condition requiring admission or immediate intervention beyond what has been performed at this time.  They will follow up with PCP. We also discussed returning to the ED immediately if new or worsening sx occur. We discussed the sx which are most concerning (e.g., sudden worsening pain, fever, inability to tolerate by mouth) that necessitate immediate return. Medications administered to the patient during their visit  and any new prescriptions provided to  the patient are listed below.  Medications given during this visit Medications  sodium chloride 0.9 % bolus 1,000 mL (1,000 mLs Intravenous New Bag/Given 10/02/22 1610)     The patient appears reasonably screen and/or stabilized for discharge and I doubt any other medical condition or other [facility] requiring further screening, evaluation, or treatment in the ED at this time prior to discharge.          Final Clinical Impression(s) / ED Diagnoses Final diagnoses:  Weakness  TSH elevation    Rx / DC Orders ED Discharge Orders     None         Melene Plan, DO 10/02/22 1124

## 2022-10-02 NOTE — ED Triage Notes (Signed)
C/o nausea and weakness started yesterday. Admits to car ride from [place] yesterday that made her tired. pt came back to room alone Hx CA

## 2022-10-02 NOTE — Progress Notes (Signed)
Order placed for port removal 

## 2022-10-02 NOTE — Discharge Instructions (Signed)
We did not find an obvious cause for your fatigue.  Your thyroid level is a little bit on the low side which sometimes could make people feel a bit tired.  Please discuss this with your family doctor they may want to change her medications.  As we discussed in the room please return for headache confusion chest pain difficulty breathing if you are passing out.

## 2022-10-04 ENCOUNTER — Other Ambulatory Visit: Payer: Self-pay

## 2022-10-04 ENCOUNTER — Encounter: Payer: Self-pay | Admitting: Allergy

## 2022-10-04 ENCOUNTER — Ambulatory Visit: Payer: Medicare HMO | Admitting: Allergy

## 2022-10-04 VITALS — BP 122/80 | HR 93 | Temp 97.9°F | Resp 16

## 2022-10-04 DIAGNOSIS — J4550 Severe persistent asthma, uncomplicated: Secondary | ICD-10-CM | POA: Diagnosis not present

## 2022-10-04 DIAGNOSIS — K219 Gastro-esophageal reflux disease without esophagitis: Secondary | ICD-10-CM | POA: Diagnosis not present

## 2022-10-04 DIAGNOSIS — T783XXD Angioneurotic edema, subsequent encounter: Secondary | ICD-10-CM | POA: Diagnosis not present

## 2022-10-04 DIAGNOSIS — R7989 Other specified abnormal findings of blood chemistry: Secondary | ICD-10-CM | POA: Diagnosis not present

## 2022-10-04 DIAGNOSIS — J310 Chronic rhinitis: Secondary | ICD-10-CM

## 2022-10-04 DIAGNOSIS — J339 Nasal polyp, unspecified: Secondary | ICD-10-CM

## 2022-10-04 MED ORDER — IPRATROPIUM BROMIDE 0.06 % NA SOLN: 2.0000 | Freq: Four times a day (QID) | NASAL | 5 refills | Status: DC | PRN

## 2022-10-04 MED ORDER — SYMBICORT 80-4.5 MCG/ACT IN AERO: INHALATION_SPRAY | RESPIRATORY_TRACT | 5 refills | Status: DC

## 2022-10-04 MED ORDER — MONTELUKAST SODIUM 10 MG PO TABS: 10.0000 mg | ORAL_TABLET | Freq: Every day | ORAL | 5 refills | Status: DC

## 2022-10-04 MED ORDER — ALBUTEROL SULFATE HFA 108 (90 BASE) MCG/ACT IN AERS: 2.0000 | INHALATION_SPRAY | RESPIRATORY_TRACT | 1 refills | Status: DC | PRN

## 2022-10-04 NOTE — Progress Notes (Addendum)
Follow-up Note  RE: Angelica [name] MRN: [id number] DOB: [birth year] Date of Office Visit: 10/04/2022   History of present illness: Angelica [name] is a 82 y.o. female presenting today for swelling.  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  She was last seen in the office on 08/15/2022 by myself.She presents today with her daughter. She states she went out of town to visit other family in [place] for a few days over the weekend and while there she states she had swelling of her cheeks.  She also feels like her arms were a bit puffy.  The swelling has resolved and only lasted about a day or so.  She did not really take any medications for this swelling episode.  There are no pictures provided to see if the swelling she had.  She denies having any rash or hives with the swelling.  Her daughter does state that her thyroid medication was stopped when it was not supposed to be.  She had a TSH done on May 14 that was elevated.  Daughter states she has an appointment with her PCP tomorrow to address this elevated TSH level.  Arlisa is not noting any symptoms related to thyroid dysfunction at this time.  They also do not report any new foods.  She has not had any sick type symptoms.  She does take Zyrtec and Singulair on a daily basis.  She is not reporting any nasal congestion or drainage at this time and thus she has not needed to use her nasal ipratropium.  She continues on Symbicort twice a day as well as Fasenra injections every 8 weeks.  Her asthma has been very controlled.  She denies any albuterol needs.  She has not had any ED or urgent care visits for her asthma nor has she required any systemic steroids.   Review of systems: Review of Systems  Constitutional: Negative.   HENT: Negative.    Eyes: Negative.   Respiratory: Negative.    Cardiovascular: Negative.   Gastrointestinal: Negative.    Musculoskeletal: Negative.   Skin:        See HPI  Allergic/Immunologic: Negative.   Neurological: Negative.      All other systems negative unless noted above in HPI  Past medical/social/surgical/family history have been reviewed and are unchanged unless specifically indicated below.  No changes  Medication List: Current Outpatient Medications  Medication Sig Dispense Refill   acetaminophen (TYLENOL) 500 MG tablet Take 500 mg by mouth every 6 (six) hours as needed for moderate pain.     albuterol (VENTOLIN HFA) 108 (90 Base) MCG/ACT inhaler Inhale 2 puffs into the lungs every 4 (four) hours as needed for wheezing or shortness of breath. 18 g 1   alum & mag hydroxide-simeth (MAALOX/MYLANTA) 200-200-20 MG/5ML suspension Take 15 mLs by mouth every 6 (six) hours as needed for indigestion or heartburn.     amLODipine (NORVASC) 10 MG tablet Take 1 tablet by mouth once daily 90 tablet 0   Benralizumab (FASENRA) 30 MG/ML SOSY Inject 1 mL (30 mg total) into the skin every 8 (eight) weeks. 1 mL 6   benzonatate (TESSALON) 100 MG capsule Take 1 capsule (100 mg total) by mouth at bedtime as needed for cough. 30 capsule 0   clopidogrel (PLAVIX) 75 MG tablet Take 1 tablet by mouth once daily 90 tablet 3   diclofenac Sodium (VOLTAREN) 1 % GEL Apply 4 g  topically 4 (four) times daily. 100 g 0   EPINEPHrine (EPIPEN 2-PAK) 0.3 mg/0.3 mL IJ SOAJ injection Use as directed for severe allergic reaction 2 each 1   EQ ALLERGY RELIEF, CETIRIZINE, 10 MG tablet Take 1 tablet by mouth once daily 90 tablet 0   fluticasone (FLONASE) 50 MCG/ACT nasal spray Place 1 spray into both nostrils in the morning and at bedtime. 16 g 5   linaclotide (LINZESS) 72 MCG capsule TAKE 1 CAPSULE BY MOUTH ONCE DAILY AS NEEDED 30 capsule 0   Magnesium Hydroxide (PHILLIPS MILK OF MAGNESIA PO) Take 15-30 mLs by mouth daily as needed (for constipation).     memantine (NAMENDA) 10 MG tablet Take 1 tablet (10 mg total) by mouth 2 (two)  times daily. 60 tablet 5   montelukast (SINGULAIR) 10 MG tablet Take 1 tablet (10 mg total) by mouth at bedtime. 30 tablet 5   ondansetron (ZOFRAN-ODT) 4 MG disintegrating tablet Take 1 tablet (4 mg total) by mouth every 8 (eight) hours as needed for nausea or vomiting. 10 tablet 0   pantoprazole (PROTONIX) 40 MG tablet Take 1 tablet (40 mg total) by mouth daily. 90 tablet 1   polyethylene glycol (MIRALAX / GLYCOLAX) 17 g packet Take 17 g by mouth daily as needed for mild constipation.     pravastatin (PRAVACHOL) 80 MG tablet Take 1 tablet by mouth once daily 90 tablet 0   ipratropium (ATROVENT) 0.06 % nasal spray Place 2 sprays into both nostrils 4 (four) times daily as needed (nasal drainage). 15 mL 5   SYMBICORT 80-4.5 MCG/ACT inhaler INHALE 2 PUFFS BY MOUTH IN THE MORNING AND AT BEDTIME 10.2 g 5   Current Facility-Administered Medications  Medication Dose Route Frequency Provider Last Rate Last Admin   Benralizumab SOSY 30 mg  30 mg Subcutaneous Q8 Weeks Marcelyn Bruins, MD   30 mg at 08/22/22 1420     Known medication allergies: Allergies  Allergen Reactions   Asa Buff (Mag [Buffered Aspirin] Other (See Comments)    Excessive sweating   Ensure [Nutritional Supplements]     Or boost - upset stomach    Fish Allergy Other (See Comments)    Unknown reaction, but allergic   Other Other (See Comments)    Gelcaps; gel-containing capsules; extended-release medication - upset stomach    Peanut-Containing Drug Products Other (See Comments)    From allergy test   Penicillins Itching    Has patient had a PCN reaction causing immediate rash, facial/tongue/throat swelling, SOB or lightheadedness with hypotension: No Has patient had a PCN reaction causing severe rash involving mucus membranes or skin necrosis: No Has patient had a PCN reaction that required hospitalization No Has patient had a PCN reaction occurring within the last 10 years: No If all of the above answers are "NO",  then may proceed with Cephalosporin use.   Shellfish-Derived Products Other (See Comments)    From allergy test   Strawberry Extract Other (See Comments)    From allergy test      Physical examination: Blood pressure 122/80, pulse 93, temperature 97.9 F (36.6 C), temperature source Temporal, resp. rate 16, SpO2 96 %.  General: Alert, interactive, in no acute distress. HEENT: PERRLA, TMs pearly gray, turbinates non-edematous without discharge, post-pharynx non erythematous, no visible edema of the lips or face. Lungs: Clear to auscultation without wheezing, rhonchi or rales. {no increased work of breathing. CV: Normal S1, S2 without murmurs. Skin: Warm and dry, without lesions or rashes. Extremities:  No clubbing, cyanosis or visible edema.  Diagnositics/Labs: None today  Assessment and plan: Facial swelling -reported episode of facial swelling of the cheeks.  This has resolved at the time of the visit.  There is no preceding incident.  No associated urticaria.  The swelling resolved without intervention.  Advised if swelling does occur that she can increase her antihistamine use.  Advised her to discuss her thyroid levels as I have seen angioedema be associated with other autoimmune issues like thyroid disease.  It is very possible that with correcting her thyroid levels that this will resolve or help prevent future swelling episode. Allergic rhinitis/chronic rhinitis with history of nasal polyposis Severe persistent asthma LPRD Urticaria  1. Continue Symbicort 2 puffs twice a day for asthma maintenance control  2. Continue Fasenra injections every 8 weeks.     3. Use Ipratropium 0.06% 2 sprays each nostril as needed for nasal drainage  4. Continue Singulair 10mg   5. Zyrtec 10mg  daily.  Can take extra dose of Zyrtec if having swelling.    6. Have Dr Jonny Ruiz adjust your thyroid replacement medication  7.  If needed:   A.  Ventolin HFA 2 puffs or albuterol nebulization every  4-6 hours  B.  Nasal saline spray to help moisturize nose     8. Return to clinic in 6-12 months or earlier if problem  I appreciate the opportunity to take part in Poppy's care. Please do not hesitate to contact me with questions.  Sincerely,   Margo Aye, MD Allergy/Immunology [facility]

## 2022-10-04 NOTE — Patient Instructions (Signed)
1. Continue Symbicort 2 puffs twice a day for asthma maintenance control  2. Continue Fasenra injections every 8 weeks.     3. Use Ipratropium 0.06% 2 sprays each nostril as needed for nasal drainage  4. Continue Singulair 10mg   5. Zyrtec 10mg  daily.  Can take extra dose of Zyrtec if having swelling.    6. Have Dr Jonny Ruiz adjust your thyroid replacement medication  7.  If needed:   A.  Ventolin HFA 2 puffs or albuterol nebulization every 4-6 hours  B.  Nasal saline spray to help moisturize nose     8. Return to clinic in 6-12 months or earlier if problem

## 2022-10-05 ENCOUNTER — Emergency Department (HOSPITAL_COMMUNITY): Payer: Medicare HMO

## 2022-10-05 ENCOUNTER — Observation Stay (HOSPITAL_COMMUNITY): Payer: Medicare HMO

## 2022-10-05 ENCOUNTER — Encounter (HOSPITAL_COMMUNITY): Payer: Self-pay

## 2022-10-05 ENCOUNTER — Other Ambulatory Visit: Payer: Self-pay

## 2022-10-05 ENCOUNTER — Ambulatory Visit (INDEPENDENT_AMBULATORY_CARE_PROVIDER_SITE_OTHER): Payer: Medicare HMO | Admitting: Internal Medicine

## 2022-10-05 ENCOUNTER — Inpatient Hospital Stay (HOSPITAL_COMMUNITY)
Admission: EM | Admit: 2022-10-05 | Discharge: 2022-10-09 | DRG: 186 | Disposition: A | Discharge status: Home Health | Payer: Medicare HMO | Source: Ambulatory Visit | Attending: Internal Medicine | Admitting: Internal Medicine

## 2022-10-05 ENCOUNTER — Encounter: Payer: Self-pay | Admitting: Internal Medicine

## 2022-10-05 ENCOUNTER — Emergency Department (HOSPITAL_BASED_OUTPATIENT_CLINIC_OR_DEPARTMENT_OTHER): Payer: Medicare HMO

## 2022-10-05 VITALS — BP 120/78 | HR 85 | Temp 97.8°F | Ht 62.0 in | Wt 140.0 lb

## 2022-10-05 DIAGNOSIS — J90 Pleural effusion, not elsewhere classified: Secondary | ICD-10-CM | POA: Diagnosis present

## 2022-10-05 DIAGNOSIS — E876 Hypokalemia: Secondary | ICD-10-CM | POA: Diagnosis not present

## 2022-10-05 DIAGNOSIS — M8580 Other specified disorders of bone density and structure, unspecified site: Secondary | ICD-10-CM | POA: Diagnosis present

## 2022-10-05 DIAGNOSIS — R221 Localized swelling, mass and lump, neck: Secondary | ICD-10-CM | POA: Diagnosis not present

## 2022-10-05 DIAGNOSIS — E032 Hypothyroidism due to medicaments and other exogenous substances: Secondary | ICD-10-CM | POA: Diagnosis present

## 2022-10-05 DIAGNOSIS — R601 Generalized edema: Secondary | ICD-10-CM

## 2022-10-05 DIAGNOSIS — Z9221 Personal history of antineoplastic chemotherapy: Secondary | ICD-10-CM

## 2022-10-05 DIAGNOSIS — C3491 Malignant neoplasm of unspecified part of right bronchus or lung: Secondary | ICD-10-CM | POA: Diagnosis not present

## 2022-10-05 DIAGNOSIS — Z923 Personal history of irradiation: Secondary | ICD-10-CM | POA: Diagnosis not present

## 2022-10-05 DIAGNOSIS — R609 Edema, unspecified: Secondary | ICD-10-CM

## 2022-10-05 DIAGNOSIS — M7989 Other specified soft tissue disorders: Secondary | ICD-10-CM | POA: Diagnosis not present

## 2022-10-05 DIAGNOSIS — Z0001 Encounter for general adult medical examination with abnormal findings: Secondary | ICD-10-CM | POA: Diagnosis not present

## 2022-10-05 DIAGNOSIS — D509 Iron deficiency anemia, unspecified: Secondary | ICD-10-CM | POA: Diagnosis present

## 2022-10-05 DIAGNOSIS — Z833 Family history of diabetes mellitus: Secondary | ICD-10-CM

## 2022-10-05 DIAGNOSIS — K219 Gastro-esophageal reflux disease without esophagitis: Secondary | ICD-10-CM | POA: Diagnosis present

## 2022-10-05 DIAGNOSIS — Z79899 Other long term (current) drug therapy: Secondary | ICD-10-CM

## 2022-10-05 DIAGNOSIS — J9811 Atelectasis: Secondary | ICD-10-CM | POA: Diagnosis present

## 2022-10-05 DIAGNOSIS — R0902 Hypoxemia: Principal | ICD-10-CM

## 2022-10-05 DIAGNOSIS — E039 Hypothyroidism, unspecified: Secondary | ICD-10-CM

## 2022-10-05 DIAGNOSIS — H409 Unspecified glaucoma: Secondary | ICD-10-CM | POA: Diagnosis present

## 2022-10-05 DIAGNOSIS — J9601 Acute respiratory failure with hypoxia: Secondary | ICD-10-CM | POA: Diagnosis present

## 2022-10-05 DIAGNOSIS — Z79890 Hormone replacement therapy (postmenopausal): Secondary | ICD-10-CM | POA: Diagnosis not present

## 2022-10-05 DIAGNOSIS — Z886 Allergy status to analgesic agent status: Secondary | ICD-10-CM

## 2022-10-05 DIAGNOSIS — Z90710 Acquired absence of both cervix and uterus: Secondary | ICD-10-CM

## 2022-10-05 DIAGNOSIS — Z9102 Food additives allergy status: Secondary | ICD-10-CM

## 2022-10-05 DIAGNOSIS — Z87891 Personal history of nicotine dependence: Secondary | ICD-10-CM

## 2022-10-05 DIAGNOSIS — R600 Localized edema: Secondary | ICD-10-CM

## 2022-10-05 DIAGNOSIS — J3089 Other allergic rhinitis: Secondary | ICD-10-CM | POA: Diagnosis present

## 2022-10-05 DIAGNOSIS — F03A0 Unspecified dementia, mild, without behavioral disturbance, psychotic disturbance, mood disturbance, and anxiety: Secondary | ICD-10-CM

## 2022-10-05 DIAGNOSIS — R9431 Abnormal electrocardiogram [ECG] [EKG]: Secondary | ICD-10-CM | POA: Diagnosis not present

## 2022-10-05 DIAGNOSIS — F0390 Unspecified dementia without behavioral disturbance: Secondary | ICD-10-CM | POA: Diagnosis present

## 2022-10-05 DIAGNOSIS — J810 Acute pulmonary edema: Secondary | ICD-10-CM | POA: Diagnosis not present

## 2022-10-05 DIAGNOSIS — Z7951 Long term (current) use of inhaled steroids: Secondary | ICD-10-CM

## 2022-10-05 DIAGNOSIS — E872 Acidosis, unspecified: Secondary | ICD-10-CM

## 2022-10-05 DIAGNOSIS — D72829 Elevated white blood cell count, unspecified: Secondary | ICD-10-CM | POA: Diagnosis not present

## 2022-10-05 DIAGNOSIS — R109 Unspecified abdominal pain: Secondary | ICD-10-CM | POA: Diagnosis not present

## 2022-10-05 DIAGNOSIS — I10 Essential (primary) hypertension: Secondary | ICD-10-CM | POA: Diagnosis present

## 2022-10-05 DIAGNOSIS — Z79620 Long term (current) use of immunosuppressive biologic: Secondary | ICD-10-CM

## 2022-10-05 DIAGNOSIS — E785 Hyperlipidemia, unspecified: Secondary | ICD-10-CM | POA: Diagnosis present

## 2022-10-05 DIAGNOSIS — Z91018 Allergy to other foods: Secondary | ICD-10-CM

## 2022-10-05 DIAGNOSIS — Z888 Allergy status to other drugs, medicaments and biological substances status: Secondary | ICD-10-CM

## 2022-10-05 DIAGNOSIS — J4550 Severe persistent asthma, uncomplicated: Secondary | ICD-10-CM | POA: Diagnosis present

## 2022-10-05 DIAGNOSIS — C3492 Malignant neoplasm of unspecified part of left bronchus or lung: Secondary | ICD-10-CM | POA: Diagnosis present

## 2022-10-05 DIAGNOSIS — Z7902 Long term (current) use of antithrombotics/antiplatelets: Secondary | ICD-10-CM | POA: Diagnosis not present

## 2022-10-05 DIAGNOSIS — Z83511 Family history of glaucoma: Secondary | ICD-10-CM

## 2022-10-05 DIAGNOSIS — Z91013 Allergy to seafood: Secondary | ICD-10-CM

## 2022-10-05 DIAGNOSIS — Z8249 Family history of ischemic heart disease and other diseases of the circulatory system: Secondary | ICD-10-CM

## 2022-10-05 DIAGNOSIS — R0602 Shortness of breath: Secondary | ICD-10-CM | POA: Diagnosis present

## 2022-10-05 DIAGNOSIS — R531 Weakness: Secondary | ICD-10-CM

## 2022-10-05 DIAGNOSIS — C3490 Malignant neoplasm of unspecified part of unspecified bronchus or lung: Secondary | ICD-10-CM | POA: Diagnosis not present

## 2022-10-05 DIAGNOSIS — Z91010 Allergy to peanuts: Secondary | ICD-10-CM

## 2022-10-05 DIAGNOSIS — E611 Iron deficiency: Secondary | ICD-10-CM | POA: Diagnosis present

## 2022-10-05 DIAGNOSIS — Z880 Allergy status to penicillin: Secondary | ICD-10-CM

## 2022-10-05 DIAGNOSIS — Z8673 Personal history of transient ischemic attack (TIA), and cerebral infarction without residual deficits: Secondary | ICD-10-CM

## 2022-10-05 LAB — CBC WITH DIFFERENTIAL/PLATELET
Abs Immature Granulocytes: 0.02 10*3/uL (ref 0.00–0.07)
Basophils Absolute: 0 10*3/uL (ref 0.0–0.1)
Basophils Relative: 0 %
Eosinophils Absolute: 0 10*3/uL (ref 0.0–0.5)
Eosinophils Relative: 0 %
HCT: 34.9 % — ABNORMAL LOW (ref 36.0–46.0)
Hemoglobin: 11.3 g/dL — ABNORMAL LOW (ref 12.0–15.0)
Immature Granulocytes: 0 %
Lymphocytes Relative: 16 %
Lymphs Abs: 1.3 10*3/uL (ref 0.7–4.0)
MCH: 27.8 pg (ref 26.0–34.0)
MCHC: 32.4 g/dL (ref 30.0–36.0)
MCV: 85.7 fL (ref 80.0–100.0)
Monocytes Absolute: 0.6 10*3/uL (ref 0.1–1.0)
Monocytes Relative: 7 %
Neutro Abs: 6.1 10*3/uL (ref 1.7–7.7)
Neutrophils Relative %: 77 %
Platelets: 248 10*3/uL (ref 150–400)
RBC: 4.07 MIL/uL (ref 3.87–5.11)
RDW: 17 % — ABNORMAL HIGH (ref 11.5–15.5)
WBC: 8 10*3/uL (ref 4.0–10.5)
nRBC: 0 % (ref 0.0–0.2)

## 2022-10-05 LAB — COMPREHENSIVE METABOLIC PANEL
ALT: 12 U/L (ref 0–44)
AST: 15 U/L (ref 15–41)
Albumin: 3 g/dL — ABNORMAL LOW (ref 3.5–5.0)
Alkaline Phosphatase: 65 U/L (ref 38–126)
Anion gap: 8 (ref 5–15)
BUN: 8 mg/dL (ref 8–23)
CO2: 23 mmol/L (ref 22–32)
Calcium: 8.6 mg/dL — ABNORMAL LOW (ref 8.9–10.3)
Chloride: 106 mmol/L (ref 98–111)
Creatinine, Ser: 0.66 mg/dL (ref 0.44–1.00)
GFR, Estimated: >60 mL/min (ref >60)
Glucose, Bld: 94 mg/dL (ref 70–99)
Potassium: 3.7 mmol/L (ref 3.5–5.1)
Sodium: 137 mmol/L (ref 135–145)
Total Bilirubin: 0.7 mg/dL (ref 0.3–1.2)
Total Protein: 7 g/dL (ref 6.5–8.1)

## 2022-10-05 LAB — LACTIC ACID, PLASMA
Lactic Acid, Venous: 2.7 mmol/L (ref 0.5–1.9)
Lactic Acid, Venous: 1.4 mmol/L (ref 0.5–1.9)

## 2022-10-05 LAB — LIPASE, BLOOD: Lipase: 35 U/L (ref 11–51)

## 2022-10-05 LAB — TROPONIN I (HIGH SENSITIVITY)
Troponin I (High Sensitivity): 3 ng/L (ref <18)
Troponin I (High Sensitivity): 3 ng/L (ref <18)

## 2022-10-05 LAB — BRAIN NATRIURETIC PEPTIDE: B Natriuretic Peptide: 29 pg/mL (ref 0.0–100.0)

## 2022-10-05 LAB — TSH: TSH: 3.632 u[IU]/mL (ref 0.350–4.500)

## 2022-10-05 MED ORDER — MOMETASONE FURO-FORMOTEROL FUM 100-5 MCG/ACT IN AERO
2.0000 | INHALATION_SPRAY | Freq: Two times a day (BID) | RESPIRATORY_TRACT | Status: DC
  Administered 2022-10-06 – 2022-10-09 (×7): 2 via RESPIRATORY_TRACT
  Filled 2022-10-05: qty 8.8

## 2022-10-05 MED ORDER — LEVOTHYROXINE SODIUM 25 MCG PO TABS
25.0000 ug | ORAL_TABLET | Freq: Every day | ORAL | Status: DC
  Administered 2022-10-06: 25 ug via ORAL
  Filled 2022-10-05: qty 1

## 2022-10-05 MED ORDER — ALBUTEROL SULFATE (2.5 MG/3ML) 0.083% IN NEBU: 2.5000 mg | INHALATION_SOLUTION | Freq: Four times a day (QID) | RESPIRATORY_TRACT | Status: DC | PRN

## 2022-10-05 MED ORDER — SODIUM CHLORIDE 0.9% FLUSH: 3.0000 mL | INTRAVENOUS | Status: DC | PRN

## 2022-10-05 MED ORDER — ONDANSETRON HCL 4 MG/2ML IJ SOLN: 4.0000 mg | Freq: Four times a day (QID) | INTRAMUSCULAR | Status: DC | PRN

## 2022-10-05 MED ORDER — SODIUM CHLORIDE 0.9% FLUSH
3.0000 mL | Freq: Two times a day (BID) | INTRAVENOUS | Status: DC
  Administered 2022-10-05 – 2022-10-08 (×6): 3 mL via INTRAVENOUS

## 2022-10-05 MED ORDER — SODIUM CHLORIDE (PF) 0.9 % IJ SOLN
INTRAMUSCULAR | Status: AC
  Filled 2022-10-05: qty 50

## 2022-10-05 MED ORDER — FLUTICASONE PROPIONATE 50 MCG/ACT NA SUSP
1.0000 | Freq: Two times a day (BID) | NASAL | Status: DC
  Administered 2022-10-05 – 2022-10-09 (×8): 1 via NASAL
  Filled 2022-10-05: qty 16

## 2022-10-05 MED ORDER — CHLORHEXIDINE GLUCONATE CLOTH 2 % EX PADS
6.0000 | MEDICATED_PAD | Freq: Every day | CUTANEOUS | Status: DC
  Administered 2022-10-06 – 2022-10-09 (×4): 6 via TOPICAL

## 2022-10-05 MED ORDER — ALUM & MAG HYDROXIDE-SIMETH 200-200-20 MG/5ML PO SUSP
15.0000 mL | Freq: Four times a day (QID) | ORAL | Status: DC | PRN
  Administered 2022-10-06 – 2022-10-08 (×3): 15 mL via ORAL
  Filled 2022-10-05 (×4): qty 30

## 2022-10-05 MED ORDER — MEMANTINE HCL 5 MG PO TABS
10.0000 mg | ORAL_TABLET | Freq: Two times a day (BID) | ORAL | Status: DC
  Administered 2022-10-05 – 2022-10-09 (×8): 10 mg via ORAL
  Filled 2022-10-05 (×9): qty 2

## 2022-10-05 MED ORDER — SODIUM CHLORIDE 0.9 % IV SOLN
250.0000 mL | INTRAVENOUS | Status: DC | PRN
  Administered 2022-10-08: 250 mL via INTRAVENOUS

## 2022-10-05 MED ORDER — PANTOPRAZOLE SODIUM 40 MG PO TBEC
40.0000 mg | DELAYED_RELEASE_TABLET | Freq: Every day | ORAL | Status: DC
  Administered 2022-10-06 – 2022-10-09 (×4): 40 mg via ORAL
  Filled 2022-10-05 (×4): qty 1

## 2022-10-05 MED ORDER — PRAVASTATIN SODIUM 20 MG PO TABS
80.0000 mg | ORAL_TABLET | Freq: Every day | ORAL | Status: DC
  Administered 2022-10-06 – 2022-10-09 (×4): 80 mg via ORAL
  Filled 2022-10-05 (×4): qty 4

## 2022-10-05 MED ORDER — AMLODIPINE BESYLATE 5 MG PO TABS
10.0000 mg | ORAL_TABLET | Freq: Every day | ORAL | Status: DC
  Administered 2022-10-06: 10 mg via ORAL
  Filled 2022-10-05: qty 2

## 2022-10-05 MED ORDER — CLOPIDOGREL BISULFATE 75 MG PO TABS
75.0000 mg | ORAL_TABLET | Freq: Every day | ORAL | Status: DC
  Administered 2022-10-06 – 2022-10-09 (×4): 75 mg via ORAL
  Filled 2022-10-05 (×4): qty 1

## 2022-10-05 MED ORDER — IPRATROPIUM BROMIDE 0.06 % NA SOLN: 2.0000 | Freq: Four times a day (QID) | NASAL | Status: DC | PRN

## 2022-10-05 MED ORDER — ONDANSETRON HCL 4 MG PO TABS: 4.0000 mg | ORAL_TABLET | Freq: Four times a day (QID) | ORAL | Status: DC | PRN

## 2022-10-05 MED ORDER — IOHEXOL 350 MG/ML SOLN
100.0000 mL | Freq: Once | INTRAVENOUS | Status: AC | PRN
  Administered 2022-10-05: 100 mL via INTRAVENOUS

## 2022-10-05 MED ORDER — LORATADINE 10 MG PO TABS
10.0000 mg | ORAL_TABLET | Freq: Every day | ORAL | Status: DC
  Administered 2022-10-06 – 2022-10-09 (×4): 10 mg via ORAL
  Filled 2022-10-05 (×4): qty 1

## 2022-10-05 NOTE — Assessment & Plan Note (Addendum)
CTA chest and exam suggest edema in mediastinum and  tissue suggesting third spacing of fluid Her left arm and face are improved, but still has swelling in her neck/chest  CTA chest and CT soft tissue of neck with no seen obstruction, diffuse anasarca. No obstruction of venous system Does have history of SBO requiring laparoscopy x 2 in 2016 and 2021. With her dementia she always complains of stomach pain, but usually has N/V. She has had no N/V. Will check abdominal xray and if abnormal check CT abdomen/pelvis to make sure SBO not contributing to edema/3rd spacing.  Albumin 3.0  May benefit from albumin and diuretics

## 2022-10-05 NOTE — Assessment & Plan Note (Signed)
Per daughter has been worse x 2 weeks TSH wnl today, check free T4 Check B12 PT eval and nutrition eval

## 2022-10-05 NOTE — ED Triage Notes (Addendum)
Patient has had left facial swelling, left arm, left hand swelling for 1 week. Her PCP sent her here to rule out a blood clot or see if her cancer came back. History of lung cancer.

## 2022-10-05 NOTE — Assessment & Plan Note (Signed)
No signs of exacerbation Continue home symbicort and SABA prn  Continue allergy medication and flonase On Benralizumab q8 weeks.

## 2022-10-05 NOTE — H&P (Signed)
History and Physical    Patient: Angelica [name] UJW:[id number] DOB: 07/12/1940 DOA: 10/05/2022 DOS: the patient was seen and examined on 10/05/2022 PCP: [name], MD  Patient coming from:  PCP office  - lives with her daughter.    Chief Complaint: left arm swelling/facial swelling   HPI: Angelica [name] is a 82 y.o. female with medical history significant of GERD, HTN, HLD, IDA, asthma, stage 3 adenocarcinoma of the lung, hypothyroidism, dementia who was sent to ED from her PCP office due to swelling of her left arm and face. Her daughter tells me one week ago they noticed some swelling in her eyes. They thought it was allergies. On Monday she saw her neurologist who also thought it was allergies. She didn't feel good on Tuesday and they came to ED where only abnormal finding was high TSH. She had been off her levothyroxine so her daughter started this back.  She was sent home.  She saw her PCP today for ER follow up from Tuesday. This morning her left arm was swollen and she felt like it felt funny to swallow in her left side. When they saw her PCP he thought it was swollen as well and recommended they come to ED. Swelling in her arm and her left arm are better now than they were this morning. She has had normal BM yesterday. Does have history of SBO with surgery in 2016 and again in 2021 with laparoscopy and lysis of adhesions.     Denies any fever/chills, vision changes/headaches, chest pain or palpitations, shortness of breath or cough, abdominal pain (chronic, at her baseline), N/V/D, dysuria or leg swelling. Daughter states she has been eating and drinking fine at home.    She does not smoke or drink alcohol.   ER Course:  vitals: afebrile, bp: 129/82, HR: 85, RR: 18, oxygen: 98%RA Pertinent labs: hgb: 11.3, lactic acid: 2.7,  CTA chest: no PE. Moderate to large bilateral pleural effusions with associated passive atelectasis. Diffuse low-grade infiltrative edema in the mediastinum  and also in the [facility] tissues suggesting third spacing of fluid.  CT soft tissue neck: Diffuse anasarca, which may be slightly more asymmetric in the left neck compared to the right. No definite underlying lesion is visualized. 2. Small bilateral pleural effusions, new from prior exam. See separately dictated chest for findings below the thoracic inlet. [place] negative of left arm for DVT  In ED: [facility] asked to admit.    Review of Systems: As mentioned in the history of present illness. All other systems reviewed and are negative. Past Medical History:  Diagnosis Date   Arthritis    Asthma    Bowel obstruction (HCC)    Cancer (HCC)    Environmental allergies    GERD (gastroesophageal reflux disease)    Glaucoma 05/09/2017   H. pylori infection    History of radiation therapy 07/05/20-08/05/20   Left lung IMRT Dr. Roselind Messier    HLD (hyperlipidemia) 01/16/2019   Hypertension    Iron deficiency anemia    Osteopenia 05/26/2017   Thyroid disease    Urticaria 07/25/2018   Past Surgical History:  Procedure Laterality Date   ABDOMINAL HYSTERECTOMY     BOWEL RESECTION N/A 05/23/2020   Procedure: SMALL BOWEL REPAIR;  Surgeon: Sheliah Hatch De Blanch, MD;  Location: [facility] OR;  Service: General;  Laterality: N/A;   BREAST BIOPSY     BRONCHIAL BIOPSY  06/10/2020   Procedure: BRONCHIAL BIOPSIES;  Surgeon: [name], DO;  Location:  [facility];  Service: Pulmonary;;   BRONCHIAL BRUSHINGS  06/10/2020   Procedure: BRONCHIAL BRUSHINGS;  Surgeon: [name], DO;  Location: [facility];  Service: Pulmonary;;   BRONCHIAL NEEDLE ASPIRATION BIOPSY  06/10/2020   Procedure: BRONCHIAL NEEDLE ASPIRATION BIOPSIES;  Surgeon: [name], DO;  Location: [facility];  Service: Pulmonary;;   BRONCHIAL WASHINGS  06/10/2020   Procedure: BRONCHIAL WASHINGS;  Surgeon: [name], DO;  Location: [facility];  Service: Pulmonary;;   COLON SURGERY     DIAGNOSTIC LARYNGOSCOPY N/A 05/23/2020   Procedure: ATTEMPTED  DIAGNOSTIC LAPAROSCOPY WITH ADHESIONS;  Surgeon: Sheliah Hatch, De Blanch, MD;  Location: [facility];  Service: General;  Laterality: N/A;   IR IMAGING GUIDED PORT INSERTION  07/15/2020   KNEE SURGERY     LAPAROTOMY N/A 04/18/2015   Procedure: Exploratory laparotomy with lysis of adhesions, possible bowel resection;  Surgeon: Axel Filler, MD;  Location: [facility];  Service: General;  Laterality: N/A;   LAPAROTOMY N/A 05/23/2020   Procedure: EXPLORATORY LAPAROTOMY;  Surgeon: Rodman Pickle, MD;  Location: [facility];  Service: General;  Laterality: N/A;   LYSIS OF ADHESION N/A 05/23/2020   Procedure: LYSIS OF ADHESION;  Surgeon: Sheliah Hatch De Blanch, MD;  Location: [facility];  Service: General;  Laterality: N/A;   NASAL SINUS SURGERY     THYROID SURGERY     VIDEO BRONCHOSCOPY WITH ENDOBRONCHIAL NAVIGATION Bilateral 06/10/2020   Procedure: VIDEO BRONCHOSCOPY WITH ENDOBRONCHIAL NAVIGATION;  Surgeon: [name], DO;  [facility];  Service: Pulmonary;  Laterality: Bilateral;   VIDEO BRONCHOSCOPY WITH ENDOBRONCHIAL ULTRASOUND  06/10/2020   Procedure: VIDEO BRONCHOSCOPY WITH ENDOBRONCHIAL ULTRASOUND;  Surgeon: [name], DO;  Location: [facility];  Service: Pulmonary;;   Social History:  reports that she has quit smoking. Her smoking use included cigarettes. She has never used smokeless tobacco. She reports that she does not drink alcohol and does not use drugs.  Allergies  Allergen Reactions   Asa Buff (Mag [Buffered Aspirin] Other (See Comments)    Excessive sweating   Ensure [Nutritional Supplements]     Or boost - upset stomach    Fish Allergy Other (See Comments)    Unknown reaction, but allergic   Other Other (See Comments)    Gelcaps; gel-containing capsules; extended-release medication - upset stomach    Peanut-Containing Drug Products Other (See Comments)    From allergy test   Penicillins Itching    Has patient had a PCN reaction causing immediate rash,  facial/tongue/throat swelling, SOB or lightheadedness with hypotension: No Has patient had a PCN reaction causing severe rash involving mucus membranes or skin necrosis: No Has patient had a PCN reaction that required hospitalization No Has patient had a PCN reaction occurring within the last 10 years: No If all of the above answers are "NO", then may proceed with Cephalosporin use.   Shellfish-Derived Products Other (See Comments)    From allergy test   Strawberry Extract Other (See Comments)    From allergy test     Family History  Problem Relation Age of Onset   Diabetes Mother    Hypertension Mother    Hypertension Father    Glaucoma Sister    Glaucoma Brother    Allergic rhinitis Neg Hx    Angioedema Neg Hx    Asthma Neg Hx    Eczema Neg Hx    Immunodeficiency Neg Hx    Urticaria Neg Hx     Prior to Admission medications   Medication  Sig Start Date End Date Taking? Authorizing Provider  acetaminophen (TYLENOL) 500 MG tablet Take 500 mg by mouth every 6 (six) hours as needed for moderate pain.   Yes [provider]  alum & mag hydroxide-simeth (MAALOX/MYLANTA) 200-200-20 MG/5ML suspension Take 15 mLs by mouth every 6 (six) hours as needed for indigestion or heartburn.   Yes [provider]  amLODipine (NORVASC) 10 MG tablet Take 1 tablet by mouth once daily 08/20/22  Yes [name], MD  carboxymethylcellulose (REFRESH PLUS) 0.5 % SOLN Place 1 drop into both eyes daily as needed (Uses as needed).   Yes [provider]  clopidogrel (PLAVIX) 75 MG tablet Take 1 tablet by mouth once daily 07/05/22  Yes [name], MD  dextromethorphan 7.5 MG/5ML SYRP Take 20 mLs by mouth 2 (two) times daily.   Yes [provider]  EQ ALLERGY RELIEF, CETIRIZINE, 10 MG tablet Take 1 tablet by mouth once daily 09/24/22  Yes Padgett, Pilar Grammes, MD  fluticasone [facility]) 50 MCG/ACT nasal spray Place 1 spray into both nostrils in the morning and at bedtime.  01/05/22  Yes Padgett, Pilar Grammes, MD  ipratropium (ATROVENT) 0.06 % nasal spray Place 2 sprays into both nostrils 4 (four) times daily as needed (nasal drainage). 10/04/22  Yes Padgett, Pilar Grammes, MD  memantine (NAMENDA) 10 MG tablet Take 1 tablet (10 mg total) by mouth 2 (two) times daily. 04/17/22  Yes Micki Riley, MD  pravastatin (PRAVACHOL) 80 MG tablet Take 1 tablet by mouth once daily 07/23/22  Yes [name], MD  SYMBICORT 80-4.5 MCG/ACT inhaler INHALE 2 PUFFS BY MOUTH IN THE MORNING AND AT BEDTIME 10/04/22  Yes Padgett, Pilar Grammes, MD  albuterol (VENTOLIN HFA) 108 (90 Base) MCG/ACT inhaler Inhale 2 puffs into the lungs every 4 (four) hours as needed for wheezing or shortness of breath. 10/04/22   Marcelyn Bruins, MD  Benralizumab [facility]) 30 MG/ML SOSY Inject 1 mL (30 mg total) into the skin every 8 (eight) weeks. 06/12/22   Alfonse Spruce, MD  diclofenac Sodium (VOLTAREN) 1 % GEL Apply 4 g topically 4 (four) times daily. 04/23/22   Rodolph Bong, MD  EPINEPHrine (EPIPEN 2-PAK) 0.3 mg/0.3 mL IJ SOAJ injection Use as directed for severe allergic reaction 09/24/22   [name] F, FNP  Magnesium Hydroxide (PHILLIPS MILK OF MAGNESIA PO) Take 15-30 mLs by mouth daily as needed (for constipation).    [provider]  montelukast (SINGULAIR) 10 MG tablet Take 1 tablet (10 mg total) by mouth at bedtime. 10/04/22   Marcelyn Bruins, MD  ondansetron (ZOFRAN-ODT) 4 MG disintegrating tablet Take 1 tablet (4 mg total) by mouth every 8 (eight) hours as needed for nausea or vomiting. 07/09/22   Gwenlyn Fudge, FNP  pantoprazole (PROTONIX) 40 MG tablet Take 1 tablet (40 mg total) by mouth daily. 09/07/22   [name], MD  polyethylene glycol (MIRALAX / GLYCOLAX) 17 g packet Take 17 g by mouth daily as needed for mild constipation.    [provider]    Physical Exam: Vitals:   10/05/22 1307 10/05/22 1430 10/05/22 1631 10/05/22 2009  BP:   (!) 132/99 119/71 125/71  Pulse:  (!) 121 (!) 107 92  Resp:  (!) 21 (!) 22 20  Temp: 97.6 F (36.4 C) 98.5 F (36.9 C) 98.3 F (36.8 C) 98.2 F (36.8 C)  TempSrc:  Oral Oral Oral  SpO2:  100% 97% 100%  Weight:  Height:       General:  Appears calm and comfortable and is in NAD Eyes:  PERRL, EOMI, normal lids, iris. Minimal edema in lower lids/under eye  ENT:  grossly normal hearing, lips & tongue, mmm; dentures  Neck:  no LAD, masses or thyromegaly; no carotid bruits. [place] edema over anterior superior chest wall  Cardiovascular:  RRR, no m/r/g. No LE edema.  Respiratory:   CTA bilaterally with no wheezes/rales/rhonchi.  Normal respiratory effort. Abdomen:  soft, NT, ND, NABS Back:   normal alignment, no CVAT Skin:  no rash or induration seen on limited exam Musculoskeletal:  grossly normal tone BUE/BLE, good ROM, no bony abnormality Lower extremity:  No LE edema.  Limited foot exam with no ulcerations.  2+ distal pulses. Psychiatric:  grossly normal mood and affect, speech fluent and appropriate, AOx3 Neurologic:  CN 2-12 grossly intact, moves all extremities in coordinated fashion, sensation intact   Radiological Exams on Admission: Independently reviewed - see discussion in A/P where applicable  UE Venous Duplex ([facility] and [facility] ONLY)  Result Date: 10/05/2022 UPPER VENOUS STUDY  Patient Name:  IMOGEAN PICHETTE  Date of Exam:   10/05/2022 Medical Rec #: [id number]         Accession #:    [id number] Date of Birth: 09/26/1940          Patient Gender: F Patient Age:   12 years Exam Location:  [facility] Procedure:      VAS [place] UPPER EXTREMITY VENOUS DUPLEX Referring Phys: Lynden Oxford --------------------------------------------------------------------------------  Indications: Left arm, face, and hand swelling Risk Factors: HX of Lung CA. Comparison Study: No previosu exams Performing Technologist: Jody Hill RVT, RDMS  Examination Guidelines: A complete evaluation includes  B-mode imaging, spectral Doppler, color Doppler, and power Doppler as needed of all accessible portions of each vessel. Bilateral testing is considered an integral part of a complete examination. Limited examinations for reoccurring indications may be performed as noted.  Right Findings: +----------+------------+---------+-----------+----------+-------+ RIGHT     CompressiblePhasicitySpontaneousPropertiesSummary +----------+------------+---------+-----------+----------+-------+ Subclavian               Yes       Yes                      +----------+------------+---------+-----------+----------+-------+ Limited visualization of subclavian due to port placement patent by color and doppler  Left Findings: +----------+------------+---------+-----------+----------+-------+ LEFT      CompressiblePhasicitySpontaneousPropertiesSummary +----------+------------+---------+-----------+----------+-------+ IJV           Full       Yes       Yes                      +----------+------------+---------+-----------+----------+-------+ Subclavian    Full       Yes       Yes                      +----------+------------+---------+-----------+----------+-------+ Axillary      Full       Yes       Yes                      +----------+------------+---------+-----------+----------+-------+ Brachial      Full       Yes       Yes                      +----------+------------+---------+-----------+----------+-------+ Radial  Full                                          +----------+------------+---------+-----------+----------+-------+ Ulnar         Full                                          +----------+------------+---------+-----------+----------+-------+ Cephalic      Full                                          +----------+------------+---------+-----------+----------+-------+ Basilic       Full       Yes       Yes                       +----------+------------+---------+-----------+----------+-------+  Summary:  Right: No evidence of thrombosis in the subclavian.  Left: No evidence of deep vein thrombosis in the upper extremity. No evidence of superficial vein thrombosis in the upper extremity.  *See table(s) above for measurements and observations.  Diagnosing physician: Gerarda Fraction Electronically signed by Gerarda Fraction on 10/05/2022 at 2:56:59 PM.    Final    CT Soft Tissue Neck W Contrast  Result Date: 10/05/2022 CLINICAL DATA:  Neck mass, nonpulsatile concern for left neck and face swelling EXAM: CT NECK WITH CONTRAST TECHNIQUE: Multidetector CT imaging of the neck was performed using the standard protocol following the bolus administration of intravenous contrast. RADIATION DOSE REDUCTION: This exam was performed according to the departmental dose-optimization program which includes automated exposure control, adjustment of the mA and/or kV according to patient size and/or use of iterative reconstruction technique. CONTRAST:  OMNIPAQUE IOHEXOL 350 MG/ML SOLN COMPARISON:  None Available. FINDINGS: Limitations: Assessment is slightly limited due to motion artifact of the level of the oral and hypopharynx. Pharynx and larynx: The epiglottis is normal in appearance. Bilateral tonsils are normal in appearance. No evidence of parapharyngeal space soft tissue stranding. Salivary glands: No inflammation, mass, or stone. Thyroid: Heterogeneous and multinodular left thyroid lobe, not significantly changed compared to prior CT chest dated 04/11/2019. Lymph nodes: Within the limitations of motion artifact, no definite cervical lymphadenopathy is visualized. Vascular: Negative. Limited intracranial: Incompletely imaged Visualized orbits: Not imaged Mastoids and visualized paranasal sinuses: No middle ear or mastoid effusion. There is mucosal thickening in the bilateral maxillary sinuses. Skeleton: Straightening of the normal cervical lordosis.  No suspicious osseous lesions. Upper chest: Small bilateral pleural effusions, new from prior exam. Right chest port in place with tip visualized to the level of the mid to lower SVC. See separately dictated chest for findings below the thoracic inlet. Other: There is diffuse anasarca there may be slightly more asymmetric in the left neck compared to the right. No definite underlying lesion is visualized. IMPRESSION: 1. Diffuse anasarca, which may be slightly more asymmetric in the left neck compared to the right. No definite underlying lesion is visualized. 2. Small bilateral pleural effusions, new from prior exam. See separately dictated chest for findings below the thoracic inlet. Electronically Signed   By: Lorenza Cambridge M.D.   On: 10/05/2022 14:27   CT Angio Chest PE W and/or Wo Contrast  Result Date: 10/05/2022  CLINICAL DATA:  Left lower neck swelling. Cancer history. Query pulmonary embolus or SVC syndrome. EXAM: CT ANGIOGRAPHY CHEST WITH CONTRAST TECHNIQUE: Multidetector CT imaging of the chest was performed using the standard protocol during bolus administration of intravenous contrast. Multiplanar CT image reconstructions and MIPs were obtained to evaluate the vascular anatomy. RADIATION DOSE REDUCTION: This exam was performed according to the departmental dose-optimization program which includes automated exposure control, adjustment of the mA and/or kV according to patient size and/or use of iterative reconstruction technique. CONTRAST:  OMNIPAQUE IOHEXOL 350 MG/ML SOLN COMPARISON:  07/10/2022 FINDINGS: Cardiovascular: No filling defect is identified in the pulmonary arterial tree to suggest pulmonary embolus. Coronary, aortic arch, and branch vessel atherosclerotic vascular disease. No thoracic aortic aneurysm or dissection is identified. Right Port-A-Cath tip: SVC. Because today's exam is timed for pulmonary arterial opacification, the systemic veins are not opacified and accordingly  assessment of patency is limited. Mediastinum/Nodes: No obvious mass lesion impinging on the SVC, brachiocephalic vein, or left axillary vein. There is diffuse low-grade infiltrative edema in the mediastinum and also in the subcutaneous tissues bilaterally. Lungs/Pleura: Moderate to large bilateral pleural effusions with associated passive atelectasis. Upper Abdomen: Unremarkable Musculoskeletal: Thoracic spondylosis. Review of the MIP images confirms the above findings. IMPRESSION: 1. No filling defect is identified in the pulmonary arterial tree to suggest pulmonary embolus. 2. Moderate to large bilateral pleural effusions with associated passive atelectasis. 3. Diffuse low-grade infiltrative edema in the mediastinum and also in the subcutaneous tissues bilaterally, suggesting third spacing of fluid. 4. No obvious mass lesion impinging on the SVC, brachiocephalic vein, or left axillary vein. Please note that systemic venous structures are not opacified on today's exam, which was timed for pulmonary arterial opacification. Electronically Signed   By: Gaylyn Rong M.D.   On: 10/05/2022 14:08    EKG: Independently reviewed.  NSR with rate 73; nonspecific ST changes with no evidence of acute ischemia   Labs on Admission: I have personally reviewed the available labs and imaging studies at the time of the admission.  Pertinent labs:   hgb: 11.3,  lactic acid: 2.7>1.4  Assessment and Plan: Principal Problem:   Acute respiratory failure with hypoxia (HCC) Active Problems:   Edema   Bilateral pleural effusion   Weakness   Hypothyroidism   Adenocarcinoma of left lung, stage 3 (HCC)   Asthma, severe persistent, well-controlled   HTN (hypertension)   Iron deficiency   History of stroke   Dementia (HCC)   HLD (hyperlipidemia)   GERD (gastroesophageal reflux disease)   Other allergic rhinitis    Assessment and Plan: * Acute respiratory failure with hypoxia (HCC) 82 year old female  presenting with 2 week history of worsening fatigue and weakness found to have edema in face/left arm at PCP and sent to ED where patient was hypoxic on room air to 88% and found to have large bilateral pleural effusions and diffuse third spacing of fluids  -obs to tele  -oxygen turned off while in room and sats stayed 99-100% on RA -likely secondary to large pleural effusions. Troponin/BNP wnl  -IR consult for therapeutic and diagnostic thoracentesis, labs ordered  -wean as tolerated   Edema CTA chest and exam suggest edema in mediastinum and [place] tissue suggesting third spacing of fluid Her left arm and face are improved, but still has swelling in her neck/chest  CTA chest and CT soft tissue of neck with no seen obstruction, diffuse anasarca. No obstruction of venous system Does have history of SBO  requiring laparoscopy x 2 in 2016 and 2021. With her dementia she always complains of stomach pain, but usually has N/V. She has had no N/V. Will check abdominal xray and if abnormal check CT abdomen/pelvis to make sure SBO not contributing to edema/3rd spacing.  Albumin 3.0  May benefit from albumin and diuretics    Bilateral pleural effusion Moderate to large bilateral pleural effusions seen on CTA chest  Hx of lung cancer and new finding with hypoxia IR consulted for diagnostic/therapeutic thoracentesis  Work up for edema/anasarca   Weakness Per daughter has been worse x 2 weeks TSH wnl today, check free T4 Check B12 PT eval and nutrition eval   Hypothyroidism immunotherapy mediated hypothyroidism  TSH abnormal on 5/14 and her daughter started back her synthroid  TSH wnl today  Check T4 and continue synthroid   Adenocarcinoma of left lung, stage 3 (HCC) S/p chemo and radiation. Followed q 6 months Finished her chemo in 2022  Asthma, severe persistent, well-controlled No signs of exacerbation Continue home symbicort and SABA prn  Continue allergy medication and flonase On  Benralizumab q8 weeks.   HTN (hypertension) Well controlled, continue norvasc 10mg  daily   Iron deficiency At baseline, continue to trend   History of stroke MRI in 2023 showed small chronic infarcts within the right cerebellar hemisphere, new from the prior MRI. On plavix and statin   Dementia (HCC) Delirium precautions/fall precautions  Continue namenda   HLD (hyperlipidemia) Continue statin   GERD (gastroesophageal reflux disease) Continue PPI   Other allergic rhinitis Continue flonase/zyrtec daily     Advance Care Planning:   Code Status: Full Code  Consults: IR, PT/nutrition   DVT Prophylaxis: SCDs   Family Communication: daughter at bedside   Severity of Illness: The appropriate patient status for this patient is OBSERVATION. Observation status is judged to be reasonable and necessary in order to provide the required intensity of service to ensure the patient's safety. The patient's presenting symptoms, physical exam findings, and initial radiographic and laboratory data in the context of their medical condition is felt to place them at decreased risk for further clinical deterioration. Furthermore, it is anticipated that the patient will be medically stable for discharge from the hospital within 2 midnights of admission.   Author: Orland Mustard, MD 10/05/2022 8:30 PM  For on call review [email or web address].

## 2022-10-05 NOTE — Assessment & Plan Note (Signed)
Did have recent tsh slightly worsening 5.14 but will hold on any med changes for now given current need for further evalatuion and tx

## 2022-10-05 NOTE — Progress Notes (Signed)
LUE venous duplex has been completed.  Preliminary results given to Dr. Rush Landmark.    Results can be found under chart review under CV PROC. 10/05/2022 2:50 PM Hellen Shanley RVT, RDMS

## 2022-10-05 NOTE — Assessment & Plan Note (Signed)
Continue flonase/zyrtec daily

## 2022-10-05 NOTE — Assessment & Plan Note (Signed)
At baseline, continue to trend  

## 2022-10-05 NOTE — Assessment & Plan Note (Signed)
Continue statin. 

## 2022-10-05 NOTE — ED Provider Notes (Signed)
[place] EMERGENCY DEPARTMENT AT [facility] Provider Note   CSN: [id number] Arrival date & time: 10/05/22  2595     History  Chief Complaint  Patient presents with   Facial Swelling    Angelica [name] is a 82 y.o. female.  The history is provided by the patient and medical records. No language interpreter was used.  Shortness of Breath Severity:  Moderate Onset quality:  Gradual Duration:  1 week Timing:  Constant Progression:  Waxing and waning Chronicity:  New Context: not URI   Relieved by:  Nothing Worsened by:  Exertion Ineffective treatments:  None tried Associated symptoms: no abdominal pain, no chest pain, no cough, no fever, no headaches, no neck pain, no rash, no sputum production, no vomiting and no wheezing   Risk factors: hx of cancer   Risk factors: no hx of PE/DVT        Home Medications Prior to Admission medications   Medication Sig Start Date End Date Taking? Authorizing Provider  acetaminophen (TYLENOL) 500 MG tablet Take 500 mg by mouth every 6 (six) hours as needed for moderate pain.    [provider]  albuterol (VENTOLIN HFA) 108 (90 Base) MCG/ACT inhaler Inhale 2 puffs into the lungs every 4 (four) hours as needed for wheezing or shortness of breath. 10/04/22   Marcelyn Bruins, MD  alum & mag hydroxide-simeth (MAALOX/MYLANTA) 200-200-20 MG/5ML suspension Take 15 mLs by mouth every 6 (six) hours as needed for indigestion or heartburn.    [provider]  amLODipine (NORVASC) 10 MG tablet Take 1 tablet by mouth once daily 08/20/22   [name], MD  Benralizumab [facility]) 30 MG/ML SOSY Inject 1 mL (30 mg total) into the skin every 8 (eight) weeks. 06/12/22   Alfonse Spruce, MD  benzonatate (TESSALON) 100 MG capsule Take 1 capsule (100 mg total) by mouth at bedtime as needed for cough. 09/24/22   Gwenlyn Fudge, FNP  clopidogrel (PLAVIX) 75 MG tablet Take 1 tablet by mouth once daily 07/05/22   [name], MD  diclofenac Sodium (VOLTAREN) 1 % GEL Apply 4 g topically 4 (four) times daily. 04/23/22   Rodolph Bong, MD  EPINEPHrine (EPIPEN 2-PAK) 0.3 mg/0.3 mL IJ SOAJ injection Use as directed for severe allergic reaction 09/24/22   Gwenlyn Fudge, FNP  EQ ALLERGY RELIEF, CETIRIZINE, 10 MG tablet Take 1 tablet by mouth once daily 09/24/22   Marcelyn Bruins, MD  fluticasone [facility]) 50 MCG/ACT nasal spray Place 1 spray into both nostrils in the morning and at bedtime. 01/05/22   Marcelyn Bruins, MD  ipratropium (ATROVENT) 0.06 % nasal spray Place 2 sprays into both nostrils 4 (four) times daily as needed (nasal drainage). 10/04/22   Marcelyn Bruins, MD  linaclotide [facility]) 72 MCG capsule TAKE 1 CAPSULE BY MOUTH ONCE DAILY AS NEEDED 06/14/20   [name], MD  Magnesium Hydroxide (PHILLIPS MILK OF MAGNESIA PO) Take 15-30 mLs by mouth daily as needed (for constipation).    [provider]  memantine (NAMENDA) 10 MG tablet Take 1 tablet (10 mg total) by mouth 2 (two) times daily. 04/17/22   Micki Riley, MD  montelukast (SINGULAIR) 10 MG tablet Take 1 tablet (10 mg total) by mouth at bedtime. 10/04/22   Marcelyn Bruins, MD  ondansetron (ZOFRAN-ODT) 4 MG disintegrating tablet Take 1 tablet (4 mg total) by mouth every 8 (eight) hours as needed for nausea or vomiting. 07/09/22  Gwenlyn Fudge, FNP  pantoprazole (PROTONIX) 40 MG tablet Take 1 tablet (40 mg total) by mouth daily. 09/07/22   [name], MD  polyethylene glycol (MIRALAX / GLYCOLAX) 17 g packet Take 17 g by mouth daily as needed for mild constipation.    [provider]  pravastatin (PRAVACHOL) 80 MG tablet Take 1 tablet by mouth once daily 07/23/22   [name], MD  SYMBICORT 80-4.5 MCG/ACT inhaler INHALE 2 PUFFS BY MOUTH IN THE MORNING AND AT BEDTIME 10/04/22   Marcelyn Bruins, MD      Allergies    Asa buff (mag [buffered aspirin], Ensure [nutritional supplements],  Fish allergy, Other, Peanut-containing drug products, Penicillins, Shellfish-derived products, and Strawberry extract    Review of Systems   Review of Systems  Constitutional:  Positive for fatigue. Negative for chills and fever.  HENT:  Negative for congestion.   Eyes:  Negative for visual disturbance.  Respiratory:  Positive for shortness of breath. Negative for cough, sputum production, chest tightness, wheezing and stridor.   Cardiovascular:  Negative for chest pain, palpitations and leg swelling.  Gastrointestinal:  Negative for abdominal pain, constipation, diarrhea, nausea and vomiting.  Genitourinary:  Negative for dysuria and flank pain.  Musculoskeletal:  Negative for back pain, neck pain and neck stiffness.  Skin:  Negative for rash and wound.  Neurological:  Negative for dizziness, weakness, light-headedness and headaches.  Psychiatric/Behavioral:  Negative for agitation and confusion.   All other systems reviewed and are negative.   Physical Exam Updated Vital Signs BP 129/82 (BP Location: Right Arm)   Pulse 85   Temp 98 F (36.7 C) (Oral)   Resp 18   Ht 5\' 2"  (1.575 m)   Wt 66.7 kg   SpO2 98%   BMI 26.89 kg/m  Physical Exam Vitals and nursing note reviewed.  Constitutional:      General: She is not in acute distress.    Appearance: She is well-developed. She is not ill-appearing, toxic-appearing or diaphoretic.  HENT:     Head: Normocephalic and atraumatic.     Nose: No congestion or rhinorrhea.     Mouth/Throat:     Mouth: Mucous membranes are moist.     Pharynx: No oropharyngeal exudate or posterior oropharyngeal erythema.  Eyes:     Extraocular Movements: Extraocular movements intact.     Conjunctiva/sclera: Conjunctivae normal.     Pupils: Pupils are equal, round, and reactive to light.  Cardiovascular:     Rate and Rhythm: Normal rate and regular rhythm.     Pulses: Normal pulses.     Heart sounds: No murmur heard. Pulmonary:     Effort: Pulmonary  effort is normal. No respiratory distress.     Breath sounds: Normal breath sounds. No wheezing, rhonchi or rales.  Chest:     Chest wall: No tenderness.  Abdominal:     General: Abdomen is flat.     Palpations: Abdomen is soft.     Tenderness: There is no abdominal tenderness. There is no right CVA tenderness, left CVA tenderness, guarding or rebound.  Musculoskeletal:        General: No swelling or tenderness.     Cervical back: Neck supple. No tenderness.     Right lower leg: No edema.     Left lower leg: No edema.     Comments: Possible mild edema in left arm compared to right.  Skin:    General: Skin is warm and dry.  Capillary Refill: Capillary refill takes less than 2 seconds.     Findings: No erythema or rash.  Neurological:     General: No focal deficit present.     Mental Status: She is alert.  Psychiatric:        Mood and Affect: Mood normal.     ED Results / Procedures / Treatments   Labs (all labs ordered are listed, but only abnormal results are displayed) Labs Reviewed  CBC WITH DIFFERENTIAL/PLATELET - Abnormal; Notable for the following components:      Result Value   Hemoglobin 11.3 (*)    HCT 34.9 (*)    RDW 17.0 (*)    All other components within normal limits  COMPREHENSIVE METABOLIC PANEL - Abnormal; Notable for the following components:   Calcium 8.6 (*)    Albumin 3.0 (*)    All other components within normal limits  BRAIN NATRIURETIC PEPTIDE  TSH  LACTIC ACID, PLASMA  LACTIC ACID, PLASMA  TROPONIN I (HIGH SENSITIVITY)  TROPONIN I (HIGH SENSITIVITY)    EKG EKG Interpretation  Date/Time:  Friday Oct 05 2022 10:00:37 EDT Ventricular Rate:  73 PR Interval:  149 QRS Duration: 84 QT Interval:  421 QTC Calculation: 464 R Axis:   -26 Text Interpretation: Sinus rhythm Borderline left axis deviation Low voltage, precordial leads when compared to prior, similar appearance, NO STEMI Confirmed by Theda Belfast ([id number]) on 10/05/2022 10:06:44  AM  Radiology UE Venous Duplex ([facility] and [facility] ONLY)  Result Date: 10/05/2022 UPPER VENOUS STUDY  Patient Name:  Angelica [name]  Date of Exam:   10/05/2022 Medical Rec #: [id number]         Accession #:    [id number] Date of Birth: 05/24/1940          Patient Gender: F Patient Age:   7 years Exam Location:  [facility] Procedure:      VAS [place] UPPER EXTREMITY VENOUS DUPLEX Referring Phys: Lynden Oxford --------------------------------------------------------------------------------  Indications: Left arm, face, and hand swelling Risk Factors: HX of Lung CA. Comparison Study: No previosu exams Performing Technologist: Jody Hill RVT, RDMS  Examination Guidelines: A complete evaluation includes B-mode imaging, spectral Doppler, color Doppler, and power Doppler as needed of all accessible portions of each vessel. Bilateral testing is considered an integral part of a complete examination. Limited examinations for reoccurring indications may be performed as noted.  Right Findings: +----------+------------+---------+-----------+----------+-------+ RIGHT     CompressiblePhasicitySpontaneousPropertiesSummary +----------+------------+---------+-----------+----------+-------+ Subclavian               Yes       Yes                      +----------+------------+---------+-----------+----------+-------+ Limited visualization of subclavian due to port placement patent by color and doppler  Left Findings: +----------+------------+---------+-----------+----------+-------+ LEFT      CompressiblePhasicitySpontaneousPropertiesSummary +----------+------------+---------+-----------+----------+-------+ IJV           Full       Yes       Yes                      +----------+------------+---------+-----------+----------+-------+ Subclavian    Full       Yes       Yes                      +----------+------------+---------+-----------+----------+-------+ Axillary      Full       Yes  Yes                      +----------+------------+---------+-----------+----------+-------+ Brachial      Full       Yes       Yes                      +----------+------------+---------+-----------+----------+-------+ Radial        Full                                          +----------+------------+---------+-----------+----------+-------+ Ulnar         Full                                          +----------+------------+---------+-----------+----------+-------+ Cephalic      Full                                          +----------+------------+---------+-----------+----------+-------+ Basilic       Full       Yes       Yes                      +----------+------------+---------+-----------+----------+-------+  Summary:  Right: No evidence of thrombosis in the subclavian.  Left: No evidence of deep vein thrombosis in the upper extremity. No evidence of superficial vein thrombosis in the upper extremity.  *See table(s) above for measurements and observations.  Diagnosing physician: Gerarda Fraction Electronically signed by Gerarda Fraction on 10/05/2022 at 2:56:59 PM.    Final    CT Soft Tissue Neck W Contrast  Result Date: 10/05/2022 CLINICAL DATA:  Neck mass, nonpulsatile concern for left neck and face swelling EXAM: CT NECK WITH CONTRAST TECHNIQUE: Multidetector CT imaging of the neck was performed using the standard protocol following the bolus administration of intravenous contrast. RADIATION DOSE REDUCTION: This exam was performed according to the departmental dose-optimization program which includes automated exposure control, adjustment of the mA and/or kV according to patient size and/or use of iterative reconstruction technique. CONTRAST:  OMNIPAQUE IOHEXOL 350 MG/ML SOLN COMPARISON:  None Available. FINDINGS: Limitations: Assessment is slightly limited due to motion artifact of the level of the oral and hypopharynx. Pharynx and larynx: The epiglottis is  normal in appearance. Bilateral tonsils are normal in appearance. No evidence of parapharyngeal space soft tissue stranding. Salivary glands: No inflammation, mass, or stone. Thyroid: Heterogeneous and multinodular left thyroid lobe, not significantly changed compared to prior CT chest dated 04/11/2019. Lymph nodes: Within the limitations of motion artifact, no definite cervical lymphadenopathy is visualized. Vascular: Negative. Limited intracranial: Incompletely imaged Visualized orbits: Not imaged Mastoids and visualized paranasal sinuses: No middle ear or mastoid effusion. There is mucosal thickening in the bilateral maxillary sinuses. Skeleton: Straightening of the normal cervical lordosis. No suspicious osseous lesions. Upper chest: Small bilateral pleural effusions, new from prior exam. Right chest port in place with tip visualized to the level of the mid to lower SVC. See separately dictated chest for findings below the thoracic inlet. Other: There is diffuse anasarca there may be slightly more asymmetric in the left neck compared to the right. No definite underlying lesion  is visualized. IMPRESSION: 1. Diffuse anasarca, which may be slightly more asymmetric in the left neck compared to the right. No definite underlying lesion is visualized. 2. Small bilateral pleural effusions, new from prior exam. See separately dictated chest for findings below the thoracic inlet. Electronically Signed   By: Lorenza Cambridge M.D.   On: 10/05/2022 14:27   CT Angio Chest PE W and/or Wo Contrast  Result Date: 10/05/2022 CLINICAL DATA:  Left lower neck swelling. Cancer history. Query pulmonary embolus or SVC syndrome. EXAM: CT ANGIOGRAPHY CHEST WITH CONTRAST TECHNIQUE: Multidetector CT imaging of the chest was performed using the standard protocol during bolus administration of intravenous contrast. Multiplanar CT image reconstructions and MIPs were obtained to evaluate the vascular anatomy. RADIATION DOSE REDUCTION: This  exam was performed according to the departmental dose-optimization program which includes automated exposure control, adjustment of the mA and/or kV according to patient size and/or use of iterative reconstruction technique. CONTRAST:  OMNIPAQUE IOHEXOL 350 MG/ML SOLN COMPARISON:  07/10/2022 FINDINGS: Cardiovascular: No filling defect is identified in the pulmonary arterial tree to suggest pulmonary embolus. Coronary, aortic arch, and branch vessel atherosclerotic vascular disease. No thoracic aortic aneurysm or dissection is identified. Right Port-A-Cath tip: SVC. Because today's exam is timed for pulmonary arterial opacification, the systemic veins are not opacified and accordingly assessment of patency is limited. Mediastinum/Nodes: No obvious mass lesion impinging on the SVC, brachiocephalic vein, or left axillary vein. There is diffuse low-grade infiltrative edema in the mediastinum and also in the subcutaneous tissues bilaterally. Lungs/Pleura: Moderate to large bilateral pleural effusions with associated passive atelectasis. Upper Abdomen: Unremarkable Musculoskeletal: Thoracic spondylosis. Review of the MIP images confirms the above findings. IMPRESSION: 1. No filling defect is identified in the pulmonary arterial tree to suggest pulmonary embolus. 2. Moderate to large bilateral pleural effusions with associated passive atelectasis. 3. Diffuse low-grade infiltrative edema in the mediastinum and also in the subcutaneous tissues bilaterally, suggesting third spacing of fluid. 4. No obvious mass lesion impinging on the SVC, brachiocephalic vein, or left axillary vein. Please note that systemic venous structures are not opacified on today's exam, which was timed for pulmonary arterial opacification. Electronically Signed   By: Gaylyn Rong M.D.   On: 10/05/2022 14:08    Procedures Procedures    Medications Ordered in ED Medications  iohexol (OMNIPAQUE) 350 MG/ML injection 100 mL (100 mLs  Intravenous Contrast Given 10/05/22 1318)    ED Course/ Medical Decision Making/ A&P                             Medical Decision Making Amount and/or Complexity of Data Reviewed Labs: ordered. Radiology: ordered.  Risk Prescription drug management. Decision regarding hospitalization.    Angelica [name] is a 82 y.o. female with a past medical history significant for hypertension, GERD, asthma, hyperlipidemia, previous lung cancer, dementia, and hypothyroidism who presents from her PCP office this morning for further evaluation of several days of left arm swelling, subtle left face swelling, left neck swelling, severe fatigue, and shortness of breath.  According to patient, for the last few days she has had multiple problems including development of severe fatigue and shortness of breath at times.  She reports she has no energy to get around like she normally does.  She said that over the last few days she developed pain in her left shoulder and has developed swelling in her left arm.  She reports she is also having swelling  going into her left neck and left face that is subtle.  She denies any chest pain at this time but does have the shortness of breath.  She denies significant cough or congestion and denies fevers or chills.  She denies any nausea, vomiting, constipation, diarrhea, or urinary changes.  Denies any leg pain or leg swelling.  Saw her PCP today who wanted her sent here to rule out a thromboembolic etiology of the multiple areas of swelling and the shortness of breath troubles.  On exam, lungs were surprisingly clear.  Chest nontender.  Abdomen nontender.  She had radial pulses that were palpable bilaterally.  Intact sensation and strength.  Patient subjectively feels that she is swelling in her left face, left neck and left arm although it is not significantly seen on exam.  Legs nontender nonedematous.  Patient resting comfortably.  Vital signs on arrival are  reassuring.  Patient will get DVT ultrasound of the left arm, CT PE study, and CT soft tissue of the neck to rule out a DVT, in the arm, PE, SVC syndrome, or clot in the neck.  Will get screening labs as well and monitor her.  Will get EKG.  Anticipate reassessment after workup to determine disposition.  3:35 PM Patient's ultrasound showed no evidence of blood clot in the arm and CT scan showed no evidence of pulmonary embolism or clot in the neck.  It does show diffuse anasarca and mediastinal anasarca and pleural effusion.  On my reassessment to go over all of the findings, patient was found to be hypoxic with oxygen saturation of 88% on room air.  She is feeling short of breath still.  Will place her on oxygen and I feel she needs admission for shortness without hypoxia likely due to pleural effusion and anasarca.  Her albumin is unchanged at 3.0 and otherwise her BNP is not elevated.  Unclear etiology of all of her anasarca.  Troponin negative x 2.  TSH normal.  Rest of workup was similar to prior.  Will call for admission for new hypoxia and anasarca.  Unclear if she only albumin or other intervention.         Final Clinical Impression(s) / ED Diagnoses Final diagnoses:  Hypoxia  Peripheral edema  Pleural effusion  Anasarca  Shortness of breath    Clinical Impression: 1. Hypoxia   2. Peripheral edema   3. Pleural effusion   4. Anasarca   5. Shortness of breath     Disposition: Admit  This note was prepared with assistance of Dragon voice recognition software. Occasional wrong-word or sound-a-like substitutions may have occurred due to the inherent limitations of voice recognition software.      Laparis Durrett, Canary Brim, MD 10/05/22 [id number]

## 2022-10-05 NOTE — ED Notes (Signed)
While checking pt vitals, pt began throwing up white substance

## 2022-10-05 NOTE — Assessment & Plan Note (Addendum)
immunotherapy mediated hypothyroidism  TSH abnormal on 5/14 and her daughter started back her synthroid  TSH wnl today  Check T4 and continue synthroid

## 2022-10-05 NOTE — Assessment & Plan Note (Signed)
Can't r/o metastatic involvement lymphatics vs DVT LUE - pt is directed to ED for further evaluation

## 2022-10-05 NOTE — Progress Notes (Signed)
Patient ID: Angelica [name], female   DOB: 11-12-1940, 82 y.o.   MRN: [id number]         Chief Complaint:: wellness exam and Medical Management of Chronic Issues (Follw up from ER having weakness and left arm swelling ) , hx of lung cancer, recent abnormal CT chest feb 2024, htn       HPI:  Angelica [name] is a 82 y.o. female here for wellness exam; due for tdap and shingrx and prevnar 20 at the pharmacy, o/w up to date                        Also here in office wheelchair assisted by her daughter who has noted a 1 wk change in her general stamina, weakness, worsening new sob and doe, as well as vague somewhat subtle left neck / supraclavicular and LUE swelling to the hand associated with at least mild to mod discomfort and soreness to touch all over.  Pt denies chest pain, wheezing, orthopnea, PND, increased LE swelling, palpitations, dizziness or syncope.   Pt denies polydipsia, polyuria, or new focal neuro s/s.    Pt denies recent fever, wt loss, night sweats, loss of appetite, or other constitutional symptoms  Has hx of lung CA, with feb 2024 Ct chest with LUL ground glass appearance with recommendation for routine followup.     Wt Readings from Last 3 Encounters:  10/05/22 140 lb (63.5 kg)  10/01/22 136 lb (61.7 kg)  09/24/22 129 lb (58.5 kg)   BP Readings from Last 3 Encounters:  10/05/22 120/78  10/04/22 122/80  10/02/22 114/71   Immunization History  Administered Date(s) Administered   Fluad Quad(high Dose 65+) 01/16/2019, 03/05/2020, 02/01/2021, 02/26/2022   Influenza, High Dose Seasonal PF 01/30/2018   PFIZER Comirnaty(Gray Top)Covid-19 Tri-Sucrose Vaccine 02/23/2022   PFIZER(Purple Top)SARS-COV-2 Vaccination 07/13/2019, 08/03/2019, 02/16/2020   Pneumococcal Conjugate-13 11/08/2017   Health Maintenance Due  Topic Date Due   DTaP/Tdap/Td (1 - Tdap) Never done   Zoster Vaccines- Shingrix (1 of 2) Never done   Pneumonia Vaccine 39+ Years old (2 of 2 - PPSV23 or PCV20)  01/03/2018      Past Medical History:  Diagnosis Date   Arthritis    Asthma    Bowel obstruction (HCC)    Cancer (HCC)    Environmental allergies    GERD (gastroesophageal reflux disease)    Glaucoma 05/09/2017   H. pylori infection    History of radiation therapy 07/05/20-08/05/20   Left lung IMRT Dr. Roselind Messier    HLD (hyperlipidemia) 01/16/2019   Hypertension    Iron deficiency anemia    Osteopenia 05/26/2017   Thyroid disease    Urticaria 07/25/2018   Past Surgical History:  Procedure Laterality Date   ABDOMINAL HYSTERECTOMY     BOWEL RESECTION N/A 05/23/2020   Procedure: SMALL BOWEL REPAIR;  Surgeon: Sheliah Hatch, De Blanch, MD;  Location: [facility];  Service: General;  Laterality: N/A;   BREAST BIOPSY     BRONCHIAL BIOPSY  06/10/2020   Procedure: BRONCHIAL BIOPSIES;  Surgeon: [name], DO;  Location: [facility];  Service: Pulmonary;;   BRONCHIAL BRUSHINGS  06/10/2020   Procedure: BRONCHIAL BRUSHINGS;  Surgeon: [name], DO;  Location: [facility];  Service: Pulmonary;;   BRONCHIAL NEEDLE ASPIRATION BIOPSY  06/10/2020   Procedure: BRONCHIAL NEEDLE ASPIRATION BIOPSIES;  Surgeon: [name], DO;  Location: [facility];  Service: Pulmonary;;   BRONCHIAL WASHINGS  06/10/2020   Procedure:  BRONCHIAL WASHINGS;  Surgeon: [name], DO;  Location: [facility];  Service: Pulmonary;;   COLON SURGERY     DIAGNOSTIC LARYNGOSCOPY N/A 05/23/2020   Procedure: ATTEMPTED DIAGNOSTIC LAPAROSCOPY WITH ADHESIONS;  Surgeon: Sheliah Hatch, De Blanch, MD;  Location: [facility];  Service: General;  Laterality: N/A;   IR IMAGING GUIDED PORT INSERTION  07/15/2020   KNEE SURGERY     LAPAROTOMY N/A 04/18/2015   Procedure: Exploratory laparotomy with lysis of adhesions, possible bowel resection;  Surgeon: Axel Filler, MD;  Location: [facility];  Service: General;  Laterality: N/A;   LAPAROTOMY N/A 05/23/2020   Procedure: EXPLORATORY LAPAROTOMY;  Surgeon: Rodman Pickle, MD;  Location: [facility];   Service: General;  Laterality: N/A;   LYSIS OF ADHESION N/A 05/23/2020   Procedure: LYSIS OF ADHESION;  Surgeon: Sheliah Hatch De Blanch, MD;  Location: [facility];  Service: General;  Laterality: N/A;   NASAL SINUS SURGERY     THYROID SURGERY     VIDEO BRONCHOSCOPY WITH ENDOBRONCHIAL NAVIGATION Bilateral 06/10/2020   Procedure: VIDEO BRONCHOSCOPY WITH ENDOBRONCHIAL NAVIGATION;  Surgeon: [name], DO;  Location: [facility];  Service: Pulmonary;  Laterality: Bilateral;   VIDEO BRONCHOSCOPY WITH ENDOBRONCHIAL ULTRASOUND  06/10/2020   Procedure: VIDEO BRONCHOSCOPY WITH ENDOBRONCHIAL ULTRASOUND;  Surgeon: [name], DO;  Location: [facility];  Service: Pulmonary;;    reports that she has quit smoking. Her smoking use included cigarettes. She has never used smokeless tobacco. She reports that she does not drink alcohol and does not use drugs. family history includes Diabetes in her mother; Glaucoma in her brother and sister; Hypertension in her father and mother. Allergies  Allergen Reactions   Asa Buff (Mag [Buffered Aspirin] Other (See Comments)    Excessive sweating   Ensure [Nutritional Supplements]     Or boost - upset stomach    Fish Allergy Other (See Comments)    Unknown reaction, but allergic   Other Other (See Comments)    Gelcaps; gel-containing capsules; extended-release medication - upset stomach    Peanut-Containing Drug Products Other (See Comments)    From allergy test   Penicillins Itching    Has patient had a PCN reaction causing immediate rash, facial/tongue/throat swelling, SOB or lightheadedness with hypotension: No Has patient had a PCN reaction causing severe rash involving mucus membranes or skin necrosis: No Has patient had a PCN reaction that required hospitalization No Has patient had a PCN reaction occurring within the last 10 years: No If all of the above answers are "NO", then may proceed with Cephalosporin use.   Shellfish-Derived Products Other (See  Comments)    From allergy test   Strawberry Extract Other (See Comments)    From allergy test    Current Outpatient Medications on File Prior to Visit  Medication Sig Dispense Refill   acetaminophen (TYLENOL) 500 MG tablet Take 500 mg by mouth every 6 (six) hours as needed for moderate pain.     albuterol (VENTOLIN HFA) 108 (90 Base) MCG/ACT inhaler Inhale 2 puffs into the lungs every 4 (four) hours as needed for wheezing or shortness of breath. 18 g 1   alum & mag hydroxide-simeth (MAALOX/MYLANTA) 200-200-20 MG/5ML suspension Take 15 mLs by mouth every 6 (six) hours as needed for indigestion or heartburn.     amLODipine (NORVASC) 10 MG tablet Take 1 tablet by mouth once daily 90 tablet 0   Benralizumab (FASENRA) 30 MG/ML SOSY Inject 1 mL (30 mg total) into the skin every 8 (eight) weeks.  1 mL 6   benzonatate (TESSALON) 100 MG capsule Take 1 capsule (100 mg total) by mouth at bedtime as needed for cough. 30 capsule 0   clopidogrel (PLAVIX) 75 MG tablet Take 1 tablet by mouth once daily 90 tablet 3   diclofenac Sodium (VOLTAREN) 1 % GEL Apply 4 g topically 4 (four) times daily. 100 g 0   EPINEPHrine (EPIPEN 2-PAK) 0.3 mg/0.3 mL IJ SOAJ injection Use as directed for severe allergic reaction 2 each 1   EQ ALLERGY RELIEF, CETIRIZINE, 10 MG tablet Take 1 tablet by mouth once daily 90 tablet 0   fluticasone (FLONASE) 50 MCG/ACT nasal spray Place 1 spray into both nostrils in the morning and at bedtime. 16 g 5   ipratropium (ATROVENT) 0.06 % nasal spray Place 2 sprays into both nostrils 4 (four) times daily as needed (nasal drainage). 15 mL 5   linaclotide (LINZESS) 72 MCG capsule TAKE 1 CAPSULE BY MOUTH ONCE DAILY AS NEEDED 30 capsule 0   Magnesium Hydroxide (PHILLIPS MILK OF MAGNESIA PO) Take 15-30 mLs by mouth daily as needed (for constipation).     memantine (NAMENDA) 10 MG tablet Take 1 tablet (10 mg total) by mouth 2 (two) times daily. 60 tablet 5   montelukast (SINGULAIR) 10 MG tablet Take 1  tablet (10 mg total) by mouth at bedtime. 30 tablet 5   ondansetron (ZOFRAN-ODT) 4 MG disintegrating tablet Take 1 tablet (4 mg total) by mouth every 8 (eight) hours as needed for nausea or vomiting. 10 tablet 0   pantoprazole (PROTONIX) 40 MG tablet Take 1 tablet (40 mg total) by mouth daily. 90 tablet 1   polyethylene glycol (MIRALAX / GLYCOLAX) 17 g packet Take 17 g by mouth daily as needed for mild constipation.     pravastatin (PRAVACHOL) 80 MG tablet Take 1 tablet by mouth once daily 90 tablet 0   SYMBICORT 80-4.5 MCG/ACT inhaler INHALE 2 PUFFS BY MOUTH IN THE MORNING AND AT BEDTIME 10.2 g 5   Current Facility-Administered Medications on File Prior to Visit  Medication Dose Route Frequency Provider Last Rate Last Admin   Benralizumab SOSY 30 mg  30 mg Subcutaneous Q8 Weeks Marcelyn Bruins, MD   30 mg at 08/22/22 1420        ROS:  All others reviewed and negative.  Objective        PE:  BP 120/78 (BP Location: Right Arm, Patient Position: Sitting, Cuff Size: Normal)   Pulse 85   Temp 97.8 F (36.6 C) (Oral)   Ht 5\' 2"  (1.575 m)   Wt 140 lb (63.5 kg)   SpO2 96%   BMI 25.61 kg/m                 Constitutional: Pt appears in NAD               HENT: Head: NCAT.                Right Ear: External ear normal.                 Left Ear: External ear normal.                Eyes: . Pupils are equal, round, and reactive to light. Conjunctivae and EOM are normal               Nose: without d/c or deformity               Neck:  Neck supple. Gross normal ROM               Cardiovascular: Normal rate and regular rhythm.                 Pulmonary/Chest: Effort normal and breath sounds without rales or wheezing.                Abd:  Soft, NT, ND, + BS, no organomegaly               Neurological: Pt is alert. At baseline orientation, motor grossly intact               Skin: Skin is warm. No rashes, no other new lesions, LE edema - none, but has at least mild to mod fullness and  swelling to the left neck, supraclavicular and LUE to the hand, possible shotty LN to the left axilla               Psychiatric: Pt behavior is normal without agitation   Micro: none  Cardiac tracings I have personally interpreted today:  none  Pertinent Radiological findings (summarize): none   Lab Results  Component Value Date   WBC 6.5 10/02/2022   HGB 11.0 (L) 10/02/2022   HCT 34.4 (L) 10/02/2022   PLT 243 10/02/2022   GLUCOSE 119 (H) 10/02/2022   CHOL 191 07/18/2021   TRIG 83.0 07/18/2021   HDL 77.50 07/18/2021   LDLCALC 97 07/18/2021   ALT 10 10/02/2022   AST 27 10/02/2022   NA 134 (L) 10/02/2022   K 4.1 10/02/2022   CL 104 10/02/2022   CREATININE 0.81 10/02/2022   BUN 11 10/02/2022   CO2 22 10/02/2022   TSH 7.677 (H) 10/02/2022   INR 1.0 11/02/2020   HGBA1C 5.8 07/18/2021   Assessment/Plan:  Angelica [name] is a 82 y.o. Black or African American [2] female with  has a past medical history of Arthritis, Asthma, Bowel obstruction (HCC), Cancer (HCC), Environmental allergies, GERD (gastroesophageal reflux disease), Glaucoma (05/09/2017), H. pylori infection, History of radiation therapy (07/05/20-08/05/20), HLD (hyperlipidemia) (01/16/2019), Hypertension, Iron deficiency anemia, Osteopenia (05/26/2017), Thyroid disease, and Urticaria (07/25/2018).  Encounter for well adult exam with abnormal findings Age and sex appropriate education and counseling updated with regular exercise and diet Referrals for preventative services - none needed Immunizations addressed - for shingrix, tdap, prevnar 20 at the pharmacy Smoking counseling  - none needed Evidence for depression or other mood disorder - none significant Most recent labs reviewed. I have personally reviewed and have noted: 1) the patient's medical and social history 2) The patient's current medications and supplements 3) The patient's height, weight, and BMI have been recorded in the chart   HTN (hypertension) BP  Readings from Last 3 Encounters:  10/05/22 120/78  10/04/22 122/80  10/02/22 114/71   Stable, pt to continue medical treatment norvasc 10 qd   Dementia (HCC) Overall stable, cont current med tx, no bahavioral issues  Hypothyroidism Did have recent tsh slightly worsening 5.14 but will hold on any med changes for now given current need for further evalatuion and tx  Left arm swelling Can't r/o metastatic involvement lymphatics vs DVT LUE - pt is directed to ED for further evaluation  Followup: Return in about 4 weeks (around 11/02/2022).  Oliver Barre, MD 10/05/2022 8:55 AM [facility]

## 2022-10-05 NOTE — Assessment & Plan Note (Signed)
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 On plavix and statin

## 2022-10-05 NOTE — Assessment & Plan Note (Addendum)
82 year old female presenting with 2 week history of worsening fatigue and weakness found to have edema in face/left arm at PCP and sent to ED where patient was hypoxic on room air to 88% and found to have large bilateral pleural effusions and diffuse third spacing of fluids  -obs to tele  -oxygen turned off while in room and sats stayed 99-100% on RA -likely secondary to large pleural effusions. Troponin/BNP wnl  -IR consult for therapeutic and diagnostic thoracentesis, labs ordered  -wean as tolerated

## 2022-10-05 NOTE — Assessment & Plan Note (Signed)
Age and sex appropriate education and counseling updated with regular exercise and diet Referrals for preventative services - none needed Immunizations addressed - for shingrix, tdap, prevnar 20 at the pharmacy Smoking counseling  - none needed Evidence for depression or other mood disorder - none significant Most recent labs reviewed. I have personally reviewed and have noted: 1) the patient's medical and social history 2) The patient's current medications and supplements 3) The patient's height, weight, and BMI have been recorded in the chart

## 2022-10-05 NOTE — Assessment & Plan Note (Signed)
Overall stable, cont current med tx, no bahavioral issues

## 2022-10-05 NOTE — Assessment & Plan Note (Signed)
BP Readings from Last 3 Encounters:  10/05/22 120/78  10/04/22 122/80  10/02/22 114/71   Stable, pt to continue medical treatment norvasc 10 qd

## 2022-10-05 NOTE — Assessment & Plan Note (Signed)
Continue PPI ?

## 2022-10-05 NOTE — Assessment & Plan Note (Signed)
Well controlled, continue norvasc 10mg  daily

## 2022-10-05 NOTE — Assessment & Plan Note (Signed)
Moderate to large bilateral pleural effusions seen on CTA chest  Hx of lung cancer and new finding with hypoxia IR consulted for diagnostic/therapeutic thoracentesis  Work up for edema/anasarca

## 2022-10-05 NOTE — Assessment & Plan Note (Addendum)
Delirium precautions/fall precautions  Continue namenda

## 2022-10-05 NOTE — Patient Instructions (Addendum)
Please have your Shingrix (shingles) shots done at your local pharmacy., and theTdap tetanus shot  We can hold on the Prevnar 20 shot today for now  Please continue all other medications as before, and refills have been done if requested.  Please have the pharmacy call with any other refills you may need.  Please continue your efforts at being more active, low cholesterol diet, and weight control.  You are otherwise up to date with prevention measures today.  Please keep your appointments with your specialists as you may have planned  Please go to ED for sob and debility and left arm /left neck swelling I suspect may mean the lung cancer has returned or there is blood clot in the left arm and shoulder area  Please make an Appointment to return in 1 months, or sooner if needed

## 2022-10-05 NOTE — Assessment & Plan Note (Signed)
S/p chemo and radiation. Followed q 6 months Finished her chemo in 2022

## 2022-10-06 ENCOUNTER — Observation Stay (HOSPITAL_COMMUNITY): Payer: Medicare HMO

## 2022-10-06 DIAGNOSIS — Z7902 Long term (current) use of antithrombotics/antiplatelets: Secondary | ICD-10-CM | POA: Diagnosis not present

## 2022-10-06 DIAGNOSIS — R9431 Abnormal electrocardiogram [ECG] [EKG]: Secondary | ICD-10-CM | POA: Diagnosis not present

## 2022-10-06 DIAGNOSIS — R0902 Hypoxemia: Secondary | ICD-10-CM | POA: Diagnosis not present

## 2022-10-06 DIAGNOSIS — H409 Unspecified glaucoma: Secondary | ICD-10-CM | POA: Diagnosis present

## 2022-10-06 DIAGNOSIS — Z90710 Acquired absence of both cervix and uterus: Secondary | ICD-10-CM | POA: Diagnosis not present

## 2022-10-06 DIAGNOSIS — Z79899 Other long term (current) drug therapy: Secondary | ICD-10-CM | POA: Diagnosis not present

## 2022-10-06 DIAGNOSIS — E032 Hypothyroidism due to medicaments and other exogenous substances: Secondary | ICD-10-CM | POA: Diagnosis present

## 2022-10-06 DIAGNOSIS — J9601 Acute respiratory failure with hypoxia: Secondary | ICD-10-CM | POA: Diagnosis present

## 2022-10-06 DIAGNOSIS — R0602 Shortness of breath: Secondary | ICD-10-CM | POA: Diagnosis present

## 2022-10-06 DIAGNOSIS — Z87891 Personal history of nicotine dependence: Secondary | ICD-10-CM | POA: Diagnosis not present

## 2022-10-06 DIAGNOSIS — J90 Pleural effusion, not elsewhere classified: Secondary | ICD-10-CM | POA: Diagnosis present

## 2022-10-06 DIAGNOSIS — K219 Gastro-esophageal reflux disease without esophagitis: Secondary | ICD-10-CM | POA: Diagnosis present

## 2022-10-06 DIAGNOSIS — E872 Acidosis, unspecified: Secondary | ICD-10-CM | POA: Diagnosis present

## 2022-10-06 DIAGNOSIS — I10 Essential (primary) hypertension: Secondary | ICD-10-CM | POA: Diagnosis present

## 2022-10-06 DIAGNOSIS — M8580 Other specified disorders of bone density and structure, unspecified site: Secondary | ICD-10-CM | POA: Diagnosis present

## 2022-10-06 DIAGNOSIS — J4550 Severe persistent asthma, uncomplicated: Secondary | ICD-10-CM | POA: Diagnosis present

## 2022-10-06 DIAGNOSIS — J9811 Atelectasis: Secondary | ICD-10-CM | POA: Diagnosis present

## 2022-10-06 DIAGNOSIS — C3492 Malignant neoplasm of unspecified part of left bronchus or lung: Secondary | ICD-10-CM | POA: Diagnosis present

## 2022-10-06 DIAGNOSIS — F0390 Unspecified dementia without behavioral disturbance: Secondary | ICD-10-CM | POA: Diagnosis present

## 2022-10-06 DIAGNOSIS — Z923 Personal history of irradiation: Secondary | ICD-10-CM | POA: Diagnosis not present

## 2022-10-06 DIAGNOSIS — D72829 Elevated white blood cell count, unspecified: Secondary | ICD-10-CM | POA: Diagnosis not present

## 2022-10-06 DIAGNOSIS — D509 Iron deficiency anemia, unspecified: Secondary | ICD-10-CM | POA: Diagnosis present

## 2022-10-06 DIAGNOSIS — E785 Hyperlipidemia, unspecified: Secondary | ICD-10-CM | POA: Diagnosis present

## 2022-10-06 DIAGNOSIS — R109 Unspecified abdominal pain: Secondary | ICD-10-CM | POA: Diagnosis not present

## 2022-10-06 DIAGNOSIS — Z7951 Long term (current) use of inhaled steroids: Secondary | ICD-10-CM | POA: Diagnosis not present

## 2022-10-06 DIAGNOSIS — E876 Hypokalemia: Secondary | ICD-10-CM | POA: Diagnosis not present

## 2022-10-06 DIAGNOSIS — Z79890 Hormone replacement therapy (postmenopausal): Secondary | ICD-10-CM | POA: Diagnosis not present

## 2022-10-06 DIAGNOSIS — Z9221 Personal history of antineoplastic chemotherapy: Secondary | ICD-10-CM | POA: Diagnosis not present

## 2022-10-06 LAB — CBC WITH DIFFERENTIAL/PLATELET
Abs Immature Granulocytes: 0.18 10*3/uL — ABNORMAL HIGH (ref 0.00–0.07)
Basophils Absolute: 0.1 10*3/uL (ref 0.0–0.1)
Basophils Relative: 0 %
Eosinophils Absolute: 0 10*3/uL (ref 0.0–0.5)
Eosinophils Relative: 0 %
HCT: 32.9 % — ABNORMAL LOW (ref 36.0–46.0)
Hemoglobin: 10.8 g/dL — ABNORMAL LOW (ref 12.0–15.0)
Immature Granulocytes: 1 %
Lymphocytes Relative: 6 %
Lymphs Abs: 1.6 10*3/uL (ref 0.7–4.0)
MCH: 27.8 pg (ref 26.0–34.0)
MCHC: 32.8 g/dL (ref 30.0–36.0)
MCV: 84.6 fL (ref 80.0–100.0)
Monocytes Absolute: 1.2 10*3/uL — ABNORMAL HIGH (ref 0.1–1.0)
Monocytes Relative: 5 %
Neutro Abs: 22 10*3/uL — ABNORMAL HIGH (ref 1.7–7.7)
Neutrophils Relative %: 88 %
Platelets: 236 10*3/uL (ref 150–400)
RBC: 3.89 MIL/uL (ref 3.87–5.11)
RDW: 17.2 % — ABNORMAL HIGH (ref 11.5–15.5)
WBC: 25 10*3/uL — ABNORMAL HIGH (ref 4.0–10.5)
nRBC: 0 % (ref 0.0–0.2)

## 2022-10-06 LAB — BASIC METABOLIC PANEL
Anion gap: 8 (ref 5–15)
BUN: 10 mg/dL (ref 8–23)
CO2: 23 mmol/L (ref 22–32)
Calcium: 8.3 mg/dL — ABNORMAL LOW (ref 8.9–10.3)
Chloride: 104 mmol/L (ref 98–111)
Creatinine, Ser: 0.86 mg/dL (ref 0.44–1.00)
GFR, Estimated: >60 mL/min (ref >60)
Glucose, Bld: 99 mg/dL (ref 70–99)
Potassium: 3.7 mmol/L (ref 3.5–5.1)
Sodium: 135 mmol/L (ref 135–145)

## 2022-10-06 LAB — T4, FREE: Free T4: 1.14 ng/dL — ABNORMAL HIGH (ref 0.61–1.12)

## 2022-10-06 LAB — CBC
HCT: 32.9 % — ABNORMAL LOW (ref 36.0–46.0)
Hemoglobin: 11 g/dL — ABNORMAL LOW (ref 12.0–15.0)
MCH: 28.3 pg (ref 26.0–34.0)
MCHC: 33.4 g/dL (ref 30.0–36.0)
MCV: 84.6 fL (ref 80.0–100.0)
Platelets: 234 10*3/uL (ref 150–400)
RBC: 3.89 MIL/uL (ref 3.87–5.11)
RDW: 17 % — ABNORMAL HIGH (ref 11.5–15.5)
WBC: 31.8 10*3/uL — ABNORMAL HIGH (ref 4.0–10.5)
nRBC: 0 % (ref 0.0–0.2)

## 2022-10-06 MED ORDER — SODIUM CHLORIDE 0.9% FLUSH: 10.0000 mL | INTRAVENOUS | Status: DC | PRN

## 2022-10-06 MED ORDER — LEVOTHYROXINE SODIUM 50 MCG PO TABS
50.0000 ug | ORAL_TABLET | Freq: Every day | ORAL | Status: DC
  Administered 2022-10-07 – 2022-10-09 (×3): 50 ug via ORAL
  Filled 2022-10-06 (×3): qty 1

## 2022-10-06 MED ORDER — SODIUM CHLORIDE 0.9 % IV SOLN
2.0000 g | INTRAVENOUS | Status: DC
  Administered 2022-10-06 – 2022-10-09 (×4): 2 g via INTRAVENOUS
  Filled 2022-10-06 (×4): qty 20

## 2022-10-06 MED ORDER — SODIUM CHLORIDE 0.9 % IV SOLN
500.0000 mg | INTRAVENOUS | Status: DC
  Administered 2022-10-06 – 2022-10-07 (×2): 500 mg via INTRAVENOUS
  Filled 2022-10-06 (×3): qty 5

## 2022-10-06 NOTE — Progress Notes (Addendum)
PROGRESS NOTE    Angelica [name]  JXB:[id number] DOB: Jun 10, 1940 DOA: 10/05/2022 PCP: [name], MD   Brief Narrative: 82 year old with past medical history significant for GERD, hypertension, hyperlipidemia, IDA, asthma, stage III adenocarcinoma of the lung, hypothyroidism, dementia who was sent to the ED from her PCP office due to swelling of the left arm and face.  Symptoms started a week prior to admission, with swelling of the left eye.  Thought to be related to allergies.  She presented to the ED on 5/14 because she was not feeling well yet only abnormal finding was high TSH.  Patient has been off of her Synthroid and this was resumed.    Evaluation in the ED CTA chest negative for PE,  moderate to large bilateral pleural effusion with associated passive atelectasis.  Diffuse low-grade infiltrative edema in the mediastinum and also in the SVC tissue suggesting there is spacing of fluid.  CT soft tissue neck showed diffuse anasarca  which may be slightly more asymmetric in the left neck compared to the right. No definite underlying lesion is visualized. Small bilateral pleural effusions, new from prior exam  Assessment & Plan:   Principal Problem:   Acute respiratory failure with hypoxia (HCC) Active Problems:   Edema   Bilateral pleural effusion   Weakness   Hypothyroidism   Adenocarcinoma of left lung, stage 3 (HCC)   Asthma, severe persistent, well-controlled   HTN (hypertension)   Iron deficiency   History of stroke   Dementia (HCC)   HLD (hyperlipidemia)   GERD (gastroesophageal reflux disease)   Other allergic rhinitis  1-Acute hypoxic respiratory failure: Bilateral pleural effusion, worse left  -Patient presents with 2 weeks history of worsening fatigue, found to have edema face left arm, in the ED she was found to be hypoxic oxygen sat 88 on room air.  Patient with bilateral pleural effusion and diffuse third spacing of fluid. -IR consulted for thoracentesis. IR will  be able to do thoracentesis tomorrow.  -She develops leukocytosis. Will cover for PNA, started IV ceftriaxone and Azithromycin.  -[name].  -BP 120--100--- will hold on IV lasix today.   Edema, anasarca: -CTA chest: Suggestive edema in mediastinum  suggesting third spacing of fluid.  -CT soft tissue of neck with no seen obstruction, diffuse anasarca. No obstruction of venous system  -Hold on Lasix due to soft BP.   Weakness: TSH normal limits.  Needs PT>  B 12: order for tomorrow  Hypothyroidism: TSH now normal, was elevated 4/18 Per daughter patient is on Synthroid 50 mcg, dose adjusted.   Adenocarcinoma of the left [place] stage III: Status post chemo and radiation.  Initial chemo in 2022 Continue follow-up outpatient Presents with pleural effusion, will send for Cytology   Asthma, severe persistent, well-controlled: On Dulera, Nebulizer.  On Benralizumab q8 weeks   Hypertension: Hold Norvasc, to see if we can do diuresis.   Iron deficiency anemia: Monitor hb.   History  stroke: On Plavix.   Dementia: Continue with Namenda.   Hyperlipidemia: On Pravachol   GERD:  PPI.   Other allergic rhinitis Continue flonase/zyrtec daily   Leucocytosis;  Will cover for PNA.  Thoracentesis tomorrow  Denies abdominal pain.  Will proceed with Blood culture.   Estimated body mass index is 26.89 kg/m as calculated from the following:   Height as of this encounter: 5\' 2"  (1.575 m).   Weight as of this encounter: 66.7 kg.   DVT prophylaxis: SCD Code Status:  Full code Family Communication: Care discussed with daughter  Disposition Plan:  Status is: Observation The patient remains OBS appropriate and will d/c before 2 midnights.    Consultants:  IR  Procedures:    Antimicrobials:    Subjective: She is alert, denies worsening dyspnea, no abdominal pain.  Per daughter patient patient takes 50 mcg synthroid.  Gets Benralizumab q8 weeks for  allergies.  Edema face and arm slight improved.  Daughter with concern for irregular heart rhythm--- EKG repeated showed sinus rhythm.   Objective: Vitals:   10/05/22 1631 10/05/22 2009 10/06/22 0058 10/06/22 0520  BP: 119/71 125/71 113/67 113/62  Pulse: (!) 107 92 81 85  Resp: (!) 22 20 18 17   Temp: 98.3 F (36.8 C) 98.2 F (36.8 C) 98 F (36.7 C) 98.2 F (36.8 C)  TempSrc: Oral Oral Oral Oral  SpO2: 97% 100% 98% 99%  Weight:      Height:        Intake/Output Summary (Last 24 hours) at 10/06/2022 0701 Last data filed at 10/06/2022 0520 Gross per 24 hour  Intake --  Output 400 ml  Net -400 ml   Filed Weights   10/05/22 0909  Weight: 66.7 kg    Examination:  General exam: Appears calm and comfortable  Respiratory system: Respiratory effort normal. Crackles BL. No wheezing Cardiovascular system: S1 & S2 heard, RRR. No JVD, murmurs, rubs, gallops or clicks. No pedal edema. Gastrointestinal system: Abdomen is nondistended, soft and nontender. No organomegaly or masses felt. Normal bowel sounds heard. Central nervous system: Alert and oriented.  Extremities: Symmetric 5 x 5 power. No edema   Data Reviewed: I have personally reviewed following labs and imaging studies  CBC: Recent Labs  Lab 10/02/22 0930 10/05/22 1040 10/06/22 0338  WBC 6.5 8.0 31.8*  NEUTROABS 4.7 6.1  --   HGB 11.0* 11.3* 11.0*  HCT 34.4* 34.9* 32.9*  MCV 84.9 85.7 84.6  PLT 243 248 234   Basic Metabolic Panel: Recent Labs  Lab 10/02/22 0930 10/05/22 1040 10/06/22 0338  NA 134* 137 135  K 4.1 3.7 3.7  CL 104 106 104  CO2 22 23 23   GLUCOSE 119* 94 99  BUN 11 8 10   CREATININE 0.81 0.66 0.86  CALCIUM 8.6* 8.6* 8.3*   GFR: Estimated Creatinine Clearance: 45.1 mL/min (by C-G formula based on SCr of 0.86 mg/dL). Liver Function Tests: Recent Labs  Lab 10/02/22 0930 10/05/22 1040  AST 27 15  ALT 10 12  ALKPHOS 65 65  BILITOT 1.1 0.7  PROT 7.5 7.0  ALBUMIN 3.0* 3.0*   Recent  Labs  Lab 10/02/22 0930 10/05/22 2104  LIPASE 35 35   No results for input(s): "AMMONIA" in the last 168 hours. Coagulation Profile: No results for input(s): "INR", "PROTIME" in the last 168 hours. Cardiac Enzymes: No results for input(s): "CKTOTAL", "CKMB", "CKMBINDEX", "TROPONINI" in the last 168 hours. BNP (last 3 results) No results for input(s): "PROBNP" in the last 8760 hours. HbA1C: No results for input(s): "HGBA1C" in the last 72 hours. CBG: No results for input(s): "GLUCAP" in the last 168 hours. Lipid Profile: No results for input(s): "CHOL", "HDL", "LDLCALC", "TRIG", "CHOLHDL", "LDLDIRECT" in the last 72 hours. Thyroid Function Tests: Recent Labs    10/05/22 1040 10/05/22 2104  TSH 3.632  --   FREET4  --  1.14*   Anemia Panel: No results for input(s): "VITAMINB12", "FOLATE", "FERRITIN", "TIBC", "IRON", "RETICCTPCT" in the last 72 hours. Sepsis Labs: Recent Labs  Lab 10/05/22  1524 10/05/22 1759  LATICACIDVEN 2.7* 1.4    No results found for this or any previous visit (from the past 240 hour(s)).       Radiology Studies: DG Abd 1 View  Result Date: 10/06/2022 CLINICAL DATA:  82 year old female with generalized stomach pain. EXAM: ABDOMEN - 1 VIEW COMPARISON:  CTA chest 10/05/2022. CT Abdomen and Pelvis 10/19/2021. FINDINGS: AP view at 0531 hours. Bilateral pleural effusions better demonstrated by CTA yesterday. Excreted IV contrast in the urinary bladder. Non obstructed bowel gas pattern. Degenerative changes in the spine. No acute osseous abnormality identified. IMPRESSION: Non obstructed bowel gas pattern. Electronically Signed   By: Odessa Fleming M.D.   On: 10/06/2022 06:00   UE Venous Duplex ([facility] and [facility] ONLY)  Result Date: 10/05/2022 UPPER VENOUS STUDY  Patient Name:  DANNELLE BARGERSTOCK  Date of Exam:   10/05/2022 Medical Rec #: [id number]         Accession #:    [id number] Date of Birth: 1940-06-26          Patient Gender: F Patient Age:   27 years Exam Location:   [facility] Procedure:      VAS [place] UPPER EXTREMITY VENOUS DUPLEX Referring Phys: Lynden Oxford --------------------------------------------------------------------------------  Indications: Left arm, face, and hand swelling Risk Factors: HX of Lung CA. Comparison Study: No previosu exams Performing Technologist: Jody Hill RVT, RDMS  Examination Guidelines: A complete evaluation includes B-mode imaging, spectral Doppler, color Doppler, and power Doppler as needed of all accessible portions of each vessel. Bilateral testing is considered an integral part of a complete examination. Limited examinations for reoccurring indications may be performed as noted.  Right Findings: +----------+------------+---------+-----------+----------+-------+ RIGHT     CompressiblePhasicitySpontaneousPropertiesSummary +----------+------------+---------+-----------+----------+-------+ Subclavian               Yes       Yes                      +----------+------------+---------+-----------+----------+-------+ Limited visualization of subclavian due to port placement patent by color and doppler  Left Findings: +----------+------------+---------+-----------+----------+-------+ LEFT      CompressiblePhasicitySpontaneousPropertiesSummary +----------+------------+---------+-----------+----------+-------+ IJV           Full       Yes       Yes                      +----------+------------+---------+-----------+----------+-------+ Subclavian    Full       Yes       Yes                      +----------+------------+---------+-----------+----------+-------+ Axillary      Full       Yes       Yes                      +----------+------------+---------+-----------+----------+-------+ Brachial      Full       Yes       Yes                      +----------+------------+---------+-----------+----------+-------+ Radial        Full                                           +----------+------------+---------+-----------+----------+-------+ Ulnar  Full                                          +----------+------------+---------+-----------+----------+-------+ Cephalic      Full                                          +----------+------------+---------+-----------+----------+-------+ Basilic       Full       Yes       Yes                      +----------+------------+---------+-----------+----------+-------+  Summary:  Right: No evidence of thrombosis in the subclavian.  Left: No evidence of deep vein thrombosis in the upper extremity. No evidence of superficial vein thrombosis in the upper extremity.  *See table(s) above for measurements and observations.  Diagnosing physician: Gerarda Fraction Electronically signed by Gerarda Fraction on 10/05/2022 at 2:56:59 PM.    Final    CT Soft Tissue Neck W Contrast  Result Date: 10/05/2022 CLINICAL DATA:  Neck mass, nonpulsatile concern for left neck and face swelling EXAM: CT NECK WITH CONTRAST TECHNIQUE: Multidetector CT imaging of the neck was performed using the standard protocol following the bolus administration of intravenous contrast. RADIATION DOSE REDUCTION: This exam was performed according to the departmental dose-optimization program which includes automated exposure control, adjustment of the mA and/or kV according to patient size and/or use of iterative reconstruction technique. CONTRAST:  OMNIPAQUE IOHEXOL 350 MG/ML SOLN COMPARISON:  None Available. FINDINGS: Limitations: Assessment is slightly limited due to motion artifact of the level of the oral and hypopharynx. Pharynx and larynx: The epiglottis is normal in appearance. Bilateral tonsils are normal in appearance. No evidence of parapharyngeal space soft tissue stranding. Salivary glands: No inflammation, mass, or stone. Thyroid: Heterogeneous and multinodular left thyroid lobe, not significantly changed compared to prior CT chest dated  04/11/2019. Lymph nodes: Within the limitations of motion artifact, no definite cervical lymphadenopathy is visualized. Vascular: Negative. Limited intracranial: Incompletely imaged Visualized orbits: Not imaged Mastoids and visualized paranasal sinuses: No middle ear or mastoid effusion. There is mucosal thickening in the bilateral maxillary sinuses. Skeleton: Straightening of the normal cervical lordosis. No suspicious osseous lesions. Upper chest: Small bilateral pleural effusions, new from prior exam. Right chest port in place with tip visualized to the level of the mid to lower SVC. See separately dictated chest for findings below the thoracic inlet. Other: There is diffuse anasarca there may be slightly more asymmetric in the left neck compared to the right. No definite underlying lesion is visualized. IMPRESSION: 1. Diffuse anasarca, which may be slightly more asymmetric in the left neck compared to the right. No definite underlying lesion is visualized. 2. Small bilateral pleural effusions, new from prior exam. See separately dictated chest for findings below the thoracic inlet. Electronically Signed   By: Lorenza Cambridge M.D.   On: 10/05/2022 14:27   CT Angio Chest PE W and/or Wo Contrast  Result Date: 10/05/2022 CLINICAL DATA:  Left lower neck swelling. Cancer history. Query pulmonary embolus or SVC syndrome. EXAM: CT ANGIOGRAPHY CHEST WITH CONTRAST TECHNIQUE: Multidetector CT imaging of the chest was performed using the standard protocol during bolus administration of intravenous contrast. Multiplanar CT image reconstructions and MIPs were obtained to evaluate the vascular anatomy.  RADIATION DOSE REDUCTION: This exam was performed according to the departmental dose-optimization program which includes automated exposure control, adjustment of the mA and/or kV according to patient size and/or use of iterative reconstruction technique. CONTRAST:  OMNIPAQUE IOHEXOL 350 MG/ML SOLN COMPARISON:   07/10/2022 FINDINGS: Cardiovascular: No filling defect is identified in the pulmonary arterial tree to suggest pulmonary embolus. Coronary, aortic arch, and branch vessel atherosclerotic vascular disease. No thoracic aortic aneurysm or dissection is identified. Right Port-A-Cath tip: SVC. Because today's exam is timed for pulmonary arterial opacification, the systemic veins are not opacified and accordingly assessment of patency is limited. Mediastinum/Nodes: No obvious mass lesion impinging on the SVC, brachiocephalic vein, or left axillary vein. There is diffuse low-grade infiltrative edema in the mediastinum and also in the subcutaneous tissues bilaterally. Lungs/Pleura: Moderate to large bilateral pleural effusions with associated passive atelectasis. Upper Abdomen: Unremarkable Musculoskeletal: Thoracic spondylosis. Review of the MIP images confirms the above findings. IMPRESSION: 1. No filling defect is identified in the pulmonary arterial tree to suggest pulmonary embolus. 2. Moderate to large bilateral pleural effusions with associated passive atelectasis. 3. Diffuse low-grade infiltrative edema in the mediastinum and also in the subcutaneous tissues bilaterally, suggesting third spacing of fluid. 4. No obvious mass lesion impinging on the SVC, brachiocephalic vein, or left axillary vein. Please note that systemic venous structures are not opacified on today's exam, which was timed for pulmonary arterial opacification. Electronically Signed   By: Gaylyn Rong M.D.   On: 10/05/2022 14:08        Scheduled Meds:  amLODipine  10 mg Oral Daily   Chlorhexidine Gluconate Cloth  6 each Topical Daily   clopidogrel  75 mg Oral Daily   fluticasone  1 spray Each Nare BID   levothyroxine  25 mcg Oral QAC breakfast   loratadine  10 mg Oral Daily   memantine  10 mg Oral BID   mometasone-formoterol  2 puff Inhalation BID   pantoprazole  40 mg Oral Daily   pravastatin  80 mg Oral Daily   sodium  chloride flush  3 mL Intravenous Q12H   Continuous Infusions:  sodium chloride       LOS: 0 days    Time spent: 35 minutes.     Alba Cory, MD [facility]   If 7PM-7AM, please contact night-coverage [email or web address]  10/06/2022, 7:01 AM

## 2022-10-06 NOTE — Evaluation (Signed)
Physical Therapy Evaluation Patient Details Name: Angelica [name] MRN: [id number] DOB: 05/23/1940 Today's Date: 10/06/2022  History of Present Illness  Pt is an 81yo female presenting to [facility] ED on 5/17 from PCP office secondary to swelling of left arm and face. 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 Of note pt with recent ED visit 5/14 for fatigue and was discharged without admission with recommendation to see PCP.  PMH: GERD, HTN, HLD, asthma, stage 3 adenocarcinoma of the lung s/p radiation, hypothyroidism, dementia.   Clinical Impression  Pt received supine in bed with multiple family members present agreeable to be seen. Pt A&Ox4. Pt demonstrated modified independence with bed mobility and transfers, ambulated both with and without AD, recommended that pt utilize 4WRW and walk with family during recovery period to allow for rest breaks during ambulation and for increased safety/supervision, verbalized understanding. SpO2 monitored, 95% with HR90 during ambulation. Adding to mobility specialist caseload and we will continue to follow acutely.     Recommendations for follow up therapy are one component of a multi-disciplinary discharge planning process, led by the attending physician.  Recommendations may be updated based on patient status, additional functional criteria and insurance authorization.  Follow Up Recommendations       Assistance Recommended at Discharge Intermittent Supervision/Assistance  Patient can return home with the following  A little help with walking and/or transfers;A little help with bathing/dressing/bathroom;Assistance with cooking/housework;Direct supervision/assist for financial management;Direct supervision/assist for medications management;Assist for transportation;Help with stairs or ramp for entrance    Equipment Recommendations None  recommended by PT (Use DME at home.)  Recommendations for Other Services       Functional Status Assessment Patient has had a recent decline in their functional status and demonstrates the ability to make significant improvements in function in a reasonable and predictable amount of time.     Precautions / Restrictions Precautions Precautions: Fall Restrictions Weight Bearing Restrictions: No      Mobility  Bed Mobility Overal bed mobility: Modified Independent             General bed mobility comments: Increased time    Transfers Overall transfer level: Modified independent Equipment used: None               General transfer comment: Increased time    Ambulation/Gait Ambulation/Gait assistance: Min guard Gait Distance (Feet): 180 Feet Assistive device: Rolling walker (2 wheels), 1 person hand held assist Gait Pattern/deviations: Step-through pattern, Decreased step length - right, Decreased step length - left, Decreased dorsiflexion - right, Decreased dorsiflexion - left, Shuffle, Trunk flexed Gait velocity: decreased     General Gait Details: Pt ambulated with RW 61ft with min guard, no physical assist required or overt LOB noted, pt with some SOB and standing recovery period provided. Pt ambulated an additional 15ft with HHA with increased lateral sway and trunk flexion; educated pt and family to utilize AD during recovery, verbalized understanding.  Stairs            Wheelchair Mobility    Modified Rankin (Stroke Patients Only)       Balance                                             Pertinent Vitals/Pain Pain Assessment Pain  Assessment: No/denies pain    Home Living Family/patient expects to be discharged to:: Private residence Living Arrangements: Children Available Help at Discharge: Friend(s);Available 24 hours/day Type of Home: House Home Access: Level entry       Home Layout: One level Home Equipment:  Agricultural consultant (2 wheels);Rollator (4 wheels);Cane - single point Additional Comments: Daughter assisted with home environment information    Prior Function Prior Level of Function : Independent/Modified Independent             Mobility Comments: IND, though expressed she should be using something ADLs Comments: IND except recently daughter has started cooking breakfast for her.     Hand Dominance   Dominant Hand: Right    Extremity/Trunk Assessment   Upper Extremity Assessment Upper Extremity Assessment: Overall WFL for tasks assessed    Lower Extremity Assessment Lower Extremity Assessment: Overall WFL for tasks assessed    Cervical / Trunk Assessment Cervical / Trunk Assessment: Kyphotic  Communication   Communication: No difficulties  Cognition Arousal/Alertness: Awake/alert Behavior During Therapy: WFL for tasks assessed/performed Overall Cognitive Status: Within Functional Limits for tasks assessed                                          General Comments General comments (skin integrity, edema, etc.): Multiple family members present    Exercises     Assessment/Plan    PT Assessment Patient needs continued PT services  PT Problem List Decreased strength;Decreased range of motion;Decreased activity tolerance;Decreased balance;Decreased mobility;Decreased knowledge of use of DME;Decreased safety awareness       PT Treatment Interventions DME instruction;Gait training;Stair training;Functional mobility training;Therapeutic activities;Therapeutic exercise;Balance training;Neuromuscular re-education;Patient/family education    PT Goals (Current goals can be found in the Care Plan section)  Acute Rehab PT Goals Patient Stated Goal: To get stronger PT Goal Formulation: With patient Time For Goal Achievement: 10/20/22 Potential to Achieve Goals: Good    Frequency Min 1X/week     Co-evaluation               AM-PAC PT "6 Clicks"  Mobility  Outcome Measure Help needed turning from your back to your side while in a flat bed without using bedrails?: None Help needed moving from lying on your back to sitting on the side of a flat bed without using bedrails?: None Help needed moving to and from a bed to a chair (including a wheelchair)?: A Little Help needed standing up from a chair using your arms (e.g., wheelchair or bedside chair)?: A Little Help needed to walk in hospital room?: A Little Help needed climbing 3-5 steps with a railing? : A Little 6 Click Score: 20    End of Session Equipment Utilized During Treatment: Gait belt Activity Tolerance: Patient tolerated treatment well;No increased pain Patient left: in chair;with call bell/phone within reach;with chair alarm set;with family/visitor present Nurse Communication: Mobility status PT Visit Diagnosis: Difficulty in walking, not elsewhere classified (R26.2)    Time: 1610-9604 PT Time Calculation (min) (ACUTE ONLY): 24 min   Charges:   PT Evaluation $PT Eval Low Complexity: 1 Low PT Treatments $Gait Training: 8-22 mins        Jamesetta Geralds, PT, DPT [facility] Rehabilitation Department Office: [phone]  Jamesetta Geralds 10/06/2022, 5:22 PM

## 2022-10-06 NOTE — Progress Notes (Signed)
Initial Nutrition Assessment  DOCUMENTATION CODES:   Not applicable  INTERVENTION:  - Regular diet.  - Monitor intake for need to add nutrition supplements. Per daughter, patient doesn't like most supplements. - Monitor weight trends.   NUTRITION DIAGNOSIS:   Increased nutrient needs related to acute illness as evidenced by estimated needs.  GOAL:   Patient will meet greater than or equal to 90% of their needs  MONITOR:   PO intake, Supplement acceptance, Weight trends, I & O's  REASON FOR ASSESSMENT:   Consult Assessment of nutrition requirement/status  ASSESSMENT:   82 y.o. female with PMH GERD, HTN, HLD, asthma, stage 3 adenocarcinoma of the lung, hypothyroidism, dementia who presented due to swelling of her left arm and face. Admitted for acute respiratory failure.   Called bedside telephone and spoke with patient's daughter, Angelica [name].   She reports patient's UBW to be around 135#, notes it is higher than that now likely due to edema. Weight history as below: 11/14/21: 155#  02/14/22: 149# 04/11/22: 142# 07/09/22: 132# 09/24/22: 128# 10/01/22: 135# 10/05/22: 147#  Weight history indicates a 27# or 17.4% weight loss over the past ~1 year, which is borderline significant. Suspect last two weights, including current admission weight, are elevated due to edema.  Daughter confirms changes. Notes she was higher in weight a year ago due to finishing chemo/radiation and eating well with a lot of [employer] and [employer].   Patient typically consumes 2 meals a day at home. Usually breakfast, a lunch, and a snack around dinner time.  Does not drink any nutrition supplements as she has tried Ensure and it makes her stomach upset. Discussed trying Magic Cup or Molli Posey but daughter feels she will not tolerate or like as she doesn't like milky things. Noted strawberry extract allergy in chart and daughter says they tried Boost Breeze last admission but she didn't like it.  Patient has  "Ensure [nutritional supplements]" allergy listed in chart.  Thankfully, patient documented to have consumed 95% of breakfast this morning. Daughter plans to order meals for patient of foods she will like. Will monitor intake for need to add nutrition supplements.  Daughter Angelica [name] provided her phone number [phone]) for when RD follows up and she is not there as she reports patient likely won't be able to answer questions.    Medications reviewed and include: Protonix  Labs reviewed:  -   NUTRITION - FOCUSED PHYSICAL EXAM:  RD working remotely, unable to complete at this time  Diet Order:   Diet Order             Diet regular Room service appropriate? Yes; Fluid consistency: Thin  Diet effective now                   EDUCATION NEEDS:  No education needs have been identified at this time  Skin:  Skin Assessment: Reviewed RN Assessment  Last BM:  5/18  Height:  Ht Readings from Last 1 Encounters:  10/05/22 5\' 2"  (1.575 m)   Weight:  Wt Readings from Last 1 Encounters:  10/05/22 66.7 kg    BMI:  Body mass index is 26.89 kg/m.  Estimated Nutritional Needs:  Kcal:  1650-1750 Protein:  70-80 grams Fluid:  >/= 1.6L    Shelle Iron RD, LDN For contact information, refer to [facility].

## 2022-10-07 ENCOUNTER — Inpatient Hospital Stay (HOSPITAL_COMMUNITY): Payer: Medicare HMO

## 2022-10-07 DIAGNOSIS — J9601 Acute respiratory failure with hypoxia: Secondary | ICD-10-CM | POA: Diagnosis not present

## 2022-10-07 LAB — COMPREHENSIVE METABOLIC PANEL
ALT: 11 U/L (ref 0–44)
AST: 15 U/L (ref 15–41)
Albumin: 2.8 g/dL — ABNORMAL LOW (ref 3.5–5.0)
Alkaline Phosphatase: 61 U/L (ref 38–126)
Anion gap: 11 (ref 5–15)
BUN: 11 mg/dL (ref 8–23)
CO2: 23 mmol/L (ref 22–32)
Calcium: 8.3 mg/dL — ABNORMAL LOW (ref 8.9–10.3)
Chloride: 105 mmol/L (ref 98–111)
Creatinine, Ser: 0.89 mg/dL (ref 0.44–1.00)
GFR, Estimated: >60 mL/min (ref >60)
Glucose, Bld: 86 mg/dL (ref 70–99)
Potassium: 3.1 mmol/L — ABNORMAL LOW (ref 3.5–5.1)
Sodium: 139 mmol/L (ref 135–145)
Total Bilirubin: 0.5 mg/dL (ref 0.3–1.2)
Total Protein: 6.5 g/dL (ref 6.5–8.1)

## 2022-10-07 LAB — BODY FLUID CELL COUNT WITH DIFFERENTIAL
Eos, Fluid: 0 %
Lymphs, Fluid: 46 %
Monocyte-Macrophage-Serous Fluid: 23 % — ABNORMAL LOW (ref 50–90)
Neutrophil Count, Fluid: 31 % — ABNORMAL HIGH (ref 0–25)
Total Nucleated Cell Count, Fluid: 1132 cu mm — ABNORMAL HIGH (ref 0–1000)

## 2022-10-07 LAB — HEPATIC FUNCTION PANEL
ALT: 12 U/L (ref 0–44)
AST: 17 U/L (ref 15–41)
Albumin: 2.9 g/dL — ABNORMAL LOW (ref 3.5–5.0)
Alkaline Phosphatase: 66 U/L (ref 38–126)
Bilirubin, Direct: 0.1 mg/dL (ref 0.0–0.2)
Indirect Bilirubin: 0.4 mg/dL (ref 0.3–0.9)
Total Bilirubin: 0.5 mg/dL (ref 0.3–1.2)
Total Protein: 7.1 g/dL (ref 6.5–8.1)

## 2022-10-07 LAB — LACTATE DEHYDROGENASE: LDH: 164 U/L (ref 98–192)

## 2022-10-07 LAB — CBC
HCT: 32.2 % — ABNORMAL LOW (ref 36.0–46.0)
Hemoglobin: 10.5 g/dL — ABNORMAL LOW (ref 12.0–15.0)
MCH: 27.9 pg (ref 26.0–34.0)
MCHC: 32.6 g/dL (ref 30.0–36.0)
MCV: 85.4 fL (ref 80.0–100.0)
Platelets: 203 10*3/uL (ref 150–400)
RBC: 3.77 MIL/uL — ABNORMAL LOW (ref 3.87–5.11)
RDW: 17.1 % — ABNORMAL HIGH (ref 11.5–15.5)
WBC: 12.8 10*3/uL — ABNORMAL HIGH (ref 4.0–10.5)
nRBC: 0 % (ref 0.0–0.2)

## 2022-10-07 LAB — PROTEIN, PLEURAL OR PERITONEAL FLUID: Total protein, fluid: 3.2 g/dL

## 2022-10-07 LAB — CULTURE, BLOOD (ROUTINE X 2): Culture: NO GROWTH

## 2022-10-07 LAB — LACTATE DEHYDROGENASE, PLEURAL OR PERITONEAL FLUID: LD, Fluid: 71 U/L — ABNORMAL HIGH (ref 3–23)

## 2022-10-07 LAB — VITAMIN B12: Vitamin B-12: 155 pg/mL — ABNORMAL LOW (ref 180–914)

## 2022-10-07 MED ORDER — ALBUMIN HUMAN 25 % IV SOLN
12.5000 g | Freq: Once | INTRAVENOUS | Status: AC
  Administered 2022-10-07: 12.5 g via INTRAVENOUS
  Filled 2022-10-07: qty 50

## 2022-10-07 MED ORDER — FUROSEMIDE 10 MG/ML IJ SOLN
20.0000 mg | Freq: Once | INTRAMUSCULAR | Status: AC
  Administered 2022-10-07: 20 mg via INTRAVENOUS
  Filled 2022-10-07: qty 2

## 2022-10-07 MED ORDER — CYANOCOBALAMIN 1000 MCG/ML IJ SOLN
1000.0000 ug | Freq: Every day | INTRAMUSCULAR | Status: DC
  Administered 2022-10-07 – 2022-10-09 (×3): 1000 ug via INTRAMUSCULAR
  Filled 2022-10-07 (×3): qty 1

## 2022-10-07 MED ORDER — POTASSIUM CHLORIDE CRYS ER 20 MEQ PO TBCR
40.0000 meq | EXTENDED_RELEASE_TABLET | Freq: Once | ORAL | Status: AC
  Administered 2022-10-07: 40 meq via ORAL
  Filled 2022-10-07: qty 2

## 2022-10-07 MED ORDER — LIDOCAINE HCL 1 % IJ SOLN
INTRAMUSCULAR | Status: AC
  Filled 2022-10-07: qty 20

## 2022-10-07 NOTE — Progress Notes (Signed)
PROGRESS NOTE    Angelica [name]  RUE:[id number] DOB: [birth year] DOA: 10/05/2022 PCP: [name], MD   Brief Narrative: 82 year old with past medical history significant for GERD, hypertension, hyperlipidemia, IDA, asthma, stage III adenocarcinoma of the lung, hypothyroidism, dementia who was sent to the ED from her PCP office due to swelling of the left arm and face.  Symptoms started a week prior to admission, with swelling of the left eye.  Thought to be related to allergies.  She presented to the ED on 5/14 because she was not feeling well yet only abnormal finding was high TSH.  Patient has been off of her Synthroid and this was resumed.    Evaluation in the ED CTA chest negative for PE,  moderate to large bilateral pleural effusion with associated passive atelectasis.  Diffuse low-grade infiltrative edema in the mediastinum and also in the SVC tissue suggesting there is spacing of fluid.  CT soft tissue neck showed diffuse anasarca  which may be slightly more asymmetric in the left neck compared to the right. No definite underlying lesion is visualized. Small bilateral pleural effusions, new from prior exam  Assessment & Plan:   Principal Problem:   Acute respiratory failure with hypoxia (HCC) Active Problems:   Edema   Bilateral pleural effusion   Weakness   Hypothyroidism   Adenocarcinoma of left lung, stage 3 (HCC)   Asthma, severe persistent, well-controlled   HTN (hypertension)   Iron deficiency   History of stroke   Dementia (HCC)   HLD (hyperlipidemia)   GERD (gastroesophageal reflux disease)   Other allergic rhinitis  1-Acute hypoxic respiratory failure: Bilateral pleural effusion, worse left  -Patient presents with 2 weeks history of worsening fatigue, found to have edema face left arm, in the ED she was found to be hypoxic oxygen sat 88 on room air.  Patient with bilateral pleural effusion and diffuse third spacing of fluid. -IR consulted for thoracentesis.  -She  develops leukocytosis. Will cover for PNA, Continue with IV ceftriaxone and Azithromycin.  -[name].  -BNP normal.  -underwent right side  thoracentesis on 5/19 yielding 400 cc fluids. LDH, Protein ratio consistent with transudate. Will check ECHO.  -Will give a dose of IV lasix and albumin.  She will need thora probably of left side.    Edema, anasarca: -CTA chest: Suggestive edema in mediastinum  suggesting third spacing of fluid.  -CT soft tissue of neck with no seen obstruction, diffuse anasarca. No obstruction of venous system  -Will give IV lasix today  Weakness: TSH normal limits.  Needs PT>  B 12: low, started IM injection.   Hypothyroidism: TSH now normal, was elevated 4/18 Per daughter patient is on Synthroid 50 mcg, dose adjusted.   Adenocarcinoma of the left [place] stage III: Status post chemo and radiation.  Initial chemo in 2022 Continue follow-up outpatient Presents with pleural effusion, will send for Cytology   Asthma, severe persistent, well-controlled: On Dulera, Nebulizer.  On Benralizumab q8 weeks   Hypertension: Hold Norvasc, to see if we can do diuresis.   Iron deficiency anemia: Monitor hb.   History  stroke: On Plavix.   Dementia: Continue with Namenda.   Hyperlipidemia: On Pravachol   GERD:  PPI.   Other allergic rhinitis Continue flonase/zyrtec daily   Leucocytosis;  Will cover for PNA.  Thoracentesis today, follow culture Denies abdominal pain.  Blood culture: No growth to date.   Hypokalemia; replete orally.   Estimated body mass index is  26.89 kg/m as calculated from the following:   Height as of this encounter: 5\' 2"  (1.575 m).   Weight as of this encounter: 66.7 kg.   DVT prophylaxis: SCD Code Status: Full code Family Communication: Care discussed with daughter  Disposition Plan:  Status is: Observation The patient remains OBS appropriate and will d/c before 2 midnights.    Consultants:   IR  Procedures:    Antimicrobials:    Subjective: She is alert, she feels weak, tired.  Still having edema left arm.   Objective: Vitals:   10/06/22 2030 10/07/22 0442 10/07/22 0818 10/07/22 0820  BP: 132/71 105/66    Pulse: 80 76    Resp: 20 18    Temp: 98.1 F (36.7 C) 98.5 F (36.9 C)    TempSrc: Oral Oral    SpO2: 99% 100% 99% 99%  Weight:      Height:        Intake/Output Summary (Last 24 hours) at 10/07/2022 0914 Last data filed at 10/07/2022 0810 Gross per 24 hour  Intake 1180 ml  Output 700 ml  Net 480 ml    Filed Weights   10/05/22 0909  Weight: 66.7 kg    Examination:  General exam: NAD Respiratory system: BL crackles.  Cardiovascular system: S 1, S 2 RRR Gastrointestinal system: BS present, soft, nt Central nervous system: alert Extremities: edema left arm   Data Reviewed: I have personally reviewed following labs and imaging studies  CBC: Recent Labs  Lab 10/02/22 0930 10/05/22 1040 10/06/22 0338 10/06/22 1134 10/07/22 0304  WBC 6.5 8.0 31.8* 25.0* 12.8*  NEUTROABS 4.7 6.1  --  22.0*  --   HGB 11.0* 11.3* 11.0* 10.8* 10.5*  HCT 34.4* 34.9* 32.9* 32.9* 32.2*  MCV 84.9 85.7 84.6 84.6 85.4  PLT 243 248 234 236 203    Basic Metabolic Panel: Recent Labs  Lab 10/02/22 0930 10/05/22 1040 10/06/22 0338 10/07/22 0304  NA 134* 137 135 139  K 4.1 3.7 3.7 3.1*  CL 104 106 104 105  CO2 22 23 23 23   GLUCOSE 119* 94 99 86  BUN 11 8 10 11   CREATININE 0.81 0.66 0.86 0.89  CALCIUM 8.6* 8.6* 8.3* 8.3*    GFR: Estimated Creatinine Clearance: 43.6 mL/min (by C-G formula based on SCr of 0.89 mg/dL). Liver Function Tests: Recent Labs  Lab 10/02/22 0930 10/05/22 1040 10/07/22 0304  AST 27 15 15   ALT 10 12 11   ALKPHOS 65 65 61  BILITOT 1.1 0.7 0.5  PROT 7.5 7.0 6.5  ALBUMIN 3.0* 3.0* 2.8*    Recent Labs  Lab 10/02/22 0930 10/05/22 2104  LIPASE 35 35    No results for input(s): "AMMONIA" in the last 168 hours. Coagulation  Profile: No results for input(s): "INR", "PROTIME" in the last 168 hours. Cardiac Enzymes: No results for input(s): "CKTOTAL", "CKMB", "CKMBINDEX", "TROPONINI" in the last 168 hours. BNP (last 3 results) No results for input(s): "PROBNP" in the last 8760 hours. HbA1C: No results for input(s): "HGBA1C" in the last 72 hours. CBG: No results for input(s): "GLUCAP" in the last 168 hours. Lipid Profile: No results for input(s): "CHOL", "HDL", "LDLCALC", "TRIG", "CHOLHDL", "LDLDIRECT" in the last 72 hours. Thyroid Function Tests: Recent Labs    10/05/22 1040 10/05/22 2104  TSH 3.632  --   FREET4  --  1.14*    Anemia Panel: Recent Labs    10/07/22 0304  VITAMINB12 155*   Sepsis Labs: Recent Labs  Lab 10/05/22 1524 10/05/22  1759  LATICACIDVEN 2.7* 1.4     Recent Results (from the past 240 hour(s))  Culture, blood (Routine X 2) w Reflex to ID Panel     Status: None (Preliminary result)   Collection Time: 10/06/22  4:35 PM   Specimen: BLOOD RIGHT ARM  Result Value Ref Range Status   Specimen Description   Final    BLOOD RIGHT ARM Performed at [facility], [address], [place], [place] [address]    Special Requests   Final    AEROBIC BOTTLE ONLY Blood Culture adequate volume Performed at [facility], [address], [place], [place] [address]    Culture   Final    NO GROWTH < 12 HOURS Performed at [facility], [address], [place], [place] [address]    Report Status PENDING  Incomplete  Culture, blood (Routine X 2) w Reflex to ID Panel     Status: None (Preliminary result)   Collection Time: 10/06/22  4:44 PM   Specimen: BLOOD LEFT ARM  Result Value Ref Range Status   Specimen Description   Final    BLOOD LEFT ARM Performed at [facility], [address], [place], [place] [address]    Special Requests   Final    AEROBIC BOTTLE ONLY Blood Culture adequate volume Performed at [facility], [address], [place], [place] [address]    Culture   Final    NO GROWTH < 12 HOURS Performed at [facility], [address] [place], [place] [address]    Report Status PENDING  Incomplete         Radiology Studies: DG Chest 2 View  Result Date: 10/06/2022 CLINICAL DATA:  Pleural effusion. Acute respiratory failure with hypoxia. Stage III adenocarcinoma of the lung. EXAM: CHEST - 2 VIEW COMPARISON:  10/02/2022 FINDINGS: RIGHT-sided PowerPort tip overlies the superior vena cava. Heart size is normal. There is increased opacity in the LEFT lung base, consisting of pleural effusion and atelectasis or consolidation. The RIGHT lung remains clear. No pulmonary edema. IMPRESSION: Increased opacity in the LEFT lung base, consisting of pleural effusion and atelectasis or consolidation. Electronically Signed   By: Norva Pavlov M.D.   On: 10/06/2022 08:40   DG Abd 1 View  Result Date: 10/06/2022 CLINICAL DATA:  82 year old female with generalized stomach pain. EXAM: ABDOMEN - 1 VIEW COMPARISON:  CTA chest 10/05/2022. CT Abdomen and Pelvis 10/19/2021. FINDINGS: AP view at 0531 hours. Bilateral pleural effusions better demonstrated by CTA yesterday. Excreted IV contrast in the urinary bladder. Non obstructed bowel gas pattern. Degenerative changes in the spine. No acute osseous abnormality identified. IMPRESSION: Non obstructed bowel gas pattern. Electronically Signed   By: Odessa Fleming M.D.   On: 10/06/2022 06:00   UE Venous Duplex ([facility] and [facility] ONLY)  Result Date: 10/05/2022 UPPER VENOUS STUDY  Patient Name:  ALVIRA MUHA  Date of Exam:   10/05/2022 Medical Rec #: [id number]         Accession #:    [id number] Date of Birth: March 23, 1941          Patient Gender: F Patient Age:   55 years Exam Location:  [facility] Procedure:      VAS [place] UPPER EXTREMITY VENOUS DUPLEX Referring Phys: Lynden Oxford  --------------------------------------------------------------------------------  Indications: Left arm, face, and hand swelling Risk Factors: HX of Lung CA. Comparison Study: No previosu exams Performing Technologist: Jody Hill RVT, RDMS  Examination Guidelines: A complete evaluation includes B-mode imaging, spectral Doppler, color Doppler, and power Doppler as needed of all accessible portions of each vessel. Bilateral testing is considered an integral part of a complete examination. Limited examinations for reoccurring indications may be performed as noted.  Right Findings: +----------+------------+---------+-----------+----------+-------+ RIGHT     CompressiblePhasicitySpontaneousPropertiesSummary +----------+------------+---------+-----------+----------+-------+ Subclavian               Yes       Yes                      +----------+------------+---------+-----------+----------+-------+ Limited visualization of subclavian due to port placement patent by color and doppler  Left Findings: +----------+------------+---------+-----------+----------+-------+ LEFT      CompressiblePhasicitySpontaneousPropertiesSummary +----------+------------+---------+-----------+----------+-------+ IJV           Full       Yes       Yes                      +----------+------------+---------+-----------+----------+-------+ Subclavian    Full       Yes       Yes                      +----------+------------+---------+-----------+----------+-------+ Axillary      Full       Yes       Yes                      +----------+------------+---------+-----------+----------+-------+ Brachial      Full       Yes       Yes                      +----------+------------+---------+-----------+----------+-------+ Radial        Full                                          +----------+------------+---------+-----------+----------+-------+ Ulnar         Full                                           +----------+------------+---------+-----------+----------+-------+ Cephalic      Full                                          +----------+------------+---------+-----------+----------+-------+ Basilic       Full       Yes       Yes                      +----------+------------+---------+-----------+----------+-------+  Summary:  Right: No evidence of thrombosis in the subclavian.  Left: No evidence of deep vein thrombosis in the upper extremity. No evidence of superficial vein thrombosis in the upper extremity.  *See table(s) above for measurements and observations.  Diagnosing physician: Gerarda Fraction Electronically signed by Gerarda Fraction on 10/05/2022 at 2:56:59 PM.    Final    CT Soft Tissue Neck W Contrast  Result Date: 10/05/2022 CLINICAL DATA:  Neck mass, nonpulsatile concern for left neck and face swelling EXAM: CT NECK WITH CONTRAST TECHNIQUE: Multidetector CT imaging of the neck was performed using the  standard protocol following the bolus administration of intravenous contrast. RADIATION DOSE REDUCTION: This exam was performed according to the departmental dose-optimization program which includes automated exposure control, adjustment of the mA and/or kV according to patient size and/or use of iterative reconstruction technique. CONTRAST:  OMNIPAQUE IOHEXOL 350 MG/ML SOLN COMPARISON:  None Available. FINDINGS: Limitations: Assessment is slightly limited due to motion artifact of the level of the oral and hypopharynx. Pharynx and larynx: The epiglottis is normal in appearance. Bilateral tonsils are normal in appearance. No evidence of parapharyngeal space soft tissue stranding. Salivary glands: No inflammation, mass, or stone. Thyroid: Heterogeneous and multinodular left thyroid lobe, not significantly changed compared to prior CT chest dated 04/11/2019. Lymph nodes: Within the limitations of motion artifact, no definite cervical lymphadenopathy is visualized. Vascular:  Negative. Limited intracranial: Incompletely imaged Visualized orbits: Not imaged Mastoids and visualized paranasal sinuses: No middle ear or mastoid effusion. There is mucosal thickening in the bilateral maxillary sinuses. Skeleton: Straightening of the normal cervical lordosis. No suspicious osseous lesions. Upper chest: Small bilateral pleural effusions, new from prior exam. Right chest port in place with tip visualized to the level of the mid to lower SVC. See separately dictated chest for findings below the thoracic inlet. Other: There is diffuse anasarca there may be slightly more asymmetric in the left neck compared to the right. No definite underlying lesion is visualized. IMPRESSION: 1. Diffuse anasarca, which may be slightly more asymmetric in the left neck compared to the right. No definite underlying lesion is visualized. 2. Small bilateral pleural effusions, new from prior exam. See separately dictated chest for findings below the thoracic inlet. Electronically Signed   By: Lorenza Cambridge M.D.   On: 10/05/2022 14:27   CT Angio Chest PE W and/or Wo Contrast  Result Date: 10/05/2022 CLINICAL DATA:  Left lower neck swelling. Cancer history. Query pulmonary embolus or SVC syndrome. EXAM: CT ANGIOGRAPHY CHEST WITH CONTRAST TECHNIQUE: Multidetector CT imaging of the chest was performed using the standard protocol during bolus administration of intravenous contrast. Multiplanar CT image reconstructions and MIPs were obtained to evaluate the vascular anatomy. RADIATION DOSE REDUCTION: This exam was performed according to the departmental dose-optimization program which includes automated exposure control, adjustment of the mA and/or kV according to patient size and/or use of iterative reconstruction technique. CONTRAST:  OMNIPAQUE IOHEXOL 350 MG/ML SOLN COMPARISON:  07/10/2022 FINDINGS: Cardiovascular: No filling defect is identified in the pulmonary arterial tree to suggest pulmonary embolus.  Coronary, aortic arch, and branch vessel atherosclerotic vascular disease. No thoracic aortic aneurysm or dissection is identified. Right Port-A-Cath tip: SVC. Because today's exam is timed for pulmonary arterial opacification, the systemic veins are not opacified and accordingly assessment of patency is limited. Mediastinum/Nodes: No obvious mass lesion impinging on the SVC, brachiocephalic vein, or left axillary vein. There is diffuse low-grade infiltrative edema in the mediastinum and also in the subcutaneous tissues bilaterally. Lungs/Pleura: Moderate to large bilateral pleural effusions with associated passive atelectasis. Upper Abdomen: Unremarkable Musculoskeletal: Thoracic spondylosis. Review of the MIP images confirms the above findings. IMPRESSION: 1. No filling defect is identified in the pulmonary arterial tree to suggest pulmonary embolus. 2. Moderate to large bilateral pleural effusions with associated passive atelectasis. 3. Diffuse low-grade infiltrative edema in the mediastinum and also in the subcutaneous tissues bilaterally, suggesting third spacing of fluid. 4. No obvious mass lesion impinging on the SVC, brachiocephalic vein, or left axillary vein. Please note that systemic venous structures are not opacified on today's exam, which was  timed for pulmonary arterial opacification. Electronically Signed   By: Gaylyn Rong M.D.   On: 10/05/2022 14:08        Scheduled Meds:  Chlorhexidine Gluconate Cloth  6 each Topical Daily   clopidogrel  75 mg Oral Daily   cyanocobalamin  1,000 mcg Intramuscular Daily   fluticasone  1 spray Each Nare BID   levothyroxine  50 mcg Oral QAC breakfast   loratadine  10 mg Oral Daily   memantine  10 mg Oral BID   mometasone-formoterol  2 puff Inhalation BID   pantoprazole  40 mg Oral Daily   potassium chloride  40 mEq Oral Once   pravastatin  80 mg Oral Daily   sodium chloride flush  3 mL Intravenous Q12H   Continuous Infusions:  sodium  chloride     azithromycin Stopped (10/06/22 1409)   cefTRIAXone (ROCEPHIN)  IV Stopped (10/06/22 1216)     LOS: 1 day    Time spent: 35 minutes.     Alba Cory, MD [facility]   If 7PM-7AM, please contact night-coverage [email or web address]  10/07/2022, 9:14 AM

## 2022-10-07 NOTE — Procedures (Signed)
Ultrasound-guided diagnostic and therapeutic right thoracentesis performed yielding 450 cc of yellow fluid. No immediate complications. Follow-up chest x-ray pending. The fluid was sent to the lab for preordered studies. EBL none. By [place] today, more pleural fluid noted on right than left preprocedure.

## 2022-10-08 ENCOUNTER — Inpatient Hospital Stay (HOSPITAL_COMMUNITY): Payer: Medicare HMO

## 2022-10-08 DIAGNOSIS — J9601 Acute respiratory failure with hypoxia: Secondary | ICD-10-CM | POA: Diagnosis not present

## 2022-10-08 DIAGNOSIS — R9431 Abnormal electrocardiogram [ECG] [EKG]: Secondary | ICD-10-CM | POA: Diagnosis not present

## 2022-10-08 LAB — BASIC METABOLIC PANEL
Anion gap: 7 (ref 5–15)
BUN: 11 mg/dL (ref 8–23)
CO2: 22 mmol/L (ref 22–32)
Calcium: 8.2 mg/dL — ABNORMAL LOW (ref 8.9–10.3)
Chloride: 107 mmol/L (ref 98–111)
Creatinine, Ser: 0.76 mg/dL (ref 0.44–1.00)
GFR, Estimated: >60 mL/min (ref >60)
Glucose, Bld: 119 mg/dL — ABNORMAL HIGH (ref 70–99)
Potassium: 3.5 mmol/L (ref 3.5–5.1)
Sodium: 136 mmol/L (ref 135–145)

## 2022-10-08 LAB — ECHOCARDIOGRAM COMPLETE
AR max vel: 1.7 cm2
AV Area VTI: 1.92 cm2
AV Area mean vel: 1.84 cm2
AV Mean grad: 3 mmHg
AV Peak grad: 7.4 mmHg
Ao pk vel: 1.36 m/s
Area-P 1/2: 4.06 cm2
Height: 62 in
S' Lateral: 1.4 cm
Single Plane A4C EF: 46.6 %
Weight: 2352 oz

## 2022-10-08 LAB — CULTURE, BLOOD (ROUTINE X 2)

## 2022-10-08 LAB — CBC
HCT: 29 % — ABNORMAL LOW (ref 36.0–46.0)
Hemoglobin: 9.6 g/dL — ABNORMAL LOW (ref 12.0–15.0)
MCH: 28.7 pg (ref 26.0–34.0)
MCHC: 33.1 g/dL (ref 30.0–36.0)
MCV: 86.8 fL (ref 80.0–100.0)
Platelets: 199 10*3/uL (ref 150–400)
RBC: 3.34 MIL/uL — ABNORMAL LOW (ref 3.87–5.11)
RDW: 17.2 % — ABNORMAL HIGH (ref 11.5–15.5)
WBC: 7.5 10*3/uL (ref 4.0–10.5)
nRBC: 0 % (ref 0.0–0.2)

## 2022-10-08 MED ORDER — ORAL CARE MOUTH RINSE: 15.0000 mL | OROMUCOSAL | Status: DC | PRN

## 2022-10-08 MED ORDER — AZITHROMYCIN 250 MG PO TABS
500.0000 mg | ORAL_TABLET | Freq: Every day | ORAL | Status: DC
  Administered 2022-10-08 – 2022-10-09 (×2): 500 mg via ORAL
  Filled 2022-10-08 (×2): qty 2

## 2022-10-08 MED ORDER — POTASSIUM CHLORIDE CRYS ER 20 MEQ PO TBCR
40.0000 meq | EXTENDED_RELEASE_TABLET | Freq: Once | ORAL | Status: AC
  Administered 2022-10-08: 40 meq via ORAL
  Filled 2022-10-08: qty 2

## 2022-10-08 MED ORDER — ALBUMIN HUMAN 25 % IV SOLN
12.5000 g | Freq: Once | INTRAVENOUS | Status: AC
  Administered 2022-10-08: 12.5 g via INTRAVENOUS
  Filled 2022-10-08: qty 50

## 2022-10-08 MED ORDER — FUROSEMIDE 10 MG/ML IJ SOLN
20.0000 mg | Freq: Two times a day (BID) | INTRAMUSCULAR | Status: AC
  Administered 2022-10-08 (×2): 20 mg via INTRAVENOUS
  Filled 2022-10-08 (×2): qty 2

## 2022-10-08 MED ORDER — ALBUMIN HUMAN 25 % IV SOLN: 12.5000 g | Freq: Once | INTRAVENOUS | Status: DC

## 2022-10-08 NOTE — TOC Initial Note (Addendum)
Transition of Care [facility]) - Initial/Assessment Note    Patient Details  Name: Angelica [name] MRN: [id number] Date of Birth: 1940/09/07  Transition of Care [facility]) CM/SW Contact:    Howell Rucks, RN Phone Number: 10/08/2022, 2:25 PM Clinical Narrative:    Met with pt and dtr at bedside to introduce TOC/NCM role and revew dc plans, PT recommendation for [facility] PT, pt and dtr agreeable, no preference, request  NCM assistance, will put out [facility] requests. Will continue to follow     -3:44pm HH PT, [facility] rep-Kelly            Expected Discharge Plan: Home w Home Health Services Barriers to Discharge: Continued Medical Work up   Patient Goals and CMS Choice Patient states their goals for this hospitalization and ongoing recovery are:: Home with [facility] PT   Choice offered to / list presented to : Patient      Expected Discharge Plan and Services   Discharge Planning Services: CM Consult                                          Prior Living Arrangements/Services   Lives with:: Adult Children Patient language and need for interpreter reviewed:: Yes Do you feel safe going back to the place where you live?: Yes      Need for Family Participation in Patient Care: Yes (Comment) Care giver support system in place?: Yes (comment)   Criminal Activity/Legal Involvement Pertinent to Current Situation/Hospitalization: No - Comment as needed  Activities of Daily Living Home Assistive Devices/Equipment: Other (Comment) (UTA) ADL Screening (condition at time of admission) Patient's cognitive ability adequate to safely complete daily activities?: No Is the patient deaf or have difficulty hearing?: No Does the patient have difficulty seeing, even when wearing glasses/contacts?: No Does the patient have difficulty concentrating, remembering, or making decisions?: Yes Patient able to express need for assistance with ADLs?: No Does the patient have difficulty dressing or bathing?:  No Independently performs ADLs?: No Communication: Independent Dressing (OT): Needs assistance Is this a change from baseline?: Pre-admission baseline Grooming: Needs assistance Is this a change from baseline?: Pre-admission baseline Feeding: Independent Bathing: Needs assistance Is this a change from baseline?: Pre-admission baseline Toileting: Needs assistance Is this a change from baseline?: Pre-admission baseline In/Out Bed: Needs assistance Is this a change from baseline?: Pre-admission baseline Walks in Home: Needs assistance Is this a change from baseline?: Pre-admission baseline Does the patient have difficulty walking or climbing stairs?: Yes Weakness of Legs: Both Weakness of Arms/Hands: None  Permission Sought/Granted Permission sought to share information with : Case Manager Permission granted to share information with : Yes, Verbal Permission Granted  Share Information with NAME: Fannie Knee, RN           Emotional Assessment Appearance:: Appears stated age Attitude/Demeanor/Rapport: Gracious Affect (typically observed): Accepting Orientation: : Oriented to Self, Oriented to Place, Oriented to  Time, Oriented to Situation Alcohol / Substance Use: Not Applicable Psych Involvement: No (comment)  Admission diagnosis:  Shortness of breath [R06.02] Anasarca [R60.1] Peripheral edema [R60.0] Pleural effusion [J90] Hypoxia [R09.02] Acute respiratory failure with hypoxia (HCC) [J96.01] Patient Active Problem List   Diagnosis Date Noted   Acute respiratory failure with hypoxia (HCC) 10/05/2022   Bilateral pleural effusion 10/05/2022   History of stroke 10/05/2022   Weakness 10/05/2022   Edema 10/05/2022   Alzheimer's disease (HCC) 09/06/2022  Ex-smoker 09/06/2022   Hypothyroidism 09/06/2022   Right leg pain 07/13/2021   Dementia (HCC) 07/13/2021   RLQ abdominal pain 07/13/2021   Encounter for well adult exam with abnormal findings 06/10/2021   Anosmia  05/01/2021   Chronic ethmoidal sinusitis 02/14/2021   Debility 02/01/2021   Gait disorder 02/01/2021   Malignant neoplasm of bronchus and lung (HCC)    Port-A-Cath in place 11/02/2020   Adenocarcinoma of left lung, stage 3 (HCC) 06/22/2020   Lung mass    Pulmonary nodule 05/17/2020   Iron deficiency 05/03/2020   Swelling of lower extremity 07/30/2019   Ganglion cyst of right foot 01/16/2019   HLD (hyperlipidemia) 01/16/2019   LPRD (laryngopharyngeal reflux disease) 07/25/2018   Constipation 08/19/2017   Asthma, severe persistent, well-controlled 06/04/2017   Chronic pansinusitis 06/04/2017   Nasal polyposis 06/04/2017   Osteopenia 05/26/2017   GERD (gastroesophageal reflux disease)    H. pylori infection    Other allergic rhinitis 03/20/2016   Malnutrition of moderate degree 04/11/2015   HTN (hypertension) 04/10/2015   PCP:  [name], MD Pharmacy:   [facility] [address] [place] - [address] [place] [address] Phone: [phone] Fax: [phone]  [facility] - [place], [place] - [address] [place] [address] Phone: [phone] Fax: [phone]  [facility] [place] (New Address) - [place], [place] - [address] [facility] AT Previously: [place], [place] [address] [facility] [address] 4th Floor [address] [place] [address] Phone: [phone] Fax: [phone]  [facility] - [place], [place] - [address] [place] [address] Phone: [phone] Fax: [phone]  [id number] [place], [place] - [address] AT [facility] [address] [place] [address] Phone: [phone] Fax: [phone]     Social Determinants of Health (SDOH) Social History: SDOH Screenings   Food Insecurity: Patient Unable To Answer (10/05/2022)  Housing: Low Risk   (10/05/2022)  Transportation Needs: No Transportation Needs (10/05/2022)  Utilities: Patient Unable To Answer (10/05/2022)  Alcohol Screen: Low Risk  (02/27/2021)  Depression (PHQ2-9): Medium Risk (10/05/2022)  Financial Resource Strain: Low Risk  (10/02/2022)  Physical Activity: Unknown (10/02/2022)  Social Connections: Moderately Isolated (10/02/2022)  Stress: No Stress Concern Present (10/02/2022)  Tobacco Use: Medium Risk (10/05/2022)   SDOH Interventions:     Readmission Risk Interventions    10/08/2022    2:08 PM  Readmission Risk Prevention Plan  Transportation Screening Complete  PCP or Specialist Appt within 5-7 Days Complete  Home Care Screening Complete  Medication Review (RN CM) Complete

## 2022-10-08 NOTE — Progress Notes (Signed)
PROGRESS NOTE    Angelica [name]  ZOX:[id number] DOB: 21-Sep-1940 DOA: 10/05/2022 PCP: [name], MD   Brief Narrative: 82 year old with past medical history significant for GERD, hypertension, hyperlipidemia, IDA, asthma, stage III adenocarcinoma of the lung, hypothyroidism, dementia who was sent to the ED from her PCP office due to swelling of the left arm and face.  Symptoms started a week prior to admission, with swelling of the left eye.  Thought to be related to allergies.  She presented to the ED on 5/14 because she was not feeling well yet only abnormal finding was high TSH.  Patient has been off of her Synthroid and this was resumed.    Evaluation in the ED CTA chest negative for PE,  moderate to large bilateral pleural effusion with associated passive atelectasis.  Diffuse low-grade infiltrative edema in the mediastinum and also in the SVC tissue suggesting there is spacing of fluid.  CT soft tissue neck showed diffuse anasarca  which may be slightly more asymmetric in the left neck compared to the right. No definite underlying lesion is visualized.  Assessment & Plan:   Principal Problem:   Acute respiratory failure with hypoxia (HCC) Active Problems:   Edema   Bilateral pleural effusion   Weakness   Hypothyroidism   Adenocarcinoma of left lung, stage 3 (HCC)   Asthma, severe persistent, well-controlled   HTN (hypertension)   Iron deficiency   History of stroke   Dementia (HCC)   HLD (hyperlipidemia)   GERD (gastroesophageal reflux disease)   Other allergic rhinitis  1-Acute hypoxic respiratory failure: Bilateral pleural effusion, worse left , Transudate pleural fluid.  -Patient presents with 2 weeks history of worsening fatigue, found to have edema face left arm, in the ED she was found to be hypoxic oxygen sat 88 on room air.  Patient with bilateral pleural effusion and diffuse third spacing of fluid. -IR consulted for thoracentesis.  -She develops leukocytosis.  Started on  cover for PNA. - Continue with IV ceftriaxone and Azithromycin.  -[name].  -BNP normal.  -Underwent right side  thoracentesis on 5/19 yielding 400 cc fluids. LDH, Protein ratio consistent with transudate. Check ECHO -Will repeat  dose of IV lasix and albumin today  Plan to repeat chest x ray tomorrow to determine if she will need thoracentesis left   Edema, anasarca: -CTA chest: Suggestive edema in mediastinum  suggesting third spacing of fluid.  -CT soft tissue of neck with no seen obstruction, diffuse anasarca. No obstruction of venous system  Repeat IV lasix.   Weakness: TSH normal limits.  Needs PT>  B 12: low, started IM injection.   Hypothyroidism: TSH now normal, was elevated 4/18 Per daughter patient is on Synthroid 50 mcg, dose adjusted.   Adenocarcinoma of the left [place] stage III: Status post chemo and radiation.  Initial chemo in 2022 Continue follow-up outpatient Presents with pleural effusion, will send for Cytology   Asthma, severe persistent, well-controlled: On Dulera, Nebulizer.  On Benralizumab q8 weeks   Hypertension: Hold Norvasc, to see if we can do diuresis.   Iron deficiency anemia: Hb mildly decreased to 9.6. Check iron and ferritin tomorrow. Could consider IV iron if low.  Replete B12.  Check folic acid.   History  stroke: On Plavix.   Dementia: Continue with Namenda.   Hyperlipidemia: On Pravachol   GERD:  PPI.   Other allergic rhinitis Continue flonase/zyrtec daily   Leucocytosis; resolved Will cover for PNA.  Thoracentesis today,  follow culture Denies abdominal pain.  Blood culture: No growth to date.   Hypokalemia; Replaced.   Estimated body mass index is 26.89 kg/m as calculated from the following:   Height as of this encounter: 5\' 2"  (1.575 m).   Weight as of this encounter: 66.7 kg.   DVT prophylaxis: SCD Code Status: Full code Family Communication: Care discussed with daughter over  phone Disposition Plan:  Status is: Observation The patient remains OBS appropriate and will d/c before 2 midnights.    Consultants:  IR  Procedures:    Antimicrobials:    Subjective: She is breathing better, since she had fluid remove. Swelling left arm appears less.   Objective: Vitals:   10/08/22 0408 10/08/22 0800 10/08/22 0845 10/08/22 1301  BP: 131/83  116/66 121/69  Pulse: 81  81 90  Resp: 18  16 18   Temp: 97.9 F (36.6 C)   98 F (36.7 C)  TempSrc: Oral   Oral  SpO2: 99% 100% 100% 100%  Weight:      Height:        Intake/Output Summary (Last 24 hours) at 10/08/2022 1403 Last data filed at 10/08/2022 1302 Gross per 24 hour  Intake 1130 ml  Output 900 ml  Net 230 ml    Filed Weights   10/05/22 0909  Weight: 66.7 kg    Examination:  General exam: NAD Respiratory system: BL crackles.  Cardiovascular system: S 1, S 2 RRR Gastrointestinal system: BS present,soft, nt Central nervous system: Alert, follows command Extremities: edema left arm   Data Reviewed: I have personally reviewed following labs and imaging studies  CBC: Recent Labs  Lab 10/02/22 0930 10/05/22 1040 10/06/22 0338 10/06/22 1134 10/07/22 0304 10/08/22 0935  WBC 6.5 8.0 31.8* 25.0* 12.8* 7.5  NEUTROABS 4.7 6.1  --  22.0*  --   --   HGB 11.0* 11.3* 11.0* 10.8* 10.5* 9.6*  HCT 34.4* 34.9* 32.9* 32.9* 32.2* 29.0*  MCV 84.9 85.7 84.6 84.6 85.4 86.8  PLT 243 248 234 236 203 199    Basic Metabolic Panel: Recent Labs  Lab 10/02/22 0930 10/05/22 1040 10/06/22 0338 10/07/22 0304 10/08/22 0935  NA 134* 137 135 139 136  K 4.1 3.7 3.7 3.1* 3.5  CL 104 106 104 105 107  CO2 22 23 23 23 22   GLUCOSE 119* 94 99 86 119*  BUN 11 8 10 11 11   CREATININE 0.81 0.66 0.86 0.89 0.76  CALCIUM 8.6* 8.6* 8.3* 8.3* 8.2*    GFR: Estimated Creatinine Clearance: 48.5 mL/min (by C-G formula based on SCr of 0.76 mg/dL). Liver Function Tests: Recent Labs  Lab 10/02/22 0930 10/05/22 1040  10/07/22 0304 10/07/22 1037  AST 27 15 15 17   ALT 10 12 11 12   ALKPHOS 65 65 61 66  BILITOT 1.1 0.7 0.5 0.5  PROT 7.5 7.0 6.5 7.1  ALBUMIN 3.0* 3.0* 2.8* 2.9*    Recent Labs  Lab 10/02/22 0930 10/05/22 2104  LIPASE 35 35    No results for input(s): "AMMONIA" in the last 168 hours. Coagulation Profile: No results for input(s): "INR", "PROTIME" in the last 168 hours. Cardiac Enzymes: No results for input(s): "CKTOTAL", "CKMB", "CKMBINDEX", "TROPONINI" in the last 168 hours. BNP (last 3 results) No results for input(s): "PROBNP" in the last 8760 hours. HbA1C: No results for input(s): "HGBA1C" in the last 72 hours. CBG: No results for input(s): "GLUCAP" in the last 168 hours. Lipid Profile: No results for input(s): "CHOL", "HDL", "LDLCALC", "TRIG", "CHOLHDL", "LDLDIRECT" in  the last 72 hours. Thyroid Function Tests: Recent Labs    10/05/22 2104  FREET4 1.14*    Anemia Panel: Recent Labs    10/07/22 0304  VITAMINB12 155*    Sepsis Labs: Recent Labs  Lab 10/05/22 1524 10/05/22 1759  LATICACIDVEN 2.7* 1.4     Recent Results (from the past 240 hour(s))  Culture, blood (Routine X 2) w Reflex to ID Panel     Status: None (Preliminary result)   Collection Time: 10/06/22  4:35 PM   Specimen: BLOOD RIGHT ARM  Result Value Ref Range Status   Specimen Description   Final    BLOOD RIGHT ARM Performed at [facility], [address] [place], [place] [address]    Special Requests   Final    AEROBIC BOTTLE ONLY Blood Culture adequate volume Performed at [facility], [address], [place], [place] [address]    Culture   Final    NO GROWTH 2 DAYS Performed at [facility], [address], [place], [place] [address]    Report Status PENDING  Incomplete  Culture, blood (Routine X 2) w Reflex to ID Panel     Status: None (Preliminary result)   Collection Time: 10/06/22  4:44 PM   Specimen: BLOOD LEFT ARM  Result Value  Ref Range Status   Specimen Description   Final    BLOOD LEFT ARM Performed at [facility], [address] [place], [place] [address]    Special Requests   Final    AEROBIC BOTTLE ONLY Blood Culture adequate volume Performed at [facility], [address], [place], [place] [address]    Culture   Final    NO GROWTH 2 DAYS Performed at [facility], [address], [place], [place] [address]    Report Status PENDING  Incomplete         Radiology Studies: US THORACENTESIS ASP PLEURAL SPACE W/IMG GUIDE  Result Date: 10/07/2022 INDICATION: Patient with history of lung adenocarcinoma, dyspnea, bilateral pleural effusions, right greater than left ; request received for diagnostic and therapeutic thoracentesis EXAM: ULTRASOUND GUIDED DIAGNOSTIC AND THERAPEUTIC RIGHT THORACENTESIS MEDICATIONS: 8 mL 1% lidocaine COMPLICATIONS: None immediate. PROCEDURE: An ultrasound guided thoracentesis was thoroughly discussed with the patient/daughter and questions answered. The benefits, risks, alternatives and complications were also discussed. The patient/daughter understands and wishes to proceed with the procedure. Written consent was obtained. Ultrasound was performed to localize and mark an adequate pocket of fluid in the right chest. The area was then prepped and draped in the normal sterile fashion. 1% Lidocaine was used for local anesthesia. Under ultrasound guidance a 6 Fr Safe-T-Centesis catheter was introduced. Thoracentesis was performed. The catheter was removed and a dressing applied. FINDINGS: A total of approximately 450 cc of yellow fluid was removed. Samples were sent to the laboratory as requested by the clinical team. IMPRESSION: Successful ultrasound guided diagnostic and therapeutic right thoracentesis yielding 450 cc of pleural fluid. Performed by: Artemio Aly Electronically Signed   By: Marliss Coots M.D.   On: 10/07/2022 14:16   DG  Chest 1 View  Result Date: 10/07/2022 CLINICAL DATA:  82 year old female status post ultrasound-guided right side thoracentesis this morning. EXAM: CHEST  1 VIEW COMPARISON:  Ultrasound images 0817 hours today. Chest radiographs yesterday. FINDINGS: Portable AP upright view at 0958 hours. No pneumothorax. Allowing for portable technique the right lung now is largely negative. Stable left veiling pleural effusion at the  lung base. Stable cardiac size and mediastinal contours. Stable right chest power port. Sequelae of thyroidectomy. Negative visible bowel gas. No acute osseous abnormality identified. IMPRESSION: 1. No pneumothorax following right thoracentesis. 2. Stable left pleural effusion. Electronically Signed   By: Odessa Fleming M.D.   On: 10/07/2022 10:08        Scheduled Meds:  azithromycin  500 mg Oral Daily   Chlorhexidine Gluconate Cloth  6 each Topical Daily   clopidogrel  75 mg Oral Daily   cyanocobalamin  1,000 mcg Intramuscular Daily   fluticasone  1 spray Each Nare BID   furosemide  20 mg Intravenous Q12H   levothyroxine  50 mcg Oral QAC breakfast   loratadine  10 mg Oral Daily   memantine  10 mg Oral BID   mometasone-formoterol  2 puff Inhalation BID   pantoprazole  40 mg Oral Daily   pravastatin  80 mg Oral Daily   sodium chloride flush  3 mL Intravenous Q12H   Continuous Infusions:  sodium chloride     albumin human     cefTRIAXone (ROCEPHIN)  IV 2 g (10/08/22 1217)     LOS: 2 days    Time spent: 35 minutes.     Alba Cory, MD [facility]   If 7PM-7AM, please contact night-coverage [email or web address]  10/08/2022, 2:03 PM

## 2022-10-08 NOTE — Progress Notes (Signed)
Mobility Specialist - Progress Note   10/08/22 1350  Mobility  Activity Ambulated with assistance in hallway;Ambulated with assistance to bathroom  Level of Assistance Standby assist, set-up cues, supervision of patient - no hands on  Assistive Device Front wheel walker  Distance Ambulated (ft) 230 ft  Activity Response Tolerated well  Mobility Referral Yes  $Mobility charge 1 Mobility  Mobility Specialist Start Time (ACUTE ONLY) 0128  Mobility Specialist Stop Time (ACUTE ONLY) 0147  Mobility Specialist Time Calculation (min) (ACUTE ONLY) 19 min   Pt received in recliner and agreeable to mobility. Prior to ambulating pt requested assistance to bathroom. No complaints during session. Pt to recliner after session with all needs met.   During mobility: 97% SpO2   Chief Technology Officer

## 2022-10-08 NOTE — Progress Notes (Signed)
Physical Therapy Treatment Patient Details Name: Angelica [name] MRN: [id number] DOB: 25-Mar-1941 Today's Date: 10/08/2022   History of Present Illness 81yo female presenting to [facility] ED on 5/17 from PCP office secondary to swelling of left arm and face. 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 Of note pt with recent ED visit 5/14 for fatigue and was discharged without admission with recommendation to see PCP.  PMH: GERD, HTN, HLD, asthma, stage 3 adenocarcinoma of the lung s/p radiation, hypothyroidism, dementia.    PT Comments    Pt agreeable to working with therapy. She reported general weakness and fatigue during session. Will continue to follow.    Recommendations for follow up therapy are one component of a multi-disciplinary discharge planning process, led by the attending physician.  Recommendations may be updated based on patient status, additional functional criteria and insurance authorization.  Follow Up Recommendations       Assistance Recommended at Discharge Intermittent Supervision/Assistance  Patient can return home with the following A little help with walking and/or transfers;A little help with bathing/dressing/bathroom;Assistance with cooking/housework;Direct supervision/assist for financial management;Direct supervision/assist for medications management;Assist for transportation;Help with stairs or ramp for entrance   Equipment Recommendations  None recommended by PT    Recommendations for Other Services       Precautions / Restrictions Precautions Precautions: Fall Restrictions Weight Bearing Restrictions: No     Mobility  Bed Mobility Overal bed mobility: Modified Independent             General bed mobility comments: Increased time    Transfers Overall transfer level: Modified independent                 General  transfer comment: Increased time    Ambulation/Gait Ambulation/Gait assistance: Supervision Gait Distance (Feet): 150 Feet (x2) Assistive device: Rollator (4 wheels) Gait Pattern/deviations: Step-through pattern, Decreased stride length       General Gait Details: No LOB with use of rollator. Pt denied dizziness. She did report fatigue and requested to rest mid distance.   Stairs             Wheelchair Mobility    Modified Rankin (Stroke Patients Only)       Balance Overall balance assessment: Mild deficits observed, not formally tested                                          Cognition Arousal/Alertness: Awake/alert Behavior During Therapy: WFL for tasks assessed/performed Overall Cognitive Status: Within Functional Limits for tasks assessed                                          Exercises      General Comments        Pertinent Vitals/Pain Pain Assessment Pain Assessment: No/denies pain    Home Living                          Prior Function            PT Goals (current goals can now be found in the care plan section) Progress towards PT goals: Progressing toward goals    Frequency  Min 1X/week      PT Plan Current plan remains appropriate    Co-evaluation              AM-PAC PT "6 Clicks" Mobility   Outcome Measure  Help needed turning from your back to your side while in a flat bed without using bedrails?: None Help needed moving from lying on your back to sitting on the side of a flat bed without using bedrails?: None Help needed moving to and from a bed to a chair (including a wheelchair)?: None Help needed standing up from a chair using your arms (e.g., wheelchair or bedside chair)?: None Help needed to walk in hospital room?: A Little Help needed climbing 3-5 steps with a railing? : A Little 6 Click Score: 22    End of Session Equipment Utilized During Treatment: Gait  belt Activity Tolerance: Patient tolerated treatment well Patient left: in chair;with call bell/phone within reach;with family/visitor present   PT Visit Diagnosis: Muscle weakness (generalized) (M62.81);Difficulty in walking, not elsewhere classified (R26.2)     Time: 5284-1324 PT Time Calculation (min) (ACUTE ONLY): 15 min  Charges:  $Gait Training: 8-22 mins                         Faye Ramsay, PT Acute Rehabilitation  Office: [phone]

## 2022-10-08 NOTE — Progress Notes (Signed)
  Echocardiogram 2D Echocardiogram has been performed.  [name] 10/08/2022, 3:13 PM

## 2022-10-09 ENCOUNTER — Inpatient Hospital Stay (HOSPITAL_COMMUNITY): Payer: Medicare HMO

## 2022-10-09 ENCOUNTER — Other Ambulatory Visit (HOSPITAL_COMMUNITY): Payer: Medicare HMO

## 2022-10-09 DIAGNOSIS — J9601 Acute respiratory failure with hypoxia: Secondary | ICD-10-CM | POA: Diagnosis not present

## 2022-10-09 LAB — CBC
HCT: 31.7 % — ABNORMAL LOW (ref 36.0–46.0)
Hemoglobin: 10.5 g/dL — ABNORMAL LOW (ref 12.0–15.0)
MCH: 27.9 pg (ref 26.0–34.0)
MCHC: 33.1 g/dL (ref 30.0–36.0)
MCV: 84.3 fL (ref 80.0–100.0)
Platelets: 193 10*3/uL (ref 150–400)
RBC: 3.76 MIL/uL — ABNORMAL LOW (ref 3.87–5.11)
RDW: 16.6 % — ABNORMAL HIGH (ref 11.5–15.5)
WBC: 8.8 10*3/uL (ref 4.0–10.5)
nRBC: 0 % (ref 0.0–0.2)

## 2022-10-09 LAB — CULTURE, BLOOD (ROUTINE X 2)
Special Requests: ADEQUATE
Special Requests: ADEQUATE

## 2022-10-09 LAB — IRON AND TIBC
Iron: 12 ug/dL — ABNORMAL LOW (ref 28–170)
Saturation Ratios: 5 % — ABNORMAL LOW (ref 10.4–31.8)
TIBC: 244 ug/dL — ABNORMAL LOW (ref 250–450)
UIBC: 232 ug/dL

## 2022-10-09 LAB — BASIC METABOLIC PANEL
Anion gap: 10 (ref 5–15)
BUN: 11 mg/dL (ref 8–23)
CO2: 25 mmol/L (ref 22–32)
Calcium: 8.6 mg/dL — ABNORMAL LOW (ref 8.9–10.3)
Chloride: 99 mmol/L (ref 98–111)
Creatinine, Ser: 0.8 mg/dL (ref 0.44–1.00)
GFR, Estimated: >60 mL/min (ref >60)
Glucose, Bld: 105 mg/dL — ABNORMAL HIGH (ref 70–99)
Potassium: 3.5 mmol/L (ref 3.5–5.1)
Sodium: 134 mmol/L — ABNORMAL LOW (ref 135–145)

## 2022-10-09 LAB — FOLATE: Folate: 4.6 ng/mL — ABNORMAL LOW (ref >5.9)

## 2022-10-09 LAB — CYTOLOGY - NON PAP

## 2022-10-09 LAB — FERRITIN: Ferritin: 86 ng/mL (ref 11–307)

## 2022-10-09 MED ORDER — FUROSEMIDE 20 MG PO TABS: 20.0000 mg | ORAL_TABLET | Freq: Every day | ORAL | 0 refills | Status: DC | PRN

## 2022-10-09 MED ORDER — FOLIC ACID 1 MG PO TABS
1.0000 mg | ORAL_TABLET | Freq: Every day | ORAL | Status: DC
  Administered 2022-10-09: 1 mg via ORAL
  Filled 2022-10-09: qty 1

## 2022-10-09 MED ORDER — FUROSEMIDE 20 MG PO TABS
20.0000 mg | ORAL_TABLET | Freq: Every day | ORAL | Status: DC
  Administered 2022-10-09: 20 mg via ORAL
  Filled 2022-10-09: qty 1

## 2022-10-09 MED ORDER — POTASSIUM CHLORIDE CRYS ER 20 MEQ PO TBCR
40.0000 meq | EXTENDED_RELEASE_TABLET | Freq: Once | ORAL | Status: AC
  Administered 2022-10-09: 40 meq via ORAL
  Filled 2022-10-09: qty 2

## 2022-10-09 MED ORDER — HEPARIN SOD (PORK) LOCK FLUSH 100 UNIT/ML IV SOLN
500.0000 [IU] | INTRAVENOUS | Status: AC | PRN
  Administered 2022-10-09: 500 [IU]
  Filled 2022-10-09: qty 5

## 2022-10-09 MED ORDER — CEFDINIR 300 MG PO CAPS: 300.0000 mg | ORAL_CAPSULE | Freq: Two times a day (BID) | ORAL | 0 refills | Status: AC

## 2022-10-09 MED ORDER — LEVOTHYROXINE SODIUM 50 MCG PO TABS: 50.0000 ug | ORAL_TABLET | Freq: Every day | ORAL | 0 refills | Status: DC

## 2022-10-09 MED ORDER — POTASSIUM CHLORIDE CRYS ER 20 MEQ PO TBCR: 40.0000 meq | EXTENDED_RELEASE_TABLET | Freq: Every day | ORAL | 0 refills | Status: DC | PRN

## 2022-10-09 MED ORDER — CYANOCOBALAMIN 1000 MCG PO TABS: 1000.0000 ug | ORAL_TABLET | Freq: Every day | ORAL | 0 refills | Status: AC

## 2022-10-09 MED ORDER — AZITHROMYCIN 500 MG PO TABS: 500.0000 mg | ORAL_TABLET | Freq: Every day | ORAL | 0 refills | Status: AC

## 2022-10-09 MED ORDER — FOLIC ACID 1 MG PO TABS: 1.0000 mg | ORAL_TABLET | Freq: Every day | ORAL | 0 refills | Status: DC

## 2022-10-09 MED ORDER — SODIUM CHLORIDE 0.9 % IV SOLN
250.0000 mg | Freq: Once | INTRAVENOUS | Status: AC
  Administered 2022-10-09: 250 mg via INTRAVENOUS
  Filled 2022-10-09: qty 20

## 2022-10-09 NOTE — Discharge Summary (Signed)
Physician Discharge Summary   Patient: Angelica [name] MRN: [id number] DOB: [birth year]  Admit date:     10/05/2022  Discharge date: 10/09/22  Discharge Physician: Alba Cory   PCP: [name], MD   Recommendations at discharge:   Needs to follow up  Cytology pleural fluid.  Adjust lasix as needed for edema.  Needs repeat chest x ray in 2-4 weeks to follow pleural effusion.  She will benefit form further IV iron.   Discharge Diagnoses: Principal Problem:   Acute respiratory failure with hypoxia (HCC) Active Problems:   Edema   Bilateral pleural effusion   Weakness   Hypothyroidism   Adenocarcinoma of left lung, stage 3 (HCC)   Asthma, severe persistent, well-controlled   HTN (hypertension)   Iron deficiency   History of stroke   Dementia (HCC)   HLD (hyperlipidemia)   GERD (gastroesophageal reflux disease)   Other allergic rhinitis  Resolved Problems:   Lactic acidosis   Glaucoma   Drug-induced hypothyroidism  Hospital Course: 82 year old with past medical history significant for GERD, hypertension, hyperlipidemia, IDA, asthma, stage III adenocarcinoma of the lung, hypothyroidism, dementia who was sent to the ED from her PCP office due to swelling of the left arm and face.  Symptoms started a week prior to admission, with swelling of the left eye.  Thought to be related to allergies.  She presented to the ED on 5/14 because she was not feeling well yet only abnormal finding was high TSH.  Patient has been off of her Synthroid and this was resumed.     Evaluation in the ED CTA chest negative for PE,  moderate to large bilateral pleural effusion with associated passive atelectasis.  Diffuse low-grade infiltrative edema in the mediastinum and also in the SVC tissue suggesting there is spacing of fluid.  CT soft tissue neck showed diffuse anasarca  which may be slightly more asymmetric in the left neck compared to the right. No definite underlying lesion is  visualized.  Assessment and Plan: 1-Acute hypoxic respiratory failure: Bilateral pleural effusion, worse left , Transudate pleural fluid.  -Patient presents with 2 weeks history of worsening fatigue, found to have edema face left arm, in the ED she was found to be hypoxic oxygen sat 88 on room air.  Patient with bilateral pleural effusion and diffuse third spacing of fluid. -IR consulted for thoracentesis.  -She develops leukocytosis. Started on  cover for PNA. - Treated  with IV ceftriaxone and Azithromycin while in the hospital , received 4 days antibiotics. [name]. . Needs 2 days azithromycin.  -[name].  -BNP normal.  -Underwent right side  thoracentesis on 5/19 yielding 400 cc fluids. LDH, Protein ratio consistent with transudate.  ECHO normal EF, indeterminate diastolic dysfunction.  -received IV lasix and albumin while in the hospital. [name].  Repeated chest x 5/21 post thoracentesis, no effusion on the right, small left pleural effusion.    Edema, anasarca: -CTA chest: Suggestive edema in mediastinum  suggesting third spacing of fluid.  -CT soft tissue of neck with no seen obstruction, diffuse anasarca. No obstruction of venous system  Received IV lasix.   discharge on PRN lasix.   Weakness: TSH normal limits.  Needs PT>  B 12: low, started IM injection.  Discharge on oral B 12 supplement.    Hypothyroidism: TSH now normal, was elevated 4/18 Per daughter patient is on Synthroid 50 mcg, dose  adjusted.    Adenocarcinoma of the left [place] stage III: Status post chemo and radiation.  Initial chemo in 2022 Continue follow-up outpatient Presents with pleural effusion, Cytology pending. Dr Arbutus Ped will help arrange follow up    Asthma, severe persistent, well-controlled: On Dulera, Nebulizer.  On Benralizumab q8 weeks    Hypertension: Hold Norvasc, SBP normal. Discharge on PRN lasix.    Iron  deficiency anemia: Hb mildly decreased to 9.6. Iron low, she will received IV iron prior to discharge.  Replete B12.  Folic Acid low, started oral folic acid.    History  stroke: On Plavix.    Dementia: Continue with Namenda.    Hyperlipidemia: On Pravachol    GERD:  PPI.    Other allergic rhinitis Continue flonase/zyrtec daily    Leucocytosis; resolved Will cover for PNA. Plan to complete  Denies abdominal pain.  Blood culture: No growth to date.    Hypokalemia; Replaced.         Consultants: None Procedures performed: Thoracentesis.  Disposition: Home Diet recommendation:  Discharge Diet Orders (From admission, onward)     Start     Ordered   10/09/22 0000  Diet - low sodium heart healthy        10/09/22 1131           Cardiac diet DISCHARGE MEDICATION: Allergies as of 10/09/2022       Reactions   Asa Buff (mag [buffered Aspirin] Other (See Comments)   Excessive sweating   Ensure [nutritional Supplements]    Or boost - upset stomach    Fish Allergy Other (See Comments)   Unknown reaction, but allergic   Other Other (See Comments)   Gelcaps; gel-containing capsules; extended-release medication - upset stomach    Peanut-containing Drug Products Other (See Comments)   From allergy test   Penicillins Itching   Has patient had a PCN reaction causing immediate rash, facial/tongue/throat swelling, SOB or lightheadedness with hypotension: No Has patient had a PCN reaction causing severe rash involving mucus membranes or skin necrosis: No Has patient had a PCN reaction that required hospitalization No Has patient had a PCN reaction occurring within the last 10 years: No If all of the above answers are "NO", then may proceed with Cephalosporin use.   Shellfish-derived Products Other (See Comments)   From allergy test   Strawberry Extract Other (See Comments)   From allergy test         Medication List     STOP taking these medications     amLODipine 10 MG tablet Commonly known as: NORVASC   dextromethorphan 7.5 MG/5ML Syrp   Fasenra 30 MG/ML Sosy Generic drug: Benralizumab       TAKE these medications    acetaminophen 500 MG tablet Commonly known as: TYLENOL Take 500 mg by mouth every 6 (six) hours as needed for moderate pain.   albuterol 108 (90 Base) MCG/ACT inhaler Commonly known as: Ventolin HFA Inhale 2 puffs into the lungs every 4 (four) hours as needed for wheezing or shortness of breath.   alum & mag hydroxide-simeth 200-200-20 MG/5ML suspension Commonly known as: MAALOX/MYLANTA Take 15 mLs by mouth every 6 (six) hours as needed for indigestion or heartburn.   azithromycin 500 MG tablet Commonly known as: ZITHROMAX Take 1 tablet (500 mg total) by mouth daily for 2 days.   carboxymethylcellulose 0.5 % Soln Commonly known as: REFRESH PLUS Place 1 drop into both eyes daily as needed (Uses as needed).   cefdinir 300 MG  capsule Commonly known as: OMNICEF Take 1 capsule (300 mg total) by mouth 2 (two) times daily for 5 days.   clopidogrel 75 MG tablet Commonly known as: PLAVIX Take 1 tablet by mouth once daily   cyanocobalamin 1000 MCG tablet Take 1 tablet (1,000 mcg total) by mouth daily for 15 days.   diclofenac Sodium 1 % Gel Commonly known as: VOLTAREN Apply 4 g topically 4 (four) times daily.   EPINEPHrine 0.3 mg/0.3 mL Soaj injection Commonly known as: EpiPen 2-Pak Use as directed for severe allergic reaction   EQ Allergy Relief (Cetirizine) 10 MG tablet Generic drug: cetirizine Take 1 tablet by mouth once daily   fluticasone 50 MCG/ACT nasal spray Commonly known as: FLONASE Place 1 spray into both nostrils in the morning and at bedtime.   folic acid 1 MG tablet Commonly known as: FOLVITE Take 1 tablet (1 mg total) by mouth daily. Start taking on: Oct 10, 2022   furosemide 20 MG tablet Commonly known as: LASIX Take 1 tablet (20 mg total) by mouth daily as needed. For  increase swelling   ipratropium 0.06 % nasal spray Commonly known as: ATROVENT Place 2 sprays into both nostrils 4 (four) times daily as needed (nasal drainage).   levothyroxine 50 MCG tablet Commonly known as: SYNTHROID Take 1 tablet (50 mcg total) by mouth daily before breakfast. Start taking on: Oct 10, 2022   memantine 10 MG tablet Commonly known as: Namenda Take 1 tablet (10 mg total) by mouth 2 (two) times daily.   montelukast 10 MG tablet Commonly known as: SINGULAIR Take 1 tablet (10 mg total) by mouth at bedtime.   ondansetron 4 MG disintegrating tablet Commonly known as: ZOFRAN-ODT Take 1 tablet (4 mg total) by mouth every 8 (eight) hours as needed for nausea or vomiting.   pantoprazole 40 MG tablet Commonly known as: Protonix Take 1 tablet (40 mg total) by mouth daily.   PHILLIPS MILK OF MAGNESIA PO Take 15-30 mLs by mouth daily as needed (for constipation).   polyethylene glycol 17 g packet Commonly known as: MIRALAX / GLYCOLAX Take 17 g by mouth daily as needed for mild constipation.   potassium chloride SA 20 MEQ tablet Commonly known as: KLOR-CON M Take 2 tablets (40 mEq total) by mouth daily as needed. Take potasium days you take lasix.   pravastatin 80 MG tablet Commonly known as: PRAVACHOL Take 1 tablet by mouth once daily   Symbicort 80-4.5 MCG/ACT inhaler Generic drug: budesonide-formoterol INHALE 2 PUFFS BY MOUTH IN THE MORNING AND AT BEDTIME        Follow-up Information     [facility] Follow up.   Specialty: Home Health Services Why: Home Health Physical Therapy Contact information: [address] [place] [address] [phone]         Si Gaul, MD Follow up in 1 week(s).   Specialty: Oncology Contact information: [address] [place] [address] [phone]                Discharge Exam: Ceasar Mons Weights   10/05/22 0909  Weight: 66.7 kg   General; NAD Left arm swelling  decreased.   Condition at discharge: stable  The results of significant diagnostics from this hospitalization (including imaging, microbiology, ancillary and laboratory) are listed below for reference.   Imaging Studies: DG Chest 2 View  Result Date: 10/09/2022 CLINICAL DATA:  Pleural effusion EXAM: CHEST - 2 VIEW COMPARISON:  10/07/2022 FINDINGS: Small left pleural effusion  with left base atelectasis, similar prior study. No right effusion. No pneumothorax. Heart and mediastinal contours within normal limits. No acute bony abnormality. Right Port-A-Cath is unchanged. IMPRESSION: Small left pleural effusion with left base atelectasis, stable. Electronically Signed   By: Charlett Nose M.D.   On: 10/09/2022 09:02   ECHOCARDIOGRAM COMPLETE  Result Date: 10/08/2022    ECHOCARDIOGRAM REPORT   Patient Name:   Angelica [name] Date of Exam: 10/08/2022 Medical Rec #:  [id number]        Height:       62.0 in Accession #:    [id number]       Weight:       147.0 lb Date of Birth:  12/21/1940         BSA:          1.677 m Patient Age:    81 years         BP:           121/69 mmHg Patient Gender: F                HR:           83 bpm. Exam Location:  Inpatient Procedure: 2D Echo, Cardiac Doppler and Color Doppler Indications:    Abnormal ECG  History:        Patient has no prior history of Echocardiogram examinations.                 Signs/Symptoms:Edema; Risk Factors:Hypertension.  Sonographer:    Lucy Antigua Referring Phys: [id number] Jon Billings A Zaheer Wageman  Sonographer Comments: Small, right-sided pericardial effusion. Seen in subcostals. Pt Asymptomatic. IMPRESSIONS  1. Left ventricular ejection fraction, by estimation, is 55 to 60%. The left ventricle has normal function. The left ventricle has no regional wall motion abnormalities. There is mild concentric left ventricular hypertrophy. Left ventricular diastolic parameters are indeterminate.  2. Right ventricular systolic function is normal. The right ventricular size is  normal. Tricuspid regurgitation signal is inadequate for assessing PA pressure.  3. A small pericardial effusion is present. There is no evidence of cardiac tamponade.  4. The mitral valve is grossly normal. No evidence of mitral valve regurgitation. No evidence of mitral stenosis.  5. The aortic valve was not well visualized. Aortic valve regurgitation is not visualized. No aortic stenosis is present.  6. The inferior vena cava is normal in size with greater than 50% respiratory variability, suggesting right atrial pressure of 3 mmHg. Comparison(s): No prior Echocardiogram. FINDINGS  Left Ventricle: Left ventricular ejection fraction, by estimation, is 55 to 60%. The left ventricle has normal function. The left ventricle has no regional wall motion abnormalities. The left ventricular internal cavity size was normal in size. There is  mild concentric left ventricular hypertrophy. Left ventricular diastolic parameters are indeterminate. Right Ventricle: The right ventricular size is normal. Right vetricular wall thickness was not well visualized. Right ventricular systolic function is normal. Tricuspid regurgitation signal is inadequate for assessing PA pressure. Left Atrium: Left atrial size was normal in size. Right Atrium: Right atrial size was normal in size. Pericardium: A small pericardial effusion is present. There is no evidence of cardiac tamponade. Mitral Valve: The mitral valve is grossly normal. No evidence of mitral valve regurgitation. No evidence of mitral valve stenosis. Tricuspid Valve: The tricuspid valve is not well visualized. Tricuspid valve regurgitation is not demonstrated. No evidence of tricuspid stenosis. Aortic Valve: The aortic valve was not well visualized. Aortic valve regurgitation is not visualized. No  aortic stenosis is present. Aortic valve mean gradient measures 3.0 mmHg. Aortic valve peak gradient measures 7.4 mmHg. Aortic valve area, by VTI measures 1.92 cm. Pulmonic Valve: The  pulmonic valve was not well visualized. Pulmonic valve regurgitation is not visualized. No evidence of pulmonic stenosis. Aorta: The aortic root, ascending aorta, aortic arch and descending aorta are all structurally normal, with no evidence of dilitation or obstruction. Venous: The inferior vena cava is normal in size with greater than 50% respiratory variability, suggesting right atrial pressure of 3 mmHg. IAS/Shunts: The atrial septum is grossly normal.  LEFT VENTRICLE PLAX 2D LVIDd:         1.80 cm     Diastology LVIDs:         1.40 cm     LV e' medial:    4.24 cm/s LV PW:         1.20 cm     LV E/e' medial:  15.4 LV IVS:        1.00 cm     LV e' lateral:   4.13 cm/s LVOT diam:     1.80 cm     LV E/e' lateral: 15.8 LV SV:         43 LV SV Index:   26 LVOT Area:     2.54 cm  LV Volumes (MOD) LV vol d, MOD A4C: 27.9 ml LV vol s, MOD A4C: 14.9 ml LV SV MOD A4C:     27.9 ml RIGHT VENTRICLE RV S prime:     14.90 cm/s TAPSE (M-mode): 2.4 cm LEFT ATRIUM             Index        RIGHT ATRIUM          Index LA Vol (A2C):   17.5 ml 10.43 ml/m  RA Area:     5.94 cm LA Vol (A4C):   11.8 ml 7.04 ml/m   RA Volume:   9.45 ml  5.63 ml/m LA Biplane Vol: 14.7 ml 8.76 ml/m  AORTIC VALVE AV Area (Vmax):    1.70 cm AV Area (Vmean):   1.84 cm AV Area (VTI):     1.92 cm AV Vmax:           136.00 cm/s AV Vmean:          84.100 cm/s AV VTI:            0.224 m AV Peak Grad:      7.4 mmHg AV Mean Grad:      3.0 mmHg LVOT Vmax:         90.70 cm/s LVOT Vmean:        60.900 cm/s LVOT VTI:          0.169 m LVOT/AV VTI ratio: 0.75  AORTA Ao Root diam: 3.20 cm MITRAL VALVE MV Area (PHT): 4.06 cm    SHUNTS MV Decel Time: 187 msec    Systemic VTI:  0.17 m MV E velocity: 65.10 cm/s  Systemic Diam: 1.80 cm MV A velocity: 87.40 cm/s MV E/A ratio:  0.74 Jodelle Red MD Electronically signed by Jodelle Red MD Signature Date/Time: 10/08/2022/7:04:47 PM    Final    US THORACENTESIS ASP PLEURAL SPACE W/IMG GUIDE  Result  Date: 10/07/2022 INDICATION: Patient with history of lung adenocarcinoma, dyspnea, bilateral pleural effusions, right greater than left ; request received for diagnostic and therapeutic thoracentesis EXAM: ULTRASOUND GUIDED DIAGNOSTIC AND THERAPEUTIC RIGHT THORACENTESIS MEDICATIONS: 8 mL 1% lidocaine COMPLICATIONS: None immediate. PROCEDURE:  An ultrasound guided thoracentesis was thoroughly discussed with the patient/daughter and questions answered. The benefits, risks, alternatives and complications were also discussed. The patient/daughter understands and wishes to proceed with the procedure. Written consent was obtained. Ultrasound was performed to localize and mark an adequate pocket of fluid in the right chest. The area was then prepped and draped in the normal sterile fashion. 1% Lidocaine was used for local anesthesia. Under ultrasound guidance a 6 Fr Safe-T-Centesis catheter was introduced. Thoracentesis was performed. The catheter was removed and a dressing applied. FINDINGS: A total of approximately 450 cc of yellow fluid was removed. Samples were sent to the laboratory as requested by the clinical team. IMPRESSION: Successful ultrasound guided diagnostic and therapeutic right thoracentesis yielding 450 cc of pleural fluid. Performed by: Artemio Aly Electronically Signed   By: Marliss Coots M.D.   On: 10/07/2022 14:16   DG Chest 1 View  Result Date: 10/07/2022 CLINICAL DATA:  82 year old female status post ultrasound-guided right side thoracentesis this morning. EXAM: CHEST  1 VIEW COMPARISON:  Ultrasound images 0817 hours today. Chest radiographs yesterday. FINDINGS: Portable AP upright view at 0958 hours. No pneumothorax. Allowing for portable technique the right lung now is largely negative. Stable left veiling pleural effusion at the lung base. Stable cardiac size and mediastinal contours. Stable right chest power port. Sequelae of thyroidectomy. Negative visible bowel gas. No acute osseous  abnormality identified. IMPRESSION: 1. No pneumothorax following right thoracentesis. 2. Stable left pleural effusion. Electronically Signed   By: Odessa Fleming M.D.   On: 10/07/2022 10:08   DG Chest 2 View  Result Date: 10/06/2022 CLINICAL DATA:  Pleural effusion. Acute respiratory failure with hypoxia. Stage III adenocarcinoma of the lung. EXAM: CHEST - 2 VIEW COMPARISON:  10/02/2022 FINDINGS: RIGHT-sided PowerPort tip overlies the superior vena cava. Heart size is normal. There is increased opacity in the LEFT lung base, consisting of pleural effusion and atelectasis or consolidation. The RIGHT lung remains clear. No pulmonary edema. IMPRESSION: Increased opacity in the LEFT lung base, consisting of pleural effusion and atelectasis or consolidation. Electronically Signed   By: Norva Pavlov M.D.   On: 10/06/2022 08:40   DG Abd 1 View  Result Date: 10/06/2022 CLINICAL DATA:  82 year old female with generalized stomach pain. EXAM: ABDOMEN - 1 VIEW COMPARISON:  CTA chest 10/05/2022. CT Abdomen and Pelvis 10/19/2021. FINDINGS: AP view at 0531 hours. Bilateral pleural effusions better demonstrated by CTA yesterday. Excreted IV contrast in the urinary bladder. Non obstructed bowel gas pattern. Degenerative changes in the spine. No acute osseous abnormality identified. IMPRESSION: Non obstructed bowel gas pattern. Electronically Signed   By: Odessa Fleming M.D.   On: 10/06/2022 06:00   UE Venous Duplex ([facility] and [facility] ONLY)  Result Date: 10/05/2022 UPPER VENOUS STUDY  Patient Name:  Angelica [name]  Date of Exam:   10/05/2022 Medical Rec #: [id number]         Accession #:    [id number] Date of Birth: [birth year]          Patient Gender: F Patient Age:   89 years Exam Location:  [facility] Procedure:      VAS [place] UPPER EXTREMITY VENOUS DUPLEX Referring Phys: Lynden Oxford --------------------------------------------------------------------------------  Indications: Left arm, face, and hand swelling Risk Factors:  HX of Lung CA. Comparison Study: No previosu exams Performing Technologist: Jody Hill RVT, RDMS  Examination Guidelines: A complete evaluation includes B-mode imaging, spectral Doppler, color Doppler, and power Doppler as needed of all accessible portions  of each vessel. Bilateral testing is considered an integral part of a complete examination. Limited examinations for reoccurring indications may be performed as noted.  Right Findings: +----------+------------+---------+-----------+----------+-------+ RIGHT     CompressiblePhasicitySpontaneousPropertiesSummary +----------+------------+---------+-----------+----------+-------+ Subclavian               Yes       Yes                      +----------+------------+---------+-----------+----------+-------+ Limited visualization of subclavian due to port placement patent by color and doppler  Left Findings: +----------+------------+---------+-----------+----------+-------+ LEFT      CompressiblePhasicitySpontaneousPropertiesSummary +----------+------------+---------+-----------+----------+-------+ IJV           Full       Yes       Yes                      +----------+------------+---------+-----------+----------+-------+ Subclavian    Full       Yes       Yes                      +----------+------------+---------+-----------+----------+-------+ Axillary      Full       Yes       Yes                      +----------+------------+---------+-----------+----------+-------+ Brachial      Full       Yes       Yes                      +----------+------------+---------+-----------+----------+-------+ Radial        Full                                          +----------+------------+---------+-----------+----------+-------+ Ulnar         Full                                          +----------+------------+---------+-----------+----------+-------+ Cephalic      Full                                           +----------+------------+---------+-----------+----------+-------+ Basilic       Full       Yes       Yes                      +----------+------------+---------+-----------+----------+-------+  Summary:  Right: No evidence of thrombosis in the subclavian.  Left: No evidence of deep vein thrombosis in the upper extremity. No evidence of superficial vein thrombosis in the upper extremity.  *See table(s) above for measurements and observations.  Diagnosing physician: Gerarda Fraction Electronically signed by Gerarda Fraction on 10/05/2022 at 2:56:59 PM.    Final    CT Soft Tissue Neck W Contrast  Result Date: 10/05/2022 CLINICAL DATA:  Neck mass, nonpulsatile concern for left neck and face swelling EXAM: CT NECK WITH CONTRAST TECHNIQUE: Multidetector CT imaging of the neck was performed using the standard protocol following the bolus administration of intravenous contrast. RADIATION DOSE REDUCTION: This exam was performed according to the departmental dose-optimization  program which includes automated exposure control, adjustment of the mA and/or kV according to patient size and/or use of iterative reconstruction technique. CONTRAST:  OMNIPAQUE IOHEXOL 350 MG/ML SOLN COMPARISON:  None Available. FINDINGS: Limitations: Assessment is slightly limited due to motion artifact of the level of the oral and hypopharynx. Pharynx and larynx: The epiglottis is normal in appearance. Bilateral tonsils are normal in appearance. No evidence of parapharyngeal space soft tissue stranding. Salivary glands: No inflammation, mass, or stone. Thyroid: Heterogeneous and multinodular left thyroid lobe, not significantly changed compared to prior CT chest dated 04/11/2019. Lymph nodes: Within the limitations of motion artifact, no definite cervical lymphadenopathy is visualized. Vascular: Negative. Limited intracranial: Incompletely imaged Visualized orbits: Not imaged Mastoids and visualized paranasal sinuses: No middle ear or  mastoid effusion. There is mucosal thickening in the bilateral maxillary sinuses. Skeleton: Straightening of the normal cervical lordosis. No suspicious osseous lesions. Upper chest: Small bilateral pleural effusions, new from prior exam. Right chest port in place with tip visualized to the level of the mid to lower SVC. See separately dictated chest for findings below the thoracic inlet. Other: There is diffuse anasarca there may be slightly more asymmetric in the left neck compared to the right. No definite underlying lesion is visualized. IMPRESSION: 1. Diffuse anasarca, which may be slightly more asymmetric in the left neck compared to the right. No definite underlying lesion is visualized. 2. Small bilateral pleural effusions, new from prior exam. See separately dictated chest for findings below the thoracic inlet. Electronically Signed   By: Lorenza Cambridge M.D.   On: 10/05/2022 14:27   CT Angio Chest PE W and/or Wo Contrast  Result Date: 10/05/2022 CLINICAL DATA:  Left lower neck swelling. Cancer history. Query pulmonary embolus or SVC syndrome. EXAM: CT ANGIOGRAPHY CHEST WITH CONTRAST TECHNIQUE: Multidetector CT imaging of the chest was performed using the standard protocol during bolus administration of intravenous contrast. Multiplanar CT image reconstructions and MIPs were obtained to evaluate the vascular anatomy. RADIATION DOSE REDUCTION: This exam was performed according to the departmental dose-optimization program which includes automated exposure control, adjustment of the mA and/or kV according to patient size and/or use of iterative reconstruction technique. CONTRAST:  OMNIPAQUE IOHEXOL 350 MG/ML SOLN COMPARISON:  07/10/2022 FINDINGS: Cardiovascular: No filling defect is identified in the pulmonary arterial tree to suggest pulmonary embolus. Coronary, aortic arch, and branch vessel atherosclerotic vascular disease. No thoracic aortic aneurysm or dissection is identified. Right Port-A-Cath  tip: SVC. Because today's exam is timed for pulmonary arterial opacification, the systemic veins are not opacified and accordingly assessment of patency is limited. Mediastinum/Nodes: No obvious mass lesion impinging on the SVC, brachiocephalic vein, or left axillary vein. There is diffuse low-grade infiltrative edema in the mediastinum and also in the subcutaneous tissues bilaterally. Lungs/Pleura: Moderate to large bilateral pleural effusions with associated passive atelectasis. Upper Abdomen: Unremarkable Musculoskeletal: Thoracic spondylosis. Review of the MIP images confirms the above findings. IMPRESSION: 1. No filling defect is identified in the pulmonary arterial tree to suggest pulmonary embolus. 2. Moderate to large bilateral pleural effusions with associated passive atelectasis. 3. Diffuse low-grade infiltrative edema in the mediastinum and also in the subcutaneous tissues bilaterally, suggesting third spacing of fluid. 4. No obvious mass lesion impinging on the SVC, brachiocephalic vein, or left axillary vein. Please note that systemic venous structures are not opacified on today's exam, which was timed for pulmonary arterial opacification. Electronically Signed   By: Gaylyn Rong M.D.   On: 10/05/2022 14:08  DG Chest Port 1 View  Result Date: 10/02/2022 CLINICAL DATA:  Fatigue. Nausea and weakness starting yesterday. History of cancer, asthma, hypertension, and smoking. EXAM: PORTABLE CHEST 1 VIEW COMPARISON:  07/10/2022 FINDINGS: Heart size and pulmonary vascularity are normal. Power port type central venous catheter with tip over the mid SVC region. No pneumothorax. Surgical clips in the base of the neck. Lungs are clear and expanded. No pleural effusions. No pneumothorax. Mediastinal contours appear intact. Calcification of the aorta. Degenerative changes in the spine and shoulders. IMPRESSION: No active disease. Electronically Signed   By: Burman Nieves M.D.   On: 10/02/2022 09:55     Microbiology: Results for orders placed or performed during the hospital encounter of 10/05/22  Culture, blood (Routine X 2) w Reflex to ID Panel     Status: None (Preliminary result)   Collection Time: 10/06/22  4:35 PM   Specimen: BLOOD RIGHT ARM  Result Value Ref Range Status   Specimen Description   Final    BLOOD RIGHT ARM Performed at [facility], [address] [place], [place] [address]    Special Requests   Final    AEROBIC BOTTLE ONLY Blood Culture adequate volume Performed at [facility], [address] [place], [place] [address]    Culture   Final    NO GROWTH 3 DAYS Performed at [facility], [address], [place], [place] [address]    Report Status PENDING  Incomplete  Culture, blood (Routine X 2) w Reflex to ID Panel     Status: None (Preliminary result)   Collection Time: 10/06/22  4:44 PM   Specimen: BLOOD LEFT ARM  Result Value Ref Range Status   Specimen Description   Final    BLOOD LEFT ARM Performed at [facility], [address] [place], [place] [address]    Special Requests   Final    AEROBIC BOTTLE ONLY Blood Culture adequate volume Performed at [facility], [address], [place], [place] [address]    Culture   Final    NO GROWTH 3 DAYS Performed at [facility], [address] [place], [place] [address]    Report Status PENDING  Incomplete    Labs: CBC: Recent Labs  Lab 10/05/22 1040 10/06/22 0338 10/06/22 1134 10/07/22 0304 10/08/22 0935 10/09/22 0046  WBC 8.0 31.8* 25.0* 12.8* 7.5 8.8  NEUTROABS 6.1  --  22.0*  --   --   --   HGB 11.3* 11.0* 10.8* 10.5* 9.6* 10.5*  HCT 34.9* 32.9* 32.9* 32.2* 29.0* 31.7*  MCV 85.7 84.6 84.6 85.4 86.8 84.3  PLT 248 234 236 203 199 193   Basic Metabolic Panel: Recent Labs  Lab 10/05/22 1040 10/06/22 0338 10/07/22 0304 10/08/22 0935 10/09/22 0046  NA 137 135 139 136 134*  K 3.7 3.7 3.1* 3.5 3.5   CL 106 104 105 107 99  CO2 23 23 23 22 25   GLUCOSE 94 99 86 119* 105*  BUN 8 10 11 11 11   CREATININE 0.66 0.86 0.89 0.76 0.80  CALCIUM 8.6* 8.3* 8.3* 8.2* 8.6*   Liver Function Tests: Recent Labs  Lab 10/05/22 1040 10/07/22 0304 10/07/22 1037  AST 15 15 17   ALT 12 11 12   ALKPHOS 65 61 66  BILITOT 0.7 0.5 0.5  PROT 7.0 6.5 7.1  ALBUMIN 3.0* 2.8* 2.9*   CBG: No results for input(s): "GLUCAP" in the last 168 hours.  Discharge time  spent: greater than 30 minutes.  Signed: Alba Cory, MD [facility] 10/09/2022

## 2022-10-09 NOTE — Progress Notes (Signed)
Pt has DC order after the infusion of IV iron and IV Rocephin. IV team de accessed the port. AVS was given and explained to daughter, all questions answered. DC pt via wheelchair.

## 2022-10-09 NOTE — Progress Notes (Signed)
Mobility Specialist - Progress Note   10/09/22 1553  Mobility  Activity Ambulated with assistance to bathroom  Level of Assistance Standby assist, set-up cues, supervision of patient - no hands on  Assistive Device Front wheel walker  Distance Ambulated (ft) 20 ft  Activity Response Tolerated well  Mobility Referral Yes  $Mobility charge 1 Mobility  Mobility Specialist Start Time (ACUTE ONLY) [id number]  Mobility Specialist Stop Time (ACUTE ONLY) 0350  Mobility Specialist Time Calculation (min) (ACUTE ONLY) 12 min   Pt received in bed requesting assistance to the bathroom for a BM. No complaints during session. Pt to bed after session with all needs met.  [facility]

## 2022-10-10 ENCOUNTER — Ambulatory Visit: Payer: Self-pay

## 2022-10-10 ENCOUNTER — Telehealth: Payer: Self-pay

## 2022-10-10 ENCOUNTER — Telehealth: Payer: Self-pay | Admitting: Physician Assistant

## 2022-10-10 LAB — CULTURE, BLOOD (ROUTINE X 2)

## 2022-10-10 NOTE — Transitions of Care (Post Inpatient/ED Visit) (Signed)
10/10/2022  Name: Angelica [name] MRN: [id number] DOB: Sep 11, 1940  Today's TOC FU Call Status:    Transition Care Management Follow-up Telephone Call Date of Discharge: 10/09/22 Discharge Facility: [facility] Type of Discharge: Inpatient Admission Primary Inpatient Discharge Diagnosis:: Acute Respiratory Failure with Hypoxia How have you been since you were released from the hospital?: Better (Per patiients daughter, Vashti Hey) Any questions or concerns?: No  Items Reviewed: Did you receive and understand the discharge instructions provided?: Yes Medications obtained,verified, and reconciled?: Yes (Medications Reviewed) Any new allergies since your discharge?: No Dietary orders reviewed?: Yes Type of Diet Ordered:: Low sodium, heart healthy Do you have support at home?: Yes People in Home: child(ren), adult Name of Support/Comfort Primary Source: [place]  Medications Reviewed Today: Medications Reviewed Today     Reviewed by Jodelle Gross, RN (Case Manager) on 10/10/22 at 1243  Med List Status: <None>   Medication Order Taking? Sig Documenting Provider Last Dose Status Informant  acetaminophen (TYLENOL) 500 MG tablet [id number] Yes Take 500 mg by mouth every 6 (six) hours as needed for moderate pain. [provider] Taking Active Child  albuterol (VENTOLIN HFA) 108 (90 Base) MCG/ACT inhaler [id number] Yes Inhale 2 puffs into the lungs every 4 (four) hours as needed for wheezing or shortness of breath. Marcelyn Bruins, MD Taking Active Child  alum & mag hydroxide-simeth (MAALOX/MYLANTA) 200-200-20 MG/5ML suspension [id number] No Take 15 mLs by mouth every 6 (six) hours as needed for indigestion or heartburn. [provider] Unknown Active Child  azithromycin (ZITHROMAX) 500 MG tablet [id number] Yes Take 1 tablet (500 mg total) by mouth daily for 2 days. Alba Cory, MD Taking Active   Benralizumab SOSY 30 mg [id number]   Marcelyn Bruins,  MD  Active   carboxymethylcellulose (REFRESH PLUS) 0.5 % SOLN [id number] No Place 1 drop into both eyes daily as needed (Uses as needed). [provider] Unknown Active Child  cefdinir (OMNICEF) 300 MG capsule [id number] Yes Take 1 capsule (300 mg total) by mouth 2 (two) times daily for 5 days. Regalado, Prentiss Bells, MD Taking Active   clopidogrel (PLAVIX) 75 MG tablet [id number] Yes Take 1 tablet by mouth once daily [name], MD Taking Active Child  cyanocobalamin 1000 MCG tablet [id number] Yes Take 1 tablet (1,000 mcg total) by mouth daily for 15 days. Regalado, Belkys A, MD Taking Active   diclofenac Sodium (VOLTAREN) 1 % GEL [id number] No Apply 4 g topically 4 (four) times daily. Rodolph Bong, MD Unknown Active Child  EPINEPHrine (EPIPEN 2-PAK) 0.3 mg/0.3 mL IJ SOAJ injection [id number] No Use as directed for severe allergic reaction Gwenlyn Fudge, FNP Unknown Active Child  EQ ALLERGY RELIEF, CETIRIZINE, 10 MG tablet [id number] Yes Take 1 tablet by mouth once daily Marcelyn Bruins, MD Taking Active Child  fluticasone [facility]) 50 MCG/ACT nasal spray [id number] Yes Place 1 spray into both nostrils in the morning and at bedtime. Marcelyn Bruins, MD Taking Active Child  folic acid (FOLVITE) 1 MG tablet [id number] Yes Take 1 tablet (1 mg total) by mouth daily. Regalado, Belkys A, MD Taking Active   furosemide (LASIX) 20 MG tablet [id number] Yes Take 1 tablet (20 mg total) by mouth daily as needed. For increase swelling Regalado, Belkys A, MD Taking Active   ipratropium (ATROVENT) 0.06 % nasal spray [id number] No Place 2 sprays into both nostrils 4 (four) times daily as needed (nasal drainage). Marcelyn Bruins, MD Unknown Active Child  levothyroxine (SYNTHROID) 50  MCG tablet [id number] Yes Take 1 tablet (50 mcg total) by mouth daily before breakfast. Regalado, Belkys A, MD Taking Active   Magnesium Hydroxide (PHILLIPS MILK OF MAGNESIA PO) [id number] No Take 15-30 mLs by  mouth daily as needed (for constipation). [provider] Unknown Active Child  memantine (NAMENDA) 10 MG tablet [id number] Yes Take 1 tablet (10 mg total) by mouth 2 (two) times daily. Micki Riley, MD Taking Active Child  montelukast (SINGULAIR) 10 MG tablet [id number] Yes Take 1 tablet (10 mg total) by mouth at bedtime. Marcelyn Bruins, MD Taking Active Child  ondansetron (ZOFRAN-ODT) 4 MG disintegrating tablet [id number] No Take 1 tablet (4 mg total) by mouth every 8 (eight) hours as needed for nausea or vomiting. Gwenlyn Fudge, FNP Unknown Active Child  pantoprazole (PROTONIX) 40 MG tablet [id number] Yes Take 1 tablet (40 mg total) by mouth daily. [name], MD Taking Active Child  polyethylene glycol (MIRALAX / GLYCOLAX) 17 g packet [id number] No Take 17 g by mouth daily as needed for mild constipation. [provider] Unknown Active Child  potassium chloride SA (KLOR-CON M) 20 MEQ tablet [id number] Yes Take 2 tablets (40 mEq total) by mouth daily as needed. Take potasium days you take lasix. Regalado, Prentiss Bells, MD Taking Active   pravastatin (PRAVACHOL) 80 MG tablet [id number] Yes Take 1 tablet by mouth once daily [name], MD Taking Active Child  SYMBICORT 80-4.5 MCG/ACT inhaler [id number] Yes INHALE 2 PUFFS BY MOUTH IN THE MORNING AND AT BEDTIME Marcelyn Bruins, MD Taking Active Child  Med List Note Marsa Aris 10/19/21 1516): Daughter Vashti Hey helps with medications.            Home Care and Equipment/Supplies: Were Home Health Services Ordered?: Yes Name of Home Health Agency:: [facility] Has Agency set up a time to come to your home?: Yes First Home Health Visit Date: 10/11/22 Any new equipment or medical supplies ordered?: No  Functional Questionnaire: Do you need assistance with bathing/showering or dressing?: Yes Do you need assistance with meal preparation?: Yes Do you need assistance with eating?: No Do you have  difficulty maintaining continence: No Do you need assistance with getting out of bed/getting out of a chair/moving?: No Do you have difficulty managing or taking your medications?: Yes  Follow up appointments reviewed: PCP Follow-up appointment confirmed?: No MD Provider Line Number:[phone] Given:  ([place] to call Dr. Raphael Gibney office) Specialist Hospital Follow-up appointment confirmed?: No Reason Specialist Follow-Up Not Confirmed: Patient has Specialist Provider Number and will Call for Appointment (Daughter to call and make appt with Dr. Arbutus Ped) Do you need transportation to your follow-up appointment?: No Do you understand care options if your condition(s) worsen?: Yes-patient verbalized understanding  Jodelle Gross, RN, BSN, CCM Care Management Coordinator [facility] Phone: [phone]/Fax: [phone]

## 2022-10-10 NOTE — Progress Notes (Unsigned)
[facility] OFFICE PROGRESS NOTE  [name], MD [address] [place] [address]  DIAGNOSIS: Stage IIIB (T2 a, N3, M0) non-small cell lung cancer, adenocarcinoma presented with left lower lobe lung mass in addition to left hilar and mediastinal as well as right and left cervical lymphadenopathy diagnosed in January 2022   PRIOR THERAPY: 1) Concurrent chemoradiation with weekly carboplatin for AUC of 2 and paclitaxel 45 mg/M2.  Status post 5 cycles.  Last dose was given August 01, 2020. 2) Consolidation treatment with immunotherapy with Imfinzi 1500 mg IV every 4 weeks.  First dose September 06, 2020.  Status post 3 cycles.  This treatment was discontinued secondary to intolerance and hospital admission with sepsis.  CURRENT THERAPY: Observation   INTERVAL HISTORY: Angelica [name] 82 y.o. female returns to the clinic today for follow-up visit accompanied by her daughter.  The patient is followed by the clinic for history of stage III lung cancer which was diagnosed in 2022.  The patient is currently on observation.  She underwent chemoradiation 2022 followed by 3 cycles of consolidation immunotherapy which was discontinued due to intolerance.  She has been on observation since 2022.  She was last seen by Dr. Arbutus Ped on 07/12/2022.  In the interval since last being seen, the patient presented to the emergency room from her PCPs office on 10/05/2022 for the chief complaint of left facial, neck, and arm swelling.  Of note, few days prior to her ER visit, she had also been to the emergency room for swelling of the left eye which was felt it could be related to her off her Synthroid which she has since resumed.  She also had fatigue and shortness of breath.  Her workup in the emergency room included a CTA which was negative for any thromboembolism.  Her scan did showed diffuse anasarca and moderate to large pleural effusion and low-grade infiltrative edema in the mediastinum.  She was  also hypoxic at 88% on room air.  Her BNP was not elevated.  Her albumin was stable but persistently low at 3.0.  During her admission, she was treated for pneumonia with antibiotics.  She also underwent a thoracentesis which yielded 400 cc of fluid.  The final cytology did not show any malignant cells.  The cytology only showed benign reactive mesothelial cells and chronic inflammatory cells.  Her echo showed normal EF.  She received IV Lasix and albumin while in the hospital and discharged on as needed Lasix.  Overall, the patient is feeling ***today.  She denies any fever, chills, night sweats, or unexplained weight loss.  Shortness of breath?  Denies any cough, chest pain, or hemoptysis.  Denies any nausea, vomiting, diarrhea, or constipation.  Denies any headache or visual changes.  She is here today for evaluation and to discuss the next steps in her care.  MEDICAL HISTORY: Past Medical History:  Diagnosis Date   Arthritis    Asthma    Bowel obstruction (HCC)    Cancer (HCC)    Environmental allergies    GERD (gastroesophageal reflux disease)    Glaucoma 05/09/2017   H. pylori infection    History of radiation therapy 07/05/20-08/05/20   Left lung IMRT Dr. Roselind Messier    HLD (hyperlipidemia) 01/16/2019   Hypertension    Iron deficiency anemia    Osteopenia 05/26/2017   Thyroid disease    Urticaria 07/25/2018    ALLERGIES:  is allergic to asa buff (mag [buffered aspirin], ensure [nutritional supplements],  fish allergy, other, peanut-containing drug products, penicillins, shellfish-derived products, and strawberry extract.  MEDICATIONS:  Current Outpatient Medications  Medication Sig Dispense Refill   acetaminophen (TYLENOL) 500 MG tablet Take 500 mg by mouth every 6 (six) hours as needed for moderate pain.     albuterol (VENTOLIN HFA) 108 (90 Base) MCG/ACT inhaler Inhale 2 puffs into the lungs every 4 (four) hours as needed for wheezing or shortness of breath. 18 g 1   alum & mag  hydroxide-simeth (MAALOX/MYLANTA) 200-200-20 MG/5ML suspension Take 15 mLs by mouth every 6 (six) hours as needed for indigestion or heartburn.     azithromycin (ZITHROMAX) 500 MG tablet Take 1 tablet (500 mg total) by mouth daily for 2 days. 2 tablet 0   carboxymethylcellulose (REFRESH PLUS) 0.5 % SOLN Place 1 drop into both eyes daily as needed (Uses as needed).     cefdinir (OMNICEF) 300 MG capsule Take 1 capsule (300 mg total) by mouth 2 (two) times daily for 5 days. 10 capsule 0   clopidogrel (PLAVIX) 75 MG tablet Take 1 tablet by mouth once daily 90 tablet 3   cyanocobalamin 1000 MCG tablet Take 1 tablet (1,000 mcg total) by mouth daily for 15 days. 15 tablet 0   diclofenac Sodium (VOLTAREN) 1 % GEL Apply 4 g topically 4 (four) times daily. 100 g 0   EPINEPHrine (EPIPEN 2-PAK) 0.3 mg/0.3 mL IJ SOAJ injection Use as directed for severe allergic reaction 2 each 1   EQ ALLERGY RELIEF, CETIRIZINE, 10 MG tablet Take 1 tablet by mouth once daily 90 tablet 0   fluticasone (FLONASE) 50 MCG/ACT nasal spray Place 1 spray into both nostrils in the morning and at bedtime. 16 g 5   folic acid (FOLVITE) 1 MG tablet Take 1 tablet (1 mg total) by mouth daily. 90 tablet 0   furosemide (LASIX) 20 MG tablet Take 1 tablet (20 mg total) by mouth daily as needed. For increase swelling 30 tablet 0   ipratropium (ATROVENT) 0.06 % nasal spray Place 2 sprays into both nostrils 4 (four) times daily as needed (nasal drainage). 15 mL 5   levothyroxine (SYNTHROID) 50 MCG tablet Take 1 tablet (50 mcg total) by mouth daily before breakfast. 10 tablet 0   Magnesium Hydroxide (PHILLIPS MILK OF MAGNESIA PO) Take 15-30 mLs by mouth daily as needed (for constipation).     memantine (NAMENDA) 10 MG tablet Take 1 tablet (10 mg total) by mouth 2 (two) times daily. 60 tablet 5   montelukast (SINGULAIR) 10 MG tablet Take 1 tablet (10 mg total) by mouth at bedtime. 30 tablet 5   ondansetron (ZOFRAN-ODT) 4 MG disintegrating tablet Take  1 tablet (4 mg total) by mouth every 8 (eight) hours as needed for nausea or vomiting. 10 tablet 0   pantoprazole (PROTONIX) 40 MG tablet Take 1 tablet (40 mg total) by mouth daily. 90 tablet 1   polyethylene glycol (MIRALAX / GLYCOLAX) 17 g packet Take 17 g by mouth daily as needed for mild constipation.     potassium chloride SA (KLOR-CON M) 20 MEQ tablet Take 2 tablets (40 mEq total) by mouth daily as needed. Take potasium days you take lasix. 10 tablet 0   pravastatin (PRAVACHOL) 80 MG tablet Take 1 tablet by mouth once daily 90 tablet 0   SYMBICORT 80-4.5 MCG/ACT inhaler INHALE 2 PUFFS BY MOUTH IN THE MORNING AND AT BEDTIME 10.2 g 5   Current Facility-Administered Medications  Medication Dose Route Frequency Provider Last Rate Last  Admin   Benralizumab SOSY 30 mg  30 mg Subcutaneous Q8 Weeks Marcelyn Bruins, MD   30 mg at 08/22/22 1420    SURGICAL HISTORY:  Past Surgical History:  Procedure Laterality Date   ABDOMINAL HYSTERECTOMY     BOWEL RESECTION N/A 05/23/2020   Procedure: SMALL BOWEL REPAIR;  Surgeon: Sheliah Hatch, De Blanch, MD;  Location: [facility] OR;  Service: General;  Laterality: N/A;   BREAST BIOPSY     BRONCHIAL BIOPSY  06/10/2020   Procedure: BRONCHIAL BIOPSIES;  Surgeon: [name], DO;  Location: [facility] ENDOSCOPY;  Service: Pulmonary;;   BRONCHIAL BRUSHINGS  06/10/2020   Procedure: BRONCHIAL BRUSHINGS;  Surgeon: [name], DO;  Location: [facility];  Service: Pulmonary;;   BRONCHIAL NEEDLE ASPIRATION BIOPSY  06/10/2020   Procedure: BRONCHIAL NEEDLE ASPIRATION BIOPSIES;  Surgeon: [name], DO;  Location: [facility];  Service: Pulmonary;;   BRONCHIAL WASHINGS  06/10/2020   Procedure: BRONCHIAL WASHINGS;  Surgeon: [name], DO;  Location: [facility];  Service: Pulmonary;;   COLON SURGERY     DIAGNOSTIC LARYNGOSCOPY N/A 05/23/2020   Procedure: ATTEMPTED DIAGNOSTIC LAPAROSCOPY WITH ADHESIONS;  Surgeon: Sheliah Hatch, De Blanch, MD;  Location: [facility] OR;   Service: General;  Laterality: N/A;   IR IMAGING GUIDED PORT INSERTION  07/15/2020   KNEE SURGERY     LAPAROTOMY N/A 04/18/2015   Procedure: Exploratory laparotomy with lysis of adhesions, possible bowel resection;  Surgeon: Axel Filler, MD;  Location: [facility] OR;  Service: General;  Laterality: N/A;   LAPAROTOMY N/A 05/23/2020   Procedure: EXPLORATORY LAPAROTOMY;  Surgeon: Rodman Pickle, MD;  Location: [facility] OR;  Service: General;  Laterality: N/A;   LYSIS OF ADHESION N/A 05/23/2020   Procedure: LYSIS OF ADHESION;  Surgeon: Sheliah Hatch De Blanch, MD;  Location: [facility] OR;  Service: General;  Laterality: N/A;   NASAL SINUS SURGERY     THYROID SURGERY     VIDEO BRONCHOSCOPY WITH ENDOBRONCHIAL NAVIGATION Bilateral 06/10/2020   Procedure: VIDEO BRONCHOSCOPY WITH ENDOBRONCHIAL NAVIGATION;  Surgeon: [name], DO;  Location: [facility];  Service: Pulmonary;  Laterality: Bilateral;   VIDEO BRONCHOSCOPY WITH ENDOBRONCHIAL ULTRASOUND  06/10/2020   Procedure: VIDEO BRONCHOSCOPY WITH ENDOBRONCHIAL ULTRASOUND;  Surgeon: [name], DO;  Location: [facility];  Service: Pulmonary;;    REVIEW OF SYSTEMS:   Review of Systems  Constitutional: Negative for appetite change, chills, fatigue, fever and unexpected weight change.  HENT:   Negative for mouth sores, nosebleeds, sore throat and trouble swallowing.   Eyes: Negative for eye problems and icterus.  Respiratory: Negative for cough, hemoptysis, shortness of breath and wheezing.   Cardiovascular: Negative for chest pain and leg swelling.  Gastrointestinal: Negative for abdominal pain, constipation, diarrhea, nausea and vomiting.  Genitourinary: Negative for bladder incontinence, difficulty urinating, dysuria, frequency and hematuria.   Musculoskeletal: Negative for back pain, gait problem, neck pain and neck stiffness.  Skin: Negative for itching and rash.  Neurological: Negative for dizziness, extremity weakness, gait problem, headaches,  light-headedness and seizures.  Hematological: Negative for adenopathy. Does not bruise/bleed easily.  Psychiatric/Behavioral: Negative for confusion, depression and sleep disturbance. The patient is not nervous/anxious.     PHYSICAL EXAMINATION:  There were no vitals taken for this visit.  ECOG PERFORMANCE STATUS: {CHL ONC ECOG [id number]  Physical Exam  Constitutional: Oriented to person, place, and time and well-developed, well-nourished, and in no distress. No distress.  HENT:  Head: Normocephalic and atraumatic.  Mouth/Throat: Oropharynx is clear and moist. No oropharyngeal  exudate.  Eyes: Conjunctivae are normal. Right eye exhibits no discharge. Left eye exhibits no discharge. No scleral icterus.  Neck: Normal range of motion. Neck supple.  Cardiovascular: Normal rate, regular rhythm, normal heart sounds and intact distal pulses.   Pulmonary/Chest: Effort normal and breath sounds normal. No respiratory distress. No wheezes. No rales.  Abdominal: Soft. Bowel sounds are normal. Exhibits no distension and no mass. There is no tenderness.  Musculoskeletal: Normal range of motion. Exhibits no edema.  Lymphadenopathy:    No cervical adenopathy.  Neurological: Alert and oriented to person, place, and time. Exhibits normal muscle tone. Gait normal. Coordination normal.  Skin: Skin is warm and dry. No rash noted. Not diaphoretic. No erythema. No pallor.  Psychiatric: Mood, memory and judgment normal.  Vitals reviewed.  LABORATORY DATA: Lab Results  Component Value Date   WBC 8.8 10/09/2022   HGB 10.5 (L) 10/09/2022   HCT 31.7 (L) 10/09/2022   MCV 84.3 10/09/2022   PLT 193 10/09/2022      Chemistry      Component Value Date/Time   NA 134 (L) 10/09/2022 0046   K 3.5 10/09/2022 0046   CL 99 10/09/2022 0046   CO2 25 10/09/2022 0046   BUN 11 10/09/2022 0046   CREATININE 0.80 10/09/2022 0046   CREATININE 0.65 07/09/2022 0837      Component Value Date/Time   CALCIUM 8.6  (L) 10/09/2022 0046   ALKPHOS 66 10/07/2022 1037   AST 17 10/07/2022 1037   AST 13 (L) 07/09/2022 0837   ALT 12 10/07/2022 1037   ALT 7 07/09/2022 0837   BILITOT 0.5 10/07/2022 1037   BILITOT 0.3 07/09/2022 0837       RADIOGRAPHIC STUDIES:  DG Chest 2 View  Result Date: 10/09/2022 CLINICAL DATA:  Pleural effusion EXAM: CHEST - 2 VIEW COMPARISON:  10/07/2022 FINDINGS: Small left pleural effusion with left base atelectasis, similar prior study. No right effusion. No pneumothorax. Heart and mediastinal contours within normal limits. No acute bony abnormality. Right Port-A-Cath is unchanged. IMPRESSION: Small left pleural effusion with left base atelectasis, stable. Electronically Signed   By: Charlett Nose M.D.   On: 10/09/2022 09:02   ECHOCARDIOGRAM COMPLETE  Result Date: 10/08/2022    ECHOCARDIOGRAM REPORT   Patient Name:   Angelica [name] Date of Exam: 10/08/2022 Medical Rec #:  [id number]        Height:       62.0 in Accession #:    [id number]       Weight:       147.0 lb Date of Birth:  July 16, 1940         BSA:          1.677 m Patient Age:    81 years         BP:           121/69 mmHg Patient Gender: F                HR:           83 bpm. Exam Location:  Inpatient Procedure: 2D Echo, Cardiac Doppler and Color Doppler Indications:    Abnormal ECG  History:        Patient has no prior history of Echocardiogram examinations.                 Signs/Symptoms:Edema; Risk Factors:Hypertension.  Sonographer:    Lucy Antigua Referring Phys: [id number] Jon Billings A REGALADO  Sonographer Comments: Small, right-sided pericardial effusion. Seen in  subcostals. Pt Asymptomatic. IMPRESSIONS  1. Left ventricular ejection fraction, by estimation, is 55 to 60%. The left ventricle has normal function. The left ventricle has no regional wall motion abnormalities. There is mild concentric left ventricular hypertrophy. Left ventricular diastolic parameters are indeterminate.  2. Right ventricular systolic function is normal. The  right ventricular size is normal. Tricuspid regurgitation signal is inadequate for assessing PA pressure.  3. A small pericardial effusion is present. There is no evidence of cardiac tamponade.  4. The mitral valve is grossly normal. No evidence of mitral valve regurgitation. No evidence of mitral stenosis.  5. The aortic valve was not well visualized. Aortic valve regurgitation is not visualized. No aortic stenosis is present.  6. The inferior vena cava is normal in size with greater than 50% respiratory variability, suggesting right atrial pressure of 3 mmHg. Comparison(s): No prior Echocardiogram. FINDINGS  Left Ventricle: Left ventricular ejection fraction, by estimation, is 55 to 60%. The left ventricle has normal function. The left ventricle has no regional wall motion abnormalities. The left ventricular internal cavity size was normal in size. There is  mild concentric left ventricular hypertrophy. Left ventricular diastolic parameters are indeterminate. Right Ventricle: The right ventricular size is normal. Right vetricular wall thickness was not well visualized. Right ventricular systolic function is normal. Tricuspid regurgitation signal is inadequate for assessing PA pressure. Left Atrium: Left atrial size was normal in size. Right Atrium: Right atrial size was normal in size. Pericardium: A small pericardial effusion is present. There is no evidence of cardiac tamponade. Mitral Valve: The mitral valve is grossly normal. No evidence of mitral valve regurgitation. No evidence of mitral valve stenosis. Tricuspid Valve: The tricuspid valve is not well visualized. Tricuspid valve regurgitation is not demonstrated. No evidence of tricuspid stenosis. Aortic Valve: The aortic valve was not well visualized. Aortic valve regurgitation is not visualized. No aortic stenosis is present. Aortic valve mean gradient measures 3.0 mmHg. Aortic valve peak gradient measures 7.4 mmHg. Aortic valve area, by VTI measures 1.92  cm. Pulmonic Valve: The pulmonic valve was not well visualized. Pulmonic valve regurgitation is not visualized. No evidence of pulmonic stenosis. Aorta: The aortic root, ascending aorta, aortic arch and descending aorta are all structurally normal, with no evidence of dilitation or obstruction. Venous: The inferior vena cava is normal in size with greater than 50% respiratory variability, suggesting right atrial pressure of 3 mmHg. IAS/Shunts: The atrial septum is grossly normal.  LEFT VENTRICLE PLAX 2D LVIDd:         1.80 cm     Diastology LVIDs:         1.40 cm     LV e' medial:    4.24 cm/s LV PW:         1.20 cm     LV E/e' medial:  15.4 LV IVS:        1.00 cm     LV e' lateral:   4.13 cm/s LVOT diam:     1.80 cm     LV E/e' lateral: 15.8 LV SV:         43 LV SV Index:   26 LVOT Area:     2.54 cm  LV Volumes (MOD) LV vol d, MOD A4C: 27.9 ml LV vol s, MOD A4C: 14.9 ml LV SV MOD A4C:     27.9 ml RIGHT VENTRICLE RV S prime:     14.90 cm/s TAPSE (M-mode): 2.4 cm LEFT ATRIUM  Index        RIGHT ATRIUM          Index LA Vol (A2C):   17.5 ml 10.43 ml/m  RA Area:     5.94 cm LA Vol (A4C):   11.8 ml 7.04 ml/m   RA Volume:   9.45 ml  5.63 ml/m LA Biplane Vol: 14.7 ml 8.76 ml/m  AORTIC VALVE AV Area (Vmax):    1.70 cm AV Area (Vmean):   1.84 cm AV Area (VTI):     1.92 cm AV Vmax:           136.00 cm/s AV Vmean:          84.100 cm/s AV VTI:            0.224 m AV Peak Grad:      7.4 mmHg AV Mean Grad:      3.0 mmHg LVOT Vmax:         90.70 cm/s LVOT Vmean:        60.900 cm/s LVOT VTI:          0.169 m LVOT/AV VTI ratio: 0.75  AORTA Ao Root diam: 3.20 cm MITRAL VALVE MV Area (PHT): 4.06 cm    SHUNTS MV Decel Time: 187 msec    Systemic VTI:  0.17 m MV E velocity: 65.10 cm/s  Systemic Diam: 1.80 cm MV A velocity: 87.40 cm/s MV E/A ratio:  0.74 Jodelle Red MD Electronically signed by Jodelle Red MD Signature Date/Time: 10/08/2022/7:04:47 PM    Final    US THORACENTESIS ASP PLEURAL SPACE  W/IMG GUIDE  Result Date: 10/07/2022 INDICATION: Patient with history of lung adenocarcinoma, dyspnea, bilateral pleural effusions, right greater than left ; request received for diagnostic and therapeutic thoracentesis EXAM: ULTRASOUND GUIDED DIAGNOSTIC AND THERAPEUTIC RIGHT THORACENTESIS MEDICATIONS: 8 mL 1% lidocaine COMPLICATIONS: None immediate. PROCEDURE: An ultrasound guided thoracentesis was thoroughly discussed with the patient/daughter and questions answered. The benefits, risks, alternatives and complications were also discussed. The patient/daughter understands and wishes to proceed with the procedure. Written consent was obtained. Ultrasound was performed to localize and mark an adequate pocket of fluid in the right chest. The area was then prepped and draped in the normal sterile fashion. 1% Lidocaine was used for local anesthesia. Under ultrasound guidance a 6 Fr Safe-T-Centesis catheter was introduced. Thoracentesis was performed. The catheter was removed and a dressing applied. FINDINGS: A total of approximately 450 cc of yellow fluid was removed. Samples were sent to the laboratory as requested by the clinical team. IMPRESSION: Successful ultrasound guided diagnostic and therapeutic right thoracentesis yielding 450 cc of pleural fluid. Performed by: Artemio Aly Electronically Signed   By: Marliss Coots M.D.   On: 10/07/2022 14:16   DG Chest 1 View  Result Date: 10/07/2022 CLINICAL DATA:  82 year old female status post ultrasound-guided right side thoracentesis this morning. EXAM: CHEST  1 VIEW COMPARISON:  Ultrasound images 0817 hours today. Chest radiographs yesterday. FINDINGS: Portable AP upright view at 0958 hours. No pneumothorax. Allowing for portable technique the right lung now is largely negative. Stable left veiling pleural effusion at the lung base. Stable cardiac size and mediastinal contours. Stable right chest power port. Sequelae of thyroidectomy. Negative visible bowel  gas. No acute osseous abnormality identified. IMPRESSION: 1. No pneumothorax following right thoracentesis. 2. Stable left pleural effusion. Electronically Signed   By: Odessa Fleming M.D.   On: 10/07/2022 10:08   DG Chest 2 View  Result Date: 10/06/2022 CLINICAL DATA:  Pleural effusion. Acute  respiratory failure with hypoxia. Stage III adenocarcinoma of the lung. EXAM: CHEST - 2 VIEW COMPARISON:  10/02/2022 FINDINGS: RIGHT-sided PowerPort tip overlies the superior vena cava. Heart size is normal. There is increased opacity in the LEFT lung base, consisting of pleural effusion and atelectasis or consolidation. The RIGHT lung remains clear. No pulmonary edema. IMPRESSION: Increased opacity in the LEFT lung base, consisting of pleural effusion and atelectasis or consolidation. Electronically Signed   By: Norva Pavlov M.D.   On: 10/06/2022 08:40   DG Abd 1 View  Result Date: 10/06/2022 CLINICAL DATA:  82 year old female with generalized stomach pain. EXAM: ABDOMEN - 1 VIEW COMPARISON:  CTA chest 10/05/2022. CT Abdomen and Pelvis 10/19/2021. FINDINGS: AP view at 0531 hours. Bilateral pleural effusions better demonstrated by CTA yesterday. Excreted IV contrast in the urinary bladder. Non obstructed bowel gas pattern. Degenerative changes in the spine. No acute osseous abnormality identified. IMPRESSION: Non obstructed bowel gas pattern. Electronically Signed   By: Odessa Fleming M.D.   On: 10/06/2022 06:00   UE Venous Duplex ([facility] and [facility] ONLY)  Result Date: 10/05/2022 UPPER VENOUS STUDY  Patient Name:  TAWN BUROKER  Date of Exam:   10/05/2022 Medical Rec #: [id number]         Accession #:    [id number] Date of Birth: 1940/07/26          Patient Gender: F Patient Age:   83 years Exam Location:  [facility] Procedure:      VAS [place] UPPER EXTREMITY VENOUS DUPLEX Referring Phys: Lynden Oxford --------------------------------------------------------------------------------  Indications: Left arm, face, and hand  swelling Risk Factors: HX of Lung CA. Comparison Study: No previosu exams Performing Technologist: Jody Hill RVT, RDMS  Examination Guidelines: A complete evaluation includes B-mode imaging, spectral Doppler, color Doppler, and power Doppler as needed of all accessible portions of each vessel. Bilateral testing is considered an integral part of a complete examination. Limited examinations for reoccurring indications may be performed as noted.  Right Findings: +----------+------------+---------+-----------+----------+-------+ RIGHT     CompressiblePhasicitySpontaneousPropertiesSummary +----------+------------+---------+-----------+----------+-------+ Subclavian               Yes       Yes                      +----------+------------+---------+-----------+----------+-------+ Limited visualization of subclavian due to port placement patent by color and doppler  Left Findings: +----------+------------+---------+-----------+----------+-------+ LEFT      CompressiblePhasicitySpontaneousPropertiesSummary +----------+------------+---------+-----------+----------+-------+ IJV           Full       Yes       Yes                      +----------+------------+---------+-----------+----------+-------+ Subclavian    Full       Yes       Yes                      +----------+------------+---------+-----------+----------+-------+ Axillary      Full       Yes       Yes                      +----------+------------+---------+-----------+----------+-------+ Brachial      Full       Yes       Yes                      +----------+------------+---------+-----------+----------+-------+ Radial  Full                                          +----------+------------+---------+-----------+----------+-------+ Ulnar         Full                                          +----------+------------+---------+-----------+----------+-------+ Cephalic      Full                                           +----------+------------+---------+-----------+----------+-------+ Basilic       Full       Yes       Yes                      +----------+------------+---------+-----------+----------+-------+  Summary:  Right: No evidence of thrombosis in the subclavian.  Left: No evidence of deep vein thrombosis in the upper extremity. No evidence of superficial vein thrombosis in the upper extremity.  *See table(s) above for measurements and observations.  Diagnosing physician: Gerarda Fraction Electronically signed by Gerarda Fraction on 10/05/2022 at 2:56:59 PM.    Final    CT Soft Tissue Neck W Contrast  Result Date: 10/05/2022 CLINICAL DATA:  Neck mass, nonpulsatile concern for left neck and face swelling EXAM: CT NECK WITH CONTRAST TECHNIQUE: Multidetector CT imaging of the neck was performed using the standard protocol following the bolus administration of intravenous contrast. RADIATION DOSE REDUCTION: This exam was performed according to the departmental dose-optimization program which includes automated exposure control, adjustment of the mA and/or kV according to patient size and/or use of iterative reconstruction technique. CONTRAST:  OMNIPAQUE IOHEXOL 350 MG/ML SOLN COMPARISON:  None Available. FINDINGS: Limitations: Assessment is slightly limited due to motion artifact of the level of the oral and hypopharynx. Pharynx and larynx: The epiglottis is normal in appearance. Bilateral tonsils are normal in appearance. No evidence of parapharyngeal space soft tissue stranding. Salivary glands: No inflammation, mass, or stone. Thyroid: Heterogeneous and multinodular left thyroid lobe, not significantly changed compared to prior CT chest dated 04/11/2019. Lymph nodes: Within the limitations of motion artifact, no definite cervical lymphadenopathy is visualized. Vascular: Negative. Limited intracranial: Incompletely imaged Visualized orbits: Not imaged Mastoids and visualized paranasal  sinuses: No middle ear or mastoid effusion. There is mucosal thickening in the bilateral maxillary sinuses. Skeleton: Straightening of the normal cervical lordosis. No suspicious osseous lesions. Upper chest: Small bilateral pleural effusions, new from prior exam. Right chest port in place with tip visualized to the level of the mid to lower SVC. See separately dictated chest for findings below the thoracic inlet. Other: There is diffuse anasarca there may be slightly more asymmetric in the left neck compared to the right. No definite underlying lesion is visualized. IMPRESSION: 1. Diffuse anasarca, which may be slightly more asymmetric in the left neck compared to the right. No definite underlying lesion is visualized. 2. Small bilateral pleural effusions, new from prior exam. See separately dictated chest for findings below the thoracic inlet. Electronically Signed   By: Lorenza Cambridge M.D.   On: 10/05/2022 14:27   CT Angio Chest PE W and/or Wo Contrast  Result Date: 10/05/2022  CLINICAL DATA:  Left lower neck swelling. Cancer history. Query pulmonary embolus or SVC syndrome. EXAM: CT ANGIOGRAPHY CHEST WITH CONTRAST TECHNIQUE: Multidetector CT imaging of the chest was performed using the standard protocol during bolus administration of intravenous contrast. Multiplanar CT image reconstructions and MIPs were obtained to evaluate the vascular anatomy. RADIATION DOSE REDUCTION: This exam was performed according to the departmental dose-optimization program which includes automated exposure control, adjustment of the mA and/or kV according to patient size and/or use of iterative reconstruction technique. CONTRAST:  OMNIPAQUE IOHEXOL 350 MG/ML SOLN COMPARISON:  07/10/2022 FINDINGS: Cardiovascular: No filling defect is identified in the pulmonary arterial tree to suggest pulmonary embolus. Coronary, aortic arch, and branch vessel atherosclerotic vascular disease. No thoracic aortic aneurysm or dissection is  identified. Right Port-A-Cath tip: SVC. Because today's exam is timed for pulmonary arterial opacification, the systemic veins are not opacified and accordingly assessment of patency is limited. Mediastinum/Nodes: No obvious mass lesion impinging on the SVC, brachiocephalic vein, or left axillary vein. There is diffuse low-grade infiltrative edema in the mediastinum and also in the subcutaneous tissues bilaterally. Lungs/Pleura: Moderate to large bilateral pleural effusions with associated passive atelectasis. Upper Abdomen: Unremarkable Musculoskeletal: Thoracic spondylosis. Review of the MIP images confirms the above findings. IMPRESSION: 1. No filling defect is identified in the pulmonary arterial tree to suggest pulmonary embolus. 2. Moderate to large bilateral pleural effusions with associated passive atelectasis. 3. Diffuse low-grade infiltrative edema in the mediastinum and also in the subcutaneous tissues bilaterally, suggesting third spacing of fluid. 4. No obvious mass lesion impinging on the SVC, brachiocephalic vein, or left axillary vein. Please note that systemic venous structures are not opacified on today's exam, which was timed for pulmonary arterial opacification. Electronically Signed   By: Gaylyn Rong M.D.   On: 10/05/2022 14:08   DG Chest Port 1 View  Result Date: 10/02/2022 CLINICAL DATA:  Fatigue. Nausea and weakness starting yesterday. History of cancer, asthma, hypertension, and smoking. EXAM: PORTABLE CHEST 1 VIEW COMPARISON:  07/10/2022 FINDINGS: Heart size and pulmonary vascularity are normal. Power port type central venous catheter with tip over the mid SVC region. No pneumothorax. Surgical clips in the base of the neck. Lungs are clear and expanded. No pleural effusions. No pneumothorax. Mediastinal contours appear intact. Calcification of the aorta. Degenerative changes in the spine and shoulders. IMPRESSION: No active disease. Electronically Signed   By: Burman Nieves  M.D.   On: 10/02/2022 09:55     ASSESSMENT/PLAN:  This is a very pleasant 82 year old African-American female diagnosed with stage IIIb (T2a, N3, M0) small cell lung cancer, adenocarcinoma.  She presented with a left lower lobe lung mass in addition to left hilar mediastinal lymphadenopathy.  She also had right and left cervical lymphadenopathy in January 2022.  She underwent a course of concurrent chemoradiation with weekly carboplatin for an AUC 2 and paclitaxel 45 mg/m.  She status post 5 cycles with a partial response.  She then underwent 3 cycles of consolidation immunotherapy with Imfinzi 1500 mg every 4 weeks.  This was discontinued secondary to hospitalization with SIRS secondary to bacteremia secondary to Klebsiella pneumoniae.  She has been on observation since 2022 and feeling well.   The patient was recently hospitalized for anasarca, hypoxia, and pleural effusion.  She underwent a thoracentesis and the final cytology did not show any malignant cells.  Her CT scan also did not show any disease progression.  Dr. Arbutus Ped recommends that the patient continue on observation.  We will arrange  for restaging CT scan of the chest in***months.  She will continue to follow with her PCP regarding her anasarca for which she is currently on Lasix as needed.  The patient was advised to call immediately if she has any concerning symptoms in the interval. The patient voices understanding of current disease status and treatment options and is in agreement with the current care plan. All questions were answered. The patient knows to call the clinic with any problems, questions or concerns. We can certainly see the patient much sooner if necessary     No orders of the defined types were placed in this encounter.    I spent {CHL ONC TIME VISIT - ZOXWR:[id number]} counseling the patient face to face. The total time spent in the appointment was {CHL ONC TIME VISIT -  BJYNW:[id number]}.  Cal Gindlesperger L Jamiaya Bina, PA-C 10/10/22

## 2022-10-10 NOTE — Telephone Encounter (Signed)
Contacted patient to scheduled appointments. Patient is aware of appointments that are scheduled.   

## 2022-10-10 NOTE — Chronic Care Management (AMB) (Signed)
   10/10/2022  Angelica [name] Feb 06, 1941 [id number]   Reason for Encounter: Patient is not currently enrolled in the CCM program. CCM status changed to previously enrolled  Alto Denver RN, MSN, CCM RN Care Manager  Chronic Care Management Direct Number: [phone]

## 2022-10-11 ENCOUNTER — Inpatient Hospital Stay: Payer: Medicare HMO

## 2022-10-11 ENCOUNTER — Other Ambulatory Visit: Payer: Self-pay

## 2022-10-11 ENCOUNTER — Other Ambulatory Visit: Payer: Self-pay | Admitting: Physician Assistant

## 2022-10-11 ENCOUNTER — Telehealth: Payer: Self-pay | Admitting: Internal Medicine

## 2022-10-11 ENCOUNTER — Inpatient Hospital Stay: Payer: Medicare HMO | Attending: Internal Medicine | Admitting: Physician Assistant

## 2022-10-11 VITALS — BP 138/81 | HR 100 | Temp 97.4°F | Resp 17 | Ht 62.0 in | Wt 135.2 lb

## 2022-10-11 DIAGNOSIS — D509 Iron deficiency anemia, unspecified: Secondary | ICD-10-CM

## 2022-10-11 DIAGNOSIS — Z90710 Acquired absence of both cervix and uterus: Secondary | ICD-10-CM | POA: Diagnosis not present

## 2022-10-11 DIAGNOSIS — I10 Essential (primary) hypertension: Secondary | ICD-10-CM | POA: Diagnosis not present

## 2022-10-11 DIAGNOSIS — C3432 Malignant neoplasm of lower lobe, left bronchus or lung: Secondary | ICD-10-CM | POA: Diagnosis not present

## 2022-10-11 DIAGNOSIS — C3492 Malignant neoplasm of unspecified part of left bronchus or lung: Secondary | ICD-10-CM | POA: Diagnosis not present

## 2022-10-11 DIAGNOSIS — J90 Pleural effusion, not elsewhere classified: Secondary | ICD-10-CM | POA: Insufficient documentation

## 2022-10-11 DIAGNOSIS — D649 Anemia, unspecified: Secondary | ICD-10-CM | POA: Insufficient documentation

## 2022-10-11 DIAGNOSIS — E039 Hypothyroidism, unspecified: Secondary | ICD-10-CM | POA: Diagnosis not present

## 2022-10-11 DIAGNOSIS — J9601 Acute respiratory failure with hypoxia: Secondary | ICD-10-CM | POA: Diagnosis not present

## 2022-10-11 DIAGNOSIS — K219 Gastro-esophageal reflux disease without esophagitis: Secondary | ICD-10-CM | POA: Diagnosis not present

## 2022-10-11 DIAGNOSIS — F0280 Dementia in other diseases classified elsewhere without behavioral disturbance: Secondary | ICD-10-CM | POA: Diagnosis not present

## 2022-10-11 DIAGNOSIS — E785 Hyperlipidemia, unspecified: Secondary | ICD-10-CM | POA: Diagnosis not present

## 2022-10-11 DIAGNOSIS — J45909 Unspecified asthma, uncomplicated: Secondary | ICD-10-CM | POA: Diagnosis not present

## 2022-10-11 LAB — CULTURE, BLOOD (ROUTINE X 2): Culture: NO GROWTH

## 2022-10-11 MED ORDER — INTEGRA PLUS PO CAPS
1.0000 | ORAL_CAPSULE | Freq: Every morning | ORAL | 2 refills | Status: DC
Start: 2022-10-11 — End: 2022-11-29

## 2022-10-11 NOTE — Telephone Encounter (Signed)
Ok for verbals 

## 2022-10-11 NOTE — Telephone Encounter (Signed)
Caller & What Company: Angelica [name] from [facility]   Phone Number:[phone]   Needs Verbal orders for what service & frequency:  home health physical therapy starting 5/23 - once a week for 3 weeks, then once every other week for 6 weeks.  Also requesting a speech evaluation for impaired memory.

## 2022-10-11 NOTE — Telephone Encounter (Signed)
Called and gave verbal okay.  

## 2022-10-13 ENCOUNTER — Encounter (HOSPITAL_COMMUNITY): Payer: Self-pay

## 2022-10-13 ENCOUNTER — Other Ambulatory Visit: Payer: Self-pay

## 2022-10-13 ENCOUNTER — Emergency Department (HOSPITAL_COMMUNITY): Payer: Medicare HMO

## 2022-10-13 ENCOUNTER — Emergency Department (HOSPITAL_COMMUNITY)
Admission: EM | Admit: 2022-10-13 | Discharge: 2022-10-13 | Disposition: A | Discharge status: Home / Self Care | Payer: Medicare HMO | Attending: Student | Admitting: Student

## 2022-10-13 DIAGNOSIS — Z79899 Other long term (current) drug therapy: Secondary | ICD-10-CM | POA: Insufficient documentation

## 2022-10-13 DIAGNOSIS — Z85118 Personal history of other malignant neoplasm of bronchus and lung: Secondary | ICD-10-CM | POA: Diagnosis not present

## 2022-10-13 DIAGNOSIS — R0989 Other specified symptoms and signs involving the circulatory and respiratory systems: Secondary | ICD-10-CM | POA: Diagnosis not present

## 2022-10-13 DIAGNOSIS — Z91010 Allergy to peanuts: Secondary | ICD-10-CM | POA: Insufficient documentation

## 2022-10-13 DIAGNOSIS — Z7902 Long term (current) use of antithrombotics/antiplatelets: Secondary | ICD-10-CM | POA: Insufficient documentation

## 2022-10-13 DIAGNOSIS — I10 Essential (primary) hypertension: Secondary | ICD-10-CM | POA: Insufficient documentation

## 2022-10-13 DIAGNOSIS — N644 Mastodynia: Secondary | ICD-10-CM | POA: Diagnosis not present

## 2022-10-13 DIAGNOSIS — J90 Pleural effusion, not elsewhere classified: Secondary | ICD-10-CM | POA: Diagnosis not present

## 2022-10-13 MED ORDER — DOXYCYCLINE HYCLATE 100 MG PO CAPS: 100.0000 mg | ORAL_CAPSULE | Freq: Two times a day (BID) | ORAL | 0 refills | Status: AC

## 2022-10-13 NOTE — ED Triage Notes (Signed)
Pt. Arrives POV c/o L breast swelling that was noticed today. Pt. C/o soreness to the L.breast. Pt. Has a hx of lung cancer.

## 2022-10-13 NOTE — ED Provider Notes (Signed)
[place] EMERGENCY DEPARTMENT AT [facility] Provider Note   CSN: [id number] Arrival date & time: 10/13/22  1945     History  No chief complaint on file.   Angelica [name] is a 82 y.o. female.  HPI   Patient with medical history including non-small cell lung cancer left lower lobe currently in remission, hypertension, GERD, history of pleural effusions, presenting with complaints of left breast swelling and pain.  Patient states that started today, states that her left breast feels slightly heavier and is tender, she denies any drainage, no history of breast cancer, she does denies any trauma to the area she is not actively breast-feeding.  She denies any chest pain shortness of breath, no cough or congestion no fevers or chills, she does state that she has some left shoulder pain but this is chronic for her and unchanged.  Reviewed patient's chart was recently admitted for pleural effusion as well as pneumonia, underwent pleurocentesis, diagnostics have the fluid was unremarkable, suspect this is likely anasarca, was placed on Lasix, she is on antibiotics just recently finished azithromycin.    Home Medications Prior to Admission medications   Medication Sig Start Date End Date Taking? Authorizing Provider  doxycycline (VIBRAMYCIN) 100 MG capsule Take 1 capsule (100 mg total) by mouth 2 (two) times daily for 7 days. 10/13/22 10/20/22 Yes Carroll Sage, PA-C  acetaminophen (TYLENOL) 500 MG tablet Take 500 mg by mouth every 6 (six) hours as needed for moderate pain.    [provider]  albuterol (VENTOLIN HFA) 108 (90 Base) MCG/ACT inhaler Inhale 2 puffs into the lungs every 4 (four) hours as needed for wheezing or shortness of breath. 10/04/22   Marcelyn Bruins, MD  alum & mag hydroxide-simeth (MAALOX/MYLANTA) 200-200-20 MG/5ML suspension Take 15 mLs by mouth every 6 (six) hours as needed for indigestion or heartburn.    [provider]   carboxymethylcellulose (REFRESH PLUS) 0.5 % SOLN Place 1 drop into both eyes daily as needed (Uses as needed).    [provider]  cefdinir (OMNICEF) 300 MG capsule Take 1 capsule (300 mg total) by mouth 2 (two) times daily for 5 days. 10/09/22 10/14/22  Regalado, Jon Billings A, MD  clopidogrel (PLAVIX) 75 MG tablet Take 1 tablet by mouth once daily 07/05/22   [name], MD  cyanocobalamin 1000 MCG tablet Take 1 tablet (1,000 mcg total) by mouth daily for 15 days. 10/09/22 10/24/22  Regalado, Jon Billings A, MD  diclofenac Sodium (VOLTAREN) 1 % GEL Apply 4 g topically 4 (four) times daily. 04/23/22   Rodolph Bong, MD  EPINEPHrine (EPIPEN 2-PAK) 0.3 mg/0.3 mL IJ SOAJ injection Use as directed for severe allergic reaction 09/24/22   Gwenlyn Fudge, FNP  EQ ALLERGY RELIEF, CETIRIZINE, 10 MG tablet Take 1 tablet by mouth once daily 09/24/22   Marcelyn Bruins, MD  FeFum-FePoly-FA-B Cmp-C-Biot (INTEGRA PLUS) CAPS Take 1 capsule by mouth every morning. 10/11/22   Heilingoetter, Cassandra L, PA-C  fluticasone (FLONASE) 50 MCG/ACT nasal spray Place 1 spray into both nostrils in the morning and at bedtime. 01/05/22   Marcelyn Bruins, MD  folic acid (FOLVITE) 1 MG tablet Take 1 tablet (1 mg total) by mouth daily. 10/10/22   Regalado, Belkys A, MD  furosemide (LASIX) 20 MG tablet Take 1 tablet (20 mg total) by mouth daily as needed. For increase swelling 10/09/22   Regalado, Belkys A, MD  ipratropium (ATROVENT) 0.06 % nasal spray Place 2 sprays into  both nostrils 4 (four) times daily as needed (nasal drainage). 10/04/22   Marcelyn Bruins, MD  levothyroxine (SYNTHROID) 50 MCG tablet Take 1 tablet (50 mcg total) by mouth daily before breakfast. 10/10/22   Regalado, Belkys A, MD  Magnesium Hydroxide (PHILLIPS MILK OF MAGNESIA PO) Take 15-30 mLs by mouth daily as needed (for constipation).    [provider]  memantine (NAMENDA) 10 MG tablet Take 1 tablet (10 mg total) by mouth 2 (two)  times daily. 04/17/22   Micki Riley, MD  montelukast (SINGULAIR) 10 MG tablet Take 1 tablet (10 mg total) by mouth at bedtime. 10/04/22   Marcelyn Bruins, MD  ondansetron (ZOFRAN-ODT) 4 MG disintegrating tablet Take 1 tablet (4 mg total) by mouth every 8 (eight) hours as needed for nausea or vomiting. 07/09/22   Gwenlyn Fudge, FNP  pantoprazole (PROTONIX) 40 MG tablet Take 1 tablet (40 mg total) by mouth daily. 09/07/22   [name], MD  polyethylene glycol (MIRALAX / GLYCOLAX) 17 g packet Take 17 g by mouth daily as needed for mild constipation.    [provider]  potassium chloride SA (KLOR-CON M) 20 MEQ tablet Take 2 tablets (40 mEq total) by mouth daily as needed. Take potasium days you take lasix. 10/09/22   Regalado, Jon Billings A, MD  pravastatin (PRAVACHOL) 80 MG tablet Take 1 tablet by mouth once daily 07/23/22   [name], MD  SYMBICORT 80-4.5 MCG/ACT inhaler INHALE 2 PUFFS BY MOUTH IN THE MORNING AND AT BEDTIME 10/04/22   Marcelyn Bruins, MD      Allergies    Asa buff (mag [buffered aspirin], Ensure [nutritional supplements], Fish allergy, Other, Peanut-containing drug products, Penicillins, Shellfish-derived products, and Strawberry extract    Review of Systems   Review of Systems  Constitutional:  Negative for chills and fever.  Respiratory:  Negative for shortness of breath.   Cardiovascular:  Negative for chest pain.  Gastrointestinal:  Negative for abdominal pain.  Musculoskeletal:        Left breast pain and swelling  Neurological:  Negative for headaches.    Physical Exam Updated Vital Signs BP (!) 140/78 (BP Location: Right Arm)   Pulse 82   Temp 99.1 F (37.3 C) (Oral)   Resp 16   Ht 5\' 2"  (1.575 m)   Wt 61.2 kg   SpO2 100%   BMI 24.69 kg/m  Physical Exam Vitals and nursing note reviewed. Exam conducted with a chaperone present.  Constitutional:      General: She is not in acute distress.    Appearance: She is not  ill-appearing.  HENT:     Head: Normocephalic and atraumatic.     Nose: No congestion.  Eyes:     Conjunctiva/sclera: Conjunctivae normal.  Cardiovascular:     Rate and Rhythm: Normal rate and regular rhythm.     Pulses: Normal pulses.     Heart sounds: No murmur heard.    No friction rub. No gallop.  Pulmonary:     Effort: No respiratory distress.     Breath sounds: No wheezing, rhonchi or rales.     Comments: Minimal Rales heard in the lower lobes bilaterally worse in the left versus the right Musculoskeletal:     Comments: No noted unilateral swelling of the upper extremities, 2+ radial pulses, sensation intact to light touch, full range of motion.  Skin:    General: Skin is warm and dry.     Comments: With chaperone present  breast exam was performed, left breast was slightly enlarged in comparison to the right breast, there was some overlying erythema around the areola, there is no noted nipple inversion no nipple discharge, no palpable masses on my exam, no noted lymphedema within the left axilla.  Neurological:     Mental Status: She is alert.  Psychiatric:        Mood and Affect: Mood normal.     ED Results / Procedures / Treatments   Labs (all labs ordered are listed, but only abnormal results are displayed) Labs Reviewed - No data to display  EKG None  Radiology DG Chest Portable 1 View  Result Date: 10/13/2022 CLINICAL DATA:  Rales right lower lobe EXAM: PORTABLE CHEST 1 VIEW COMPARISON:  10/09/2022 FINDINGS: Right side Port-A-Cath remains in place, unchanged. Small left pleural effusion, slightly decreased since prior study. No confluent airspace opacities. Heart and mediastinal contours within normal limits. IMPRESSION: Small left pleural effusion, slightly decreased since prior study. Electronically Signed   By: Charlett Nose M.D.   On: 10/13/2022 23:15    Procedures Procedures    Medications Ordered in ED Medications - No data to display  ED Course/ Medical  Decision Making/ A&P                             Medical Decision Making Amount and/or Complexity of Data Reviewed Radiology: ordered.   This patient presents to the ED for concern of left breast pain, this involves an extensive number of treatment options, and is a complaint that carries with it a high risk of complications and morbidity.  The differential diagnosis includes malignancy, cellulitis, abscess,    Additional history obtained:  Additional history obtained from daughter at bedside External records from outside source obtained and reviewed including recent hospitalization   Co morbidities that complicate the patient evaluation  Non small cell lung cancer  Social Determinants of Health:  Geriatric    Lab Tests:  I Ordered, and personally interpreted labs.  The pertinent results include: N/A   Imaging Studies ordered:  I ordered imaging studies including portable chest I independently visualized and interpreted imaging which showed small left pleural effusion decreased from prior imaging I agree with the radiologist interpretation   Cardiac Monitoring:  The patient was maintained on a cardiac monitor.  I personally viewed and interpreted the cardiac monitored which showed an underlying rhythm of: N/A   Medicines ordered and prescription drug management:  I ordered medication including N/A I have reviewed the patients home medicines and have made adjustments as needed  Critical Interventions:  N/A   Reevaluation:  Presents with concerns of left breast swelling will obtain chest x-ray as I heard some rales on my exam and she has a history of pleural effusions.    Consultations Obtained:  N/A    Test Considered:  N/A    Rule out Suspicion patient is admitted for pleural effusion is low at this time she is nontachypneic nonhypoxic x-ray reveals improving left pleural effusion.  Suspicion for breast abscess is low as there is no palpable  mass no noted drainage.  Suspicion for malignancy is low at this time as there is no lymphadenopathy noted on my exam, presentation is atypical etiology.    Dispostion and problem list  After consideration of the diagnostic results and the patients response to treatment, I feel that the patent would benefit from discharge.  Left breast pain-there are some erythema  present possible she might have some overlying cellulitis, will start her on antibiotics, doxycycline since she has an allergy to penicillin. due to her cancer history it certainly would be worth patient obtain a mammogram for further evaluation will refer to the breast imaging clinic             Final Clinical Impression(s) / ED Diagnoses Final diagnoses:  Breast pain    Rx / DC Orders ED Discharge Orders          Ordered    MM 3D SCREENING MAMMOGRAM BILATERAL BREAST        10/13/22 2342    doxycycline (VIBRAMYCIN) 100 MG capsule  2 times daily        10/13/22 2343              Carroll Sage, PA-C 10/13/22 2345    Melene Plan, DO 10/13/22 2345

## 2022-10-13 NOTE — Discharge Instructions (Signed)
Concerned that he might have a small infection over the left breast subsequently antibiotics please take as prescribed.  I would like to follow-up with the breast imaging clinic for further evaluation did place an order for mammogram.  You may also follow-up with your primary doctor for further assessment.  Come back to the emergency department if you develop chest pain, shortness of breath, severe abdominal pain, uncontrolled nausea, vomiting, diarrhea.

## 2022-10-16 ENCOUNTER — Telehealth: Payer: Self-pay

## 2022-10-16 ENCOUNTER — Other Ambulatory Visit (HOSPITAL_COMMUNITY): Payer: Self-pay | Admitting: Internal Medicine

## 2022-10-16 DIAGNOSIS — N6451 Induration of breast: Secondary | ICD-10-CM | POA: Diagnosis not present

## 2022-10-16 DIAGNOSIS — N61 Mastitis without abscess: Secondary | ICD-10-CM | POA: Diagnosis not present

## 2022-10-16 DIAGNOSIS — N644 Mastodynia: Secondary | ICD-10-CM

## 2022-10-16 LAB — HM MAMMOGRAPHY

## 2022-10-16 NOTE — Telephone Encounter (Signed)
Pts daughter has stated the order that was entered for Mammogram screening is incorrect as the pt is needing a Diagnostic mammogram due to being hospitalized for swelling and pain in her breast. Pts daughter is asking that the correct order be placed for a Diagnostic mammogram and not a screening as the pt is not due for a screening until October.

## 2022-10-16 NOTE — Telephone Encounter (Signed)
[facility] for 2:00 this afternoon - please place order this morning so patient can have study done at 2.

## 2022-10-16 NOTE — Telephone Encounter (Signed)
[facility] has called to let us know the order had been placed and is waiting for Dr. Jonny Ruiz to sign off on it. Please do so ASAP.

## 2022-10-16 NOTE — Telephone Encounter (Signed)
Per pt's daughters request we have written orders to have the mammogram done at [facility] instead.   Orders have been signed and faxed.

## 2022-10-17 ENCOUNTER — Telehealth: Payer: Self-pay | Admitting: *Deleted

## 2022-10-17 ENCOUNTER — Telehealth: Payer: Self-pay | Admitting: Internal Medicine

## 2022-10-17 ENCOUNTER — Ambulatory Visit (INDEPENDENT_AMBULATORY_CARE_PROVIDER_SITE_OTHER): Payer: Medicare HMO

## 2022-10-17 ENCOUNTER — Ambulatory Visit (INDEPENDENT_AMBULATORY_CARE_PROVIDER_SITE_OTHER): Payer: Medicare HMO | Admitting: Internal Medicine

## 2022-10-17 ENCOUNTER — Encounter: Payer: Self-pay | Admitting: Internal Medicine

## 2022-10-17 VITALS — BP 118/82 | HR 88 | Temp 97.7°F | Ht 62.0 in | Wt 139.6 lb

## 2022-10-17 DIAGNOSIS — J9601 Acute respiratory failure with hypoxia: Secondary | ICD-10-CM | POA: Diagnosis not present

## 2022-10-17 DIAGNOSIS — D509 Iron deficiency anemia, unspecified: Secondary | ICD-10-CM | POA: Diagnosis not present

## 2022-10-17 DIAGNOSIS — N61 Mastitis without abscess: Secondary | ICD-10-CM | POA: Insufficient documentation

## 2022-10-17 DIAGNOSIS — K219 Gastro-esophageal reflux disease without esophagitis: Secondary | ICD-10-CM | POA: Diagnosis not present

## 2022-10-17 DIAGNOSIS — G309 Alzheimer's disease, unspecified: Secondary | ICD-10-CM | POA: Diagnosis not present

## 2022-10-17 DIAGNOSIS — E785 Hyperlipidemia, unspecified: Secondary | ICD-10-CM | POA: Diagnosis not present

## 2022-10-17 DIAGNOSIS — C3492 Malignant neoplasm of unspecified part of left bronchus or lung: Secondary | ICD-10-CM | POA: Diagnosis not present

## 2022-10-17 DIAGNOSIS — I10 Essential (primary) hypertension: Secondary | ICD-10-CM

## 2022-10-17 DIAGNOSIS — J90 Pleural effusion, not elsewhere classified: Secondary | ICD-10-CM

## 2022-10-17 DIAGNOSIS — J4550 Severe persistent asthma, uncomplicated: Secondary | ICD-10-CM | POA: Diagnosis not present

## 2022-10-17 DIAGNOSIS — E611 Iron deficiency: Secondary | ICD-10-CM | POA: Diagnosis not present

## 2022-10-17 DIAGNOSIS — F0280 Dementia in other diseases classified elsewhere without behavioral disturbance: Secondary | ICD-10-CM

## 2022-10-17 DIAGNOSIS — E039 Hypothyroidism, unspecified: Secondary | ICD-10-CM | POA: Diagnosis not present

## 2022-10-17 DIAGNOSIS — J45909 Unspecified asthma, uncomplicated: Secondary | ICD-10-CM | POA: Diagnosis not present

## 2022-10-17 NOTE — Assessment & Plan Note (Addendum)
Improved by exam, For f/u cxr next visit

## 2022-10-17 NOTE — Telephone Encounter (Signed)
Verbal Orders   Home speech Therapy  1w-4 for  Cognitive deficits Best call back [phone] Angelica [name]

## 2022-10-17 NOTE — Progress Notes (Signed)
Patient ID: Angelica [name], female   DOB: 1941/02/11, 82 y.o.   MRN: [id number]        Chief Complaint: follow up left breast cellulitis, iron def anemia, bilateral pleural effusions with volume overload, dementia       HPI:  Angelica [name] is a 82 y.o. female here overall much improved after hospn with volume overload including bilateral pleural effusions, now with good compliance with torsemide, and no worsening sob or leg swelling, though wt is up several lbs.  Pt denies chest pain, increased sob or doe, wheezing, orthopnea, PND, increased LE swelling, palpitations, dizziness or syncope.   Pt denies polydipsia, polyuria, or new focal neuro s/s.    Pt denies fever, wt loss, night sweats, loss of appetite, or other constitutional symptoms  Has tolerated 2/4 iron infusions and plans to finish soon.  No overt bleeding, bruising.  Post hospn did be seen in ED with left breast cellulitis, good compliance with antibx and now denies left breast pain, redness, swelling, or fever, chills.  Dementia overall stable symptomatically, and not assoc with behavioral changes such as hallucinations, paranoia, or agitation.        Wt Readings from Last 3 Encounters:  10/19/22 138 lb 8 oz (62.8 kg)  10/17/22 139 lb 9.6 oz (63.3 kg)  10/13/22 135 lb (61.2 kg)   BP Readings from Last 3 Encounters:  10/19/22 127/67  10/17/22 118/82  10/13/22 131/72         Past Medical History:  Diagnosis Date   Arthritis    Asthma    Bowel obstruction (HCC)    Cancer (HCC)    Environmental allergies    GERD (gastroesophageal reflux disease)    Glaucoma 05/09/2017   H. pylori infection    History of radiation therapy 07/05/20-08/05/20   Left lung IMRT Dr. Roselind Messier    HLD (hyperlipidemia) 01/16/2019   Hypertension    Iron deficiency anemia    Osteopenia 05/26/2017   Thyroid disease    Urticaria 07/25/2018   Past Surgical History:  Procedure Laterality Date   ABDOMINAL HYSTERECTOMY     BOWEL RESECTION N/A 05/23/2020    Procedure: SMALL BOWEL REPAIR;  Surgeon: Sheliah Hatch, De Blanch, MD;  Location: [facility];  Service: General;  Laterality: N/A;   BREAST BIOPSY     BRONCHIAL BIOPSY  06/10/2020   Procedure: BRONCHIAL BIOPSIES;  Surgeon: [name], DO;  Location: [facility];  Service: Pulmonary;;   BRONCHIAL BRUSHINGS  06/10/2020   Procedure: BRONCHIAL BRUSHINGS;  Surgeon: [name], DO;  Location: [facility];  Service: Pulmonary;;   BRONCHIAL NEEDLE ASPIRATION BIOPSY  06/10/2020   Procedure: BRONCHIAL NEEDLE ASPIRATION BIOPSIES;  Surgeon: [name], DO;  Location: [facility];  Service: Pulmonary;;   BRONCHIAL WASHINGS  06/10/2020   Procedure: BRONCHIAL WASHINGS;  Surgeon: [name], DO;  Location: [facility];  Service: Pulmonary;;   COLON SURGERY     DIAGNOSTIC LARYNGOSCOPY N/A 05/23/2020   Procedure: ATTEMPTED DIAGNOSTIC LAPAROSCOPY WITH ADHESIONS;  Surgeon: Sheliah Hatch, De Blanch, MD;  Location: [facility];  Service: General;  Laterality: N/A;   IR IMAGING GUIDED PORT INSERTION  07/15/2020   KNEE SURGERY     LAPAROTOMY N/A 04/18/2015   Procedure: Exploratory laparotomy with lysis of adhesions, possible bowel resection;  Surgeon: Axel Filler, MD;  Location: [facility];  Service: General;  Laterality: N/A;   LAPAROTOMY N/A 05/23/2020   Procedure: EXPLORATORY LAPAROTOMY;  Surgeon: Rodman Pickle, MD;  Location: [facility];  Service:  General;  Laterality: N/A;   LYSIS OF ADHESION N/A 05/23/2020   Procedure: LYSIS OF ADHESION;  Surgeon: Sheliah Hatch, De Blanch, MD;  Location: [facility];  Service: General;  Laterality: N/A;   NASAL SINUS SURGERY     THYROID SURGERY     VIDEO BRONCHOSCOPY WITH ENDOBRONCHIAL NAVIGATION Bilateral 06/10/2020   Procedure: VIDEO BRONCHOSCOPY WITH ENDOBRONCHIAL NAVIGATION;  Surgeon: [name], DO;  Location[facility];  Service: Pulmonary;  Laterality: Bilateral;   VIDEO BRONCHOSCOPY WITH ENDOBRONCHIAL ULTRASOUND  06/10/2020   Procedure: VIDEO BRONCHOSCOPY WITH  ENDOBRONCHIAL ULTRASOUND;  Surgeon: [name], DO;  Location: [facility];  Service: Pulmonary;;    reports that she has quit smoking. Her smoking use included cigarettes. She has never used smokeless tobacco. She reports that she does not drink alcohol and does not use drugs. family history includes Diabetes in her mother; Glaucoma in her brother and sister; Hypertension in her father and mother. Allergies  Allergen Reactions   Asa Buff (Mag [Buffered Aspirin] Other (See Comments)    Excessive sweating   Ensure [Nutritional Supplements]     Or boost - upset stomach    Fish Allergy Other (See Comments)    Unknown reaction, but allergic   Other Other (See Comments)    Gelcaps; gel-containing capsules; extended-release medication - upset stomach    Peanut-Containing Drug Products Other (See Comments)    From allergy test   Penicillins Itching    Has patient had a PCN reaction causing immediate rash, facial/tongue/throat swelling, SOB or lightheadedness with hypotension: No Has patient had a PCN reaction causing severe rash involving mucus membranes or skin necrosis: No Has patient had a PCN reaction that required hospitalization No Has patient had a PCN reaction occurring within the last 10 years: No If all of the above answers are "NO", then may proceed with Cephalosporin use.   Shellfish-Derived Products Other (See Comments)    From allergy test   Strawberry Extract Other (See Comments)    From allergy test    Current Outpatient Medications on File Prior to Visit  Medication Sig Dispense Refill   acetaminophen (TYLENOL) 500 MG tablet Take 500 mg by mouth every 6 (six) hours as needed for moderate pain.     albuterol (VENTOLIN HFA) 108 (90 Base) MCG/ACT inhaler Inhale 2 puffs into the lungs every 4 (four) hours as needed for wheezing or shortness of breath. 18 g 1   alum & mag hydroxide-simeth (MAALOX/MYLANTA) 200-200-20 MG/5ML suspension Take 15 mLs by mouth every 6 (six) hours  as needed for indigestion or heartburn.     carboxymethylcellulose (REFRESH PLUS) 0.5 % SOLN Place 1 drop into both eyes daily as needed (Uses as needed).     clopidogrel (PLAVIX) 75 MG tablet Take 1 tablet by mouth once daily 90 tablet 3   cyanocobalamin 1000 MCG tablet Take 1 tablet (1,000 mcg total) by mouth daily for 15 days. 15 tablet 0   diclofenac Sodium (VOLTAREN) 1 % GEL Apply 4 g topically 4 (four) times daily. 100 g 0   doxycycline (VIBRAMYCIN) 100 MG capsule Take 1 capsule (100 mg total) by mouth 2 (two) times daily for 7 days. 14 capsule 0   EPINEPHrine (EPIPEN 2-PAK) 0.3 mg/0.3 mL IJ SOAJ injection Use as directed for severe allergic reaction 2 each 1   EQ ALLERGY RELIEF, CETIRIZINE, 10 MG tablet Take 1 tablet by mouth once daily 90 tablet 0   FeFum-FePoly-FA-B Cmp-C-Biot (INTEGRA PLUS) CAPS Take 1 capsule by mouth every  morning. 30 capsule 2   fluticasone (FLONASE) 50 MCG/ACT nasal spray Place 1 spray into both nostrils in the morning and at bedtime. 16 g 5   folic acid (FOLVITE) 1 MG tablet Take 1 tablet (1 mg total) by mouth daily. 90 tablet 0   furosemide (LASIX) 20 MG tablet Take 1 tablet (20 mg total) by mouth daily as needed. For increase swelling 30 tablet 0   ipratropium (ATROVENT) 0.06 % nasal spray Place 2 sprays into both nostrils 4 (four) times daily as needed (nasal drainage). 15 mL 5   levothyroxine (SYNTHROID) 50 MCG tablet Take 1 tablet (50 mcg total) by mouth daily before breakfast. 10 tablet 0   Magnesium Hydroxide (PHILLIPS MILK OF MAGNESIA PO) Take 15-30 mLs by mouth daily as needed (for constipation).     memantine (NAMENDA) 10 MG tablet Take 1 tablet (10 mg total) by mouth 2 (two) times daily. 60 tablet 5   montelukast (SINGULAIR) 10 MG tablet Take 1 tablet (10 mg total) by mouth at bedtime. 30 tablet 5   ondansetron (ZOFRAN-ODT) 4 MG disintegrating tablet Take 1 tablet (4 mg total) by mouth every 8 (eight) hours as needed for nausea or vomiting. 10 tablet 0    pantoprazole (PROTONIX) 40 MG tablet Take 1 tablet (40 mg total) by mouth daily. 90 tablet 1   polyethylene glycol (MIRALAX / GLYCOLAX) 17 g packet Take 17 g by mouth daily as needed for mild constipation.     potassium chloride SA (KLOR-CON M) 20 MEQ tablet Take 2 tablets (40 mEq total) by mouth daily as needed. Take potasium days you take lasix. 10 tablet 0   pravastatin (PRAVACHOL) 80 MG tablet Take 1 tablet by mouth once daily 90 tablet 0   SYMBICORT 80-4.5 MCG/ACT inhaler INHALE 2 PUFFS BY MOUTH IN THE MORNING AND AT BEDTIME 10.2 g 5   Current Facility-Administered Medications on File Prior to Visit  Medication Dose Route Frequency Provider Last Rate Last Admin   Benralizumab SOSY 30 mg  30 mg Subcutaneous Q8 Weeks Marcelyn Bruins, MD   30 mg at 10/17/22 1342        ROS:  All others reviewed and negative.  Objective        PE:  BP 118/82 (BP Location: Left Arm, Patient Position: Sitting, Cuff Size: Normal)   Pulse 88   Temp 97.7 F (36.5 C) (Oral)   Ht 5\' 2"  (1.575 m)   Wt 139 lb 9.6 oz (63.3 kg)   SpO2 99%   BMI 25.53 kg/m                 Constitutional: Pt appears in NAD               HENT: Head: NCAT.                Right Ear: External ear normal.                 Left Ear: External ear normal.                Eyes: . Pupils are equal, round, and reactive to light. Conjunctivae and EOM are normal               Nose: without d/c or deformity               Neck: Neck supple. Gross normal ROM               Cardiovascular: Normal  rate and regular rhythm.                 Pulmonary/Chest: Effort normal and breath sounds without rales or wheezing.                Abd:  Soft, NT, ND, + BS, no organomegaly               Neurological: Pt is alert. At baseline orientation, motor grossly intact               Skin: Skin is warm. No rashes, no other new lesions, LE edema - none               Psychiatric: Pt behavior is normal without agitation   Micro: none  Cardiac tracings  I have personally interpreted today:  none  Pertinent Radiological findings (summarize): none   Lab Results  Component Value Date   WBC 8.8 10/09/2022   HGB 10.5 (L) 10/09/2022   HCT 31.7 (L) 10/09/2022   PLT 193 10/09/2022   GLUCOSE 105 (H) 10/09/2022   CHOL 191 07/18/2021   TRIG 83.0 07/18/2021   HDL 77.50 07/18/2021   LDLCALC 97 07/18/2021   ALT 12 10/07/2022   AST 17 10/07/2022   NA 134 (L) 10/09/2022   K 3.5 10/09/2022   CL 99 10/09/2022   CREATININE 0.80 10/09/2022   BUN 11 10/09/2022   CO2 25 10/09/2022   TSH 3.632 10/05/2022   INR 1.0 11/02/2020   HGBA1C 5.8 07/18/2021   Assessment/Plan:  Angelica [name] is a 82 y.o. Black or African American [2] female with  has a past medical history of Arthritis, Asthma, Bowel obstruction (HCC), Cancer (HCC), Environmental allergies, GERD (gastroesophageal reflux disease), Glaucoma (05/09/2017), H. pylori infection, History of radiation therapy (07/05/20-08/05/20), HLD (hyperlipidemia) (01/16/2019), Hypertension, Iron deficiency anemia, Osteopenia (05/26/2017), Thyroid disease, and Urticaria (07/25/2018).  Bilateral pleural effusion Improved by exam, For f/u cxr next visit  Alzheimer's disease (HCC) Stable without worsening behavior, cont current med tx - namenda 10 bid  Cellulitis of left breast Improving resolving, to finish antibx,  to f/u any worsening symptoms or concerns   Iron deficiency Pt to finish antibx as planned  HTN (hypertension) BP Readings from Last 3 Encounters:  10/19/22 127/67  10/17/22 118/82  10/13/22 131/72   Stable, pt to continue medical treatment  - diet, low sodium,   Followup: Return in about 4 weeks (around 11/14/2022).  Oliver Barre, MD 10/19/2022 9:12 PM [facility]

## 2022-10-17 NOTE — Telephone Encounter (Signed)
Ok for verbals 

## 2022-10-17 NOTE — Telephone Encounter (Signed)
Transition Care Management Follow-up Telephone Call Date of discharge and from where: [facility] long ed 10/14/2022 How have you been since you were released from the hospital? Feeling better has cellulitis and has seen pcp  Any questions or concerns? No  Items Reviewed: Did the pt receive and understand the discharge instructions provided? Yes  Medications obtained and verified? Yes  Other? No  Any new allergies since your discharge? No  Dietary orders reviewed? No Do you have support at home? Yes       Follow up appointments reviewed:  PCP Hospital f/u appt confirmed? Yes  Saw PCP all ready   Are transportation arrangements needed? No  If their condition worsens, is the pt aware to call PCP or go to the Emergency Dept.? Yes Was the patient provided with contact information for the PCP's office or ED? Yes Was to pt encouraged to call back with questions or concerns? Yes Alois Cliche -[facility] [place], [facility] [phone] [address] [place] , [place] [address] Email : [email or web address]

## 2022-10-17 NOTE — Patient Instructions (Signed)
Please continue all other medications as before, including the antibiotic  Please have the pharmacy call with any other refills you may need.  Please continue your efforts at being more active, low cholesterol diet, and weight control..  Please keep your appointments with your specialists as you may have planned  No further lab test or xray needed today  Please make an Appointment to return in 1 months, or sooner if needed

## 2022-10-18 DIAGNOSIS — E785 Hyperlipidemia, unspecified: Secondary | ICD-10-CM | POA: Diagnosis not present

## 2022-10-18 DIAGNOSIS — I10 Essential (primary) hypertension: Secondary | ICD-10-CM | POA: Diagnosis not present

## 2022-10-18 DIAGNOSIS — J9601 Acute respiratory failure with hypoxia: Secondary | ICD-10-CM | POA: Diagnosis not present

## 2022-10-18 DIAGNOSIS — D509 Iron deficiency anemia, unspecified: Secondary | ICD-10-CM | POA: Diagnosis not present

## 2022-10-18 DIAGNOSIS — C3492 Malignant neoplasm of unspecified part of left bronchus or lung: Secondary | ICD-10-CM | POA: Diagnosis not present

## 2022-10-18 DIAGNOSIS — J45909 Unspecified asthma, uncomplicated: Secondary | ICD-10-CM | POA: Diagnosis not present

## 2022-10-18 DIAGNOSIS — F0280 Dementia in other diseases classified elsewhere without behavioral disturbance: Secondary | ICD-10-CM | POA: Diagnosis not present

## 2022-10-18 DIAGNOSIS — K219 Gastro-esophageal reflux disease without esophagitis: Secondary | ICD-10-CM | POA: Diagnosis not present

## 2022-10-18 DIAGNOSIS — E039 Hypothyroidism, unspecified: Secondary | ICD-10-CM | POA: Diagnosis not present

## 2022-10-18 NOTE — Telephone Encounter (Signed)
Called and left voicemail giving verbals.

## 2022-10-19 ENCOUNTER — Encounter: Payer: Self-pay | Admitting: Internal Medicine

## 2022-10-19 ENCOUNTER — Inpatient Hospital Stay: Payer: Medicare HMO

## 2022-10-19 VITALS — BP 127/67 | HR 79 | Temp 97.7°F | Resp 20 | Ht 62.0 in | Wt 138.5 lb

## 2022-10-19 DIAGNOSIS — D509 Iron deficiency anemia, unspecified: Secondary | ICD-10-CM

## 2022-10-19 DIAGNOSIS — C3432 Malignant neoplasm of lower lobe, left bronchus or lung: Secondary | ICD-10-CM | POA: Diagnosis not present

## 2022-10-19 DIAGNOSIS — Z95828 Presence of other vascular implants and grafts: Secondary | ICD-10-CM

## 2022-10-19 DIAGNOSIS — J90 Pleural effusion, not elsewhere classified: Secondary | ICD-10-CM | POA: Diagnosis not present

## 2022-10-19 DIAGNOSIS — D649 Anemia, unspecified: Secondary | ICD-10-CM | POA: Diagnosis not present

## 2022-10-19 DIAGNOSIS — Z90710 Acquired absence of both cervix and uterus: Secondary | ICD-10-CM | POA: Diagnosis not present

## 2022-10-19 MED ORDER — SODIUM CHLORIDE 0.9% FLUSH
10.0000 mL | Freq: Once | INTRAVENOUS | Status: AC | PRN
  Administered 2022-10-19: 10 mL

## 2022-10-19 MED ORDER — HEPARIN SOD (PORK) LOCK FLUSH 100 UNIT/ML IV SOLN
500.0000 [IU] | Freq: Once | INTRAVENOUS | Status: AC | PRN
  Administered 2022-10-19: 500 [IU]

## 2022-10-19 MED ORDER — CETIRIZINE HCL 10 MG PO TABS
10.0000 mg | ORAL_TABLET | Freq: Once | ORAL | Status: AC
  Administered 2022-10-19: 10 mg via ORAL
  Filled 2022-10-19: qty 1

## 2022-10-19 MED ORDER — ALTEPLASE 2 MG IJ SOLR
2.0000 mg | Freq: Once | INTRAMUSCULAR | Status: AC | PRN
  Administered 2022-10-19: 2 mg
  Filled 2022-10-19: qty 2

## 2022-10-19 MED ORDER — SODIUM CHLORIDE 0.9 % IV SOLN
300.0000 mg | Freq: Once | INTRAVENOUS | Status: AC
  Administered 2022-10-19: 300 mg via INTRAVENOUS
  Filled 2022-10-19: qty 300

## 2022-10-19 MED ORDER — SODIUM CHLORIDE 0.9 % IV SOLN: Freq: Once | INTRAVENOUS | Status: AC

## 2022-10-19 MED ORDER — ACETAMINOPHEN 325 MG PO TABS
650.0000 mg | ORAL_TABLET | Freq: Once | ORAL | Status: AC
  Administered 2022-10-19: 650 mg via ORAL
  Filled 2022-10-19: qty 2

## 2022-10-19 NOTE — Patient Instructions (Signed)

## 2022-10-19 NOTE — Assessment & Plan Note (Signed)
Pt to finish antibx as planned

## 2022-10-19 NOTE — Assessment & Plan Note (Signed)
Stable without worsening behavior, cont current med tx - namenda 10 bid

## 2022-10-19 NOTE — Assessment & Plan Note (Signed)
BP Readings from Last 3 Encounters:  10/19/22 127/67  10/17/22 118/82  10/13/22 131/72   Stable, pt to continue medical treatment  - diet, low sodium,

## 2022-10-19 NOTE — Assessment & Plan Note (Signed)
Improving resolving, to finish antibx,  to f/u any worsening symptoms or concerns

## 2022-10-20 ENCOUNTER — Other Ambulatory Visit: Payer: Self-pay | Admitting: Internal Medicine

## 2022-10-22 ENCOUNTER — Other Ambulatory Visit: Payer: Self-pay

## 2022-10-22 ENCOUNTER — Encounter: Payer: Self-pay | Admitting: Family Medicine

## 2022-10-22 ENCOUNTER — Ambulatory Visit (INDEPENDENT_AMBULATORY_CARE_PROVIDER_SITE_OTHER): Payer: Medicare HMO | Admitting: Family Medicine

## 2022-10-22 VITALS — BP 122/80 | HR 72 | Temp 97.8°F | Resp 20 | Ht 62.0 in | Wt 138.0 lb

## 2022-10-22 DIAGNOSIS — M7989 Other specified soft tissue disorders: Secondary | ICD-10-CM | POA: Diagnosis not present

## 2022-10-22 DIAGNOSIS — N61 Mastitis without abscess: Secondary | ICD-10-CM | POA: Diagnosis not present

## 2022-10-22 MED ORDER — CEPHALEXIN 500 MG PO CAPS
500.0000 mg | ORAL_CAPSULE | Freq: Two times a day (BID) | ORAL | 0 refills | Status: AC
Start: 2022-10-22 — End: 2022-11-01

## 2022-10-22 NOTE — Progress Notes (Signed)
Assessment & Plan:  1. Mastitis Education provided on mastitis.  Patient to stop doxycycline since symptoms are worsening and not improving.  Started on Keflex today.  Encouraged Tylenol and heat. - cephALEXin (KEFLEX) 500 MG capsule; Take 1 capsule (500 mg total) by mouth 2 (two) times daily for 10 days.  Dispense: 20 capsule; Refill: 0  2. Left arm swelling Encourage patient to elevate her arm is much as possible to help with the swelling.   Follow up plan: Return if symptoms worsen or fail to improve.  Angelica Boston, MSN, APRN, FNP-C  Subjective:  HPI: Angelica [name] is a 82 y.o. female presenting on 10/22/2022 for left breast swelling  (Follow up from ER visit on 5/25 and recent US/ Mammo - almost finished with DOXY and still having tenderness, redness and swelling of the breast and some swelling of the left arm )  Patient is accompanied by her two daughters, who she is okay with being present.  Patient was seen in the emergency room on 10/13/2022 and diagnosed with left breast cellulitis.  Her daughter reports the redness around her areola has not improved and the swelling has gotten worse.  Swelling is now present in the right breast as well. Patient does report tenderness of the left breast.  She did have a diagnostic mammogram and ultrasound completed that was consistent with mastitis. She has been taking Doxycycline as prescribed.     ROS: Negative unless specifically indicated above in HPI.   Relevant past medical history reviewed and updated as indicated.   Allergies and medications reviewed and updated.   Current Outpatient Medications:    acetaminophen (TYLENOL) 500 MG tablet, Take 500 mg by mouth every 6 (six) hours as needed for moderate pain., Disp: , Rfl:    albuterol (VENTOLIN HFA) 108 (90 Base) MCG/ACT inhaler, Inhale 2 puffs into the lungs every 4 (four) hours as needed for wheezing or shortness of breath., Disp: 18 g, Rfl: 1   alum & mag hydroxide-simeth  (MAALOX/MYLANTA) 200-200-20 MG/5ML suspension, Take 15 mLs by mouth every 6 (six) hours as needed for indigestion or heartburn., Disp: , Rfl:    carboxymethylcellulose (REFRESH PLUS) 0.5 % SOLN, Place 1 drop into both eyes daily as needed (Uses as needed)., Disp: , Rfl:    clopidogrel (PLAVIX) 75 MG tablet, Take 1 tablet by mouth once daily, Disp: 90 tablet, Rfl: 3   cyanocobalamin 1000 MCG tablet, Take 1 tablet (1,000 mcg total) by mouth daily for 15 days., Disp: 15 tablet, Rfl: 0   diclofenac Sodium (VOLTAREN) 1 % GEL, Apply 4 g topically 4 (four) times daily., Disp: 100 g, Rfl: 0   EPINEPHrine (EPIPEN 2-PAK) 0.3 mg/0.3 mL IJ SOAJ injection, Use as directed for severe allergic reaction, Disp: 2 each, Rfl: 1   EQ ALLERGY RELIEF, CETIRIZINE, 10 MG tablet, Take 1 tablet by mouth once daily, Disp: 90 tablet, Rfl: 0   FeFum-FePoly-FA-B Cmp-C-Biot (INTEGRA PLUS) CAPS, Take 1 capsule by mouth every morning., Disp: 30 capsule, Rfl: 2   fluticasone (FLONASE) 50 MCG/ACT nasal spray, Place 1 spray into both nostrils in the morning and at bedtime., Disp: 16 g, Rfl: 5   folic acid (FOLVITE) 1 MG tablet, Take 1 tablet (1 mg total) by mouth daily., Disp: 90 tablet, Rfl: 0   furosemide (LASIX) 20 MG tablet, Take 1 tablet (20 mg total) by mouth daily as needed. For increase swelling, Disp: 30 tablet, Rfl: 0   ipratropium (ATROVENT) 0.06 % nasal spray, Place  2 sprays into both nostrils 4 (four) times daily as needed (nasal drainage)., Disp: 15 mL, Rfl: 5   levothyroxine (SYNTHROID) 50 MCG tablet, Take 1 tablet (50 mcg total) by mouth daily before breakfast., Disp: 10 tablet, Rfl: 0   Magnesium Hydroxide (PHILLIPS MILK OF MAGNESIA PO), Take 15-30 mLs by mouth daily as needed (for constipation)., Disp: , Rfl:    memantine (NAMENDA) 10 MG tablet, Take 1 tablet (10 mg total) by mouth 2 (two) times daily., Disp: 60 tablet, Rfl: 5   montelukast (SINGULAIR) 10 MG tablet, Take 1 tablet (10 mg total) by mouth at bedtime.,  Disp: 30 tablet, Rfl: 5   ondansetron (ZOFRAN-ODT) 4 MG disintegrating tablet, Take 1 tablet (4 mg total) by mouth every 8 (eight) hours as needed for nausea or vomiting., Disp: 10 tablet, Rfl: 0   pantoprazole (PROTONIX) 40 MG tablet, Take 1 tablet (40 mg total) by mouth daily., Disp: 90 tablet, Rfl: 1   polyethylene glycol (MIRALAX / GLYCOLAX) 17 g packet, Take 17 g by mouth daily as needed for mild constipation., Disp: , Rfl:    potassium chloride SA (KLOR-CON M) 20 MEQ tablet, Take 2 tablets (40 mEq total) by mouth daily as needed. Take potasium days you take lasix., Disp: 10 tablet, Rfl: 0   pravastatin (PRAVACHOL) 80 MG tablet, Take 1 tablet by mouth once daily, Disp: 90 tablet, Rfl: 0   SYMBICORT 80-4.5 MCG/ACT inhaler, INHALE 2 PUFFS BY MOUTH IN THE MORNING AND AT BEDTIME, Disp: 10.2 g, Rfl: 5  Current Facility-Administered Medications:    Benralizumab SOSY 30 mg, 30 mg, Subcutaneous, Q8 Weeks, Padgett, Pilar Grammes, MD, 30 mg at 10/17/22 1342  Allergies  Allergen Reactions   Asa Buff (Mag [Buffered Aspirin] Other (See Comments)    Excessive sweating   Ensure [Nutritional Supplements]     Or boost - upset stomach    Fish Allergy Other (See Comments)    Unknown reaction, but allergic   Other Other (See Comments)    Gelcaps; gel-containing capsules; extended-release medication - upset stomach    Peanut-Containing Drug Products Other (See Comments)    From allergy test   Penicillins Itching    Has patient had a PCN reaction causing immediate rash, facial/tongue/throat swelling, SOB or lightheadedness with hypotension: No Has patient had a PCN reaction causing severe rash involving mucus membranes or skin necrosis: No Has patient had a PCN reaction that required hospitalization No Has patient had a PCN reaction occurring within the last 10 years: No If all of the above answers are "NO", then may proceed with Cephalosporin use.   Shellfish-Derived Products Other (See Comments)     From allergy test   Strawberry Extract Other (See Comments)    From allergy test     Objective:   BP 122/80   Pulse 72   Temp 97.8 F (36.6 C)   Resp 20   Ht 5\' 2"  (1.575 m)   Wt 138 lb (62.6 kg)   BMI 25.24 kg/m    Physical Exam Vitals reviewed. Exam conducted with a chaperone present (J. Bullins).  Constitutional:      General: She is not in acute distress.    Appearance: Normal appearance. She is not ill-appearing, toxic-appearing or diaphoretic.  HENT:     Head: Normocephalic and atraumatic.  Eyes:     General: No scleral icterus.       Right eye: No discharge.        Left eye: No discharge.  Conjunctiva/sclera: Conjunctivae normal.  Cardiovascular:     Rate and Rhythm: Normal rate.  Pulmonary:     Effort: Pulmonary effort is normal. No respiratory distress.  Chest:  Breasts:    Tanner Score is 5.     Breasts are asymmetrical.     Right: Swelling and skin change (Peau d'orange mild in comparison to the left breast) present. No bleeding, inverted nipple, mass, nipple discharge or tenderness.     Left: Swelling, skin change (Peau d'orange throughout breast) and tenderness present. No bleeding, inverted nipple, mass or nipple discharge.  Musculoskeletal:        General: Normal range of motion.     Right upper arm: No swelling.     Left upper arm: Swelling present.     Cervical back: Normal range of motion.  Skin:    General: Skin is warm and dry.     Capillary Refill: Capillary refill takes less than 2 seconds.     Findings: Erythema (left side of left breast around areola) present.  Neurological:     General: No focal deficit present.     Mental Status: She is alert and oriented to person, place, and time. Mental status is at baseline.  Psychiatric:        Mood and Affect: Mood normal.        Behavior: Behavior normal.        Thought Content: Thought content normal.        Judgment: Judgment normal.

## 2022-10-22 NOTE — Patient Instructions (Addendum)
Stop Doxycycline. Start Keflex.   Apply heat for comfort.  Elevate arm on pillows.

## 2022-10-23 DIAGNOSIS — E785 Hyperlipidemia, unspecified: Secondary | ICD-10-CM | POA: Diagnosis not present

## 2022-10-23 DIAGNOSIS — E039 Hypothyroidism, unspecified: Secondary | ICD-10-CM | POA: Diagnosis not present

## 2022-10-23 DIAGNOSIS — I10 Essential (primary) hypertension: Secondary | ICD-10-CM | POA: Diagnosis not present

## 2022-10-23 DIAGNOSIS — C3492 Malignant neoplasm of unspecified part of left bronchus or lung: Secondary | ICD-10-CM | POA: Diagnosis not present

## 2022-10-23 DIAGNOSIS — J9601 Acute respiratory failure with hypoxia: Secondary | ICD-10-CM | POA: Diagnosis not present

## 2022-10-23 DIAGNOSIS — D509 Iron deficiency anemia, unspecified: Secondary | ICD-10-CM | POA: Diagnosis not present

## 2022-10-23 DIAGNOSIS — K219 Gastro-esophageal reflux disease without esophagitis: Secondary | ICD-10-CM | POA: Diagnosis not present

## 2022-10-23 DIAGNOSIS — J45909 Unspecified asthma, uncomplicated: Secondary | ICD-10-CM | POA: Diagnosis not present

## 2022-10-23 DIAGNOSIS — F0280 Dementia in other diseases classified elsewhere without behavioral disturbance: Secondary | ICD-10-CM | POA: Diagnosis not present

## 2022-10-25 ENCOUNTER — Other Ambulatory Visit: Payer: Self-pay | Admitting: Internal Medicine

## 2022-10-25 DIAGNOSIS — Z1231 Encounter for screening mammogram for malignant neoplasm of breast: Secondary | ICD-10-CM

## 2022-10-26 ENCOUNTER — Inpatient Hospital Stay: Payer: Medicare HMO | Attending: Internal Medicine

## 2022-10-26 ENCOUNTER — Other Ambulatory Visit: Payer: Self-pay

## 2022-10-26 VITALS — BP 140/76 | HR 67 | Temp 97.6°F | Resp 18

## 2022-10-26 DIAGNOSIS — Z95828 Presence of other vascular implants and grafts: Secondary | ICD-10-CM

## 2022-10-26 DIAGNOSIS — C3492 Malignant neoplasm of unspecified part of left bronchus or lung: Secondary | ICD-10-CM | POA: Diagnosis not present

## 2022-10-26 DIAGNOSIS — E785 Hyperlipidemia, unspecified: Secondary | ICD-10-CM | POA: Diagnosis not present

## 2022-10-26 DIAGNOSIS — K219 Gastro-esophageal reflux disease without esophagitis: Secondary | ICD-10-CM | POA: Diagnosis not present

## 2022-10-26 DIAGNOSIS — D509 Iron deficiency anemia, unspecified: Secondary | ICD-10-CM | POA: Insufficient documentation

## 2022-10-26 DIAGNOSIS — J9601 Acute respiratory failure with hypoxia: Secondary | ICD-10-CM | POA: Diagnosis not present

## 2022-10-26 DIAGNOSIS — F0280 Dementia in other diseases classified elsewhere without behavioral disturbance: Secondary | ICD-10-CM | POA: Diagnosis not present

## 2022-10-26 DIAGNOSIS — E039 Hypothyroidism, unspecified: Secondary | ICD-10-CM | POA: Diagnosis not present

## 2022-10-26 DIAGNOSIS — I10 Essential (primary) hypertension: Secondary | ICD-10-CM | POA: Diagnosis not present

## 2022-10-26 DIAGNOSIS — C3432 Malignant neoplasm of lower lobe, left bronchus or lung: Secondary | ICD-10-CM | POA: Diagnosis not present

## 2022-10-26 DIAGNOSIS — J45909 Unspecified asthma, uncomplicated: Secondary | ICD-10-CM | POA: Diagnosis not present

## 2022-10-26 MED ORDER — ACETAMINOPHEN 325 MG PO TABS
650.0000 mg | ORAL_TABLET | Freq: Once | ORAL | Status: AC
  Administered 2022-10-26: 650 mg via ORAL
  Filled 2022-10-26: qty 2

## 2022-10-26 MED ORDER — SODIUM CHLORIDE 0.9 % IV SOLN: Freq: Once | INTRAVENOUS | Status: AC

## 2022-10-26 MED ORDER — HEPARIN SOD (PORK) LOCK FLUSH 100 UNIT/ML IV SOLN
500.0000 [IU] | Freq: Once | INTRAVENOUS | Status: AC | PRN
  Administered 2022-10-26: 500 [IU]

## 2022-10-26 MED ORDER — ALTEPLASE 2 MG IJ SOLR: 2.0000 mg | Freq: Once | INTRAMUSCULAR | Status: DC | PRN

## 2022-10-26 MED ORDER — SODIUM CHLORIDE 0.9 % IV SOLN
300.0000 mg | Freq: Once | INTRAVENOUS | Status: AC
  Administered 2022-10-26: 300 mg via INTRAVENOUS
  Filled 2022-10-26: qty 300

## 2022-10-26 MED ORDER — SODIUM CHLORIDE 0.9% FLUSH: 3.0000 mL | Freq: Once | INTRAVENOUS | Status: DC | PRN

## 2022-10-26 MED ORDER — HEPARIN SOD (PORK) LOCK FLUSH 100 UNIT/ML IV SOLN: 250.0000 [IU] | Freq: Once | INTRAVENOUS | Status: DC | PRN

## 2022-10-26 MED ORDER — SODIUM CHLORIDE 0.9% FLUSH
10.0000 mL | Freq: Once | INTRAVENOUS | Status: AC | PRN
  Administered 2022-10-26: 10 mL

## 2022-10-26 MED ORDER — CETIRIZINE HCL 10 MG PO TABS
10.0000 mg | ORAL_TABLET | Freq: Once | ORAL | Status: AC
  Administered 2022-10-26: 10 mg via ORAL
  Filled 2022-10-26: qty 1

## 2022-10-26 NOTE — Patient Instructions (Signed)

## 2022-10-31 DIAGNOSIS — J9601 Acute respiratory failure with hypoxia: Secondary | ICD-10-CM | POA: Diagnosis not present

## 2022-10-31 DIAGNOSIS — I10 Essential (primary) hypertension: Secondary | ICD-10-CM | POA: Diagnosis not present

## 2022-10-31 DIAGNOSIS — E039 Hypothyroidism, unspecified: Secondary | ICD-10-CM | POA: Diagnosis not present

## 2022-10-31 DIAGNOSIS — F0280 Dementia in other diseases classified elsewhere without behavioral disturbance: Secondary | ICD-10-CM | POA: Diagnosis not present

## 2022-10-31 DIAGNOSIS — E785 Hyperlipidemia, unspecified: Secondary | ICD-10-CM | POA: Diagnosis not present

## 2022-10-31 DIAGNOSIS — D509 Iron deficiency anemia, unspecified: Secondary | ICD-10-CM | POA: Diagnosis not present

## 2022-10-31 DIAGNOSIS — J45909 Unspecified asthma, uncomplicated: Secondary | ICD-10-CM | POA: Diagnosis not present

## 2022-10-31 DIAGNOSIS — K219 Gastro-esophageal reflux disease without esophagitis: Secondary | ICD-10-CM | POA: Diagnosis not present

## 2022-10-31 DIAGNOSIS — C3492 Malignant neoplasm of unspecified part of left bronchus or lung: Secondary | ICD-10-CM | POA: Diagnosis not present

## 2022-11-02 ENCOUNTER — Ambulatory Visit (INDEPENDENT_AMBULATORY_CARE_PROVIDER_SITE_OTHER): Payer: Medicare HMO | Admitting: Family Medicine

## 2022-11-02 ENCOUNTER — Encounter: Payer: Self-pay | Admitting: Family Medicine

## 2022-11-02 VITALS — BP 128/82 | HR 81 | Temp 98.2°F | Ht 62.0 in | Wt 141.8 lb

## 2022-11-02 DIAGNOSIS — N61 Mastitis without abscess: Secondary | ICD-10-CM

## 2022-11-02 DIAGNOSIS — J90 Pleural effusion, not elsewhere classified: Secondary | ICD-10-CM

## 2022-11-02 LAB — BASIC METABOLIC PANEL
BUN: 10 mg/dL (ref 7–25)
CO2: 26 mmol/L (ref 20–32)
Calcium: 9.4 mg/dL (ref 8.6–10.4)
Chloride: 104 mmol/L (ref 98–110)
Creat: 0.86 mg/dL (ref 0.60–0.95)
Glucose, Bld: 117 mg/dL — ABNORMAL HIGH (ref 65–99)
Potassium: 3.7 mmol/L (ref 3.5–5.3)
Sodium: 138 mmol/L (ref 135–146)

## 2022-11-02 LAB — CBC WITH DIFFERENTIAL/PLATELET
Absolute Monocytes: 439 cells/uL (ref 200–950)
Basophils Absolute: 29 cells/uL (ref 0–200)
Basophils Relative: 0.5 %
Eosinophils Absolute: 0 cells/uL — ABNORMAL LOW (ref 15–500)
Eosinophils Relative: 0 %
HCT: 38.2 % (ref 35.0–45.0)
Hemoglobin: 12.5 g/dL (ref 11.7–15.5)
Lymphs Abs: 1471 cells/uL (ref 850–3900)
MCH: 28.6 pg (ref 27.0–33.0)
MCHC: 32.7 g/dL (ref 32.0–36.0)
MCV: 87.4 fL (ref 80.0–100.0)
MPV: 10.6 fL (ref 7.5–12.5)
Monocytes Relative: 7.7 %
Neutro Abs: 3762 cells/uL (ref 1500–7800)
Neutrophils Relative %: 66 %
Platelets: 230 10*3/uL (ref 140–400)
RBC: 4.37 10*6/uL (ref 3.80–5.10)
RDW: 16.4 % — ABNORMAL HIGH (ref 11.0–15.0)
Total Lymphocyte: 25.8 %
WBC: 5.7 10*3/uL (ref 3.8–10.8)

## 2022-11-02 MED ORDER — DOXYCYCLINE HYCLATE 100 MG PO TABS
100.0000 mg | ORAL_TABLET | Freq: Two times a day (BID) | ORAL | 0 refills | Status: DC
Start: 2022-11-02 — End: 2022-11-29

## 2022-11-02 MED ORDER — POTASSIUM CHLORIDE CRYS ER 20 MEQ PO TBCR: 40.0000 meq | EXTENDED_RELEASE_TABLET | Freq: Every day | ORAL | 0 refills | Status: DC | PRN

## 2022-11-02 NOTE — Progress Notes (Unsigned)
Patient ID: Angelica [name], female    DOB: [birth year], 82 y.o.   MRN: [id number]  This visit was conducted in person.  BP 128/82   Pulse 81   Temp 98.2 F (36.8 C) (Oral)   Ht 5\' 2"  (1.575 m)   Wt 141 lb 12.8 oz (64.3 kg)   SpO2 95%   BMI 25.94 kg/m    CC:  Chief Complaint  Patient presents with   Edema    Facial swelling, left arm swelling and left breast is still swollen.    Subjective:   HPI: Angelica [name] is a 82 y.o. female  pt of Dr. Raphael Gibney with Alzheimer's dementia, recent hospitalization with left bilateral pleural effusions and history of non-small cell lung cancer presenting on 11/02/2022 for Edema (Facial swelling, left arm swelling and left breast is still swollen.)  Past medical history of including non-small cell lung cancer left lower lobe currently in remission, hypertension, history of pleural effusions, was seen in the emergency room on May for 25th 2024.  Note reviewed in detail.  At that time she had noticed left breast heaviness and tenderness Diagnosed with left breast cellulitis.  Started on doxycycline x 7 days given allergy to penicillin.  Reviewed office visit note from May 25 with her PCP Dr. Jonny Ruiz At that point the breast redness was improving. Has follow-up with PCP on June 26.   Mammogram at [facility] on May 29 showed diffuse changes suggestive of mastitis, ultrasound showed no sonographic evidence of malignancy, she had continued mastitis changes   6/3.. saw NP Alona Bene.Marland Kitchen stopped doxy change to keflex.  Saw another practitioner.. changed back to doxy x 10 days.... that started working significant.  Today she reports  yesterday was last day of doxy.  The  left breast red, swollen, minimally painful... 80 % better overall. Left arm swelling ongoing, hand and  arm  No fever, no body aches.   On  20 mg lasix for pleural effusions. Needs refill of potassium.  Propping up left arm.      Relevant past medical, surgical, family and social history  reviewed and updated as indicated. Interim medical history since our last visit reviewed. Allergies and medications reviewed and updated. Outpatient Medications Prior to Visit  Medication Sig Dispense Refill   acetaminophen (TYLENOL) 500 MG tablet Take 500 mg by mouth every 6 (six) hours as needed for moderate pain.     albuterol (VENTOLIN HFA) 108 (90 Base) MCG/ACT inhaler Inhale 2 puffs into the lungs every 4 (four) hours as needed for wheezing or shortness of breath. 18 g 1   alum & mag hydroxide-simeth (MAALOX/MYLANTA) 200-200-20 MG/5ML suspension Take 15 mLs by mouth every 6 (six) hours as needed for indigestion or heartburn.     carboxymethylcellulose (REFRESH PLUS) 0.5 % SOLN Place 1 drop into both eyes daily as needed (Uses as needed).     clopidogrel (PLAVIX) 75 MG tablet Take 1 tablet by mouth once daily 90 tablet 3   diclofenac Sodium (VOLTAREN) 1 % GEL Apply 4 g topically 4 (four) times daily. 100 g 0   EPINEPHrine (EPIPEN 2-PAK) 0.3 mg/0.3 mL IJ SOAJ injection Use as directed for severe allergic reaction 2 each 1   EQ ALLERGY RELIEF, CETIRIZINE, 10 MG tablet Take 1 tablet by mouth once daily 90 tablet 0   fluticasone (FLONASE) 50 MCG/ACT nasal spray Place 1 spray into both nostrils in the morning and at bedtime. 16 g 5   folic acid (FOLVITE) 1  MG tablet Take 1 tablet (1 mg total) by mouth daily. 90 tablet 0   ipratropium (ATROVENT) 0.06 % nasal spray Place 2 sprays into both nostrils 4 (four) times daily as needed (nasal drainage). 15 mL 5   levothyroxine (SYNTHROID) 50 MCG tablet Take 1 tablet (50 mcg total) by mouth daily before breakfast. 10 tablet 0   Magnesium Hydroxide (PHILLIPS MILK OF MAGNESIA PO) Take 15-30 mLs by mouth daily as needed (for constipation).     memantine (NAMENDA) 10 MG tablet Take 1 tablet (10 mg total) by mouth 2 (two) times daily. 60 tablet 5   montelukast (SINGULAIR) 10 MG tablet Take 1 tablet (10 mg total) by mouth at bedtime. 30 tablet 5   ondansetron  (ZOFRAN-ODT) 4 MG disintegrating tablet Take 1 tablet (4 mg total) by mouth every 8 (eight) hours as needed for nausea or vomiting. 10 tablet 0   pantoprazole (PROTONIX) 40 MG tablet Take 1 tablet (40 mg total) by mouth daily. 90 tablet 1   polyethylene glycol (MIRALAX / GLYCOLAX) 17 g packet Take 17 g by mouth daily as needed for mild constipation.     pravastatin (PRAVACHOL) 80 MG tablet Take 1 tablet by mouth once daily 90 tablet 0   SYMBICORT 80-4.5 MCG/ACT inhaler INHALE 2 PUFFS BY MOUTH IN THE MORNING AND AT BEDTIME 10.2 g 5   furosemide (LASIX) 20 MG tablet Take 1 tablet (20 mg total) by mouth daily as needed. For increase swelling 30 tablet 0   potassium chloride SA (KLOR-CON M) 20 MEQ tablet Take 2 tablets (40 mEq total) by mouth daily as needed. Take potasium days you take lasix. 10 tablet 0   FeFum-FePoly-FA-B Cmp-C-Biot (INTEGRA PLUS) CAPS Take 1 capsule by mouth every morning. 30 capsule 2   Facility-Administered Medications Prior to Visit  Medication Dose Route Frequency Provider Last Rate Last Admin   Benralizumab SOSY 30 mg  30 mg Subcutaneous Q8 Weeks Marcelyn Bruins, MD   30 mg at 10/17/22 1342     Per HPI unless specifically indicated in ROS section below Review of Systems  Constitutional:  Negative for fatigue and fever.  HENT:  Negative for congestion.        No facial swelling noted, daughter has not noted facial swelling either.  She feels her mother is just talking about the changes in her face with aging.  Eyes:  Negative for pain.  Respiratory:  Negative for cough and shortness of breath.   Cardiovascular:  Negative for chest pain, palpitations and leg swelling.  Gastrointestinal:  Negative for abdominal pain.  Genitourinary:  Negative for dysuria and vaginal bleeding.  Musculoskeletal:  Negative for back pain.  Neurological:  Negative for syncope, light-headedness and headaches.  Psychiatric/Behavioral:  Negative for dysphoric mood.    Objective:  BP  128/82   Pulse 81   Temp 98.2 F (36.8 C) (Oral)   Ht 5\' 2"  (1.575 m)   Wt 141 lb 12.8 oz (64.3 kg)   SpO2 95%   BMI 25.94 kg/m   Wt Readings from Last 3 Encounters:  11/06/22 144 lb (65.3 kg)  11/02/22 141 lb 12.8 oz (64.3 kg)  10/22/22 138 lb (62.6 kg)      Physical Exam Constitutional:      General: She is not in acute distress.    Appearance: Normal appearance. She is well-developed. She is not ill-appearing or toxic-appearing.  HENT:     Head: Normocephalic.     Right Ear: Hearing, tympanic membrane, ear  canal and external ear normal. Tympanic membrane is not erythematous, retracted or bulging.     Left Ear: Hearing, tympanic membrane, ear canal and external ear normal. Tympanic membrane is not erythematous, retracted or bulging.     Nose: No mucosal edema or rhinorrhea.     Right Sinus: No maxillary sinus tenderness or frontal sinus tenderness.     Left Sinus: No maxillary sinus tenderness or frontal sinus tenderness.     Mouth/Throat:     Pharynx: Uvula midline.  Eyes:     General: Lids are normal. Lids are everted, no foreign bodies appreciated.     Conjunctiva/sclera: Conjunctivae normal.     Pupils: Pupils are equal, round, and reactive to light.  Neck:     Thyroid: No thyroid mass or thyromegaly.     Vascular: No carotid bruit.     Trachea: Trachea normal.  Cardiovascular:     Rate and Rhythm: Normal rate and regular rhythm.     Pulses: Normal pulses.     Heart sounds: Normal heart sounds, S1 normal and S2 normal. No murmur heard.    No friction rub. No gallop.  Pulmonary:     Effort: Pulmonary effort is normal. No tachypnea or respiratory distress.     Breath sounds: Normal breath sounds. No decreased breath sounds, wheezing, rhonchi or rales.  Chest:  Breasts:    Breasts are asymmetrical.     Left: Skin change present. No tenderness.     Comments: Erythema and edema in the left breast, per daughter significantly improved from previous.  Minimal pain to  palpation.  Slight increase in warmth. Abdominal:     General: Bowel sounds are normal.     Palpations: Abdomen is soft.     Tenderness: There is no abdominal tenderness.  Musculoskeletal:     Cervical back: Normal range of motion and neck supple.  Skin:    General: Skin is warm and dry.     Findings: No rash.  Neurological:     Mental Status: She is alert.  Psychiatric:        Mood and Affect: Mood is not anxious or depressed.        Speech: Speech normal.        Behavior: Behavior normal. Behavior is cooperative.        Thought Content: Thought content normal.        Judgment: Judgment normal.       Results for orders placed or performed in visit on 11/02/22  CBC with Differential/Platelet  Result Value Ref Range   WBC 5.7 3.8 - 10.8 Thousand/uL   RBC 4.37 3.80 - 5.10 Million/uL   Hemoglobin 12.5 11.7 - 15.5 g/dL   HCT 16.1 09.6 - 04.5 %   MCV 87.4 80.0 - 100.0 fL   MCH 28.6 27.0 - 33.0 pg   MCHC 32.7 32.0 - 36.0 g/dL   RDW 40.9 (H) 81.1 - 91.4 %   Platelets 230 140 - 400 Thousand/uL   MPV 10.6 7.5 - 12.5 fL   Neutro Abs 3,762 1,500 - 7,800 cells/uL   Lymphs Abs 1,471 850 - 3,900 cells/uL   Absolute Monocytes 439 200 - 950 cells/uL   Eosinophils Absolute 0 (L) 15 - 500 cells/uL   Basophils Absolute 29 0 - 200 cells/uL   Neutrophils Relative % 66 %   Total Lymphocyte 25.8 %   Monocytes Relative 7.7 %   Eosinophils Relative 0.0 %   Basophils Relative 0.5 %  Basic metabolic panel  Result Value Ref Range   Glucose, Bld 117 (H) 65 - 99 mg/dL   BUN 10 7 - 25 mg/dL   Creat 1.61 0.96 - 0.45 mg/dL   BUN/Creatinine Ratio SEE NOTE: 6 - 22 (calc)   Sodium 138 135 - 146 mmol/L   Potassium 3.7 3.5 - 5.3 mmol/L   Chloride 104 98 - 110 mmol/L   CO2 26 20 - 32 mmol/L   Calcium 9.4 8.6 - 10.4 mg/dL    Assessment and Plan  Cellulitis of left breast Assessment & Plan: Acute, significant improvement with most recent course of doxycycline.  Given this antibiotic seems to be  working the best for her we will continue a second course for an additional 10 days of antibiotics.  She has follow-up already scheduled with her primary care doctor.  No red flags noted for admission or IV antibiotics.  Return and ER precautions provided.  Orders: -     CBC with Differential/Platelet -     Basic metabolic panel  Bilateral pleural effusion Assessment & Plan: No evidence of progression on exam.  Orders: -     CBC with Differential/Platelet -     Basic metabolic panel  Other orders -     Potassium Chloride Crys ER; Take 2 tablets (40 mEq total) by mouth daily as needed. Take potasium days you take lasix.  Dispense: 30 tablet; Refill: 0 -     Doxycycline Hyclate; Take 1 tablet (100 mg total) by mouth 2 (two) times daily.  Dispense: 20 tablet; Refill: 0    No follow-ups on file.   Kerby Nora, MD

## 2022-11-06 ENCOUNTER — Encounter: Payer: Self-pay | Admitting: Internal Medicine

## 2022-11-06 ENCOUNTER — Ambulatory Visit (INDEPENDENT_AMBULATORY_CARE_PROVIDER_SITE_OTHER): Payer: Medicare HMO | Admitting: Internal Medicine

## 2022-11-06 VITALS — BP 130/78 | HR 85 | Temp 97.9°F | Ht 62.0 in | Wt 144.0 lb

## 2022-11-06 DIAGNOSIS — D509 Iron deficiency anemia, unspecified: Secondary | ICD-10-CM | POA: Diagnosis not present

## 2022-11-06 DIAGNOSIS — R609 Edema, unspecified: Secondary | ICD-10-CM

## 2022-11-06 DIAGNOSIS — N61 Mastitis without abscess: Secondary | ICD-10-CM | POA: Diagnosis not present

## 2022-11-06 DIAGNOSIS — I10 Essential (primary) hypertension: Secondary | ICD-10-CM | POA: Diagnosis not present

## 2022-11-06 MED ORDER — FUROSEMIDE 40 MG PO TABS: 40.0000 mg | ORAL_TABLET | Freq: Every day | ORAL | 11 refills | Status: DC

## 2022-11-06 NOTE — Patient Instructions (Signed)
Ok to increase the lasix to 40 mg per day  Please check your wt every AM and bring the results to your next visit  Please continue all other medications as before  Please have the pharmacy call with any other refills you may need.  Please keep your appointments with your specialists as you may have planned  Please make an Appointment to return in 1 week, or sooner if needed

## 2022-11-06 NOTE — Progress Notes (Signed)
Patient ID: Angelica [name], female   DOB: [birth year], 82 y.o.   MRN: [id number]        Chief Complaint: follow up volume increase, recent left breast cellulitis, htn, dementia       HPI:  Angelica [name] is a 82 y.o. female here with c/o wt increase and worsening swelling diffusely but worse to LUE, but Pt denies chest pain, increased sob or doe, wheezing, orthopnea, PND, palpitations, dizziness or syncope. Despite good compliance with 20 mg lasix post hosp d/c.  Did have echo with normal EF but indeterminate diast parameters.  Recent left breast cellulitis essentially resolved with good antibx compliance.  Dementia overall stable symptomatically, and not assoc with behavioral changes such as hallucinations, paranoia, or agitation.         Wt Readings from Last 3 Encounters:  11/06/22 144 lb (65.3 kg)  11/02/22 141 lb 12.8 oz (64.3 kg)  10/22/22 138 lb (62.6 kg)   BP Readings from Last 3 Encounters:  11/06/22 130/78  11/02/22 128/82  10/26/22 (!) 140/76         Past Medical History:  Diagnosis Date   Arthritis    Asthma    Bowel obstruction (HCC)    Cancer (HCC)    Environmental allergies    GERD (gastroesophageal reflux disease)    Glaucoma 05/09/2017   H. pylori infection    History of radiation therapy 07/05/20-08/05/20   Left lung IMRT Dr. Roselind Messier    HLD (hyperlipidemia) 01/16/2019   Hypertension    Iron deficiency anemia    Osteopenia 05/26/2017   Thyroid disease    Urticaria 07/25/2018   Past Surgical History:  Procedure Laterality Date   ABDOMINAL HYSTERECTOMY     BOWEL RESECTION N/A 05/23/2020   Procedure: SMALL BOWEL REPAIR;  Surgeon: Sheliah Hatch, De Blanch, MD;  Location: [facility] OR;  Service: General;  Laterality: N/A;   BREAST BIOPSY     BRONCHIAL BIOPSY  06/10/2020   Procedure: BRONCHIAL BIOPSIES;  Surgeon: [name], DO;  Location: [facility];  Service: Pulmonary;;   BRONCHIAL BRUSHINGS  06/10/2020   Procedure: BRONCHIAL BRUSHINGS;  Surgeon: [name], DO;   Location: [facility];  Service: Pulmonary;;   BRONCHIAL NEEDLE ASPIRATION BIOPSY  06/10/2020   Procedure: BRONCHIAL NEEDLE ASPIRATION BIOPSIES;  Surgeon: [name], DO;  Location: [facility];  Service: Pulmonary;;   BRONCHIAL WASHINGS  06/10/2020   Procedure: BRONCHIAL WASHINGS;  Surgeon: [name], DO;  Location: [facility];  Service: Pulmonary;;   COLON SURGERY     DIAGNOSTIC LARYNGOSCOPY N/A 05/23/2020   Procedure: ATTEMPTED DIAGNOSTIC LAPAROSCOPY WITH ADHESIONS;  Surgeon: Sheliah Hatch, De Blanch, MD;  Location: [facility] OR;  Service: General;  Laterality: N/A;   IR IMAGING GUIDED PORT INSERTION  07/15/2020   KNEE SURGERY     LAPAROTOMY N/A 04/18/2015   Procedure: Exploratory laparotomy with lysis of adhesions, possible bowel resection;  Surgeon: Axel Filler, MD;  Location: [facility] OR;  Service: General;  Laterality: N/A;   LAPAROTOMY N/A 05/23/2020   Procedure: EXPLORATORY LAPAROTOMY;  Surgeon: Rodman Pickle, MD;  Location: [facility] OR;  Service: General;  Laterality: N/A;   LYSIS OF ADHESION N/A 05/23/2020   Procedure: LYSIS OF ADHESION;  Surgeon: Sheliah Hatch De Blanch, MD;  Location: [facility] OR;  Service: General;  Laterality: N/A;   NASAL SINUS SURGERY     THYROID SURGERY     VIDEO BRONCHOSCOPY WITH ENDOBRONCHIAL NAVIGATION Bilateral 06/10/2020   Procedure: VIDEO BRONCHOSCOPY WITH ENDOBRONCHIAL NAVIGATION;  Surgeon: Tonia Brooms,  Rachel Bo, DO;  Location: [facility];  Service: Pulmonary;  Laterality: Bilateral;   VIDEO BRONCHOSCOPY WITH ENDOBRONCHIAL ULTRASOUND  06/10/2020   Procedure: VIDEO BRONCHOSCOPY WITH ENDOBRONCHIAL ULTRASOUND;  Surgeon: [name], DO;  Location: [facility];  Service: Pulmonary;;    reports that she has quit smoking. Her smoking use included cigarettes. She has never used smokeless tobacco. She reports that she does not drink alcohol and does not use drugs. family history includes Diabetes in her mother; Glaucoma in her brother and sister; Hypertension in her  father and mother. Allergies  Allergen Reactions   Asa Buff (Mag [Buffered Aspirin] Other (See Comments)    Excessive sweating   Ensure [Nutritional Supplements]     Or boost - upset stomach    Fish Allergy Other (See Comments)    Unknown reaction, but allergic   Other Other (See Comments)    Gelcaps; gel-containing capsules; extended-release medication - upset stomach    Peanut-Containing Drug Products Other (See Comments)    From allergy test   Penicillins Itching    Has patient had a PCN reaction causing immediate rash, facial/tongue/throat swelling, SOB or lightheadedness with hypotension: No Has patient had a PCN reaction causing severe rash involving mucus membranes or skin necrosis: No Has patient had a PCN reaction that required hospitalization No Has patient had a PCN reaction occurring within the last 10 years: No If all of the above answers are "NO", then may proceed with Cephalosporin use.   Shellfish-Derived Products Other (See Comments)    From allergy test   Strawberry Extract Other (See Comments)    From allergy test    Current Outpatient Medications on File Prior to Visit  Medication Sig Dispense Refill   acetaminophen (TYLENOL) 500 MG tablet Take 500 mg by mouth every 6 (six) hours as needed for moderate pain.     albuterol (VENTOLIN HFA) 108 (90 Base) MCG/ACT inhaler Inhale 2 puffs into the lungs every 4 (four) hours as needed for wheezing or shortness of breath. 18 g 1   alum & mag hydroxide-simeth (MAALOX/MYLANTA) 200-200-20 MG/5ML suspension Take 15 mLs by mouth every 6 (six) hours as needed for indigestion or heartburn.     carboxymethylcellulose (REFRESH PLUS) 0.5 % SOLN Place 1 drop into both eyes daily as needed (Uses as needed).     clopidogrel (PLAVIX) 75 MG tablet Take 1 tablet by mouth once daily 90 tablet 3   diclofenac Sodium (VOLTAREN) 1 % GEL Apply 4 g topically 4 (four) times daily. 100 g 0   doxycycline (VIBRA-TABS) 100 MG tablet Take 1 tablet (100  mg total) by mouth 2 (two) times daily. 20 tablet 0   EPINEPHrine (EPIPEN 2-PAK) 0.3 mg/0.3 mL IJ SOAJ injection Use as directed for severe allergic reaction 2 each 1   EQ ALLERGY RELIEF, CETIRIZINE, 10 MG tablet Take 1 tablet by mouth once daily 90 tablet 0   FeFum-FePoly-FA-B Cmp-C-Biot (INTEGRA PLUS) CAPS Take 1 capsule by mouth every morning. 30 capsule 2   fluticasone (FLONASE) 50 MCG/ACT nasal spray Place 1 spray into both nostrils in the morning and at bedtime. 16 g 5   folic acid (FOLVITE) 1 MG tablet Take 1 tablet (1 mg total) by mouth daily. 90 tablet 0   ipratropium (ATROVENT) 0.06 % nasal spray Place 2 sprays into both nostrils 4 (four) times daily as needed (nasal drainage). 15 mL 5   levothyroxine (SYNTHROID) 50 MCG tablet Take 1 tablet (50 mcg total) by mouth daily before breakfast.  10 tablet 0   Magnesium Hydroxide (PHILLIPS MILK OF MAGNESIA PO) Take 15-30 mLs by mouth daily as needed (for constipation).     memantine (NAMENDA) 10 MG tablet Take 1 tablet (10 mg total) by mouth 2 (two) times daily. 60 tablet 5   montelukast (SINGULAIR) 10 MG tablet Take 1 tablet (10 mg total) by mouth at bedtime. 30 tablet 5   ondansetron (ZOFRAN-ODT) 4 MG disintegrating tablet Take 1 tablet (4 mg total) by mouth every 8 (eight) hours as needed for nausea or vomiting. 10 tablet 0   pantoprazole (PROTONIX) 40 MG tablet Take 1 tablet (40 mg total) by mouth daily. 90 tablet 1   polyethylene glycol (MIRALAX / GLYCOLAX) 17 g packet Take 17 g by mouth daily as needed for mild constipation.     potassium chloride SA (KLOR-CON M) 20 MEQ tablet Take 2 tablets (40 mEq total) by mouth daily as needed. Take potasium days you take lasix. 30 tablet 0   pravastatin (PRAVACHOL) 80 MG tablet Take 1 tablet by mouth once daily 90 tablet 0   SYMBICORT 80-4.5 MCG/ACT inhaler INHALE 2 PUFFS BY MOUTH IN THE MORNING AND AT BEDTIME 10.2 g 5   Current Facility-Administered Medications on File Prior to Visit  Medication Dose  Route Frequency Provider Last Rate Last Admin   Benralizumab SOSY 30 mg  30 mg Subcutaneous Q8 Weeks Marcelyn Bruins, MD   30 mg at 10/17/22 1342        ROS:  All others reviewed and negative.  Objective        PE:  BP 130/78 (BP Location: Right Arm, Patient Position: Sitting, Cuff Size: Normal)   Pulse 85   Temp 97.9 F (36.6 C) (Oral)   Ht 5\' 2"  (1.575 m)   Wt 144 lb (65.3 kg)   SpO2 97%   BMI 26.34 kg/m                 Constitutional: Pt appears in NAD               HENT: Head: NCAT.                Right Ear: External ear normal.                 Left Ear: External ear normal.                Eyes: . Pupils are equal, round, and reactive to light. Conjunctivae and EOM are normal               Nose: without d/c or deformity               Neck: Neck supple. Gross normal ROM               Cardiovascular: Normal rate and regular rhythm.                 Pulmonary/Chest: Effort normal and breath sounds without rales or wheezing.                Abd:  Soft, NT, ND, + BS, no organomegaly               Neurological: Pt is alert. At baseline orientation, motor grossly intact               Skin: Skin is warm. No rashes, no other new lesions, LE edema - trace bilateral, LUE 1+ diffusely  Psychiatric: Pt behavior is normal without agitation   Micro: none  Cardiac tracings I have personally interpreted today:  none  Pertinent Radiological findings (summarize): none   Lab Results  Component Value Date   WBC 5.7 11/02/2022   HGB 12.5 11/02/2022   HCT 38.2 11/02/2022   PLT 230 11/02/2022   GLUCOSE 117 (H) 11/02/2022   CHOL 191 07/18/2021   TRIG 83.0 07/18/2021   HDL 77.50 07/18/2021   LDLCALC 97 07/18/2021   ALT 12 10/07/2022   AST 17 10/07/2022   NA 138 11/02/2022   K 3.7 11/02/2022   CL 104 11/02/2022   CREATININE 0.86 11/02/2022   BUN 10 11/02/2022   CO2 26 11/02/2022   TSH 3.632 10/05/2022   INR 1.0 11/02/2020   HGBA1C 5.8 07/18/2021    Assessment/Plan:  Angelica [name] is a 82 y.o. Black or African American [2] female with  has a past medical history of Arthritis, Asthma, Bowel obstruction (HCC), Cancer (HCC), Environmental allergies, GERD (gastroesophageal reflux disease), Glaucoma (05/09/2017), H. pylori infection, History of radiation therapy (07/05/20-08/05/20), HLD (hyperlipidemia) (01/16/2019), Hypertension, Iron deficiency anemia, Osteopenia (05/26/2017), Thyroid disease, and Urticaria (07/25/2018).  HTN (hypertension) BP Readings from Last 3 Encounters:  11/06/22 130/78  11/02/22 128/82  10/26/22 (!) 140/76   Stable, pt to continue diet, wt control, low salt   Cellulitis of left breast Resolved, to finish antbx as planned  Edema Etiology unclear, for increased lasix 40 every day, f/u 1 wk  Followup: Return in about 1 week (around 11/13/2022).  Oliver Barre, MD 11/10/2022 2:58 PM [facility]

## 2022-11-07 DIAGNOSIS — F0280 Dementia in other diseases classified elsewhere without behavioral disturbance: Secondary | ICD-10-CM | POA: Diagnosis not present

## 2022-11-07 DIAGNOSIS — C3492 Malignant neoplasm of unspecified part of left bronchus or lung: Secondary | ICD-10-CM | POA: Diagnosis not present

## 2022-11-07 DIAGNOSIS — J45909 Unspecified asthma, uncomplicated: Secondary | ICD-10-CM | POA: Diagnosis not present

## 2022-11-07 DIAGNOSIS — D509 Iron deficiency anemia, unspecified: Secondary | ICD-10-CM | POA: Diagnosis not present

## 2022-11-07 DIAGNOSIS — I10 Essential (primary) hypertension: Secondary | ICD-10-CM | POA: Diagnosis not present

## 2022-11-07 DIAGNOSIS — K219 Gastro-esophageal reflux disease without esophagitis: Secondary | ICD-10-CM | POA: Diagnosis not present

## 2022-11-07 DIAGNOSIS — J9601 Acute respiratory failure with hypoxia: Secondary | ICD-10-CM | POA: Diagnosis not present

## 2022-11-07 DIAGNOSIS — E785 Hyperlipidemia, unspecified: Secondary | ICD-10-CM | POA: Diagnosis not present

## 2022-11-07 DIAGNOSIS — E039 Hypothyroidism, unspecified: Secondary | ICD-10-CM | POA: Diagnosis not present

## 2022-11-07 NOTE — Assessment & Plan Note (Signed)
No evidence of progression on exam.

## 2022-11-07 NOTE — Assessment & Plan Note (Signed)
Acute, significant improvement with most recent course of doxycycline.  Given this antibiotic seems to be working the best for her we will continue a second course for an additional 10 days of antibiotics.  She has follow-up already scheduled with her primary care doctor.  No red flags noted for admission or IV antibiotics.  Return and ER precautions provided.

## 2022-11-10 ENCOUNTER — Other Ambulatory Visit: Payer: Self-pay | Admitting: Neurology

## 2022-11-10 ENCOUNTER — Encounter: Payer: Self-pay | Admitting: Internal Medicine

## 2022-11-10 NOTE — Assessment & Plan Note (Signed)
BP Readings from Last 3 Encounters:  11/06/22 130/78  11/02/22 128/82  10/26/22 (!) 140/76   Stable, pt to continue diet, wt control, low salt

## 2022-11-10 NOTE — Assessment & Plan Note (Signed)
Lab Results  Component Value Date   WBC 5.7 11/02/2022   HGB 12.5 11/02/2022   HCT 38.2 11/02/2022   MCV 87.4 11/02/2022   PLT 230 11/02/2022   Hgb much improved, not likely to contribute to third spacing issue,  to f/u any worsening symptoms or concerns

## 2022-11-10 NOTE — Assessment & Plan Note (Signed)
Resolved, to finish antbx as planned

## 2022-11-10 NOTE — Assessment & Plan Note (Signed)
Etiology unclear, for increased lasix 40 every day, f/u 1 wk

## 2022-11-14 ENCOUNTER — Ambulatory Visit (INDEPENDENT_AMBULATORY_CARE_PROVIDER_SITE_OTHER): Payer: Medicare HMO

## 2022-11-14 ENCOUNTER — Encounter: Payer: Self-pay | Admitting: Internal Medicine

## 2022-11-14 ENCOUNTER — Ambulatory Visit (INDEPENDENT_AMBULATORY_CARE_PROVIDER_SITE_OTHER): Payer: Medicare HMO | Admitting: Internal Medicine

## 2022-11-14 VITALS — BP 122/76 | HR 75 | Temp 97.8°F | Ht 62.0 in | Wt 141.0 lb

## 2022-11-14 DIAGNOSIS — R0602 Shortness of breath: Secondary | ICD-10-CM | POA: Diagnosis not present

## 2022-11-14 DIAGNOSIS — R0600 Dyspnea, unspecified: Secondary | ICD-10-CM | POA: Insufficient documentation

## 2022-11-14 DIAGNOSIS — I10 Essential (primary) hypertension: Secondary | ICD-10-CM

## 2022-11-14 DIAGNOSIS — N644 Mastodynia: Secondary | ICD-10-CM

## 2022-11-14 DIAGNOSIS — D509 Iron deficiency anemia, unspecified: Secondary | ICD-10-CM

## 2022-11-14 DIAGNOSIS — J90 Pleural effusion, not elsewhere classified: Secondary | ICD-10-CM | POA: Diagnosis not present

## 2022-11-14 DIAGNOSIS — N63 Unspecified lump in unspecified breast: Secondary | ICD-10-CM

## 2022-11-14 DIAGNOSIS — I700 Atherosclerosis of aorta: Secondary | ICD-10-CM | POA: Diagnosis not present

## 2022-11-14 LAB — BASIC METABOLIC PANEL
BUN: 9 mg/dL (ref 6–23)
CO2: 28 mEq/L (ref 19–32)
Calcium: 10 mg/dL (ref 8.4–10.5)
Chloride: 101 mEq/L (ref 96–112)
Creatinine, Ser: 0.81 mg/dL (ref 0.40–1.20)
GFR: 67.38 mL/min (ref >60.00)
Glucose, Bld: 93 mg/dL (ref 70–99)
Potassium: 3.9 mEq/L (ref 3.5–5.1)
Sodium: 136 mEq/L (ref 135–145)

## 2022-11-14 NOTE — Patient Instructions (Signed)
Please continue all other medications as before, and we can hold on further antibiotic for now  Please have the pharmacy call with any other refills you may need.  Please keep your appointments with your specialists as you may have planned  You will be contacted regarding the referral for: MRI left breast  Please go to the XRAY Department in the first floor for the x-ray testing  Please go to the LAB at the blood drawing area for the tests to be done  You will be contacted by phone if any changes need to be made immediately.  Otherwise, you will receive a letter about your results with an explanation, but please check with MyChart first.  Please remember to sign up for MyChart if you have not done so, as this will be important to you in the future with finding out test results, communicating by private email, and scheduling acute appointments online when needed.

## 2022-11-14 NOTE — Progress Notes (Signed)
The test results show that your current treatment is OK, as the tests are stable.  Please continue the same plan.  There is no other need for change of treatment or further evaluation based on these results, at this time.  thanks 

## 2022-11-14 NOTE — Progress Notes (Signed)
Patient ID: Angelica [name], female   DOB: 1940-06-02, 82 y.o.   MRN: [id number]        Chief Complaint: follow up worsening left breast enlargement and left arm enlarged again, htn, dyspnea, iron def anemia       HPI:  Angelica [name] is a 82 y.o. female here with good med compliance with recent lasix and finished antibx for breast cellultiis, now with overall worsening again of left breast and LUE diffusely for unclear reason.   Pt denies fever, wt loss, night sweats, loss of appetite, or other constitutional symptoms  Pt denies chest pain,, wheezing, orthopnea, PND, increased LE swelling, palpitations, dizziness or syncope, but has had increased sob it seems in the past wk with walking room to room at home as well.  No falls        Wt Readings from Last 3 Encounters:  11/14/22 141 lb (64 kg)  11/06/22 144 lb (65.3 kg)  11/02/22 141 lb 12.8 oz (64.3 kg)   BP Readings from Last 3 Encounters:  11/14/22 122/76  11/06/22 130/78  11/02/22 128/82         Past Medical History:  Diagnosis Date   Arthritis    Asthma    Bowel obstruction (HCC)    Cancer (HCC)    Environmental allergies    GERD (gastroesophageal reflux disease)    Glaucoma 05/09/2017   H. pylori infection    History of radiation therapy 07/05/20-08/05/20   Left lung IMRT Dr. Roselind Messier    HLD (hyperlipidemia) 01/16/2019   Hypertension    Iron deficiency anemia    Osteopenia 05/26/2017   Thyroid disease    Urticaria 07/25/2018   Past Surgical History:  Procedure Laterality Date   ABDOMINAL HYSTERECTOMY     BOWEL RESECTION N/A 05/23/2020   Procedure: SMALL BOWEL REPAIR;  Surgeon: Sheliah Hatch, De Blanch, MD;  Location: [facility];  Service: General;  Laterality: N/A;   BREAST BIOPSY     BRONCHIAL BIOPSY  06/10/2020   Procedure: BRONCHIAL BIOPSIES;  Surgeon: [name], DO;  Location: [facility];  Service: Pulmonary;;   BRONCHIAL BRUSHINGS  06/10/2020   Procedure: BRONCHIAL BRUSHINGS;  Surgeon: [name], DO;  Location:  [facility];  Service: Pulmonary;;   BRONCHIAL NEEDLE ASPIRATION BIOPSY  06/10/2020   Procedure: BRONCHIAL NEEDLE ASPIRATION BIOPSIES;  Surgeon: [name], DO;  Location: [facility];  Service: Pulmonary;;   BRONCHIAL WASHINGS  06/10/2020   Procedure: BRONCHIAL WASHINGS;  Surgeon: [name], DO;  Location: [facility];  Service: Pulmonary;;   COLON SURGERY     DIAGNOSTIC LARYNGOSCOPY N/A 05/23/2020   Procedure: ATTEMPTED DIAGNOSTIC LAPAROSCOPY WITH ADHESIONS;  Surgeon: Sheliah Hatch, De Blanch, MD;  Location: [facility];  Service: General;  Laterality: N/A;   IR IMAGING GUIDED PORT INSERTION  07/15/2020   KNEE SURGERY     LAPAROTOMY N/A 04/18/2015   Procedure: Exploratory laparotomy with lysis of adhesions, possible bowel resection;  Surgeon: Axel Filler, MD;  Location: [facility];  Service: General;  Laterality: N/A;   LAPAROTOMY N/A 05/23/2020   Procedure: EXPLORATORY LAPAROTOMY;  Surgeon: Rodman Pickle, MD;  Location: [facility];  Service: General;  Laterality: N/A;   LYSIS OF ADHESION N/A 05/23/2020   Procedure: LYSIS OF ADHESION;  Surgeon: Sheliah Hatch De Blanch, MD;  Location: [facility];  Service: General;  Laterality: N/A;   NASAL SINUS SURGERY     THYROID SURGERY     VIDEO BRONCHOSCOPY WITH ENDOBRONCHIAL NAVIGATION Bilateral 06/10/2020   Procedure:  VIDEO BRONCHOSCOPY WITH ENDOBRONCHIAL NAVIGATION;  Surgeon: [name], DO;  Location: [facility];  Service: Pulmonary;  Laterality: Bilateral;   VIDEO BRONCHOSCOPY WITH ENDOBRONCHIAL ULTRASOUND  06/10/2020   Procedure: VIDEO BRONCHOSCOPY WITH ENDOBRONCHIAL ULTRASOUND;  Surgeon: [name], DO;  Location: [facility];  Service: Pulmonary;;    reports that she has quit smoking. Her smoking use included cigarettes. She has never used smokeless tobacco. She reports that she does not drink alcohol and does not use drugs. family history includes Diabetes in her mother; Glaucoma in her brother and sister; Hypertension in her father and  mother. Allergies  Allergen Reactions   Asa Buff (Mag [Buffered Aspirin] Other (See Comments)    Excessive sweating   Ensure [Nutritional Supplements]     Or boost - upset stomach    Fish Allergy Other (See Comments)    Unknown reaction, but allergic   Other Other (See Comments)    Gelcaps; gel-containing capsules; extended-release medication - upset stomach    Peanut-Containing Drug Products Other (See Comments)    From allergy test   Penicillins Itching    Has patient had a PCN reaction causing immediate rash, facial/tongue/throat swelling, SOB or lightheadedness with hypotension: No Has patient had a PCN reaction causing severe rash involving mucus membranes or skin necrosis: No Has patient had a PCN reaction that required hospitalization No Has patient had a PCN reaction occurring within the last 10 years: No If all of the above answers are "NO", then may proceed with Cephalosporin use.   Shellfish-Derived Products Other (See Comments)    From allergy test   Strawberry Extract Other (See Comments)    From allergy test    Current Outpatient Medications on File Prior to Visit  Medication Sig Dispense Refill   acetaminophen (TYLENOL) 500 MG tablet Take 500 mg by mouth every 6 (six) hours as needed for moderate pain.     albuterol (VENTOLIN HFA) 108 (90 Base) MCG/ACT inhaler Inhale 2 puffs into the lungs every 4 (four) hours as needed for wheezing or shortness of breath. 18 g 1   alum & mag hydroxide-simeth (MAALOX/MYLANTA) 200-200-20 MG/5ML suspension Take 15 mLs by mouth every 6 (six) hours as needed for indigestion or heartburn.     carboxymethylcellulose (REFRESH PLUS) 0.5 % SOLN Place 1 drop into both eyes daily as needed (Uses as needed).     clopidogrel (PLAVIX) 75 MG tablet Take 1 tablet by mouth once daily 90 tablet 3   diclofenac Sodium (VOLTAREN) 1 % GEL Apply 4 g topically 4 (four) times daily. 100 g 0   doxycycline (VIBRA-TABS) 100 MG tablet Take 1 tablet (100 mg total)  by mouth 2 (two) times daily. 20 tablet 0   EPINEPHrine (EPIPEN 2-PAK) 0.3 mg/0.3 mL IJ SOAJ injection Use as directed for severe allergic reaction 2 each 1   EQ ALLERGY RELIEF, CETIRIZINE, 10 MG tablet Take 1 tablet by mouth once daily 90 tablet 0   FeFum-FePoly-FA-B Cmp-C-Biot (INTEGRA PLUS) CAPS Take 1 capsule by mouth every morning. 30 capsule 2   fluticasone (FLONASE) 50 MCG/ACT nasal spray Place 1 spray into both nostrils in the morning and at bedtime. 16 g 5   folic acid (FOLVITE) 1 MG tablet Take 1 tablet (1 mg total) by mouth daily. 90 tablet 0   furosemide (LASIX) 40 MG tablet Take 1 tablet (40 mg total) by mouth daily. 30 tablet 11   ipratropium (ATROVENT) 0.06 % nasal spray Place 2 sprays into both nostrils 4 (four)  times daily as needed (nasal drainage). 15 mL 5   levothyroxine (SYNTHROID) 50 MCG tablet Take 1 tablet (50 mcg total) by mouth daily before breakfast. 10 tablet 0   Magnesium Hydroxide (PHILLIPS MILK OF MAGNESIA PO) Take 15-30 mLs by mouth daily as needed (for constipation).     memantine (NAMENDA) 10 MG tablet Take 1 tablet by mouth twice daily 60 tablet 3   montelukast (SINGULAIR) 10 MG tablet Take 1 tablet (10 mg total) by mouth at bedtime. 30 tablet 5   ondansetron (ZOFRAN-ODT) 4 MG disintegrating tablet Take 1 tablet (4 mg total) by mouth every 8 (eight) hours as needed for nausea or vomiting. 10 tablet 0   pantoprazole (PROTONIX) 40 MG tablet Take 1 tablet (40 mg total) by mouth daily. 90 tablet 1   polyethylene glycol (MIRALAX / GLYCOLAX) 17 g packet Take 17 g by mouth daily as needed for mild constipation.     potassium chloride SA (KLOR-CON M) 20 MEQ tablet Take 2 tablets (40 mEq total) by mouth daily as needed. Take potasium days you take lasix. 30 tablet 0   pravastatin (PRAVACHOL) 80 MG tablet Take 1 tablet by mouth once daily 90 tablet 0   SYMBICORT 80-4.5 MCG/ACT inhaler INHALE 2 PUFFS BY MOUTH IN THE MORNING AND AT BEDTIME 10.2 g 5   Current  Facility-Administered Medications on File Prior to Visit  Medication Dose Route Frequency Provider Last Rate Last Admin   Benralizumab SOSY 30 mg  30 mg Subcutaneous Q8 Weeks Marcelyn Bruins, MD   30 mg at 10/17/22 1342        ROS:  All others reviewed and negative.  Objective        PE:  BP 122/76 (BP Location: Right Arm, Patient Position: Sitting, Cuff Size: Normal)   Pulse 75   Temp 97.8 F (36.6 C) (Oral)   Ht 5\' 2"  (1.575 m)   Wt 141 lb (64 kg)   SpO2 98%   BMI 25.79 kg/m                 Constitutional: Pt appears in NAD               HENT: Head: NCAT.                Right Ear: External ear normal.                 Left Ear: External ear normal.                Eyes: . Pupils are equal, round, and reactive to light. Conjunctivae and EOM are normal               Nose: without d/c or deformity               Neck: Neck supple. Gross normal ROM               Cardiovascular: Normal rate and regular rhythm.                 Pulmonary/Chest: Effort normal and breath sounds without rales or wheezing.                Abd:  Soft, NT, ND, + BS, no organomegaly               Neurological: Pt is alert. At baseline orientation, motor grossly intact               Skin: Skin  is warm. No rashes, no other new lesions, LE edema - none; but has moderate left breast enlargement and LUE doughy edema swelling diffusely as well.                 Psychiatric: Pt behavior is normal without agitation   Micro: none  Cardiac tracings I have personally interpreted today:  none  Pertinent Radiological findings (summarize): none   Lab Results  Component Value Date   WBC 5.7 11/02/2022   HGB 12.5 11/02/2022   HCT 38.2 11/02/2022   PLT 230 11/02/2022   GLUCOSE 93 11/14/2022   CHOL 191 07/18/2021   TRIG 83.0 07/18/2021   HDL 77.50 07/18/2021   LDLCALC 97 07/18/2021   ALT 12 10/07/2022   AST 17 10/07/2022   NA 136 11/14/2022   K 3.9 11/14/2022   CL 101 11/14/2022   CREATININE 0.81  11/14/2022   BUN 9 11/14/2022   CO2 28 11/14/2022   TSH 3.632 10/05/2022   INR 1.0 11/02/2020   HGBA1C 5.8 07/18/2021   Assessment/Plan:  SONAKSHI CHAMBERS is a 81 y.o. Black or African American [2] female with  has a past medical history of Arthritis, Asthma, Bowel obstruction (HCC), Cancer (HCC), Environmental allergies, GERD (gastroesophageal reflux disease), Glaucoma (05/09/2017), H. pylori infection, History of radiation therapy (07/05/20-08/05/20), HLD (hyperlipidemia) (01/16/2019), Hypertension, Iron deficiency anemia, Osteopenia (05/26/2017), Thyroid disease, and Urticaria (07/25/2018).  HTN (hypertension) BP Readings from Last 3 Encounters:  11/14/22 122/76  11/06/22 130/78  11/02/22 128/82   Stable, pt to continue medical treatment - diet,wt control   Dyspnea Pt with ok sat, non toxic and NAD today, has hx of pleural effusion -for f/u cxr today  Iron deficiency anemia S/p planned IV iron x 3, for f/u cbc  Swelling of breast Quite unusual presentation with breast and left arm swelling worsening again despite current lasix and finished recent antibx course for cellulitis - Recent CT without obvious LN involvement; ok for MRI bilateral breast and may need oncology f/u as well  Followup: No follow-ups on file.  Oliver Barre, MD 11/17/2022 5:37 PM [facility] - [facility]

## 2022-11-16 ENCOUNTER — Telehealth: Payer: Self-pay | Admitting: Internal Medicine

## 2022-11-16 NOTE — Telephone Encounter (Signed)
MRI orders need to be changed to BILATERAL; left is incorrect    Carron Brazen [facility]- [phone]

## 2022-11-17 ENCOUNTER — Encounter: Payer: Self-pay | Admitting: Internal Medicine

## 2022-11-17 DIAGNOSIS — N63 Unspecified lump in unspecified breast: Secondary | ICD-10-CM | POA: Insufficient documentation

## 2022-11-17 NOTE — Assessment & Plan Note (Signed)
Quite unusual presentation with breast and left arm swelling worsening again despite current lasix and finished recent antibx course for cellulitis - Recent CT without obvious LN involvement; ok for MRI bilateral breast and may need oncology f/u as well

## 2022-11-17 NOTE — Assessment & Plan Note (Signed)
S/p planned IV iron x 3, for f/u cbc

## 2022-11-17 NOTE — Assessment & Plan Note (Signed)
Pt with ok sat, non toxic and NAD today, has hx of pleural effusion -for f/u cxr today

## 2022-11-17 NOTE — Assessment & Plan Note (Signed)
BP Readings from Last 3 Encounters:  11/14/22 122/76  11/06/22 130/78  11/02/22 128/82   Stable, pt to continue medical treatment - diet,wt control

## 2022-11-20 ENCOUNTER — Other Ambulatory Visit: Payer: Self-pay | Admitting: Internal Medicine

## 2022-11-20 DIAGNOSIS — J90 Pleural effusion, not elsewhere classified: Secondary | ICD-10-CM

## 2022-11-21 ENCOUNTER — Telehealth (INDEPENDENT_AMBULATORY_CARE_PROVIDER_SITE_OTHER): Payer: Medicare HMO | Admitting: Pulmonary Disease

## 2022-11-21 DIAGNOSIS — E039 Hypothyroidism, unspecified: Secondary | ICD-10-CM | POA: Diagnosis not present

## 2022-11-21 DIAGNOSIS — K219 Gastro-esophageal reflux disease without esophagitis: Secondary | ICD-10-CM | POA: Diagnosis not present

## 2022-11-21 DIAGNOSIS — D509 Iron deficiency anemia, unspecified: Secondary | ICD-10-CM | POA: Diagnosis not present

## 2022-11-21 DIAGNOSIS — J9601 Acute respiratory failure with hypoxia: Secondary | ICD-10-CM | POA: Diagnosis not present

## 2022-11-21 DIAGNOSIS — J45909 Unspecified asthma, uncomplicated: Secondary | ICD-10-CM | POA: Diagnosis not present

## 2022-11-21 DIAGNOSIS — C3492 Malignant neoplasm of unspecified part of left bronchus or lung: Secondary | ICD-10-CM | POA: Diagnosis not present

## 2022-11-21 DIAGNOSIS — E785 Hyperlipidemia, unspecified: Secondary | ICD-10-CM | POA: Diagnosis not present

## 2022-11-21 DIAGNOSIS — F0280 Dementia in other diseases classified elsewhere without behavioral disturbance: Secondary | ICD-10-CM | POA: Diagnosis not present

## 2022-11-21 DIAGNOSIS — J90 Pleural effusion, not elsewhere classified: Secondary | ICD-10-CM | POA: Diagnosis not present

## 2022-11-21 DIAGNOSIS — I10 Essential (primary) hypertension: Secondary | ICD-10-CM | POA: Diagnosis not present

## 2022-11-21 NOTE — Progress Notes (Signed)
Virtual Visit via Video Note  I connected with Angelica [name] on 11/21/22 at  9:30 AM EDT by a video enabled telemedicine application and verified that I am speaking with the correct person using two identifiers.  Location: Patient: Home Provider: Office   I discussed the limitations of evaluation and management by telemedicine and the availability of in person appointments. The patient expressed understanding and agreed to proceed.  History of Present Illness:  This is an 82 year old female with a past medical history of bowel obstruction, hyperlipidemia, hypertension, thyroid disease, asthma, allergies on Fasenra [facility].  Patient had a recent hospitalization, found to be COVID-positive, both vaccines plus booster.  She had a small bowel obstruction with abdominal pain nausea vomiting, incidental COVID infection.  Seen by general surgery underwent laparoscopy with lysis of adhesions during hospitalization had to be started on TPN.  Ultimately was discharged home.  During this evaluation patient found to have a left-sided lower lobe lung mass with associated mediastinal adenopathy.  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  Patient was referred at the time of discharge from the hospital for evaluation by pulmonary medicine for consideration of tissue diagnosis and biopsy after recovery from COVID-19.  At this time patient has no significant respiratory complaints has been doing well at home.  Patient accompanied today in the office by patient's daughter.  They are in agreements for consideration for tissue biopsy and bronchoscopy.  Patient denies fevers chills, denies hemoptysis.  She has been losing weight she does have poor appetite.   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  She has severe asthma at baseline on Symbicort plus Fasenra.  Respiratory status at this time relatively stable.  Bronchoscopy went well.  She has completed her chemotherapy cycle plus radiation.  Office note from 08/08/2020, Dr. Shirline Frees reviewed today in clinic.  Patient's weight has been stable.  Eating well.    OV 03/16/2021: Doing well at this time.  No significant respiratory changes.  Follows with Dr. Shirline Frees.  Had recent CT imaging that revealed improvement in the lower lobe mass.  Not on immunotherapy at this time after admission for infection.  No significant breathing complaints today.   OV 05/03/2022: Here today for follow-up regarding asthma.  Currently on Symbicort plus Fasenra.  Has follow-up imaging already completed with Dr. Shirline Frees for medical oncology.  He sees her in a few weeks.  Still using her Flonase for nasal symptoms which is significantly improved.  Otherwise she is trying to stay active.  OV 11/21/2022: patient known well to me. Recent hospitalization in May. Had thora during that stay 450cc removed. Cytology was negative. Follow up with PCP and repeat cXR demonstrated reaccumulation and she was referred back for repeat thora.    Observations/Objective:  Elderly FM, resting, NAD  Daughter present   Assessment and Plan:  History of Lung Cancer  Now with recurrent left sided effusion  P: Hold plavix  Last dose July 10th  She has several appts next week in the afternoon already  So, I will schedule thora with Dr. Katrinka Blazing on Monday 15th in Endo    Follow Up Instructions:  I discussed the assessment and treatment plan with the patient. The patient was provided an opportunity to ask questions and all  were answered. The patient agreed with the plan and demonstrated an understanding of the instructions.   The patient was advised to call back or seek an in-person evaluation if the symptoms worsen or if the  condition fails to improve as anticipated.  I provided 22 minutes of non-face-to-face time during this encounter.   Angelica Igo, DO

## 2022-11-29 ENCOUNTER — Inpatient Hospital Stay: Payer: Medicare HMO | Attending: Internal Medicine

## 2022-11-29 ENCOUNTER — Ambulatory Visit
Admission: RE | Admit: 2022-11-29 | Discharge: 2022-11-29 | Disposition: A | Discharge status: Home / Self Care | Payer: Medicare HMO | Source: Ambulatory Visit | Attending: Internal Medicine | Admitting: Internal Medicine

## 2022-11-29 ENCOUNTER — Other Ambulatory Visit: Payer: Self-pay

## 2022-11-29 DIAGNOSIS — Z95828 Presence of other vascular implants and grafts: Secondary | ICD-10-CM

## 2022-11-29 DIAGNOSIS — C3432 Malignant neoplasm of lower lobe, left bronchus or lung: Secondary | ICD-10-CM | POA: Diagnosis not present

## 2022-11-29 DIAGNOSIS — Z1239 Encounter for other screening for malignant neoplasm of breast: Secondary | ICD-10-CM | POA: Diagnosis not present

## 2022-11-29 DIAGNOSIS — N644 Mastodynia: Secondary | ICD-10-CM

## 2022-11-29 DIAGNOSIS — N63 Unspecified lump in unspecified breast: Secondary | ICD-10-CM

## 2022-11-29 MED ORDER — HEPARIN SOD (PORK) LOCK FLUSH 100 UNIT/ML IV SOLN
500.0000 [IU] | INTRAVENOUS | Status: AC | PRN
  Administered 2022-11-29: 500 [IU]

## 2022-11-29 MED ORDER — SODIUM CHLORIDE 0.9% FLUSH
10.0000 mL | INTRAVENOUS | Status: AC | PRN
  Administered 2022-11-29: 10 mL

## 2022-11-29 MED ORDER — GADOPICLENOL 0.5 MMOL/ML IV SOLN
6.0000 mL | Freq: Once | INTRAVENOUS | Status: AC | PRN
  Administered 2022-11-29: 6 mL via INTRAVENOUS

## 2022-12-03 ENCOUNTER — Ambulatory Visit (HOSPITAL_COMMUNITY)
Admission: RE | Admit: 2022-12-03 | Discharge: 2022-12-03 | Disposition: A | Discharge status: Home / Self Care | Payer: Medicare HMO | Attending: Internal Medicine | Admitting: Internal Medicine

## 2022-12-03 ENCOUNTER — Ambulatory Visit (HOSPITAL_COMMUNITY): Payer: Medicare HMO

## 2022-12-03 ENCOUNTER — Encounter (HOSPITAL_COMMUNITY)
Admission: RE | Disposition: A | Discharge status: Home / Self Care | Payer: Self-pay | Source: Home / Self Care | Attending: Internal Medicine

## 2022-12-03 ENCOUNTER — Ambulatory Visit: Payer: Medicare HMO | Admitting: Internal Medicine

## 2022-12-03 DIAGNOSIS — R918 Other nonspecific abnormal finding of lung field: Secondary | ICD-10-CM | POA: Diagnosis not present

## 2022-12-03 DIAGNOSIS — J90 Pleural effusion, not elsewhere classified: Secondary | ICD-10-CM | POA: Diagnosis not present

## 2022-12-03 DIAGNOSIS — Z85118 Personal history of other malignant neoplasm of bronchus and lung: Secondary | ICD-10-CM | POA: Diagnosis not present

## 2022-12-03 HISTORY — PX: THORACENTESIS: SHX235

## 2022-12-03 LAB — BODY FLUID CELL COUNT WITH DIFFERENTIAL
Eos, Fluid: 0 %
Lymphs, Fluid: 92 %
Monocyte-Macrophage-Serous Fluid: 7 % — ABNORMAL LOW (ref 50–90)
Neutrophil Count, Fluid: 1 % (ref 0–25)
Total Nucleated Cell Count, Fluid: 1820 cu mm — ABNORMAL HIGH (ref 0–1000)

## 2022-12-03 LAB — GRAM STAIN

## 2022-12-03 LAB — ALBUMIN, PLEURAL OR PERITONEAL FLUID: Albumin, Fluid: 2 g/dL

## 2022-12-03 LAB — LACTATE DEHYDROGENASE, PLEURAL OR PERITONEAL FLUID: LD, Fluid: 91 U/L — ABNORMAL HIGH (ref 3–23)

## 2022-12-03 LAB — PROTEIN, PLEURAL OR PERITONEAL FLUID: Total protein, fluid: 3.3 g/dL

## 2022-12-03 SURGERY — THORACENTESIS
Anesthesia: Topical | Laterality: Left

## 2022-12-03 NOTE — Op Note (Signed)
Thoracentesis  Procedure Note  Angelica [name]  [id number]  21-Dec-1940  Date:12/03/22  Time:1:46 PM   Provider Performing:Kearah Gayden L Rolan Wrightsman   Procedure: Thoracentesis with imaging guidance (29528)  Indication(s) Pleural Effusion  Consent Risks of the procedure as well as the alternatives and risks of each were explained to the patient and/or caregiver.  Consent for the procedure was obtained and is signed in the bedside chart  Anesthesia Topical only with 1% lidocaine   Time Out Verified patient identification, verified procedure, site/side was marked, verified correct patient position, special equipment/implants available, medications/allergies/relevant history reviewed, required imaging and test results available.  Sterile Technique Maximal sterile technique including full sterile barrier drape, hand hygiene, sterile gown, sterile gloves, mask, hair covering, sterile ultrasound probe cover (if used).  Procedure Description Ultrasound was used to identify appropriate pleural anatomy for placement and overlying skin marked.  Area of drainage cleaned and draped in sterile fashion. Lidocaine was used to anesthetize the skin and subcutaneous tissue.  450 cc's of yellow appearing fluid was drained from the left pleural space. Catheter then removed and bandaid applied to site.  Complications/Tolerance None; patient tolerated the procedure well. Chest X-ray is ordered to confirm no post-procedural complication.  EBL Minimal  Specimen(s) Pleural fluid  [name], DO [facility] 12/03/2022 1:46 PM

## 2022-12-03 NOTE — H&P (Signed)
Subjective:    PATIENT ID: Angelica [name] GENDER: female DOB: 03/12/1941, MRN: [id number]      Chief Complaint  Patient presents with   Follow-up      This is an 82 year old female with a past medical history of bowel obstruction, hyperlipidemia, hypertension, thyroid disease, asthma, allergies on Fasenra [facility] by [facility].  Patient had a recent hospitalization, found to be COVID-positive, both vaccines plus booster.  She had a small bowel obstruction with abdominal pain nausea vomiting, incidental COVID infection.  Seen by general surgery underwent laparoscopy with lysis of adhesions during hospitalization had to be started on TPN.  Ultimately was discharged home.  During this evaluation patient found to have a left-sided lower lobe lung mass with associated mediastinal adenopathy.  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  Patient was referred at the time of discharge from the hospital for evaluation by pulmonary medicine for consideration of tissue diagnosis and biopsy after recovery from COVID-19.  At this time patient has no significant respiratory complaints has been doing well at home.  Patient accompanied today in the office by patient's daughter.  They are in agreements for consideration for tissue biopsy and bronchoscopy.  Patient denies fevers chills, denies hemoptysis.  She has been losing weight she does have poor appetite.   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  She has severe asthma at baseline on Symbicort plus Fasenra.  Respiratory status at this time relatively stable.  Bronchoscopy went well.  She has completed her chemotherapy cycle plus radiation.  Office note from 08/08/2020, Dr. Shirline Frees reviewed today in clinic.   Patient's weight has been stable.  Eating well.    OV 03/16/2021: Doing well at this time.  No significant respiratory changes.  Follows with Dr. Shirline Frees.  Had recent CT imaging that revealed improvement in the lower lobe mass.  Not on immunotherapy at this time after admission for infection.  No significant breathing complaints today.   OV 05/03/2022: Here today for follow-up regarding asthma.  Currently on Symbicort plus Fasenra.  Has follow-up imaging already completed with Dr. Shirline Frees for medical oncology.  He sees her in a few weeks.  Still using her Flonase for nasal symptoms which is significantly improved.  Otherwise she is trying to stay active.   OV 11/21/2022: patient known well to me. Recent hospitalization in May. Had thora during that stay 450cc removed. Cytology was negative. Follow up with PCP and repeat cXR demonstrated reaccumulation and she was referred back for repeat thora.          Past Medical History:  Diagnosis Date   Arthritis     Asthma     Bowel obstruction (HCC)     Cancer (HCC)     Environmental allergies     GERD (gastroesophageal reflux disease)     Glaucoma 05/09/2017   H. pylori infection     History of radiation therapy 07/05/20-08/05/20    Left lung IMRT Dr. Roselind Messier    HLD (hyperlipidemia) 01/16/2019   Hypertension     Iron deficiency anemia     Osteopenia 05/26/2017   Thyroid disease     Urticaria 07/25/2018               Family History  Problem Relation Age of  Onset   Diabetes Mother     Hypertension Mother     Hypertension Father     Glaucoma Sister     Glaucoma Brother     Allergic rhinitis Neg Hx     Angioedema Neg Hx     Asthma Neg Hx     Eczema Neg Hx     Immunodeficiency Neg Hx     Urticaria Neg Hx                 Past Surgical History:  Procedure Laterality Date   ABDOMINAL HYSTERECTOMY       BOWEL RESECTION N/A 05/23/2020    Procedure: SMALL BOWEL REPAIR;  Surgeon: Kinsinger, De Blanch, MD;  Location: [facility] OR;  Service:  General;  Laterality: N/A;   BREAST BIOPSY       BRONCHIAL BIOPSY   06/10/2020    Procedure: BRONCHIAL BIOPSIES;  Surgeon: [name], DO;  Location: [facility];  Service: Pulmonary;;   BRONCHIAL BRUSHINGS   06/10/2020    Procedure: BRONCHIAL BRUSHINGS;  Surgeon: [name], DO;  Location: [facility];  Service: Pulmonary;;   BRONCHIAL NEEDLE ASPIRATION BIOPSY   06/10/2020    Procedure: BRONCHIAL NEEDLE ASPIRATION BIOPSIES;  Surgeon: [name], DO;  Location: [facility];  Service: Pulmonary;;   BRONCHIAL WASHINGS   06/10/2020    Procedure: BRONCHIAL WASHINGS;  Surgeon: [name], DO;  Location: [facility];  Service: Pulmonary;;   COLON SURGERY       DIAGNOSTIC LARYNGOSCOPY N/A 05/23/2020    Procedure: ATTEMPTED DIAGNOSTIC LAPAROSCOPY WITH ADHESIONS;  Surgeon: Sheliah Hatch, De Blanch, MD;  Location: [facility];  Service: General;  Laterality: N/A;   IR IMAGING GUIDED PORT INSERTION   07/15/2020   KNEE SURGERY       LAPAROTOMY N/A 04/18/2015    Procedure: Exploratory laparotomy with lysis of adhesions, possible bowel resection;  Surgeon: Axel Filler, MD;  Location: [facility];  Service: General;  Laterality: N/A;   LAPAROTOMY N/A 05/23/2020    Procedure: EXPLORATORY LAPAROTOMY;  Surgeon: Rodman Pickle, MD;  Location: [facility];  Service: General;  Laterality: N/A;   LYSIS OF ADHESION N/A 05/23/2020    Procedure: LYSIS OF ADHESION;  Surgeon: Sheliah Hatch De Blanch, MD;  Location: [facility];  Service: General;  Laterality: N/A;   NASAL SINUS SURGERY       THYROID SURGERY       VIDEO BRONCHOSCOPY WITH ENDOBRONCHIAL NAVIGATION Bilateral 06/10/2020    Procedure: VIDEO BRONCHOSCOPY WITH ENDOBRONCHIAL NAVIGATION;  Surgeon: [name], DO;  Location: [facility];  Service: Pulmonary;  Laterality: Bilateral;   VIDEO BRONCHOSCOPY WITH ENDOBRONCHIAL ULTRASOUND   06/10/2020    Procedure: VIDEO BRONCHOSCOPY WITH ENDOBRONCHIAL ULTRASOUND;  Surgeon: [name], DO;  Location: [facility];  Service: Pulmonary;;          Social History         Socioeconomic History   Marital status: Widowed      Spouse name: Not on file   Number of children: 4   Years of education: 9   Highest education level: Not on file  Occupational History   Occupation: retired  Tobacco Use   Smoking status: Former      Types: Cigarettes   Smokeless tobacco: Never   Tobacco comments:      quit 60 years ago  Vaping Use   Vaping Use: Never used  Substance and Sexual Activity   Alcohol use: No   Drug use: No  Sexual activity: Never  Other Topics Concern   Not on file  Social History Narrative    Right handed    Caffeine on occ    One story home    Social Determinants of Health        Financial Resource Strain: Low Risk  (04/25/2021)    Overall Financial Resource Strain (CARDIA)     Difficulty of Paying Living Expenses: Not hard at all  Food Insecurity: No Food Insecurity (11/16/2021)    Hunger Vital Sign     Worried About Running Out of Food in the Last Year: Never true     Ran Out of Food in the Last Year: Never true  Transportation Needs: No Transportation Needs (11/16/2021)    PRAPARE - Therapist, art (Medical): No     Lack of Transportation (Non-Medical): No  Physical Activity: Unknown (04/25/2021)    Exercise Vital Sign     Days of Exercise per Week: 0 days     Minutes of Exercise per Session: Not on file  Recent Concern: Physical Activity - Inactive (04/25/2021)    Exercise Vital Sign     Days of Exercise per Week: 0 days     Minutes of Exercise per Session: 30 min  Stress: No Stress Concern Present (04/25/2021)    [employer] of Occupational Health - Occupational Stress Questionnaire     Feeling of Stress : Not at all  Social Connections: Moderately Isolated (04/25/2021)    Social Connection and Isolation Panel [NHANES]     Frequency of Communication with Friends and Family: More than three times a week     Frequency of Social  Gatherings with Friends and Family: Twice a week     Attends Religious Services: More than 4 times per year     Active Member of [employer] or Organizations: No     Attends Banker Meetings: Not on file     Marital Status: Widowed  Intimate Partner Violence: Not At Risk (02/27/2021)    Humiliation, Afraid, Rape, and Kick questionnaire     Fear of Current or Ex-Partner: No     Emotionally Abused: No     Physically Abused: No     Sexually Abused: No      Allergies       Allergies  Allergen Reactions   Asa Buff (Mag [Buffered Aspirin] Other (See Comments)      Excessive sweating   Ensure [Nutritional Supplements]        Or boost - upset stomach    Fish Allergy Other (See Comments)      Unknown reaction, but allergic   Other Other (See Comments)      Gelcaps; gel-containing capsules; extended-release medication - upset stomach    Peanut-Containing Drug Products Other (See Comments)      From allergy test   Penicillins Itching      Has patient had a PCN reaction causing immediate rash, facial/tongue/throat swelling, SOB or lightheadedness with hypotension: No Has patient had a PCN reaction causing severe rash involving mucus membranes or skin necrosis: No Has patient had a PCN reaction that required hospitalization No Has patient had a PCN reaction occurring within the last 10 years: No If all of the above answers are "NO", then may proceed with Cephalosporin use.   Shellfish-Derived Products Other (See Comments)      From allergy test   Strawberry Extract Other (See Comments)      From allergy test  Outpatient Medications Prior to Visit  Medication Sig Dispense Refill   cetirizine (ZYRTEC ALLERGY) 10 MG tablet Take 1 tablet (10 mg total) by mouth daily. 90 tablet 1   fluticasone (FLONASE) 50 MCG/ACT nasal spray Place 1 spray into both nostrils in the morning and at bedtime. 16 g 5   ipratropium (ATROVENT) 0.06 % nasal spray Place 2 sprays into both  nostrils 4 (four) times daily as needed (nasal drainage). 15 mL 5   SYMBICORT 80-4.5 MCG/ACT inhaler INHALE 2 PUFFS BY MOUTH IN THE MORNING AND AT BEDTIME 10.2 g 5   acetaminophen (TYLENOL) 500 MG tablet Take 500 mg by mouth every 6 (six) hours as needed for moderate pain.       alum & mag hydroxide-simeth (MAALOX/MYLANTA) 200-200-20 MG/5ML suspension Take 15 mLs by mouth every 6 (six) hours as needed for indigestion or heartburn.       amLODipine (NORVASC) 10 MG tablet Take 1 tablet (10 mg total) by mouth daily. 90 tablet 3   Benralizumab (FASENRA) 30 MG/ML SOSY Inject 1 mL (30 mg total) into the skin every 8 (eight) weeks. 1 mL 6   clopidogrel (PLAVIX) 75 MG tablet Take 1 tablet (75 mg total) by mouth daily. 90 tablet 3   diclofenac Sodium (VOLTAREN) 1 % GEL Apply 4 g topically 4 (four) times daily. 100 g 0   EPINEPHrine (EPIPEN 2-PAK) 0.3 mg/0.3 mL IJ SOAJ injection Use as directed for severe allergic reaction 2 each 1   levothyroxine (SYNTHROID) 50 MCG tablet Take 1 tablet (50 mcg total) by mouth daily before breakfast. 90 tablet 3   linaclotide (LINZESS) 72 MCG capsule TAKE 1 CAPSULE BY MOUTH ONCE DAILY AS NEEDED 30 capsule 0   Magnesium Hydroxide (PHILLIPS MILK OF MAGNESIA PO) Take 15-30 mLs by mouth daily as needed (for constipation).       meclizine (ANTIVERT) 25 MG tablet Take 1 tablet (25 mg total) by mouth 2 (two) times daily as needed for dizziness. 10 tablet 0   memantine (NAMENDA) 10 MG tablet Take 1 tablet (10 mg total) by mouth 2 (two) times daily. 60 tablet 5   ondansetron (ZOFRAN) 8 MG tablet Take 1 tablet (8 mg total) by mouth every 8 (eight) hours as needed for nausea or vomiting. 20 tablet 0   polyethylene glycol (MIRALAX / GLYCOLAX) 17 g packet Take 17 g by mouth daily as needed for mild constipation.       pravastatin (PRAVACHOL) 80 MG tablet Take 1 tablet by mouth once daily 90 tablet 3   traMADol (ULTRAM) 50 MG tablet Take 50 mg by mouth every 6 (six) hours as needed for  moderate pain.                   Facility-Administered Medications Prior to Visit  Medication Dose Route Frequency Provider Last Rate Last Admin   Benralizumab SOSY 30 mg  30 mg Subcutaneous Q8 Weeks Marcelyn Bruins, MD   30 mg at 04/30/22 0945        Review of Systems  Constitutional:  Negative for chills, fever, malaise/fatigue and weight loss.  HENT:  Negative for hearing loss, sore throat and tinnitus.   Eyes:  Negative for blurred vision and double vision.  Respiratory:  Positive for shortness of breath. Negative for cough, hemoptysis, sputum production, wheezing and stridor.   Cardiovascular:  Negative for chest pain, palpitations, orthopnea, leg swelling and PND.  Gastrointestinal:  Negative for abdominal pain, constipation, diarrhea, heartburn, nausea and vomiting.  Genitourinary:  Negative for dysuria, hematuria and urgency.  Musculoskeletal:  Negative for joint pain and myalgias.  Skin:  Negative for itching and rash.  Neurological:  Negative for dizziness, tingling, weakness and headaches.  Endo/Heme/Allergies:  Negative for environmental allergies. Does not bruise/bleed easily.  Psychiatric/Behavioral:  Negative for depression. The patient is not nervous/anxious and does not have insomnia.   All other systems reviewed and are negative.     Objective:   General appearance: 82 y.o., female, NAD, conversant  Eyes: anicteric sclerae, moist conjunctivae; no lid-lag; PERRLA, tracking appropriately HENT: NCAT; oropharynx, MMM, no mucosal ulcerations; normal hard and soft palate Neck: Trachea midline; FROM, supple, lymphadenopathy, no JVD Lungs:BL diminished breaths sounds  CV: RRR, S1, S2, no MRGs  Abdomen: Soft, non-tender; non-distended, BS present  Extremities: No peripheral edema, radial and DP pulses present bilaterally  Skin: Normal temperature, turgor and texture; no rash Psych: Appropriate affect Neuro: Alert and oriented to person and place, no focal  deficit         Chest Imaging: CT chest 2020-05-17: Patient found to have left lower lobe 3 cm lung mass with associated mediastinal adenopathy concerning for primary bronchogenic carcinoma and advanced age. Images reviewed today in the office.The patient's images have been independently reviewed by me.     02/28/2021 CT chest: Dramatic improvement of the left lower lobe lesion, radiation fibrosis. No significant hilar adenopathy. The patient's images have been independently reviewed by me.  .   CT chest 04/10/2022: Signs of bronchiectasis pleural and parenchymal scarring in the left hilum related to previous treatments.  Large nodule along the pleural surface in the right chest and right upper lobe along the minor fissure indeterminate given morphology.  May consider PET scan.  Felt for need for short-term image follow-up. The patient's images have been independently reviewed by me.       Pulmonary Functions Testing Results:       No data to display             FeNO:    Pathology:    06/10/2020:  FINAL MICROSCOPIC DIAGNOSIS:  D. LYMPH NODE, 7 NODE, FINE NEEDLE ASPIRATION:  - Malignant cells consistent with non-small cell carcinoma. The patient's path have been reviewed by me.     Echocardiogram:    Heart Catheterization:    Assessment & Plan:     H/o Lung cancer  Pleural Effusion  Plan: Here today for planned thoracentesis She has held her plavix.  [name], DO  Pulmonary Critical Care 12/03/2022 1:23 PM

## 2022-12-03 NOTE — Discharge Instructions (Addendum)
Observe site for signs of infection/bleeding. Remove bandaid this evening. Notify concerns.

## 2022-12-04 ENCOUNTER — Encounter (HOSPITAL_COMMUNITY): Payer: Self-pay | Admitting: Pulmonary Disease

## 2022-12-06 LAB — CYTOLOGY - NON PAP

## 2022-12-07 LAB — CULTURE, BODY FLUID W GRAM STAIN -BOTTLE

## 2022-12-08 LAB — CULTURE, BODY FLUID W GRAM STAIN -BOTTLE: Culture: NO GROWTH

## 2022-12-12 ENCOUNTER — Ambulatory Visit (INDEPENDENT_AMBULATORY_CARE_PROVIDER_SITE_OTHER): Payer: Medicare HMO | Admitting: *Deleted

## 2022-12-12 DIAGNOSIS — J4550 Severe persistent asthma, uncomplicated: Secondary | ICD-10-CM | POA: Diagnosis not present

## 2022-12-17 NOTE — Progress Notes (Signed)
Ms. Grinnan,   Fluid studies reviewed. No cancer cells present. We will follow up in the office with repeat imaging.   Thanks,  BLI  [name], DO [facility] 12/17/2022 11:34 AM

## 2022-12-22 ENCOUNTER — Other Ambulatory Visit: Payer: Self-pay | Admitting: Internal Medicine

## 2022-12-31 MED ORDER — HEPARIN SOD (PORK) LOCK FLUSH 100 UNIT/ML IV SOLN: 500.0000 [IU] | Freq: Once | INTRAVENOUS | Status: DC

## 2022-12-31 MED ORDER — SODIUM CHLORIDE 0.9% FLUSH: 10.0000 mL | INTRAVENOUS | Status: DC | PRN

## 2023-01-03 ENCOUNTER — Encounter: Payer: Self-pay | Admitting: Internal Medicine

## 2023-01-03 ENCOUNTER — Ambulatory Visit
Admission: RE | Admit: 2023-01-03 | Discharge: 2023-01-03 | Disposition: A | Discharge status: Home / Self Care | Payer: Medicare HMO | Source: Ambulatory Visit | Attending: Internal Medicine | Admitting: Internal Medicine

## 2023-01-03 ENCOUNTER — Other Ambulatory Visit: Payer: Self-pay

## 2023-01-03 DIAGNOSIS — E041 Nontoxic single thyroid nodule: Secondary | ICD-10-CM | POA: Diagnosis not present

## 2023-01-03 DIAGNOSIS — I700 Atherosclerosis of aorta: Secondary | ICD-10-CM | POA: Diagnosis not present

## 2023-01-03 DIAGNOSIS — C3490 Malignant neoplasm of unspecified part of unspecified bronchus or lung: Secondary | ICD-10-CM

## 2023-01-03 MED ORDER — POTASSIUM CHLORIDE CRYS ER 20 MEQ PO TBCR: 40.0000 meq | EXTENDED_RELEASE_TABLET | Freq: Every day | ORAL | 5 refills | Status: DC | PRN

## 2023-01-03 MED ORDER — IOPAMIDOL (ISOVUE-300) INJECTION 61%
500.0000 mL | Freq: Once | INTRAVENOUS | Status: AC | PRN
  Administered 2023-01-03: 75 mL via INTRAVENOUS

## 2023-01-04 ENCOUNTER — Other Ambulatory Visit: Payer: Self-pay

## 2023-01-04 MED ORDER — LEVOTHYROXINE SODIUM 50 MCG PO TABS: 50.0000 ug | ORAL_TABLET | Freq: Every day | ORAL | 0 refills | Status: DC

## 2023-01-08 ENCOUNTER — Inpatient Hospital Stay: Payer: Medicare HMO | Attending: Internal Medicine

## 2023-01-08 ENCOUNTER — Other Ambulatory Visit: Payer: Self-pay

## 2023-01-08 DIAGNOSIS — C3432 Malignant neoplasm of lower lobe, left bronchus or lung: Secondary | ICD-10-CM | POA: Insufficient documentation

## 2023-01-08 DIAGNOSIS — Z923 Personal history of irradiation: Secondary | ICD-10-CM | POA: Insufficient documentation

## 2023-01-08 DIAGNOSIS — Z9221 Personal history of antineoplastic chemotherapy: Secondary | ICD-10-CM | POA: Insufficient documentation

## 2023-01-08 DIAGNOSIS — C3490 Malignant neoplasm of unspecified part of unspecified bronchus or lung: Secondary | ICD-10-CM

## 2023-01-08 DIAGNOSIS — D509 Iron deficiency anemia, unspecified: Secondary | ICD-10-CM

## 2023-01-08 DIAGNOSIS — N644 Mastodynia: Secondary | ICD-10-CM

## 2023-01-08 DIAGNOSIS — Z95828 Presence of other vascular implants and grafts: Secondary | ICD-10-CM

## 2023-01-08 DIAGNOSIS — N63 Unspecified lump in unspecified breast: Secondary | ICD-10-CM

## 2023-01-08 LAB — CMP (CANCER CENTER ONLY)
ALT: 10 U/L (ref 0–44)
AST: 19 U/L (ref 15–41)
Albumin: 3.9 g/dL (ref 3.5–5.0)
Alkaline Phosphatase: 67 U/L (ref 38–126)
Anion gap: 5 (ref 5–15)
BUN: 11 mg/dL (ref 8–23)
CO2: 29 mmol/L (ref 22–32)
Calcium: 9.4 mg/dL (ref 8.9–10.3)
Chloride: 106 mmol/L (ref 98–111)
Creatinine: 0.86 mg/dL (ref 0.44–1.00)
GFR, Estimated: >60 mL/min (ref >60)
Glucose, Bld: 94 mg/dL (ref 70–99)
Potassium: 3.9 mmol/L (ref 3.5–5.1)
Sodium: 140 mmol/L (ref 135–145)
Total Bilirubin: 0.5 mg/dL (ref 0.3–1.2)
Total Protein: 7.8 g/dL (ref 6.5–8.1)

## 2023-01-08 LAB — CBC WITH DIFFERENTIAL (CANCER CENTER ONLY)
Abs Immature Granulocytes: 0.01 10*3/uL (ref 0.00–0.07)
Basophils Absolute: 0 10*3/uL (ref 0.0–0.1)
Basophils Relative: 0 %
Eosinophils Absolute: 0 10*3/uL (ref 0.0–0.5)
Eosinophils Relative: 0 %
HCT: 41.2 % (ref 36.0–46.0)
Hemoglobin: 14.2 g/dL (ref 12.0–15.0)
Immature Granulocytes: 0 %
Lymphocytes Relative: 35 %
Lymphs Abs: 1.6 10*3/uL (ref 0.7–4.0)
MCH: 30.1 pg (ref 26.0–34.0)
MCHC: 34.5 g/dL (ref 30.0–36.0)
MCV: 87.3 fL (ref 80.0–100.0)
Monocytes Absolute: 0.4 10*3/uL (ref 0.1–1.0)
Monocytes Relative: 10 %
Neutro Abs: 2.5 10*3/uL (ref 1.7–7.7)
Neutrophils Relative %: 55 %
Platelet Count: 196 10*3/uL (ref 150–400)
RBC: 4.72 MIL/uL (ref 3.87–5.11)
RDW: 15.2 % (ref 11.5–15.5)
WBC Count: 4.5 10*3/uL (ref 4.0–10.5)
nRBC: 0 % (ref 0.0–0.2)

## 2023-01-08 LAB — IRON AND IRON BINDING CAPACITY (CC-WL,HP ONLY)
Iron: 83 ug/dL (ref 28–170)
Saturation Ratios: 30 % (ref 10.4–31.8)
TIBC: 273 ug/dL (ref 250–450)
UIBC: 190 ug/dL (ref 148–442)

## 2023-01-08 LAB — FERRITIN: Ferritin: 180 ng/mL (ref 11–307)

## 2023-01-08 MED ORDER — SODIUM CHLORIDE 0.9% FLUSH
10.0000 mL | Freq: Once | INTRAVENOUS | Status: AC | PRN
  Administered 2023-01-08: 10 mL

## 2023-01-08 MED ORDER — HEPARIN SOD (PORK) LOCK FLUSH 100 UNIT/ML IV SOLN
500.0000 [IU] | Freq: Once | INTRAVENOUS | Status: AC | PRN
  Administered 2023-01-08: 500 [IU]

## 2023-01-09 ENCOUNTER — Other Ambulatory Visit: Payer: Self-pay | Admitting: Internal Medicine

## 2023-01-09 ENCOUNTER — Other Ambulatory Visit: Payer: Self-pay

## 2023-01-10 ENCOUNTER — Ambulatory Visit: Payer: Medicare HMO | Admitting: Internal Medicine

## 2023-01-10 ENCOUNTER — Other Ambulatory Visit: Payer: Medicare HMO

## 2023-01-10 ENCOUNTER — Inpatient Hospital Stay (HOSPITAL_BASED_OUTPATIENT_CLINIC_OR_DEPARTMENT_OTHER): Payer: Medicare HMO | Admitting: Internal Medicine

## 2023-01-10 VITALS — BP 140/88 | HR 68 | Temp 97.4°F | Resp 17 | Ht 62.0 in | Wt 130.0 lb

## 2023-01-10 DIAGNOSIS — Z923 Personal history of irradiation: Secondary | ICD-10-CM | POA: Diagnosis not present

## 2023-01-10 DIAGNOSIS — C3490 Malignant neoplasm of unspecified part of unspecified bronchus or lung: Secondary | ICD-10-CM

## 2023-01-10 DIAGNOSIS — Z9221 Personal history of antineoplastic chemotherapy: Secondary | ICD-10-CM | POA: Diagnosis not present

## 2023-01-10 DIAGNOSIS — C3432 Malignant neoplasm of lower lobe, left bronchus or lung: Secondary | ICD-10-CM | POA: Diagnosis not present

## 2023-01-10 NOTE — Progress Notes (Signed)
[facility] [phone]   Fax:([phone]  OFFICE PROGRESS NOTE  [name], MD [address] [place] [address]  DIAGNOSIS: Stage IIIB (T2 a, N3, M0) non-small cell lung cancer, adenocarcinoma presented with left lower lobe lung mass in addition to left hilar and mediastinal as well as right and left cervical lymphadenopathy diagnosed in January 2022  PRIOR THERAPY:  1) Concurrent chemoradiation with weekly carboplatin for AUC of 2 and paclitaxel 45 mg/M2.  Status post 5 cycles.  Last dose was given August 01, 2020. 2) Consolidation treatment with immunotherapy with Imfinzi 1500 mg IV every 4 weeks.  First dose September 06, 2020.  Status post 3 cycles.  This treatment was discontinued secondary to intolerance and hospital admission with sepsis.  CURRENT THERAPY: Observation.  INTERVAL HISTORY: Angelica [name] 82 y.o. female is to the clinic today for follow-up visit accompanied by her daughter.  The patient is feeling fine today with no concerning complaints.  She was recently treated for infection under the left breast fold.  She denied having any current chest pain, shortness of breath, cough or hemoptysis.  She recently underwent ultrasound-guided left sided thoracentesis under the care of Dr. Tonia Brooms with drainage of 450 mL.  The patient denied having any current nausea, vomiting, diarrhea or constipation.  She has no headache or visual changes.  She has no recent weight loss or night sweats.  She is here today for evaluation and repeat CT scan of the chest for restaging of her disease.  MEDICAL HISTORY: Past Medical History:  Diagnosis Date   Arthritis    Asthma    Bowel obstruction (HCC)    Cancer (HCC)    Environmental allergies    GERD (gastroesophageal reflux disease)    Glaucoma 05/09/2017   H. pylori infection    History of radiation therapy 07/05/20-08/05/20   Left lung IMRT Dr. Roselind Messier    HLD (hyperlipidemia) 01/16/2019    Hypertension    Iron deficiency anemia    Osteopenia 05/26/2017   Thyroid disease    Urticaria 07/25/2018    ALLERGIES:  is allergic to asa buff (mag [buffered aspirin], ensure [nutritional supplements], fish allergy, other, peanut-containing drug products, penicillins, shellfish-derived products, and strawberry extract.  MEDICATIONS:  Current Outpatient Medications  Medication Sig Dispense Refill   acetaminophen (TYLENOL) 500 MG tablet Take 1,000 mg by mouth every 6 (six) hours as needed for moderate pain.     albuterol (VENTOLIN HFA) 108 (90 Base) MCG/ACT inhaler Inhale 2 puffs into the lungs every 4 (four) hours as needed for wheezing or shortness of breath. 18 g 1   alum & mag hydroxide-simeth (MAALOX/MYLANTA) 200-200-20 MG/5ML suspension Take 15 mLs by mouth every 6 (six) hours as needed for indigestion or heartburn.     clopidogrel (PLAVIX) 75 MG tablet Take 1 tablet by mouth once daily 90 tablet 3   diclofenac Sodium (VOLTAREN) 1 % GEL Apply 4 g topically 4 (four) times daily. (Patient taking differently: Apply 4 g topically 4 (four) times daily as needed (pain).) 100 g 0   EPINEPHrine (EPIPEN 2-PAK) 0.3 mg/0.3 mL IJ SOAJ injection Use as directed for severe allergic reaction 2 each 1   EQ ALLERGY RELIEF, CETIRIZINE, 10 MG tablet Take 1 tablet by mouth once daily 90 tablet 0   fluticasone (FLONASE) 50 MCG/ACT nasal spray Place 1 spray into both nostrils in the morning and at bedtime. (Patient taking differently: Place 1 spray into both nostrils  daily.) 16 g 5   folic acid (FOLVITE) 1 MG tablet Take 1 tablet (1 mg total) by mouth daily. 90 tablet 0   furosemide (LASIX) 40 MG tablet Take 1 tablet (40 mg total) by mouth daily. (Patient taking differently: Take 40 mg by mouth daily as needed for edema.) 30 tablet 11   ipratropium (ATROVENT) 0.06 % nasal spray Place 2 sprays into both nostrils 4 (four) times daily as needed (nasal drainage). (Patient taking differently: Place 2 sprays into both  nostrils daily.) 15 mL 5   levothyroxine (SYNTHROID) 50 MCG tablet TAKE 1 TABLET BY MOUTH ONCE DAILY BEFORE BREAKFAST 90 tablet 3   Magnesium Hydroxide (PHILLIPS MILK OF MAGNESIA PO) Take 15-30 mLs by mouth daily as needed (for constipation).     memantine (NAMENDA) 10 MG tablet Take 1 tablet by mouth twice daily 60 tablet 3   montelukast (SINGULAIR) 10 MG tablet Take 1 tablet (10 mg total) by mouth at bedtime. 30 tablet 5   ondansetron (ZOFRAN-ODT) 4 MG disintegrating tablet Take 1 tablet (4 mg total) by mouth every 8 (eight) hours as needed for nausea or vomiting. 10 tablet 0   pantoprazole (PROTONIX) 40 MG tablet Take 1 tablet by mouth once daily 90 tablet 2   potassium chloride SA (KLOR-CON M) 20 MEQ tablet Take 2 tablets (40 mEq total) by mouth daily as needed. Take potasium days you take lasix. 60 tablet 5   pravastatin (PRAVACHOL) 80 MG tablet Take 1 tablet by mouth once daily 90 tablet 0   SYMBICORT 80-4.5 MCG/ACT inhaler INHALE 2 PUFFS BY MOUTH IN THE MORNING AND AT BEDTIME (Patient taking differently: Inhale into the lungs See admin instructions. Inhale 2 puffs once a day, may take a second dose of 2 puffs when needed for shortness of breath) 10.2 g 5   Current Facility-Administered Medications  Medication Dose Route Frequency Provider Last Rate Last Admin   Benralizumab SOSY 30 mg  30 mg Subcutaneous Q8 Weeks Marcelyn Bruins, MD   30 mg at 12/12/22 1054    SURGICAL HISTORY:  Past Surgical History:  Procedure Laterality Date   ABDOMINAL HYSTERECTOMY     BOWEL RESECTION N/A 05/23/2020   Procedure: SMALL BOWEL REPAIR;  Surgeon: Sheliah Hatch De Blanch, MD;  Location: [facility] OR;  Service: General;  Laterality: N/A;   BREAST BIOPSY     BRONCHIAL BIOPSY  06/10/2020   Procedure: BRONCHIAL BIOPSIES;  Surgeon: [name], DO;  Location: [facility];  Service: Pulmonary;;   BRONCHIAL BRUSHINGS  06/10/2020   Procedure: BRONCHIAL BRUSHINGS;  Surgeon: [name], DO;  Location:  [facility];  Service: Pulmonary;;   BRONCHIAL NEEDLE ASPIRATION BIOPSY  06/10/2020   Procedure: BRONCHIAL NEEDLE ASPIRATION BIOPSIES;  Surgeon: [name], DO;  Location: [facility];  Service: Pulmonary;;   BRONCHIAL WASHINGS  06/10/2020   Procedure: BRONCHIAL WASHINGS;  Surgeon: [name], DO;  Location: [facility];  Service: Pulmonary;;   COLON SURGERY     DIAGNOSTIC LARYNGOSCOPY N/A 05/23/2020   Procedure: ATTEMPTED DIAGNOSTIC LAPAROSCOPY WITH ADHESIONS;  Surgeon: Sheliah Hatch, De Blanch, MD;  Location: [facility] OR;  Service: General;  Laterality: N/A;   IR IMAGING GUIDED PORT INSERTION  07/15/2020   KNEE SURGERY     LAPAROTOMY N/A 04/18/2015   Procedure: Exploratory laparotomy with lysis of adhesions, possible bowel resection;  Surgeon: Axel Filler, MD;  Location: [facility] OR;  Service: General;  Laterality: N/A;   LAPAROTOMY N/A 05/23/2020   Procedure: EXPLORATORY LAPAROTOMY;  Surgeon: Sheliah Hatch,  De Blanch, MD;  Location: [facility] OR;  Service: General;  Laterality: N/A;   LYSIS OF ADHESION N/A 05/23/2020   Procedure: LYSIS OF ADHESION;  Surgeon: Sheliah Hatch De Blanch, MD;  Location: [facility] OR;  Service: General;  Laterality: N/A;   NASAL SINUS SURGERY     THORACENTESIS Left 12/03/2022   Procedure: THORACENTESIS;  Surgeon: [name], DO;  Location: [facility];  Service: Pulmonary;  Laterality: Left;   THYROID SURGERY     VIDEO BRONCHOSCOPY WITH ENDOBRONCHIAL NAVIGATION Bilateral 06/10/2020   Procedure: VIDEO BRONCHOSCOPY WITH ENDOBRONCHIAL NAVIGATION;  Surgeon: [name], DO;  Location: [facility];  Service: Pulmonary;  Laterality: Bilateral;   VIDEO BRONCHOSCOPY WITH ENDOBRONCHIAL ULTRASOUND  06/10/2020   Procedure: VIDEO BRONCHOSCOPY WITH ENDOBRONCHIAL ULTRASOUND;  Surgeon: [name], DO;  Location: [facility];  Service: Pulmonary;;    REVIEW OF SYSTEMS:  A comprehensive review of systems was negative except for: Constitutional: positive for fatigue   PHYSICAL  EXAMINATION: General appearance: alert, cooperative, and no distress Head: Normocephalic, without obvious abnormality, atraumatic Neck: no adenopathy, no JVD, supple, symmetrical, trachea midline, and thyroid not enlarged, symmetric, no tenderness/mass/nodules Lymph nodes: Cervical, supraclavicular, and axillary nodes normal. Resp: clear to auscultation bilaterally Back: symmetric, no curvature. ROM normal. No CVA tenderness. Cardio: regular rate and rhythm, S1, S2 normal, no murmur, click, rub or gallop GI: soft, non-tender; bowel sounds normal; no masses,  no organomegaly Extremities: extremities normal, atraumatic, no cyanosis or edema  ECOG PERFORMANCE STATUS: 1 - Symptomatic but completely ambulatory  Blood pressure (!) 140/88, pulse 68, temperature (!) 97.4 F (36.3 C), temperature source Oral, resp. rate 17, height 5\' 2"  (1.575 m), weight 130 lb (59 kg), SpO2 100%.  LABORATORY DATA: Lab Results  Component Value Date   WBC 4.5 01/08/2023   HGB 14.2 01/08/2023   HCT 41.2 01/08/2023   MCV 87.3 01/08/2023   PLT 196 01/08/2023      Chemistry      Component Value Date/Time   NA 140 01/08/2023 1007   K 3.9 01/08/2023 1007   CL 106 01/08/2023 1007   CO2 29 01/08/2023 1007   BUN 11 01/08/2023 1007   CREATININE 0.86 01/08/2023 1007   CREATININE 0.86 11/02/2022 1604      Component Value Date/Time   CALCIUM 9.4 01/08/2023 1007   ALKPHOS 67 01/08/2023 1007   AST 19 01/08/2023 1007   ALT 10 01/08/2023 1007   BILITOT 0.5 01/08/2023 1007       RADIOGRAPHIC STUDIES: CT Chest W Contrast  Result Date: 01/10/2023 CLINICAL DATA:  Non-small cell lung cancer staging. Prior chemotherapy and radiation therapy. * Tracking Code: BO * EXAM: CT CHEST WITH CONTRAST TECHNIQUE: Multidetector CT imaging of the chest was performed during intravenous contrast administration. RADIATION DOSE REDUCTION: This exam was performed according to the departmental dose-optimization program which includes  automated exposure control, adjustment of the mA and/or kV according to patient size and/or use of iterative reconstruction technique. CONTRAST:  75mL ISOVUE-300 IOPAMIDOL (ISOVUE-300) INJECTION 61% COMPARISON:  10/05/2022 FINDINGS: Cardiovascular: Right Port-A-Cath tip: SVC. SVC caliber below the Port-A-Cath is severely attenuated and possibly occluded. Substantial azygous collateral flow. Anterior chest wall collateral venous structures noted. Coronary, aortic arch, and branch vessel atherosclerotic vascular disease. Mediastinum/Nodes: Multiple thyroid nodules, largest 2.2 cm in the left posterior low bone image 13 series 2. I do not find records of prior thyroid imaging workup. Recommend thyroid US (ref: J Am Coll Radiol. 2015 Feb;12(2): 143-50). Infiltrative edema in the mediastinum commensurate  to the subcutaneous edema observed. No pathologic adenopathy identified. Lungs/Pleura: Moderate bilateral pleural effusions, nonspecific for transudative or exudative etiology. Mild biapical pleuroparenchymal scarring. Passive atelectasis associated with the pleural effusions. The ground-glass density nodularity anteriorly in the left upper lobe referenced on the 07/10/2022 exam has resolved. Upper Abdomen: Unremarkable Musculoskeletal: Diffuse subcutaneous edema. Degenerative glenohumeral arthropathy bilaterally. Thoracic spondylosis. IMPRESSION: 1. No findings of active malignancy. 2. Severe attenuation of the SVC below the Port-A-Cath tip, possibly occluded. Substantial azygous and anterior chest wall venous collaterals. 3. Moderate bilateral pleural effusions with associated passive atelectasis. 4. Likely third spacing of fluid with diffuse subcutaneous and mediastinal infiltrative edema. 5. Multiple thyroid nodules, largest 2.2 cm in the left posterior low bone. Recommend thyroid US. 6. Aortic and coronary atherosclerosis. Aortic Atherosclerosis (ICD10-I70.0). Electronically Signed   By: Gaylyn Rong M.D.    On: 01/10/2023 09:49     ASSESSMENT AND PLAN: This is a very pleasant 82 years old African-American female recently diagnosed with a stage IIIb (T2a, N3, M0) non-small cell lung cancer, adenocarcinoma presented with left lower lobe lung mass in addition to left hilar and mediastinal lymphadenopathy as well as right and left cervical lymphadenopathy diagnosed in January 2022. The patient underwent a course of concurrent chemoradiation with weekly carboplatin and paclitaxel status post 5 cycles with partial response.   The patient underwent consolidation treatment with Imfinzi 1500 Mg IV every 4 weeks status post 3 cycles.  She has been tolerating this treatment well but during the last infusion she developed systemic inflammatory response syndrome with fever and blood work showed bacteremia secondary to Klebsiella pneumonia treated with antibiotic during her hospitalization.  She is currently on observation. The patient has been doing fine with no concerning complaints. She had repeat CT scan of the chest performed recently.  I personally and independently reviewed the scan and discussed the result with the patient and her daughter. Her scan showed no concerning findings for disease progression.  She continues to have evidence of occluded Port-A-Cath. I recommended for the patient to continue on observation with repeat CT scan of the chest in 6 months. I will refer the patient to IR for Port-A-Cath removal. She was advised to call immediately if she has any other concerning symptoms in the interval. The patient voices understanding of current disease status and treatment options and is in agreement with the current care plan.  All questions were answered. The patient knows to call the clinic with any problems, questions or concerns. We can certainly see the patient much sooner if necessary.  Disclaimer: This note was dictated with voice recognition software. Similar sounding words can inadvertently be  transcribed and may not be corrected upon review.

## 2023-01-14 ENCOUNTER — Other Ambulatory Visit: Payer: Self-pay | Admitting: Internal Medicine

## 2023-01-14 ENCOUNTER — Other Ambulatory Visit: Payer: Self-pay

## 2023-01-16 ENCOUNTER — Ambulatory Visit (HOSPITAL_COMMUNITY)
Admission: RE | Admit: 2023-01-16 | Discharge: 2023-01-16 | Disposition: A | Discharge status: Home / Self Care | Payer: Medicare HMO | Source: Ambulatory Visit | Attending: Internal Medicine | Admitting: Internal Medicine

## 2023-01-16 DIAGNOSIS — C3490 Malignant neoplasm of unspecified part of unspecified bronchus or lung: Secondary | ICD-10-CM | POA: Diagnosis not present

## 2023-01-16 DIAGNOSIS — Z452 Encounter for adjustment and management of vascular access device: Secondary | ICD-10-CM | POA: Insufficient documentation

## 2023-01-16 HISTORY — PX: IR REMOVAL TUN ACCESS W/ PORT W/O FL MOD SED: IMG2290

## 2023-01-16 MED ORDER — LIDOCAINE-EPINEPHRINE 1 %-1:100000 IJ SOLN
INTRAMUSCULAR | Status: AC
  Filled 2023-01-16: qty 1

## 2023-01-16 NOTE — Procedures (Signed)
Pre Procedural Dx: Poor venous access Post Procedural Dx: Same  Successful removal of anterior chest wall port-a-cath.  EBL: Trace No immediate post procedural complications.   Jay Watts, MD Pager #: [phone]  

## 2023-02-06 ENCOUNTER — Telehealth: Payer: Self-pay | Admitting: Allergy

## 2023-02-06 ENCOUNTER — Ambulatory Visit (INDEPENDENT_AMBULATORY_CARE_PROVIDER_SITE_OTHER): Payer: Medicare HMO | Admitting: *Deleted

## 2023-02-06 ENCOUNTER — Other Ambulatory Visit: Payer: Self-pay | Admitting: Allergy

## 2023-02-06 DIAGNOSIS — J4550 Severe persistent asthma, uncomplicated: Secondary | ICD-10-CM

## 2023-02-06 NOTE — Telephone Encounter (Signed)
Patient needs a refill on medication please call @ [phone]. Pharmacy is [address].

## 2023-02-06 NOTE — Telephone Encounter (Signed)
Tried to call pt to find out what she needs refilled lm for her to call us back

## 2023-02-07 NOTE — Telephone Encounter (Signed)
I called patient for clarification on what medication she needs refilled. I left a message to call the office back.

## 2023-02-08 ENCOUNTER — Encounter: Payer: Self-pay | Admitting: Internal Medicine

## 2023-02-08 ENCOUNTER — Telehealth: Payer: Self-pay | Admitting: Internal Medicine

## 2023-02-08 NOTE — Telephone Encounter (Signed)
Sorry no, would need OV for this, or consider UC or ED    thanks

## 2023-02-08 NOTE — Telephone Encounter (Signed)
Sent My-Chart message

## 2023-02-08 NOTE — Telephone Encounter (Signed)
Pt would need OV or UC or ED.   She has hx of antibiotic for same in the past by another provider, but it was not clear to me that this was effective, as she had the similar compliants after the antibx treatment

## 2023-02-08 NOTE — Telephone Encounter (Signed)
Pt daughter called wanting to know if Dr. Jonny Ruiz can prescribed antibiotics like last time for swelling in pt left breast. Please advise.

## 2023-02-11 NOTE — Telephone Encounter (Signed)
I called patient's daughter and she said that she already got the inhaler refilled.

## 2023-02-13 ENCOUNTER — Ambulatory Visit (INDEPENDENT_AMBULATORY_CARE_PROVIDER_SITE_OTHER): Payer: Medicare HMO

## 2023-02-13 ENCOUNTER — Ambulatory Visit (INDEPENDENT_AMBULATORY_CARE_PROVIDER_SITE_OTHER): Payer: Medicare HMO | Admitting: Internal Medicine

## 2023-02-13 VITALS — BP 130/80 | HR 75 | Temp 98.1°F | Ht 62.0 in | Wt 127.0 lb

## 2023-02-13 DIAGNOSIS — I10 Essential (primary) hypertension: Secondary | ICD-10-CM | POA: Diagnosis not present

## 2023-02-13 DIAGNOSIS — Z23 Encounter for immunization: Secondary | ICD-10-CM | POA: Diagnosis not present

## 2023-02-13 DIAGNOSIS — C3492 Malignant neoplasm of unspecified part of left bronchus or lung: Secondary | ICD-10-CM

## 2023-02-13 DIAGNOSIS — J90 Pleural effusion, not elsewhere classified: Secondary | ICD-10-CM

## 2023-02-13 DIAGNOSIS — J811 Chronic pulmonary edema: Secondary | ICD-10-CM | POA: Diagnosis not present

## 2023-02-13 DIAGNOSIS — J9811 Atelectasis: Secondary | ICD-10-CM | POA: Diagnosis not present

## 2023-02-13 DIAGNOSIS — N644 Mastodynia: Secondary | ICD-10-CM | POA: Diagnosis not present

## 2023-02-13 NOTE — Patient Instructions (Addendum)
The swelling of the left breast is not due to infection, and likey is related to difficult to see worsening of the situation with the fluid around the lungs  We should hold on lasix since your shortness of breath is not overly worsening, and continue the inhaler as you mentioned.  There is no need for antibiotic today  Please continue all other medications as before, and refills have been done if requested.  Please have the pharmacy call with any other refills you may need.  Please keep your appointments with your specialists as you may have planned - oncology as planned with CT scan in 5 more months (unless you get worse overall before then)  Please go to the XRAY Department in the first floor for the x-ray testing  You will be contacted regarding the referral for: Dr Tonia Brooms pulmonary (urgent) as you will likely need repeat thoracentesis for the left chest at some point, maybe soon  Please make an Appointment to return in 4 months, or sooner if needed

## 2023-02-13 NOTE — Progress Notes (Signed)
Patient ID: Angelica [name], female   DOB: 1940/06/28, 82 y.o.   MRN: [id number]        Chief Complaint: follow up left breast swelling without pain fever, pleural effusion, hx of lung adenocarcinoma       HPI:  Angelica [name] is a 82 y.o. female here with c/o 1 wk worsening left breast sweling with some discomfort but no redness, pain or other skin change; has known hx of left pleural effusion and underlying hx of lung Ca adenocarcinoma, stable by CT aug 2024, and last seen per oncology then, and pulmonary July 2024.  Did have improvement with left chest thoracentesis several months ago.  Pt denies chest pain, increased sob or doe, wheezing, orthopnea, PND, increased LE swelling, palpitations, dizziness or syncope.   Pt denies polydipsia, polyuria, or new focal neuro s/s.    Pt denies fever, wt loss, night sweats, loss of appetite, or other constitutional symptoms          Wt Readings from Last 3 Encounters:  02/14/23 125 lb 6.4 oz (56.9 kg)  02/13/23 127 lb (57.6 kg)  01/10/23 130 lb (59 kg)   BP Readings from Last 3 Encounters:  02/14/23 130/80  02/13/23 130/80  01/10/23 (!) 140/88         Past Medical History:  Diagnosis Date   Arthritis    Asthma    Bowel obstruction (HCC)    Cancer (HCC)    Environmental allergies    GERD (gastroesophageal reflux disease)    Glaucoma 05/09/2017   H. pylori infection    History of radiation therapy 07/05/20-08/05/20   Left lung IMRT Dr. Roselind Messier    HLD (hyperlipidemia) 01/16/2019   Hypertension    Iron deficiency anemia    Osteopenia 05/26/2017   Thyroid disease    Urticaria 07/25/2018   Past Surgical History:  Procedure Laterality Date   ABDOMINAL HYSTERECTOMY     BOWEL RESECTION N/A 05/23/2020   Procedure: SMALL BOWEL REPAIR;  Surgeon: Sheliah Hatch, De Blanch, MD;  Location: [facility] OR;  Service: General;  Laterality: N/A;   BREAST BIOPSY     BRONCHIAL BIOPSY  06/10/2020   Procedure: BRONCHIAL BIOPSIES;  Surgeon: [name], DO;  Location: [facility]  ENDOSCOPY;  Service: Pulmonary;;   BRONCHIAL BRUSHINGS  06/10/2020   Procedure: BRONCHIAL BRUSHINGS;  Surgeon: [name], DO;  Location: [facility] ENDOS[facility];  Service: Pulmonary;;   BRONCHIAL NEEDLE ASPIRATION BIOPSY  06/10/2020   Procedure: BRONCHIAL NEEDLE ASPIRATION BIOPSIES;  Surgeon: [name], DO;  Location: [facility] ENDOS[facility];  Service: Pulmonary;;   BRONCHIAL WASHINGS  06/10/2020   Procedure: BRONCHIAL WASHINGS;  Surgeon: [name], DO;  Location: [facility] ENDOS[facility];  Service: Pulmonary;;   COLON SURGERY     DIAGNOSTIC LARYNGOSCOPY N/A 05/23/2020   Procedure: ATTEMPTED DIAGNOSTIC LAPAROSCOPY WITH ADHESIONS;  Surgeon: Sheliah Hatch, De Blanch, MD;  Location: [facility] OR;  Service: General;  Laterality: N/A;   IR IMAGING GUIDED PORT INSERTION  07/15/2020   IR REMOVAL TUN ACCESS W/ PORT W/O FL MOD SED  01/16/2023   KNEE SURGERY     LAPAROTOMY N/A 04/18/2015   Procedure: Exploratory laparotomy with lysis of adhesions, possible bowel resection;  Surgeon: Axel Filler, MD;  Location: [facility] OR;  Service: General;  Laterality: N/A;   LAPAROTOMY N/A 05/23/2020   Procedure: EXPLORATORY LAPAROTOMY;  Surgeon: Rodman Pickle, MD;  Location: [facility] OR;  Service: General;  Laterality: N/A;   LYSIS OF ADHESION N/A 05/23/2020   Procedure: LYSIS OF ADHESION;  Surgeon: Sheliah Hatch, De Blanch, MD;  Location: [facility] OR;  Service: General;  Laterality: N/A;   NASAL SINUS SURGERY     THORACENTESIS Left 12/03/2022   Procedure: THORACENTESIS;  Surgeon: [name], DO;  Location: [facility] ENDOS[facility];  Service: Pulmonary;  Laterality: Left;   THYROID SURGERY     VIDEO BRONCHOSCOPY WITH ENDOBRONCHIAL NAVIGATION Bilateral 06/10/2020   Procedure: VIDEO BRONCHOSCOPY WITH ENDOBRONCHIAL NAVIGATION;  Surgeon: [name], DO;  Location: [facility];  Service: Pulmonary;  Laterality: Bilateral;   VIDEO BRONCHOSCOPY WITH ENDOBRONCHIAL ULTRASOUND  06/10/2020   Procedure: VIDEO BRONCHOSCOPY WITH ENDOBRONCHIAL ULTRASOUND;  Surgeon: [name], DO;  Location: [facility] ENDOS[facility];  Service: Pulmonary;;    reports that she has quit smoking. Her smoking use included cigarettes. She has never used smokeless tobacco. She reports that she does not drink alcohol and does not use drugs. family history includes Diabetes in her mother; Glaucoma in her brother and sister; Hypertension in her father and mother. Allergies  Allergen Reactions   Asa Buff (Mag [Buffered Aspirin] Other (See Comments)    Excessive sweating   Ensure [Nutritional Supplements]     Or boost - upset stomach    Fish Allergy Other (See Comments)    Unknown reaction, but allergic   Other Other (See Comments)    Gelcaps; gel-containing capsules; extended-release medication - upset stomach    Peanut-Containing Drug Products Other (See Comments)    From allergy test   Penicillins Itching   Shellfish-Derived Products Other (See Comments)    From allergy test   Strawberry Extract Nausea And Vomiting and Other (See Comments)    From allergy test    Current Outpatient Medications on File Prior to Visit  Medication Sig Dispense Refill   acetaminophen (TYLENOL) 500 MG tablet Take 1,000 mg by mouth every 6 (six) hours as needed for moderate pain.     albuterol (VENTOLIN HFA) 108 (90 Base) MCG/ACT inhaler Inhale 2 puffs into the lungs every 4 (four) hours as needed for wheezing or shortness of breath. 18 g 1   alum & mag hydroxide-simeth (MAALOX/MYLANTA) 200-200-20 MG/5ML suspension Take 15 mLs by mouth every 6 (six) hours as needed for indigestion or heartburn.     clopidogrel (PLAVIX) 75 MG tablet Take 1 tablet by mouth once daily 90 tablet 3   diclofenac Sodium (VOLTAREN) 1 % GEL Apply 4 g topically 4 (four) times daily. (Patient taking differently: Apply 4 g topically 4 (four) times daily as needed (pain).) 100 g 0   EPINEPHrine (EPIPEN 2-PAK) 0.3 mg/0.3 mL IJ SOAJ injection Use as directed for severe allergic reaction 2 each 1   EQ ALLERGY RELIEF, CETIRIZINE, 10 MG  tablet Take 1 tablet by mouth once daily 90 tablet 0   fluticasone (FLONASE) 50 MCG/ACT nasal spray Place 1 spray into both nostrils in the morning and at bedtime. (Patient taking differently: Place 1 spray into both nostrils daily.) 16 g 5   furosemide (LASIX) 40 MG tablet Take 1 tablet (40 mg total) by mouth daily. (Patient taking differently: Take 40 mg by mouth daily as needed for edema.) 30 tablet 11   ipratropium (ATROVENT) 0.06 % nasal spray Place 2 sprays into both nostrils 4 (four) times daily as needed (nasal drainage). (Patient taking differently: Place 2 sprays into both nostrils daily.) 15 mL 5   levothyroxine (SYNTHROID) 50 MCG tablet TAKE 1 TABLET BY MOUTH ONCE DAILY BEFORE BREAKFAST (Patient not taking: Reported on 02/14/2023) 90 tablet 3   Magnesium Hydroxide (  PHILLIPS MILK OF MAGNESIA PO) Take 15-30 mLs by mouth daily as needed (for constipation).     memantine (NAMENDA) 10 MG tablet Take 1 tablet by mouth twice daily 60 tablet 3   montelukast (SINGULAIR) 10 MG tablet Take 1 tablet (10 mg total) by mouth at bedtime. 30 tablet 5   ondansetron (ZOFRAN-ODT) 4 MG disintegrating tablet Take 1 tablet (4 mg total) by mouth every 8 (eight) hours as needed for nausea or vomiting. 10 tablet 0   pantoprazole (PROTONIX) 40 MG tablet Take 1 tablet by mouth once daily 90 tablet 2   potassium chloride SA (KLOR-CON M) 20 MEQ tablet Take 2 tablets (40 mEq total) by mouth daily as needed. Take potasium days you take lasix. 60 tablet 5   pravastatin (PRAVACHOL) 80 MG tablet Take 1 tablet by mouth once daily 90 tablet 0   SYMBICORT 80-4.5 MCG/ACT inhaler INHALE 2 PUFFS BY MOUTH IN THE MORNING AND AT BEDTIME (Patient taking differently: Inhale into the lungs See admin instructions. Inhale 2 puffs once a day, may take a second dose of 2 puffs when needed for shortness of breath) 10.2 g 5   Current Facility-Administered Medications on File Prior to Visit  Medication Dose Route Frequency Provider Last Rate  Last Admin   Benralizumab SOSY 30 mg  30 mg Subcutaneous Q8 Weeks Marcelyn Bruins, MD   30 mg at 02/06/23 1041        ROS:  All others reviewed and negative.  Objective        PE:  BP 130/80 (BP Location: Left Arm, Patient Position: Sitting, Cuff Size: Normal)   Pulse 75   Temp 98.1 F (36.7 C) (Oral)   Ht 5\' 2"  (1.575 m)   Wt 127 lb (57.6 kg)   SpO2 95%   BMI 23.23 kg/m                 Constitutional: Pt appears in NAD               HENT: Head: NCAT.                Right Ear: External ear normal.                 Left Ear: External ear normal.                Eyes: . Pupils are equal, round, and reactive to light. Conjunctivae and EOM are normal               Nose: without d/c or deformity               Neck: Neck supple. Gross normal ROM;  left breast 2+ swelling with brawny edema but non tender, no redness or pain to palpate, no mass or nipple d/c               Cardiovascular: Normal rate and regular rhythm.                 Left breast 2+ enlarged, edematous but no mass, skin change, nipple d/c or redness and overall nontender               Pulmonary/Chest: Effort normal and breath sounds without rales but or wheezing but with left rhonchi and decreased BS both bases.                Abd:  Soft, NT, ND, + BS, no organomegaly  Neurological: Pt is alert. At baseline orientation, motor grossly intact               Skin: Skin is warm. No rashes, no other new lesions, LE edema -none              Psychiatric: Pt behavior is normal without agitation   Micro: none  Cardiac tracings I have personally interpreted today:  none  Pertinent Radiological findings (summarize): none   Lab Results  Component Value Date   WBC 4.5 01/08/2023   HGB 14.2 01/08/2023   HCT 41.2 01/08/2023   PLT 196 01/08/2023   GLUCOSE 94 01/08/2023   CHOL 191 07/18/2021   TRIG 83.0 07/18/2021   HDL 77.50 07/18/2021   LDLCALC 97 07/18/2021   ALT 10 01/08/2023   AST 19 01/08/2023   NA  140 01/08/2023   K 3.9 01/08/2023   CL 106 01/08/2023   CREATININE 0.86 01/08/2023   BUN 11 01/08/2023   CO2 29 01/08/2023   TSH 3.632 10/05/2022   INR 1.0 11/02/2020   HGBA1C 5.8 07/18/2021   Assessment/Plan:  SAPPHYRE MONASMITH is a 82 y.o. Black or African American [2] female with  has a past medical history of Arthritis, Asthma, Bowel obstruction (HCC), Cancer (HCC), Environmental allergies, GERD (gastroesophageal reflux disease), Glaucoma (05/09/2017), H. pylori infection, History of radiation therapy (07/05/20-08/05/20), HLD (hyperlipidemia) (01/16/2019), Hypertension, Iron deficiency anemia, Osteopenia (05/26/2017), Thyroid disease, and Urticaria (07/25/2018).  Adenocarcinoma of left lung, stage 3 (HCC) Suspect progressing recently based on increased edema, for f/u oncology as planned  Bilateral pleural effusion Likely worse left sided - for pulm referral and cxr, suspect will need repeat thoracentesis soon  Mastodynia of left breast Uncontrolled very recent worsening swelling with edema due to underlying comorbids,   No evidence for cellulitis, will hold on lasix for now given lack of other symptoms,  to f/u any worsening symptoms or concerns  HTN (hypertension) BP Readings from Last 3 Encounters:  02/14/23 130/80  02/13/23 130/80  01/10/23 (!) 140/88   Stable, pt to continue medical treatment  - diet, wt control  Followup: Return in about 4 months (around 06/15/2023).  Oliver Barre, MD 02/16/2023 8:37 PM [facility] [place]

## 2023-02-14 ENCOUNTER — Ambulatory Visit (INDEPENDENT_AMBULATORY_CARE_PROVIDER_SITE_OTHER): Payer: Medicare HMO | Admitting: Nurse Practitioner

## 2023-02-14 ENCOUNTER — Encounter: Payer: Self-pay | Admitting: Nurse Practitioner

## 2023-02-14 VITALS — BP 130/80 | HR 67 | Ht 65.0 in | Wt 125.4 lb

## 2023-02-14 DIAGNOSIS — J90 Pleural effusion, not elsewhere classified: Secondary | ICD-10-CM | POA: Diagnosis not present

## 2023-02-14 DIAGNOSIS — N644 Mastodynia: Secondary | ICD-10-CM | POA: Diagnosis not present

## 2023-02-14 DIAGNOSIS — J4550 Severe persistent asthma, uncomplicated: Secondary | ICD-10-CM

## 2023-02-14 DIAGNOSIS — C3490 Malignant neoplasm of unspecified part of unspecified bronchus or lung: Secondary | ICD-10-CM

## 2023-02-14 DIAGNOSIS — N6489 Other specified disorders of breast: Secondary | ICD-10-CM

## 2023-02-14 DIAGNOSIS — C3492 Malignant neoplasm of unspecified part of left bronchus or lung: Secondary | ICD-10-CM | POA: Diagnosis not present

## 2023-02-14 NOTE — Progress Notes (Signed)
@Patient  ID: Angelica [name], female    DOB: 03-03-1941, 82 y.o.   MRN: [id number]  Chief Complaint  Patient presents with   Acute Visit    Pt is here for Edema of Left Breast. PCP suspects fluid in lungs.    Referring provider: [name], MD  HPI: 82 year old female, former smoker followed for stage III adenocarcinoma of left lung and pleural effusion. She is a patient of Dr. Myrlene Broker and last seen via virtual visit 11/21/2022. She has severe asthma on biologic therapy and followed by asthma/allergy. Past medical history significant for HTN, chronic pansinusitis, nasal polyposis, LPRD, hypothyroid, dementia, HLD, IDA.   TEST/EVENTS:  11/29/2022 MR breast: no MRI evidence of malignancy. Findings of anasarca with b/l diffuse skin and soft tissue edema, massive on left as compared to right. Partially visualized pleural effusions.  01/03/2023 CT chest: right port-a-cath severely attenuated and possibly occluded. Substantial azygous collateral flow. Atherosclerosis. Multiple thyroid nodules, largest 2.2 cm. Infiltrative edema into the mediastinum commensurate to the subcutaneous edema. Moderate b/l pleural effusions. Mild biapical pleuroparenchymal scarring. Passive atelectasis. Ground glass density nodularity has resolved. Diffuse subcutaneous edema. Thoracic spondylosis.  02/13/2023 CXR: moderate left effusion. Right lung clear.   11/21/2022: Virtual visit with Dr. Tonia Brooms.  Recent hospitalization in May for acute respiratory failure secondary to edema and bilateral pleural effusions.  She also had swelling of the left arm and face.  Right-sided thoracentesis on 5/19 yielding 400 cc of fluid.  Transudative effusion.  Cytology negative for malignancy.  Received IV Lasix and albumin while in the hospital. Follow-up with PCP and repeat CXR demonstrated recurrent left-sided effusion so she was referred back for repeat thoracentesis.  Set up for procedure on 7/15 with Dr. Katrinka Blazing.  02/14/2023:  Today-acute Patient presents today for acute visit.  After her last visit, she had left thoracentesis with 450 cc of fluid removed on 7/15.  Cytology was negative for malignancy.  Had reactive mesothelial cells.  Exudative by lights criteria.  This was her second thoracentesis in total but the first on the left side.  Previous procedure was on the right. She is also been having difficulties with left breast swelling and pain.  She first noticed this after her last hospital admission in May.  She went to the ED on 5/25 and was treated for possible cellulitis with empiric doxycycline.  Given her history, her PCP did order a mammogram.  She had diffuse left breast skin and parenchymal thickening consistent with diffuse edema.  Acute onset was felt to represent mastitis; however, given her history and ultrasound of the left axilla was recommended.  There was no sonographic evidence of malignancy so she was advised to complete antibiotics and follow-up with her PCP.  Her daughter tells me that she she had failed to improve with the doxycycline so her PCP ordered Keflex when she was seen on 6/3.  Still had no improvement so at some point was changed back to doxycycline for 10 days.  Her daughter tells me that this did help some and her breast swelling had reduced but symptoms never entirely resolved.  They continue to remain red and swollen.  She also continued to have swelling in the left arm as well but they opted to monitor her symptoms.  They also started her on Lasix daily to help with the edema.  She went back to her PCP on 6/26 with worsening left breast and left upper extremity swelling.  He ordered an MR breast for  further evaluation, which was completed on 11/29/2022.  There was no evidence of malignancy in either breast and no abnormal appearing lymph nodes.  She had findings of anasarca with bilateral diffuse skin and soft tissue edema, massive on the left compared to the right.  Bilateral pleural effusions  were still present.  Etiology remained unclear.   She had a repeat CT scan of the chest ordered by Dr. Shirline Frees on 01/03/2023.  This revealed a severely attenuated and possibly occluded right Port-A-Cath, multiple thyroid nodules, moderate bilateral pleural effusions, scarring and atelectasis.  Previous groundglass density had resolved.  There was no obvious sign of recurrent disease.  She did have worsening infiltrative edema into the mediastinum and subcutaneous tissue.  She was advised by her PCP to come back to pulmonary for further evaluation. Swelling of left breast and LUE unchanged. She is not having any increased shortness of breath, cough, chest congestion or pleuritic pain. She has had weight loss of almost 20 lb upon review of her chart.    Allergies  Allergen Reactions   Asa Buff (Mag [Buffered Aspirin] Other (See Comments)    Excessive sweating   Ensure [Nutritional Supplements]     Or boost - upset stomach    Fish Allergy Other (See Comments)    Unknown reaction, but allergic   Other Other (See Comments)    Gelcaps; gel-containing capsules; extended-release medication - upset stomach    Peanut-Containing Drug Products Other (See Comments)    From allergy test   Penicillins Itching   Shellfish-Derived Products Other (See Comments)    From allergy test   Strawberry Extract Nausea And Vomiting and Other (See Comments)    From allergy test     Immunization History  Administered Date(s) Administered   Fluad Quad(high Dose 65+) 01/16/2019, 03/05/2020, 02/01/2021, 02/26/2022   Fluad Trivalent(High Dose 65+) 02/13/2023   Influenza, High Dose Seasonal PF 01/30/2018   PFIZER Comirnaty(Gray Top)Covid-19 Tri-Sucrose Vaccine 02/23/2022   PFIZER(Purple Top)SARS-COV-2 Vaccination 07/13/2019, 08/03/2019, 02/16/2020   Pneumococcal Conjugate-13 11/08/2017    Past Medical History:  Diagnosis Date   Arthritis    Asthma    Bowel obstruction (HCC)    Cancer (HCC)    Environmental  allergies    GERD (gastroesophageal reflux disease)    Glaucoma 05/09/2017   H. pylori infection    History of radiation therapy 07/05/20-08/05/20   Left lung IMRT Dr. Roselind Messier    HLD (hyperlipidemia) 01/16/2019   Hypertension    Iron deficiency anemia    Osteopenia 05/26/2017   Thyroid disease    Urticaria 07/25/2018    Tobacco History: Social History   Tobacco Use  Smoking Status Former   Types: Cigarettes  Smokeless Tobacco Never  Tobacco Comments   quit 60 years ago   Counseling given: Not Answered Tobacco comments: quit 60 years ago   Outpatient Medications Prior to Visit  Medication Sig Dispense Refill   acetaminophen (TYLENOL) 500 MG tablet Take 1,000 mg by mouth every 6 (six) hours as needed for moderate pain.     albuterol (VENTOLIN HFA) 108 (90 Base) MCG/ACT inhaler Inhale 2 puffs into the lungs every 4 (four) hours as needed for wheezing or shortness of breath. 18 g 1   alum & mag hydroxide-simeth (MAALOX/MYLANTA) 200-200-20 MG/5ML suspension Take 15 mLs by mouth every 6 (six) hours as needed for indigestion or heartburn.     clopidogrel (PLAVIX) 75 MG tablet Take 1 tablet by mouth once daily 90 tablet 3   diclofenac Sodium (VOLTAREN)  1 % GEL Apply 4 g topically 4 (four) times daily. (Patient taking differently: Apply 4 g topically 4 (four) times daily as needed (pain).) 100 g 0   EPINEPHrine (EPIPEN 2-PAK) 0.3 mg/0.3 mL IJ SOAJ injection Use as directed for severe allergic reaction 2 each 1   EQ ALLERGY RELIEF, CETIRIZINE, 10 MG tablet Take 1 tablet by mouth once daily 90 tablet 0   fluticasone (FLONASE) 50 MCG/ACT nasal spray Place 1 spray into both nostrils in the morning and at bedtime. (Patient taking differently: Place 1 spray into both nostrils daily.) 16 g 5   furosemide (LASIX) 40 MG tablet Take 1 tablet (40 mg total) by mouth daily. (Patient taking differently: Take 40 mg by mouth daily as needed for edema.) 30 tablet 11   ipratropium (ATROVENT) 0.06 % nasal spray  Place 2 sprays into both nostrils 4 (four) times daily as needed (nasal drainage). (Patient taking differently: Place 2 sprays into both nostrils daily.) 15 mL 5   Magnesium Hydroxide (PHILLIPS MILK OF MAGNESIA PO) Take 15-30 mLs by mouth daily as needed (for constipation).     memantine (NAMENDA) 10 MG tablet Take 1 tablet by mouth twice daily 60 tablet 3   montelukast (SINGULAIR) 10 MG tablet Take 1 tablet (10 mg total) by mouth at bedtime. 30 tablet 5   ondansetron (ZOFRAN-ODT) 4 MG disintegrating tablet Take 1 tablet (4 mg total) by mouth every 8 (eight) hours as needed for nausea or vomiting. 10 tablet 0   pantoprazole (PROTONIX) 40 MG tablet Take 1 tablet by mouth once daily 90 tablet 2   potassium chloride SA (KLOR-CON M) 20 MEQ tablet Take 2 tablets (40 mEq total) by mouth daily as needed. Take potasium days you take lasix. 60 tablet 5   pravastatin (PRAVACHOL) 80 MG tablet Take 1 tablet by mouth once daily 90 tablet 0   SYMBICORT 80-4.5 MCG/ACT inhaler INHALE 2 PUFFS BY MOUTH IN THE MORNING AND AT BEDTIME (Patient taking differently: Inhale into the lungs See admin instructions. Inhale 2 puffs once a day, may take a second dose of 2 puffs when needed for shortness of breath) 10.2 g 5   levothyroxine (SYNTHROID) 50 MCG tablet TAKE 1 TABLET BY MOUTH ONCE DAILY BEFORE BREAKFAST (Patient not taking: Reported on 02/14/2023) 90 tablet 3   folic acid (FOLVITE) 1 MG tablet Take 1 tablet (1 mg total) by mouth daily. 90 tablet 0   Facility-Administered Medications Prior to Visit  Medication Dose Route Frequency Provider Last Rate Last Admin   Benralizumab SOSY 30 mg  30 mg Subcutaneous Q8 Weeks Marcelyn Bruins, MD   30 mg at 02/06/23 1041     Review of Systems:   Constitutional: No night sweats, fevers, chills, or lassitude. +fatigue, weight loss  HEENT: No headaches, difficulty swallowing, tooth/dental problems, or sore throat. No sneezing, itching, ear ache, nasal congestion, or post  nasal drip CV:  +LUE and breast edema. No chest pain, orthopnea, PND, swelling in lower extremities, dizziness, palpitations, syncope Resp: +baseline shortness of breath with exertion. No excess mucus or change in color of mucus. No productive or non-productive. No hemoptysis. No wheezing.  No chest wall deformity GI:  No heartburn, indigestion, abdominal pain, nausea, vomiting, diarrhea, change in bowel habits, loss of appetite, bloody stools.  GU: No dysuria, change in color of urine, urgency or frequency.   Skin: No rash, lesions, ulcerations. +erythema to left breast  MSK:  No joint pain or swelling.   Neuro: No dizziness  or lightheadedness.  Psych: No depression or anxiety. Mood stable.     Physical Exam:  BP 130/80 (BP Location: Right Arm, Cuff Size: Normal)   Pulse 67   Ht 5\' 5"  (1.651 m)   Wt 125 lb 6.4 oz (56.9 kg)   SpO2 100%   BMI 20.87 kg/m   GEN: Pleasant, interactive, well-kempt; elderly; in no acute distress. HEENT:  Normocephalic and atraumatic. PERRLA. Sclera white. Nasal turbinates pink, moist and patent bilaterally. No rhinorrhea present. Oropharynx pink and moist, without exudate or edema. No lesions, ulcerations, or postnasal drip.  NECK:  Supple w/ fair ROM. No JVD present. Normal carotid impulses w/o bruits. Thyroid symmetrical with no goiter or nodules palpated. No lymphadenopathy.   CV: RRR, no m/r/g. No lower extremity swelling. Pulses intact, +2 bilaterally. No cyanosis, pallor or clubbing.  LUE and left breast non pitting edema  PULMONARY:  Unlabored, regular breathing. Diminished LLL otherwise clear bilaterally A&P w/o wheezes/rales/rhonchi. No accessory muscle use.  GI: BS present and normoactive. Soft, non-tender to palpation. No organomegaly or masses detected.  MSK: Cap refil <2 sec all extrem.  Neuro: A/Ox3. No focal deficits noted.   Skin: Warm, no lesions or rashes. Erythema, warmth to left breast. No exudate  Psych: Normal affect and behavior.  Judgement and thought content appropriate.     Lab Results:  CBC    Component Value Date/Time   WBC 4.5 01/08/2023 1007   WBC 5.7 11/02/2022 1604   RBC 4.72 01/08/2023 1007   HGB 14.2 01/08/2023 1007   HGB 13.1 04/09/2017 1721   HCT 41.2 01/08/2023 1007   HCT 39.8 04/09/2017 1721   PLT 196 01/08/2023 1007   PLT 355 04/09/2017 1721   MCV 87.3 01/08/2023 1007   MCV 91 04/09/2017 1721   MCH 30.1 01/08/2023 1007   MCHC 34.5 01/08/2023 1007   RDW 15.2 01/08/2023 1007   RDW 14.0 04/09/2017 1721   LYMPHSABS 1.6 01/08/2023 1007   LYMPHSABS 3.6 (H) 04/09/2017 1721   MONOABS 0.4 01/08/2023 1007   EOSABS 0.0 01/08/2023 1007   EOSABS 0.7 (H) 04/09/2017 1721   BASOSABS 0.0 01/08/2023 1007   BASOSABS 0.0 04/09/2017 1721    BMET    Component Value Date/Time   NA 140 01/08/2023 1007   K 3.9 01/08/2023 1007   CL 106 01/08/2023 1007   CO2 29 01/08/2023 1007   GLUCOSE 94 01/08/2023 1007   BUN 11 01/08/2023 1007   CREATININE 0.86 01/08/2023 1007   CREATININE 0.86 11/02/2022 1604   CALCIUM 9.4 01/08/2023 1007   GFRNONAA >60 01/08/2023 1007   GFRAA >60 08/15/2017 0012    BNP    Component Value Date/Time   BNP 29.0 10/05/2022 1040     Imaging:  DG Chest 2 View  Result Date: 02/14/2023 CLINICAL DATA:  Bilateral effusions, worsening.  Left breast edema. EXAM: CHEST - 2 VIEW COMPARISON:  11/03/2022 FINDINGS: Right Port-A-Cath has been removed. Moderate left pleural effusion, increasing since prior study. No visible right effusion. Left base atelectasis. Right lung clear. Heart and mediastinal contours within normal limits. IMPRESSION: Moderate left pleural effusion.  Left base atelectasis. Electronically Signed   By: Charlett Nose M.D.   On: 02/14/2023 10:14    Benralizumab SOSY 30 mg     Date Action Dose Route User   02/06/2023 1041 Given 30 mg Subcutaneous (Left Arm) Fernandez-Vernon, Ashleigh R, CMA      heparin lock flush 100 unit/mL     Date Action Dose Route User  01/08/2023 1010 Given 500 Units Intracatheter [place], Maria L, LPN      sodium chloride flush (NS) 0.9 % injection 10 mL     Date Action Dose Route User   01/08/2023 1010 Given 10 mL Intracatheter Mungo, Maria L, LPN           No data to display          No results found for: "NITRICOXIDE"      Assessment & Plan:   Adenocarcinoma of left lung, stage 3 (HCC) Completed concurrent chemoradiation and consolidation therapy with Imfinzi, which was discontinued due to insurance cost. CT chest 12/2022 without evidence of recurrence. However, she has had new infiltrative mediastinal and subcutaneous edema that has progressively worsened and recurrent effusions. Minimal response to diuretics. She's also had close to a 20 lb weight loss. Concerned for lymphogenous spread. Discussed case with Dr. Delton Coombes. Recommended PET scan for further evaluation. Discussed with patient and daughter who verbalized understanding. Orders placed today. Will route results to oncology and determine next steps if evidence of metastatic disease or synchronous primary.   Patient Instructions  Continue Albuterol inhaler 2 puffs every 6 hours as needed for shortness of breath or wheezing. Notify if symptoms persist despite rescue inhaler/neb use.  Continue flonase nasal spray Continue singulair 1 tab daily Continue symbicort 2 puffs Twice daily. Brush tongue and rinse mouth afterwards  I am concerned about the fluid accumulation in the mediastinal area (area between your lungs) which is progressing into your left breast. We will obtain a PET scan to evaluate for spread of your cancer. Someone will contact you to schedule this   Your effusion has increased some since your last scan. I'll discuss this with Dr. Tonia Brooms. You will likely need a repeat thoracentesis at some point but your breathing is stable right now.   Follow up in 3-4 weeks with Dr. Tonia Brooms or Florentina Addison Jordynn Marcella,NP to discuss PET scan results. If we get these back  sooner, I will call you with next steps. If symptoms do not improve or worsen, please contact office for sooner follow up or seek emergency care.    Pleural effusion Recurrent left effusion. See above. Respiratory symptoms are stable. Will discuss with Dr. Tonia Brooms and decide on repeat thoracentesis with cytology.   Asthma, severe persistent, well-controlled Well-controlled and compensated on current regimen. No recent exacerbations. Action plan in place. Follow up with asthma/allergy  Mastodynia of left breast Unclear etiology. Possible secondary infection; completed multiple empiric abx. WBC count nl. See above.   I spent 45 minutes of dedicated to the care of this patient on the date of this encounter to include pre-visit review of records, face-to-face time with the patient discussing conditions above, post visit ordering of testing, clinical documentation with the electronic health record, making appropriate referrals as documented, and communicating necessary findings to members of the patients care team.  Noemi Chapel, NP 02/15/2023  Pt aware and understands NP's role.

## 2023-02-14 NOTE — Patient Instructions (Addendum)
Continue Albuterol inhaler 2 puffs every 6 hours as needed for shortness of breath or wheezing. Notify if symptoms persist despite rescue inhaler/neb use.  Continue flonase nasal spray Continue singulair 1 tab daily Continue symbicort 2 puffs Twice daily. Brush tongue and rinse mouth afterwards  I am concerned about the fluid accumulation in the mediastinal area (area between your lungs) which is progressing into your left breast. We will obtain a PET scan to evaluate for spread of your cancer. Someone will contact you to schedule this   Your effusion has increased some since your last scan. I'll discuss this with Dr. Tonia Brooms. You will likely need a repeat thoracentesis at some point but your breathing is stable right now.   Follow up in 3-4 weeks with Dr. Tonia Brooms or Angelica Addison Marquet Faircloth,NP to discuss PET scan results. If we get these back sooner, I will call you with next steps. If symptoms do not improve or worsen, please contact office for sooner follow up or seek emergency care.

## 2023-02-15 ENCOUNTER — Encounter: Payer: Self-pay | Admitting: Nurse Practitioner

## 2023-02-15 NOTE — Assessment & Plan Note (Addendum)
Completed concurrent chemoradiation and consolidation therapy with Imfinzi, which was discontinued due to insurance cost. CT chest 12/2022 without evidence of recurrence. However, she has had new infiltrative mediastinal and subcutaneous edema that has progressively worsened and recurrent effusions. Minimal response to diuretics. She's also had close to a 20 lb weight loss. Concerned for lymphogenous spread. Discussed case with Dr. Delton Coombes. Recommended PET scan for further evaluation. Discussed with patient and daughter who verbalized understanding. Orders placed today. Will route results to oncology and determine next steps if evidence of metastatic disease or synchronous primary.   Patient Instructions  Continue Albuterol inhaler 2 puffs every 6 hours as needed for shortness of breath or wheezing. Notify if symptoms persist despite rescue inhaler/neb use.  Continue flonase nasal spray Continue singulair 1 tab daily Continue symbicort 2 puffs Twice daily. Brush tongue and rinse mouth afterwards  I am concerned about the fluid accumulation in the mediastinal area (area between your lungs) which is progressing into your left breast. We will obtain a PET scan to evaluate for spread of your cancer. Someone will contact you to schedule this   Your effusion has increased some since your last scan. I'll discuss this with Dr. Tonia Brooms. You will likely need a repeat thoracentesis at some point but your breathing is stable right now.   Follow up in 3-4 weeks with Dr. Tonia Brooms or Florentina Addison Lashanta Elbe,NP to discuss PET scan results. If we get these back sooner, I will call you with next steps. If symptoms do not improve or worsen, please contact office for sooner follow up or seek emergency care.

## 2023-02-15 NOTE — Assessment & Plan Note (Signed)
Recurrent left effusion. See above. Respiratory symptoms are stable. Will discuss with Dr. Tonia Brooms and decide on repeat thoracentesis with cytology.

## 2023-02-15 NOTE — Assessment & Plan Note (Signed)
Well-controlled and compensated on current regimen. No recent exacerbations. Action plan in place. Follow up with asthma/allergy

## 2023-02-15 NOTE — Assessment & Plan Note (Signed)
Unclear etiology. Possible secondary infection; completed multiple empiric abx. WBC count nl. See above.

## 2023-02-16 ENCOUNTER — Encounter: Payer: Self-pay | Admitting: Internal Medicine

## 2023-02-16 NOTE — Assessment & Plan Note (Addendum)
Uncontrolled very recent worsening swelling with edema due to underlying comorbids,   No evidence for cellulitis, will hold on lasix for now given lack of other symptoms,  to f/u any worsening symptoms or concerns

## 2023-02-16 NOTE — Assessment & Plan Note (Signed)
Likely worse left sided - for pulm referral and cxr, suspect will need repeat thoracentesis soon

## 2023-02-16 NOTE — Assessment & Plan Note (Signed)
BP Readings from Last 3 Encounters:  02/14/23 130/80  02/13/23 130/80  01/10/23 (!) 140/88   Stable, pt to continue medical treatment  - diet, wt control

## 2023-02-16 NOTE — Assessment & Plan Note (Signed)
Suspect progressing recently based on increased edema, for f/u oncology as planned

## 2023-02-26 ENCOUNTER — Ambulatory Visit (HOSPITAL_COMMUNITY)
Admission: RE | Admit: 2023-02-26 | Discharge: 2023-02-26 | Disposition: A | Discharge status: Home / Self Care | Payer: Medicare HMO | Source: Ambulatory Visit | Attending: Nurse Practitioner | Admitting: Nurse Practitioner

## 2023-02-26 DIAGNOSIS — N6489 Other specified disorders of breast: Secondary | ICD-10-CM | POA: Insufficient documentation

## 2023-02-26 DIAGNOSIS — C3490 Malignant neoplasm of unspecified part of unspecified bronchus or lung: Secondary | ICD-10-CM | POA: Insufficient documentation

## 2023-02-26 DIAGNOSIS — C3492 Malignant neoplasm of unspecified part of left bronchus or lung: Secondary | ICD-10-CM | POA: Insufficient documentation

## 2023-02-26 LAB — GLUCOSE, CAPILLARY: Glucose-Capillary: 107 mg/dL — ABNORMAL HIGH (ref 70–99)

## 2023-02-26 MED ORDER — FLUDEOXYGLUCOSE F - 18 (FDG) INJECTION
6.2200 | Freq: Once | INTRAVENOUS | Status: AC
  Administered 2023-02-26: 6.22 via INTRAVENOUS

## 2023-02-28 ENCOUNTER — Inpatient Hospital Stay: Payer: Medicare HMO | Attending: Internal Medicine

## 2023-03-07 ENCOUNTER — Ambulatory Visit: Payer: Medicare HMO | Admitting: Pulmonary Disease

## 2023-03-07 ENCOUNTER — Encounter: Payer: Self-pay | Admitting: Pulmonary Disease

## 2023-03-07 VITALS — BP 128/84 | HR 72 | Temp 97.8°F | Ht 64.0 in | Wt 122.6 lb

## 2023-03-07 DIAGNOSIS — C3492 Malignant neoplasm of unspecified part of left bronchus or lung: Secondary | ICD-10-CM

## 2023-03-07 DIAGNOSIS — J4550 Severe persistent asthma, uncomplicated: Secondary | ICD-10-CM | POA: Diagnosis not present

## 2023-03-07 DIAGNOSIS — N63 Unspecified lump in unspecified breast: Secondary | ICD-10-CM | POA: Diagnosis not present

## 2023-03-07 NOTE — Progress Notes (Signed)
Synopsis: Referred in January 2022 for lung mass by [name], MD  Subjective:   PATIENT ID: Angelica [name] GENDER: female DOB: 06-Jun-1940, MRN: [id number]  Chief Complaint  Patient presents with   Follow-up    Breathing doing well.  C/o fatigue    This is an 82 year old female with a past medical history of bowel obstruction, hyperlipidemia, hypertension, thyroid disease, asthma, allergies on Fasenra [facility] by [facility].  Patient had a recent hospitalization, found to be COVID-positive, both vaccines plus booster.  She had a small bowel obstruction with abdominal pain nausea vomiting, incidental COVID infection.  Seen by general surgery underwent laparoscopy with lysis of adhesions during hospitalization had to be started on TPN.  Ultimately was discharged home.  During this evaluation patient found to have a left-sided lower lobe lung mass with associated mediastinal adenopathy.  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  Patient was referred at the time of discharge from the hospital for evaluation by pulmonary medicine for consideration of tissue diagnosis and biopsy after recovery from COVID-19.  At this time patient has no significant respiratory complaints has been doing well at home.  Patient accompanied today in the office by patient's daughter.  They are in agreements for consideration for tissue biopsy and bronchoscopy.  Patient denies fevers chills, denies hemoptysis.  She has been losing weight she does have poor appetite.  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  She has severe asthma at baseline on Symbicort plus Fasenra.  Respiratory status at this time relatively stable.  Bronchoscopy went well.  She has completed her  chemotherapy cycle plus radiation.  Office note from 08/08/2020, Dr. Shirline Frees reviewed today in clinic.  Patient's weight has been stable.  Eating well.   OV 03/16/2021: Doing well at this time.  No significant respiratory changes.  Follows with Dr. Shirline Frees.  Had recent CT imaging that revealed improvement in the lower lobe mass.  Not on immunotherapy at this time after admission for infection.  No significant breathing complaints today.  OV 05/03/2022: Here today for follow-up regarding asthma.  Currently on Symbicort plus Fasenra.  Has follow-up imaging already completed with Dr. Shirline Frees for medical oncology.  He sees her in a few weeks.  Still using her Flonase for nasal symptoms which is significantly improved.  Otherwise she is trying to stay active.  OV 03/07/2023: Here today for follow-up regarding asthma currently managed with Symbicort and Fasenra.  She is still doing well with this standpoint.  She still very weak.  Has not been taking her levothyroxine.  I sent a message over to her primary care provider Dr. Jonny Ruiz.    Past Medical History:  Diagnosis Date   Arthritis    Asthma    Bowel obstruction (HCC)    Cancer (HCC)    Environmental allergies    GERD (gastroesophageal reflux disease)    Glaucoma 05/09/2017   H. pylori infection    History of radiation therapy 07/05/20-08/05/20   Left lung IMRT Dr. Roselind Messier    HLD (hyperlipidemia) 01/16/2019   Hypertension    Iron deficiency anemia    Osteopenia 05/26/2017   Thyroid disease    Urticaria 07/25/2018     Family History  Problem Relation Age of Onset   Diabetes Mother  Hypertension Mother    Hypertension Father    Glaucoma Sister    Glaucoma Brother    Allergic rhinitis Neg Hx    Angioedema Neg Hx    Asthma Neg Hx    Eczema Neg Hx    Immunodeficiency Neg Hx    Urticaria Neg Hx      Past Surgical History:  Procedure Laterality Date   ABDOMINAL HYSTERECTOMY     BOWEL RESECTION N/A 05/23/2020   Procedure: SMALL BOWEL  REPAIR;  Surgeon: Kinsinger, De Blanch, MD;  Location: [facility] OR;  Service: General;  Laterality: N/A;   BREAST BIOPSY     BRONCHIAL BIOPSY  06/10/2020   Procedure: BRONCHIAL BIOPSIES;  Surgeon: [name], DO;  Location: [facility];  Service: Pulmonary;;   BRONCHIAL BRUSHINGS  06/10/2020   Procedure: BRONCHIAL BRUSHINGS;  Surgeon: [name], DO;  Location: [facility];  Service: Pulmonary;;   BRONCHIAL NEEDLE ASPIRATION BIOPSY  06/10/2020   Procedure: BRONCHIAL NEEDLE ASPIRATION BIOPSIES;  Surgeon: [name], DO;  Location: [facility];  Service: Pulmonary;;   BRONCHIAL WASHINGS  06/10/2020   Procedure: BRONCHIAL WASHINGS;  Surgeon: [name], DO;  Location: [facility];  Service: Pulmonary;;   COLON SURGERY     DIAGNOSTIC LARYNGOSCOPY N/A 05/23/2020   Procedure: ATTEMPTED DIAGNOSTIC LAPAROSCOPY WITH ADHESIONS;  Surgeon: Sheliah Hatch, De Blanch, MD;  Location: [facility];  Service: General;  Laterality: N/A;   IR IMAGING GUIDED PORT INSERTION  07/15/2020   IR REMOVAL TUN ACCESS W/ PORT W/O FL MOD SED  01/16/2023   KNEE SURGERY     LAPAROTOMY N/A 04/18/2015   Procedure: Exploratory laparotomy with lysis of adhesions, possible bowel resection;  Surgeon: Axel Filler, MD;  Location: [facility];  Service: General;  Laterality: N/A;   LAPAROTOMY N/A 05/23/2020   Procedure: EXPLORATORY LAPAROTOMY;  Surgeon: Rodman Pickle, MD;  Location: [facility];  Service: General;  Laterality: N/A;   LYSIS OF ADHESION N/A 05/23/2020   Procedure: LYSIS OF ADHESION;  Surgeon: Sheliah Hatch De Blanch, MD;  Location: [facility] OR;  Service: General;  Laterality: N/A;   NASAL SINUS SURGERY     THORACENTESIS Left 12/03/2022   Procedure: THORACENTESIS;  Surgeon: [name], DO;  Location: [facility];  Service: Pulmonary;  Laterality: Left;   THYROID SURGERY     VIDEO BRONCHOSCOPY WITH ENDOBRONCHIAL NAVIGATION Bilateral 06/10/2020   Procedure: VIDEO BRONCHOSCOPY WITH ENDOBRONCHIAL NAVIGATION;  Surgeon:  [name], DO;  Location: [facility];  Service: Pulmonary;  Laterality: Bilateral;   VIDEO BRONCHOSCOPY WITH ENDOBRONCHIAL ULTRASOUND  06/10/2020   Procedure: VIDEO BRONCHOSCOPY WITH ENDOBRONCHIAL ULTRASOUND;  Surgeon: [name], DO;  Location: [facility];  Service: Pulmonary;;    Social History   Socioeconomic History   Marital status: Widowed    Spouse name: Not on file   Number of children: 4   Years of education: 62   Highest education level: 11th grade  Occupational History   Occupation: retired  Tobacco Use   Smoking status: Former    Types: Cigarettes   Smokeless tobacco: Never   Tobacco comments:    quit 60 years ago  Vaping Use   Vaping status: Never Used  Substance and Sexual Activity   Alcohol use: No   Drug use: No   Sexual activity: Never  Other Topics Concern   Not on file  Social History Narrative   Right handed   Caffeine on occ   One story home   Social Determinants of Health  Financial Resource Strain: Low Risk  (10/02/2022)   Overall Financial Resource Strain (CARDIA)    Difficulty of Paying Living Expenses: Not hard at all  Food Insecurity: Patient Unable To Answer (10/05/2022)   Hunger Vital Sign    Worried About Running Out of Food in the Last Year: Patient unable to answer    Ran Out of Food in the Last Year: Patient unable to answer  Transportation Needs: No Transportation Needs (10/05/2022)   PRAPARE - Administrator, Civil Service (Medical): No    Lack of Transportation (Non-Medical): No  Physical Activity: Unknown (10/02/2022)   Exercise Vital Sign    Days of Exercise per Week: 0 days    Minutes of Exercise per Session: Not on file  Stress: No Stress Concern Present (10/02/2022)   [employer] of Occupational Health - Occupational Stress Questionnaire    Feeling of Stress : Not at all  Social Connections: Moderately Isolated (10/02/2022)   Social Connection and Isolation Panel [NHANES]    Frequency of  Communication with Friends and Family: More than three times a week    Frequency of Social Gatherings with Friends and Family: More than three times a week    Attends Religious Services: More than 4 times per year    Active Member of [employer] or Organizations: No    Attends Banker Meetings: Not on file    Marital Status: Widowed  Intimate Partner Violence: Not At Risk (10/05/2022)   Humiliation, Afraid, Rape, and Kick questionnaire    Fear of Current or Ex-Partner: No    Emotionally Abused: No    Physically Abused: No    Sexually Abused: No     Allergies  Allergen Reactions   Asa Buff (Mag [Buffered Aspirin] Other (See Comments)    Excessive sweating   Ensure [Nutritional Supplements]     Or boost - upset stomach    Fish Allergy Other (See Comments)    Unknown reaction, but allergic   Other Other (See Comments)    Gelcaps; gel-containing capsules; extended-release medication - upset stomach    Peanut-Containing Drug Products Other (See Comments)    From allergy test   Penicillins Itching   Shellfish-Derived Products Other (See Comments)    From allergy test   Strawberry Extract Nausea And Vomiting and Other (See Comments)    From allergy test      Outpatient Medications Prior to Visit  Medication Sig Dispense Refill   acetaminophen (TYLENOL) 500 MG tablet Take 1,000 mg by mouth every 6 (six) hours as needed for moderate pain.     albuterol (VENTOLIN HFA) 108 (90 Base) MCG/ACT inhaler Inhale 2 puffs into the lungs every 4 (four) hours as needed for wheezing or shortness of breath. 18 g 1   alum & mag hydroxide-simeth (MAALOX/MYLANTA) 200-200-20 MG/5ML suspension Take 15 mLs by mouth every 6 (six) hours as needed for indigestion or heartburn.     clopidogrel (PLAVIX) 75 MG tablet Take 1 tablet by mouth once daily 90 tablet 3   diclofenac Sodium (VOLTAREN) 1 % GEL Apply 4 g topically 4 (four) times daily. (Patient taking differently: Apply 4 g topically 4 (four) times  daily as needed (pain).) 100 g 0   EPINEPHrine (EPIPEN 2-PAK) 0.3 mg/0.3 mL IJ SOAJ injection Use as directed for severe allergic reaction 2 each 1   EQ ALLERGY RELIEF, CETIRIZINE, 10 MG tablet Take 1 tablet by mouth once daily 90 tablet 0   fluticasone (FLONASE) 50 MCG/ACT nasal  spray Place 1 spray into both nostrils in the morning and at bedtime. (Patient taking differently: Place 1 spray into both nostrils daily.) 16 g 5   furosemide (LASIX) 40 MG tablet Take 1 tablet (40 mg total) by mouth daily. (Patient taking differently: Take 40 mg by mouth daily as needed for edema.) 30 tablet 11   ipratropium (ATROVENT) 0.06 % nasal spray Place 2 sprays into both nostrils 4 (four) times daily as needed (nasal drainage). (Patient taking differently: Place 2 sprays into both nostrils daily.) 15 mL 5   levothyroxine (SYNTHROID) 50 MCG tablet TAKE 1 TABLET BY MOUTH ONCE DAILY BEFORE BREAKFAST 90 tablet 3   Magnesium Hydroxide (PHILLIPS MILK OF MAGNESIA PO) Take 15-30 mLs by mouth daily as needed (for constipation).     memantine (NAMENDA) 10 MG tablet Take 1 tablet by mouth twice daily 60 tablet 3   montelukast (SINGULAIR) 10 MG tablet Take 1 tablet (10 mg total) by mouth at bedtime. 30 tablet 5   ondansetron (ZOFRAN-ODT) 4 MG disintegrating tablet Take 1 tablet (4 mg total) by mouth every 8 (eight) hours as needed for nausea or vomiting. 10 tablet 0   pantoprazole (PROTONIX) 40 MG tablet Take 1 tablet by mouth once daily 90 tablet 2   potassium chloride SA (KLOR-CON M) 20 MEQ tablet Take 2 tablets (40 mEq total) by mouth daily as needed. Take potasium days you take lasix. 60 tablet 5   pravastatin (PRAVACHOL) 80 MG tablet Take 1 tablet by mouth once daily 90 tablet 0   SYMBICORT 80-4.5 MCG/ACT inhaler INHALE 2 PUFFS BY MOUTH IN THE MORNING AND AT BEDTIME (Patient taking differently: Inhale into the lungs See admin instructions. Inhale 2 puffs once a day, may take a second dose of 2 puffs when needed for  shortness of breath) 10.2 g 5   Facility-Administered Medications Prior to Visit  Medication Dose Route Frequency Provider Last Rate Last Admin   Benralizumab SOSY 30 mg  30 mg Subcutaneous Q8 Weeks Marcelyn Bruins, MD   30 mg at 02/06/23 1041    Review of Systems  Constitutional:  Positive for malaise/fatigue and weight loss. Negative for chills and fever.  HENT:  Negative for hearing loss, sore throat and tinnitus.   Eyes:  Negative for blurred vision and double vision.  Respiratory:  Negative for cough, hemoptysis, sputum production, shortness of breath, wheezing and stridor.   Cardiovascular:  Negative for chest pain, palpitations, orthopnea, leg swelling and PND.  Gastrointestinal:  Negative for abdominal pain, constipation, diarrhea, heartburn, nausea and vomiting.  Genitourinary:  Negative for dysuria, hematuria and urgency.  Musculoskeletal:  Negative for joint pain and myalgias.  Skin:  Negative for itching and rash.  Neurological:  Negative for dizziness, tingling, weakness and headaches.  Endo/Heme/Allergies:  Negative for environmental allergies. Does not bruise/bleed easily.  Psychiatric/Behavioral:  Negative for depression. The patient is not nervous/anxious and does not have insomnia.   All other systems reviewed and are negative.    Objective:  Physical Exam Vitals reviewed.  Constitutional:      General: She is not in acute distress.    Appearance: She is well-developed.     Comments: Thin cachectic  HENT:     Head: Normocephalic and atraumatic.  Eyes:     General: No scleral icterus.    Conjunctiva/sclera: Conjunctivae normal.     Pupils: Pupils are equal, round, and reactive to light.  Neck:     Vascular: No JVD.     Trachea:  No tracheal deviation.  Cardiovascular:     Rate and Rhythm: Normal rate and regular rhythm.     Heart sounds: Normal heart sounds. No murmur heard.    Comments: Left-sided unilateral breast swelling Pulmonary:      Effort: Pulmonary effort is normal. No tachypnea, accessory muscle usage or respiratory distress.     Breath sounds: No stridor. No wheezing, rhonchi or rales.  Abdominal:     General: There is no distension.     Palpations: Abdomen is soft.     Tenderness: There is no abdominal tenderness.  Musculoskeletal:        General: No tenderness.     Cervical back: Neck supple.  Lymphadenopathy:     Cervical: No cervical adenopathy.  Skin:    General: Skin is warm and dry.     Capillary Refill: Capillary refill takes less than 2 seconds.     Findings: No rash.  Neurological:     Mental Status: She is alert and oriented to person, place, and time.  Psychiatric:        Behavior: Behavior normal.      Vitals:   03/07/23 1314  BP: 128/84  Pulse: 72  Temp: 97.8 F (36.6 C)  TempSrc: Temporal  SpO2: 98%  Weight: 122 lb 9.6 oz (55.6 kg)  Height: 5\' 4"  (1.626 m)   98% on RA BMI Readings from Last 3 Encounters:  03/07/23 21.04 kg/m  02/14/23 20.87 kg/m  02/13/23 23.23 kg/m   Wt Readings from Last 3 Encounters:  03/07/23 122 lb 9.6 oz (55.6 kg)  02/14/23 125 lb 6.4 oz (56.9 kg)  02/13/23 127 lb (57.6 kg)     CBC    Component Value Date/Time   WBC 4.5 01/08/2023 1007   WBC 5.7 11/02/2022 1604   RBC 4.72 01/08/2023 1007   HGB 14.2 01/08/2023 1007   HGB 13.1 04/09/2017 1721   HCT 41.2 01/08/2023 1007   HCT 39.8 04/09/2017 1721   PLT 196 01/08/2023 1007   PLT 355 04/09/2017 1721   MCV 87.3 01/08/2023 1007   MCV 91 04/09/2017 1721   MCH 30.1 01/08/2023 1007   MCHC 34.5 01/08/2023 1007   RDW 15.2 01/08/2023 1007   RDW 14.0 04/09/2017 1721   LYMPHSABS 1.6 01/08/2023 1007   LYMPHSABS 3.6 (H) 04/09/2017 1721   MONOABS 0.4 01/08/2023 1007   EOSABS 0.0 01/08/2023 1007   EOSABS 0.7 (H) 04/09/2017 1721   BASOSABS 0.0 01/08/2023 1007   BASOSABS 0.0 04/09/2017 1721    Chest Imaging: CT chest 2020-05-17: Patient found to have left lower lobe 3 cm lung mass with associated  mediastinal adenopathy concerning for primary bronchogenic carcinoma and advanced age. Images reviewed today in the office.The patient's images have been independently reviewed by me.    02/28/2021 CT chest: Dramatic improvement of the left lower lobe lesion, radiation fibrosis. No significant hilar adenopathy. The patient's images have been independently reviewed by me.  .  CT chest 04/10/2022: Signs of bronchiectasis pleural and parenchymal scarring in the left hilum related to previous treatments.  Large nodule along the pleural surface in the right chest and right upper lobe along the minor fissure indeterminate given morphology.  May consider PET scan.  Felt for need for short-term image follow-up. The patient's images have been independently reviewed by me.     Pulmonary Functions Testing Results:     No data to display          FeNO:   Pathology:  06/10/2020:  FINAL MICROSCOPIC DIAGNOSIS:  D. LYMPH NODE, 7 NODE, FINE NEEDLE ASPIRATION:  - Malignant cells consistent with non-small cell carcinoma. The patient's path have been reviewed by me.    Echocardiogram:   Heart Catheterization:    Assessment & Plan:     ICD-10-CM   1. Breast swelling  N63.0 Ambulatory Referral for Breast Surgery    2. Adenocarcinoma of left lung, stage 3 (HCC)  C34.92     3. Asthma, severe persistent, well-controlled  J45.50       Discussion:  82 year old female left-sided unilateral breast swelling has a history of left upper lobe mass with mediastinal adenopathy status post bronchoscopy biopsy diagnosed with stage IIIb adenocarcinoma status post chemoradiation.  Also has severe asthma on Symbicort plus Fasenra and as needed albuterol.  Had a repeat staging lung cancer CT with pet imaging.  Just shows unilateral breast swelling.  Some abnormal uptake within the colon likely scattered and noncontributory.  Plan: Continue follow-up with medical oncology as scheduled. Continue Fasenra  plus Symbicort plus as needed albuterol Continue follow-up with asthma and allergy. Referral placed to surgery for evaluation of the left breast.  She has had multiple images ultrasounds.  And is just looking for a second opinion on what to do next for the swelling left breast as it starting to be uncomfortable for her. RTC in 6 months.   Current Outpatient Medications:    acetaminophen (TYLENOL) 500 MG tablet, Take 1,000 mg by mouth every 6 (six) hours as needed for moderate pain., Disp: , Rfl:    albuterol (VENTOLIN HFA) 108 (90 Base) MCG/ACT inhaler, Inhale 2 puffs into the lungs every 4 (four) hours as needed for wheezing or shortness of breath., Disp: 18 g, Rfl: 1   alum & mag hydroxide-simeth (MAALOX/MYLANTA) 200-200-20 MG/5ML suspension, Take 15 mLs by mouth every 6 (six) hours as needed for indigestion or heartburn., Disp: , Rfl:    clopidogrel (PLAVIX) 75 MG tablet, Take 1 tablet by mouth once daily, Disp: 90 tablet, Rfl: 3   diclofenac Sodium (VOLTAREN) 1 % GEL, Apply 4 g topically 4 (four) times daily. (Patient taking differently: Apply 4 g topically 4 (four) times daily as needed (pain).), Disp: 100 g, Rfl: 0   EPINEPHrine (EPIPEN 2-PAK) 0.3 mg/0.3 mL IJ SOAJ injection, Use as directed for severe allergic reaction, Disp: 2 each, Rfl: 1   EQ ALLERGY RELIEF, CETIRIZINE, 10 MG tablet, Take 1 tablet by mouth once daily, Disp: 90 tablet, Rfl: 0   fluticasone (FLONASE) 50 MCG/ACT nasal spray, Place 1 spray into both nostrils in the morning and at bedtime. (Patient taking differently: Place 1 spray into both nostrils daily.), Disp: 16 g, Rfl: 5   furosemide (LASIX) 40 MG tablet, Take 1 tablet (40 mg total) by mouth daily. (Patient taking differently: Take 40 mg by mouth daily as needed for edema.), Disp: 30 tablet, Rfl: 11   ipratropium (ATROVENT) 0.06 % nasal spray, Place 2 sprays into both nostrils 4 (four) times daily as needed (nasal drainage). (Patient taking differently: Place 2 sprays  into both nostrils daily.), Disp: 15 mL, Rfl: 5   levothyroxine (SYNTHROID) 50 MCG tablet, TAKE 1 TABLET BY MOUTH ONCE DAILY BEFORE BREAKFAST, Disp: 90 tablet, Rfl: 3   Magnesium Hydroxide (PHILLIPS MILK OF MAGNESIA PO), Take 15-30 mLs by mouth daily as needed (for constipation)., Disp: , Rfl:    memantine (NAMENDA) 10 MG tablet, Take 1 tablet by mouth twice daily, Disp: 60 tablet, Rfl: 3   montelukast (  SINGULAIR) 10 MG tablet, Take 1 tablet (10 mg total) by mouth at bedtime., Disp: 30 tablet, Rfl: 5   ondansetron (ZOFRAN-ODT) 4 MG disintegrating tablet, Take 1 tablet (4 mg total) by mouth every 8 (eight) hours as needed for nausea or vomiting., Disp: 10 tablet, Rfl: 0   pantoprazole (PROTONIX) 40 MG tablet, Take 1 tablet by mouth once daily, Disp: 90 tablet, Rfl: 2   potassium chloride SA (KLOR-CON M) 20 MEQ tablet, Take 2 tablets (40 mEq total) by mouth daily as needed. Take potasium days you take lasix., Disp: 60 tablet, Rfl: 5   pravastatin (PRAVACHOL) 80 MG tablet, Take 1 tablet by mouth once daily, Disp: 90 tablet, Rfl: 0   SYMBICORT 80-4.5 MCG/ACT inhaler, INHALE 2 PUFFS BY MOUTH IN THE MORNING AND AT BEDTIME (Patient taking differently: Inhale into the lungs See admin instructions. Inhale 2 puffs once a day, may take a second dose of 2 puffs when needed for shortness of breath), Disp: 10.2 g, Rfl: 5  Current Facility-Administered Medications:    Benralizumab SOSY 30 mg, 30 mg, Subcutaneous, Q8 Weeks, Marcelyn Bruins, MD, 30 mg at 02/06/23 1041    [name], DO [facility] 03/07/2023 1:25 PM

## 2023-03-07 NOTE — Patient Instructions (Signed)
Thank you for visiting Dr. Tonia Brooms at [facility]. Today we recommend the following:  Return in about 6 months (around 09/05/2023) for with APP or Dr. Tonia Brooms.    Please do your part to reduce the spread of COVID-19.

## 2023-03-15 ENCOUNTER — Other Ambulatory Visit: Payer: Self-pay | Admitting: Allergy

## 2023-03-15 ENCOUNTER — Other Ambulatory Visit: Payer: Self-pay | Admitting: Neurology

## 2023-03-19 DIAGNOSIS — I890 Lymphedema, not elsewhere classified: Secondary | ICD-10-CM | POA: Diagnosis not present

## 2023-03-20 ENCOUNTER — Ambulatory Visit: Payer: Medicare HMO | Attending: Surgery | Admitting: Physical Therapy

## 2023-03-20 ENCOUNTER — Encounter: Payer: Self-pay | Admitting: Physical Therapy

## 2023-03-20 ENCOUNTER — Other Ambulatory Visit: Payer: Self-pay

## 2023-03-20 DIAGNOSIS — I890 Lymphedema, not elsewhere classified: Secondary | ICD-10-CM | POA: Insufficient documentation

## 2023-03-20 DIAGNOSIS — L599 Disorder of the skin and subcutaneous tissue related to radiation, unspecified: Secondary | ICD-10-CM | POA: Insufficient documentation

## 2023-03-20 NOTE — Therapy (Signed)
OUTPATIENT PHYSICAL THERAPY  UPPER EXTREMITY ONCOLOGY EVALUATION  Patient Name: Angelica [name] MRN: [id number] DOB:11/09/1940, 82 y.o., female Today's Date: 03/20/2023  END OF SESSION:  PT End of Session - 03/20/23 0901     Visit Number 1    Number of Visits 9    Date for PT Re-Evaluation 04/17/23    PT Start Time 0802    PT Stop Time 0845    PT Time Calculation (min) 43 min    Activity Tolerance Patient tolerated treatment well    Behavior During Therapy [facility] for tasks assessed/performed             Past Medical History:  Diagnosis Date   Arthritis    Asthma    Bowel obstruction (HCC)    Cancer (HCC)    Environmental allergies    GERD (gastroesophageal reflux disease)    Glaucoma 05/09/2017   H. pylori infection    History of radiation therapy 07/05/20-08/05/20   Left lung IMRT Dr. Roselind Messier    HLD (hyperlipidemia) 01/16/2019   Hypertension    Iron deficiency anemia    Osteopenia 05/26/2017   Thyroid disease    Urticaria 07/25/2018   Past Surgical History:  Procedure Laterality Date   ABDOMINAL HYSTERECTOMY     BOWEL RESECTION N/A 05/23/2020   Procedure: SMALL BOWEL REPAIR;  Surgeon: Sheliah Hatch De Blanch, MD;  Location: [facility] OR;  Service: General;  Laterality: N/A;   BREAST BIOPSY     BRONCHIAL BIOPSY  06/10/2020   Procedure: BRONCHIAL BIOPSIES;  Surgeon: [name], DO;  Location: [facility] ENDOSCOPY;  Service: Pulmonary;;   BRONCHIAL BRUSHINGS  06/10/2020   Procedure: BRONCHIAL BRUSHINGS;  Surgeon: [name], DO;  Location: [facility];  Service: Pulmonary;;   BRONCHIAL NEEDLE ASPIRATION BIOPSY  06/10/2020   Procedure: BRONCHIAL NEEDLE ASPIRATION BIOPSIES;  Surgeon: [name], DO;  Location: [facility];  Service: Pulmonary;;   BRONCHIAL WASHINGS  06/10/2020   Procedure: BRONCHIAL WASHINGS;  Surgeon: [name], DO;  Location: [facility];  Service: Pulmonary;;   COLON SURGERY     DIAGNOSTIC LARYNGOSCOPY N/A 05/23/2020   Procedure: ATTEMPTED  DIAGNOSTIC LAPAROSCOPY WITH ADHESIONS;  Surgeon: Sheliah Hatch, De Blanch, MD;  Location: [facility] OR;  Service: General;  Laterality: N/A;   IR IMAGING GUIDED PORT INSERTION  07/15/2020   IR REMOVAL TUN ACCESS W/ PORT W/O FL MOD SED  01/16/2023   KNEE SURGERY     LAPAROTOMY N/A 04/18/2015   Procedure: Exploratory laparotomy with lysis of adhesions, possible bowel resection;  Surgeon: Axel Filler, MD;  Location: [facility] OR;  Service: General;  Laterality: N/A;   LAPAROTOMY N/A 05/23/2020   Procedure: EXPLORATORY LAPAROTOMY;  Surgeon: Rodman Pickle, MD;  Location: [facility] OR;  Service: General;  Laterality: N/A;   LYSIS OF ADHESION N/A 05/23/2020   Procedure: LYSIS OF ADHESION;  Surgeon: Sheliah Hatch De Blanch, MD;  Location: [facility] OR;  Service: General;  Laterality: N/A;   NASAL SINUS SURGERY     THORACENTESIS Left 12/03/2022   Procedure: THORACENTESIS;  Surgeon: [name], DO;  Location: [facility];  Service: Pulmonary;  Laterality: Left;   THYROID SURGERY     VIDEO BRONCHOSCOPY WITH ENDOBRONCHIAL NAVIGATION Bilateral 06/10/2020   Procedure: VIDEO BRONCHOSCOPY WITH ENDOBRONCHIAL NAVIGATION;  Surgeon: [name], DO;  Location: [facility];  Service: Pulmonary;  Laterality: Bilateral;   VIDEO BRONCHOSCOPY WITH ENDOBRONCHIAL ULTRASOUND  06/10/2020   Procedure: VIDEO BRONCHOSCOPY WITH ENDOBRONCHIAL ULTRASOUND;  Surgeon: [name], DO;  Location: [facility]  ENDOSCOPY;  Service: Pulmonary;;   Patient Active Problem List   Diagnosis Date Noted   Mastodynia of left breast 02/13/2023   Pleural effusion 11/21/2022   Swelling of breast 11/17/2022   Dyspnea 11/14/2022   Cellulitis of left breast 10/17/2022   Iron deficiency anemia 10/11/2022   Acute respiratory failure with hypoxia (HCC) 10/05/2022   Bilateral pleural effusion 10/05/2022   History of stroke 10/05/2022   Weakness 10/05/2022   Edema 10/05/2022   Alzheimer's disease (HCC) 09/06/2022   Ex-smoker 09/06/2022   Hypothyroidism 09/06/2022    Right leg pain 07/13/2021   Dementia (HCC) 07/13/2021   RLQ abdominal pain 07/13/2021   Encounter for well adult exam with abnormal findings 06/10/2021   Anosmia 05/01/2021   Chronic ethmoidal sinusitis 02/14/2021   Debility 02/01/2021   Gait disorder 02/01/2021   Malignant neoplasm of bronchus and lung (HCC)    Port-A-Cath in place 11/02/2020   Adenocarcinoma of left lung, stage 3 (HCC) 06/22/2020   Lung mass    Pulmonary nodule 05/17/2020   Iron deficiency 05/03/2020   Swelling of lower extremity 07/30/2019   Ganglion cyst of right foot 01/16/2019   HLD (hyperlipidemia) 01/16/2019   LPRD (laryngopharyngeal reflux disease) 07/25/2018   Constipation 08/19/2017   Asthma, severe persistent, well-controlled 06/04/2017   Chronic pansinusitis 06/04/2017   Nasal polyposis 06/04/2017   Osteopenia 05/26/2017   GERD (gastroesophageal reflux disease)    H. pylori infection    Other allergic rhinitis 03/20/2016   Malnutrition of moderate degree 04/11/2015   HTN (hypertension) 04/10/2015    PCP: Oliver Barre, MD  REFERRING PROVIDER: Harriette Bouillon, MD  REFERRING DIAG: I89.0 (ICD-10-CM) - Lymphedema, not elsewhere classified  THERAPY DIAG:  Lymphedema, not elsewhere classified  Disorder of the skin and subcutaneous tissue related to radiation, unspecified  ONSET DATE: pt unable to give a date - "it has been a while"  Rationale for Evaluation and Treatment: Rehabilitation  SUBJECTIVE:                                                                                                                                                                                           SUBJECTIVE STATEMENT: My breast doesn't hurt it is just swollen.   PERTINENT HISTORY: history of non-small cell lung cancer with most recent CT 10/05/2022 demonstrating diffuse anasarca, as well as bilateral pleural effusions, hx of radiation to chest,  hx of breast biopsy (benign), dementia and Alzheimer's    PAIN:  Are you having pain? No  PRECAUTIONS: Other: chart states pt has Alzheimer's/dementia - pt unable to verify diagnosis in chart, no one else present with pt to assist  RED FLAGS: Unable to ask as pt has memory issues and no one present at appointment  WEIGHT BEARING RESTRICTIONS: No  FALLS:  Has patient fallen in last 6 months? No  LIVING ENVIRONMENT: Lives with: lives with their daughter Lives in: House/apartment Has following equipment at home: Dan Humphreys - 4 wheeled pt reports she does not use it  OCCUPATION: retired  LEISURE: pt reports she tries to walk  HAND DOMINANCE: right   PRIOR LEVEL OF FUNCTION: Independent does have memory issues  PATIENT GOALS: pt reports she does not know   OBJECTIVE: Note: Objective measures were completed at Evaluation unless otherwise noted.  COGNITION: Overall cognitive status: Within functional limits for tasks assessed   PALPATION: Fibrosis present in inferior L breast  OBSERVATIONS / OTHER ASSESSMENTS: L significantly larger than R breast, approximately double the size  POSTURE: forward head, rounded shoulders  UPPER EXTREMITY AROM/PROM: WFL  CERVICAL AROM: All within normal limits:    Percent limited  Flexion WFL  Extension 75% limited  Right lateral flexion WFL  Left lateral flexion 25% limited  Right rotation WFL  Left rotation WFL    LYMPHEDEMA ASSESSMENTS:   LANDMARK RIGHT  eval  At axilla  28.5  15 cm proximal to olecranon process 25.6  10 cm proximal to olecranon process 25.5  Olecranon process 22.5  15 cm proximal to ulnar styloid process 22.1  10 cm proximal to ulnar styloid process 18.5  Just proximal to ulnar styloid process 14.5  Across hand at thumb web space 18.8  At base of 2nd digit 6  (Blank rows = not tested)  LANDMARK LEFT  eval  At axilla  28.5  15 cm proximal to olecranon process 26.5  10 cm proximal to olecranon process 25.9  Olecranon process 24  15 cm proximal to ulnar  styloid process 21.5  10 cm proximal to ulnar styloid process 18.1  Just proximal to ulnar styloid process 14.5  Across hand at thumb web space 18.2  At base of 2nd digit 5.6  (Blank rows = not tested)  LLIS:  14.71  TODAY'S TREATMENT:                                                                                                                                          DATE:  03/20/23: None today as pt was unaccompanied and has dementia, will begin at next session when her daughter can be here    PATIENT EDUCATION:  Education details: lymphedema and how it is managed with manual lymphatic drainage and compression garments Person educated: Patient Education method: Explanation and Handouts Education comprehension: verbalized understanding  HOME EXERCISE PROGRAM: Obtain compression bra  ASSESSMENT:  CLINICAL IMPRESSION: Patient is a 82 y.o. female who was seen today for physical therapy evaluation and treatment for L breast lymphedema. Pt has dementia and had difficulty answering questions regarding the length of time she has  been having swelling. Her left breast is about twice the size of her R breast and she has increased fibrosis in her inferior and medial breast. She would benefit from skilled PT services to decrease L breast lymphedema and educate pt's daughters in MLD technique as well as ensure pt obtains a compression bra for long term management of lymphedema.    OBJECTIVE IMPAIRMENTS: decreased knowledge of condition, decreased knowledge of use of DME, increased edema, and postural dysfunction.   ACTIVITY LIMITATIONS:  none  PARTICIPATION LIMITATIONS:  none  PERSONAL FACTORS: Time since onset of injury/illness/exacerbation and 1 comorbidity: dementia  are also affecting patient's functional outcome.   REHAB POTENTIAL: Good  CLINICAL DECISION MAKING: Stable/uncomplicated  EVALUATION COMPLEXITY: Low  GOALS: Goals reviewed with patient? Yes  SHORT TERM GOALS=LONG  TERM GOALS Target date: 04/17/23  Pt will obtain a compression bra for long term management of L breast lymphedema.  Baseline: Goal status: INITIAL  2.  Pt's daughters will be independent in breast MLD for long term management of lymphedema.  Baseline:  Goal status: INITIAL  3.  Pt will demonstrate a 50% improvement in fibrosis in L breast to decrease risk of infection.  Baseline:  Goal status: INITIAL  PLAN:  PT FREQUENCY: 2x/week  PT DURATION: 4 weeks  PLANNED INTERVENTIONS: 97140- Manual therapy, [id number]- Orthotic Fit/training, Patient/Family education, Therapeutic exercises, Therapeutic activity, and Self Care  PLAN FOR NEXT SESSION: begin MLD to L breast and instruct daughter, did she get a compression bra  Leonette Most, PT 03/20/2023, 9:35 AM

## 2023-04-02 ENCOUNTER — Ambulatory Visit: Payer: Medicare HMO | Attending: Surgery

## 2023-04-02 DIAGNOSIS — I890 Lymphedema, not elsewhere classified: Secondary | ICD-10-CM | POA: Diagnosis not present

## 2023-04-02 DIAGNOSIS — L599 Disorder of the skin and subcutaneous tissue related to radiation, unspecified: Secondary | ICD-10-CM | POA: Diagnosis not present

## 2023-04-02 NOTE — Therapy (Signed)
OUTPATIENT PHYSICAL THERAPY  UPPER EXTREMITY ONCOLOGY TREATMENT  Patient Name: Angelica [name] MRN: [id number] DOB:1940-11-14, 82 y.o., female Today's Date: 04/02/2023  END OF SESSION:  PT End of Session - 04/02/23 1209     Visit Number 2    Number of Visits 9    Date for PT Re-Evaluation 04/17/23    PT Start Time 1205    PT Stop Time 1300    PT Time Calculation (min) 55 min    Activity Tolerance Patient tolerated treatment well    Behavior During Therapy [facility] for tasks assessed/performed             Past Medical History:  Diagnosis Date   Arthritis    Asthma    Bowel obstruction (HCC)    Cancer (HCC)    Environmental allergies    GERD (gastroesophageal reflux disease)    Glaucoma 05/09/2017   H. pylori infection    History of radiation therapy 07/05/20-08/05/20   Left lung IMRT Dr. Roselind Messier    HLD (hyperlipidemia) 01/16/2019   Hypertension    Iron deficiency anemia    Osteopenia 05/26/2017   Thyroid disease    Urticaria 07/25/2018   Past Surgical History:  Procedure Laterality Date   ABDOMINAL HYSTERECTOMY     BOWEL RESECTION N/A 05/23/2020   Procedure: SMALL BOWEL REPAIR;  Surgeon: Sheliah Hatch De Blanch, MD;  Location: [facility] OR;  Service: General;  Laterality: N/A;   BREAST BIOPSY     BRONCHIAL BIOPSY  06/10/2020   Procedure: BRONCHIAL BIOPSIES;  Surgeon: [name], DO;  Location: [facility] ENDOS[facility];  Service: Pulmonary;;   BRONCHIAL BRUSHINGS  06/10/2020   Procedure: BRONCHIAL BRUSHINGS;  Surgeon: [name], DO;  Location: [facility];  Service: Pulmonary;;   BRONCHIAL NEEDLE ASPIRATION BIOPSY  06/10/2020   Procedure: BRONCHIAL NEEDLE ASPIRATION BIOPSIES;  Surgeon: [name], DO;  Location: [facility];  Service: Pulmonary;;   BRONCHIAL WASHINGS  06/10/2020   Procedure: BRONCHIAL WASHINGS;  Surgeon: [name], DO;  Location: [facility];  Service: Pulmonary;;   COLON SURGERY     DIAGNOSTIC LARYNGOSCOPY N/A 05/23/2020   Procedure: ATTEMPTED  DIAGNOSTIC LAPAROSCOPY WITH ADHESIONS;  Surgeon: Sheliah Hatch, De Blanch, MD;  Location: [facility] OR;  Service: General;  Laterality: N/A;   IR IMAGING GUIDED PORT INSERTION  07/15/2020   IR REMOVAL TUN ACCESS W/ PORT W/O FL MOD SED  01/16/2023   KNEE SURGERY     LAPAROTOMY N/A 04/18/2015   Procedure: Exploratory laparotomy with lysis of adhesions, possible bowel resection;  Surgeon: Axel Filler, MD;  Location: [facility];  Service: General;  Laterality: N/A;   LAPAROTOMY N/A 05/23/2020   Procedure: EXPLORATORY LAPAROTOMY;  Surgeon: Rodman Pickle, MD;  Location: [facility] OR;  Service: General;  Laterality: N/A;   LYSIS OF ADHESION N/A 05/23/2020   Procedure: LYSIS OF ADHESION;  Surgeon: Sheliah Hatch De Blanch, MD;  Location: [facility] OR;  Service: General;  Laterality: N/A;   NASAL SINUS SURGERY     THORACENTESIS Left 12/03/2022   Procedure: THORACENTESIS;  Surgeon: [name], DO;  Location: [facility];  Service: Pulmonary;  Laterality: Left;   THYROID SURGERY     VIDEO BRONCHOSCOPY WITH ENDOBRONCHIAL NAVIGATION Bilateral 06/10/2020   Procedure: VIDEO BRONCHOSCOPY WITH ENDOBRONCHIAL NAVIGATION;  Surgeon: [name], DO;  Location: [facility];  Service: Pulmonary;  Laterality: Bilateral;   VIDEO BRONCHOSCOPY WITH ENDOBRONCHIAL ULTRASOUND  06/10/2020   Procedure: VIDEO BRONCHOSCOPY WITH ENDOBRONCHIAL ULTRASOUND;  Surgeon: [name], DO;  Location: [facility]  ENDOS[facility];  Service: Pulmonary;;   Patient Active Problem List   Diagnosis Date Noted   Mastodynia of left breast 02/13/2023   Pleural effusion 11/21/2022   Swelling of breast 11/17/2022   Dyspnea 11/14/2022   Cellulitis of left breast 10/17/2022   Iron deficiency anemia 10/11/2022   Acute respiratory failure with hypoxia (HCC) 10/05/2022   Bilateral pleural effusion 10/05/2022   History of stroke 10/05/2022   Weakness 10/05/2022   Edema 10/05/2022   Alzheimer's disease (HCC) 09/06/2022   Ex-smoker 09/06/2022   Hypothyroidism 09/06/2022    Right leg pain 07/13/2021   Dementia (HCC) 07/13/2021   RLQ abdominal pain 07/13/2021   Encounter for well adult exam with abnormal findings 06/10/2021   Anosmia 05/01/2021   Chronic ethmoidal sinusitis 02/14/2021   Debility 02/01/2021   Gait disorder 02/01/2021   Malignant neoplasm of bronchus and lung (HCC)    Port-A-Cath in place 11/02/2020   Adenocarcinoma of left lung, stage 3 (HCC) 06/22/2020   Lung mass    Pulmonary nodule 05/17/2020   Iron deficiency 05/03/2020   Swelling of lower extremity 07/30/2019   Ganglion cyst of right foot 01/16/2019   HLD (hyperlipidemia) 01/16/2019   LPRD (laryngopharyngeal reflux disease) 07/25/2018   Constipation 08/19/2017   Asthma, severe persistent, well-controlled 06/04/2017   Chronic pansinusitis 06/04/2017   Nasal polyposis 06/04/2017   Osteopenia 05/26/2017   GERD (gastroesophageal reflux disease)    H. pylori infection    Other allergic rhinitis 03/20/2016   Malnutrition of moderate degree 04/11/2015   HTN (hypertension) 04/10/2015    PCP: Oliver Barre, MD  REFERRING PROVIDER: Harriette Bouillon, MD  REFERRING DIAG: I89.0 (ICD-10-CM) - Lymphedema, not elsewhere classified  THERAPY DIAG:  Lymphedema, not elsewhere classified  Disorder of the skin and subcutaneous tissue related to radiation, unspecified  ONSET DATE: pt unable to give a date - "it has been a while"  Rationale for Evaluation and Treatment: Rehabilitation  SUBJECTIVE:                                                                                                                                                                                           SUBJECTIVE STATEMENT: I'm ready to get my breast smaller.  My daughter got me appt [facility] for December after multiple times of trying to get me the appt.   PERTINENT HISTORY: history of non-small cell lung cancer with most recent CT 10/05/2022 demonstrating diffuse anasarca, as well as bilateral pleural  effusions, hx of radiation to chest,  hx of breast biopsy (benign), dementia and Alzheimer's   PAIN:  Are you having pain? No  PRECAUTIONS: Other:  chart states pt has Alzheimer's/dementia - pt unable to verify diagnosis in chart, no one else present with pt to assist  RED FLAGS: Unable to ask as pt has memory issues and no one present at appointment  WEIGHT BEARING RESTRICTIONS: No  FALLS:  Has patient fallen in last 6 months? No  LIVING ENVIRONMENT: Lives with: lives with their daughter Lives in: House/apartment Has following equipment at home: Dan Humphreys - 4 wheeled pt reports she does not use it  OCCUPATION: retired  LEISURE: pt reports she tries to walk  HAND DOMINANCE: right   PRIOR LEVEL OF FUNCTION: Independent does have memory issues  PATIENT GOALS: pt reports she does not know   OBJECTIVE: Note: Objective measures were completed at Evaluation unless otherwise noted.  COGNITION: Overall cognitive status: Within functional limits for tasks assessed   PALPATION: Fibrosis present in inferior L breast  OBSERVATIONS / OTHER ASSESSMENTS: L significantly larger than R breast, approximately double the size  POSTURE: forward head, rounded shoulders  UPPER EXTREMITY AROM/PROM: WFL  CERVICAL AROM: All within normal limits:    Percent limited  Flexion WFL  Extension 75% limited  Right lateral flexion WFL  Left lateral flexion 25% limited  Right rotation WFL  Left rotation WFL    LYMPHEDEMA ASSESSMENTS:   LANDMARK RIGHT  eval  At axilla  28.5  15 cm proximal to olecranon process 25.6  10 cm proximal to olecranon process 25.5  Olecranon process 22.5  15 cm proximal to ulnar styloid process 22.1  10 cm proximal to ulnar styloid process 18.5  Just proximal to ulnar styloid process 14.5  Across hand at thumb web space 18.8  At base of 2nd digit 6  (Blank rows = not tested)  LANDMARK LEFT  eval  At axilla  28.5  15 cm proximal to olecranon process 26.5   10 cm proximal to olecranon process 25.9  Olecranon process 24  15 cm proximal to ulnar styloid process 21.5  10 cm proximal to ulnar styloid process 18.1  Just proximal to ulnar styloid process 14.5  Across hand at thumb web space 18.2  At base of 2nd digit 5.6  (Blank rows = not tested)  LLIS:  Flowsheet Row Outpatient Rehab from 03/20/2023 in [facility]  Lymphedema Life Impact Scale Total Score 14.71 %      14.71  TODAY'S TREATMENT:                                                                                                                                          DATE:  04/02/23: Self Care Began session with educating daughters and pt in basics of anatomy of lymphatic system and principles of MLD before beginning MLD. Also further discussed compression bra. Showed compression bras to pt and daughters and explained how this will also help decrease lymphedema. Daughter (who works at [employer]) had to  call multiple times to get someone on the phone at [facility] and finally got a Dec appt. Advised her of [facility] in [place] if that wasn't too far for them to drive to see if they could get her in sooner. Also discussed and showed swell spots and how pt can benefit from this as well due to amount of fibrosis present in most of breast.  Manual Therapy MLD to Lt breast as follows: Short neck, 5 diaphragmatic breaths (took some time to instruct pt in belly breaths, not chest breathing), Lt inguinal nodes and Lt axillo-inguinal anastomosis, Rt axillary nodes, Rt anterior intact thorax, anterior inter-axillary anastomosis, then focused on superior and medial breast redirecting towards anterior anastomosis, then into Rt S/L for focus to lateral and inferior breast and lateral trunk where larger area of visible edema present so focused here redirecting towards lateral anastomosis, then finished retracing all steps to anastomoses and began instructing daughter  while performing in addition to instruction offered at beginning of session.   03/20/23: None today as pt was unaccompanied and has dementia, will begin at next session when her daughter can be here    PATIENT EDUCATION:  Education details: lymphedema and how it is managed with manual lymphatic drainage and compression bra and swell spot or foam; began instruction of self MLD Person educated: Patient, daughter Education method: Explanation and Handouts, demonstration and verbal and tactile cues Education comprehension: verbalized understanding, will benefit from further review  HOME EXERCISE PROGRAM: Obtain compression bra  ASSESSMENT:  CLINICAL IMPRESSION: Pt returns for her first MLD session to begin treatment of her Lt breast lymphedema. Her daughters were present so included them in all of education during session today as pt lives with one of her daughters and she will be helping her mom with this. Also spent some time showing them compression bra and explaining how this will help decrease her lymphedema along with MLD for long term management. Also discussed compression pumps and advised them that we can look into this but not sure if insurance will cover this without a breast cancer diagnosis. After session pt reported that her breast felt much softer and less heavy.    OBJECTIVE IMPAIRMENTS: decreased knowledge of condition, decreased knowledge of use of DME, increased edema, and postural dysfunction.   ACTIVITY LIMITATIONS:  none  PARTICIPATION LIMITATIONS:  none  PERSONAL FACTORS: Time since onset of injury/illness/exacerbation and 1 comorbidity: dementia  are also affecting patient's functional outcome.   REHAB POTENTIAL: Good  CLINICAL DECISION MAKING: Stable/uncomplicated  EVALUATION COMPLEXITY: Low  GOALS: Goals reviewed with patient? Yes  SHORT TERM GOALS=LONG TERM GOALS Target date: 04/17/23  Pt will obtain a compression bra for long term management of L breast  lymphedema.  Baseline: Goal status: INITIAL  2.  Pt's daughters will be independent in breast MLD for long term management of lymphedema.  Baseline:  Goal status: INITIAL  3.  Pt will demonstrate a 50% improvement in fibrosis in L breast to decrease risk of infection.  Baseline:  Goal status: INITIAL  PLAN:  PT FREQUENCY: 2x/week  PT DURATION: 4 weeks  PLANNED INTERVENTIONS: 97140- Manual therapy, [id number]- Orthotic Fit/training, Patient/Family education, Therapeutic exercises, Therapeutic activity, and Self Care  PLAN FOR NEXT SESSION: Cont MLD to L breast and instruct daughter, did she get a compression bra?  Hermenia Bers, PTA 04/02/2023, 3:16 PM  Self manual lymph drainage: Perform this sequence once a day.  Only give enough pressure to your skin to make the skin  move.  Diaphragmatic - Supine   Inhale through nose making navel move out toward hands. Exhale through puckered lips, hands follow navel in. Repeat _5__ times. Rest _10__ seconds between repeats.   Copyright  VHI. All rights reserved.  Hug yourself.  Do circles at your neck just above your collarbones.  Repeat this 10 times.  Axilla - One at a Time   Using full weight of flat hand and fingers at center of uninvolved armpit, make _10__ in-place circles.   Copyright  VHI. All rights reserved.  LEG: Inguinal Nodes Stimulation   With small finger side of hand against hip crease on involved side, gently perform circles at the crease. Repeat __10_ times.   Copyright  VHI. All rights reserved.  Axilla to Inguinal Nodes - Sweep   On involved side, pump _4__ times from armpit along side of trunk to hip crease.  Now gently stretch skin from the involved side to the uninvolved side across the chest at the shoulder line.  Repeat that 4 times.  Draw an imaginary diagonal line from upper outer breast through the nipple area toward lower inner breast.  Direct fluid upward and inward from this line  toward the pathway across your upper chest .  Do this in three rows to treat all of the upper inner breast tissue, and do each row 3-4x.      Direct fluid to treat all of lower outer breast tissue downward and outward toward      pathway that is aimed at the left groin.  Finish by doing the pathways as described above going from your involved armpit to the same side groin and going across your upper chest from the involved shoulder to the uninvolved shoulder.  Repeat the steps above where you do circles in your left groin and right armpit. Copyright  VHI. All rights reserved.    Cancer Rehab [phone]

## 2023-04-03 ENCOUNTER — Ambulatory Visit: Payer: Medicare HMO | Admitting: *Deleted

## 2023-04-03 DIAGNOSIS — J4550 Severe persistent asthma, uncomplicated: Secondary | ICD-10-CM | POA: Diagnosis not present

## 2023-04-04 ENCOUNTER — Ambulatory Visit: Payer: Medicare HMO

## 2023-04-04 ENCOUNTER — Ambulatory Visit: Payer: Medicare HMO | Admitting: Neurology

## 2023-04-04 DIAGNOSIS — I890 Lymphedema, not elsewhere classified: Secondary | ICD-10-CM | POA: Diagnosis not present

## 2023-04-04 DIAGNOSIS — L599 Disorder of the skin and subcutaneous tissue related to radiation, unspecified: Secondary | ICD-10-CM | POA: Diagnosis not present

## 2023-04-04 NOTE — Therapy (Signed)
OUTPATIENT PHYSICAL THERAPY  UPPER EXTREMITY ONCOLOGY TREATMENT  Patient Name: Angelica [name] MRN: [id number] DOB:07/27/1940, 82 y.o., female Today's Date: 04/04/2023  END OF SESSION:  PT End of Session - 04/04/23 0908     Visit Number 3    Number of Visits 9    Date for PT Re-Evaluation 04/17/23    PT Start Time 0905    PT Stop Time 1003    PT Time Calculation (min) 58 min    Activity Tolerance Patient tolerated treatment well    Behavior During Therapy [facility] for tasks assessed/performed             Past Medical History:  Diagnosis Date   Arthritis    Asthma    Bowel obstruction (HCC)    Cancer (HCC)    Environmental allergies    GERD (gastroesophageal reflux disease)    Glaucoma 05/09/2017   H. pylori infection    History of radiation therapy 07/05/20-08/05/20   Left lung IMRT Dr. Roselind Messier    HLD (hyperlipidemia) 01/16/2019   Hypertension    Iron deficiency anemia    Osteopenia 05/26/2017   Thyroid disease    Urticaria 07/25/2018   Past Surgical History:  Procedure Laterality Date   ABDOMINAL HYSTERECTOMY     BOWEL RESECTION N/A 05/23/2020   Procedure: SMALL BOWEL REPAIR;  Surgeon: Sheliah Hatch De Blanch, MD;  Location: [facility] OR;  Service: General;  Laterality: N/A;   BREAST BIOPSY     BRONCHIAL BIOPSY  06/10/2020   Procedure: BRONCHIAL BIOPSIES;  Surgeon: [name], DO;  Location: [facility] ENDOS[facility];  Service: Pulmonary;;   BRONCHIAL BRUSHINGS  06/10/2020   Procedure: BRONCHIAL BRUSHINGS;  Surgeon: [name], DO;  Location: [facility];  Service: Pulmonary;;   BRONCHIAL NEEDLE ASPIRATION BIOPSY  06/10/2020   Procedure: BRONCHIAL NEEDLE ASPIRATION BIOPSIES;  Surgeon: [name], DO;  Location: [facility];  Service: Pulmonary;;   BRONCHIAL WASHINGS  06/10/2020   Procedure: BRONCHIAL WASHINGS;  Surgeon: [name], DO;  Location: [facility];  Service: Pulmonary;;   COLON SURGERY     DIAGNOSTIC LARYNGOSCOPY N/A 05/23/2020   Procedure: ATTEMPTED  DIAGNOSTIC LAPAROSCOPY WITH ADHESIONS;  Surgeon: Sheliah Hatch, De Blanch, MD;  Location: [facility] OR;  Service: General;  Laterality: N/A;   IR IMAGING GUIDED PORT INSERTION  07/15/2020   IR REMOVAL TUN ACCESS W/ PORT W/O FL MOD SED  01/16/2023   KNEE SURGERY     LAPAROTOMY N/A 04/18/2015   Procedure: Exploratory laparotomy with lysis of adhesions, possible bowel resection;  Surgeon: Axel Filler, MD;  Location: [facility] OR;  Service: General;  Laterality: N/A;   LAPAROTOMY N/A 05/23/2020   Procedure: EXPLORATORY LAPAROTOMY;  Surgeon: Rodman Pickle, MD;  Location: [facility] OR;  Service: General;  Laterality: N/A;   LYSIS OF ADHESION N/A 05/23/2020   Procedure: LYSIS OF ADHESION;  Surgeon: Sheliah Hatch De Blanch, MD;  Location: [facility] OR;  Service: General;  Laterality: N/A;   NASAL SINUS SURGERY     THORACENTESIS Left 12/03/2022   Procedure: THORACENTESIS;  Surgeon: [name], DO;  Location: [facility];  Service: Pulmonary;  Laterality: Left;   THYROID SURGERY     VIDEO BRONCHOSCOPY WITH ENDOBRONCHIAL NAVIGATION Bilateral 06/10/2020   Procedure: VIDEO BRONCHOSCOPY WITH ENDOBRONCHIAL NAVIGATION;  Surgeon: [name], DO;  Location: [facility];  Service: Pulmonary;  Laterality: Bilateral;   VIDEO BRONCHOSCOPY WITH ENDOBRONCHIAL ULTRASOUND  06/10/2020   Procedure: VIDEO BRONCHOSCOPY WITH ENDOBRONCHIAL ULTRASOUND;  Surgeon: [name], DO;  Location: [facility]  ENDOSCOPY;  Service: Pulmonary;;   Patient Active Problem List   Diagnosis Date Noted   Mastodynia of left breast 02/13/2023   Pleural effusion 11/21/2022   Swelling of breast 11/17/2022   Dyspnea 11/14/2022   Cellulitis of left breast 10/17/2022   Iron deficiency anemia 10/11/2022   Acute respiratory failure with hypoxia (HCC) 10/05/2022   Bilateral pleural effusion 10/05/2022   History of stroke 10/05/2022   Weakness 10/05/2022   Edema 10/05/2022   Alzheimer's disease (HCC) 09/06/2022   Ex-smoker 09/06/2022   Hypothyroidism 09/06/2022    Right leg pain 07/13/2021   Dementia (HCC) 07/13/2021   RLQ abdominal pain 07/13/2021   Encounter for well adult exam with abnormal findings 06/10/2021   Anosmia 05/01/2021   Chronic ethmoidal sinusitis 02/14/2021   Debility 02/01/2021   Gait disorder 02/01/2021   Malignant neoplasm of bronchus and lung (HCC)    Port-A-Cath in place 11/02/2020   Adenocarcinoma of left lung, stage 3 (HCC) 06/22/2020   Lung mass    Pulmonary nodule 05/17/2020   Iron deficiency 05/03/2020   Swelling of lower extremity 07/30/2019   Ganglion cyst of right foot 01/16/2019   HLD (hyperlipidemia) 01/16/2019   LPRD (laryngopharyngeal reflux disease) 07/25/2018   Constipation 08/19/2017   Asthma, severe persistent, well-controlled 06/04/2017   Chronic pansinusitis 06/04/2017   Nasal polyposis 06/04/2017   Osteopenia 05/26/2017   GERD (gastroesophageal reflux disease)    H. pylori infection    Other allergic rhinitis 03/20/2016   Malnutrition of moderate degree 04/11/2015   HTN (hypertension) 04/10/2015    PCP: Oliver Barre, MD  REFERRING PROVIDER: Harriette Bouillon, MD  REFERRING DIAG: I89.0 (ICD-10-CM) - Lymphedema, not elsewhere classified  THERAPY DIAG:  Lymphedema, not elsewhere classified  Disorder of the skin and subcutaneous tissue related to radiation, unspecified  ONSET DATE: pt unable to give a date - "it has been a while"  Rationale for Evaluation and Treatment: Rehabilitation  SUBJECTIVE:                                                                                                                                                                                           SUBJECTIVE STATEMENT: Pts daughter got an appt at [facility] for a compression bra today at 330, will also ask about a swell spot. "My breast felt better after last session. It doesn't hurt."  PERTINENT HISTORY: history of non-small cell lung cancer with most recent CT 10/05/2022 demonstrating diffuse anasarca, as well  as bilateral pleural effusions, hx of radiation to chest,  hx of breast biopsy (benign), dementia and Alzheimer's   PAIN:  Are you having pain? No  PRECAUTIONS:  Other: chart states pt has Alzheimer's/dementia - pt unable to verify diagnosis in chart, no one else present with pt to assist  RED FLAGS: Unable to ask as pt has memory issues and no one present at appointment  WEIGHT BEARING RESTRICTIONS: No  FALLS:  Has patient fallen in last 6 months? No  LIVING ENVIRONMENT: Lives with: lives with their daughter Lives in: House/apartment Has following equipment at home: Dan Humphreys - 4 wheeled pt reports she does not use it  OCCUPATION: retired  LEISURE: pt reports she tries to walk  HAND DOMINANCE: right   PRIOR LEVEL OF FUNCTION: Independent does have memory issues  PATIENT GOALS: pt reports she does not know   OBJECTIVE: Note: Objective measures were completed at Evaluation unless otherwise noted.  COGNITION: Overall cognitive status: Within functional limits for tasks assessed   PALPATION: Fibrosis present in inferior L breast  OBSERVATIONS / OTHER ASSESSMENTS: L significantly larger than R breast, approximately double the size  POSTURE: forward head, rounded shoulders  UPPER EXTREMITY AROM/PROM: WFL  CERVICAL AROM: All within normal limits:    Percent limited  Flexion WFL  Extension 75% limited  Right lateral flexion WFL  Left lateral flexion 25% limited  Right rotation WFL  Left rotation WFL    LYMPHEDEMA ASSESSMENTS:   LANDMARK RIGHT  eval  At axilla  28.5  15 cm proximal to olecranon process 25.6  10 cm proximal to olecranon process 25.5  Olecranon process 22.5  15 cm proximal to ulnar styloid process 22.1  10 cm proximal to ulnar styloid process 18.5  Just proximal to ulnar styloid process 14.5  Across hand at thumb web space 18.8  At base of 2nd digit 6  (Blank rows = not tested)  LANDMARK LEFT  eval  At axilla  28.5  15 cm proximal to  olecranon process 26.5  10 cm proximal to olecranon process 25.9  Olecranon process 24  15 cm proximal to ulnar styloid process 21.5  10 cm proximal to ulnar styloid process 18.1  Just proximal to ulnar styloid process 14.5  Across hand at thumb web space 18.2  At base of 2nd digit 5.6  (Blank rows = not tested)  LLIS:  Flowsheet Row Outpatient Rehab from 03/20/2023 in [facility]  Lymphedema Life Impact Scale Total Score 14.71 %      14.71  TODAY'S TREATMENT:                                                                                                                                          DATE:  04/04/23: Manual Therapy MLD to Lt breast as follows: Short neck, superficial and deep abdominals (continued with instruction of belly breaths, not chest breathing), Lt inguinal nodes and Lt axillo-inguinal anastomosis, Rt axillary nodes, Rt anterior intact thorax, anterior inter-axillary anastomosis, then focused on superior and medial breast redirecting towards anterior anastomosis,  then into Rt S/L for focus to lateral and inferior breast and lateral trunk where larger area of visible edema present but already some reduced from last session so focused here redirecting towards lateral anastomosis, then finished retracing all steps to anastomoses in supine  04/02/23: Self Care Began session with educating daughters and pt in basics of anatomy of lymphatic system and principles of MLD before beginning MLD. Also further discussed compression bra. Showed compression bras to pt and daughters and explained how this will also help decrease lymphedema. Daughter (who works at [employer]) had to call multiple times to get someone on the phone at [facility] [place] and finally got a Dec appt. Advised her of [facility] in [place] if that wasn't too far for them to drive to see if they could get her in sooner. Also discussed and showed swell spots and how pt can benefit from  this as well due to amount of fibrosis present in most of breast.  Manual Therapy MLD to Lt breast as follows: Short neck, 5 diaphragmatic breaths (took some time to instruct pt in belly breaths, not chest breathing), Lt inguinal nodes and Lt axillo-inguinal anastomosis, Rt axillary nodes, Rt anterior intact thorax, anterior inter-axillary anastomosis, then focused on superior and medial breast redirecting towards anterior anastomosis, then into Rt S/L for focus to lateral and inferior breast and lateral trunk where larger area of visible edema present so focused here redirecting towards lateral anastomosis, then finished retracing all steps in supine to anastomoses and began instructing daughter while performing in addition to instruction offered at beginning of session.   03/20/23: None today as pt was unaccompanied and has dementia, will begin at next session when her daughter can be here    PATIENT EDUCATION:  Education details: lymphedema and how it is managed with manual lymphatic drainage and compression bra and swell spot or foam; began instruction of self MLD Person educated: Patient, daughter Education method: Explanation and Handouts, demonstration and verbal and tactile cues Education comprehension: verbalized understanding, will benefit from further review  HOME EXERCISE PROGRAM: Obtain compression bra  ASSESSMENT:  CLINICAL IMPRESSION: Pt reports her breast feeling softer and better since first session. Daughter was able to get an appt at [facility] for today so they will go there instead of [facility] [place]. Issued script from Dr. Luisa Hart for a compression bra and reminded them about a swell spot as well. Continued with MLD to Lt breast which did have some good improvement noted today. Fibrosis was smaller at medial breast and larger area of lymphedema at lateral breast was much improved today visibly and palpably. Daughter wanted to watch for review today. Will have her return some  demo at next session.    OBJECTIVE IMPAIRMENTS: decreased knowledge of condition, decreased knowledge of use of DME, increased edema, and postural dysfunction.   ACTIVITY LIMITATIONS:  none  PARTICIPATION LIMITATIONS:  none  PERSONAL FACTORS: Time since onset of injury/illness/exacerbation and 1 comorbidity: dementia  are also affecting patient's functional outcome.   REHAB POTENTIAL: Good  CLINICAL DECISION MAKING: Stable/uncomplicated  EVALUATION COMPLEXITY: Low  GOALS: Goals reviewed with patient? Yes  SHORT TERM GOALS=LONG TERM GOALS Target date: 04/17/23  Pt will obtain a compression bra for long term management of L breast lymphedema.  Baseline: Goal status: INITIAL  2.  Pt's daughters will be independent in breast MLD for long term management of lymphedema.  Baseline:  Goal status: INITIAL  3.  Pt will demonstrate a 50% improvement in fibrosis in  L breast to decrease risk of infection.  Baseline:  Goal status: INITIAL  PLAN:  PT FREQUENCY: 2x/week  PT DURATION: 4 weeks  PLANNED INTERVENTIONS: 97140- Manual therapy, [id number]- Orthotic Fit/training, Patient/Family education, Therapeutic exercises, Therapeutic activity, and Self Care  PLAN FOR NEXT SESSION: Cont MLD to L breast and instruct daughter having her return some demo, did she get a compression bra?, assess fit when she does  Hermenia Bers, PTA 04/04/2023, 11:10 AM  Self manual lymph drainage: Perform this sequence once a day.  Only give enough pressure to your skin to make the skin move.  Diaphragmatic - Supine   Inhale through nose making navel move out toward hands. Exhale through puckered lips, hands follow navel in. Repeat _5__ times. Rest _10__ seconds between repeats.   Copyright  VHI. All rights reserved.  Hug yourself.  Do circles at your neck just above your collarbones.  Repeat this 10 times.  Axilla - One at a Time   Using full weight of flat hand and fingers at center of  uninvolved armpit, make _10__ in-place circles.   Copyright  VHI. All rights reserved.  LEG: Inguinal Nodes Stimulation   With small finger side of hand against hip crease on involved side, gently perform circles at the crease. Repeat __10_ times.   Copyright  VHI. All rights reserved.  Axilla to Inguinal Nodes - Sweep   On involved side, pump _4__ times from armpit along side of trunk to hip crease.  Now gently stretch skin from the involved side to the uninvolved side across the chest at the shoulder line.  Repeat that 4 times.  Draw an imaginary diagonal line from upper outer breast through the nipple area toward lower inner breast.  Direct fluid upward and inward from this line toward the pathway across your upper chest .  Do this in three rows to treat all of the upper inner breast tissue, and do each row 3-4x.      Direct fluid to treat all of lower outer breast tissue downward and outward toward      pathway that is aimed at the left groin.  Finish by doing the pathways as described above going from your involved armpit to the same side groin and going across your upper chest from the involved shoulder to the uninvolved shoulder.  Repeat the steps above where you do circles in your left groin and right armpit. Copyright  VHI. All rights reserved.    Cancer Rehab [phone]

## 2023-04-05 ENCOUNTER — Ambulatory Visit: Payer: Medicare HMO | Admitting: Pulmonary Disease

## 2023-04-09 ENCOUNTER — Ambulatory Visit: Payer: Medicare HMO | Admitting: Physical Therapy

## 2023-04-09 ENCOUNTER — Encounter: Payer: Self-pay | Admitting: Physical Therapy

## 2023-04-09 DIAGNOSIS — L599 Disorder of the skin and subcutaneous tissue related to radiation, unspecified: Secondary | ICD-10-CM

## 2023-04-09 DIAGNOSIS — I890 Lymphedema, not elsewhere classified: Secondary | ICD-10-CM | POA: Diagnosis not present

## 2023-04-09 NOTE — Therapy (Signed)
OUTPATIENT PHYSICAL THERAPY  UPPER EXTREMITY ONCOLOGY TREATMENT  Patient Name: Angelica [name] MRN: [id number] DOB:[birth year], 82 y.o., female Today's Date: 04/09/2023  END OF SESSION:  PT End of Session - 04/09/23 1208     Visit Number 4    Number of Visits 9    Date for PT Re-Evaluation 04/17/23    PT Start Time 1207    PT Stop Time 1257    PT Time Calculation (min) 50 min    Activity Tolerance Patient tolerated treatment well    Behavior During Therapy [facility] for tasks assessed/performed             Past Medical History:  Diagnosis Date   Arthritis    Asthma    Bowel obstruction (HCC)    Cancer (HCC)    Environmental allergies    GERD (gastroesophageal reflux disease)    Glaucoma 05/09/2017   H. pylori infection    History of radiation therapy 07/05/20-08/05/20   Left lung IMRT Dr. Roselind Messier    HLD (hyperlipidemia) 01/16/2019   Hypertension    Iron deficiency anemia    Osteopenia 05/26/2017   Thyroid disease    Urticaria 07/25/2018   Past Surgical History:  Procedure Laterality Date   ABDOMINAL HYSTERECTOMY     BOWEL RESECTION N/A 05/23/2020   Procedure: SMALL BOWEL REPAIR;  Surgeon: Sheliah Hatch De Blanch, MD;  Location: [facility] OR;  Service: General;  Laterality: N/A;   BREAST BIOPSY     BRONCHIAL BIOPSY  06/10/2020   Procedure: BRONCHIAL BIOPSIES;  Surgeon: [name], DO;  Location: [facility] ENDOSCOPY;  Service: Pulmonary;;   BRONCHIAL BRUSHINGS  06/10/2020   Procedure: BRONCHIAL BRUSHINGS;  Surgeon: [name], DO;  Location: [facility];  Service: Pulmonary;;   BRONCHIAL NEEDLE ASPIRATION BIOPSY  06/10/2020   Procedure: BRONCHIAL NEEDLE ASPIRATION BIOPSIES;  Surgeon: [name], DO;  Location: [facility];  Service: Pulmonary;;   BRONCHIAL WASHINGS  06/10/2020   Procedure: BRONCHIAL WASHINGS;  Surgeon: [name], DO;  Location: [facility];  Service: Pulmonary;;   COLON SURGERY     DIAGNOSTIC LARYNGOSCOPY N/A 05/23/2020   Procedure: ATTEMPTED  DIAGNOSTIC LAPAROSCOPY WITH ADHESIONS;  Surgeon: Sheliah Hatch, De Blanch, MD;  Location: [facility] OR;  Service: General;  Laterality: N/A;   IR IMAGING GUIDED PORT INSERTION  07/15/2020   IR REMOVAL TUN ACCESS W/ PORT W/O FL MOD SED  01/16/2023   KNEE SURGERY     LAPAROTOMY N/A 04/18/2015   Procedure: Exploratory laparotomy with lysis of adhesions, possible bowel resection;  Surgeon: Axel Filler, MD;  Location: [facility] OR;  Service: General;  Laterality: N/A;   LAPAROTOMY N/A 05/23/2020   Procedure: EXPLORATORY LAPAROTOMY;  Surgeon: Rodman Pickle, MD;  Location: [facility] OR;  Service: General;  Laterality: N/A;   LYSIS OF ADHESION N/A 05/23/2020   Procedure: LYSIS OF ADHESION;  Surgeon: Sheliah Hatch De Blanch, MD;  Location: [facility] OR;  Service: General;  Laterality: N/A;   NASAL SINUS SURGERY     THORACENTESIS Left 12/03/2022   Procedure: THORACENTESIS;  Surgeon: [name], DO;  Location: [facility];  Service: Pulmonary;  Laterality: Left;   THYROID SURGERY     VIDEO BRONCHOSCOPY WITH ENDOBRONCHIAL NAVIGATION Bilateral 06/10/2020   Procedure: VIDEO BRONCHOSCOPY WITH ENDOBRONCHIAL NAVIGATION;  Surgeon: [name], DO;  Location: [facility];  Service: Pulmonary;  Laterality: Bilateral;   VIDEO BRONCHOSCOPY WITH ENDOBRONCHIAL ULTRASOUND  06/10/2020   Procedure: VIDEO BRONCHOSCOPY WITH ENDOBRONCHIAL ULTRASOUND;  Surgeon: [name], DO;  Location: [facility]  ENDOSCOPY;  Service: Pulmonary;;   Patient Active Problem List   Diagnosis Date Noted   Mastodynia of left breast 02/13/2023   Pleural effusion 11/21/2022   Swelling of breast 11/17/2022   Dyspnea 11/14/2022   Cellulitis of left breast 10/17/2022   Iron deficiency anemia 10/11/2022   Acute respiratory failure with hypoxia (HCC) 10/05/2022   Bilateral pleural effusion 10/05/2022   History of stroke 10/05/2022   Weakness 10/05/2022   Edema 10/05/2022   Alzheimer's disease (HCC) 09/06/2022   Ex-smoker 09/06/2022   Hypothyroidism 09/06/2022    Right leg pain 07/13/2021   Dementia (HCC) 07/13/2021   RLQ abdominal pain 07/13/2021   Encounter for well adult exam with abnormal findings 06/10/2021   Anosmia 05/01/2021   Chronic ethmoidal sinusitis 02/14/2021   Debility 02/01/2021   Gait disorder 02/01/2021   Malignant neoplasm of bronchus and lung (HCC)    Port-A-Cath in place 11/02/2020   Adenocarcinoma of left lung, stage 3 (HCC) 06/22/2020   Lung mass    Pulmonary nodule 05/17/2020   Iron deficiency 05/03/2020   Swelling of lower extremity 07/30/2019   Ganglion cyst of right foot 01/16/2019   HLD (hyperlipidemia) 01/16/2019   LPRD (laryngopharyngeal reflux disease) 07/25/2018   Constipation 08/19/2017   Asthma, severe persistent, well-controlled 06/04/2017   Chronic pansinusitis 06/04/2017   Nasal polyposis 06/04/2017   Osteopenia 05/26/2017   GERD (gastroesophageal reflux disease)    H. pylori infection    Other allergic rhinitis 03/20/2016   Malnutrition of moderate degree 04/11/2015   HTN (hypertension) 04/10/2015    PCP: Oliver Barre, MD  REFERRING PROVIDER: Harriette Bouillon, MD  REFERRING DIAG: I89.0 (ICD-10-CM) - Lymphedema, not elsewhere classified  THERAPY DIAG:  Lymphedema, not elsewhere classified  Disorder of the skin and subcutaneous tissue related to radiation, unspecified  ONSET DATE: pt unable to give a date - "it has been a while"  Rationale for Evaluation and Treatment: Rehabilitation  SUBJECTIVE:                                                                                                                                                                                           SUBJECTIVE STATEMENT: I got the bra. It feels ok.   PERTINENT HISTORY: history of non-small cell lung cancer with most recent CT 10/05/2022 demonstrating diffuse anasarca, as well as bilateral pleural effusions, hx of radiation to chest,  hx of breast biopsy (benign), dementia and Alzheimer's   PAIN:  Are you  having pain? No  PRECAUTIONS: Other: chart states pt has Alzheimer's/dementia - pt unable to verify diagnosis in chart, no one else present with pt to assist  RED FLAGS: Unable to ask as pt has memory issues and no one present at appointment  WEIGHT BEARING RESTRICTIONS: No  FALLS:  Has patient fallen in last 6 months? No  LIVING ENVIRONMENT: Lives with: lives with their daughter Lives in: House/apartment Has following equipment at home: Dan Humphreys - 4 wheeled pt reports she does not use it  OCCUPATION: retired  LEISURE: pt reports she tries to walk  HAND DOMINANCE: right   PRIOR LEVEL OF FUNCTION: Independent does have memory issues  PATIENT GOALS: pt reports she does not know   OBJECTIVE: Note: Objective measures were completed at Evaluation unless otherwise noted.  COGNITION: Overall cognitive status: Within functional limits for tasks assessed   PALPATION: Fibrosis present in inferior L breast  OBSERVATIONS / OTHER ASSESSMENTS: L significantly larger than R breast, approximately double the size  POSTURE: forward head, rounded shoulders  UPPER EXTREMITY AROM/PROM: WFL  CERVICAL AROM: All within normal limits:    Percent limited  Flexion WFL  Extension 75% limited  Right lateral flexion WFL  Left lateral flexion 25% limited  Right rotation WFL  Left rotation WFL    LYMPHEDEMA ASSESSMENTS:   LANDMARK RIGHT  eval  At axilla  28.5  15 cm proximal to olecranon process 25.6  10 cm proximal to olecranon process 25.5  Olecranon process 22.5  15 cm proximal to ulnar styloid process 22.1  10 cm proximal to ulnar styloid process 18.5  Just proximal to ulnar styloid process 14.5  Across hand at thumb web space 18.8  At base of 2nd digit 6  (Blank rows = not tested)  LANDMARK LEFT  eval  At axilla  28.5  15 cm proximal to olecranon process 26.5  10 cm proximal to olecranon process 25.9  Olecranon process 24  15 cm proximal to ulnar styloid process 21.5   10 cm proximal to ulnar styloid process 18.1  Just proximal to ulnar styloid process 14.5  Across hand at thumb web space 18.2  At base of 2nd digit 5.6  (Blank rows = not tested)  LLIS:  Flowsheet Row Outpatient Rehab from [facility]/20/2023 in [facility]  Lymphedema Life Impact Scale Total Score 14.71 %      14.71  TODAY'S TREATMENT:                                                                                                                                          DATE:  04/09/23: In supine: Short neck, 5 diaphragmatic breaths, R axillary nodes and establishment of interaxillary pathway, L inguinal nodes and establishment of axilloinguinal pathway, then L breast moving fluid towards pathways spending extra time in any areas of fibrosis then retracing all steps. Instructed pt and her daughter throughout and had pt's daughter return demonstrate each step while providing v/c and t/c for correct skin stretch technique and sequence. Issued handout. Educated pt and daughter  that they can just use the interaxillary pathway if it is easier at home.   04/04/23: Manual Therapy MLD to Lt breast as follows: Short neck, superficial and deep abdominals (continued with instruction of belly breaths, not chest breathing), Lt inguinal nodes and Lt axillo-inguinal anastomosis, Rt axillary nodes, Rt anterior intact thorax, anterior inter-axillary anastomosis, then focused on superior and medial breast redirecting towards anterior anastomosis, then into Rt S/L for focus to lateral and inferior breast and lateral trunk where larger area of visible edema present but already some reduced from last session so focused here redirecting towards lateral anastomosis, then finished retracing all steps to anastomoses in supine  04/02/23: Self Care Began session with educating daughters and pt in basics of anatomy of lymphatic system and principles of MLD before beginning MLD. Also further  discussed compression bra. Showed compression bras to pt and daughters and explained how this will also help decrease lymphedema. Daughter (who works at [employer]) had to call multiple times to get someone on the phone at [facility] and finally got a Dec appt. Advised her of [facility] in [place] if that wasn't too far for them to drive to see if they could get her in sooner. Also discussed and showed swell spots and how pt can benefit from this as well due to amount of fibrosis present in most of breast.  Manual Therapy MLD to Lt breast as follows: Short neck, 5 diaphragmatic breaths (took some time to instruct pt in belly breaths, not chest breathing), Lt inguinal nodes and Lt axillo-inguinal anastomosis, Rt axillary nodes, Rt anterior intact thorax, anterior inter-axillary anastomosis, then focused on superior and medial breast redirecting towards anterior anastomosis, then into Rt S/L for focus to lateral and inferior breast and lateral trunk where larger area of visible edema present so focused here redirecting towards lateral anastomosis, then finished retracing all steps in supine to anastomoses and began instructing daughter while performing in addition to instruction offered at beginning of session.   03/20/23: None today as pt was unaccompanied and has dementia, will begin at next session when her daughter can be here    PATIENT EDUCATION:  Education details: lymphedema and how it is managed with manual lymphatic drainage and compression bra and swell spot or foam; began instruction of self MLD Person educated: Patient, daughter Education method: Explanation and Handouts, demonstration and verbal and tactile cues Education comprehension: verbalized understanding, will benefit from further review  HOME EXERCISE PROGRAM: Obtain compression bra  ASSESSMENT:  CLINICAL IMPRESSION: Pt received her compression bra from [facility]. Assessed bra for proper fit. Pt reports she feels much  better and more supported with new compression bra. Began having pt's daughter return demonstrate correct skin stretch technique and sequence today and issued handout for her to begin to practice at home.   OBJECTIVE IMPAIRMENTS: decreased knowledge of condition, decreased knowledge of use of DME, increased edema, and postural dysfunction.   ACTIVITY LIMITATIONS:  none  PARTICIPATION LIMITATIONS:  none  PERSONAL FACTORS: Time since onset of injury/illness/exacerbation and 1 comorbidity: dementia  are also affecting patient's functional outcome.   REHAB POTENTIAL: Good  CLINICAL DECISION MAKING: Stable/uncomplicated  EVALUATION COMPLEXITY: Low  GOALS: Goals reviewed with patient? Yes  SHORT TERM GOALS=LONG TERM GOALS Target date: 04/17/23  Pt will obtain a compression bra for long term management of L breast lymphedema.  Baseline: Goal status: INITIAL  2.  Pt's daughters will be independent in breast MLD for long term management of lymphedema.  Baseline:  Goal status: INITIAL  3.  Pt will demonstrate a 50% improvement in fibrosis in L breast to decrease risk of infection.  Baseline:  Goal status: INITIAL  PLAN:  PT FREQUENCY: 2x/week  PT DURATION: 4 weeks  PLANNED INTERVENTIONS: 97140- Manual therapy, [id number]- Orthotic Fit/training, Patient/Family education, Therapeutic exercises, Therapeutic activity, and Self Care  PLAN FOR NEXT SESSION: Cont MLD to L breast and instruct daughter having her return demo,   [employer], PT 04/09/2023, 1:02 PM  Self manual lymph drainage: Perform this sequence once a day.  Only give enough pressure to your skin to make the skin move.  Diaphragmatic - Supine   Inhale through nose making navel move out toward hands. Exhale through puckered lips, hands follow navel in. Repeat _5__ times. Rest _10__ seconds between repeats.   Copyright  VHI. All rights reserved.  Hug yourself.  Do circles at your neck just above your  collarbones.  Repeat this 10 times.  Axilla - One at a Time   Using full weight of flat hand and fingers at center of uninvolved armpit, make _10__ in-place circles.   Copyright  VHI. All rights reserved.  LEG: Inguinal Nodes Stimulation   With small finger side of hand against hip crease on involved side, gently perform circles at the crease. Repeat __10_ times.   Copyright  VHI. All rights reserved.  Axilla to Inguinal Nodes - Sweep   On involved side, pump _4__ times from armpit along side of trunk to hip crease.  Now gently stretch skin from the involved side to the uninvolved side across the chest at the shoulder line.  Repeat that 4 times.  Draw an imaginary diagonal line from upper outer breast through the nipple area toward lower inner breast.  Direct fluid upward and inward from this line toward the pathway across your upper chest .  Do this in three rows to treat all of the upper inner breast tissue, and do each row 3-4x.      Direct fluid to treat all of lower outer breast tissue downward and outward toward      pathway that is aimed at the left groin.  Finish by doing the pathways as described above going from your involved armpit to the same side groin and going across your upper chest from the involved shoulder to the uninvolved shoulder.  Repeat the steps above where you do circles in your left groin and right armpit. Copyright  VHI. All rights reserved.    Cancer Rehab [phone]

## 2023-04-09 NOTE — Patient Instructions (Signed)
Self manual lymph drainage: Perform this sequence once a day.  Only give enough pressure no your skin to make the skin move.  Diaphragmatic - Supine   Inhale through nose making navel move out toward hands. Exhale through puckered lips, hands follow navel in. Repeat _5__ times. Rest _10__ seconds between repeats.   Copyright  VHI. All rights reserved.  Hug yourself.  Do circles at your neck just above your collarbones.  Repeat this 10 times.  Axilla - One at a Time   Using full weight of flat hand and fingers at center of uninvolved armpit, make _10__ in-place circles.   Copyright  VHI. All rights reserved.  LEG: Inguinal Nodes Stimulation   With small finger side of hand against hip crease on involved side, gently perform circles at the crease. Repeat __10_ times.   Copyright  VHI. All rights reserved.  Axilla to Inguinal Nodes - Sweep   On involved side, sweep _4__ times from armpit along side of trunk to hip crease.  Now gently stretch skin from the involved side to the uninvolved side across the chest at the shoulder line.  Repeat that 4 times.  Draw an imaginary diagonal line from upper outer breast through the nipple area toward lower inner breast.  Direct fluid upward and inward from this line toward the pathway across your upper chest .  Do this in three rows to treat all of the upper inner breast tissue, and do each row 3-4x.      Direct fluid to treat all of lower outer breast tissue downward and outward toward pathway that is aimed at the left groin.  Finish by doing the pathways as described above going from your involved armpit to the same side groin and going across your upper chest from the involved shoulder to the uninvolved shoulder.  Repeat the steps above where you do circles in your left groin and right armpit. Copyright  VHI. All rights reserved.

## 2023-04-10 ENCOUNTER — Ambulatory Visit (INDEPENDENT_AMBULATORY_CARE_PROVIDER_SITE_OTHER): Payer: Medicare HMO | Admitting: Adult Health

## 2023-04-10 ENCOUNTER — Encounter: Payer: Self-pay | Admitting: Adult Health

## 2023-04-10 VITALS — BP 165/96 | HR 88 | Ht 65.0 in | Wt 131.8 lb

## 2023-04-10 DIAGNOSIS — E539 Vitamin B deficiency, unspecified: Secondary | ICD-10-CM | POA: Diagnosis not present

## 2023-04-10 DIAGNOSIS — F0280 Dementia in other diseases classified elsewhere without behavioral disturbance: Secondary | ICD-10-CM

## 2023-04-10 DIAGNOSIS — E039 Hypothyroidism, unspecified: Secondary | ICD-10-CM

## 2023-04-10 DIAGNOSIS — G309 Alzheimer's disease, unspecified: Secondary | ICD-10-CM

## 2023-04-10 DIAGNOSIS — E559 Vitamin D deficiency, unspecified: Secondary | ICD-10-CM | POA: Diagnosis not present

## 2023-04-10 NOTE — Progress Notes (Signed)
[facility] [address] [place]. [place] [address] [phone]       OFFICE FOLLOW-UP VISIT NOTE  Angelica [name] Date of Birth:  [birth year] Medical Record Number:  [id number]    Primary neurologist: Dr. Pearlean Brownie Reason for visit: Stroke and memory loss   Chief Complaint  Patient presents with   Follow-up    Patient in room #2 with her daughter. Patient states she is well and stable with no new concerns.      HPI:   Update 04/10/2023 JM: Returns for follow-up visit accompanied by her daughter.  Daughter notes some decline in memory as well as some increased agitation, at times will refuse to take medications and can get quickly agitated.  When daughter is sleeping at this, she quickly becomes agitated and says she always takes her medications.  Reports maintaining ADLs independently.  Continues to live with her other daughter who is not present today.  She occasionally does cognitive exercises such as puzzle books. Reports she tries to stay active by walking around her home. Ambulates without AD, denies any recent falls. Reports she sleeps well at night. Report appetite is good.  No other behavioral concerns.  Remains on Namenda without side effects.  Denies new stroke/TIA symptoms.  Blood pressure elevated today, does not routinely monitor at home, asymptomatic.  Routinely follows with PCP Dr. Jonny Ruiz.  No further questions or concerns at this time.     History provided for reference purposes only Update 10/01/2022 Dr. Pearlean Brownie: She returns for follow-up after last visit 6 months ago.  She is accompanied by her daughter.  Patient is still living with the daughter.  Continues to have short-term memory difficulties and poor recall of recent events.  She this is still mostly independent in activities like bathing, cleaning, dressing herself and feeding herself.  The infection is improved breakfast.  She has not been driving.  She has not exhibited any unsafe behavior,  agitation, delusions or hallucinations.  She does not wander away or get lost.  She has tolerated increasing the dose of Namenda to 10 mg twice daily at last visit but daughter has not noticed any improvement with the increased dose but she has not noticed any worsening either.  On mental status exam testing today she scored 14/30 which is not significantly different from.  15/30 at last visit  Update 04/17/2022 Dr. Pearlean Brownie: : She returns for follow-up after last visit 8 months ago.  She is accompanied by her daughter.  They feel her memory difficulties are more or less unchanged however on objective testing she has shown some decline on cognitive testing in the office today and Mini-Mental exam score was 15/30 compared to 18/30 at last visit.  She denies any delusions, hallucinations, agitation or unsafe behavior..  She is bothered by left hip pain and has started physical therapy for that.  Her cancer is stable and she has an upcoming visit to see Dr. Arbutus Ped next month.  He is on Namenda 5 mg twice daily which is tolerating well without side effects but for unclear reasons she has never tried a higher dose.  She had EEG done on 08/08/2021 which was normal.  CT angiogram on 08/10/2021 showed mild tortuosity of cervical carotid with no large vessel stenosis or occlusion.  She has no new complaints today.  Initial visit 3/25 2023 Dr. Pearlean Brownie: [name] is a pleasant 82 year old African-American lady with initial office consultation visit.  She is accompanied by 2 of her daughters.  History is obtained from them and review of electronic medical records and I have personally reviewed pertinent available imaging films in PACS.  She is a 82 year old African-American lady with past medical history of known lung cancer s/p chemotherapy, radiation.  Acid reflux, hypertension and memory loss.  Patient has had for the last 6 to 9 months progressive decline in memory and cognition.  Her short-term memory is quite poor though  long-term he is fine.  He needs constant reminders and repetition for anything-year-old.  She does not do well with change.  She lives at home with her daughter and is mostly independent in all activities of daily living.  Her daughter is not in charge of her primary and arranging her medicines.  She has not exhibited any unsafe behaviors except a while back she left the faucet on and with the whole bathroom.  She denies any, delusions, hallucinations, agitation.  She does not wander off.  She has no prior history of significant head injury, loss of consciousness, seizures, stroke or TIA.  She and her daughter specifically denies any history of slurred speech, aphasia, vertigo, diplopia, focal extremity weakness, gait imbalance.  She had an MRI scan of the brain done on 07/18/2021 which I personally reviewed and shows only mild changes of small vessel disease and generalized atrophy.  I do not see a definite right cerebellar stroke but the radiologist has called 82 which apparently was not seen on previous MRI from 06/14/2020.  Patient has since been started on Plavix by Dr. Jonny Ruiz and is tolerating it well without bruising or bleeding.  Blood pressure is usually better controlled today it is elevated at 156/82.  Lab work on 07/18/2021 showed LDL cholesterol 97 mg percent, hemoglobin A1c 5.8.  Vitamin B12, vitamin D and free T4 levels are all normal.  She has not yet had an EEG.  She was started on Namenda starter pack presently taking 5 mg once a day.  Daughters are concerned about her gastric reflux and did not want to try Aricept.  On Mini-Mental status exam today she scored 18/30 and clock drawing 3/4.  On 07/20/2021 she scored 11/30 on MoCA testing with our urology office Family history of Alzheimer's in her father.       ROS:   14 system review of systems is positive for memory loss, confusion and all other systems negative  PMH:  Past Medical History:  Diagnosis Date   Arthritis    Asthma    Bowel  obstruction (HCC)    Cancer (HCC)    Environmental allergies    GERD (gastroesophageal reflux disease)    Glaucoma 05/09/2017   H. pylori infection    History of radiation therapy 07/05/20-08/05/20   Left lung IMRT Dr. Roselind Messier    HLD (hyperlipidemia) 01/16/2019   Hypertension    Iron deficiency anemia    Osteopenia 05/26/2017   Thyroid disease    Urticaria 07/25/2018    Social History:  Social History   Socioeconomic History   Marital status: Widowed    Spouse name: Not on file   Number of children: 4   Years of education: 3   Highest education level: 11th grade  Occupational History   Occupation: retired  Tobacco Use   Smoking status: Former    Types: Cigarettes   Smokeless tobacco: Never   Tobacco comments:    quit 60 years ago  Vaping Use   Vaping status: Never Used  Substance and Sexual Activity   Alcohol use: No  Drug use: No   Sexual activity: Never  Other Topics Concern   Not on file  Social History Narrative   Right handed   Caffeine on occ   One story home   Social Determinants of Health   Financial Resource Strain: Low Risk  (10/02/2022)   Overall Financial Resource Strain (CARDIA)    Difficulty of Paying Living Expenses: Not hard at all  Food Insecurity: Patient Unable To Answer (10/05/2022)   Hunger Vital Sign    Worried About Running Out of Food in the Last Year: Patient unable to answer    Ran Out of Food in the Last Year: Patient unable to answer  Transportation Needs: No Transportation Needs (10/05/2022)   PRAPARE - Administrator, Civil Service (Medical): No    Lack of Transportation (Non-Medical): No  Physical Activity: Unknown (10/02/2022)   Exercise Vital Sign    Days of Exercise per Week: 0 days    Minutes of Exercise per Session: Not on file  Stress: No Stress Concern Present (10/02/2022)   [employer] of Occupational Health - Occupational Stress Questionnaire    Feeling of Stress : Not at all  Social Connections:  Moderately Isolated (10/02/2022)   Social Connection and Isolation Panel [NHANES]    Frequency of Communication with Friends and Family: More than three times a week    Frequency of Social Gatherings with Friends and Family: More than three times a week    Attends Religious Services: More than 4 times per year    Active Member of [employer] or Organizations: No    Attends Banker Meetings: Not on file    Marital Status: Widowed  Intimate Partner Violence: Not At Risk (10/05/2022)   Humiliation, Afraid, Rape, and Kick questionnaire    Fear of Current or Ex-Partner: No    Emotionally Abused: No    Physically Abused: No    Sexually Abused: No    Medications:   Current Outpatient Medications on File Prior to Visit  Medication Sig Dispense Refill   montelukast (SINGULAIR) 10 MG tablet TAKE 1 TABLET BY MOUTH AT BEDTIME 30 tablet 0   acetaminophen (TYLENOL) 500 MG tablet Take 1,000 mg by mouth every 6 (six) hours as needed for moderate pain.     albuterol (VENTOLIN HFA) 108 (90 Base) MCG/ACT inhaler Inhale 2 puffs into the lungs every 4 (four) hours as needed for wheezing or shortness of breath. 18 g 1   alum & mag hydroxide-simeth (MAALOX/MYLANTA) 200-200-20 MG/5ML suspension Take 15 mLs by mouth every 6 (six) hours as needed for indigestion or heartburn.     clopidogrel (PLAVIX) 75 MG tablet Take 1 tablet by mouth once daily 90 tablet 3   diclofenac Sodium (VOLTAREN) 1 % GEL Apply 4 g topically 4 (four) times daily. (Patient taking differently: Apply 4 g topically 4 (four) times daily as needed (pain).) 100 g 0   EPINEPHrine (EPIPEN 2-PAK) 0.3 mg/0.3 mL IJ SOAJ injection Use as directed for severe allergic reaction 2 each 1   EQ ALLERGY RELIEF, CETIRIZINE, 10 MG tablet Take 1 tablet by mouth once daily 90 tablet 0   fluticasone (FLONASE) 50 MCG/ACT nasal spray Place 1 spray into both nostrils in the morning and at bedtime. (Patient taking differently: Place 1 spray into both nostrils  daily.) 16 g 5   furosemide (LASIX) 40 MG tablet Take 1 tablet (40 mg total) by mouth daily. (Patient taking differently: Take 40 mg by mouth daily as needed for  edema.) 30 tablet 11   ipratropium (ATROVENT) 0.06 % nasal spray Place 2 sprays into both nostrils 4 (four) times daily as needed (nasal drainage). (Patient taking differently: Place 2 sprays into both nostrils daily.) 15 mL 5   levothyroxine (SYNTHROID) 50 MCG tablet TAKE 1 TABLET BY MOUTH ONCE DAILY BEFORE BREAKFAST 90 tablet 3   Magnesium Hydroxide (PHILLIPS MILK OF MAGNESIA PO) Take 15-30 mLs by mouth daily as needed (for constipation).     memantine (NAMENDA) 10 MG tablet Take 1 tablet by mouth twice daily 60 tablet 0   ondansetron (ZOFRAN-ODT) 4 MG disintegrating tablet Take 1 tablet (4 mg total) by mouth every 8 (eight) hours as needed for nausea or vomiting. 10 tablet 0   pantoprazole (PROTONIX) 40 MG tablet Take 1 tablet by mouth once daily 90 tablet 2   potassium chloride SA (KLOR-CON M) 20 MEQ tablet Take 2 tablets (40 mEq total) by mouth daily as needed. Take potasium days you take lasix. 60 tablet 5   pravastatin (PRAVACHOL) 80 MG tablet Take 1 tablet by mouth once daily 90 tablet 0   SYMBICORT 80-4.5 MCG/ACT inhaler INHALE 2 PUFFS BY MOUTH IN THE MORNING AND AT BEDTIME (Patient taking differently: Inhale into the lungs See admin instructions. Inhale 2 puffs once a day, may take a second dose of 2 puffs when needed for shortness of breath) 10.2 g 5   Current Facility-Administered Medications on File Prior to Visit  Medication Dose Route Frequency Provider Last Rate Last Admin   Benralizumab SOSY 30 mg  30 mg Subcutaneous Q8 Weeks Marcelyn Bruins, MD   30 mg at 04/03/23 1010    Allergies:   Allergies  Allergen Reactions   Asa Buff (Mag [Buffered Aspirin] Other (See Comments)    Excessive sweating   Ensure [Nutritional Supplements]     Or boost - upset stomach    Fish Allergy Other (See Comments)    Unknown  reaction, but allergic   Other Other (See Comments)    Gelcaps; gel-containing capsules; extended-release medication - upset stomach    Peanut-Containing Drug Products Other (See Comments)    From allergy test   Penicillins Itching   Shellfish-Derived Products Other (See Comments)    From allergy test   Strawberry Extract Nausea And Vomiting and Other (See Comments)    From allergy test     Physical Exam Today's Vitals   04/10/23 1041  BP: (!) 165/96  Pulse: 88  Weight: 131 lb 12.8 oz (59.8 kg)  Height: 5\' 5"  (1.651 m)   Body mass index is 21.93 kg/m.  General: Frail elderly African-American lady seated, in no evident distress  Neurologic Exam Mental Status: Awake and fully alert. Oriented to place and time. Recent and remote memory  poor. Attention span, concentration and fund of knowledge diminished. Mood and affect appropriate although could quickly became agitated Cranial Nerves: Pupils equal, briskly reactive to light. Extraocular movements full without nystagmus. Visual fields full to confrontation. Hearing intact. Facial sensation intact. Face, tongue, palate moves normally and symmetrically.  Motor: Normal bulk and tone. Normal strength in all tested extremity muscles. Sensory.: intact to touch , pinprick , position and vibratory sensation.  Coordination: Rapid alternating movements normal in all extremities. Finger-to-nose and heel-to-shin performed accurately bilaterally. Gait and Station: Arises from chair without difficulty. Stance is normal. Gait demonstrates favoring of the right knee due to pain  Reflexes: 1+ and symmetric. Toes downgoing.       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  Abnormal report of MRI of the brain raising concerns of cerebellar stroke but not convincing by my assessment and patient has no clinical history of TIA or stroke.  Vascular risk factors of age, mild hyperlipidemia and recent lung cancer.      Cognition declined some since prior visit per family.  MMSE stable.  Has been experiencing some agitation at times.  Discussed ways to help improve or reduce agitation.  If this becomes more persistent or starts to become a threat to herself or others, can consider medication management.  Continuation of Namenda 10 mg twice daily.  Consider initiating Aricept - daughter wishes to speak to her sister who patient currently lives with. Will call if interested in pursuing. Prior B12 level in 09/2022 155, will repeat today, not currently on supplement. Will also obtain TSH and vit D to ensure no other causes contributing to memory decline.    Follow-up in 6 months or call earlier if needed    I spent 30 minutes of face-to-face and non-face-to-face time with patient and daughter.  This included previsit chart review, lab review, study review, order entry, electronic health record documentation, patient and daughter education and discussion regarding above diagnoses and treatment plan and answered all other questions to patient and daughters satisfaction  Ihor Austin, [facility]  [facility] [address] [place], [place] [address]  Phone [phone] Fax [phone] Note: This document was prepared with digital dictation and possible smart phrase technology. Any transcriptional errors that result from this  process are unintentional.

## 2023-04-10 NOTE — Patient Instructions (Addendum)
Your Plan:  Continue Namenda 10mg  twice daily  Consider use of Aricept in combination with Namenda which can further help slow memory decline  - please let me know if you are interested in starting  Please continue to monitor behaviors, if this becomes more persistent, please let us know  We will check lab work today - I will notify you via MyChart regarding the results      Follow up in 6 months or call earlier if needed      Thank you for coming to see [place] at [facility]. I hope we have been able to provide you high quality care today.  You may receive a patient satisfaction survey over the next few weeks. We would appreciate your feedback and comments so that we may continue to improve ourselves and the health of our patients.

## 2023-04-11 ENCOUNTER — Encounter: Payer: Self-pay | Admitting: Physical Therapy

## 2023-04-11 ENCOUNTER — Ambulatory Visit: Payer: Medicare HMO | Admitting: Physical Therapy

## 2023-04-11 DIAGNOSIS — L599 Disorder of the skin and subcutaneous tissue related to radiation, unspecified: Secondary | ICD-10-CM | POA: Diagnosis not present

## 2023-04-11 DIAGNOSIS — I890 Lymphedema, not elsewhere classified: Secondary | ICD-10-CM | POA: Diagnosis not present

## 2023-04-11 LAB — THYROID PANEL WITH TSH
Free Thyroxine Index: 3 (ref 1.2–4.9)
T3 Uptake Ratio: 38 % (ref 24–39)
T4, Total: 7.8 ug/dL (ref 4.5–12.0)
TSH: 2.4 u[IU]/mL (ref 0.450–4.500)

## 2023-04-11 LAB — VITAMIN B12: Vitamin B-12: 387 pg/mL (ref 232–1245)

## 2023-04-11 LAB — VITAMIN D 25 HYDROXY (VIT D DEFICIENCY, FRACTURES): Vit D, 25-Hydroxy: 32 ng/mL (ref 30.0–100.0)

## 2023-04-11 NOTE — Therapy (Signed)
OUTPATIENT PHYSICAL THERAPY  UPPER EXTREMITY ONCOLOGY TREATMENT  Patient Name: Angelica [name] MRN: [id number] DOB:01-21-1941, 82 y.o., female Today's Date: 04/11/2023  END OF SESSION:  PT End of Session - 04/11/23 1452     Visit Number 5    Number of Visits 9    Date for PT Re-Evaluation 04/17/23    PT Start Time 1350    PT Stop Time 1454    PT Time Calculation (min) 64 min    Activity Tolerance Patient tolerated treatment well    Behavior During Therapy [facility] for tasks assessed/performed              Past Medical History:  Diagnosis Date   Arthritis    Asthma    Bowel obstruction (HCC)    Cancer (HCC)    Environmental allergies    GERD (gastroesophageal reflux disease)    Glaucoma 05/09/2017   H. pylori infection    History of radiation therapy 07/05/20-08/05/20   Left lung IMRT Dr. Roselind Messier    HLD (hyperlipidemia) 01/16/2019   Hypertension    Iron deficiency anemia    Osteopenia 05/26/2017   Thyroid disease    Urticaria 07/25/2018   Past Surgical History:  Procedure Laterality Date   ABDOMINAL HYSTERECTOMY     BOWEL RESECTION N/A 05/23/2020   Procedure: SMALL BOWEL REPAIR;  Surgeon: Sheliah Hatch De Blanch, MD;  Location: [facility] OR;  Service: General;  Laterality: N/A;   BREAST BIOPSY     BRONCHIAL BIOPSY  06/10/2020   Procedure: BRONCHIAL BIOPSIES;  Surgeon: [name], DO;  Location: [facility];  Service: Pulmonary;;   BRONCHIAL BRUSHINGS  06/10/2020   Procedure: BRONCHIAL BRUSHINGS;  Surgeon: [name], DO;  Location: [facility];  Service: Pulmonary;;   BRONCHIAL NEEDLE ASPIRATION BIOPSY  06/10/2020   Procedure: BRONCHIAL NEEDLE ASPIRATION BIOPSIES;  Surgeon: [name], DO;  Location: [facility];  Service: Pulmonary;;   BRONCHIAL WASHINGS  06/10/2020   Procedure: BRONCHIAL WASHINGS;  Surgeon: [name], DO;  Location: [facility];  Service: Pulmonary;;   COLON SURGERY     DIAGNOSTIC LARYNGOSCOPY N/A 05/23/2020   Procedure: ATTEMPTED  DIAGNOSTIC LAPAROSCOPY WITH ADHESIONS;  Surgeon: Sheliah Hatch, De Blanch, MD;  Location: [facility] OR;  Service: General;  Laterality: N/A;   IR IMAGING GUIDED PORT INSERTION  07/15/2020   IR REMOVAL TUN ACCESS W/ PORT W/O FL MOD SED  01/16/2023   KNEE SURGERY     LAPAROTOMY N/A 04/18/2015   Procedure: Exploratory laparotomy with lysis of adhesions, possible bowel resection;  Surgeon: Axel Filler, MD;  Location: [facility] OR;  Service: General;  Laterality: N/A;   LAPAROTOMY N/A 05/23/2020   Procedure: EXPLORATORY LAPAROTOMY;  Surgeon: Rodman Pickle, MD;  Location: [facility] OR;  Service: General;  Laterality: N/A;   LYSIS OF ADHESION N/A 05/23/2020   Procedure: LYSIS OF ADHESION;  Surgeon: Sheliah Hatch De Blanch, MD;  Location: [facility] OR;  Service: General;  Laterality: N/A;   NASAL SINUS SURGERY     THORACENTESIS Left 12/03/2022   Procedure: THORACENTESIS;  Surgeon: [name], DO;  Location: [facility];  Service: Pulmonary;  Laterality: Left;   THYROID SURGERY     VIDEO BRONCHOSCOPY WITH ENDOBRONCHIAL NAVIGATION Bilateral 06/10/2020   Procedure: VIDEO BRONCHOSCOPY WITH ENDOBRONCHIAL NAVIGATION;  Surgeon: [name], DO;  Location: [facility];  Service: Pulmonary;  Laterality: Bilateral;   VIDEO BRONCHOSCOPY WITH ENDOBRONCHIAL ULTRASOUND  06/10/2020   Procedure: VIDEO BRONCHOSCOPY WITH ENDOBRONCHIAL ULTRASOUND;  Surgeon: [name], DO;  Location:  [facility];  Service: Pulmonary;;   Patient Active Problem List   Diagnosis Date Noted   Mastodynia of left breast 02/13/2023   Pleural effusion 11/21/2022   Swelling of breast 11/17/2022   Dyspnea 11/14/2022   Cellulitis of left breast 10/17/2022   Iron deficiency anemia 10/11/2022   Acute respiratory failure with hypoxia (HCC) 10/05/2022   Bilateral pleural effusion 10/05/2022   History of stroke 10/05/2022   Weakness 10/05/2022   Edema 10/05/2022   Alzheimer's disease (HCC) 09/06/2022   Ex-smoker 09/06/2022   Hypothyroidism 09/06/2022    Right leg pain 07/13/2021   Dementia (HCC) 07/13/2021   RLQ abdominal pain 07/13/2021   Encounter for well adult exam with abnormal findings 06/10/2021   Anosmia 05/01/2021   Chronic ethmoidal sinusitis 02/14/2021   Debility 02/01/2021   Gait disorder 02/01/2021   Malignant neoplasm of bronchus and lung (HCC)    Port-A-Cath in place 11/02/2020   Adenocarcinoma of left lung, stage 3 (HCC) 06/22/2020   Lung mass    Pulmonary nodule 05/17/2020   Iron deficiency 05/03/2020   Swelling of lower extremity 07/30/2019   Ganglion cyst of right foot 01/16/2019   HLD (hyperlipidemia) 01/16/2019   LPRD (laryngopharyngeal reflux disease) 07/25/2018   Constipation 08/19/2017   Asthma, severe persistent, well-controlled 06/04/2017   Chronic pansinusitis 06/04/2017   Nasal polyposis 06/04/2017   Osteopenia 05/26/2017   GERD (gastroesophageal reflux disease)    H. pylori infection    Other allergic rhinitis 03/20/2016   Malnutrition of moderate degree 04/11/2015   HTN (hypertension) 04/10/2015    PCP: Oliver Barre, MD  REFERRING PROVIDER: Harriette Bouillon, MD  REFERRING DIAG: I89.0 (ICD-10-CM) - Lymphedema, not elsewhere classified  THERAPY DIAG:  Lymphedema, not elsewhere classified  Disorder of the skin and subcutaneous tissue related to radiation, unspecified  ONSET DATE: pt unable to give a date - "it has been a while"  Rationale for Evaluation and Treatment: Rehabilitation  SUBJECTIVE:                                                                                                                                                                                           SUBJECTIVE STATEMENT: I have been doing the massage at night. I am wearing the bra.   PERTINENT HISTORY: history of non-small cell lung cancer with most recent CT 10/05/2022 demonstrating diffuse anasarca, as well as bilateral pleural effusions, hx of radiation to chest,  hx of breast biopsy (benign), dementia and  Alzheimer's   PAIN:  Are you having pain? No  PRECAUTIONS: Other: chart states pt has Alzheimer's/dementia - pt unable to verify diagnosis in chart, no one  else present with pt to assist  RED FLAGS: Unable to ask as pt has memory issues and no one present at appointment  WEIGHT BEARING RESTRICTIONS: No  FALLS:  Has patient fallen in last 6 months? No  LIVING ENVIRONMENT: Lives with: lives with their daughter Lives in: House/apartment Has following equipment at home: Dan Humphreys - 4 wheeled pt reports she does not use it  OCCUPATION: retired  LEISURE: pt reports she tries to walk  HAND DOMINANCE: right   PRIOR LEVEL OF FUNCTION: Independent does have memory issues  PATIENT GOALS: pt reports she does not know   OBJECTIVE: Note: Objective measures were completed at Evaluation unless otherwise noted.  COGNITION: Overall cognitive status: Within functional limits for tasks assessed   PALPATION: Fibrosis present in inferior L breast  OBSERVATIONS / OTHER ASSESSMENTS: L significantly larger than R breast, approximately double the size  POSTURE: forward head, rounded shoulders  UPPER EXTREMITY AROM/PROM: WFL  CERVICAL AROM: All within normal limits:    Percent limited  Flexion WFL  Extension 75% limited  Right lateral flexion WFL  Left lateral flexion 25% limited  Right rotation WFL  Left rotation WFL    LYMPHEDEMA ASSESSMENTS:   LANDMARK RIGHT  eval  At axilla  28.5  15 cm proximal to olecranon process 25.6  10 cm proximal to olecranon process 25.5  Olecranon process 22.5  15 cm proximal to ulnar styloid process 22.1  10 cm proximal to ulnar styloid process 18.5  Just proximal to ulnar styloid process 14.5  Across hand at thumb web space 18.8  At base of 2nd digit 6  (Blank rows = not tested)  LANDMARK LEFT  eval  At axilla  28.5  15 cm proximal to olecranon process 26.5  10 cm proximal to olecranon process 25.9  Olecranon process 24  15 cm proximal  to ulnar styloid process 21.5  10 cm proximal to ulnar styloid process 18.1  Just proximal to ulnar styloid process 14.5  Across hand at thumb web space 18.2  At base of 2nd digit 5.6  (Blank rows = not tested)  LLIS:  Flowsheet Row Outpatient Rehab from 03/20/2023 in [facility]  Lymphedema Life Impact Scale Total Score 14.71 %      14.71  TODAY'S TREATMENT:                                                                                                                                          DATE:  04/11/23: In supine: Short neck, 5 diaphragmatic breaths, R axillary nodes and establishment of interaxillary pathway, L inguinal nodes and establishment of axilloinguinal pathway, then L breast moving fluid towards pathways spending extra time in any areas of fibrosis then retracing all steps.   04/09/23: In supine: Short neck, 5 diaphragmatic breaths, R axillary nodes and establishment of interaxillary pathway, L inguinal nodes and establishment of axilloinguinal  pathway, then L breast moving fluid towards pathways spending extra time in any areas of fibrosis then retracing all steps. Instructed pt and her daughter throughout and had pt's daughter return demonstrate each step while providing v/c and t/c for correct skin stretch technique and sequence. Issued handout. Educated pt and daughter that they can just use the interaxillary pathway if it is easier at home.   04/04/23: Manual Therapy MLD to Lt breast as follows: Short neck, superficial and deep abdominals (continued with instruction of belly breaths, not chest breathing), Lt inguinal nodes and Lt axillo-inguinal anastomosis, Rt axillary nodes, Rt anterior intact thorax, anterior inter-axillary anastomosis, then focused on superior and medial breast redirecting towards anterior anastomosis, then into Rt S/L for focus to lateral and inferior breast and lateral trunk where larger area of visible edema present but  already some reduced from last session so focused here redirecting towards lateral anastomosis, then finished retracing all steps to anastomoses in supine  04/02/23: Self Care Began session with educating daughters and pt in basics of anatomy of lymphatic system and principles of MLD before beginning MLD. Also further discussed compression bra. Showed compression bras to pt and daughters and explained how this will also help decrease lymphedema. Daughter (who works at [employer]) had to call multiple times to get someone on the phone at [facility] [place] and finally got a Dec appt. Advised her of [facility] in [place] if that wasn't too far for them to drive to see if they could get her in sooner. Also discussed and showed swell spots and how pt can benefit from this as well due to amount of fibrosis present in most of breast.  Manual Therapy MLD to Lt breast as follows: Short neck, 5 diaphragmatic breaths (took some time to instruct pt in belly breaths, not chest breathing), Lt inguinal nodes and Lt axillo-inguinal anastomosis, Rt axillary nodes, Rt anterior intact thorax, anterior inter-axillary anastomosis, then focused on superior and medial breast redirecting towards anterior anastomosis, then into Rt S/L for focus to lateral and inferior breast and lateral trunk where larger area of visible edema present so focused here redirecting towards lateral anastomosis, then finished retracing all steps in supine to anastomoses and began instructing daughter while performing in addition to instruction offered at beginning of session.   03/20/23: None today as pt was unaccompanied and has dementia, will begin at next session when her daughter can be here    PATIENT EDUCATION:  Education details: lymphedema and how it is managed with manual lymphatic drainage and compression bra and swell spot or foam; began instruction of self MLD Person educated: Patient, daughter Education method: Explanation and  Handouts, demonstration and verbal and tactile cues Education comprehension: verbalized understanding, will benefit from further review  HOME EXERCISE PROGRAM: Obtain compression bra  ASSESSMENT:  CLINICAL IMPRESSION: Continued with MLD to L breast. Fibrosis has improved significantly since evaluation. Pt has been doing self MLD at night and her daughter also performs MLD. She has also been wearing her compression bra frequently.   OBJECTIVE IMPAIRMENTS: decreased knowledge of condition, decreased knowledge of use of DME, increased edema, and postural dysfunction.   ACTIVITY LIMITATIONS:  none  PARTICIPATION LIMITATIONS:  none  PERSONAL FACTORS: Time since onset of injury/illness/exacerbation and 1 comorbidity: dementia  are also affecting patient's functional outcome.   REHAB POTENTIAL: Good  CLINICAL DECISION MAKING: Stable/uncomplicated  EVALUATION COMPLEXITY: Low  GOALS: Goals reviewed with patient? Yes  SHORT TERM GOALS=LONG TERM GOALS Target date: 04/17/23  Pt  will obtain a compression bra for long term management of L breast lymphedema.  Baseline: Goal status: MET 04/11/23   2.  Pt's daughters will be independent in breast MLD for long term management of lymphedema.  Baseline:  Goal status: INITIAL  3.  Pt will demonstrate a 50% improvement in fibrosis in L breast to decrease risk of infection.  Baseline:  Goal status: INITIAL  PLAN:  PT FREQUENCY: 2x/week  PT DURATION: 4 weeks  PLANNED INTERVENTIONS: 97140- Manual therapy, [id number]- Orthotic Fit/training, Patient/Family education, Therapeutic exercises, Therapeutic activity, and Self Care  PLAN FOR NEXT SESSION: Cont MLD to L breast and instruct daughter having her return demo,   Milagros Loll [place], PT 04/11/2023, 2:56 PM  Self manual lymph drainage: Perform this sequence once a day.  Only give enough pressure to your skin to make the skin move.  Diaphragmatic - Supine   Inhale through nose making  navel move out toward hands. Exhale through puckered lips, hands follow navel in. Repeat _5__ times. Rest _10__ seconds between repeats.   Copyright  VHI. All rights reserved.  Hug yourself.  Do circles at your neck just above your collarbones.  Repeat this 10 times.  Axilla - One at a Time   Using full weight of flat hand and fingers at center of uninvolved armpit, make _10__ in-place circles.   Copyright  VHI. All rights reserved.  LEG: Inguinal Nodes Stimulation   With small finger side of hand against hip crease on involved side, gently perform circles at the crease. Repeat __10_ times.   Copyright  VHI. All rights reserved.  Axilla to Inguinal Nodes - Sweep   On involved side, pump _4__ times from armpit along side of trunk to hip crease.  Now gently stretch skin from the involved side to the uninvolved side across the chest at the shoulder line.  Repeat that 4 times.  Draw an imaginary diagonal line from upper outer breast through the nipple area toward lower inner breast.  Direct fluid upward and inward from this line toward the pathway across your upper chest .  Do this in three rows to treat all of the upper inner breast tissue, and do each row 3-4x.      Direct fluid to treat all of lower outer breast tissue downward and outward toward      pathway that is aimed at the left groin.  Finish by doing the pathways as described above going from your involved armpit to the same side groin and going across your upper chest from the involved shoulder to the uninvolved shoulder.  Repeat the steps above where you do circles in your left groin and right armpit. Copyright  VHI. All rights reserved.    Cancer Rehab [phone]

## 2023-04-12 ENCOUNTER — Telehealth: Payer: Self-pay | Admitting: Internal Medicine

## 2023-04-12 MED ORDER — AMLODIPINE BESYLATE 5 MG PO TABS: 5.0000 mg | ORAL_TABLET | Freq: Every day | ORAL | 3 refills | Status: DC

## 2023-04-12 NOTE — Telephone Encounter (Signed)
Ok to restart at amlodipine 5 mg per day - done erx  Will need f/u at next ov

## 2023-04-12 NOTE — Telephone Encounter (Signed)
Patient's daughter called and said her mother had an appointment with another doctor yesterday and they said her blood pressure was elevated. Patient was previously on amlodipine, but was taken off of it during a past hospital stay. They would like to know what Dr. Jonny Ruiz would like for her to do. Best callback is [phone].

## 2023-04-12 NOTE — Telephone Encounter (Signed)
Called and let Pt daughter know.

## 2023-04-13 ENCOUNTER — Other Ambulatory Visit: Payer: Self-pay | Admitting: Allergy

## 2023-04-13 ENCOUNTER — Other Ambulatory Visit: Payer: Self-pay | Admitting: Neurology

## 2023-04-15 ENCOUNTER — Ambulatory Visit: Payer: Medicare HMO | Admitting: Physical Therapy

## 2023-04-15 NOTE — Telephone Encounter (Signed)
Rx refilled per last office visit note, "Continue Namenda 10mg  twice daily."

## 2023-04-23 ENCOUNTER — Ambulatory Visit: Payer: Medicare HMO | Admitting: Physical Therapy

## 2023-04-25 ENCOUNTER — Ambulatory Visit: Payer: Medicare HMO | Attending: Surgery

## 2023-04-25 DIAGNOSIS — I890 Lymphedema, not elsewhere classified: Secondary | ICD-10-CM | POA: Insufficient documentation

## 2023-04-25 DIAGNOSIS — L599 Disorder of the skin and subcutaneous tissue related to radiation, unspecified: Secondary | ICD-10-CM | POA: Insufficient documentation

## 2023-04-25 NOTE — Therapy (Signed)
OUTPATIENT PHYSICAL THERAPY  UPPER EXTREMITY ONCOLOGY TREATMENT  Patient Name: Angelica [name] MRN: [id number] DOB:Apr 15, 1941, 82 y.o., female Today's Date: 04/25/2023  END OF SESSION:  PT End of Session - 04/25/23 1453     Visit Number 6    Number of Visits 9    Date for PT Re-Evaluation 06/06/23    PT Start Time 1405    PT Stop Time 1453    PT Time Calculation (min) 48 min    Activity Tolerance Patient tolerated treatment well    Behavior During Therapy Angelica [name] for tasks assessed/performed              Past Medical History:  Diagnosis Date   Arthritis    Asthma    Bowel obstruction (HCC)    Cancer (HCC)    Environmental allergies    GERD (gastroesophageal reflux disease)    Glaucoma 05/09/2017   H. pylori infection    History of radiation therapy 07/05/20-08/05/20   Left lung IMRT Dr. Roselind Messier    HLD (hyperlipidemia) 01/16/2019   Hypertension    Iron deficiency anemia    Osteopenia 05/26/2017   Thyroid disease    Urticaria 07/25/2018   Past Surgical History:  Procedure Laterality Date   ABDOMINAL HYSTERECTOMY     BOWEL RESECTION N/A 05/23/2020   Procedure: SMALL BOWEL REPAIR;  Surgeon: Sheliah Hatch De Blanch, MD;  Location: [facility];  Service: General;  Laterality: N/A;   BREAST BIOPSY     BRONCHIAL BIOPSY  06/10/2020   Procedure: BRONCHIAL BIOPSIES;  Surgeon: [name], DO;  Location: [facility];  Service: Pulmonary;;   BRONCHIAL BRUSHINGS  06/10/2020   Procedure: BRONCHIAL BRUSHINGS;  Surgeon: [name], DO;  Location: [facility];  Service: Pulmonary;;   BRONCHIAL NEEDLE ASPIRATION BIOPSY  06/10/2020   Procedure: BRONCHIAL NEEDLE ASPIRATION BIOPSIES;  Surgeon: [name], DO;  Location: [facility];  Service: Pulmonary;;   BRONCHIAL WASHINGS  06/10/2020   Procedure: BRONCHIAL WASHINGS;  Surgeon: [name], DO;  Location: [facility];  Service: Pulmonary;;   COLON SURGERY     DIAGNOSTIC LARYNGOSCOPY N/A 05/23/2020   Procedure: ATTEMPTED  DIAGNOSTIC LAPAROSCOPY WITH ADHESIONS;  Surgeon: Sheliah Hatch, De Blanch, MD;  Location: [facility];  Service: General;  Laterality: N/A;   IR IMAGING GUIDED PORT INSERTION  07/15/2020   IR REMOVAL TUN ACCESS W/ PORT W/O FL MOD SED  01/16/2023   KNEE SURGERY     LAPAROTOMY N/A 04/18/2015   Procedure: Exploratory laparotomy with lysis of adhesions, possible bowel resection;  Surgeon: Axel Filler, MD;  Location: [facility];  Service: General;  Laterality: N/A;   LAPAROTOMY N/A 05/23/2020   Procedure: EXPLORATORY LAPAROTOMY;  Surgeon: Rodman Pickle, MD;  Location: [facility];  Service: General;  Laterality: N/A;   LYSIS OF ADHESION N/A 05/23/2020   Procedure: LYSIS OF ADHESION;  Surgeon: Sheliah Hatch De Blanch, MD;  Location: [facility] OR;  Service: General;  Laterality: N/A;   NASAL SINUS SURGERY     THORACENTESIS Left 12/03/2022   Procedure: THORACENTESIS;  Surgeon: [name], DO;  Location: [facility];  Service: Pulmonary;  Laterality: Left;   THYROID SURGERY     VIDEO BRONCHOSCOPY WITH ENDOBRONCHIAL NAVIGATION Bilateral 06/10/2020   Procedure: VIDEO BRONCHOSCOPY WITH ENDOBRONCHIAL NAVIGATION;  Surgeon: [name], DO;  [facility]: [facility];  Service: Pulmonary;  Laterality: Bilateral;   VIDEO BRONCHOSCOPY WITH ENDOBRONCHIAL ULTRASOUND  06/10/2020   Procedure: VIDEO BRONCHOSCOPY WITH ENDOBRONCHIAL ULTRASOUND;  Surgeon: [name], DO;  Location:  [facility];  Service: Pulmonary;;   Patient Active Problem List   Diagnosis Date Noted   Mastodynia of left breast 02/13/2023   Pleural effusion 11/21/2022   Swelling of breast 11/17/2022   Dyspnea 11/14/2022   Cellulitis of left breast 10/17/2022   Iron deficiency anemia 10/11/2022   Acute respiratory failure with hypoxia (HCC) 10/05/2022   Bilateral pleural effusion 10/05/2022   History of stroke 10/05/2022   Weakness 10/05/2022   Edema 10/05/2022   Alzheimer's disease (HCC) 09/06/2022   Ex-smoker 09/06/2022   Hypothyroidism 09/06/2022    Right leg pain 07/13/2021   Dementia (HCC) 07/13/2021   RLQ abdominal pain 07/13/2021   Encounter for well adult exam with abnormal findings 06/10/2021   Anosmia 05/01/2021   Chronic ethmoidal sinusitis 02/14/2021   Debility 02/01/2021   Gait disorder 02/01/2021   Malignant neoplasm of bronchus and lung (HCC)    Port-A-Cath in place 11/02/2020   Adenocarcinoma of left lung, stage 3 (HCC) 06/22/2020   Lung mass    Pulmonary nodule 05/17/2020   Iron deficiency 05/03/2020   Swelling of lower extremity 07/30/2019   Ganglion cyst of right foot 01/16/2019   HLD (hyperlipidemia) 01/16/2019   LPRD (laryngopharyngeal reflux disease) 07/25/2018   Constipation 08/19/2017   Asthma, severe persistent, well-controlled 06/04/2017   Chronic pansinusitis 06/04/2017   Nasal polyposis 06/04/2017   Osteopenia 05/26/2017   GERD (gastroesophageal reflux disease)    H. pylori infection    Other allergic rhinitis 03/20/2016   Malnutrition of moderate degree 04/11/2015   HTN (hypertension) 04/10/2015    PCP: Oliver Barre, MD  REFERRING PROVIDER: Harriette Bouillon, MD  REFERRING DIAG: I89.0 (ICD-10-CM) - Lymphedema, not elsewhere classified  THERAPY DIAG:  Lymphedema, not elsewhere classified  Disorder of the skin and subcutaneous tissue related to radiation, unspecified  ONSET DATE: pt unable to give a date - "it has been a while"  Rationale for Evaluation and Treatment: Rehabilitation  SUBJECTIVE:                                                                                                                                                                                           SUBJECTIVE STATEMENT:  The breast is doing well. Its a little swollen. I have been doing the Lymphatic drainage on myself. My daughter has not done it on me yet. Dennie Bible (daughter present today) said her sister needs to practice some more because she is the one who lives with her mom but it is hard for her to get here as  she works until 3:30 and often has to pick up grandkids from school)  PERTINENT HISTORY: history of  non-small cell lung cancer with most recent CT 10/05/2022 demonstrating diffuse anasarca, as well as bilateral pleural effusions, hx of radiation to chest,  hx of breast biopsy (benign), dementia and Alzheimer's   PAIN:  Are you having pain? No  PRECAUTIONS: Other: chart states pt has Alzheimer's/dementia - pt unable to verify diagnosis in chart, no one else present with pt to assist  RED FLAGS: Unable to ask as pt has memory issues and no one present at appointment  WEIGHT BEARING RESTRICTIONS: No  FALLS:  Has patient fallen in last 6 months? No  LIVING ENVIRONMENT: Lives with: lives with their daughter Lives in: House/apartment Has following equipment at home: Dan Humphreys - 4 wheeled pt reports she does not use it  OCCUPATION: retired  LEISURE: pt reports she tries to walk  HAND DOMINANCE: right   PRIOR LEVEL OF FUNCTION: Independent does have memory issues  PATIENT GOALS: pt reports she does not know   OBJECTIVE: Note: Objective measures were completed at Evaluation unless otherwise noted.  COGNITION: Overall cognitive status: Within functional limits for tasks assessed   PALPATION: Fibrosis present in inferior L breast  OBSERVATIONS / OTHER ASSESSMENTS: L significantly larger than R breast, approximately double the size  POSTURE: forward head, rounded shoulders  UPPER EXTREMITY AROM/PROM: WFL  CERVICAL AROM: All within normal limits:    Percent limited  Flexion WFL  Extension 75% limited  Right lateral flexion WFL  Left lateral flexion 25% limited  Right rotation WFL  Left rotation WFL    LYMPHEDEMA ASSESSMENTS:   LANDMARK RIGHT  eval  At axilla  28.5  15 cm proximal to olecranon process 25.6  10 cm proximal to olecranon process 25.5  Olecranon process 22.5  15 cm proximal to ulnar styloid process 22.1  10 cm proximal to ulnar styloid process 18.5   Just proximal to ulnar styloid process 14.5  Across hand at thumb web space 18.8  At base of 2nd digit 6  (Blank rows = not tested)  LANDMARK LEFT  eval  At axilla  28.5  15 cm proximal to olecranon process 26.5  10 cm proximal to olecranon process 25.9  Olecranon process 24  15 cm proximal to ulnar styloid process 21.5  10 cm proximal to ulnar styloid process 18.1  Just proximal to ulnar styloid process 14.5  Across hand at thumb web space 18.2  At base of 2nd digit 5.6  (Blank rows = not tested)  LLIS:  Flowsheet Row Outpatient Rehab from 03/20/2023 in [facility]  Lymphedema Life Impact Scale Total Score 14.71 %      14.71  TODAY'S TREATMENT:                                                                                                                                          DATE:   04/25/2023 Significant fibrosis still noted at inferior medial  breast, and thickening noted laterally but less so. In supine: Short neck, 5 diaphragmatic breaths, R axillary nodes and establishment of interaxillary pathway, L inguinal nodes and establishment of axilloinguinal pathway, then L breast moving fluid towards pathways spending extra time in any areas of fibrosis then retracing all steps. Pt educated in LN activation and medial breast but required frequent TC's/VC's. Daughter Dennie Bible was present and  has practiced 2x's previously while here, but has not done on her mom at home. She has no questions. She wants her sister Vashti Hey to practice again;appt set up for 05/16/23 at 4:00. She spoke with her sister while they were here and Taron agreed to appt on 12/26  04/11/23: In supine: Short neck, 5 diaphragmatic breaths, R axillary nodes and establishment of interaxillary pathway, L inguinal nodes and establishment of axilloinguinal pathway, then L breast moving fluid towards pathways spending extra time in any areas of fibrosis then retracing all steps.   04/09/23: In  supine: Short neck, 5 diaphragmatic breaths, R axillary nodes and establishment of interaxillary pathway, L inguinal nodes and establishment of axilloinguinal pathway, then L breast moving fluid towards pathways spending extra time in any areas of fibrosis then retracing all steps. Instructed pt and her daughter throughout and had pt's daughter return demonstrate each step while providing v/c and t/c for correct skin stretch technique and sequence. Issued handout. Educated pt and daughter that they can just use the interaxillary pathway if it is easier at home.   04/04/23: Manual Therapy MLD to Lt breast as follows: Short neck, superficial and deep abdominals (continued with instruction of belly breaths, not chest breathing), Lt inguinal nodes and Lt axillo-inguinal anastomosis, Rt axillary nodes, Rt anterior intact thorax, anterior inter-axillary anastomosis, then focused on superior and medial breast redirecting towards anterior anastomosis, then into Rt S/L for focus to lateral and inferior breast and lateral trunk where larger area of visible edema present but already some reduced from last session so focused here redirecting towards lateral anastomosis, then finished retracing all steps to anastomoses in supine  04/02/23: Self Care Began session with educating daughters and pt in basics of anatomy of lymphatic system and principles of MLD before beginning MLD. Also further discussed compression bra. Showed compression bras to pt and daughters and explained how this will also help decrease lymphedema. Daughter (who works at [employer]) had to call multiple times to get someone on the phone at [facility] [place] and finally got a Dec appt. Advised her of [facility] in [place] if that wasn't too far for them to drive to see if they could get her in sooner. Also discussed and showed swell spots and how pt can benefit from this as well due to amount of fibrosis present in most of breast.  Manual Therapy MLD  to Lt breast as follows: Short neck, 5 diaphragmatic breaths (took some time to instruct pt in belly breaths, not chest breathing), Lt inguinal nodes and Lt axillo-inguinal anastomosis, Rt axillary nodes, Rt anterior intact thorax, anterior inter-axillary anastomosis, then focused on superior and medial breast redirecting towards anterior anastomosis, then into Rt S/L for focus to lateral and inferior breast and lateral trunk where larger area of visible edema present so focused here redirecting towards lateral anastomosis, then finished retracing all steps in supine to anastomoses and began instructing daughter while performing in addition to instruction offered at beginning of session.   03/20/23: None today as pt was unaccompanied and has dementia, will begin at next session when her daughter  can be here    PATIENT EDUCATION:  Education details: lymphedema and how it is managed with manual lymphatic drainage and compression bra and swell spot or foam; began instruction of self MLD Person educated: Patient, daughter Education method: Explanation and Handouts, demonstration and verbal and tactile cues Education comprehension: verbalized understanding, will benefit from further review  HOME EXERCISE PROGRAM: Obtain compression bra  ASSESSMENT:  CLINICAL IMPRESSION: Continued with MLD to L breast. Significant Fibrosis still present Medially with generalized swelling laterally and some fibrosis. Pt requires constant cueing and has some dementia so would be best for Taron, the daughter who lives with her to have review. She is only able to come at 4:00 and only if she doesn't have to pick up her grandkids. Scheduled for 4:00 on 05/16/23 and Taron called and agreed to appt when her mom was here. OBJECTIVE IMPAIRMENTS: decreased knowledge of condition, decreased knowledge of use of DME, increased edema, and postural dysfunction.   ACTIVITY LIMITATIONS:  none  PARTICIPATION LIMITATIONS:   none  PERSONAL FACTORS: Time since onset of injury/illness/exacerbation and 1 comorbidity: dementia  are also affecting patient's functional outcome.   REHAB POTENTIAL: Good  CLINICAL DECISION MAKING: Stable/uncomplicated  EVALUATION COMPLEXITY: Low  GOALS: Goals reviewed with patient? Yes  SHORT TERM GOALS=LONG TERM GOALS Target date: 04/17/23  Pt will obtain a compression bra for long term management of L breast lymphedema.  Baseline: Goal status: MET 04/11/23   2.  Pt's daughters will be independent in breast MLD for long term management of lymphedema.  Baseline:  Goal status: In progress  3.  Pt will demonstrate a 50% improvement in fibrosis in L breast to decrease risk of infection.  Baseline:  Goal status: IN progress  PLAN:  PT FREQUENCY: 3 visits prn  PT DURATION: 6 weeks  PLANNED INTERVENTIONS: 97140- Manual therapy, [id number]- Orthotic Fit/training, Patient/Family education, Therapeutic exercises, Therapeutic activity, and Self Care  PLAN FOR NEXT SESSION: Cont MLD to L breast and instruct daughter Vashti Hey having her return demo, DC when Vashti Hey is Independent   Waynette Buttery, PT 04/25/2023, 2:54 PM  Self manual lymph drainage: Perform this sequence once a day.  Only give enough pressure to your skin to make the skin move.  Diaphragmatic - Supine   Inhale through nose making navel move out toward hands. Exhale through puckered lips, hands follow navel in. Repeat _5__ times. Rest _10__ seconds between repeats.   Copyright  VHI. All rights reserved.  Hug yourself.  Do circles at your neck just above your collarbones.  Repeat this 10 times.  Axilla - One at a Time   Using full weight of flat hand and fingers at center of uninvolved armpit, make _10__ in-place circles.   Copyright  VHI. All rights reserved.  LEG: Inguinal Nodes Stimulation   With small finger side of hand against hip crease on involved side, gently perform circles at the crease. Repeat  __10_ times.   Copyright  VHI. All rights reserved.  Axilla to Inguinal Nodes - Sweep   On involved side, pump _4__ times from armpit along side of trunk to hip crease.  Now gently stretch skin from the involved side to the uninvolved side across the chest at the shoulder line.  Repeat that 4 times.  Draw an imaginary diagonal line from upper outer breast through the nipple area toward lower inner breast.  Direct fluid upward and inward from this line toward the pathway across your upper chest .  Do this in  three rows to treat all of the upper inner breast tissue, and do each row 3-4x.      Direct fluid to treat all of lower outer breast tissue downward and outward toward      pathway that is aimed at the left groin.  Finish by doing the pathways as described above going from your involved armpit to the same side groin and going across your upper chest from the involved shoulder to the uninvolved shoulder.  Repeat the steps above where you do circles in your left groin and right armpit. Copyright  VHI. All rights reserved.    Cancer Rehab [phone]

## 2023-04-29 ENCOUNTER — Other Ambulatory Visit: Payer: Self-pay | Admitting: Allergy

## 2023-04-29 ENCOUNTER — Other Ambulatory Visit: Payer: Self-pay | Admitting: Internal Medicine

## 2023-04-29 ENCOUNTER — Other Ambulatory Visit: Payer: Self-pay

## 2023-04-29 DIAGNOSIS — I890 Lymphedema, not elsewhere classified: Secondary | ICD-10-CM | POA: Diagnosis not present

## 2023-05-01 ENCOUNTER — Other Ambulatory Visit: Payer: Self-pay

## 2023-05-01 NOTE — Telephone Encounter (Signed)
Please disregard error

## 2023-05-09 DIAGNOSIS — H40023 Open angle with borderline findings, high risk, bilateral: Secondary | ICD-10-CM | POA: Diagnosis not present

## 2023-05-09 DIAGNOSIS — H1045 Other chronic allergic conjunctivitis: Secondary | ICD-10-CM | POA: Diagnosis not present

## 2023-05-09 DIAGNOSIS — H04123 Dry eye syndrome of bilateral lacrimal glands: Secondary | ICD-10-CM | POA: Diagnosis not present

## 2023-05-09 DIAGNOSIS — H524 Presbyopia: Secondary | ICD-10-CM | POA: Diagnosis not present

## 2023-05-09 DIAGNOSIS — H35363 Drusen (degenerative) of macula, bilateral: Secondary | ICD-10-CM | POA: Diagnosis not present

## 2023-05-10 ENCOUNTER — Telehealth: Payer: Self-pay | Admitting: Allergy

## 2023-05-10 DIAGNOSIS — H524 Presbyopia: Secondary | ICD-10-CM | POA: Diagnosis not present

## 2023-05-10 NOTE — Telephone Encounter (Signed)
Called patient's daughter, Vashti Hey - DOB/NEED Updated DPR - advised office does not carry Symbicort samples.   Vashti Hey was advised of need for updated DPR at patient's next office - stated she is POA - copy should be on file from [place].   I advised Taron I could not locate POA copy  - will f/u with front staff on Monday, 05/20/23 so patient's chart can be updated.  Taron verbalized understanding to all, no further questions.

## 2023-05-10 NOTE — Telephone Encounter (Signed)
Patient daughter called and stated is there samples she can pick up of the medication Symbicort SYMBICORT 80-4.5 MCG/ACT inhaler for her mother [phone].

## 2023-05-13 ENCOUNTER — Ambulatory Visit
Admission: RE | Admit: 2023-05-13 | Discharge: 2023-05-13 | Disposition: A | Discharge status: Home / Self Care | Payer: Medicare HMO | Source: Ambulatory Visit | Attending: Internal Medicine | Admitting: Internal Medicine

## 2023-05-13 DIAGNOSIS — Z1231 Encounter for screening mammogram for malignant neoplasm of breast: Secondary | ICD-10-CM | POA: Diagnosis not present

## 2023-05-16 ENCOUNTER — Ambulatory Visit: Payer: Medicare HMO | Admitting: Rehabilitation

## 2023-05-16 ENCOUNTER — Encounter: Payer: Self-pay | Admitting: Rehabilitation

## 2023-05-16 DIAGNOSIS — I890 Lymphedema, not elsewhere classified: Secondary | ICD-10-CM

## 2023-05-16 DIAGNOSIS — L599 Disorder of the skin and subcutaneous tissue related to radiation, unspecified: Secondary | ICD-10-CM | POA: Diagnosis not present

## 2023-05-16 NOTE — Therapy (Signed)
OUTPATIENT PHYSICAL THERAPY  UPPER EXTREMITY ONCOLOGY TREATMENT  Patient Name: Angelica [name] MRN: [id number] DOB:August 20, 1940, 82 y.o., female Today's Date: 05/16/2023  END OF SESSION:  PT End of Session - 05/16/23 1641     Visit Number 7    Number of Visits 9    Date for PT Re-Evaluation 06/06/23    PT Start Time 1550    PT Stop Time 1640    PT Time Calculation (min) 50 min    Activity Tolerance Patient tolerated treatment well    Behavior During Therapy [facility] for tasks assessed/performed               Past Medical History:  Diagnosis Date   Arthritis    Asthma    Bowel obstruction (HCC)    Cancer (HCC)    Environmental allergies    GERD (gastroesophageal reflux disease)    Glaucoma 05/09/2017   H. pylori infection    History of radiation therapy 07/05/20-08/05/20   Left lung IMRT Dr. Roselind Messier    HLD (hyperlipidemia) 01/16/2019   Hypertension    Iron deficiency anemia    Osteopenia 05/26/2017   Thyroid disease    Urticaria 07/25/2018   Past Surgical History:  Procedure Laterality Date   ABDOMINAL HYSTERECTOMY     BOWEL RESECTION N/A 05/23/2020   Procedure: SMALL BOWEL REPAIR;  Surgeon: Sheliah Hatch De Blanch, MD;  Location: [facility] OR;  Service: General;  Laterality: N/A;   BREAST BIOPSY     BRONCHIAL BIOPSY  06/10/2020   Procedure: BRONCHIAL BIOPSIES;  Surgeon: [name], DO;  Location: [facility] ENDOS[facility];  Service: Pulmonary;;   BRONCHIAL BRUSHINGS  06/10/2020   Procedure: BRONCHIAL BRUSHINGS;  Surgeon: [name], DO;  Location: [facility];  Service: Pulmonary;;   BRONCHIAL NEEDLE ASPIRATION BIOPSY  06/10/2020   Procedure: BRONCHIAL NEEDLE ASPIRATION BIOPSIES;  Surgeon: [name], DO;  Location: [facility];  Service: Pulmonary;;   BRONCHIAL WASHINGS  06/10/2020   Procedure: BRONCHIAL WASHINGS;  Surgeon: [name], DO;  Location: [facility];  Service: Pulmonary;;   COLON SURGERY     DIAGNOSTIC LARYNGOSCOPY N/A 05/23/2020   Procedure: ATTEMPTED  DIAGNOSTIC LAPAROSCOPY WITH ADHESIONS;  Surgeon: Sheliah Hatch, De Blanch, MD;  Location: [facility] OR;  Service: General;  Laterality: N/A;   IR IMAGING GUIDED PORT INSERTION  07/15/2020   IR REMOVAL TUN ACCESS W/ PORT W/O FL MOD SED  01/16/2023   KNEE SURGERY     LAPAROTOMY N/A 04/18/2015   Procedure: Exploratory laparotomy with lysis of adhesions, possible bowel resection;  Surgeon: Axel Filler, MD;  Location: [facility] OR;  Service: General;  Laterality: N/A;   LAPAROTOMY N/A 05/23/2020   Procedure: EXPLORATORY LAPAROTOMY;  Surgeon: Rodman Pickle, MD;  Location: [facility] OR;  Service: General;  Laterality: N/A;   LYSIS OF ADHESION N/A 05/23/2020   Procedure: LYSIS OF ADHESION;  Surgeon: Sheliah Hatch De Blanch, MD;  Location: [facility] OR;  Service: General;  Laterality: N/A;   NASAL SINUS SURGERY     THORACENTESIS Left 12/03/2022   Procedure: THORACENTESIS;  Surgeon: [name], DO;  Location: [facility];  Service: Pulmonary;  Laterality: Left;   THYROID SURGERY     VIDEO BRONCHOSCOPY WITH ENDOBRONCHIAL NAVIGATION Bilateral 06/10/2020   Procedure: VIDEO BRONCHOSCOPY WITH ENDOBRONCHIAL NAVIGATION;  Surgeon: [name], DO;  Location: [facility];  Service: Pulmonary;  Laterality: Bilateral;   VIDEO BRONCHOSCOPY WITH ENDOBRONCHIAL ULTRASOUND  06/10/2020   Procedure: VIDEO BRONCHOSCOPY WITH ENDOBRONCHIAL ULTRASOUND;  Surgeon: [name], DO;  Location: [facility];  Service: Pulmonary;;   Patient Active Problem List   Diagnosis Date Noted   Mastodynia of left breast 02/13/2023   Pleural effusion 11/21/2022   Swelling of breast 11/17/2022   Dyspnea 11/14/2022   Cellulitis of left breast 10/17/2022   Iron deficiency anemia 10/11/2022   Acute respiratory failure with hypoxia (HCC) 10/05/2022   Bilateral pleural effusion 10/05/2022   History of stroke 10/05/2022   Weakness 10/05/2022   Edema 10/05/2022   Alzheimer's disease (HCC) 09/06/2022   Ex-smoker 09/06/2022   Hypothyroidism 09/06/2022    Right leg pain 07/13/2021   Dementia (HCC) 07/13/2021   RLQ abdominal pain 07/13/2021   Encounter for well adult exam with abnormal findings 06/10/2021   Anosmia 05/01/2021   Chronic ethmoidal sinusitis 02/14/2021   Debility 02/01/2021   Gait disorder 02/01/2021   Malignant neoplasm of bronchus and lung (HCC)    Port-A-Cath in place 11/02/2020   Adenocarcinoma of left lung, stage 3 (HCC) 06/22/2020   Lung mass    Pulmonary nodule 05/17/2020   Iron deficiency 05/03/2020   Swelling of lower extremity 07/30/2019   Ganglion cyst of right foot 01/16/2019   HLD (hyperlipidemia) 01/16/2019   LPRD (laryngopharyngeal reflux disease) 07/25/2018   Constipation 08/19/2017   Asthma, severe persistent, well-controlled 06/04/2017   Chronic pansinusitis 06/04/2017   Nasal polyposis 06/04/2017   Osteopenia 05/26/2017   GERD (gastroesophageal reflux disease)    H. pylori infection    Other allergic rhinitis 03/20/2016   Malnutrition of moderate degree 04/11/2015   HTN (hypertension) 04/10/2015    PCP: Oliver Barre, MD  REFERRING PROVIDER: Harriette Bouillon, MD  REFERRING DIAG: I89.0 (ICD-10-CM) - Lymphedema, not elsewhere classified  THERAPY DIAG:  Lymphedema, not elsewhere classified  Disorder of the skin and subcutaneous tissue related to radiation, unspecified  ONSET DATE: pt unable to give a date - "it has been a while"  Rationale for Evaluation and Treatment: Rehabilitation  SUBJECTIVE:                                                                                                                                                                                           SUBJECTIVE STATEMENT:  Taron won't be able to do this because of her carpal tunnel.  Pt reports the breast doesn't bother her.  I try to wear the bra a lot.    PERTINENT HISTORY: history of non-small cell lung cancer with most recent CT 10/05/2022 demonstrating diffuse anasarca, as well as bilateral pleural  effusions, hx of radiation to chest,  hx of breast biopsy (benign), dementia and Alzheimer's   PAIN:  Are you having pain?  No  PRECAUTIONS: Other: chart states pt has Alzheimer's/dementia - pt unable to verify diagnosis in chart, no one else present with pt to assist  RED FLAGS: Unable to ask as pt has memory issues and no one present at appointment  WEIGHT BEARING RESTRICTIONS: No  FALLS:  Has patient fallen in last 6 months? No  LIVING ENVIRONMENT: Lives with: lives with their daughter Lives in: House/apartment Has following equipment at home: Dan Humphreys - 4 wheeled pt reports she does not use it  OCCUPATION: retired  LEISURE: pt reports she tries to walk  HAND DOMINANCE: right   PRIOR LEVEL OF FUNCTION: Independent does have memory issues  PATIENT GOALS: pt reports she does not know   OBJECTIVE: Note: Objective measures were completed at Evaluation unless otherwise noted.  COGNITION: Overall cognitive status: Within functional limits for tasks assessed   PALPATION: Fibrosis present in inferior L breast  OBSERVATIONS / OTHER ASSESSMENTS: L significantly larger than R breast, approximately double the size  POSTURE: forward head, rounded shoulders  UPPER EXTREMITY AROM/PROM: WFL  CERVICAL AROM: All within normal limits:    Percent limited  Flexion WFL  Extension 75% limited  Right lateral flexion WFL  Left lateral flexion 25% limited  Right rotation WFL  Left rotation WFL    LYMPHEDEMA ASSESSMENTS:   LANDMARK RIGHT  eval  At axilla  28.5  15 cm proximal to olecranon process 25.6  10 cm proximal to olecranon process 25.5  Olecranon process 22.5  15 cm proximal to ulnar styloid process 22.1  10 cm proximal to ulnar styloid process 18.5  Just proximal to ulnar styloid process 14.5  Across hand at thumb web space 18.8  At base of 2nd digit 6  (Blank rows = not tested)  LANDMARK LEFT  eval  At axilla  28.5  15 cm proximal to olecranon process 26.5   10 cm proximal to olecranon process 25.9  Olecranon process 24  15 cm proximal to ulnar styloid process 21.5  10 cm proximal to ulnar styloid process 18.1  Just proximal to ulnar styloid process 14.5  Across hand at thumb web space 18.2  At base of 2nd digit 5.6  (Blank rows = not tested)  LLIS:  Flowsheet Row Outpatient Rehab from 03/20/2023 in [facility]  Lymphedema Life Impact Scale Total Score 14.71 %      14.71  TODAY'S TREATMENT:                                                                                                                                          DATE:  05/16/23 In supine: Short neck, 5 diaphragmatic breaths, R axillary nodes and establishment of interaxillary pathway, L inguinal nodes and establishment of axilloinguinal pathway, then L breast moving fluid towards pathways spending extra time in any areas of fibrosis then retracing all steps. Daughter was present.  04/25/2023 Significant  fibrosis still noted at inferior medial breast, and thickening noted laterally but less so. In supine: Short neck, 5 diaphragmatic breaths, R axillary nodes and establishment of interaxillary pathway, L inguinal nodes and establishment of axilloinguinal pathway, then L breast moving fluid towards pathways spending extra time in any areas of fibrosis then retracing all steps. Pt educated in LN activation and medial breast but required frequent TC's/VC's. Daughter Dennie Bible was present and  has practiced 2x's previously while here, but has not done on her mom at home. She has no questions. She wants her sister Vashti Hey to practice again;appt set up for 05/16/23 at 4:00. She spoke with her sister while they were here and Taron agreed to appt on 12/26  04/11/23: In supine: Short neck, 5 diaphragmatic breaths, R axillary nodes and establishment of interaxillary pathway, L inguinal nodes and establishment of axilloinguinal pathway, then L breast moving fluid towards  pathways spending extra time in any areas of fibrosis then retracing all steps.   04/09/23: In supine: Short neck, 5 diaphragmatic breaths, R axillary nodes and establishment of interaxillary pathway, L inguinal nodes and establishment of axilloinguinal pathway, then L breast moving fluid towards pathways spending extra time in any areas of fibrosis then retracing all steps. Instructed pt and her daughter throughout and had pt's daughter return demonstrate each step while providing v/c and t/c for correct skin stretch technique and sequence. Issued handout. Educated pt and daughter that they can just use the interaxillary pathway if it is easier at home.   04/04/23: Manual Therapy MLD to Lt breast as follows: Short neck, superficial and deep abdominals (continued with instruction of belly breaths, not chest breathing), Lt inguinal nodes and Lt axillo-inguinal anastomosis, Rt axillary nodes, Rt anterior intact thorax, anterior inter-axillary anastomosis, then focused on superior and medial breast redirecting towards anterior anastomosis, then into Rt S/L for focus to lateral and inferior breast and lateral trunk where larger area of visible edema present but already some reduced from last session so focused here redirecting towards lateral anastomosis, then finished retracing all steps to anastomoses in supine  04/02/23: Self Care Began session with educating daughters and pt in basics of anatomy of lymphatic system and principles of MLD before beginning MLD. Also further discussed compression bra. Showed compression bras to pt and daughters and explained how this will also help decrease lymphedema. Daughter (who works at [employer]) had to call multiple times to get someone on the phone at [facility] [place] and finally got a Dec appt. Advised her of [facility] in [place] if that wasn't too far for them to drive to see if they could get her in sooner. Also discussed and showed swell spots and how pt can  benefit from this as well due to amount of fibrosis present in most of breast.  Manual Therapy MLD to Lt breast as follows: Short neck, 5 diaphragmatic breaths (took some time to instruct pt in belly breaths, not chest breathing), Lt inguinal nodes and Lt axillo-inguinal anastomosis, Rt axillary nodes, Rt anterior intact thorax, anterior inter-axillary anastomosis, then focused on superior and medial breast redirecting towards anterior anastomosis, then into Rt S/L for focus to lateral and inferior breast and lateral trunk where larger area of visible edema present so focused here redirecting towards lateral anastomosis, then finished retracing all steps in supine to anastomoses and began instructing daughter while performing in addition to instruction offered at beginning of session.   03/20/23: None today as pt was unaccompanied and has dementia, will begin  at next session when her daughter can be here    PATIENT EDUCATION:  Education details: lymphedema and how it is managed with manual lymphatic drainage and compression bra and swell spot or foam; began instruction of self MLD Person educated: Patient, daughter Education method: Explanation and Handouts, demonstration and verbal and tactile cues Education comprehension: verbalized understanding, will benefit from further review  HOME EXERCISE PROGRAM: Obtain compression bra  ASSESSMENT:  CLINICAL IMPRESSION: Pt is doing well.  Ultimately it is too hard for the family to do any self massage so pt will continue with compression and massage as she remembers.  She mentions a few times during treatment that she does it on herself.  Family knows she can return at any time.   OBJECTIVE IMPAIRMENTS: decreased knowledge of condition, decreased knowledge of use of DME, increased edema, and postural dysfunction.   ACTIVITY LIMITATIONS:  none  PARTICIPATION LIMITATIONS:  none  PERSONAL FACTORS: Time since onset of injury/illness/exacerbation and  1 comorbidity: dementia  are also affecting patient's functional outcome.   REHAB POTENTIAL: Good  CLINICAL DECISION MAKING: Stable/uncomplicated  EVALUATION COMPLEXITY: Low  GOALS: Goals reviewed with patient? Yes  SHORT TERM GOALS=LONG TERM GOALS Target date: 04/17/23  Pt will obtain a compression bra for long term management of L breast lymphedema.  Baseline: Goal status: MET 04/11/23   2.  Pt's daughters will be independent in breast MLD for long term management of lymphedema.  Baseline:  Goal status: MET/DEFERRED  3.  Pt will demonstrate a 50% improvement in fibrosis in L breast to decrease risk of infection.  Baseline:  Goal status: MET  PLAN:  PT FREQUENCY: 3 visits prn  PT DURATION: 6 weeks  PLANNED INTERVENTIONS: 97140- Manual therapy, [id number]- Orthotic Fit/training, Patient/Family education, Therapeutic exercises, Therapeutic activity, and Self Care  PLAN FOR NEXT SESSION: Cont MLD to L breast and instruct daughter Vashti Hey having her return demo, DC when Vashti Hey is Independent   Idamae Lusher, PT 05/16/2023, 4:48 PM  Self manual lymph drainage: Perform this sequence once a day.  Only give enough pressure to your skin to make the skin move.  Diaphragmatic - Supine   Inhale through nose making navel move out toward hands. Exhale through puckered lips, hands follow navel in. Repeat _5__ times. Rest _10__ seconds between repeats.   Copyright  VHI. All rights reserved.  Hug yourself.  Do circles at your neck just above your collarbones.  Repeat this 10 times.  Axilla - One at a Time   Using full weight of flat hand and fingers at center of uninvolved armpit, make _10__ in-place circles.   Copyright  VHI. All rights reserved.  LEG: Inguinal Nodes Stimulation   With small finger side of hand against hip crease on involved side, gently perform circles at the crease. Repeat __10_ times.   Copyright  VHI. All rights reserved.  Axilla to Inguinal Nodes -  Sweep   On involved side, pump _4__ times from armpit along side of trunk to hip crease.  Now gently stretch skin from the involved side to the uninvolved side across the chest at the shoulder line.  Repeat that 4 times.  Draw an imaginary diagonal line from upper outer breast through the nipple area toward lower inner breast.  Direct fluid upward and inward from this line toward the pathway across your upper chest .  Do this in three rows to treat all of the upper inner breast tissue, and do each row 3-4x.  Direct fluid to treat all of lower outer breast tissue downward and outward toward      pathway that is aimed at the left groin.  Finish by doing the pathways as described above going from your involved armpit to the same side groin and going across your upper chest from the involved shoulder to the uninvolved shoulder.  Repeat the steps above where you do circles in your left groin and right armpit. Copyright  VHI. All rights reserved.    Cancer Rehab [phone]  PHYSICAL THERAPY DISCHARGE SUMMARY  Visits from Start of Care: 7  Current functional level related to goals / functional outcomes: Goals met   Remaining deficits: Breast lymphedema    Education / Equipment: Family and pt educated on edema management  Plan: Patient agrees to discharge. Patient is being discharged due to meeting the stated rehab goals.

## 2023-05-20 ENCOUNTER — Other Ambulatory Visit: Payer: Self-pay | Admitting: Internal Medicine

## 2023-05-20 DIAGNOSIS — R928 Other abnormal and inconclusive findings on diagnostic imaging of breast: Secondary | ICD-10-CM

## 2023-05-24 ENCOUNTER — Encounter: Payer: Self-pay | Admitting: Physician Assistant

## 2023-05-29 ENCOUNTER — Ambulatory Visit: Payer: Medicare HMO

## 2023-05-29 DIAGNOSIS — J4550 Severe persistent asthma, uncomplicated: Secondary | ICD-10-CM

## 2023-05-30 ENCOUNTER — Encounter: Payer: Self-pay | Admitting: Physician Assistant

## 2023-05-30 ENCOUNTER — Encounter: Payer: Self-pay | Admitting: Rehabilitation

## 2023-05-30 ENCOUNTER — Ambulatory Visit: Payer: Medicare Other | Attending: Surgery | Admitting: Rehabilitation

## 2023-05-30 DIAGNOSIS — L599 Disorder of the skin and subcutaneous tissue related to radiation, unspecified: Secondary | ICD-10-CM | POA: Diagnosis present

## 2023-05-30 DIAGNOSIS — I890 Lymphedema, not elsewhere classified: Secondary | ICD-10-CM | POA: Insufficient documentation

## 2023-05-30 NOTE — Therapy (Signed)
 OUTPATIENT PHYSICAL THERAPY  UPPER EXTREMITY ONCOLOGY TREATMENT  Patient Name: Angelica [name] MRN: [id number] DOB:03-05-1941, 83 y.o., female Today's Date: 05/30/2023  END OF SESSION:  PT End of Session - 05/30/23 1154     Visit Number 9    Number of Visits 13    Date for PT Re-Evaluation 06/27/23    Authorization Type needs auth    PT Start Time 1107    PT Stop Time 1157    PT Time Calculation (min) 50 min    Activity Tolerance Patient tolerated treatment well    Behavior During Therapy [id number] for tasks assessed/performed                Past Medical History:  Diagnosis Date   Arthritis    Asthma    Bowel obstruction (HCC)    Cancer (HCC)    Environmental allergies    GERD (gastroesophageal reflux disease)    Glaucoma 05/09/2017   H. pylori infection    History of radiation therapy 07/05/20-08/05/20   Left lung IMRT Dr. Shannon    HLD (hyperlipidemia) 01/16/2019   Hypertension    Iron  deficiency anemia    Osteopenia 05/26/2017   Thyroid  disease    Urticaria 07/25/2018   Past Surgical History:  Procedure Laterality Date   ABDOMINAL HYSTERECTOMY     BOWEL RESECTION N/A 05/23/2020   Procedure: SMALL BOWEL REPAIR;  Surgeon: Stevie, Herlene Righter, MD;  Location: [facility] OR;  Service: General;  Laterality: N/A;   BREAST BIOPSY     BRONCHIAL BIOPSY  06/10/2020   Procedure: BRONCHIAL BIOPSIES;  Surgeon: Brenna Adine CROME, DO;  Location: [facility];  Service: Pulmonary;;   BRONCHIAL BRUSHINGS  06/10/2020   Procedure: BRONCHIAL BRUSHINGS;  Surgeon: Brenna Adine CROME, DO;  Location: [facility];  Service: Pulmonary;;   BRONCHIAL NEEDLE ASPIRATION BIOPSY  06/10/2020   Procedure: BRONCHIAL NEEDLE ASPIRATION BIOPSIES;  Surgeon: Brenna Adine CROME, DO;  Location: [facility];  Service: Pulmonary;;   BRONCHIAL WASHINGS  06/10/2020   Procedure: BRONCHIAL WASHINGS;  Surgeon: Brenna Adine CROME, DO;  Location: [facility];  Service: Pulmonary;;   COLON SURGERY     DIAGNOSTIC LARYNGOSCOPY N/A  05/23/2020   Procedure: ATTEMPTED DIAGNOSTIC LAPAROSCOPY WITH ADHESIONS;  Surgeon: Stevie, Herlene Righter, MD;  Location: [facility] OR;  Service: General;  Laterality: N/A;   IR IMAGING GUIDED PORT INSERTION  07/15/2020   IR REMOVAL TUN ACCESS W/ PORT W/O FL MOD SED  01/16/2023   KNEE SURGERY     LAPAROTOMY N/A 04/18/2015   Procedure: Exploratory laparotomy with lysis of adhesions, possible bowel resection;  Surgeon: Lynda Leos, MD;  Location: [facility];  Service: General;  Laterality: N/A;   LAPAROTOMY N/A 05/23/2020   Procedure: EXPLORATORY LAPAROTOMY;  Surgeon: Stevie Herlene Righter, MD;  Location: [facility];  Service: General;  Laterality: N/A;   LYSIS OF ADHESION N/A 05/23/2020   Procedure: LYSIS OF ADHESION;  Surgeon: Stevie Herlene Righter, MD;  Location: [facility] OR;  Service: General;  Laterality: N/A;   NASAL SINUS SURGERY     THORACENTESIS Left 12/03/2022   Procedure: THORACENTESIS;  Surgeon: Brenna Adine CROME, DO;  Location: [facility];  Service: Pulmonary;  Laterality: Left;   THYROID  SURGERY     VIDEO BRONCHOSCOPY WITH ENDOBRONCHIAL NAVIGATION Bilateral 06/10/2020   Procedure: VIDEO BRONCHOSCOPY WITH ENDOBRONCHIAL NAVIGATION;  Surgeon: Brenna Adine CROME, DO;  Location: [facility];  Service: Pulmonary;  Laterality: Bilateral;   VIDEO BRONCHOSCOPY WITH ENDOBRONCHIAL ULTRASOUND  06/10/2020   Procedure: VIDEO BRONCHOSCOPY WITH ENDOBRONCHIAL  ULTRASOUND;  Surgeon: Brenna Adine CROME, DO;  Location: [facility];  Service: Pulmonary;;   Patient Active Problem List   Diagnosis Date Noted   Mastodynia of left breast 02/13/2023   Pleural effusion 11/21/2022   Swelling of breast 11/17/2022   Dyspnea 11/14/2022   Cellulitis of left breast 10/17/2022   Iron  deficiency anemia 10/11/2022   Acute respiratory failure with hypoxia (HCC) 10/05/2022   Bilateral pleural effusion 10/05/2022   History of stroke 10/05/2022   Weakness 10/05/2022   Edema 10/05/2022   Alzheimer's disease (HCC) 09/06/2022   Ex-smoker  09/06/2022   Hypothyroidism 09/06/2022   Right leg pain 07/13/2021   Dementia (HCC) 07/13/2021   RLQ abdominal pain 07/13/2021   Encounter for well adult exam with abnormal findings 06/10/2021   Anosmia 05/01/2021   Chronic ethmoidal sinusitis 02/14/2021   Debility 02/01/2021   Gait disorder 02/01/2021   Malignant neoplasm of bronchus and lung (HCC)    Port-A-Cath in place 11/02/2020   Adenocarcinoma of left lung, stage 3 (HCC) 06/22/2020   Lung mass    Pulmonary nodule 05/17/2020   Iron  deficiency 05/03/2020   Swelling of lower extremity 07/30/2019   Ganglion cyst of right foot 01/16/2019   HLD (hyperlipidemia) 01/16/2019   LPRD (laryngopharyngeal reflux disease) 07/25/2018   Constipation 08/19/2017   Asthma, severe persistent, well-controlled 06/04/2017   Chronic pansinusitis 06/04/2017   Nasal polyposis 06/04/2017   Osteopenia 05/26/2017   GERD (gastroesophageal reflux disease)    H. pylori infection    Other allergic rhinitis 03/20/2016   Malnutrition of moderate degree 04/11/2015   HTN (hypertension) 04/10/2015    PCP: Lynwood Rush, MD  REFERRING PROVIDER: Vanderbilt Ned, MD  REFERRING DIAG: I89.0 (ICD-10-CM) - Lymphedema, not elsewhere classified  THERAPY DIAG:  Lymphedema, not elsewhere classified  Disorder of the skin and subcutaneous tissue related to radiation, unspecified  ONSET DATE: pt unable to give a date - it has been a while  Rationale for Evaluation and Treatment: Rehabilitation  SUBJECTIVE:                                                                                                                                                                                           SUBJECTIVE STATEMENT:   I'm not in any pain or anything.   Per phone call with other daughter - she reports that Dr. Vanderbilt wants her to do more visits but pt and sister that are here do not know anything about this.   PERTINENT HISTORY: history of non-small cell  lung cancer with most recent CT 10/05/2022 demonstrating diffuse anasarca, as well as bilateral pleural effusions, hx of radiation  to chest,  hx of breast biopsy (benign), dementia and Alzheimer's   PAIN:  Are you having pain? No  PRECAUTIONS: Other: chart states pt has Alzheimer's/dementia - pt unable to verify diagnosis in chart, no one else present with pt to assist  RED FLAGS: Unable to ask as pt has memory issues and no one present at appointment  WEIGHT BEARING RESTRICTIONS: No  FALLS:  Has patient fallen in last 6 months? No  LIVING ENVIRONMENT: Lives with: lives with their daughter Lives in: House/apartment Has following equipment at home: Vannie - 4 wheeled pt reports she does not use it  OCCUPATION: retired  LEISURE: pt reports she tries to walk  HAND DOMINANCE: right   PRIOR LEVEL OF FUNCTION: Independent does have memory issues  PATIENT GOALS: pt reports she does not know   OBJECTIVE: Note: Objective measures were completed at Evaluation unless otherwise noted.  COGNITION: Overall cognitive status: Within functional limits for tasks assessed   PALPATION: Fibrosis present in inferior L breast  OBSERVATIONS / OTHER ASSESSMENTS: L significantly larger than R breast, approximately double the size  POSTURE: forward head, rounded shoulders  UPPER EXTREMITY AROM/PROM: WFL  CERVICAL AROM: All within normal limits:    Percent limited  Flexion WFL  Extension 75% limited  Right lateral flexion WFL  Left lateral flexion 25% limited  Right rotation WFL  Left rotation WFL    LYMPHEDEMA ASSESSMENTS:   LANDMARK RIGHT  eval  At axilla  28.5  15 cm proximal to olecranon process 25.6  10 cm proximal to olecranon process 25.5  Olecranon process 22.5  15 cm proximal to ulnar styloid process 22.1  10 cm proximal to ulnar styloid process 18.5  Just proximal to ulnar styloid process 14.5  Across hand at thumb web space 18.8  At base of 2nd digit 6  (Blank  rows = not tested)  LANDMARK LEFT  eval  At axilla  28.5  15 cm proximal to olecranon process 26.5  10 cm proximal to olecranon process 25.9  Olecranon process 24  15 cm proximal to ulnar styloid process 21.5  10 cm proximal to ulnar styloid process 18.1  Just proximal to ulnar styloid process 14.5  Across hand at thumb web space 18.2  At base of 2nd digit 5.6  (Blank rows = not tested)  LLIS:  Flowsheet Row Outpatient Rehab from [facility]/20/2023 in [facility]  Lymphedema Life Impact Scale Total Score 14.71 %      14.71   BREAST COMPLAINTS QUESTIONNAIRE Pain:  0 Heaviness: 5 Swollen feeling:0 Tense Skin: 0 Redness: 0 Bra Print: 0 Size of Pores: 0 Hard feeling:  3 Total:    8 /80 A Score over 9 indicates lymphedema issues in the breast   TODAY'S TREATMENT:  DATE:  05/30/23 In supine: Short neck, 5 diaphragmatic breaths, bil axillary nodes and establishment of interaxillary pathway, L inguinal nodes and establishment of axilloinguinal pathway, then L breast moving fluid towards pathways spending extra time in any areas of fibrosis then retracing all steps. Daughter was present.  05/16/23 In supine: Short neck, 5 diaphragmatic breaths, R axillary nodes and establishment of interaxillary pathway, L inguinal nodes and establishment of axilloinguinal pathway, then L breast moving fluid towards pathways spending extra time in any areas of fibrosis then retracing all steps. Daughter was present.  04/25/2023 Significant fibrosis still noted at inferior medial breast, and thickening noted laterally but less so. In supine: Short neck, 5 diaphragmatic breaths, R axillary nodes and establishment of interaxillary pathway, L inguinal nodes and establishment of axilloinguinal pathway, then L breast moving fluid towards pathways  spending extra time in any areas of fibrosis then retracing all steps. Pt educated in LN activation and medial breast but required frequent TC's/VC's. Daughter Bruna was present and  has practiced 2x's previously while here, but has not done on her mom at home. She has no questions. She wants her sister Larry to practice again;appt set up for 05/16/23 at 4:00. She spoke with her sister while they were here and Taron agreed to appt on 12/26  04/11/23: In supine: Short neck, 5 diaphragmatic breaths, R axillary nodes and establishment of interaxillary pathway, L inguinal nodes and establishment of axilloinguinal pathway, then L breast moving fluid towards pathways spending extra time in any areas of fibrosis then retracing all steps.   04/09/23: In supine: Short neck, 5 diaphragmatic breaths, R axillary nodes and establishment of interaxillary pathway, L inguinal nodes and establishment of axilloinguinal pathway, then L breast moving fluid towards pathways spending extra time in any areas of fibrosis then retracing all steps. Instructed pt and her daughter throughout and had pt's daughter return demonstrate each step while providing v/c and t/c for correct skin stretch technique and sequence. Issued handout. Educated pt and daughter that they can just use the interaxillary pathway if it is easier at home.   04/04/23: Manual Therapy MLD to Lt breast as follows: Short neck, superficial and deep abdominals (continued with instruction of belly breaths, not chest breathing), Lt inguinal nodes and Lt axillo-inguinal anastomosis, Rt axillary nodes, Rt anterior intact thorax, anterior inter-axillary anastomosis, then focused on superior and medial breast redirecting towards anterior anastomosis, then into Rt S/L for focus to lateral and inferior breast and lateral trunk where larger area of visible edema present but already some reduced from last session so focused here redirecting towards lateral anastomosis, then  finished retracing all steps to anastomoses in supine  04/02/23: Self Care Began session with educating daughters and pt in basics of anatomy of lymphatic system and principles of MLD before beginning MLD. Also further discussed compression bra. Showed compression bras to pt and daughters and explained how this will also help decrease lymphedema. Daughter (who works at [employer]) had to call multiple times to get someone on the phone at [facility] [place] and finally got a Dec appt. Advised her of [facility] in [place] if that wasn't too far for them to drive to see if they could get her in sooner. Also discussed and showed swell spots and how pt can benefit from this as well due to amount of fibrosis present in most of breast.  Manual Therapy MLD to Lt breast as follows: Short neck, 5 diaphragmatic breaths (took some time to instruct pt in belly  breaths, not chest breathing), Lt inguinal nodes and Lt axillo-inguinal anastomosis, Rt axillary nodes, Rt anterior intact thorax, anterior inter-axillary anastomosis, then focused on superior and medial breast redirecting towards anterior anastomosis, then into Rt S/L for focus to lateral and inferior breast and lateral trunk where larger area of visible edema present so focused here redirecting towards lateral anastomosis, then finished retracing all steps in supine to anastomoses and began instructing daughter while performing in addition to instruction offered at beginning of session.   03/20/23: None today as pt was unaccompanied and has dementia, will begin at next session when her daughter can be here    PATIENT EDUCATION:  Education details: lymphedema and how it is managed with manual lymphatic drainage and compression bra and swell spot or foam; began instruction of self MLD Person educated: Patient, daughter Education method: Explanation and Handouts, demonstration and verbal and tactile cues Education comprehension: verbalized understanding, will  benefit from further review  HOME EXERCISE PROGRAM: Obtain compression bra  ASSESSMENT:  CLINICAL IMPRESSION: Pt was D/C'd after last visit with daughter that was here with her today, but her other daughter apparently called and scheduled more visits after a phone call with Dr. Vanderbilt.  Pt is not aware due to dementia. Restarted visits today. Pt is doing well.  Ultimately it is too hard for the family to do any self massage so pt will continue with compression and massage here for a few more visits.  Her recent mammogram showed that she has skin thickening and signs of breast lymphedema. Pt is only scheduled for 1 more but I extended more visits in case they do more.    OBJECTIVE IMPAIRMENTS: decreased knowledge of condition, decreased knowledge of use of DME, increased edema, and postural dysfunction.   ACTIVITY LIMITATIONS:  none  PARTICIPATION LIMITATIONS:  none  PERSONAL FACTORS: Time since onset of injury/illness/exacerbation and 1 comorbidity: dementia  are also affecting patient's functional outcome.   REHAB POTENTIAL: Good  CLINICAL DECISION MAKING: Stable/uncomplicated  EVALUATION COMPLEXITY: Low  GOALS: Goals reviewed with patient? Yes  SHORT TERM GOALS=LONG TERM GOALS Target date: 04/17/23  Pt will obtain a compression bra for long term management of L breast lymphedema.  Baseline: Goal status: MET 04/11/23   2.  Pt's daughters will be independent in breast MLD for long term management of lymphedema.  Baseline:  Goal status: MET/DEFERRED  3.  Pt will demonstrate a 50% improvement in fibrosis in L breast to decrease risk of infection.  Baseline:  Goal status: ONGOING  PLAN:  PT FREQUENCY: 1 visit per week   PT DURATION: 4 weeks   PLANNED INTERVENTIONS: 97140- Manual therapy, [id number]- Orthotic Fit/training, Patient/Family education, Therapeutic exercises, Therapeutic activity, and Self Care  PLAN FOR NEXT SESSION: Cont MLD to L breast - daughters will not do  at home.   Kashawna Manzer R, PT 05/30/2023, 11:57 AM  Self manual lymph drainage: Perform this sequence once a day.  Only give enough pressure to your skin to make the skin move.  Diaphragmatic - Supine   Inhale through nose making navel move out toward hands. Exhale through puckered lips, hands follow navel in. Repeat _5__ times. Rest _10__ seconds between repeats.   Copyright  VHI. All rights reserved.  Hug yourself.  Do circles at your neck just above your collarbones.  Repeat this 10 times.  Axilla - One at a Time   Using full weight of flat hand and fingers at center of uninvolved armpit, make _10__ in-place circles.  Copyright  VHI. All rights reserved.  LEG: Inguinal Nodes Stimulation   With small finger side of hand against hip crease on involved side, gently perform circles at the crease. Repeat __10_ times.   Copyright  VHI. All rights reserved.  Axilla to Inguinal Nodes - Sweep   On involved side, pump _4__ times from armpit along side of trunk to hip crease.  Now gently stretch skin from the involved side to the uninvolved side across the chest at the shoulder line.  Repeat that 4 times.  Draw an imaginary diagonal line from upper outer breast through the nipple area toward lower inner breast.  Direct fluid upward and inward from this line toward the pathway across your upper chest .  Do this in three rows to treat all of the upper inner breast tissue, and do each row 3-4x.      Direct fluid to treat all of lower outer breast tissue downward and outward toward      pathway that is aimed at the left groin.  Finish by doing the pathways as described above going from your involved armpit to the same side groin and going across your upper chest from the involved shoulder to the uninvolved shoulder.  Repeat the steps above where you do circles in your left groin and right armpit. Copyright  VHI. All rights reserved.    Cancer Rehab [phone]

## 2023-06-06 ENCOUNTER — Ambulatory Visit
Admission: RE | Admit: 2023-06-06 | Discharge: 2023-06-06 | Disposition: A | Discharge status: Home / Self Care | Payer: Medicare Other | Source: Ambulatory Visit | Attending: Internal Medicine | Admitting: Internal Medicine

## 2023-06-06 ENCOUNTER — Ambulatory Visit: Payer: Medicare Other | Admitting: Physical Therapy

## 2023-06-06 ENCOUNTER — Encounter: Payer: Self-pay | Admitting: Physical Therapy

## 2023-06-06 ENCOUNTER — Ambulatory Visit: Payer: Medicare HMO

## 2023-06-06 DIAGNOSIS — I890 Lymphedema, not elsewhere classified: Secondary | ICD-10-CM | POA: Diagnosis not present

## 2023-06-06 DIAGNOSIS — L599 Disorder of the skin and subcutaneous tissue related to radiation, unspecified: Secondary | ICD-10-CM

## 2023-06-06 DIAGNOSIS — R928 Other abnormal and inconclusive findings on diagnostic imaging of breast: Secondary | ICD-10-CM

## 2023-06-06 NOTE — Therapy (Signed)
OUTPATIENT PHYSICAL THERAPY  UPPER EXTREMITY ONCOLOGY TREATMENT  Progress Note Reporting Period 03/20/23 to 06/06/23   See note below for Objective Data and Assessment of Progress/Goals.     Patient Name: Angelica [name] MRN: [id number] DOB:February 26, 1941, 83 y.o., female Today's Date: 06/06/2023  END OF SESSION:  PT End of Session - 06/06/23 1404     Visit Number 10    Number of Visits 13    Date for PT Re-Evaluation 06/27/23    PT Start Time 1404    PT Stop Time 1456    PT Time Calculation (min) 52 min    Activity Tolerance Patient tolerated treatment well    Behavior During Therapy [facility] for tasks assessed/performed                Past Medical History:  Diagnosis Date   Arthritis    Asthma    Bowel obstruction (HCC)    Cancer (HCC)    Environmental allergies    GERD (gastroesophageal reflux disease)    Glaucoma 05/09/2017   H. pylori infection    History of radiation therapy 07/05/20-08/05/20   Left lung IMRT Dr. Roselind Messier    HLD (hyperlipidemia) 01/16/2019   Hypertension    Iron deficiency anemia    Osteopenia 05/26/2017   Thyroid disease    Urticaria 07/25/2018   Past Surgical History:  Procedure Laterality Date   ABDOMINAL HYSTERECTOMY     BOWEL RESECTION N/A 05/23/2020   Procedure: SMALL BOWEL REPAIR;  Surgeon: Sheliah Hatch De Blanch, MD;  Location: [facility] OR;  Service: General;  Laterality: N/A;   BREAST BIOPSY     BRONCHIAL BIOPSY  06/10/2020   Procedure: BRONCHIAL BIOPSIES;  Surgeon: [name], DO;  Location: [facility] ENDOS[facility];  Service: Pulmonary;;   BRONCHIAL BRUSHINGS  06/10/2020   Procedure: BRONCHIAL BRUSHINGS;  Surgeon: [name], DO;  Location: [facility];  Service: Pulmonary;;   BRONCHIAL NEEDLE ASPIRATION BIOPSY  06/10/2020   Procedure: BRONCHIAL NEEDLE ASPIRATION BIOPSIES;  Surgeon: [name], DO;  Location: [facility];  Service: Pulmonary;;   BRONCHIAL WASHINGS  06/10/2020   Procedure: BRONCHIAL WASHINGS;  Surgeon: [name],  DO;  Location: [facility];  Service: Pulmonary;;   COLON SURGERY     DIAGNOSTIC LARYNGOSCOPY N/A 05/23/2020   Procedure: ATTEMPTED DIAGNOSTIC LAPAROSCOPY WITH ADHESIONS;  Surgeon: Sheliah Hatch, De Blanch, MD;  Location: [facility] OR;  Service: General;  Laterality: N/A;   IR IMAGING GUIDED PORT INSERTION  07/15/2020   IR REMOVAL TUN ACCESS W/ PORT W/O FL MOD SED  01/16/2023   KNEE SURGERY     LAPAROTOMY N/A 04/18/2015   Procedure: Exploratory laparotomy with lysis of adhesions, possible bowel resection;  Surgeon: Axel Filler, MD;  Location: [facility];  Service: General;  Laterality: N/A;   LAPAROTOMY N/A 05/23/2020   Procedure: EXPLORATORY LAPAROTOMY;  Surgeon: Rodman Pickle, MD;  Location: [facility] OR;  Service: General;  Laterality: N/A;   LYSIS OF ADHESION N/A 05/23/2020   Procedure: LYSIS OF ADHESION;  Surgeon: Sheliah Hatch De Blanch, MD;  Location: [facility] OR;  Service: General;  Laterality: N/A;   NASAL SINUS SURGERY     THORACENTESIS Left 12/03/2022   Procedure: THORACENTESIS;  Surgeon: [name], DO;  Location: [facility];  Service: Pulmonary;  Laterality: Left;   THYROID SURGERY     VIDEO BRONCHOSCOPY WITH ENDOBRONCHIAL NAVIGATION Bilateral 06/10/2020   Procedure: VIDEO BRONCHOSCOPY WITH ENDOBRONCHIAL NAVIGATION;  Surgeon: [name], DO;  Location: [facility];  Service: Pulmonary;  Laterality: Bilateral;  VIDEO BRONCHOSCOPY WITH ENDOBRONCHIAL ULTRASOUND  06/10/2020   Procedure: VIDEO BRONCHOSCOPY WITH ENDOBRONCHIAL ULTRASOUND;  Surgeon: [name], DO;  Location: [facility];  Service: Pulmonary;;   Patient Active Problem List   Diagnosis Date Noted   Mastodynia of left breast 02/13/2023   Pleural effusion 11/21/2022   Swelling of breast 11/17/2022   Dyspnea 11/14/2022   Cellulitis of left breast 10/17/2022   Iron deficiency anemia 10/11/2022   Acute respiratory failure with hypoxia (HCC) 10/05/2022   Bilateral pleural effusion 10/05/2022   History of stroke 10/05/2022    Weakness 10/05/2022   Edema 10/05/2022   Alzheimer's disease (HCC) 09/06/2022   Ex-smoker 09/06/2022   Hypothyroidism 09/06/2022   Right leg pain 07/13/2021   Dementia (HCC) 07/13/2021   RLQ abdominal pain 07/13/2021   Encounter for well adult exam with abnormal findings 06/10/2021   Anosmia 05/01/2021   Chronic ethmoidal sinusitis 02/14/2021   Debility 02/01/2021   Gait disorder 02/01/2021   Malignant neoplasm of bronchus and lung (HCC)    Port-A-Cath in place 11/02/2020   Adenocarcinoma of left lung, stage 3 (HCC) 06/22/2020   Lung mass    Pulmonary nodule 05/17/2020   Iron deficiency 05/03/2020   Swelling of lower extremity 07/30/2019   Ganglion cyst of right foot 01/16/2019   HLD (hyperlipidemia) 01/16/2019   LPRD (laryngopharyngeal reflux disease) 07/25/2018   Constipation 08/19/2017   Asthma, severe persistent, well-controlled 06/04/2017   Chronic pansinusitis 06/04/2017   Nasal polyposis 06/04/2017   Osteopenia 05/26/2017   GERD (gastroesophageal reflux disease)    H. pylori infection    Other allergic rhinitis 03/20/2016   Malnutrition of moderate degree 04/11/2015   HTN (hypertension) 04/10/2015    PCP: Oliver Barre, MD  REFERRING PROVIDER: Harriette Bouillon, MD  REFERRING DIAG: I89.0 (ICD-10-CM) - Lymphedema, not elsewhere classified  THERAPY DIAG:  Lymphedema, not elsewhere classified  Disorder of the skin and subcutaneous tissue related to radiation, unspecified  ONSET DATE: pt unable to give a date - "it has been a while"  Rationale for Evaluation and Treatment: Rehabilitation  SUBJECTIVE:                                                                                                                                                                                           SUBJECTIVE STATEMENT:  My swelling is getting better.   PERTINENT HISTORY: history of non-small cell lung cancer with most recent CT 10/05/2022 demonstrating diffuse anasarca, as well  as bilateral pleural effusions, hx of radiation to chest,  hx of breast biopsy (benign), dementia and Alzheimer's   PAIN:  Are you having pain? Yes knee hurts,  unable to rate, reports it is from arthritis and worse when walking  PRECAUTIONS: Other: chart states pt has Alzheimer's/dementia - pt unable to verify diagnosis in chart, no one else present with pt to assist  RED FLAGS: Unable to ask as pt has memory issues and no one present at appointment  WEIGHT BEARING RESTRICTIONS: No  FALLS:  Has patient fallen in last 6 months? No  LIVING ENVIRONMENT: Lives with: lives with their daughter Lives in: House/apartment Has following equipment at home: Dan Humphreys - 4 wheeled pt reports she does not use it  OCCUPATION: retired  LEISURE: pt reports she tries to walk  HAND DOMINANCE: right   PRIOR LEVEL OF FUNCTION: Independent does have memory issues  PATIENT GOALS: pt reports she does not know   OBJECTIVE: Note: Objective measures were completed at Evaluation unless otherwise noted.  COGNITION: Overall cognitive status: Within functional limits for tasks assessed   PALPATION: Fibrosis present in inferior L breast  OBSERVATIONS / OTHER ASSESSMENTS: L significantly larger than R breast, approximately double the size  POSTURE: forward head, rounded shoulders  UPPER EXTREMITY AROM/PROM: WFL  CERVICAL AROM: All within normal limits:    Percent limited  Flexion WFL  Extension 75% limited  Right lateral flexion WFL  Left lateral flexion 25% limited  Right rotation WFL  Left rotation WFL    LYMPHEDEMA ASSESSMENTS:   LANDMARK RIGHT  eval  At axilla  28.5  15 cm proximal to olecranon process 25.6  10 cm proximal to olecranon process 25.5  Olecranon process 22.5  15 cm proximal to ulnar styloid process 22.1  10 cm proximal to ulnar styloid process 18.5  Just proximal to ulnar styloid process 14.5  Across hand at thumb web space 18.8  At base of 2nd digit 6  (Blank rows  = not tested)  LANDMARK LEFT  eval  At axilla  28.5  15 cm proximal to olecranon process 26.5  10 cm proximal to olecranon process 25.9  Olecranon process 24  15 cm proximal to ulnar styloid process 21.5  10 cm proximal to ulnar styloid process 18.1  Just proximal to ulnar styloid process 14.5  Across hand at thumb web space 18.2  At base of 2nd digit 5.6  (Blank rows = not tested)  LLIS:  Flowsheet Row Outpatient Rehab from 03/20/2023 in [facility]  Lymphedema Life Impact Scale Total Score 14.71 %      14.71   BREAST COMPLAINTS QUESTIONNAIRE Pain:  0 Heaviness: 5 Swollen feeling:0 Tense Skin: 0 Redness: 0 Bra Print: 0 Size of Pores: 0 Hard feeling:  3 Total:    8 /80 A Score over 9 indicates lymphedema issues in the breast   TODAY'S TREATMENT:  DATE:  06/06/23 In supine: Short neck, 5 diaphragmatic breaths, bil axillary nodes and establishment of interaxillary pathway, L inguinal nodes and establishment of axilloinguinal pathway, then L breast moving fluid towards pathways spending extra time in any areas of fibrosis then retracing all steps. Daughter was present.  05/30/23 In supine: Short neck, 5 diaphragmatic breaths, bil axillary nodes and establishment of interaxillary pathway, L inguinal nodes and establishment of axilloinguinal pathway, then L breast moving fluid towards pathways spending extra time in any areas of fibrosis then retracing all steps. Daughter was present.  05/16/23 In supine: Short neck, 5 diaphragmatic breaths, R axillary nodes and establishment of interaxillary pathway, L inguinal nodes and establishment of axilloinguinal pathway, then L breast moving fluid towards pathways spending extra time in any areas of fibrosis then retracing all steps. Daughter was  present.  04/25/2023 Significant fibrosis still noted at inferior medial breast, and thickening noted laterally but less so. In supine: Short neck, 5 diaphragmatic breaths, R axillary nodes and establishment of interaxillary pathway, L inguinal nodes and establishment of axilloinguinal pathway, then L breast moving fluid towards pathways spending extra time in any areas of fibrosis then retracing all steps. Pt educated in LN activation and medial breast but required frequent TC's/VC's. Daughter Dennie Bible was present and  has practiced 2x's previously while here, but has not done on her mom at home. She has no questions. She wants her sister Vashti Hey to practice again;appt set up for 05/16/23 at 4:00. She spoke with her sister while they were here and Taron agreed to appt on 12/26  04/11/23: In supine: Short neck, 5 diaphragmatic breaths, R axillary nodes and establishment of interaxillary pathway, L inguinal nodes and establishment of axilloinguinal pathway, then L breast moving fluid towards pathways spending extra time in any areas of fibrosis then retracing all steps.   04/09/23: In supine: Short neck, 5 diaphragmatic breaths, R axillary nodes and establishment of interaxillary pathway, L inguinal nodes and establishment of axilloinguinal pathway, then L breast moving fluid towards pathways spending extra time in any areas of fibrosis then retracing all steps. Instructed pt and her daughter throughout and had pt's daughter return demonstrate each step while providing v/c and t/c for correct skin stretch technique and sequence. Issued handout. Educated pt and daughter that they can just use the interaxillary pathway if it is easier at home.   04/04/23: Manual Therapy MLD to Lt breast as follows: Short neck, superficial and deep abdominals (continued with instruction of belly breaths, not chest breathing), Lt inguinal nodes and Lt axillo-inguinal anastomosis, Rt axillary nodes, Rt anterior intact thorax,  anterior inter-axillary anastomosis, then focused on superior and medial breast redirecting towards anterior anastomosis, then into Rt S/L for focus to lateral and inferior breast and lateral trunk where larger area of visible edema present but already some reduced from last session so focused here redirecting towards lateral anastomosis, then finished retracing all steps to anastomoses in supine  04/02/23: Self Care Began session with educating daughters and pt in basics of anatomy of lymphatic system and principles of MLD before beginning MLD. Also further discussed compression bra. Showed compression bras to pt and daughters and explained how this will also help decrease lymphedema. Daughter (who works at [employer]) had to call multiple times to get someone on the phone at [facility] and finally got a Dec appt. Advised her of [facility] in [place] if that wasn't too far for them to drive to see if they could get her in sooner.  Also discussed and showed swell spots and how pt can benefit from this as well due to amount of fibrosis present in most of breast.  Manual Therapy MLD to Lt breast as follows: Short neck, 5 diaphragmatic breaths (took some time to instruct pt in belly breaths, not chest breathing), Lt inguinal nodes and Lt axillo-inguinal anastomosis, Rt axillary nodes, Rt anterior intact thorax, anterior inter-axillary anastomosis, then focused on superior and medial breast redirecting towards anterior anastomosis, then into Rt S/L for focus to lateral and inferior breast and lateral trunk where larger area of visible edema present so focused here redirecting towards lateral anastomosis, then finished retracing all steps in supine to anastomoses and began instructing daughter while performing in addition to instruction offered at beginning of session.   03/20/23: None today as pt was unaccompanied and has dementia, will begin at next session when her daughter can be here    PATIENT  EDUCATION:  Education details: lymphedema and how it is managed with manual lymphatic drainage and compression bra and swell spot or foam; began instruction of self MLD Person educated: Patient, daughter Education method: Explanation and Handouts, demonstration and verbal and tactile cues Education comprehension: verbalized understanding, will benefit from further review  HOME EXERCISE PROGRAM: Obtain compression bra  ASSESSMENT:  CLINICAL IMPRESSION: Continued with MLD to L breast today. Fibrosis softened with MLD today. She is awaiting arrival of compression bras that are more snug than her current one which will help manage her breast lymphedema.   OBJECTIVE IMPAIRMENTS: decreased knowledge of condition, decreased knowledge of use of DME, increased edema, and postural dysfunction.   ACTIVITY LIMITATIONS:  none  PARTICIPATION LIMITATIONS:  none  PERSONAL FACTORS: Time since onset of injury/illness/exacerbation and 1 comorbidity: dementia  are also affecting patient's functional outcome.   REHAB POTENTIAL: Good  CLINICAL DECISION MAKING: Stable/uncomplicated  EVALUATION COMPLEXITY: Low  GOALS: Goals reviewed with patient? Yes  SHORT TERM GOALS=LONG TERM GOALS Target date: 04/17/23  Pt will obtain a compression bra for long term management of L breast lymphedema.  Baseline: Goal status: MET 04/11/23   2.  Pt's daughters will be independent in breast MLD for long term management of lymphedema.  Baseline:  Goal status: MET/DEFERRED  3.  Pt will demonstrate a 50% improvement in fibrosis in L breast to decrease risk of infection.  Baseline:  Goal status: ONGOING  PLAN:  PT FREQUENCY: 1 visit per week   PT DURATION: 4 weeks   PLANNED INTERVENTIONS: 97140- Manual therapy, 97760- Orthotic Fit/training, Patient/Family education, Therapeutic exercises, Therapeutic activity, and Self Care  PLAN FOR NEXT SESSION: Cont MLD to L breast - daughters will not do at home.    Levis Nazir Breedlove [place], PT 06/06/2023, 2:59 PM  Self manual lymph drainage: Perform this sequence once a day.  Only give enough pressure to your skin to make the skin move.  Diaphragmatic - Supine   Inhale through nose making navel move out toward hands. Exhale through puckered lips, hands follow navel in. Repeat _5__ times. Rest _10__ seconds between repeats.   Copyright  VHI. All rights reserved.  Hug yourself.  Do circles at your neck just above your collarbones.  Repeat this 10 times.  Axilla - One at a Time   Using full weight of flat hand and fingers at center of uninvolved armpit, make _10__ in-place circles.   Copyright  VHI. All rights reserved.  LEG: Inguinal Nodes Stimulation   With small finger side of hand against hip crease on involved side,  gently perform circles at the crease. Repeat __10_ times.   Copyright  VHI. All rights reserved.  Axilla to Inguinal Nodes - Sweep   On involved side, pump _4__ times from armpit along side of trunk to hip crease.  Now gently stretch skin from the involved side to the uninvolved side across the chest at the shoulder line.  Repeat that 4 times.  Draw an imaginary diagonal line from upper outer breast through the nipple area toward lower inner breast.  Direct fluid upward and inward from this line toward the pathway across your upper chest .  Do this in three rows to treat all of the upper inner breast tissue, and do each row 3-4x.      Direct fluid to treat all of lower outer breast tissue downward and outward toward      pathway that is aimed at the left groin.  Finish by doing the pathways as described above going from your involved armpit to the same side groin and going across your upper chest from the involved shoulder to the uninvolved shoulder.  Repeat the steps above where you do circles in your left groin and right armpit. Copyright  VHI. All rights reserved.    Cancer Rehab [phone]

## 2023-06-07 ENCOUNTER — Other Ambulatory Visit: Payer: Medicare HMO

## 2023-06-13 ENCOUNTER — Encounter: Payer: Self-pay | Admitting: Physician Assistant

## 2023-06-17 ENCOUNTER — Ambulatory Visit: Payer: Medicare Other | Admitting: Internal Medicine

## 2023-06-17 ENCOUNTER — Ambulatory Visit: Payer: Medicare Other

## 2023-06-17 VITALS — BP 132/76 | HR 65 | Temp 98.0°F | Ht 65.0 in | Wt 130.0 lb

## 2023-06-17 DIAGNOSIS — L84 Corns and callosities: Secondary | ICD-10-CM

## 2023-06-17 DIAGNOSIS — Z0001 Encounter for general adult medical examination with abnormal findings: Secondary | ICD-10-CM | POA: Diagnosis not present

## 2023-06-17 DIAGNOSIS — E7849 Other hyperlipidemia: Secondary | ICD-10-CM

## 2023-06-17 DIAGNOSIS — I10 Essential (primary) hypertension: Secondary | ICD-10-CM

## 2023-06-17 DIAGNOSIS — M1711 Unilateral primary osteoarthritis, right knee: Secondary | ICD-10-CM | POA: Diagnosis not present

## 2023-06-17 DIAGNOSIS — I890 Lymphedema, not elsewhere classified: Secondary | ICD-10-CM

## 2023-06-17 DIAGNOSIS — L599 Disorder of the skin and subcutaneous tissue related to radiation, unspecified: Secondary | ICD-10-CM

## 2023-06-17 DIAGNOSIS — E039 Hypothyroidism, unspecified: Secondary | ICD-10-CM

## 2023-06-17 MED ORDER — AMLODIPINE BESYLATE 10 MG PO TABS: 10.0000 mg | ORAL_TABLET | Freq: Every day | ORAL | 3 refills | Status: DC

## 2023-06-17 NOTE — Patient Instructions (Signed)
Ok to increase the amlodipine to 10 mg per day  Please continue all other medications as before, and refills have been done if requested.  Please have the pharmacy call with any other refills you may need.  Please continue your efforts at being more active, low cholesterol diet, and weight control.  You are otherwise up to date with prevention measures today.  Please keep your appointments with your specialists as you may have planned  Please make an Appointment to return in 6 months, or sooner if needed

## 2023-06-17 NOTE — Progress Notes (Unsigned)
Patient ID: Angelica [name], female   DOB: December 21, 1940, 83 y.o.   MRN: [id number]         Chief Complaint:: wellness exam and htn, corn 5th to left foot, right knee djd, low thyroid, htn, hld       HPI:  Angelica [name] is a 83 y.o. female here for wellness exam; declines tdap and shingrix, o/w up to date                        Also Denies hyper or hypo thyroid symptoms such as voice, skin or hair change.  Pt denies chest pain, increased sob or doe, wheezing, orthopnea, PND, increased LE swelling, palpitations, dizziness or syncope.   Pt denies polydipsia, polyuria, or new focal neuro s/s.    Pt denies fever, wt loss, night sweats, loss of appetite, or other constitutional symptoms   Has recurring right knee arthritic pain mild intermittent worsneing.  Also has corn to 5th toe left foot with increased pain.     Wt Readings from Last 3 Encounters:  06/17/23 130 lb (59 kg)  04/10/23 131 lb 12.8 oz (59.8 kg)  03/07/23 122 lb 9.6 oz (55.6 kg)   BP Readings from Last 3 Encounters:  06/17/23 132/76  04/10/23 (!) 165/96  03/07/23 128/84   Immunization History  Administered Date(s) Administered   Fluad Quad(high Dose 65+) 01/16/2019, 03/05/2020, 02/01/2021, 02/26/2022   Fluad Trivalent(High Dose 65+) 02/13/2023   Influenza, High Dose Seasonal PF 01/30/2018   PFIZER Comirnaty(Gray Top)Covid-19 Tri-Sucrose Vaccine 02/23/2022   PFIZER(Purple Top)SARS-COV-2 Vaccination 07/13/2019, 08/03/2019, 02/16/2020   Pneumococcal Conjugate-13 11/08/2017   Health Maintenance Due  Topic Date Due   DTaP/Tdap/Td (1 - Tdap) Never done   Zoster Vaccines- Shingrix (1 of 2) Never done   Medicare Annual Wellness (AWV)  03/06/2023      Past Medical History:  Diagnosis Date   Arthritis    Asthma    Bowel obstruction (HCC)    Cancer (HCC)    Environmental allergies    GERD (gastroesophageal reflux disease)    Glaucoma 05/09/2017   H. pylori infection    History of radiation therapy 07/05/20-08/05/20    Left lung IMRT Dr. Roselind Messier    HLD (hyperlipidemia) 01/16/2019   Hypertension    Iron deficiency anemia    Osteopenia 05/26/2017   Thyroid disease    Urticaria 07/25/2018   Past Surgical History:  Procedure Laterality Date   ABDOMINAL HYSTERECTOMY     BOWEL RESECTION N/A 05/23/2020   Procedure: SMALL BOWEL REPAIR;  Surgeon: Sheliah Hatch, De Blanch, MD;  Location: [facility] OR;  Service: General;  Laterality: N/A;   BREAST BIOPSY     BRONCHIAL BIOPSY  06/10/2020   Procedure: BRONCHIAL BIOPSIES;  Surgeon: [name], DO;  Location: [facility];  Service: Pulmonary;;   BRONCHIAL BRUSHINGS  06/10/2020   Procedure: BRONCHIAL BRUSHINGS;  Surgeon: [name], DO;  Location: [facility];  Service: Pulmonary;;   BRONCHIAL NEEDLE ASPIRATION BIOPSY  06/10/2020   Procedure: BRONCHIAL NEEDLE ASPIRATION BIOPSIES;  Surgeon: [name], DO;  Location: [facility];  Service: Pulmonary;;   BRONCHIAL WASHINGS  06/10/2020   Procedure: BRONCHIAL WASHINGS;  Surgeon: [name], DO;  Location: [facility];  Service: Pulmonary;;   COLON SURGERY     DIAGNOSTIC LARYNGOSCOPY N/A 05/23/2020   Procedure: ATTEMPTED DIAGNOSTIC LAPAROSCOPY WITH ADHESIONS;  Surgeon: Sheliah Hatch, De Blanch, MD;  Location: [facility];  Service: General;  Laterality: N/A;   IR  IMAGING GUIDED PORT INSERTION  07/15/2020   IR REMOVAL TUN ACCESS W/ PORT W/O FL MOD SED  01/16/2023   KNEE SURGERY     LAPAROTOMY N/A 04/18/2015   Procedure: Exploratory laparotomy with lysis of adhesions, possible bowel resection;  Surgeon: Axel Filler, MD;  Location: [facility];  Service: General;  Laterality: N/A;   LAPAROTOMY N/A 05/23/2020   Procedure: EXPLORATORY LAPAROTOMY;  Surgeon: Rodman Pickle, MD;  Location: [facility];  Service: General;  Laterality: N/A;   LYSIS OF ADHESION N/A 05/23/2020   Procedure: LYSIS OF ADHESION;  Surgeon: Sheliah Hatch De Blanch, MD;  Location: [facility] OR;  Service: General;  Laterality: N/A;   NASAL SINUS SURGERY     THORACENTESIS  Left 12/03/2022   Procedure: THORACENTESIS;  Surgeon: [name], DO;  Location: [facility];  Service: Pulmonary;  Laterality: Left;   THYROID SURGERY     VIDEO BRONCHOSCOPY WITH ENDOBRONCHIAL NAVIGATION Bilateral 06/10/2020   Procedure: VIDEO BRONCHOSCOPY WITH ENDOBRONCHIAL NAVIGATION;  Surgeon: [name], DO;  Location: [facility];  Service: Pulmonary;  Laterality: Bilateral;   VIDEO BRONCHOSCOPY WITH ENDOBRONCHIAL ULTRASOUND  06/10/2020   Procedure: VIDEO BRONCHOSCOPY WITH ENDOBRONCHIAL ULTRASOUND;  Surgeon: [name], DO;  Location: [facility];  Service: Pulmonary;;    reports that she has quit smoking. Her smoking use included cigarettes. She has never used smokeless tobacco. She reports that she does not drink alcohol and does not use drugs. family history includes Diabetes in her mother; Glaucoma in her brother and sister; Hypertension in her father and mother. Allergies  Allergen Reactions   Asa Buff (Mag [Buffered Aspirin] Other (See Comments)    Excessive sweating   Ensure [Nutritional Supplements]     Or boost - upset stomach    Fish Allergy Other (See Comments)    Unknown reaction, but allergic   Other Other (See Comments)    Gelcaps; gel-containing capsules; extended-release medication - upset stomach    Peanut-Containing Drug Products Other (See Comments)    From allergy test   Penicillins Itching   Shellfish-Derived Products Other (See Comments)    From allergy test   Strawberry Extract Nausea And Vomiting and Other (See Comments)    From allergy test    Current Outpatient Medications on File Prior to Visit  Medication Sig Dispense Refill   acetaminophen (TYLENOL) 500 MG tablet Take 1,000 mg by mouth every 6 (six) hours as needed for moderate pain.     albuterol (VENTOLIN HFA) 108 (90 Base) MCG/ACT inhaler Inhale 2 puffs into the lungs every 4 (four) hours as needed for wheezing or shortness of breath. 18 g 1   alum & mag hydroxide-simeth  (MAALOX/MYLANTA) 200-200-20 MG/5ML suspension Take 15 mLs by mouth every 6 (six) hours as needed for indigestion or heartburn.     clopidogrel (PLAVIX) 75 MG tablet Take 1 tablet by mouth once daily 90 tablet 3   diclofenac Sodium (VOLTAREN) 1 % GEL Apply 4 g topically 4 (four) times daily. (Patient taking differently: Apply 4 g topically 4 (four) times daily as needed (pain).) 100 g 0   EPINEPHrine (EPIPEN 2-PAK) 0.3 mg/0.3 mL IJ SOAJ injection Use as directed for severe allergic reaction 2 each 1   EQ ALLERGY RELIEF, CETIRIZINE, 10 MG tablet Take 1 tablet by mouth once daily 90 tablet 0   fluticasone (FLONASE) 50 MCG/ACT nasal spray USE 1 SPRAY(S) IN EACH NOSTRIL IN THE MORNING AND AT BEDTIME 16 g 0   furosemide (LASIX) 40 MG tablet Take  1 tablet (40 mg total) by mouth daily. (Patient taking differently: Take 40 mg by mouth daily as needed for edema.) 30 tablet 11   ipratropium (ATROVENT) 0.06 % nasal spray Place 2 sprays into both nostrils 4 (four) times daily as needed (nasal drainage). (Patient taking differently: Place 2 sprays into both nostrils daily.) 15 mL 5   levothyroxine (SYNTHROID) 50 MCG tablet TAKE 1 TABLET BY MOUTH ONCE DAILY BEFORE BREAKFAST 90 tablet 3   Magnesium Hydroxide (PHILLIPS MILK OF MAGNESIA PO) Take 15-30 mLs by mouth daily as needed (for constipation).     memantine (NAMENDA) 10 MG tablet Take 1 tablet by mouth twice daily 60 tablet 3   montelukast (SINGULAIR) 10 MG tablet TAKE 1 TABLET BY MOUTH AT BEDTIME 30 tablet 5   ondansetron (ZOFRAN-ODT) 4 MG disintegrating tablet Take 1 tablet (4 mg total) by mouth every 8 (eight) hours as needed for nausea or vomiting. 10 tablet 0   pantoprazole (PROTONIX) 40 MG tablet Take 1 tablet by mouth once daily 90 tablet 2   potassium chloride SA (KLOR-CON M) 20 MEQ tablet Take 2 tablets (40 mEq total) by mouth daily as needed. Take potasium days you take lasix. 60 tablet 5   pravastatin (PRAVACHOL) 80 MG tablet Take 1 tablet by mouth  once daily 90 tablet 3   SYMBICORT 80-4.5 MCG/ACT inhaler INHALE 2 PUFFS BY MOUTH IN THE MORNING AND AT BEDTIME (Patient taking differently: Inhale into the lungs See admin instructions. Inhale 2 puffs once a day, may take a second dose of 2 puffs when needed for shortness of breath) 10.2 g 5   Current Facility-Administered Medications on File Prior to Visit  Medication Dose Route Frequency Provider Last Rate Last Admin   Benralizumab SOSY 30 mg  30 mg Subcutaneous Q8 Weeks Marcelyn Bruins, MD   30 mg at 05/29/23 1007        ROS:  All others reviewed and negative.  Objective        PE:  BP 132/76   Pulse 65   Temp 98 F (36.7 C) (Temporal)   Ht 5\' 5"  (1.651 m)   Wt 130 lb (59 kg)   SpO2 99%   BMI 21.63 kg/m                 Constitutional: Pt appears in NAD               HENT: Head: NCAT.                Right Ear: External ear normal.                 Left Ear: External ear normal.                Eyes: . Pupils are equal, round, and reactive to light. Conjunctivae and EOM are normal               Nose: without d/c or deformity               Neck: Neck supple. Gross normal ROM               Cardiovascular: Normal rate and regular rhythm.                 Pulmonary/Chest: Effort normal and breath sounds without rales or wheezing.                Abd:  Soft, NT, ND, + BS, no organomegaly  Neurological: Pt is alert. At baseline orientation, motor grossly intact               Skin: Skin is warm. No rashes, no other new lesions, LE edema - none               Psychiatric: Pt behavior is normal without agitation   Micro: none  Cardiac tracings I have personally interpreted today:  none  Pertinent Radiological findings (summarize): none   Lab Results  Component Value Date   WBC 4.5 01/08/2023   HGB 14.2 01/08/2023   HCT 41.2 01/08/2023   PLT 196 01/08/2023   GLUCOSE 94 01/08/2023   CHOL 191 07/18/2021   TRIG 83.0 07/18/2021   HDL 77.50 07/18/2021   LDLCALC  97 07/18/2021   ALT 10 01/08/2023   AST 19 01/08/2023   NA 140 01/08/2023   K 3.9 01/08/2023   CL 106 01/08/2023   CREATININE 0.86 01/08/2023   BUN 11 01/08/2023   CO2 29 01/08/2023   TSH 2.400 04/10/2023   INR 1.0 11/02/2020   HGBA1C 5.8 07/18/2021   Assessment/Plan:  DEMISHA NOKES is a 83 y.o. Black or African American [2] female with  has a past medical history of Arthritis, Asthma, Bowel obstruction (HCC), Cancer (HCC), Environmental allergies, GERD (gastroesophageal reflux disease), Glaucoma (05/09/2017), H. pylori infection, History of radiation therapy (07/05/20-08/05/20), HLD (hyperlipidemia) (01/16/2019), Hypertension, Iron deficiency anemia, Osteopenia (05/26/2017), Thyroid disease, and Urticaria (07/25/2018).  Encounter for well adult exam with abnormal findings Age and sex appropriate education and counseling updated with regular exercise and diet Referrals for preventative services - none needed Immunizations addressed - delcines tdap and shingrix Smoking counseling  - none needed Evidence for depression or other mood disorder - none significant Most recent labs reviewed. I have personally reviewed and have noted: 1) the patient's medical and social history 2) The patient's current medications and supplements 3) The patient's height, weight, and BMI have been recorded in the chart   HLD (hyperlipidemia) Lab Results  Component Value Date   LDLCALC 97 07/18/2021   Stable, pt to continue current statin pravachol 80 qd   HTN (hypertension) BP Readings from Last 3 Encounters:  06/17/23 132/76  04/10/23 (!) 165/96  03/07/23 128/84   Has been mild elev at home - no for increased amlodipine 10 qd   Hypothyroidism Lab Results  Component Value Date   TSH 2.400 04/10/2023   Stable, pt to continue levothyroxine 50 mcg qd   Arthritis of right knee Mild to mod, for otc volt gel prn,  to f/u any worsening symptoms or concerns  Corn of toe Left 5th toe - mild, pt  declines podiatry for now  Followup: Return in about 6 months (around 12/15/2023).  Oliver Barre, MD 06/20/2023 10:11 PM [facility]

## 2023-06-17 NOTE — Therapy (Signed)
OUTPATIENT PHYSICAL THERAPY  UPPER EXTREMITY ONCOLOGY TREATMENT     Patient Name: Angelica [name] MRN: [id number] DOB:06-20-1940, 83 y.o., female Today's Date: 06/17/2023  END OF SESSION:  PT End of Session - 06/17/23 0758     Visit Number 11    Number of Visits 13    Date for PT Re-Evaluation 06/27/23    Authorization Time Period 05/30/2023-06/27/2023    Authorization - Visit Number 3    Authorization - Number of Visits 5    PT Start Time 0800    PT Stop Time 0849    PT Time Calculation (min) 49 min    Activity Tolerance Patient tolerated treatment well    Behavior During Therapy [facility] for tasks assessed/performed                Past Medical History:  Diagnosis Date   Arthritis    Asthma    Bowel obstruction (HCC)    Cancer (HCC)    Environmental allergies    GERD (gastroesophageal reflux disease)    Glaucoma 05/09/2017   H. pylori infection    History of radiation therapy 07/05/20-08/05/20   Left lung IMRT Dr. Roselind Messier    HLD (hyperlipidemia) 01/16/2019   Hypertension    Iron deficiency anemia    Osteopenia 05/26/2017   Thyroid disease    Urticaria 07/25/2018   Past Surgical History:  Procedure Laterality Date   ABDOMINAL HYSTERECTOMY     BOWEL RESECTION N/A 05/23/2020   Procedure: SMALL BOWEL REPAIR;  Surgeon: Sheliah Hatch, De Blanch, MD;  Location: [facility] OR;  Service: General;  Laterality: N/A;   BREAST BIOPSY     BRONCHIAL BIOPSY  06/10/2020   Procedure: BRONCHIAL BIOPSIES;  Surgeon: [name], DO;  Location: [facility] END[facility];  Service: Pulmonary;;   BRONCHIAL BRUSHINGS  06/10/2020   Procedure: BRONCHIAL BRUSHINGS;  Surgeon: [name], DO;  Location: [facility];  Service: Pulmonary;;   BRONCHIAL NEEDLE ASPIRATION BIOPSY  06/10/2020   Procedure: BRONCHIAL NEEDLE ASPIRATION BIOPSIES;  Surgeon: [name], DO;  Location: [facility];  Service: Pulmonary;;   BRONCHIAL WASHINGS  06/10/2020   Procedure: BRONCHIAL WASHINGS;  Surgeon: [name], DO;   Location: [facility];  Service: Pulmonary;;   COLON SURGERY     DIAGNOSTIC LARYNGOSCOPY N/A 05/23/2020   Procedure: ATTEMPTED DIAGNOSTIC LAPAROSCOPY WITH ADHESIONS;  Surgeon: Sheliah Hatch, De Blanch, MD;  Location: [facility] OR;  Service: General;  Laterality: N/A;   IR IMAGING GUIDED PORT INSERTION  07/15/2020   IR REMOVAL TUN ACCESS W/ PORT W/O FL MOD SED  01/16/2023   KNEE SURGERY     LAPAROTOMY N/A 04/18/2015   Procedure: Exploratory laparotomy with lysis of adhesions, possible bowel resection;  Surgeon: Axel Filler, MD;  Location: [facility];  Service: General;  Laterality: N/A;   LAPAROTOMY N/A 05/23/2020   Procedure: EXPLORATORY LAPAROTOMY;  Surgeon: Rodman Pickle, MD;  Location: [facility];  Service: General;  Laterality: N/A;   LYSIS OF ADHESION N/A 05/23/2020   Procedure: LYSIS OF ADHESION;  Surgeon: Sheliah Hatch De Blanch, MD;  Location: [facility];  Service: General;  Laterality: N/A;   NASAL SINUS SURGERY     THORACENTESIS Left 12/03/2022   Procedure: THORACENTESIS;  Surgeon: [name], DO;  Location: [facility];  Service: Pulmonary;  Laterality: Left;   THYROID SURGERY     VIDEO BRONCHOSCOPY WITH ENDOBRONCHIAL NAVIGATION Bilateral 06/10/2020   Procedure: VIDEO BRONCHOSCOPY WITH ENDOBRONCHIAL NAVIGATION;  Surgeon: [name], DO;  Location: [facility];  Service:  Pulmonary;  Laterality: Bilateral;   VIDEO BRONCHOSCOPY WITH ENDOBRONCHIAL ULTRASOUND  06/10/2020   Procedure: VIDEO BRONCHOSCOPY WITH ENDOBRONCHIAL ULTRASOUND;  Surgeon: [name], DO;  Location: [facility];  Service: Pulmonary;;   Patient Active Problem List   Diagnosis Date Noted   Mastodynia of left breast 02/13/2023   Pleural effusion 11/21/2022   Swelling of breast 11/17/2022   Dyspnea 11/14/2022   Cellulitis of left breast 10/17/2022   Iron deficiency anemia 10/11/2022   Acute respiratory failure with hypoxia (HCC) 10/05/2022   Bilateral pleural effusion 10/05/2022   History of stroke 10/05/2022    Weakness 10/05/2022   Edema 10/05/2022   Alzheimer's disease (HCC) 09/06/2022   Ex-smoker 09/06/2022   Hypothyroidism 09/06/2022   Right leg pain 07/13/2021   Dementia (HCC) 07/13/2021   RLQ abdominal pain 07/13/2021   Encounter for well adult exam with abnormal findings 06/10/2021   Anosmia 05/01/2021   Chronic ethmoidal sinusitis 02/14/2021   Debility 02/01/2021   Gait disorder 02/01/2021   Malignant neoplasm of bronchus and lung (HCC)    Port-A-Cath in place 11/02/2020   Adenocarcinoma of left lung, stage 3 (HCC) 06/22/2020   Lung mass    Pulmonary nodule 05/17/2020   Iron deficiency 05/03/2020   Swelling of lower extremity 07/30/2019   Ganglion cyst of right foot 01/16/2019   HLD (hyperlipidemia) 01/16/2019   LPRD (laryngopharyngeal reflux disease) 07/25/2018   Constipation 08/19/2017   Asthma, severe persistent, well-controlled 06/04/2017   Chronic pansinusitis 06/04/2017   Nasal polyposis 06/04/2017   Osteopenia 05/26/2017   GERD (gastroesophageal reflux disease)    H. pylori infection    Other allergic rhinitis 03/20/2016   Malnutrition of moderate degree 04/11/2015   HTN (hypertension) 04/10/2015    PCP: Oliver Barre, MD  REFERRING PROVIDER: Harriette Bouillon, MD  REFERRING DIAG: I89.0 (ICD-10-CM) - Lymphedema, not elsewhere classified  THERAPY DIAG:  Lymphedema, not elsewhere classified  Disorder of the skin and subcutaneous tissue related to radiation, unspecified  ONSET DATE: pt unable to give a date - "it has been a while"  Rationale for Evaluation and Treatment: Rehabilitation  SUBJECTIVE:                                                                                                                                                                                           SUBJECTIVE STATEMENT:  My swelling is getting better. It still feels a little hard. My breast feels softer after I do the drainage. I do it every night. I saw the doctor. He thinks it is  doing better also.. At end of session pts daughter came in; pt has appt today  in [place] for bra fitting for another compression bra and form to even size of breasts.  PERTINENT HISTORY: history of non-small cell lung cancer with most recent CT 10/05/2022 demonstrating diffuse anasarca, as well as bilateral pleural effusions, hx of radiation to chest,  hx of breast biopsy (benign), dementia and Alzheimer's   PAIN:  Are you having pain? Yes knee hurts, unable to rate, reports it is from arthritis and worse when walking  PRECAUTIONS: Other: chart states pt has Alzheimer's/dementia - pt unable to verify diagnosis in chart, no one else present with pt to assist  RED FLAGS: Unable to ask as pt has memory issues and no one present at appointment  WEIGHT BEARING RESTRICTIONS: No  FALLS:  Has patient fallen in last 6 months? No  LIVING ENVIRONMENT: Lives with: lives with their daughter Lives in: House/apartment Has following equipment at home: Dan Humphreys - 4 wheeled pt reports she does not use it  OCCUPATION: retired  LEISURE: pt reports she tries to walk  HAND DOMINANCE: right   PRIOR LEVEL OF FUNCTION: Independent does have memory issues  PATIENT GOALS: pt reports she does not know   OBJECTIVE: Note: Objective measures were completed at Evaluation unless otherwise noted.  COGNITION: Overall cognitive status: Within functional limits for tasks assessed   PALPATION: Fibrosis present in inferior L breast  OBSERVATIONS / OTHER ASSESSMENTS: L significantly larger than R breast, approximately double the size  POSTURE: forward head, rounded shoulders  UPPER EXTREMITY AROM/PROM: WFL  CERVICAL AROM: All within normal limits:    Percent limited  Flexion WFL  Extension 75% limited  Right lateral flexion WFL  Left lateral flexion 25% limited  Right rotation WFL  Left rotation WFL    LYMPHEDEMA ASSESSMENTS:   LANDMARK RIGHT  eval  At axilla  28.5  15 cm proximal to  olecranon process 25.6  10 cm proximal to olecranon process 25.5  Olecranon process 22.5  15 cm proximal to ulnar styloid process 22.1  10 cm proximal to ulnar styloid process 18.5  Just proximal to ulnar styloid process 14.5  Across hand at thumb web space 18.8  At base of 2nd digit 6  (Blank rows = not tested)  LANDMARK LEFT  eval  At axilla  28.5  15 cm proximal to olecranon process 26.5  10 cm proximal to olecranon process 25.9  Olecranon process 24  15 cm proximal to ulnar styloid process 21.5  10 cm proximal to ulnar styloid process 18.1  Just proximal to ulnar styloid process 14.5  Across hand at thumb web space 18.2  At base of 2nd digit 5.6  (Blank rows = not tested)  LLIS:  Flowsheet Row Outpatient Rehab from 03/20/2023 in [facility]  Lymphedema Life Impact Scale Total Score 14.71 %      14.71   BREAST COMPLAINTS QUESTIONNAIRE Pain:  0 Heaviness: 5 Swollen feeling:0 Tense Skin: 0 Redness: 0 Bra Print: 0 Size of Pores: 0 Hard feeling:  3 Total:    8 /80 A Score over 9 indicates lymphedema issues in the breast   TODAY'S TREATMENT:  DATE:   06/17/2023 With pt permission;daughter did not come in Left breast still swollen with increased fibrosis left medial inferior breast, and enlarged pores, and thickened skin but improving In supine: Short neck, 5 diaphragmatic breaths, bil axillary nodes and establishment of interaxillary pathway, L inguinal nodes and establishment of axilloinguinal pathway, then L breast moving fluid towards pathways spending extra time in any areas of fibrosis then retracing all steps. Extra time spent at medial breast. Had pt practice anterior interaxillary pathway and left medial breast but required multiple VC's    06/06/23 In supine: Short neck, 5 diaphragmatic breaths,  bil axillary nodes and establishment of interaxillary pathway, L inguinal nodes and establishment of axilloinguinal pathway, then L breast moving fluid towards pathways spending extra time in any areas of fibrosis then retracing all steps. Daughter was present.  05/30/23 In supine: Short neck, 5 diaphragmatic breaths, bil axillary nodes and establishment of interaxillary pathway, L inguinal nodes and establishment of axilloinguinal pathway, then L breast moving fluid towards pathways spending extra time in any areas of fibrosis then retracing all steps. Daughter was present.  05/16/23 In supine: Short neck, 5 diaphragmatic breaths, R axillary nodes and establishment of interaxillary pathway, L inguinal nodes and establishment of axilloinguinal pathway, then L breast moving fluid towards pathways spending extra time in any areas of fibrosis then retracing all steps. Daughter was present.  04/25/2023 Significant fibrosis still noted at inferior medial breast, and thickening noted laterally but less so. In supine: Short neck, 5 diaphragmatic breaths, R axillary nodes and establishment of interaxillary pathway, L inguinal nodes and establishment of axilloinguinal pathway, then L breast moving fluid towards pathways spending extra time in any areas of fibrosis then retracing all steps. Pt educated in LN activation and medial breast but required frequent TC's/VC's. Daughter Dennie Bible was present and  has practiced 2x's previously while here, but has not done on her mom at home. She has no questions. She wants her sister Vashti Hey to practice again;appt set up for 05/16/23 at 4:00. She spoke with her sister while they were here and Taron agreed to appt on 12/26  04/11/23: In supine: Short neck, 5 diaphragmatic breaths, R axillary nodes and establishment of interaxillary pathway, L inguinal nodes and establishment of axilloinguinal pathway, then L breast moving fluid towards pathways spending extra time in any areas of  fibrosis then retracing all steps.   04/09/23: In supine: Short neck, 5 diaphragmatic breaths, R axillary nodes and establishment of interaxillary pathway, L inguinal nodes and establishment of axilloinguinal pathway, then L breast moving fluid towards pathways spending extra time in any areas of fibrosis then retracing all steps. Instructed pt and her daughter throughout and had pt's daughter return demonstrate each step while providing v/c and t/c for correct skin stretch technique and sequence. Issued handout. Educated pt and daughter that they can just use the interaxillary pathway if it is easier at home.   04/04/23: Manual Therapy MLD to Lt breast as follows: Short neck, superficial and deep abdominals (continued with instruction of belly breaths, not chest breathing), Lt inguinal nodes and Lt axillo-inguinal anastomosis, Rt axillary nodes, Rt anterior intact thorax, anterior inter-axillary anastomosis, then focused on superior and medial breast redirecting towards anterior anastomosis, then into Rt S/L for focus to lateral and inferior breast and lateral trunk where larger area of visible edema present but already some reduced from last session so focused here redirecting towards lateral anastomosis, then finished retracing all steps to anastomoses in supine  04/02/23: Self Care  Began session with educating daughters and pt in basics of anatomy of lymphatic system and principles of MLD before beginning MLD. Also further discussed compression bra. Showed compression bras to pt and daughters and explained how this will also help decrease lymphedema. Daughter (who works at [employer]) had to call multiple times to get someone on the phone at [facility] [place] and finally got a Dec appt. Advised her of [facility] in [place] if that wasn't too far for them to drive to see if they could get her in sooner. Also discussed and showed swell spots and how pt can benefit from this as well due to amount of  fibrosis present in most of breast.  Manual Therapy MLD to Lt breast as follows: Short neck, 5 diaphragmatic breaths (took some time to instruct pt in belly breaths, not chest breathing), Lt inguinal nodes and Lt axillo-inguinal anastomosis, Rt axillary nodes, Rt anterior intact thorax, anterior inter-axillary anastomosis, then focused on superior and medial breast redirecting towards anterior anastomosis, then into Rt S/L for focus to lateral and inferior breast and lateral trunk where larger area of visible edema present so focused here redirecting towards lateral anastomosis, then finished retracing all steps in supine to anastomoses and began instructing daughter while performing in addition to instruction offered at beginning of session.   03/20/23: None today as pt was unaccompanied and has dementia, will begin at next session when her daughter can be here    PATIENT EDUCATION:  Education details: lymphedema and how it is managed with manual lymphatic drainage and compression bra and swell spot or foam; began instruction of self MLD Person educated: Patient, daughter Education method: Explanation and Handouts, demonstration and verbal and tactile cues Education comprehension: verbalized understanding, will benefit from further review  HOME EXERCISE PROGRAM: Obtain compression bra  ASSESSMENT:  CLINICAL IMPRESSION:  Medial breast fibrosis improved after MLD today. Pt is doing MLD daily but did need VC's and TC's to do properly today. Pt has appt today in [place] to get fit for another compression bra and breast form. OBJECTIVE IMPAIRMENTS: decreased knowledge of condition, decreased knowledge of use of DME, increased edema, and postural dysfunction.   ACTIVITY LIMITATIONS:  none  PARTICIPATION LIMITATIONS:  none  PERSONAL FACTORS: Time since onset of injury/illness/exacerbation and 1 comorbidity: dementia  are also affecting patient's functional outcome.   REHAB POTENTIAL:  Good  CLINICAL DECISION MAKING: Stable/uncomplicated  EVALUATION COMPLEXITY: Low  GOALS: Goals reviewed with patient? Yes  SHORT TERM GOALS=LONG TERM GOALS Target date: 04/17/23  Pt will obtain a compression bra for long term management of L breast lymphedema.  Baseline: Goal status: MET 04/11/23   2.  Pt's daughters will be independent in breast MLD for long term management of lymphedema.  Baseline:  Goal status: MET/DEFERRED  3.  Pt will demonstrate a 50% improvement in fibrosis in L breast to decrease risk of infection.  Baseline:  Goal status: ONGOING  PLAN:  PT FREQUENCY: 1 visit per week   PT DURATION: 4 weeks   PLANNED INTERVENTIONS: 97140- Manual therapy, 97760- Orthotic Fit/training, Patient/Family education, Therapeutic exercises, Therapeutic activity, and Self Care  PLAN FOR NEXT SESSION: Cont MLD to L breast - daughters will not do at home. Check compression bra she is getting today, DC?  Waynette Buttery, PT 06/17/2023, 8:56 AM  Self manual lymph drainage: Perform this sequence once a day.  Only give enough pressure to your skin to make the skin move.  Diaphragmatic - Supine   Inhale  through nose making navel move out toward hands. Exhale through puckered lips, hands follow navel in. Repeat _5__ times. Rest _10__ seconds between repeats.   Copyright  VHI. All rights reserved.  Hug yourself.  Do circles at your neck just above your collarbones.  Repeat this 10 times.  Axilla - One at a Time   Using full weight of flat hand and fingers at center of uninvolved armpit, make _10__ in-place circles.   Copyright  VHI. All rights reserved.  LEG: Inguinal Nodes Stimulation   With small finger side of hand against hip crease on involved side, gently perform circles at the crease. Repeat __10_ times.   Copyright  VHI. All rights reserved.  Axilla to Inguinal Nodes - Sweep   On involved side, pump _4__ times from armpit along side of trunk to hip  crease.  Now gently stretch skin from the involved side to the uninvolved side across the chest at the shoulder line.  Repeat that 4 times.  Draw an imaginary diagonal line from upper outer breast through the nipple area toward lower inner breast.  Direct fluid upward and inward from this line toward the pathway across your upper chest .  Do this in three rows to treat all of the upper inner breast tissue, and do each row 3-4x.      Direct fluid to treat all of lower outer breast tissue downward and outward toward      pathway that is aimed at the left groin.  Finish by doing the pathways as described above going from your involved armpit to the same side groin and going across your upper chest from the involved shoulder to the uninvolved shoulder.  Repeat the steps above where you do circles in your left groin and right armpit. Copyright  VHI. All rights reserved.    Cancer Rehab [phone]

## 2023-06-20 ENCOUNTER — Ambulatory Visit
Admission: RE | Admit: 2023-06-20 | Discharge: 2023-06-20 | Disposition: A | Discharge status: Home / Self Care | Payer: Medicare Other | Source: Ambulatory Visit | Attending: Internal Medicine | Admitting: Internal Medicine

## 2023-06-20 ENCOUNTER — Encounter: Payer: Self-pay | Admitting: Internal Medicine

## 2023-06-20 DIAGNOSIS — L84 Corns and callosities: Secondary | ICD-10-CM | POA: Insufficient documentation

## 2023-06-20 DIAGNOSIS — C3490 Malignant neoplasm of unspecified part of unspecified bronchus or lung: Secondary | ICD-10-CM

## 2023-06-20 DIAGNOSIS — M1711 Unilateral primary osteoarthritis, right knee: Secondary | ICD-10-CM | POA: Insufficient documentation

## 2023-06-20 MED ORDER — IOPAMIDOL (ISOVUE-300) INJECTION 61%
100.0000 mL | Freq: Once | INTRAVENOUS | Status: AC | PRN
  Administered 2023-06-20: 75 mL via INTRAVENOUS

## 2023-06-20 NOTE — Assessment & Plan Note (Signed)
Left 5th toe - mild, pt declines podiatry for now

## 2023-06-20 NOTE — Assessment & Plan Note (Signed)
Lab Results  Component Value Date   TSH 2.400 04/10/2023   Stable, pt to continue levothyroxine 50 mcg qd

## 2023-06-20 NOTE — Assessment & Plan Note (Signed)
Mild to mod, for otc volt gel prn,  to f/u any worsening symptoms or concerns

## 2023-06-20 NOTE — Assessment & Plan Note (Signed)
Lab Results  Component Value Date   LDLCALC 97 07/18/2021   Stable, pt to continue current statin pravachol 80 qd

## 2023-06-20 NOTE — Assessment & Plan Note (Addendum)
BP Readings from Last 3 Encounters:  06/17/23 132/76  04/10/23 (!) 165/96  03/07/23 128/84   Has been mild elev at home - no for increased amlodipine 10 qd

## 2023-06-20 NOTE — Assessment & Plan Note (Signed)
Age and sex appropriate education and counseling updated with regular exercise and diet Referrals for preventative services - none needed Immunizations addressed - delcines tdap and shingrix Smoking counseling  - none needed Evidence for depression or other mood disorder - none significant Most recent labs reviewed. I have personally reviewed and have noted: 1) the patient's medical and social history 2) The patient's current medications and supplements 3) The patient's height, weight, and BMI have been recorded in the chart

## 2023-06-24 ENCOUNTER — Ambulatory Visit: Payer: Medicare Other | Attending: Surgery | Admitting: Rehabilitation

## 2023-06-24 ENCOUNTER — Encounter: Payer: Self-pay | Admitting: Rehabilitation

## 2023-06-24 DIAGNOSIS — L599 Disorder of the skin and subcutaneous tissue related to radiation, unspecified: Secondary | ICD-10-CM | POA: Insufficient documentation

## 2023-06-24 DIAGNOSIS — I890 Lymphedema, not elsewhere classified: Secondary | ICD-10-CM | POA: Insufficient documentation

## 2023-06-24 NOTE — Therapy (Signed)
OUTPATIENT PHYSICAL THERAPY  UPPER EXTREMITY ONCOLOGY TREATMENT     Patient Name: Angelica [name] MRN: [id number] DOB:1940/06/23, 83 y.o., female Today's Date: 06/24/2023  END OF SESSION:  PT End of Session - 06/24/23 1543     Visit Number 12    Number of Visits 17    Date for PT Re-Evaluation 07/29/23    Authorization Type submitted 06/24/23 for extension    PT Start Time 1500    PT Stop Time 1545    PT Time Calculation (min) 45 min    Activity Tolerance Patient tolerated treatment well    Behavior During Therapy [facility] for tasks assessed/performed                 Past Medical History:  Diagnosis Date   Arthritis    Asthma    Bowel obstruction (HCC)    Cancer (HCC)    Environmental allergies    GERD (gastroesophageal reflux disease)    Glaucoma 05/09/2017   H. pylori infection    History of radiation therapy 07/05/20-08/05/20   Left lung IMRT Dr. Roselind Messier    HLD (hyperlipidemia) 01/16/2019   Hypertension    Iron deficiency anemia    Osteopenia 05/26/2017   Thyroid disease    Urticaria 07/25/2018   Past Surgical History:  Procedure Laterality Date   ABDOMINAL HYSTERECTOMY     BOWEL RESECTION N/A 05/23/2020   Procedure: SMALL BOWEL REPAIR;  Surgeon: Sheliah Hatch De Blanch, MD;  Location: [facility] OR;  Service: General;  Laterality: N/A;   BREAST BIOPSY     BRONCHIAL BIOPSY  06/10/2020   Procedure: BRONCHIAL BIOPSIES;  Surgeon: [name], DO;  Location: [facility];  Service: Pulmonary;;   BRONCHIAL BRUSHINGS  06/10/2020   Procedure: BRONCHIAL BRUSHINGS;  Surgeon: [name], DO;  Location: [facility];  Service: Pulmonary;;   BRONCHIAL NEEDLE ASPIRATION BIOPSY  06/10/2020   Procedure: BRONCHIAL NEEDLE ASPIRATION BIOPSIES;  Surgeon: [name], DO;  Location: [facility];  Service: Pulmonary;;   BRONCHIAL WASHINGS  06/10/2020   Procedure: BRONCHIAL WASHINGS;  Surgeon: [name], DO;  Location: [facility];  Service: Pulmonary;;   COLON SURGERY      DIAGNOSTIC LARYNGOSCOPY N/A 05/23/2020   Procedure: ATTEMPTED DIAGNOSTIC LAPAROSCOPY WITH ADHESIONS;  Surgeon: Sheliah Hatch, De Blanch, MD;  Location: [facility] OR;  Service: General;  Laterality: N/A;   IR IMAGING GUIDED PORT INSERTION  07/15/2020   IR REMOVAL TUN ACCESS W/ PORT W/O FL MOD SED  01/16/2023   KNEE SURGERY     LAPAROTOMY N/A 04/18/2015   Procedure: Exploratory laparotomy with lysis of adhesions, possible bowel resection;  Surgeon: Axel Filler, MD;  Location: [facility] OR;  Service: General;  Laterality: N/A;   LAPAROTOMY N/A 05/23/2020   Procedure: EXPLORATORY LAPAROTOMY;  Surgeon: Rodman Pickle, MD;  Location: [facility] OR;  Service: General;  Laterality: N/A;   LYSIS OF ADHESION N/A 05/23/2020   Procedure: LYSIS OF ADHESION;  Surgeon: Sheliah Hatch De Blanch, MD;  Location: [facility] OR;  Service: General;  Laterality: N/A;   NASAL SINUS SURGERY     THORACENTESIS Left 12/03/2022   Procedure: THORACENTESIS;  Surgeon: [name], DO;  Location: [facility];  Service: Pulmonary;  Laterality: Left;   THYROID SURGERY     VIDEO BRONCHOSCOPY WITH ENDOBRONCHIAL NAVIGATION Bilateral 06/10/2020   Procedure: VIDEO BRONCHOSCOPY WITH ENDOBRONCHIAL NAVIGATION;  Surgeon: [name], DO;  Location: [facility];  Service: Pulmonary;  Laterality: Bilateral;   VIDEO BRONCHOSCOPY WITH ENDOBRONCHIAL ULTRASOUND  06/10/2020  Procedure: VIDEO BRONCHOSCOPY WITH ENDOBRONCHIAL ULTRASOUND;  Surgeon: [name], DO;  Location: [facility];  Service: Pulmonary;;   Patient Active Problem List   Diagnosis Date Noted   Arthritis of right knee 06/20/2023   Corn of toe 06/20/2023   Mastodynia of left breast 02/13/2023   Pleural effusion 11/21/2022   Swelling of breast 11/17/2022   Dyspnea 11/14/2022   Cellulitis of left breast 10/17/2022   Iron deficiency anemia 10/11/2022   Acute respiratory failure with hypoxia (HCC) 10/05/2022   Bilateral pleural effusion 10/05/2022   History of stroke 10/05/2022   Weakness  10/05/2022   Edema 10/05/2022   Alzheimer's disease (HCC) 09/06/2022   Ex-smoker 09/06/2022   Hypothyroidism 09/06/2022   Right leg pain 07/13/2021   Dementia (HCC) 07/13/2021   RLQ abdominal pain 07/13/2021   Encounter for well adult exam with abnormal findings 06/10/2021   Anosmia 05/01/2021   Chronic ethmoidal sinusitis 02/14/2021   Debility 02/01/2021   Gait disorder 02/01/2021   Malignant neoplasm of bronchus and lung (HCC)    Port-A-Cath in place 11/02/2020   Adenocarcinoma of left lung, stage 3 (HCC) 06/22/2020   Lung mass    Pulmonary nodule 05/17/2020   Iron deficiency 05/03/2020   Swelling of lower extremity 07/30/2019   Ganglion cyst of right foot 01/16/2019   HLD (hyperlipidemia) 01/16/2019   LPRD (laryngopharyngeal reflux disease) 07/25/2018   Constipation 08/19/2017   Asthma, severe persistent, well-controlled 06/04/2017   Chronic pansinusitis 06/04/2017   Nasal polyposis 06/04/2017   Osteopenia 05/26/2017   GERD (gastroesophageal reflux disease)    H. pylori infection    Other allergic rhinitis 03/20/2016   Malnutrition of moderate degree 04/11/2015   HTN (hypertension) 04/10/2015    PCP: Oliver Barre, MD  REFERRING PROVIDER: Harriette Bouillon, MD  REFERRING DIAG: I89.0 (ICD-10-CM) - Lymphedema, not elsewhere classified  THERAPY DIAG:  Lymphedema, not elsewhere classified  Disorder of the skin and subcutaneous tissue related to radiation, unspecified  ONSET DATE: pt unable to give a date - "it has been a while"  Rationale for Evaluation and Treatment: Rehabilitation  SUBJECTIVE:                                                                                                                                                                                           SUBJECTIVE STATEMENT:  My sister and I would like her to do a few more weeks because it is helping.   PERTINENT HISTORY: history of non-small cell lung cancer with most recent CT 10/05/2022  demonstrating diffuse anasarca, as well as bilateral pleural effusions, hx of radiation to chest,  hx of breast  biopsy (benign), dementia and Alzheimer's   PAIN:  Are you having pain? Yes knee hurts, unable to rate, reports it is from arthritis and worse when walking  PRECAUTIONS: Other: chart states pt has Alzheimer's/dementia - pt unable to verify diagnosis in chart, no one else present with pt to assist  RED FLAGS: Unable to ask as pt has memory issues and no one present at appointment  WEIGHT BEARING RESTRICTIONS: No  FALLS:  Has patient fallen in last 6 months? No  LIVING ENVIRONMENT: Lives with: lives with their daughter Lives in: House/apartment Has following equipment at home: Dan Humphreys - 4 wheeled pt reports she does not use it  OCCUPATION: retired  LEISURE: pt reports she tries to walk  HAND DOMINANCE: right   PRIOR LEVEL OF FUNCTION: Independent does have memory issues  PATIENT GOALS: pt reports she does not know   OBJECTIVE: Note: Objective measures were completed at Evaluation unless otherwise noted.  COGNITION: Overall cognitive status: Within functional limits for tasks assessed   PALPATION: Fibrosis present in inferior L breast  OBSERVATIONS / OTHER ASSESSMENTS: L significantly larger than R breast, approximately double the size  POSTURE: forward head, rounded shoulders  UPPER EXTREMITY AROM/PROM: WFL  CERVICAL AROM: All within normal limits:    Percent limited  Flexion WFL  Extension 75% limited  Right lateral flexion WFL  Left lateral flexion 25% limited  Right rotation WFL  Left rotation WFL    LYMPHEDEMA ASSESSMENTS:   LANDMARK RIGHT  eval  At axilla  28.5  15 cm proximal to olecranon process 25.6  10 cm proximal to olecranon process 25.5  Olecranon process 22.5  15 cm proximal to ulnar styloid process 22.1  10 cm proximal to ulnar styloid process 18.5  Just proximal to ulnar styloid process 14.5  Across hand at thumb web space 18.8   At base of 2nd digit 6  (Blank rows = not tested)  LANDMARK LEFT  eval  At axilla  28.5  15 cm proximal to olecranon process 26.5  10 cm proximal to olecranon process 25.9  Olecranon process 24  15 cm proximal to ulnar styloid process 21.5  10 cm proximal to ulnar styloid process 18.1  Just proximal to ulnar styloid process 14.5  Across hand at thumb web space 18.2  At base of 2nd digit 5.6  (Blank rows = not tested)  LLIS:  Flowsheet Row Outpatient Rehab from 03/20/2023 in [facility]  Lymphedema Life Impact Scale Total Score 14.71 %      14.71   BREAST COMPLAINTS QUESTIONNAIRE Pain:  0 Heaviness: 5 Swollen feeling:0 Tense Skin: 0 Redness: 0 Bra Print: 0 Size of Pores: 0 Hard feeling:  3 Total:    8 /80 A Score over 9 indicates lymphedema issues in the breast   TODAY'S TREATMENT:  DATE:  06/24/23 Left breast still swollen with increased fibrosis left medial inferior breast, and enlarged pores, and thickened skin but improving In supine: Short neck, 5 diaphragmatic breaths, bil axillary nodes and establishment of interaxillary pathway, L inguinal nodes and establishment of axilloinguinal pathway, then L breast moving fluid towards pathways spending extra time in any areas of fibrosis then retracing all steps. Extra time spent at medial breast.   06/17/2023 With pt permission;daughter did not come in Left breast still swollen with increased fibrosis left medial inferior breast, and enlarged pores, and thickened skin but improving In supine: Short neck, 5 diaphragmatic breaths, bil axillary nodes and establishment of interaxillary pathway, L inguinal nodes and establishment of axilloinguinal pathway, then L breast moving fluid towards pathways spending extra time in any areas of fibrosis then retracing all steps.  Extra time spent at medial breast. Had pt practice anterior interaxillary pathway and left medial breast but required multiple VC's  06/06/23 In supine: Short neck, 5 diaphragmatic breaths, bil axillary nodes and establishment of interaxillary pathway, L inguinal nodes and establishment of axilloinguinal pathway, then L breast moving fluid towards pathways spending extra time in any areas of fibrosis then retracing all steps. Daughter was present.   PATIENT EDUCATION:  Education details: lymphedema and how it is managed with manual lymphatic drainage and compression bra and swell spot or foam; began instruction of self MLD Person educated: Patient, daughter Education method: Explanation and Handouts, demonstration and verbal and tactile cues Education comprehension: verbalized understanding, will benefit from further review  HOME EXERCISE PROGRAM: Obtain compression bra  ASSESSMENT:  CLINICAL IMPRESSION: Continued POC for breast lymphedema.  Pt is unable to do at home due to dementia family unable to assist.  Pt does have improvement in the breast status post MT but it seems to return after each visit.  Discussed POC with daughter and we will do 4-5 more weeks - asking for 5 for insurance.    Medial breast fibrosis improved after MLD today. Pt is doing MLD daily but did need VC's and TC's to do properly today. Pt has appt today in [place] to get fit for another compression bra and breast form.  OBJECTIVE IMPAIRMENTS: decreased knowledge of condition, decreased knowledge of use of DME, increased edema, and postural dysfunction.   ACTIVITY LIMITATIONS:  none  PARTICIPATION LIMITATIONS:  none  PERSONAL FACTORS: Time since onset of injury/illness/exacerbation and 1 comorbidity: dementia  are also affecting patient's functional outcome.   REHAB POTENTIAL: Good  CLINICAL DECISION MAKING: Stable/uncomplicated  EVALUATION COMPLEXITY: Low  GOALS: Goals reviewed with patient? Yes  SHORT  TERM GOALS=LONG TERM GOALS Target date: 04/17/23  Pt will obtain a compression bra for long term management of L breast lymphedema.  Baseline: Goal status: MET 04/11/23   2.  Pt's daughters will be independent in breast MLD for long term management of lymphedema.  Baseline:  Goal status: MET/DEFERRED  3.  Pt will demonstrate a 50% improvement in fibrosis in L breast to decrease risk of infection.  Baseline:  Goal status: ONGOING  PLAN:  PT FREQUENCY: 1 visit per week   PT DURATION: 5 weeks   PLANNED INTERVENTIONS: 97140- Manual therapy, [id number]- Orthotic Fit/training, Patient/Family education, Therapeutic exercises, Therapeutic activity, and Self Care  PLAN FOR NEXT SESSION: Cont MLD to L breast - daughters will not do at home. Check compression bra she is getting today,   Idamae Lusher, PT 06/24/2023, 3:45 PM  Self manual lymph drainage: Perform this sequence once a day.  Only give enough pressure to your skin to make the skin move.  Diaphragmatic - Supine   Inhale through nose making navel move out toward hands. Exhale through puckered lips, hands follow navel in. Repeat _5__ times. Rest _10__ seconds between repeats.   Copyright  VHI. All rights reserved.  Hug yourself.  Do circles at your neck just above your collarbones.  Repeat this 10 times.  Axilla - One at a Time   Using full weight of flat hand and fingers at center of uninvolved armpit, make _10__ in-place circles.   Copyright  VHI. All rights reserved.  LEG: Inguinal Nodes Stimulation   With small finger side of hand against hip crease on involved side, gently perform circles at the crease. Repeat __10_ times.   Copyright  VHI. All rights reserved.  Axilla to Inguinal Nodes - Sweep   On involved side, pump _4__ times from armpit along side of trunk to hip crease.  Now gently stretch skin from the involved side to the uninvolved side across the chest at the shoulder line.  Repeat that 4  times.  Draw an imaginary diagonal line from upper outer breast through the nipple area toward lower inner breast.  Direct fluid upward and inward from this line toward the pathway across your upper chest .  Do this in three rows to treat all of the upper inner breast tissue, and do each row 3-4x.      Direct fluid to treat all of lower outer breast tissue downward and outward toward      pathway that is aimed at the left groin.  Finish by doing the pathways as described above going from your involved armpit to the same side groin and going across your upper chest from the involved shoulder to the uninvolved shoulder.  Repeat the steps above where you do circles in your left groin and right armpit. Copyright  VHI. All rights reserved.    Cancer Rehab [id number]

## 2023-07-03 ENCOUNTER — Ambulatory Visit: Payer: Medicare Other | Admitting: Rehabilitation

## 2023-07-03 ENCOUNTER — Inpatient Hospital Stay: Payer: Medicare Other | Attending: Internal Medicine

## 2023-07-03 ENCOUNTER — Telehealth: Payer: Self-pay | Admitting: Internal Medicine

## 2023-07-03 DIAGNOSIS — R040 Epistaxis: Secondary | ICD-10-CM | POA: Diagnosis not present

## 2023-07-03 DIAGNOSIS — Z923 Personal history of irradiation: Secondary | ICD-10-CM | POA: Insufficient documentation

## 2023-07-03 DIAGNOSIS — Z85118 Personal history of other malignant neoplasm of bronchus and lung: Secondary | ICD-10-CM | POA: Insufficient documentation

## 2023-07-03 DIAGNOSIS — Z9221 Personal history of antineoplastic chemotherapy: Secondary | ICD-10-CM | POA: Diagnosis not present

## 2023-07-03 DIAGNOSIS — C3490 Malignant neoplasm of unspecified part of unspecified bronchus or lung: Secondary | ICD-10-CM

## 2023-07-03 LAB — CBC WITH DIFFERENTIAL (CANCER CENTER ONLY)
Abs Immature Granulocytes: 0.01 10*3/uL (ref 0.00–0.07)
Basophils Absolute: 0 10*3/uL (ref 0.0–0.1)
Basophils Relative: 1 %
Eosinophils Absolute: 0 10*3/uL (ref 0.0–0.5)
Eosinophils Relative: 0 %
HCT: 38.7 % (ref 36.0–46.0)
Hemoglobin: 12.8 g/dL (ref 12.0–15.0)
Immature Granulocytes: 0 %
Lymphocytes Relative: 36 %
Lymphs Abs: 1.7 10*3/uL (ref 0.7–4.0)
MCH: 30.4 pg (ref 26.0–34.0)
MCHC: 33.1 g/dL (ref 30.0–36.0)
MCV: 91.9 fL (ref 80.0–100.0)
Monocytes Absolute: 0.5 10*3/uL (ref 0.1–1.0)
Monocytes Relative: 12 %
Neutro Abs: 2.4 10*3/uL (ref 1.7–7.7)
Neutrophils Relative %: 51 %
Platelet Count: 183 10*3/uL (ref 150–400)
RBC: 4.21 MIL/uL (ref 3.87–5.11)
RDW: 14.1 % (ref 11.5–15.5)
WBC Count: 4.7 10*3/uL (ref 4.0–10.5)
nRBC: 0 % (ref 0.0–0.2)

## 2023-07-03 LAB — CMP (CANCER CENTER ONLY)
ALT: 10 U/L (ref 0–44)
AST: 20 U/L (ref 15–41)
Albumin: 3.9 g/dL (ref 3.5–5.0)
Alkaline Phosphatase: 76 U/L (ref 38–126)
Anion gap: 4 — ABNORMAL LOW (ref 5–15)
BUN: 14 mg/dL (ref 8–23)
CO2: 30 mmol/L (ref 22–32)
Calcium: 9 mg/dL (ref 8.9–10.3)
Chloride: 107 mmol/L (ref 98–111)
Creatinine: 0.84 mg/dL (ref 0.44–1.00)
GFR, Estimated: >60 mL/min (ref >60)
Glucose, Bld: 90 mg/dL (ref 70–99)
Potassium: 3.6 mmol/L (ref 3.5–5.1)
Sodium: 141 mmol/L (ref 135–145)
Total Bilirubin: 0.4 mg/dL (ref 0.0–1.2)
Total Protein: 7.2 g/dL (ref 6.5–8.1)

## 2023-07-03 NOTE — Telephone Encounter (Signed)
Rescheduled LAB appointment due to transportation issues. Patients daughter called to move appointment up a day.

## 2023-07-04 ENCOUNTER — Other Ambulatory Visit: Payer: Medicare Other

## 2023-07-11 ENCOUNTER — Inpatient Hospital Stay (HOSPITAL_BASED_OUTPATIENT_CLINIC_OR_DEPARTMENT_OTHER): Payer: Medicare Other | Admitting: Internal Medicine

## 2023-07-11 DIAGNOSIS — C3490 Malignant neoplasm of unspecified part of unspecified bronchus or lung: Secondary | ICD-10-CM | POA: Diagnosis not present

## 2023-07-11 DIAGNOSIS — C3492 Malignant neoplasm of unspecified part of left bronchus or lung: Secondary | ICD-10-CM

## 2023-07-11 NOTE — Progress Notes (Signed)
 [facility] [phone]   [phone]  PROGRESS NOTE FOR TELEMEDICINE VISITS  [name], MD [address] [place] [address]  I connected withNAME@ on 07/11/23 at  2:00 PM EST by video enabled telemedicine visit and verified that I am speaking with the correct person using two identifiers.   I discussed the limitations, risks, security and privacy concerns of performing an evaluation and management service by telemedicine and the availability of in-person appointments. I also discussed with the patient that there may be a patient responsible charge related to this service. The patient expressed understanding and agreed to proceed.  Other persons participating in the visit and their role in the encounter:  daughter  Patient's location:  Home Provider's location: [facility]  DIAGNOSIS: Stage IIIB (T2 a, N3, M0) non-small cell lung cancer, adenocarcinoma presented with left lower lobe lung mass in addition to left hilar and mediastinal as well as right and left cervical lymphadenopathy diagnosed in January 2022   PRIOR THERAPY:  1) Concurrent chemoradiation with weekly carboplatin for AUC of 2 and paclitaxel 45 mg/M2.  Status post 5 cycles.  Last dose was given August 01, 2020. 2) Consolidation treatment with immunotherapy with Imfinzi 1500 mg IV every 4 weeks.  First dose September 06, 2020.  Status post 3 cycles.  This treatment was discontinued secondary to intolerance and hospital admission with sepsis.   CURRENT THERAPY: Observation.  INTERVAL HISTORY: Angelica [name] 83 y.o. female has a MyChart virtual video visit with me today.  Her daughter was available during the visit.Discussed the use of AI scribe software for clinical note transcription with the patient, who gave verbal consent to proceed.  History of Present Illness   Angelica [name] is an 83 year old female with adenocarcinoma who presents for routine  follow-up.  She has a history of adenocarcinoma diagnosed in 2022, for which she underwent chemotherapy and radiation therapy. She was subsequently started on consolidation treatment with immunotherapy using Imfinzi. However, after three cycles, the treatment was discontinued due to the development of pneumonitis. Since then, she has been under observation.  A chest scan performed approximately two weeks ago showed no evidence of tumor recurrence or metastasis, with only scarring from previous radiation noted.  Her breathing is currently stable with no chest pain or cough. No nausea, vomiting, or diarrhea.  In the past six months, she reports no significant changes in her condition. She experienced epistaxis for the first time this morning, which occurred after blowing her nose and was associated with dryness and minor trauma. She attributes this to the dry heat and cold weather. No recurrent epistaxis.       MEDICAL HISTORY: Past Medical History:  Diagnosis Date   Arthritis    Asthma    Bowel obstruction (HCC)    Cancer (HCC)    Environmental allergies    GERD (gastroesophageal reflux disease)    Glaucoma 05/09/2017   H. pylori infection    History of radiation therapy 07/05/20-08/05/20   Left lung IMRT Dr. Roselind Messier    HLD (hyperlipidemia) 01/16/2019   Hypertension    Iron deficiency anemia    Osteopenia 05/26/2017   Thyroid disease    Urticaria 07/25/2018    ALLERGIES:  is allergic to asa buff (mag [buffered aspirin], ensure [nutritional supplements], fish allergy, other, peanut-containing drug products, penicillins, shellfish-derived products, and strawberry extract.  MEDICATIONS:  Current Outpatient Medications  Medication Sig Dispense Refill   acetaminophen (TYLENOL) 500  MG tablet Take 1,000 mg by mouth every 6 (six) hours as needed for moderate pain.     albuterol (VENTOLIN HFA) 108 (90 Base) MCG/ACT inhaler Inhale 2 puffs into the lungs every 4 (four) hours as needed for  wheezing or shortness of breath. 18 g 1   alum & mag hydroxide-simeth (MAALOX/MYLANTA) 200-200-20 MG/5ML suspension Take 15 mLs by mouth every 6 (six) hours as needed for indigestion or heartburn.     amLODipine (NORVASC) 10 MG tablet Take 1 tablet (10 mg total) by mouth daily. 90 tablet 3   clopidogrel (PLAVIX) 75 MG tablet Take 1 tablet by mouth once daily 90 tablet 3   diclofenac Sodium (VOLTAREN) 1 % GEL Apply 4 g topically 4 (four) times daily. (Patient taking differently: Apply 4 g topically 4 (four) times daily as needed (pain).) 100 g 0   EPINEPHrine (EPIPEN 2-PAK) 0.3 mg/0.3 mL IJ SOAJ injection Use as directed for severe allergic reaction 2 each 1   EQ ALLERGY RELIEF, CETIRIZINE, 10 MG tablet Take 1 tablet by mouth once daily 90 tablet 0   fluticasone (FLONASE) 50 MCG/ACT nasal spray USE 1 SPRAY(S) IN EACH NOSTRIL IN THE MORNING AND AT BEDTIME 16 g 0   furosemide (LASIX) 40 MG tablet Take 1 tablet (40 mg total) by mouth daily. (Patient taking differently: Take 40 mg by mouth daily as needed for edema.) 30 tablet 11   ipratropium (ATROVENT) 0.06 % nasal spray Place 2 sprays into both nostrils 4 (four) times daily as needed (nasal drainage). (Patient taking differently: Place 2 sprays into both nostrils daily.) 15 mL 5   levothyroxine (SYNTHROID) 50 MCG tablet TAKE 1 TABLET BY MOUTH ONCE DAILY BEFORE BREAKFAST 90 tablet 3   Magnesium Hydroxide (PHILLIPS MILK OF MAGNESIA PO) Take 15-30 mLs by mouth daily as needed (for constipation).     memantine (NAMENDA) 10 MG tablet Take 1 tablet by mouth twice daily 60 tablet 3   montelukast (SINGULAIR) 10 MG tablet TAKE 1 TABLET BY MOUTH AT BEDTIME 30 tablet 5   ondansetron (ZOFRAN-ODT) 4 MG disintegrating tablet Take 1 tablet (4 mg total) by mouth every 8 (eight) hours as needed for nausea or vomiting. 10 tablet 0   pantoprazole (PROTONIX) 40 MG tablet Take 1 tablet by mouth once daily 90 tablet 2   potassium chloride SA (KLOR-CON M) 20 MEQ tablet Take  2 tablets (40 mEq total) by mouth daily as needed. Take potasium days you take lasix. 60 tablet 5   pravastatin (PRAVACHOL) 80 MG tablet Take 1 tablet by mouth once daily 90 tablet 3   SYMBICORT 80-4.5 MCG/ACT inhaler INHALE 2 PUFFS BY MOUTH IN THE MORNING AND AT BEDTIME (Patient taking differently: Inhale into the lungs See admin instructions. Inhale 2 puffs once a day, may take a second dose of 2 puffs when needed for shortness of breath) 10.2 g 5   Current Facility-Administered Medications  Medication Dose Route Frequency Provider Last Rate Last Admin   Benralizumab SOSY 30 mg  30 mg Subcutaneous Q8 Weeks Marcelyn Bruins, MD   30 mg at 05/29/23 1007    SURGICAL HISTORY:  Past Surgical History:  Procedure Laterality Date   ABDOMINAL HYSTERECTOMY     BOWEL RESECTION N/A 05/23/2020   Procedure: SMALL BOWEL REPAIR;  Surgeon: Sheliah Hatch De Blanch, MD;  Location: [facility] OR;  Service: General;  Laterality: N/A;   BREAST BIOPSY     BRONCHIAL BIOPSY  06/10/2020   Procedure: BRONCHIAL BIOPSIES;  Surgeon: Audie Box  L, DO;  Location: [facility] ENDOSCOPY;  Service: Pulmonary;;   BRONCHIAL BRUSHINGS  06/10/2020   Procedure: BRONCHIAL BRUSHINGS;  Surgeon: [name], DO;  Location: [facility];  Service: Pulmonary;;   BRONCHIAL NEEDLE ASPIRATION BIOPSY  06/10/2020   Procedure: BRONCHIAL NEEDLE ASPIRATION BIOPSIES;  Surgeon: [name], DO;  Location: [facility];  Service: Pulmonary;;   BRONCHIAL WASHINGS  06/10/2020   Procedure: BRONCHIAL WASHINGS;  Surgeon: [name], DO;  Location: [facility];  Service: Pulmonary;;   COLON SURGERY     DIAGNOSTIC LARYNGOSCOPY N/A 05/23/2020   Procedure: ATTEMPTED DIAGNOSTIC LAPAROSCOPY WITH ADHESIONS;  Surgeon: Sheliah Hatch, De Blanch, MD;  Location: [facility] OR;  Service: General;  Laterality: N/A;   IR IMAGING GUIDED PORT INSERTION  07/15/2020   IR REMOVAL TUN ACCESS W/ PORT W/O FL MOD SED  01/16/2023   KNEE SURGERY     LAPAROTOMY N/A 04/18/2015    Procedure: Exploratory laparotomy with lysis of adhesions, possible bowel resection;  Surgeon: Axel Filler, MD;  Location: [facility] OR;  Service: General;  Laterality: N/A;   LAPAROTOMY N/A 05/23/2020   Procedure: EXPLORATORY LAPAROTOMY;  Surgeon: Rodman Pickle, MD;  Location: [facility] OR;  Service: General;  Laterality: N/A;   LYSIS OF ADHESION N/A 05/23/2020   Procedure: LYSIS OF ADHESION;  Surgeon: Sheliah Hatch De Blanch, MD;  Location: [facility] OR;  Service: General;  Laterality: N/A;   NASAL SINUS SURGERY     THORACENTESIS Left 12/03/2022   Procedure: THORACENTESIS;  Surgeon: [name], DO;  Location: [facility];  Service: Pulmonary;  Laterality: Left;   THYROID SURGERY     VIDEO BRONCHOSCOPY WITH ENDOBRONCHIAL NAVIGATION Bilateral 06/10/2020   Procedure: VIDEO BRONCHOSCOPY WITH ENDOBRONCHIAL NAVIGATION;  Surgeon: [name], DO;  Location: [facility];  Service: Pulmonary;  Laterality: Bilateral;   VIDEO BRONCHOSCOPY WITH ENDOBRONCHIAL ULTRASOUND  06/10/2020   Procedure: VIDEO BRONCHOSCOPY WITH ENDOBRONCHIAL ULTRASOUND;  Surgeon: [name], DO;  Location: [facility];  Service: Pulmonary;;    REVIEW OF SYSTEMS:  A comprehensive review of systems was negative except for: Ears, nose, mouth, throat, and face: positive for epistaxis Respiratory: positive for dyspnea on exertion     LABORATORY DATA: Lab Results  Component Value Date   WBC 4.7 07/03/2023   HGB 12.8 07/03/2023   HCT 38.7 07/03/2023   MCV 91.9 07/03/2023   PLT 183 07/03/2023      Chemistry      Component Value Date/Time   NA 141 07/03/2023 1526   K 3.6 07/03/2023 1526   CL 107 07/03/2023 1526   CO2 30 07/03/2023 1526   BUN 14 07/03/2023 1526   CREATININE 0.84 07/03/2023 1526   CREATININE 0.86 11/02/2022 1604      Component Value Date/Time   CALCIUM 9.0 07/03/2023 1526   ALKPHOS 76 07/03/2023 1526   AST 20 07/03/2023 1526   ALT 10 07/03/2023 1526   BILITOT 0.4 07/03/2023 1526        RADIOGRAPHIC STUDIES: CT Chest W Contrast Result Date: 07/01/2023 CLINICAL DATA:  Non-small cell lung cancer (NSCLC), staging. * Tracking Code: BO * EXAM: CT CHEST WITH CONTRAST TECHNIQUE: Multidetector CT imaging of the chest was performed during intravenous contrast administration. RADIATION DOSE REDUCTION: This exam was performed according to the departmental dose-optimization program which includes automated exposure control, adjustment of the mA and/or kV according to patient size and/or use of iterative reconstruction technique. CONTRAST:  75mL ISOVUE-300 IOPAMIDOL (ISOVUE-300) INJECTION 61% COMPARISON:  02/26/2023 PET-CT.  01/03/2023 chest CT. FINDINGS:  Cardiovascular: Normal heart size. No significant pericardial effusion/thickening. Three-vessel coronary atherosclerosis. Atherosclerotic nonaneurysmal thoracic aorta. Normal caliber pulmonary arteries. No central pulmonary emboli. Mediastinum/Nodes: Mild multinodular goiter with dominant heterogeneous density 2.2 cm posterior left thyroid nodule. In the setting of significant comorbidities or limited life expectancy, no follow-up recommended (ref: J Am Coll Radiol. 2015 Feb;12(2): 143-50). Unremarkable esophagus. No pathologically enlarged axillary, mediastinal or hilar lymph nodes. Lungs/Pleura: No pneumothorax. Trace layering bilateral pleural effusions, left greater than right, decreased bilaterally. Stable sharply marginated masslike fibrosis in the infrahilar left lower lobe. No acute consolidative airspace disease or new significant pulmonary nodules. Mild centrilobular and paraseptal emphysema. Upper abdomen: No acute abnormality. Musculoskeletal: No aggressive appearing focal osseous lesions. Moderate thoracic spondylosis. Prominent asymmetric left breast skin thickening. IMPRESSION: 1. Stable left infrahilar masslike fibrosis, without evidence of local tumor recurrence. 2. No evidence of metastatic disease in the chest. 3. Trace layering  bilateral pleural effusions, left greater than right, decreased bilaterally. 4. Three-vessel coronary atherosclerosis. 5. Prominent asymmetric left breast skin thickening, nonspecific. Please see the 05/13/2023 screening mammogram report. 6. Aortic Atherosclerosis (ICD10-I70.0) and Emphysema (ICD10-J43.9). Electronically Signed   By: Delbert Phenix M.D.   On: 07/01/2023 23:22    ASSESSMENT AND PLAN: This is a very pleasant 83 years old African-American female recently diagnosed with a stage IIIb (T2a, N3, M0) non-small cell lung cancer, adenocarcinoma presented with left lower lobe lung mass in addition to left hilar and mediastinal lymphadenopathy as well as right and left cervical lymphadenopathy diagnosed in January 2022. The patient underwent a course of concurrent chemoradiation with weekly carboplatin and paclitaxel status post 5 cycles with partial response.   The patient underwent consolidation treatment with Imfinzi 1500 Mg IV every 4 weeks status post 3 cycles.  She has been tolerating this treatment well but during the last infusion she developed systemic inflammatory response syndrome with fever and blood work showed bacteremia secondary to Klebsiella pneumonia treated with antibiotic during her hospitalization.  She is currently on observation. The patient is doing fine today with no concerning complaints except for 1 episode of epistaxis earlier today secondary to dryness of her nose and minor trauma. She had repeat CT scan of the chest performed recently.  I personally and independently reviewed the scan and discussed the results with the patient and her daughter.  The scan showed no concerning findings for disease recurrence or metastasis.  Adenocarcinoma of the lung, stage IIIB. 83 year old female with adenocarcinoma of the lung, initially treated with chemotherapy and radiation, followed by consolidation with Imfinzi, discontinued after three cycles due to pneumonitis. Recent chest scan  (two weeks ago) shows no recurrence or metastasis, only radiation scarring. Blood counts and chemistry are normal. Discussed continued monitoring and follow-up scan in six months. Advised to report any concerning symptoms. - Order chest scan in six months - Advise to report any concerning symptoms  Epistaxis Experienced epistaxis this morning, likely due to dryness and trauma from nose blowing and digging. First occurrence, not frequent. Discussed potential causes related to dry heat and cold weather. Advised on measures to maintain nasal moisture. - Recommend use of a humidifier at home.  She was advised to call immediately if she has any concerning symptoms in the interval. I discussed the assessment and treatment plan with the patient. The patient was provided an opportunity to ask questions and all were answered. The patient agreed with the plan and demonstrated an understanding of the instructions.   The patient was advised to call back or  seek an in-person evaluation if the symptoms worsen or if the condition fails to improve as anticipated.  I provided 20 minutes of face-to-face video visit time during this encounter, and > 50% was spent counseling as documented under my assessment & plan.  Lajuana Matte, MD 07/11/2023 1:10 PM  Disclaimer: This note was dictated with voice recognition software. Similar sounding words can inadvertently be transcribed and may not be corrected upon review.

## 2023-07-15 ENCOUNTER — Other Ambulatory Visit: Payer: Self-pay | Admitting: *Deleted

## 2023-07-15 ENCOUNTER — Encounter: Payer: Self-pay | Admitting: Physician Assistant

## 2023-07-15 ENCOUNTER — Other Ambulatory Visit (HOSPITAL_COMMUNITY): Payer: Self-pay

## 2023-07-15 MED ORDER — FASENRA 30 MG/ML ~~LOC~~ SOSY: 30.0000 mg | PREFILLED_SYRINGE | SUBCUTANEOUS | 6 refills | Status: AC

## 2023-07-16 ENCOUNTER — Encounter: Payer: Self-pay | Admitting: Rehabilitation

## 2023-07-16 ENCOUNTER — Ambulatory Visit: Payer: Medicare Other | Admitting: Rehabilitation

## 2023-07-16 DIAGNOSIS — L599 Disorder of the skin and subcutaneous tissue related to radiation, unspecified: Secondary | ICD-10-CM

## 2023-07-16 DIAGNOSIS — I890 Lymphedema, not elsewhere classified: Secondary | ICD-10-CM

## 2023-07-16 NOTE — Therapy (Signed)
 OUTPATIENT PHYSICAL THERAPY  UPPER EXTREMITY ONCOLOGY TREATMENT     Patient Name: Angelica [name] MRN: [id number] DOB:[birth year], 83 y.o., female Today's Date: 07/16/2023  END OF SESSION:  PT End of Session - 07/16/23 1200     Visit Number 13    Number of Visits 17    Date for PT Re-Evaluation 07/29/23    Authorization Type 5 visits- 07/01/2023-08/05/2023    Authorization - Visit Number 1    Authorization - Number of Visits 5    PT Start Time 1203    PT Stop Time 1245    PT Time Calculation (min) 42 min    Activity Tolerance Patient tolerated treatment well    Behavior During Therapy [facility] for tasks assessed/performed                 Past Medical History:  Diagnosis Date   Arthritis    Asthma    Bowel obstruction (HCC)    Cancer (HCC)    Environmental allergies    GERD (gastroesophageal reflux disease)    Glaucoma 05/09/2017   H. pylori infection    History of radiation therapy 07/05/20-08/05/20   Left lung IMRT Dr. Roselind Messier    HLD (hyperlipidemia) 01/16/2019   Hypertension    Iron deficiency anemia    Osteopenia 05/26/2017   Thyroid disease    Urticaria 07/25/2018   Past Surgical History:  Procedure Laterality Date   ABDOMINAL HYSTERECTOMY     BOWEL RESECTION N/A 05/23/2020   Procedure: SMALL BOWEL REPAIR;  Surgeon: Sheliah Hatch De Blanch, MD;  Location: [facility] OR;  Service: General;  Laterality: N/A;   BREAST BIOPSY     BRONCHIAL BIOPSY  06/10/2020   Procedure: BRONCHIAL BIOPSIES;  Surgeon: [name], DO;  Location: [facility];  Service: Pulmonary;;   BRONCHIAL BRUSHINGS  06/10/2020   Procedure: BRONCHIAL BRUSHINGS;  Surgeon: [name], DO;  Location: [facility];  Service: Pulmonary;;   BRONCHIAL NEEDLE ASPIRATION BIOPSY  06/10/2020   Procedure: BRONCHIAL NEEDLE ASPIRATION BIOPSIES;  Surgeon: [name], DO;  Location: [facility];  Service: Pulmonary;;   BRONCHIAL WASHINGS  06/10/2020   Procedure: BRONCHIAL WASHINGS;  Surgeon: [name],  DO;  Location: [facility];  Service: Pulmonary;;   COLON SURGERY     DIAGNOSTIC LARYNGOSCOPY N/A 05/23/2020   Procedure: ATTEMPTED DIAGNOSTIC LAPAROSCOPY WITH ADHESIONS;  Surgeon: Sheliah Hatch, De Blanch, MD;  Location: [facility] OR;  Service: General;  Laterality: N/A;   IR IMAGING GUIDED PORT INSERTION  07/15/2020   IR REMOVAL TUN ACCESS W/ PORT W/O FL MOD SED  01/16/2023   KNEE SURGERY     LAPAROTOMY N/A 04/18/2015   Procedure: Exploratory laparotomy with lysis of adhesions, possible bowel resection;  Surgeon: Axel Filler, MD;  Location: [facility];  Service: General;  Laterality: N/A;   LAPAROTOMY N/A 05/23/2020   Procedure: EXPLORATORY LAPAROTOMY;  Surgeon: Rodman Pickle, MD;  Location: [facility];  Service: General;  Laterality: N/A;   LYSIS OF ADHESION N/A 05/23/2020   Procedure: LYSIS OF ADHESION;  Surgeon: Sheliah Hatch De Blanch, MD;  Location: [facility];  Service: General;  Laterality: N/A;   NASAL SINUS SURGERY     THORACENTESIS Left 12/03/2022   Procedure: THORACENTESIS;  Surgeon: [name], DO;  Location: [facility];  Service: Pulmonary;  Laterality: Left;   THYROID SURGERY     VIDEO BRONCHOSCOPY WITH ENDOBRONCHIAL NAVIGATION Bilateral 06/10/2020   Procedure: VIDEO BRONCHOSCOPY WITH ENDOBRONCHIAL NAVIGATION;  Surgeon: [name], DO;  Location: [facility];  Service: Pulmonary;  Laterality: Bilateral;   VIDEO BRONCHOSCOPY WITH ENDOBRONCHIAL ULTRASOUND  06/10/2020   Procedure: VIDEO BRONCHOSCOPY WITH ENDOBRONCHIAL ULTRASOUND;  Surgeon: [name], DO;  Location: [facility];  Service: Pulmonary;;   Patient Active Problem List   Diagnosis Date Noted   Arthritis of right knee 06/20/2023   Corn of toe 06/20/2023   Mastodynia of left breast 02/13/2023   Pleural effusion 11/21/2022   Swelling of breast 11/17/2022   Dyspnea 11/14/2022   Cellulitis of left breast 10/17/2022   Iron deficiency anemia 10/11/2022   Acute respiratory failure with hypoxia (HCC) 10/05/2022    Bilateral pleural effusion 10/05/2022   History of stroke 10/05/2022   Weakness 10/05/2022   Edema 10/05/2022   Alzheimer's disease (HCC) 09/06/2022   Ex-smoker 09/06/2022   Hypothyroidism 09/06/2022   Right leg pain 07/13/2021   Dementia (HCC) 07/13/2021   RLQ abdominal pain 07/13/2021   Encounter for well adult exam with abnormal findings 06/10/2021   Anosmia 05/01/2021   Chronic ethmoidal sinusitis 02/14/2021   Debility 02/01/2021   Gait disorder 02/01/2021   Port-A-Cath in place 11/02/2020   Adenocarcinoma of left lung, stage 3 (HCC) 06/22/2020   Lung mass    Pulmonary nodule 05/17/2020   Iron deficiency 05/03/2020   Swelling of lower extremity 07/30/2019   Ganglion cyst of right foot 01/16/2019   HLD (hyperlipidemia) 01/16/2019   LPRD (laryngopharyngeal reflux disease) 07/25/2018   Constipation 08/19/2017   Asthma, severe persistent, well-controlled 06/04/2017   Chronic pansinusitis 06/04/2017   Nasal polyposis 06/04/2017   Osteopenia 05/26/2017   GERD (gastroesophageal reflux disease)    H. pylori infection    Other allergic rhinitis 03/20/2016   Malnutrition of moderate degree 04/11/2015   HTN (hypertension) 04/10/2015    PCP: Oliver Barre, MD  REFERRING PROVIDER: Harriette Bouillon, MD  REFERRING DIAG: I89.0 (ICD-10-CM) - Lymphedema, not elsewhere classified  THERAPY DIAG:  Lymphedema, not elsewhere classified  Disorder of the skin and subcutaneous tissue related to radiation, unspecified  ONSET DATE: pt unable to give a date - "it has been a while"  Rationale for Evaluation and Treatment: Rehabilitation  SUBJECTIVE:                                                                                                                                                                                           SUBJECTIVE STATEMENT:  My sister and I would like her to do a few more weeks because it is helping.   PERTINENT HISTORY: history of non-small cell lung cancer  with most recent CT 10/05/2022 demonstrating diffuse anasarca, as well as bilateral pleural effusions, hx of radiation  to chest,  hx of breast biopsy (benign), dementia and Alzheimer's   PAIN:  Are you having pain? Yes knee hurts, unable to rate, reports it is from arthritis and worse when walking  PRECAUTIONS: Other: chart states pt has Alzheimer's/dementia - pt unable to verify diagnosis in chart, no one else present with pt to assist  RED FLAGS: Unable to ask as pt has memory issues and no one present at appointment  WEIGHT BEARING RESTRICTIONS: No  FALLS:  Has patient fallen in last 6 months? No  LIVING ENVIRONMENT: Lives with: lives with their daughter Lives in: House/apartment Has following equipment at home: Dan Humphreys - 4 wheeled pt reports she does not use it  OCCUPATION: retired  LEISURE: pt reports she tries to walk  HAND DOMINANCE: right   PRIOR LEVEL OF FUNCTION: Independent does have memory issues  PATIENT GOALS: pt reports she does not know   OBJECTIVE: Note: Objective measures were completed at Evaluation unless otherwise noted.  COGNITION: Overall cognitive status: Within functional limits for tasks assessed   PALPATION: Fibrosis present in inferior L breast  OBSERVATIONS / OTHER ASSESSMENTS: L significantly larger than R breast, approximately double the size  POSTURE: forward head, rounded shoulders  UPPER EXTREMITY AROM/PROM: WFL  CERVICAL AROM: All within normal limits:    Percent limited  Flexion WFL  Extension 75% limited  Right lateral flexion WFL  Left lateral flexion 25% limited  Right rotation WFL  Left rotation WFL    LYMPHEDEMA ASSESSMENTS:   LANDMARK RIGHT  eval  At axilla  28.5  15 cm proximal to olecranon process 25.6  10 cm proximal to olecranon process 25.5  Olecranon process 22.5  15 cm proximal to ulnar styloid process 22.1  10 cm proximal to ulnar styloid process 18.5  Just proximal to ulnar styloid process 14.5   Across hand at thumb web space 18.8  At base of 2nd digit 6  (Blank rows = not tested)  LANDMARK LEFT  eval  At axilla  28.5  15 cm proximal to olecranon process 26.5  10 cm proximal to olecranon process 25.9  Olecranon process 24  15 cm proximal to ulnar styloid process 21.5  10 cm proximal to ulnar styloid process 18.1  Just proximal to ulnar styloid process 14.5  Across hand at thumb web space 18.2  At base of 2nd digit 5.6  (Blank rows = not tested)  LLIS:  Flowsheet Row Outpatient Rehab from 03/20/2023 in [facility]  Lymphedema Life Impact Scale Total Score 14.71 %      14.71   BREAST COMPLAINTS QUESTIONNAIRE Pain:  0 Heaviness: 5 Swollen feeling:0 Tense Skin: 0 Redness: 0 Bra Print: 0 Size of Pores: 0 Hard feeling:  3 Total:    8 /80 A Score over 9 indicates lymphedema issues in the breast   TODAY'S TREATMENT:  DATE:  07/16/23 Left breast still swollen with increased fibrosis left medial inferior breast, and enlarged pores, and thickened skin but improving In supine: Short neck, 5 diaphragmatic breaths, bil axillary nodes and establishment of interaxillary pathway, L inguinal nodes and establishment of axilloinguinal pathway, then L breast moving fluid towards pathways spending extra time in any areas of fibrosis then retracing all steps. Extra time spent at medial breast.   06/24/23 Left breast still swollen with increased fibrosis left medial inferior breast, and enlarged pores, and thickened skin but improving In supine: Short neck, 5 diaphragmatic breaths, bil axillary nodes and establishment of interaxillary pathway, L inguinal nodes and establishment of axilloinguinal pathway, then L breast moving fluid towards pathways spending extra time in any areas of fibrosis then retracing all steps. Extra  time spent at medial breast.   06/17/2023 With pt permission;daughter did not come in Left breast still swollen with increased fibrosis left medial inferior breast, and enlarged pores, and thickened skin but improving In supine: Short neck, 5 diaphragmatic breaths, bil axillary nodes and establishment of interaxillary pathway, L inguinal nodes and establishment of axilloinguinal pathway, then L breast moving fluid towards pathways spending extra time in any areas of fibrosis then retracing all steps. Extra time spent at medial breast. Had pt practice anterior interaxillary pathway and left medial breast but required multiple VC's  06/06/23 In supine: Short neck, 5 diaphragmatic breaths, bil axillary nodes and establishment of interaxillary pathway, L inguinal nodes and establishment of axilloinguinal pathway, then L breast moving fluid towards pathways spending extra time in any areas of fibrosis then retracing all steps. Daughter was present.   PATIENT EDUCATION:  Education details: lymphedema and how it is managed with manual lymphatic drainage and compression bra and swell spot or foam; began instruction of self MLD Person educated: Patient, daughter Education method: Explanation and Handouts, demonstration and verbal and tactile cues Education comprehension: verbalized understanding, will benefit from further review  HOME EXERCISE PROGRAM: Obtain compression bra  ASSESSMENT:  CLINICAL IMPRESSION: Continued POC for breast lymphedema.  Pt is unable to do at home due to dementia family unable to assist.  Pt does have improvement in the breast status post MT but it seems to return after each visit.    OBJECTIVE IMPAIRMENTS: decreased knowledge of condition, decreased knowledge of use of DME, increased edema, and postural dysfunction.   ACTIVITY LIMITATIONS:  none  PARTICIPATION LIMITATIONS:  none  PERSONAL FACTORS: Time since onset of injury/illness/exacerbation and 1 comorbidity:  dementia  are also affecting patient's functional outcome.   REHAB POTENTIAL: Good  CLINICAL DECISION MAKING: Stable/uncomplicated  EVALUATION COMPLEXITY: Low  GOALS: Goals reviewed with patient? Yes  SHORT TERM GOALS=LONG TERM GOALS Target date: 04/17/23  Pt will obtain a compression bra for long term management of L breast lymphedema.  Baseline: Goal status: MET 04/11/23   2.  Pt's daughters will be independent in breast MLD for long term management of lymphedema.  Baseline:  Goal status: MET/DEFERRED  3.  Pt will demonstrate a 50% improvement in fibrosis in L breast to decrease risk of infection.  Baseline:  Goal status: ONGOING  PLAN:  PT FREQUENCY: 1 visit per week   PT DURATION: 5 weeks   PLANNED INTERVENTIONS: 97140- Manual therapy, [id number]- Orthotic Fit/training, Patient/Family education, Therapeutic exercises, Therapeutic activity, and Self Care  PLAN FOR NEXT SESSION: Cont MLD to L breast - daughters will not do at home. Check compression bra she is getting today,   Idamae Lusher, PT 07/16/2023,  12:45 PM  Self manual lymph drainage: Perform this sequence once a day.  Only give enough pressure to your skin to make the skin move.  Diaphragmatic - Supine   Inhale through nose making navel move out toward hands. Exhale through puckered lips, hands follow navel in. Repeat _5__ times. Rest _10__ seconds between repeats.   Copyright  VHI. All rights reserved.  Hug yourself.  Do circles at your neck just above your collarbones.  Repeat this 10 times.  Axilla - One at a Time   Using full weight of flat hand and fingers at center of uninvolved armpit, make _10__ in-place circles.   Copyright  VHI. All rights reserved.  LEG: Inguinal Nodes Stimulation   With small finger side of hand against hip crease on involved side, gently perform circles at the crease. Repeat __10_ times.   Copyright  VHI. All rights reserved.  Axilla to Inguinal Nodes -  Sweep   On involved side, pump _4__ times from armpit along side of trunk to hip crease.  Now gently stretch skin from the involved side to the uninvolved side across the chest at the shoulder line.  Repeat that 4 times.  Draw an imaginary diagonal line from upper outer breast through the nipple area toward lower inner breast.  Direct fluid upward and inward from this line toward the pathway across your upper chest .  Do this in three rows to treat all of the upper inner breast tissue, and do each row 3-4x.      Direct fluid to treat all of lower outer breast tissue downward and outward toward      pathway that is aimed at the left groin.  Finish by doing the pathways as described above going from your involved armpit to the same side groin and going across your upper chest from the involved shoulder to the uninvolved shoulder.  Repeat the steps above where you do circles in your left groin and right armpit. Copyright  VHI. All rights reserved.    Cancer Rehab [phone]

## 2023-07-18 ENCOUNTER — Other Ambulatory Visit: Payer: Self-pay

## 2023-07-18 ENCOUNTER — Other Ambulatory Visit: Payer: Self-pay | Admitting: Internal Medicine

## 2023-07-23 ENCOUNTER — Ambulatory Visit: Payer: Medicare Other | Attending: Surgery

## 2023-07-23 DIAGNOSIS — L599 Disorder of the skin and subcutaneous tissue related to radiation, unspecified: Secondary | ICD-10-CM | POA: Diagnosis present

## 2023-07-23 DIAGNOSIS — I890 Lymphedema, not elsewhere classified: Secondary | ICD-10-CM | POA: Diagnosis present

## 2023-07-23 NOTE — Therapy (Signed)
 OUTPATIENT PHYSICAL THERAPY  UPPER EXTREMITY ONCOLOGY TREATMENT     Patient Name: Angelica [name] MRN: [id number] DOB:[birth year], 83 y.o., female Today's Date: 07/23/2023  END OF SESSION:  PT End of Session - 07/23/23 1107     Visit Number 14    Number of Visits 17    Date for PT Re-Evaluation 07/29/23    Authorization Type 5 visits- 07/01/2023-08/05/2023    Authorization - Visit Number 2    Authorization - Number of Visits 5    PT Start Time 1104    PT Stop Time 1200    PT Time Calculation (min) 56 min    Activity Tolerance Patient tolerated treatment well    Behavior During Therapy [place] for tasks assessed/performed                 Past Medical History:  Diagnosis Date   Arthritis    Asthma    Bowel obstruction (HCC)    Cancer (HCC)    Environmental allergies    GERD (gastroesophageal reflux disease)    Glaucoma 05/09/2017   H. pylori infection    History of radiation therapy 07/05/20-08/05/20   Left lung IMRT Dr. Roselind Messier    HLD (hyperlipidemia) 01/16/2019   Hypertension    Iron deficiency anemia    Osteopenia 05/26/2017   Thyroid disease    Urticaria 07/25/2018   Past Surgical History:  Procedure Laterality Date   ABDOMINAL HYSTERECTOMY     BOWEL RESECTION N/A 05/23/2020   Procedure: SMALL BOWEL REPAIR;  Surgeon: Sheliah Hatch De Blanch, MD;  Location: [facility];  Service: General;  Laterality: N/A;   BREAST BIOPSY     BRONCHIAL BIOPSY  06/10/2020   Procedure: BRONCHIAL BIOPSIES;  Surgeon: [name], DO;  Location: [facility];  Service: Pulmonary;;   BRONCHIAL BRUSHINGS  06/10/2020   Procedure: BRONCHIAL BRUSHINGS;  Surgeon: [name], DO;  Location: [facility];  Service: Pulmonary;;   BRONCHIAL NEEDLE ASPIRATION BIOPSY  06/10/2020   Procedure: BRONCHIAL NEEDLE ASPIRATION BIOPSIES;  Surgeon: [name], DO;  Location: [facility];  Service: Pulmonary;;   BRONCHIAL WASHINGS  06/10/2020   Procedure: BRONCHIAL WASHINGS;  Surgeon: [name],  DO;  Location: [facility];  Service: Pulmonary;;   COLON SURGERY     DIAGNOSTIC LARYNGOSCOPY N/A 05/23/2020   Procedure: ATTEMPTED DIAGNOSTIC LAPAROSCOPY WITH ADHESIONS;  Surgeon: Sheliah Hatch, De Blanch, MD;  Location: [facility];  Service: General;  Laterality: N/A;   IR IMAGING GUIDED PORT INSERTION  07/15/2020   IR REMOVAL TUN ACCESS W/ PORT W/O FL MOD SED  01/16/2023   KNEE SURGERY     LAPAROTOMY N/A 04/18/2015   Procedure: Exploratory laparotomy with lysis of adhesions, possible bowel resection;  Surgeon: Axel Filler, MD;  Location: [facility];  Service: General;  Laterality: N/A;   LAPAROTOMY N/A 05/23/2020   Procedure: EXPLORATORY LAPAROTOMY;  Surgeon: Rodman Pickle, MD;  Location: [facility];  Service: General;  Laterality: N/A;   LYSIS OF ADHESION N/A 05/23/2020   Procedure: LYSIS OF ADHESION;  Surgeon: Sheliah Hatch De Blanch, MD;  Location: [facility];  Service: General;  Laterality: N/A;   NASAL SINUS SURGERY     THORACENTESIS Left 12/03/2022   Procedure: THORACENTESIS;  Surgeon: [name], DO;  Location: [facility];  Service: Pulmonary;  Laterality: Left;   THYROID SURGERY     VIDEO BRONCHOSCOPY WITH ENDOBRONCHIAL NAVIGATION Bilateral 06/10/2020   Procedure: VIDEO BRONCHOSCOPY WITH ENDOBRONCHIAL NAVIGATION;  Surgeon: [name], DO;  Location: [facility];  Service: Pulmonary;  Laterality: Bilateral;   VIDEO BRONCHOSCOPY WITH ENDOBRONCHIAL ULTRASOUND  06/10/2020   Procedure: VIDEO BRONCHOSCOPY WITH ENDOBRONCHIAL ULTRASOUND;  Surgeon: [name], DO;  Location: [facility];  Service: Pulmonary;;   Patient Active Problem List   Diagnosis Date Noted   Arthritis of right knee 06/20/2023   Corn of toe 06/20/2023   Mastodynia of left breast 02/13/2023   Pleural effusion 11/21/2022   Swelling of breast 11/17/2022   Dyspnea 11/14/2022   Cellulitis of left breast 10/17/2022   Iron deficiency anemia 10/11/2022   Acute respiratory failure with hypoxia (HCC) 10/05/2022    Bilateral pleural effusion 10/05/2022   History of stroke 10/05/2022   Weakness 10/05/2022   Edema 10/05/2022   Alzheimer's disease (HCC) 09/06/2022   Ex-smoker 09/06/2022   Hypothyroidism 09/06/2022   Right leg pain 07/13/2021   Dementia (HCC) 07/13/2021   RLQ abdominal pain 07/13/2021   Encounter for well adult exam with abnormal findings 06/10/2021   Anosmia 05/01/2021   Chronic ethmoidal sinusitis 02/14/2021   Debility 02/01/2021   Gait disorder 02/01/2021   Port-A-Cath in place 11/02/2020   Adenocarcinoma of left lung, stage 3 (HCC) 06/22/2020   Lung mass    Pulmonary nodule 05/17/2020   Iron deficiency 05/03/2020   Swelling of lower extremity 07/30/2019   Ganglion cyst of right foot 01/16/2019   HLD (hyperlipidemia) 01/16/2019   LPRD (laryngopharyngeal reflux disease) 07/25/2018   Constipation 08/19/2017   Asthma, severe persistent, well-controlled 06/04/2017   Chronic pansinusitis 06/04/2017   Nasal polyposis 06/04/2017   Osteopenia 05/26/2017   GERD (gastroesophageal reflux disease)    H. pylori infection    Other allergic rhinitis 03/20/2016   Malnutrition of moderate degree 04/11/2015   HTN (hypertension) 04/10/2015    PCP: Oliver Barre, MD  REFERRING PROVIDER: Harriette Bouillon, MD  REFERRING DIAG: I89.0 (ICD-10-CM) - Lymphedema, not elsewhere classified  THERAPY DIAG:  Lymphedema, not elsewhere classified  Disorder of the skin and subcutaneous tissue related to radiation, unspecified  ONSET DATE: pt unable to give a date - "it has been a while"  Rationale for Evaluation and Treatment: Rehabilitation  SUBJECTIVE:                                                                                                                                                                                           SUBJECTIVE STATEMENT:  I do the massage at night before I go to bed. It feels softer in the morning but then full again when I go to bed.    PERTINENT HISTORY:  history of non-small cell lung cancer with most recent CT 10/05/2022 demonstrating diffuse  anasarca, as well as bilateral pleural effusions, hx of radiation to chest,  hx of breast biopsy (benign), dementia and Alzheimer's   PAIN:  Are you having pain? No, not right now  PRECAUTIONS: Other: chart states pt has Alzheimer's/dementia - pt unable to verify diagnosis in chart, no one else present with pt to assist  RED FLAGS: Unable to ask as pt has memory issues and no one present at appointment  WEIGHT BEARING RESTRICTIONS: No  FALLS:  Has patient fallen in last 6 months? No  LIVING ENVIRONMENT: Lives with: lives with their daughter Lives in: House/apartment Has following equipment at home: Dan Humphreys - 4 wheeled pt reports she does not use it  OCCUPATION: retired  LEISURE: pt reports she tries to walk  HAND DOMINANCE: right   PRIOR LEVEL OF FUNCTION: Independent does have memory issues  PATIENT GOALS: pt reports she does not know   OBJECTIVE: Note: Objective measures were completed at Evaluation unless otherwise noted.  COGNITION: Overall cognitive status: Within functional limits for tasks assessed   PALPATION: Fibrosis present in inferior L breast  OBSERVATIONS / OTHER ASSESSMENTS: L significantly larger than R breast, approximately double the size  POSTURE: forward head, rounded shoulders  UPPER EXTREMITY AROM/PROM: WFL  CERVICAL AROM: All within normal limits:    Percent limited  Flexion WFL  Extension 75% limited  Right lateral flexion WFL  Left lateral flexion 25% limited  Right rotation WFL  Left rotation WFL    LYMPHEDEMA ASSESSMENTS:   LANDMARK RIGHT  eval  At axilla  28.5  15 cm proximal to olecranon process 25.6  10 cm proximal to olecranon process 25.5  Olecranon process 22.5  15 cm proximal to ulnar styloid process 22.1  10 cm proximal to ulnar styloid process 18.5  Just proximal to ulnar styloid process 14.5  Across hand at thumb web space  18.8  At base of 2nd digit 6  (Blank rows = not tested)  LANDMARK LEFT  eval  At axilla  28.5  15 cm proximal to olecranon process 26.5  10 cm proximal to olecranon process 25.9  Olecranon process 24  15 cm proximal to ulnar styloid process 21.5  10 cm proximal to ulnar styloid process 18.1  Just proximal to ulnar styloid process 14.5  Across hand at thumb web space 18.2  At base of 2nd digit 5.6  (Blank rows = not tested)  LLIS:  Flowsheet Row Outpatient Rehab from 03/20/2023 in [facility]  Lymphedema Life Impact Scale Total Score 14.71 %      14.71   BREAST COMPLAINTS QUESTIONNAIRE Pain:  0 Heaviness: 5 Swollen feeling:0 Tense Skin: 0 Redness: 0 Bra Print: 0 Size of Pores: 0 Hard feeling:  3 Total:    8 /80 A Score over 9 indicates lymphedema issues in the breast   TODAY'S TREATMENT:  DATE:  07/23/23: Manual Therapy MLD In supine: Short neck, superficial and deep abdominals, Rt axillary and pectoral nodes, Rt intact anterior upper quadrant and establishment of anterior interaxillary pathway, Lt inguinal nodes and establishment of Lt axilloinguinal pathway, then Lt medial breast moving fluid towards anterior anastomosis, spending extra time in areas of fibrosis then, Rt S/L for focus to lateral and inferior breast redirecting towards the lateral anastomosis, then finished retracing all steps in supine.  07/16/23 Left breast still swollen with increased fibrosis left medial inferior breast, and enlarged pores, and thickened skin but improving In supine: Short neck, 5 diaphragmatic breaths, bil axillary nodes and establishment of interaxillary pathway, L inguinal nodes and establishment of axilloinguinal pathway, then L breast moving fluid towards pathways spending extra time in any areas of fibrosis then  retracing all steps. Extra time spent at medial breast.   06/24/23 Left breast still swollen with increased fibrosis left medial inferior breast, and enlarged pores, and thickened skin but improving In supine: Short neck, 5 diaphragmatic breaths, bil axillary nodes and establishment of interaxillary pathway, L inguinal nodes and establishment of axilloinguinal pathway, then L breast moving fluid towards pathways spending extra time in any areas of fibrosis then retracing all steps. Extra time spent at medial breast.   06/17/2023 With pt permission;daughter did not come in Left breast still swollen with increased fibrosis left medial inferior breast, and enlarged pores, and thickened skin but improving In supine: Short neck, 5 diaphragmatic breaths, bil axillary nodes and establishment of interaxillary pathway, L inguinal nodes and establishment of axilloinguinal pathway, then L breast moving fluid towards pathways spending extra time in any areas of fibrosis then retracing all steps. Extra time spent at medial breast. Had pt practice anterior interaxillary pathway and left medial breast but required multiple VC's  06/06/23 In supine: Short neck, 5 diaphragmatic breaths, bil axillary nodes and establishment of interaxillary pathway, L inguinal nodes and establishment of axilloinguinal pathway, then L breast moving fluid towards pathways spending extra time in any areas of fibrosis then retracing all steps. Daughter was present.   PATIENT EDUCATION:  Education details: lymphedema and how it is managed with manual lymphatic drainage and compression bra and swell spot or foam; began instruction of self MLD Person educated: Patient, daughter Education method: Explanation and Handouts, demonstration and verbal and tactile cues Education comprehension: verbalized understanding, will benefit from further review  HOME EXERCISE PROGRAM: Obtain compression bra  ASSESSMENT:  CLINICAL IMPRESSION: Continued  with Lt breast MLD for medial and inferior fibrosis. Pt reports overall she has noticed the breast has reduced though she does still have large areas of fibrosis and peau d'orange. She states that she works on her breast with self MLD at night but she conts to struggle with correct sequence and pressure due to dementia. She is being complaint with wear of her compression bra day and night.   OBJECTIVE IMPAIRMENTS: decreased knowledge of condition, decreased knowledge of use of DME, increased edema, and postural dysfunction.   ACTIVITY LIMITATIONS:  none  PARTICIPATION LIMITATIONS:  none  PERSONAL FACTORS: Time since onset of injury/illness/exacerbation and 1 comorbidity: dementia  are also affecting patient's functional outcome.   REHAB POTENTIAL: Good  CLINICAL DECISION MAKING: Stable/uncomplicated  EVALUATION COMPLEXITY: Low  GOALS: Goals reviewed with patient? Yes  SHORT TERM GOALS=LONG TERM GOALS Target date: 04/17/23  Pt will obtain a compression bra for long term management of L breast lymphedema.  Baseline: Goal status: MET 04/11/23   2.  Pt's daughters will  be independent in breast MLD for long term management of lymphedema.  Baseline:  Goal status: MET/DEFERRED  3.  Pt will demonstrate a 50% improvement in fibrosis in L breast to decrease risk of infection.  Baseline:  Goal status: ONGOING  PLAN:  PT FREQUENCY: 1 visit per week   PT DURATION: 5 weeks   PLANNED INTERVENTIONS: 97140- Manual therapy, 97760- Orthotic Fit/training, Patient/Family education, Therapeutic exercises, Therapeutic activity, and Self Care  PLAN FOR NEXT SESSION: Cont MLD to L breast - daughters will not do at home.  Benefit from a swell spot??   Hermenia Bers, PTA 07/23/2023, 12:20 PM  Self manual lymph drainage: Perform this sequence once a day.  Only give enough pressure to your skin to make the skin move.  Diaphragmatic - Supine   Inhale through nose making navel move out  toward hands. Exhale through puckered lips, hands follow navel in. Repeat _5__ times. Rest _10__ seconds between repeats.   Copyright  VHI. All rights reserved.  Hug yourself.  Do circles at your neck just above your collarbones.  Repeat this 10 times.  Axilla - One at a Time   Using full weight of flat hand and fingers at center of uninvolved armpit, make _10__ in-place circles.   Copyright  VHI. All rights reserved.  LEG: Inguinal Nodes Stimulation   With small finger side of hand against hip crease on involved side, gently perform circles at the crease. Repeat __10_ times.   Copyright  VHI. All rights reserved.  Axilla to Inguinal Nodes - Sweep   On involved side, pump _4__ times from armpit along side of trunk to hip crease.  Now gently stretch skin from the involved side to the uninvolved side across the chest at the shoulder line.  Repeat that 4 times.  Draw an imaginary diagonal line from upper outer breast through the nipple area toward lower inner breast.  Direct fluid upward and inward from this line toward the pathway across your upper chest .  Do this in three rows to treat all of the upper inner breast tissue, and do each row 3-4x.      Direct fluid to treat all of lower outer breast tissue downward and outward toward      pathway that is aimed at the left groin.  Finish by doing the pathways as described above going from your involved armpit to the same side groin and going across your upper chest from the involved shoulder to the uninvolved shoulder.  Repeat the steps above where you do circles in your left groin and right armpit. Copyright  VHI. All rights reserved.    Cancer Rehab [phone]

## 2023-07-24 ENCOUNTER — Ambulatory Visit: Payer: Medicare HMO

## 2023-07-29 ENCOUNTER — Ambulatory Visit: Payer: Medicare Other

## 2023-07-29 DIAGNOSIS — I890 Lymphedema, not elsewhere classified: Secondary | ICD-10-CM

## 2023-07-29 DIAGNOSIS — L599 Disorder of the skin and subcutaneous tissue related to radiation, unspecified: Secondary | ICD-10-CM

## 2023-07-29 NOTE — Therapy (Addendum)
 OUTPATIENT PHYSICAL THERAPY  UPPER EXTREMITY ONCOLOGY TREATMENT     Patient Name: Angelica [name] MRN: [id number] DOB:05-Apr-1941, 83 y.o., female Today's Date: 07/29/2023  END OF SESSION:  PT End of Session - 07/29/23 1107     Visit Number 15    Number of Visits 17    Date for PT Re-Evaluation 07/29/23    Authorization Type 5 visits- 07/01/2023-08/05/2023    Authorization - Visit Number 3    Authorization - Number of Visits 5    PT Start Time 1104    PT Stop Time 1159    PT Time Calculation (min) 55 min    Activity Tolerance Patient tolerated treatment well    Behavior During Therapy [facility] for tasks assessed/performed                 Past Medical History:  Diagnosis Date   Arthritis    Asthma    Bowel obstruction (HCC)    Cancer (HCC)    Environmental allergies    GERD (gastroesophageal reflux disease)    Glaucoma 05/09/2017   H. pylori infection    History of radiation therapy 07/05/20-08/05/20   Left lung IMRT Dr. Eloise Hake    HLD (hyperlipidemia) 01/16/2019   Hypertension    Iron  deficiency anemia    Osteopenia 05/26/2017   Thyroid  disease    Urticaria 07/25/2018   Past Surgical History:  Procedure Laterality Date   ABDOMINAL HYSTERECTOMY     BOWEL RESECTION N/A 05/23/2020   Procedure: SMALL BOWEL REPAIR;  Surgeon: Dorrie Gaudier Alphonso Aschoff, MD;  Location: [facility] OR;  Service: General;  Laterality: N/A;   BREAST BIOPSY     BRONCHIAL BIOPSY  06/10/2020   Procedure: BRONCHIAL BIOPSIES;  Surgeon: Prudy Brownie, DO;  Location: [facility] ENDOSCOPY;  Service: Pulmonary;;   BRONCHIAL BRUSHINGS  06/10/2020   Procedure: BRONCHIAL BRUSHINGS;  Surgeon: Prudy Brownie, DO;  [facility];  Service: Pulmonary;;   BRONCHIAL NEEDLE ASPIRATION BIOPSY  06/10/2020   Procedure: BRONCHIAL NEEDLE ASPIRATION BIOPSIES;  Surgeon: Prudy Brownie, DO;  [facility]: [facility];  Service: Pulmonary;;   BRONCHIAL WASHINGS  06/10/2020   Procedure: BRONCHIAL WASHINGS;  Surgeon: Prudy Brownie,  DO;  [facility]: [facility];  Service: Pulmonary;;   COLON SURGERY     DIAGNOSTIC LARYNGOSCOPY N/A 05/23/2020   Procedure: ATTEMPTED DIAGNOSTIC LAPAROSCOPY WITH ADHESIONS;  Surgeon: Dorrie Gaudier, Alphonso Aschoff, MD;  Location: [facility] OR;  Service: General;  Laterality: N/A;   IR IMAGING GUIDED PORT INSERTION  07/15/2020   IR REMOVAL TUN ACCESS W/ PORT W/O FL MOD SED  01/16/2023   KNEE SURGERY     LAPAROTOMY N/A 04/18/2015   Procedure: Exploratory laparotomy with lysis of adhesions, possible bowel resection;  Surgeon: Shela Derby, MD;  Location: [facility];  Service: General;  Laterality: N/A;   LAPAROTOMY N/A 05/23/2020   Procedure: EXPLORATORY LAPAROTOMY;  Surgeon: Derral Flick, MD;  Location: [facility];  Service: General;  Laterality: N/A;   LYSIS OF ADHESION N/A 05/23/2020   Procedure: LYSIS OF ADHESION;  Surgeon: Dorrie Gaudier Alphonso Aschoff, MD;  [facility];  Service: General;  Laterality: N/A;   NASAL SINUS SURGERY     THORACENTESIS Left 12/03/2022   Procedure: THORACENTESIS;  Surgeon: Prudy Brownie, DO;  [facility];  Service: Pulmonary;  Laterality: Left;   THYROID  SURGERY     VIDEO BRONCHOSCOPY WITH ENDOBRONCHIAL NAVIGATION Bilateral 06/10/2020   Procedure: VIDEO BRONCHOSCOPY WITH ENDOBRONCHIAL NAVIGATION;  Surgeon: Prudy Brownie, DO;  [facility];  Service: Pulmonary;  Laterality: Bilateral;   VIDEO BRONCHOSCOPY WITH ENDOBRONCHIAL ULTRASOUND  06/10/2020   Procedure: VIDEO BRONCHOSCOPY WITH ENDOBRONCHIAL ULTRASOUND;  Surgeon: Prudy Brownie, DO;  [facility];  Service: Pulmonary;;   Patient Active Problem List   Diagnosis Date Noted   Arthritis of right knee 06/20/2023   Corn of toe 06/20/2023   Mastodynia of left breast 02/13/2023   Pleural effusion 11/21/2022   Swelling of breast 11/17/2022   Dyspnea 11/14/2022   Cellulitis of left breast 10/17/2022   Iron  deficiency anemia 10/11/2022   Acute respiratory failure with hypoxia (HCC) 10/05/2022    Bilateral pleural effusion 10/05/2022   History of stroke 10/05/2022   Weakness 10/05/2022   Edema 10/05/2022   Alzheimer's disease (HCC) 09/06/2022   Ex-smoker 09/06/2022   Hypothyroidism 09/06/2022   Right leg pain 07/13/2021   Dementia (HCC) 07/13/2021   RLQ abdominal pain 07/13/2021   Encounter for well adult exam with abnormal findings 06/10/2021   Anosmia 05/01/2021   Chronic ethmoidal sinusitis 02/14/2021   Debility 02/01/2021   Gait disorder 02/01/2021   Port-A-Cath in place 11/02/2020   Adenocarcinoma of left lung, stage 3 (HCC) 06/22/2020   Lung mass    Pulmonary nodule 05/17/2020   Iron  deficiency 05/03/2020   Swelling of lower extremity 07/30/2019   Ganglion cyst of right foot 01/16/2019   HLD (hyperlipidemia) 01/16/2019   LPRD (laryngopharyngeal reflux disease) 07/25/2018   Constipation 08/19/2017   Asthma, severe persistent, well-controlled 06/04/2017   Chronic pansinusitis 06/04/2017   Nasal polyposis 06/04/2017   Osteopenia 05/26/2017   GERD (gastroesophageal reflux disease)    H. pylori infection    Other allergic rhinitis 03/20/2016   Malnutrition of moderate degree 04/11/2015   HTN (hypertension) 04/10/2015    PCP: Rosalia Colonel, MD  REFERRING PROVIDER: Sim Dryer, MD  REFERRING DIAG: I89.0 (ICD-10-CM) - Lymphedema, not elsewhere classified  THERAPY DIAG:  Lymphedema, not elsewhere classified  Disorder of the skin and subcutaneous tissue related to radiation, unspecified  ONSET DATE: pt unable to give a date - "it has been a while"  Rationale for Evaluation and Treatment: Rehabilitation  SUBJECTIVE:                                                                                                                                                                                           SUBJECTIVE STATEMENT:  Overall my Lt breast has gotten smaller, even though it's still a lot bigger than the other one. I try to do the massage every night before  bed and I'm wearing my compression bra.   PERTINENT HISTORY: history of non-small cell lung cancer  with most recent CT 10/05/2022 demonstrating diffuse anasarca, as well as bilateral pleural effusions, hx of radiation to chest,  hx of breast biopsy (benign), dementia and Alzheimer's   PAIN:  Are you having pain? No, not right now  PRECAUTIONS: Other: chart states pt has Alzheimer's/dementia - pt unable to verify diagnosis in chart, no one else present with pt to assist  RED FLAGS: Unable to ask as pt has memory issues and no one present at appointment  WEIGHT BEARING RESTRICTIONS: No  FALLS:  Has patient fallen in last 6 months? No  LIVING ENVIRONMENT: Lives with: lives with their daughter Lives in: House/apartment Has following equipment at home: Otho Blitz - 4 wheeled pt reports she does not use it  OCCUPATION: retired  LEISURE: pt reports she tries to walk  HAND DOMINANCE: right   PRIOR LEVEL OF FUNCTION: Independent does have memory issues  PATIENT GOALS: pt reports she does not know   OBJECTIVE: Note: Objective measures were completed at Evaluation unless otherwise noted.  COGNITION: Overall cognitive status: Within functional limits for tasks assessed   PALPATION: Fibrosis present in inferior L breast  OBSERVATIONS / OTHER ASSESSMENTS: L significantly larger than R breast, approximately double the size  POSTURE: forward head, rounded shoulders  UPPER EXTREMITY AROM/PROM: WFL  CERVICAL AROM: All within normal limits:    Percent limited  Flexion WFL  Extension 75% limited  Right lateral flexion WFL  Left lateral flexion 25% limited  Right rotation WFL  Left rotation WFL    LYMPHEDEMA ASSESSMENTS:   LANDMARK RIGHT  eval  At axilla  28.5  15 cm proximal to olecranon process 25.6  10 cm proximal to olecranon process 25.5  Olecranon process 22.5  15 cm proximal to ulnar styloid process 22.1  10 cm proximal to ulnar styloid process 18.5  Just proximal  to ulnar styloid process 14.5  Across hand at thumb web space 18.8  At base of 2nd digit 6  (Blank rows = not tested)  LANDMARK LEFT  eval  At axilla  28.5  15 cm proximal to olecranon process 26.5  10 cm proximal to olecranon process 25.9  Olecranon process 24  15 cm proximal to ulnar styloid process 21.5  10 cm proximal to ulnar styloid process 18.1  Just proximal to ulnar styloid process 14.5  Across hand at thumb web space 18.2  At base of 2nd digit 5.6  (Blank rows = not tested)  LLIS:  Flowsheet Row Outpatient Rehab from 03/20/2023 in [facility]  Lymphedema Life Impact Scale Total Score 14.71 %      14.71   BREAST COMPLAINTS QUESTIONNAIRE Pain:  0 Heaviness: 5 Swollen feeling:0 Tense Skin: 0 Redness: 0 Bra Print: 0 Size of Pores: 0 Hard feeling:  3 Total:    8 /80 A Score over 9 indicates lymphedema issues in the breast   TODAY'S TREATMENT:  DATE:  07/29/23: Self Care At beginning of session briefly reviewed self MLD sequence with pt. She reports not being sure if she still has her handout at home so printed her another one and reviewed this again as well answering her questions during. Manual Therapy MLD In supine: Short neck, 5 diaphragmatic breaths and reviewed proper deep breathing, Rt axillary and pectoral nodes, Rt intact anterior upper quadrant and establishment of anterior interaxillary pathway, Lt inguinal nodes and establishment of Lt axilloinguinal pathway, then Lt medial breast moving fluid towards anterior anastomosis, spending extra time in areas of fibrosis then, Rt S/L for focus to lateral and inferior breast redirecting towards the lateral anastomosis, then finished retracing all steps in supine. Issued 2 pieces of peach medi foam: 1 lined long rectangle to wear at inferior breast, and  then smaller square small dotted to wear at medial breast. Assisted pt with placing these at end of session  07/23/23: Manual Therapy MLD In supine: Short neck, superficial and deep abdominals, Rt axillary and pectoral nodes, Rt intact anterior upper quadrant and establishment of anterior interaxillary pathway, Lt inguinal nodes and establishment of Lt axilloinguinal pathway, then Lt medial breast moving fluid towards anterior anastomosis, spending extra time in areas of fibrosis then, Rt S/L for focus to lateral and inferior breast redirecting towards the lateral anastomosis, then finished retracing all steps in supine.  07/16/23 Left breast still swollen with increased fibrosis left medial inferior breast, and enlarged pores, and thickened skin but improving In supine: Short neck, 5 diaphragmatic breaths, bil axillary nodes and establishment of interaxillary pathway, L inguinal nodes and establishment of axilloinguinal pathway, then L breast moving fluid towards pathways spending extra time in any areas of fibrosis then retracing all steps. Extra time spent at medial breast.   06/24/23 Left breast still swollen with increased fibrosis left medial inferior breast, and enlarged pores, and thickened skin but improving In supine: Short neck, 5 diaphragmatic breaths, bil axillary nodes and establishment of interaxillary pathway, L inguinal nodes and establishment of axilloinguinal pathway, then L breast moving fluid towards pathways spending extra time in any areas of fibrosis then retracing all steps. Extra time spent at medial breast.   06/17/2023 With pt permission;daughter did not come in Left breast still swollen with increased fibrosis left medial inferior breast, and enlarged pores, and thickened skin but improving In supine: Short neck, 5 diaphragmatic breaths, bil axillary nodes and establishment of interaxillary pathway, L inguinal nodes and establishment of axilloinguinal pathway, then L breast  moving fluid towards pathways spending extra time in any areas of fibrosis then retracing all steps. Extra time spent at medial breast. Had pt practice anterior interaxillary pathway and left medial breast but required multiple VC's  06/06/23 In supine: Short neck, 5 diaphragmatic breaths, bil axillary nodes and establishment of interaxillary pathway, L inguinal nodes and establishment of axilloinguinal pathway, then L breast moving fluid towards pathways spending extra time in any areas of fibrosis then retracing all steps. Daughter was present.   PATIENT EDUCATION:  Education details: lymphedema and how it is managed with manual lymphatic drainage and compression bra and swell spot or foam; began instruction of self MLD Person educated: Patient, daughter Education method: Explanation and Handouts, demonstration and verbal and tactile cues Education comprehension: verbalized understanding, will benefit from further review  HOME EXERCISE PROGRAM: Obtain compression bra  ASSESSMENT:  CLINICAL IMPRESSION: Continued with Lt breast MLD for medial and inferior fibrosis. Reviewed with handout today as pt reports she can't find hers  at home. Then issued 2 new pieces of compression foam for pt to try to wear in her bra, assisted with placing these for her, see above, in compression bra at end of session. Pt will be ready for D/C at next session per POC. Spoke with daughter that brought her today and she verbalized understanding as well as pt.   OBJECTIVE IMPAIRMENTS: decreased knowledge of condition, decreased knowledge of use of DME, increased edema, and postural dysfunction.   ACTIVITY LIMITATIONS: none  PARTICIPATION LIMITATIONS: none  PERSONAL FACTORS: Time since onset of injury/illness/exacerbation and 1 comorbidity: dementia are also affecting patient's functional outcome.   REHAB POTENTIAL: Good  CLINICAL DECISION MAKING: Stable/uncomplicated  EVALUATION COMPLEXITY: Low  GOALS: Goals  reviewed with patient? Yes  SHORT TERM GOALS=LONG TERM GOALS Target date: 04/17/23  Pt will obtain a compression bra for long term management of L breast lymphedema.  Baseline: Goal status: MET 04/11/23   2.  Pt's daughters will be independent in breast MLD for long term management of lymphedema.  Baseline:  Goal status: MET/DEFERRED  3.  Pt will demonstrate a 50% improvement in fibrosis in L breast to decrease risk of infection.  Baseline:  Goal status: ONGOING  PLAN:  PT FREQUENCY: 1 visit per week   PT DURATION: 5 weeks   PLANNED INTERVENTIONS: 97140- Manual therapy, 97760- Orthotic Fit/training, Patient/Family education, Therapeutic exercises, Therapeutic activity, and Self Care  PLAN FOR NEXT SESSION: How was new foam issued at today's session? Issue info on how to order a swell spot. Cont MLD to L breast - daughters will not do at home.   Denyce Flank, PTA 07/29/2023, 1:12 PM  Self manual lymph drainage: Perform this sequence once a day.  Only give enough pressure to your skin to make the skin move.  Diaphragmatic - Supine   Inhale through nose making navel move out toward hands. Exhale through puckered lips, hands follow navel in. Repeat _5__ times. Rest _10__ seconds between repeats.   Copyright  VHI. All rights reserved.  Hug yourself.  Do circles at your neck just above your collarbones.  Repeat this 10 times.  Axilla - One at a Time   Using full weight of flat hand and fingers at center of uninvolved armpit, make _10__ in-place circles.   Copyright  VHI. All rights reserved.  LEG: Inguinal Nodes Stimulation   With small finger side of hand against hip crease on involved side, gently perform circles at the crease. Repeat __10_ times.   Copyright  VHI. All rights reserved.  Axilla to Inguinal Nodes - Sweep   On involved side, pump _4__ times from armpit along side of trunk to hip crease.  Now gently stretch skin from the involved side  to the uninvolved side across the chest at the shoulder line.  Repeat that 4 times.  Draw an imaginary diagonal line from upper outer breast through the nipple area toward lower inner breast.  Direct fluid upward and inward from this line toward the pathway across your upper chest .  Do this in three rows to treat all of the upper inner breast tissue, and do each row 3-4x.      Direct fluid to treat all of lower outer breast tissue downward and outward toward      pathway that is aimed at the left groin.  Finish by doing the pathways as described above going from your involved armpit to the same side groin and going across your upper chest from the involved  shoulder to the uninvolved shoulder.  Repeat the steps above where you do circles in your left groin and right armpit. Copyright  VHI. All rights reserved.    Cancer Rehab [id number]   PHYSICAL THERAPY DISCHARGE SUMMARY  Visits from Start of Care: 13  Current functional level related to goals / functional outcomes: Per above

## 2023-07-29 NOTE — Patient Instructions (Signed)
 Self manual lymph drainage: Perform this sequence once a day.  Only give enough pressure no your skin to make the skin move.  Hug yourself.  Do circles at your neck just above your collarbones.  Repeat this 10 times.  Diaphragmatic - Supine   Inhale through nose making navel move out toward hands. Exhale through puckered lips, hands follow navel in. Repeat _5__ times. Rest _10__ seconds between repeats.    Axilla - One at a Time   Using full weight of flat hand and fingers at center of uninvolved armpit, make _10__ in-place circles.   Copyright  VHI. All rights reserved.  LEG: Inguinal Nodes Stimulation   With small finger side of hand against hip crease on involved side, gently perform circles at the crease. Repeat __10_ times.   Copyright  VHI. All rights reserved.  Axilla to Inguinal Nodes - Sweep   On involved side, sweep _4__ times from armpit along side of trunk to hip crease.  Now gently stretch skin from the involved side to the uninvolved side across the chest at the shoulder line.  Repeat that 4 times.  Draw an imaginary diagonal line from upper outer breast through the nipple area toward lower inner breast.  Direct fluid upward and inward from this line toward the pathway across your upper chest .  Do this in three rows to treat all of the upper inner breast tissue, and do each row 3-4x.      Direct fluid to treat all of lower outer breast tissue downward and outward toward pathway that is aimed at the left groin.  Finish by doing the pathways as described above going from your involved armpit to the same side groin and going across your upper chest from the involved shoulder to the uninvolved shoulder.  Repeat the steps above where you do circles in your left groin and right armpit. Copyright  VHI. All rights reserved.

## 2023-07-30 ENCOUNTER — Other Ambulatory Visit: Payer: Self-pay | Admitting: Allergy

## 2023-07-31 ENCOUNTER — Ambulatory Visit: Admitting: *Deleted

## 2023-07-31 DIAGNOSIS — J4550 Severe persistent asthma, uncomplicated: Secondary | ICD-10-CM | POA: Diagnosis not present

## 2023-08-01 ENCOUNTER — Other Ambulatory Visit: Payer: Self-pay | Admitting: Adult Health

## 2023-08-01 NOTE — Telephone Encounter (Signed)
 Last seen on 04/10/23 Follow up scheduled on 10/30/23

## 2023-08-06 ENCOUNTER — Telehealth: Payer: Self-pay | Admitting: *Deleted

## 2023-08-06 ENCOUNTER — Other Ambulatory Visit: Payer: Self-pay | Admitting: Internal Medicine

## 2023-08-06 DIAGNOSIS — F0280 Dementia in other diseases classified elsewhere without behavioral disturbance: Secondary | ICD-10-CM

## 2023-08-06 NOTE — Progress Notes (Unsigned)
 Complex Care Management Note Care Guide Note  08/06/2023 Name: Angelica [name] MRN: [id number] DOB: [birth year]   Complex Care Management Outreach Attempts: An unsuccessful telephone outreach was attempted today to offer the patient information about available complex care management services.  Follow Up Plan:  Additional outreach attempts will be made to offer the patient complex care management information and services.   Encounter Outcome:  No Answer  Burman Nieves, CMA, Care Guide [facility]  [facility], [facility] Guide Direct Dial: [phone]  Fax: [phone] Website: [email or web address]

## 2023-08-07 NOTE — Progress Notes (Unsigned)
 Complex Care Management Note Care Guide Note  08/07/2023 Name: Angelica [name] MRN: [id number] DOB: 12-29-1940   Complex Care Management Outreach Attempts: A second unsuccessful outreach was attempted today to offer the patient with information about available complex care management services.  Follow Up Plan:  Additional outreach attempts will be made to offer the patient complex care management information and services.   Encounter Outcome:  No Answer  Burman Nieves, CMA, Care Guide [facility]  [facility], [facility] Guide Direct Dial: [phone]  Fax: [phone] Website: [email or web address]

## 2023-08-08 NOTE — Progress Notes (Signed)
 Complex Care Management Note  Care Guide Note 08/08/2023 Name: Angelica [name] MRN: [id number] DOB: 1941-05-07  Angelica [name] is a 83 y.o. year old female who sees [name], MD for primary care. I reached out to Madelin Rear by phone today to offer complex care management services.  Ms. Nunes was given information about Complex Care Management services today including:   The Complex Care Management services include support from the care team which includes your Nurse Care Manager, Clinical Social Worker, or Pharmacist.  The Complex Care Management team is here to help remove barriers to the health concerns and goals most important to you. Complex Care Management services are voluntary, and the patient may decline or stop services at any time by request to their care team member.   Complex Care Management Consent Status: Patient agreed to services and verbal consent obtained.   Follow up plan:  Telephone appointment with complex care management team member scheduled for:  08/15/2023  Encounter Outcome:  Patient Scheduled  Burman Nieves, CMA, Care Guide [facility]  [facility], [facility] Guide Direct Dial: [phone]  Fax: [phone] Website: [email or web address]

## 2023-08-15 ENCOUNTER — Ambulatory Visit: Payer: Self-pay | Admitting: *Deleted

## 2023-08-15 NOTE — Patient Outreach (Signed)
 Care Coordination   Initial Visit Note   08/16/2023 Name: Angelica [name] MRN: [id number] DOB: 1940/05/26  Angelica [name] is a 83 y.o. year old female who sees [name], MD for primary care. I spoke with  Madelin Rear by phone today.  What matters to the patients health and wellness today?  Patient's daughter interested in resources related to additional in home support.  She also verbalized need for additional strategies to increase medication adherence.    Goals Addressed             This Visit's Progress    Community resources support       Interventions Today    Flowsheet Row Most Recent Value  Chronic Disease   Chronic disease during today's visit Other  [Alzheimer's Disease]  General Interventions   General Interventions Discussed/Reviewed General Interventions Discussed, [employer], Level of Care  [Patient assessed for community resource needs related to dementia support-per daughter patient beginning to hide items, also repetitive behaviors noticed(cleaning the same drawers) maintains her own ADL's]  Level of Care Personal Care Services, Adult Daycare, Assisted Living  [Pt resides with daughter, positive supports include her family and church- visits w/ sister every weekend, no plans for out of home care- Adult Day Care options discussed. Private duty  and Adult Dat care resources to be provided by email.]  Exercise Interventions   Exercise Discussed/Reviewed Exercise Discussed  [takes walks with granddaughter during sunny weather]  Education Interventions   Education Provided Provided Education  Provided Verbal Education On [employer] support-establishing/maintaining a routine encouraged, providing frequent reassurance discussed as well as distracting attention to a different activity when repetitive behaviors are noticed, offering choice as much as possible]  Mental Health Interventions   Mental Health Discussed/Reviewed Mental Health  Discussed  [caregiver stress acknowledged, emphasized self care]  Pharmacy Interventions   Pharmacy Dicussed/Reviewed Pharmacy Topics Discussed, Medication Adherence  [confirms that patient hides medicines when she does not take them, daughter confirms need to  watch patient as she takes them to ensure adherance- daughter requesting suggestions on how to maintain adherance]  Safety Interventions   Safety Discussed/Reviewed Safety Discussed  [Denies tendancy to wander, however daughter has a ring camera to monitor patient when she is away.]  Advanced Directive Interventions   Advanced Directives Discussed/Reviewed Advanced Directives Reviewed  [reviewed in ACP]              SDOH assessments and interventions completed:  Yes  SDOH Interventions Today    Flowsheet Row Most Recent Value  SDOH Interventions   Food Insecurity Interventions Intervention Not Indicated  Transportation Interventions Intervention Not Indicated  Utilities Interventions Intervention Not Indicated        Care Coordination Interventions:  Yes, provided   Follow up plan: Follow up call scheduled for 09/05/23    Encounter Outcome:  Patient Visit Completed

## 2023-08-17 NOTE — Patient Instructions (Signed)
 Visit Information  Thank you for taking time to visit with me today. Please don't hesitate to contact me if I can be of assistance to you.   Following are the goals we discussed today:   Activities and task to complete in order to accomplish goals.   Keep all upcoming appointment discussed today Continue with compliance of taking medication prescribed by Doctor Review private duty care and Adult Day Care options emailed to daughter [email or web address] Follow up with your medical provider with any new concerns or questions    Our next appointment is by telephone on 09/05/23 at 3:30pm  Please call the care guide team at [phone] if you need to cancel or reschedule your appointment.   If you are experiencing a Mental Health or Behavioral Health Crisis or need someone to talk to, please call 911   Patient verbalizes understanding of instructions and care plan provided today and agrees to view in MyChart. Active MyChart status and patient understanding of how to access instructions and care plan via MyChart confirmed with patient.     Telephone follow up appointment with care management team member scheduled for: 09/05/23  Verna Czech, LCSW [place]  [facility], [facility] Licensed Clinical Social Worker Care Coordinator  Direct Dial: [phone]

## 2023-08-20 ENCOUNTER — Ambulatory Visit (INDEPENDENT_AMBULATORY_CARE_PROVIDER_SITE_OTHER): Payer: Medicare Other

## 2023-08-20 VITALS — BP 120/62 | HR 67 | Ht 64.0 in | Wt 130.6 lb

## 2023-08-20 DIAGNOSIS — Z0000 Encounter for general adult medical examination without abnormal findings: Secondary | ICD-10-CM | POA: Diagnosis not present

## 2023-08-20 NOTE — Patient Instructions (Addendum)
 Angelica [name] , Thank you for taking time to come for your Medicare Wellness Visit. I appreciate your ongoing commitment to your health goals. Please review the following plan we discussed and let me know if I can assist you in the future.   Referrals/Orders/Follow-Ups/Clinician Recommendations: Aim for 30 minutes of exercise or brisk walking, 6-8 glasses of water, and 5 servings of fruits and vegetables each day.   This is a list of the screening recommended for you and due dates:  Health Maintenance  Topic Date Due   DTaP/Tdap/Td vaccine (1 - Tdap) Never done   Zoster (Shingles) Vaccine (1 of 2) Never done   Pneumonia Vaccine (2 of 2 - PPSV23 or PCV20) 06/16/2024*   Flu Shot  12/20/2023   Medicare Annual Wellness Visit  08/19/2024   DEXA scan (bone density measurement)  Completed   HPV Vaccine  Aged Out   COVID-19 Vaccine  Discontinued  *Topic was postponed. The date shown is not the original due date.    Advanced directives: (In Chart) A copy of your advanced directives are scanned into your chart should your provider ever need it.  Next Medicare Annual Wellness Visit scheduled for next year: Yes

## 2023-08-20 NOTE — Progress Notes (Signed)
 Subjective:   Angelica [name] is a 83 y.o. who presents for a Medicare Wellness preventive visit.  Visit Complete: In person  Persons Participating in Visit: Patient assisted by daughter, Anderson Malta..  AWV Questionnaire: No: Patient Medicare AWV questionnaire was not completed prior to this visit.  Cardiac Risk Factors include: advanced age (>54men, >68 women);hypertension;dyslipidemia     Objective:    Today's Vitals   08/20/23 0814  BP: 120/62  Pulse: 67  SpO2: 98%  Weight: 130 lb 9.6 oz (59.2 kg)  Height: 5\' 4"  (1.626 m)   Body mass index is 22.42 kg/m.     08/20/2023    8:04 AM 03/20/2023    8:12 AM 10/13/2022    8:52 PM 10/05/2022    8:00 PM 10/05/2022    9:09 AM 04/19/2022    1:08 PM 03/05/2022    3:23 PM  Advanced Directives  Does Patient Have a Medical Advance Directive? Yes No No Yes Yes No Yes  Type of Estate agent of [place];Living will    Healthcare Power of [employer] of [place];Living will  Does patient want to make changes to medical advance directive? No - Patient declined  No - Patient declined No - Patient declined   No - Patient declined  Copy of Healthcare Power of Attorney in Chart? Yes - validated most recent copy scanned in chart (See row information)      Yes - validated most recent copy scanned in chart (See row information)  Would patient like information on creating a medical advance directive?  No - Patient declined No - Patient declined   No - Patient declined     Current Medications (verified) Outpatient Encounter Medications as of 08/20/2023  Medication Sig   acetaminophen (TYLENOL) 500 MG tablet Take 1,000 mg by mouth every 6 (six) hours as needed for moderate pain.   albuterol (VENTOLIN HFA) 108 (90 Base) MCG/ACT inhaler Inhale 2 puffs into the lungs every 4 (four) hours as needed for wheezing or shortness of breath.   alum & mag hydroxide-simeth (MAALOX/MYLANTA) 200-200-20 MG/5ML suspension  Take 15 mLs by mouth every 6 (six) hours as needed for indigestion or heartburn.   amLODipine (NORVASC) 10 MG tablet Take 1 tablet (10 mg total) by mouth daily.   benralizumab (FASENRA) 30 MG/ML prefilled syringe Inject 1 mL (30 mg total) into the skin every 8 (eight) weeks.   clopidogrel (PLAVIX) 75 MG tablet Take 1 tablet by mouth once daily   diclofenac Sodium (VOLTAREN) 1 % GEL Apply 4 g topically 4 (four) times daily. (Patient taking differently: Apply 4 g topically 4 (four) times daily as needed (pain).)   EPINEPHrine (EPIPEN 2-PAK) 0.3 mg/0.3 mL IJ SOAJ injection Use as directed for severe allergic reaction   EQ ALLERGY RELIEF, CETIRIZINE, 10 MG tablet Take 1 tablet by mouth once daily   fluticasone (FLONASE) 50 MCG/ACT nasal spray USE 1 SPRAY(S) IN EACH NOSTRIL IN THE MORNING AND AT BEDTIME   furosemide (LASIX) 40 MG tablet Take 1 tablet (40 mg total) by mouth daily. (Patient taking differently: Take 40 mg by mouth daily as needed for edema.)   ipratropium (ATROVENT) 0.06 % nasal spray Place 2 sprays into both nostrils 4 (four) times daily as needed (nasal drainage). (Patient taking differently: Place 2 sprays into both nostrils daily.)   levothyroxine (SYNTHROID) 50 MCG tablet TAKE 1 TABLET BY MOUTH ONCE DAILY BEFORE BREAKFAST   Magnesium Hydroxide (PHILLIPS MILK OF MAGNESIA PO) Take 15-30  mLs by mouth daily as needed (for constipation).   memantine (NAMENDA) 10 MG tablet Take 1 tablet by mouth twice daily   montelukast (SINGULAIR) 10 MG tablet TAKE 1 TABLET BY MOUTH AT BEDTIME   ondansetron (ZOFRAN-ODT) 4 MG disintegrating tablet Take 1 tablet (4 mg total) by mouth every 8 (eight) hours as needed for nausea or vomiting.   pantoprazole (PROTONIX) 40 MG tablet Take 1 tablet by mouth once daily   potassium chloride SA (KLOR-CON M) 20 MEQ tablet Take 2 tablets (40 mEq total) by mouth daily as needed. Take potasium days you take lasix.   pravastatin (PRAVACHOL) 80 MG tablet Take 1 tablet by  mouth once daily   SYMBICORT 80-4.5 MCG/ACT inhaler INHALE 2 PUFFS BY MOUTH IN THE MORNING AND AT BEDTIME (Patient taking differently: Inhale into the lungs See admin instructions. Inhale 2 puffs once a day, may take a second dose of 2 puffs when needed for shortness of breath)   Facility-Administered Encounter Medications as of 08/20/2023  Medication   Benralizumab SOSY 30 mg    Allergies (verified) Asa buff (mag [buffered aspirin], Ensure [nutritional supplements], Fish allergy, Other, Peanut-containing drug products, Penicillins, Shellfish-derived products, and Strawberry extract   History: Past Medical History:  Diagnosis Date   Arthritis    Asthma    Bowel obstruction (HCC)    Cancer (HCC)    Environmental allergies    GERD (gastroesophageal reflux disease)    Glaucoma 05/09/2017   H. pylori infection    History of radiation therapy 07/05/20-08/05/20   Left lung IMRT Dr. Roselind Messier    HLD (hyperlipidemia) 01/16/2019   Hypertension    Iron deficiency anemia    Osteopenia 05/26/2017   Thyroid disease    Urticaria 07/25/2018   Past Surgical History:  Procedure Laterality Date   ABDOMINAL HYSTERECTOMY     BOWEL RESECTION N/A 05/23/2020   Procedure: SMALL BOWEL REPAIR;  Surgeon: Sheliah Hatch, De Blanch, MD;  Location: [facility];  Service: General;  Laterality: N/A;   BREAST BIOPSY     BRONCHIAL BIOPSY  06/10/2020   Procedure: BRONCHIAL BIOPSIES;  Surgeon: [name], DO;  Location: [facility];  Service: Pulmonary;;   BRONCHIAL BRUSHINGS  06/10/2020   Procedure: BRONCHIAL BRUSHINGS;  Surgeon: [name], DO;  Location: [facility];  Service: Pulmonary;;   BRONCHIAL NEEDLE ASPIRATION BIOPSY  06/10/2020   Procedure: BRONCHIAL NEEDLE ASPIRATION BIOPSIES;  Surgeon: [name], DO;  [facility]: [facility];  Service: Pulmonary;;   BRONCHIAL WASHINGS  06/10/2020   Procedure: BRONCHIAL WASHINGS;  Surgeon: [name], DO;  [facility];  Service: Pulmonary;;   COLON  SURGERY     DIAGNOSTIC LARYNGOSCOPY N/A 05/23/2020   Procedure: ATTEMPTED DIAGNOSTIC LAPAROSCOPY WITH ADHESIONS;  Surgeon: Sheliah Hatch, De Blanch, MD;  Location: [facility];  Service: General;  Laterality: N/A;   IR IMAGING GUIDED PORT INSERTION  07/15/2020   IR REMOVAL TUN ACCESS W/ PORT W/O FL MOD SED  01/16/2023   KNEE SURGERY     LAPAROTOMY N/A 04/18/2015   Procedure: Exploratory laparotomy with lysis of adhesions, possible bowel resection;  Surgeon: Axel Filler, MD;  Location: [facility];  Service: General;  Laterality: N/A;   LAPAROTOMY N/A 05/23/2020   Procedure: EXPLORATORY LAPAROTOMY;  Surgeon: Rodman Pickle, MD;  Location: [facility];  Service: General;  Laterality: N/A;   LYSIS OF ADHESION N/A 05/23/2020   Procedure: LYSIS OF ADHESION;  Surgeon: Sheliah Hatch De Blanch, MD;  Location[facility];  Service: General;  Laterality:  N/A;   NASAL SINUS SURGERY     THORACENTESIS Left 12/03/2022   Procedure: THORACENTESIS;  Surgeon: [name], DO;  [facility];  Service: Pulmonary;  Laterality: Left;   THYROID SURGERY     VIDEO BRONCHOSCOPY WITH ENDOBRONCHIAL NAVIGATION Bilateral 06/10/2020   Procedure: VIDEO BRONCHOSCOPY WITH ENDOBRONCHIAL NAVIGATION;  Surgeon: [name], DO;  [facility];  Service: Pulmonary;  Laterality: Bilateral;   VIDEO BRONCHOSCOPY WITH ENDOBRONCHIAL ULTRASOUND  06/10/2020   Procedure: VIDEO BRONCHOSCOPY WITH ENDOBRONCHIAL ULTRASOUND;  Surgeon: [name], DO;  [facility];  Service: Pulmonary;;   Family History  Problem Relation Age of Onset   Diabetes Mother    Hypertension Mother    Hypertension Father    Glaucoma Sister    Glaucoma Brother    Allergic rhinitis Neg Hx    Angioedema Neg Hx    Asthma Neg Hx    Eczema Neg Hx    Immunodeficiency Neg Hx    Urticaria Neg Hx    BRCA 1/2 Neg Hx    Breast cancer Neg Hx    Social History   Socioeconomic History   Marital status: Widowed    Spouse name: Not on file   Number  of children: 4   Years of education: 55   Highest education level: 8th grade  Occupational History   Occupation: retired  Tobacco Use   Smoking status: Former    Current packs/day: 0.00    Types: Cigarettes    Quit date: 05/21/1970    Years since quitting: 53.2    Passive exposure: Past   Smokeless tobacco: Never   Tobacco comments:    quit 60 years ago  Vaping Use   Vaping status: Never Used  Substance and Sexual Activity   Alcohol use: No   Drug use: No   Sexual activity: Never  Other Topics Concern   Not on file  Social History Narrative   Right handed   Caffeine on occ   One story home   Social Drivers of Health   Financial Resource Strain: Low Risk  (08/20/2023)   Overall Financial Resource Strain (CARDIA)    Difficulty of Paying Living Expenses: Not hard at all  Food Insecurity: No Food Insecurity (08/20/2023)   Hunger Vital Sign    Worried About Running Out of Food in the Last Year: Never true    Ran Out of Food in the Last Year: Never true  Transportation Needs: No Transportation Needs (08/20/2023)   PRAPARE - Administrator, Civil Service (Medical): No    Lack of Transportation (Non-Medical): No  Physical Activity: Insufficiently Active (08/20/2023)   Exercise Vital Sign    Days of Exercise per Week: 5 days    Minutes of Exercise per Session: 20 min  Stress: No Stress Concern Present (08/20/2023)   [employer] of Occupational Health - Occupational Stress Questionnaire    Feeling of Stress : Not at all  Social Connections: Moderately Integrated (08/20/2023)   Social Connection and Isolation Panel [NHANES]    Frequency of Communication with Friends and Family: More than three times a week    Frequency of Social Gatherings with Friends and Family: More than three times a week    Attends Religious Services: More than 4 times per year    Active Member of [employer] or Organizations: Yes    Attends Banker Meetings: More than 4 times per year     Marital Status: Widowed  Tobacco Counseling Counseling given: No Tobacco comments: quit 60 years ago    Clinical Intake:  Pre-visit preparation completed: Yes  Pain : No/denies pain     BMI - recorded: 22.42 Nutritional Status: BMI of 19-24  Normal Nutritional Risks: None Diabetes: No  Lab Results  Component Value Date   HGBA1C 5.8 07/18/2021   HGBA1C 5.8 05/03/2020   HGBA1C 5.8 11/20/2019     How often do you need to have someone help you when you read instructions, pamphlets, or other written materials from your doctor or pharmacy?: 1 - Never  Interpreter Needed?: No  Information entered by :: Hassell Halim, CMA   Activities of Daily Living     08/20/2023    8:34 AM 10/05/2022    8:00 PM  In your present state of health, do you have any difficulty performing the following activities:  Hearing? 0 0  Vision? 0 0  Difficulty concentrating or making decisions? 0 1  Walking or climbing stairs? 0 1  Dressing or bathing? 0 0  Doing errands, shopping? 0 0  Preparing Food and eating ? N   Using the Toilet? N   In the past six months, have you accidently leaked urine? Y   Comment wears a pantyliner   Do you have problems with loss of bowel control? N   Managing your Medications? N   Managing your Finances? N   Housekeeping or managing your Housekeeping? N     Patient Care Team: [name], MD as PCP - General (Internal Medicine) Syliva Overman, RN as Oncology Nurse Navigator (Oncology) Si Gaul, MD as Consulting Physician (Oncology) Marcelyn Bruins, MD as Consulting Physician (Allergy) Tonia Brooms Rachel Bo, DO as Consulting Physician (Pulmonary Disease) Micki Riley, MD as Consulting Physician (Neurology) Wenda Overland, [place] as [facility] Management Mateo Flow, MD as Consulting Physician (Ophthalmology)  Indicate any recent Medical Services you may have received from other than [facility] providers in the past year (date may be  approximate).     Assessment:   This is a routine wellness examination for [place].  Hearing/Vision screen Hearing Screening - Comments:: Denies hearing difficulties   Vision Screening - Comments:: Wears rx glasses - up to date with routine eye exams with  Dr Elmer Picker   Goals Addressed               This Visit's Progress     Patient Stated (pt-stated)        Patient stated she plans to stay active.       Depression Screen     08/20/2023    8:25 AM 06/17/2023    9:28 AM 02/13/2023    8:04 AM 11/14/2022   10:26 AM 11/06/2022   10:04 AM 11/02/2022    3:32 PM 10/22/2022   10:43 AM  PHQ 2/9 Scores  PHQ - 2 Score 0 0 0 0 0 2 0  PHQ- 9 Score 0   0 0 5     Fall Risk     08/20/2023    8:18 AM 06/17/2023    9:27 AM 02/13/2023    8:04 AM 11/14/2022   10:26 AM 11/06/2022   10:04 AM  Fall Risk   Falls in the past year? 0 0 0 0 0  Number falls in past yr: 0 0 0 0 0  Injury with Fall? 0 0 0 0 0  Risk for fall due to : No Fall Risks No Fall Risks No Fall Risks No  Fall Risks No Fall Risks  Follow up Falls prevention discussed;Falls evaluation completed Falls evaluation completed Falls evaluation completed Falls evaluation completed Falls evaluation completed    MEDICARE RISK AT HOME:  Medicare Risk at Home Any stairs in or around the home?: No If so, are there any without handrails?: No Home free of loose throw rugs in walkways, pet beds, electrical cords, etc?: Yes Adequate lighting in your home to reduce risk of falls?: Yes Life alert?: No Use of a cane, walker or w/c?: No Grab bars in the bathroom?: No Shower chair or bench in shower?: No Elevated toilet seat or a handicapped toilet?: No  TIMED UP AND GO:  Was the test performed?  No  Cognitive Function: 6CIT completed    04/10/2023   10:51 AM 10/01/2022    3:16 PM 04/17/2022    1:33 PM 03/05/2022    3:30 PM 08/07/2021   11:18 AM  MMSE - Mini Mental State Exam  Not completed:    Unable to complete   Orientation to time  2 1 0  2  Orientation to Place 4 4 5  5   Registration 3 3 3  3   Attention/ Calculation 0 0 0  0  Recall 0 0 0  0  Language- name 2 objects 2 2 2  2   Language- repeat 0 0 1  1  Language- follow 3 step command 3 2 3  3   Language- read & follow direction 1 1 1  1   Write a sentence 0 0 0  1  Copy design 0 1 0  0  Copy design-comments     Four-legged animals x 1 min: 11  Total score 15 14 15  18       07/20/2021    8:00 AM  Montreal Cognitive Assessment   Visuospatial/ Executive (0/5) 1  Naming (0/3) 3  Attention: Read list of digits (0/2) 2  Attention: Read list of letters (0/1) 1  Attention: Serial 7 subtraction starting at 100 (0/3) 0  Language: Repeat phrase (0/2) 1  Language : Fluency (0/1) 0  Abstraction (0/2) 1  Delayed Recall (0/5) 0  Orientation (0/6) 1  Total 10  Adjusted Score (based on education) 11      08/20/2023    8:22 AM 03/05/2022    3:30 PM  6CIT Screen  What Year? 0 points 0 points  What month? 0 points 0 points  What time? 3 points 0 points  Count back from 20 4 points 0 points  Months in reverse 4 points 0 points  Repeat phrase 10 points 0 points  Total Score 21 points 0 points    Immunizations Immunization History  Administered Date(s) Administered   Fluad Quad(high Dose 65+) 01/16/2019, 03/05/2020, 02/01/2021, 02/26/2022   Fluad Trivalent(High Dose 65+) 02/13/2023   Influenza, High Dose Seasonal PF 01/30/2018   PFIZER Comirnaty(Gray Top)Covid-19 Tri-Sucrose Vaccine 02/23/2022   PFIZER(Purple Top)SARS-COV-2 Vaccination 07/13/2019, 08/03/2019, 02/16/2020   Pneumococcal Conjugate-13 11/08/2017    Screening Tests Health Maintenance  Topic Date Due   DTaP/Tdap/Td (1 - Tdap) Never done   Zoster Vaccines- Shingrix (1 of 2) Never done   Pneumonia Vaccine 28+ Years old (2 of 2 - PPSV23 or PCV20) 06/16/2024 (Originally 01/03/2018)   INFLUENZA VACCINE  12/20/2023   Medicare Annual Wellness (AWV)  08/19/2024   DEXA SCAN  Completed   HPV VACCINES  Aged  Out   COVID-19 Vaccine  Discontinued    Health Maintenance  Health Maintenance Due  Topic Date  Due   DTaP/Tdap/Td (1 - Tdap) Never done   Zoster Vaccines- Shingrix (1 of 2) Never done   Health Maintenance Items Addressed: 08/20/2023   Additional Screening:  Vision Screening: Recommended annual ophthalmology exams for early detection of glaucoma and other disorders of the eye.  Dental Screening: Recommended annual dental exams for proper oral hygiene  Community Resource Referral / Chronic Care Management: CRR required this visit?  No   CCM required this visit?  No     Plan:     I have personally reviewed and noted the following in the patient's chart:   Medical and social history Use of alcohol, tobacco or illicit drugs  Current medications and supplements including opioid prescriptions. Patient is not currently taking opioid prescriptions. Functional ability and status Nutritional status Physical activity Advanced directives List of other physicians Hospitalizations, surgeries, and ER visits in previous 12 months Vitals Screenings to include cognitive, depression, and falls Referrals and appointments  In addition, I have reviewed and discussed with patient certain preventive protocols, quality metrics, and best practice recommendations. A written personalized care plan for preventive services as well as general preventive health recommendations were provided to patient.     Darreld Mclean, CMA   08/20/2023   After Visit Summary: (In Person-Printed) AVS printed and given to the patient  Notes: Nothing significant to report at this time.

## 2023-08-27 ENCOUNTER — Ambulatory Visit: Admitting: Podiatry

## 2023-08-27 ENCOUNTER — Encounter: Payer: Self-pay | Admitting: Podiatry

## 2023-08-27 VITALS — Ht 64.0 in | Wt 130.6 lb

## 2023-08-27 DIAGNOSIS — M205X2 Other deformities of toe(s) (acquired), left foot: Secondary | ICD-10-CM | POA: Diagnosis not present

## 2023-08-27 DIAGNOSIS — L84 Corns and callosities: Secondary | ICD-10-CM

## 2023-08-27 DIAGNOSIS — L97521 Non-pressure chronic ulcer of other part of left foot limited to breakdown of skin: Secondary | ICD-10-CM

## 2023-08-27 MED ORDER — GENTAMICIN SULFATE 0.1 % EX OINT: 1.0000 | TOPICAL_OINTMENT | Freq: Every day | CUTANEOUS | 0 refills | Status: DC

## 2023-08-27 NOTE — Progress Notes (Unsigned)
 Chief Complaint  Patient presents with   Callouses    Pt is here due to pain in left pinky toe, has a corn/ callus in between toe and on the toe.   HPI: 83 y.o. female presents today with concern of a painful corn between the 4th and 5th toes as well as on the outside of the fifth toe on the left foot.  Past Medical History:  Diagnosis Date   Arthritis    Asthma    Bowel obstruction (HCC)    Cancer (HCC)    Environmental allergies    GERD (gastroesophageal reflux disease)    Glaucoma 05/09/2017   H. pylori infection    History of radiation therapy 07/05/20-08/05/20   Left lung IMRT Dr. Roselind Messier    HLD (hyperlipidemia) 01/16/2019   Hypertension    Iron deficiency anemia    Osteopenia 05/26/2017   Thyroid disease    Urticaria 07/25/2018    Past Surgical History:  Procedure Laterality Date   ABDOMINAL HYSTERECTOMY     BOWEL RESECTION N/A 05/23/2020   Procedure: SMALL BOWEL REPAIR;  Surgeon: Sheliah Hatch, De Blanch, MD;  Location: [facility];  Service: General;  Laterality: N/A;   BREAST BIOPSY     BRONCHIAL BIOPSY  06/10/2020   Procedure: BRONCHIAL BIOPSIES;  Surgeon: [name], DO;  [facility]: [facility];  Service: Pulmonary;;   BRONCHIAL BRUSHINGS  06/10/2020   Procedure: BRONCHIAL BRUSHINGS;  Surgeon: [name], DO;  [facility];  Service: Pulmonary;;   BRONCHIAL NEEDLE ASPIRATION BIOPSY  06/10/2020   Procedure: BRONCHIAL NEEDLE ASPIRATION BIOPSIES;  Surgeon: [name], DO;  [facility];  Service: Pulmonary;;   BRONCHIAL WASHINGS  06/10/2020   Procedure: BRONCHIAL WASHINGS;  Surgeon: [name], DO;  [facility];  Service: Pulmonary;;   COLON SURGERY     DIAGNOSTIC LARYNGOSCOPY N/A 05/23/2020   Procedure: ATTEMPTED DIAGNOSTIC LAPAROSCOPY WITH ADHESIONS;  Surgeon: Sheliah Hatch, De Blanch, MD;  [facility];  Service: General;  Laterality: N/A;   IR IMAGING GUIDED PORT INSERTION  07/15/2020   IR REMOVAL TUN ACCESS W/ PORT W/O  FL MOD SED  01/16/2023   KNEE SURGERY     LAPAROTOMY N/A 04/18/2015   Procedure: Exploratory laparotomy with lysis of adhesions, possible bowel resection;  Surgeon: Axel Filler, MD;  [facility];  Service: General;  Laterality: N/A;   LAPAROTOMY N/A 05/23/2020   Procedure: EXPLORATORY LAPAROTOMY;  Surgeon: Rodman Pickle, MD;  [facility];  Service: General;  Laterality: N/A;   LYSIS OF ADHESION N/A 05/23/2020   Procedure: LYSIS OF ADHESION;  Surgeon: Sheliah Hatch De Blanch, MD;  Location: [facility] OR;  Service: General;  Laterality: N/A;   NASAL SINUS SURGERY     THORACENTESIS Left 12/03/2022   Procedure: THORACENTESIS;  Surgeon: [name], DO;  [facility];  Service: Pulmonary;  Laterality: Left;   THYROID SURGERY     VIDEO BRONCHOSCOPY WITH ENDOBRONCHIAL NAVIGATION Bilateral 06/10/2020   Procedure: VIDEO BRONCHOSCOPY WITH ENDOBRONCHIAL NAVIGATION;  Surgeon: [name], DO;  [facility];  Service: Pulmonary;  Laterality: Bilateral;   VIDEO BRONCHOSCOPY WITH ENDOBRONCHIAL ULTRASOUND  06/10/2020   Procedure: VIDEO BRONCHOSCOPY WITH ENDOBRONCHIAL ULTRASOUND;  Surgeon: [name], DO;  [facility];  Service: Pulmonary;;    Allergies  Allergen Reactions   Asa Buff (Mag [Buffered Aspirin] Other (See Comments)    Excessive sweating   Ensure [Nutritional Supplements]     Or boost -  upset stomach    Fish Allergy Other (See Comments)    Unknown reaction, but allergic   Other Other (See Comments)    Gelcaps; gel-containing capsules; extended-release medication - upset stomach    Peanut-Containing Drug Products Other (See Comments)    From allergy test   Penicillins Itching   Shellfish-Derived Products Other (See Comments)    From allergy test   Strawberry Extract Nausea And Vomiting and Other (See Comments)    From allergy test      Physical Exam: Trace palpable pedal pulses noted.  No significant edema or erythema is noted.  No  calor is noted.  Left 4th and 5th toe have slight adductovarus rotation which is manually reducible.  There is a hyperkeratotic lesion on the dorsal lateral aspect of the left fifth toe.  There is a soft hyperkeratotic lesion in the left fourth interspace.  Upon shaving of the interdigital corn there is a superficial ulceration noted approximately 2 mm in diameter.  No purulence expressed.  Assessment/Plan of Care: 1. Corns   2. Curly toe, acquired, left   3. Toe ulcer, left, limited to breakdown of skin (HCC)      Meds ordered this encounter  Medications   gentamicin ointment (GARAMYCIN) 0.1 %    Sig: Apply 1 Application topically daily. Apply to wound daily between toes.  Use a very small amount.    Dispense:  30 g    Refill:  0   Discussed clinical findings with patient today.  The corns were shaved with sterile #313 blade.  Antibiotic ointment was applied to the fourth interdigital space.  Prescription for gentamicin ointment sent to the patient's pharmacy and she will apply this to the superficial ulcer in the fourth interspace once daily.  She was given some toe spacers to wear to keep pressure off of the toes so that they do not continue to rub.  Follow-up in 2 weeks for recheck and to perform her routine footcare    Wenceslaus Gist D. Denver Harder, DPM, FACFAS [facility]     [address] [place], [place] [address]                Office [phone]  Fax [phone]

## 2023-09-05 ENCOUNTER — Ambulatory Visit: Payer: Medicare Other | Admitting: Primary Care

## 2023-09-05 ENCOUNTER — Encounter: Payer: Self-pay | Admitting: Primary Care

## 2023-09-05 ENCOUNTER — Encounter: Admitting: *Deleted

## 2023-09-05 ENCOUNTER — Encounter: Payer: Self-pay | Admitting: *Deleted

## 2023-09-10 ENCOUNTER — Encounter: Payer: Self-pay | Admitting: Podiatry

## 2023-09-10 ENCOUNTER — Ambulatory Visit (INDEPENDENT_AMBULATORY_CARE_PROVIDER_SITE_OTHER): Admitting: Podiatry

## 2023-09-10 DIAGNOSIS — M79675 Pain in left toe(s): Secondary | ICD-10-CM

## 2023-09-10 DIAGNOSIS — M79674 Pain in right toe(s): Secondary | ICD-10-CM

## 2023-09-10 DIAGNOSIS — L97521 Non-pressure chronic ulcer of other part of left foot limited to breakdown of skin: Secondary | ICD-10-CM

## 2023-09-10 DIAGNOSIS — B351 Tinea unguium: Secondary | ICD-10-CM

## 2023-09-10 NOTE — Progress Notes (Unsigned)
 Subjective:  Patient ID: Angelica [name], female    DOB: Jul 10, 1940,  MRN: [id number]  Angelica [name] presents to clinic today for:  Chief Complaint  Patient presents with   Nail Problem    Return in about 2 weeks (around 09/10/2023) for [facility] & recheck corn L 4th interspace.   Patient notes nails are thick, discolored, elongated and painful in shoegear when trying to ambulate.  She is also here for follow-up of a left fourth interdigital space ulceration.  She did not get the prescription topical antibiotic ointment because it was $60 even with her co-pay.  They have not been applying anything between the toes.  They did state that they notify the office of the cost and ask for any suggestions but this message did not seem to get relayed to me in order to respond.  Apologized for the miscommunication today.  PCP is Roslyn Coombe, MD.  Past Medical History:  Diagnosis Date   Arthritis    Asthma    Bowel obstruction (HCC)    Cancer (HCC)    Environmental allergies    GERD (gastroesophageal reflux disease)    Glaucoma 05/09/2017   H. pylori infection    History of radiation therapy 07/05/20-08/05/20   Left lung IMRT Dr. Eloise Hake    HLD (hyperlipidemia) 01/16/2019   Hypertension    Iron  deficiency anemia    Osteopenia 05/26/2017   Thyroid  disease    Urticaria 07/25/2018    Past Surgical History:  Procedure Laterality Date   ABDOMINAL HYSTERECTOMY     BOWEL RESECTION N/A 05/23/2020   Procedure: SMALL BOWEL REPAIR;  Surgeon: Dorrie Gaudier Alphonso Aschoff, MD;  [facility];  Service: General;  Laterality: N/A;   BREAST BIOPSY     BRONCHIAL BIOPSY  06/10/2020   Procedure: BRONCHIAL BIOPSIES;  Surgeon: Prudy Brownie, DO;  [facility];  Service: Pulmonary;;   BRONCHIAL BRUSHINGS  06/10/2020   Procedure: BRONCHIAL BRUSHINGS;  Surgeon: Prudy Brownie, DO;  [facility];  Service: Pulmonary;;   BRONCHIAL NEEDLE ASPIRATION BIOPSY  06/10/2020   Procedure: BRONCHIAL  NEEDLE ASPIRATION BIOPSIES;  Surgeon: Prudy Brownie, DO;  [facility];  Service: Pulmonary;;   BRONCHIAL WASHINGS  06/10/2020   Procedure: BRONCHIAL WASHINGS;  Surgeon: Prudy Brownie, DO;  [facility];  Service: Pulmonary;;   COLON SURGERY     DIAGNOSTIC LARYNGOSCOPY N/A 05/23/2020   Procedure: ATTEMPTED DIAGNOSTIC LAPAROSCOPY WITH ADHESIONS;  Surgeon: Dorrie Gaudier, Alphonso Aschoff, MD;  [facility];  Service: General;  Laterality: N/A;   IR IMAGING GUIDED PORT INSERTION  07/15/2020   IR REMOVAL TUN ACCESS W/ PORT W/O FL MOD SED  01/16/2023   KNEE SURGERY     LAPAROTOMY N/A 04/18/2015   Procedure: Exploratory laparotomy with lysis of adhesions, possible bowel resection;  Surgeon: Shela Derby, MD;  [facility];  Service: General;  Laterality: N/A;   LAPAROTOMY N/A 05/23/2020   Procedure: EXPLORATORY LAPAROTOMY;  Surgeon: Derral Flick, MD;  [facility];  Service: General;  Laterality: N/A;   LYSIS OF ADHESION N/A 05/23/2020   Procedure: LYSIS OF ADHESION;  Surgeon: Dorrie Gaudier Alphonso Aschoff, MD;  Location: [facility] OR;  Service: General;  Laterality: N/A;   NASAL SINUS SURGERY     THORACENTESIS Left 12/03/2022   Procedure: THORACENTESIS;  Surgeon: Prudy Brownie, DO;  [facility];  Service: Pulmonary;  Laterality: Left;   THYROID  SURGERY     VIDEO BRONCHOSCOPY WITH  ENDOBRONCHIAL NAVIGATION Bilateral 06/10/2020   Procedure: VIDEO BRONCHOSCOPY WITH ENDOBRONCHIAL NAVIGATION;  Surgeon: Prudy Brownie, DO;  [facility];  Service: Pulmonary;  Laterality: Bilateral;   VIDEO BRONCHOSCOPY WITH ENDOBRONCHIAL ULTRASOUND  06/10/2020   Procedure: VIDEO BRONCHOSCOPY WITH ENDOBRONCHIAL ULTRASOUND;  Surgeon: Prudy Brownie, DO;  [facility];  Service: Pulmonary;;    Allergies  Allergen Reactions   Asa Buff (Mag [Buffered Aspirin] Other (See Comments)    Excessive sweating   Ensure [Nutritional Supplements]     Or boost - upset stomach    Fish  Allergy Other (See Comments)    Unknown reaction, but allergic   Other Other (See Comments)    Gelcaps; gel-containing capsules; extended-release medication - upset stomach    Peanut-Containing Drug Products Other (See Comments)    From allergy test   Penicillins Itching   Shellfish-Derived Products Other (See Comments)    From allergy test   Strawberry Extract Nausea And Vomiting and Other (See Comments)    From allergy test     Review of Systems: Negative except as noted in the HPI.  Objective:  Angelica [name] is a pleasant 83 y.o. female in NAD. AAO x 3.  Vascular Examination: Capillary refill time is 3-5 seconds to toes bilateral. Palpable pedal pulses b/l LE. Digital hair present b/l.  Skin temperature gradient WNL b/l. No varicosities b/l. No cyanosis noted b/l.   Dermatological Examination: Pedal skin with normal turgor, texture and tone b/l. No open wounds. No interdigital macerations b/l. Toenails x10 are 3mm thick, discolored, dystrophic with subungual debris. There is pain with compression of the nail plates.  They are elongated x10.  The ulceration in the left fourth interspace appears closed at this time.  There is no surrounding erythema or drainage noted.  No clinical signs of infection are seen.  Assessment/Plan: 1. Pain due to onychomycosis of toenails of both feet   2. Toe ulcer, left, limited to breakdown of skin (HCC)    The mycotic toenails were sharply debrided x10 with sterile nail nippers and a power debriding burr to decrease bulk/thickness and length.    In the area of previous ulceration the keratotic tissue was shaved with sterile #313 blade uneventfully.  She can continue with toe spacers and she did note that she got wider shoes.  Hopefully this will prevent recurrence of the ulceration.   Return in about 3 months (around 12/10/2023) for RFC.   Joe Murders, DPM, FACFAS [facility]     [address] [place], [place] [address]                Office [phone]  Fax [phone]

## 2023-09-25 ENCOUNTER — Ambulatory Visit

## 2023-09-25 DIAGNOSIS — J4550 Severe persistent asthma, uncomplicated: Secondary | ICD-10-CM

## 2023-10-03 ENCOUNTER — Telehealth: Payer: Self-pay | Admitting: *Deleted

## 2023-10-03 ENCOUNTER — Other Ambulatory Visit: Payer: Self-pay | Admitting: Allergy

## 2023-10-03 NOTE — Progress Notes (Signed)
 Complex Care Management Care Guide Note  10/03/2023 Name: Angelica [name] MRN: [id number] DOB: Jul 22, 1940  Angelica [name] is a 83 y.o. year old female who is a primary care patient of Roslyn Coombe, MD and is actively engaged with the care management team. I reached out to Jeanne Milliner by phone today to assist with re-scheduling  with the Licensed Clinical Child psychotherapist.  Follow up plan: daughter declined to reschedule at this time - says she will call back is still looking into resources sent by LCSW   Kandis Ormond, CMA [facility]  [facility], [facility] Guide Direct Dial: [phone]  Fax: [phone] Website: [email or web address]

## 2023-10-09 ENCOUNTER — Emergency Department (HOSPITAL_COMMUNITY)
Admission: EM | Admit: 2023-10-09 | Discharge: 2023-10-09 | Disposition: A | Discharge status: Home / Self Care | Attending: Emergency Medicine | Admitting: Emergency Medicine

## 2023-10-09 ENCOUNTER — Encounter (HOSPITAL_COMMUNITY): Payer: Self-pay

## 2023-10-09 ENCOUNTER — Emergency Department (HOSPITAL_COMMUNITY)

## 2023-10-09 DIAGNOSIS — R1084 Generalized abdominal pain: Secondary | ICD-10-CM | POA: Insufficient documentation

## 2023-10-09 DIAGNOSIS — N8110 Cystocele, unspecified: Secondary | ICD-10-CM | POA: Diagnosis not present

## 2023-10-09 DIAGNOSIS — C3490 Malignant neoplasm of unspecified part of unspecified bronchus or lung: Secondary | ICD-10-CM | POA: Diagnosis not present

## 2023-10-09 DIAGNOSIS — K219 Gastro-esophageal reflux disease without esophagitis: Secondary | ICD-10-CM | POA: Diagnosis not present

## 2023-10-09 DIAGNOSIS — I10 Essential (primary) hypertension: Secondary | ICD-10-CM | POA: Diagnosis not present

## 2023-10-09 DIAGNOSIS — J45909 Unspecified asthma, uncomplicated: Secondary | ICD-10-CM | POA: Insufficient documentation

## 2023-10-09 DIAGNOSIS — Z85118 Personal history of other malignant neoplasm of bronchus and lung: Secondary | ICD-10-CM | POA: Insufficient documentation

## 2023-10-09 DIAGNOSIS — R109 Unspecified abdominal pain: Secondary | ICD-10-CM | POA: Diagnosis not present

## 2023-10-09 DIAGNOSIS — N816 Rectocele: Secondary | ICD-10-CM | POA: Diagnosis not present

## 2023-10-09 LAB — URINALYSIS, W/ REFLEX TO CULTURE (INFECTION SUSPECTED)
Bacteria, UA: NONE SEEN
Bilirubin Urine: NEGATIVE
Glucose, UA: NEGATIVE mg/dL
Hgb urine dipstick: NEGATIVE
Ketones, ur: NEGATIVE mg/dL
Leukocytes,Ua: NEGATIVE
Nitrite: NEGATIVE
Protein, ur: NEGATIVE mg/dL
Specific Gravity, Urine: 1.002 — ABNORMAL LOW (ref 1.005–1.030)
pH: 6 (ref 5.0–8.0)

## 2023-10-09 LAB — COMPREHENSIVE METABOLIC PANEL WITH GFR
ALT: 16 U/L (ref 0–44)
AST: 25 U/L (ref 15–41)
Albumin: 3.7 g/dL (ref 3.5–5.0)
Alkaline Phosphatase: 71 U/L (ref 38–126)
Anion gap: 6 (ref 5–15)
BUN: 12 mg/dL (ref 8–23)
CO2: 24 mmol/L (ref 22–32)
Calcium: 8.9 mg/dL (ref 8.9–10.3)
Chloride: 105 mmol/L (ref 98–111)
Creatinine, Ser: 0.81 mg/dL (ref 0.44–1.00)
GFR, Estimated: >60 mL/min (ref >60)
Glucose, Bld: 97 mg/dL (ref 70–99)
Potassium: 3.4 mmol/L — ABNORMAL LOW (ref 3.5–5.1)
Sodium: 135 mmol/L (ref 135–145)
Total Bilirubin: 0.8 mg/dL (ref 0.0–1.2)
Total Protein: 6.9 g/dL (ref 6.5–8.1)

## 2023-10-09 LAB — CBC WITH DIFFERENTIAL/PLATELET
Abs Immature Granulocytes: 0.01 10*3/uL (ref 0.00–0.07)
Basophils Absolute: 0 10*3/uL (ref 0.0–0.1)
Basophils Relative: 1 %
Eosinophils Absolute: 0 10*3/uL (ref 0.0–0.5)
Eosinophils Relative: 0 %
HCT: 37.3 % (ref 36.0–46.0)
Hemoglobin: 12.2 g/dL (ref 12.0–15.0)
Immature Granulocytes: 0 %
Lymphocytes Relative: 29 %
Lymphs Abs: 1.3 10*3/uL (ref 0.7–4.0)
MCH: 30.1 pg (ref 26.0–34.0)
MCHC: 32.7 g/dL (ref 30.0–36.0)
MCV: 92.1 fL (ref 80.0–100.0)
Monocytes Absolute: 0.4 10*3/uL (ref 0.1–1.0)
Monocytes Relative: 8 %
Neutro Abs: 2.7 10*3/uL (ref 1.7–7.7)
Neutrophils Relative %: 62 %
Platelets: 162 10*3/uL (ref 150–400)
RBC: 4.05 MIL/uL (ref 3.87–5.11)
RDW: 13.9 % (ref 11.5–15.5)
WBC: 4.4 10*3/uL (ref 4.0–10.5)
nRBC: 0 % (ref 0.0–0.2)

## 2023-10-09 LAB — LIPASE, BLOOD: Lipase: 30 U/L (ref 11–51)

## 2023-10-09 MED ORDER — IOHEXOL 300 MG/ML  SOLN
100.0000 mL | Freq: Once | INTRAMUSCULAR | Status: AC | PRN
  Administered 2023-10-09: 100 mL via INTRAVENOUS

## 2023-10-09 MED ORDER — SODIUM CHLORIDE 0.9 % IV BOLUS
500.0000 mL | Freq: Once | INTRAVENOUS | Status: AC
  Administered 2023-10-09: 500 mL via INTRAVENOUS

## 2023-10-09 NOTE — ED Provider Notes (Signed)
 Emergency Department Provider Note   I have reviewed the triage vital signs and the nursing notes.   HISTORY  Chief Complaint Flank Pain   HPI Angelica [name] is a 83 y.o. female with past history reviewed below including prior bowel obstruction presents to the department with right side abdominal pain.  Patient recalls feeling pain today moderate bedside states that she suffers from dementia and actually appears to have been level of discomfort over the past couple of weeks.  No vomiting, diarrhea, severe constipation.  No fevers.  No chest pain. No UTI symptoms. Patient with prior history of non-small cell lung cancer not currently on treatment.   Level 5 caveat: Dementia    Past Medical History:  Diagnosis Date   Arthritis    Asthma    Bowel obstruction (HCC)    Cancer (HCC)    Environmental allergies    GERD (gastroesophageal reflux disease)    Glaucoma 05/09/2017   H. pylori infection    History of radiation therapy 07/05/20-08/05/20   Left lung IMRT Dr. Eloise Hake    HLD (hyperlipidemia) 01/16/2019   Hypertension    Iron  deficiency anemia    Osteopenia 05/26/2017   Thyroid  disease    Urticaria 07/25/2018    Review of Systems  Constitutional: No fever/chills Cardiovascular: Denies chest pain. Respiratory: Denies shortness of breath. Gastrointestinal: Right side abdominal pain.  No nausea, no vomiting.  No diarrhea.  No constipation. Genitourinary: Negative for dysuria. Musculoskeletal: Negative for back pain. Skin: Negative for rash. Neurological: Negative for headaches, focal weakness or numbness.  ____________________________________________   PHYSICAL EXAM:  VITAL SIGNS: ED Triage Vitals  Encounter Vitals Group     BP 10/09/23 0958 (!) 166/84     Pulse Rate 10/09/23 0958 62     Resp 10/09/23 0958 17     Temp 10/09/23 0958 98 F (36.7 C)     Temp Source 10/09/23 0958 Oral     SpO2 10/09/23 0958 100 %     Weight 10/09/23 1012 130 lb (59 kg)      Height 10/09/23 1012 5\' 4"  (1.626 m)   Constitutional: Alert with mild confusion Well appearing and in no acute distress. Eyes: Conjunctivae are normal.  Head: Atraumatic. Nose: No congestion/rhinnorhea. Mouth/Throat: Mucous membranes are moist. Neck: No stridor.   Cardiovascular: Normal rate, regular rhythm. Good peripheral circulation. Grossly normal heart sounds.   Respiratory: Normal respiratory effort.  No retractions. Lungs CTAB. Gastrointestinal: Soft with mild right abdominal tenderness. No peritonitis. No distention.  Musculoskeletal: No lower extremity tenderness nor edema. No gross deformities of extremities. Neurologic:  Normal speech and language. No gross focal neurologic deficits are appreciated.  Skin:  Skin is warm, dry and intact. No rash noted.  ____________________________________________   LABS (all labs ordered are listed, but only abnormal results are displayed)  Labs Reviewed  COMPREHENSIVE METABOLIC PANEL WITH GFR - Abnormal; Notable for the following components:      Result Value   Potassium 3.4 (*)    All other components within normal limits  URINALYSIS, W/ REFLEX TO CULTURE (INFECTION SUSPECTED) - Abnormal; Notable for the following components:   Color, Urine COLORLESS (*)    Specific Gravity, Urine 1.002 (*)    All other components within normal limits  LIPASE, BLOOD  CBC WITH DIFFERENTIAL/PLATELET   _____________________________________  RADIOLOGY  CT ABDOMEN PELVIS W CONTRAST Result Date: 10/09/2023 CLINICAL DATA:  Abdominal pain, acute, nonlocalized. Right flank pain for 2 weeks. History of lung carcinoma. * Tracking Code:  BO * EXAM: CT ABDOMEN AND PELVIS WITH CONTRAST TECHNIQUE: Multidetector CT imaging of the abdomen and pelvis was performed using the standard protocol following bolus administration of intravenous contrast. RADIATION DOSE REDUCTION: This exam was performed according to the departmental dose-optimization program which includes  automated exposure control, adjustment of the mA and/or kV according to patient size and/or use of iterative reconstruction technique. CONTRAST:  OMNIPAQUE  IOHEXOL  300 MG/ML  SOLN COMPARISON:  Nuclear medicine PET scan from 02/26/2023. FINDINGS: Lower chest: Redemonstration of area of fibrosis/scarring with associated bronchiectasis in the left lung lower lobe, grossly similar to the prior study. Bilateral lungs are otherwise clear. No acute consolidation, lung mass or pleural effusion. Normal cardiac size. No pericardial effusion. Note is made of asymmetric irregular thickening of the left inferior breast skin, which is nonspecific and may be due to prior treatment/radiotherapy. Correlate clinically. Hepatobiliary: The liver is normal in size. Non-cirrhotic configuration. No suspicious mass. These is mild diffuse hepatic steatosis. No intrahepatic or extrahepatic bile duct dilation. No calcified gallstones. Normal gallbladder wall thickness. No pericholecystic inflammatory changes. Pancreas: Unremarkable. No pancreatic ductal dilatation or surrounding inflammatory changes. Spleen: Within normal limits. No focal lesion. Adrenals/Urinary Tract: Adrenal glands are unremarkable. No suspicious renal mass. No hydronephrosis. No renal or ureteric calculi. Unremarkable urinary bladder. Stomach/Bowel: No disproportionate dilation of the small or large bowel loops. No evidence of abnormal bowel wall thickening or inflammatory changes. The appendix was not visualized; however there is no acute inflammatory process in the right lower quadrant. Vascular/Lymphatic: No ascites or pneumoperitoneum. No abdominal or pelvic lymphadenopathy, by size criteria. No aneurysmal dilation of the major abdominal arteries. There are mild peripheral atherosclerotic vascular calcifications of the aorta and its major branches. Reproductive: The uterus is surgically absent. No large adnexal mass. Other: Since the prior study there is  interval increase in the size of the tortuous venous collaterals in the anterior abdominal wall. There is laxity of pelvic floor with small rectocele and cystocele. The soft tissues and abdominal wall are otherwise unremarkable. Musculoskeletal: No suspicious osseous lesions. There are mild - moderate multilevel degenerative changes in the visualized spine. IMPRESSION: 1. No acute inflammatory process identified within the abdomen or pelvis. No nephroureterolithiasis or obstructive uropathy. 2. No metastatic carcinoma identified within the abdomen or pelvis. 3. Multiple other nonacute observations, as described above. Aortic Atherosclerosis (ICD10-I70.0). Electronically Signed   By: Beula Brunswick M.D.   On: 10/09/2023 14:17    ____________________________________________   PROCEDURES  Procedure(s) performed:   Procedures  None ____________________________________________   INITIAL IMPRESSION / ASSESSMENT AND PLAN / ED COURSE  Pertinent labs & imaging results that were available during my care of the patient were reviewed by me and considered in my medical decision making (see chart for details).   This patient is Presenting for Evaluation of abdominal pain, which does require a range of treatment options, and is a complaint that involves a high risk of morbidity and mortality.  The Differential Diagnoses includes but is not exclusive to acute cholecystitis, intrathoracic causes for epigastric abdominal pain, gastritis, duodenitis, pancreatitis, small bowel or large bowel obstruction, abdominal aortic aneurysm, hernia, gastritis, etc.   Critical Interventions-    Medications  sodium chloride  0.9 % bolus 500 mL (0 mLs Intravenous Stopped 10/09/23 1604)  iohexol  (OMNIPAQUE ) 300 MG/ML solution 100 mL (100 mLs Intravenous Contrast Given 10/09/23 1247)    Reassessment after intervention: pain improved.    I did obtain Additional Historical Information from daughter at bedside, as the  patient has a dementia history.  I decided to review pertinent External Data, and in summary history of non-small cell lung cancer; not currently on chemotherapy.    Clinical Laboratory Tests Ordered, included UA without infection. CBC without anemia. LFTs and lipase normal. No AKI.   Radiologic Tests Ordered, included CT abdomen/pelvis. I independently interpreted the images and agree with radiology interpretation.   Cardiac Monitor Tracing which shows NSR.    Social Determinants of Health Risk patient is a non-smoker.   Medical Decision Making: Summary:  Patient presents to the emergency department for evaluation of abdominal pain worse on the right side.  Minimally tender on exam but some grimacing and appears slightly uncomfortable.  Plan for CT abdomen pelvis labs, UA, reassess.   Reevaluation with update and discussion with patient and daughter by phone. No acute findings on labs and CT. Plan for continued symptom mgmt at home. Discussed close PCP follow up plan.   Patient's presentation is most consistent with acute presentation with potential threat to life or bodily function.   Disposition: discharge  ____________________________________________  FINAL CLINICAL IMPRESSION(S) / ED DIAGNOSES  Final diagnoses:  Generalized abdominal pain    Note:  This document was prepared using Dragon voice recognition software and may include unintentional dictation errors.  Abby Hocking, MD, [facility]    Ayonna Speranza, Shereen Dike, MD 10/10/23 [phone]

## 2023-10-09 NOTE — Discharge Instructions (Signed)

## 2023-10-09 NOTE — ED Triage Notes (Signed)
 Pt c/o intermittent R flank pain x2 weeks worsening today.  Pain score 6/10.  Denies GU/GI complaints.

## 2023-10-10 ENCOUNTER — Telehealth: Payer: Self-pay

## 2023-10-10 NOTE — Telephone Encounter (Signed)
 Copied from CRM [id number]. Topic: General - Other >> Oct 10, 2023  3:29 PM Howard Macho wrote: Reason for CRM: patient daughter called stating the patient missed a call because there was a death in the family and she want someone to reach back out to the patient

## 2023-10-10 NOTE — Transitions of Care (Post Inpatient/ED Visit) (Signed)
   10/10/2023  Name: Angelica [name] MRN: [id number] DOB: 05-04-1941  Today's TOC FU Call Status: Today's TOC FU Call Status:: Unsuccessful Call (1st Attempt) Unsuccessful Call (1st Attempt) Date: 10/10/23  Attempted to reach the patient regarding the most recent Inpatient/ED visit.  Follow Up Plan: Additional outreach attempts will be made to reach the patient to complete the Transitions of Care (Post Inpatient/ED visit) call.   Signature

## 2023-10-11 ENCOUNTER — Encounter: Payer: Self-pay | Admitting: Internal Medicine

## 2023-10-11 ENCOUNTER — Ambulatory Visit (INDEPENDENT_AMBULATORY_CARE_PROVIDER_SITE_OTHER): Admitting: Internal Medicine

## 2023-10-11 VITALS — BP 122/76 | HR 64 | Temp 97.9°F | Ht 64.0 in | Wt 130.0 lb

## 2023-10-11 DIAGNOSIS — M545 Low back pain, unspecified: Secondary | ICD-10-CM

## 2023-10-11 DIAGNOSIS — I10 Essential (primary) hypertension: Secondary | ICD-10-CM

## 2023-10-11 DIAGNOSIS — F03A0 Unspecified dementia, mild, without behavioral disturbance, psychotic disturbance, mood disturbance, and anxiety: Secondary | ICD-10-CM

## 2023-10-11 NOTE — Patient Instructions (Addendum)
 Please continue all other medications as before, and refills have been done if requested.  Please have the pharmacy call with any other refills you may need.  Please keep your appointments with your specialists as you may have planned  Please make an Appointment to return in July 29, or sooner if needed

## 2023-10-11 NOTE — Progress Notes (Unsigned)
 Patient ID: Angelica [name], female   DOB: Jul 25, 1940, 83 y.o.   MRN: [id number]        Chief Complaint: follow up with right lower back pain       HPI:  Angelica [name] is a 83 y.o. female here with daughter who explains the ED got the impression pt had abd pain 5/12 when she really had right lower back pain, which is now mild only intermittent now and non radicular.  Pt denies chest pain, increased sob or doe, wheezing, orthopnea, PND, increased LE swelling, palpitations, dizziness or syncope.   Pt denies polydipsia, polyuria, or new focal neuro s/s.  Dementia overall stable symptomatically, and not assoc with behavioral changes such as hallucinations, paranoia, or agitation.       Wt Readings from Last 3 Encounters:  10/11/23 130 lb (59 kg)  10/09/23 130 lb (59 kg)  08/27/23 130 lb 9.6 oz (59.2 kg)   BP Readings from Last 3 Encounters:  10/11/23 122/76  10/09/23 (!) 113/95  08/20/23 120/62         Past Medical History:  Diagnosis Date   Arthritis    Asthma    Bowel obstruction (HCC)    Cancer (HCC)    Environmental allergies    GERD (gastroesophageal reflux disease)    Glaucoma 05/09/2017   H. pylori infection    History of radiation therapy 07/05/20-08/05/20   Left lung IMRT Dr. Eloise Hake    HLD (hyperlipidemia) 01/16/2019   Hypertension    Iron  deficiency anemia    Osteopenia 05/26/2017   Thyroid  disease    Urticaria 07/25/2018   Past Surgical History:  Procedure Laterality Date   ABDOMINAL HYSTERECTOMY     BOWEL RESECTION N/A 05/23/2020   Procedure: SMALL BOWEL REPAIR;  Surgeon: Dorrie Gaudier, Alphonso Aschoff, MD;  Location: [facility] OR;  Service: General;  Laterality: N/A;   BREAST BIOPSY     BRONCHIAL BIOPSY  06/10/2020   Procedure: BRONCHIAL BIOPSIES;  Surgeon: Prudy Brownie, DO;  Location: [facility];  Service: Pulmonary;;   BRONCHIAL BRUSHINGS  06/10/2020   Procedure: BRONCHIAL BRUSHINGS;  Surgeon: Prudy Brownie, DO;  Location: [facility];  Service: Pulmonary;;   BRONCHIAL  NEEDLE ASPIRATION BIOPSY  06/10/2020   Procedure: BRONCHIAL NEEDLE ASPIRATION BIOPSIES;  Surgeon: Prudy Brownie, DO;  Location: [facility];  Service: Pulmonary;;   BRONCHIAL WASHINGS  06/10/2020   Procedure: BRONCHIAL WASHINGS;  Surgeon: Prudy Brownie, DO;  Location: [facility];  Service: Pulmonary;;   COLON SURGERY     DIAGNOSTIC LARYNGOSCOPY N/A 05/23/2020   Procedure: ATTEMPTED DIAGNOSTIC LAPAROSCOPY WITH ADHESIONS;  Surgeon: Dorrie Gaudier, Alphonso Aschoff, MD;  Location: [facility] OR;  Service: General;  Laterality: N/A;   IR IMAGING GUIDED PORT INSERTION  07/15/2020   IR REMOVAL TUN ACCESS W/ PORT W/O FL MOD SED  01/16/2023   KNEE SURGERY     LAPAROTOMY N/A 04/18/2015   Procedure: Exploratory laparotomy with lysis of adhesions, possible bowel resection;  Surgeon: Shela Derby, MD;  Location: [facility];  Service: General;  Laterality: N/A;   LAPAROTOMY N/A 05/23/2020   Procedure: EXPLORATORY LAPAROTOMY;  Surgeon: Derral Flick, MD;  Location: [facility];  Service: General;  Laterality: N/A;   LYSIS OF ADHESION N/A 05/23/2020   Procedure: LYSIS OF ADHESION;  Surgeon: Dorrie Gaudier Alphonso Aschoff, MD;  Location[facility] OR;  Service: General;  Laterality: N/A;   NASAL SINUS SURGERY     THORACENTESIS Left 12/03/2022   Procedure: THORACENTESIS;  Surgeon: Prudy Brownie, DO;  Location: [facility];  Service: Pulmonary;  Laterality: Left;   THYROID  SURGERY     VIDEO BRONCHOSCOPY WITH ENDOBRONCHIAL NAVIGATION Bilateral 06/10/2020   Procedure: VIDEO BRONCHOSCOPY WITH ENDOBRONCHIAL NAVIGATION;  Surgeon: Prudy Brownie, DO;  Location: [facility];  Service: Pulmonary;  Laterality: Bilateral;   VIDEO BRONCHOSCOPY WITH ENDOBRONCHIAL ULTRASOUND  06/10/2020   Procedure: VIDEO BRONCHOSCOPY WITH ENDOBRONCHIAL ULTRASOUND;  Surgeon: Prudy Brownie, DO;  Location: [facility];  Service: Pulmonary;;    reports that she quit smoking about 53 years ago. Her smoking use included cigarettes. She has been exposed to tobacco smoke.  She has never used smokeless tobacco. She reports that she does not drink alcohol and does not use drugs. family history includes Diabetes in her mother; Glaucoma in her brother and sister; Hypertension in her father and mother. Allergies  Allergen Reactions   Asa Buff (Mag [Buffered Aspirin] Other (See Comments)    Excessive sweating   Ensure [Nutritional Supplements]     Or boost - upset stomach    Fish Allergy Other (See Comments)    Unknown reaction, but allergic   Other Other (See Comments)    Gelcaps; gel-containing capsules; extended-release medication - upset stomach    Peanut-Containing Drug Products Other (See Comments)    From allergy test   Penicillins Itching   Shellfish-Derived Products Other (See Comments)    From allergy test   Strawberry Extract Nausea And Vomiting and Other (See Comments)    From allergy test    Current Outpatient Medications on File Prior to Visit  Medication Sig Dispense Refill   acetaminophen  (TYLENOL ) 500 MG tablet Take 1,000 mg by mouth every 6 (six) hours as needed for moderate pain.     albuterol  (VENTOLIN  HFA) 108 (90 Base) MCG/ACT inhaler Inhale 2 puffs into the lungs every 4 (four) hours as needed for wheezing or shortness of breath. 18 g 1   alum & mag hydroxide-simeth (MAALOX/MYLANTA) 200-200-20 MG/5ML suspension Take 15 mLs by mouth every 6 (six) hours as needed for indigestion or heartburn.     amLODipine  (NORVASC ) 10 MG tablet Take 1 tablet (10 mg total) by mouth daily. 90 tablet 3   benralizumab  (FASENRA ) 30 MG/ML prefilled syringe Inject 1 mL (30 mg total) into the skin every 8 (eight) weeks. 1 mL 6   clopidogrel  (PLAVIX ) 75 MG tablet Take 1 tablet by mouth once daily 90 tablet 3   diclofenac  Sodium (VOLTAREN ) 1 % GEL Apply 4 g topically 4 (four) times daily. (Patient taking differently: Apply 4 g topically 4 (four) times daily as needed (pain).) 100 g 0   EPINEPHrine  (EPIPEN  2-PAK) 0.3 mg/0.3 mL IJ SOAJ injection Use as directed for  severe allergic reaction 2 each 1   EQ ALLERGY RELIEF, CETIRIZINE , 10 MG tablet Take 1 tablet by mouth once daily 90 tablet 0   fluticasone  (FLONASE ) 50 MCG/ACT nasal spray USE 1 SPRAY(S) IN EACH NOSTRIL IN THE MORNING AND AT BEDTIME 16 g 0   furosemide  (LASIX ) 40 MG tablet Take 1 tablet (40 mg total) by mouth daily. (Patient taking differently: Take 40 mg by mouth daily as needed for edema.) 30 tablet 11   gentamicin  ointment (GARAMYCIN ) 0.1 % Apply 1 Application topically daily. Apply to wound daily between toes.  Use a very small amount. 30 g 0   ipratropium (ATROVENT ) 0.06 % nasal spray Place 2 sprays into both nostrils 4 (four) times daily as needed (nasal drainage). (Patient taking differently: Place 2 sprays into both nostrils daily.)  15 mL 5   levothyroxine  (SYNTHROID ) 50 MCG tablet TAKE 1 TABLET BY MOUTH ONCE DAILY BEFORE BREAKFAST 90 tablet 3   Magnesium  Hydroxide (PHILLIPS MILK OF MAGNESIA PO) Take 15-30 mLs by mouth daily as needed (for constipation).     memantine  (NAMENDA ) 10 MG tablet Take 1 tablet by mouth twice daily 60 tablet 2   montelukast  (SINGULAIR ) 10 MG tablet TAKE 1 TABLET BY MOUTH AT BEDTIME 30 tablet 5   ondansetron  (ZOFRAN -ODT) 4 MG disintegrating tablet Take 1 tablet (4 mg total) by mouth every 8 (eight) hours as needed for nausea or vomiting. 10 tablet 0   pantoprazole  (PROTONIX ) 40 MG tablet Take 1 tablet by mouth once daily 90 tablet 2   potassium chloride  SA (KLOR-CON  M) 20 MEQ tablet Take 2 tablets (40 mEq total) by mouth daily as needed. Take potasium days you take lasix . 60 tablet 5   pravastatin  (PRAVACHOL ) 80 MG tablet Take 1 tablet by mouth once daily 90 tablet 3   SYMBICORT  80-4.5 MCG/ACT inhaler INHALE 2 PUFFS BY MOUTH IN THE MORNING AND AT BEDTIME (Patient taking differently: Inhale into the lungs See admin instructions. Inhale 2 puffs once a day, may take a second dose of 2 puffs when needed for shortness of breath) 10.2 g 5   Current Facility-Administered  Medications on File Prior to Visit  Medication Dose Route Frequency Provider Last Rate Last Admin   Benralizumab  SOSY 30 mg  30 mg Subcutaneous Q8 Weeks Brian Campanile, MD   30 mg at 09/25/23 1101        ROS:  All others reviewed and negative.  Objective        PE:  BP 122/76 (BP Location: Left Arm, Patient Position: Sitting, Cuff Size: Normal)   Pulse 64   Temp 97.9 F (36.6 C) (Oral)   Ht 5\' 4"  (1.626 m)   Wt 130 lb (59 kg)   SpO2 90%   BMI 22.31 kg/m                 Constitutional: Pt appears in NAD               HENT: Head: NCAT.                Right Ear: External ear normal.                 Left Ear: External ear normal.                Eyes: . Pupils are equal, round, and reactive to light. Conjunctivae and EOM are normal               Nose: without d/c or deformity               Neck: Neck supple. Gross normal ROM               Cardiovascular: Normal rate and regular rhythm.                 Pulmonary/Chest: Effort normal and breath sounds without rales or wheezing.                Abd:  Soft, NT, ND, + BS, no organomegaly               Neurological: Pt is alert. At baseline orientation, motor grossly intact               Skin: Skin is warm. No rashes, no other  new lesions, LE edema - none               Spine nontender in the midline but does have right lumbar paraspinal spasm tender               Psychiatric: Pt behavior is normal without agitation   Micro: none  Cardiac tracings I have personally interpreted today:  none  Pertinent Radiological findings (summarize): none   Lab Results  Component Value Date   WBC 4.4 10/09/2023   HGB 12.2 10/09/2023   HCT 37.3 10/09/2023   PLT 162 10/09/2023   GLUCOSE 97 10/09/2023   CHOL 191 07/18/2021   TRIG 83.0 07/18/2021   HDL 77.50 07/18/2021   LDLCALC 97 07/18/2021   ALT 16 10/09/2023   AST 25 10/09/2023   NA 135 10/09/2023   K 3.4 (L) 10/09/2023   CL 105 10/09/2023   CREATININE 0.81 10/09/2023   BUN 12  10/09/2023   CO2 24 10/09/2023   TSH 2.400 04/10/2023   INR 1.0 11/02/2020   HGBA1C 5.8 07/18/2021   Assessment/Plan:  Angelica [name] is a 83 y.o. Black or African American [2] female with  has a past medical history of Arthritis, Asthma, Bowel obstruction (HCC), Cancer (HCC), Environmental allergies, GERD (gastroesophageal reflux disease), Glaucoma (05/09/2017), H. pylori infection, History of radiation therapy (07/05/20-08/05/20), HLD (hyperlipidemia) (01/16/2019), Hypertension, Iron  deficiency anemia, Osteopenia (05/26/2017), Thyroid  disease, and Urticaria (07/25/2018).  Low back pain Severe last wk now improved, mild only for volt gel prn and tylenol  prn for likely msk strain  HTN (hypertension) BP Readings from Last 3 Encounters:  10/11/23 122/76  10/09/23 (!) 113/95  08/20/23 120/62   Stable, pt to continue medical treatment norvasc  10 mg qd   Dementia (HCC) Overall stable without worsening behavior  Followup: Return in about 2 months (around 12/17/2023).  Rosalia Colonel, MD 10/13/2023 4:19 PM [facility]

## 2023-10-11 NOTE — Telephone Encounter (Signed)
 Patient scheduled for HFU already.

## 2023-10-13 ENCOUNTER — Encounter: Payer: Self-pay | Admitting: Internal Medicine

## 2023-10-13 DIAGNOSIS — M545 Low back pain, unspecified: Secondary | ICD-10-CM | POA: Insufficient documentation

## 2023-10-13 NOTE — Assessment & Plan Note (Signed)
 BP Readings from Last 3 Encounters:  10/11/23 122/76  10/09/23 (!) 113/95  08/20/23 120/62   Stable, pt to continue medical treatment norvasc  10 mg qd

## 2023-10-13 NOTE — Assessment & Plan Note (Signed)
 Severe last wk now improved, mild only for volt gel prn and tylenol  prn for likely msk strain

## 2023-10-13 NOTE — Assessment & Plan Note (Signed)
 Overall stable without worsening behavior

## 2023-10-15 ENCOUNTER — Telehealth: Payer: Self-pay | Admitting: Allergy

## 2023-10-15 NOTE — Telephone Encounter (Signed)
 Daughter called and stated that her mother's insurance has changed, and that Symbicort  has now become unaffordable. She would like to know if there is another brand that would be more affordable. She states if possible to have it called into the [facility] on [address] [place].

## 2023-10-16 ENCOUNTER — Other Ambulatory Visit: Payer: Self-pay | Admitting: Allergy

## 2023-10-16 ENCOUNTER — Telehealth: Payer: Self-pay

## 2023-10-16 ENCOUNTER — Other Ambulatory Visit (HOSPITAL_COMMUNITY): Payer: Self-pay

## 2023-10-16 NOTE — Telephone Encounter (Signed)
 Spoke with Daughter Taron--informed her of the information given from the prior auth team dealing with the deductible for Symbicort . Would reach out to the insurance company to inquire about deductible. Scheduled yearly OV with Dr. Tempie Fee on 01/03/2024 at 10:40a

## 2023-10-16 NOTE — Telephone Encounter (Signed)
 At this time the Brand Symbicort  is preferred and is showing as $95.00 due to the patient still having a deductible to meet. After this has been met it is showing the co-pay as $47.00.   Generic Advair Diskus is showing as $91.37 at this time due to the patients deductible, it does not give me a price for after this has been met.   *sent to office in original request

## 2023-10-16 NOTE — Telephone Encounter (Signed)
 At this time the Brand Symbicort  is preferred and is showing as $95.00 due to the patient still having a deductible to meet. After this has been met it is showing the co-pay as $47.00.   Generic Advair Diskus is showing as $91.37 at this time due to the patients deductible, it does not give me a price for after this has been met.

## 2023-10-25 ENCOUNTER — Ambulatory Visit: Admitting: Nurse Practitioner

## 2023-10-25 ENCOUNTER — Encounter: Payer: Self-pay | Admitting: Nurse Practitioner

## 2023-10-25 VITALS — BP 130/70 | HR 65 | Ht 64.0 in

## 2023-10-25 DIAGNOSIS — J90 Pleural effusion, not elsewhere classified: Secondary | ICD-10-CM

## 2023-10-25 DIAGNOSIS — Z87891 Personal history of nicotine dependence: Secondary | ICD-10-CM | POA: Diagnosis not present

## 2023-10-25 DIAGNOSIS — C3492 Malignant neoplasm of unspecified part of left bronchus or lung: Secondary | ICD-10-CM

## 2023-10-25 DIAGNOSIS — J4550 Severe persistent asthma, uncomplicated: Secondary | ICD-10-CM | POA: Diagnosis not present

## 2023-10-25 NOTE — Progress Notes (Signed)
 @Patient  ID: Angelica [name], female    DOB: 1940/10/27, 83 y.o.   MRN: [id number]  Chief Complaint  Patient presents with   Follow-up    Patient states she doing good.    Referring provider: Roslyn Coombe, MD  HPI: 83 year old female, former smoker followed for stage III adenocarcinoma of left lung and pleural effusion. She is a patient of Dr. Glenis Langdon and last seen via virtual visit 03/07/2023. She has severe asthma on biologic therapy and followed by asthma/allergy. Past medical history significant for HTN, chronic pansinusitis, nasal polyposis, LPRD, hypothyroid, dementia, HLD, IDA.   TEST/EVENTS:  11/29/2022 MR breast: no MRI evidence of malignancy. Findings of anasarca with b/l diffuse skin and soft tissue edema, massive on left as compared to right. Partially visualized pleural effusions.  01/03/2023 CT chest: right port-a-cath severely attenuated and possibly occluded. Substantial azygous collateral flow. Atherosclerosis. Multiple thyroid  nodules, largest 2.2 cm. Infiltrative edema into the mediastinum commensurate to the subcutaneous edema. Moderate b/l pleural effusions. Mild biapical pleuroparenchymal scarring. Passive atelectasis. Ground glass density nodularity has resolved. Diffuse subcutaneous edema. Thoracic spondylosis.  02/13/2023 CXR: moderate left effusion. Right lung clear.  02/26/2023 PET scan: no clear evidence of lung cancer recurrence or metastasis. Infrahilar consolidation within left lung, favored post radiation. Severeal intense hypermetabolic activity in lower abd and pelvis, associated with colon; favor benign activity. Asymmetric enlargement of left breast with significant skin thickening. Resolved effusions 06/20/2023 CT chest: atherosclerosis. Multinodular goiter. Trace b/l effusion, decreased. Marginated masslike fibrosis in infrahilar LLL, post radiation. Emphysema.   11/21/2022: Virtual visit with Dr. Thelda Finney.  Recent hospitalization in May for acute respiratory  failure secondary to edema and bilateral pleural effusions.  She also had swelling of the left arm and face.  Right-sided thoracentesis on 5/19 yielding 400 cc of fluid.  Transudative effusion.  Cytology negative for malignancy.  Received IV Lasix  and albumin  while in the hospital. Follow-up with PCP and repeat CXR demonstrated recurrent left-sided effusion so she was referred back for repeat thoracentesis.  Set up for procedure on 7/15 with Dr. Felipe Horton.  02/14/2023: Margarito Shearing with Whitten Andreoni NP for acute visit.  After her last visit, she had left thoracentesis with 450 cc of fluid removed on 7/15.  Cytology was negative for malignancy.  Had reactive mesothelial cells.  Exudative by lights criteria.  This was her second thoracentesis in total but the first on the left side.  Previous procedure was on the right. She is also been having difficulties with left breast swelling and pain.  She first noticed this after her last hospital admission in May.  She went to the ED on 5/25 and was treated for possible cellulitis with empiric doxycycline .  Given her history, her PCP did order a mammogram.  She had diffuse left breast skin and parenchymal thickening consistent with diffuse edema.  Acute onset was felt to represent mastitis; however, given her history and ultrasound of the left axilla was recommended.  There was no sonographic evidence of malignancy so she was advised to complete antibiotics and follow-up with her PCP.  Her daughter tells me that she she had failed to improve with the doxycycline  so her PCP ordered Keflex  when she was seen on 6/3.  Still had no improvement so at some point was changed back to doxycycline  for 10 days.  Her daughter tells me that this did help some and her breast swelling had reduced but symptoms never entirely resolved.  They continue to remain red and swollen.  She also continued to have swelling in the left arm as well but they opted to monitor her symptoms.  They also started her on Lasix   daily to help with the edema.  She went back to her PCP on 6/26 with worsening left breast and left upper extremity swelling.  He ordered an MR breast for further evaluation, which was completed on 11/29/2022.  There was no evidence of malignancy in either breast and no abnormal appearing lymph nodes.  She had findings of anasarca with bilateral diffuse skin and soft tissue edema, massive on the left compared to the right.  Bilateral pleural effusions were still present.  Etiology remained unclear.   She had a repeat CT scan of the chest ordered by Dr. Liam Redhead on 01/03/2023.  This revealed a severely attenuated and possibly occluded right Port-A-Cath, multiple thyroid  nodules, moderate bilateral pleural effusions, scarring and atelectasis.  Previous groundglass density had resolved.  There was no obvious sign of recurrent disease.  She did have worsening infiltrative edema into the mediastinum and subcutaneous tissue.  She was advised by her PCP to come back to pulmonary for further evaluation. Swelling of left breast and LUE unchanged. She is not having any increased shortness of breath, cough, chest congestion or pleuritic pain. She has had weight loss of almost 20 lb upon review of her chart.   03/07/2023: OV with Dr. Thelda Finney. Doing well with asthma. Still weak.  Not taking her levothyroxine ; advised her PCP. Unilateral breast swelling and some abnormal uptake in colon, noncontributory. Referred to general surgery for evaluation of the left breast for second opinion  10/25/2023: Today - follow up Discussed the use of AI scribe software for clinical note transcription with the patient, who gave verbal consent to proceed.  History of Present Illness   Angelica [name] is an 83 year old female who presents for routine follow-up with her daughter.   Her asthma is currently well-controlled. She does not need to use her rescue inhaler, albuterol , and continues to use Symbicort  and Fasenra . She has not required  any steroids or antibiotics recently, and has no cough or wheezing. No hospitalizations.   She has a history of lung cancer s/p concurrent chemoradiation and immunotherapy with Imfinzi  2022. Last CT was stable with decreased b/l effusions, and is scheduled for an updated CT scan in August.   She experienced swelling in her breast, diagnosed as lymphedema by a surgeon at [facility]. Physical therapy has resulted in a significant reduction in swelling.   No concerns or complaints today.       Allergies  Allergen Reactions   Asa Buff (Mag [Buffered Aspirin] Other (See Comments)    Excessive sweating   Ensure [Nutritional Supplements]     Or boost - upset stomach    Fish Allergy Other (See Comments)    Unknown reaction, but allergic   Other Other (See Comments)    Gelcaps; gel-containing capsules; extended-release medication - upset stomach    Peanut-Containing Drug Products Other (See Comments)    From allergy test   Penicillins Itching   Shellfish-Derived Products Other (See Comments)    From allergy test   Strawberry Extract Nausea And Vomiting and Other (See Comments)    From allergy test     Immunization History  Administered Date(s) Administered   Fluad Quad(high Dose 65+) 01/16/2019, 03/05/2020, 02/01/2021, 02/26/2022   Fluad Trivalent(High Dose 65+) 02/13/2023   Influenza, High Dose Seasonal PF 01/30/2018   PFIZER Comirnaty(Gray Top)Covid-19 Tri-Sucrose Vaccine 02/23/2022  PFIZER(Purple Top)SARS-COV-2 Vaccination 07/13/2019, 08/03/2019, 02/16/2020   Pneumococcal Conjugate-13 11/08/2017    Past Medical History:  Diagnosis Date   Arthritis    Asthma    Bowel obstruction (HCC)    Cancer (HCC)    Environmental allergies    GERD (gastroesophageal reflux disease)    Glaucoma 05/09/2017   H. pylori infection    History of radiation therapy 07/05/20-08/05/20   Left lung IMRT Dr. Eloise Hake    HLD (hyperlipidemia) 01/16/2019   Hypertension    Iron  deficiency  anemia    Osteopenia 05/26/2017   Thyroid  disease    Urticaria 07/25/2018    Tobacco History: Social History   Tobacco Use  Smoking Status Former   Current packs/day: 0.00   Types: Cigarettes   Quit date: 05/21/1970   Years since quitting: 53.4   Passive exposure: Past  Smokeless Tobacco Never  Tobacco Comments   quit 60 years ago   Counseling given: Not Answered Tobacco comments: quit 60 years ago   Outpatient Medications Prior to Visit  Medication Sig Dispense Refill   acetaminophen  (TYLENOL ) 500 MG tablet Take 1,000 mg by mouth every 6 (six) hours as needed for moderate pain.     albuterol  (VENTOLIN  HFA) 108 (90 Base) MCG/ACT inhaler Inhale 2 puffs into the lungs every 4 (four) hours as needed for wheezing or shortness of breath. 18 g 1   alum & mag hydroxide-simeth (MAALOX/MYLANTA) 200-200-20 MG/5ML suspension Take 15 mLs by mouth every 6 (six) hours as needed for indigestion or heartburn.     amLODipine  (NORVASC ) 10 MG tablet Take 1 tablet (10 mg total) by mouth daily. 90 tablet 3   benralizumab  (FASENRA ) 30 MG/ML prefilled syringe Inject 1 mL (30 mg total) into the skin every 8 (eight) weeks. 1 mL 6   clopidogrel  (PLAVIX ) 75 MG tablet Take 1 tablet by mouth once daily 90 tablet 3   diclofenac  Sodium (VOLTAREN ) 1 % GEL Apply 4 g topically 4 (four) times daily. (Patient taking differently: Apply 4 g topically 4 (four) times daily as needed (pain).) 100 g 0   EPINEPHrine  (EPIPEN  2-PAK) 0.3 mg/0.3 mL IJ SOAJ injection Use as directed for severe allergic reaction 2 each 1   EQ ALLERGY RELIEF, CETIRIZINE , 10 MG tablet Take 1 tablet by mouth once daily 90 tablet 0   fluticasone  (FLONASE ) 50 MCG/ACT nasal spray USE 1 SPRAY(S) IN EACH NOSTRIL IN THE MORNING AND AT BEDTIME 16 g 0   furosemide  (LASIX ) 40 MG tablet Take 1 tablet (40 mg total) by mouth daily. (Patient taking differently: Take 40 mg by mouth daily as needed for edema.) 30 tablet 11   gentamicin  ointment (GARAMYCIN ) 0.1 % Apply  1 Application topically daily. Apply to wound daily between toes.  Use a very small amount. 30 g 0   ipratropium (ATROVENT ) 0.06 % nasal spray Place 2 sprays into both nostrils 4 (four) times daily as needed (nasal drainage). (Patient taking differently: Place 2 sprays into both nostrils daily.) 15 mL 5   levothyroxine  (SYNTHROID ) 50 MCG tablet TAKE 1 TABLET BY MOUTH ONCE DAILY BEFORE BREAKFAST 90 tablet 3   Magnesium  Hydroxide (PHILLIPS MILK OF MAGNESIA PO) Take 15-30 mLs by mouth daily as needed (for constipation).     memantine  (NAMENDA ) 10 MG tablet Take 1 tablet by mouth twice daily 60 tablet 2   montelukast  (SINGULAIR ) 10 MG tablet TAKE 1 TABLET BY MOUTH AT BEDTIME 30 tablet 5   ondansetron  (ZOFRAN -ODT) 4 MG disintegrating tablet Take 1 tablet (4  mg total) by mouth every 8 (eight) hours as needed for nausea or vomiting. 10 tablet 0   pantoprazole  (PROTONIX ) 40 MG tablet Take 1 tablet by mouth once daily 90 tablet 2   potassium chloride  SA (KLOR-CON  M) 20 MEQ tablet Take 2 tablets (40 mEq total) by mouth daily as needed. Take potasium days you take lasix . 60 tablet 5   pravastatin  (PRAVACHOL ) 80 MG tablet Take 1 tablet by mouth once daily 90 tablet 3   SYMBICORT  80-4.5 MCG/ACT inhaler INHALE 2 PUFFS BY MOUTH IN THE MORNING AND AT BEDTIME 11 g 0   Facility-Administered Medications Prior to Visit  Medication Dose Route Frequency Provider Last Rate Last Admin   Benralizumab  SOSY 30 mg  30 mg Subcutaneous Q8 Weeks Brian Campanile, MD   30 mg at 09/25/23 1101     Review of Systems:   Constitutional: No weight change, night sweats, fevers, chills, or lassitude. +fatigue (baseline) HEENT: No headaches, difficulty swallowing, tooth/dental problems, or sore throat. No sneezing, itching, ear ache, nasal congestion, or post nasal drip CV:  +LUE and breast edema (significant improvement). No chest pain, orthopnea, PND, swelling in lower extremities, dizziness, palpitations, syncope Resp:  +baseline shortness of breath with exertion. No excess mucus or change in color of mucus. No productive or non-productive. No hemoptysis. No wheezing.  No chest wall deformity GI:  No heartburn, indigestion GU: No dysuria, change in color of urine, urgency or frequency.   Skin: No rash, lesions, ulcerations.  MSK:  No joint pain or swelling.   Neuro: No dizziness or lightheadedness.  Psych: No depression or anxiety. Mood stable.     Physical Exam:  BP 130/70 (BP Location: Right Arm, Patient Position: Sitting, Cuff Size: Normal)   Pulse 65   Ht 5\' 4"  (1.626 m)   SpO2 100%   BMI 22.31 kg/m   GEN: Pleasant, interactive, well-kempt; elderly; in no acute distress. HEENT:  Normocephalic and atraumatic. PERRLA. Sclera white. Nasal turbinates pink, moist and patent bilaterally. No rhinorrhea present. Oropharynx pink and moist, without exudate or edema. No lesions, ulcerations, or postnasal drip.  NECK:  Supple w/ fair ROM. No JVD present. Normal carotid impulses w/o bruits. Thyroid  symmetrical with no goiter or nodules palpated. No lymphadenopathy.   CV: RRR, no m/r/g. No lower extremity swelling. Pulses intact, +2 bilaterally. No cyanosis, pallor or clubbing.   PULMONARY:  Unlabored, regular breathing. Clear bilaterally A&P w/o wheezes/rales/rhonchi. No accessory muscle use.  GI: BS present and normoactive. Soft, non-tender to palpation. No organomegaly or masses detected.  MSK: Cap refil <2 sec all extrem.  Neuro: A/Ox3. No focal deficits noted.   Skin: Warm, no lesions or rashes. No exudate or erythema  Psych: Normal affect and behavior. Judgement and thought content appropriate.     Lab Results:  CBC    Component Value Date/Time   WBC 4.4 10/09/2023 1042   RBC 4.05 10/09/2023 1042   HGB 12.2 10/09/2023 1042   HGB 12.8 07/03/2023 1526   HGB 13.1 04/09/2017 1721   HCT 37.3 10/09/2023 1042   HCT 39.8 04/09/2017 1721   PLT 162 10/09/2023 1042   PLT 183 07/03/2023 1526   PLT 355  04/09/2017 1721   MCV 92.1 10/09/2023 1042   MCV 91 04/09/2017 1721   MCH 30.1 10/09/2023 1042   MCHC 32.7 10/09/2023 1042   RDW 13.9 10/09/2023 1042   RDW 14.0 04/09/2017 1721   LYMPHSABS 1.3 10/09/2023 1042   LYMPHSABS 3.6 (H) 04/09/2017 1721  MONOABS 0.4 10/09/2023 1042   EOSABS 0.0 10/09/2023 1042   EOSABS 0.7 (H) 04/09/2017 1721   BASOSABS 0.0 10/09/2023 1042   BASOSABS 0.0 04/09/2017 1721    BMET    Component Value Date/Time   NA 135 10/09/2023 1042   K 3.4 (L) 10/09/2023 1042   CL 105 10/09/2023 1042   CO2 24 10/09/2023 1042   GLUCOSE 97 10/09/2023 1042   BUN 12 10/09/2023 1042   CREATININE 0.81 10/09/2023 1042   CREATININE 0.84 07/03/2023 1526   CREATININE 0.86 11/02/2022 1604   CALCIUM 8.9 10/09/2023 1042   GFRNONAA >60 10/09/2023 1042   GFRNONAA >60 07/03/2023 1526   GFRAA >60 08/15/2017 0012    BNP    Component Value Date/Time   BNP 29.0 10/05/2022 1040     Imaging:  CT ABDOMEN PELVIS W CONTRAST Result Date: 10/09/2023 CLINICAL DATA:  Abdominal pain, acute, nonlocalized. Right flank pain for 2 weeks. History of lung carcinoma. * Tracking Code: BO * EXAM: CT ABDOMEN AND PELVIS WITH CONTRAST TECHNIQUE: Multidetector CT imaging of the abdomen and pelvis was performed using the standard protocol following bolus administration of intravenous contrast. RADIATION DOSE REDUCTION: This exam was performed according to the departmental dose-optimization program which includes automated exposure control, adjustment of the mA and/or kV according to patient size and/or use of iterative reconstruction technique. CONTRAST:  OMNIPAQUE  IOHEXOL  300 MG/ML  SOLN COMPARISON:  Nuclear medicine PET scan from 02/26/2023. FINDINGS: Lower chest: Redemonstration of area of fibrosis/scarring with associated bronchiectasis in the left lung lower lobe, grossly similar to the prior study. Bilateral lungs are otherwise clear. No acute consolidation, lung mass or pleural effusion. Normal  cardiac size. No pericardial effusion. Note is made of asymmetric irregular thickening of the left inferior breast skin, which is nonspecific and may be due to prior treatment/radiotherapy. Correlate clinically. Hepatobiliary: The liver is normal in size. Non-cirrhotic configuration. No suspicious mass. These is mild diffuse hepatic steatosis. No intrahepatic or extrahepatic bile duct dilation. No calcified gallstones. Normal gallbladder wall thickness. No pericholecystic inflammatory changes. Pancreas: Unremarkable. No pancreatic ductal dilatation or surrounding inflammatory changes. Spleen: Within normal limits. No focal lesion. Adrenals/Urinary Tract: Adrenal glands are unremarkable. No suspicious renal mass. No hydronephrosis. No renal or ureteric calculi. Unremarkable urinary bladder. Stomach/Bowel: No disproportionate dilation of the small or large bowel loops. No evidence of abnormal bowel wall thickening or inflammatory changes. The appendix was not visualized; however there is no acute inflammatory process in the right lower quadrant. Vascular/Lymphatic: No ascites or pneumoperitoneum. No abdominal or pelvic lymphadenopathy, by size criteria. No aneurysmal dilation of the major abdominal arteries. There are mild peripheral atherosclerotic vascular calcifications of the aorta and its major branches. Reproductive: The uterus is surgically absent. No large adnexal mass. Other: Since the prior study there is interval increase in the size of the tortuous venous collaterals in the anterior abdominal wall. There is laxity of pelvic floor with small rectocele and cystocele. The soft tissues and abdominal wall are otherwise unremarkable. Musculoskeletal: No suspicious osseous lesions. There are mild - moderate multilevel degenerative changes in the visualized spine. IMPRESSION: 1. No acute inflammatory process identified within the abdomen or pelvis. No nephroureterolithiasis or obstructive uropathy. 2. No  metastatic carcinoma identified within the abdomen or pelvis. 3. Multiple other nonacute observations, as described above. Aortic Atherosclerosis (ICD10-I70.0). Electronically Signed   By: Beula Brunswick M.D.   On: 10/09/2023 14:17    Benralizumab  SOSY 30 mg     Date Action Dose Route  User   Discharged on 10/09/2023   Admitted on 10/09/2023   09/25/2023 1101 Given 30 mg Subcutaneous (Left Arm) Carry Clapper L, CMA           No data to display          No results found for: "NITRICOXIDE"      Assessment & Plan:   Asthma, severe persistent, well-controlled Well controlled severe asthma on biologic therapy with Fasenra . No exacerbations or hospitalizations. Continue current regimen. Trigger prevention reviewed. Action plan in place.  Patient Instructions  Continue Albuterol  inhaler 2 puffs every 6 hours as needed for shortness of breath or wheezing. Notify if symptoms persist despite rescue inhaler/neb use.  Continue flonase  nasal spray Continue singulair  1 tab daily Continue symbicort  2 puffs Twice daily. Brush tongue and rinse mouth afterwards  Follow up with oncology as scheduled    Follow up in 6 months with new MD or Katie Foster Sonnier,NP. If symptoms do not improve or worsen, please contact office for sooner follow up or seek emergency care.    Adenocarcinoma of left lung, stage 3 (HCC) S/p concurrent chemoradiation and consolidation with Imfinzi  2022. Stable CT imaging. Repeat planned for August 2025. Follow up with Dr. Liam Redhead as scheduled.   Pleural effusion Decreased on most recent CT imaging. Continue to monitor    I spent 25 minutes of dedicated to the care of this patient on the date of this encounter to include pre-visit review of records, face-to-face time with the patient discussing conditions above, post visit ordering of testing, clinical documentation with the electronic health record, making appropriate referrals as documented, and communicating necessary  findings to members of the patients care team.  Roetta Clarke, NP 10/25/2023  Pt aware and understands NP's role.

## 2023-10-25 NOTE — Patient Instructions (Addendum)
 Continue Albuterol  inhaler 2 puffs every 6 hours as needed for shortness of breath or wheezing. Notify if symptoms persist despite rescue inhaler/neb use.  Continue flonase  nasal spray Continue singulair  1 tab daily Continue symbicort  2 puffs Twice daily. Brush tongue and rinse mouth afterwards  Follow up with oncology as scheduled    Follow up in 6 months with new MD or Angelica Sahas Sluka,NP. If symptoms do not improve or worsen, please contact office for sooner follow up or seek emergency care.

## 2023-10-25 NOTE — Assessment & Plan Note (Signed)
 Well controlled severe asthma on biologic therapy with Fasenra . No exacerbations or hospitalizations. Continue current regimen. Trigger prevention reviewed. Action plan in place.  Patient Instructions  Continue Albuterol  inhaler 2 puffs every 6 hours as needed for shortness of breath or wheezing. Notify if symptoms persist despite rescue inhaler/neb use.  Continue flonase  nasal spray Continue singulair  1 tab daily Continue symbicort  2 puffs Twice daily. Brush tongue and rinse mouth afterwards  Follow up with oncology as scheduled    Follow up in 6 months with new MD or Katie Adewale Pucillo,NP. If symptoms do not improve or worsen, please contact office for sooner follow up or seek emergency care.

## 2023-10-25 NOTE — Assessment & Plan Note (Signed)
 Decreased on most recent CT imaging. Continue to monitor

## 2023-10-25 NOTE — Assessment & Plan Note (Signed)
 S/p concurrent chemoradiation and consolidation with Imfinzi  2022. Stable CT imaging. Repeat planned for August 2025. Follow up with Dr. Liam Redhead as scheduled.

## 2023-10-30 ENCOUNTER — Encounter: Payer: Self-pay | Admitting: Adult Health

## 2023-10-30 ENCOUNTER — Ambulatory Visit: Payer: Medicare HMO | Admitting: Adult Health

## 2023-10-30 VITALS — BP 154/85 | HR 58 | Ht 65.0 in | Wt 135.0 lb

## 2023-10-30 DIAGNOSIS — G309 Alzheimer's disease, unspecified: Secondary | ICD-10-CM

## 2023-10-30 DIAGNOSIS — F0280 Dementia in other diseases classified elsewhere without behavioral disturbance: Secondary | ICD-10-CM | POA: Diagnosis not present

## 2023-10-30 MED ORDER — MEMANTINE HCL 10 MG PO TABS: 10.0000 mg | ORAL_TABLET | Freq: Two times a day (BID) | ORAL | 3 refills | Status: DC

## 2023-10-30 NOTE — Patient Instructions (Addendum)
 Your Plan:  Continue Namenda  10mg  twice daily      Follow up in 9 months or call earlier if needed     Thank you for coming to see us  at [facility]. I hope we have been able to provide you high quality care today.  You may receive a patient satisfaction survey over the next few weeks. We would appreciate your feedback and comments so that we may continue to improve ourselves and the health of our patients.

## 2023-10-30 NOTE — Progress Notes (Signed)
 [facility] [address] [place]. [place] [address] [phone]       OFFICE FOLLOW-UP VISIT NOTE  Ms. Jeanne Milliner Date of Birth:  04/05/1941 Medical Record Number:  [id number]    Primary neurologist: Dr. Janett Medin Reason for visit: memory loss   Chief Complaint  Patient presents with   Memory Loss    Rm 8 with daughter Hermon Lor Pt is well and stable, reports no new concerns since last visit. Daughter noted she has not seen much improvement but no decline. Behavior and agitation is also stable.       HPI:   Update 10/30/2023 JM: Patient returns for follow-up visit accompanied by her daughter, Hermon Lor. Denies any noticeable decline in memory since prior visit.  Remains on Namenda  10 mg twice daily.  Reports she sleeps well and has a good appetite.  No significant behavioral concerns.  Continues to live with her daughter, able to maintain ADLs independently.  She continues to try to stay active, ambulates without AD and no recent falls.  She occasionally does puzzle books.  Routinely follows with PCP Dr. Autry Legions.  No questions or concerns at this time.      History provided for reference purposes only Update 04/10/2023 JM: Returns for follow-up visit accompanied by her daughter.  Daughter notes some decline in memory as well as some increased agitation, at times will refuse to take medications and can get quickly agitated.  When daughter is speaking of this, she quickly becomes agitated and says she always takes her medications.  Reports maintaining ADLs independently.  Continues to live with her other daughter who is not present today.  She occasionally does cognitive exercises such as puzzle books. Reports she tries to stay active by walking around her home. Ambulates without AD, denies any recent falls. Reports she sleeps well at night. Report appetite is good.  No other behavioral concerns.  Remains on Namenda  without side effects.  Denies new stroke/TIA symptoms.  Blood  pressure elevated today, does not routinely monitor at home, asymptomatic.  Routinely follows with PCP Dr. Autry Legions.  No further questions or concerns at this time.  Update 10/01/2022 Dr. Janett Medin: She returns for follow-up after last visit 6 months ago.  She is accompanied by her daughter.  Patient is still living with the daughter.  Continues to have short-term memory difficulties and poor recall of recent events.  She this is still mostly independent in activities like bathing, cleaning, dressing herself and feeding herself.  The infection is improved breakfast.  She has not been driving.  She has not exhibited any unsafe behavior, agitation, delusions or hallucinations.  She does not wander away or get lost.  She has tolerated increasing the dose of Namenda  to 10 mg twice daily at last visit but daughter has not noticed any improvement with the increased dose but she has not noticed any worsening either.  On mental status exam testing today she scored 14/30 which is not significantly different from.  15/30 at last visit  Update 04/17/2022 Dr. Janett Medin: : She returns for follow-up after last visit 8 months ago.  She is accompanied by her daughter.  They feel her memory difficulties are more or less unchanged however on objective testing she has shown some decline on cognitive testing in the office today and Mini-Mental exam score was 15/30 compared to 18/30 at last visit.  She denies any delusions, hallucinations, agitation or unsafe behavior..  She is bothered by left hip pain and has started physical  therapy for that.  Her cancer is stable and she has an upcoming visit to see Dr. Marguerita Shih next month.  He is on Namenda  5 mg twice daily which is tolerating well without side effects but for unclear reasons she has never tried a higher dose.  She had EEG done on 08/08/2021 which was normal.  CT angiogram on 08/10/2021 showed mild tortuosity of cervical carotid with no large vessel stenosis or occlusion.  She has no new  complaints today.  Initial visit 3/25 2023 Dr. Janett Medin: Ms. Lykins is a pleasant 83 year old African-American lady with initial office consultation visit.  She is accompanied by 2 of her daughters.  History is obtained from them and review of electronic medical records and I have personally reviewed pertinent available imaging films in PACS.  She is a 83 year old African-American lady with past medical history of known lung cancer s/p chemotherapy, radiation.  Acid reflux, hypertension and memory loss.  Patient has had for the last 6 to 9 months progressive decline in memory and cognition.  Her short-term memory is quite poor though long-term he is fine.  He needs constant reminders and repetition for anything-year-old.  She does not do well with change.  She lives at home with her daughter and is mostly independent in all activities of daily living.  Her daughter is not in charge of her primary and arranging her medicines.  She has not exhibited any unsafe behaviors except a while back she left the faucet on and with the whole bathroom.  She denies any, delusions, hallucinations, agitation.  She does not wander off.  She has no prior history of significant head injury, loss of consciousness, seizures, stroke or TIA.  She and her daughter specifically denies any history of slurred speech, aphasia, vertigo, diplopia, focal extremity weakness, gait imbalance.  She had an MRI scan of the brain done on 07/18/2021 which I personally reviewed and shows only mild changes of small vessel disease and generalized atrophy.  I do not see a definite right cerebellar stroke but the radiologist has called 1 which apparently was not seen on previous MRI from 06/14/2020.  Patient has since been started on Plavix  by Dr. Autry Legions and is tolerating it well without bruising or bleeding.  Blood pressure is usually better controlled today it is elevated at 156/82.  Lab work on 07/18/2021 showed LDL cholesterol 97 mg percent, hemoglobin A1c 5.8.   Vitamin B12, vitamin D  and free T4 levels are all normal.  She has not yet had an EEG.  She was started on Namenda  starter pack presently taking 5 mg once a day.  Daughters are concerned about her gastric reflux and did not want to try Aricept.  On Mini-Mental status exam today she scored 18/30 and clock drawing 3/4.  On 07/20/2021 she scored 11/30 on MoCA testing with our urology office Family history of Alzheimer's in her father.       ROS:   14 system review of systems is positive for memory loss, confusion and all other systems negative  PMH:  Past Medical History:  Diagnosis Date   Arthritis    Asthma    Bowel obstruction (HCC)    Cancer (HCC)    Environmental allergies    GERD (gastroesophageal reflux disease)    Glaucoma 05/09/2017   H. pylori infection    History of radiation therapy 07/05/20-08/05/20   Left lung IMRT Dr. Eloise Hake    HLD (hyperlipidemia) 01/16/2019   Hypertension    Iron  deficiency anemia  Osteopenia 05/26/2017   Thyroid  disease    Urticaria 07/25/2018    Social History:  Social History   Socioeconomic History   Marital status: Widowed    Spouse name: Not on file   Number of children: 4   Years of education: 75   Highest education level: 8th grade  Occupational History   Occupation: retired  Tobacco Use   Smoking status: Former    Current packs/day: 0.00    Types: Cigarettes    Quit date: 05/21/1970    Years since quitting: 53.4    Passive exposure: Past   Smokeless tobacco: Never   Tobacco comments:    quit 60 years ago  Vaping Use   Vaping status: Never Used  Substance and Sexual Activity   Alcohol use: No   Drug use: No   Sexual activity: Never  Other Topics Concern   Not on file  Social History Narrative   Right handed   Caffeine on occ   One story home   Social Drivers of Health   Financial Resource Strain: Low Risk  (08/20/2023)   Overall Financial Resource Strain (CARDIA)    Difficulty of Paying Living Expenses: Not hard at  all  Food Insecurity: No Food Insecurity (08/20/2023)   Hunger Vital Sign    Worried About Running Out of Food in the Last Year: Never true    Ran Out of Food in the Last Year: Never true  Transportation Needs: No Transportation Needs (08/20/2023)   PRAPARE - Administrator, Civil Service (Medical): No    Lack of Transportation (Non-Medical): No  Physical Activity: Insufficiently Active (08/20/2023)   Exercise Vital Sign    Days of Exercise per Week: 5 days    Minutes of Exercise per Session: 20 min  Stress: No Stress Concern Present (08/20/2023)   [employer] of Occupational Health - Occupational Stress Questionnaire    Feeling of Stress : Not at all  Social Connections: Moderately Integrated (08/20/2023)   Social Connection and Isolation Panel [NHANES]    Frequency of Communication with Friends and Family: More than three times a week    Frequency of Social Gatherings with Friends and Family: More than three times a week    Attends Religious Services: More than 4 times per year    Active Member of [employer] or Organizations: Yes    Attends Banker Meetings: More than 4 times per year    Marital Status: Widowed  Intimate Partner Violence: Not At Risk (08/20/2023)   Humiliation, Afraid, Rape, and Kick questionnaire    Fear of Current or Ex-Partner: No    Emotionally Abused: No    Physically Abused: No    Sexually Abused: No    Medications:   Current Outpatient Medications on File Prior to Visit  Medication Sig Dispense Refill   acetaminophen  (TYLENOL ) 500 MG tablet Take 1,000 mg by mouth every 6 (six) hours as needed for moderate pain.     albuterol  (VENTOLIN  HFA) 108 (90 Base) MCG/ACT inhaler Inhale 2 puffs into the lungs every 4 (four) hours as needed for wheezing or shortness of breath. 18 g 1   alum & mag hydroxide-simeth (MAALOX/MYLANTA) 200-200-20 MG/5ML suspension Take 15 mLs by mouth every 6 (six) hours as needed for indigestion or heartburn.     amLODipine   (NORVASC ) 10 MG tablet Take 1 tablet (10 mg total) by mouth daily. 90 tablet 3   benralizumab  (FASENRA ) 30 MG/ML prefilled syringe Inject 1 mL (30 mg total)  into the skin every 8 (eight) weeks. 1 mL 6   clopidogrel  (PLAVIX ) 75 MG tablet Take 1 tablet by mouth once daily 90 tablet 3   diclofenac  Sodium (VOLTAREN ) 1 % GEL Apply 4 g topically 4 (four) times daily. (Patient taking differently: Apply 4 g topically 4 (four) times daily as needed (pain).) 100 g 0   EPINEPHrine  (EPIPEN  2-PAK) 0.3 mg/0.3 mL IJ SOAJ injection Use as directed for severe allergic reaction 2 each 1   EQ ALLERGY RELIEF, CETIRIZINE , 10 MG tablet Take 1 tablet by mouth once daily 90 tablet 0   fluticasone  (FLONASE ) 50 MCG/ACT nasal spray USE 1 SPRAY(S) IN EACH NOSTRIL IN THE MORNING AND AT BEDTIME 16 g 0   gentamicin  ointment (GARAMYCIN ) 0.1 % Apply 1 Application topically daily. Apply to wound daily between toes.  Use a very small amount. 30 g 0   ipratropium (ATROVENT ) 0.06 % nasal spray Place 2 sprays into both nostrils 4 (four) times daily as needed (nasal drainage). (Patient taking differently: Place 2 sprays into both nostrils daily.) 15 mL 5   levothyroxine  (SYNTHROID ) 50 MCG tablet TAKE 1 TABLET BY MOUTH ONCE DAILY BEFORE BREAKFAST 90 tablet 3   Magnesium  Hydroxide (PHILLIPS MILK OF MAGNESIA PO) Take 15-30 mLs by mouth daily as needed (for constipation).     memantine  (NAMENDA ) 10 MG tablet Take 1 tablet by mouth twice daily 60 tablet 2   montelukast  (SINGULAIR ) 10 MG tablet TAKE 1 TABLET BY MOUTH AT BEDTIME 30 tablet 5   ondansetron  (ZOFRAN -ODT) 4 MG disintegrating tablet Take 1 tablet (4 mg total) by mouth every 8 (eight) hours as needed for nausea or vomiting. 10 tablet 0   pantoprazole  (PROTONIX ) 40 MG tablet Take 1 tablet by mouth once daily 90 tablet 2   potassium chloride  SA (KLOR-CON  M) 20 MEQ tablet Take 2 tablets (40 mEq total) by mouth daily as needed. Take potasium days you take lasix . 60 tablet 5   pravastatin   (PRAVACHOL ) 80 MG tablet Take 1 tablet by mouth once daily 90 tablet 3   SYMBICORT  80-4.5 MCG/ACT inhaler INHALE 2 PUFFS BY MOUTH IN THE MORNING AND AT BEDTIME 11 g 0   Current Facility-Administered Medications on File Prior to Visit  Medication Dose Route Frequency Provider Last Rate Last Admin   Benralizumab  SOSY 30 mg  30 mg Subcutaneous Q8 Weeks Padgett, Shaylar Patricia, MD   30 mg at 09/25/23 1101    Allergies:   Allergies  Allergen Reactions   Asa Buff (Mag [Buffered Aspirin] Other (See Comments)    Excessive sweating   Ensure [Nutritional Supplements]     Or boost - upset stomach    Fish Allergy Other (See Comments)    Unknown reaction, but allergic   Other Other (See Comments)    Gelcaps; gel-containing capsules; extended-release medication - upset stomach    Peanut-Containing Drug Products Other (See Comments)    From allergy test   Penicillins Itching   Shellfish-Derived Products Other (See Comments)    From allergy test   Strawberry Extract Nausea And Vomiting and Other (See Comments)    From allergy test     Physical Exam Today's Vitals   10/30/23 1102  BP: (!) 154/85  Pulse: (!) 58  Weight: 135 lb (61.2 kg)  Height: 5' 5 (1.651 m)    Body mass index is 22.47 kg/m.  General: Frail very pleasant elderly African-American lady seated, in no evident distress  Neurologic Exam Mental Status: Awake and fully alert. Oriented  to place and time. Recent and remote memory  poor. Attention span, concentration and fund of knowledge diminished. Mood and affect appropriate although could quickly became agitated Cranial Nerves: Pupils equal, briskly reactive to light. Extraocular movements full without nystagmus. Visual fields full to confrontation. Hearing intact. Facial sensation intact. Face, tongue, palate moves normally and symmetrically.  Motor: Normal bulk and tone. Normal strength in all tested extremity muscles. Sensory.: intact to touch , pinprick , position and  vibratory sensation.  Coordination: Rapid alternating movements normal in all extremities. Finger-to-nose and heel-to-shin performed accurately bilaterally. Reflexes: 1+ and symmetric. Toes downgoing.       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  Abnormal report of MRI of the brain raising concerns of cerebellar stroke but not convincing by Dr. Wynelle Heather assessment and patient has no clinical history of TIA or stroke.  Vascular risk factors of age, mild hyperlipidemia and recent lung cancer.      Subjectively, cognition stable since prior visit despite decline in MMSE.  Recommend continuation of memantine  10 mg twice daily.  Would not recommend use of Aricept due to bradycardia.  No significant behavioral concerns. B12 [id number] 03/2023, previously 155 (09/2022), advise ongoing monitoring by PCP.  Discussed importance of routine physical and cognitive exercises as well as ensuring good sleep and healthy diet.    Follow-up in 9 months or call earlier if needed    I personally spent a total of 25 minutes in the care of the patient today including preparing to see the patient, performing a medically appropriate exam/evaluation, counseling and educating, placing orders, and documenting clinical information in the EHR.   Johny Nap, AGNP-BC  [facility] [address] [place], [place] [address]  Phone [phone] Fax [phone] Note: This document was prepared with digital dictation and possible smart phrase technology. Any transcriptional errors that result from this process are unintentional.

## 2023-11-10 ENCOUNTER — Other Ambulatory Visit: Payer: Self-pay | Admitting: Allergy

## 2023-11-12 DIAGNOSIS — H04123 Dry eye syndrome of bilateral lacrimal glands: Secondary | ICD-10-CM | POA: Diagnosis not present

## 2023-11-12 DIAGNOSIS — H40023 Open angle with borderline findings, high risk, bilateral: Secondary | ICD-10-CM | POA: Diagnosis not present

## 2023-11-13 ENCOUNTER — Encounter: Payer: Self-pay | Admitting: Internal Medicine

## 2023-11-16 ENCOUNTER — Other Ambulatory Visit: Payer: Self-pay | Admitting: Allergy

## 2023-11-20 ENCOUNTER — Ambulatory Visit

## 2023-11-20 ENCOUNTER — Telehealth: Payer: Self-pay | Admitting: Allergy

## 2023-11-20 DIAGNOSIS — J4550 Severe persistent asthma, uncomplicated: Secondary | ICD-10-CM

## 2023-11-20 MED ORDER — MONTELUKAST SODIUM 10 MG PO TABS: 10.0000 mg | ORAL_TABLET | Freq: Every day | ORAL | 0 refills | Status: DC

## 2023-11-20 NOTE — Telephone Encounter (Signed)
 Patients daughter stated that montelukast  was not approved by Dr Jeneal. Patient needs refill. [facility]acy is [facility] on [address] Her call back number is [phone]

## 2023-11-20 NOTE — Telephone Encounter (Signed)
 Patient made an OV to see Dr. Jeneal in August. Patient must keep appointment for further refills. 30 day supply of singular has been sent into the patient's pharmacy.    - Pharmacy is [facility] on [address]. Patient has been notified Her call back number is [phone]

## 2023-11-23 ENCOUNTER — Other Ambulatory Visit: Payer: Self-pay | Admitting: Allergy

## 2023-11-26 ENCOUNTER — Other Ambulatory Visit: Payer: Self-pay | Admitting: Neurology

## 2023-12-09 ENCOUNTER — Ambulatory Visit (INDEPENDENT_AMBULATORY_CARE_PROVIDER_SITE_OTHER): Admitting: Podiatry

## 2023-12-09 DIAGNOSIS — L84 Corns and callosities: Secondary | ICD-10-CM

## 2023-12-09 DIAGNOSIS — M79674 Pain in right toe(s): Secondary | ICD-10-CM | POA: Diagnosis not present

## 2023-12-09 DIAGNOSIS — B351 Tinea unguium: Secondary | ICD-10-CM

## 2023-12-09 DIAGNOSIS — M79675 Pain in left toe(s): Secondary | ICD-10-CM

## 2023-12-09 DIAGNOSIS — I739 Peripheral vascular disease, unspecified: Secondary | ICD-10-CM | POA: Diagnosis not present

## 2023-12-09 NOTE — Progress Notes (Signed)
 Subjective:  Patient ID: Angelica [name], female    DOB: 1941/03/16,  MRN: [id number]  Angelica [name] presents to clinic today for:  Chief Complaint  Patient presents with   Nail Problem    Pt stated that she is here to have her nails trimmed she stated that she has a corn on her left 5th toe    Patient notes nails are thick and elongated, causing pain in shoe gear when ambulating.  She has a painful corn on the left fifth toe.  Her daughter is with her today.  Her daughter may schedule an appointment for herself in the near future  PCP is Norleen Lynwood ORN, MD. last seen on 10/11/2023  Past Medical History:  Diagnosis Date   Arthritis    Asthma    Bowel obstruction (HCC)    Cancer (HCC)    Environmental allergies    GERD (gastroesophageal reflux disease)    Glaucoma 05/09/2017   H. pylori infection    History of radiation therapy 07/05/20-08/05/20   Left lung IMRT Dr. Shannon    HLD (hyperlipidemia) 01/16/2019   Hypertension    Iron  deficiency anemia    Osteopenia 05/26/2017   Thyroid  disease    Urticaria 07/25/2018   Allergies  Allergen Reactions   Asa Buff (Mag [Buffered Aspirin] Other (See Comments)    Excessive sweating   Ensure [Nutritional Supplements]     Or boost - upset stomach    Fish Allergy Other (See Comments)    Unknown reaction, but allergic   Other Other (See Comments)    Gelcaps; gel-containing capsules; extended-release medication - upset stomach    Peanut-Containing Drug Products Other (See Comments)    From allergy test   Penicillins Itching   Shellfish-Derived Products Other (See Comments)    From allergy test   Strawberry Extract Nausea And Vomiting and Other (See Comments)    From allergy test    Objective:  Angelica [name] is a pleasant 83 y.o. female in NAD. AAO x 3.  Vascular Examination: Patient has palpable DP pulse, absent PT pulse bilateral.  Delayed capillary refill bilateral toes.  Sparse digital hair bilateral.  Proximal to distal  cooling WNL bilateral.    Dermatological Examination: Interspaces are clear with no open lesions noted bilateral.  Skin is shiny and atrophic bilateral.  Nails are 3-61mm thick, with yellowish/brown discoloration, subungual debris and distal onycholysis x10.  There is pain with compression of nails x10.  There are hyperkeratotic lesions noted on the dorsal lateral left fifth toe PIPJ.  Patient qualifies for at-risk foot care because of PVD.  Assessment/Plan: 1. Pain due to onychomycosis of toenails of both feet   2. Corns   3. PVD (peripheral vascular disease) (HCC)     Mycotic nails x10 were sharply debrided with sterile nail nippers and power debriding burr to decrease bulk and length.  Hyperkeratotic lesion on the left fifth toe PIPJ was shaved with #312 blade.  Return in about 3 months (around 03/10/2024) for RFC.   Awanda CHARM Imperial, DPM, FACFAS [facility]     [address], [place] [address]                Office [phone]  Fax [phone]

## 2023-12-17 ENCOUNTER — Encounter: Payer: Self-pay | Admitting: Internal Medicine

## 2023-12-17 ENCOUNTER — Ambulatory Visit (INDEPENDENT_AMBULATORY_CARE_PROVIDER_SITE_OTHER): Payer: Medicare Other | Admitting: Internal Medicine

## 2023-12-17 VITALS — BP 106/72 | HR 64 | Ht 65.0 in | Wt 133.0 lb

## 2023-12-17 DIAGNOSIS — I10 Essential (primary) hypertension: Secondary | ICD-10-CM | POA: Diagnosis not present

## 2023-12-17 DIAGNOSIS — E7849 Other hyperlipidemia: Secondary | ICD-10-CM

## 2023-12-17 DIAGNOSIS — G309 Alzheimer's disease, unspecified: Secondary | ICD-10-CM

## 2023-12-17 DIAGNOSIS — E039 Hypothyroidism, unspecified: Secondary | ICD-10-CM

## 2023-12-17 DIAGNOSIS — F0280 Dementia in other diseases classified elsewhere without behavioral disturbance: Secondary | ICD-10-CM

## 2023-12-17 NOTE — Progress Notes (Unsigned)
 Patient ID: Angelica [name], female   DOB: 04-Apr-1941, 83 y.o.   MRN: [id number]        Chief Complaint: follow up htn, dementia, low thyroid , hld       HPI:  Angelica [name] is a 83 y.o. female here with daughter, Pt denies chest pain, increased sob or doe, wheezing, orthopnea, PND, increased LE swelling, palpitations, dizziness or syncope.   Pt denies polydipsia, polyuria, or new focal neuro s/s.    Pt denies fever, wt loss, night sweats, loss of appetite, or other constitutional symptoms  Dementia overall stable symptomatically, and not assoc with behavioral changes such as hallucinations, paranoia, or agitation.  Has fu appt oncology aug 21, with CT prior aug 7.  Allergy f/u in aug 2025 as well.  Wt Readings from Last 3 Encounters:  12/17/23 133 lb (60.3 kg)  10/30/23 135 lb (61.2 kg)  10/11/23 130 lb (59 kg)   BP Readings from Last 3 Encounters:  12/17/23 106/72  10/30/23 (!) 154/85  10/25/23 130/70         Past Medical History:  Diagnosis Date   Allergy 05/21/1978   Arthritis    Asthma    Bowel obstruction (HCC)    Cancer (HCC)    Cataract    Environmental allergies    GERD (gastroesophageal reflux disease)    Glaucoma 05/09/2017   H. pylori infection    History of radiation therapy 07/05/20-08/05/20   Left lung IMRT Dr. Shannon    HLD (hyperlipidemia) 01/16/2019   Hypertension    Iron  deficiency anemia    Osteopenia 05/26/2017   Thyroid  disease    Urticaria 07/25/2018   Past Surgical History:  Procedure Laterality Date   ABDOMINAL HYSTERECTOMY     BOWEL RESECTION N/A 05/23/2020   Procedure: SMALL BOWEL REPAIR;  Surgeon: Stevie, Herlene Righter, MD;  Location: [facility];  Service: General;  Laterality: N/A;   BREAST BIOPSY     BRONCHIAL BIOPSY  06/10/2020   Procedure: BRONCHIAL BIOPSIES;  Surgeon: Brenna Adine CROME, DO;  Location: [facility];  Service: Pulmonary;;   BRONCHIAL BRUSHINGS  06/10/2020   Procedure: BRONCHIAL BRUSHINGS;  Surgeon: Brenna Adine CROME, DO;   Location: [facility];  Service: Pulmonary;;   BRONCHIAL NEEDLE ASPIRATION BIOPSY  06/10/2020   Procedure: BRONCHIAL NEEDLE ASPIRATION BIOPSIES;  Surgeon: Brenna Adine CROME, DO;  Location: [facility];  Service: Pulmonary;;   BRONCHIAL WASHINGS  06/10/2020   Procedure: BRONCHIAL WASHINGS;  Surgeon: Brenna Adine CROME, DO;  Location: [facility];  Service: Pulmonary;;   COLON SURGERY     DIAGNOSTIC LARYNGOSCOPY N/A 05/23/2020   Procedure: ATTEMPTED DIAGNOSTIC LAPAROSCOPY WITH ADHESIONS;  Surgeon: Stevie, Herlene Righter, MD;  Location: [facility];  Service: General;  Laterality: N/A;   EYE SURGERY     IR IMAGING GUIDED PORT INSERTION  07/15/2020   IR REMOVAL TUN ACCESS W/ PORT W/O FL MOD SED  01/16/2023   KNEE SURGERY     LAPAROTOMY N/A 04/18/2015   Procedure: Exploratory laparotomy with lysis of adhesions, possible bowel resection;  Surgeon: Lynda Leos, MD;  Location: [facility];  Service: General;  Laterality: N/A;   LAPAROTOMY N/A 05/23/2020   Procedure: EXPLORATORY LAPAROTOMY;  Surgeon: Stevie Herlene Righter, MD;  Location: [facility];  Service: General;  Laterality: N/A;   LYSIS OF ADHESION N/A 05/23/2020   Procedure: LYSIS OF ADHESION;  Surgeon: Stevie Herlene Righter, MD;  Location: [facility];  Service: General;  Laterality: N/A;   NASAL SINUS SURGERY  SMALL INTESTINE SURGERY     THORACENTESIS Left 12/03/2022   Procedure: THORACENTESIS;  Surgeon: Brenna Adine CROME, DO;  Location: [facility];  Service: Pulmonary;  Laterality: Left;   THYROID  SURGERY     VIDEO BRONCHOSCOPY WITH ENDOBRONCHIAL NAVIGATION Bilateral 06/10/2020   Procedure: VIDEO BRONCHOSCOPY WITH ENDOBRONCHIAL NAVIGATION;  Surgeon: Brenna Adine CROME, DO;  Location: [facility];  Service: Pulmonary;  Laterality: Bilateral;   VIDEO BRONCHOSCOPY WITH ENDOBRONCHIAL ULTRASOUND  06/10/2020   Procedure: VIDEO BRONCHOSCOPY WITH ENDOBRONCHIAL ULTRASOUND;  Surgeon: Brenna Adine CROME, DO;  Location: [facility];  Service: Pulmonary;;    reports  that she quit smoking about 53 years ago. Her smoking use included cigarettes. She has been exposed to tobacco smoke. She has never used smokeless tobacco. She reports that she does not drink alcohol and does not use drugs. family history includes Diabetes in her mother; Glaucoma in her brother and sister; Hypertension in her father and mother. Allergies  Allergen Reactions   Asa Buff (Mag [Buffered Aspirin] Other (See Comments)    Excessive sweating   Ensure [Nutritional Supplements]     Or boost - upset stomach    Fish Allergy Other (See Comments)    Unknown reaction, but allergic   Other Other (See Comments)    Gelcaps; gel-containing capsules; extended-release medication - upset stomach    Peanut-Containing Drug Products Other (See Comments)    From allergy test   Penicillins Itching   Shellfish-Derived Products Other (See Comments)    From allergy test   Strawberry Extract Nausea And Vomiting and Other (See Comments)    From allergy test    Current Outpatient Medications on File Prior to Visit  Medication Sig Dispense Refill   acetaminophen  (TYLENOL ) 500 MG tablet Take 1,000 mg by mouth every 6 (six) hours as needed for moderate pain.     albuterol  (VENTOLIN  HFA) 108 (90 Base) MCG/ACT inhaler Inhale 2 puffs into the lungs every 4 (four) hours as needed for wheezing or shortness of breath. 18 g 1   alum & mag hydroxide-simeth (MAALOX/MYLANTA) 200-200-20 MG/5ML suspension Take 15 mLs by mouth every 6 (six) hours as needed for indigestion or heartburn.     amLODipine  (NORVASC ) 10 MG tablet Take 1 tablet (10 mg total) by mouth daily. 90 tablet 3   benralizumab  (FASENRA ) 30 MG/ML prefilled syringe Inject 1 mL (30 mg total) into the skin every 8 (eight) weeks. 1 mL 6   cetirizine  (EQ ALLERGY RELIEF, CETIRIZINE ,) 10 MG tablet Take 1 tablet by mouth once daily 30 tablet 1   clopidogrel  (PLAVIX ) 75 MG tablet Take 1 tablet by mouth once daily 90 tablet 3   diclofenac  Sodium (VOLTAREN ) 1 % GEL  Apply 4 g topically 4 (four) times daily. (Patient taking differently: Apply 4 g topically 4 (four) times daily as needed (pain).) 100 g 0   EPINEPHrine  (EPIPEN  2-PAK) 0.3 mg/0.3 mL IJ SOAJ injection Use as directed for severe allergic reaction 2 each 1   fluticasone  (FLONASE ) 50 MCG/ACT nasal spray USE 1 SPRAY(S) IN EACH NOSTRIL IN THE MORNING AND AT BEDTIME 16 g 0   gentamicin  ointment (GARAMYCIN ) 0.1 % Apply 1 Application topically daily. Apply to wound daily between toes.  Use a very small amount. 30 g 0   levothyroxine  (SYNTHROID ) 50 MCG tablet TAKE 1 TABLET BY MOUTH ONCE DAILY BEFORE BREAKFAST 90 tablet 3   Magnesium  Hydroxide (PHILLIPS MILK OF MAGNESIA PO) Take 15-30 mLs by mouth daily as needed (for constipation).  memantine  (NAMENDA ) 10 MG tablet Take 1 tablet (10 mg total) by mouth 2 (two) times daily. 180 tablet 3   ondansetron  (ZOFRAN -ODT) 4 MG disintegrating tablet Take 1 tablet (4 mg total) by mouth every 8 (eight) hours as needed for nausea or vomiting. 10 tablet 0   pantoprazole  (PROTONIX ) 40 MG tablet Take 1 tablet by mouth once daily 90 tablet 2   potassium chloride  SA (KLOR-CON  M) 20 MEQ tablet Take 2 tablets (40 mEq total) by mouth daily as needed. Take potasium days you take lasix . 60 tablet 5   pravastatin  (PRAVACHOL ) 80 MG tablet Take 1 tablet by mouth once daily 90 tablet 3   SYMBICORT  80-4.5 MCG/ACT inhaler INHALE 2 PUFFS BY MOUTH IN THE MORNING AND AT BEDTIME 11 g 0   Current Facility-Administered Medications on File Prior to Visit  Medication Dose Route Frequency Provider Last Rate Last Admin   Benralizumab  SOSY 30 mg  30 mg Subcutaneous Q8 Weeks Jeneal Danita Macintosh, MD   30 mg at 11/20/23 0945        ROS:  All others reviewed and negative.  Objective        PE:  BP 106/72   Pulse 64   Ht 5' 5 (1.651 m)   Wt 133 lb (60.3 kg)   SpO2 99%   BMI 22.13 kg/m                 Constitutional: Pt appears in NAD               HENT: Head: NCAT.                 Right Ear: External ear normal.                 Left Ear: External ear normal.                Eyes: . Pupils are equal, round, and reactive to light. Conjunctivae and EOM are normal               Nose: without d/c or deformity               Neck: Neck supple. Gross normal ROM               Cardiovascular: Normal rate and regular rhythm.                 Pulmonary/Chest: Effort normal and breath sounds without rales or wheezing.                Abd:  Soft, NT, ND, + BS, no organomegaly               Neurological: Pt is alert. At baseline orientation, motor grossly intact               Skin: Skin is warm. No rashes, no other new lesions, LE edema - none               Psychiatric: Pt behavior is normal without agitation   Micro: none  Cardiac tracings I have personally interpreted today:  none  Pertinent Radiological findings (summarize): none   Lab Results  Component Value Date   WBC 4.4 10/09/2023   HGB 12.2 10/09/2023   HCT 37.3 10/09/2023   PLT 162 10/09/2023   GLUCOSE 97 10/09/2023   CHOL 191 07/18/2021   TRIG 83.0 07/18/2021   HDL 77.50 07/18/2021   LDLCALC 97 07/18/2021   ALT 16 10/09/2023  AST 25 10/09/2023   NA 135 10/09/2023   K 3.4 (L) 10/09/2023   CL 105 10/09/2023   CREATININE 0.81 10/09/2023   BUN 12 10/09/2023   CO2 24 10/09/2023   TSH 2.400 04/10/2023   INR 1.0 11/02/2020   HGBA1C 5.8 07/18/2021   Assessment/Plan:  LAURETTE VILLESCAS is a 83 y.o. Black or African American [2] female with  has a past medical history of Allergy (05/21/1978), Arthritis, Asthma, Bowel obstruction (HCC), Cancer (HCC), Cataract, Environmental allergies, GERD (gastroesophageal reflux disease), Glaucoma (05/09/2017), H. pylori infection, History of radiation therapy (07/05/20-08/05/20), HLD (hyperlipidemia) (01/16/2019), Hypertension, Iron  deficiency anemia, Osteopenia (05/26/2017), Thyroid  disease, and Urticaria (07/25/2018).  Alzheimer's disease (HCC) Stable overall, cont current med  tx  Hypothyroidism Lab Results  Component Value Date   TSH 2.400 04/10/2023   Stable, pt to continue levothyroxine  50 mcg qd   HTN (hypertension) BP Readings from Last 3 Encounters:  12/17/23 106/72  10/30/23 (!) 154/85  10/25/23 130/70   Stable, pt to continue medical treatment norvasc  10 mg qd   HLD (hyperlipidemia) Lab Results  Component Value Date   LDLCALC 97 07/18/2021   Stable, pt to continue current statin pravachol  80 mg qd  Followup: Return in about 6 months (around 06/18/2024).  Lynwood Rush, MD 12/19/2023 8:06 PM [facility] Internal Medicine

## 2023-12-17 NOTE — Patient Instructions (Signed)
 Please continue all other medications as before, and refills have been done if requested.  Please have the pharmacy call with any other refills you may need.  Please continue your efforts at being more active, low cholesterol diet, and weight control.  Please keep your appointments with your specialists as you may have planned - lab testing, CT chest and Oncology and Allergy soon  Please make an Appointment to return in 6 months, or sooner if needed

## 2023-12-18 ENCOUNTER — Other Ambulatory Visit: Payer: Self-pay | Admitting: Allergy

## 2023-12-19 ENCOUNTER — Encounter: Payer: Self-pay | Admitting: Internal Medicine

## 2023-12-19 NOTE — Assessment & Plan Note (Signed)
Stable overall, cont current med tx 

## 2023-12-19 NOTE — Assessment & Plan Note (Signed)
 Lab Results  Component Value Date   TSH 2.400 04/10/2023   Stable, pt to continue levothyroxine 50 mcg qd

## 2023-12-19 NOTE — Assessment & Plan Note (Signed)
 Lab Results  Component Value Date   LDLCALC 97 07/18/2021   Stable, pt to continue current statin pravachol  80 mg qd

## 2023-12-19 NOTE — Assessment & Plan Note (Signed)
 BP Readings from Last 3 Encounters:  12/17/23 106/72  10/30/23 (!) 154/85  10/25/23 130/70   Stable, pt to continue medical treatment norvasc  10 mg qd

## 2023-12-26 ENCOUNTER — Emergency Department (HOSPITAL_COMMUNITY)
Admission: EM | Admit: 2023-12-26 | Discharge: 2023-12-26 | Disposition: A | Discharge status: Home / Self Care | Attending: Emergency Medicine | Admitting: Emergency Medicine

## 2023-12-26 ENCOUNTER — Other Ambulatory Visit: Payer: Self-pay

## 2023-12-26 ENCOUNTER — Encounter (HOSPITAL_COMMUNITY): Payer: Self-pay

## 2023-12-26 DIAGNOSIS — Z91010 Allergy to peanuts: Secondary | ICD-10-CM | POA: Diagnosis not present

## 2023-12-26 DIAGNOSIS — F4320 Adjustment disorder, unspecified: Secondary | ICD-10-CM | POA: Diagnosis not present

## 2023-12-26 DIAGNOSIS — I10 Essential (primary) hypertension: Secondary | ICD-10-CM | POA: Insufficient documentation

## 2023-12-26 DIAGNOSIS — Z79899 Other long term (current) drug therapy: Secondary | ICD-10-CM | POA: Insufficient documentation

## 2023-12-26 NOTE — ED Triage Notes (Addendum)
 Patient stated her sister passed away 1 hour ago upstairs. Feels very anxious, tearful and overwhelmed. The tech checked her vitals and her BP was 180s. They recommended her to get checked out.

## 2023-12-26 NOTE — ED Provider Notes (Signed)
 Angelica [name] EMERGENCY DEPARTMENT AT [facility] Provider Note   CSN: [id number] Arrival date & time: 12/26/23  8162     Patient presents with: Anxiety   Angelica [name] is a 83 y.o. female.   83 year old female presents with increased blood pressure after a relative who recently became deceased.  Patient's relative died about an hour ago.  Patient was very anxious, tearful.  Blood pressure was checked upstairs and her systolic was in the 180s.  Patient has outpatient hypertension and does take amlodipine .  She did not have any headache or shortness of breath or chest pain.  No focal neurological weakness.  Feels better at this time without intervention       Prior to Admission medications   Medication Sig Start Date End Date Taking? Authorizing Provider  acetaminophen  (TYLENOL ) 500 MG tablet Take 1,000 mg by mouth every 6 (six) hours as needed for moderate pain.    [provider]  albuterol  (VENTOLIN  HFA) 108 (90 Base) MCG/ACT inhaler Inhale 2 puffs into the lungs every 4 (four) hours as needed for wheezing or shortness of breath. 10/04/22   Jeneal Danita Macintosh, MD  alum & mag hydroxide-simeth (MAALOX/MYLANTA) 200-200-20 MG/5ML suspension Take 15 mLs by mouth every 6 (six) hours as needed for indigestion or heartburn.    [provider]  amLODipine  (NORVASC ) 10 MG tablet Take 1 tablet (10 mg total) by mouth daily. 06/17/23   Norleen Lynwood ORN, MD  benralizumab  (FASENRA ) 30 MG/ML prefilled syringe Inject 1 mL (30 mg total) into the skin every 8 (eight) weeks. 07/15/23   Jeneal Danita Macintosh, MD  cetirizine  [facility] ALLERGY RELIEF, CETIRIZINE ,) 10 MG tablet Take 1 tablet by mouth once daily 11/27/23   Jeneal Danita Macintosh, MD  clopidogrel  (PLAVIX ) 75 MG tablet Take 1 tablet by mouth once daily 07/18/23   Norleen Lynwood ORN, MD  diclofenac  Sodium (VOLTAREN ) 1 % GEL Apply 4 g topically 4 (four) times daily. Patient taking differently: Apply 4 g topically 4 (four)  times daily as needed (pain). 04/23/22   Corey, Evan S, MD  EPINEPHrine  (EPIPEN  2-PAK) 0.3 mg/0.3 mL IJ SOAJ injection Use as directed for severe allergic reaction 09/24/22   Merlynn Niki FALCON, FNP  fluticasone  (FLONASE ) 50 MCG/ACT nasal spray USE 1 SPRAY(S) IN EACH NOSTRIL IN THE MORNING AND AT BEDTIME 10/03/23   Jeneal Danita Macintosh, MD  gentamicin  ointment (GARAMYCIN ) 0.1 % Apply 1 Application topically daily. Apply to wound daily between toes.  Use a very small amount. 08/27/23   McCaughan, Dia D, DPM  ipratropium (ATROVENT ) 0.06 % nasal spray USE 2 SPRAY(S) IN EACH NOSTRIL 4 TIMES DAILY AS NEEDED 12/18/23   Jeneal Danita Macintosh, MD  levothyroxine  (SYNTHROID ) 50 MCG tablet TAKE 1 TABLET BY MOUTH ONCE DAILY BEFORE BREAKFAST 01/09/23   Norleen Lynwood ORN, MD  Magnesium  Hydroxide (PHILLIPS MILK OF MAGNESIA PO) Take 15-30 mLs by mouth daily as needed (for constipation).    [provider]  memantine  (NAMENDA ) 10 MG tablet Take 1 tablet (10 mg total) by mouth 2 (two) times daily. 10/30/23   Whitfield Raisin, NP  montelukast  (SINGULAIR ) 10 MG tablet TAKE 1 TABLET BY MOUTH AT BEDTIME 12/18/23   Jeneal Danita Macintosh, MD  ondansetron  (ZOFRAN -ODT) 4 MG disintegrating tablet Take 1 tablet (4 mg total) by mouth every 8 (eight) hours as needed for nausea or vomiting. 07/09/22   Merlynn Niki FALCON, FNP  pantoprazole  (PROTONIX ) 40 MG tablet Take 1 tablet by mouth once daily 12/24/22  Norleen Lynwood ORN, MD  potassium chloride  SA (KLOR-CON  M) 20 MEQ tablet Take 2 tablets (40 mEq total) by mouth daily as needed. Take potasium days you take lasix . 01/03/23   Norleen Lynwood ORN, MD  pravastatin  (PRAVACHOL ) 80 MG tablet Take 1 tablet by mouth once daily 04/29/23   Norleen Lynwood ORN, MD  SYMBICORT  80-4.5 MCG/ACT inhaler INHALE 2 PUFFS BY MOUTH IN THE MORNING AND AT BEDTIME 10/16/23   Jeneal Danita Macintosh, MD    Allergies: Dorethia buff (mag [buffered aspirin], Ensure [nutritional supplements], Fish allergy, Other, Peanut-containing  drug products, Penicillins, Shellfish-derived products, and Strawberry extract    Review of Systems  All other systems reviewed and are negative.   Updated Vital Signs BP (!) 162/82 (BP Location: Left Arm)   Pulse 67   Temp 98.4 F (36.9 C) (Oral)   Resp 18   Ht 1.651 m (5' 5)   Wt 60 kg   SpO2 100%   BMI 22.01 kg/m   Physical Exam Vitals and nursing note reviewed.  Constitutional:      General: She is not in acute distress.    Appearance: Normal appearance. She is well-developed. She is not toxic-appearing.  HENT:     Head: Normocephalic and atraumatic.  Eyes:     General: Lids are normal.     Conjunctiva/sclera: Conjunctivae normal.     Pupils: Pupils are equal, round, and reactive to light.  Neck:     Thyroid : No thyroid  mass.     Trachea: No tracheal deviation.  Cardiovascular:     Rate and Rhythm: Normal rate and regular rhythm.     Heart sounds: Normal heart sounds. No murmur heard.    No gallop.  Pulmonary:     Effort: Pulmonary effort is normal. No respiratory distress.     Breath sounds: Normal breath sounds. No stridor. No decreased breath sounds, wheezing, rhonchi or rales.  Abdominal:     General: There is no distension.     Palpations: Abdomen is soft.     Tenderness: There is no abdominal tenderness. There is no rebound.  Musculoskeletal:        General: No tenderness. Normal range of motion.     Cervical back: Normal range of motion and neck supple.  Skin:    General: Skin is warm and dry.     Findings: No abrasion or rash.  Neurological:     Mental Status: She is alert and oriented to person, place, and time. Mental status is at baseline.     GCS: GCS eye subscore is 4. GCS verbal subscore is 5. GCS motor subscore is 6.     Cranial Nerves: No cranial nerve deficit.     Sensory: No sensory deficit.     Motor: Motor function is intact.  Psychiatric:        Attention and Perception: Attention normal.        Speech: Speech normal.         Behavior: Behavior normal.     (all labs ordered are listed, but only abnormal results are displayed) Labs Reviewed - No data to display  EKG: None  Radiology: No results found.   Procedures   Medications Ordered in the ED - No data to display                                  Medical Decision Making  Patient's blood pressure has improved  at this time.  Patient's last blood pressure in the office according to family at bedside it was 160/70.  At time of discharge it is around the same number.  Do not feel that she needs to have any labs or imaging at this time.  Family is in agreement.     Final diagnoses:  None    ED Discharge Orders     None          Dasie Faden, MD 12/26/23 1911

## 2023-12-27 ENCOUNTER — Other Ambulatory Visit: Payer: Self-pay | Admitting: Allergy

## 2023-12-27 ENCOUNTER — Inpatient Hospital Stay: Attending: Internal Medicine

## 2023-12-27 ENCOUNTER — Ambulatory Visit
Admission: RE | Admit: 2023-12-27 | Discharge: 2023-12-27 | Disposition: A | Discharge status: Home / Self Care | Payer: Medicare Other | Source: Ambulatory Visit | Attending: Internal Medicine | Admitting: Internal Medicine

## 2023-12-27 DIAGNOSIS — Z923 Personal history of irradiation: Secondary | ICD-10-CM | POA: Insufficient documentation

## 2023-12-27 DIAGNOSIS — Z9221 Personal history of antineoplastic chemotherapy: Secondary | ICD-10-CM | POA: Insufficient documentation

## 2023-12-27 DIAGNOSIS — C3490 Malignant neoplasm of unspecified part of unspecified bronchus or lung: Secondary | ICD-10-CM

## 2023-12-27 DIAGNOSIS — C3432 Malignant neoplasm of lower lobe, left bronchus or lung: Secondary | ICD-10-CM | POA: Insufficient documentation

## 2023-12-27 DIAGNOSIS — C3492 Malignant neoplasm of unspecified part of left bronchus or lung: Secondary | ICD-10-CM

## 2023-12-27 LAB — CBC WITH DIFFERENTIAL (CANCER CENTER ONLY)
Abs Immature Granulocytes: 0.01 K/uL (ref 0.00–0.07)
Basophils Absolute: 0 K/uL (ref 0.0–0.1)
Basophils Relative: 1 %
Eosinophils Absolute: 0 K/uL (ref 0.0–0.5)
Eosinophils Relative: 0 %
HCT: 39.7 % (ref 36.0–46.0)
Hemoglobin: 13.6 g/dL (ref 12.0–15.0)
Immature Granulocytes: 0 %
Lymphocytes Relative: 33 %
Lymphs Abs: 1.5 K/uL (ref 0.7–4.0)
MCH: 30.6 pg (ref 26.0–34.0)
MCHC: 34.3 g/dL (ref 30.0–36.0)
MCV: 89.2 fL (ref 80.0–100.0)
Monocytes Absolute: 0.5 K/uL (ref 0.1–1.0)
Monocytes Relative: 10 %
Neutro Abs: 2.7 K/uL (ref 1.7–7.7)
Neutrophils Relative %: 56 %
Platelet Count: 197 K/uL (ref 150–400)
RBC: 4.45 MIL/uL (ref 3.87–5.11)
RDW: 13.6 % (ref 11.5–15.5)
WBC Count: 4.7 K/uL (ref 4.0–10.5)
nRBC: 0 % (ref 0.0–0.2)

## 2023-12-27 LAB — CMP (CANCER CENTER ONLY)
ALT: 15 U/L (ref 0–44)
AST: 24 U/L (ref 15–41)
Albumin: 4.1 g/dL (ref 3.5–5.0)
Alkaline Phosphatase: 72 U/L (ref 38–126)
Anion gap: 5 (ref 5–15)
BUN: 14 mg/dL (ref 8–23)
CO2: 29 mmol/L (ref 22–32)
Calcium: 9.4 mg/dL (ref 8.9–10.3)
Chloride: 104 mmol/L (ref 98–111)
Creatinine: 0.96 mg/dL (ref 0.44–1.00)
GFR, Estimated: 58 mL/min — ABNORMAL LOW (ref >60)
Glucose, Bld: 88 mg/dL (ref 70–99)
Potassium: 4.1 mmol/L (ref 3.5–5.1)
Sodium: 138 mmol/L (ref 135–145)
Total Bilirubin: 0.7 mg/dL (ref 0.0–1.2)
Total Protein: 7.4 g/dL (ref 6.5–8.1)

## 2023-12-27 MED ORDER — IOPAMIDOL (ISOVUE-300) INJECTION 61%
80.0000 mL | Freq: Once | INTRAVENOUS | Status: AC | PRN
  Administered 2023-12-27: 80 mL via INTRAVENOUS

## 2023-12-28 ENCOUNTER — Other Ambulatory Visit: Payer: Self-pay | Admitting: Internal Medicine

## 2024-01-01 ENCOUNTER — Other Ambulatory Visit: Payer: Self-pay | Admitting: Allergy

## 2024-01-03 ENCOUNTER — Other Ambulatory Visit: Payer: Self-pay

## 2024-01-03 ENCOUNTER — Encounter: Payer: Self-pay | Admitting: Allergy

## 2024-01-03 ENCOUNTER — Ambulatory Visit: Admitting: Allergy

## 2024-01-03 VITALS — BP 130/88 | HR 64 | Temp 98.3°F | Ht 63.78 in | Wt 131.9 lb

## 2024-01-03 DIAGNOSIS — J310 Chronic rhinitis: Secondary | ICD-10-CM

## 2024-01-03 DIAGNOSIS — J339 Nasal polyp, unspecified: Secondary | ICD-10-CM

## 2024-01-03 DIAGNOSIS — J4550 Severe persistent asthma, uncomplicated: Secondary | ICD-10-CM | POA: Diagnosis not present

## 2024-01-03 DIAGNOSIS — K219 Gastro-esophageal reflux disease without esophagitis: Secondary | ICD-10-CM | POA: Diagnosis not present

## 2024-01-03 MED ORDER — ALBUTEROL SULFATE HFA 108 (90 BASE) MCG/ACT IN AERS: 2.0000 | INHALATION_SPRAY | RESPIRATORY_TRACT | 1 refills | Status: DC | PRN

## 2024-01-03 MED ORDER — MONTELUKAST SODIUM 10 MG PO TABS: 10.0000 mg | ORAL_TABLET | Freq: Every day | ORAL | 3 refills | Status: DC

## 2024-01-03 MED ORDER — SYMBICORT 80-4.5 MCG/ACT IN AERO: INHALATION_SPRAY | RESPIRATORY_TRACT | 11 refills | Status: DC

## 2024-01-03 MED ORDER — EPINEPHRINE 0.3 MG/0.3ML IJ SOAJ: INTRAMUSCULAR | 1 refills | Status: AC

## 2024-01-03 MED ORDER — CETIRIZINE HCL 10 MG PO TABS: 10.0000 mg | ORAL_TABLET | Freq: Every day | ORAL | 3 refills | Status: DC

## 2024-01-03 MED ORDER — IPRATROPIUM BROMIDE 0.06 % NA SOLN: 2.0000 | Freq: Three times a day (TID) | NASAL | 11 refills | Status: AC | PRN

## 2024-01-03 NOTE — Progress Notes (Signed)
 Follow-up Note  RE: Angelica [name] MRN: [id number] DOB: [birth year] Date of Office Visit: 01/03/2024   History of present illness: Angelica [name] is a 83 y.o. female presenting today for follow-up of asthma, rhinitis with nasal polyposis, urticaria and LPRD.  She was last seen in the office on 10/04/2022 by myself.  She presents today with her daughter. Discussed the use of AI scribe software for clinical note transcription with the patient, who gave verbal consent to proceed.  Her asthma has been stable over the past year. She uses Symbicort , taking two puffs in the morning, and has not needed to use a rescue inhaler. She receives Fasenra  injections every eight weeks and continues to tolerate this well.  She takes Singulair  (montelukast ) regularly. No urgent care visits or emergency department visits for breathing issues, nor has she needed prednisone  or steroid shots.  Her allergies have been manageable this year despite unusual pollen and mold levels. She uses a nasal spray every morning, which helps control her symptoms, and takes Zyrtec  (cetirizine ) for her allergies. Her allergies have not been worse than the previous year.  She mentions having a follow-up with her oncologist next Tuesday but she continues to be in just observation status.  She has been diagnosed with dementia.    Review of systems: 10pt ROS negative unless noted above in HPI  Past medical/social/surgical/family history have been reviewed and are unchanged unless specifically indicated below.  No changes  Medication List: Current Outpatient Medications  Medication Sig Dispense Refill   acetaminophen  (TYLENOL ) 500 MG tablet Take 1,000 mg by mouth every 6 (six) hours as needed for moderate pain.     alum & mag hydroxide-simeth (MAALOX/MYLANTA) 200-200-20 MG/5ML suspension Take 15 mLs by mouth every 6 (six) hours as needed for indigestion or heartburn.     amLODipine  (NORVASC ) 10 MG tablet Take 1 tablet (10 mg  total) by mouth daily. 90 tablet 3   benralizumab  (FASENRA ) 30 MG/ML prefilled syringe Inject 1 mL (30 mg total) into the skin every 8 (eight) weeks. 1 mL 6   clopidogrel  (PLAVIX ) 75 MG tablet Take 1 tablet by mouth once daily 90 tablet 3   diclofenac  Sodium (VOLTAREN ) 1 % GEL Apply 4 g topically 4 (four) times daily. (Patient taking differently: Apply 4 g topically 4 (four) times daily as needed (pain).) 100 g 0   fluticasone  (FLONASE ) 50 MCG/ACT nasal spray USE 1 SPRAY(S) IN EACH NOSTRIL IN THE MORNING AND AT BEDTIME 16 g 0   levothyroxine  (SYNTHROID ) 50 MCG tablet TAKE 1 TABLET BY MOUTH ONCE DAILY BEFORE BREAKFAST 90 tablet 3   Magnesium  Hydroxide (PHILLIPS MILK OF MAGNESIA PO) Take 15-30 mLs by mouth daily as needed (for constipation).     memantine  (NAMENDA ) 10 MG tablet Take 1 tablet (10 mg total) by mouth 2 (two) times daily. 180 tablet 3   ondansetron  (ZOFRAN -ODT) 4 MG disintegrating tablet Take 1 tablet (4 mg total) by mouth every 8 (eight) hours as needed for nausea or vomiting. 10 tablet 0   pantoprazole  (PROTONIX ) 40 MG tablet Take 1 tablet by mouth once daily 90 tablet 0   pravastatin  (PRAVACHOL ) 80 MG tablet Take 1 tablet by mouth once daily 90 tablet 3   albuterol  (VENTOLIN  HFA) 108 (90 Base) MCG/ACT inhaler Inhale 2 puffs into the lungs every 4 (four) hours as needed for wheezing or shortness of breath. 18 g 1   cetirizine  (EQ ALLERGY RELIEF, CETIRIZINE ,) 10 MG tablet Take 1 tablet (10 mg  total) by mouth daily. 90 tablet 3   EPINEPHrine  (EPIPEN  2-PAK) 0.3 mg/0.3 mL IJ SOAJ injection Use as directed for severe allergic reaction 2 each 1   gentamicin  ointment (GARAMYCIN ) 0.1 % Apply 1 Application topically daily. Apply to wound daily between toes.  Use a very small amount. (Patient not taking: Reported on 01/03/2024) 30 g 0   ipratropium (ATROVENT ) 0.06 % nasal spray Place 2 sprays into both nostrils 3 (three) times daily as needed for rhinitis. 15 mL 11   montelukast  (SINGULAIR ) 10 MG  tablet Take 1 tablet (10 mg total) by mouth at bedtime. 90 tablet 3   potassium chloride  SA (KLOR-CON  M) 20 MEQ tablet Take 2 tablets (40 mEq total) by mouth daily as needed. Take potasium days you take lasix . (Patient not taking: Reported on 01/03/2024) 60 tablet 5   SYMBICORT  80-4.5 MCG/ACT inhaler INHALE 2 PUFFS BY MOUTH IN THE MORNING AND AT BEDTIME 11 g 5   SYMBICORT  80-4.5 MCG/ACT inhaler INHALE 2 PUFFS BY MOUTH IN THE MORNING 10.2 g 11   Current Facility-Administered Medications  Medication Dose Route Frequency Provider Last Rate Last Admin   Benralizumab  SOSY 30 mg  30 mg Subcutaneous Q8 Weeks Macallan Ord Patricia, MD   30 mg at 11/20/23 0945     Known medication allergies: Allergies  Allergen Reactions   Asa Buff (Mag [Buffered Aspirin] Other (See Comments)    Excessive sweating   Ensure [Nutritional Supplements]     Or boost - upset stomach    Fish Allergy Other (See Comments)    Unknown reaction, but allergic   Other Other (See Comments)    Gelcaps; gel-containing capsules; extended-release medication - upset stomach    Peanut-Containing Drug Products Other (See Comments)    From allergy test   Penicillins Itching   Shellfish-Derived Products Other (See Comments)    From allergy test   Strawberry Extract Nausea And Vomiting and Other (See Comments)    From allergy test      Physical examination: Blood pressure 130/88, pulse 64, temperature 98.3 F (36.8 C), height 5' 3.78 (1.62 m), weight 131 lb 14.4 oz (59.8 kg), SpO2 99%.  General: Alert, interactive, in no acute distress. HEENT: PERRLA, TMs pearly gray, turbinates non-edematous without discharge, post-pharynx non erythematous. Neck: Supple without lymphadenopathy. Lungs: Clear to auscultation without wheezing, rhonchi or rales. {no increased work of breathing. CV: Normal S1, S2 without murmurs. Abdomen: Nondistended, nontender. Skin: Warm and dry, without lesions or rashes. Extremities:  No clubbing,  cyanosis or edema. Neuro:   Grossly intact.  Diagnostics/Labs: None today  Assessment and plan: Allergic rhinitis/chronic rhinitis with history of nasal polyposis Severe persistent asthma LPRD Urticaria  1. Continue Symbicort  2 puffs once a day for asthma maintenance control.   If having a respiratory illness or flare increase Symbicort  to 2 puffs twice a day for 1-2 weeks or until symptoms have resolved.   2. Continue Fasenra  injections every 8 weeks.     3. Use Ipratropium 0.06% 2 sprays each nostril as needed for nasal drainage.  Can use up to 3-4 times a day as needed.   4. Continue Singulair  10mg   5. Zyrtec  10mg  daily.  Can take extra 1/2 tab-1 tab of Zyrtec  a day if needed for additional symptom control  6.  If needed:   A.  Ventolin  HFA 2 puffs or albuterol  nebulization every 4-6 hours  B.  Nasal saline spray to help moisturize nose     7. Return to clinic in  12 months or earlier if problem  I appreciate the opportunity to take part in Angelica [name]'s care. Please do not hesitate to contact me with questions.  Sincerely,   Danita Brain, MD Allergy/Immunology [facility]

## 2024-01-03 NOTE — Patient Instructions (Addendum)
 1. Continue Symbicort  2 puffs once a day for asthma maintenance control.   If having a respiratory illness or flare increase Symbicort  to 2 puffs twice a day for 1-2 weeks or until symptoms have resolved.   2. Continue Fasenra  injections every 8 weeks.     3. Use Ipratropium 0.06% 2 sprays each nostril as needed for nasal drainage.  Can use up to 3-4 times a day as needed.   4. Continue Singulair  10mg   5. Zyrtec  10mg  daily.  Can take extra 1/2 tab-1 tab of Zyrtec  a day if needed for additional symptom control  6.  If needed:   A.  Ventolin  HFA 2 puffs or albuterol  nebulization every 4-6 hours  B.  Nasal saline spray to help moisturize nose     7. Return to clinic in 12 months or earlier if problem

## 2024-01-07 ENCOUNTER — Inpatient Hospital Stay (HOSPITAL_BASED_OUTPATIENT_CLINIC_OR_DEPARTMENT_OTHER): Admitting: Internal Medicine

## 2024-01-07 VITALS — BP 137/58 | HR 77 | Temp 97.7°F | Resp 16 | Ht 63.78 in | Wt 130.0 lb

## 2024-01-07 DIAGNOSIS — Z923 Personal history of irradiation: Secondary | ICD-10-CM | POA: Diagnosis not present

## 2024-01-07 DIAGNOSIS — Z9221 Personal history of antineoplastic chemotherapy: Secondary | ICD-10-CM | POA: Diagnosis not present

## 2024-01-07 DIAGNOSIS — C3432 Malignant neoplasm of lower lobe, left bronchus or lung: Secondary | ICD-10-CM | POA: Diagnosis not present

## 2024-01-07 DIAGNOSIS — C3490 Malignant neoplasm of unspecified part of unspecified bronchus or lung: Secondary | ICD-10-CM | POA: Diagnosis not present

## 2024-01-07 NOTE — Progress Notes (Signed)
 [facility] [phone]   [phone]  OFFICE PROGRESS NOTE  Angelica Lynwood ORN, MD [address] [place] [address]  DIAGNOSIS: Stage IIIB (T2 a, N3, M0) non-small cell lung cancer, adenocarcinoma presented with left lower lobe lung mass in addition to left hilar and mediastinal as well as right and left cervical lymphadenopathy diagnosed in January 2022  PRIOR THERAPY:  1) Concurrent chemoradiation with weekly carboplatin  for AUC of 2 and paclitaxel  45 mg/M2.  Status post 5 cycles.  Last dose was given August 01, 2020. 2) Consolidation treatment with immunotherapy with Imfinzi  1500 mg IV every 4 weeks.  First dose September 06, 2020.  Status post 3 cycles.  This treatment was discontinued secondary to intolerance and hospital admission with sepsis.  CURRENT THERAPY: Observation.  INTERVAL HISTORY: Angelica [name] 83 y.o. female returns to the clinic today for 68-month follow-up visit accompanied by her daughter.Discussed the use of AI scribe software for clinical note transcription with the patient, who gave verbal consent to proceed.  History of Present Illness Angelica [name] is an 83 year old female with stage 3B non-small cell lung cancer who presents for evaluation with repeat CT scan for restaging of her disease. She is accompanied by her daughter.  She was diagnosed with stage 3B non-small cell lung cancer, adenocarcinoma, in January 2022. She underwent concurrent chemoradiation with weekly carboplatin  and paclitaxel , followed by three cycles of consolidation treatment with immunotherapy using Imfinzi , which was discontinued due to intolerance. Since July 2022, she has been on observation.  Over the last six months, she has experienced no chest pain, weight loss, night sweats, or headaches. She eats well and feels well overall. Her daughter mentioned that her breathing was noted to be 'really good' by her allergy and asthma doctor during a  recent appointment.    MEDICAL HISTORY: Past Medical History:  Diagnosis Date   Allergy 05/21/1978   Arthritis    Asthma    Bowel obstruction (HCC)    Cancer (HCC)    Cataract    Environmental allergies    GERD (gastroesophageal reflux disease)    Glaucoma 05/09/2017   H. pylori infection    History of radiation therapy 07/05/20-08/05/20   Left lung IMRT Dr. Shannon    HLD (hyperlipidemia) 01/16/2019   Hypertension    Iron  deficiency anemia    Osteopenia 05/26/2017   Thyroid  disease    Urticaria 07/25/2018    ALLERGIES:  is allergic to asa buff (mag [buffered aspirin], ensure [nutritional supplements], fish allergy, other, peanut-containing drug products, penicillins, shellfish-derived products, and strawberry extract.  MEDICATIONS:  Current Outpatient Medications  Medication Sig Dispense Refill   acetaminophen  (TYLENOL ) 500 MG tablet Take 1,000 mg by mouth every 6 (six) hours as needed for moderate pain.     albuterol  (VENTOLIN  HFA) 108 (90 Base) MCG/ACT inhaler Inhale 2 puffs into the lungs every 4 (four) hours as needed for wheezing or shortness of breath. 18 g 1   alum & mag hydroxide-simeth (MAALOX/MYLANTA) 200-200-20 MG/5ML suspension Take 15 mLs by mouth every 6 (six) hours as needed for indigestion or heartburn.     amLODipine  (NORVASC ) 10 MG tablet Take 1 tablet (10 mg total) by mouth daily. 90 tablet 3   benralizumab  (FASENRA ) 30 MG/ML prefilled syringe Inject 1 mL (30 mg total) into the skin every 8 (eight) weeks. 1 mL 6   cetirizine  (EQ ALLERGY RELIEF, CETIRIZINE ,) 10 MG tablet Take 1 tablet (10 mg  total) by mouth daily. 90 tablet 3   clopidogrel  (PLAVIX ) 75 MG tablet Take 1 tablet by mouth once daily 90 tablet 3   diclofenac  Sodium (VOLTAREN ) 1 % GEL Apply 4 g topically 4 (four) times daily. (Patient taking differently: Apply 4 g topically 4 (four) times daily as needed (pain).) 100 g 0   EPINEPHrine  (EPIPEN  2-PAK) 0.3 mg/0.3 mL IJ SOAJ injection Use as directed  for severe allergic reaction 2 each 1   fluticasone  (FLONASE ) 50 MCG/ACT nasal spray USE 1 SPRAY(S) IN EACH NOSTRIL IN THE MORNING AND AT BEDTIME 16 g 0   gentamicin  ointment (GARAMYCIN ) 0.1 % Apply 1 Application topically daily. Apply to wound daily between toes.  Use a very small amount. (Patient not taking: Reported on 01/03/2024) 30 g 0   ipratropium (ATROVENT ) 0.06 % nasal spray Place 2 sprays into both nostrils 3 (three) times daily as needed for rhinitis. 15 mL 11   levothyroxine  (SYNTHROID ) 50 MCG tablet TAKE 1 TABLET BY MOUTH ONCE DAILY BEFORE BREAKFAST 90 tablet 3   Magnesium  Hydroxide (PHILLIPS MILK OF MAGNESIA PO) Take 15-30 mLs by mouth daily as needed (for constipation).     memantine  (NAMENDA ) 10 MG tablet Take 1 tablet (10 mg total) by mouth 2 (two) times daily. 180 tablet 3   montelukast  (SINGULAIR ) 10 MG tablet Take 1 tablet (10 mg total) by mouth at bedtime. 90 tablet 3   ondansetron  (ZOFRAN -ODT) 4 MG disintegrating tablet Take 1 tablet (4 mg total) by mouth every 8 (eight) hours as needed for nausea or vomiting. 10 tablet 0   pantoprazole  (PROTONIX ) 40 MG tablet Take 1 tablet by mouth once daily 90 tablet 0   potassium chloride  SA (KLOR-CON  M) 20 MEQ tablet Take 2 tablets (40 mEq total) by mouth daily as needed. Take potasium days you take lasix . (Patient not taking: Reported on 01/03/2024) 60 tablet 5   pravastatin  (PRAVACHOL ) 80 MG tablet Take 1 tablet by mouth once daily 90 tablet 3   SYMBICORT  80-4.5 MCG/ACT inhaler INHALE 2 PUFFS BY MOUTH IN THE MORNING AND AT BEDTIME 11 g 5   SYMBICORT  80-4.5 MCG/ACT inhaler INHALE 2 PUFFS BY MOUTH IN THE MORNING 10.2 g 11   Current Facility-Administered Medications  Medication Dose Route Frequency Provider Last Rate Last Admin   Benralizumab  SOSY 30 mg  30 mg Subcutaneous Q8 Weeks Jeneal Danita Macintosh, MD   30 mg at 11/20/23 0945    SURGICAL HISTORY:  Past Surgical History:  Procedure Laterality Date   ABDOMINAL HYSTERECTOMY      BOWEL RESECTION N/A 05/23/2020   Procedure: SMALL BOWEL REPAIR;  Surgeon: Stevie Herlene Righter, MD;  Location: [facility] OR;  Service: General;  Laterality: N/A;   BREAST BIOPSY     BRONCHIAL BIOPSY  06/10/2020   Procedure: BRONCHIAL BIOPSIES;  Surgeon: Brenna Adine CROME, DO;  Location: [facility];  Service: Pulmonary;;   BRONCHIAL BRUSHINGS  06/10/2020   Procedure: BRONCHIAL BRUSHINGS;  Surgeon: Brenna Adine CROME, DO;  Location: [facility];  Service: Pulmonary;;   BRONCHIAL NEEDLE ASPIRATION BIOPSY  06/10/2020   Procedure: BRONCHIAL NEEDLE ASPIRATION BIOPSIES;  Surgeon: Brenna Adine CROME, DO;  Location: [facility];  Service: Pulmonary;;   BRONCHIAL WASHINGS  06/10/2020   Procedure: BRONCHIAL WASHINGS;  Surgeon: Brenna Adine CROME, DO;  Location: [facility];  Service: Pulmonary;;   COLON SURGERY     DIAGNOSTIC LARYNGOSCOPY N/A 05/23/2020   Procedure: ATTEMPTED DIAGNOSTIC LAPAROSCOPY WITH ADHESIONS;  Surgeon: Stevie Herlene Righter, MD;  Location: [facility] OR;  Service: General;  Laterality: N/A;   EYE SURGERY     IR IMAGING GUIDED PORT INSERTION  07/15/2020   IR REMOVAL TUN ACCESS W/ PORT W/O FL MOD SED  01/16/2023   KNEE SURGERY     LAPAROTOMY N/A 04/18/2015   Procedure: Exploratory laparotomy with lysis of adhesions, possible bowel resection;  Surgeon: Lynda Leos, MD;  Location: [facility] OR;  Service: General;  Laterality: N/A;   LAPAROTOMY N/A 05/23/2020   Procedure: EXPLORATORY LAPAROTOMY;  Surgeon: Stevie Herlene Righter, MD;  Location: [facility] OR;  Service: General;  Laterality: N/A;   LYSIS OF ADHESION N/A 05/23/2020   Procedure: LYSIS OF ADHESION;  Surgeon: Stevie Herlene Righter, MD;  Location: [facility] OR;  Service: General;  Laterality: N/A;   NASAL SINUS SURGERY     SMALL INTESTINE SURGERY     THORACENTESIS Left 12/03/2022   Procedure: THORACENTESIS;  Surgeon: Brenna Adine CROME, DO;  Location: [facility];  Service: Pulmonary;  Laterality: Left;   THYROID  SURGERY     VIDEO BRONCHOSCOPY WITH  ENDOBRONCHIAL NAVIGATION Bilateral 06/10/2020   Procedure: VIDEO BRONCHOSCOPY WITH ENDOBRONCHIAL NAVIGATION;  Surgeon: Brenna Adine CROME, DO;  Location: [facility];  Service: Pulmonary;  Laterality: Bilateral;   VIDEO BRONCHOSCOPY WITH ENDOBRONCHIAL ULTRASOUND  06/10/2020   Procedure: VIDEO BRONCHOSCOPY WITH ENDOBRONCHIAL ULTRASOUND;  Surgeon: Brenna Adine CROME, DO;  Location: [facility];  Service: Pulmonary;;    REVIEW OF SYSTEMS:  A comprehensive review of systems was negative.   PHYSICAL EXAMINATION: General appearance: alert, cooperative, and no distress Head: Normocephalic, without obvious abnormality, atraumatic Neck: no adenopathy, no JVD, supple, symmetrical, trachea midline, and thyroid  not enlarged, symmetric, no tenderness/mass/nodules Lymph nodes: Cervical, supraclavicular, and axillary nodes normal. Resp: clear to auscultation bilaterally Back: symmetric, no curvature. ROM normal. No CVA tenderness. Cardio: regular rate and rhythm, S1, S2 normal, no murmur, click, rub or gallop GI: soft, non-tender; bowel sounds normal; no masses,  no organomegaly Extremities: extremities normal, atraumatic, no cyanosis or edema  ECOG PERFORMANCE STATUS: 1 - Symptomatic but completely ambulatory  Blood pressure (!) 137/58, pulse 77, temperature 97.7 F (36.5 C), temperature source Temporal, resp. rate 16, height 5' 3.78 (1.62 m), weight 130 lb (59 kg), SpO2 100%.  LABORATORY DATA: Lab Results  Component Value Date   WBC 4.7 12/27/2023   HGB 13.6 12/27/2023   HCT 39.7 12/27/2023   MCV 89.2 12/27/2023   PLT 197 12/27/2023      Chemistry      Component Value Date/Time   NA 138 12/27/2023 0757   K 4.1 12/27/2023 0757   CL 104 12/27/2023 0757   CO2 29 12/27/2023 0757   BUN 14 12/27/2023 0757   CREATININE 0.96 12/27/2023 0757   CREATININE 0.86 11/02/2022 1604      Component Value Date/Time   CALCIUM  9.4 12/27/2023 0757   ALKPHOS 72 12/27/2023 0757   AST 24 12/27/2023 0757    ALT 15 12/27/2023 0757   BILITOT 0.7 12/27/2023 0757       RADIOGRAPHIC STUDIES: CT Chest W Contrast Result Date: 01/02/2024 CLINICAL DATA:  Non-small-cell lung cancer restaging * Tracking Code: BO * EXAM: CT CHEST WITH CONTRAST TECHNIQUE: Multidetector CT imaging of the chest was performed during intravenous contrast administration. RADIATION DOSE REDUCTION: This exam was performed according to the departmental dose-optimization program which includes automated exposure control, adjustment of the mA and/or kV according to patient size and/or use of iterative reconstruction technique. CONTRAST:  80mL ISOVUE -300 IOPAMIDOL  (ISOVUE -300) INJECTION 61% COMPARISON:  06/20/2023 FINDINGS: Cardiovascular: Aortic  atherosclerosis. Normal heart size. Three-vessel coronary artery calcifications. No pericardial effusion. Mediastinum/Nodes: No enlarged mediastinal, hilar, or axillary lymph nodes. Thyroid  gland, trachea, and esophagus demonstrate no significant findings. Lungs/Pleura: Unchanged post treatment appearance of the infrahilar left lower lobe, with fibrotic consolidation and bronchiectasis (series 8, image 71). Interval resolution of a previously seen small left pleural effusion. Unchanged background of fine centrilobular nodularity throughout the lungs, with other scattered tiny irregular nodules, unchanged and benign. Upper Abdomen: No acute abnormality. Musculoskeletal: Skin thickening of the left breast. No acute osseous findings. Disc degenerative disease and bridging osteophytosis throughout the thoracic spine. IMPRESSION: 1. Unchanged post treatment appearance of the infrahilar left lower lobe, with fibrotic consolidation and bronchiectasis. 2. No evidence of recurrent or metastatic disease in the chest. 3. Unchanged background of smoking-related respiratory bronchiolitis as well as small benign infectious or inflammatory nodules. 4. Coronary artery disease. 5. Skin thickening of the left breast. Aortic  Atherosclerosis (ICD10-I70.0). Electronically Signed   By: Marolyn JONETTA Jaksch M.D.   On: 01/02/2024 14:28     ASSESSMENT AND PLAN: This is a very pleasant 83 years old African-American female recently diagnosed with a stage IIIb (T2a, N3, M0) non-small cell lung cancer, adenocarcinoma presented with left lower lobe lung mass in addition to left hilar and mediastinal lymphadenopathy as well as right and left cervical lymphadenopathy diagnosed in January 2022. The patient underwent a course of concurrent chemoradiation with weekly carboplatin  and paclitaxel  status post 5 cycles with partial response.   The patient underwent consolidation treatment with Imfinzi  1500 Mg IV every 4 weeks status post 3 cycles.  She has been tolerating this treatment well but during the last infusion she developed systemic inflammatory response syndrome with fever and blood work showed bacteremia secondary to Klebsiella pneumonia treated with antibiotic during her hospitalization.  She is currently on observation. The patient has been doing fine with no concerning complaints. She had repeat CT scan of the chest performed recently.  I personally independently reviewed the scan and discussed the results with the patient and her daughter.  Her scan showed no concerning findings for disease progression. Assessment and Plan Assessment & Plan Stage IIIb non-small cell lung cancer, adenocarcinoma on observation Stage IIIb non-small cell lung cancer, adenocarcinoma, initially diagnosed in January 2022. Status post concurrent chemoradiation with weekly carboplatin  and paclitaxel , followed by three cycles of consolidation immunotherapy with Imfinzi , which was discontinued due to intolerance. Currently on observation since July 2022. Recent CT scan of the chest shows no progression of disease, indicating well-managed condition. No new symptoms reported, including no chest pain, weight loss, night sweats, or headaches. Breathing assessed as  good by her allergy and asthma doctor. - Continue observation with six-month follow-up visits and CT scans until five years post-diagnosis. She was advised to call immediately if she has any other concerning symptoms in the interval.  The patient voices understanding of current disease status and treatment options and is in agreement with the current care plan.  All questions were answered. The patient knows to call the clinic with any problems, questions or concerns. We can certainly see the patient much sooner if necessary.  Disclaimer: This note was dictated with voice recognition software. Similar sounding words can inadvertently be transcribed and may not be corrected upon review.

## 2024-01-11 ENCOUNTER — Other Ambulatory Visit: Payer: Self-pay | Admitting: Allergy

## 2024-01-13 ENCOUNTER — Encounter: Payer: Self-pay | Admitting: Allergy

## 2024-01-15 ENCOUNTER — Ambulatory Visit (INDEPENDENT_AMBULATORY_CARE_PROVIDER_SITE_OTHER)

## 2024-01-15 DIAGNOSIS — J4550 Severe persistent asthma, uncomplicated: Secondary | ICD-10-CM | POA: Diagnosis not present

## 2024-01-29 ENCOUNTER — Encounter: Payer: Self-pay | Admitting: Pharmacist

## 2024-01-29 NOTE — Progress Notes (Signed)
 Pharmacy Quality Measure Review  This patient is appearing on a report for being at risk of failing the adherence measure for cholesterol (statin) medications this calendar year.   Medication: Pravastatin  80 mg Last fill date: 01/10/24 for 90 day supply  Insurance report was not up to date. No action needed at this time.   Darrelyn Drum, PharmD, BCPS, CPP Clinical Pharmacist Practitioner [facility] [phone]

## 2024-03-10 ENCOUNTER — Ambulatory Visit: Admitting: Podiatry

## 2024-03-10 ENCOUNTER — Other Ambulatory Visit: Payer: Self-pay | Admitting: Internal Medicine

## 2024-03-10 ENCOUNTER — Other Ambulatory Visit: Payer: Self-pay

## 2024-03-10 DIAGNOSIS — I739 Peripheral vascular disease, unspecified: Secondary | ICD-10-CM

## 2024-03-10 DIAGNOSIS — B351 Tinea unguium: Secondary | ICD-10-CM | POA: Diagnosis not present

## 2024-03-10 DIAGNOSIS — L84 Corns and callosities: Secondary | ICD-10-CM

## 2024-03-10 DIAGNOSIS — M79674 Pain in right toe(s): Secondary | ICD-10-CM | POA: Diagnosis not present

## 2024-03-10 DIAGNOSIS — M79675 Pain in left toe(s): Secondary | ICD-10-CM

## 2024-03-10 NOTE — Progress Notes (Signed)
       Subjective:  Patient ID: Angelica [name] Lot, female    DOB: 01/17/1941,  MRN: [id number]  Angelica [name] presents to clinic today for:  Chief Complaint  Patient presents with   [facility]     RFC Non diabetic toenail trim. A1C 5.8.   Patient notes nails are thick and elongated, causing pain in shoe gear when ambulating.  She has a painful corn on the left fifth toe.  Her daughter is with her today.    PCP is Norleen Lynwood ORN, MD. last seen on 12/17/2023  Past Medical History:  Diagnosis Date   Allergy 05/21/1978   Arthritis    Asthma    Bowel obstruction (HCC)    Cancer (HCC)    Cataract    Environmental allergies    GERD (gastroesophageal reflux disease)    Glaucoma 05/09/2017   H. pylori infection    History of radiation therapy 07/05/20-08/05/20   Left lung IMRT Dr. Shannon    HLD (hyperlipidemia) 01/16/2019   Hypertension    Iron  deficiency anemia    Osteopenia 05/26/2017   Thyroid  disease    Urticaria 07/25/2018   Allergies  Allergen Reactions   Asa Buff (Mag [Buffered Aspirin] Other (See Comments)    Excessive sweating   Ensure [Nutritional Supplements]     Or boost - upset stomach    Fish Allergy Other (See Comments)    Unknown reaction, but allergic   Other Other (See Comments)    Gelcaps; gel-containing capsules; extended-release medication - upset stomach    Peanut-Containing Drug Products Other (See Comments)    From allergy test   Penicillins Itching   Shellfish Protein-Containing Drug Products Other (See Comments)    From allergy test   Strawberry Extract Nausea And Vomiting and Other (See Comments)    From allergy test    Objective:  Angelica [name] is a pleasant 83 y.o. female in NAD. AAO x 3.  Vascular Examination: Patient has palpable DP pulse, absent PT pulse bilateral.  Delayed capillary refill bilateral toes.  Sparse digital hair bilateral.  Proximal to distal cooling WNL bilateral.    Dermatological Examination: Interspaces are clear with no  open lesions noted bilateral.  Skin is shiny and atrophic bilateral.  Nails are 3-9mm thick, with yellowish/brown discoloration, subungual debris and distal onycholysis x10.  There is pain with compression of nails x10.  There are hyperkeratotic lesions noted on the dorsal lateral left fifth toe PIPJ.  Patient qualifies for at-risk foot care because of PVD.  Assessment/Plan: 1. Pain due to onychomycosis of toenails of both feet   2. Corns   3. PVD (peripheral vascular disease)     Mycotic nails x10 were sharply debrided with sterile nail nippers and power debriding burr to decrease bulk and length.  Hyperkeratotic lesion on the left fifth toe PIPJ was shaved with #312 blade.  Return in about 3 months (around 06/10/2024) for RFC.   Angelica [name], DPM, FACFAS [facility]     [address] [place], [place] [address]                Office [phone]  Fax [phone]

## 2024-03-11 ENCOUNTER — Ambulatory Visit: Admitting: *Deleted

## 2024-03-11 DIAGNOSIS — J4550 Severe persistent asthma, uncomplicated: Secondary | ICD-10-CM

## 2024-03-28 ENCOUNTER — Other Ambulatory Visit: Payer: Self-pay | Admitting: Internal Medicine

## 2024-03-29 ENCOUNTER — Emergency Department (HOSPITAL_COMMUNITY)

## 2024-03-29 ENCOUNTER — Encounter (HOSPITAL_COMMUNITY): Payer: Self-pay | Admitting: Family Medicine

## 2024-03-29 ENCOUNTER — Other Ambulatory Visit: Payer: Self-pay

## 2024-03-29 ENCOUNTER — Inpatient Hospital Stay (HOSPITAL_COMMUNITY)
Admission: EM | Admit: 2024-03-29 | Discharge: 2024-04-23 | DRG: 853 | Disposition: A | Discharge status: Skilled Nursing Facility | Attending: Internal Medicine | Admitting: Internal Medicine

## 2024-03-29 DIAGNOSIS — Z923 Personal history of irradiation: Secondary | ICD-10-CM

## 2024-03-29 DIAGNOSIS — Z515 Encounter for palliative care: Secondary | ICD-10-CM | POA: Diagnosis not present

## 2024-03-29 DIAGNOSIS — I700 Atherosclerosis of aorta: Secondary | ICD-10-CM | POA: Diagnosis present

## 2024-03-29 DIAGNOSIS — E440 Moderate protein-calorie malnutrition: Secondary | ICD-10-CM | POA: Diagnosis present

## 2024-03-29 DIAGNOSIS — I2510 Atherosclerotic heart disease of native coronary artery without angina pectoris: Secondary | ICD-10-CM | POA: Diagnosis present

## 2024-03-29 DIAGNOSIS — K219 Gastro-esophageal reflux disease without esophagitis: Secondary | ICD-10-CM | POA: Diagnosis present

## 2024-03-29 DIAGNOSIS — I2489 Other forms of acute ischemic heart disease: Secondary | ICD-10-CM | POA: Diagnosis present

## 2024-03-29 DIAGNOSIS — J9601 Acute respiratory failure with hypoxia: Secondary | ICD-10-CM | POA: Diagnosis not present

## 2024-03-29 DIAGNOSIS — J90 Pleural effusion, not elsewhere classified: Secondary | ICD-10-CM | POA: Diagnosis present

## 2024-03-29 DIAGNOSIS — Z87891 Personal history of nicotine dependence: Secondary | ICD-10-CM

## 2024-03-29 DIAGNOSIS — C3492 Malignant neoplasm of unspecified part of left bronchus or lung: Secondary | ICD-10-CM | POA: Diagnosis present

## 2024-03-29 DIAGNOSIS — R5381 Other malaise: Secondary | ICD-10-CM

## 2024-03-29 DIAGNOSIS — R531 Weakness: Secondary | ICD-10-CM | POA: Diagnosis not present

## 2024-03-29 DIAGNOSIS — E878 Other disorders of electrolyte and fluid balance, not elsewhere classified: Secondary | ICD-10-CM | POA: Insufficient documentation

## 2024-03-29 DIAGNOSIS — Z83511 Family history of glaucoma: Secondary | ICD-10-CM

## 2024-03-29 DIAGNOSIS — M8580 Other specified disorders of bone density and structure, unspecified site: Secondary | ICD-10-CM | POA: Diagnosis present

## 2024-03-29 DIAGNOSIS — E039 Hypothyroidism, unspecified: Secondary | ICD-10-CM | POA: Diagnosis present

## 2024-03-29 DIAGNOSIS — R188 Other ascites: Secondary | ICD-10-CM | POA: Diagnosis present

## 2024-03-29 DIAGNOSIS — L89152 Pressure ulcer of sacral region, stage 2: Secondary | ICD-10-CM | POA: Diagnosis not present

## 2024-03-29 DIAGNOSIS — K56609 Unspecified intestinal obstruction, unspecified as to partial versus complete obstruction: Secondary | ICD-10-CM | POA: Diagnosis not present

## 2024-03-29 DIAGNOSIS — R1084 Generalized abdominal pain: Secondary | ICD-10-CM | POA: Diagnosis not present

## 2024-03-29 DIAGNOSIS — R338 Other retention of urine: Secondary | ICD-10-CM | POA: Insufficient documentation

## 2024-03-29 DIAGNOSIS — I10 Essential (primary) hypertension: Secondary | ICD-10-CM | POA: Diagnosis present

## 2024-03-29 DIAGNOSIS — K651 Peritoneal abscess: Secondary | ICD-10-CM | POA: Diagnosis not present

## 2024-03-29 DIAGNOSIS — I82621 Acute embolism and thrombosis of deep veins of right upper extremity: Secondary | ICD-10-CM | POA: Diagnosis present

## 2024-03-29 DIAGNOSIS — Z90710 Acquired absence of both cervix and uterus: Secondary | ICD-10-CM

## 2024-03-29 DIAGNOSIS — J9811 Atelectasis: Secondary | ICD-10-CM | POA: Diagnosis present

## 2024-03-29 DIAGNOSIS — K565 Intestinal adhesions [bands], unspecified as to partial versus complete obstruction: Secondary | ICD-10-CM | POA: Diagnosis present

## 2024-03-29 DIAGNOSIS — E8721 Acute metabolic acidosis: Secondary | ICD-10-CM | POA: Diagnosis not present

## 2024-03-29 DIAGNOSIS — R64 Cachexia: Secondary | ICD-10-CM | POA: Diagnosis present

## 2024-03-29 DIAGNOSIS — Z7902 Long term (current) use of antithrombotics/antiplatelets: Secondary | ICD-10-CM | POA: Diagnosis not present

## 2024-03-29 DIAGNOSIS — A419 Sepsis, unspecified organism: Secondary | ICD-10-CM | POA: Diagnosis present

## 2024-03-29 DIAGNOSIS — K623 Rectal prolapse: Secondary | ICD-10-CM | POA: Diagnosis present

## 2024-03-29 DIAGNOSIS — R001 Bradycardia, unspecified: Secondary | ICD-10-CM | POA: Insufficient documentation

## 2024-03-29 DIAGNOSIS — D72829 Elevated white blood cell count, unspecified: Secondary | ICD-10-CM

## 2024-03-29 DIAGNOSIS — Z8673 Personal history of transient ischemic attack (TIA), and cerebral infarction without residual deficits: Secondary | ICD-10-CM

## 2024-03-29 DIAGNOSIS — K5900 Constipation, unspecified: Secondary | ICD-10-CM | POA: Diagnosis present

## 2024-03-29 DIAGNOSIS — Z9221 Personal history of antineoplastic chemotherapy: Secondary | ICD-10-CM

## 2024-03-29 DIAGNOSIS — Z91013 Allergy to seafood: Secondary | ICD-10-CM

## 2024-03-29 DIAGNOSIS — R7989 Other specified abnormal findings of blood chemistry: Secondary | ICD-10-CM

## 2024-03-29 DIAGNOSIS — Z7189 Other specified counseling: Secondary | ICD-10-CM | POA: Diagnosis not present

## 2024-03-29 DIAGNOSIS — I3139 Other pericardial effusion (noninflammatory): Secondary | ICD-10-CM | POA: Diagnosis present

## 2024-03-29 DIAGNOSIS — E876 Hypokalemia: Secondary | ICD-10-CM | POA: Diagnosis present

## 2024-03-29 DIAGNOSIS — J910 Malignant pleural effusion: Secondary | ICD-10-CM | POA: Diagnosis not present

## 2024-03-29 DIAGNOSIS — D75839 Thrombocytosis, unspecified: Secondary | ICD-10-CM | POA: Diagnosis present

## 2024-03-29 DIAGNOSIS — J4550 Severe persistent asthma, uncomplicated: Secondary | ICD-10-CM | POA: Diagnosis present

## 2024-03-29 DIAGNOSIS — Z555 Less than a high school diploma: Secondary | ICD-10-CM

## 2024-03-29 DIAGNOSIS — G309 Alzheimer's disease, unspecified: Secondary | ICD-10-CM | POA: Diagnosis present

## 2024-03-29 DIAGNOSIS — Z91010 Allergy to peanuts: Secondary | ICD-10-CM

## 2024-03-29 DIAGNOSIS — R627 Adult failure to thrive: Secondary | ICD-10-CM | POA: Diagnosis present

## 2024-03-29 DIAGNOSIS — R6520 Severe sepsis without septic shock: Secondary | ICD-10-CM | POA: Diagnosis present

## 2024-03-29 DIAGNOSIS — Z91018 Allergy to other foods: Secondary | ICD-10-CM

## 2024-03-29 DIAGNOSIS — N179 Acute kidney failure, unspecified: Secondary | ICD-10-CM | POA: Diagnosis not present

## 2024-03-29 DIAGNOSIS — Z880 Allergy status to penicillin: Secondary | ICD-10-CM

## 2024-03-29 DIAGNOSIS — Z8249 Family history of ischemic heart disease and other diseases of the circulatory system: Secondary | ICD-10-CM

## 2024-03-29 DIAGNOSIS — F0280 Dementia in other diseases classified elsewhere without behavioral disturbance: Secondary | ICD-10-CM | POA: Diagnosis present

## 2024-03-29 DIAGNOSIS — Z833 Family history of diabetes mellitus: Secondary | ICD-10-CM

## 2024-03-29 DIAGNOSIS — R7303 Prediabetes: Secondary | ICD-10-CM | POA: Diagnosis present

## 2024-03-29 DIAGNOSIS — Z6826 Body mass index (BMI) 26.0-26.9, adult: Secondary | ICD-10-CM

## 2024-03-29 DIAGNOSIS — Z886 Allergy status to analgesic agent status: Secondary | ICD-10-CM

## 2024-03-29 DIAGNOSIS — J189 Pneumonia, unspecified organism: Secondary | ICD-10-CM | POA: Diagnosis present

## 2024-03-29 DIAGNOSIS — R609 Edema, unspecified: Secondary | ICD-10-CM | POA: Diagnosis not present

## 2024-03-29 DIAGNOSIS — J918 Pleural effusion in other conditions classified elsewhere: Secondary | ICD-10-CM | POA: Diagnosis not present

## 2024-03-29 DIAGNOSIS — E785 Hyperlipidemia, unspecified: Secondary | ICD-10-CM | POA: Diagnosis present

## 2024-03-29 DIAGNOSIS — Z79890 Hormone replacement therapy (postmenopausal): Secondary | ICD-10-CM

## 2024-03-29 DIAGNOSIS — Z7951 Long term (current) use of inhaled steroids: Secondary | ICD-10-CM

## 2024-03-29 LAB — CBG MONITORING, ED: Glucose-Capillary: 110 mg/dL — ABNORMAL HIGH (ref 70–99)

## 2024-03-29 LAB — CBC
HCT: 37.6 % (ref 36.0–46.0)
Hemoglobin: 12.6 g/dL (ref 12.0–15.0)
MCH: 30.4 pg (ref 26.0–34.0)
MCHC: 33.5 g/dL (ref 30.0–36.0)
MCV: 90.8 fL (ref 80.0–100.0)
Platelets: 418 K/uL — ABNORMAL HIGH (ref 150–400)
RBC: 4.14 MIL/uL (ref 3.87–5.11)
RDW: 13.1 % (ref 11.5–15.5)
WBC: 21.8 K/uL — ABNORMAL HIGH (ref 4.0–10.5)
nRBC: 0 % (ref 0.0–0.2)

## 2024-03-29 LAB — URINALYSIS, ROUTINE W REFLEX MICROSCOPIC
Bilirubin Urine: NEGATIVE
Glucose, UA: NEGATIVE mg/dL
Hgb urine dipstick: NEGATIVE
Ketones, ur: 5 mg/dL — AB
Leukocytes,Ua: NEGATIVE
Nitrite: NEGATIVE
Protein, ur: NEGATIVE mg/dL
Specific Gravity, Urine: >1.046 — ABNORMAL HIGH (ref 1.005–1.030)
pH: 6 (ref 5.0–8.0)

## 2024-03-29 LAB — COMPREHENSIVE METABOLIC PANEL WITH GFR
ALT: 10 U/L (ref 0–44)
AST: 19 U/L (ref 15–41)
Albumin: 3.2 g/dL — ABNORMAL LOW (ref 3.5–5.0)
Alkaline Phosphatase: 77 U/L (ref 38–126)
Anion gap: 12 (ref 5–15)
BUN: 11 mg/dL (ref 8–23)
CO2: 24 mmol/L (ref 22–32)
Calcium: 9 mg/dL (ref 8.9–10.3)
Chloride: 104 mmol/L (ref 98–111)
Creatinine, Ser: 0.77 mg/dL (ref 0.44–1.00)
GFR, Estimated: >60 mL/min (ref >60)
Glucose, Bld: 131 mg/dL — ABNORMAL HIGH (ref 70–99)
Potassium: 3.9 mmol/L (ref 3.5–5.1)
Sodium: 140 mmol/L (ref 135–145)
Total Bilirubin: 0.7 mg/dL (ref 0.0–1.2)
Total Protein: 7.4 g/dL (ref 6.5–8.1)

## 2024-03-29 LAB — PROTIME-INR
INR: 1.2 (ref 0.8–1.2)
Prothrombin Time: 16.1 s — ABNORMAL HIGH (ref 11.4–15.2)

## 2024-03-29 LAB — I-STAT CG4 LACTIC ACID, ED: Lactic Acid, Venous: 2.1 mmol/L (ref 0.5–1.9)

## 2024-03-29 LAB — TROPONIN T, HIGH SENSITIVITY
Troponin T High Sensitivity: 27 ng/L — ABNORMAL HIGH (ref 0–19)
Troponin T High Sensitivity: 28 ng/L — ABNORMAL HIGH (ref 0–19)

## 2024-03-29 LAB — PROCALCITONIN: Procalcitonin: 0.76 ng/mL

## 2024-03-29 LAB — VITAMIN B12: Vitamin B-12: 231 pg/mL (ref 180–914)

## 2024-03-29 LAB — MAGNESIUM: Magnesium: 2 mg/dL (ref 1.7–2.4)

## 2024-03-29 LAB — TSH: TSH: 4.09 u[IU]/mL (ref 0.350–4.500)

## 2024-03-29 LAB — T4, FREE: Free T4: 1.39 ng/dL — ABNORMAL HIGH (ref 0.61–1.12)

## 2024-03-29 MED ORDER — SODIUM CHLORIDE 0.9 % IV BOLUS
500.0000 mL | Freq: Once | INTRAVENOUS | Status: AC
  Administered 2024-03-29: 500 mL via INTRAVENOUS

## 2024-03-29 MED ORDER — IPRATROPIUM BROMIDE 0.06 % NA SOLN
2.0000 | Freq: Three times a day (TID) | NASAL | Status: DC
  Administered 2024-03-30 – 2024-04-23 (×62): 2 via NASAL
  Filled 2024-03-29 (×2): qty 15

## 2024-03-29 MED ORDER — ACETAMINOPHEN 325 MG PO TABS
650.0000 mg | ORAL_TABLET | Freq: Four times a day (QID) | ORAL | Status: DC | PRN
Start: 2024-03-29 — End: 2024-04-03
  Administered 2024-03-29 – 2024-03-31 (×5): 650 mg via ORAL
  Filled 2024-03-29 (×5): qty 2

## 2024-03-29 MED ORDER — FLUTICASONE FUROATE-VILANTEROL 100-25 MCG/ACT IN AEPB
1.0000 | INHALATION_SPRAY | Freq: Every day | RESPIRATORY_TRACT | Status: DC
  Administered 2024-03-30 – 2024-04-03 (×5): 1 via RESPIRATORY_TRACT
  Filled 2024-03-29 (×2): qty 28

## 2024-03-29 MED ORDER — SODIUM CHLORIDE 0.9 % IV SOLN
2.0000 g | Freq: Two times a day (BID) | INTRAVENOUS | Status: DC
  Administered 2024-03-30 – 2024-04-15 (×33): 2 g via INTRAVENOUS
  Filled 2024-03-29 (×32): qty 12.5

## 2024-03-29 MED ORDER — ONDANSETRON HCL 4 MG/2ML IJ SOLN
4.0000 mg | Freq: Four times a day (QID) | INTRAMUSCULAR | Status: DC | PRN
  Administered 2024-03-30 – 2024-04-13 (×8): 4 mg via INTRAVENOUS
  Filled 2024-03-29 (×8): qty 2

## 2024-03-29 MED ORDER — PANTOPRAZOLE SODIUM 40 MG PO TBEC
40.0000 mg | DELAYED_RELEASE_TABLET | Freq: Every day | ORAL | Status: DC
  Administered 2024-03-30: 40 mg via ORAL
  Filled 2024-03-29: qty 1

## 2024-03-29 MED ORDER — ONDANSETRON HCL 4 MG PO TABS: 4.0000 mg | ORAL_TABLET | Freq: Four times a day (QID) | ORAL | Status: DC | PRN

## 2024-03-29 MED ORDER — FLUTICASONE PROPIONATE 50 MCG/ACT NA SUSP
1.0000 | Freq: Every day | NASAL | Status: DC
  Administered 2024-03-30 – 2024-04-23 (×24): 1 via NASAL
  Filled 2024-03-29 (×2): qty 16

## 2024-03-29 MED ORDER — ALBUTEROL SULFATE HFA 108 (90 BASE) MCG/ACT IN AERS: 2.0000 | INHALATION_SPRAY | RESPIRATORY_TRACT | Status: DC | PRN

## 2024-03-29 MED ORDER — SODIUM CHLORIDE 0.9 % IV SOLN: INTRAVENOUS | Status: AC

## 2024-03-29 MED ORDER — VANCOMYCIN HCL 750 MG/150ML IV SOLN
750.0000 mg | INTRAVENOUS | Status: DC
  Administered 2024-03-30 – 2024-04-02 (×4): 750 mg via INTRAVENOUS
  Filled 2024-03-29 (×5): qty 150

## 2024-03-29 MED ORDER — SODIUM CHLORIDE 0.9 % IV SOLN
2.0000 g | Freq: Once | INTRAVENOUS | Status: AC
  Administered 2024-03-29: 2 g via INTRAVENOUS
  Filled 2024-03-29: qty 12.5

## 2024-03-29 MED ORDER — AMLODIPINE BESYLATE 10 MG PO TABS
10.0000 mg | ORAL_TABLET | Freq: Every day | ORAL | Status: DC
  Administered 2024-03-30 – 2024-03-31 (×2): 10 mg via ORAL
  Filled 2024-03-29 (×2): qty 1

## 2024-03-29 MED ORDER — ALBUTEROL SULFATE (2.5 MG/3ML) 0.083% IN NEBU: 2.5000 mg | INHALATION_SOLUTION | RESPIRATORY_TRACT | Status: DC | PRN

## 2024-03-29 MED ORDER — MEMANTINE HCL 5 MG PO TABS
10.0000 mg | ORAL_TABLET | Freq: Two times a day (BID) | ORAL | Status: DC
  Administered 2024-03-30 – 2024-03-31 (×4): 10 mg via ORAL
  Filled 2024-03-29 (×4): qty 1

## 2024-03-29 MED ORDER — PRAVASTATIN SODIUM 20 MG PO TABS
80.0000 mg | ORAL_TABLET | Freq: Every day | ORAL | Status: DC
  Administered 2024-03-30 – 2024-03-31 (×2): 80 mg via ORAL
  Filled 2024-03-29 (×2): qty 2

## 2024-03-29 MED ORDER — LEVOTHYROXINE SODIUM 50 MCG PO TABS
50.0000 ug | ORAL_TABLET | Freq: Every day | ORAL | Status: DC
  Administered 2024-03-30 – 2024-03-31 (×2): 50 ug via ORAL
  Filled 2024-03-29 (×2): qty 1

## 2024-03-29 MED ORDER — IOHEXOL 300 MG/ML  SOLN
100.0000 mL | Freq: Once | INTRAMUSCULAR | Status: AC | PRN
  Administered 2024-03-29: 100 mL via INTRAVENOUS

## 2024-03-29 MED ORDER — VANCOMYCIN HCL IN DEXTROSE 1-5 GM/200ML-% IV SOLN
1000.0000 mg | Freq: Once | INTRAVENOUS | Status: AC
  Administered 2024-03-30: 1000 mg via INTRAVENOUS
  Filled 2024-03-29: qty 200

## 2024-03-29 MED ORDER — VITAMIN B-12 1000 MCG PO TABS
500.0000 ug | ORAL_TABLET | Freq: Every day | ORAL | Status: DC
  Administered 2024-03-29 – 2024-03-31 (×2): 500 ug via ORAL
  Filled 2024-03-29: qty 5
  Filled 2024-03-29: qty 1
  Filled 2024-03-29: qty 5

## 2024-03-29 MED ORDER — ACETAMINOPHEN 650 MG RE SUPP: 650.0000 mg | Freq: Four times a day (QID) | RECTAL | Status: DC | PRN

## 2024-03-29 MED ORDER — DOCUSATE SODIUM 100 MG PO CAPS
100.0000 mg | ORAL_CAPSULE | Freq: Every day | ORAL | Status: DC
  Administered 2024-03-29 – 2024-03-31 (×3): 100 mg via ORAL
  Filled 2024-03-29 (×3): qty 1

## 2024-03-29 MED ORDER — LORATADINE 10 MG PO TABS
10.0000 mg | ORAL_TABLET | Freq: Every day | ORAL | Status: DC
  Administered 2024-03-30 – 2024-03-31 (×2): 10 mg via ORAL
  Filled 2024-03-29 (×2): qty 1

## 2024-03-29 MED ORDER — MONTELUKAST SODIUM 10 MG PO TABS
10.0000 mg | ORAL_TABLET | Freq: Every day | ORAL | Status: DC
  Administered 2024-03-30 (×2): 10 mg via ORAL
  Filled 2024-03-29 (×2): qty 1

## 2024-03-29 MED ORDER — CLOPIDOGREL BISULFATE 75 MG PO TABS
75.0000 mg | ORAL_TABLET | Freq: Every day | ORAL | Status: DC
  Administered 2024-03-30: 75 mg via ORAL
  Filled 2024-03-29: qty 1

## 2024-03-29 NOTE — Assessment & Plan Note (Addendum)
 Diagnosed in 05/2020 S/p concurrent chemoradiation with weekly carboplatin  for AUC of 2 and paclitaxel  45 mg/M2. S/P 5 X -2022 S/p Consolidation treatment with immunotherapy with Imfinzi  1500 mg IV q 4 wk x 3 cycles - discontinued 2/2 intolerance/sepsis Currently on observation Pleural fluid cytology was negative from 11/12

## 2024-03-29 NOTE — Assessment & Plan Note (Addendum)
 At baseline Continue namenda  Delirium precautions

## 2024-03-29 NOTE — ED Notes (Signed)
I attempted to collect blood cultures and was unsuccessful 

## 2024-03-29 NOTE — Assessment & Plan Note (Addendum)
 No signs of exacerbation  Continue bronchodilators

## 2024-03-29 NOTE — H&P (Signed)
 History and Physical    Patient: Angelica [name] FMW:[id number] DOB: Oct 08, 1940 DOA: 03/29/2024 DOS: the patient was seen and examined on 03/29/2024 PCP: Norleen Lynwood ORN, MD  Patient coming from: Home - lives with her daughter. Ambulates independently.    Chief Complaint: weakness   HPI: Angelica [name] is a 83 y.o. female with medical history significant of   GERD, HTN, HLD, IDA, asthma, stage 3b adenocarcinoma of the lung, hypothyroidism, hx of CVA, Alzheimer's, hx of SBO due to adhesions in 2016 and 2021 who presented to ED with complaints of weakness.  About 5 days she complained of her breast being heavy or swollen. Yesterday they went to [employer] and she could hardly walk, which was abnormal for her. This morning she had no energy to make her bed. She also was rubbing her left chest wall and saying it hurt. She also seemed to be breathing harder than normal. This prompted her daughter to call 911.  She denies any shortness of breath, but has a cough that started yesterday. It is productive with thick, white foam expectorant. Denies any hemoptysis. No sick contacts.   Denies any fever/chills, vision changes/headaches, chest pain or palpitations, shortness of breath, abdominal pain, N/V/D, dysuria or leg swelling.  She has been eating and drinking well. No weight loss.   She does not smoke or drink alcohol. She has dx of Alzheimer's. Daughter states she can bathe and dress on her own. She feeds herself. Can do all ADLs, but cook.   ER Course:  vitals: afebrile, bp: 125/75, HR: 91, RR: 18, oxygen: 98%RA Pertinent labs: wbc: 21.8, platelets 418, lactic acid: 2.1, troponin 27 CXR: Near-complete opacification of the left lung, differential includes large malignant pleural effusion, central hilar mass with obstruction, lobar atelectasis, and/or pneumonia. 2. Recommend contrast-enhanced chest CT for evaluation of recurrent tumor and/or malignant pleural effusion. CT head: no acute finding CT  abdomen/pelvis: Large highly located left pleural effusion with compressive atelectasis throughout the left lung. 2. Central low-attenuation in the left lower lobe may be due to radiation scarring. Recommend attention on follow-up. 3. Small pericardial effusion and trace right pleural effusion. 4. Moderate stool burden.  Rectal prolapse. 5. Aortic atherosclerosis (ICD10-I70.0). Coronary artery calcification. In ED: given cefepime  and vanc. TR[facility] asked to admit.    Review of Systems: As mentioned in the history of present illness. All other systems reviewed and are negative. Past Medical History:  Diagnosis Date   Allergy 05/21/1978   Arthritis    Asthma    Bowel obstruction (HCC)    Cancer (HCC)    Cataract    Environmental allergies    GERD (gastroesophageal reflux disease)    Glaucoma 05/09/2017   H. pylori infection    History of radiation therapy 07/05/20-08/05/20   Left lung IMRT Dr. Shannon    HLD (hyperlipidemia) 01/16/2019   Hypertension    Iron  deficiency anemia    Osteopenia 05/26/2017   Thyroid  disease    Urticaria 07/25/2018   Past Surgical History:  Procedure Laterality Date   ABDOMINAL HYSTERECTOMY     BOWEL RESECTION N/A 05/23/2020   Procedure: SMALL BOWEL REPAIR;  Surgeon: Stevie Herlene Righter, MD;  Location: [facility];  Service: General;  Laterality: N/A;   BREAST BIOPSY     BRONCHIAL BIOPSY  06/10/2020   Procedure: BRONCHIAL BIOPSIES;  Surgeon: Brenna Adine CROME, DO;  Location: [facility];  Service: Pulmonary;;   BRONCHIAL BRUSHINGS  06/10/2020   Procedure: BRONCHIAL BRUSHINGS;  Surgeon: Brenna Adine CROME,  DO;  Location: [facility];  Service: Pulmonary;;   BRONCHIAL NEEDLE ASPIRATION BIOPSY  06/10/2020   Procedure: BRONCHIAL NEEDLE ASPIRATION BIOPSIES;  Surgeon: Brenna Adine CROME, DO;  Location: [facility];  Service: Pulmonary;;   BRONCHIAL WASHINGS  06/10/2020   Procedure: BRONCHIAL WASHINGS;  Surgeon: Brenna Adine CROME, DO;  Location: [facility];   Service: Pulmonary;;   COLON SURGERY     DIAGNOSTIC LARYNGOSCOPY N/A 05/23/2020   Procedure: ATTEMPTED DIAGNOSTIC LAPAROSCOPY WITH ADHESIONS;  Surgeon: Stevie, Herlene Righter, MD;  Location: [facility];  Service: General;  Laterality: N/A;   EYE SURGERY     IR IMAGING GUIDED PORT INSERTION  07/15/2020   IR REMOVAL TUN ACCESS W/ PORT W/O FL MOD SED  01/16/2023   KNEE SURGERY     LAPAROTOMY N/A 04/18/2015   Procedure: Exploratory laparotomy with lysis of adhesions, possible bowel resection;  Surgeon: Lynda Leos, MD;  Location: [facility];  Service: General;  Laterality: N/A;   LAPAROTOMY N/A 05/23/2020   Procedure: EXPLORATORY LAPAROTOMY;  Surgeon: Stevie Herlene Righter, MD;  Location: [facility];  Service: General;  Laterality: N/A;   LYSIS OF ADHESION N/A 05/23/2020   Procedure: LYSIS OF ADHESION;  Surgeon: Stevie Herlene Righter, MD;  Location: [facility];  Service: General;  Laterality: N/A;   NASAL SINUS SURGERY     SMALL INTESTINE SURGERY     THORACENTESIS Left 12/03/2022   Procedure: THORACENTESIS;  Surgeon: Brenna Adine CROME, DO;  Location: [facility];  Service: Pulmonary;  Laterality: Left;   THYROID  SURGERY     VIDEO BRONCHOSCOPY WITH ENDOBRONCHIAL NAVIGATION Bilateral 06/10/2020   Procedure: VIDEO BRONCHOSCOPY WITH ENDOBRONCHIAL NAVIGATION;  Surgeon: Brenna Adine CROME, DO;  Location: [facility];  Service: Pulmonary;  Laterality: Bilateral;   VIDEO BRONCHOSCOPY WITH ENDOBRONCHIAL ULTRASOUND  06/10/2020   Procedure: VIDEO BRONCHOSCOPY WITH ENDOBRONCHIAL ULTRASOUND;  Surgeon: Brenna Adine CROME, DO;  Location: [facility];  Service: Pulmonary;;   Social History:  reports that she quit smoking about 53 years ago. Her smoking use included cigarettes. She has been exposed to tobacco smoke. She has never used smokeless tobacco. She reports that she does not drink alcohol and does not use drugs.  Allergies  Allergen Reactions   Asa Buff (Mag [Buffered Aspirin] Other (See Comments)    Excessive sweating    Ensure [Nutritional Supplements]     Or boost - upset stomach    Fish Allergy Other (See Comments)    Unknown reaction, but allergic   Other Other (See Comments)    Gelcaps; gel-containing capsules; extended-release medication - upset stomach    Peanut-Containing Drug Products Other (See Comments)    From allergy test   Penicillins Itching   Shellfish Protein-Containing Drug Products Other (See Comments)    From allergy test   Strawberry Extract Nausea And Vomiting and Other (See Comments)    From allergy test     Family History  Problem Relation Age of Onset   Diabetes Mother    Hypertension Mother    Hypertension Father    Glaucoma Sister    Glaucoma Brother    Allergic rhinitis Neg Hx    Angioedema Neg Hx    Asthma Neg Hx    Eczema Neg Hx    Immunodeficiency Neg Hx    Urticaria Neg Hx    BRCA 1/2 Neg Hx    Breast cancer Neg Hx     Prior to Admission medications   Medication Sig Start Date End Date Taking? Authorizing Provider  acetaminophen  (TYLENOL ) 500  MG tablet Take 1,000 mg by mouth every 6 (six) hours as needed for moderate pain.    [provider]  albuterol  (VENTOLIN  HFA) 108 (90 Base) MCG/ACT inhaler Inhale 2 puffs into the lungs every 4 (four) hours as needed for wheezing or shortness of breath. 01/03/24   Jeneal Danita Macintosh, MD  alum & mag hydroxide-simeth (MAALOX/MYLANTA) 200-200-20 MG/5ML suspension Take 15 mLs by mouth every 6 (six) hours as needed for indigestion or heartburn.    [provider]  amLODipine  (NORVASC ) 10 MG tablet Take 1 tablet (10 mg total) by mouth daily. 06/17/23   Norleen Lynwood ORN, MD  benralizumab  (FASENRA ) 30 MG/ML prefilled syringe Inject 1 mL (30 mg total) into the skin every 8 (eight) weeks. 07/15/23   Jeneal Danita Macintosh, MD  cetirizine  [facility] ALLERGY RELIEF, CETIRIZINE ,) 10 MG tablet Take 1 tablet (10 mg total) by mouth daily. 01/03/24   Jeneal Danita Macintosh, MD  clopidogrel  (PLAVIX ) 75 MG tablet Take 1  tablet by mouth once daily 07/18/23   John, James W, MD  diclofenac  Sodium (VOLTAREN ) 1 % GEL Apply 4 g topically 4 (four) times daily. Patient taking differently: Apply 4 g topically 4 (four) times daily as needed (pain). 04/23/22   Corey, Evan S, MD  EPINEPHrine  (EPIPEN  2-PAK) 0.3 mg/0.3 mL IJ SOAJ injection Use as directed for severe allergic reaction 01/03/24   Jeneal Danita Macintosh, MD  fluticasone  (FLONASE ) 50 MCG/ACT nasal spray USE 1 SPRAY(S) IN EACH NOSTRIL IN THE MORNING AND AT BEDTIME 01/14/24   Jeneal Danita Macintosh, MD  gentamicin  ointment (GARAMYCIN ) 0.1 % Apply 1 Application topically daily. Apply to wound daily between toes.  Use a very small amount. Patient not taking: Reported on 03/10/2024 08/27/23   McCaughan, Dia D, DPM  ipratropium (ATROVENT ) 0.06 % nasal spray Place 2 sprays into both nostrils 3 (three) times daily as needed for rhinitis. 01/03/24   Jeneal Danita Macintosh, MD  levothyroxine  (SYNTHROID ) 50 MCG tablet TAKE 1 TABLET BY MOUTH ONCE DAILY BEFORE BREAKFAST 03/10/24   Norleen Lynwood ORN, MD  Magnesium  Hydroxide (PHILLIPS MILK OF MAGNESIA PO) Take 15-30 mLs by mouth daily as needed (for constipation).    [provider]  memantine  (NAMENDA ) 10 MG tablet Take 1 tablet (10 mg total) by mouth 2 (two) times daily. 10/30/23   Whitfield Raisin, NP  montelukast  (SINGULAIR ) 10 MG tablet Take 1 tablet (10 mg total) by mouth at bedtime. 01/03/24   Jeneal Danita Macintosh, MD  ondansetron  (ZOFRAN -ODT) 4 MG disintegrating tablet Take 1 tablet (4 mg total) by mouth every 8 (eight) hours as needed for nausea or vomiting. 07/09/22   Merlynn Niki FALCON, FNP  pantoprazole  (PROTONIX ) 40 MG tablet Take 1 tablet by mouth once daily 12/30/23   Norleen Lynwood ORN, MD  potassium chloride  SA (KLOR-CON  M) 20 MEQ tablet Take 2 tablets (40 mEq total) by mouth daily as needed. Take potasium days you take lasix . Patient not taking: Reported on 03/10/2024 01/03/23   Norleen Lynwood ORN, MD  pravastatin   (PRAVACHOL ) 80 MG tablet Take 1 tablet by mouth once daily 04/29/23   Norleen Lynwood ORN, MD  SYMBICORT  80-4.5 MCG/ACT inhaler INHALE 2 PUFFS BY MOUTH IN THE MORNING AND AT BEDTIME 01/03/24   Jeneal Danita Macintosh, MD  SYMBICORT  80-4.5 MCG/ACT inhaler INHALE 2 PUFFS BY MOUTH IN THE MORNING 01/03/24   Jeneal Danita Macintosh, MD    Physical Exam: Vitals:   03/29/24 1316 03/29/24 1538 03/29/24 1600 03/29/24 1813  BP: (!) 128/101 126/68  ROLLEN)  140/78  Pulse: 89 71  92  Resp: (!) 21 14  20   Temp: 98.1 F (36.7 C)   98.3 F (36.8 C)  TempSrc: Oral   Oral  SpO2: 99% 94%  97%  Weight:   59 kg    General:  Appears calm and comfortable and is in NAD Eyes:  PERRL, EOMI, normal lids, iris ENT:  grossly normal hearing, lips & tongue, mmm; appropriate dentition Neck:  no LAD, masses or thyromegaly; no carotid bruits Cardiovascular:  RRR, no m/r/g. No LE edema.  Respiratory:  no air movement in left mid to lower lobe. No wheezing/crackles. Normal effort.  Abdomen:  soft, NT, ND, NABS Back:   normal alignment, no CVAT Skin:  no rash or induration seen on limited exam Musculoskeletal:  grossly normal tone BUE/BLE, good ROM, no bony abnormality Lower extremity:  No LE edema.  Limited foot exam with no ulcerations.  2+ distal pulses. Psychiatric:  grossly normal mood and affect, speech fluent and appropriate, Aox1 (at baseline)  Neurologic:  CN 2-12 grossly intact, moves all extremities in coordinated fashion, sensation intact   Radiological Exams on Admission: Independently reviewed - see discussion in A/P where applicable  CT CHEST ABDOMEN PELVIS W CONTRAST Result Date: 03/29/2024 CLINICAL DATA:  Pleural effusion, malignancy suspected. Pneumonia. * Tracking Code: BO * EXAM: CT CHEST, ABDOMEN, AND PELVIS WITH CONTRAST TECHNIQUE: Multidetector CT imaging of the chest, abdomen and pelvis was performed following the standard protocol during bolus administration of intravenous contrast. RADIATION DOSE  REDUCTION: This exam was performed according to the departmental dose-optimization program which includes automated exposure control, adjustment of the mA and/or kV according to patient size and/or use of iterative reconstruction technique. CONTRAST:  OMNIPAQUE  IOHEXOL  300 MG/ML  SOLN COMPARISON:  CT chest 12/27/2023, CT abdomen pelvis 10/09/2023, PET 02/26/2023. FINDINGS: CT CHEST FINDINGS Cardiovascular: Atherosclerotic calcification of the aorta and coronary arteries. Heart size normal. No pericardial effusion. Left ventricle appears hypertrophied. Small pericardial effusion. Mediastinum/Nodes: No pathologically enlarged mediastinal, hilar or axillary lymph nodes. Thoracic inlet lymph nodes are not enlarged by CT size criteria. 10 mm prepericardiac or left juxtadiaphragmatic lymph node (6/54), new. Partial right thyroidectomy. Esophagus is grossly unremarkable. Lungs/Pleura: Large highly loculated left pleural effusion with compressive atelectasis in the left lung. There is central low-attenuation in the left lower lobe (6/48). Trace right pleural effusion. Airway is unremarkable. Musculoskeletal: Degenerative changes in the spine. No worrisome lytic or sclerotic lesions. Mild compression of the T6 superior endplate, old. CT ABDOMEN PELVIS FINDINGS Hepatobiliary: Liver and gallbladder are unremarkable. No biliary ductal dilatation. Pancreas: Negative. Spleen: Negative. Adrenals/Urinary Tract: Adrenal glands and right kidney are unremarkable. Small low-attenuation lesion in the left kidney, too small to characterize. No specific follow-up necessary. Ureters are decompressed. Bladder is grossly unremarkable. Stomach/Bowel: Stomach and small bowel are unremarkable. Appendix is not readily visualized. Moderate stool burden. Rectal prolapse. Vascular/Lymphatic: Atherosclerotic calcification of the aorta. No pathologically enlarged lymph nodes. Reproductive: Hysterectomy.  No adnexal mass. Other: No free fluid.  Musculoskeletal: Degenerative changes in the spine. No worrisome lytic or sclerotic lesions. Grade 1 anterolisthesis of L4 on L5. IMPRESSION: 1. Large highly located left pleural effusion with compressive atelectasis throughout the left lung. 2. Central low-attenuation in the left lower lobe may be due to radiation scarring. Recommend attention on follow-up. 3. Small pericardial effusion and trace right pleural effusion. 4. Moderate stool burden.  Rectal prolapse. 5. Aortic atherosclerosis (ICD10-I70.0). Coronary artery calcification. Electronically Signed   By: Newell Eke M.D.  On: 03/29/2024 15:15   CT Head Wo Contrast Result Date: 03/29/2024 EXAM: CT HEAD WITHOUT CONTRAST 03/29/2024 01:49:45 PM TECHNIQUE: CT of the head was performed without the administration of intravenous contrast. Automated exposure control, iterative reconstruction, and/or weight based adjustment of the mA/kV was utilized to reduce the radiation dose to as low as reasonably achievable. COMPARISON: CT head 07/24/2021 CLINICAL HISTORY: Mental status change, unknown cause FINDINGS: BRAIN AND VENTRICLES: No acute hemorrhage. No evidence of acute infarct. No hydrocephalus. No extra-axial collection. No mass effect or midline shift. ORBITS: No acute abnormality. SINUSES: Scattered paranasal sinus mucosal thickening and opacified right frontal sinus. SOFT TISSUES AND SKULL: No skull fracture. IMPRESSION: 1. No acute intracranial abnormality. Electronically signed by: Gilmore Molt MD 03/29/2024 02:07 PM EST RP Workstation: [id number]   DG Chest Port 1 View Result Date: 03/29/2024 EXAM: 1 VIEW(S) XRAY OF THE CHEST 03/29/2024 10:26:00 AM COMPARISON: CT chest from 12/27/2023. CLINICAL HISTORY: Weakness. History of lung cancer. FINDINGS: LUNGS AND PLEURA: There is near complete opacification of the left lung. These findings may reflect large malignant pleural effusion, central hilar mass, lobar atelectasis, and/or pneumonia. Right lung  appears clear. No pulmonary edema. No pneumothorax. Consider further evaluation with contrast-enhanced CT of the chest to assess for underlying recurrent tumor and/or malignant pleural effusion. HEART AND MEDIASTINUM: The cardiac and mediastinal silhouettes are obscured by the near complete opacification of the left lung. A central hilar mass is considered in the differential for the left lung opacification. BONES AND SOFT TISSUES: No acute osseous abnormality. IMPRESSION: 1. Near-complete opacification of the left lung, differential includes large malignant pleural effusion, central hilar mass with obstruction, lobar atelectasis, and/or pneumonia. 2. Recommend contrast-enhanced chest CT for evaluation of recurrent tumor and/or malignant pleural effusion. Electronically signed by: Waddell Calk MD 03/29/2024 10:50 AM EST RP Workstation: [id number]    EKG: Independently reviewed.  NSR with rate 92; nonspecific ST changes with no evidence of acute ischemia   Labs on Admission: I have personally reviewed the available labs and imaging studies at the time of the admission.  Pertinent labs:   wbc: 21.8,  platelets 418,  lactic acid: 2.1,  troponin 27  Assessment and Plan: Principal Problem:   Pleural effusion associated with pulmonary infection vs. recurrent malignancy and sepsis Active Problems:   Weakness   Adenocarcinoma of left lung, stage 3 (HCC)   HTN (hypertension)   Elevated troponin   HLD (hyperlipidemia)   History of stroke   Hypothyroidism   Asthma, severe persistent, well-controlled (HCC)   Alzheimer's disease (HCC)   GERD (gastroesophageal reflux disease)   Constipation   Sepsis (HCC)    Assessment and Plan: * Pleural effusion associated with pulmonary infection vs. recurrent malignancy and sepsis 83 year old with known history of stage 3b adenocarcinoma of the lung who presented to ED with complaints of weakness and left sided anterior chest pain with increased work of  breathing with productive cough found to have near complete opacification of the left lung and meeting sepsis criteria with leukocytosis of 21.8, tachycardia and mild lactic acidosis -admit to progressive  -concern for possible infection vs. Recurrent malignancy with obstruction  -not requiring any oxygen and breathing very comfortable on room air  -check PCT, trend lactic acid -give IVF bolus, continue fluids  -x1 BC only obtained  -sputum culture  -continue vanc/cefepime  -check urinary antigens  -trend CBC  -pulmonology has been consulted for thoracentesis with pleural fluid studies tomorrow   Weakness Likely secondary to #1 with acute  onset Check metabolic labs  Fall precautions PT to eval   Adenocarcinoma of left lung, stage 3 (HCC)  -diagnosed in 05/2020, current therapy is observation alone. Past treatments below -Concurrent chemoradiation with weekly carboplatin  for AUC of 2 and paclitaxel  45 mg/M2.  Status post 5 cycles.  Last dose was given August 01, 2020. -Consolidation treatment with immunotherapy with Imfinzi  1500 mg IV every 4 weeks.  First dose September 06, 2020.  Status post 3 cycles.  This treatment was discontinued secondary to intolerance and hospital admission with sepsis. -now with new left pleural effusion. Pulmonology consulted. Will need pleural fluid studies to r/o recurrent malignancy   Elevated troponin Flat and mildly elevated troponin 27>28 No chest pain or palpitations, no changes on EKG  Continue to monitor on tele   HTN (hypertension) Continue amlodipine  daily  PRN metoprolol with parameters  HLD (hyperlipidemia) Continue high intensity statin   History of stroke Allergy to ASA, continue plavix  and high intensity statin   Hypothyroidism Check TSH and free T4 with fatigue/weakness Continue synthroid  50mcg daily   Asthma, severe persistent, well-controlled (HCC) No signs of exacerbation   Alzheimer's disease (HCC) At baseline Continue  namenda  Delirium precautions   GERD (gastroesophageal reflux disease) Continue protonix  daily   Constipation -stool burden on CT  -start scheduled colace daily -encourage higher fiber diet  -may need more aggressive therapy     Advance Care Planning:   Code Status: Full Code    Consultants: Pulmonology and PT    Procedures: CT chest abdomen/pelvis with contrast 03/29/24    Antibiotics: Cefepime  and vancomycin  03/29/2024     DVT Prophylaxis: SCDs   Family Communication: daughter at bedside  Severity of Illness: The appropriate patient status for this patient is INPATIENT. Inpatient status is judged to be reasonable and necessary in order to provide the required intensity of service to ensure the patient's safety. The patient's presenting symptoms, physical exam findings, and initial radiographic and laboratory data in the context of their chronic comorbidities is felt to place them at high risk for further clinical deterioration. Furthermore, it is not anticipated that the patient will be medically stable for discharge from the hospital within 2 midnights of admission.   * I certify that at the point of admission it is my clinical judgment that the patient will require inpatient hospital care spanning beyond 2 midnights from the point of admission due to high intensity of service, high risk for further deterioration and high frequency of surveillance required.*  Author: Isaiah Geralds, MD 03/29/2024 8:22 PM  For on call review [email or web address].

## 2024-03-29 NOTE — ED Provider Notes (Signed)
  Physical Exam  BP (!) 128/101 (BP Location: Left Arm)   Pulse 89   Temp 98.1 F (36.7 C) (Oral)   Resp (!) 21   SpO2 99%   Physical Exam  Procedures  Procedures  ED Course / MDM    Medical Decision Making Care assumed at 3 pm.  Patient is here with cough and weakness.  X-ray showed large pleural effusion.  CT scan is pending at signout.  4:17 PM CT showed large left pleural effusion that is loculated.  Unclear if this is infection versus recurrent cancer.  Hospitalist to admit and will coordinate thoracentesis  Problems Addressed: Pleural effusion: acute illness or injury  Amount and/or Complexity of Data Reviewed Labs: ordered. Radiology: ordered.  Risk Prescription drug management.          Patt Alm Macho, MD 03/29/24 [phone]

## 2024-03-29 NOTE — Assessment & Plan Note (Addendum)
 Presenting with weakness, cough  PCCM following-infection versus malignancy Blood culture no growth so far On room air Continue broad-spectrum antibiotics w/ vancomycin  and cefepime   Chest tube placed 11/12, removed 11/15 Fluid studies suggest possible infectious etiology due to elevated WBC count, elevated LDH, elevated protein fluid but culture negative to date Blood cultures negative

## 2024-03-29 NOTE — ED Notes (Signed)
Unsuccessful attempt at IV x2

## 2024-03-29 NOTE — ED Provider Notes (Addendum)
 [facility] EMERGENCY DEPARTMENT AT [facility] Provider Note   CSN: [id number] Arrival date & time: 03/29/24  9164     Patient presents with: Weakness   Angelica [name] is a 83 y.o. female.   Patient states onset of generalized abdominal pain started this morning.  Not associated with any nausea vomiting or diarrhea.  Denies any fevers or dysuria.  However EMS stated the patient was sent in for onset of weakness.  Both the patient is been weak for 5 to 7 days.  Patient's family member she lives with her daughter was not present when I was in to see her.  Past medical history sniffing for hypertension asthma H. pylori infection gastroesophageal reflux disease osteopenia hyperlipidemia history of radiation treatment in 2022 left long.  Patient had exploratory laparotomy's in the past.  Most recent [facility] 2022.  Patient had a left thoracentesis December 03, 2022.  He is a former smoker quit 1972.       Prior to Admission medications   Medication Sig Start Date End Date Taking? Authorizing Provider  acetaminophen  (TYLENOL ) 500 MG tablet Take 1,000 mg by mouth every 6 (six) hours as needed for moderate pain.    [provider]  albuterol  (VENTOLIN  HFA) 108 (90 Base) MCG/ACT inhaler Inhale 2 puffs into the lungs every 4 (four) hours as needed for wheezing or shortness of breath. 01/03/24   Jeneal Danita Macintosh, MD  alum & mag hydroxide-simeth (MAALOX/MYLANTA) 200-200-20 MG/5ML suspension Take 15 mLs by mouth every 6 (six) hours as needed for indigestion or heartburn.    [provider]  amLODipine  (NORVASC ) 10 MG tablet Take 1 tablet (10 mg total) by mouth daily. 06/17/23   Norleen Lynwood ORN, MD  benralizumab  (FASENRA ) 30 MG/ML prefilled syringe Inject 1 mL (30 mg total) into the skin every 8 (eight) weeks. 07/15/23   Jeneal Danita Macintosh, MD  cetirizine  (EQ ALLERGY RELIEF, CETIRIZINE ,) 10 MG tablet Take 1 tablet (10 mg total) by mouth daily. 01/03/24   Jeneal Danita Macintosh, MD  clopidogrel  (PLAVIX ) 75 MG tablet Take 1 tablet by mouth once daily 07/18/23   John, James W, MD  diclofenac  Sodium (VOLTAREN ) 1 % GEL Apply 4 g topically 4 (four) times daily. Patient taking differently: Apply 4 g topically 4 (four) times daily as needed (pain). 04/23/22   Corey, Evan S, MD  EPINEPHrine  (EPIPEN  2-PAK) 0.3 mg/0.3 mL IJ SOAJ injection Use as directed for severe allergic reaction 01/03/24   Jeneal Danita Macintosh, MD  fluticasone  (FLONASE ) 50 MCG/ACT nasal spray USE 1 SPRAY(S) IN EACH NOSTRIL IN THE MORNING AND AT BEDTIME 01/14/24   Jeneal Danita Macintosh, MD  gentamicin  ointment (GARAMYCIN ) 0.1 % Apply 1 Application topically daily. Apply to wound daily between toes.  Use a very small amount. Patient not taking: Reported on 03/10/2024 08/27/23   McCaughan, Dia D, DPM  ipratropium (ATROVENT ) 0.06 % nasal spray Place 2 sprays into both nostrils 3 (three) times daily as needed for rhinitis. 01/03/24   Jeneal Danita Macintosh, MD  levothyroxine  (SYNTHROID ) 50 MCG tablet TAKE 1 TABLET BY MOUTH ONCE DAILY BEFORE BREAKFAST 03/10/24   Norleen Lynwood ORN, MD  Magnesium  Hydroxide (PHILLIPS MILK OF MAGNESIA PO) Take 15-30 mLs by mouth daily as needed (for constipation).    [provider]  memantine  (NAMENDA ) 10 MG tablet Take 1 tablet (10 mg total) by mouth 2 (two) times daily. 10/30/23   Whitfield Raisin, NP  montelukast  (SINGULAIR ) 10 MG tablet Take 1 tablet (10  mg total) by mouth at bedtime. 01/03/24   Jeneal Danita Macintosh, MD  ondansetron  (ZOFRAN -ODT) 4 MG disintegrating tablet Take 1 tablet (4 mg total) by mouth every 8 (eight) hours as needed for nausea or vomiting. 07/09/22   Merlynn Niki FALCON, FNP  pantoprazole  (PROTONIX ) 40 MG tablet Take 1 tablet by mouth once daily 12/30/23   Norleen Lynwood ORN, MD  potassium chloride  SA (KLOR-CON  M) 20 MEQ tablet Take 2 tablets (40 mEq total) by mouth daily as needed. Take potasium days you take lasix . Patient not taking:  Reported on 03/10/2024 01/03/23   Norleen Lynwood ORN, MD  pravastatin  (PRAVACHOL ) 80 MG tablet Take 1 tablet by mouth once daily 04/29/23   Norleen Lynwood ORN, MD  SYMBICORT  80-4.5 MCG/ACT inhaler INHALE 2 PUFFS BY MOUTH IN THE MORNING AND AT BEDTIME 01/03/24   Jeneal Danita Macintosh, MD  SYMBICORT  80-4.5 MCG/ACT inhaler INHALE 2 PUFFS BY MOUTH IN THE MORNING 01/03/24   Jeneal Danita Macintosh, MD    Allergies: Dorethia buff (mag [buffered aspirin], Ensure [nutritional supplements], Fish allergy, Other, Peanut-containing drug products, Penicillins, Shellfish protein-containing drug products, and Strawberry extract    Review of Systems  Constitutional:  Negative for chills and fever.  HENT:  Negative for ear pain and sore throat.   Eyes:  Negative for pain and visual disturbance.  Respiratory:  Negative for cough and shortness of breath.   Cardiovascular:  Negative for chest pain and palpitations.  Gastrointestinal:  Positive for abdominal pain. Negative for vomiting.  Genitourinary:  Negative for dysuria and hematuria.  Musculoskeletal:  Negative for arthralgias and back pain.  Skin:  Negative for color change and rash.  Neurological:  Positive for weakness. Negative for seizures and syncope.  All other systems reviewed and are negative.   Updated Vital Signs SpO2 94%   Physical Exam Vitals and nursing note reviewed.  Constitutional:      General: She is not in acute distress.    Appearance: Normal appearance. She is well-developed.  HENT:     Head: Normocephalic and atraumatic.     Mouth/Throat:     Mouth: Mucous membranes are dry.  Eyes:     Conjunctiva/sclera: Conjunctivae normal.  Cardiovascular:     Rate and Rhythm: Normal rate and regular rhythm.     Heart sounds: No murmur heard. Pulmonary:     Effort: Pulmonary effort is normal. No respiratory distress.     Breath sounds: Normal breath sounds.  Abdominal:     General: There is no distension.     Palpations: Abdomen is soft.      Tenderness: There is no abdominal tenderness. There is no guarding.  Musculoskeletal:        General: No swelling.     Cervical back: Neck supple.  Skin:    General: Skin is warm and dry.     Capillary Refill: Capillary refill takes less than 2 seconds.  Neurological:     General: No focal deficit present.     Mental Status: She is alert and oriented to person, place, and time.  Psychiatric:        Mood and Affect: Mood normal.     (all labs ordered are listed, but only abnormal results are displayed) Labs Reviewed  COMPREHENSIVE METABOLIC PANEL WITH GFR - Abnormal; Notable for the following components:      Result Value   Glucose, Bld 131 (*)    Albumin  3.2 (*)    All other components within normal limits  CBC -  Abnormal; Notable for the following components:   WBC 21.8 (*)    Platelets 418 (*)    All other components within normal limits  CBG MONITORING, ED - Abnormal; Notable for the following components:   Glucose-Capillary 110 (*)    All other components within normal limits  URINALYSIS, ROUTINE W REFLEX MICROSCOPIC    EKG: EKG Interpretation Date/Time:  Sunday March 29 2024 08:47:11 EST Ventricular Rate:  92 PR Interval:  128 QRS Duration:  86 QT Interval:  385 QTC Calculation: 477 R Axis:   -5  Text Interpretation: Sinus rhythm Probable left atrial enlargement Abnormal R-wave progression, early transition Borderline T wave abnormalities New since previous tracing Confirmed by Tykerria Mccubbins [phone]) on 03/29/2024 10:10:08 AM  Radiology: No results found.   Procedures   Medications Ordered in the ED - No data to display                                  Medical Decision Making Amount and/or Complexity of Data Reviewed Labs: ordered. Radiology: ordered.  Risk Prescription drug management.   Patient's blood sugar is 110.  Complete metabolic panel renal function is normal.  Complete metabolic panel liver function test are normal renal function is  normal.  CBC white count concerning with a white count of 21.8 hemoglobin 12.6 platelets 418.  Portable chest x-ray pending.  Will plan on CT abdomen.  CT head.  But will hold off because patient may need CT chest as well.  Patient also needs urinalysis.  And will get troponins.  Will add on blood cultures and will add on lactic acid.  Patient has been seen by oncology in the past I thought that was probably the case based on some of her past medical history and procedures that she has had done.  Patient last seen by oncology February 2025.  Patient known to have a stage IIIb non-small lung cancer adenocarcinoma presenting with left lower lobe mass in addition to left hilar and mediastinal as well as right and left cervical adenopathy diagnosed in January 2022.  Patient did undergo immunotherapy and chemotherapy patient has been considered cured at least based on that visit she had a CT chest scan done 2 weeks prior to that visit and there was no evidence of any recurrent disease.  Chest x-ray with near complete opacification of left lung differential includes large malignant pleural effusion central hilar mass with obstruction lobar atelectasis or pneumonia.  Based on this we will get CT chest with contrast and CT abdomen and pelvis with contrast.  Blood cultures were done troponins ordered.  Lactic acid done it was elevated at 2.1.  Vital signs not consistent with sepsis at this point in time.  But based on the chest x-ray certainly there could be a underlying pneumonia.  As well as possible underlying lung mass.  Initial troponin was 27 will need delta troponins.  CT head CT abdomen pelvis and CT chest pending.   Final diagnoses:  None    ED Discharge Orders     None          Odette Watanabe, MD 03/29/24 1047    Geraldene Hamilton, MD 03/29/24 1059    Geraldene Hamilton, MD 03/29/24 1248

## 2024-03-29 NOTE — ED Notes (Signed)
 I will collect patient vitals when she return from xray

## 2024-03-29 NOTE — Assessment & Plan Note (Addendum)
-  stool burden on CT  -start scheduled colace daily -encourage higher fiber diet  -may need more aggressive therapy

## 2024-03-29 NOTE — Assessment & Plan Note (Addendum)
 Allergy to ASA On plavix  and high intensity statin PTA Per neuro note October 30 2023- She had an MRI scan of the brain done on 07/18/2021 which I personally reviewed and shows only mild changes of small vessel disease and generalized atrophy. I do not see a definite right cerebellar stroke but the radiologist has called 1 which apparently was not seen on previous MRI from 06/14/2020. Patient has since been started on Plavix  by Dr. Norleen   Neuro Dr Rosemarie did not feel it was CVA Plavix  is currently on hold

## 2024-03-29 NOTE — Assessment & Plan Note (Addendum)
 Continue amlodipine   PRN metoprolol

## 2024-03-29 NOTE — Assessment & Plan Note (Addendum)
 Continue high intensity statin

## 2024-03-29 NOTE — ED Triage Notes (Signed)
 Patient arrives by EMS with new onset weakness.  Patient has chronic left breast pain from lymphedema but family noticed increase of weakness during the last two days. Patient has been weak from the last 5-7 days.

## 2024-03-29 NOTE — Assessment & Plan Note (Addendum)
 Likely secondary to above Fall precautions PT/OT recommending STR

## 2024-03-29 NOTE — ED Notes (Incomplete)
 Patient very difficult stick, multiple attempts and pulled lines out.  Ultrasound lines will not draw and all the labs we could obtain have been sent.  Charge RN aware.

## 2024-03-29 NOTE — ED Notes (Signed)
 Multiple attempts  by two staff members have been made to obtain second set of blood cultures.  Unable to obtain.

## 2024-03-29 NOTE — Assessment & Plan Note (Addendum)
 Flat and mildly elevated troponin 27>28 No chest pain or palpitations, no changes on EKG  Related to demand ischemia in the setting of sepsis

## 2024-03-29 NOTE — ED Notes (Signed)
 I made MD Zackowski aware of  critical I Stat Lactic

## 2024-03-29 NOTE — Assessment & Plan Note (Addendum)
Continue protonix daily. 

## 2024-03-29 NOTE — Assessment & Plan Note (Addendum)
 Normal TSH, mildly elevated free T4 Continue synthroid  50mcg daily

## 2024-03-29 NOTE — Progress Notes (Signed)
 Pharmacy Antibiotic Note  Angelica [name] is a 83 y.o. female admitted on 03/29/2024 with sepsis.  Pharmacy has been consulted for vanc/cefepime  dosing.  Plan: Vancomycin  1g x 1 then 750mg  IV q24 - goal AUC 400-550 Cefepime  2g IV q12  Weight: 59 kg (130 lb 1.1 oz)  Temp (24hrs), Avg:98.5 F (36.9 C), Min:98.1 F (36.7 C), Max:98.8 F (37.1 C)  Recent Labs  Lab 03/29/24 0926 03/29/24 1216  WBC 21.8*  --   CREATININE 0.77  --   LATICACIDVEN  --  2.1*    Estimated Creatinine Clearance: 44.8 mL/min (by C-G formula based on SCr of 0.77 mg/dL).    Allergies  Allergen Reactions   Asa Buff (Mag [Buffered Aspirin] Other (See Comments)    Excessive sweating   Ensure [Nutritional Supplements]     Or boost - upset stomach    Fish Allergy Other (See Comments)    Unknown reaction, but allergic   Other Other (See Comments)    Gelcaps; gel-containing capsules; extended-release medication - upset stomach    Peanut-Containing Drug Products Other (See Comments)    From allergy test   Penicillins Itching   Shellfish Protein-Containing Drug Products Other (See Comments)    From allergy test   Strawberry Extract Nausea And Vomiting and Other (See Comments)    From allergy test       Thank you for allowing pharmacy to be a part of this patient's care.  Britta Eva Na 03/29/2024 4:19 PM

## 2024-03-30 ENCOUNTER — Telehealth: Payer: Self-pay

## 2024-03-30 DIAGNOSIS — K56609 Unspecified intestinal obstruction, unspecified as to partial versus complete obstruction: Secondary | ICD-10-CM | POA: Diagnosis not present

## 2024-03-30 DIAGNOSIS — J910 Malignant pleural effusion: Secondary | ICD-10-CM

## 2024-03-30 DIAGNOSIS — J918 Pleural effusion in other conditions classified elsewhere: Secondary | ICD-10-CM | POA: Diagnosis not present

## 2024-03-30 DIAGNOSIS — J189 Pneumonia, unspecified organism: Secondary | ICD-10-CM | POA: Diagnosis not present

## 2024-03-30 DIAGNOSIS — C3492 Malignant neoplasm of unspecified part of left bronchus or lung: Secondary | ICD-10-CM | POA: Diagnosis not present

## 2024-03-30 LAB — BASIC METABOLIC PANEL WITH GFR
Anion gap: 13 (ref 5–15)
BUN: 10 mg/dL (ref 8–23)
CO2: 22 mmol/L (ref 22–32)
Calcium: 9.6 mg/dL (ref 8.9–10.3)
Chloride: 104 mmol/L (ref 98–111)
Creatinine, Ser: 0.65 mg/dL (ref 0.44–1.00)
GFR, Estimated: >60 mL/min (ref >60)
Glucose, Bld: 121 mg/dL — ABNORMAL HIGH (ref 70–99)
Potassium: 3.5 mmol/L (ref 3.5–5.1)
Sodium: 138 mmol/L (ref 135–145)

## 2024-03-30 LAB — CBC
HCT: 39.8 % (ref 36.0–46.0)
Hemoglobin: 12.6 g/dL (ref 12.0–15.0)
MCH: 28.8 pg (ref 26.0–34.0)
MCHC: 31.7 g/dL (ref 30.0–36.0)
MCV: 90.9 fL (ref 80.0–100.0)
Platelets: 425 K/uL — ABNORMAL HIGH (ref 150–400)
RBC: 4.38 MIL/uL (ref 3.87–5.11)
RDW: 13.2 % (ref 11.5–15.5)
WBC: 24 K/uL — ABNORMAL HIGH (ref 4.0–10.5)
nRBC: 0 % (ref 0.0–0.2)

## 2024-03-30 LAB — MRSA NEXT GEN BY PCR, NASAL: MRSA by PCR Next Gen: NOT DETECTED

## 2024-03-30 LAB — PROCALCITONIN: Procalcitonin: 0.74 ng/mL

## 2024-03-30 LAB — STREP PNEUMONIAE URINARY ANTIGEN: Strep Pneumo Urinary Antigen: NEGATIVE

## 2024-03-30 MED ORDER — ALUM & MAG HYDROXIDE-SIMETH 200-200-20 MG/5ML PO SUSP
30.0000 mL | ORAL | Status: AC | PRN
  Administered 2024-03-30: 30 mL via ORAL
  Filled 2024-03-30: qty 30

## 2024-03-30 MED ORDER — ENOXAPARIN SODIUM 40 MG/0.4ML IJ SOSY
40.0000 mg | PREFILLED_SYRINGE | INTRAMUSCULAR | Status: DC
  Administered 2024-03-30 – 2024-04-02 (×4): 40 mg via SUBCUTANEOUS
  Filled 2024-03-30 (×4): qty 0.4

## 2024-03-30 MED ORDER — DICYCLOMINE HCL 10 MG PO CAPS
10.0000 mg | ORAL_CAPSULE | Freq: Once | ORAL | Status: AC
Start: 2024-03-31 — End: 2024-03-30
  Administered 2024-03-30: 10 mg via ORAL
  Filled 2024-03-30: qty 1

## 2024-03-30 MED ORDER — POLYETHYLENE GLYCOL 3350 17 G PO PACK
17.0000 g | PACK | Freq: Every day | ORAL | Status: DC | PRN
  Administered 2024-03-31: 17 g via ORAL
  Filled 2024-03-30: qty 1

## 2024-03-30 MED ORDER — SIMETHICONE 80 MG PO CHEW
80.0000 mg | CHEWABLE_TABLET | Freq: Once | ORAL | Status: AC
  Administered 2024-03-30: 80 mg via ORAL
  Filled 2024-03-30: qty 1

## 2024-03-30 NOTE — Telephone Encounter (Signed)
 Pt daughter called to get pt scheduled for TOC to [facility] from [facility], can we please contact pt and get her scheduled in [facility] next avail?

## 2024-03-30 NOTE — Evaluation (Signed)
 Physical Therapy Evaluation Patient Details Name: Angelica [name] MRN: [id number] DOB: [birth year] Today's Date: 03/30/2024  History of Present Illness  Angelica [name] is a 83 y.o. female presented to ED with complaints of weakness; admitted with large loculated left pleural effusion. PMH: GERD, HTN, HLD, IDA, asthma, stage 3b adenocarcinoma of the lung, hypothyroidism, CVA, Alzheimer's, SBO due to adhesions in 2016 and 2021  Clinical Impression  Pt admitted with above diagnosis. PTA, pt ind without AD for in home ambulation, completing self care and light household chores, lives with daughter and goes to other daughter's house at times. On eval, pt reports good sensation throughout BLE. Pt requiring increased time and cues for hand placement and sequencing with mobility. Pt powers to stand with BUE assisting, CGA for safety, slow to rise, slightly unsteady without AD reaching for IV pole to steady self once standing. Pt amb 60 ft with RW, slow cadence, increased time and space for turns and while navigating around obstacles, CGA for safety. Pt's only complaint is fatigue stating I'm tired multiple times during eval; on RA with SPO2 96-97%. Recommend HHPT and continued family support at home at d/c. Pt currently with functional limitations due to the deficits listed below (see PT Problem List). Pt will benefit from acute skilled PT to increase their independence and safety with mobility to allow discharge.           If plan is discharge home, recommend the following: A little help with walking and/or transfers;A little help with bathing/dressing/bathroom;Assistance with cooking/housework;Assist for transportation   Can travel by private vehicle        Equipment Recommendations None recommended by PT  Recommendations for Other Services       Functional Status Assessment Patient has had a recent decline in their functional status and demonstrates the ability to make significant improvements  in function in a reasonable and predictable amount of time.     Precautions / Restrictions Precautions Precautions: Fall Restrictions Weight Bearing Restrictions Per Provider Order: No      Mobility  Bed Mobility Overal bed mobility: Needs Assistance Bed Mobility: Supine to Sit     Supine to sit: Supervision     General bed mobility comments: supv, therapist managing IV line    Transfers Overall transfer level: Needs assistance Equipment used: Rolling walker (2 wheels), None Transfers: Sit to/from Stand Sit to Stand: Contact guard assist           General transfer comment: CGA with RW and wtihout, cues for hand placement, cues for safety with mobility    Ambulation/Gait Ambulation/Gait assistance: Contact guard assist Gait Distance (Feet): 60 Feet Assistive device: Rolling walker (2 wheels) Gait Pattern/deviations: Step-through pattern, Decreased stride length, Trunk flexed Gait velocity: decreased     General Gait Details: step through gait pattern wtih forward flexed trunk, minimal bil foot clearance without shuffling noted, increased time and space needed to complete turns and navigate around obstacles, no overt LOB  Stairs            Wheelchair Mobility     Tilt Bed    Modified Rankin (Stroke Patients Only)       Balance Overall balance assessment: Mild deficits observed, not formally tested                                           Pertinent Vitals/Pain Pain Assessment Pain  Assessment: No/denies pain    Home Living Family/patient expects to be discharged to:: Private residence Living Arrangements: Children Available Help at Discharge: Family Type of Home: House Home Access: Level entry       Home Layout: One level Home Equipment: Agricultural Consultant (2 wheels);Cane - single point      Prior Function Prior Level of Function : Independent/Modified Independent             Mobility Comments: pt reports ind  without AD for in home ambulation, limited community ambulator ADLs Comments: pt reports ind with self care, completes sponge baths, completes light household chores, daughter completes heavy household chores     Extremity/Trunk Assessment   Upper Extremity Assessment Upper Extremity Assessment: Overall WFL for tasks assessed    Lower Extremity Assessment Lower Extremity Assessment: Generalized weakness (AROM WFL, strength grossly 3+/5, denies numbness/tingling)    Cervical / Trunk Assessment Cervical / Trunk Assessment: Kyphotic  Communication   Communication Communication: No apparent difficulties    Cognition Arousal: Alert Behavior During Therapy: Flat affect   PT - Cognitive impairments: History of cognitive impairments                       PT - Cognition Comments: pt pleasant, follows commands, flat and reports i'm tired multiple times during eval Following commands: Intact       Cueing       General Comments General comments (skin integrity, edema, etc.): Pt on RA with SpO2 96-97% during session    Exercises     Assessment/Plan    PT Assessment Patient needs continued PT services  PT Problem List Decreased strength;Decreased activity tolerance;Decreased balance;Decreased knowledge of use of DME       PT Treatment Interventions DME instruction;Gait training;Functional mobility training;Therapeutic activities;Therapeutic exercise;Balance training;Cognitive remediation;Patient/family education    PT Goals (Current goals can be found in the Care Plan section)  Acute Rehab PT Goals Patient Stated Goal: agreeable to HHPT recommendation PT Goal Formulation: With patient/family Time For Goal Achievement: 04/13/24 Potential to Achieve Goals: Good    Frequency Min 2X/week     Co-evaluation               AM-PAC PT 6 Clicks Mobility  Outcome Measure Help needed turning from your back to your side while in a flat bed without using  bedrails?: A Little Help needed moving from lying on your back to sitting on the side of a flat bed without using bedrails?: A Little Help needed moving to and from a bed to a chair (including a wheelchair)?: A Little Help needed standing up from a chair using your arms (e.g., wheelchair or bedside chair)?: A Little Help needed to walk in hospital room?: A Little Help needed climbing 3-5 steps with a railing? : A Lot 6 Click Score: 17    End of Session Equipment Utilized During Treatment: Gait belt Activity Tolerance: Patient tolerated treatment well Patient left: in chair;with call bell/phone within reach;with chair alarm set;with nursing/sitter in room;with family/visitor present Nurse Communication: Mobility status PT Visit Diagnosis: Unsteadiness on feet (R26.81);Muscle weakness (generalized) (M62.81)    Time: 8771-8749 PT Time Calculation (min) (ACUTE ONLY): 22 min   Charges:   PT Evaluation $PT Eval Moderate Complexity: 1 Mod   PT General Charges $$ ACUTE PT VISIT: 1 Visit         Tori Makayla Confer PT, DPT 03/30/24, 1:03 PM

## 2024-03-30 NOTE — Progress Notes (Signed)
 PROGRESS NOTE Angelica [name]  FMW:[id number] DOB: 18-Apr-1941 DOA: 03/29/2024 PCP: Norleen Lynwood ORN, MD  Brief Narrative/Hospital Course: Angelica [name] is a 83 y.o. female with PMH of GERD, HTN, HLD, Angelica, asthma, stage 3b adenocarcinoma of the lung, hypothyroidism, hx of CVA, Alzheimer's, hx of SBO due to adhesions in 2016 and 2021 who presented to ED with complaints of weakness and  5 days of c/o w/ breast being heavy or swollen.  Patient had difficulty working at [employer] and also had chest.  At baseline she can bathe and dress on her own. She feeds herself. Can do all ADLs, but cook.  In  E:  vitals: afebrile, bp: 125/75, HR: 91, RR: 18, oxygen: 98%RA wbc: 21.8, platelets 418, lactic acid: 2.1, troponin 27 CXR: Near-complete opacification of the left lung, differential includes large malignant pleural effusion, central hilar mass with obstruction, lobar atelectasis, and/or pneumonia.  CT head: no acute finding CT abdomen/pelvis: Large highly located left pleural effusion with compressive atelectasis throughout the left lung. Central low-attenuation in the left lower lobe may be due to radiation scarring.Small pericardial effusion and trace right pleural effusion. Moderate stool burden.  Rectal prolapse Patient was placed on IV antibiotics, pulmonary consulted and admitted for further management  Subjective: Seen and examined today Daughter and family numbers at the bedside, patient alert awake, having intermittent cough, some stomach upset  No chest pain nausea vomiting or shortness of breath.  She is on room air. Overnight on room air afebrile, VSS, Labs WBC up 21.8> 24 BMP stablek, troponin 27-28 lactic acid normalized 0.7  Assessment and plan:  Large loculated left pleural effusion History of ACC of the lung: Differential includes pulm infection versus recurrent malignancy and sepsis. continue empiric antibiotics vancomycin  and cefepime . Pulmonary has been consulted- await  recommendations.Follow-up culture data, follow-up thoracentesis plan  Generalized weakness/debility: Electrolytes stable PT OT eval, fall precaution  Adenocarcinoma of left lung, stage 3: diagnosed in 05/2020, on observation currently S/p concurrent chemoradiation with weekly carboplatin  for AUC of 2 and paclitaxel  45 mg/M2. S/p 5 cycles- Last August 01, 2020. S/p Consolidation treatment with immunotherapy with Imfinzi  1500 mg IV q 4 wk x 3 cycles- discontinued 2/2 intolerance/sepsis. Given left pleural effusion-pulmonary consulted see above   Elevated troponin: Flat and mildly elevated troponin 27>28, suspect demand ischemia due to #1   HTN  Stable Continue amlodipine  daily   History of stroke  HLD Continue statin, Cont ASA, Plavix    Hypothyroidism Euthyroid TSH 4.0,continue synthroid  50mcg daily    Asthma, severe persistent: No signs of exacerbation, continue bronchodilators   Alzheimer's disease At baseline, Continue namenda . Keep on delirium precautions    GERD: Continue protonix  daily    Constipation Noted stool burden on CT. Cont scheduled colace daily, add prn miralax .  CODE STATUS/goals of care: Discussed with patient and daughter and she is a full code  Mobility: PT Orders: Active  PT Follow up Rec:    DVT prophylaxis: SCDs Start: 03/29/24 1721 Code Status:   Code Status: Full Code Family Communication: plan of care discussed with patient/FAMILY at bedside. Patient status is: Remains hospitalized because of severity of illness Level of care: Telemetry   Dispo: The patient is from: home            Anticipated disposition: TBD Objective: Vitals last 24 hrs: Vitals:   03/29/24 2201 03/29/24 2345 03/30/24 0435 03/30/24 0626  BP: (!) 146/80 (!) 146/80 (!) 151/65 (!) 151/65  Pulse: 82 82 88 88  Resp:  15 15 16 16   Temp: 97.6 F (36.4 C) 97.6 F (36.4 C) 97.8 F (36.6 C) 97.8 F (36.6 C)  TempSrc: Oral Oral Oral Oral  SpO2: 98%  98%   Weight:  59.6 kg   59.6 kg  Height:    5' 5 (1.651 m)    Physical Examination: General exam: alert awake, oriented, older than stated age HEENT:Oral mucosa moist, Ear/Nose WNL grossly Respiratory system: Bilaterally clear BS,no use of accessory muscle Cardiovascular system: S1 & S2 +, No JVD. Gastrointestinal system: Abdomen soft,NT,ND, BS+ Nervous System: Alert, awake, moving all extremities,and following commands. Extremities: extremities warm, leg edema  NEG Skin: Warm, no rashes MSK: Normal muscle bulk,tone, power   Medications reviewed:  Scheduled Meds:  amLODipine   10 mg Oral Daily   clopidogrel   75 mg Oral Daily   docusate sodium   100 mg Oral Daily   fluticasone   1 spray Each Nare Daily   fluticasone  furoate-vilanterol  1 puff Inhalation Daily   ipratropium  2 spray Each Nare TID   levothyroxine   50 mcg Oral Q0600   loratadine   10 mg Oral Daily   memantine   10 mg Oral BID   montelukast   10 mg Oral QHS   pantoprazole   40 mg Oral Daily   pravastatin   80 mg Oral Daily   vitamin B-12  500 mcg Oral Daily   Continuous Infusions:  sodium chloride  75 mL/hr at 03/30/24 0510   ceFEPime  (MAXIPIME ) IV 200 mL/hr at 03/30/24 0330   vancomycin      Diet: Diet Order             Diet regular Room service appropriate? Yes; Fluid consistency: Thin  Diet effective now                    Data Reviewed: I have personally reviewed following labs and imaging studies ( see epic result tab) CBC: Recent Labs  Lab 03/29/24 0926 03/30/24 0426  WBC 21.8* 24.0*  HGB 12.6 12.6  HCT 37.6 39.8  MCV 90.8 90.9  PLT 418* 425*   CMP: Recent Labs  Lab 03/29/24 0926 03/29/24 1835 03/30/24 0426  NA 140  --  138  K 3.9  --  3.5  CL 104  --  104  CO2 24  --  22  GLUCOSE 131*  --  121*  BUN 11  --  10  CREATININE 0.77  --  0.65  CALCIUM  9.0  --  9.6  MG  --  2.0  --    GFR: Estimated Creatinine Clearance: 47.1 mL/min (by C-G formula based on SCr of 0.65 mg/dL). Recent Labs  Lab 03/29/24 0926   AST 19  ALT 10  ALKPHOS 77  BILITOT 0.7  PROT 7.4  ALBUMIN  3.2*   No results for input(s): LIPASE, AMYLASE in the last 168 hours. No results for input(s): AMMONIA in the last 168 hours. Coagulation Profile:  Recent Labs  Lab 03/29/24 1835  INR 1.2   Unresulted Labs (From admission, onward)     Start     Ordered   03/31/24 0500  Basic metabolic panel with GFR  Daily,   R      03/30/24 0745   03/31/24 0500  CBC  Daily,   R      03/30/24 0745   03/30/24 0926  MRSA Next Gen by PCR, Nasal  (MRSA Screening)  Once,   R        03/30/24 0925   03/29/24 1723  Legionella Pneumophila Serogp 1 Ur Ag  Once,   R        03/29/24 1722   03/29/24 1723  Strep pneumoniae urinary antigen  Once,   R        03/29/24 1722   03/29/24 1612  Expectorated Sputum Assessment w Gram Stain, Rflx to Resp Cult  Once,   R        03/29/24 1611           Antimicrobials/Microbiology: Anti-infectives (From admission, onward)    Start     Dose/Rate Route Frequency Ordered Stop   03/30/24 2200  vancomycin  (VANCOREADY) IVPB 750 mg/150 mL        750 mg 150 mL/hr over 60 Minutes Intravenous Every 24 hours 03/29/24 1620     03/30/24 0400  ceFEPIme  (MAXIPIME ) 2 g in sodium chloride  0.9 % 100 mL IVPB        2 g 200 mL/hr over 30 Minutes Intravenous Every 12 hours 03/29/24 1620     03/29/24 1530  vancomycin  (VANCOCIN ) IVPB 1000 mg/200 mL premix        1,000 mg 200 mL/hr over 60 Minutes Intravenous  Once 03/29/24 1517 03/30/24 0228   03/29/24 1530  ceFEPIme  (MAXIPIME ) 2 g in sodium chloride  0.9 % 100 mL IVPB        2 g 200 mL/hr over 30 Minutes Intravenous  Once 03/29/24 1518 03/29/24 1724         Component Value Date/Time   SDES  03/29/2024 1147    BLOOD BLOOD LEFT ARM Performed at [facility], [address], [place], [place] [address]    SPECREQUEST  03/29/2024 1147    BOTTLES DRAWN AEROBIC AND ANAEROBIC Blood Culture results may not be optimal due to an inadequate volume of  blood received in culture bottles Performed at [facility], [address] [place], [place] [address]    CULT  03/29/2024 1147    NO GROWTH < 24 HOURS Performed at [facility], [address], [place], [place] [address]    REPTSTATUS PENDING 03/29/2024 1147    Procedures:    Mennie LAMY, MD [facility] 03/30/2024, 11:24 AM

## 2024-03-30 NOTE — Plan of Care (Signed)

## 2024-03-30 NOTE — Hospital Course (Addendum)
 83yo with h/o GERD, HTN, HLD, IDA, asthma, stage 3b adenocarcinoma of the lung, hypothyroidism, CVA, Alzheimer's dementia, and hx of SBO due to adhesions in 2016 and 2021 who presented on 11/9 with weakness and breast swelling. She was noted to have leukocytosis with lactate of 2.1.  CXR with near-complete opacification of the left lung, differential includes large malignant pleural effusion, central hilar mass with obstruction, lobar atelectasis, and/or pneumonia.  CT A/P with large highly located left pleural effusion with compressive atelectasis throughout the left lung.  She was started on antibiotics, pulmonary consulted.  11/11 also found to have SBO, NG tube placed, surgery consulted.  Underwent lysis of adhesions with small bowel resection on 11/14.  On TPN.  Started calorie count but she is eating so little that this was moot.

## 2024-03-30 NOTE — Consult Note (Signed)
 NAME:  Angelica [name], MRN:  [id number], DOB:  September 18, 1940, LOS: 1 ADMISSION DATE:  03/29/2024, CONSULTATION DATE:  03/30/2024 REFERRING MD:  Dr. Christobal, CHIEF COMPLAINT:  Pleural Effusion   History of Present Illness:   Angelica [name] is an 83 y/o F with a PMH significant for GERD, HTN, HLD, stage 3B NSCLC, hypothyroidism, Alzheimer's dementia who presented for weakness and shortness of breath now found to have a large loculated left pleural effusion. Pulmonology consulted for evaluation and management of the latter.  History mostly obtained from discussion with daughter and chart review. Per daughter, patient developing worsening shortness of breath to the point she was unable to walk without getting really winded. She also noted some chest pressure/pain. Daughter called 911. The patient was brought into the ED and CT C/A/P showed a large left sided loculated pleural effusion.   Of note, patient previously had thoracentesis on left side 10/07/2022. Cytology negative for malignancy at that time. Lymphocytic effusion. LDH low at 71. Protein 3.2.  Pertinent  Medical History   Past Medical History:  Diagnosis Date   Allergy 05/21/1978   Arthritis    Asthma    Bowel obstruction (HCC)    Cancer (HCC)    Cataract    Environmental allergies    GERD (gastroesophageal reflux disease)    Glaucoma 05/09/2017   H. pylori infection    History of radiation therapy 07/05/20-08/05/20   Left lung IMRT Dr. Shannon    HLD (hyperlipidemia) 01/16/2019   Hypertension    Iron  deficiency anemia    Osteopenia 05/26/2017   Thyroid  disease    Urticaria 07/25/2018   Significant Hospital Events: Including procedures, antibiotic start and stop dates in addition to other pertinent events   Patient admitted to [facility], plavix  dose given on 11/10, discontinued plavix  order.  Interim History / Subjective:  Patient has dementia but notes she is feeling well overall. No shortness of breath today. She remains on  RA.  Objective    Blood pressure (!) 151/65, pulse 88, temperature 97.8 F (36.6 C), temperature source Oral, resp. rate 16, height 5' 5 (1.651 m), weight 59.6 kg, SpO2 96%.        Intake/Output Summary (Last 24 hours) at 03/30/2024 1157 Last data filed at 03/30/2024 0900 Gross per 24 hour  Intake 1865.6 ml  Output --  Net 1865.6 ml   Filed Weights   03/29/24 1600 03/29/24 2345 03/30/24 0626  Weight: 59 kg 59.6 kg 59.6 kg    Examination: General: cachetic appearing, bitemporal wasting HENT: anicteric sclera, well injected conjunctivae Lungs: Decreased breath sounds in left lower lung field Cardiovascular: RRR, no murmurs Abdomen: soft, non-tender Extremities: no edema Neuro: awake, alert, follows commands GU: deferred  Resolved problem list   Assessment and Plan   #Large Loculated Pleural Effusion: > Unclear etiology. Ddx includes infectious etiology versus malignant. - Given that patient received Plavix  this AM, will hold off on chest tube placement until at least 48 hours unless patient's clinical status deteriorates like developing worsening oxygenation or shock state. - Once chest tube is placed, will send off pleural fluid for cell count, chemistries, cytology, and microbiology - Agree with antimicrobials per primary medical team - I discussed risks/benefits of chest tube placement with family.  #Hx of CVA: - Currently on DAPT, stroke occurred in 2023 - Hold Plavix  for pleural intervention as noted above.  Labs   CBC: Recent Labs  Lab 03/29/24 0926 03/30/24 0426  WBC 21.8* 24.0*  HGB 12.6 12.6  HCT 37.6 39.8  MCV 90.8 90.9  PLT 418* 425*    Basic Metabolic Panel: Recent Labs  Lab 03/29/24 0926 03/29/24 1835 03/30/24 0426  NA 140  --  138  K 3.9  --  3.5  CL 104  --  104  CO2 24  --  22  GLUCOSE 131*  --  121*  BUN 11  --  10  CREATININE 0.77  --  0.65  CALCIUM  9.0  --  9.6  MG  --  2.0  --    GFR: Estimated Creatinine Clearance: 47.1  mL/min (by C-G formula based on SCr of 0.65 mg/dL). Recent Labs  Lab 03/29/24 0926 03/29/24 1216 03/29/24 1835 03/30/24 0426  PROCALCITON  --   --  0.76 0.74  WBC 21.8*  --   --  24.0*  LATICACIDVEN  --  2.1*  --   --     Liver Function Tests: Recent Labs  Lab 03/29/24 0926  AST 19  ALT 10  ALKPHOS 77  BILITOT 0.7  PROT 7.4  ALBUMIN  3.2*   No results for input(s): LIPASE, AMYLASE in the last 168 hours. No results for input(s): AMMONIA in the last 168 hours.  ABG    Component Value Date/Time   TCO2 25 08/22/2016 1818     Coagulation Profile: Recent Labs  Lab 03/29/24 1835  INR 1.2    Cardiac Enzymes: No results for input(s): CKTOTAL, CKMB, CKMBINDEX, TROPONINI in the last 168 hours.  HbA1C: Hgb A1c MFr Bld  Date/Time Value Ref Range Status  07/18/2021 08:35 AM 5.8 4.6 - 6.5 % Final    Comment:    Glycemic Control Guidelines for People with Diabetes:Non Diabetic:  <6%Goal of Therapy: <7%Additional Action Suggested:  >8%   05/03/2020 05:07 PM 5.8 4.6 - 6.5 % Final    Comment:    Glycemic Control Guidelines for People with Diabetes:Non Diabetic:  <6%Goal of Therapy: <7%Additional Action Suggested:  >8%     CBG: Recent Labs  Lab 03/29/24 0958  GLUCAP 110*    Review of Systems:   Not obtained  Past Medical History:  She,  has a past medical history of Allergy (05/21/1978), Arthritis, Asthma, Bowel obstruction (HCC), Cancer (HCC), Cataract, Environmental allergies, GERD (gastroesophageal reflux disease), Glaucoma (05/09/2017), H. pylori infection, History of radiation therapy (07/05/20-08/05/20), HLD (hyperlipidemia) (01/16/2019), Hypertension, Iron  deficiency anemia, Osteopenia (05/26/2017), Thyroid  disease, and Urticaria (07/25/2018).   Surgical History:   Past Surgical History:  Procedure Laterality Date   ABDOMINAL HYSTERECTOMY     BOWEL RESECTION N/A 05/23/2020   Procedure: SMALL BOWEL REPAIR;  Surgeon: Kinsinger, Herlene Righter, MD;   Location: [facility] OR;  Service: General;  Laterality: N/A;   BREAST BIOPSY     BRONCHIAL BIOPSY  06/10/2020   Procedure: BRONCHIAL BIOPSIES;  Surgeon: Brenna Adine CROME, DO;  Location: [facility];  Service: Pulmonary;;   BRONCHIAL BRUSHINGS  06/10/2020   Procedure: BRONCHIAL BRUSHINGS;  Surgeon: Brenna Adine CROME, DO;  Location: [facility];  Service: Pulmonary;;   BRONCHIAL NEEDLE ASPIRATION BIOPSY  06/10/2020   Procedure: BRONCHIAL NEEDLE ASPIRATION BIOPSIES;  Surgeon: Brenna Adine CROME, DO;  Location: [facility];  Service: Pulmonary;;   BRONCHIAL WASHINGS  06/10/2020   Procedure: BRONCHIAL WASHINGS;  Surgeon: Brenna Adine CROME, DO;  Location: [facility];  Service: Pulmonary;;   COLON SURGERY     DIAGNOSTIC LARYNGOSCOPY N/A 05/23/2020   Procedure: ATTEMPTED DIAGNOSTIC LAPAROSCOPY WITH ADHESIONS;  Surgeon: Stevie Herlene Righter, MD;  Location: [facility] OR;  Service: General;  Laterality: N/A;   EYE SURGERY     IR IMAGING GUIDED PORT INSERTION  07/15/2020   IR REMOVAL TUN ACCESS W/ PORT W/O FL MOD SED  01/16/2023   KNEE SURGERY     LAPAROTOMY N/A 04/18/2015   Procedure: Exploratory laparotomy with lysis of adhesions, possible bowel resection;  Surgeon: Lynda Leos, MD;  Location: [facility] OR;  Service: General;  Laterality: N/A;   LAPAROTOMY N/A 05/23/2020   Procedure: EXPLORATORY LAPAROTOMY;  Surgeon: Stevie Herlene Righter, MD;  Location: [facility];  Service: General;  Laterality: N/A;   LYSIS OF ADHESION N/A 05/23/2020   Procedure: LYSIS OF ADHESION;  Surgeon: Stevie Herlene Righter, MD;  Location: [facility] OR;  Service: General;  Laterality: N/A;   NASAL SINUS SURGERY     SMALL INTESTINE SURGERY     THORACENTESIS Left 12/03/2022   Procedure: THORACENTESIS;  Surgeon: Brenna Adine CROME, DO;  Location: [facility];  Service: Pulmonary;  Laterality: Left;   THYROID  SURGERY     VIDEO BRONCHOSCOPY WITH ENDOBRONCHIAL NAVIGATION Bilateral 06/10/2020   Procedure: VIDEO BRONCHOSCOPY WITH ENDOBRONCHIAL NAVIGATION;   Surgeon: Brenna Adine CROME, DO;  Location: [facility];  Service: Pulmonary;  Laterality: Bilateral;   VIDEO BRONCHOSCOPY WITH ENDOBRONCHIAL ULTRASOUND  06/10/2020   Procedure: VIDEO BRONCHOSCOPY WITH ENDOBRONCHIAL ULTRASOUND;  Surgeon: Brenna Adine CROME, DO;  Location: [facility];  Service: Pulmonary;;     Social History:   reports that she quit smoking about 53 years ago. Her smoking use included cigarettes. She has been exposed to tobacco smoke. She has never used smokeless tobacco. She reports that she does not drink alcohol and does not use drugs.   Family History:  Her family history includes Diabetes in her mother; Glaucoma in her brother and sister; Hypertension in her father and mother. There is no history of Allergic rhinitis, Angioedema, Asthma, Eczema, Immunodeficiency, Urticaria, BRCA 1/2, or Breast cancer.   Allergies Allergies  Allergen Reactions   Asa Buff (Mag [Buffered Aspirin] Other (See Comments)    Excessive sweating   Ensure [Nutritional Supplements]     Or boost - upset stomach    Fish Allergy Other (See Comments)    Unknown reaction, but allergic   Other Other (See Comments)    Gelcaps; gel-containing capsules; extended-release medication - upset stomach    Peanut-Containing Drug Products Other (See Comments)    From allergy test   Penicillins Itching   Shellfish Protein-Containing Drug Products Other (See Comments)    From allergy test   Strawberry Extract Nausea And Vomiting and Other (See Comments)    From allergy test      Home Medications  Prior to Admission medications   Medication Sig Start Date End Date Taking? Authorizing Provider  acetaminophen  (TYLENOL ) 500 MG tablet Take 1,000 mg by mouth every 6 (six) hours as needed for moderate pain.   Yes [provider]  albuterol  (VENTOLIN  HFA) 108 (90 Base) MCG/ACT inhaler Inhale 2 puffs into the lungs every 4 (four) hours as needed for wheezing or shortness of breath. 01/03/24  Yes Padgett,  Danita Macintosh, MD  alum & mag hydroxide-simeth (MAALOX/MYLANTA) 200-200-20 MG/5ML suspension Take 15 mLs by mouth every 6 (six) hours as needed for indigestion or heartburn.   Yes [provider]  amLODipine  (NORVASC ) 10 MG tablet Take 1 tablet (10 mg total) by mouth daily. 06/17/23  Yes Norleen Lynwood ORN, MD  benralizumab  (FASENRA ) 30 MG/ML prefilled syringe Inject 1 mL (30 mg total) into the skin every 8 (eight) weeks. 07/15/23  Yes Jeneal Danita Macintosh, MD  cetirizine  [facility] ALLERGY RELIEF, CETIRIZINE ,) 10 MG tablet Take 1 tablet (10 mg total) by mouth daily. 01/03/24  Yes Jeneal Danita Macintosh, MD  clopidogrel  (PLAVIX ) 75 MG tablet Take 1 tablet by mouth once daily 07/18/23  Yes Norleen Lynwood ORN, MD  diclofenac  Sodium (VOLTAREN ) 1 % GEL Apply 4 g topically 4 (four) times daily. Patient taking differently: Apply 4 g topically 4 (four) times daily as needed (pain). 04/23/22  Yes Corey, Evan S, MD  EPINEPHrine  (EPIPEN  2-PAK) 0.3 mg/0.3 mL IJ SOAJ injection Use as directed for severe allergic reaction 01/03/24  Yes Padgett, Danita Macintosh, MD  fluticasone  (FLONASE ) 50 MCG/ACT nasal spray USE 1 SPRAY(S) IN EACH NOSTRIL IN THE MORNING AND AT BEDTIME Patient taking differently: Place 1 spray into both nostrils daily. 01/14/24  Yes Padgett, Danita Macintosh, MD  ipratropium (ATROVENT ) 0.06 % nasal spray Place 2 sprays into both nostrils 3 (three) times daily as needed for rhinitis. 01/03/24  Yes Jeneal Danita Macintosh, MD  levothyroxine  (SYNTHROID ) 50 MCG tablet TAKE 1 TABLET BY MOUTH ONCE DAILY BEFORE BREAKFAST 03/10/24  Yes Norleen Lynwood ORN, MD  memantine  (NAMENDA ) 10 MG tablet Take 1 tablet (10 mg total) by mouth 2 (two) times daily. 10/30/23  Yes McCue, Harlene, NP  montelukast  (SINGULAIR ) 10 MG tablet Take 1 tablet (10 mg total) by mouth at bedtime. 01/03/24  Yes Padgett, Danita Macintosh, MD  ondansetron  (ZOFRAN -ODT) 4 MG disintegrating tablet Take 1 tablet (4 mg total) by mouth every 8 (eight)  hours as needed for nausea or vomiting. 07/09/22  Yes Merlynn Eland F, FNP  pantoprazole  (PROTONIX ) 40 MG tablet Take 1 tablet by mouth once daily 12/30/23  Yes Norleen Lynwood ORN, MD  pravastatin  (PRAVACHOL ) 80 MG tablet Take 1 tablet by mouth once daily 04/29/23  Yes Norleen Lynwood ORN, MD  SYMBICORT  80-4.5 MCG/ACT inhaler INHALE 2 PUFFS BY MOUTH IN THE MORNING 01/03/24  Yes Padgett, Danita Macintosh, MD  gentamicin  ointment (GARAMYCIN ) 0.1 % Apply 1 Application topically daily. Apply to wound daily between toes.  Use a very small amount. Patient not taking: Reported on 03/10/2024 08/27/23   McCaughan, Dia D, DPM  Magnesium  Hydroxide (PHILLIPS MILK OF MAGNESIA PO) Take 15-30 mLs by mouth daily as needed (for constipation).    [provider]  potassium chloride  SA (KLOR-CON  M) 20 MEQ tablet Take 2 tablets (40 mEq total) by mouth daily as needed. Take potasium days you take lasix . Patient not taking: Reported on 03/10/2024 01/03/23   Norleen Lynwood ORN, MD     Thank you for this interesting consult. I have spent 60 minutes evaluating patient, reviewing chart, and discussing plan of care with patient, family, and primary medical team. If you have any questions or concerns please reach out to me via pager ([phone]).  Paula Southerly, MD [facility]

## 2024-03-31 ENCOUNTER — Encounter (HOSPITAL_COMMUNITY): Payer: Self-pay | Admitting: Family Medicine

## 2024-03-31 ENCOUNTER — Inpatient Hospital Stay (HOSPITAL_COMMUNITY)

## 2024-03-31 DIAGNOSIS — J918 Pleural effusion in other conditions classified elsewhere: Secondary | ICD-10-CM | POA: Diagnosis not present

## 2024-03-31 DIAGNOSIS — J189 Pneumonia, unspecified organism: Secondary | ICD-10-CM | POA: Diagnosis not present

## 2024-03-31 LAB — BASIC METABOLIC PANEL WITH GFR
Anion gap: 14 (ref 5–15)
BUN: 15 mg/dL (ref 8–23)
CO2: 19 mmol/L — ABNORMAL LOW (ref 22–32)
Calcium: 9.5 mg/dL (ref 8.9–10.3)
Chloride: 103 mmol/L (ref 98–111)
Creatinine, Ser: 0.65 mg/dL (ref 0.44–1.00)
GFR, Estimated: >60 mL/min (ref >60)
Glucose, Bld: 108 mg/dL — ABNORMAL HIGH (ref 70–99)
Potassium: 3.5 mmol/L (ref 3.5–5.1)
Sodium: 136 mmol/L (ref 135–145)

## 2024-03-31 LAB — CBC
HCT: 35.9 % — ABNORMAL LOW (ref 36.0–46.0)
Hemoglobin: 11.9 g/dL — ABNORMAL LOW (ref 12.0–15.0)
MCH: 30.1 pg (ref 26.0–34.0)
MCHC: 33.1 g/dL (ref 30.0–36.0)
MCV: 90.9 fL (ref 80.0–100.0)
Platelets: 413 K/uL — ABNORMAL HIGH (ref 150–400)
RBC: 3.95 MIL/uL (ref 3.87–5.11)
RDW: 13.3 % (ref 11.5–15.5)
WBC: 23 K/uL — ABNORMAL HIGH (ref 4.0–10.5)
nRBC: 0 % (ref 0.0–0.2)

## 2024-03-31 MED ORDER — PANTOPRAZOLE SODIUM 40 MG PO TBEC
40.0000 mg | DELAYED_RELEASE_TABLET | Freq: Two times a day (BID) | ORAL | Status: DC
  Administered 2024-03-31: 40 mg via ORAL
  Filled 2024-03-31: qty 1

## 2024-03-31 MED ORDER — FENTANYL CITRATE (PF) 50 MCG/ML IJ SOSY
12.5000 ug | PREFILLED_SYRINGE | Freq: Once | INTRAMUSCULAR | Status: AC
  Administered 2024-03-31: 12.5 ug via INTRAVENOUS
  Filled 2024-03-31: qty 1

## 2024-03-31 MED ORDER — CARMEX CLASSIC LIP BALM EX OINT
TOPICAL_OINTMENT | CUTANEOUS | Status: DC | PRN
  Administered 2024-04-02: 1 via TOPICAL
  Filled 2024-03-31 (×2): qty 10

## 2024-03-31 MED ORDER — IOHEXOL 300 MG/ML  SOLN
100.0000 mL | Freq: Once | INTRAMUSCULAR | Status: AC | PRN
  Administered 2024-03-31: 100 mL via INTRAVENOUS

## 2024-03-31 MED ORDER — IOHEXOL 9 MG/ML PO SOLN
ORAL | Status: AC
  Filled 2024-03-31: qty 1000

## 2024-03-31 MED ORDER — SIMETHICONE 80 MG PO CHEW
80.0000 mg | CHEWABLE_TABLET | Freq: Once | ORAL | Status: AC
  Administered 2024-03-31: 80 mg via ORAL
  Filled 2024-03-31: qty 1

## 2024-03-31 MED ORDER — IOHEXOL 9 MG/ML PO SOLN
500.0000 mL | ORAL | Status: AC
  Administered 2024-03-31 (×2): 500 mL via ORAL

## 2024-03-31 MED ORDER — LACTATED RINGERS IV SOLN: INTRAVENOUS | Status: AC

## 2024-03-31 MED ORDER — METOCLOPRAMIDE HCL 5 MG/ML IJ SOLN
5.0000 mg | Freq: Three times a day (TID) | INTRAMUSCULAR | Status: DC | PRN
  Administered 2024-04-02 (×2): 5 mg via INTRAVENOUS
  Filled 2024-03-31 (×3): qty 2

## 2024-03-31 NOTE — Plan of Care (Signed)

## 2024-03-31 NOTE — TOC Initial Note (Signed)
 Transition of Care [facility]) - Initial/Assessment Note   Patient Details  Name: Angelica [name] MRN: [id number] Date of Birth: Feb 27, 1941  [facility]) CM/SW Contact:    Duwaine GORMAN Aran, LCSW Phone Number: 03/31/2024, 12:49 PM  Clinical Narrative: PT evaluation recommended HH. CSW met with patient with daughter, Dejia Ebron, on the phone to discuss recommendations. Daughter agreeable to HHPT referral being made and reported the patient will being staying with her other daughter during the day at [address], [place], [place] [address]. CSW made HHPT referral in hub. Care management awaiting [facility] agency offers.  Expected Discharge Plan: Home w Home Health Services Barriers to Discharge: Continued Medical Work up  Patient Goals and CMS Choice Patient states their goals for this hospitalization and ongoing recovery are:: Get HHPT CMS Medicare.gov Compare Post Acute Care list provided to:: Patient Choice offered to / list presented to : Patient, Adult Children  Expected Discharge Plan and Services In-house Referral: Clinical Social Work Post Acute Care Choice: Home Health Living arrangements for the past 2 months: Single Family Home            DME Arranged: N/A DME Agency: NA  Prior Living Arrangements/Services Living arrangements for the past 2 months: Single Family Home Lives with:: Adult Children Patient language and need for interpreter reviewed:: Yes Do you feel safe going back to the place where you live?: Yes      Need for Family Participation in Patient Care: Yes (Comment) Care giver support system in place?: Yes (comment) Criminal Activity/Legal Involvement Pertinent to Current Situation/Hospitalization: No - Comment as needed  Activities of Daily Living ADL Screening (condition at time of admission) Independently performs ADLs?: Yes (appropriate for developmental age) Is the patient deaf or have difficulty hearing?: No Does the patient have difficulty seeing, even  when wearing glasses/contacts?: No Does the patient have difficulty concentrating, remembering, or making decisions?: Yes  Permission Sought/Granted Permission granted to share information with : Yes, Verbal Permission Granted Permission granted to share info w AGENCY: HH agencies  Emotional Assessment Appearance:: Appears stated age Attitude/Demeanor/Rapport: Engaged Affect (typically observed): Appropriate Orientation: : Oriented to Self, Oriented to Place Alcohol / Substance Use: Not Applicable Psych Involvement: No (comment)  Admission diagnosis:  Pleural effusion associated with pulmonary infection [J18.9, J91.8] Weakness [R53.1] Pleural effusion [J90] Generalized abdominal pain [R10.84] Leukocytosis, unspecified type [D72.829] Patient Active Problem List   Diagnosis Date Noted   Pleural effusion associated with pulmonary infection vs. recurrent malignancy and sepsis 03/29/2024   Elevated troponin 03/29/2024   Sepsis (HCC) 03/29/2024   Low back pain 10/13/2023   Arthritis of right knee 06/20/2023   Corn of toe 06/20/2023   Mastodynia of left breast 02/13/2023   Pleural effusion 11/21/2022   Swelling of breast 11/17/2022   Dyspnea 11/14/2022   Iron  deficiency anemia 10/11/2022   Acute respiratory failure with hypoxia (HCC) 10/05/2022   Bilateral pleural effusion 10/05/2022   History of stroke 10/05/2022   Weakness 10/05/2022   Edema 10/05/2022   Alzheimer's disease (HCC) 09/06/2022   Ex-smoker 09/06/2022   Hypothyroidism 09/06/2022   Right leg pain 07/13/2021   Dementia (HCC) 07/13/2021   RLQ abdominal pain 07/13/2021   Anosmia 05/01/2021   Chronic ethmoidal sinusitis 02/14/2021   Debility 02/01/2021   Gait disorder 02/01/2021   Port-A-Cath in place 11/02/2020   Adenocarcinoma of left lung, stage 3 (HCC) 06/22/2020   Lung mass    Pulmonary nodule 05/17/2020   Iron  deficiency 05/03/2020   Swelling of  lower extremity 07/30/2019   Ganglion cyst of right foot  01/16/2019   HLD (hyperlipidemia) 01/16/2019   LPRD (laryngopharyngeal reflux disease) 07/25/2018   Constipation 08/19/2017   Asthma, severe persistent, well-controlled (HCC) 06/04/2017   Chronic pansinusitis 06/04/2017   Nasal polyposis 06/04/2017   Osteopenia 05/26/2017   GERD (gastroesophageal reflux disease)    H. pylori infection    Other allergic rhinitis 03/20/2016   Malnutrition of moderate degree 04/11/2015   HTN (hypertension) 04/10/2015   PCP:  Norleen Lynwood ORN, MD Pharmacy:   [facility] [address] [place] - [address] [place] [address] Phone: [phone] Fax: [phone]  Social Drivers of Health (SDOH) Social History: SDOH Screenings   Food Insecurity: No Food Insecurity (03/29/2024)  Housing: Low Risk  (03/29/2024)  Transportation Needs: No Transportation Needs (03/29/2024)  Utilities: Not At Risk (03/29/2024)  Alcohol Screen: Low Risk  (08/20/2023)  Depression (PHQ2-9): Low Risk  (12/17/2023)  Financial Resource Strain: Low Risk  (12/15/2023)  Physical Activity: Inactive (12/15/2023)  Social Connections: Moderately Integrated (12/15/2023)  Stress: No Stress Concern Present (12/15/2023)  Tobacco Use: Medium Risk (03/29/2024)  Health Literacy: Adequate Health Literacy (08/20/2023)   SDOH Interventions:    Readmission Risk Interventions    10/08/2022    2:08 PM  Readmission Risk Prevention Plan  Transportation Screening Complete  PCP or Specialist Appt within 5-7 Days Complete  Home Care Screening Complete  Medication Review (RN CM) Complete

## 2024-03-31 NOTE — Progress Notes (Addendum)
 Patient vomited about 40 minutes after taking morning PO medications. IV zofran  was given prior to meds in an effort to help. This emesis occurred yesterday as well, making it difficult to know how effective the morning PO meds are. Provider notified.

## 2024-03-31 NOTE — Consult Note (Signed)
 Angelica CINNAMON Dec 11, 1940  [id number].    Requesting MD: Dr. Mennie LAMY Chief Complaint/Reason for Consult: SBO  HPI:  This is a pleasant 83 yo black female with a history of stage III adenocarcinoma of the lung, HLD, HTN, GERD, hypothyroidism, h/o CVA, and Alzheimer's who has had 2 previous bowel obstructions, both requiring surgical exploration in 2016 by Dr. Rubin and 2022 by Dr. Stevie.  The patient presents currently for weakness and SOB.  She has been found to have a large loculated left pleural effusion as well as a WBC of 24K.  PCCM is following and plan for a chest tube placement on 11/12 after a plavix  hold.  On admission she had a CT CAP which was unremarkable in regards to her abdomen.  However, on HD 1, yesterday, the patient began having some abdominal distention with N/V.  She hasn't had a BM since admission.  Unclear when her last one was.  Her abdomen has become more distended and she is more uncomfortable.  She vomited again today.  A plain film was obtained that revealed dilated small bowel with a concern for obstructive process.  We have been asked to see the patient.   ROS: ROS: see HPI  Family History  Problem Relation Age of Onset   Diabetes Mother    Hypertension Mother    Hypertension Father    Glaucoma Sister    Glaucoma Brother    Allergic rhinitis Neg Hx    Angioedema Neg Hx    Asthma Neg Hx    Eczema Neg Hx    Immunodeficiency Neg Hx    Urticaria Neg Hx    BRCA 1/2 Neg Hx    Breast cancer Neg Hx     Past Medical History:  Diagnosis Date   Allergy 05/21/1978   Arthritis    Asthma    Bowel obstruction (HCC)    Cancer (HCC)    Cataract    Environmental allergies    GERD (gastroesophageal reflux disease)    Glaucoma 05/09/2017   H. pylori infection    History of radiation therapy 07/05/20-08/05/20   Left lung IMRT Dr. Shannon    HLD (hyperlipidemia) 01/16/2019   Hypertension    Iron  deficiency anemia    Osteopenia 05/26/2017   Thyroid   disease    Urticaria 07/25/2018    Past Surgical History:  Procedure Laterality Date   ABDOMINAL HYSTERECTOMY     BOWEL RESECTION N/A 05/23/2020   Procedure: SMALL BOWEL REPAIR;  Surgeon: Stevie, Herlene Righter, MD;  Location: [facility];  Service: General;  Laterality: N/A;   BREAST BIOPSY     BRONCHIAL BIOPSY  06/10/2020   Procedure: BRONCHIAL BIOPSIES;  Surgeon: Brenna Adine CROME, DO;  Location: [facility];  Service: Pulmonary;;   BRONCHIAL BRUSHINGS  06/10/2020   Procedure: BRONCHIAL BRUSHINGS;  Surgeon: Brenna Adine CROME, DO;  [facility];  Service: Pulmonary;;   BRONCHIAL NEEDLE ASPIRATION BIOPSY  06/10/2020   Procedure: BRONCHIAL NEEDLE ASPIRATION BIOPSIES;  Surgeon: Brenna Adine CROME, DO;  [facility];  Service: Pulmonary;;   BRONCHIAL WASHINGS  06/10/2020   Procedure: BRONCHIAL WASHINGS;  Surgeon: Brenna Adine CROME, DO;  [facility];  Service: Pulmonary;;   COLON SURGERY     DIAGNOSTIC LARYNGOSCOPY N/A 05/23/2020   Procedure: ATTEMPTED DIAGNOSTIC LAPAROSCOPY WITH ADHESIONS;  Surgeon: Stevie, Herlene Righter, MD;  [facility];  Service: General;  Laterality: N/A;   EYE SURGERY     IR IMAGING GUIDED PORT INSERTION  07/15/2020   IR REMOVAL TUN ACCESS W/ PORT W/O FL MOD SED  01/16/2023   KNEE SURGERY     LAPAROTOMY N/A 04/18/2015   Procedure: Exploratory laparotomy with lysis of adhesions, possible bowel resection;  Surgeon: Lynda Leos, MD;  [facility];  Service: General;  Laterality: N/A;   LAPAROTOMY N/A 05/23/2020   Procedure: EXPLORATORY LAPAROTOMY;  Surgeon: Stevie Herlene Righter, MD;  [facility];  Service: General;  Laterality: N/A;   LYSIS OF ADHESION N/A 05/23/2020   Procedure: LYSIS OF ADHESION;  Surgeon: Stevie Herlene Righter, MD;  Location: [facility] OR;  Service: General;  Laterality: N/A;   NASAL SINUS SURGERY     SMALL INTESTINE SURGERY     THORACENTESIS Left 12/03/2022   Procedure: THORACENTESIS;  Surgeon: Brenna Adine CROME, DO;   [facility];  Service: Pulmonary;  Laterality: Left;   THYROID  SURGERY     VIDEO BRONCHOSCOPY WITH ENDOBRONCHIAL NAVIGATION Bilateral 06/10/2020   Procedure: VIDEO BRONCHOSCOPY WITH ENDOBRONCHIAL NAVIGATION;  Surgeon: Brenna Adine CROME, DO;  [facility];  Service: Pulmonary;  Laterality: Bilateral;   VIDEO BRONCHOSCOPY WITH ENDOBRONCHIAL ULTRASOUND  06/10/2020   Procedure: VIDEO BRONCHOSCOPY WITH ENDOBRONCHIAL ULTRASOUND;  Surgeon: Brenna Adine CROME, DO;  [facility];  Service: Pulmonary;;    Social History:  reports that she quit smoking about 53 years ago. Her smoking use included cigarettes. She has been exposed to tobacco smoke. She has never used smokeless tobacco. She reports that she does not drink alcohol and does not use drugs.  Allergies:  Allergies  Allergen Reactions   Asa Buff (Mag [Buffered Aspirin] Other (See Comments)    Excessive sweating   Ensure [Nutritional Supplements]     Or boost - upset stomach    Fish Allergy Other (See Comments)    Unknown reaction, but allergic   Other Other (See Comments)    Gelcaps; gel-containing capsules; extended-release medication - upset stomach    Peanut-Containing Drug Products Other (See Comments)    From allergy test   Penicillins Itching   Shellfish Protein-Containing Drug Products Other (See Comments)    From allergy test   Strawberry Extract Nausea And Vomiting and Other (See Comments)    From allergy test     Facility-Administered Medications Prior to Admission  Medication Dose Route Frequency Provider Last Rate Last Admin   Benralizumab  SOSY 30 mg  30 mg Subcutaneous Q8 Weeks Padgett, Shaylar Patricia, MD   30 mg at 03/11/24 9145   Medications Prior to Admission  Medication Sig Dispense Refill   acetaminophen  (TYLENOL ) 500 MG tablet Take 1,000 mg by mouth every 6 (six) hours as needed for moderate pain.     albuterol  (VENTOLIN  HFA) 108 (90 Base) MCG/ACT inhaler Inhale 2 puffs into the lungs  every 4 (four) hours as needed for wheezing or shortness of breath. 18 g 1   alum & mag hydroxide-simeth (MAALOX/MYLANTA) 200-200-20 MG/5ML suspension Take 15 mLs by mouth every 6 (six) hours as needed for indigestion or heartburn.     amLODipine  (NORVASC ) 10 MG tablet Take 1 tablet (10 mg total) by mouth daily. 90 tablet 3   benralizumab  (FASENRA ) 30 MG/ML prefilled syringe Inject 1 mL (30 mg total) into the skin every 8 (eight) weeks. 1 mL 6   cetirizine  (EQ ALLERGY RELIEF, CETIRIZINE ,) 10 MG tablet Take 1 tablet (10 mg total) by mouth daily. 90 tablet 3   clopidogrel  (PLAVIX ) 75 MG tablet Take 1 tablet by mouth once daily 90 tablet 3  diclofenac  Sodium (VOLTAREN ) 1 % GEL Apply 4 g topically 4 (four) times daily. (Patient taking differently: Apply 4 g topically 4 (four) times daily as needed (pain).) 100 g 0   EPINEPHrine  (EPIPEN  2-PAK) 0.3 mg/0.3 mL IJ SOAJ injection Use as directed for severe allergic reaction 2 each 1   fluticasone  (FLONASE ) 50 MCG/ACT nasal spray USE 1 SPRAY(S) IN EACH NOSTRIL IN THE MORNING AND AT BEDTIME (Patient taking differently: Place 1 spray into both nostrils daily.) 16 g 5   ipratropium (ATROVENT ) 0.06 % nasal spray Place 2 sprays into both nostrils 3 (three) times daily as needed for rhinitis. 15 mL 11   levothyroxine  (SYNTHROID ) 50 MCG tablet TAKE 1 TABLET BY MOUTH ONCE DAILY BEFORE BREAKFAST 90 tablet 0   memantine  (NAMENDA ) 10 MG tablet Take 1 tablet (10 mg total) by mouth 2 (two) times daily. 180 tablet 3   montelukast  (SINGULAIR ) 10 MG tablet Take 1 tablet (10 mg total) by mouth at bedtime. 90 tablet 3   ondansetron  (ZOFRAN -ODT) 4 MG disintegrating tablet Take 1 tablet (4 mg total) by mouth every 8 (eight) hours as needed for nausea or vomiting. 10 tablet 0   pantoprazole  (PROTONIX ) 40 MG tablet Take 1 tablet by mouth once daily 90 tablet 0   pravastatin  (PRAVACHOL ) 80 MG tablet Take 1 tablet by mouth once daily 90 tablet 3   SYMBICORT  80-4.5 MCG/ACT inhaler  INHALE 2 PUFFS BY MOUTH IN THE MORNING 10.2 g 11   gentamicin  ointment (GARAMYCIN ) 0.1 % Apply 1 Application topically daily. Apply to wound daily between toes.  Use a very small amount. (Patient not taking: Reported on 03/10/2024) 30 g 0   Magnesium  Hydroxide (PHILLIPS MILK OF MAGNESIA PO) Take 15-30 mLs by mouth daily as needed (for constipation).     potassium chloride  SA (KLOR-CON  M) 20 MEQ tablet Take 2 tablets (40 mEq total) by mouth daily as needed. Take potasium days you take lasix . (Patient not taking: Reported on 03/10/2024) 60 tablet 5     Physical Exam: Blood pressure (!) 159/85, pulse 93, temperature 98.1 F (36.7 C), temperature source Oral, resp. rate 15, height 5' 5 (1.651 m), weight 59.6 kg, SpO2 94%. General: pleasant, WD, WN, elderly black female who is sitting up in her chair and intermittent making some moaning sounds in discomfort. HEENT: head is normocephalic, atraumatic.  Sclera are noninjected.  PERRL.  Ears and nose without any masses or lesions.  Mouth is pink and moist Heart: some irregularity, recent EKGs with PACs.  Normal s1,s2. No obvious murmurs, gallops, or rubs noted.   Lungs: CTA on right side.  Significantly diminished BS on left side, no wheezes, rhonchi, or rales noted.  Respiratory effort nonlabored Abd: soft, but with distention, especially in her upper abdomen.  Tender to palpation but no peritonitis or rebound. Hypoactive BS, no masses, hernias, or organomegaly.  Lower midline incision from previous surgery Psych: A&Ox3 with an appropriate affect.   Results for orders placed or performed during the hospital encounter of 03/29/24 (from the past 48 hours)  Urinalysis, Routine w reflex microscopic -Urine, Clean Catch     Status: Abnormal   Collection Time: 03/29/24  4:53 PM  Result Value Ref Range   Color, Urine YELLOW YELLOW   APPearance HAZY (A) CLEAR   Specific Gravity, Urine >1.046 (H) 1.005 - 1.030   pH 6.0 5.0 - 8.0   Glucose, UA NEGATIVE  NEGATIVE mg/dL   Hgb urine dipstick NEGATIVE NEGATIVE   Bilirubin Urine NEGATIVE NEGATIVE  Ketones, ur 5 (A) NEGATIVE mg/dL   Protein, ur NEGATIVE NEGATIVE mg/dL   Nitrite NEGATIVE NEGATIVE   Leukocytes,Ua NEGATIVE NEGATIVE    Comment: Performed at [facility], [address], [place], [place] [address]  Strep pneumoniae urinary antigen     Status: None   Collection Time: 03/29/24  4:53 PM  Result Value Ref Range   Strep Pneumo Urinary Antigen NEGATIVE NEGATIVE    Comment:        Infection due to S. pneumoniae cannot be absolutely ruled out since the antigen present may be below the detection limit of the test. Performed at [facility], [address] [place], [place] [address]   Troponin T, High Sensitivity     Status: Abnormal   Collection Time: 03/29/24  6:35 PM  Result Value Ref Range   Troponin T High Sensitivity 28 (H) 0 - 19 ng/L    Comment: (NOTE) Biotin concentrations > 1000 ng/mL falsely decrease TnT results.  Serial cardiac troponin measurements are suggested.  Refer to the Links section for chest pain algorithms and additional  guidance. Performed at [facility], [address], [place], [place] [address]   Procalcitonin     Status: None   Collection Time: 03/29/24  6:35 PM  Result Value Ref Range   Procalcitonin 0.76 ng/mL    Comment: (NOTE)   Sepsis PCT Algorithm          Lower Respiratory Tract Infection                                         PCT Algorithm -----------------------------------------------------------------  <0.5 ng/mL                    <0.10 ng/mL  Associated with low           Antibiotic therapy strongly   risk for progression          discouraged. Indicates absence   to severe sepsis              of bacteria infection  and/or septic shock             --------------------------------------------------------------  0.5-2.0 ng/mL                 0.10-0.25 ng/mL  Recommended to retest          Antibiotic therapy discouraged.  PCT within 6-24 hours         Bacterial infection unlikely  ------------------------------------------------------------  >2 ng/mL                      0.26-0.50 ng/mL  Associated with high risk     Antibiotic therapy encouraged.  for progression to severe     Bacterial infection possible  sepsis/and or septic shock    ------------------------------                                 >0.50 ng/mL                                Antibiotic therapy strongly  encouraged.                                Suggestive of presence of                                 bacterial infection.                                 -------------------------------------------------------------------  < or = 0.50 ng/mL OR          < or = 0.25 OR 80% decrease in PCT  80% decrease in PCT           Antibiotic therapy   Antibiotic therapy may        may be discontinued  be discontinued                                 Performed at [facility], [address], [place], [place] [address]   TSH     Status: None   Collection Time: 03/29/24  6:35 PM  Result Value Ref Range   TSH 4.090 0.350 - 4.500 uIU/mL    Comment: Performed at [facility], [address], [place], [place] [address]  Protime-INR     Status: Abnormal   Collection Time: 03/29/24  6:35 PM  Result Value Ref Range   Prothrombin Time 16.1 (H) 11.4 - 15.2 seconds   INR 1.2 0.8 - 1.2    Comment: (NOTE) INR goal varies based on device and disease states. Performed at [facility], [address] [place], [place] [address]   T4, free     Status: Abnormal   Collection Time: 03/29/24  6:35 PM  Result Value Ref Range   Free T4 1.39 (H) 0.61 - 1.12 ng/dL    Comment: (NOTE) Biotin ingestion may interfere with free T4 tests. If the results are inconsistent with the TSH level, previous test results, or the clinical presentation, then  consider biotin interference. If needed, order repeat testing after stopping biotin. Performed at [facility], [address] [place], [place] [address]   Vitamin B12     Status: None   Collection Time: 03/29/24  6:35 PM  Result Value Ref Range   Vitamin B-12 231 180 - 914 pg/mL    Comment: Performed at [facility], [address], [place], [place] [address]  Magnesium      Status: None   Collection Time: 03/29/24  6:35 PM  Result Value Ref Range   Magnesium  2.0 1.7 - 2.4 mg/dL    Comment: Performed at [facility], [address], [place], [place] [address]  Procalcitonin     Status: None   Collection Time: 03/30/24  4:26 AM  Result Value Ref Range   Procalcitonin 0.74 ng/mL    Comment: (NOTE)   Sepsis PCT Algorithm          Lower Respiratory Tract Infection                                         PCT Algorithm -----------------------------------------------------------------  <0.5 ng/mL                    <  0.10 ng/mL  Associated with low           Antibiotic therapy strongly   risk for progression          discouraged. Indicates absence   to severe sepsis              of bacteria infection  and/or septic shock             --------------------------------------------------------------  0.5-2.0 ng/mL                 0.10-0.25 ng/mL  Recommended to retest         Antibiotic therapy discouraged.  PCT within 6-24 hours         Bacterial infection unlikely  ------------------------------------------------------------  >2 ng/mL                      0.26-0.50 ng/mL  Associated with high risk     Antibiotic therapy encouraged.  for progression to severe     Bacterial infection possible  sepsis/and or septic shock    ------------------------------                                 >0.50 ng/mL                                Antibiotic therapy strongly                                 encouraged.                                Suggestive of  presence of                                 bacterial infection.                                 -------------------------------------------------------------------  < or = 0.50 ng/mL OR          < or = 0.25 OR 80% decrease in PCT  80% decrease in PCT           Antibiotic therapy   Antibiotic therapy may        may be discontinued  be discontinued                                 Performed at [facility], [address] [place], [place] [address]   Basic metabolic panel     Status: Abnormal   Collection Time: 03/30/24  4:26 AM  Result Value Ref Range   Sodium 138 135 - 145 mmol/L   Potassium 3.5 3.5 - 5.1 mmol/L   Chloride 104 98 - 111 mmol/L   CO2 22 22 - 32 mmol/L   Glucose, Bld 121 (H) 70 - 99 mg/dL    Comment: Glucose reference range applies only to samples taken after fasting for at least 8 hours.   BUN 10 8 - 23 mg/dL   Creatinine, Ser 9.34 0.44 - 1.00 mg/dL  Calcium  9.6 8.9 - 10.3 mg/dL   GFR, Estimated >39 >39 mL/min    Comment: (NOTE) Calculated using the CKD-EPI Creatinine Equation (2021)    Anion gap 13 5 - 15    Comment: Performed at [facility], [address] [place], [place] [address]  CBC     Status: Abnormal   Collection Time: 03/30/24  4:26 AM  Result Value Ref Range   WBC 24.0 (H) 4.0 - 10.5 K/uL   RBC 4.38 3.87 - 5.11 MIL/uL   Hemoglobin 12.6 12.0 - 15.0 g/dL   HCT 60.1 63.9 - 53.9 %   MCV 90.9 80.0 - 100.0 fL   MCH 28.8 26.0 - 34.0 pg   MCHC 31.7 30.0 - 36.0 g/dL   RDW 86.7 88.4 - 84.4 %   Platelets 425 (H) 150 - 400 K/uL   nRBC 0.0 0.0 - 0.2 %    Comment: Performed at [facility], [address], [place], [place] [address]  MRSA Next Gen by PCR, Nasal     Status: None   Collection Time: 03/30/24  1:01 PM   Specimen: Nasal Mucosa; Nasal Swab  Result Value Ref Range   MRSA by PCR Next Gen NOT DETECTED NOT DETECTED    Comment: (NOTE) The GeneXpert MRSA Assay (FDA approved for NASAL  specimens only), is one component of a comprehensive MRSA colonization surveillance program. It is not intended to diagnose MRSA infection nor to guide or monitor treatment for MRSA infections. Test performance is not FDA approved in patients less than 31 years old. Performed at [facility], [address], [place], [place] [address]   Basic metabolic panel with GFR     Status: Abnormal   Collection Time: 03/31/24  4:14 AM  Result Value Ref Range   Sodium 136 135 - 145 mmol/L   Potassium 3.5 3.5 - 5.1 mmol/L   Chloride 103 98 - 111 mmol/L   CO2 19 (L) 22 - 32 mmol/L   Glucose, Bld 108 (H) 70 - 99 mg/dL    Comment: Glucose reference range applies only to samples taken after fasting for at least 8 hours.   BUN 15 8 - 23 mg/dL   Creatinine, Ser 9.34 0.44 - 1.00 mg/dL   Calcium  9.5 8.9 - 10.3 mg/dL   GFR, Estimated >39 >39 mL/min    Comment: (NOTE) Calculated using the CKD-EPI Creatinine Equation (2021)    Anion gap 14 5 - 15    Comment: Performed at [facility], [address], [place], [place] [address]  CBC     Status: Abnormal   Collection Time: 03/31/24  4:14 AM  Result Value Ref Range   WBC 23.0 (H) 4.0 - 10.5 K/uL   RBC 3.95 3.87 - 5.11 MIL/uL   Hemoglobin 11.9 (L) 12.0 - 15.0 g/dL   HCT 64.0 (L) 63.9 - 53.9 %   MCV 90.9 80.0 - 100.0 fL   MCH 30.1 26.0 - 34.0 pg   MCHC 33.1 30.0 - 36.0 g/dL   RDW 86.6 88.4 - 84.4 %   Platelets 413 (H) 150 - 400 K/uL   nRBC 0.0 0.0 - 0.2 %    Comment: Performed at [facility], [address] [place], [place] [address]   *Note: Due to a large number of results and/or encounters for the requested time period, some results have not been displayed. A complete set of results can be found in Results Review.  DG Abd 1 View Result Date: 03/31/2024 CLINICAL DATA:  Nausea and vomiting. EXAM: ABDOMEN - 1 VIEW COMPARISON:  10/06/2022, CT 03/29/2024 FINDINGS: Interval change in bowel gas  pattern with several air-filled dilated small bowel loops present. There are several air-filled prominent bowel loops in the central abdomen likely representing small bowel loops. There is air and stool over the rectum. No definite free peritoneal air. Remainder of the exam is unchanged. IMPRESSION: Interval change in bowel gas pattern with several air-filled dilated small bowel loops as well as several air-filled prominent bowel loops in the central abdomen likely representing small bowel loops. Findings likely due to early/partial small bowel obstruction and less likely ileus. Electronically Signed   By: Toribio Agreste M.D.   On: 03/31/2024 13:27   CT CHEST ABDOMEN PELVIS W CONTRAST Result Date: 03/29/2024 CLINICAL DATA:  Pleural effusion, malignancy suspected. Pneumonia. * Tracking Code: BO * EXAM: CT CHEST, ABDOMEN, AND PELVIS WITH CONTRAST TECHNIQUE: Multidetector CT imaging of the chest, abdomen and pelvis was performed following the standard protocol during bolus administration of intravenous contrast. RADIATION DOSE REDUCTION: This exam was performed according to the departmental dose-optimization program which includes automated exposure control, adjustment of the mA and/or kV according to patient size and/or use of iterative reconstruction technique. CONTRAST:  OMNIPAQUE  IOHEXOL  300 MG/ML  SOLN COMPARISON:  CT chest 12/27/2023, CT abdomen pelvis 10/09/2023, PET 02/26/2023. FINDINGS: CT CHEST FINDINGS Cardiovascular: Atherosclerotic calcification of the aorta and coronary arteries. Heart size normal. No pericardial effusion. Left ventricle appears hypertrophied. Small pericardial effusion. Mediastinum/Nodes: No pathologically enlarged mediastinal, hilar or axillary lymph nodes. Thoracic inlet lymph nodes are not enlarged by CT size criteria. 10 mm prepericardiac or left juxtadiaphragmatic lymph node (6/54), new. Partial right thyroidectomy. Esophagus is grossly unremarkable. Lungs/Pleura: Large  highly loculated left pleural effusion with compressive atelectasis in the left lung. There is central low-attenuation in the left lower lobe (6/48). Trace right pleural effusion. Airway is unremarkable. Musculoskeletal: Degenerative changes in the spine. No worrisome lytic or sclerotic lesions. Mild compression of the T6 superior endplate, old. CT ABDOMEN PELVIS FINDINGS Hepatobiliary: Liver and gallbladder are unremarkable. No biliary ductal dilatation. Pancreas: Negative. Spleen: Negative. Adrenals/Urinary Tract: Adrenal glands and right kidney are unremarkable. Small low-attenuation lesion in the left kidney, too small to characterize. No specific follow-up necessary. Ureters are decompressed. Bladder is grossly unremarkable. Stomach/Bowel: Stomach and small bowel are unremarkable. Appendix is not readily visualized. Moderate stool burden. Rectal prolapse. Vascular/Lymphatic: Atherosclerotic calcification of the aorta. No pathologically enlarged lymph nodes. Reproductive: Hysterectomy.  No adnexal mass. Other: No free fluid. Musculoskeletal: Degenerative changes in the spine. No worrisome lytic or sclerotic lesions. Grade 1 anterolisthesis of L4 on L5. IMPRESSION: 1. Large highly located left pleural effusion with compressive atelectasis throughout the left lung. 2. Central low-attenuation in the left lower lobe may be due to radiation scarring. Recommend attention on follow-up. 3. Small pericardial effusion and trace right pleural effusion. 4. Moderate stool burden.  Rectal prolapse. 5. Aortic atherosclerosis (ICD10-I70.0). Coronary artery calcification. Electronically Signed   By: Newell Eke M.D.   On: 03/29/2024 15:15      Assessment/Plan Nausea, vomiting, with likely SBO The patient has been seen, examined, labs, vitals, chart, and imaging personally reviewed.  Her original CT scan does not show any intra-abdominal pathology.  However, her symptoms started yesterday about 24 hrs after this scan.   Her new films today show some significantly distended, likely small bowel, which is concerning for an obstructive process vs ileus.  However, the patient has had multiple previous SBOs and surgeries so she is at risk for recurrent SBOs.  We will go ahead and place an NGT for decompression and then proceed with a repeat CT scan to better evaluate the process to rule out any acute surgical needs vs a straight-forward SBO type picture that we can try conservative management on.  Her WBC is around 23K.  This is likely felt to be secondary to her pulmonary process, but will monitor her new CT scan as well.  This has all been discussed with the patient and her daughter who is at bedside, as well as the RN who is present in the room with us .  All agree with this plan.   FEN - NPO/NGT/IVFs per [facility] VTE - Plavix  on hold, lovenox  ID - Vanc, maxipime   HTN HLD h/o CVA Alzheimer's disease (although oriented currently) Stage III adenocarcinoma of lung GERD hypothyroidism  I reviewed Consultant pulmonary notes, hospitalist notes, last 24 h vitals and pain scores, last 48 h intake and output, last 24 h labs and trends, and last 24 h imaging results.  Burnard FORBES Banter, [facility] 03/31/2024, 2:10 PM Please see Amion for pager number during day hours 7:00am-4:30pm or 7:00am -11:30am on weekends

## 2024-03-31 NOTE — Care Plan (Signed)
 Having stomach discomfort and vomited 11/11- xray abd concerning for SBO- Surgery consulted- [facility] has placed NG  tube, started ivf.

## 2024-03-31 NOTE — Plan of Care (Signed)

## 2024-03-31 NOTE — Progress Notes (Signed)
 PROGRESS NOTE Angelica [name]  FMW:[id number] DOB: 01/26/1941 DOA: 03/29/2024 PCP: Norleen Lynwood ORN, MD  Brief Narrative/Hospital Course: Angelica [name] is a 83 y.o. female with PMH of GERD, HTN, HLD, IDA, asthma, stage 3b adenocarcinoma of the lung, hypothyroidism, hx of CVA, Alzheimer's, hx of SBO due to adhesions in 2016 and 2021 who presented to ED with complaints of weakness and  5 days of c/o w/ breast being heavy or swollen.  Patient had difficulty working at [employer] and also had chest.  At baseline she can bathe and dress on her own. She feeds herself. Can do all ADLs, but cook.  In  E:  vitals: afebrile, bp: 125/75, HR: 91, RR: 18, oxygen: 98%RA wbc: 21.8, platelets 418, lactic acid: 2.1, troponin 27 CXR: Near-complete opacification of the left lung, differential includes large malignant pleural effusion, central hilar mass with obstruction, lobar atelectasis, and/or pneumonia.  CT head: no acute finding CT abdomen/pelvis: Large highly located left pleural effusion with compressive atelectasis throughout the left lung. Central low-attenuation in the left lower lobe may be due to radiation scarring.Small pericardial effusion and trace right pleural effusion. Moderate stool burden.  Rectal prolapse Patient was placed on IV antibiotics, pulmonary consulted and admitted for further management  Subjective: Seen and examined today Having nausea coccidia stoma No chest pain shortness of breath.  She is on room air Daughter and family at the bedside Overnight afebrile on room air and labs shows WBC slightly better 23 stable renal function bicarb 19  Assessment and plan:  Large highly loculated left pleural effusion History of ACC of the lung: Differential includes pulm infection versus recurrent malignancy and sepsis. continue empiric antibiotics vancomycin  and cefepime . PCCM following planning for plan for chest tube placement 11/20 after Plavix  hold Blood culture NGTD.  Remains on  room air  Generalized weakness/debility: Cont PT OT eval,keep on fall precaution  Adenocarcinoma of left lung, stage 3: diagnosed in 05/2020, on observation currently S/p concurrent chemoradiation with weekly carboplatin  for AUC of 2 and paclitaxel  45 mg/M2. S/p 5 cycles- Last August 01, 2020. S/p Consolidation treatment with immunotherapy with Imfinzi  1500 mg IV q 4 wk x 3 cycles- discontinued 2/2 intolerance/sepsis. Given left pleural effusion-pulmonary consulted see above   Elevated troponin: Flat and mildly elevated troponin 27>28, suspect demand ischemia due to #1   HTN  Controlled on amlodipine    History of stroke?  HLD Continue statin, allergy to ASA, on Plavix  PTA. Per her neuro note October 30 2023- She had an MRI scan of the brain done on 07/18/2021 which I personally reviewed and shows only mild changes of small vessel disease and generalized atrophy. I do not see a definite right cerebellar stroke but the radiologist has called 1 which apparently was not seen on previous MRI from 06/14/2020. Patient has since been started on Plavix  by Dr. Norleen  Neuro DrSethi did not feel it was CVA. Given need to hold plavix  for procedure per PCCm we will hold off .   Hypothyroidism Euthyroid TSH 4.0,continue synthroid  50mcg daily    Asthma, severe persistent: No signs of exacerbation, continue bronchodilators   Alzheimer's disease At baseline, Continue namenda . Keep on fall, delirium precautions    GERD: Continue protonix  daily    Nausea Constipation Noted stool burden on CT. Cont scheduled colace daily, added prn miralax .  Last BM 11/9.  Continue PPI twice daily, continue antiemetics  CODE STATUS/goals of care: Discussed with patient and daughter and she is a full code  Mobility: PT Orders: Active  PT Follow up Rec: Home Health Pt11/02/2024 1259   DVT prophylaxis: enoxaparin  (LOVENOX ) injection 40 mg Start: 03/30/24 2200 SCDs Start: 03/29/24 1721 Code Status:   Code Status:  Full Code Family Communication: plan of care discussed with patient/daughter at bedside. Patient status is: Remains hospitalized because of severity of illness Level of care: Telemetry   Dispo: The patient is from: home            Anticipated disposition: TBD Objective: Vitals last 24 hrs: Vitals:   03/30/24 2324 03/31/24 0009 03/31/24 0441 03/31/24 0853  BP: (!) 153/87 (!) 152/58 (!) 159/85   Pulse: 94 64 93   Resp: 16 17 15    Temp: 98 F (36.7 C) 98 F (36.7 C) 98.1 F (36.7 C)   TempSrc: Oral Oral Oral   SpO2: 98% 91% 96% 94%  Weight:      Height:        Physical Examination: General exam: AAO, ill looking HEENT:Oral mucosa moist, Ear/Nose WNL grossly Respiratory system: Bilaterally diminished at base Cardiovascular system: S1 & S2 +, No JVD. Gastrointestinal system: Abdomen soft,NT,ND, BS+ Nervous System: Alert, awake, moving all extremities,and following commands. Extremities: extremities warm, leg edema  NEG Skin: Warm, no rashes MSK: Normal muscle bulk,tone, power   Medications reviewed:  Scheduled Meds:  amLODipine   10 mg Oral Daily   docusate sodium   100 mg Oral Daily   enoxaparin  (LOVENOX ) injection  40 mg Subcutaneous Q24H   fluticasone   1 spray Each Nare Daily   fluticasone  furoate-vilanterol  1 puff Inhalation Daily   ipratropium  2 spray Each Nare TID   levothyroxine   50 mcg Oral Q0600   loratadine   10 mg Oral Daily   memantine   10 mg Oral BID   montelukast   10 mg Oral QHS   pantoprazole   40 mg Oral Daily   pravastatin   80 mg Oral Daily   vitamin B-12  500 mcg Oral Daily   Continuous Infusions:  ceFEPime  (MAXIPIME ) IV 2 g (03/31/24 0445)   vancomycin  750 mg (03/30/24 2045)   Diet: Diet Order             Diet regular Room service appropriate? Yes; Fluid consistency: Thin  Diet effective now                    Data Reviewed: I have personally reviewed following labs and imaging studies ( see epic result tab) CBC: Recent Labs  Lab  03/29/24 0926 03/30/24 0426 03/31/24 0414  WBC 21.8* 24.0* 23.0*  HGB 12.6 12.6 11.9*  HCT 37.6 39.8 35.9*  MCV 90.8 90.9 90.9  PLT 418* 425* 413*   CMP: Recent Labs  Lab 03/29/24 0926 03/29/24 1835 03/30/24 0426 03/31/24 0414  NA 140  --  138 136  K 3.9  --  3.5 3.5  CL 104  --  104 103  CO2 24  --  22 19*  GLUCOSE 131*  --  121* 108*  BUN 11  --  10 15  CREATININE 0.77  --  0.65 0.65  CALCIUM  9.0  --  9.6 9.5  MG  --  2.0  --   --    GFR: Estimated Creatinine Clearance: 47.1 mL/min (by C-G formula based on SCr of 0.65 mg/dL). Recent Labs  Lab 03/29/24 0926  AST 19  ALT 10  ALKPHOS 77  BILITOT 0.7  PROT 7.4  ALBUMIN  3.2*   No results for input(s): LIPASE, AMYLASE in the  last 168 hours. No results for input(s): AMMONIA in the last 168 hours. Coagulation Profile:  Recent Labs  Lab 03/29/24 1835  INR 1.2   Unresulted Labs (From admission, onward)     Start     Ordered   04/06/24 0500  Creatinine, serum  (enoxaparin  (LOVENOX )    CrCl >/= 30 ml/min)  Weekly,   R     Comments: while on enoxaparin  therapy   Question:  Specimen collection method  Answer:  Lab=Lab collect   03/30/24 1505   03/31/24 0500  Basic metabolic panel with GFR  Daily,   R      03/30/24 0745   03/31/24 0500  CBC  Daily,   R      03/30/24 0745   03/29/24 1723  Legionella Pneumophila Serogp 1 Ur Ag  Once,   R        03/29/24 1722   03/29/24 1612  Expectorated Sputum Assessment w Gram Stain, Rflx to Resp Cult  Once,   R        03/29/24 1611           Antimicrobials/Microbiology: Anti-infectives (From admission, onward)    Start     Dose/Rate Route Frequency Ordered Stop   03/30/24 2200  vancomycin  (VANCOREADY) IVPB 750 mg/150 mL        750 mg 150 mL/hr over 60 Minutes Intravenous Every 24 hours 03/29/24 1620     03/30/24 0400  ceFEPIme  (MAXIPIME ) 2 g in sodium chloride  0.9 % 100 mL IVPB        2 g 200 mL/hr over 30 Minutes Intravenous Every 12 hours 03/29/24 1620      03/29/24 1530  vancomycin  (VANCOCIN ) IVPB 1000 mg/200 mL premix        1,000 mg 200 mL/hr over 60 Minutes Intravenous  Once 03/29/24 1517 03/30/24 0228   03/29/24 1530  ceFEPIme  (MAXIPIME ) 2 g in sodium chloride  0.9 % 100 mL IVPB        2 g 200 mL/hr over 30 Minutes Intravenous  Once 03/29/24 1518 03/29/24 1724         Component Value Date/Time   SDES  03/29/2024 1147    BLOOD BLOOD LEFT ARM Performed at [facility], [address], [place], [place] [address]    SPECREQUEST  03/29/2024 1147    BOTTLES DRAWN AEROBIC AND ANAEROBIC Blood Culture results may not be optimal due to an inadequate volume of blood received in culture bottles Performed at [facility], [address], [place], [place] [address]    CULT  03/29/2024 1147    NO GROWTH 2 DAYS Performed at [facility], [address], [place], [place] [address]    REPTSTATUS PENDING 03/29/2024 1147    Procedures:    Mennie LAMY, MD [facility] 03/31/2024, 9:33 AM

## 2024-04-01 ENCOUNTER — Inpatient Hospital Stay (HOSPITAL_COMMUNITY)

## 2024-04-01 ENCOUNTER — Encounter (HOSPITAL_COMMUNITY)
Admission: EM | Disposition: A | Discharge status: Skilled Nursing Facility | Payer: Self-pay | Source: Home / Self Care | Attending: Internal Medicine

## 2024-04-01 DIAGNOSIS — J189 Pneumonia, unspecified organism: Secondary | ICD-10-CM | POA: Diagnosis not present

## 2024-04-01 DIAGNOSIS — K56609 Unspecified intestinal obstruction, unspecified as to partial versus complete obstruction: Secondary | ICD-10-CM | POA: Diagnosis not present

## 2024-04-01 DIAGNOSIS — C3492 Malignant neoplasm of unspecified part of left bronchus or lung: Secondary | ICD-10-CM | POA: Diagnosis not present

## 2024-04-01 DIAGNOSIS — J918 Pleural effusion in other conditions classified elsewhere: Secondary | ICD-10-CM | POA: Diagnosis not present

## 2024-04-01 DIAGNOSIS — J910 Malignant pleural effusion: Secondary | ICD-10-CM | POA: Diagnosis not present

## 2024-04-01 HISTORY — PX: CHEST TUBE INSERTION: SHX231

## 2024-04-01 LAB — BASIC METABOLIC PANEL WITH GFR
Anion gap: 15 (ref 5–15)
BUN: 17 mg/dL (ref 8–23)
CO2: 19 mmol/L — ABNORMAL LOW (ref 22–32)
Calcium: 9.6 mg/dL (ref 8.9–10.3)
Chloride: 103 mmol/L (ref 98–111)
Creatinine, Ser: 0.63 mg/dL (ref 0.44–1.00)
GFR, Estimated: >60 mL/min (ref >60)
Glucose, Bld: 93 mg/dL (ref 70–99)
Potassium: 3.2 mmol/L — ABNORMAL LOW (ref 3.5–5.1)
Sodium: 137 mmol/L (ref 135–145)

## 2024-04-01 LAB — BODY FLUID CELL COUNT WITH DIFFERENTIAL
Eos, Fluid: 0 %
Lymphs, Fluid: 9 %
Monocyte-Macrophage-Serous Fluid: 1 % — ABNORMAL LOW (ref 50–90)
Neutrophil Count, Fluid: 90 % — ABNORMAL HIGH (ref 0–25)
Total Nucleated Cell Count, Fluid: 2627 uL — ABNORMAL HIGH (ref 0–1000)

## 2024-04-01 LAB — CBC
HCT: 35 % — ABNORMAL LOW (ref 36.0–46.0)
Hemoglobin: 11.6 g/dL — ABNORMAL LOW (ref 12.0–15.0)
MCH: 30 pg (ref 26.0–34.0)
MCHC: 33.1 g/dL (ref 30.0–36.0)
MCV: 90.4 fL (ref 80.0–100.0)
Platelets: 425 K/uL — ABNORMAL HIGH (ref 150–400)
RBC: 3.87 MIL/uL (ref 3.87–5.11)
RDW: 13.5 % (ref 11.5–15.5)
WBC: 18.6 K/uL — ABNORMAL HIGH (ref 4.0–10.5)
nRBC: 0 % (ref 0.0–0.2)

## 2024-04-01 LAB — GLUCOSE, PLEURAL OR PERITONEAL FLUID: Glucose, Fluid: <20 mg/dL

## 2024-04-01 LAB — LEGIONELLA PNEUMOPHILA SEROGP 1 UR AG: L. pneumophila Serogp 1 Ur Ag: NEGATIVE

## 2024-04-01 LAB — MAGNESIUM: Magnesium: 2.2 mg/dL (ref 1.7–2.4)

## 2024-04-01 LAB — PROTEIN, PLEURAL OR PERITONEAL FLUID: Total protein, fluid: 4.5 g/dL

## 2024-04-01 LAB — LACTATE DEHYDROGENASE, PLEURAL OR PERITONEAL FLUID: LD, Fluid: 454 U/L — ABNORMAL HIGH (ref 3–23)

## 2024-04-01 SURGERY — CHEST TUBE INSERTION
Anesthesia: LOCAL | Laterality: Left

## 2024-04-01 MED ORDER — SODIUM CHLORIDE 0.9% FLUSH
10.0000 mL | Freq: Three times a day (TID) | INTRAVENOUS | Status: DC
  Administered 2024-04-01 – 2024-04-03 (×7): 10 mL via INTRAPLEURAL

## 2024-04-01 MED ORDER — PANTOPRAZOLE SODIUM 40 MG IV SOLR
40.0000 mg | Freq: Two times a day (BID) | INTRAVENOUS | Status: DC
  Administered 2024-04-01 – 2024-04-15 (×29): 40 mg via INTRAVENOUS
  Filled 2024-04-01 (×29): qty 10

## 2024-04-01 MED ORDER — DIATRIZOATE MEGLUMINE & SODIUM 66-10 % PO SOLN
90.0000 mL | Freq: Once | ORAL | Status: AC
  Administered 2024-04-01: 90 mL via NASOGASTRIC
  Filled 2024-04-01: qty 90

## 2024-04-01 MED ORDER — STERILE WATER FOR INJECTION IJ SOLN
5.0000 mg | Freq: Once | RESPIRATORY_TRACT | Status: AC
  Administered 2024-04-01: 5 mg via INTRAPLEURAL
  Filled 2024-04-01: qty 5

## 2024-04-01 MED ORDER — MORPHINE SULFATE (PF) 2 MG/ML IV SOLN
2.0000 mg | INTRAVENOUS | Status: DC | PRN
  Administered 2024-04-01 – 2024-04-03 (×7): 2 mg via INTRAVENOUS
  Filled 2024-04-01 (×7): qty 1

## 2024-04-01 MED ORDER — FENTANYL CITRATE (PF) 100 MCG/2ML IJ SOLN
INTRAMUSCULAR | Status: DC | PRN
  Administered 2024-04-01: 12.5 ug via INTRAVENOUS

## 2024-04-01 MED ORDER — MIDAZOLAM HCL (PF) 2 MG/2ML IJ SOLN
INTRAMUSCULAR | Status: DC | PRN
  Administered 2024-04-01: .5 mg via INTRAVENOUS

## 2024-04-01 MED ORDER — LEVOTHYROXINE SODIUM 100 MCG/5ML IV SOLN
37.5000 ug | Freq: Every day | INTRAVENOUS | Status: DC
  Administered 2024-04-08 – 2024-04-10 (×3): 37.5 ug via INTRAVENOUS
  Filled 2024-04-01 (×3): qty 5

## 2024-04-01 MED ORDER — POTASSIUM CHLORIDE 10 MEQ/100ML IV SOLN
10.0000 meq | INTRAVENOUS | Status: AC
Start: 2024-04-01 — End: 2024-04-01
  Administered 2024-04-01 (×2): 10 meq via INTRAVENOUS
  Filled 2024-04-01 (×4): qty 100

## 2024-04-01 MED ORDER — HYDRALAZINE HCL 20 MG/ML IJ SOLN
5.0000 mg | INTRAMUSCULAR | Status: DC | PRN
  Filled 2024-04-01: qty 1

## 2024-04-01 MED ORDER — MIDAZOLAM HCL 2 MG/2ML IJ SOLN
INTRAMUSCULAR | Status: AC
  Filled 2024-04-01: qty 2

## 2024-04-01 MED ORDER — SODIUM CHLORIDE (PF) 0.9 % IJ SOLN
10.0000 mg | Freq: Once | INTRAMUSCULAR | Status: AC
  Administered 2024-04-01: 10 mg via INTRAPLEURAL
  Filled 2024-04-01: qty 10

## 2024-04-01 MED ORDER — DOCUSATE SODIUM 50 MG/5ML PO LIQD
100.0000 mg | Freq: Every day | ORAL | Status: DC
  Filled 2024-04-01 (×3): qty 10

## 2024-04-01 MED ORDER — POTASSIUM CHLORIDE 10 MEQ/100ML IV SOLN
10.0000 meq | INTRAVENOUS | Status: AC
  Administered 2024-04-01 (×2): 10 meq via INTRAVENOUS

## 2024-04-01 MED ORDER — FENTANYL CITRATE (PF) 100 MCG/2ML IJ SOLN
INTRAMUSCULAR | Status: AC
  Filled 2024-04-01: qty 2

## 2024-04-01 NOTE — Progress Notes (Signed)
 NAME:  Angelica [name], MRN:  [id number], DOB:  August 25, 1940, LOS: 3 ADMISSION DATE:  03/29/2024, CONSULTATION DATE:  04/01/24 REFERRING MD:  Dr. Christobal, CHIEF COMPLAINT:  Pleural Effusion   History of Present Illness:   Angelica [name] is an 83 y/o F with a PMH significant for GERD, HTN, HLD, stage 3B NSCLC, hypothyroidism, Alzheimer's dementia who presented for weakness and shortness of breath now found to have a large loculated left pleural effusion. Pulmonology consulted for evaluation and management of the latter.  History mostly obtained from discussion with daughter and chart review. Per daughter, Angelica [name] developing worsening shortness of breath to the point she was unable to walk without getting really winded. She also noted some chest pressure/pain. Daughter called 911. The Angelica [name] was brought into the ED and CT C/A/P showed a large left sided loculated pleural effusion.   Of note, Angelica [name] previously had thoracentesis on left side 10/07/2022. Cytology negative for malignancy at that time. Lymphocytic effusion. LDH low at 71. Protein 3.2.  Pertinent  Medical History   Past Medical History:  Diagnosis Date   Allergy 05/21/1978   Arthritis    Asthma    Bowel obstruction (HCC)    Cancer (HCC)    Cataract    Environmental allergies    GERD (gastroesophageal reflux disease)    Glaucoma 05/09/2017   H. pylori infection    History of radiation therapy 07/05/20-08/05/20   Left lung IMRT Dr. Shannon    HLD (hyperlipidemia) 01/16/2019   Hypertension    Iron  deficiency anemia    Osteopenia 05/26/2017   Thyroid  disease    Urticaria 07/25/2018   Significant Hospital Events: Including procedures, antibiotic start and stop dates in addition to other pertinent events   Angelica [name] admitted to [facility], plavix  dose given on 11/10, discontinued plavix  order. 03/31/2024, diagnosed with SBO, placed NGT to LIS, CT ABD/PLV shows SBO with proximal transition point beyond ligament of Treitz. CT re-demonstrates left  pleural effusion.  Interim History / Subjective:  Feeling well. No dyspnea. She remains on RA. Has NGT on LIS. Abdominal pain well controlled at this time.  Objective    Blood pressure (!) 144/82, pulse 95, temperature 98.4 F (36.9 C), temperature source Oral, resp. rate 17, height 5' 5 (1.651 m), weight 59.6 kg, SpO2 99%.        Intake/Output Summary (Last 24 hours) at 04/01/2024 1040 Last data filed at 04/01/2024 0300 Gross per 24 hour  Intake 529.69 ml  Output 400 ml  Net 129.69 ml   Filed Weights   03/29/24 1600 03/29/24 2345 03/30/24 0626  Weight: 59 kg 59.6 kg 59.6 kg    Examination: General: NAD, alert, WD, WN Eyes: PERRL, no scleral icterus ENMT: oropharynx clear, good dentition, no oral lesions, mallampati score I Skin: warm, intact, no rashes Neck: JVD flat, ROM and lymph node assessment normal CV: RRR, no MRG, nl S1 and S2, no peripheral edema Resp: decreased breath sounds in left mid and lower lung field anteriorly and posteriorly Abdom: Normoactive bowel sounds, soft, nontender, nondistended, no hepatosplenomegaly, NGT draining bilious material. Neuro: Awake alert follows commands.   Resolved problem list   Assessment and Plan   #Large Loculated Pleural Effusion: > Unclear etiology. Ddx includes infectious etiology versus malignant. - Last plavix  dose on 11/10 which is around 48 hours ago. The Angelica [name] has persistent leukocytosis and effusion appears to be loculated and highly suspicious for infectious process. Therefore, will plan to do chest tube placement today. I discussed risks and benefits  with both Angelica [name]'s daughter including the added risk associated with Plavix  - I discussed timing of chest tube with nursing staff so they can coordinate abdominal imaging ordered by general surgery - Once chest tube is placed, will send off pleural fluid for cell count, chemistries, cytology, and microbiology - Agree with antimicrobials per primary medical  team  #Hx of CVA: - Currently on DAPT, stroke occurred in 2023 - Hold Plavix  for pleural intervention as noted above.  #SBO: - Plan per surgery. - Continue NGT to LIS  Labs   CBC: Recent Labs  Lab 03/29/24 0926 03/30/24 0426 03/31/24 0414 04/01/24 0404  WBC 21.8* 24.0* 23.0* 18.6*  HGB 12.6 12.6 11.9* 11.6*  HCT 37.6 39.8 35.9* 35.0*  MCV 90.8 90.9 90.9 90.4  PLT 418* 425* 413* 425*    Basic Metabolic Panel: Recent Labs  Lab 03/29/24 0926 03/29/24 1835 03/30/24 0426 03/31/24 0414 04/01/24 0404  NA 140  --  138 136 137  K 3.9  --  3.5 3.5 3.2*  CL 104  --  104 103 103  CO2 24  --  22 19* 19*  GLUCOSE 131*  --  121* 108* 93  BUN 11  --  10 15 17   CREATININE 0.77  --  0.65 0.65 0.63  CALCIUM  9.0  --  9.6 9.5 9.6  MG  --  2.0  --   --  2.2   GFR: Estimated Creatinine Clearance: 47.1 mL/min (by C-G formula based on SCr of 0.63 mg/dL). Recent Labs  Lab 03/29/24 0926 03/29/24 1216 03/29/24 1835 03/30/24 0426 03/31/24 0414 04/01/24 0404  PROCALCITON  --   --  0.76 0.74  --   --   WBC 21.8*  --   --  24.0* 23.0* 18.6*  LATICACIDVEN  --  2.1*  --   --   --   --     Liver Function Tests: Recent Labs  Lab 03/29/24 0926  AST 19  ALT 10  ALKPHOS 77  BILITOT 0.7  PROT 7.4  ALBUMIN  3.2*   No results for input(s): LIPASE, AMYLASE in the last 168 hours. No results for input(s): AMMONIA in the last 168 hours.  ABG    Component Value Date/Time   TCO2 25 08/22/2016 1818     Coagulation Profile: Recent Labs  Lab 03/29/24 1835  INR 1.2    Cardiac Enzymes: No results for input(s): CKTOTAL, CKMB, CKMBINDEX, TROPONINI in the last 168 hours.  HbA1C: Hgb A1c MFr Bld  Date/Time Value Ref Range Status  07/18/2021 08:35 AM 5.8 4.6 - 6.5 % Final    Comment:    Glycemic Control Guidelines for People with Diabetes:Non Diabetic:  <6%Goal of Therapy: <7%Additional Action Suggested:  >8%   05/03/2020 05:07 PM 5.8 4.6 - 6.5 % Final    Comment:     Glycemic Control Guidelines for People with Diabetes:Non Diabetic:  <6%Goal of Therapy: <7%Additional Action Suggested:  >8%     CBG: Recent Labs  Lab 03/29/24 0958  GLUCAP 110*    Review of Systems:   Not obtained  Past Medical History:  She,  has a past medical history of Allergy (05/21/1978), Arthritis, Asthma, Bowel obstruction (HCC), Cancer (HCC), Cataract, Environmental allergies, GERD (gastroesophageal reflux disease), Glaucoma (05/09/2017), H. pylori infection, History of radiation therapy (07/05/20-08/05/20), HLD (hyperlipidemia) (01/16/2019), Hypertension, Iron  deficiency anemia, Osteopenia (05/26/2017), Thyroid  disease, and Urticaria (07/25/2018).   Surgical History:   Past Surgical History:  Procedure Laterality Date   ABDOMINAL HYSTERECTOMY  BOWEL RESECTION N/A 05/23/2020   Procedure: SMALL BOWEL REPAIR;  Surgeon: Kinsinger, Herlene Righter, MD;  Location: [facility] OR;  Service: General;  Laterality: N/A;   BREAST BIOPSY     BRONCHIAL BIOPSY  06/10/2020   Procedure: BRONCHIAL BIOPSIES;  Surgeon: Brenna Adine CROME, DO;  Location: [facility] ENDOSCOPY;  Service: Pulmonary;;   BRONCHIAL BRUSHINGS  06/10/2020   Procedure: BRONCHIAL BRUSHINGS;  Surgeon: Brenna Adine CROME, DO;  Location: [facility];  Service: Pulmonary;;   BRONCHIAL NEEDLE ASPIRATION BIOPSY  06/10/2020   Procedure: BRONCHIAL NEEDLE ASPIRATION BIOPSIES;  Surgeon: Brenna Adine CROME, DO;  Location: [facility];  Service: Pulmonary;;   BRONCHIAL WASHINGS  06/10/2020   Procedure: BRONCHIAL WASHINGS;  Surgeon: Brenna Adine CROME, DO;  Location: [facility];  Service: Pulmonary;;   COLON SURGERY     DIAGNOSTIC LARYNGOSCOPY N/A 05/23/2020   Procedure: ATTEMPTED DIAGNOSTIC LAPAROSCOPY WITH ADHESIONS;  Surgeon: Stevie, Herlene Righter, MD;  Location: [facility] OR;  Service: General;  Laterality: N/A;   EYE SURGERY     IR IMAGING GUIDED PORT INSERTION  07/15/2020   IR REMOVAL TUN ACCESS W/ PORT W/O FL MOD SED  01/16/2023   KNEE SURGERY      LAPAROTOMY N/A 04/18/2015   Procedure: Exploratory laparotomy with lysis of adhesions, possible bowel resection;  Surgeon: Lynda Leos, MD;  Location: [facility] OR;  Service: General;  Laterality: N/A;   LAPAROTOMY N/A 05/23/2020   Procedure: EXPLORATORY LAPAROTOMY;  Surgeon: Stevie Herlene Righter, MD;  Location: [facility] OR;  Service: General;  Laterality: N/A;   LYSIS OF ADHESION N/A 05/23/2020   Procedure: LYSIS OF ADHESION;  Surgeon: Stevie Herlene Righter, MD;  Location: [facility] OR;  Service: General;  Laterality: N/A;   NASAL SINUS SURGERY     SMALL INTESTINE SURGERY     THORACENTESIS Left 12/03/2022   Procedure: THORACENTESIS;  Surgeon: Brenna Adine CROME, DO;  Location: [facility];  Service: Pulmonary;  Laterality: Left;   THYROID  SURGERY     VIDEO BRONCHOSCOPY WITH ENDOBRONCHIAL NAVIGATION Bilateral 06/10/2020   Procedure: VIDEO BRONCHOSCOPY WITH ENDOBRONCHIAL NAVIGATION;  Surgeon: Brenna Adine CROME, DO;  Location: [facility];  Service: Pulmonary;  Laterality: Bilateral;   VIDEO BRONCHOSCOPY WITH ENDOBRONCHIAL ULTRASOUND  06/10/2020   Procedure: VIDEO BRONCHOSCOPY WITH ENDOBRONCHIAL ULTRASOUND;  Surgeon: Brenna Adine CROME, DO;  Location: [facility];  Service: Pulmonary;;     Social History:   reports that she quit smoking about 53 years ago. Her smoking use included cigarettes. She has been exposed to tobacco smoke. She has never used smokeless tobacco. She reports that she does not drink alcohol and does not use drugs.   Family History:  Her family history includes Diabetes in her mother; Glaucoma in her brother and sister; Hypertension in her father and mother. There is no history of Allergic rhinitis, Angioedema, Asthma, Eczema, Immunodeficiency, Urticaria, BRCA 1/2, or Breast cancer.   Allergies Allergies  Allergen Reactions   Asa Buff (Mag [Buffered Aspirin] Other (See Comments)    Excessive sweating   Ensure [Nutritional Supplements]     Or boost - upset stomach    Fish Allergy Other  (See Comments)    Unknown reaction, but allergic   Other Other (See Comments)    Gelcaps; gel-containing capsules; extended-release medication - upset stomach    Peanut-Containing Drug Products Other (See Comments)    From allergy test   Penicillins Itching   Shellfish Protein-Containing Drug Products Other (See Comments)    From allergy test   Strawberry Extract Nausea And Vomiting and  Other (See Comments)    From allergy test      Home Medications  Prior to Admission medications   Medication Sig Start Date End Date Taking? Authorizing Provider  acetaminophen  (TYLENOL ) 500 MG tablet Take 1,000 mg by mouth every 6 (six) hours as needed for moderate pain.   Yes [provider]  albuterol  (VENTOLIN  HFA) 108 (90 Base) MCG/ACT inhaler Inhale 2 puffs into the lungs every 4 (four) hours as needed for wheezing or shortness of breath. 01/03/24  Yes Padgett, Danita Macintosh, MD  alum & mag hydroxide-simeth (MAALOX/MYLANTA) 200-200-20 MG/5ML suspension Take 15 mLs by mouth every 6 (six) hours as needed for indigestion or heartburn.   Yes [provider]  amLODipine  (NORVASC ) 10 MG tablet Take 1 tablet (10 mg total) by mouth daily. 06/17/23  Yes Norleen Lynwood ORN, MD  benralizumab  (FASENRA ) 30 MG/ML prefilled syringe Inject 1 mL (30 mg total) into the skin every 8 (eight) weeks. 07/15/23  Yes Padgett, Danita Macintosh, MD  cetirizine  [facility] ALLERGY RELIEF, CETIRIZINE ,) 10 MG tablet Take 1 tablet (10 mg total) by mouth daily. 01/03/24  Yes Jeneal Danita Macintosh, MD  clopidogrel  (PLAVIX ) 75 MG tablet Take 1 tablet by mouth once daily 07/18/23  Yes Norleen Lynwood ORN, MD  diclofenac  Sodium (VOLTAREN ) 1 % GEL Apply 4 g topically 4 (four) times daily. Angelica [name] taking differently: Apply 4 g topically 4 (four) times daily as needed (pain). 04/23/22  Yes Corey, Evan S, MD  EPINEPHrine  (EPIPEN  2-PAK) 0.3 mg/0.3 mL IJ SOAJ injection Use as directed for severe allergic reaction 01/03/24  Yes Padgett, Danita Macintosh, MD  fluticasone  (FLONASE ) 50 MCG/ACT nasal spray USE 1 SPRAY(S) IN EACH NOSTRIL IN THE MORNING AND AT BEDTIME Angelica [name] taking differently: Place 1 spray into both nostrils daily. 01/14/24  Yes Padgett, Danita Macintosh, MD  ipratropium (ATROVENT ) 0.06 % nasal spray Place 2 sprays into both nostrils 3 (three) times daily as needed for rhinitis. 01/03/24  Yes Padgett, Danita Macintosh, MD  levothyroxine  (SYNTHROID ) 50 MCG tablet TAKE 1 TABLET BY MOUTH ONCE DAILY BEFORE BREAKFAST 03/10/24  Yes Norleen Lynwood ORN, MD  memantine  (NAMENDA ) 10 MG tablet Take 1 tablet (10 mg total) by mouth 2 (two) times daily. 10/30/23  Yes McCue, Harlene, NP  montelukast  (SINGULAIR ) 10 MG tablet Take 1 tablet (10 mg total) by mouth at bedtime. 01/03/24  Yes Padgett, Danita Macintosh, MD  ondansetron  (ZOFRAN -ODT) 4 MG disintegrating tablet Take 1 tablet (4 mg total) by mouth every 8 (eight) hours as needed for nausea or vomiting. 07/09/22  Yes Merlynn Eland F, FNP  pantoprazole  (PROTONIX ) 40 MG tablet Take 1 tablet by mouth once daily 12/30/23  Yes Norleen Lynwood ORN, MD  pravastatin  (PRAVACHOL ) 80 MG tablet Take 1 tablet by mouth once daily 04/29/23  Yes Norleen Lynwood ORN, MD  SYMBICORT  80-4.5 MCG/ACT inhaler INHALE 2 PUFFS BY MOUTH IN THE MORNING 01/03/24  Yes Padgett, Danita Macintosh, MD  gentamicin  ointment (GARAMYCIN ) 0.1 % Apply 1 Application topically daily. Apply to wound daily between toes.  Use a very small amount. Angelica [name] not taking: Reported on 03/10/2024 08/27/23   McCaughan, Dia D, DPM  Magnesium  Hydroxide (PHILLIPS MILK OF MAGNESIA PO) Take 15-30 mLs by mouth daily as needed (for constipation).    [provider]  potassium chloride  SA (KLOR-CON  M) 20 MEQ tablet Take 2 tablets (40 mEq total) by mouth daily as needed. Take potasium days you take lasix . Angelica [name] not taking: Reported on 03/10/2024 01/03/23   Norleen Lynwood ORN,  MD     Thank you for this interesting consult. I have spent 45 minutes evaluating Angelica [name],  reviewing chart, and discussing plan of care with Angelica [name], family, and primary medical team. If you have any questions or concerns please reach out to me via pager [phone]).  Paula Southerly, MD [facility]

## 2024-04-01 NOTE — TOC Progression Note (Signed)
 Transition of Care [facility]) - Progression Note   Patient Details  Name: CRICKET GOODLIN MRN: [id number] Date of Birth: 12-Feb-1941  [facility]) CM/SW Contact  Duwaine GORMAN Aran, LCSW Phone Number: 04/01/2024, 10:17 AM  Clinical Narrative: Leopoldo accepted HHPT referral in hub. CSW updated daughter, Larry, regarding HH agency. Care management to follow.  Expected Discharge Plan: Home w Home Health Services Barriers to Discharge: Continued Medical Work up  Expected Discharge Plan and Services In-house Referral: Clinical Social Work Post Acute Care Choice: Home Health Living arrangements for the past 2 months: Single Family Home            DME Arranged: N/A DME Agency: NA  Social Drivers of Health (SDOH) Interventions SDOH Screenings   Food Insecurity: No Food Insecurity (03/29/2024)  Housing: Low Risk  (03/29/2024)  Transportation Needs: No Transportation Needs (03/29/2024)  Utilities: Not At Risk (03/29/2024)  Alcohol Screen: Low Risk  (08/20/2023)  Depression (PHQ2-9): Low Risk  (12/17/2023)  Financial Resource Strain: Low Risk  (12/15/2023)  Physical Activity: Inactive (12/15/2023)  Social Connections: Moderately Integrated (12/15/2023)  Stress: No Stress Concern Present (12/15/2023)  Tobacco Use: Medium Risk (03/31/2024)  Health Literacy: Adequate Health Literacy (08/20/2023)   Readmission Risk Interventions    10/08/2022    2:08 PM  Readmission Risk Prevention Plan  Transportation Screening Complete  PCP or Specialist Appt within 5-7 Days Complete  Home Care Screening Complete  Medication Review (RN CM) Complete

## 2024-04-01 NOTE — Plan of Care (Signed)

## 2024-04-01 NOTE — Op Note (Signed)
 Insertion of Chest Tube Procedure Note  Angelica [name]  [id number]  [birth year]  Date:04/01/24  Time:2:47 PM    Provider Performing: PAULA SOUTHERLY   Procedure: Pleural Catheter Insertion w/ Imaging Guidance (67442)  Indication(s) Effusion  Consent Risks of the procedure as well as the alternatives and risks of each were explained to the patient and/or caregiver.  Consent for the procedure was obtained and is signed in the bedside chart  Anesthesia Topical only with 1% lidocaine     Time Out Verified patient identification, verified procedure, site/side was marked, verified correct patient position, special equipment/implants available, medications/allergies/relevant history reviewed, required imaging and test results available.   Sterile Technique Maximal sterile technique including full sterile barrier drape, hand hygiene, sterile gown, sterile gloves, mask, hair covering, sterile ultrasound probe cover (if used).   Procedure Description Ultrasound used to identify appropriate pleural anatomy for placement and overlying skin marked. Area of placement cleaned and draped in sterile fashion.    A 14 French pigtail pleural catheter was placed into the left pleural space using Seldinger technique. Appropriate return of fluid was obtained.  The tube was connected to atrium and placed on -20 cm H2O wall suction.   Complications/Tolerance None; patient tolerated the procedure well. Chest X-ray is ordered to verify placement.   EBL Minimal  Specimen(s) fluid sent for cell count, chemistries, cytology, and microbiology.

## 2024-04-01 NOTE — Progress Notes (Signed)
 Ordered contrast given per NG tube at 1109, NG tube clamped for one hour, then unclamped per instructions.

## 2024-04-01 NOTE — Procedures (Signed)
 Pleural Fibrinolytic Administration Procedure Note  Angelica [name]  [id number]  [birth year]  Date:04/01/24  Time:5:11 PM   Provider Performing:Jackqueline Aquilar D. Harris   Procedure: Pleural Fibrinolysis Initial day (67438)  Indication(s) Fibrinolysis of complicated pleural effusion  Consent Risks of the procedure as well as the alternatives and risks of each were explained to the patient and/or caregiver.  Consent for the procedure was obtained.   Anesthesia None   Time Out Verified patient identification, verified procedure, site/side was marked, verified correct patient position, special equipment/implants available, medications/allergies/relevant history reviewed, required imaging and test results available.   Sterile Technique Hand hygiene, gloves   Procedure Description Existing pleural catheter was cleaned and accessed in sterile manner.  10mg  of tPA in 30cc of saline and 5mg  of dornase in 30cc of sterile water  were injected into pleural space using existing pleural catheter.  Catheter will be clamped for 1 hour and then placed back to suction.   Complications/Tolerance None; patient tolerated the procedure well.  EBL None   Specimen(s) None  Angelica [name] D. Harris, NP-C [facility] Personal contact information can be found on Amion  If no contact or response made please call [phone] 04/01/2024, 5:11 PM

## 2024-04-01 NOTE — Progress Notes (Signed)
 PROGRESS NOTE Angelica [name]  FMW:[id number] DOB: 11-03-1940 DOA: 03/29/2024 PCP: Norleen Lynwood ORN, MD  Brief Narrative/Hospital Course: Angelica [name] is a 83 y.o. female with PMH of GERD, HTN, HLD, IDA, asthma, stage 3b adenocarcinoma of the lung, hypothyroidism, hx of CVA, Alzheimer's, hx of SBO due to adhesions in 2016 and 2021 who presented to ED with complaints of weakness and  5 days of c/o w/ breast being heavy or swollen.  Patient had difficulty working at [employer] and also had chest.  At baseline she can bathe and dress on her own. She feeds herself. Can do all ADLs, but cook.  In  E:  vitals: afebrile, bp: 125/75, HR: 91, RR: 18, oxygen: 98%RA wbc: 21.8, platelets 418, lactic acid: 2.1, troponin 27 CXR: Near-complete opacification of the left lung, differential includes large malignant pleural effusion, central hilar mass with obstruction, lobar atelectasis, and/or pneumonia.  CT head: no acute finding CT abdomen/pelvis: Large highly located left pleural effusion with compressive atelectasis throughout the left lung. Central low-attenuation in the left lower lobe may be due to radiation scarring.Small pericardial effusion and trace right pleural effusion. Moderate stool burden.  Rectal prolapse Patient was placed on IV antibiotics, pulmonary consulted and admitted for further management 11/11 found to have SBO on x-ray with nausea and vomiting x 1  Subjective: Seen and examined No bowel or flatus. Overnight afebrile BP stable Labs with hypokalemia WBC downtrending 23>18 Changing p.o. meds to IV  Assessment and plan:  Large highly loculated left pleural effusion History of ACC of the lung: Presenting mostly with weakness, cough otherwise no significant respiratory symptoms, PCCM following-infection versus malignancy, waiting for CT chest 11/12 and plavix  on hold. Blood culture no growth so far.  On room air. Cont broad-spectrum antibiotics w/ vancomycin  and cefepime .   Continue plan of care per PCCM  Small bowel obstruction Previous SBO X 2:  Patient is complaining of stomach upset and vomited 11/11 x-ray concerning for SBO surgery consulted NG tube inserted. Repeat CT chest 11/11 small bowel obstruction with proximal transition point just beyond the ligament of Treitz Continue IV fluid hydration antiemetics and plan of care as per surgery  Hypokalemia: Replacing iv  Generalized weakness/debility: Cont PT OT eval,keep on fall precaution  Adenocarcinoma of left lung, stage 3: diagnosed in 05/2020-S/p concurrent chemoradiation with weekly carboplatin  for AUC of 2 and paclitaxel  45 mg/M2. S/P 5 X -2022. S/p Consolidation treatment with immunotherapy with Imfinzi  1500 mg IV q 4 wk x 3 cycles- discontinued 2/2 intolerance/sepsis. Currently on observation, follow-up cytology with chest tube   Elevated troponin: Flat and mildly elevated troponin 27>28, suspect demand ischemia due to #1   HTN  Controlled on amlodipine    History of stroke?  HLD Continue statin, allergy to ASA, on Plavix  PTA. Per her neuro note October 30 2023- She had an MRI scan of the brain done on 07/18/2021 which I personally reviewed and shows only mild changes of small vessel disease and generalized atrophy. I do not see a definite right cerebellar stroke but the radiologist has called 1 which apparently was not seen on previous MRI from 06/14/2020. Patient has since been started on Plavix  by Dr. Norleen  Neuro Dr Rosemarie did not feel it was CVA. Given need to hold plavix  for procedure per PCCm we will hold off .   Hypothyroidism Euthyroid TSH 4.0,continue synthroid  50mcg daily    Asthma, severe persistent: No signs of exacerbation, continue bronchodilators   Alzheimer's disease At baseline, Continue  namenda . Keep on fall, delirium precautions    GERD: Continue protonix  daily    CODE STATUS/goals of care: Discussed with patient and daughter and she is a full code-confirm with  family Consult palliative care given patient is having multiple acute issues in the setting of advanced age with malignancy  Mobility: PT Orders: Active  PT Follow up Rec: Home Health Pt11/02/2024 1259   DVT prophylaxis: enoxaparin  (LOVENOX ) injection 40 mg Start: 03/30/24 2200 SCDs Start: 03/29/24 1721 Code Status:   Code Status: Full Code Family Communication: plan of care discussed with patient/daughter not at bedside. Patient status is: Remains hospitalized because of severity of illness Level of care: Telemetry   Dispo: The patient is from: home            Anticipated disposition: TBD Objective: Vitals last 24 hrs: Vitals:   03/30/24 2324 03/31/24 0009 03/31/24 0441 03/31/24 0853  BP: (!) 153/87 (!) 152/58 (!) 159/85   Pulse: 94 64 93   Resp: 16 17 15    Temp: 98 F (36.7 C) 98 F (36.7 C) 98.1 F (36.7 C)   TempSrc: Oral Oral Oral   SpO2: 98% 91% 96% 94%  Weight:      Height:        Physical Examination: General exam: AAO, pleasant not in distress. NGT+ HEENT:Oral mucosa moist, Ear/Nose WNL grossly Respiratory system: Bilaterally diminished at base Cardiovascular system: S1 & S2 +, No JVD. Gastrointestinal system: Abdomen soft, moderately distended bowel sounds sluggish nontender  Nervous System: Alert, awake, moving all extremities,and following commands. Extremities: extremities warm, leg edema  NEG Skin: Warm, no rashes MSK: Normal muscle bulk,tone, power   Medications reviewed:  Scheduled Meds:  amLODipine   10 mg Oral Daily   docusate sodium   100 mg Oral Daily   enoxaparin  (LOVENOX ) injection  40 mg Subcutaneous Q24H   fluticasone   1 spray Each Nare Daily   fluticasone  furoate-vilanterol  1 puff Inhalation Daily   ipratropium  2 spray Each Nare TID   levothyroxine   50 mcg Oral Q0600   loratadine   10 mg Oral Daily   memantine   10 mg Oral BID   montelukast   10 mg Oral QHS   pantoprazole   40 mg Oral Daily   pravastatin   80 mg Oral Daily   vitamin B-12   500 mcg Oral Daily   Continuous Infusions:  ceFEPime  (MAXIPIME ) IV 2 g (03/31/24 0445)   vancomycin  750 mg (03/30/24 2045)   Diet: Diet Order             Diet regular Room service appropriate? Yes; Fluid consistency: Thin  Diet effective now                    Data Reviewed: I have personally reviewed following labs and imaging studies ( see epic result tab) CBC: Recent Labs  Lab 03/29/24 0926 03/30/24 0426 03/31/24 0414 04/01/24 0404  WBC 21.8* 24.0* 23.0* 18.6*  HGB 12.6 12.6 11.9* 11.6*  HCT 37.6 39.8 35.9* 35.0*  MCV 90.8 90.9 90.9 90.4  PLT 418* 425* 413* 425*   CMP: Recent Labs  Lab 03/29/24 0926 03/29/24 1835 03/30/24 0426 03/31/24 0414 04/01/24 0404  NA 140  --  138 136 137  K 3.9  --  3.5 3.5 3.2*  CL 104  --  104 103 103  CO2 24  --  22 19* 19*  GLUCOSE 131*  --  121* 108* 93  BUN 11  --  10 15 17   CREATININE  0.77  --  0.65 0.65 0.63  CALCIUM  9.0  --  9.6 9.5 9.6  MG  --  2.0  --   --  2.2   GFR: Estimated Creatinine Clearance: 47.1 mL/min (by C-G formula based on SCr of 0.63 mg/dL). Recent Labs  Lab 03/29/24 0926  AST 19  ALT 10  ALKPHOS 77  BILITOT 0.7  PROT 7.4  ALBUMIN  3.2*   No results for input(s): LIPASE, AMYLASE in the last 168 hours. No results for input(s): AMMONIA in the last 168 hours. Coagulation Profile:  Recent Labs  Lab 03/29/24 1835  INR 1.2   Unresulted Labs (From admission, onward)     Start     Ordered   04/06/24 0500  Creatinine, serum  (enoxaparin  (LOVENOX )    CrCl >/= 30 ml/min)  Weekly,   R     Comments: while on enoxaparin  therapy   Question:  Specimen collection method  Answer:  Lab=Lab collect   03/30/24 1505   03/31/24 0500  Basic metabolic panel with GFR  Daily,   R      03/30/24 0745   03/31/24 0500  CBC  Daily,   R      03/30/24 0745   03/29/24 1723  Legionella Pneumophila Serogp 1 Ur Ag  Once,   R        03/29/24 1722   03/29/24 1612  Expectorated Sputum Assessment w Gram Stain, Rflx to  Resp Cult  Once,   R        03/29/24 1611           Antimicrobials/Microbiology: Anti-infectives (From admission, onward)    Start     Dose/Rate Route Frequency Ordered Stop   03/30/24 2200  vancomycin  (VANCOREADY) IVPB 750 mg/150 mL        750 mg 150 mL/hr over 60 Minutes Intravenous Every 24 hours 03/29/24 1620     03/30/24 0400  ceFEPIme  (MAXIPIME ) 2 g in sodium chloride  0.9 % 100 mL IVPB        2 g 200 mL/hr over 30 Minutes Intravenous Every 12 hours 03/29/24 1620     03/29/24 1530  vancomycin  (VANCOCIN ) IVPB 1000 mg/200 mL premix        1,000 mg 200 mL/hr over 60 Minutes Intravenous  Once 03/29/24 1517 03/30/24 0228   03/29/24 1530  ceFEPIme  (MAXIPIME ) 2 g in sodium chloride  0.9 % 100 mL IVPB        2 g 200 mL/hr over 30 Minutes Intravenous  Once 03/29/24 1518 03/29/24 1724         Component Value Date/Time   SDES  03/29/2024 1147    BLOOD BLOOD LEFT ARM Performed at [facility], [address] [place], [place] [address]    SPECREQUEST  03/29/2024 1147    BOTTLES DRAWN AEROBIC AND ANAEROBIC Blood Culture results may not be optimal due to an inadequate volume of blood received in culture bottles Performed at [facility], [address], [place], [place] [address]    CULT  03/29/2024 1147    NO GROWTH 3 DAYS Performed at [facility], [address] [place], [place] [address]    REPTSTATUS PENDING 03/29/2024 1147    Procedures: Procedure(s) (LRB): CHEST TUBE INSERTION (Left)   Mennie LAMY, MD [facility] 04/01/2024, 10:59 AM

## 2024-04-01 NOTE — Progress Notes (Signed)
 Progress Note     Subjective: Patient's daughter is at bedside and helps provide history.  Patient states that her abdominal pain is better since placement of NGT. No BM or flatulence. Denies nausea and vomiting. Currently NPO.  ROS  All negative with the exception of above.  Objective: Vital signs in last 24 hours: Temp:  [98 F (36.7 C)-98.7 F (37.1 C)] 98.4 F (36.9 C) (11/12 0736) Pulse Rate:  [93-96] 95 (11/12 0736) Resp:  [15-17] 17 (11/12 0513) BP: (144-159)/(82-87) 144/82 (11/12 0736) SpO2:  [96 %-99 %] 99 % (11/12 0736) Last BM Date :  (pta)  Intake/Output from previous day: 11/11 0701 - 11/12 0700 In: 709.7 [P.O.:180; I.V.:178.4; NG/GT:50; IV Piggyback:301.3] Out: 400 [Urine:400] Intake/Output this shift: No intake/output data recorded.  PE: General: Pleasant female who is sitting in chair at bedside in NAD. HEENT: Head is normocephalic, atraumatic. NGT in place. Heart: Normal HR during encounter.  Lungs: Respiratory effort nonlabored on room air. Breath sounds on left side diminished.  Abd: Soft with distention. Generalized tenderness to palpation. No rebound tenderness or guarding. Scars noted from previous surgeries. Skin: Warm and dry Psych: A&Ox3 with an appropriate affect.    Lab Results:  Recent Labs    03/31/24 0414 04/01/24 0404  WBC 23.0* 18.6*  HGB 11.9* 11.6*  HCT 35.9* 35.0*  PLT 413* 425*   BMET Recent Labs    03/31/24 0414 04/01/24 0404  NA 136 137  K 3.5 3.2*  CL 103 103  CO2 19* 19*  GLUCOSE 108* 93  BUN 15 17  CREATININE 0.65 0.63  CALCIUM  9.5 9.6   PT/INR Recent Labs    03/29/24 1835  LABPROT 16.1*  INR 1.2   CMP     Component Value Date/Time   NA 137 04/01/2024 0404   K 3.2 (L) 04/01/2024 0404   CL 103 04/01/2024 0404   CO2 19 (L) 04/01/2024 0404   GLUCOSE 93 04/01/2024 0404   BUN 17 04/01/2024 0404   CREATININE 0.63 04/01/2024 0404   CREATININE 0.96 12/27/2023 0757   CREATININE 0.86 11/02/2022 1604    CALCIUM  9.6 04/01/2024 0404   PROT 7.4 03/29/2024 0926   PROT 7.7 06/07/2017 0940   ALBUMIN  3.2 (L) 03/29/2024 0926   AST 19 03/29/2024 0926   AST 24 12/27/2023 0757   ALT 10 03/29/2024 0926   ALT 15 12/27/2023 0757   ALKPHOS 77 03/29/2024 0926   BILITOT 0.7 03/29/2024 0926   BILITOT 0.7 12/27/2023 0757   GFRNONAA >60 04/01/2024 0404   GFRNONAA 58 (L) 12/27/2023 0757   GFRAA >60 08/15/2017 0012   Lipase     Component Value Date/Time   LIPASE 30 10/09/2023 1042       Studies/Results: CT ABDOMEN PELVIS W CONTRAST Result Date: 03/31/2024 EXAM: CT ABDOMEN AND PELVIS WITH CONTRAST 03/31/2024 07:48:30 PM TECHNIQUE: CT of the abdomen and pelvis was performed with the administration of intravenous contrast. Multiplanar reformatted images are provided for review. Automated exposure control, iterative reconstruction, and/or weight-based adjustment of the mA/kV was utilized to reduce the radiation dose to as low as reasonably achievable. 100 mL of iohexol  (OMNIPAQUE ) 300 MG/ML solution was administered intravenously. COMPARISON: CT Abdomen and Pelvis dated 03/29/2024. Clinical history for the comparison study is abdominal distention, possible obstruction. CLINICAL HISTORY: Bowel obstruction suspected. FINDINGS: LOWER CHEST: Small right-sided pleural effusion is noted. No focal infiltrate is seen. Left lower lobe consolidation is again identified and stable. Large loculated appearing left pleural effusion is again seen.  LIVER: The liver is within normal limits. GALLBLADDER AND BILE DUCTS: The gallbladder is decompressed. No biliary ductal dilatation. SPLEEN: The spleen is unremarkable. PANCREAS: The pancreas is within normal limits. ADRENAL GLANDS: The adrenal glands are within normal limits. KIDNEYS, URETERS AND BLADDER: The kidneys demonstrate a normal enhancement pattern bilaterally. No renal calculi or obstructive changes are seen. No perinephric or periureteral stranding. The bladder is well  distended with partially opacified urine from prior CT examination. GI AND BOWEL: The stomach is decompressed by a gastric catheter. The proximal small bowel, however, just beyond the ligament of Treitz shows multiple significantly dilated loops of small bowel up to at least 6 cm in diameter. Transition zones are identified just beyond the ligament of Treitz best seen on image number 40 of series 2 and image number 65 of series 6 proximally with a transition zone distally best seen on image number 40 of series 2 and image number 53 of series 6. This is consistent with small bowel obstruction and these are likely related to adhesions from prior surgery. The more distal small bowel is decompressed. The colon shows no obstructive or inflammatory changes. Scattered fecal material is seen. PERITONEUM AND RETROPERITONEUM: No free fluid is seen. No free air. VASCULATURE: Mild atherosclerotic calcifications of the aorta are noted without aneurysmal dilatation. LYMPH NODES: No lymphadenopathy. REPRODUCTIVE ORGANS: The uterus has been surgically removed. BONES AND SOFT TISSUES: No acute osseous abnormality. IMPRESSION: 1. Small bowel obstruction with proximal transition point just beyond the ligament of Treitz, likely due to adhesions from prior surgery. 2. Stable large loculated left pleural effusion with unchanged left lower lobe consolidation. 3. Small right pleural effusion. Electronically signed by: Oneil Devonshire MD 03/31/2024 08:18 PM EST RP Workstation: [id number]   DG Abd 1 View Result Date: 03/31/2024 CLINICAL DATA:  Enteric catheter placement EXAM: ABDOMEN - 1 VIEW COMPARISON:  03/31/2024 at 12:23 p.m. FINDINGS: Supine frontal view of the lower chest and upper abdomen demonstrates enteric catheter passing below diaphragm, tip and side port coiled over the gastric body. Persistent gaseous distension of the small bowel consistent with obstruction. Stable eventration of the left hemidiaphragm. IMPRESSION: 1. Enteric  catheter tip and side port projecting over the gastric body. 2. Stable small bowel obstruction. Electronically Signed   By: Ozell Daring M.D.   On: 03/31/2024 15:44   DG Abd 1 View Result Date: 03/31/2024 CLINICAL DATA:  Nausea and vomiting. EXAM: ABDOMEN - 1 VIEW COMPARISON:  10/06/2022, CT 03/29/2024 FINDINGS: Interval change in bowel gas pattern with several air-filled dilated small bowel loops present. There are several air-filled prominent bowel loops in the central abdomen likely representing small bowel loops. There is air and stool over the rectum. No definite free peritoneal air. Remainder of the exam is unchanged. IMPRESSION: Interval change in bowel gas pattern with several air-filled dilated small bowel loops as well as several air-filled prominent bowel loops in the central abdomen likely representing small bowel loops. Findings likely due to early/partial small bowel obstruction and less likely ileus. Electronically Signed   By: Toribio Agreste M.D.   On: 03/31/2024 13:27    Anti-infectives: Anti-infectives (From admission, onward)    Start     Dose/Rate Route Frequency Ordered Stop   03/30/24 2200  vancomycin  (VANCOREADY) IVPB 750 mg/150 mL        750 mg 150 mL/hr over 60 Minutes Intravenous Every 24 hours 03/29/24 1620     03/30/24 0400  ceFEPIme  (MAXIPIME ) 2 g in sodium chloride  0.9 % 100  mL IVPB        2 g 200 mL/hr over 30 Minutes Intravenous Every 12 hours 03/29/24 1620     03/29/24 1530  vancomycin  (VANCOCIN ) IVPB 1000 mg/200 mL premix        1,000 mg 200 mL/hr over 60 Minutes Intravenous  Once 03/29/24 1517 03/30/24 0228   03/29/24 1530  ceFEPIme  (MAXIPIME ) 2 g in sodium chloride  0.9 % 100 mL IVPB        2 g 200 mL/hr over 30 Minutes Intravenous  Once 03/29/24 1518 03/29/24 1724        Assessment/Plan Nausea, vomiting, SBO  - Original CT from 11/9 showed no intra-abdominal pathology.  - CT from 11/11 shows small bowel obstruction with proximal transition point  just beyond the ligament of Treitz, likely due to adhesions from prior surgery. Stable large loculated left pleural effusion with unchanged left lower lobe consolidation. Small right pleural effusion. -Afebrile.  -WBC decreased to 18.6 from 23.0; HGB 11.6 from 11.9 -Abdomen soft but with distention. Having generalized pain that she feels has improved since NGT placement. No n/v. -NGT output cannister has 400 mL of output. -Maintain NGT and NPO. Will plan for small bowel protocol. 8hr delay film to follow. This was discussed with daughter.  -Hopefully, there is resolution with non-operative management. I did have a brief discussion with daughter who is power of attorney about risk and benefits of surgery should this not self resolve.  -Will continue to follow.   --PCCM following planning for plan for chest tube placement 11/20 after Plavix  hold regarding left pleural effusion.  FEN - NPO/NGT/IVFs per TRH VTE - Plavix  on hold, lovenox  ID - Vanc, maxipime    HTN HLD h/o CVA Alzheimer's disease (although oriented currently) Stage III adenocarcinoma of lung GERD hypothyroidism   LOS: 3 days   I reviewed nursing notes, specialist notes, hospitalist notes, last 24 h vitals and pain scores, last 48 h intake and output, last 24 h labs and trends, and last 24 h imaging results.  This care required moderate level of medical decision making.    Marjorie Carlyon Favre, [facility] Surgery 04/01/2024, 9:26 AM Please see Amion for pager number during day hours 7:00am-4:30pm

## 2024-04-01 NOTE — Progress Notes (Signed)
     Patient Name: Angelica [name]           DOB: [birth year]  MRN: [id number]      Admission Date: 03/29/2024  Attending Provider: Christobal Guadalajara, MD  Primary Diagnosis: Pleural effusion associated with pulmonary infection   Level of care: Telemetry   OVERNIGHT EVENT  11/11-plain films show  several air-filled small bowel loops in the central abdomen CT abdomen/pelvis with contrast was ordered by surgery for further evaluation   CT abdomen/pelvis-  IMPRESSION: 1. Small bowel obstruction with proximal transition point just beyond the ligament of Treitz, likely due to adhesions from prior surgery. 2. Stable large loculated left pleural effusion with unchanged left lower lobe consolidation. 3. Small right pleural effusion.  Per nursing report, no further complications since NG tube placement.  Patient currently resting comfortably   Plan: Continue n.p.o. NG tube remains in place Bowel rest with IV fluid for hydration Surgical team consult appreciated    Lavanda Horns, DNP, ACNPC- [facility] [place]

## 2024-04-01 NOTE — Progress Notes (Signed)
 Per CCM, chest tube unclamped at 1745. Output documented in flowsheets

## 2024-04-02 ENCOUNTER — Inpatient Hospital Stay (HOSPITAL_COMMUNITY)

## 2024-04-02 DIAGNOSIS — J910 Malignant pleural effusion: Secondary | ICD-10-CM | POA: Diagnosis not present

## 2024-04-02 DIAGNOSIS — R1084 Generalized abdominal pain: Secondary | ICD-10-CM

## 2024-04-02 DIAGNOSIS — Z7189 Other specified counseling: Secondary | ICD-10-CM

## 2024-04-02 DIAGNOSIS — K56609 Unspecified intestinal obstruction, unspecified as to partial versus complete obstruction: Secondary | ICD-10-CM | POA: Diagnosis not present

## 2024-04-02 DIAGNOSIS — Z515 Encounter for palliative care: Secondary | ICD-10-CM | POA: Diagnosis not present

## 2024-04-02 DIAGNOSIS — C3492 Malignant neoplasm of unspecified part of left bronchus or lung: Secondary | ICD-10-CM | POA: Diagnosis not present

## 2024-04-02 DIAGNOSIS — J918 Pleural effusion in other conditions classified elsewhere: Secondary | ICD-10-CM | POA: Diagnosis not present

## 2024-04-02 DIAGNOSIS — J189 Pneumonia, unspecified organism: Secondary | ICD-10-CM | POA: Diagnosis not present

## 2024-04-02 DIAGNOSIS — J90 Pleural effusion, not elsewhere classified: Secondary | ICD-10-CM

## 2024-04-02 LAB — BASIC METABOLIC PANEL WITH GFR
Anion gap: 18 — ABNORMAL HIGH (ref 5–15)
BUN: 24 mg/dL — ABNORMAL HIGH (ref 8–23)
CO2: 17 mmol/L — ABNORMAL LOW (ref 22–32)
Calcium: 10 mg/dL (ref 8.9–10.3)
Chloride: 105 mmol/L (ref 98–111)
Creatinine, Ser: 0.75 mg/dL (ref 0.44–1.00)
GFR, Estimated: >60 mL/min (ref >60)
Glucose, Bld: 124 mg/dL — ABNORMAL HIGH (ref 70–99)
Potassium: 4.1 mmol/L (ref 3.5–5.1)
Sodium: 139 mmol/L (ref 135–145)

## 2024-04-02 LAB — CBC
HCT: 41.3 % (ref 36.0–46.0)
Hemoglobin: 13.5 g/dL (ref 12.0–15.0)
MCH: 30.1 pg (ref 26.0–34.0)
MCHC: 32.7 g/dL (ref 30.0–36.0)
MCV: 92 fL (ref 80.0–100.0)
Platelets: 461 K/uL — ABNORMAL HIGH (ref 150–400)
RBC: 4.49 MIL/uL (ref 3.87–5.11)
RDW: 13.8 % (ref 11.5–15.5)
WBC: 16.2 K/uL — ABNORMAL HIGH (ref 4.0–10.5)
nRBC: 0 % (ref 0.0–0.2)

## 2024-04-02 MED ORDER — SODIUM CHLORIDE (PF) 0.9 % IJ SOLN
10.0000 mg | Freq: Once | INTRAMUSCULAR | Status: AC
  Administered 2024-04-02: 10 mg via INTRAPLEURAL
  Filled 2024-04-02: qty 10

## 2024-04-02 MED ORDER — STERILE WATER FOR INJECTION IJ SOLN
5.0000 mg | Freq: Once | RESPIRATORY_TRACT | Status: AC
  Administered 2024-04-02: 5 mg via INTRAPLEURAL
  Filled 2024-04-02: qty 5

## 2024-04-02 NOTE — Progress Notes (Signed)
 Physical Therapy Treatment Patient Details Name: Angelica [name] MRN: [id number] DOB: Aug 05, 1940 Today's Date: 04/02/2024   History of Present Illness Angelica [name] is a 83 y.o. female presented to ED with complaints of weakness; admitted 11/9 with large loculated left pleural effusion.03/31/2024 diagnosed with SBO, placed NGT to LIS and 04/01/24 s/p pigtail chest tube placed PMH: GERD, HTN, HLD, IDA, asthma, stage 3b adenocarcinoma of the lung, hypothyroidism, CVA, Alzheimer's, SBO due to adhesions in 2016 and 2021    PT Comments  Noted NG tube placed 11/11 for SBO and chest tube placed 11/12, significant physical change from evaluation 11/10. Upon arrival, pt moaning in pain with eyes closed, able to state yes and no appropriately to questions but not conversational. Pt needing max A+2 to mobilize to bedside with use of bedpad, minimal UE and LE engagement to assist to getting to bedside. Once sitting pt with fair balance, does fatigue and reports I'm weak requiring CGA to mod A to maintain static sitting. Pt powers to stand with mod A+2, slow to rise up and shift weight forward, difficulty maintaining HHA grip, maintains static standing infront of bed for ~30 sec as RN changes linen. Pt requiring max A+2 to return to supine. Chest tube and NG managed throughout session and secure at end of session with pt supine in bed. Originally recommended HHPT, but pt may need post acute rehab due to increased assistance needed.   If plan is discharge home, recommend the following: A lot of help with walking and/or transfers;A lot of help with bathing/dressing/bathroom;Assistance with cooking/housework;Assist for transportation;Help with stairs or ramp for entrance   Can travel by private vehicle        Equipment Recommendations  Other (comment) (TBD)    Recommendations for Other Services       Precautions / Restrictions Precautions Precautions: Fall Precaution/Restrictions Comments: chest tube,  NG Restrictions Weight Bearing Restrictions Per Provider Order: No     Mobility  Bed Mobility Overal bed mobility: Needs Assistance Bed Mobility: Supine to Sit, Sit to Supine     Supine to sit: Max assist, +2 for physical assistance, +2 for safety/equipment Sit to supine: Max assist, +2 for physical assistance, +2 for safety/equipment   General bed mobility comments: max+2 to mobilize to bedside, minimal effort, chest tube and NG tube monitored by therapist    Transfers Overall transfer level: Needs assistance Equipment used: 2 person hand held assist Transfers: Sit to/from Stand Sit to Stand: Mod assist, +2 physical assistance, +2 safety/equipment           General transfer comment: mod A+2 to power to stand, slow to rise, bil HHA, difficulty maintaining gripped hand due to fatigue and weakness, chest tube and NG tube monitored by therapist, limited to ~30 sec standing in forward flexed position    Ambulation/Gait                   Stairs             Wheelchair Mobility     Tilt Bed    Modified Rankin (Stroke Patients Only)       Balance Overall balance assessment: Needs assistance Sitting-balance support: Feet supported Sitting balance-Leahy Scale: Poor Sitting balance - Comments: min to mod A for static sitting at bedside, fatigues easily   Standing balance support: During functional activity, Bilateral upper extremity supported Standing balance-Leahy Scale: Poor Standing balance comment: ~30 sec static standing with assistance  Communication Communication Communication: Impaired Factors Affecting Communication: Other (comment) (not conversational, able to make needs known)  Cognition Arousal: Lethargic Behavior During Therapy: Restless, Flat affect                           PT - Cognition Comments: mainly moaning softly with eyes closed but opens eyes to cues and able to state yes or no  appropriately Following commands: Impaired Following commands impaired: Follows one step commands inconsistently, Follows one step commands with increased time    Cueing Cueing Techniques: Verbal cues, Gestural cues, Tactile cues, Visual cues  Exercises      General Comments        Pertinent Vitals/Pain Pain Assessment Pain Assessment: Faces Faces Pain Scale: Hurts whole lot Pain Location: rubbing L abdomen/flank Pain Descriptors / Indicators: Grimacing, Guarding, Moaning Pain Intervention(s): Limited activity within patient's tolerance, Monitored during session, Repositioned    Home Living                          Prior Function            PT Goals (current goals can now be found in the care plan section) Acute Rehab PT Goals Patient Stated Goal: agreeable to HHPT recommendation PT Goal Formulation: With patient/family Time For Goal Achievement: 04/13/24 Potential to Achieve Goals: Good Progress towards PT goals: Not progressing toward goals - comment    Frequency    Min 2X/week      PT Plan      Co-evaluation              AM-PAC PT 6 Clicks Mobility   Outcome Measure  Help needed turning from your back to your side while in a flat bed without using bedrails?: Total Help needed moving from lying on your back to sitting on the side of a flat bed without using bedrails?: Total Help needed moving to and from a bed to a chair (including a wheelchair)?: A Lot Help needed standing up from a chair using your arms (e.g., wheelchair or bedside chair)?: A Lot Help needed to walk in hospital room?: Total Help needed climbing 3-5 steps with a railing? : Total 6 Click Score: 8    End of Session   Activity Tolerance: Patient limited by fatigue;Patient limited by pain Patient left: in bed;with call bell/phone within reach;with bed alarm set;with nursing/sitter in room Nurse Communication: Mobility status PT Visit Diagnosis: Unsteadiness on feet  (R26.81);Muscle weakness (generalized) (M62.81)     Time: 8469-8444 PT Time Calculation (min) (ACUTE ONLY): 25 min  Charges:    $Therapeutic Activity: 23-37 mins PT General Charges $$ ACUTE PT VISIT: 1 Visit                     Tori Genese Quebedeaux PT, DPT 04/02/24, 4:22 PM

## 2024-04-02 NOTE — Plan of Care (Signed)

## 2024-04-02 NOTE — Progress Notes (Signed)
 PT Cancellation Note  Patient Details Name: Angelica [name] MRN: [id number] DOB: 04/30/1941   Cancelled Treatment:    Reason Eval/Treat Not Completed: Fatigue/lethargy limiting ability to participate. Pt resting soundly upon therapist's arrival at 11:43. RN reports recent morphine  administration and pt now resting. Will check back for acute PT services as schedule permits.   Metta Ave PT, DPT 04/02/24, 12:20 PM

## 2024-04-02 NOTE — Progress Notes (Signed)
 PROGRESS NOTE Angelica [name]  FMW:[id number] DOB: 1941-01-20 DOA: 03/29/2024 PCP: Norleen Lynwood ORN, MD  Brief Narrative/Hospital Course: Angelica [name] is a 83 y.o. female with PMH of GERD, HTN, HLD, IDA, asthma, stage 3b adenocarcinoma of the lung, hypothyroidism, hx of CVA, Alzheimer's, hx of SBO due to adhesions in 2016 and 2021 who presented to ED with complaints of weakness and  5 days of c/o w/ breast being heavy or swollen.  Patient had difficulty working at [employer] and also had chest.  At baseline she can bathe and dress on her own. She feeds herself. Can do all ADLs, but cook.  In  E:  vitals: afebrile, bp: 125/75, HR: 91, RR: 18, oxygen: 98%RA wbc: 21.8, platelets 418, lactic acid: 2.1, troponin 27 CXR: Near-complete opacification of the left lung, differential includes large malignant pleural effusion, central hilar mass with obstruction, lobar atelectasis, and/or pneumonia.  CT head: no acute finding CT abdomen/pelvis: Large highly located left pleural effusion with compressive atelectasis throughout the left lung. Central low-attenuation in the left lower lobe may be due to radiation scarring.Small pericardial effusion and trace right pleural effusion. Moderate stool burden.  Rectal prolapse Patient was placed on IV antibiotics, pulmonary consulted and admitted for further management 11/11 found to have SBO on x-ray with nausea and vomiting x 1  Subjective: Patient endorsed continuing pain on the left side of her chest, in part related to soreness from the chest tube that was placed yesterday.  Also has NG tube in place, denies any abdominal pain, she is somewhat unreliable historian due to underlying cognitive issues.  Assessment and plan:  Large highly loculated left pleural effusion History of ACC of the lung: Presenting mostly with weakness, cough otherwise no significant respiratory symptoms, PCCM following-infection versus malignancy, Blood culture no growth so far.  On  room air. Cont broad-spectrum antibiotics w/ vancomycin  and cefepime .  She had a chest tube placed on 04/01/2024, received a dose of tPA/dornase 11/12 with 1500 mL of output.  tPA/dornase to be repeated today.  Pulmonology is managing chest tube. Fluid studies suggest possible infectious etiology due to elevated WBC count, elevated LDH, elevated protein fluid.  Small bowel obstruction Previous SBO X 2:  Patient complained of stomach upset and vomited 11/11 x-ray concerning for SBO surgery consulted NG tube inserted. Repeat CT abdomen 11/11 small bowel obstruction with proximal transition point just beyond the ligament of Treitz Continue IV fluid hydration antiemetics, continue with NG tube.  Continue with conservative management.  Abdominal x-rays continue to show evidence of small bowel obstruction, hemoglobin is some gas visible in the colon now. General surgery is following.  Hypokalemia: Replacing iv  Generalized weakness/debility: Cont PT OT eval,keep on fall precaution  Adenocarcinoma of left lung, stage 3: diagnosed in 05/2020-S/p concurrent chemoradiation with weekly carboplatin  for AUC of 2 and paclitaxel  45 mg/M2. S/P 5 X -2022. S/p Consolidation treatment with immunotherapy with Imfinzi  1500 mg IV q 4 wk x 3 cycles- discontinued 2/2 intolerance/sepsis. Currently on observation, follow-up cytology with chest tube   Elevated troponin: Flat and mildly elevated troponin 27>28, suspect demand ischemia due to #1   HTN  Controlled on amlodipine    History of stroke?  HLD Continue statin, allergy to ASA, on Plavix  PTA. Per her neuro note October 30 2023- She had an MRI scan of the brain done on 07/18/2021 which I personally reviewed and shows only mild changes of small vessel disease and generalized atrophy. I do not see a definite right  cerebellar stroke but the radiologist has called 1 which apparently was not seen on previous MRI from 06/14/2020. Patient has since been started on  Plavix  by Dr. Norleen  Neuro Dr Rosemarie did not feel it was CVA. Given need to hold plavix  for procedure per PCCm we will hold off .   Hypothyroidism Euthyroid TSH 4.0,continue synthroid  50mcg daily    Asthma, severe persistent: No signs of exacerbation, continue bronchodilators   Alzheimer's disease At baseline, Continue namenda . Keep on fall, delirium precautions    GERD: Continue protonix  daily    CODE STATUS/goals of care: Discussed with patient and daughter and she is a full code-confirm with family Consult palliative care given patient is having multiple acute issues in the setting of advanced age with malignancy  Mobility: PT Orders: Active  PT Follow up Rec: Home Health Pt (Vs Sn[facility])04/02/2024 1608   DVT prophylaxis: enoxaparin  (LOVENOX ) injection 40 mg Start: 03/30/24 2200 SCDs Start: 03/29/24 1721 Code Status:   Code Status: Full Code Family Communication: plan of care discussed with patient/daughter not at bedside. Patient status is: Remains hospitalized because of severity of illness Level of care: Telemetry   Dispo: The patient is from: home            Anticipated disposition: TBD Objective: Vitals last 24 hrs: Vitals:   04/02/24 0731 04/02/24 1115 04/02/24 1116 04/02/24 1329  BP:   130/86 (!) 142/85  Pulse: 97  (!) 102 98  Resp: 18  17 15   Temp:  97.8 F (36.6 C)  99.3 F (37.4 C)  TempSrc:  Oral  Axillary  SpO2: 94%  100% 98%  Weight:      Height:        Physical Examination: General exam: AAO, appears in discomfort, NGT+ HEENT:Oral mucosa moist, Ear/Nose WNL grossly Respiratory system: Bilaterally diminished at base, left-sided chest tube in place Cardiovascular system: S1 & S2 +, No JVD. Gastrointestinal system: Abdomen soft, moderately distended bowel sounds sluggish nontender  Nervous System: Alert, awake, moving all extremities,and following commands. Extremities: extremities warm, leg edema  NEG Skin: Warm, no rashes MSK: Normal muscle  bulk,tone, power   Medications reviewed:  Scheduled Meds:  amLODipine   10 mg Oral Daily   docusate  100 mg Per Tube Daily   enoxaparin  (LOVENOX ) injection  40 mg Subcutaneous Q24H   fluticasone   1 spray Each Nare Daily   fluticasone  furoate-vilanterol  1 puff Inhalation Daily   ipratropium  2 spray Each Nare TID   [START ON 04/08/2024] levothyroxine   37.5 mcg Intravenous Daily   loratadine   10 mg Oral Daily   memantine   10 mg Oral BID   montelukast   10 mg Oral QHS   pantoprazole  (PROTONIX ) IV  40 mg Intravenous Q12H   pravastatin   80 mg Oral Daily   sodium chloride  flush  10 mL Intrapleural Q8H   vitamin B-12  500 mcg Oral Daily   Continuous Infusions:  ceFEPime  (MAXIPIME ) IV 2 g (04/02/24 1524)   vancomycin  Stopped (04/01/24 2303)   Diet: Diet Order             Diet NPO time specified  Diet effective now                    Data Reviewed: I have personally reviewed following labs and imaging studies ( see epic result tab) CBC: Recent Labs  Lab 03/29/24 0926 03/30/24 0426 03/31/24 0414 04/01/24 0404 04/02/24 0412  WBC 21.8* 24.0* 23.0* 18.6* 16.2*  HGB 12.6  12.6 11.9* 11.6* 13.5  HCT 37.6 39.8 35.9* 35.0* 41.3  MCV 90.8 90.9 90.9 90.4 92.0  PLT 418* 425* 413* 425* 461*   CMP: Recent Labs  Lab 03/29/24 0926 03/29/24 1835 03/30/24 0426 03/31/24 0414 04/01/24 0404 04/02/24 0412  NA 140  --  138 136 137 139  K 3.9  --  3.5 3.5 3.2* 4.1  CL 104  --  104 103 103 105  CO2 24  --  22 19* 19* 17*  GLUCOSE 131*  --  121* 108* 93 124*  BUN 11  --  10 15 17  24*  CREATININE 0.77  --  0.65 0.65 0.63 0.75  CALCIUM  9.0  --  9.6 9.5 9.6 10.0  MG  --  2.0  --   --  2.2  --    GFR: Estimated Creatinine Clearance: 47.1 mL/min (by C-G formula based on SCr of 0.75 mg/dL). Recent Labs  Lab 03/29/24 0926  AST 19  ALT 10  ALKPHOS 77  BILITOT 0.7  PROT 7.4  ALBUMIN  3.2*   No results for input(s): LIPASE, AMYLASE in the last 168 hours. No results for input(s):  AMMONIA in the last 168 hours. Coagulation Profile:  Recent Labs  Lab 03/29/24 1835  INR 1.2   Unresulted Labs (From admission, onward)     Start     Ordered   04/06/24 0500  Creatinine, serum  (enoxaparin  (LOVENOX )    CrCl >/= 30 ml/min)  Weekly,   R     Comments: while on enoxaparin  therapy   Question:  Specimen collection method  Answer:  Lab=Lab collect   03/30/24 1505   04/01/24 1415  Cholesterol, body fluid  (Thoracentesis Labs Panel)  Once,   R        04/01/24 1415   04/01/24 1415  pH, Body Fluid [as Miscellaneous test (send-out)]  (Thoracentesis Labs Panel)  Once,   R       Comments: **SEND SPECIMEN ON ICE**  LAB ORDER:  PH, BODY FLUID - [id number] HAS BEEN DISCONTINUED AT [facility], AND IS NO LONGER AVAILABLE.THIS MISC. LAB ORDER SHOULD BE USED INSTEAD.  HOSPITAL LABORATORY WILL SEND TO [facility].  RESULT WILL DISPLAY IN RESULTS REVIEW MISCELLANEOUS TEST WITH A SCANNED REPORT LINK   Question:  Test name / description:  Answer:  PH, BODY FLUID / [facility] ORDER #[id number]   04/01/24 1415   04/01/24 1415  Fungus Culture With Stain  (Thoracentesis Labs Panel)  Once,   R        04/01/24 1415   03/31/24 0500  Basic metabolic panel with GFR  Daily,   R      03/30/24 0745   03/31/24 0500  CBC  Daily,   R      03/30/24 0745   03/29/24 1612  Expectorated Sputum Assessment w Gram Stain, Rflx to Resp Cult  Once,   R        03/29/24 1611           Antimicrobials/Microbiology: Anti-infectives (From admission, onward)    Start     Dose/Rate Route Frequency Ordered Stop   03/30/24 2200  vancomycin  (VANCOREADY) IVPB 750 mg/150 mL        750 mg 150 mL/hr over 60 Minutes Intravenous Every 24 hours 03/29/24 1620     03/30/24 0400  ceFEPIme  (MAXIPIME ) 2 g in sodium chloride  0.9 % 100 mL IVPB        2 g 200 mL/hr over 30 Minutes Intravenous Every  12 hours 03/29/24 1620     03/29/24 1530  vancomycin  (VANCOCIN ) IVPB 1000 mg/200 mL premix        1,000 mg 200 mL/hr over 60  Minutes Intravenous  Once 03/29/24 1517 03/30/24 0228   03/29/24 1530  ceFEPIme  (MAXIPIME ) 2 g in sodium chloride  0.9 % 100 mL IVPB        2 g 200 mL/hr over 30 Minutes Intravenous  Once 03/29/24 1518 03/29/24 1724         Component Value Date/Time   SDES  04/01/2024 1415    PLEURAL Performed at [facility], [address] [place], [place] [address]    SPECREQUEST  04/01/2024 1415    LEFT LUNG Performed at [facility], [address] [place], [place] [address]    CULT  04/01/2024 1415    NO GROWTH < 12 HOURS Performed at [facility], [address], [place], [place] [address]    REPTSTATUS PENDING 04/01/2024 1415    Procedures: Procedure(s) (LRB): CHEST TUBE INSERTION (Left)   MARGARETMARY DELENA HALEY, MD [facility] 04/02/2024, 4:14 PM

## 2024-04-02 NOTE — Progress Notes (Signed)
 NAME:  Angelica [name], MRN:  [id number], DOB:  December 03, 1940, LOS: 4 ADMISSION DATE:  03/29/2024, CONSULTATION DATE:  04/02/24 REFERRING MD:  Dr. Christobal, CHIEF COMPLAINT:  Pleural Effusion   History of Present Illness:   Angelica [name] is an 83 y/o F with a PMH significant for GERD, HTN, HLD, stage 3B NSCLC, hypothyroidism, Alzheimer's dementia who presented for weakness and shortness of breath now found to have a large loculated left pleural effusion. Pulmonology consulted for evaluation and management of the latter.  History mostly obtained from discussion with daughter and chart review. Per daughter, patient developing worsening shortness of breath to the point she was unable to walk without getting really winded. She also noted some chest pressure/pain. Daughter called 911. The patient was brought into the ED and CT C/A/P showed a large left sided loculated pleural effusion.   Of note, patient previously had thoracentesis on left side 10/07/2022. Cytology negative for malignancy at that time. Lymphocytic effusion. LDH low at 71. Protein 3.2.  Pertinent  Medical History   Past Medical History:  Diagnosis Date   Allergy 05/21/1978   Arthritis    Asthma    Bowel obstruction (HCC)    Cancer (HCC)    Cataract    Environmental allergies    GERD (gastroesophageal reflux disease)    Glaucoma 05/09/2017   H. pylori infection    History of radiation therapy 07/05/20-08/05/20   Left lung IMRT Dr. Shannon    HLD (hyperlipidemia) 01/16/2019   Hypertension    Iron  deficiency anemia    Osteopenia 05/26/2017   Thyroid  disease    Urticaria 07/25/2018   Significant Hospital Events: Including procedures, antibiotic start and stop dates in addition to other pertinent events   Patient admitted to [facility], plavix  dose given on 11/10, discontinued plavix  order. 03/31/2024, diagnosed with SBO, placed NGT to LIS, CT ABD/PLV shows SBO with proximal transition point beyond ligament of Treitz. CT re-demonstrates left  pleural effusion. 04/01/24 --> pigtail chest tube placed, first dose of TPA/Dornase, 1500 mL out since procedure, no air leak.  Interim History / Subjective:  Feeling well. Some discomfort at chest tube insertion site.  Objective    Blood pressure (!) 148/88, pulse 97, temperature (!) 97.4 F (36.3 C), temperature source Oral, resp. rate 18, height 5' 5 (1.651 m), weight 59.6 kg, SpO2 94%.        Intake/Output Summary (Last 24 hours) at 04/02/2024 0943 Last data filed at 04/02/2024 0900 Gross per 24 hour  Intake 1034.72 ml  Output 1897 ml  Net -862.28 ml   Filed Weights   03/29/24 1600 03/29/24 2345 03/30/24 0626  Weight: 59 kg 59.6 kg 59.6 kg    Examination: General: NAD, alert, WD, WN Eyes: PERRL, no scleral icterus ENMT: oropharynx clear, good dentition, no oral lesions, mallampati score I CV: RRR, systolic murmur, nl S1 and S2, no peripheral edema Resp: decreased breath sounds in left lower lung field Abdom: tender to palpation Neuro: Awake alert oriented to person place time and situation   Resolved problem list   Assessment and Plan   #Large Loculated Pleural Effusion: > Unclear etiology. Ddx includes infectious etiology versus malignant. Suspect maybe infectious given cell count has 2600 WBCs, 90% PMNs and pleural fluid LDH is 454 suggestive of complicated parapneumonic effusion. Cytology pending at this time. - FU pleural fluid microbiology and cytology - Daily CXR - Keep CT on - 20 suction - Intrapleural fibrinolytics - second dose administered today at 9:15 AM, open CT  at 10:15 AM  #Hx of CVA: - Continue to hold plavix .  #SBO: - Plan per surgery. - Continue NGT to LIS  Labs   CBC: Recent Labs  Lab 03/29/24 0926 03/30/24 0426 03/31/24 0414 04/01/24 0404 04/02/24 0412  WBC 21.8* 24.0* 23.0* 18.6* 16.2*  HGB 12.6 12.6 11.9* 11.6* 13.5  HCT 37.6 39.8 35.9* 35.0* 41.3  MCV 90.8 90.9 90.9 90.4 92.0  PLT 418* 425* 413* 425* 461*    Basic  Metabolic Panel: Recent Labs  Lab 03/29/24 0926 03/29/24 1835 03/30/24 0426 03/31/24 0414 04/01/24 0404 04/02/24 0412  NA 140  --  138 136 137 139  K 3.9  --  3.5 3.5 3.2* 4.1  CL 104  --  104 103 103 105  CO2 24  --  22 19* 19* 17*  GLUCOSE 131*  --  121* 108* 93 124*  BUN 11  --  10 15 17  24*  CREATININE 0.77  --  0.65 0.65 0.63 0.75  CALCIUM  9.0  --  9.6 9.5 9.6 10.0  MG  --  2.0  --   --  2.2  --    GFR: Estimated Creatinine Clearance: 47.1 mL/min (by C-G formula based on SCr of 0.75 mg/dL). Recent Labs  Lab 03/29/24 1216 03/29/24 1835 03/30/24 0426 03/31/24 0414 04/01/24 0404 04/02/24 0412  PROCALCITON  --  0.76 0.74  --   --   --   WBC  --   --  24.0* 23.0* 18.6* 16.2*  LATICACIDVEN 2.1*  --   --   --   --   --     Liver Function Tests: Recent Labs  Lab 03/29/24 0926  AST 19  ALT 10  ALKPHOS 77  BILITOT 0.7  PROT 7.4  ALBUMIN  3.2*   No results for input(s): LIPASE, AMYLASE in the last 168 hours. No results for input(s): AMMONIA in the last 168 hours.  ABG    Component Value Date/Time   TCO2 25 08/22/2016 1818     Coagulation Profile: Recent Labs  Lab 03/29/24 1835  INR 1.2    Cardiac Enzymes: No results for input(s): CKTOTAL, CKMB, CKMBINDEX, TROPONINI in the last 168 hours.  HbA1C: Hgb A1c MFr Bld  Date/Time Value Ref Range Status  07/18/2021 08:35 AM 5.8 4.6 - 6.5 % Final    Comment:    Glycemic Control Guidelines for People with Diabetes:Non Diabetic:  <6%Goal of Therapy: <7%Additional Action Suggested:  >8%   05/03/2020 05:07 PM 5.8 4.6 - 6.5 % Final    Comment:    Glycemic Control Guidelines for People with Diabetes:Non Diabetic:  <6%Goal of Therapy: <7%Additional Action Suggested:  >8%     CBG: Recent Labs  Lab 03/29/24 0958  GLUCAP 110*    Review of Systems:   Not obtained  Past Medical History:  She,  has a past medical history of Allergy (05/21/1978), Arthritis, Asthma, Bowel obstruction (HCC), Cancer  (HCC), Cataract, Environmental allergies, GERD (gastroesophageal reflux disease), Glaucoma (05/09/2017), H. pylori infection, History of radiation therapy (07/05/20-08/05/20), HLD (hyperlipidemia) (01/16/2019), Hypertension, Iron  deficiency anemia, Osteopenia (05/26/2017), Thyroid  disease, and Urticaria (07/25/2018).   Surgical History:   Past Surgical History:  Procedure Laterality Date   ABDOMINAL HYSTERECTOMY     BOWEL RESECTION N/A 05/23/2020   Procedure: SMALL BOWEL REPAIR;  Surgeon: Kinsinger, Herlene Righter, MD;  Location: [facility];  Service: General;  Laterality: N/A;   BREAST BIOPSY     BRONCHIAL BIOPSY  06/10/2020   Procedure: BRONCHIAL BIOPSIES;  Surgeon:  Brenna Adine CROME, DO;  Location: [facility];  Service: Pulmonary;;   BRONCHIAL BRUSHINGS  06/10/2020   Procedure: BRONCHIAL BRUSHINGS;  Surgeon: Brenna Adine CROME, DO;  Location: [facility];  Service: Pulmonary;;   BRONCHIAL NEEDLE ASPIRATION BIOPSY  06/10/2020   Procedure: BRONCHIAL NEEDLE ASPIRATION BIOPSIES;  Surgeon: Brenna Adine CROME, DO;  Location: [facility];  Service: Pulmonary;;   BRONCHIAL WASHINGS  06/10/2020   Procedure: BRONCHIAL WASHINGS;  Surgeon: Brenna Adine CROME, DO;  Location: [facility];  Service: Pulmonary;;   COLON SURGERY     DIAGNOSTIC LARYNGOSCOPY N/A 05/23/2020   Procedure: ATTEMPTED DIAGNOSTIC LAPAROSCOPY WITH ADHESIONS;  Surgeon: Stevie, Herlene Righter, MD;  Location: [facility];  Service: General;  Laterality: N/A;   EYE SURGERY     IR IMAGING GUIDED PORT INSERTION  07/15/2020   IR REMOVAL TUN ACCESS W/ PORT W/O FL MOD SED  01/16/2023   KNEE SURGERY     LAPAROTOMY N/A 04/18/2015   Procedure: Exploratory laparotomy with lysis of adhesions, possible bowel resection;  Surgeon: Lynda Leos, MD;  Location: [facility];  Service: General;  Laterality: N/A;   LAPAROTOMY N/A 05/23/2020   Procedure: EXPLORATORY LAPAROTOMY;  Surgeon: Stevie Herlene Righter, MD;  Location: [facility];  Service: General;  Laterality: N/A;    LYSIS OF ADHESION N/A 05/23/2020   Procedure: LYSIS OF ADHESION;  Surgeon: Stevie Herlene Righter, MD;  Location: [facility];  Service: General;  Laterality: N/A;   NASAL SINUS SURGERY     SMALL INTESTINE SURGERY     THORACENTESIS Left 12/03/2022   Procedure: THORACENTESIS;  Surgeon: Brenna Adine CROME, DO;  Location: [facility];  Service: Pulmonary;  Laterality: Left;   THYROID  SURGERY     VIDEO BRONCHOSCOPY WITH ENDOBRONCHIAL NAVIGATION Bilateral 06/10/2020   Procedure: VIDEO BRONCHOSCOPY WITH ENDOBRONCHIAL NAVIGATION;  Surgeon: Brenna Adine CROME, DO;  Location: [facility];  Service: Pulmonary;  Laterality: Bilateral;   VIDEO BRONCHOSCOPY WITH ENDOBRONCHIAL ULTRASOUND  06/10/2020   Procedure: VIDEO BRONCHOSCOPY WITH ENDOBRONCHIAL ULTRASOUND;  Surgeon: Brenna Adine CROME, DO;  Location: [facility];  Service: Pulmonary;;     Social History:   reports that she quit smoking about 53 years ago. Her smoking use included cigarettes. She has been exposed to tobacco smoke. She has never used smokeless tobacco. She reports that she does not drink alcohol and does not use drugs.   Family History:  Her family history includes Diabetes in her mother; Glaucoma in her brother and sister; Hypertension in her father and mother. There is no history of Allergic rhinitis, Angioedema, Asthma, Eczema, Immunodeficiency, Urticaria, BRCA 1/2, or Breast cancer.   Allergies Allergies  Allergen Reactions   Asa Buff (Mag [Buffered Aspirin] Other (See Comments)    Excessive sweating   Ensure [Nutritional Supplements]     Or boost - upset stomach    Fish Allergy Other (See Comments)    Unknown reaction, but allergic   Other Other (See Comments)    Gelcaps; gel-containing capsules; extended-release medication - upset stomach    Peanut-Containing Drug Products Other (See Comments)    From allergy test   Penicillins Itching   Shellfish Protein-Containing Drug Products Other (See Comments)    From allergy test    Strawberry Extract Nausea And Vomiting and Other (See Comments)    From allergy test      Home Medications  Prior to Admission medications   Medication Sig Start Date End Date Taking? Authorizing Provider  acetaminophen  (TYLENOL ) 500 MG tablet Take 1,000 mg by mouth every 6 (  six) hours as needed for moderate pain.   Yes [provider]  albuterol  (VENTOLIN  HFA) 108 (90 Base) MCG/ACT inhaler Inhale 2 puffs into the lungs every 4 (four) hours as needed for wheezing or shortness of breath. 01/03/24  Yes Padgett, Danita Macintosh, MD  alum & mag hydroxide-simeth (MAALOX/MYLANTA) 200-200-20 MG/5ML suspension Take 15 mLs by mouth every 6 (six) hours as needed for indigestion or heartburn.   Yes [provider]  amLODipine  (NORVASC ) 10 MG tablet Take 1 tablet (10 mg total) by mouth daily. 06/17/23  Yes Norleen Lynwood ORN, MD  benralizumab  (FASENRA ) 30 MG/ML prefilled syringe Inject 1 mL (30 mg total) into the skin every 8 (eight) weeks. 07/15/23  Yes Padgett, Danita Macintosh, MD  cetirizine  [facility] ALLERGY RELIEF, CETIRIZINE ,) 10 MG tablet Take 1 tablet (10 mg total) by mouth daily. 01/03/24  Yes Jeneal Danita Macintosh, MD  clopidogrel  (PLAVIX ) 75 MG tablet Take 1 tablet by mouth once daily 07/18/23  Yes Norleen Lynwood ORN, MD  diclofenac  Sodium (VOLTAREN ) 1 % GEL Apply 4 g topically 4 (four) times daily. Patient taking differently: Apply 4 g topically 4 (four) times daily as needed (pain). 04/23/22  Yes Corey, Evan S, MD  EPINEPHrine  (EPIPEN  2-PAK) 0.3 mg/0.3 mL IJ SOAJ injection Use as directed for severe allergic reaction 01/03/24  Yes Padgett, Danita Macintosh, MD  fluticasone  (FLONASE ) 50 MCG/ACT nasal spray USE 1 SPRAY(S) IN EACH NOSTRIL IN THE MORNING AND AT BEDTIME Patient taking differently: Place 1 spray into both nostrils daily. 01/14/24  Yes Jeneal Danita Macintosh, MD  ipratropium (ATROVENT ) 0.06 % nasal spray Place 2 sprays into both nostrils 3 (three) times daily as needed for rhinitis.  01/03/24  Yes Jeneal Danita Macintosh, MD  levothyroxine  (SYNTHROID ) 50 MCG tablet TAKE 1 TABLET BY MOUTH ONCE DAILY BEFORE BREAKFAST 03/10/24  Yes Norleen Lynwood ORN, MD  memantine  (NAMENDA ) 10 MG tablet Take 1 tablet (10 mg total) by mouth 2 (two) times daily. 10/30/23  Yes McCue, Harlene, NP  montelukast  (SINGULAIR ) 10 MG tablet Take 1 tablet (10 mg total) by mouth at bedtime. 01/03/24  Yes Padgett, Danita Macintosh, MD  ondansetron  (ZOFRAN -ODT) 4 MG disintegrating tablet Take 1 tablet (4 mg total) by mouth every 8 (eight) hours as needed for nausea or vomiting. 07/09/22  Yes Merlynn Eland F, FNP  pantoprazole  (PROTONIX ) 40 MG tablet Take 1 tablet by mouth once daily 12/30/23  Yes Norleen Lynwood ORN, MD  pravastatin  (PRAVACHOL ) 80 MG tablet Take 1 tablet by mouth once daily 04/29/23  Yes Norleen Lynwood ORN, MD  SYMBICORT  80-4.5 MCG/ACT inhaler INHALE 2 PUFFS BY MOUTH IN THE MORNING 01/03/24  Yes Padgett, Danita Macintosh, MD  gentamicin  ointment (GARAMYCIN ) 0.1 % Apply 1 Application topically daily. Apply to wound daily between toes.  Use a very small amount. Patient not taking: Reported on 03/10/2024 08/27/23   McCaughan, Dia D, DPM  Magnesium  Hydroxide (PHILLIPS MILK OF MAGNESIA PO) Take 15-30 mLs by mouth daily as needed (for constipation).    [provider]  potassium chloride  SA (KLOR-CON  M) 20 MEQ tablet Take 2 tablets (40 mEq total) by mouth daily as needed. Take potasium days you take lasix . Patient not taking: Reported on 03/10/2024 01/03/23   Norleen Lynwood ORN, MD     Thank you for this interesting consult. I have spent 45 minutes evaluating patient, reviewing chart, and discussing plan of care with patient, family, and primary medical team. If you have any questions or concerns please reach out to me via  pager [phone]).  Paula Southerly, MD [facility]

## 2024-04-02 NOTE — Procedures (Signed)
 Pleural Fibrinolytic Administration Procedure Note  Angelica [name]  [id number]  20-May-1941  Date:04/02/24  Time:9:43 AM   Provider Performing:Nuala Chiles CATHERINE   Procedure: Pleural Fibrinolysis Subsequent day [phone])  Indication(s) Fibrinolysis of complicated pleural effusion  Consent Risks of the procedure as well as the alternatives and risks of each were explained to the patient and/or caregiver.  Consent for the procedure was obtained.   Anesthesia None   Time Out Verified patient identification, verified procedure, site/side was marked, verified correct patient position, special equipment/implants available, medications/allergies/relevant history reviewed, required imaging and test results available.   Sterile Technique Hand hygiene, gloves   Procedure Description Existing pleural catheter was cleaned and accessed in sterile manner.  10mg  of tPA in 30cc of saline and 5mg  of dornase in 30cc of sterile water  were injected into pleural space using existing pleural catheter.  Catheter will be clamped for 1 hour and then placed back to suction.   Complications/Tolerance None; patient tolerated the procedure well.  EBL None   Specimen(s) None

## 2024-04-02 NOTE — Consult Note (Signed)
 Palliative Care Consult Note                                  Date: 04/02/2024   Patient Name: Angelica [name]  DOB: September 14, 1940  MRN: [id number]  Age / Sex: 83 y.o., female  PCP: Angelica Lynwood ORN, MD Referring Physician: Redia Margaretmary LABOR, MD  Reason for Consultation: Establishing goals of care  HPI/Patient Profile: 83 y.o. 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  She presented to the ED on 03/29/2024 with weakness.  She is admitted with a large highly loculated left pleural effusion and small bowel obstruction.  Palliative Medicine has been consulted for goals of care discussions and complex medical decision making.   Clinical Assessment and Goals of Care:   Extensive chart review has been completed including labs, vital signs, imaging, progress/consult notes, orders, medications and available advance directive documents.    Update received from RN. Patient was complaining of left-sided chest pain earlier (around chest tube site), and received morphine  2 mg IV at 1117.  Patient assessed. She is sleeping but awakens to voice. However she is lethargic/drowsy and not able to participate in meaningful GOC discussion at this time. She denies pain, but does report a dry mouth.   I later spoke with her daughter/Angelica [name] by phone to discuss diagnosis, prognosis, and GOC.  I introduced Palliative Medicine as specialized medical care for people living with serious illness. It focuses on providing relief from the symptoms and stress of a serious illness.   Created space and opportunity for daughter to express thoughts and feelings regarding current medical situation. Values and goals of care were attempted to be elicited.  Social History and Functional Status: Patient lives at home with her daughter/Angelica [name]. At  baseline, she is ambulatory and independent with ADLs. She can perform some light housework, with the exception of cooking.    Discussion: We discussed patient's current illness and what it means in the larger context of her ongoing co-morbidities. Hospital course and current clinical status was reviewed.   Patient's oncology course was reviewed: Initial diagnosis in January 2022, radiation treatment 07/15/20 to 08/05/20, status post 5 cycles of chemotherapy (last in March 2022), followed by immunotherapy that was discontinued secondary to intolerance and hospital admission with sepsis.  Reviewed that patient has been on observation therapy since July 2022.  Also reviewed that most recent CT 12/27/2023 showed no evidence of recurrence.  Discussed patient's current issue of left pleural effusion, with concern for infection versus malignant. She is obviously hopeful this is not cancer recurrence . We also discussed patient's additional issue of small bowel obstruction.  Reviewed that per surgery progress notes, she seems to be improving with NG tube to suction, although no BM or flatulence yet. Discussed hope that SBO will resolve with conservative management.   Discussed the importance of continued conversation with the medical team regarding overall plan of care and treatment options, ensuring decisions are within the context of the patients values and GOCs.  Questions and concerns addressed. Emotional support provided.   Review of Systems  Unable to perform ROS   Objective:   Primary Diagnoses: Present on Admission:  Adenocarcinoma of left lung, stage 3 (HCC)  Alzheimer's disease (HCC)  Asthma, severe persistent, well-controlled (HCC)  Constipation  GERD (gastroesophageal reflux disease)  HLD (hyperlipidemia)  HTN (hypertension)  Hypothyroidism  Pleural effusion associated with pulmonary infection vs. recurrent malignancy and sepsis   Physical Exam Vitals reviewed.  Constitutional:       General: She is not in acute distress.    Comments: Chronically ill-appearing  HENT:     Nose:     Comments: NGT to suction Pulmonary:     Comments: L chest tube Neurological:     Mental Status: She is lethargic.     Palliative Assessment/Data: PPS 40%     Assessment & Plan:   SUMMARY OF RECOMMENDATIONS   Continue full scope care Pending pleural fluid cytology results  PMT will continue to follow  Primary Decision Maker: HCPOA  Existing Vynca/ACP Documentation: HCPOA document naming Angelica [name] (daughter) as primary healthcare agent and Angelica [name], [name]. as alternate  Code Status/Advance Care Planning: Full code  (code status was not discussed today)  Symptom Management:  Per attending  Prognosis:  Unable to determine  Discharge Planning:  To Be Determined    Thank you for allowing us  to participate in the care of Angelica [name]   Time Total: 76 minutes  Detailed review of medical records (labs, imaging, vital signs), medically appropriate exam, discussed with treatment team, counseling and education to patient, family, & staff, documenting clinical information, coordination of care.   Signed by: Angelica Loll, NP Palliative Medicine Team  Team Phone # [phone]  For individual providers, please see AMION

## 2024-04-02 NOTE — Progress Notes (Signed)
 Progress Note  1 Day Post-Op  Subjective: Patient's daughter is at bedside and helps provide history.  Patient states that her abdominal pain is better since placement of NGT. No BM or flatulence. Denies nausea and vomiting. Currently NPO.  ROS  All negative with the exception of above.  Objective: Vital signs in last 24 hours: Temp:  [97.4 F (36.3 C)-99.1 F (37.3 C)] 97.4 F (36.3 C) (11/13 0618) Pulse Rate:  [84-102] 97 (11/13 0731) Resp:  [17-20] 18 (11/13 0731) BP: (135-169)/(83-110) 148/88 (11/13 0618) SpO2:  [93 %-99 %] 94 % (11/13 0731) Last BM Date :  (pta)  Intake/Output from previous day: 11/12 0701 - 11/13 0700 In: 1034.7 [P.O.:190; IV Piggyback:824.7] Out: 1777 [Urine:500; Emesis/NG output:775; Chest Tube:502] Intake/Output this shift: Total I/O In: 0  Out: 370 [Chest Tube:370]  PE: General: Pleasant female who is sitting in chair at bedside in NAD. HEENT: Head is normocephalic, atraumatic. NGT in place. Heart: Normal HR during encounter.  Lungs: Respiratory effort nonlabored on room air. Breath sounds on left side diminished.  Abd: Soft with distention. No significant tenderness. No rebound tenderness or guarding. Scars noted from previous surgeries. Skin: Warm and dry    Lab Results:  Recent Labs    04/01/24 0404 04/02/24 0412  WBC 18.6* 16.2*  HGB 11.6* 13.5  HCT 35.0* 41.3  PLT 425* 461*   BMET Recent Labs    04/01/24 0404 04/02/24 0412  NA 137 139  K 3.2* 4.1  CL 103 105  CO2 19* 17*  GLUCOSE 93 124*  BUN 17 24*  CREATININE 0.63 0.75  CALCIUM  9.6 10.0   PT/INR No results for input(s): LABPROT, INR in the last 72 hours.  CMP     Component Value Date/Time   NA 139 04/02/2024 0412   K 4.1 04/02/2024 0412   CL 105 04/02/2024 0412   CO2 17 (L) 04/02/2024 0412   GLUCOSE 124 (H) 04/02/2024 0412   BUN 24 (H) 04/02/2024 0412   CREATININE 0.75 04/02/2024 0412   CREATININE 0.96 12/27/2023 0757   CREATININE 0.86 11/02/2022  1604   CALCIUM  10.0 04/02/2024 0412   PROT 7.4 03/29/2024 0926   PROT 7.7 06/07/2017 0940   ALBUMIN  3.2 (L) 03/29/2024 0926   AST 19 03/29/2024 0926   AST 24 12/27/2023 0757   ALT 10 03/29/2024 0926   ALT 15 12/27/2023 0757   ALKPHOS 77 03/29/2024 0926   BILITOT 0.7 03/29/2024 0926   BILITOT 0.7 12/27/2023 0757   GFRNONAA >60 04/02/2024 0412   GFRNONAA 58 (L) 12/27/2023 0757   GFRAA >60 08/15/2017 0012   Lipase     Component Value Date/Time   LIPASE 30 10/09/2023 1042       Studies/Results: DG Abd Portable 1V Result Date: 04/02/2024 CLINICAL DATA:  Small bowel obstruction. EXAM: PORTABLE ABDOMEN - 1 VIEW COMPARISON:  1 day prior FINDINGS: Markedly dilated opacified small bowel loops in the central abdomen and upper pelvis are similar to prior. The does appear to be some gas in the transverse colon and splenic flexure although no colonic contrast evident. NG tube remains in place with catheter either folded back on itself in the gastric antrum or potentially with the tip of the NG tube in a transpyloric position in the proximal duodenum. Left base collapse/consolidation with effusion evident with incomplete visualization of the left chest tube. IMPRESSION: 1. Markedly dilated opacified small bowel loops in the central abdomen and upper pelvis are similar to prior. There does appear to  be some gas in the transverse colon and splenic flexure although no colonic contrast evident. Imaging features remain compatible with small bowel obstruction. 2. NG tube remains in place with catheter either folded back on itself in the gastric antrum or potentially with the tip of the NG tube in a transpyloric position in the proximal duodenum. Electronically Signed   By: Camellia Candle M.D.   On: 04/02/2024 08:32   DG CHEST PORT 1 VIEW Result Date: 04/02/2024 CLINICAL DATA:  Chest tube. EXAM: PORTABLE CHEST 1 VIEW COMPARISON:  04/01/2024 FINDINGS: 0752 hours. Interval improvement in aeration of the left  lung with decreased opacity over the left mid and lower hemithorax. Left chest tube remains in place without discernible left-sided pneumothorax. Left base collapse/consolidation with small effusion evident. Right lung clear. Cardiopericardial silhouette is at upper limits of normal for size. The NG tube passes into the stomach although the distal tip position is not included on the film. Telemetry leads overlie the chest. IMPRESSION: 1. Interval improvement in aeration of the left lung with decreased opacity over the left mid and lower hemithorax. 2. Left base collapse/consolidation with small effusion. Electronically Signed   By: Camellia Candle M.D.   On: 04/02/2024 08:21   DG Abd Portable 1V-Small Bowel Obstruction Protocol-initial, 8 hr delay Result Date: 04/01/2024 CLINICAL DATA:  Bowel obstruction EXAM: PORTABLE ABDOMEN - 1 VIEW COMPARISON:  03/31/2024, CT 03/31/2024, CT 03/29/2024 FINDINGS: Enteric tube is folded back upon itself distally in the region of the distal stomach with the tube tip directed to the left, cannot exclude kink in the catheter. Contrast within the urinary bladder. Dilute contrast within markedly dilated small bowel, the small bowel is dilated up to 7.8 cm. No contrast in the colon IMPRESSION: 1. Enteric tube is folded back upon itself distally in the region of the distal stomach with the tube tip directed to the left, cannot exclude kink in the catheter. 2. Dilute contrast within markedly dilated small bowel consistent with small bowel obstruction, closed loop physiology not excluded. No contrast in the colon. Electronically Signed   By: Luke Bun M.D.   On: 04/01/2024 20:20   DG CHEST PORT 1 VIEW Result Date: 04/01/2024 CLINICAL DATA:  Post chest tube placement. EXAM: PORTABLE CHEST 1 VIEW COMPARISON:  03/29/2024 FINDINGS: Interval placement of left-sided chest tube over the mid to lower thorax. Enteric tube courses to the region of the stomach and has tip over the midline  which 2 within the distal stomach versus duodenum. No significant change in opacification over approximately 2/3 of the left hemithorax compatible with known large effusion and associated basilar compressive atelectasis. No pneumothorax. Right lung is clear. Under the exam is unchanged. IMPRESSION: 1. Interval placement of left-sided chest tube over the mid to lower thorax. No pneumothorax. 2. No significant change in opacification over approximately 2/3 of the left hemithorax compatible with known large effusion and associated basilar compressive atelectasis. Electronically Signed   By: Toribio Agreste M.D.   On: 04/01/2024 15:53   CT ABDOMEN PELVIS W CONTRAST Result Date: 03/31/2024 EXAM: CT ABDOMEN AND PELVIS WITH CONTRAST 03/31/2024 07:48:30 PM TECHNIQUE: CT of the abdomen and pelvis was performed with the administration of intravenous contrast. Multiplanar reformatted images are provided for review. Automated exposure control, iterative reconstruction, and/or weight-based adjustment of the mA/kV was utilized to reduce the radiation dose to as low as reasonably achievable. 100 mL of iohexol  (OMNIPAQUE ) 300 MG/ML solution was administered intravenously. COMPARISON: CT Abdomen and Pelvis dated 03/29/2024. Clinical  history for the comparison study is abdominal distention, possible obstruction. CLINICAL HISTORY: Bowel obstruction suspected. FINDINGS: LOWER CHEST: Small right-sided pleural effusion is noted. No focal infiltrate is seen. Left lower lobe consolidation is again identified and stable. Large loculated appearing left pleural effusion is again seen. LIVER: The liver is within normal limits. GALLBLADDER AND BILE DUCTS: The gallbladder is decompressed. No biliary ductal dilatation. SPLEEN: The spleen is unremarkable. PANCREAS: The pancreas is within normal limits. ADRENAL GLANDS: The adrenal glands are within normal limits. KIDNEYS, URETERS AND BLADDER: The kidneys demonstrate a normal enhancement pattern  bilaterally. No renal calculi or obstructive changes are seen. No perinephric or periureteral stranding. The bladder is well distended with partially opacified urine from prior CT examination. GI AND BOWEL: The stomach is decompressed by a gastric catheter. The proximal small bowel, however, just beyond the ligament of Treitz shows multiple significantly dilated loops of small bowel up to at least 6 cm in diameter. Transition zones are identified just beyond the ligament of Treitz best seen on image number 40 of series 2 and image number 65 of series 6 proximally with a transition zone distally best seen on image number 40 of series 2 and image number 53 of series 6. This is consistent with small bowel obstruction and these are likely related to adhesions from prior surgery. The more distal small bowel is decompressed. The colon shows no obstructive or inflammatory changes. Scattered fecal material is seen. PERITONEUM AND RETROPERITONEUM: No free fluid is seen. No free air. VASCULATURE: Mild atherosclerotic calcifications of the aorta are noted without aneurysmal dilatation. LYMPH NODES: No lymphadenopathy. REPRODUCTIVE ORGANS: The uterus has been surgically removed. BONES AND SOFT TISSUES: No acute osseous abnormality. IMPRESSION: 1. Small bowel obstruction with proximal transition point just beyond the ligament of Treitz, likely due to adhesions from prior surgery. 2. Stable large loculated left pleural effusion with unchanged left lower lobe consolidation. 3. Small right pleural effusion. Electronically signed by: Oneil Devonshire MD 03/31/2024 08:18 PM EST RP Workstation: [id number]   DG Abd 1 View Result Date: 03/31/2024 CLINICAL DATA:  Enteric catheter placement EXAM: ABDOMEN - 1 VIEW COMPARISON:  03/31/2024 at 12:23 p.m. FINDINGS: Supine frontal view of the lower chest and upper abdomen demonstrates enteric catheter passing below diaphragm, tip and side port coiled over the gastric body. Persistent gaseous  distension of the small bowel consistent with obstruction. Stable eventration of the left hemidiaphragm. IMPRESSION: 1. Enteric catheter tip and side port projecting over the gastric body. 2. Stable small bowel obstruction. Electronically Signed   By: Ozell Daring M.D.   On: 03/31/2024 15:44   DG Abd 1 View Result Date: 03/31/2024 CLINICAL DATA:  Nausea and vomiting. EXAM: ABDOMEN - 1 VIEW COMPARISON:  10/06/2022, CT 03/29/2024 FINDINGS: Interval change in bowel gas pattern with several air-filled dilated small bowel loops present. There are several air-filled prominent bowel loops in the central abdomen likely representing small bowel loops. There is air and stool over the rectum. No definite free peritoneal air. Remainder of the exam is unchanged. IMPRESSION: Interval change in bowel gas pattern with several air-filled dilated small bowel loops as well as several air-filled prominent bowel loops in the central abdomen likely representing small bowel loops. Findings likely due to early/partial small bowel obstruction and less likely ileus. Electronically Signed   By: Toribio Agreste M.D.   On: 03/31/2024 13:27    Anti-infectives: Anti-infectives (From admission, onward)    Start     Dose/Rate Route Frequency Ordered Stop  03/30/24 2200  vancomycin  (VANCOREADY) IVPB 750 mg/150 mL        750 mg 150 mL/hr over 60 Minutes Intravenous Every 24 hours 03/29/24 1620     03/30/24 0400  ceFEPIme  (MAXIPIME ) 2 g in sodium chloride  0.9 % 100 mL IVPB        2 g 200 mL/hr over 30 Minutes Intravenous Every 12 hours 03/29/24 1620     03/29/24 1530  vancomycin  (VANCOCIN ) IVPB 1000 mg/200 mL premix        1,000 mg 200 mL/hr over 60 Minutes Intravenous  Once 03/29/24 1517 03/30/24 0228   03/29/24 1530  ceFEPIme  (MAXIPIME ) 2 g in sodium chloride  0.9 % 100 mL IVPB        2 g 200 mL/hr over 30 Minutes Intravenous  Once 03/29/24 1518 03/29/24 1724        Assessment/Plan Nausea, vomiting, SBO  - Original CT  from 11/9 showed no intra-abdominal pathology.  - CT from 11/11 shows small bowel obstruction with proximal transition point just beyond the ligament of Treitz, likely due to adhesions from prior surgery. Stable large loculated left pleural effusion with unchanged left lower lobe consolidation. Small right pleural effusion. -Afebrile.  -WBC decreased to 16 (down-trending); afebrile -Abdomen soft but with distention. Having generalized pain that she feels has improved since NGT placement. No n/v. -NGT output cannister has 400 mL of output. -Maintain NGT and NPO.  -Hopefully, there is resolution with non-operative management. I did have a brief discussion with daughter who is power of attorney about risk and benefits of surgery should this not self resolve.  - Goals of care discussions with palliative recommended  -Will continue to follow. SBO may be improving as gas now seen in colon. Monitor today.  --PCCM following s/p chest tube placement  FEN - NPO/NGT/IVFs per [facility] VTE - Plavix  on hold, lovenox  ID - Vanc, maxipime    HTN HLD h/o CVA Alzheimer's disease (although oriented currently) Stage III adenocarcinoma of lung GERD hypothyroidism   LOS: 4 days   I reviewed nursing notes, specialist notes, hospitalist notes, last 24 h vitals and pain scores, last 48 h intake and output, last 24 h labs and trends, and last 24 h imaging results.  This care required moderate level of medical decision making.    Lonni CHRISTELLA Pizza, MD  [facility] 04/02/2024, 9:28 AM Please see Amion for pager number during day hours 7:00am-4:30pm

## 2024-04-03 ENCOUNTER — Inpatient Hospital Stay (HOSPITAL_COMMUNITY): Admitting: Anesthesiology

## 2024-04-03 ENCOUNTER — Encounter (HOSPITAL_COMMUNITY)
Admission: EM | Disposition: A | Discharge status: Skilled Nursing Facility | Payer: Self-pay | Source: Home / Self Care | Attending: Internal Medicine

## 2024-04-03 ENCOUNTER — Inpatient Hospital Stay (HOSPITAL_COMMUNITY)

## 2024-04-03 ENCOUNTER — Encounter (HOSPITAL_COMMUNITY): Payer: Self-pay | Admitting: Pulmonary Disease

## 2024-04-03 DIAGNOSIS — I10 Essential (primary) hypertension: Secondary | ICD-10-CM

## 2024-04-03 DIAGNOSIS — J9601 Acute respiratory failure with hypoxia: Secondary | ICD-10-CM | POA: Diagnosis not present

## 2024-04-03 DIAGNOSIS — K56609 Unspecified intestinal obstruction, unspecified as to partial versus complete obstruction: Secondary | ICD-10-CM | POA: Diagnosis not present

## 2024-04-03 DIAGNOSIS — K565 Intestinal adhesions [bands], unspecified as to partial versus complete obstruction: Secondary | ICD-10-CM

## 2024-04-03 DIAGNOSIS — J910 Malignant pleural effusion: Secondary | ICD-10-CM | POA: Diagnosis not present

## 2024-04-03 DIAGNOSIS — Z87891 Personal history of nicotine dependence: Secondary | ICD-10-CM | POA: Diagnosis not present

## 2024-04-03 DIAGNOSIS — J918 Pleural effusion in other conditions classified elsewhere: Secondary | ICD-10-CM | POA: Diagnosis not present

## 2024-04-03 DIAGNOSIS — J189 Pneumonia, unspecified organism: Secondary | ICD-10-CM | POA: Diagnosis not present

## 2024-04-03 DIAGNOSIS — C3492 Malignant neoplasm of unspecified part of left bronchus or lung: Secondary | ICD-10-CM | POA: Diagnosis not present

## 2024-04-03 DIAGNOSIS — Z515 Encounter for palliative care: Secondary | ICD-10-CM | POA: Diagnosis not present

## 2024-04-03 HISTORY — PX: LAPAROTOMY: SHX154

## 2024-04-03 LAB — CBC
HCT: 45 % (ref 36.0–46.0)
Hemoglobin: 15 g/dL (ref 12.0–15.0)
MCH: 29.8 pg (ref 26.0–34.0)
MCHC: 33.3 g/dL (ref 30.0–36.0)
MCV: 89.5 fL (ref 80.0–100.0)
Platelets: 497 K/uL — ABNORMAL HIGH (ref 150–400)
RBC: 5.03 MIL/uL (ref 3.87–5.11)
RDW: 13.9 % (ref 11.5–15.5)
WBC: 19.2 K/uL — ABNORMAL HIGH (ref 4.0–10.5)
nRBC: 0 % (ref 0.0–0.2)

## 2024-04-03 LAB — BASIC METABOLIC PANEL WITH GFR
Anion gap: 19 — ABNORMAL HIGH (ref 5–15)
BUN: 28 mg/dL — ABNORMAL HIGH (ref 8–23)
CO2: 19 mmol/L — ABNORMAL LOW (ref 22–32)
Calcium: 11 mg/dL — ABNORMAL HIGH (ref 8.9–10.3)
Chloride: 104 mmol/L (ref 98–111)
Creatinine, Ser: 0.81 mg/dL (ref 0.44–1.00)
GFR, Estimated: >60 mL/min (ref >60)
Glucose, Bld: 142 mg/dL — ABNORMAL HIGH (ref 70–99)
Potassium: 4.1 mmol/L (ref 3.5–5.1)
Sodium: 143 mmol/L (ref 135–145)

## 2024-04-03 LAB — CULTURE, BLOOD (ROUTINE X 2): Culture: NO GROWTH

## 2024-04-03 LAB — TYPE AND SCREEN
ABO/RH(D): O POS
Antibody Screen: NEGATIVE

## 2024-04-03 LAB — CYTOLOGY - NON PAP

## 2024-04-03 SURGERY — LAPAROTOMY, EXPLORATORY
Anesthesia: General

## 2024-04-03 MED ORDER — LACTATED RINGERS IV SOLN
INTRAVENOUS | Status: DC | PRN
Start: 2024-04-03 — End: 2024-04-03

## 2024-04-03 MED ORDER — ONDANSETRON HCL 4 MG/2ML IJ SOLN
INTRAMUSCULAR | Status: DC | PRN
  Administered 2024-04-03: 4 mg via INTRAVENOUS

## 2024-04-03 MED ORDER — ACETAMINOPHEN 10 MG/ML IV SOLN: 500.0000 mg | Freq: Once | INTRAVENOUS | Status: DC | PRN

## 2024-04-03 MED ORDER — AMLODIPINE BESYLATE 10 MG PO TABS
10.0000 mg | ORAL_TABLET | Freq: Every day | ORAL | Status: DC
  Filled 2024-04-03: qty 1

## 2024-04-03 MED ORDER — MEMANTINE HCL 5 MG PO TABS
10.0000 mg | ORAL_TABLET | Freq: Two times a day (BID) | ORAL | Status: DC
  Administered 2024-04-04 – 2024-04-06 (×6): 10 mg
  Filled 2024-04-03 (×6): qty 2

## 2024-04-03 MED ORDER — FENTANYL CITRATE (PF) 100 MCG/2ML IJ SOLN
INTRAMUSCULAR | Status: DC | PRN
  Administered 2024-04-03 (×2): 50 ug via INTRAVENOUS

## 2024-04-03 MED ORDER — 0.9 % SODIUM CHLORIDE (POUR BTL) OPTIME
TOPICAL | Status: DC | PRN
  Administered 2024-04-03: 2000 mL

## 2024-04-03 MED ORDER — OXYCODONE HCL 5 MG PO TABS: 5.0000 mg | ORAL_TABLET | Freq: Once | ORAL | Status: DC | PRN

## 2024-04-03 MED ORDER — OXYCODONE HCL 5 MG/5ML PO SOLN: 5.0000 mg | Freq: Once | ORAL | Status: DC | PRN

## 2024-04-03 MED ORDER — LACTATED RINGERS IV SOLN: INTRAVENOUS | Status: DC

## 2024-04-03 MED ORDER — PRAVASTATIN SODIUM 20 MG PO TABS
80.0000 mg | ORAL_TABLET | Freq: Every day | ORAL | Status: DC
  Administered 2024-04-04 – 2024-04-10 (×6): 80 mg
  Filled 2024-04-03 (×7): qty 4

## 2024-04-03 MED ORDER — PHENYLEPHRINE HCL (PRESSORS) 10 MG/ML IV SOLN
INTRAVENOUS | Status: DC | PRN
  Administered 2024-04-03: 160 ug via INTRAVENOUS
  Administered 2024-04-03: 320 ug via INTRAVENOUS

## 2024-04-03 MED ORDER — VASOPRESSIN 20 UNIT/ML IV SOLN
INTRAVENOUS | Status: AC
  Filled 2024-04-03: qty 1

## 2024-04-03 MED ORDER — CHLORHEXIDINE GLUCONATE 0.12 % MT SOLN: 15.0000 mL | Freq: Once | OROMUCOSAL | Status: DC

## 2024-04-03 MED ORDER — CHLORHEXIDINE GLUCONATE CLOTH 2 % EX PADS
6.0000 | MEDICATED_PAD | Freq: Every day | CUTANEOUS | Status: DC
  Administered 2024-04-03 – 2024-04-05 (×2): 6 via TOPICAL

## 2024-04-03 MED ORDER — METHOCARBAMOL 1000 MG/10ML IJ SOLN: 500.0000 mg | Freq: Three times a day (TID) | INTRAMUSCULAR | Status: DC | PRN

## 2024-04-03 MED ORDER — PHENYLEPHRINE HCL-NACL 20-0.9 MG/250ML-% IV SOLN
INTRAVENOUS | Status: DC | PRN
  Administered 2024-04-03: 15 ug/min via INTRAVENOUS

## 2024-04-03 MED ORDER — LORATADINE 10 MG PO TABS
10.0000 mg | ORAL_TABLET | Freq: Every day | ORAL | Status: DC
  Administered 2024-04-04 – 2024-04-06 (×3): 10 mg
  Filled 2024-04-03 (×3): qty 1

## 2024-04-03 MED ORDER — ROCURONIUM BROMIDE 100 MG/10ML IV SOLN
INTRAVENOUS | Status: DC | PRN
  Administered 2024-04-03: 40 mg via INTRAVENOUS
  Administered 2024-04-03: 10 mg via INTRAVENOUS

## 2024-04-03 MED ORDER — FENTANYL CITRATE (PF) 50 MCG/ML IJ SOSY
25.0000 ug | PREFILLED_SYRINGE | INTRAMUSCULAR | Status: DC | PRN
  Administered 2024-04-03 (×3): 25 ug via INTRAVENOUS

## 2024-04-03 MED ORDER — SUGAMMADEX SODIUM 200 MG/2ML IV SOLN
INTRAVENOUS | Status: DC | PRN
  Administered 2024-04-03: 200 mg via INTRAVENOUS

## 2024-04-03 MED ORDER — SUCCINYLCHOLINE CHLORIDE 200 MG/10ML IV SOSY
PREFILLED_SYRINGE | INTRAVENOUS | Status: DC | PRN
  Administered 2024-04-03: 100 mg via INTRAVENOUS

## 2024-04-03 MED ORDER — DROPERIDOL 2.5 MG/ML IJ SOLN: 0.6250 mg | Freq: Once | INTRAMUSCULAR | Status: DC | PRN

## 2024-04-03 MED ORDER — ONDANSETRON HCL 4 MG/2ML IJ SOLN
INTRAMUSCULAR | Status: AC
  Filled 2024-04-03: qty 2

## 2024-04-03 MED ORDER — SUCCINYLCHOLINE CHLORIDE 200 MG/10ML IV SOSY
PREFILLED_SYRINGE | INTRAVENOUS | Status: AC
  Filled 2024-04-03: qty 10

## 2024-04-03 MED ORDER — ORAL CARE MOUTH RINSE: 15.0000 mL | Freq: Once | OROMUCOSAL | Status: DC

## 2024-04-03 MED ORDER — ENOXAPARIN SODIUM 40 MG/0.4ML IJ SOSY
40.0000 mg | PREFILLED_SYRINGE | INTRAMUSCULAR | Status: DC
  Administered 2024-04-04 – 2024-04-09 (×6): 40 mg via SUBCUTANEOUS
  Filled 2024-04-03 (×6): qty 0.4

## 2024-04-03 MED ORDER — METRONIDAZOLE 500 MG/100ML IV SOLN
500.0000 mg | INTRAVENOUS | Status: AC
  Administered 2024-04-03: 500 mg via INTRAVENOUS
  Filled 2024-04-03: qty 100

## 2024-04-03 MED ORDER — ESMOLOL HCL 100 MG/10ML IV SOLN
INTRAVENOUS | Status: DC | PRN
  Administered 2024-04-03: 20 mg via INTRAVENOUS

## 2024-04-03 MED ORDER — FENTANYL CITRATE (PF) 50 MCG/ML IJ SOSY
PREFILLED_SYRINGE | INTRAMUSCULAR | Status: AC
  Filled 2024-04-03: qty 2

## 2024-04-03 MED ORDER — PROPOFOL 10 MG/ML IV BOLUS
INTRAVENOUS | Status: DC | PRN
  Administered 2024-04-03: 70 mg via INTRAVENOUS

## 2024-04-03 MED ORDER — LIDOCAINE HCL (CARDIAC) PF 100 MG/5ML IV SOSY
PREFILLED_SYRINGE | INTRAVENOUS | Status: DC | PRN
  Administered 2024-04-03: 40 mg via INTRAVENOUS

## 2024-04-03 MED ORDER — FENTANYL CITRATE (PF) 250 MCG/5ML IJ SOLN
INTRAMUSCULAR | Status: AC
  Filled 2024-04-03: qty 5

## 2024-04-03 MED ORDER — STERILE WATER FOR IRRIGATION IR SOLN
Status: DC | PRN
  Administered 2024-04-03: 1000 mL

## 2024-04-03 MED ORDER — MORPHINE SULFATE (PF) 2 MG/ML IV SOLN
2.0000 mg | INTRAVENOUS | Status: DC | PRN
  Administered 2024-04-04 – 2024-04-08 (×18): 2 mg via INTRAVENOUS
  Administered 2024-04-09: 1 mg via INTRAVENOUS
  Administered 2024-04-09 – 2024-04-17 (×36): 2 mg via INTRAVENOUS
  Filled 2024-04-03 (×59): qty 1

## 2024-04-03 MED ORDER — ACETAMINOPHEN 10 MG/ML IV SOLN
1000.0000 mg | Freq: Four times a day (QID) | INTRAVENOUS | Status: AC
  Administered 2024-04-03 – 2024-04-04 (×4): 1000 mg via INTRAVENOUS
  Filled 2024-04-03 (×4): qty 100

## 2024-04-03 SURGICAL SUPPLY — 41 items
BAG COUNTER SPONGE SURGICOUNT (BAG) IMPLANT
BLADE EXTENDED COATED 6.5IN (ELECTRODE) ×1 IMPLANT
BNDG GAUZE DERMACEA FLUFF 4 (GAUZE/BANDAGES/DRESSINGS) IMPLANT
CHLORAPREP W/TINT 26 (MISCELLANEOUS) ×1 IMPLANT
COVER MAYO STAND STRL (DRAPES) ×1 IMPLANT
DRAIN CHANNEL 19F RND (DRAIN) IMPLANT
DRAPE LAPAROSCOPIC ABDOMINAL (DRAPES) ×1 IMPLANT
DRAPE SHEET LG 3/4 BI-LAMINATE (DRAPES) IMPLANT
DRAPE UTILITY XL STRL (DRAPES) ×1 IMPLANT
DRAPE WARM FLUID 44X44 (DRAPES) ×1 IMPLANT
ELECT REM PT RETURN 15FT ADLT (MISCELLANEOUS) ×1 IMPLANT
EVACUATOR SILICONE 100CC (DRAIN) IMPLANT
GAUZE PAD ABD 8X10 STRL (GAUZE/BANDAGES/DRESSINGS) IMPLANT
GAUZE SPONGE 4X4 12PLY STRL (GAUZE/BANDAGES/DRESSINGS) ×1 IMPLANT
GLOVE ECLIPSE 8.0 STRL XLNG CF (GLOVE) ×2 IMPLANT
GLOVE INDICATOR 8.0 STRL GRN (GLOVE) ×1 IMPLANT
GOWN STRL REUS W/ TWL XL LVL3 (GOWN DISPOSABLE) ×2 IMPLANT
HANDLE SUCTION POOLE (INSTRUMENTS) ×1 IMPLANT
KIT BASIN OR (CUSTOM PROCEDURE TRAY) ×1 IMPLANT
KIT TURNOVER KIT A (KITS) ×1 IMPLANT
LEGGING LITHOTOMY PAIR STRL (DRAPES) IMPLANT
LIGASURE IMPACT 36 18CM CVD LR (INSTRUMENTS) IMPLANT
PACK GENERAL/GYN (CUSTOM PROCEDURE TRAY) ×1 IMPLANT
PAD POSITIONING PINK XL (MISCELLANEOUS) ×1 IMPLANT
RELOAD STAPLE 75 3.8 BLU REG (ENDOMECHANICALS) IMPLANT
SPONGE T-LAP 18X18 ~~LOC~~+RFID (SPONGE) IMPLANT
STAPLER 90 3.5 STD SLIM (STAPLE) IMPLANT
STAPLER PROXIMATE 75MM BLUE (STAPLE) IMPLANT
STAPLER SKIN PROX 35W (STAPLE) ×1 IMPLANT
SUT ETHILON 3 0 PS 1 (SUTURE) IMPLANT
SUT NOVA 1 T20/GS 25DT (SUTURE) IMPLANT
SUT PDS AB 1 TP1 96 (SUTURE) IMPLANT
SUT SILK 2 0 SH CR/8 (SUTURE) ×1 IMPLANT
SUT SILK 2-0 18XBRD TIE 12 (SUTURE) ×1 IMPLANT
SUT SILK 3 0 SH CR/8 (SUTURE) ×1 IMPLANT
SUT SILK 3-0 18XBRD TIE 12 (SUTURE) ×1 IMPLANT
SUT VIC AB 2-0 SH 18 (SUTURE) IMPLANT
SUT VIC AB 3-0 SH 18 (SUTURE) IMPLANT
TOWEL OR DSP ST BLU DLX 10/PK (DISPOSABLE) ×2 IMPLANT
TRAY FOLEY MTR SLVR 14FR STAT (SET/KITS/TRAYS/PACK) IMPLANT
TRAY FOLEY MTR SLVR 16FR STAT (SET/KITS/TRAYS/PACK) IMPLANT

## 2024-04-03 NOTE — Progress Notes (Signed)
 NAME:  NARE GASPARI, MRN:  [id number], DOB:  1940-09-07, LOS: 5 ADMISSION DATE:  03/29/2024, CONSULTATION DATE:  04/03/24 REFERRING MD:  Dr. Christobal, CHIEF COMPLAINT:  Pleural Effusion   History of Present Illness:   Ms. Dabbs is an 83 y/o F with a PMH significant for GERD, HTN, HLD, stage 3B NSCLC, hypothyroidism, Alzheimer's dementia who presented for weakness and shortness of breath now found to have a large loculated left pleural effusion. Pulmonology consulted for evaluation and management of the latter.  History mostly obtained from discussion with daughter and chart review. Per daughter, patient developing worsening shortness of breath to the point she was unable to walk without getting really winded. She also noted some chest pressure/pain. Daughter called 911. The patient was brought into the ED and CT C/A/P showed a large left sided loculated pleural effusion.   Of note, patient previously had thoracentesis on left side 10/07/2022. Cytology negative for malignancy at that time. Lymphocytic effusion. LDH low at 71. Protein 3.2.  Pertinent  Medical History   Past Medical History:  Diagnosis Date   Allergy 05/21/1978   Arthritis    Asthma    Bowel obstruction (HCC)    Cancer (HCC)    Cataract    Environmental allergies    GERD (gastroesophageal reflux disease)    Glaucoma 05/09/2017   H. pylori infection    History of radiation therapy 07/05/20-08/05/20   Left lung IMRT Dr. Shannon    HLD (hyperlipidemia) 01/16/2019   Hypertension    Iron  deficiency anemia    Osteopenia 05/26/2017   Thyroid  disease    Urticaria 07/25/2018   Significant Hospital Events: Including procedures, antibiotic start and stop dates in addition to other pertinent events   Patient admitted to [facility], plavix  dose given on 11/10, discontinued plavix  order. 03/31/2024, diagnosed with SBO, placed NGT to LIS, CT ABD/PLV shows SBO with proximal transition point beyond ligament of Treitz. CT re-demonstrates left  pleural effusion. 04/01/24 --> pigtail chest tube placed, first dose of TPA/Dornase, 1500 mL out since procedure, no air leak. 04/02/24 --> second dose of TPA/Dornase given, 1000 mL out, no air leak  Interim History / Subjective:  Has abdominal pain due to SBO. No pain with chest tube. Chest tube output 1095 mL  Objective    Blood pressure (!) 155/92, pulse 95, temperature 98.1 F (36.7 C), temperature source Oral, resp. rate 20, height 5' 5 (1.651 m), weight 59.6 kg, SpO2 98%.        Intake/Output Summary (Last 24 hours) at 04/03/2024 1111 Last data filed at 04/03/2024 9380 Gross per 24 hour  Intake 271.63 ml  Output 490 ml  Net -218.37 ml   Filed Weights   03/29/24 1600 03/29/24 2345 03/30/24 0626  Weight: 59 kg 59.6 kg 59.6 kg    Examination: General: appears uncomfortable, holding abdomen due to pain, moving around due to discomfort Eyes: PERRL CV: systolic murmur, no edema Resp: CTAB, CT on - 20 suction, no air leak Abdom: distended and tender to palpation diffusely Neuro: Awake, alert, non-conversant due to pain, follows commands.   Resolved problem list   Assessment and Plan   #Large Loculated Pleural Effusion: > Unclear etiology. Ddx includes infectious etiology versus malignant. Suspect maybe infectious given cell count has 2600 WBCs, 90% PMNs and pleural fluid LDH is 454 suggestive of complicated parapneumonic effusion. Pleural culture without growth to date. Cytology is still in process. CXR reviewed this AM and shows complete resolution of left pleural effusion. - Keep  CT to - 20 suction - Daily CXR - Will not need intrapleural fibrinolytics anymore given lack of any pleural effusion on imaging - FU pleural fluid output. Once pleural fluid output < 150 mL in 24 hours, will remove CT. - Antimicrobial plan per primary medical team.  #Hx of CVA: - Continue to hold plavix .  #SBO: - Plan per surgery. - Continue NGT to LIS  Labs   CBC: Recent Labs  Lab  03/30/24 0426 03/31/24 0414 04/01/24 0404 04/02/24 0412 04/03/24 0445  WBC 24.0* 23.0* 18.6* 16.2* 19.2*  HGB 12.6 11.9* 11.6* 13.5 15.0  HCT 39.8 35.9* 35.0* 41.3 45.0  MCV 90.9 90.9 90.4 92.0 89.5  PLT 425* 413* 425* 461* 497*    Basic Metabolic Panel: Recent Labs  Lab 03/29/24 1835 03/30/24 0426 03/31/24 0414 04/01/24 0404 04/02/24 0412 04/03/24 0445  NA  --  138 136 137 139 143  K  --  3.5 3.5 3.2* 4.1 4.1  CL  --  104 103 103 105 104  CO2  --  22 19* 19* 17* 19*  GLUCOSE  --  121* 108* 93 124* 142*  BUN  --  10 15 17  24* 28*  CREATININE  --  0.65 0.65 0.63 0.75 0.81  CALCIUM   --  9.6 9.5 9.6 10.0 11.0*  MG 2.0  --   --  2.2  --   --    GFR: Estimated Creatinine Clearance: 46.5 mL/min (by C-G formula based on SCr of 0.81 mg/dL). Recent Labs  Lab 03/29/24 1216 03/29/24 1835 03/30/24 0426 03/31/24 0414 04/01/24 0404 04/02/24 0412 04/03/24 0445  PROCALCITON  --  0.76 0.74  --   --   --   --   WBC  --   --  24.0* 23.0* 18.6* 16.2* 19.2*  LATICACIDVEN 2.1*  --   --   --   --   --   --     Liver Function Tests: Recent Labs  Lab 03/29/24 0926  AST 19  ALT 10  ALKPHOS 77  BILITOT 0.7  PROT 7.4  ALBUMIN  3.2*   No results for input(s): LIPASE, AMYLASE in the last 168 hours. No results for input(s): AMMONIA in the last 168 hours.  ABG    Component Value Date/Time   TCO2 25 08/22/2016 1818     Coagulation Profile: Recent Labs  Lab 03/29/24 1835  INR 1.2    Cardiac Enzymes: No results for input(s): CKTOTAL, CKMB, CKMBINDEX, TROPONINI in the last 168 hours.  HbA1C: Hgb A1c MFr Bld  Date/Time Value Ref Range Status  07/18/2021 08:35 AM 5.8 4.6 - 6.5 % Final    Comment:    Glycemic Control Guidelines for People with Diabetes:Non Diabetic:  <6%Goal of Therapy: <7%Additional Action Suggested:  >8%   05/03/2020 05:07 PM 5.8 4.6 - 6.5 % Final    Comment:    Glycemic Control Guidelines for People with Diabetes:Non Diabetic:  <6%Goal  of Therapy: <7%Additional Action Suggested:  >8%     CBG: Recent Labs  Lab 03/29/24 0958  GLUCAP 110*    Review of Systems:   Not obtained  Past Medical History:  She,  has a past medical history of Allergy (05/21/1978), Arthritis, Asthma, Bowel obstruction (HCC), Cancer (HCC), Cataract, Environmental allergies, GERD (gastroesophageal reflux disease), Glaucoma (05/09/2017), H. pylori infection, History of radiation therapy (07/05/20-08/05/20), HLD (hyperlipidemia) (01/16/2019), Hypertension, Iron  deficiency anemia, Osteopenia (05/26/2017), Thyroid  disease, and Urticaria (07/25/2018).   Surgical History:   Past Surgical History:  Procedure Laterality  Date   ABDOMINAL HYSTERECTOMY     BOWEL RESECTION N/A 05/23/2020   Procedure: SMALL BOWEL REPAIR;  Surgeon: Kinsinger, Herlene Righter, MD;  Location: [facility] OR;  Service: General;  Laterality: N/A;   BREAST BIOPSY     BRONCHIAL BIOPSY  06/10/2020   Procedure: BRONCHIAL BIOPSIES;  Surgeon: Brenna Adine CROME, DO;  Location: [facility];  Service: Pulmonary;;   BRONCHIAL BRUSHINGS  06/10/2020   Procedure: BRONCHIAL BRUSHINGS;  Surgeon: Brenna Adine CROME, DO;  Location: [facility];  Service: Pulmonary;;   BRONCHIAL NEEDLE ASPIRATION BIOPSY  06/10/2020   Procedure: BRONCHIAL NEEDLE ASPIRATION BIOPSIES;  Surgeon: Brenna Adine CROME, DO;  Location: [facility];  Service: Pulmonary;;   BRONCHIAL WASHINGS  06/10/2020   Procedure: BRONCHIAL WASHINGS;  Surgeon: Brenna Adine CROME, DO;  Location: [facility];  Service: Pulmonary;;   CHEST TUBE INSERTION Left 04/01/2024   Procedure: CHEST TUBE INSERTION;  Surgeon: Catherine Cools, MD;  Location: [facility];  Service: Pulmonary;  Laterality: Left;   COLON SURGERY     DIAGNOSTIC LARYNGOSCOPY N/A 05/23/2020   Procedure: ATTEMPTED DIAGNOSTIC LAPAROSCOPY WITH ADHESIONS;  Surgeon: Kinsinger, Herlene Righter, MD;  Location: [facility];  Service: General;  Laterality: N/A;   EYE SURGERY     IR IMAGING GUIDED PORT  INSERTION  07/15/2020   IR REMOVAL TUN ACCESS W/ PORT W/O FL MOD SED  01/16/2023   KNEE SURGERY     LAPAROTOMY N/A 04/18/2015   Procedure: Exploratory laparotomy with lysis of adhesions, possible bowel resection;  Surgeon: Lynda Leos, MD;  Location: [facility];  Service: General;  Laterality: N/A;   LAPAROTOMY N/A 05/23/2020   Procedure: EXPLORATORY LAPAROTOMY;  Surgeon: Stevie Herlene Righter, MD;  Location: [facility];  Service: General;  Laterality: N/A;   LYSIS OF ADHESION N/A 05/23/2020   Procedure: LYSIS OF ADHESION;  Surgeon: Stevie Herlene Righter, MD;  Location: [facility] OR;  Service: General;  Laterality: N/A;   NASAL SINUS SURGERY     SMALL INTESTINE SURGERY     THORACENTESIS Left 12/03/2022   Procedure: THORACENTESIS;  Surgeon: Brenna Adine CROME, DO;  Location: [facility];  Service: Pulmonary;  Laterality: Left;   THYROID  SURGERY     VIDEO BRONCHOSCOPY WITH ENDOBRONCHIAL NAVIGATION Bilateral 06/10/2020   Procedure: VIDEO BRONCHOSCOPY WITH ENDOBRONCHIAL NAVIGATION;  Surgeon: Brenna Adine CROME, DO;  Location: [facility];  Service: Pulmonary;  Laterality: Bilateral;   VIDEO BRONCHOSCOPY WITH ENDOBRONCHIAL ULTRASOUND  06/10/2020   Procedure: VIDEO BRONCHOSCOPY WITH ENDOBRONCHIAL ULTRASOUND;  Surgeon: Brenna Adine CROME, DO;  Location: [facility];  Service: Pulmonary;;     Social History:   reports that she quit smoking about 53 years ago. Her smoking use included cigarettes. She has been exposed to tobacco smoke. She has never used smokeless tobacco. She reports that she does not drink alcohol and does not use drugs.   Family History:  Her family history includes Diabetes in her mother; Glaucoma in her brother and sister; Hypertension in her father and mother. There is no history of Allergic rhinitis, Angioedema, Asthma, Eczema, Immunodeficiency, Urticaria, BRCA 1/2, or Breast cancer.   Allergies Allergies  Allergen Reactions   Asa Buff (Mag [Buffered Aspirin] Other (See Comments)     Excessive sweating   Ensure [Nutritional Supplements]     Or boost - upset stomach    Fish Allergy Other (See Comments)    Unknown reaction, but allergic   Other Other (See Comments)    Gelcaps; gel-containing capsules; extended-release medication - upset stomach    Peanut-Containing Drug  Products Other (See Comments)    From allergy test   Penicillins Itching   Shellfish Protein-Containing Drug Products Other (See Comments)    From allergy test   Strawberry Extract Nausea And Vomiting and Other (See Comments)    From allergy test      Home Medications  Prior to Admission medications   Medication Sig Start Date End Date Taking? Authorizing Provider  acetaminophen  (TYLENOL ) 500 MG tablet Take 1,000 mg by mouth every 6 (six) hours as needed for moderate pain.   Yes [provider]  albuterol  (VENTOLIN  HFA) 108 (90 Base) MCG/ACT inhaler Inhale 2 puffs into the lungs every 4 (four) hours as needed for wheezing or shortness of breath. 01/03/24  Yes Padgett, Danita Macintosh, MD  alum & mag hydroxide-simeth (MAALOX/MYLANTA) 200-200-20 MG/5ML suspension Take 15 mLs by mouth every 6 (six) hours as needed for indigestion or heartburn.   Yes [provider]  amLODipine  (NORVASC ) 10 MG tablet Take 1 tablet (10 mg total) by mouth daily. 06/17/23  Yes Norleen Lynwood ORN, MD  benralizumab  (FASENRA ) 30 MG/ML prefilled syringe Inject 1 mL (30 mg total) into the skin every 8 (eight) weeks. 07/15/23  Yes Padgett, Danita Macintosh, MD  cetirizine  [facility] ALLERGY RELIEF, CETIRIZINE ,) 10 MG tablet Take 1 tablet (10 mg total) by mouth daily. 01/03/24  Yes Jeneal Danita Macintosh, MD  clopidogrel  (PLAVIX ) 75 MG tablet Take 1 tablet by mouth once daily 07/18/23  Yes Norleen Lynwood ORN, MD  diclofenac  Sodium (VOLTAREN ) 1 % GEL Apply 4 g topically 4 (four) times daily. Patient taking differently: Apply 4 g topically 4 (four) times daily as needed (pain). 04/23/22  Yes Corey, Evan S, MD  EPINEPHrine  (EPIPEN  2-PAK)  0.3 mg/0.3 mL IJ SOAJ injection Use as directed for severe allergic reaction 01/03/24  Yes Padgett, Danita Macintosh, MD  fluticasone  (FLONASE ) 50 MCG/ACT nasal spray USE 1 SPRAY(S) IN EACH NOSTRIL IN THE MORNING AND AT BEDTIME Patient taking differently: Place 1 spray into both nostrils daily. 01/14/24  Yes Jeneal Danita Macintosh, MD  ipratropium (ATROVENT ) 0.06 % nasal spray Place 2 sprays into both nostrils 3 (three) times daily as needed for rhinitis. 01/03/24  Yes Jeneal Danita Macintosh, MD  levothyroxine  (SYNTHROID ) 50 MCG tablet TAKE 1 TABLET BY MOUTH ONCE DAILY BEFORE BREAKFAST 03/10/24  Yes Norleen Lynwood ORN, MD  memantine  (NAMENDA ) 10 MG tablet Take 1 tablet (10 mg total) by mouth 2 (two) times daily. 10/30/23  Yes McCue, Harlene, NP  montelukast  (SINGULAIR ) 10 MG tablet Take 1 tablet (10 mg total) by mouth at bedtime. 01/03/24  Yes Padgett, Danita Macintosh, MD  ondansetron  (ZOFRAN -ODT) 4 MG disintegrating tablet Take 1 tablet (4 mg total) by mouth every 8 (eight) hours as needed for nausea or vomiting. 07/09/22  Yes Merlynn Eland F, FNP  pantoprazole  (PROTONIX ) 40 MG tablet Take 1 tablet by mouth once daily 12/30/23  Yes Norleen Lynwood ORN, MD  pravastatin  (PRAVACHOL ) 80 MG tablet Take 1 tablet by mouth once daily 04/29/23  Yes Norleen Lynwood ORN, MD  SYMBICORT  80-4.5 MCG/ACT inhaler INHALE 2 PUFFS BY MOUTH IN THE MORNING 01/03/24  Yes Padgett, Danita Macintosh, MD  gentamicin  ointment (GARAMYCIN ) 0.1 % Apply 1 Application topically daily. Apply to wound daily between toes.  Use a very small amount. Patient not taking: Reported on 03/10/2024 08/27/23   McCaughan, Dia D, DPM  Magnesium  Hydroxide (PHILLIPS MILK OF MAGNESIA PO) Take 15-30 mLs by mouth daily as needed (for constipation).    [provider]  potassium chloride  SA (KLOR-CON  M) 20 MEQ tablet Take 2 tablets (40 mEq total) by mouth daily as needed. Take potasium days you take lasix . Patient not taking: Reported on 03/10/2024 01/03/23   Norleen Lynwood ORN, MD     Thank you for this interesting consult. I have spent 45 minutes evaluating patient, reviewing chart, and discussing plan of care with patient, family, and primary medical team. If you have any questions or concerns please reach out to me via pager ([phone]).  Paula Southerly, MD [facility]

## 2024-04-03 NOTE — Plan of Care (Signed)

## 2024-04-03 NOTE — Anesthesia Procedure Notes (Signed)
 Procedure Name: Intubation Date/Time: 04/03/2024 2:46 PM  Performed by: Dartha Meckel, CRNAPre-anesthesia Checklist: Patient identified, Emergency Drugs available, Suction available and Patient being monitored Patient Re-evaluated:Patient Re-evaluated prior to induction Oxygen Delivery Method: Circle system utilized Preoxygenation: Pre-oxygenation with 100% oxygen Induction Type: IV induction Ventilation: Mask ventilation without difficulty Laryngoscope Size: Mac and 3 Grade View: Grade I Tube type: Oral Number of attempts: 1 Airway Equipment and Method: Stylet and Oral airway Placement Confirmation: ETT inserted through vocal cords under direct vision, positive ETCO2 and breath sounds checked- equal and bilateral Secured at: 21 cm Tube secured with: Tape Dental Injury: Teeth and Oropharynx as per pre-operative assessment

## 2024-04-03 NOTE — Op Note (Signed)
 04/03/2024  4:25 PM  PATIENT:  Angelica [name] Lot  83 y.o. female  Patient Care Team: Norleen Lynwood ORN, MD as PCP - General (Internal Medicine) Evertt Lonell BROCKS, RN as Oncology Nurse Navigator (Oncology) Sherrod Sherrod, MD as Consulting Physician (Oncology) Jeneal Danita Macintosh, MD as Consulting Physician (Allergy) Brenna Adine CROME, DO as Consulting Physician (Pulmonary Disease) Angelica Eather GORMAN, MD as Consulting Physician (Neurology) Ermalinda Penton [place], [place] as [facility] Management Cleatus Collar, MD as Consulting Physician (Ophthalmology)  PRE-OPERATIVE DIAGNOSIS:  Small bowel obstruction  POST-OPERATIVE DIAGNOSIS:  Same  PROCEDURE:   Exploratory laparotomy with lysis of adhesions x 100 minutes Small bowel resection Small bowel repair  SURGEON:  Lonni HERO. Teresa, MD  ASSISTANT: Margretta Brash, MD  ANESTHESIA:   general  COUNTS:  Sponge, needle and instrument counts were reported correct x2 at the conclusion of the operation.  EBL: 50 mL  DRAINS: None  SPECIMEN: Segment of small bowel  FINDINGS: Very distended chronically dilated appearing loop of small bowel which was also quite fragile and with gentle manipulation and attempts to eviscerate from the abdomen, did spontaneously perforate..  Apparent small bowel obstruction related to an adhesion from the omentum.  Did not appear to be a closed-loop configuration per se.  Small bowel resection carried out.  Additionally, during the extensive adhesiolysis, we encountered one 1 cm serosal tear that was repaired, inherent to the nature of the procedure  DISPOSITION: PACU in satisfactory condition  DESCRIPTION:  The patient was seen in the pre-op holding area. The risks, benefits, complications, treatment options, and expected outcomes were previously discussed with the patient. The patient agreed with the proposed plan and has signed the informed consent form. The patient was brought to the operating room by the surgical  team, identified as Angelica [name] Lot, and the procedure verified. She placed supine on the operating table and SCD's were applied. General anesthesia was induced without difficulty. She was positioned supine.  Pressure points were evaluated and padded.  A foley catheter was then placed by nursing under sterile conditions. Hair on the abdomen was clipped.  She was secured to the operating table. The abdomen was then prepped and draped in the standard sterile fashion. A time out was completed and the above information confirmed and need for preoperative antibiotics.  A midline incision is created.  The subcutaneous tissue divided electrocautery.  The fascia is incised sharply.  The peritoneal cavity is entered.  There are some omental containing lesions across the central Orson of the abdomen which were carefully taken down using the LigaSure device.  There were no other significant bowel-containing lesions to the abdominal wall.  There is a massively dilated loop of small bowel immediately visible.  We widened our incision.  We then attempted to eviscerate the small bowel from the abdomen but with gentle manipulation, this large segment of bowel did perforate.  We were able to control this and evacuated approximately 2 L of succus from the small bowel.   After doing this, the anatomy of her bowel is much more apparent.  It appears that she did have a small bowel obstruction with a transition point related to loop of omentum tethered to small bowel mesentery. We were able to lyse this and free everything up.  This segment of small bowel also has multiple serosal defects around the area where it is perforated.  We therefore opted to plan for a small bowel resection.  Spillage was controlled with clamps.  We then spent  time with adhesiolysis, totaling approximately 100 minutes.  This was necessary to confirm bowel anatomy and that there is no twisting or any evident downstream obstructions of which none are  found.  There are multiple adhesions that are interloop in nature but do not at all appear obstructing somewhat cerebriform in nature.  These were intentionally left alone as the risk for bowel injury on these was not insignificant and these again do not appear to cause any evident obstruction.  Small bowel contents are able to be easily milked all the way down to the level of the cecum.  Attention was turned to resecting the segment of bowel with perforation.  When retrograde the mesentery on 2 subsequent healthy edges.  The bowel divided using a GIA blue load stapler.  The intervening mesentery was ligated and divided using the hand-held LigaSure device.  Cut surfaces were inspected and hemostatic.  The specimen is passed off.  Attention is directed to creating a small bowel anastomosis.  The antimesenteric corner of each suspected staple line is removed.  A 75 mm GIA blue stapler was used to create a enteroenterostomy.  Staples are well-formed, hemostatic.  The common enterotomy is then elevated with Allis clamps and closed using a TA 90 blue load stapler.  Staple line is well-formed and hemostatic.  The corners of the TA staple line were dunked with 2-0 silk suture.  Contents were then milked to and fro and there is no extravasation of succus.  A 3-0 silk stitch was placed at the apex anastomosis.  The mesenteric defect is then closed using interrupted 3-0 Vicryl sutures.  This is placed back in the abdomen in its anatomic configuration.  The small bowel was then run from the ligament of Treitz all the way down to the cecum.  There were no evident structures, serosal injuries, or other abnormal findings.  The abdomen is then irrigated with copious amounts of warm saline.  The colon is grossly normal in appearance.  NG tube is in good position.  Attention is then turned to closure.  The midline fascia is closed using 2 running #1 looped PDS sutures.  All sponge, needle, and Insa counts were reported correct.   The wound is washed and dried.  The skin is intentionally left open although there are a few areas of bridging staples to decrease the wound size.  The skin and subcutaneous tissue was then loosely packed with a moist Kerlix gauze.  She is subsequently woken from anesthesia, extubated, and transferred to a stretcher for transport to recovery in satisfactory condition.  Plans in place for admission at least overnight to the intensive care unit for further monitoring.  Family has been updated at completion including on the findings and plans moving forward.  All their questions were answered.  They expressed understanding, and agreement the plan.

## 2024-04-03 NOTE — Anesthesia Preprocedure Evaluation (Addendum)
 Anesthesia Evaluation  Patient identified by MRN, date of birth, ID band Patient confused    Reviewed: Allergy & Precautions, NPO status , Patient's Chart, lab work & pertinent test results  History of Anesthesia Complications Negative for: history of anesthetic complications  Airway Mallampati: Unable to assess  TM Distance: >3 FB Neck ROM: Full    Dental  (+) Poor Dentition   Pulmonary asthma , neg sleep apnea, neg COPD, Patient abstained from smoking.Not current smoker, former smoker   Pulmonary exam normal breath sounds clear to auscultation       Cardiovascular Exercise Tolerance: Good METShypertension, Pt. on medications (-) CAD and (-) Past MI (-) dysrhythmias  Rhythm:Regular Rate:Normal - Systolic murmurs    Neuro/Psych  PSYCHIATRIC DISORDERS     Dementia negative neurological ROS     GI/Hepatic ,GERD  ,,(+)     (-) substance abuse  Small bowel obstruction   Endo/Other  neg diabetesHypothyroidism    Renal/GU negative Renal ROS     Musculoskeletal   Abdominal  (+)  Abdomen: tender.   Peds  Hematology  (+) Blood dyscrasia, anemia   Anesthesia Other Findings Past Medical History: 05/21/1978: Allergy No date: Arthritis No date: Asthma No date: Bowel obstruction (HCC) No date: Cancer (HCC) No date: Cataract No date: Environmental allergies No date: GERD (gastroesophageal reflux disease) 05/09/2017: Glaucoma No date: H. pylori infection 07/05/20-08/05/20: History of radiation therapy     Comment:  Left lung IMRT Dr. Shannon  01/16/2019: HLD (hyperlipidemia) No date: Hypertension No date: Iron  deficiency anemia 05/26/2017: Osteopenia No date: Thyroid  disease 07/25/2018: Urticaria  Reproductive/Obstetrics                              Anesthesia Physical Anesthesia Plan  ASA: 4 and emergent  Anesthesia Plan: General   Post-op Pain Management: Dilaudid  IV    Induction: Intravenous and Rapid sequence  PONV Risk Score and Plan: 4 or greater and Ondansetron , Dexamethasone  and Treatment may vary due to age or medical condition  Airway Management Planned: Oral ETT  Additional Equipment: None  Intra-op Plan:   Post-operative Plan: Extubation in OR and Possible Post-op intubation/ventilation  Informed Consent: I have reviewed the patients History and Physical, chart, labs and discussed the procedure including the risks, benefits and alternatives for the proposed anesthesia with the patient or authorized representative who has indicated his/her understanding and acceptance.     Dental advisory given  Plan Discussed with: CRNA and Surgeon  Anesthesia Plan Comments: (Discussed risks of anesthesia with patient's daughter/HCP Taron Packard(due to patient's mental status/cognition), including PONV, sore throat, lip/dental/eye damage. Rare risks discussed as well, such as cardiorespiratory and neurological sequelae, and allergic reactions. Discussed the role of CRNA in patient's perioperative care.  counseled on being higher risk for anesthesia due to comorbidities: acute bowel obstruction. Discussed potential prolonged intubation. She was told about increased risk of cardiac and respiratory events, including death. Discussed code status. She believes patient would want to be full code in the event of perioperative cardiac arrest. )         Anesthesia Quick Evaluation

## 2024-04-03 NOTE — Evaluation (Signed)
 Occupational Therapy Evaluation Patient Details Name: Angelica [name] MRN: [id number] DOB: 12/05/1940 Today's Date: 04/03/2024   History of Present Illness   Angelica [name] is a 83 y.o. female presented to ED with complaints of weakness; admitted 11/9 with large loculated left pleural effusion.03/31/2024 diagnosed with SBO, placed NGT to LIS and 04/01/24 s/p pigtail chest tube placed. 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 Pt lethargic/obtunded and unable to actively participate or provide insight to PLOF. Info taken from chart review. Prior to admission, pt independent for ADLs and light household chores, lives with daughter and does not use AD for mobility. Pt awakens briefly to state name and [birth year] as birthday, states no when asked about pain, but drifts back to sleep and keeps eyes closed for the remainder of the session. Pt is able to roll with multimodal cuing and MIN A to R side on initial attempt, requires MAX A +2 for further trials and TOTAL A for bed level UB/LB bathing + dressing. Chest tube and NGT intact start / end of session. MD in room during session to discuss potential surgery, OT will continue to follow and update discharge recommendation as appropriate. Pt would benefit from skilled OT services to address noted impairments and functional limitations (see below for any additional details) in order to maximize safety and independence while minimizing falls risk and caregiver burden. Anticipate the need for follow up OT services upon acute hospital DC. Patient will benefit from continued inpatient follow up therapy, <3 hours/day.      If plan is discharge home, recommend the following:   Two people to help with walking and/or transfers;Two people to help with bathing/dressing/bathroom;Assistance with  cooking/housework;Direct supervision/assist for medications management;Direct supervision/assist for financial management;Assist for transportation;Help with stairs or ramp for entrance;Supervision due to cognitive status     Functional Status Assessment   Patient has had a recent decline in their functional status and demonstrates the ability to make significant improvements in function in a reasonable and predictable amount of time.     Equipment Recommendations   None recommended by OT      Precautions/Restrictions   Precautions Precautions: Fall Recall of Precautions/Restrictions: Impaired Precaution/Restrictions Comments: chest tube, NG Restrictions Weight Bearing Restrictions Per Provider Order: No     Mobility Bed Mobility Overal bed mobility: Needs Assistance Bed Mobility: Rolling Rolling: Min assist, Max assist, +2 for physical assistance, +2 for safety/equipment, Used rails         General bed mobility comments: pt able to reach for bed rail with multimodal cuing, rolls with MIN A start of session when alert, but unable to actively participate for the remainder of the session    Transfers Overall transfer level: Needs assistance                 General transfer comment: unsafe to attempt      Balance Overall balance assessment: Needs assistance     Sitting balance - Comments: NT                                   ADL either performed or assessed with clinical judgement   ADL Overall ADL's : Needs assistance/impaired Eating/Feeding: NPO Eating/Feeding Details (indicate cue type and reason): NG Grooming: Bed level;Total assistance  Upper Body Bathing: Bed level;Total assistance   Lower Body Bathing: Bed level;+2 for physical assistance;Total assistance   Upper Body Dressing : Bed level;Total assistance Upper Body Dressing Details (indicate cue type and reason): doff/donn gown Lower Body Dressing: Bed level;Total  assistance;+2 for physical assistance Lower Body Dressing Details (indicate cue type and reason): doff / don underwear. pt does not follow commands to participate, NT in room to assist     Toileting- Clothing Manipulation and Hygiene: Bed level;Total assistance;+2 for physical assistance         General ADL Comments: NT in room to assist with bed level bathing, pericare and dressing. Pt rolls with minA to the R, reaching for bed rail, but stops participating. TOTAL A for bed level tasks.      Pertinent Vitals/Pain Pain Assessment Pain Assessment: PAINAD Breathing: normal Negative Vocalization: none Facial Expression: smiling or inexpressive Body Language: relaxed Consolability: no need to console PAINAD Score: 0     Extremity/Trunk Assessment Upper Extremity Assessment Upper Extremity Assessment: Generalized weakness   Lower Extremity Assessment Lower Extremity Assessment: Generalized weakness   Cervical / Trunk Assessment Cervical / Trunk Assessment: Kyphotic   Communication Communication Communication: Impaired Factors Affecting Communication: Other (comment) (not conversational, cannot make wants or needs known)   Cognition Arousal: Lethargic, Obtunded Behavior During Therapy: Flat affect Cognition: No family/caregiver present to determine baseline, Cognition impaired   Orientation impairments: Situation, Time Awareness: Intellectual awareness impaired Memory impairment (select all impairments): Short-term memory     OT - Cognition Comments: pt lethargic, opens eyes briefly and states name, birthday August but keeps eyes closed rest of session                 Following commands: Impaired Following commands impaired: Follows one step commands inconsistently     Cueing  General Comments   Cueing Techniques: Verbal cues;Gestural cues;Tactile cues;Visual cues  NGT and chest tube intact start / end of session. MD in room to discuss sx, NT assisting  with repositioning in bed with [facility] >30           Home Living Family/patient expects to be discharged to:: Private residence Living Arrangements: Children Available Help at Discharge: Family Type of Home: House Home Access: Level entry     Home Layout: One level     Bathroom Shower/Tub: Chief Strategy Officer: Standard     Home Equipment: Agricultural Consultant (2 wheels);Cane - single point          Prior Functioning/Environment Prior Level of Function : Independent/Modified Independent             Mobility Comments: pt reports ind without AD for in home ambulation, limited community ambulator ADLs Comments: pt reports ind with self care, completes sponge baths, completes light household chores, daughter completes heavy household chores    OT Problem List: Decreased strength;Decreased range of motion;Decreased activity tolerance;Impaired balance (sitting and/or standing);Decreased cognition;Pain;Decreased knowledge of precautions;Decreased knowledge of use of DME or AE   OT Treatment/Interventions: Self-care/ADL training;Therapeutic exercise;Energy conservation;DME and/or AE instruction;Therapeutic activities;Cognitive remediation/compensation;Patient/family education;Balance training      OT Goals(Current goals can be found in the care plan section)   Acute Rehab OT Goals OT Goal Formulation: Patient unable to participate in goal setting Time For Goal Achievement: 04/17/24 Potential to Achieve Goals: Fair   OT Frequency:  Min 2X/week       AM-PAC OT 6 Clicks Daily Activity     Outcome Measure Help from another person eating meals?:  Total Help from another person taking care of personal grooming?: Total Help from another person toileting, which includes using toliet, bedpan, or urinal?: Total Help from another person bathing (including washing, rinsing, drying)?: Total Help from another person to put on and taking off regular upper body clothing?:  Total Help from another person to put on and taking off regular lower body clothing?: Total 6 Click Score: 6   End of Session Nurse Communication: Mobility status  Activity Tolerance: Patient limited by lethargy Patient left: in bed;with call bell/phone within reach;with bed alarm set;with nursing/sitter in room  OT Visit Diagnosis: Pain;Muscle weakness (generalized) (M62.81);Other symptoms and signs involving cognitive function;Other abnormalities of gait and mobility (R26.89);Unsteadiness on feet (R26.81)                Time: 8949-8888 OT Time Calculation (min): 21 min Charges:  OT General Charges $OT Visit: 1 Visit OT Evaluation $OT Eval Moderate Complexity: 1 Mod  Katrine Radich L. Chloe Baig, OTR/L  04/03/24, 1:48 PM

## 2024-04-03 NOTE — Anesthesia Postprocedure Evaluation (Signed)
 Anesthesia Post Note  Patient: Angelica [name]  Procedure(s) Performed: EXPLORATORY LAPAROTOMY, LYSIS OF ADHESIONS, SMALL BOWEL RESECTION, SMALL BOWEL REPAIR     Patient location during evaluation: PACU Anesthesia Type: General Level of consciousness: sedated and patient cooperative Pain management: pain level controlled Vital Signs Assessment: post-procedure vital signs reviewed and stable Respiratory status: spontaneous breathing, nonlabored ventilation, respiratory function stable and patient connected to nasal cannula oxygen Cardiovascular status: blood pressure returned to baseline and stable Postop Assessment: no apparent nausea or vomiting Anesthetic complications: no   No notable events documented.  Last Vitals:  Vitals:   04/03/24 1715 04/03/24 1730  BP: 102/68 100/68  Pulse: 100 96  Resp: (!) 21 17  Temp: (!) 36.2 C   SpO2: 96% 99%    Last Pain:  Vitals:   04/03/24 1700  TempSrc:   PainSc: Asleep                 Pinki Rottman,E. Cadden Elizondo

## 2024-04-03 NOTE — Progress Notes (Signed)
 Progress Note  2 Days Post-Op  Subjective: Patient's is present via phone (called after seeing her myself).  Less responsive this morning.  No BM or flatulence. Currently NPO.  ROS  All negative with the exception of above.  Objective: Vital signs in last 24 hours: Temp:  [98.1 F (36.7 C)-99.3 F (37.4 C)] 98.1 F (36.7 C) (11/14 0558) Pulse Rate:  [95-105] 95 (11/14 0558) Resp:  [15-20] 20 (11/14 0558) BP: (142-159)/(85-97) 155/92 (11/14 0558) SpO2:  [98 %-99 %] 98 % (11/14 0558) Last BM Date : 04/02/24  Intake/Output from previous day: 11/13 0701 - 11/14 0700 In: 351.6 [IV Piggyback:241.6] Out: 1210 [Emesis/NG output:115; Chest Tube:1095] Intake/Output this shift: No intake/output data recorded.  PE: General: Pleasant female who is sitting in chair at bedside in NAD. HEENT: Head is normocephalic, atraumatic. NGT in place. Heart: Normal HR during encounter.  Lungs: Respiratory effort nonlabored on room air. Breath sounds on left side diminished.  Abd: Soft with significant distention. No significant tenderness. No rebound tenderness or guarding. Scars noted from previous surgeries. Skin: Warm and dry    Lab Results:  Recent Labs    04/02/24 0412 04/03/24 0445  WBC 16.2* 19.2*  HGB 13.5 15.0  HCT 41.3 45.0  PLT 461* 497*   BMET Recent Labs    04/02/24 0412 04/03/24 0445  NA 139 143  K 4.1 4.1  CL 105 104  CO2 17* 19*  GLUCOSE 124* 142*  BUN 24* 28*  CREATININE 0.75 0.81  CALCIUM  10.0 11.0*   PT/INR No results for input(s): LABPROT, INR in the last 72 hours.  CMP     Component Value Date/Time   NA 143 04/03/2024 0445   K 4.1 04/03/2024 0445   CL 104 04/03/2024 0445   CO2 19 (L) 04/03/2024 0445   GLUCOSE 142 (H) 04/03/2024 0445   BUN 28 (H) 04/03/2024 0445   CREATININE 0.81 04/03/2024 0445   CREATININE 0.96 12/27/2023 0757   CREATININE 0.86 11/02/2022 1604   CALCIUM  11.0 (H) 04/03/2024 0445   PROT 7.4 03/29/2024 0926   PROT 7.7  06/07/2017 0940   ALBUMIN  3.2 (L) 03/29/2024 0926   AST 19 03/29/2024 0926   AST 24 12/27/2023 0757   ALT 10 03/29/2024 0926   ALT 15 12/27/2023 0757   ALKPHOS 77 03/29/2024 0926   BILITOT 0.7 03/29/2024 0926   BILITOT 0.7 12/27/2023 0757   GFRNONAA >60 04/03/2024 0445   GFRNONAA 58 (L) 12/27/2023 0757   GFRAA >60 08/15/2017 0012   Lipase     Component Value Date/Time   LIPASE 30 10/09/2023 1042       Studies/Results: DG Abd 1 View Result Date: 04/03/2024 EXAM: 1 VIEW XRAY OF THE ABDOMEN 04/03/2024 07:50:00 AM COMPARISON: 04/02/2024 CLINICAL HISTORY: SBO (small bowel obstruction) (HCC) FINDINGS: LINES, TUBES AND DEVICES: Enteric tube is similarly positioned with the tip in the gastric antrum. Partially visualized pigtail drainage catheter in lower left chest. BOWEL: Severe dilation of multiple segments of small bowel in the upper abdomen measuring up to 7.9 cm in diameter. There is enteric contrast present, but most of this contrast appears to be within the small bowel and has not reached the colon since yesterday. SOFT TISSUES: No opaque urinary calculi. BONES: No acute osseous abnormality. IMPRESSION: 1. Persistent severe dilation of multiple segments of small bowel in the mid to upper abdomen measuring up to 7.9 cm in diameter, with enteric contrast present, but not definitively reaching the colon. These findings remain especially worrisome  for a closed-loop small bowel obstruction. 2. These results will be called to the ordering clinician or representative by the Radiologist Assistant and communication documented in the PACS or [employer]. Electronically signed by: Rogelia Myers MD 04/03/2024 08:27 AM EST RP Workstation: [id number]   DG CHEST PORT 1 VIEW Result Date: 04/03/2024 EXAM: 1 VIEW(S) XRAY OF THE CHEST 04/03/2024 05:33:00 AM COMPARISON: Portable chest dated 04/02/2024 07:52 AM. CLINICAL HISTORY: [id number] Pneumothorax [id number] Pneumothorax FINDINGS: LINES, TUBES AND  DEVICES: Pigtail tube thoracostomy in the lower lateral left chest stable in position, but appears kinked in 2 places outside the chest. NGT is well inside the stomach, but the side hole and tip are out of view. LUNGS AND PLEURA: There is continued improvement and left basilar consolidation/atelectasis with haziness and mild interstitial coarsening remaining. The remaining lungs although hypoinflated appear clear. There is no measurable pneumothorax. There is no substantial pleural effusion. HEART AND MEDIASTINUM: The cardiomediastinal silhouette is normal. There is aortic atherosclerosis. BONES AND SOFT TISSUES: Thoracic spondylosis. Old right hemithyroidectomy. No acute osseous abnormality. IMPRESSION: 1. No measurable pneumothorax. 2. Pigtail tube thoracostomy in the lower lateral left chest is stable in position but appears kinked in two places outside the chest. 3. Continued improvement in left basilar consolidation/atelectasis with residual haziness and mild interstitial coarsening. Electronically signed by: Francis Quam MD 04/03/2024 05:55 AM EST RP Workstation: [id number]   DG Abd Portable 1V Result Date: 04/02/2024 CLINICAL DATA:  Small bowel obstruction. EXAM: PORTABLE ABDOMEN - 1 VIEW COMPARISON:  1 day prior FINDINGS: Markedly dilated opacified small bowel loops in the central abdomen and upper pelvis are similar to prior. The does appear to be some gas in the transverse colon and splenic flexure although no colonic contrast evident. NG tube remains in place with catheter either folded back on itself in the gastric antrum or potentially with the tip of the NG tube in a transpyloric position in the proximal duodenum. Left base collapse/consolidation with effusion evident with incomplete visualization of the left chest tube. IMPRESSION: 1. Markedly dilated opacified small bowel loops in the central abdomen and upper pelvis are similar to prior. There does appear to be some gas in the transverse colon  and splenic flexure although no colonic contrast evident. Imaging features remain compatible with small bowel obstruction. 2. NG tube remains in place with catheter either folded back on itself in the gastric antrum or potentially with the tip of the NG tube in a transpyloric position in the proximal duodenum. Electronically Signed   By: Camellia Candle M.D.   On: 04/02/2024 08:32   DG CHEST PORT 1 VIEW Result Date: 04/02/2024 CLINICAL DATA:  Chest tube. EXAM: PORTABLE CHEST 1 VIEW COMPARISON:  04/01/2024 FINDINGS: 0752 hours. Interval improvement in aeration of the left lung with decreased opacity over the left mid and lower hemithorax. Left chest tube remains in place without discernible left-sided pneumothorax. Left base collapse/consolidation with small effusion evident. Right lung clear. Cardiopericardial silhouette is at upper limits of normal for size. The NG tube passes into the stomach although the distal tip position is not included on the film. Telemetry leads overlie the chest. IMPRESSION: 1. Interval improvement in aeration of the left lung with decreased opacity over the left mid and lower hemithorax. 2. Left base collapse/consolidation with small effusion. Electronically Signed   By: Camellia Candle M.D.   On: 04/02/2024 08:21   DG Abd Portable 1V-Small Bowel Obstruction Protocol-initial, 8 hr delay Result Date: 04/01/2024 CLINICAL DATA:  Bowel obstruction EXAM:  PORTABLE ABDOMEN - 1 VIEW COMPARISON:  03/31/2024, CT 03/31/2024, CT 03/29/2024 FINDINGS: Enteric tube is folded back upon itself distally in the region of the distal stomach with the tube tip directed to the left, cannot exclude kink in the catheter. Contrast within the urinary bladder. Dilute contrast within markedly dilated small bowel, the small bowel is dilated up to 7.8 cm. No contrast in the colon IMPRESSION: 1. Enteric tube is folded back upon itself distally in the region of the distal stomach with the tube tip directed to the  left, cannot exclude kink in the catheter. 2. Dilute contrast within markedly dilated small bowel consistent with small bowel obstruction, closed loop physiology not excluded. No contrast in the colon. Electronically Signed   By: Luke Bun M.D.   On: 04/01/2024 20:20   DG CHEST PORT 1 VIEW Result Date: 04/01/2024 CLINICAL DATA:  Post chest tube placement. EXAM: PORTABLE CHEST 1 VIEW COMPARISON:  03/29/2024 FINDINGS: Interval placement of left-sided chest tube over the mid to lower thorax. Enteric tube courses to the region of the stomach and has tip over the midline which 2 within the distal stomach versus duodenum. No significant change in opacification over approximately 2/3 of the left hemithorax compatible with known large effusion and associated basilar compressive atelectasis. No pneumothorax. Right lung is clear. Under the exam is unchanged. IMPRESSION: 1. Interval placement of left-sided chest tube over the mid to lower thorax. No pneumothorax. 2. No significant change in opacification over approximately 2/3 of the left hemithorax compatible with known large effusion and associated basilar compressive atelectasis. Electronically Signed   By: Toribio Agreste M.D.   On: 04/01/2024 15:53    Anti-infectives: Anti-infectives (From admission, onward)    Start     Dose/Rate Route Frequency Ordered Stop   03/30/24 2200  vancomycin  (VANCOREADY) IVPB 750 mg/150 mL        750 mg 150 mL/hr over 60 Minutes Intravenous Every 24 hours 03/29/24 1620     03/30/24 0400  ceFEPIme  (MAXIPIME ) 2 g in sodium chloride  0.9 % 100 mL IVPB        2 g 200 mL/hr over 30 Minutes Intravenous Every 12 hours 03/29/24 1620     03/29/24 1530  vancomycin  (VANCOCIN ) IVPB 1000 mg/200 mL premix        1,000 mg 200 mL/hr over 60 Minutes Intravenous  Once 03/29/24 1517 03/30/24 0228   03/29/24 1530  ceFEPIme  (MAXIPIME ) 2 g in sodium chloride  0.9 % 100 mL IVPB        2 g 200 mL/hr over 30 Minutes Intravenous  Once 03/29/24  1518 03/29/24 1724        Assessment/Plan Nausea, vomiting, SBO  - Original CT from 11/9 showed no intra-abdominal pathology.  - CT from 11/11 shows small bowel obstruction with proximal transition point just beyond the ligament of Treitz, likely due to adhesions from prior surgery. Stable large loculated left pleural effusion with unchanged left lower lobe consolidation. Small right pleural effusion. -Afebrile.  -WBC decreased to 16 (down-trending); afebrile -Abdomen soft but with distention. Having generalized pain that she feels has improved since NGT placement. No n/v. -NGT output cannister has 400 mL of output. -Maintain NGT and NPO.  -Hopefully, there is resolution with non-operative management. I did have a brief discussion with daughter who is power of attorney about risk and benefits of surgery should this not self resolve.  - Goals of care discussions with palliative have occurred - full scope of care at present  -  Xray shows worsening of distention at this point of small bowel.  --PCCM following s/p chest tube placement  FEN - NPO/NGT/IVFs per [facility] VTE - Plavix  on hold, lovenox  ID - Vanc, maxipime    HTN HLD h/o CVA Alzheimer's disease (although oriented currently) Stage III adenocarcinoma of lung GERD Hypothyroidism  Long discussion with daughter over the phone.  We spent time discussing her diagnosis and workup today. We discussed if surgery were pursued this would involve an exploratory laparotomy, lysis of adhesions, possible bowel resection.  -The planned procedure, material risks (including, but not limited to, pain, bleeding, infection, scarring, need for blood transfusion, damage to surrounding structures- blood vessels/nerves/viscus/organs, damage to ureter, urine leak, leak from anastomosis, need for additional procedures, blood clot, pulmonary embolus, worsening of pre-existing medical conditions, chronic diarrhea, constipation secondary to narcotic use, hernia,  recurrence, pneumonia, heart attack, stroke, death) benefits and alternatives to surgery were discussed at length. We discussed that with her overall state of health, this not being a trivial procedure for her to undergo.  We did discuss an increased risk of perioperative complications.  We discussed the possibility of prolonged intubation.  We discussed the possibility of heart attack, stroke, failure to thrive/recover.  Even if she were to recover, we discussed prolonged recovery including potential for skilled nursing facility level of care.  Her daughter Larry has been making medical decisions for her and reports she is a clinical research associate for the family.  She would like to discuss this further with her siblings who are all in the way to bedside.  We will plan to return for further discussions soon after further times and allowed for decision making.     LOS: 5 days   I reviewed nursing notes, specialist notes, hospitalist notes, last 24 h vitals and pain scores, last 48 h intake and output, last 24 h labs and trends, and last 24 h imaging results.  This care required high  level of medical decision making.   I spent a total of 55 minutes today in both face-to-face and non-face-to-face activities to perform the following: review records, take and update history, examine the patient, counsel the patient on the diagnosis, and document encounter, findings, and plan in the EHR  for this visit on the date of this encounter.    Lonni CHRISTELLA Pizza, MD  [facility] 04/03/2024, 11:17 AM Please see Amion for pager number during day hours 7:00am-4:30pm

## 2024-04-03 NOTE — Plan of Care (Signed)

## 2024-04-03 NOTE — Transfer of Care (Signed)
 Immediate Anesthesia Transfer of Care Note  Patient: Angelica [name]  Procedure(s) Performed: EXPLORATORY LAPAROTOMY, LYSIS OF ADHESIONS, SMALL BOWEL RESECTION, SMALL BOWEL REPAIR  Patient Location: PACU  Anesthesia Type:General  Level of Consciousness: confused  Airway & Oxygen Therapy: Patient Spontanous Breathing and Patient connected to face mask oxygen  Post-op Assessment: Report given to RN and Post -op Vital signs reviewed and stable  Post vital signs: Reviewed and stable  Last Vitals:  Vitals Value Taken Time  BP 120/76 04/03/24 16:47  Temp 97.3   Pulse 89 04/03/24 16:54  Resp 21 04/03/24 16:55  SpO2 90 % 04/03/24 16:54  Vitals shown include unfiled device data.  Last Pain:  Vitals:   04/03/24 0947  TempSrc:   PainSc: Asleep         Complications: No notable events documented.

## 2024-04-03 NOTE — Progress Notes (Signed)
 PROGRESS NOTE [name]  FMW:[id number] DOB: 1940/07/12 DOA: 03/29/2024 PCP: Norleen Lynwood ORN, MD  Brief Narrative/Hospital Course: Angelica [name] is a 83 y.o. female with PMH of GERD, HTN, HLD, IDA, asthma, stage 3b adenocarcinoma of the lung, hypothyroidism, hx of CVA, Alzheimer's, hx of SBO due to adhesions in 2016 and 2021 who presented to ED with complaints of weakness and  5 days of c/o w/ breast being heavy or swollen.  Patient had difficulty working at [employer] and also had chest.  At baseline she can bathe and dress on her own. She feeds herself. Can do all ADLs, but cook.  In  E:  vitals: afebrile, bp: 125/75, HR: 91, RR: 18, oxygen: 98%RA wbc: 21.8, platelets 418, lactic acid: 2.1, troponin 27 CXR: Near-complete opacification of the left lung, differential includes large malignant pleural effusion, central hilar mass with obstruction, lobar atelectasis, and/or pneumonia.  CT head: no acute finding CT abdomen/pelvis: Large highly located left pleural effusion with compressive atelectasis throughout the left lung. Central low-attenuation in the left lower lobe may be due to radiation scarring.Small pericardial effusion and trace right pleural effusion. Moderate stool burden.  Rectal prolapse Patient was placed on IV antibiotics, pulmonary consulted and admitted for further management 11/11 found to have SBO on x-ray with nausea and vomiting x 1  Subjective: Patient seen this morning, she was very lethargic. Planned for exploratory laparotomy, possible lysis of adhesions and possible bowel resection per surgery  Assessment and plan:  Large highly loculated left pleural effusion History of ACC of the lung: Presenting mostly with weakness, cough otherwise no significant respiratory symptoms, PCCM following-infection versus malignancy, Blood culture no growth so far.  On room air. Cont broad-spectrum antibiotics w/ vancomycin  and cefepime .  She had a chest tube placed on  04/01/2024, received a dose of tPA/dornase 11/12 with 1500 mL of output.  tPA/dornase to be repeated today.  Pulmonology is managing chest tube. Fluid studies suggest possible infectious etiology due to elevated WBC count, elevated LDH, elevated protein fluid.  Small bowel obstruction Previous SBO X 2:  Patient complained of stomach upset and vomited 11/11 x-ray concerning for SBO surgery consulted NG tube inserted. Repeat CT abdomen 11/11 small bowel obstruction with proximal transition point just beyond the ligament of Treitz Continue IV fluid hydration antiemetics, continue with NG tube.  Continue with conservative management.  Abdominal x-rays continue to show evidence of small bowel obstruction, hemoglobin is some gas visible in the colon now. General surgery is following.  Hypokalemia: Replacing iv  Generalized weakness/debility: Cont PT OT eval,keep on fall precaution  Adenocarcinoma of left lung, stage 3: diagnosed in 05/2020-S/p concurrent chemoradiation with weekly carboplatin  for AUC of 2 and paclitaxel  45 mg/M2. S/P 5 X -2022. S/p Consolidation treatment with immunotherapy with Imfinzi  1500 mg IV q 4 wk x 3 cycles- discontinued 2/2 intolerance/sepsis. Currently on observation, follow-up cytology with chest tube   Elevated troponin: Flat and mildly elevated troponin 27>28, suspect demand ischemia due to #1   HTN  Controlled on amlodipine    History of stroke?  HLD Continue statin, allergy to ASA, on Plavix  PTA. Per her neuro note October 30 2023- She had an MRI scan of the brain done on 07/18/2021 which I personally reviewed and shows only mild changes of small vessel disease and generalized atrophy. I do not see a definite right cerebellar stroke but the radiologist has called 1 which apparently was not seen on previous MRI from 06/14/2020. Patient has since been started  on Plavix  by Dr. Norleen  Neuro Dr Rosemarie did not feel it was CVA. Given need to hold plavix  for procedure per  PCCm we will hold off .   Hypothyroidism Euthyroid TSH 4.0,continue synthroid  50mcg daily    Asthma, severe persistent: No signs of exacerbation, continue bronchodilators   Alzheimer's disease At baseline, Continue namenda . Keep on fall, delirium precautions    GERD: Continue protonix  daily    CODE STATUS/goals of care: Discussed with patient and daughter and she is a full code-confirm with family Consult palliative care given patient is having multiple acute issues in the setting of advanced age with malignancy  Mobility: PT Orders: Active  PT Follow up Rec: Home Health Pt ([facility])04/02/2024 1608   DVT prophylaxis: enoxaparin  (LOVENOX ) injection 40 mg Start: 03/30/24 2200 SCDs Start: 03/29/24 1721 Code Status:   Code Status: Full Code Family Communication: plan of care discussed with patient/daughter not at bedside. Patient status is: Remains hospitalized because of severity of illness Level of care: Telemetry   Dispo: The patient is from: home            Anticipated disposition: TBD Objective: Vitals last 24 hrs: Vitals:   04/02/24 0731 04/02/24 1115 04/02/24 1116 04/02/24 1329  BP:   130/86 (!) 142/85  Pulse: 97  (!) 102 98  Resp: 18  17 15   Temp:  97.8 F (36.6 C)  99.3 F (37.4 C)  TempSrc:  Oral  Axillary  SpO2: 94%  100% 98%  Weight:      Height:        Physical Examination: General exam: AAO, appears in discomfort, NGT+ HEENT:Oral mucosa moist, Ear/Nose WNL grossly Respiratory system: Bilaterally diminished at base, left-sided chest tube in place Cardiovascular system: S1 & S2 +, No JVD. Gastrointestinal system: Abdomen soft, moderately distended bowel sounds sluggish nontender  Nervous System: Alert, awake, moving all extremities,and following commands. Extremities: extremities warm, leg edema  NEG Skin: Warm, no rashes MSK: Normal muscle bulk,tone, power   Medications reviewed:  Scheduled Meds:  amLODipine   10 mg Oral Daily   docusate  100 mg  Per Tube Daily   enoxaparin  (LOVENOX ) injection  40 mg Subcutaneous Q24H   fluticasone   1 spray Each Nare Daily   fluticasone  furoate-vilanterol  1 puff Inhalation Daily   ipratropium  2 spray Each Nare TID   [START ON 04/08/2024] levothyroxine   37.5 mcg Intravenous Daily   loratadine   10 mg Oral Daily   memantine   10 mg Oral BID   montelukast   10 mg Oral QHS   pantoprazole  (PROTONIX ) IV  40 mg Intravenous Q12H   pravastatin   80 mg Oral Daily   sodium chloride  flush  10 mL Intrapleural Q8H   vitamin B-12  500 mcg Oral Daily   Continuous Infusions:  ceFEPime  (MAXIPIME ) IV 2 g (04/02/24 1524)   vancomycin  Stopped (04/01/24 2303)   Diet: Diet Order             Diet NPO time specified  Diet effective now                    Data Reviewed: I have personally reviewed following labs and imaging studies ( see epic result tab) CBC: Recent Labs  Lab 03/30/24 0426 03/31/24 0414 04/01/24 0404 04/02/24 0412 04/03/24 0445  WBC 24.0* 23.0* 18.6* 16.2* 19.2*  HGB 12.6 11.9* 11.6* 13.5 15.0  HCT 39.8 35.9* 35.0* 41.3 45.0  MCV 90.9 90.9 90.4 92.0 89.5  PLT 425* 413* 425*  461* 497*   CMP: Recent Labs  Lab 03/29/24 1835 03/30/24 0426 03/31/24 0414 04/01/24 0404 04/02/24 0412 04/03/24 0445  NA  --  138 136 137 139 143  K  --  3.5 3.5 3.2* 4.1 4.1  CL  --  104 103 103 105 104  CO2  --  22 19* 19* 17* 19*  GLUCOSE  --  121* 108* 93 124* 142*  BUN  --  10 15 17  24* 28*  CREATININE  --  0.65 0.65 0.63 0.75 0.81  CALCIUM   --  9.6 9.5 9.6 10.0 11.0*  MG 2.0  --   --  2.2  --   --    GFR: Estimated Creatinine Clearance: 46.5 mL/min (by C-G formula based on SCr of 0.81 mg/dL). Recent Labs  Lab 03/29/24 0926  AST 19  ALT 10  ALKPHOS 77  BILITOT 0.7  PROT 7.4  ALBUMIN  3.2*   No results for input(s): LIPASE, AMYLASE in the last 168 hours. No results for input(s): AMMONIA in the last 168 hours. Coagulation Profile:  Recent Labs  Lab 03/29/24 1835  INR 1.2    Unresulted Labs (From admission, onward)     Start     Ordered   04/06/24 0500  Creatinine, serum  (enoxaparin  (LOVENOX )    CrCl >/= 30 ml/min)  Weekly,   R     Comments: while on enoxaparin  therapy   Question:  Specimen collection method  Answer:  Lab=Lab collect   03/30/24 1505   04/01/24 1415  Cholesterol, body fluid  (Thoracentesis Labs Panel)  Once,   R        04/01/24 1415   04/01/24 1415  pH, Body Fluid [as Miscellaneous test (send-out)]  (Thoracentesis Labs Panel)  Once,   R       Comments: **SEND SPECIMEN ON ICE**  LAB ORDER:  PH, BODY FLUID - [id number] HAS BEEN DISCONTINUED AT [facility], AND IS NO LONGER AVAILABLE.THIS MISC. LAB ORDER SHOULD BE USED INSTEAD.  HOSPITAL LABORATORY WILL SEND TO [facility].  RESULT WILL DISPLAY IN RESULTS REVIEW MISCELLANEOUS TEST WITH A SCANNED REPORT LINK   Question:  Test name / description:  Answer:  PH, BODY FLUID / [facility] ORDER #[id number]   04/01/24 1415   04/01/24 1415  Fungus Culture With Stain  (Thoracentesis Labs Panel)  Once,   R        04/01/24 1415   03/31/24 0500  Basic metabolic panel with GFR  Daily,   R      03/30/24 0745   03/31/24 0500  CBC  Daily,   R      03/30/24 0745   03/29/24 1612  Expectorated Sputum Assessment w Gram Stain, Rflx to Resp Cult  Once,   R        03/29/24 1611           Antimicrobials/Microbiology: Anti-infectives (From admission, onward)    Start     Dose/Rate Route Frequency Ordered Stop   03/30/24 2200  vancomycin  (VANCOREADY) IVPB 750 mg/150 mL        750 mg 150 mL/hr over 60 Minutes Intravenous Every 24 hours 03/29/24 1620     03/30/24 0400  ceFEPIme  (MAXIPIME ) 2 g in sodium chloride  0.9 % 100 mL IVPB        2 g 200 mL/hr over 30 Minutes Intravenous Every 12 hours 03/29/24 1620     03/29/24 1530  vancomycin  (VANCOCIN ) IVPB 1000 mg/200 mL premix  1,000 mg 200 mL/hr over 60 Minutes Intravenous  Once 03/29/24 1517 03/30/24 0228   03/29/24 1530  ceFEPIme  (MAXIPIME ) 2 g in  sodium chloride  0.9 % 100 mL IVPB        2 g 200 mL/hr over 30 Minutes Intravenous  Once 03/29/24 1518 03/29/24 1724         Component Value Date/Time   SDES  04/01/2024 1415    PLEURAL Performed at [facility], [address] [place], [place] [address]    SPECREQUEST  04/01/2024 1415    LEFT LUNG Performed at [facility], [address], [place], [place] [address]    CULT  04/01/2024 1415    NO GROWTH 2 DAYS Performed at [facility], [address] [place], [place] [address]    REPTSTATUS PENDING 04/01/2024 1415    Procedures: Procedure(s) (LRB): CHEST TUBE INSERTION (Left)   Landon FORBES Baller, MD [facility] 04/03/2024, 9:13 AM

## 2024-04-03 NOTE — Progress Notes (Signed)
 Palliative Medicine Progress Note   Patient Name: Angelica [name]       Date: 04/03/2024 DOB: [birth year]  Age: 83 y.o. MRN#: [id number] Attending Physician: Carlota Landon BRAVO, MD Primary Care Physician: Norleen Lynwood ORN, MD Admit Date: 03/29/2024    HPI/Patient Profile: 83 y.o. 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  She presented to the ED on 03/29/2024 with weakness.  She is admitted with a large highly loculated left pleural effusion and small bowel obstruction.   Palliative Medicine has been consulted for goals of care discussions and complex medical decision making.   Subjective: Chart reviewed. Patient assessed. She is moaning, reporting pain, and is restless. She is confused, but able to answer some simple questions.   I met with daughter/Taron at bedside as a follow-up for palliative needs and emotional support. She reports that patient's current mental status is very different from baseline.   We briefly discussed the what ifs and encouraged daughter to consider what medical interventions patient would or would not want in the event her condition were to deteriorate, keeping in mind the concept of quality of life. The MOST form was introduced and discussed.   Questions and concerns were addressed. Emotional support provided. PMT contact info also provided.     Objective:  Physical Exam Vitals reviewed.  Constitutional:      General: She is awake. She is in acute distress.     Comments: Frail, chronically ill-appearing  HENT:     Head:     Comments: NG tube to suction Pulmonary:     Effort: Pulmonary effort is normal.      Comments: L chest tube Neurological:     Mental Status: She is confused.            LBM: Last BM Date : 04/02/24     Palliative Medicine Assessment & Plan   Assessment: Principal Problem:   Pleural effusion associated with pulmonary infection vs. recurrent malignancy and sepsis Active Problems:   HTN (hypertension)   GERD (gastroesophageal reflux disease)   Asthma, severe persistent, well-controlled (HCC)   Constipation   HLD (hyperlipidemia)   Adenocarcinoma of left lung, stage 3 (HCC)  Alzheimer's disease (HCC)   Hypothyroidism   History of stroke   Weakness   Elevated troponin   Sepsis (HCC)    Recommendations/Plan: Continue full scope care Pending pleural fluid cytology results  PMT will continue to follow   Primary Decision Maker: HCPOA   Existing Vynca/ACP Documentation: HCPOA document naming Marylene Masek (daughter) as primary healthcare agent and [name], [name]. as alternate   Code Status/Advance Care Planning: Full code     Symptom Management:  Per attending   Prognosis:  Unable to determine   Discharge Planning:  To Be Determined    Thank you for allowing the Palliative Medicine Team to assist in the care of this patient.   Time: 28 minutes   Recardo KATHEE Loll, NP   Please contact Palliative Medicine Team phone at [phone] for questions and concerns.  For individual providers, please see AMION.

## 2024-04-03 NOTE — Progress Notes (Signed)
 eLink Physician-Brief Progress Note Patient Name: Angelica [name] DOB: 04/04/1941 MRN: [id number]   Date of Service  04/03/2024  HPI/Events of Note  Patient is s/p exploratory laparotomy with small bowel repair / resection.  eICU Interventions  New Patient Evaluation.        Angelica [name] 04/03/2024, 7:33 PM

## 2024-04-03 NOTE — Progress Notes (Signed)
 ADDENDUM: All family members but one brother have arrived.  I had further discussions with family members regarding surgical vs nonsurgical options in addition to what Dr. Teresa had previously discussed.  We discussed quality of life after surgery, issues with eating post op, PNA, pulmonary issues, immobility issues, failure to thrive, etc.  We also discussed transition to comfort care and what that would look like if they chose that option as well.  All questions were asked and answered.  I left them to further discuss.  Prior to me leaving the unit, the patient's daughter, stopped me at the nurses station to say her mother woke up and stated she heard our entire conversation and that she wanted to pursue surgery.  She would like everything done.  We then discussed if she had ever discussed whether she would want a feeding gastrostomy tube as well.  The daughter, who is her HCPOA, stated that yes, the patient has stated that she would want a feeding tube.  I will discuss with Dr. Teresa about adding gastrostomy tube to the procedure as I anticipate the patient is going to have some FTT or lack of desire to eat post op given her current state.  Otherwise we will plan to proceed with ex lap with possible SBR, possible g-tube placement.  All are in agreement with this plan. She is posted and will go to the OR this afternoon and tx to ICU for post op care.  Burnard FORBES Banter 1:00 PM 04/03/2024

## 2024-04-04 ENCOUNTER — Inpatient Hospital Stay (HOSPITAL_COMMUNITY)

## 2024-04-04 DIAGNOSIS — J189 Pneumonia, unspecified organism: Secondary | ICD-10-CM | POA: Diagnosis not present

## 2024-04-04 DIAGNOSIS — Z515 Encounter for palliative care: Secondary | ICD-10-CM | POA: Diagnosis not present

## 2024-04-04 DIAGNOSIS — K56609 Unspecified intestinal obstruction, unspecified as to partial versus complete obstruction: Secondary | ICD-10-CM | POA: Diagnosis not present

## 2024-04-04 DIAGNOSIS — J910 Malignant pleural effusion: Secondary | ICD-10-CM | POA: Diagnosis not present

## 2024-04-04 DIAGNOSIS — C3492 Malignant neoplasm of unspecified part of left bronchus or lung: Secondary | ICD-10-CM | POA: Diagnosis not present

## 2024-04-04 DIAGNOSIS — J918 Pleural effusion in other conditions classified elsewhere: Secondary | ICD-10-CM | POA: Diagnosis not present

## 2024-04-04 DIAGNOSIS — Z7189 Other specified counseling: Secondary | ICD-10-CM | POA: Diagnosis not present

## 2024-04-04 LAB — CBC
HCT: 41.3 % (ref 36.0–46.0)
Hemoglobin: 13.7 g/dL (ref 12.0–15.0)
MCH: 29.5 pg (ref 26.0–34.0)
MCHC: 33.2 g/dL (ref 30.0–36.0)
MCV: 88.8 fL (ref 80.0–100.0)
Platelets: 454 K/uL — ABNORMAL HIGH (ref 150–400)
RBC: 4.65 MIL/uL (ref 3.87–5.11)
RDW: 14 % (ref 11.5–15.5)
WBC: 17.4 K/uL — ABNORMAL HIGH (ref 4.0–10.5)
nRBC: 0 % (ref 0.0–0.2)

## 2024-04-04 LAB — CHOLESTEROL, BODY FLUID: Cholesterol, Fluid: 72 mg/dL

## 2024-04-04 LAB — BODY FLUID CULTURE W GRAM STAIN: Culture: NO GROWTH

## 2024-04-04 LAB — BLOOD GAS, ARTERIAL
Acid-base deficit: 4.2 mmol/L — ABNORMAL HIGH (ref 0.0–2.0)
Bicarbonate: 20.1 mmol/L (ref 20.0–28.0)
O2 Content: 4 L/min
O2 Saturation: 100 %
Patient temperature: 36.6
pCO2 arterial: 33 mmHg (ref 32–48)
pH, Arterial: 7.39 (ref 7.35–7.45)
pO2, Arterial: 137 mmHg — ABNORMAL HIGH (ref 83–108)

## 2024-04-04 LAB — BASIC METABOLIC PANEL WITH GFR
Anion gap: 13 (ref 5–15)
BUN: 40 mg/dL — ABNORMAL HIGH (ref 8–23)
CO2: 17 mmol/L — ABNORMAL LOW (ref 22–32)
Calcium: 9.4 mg/dL (ref 8.9–10.3)
Chloride: 110 mmol/L (ref 98–111)
Creatinine, Ser: 1.01 mg/dL — ABNORMAL HIGH (ref 0.44–1.00)
GFR, Estimated: 55 mL/min — ABNORMAL LOW (ref >60)
Glucose, Bld: 127 mg/dL — ABNORMAL HIGH (ref 70–99)
Potassium: 4.5 mmol/L (ref 3.5–5.1)
Sodium: 140 mmol/L (ref 135–145)

## 2024-04-04 MED ORDER — MONTELUKAST SODIUM 10 MG PO TABS
10.0000 mg | ORAL_TABLET | Freq: Every day | ORAL | Status: DC
  Administered 2024-04-04 – 2024-04-09 (×5): 10 mg
  Filled 2024-04-04 (×5): qty 1

## 2024-04-04 MED ORDER — BUDESONIDE 0.5 MG/2ML IN SUSP
0.5000 mg | Freq: Two times a day (BID) | RESPIRATORY_TRACT | Status: DC
  Administered 2024-04-04 – 2024-04-23 (×37): 0.5 mg via RESPIRATORY_TRACT
  Filled 2024-04-04 (×38): qty 2

## 2024-04-04 MED ORDER — SODIUM CHLORIDE 0.9 % IV SOLN: INTRAVENOUS | Status: DC

## 2024-04-04 MED ORDER — ALBUTEROL SULFATE (2.5 MG/3ML) 0.083% IN NEBU: 2.5000 mg | INHALATION_SOLUTION | RESPIRATORY_TRACT | Status: DC | PRN

## 2024-04-04 NOTE — Progress Notes (Signed)
 Palliative Medicine Progress Note   Patient Name: Angelica [name]       Date: 04/04/2024 DOB: [birth year]  Age: 83 y.o. MRN#: [id number] Attending Physician: Cheryle Page, MD Primary Care Physician: Norleen Lynwood ORN, MD Admit Date: 03/29/2024  Reason for Consultation/Follow-up: {Reason for Consult:23484}  HPI/Patient Profile: 83 y.o. 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  She presented to the ED on 03/29/2024 with weakness.  She is admitted with a large highly loculated left pleural effusion and small bowel obstruction.   Palliative Medicine has been consulted for goals of care discussions and complex medical decision making.   Subjective: Chart reviewed. Patient is POD #1 exploratory laparotomy with lysis of adhesions, and small bowel resection/repair.   Patient assessed. She remains in ICU. She is lethargic, but is able to express if she is in pain.   I spoke with daughter/Tavon by phone as a follow-up for palliative care needs and emotional support.   Objective:  Physical Exam Vitals reviewed.  Constitutional:      General: She is not in acute distress.    Appearance: She is ill-appearing.  HENT:     Head:     Comments: NGT to suction Pulmonary:     Effort: Pulmonary effort is normal.     Comments: L chest tube Neurological:     Mental Status: She is lethargic.             Vital Signs: BP 92/60 (BP Location: Right Arm)   Pulse 93   Temp 98.8 F (37.1 C) (Axillary)   Resp 19   Ht 5' 5 (1.651 m)   Wt 59.6 kg   SpO2 94%   BMI 21.87 kg/m  SpO2: SpO2: 94 % O2 Device: O2 Device: Nasal Cannula O2 Flow Rate: O2 Flow Rate  (L/min): 3 L/min   Palliative Medicine Assessment & Plan   Assessment: Principal Problem:   Pleural effusion associated with pulmonary infection vs. recurrent malignancy and sepsis Active Problems:   HTN (hypertension)   GERD (gastroesophageal reflux disease)   Asthma, severe persistent, well-controlled (HCC)   Constipation   HLD (hyperlipidemia)   Adenocarcinoma of left lung, stage 3 (HCC)  Alzheimer's disease (HCC)   Hypothyroidism   History of stroke   Weakness   Elevated troponin   Sepsis (HCC)    Recommendations/Plan: Continue full scope care Pending pleural fluid cytology results  PMT will continue to follow   Primary Decision Maker: HCPOA   Existing Vynca/ACP Documentation: HCPOA document naming Nekia Maxham (daughter) as primary healthcare agent and [name], [name]. as alternate   Code Status/Advance Care Planning: Full code     Symptom Management:  Per attending   Prognosis:  Unable to determine   Discharge Planning:  To Be Determined   Care plan was discussed with ***  Thank you for allowing the Palliative Medicine Team to assist in the care of this patient.   ***   Recardo KATHEE Loll, NP   Please contact Palliative Medicine Team phone at [phone] for questions and concerns.  For individual providers, please see AMION.

## 2024-04-04 NOTE — Progress Notes (Signed)
 1 Day Post-Op ex lap and SBR Subjective: Sleeping comfortably in bed, easily arousable  Objective: Vital signs in last 24 hours: Temp:  [96.6 F (35.9 C)-98.9 F (37.2 C)] 98.7 F (37.1 C) (11/15 0033) Pulse Rate:  [94-118] 95 (11/15 0600) Resp:  [15-29] 21 (11/15 0600) BP: (90-120)/(54-76) 99/60 (11/15 0600) SpO2:  [95 %-100 %] 100 % (11/15 0600) Weight:  [59.6 kg] 59.6 kg (11/14 1327)   Intake/Output from previous day: 11/14 0701 - 11/15 0700 In: 609.8 [I.V.:10; IV Piggyback:599.8] Out: 605 [Urine:220; Emesis/NG output:200; Blood:50; Chest Tube:135] Intake/Output this shift: Total I/O In: 100 [IV Piggyback:100] Out: -    General appearance: alert and cooperative GI: soft  Incision: dressing clean, dry, intact  Lab Results:  Recent Labs    04/03/24 0445 04/04/24 0237  WBC 19.2* 17.4*  HGB 15.0 13.7  HCT 45.0 41.3  PLT 497* 454*   BMET Recent Labs    04/03/24 0445 04/04/24 0237  NA 143 140  K 4.1 4.5  CL 104 110  CO2 19* 17*  GLUCOSE 142* 127*  BUN 28* 40*  CREATININE 0.81 1.01*  CALCIUM  11.0* 9.4   PT/INR No results for input(s): LABPROT, INR in the last 72 hours. ABG No results for input(s): PHART, HCO3 in the last 72 hours.  Invalid input(s): PCO2, PO2  MEDS, Scheduled  amLODipine   10 mg Per Tube Daily   Chlorhexidine  Gluconate Cloth  6 each Topical Daily   vitamin B-12  500 mcg Oral Daily   enoxaparin  (LOVENOX ) injection  40 mg Subcutaneous Q24H   fluticasone   1 spray Each Nare Daily   fluticasone  furoate-vilanterol  1 puff Inhalation Daily   ipratropium  2 spray Each Nare TID   [START ON 04/08/2024] levothyroxine   37.5 mcg Intravenous Daily   loratadine   10 mg Per Tube Daily   memantine   10 mg Per Tube BID   montelukast   10 mg Oral QHS   pantoprazole  (PROTONIX ) IV  40 mg Intravenous Q12H   pravastatin   80 mg Per Tube Daily   sodium chloride  flush  10 mL Intrapleural Q8H    Studies/Results: DG Abd 1 View Result Date:  04/03/2024 EXAM: 1 VIEW XRAY OF THE ABDOMEN 04/03/2024 07:50:00 AM COMPARISON: 04/02/2024 CLINICAL HISTORY: SBO (small bowel obstruction) (HCC) FINDINGS: LINES, TUBES AND DEVICES: Enteric tube is similarly positioned with the tip in the gastric antrum. Partially visualized pigtail drainage catheter in lower left chest. BOWEL: Severe dilation of multiple segments of small bowel in the upper abdomen measuring up to 7.9 cm in diameter. There is enteric contrast present, but most of this contrast appears to be within the small bowel and has not reached the colon since yesterday. SOFT TISSUES: No opaque urinary calculi. BONES: No acute osseous abnormality. IMPRESSION: 1. Persistent severe dilation of multiple segments of small bowel in the mid to upper abdomen measuring up to 7.9 cm in diameter, with enteric contrast present, but not definitively reaching the colon. These findings remain especially worrisome for a closed-loop small bowel obstruction. 2. These results will be called to the ordering clinician or representative by the Radiologist Assistant and communication documented in the PACS or [employer]. Electronically signed by: Rogelia Myers MD 04/03/2024 08:27 AM EST RP Workstation: [id number]   DG CHEST PORT 1 VIEW Result Date: 04/03/2024 EXAM: 1 VIEW(S) XRAY OF THE CHEST 04/03/2024 05:33:00 AM COMPARISON: Portable chest dated 04/02/2024 07:52 AM. CLINICAL HISTORY: [id number] Pneumothorax [id number] Pneumothorax FINDINGS: LINES, TUBES AND DEVICES: Pigtail tube thoracostomy in the lower  lateral left chest stable in position, but appears kinked in 2 places outside the chest. NGT is well inside the stomach, but the side hole and tip are out of view. LUNGS AND PLEURA: There is continued improvement and left basilar consolidation/atelectasis with haziness and mild interstitial coarsening remaining. The remaining lungs although hypoinflated appear clear. There is no measurable pneumothorax. There is no substantial  pleural effusion. HEART AND MEDIASTINUM: The cardiomediastinal silhouette is normal. There is aortic atherosclerosis. BONES AND SOFT TISSUES: Thoracic spondylosis. Old right hemithyroidectomy. No acute osseous abnormality. IMPRESSION: 1. No measurable pneumothorax. 2. Pigtail tube thoracostomy in the lower lateral left chest is stable in position but appears kinked in two places outside the chest. 3. Continued improvement in left basilar consolidation/atelectasis with residual haziness and mild interstitial coarsening. Electronically signed by: Francis Quam MD 04/03/2024 05:55 AM EST RP Workstation: [id number]   DG Abd Portable 1V Result Date: 04/02/2024 CLINICAL DATA:  Small bowel obstruction. EXAM: PORTABLE ABDOMEN - 1 VIEW COMPARISON:  1 day prior FINDINGS: Markedly dilated opacified small bowel loops in the central abdomen and upper pelvis are similar to prior. The does appear to be some gas in the transverse colon and splenic flexure although no colonic contrast evident. NG tube remains in place with catheter either folded back on itself in the gastric antrum or potentially with the tip of the NG tube in a transpyloric position in the proximal duodenum. Left base collapse/consolidation with effusion evident with incomplete visualization of the left chest tube. IMPRESSION: 1. Markedly dilated opacified small bowel loops in the central abdomen and upper pelvis are similar to prior. There does appear to be some gas in the transverse colon and splenic flexure although no colonic contrast evident. Imaging features remain compatible with small bowel obstruction. 2. NG tube remains in place with catheter either folded back on itself in the gastric antrum or potentially with the tip of the NG tube in a transpyloric position in the proximal duodenum. Electronically Signed   By: Camellia Candle M.D.   On: 04/02/2024 08:32   DG CHEST PORT 1 VIEW Result Date: 04/02/2024 CLINICAL DATA:  Chest tube. EXAM: PORTABLE  CHEST 1 VIEW COMPARISON:  04/01/2024 FINDINGS: 0752 hours. Interval improvement in aeration of the left lung with decreased opacity over the left mid and lower hemithorax. Left chest tube remains in place without discernible left-sided pneumothorax. Left base collapse/consolidation with small effusion evident. Right lung clear. Cardiopericardial silhouette is at upper limits of normal for size. The NG tube passes into the stomach although the distal tip position is not included on the film. Telemetry leads overlie the chest. IMPRESSION: 1. Interval improvement in aeration of the left lung with decreased opacity over the left mid and lower hemithorax. 2. Left base collapse/consolidation with small effusion. Electronically Signed   By: Camellia Candle M.D.   On: 04/02/2024 08:21    Assessment: s/p Procedure(s): EXPLORATORY LAPAROTOMY, LYSIS OF ADHESIONS, SMALL BOWEL RESECTION, SMALL BOWEL REPAIR Patient Active Problem List   Diagnosis Date Noted   Pleural effusion associated with pulmonary infection vs. recurrent malignancy and sepsis 03/29/2024   Elevated troponin 03/29/2024   Sepsis (HCC) 03/29/2024   Low back pain 10/13/2023   Arthritis of right knee 06/20/2023   Corn of toe 06/20/2023   Mastodynia of left breast 02/13/2023   Pleural effusion 11/21/2022   Swelling of breast 11/17/2022   Dyspnea 11/14/2022   Iron  deficiency anemia 10/11/2022   Acute respiratory failure with hypoxia (HCC) 10/05/2022   Bilateral  pleural effusion 10/05/2022   History of stroke 10/05/2022   Weakness 10/05/2022   Edema 10/05/2022   Alzheimer's disease (HCC) 09/06/2022   Ex-smoker 09/06/2022   Hypothyroidism 09/06/2022   Right leg pain 07/13/2021   Dementia (HCC) 07/13/2021   RLQ abdominal pain 07/13/2021   Anosmia 05/01/2021   Chronic ethmoidal sinusitis 02/14/2021   Debility 02/01/2021   Gait disorder 02/01/2021   Port-A-Cath in place 11/02/2020   Adenocarcinoma of left lung, stage 3 (HCC) 06/22/2020    Lung mass    Pulmonary nodule 05/17/2020   Iron  deficiency 05/03/2020   Swelling of lower extremity 07/30/2019   Ganglion cyst of right foot 01/16/2019   HLD (hyperlipidemia) 01/16/2019   LPRD (laryngopharyngeal reflux disease) 07/25/2018   Constipation 08/19/2017   Asthma, severe persistent, well-controlled (HCC) 06/04/2017   Chronic pansinusitis 06/04/2017   Nasal polyposis 06/04/2017   Osteopenia 05/26/2017   GERD (gastroesophageal reflux disease)    H. pylori infection    Other allergic rhinitis 03/20/2016   Malnutrition of moderate degree 04/11/2015   HTN (hypertension) 04/10/2015    SBO s/p Exlap and SBR on 11/14 by Dr Teresa  Plan: Cont NPO/NG/IVFs VTE - Plavix  on hold, lovenox  SQ ID - Vanc, maxipime    HTN HLD h/o CVA Alzheimer's disease (although oriented currently) Stage III adenocarcinoma of lung GERD Hypothyroidism  LOS: 6 days     .Angelica [name] Ned, MD [facility], [place]    04/04/2024 7:53 AM

## 2024-04-04 NOTE — Progress Notes (Signed)
 NAME:  Angelica [name], MRN:  [id number], DOB:  1941/03/04, LOS: 6 ADMISSION DATE:  03/29/2024, CONSULTATION DATE:  04/04/24 REFERRING MD:  Dr. Christobal, CHIEF COMPLAINT:  Pleural Effusion   History of Present Illness:   Angelica [name] is an 83 y/o F with a PMH significant for GERD, HTN, HLD, stage 3B NSCLC, hypothyroidism, Alzheimer's dementia who presented for weakness and shortness of breath now found to have a large loculated left pleural effusion. Pulmonology consulted for evaluation and management of the latter.  History mostly obtained from discussion with daughter and chart review. Per daughter, patient developing worsening shortness of breath to the point she was unable to walk without getting really winded. She also noted some chest pressure/pain. Daughter called 911. The patient was brought into the ED and CT C/A/P showed a large left sided loculated pleural effusion.   Of note, patient previously had thoracentesis on left side 10/07/2022. Cytology negative for malignancy at that time. Lymphocytic effusion. LDH low at 71. Protein 3.2.  Pertinent  Medical History   Past Medical History:  Diagnosis Date   Allergy 05/21/1978   Arthritis    Asthma    Bowel obstruction (HCC)    Cancer (HCC)    Cataract    Environmental allergies    GERD (gastroesophageal reflux disease)    Glaucoma 05/09/2017   H. pylori infection    History of radiation therapy 07/05/20-08/05/20   Left lung IMRT Dr. Shannon    HLD (hyperlipidemia) 01/16/2019   Hypertension    Iron  deficiency anemia    Osteopenia 05/26/2017   Thyroid  disease    Urticaria 07/25/2018   Significant Hospital Events: Including procedures, antibiotic start and stop dates in addition to other pertinent events   Patient admitted to [facility], plavix  dose given on 11/10, discontinued plavix  order. 03/31/2024, diagnosed with SBO, placed NGT to LIS, CT ABD/PLV shows SBO with proximal transition point beyond ligament of Treitz. CT re-demonstrates left  pleural effusion. 04/01/24 --> pigtail chest tube placed, first dose of TPA/Dornase, 1500 mL out since procedure, no air leak. 04/02/24 --> second dose of TPA/Dornase given, 1000 mL out, no air leak 04/03/24 --> CXR looked good, no TPA/Dornase, left CT to drain, went to OR for SBO, see surgical procedure notes for details, admitted overnight to ICU for monitoring.  Interim History / Subjective:  Patient did fairly well overnight after surgery, not on pressors, not on oxygen therapy. CT drained 135 mL in last 24 hours.  Objective    Blood pressure (!) 107/59, pulse 88, temperature 98.8 F (37.1 C), temperature source Axillary, resp. rate 20, height 5' 5 (1.651 m), weight 59.6 kg, SpO2 90%.        Intake/Output Summary (Last 24 hours) at 04/04/2024 1238 Last data filed at 04/04/2024 1129 Gross per 24 hour  Intake 709.83 ml  Output 975 ml  Net -265.17 ml   Filed Weights   03/29/24 2345 03/30/24 0626 04/03/24 1327  Weight: 59.6 kg 59.6 kg 59.6 kg    Examination: General: alert, not oriented, appears relatively comfortable Eyes: PERRL, no scleral icterus ENMT: oropharynx clear, good dentition, no oral lesions, mallampati score I Neck: JVD flat CV: RRR, no MRG, nl S1 and S2, no peripheral edema Resp: clear to auscultation bilaterally Abdom: surgical site appears c/d/i Neuro: Awake alert oriented to person place time and situation  Resolved problem list   Assessment and Plan   #Large Loculated Pleural Effusion: > Unclear etiology. Ddx includes infectious etiology versus malignant. Suspect maybe infectious  given cell count has 2600 WBCs, 90% PMNs and pleural fluid LDH is 454 suggestive of complicated parapneumonic effusion. Pleural culture without growth to date. Cytology is without malignant cells. CXR reviewed this morning and shows marked improvement in pleural effusion. - I have removed chest tube today given that there was very minimal output and CXR shows resolution of  pleural effusion. - Antimicrobial plan per primary medical team.  #Hx of CVA: - Continue to hold plavix .  #SBO: - Plan per surgery.  Labs   CBC: Recent Labs  Lab 03/31/24 0414 04/01/24 0404 04/02/24 0412 04/03/24 0445 04/04/24 0237  WBC 23.0* 18.6* 16.2* 19.2* 17.4*  HGB 11.9* 11.6* 13.5 15.0 13.7  HCT 35.9* 35.0* 41.3 45.0 41.3  MCV 90.9 90.4 92.0 89.5 88.8  PLT 413* 425* 461* 497* 454*    Basic Metabolic Panel: Recent Labs  Lab 03/29/24 1835 03/30/24 0426 03/31/24 0414 04/01/24 0404 04/02/24 0412 04/03/24 0445 04/04/24 0237  NA  --    < > 136 137 139 143 140  K  --    < > 3.5 3.2* 4.1 4.1 4.5  CL  --    < > 103 103 105 104 110  CO2  --    < > 19* 19* 17* 19* 17*  GLUCOSE  --    < > 108* 93 124* 142* 127*  BUN  --    < > 15 17 24* 28* 40*  CREATININE  --    < > 0.65 0.63 0.75 0.81 1.01*  CALCIUM   --    < > 9.5 9.6 10.0 11.0* 9.4  MG 2.0  --   --  2.2  --   --   --    < > = values in this interval not displayed.   GFR: Estimated Creatinine Clearance: 37.3 mL/min (A) (by C-G formula based on SCr of 1.01 mg/dL (H)). Recent Labs  Lab 03/29/24 1216 03/29/24 1835 03/30/24 0426 03/31/24 0414 04/01/24 0404 04/02/24 0412 04/03/24 0445 04/04/24 0237  PROCALCITON  --  0.76 0.74  --   --   --   --   --   WBC  --   --  24.0*   < > 18.6* 16.2* 19.2* 17.4*  LATICACIDVEN 2.1*  --   --   --   --   --   --   --    < > = values in this interval not displayed.    Liver Function Tests: Recent Labs  Lab 03/29/24 0926  AST 19  ALT 10  ALKPHOS 77  BILITOT 0.7  PROT 7.4  ALBUMIN  3.2*   No results for input(s): LIPASE, AMYLASE in the last 168 hours. No results for input(s): AMMONIA in the last 168 hours.  ABG    Component Value Date/Time   TCO2 25 08/22/2016 1818     Coagulation Profile: Recent Labs  Lab 03/29/24 1835  INR 1.2    Cardiac Enzymes: No results for input(s): CKTOTAL, CKMB, CKMBINDEX, TROPONINI in the last 168  hours.  HbA1C: Hgb A1c MFr Bld  Date/Time Value Ref Range Status  07/18/2021 08:35 AM 5.8 4.6 - 6.5 % Final    Comment:    Glycemic Control Guidelines for People with Diabetes:Non Diabetic:  <6%Goal of Therapy: <7%Additional Action Suggested:  >8%   05/03/2020 05:07 PM 5.8 4.6 - 6.5 % Final    Comment:    Glycemic Control Guidelines for People with Diabetes:Non Diabetic:  <6%Goal of Therapy: <7%Additional Action Suggested:  >8%  CBG: Recent Labs  Lab 03/29/24 0958  GLUCAP 110*    Review of Systems:   Not obtained  Past Medical History:  She,  has a past medical history of Allergy (05/21/1978), Arthritis, Asthma, Bowel obstruction (HCC), Cancer (HCC), Cataract, Environmental allergies, GERD (gastroesophageal reflux disease), Glaucoma (05/09/2017), H. pylori infection, History of radiation therapy (07/05/20-08/05/20), HLD (hyperlipidemia) (01/16/2019), Hypertension, Iron  deficiency anemia, Osteopenia (05/26/2017), Thyroid  disease, and Urticaria (07/25/2018).   Surgical History:   Past Surgical History:  Procedure Laterality Date   ABDOMINAL HYSTERECTOMY     BOWEL RESECTION N/A 05/23/2020   Procedure: SMALL BOWEL REPAIR;  Surgeon: Kinsinger, Herlene Righter, MD;  Location: [facility] OR;  Service: General;  Laterality: N/A;   BREAST BIOPSY     BRONCHIAL BIOPSY  06/10/2020   Procedure: BRONCHIAL BIOPSIES;  Surgeon: Brenna Adine CROME, DO;  Location: [facility];  Service: Pulmonary;;   BRONCHIAL BRUSHINGS  06/10/2020   Procedure: BRONCHIAL BRUSHINGS;  Surgeon: Brenna Adine CROME, DO;  Location: [facility];  Service: Pulmonary;;   BRONCHIAL NEEDLE ASPIRATION BIOPSY  06/10/2020   Procedure: BRONCHIAL NEEDLE ASPIRATION BIOPSIES;  Surgeon: Brenna Adine CROME, DO;  Location: [facility];  Service: Pulmonary;;   BRONCHIAL WASHINGS  06/10/2020   Procedure: BRONCHIAL WASHINGS;  Surgeon: Brenna Adine CROME, DO;  Location: [facility];  Service: Pulmonary;;   CHEST TUBE INSERTION Left 04/01/2024    Procedure: CHEST TUBE INSERTION;  Surgeon: Catherine Cools, MD;  Location: [facility];  Service: Pulmonary;  Laterality: Left;   COLON SURGERY     DIAGNOSTIC LARYNGOSCOPY N/A 05/23/2020   Procedure: ATTEMPTED DIAGNOSTIC LAPAROSCOPY WITH ADHESIONS;  Surgeon: Kinsinger, Herlene Righter, MD;  Location: [facility] OR;  Service: General;  Laterality: N/A;   EYE SURGERY     IR IMAGING GUIDED PORT INSERTION  07/15/2020   IR REMOVAL TUN ACCESS W/ PORT W/O FL MOD SED  01/16/2023   KNEE SURGERY     LAPAROTOMY N/A 04/18/2015   Procedure: Exploratory laparotomy with lysis of adhesions, possible bowel resection;  Surgeon: Lynda Leos, MD;  Location: [facility] OR;  Service: General;  Laterality: N/A;   LAPAROTOMY N/A 05/23/2020   Procedure: EXPLORATORY LAPAROTOMY;  Surgeon: Stevie Herlene Righter, MD;  Location: [facility] OR;  Service: General;  Laterality: N/A;   LYSIS OF ADHESION N/A 05/23/2020   Procedure: LYSIS OF ADHESION;  Surgeon: Stevie Herlene Righter, MD;  Location: [facility] OR;  Service: General;  Laterality: N/A;   NASAL SINUS SURGERY     SMALL INTESTINE SURGERY     THORACENTESIS Left 12/03/2022   Procedure: THORACENTESIS;  Surgeon: Brenna Adine CROME, DO;  Location: [facility];  Service: Pulmonary;  Laterality: Left;   THYROID  SURGERY     VIDEO BRONCHOSCOPY WITH ENDOBRONCHIAL NAVIGATION Bilateral 06/10/2020   Procedure: VIDEO BRONCHOSCOPY WITH ENDOBRONCHIAL NAVIGATION;  Surgeon: Brenna Adine CROME, DO;  Location: [facility];  Service: Pulmonary;  Laterality: Bilateral;   VIDEO BRONCHOSCOPY WITH ENDOBRONCHIAL ULTRASOUND  06/10/2020   Procedure: VIDEO BRONCHOSCOPY WITH ENDOBRONCHIAL ULTRASOUND;  Surgeon: Brenna Adine CROME, DO;  Location: [facility];  Service: Pulmonary;;     Social History:   reports that she quit smoking about 53 years ago. Her smoking use included cigarettes. She has been exposed to tobacco smoke. She has never used smokeless tobacco. She reports that she does not drink alcohol and does not use drugs.    Family History:  Her family history includes Diabetes in her mother; Glaucoma in her brother and sister; Hypertension in her father and mother. There is no history of  Allergic rhinitis, Angioedema, Asthma, Eczema, Immunodeficiency, Urticaria, BRCA 1/2, or Breast cancer.   Allergies Allergies  Allergen Reactions   Asa Buff (Mag [Buffered Aspirin] Other (See Comments)    Excessive sweating   Ensure [Nutritional Supplements]     Or boost - upset stomach    Fish Allergy Other (See Comments)    Unknown reaction, but allergic   Other Other (See Comments)    Gelcaps; gel-containing capsules; extended-release medication - upset stomach    Peanut-Containing Drug Products Other (See Comments)    From allergy test   Penicillins Itching   Shellfish Protein-Containing Drug Products Other (See Comments)    From allergy test   Strawberry Extract Nausea And Vomiting and Other (See Comments)    From allergy test      Home Medications  Prior to Admission medications   Medication Sig Start Date End Date Taking? Authorizing Provider  acetaminophen  (TYLENOL ) 500 MG tablet Take 1,000 mg by mouth every 6 (six) hours as needed for moderate pain.   Yes [provider]  albuterol  (VENTOLIN  HFA) 108 (90 Base) MCG/ACT inhaler Inhale 2 puffs into the lungs every 4 (four) hours as needed for wheezing or shortness of breath. 01/03/24  Yes Padgett, Danita Macintosh, MD  alum & mag hydroxide-simeth (MAALOX/MYLANTA) 200-200-20 MG/5ML suspension Take 15 mLs by mouth every 6 (six) hours as needed for indigestion or heartburn.   Yes [provider]  amLODipine  (NORVASC ) 10 MG tablet Take 1 tablet (10 mg total) by mouth daily. 06/17/23  Yes Norleen Lynwood ORN, MD  benralizumab  (FASENRA ) 30 MG/ML prefilled syringe Inject 1 mL (30 mg total) into the skin every 8 (eight) weeks. 07/15/23  Yes Padgett, Danita Macintosh, MD  cetirizine  [facility] ALLERGY RELIEF, CETIRIZINE ,) 10 MG tablet Take 1 tablet (10 mg total) by  mouth daily. 01/03/24  Yes Jeneal Danita Macintosh, MD  clopidogrel  (PLAVIX ) 75 MG tablet Take 1 tablet by mouth once daily 07/18/23  Yes Norleen Lynwood ORN, MD  diclofenac  Sodium (VOLTAREN ) 1 % GEL Apply 4 g topically 4 (four) times daily. Patient taking differently: Apply 4 g topically 4 (four) times daily as needed (pain). 04/23/22  Yes Corey, Evan S, MD  EPINEPHrine  (EPIPEN  2-PAK) 0.3 mg/0.3 mL IJ SOAJ injection Use as directed for severe allergic reaction 01/03/24  Yes Padgett, Danita Macintosh, MD  fluticasone  (FLONASE ) 50 MCG/ACT nasal spray USE 1 SPRAY(S) IN EACH NOSTRIL IN THE MORNING AND AT BEDTIME Patient taking differently: Place 1 spray into both nostrils daily. 01/14/24  Yes Jeneal Danita Macintosh, MD  ipratropium (ATROVENT ) 0.06 % nasal spray Place 2 sprays into both nostrils 3 (three) times daily as needed for rhinitis. 01/03/24  Yes Padgett, Danita Macintosh, MD  levothyroxine  (SYNTHROID ) 50 MCG tablet TAKE 1 TABLET BY MOUTH ONCE DAILY BEFORE BREAKFAST 03/10/24  Yes Norleen Lynwood ORN, MD  memantine  (NAMENDA ) 10 MG tablet Take 1 tablet (10 mg total) by mouth 2 (two) times daily. 10/30/23  Yes McCue, Harlene, NP  montelukast  (SINGULAIR ) 10 MG tablet Take 1 tablet (10 mg total) by mouth at bedtime. 01/03/24  Yes Padgett, Danita Macintosh, MD  ondansetron  (ZOFRAN -ODT) 4 MG disintegrating tablet Take 1 tablet (4 mg total) by mouth every 8 (eight) hours as needed for nausea or vomiting. 07/09/22  Yes Merlynn Eland F, FNP  pantoprazole  (PROTONIX ) 40 MG tablet Take 1 tablet by mouth once daily 12/30/23  Yes Norleen Lynwood ORN, MD  pravastatin  (PRAVACHOL ) 80 MG tablet Take 1 tablet by mouth once daily 04/29/23  Yes  Norleen Lynwood ORN, MD  SYMBICORT  80-4.5 MCG/ACT inhaler INHALE 2 PUFFS BY MOUTH IN THE MORNING 01/03/24  Yes Padgett, Danita Macintosh, MD  gentamicin  ointment (GARAMYCIN ) 0.1 % Apply 1 Application topically daily. Apply to wound daily between toes.  Use a very small amount. Patient not taking: Reported  on 03/10/2024 08/27/23   McCaughan, Dia D, DPM  Magnesium  Hydroxide (PHILLIPS MILK OF MAGNESIA PO) Take 15-30 mLs by mouth daily as needed (for constipation).    [provider]  potassium chloride  SA (KLOR-CON  M) 20 MEQ tablet Take 2 tablets (40 mEq total) by mouth daily as needed. Take potasium days you take lasix . Patient not taking: Reported on 03/10/2024 01/03/23   Norleen Lynwood ORN, MD     Thank you for this interesting consult. I have spent 40 minutes evaluating patient, reviewing chart, and discussing plan of care with patient, family, and primary medical team. If you have any questions or concerns please reach out to me via pager ([phone]).  Paula Southerly, MD [facility]

## 2024-04-04 NOTE — Progress Notes (Signed)
 PROGRESS NOTE    Angelica [name]  FMW:[id number] DOB: 1940-10-25 DOA: 03/29/2024 PCP: Norleen Lynwood ORN, MD   Brief Narrative:  83 y.o. female with PMH of GERD, HTN, HLD, IDA, asthma, stage 3b adenocarcinoma of the lung, hypothyroidism, hx of CVA, Alzheimer's, hx of SBO due to adhesions in 2016 and 2021 presented with complaints of breast being heavy or swollen.  On presentation, she had WBCs of 21.8, lactic acid of 2.1.  Chest x-ray showed near complete opacification of left lung.  CT head was negative for any acute finding.  CT of abdomen and pelvis showed large highly loculated left pleural effusion along with moderate stool burden and rectal prolapse.  Patient was started on IV antibiotics, pulmonary was consulted.  She underwent left-sided chest tube on 04/01/2024 by pulmonary.  Subsequently she developed SBO: General Surgery was consulted.  NG tube was placed and patient was made NPO.  She underwent pleural fibrinolysis x 2 by PCCM.  She subsequently underwent expiratory laparotomy with lysis of adhesions, small bowel resection and small bowel repair on 04/03/2024 by general surgery.  Assessment & Plan:   Small bowel obstruction in a patient with previous SBO x 2 -underwent expiratory laparotomy with lysis of adhesions, small bowel resection and small bowel repair on 04/03/2024 by general surgery. -Pain management/diet advancement/wound care as per general surgery.  Large highly loculated left pleural effusion Acute respiratory failure with hypoxia - Pulmonary following and managing chest tube. underwent pleural fibrinolysis x 2 by PCCM. -Currently on broad-spectrum antibiotics as well. - Currently on 3 L oxygen via nasal cannula  History of adenocarcinoma of the lung, stage III status post chemoradiation followed by immunotherapy - Currently on observation.  Outpatient follow-up with oncology  Leukocytosis -Improving.  Monitor  Thrombocytosis - Possibly reactive.  Monitor  intermittently  Acute metabolic acidosis - Monitor  Elevated troponin: Possibly from demand ischemia -Troponins did not trend upwards.  No further workup needed.  Hypertension -Blood pressure intermittently on the lower side.  Currently on amlodipine   Hypothyroidism -Levothyroxine  has been switched to IV for now  History of stroke? Hyperlipidemia - Continue statin.  Has allergy to aspirin.  Plavix  on hold for now.  Outpatient follow-up with neurology  Asthma, severe persistent - No signs of exacerbation.  Continue bronchodilators  Alzheimer's disease - Continue Namenda .  Delirium precautions.  Fall precautions.  GERD - Continue IV Protonix     DVT prophylaxis: Lovenox  Code Status: Full Family Communication: None at bedside Disposition Plan: Status is: Inpatient Remains inpatient appropriate because: Of severity of illness    Consultants: PCCM/general surgery.  Consult palliative care  Procedures: As above  Antimicrobials:  Anti-infectives (From admission, onward)    Start     Dose/Rate Route Frequency Ordered Stop   04/03/24 1330  metroNIDAZOLE  (FLAGYL ) IVPB 500 mg        500 mg 100 mL/hr over 60 Minutes Intravenous On call to O.R. 04/03/24 1242 04/03/24 1507   03/30/24 2200  vancomycin  (VANCOREADY) IVPB 750 mg/150 mL  Status:  Discontinued        750 mg 150 mL/hr over 60 Minutes Intravenous Every 24 hours 03/29/24 1620 04/03/24 1310   03/30/24 0400  ceFEPIme  (MAXIPIME ) 2 g in sodium chloride  0.9 % 100 mL IVPB        2 g 200 mL/hr over 30 Minutes Intravenous Every 12 hours 03/29/24 1620     03/29/24 1530  vancomycin  (VANCOCIN ) IVPB 1000 mg/200 mL premix        1,000  mg 200 mL/hr over 60 Minutes Intravenous  Once 03/29/24 1517 03/30/24 0228   03/29/24 1530  ceFEPIme  (MAXIPIME ) 2 g in sodium chloride  0.9 % 100 mL IVPB        2 g 200 mL/hr over 30 Minutes Intravenous  Once 03/29/24 1518 03/29/24 1724        Subjective: Patient seen and examined at  bedside.  Wakes up slightly, hardly answers any questions.  No fever, agitation, seizures reported.  Objective: Vitals:   04/04/24 0927 04/04/24 0952 04/04/24 1000 04/04/24 1129  BP: (!) 103/56  (!) 103/50 105/69  Pulse: 90 90 91 92  Resp: 19 20 16 14   Temp:      TempSrc:      SpO2: 98% 99% 99% 100%  Weight:      Height:        Intake/Output Summary (Last 24 hours) at 04/04/2024 1144 Last data filed at 04/04/2024 1129 Gross per 24 hour  Intake 709.83 ml  Output 725 ml  Net -15.17 ml   Filed Weights   03/29/24 2345 03/30/24 0626 04/03/24 1327  Weight: 59.6 kg 59.6 kg 59.6 kg    Examination:  General exam: Appears calm and comfortable.  Looks chronically ill and deconditioned Respiratory system: Bilateral decreased breath sounds at bases with scattered crackles Cardiovascular system: S1 & S2 heard, Rate controlled Gastrointestinal system: Abdomen is distended, soft and mildly tender.  Dressing present.   Extremities: No cyanosis, clubbing, edema  Central nervous system: Wakes up slightly, extremely slow to respond, poor historian.  No focal neurological deficits. Moving extremities Skin: No rashes, lesions or ulcers Psychiatry: Flat affect.  Not agitated.   Data Reviewed: I have personally reviewed following labs and imaging studies  CBC: Recent Labs  Lab 03/31/24 0414 04/01/24 0404 04/02/24 0412 04/03/24 0445 04/04/24 0237  WBC 23.0* 18.6* 16.2* 19.2* 17.4*  HGB 11.9* 11.6* 13.5 15.0 13.7  HCT 35.9* 35.0* 41.3 45.0 41.3  MCV 90.9 90.4 92.0 89.5 88.8  PLT 413* 425* 461* 497* 454*   Basic Metabolic Panel: Recent Labs  Lab 03/29/24 1835 03/30/24 0426 03/31/24 0414 04/01/24 0404 04/02/24 0412 04/03/24 0445 04/04/24 0237  NA  --    < > 136 137 139 143 140  K  --    < > 3.5 3.2* 4.1 4.1 4.5  CL  --    < > 103 103 105 104 110  CO2  --    < > 19* 19* 17* 19* 17*  GLUCOSE  --    < > 108* 93 124* 142* 127*  BUN  --    < > 15 17 24* 28* 40*  CREATININE  --     < > 0.65 0.63 0.75 0.81 1.01*  CALCIUM   --    < > 9.5 9.6 10.0 11.0* 9.4  MG 2.0  --   --  2.2  --   --   --    < > = values in this interval not displayed.   GFR: Estimated Creatinine Clearance: 37.3 mL/min (A) (by C-G formula based on SCr of 1.01 mg/dL (H)). Liver Function Tests: Recent Labs  Lab 03/29/24 0926  AST 19  ALT 10  ALKPHOS 77  BILITOT 0.7  PROT 7.4  ALBUMIN  3.2*   No results for input(s): LIPASE, AMYLASE in the last 168 hours. No results for input(s): AMMONIA in the last 168 hours. Coagulation Profile: Recent Labs  Lab 03/29/24 1835  INR 1.2   Cardiac Enzymes: No results for  input(s): CKTOTAL, CKMB, CKMBINDEX, TROPONINI in the last 168 hours. BNP (last 3 results) No results for input(s): PROBNP in the last 8760 hours. HbA1C: No results for input(s): HGBA1C in the last 72 hours. CBG: Recent Labs  Lab 03/29/24 0958  GLUCAP 110*   Lipid Profile: No results for input(s): CHOL, HDL, LDLCALC, TRIG, CHOLHDL, LDLDIRECT in the last 72 hours. Thyroid  Function Tests: No results for input(s): TSH, T4TOTAL, FREET4, T3FREE, THYROIDAB in the last 72 hours. Anemia Panel: No results for input(s): VITAMINB12, FOLATE, FERRITIN, TIBC, IRON , RETICCTPCT in the last 72 hours. Sepsis Labs: Recent Labs  Lab 03/29/24 1216 03/29/24 1835 03/30/24 0426  PROCALCITON  --  0.76 0.74  LATICACIDVEN 2.1*  --   --     Recent Results (from the past 240 hours)  Culture, blood (Routine X 2) w Reflex to ID Panel     Status: None   Collection Time: 03/29/24 11:47 AM   Specimen: BLOOD  Result Value Ref Range Status   Specimen Description   Final    BLOOD BLOOD LEFT ARM Performed at [facility], [address] [place], [place] [address]    Special Requests   Final    BOTTLES DRAWN AEROBIC AND ANAEROBIC Blood Culture results may not be optimal due to an inadequate volume of blood received in culture  bottles Performed at [facility], [address] [place], [place] [address]    Culture   Final    NO GROWTH 5 DAYS Performed at [facility], [address], [place], [place] [address]    Report Status 04/03/2024 FINAL  Final  MRSA Next Gen by PCR, Nasal     Status: None   Collection Time: 03/30/24  1:01 PM   Specimen: Nasal Mucosa; Nasal Swab  Result Value Ref Range Status   MRSA by PCR Next Gen NOT DETECTED NOT DETECTED Final    Comment: (NOTE) The GeneXpert MRSA Assay (FDA approved for NASAL specimens only), is one component of a comprehensive MRSA colonization surveillance program. It is not intended to diagnose MRSA infection nor to guide or monitor treatment for MRSA infections. Test performance is not FDA approved in patients less than 72 years old. Performed at [facility], [address], [place], [place] [address]   Body fluid culture w Gram Stain     Status: None (Preliminary result)   Collection Time: 04/01/24  2:15 PM   Specimen: Pleural Fluid  Result Value Ref Range Status   Specimen Description   Final    PLEURAL Performed at [facility], [address], [place], [place] [address]    Special Requests   Final    LEFT LUNG Performed at [facility], [address] [place], [place] [address]    Gram Stain   Final    RARE WBC PRESENT, PREDOMINANTLY PMN NO ORGANISMS SEEN    Culture   Final    NO GROWTH 2 DAYS Performed at [facility], [address], [place], [place] [address]    Report Status PENDING  Incomplete         Radiology Studies: DG CHEST PORT 1 VIEW Result Date: 04/04/2024 CLINICAL DATA:  Pleural effusion. EXAM: PORTABLE CHEST 1 VIEW COMPARISON:  04/03/2024 FINDINGS: The cardiopericardial silhouette is within normal limits for size. The NG tube passes into the stomach although the distal tip position is not included on the film. Left chest tube evident  with left  base atelectasis/infiltrate and small left pleural effusion. No definite left-sided pneumothorax. Right lung clear. Lucency under the right hemidiaphragm is compatible with intraperitoneal free air not unexpected given the history of exploratory laparotomy yesterday. IMPRESSION: 1. Left base atelectasis/infiltrate with small left pleural effusion. No definite left-sided pneumothorax. 2. Intraperitoneal free air under the right hemidiaphragm, not unexpected given the history of exploratory laparotomy yesterday. Electronically Signed   By: Camellia Candle M.D.   On: 04/04/2024 08:41   DG Abd 1 View Result Date: 04/03/2024 EXAM: 1 VIEW XRAY OF THE ABDOMEN 04/03/2024 07:50:00 AM COMPARISON: 04/02/2024 CLINICAL HISTORY: SBO (small bowel obstruction) (HCC) FINDINGS: LINES, TUBES AND DEVICES: Enteric tube is similarly positioned with the tip in the gastric antrum. Partially visualized pigtail drainage catheter in lower left chest. BOWEL: Severe dilation of multiple segments of small bowel in the upper abdomen measuring up to 7.9 cm in diameter. There is enteric contrast present, but most of this contrast appears to be within the small bowel and has not reached the colon since yesterday. SOFT TISSUES: No opaque urinary calculi. BONES: No acute osseous abnormality. IMPRESSION: 1. Persistent severe dilation of multiple segments of small bowel in the mid to upper abdomen measuring up to 7.9 cm in diameter, with enteric contrast present, but not definitively reaching the colon. These findings remain especially worrisome for a closed-loop small bowel obstruction. 2. These results will be called to the ordering clinician or representative by the Radiologist Assistant and communication documented in the PACS or [employer]. Electronically signed by: Rogelia Myers MD 04/03/2024 08:27 AM EST RP Workstation: [id number]   DG CHEST PORT 1 VIEW Result Date: 04/03/2024 EXAM: 1 VIEW(S) XRAY OF THE CHEST 04/03/2024  05:33:00 AM COMPARISON: Portable chest dated 04/02/2024 07:52 AM. CLINICAL HISTORY: [id number] Pneumothorax [id number] Pneumothorax FINDINGS: LINES, TUBES AND DEVICES: Pigtail tube thoracostomy in the lower lateral left chest stable in position, but appears kinked in 2 places outside the chest. NGT is well inside the stomach, but the side hole and tip are out of view. LUNGS AND PLEURA: There is continued improvement and left basilar consolidation/atelectasis with haziness and mild interstitial coarsening remaining. The remaining lungs although hypoinflated appear clear. There is no measurable pneumothorax. There is no substantial pleural effusion. HEART AND MEDIASTINUM: The cardiomediastinal silhouette is normal. There is aortic atherosclerosis. BONES AND SOFT TISSUES: Thoracic spondylosis. Old right hemithyroidectomy. No acute osseous abnormality. IMPRESSION: 1. No measurable pneumothorax. 2. Pigtail tube thoracostomy in the lower lateral left chest is stable in position but appears kinked in two places outside the chest. 3. Continued improvement in left basilar consolidation/atelectasis with residual haziness and mild interstitial coarsening. Electronically signed by: Francis Quam MD 04/03/2024 05:55 AM EST RP Workstation: [id number]        Scheduled Meds:  amLODipine   10 mg Per Tube Daily   Chlorhexidine  Gluconate Cloth  6 each Topical Daily   vitamin B-12  500 mcg Oral Daily   enoxaparin  (LOVENOX ) injection  40 mg Subcutaneous Q24H   fluticasone   1 spray Each Nare Daily   fluticasone  furoate-vilanterol  1 puff Inhalation Daily   ipratropium  2 spray Each Nare TID   [START ON 04/08/2024] levothyroxine   37.5 mcg Intravenous Daily   loratadine   10 mg Per Tube Daily   memantine   10 mg Per Tube BID   montelukast   10 mg Oral QHS   pantoprazole  (PROTONIX ) IV  40 mg Intravenous Q12H   pravastatin   80 mg Per Tube Daily   sodium chloride  flush  10  mL Intrapleural Q8H   Continuous Infusions:  acetaminophen   Stopped (04/04/24 0651)   ceFEPime  (MAXIPIME ) IV Stopped (04/04/24 9478)          Sophie Mao, MD [facility] 04/04/2024, 11:44 AM

## 2024-04-05 ENCOUNTER — Inpatient Hospital Stay (HOSPITAL_COMMUNITY)

## 2024-04-05 DIAGNOSIS — J189 Pneumonia, unspecified organism: Secondary | ICD-10-CM | POA: Diagnosis not present

## 2024-04-05 DIAGNOSIS — J918 Pleural effusion in other conditions classified elsewhere: Secondary | ICD-10-CM | POA: Diagnosis not present

## 2024-04-05 LAB — CBC WITH DIFFERENTIAL/PLATELET
Abs Immature Granulocytes: 0.82 K/uL — ABNORMAL HIGH (ref 0.00–0.07)
Basophils Absolute: 0 K/uL (ref 0.0–0.1)
Basophils Relative: 0 %
Eosinophils Absolute: 0 K/uL (ref 0.0–0.5)
Eosinophils Relative: 0 %
HCT: 33.4 % — ABNORMAL LOW (ref 36.0–46.0)
Hemoglobin: 11.4 g/dL — ABNORMAL LOW (ref 12.0–15.0)
Immature Granulocytes: 3 %
Lymphocytes Relative: 4 %
Lymphs Abs: 1.1 K/uL (ref 0.7–4.0)
MCH: 30.9 pg (ref 26.0–34.0)
MCHC: 34.1 g/dL (ref 30.0–36.0)
MCV: 90.5 fL (ref 80.0–100.0)
Monocytes Absolute: 1.4 K/uL — ABNORMAL HIGH (ref 0.1–1.0)
Monocytes Relative: 5 %
Neutro Abs: 24 K/uL — ABNORMAL HIGH (ref 1.7–7.7)
Neutrophils Relative %: 88 %
Platelets: 371 K/uL (ref 150–400)
RBC: 3.69 MIL/uL — ABNORMAL LOW (ref 3.87–5.11)
RDW: 14.1 % (ref 11.5–15.5)
WBC: 27.3 K/uL — ABNORMAL HIGH (ref 4.0–10.5)
nRBC: 0.1 % (ref 0.0–0.2)

## 2024-04-05 LAB — BASIC METABOLIC PANEL WITH GFR
Anion gap: 11 (ref 5–15)
BUN: 48 mg/dL — ABNORMAL HIGH (ref 8–23)
CO2: 19 mmol/L — ABNORMAL LOW (ref 22–32)
Calcium: 9 mg/dL (ref 8.9–10.3)
Chloride: 113 mmol/L — ABNORMAL HIGH (ref 98–111)
Creatinine, Ser: 1.1 mg/dL — ABNORMAL HIGH (ref 0.44–1.00)
GFR, Estimated: 49 mL/min — ABNORMAL LOW (ref >60)
Glucose, Bld: 122 mg/dL — ABNORMAL HIGH (ref 70–99)
Potassium: 4.2 mmol/L (ref 3.5–5.1)
Sodium: 143 mmol/L (ref 135–145)

## 2024-04-05 LAB — MAGNESIUM: Magnesium: 2.5 mg/dL — ABNORMAL HIGH (ref 1.7–2.4)

## 2024-04-05 MED ORDER — CHLORHEXIDINE GLUCONATE CLOTH 2 % EX PADS
6.0000 | MEDICATED_PAD | Freq: Every day | CUTANEOUS | Status: DC
  Administered 2024-04-06 – 2024-04-22 (×17): 6 via TOPICAL

## 2024-04-05 NOTE — Progress Notes (Signed)
 PROGRESS NOTE    Angelica [name]  FMW:[id number] DOB: 06-15-1940 DOA: 03/29/2024 PCP: Norleen Lynwood ORN, MD   Brief Narrative:  83 y.o. female with PMH of GERD, HTN, HLD, IDA, asthma, stage 3b adenocarcinoma of the lung, hypothyroidism, hx of CVA, Alzheimer's, hx of SBO due to adhesions in 2016 and 2021 presented with complaints of breast being heavy or swollen.  On presentation, she had WBCs of 21.8, lactic acid of 2.1.  Chest x-ray showed near complete opacification of left lung.  CT head was negative for any acute finding.  CT of abdomen and pelvis showed large highly loculated left pleural effusion along with moderate stool burden and rectal prolapse.  Patient was started on IV antibiotics, pulmonary was consulted.  She underwent left-sided chest tube on 04/01/2024 by pulmonary.  Subsequently she developed SBO: General Surgery was consulted.  NG tube was placed and patient was made NPO.  She underwent pleural fibrinolysis x 2 by PCCM.  She subsequently underwent expiratory laparotomy with lysis of adhesions, small bowel resection and small bowel repair on 04/03/2024 by general surgery.  Assessment & Plan:   Small bowel obstruction in a patient with previous SBO x 2 -underwent expiratory laparotomy with lysis of adhesions, small bowel resection and small bowel repair on 04/03/2024 by general surgery. -Pain management/diet advancement/wound care as per general surgery.  Large highly loculated left pleural effusion Acute respiratory failure with hypoxia - Pulmonary was chest tube. underwent pleural fibrinolysis x 2 by PCCM.  Chest tube removed on 04/04/2024 by pulmonary. -Currently on broad-spectrum antibiotics as well. - Currently on 4 L oxygen via nasal cannula  History of adenocarcinoma of the lung, stage III status post chemoradiation followed by immunotherapy - Currently on observation.  Outpatient follow-up with oncology  Leukocytosis - Worsened today.  Monitor.  Thrombocytosis -  Resolved  Acute metabolic acidosis - Monitor  Elevated troponin: Possibly from demand ischemia -Troponins did not trend upwards.  No further workup needed.  Hypertension -Blood pressure intermittently on the lower side.  DC amlodipine   Hypothyroidism -Levothyroxine  has been switched to IV for now  History of stroke? Hyperlipidemia - Continue statin.  Has allergy to aspirin.  Plavix  on hold for now.  Outpatient follow-up with neurology  Asthma, severe persistent - No signs of exacerbation.  Continue bronchodilators  Alzheimer's disease - Continue Namenda .  Delirium precautions.  Fall precautions.  GERD - Continue IV Protonix   Goals of care - Palliative care evaluation pending.  Currently remains full code.    DVT prophylaxis: Lovenox  Code Status: Full Family Communication: Daughter at bedside Disposition Plan: Status is: Inpatient Remains inpatient appropriate because: Of severity of illness    Consultants: PCCM/general surgery.  palliative care  Procedures: As above  Antimicrobials:  Anti-infectives (From admission, onward)    Start     Dose/Rate Route Frequency Ordered Stop   04/03/24 1330  metroNIDAZOLE  (FLAGYL ) IVPB 500 mg        500 mg 100 mL/hr over 60 Minutes Intravenous On call to O.R. 04/03/24 1242 04/03/24 1507   03/30/24 2200  vancomycin  (VANCOREADY) IVPB 750 mg/150 mL  Status:  Discontinued        750 mg 150 mL/hr over 60 Minutes Intravenous Every 24 hours 03/29/24 1620 04/03/24 1310   03/30/24 0400  ceFEPIme  (MAXIPIME ) 2 g in sodium chloride  0.9 % 100 mL IVPB        2 g 200 mL/hr over 30 Minutes Intravenous Every 12 hours 03/29/24 1620     03/29/24 1530  vancomycin  (VANCOCIN ) IVPB 1000 mg/200 mL premix        1,000 mg 200 mL/hr over 60 Minutes Intravenous  Once 03/29/24 1517 03/30/24 0228   03/29/24 1530  ceFEPIme  (MAXIPIME ) 2 g in sodium chloride  0.9 % 100 mL IVPB        2 g 200 mL/hr over 30 Minutes Intravenous  Once 03/29/24 1518 03/29/24  1724        Subjective: Patient seen and examined at bedside.  Poor historian, drowsy.  No fever, vomiting or agitation reported. Objective: Vitals:   04/05/24 0500 04/05/24 0600 04/05/24 0700 04/05/24 0735  BP: (!) 105/58 117/73 (!) 98/52   Pulse: 82 76 77 77  Resp: 13 14 12 10   Temp:      TempSrc:      SpO2: 94% 92% 95% 91%  Weight:      Height:        Intake/Output Summary (Last 24 hours) at 04/05/2024 0751 Last data filed at 04/05/2024 0735 Gross per 24 hour  Intake 1485.64 ml  Output 815 ml  Net 670.64 ml   Filed Weights   03/29/24 2345 03/30/24 0626 04/03/24 1327  Weight: 59.6 kg 59.6 kg 59.6 kg    Examination:  General: On 4 L oxygen by nasal cannula.  No distress ENT/neck: No thyromegaly.  JVD is not elevated. NG tube present respiratory: Decreased breath sounds at bases bilaterally with some crackles; no wheezing  CVS: S1-S2 heard, rate controlled currently Abdominal: Soft, slightly tender, slightly distended; no organomegaly, abdominal dressing is present extremities: Trace lower extremity edema; no cyanosis  CNS: Drowsy, still very slow to respond and a poor historian.  No focal neurologic deficit.  Moves extremities Lymph: No obvious lymphadenopathy Skin: No obvious ecchymosis/lesions  psych: Extremely flat affect.  No signs of agitation. musculoskeletal: No obvious joint swelling/deformity    Data Reviewed: I have personally reviewed following labs and imaging studies  CBC: Recent Labs  Lab 04/01/24 0404 04/02/24 0412 04/03/24 0445 04/04/24 0237 04/05/24 0252  WBC 18.6* 16.2* 19.2* 17.4* 27.3*  NEUTROABS  --   --   --   --  24.0*  HGB 11.6* 13.5 15.0 13.7 11.4*  HCT 35.0* 41.3 45.0 41.3 33.4*  MCV 90.4 92.0 89.5 88.8 90.5  PLT 425* 461* 497* 454* 371   Basic Metabolic Panel: Recent Labs  Lab 03/29/24 1835 03/30/24 0426 04/01/24 0404 04/02/24 0412 04/03/24 0445 04/04/24 0237 04/05/24 0252  NA  --    < > 137 139 143 140 143  K  --     < > 3.2* 4.1 4.1 4.5 4.2  CL  --    < > 103 105 104 110 113*  CO2  --    < > 19* 17* 19* 17* 19*  GLUCOSE  --    < > 93 124* 142* 127* 122*  BUN  --    < > 17 24* 28* 40* 48*  CREATININE  --    < > 0.63 0.75 0.81 1.01* 1.10*  CALCIUM   --    < > 9.6 10.0 11.0* 9.4 9.0  MG 2.0  --  2.2  --   --   --  2.5*   < > = values in this interval not displayed.   GFR: Estimated Creatinine Clearance: 34.3 mL/min (A) (by C-G formula based on SCr of 1.1 mg/dL (H)). Liver Function Tests: Recent Labs  Lab 03/29/24 0926  AST 19  ALT 10  ALKPHOS 77  BILITOT 0.7  PROT 7.4  ALBUMIN  3.2*   No results for input(s): LIPASE, AMYLASE in the last 168 hours. No results for input(s): AMMONIA in the last 168 hours. Coagulation Profile: Recent Labs  Lab 03/29/24 1835  INR 1.2   Cardiac Enzymes: No results for input(s): CKTOTAL, CKMB, CKMBINDEX, TROPONINI in the last 168 hours. BNP (last 3 results) No results for input(s): PROBNP in the last 8760 hours. HbA1C: No results for input(s): HGBA1C in the last 72 hours. CBG: Recent Labs  Lab 03/29/24 0958  GLUCAP 110*   Lipid Profile: No results for input(s): CHOL, HDL, LDLCALC, TRIG, CHOLHDL, LDLDIRECT in the last 72 hours. Thyroid  Function Tests: No results for input(s): TSH, T4TOTAL, FREET4, T3FREE, THYROIDAB in the last 72 hours. Anemia Panel: No results for input(s): VITAMINB12, FOLATE, FERRITIN, TIBC, IRON , RETICCTPCT in the last 72 hours. Sepsis Labs: Recent Labs  Lab 03/29/24 1216 03/29/24 1835 03/30/24 0426  PROCALCITON  --  0.76 0.74  LATICACIDVEN 2.1*  --   --     Recent Results (from the past 240 hours)  Culture, blood (Routine X 2) w Reflex to ID Panel     Status: None   Collection Time: 03/29/24 11:47 AM   Specimen: BLOOD  Result Value Ref Range Status   Specimen Description   Final    BLOOD BLOOD LEFT ARM Performed at [facility], [address] [place], [place] [address]    Special Requests   Final    BOTTLES DRAWN AEROBIC AND ANAEROBIC Blood Culture results may not be optimal due to an inadequate volume of blood received in culture bottles Performed at [facility], [address] [place], [place] [address]    Culture   Final    NO GROWTH 5 DAYS Performed at [facility], [address] [place], [place] [address]    Report Status 04/03/2024 FINAL  Final  MRSA Next Gen by PCR, Nasal     Status: None   Collection Time: 03/30/24  1:01 PM   Specimen: Nasal Mucosa; Nasal Swab  Result Value Ref Range Status   MRSA by PCR Next Gen NOT DETECTED NOT DETECTED Final    Comment: (NOTE) The GeneXpert MRSA Assay (FDA approved for NASAL specimens only), is one component of a comprehensive MRSA colonization surveillance program. It is not intended to diagnose MRSA infection nor to guide or monitor treatment for MRSA infections. Test performance is not FDA approved in patients less than 39 years old. Performed at [facility], [address] [place], [place] [address]   Body fluid culture w Gram Stain     Status: None   Collection Time: 04/01/24  2:15 PM   Specimen: Pleural Fluid  Result Value Ref Range Status   Specimen Description   Final    PLEURAL Performed at [facility], [address] [place], [place] [address]    Special Requests   Final    LEFT LUNG Performed at [facility], [address], [place], [place] [address]    Gram Stain   Final    RARE WBC PRESENT, PREDOMINANTLY PMN NO ORGANISMS SEEN    Culture   Final    NO GROWTH 3 DAYS Performed at [facility], [address] [place], [place] [address]    Report Status 04/04/2024 FINAL  Final         Radiology Studies: DG CHEST PORT 1 VIEW Result Date: 04/05/2024 EXAM: 1 VIEW(S)  XRAY OF THE CHEST 04/05/2024 02:37:00 AM COMPARISON: None available. CLINICAL HISTORY:  Dyspnea. FINDINGS: LINES, TUBES AND DEVICES: Left basilar pigtail chest tube has been removed. Nasogastric tube extends into the upper abdomen beyond the margin of the examination. LUNGS AND PLEURA: Small left pleural effusion is present. Ovoid density within the left mid lung zone likely represents fluid within the fissure. Right lung is clear. No pneumothorax. HEART AND MEDIASTINUM: No acute abnormality of the cardiac and mediastinal silhouettes. BONES AND SOFT TISSUES: No acute osseous abnormality. IMPRESSION: 1. Small left pleural effusion. 2. Status post left basilar chest tube removal. No pneumothorax. Electronically signed by: Dorethia Molt MD 04/05/2024 02:56 AM EST RP Workstation: [id number]   DG CHEST PORT 1 VIEW Result Date: 04/04/2024 CLINICAL DATA:  Pleural effusion. EXAM: PORTABLE CHEST 1 VIEW COMPARISON:  04/03/2024 FINDINGS: The cardiopericardial silhouette is within normal limits for size. The NG tube passes into the stomach although the distal tip position is not included on the film. Left chest tube evident with left base atelectasis/infiltrate and small left pleural effusion. No definite left-sided pneumothorax. Right lung clear. Lucency under the right hemidiaphragm is compatible with intraperitoneal free air not unexpected given the history of exploratory laparotomy yesterday. IMPRESSION: 1. Left base atelectasis/infiltrate with small left pleural effusion. No definite left-sided pneumothorax. 2. Intraperitoneal free air under the right hemidiaphragm, not unexpected given the history of exploratory laparotomy yesterday. Electronically Signed   By: Camellia Candle M.D.   On: 04/04/2024 08:41        Scheduled Meds:  amLODipine   10 mg Per Tube Daily   budesonide  (PULMICORT ) nebulizer solution  0.5 mg Nebulization BID   Chlorhexidine  Gluconate Cloth  6 each Topical Daily   enoxaparin  (LOVENOX ) injection  40 mg Subcutaneous Q24H   fluticasone   1 spray Each Nare Daily   ipratropium  2  spray Each Nare TID   [START ON 04/08/2024] levothyroxine   37.5 mcg Intravenous Daily   loratadine   10 mg Per Tube Daily   memantine   10 mg Per Tube BID   montelukast   10 mg Per Tube QHS   pantoprazole  (PROTONIX ) IV  40 mg Intravenous Q12H   pravastatin   80 mg Per Tube Daily   sodium chloride  flush  10 mL Intrapleural Q8H   Continuous Infusions:  sodium chloride  100 mL/hr at 04/05/24 0735   ceFEPime  (MAXIPIME ) IV Stopped (04/05/24 0533)          Sophie Mao, MD [facility] 04/05/2024, 7:51 AM

## 2024-04-05 NOTE — Plan of Care (Signed)
  Problem: Clinical Measurements: Goal: Respiratory complications will improve Outcome: Not Progressing   Problem: Nutrition: Goal: Adequate nutrition will be maintained Outcome: Not Progressing   Problem: Elimination: Goal: Will not experience complications related to bowel motility Outcome: Not Progressing   Problem: Pain Managment: Goal: General experience of comfort will improve and/or be controlled Outcome: Not Progressing

## 2024-04-05 NOTE — Progress Notes (Signed)
 2 Days Post-Op ex lap and SBR Subjective: Sleeping comfortably in bed, easily arousable  Objective: Vital signs in last 24 hours: Temp:  [97.6 F (36.4 C)-98.8 F (37.1 C)] 97.9 F (36.6 C) (11/15 1600) Pulse Rate:  [76-94] 77 (11/16 0735) Resp:  [10-26] 10 (11/16 0735) BP: (87-117)/(49-73) 98/52 (11/16 0700) SpO2:  [86 %-100 %] 91 % (11/16 0735)   Intake/Output from previous day: 11/15 0701 - 11/16 0700 In: 1393 [I.V.:1030.9; IV Piggyback:362.2] Out: 1065 [Urine:615; Emesis/NG output:450] Intake/Output this shift: Total I/O In: 192.6 [I.V.:182.6; IV Piggyback:10] Out: 0    General appearance: alert and cooperative GI: soft  Incision: dressing clean, dry, intact  Lab Results:  Recent Labs    04/04/24 0237 04/05/24 0252  WBC 17.4* 27.3*  HGB 13.7 11.4*  HCT 41.3 33.4*  PLT 454* 371   BMET Recent Labs    04/04/24 0237 04/05/24 0252  NA 140 143  K 4.5 4.2  CL 110 113*  CO2 17* 19*  GLUCOSE 127* 122*  BUN 40* 48*  CREATININE 1.01* 1.10*  CALCIUM  9.4 9.0   PT/INR No results for input(s): LABPROT, INR in the last 72 hours. ABG Recent Labs    04/04/24 2128  PHART 7.39  HCO3 20.1    MEDS, Scheduled  amLODipine   10 mg Per Tube Daily   budesonide  (PULMICORT ) nebulizer solution  0.5 mg Nebulization BID   Chlorhexidine  Gluconate Cloth  6 each Topical Daily   enoxaparin  (LOVENOX ) injection  40 mg Subcutaneous Q24H   fluticasone   1 spray Each Nare Daily   ipratropium  2 spray Each Nare TID   [START ON 04/08/2024] levothyroxine   37.5 mcg Intravenous Daily   loratadine   10 mg Per Tube Daily   memantine   10 mg Per Tube BID   montelukast   10 mg Per Tube QHS   pantoprazole  (PROTONIX ) IV  40 mg Intravenous Q12H   pravastatin   80 mg Per Tube Daily   sodium chloride  flush  10 mL Intrapleural Q8H    Studies/Results: DG CHEST PORT 1 VIEW Result Date: 04/05/2024 EXAM: 1 VIEW(S) XRAY OF THE CHEST 04/05/2024 02:37:00 AM COMPARISON: None available. CLINICAL  HISTORY: Dyspnea. FINDINGS: LINES, TUBES AND DEVICES: Left basilar pigtail chest tube has been removed. Nasogastric tube extends into the upper abdomen beyond the margin of the examination. LUNGS AND PLEURA: Small left pleural effusion is present. Ovoid density within the left mid lung zone likely represents fluid within the fissure. Right lung is clear. No pneumothorax. HEART AND MEDIASTINUM: No acute abnormality of the cardiac and mediastinal silhouettes. BONES AND SOFT TISSUES: No acute osseous abnormality. IMPRESSION: 1. Small left pleural effusion. 2. Status post left basilar chest tube removal. No pneumothorax. Electronically signed by: Dorethia Molt MD 04/05/2024 02:56 AM EST RP Workstation: [id number]   DG CHEST PORT 1 VIEW Result Date: 04/04/2024 CLINICAL DATA:  Pleural effusion. EXAM: PORTABLE CHEST 1 VIEW COMPARISON:  04/03/2024 FINDINGS: The cardiopericardial silhouette is within normal limits for size. The NG tube passes into the stomach although the distal tip position is not included on the film. Left chest tube evident with left base atelectasis/infiltrate and small left pleural effusion. No definite left-sided pneumothorax. Right lung clear. Lucency under the right hemidiaphragm is compatible with intraperitoneal free air not unexpected given the history of exploratory laparotomy yesterday. IMPRESSION: 1. Left base atelectasis/infiltrate with small left pleural effusion. No definite left-sided pneumothorax. 2. Intraperitoneal free air under the right hemidiaphragm, not unexpected given the history of exploratory laparotomy yesterday. Electronically Signed  By: Camellia Candle M.D.   On: 04/04/2024 08:41   DG Abd 1 View Result Date: 04/03/2024 EXAM: 1 VIEW XRAY OF THE ABDOMEN 04/03/2024 07:50:00 AM COMPARISON: 04/02/2024 CLINICAL HISTORY: SBO (small bowel obstruction) (HCC) FINDINGS: LINES, TUBES AND DEVICES: Enteric tube is similarly positioned with the tip in the gastric antrum. Partially  visualized pigtail drainage catheter in lower left chest. BOWEL: Severe dilation of multiple segments of small bowel in the upper abdomen measuring up to 7.9 cm in diameter. There is enteric contrast present, but most of this contrast appears to be within the small bowel and has not reached the colon since yesterday. SOFT TISSUES: No opaque urinary calculi. BONES: No acute osseous abnormality. IMPRESSION: 1. Persistent severe dilation of multiple segments of small bowel in the mid to upper abdomen measuring up to 7.9 cm in diameter, with enteric contrast present, but not definitively reaching the colon. These findings remain especially worrisome for a closed-loop small bowel obstruction. 2. These results will be called to the ordering clinician or representative by the Radiologist Assistant and communication documented in the PACS or [employer]. Electronically signed by: Rogelia Myers MD 04/03/2024 08:27 AM EST RP Workstation: [id number]    Assessment: s/p Procedure(s): EXPLORATORY LAPAROTOMY, LYSIS OF ADHESIONS, SMALL BOWEL RESECTION, SMALL BOWEL REPAIR Patient Active Problem List   Diagnosis Date Noted   Pleural effusion associated with pulmonary infection vs. recurrent malignancy and sepsis 03/29/2024   Elevated troponin 03/29/2024   Sepsis (HCC) 03/29/2024   Low back pain 10/13/2023   Arthritis of right knee 06/20/2023   Corn of toe 06/20/2023   Mastodynia of left breast 02/13/2023   Pleural effusion 11/21/2022   Swelling of breast 11/17/2022   Dyspnea 11/14/2022   Iron  deficiency anemia 10/11/2022   Acute respiratory failure with hypoxia (HCC) 10/05/2022   Bilateral pleural effusion 10/05/2022   History of stroke 10/05/2022   Weakness 10/05/2022   Edema 10/05/2022   Alzheimer's disease (HCC) 09/06/2022   Ex-smoker 09/06/2022   Hypothyroidism 09/06/2022   Right leg pain 07/13/2021   Dementia (HCC) 07/13/2021   RLQ abdominal pain 07/13/2021   Anosmia 05/01/2021   Chronic  ethmoidal sinusitis 02/14/2021   Debility 02/01/2021   Gait disorder 02/01/2021   Port-A-Cath in place 11/02/2020   Adenocarcinoma of left lung, stage 3 (HCC) 06/22/2020   Lung mass    Pulmonary nodule 05/17/2020   Iron  deficiency 05/03/2020   Swelling of lower extremity 07/30/2019   Ganglion cyst of right foot 01/16/2019   HLD (hyperlipidemia) 01/16/2019   LPRD (laryngopharyngeal reflux disease) 07/25/2018   Constipation 08/19/2017   Asthma, severe persistent, well-controlled (HCC) 06/04/2017   Chronic pansinusitis 06/04/2017   Nasal polyposis 06/04/2017   Osteopenia 05/26/2017   GERD (gastroesophageal reflux disease)    H. pylori infection    Other allergic rhinitis 03/20/2016   Malnutrition of moderate degree 04/11/2015   HTN (hypertension) 04/10/2015    SBO s/p Exlap and SBR on 11/14 by Dr Teresa  Plan: Cont NPO/NG/IVFs, await return of bowel function VTE - Plavix  on hold, lovenox  SQ ID - Vanc, maxipime , expected inflammatory response   HTN HLD h/o CVA Alzheimer's disease (although oriented currently) Stage III adenocarcinoma of lung GERD Hypothyroidism  LOS: 7 days     .Bernarda JAYSON Ned, MD [facility], [place]    04/05/2024 7:41 AM

## 2024-04-05 NOTE — Progress Notes (Signed)
 Patients daughter called and requested for a Dr. to please give her a call for daily updates when she is not able to be at bedside with her mother. The daughter stated she will be here in the morning on 11/16 and will put her name and number on the board for staff.

## 2024-04-06 ENCOUNTER — Encounter (HOSPITAL_COMMUNITY): Payer: Self-pay | Admitting: Surgery

## 2024-04-06 DIAGNOSIS — Z7189 Other specified counseling: Secondary | ICD-10-CM | POA: Diagnosis not present

## 2024-04-06 DIAGNOSIS — Z515 Encounter for palliative care: Secondary | ICD-10-CM | POA: Diagnosis not present

## 2024-04-06 DIAGNOSIS — J918 Pleural effusion in other conditions classified elsewhere: Secondary | ICD-10-CM | POA: Diagnosis not present

## 2024-04-06 DIAGNOSIS — J189 Pneumonia, unspecified organism: Secondary | ICD-10-CM | POA: Diagnosis not present

## 2024-04-06 LAB — COMPREHENSIVE METABOLIC PANEL WITH GFR
ALT: 6 U/L (ref 0–44)
AST: 21 U/L (ref 15–41)
Albumin: 2.3 g/dL — ABNORMAL LOW (ref 3.5–5.0)
Alkaline Phosphatase: 86 U/L (ref 38–126)
Anion gap: 13 (ref 5–15)
BUN: 39 mg/dL — ABNORMAL HIGH (ref 8–23)
CO2: 18 mmol/L — ABNORMAL LOW (ref 22–32)
Calcium: 9.3 mg/dL (ref 8.9–10.3)
Chloride: 114 mmol/L — ABNORMAL HIGH (ref 98–111)
Creatinine, Ser: 0.79 mg/dL (ref 0.44–1.00)
GFR, Estimated: >60 mL/min (ref >60)
Glucose, Bld: 103 mg/dL — ABNORMAL HIGH (ref 70–99)
Potassium: 3.9 mmol/L (ref 3.5–5.1)
Sodium: 145 mmol/L (ref 135–145)
Total Bilirubin: 0.3 mg/dL (ref 0.0–1.2)
Total Protein: 6.3 g/dL — ABNORMAL LOW (ref 6.5–8.1)

## 2024-04-06 LAB — CBC WITH DIFFERENTIAL/PLATELET
Abs Immature Granulocytes: 2.38 K/uL — ABNORMAL HIGH (ref 0.00–0.07)
Basophils Absolute: 0 K/uL (ref 0.0–0.1)
Basophils Relative: 0 %
Eosinophils Absolute: 0 K/uL (ref 0.0–0.5)
Eosinophils Relative: 0 %
HCT: 37.8 % (ref 36.0–46.0)
Hemoglobin: 12.1 g/dL (ref 12.0–15.0)
Immature Granulocytes: 7 %
Lymphocytes Relative: 2 %
Lymphs Abs: 0.8 K/uL (ref 0.7–4.0)
MCH: 30.6 pg (ref 26.0–34.0)
MCHC: 32 g/dL (ref 30.0–36.0)
MCV: 95.7 fL (ref 80.0–100.0)
Monocytes Absolute: 0.9 K/uL (ref 0.1–1.0)
Monocytes Relative: 3 %
Neutro Abs: 28.6 K/uL — ABNORMAL HIGH (ref 1.7–7.7)
Neutrophils Relative %: 88 %
Platelets: 315 K/uL (ref 150–400)
RBC: 3.95 MIL/uL (ref 3.87–5.11)
RDW: 14.6 % (ref 11.5–15.5)
Smear Review: NORMAL
WBC: 32.6 K/uL — ABNORMAL HIGH (ref 4.0–10.5)
nRBC: 0.1 % (ref 0.0–0.2)

## 2024-04-06 LAB — GLUCOSE, CAPILLARY
Glucose-Capillary: 100 mg/dL — ABNORMAL HIGH (ref 70–99)
Glucose-Capillary: 108 mg/dL — ABNORMAL HIGH (ref 70–99)
Glucose-Capillary: 114 mg/dL — ABNORMAL HIGH (ref 70–99)
Glucose-Capillary: 117 mg/dL — ABNORMAL HIGH (ref 70–99)
Glucose-Capillary: 140 mg/dL — ABNORMAL HIGH (ref 70–99)
Glucose-Capillary: 64 mg/dL — ABNORMAL LOW (ref 70–99)
Glucose-Capillary: 65 mg/dL — ABNORMAL LOW (ref 70–99)
Glucose-Capillary: 68 mg/dL — ABNORMAL LOW (ref 70–99)
Glucose-Capillary: 76 mg/dL (ref 70–99)
Glucose-Capillary: 88 mg/dL (ref 70–99)

## 2024-04-06 LAB — MAGNESIUM: Magnesium: 2.6 mg/dL — ABNORMAL HIGH (ref 1.7–2.4)

## 2024-04-06 MED ORDER — METRONIDAZOLE 500 MG/100ML IV SOLN
500.0000 mg | Freq: Two times a day (BID) | INTRAVENOUS | Status: DC
  Administered 2024-04-06 – 2024-04-14 (×18): 500 mg via INTRAVENOUS
  Filled 2024-04-06 (×18): qty 100

## 2024-04-06 MED ORDER — DEXTROSE 50 % IV SOLN
12.5000 g | INTRAVENOUS | Status: AC
  Administered 2024-04-06: 12.5 g via INTRAVENOUS
  Filled 2024-04-06: qty 50

## 2024-04-06 MED ORDER — DEXTROSE-SODIUM CHLORIDE 5-0.45 % IV SOLN: INTRAVENOUS | Status: AC

## 2024-04-06 NOTE — Progress Notes (Signed)
       Overnight   NAME: Angelica [name] MRN: [id number] DOB : August 16, 1940    Date of Service   04/06/2024   HPI/Events of Note    Notified by RN for concern over CBG status due to n.p.o.  83 year old female with PMH of GERD, HTN, HLD, IDA, asthma, stage IIIb adenocarcinoma of the lung, hypothyroidism, history of CVA, Alzheimer's, history of SBO.  Patient is n.p.o. with NGT Current plan listed under surgical note is n.p.o./NG/IVF, awaiting return of bowel function.  Plan CBG every 4 hours due to n.p.o. status   Interventions/ Plan   CBG every 4 hours Continue all Attending previous orders Continue all Surgical Physician and previous orders        Update 0032 hrs.  Midnight CBG check found to be 65. Treatment underway with hypoglycemia protocol.  Continue CBG every 4 hours Consider D5 containing IVF if recurrent treatment of CBG values is necessary.   Lynwood Kipper BSN MSNA MSN ACNPC-AG Acute Care Nurse Practitioner [facility]

## 2024-04-06 NOTE — Progress Notes (Signed)
 Palliative Medicine Inpatient Follow Up Note HPI: 83 y.o. 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  She presented to the ED on 03/29/2024 with weakness.  She is admitted with a large highly loculated left pleural effusion and small bowel obstruction.   Palliative Medicine has been consulted for goals of care discussions and complex medical decision making.  Today's Discussion 04/06/2024  *Please note that this is a verbal dictation therefore any spelling or grammatical errors are due to the Dragon Medical One system interpretation.  Chart reviewed inclusive of vital signs, progress notes, laboratory results, and diagnostic images.   Upon assessment, Dajsha is sleeping and minimally responsive when aroused though answers yes/no.   I met this afternoon with patients daughter, Larry. We spoke about Antavia's current condition. Larry shares with me the difficulty she and siblings had in deciding to move forward with surgical intervention on 11/14. She shares that she is very glad they decided to pursue surgery as her mother from her perspective is doing well. We reviewed the knowledge that her recovery will be very trial-some and hard we discussed the possible roads ahead. We reviewed that the surgery team plans to initiate parenteral nutritional support allowing for Zarea's bowels to awaken. Ideally as this happens she will be able to take in more oral nutrition.  I shared with Larry the concerns associated with recovery in patients like Nerea with advanced age and multiple chronic co-morbid conditions. We reviewed the potential for decline despite present efforts.   Patients daughter notes wanting to do everything at this time to allow her mother the opportunities to heal. She shares that she heard all of the negative prior to determining  the surgical path and thus far her mother has succumb all odds therefore she has hope and faith that she has made the right decisions. In addition she notes her mothers strong spirit and desire to live.   I was able to provide reflective listening as a form of emotional support for Taron.   Plan remains to allow time for outcomes.   Questions and concerns addressed/Palliative Support Provided.  Objective Assessment: Vital Signs Vitals:   04/06/24 1400 04/06/24 1500  BP: (!) 141/56 (!) 153/53  Pulse: 73 70  Resp: 15 11  Temp:    SpO2: 97% 97%    Intake/Output Summary (Last 24 hours) at 04/06/2024 1534 Last data filed at 04/06/2024 0600 Gross per 24 hour  Intake 1137.25 ml  Output 810 ml  Net 327.25 ml   Last Weight  Most recent update: 04/03/2024  1:29 PM    Weight  59.6 kg (131 lb 6.3 oz)            Gen:  Frail elderly AA F chronically ill appearing HEENT: NGT, Dry mucous membranes CV: Regular rate and rhythm  PULM: On 2L [place], breathing is even and nonlabored ABD:  Soft, dressing on abd EXT: (+) BLE edema Neuro: Somnolent  SUMMARY OF RECOMMENDATIONS   Full Code / Full Scope of Care  Provide all necessary measures to promote favorable outcome --> Have shared concerns related to this large of a surgery with patients daughter and the long road [place] has ahead of her  The PMT will remain peripheral given the desire for full aggressive interventions ______________________________________________________________________________________ Rosaline Becton [facility] Palliative Medicine Team Team Cell Phone: [phone] Please utilize  secure chat with additional questions, if there is no response within 30 minutes please call the above phone number  Time Spent: 54 Billing based on MDM: HIgh  Palliative Medicine Team providers are available by phone from 7am to 7pm daily and can be reached through the team cell phone.  Should this patient require assistance outside  of these hours, please call the patient's attending physician.

## 2024-04-06 NOTE — Plan of Care (Signed)
  Problem: Pain Managment: Goal: General experience of comfort will improve and/or be controlled Outcome: Progressing   Problem: Safety: Goal: Ability to remain free from injury will improve Outcome: Progressing   Problem: Skin Integrity: Goal: Risk for impaired skin integrity will decrease Outcome: Progressing

## 2024-04-06 NOTE — Progress Notes (Signed)
 PROGRESS NOTE    Angelica [name]  FMW:[id number] DOB: Mar 19, 1941 DOA: 03/29/2024 PCP: Norleen Lynwood ORN, MD   Brief Narrative:  83 y.o. female with PMH of GERD, HTN, HLD, IDA, asthma, stage 3b adenocarcinoma of the lung, hypothyroidism, hx of CVA, Alzheimer's, hx of SBO due to adhesions in 2016 and 2021 presented with complaints of breast being heavy or swollen.  On presentation, she had WBCs of 21.8, lactic acid of 2.1.  Chest x-ray showed near complete opacification of left lung.  CT head was negative for any acute finding.  CT of abdomen and pelvis showed large highly loculated left pleural effusion along with moderate stool burden and rectal prolapse.  Patient was started on IV antibiotics, pulmonary was consulted.  She underwent left-sided chest tube on 04/01/2024 by pulmonary.  Subsequently she developed SBO: General Surgery was consulted.  NG tube was placed and patient was made NPO.  She underwent pleural fibrinolysis x 2 by PCCM.  She subsequently underwent expiratory laparotomy with lysis of adhesions, small bowel resection and small bowel repair on 04/03/2024 by general surgery. Palliative care consulted for goals of care discussion.  Assessment & Plan:   Small bowel obstruction in a patient with previous SBO x 2 -underwent expiratory laparotomy with lysis of adhesions, small bowel resection and small bowel repair on 04/03/2024 by general surgery. -Pain management/diet advancement/wound care as per general surgery.  Large highly loculated left pleural effusion Acute respiratory failure with hypoxia - Pulmonary was chest tube. underwent pleural fibrinolysis x 2 by PCCM.  Chest tube removed on 04/04/2024 by pulmonary. -Currently on broad-spectrum antibiotics as well. - Currently on 2 L oxygen via nasal cannula  History of adenocarcinoma of the lung, stage III status post chemoradiation followed by immunotherapy - Currently on observation.  Outpatient follow-up with  oncology  Leukocytosis - Labs pending today.  Monitor.  Thrombocytosis - Resolved  Acute metabolic acidosis - Monitor  Elevated troponin: Possibly from demand ischemia -Troponins did not trend upwards.  No further workup needed.  Hypertension -Blood pressure intermittently on the lower side.  Off amlodipine   Hypothyroidism -Levothyroxine  has been switched to IV for now  History of stroke? Hyperlipidemia - Continue statin.  Has allergy to aspirin.  Plavix  on hold for now.  Outpatient follow-up with neurology  Asthma, severe persistent - No signs of exacerbation.  Continue bronchodilators  Alzheimer's disease - Continue Namenda .  Delirium precautions.  Fall precautions.  GERD - Continue IV Protonix   Goals of care - Palliative care following.  Currently remains full code.  Hypoglycemia - Possibly from poor oral intake.  Start D5 NS.    DVT prophylaxis: Lovenox  Code Status: Full Family Communication: Daughter at bedside on 04/05/2024.  None at bedside today. Disposition Plan: Status is: Inpatient Remains inpatient appropriate because: Of severity of illness    Consultants: PCCM/general surgery.  palliative care  Procedures: As above  Antimicrobials:  Anti-infectives (From admission, onward)    Start     Dose/Rate Route Frequency Ordered Stop   04/03/24 1330  metroNIDAZOLE  (FLAGYL ) IVPB 500 mg        500 mg 100 mL/hr over 60 Minutes Intravenous On call to O.R. 04/03/24 1242 04/03/24 1507   03/30/24 2200  vancomycin  (VANCOREADY) IVPB 750 mg/150 mL  Status:  Discontinued        750 mg 150 mL/hr over 60 Minutes Intravenous Every 24 hours 03/29/24 1620 04/03/24 1310   03/30/24 0400  ceFEPIme  (MAXIPIME ) 2 g in sodium chloride  0.9 % 100 mL  IVPB        2 g 200 mL/hr over 30 Minutes Intravenous Every 12 hours 03/29/24 1620     03/29/24 1530  vancomycin  (VANCOCIN ) IVPB 1000 mg/200 mL premix        1,000 mg 200 mL/hr over 60 Minutes Intravenous  Once 03/29/24  1517 03/30/24 0228   03/29/24 1530  ceFEPIme  (MAXIPIME ) 2 g in sodium chloride  0.9 % 100 mL IVPB        2 g 200 mL/hr over 30 Minutes Intravenous  Once 03/29/24 1518 03/29/24 1724        Subjective: Patient seen and examined at bedside.  Poor historian. No seizures, agitation, fever reported. Objective: Vitals:   04/06/24 0400 04/06/24 0500 04/06/24 0600 04/06/24 0631  BP: (!) 141/57 (!) 124/57 (!) 171/60   Pulse: 74 72 85 82  Resp: 14 10 (!) 23 16  Temp:      TempSrc:      SpO2: 94% 99% 97% 95%  Weight:      Height:        Intake/Output Summary (Last 24 hours) at 04/06/2024 0718 Last data filed at 04/06/2024 0600 Gross per 24 hour  Intake 1916.36 ml  Output 960 ml  Net 956.36 ml   Filed Weights   03/29/24 2345 03/30/24 0626 04/03/24 1327  Weight: 59.6 kg 59.6 kg 59.6 kg    Examination:  General: On 2 L oxygen by nasal cannula.  No acute distress ENT/neck: No JVD elevation or neck masses. NG tube is present respiratory: Bilateral decreased breath sounds at bases with scattered crackles  CVS: Rate mostly controlled; S1 S2 heard Abdominal: Soft, tender and distended; no organomegaly, abdominal dressing present  extremities: No clubbing; mild lower extremities edema present. CNS: Remains slow to respond and extremely poor historian. No focal deficits noted Lymph: No obvious palpable lymphadenopathy Skin: No obvious petechiae/lesions psych: flat affect; not agitated musculoskeletal: No obvious joint tenderness/erythema    Data Reviewed: I have personally reviewed following labs and imaging studies  CBC: Recent Labs  Lab 04/01/24 0404 04/02/24 0412 04/03/24 0445 04/04/24 0237 04/05/24 0252  WBC 18.6* 16.2* 19.2* 17.4* 27.3*  NEUTROABS  --   --   --   --  24.0*  HGB 11.6* 13.5 15.0 13.7 11.4*  HCT 35.0* 41.3 45.0 41.3 33.4*  MCV 90.4 92.0 89.5 88.8 90.5  PLT 425* 461* 497* 454* 371   Basic Metabolic Panel: Recent Labs  Lab 04/01/24 0404 04/02/24 0412  04/03/24 0445 04/04/24 0237 04/05/24 0252  NA 137 139 143 140 143  K 3.2* 4.1 4.1 4.5 4.2  CL 103 105 104 110 113*  CO2 19* 17* 19* 17* 19*  GLUCOSE 93 124* 142* 127* 122*  BUN 17 24* 28* 40* 48*  CREATININE 0.63 0.75 0.81 1.01* 1.10*  CALCIUM  9.6 10.0 11.0* 9.4 9.0  MG 2.2  --   --   --  2.5*   GFR: Estimated Creatinine Clearance: 34.3 mL/min (A) (by C-G formula based on SCr of 1.1 mg/dL (H)). Liver Function Tests: No results for input(s): AST, ALT, ALKPHOS, BILITOT, PROT, ALBUMIN  in the last 168 hours.  No results for input(s): LIPASE, AMYLASE in the last 168 hours. No results for input(s): AMMONIA in the last 168 hours. Coagulation Profile: No results for input(s): INR, PROTIME in the last 168 hours.  Cardiac Enzymes: No results for input(s): CKTOTAL, CKMB, CKMBINDEX, TROPONINI in the last 168 hours. BNP (last 3 results) No results for input(s): PROBNP in the last  8760 hours. HbA1C: No results for input(s): HGBA1C in the last 72 hours. CBG: Recent Labs  Lab 04/06/24 0010 04/06/24 0044 04/06/24 0339  GLUCAP 65* 88 76   Lipid Profile: No results for input(s): CHOL, HDL, LDLCALC, TRIG, CHOLHDL, LDLDIRECT in the last 72 hours. Thyroid  Function Tests: No results for input(s): TSH, T4TOTAL, FREET4, T3FREE, THYROIDAB in the last 72 hours. Anemia Panel: No results for input(s): VITAMINB12, FOLATE, FERRITIN, TIBC, IRON , RETICCTPCT in the last 72 hours. Sepsis Labs: No results for input(s): PROCALCITON, LATICACIDVEN in the last 168 hours.   Recent Results (from the past 240 hours)  Culture, blood (Routine X 2) w Reflex to ID Panel     Status: None   Collection Time: 03/29/24 11:47 AM   Specimen: BLOOD  Result Value Ref Range Status   Specimen Description   Final    BLOOD BLOOD LEFT ARM Performed at [facility], [address] [place], [place] [address]    Special Requests    Final    BOTTLES DRAWN AEROBIC AND ANAEROBIC Blood Culture results may not be optimal due to an inadequate volume of blood received in culture bottles Performed at [facility], [address], [place], [place] [address]    Culture   Final    NO GROWTH 5 DAYS Performed at [facility], [address] [place], [place] [address]    Report Status 04/03/2024 FINAL  Final  MRSA Next Gen by PCR, Nasal     Status: None   Collection Time: 03/30/24  1:01 PM   Specimen: Nasal Mucosa; Nasal Swab  Result Value Ref Range Status   MRSA by PCR Next Gen NOT DETECTED NOT DETECTED Final    Comment: (NOTE) The GeneXpert MRSA Assay (FDA approved for NASAL specimens only), is one component of a comprehensive MRSA colonization surveillance program. It is not intended to diagnose MRSA infection nor to guide or monitor treatment for MRSA infections. Test performance is not FDA approved in patients less than 92 years old. Performed at [facility], [address] [place], [place] [address]   Body fluid culture w Gram Stain     Status: None   Collection Time: 04/01/24  2:15 PM   Specimen: Pleural Fluid  Result Value Ref Range Status   Specimen Description   Final    PLEURAL Performed at [facility], [address] [place], [place] [address]    Special Requests   Final    LEFT LUNG Performed at [facility], [address], [place], [place] [address]    Gram Stain   Final    RARE WBC PRESENT, PREDOMINANTLY PMN NO ORGANISMS SEEN    Culture   Final    NO GROWTH 3 DAYS Performed at [facility], [address] [place], [place] [address]    Report Status 04/04/2024 FINAL  Final  Fungus Culture With Stain     Status: None (Preliminary result)   Collection Time: 04/01/24  2:15 PM   Specimen: Pleural Fluid  Result Value Ref Range Status   Fungus Stain Final report  Final    Comment: (NOTE) Performed At: [facility] [address] [place], [place] [id number] Jennette Shorter MD Ey:[id number]    Fungus (Mycology) Culture PENDING  Incomplete   Fungal Source PLEURAL  Final    Comment: Performed at [facility], [address], [place], [place] [address]  Fungus Culture Result  Status: None   Collection Time: 04/01/24  2:15 PM  Result Value Ref Range Status   Result 1 Comment  Final    Comment: (NOTE) KOH/Calcofluor preparation:  no fungus observed. Performed At: [facility] [address] [place], [place] [id number] Jennette Shorter MD Ey:[id number]          Radiology Studies: DG CHEST PORT 1 VIEW Result Date: 04/05/2024 EXAM: 1 VIEW(S) XRAY OF THE CHEST 04/05/2024 02:37:00 AM COMPARISON: None available. CLINICAL HISTORY: Dyspnea. FINDINGS: LINES, TUBES AND DEVICES: Left basilar pigtail chest tube has been removed. Nasogastric tube extends into the upper abdomen beyond the margin of the examination. LUNGS AND PLEURA: Small left pleural effusion is present. Ovoid density within the left mid lung zone likely represents fluid within the fissure. Right lung is clear. No pneumothorax. HEART AND MEDIASTINUM: No acute abnormality of the cardiac and mediastinal silhouettes. BONES AND SOFT TISSUES: No acute osseous abnormality. IMPRESSION: 1. Small left pleural effusion. 2. Status post left basilar chest tube removal. No pneumothorax. Electronically signed by: Dorethia Molt MD 04/05/2024 02:56 AM EST RP Workstation: [id number]   DG CHEST PORT 1 VIEW Result Date: 04/04/2024 CLINICAL DATA:  Pleural effusion. EXAM: PORTABLE CHEST 1 VIEW COMPARISON:  04/03/2024 FINDINGS: The cardiopericardial silhouette is within normal limits for size. The NG tube passes into the stomach although the distal tip position is not included on the film. Left chest tube evident with left base atelectasis/infiltrate and small left pleural effusion. No definite left-sided pneumothorax. Right  lung clear. Lucency under the right hemidiaphragm is compatible with intraperitoneal free air not unexpected given the history of exploratory laparotomy yesterday. IMPRESSION: 1. Left base atelectasis/infiltrate with small left pleural effusion. No definite left-sided pneumothorax. 2. Intraperitoneal free air under the right hemidiaphragm, not unexpected given the history of exploratory laparotomy yesterday. Electronically Signed   By: Camellia Candle M.D.   On: 04/04/2024 08:41        Scheduled Meds:  budesonide  (PULMICORT ) nebulizer solution  0.5 mg Nebulization BID   Chlorhexidine  Gluconate Cloth  6 each Topical Q0600   enoxaparin  (LOVENOX ) injection  40 mg Subcutaneous Q24H   fluticasone   1 spray Each Nare Daily   ipratropium  2 spray Each Nare TID   [START ON 04/08/2024] levothyroxine   37.5 mcg Intravenous Daily   loratadine   10 mg Per Tube Daily   memantine   10 mg Per Tube BID   montelukast   10 mg Per Tube QHS   pantoprazole  (PROTONIX ) IV  40 mg Intravenous Q12H   pravastatin   80 mg Per Tube Daily   Continuous Infusions:  sodium chloride  75 mL/hr at 04/06/24 0600   ceFEPime  (MAXIPIME ) IV Stopped (04/06/24 0405)          Sophie Mao, MD [facility] 04/06/2024, 7:18 AM

## 2024-04-06 NOTE — Progress Notes (Signed)
 Hypoglycemic Event  CBG: 68  Treatment: D50 25 mL (12.5 gm)  Symptoms: None  Follow-up CBG: Time:0836 CBG Result:64  Possible Reasons for Event: Other: NPO  Comments/MD notified: administered another 1/2 AMP and stared d5 fluids per MD orders      Sajjad Honea F Anselmo Reihl

## 2024-04-06 NOTE — Progress Notes (Signed)
 3 Days Post-Op  Subjective: Patient sleeping.  Didn't really arouse much for me this morning.  She did nod no to abdominal pain.  Otherwise she just slept.  Objective: Vital signs in last 24 hours: Temp:  [98.3 F (36.8 C)-99 F (37.2 C)] 98.3 F (36.8 C) (11/17 0800) Pulse Rate:  [71-92] 82 (11/17 0631) Resp:  [9-23] 16 (11/17 0631) BP: (97-171)/(47-64) 171/60 (11/17 0600) SpO2:  [94 %-99 %] 95 % (11/17 0631) Last BM Date : 04/02/24  Intake/Output from previous day: 11/16 0701 - 11/17 0700 In: 1916.4 [I.V.:1646.4; NG/GT:60; IV Piggyback:209.9] Out: 960 [Urine:550; Emesis/NG output:410] Intake/Output this shift: No intake/output data recorded.  PE: Abd: soft, minimally tender, incision c/d/I with staples and honeycomb dressing, ND, NGT with some bilious output  Lab Results:  Recent Labs    04/05/24 0252 04/06/24 0853  WBC 27.3* 32.6*  HGB 11.4* 12.1  HCT 33.4* 37.8  PLT 371 315   BMET Recent Labs    04/05/24 0252 04/06/24 0853  NA 143 145  K 4.2 3.9  CL 113* 114*  CO2 19* 18*  GLUCOSE 122* 103*  BUN 48* 39*  CREATININE 1.10* 0.79  CALCIUM  9.0 9.3   PT/INR No results for input(s): LABPROT, INR in the last 72 hours. CMP     Component Value Date/Time   NA 145 04/06/2024 0853   K 3.9 04/06/2024 0853   CL 114 (H) 04/06/2024 0853   CO2 18 (L) 04/06/2024 0853   GLUCOSE 103 (H) 04/06/2024 0853   BUN 39 (H) 04/06/2024 0853   CREATININE 0.79 04/06/2024 0853   CREATININE 0.96 12/27/2023 0757   CREATININE 0.86 11/02/2022 1604   CALCIUM  9.3 04/06/2024 0853   PROT 6.3 (L) 04/06/2024 0853   PROT 7.7 06/07/2017 0940   ALBUMIN  2.3 (L) 04/06/2024 0853   AST 21 04/06/2024 0853   AST 24 12/27/2023 0757   ALT 6 04/06/2024 0853   ALT 15 12/27/2023 0757   ALKPHOS 86 04/06/2024 0853   BILITOT 0.3 04/06/2024 0853   BILITOT 0.7 12/27/2023 0757   GFRNONAA >60 04/06/2024 0853   GFRNONAA 58 (L) 12/27/2023 0757   GFRAA >60 08/15/2017 0012   Lipase      Component Value Date/Time   LIPASE 30 10/09/2023 1042       Studies/Results: DG CHEST PORT 1 VIEW Result Date: 04/05/2024 EXAM: 1 VIEW(S) XRAY OF THE CHEST 04/05/2024 02:37:00 AM COMPARISON: None available. CLINICAL HISTORY: Dyspnea. FINDINGS: LINES, TUBES AND DEVICES: Left basilar pigtail chest tube has been removed. Nasogastric tube extends into the upper abdomen beyond the margin of the examination. LUNGS AND PLEURA: Small left pleural effusion is present. Ovoid density within the left mid lung zone likely represents fluid within the fissure. Right lung is clear. No pneumothorax. HEART AND MEDIASTINUM: No acute abnormality of the cardiac and mediastinal silhouettes. BONES AND SOFT TISSUES: No acute osseous abnormality. IMPRESSION: 1. Small left pleural effusion. 2. Status post left basilar chest tube removal. No pneumothorax. Electronically signed by: Dorethia Molt MD 04/05/2024 02:56 AM EST RP Workstation: [id number]    Anti-infectives: Anti-infectives (From admission, onward)    Start     Dose/Rate Route Frequency Ordered Stop   04/03/24 1330  metroNIDAZOLE  (FLAGYL ) IVPB 500 mg        500 mg 100 mL/hr over 60 Minutes Intravenous On call to O.R. 04/03/24 1242 04/03/24 1507   03/30/24 2200  vancomycin  (VANCOREADY) IVPB 750 mg/150 mL  Status:  Discontinued  750 mg 150 mL/hr over 60 Minutes Intravenous Every 24 hours 03/29/24 1620 04/03/24 1310   03/30/24 0400  ceFEPIme  (MAXIPIME ) 2 g in sodium chloride  0.9 % 100 mL IVPB        2 g 200 mL/hr over 30 Minutes Intravenous Every 12 hours 03/29/24 1620     03/29/24 1530  vancomycin  (VANCOCIN ) IVPB 1000 mg/200 mL premix        1,000 mg 200 mL/hr over 60 Minutes Intravenous  Once 03/29/24 1517 03/30/24 0228   03/29/24 1530  ceFEPIme  (MAXIPIME ) 2 g in sodium chloride  0.9 % 100 mL IVPB        2 g 200 mL/hr over 30 Minutes Intravenous  Once 03/29/24 1518 03/29/24 1724        Assessment/Plan POD 3, s/p ex lap with SBR, Dr. Teresa  11/14 -cont NGT and await bowel function -mobilize as able -will place picc and start TNA today given poor nutrition and the likelihood that this patient will struggle with enteral intake as well moving forward -WBC up to 32K today.  Would be very early for post op infection only being 3 days post op.  Could be inflammatory response currently.  Will monitor as patient did have contamination in the OR so is at risk for developing abscess.  Remains AF though. -d/w primary service as well as with palliative care.  No family at bedside during my assessment  FEN - NGT/NPO/start TNA today/IVFs per primary VTE - Lovenox  ID - Cefipime, add flagyl  for complete intra-abdominal coverage  HTN HLD h/o CVA Alzheimer's disease (although oriented currently) Stage III adenocarcinoma of lung GERD Hypothyroidism PCM - start TNA    LOS: 8 days    Burnard FORBES Banter , [facility] Surgery 04/06/2024, 12:03 PM Please see Amion for pager number during day hours 7:00am-4:30pm or 7:00am -11:30am on weekends

## 2024-04-07 ENCOUNTER — Other Ambulatory Visit: Payer: Self-pay

## 2024-04-07 DIAGNOSIS — J189 Pneumonia, unspecified organism: Secondary | ICD-10-CM | POA: Diagnosis not present

## 2024-04-07 DIAGNOSIS — R531 Weakness: Secondary | ICD-10-CM | POA: Diagnosis not present

## 2024-04-07 DIAGNOSIS — J918 Pleural effusion in other conditions classified elsewhere: Secondary | ICD-10-CM | POA: Diagnosis not present

## 2024-04-07 DIAGNOSIS — Z515 Encounter for palliative care: Secondary | ICD-10-CM | POA: Diagnosis not present

## 2024-04-07 DIAGNOSIS — R1084 Generalized abdominal pain: Secondary | ICD-10-CM | POA: Diagnosis not present

## 2024-04-07 DIAGNOSIS — Z7189 Other specified counseling: Secondary | ICD-10-CM | POA: Diagnosis not present

## 2024-04-07 LAB — CBC WITH DIFFERENTIAL/PLATELET
Abs Immature Granulocytes: 0.6 K/uL — ABNORMAL HIGH (ref 0.00–0.07)
Basophils Absolute: 0 K/uL (ref 0.0–0.1)
Basophils Relative: 0 %
Eosinophils Absolute: 0 K/uL (ref 0.0–0.5)
Eosinophils Relative: 0 %
HCT: 37.3 % (ref 36.0–46.0)
Hemoglobin: 12.1 g/dL (ref 12.0–15.0)
Lymphocytes Relative: 5 %
Lymphs Abs: 1.5 K/uL (ref 0.7–4.0)
MCH: 30 pg (ref 26.0–34.0)
MCHC: 32.4 g/dL (ref 30.0–36.0)
MCV: 92.3 fL (ref 80.0–100.0)
Metamyelocytes Relative: 1 %
Monocytes Absolute: 0.6 K/uL (ref 0.1–1.0)
Monocytes Relative: 2 %
Myelocytes: 1 %
Neutro Abs: 26.9 K/uL — ABNORMAL HIGH (ref 1.7–7.7)
Neutrophils Relative %: 91 %
Platelets: 347 K/uL (ref 150–400)
RBC: 4.04 MIL/uL (ref 3.87–5.11)
RDW: 14.6 % (ref 11.5–15.5)
Smear Review: NORMAL
WBC: 29.6 K/uL — ABNORMAL HIGH (ref 4.0–10.5)
nRBC: 0.1 % (ref 0.0–0.2)

## 2024-04-07 LAB — GLUCOSE, CAPILLARY
Glucose-Capillary: 104 mg/dL — ABNORMAL HIGH (ref 70–99)
Glucose-Capillary: 112 mg/dL — ABNORMAL HIGH (ref 70–99)
Glucose-Capillary: 151 mg/dL — ABNORMAL HIGH (ref 70–99)
Glucose-Capillary: 157 mg/dL — ABNORMAL HIGH (ref 70–99)
Glucose-Capillary: 66 mg/dL — ABNORMAL LOW (ref 70–99)
Glucose-Capillary: 94 mg/dL (ref 70–99)
Glucose-Capillary: 96 mg/dL (ref 70–99)
Glucose-Capillary: 97 mg/dL (ref 70–99)

## 2024-04-07 LAB — BASIC METABOLIC PANEL WITH GFR
Anion gap: 8 (ref 5–15)
BUN: 25 mg/dL — ABNORMAL HIGH (ref 8–23)
CO2: 23 mmol/L (ref 22–32)
Calcium: 9.3 mg/dL (ref 8.9–10.3)
Chloride: 111 mmol/L (ref 98–111)
Creatinine, Ser: 0.69 mg/dL (ref 0.44–1.00)
GFR, Estimated: >60 mL/min (ref >60)
Glucose, Bld: 144 mg/dL — ABNORMAL HIGH (ref 70–99)
Potassium: 3.4 mmol/L — ABNORMAL LOW (ref 3.5–5.1)
Sodium: 143 mmol/L (ref 135–145)

## 2024-04-07 LAB — HEMOGLOBIN A1C
Hgb A1c MFr Bld: 5.9 % — ABNORMAL HIGH (ref 4.8–5.6)
Mean Plasma Glucose: 122.63 mg/dL

## 2024-04-07 LAB — MAGNESIUM: Magnesium: 2.1 mg/dL (ref 1.7–2.4)

## 2024-04-07 MED ORDER — SODIUM CHLORIDE 0.9% FLUSH
10.0000 mL | Freq: Two times a day (BID) | INTRAVENOUS | Status: DC
  Administered 2024-04-07: 10 mL
  Administered 2024-04-07 – 2024-04-08 (×2): 40 mL
  Administered 2024-04-08: 20 mL
  Administered 2024-04-09 – 2024-04-21 (×12): 10 mL

## 2024-04-07 MED ORDER — DEXTROSE 50 % IV SOLN
12.5000 g | INTRAVENOUS | Status: AC
  Administered 2024-04-07: 12.5 g via INTRAVENOUS

## 2024-04-07 MED ORDER — POTASSIUM CHLORIDE 10 MEQ/100ML IV SOLN
10.0000 meq | INTRAVENOUS | Status: AC
  Administered 2024-04-07 (×3): 10 meq via INTRAVENOUS
  Filled 2024-04-07 (×3): qty 100

## 2024-04-07 MED ORDER — METHOCARBAMOL 1000 MG/10ML IJ SOLN
500.0000 mg | Freq: Three times a day (TID) | INTRAMUSCULAR | Status: DC
  Administered 2024-04-07 – 2024-04-19 (×37): 500 mg via INTRAVENOUS
  Filled 2024-04-07 (×37): qty 10

## 2024-04-07 MED ORDER — ACETAMINOPHEN 10 MG/ML IV SOLN
1000.0000 mg | Freq: Four times a day (QID) | INTRAVENOUS | Status: AC
  Administered 2024-04-07 – 2024-04-08 (×4): 1000 mg via INTRAVENOUS
  Filled 2024-04-07 (×4): qty 100

## 2024-04-07 MED ORDER — SODIUM CHLORIDE 0.9% FLUSH: 10.0000 mL | INTRAVENOUS | Status: DC | PRN

## 2024-04-07 MED ORDER — THIAMINE HCL 100 MG/ML IJ SOLN
100.0000 mg | Freq: Every day | INTRAMUSCULAR | Status: AC
Start: 2024-04-07 — End: 2024-04-12
  Administered 2024-04-07 – 2024-04-11 (×4): 100 mg via INTRAVENOUS
  Filled 2024-04-07 (×5): qty 2

## 2024-04-07 MED ORDER — DEXTROSE 50 % IV SOLN
INTRAVENOUS | Status: AC
  Filled 2024-04-07: qty 50

## 2024-04-07 MED ORDER — TRAVASOL 10 % IV SOLN
INTRAVENOUS | Status: AC
  Filled 2024-04-07: qty 480

## 2024-04-07 MED ORDER — ACETAMINOPHEN 160 MG/5ML PO SOLN: 500.0000 mg | Freq: Three times a day (TID) | ORAL | Status: DC

## 2024-04-07 MED ORDER — INSULIN ASPART 100 UNIT/ML IJ SOLN
0.0000 [IU] | Freq: Four times a day (QID) | INTRAMUSCULAR | Status: DC
  Administered 2024-04-08 (×2): 2 [IU] via SUBCUTANEOUS
  Filled 2024-04-07 (×2): qty 2

## 2024-04-07 MED ORDER — DEXTROSE-SODIUM CHLORIDE 5-0.45 % IV SOLN: INTRAVENOUS | Status: DC

## 2024-04-07 NOTE — Progress Notes (Addendum)
 PHARMACY - TOTAL PARENTERAL NUTRITION CONSULT NOTE   Indication: Prolonged ileus  Patient Measurements: Height: 5' 5 (165.1 cm) Weight: 58.8 kg (129 lb 10.1 oz) IBW/kg (Calculated) : 57 TPN AdjBW (KG): 59.6 Body mass index is 21.57 kg/m. Usual Weight:    Assessment: Presented with abdominal pain and weakness - 11/14: Ex lap with LOA, SBR and repair Prolonged post-op ileus.  Glucose / Insulin : No h/o DM Electrolytes: K 3.4 low (goal>=4 with ileus), Mg 2.1 (goal>=2 with ileus), other WNL Renal: Scr <1, good UOP Hepatic: WNL Intake / Output; MIVF:  No MIVF NG  LBM 04/02/24  GI Imaging: 11/11 CT: SBO 11/13: AbdXR: SBO 11/14: AbdXR: SBO  GI Surgeries / Procedures:  - 11/14: Ex lap with LOA, SBR and repair  Central access: PICC TPN start date:  04/07/24  Nutritional Goals: Goal TPN rate is  mL/hr (provides  g of protein and  kcals per day)  RD Assessment: TBD    Current Nutrition:  NPO  Plan:  Start TPN at 24mL/hr at 1800 (no lipids due to allergy contraindications)  - Peanut allergy: Use SMOFlipid with potential cross reactivity risk with soybean - Fish Allergy: (non-anaphylactic): Use Intralipid  - Shellfish Allergy (non-anaphylactic): If fish allergy confirmed or the patient avoids all seafood, Use Intralipid   - For the short-term (< 7-10 days), benefits/risks can be discussed with patient or intravenous fat emulsions can be held    Electrolytes in TPN: Na 76mEq/L, K 12mEq/L, Ca 9mEq/L, Mg 86mEq/L, and Phos 15mmol/L. Cl:Ac 1:1 Add standard MVI and trace elements to TPN Initiate Sensitive q6h SSI and adjust as needed  Monitor TPN labs on Mon/Thurs, PRN K runs 10meq x 3  Williom Cedar Karoline Marina, PharmD, BCPS Clinical Staff Pharmacist  Marina, Tavonna Worthington Stillinger 04/07/2024,10:18 AM

## 2024-04-07 NOTE — Progress Notes (Signed)
 Per Surgical PA, Burnard Banter do not give meds per NG tube for now. Medications ordered per tube documented not given.

## 2024-04-07 NOTE — Progress Notes (Signed)
 Initial Nutrition Assessment  DOCUMENTATION CODES:   Non-severe (moderate) malnutrition in context of chronic illness  INTERVENTION:  - TPN to start tonight at 42mL/hr.   - TPN management per pharmacy.   - Monitor magnesium , potassium, and phosphorus daily for at least 3 days, MD to replete as needed, as pt is at risk for refeeding syndrome given NPO x7 days. - 100mg  thiamine x5 days due to refeeding risk.  - Daily weights while on TPN.   NUTRITION DIAGNOSIS:   Moderate Malnutrition related to chronic illness as evidenced by mild fat depletion, moderate muscle depletion.  GOAL:   Patient will meet greater than or equal to 90% of their needs  MONITOR:   Diet advancement, Labs, Weight trends  REASON FOR ASSESSMENT:   Other (Comment) (New TPN)    ASSESSMENT:   83 y.o. 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 Admitted for PE and SBO.   Patient in bed at time of visit. Patient groaning and not responding to questions, noted to have dementia.   Niece at bedside. She feels the patient has lost weight recently but unsure of how much or a time frame.  Per EMR, weight appears stable over the past year.  She is also not sure how well the patient was eating PTA.  Patient has been NPO x7 days. Surgery planning to start TPN tonight. PICC getting ready to be placed at end of visit. Discussed plan with niece and detailed TPN, she endorsed understanding.   Discussed with pharmacist. Patient noted to have allergies to fish, shellfish protein-containing drug products, peanut-containing drug products, strawberry extract, and Ensure (nutritional supplements).  Per pharmacy, patient unable to have SMOF lipids due to fish and shellfish allergy but also would not want  to use intralipid  due to potential cross reactivity risk with soybean. Therefore, patient not abel to receive any lipids at this time per pharmacy. Plan to hold on providing these at this time. If patient requires TPN >7-10 days, will need to revisit this due to concern for EFAD.   Medications reviewed and include: Protonix , D5 @ 56mL/hr (provides 204 kcals over 24 hours)  Labs reviewed:  K+ 3.4 HA1C 5.8   NUTRITION - FOCUSED PHYSICAL EXAM:  Flowsheet Row Most Recent Value  Orbital Region Moderate depletion  Upper Arm Region Mild depletion  Thoracic and Lumbar Region Unable to assess  Buccal Region Mild depletion  Temple Region Moderate depletion  Clavicle Bone Region Moderate depletion  Clavicle and Acromion Bone Region Moderate depletion  Scapular Bone Region Unable to assess  Dorsal Hand Moderate depletion  Patellar Region No depletion  Anterior Thigh Region No depletion  Posterior Calf Region Mild depletion  Edema (RD Assessment) None  Hair Reviewed  Eyes Reviewed  Mouth Reviewed  Skin Reviewed  Nails Reviewed    Diet Order:   Diet Order             Diet NPO time specified Except for: Ice Chips  Diet effective now                   EDUCATION NEEDS:  Education needs have been addressed  Skin:  Skin Assessment: Skin Integrity Issues: Skin Integrity Issues:: Incisions Incisions: Surgical on abdomen  Last BM:  11/13  Height:  Ht  Readings from Last 1 Encounters:  04/03/24 5' 5 (1.651 m)   Weight:  Wt Readings from Last 1 Encounters:  04/07/24 58.8 kg   Ideal Body Weight:  56.82 kg  BMI:  Body mass index is 21.57 kg/m.  Estimated Nutritional Needs:  Kcal:  1450-1600 kcals Protein:  70-85 grams Fluid:  >/= 1.5L    Trude Ned RD, LDN Contact via Secure Chat.

## 2024-04-07 NOTE — Progress Notes (Signed)
 4 Days Post-Op  Subjective: Patient arousable, but mostly resting.  States she is just sore.  No bowel function that we know of.  Objective: Vital signs in last 24 hours: Temp:  [97.5 F (36.4 C)-98.8 F (37.1 C)] 97.7 F (36.5 C) (11/18 0800) Pulse Rate:  [63-82] 76 (11/18 0602) Resp:  [8-25] 25 (11/18 0602) BP: (103-169)/(47-72) 162/68 (11/18 0602) SpO2:  [94 %-100 %] 96 % (11/18 0831) Weight:  [58.8 kg] 58.8 kg (11/18 0400) Last BM Date : 04/02/24  Intake/Output from previous day: 11/17 0701 - 11/18 0700 In: 1938 [I.V.:1429.8; NG/GT:110; IV Piggyback:398.2] Out: 1620 [Urine:1320; Emesis/NG output:300] Intake/Output this shift: No intake/output data recorded.  PE: Abd: soft, minimally tender, incision c/d/I with staples and honeycomb dressing, ND, NGT with some bilious output, 400cc documented in 24 hrs  Lab Results:  Recent Labs    04/06/24 0853 04/07/24 0200  WBC 32.6* 29.6*  HGB 12.1 12.1  HCT 37.8 37.3  PLT 315 347   BMET Recent Labs    04/06/24 0853 04/07/24 0200  NA 145 143  K 3.9 3.4*  CL 114* 111  CO2 18* 23  GLUCOSE 103* 144*  BUN 39* 25*  CREATININE 0.79 0.69  CALCIUM  9.3 9.3   PT/INR No results for input(s): LABPROT, INR in the last 72 hours. CMP     Component Value Date/Time   NA 143 04/07/2024 0200   K 3.4 (L) 04/07/2024 0200   CL 111 04/07/2024 0200   CO2 23 04/07/2024 0200   GLUCOSE 144 (H) 04/07/2024 0200   BUN 25 (H) 04/07/2024 0200   CREATININE 0.69 04/07/2024 0200   CREATININE 0.96 12/27/2023 0757   CREATININE 0.86 11/02/2022 1604   CALCIUM  9.3 04/07/2024 0200   PROT 6.3 (L) 04/06/2024 0853   PROT 7.7 06/07/2017 0940   ALBUMIN  2.3 (L) 04/06/2024 0853   AST 21 04/06/2024 0853   AST 24 12/27/2023 0757   ALT 6 04/06/2024 0853   ALT 15 12/27/2023 0757   ALKPHOS 86 04/06/2024 0853   BILITOT 0.3 04/06/2024 0853   BILITOT 0.7 12/27/2023 0757   GFRNONAA >60 04/07/2024 0200   GFRNONAA 58 (L) 12/27/2023 0757   GFRAA  >60 08/15/2017 0012   Lipase     Component Value Date/Time   LIPASE 30 10/09/2023 1042       Studies/Results: US  EKG SITE RITE Result Date: 04/07/2024 If Site Rite image not attached, placement could not be confirmed due to current cardiac rhythm.   Anti-infectives: Anti-infectives (From admission, onward)    Start     Dose/Rate Route Frequency Ordered Stop   04/06/24 1300  metroNIDAZOLE  (FLAGYL ) IVPB 500 mg        500 mg 100 mL/hr over 60 Minutes Intravenous Every 12 hours 04/06/24 1217     04/03/24 1330  metroNIDAZOLE  (FLAGYL ) IVPB 500 mg        500 mg 100 mL/hr over 60 Minutes Intravenous On call to O.R. 04/03/24 1242 04/03/24 1507   03/30/24 2200  vancomycin  (VANCOREADY) IVPB 750 mg/150 mL  Status:  Discontinued        750 mg 150 mL/hr over 60 Minutes Intravenous Every 24 hours 03/29/24 1620 04/03/24 1310   03/30/24 0400  ceFEPIme  (MAXIPIME ) 2 g in sodium chloride  0.9 % 100 mL IVPB        2 g 200 mL/hr over 30 Minutes Intravenous Every 12 hours 03/29/24 1620     03/29/24 1530  vancomycin  (VANCOCIN ) IVPB 1000 mg/200 mL premix  1,000 mg 200 mL/hr over 60 Minutes Intravenous  Once 03/29/24 1517 03/30/24 0228   03/29/24 1530  ceFEPIme  (MAXIPIME ) 2 g in sodium chloride  0.9 % 100 mL IVPB        2 g 200 mL/hr over 30 Minutes Intravenous  Once 03/29/24 1518 03/29/24 1724        Assessment/Plan POD 4, s/p ex lap with SBR, Dr. Teresa 11/14 -cont NGT and await bowel function -mobilize as able, PT/OT eval -will place picc and start TNA today given poor nutrition and the likelihood that this patient will struggle with enteral intake as well moving forward -WBC down to 29K today.  Would be very early for post op infection only being 3 days post op.  Could be inflammatory response currently.  Will monitor as patient did have contamination in the OR so is at risk for developing abscess.  Remains AF though. -d/w palliative care and daughter, Larry, who is at the bedside  today  FEN - NGT/NPO/start TNA today/IVFs per primary VTE - Lovenox  ID - Cefipime, flagyl    HTN HLD h/o CVA Alzheimer's disease (although oriented currently) Stage III adenocarcinoma of lung GERD Hypothyroidism PCM - start TNA    LOS: 9 days    Burnard FORBES Banter , [facility] 04/07/2024, 9:34 AM Please see Amion for pager number during day hours 7:00am-4:30pm or 7:00am -11:30am on weekends

## 2024-04-07 NOTE — Progress Notes (Signed)
 PROGRESS NOTE    Angelica [name]  FMW:[id number] DOB: 12/02/1940 DOA: 03/29/2024 PCP: Norleen Lynwood ORN, MD   Brief Narrative:  83 y.o. female with PMH of GERD, HTN, HLD, IDA, asthma, stage 3b adenocarcinoma of the lung, hypothyroidism, hx of CVA, Alzheimer's, hx of SBO due to adhesions in 2016 and 2021 presented with complaints of breast being heavy or swollen.  On presentation, she had WBCs of 21.8, lactic acid of 2.1.  Chest x-ray showed near complete opacification of left lung.  CT head was negative for any acute finding.  CT of abdomen and pelvis showed large highly loculated left pleural effusion along with moderate stool burden and rectal prolapse.  Patient was started on IV antibiotics, pulmonary was consulted.  She underwent left-sided chest tube on 04/01/2024 by pulmonary.  Subsequently she developed SBO: General Surgery was consulted.  NG tube was placed and patient was made NPO.  She underwent pleural fibrinolysis x 2 by PCCM.  She subsequently underwent expiratory laparotomy with lysis of adhesions, small bowel resection and small bowel repair on 04/03/2024 by general surgery. Palliative care consulted for goals of care discussion.  Assessment & Plan:   Small bowel obstruction in a patient with previous SBO x 2 -underwent expiratory laparotomy with lysis of adhesions, small bowel resection and small bowel repair on 04/03/2024 by general surgery. -Pain management/diet advancement/wound care as per general surgery. -TPN has been ordered by general surgery  Large highly loculated left pleural effusion Acute respiratory failure with hypoxia - Pulmonary was managing chest tube. underwent pleural fibrinolysis x 2 by PCCM.  Chest tube removed on 04/04/2024 by pulmonary.  Pulmonary has already signed off. -Currently on broad-spectrum antibiotics as well. - Currently on 2 L oxygen via nasal cannula  History of adenocarcinoma of the lung, stage III status post chemoradiation followed by  immunotherapy - Currently on observation.  Outpatient follow-up with oncology  Leukocytosis - WBCs significantly elevated.  Monitor.  Blood cultures from 04/06/2024 negative so far.  Thrombocytosis - Resolved  Acute metabolic acidosis - Resolved  Hypokalemia -Mild.  Monitor  Elevated troponin: Possibly from demand ischemia -Troponins did not trend upwards.  No further workup needed.  Hypertension -Blood pressure intermittently on the lower side.  Off amlodipine   Hypothyroidism -Levothyroxine  has been switched to IV for now  History of stroke? Hyperlipidemia - Continue statin.  Has allergy to aspirin.  Plavix  on hold for now.  Outpatient follow-up with neurology  Asthma, severe persistent - No signs of exacerbation.  Continue bronchodilators  Alzheimer's disease - Continue Namenda .  Delirium precautions.  Fall precautions.  GERD - Continue IV Protonix   Goals of care - Palliative care following.  Currently remains full code.  Overall prognosis is guarded to poor  Hypoglycemia - Possibly from poor oral intake.  Continue D5 NS.  TPN has been ordered by general surgery   DVT prophylaxis: Lovenox  Code Status: Full Family Communication: Daughter at bedside on 04/05/2024.  None at bedside today. Disposition Plan: Status is: Inpatient Remains inpatient appropriate because: Of severity of illness    Consultants: PCCM/general surgery.  palliative care  Procedures: As above  Antimicrobials:  Anti-infectives (From admission, onward)    Start     Dose/Rate Route Frequency Ordered Stop   04/06/24 1300  metroNIDAZOLE  (FLAGYL ) IVPB 500 mg        500 mg 100 mL/hr over 60 Minutes Intravenous Every 12 hours 04/06/24 1217     04/03/24 1330  metroNIDAZOLE  (FLAGYL ) IVPB 500 mg  500 mg 100 mL/hr over 60 Minutes Intravenous On call to O.R. 04/03/24 1242 04/03/24 1507   03/30/24 2200  vancomycin  (VANCOREADY) IVPB 750 mg/150 mL  Status:  Discontinued        750 mg 150  mL/hr over 60 Minutes Intravenous Every 24 hours 03/29/24 1620 04/03/24 1310   03/30/24 0400  ceFEPIme  (MAXIPIME ) 2 g in sodium chloride  0.9 % 100 mL IVPB        2 g 200 mL/hr over 30 Minutes Intravenous Every 12 hours 03/29/24 1620     03/29/24 1530  vancomycin  (VANCOCIN ) IVPB 1000 mg/200 mL premix        1,000 mg 200 mL/hr over 60 Minutes Intravenous  Once 03/29/24 1517 03/30/24 0228   03/29/24 1530  ceFEPIme  (MAXIPIME ) 2 g in sodium chloride  0.9 % 100 mL IVPB        2 g 200 mL/hr over 30 Minutes Intravenous  Once 03/29/24 1518 03/29/24 1724        Subjective: Patient seen and examined at bedside.  Poor historian.  No fever, agitation, vomiting reported  objective: Vitals:   04/07/24 0300 04/07/24 0400 04/07/24 0500 04/07/24 0602  BP: (!) 116/47 (!) 135/57 (!) 129/52 (!) 162/68  Pulse: 68 68 72 76  Resp: 11 10 11  (!) 25  Temp:  97.7 F (36.5 C)    TempSrc:  Axillary    SpO2: 95% 97% 96% 97%  Weight:  58.8 kg    Height:        Intake/Output Summary (Last 24 hours) at 04/07/2024 0725 Last data filed at 04/07/2024 0700 Gross per 24 hour  Intake 1937.99 ml  Output 1620 ml  Net 317.99 ml   Filed Weights   03/30/24 0626 04/03/24 1327 04/07/24 0400  Weight: 59.6 kg 59.6 kg 58.8 kg    Examination:  General: No distress; remains on 2 L oxygen by nasal cannula.  Elderly female lying in bed.  Looks chronically ill and deconditioned.   ENT/neck: Has NGT.  No palpable thyromegaly noted  respiratory: Decreased breath sounds at bases bilaterally with intermittent tachypnea  CVS: Rate currently controlled; S1 and S2 are heard Abdominal: Soft, remains mildly tender and distended; no organomegaly, abdominal dressing is present  extremities: Trace lower extremity edema; no cyanosis  CNS: Drowsy; extremely slow to respond and very poor historian.  No obvious focal deficits noted Lymph: No lymphadenopathy palpable  skin: No obvious ecchymosis/rashes  psych: Extremely flat affect  with no signs of agitation currently musculoskeletal: No obvious joint deformity/tenderness    Data Reviewed: I have personally reviewed following labs and imaging studies  CBC: Recent Labs  Lab 04/03/24 0445 04/04/24 0237 04/05/24 0252 04/06/24 0853 04/07/24 0200  WBC 19.2* 17.4* 27.3* 32.6* 29.6*  NEUTROABS  --   --  24.0* 28.6* 26.9*  HGB 15.0 13.7 11.4* 12.1 12.1  HCT 45.0 41.3 33.4* 37.8 37.3  MCV 89.5 88.8 90.5 95.7 92.3  PLT 497* 454* 371 315 347   Basic Metabolic Panel: Recent Labs  Lab 04/01/24 0404 04/02/24 0412 04/03/24 0445 04/04/24 0237 04/05/24 0252 04/06/24 0853 04/07/24 0200  NA 137   < > 143 140 143 145 143  K 3.2*   < > 4.1 4.5 4.2 3.9 3.4*  CL 103   < > 104 110 113* 114* 111  CO2 19*   < > 19* 17* 19* 18* 23  GLUCOSE 93   < > 142* 127* 122* 103* 144*  BUN 17   < > 28*  40* 48* 39* 25*  CREATININE 0.63   < > 0.81 1.01* 1.10* 0.79 0.69  CALCIUM  9.6   < > 11.0* 9.4 9.0 9.3 9.3  MG 2.2  --   --   --  2.5* 2.6* 2.1   < > = values in this interval not displayed.   GFR: Estimated Creatinine Clearance: 47.1 mL/min (by C-G formula based on SCr of 0.69 mg/dL). Liver Function Tests: Recent Labs  Lab 04/06/24 0853  AST 21  ALT 6  ALKPHOS 86  BILITOT 0.3  PROT 6.3*  ALBUMIN  2.3*    No results for input(s): LIPASE, AMYLASE in the last 168 hours. No results for input(s): AMMONIA in the last 168 hours. Coagulation Profile: No results for input(s): INR, PROTIME in the last 168 hours.  Cardiac Enzymes: No results for input(s): CKTOTAL, CKMB, CKMBINDEX, TROPONINI in the last 168 hours. BNP (last 3 results) No results for input(s): PROBNP in the last 8760 hours. HbA1C: No results for input(s): HGBA1C in the last 72 hours. CBG: Recent Labs  Lab 04/06/24 1138 04/06/24 1555 04/06/24 2058 04/06/24 2324 04/07/24 0343  GLUCAP 117* 114* 140* 108* 112*   Lipid Profile: No results for input(s): CHOL, HDL, LDLCALC, TRIG,  CHOLHDL, LDLDIRECT in the last 72 hours. Thyroid  Function Tests: No results for input(s): TSH, T4TOTAL, FREET4, T3FREE, THYROIDAB in the last 72 hours. Anemia Panel: No results for input(s): VITAMINB12, FOLATE, FERRITIN, TIBC, IRON , RETICCTPCT in the last 72 hours. Sepsis Labs: No results for input(s): PROCALCITON, LATICACIDVEN in the last 168 hours.   Recent Results (from the past 240 hours)  Culture, blood (Routine X 2) w Reflex to ID Panel     Status: None   Collection Time: 03/29/24 11:47 AM   Specimen: BLOOD  Result Value Ref Range Status   Specimen Description   Final    BLOOD BLOOD LEFT ARM Performed at [facility], [address] [place], [place] [address]    Special Requests   Final    BOTTLES DRAWN AEROBIC AND ANAEROBIC Blood Culture results may not be optimal due to an inadequate volume of blood received in culture bottles Performed at [facility], [address] [place], [place] [address]    Culture   Final    NO GROWTH 5 DAYS Performed at [facility], [address] [place], [place] [address]    Report Status 04/03/2024 FINAL  Final  MRSA Next Gen by PCR, Nasal     Status: None   Collection Time: 03/30/24  1:01 PM   Specimen: Nasal Mucosa; Nasal Swab  Result Value Ref Range Status   MRSA by PCR Next Gen NOT DETECTED NOT DETECTED Final    Comment: (NOTE) The GeneXpert MRSA Assay (FDA approved for NASAL specimens only), is one component of a comprehensive MRSA colonization surveillance program. It is not intended to diagnose MRSA infection nor to guide or monitor treatment for MRSA infections. Test performance is not FDA approved in patients less than 54 years old. Performed at [facility], [address], [place], [place] [address]   Body fluid culture w Gram Stain     Status: None   Collection Time: 04/01/24  2:15 PM   Specimen: Pleural Fluid  Result Value Ref  Range Status   Specimen Description   Final    PLEURAL Performed at [facility], [address] [place], [place] [address]    Special Requests   Final  LEFT LUNG Performed at [facility], [address] [place], [place] [address]    Gram Stain   Final    RARE WBC PRESENT, PREDOMINANTLY PMN NO ORGANISMS SEEN    Culture   Final    NO GROWTH 3 DAYS Performed at [facility], [address], [place], [place] [address]    Report Status 04/04/2024 FINAL  Final  Fungus Culture With Stain     Status: None (Preliminary result)   Collection Time: 04/01/24  2:15 PM   Specimen: Pleural Fluid  Result Value Ref Range Status   Fungus Stain Final report  Final    Comment: (NOTE) Performed At: [facility] [address] [place], [place] [id number] Jennette Shorter MD Ey:[id number]    Fungus (Mycology) Culture PENDING  Incomplete   Fungal Source PLEURAL  Final    Comment: Performed at [facility], [address] [place], [place] [address]  Fungus Culture Result     Status: None   Collection Time: 04/01/24  2:15 PM  Result Value Ref Range Status   Result 1 Comment  Final    Comment: (NOTE) KOH/Calcofluor preparation:  no fungus observed. Performed At: [facility] [address] [place], [place] [id number] Jennette Shorter MD Ey:[id number]   Culture, blood (Routine X 2) w Reflex to ID Panel     Status: None (Preliminary result)   Collection Time: 04/06/24  8:53 AM   Specimen: BLOOD  Result Value Ref Range Status   Specimen Description   Final    BLOOD BLOOD RIGHT HAND Performed at [facility], [address], [place], [place] [address]    Special Requests   Final    BOTTLES DRAWN AEROBIC ONLY Blood Culture results may not be optimal due to an inadequate volume of blood received in culture bottles Performed at [facility], [address], [place], [place]  [address]    Culture   Final    NO GROWTH < 12 HOURS Performed at [facility], [address], [place], [place] [address]    Report Status PENDING  Incomplete  Culture, blood (Routine X 2) w Reflex to ID Panel     Status: None (Preliminary result)   Collection Time: 04/06/24  8:57 AM   Specimen: BLOOD  Result Value Ref Range Status   Specimen Description   Final    BLOOD BLOOD RIGHT HAND Performed at [facility], [address] [place], [place] [address]    Special Requests   Final    BOTTLES DRAWN AEROBIC ONLY Blood Culture results may not be optimal due to an inadequate volume of blood received in culture bottles Performed at [facility], [address], [place], [place] [address]    Culture   Final    NO GROWTH < 12 HOURS Performed at [facility], [address] [place], [place] [address]    Report Status PENDING  Incomplete         Radiology Studies: No results found.       Scheduled Meds:  budesonide  (PULMICORT ) nebulizer solution  0.5 mg Nebulization BID   Chlorhexidine  Gluconate Cloth  6 each Topical Q0600   enoxaparin  (LOVENOX ) injection  40 mg Subcutaneous Q24H   fluticasone   1 spray Each Nare Daily   ipratropium  2 spray Each Nare TID   [START ON 04/08/2024] levothyroxine   37.5 mcg Intravenous Daily   loratadine   10 mg  Per Tube Daily   memantine   10 mg Per Tube BID   montelukast   10 mg Per Tube QHS   pantoprazole  (PROTONIX ) IV  40 mg Intravenous Q12H   pravastatin   80 mg Per Tube Daily   Continuous Infusions:  ceFEPime  (MAXIPIME ) IV Stopped (04/07/24 0427)   dextrose  5 % and 0.45 % NaCl 75 mL/hr at 04/07/24 0700   metronidazole  Stopped (04/06/24 2226)          Sophie Mao, MD [facility] 04/07/2024, 7:25 AM

## 2024-04-07 NOTE — Progress Notes (Signed)
 Physical Therapy Treatment Patient Details Name: Angelica [name] MRN: [id number] DOB: [birth year] Today's Date: 04/07/2024   History of Present Illness Angelica [name] is a 83 y.o. female presented to ED with complaints of weakness; admitted 11/9 with large loculated left pleural effusion.03/31/2024 diagnosed with SBO, s/p ex lap  11/14,placed NGT 04/01/24 s/p pigtail chest tube placed. 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 Patient followed simple 1 step with increased time and inconsistent while assisting in AAROM to both LE's. Daughter present and instructed with Written  exercises provided. Patient too lethargic to attempt mobility to dangle EOB. Continue PT. Patient will benefit from continued inpatient follow up therapy, <3 hours/day    If plan is discharge home, recommend the following: A lot of help with bathing/dressing/bathroom;Assistance with cooking/housework;Assist for transportation;Help with stairs or ramp for entrance;Two people to help with walking and/or transfers   Can travel by private vehicle        Equipment Recommendations  None recommended by PT    Recommendations for Other Services       Precautions / Restrictions Precautions Precaution/Restrictions Comments: NG, midline incision Restrictions Weight Bearing Restrictions Per Provider Order: No     Mobility  Bed Mobility               General bed mobility comments: NT, patient lethargic    Transfers                        Ambulation/Gait                   Stairs             Wheelchair Mobility     Tilt Bed    Modified Rankin (Stroke Patients Only)       Balance       Sitting balance - Comments: NT                                    Communication  Communication Communication: Impaired Factors Affecting Communication:  (limited attempt verbaslization)  Cognition Arousal: Lethargic Behavior During Therapy: Flat affect   PT - Cognitive impairments: History of cognitive impairments                       PT - Cognition Comments: keeps eyses closed,   Following commands impaired: Follows one step commands inconsistently    Cueing    Exercises General Exercises - Lower Extremity Ankle Circles/Pumps: AAROM, Both, 10 reps Heel Slides: AAROM, Both, 10 reps Hip ABduction/ADduction: AAROM, Both, 10 reps Other Exercises Other Exercises: daughter present for instruction in Ankle pumps, heelslides , hip abd to perform x 10 resps.    General Comments        Pertinent Vitals/Pain Pain Assessment Pain Assessment: Faces Breathing: normal Negative Vocalization: occasional moan/groan, low speech, negative/disapproving quality Facial Expression: smiling or inexpressive Body Language: relaxed Consolability: no need to console PAINAD Score: 1 Pain Location: stated that she felt better-post muscle relaxer Pain Descriptors / Indicators: Grimacing Pain Intervention(s): Limited activity within patient's tolerance, Monitored during session, Premedicated before session    Home Living  Prior Function            PT Goals (current goals can now be found in the care plan section) Acute Rehab PT Goals Potential to Achieve Goals: Fair Progress towards PT goals: Not progressing toward goals - comment (significant change since since surgery)    Frequency    Min 2X/week      PT Plan      Co-evaluation PT/OT/SLP Co-Evaluation/Treatment: Yes Reason for Co-Treatment: Complexity of the patient's impairments (multi-system involvement);For patient/therapist safety;To address functional/ADL transfers PT goals addressed during session: Mobility/safety with mobility OT goals addressed during  session: ADL's and self-care      AM-PAC PT 6 Clicks Mobility   Outcome Measure  Help needed turning from your back to your side while in a flat bed without using bedrails?: Total Help needed moving from lying on your back to sitting on the side of a flat bed without using bedrails?: Total Help needed moving to and from a bed to a chair (including a wheelchair)?: Total Help needed standing up from a chair using your arms (e.g., wheelchair or bedside chair)?: Total Help needed to walk in hospital room?: Total Help needed climbing 3-5 steps with a railing? : Total 6 Click Score: 6    End of Session   Activity Tolerance: Patient limited by fatigue Patient left: in bed;with call bell/phone within reach;with bed alarm set;with family/visitor present Nurse Communication: Mobility status;Need for lift equipment PT Visit Diagnosis: Unsteadiness on feet (R26.81);Muscle weakness (generalized) (M62.81)     Time: 9060-9051 PT Time Calculation (min) (ACUTE ONLY): 9 min  Charges:      PT General Charges $$ ACUTE PT VISIT: 1 Visit                     Darice Potters PT Acute Rehabilitation Services Office [phone]    Potters Darice Norris 04/07/2024, 10:54 AM

## 2024-04-07 NOTE — Progress Notes (Signed)
 Peripherally Inserted Central Catheter Placement  The IV Nurse has discussed with the patient and/or persons authorized to consent for the patient, the purpose of this procedure and the potential benefits and risks involved with this procedure.  The benefits include less needle sticks, lab draws from the catheter, and the patient may be discharged home with the catheter. Risks include, but not limited to, infection, bleeding, blood clot (thrombus formation), and puncture of an artery; nerve damage and irregular heartbeat and possibility to perform a PICC exchange if needed/ordered by physician.  Alternatives to this procedure were also discussed.  Bard Power PICC patient education guide, fact sheet on infection prevention and patient information card has been provided to patient /or left at bedside.    PICC Placement Documentation  PICC Double Lumen 04/07/24 Right Basilic 34 cm 1 cm (Active)  Indication for Insertion or Continuance of Line Administration of hyperosmolar/irritating solutions (i.e. TPN, Vancomycin , etc.) 04/07/24 1206  Exposed Catheter (cm) 1 cm 04/07/24 1206  Site Assessment Clean, Dry, Intact 04/07/24 1206  Lumen #1 Status Flushed;Saline locked;Blood return noted 04/07/24 1206  Lumen #2 Status Flushed;Saline locked;Blood return noted 04/07/24 1206  Dressing Type Transparent;Securing device 04/07/24 1206  Dressing Status Antimicrobial disc/dressing in place;Clean, Dry, Intact 04/07/24 1206  Line Care Connections checked and tightened 04/07/24 1206  Line Adjustment (NICU/IV Team Only) No 04/07/24 1206  Dressing Intervention New dressing;Adhesive placed at insertion site (IV team only) 04/07/24 1206  Dressing Change Due 04/14/24 04/07/24 1206    Daughter signed consent at bedside    Renaee Neville Skillern 04/07/2024, 12:18 PM

## 2024-04-07 NOTE — Plan of Care (Signed)
  Problem: Clinical Measurements: Goal: Will remain free from infection Outcome: Not Progressing Goal: Diagnostic test results will improve Outcome: Not Progressing   Problem: Activity: Goal: Risk for activity intolerance will decrease Outcome: Not Progressing   Problem: Nutrition: Goal: Adequate nutrition will be maintained Outcome: Not Progressing   Problem: Elimination: Goal: Will not experience complications related to bowel motility Outcome: Not Progressing

## 2024-04-07 NOTE — Progress Notes (Signed)
 Occupational Therapy Treatment Patient Details Name: Angelica [name] MRN: [id number] DOB: 1940-08-06 Today's Date: 04/07/2024   History of present illness Angelica [name] is a 83 y.o. female presented to ED with complaints of weakness; admitted 11/9 with large loculated left pleural effusion.03/31/2024 diagnosed with SBO, s/p ex lap  11/14,placed NGT 04/01/24 s/p pigtail chest tube placed. 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 Patient with lethargy due to muscle relaxant administered but able to participate bed level but unsafe to progress EOB this session. Daughter present for education and training in UE/LE ROM and simple foam grasp ball use 2-3 x/ day with + teach back. Patient will benefit from continued inpatient follow up therapy, <3 hours/day. Patient requires continued Acute care hospital level OT services to progress safety and functional performance and allow for discharge.        If plan is discharge home, recommend the following:  Two people to help with walking and/or transfers;Two people to help with bathing/dressing/bathroom;Assistance with cooking/housework;Direct supervision/assist for medications management;Direct supervision/assist for financial management;Assist for transportation;Help with stairs or ramp for entrance;Supervision due to cognitive status   Equipment Recommendations  None recommended by OT       Precautions / Restrictions Precautions Precautions: Fall Recall of Precautions/Restrictions: Impaired Precaution/Restrictions Comments: NG, midline incision Restrictions Weight Bearing Restrictions Per Provider Order: No       Mobility Bed Mobility Overal bed mobility: Needs Assistance             General bed mobility comments: patient lethargic requiring total support for bed mobility     Transfers                   General transfer comment: unsafe to attempt     Balance Overall balance assessment:  (unable to test)                                         ADL either performed or assessed with clinical judgement   ADL Overall ADL's : Needs assistance/impaired Eating/Feeding: NPO Eating/Feeding Details (indicate cue type and reason): NG Grooming: Wash/dry hands;Wash/dry face;Maximal assistance;Bed level   Upper Body Bathing: Bed level;Total assistance   Lower Body Bathing: Bed level;+2 for physical assistance;Total assistance   Upper Body Dressing : Bed level;Total assistance   Lower Body Dressing: Bed level;Total assistance;+2 for physical assistance       Toileting- Clothing Manipulation and Hygiene: Bed level;Total assistance;+2 for physical assistance         General ADL Comments: worked with daughter for carryover ideas for light BADL's and ROM    Extremity/Trunk Assessment Upper Extremity Assessment Upper Extremity Assessment: Generalized weakness;Right hand dominant   Lower Extremity Assessment Lower Extremity Assessment: Defer to PT evaluation                 Communication Communication Communication: Impaired Factors Affecting Communication: Difficulty expressing self;Reduced clarity of speech   Cognition Arousal: Lethargic Behavior During Therapy: Flat affect Cognition: Cognition impaired   Orientation impairments: Situation, Time Awareness: Intellectual awareness impaired Memory impairment (select all impairments): Short-term memory Attention impairment (select first level of impairment): Focused attention (most likely due to muscle relaxant) Executive functioning impairment (select all impairments): Initiation, Organization, Sequencing, Reasoning, Problem solving  OT - Cognition Comments: lethargy, on muscle relaxants, putting forth effort to follow session                 Following commands:  Impaired Following commands impaired: Follows one step commands inconsistently      Cueing   Cueing Techniques: Verbal cues, Gestural cues, Tactile cues, Visual cues  Exercises Exercises: General Upper Extremity General Exercises - Upper Extremity Shoulder Flexion: AAROM, 10 reps, Both Shoulder Extension: AAROM, Both, 10 reps Shoulder ABduction: AAROM, 10 reps, Both Elbow Flexion: AROM, 10 reps, Both Elbow Extension: AROM, Both, 10 reps General Exercises - Lower Extremity Ankle Circles/Pumps: AAROM, Both, 10 reps Heel Slides: AAROM, Both, 10 reps Hip ABduction/ADduction: AAROM, Both, 10 reps Other Exercises Other Exercises: daughter present for instruction in UE shoulder/elbow/wrist, hand; Ankle pumps, heelslides , hip abd to perform x 10 resps. Written handouts provided       General Comments educated on positioning and need for HOB >30 degrees, no SOB on HF 2 ltrs via [place] 95% SpO2    Pertinent Vitals/ Pain       Pain Assessment Pain Assessment: Faces Faces Pain Scale: Hurts a little bit Breathing: normal Negative Vocalization: occasional moan/groan, low speech, negative/disapproving quality Facial Expression: smiling or inexpressive Body Language: relaxed Consolability: no need to console PAINAD Score: 1 Facial Expression: Tense Body Movements: Absence of movements Muscle Tension: Tense, rigid Compliance with ventilator (intubated pts.): N/A Vocalization (extubated pts.): Sighing, moaning CPOT Total: 3 Pain Location: stated that she felt better-post muscle relaxer Pain Descriptors / Indicators: Grimacing Pain Intervention(s): Limited activity within patient's tolerance, Monitored during session, Premedicated before session   Frequency  Min 2X/week        Progress Toward Goals  OT Goals(current goals can now be found in the care plan section)  Progress towards OT goals: Progressing toward goals  Acute Rehab OT Goals OT Goal Formulation: Patient unable to  participate in goal setting Time For Goal Achievement: 04/17/24 Potential to Achieve Goals: Fair ADL Goals Pt Will Perform Grooming: sitting;with min assist Pt Will Perform Upper Body Bathing: sitting;with min assist Pt Will Transfer to Toilet: with min assist;stand pivot transfer;bedside commode;with mod assist Pt Will Perform Toileting - Clothing Manipulation and hygiene: with mod assist;sit to/from stand;sitting/lateral leans  Plan      Co-evaluation      Reason for Co-Treatment: Complexity of the patient's impairments (multi-system involvement);For patient/therapist safety;To address functional/ADL transfers PT goals addressed during session: Mobility/safety with mobility OT goals addressed during session: ADL's and self-care      AM-PAC OT 6 Clicks Daily Activity     Outcome Measure   Help from another person eating meals?: Total Help from another person taking care of personal grooming?: A Lot Help from another person toileting, which includes using toliet, bedpan, or urinal?: Total Help from another person bathing (including washing, rinsing, drying)?: Total Help from another person to put on and taking off regular upper body clothing?: Total Help from another person to put on and taking off regular lower body clothing?: Total 6 Click Score: 7    End of Session    OT Visit Diagnosis: Pain;Muscle weakness (generalized) (M62.81);Other symptoms and signs involving cognitive function;Other abnormalities of gait and mobility (R26.89);Unsteadiness on feet (R26.81)   Activity Tolerance Patient limited by lethargy   Patient Left in bed;with call bell/phone within reach;with bed alarm set;with nursing/sitter in room   Nurse Communication Other (comment) (response to tx)        Time: [phone]  OT Time Calculation (min): 20 min  Charges: OT General Charges $OT Visit: 1 Visit  [name] Furia OT/L Acute Rehabilitation Department  [phone]  04/07/2024, 2:08 PM

## 2024-04-07 NOTE — Progress Notes (Signed)
 Palliative Medicine Inpatient Follow Up Note HPI: 83 y.o. 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  She presented to the ED on 03/29/2024 with weakness. She is admitted with a large highly loculated left pleural effusion and small bowel obstruction.   Palliative Medicine has been consulted for goals of care discussions and complex medical decision making.  Today's Discussion 04/07/2024  *Please note that this is a verbal dictation therefore any spelling or grammatical errors are due to the Dragon Medical One system interpretation.  Chart reviewed inclusive of vital signs, progress notes of Dr. Cheryle, Burnard Banter, PA, laboratory results BMP - Na 143, K 3.4, Chl 111, CO2 23, Glu 144, BUN 24, Cr 0.69, Ca 9.3, Anion Gap 8, Mag 2.1, GFR > 60; WBC 29.6 downtrending , and diagnostic images.   I met with Angelica [name] this morning in the company of her daughter, Angelica [name]. Angelica [name] is more awake this morning and able to respond to yes and no questions. She is not able to assert if she is in distress though does have some wincing noted on exam. Her abdomen is soft with a dressing in place.   Per discussion with Angelica [name] she would appreciate daily updates from the surgical service, this message was conveyed to the specialist via secure chat.  We discussed the importance of mobility and ROM exercises at the very least in bed. Plan for PT/OT to work with Angelica [name] today.   I spoke with patients RN, Brittany who shares concerns of patients discomfort overnight. She had both groaning and moaning. We discussed adding some tylenol  ATC to support additional symptom burden.   Plan remains to allow time for outcomes.   Questions and concerns addressed/Palliative Support Provided.   Objective Assessment: Vital Signs Vitals:   04/07/24 0500 04/07/24 0602  BP: (!) 129/52 (!)  162/68  Pulse: 72 76  Resp: 11 (!) 25  Temp:    SpO2: 96% 97%    Intake/Output Summary (Last 24 hours) at 04/07/2024 0815 Last data filed at 04/07/2024 0700 Gross per 24 hour  Intake 1937.99 ml  Output 1620 ml  Net 317.99 ml   Last Weight  Most recent update: 04/07/2024  4:03 AM    Weight  58.8 kg (129 lb 10.1 oz)            Gen:  Frail elderly AA F chronically ill appearing HEENT: NGT, Dry mucous membranes CV: Regular rate and rhythm  PULM: On 2L [place], breathing is even and nonlabored ABD:  Soft, dressing on abd EXT: (+) BLE edema Neuro: Somnolent  SUMMARY OF RECOMMENDATIONS   Full Code / Full Scope of Care  Provide all necessary measures to promote favorable outcome    Appreciate OT/PT working with Angelica [name]  Have added some low dose tylenol  to address symptom burden   The PMT will remain available as needed given the desire for full aggressive interventions ______________________________________________________________________________________ Angelica [name] Palliative Medicine Team Team Cell Phone: [phone] Please utilize secure chat with additional questions, if there is no response within 30 minutes please call the above phone number  Billing based on MDM: Moderate   Palliative Medicine Team providers are available by phone from 7am to 7pm daily and can be reached through the team cell phone.  Should this patient require assistance outside of these hours, please call the patient's attending physician.

## 2024-04-08 ENCOUNTER — Other Ambulatory Visit (HOSPITAL_COMMUNITY): Payer: Self-pay

## 2024-04-08 ENCOUNTER — Telehealth (HOSPITAL_COMMUNITY): Payer: Self-pay | Admitting: Pharmacy Technician

## 2024-04-08 DIAGNOSIS — J189 Pneumonia, unspecified organism: Secondary | ICD-10-CM | POA: Diagnosis not present

## 2024-04-08 DIAGNOSIS — Z515 Encounter for palliative care: Secondary | ICD-10-CM | POA: Diagnosis not present

## 2024-04-08 DIAGNOSIS — Z7189 Other specified counseling: Secondary | ICD-10-CM | POA: Diagnosis not present

## 2024-04-08 DIAGNOSIS — R7303 Prediabetes: Secondary | ICD-10-CM | POA: Insufficient documentation

## 2024-04-08 DIAGNOSIS — J918 Pleural effusion in other conditions classified elsewhere: Secondary | ICD-10-CM | POA: Diagnosis not present

## 2024-04-08 LAB — COMPREHENSIVE METABOLIC PANEL WITH GFR
ALT: 5 U/L (ref 0–44)
AST: 15 U/L (ref 15–41)
Albumin: 2.1 g/dL — ABNORMAL LOW (ref 3.5–5.0)
Alkaline Phosphatase: 59 U/L (ref 38–126)
Anion gap: 8 (ref 5–15)
BUN: 13 mg/dL (ref 8–23)
CO2: 25 mmol/L (ref 22–32)
Calcium: 8.4 mg/dL — ABNORMAL LOW (ref 8.9–10.3)
Chloride: 103 mmol/L (ref 98–111)
Creatinine, Ser: 0.5 mg/dL (ref 0.44–1.00)
GFR, Estimated: >60 mL/min (ref >60)
Glucose, Bld: 197 mg/dL — ABNORMAL HIGH (ref 70–99)
Potassium: 2.4 mmol/L — CL (ref 3.5–5.1)
Sodium: 135 mmol/L (ref 135–145)
Total Bilirubin: 0.2 mg/dL (ref 0.0–1.2)
Total Protein: 5.8 g/dL — ABNORMAL LOW (ref 6.5–8.1)

## 2024-04-08 LAB — CBC WITH DIFFERENTIAL/PLATELET
Abs Immature Granulocytes: 1.73 K/uL — ABNORMAL HIGH (ref 0.00–0.07)
Basophils Absolute: 0.2 K/uL — ABNORMAL HIGH (ref 0.0–0.1)
Basophils Relative: 1 %
Eosinophils Absolute: 0 K/uL (ref 0.0–0.5)
Eosinophils Relative: 0 %
HCT: 32.5 % — ABNORMAL LOW (ref 36.0–46.0)
Hemoglobin: 10.7 g/dL — ABNORMAL LOW (ref 12.0–15.0)
Immature Granulocytes: 9 %
Lymphocytes Relative: 5 %
Lymphs Abs: 1 K/uL (ref 0.7–4.0)
MCH: 30.3 pg (ref 26.0–34.0)
MCHC: 32.9 g/dL (ref 30.0–36.0)
MCV: 92.1 fL (ref 80.0–100.0)
Monocytes Absolute: 0.9 K/uL (ref 0.1–1.0)
Monocytes Relative: 5 %
Neutro Abs: 16 K/uL — ABNORMAL HIGH (ref 1.7–7.7)
Neutrophils Relative %: 80 %
Platelets: 274 K/uL (ref 150–400)
RBC: 3.53 MIL/uL — ABNORMAL LOW (ref 3.87–5.11)
RDW: 14.4 % (ref 11.5–15.5)
Smear Review: NORMAL
WBC: 19.8 K/uL — ABNORMAL HIGH (ref 4.0–10.5)
nRBC: 0.2 % (ref 0.0–0.2)

## 2024-04-08 LAB — GLUCOSE, CAPILLARY
Glucose-Capillary: 177 mg/dL — ABNORMAL HIGH (ref 70–99)
Glucose-Capillary: 191 mg/dL — ABNORMAL HIGH (ref 70–99)
Glucose-Capillary: 162 mg/dL — ABNORMAL HIGH (ref 70–99)
Glucose-Capillary: 175 mg/dL — ABNORMAL HIGH (ref 70–99)
Glucose-Capillary: 185 mg/dL — ABNORMAL HIGH (ref 70–99)
Glucose-Capillary: 133 mg/dL — ABNORMAL HIGH (ref 70–99)
Glucose-Capillary: 119 mg/dL — ABNORMAL HIGH (ref 70–99)

## 2024-04-08 LAB — POTASSIUM: Potassium: 3.7 mmol/L (ref 3.5–5.1)

## 2024-04-08 LAB — SURGICAL PATHOLOGY

## 2024-04-08 LAB — MAGNESIUM: Magnesium: 1.5 mg/dL — ABNORMAL LOW (ref 1.7–2.4)

## 2024-04-08 LAB — PHOSPHORUS: Phosphorus: 1.4 mg/dL — ABNORMAL LOW (ref 2.5–4.6)

## 2024-04-08 MED ORDER — MAGNESIUM SULFATE 4 GM/100ML IV SOLN
4.0000 g | INTRAVENOUS | Status: AC
  Administered 2024-04-08: 4 g via INTRAVENOUS
  Filled 2024-04-08: qty 100

## 2024-04-08 MED ORDER — POTASSIUM CHLORIDE 10 MEQ/100ML IV SOLN
10.0000 meq | INTRAVENOUS | Status: AC
  Administered 2024-04-08 (×4): 10 meq via INTRAVENOUS
  Filled 2024-04-08 (×4): qty 100

## 2024-04-08 MED ORDER — POTASSIUM CHLORIDE 10 MEQ/100ML IV SOLN: 10.0000 meq | INTRAVENOUS | Status: DC

## 2024-04-08 MED ORDER — SODIUM CHLORIDE 0.9 % IV SOLN: INTRAVENOUS | Status: AC

## 2024-04-08 MED ORDER — INSULIN ASPART 100 UNIT/ML IJ SOLN
0.0000 [IU] | INTRAMUSCULAR | Status: DC
  Administered 2024-04-08 (×2): 2 [IU] via SUBCUTANEOUS
  Administered 2024-04-09 (×4): 1 [IU] via SUBCUTANEOUS
  Filled 2024-04-08: qty 2
  Filled 2024-04-08 (×2): qty 1
  Filled 2024-04-08: qty 3
  Filled 2024-04-08: qty 1
  Filled 2024-04-08: qty 2

## 2024-04-08 MED ORDER — ACETAMINOPHEN 10 MG/ML IV SOLN
1000.0000 mg | Freq: Four times a day (QID) | INTRAVENOUS | Status: AC
  Administered 2024-04-08 – 2024-04-09 (×4): 1000 mg via INTRAVENOUS
  Filled 2024-04-08 (×4): qty 100

## 2024-04-08 MED ORDER — POTASSIUM PHOSPHATES 15 MMOLE/5ML IV SOLN
45.0000 mmol | Freq: Once | INTRAVENOUS | Status: AC
  Administered 2024-04-08: 45 mmol via INTRAVENOUS
  Filled 2024-04-08: qty 15

## 2024-04-08 MED ORDER — TRAVASOL 10 % IV SOLN
INTRAVENOUS | Status: AC
  Filled 2024-04-08: qty 381.6

## 2024-04-08 MED ORDER — ORAL CARE MOUTH RINSE: 15.0000 mL | OROMUCOSAL | Status: DC | PRN

## 2024-04-08 MED ORDER — POTASSIUM CHLORIDE 10 MEQ/50ML IV SOLN
10.0000 meq | INTRAVENOUS | Status: AC
  Administered 2024-04-08 – 2024-04-09 (×2): 10 meq via INTRAVENOUS
  Filled 2024-04-08 (×2): qty 50

## 2024-04-08 MED ORDER — INSULIN ASPART 100 UNIT/ML IJ SOLN: 0.0000 [IU] | INTRAMUSCULAR | Status: DC

## 2024-04-08 NOTE — Telephone Encounter (Signed)
 Patient Product/process Development Scientist completed.    The patient is insured through [facility]. Patient has Medicare and is not eligible for a copay card, but may be able to apply for patient assistance or Medicare RX Payment Plan (Patient Must reach out to their plan, if eligible for payment plan), if available.    Ran test claim for budesonide  0.5 mg/2 ml nebulizer and the current 30 day co-pay is $12.64.   This test claim was processed through [facility]- copay amounts may vary at other pharmacies due to pharmacy/plan contracts, or as the patient moves through the different stages of their insurance plan.     Reyes Sharps, CPHT Pharmacy Technician Patient Advocate Specialist Lead [facility] Patient Advocate Team Direct Number: [phone]  Fax: [phone]

## 2024-04-08 NOTE — Progress Notes (Signed)
 Progress Note   Patient: Angelica [name] FMW:[id number] DOB: Mar 12, 1941 DOA: 03/29/2024     10 DOS: the patient was seen and examined on 04/08/2024   Brief hospital course: 83yo with h/o GERD, HTN, HLD, IDA, asthma, stage 3b adenocarcinoma of the lung, hypothyroidism, CVA, Alzheimer's dementia, and hx of SBO due to adhesions in 2016 and 2021 who presented on 11/9 with weakness and breast swelling. She was noted to have leukocytosis with lactate of 2.1.  CXR with near-complete opacification of the left lung, differential includes large malignant pleural effusion, central hilar mass with obstruction, lobar atelectasis, and/or pneumonia.  CT A/P with large highly located left pleural effusion with compressive atelectasis throughout the left lung.  She was started on antibiotics, pulmonary consulted.  11/11 also found to have SBO, NG tube placed, surgery consulted.  Underwent lysis of adhesions with small bowel resection on 11/14.  On TPN.    Assessment & Plan Pleural effusion associated with pulmonary infection vs. recurrent malignancy and sepsis Presenting with weakness, cough  PCCM following-infection versus malignancy Blood culture no growth so far On room air Continue broad-spectrum antibiotics w/ vancomycin  and cefepime   Chest tube placed 11/12, removed 11/15 Fluid studies suggest possible infectious etiology due to elevated WBC count, elevated LDH, elevated protein fluid but culture negative to date Blood cultures negative SBO (small bowel obstruction) (HCC) H/o prior SBO x 2 Patient complained of stomach upset and vomited 11/11; x-ray concerning for SBO  Surgery consulted NG tube inserted Repeat CT abdomen 11/11 small bowel obstruction with proximal transition point just beyond the ligament of Treitz Underwent ex lap with lysis of adhesion and small bowel resection on 11/14 Started on TPN Weakness Likely secondary to above Fall precautions PT/OT recommending STR Adenocarcinoma of  left lung, stage 3 (HCC) Diagnosed in 05/2020 S/p concurrent chemoradiation with weekly carboplatin  for AUC of 2 and paclitaxel  45 mg/M2. S/P 5 X -2022 S/p Consolidation treatment with immunotherapy with Imfinzi  1500 mg IV q 4 wk x 3 cycles - discontinued 2/2 intolerance/sepsis Currently on observation Pleural fluid cytology was negative from 11/12 HTN (hypertension) Continue amlodipine   PRN metoprolol Elevated troponin Flat and mildly elevated troponin 27>28 No chest pain or palpitations, no changes on EKG  Related to demand ischemia in the setting of sepsis HLD (hyperlipidemia) Continue high intensity statin  History of stroke Allergy to ASA On plavix  and high intensity statin PTA Per neuro note October 30 2023- She had an MRI scan of the brain done on 07/18/2021 which I personally reviewed and shows only mild changes of small vessel disease and generalized atrophy. I do not see a definite right cerebellar stroke but the radiologist has called 1 which apparently was not seen on previous MRI from 06/14/2020. Patient has since been started on Plavix  by Dr. Norleen   Neuro Dr Rosemarie did not feel it was CVA Plavix  is currently on hold Hypothyroidism Normal TSH, mildly elevated free T4 Continue synthroid  50mcg daily  Asthma, severe persistent, well-controlled (HCC) No signs of exacerbation  Continue bronchodilators Alzheimer's disease (HCC) At baseline Continue namenda  Delirium precautions  GERD (gastroesophageal reflux disease) Continue protonix  daily  Malnutrition of moderate degree Nutrition Problem: Moderate Malnutrition Etiology: chronic illness Signs/Symptoms: mild fat depletion, moderate muscle depletion Interventions: Refer to RD note for recommendations, TPN Hypokalemia K+ 2.4 this AM Repleted Recheck in AM Prediabetes A1c 5.9 Will cover with sensitive-scale SSI      Consultants: Pulmonology Surgery Palliative PT OT Nutrition TOC team  Procedures: Chest tube  insertion 11/12 Ex lap, lysis of adhesions, small bowel resection 11/14  Antibiotics: Cefepime  11/9- Metronidazole  11/14- Vancomycin  11/9-14  30 Day Unplanned Readmission Risk Score    Flowsheet Row ED to Hosp-Admission (Current) from 03/29/2024 in [facility]-ICU/STEPDOWN  30 Day Unplanned Readmission Risk Score (%) 23.38 Filed at 04/08/2024 0400    This score is the patient's risk of an unplanned readmission within 30 days of being discharged (0 -100%). The score is based on dignosis, age, lab data, medications, orders, and past utilization.   Low:  0-14.9   Medium: 15-21.9   High: 22-29.9   Extreme: 30 and above           Subjective: Patient is oriented to person and knows she is in the hospital.  I spoke with her daughter.  She thinks she is progressing along as well as expected.  She is pleased she was up in the chair.   Objective: Vitals:   04/08/24 1500 04/08/24 1600  BP: (!) 131/55 (!) 138/50  Pulse: 62   Resp: 14 13  Temp:    SpO2: 98%     Intake/Output Summary (Last 24 hours) at 04/08/2024 1615 Last data filed at 04/08/2024 1500 Gross per 24 hour  Intake 4122.41 ml  Output 3650 ml  Net 472.41 ml   Filed Weights   03/30/24 0626 04/03/24 1327 04/07/24 0400  Weight: 59.6 kg 59.6 kg 58.8 kg    Exam:  General:  Appears calm and comfortable and is in NAD, frail Eyes:  normal lids, iris ENT:  grossly normal hearing, lips & tongue, mmm; NG tube in place Cardiovascular:  RRR. No LE edema.  Respiratory:   CTA bilaterally with no wheezes/rales/rhonchi.  Normal respiratory effort. Abdomen:  large surgical bandage in place Skin:  no rash or induration seen on limited exam Musculoskeletal:  grossly normal tone BUE/BLE, good ROM, no bony abnormality Psychiatric: blunted mood and affect, speech limited today, AOx1-2 Neurologic:  CN 2-12 grossly intact, moves all extremities in coordinated fashion  Data Reviewed: I have reviewed the  patient's lab results since admission.  Pertinent labs for today include:   K+ 2.4 Glucose 197 Phos 1.4 Mag++ 1.6 Albumin  2.1 WBC 19.8, down from 29.6 Hgb 10.7 A1c 5.9    Family Communication: None present; I spoke with her daughter by telephone  Mobility: PT/OT Consulted and are recommending - Skilled Nursing-Short Term Rehab (<3 Hours/Day)04/07/2024 1051    Code Status: Full Code    Disposition: Status is: Inpatient Remains inpatient appropriate because: ongoing management     Time spent: 50 minutes  Unresulted Labs (From admission, onward)     Start     Ordered   04/13/24 0500  Triglycerides  (TPN Lab Panel)  Every Monday (0500),   R     Question:  Specimen collection method  Answer:  Lab=Lab collect   04/07/24 1027   04/10/24 0500  Basic metabolic panel with GFR  Daily,   R     Question:  Specimen collection method  Answer:  Unit=Unit collect   04/08/24 0815   04/10/24 0500  Magnesium   Daily,   R     Question:  Specimen collection method  Answer:  Unit=Unit collect   04/08/24 0815   04/10/24 0500  Phosphorus  Daily,   R     Question:  Specimen collection method  Answer:  Unit=Unit collect   04/08/24 0815   04/09/24 0500  Comprehensive metabolic panel  (TPN Lab Panel)  Every Mon,Thu (  0500),   R     Question:  Specimen collection method  Answer:  Lab=Lab collect   04/07/24 1027   04/09/24 0500  Magnesium   (TPN Lab Panel)  Every Mon,Thu (0500),   R     Question:  Specimen collection method  Answer:  Lab=Lab collect   04/07/24 1027   04/09/24 0500  Phosphorus  (TPN Lab Panel)  Every Mon,Thu (0500),   R     Question:  Specimen collection method  Answer:  Lab=Lab collect   04/07/24 1027   04/08/24 2000  Potassium  Once-Timed,   TIMED       Question:  Specimen collection method  Answer:  Unit=Unit collect   04/08/24 1316             Author: Delon Herald, MD 04/08/2024 4:15 PM  For on call review [email or web address].

## 2024-04-08 NOTE — Assessment & Plan Note (Signed)
 K+ 2.4 this AM Repleted Recheck in AM

## 2024-04-08 NOTE — Progress Notes (Signed)
   Palliative Medicine Inpatient Follow Up Note HPI: 83 y.o. 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  She presented to the ED on 03/29/2024 with weakness. She is admitted with a large highly loculated left pleural effusion and small bowel obstruction.   Palliative Medicine has been consulted for goals of care discussions and complex medical decision making.  Today's Discussion 04/08/2024  *Please note that this is a verbal dictation therefore any spelling or grammatical errors are due to the Dragon [employer] One system interpretation.  Chart reviewed inclusive of vital signs, progress notes of Dr. Cheryle, Burnard Banter, PA, laboratory results BMP - Na 135, K 2.4, Chl 103, CO2 25, Glu 197, BUN 13, Cr 0.50, Ca 8.4, Anion Gap 8, Mag 1.5, GFR > 60; WBC 19.8 downtrending , and diagnostic images.   I met with Louann this afternoon at bedside. She is generally uncomfortable in appearance moaning and grabbing towards her abdomen. She has been gotten out of bed to the recliner chair with the hoyer lift.   I spoke with patients RN, Heron who states that she is hard to assess from a cognitive perspective as she at one moment will share that she is not in distress and at another moment appear very distressed. She plans to give the tylenol  IV.  No family present at bedside this afternoon.  Questions and concerns addressed/Palliative Support Provided.   Objective Assessment: Vital Signs Vitals:   04/08/24 1000 04/08/24 1230  BP: (!) 155/70   Pulse: 73   Resp: 19   Temp:  97.7 F (36.5 C)  SpO2: 98%     Intake/Output Summary (Last 24 hours) at 04/08/2024 1301 Last data filed at 04/08/2024 0930 Gross per 24 hour  Intake 2896.01 ml  Output 3650 ml  Net -753.99 ml   Last Weight  Most recent update: 04/07/2024  4:03 AM    Weight  58.8 kg (129 lb  10.1 oz)            Gen:  Frail elderly AA F chronically ill appearing HEENT: NGT, Dry mucous membranes CV: Regular rate and rhythm  PULM: On RA, breathing is even and nonlabored ABD:  Soft, dressing on abd EXT: (+) BLE edema Neuro: Somnolent  SUMMARY OF RECOMMENDATIONS   Full Code / Full Scope of Care  Provide all necessary measures to promote favorable outcome    Appreciate OT/PT working with Naomie  Continue tylenol  ATC to support abd pain  The PMT will remain available as needed given the desire for full aggressive interventions ______________________________________________________________________________________ Rosaline Becton  Palliative Medicine Team Team Cell Phone: [phone] Please utilize secure chat with additional questions, if there is no response within 30 minutes please call the above phone number  Billing based on MDM: Moderate   Palliative Medicine Team providers are available by phone from 7am to 7pm daily and can be reached through the team cell phone.  Should this patient require assistance outside of these hours, please call the patient's attending physician.

## 2024-04-08 NOTE — Assessment & Plan Note (Signed)
 A1c 5.9 Will cover with sensitive-scale SSI

## 2024-04-08 NOTE — Progress Notes (Addendum)
 PHARMACY - TOTAL PARENTERAL NUTRITION CONSULT NOTE   Indication: Prolonged ileus  Patient Measurements: Height: 5' 5 (165.1 cm) Weight: 58.8 kg (129 lb 10.1 oz) IBW/kg (Calculated) : 57 TPN AdjBW (KG): 59.6 Body mass index is 21.57 kg/m. Usual Weight:    Assessment: Presented with abdominal pain and weakness - 11/14: Ex lap with LOA, SBR and repair Prolonged post-op ileus.  Glucose / Insulin : No h/o DM. A1c 5.9% -BG goal <180. BG range: 104-197 on TPN. 4 units SSI/24 hrs Electrolytes: Na, CorrCa (9.9) WNL -K (2.4), Mg (1.5), Phos (1.4) all low -Goal electrolytes with ileus: K >/= 4, Mg >/=2 Renal: Scr <1, BUN WNL Hepatic: LFTs, Alk Phos, T.bili all WNL. Albumin  low Intake / Output; MIVF: mIVF: D5 1/2 NS @ 75 mL/hr -UOP: 2100 mL, NG output: 550 mL -I/O Net: +246 mL GI Imaging: 11/11 CT: SBO 11/13: AbdXR: SBO 11/14: AbdXR: SBO  GI Surgeries / Procedures:  - 11/14: Ex lap with LOA, SBR and repair  Central access: Double lumen PICC placed 11/18 TPN start date:  04/07/24  Nutritional Goals: Goal TPN rate is 65 mL/hr (provides 83 g of protein and 1446 kcals per day)  RD Assessment: Estimated Needs Total Energy Estimated Needs: 1450-1600 kcals Total Protein Estimated Needs: 70-85 grams Total Fluid Estimated Needs: >/= 1.5L  Current Nutrition:  NPO TPN  Lipid recommendations in TPN: - Peanut allergy: Use SMOFlipid with potential cross reactivity risk with soybean - Fish Allergy: (non-anaphylactic): Use Intralipid  - Shellfish Allergy (non-anaphylactic): If fish allergy confirmed or the patient avoids all seafood, Use Intralipid   - For the short-term (< 7-10 days), benefits/risks can be discussed with patient or intravenous fat emulsions can be held    Plan:  Now: Mg 4 g IV once K Phos  45 mmol IV once (will provide 66 mEq KCl) KCl 10 mEq IV x 4 runs Remove D5 from mIVF. Change to NS @ 75 mL/hr Increase frequency of sSSI from q6h to q4h  At 18:00: Slightly  reduce rate of TPN to 30 mL/hr Will hold off on advancing rate due to refeeding No lipids due to allergies Will provide 667 kcal, 38 g protein, 151 g dextrose  Electrolytes in TPN: Standard - no change. Supplementing outside of TPN Na 50 mEq/L, K 50 mEq/L, Ca 5 mEq/L, Mg 5 mEq/L, and Phos 15 mmol/L.  Cl:Ac 1:1 Add standard MVI and trace elements to TPN Thiamine 100 mg IV daily x6 doses.  Continue sensitive SSI q4h and adjust as needed  Management/adjustment of mIVF rate per MD Monitor TPN labs on Mon/Thurs. Check electrolytes daily for now. Recheck K tonight  Ronal CHRISTELLA Rav, PharmD 04/08/2024,7:24 AM

## 2024-04-08 NOTE — Assessment & Plan Note (Addendum)
 Nutrition Problem: Moderate Malnutrition Etiology: chronic illness Signs/Symptoms: mild fat depletion, moderate muscle depletion Interventions: Refer to RD note for recommendations, TPN

## 2024-04-08 NOTE — Progress Notes (Signed)
 Brief Pharmacy Note re: potassium  Briefly, 83 yo F on TPN due to prolonged ileus at high risk for refeeding syndrome.  TPN started yesterday.  Received K runs x4 + KPhos infusion earlier today.   K at 21:40 = 3.7 (goal >4); slightly below goal but improved from morning labs after ~124mEq repletion (2.4>>3.7) Estimated CrCl ~59ml/min  Per TPN protocol, will order IV KCL x2 runs. F/U daily labs.  Rosaline Millet, PharmD, BCPS 04/08/2024@11 :07 PM

## 2024-04-08 NOTE — Progress Notes (Addendum)
 5 Days Post-Op  Subjective: Awake and moaning some.  Just got some pain medication. States her abdominal pain is no worse than it has been.  Overall stable.  No bowel function  Objective: Vital signs in last 24 hours: Temp:  [97.1 F (36.2 C)-99 F (37.2 C)] 97.6 F (36.4 C) (11/19 0810) Pulse Rate:  [61-107] 63 (11/19 0700) Resp:  [10-27] 11 (11/19 0700) BP: (102-183)/(40-141) 140/59 (11/19 0700) SpO2:  [85 %-100 %] 100 % (11/19 0914) Last BM Date : 04/02/24  Intake/Output from previous day: 11/18 0701 - 11/19 0700 In: 2896 [I.V.:1685.3; IV Piggyback:1210.8] Out: 2650 [Urine:2100; Emesis/NG output:550] Intake/Output this shift: No intake/output data recorded.  PE: Abd: soft, appropriately tender, incision c/d/I with loose staples and some open areas, ND, NGT with some bilious output, 550cc documented in 24 hrs  Lab Results:  Recent Labs    04/07/24 0200 04/08/24 0425  WBC 29.6* 19.8*  HGB 12.1 10.7*  HCT 37.3 32.5*  PLT 347 274   BMET Recent Labs    04/07/24 0200 04/08/24 0425  NA 143 135  K 3.4* 2.4*  CL 111 103  CO2 23 25  GLUCOSE 144* 197*  BUN 25* 13  CREATININE 0.69 0.50  CALCIUM  9.3 8.4*   PT/INR No results for input(s): LABPROT, INR in the last 72 hours. CMP     Component Value Date/Time   NA 135 04/08/2024 0425   K 2.4 (LL) 04/08/2024 0425   CL 103 04/08/2024 0425   CO2 25 04/08/2024 0425   GLUCOSE 197 (H) 04/08/2024 0425   BUN 13 04/08/2024 0425   CREATININE 0.50 04/08/2024 0425   CREATININE 0.96 12/27/2023 0757   CREATININE 0.86 11/02/2022 1604   CALCIUM  8.4 (L) 04/08/2024 0425   PROT 5.8 (L) 04/08/2024 0425   PROT 7.7 06/07/2017 0940   ALBUMIN  2.1 (L) 04/08/2024 0425   AST 15 04/08/2024 0425   AST 24 12/27/2023 0757   ALT 5 04/08/2024 0425   ALT 15 12/27/2023 0757   ALKPHOS 59 04/08/2024 0425   BILITOT 0.2 04/08/2024 0425   BILITOT 0.7 12/27/2023 0757   GFRNONAA >60 04/08/2024 0425   GFRNONAA 58 (L) 12/27/2023 0757    GFRAA >60 08/15/2017 0012   Lipase     Component Value Date/Time   LIPASE 30 10/09/2023 1042       Studies/Results: US  EKG SITE RITE Result Date: 04/07/2024 If Site Rite image not attached, placement could not be confirmed due to current cardiac rhythm.   Anti-infectives: Anti-infectives (From admission, onward)    Start     Dose/Rate Route Frequency Ordered Stop   04/06/24 1300  metroNIDAZOLE  (FLAGYL ) IVPB 500 mg        500 mg 100 mL/hr over 60 Minutes Intravenous Every 12 hours 04/06/24 1217     04/03/24 1330  metroNIDAZOLE  (FLAGYL ) IVPB 500 mg        500 mg 100 mL/hr over 60 Minutes Intravenous On call to O.R. 04/03/24 1242 04/03/24 1507   03/30/24 2200  vancomycin  (VANCOREADY) IVPB 750 mg/150 mL  Status:  Discontinued        750 mg 150 mL/hr over 60 Minutes Intravenous Every 24 hours 03/29/24 1620 04/03/24 1310   03/30/24 0400  ceFEPIme  (MAXIPIME ) 2 g in sodium chloride  0.9 % 100 mL IVPB        2 g 200 mL/hr over 30 Minutes Intravenous Every 12 hours 03/29/24 1620     03/29/24 1530  vancomycin  (VANCOCIN ) IVPB 1000 mg/200  mL premix        1,000 mg 200 mL/hr over 60 Minutes Intravenous  Once 03/29/24 1517 03/30/24 0228   03/29/24 1530  ceFEPIme  (MAXIPIME ) 2 g in sodium chloride  0.9 % 100 mL IVPB        2 g 200 mL/hr over 30 Minutes Intravenous  Once 03/29/24 1518 03/29/24 1724        Assessment/Plan POD 5, s/p ex lap with SBR, Dr. Teresa 11/14 -cont NGT and await bowel function -mobilize as able, PT/OT eval -picc and TNA given poor nutrition and the likelihood that this patient will struggle with enteral intake as well moving forward -WBC down to 19K today.  Will monitor as patient did have contamination in the OR so is at risk for developing abscess.  Remains AF though. -K to be replaced by pharmacy in TNA -spoke to Taron, patient's daughter, to give update  FEN - NGT/NPO/TNA/IVFs per primary VTE - Lovenox  ID - Cefipime, flagyl    HTN HLD h/o  CVA Alzheimer's disease (although oriented currently) Stage III adenocarcinoma of lung GERD Hypothyroidism PCM - start TNA    LOS: 10 days    Burnard FORBES Banter , [facility] 04/08/2024, 9:26 AM Please see Amion for pager number during day hours 7:00am-4:30pm or 7:00am -11:30am on weekends

## 2024-04-08 NOTE — Plan of Care (Signed)
  Problem: Activity: Goal: Risk for activity intolerance will decrease Outcome: Not Progressing   Problem: Nutrition: Goal: Adequate nutrition will be maintained Outcome: Not Progressing   Problem: Coping: Goal: Level of anxiety will decrease Outcome: Not Progressing   Problem: Elimination: Goal: Will not experience complications related to bowel motility Outcome: Not Progressing   Problem: Pain Managment: Goal: General experience of comfort will improve and/or be controlled Outcome: Not Progressing   Problem: Nutritional: Goal: Maintenance of adequate nutrition will improve Outcome: Not Progressing Goal: Progress toward achieving an optimal weight will improve Outcome: Not Progressing

## 2024-04-08 NOTE — Assessment & Plan Note (Addendum)
 H/o prior SBO x 2 Patient complained of stomach upset and vomited 11/11; x-ray concerning for SBO  Surgery consulted NG tube inserted Repeat CT abdomen 11/11 small bowel obstruction with proximal transition point just beyond the ligament of Treitz Underwent ex lap with lysis of adhesion and small bowel resection on 11/14 Started on TPN

## 2024-04-08 NOTE — Plan of Care (Signed)
 Assessment completed, along with dressing change, abdomen slightly open with some staples, no redness noted, NG tube to LWIS bowel sounds present, TPN and NS infusing via double lumen PICC, foley catheter patent. Bed in lowest locked position with side rails up, bed alarm on, call bell in reach with mat on the floor, plan of care and goals reviewed.  Problem: Education: Goal: Knowledge of General Education information will improve Description: Including pain rating scale, medication(s)/side effects and non-pharmacologic comfort measures Outcome: Progressing   Problem: Clinical Measurements: Goal: Ability to maintain clinical measurements within normal limits will improve Outcome: Progressing Goal: Will remain free from infection Outcome: Progressing Goal: Diagnostic test results will improve Outcome: Progressing Goal: Respiratory complications will improve Outcome: Progressing Goal: Cardiovascular complication will be avoided Outcome: Progressing   Problem: Elimination: Goal: Will not experience complications related to bowel motility Outcome: Progressing Goal: Will not experience complications related to urinary retention Outcome: Progressing   Problem: Pain Managment: Goal: General experience of comfort will improve and/or be controlled Outcome: Progressing   Problem: Safety: Goal: Ability to remain free from injury will improve Outcome: Progressing

## 2024-04-09 ENCOUNTER — Encounter (HOSPITAL_COMMUNITY): Payer: Self-pay | Admitting: Family Medicine

## 2024-04-09 ENCOUNTER — Inpatient Hospital Stay (HOSPITAL_COMMUNITY)

## 2024-04-09 ENCOUNTER — Other Ambulatory Visit: Payer: Self-pay | Admitting: Internal Medicine

## 2024-04-09 DIAGNOSIS — J189 Pneumonia, unspecified organism: Secondary | ICD-10-CM | POA: Diagnosis not present

## 2024-04-09 DIAGNOSIS — J918 Pleural effusion in other conditions classified elsewhere: Secondary | ICD-10-CM | POA: Diagnosis not present

## 2024-04-09 LAB — COMPREHENSIVE METABOLIC PANEL WITH GFR
ALT: <5 U/L (ref 0–44)
AST: 17 U/L (ref 15–41)
Albumin: 2.1 g/dL — ABNORMAL LOW (ref 3.5–5.0)
Alkaline Phosphatase: 58 U/L (ref 38–126)
Anion gap: 9 (ref 5–15)
BUN: 16 mg/dL (ref 8–23)
CO2: 19 mmol/L — ABNORMAL LOW (ref 22–32)
Calcium: 8.1 mg/dL — ABNORMAL LOW (ref 8.9–10.3)
Chloride: 104 mmol/L (ref 98–111)
Creatinine, Ser: 0.57 mg/dL (ref 0.44–1.00)
GFR, Estimated: >60 mL/min (ref >60)
Glucose, Bld: 154 mg/dL — ABNORMAL HIGH (ref 70–99)
Potassium: 3.6 mmol/L (ref 3.5–5.1)
Sodium: 132 mmol/L — ABNORMAL LOW (ref 135–145)
Total Bilirubin: 0.5 mg/dL (ref 0.0–1.2)
Total Protein: 5.9 g/dL — ABNORMAL LOW (ref 6.5–8.1)

## 2024-04-09 LAB — GLUCOSE, CAPILLARY
Glucose-Capillary: 126 mg/dL — ABNORMAL HIGH (ref 70–99)
Glucose-Capillary: 131 mg/dL — ABNORMAL HIGH (ref 70–99)
Glucose-Capillary: 138 mg/dL — ABNORMAL HIGH (ref 70–99)
Glucose-Capillary: 129 mg/dL — ABNORMAL HIGH (ref 70–99)
Glucose-Capillary: 150 mg/dL — ABNORMAL HIGH (ref 70–99)

## 2024-04-09 LAB — CBC
HCT: 33.7 % — ABNORMAL LOW (ref 36.0–46.0)
Hemoglobin: 11.3 g/dL — ABNORMAL LOW (ref 12.0–15.0)
MCH: 29.7 pg (ref 26.0–34.0)
MCHC: 33.5 g/dL (ref 30.0–36.0)
MCV: 88.5 fL (ref 80.0–100.0)
Platelets: 249 K/uL (ref 150–400)
RBC: 3.81 MIL/uL — ABNORMAL LOW (ref 3.87–5.11)
RDW: 14.3 % (ref 11.5–15.5)
WBC: 38.1 K/uL — ABNORMAL HIGH (ref 4.0–10.5)
nRBC: 0 % (ref 0.0–0.2)

## 2024-04-09 LAB — PHOSPHORUS: Phosphorus: 1.7 mg/dL — ABNORMAL LOW (ref 2.5–4.6)

## 2024-04-09 LAB — MAGNESIUM: Magnesium: 2.1 mg/dL (ref 1.7–2.4)

## 2024-04-09 MED ORDER — TRAVASOL 10 % IV SOLN
INTRAVENOUS | Status: AC
  Filled 2024-04-09: qty 381.6

## 2024-04-09 MED ORDER — IOHEXOL 300 MG/ML  SOLN
100.0000 mL | Freq: Once | INTRAMUSCULAR | Status: AC | PRN
  Administered 2024-04-10: 100 mL via INTRAVENOUS

## 2024-04-09 MED ORDER — POTASSIUM PHOSPHATES 15 MMOLE/5ML IV SOLN
30.0000 mmol | Freq: Once | INTRAVENOUS | Status: AC
  Administered 2024-04-09: 30 mmol via INTRAVENOUS
  Filled 2024-04-09: qty 10

## 2024-04-09 NOTE — Assessment & Plan Note (Signed)
 Nutrition Problem: Moderate Malnutrition Etiology: chronic illness Signs/Symptoms: mild fat depletion, moderate muscle depletion Interventions: Refer to RD note for recommendations, TPN

## 2024-04-09 NOTE — Assessment & Plan Note (Signed)
 Diagnosed in 05/2020 S/p concurrent chemoradiation with weekly carboplatin  for AUC of 2 and paclitaxel  45 mg/M2. S/P 5 X -2022 S/p Consolidation treatment with immunotherapy with Imfinzi  1500 mg IV q 4 wk x 3 cycles - discontinued 2/2 intolerance/sepsis Currently on observation Pleural fluid cytology was negative from 11/12

## 2024-04-09 NOTE — Assessment & Plan Note (Signed)
 A1c 5.9 Will cover with sensitive-scale SSI

## 2024-04-09 NOTE — Assessment & Plan Note (Addendum)
 H/o prior SBO x 2 Patient complained of stomach upset and vomited 11/11; x-ray concerning for SBO  Surgery consulted NG tube inserted Repeat CT abdomen 11/11 small bowel obstruction with proximal transition point just beyond the ligament of Treitz Underwent ex lap with lysis of adhesion and small bowel resection on 11/14 Started on TPN Marked leukocytosis today with reported significant abdominal pain; will order CT C/A/P

## 2024-04-09 NOTE — Progress Notes (Signed)
 Progress Note   Patient: Angelica [name] FMW:[id number] DOB: 03/11/1941 DOA: 03/29/2024     11 DOS: the patient was seen and examined on 04/09/2024   Brief hospital course: 83yo with h/o GERD, HTN, HLD, IDA, asthma, stage 3b adenocarcinoma of the lung, hypothyroidism, CVA, Alzheimer's dementia, and hx of SBO due to adhesions in 2016 and 2021 who presented on 11/9 with weakness and breast swelling. She was noted to have leukocytosis with lactate of 2.1.  CXR with near-complete opacification of the left lung, differential includes large malignant pleural effusion, central hilar mass with obstruction, lobar atelectasis, and/or pneumonia.  CT A/P with large highly located left pleural effusion with compressive atelectasis throughout the left lung.  She was started on antibiotics, pulmonary consulted.  11/11 also found to have SBO, NG tube placed, surgery consulted.  Underwent lysis of adhesions with small bowel resection on 11/14.  On TPN.   Assessment & Plan Pleural effusion associated with pulmonary infection vs. recurrent malignancy and sepsis Presenting with weakness, cough  PCCM following-infection versus malignancy Blood culture no growth so far On room air Continue broad-spectrum antibiotics w/ vancomycin  and cefepime   Chest tube placed 11/12, removed 11/15 Fluid studies suggest possible infectious etiology due to elevated WBC count, elevated LDH, elevated protein fluid but culture negative to date Blood cultures negative Marked leukocytosis today; will order CT C/A/P SBO (small bowel obstruction) (HCC) H/o prior SBO x 2 Patient complained of stomach upset and vomited 11/11; x-ray concerning for SBO  Surgery consulted NG tube inserted Repeat CT abdomen 11/11 small bowel obstruction with proximal transition point just beyond the ligament of Treitz Underwent ex lap with lysis of adhesion and small bowel resection on 11/14 Started on TPN Marked leukocytosis today with reported significant  abdominal pain; will order CT C/A/P Weakness Likely secondary to above Fall precautions PT/OT recommending STR Adenocarcinoma of left lung, stage 3 (HCC) Diagnosed in 05/2020 S/p concurrent chemoradiation with weekly carboplatin  for AUC of 2 and paclitaxel  45 mg/M2. S/P 5 X -2022 S/p Consolidation treatment with immunotherapy with Imfinzi  1500 mg IV q 4 wk x 3 cycles - discontinued 2/2 intolerance/sepsis Currently on observation Pleural fluid cytology was negative from 11/12 HTN (hypertension) Continue amlodipine   PRN metoprolol Elevated troponin Flat and mildly elevated troponin 27>28 No chest pain or palpitations, no changes on EKG  Related to demand ischemia in the setting of sepsis HLD (hyperlipidemia) Continue high intensity statin  History of stroke Allergy to ASA On plavix  and high intensity statin PTA Per neuro note October 30 2023- She had an MRI scan of the brain done on 07/18/2021 which I personally reviewed and shows only mild changes of small vessel disease and generalized atrophy. I do not see a definite right cerebellar stroke but the radiologist has called 1 which apparently was not seen on previous MRI from 06/14/2020. Patient has since been started on Plavix  by Dr. Norleen   Neuro Dr Rosemarie did not feel it was CVA Plavix  is currently on hold Hypothyroidism Normal TSH, mildly elevated free T4 Continue synthroid  50mcg daily  Asthma, severe persistent, well-controlled (HCC) No signs of exacerbation  Continue bronchodilators Alzheimer's disease (HCC) At baseline Continue namenda  Delirium precautions  GERD (gastroesophageal reflux disease) Continue protonix  daily  Malnutrition of moderate degree Nutrition Problem: Moderate Malnutrition Etiology: chronic illness Signs/Symptoms: mild fat depletion, moderate muscle depletion Interventions: Refer to RD note for recommendations, TPN Hypokalemia K+ 2.4 this AM Repleted Recheck in AM Prediabetes A1c 5.9 Will cover with  sensitive-scale SSI  Consultants: Pulmonology Surgery Palliative PT OT Nutrition TOC team   Procedures: Chest tube insertion 11/12 Ex lap, lysis of adhesions, small bowel resection 11/14   Antibiotics: Cefepime  11/9- Metronidazole  11/14- Vancomycin  11/9-14  30 Day Unplanned Readmission Risk Score    Flowsheet Row ED to Hosp-Admission (Current) from 03/29/2024 in [facility]ICU/STEPDOWN  30 Day Unplanned Readmission Risk Score (%) 31.43 Filed at 04/09/2024 0400    This score is the patient's risk of an unplanned readmission within 30 days of being discharged (0 -100%). The score is based on dignosis, age, lab data, medications, orders, and past utilization.   Low:  0-14.9   Medium: 15-21.9   High: 22-29.9   Extreme: 30 and above           Subjective: She is crying in pain today, daughter would prefer to avoid pain medication if possible.   Objective: Vitals:   04/09/24 0600 04/09/24 0700  BP: (!) 97/59 (!) 127/56  Pulse: 68 73  Resp: 12 18  Temp:    SpO2: 99% 99%    Intake/Output Summary (Last 24 hours) at 04/09/2024 0726 Last data filed at 04/09/2024 0700 Gross per 24 hour  Intake 3899.48 ml  Output 3050 ml  Net 849.48 ml   Filed Weights   04/03/24 1327 04/07/24 0400 04/09/24 0524  Weight: 59.6 kg 58.8 kg 62.7 kg    Exam:  General:  Appears very upset, crying due to reported abdominal pain, frail Eyes:  normal lids, iris ENT:  grossly normal hearing, lips & tongue, mmm; NG tube in place Cardiovascular:  RRR. No LE edema.  Respiratory:   CTA bilaterally with no wheezes/rales/rhonchi.  Normal respiratory effort. Abdomen:  soft, NT, ND Skin:  no rash or induration seen on limited exam Musculoskeletal:  grossly normal tone BUE/BLE, good ROM, no bony abnormality Psychiatric:  blunted/labile mood and affect, speech mostly appropriate Neurologic:  CN 2-12 grossly intact, moves all extremities in coordinated fashion  Data  Reviewed: I have reviewed the patient's lab results since admission.  Pertinent labs for today include:  Na++ 132, not clinically significant CO2 19 Glucose 154 Phos 1.7 Albumin  2.1 WBC 38.1, up from 19.8 Hgb 11.3    Family Communication: Daughter was present  Mobility: PT/OT Consulted and are recommending - Skilled Nursing-Short Term Rehab (<3 Hours/Day)04/07/2024 1051    Code Status: Full Code   Disposition: Status is: Inpatient Remains inpatient appropriate because: ongoing management     Time spent: 50 minutes  Unresulted Labs (From admission, onward)     Start     Ordered   04/13/24 0500  Triglycerides  (TPN Lab Panel)  Every Monday (0500),   R     Question:  Specimen collection method  Answer:  Lab=Lab collect   04/07/24 1027   04/10/24 0500  Basic metabolic panel with GFR  Daily,   R     Question:  Specimen collection method  Answer:  Unit=Unit collect   04/08/24 0815   04/10/24 0500  Magnesium   Daily,   R     Question:  Specimen collection method  Answer:  Unit=Unit collect   04/08/24 0815   04/10/24 0500  Phosphorus  Daily,   R     Question:  Specimen collection method  Answer:  Unit=Unit collect   04/08/24 0815   04/09/24 0500  Comprehensive metabolic panel  (TPN Lab Panel)  Every Mon,Thu (0500),   R     Question:  Specimen collection method  Answer:  Lab=Lab  collect   04/07/24 1027   04/09/24 0500  Magnesium   (TPN Lab Panel)  Every Mon,Thu (0500),   R     Question:  Specimen collection method  Answer:  Lab=Lab collect   04/07/24 1027   04/09/24 0500  Phosphorus  (TPN Lab Panel)  Every Mon,Thu (0500),   R     Question:  Specimen collection method  Answer:  Lab=Lab collect   04/07/24 1027   04/09/24 0500  Comprehensive metabolic panel with GFR  Once,   R        04/09/24 0500   04/09/24 0500  Magnesium   Once,   R        04/09/24 0500   04/09/24 0500  Phosphorus  Once,   R        04/09/24 0500             Author: Delon Herald, MD 04/09/2024  7:26 AM  For on call review [email or web address].

## 2024-04-09 NOTE — Assessment & Plan Note (Signed)
 Allergy to ASA On plavix  and high intensity statin PTA Per neuro note October 30 2023- She had an MRI scan of the brain done on 07/18/2021 which I personally reviewed and shows only mild changes of small vessel disease and generalized atrophy. I do not see a definite right cerebellar stroke but the radiologist has called 1 which apparently was not seen on previous MRI from 06/14/2020. Patient has since been started on Plavix  by Dr. Norleen   Neuro Dr Rosemarie did not feel it was CVA Plavix  is currently on hold

## 2024-04-09 NOTE — Assessment & Plan Note (Signed)
 Likely secondary to above Fall precautions PT/OT recommending STR

## 2024-04-09 NOTE — Assessment & Plan Note (Signed)
 K+ 2.4 this AM Repleted Recheck in AM

## 2024-04-09 NOTE — Progress Notes (Signed)
 Physical Therapy Treatment Patient Details Name: Angelica [name] MRN: [id number] DOB: June 12, 1940 Today's Date: 04/09/2024   History of Present Illness Angelica [name] is a 83 y.o. female presented to ED with complaints of weakness; admitted 11/9 with large loculated left pleural effusion.03/31/2024 diagnosed with SBO, s/p ex lap  11/14,placed NGT 04/01/24 s/p pigtail chest tube placed. PMH: GERD, HTN, HLD, IDA, asthma, stage 3b adenocarcinoma of the lung, hypothyroidism, CVA, Alzheimer's, SBO due to adhesions in 2016 and 2021    PT Comments   Patient  barely arouse, unable to really participate. Placed  in bed chair position, leans to the left  more and more. Patient sat upright for ~ 2 minutes. Continue PT as tolerated.    If plan is discharge home, recommend the following: A lot of help with bathing/dressing/bathroom;Assistance with cooking/housework;Assist for transportation;Help with stairs or ramp for entrance;Two people to help with walking and/or transfers   Can travel by private vehicle        Equipment Recommendations  None recommended by PT    Recommendations for Other Services       Precautions / Restrictions Precautions Recall of Precautions/Restrictions: Impaired Precaution/Restrictions Comments: NG, midline incision Restrictions Weight Bearing Restrictions Per Provider Order: No     Mobility  Bed Mobility   Bed Mobility: Rolling Rolling: Total assist, +2 for physical assistance, +2 for safety/equipment         General bed mobility comments: p patient is not able to  rally participate, total assistance  . Placed in bed chair  position, moans and groans,  placed upright, started to lean  more to the left.  Replaced    in supine/sitting upright in bed.    Transfers                        Ambulation/Gait                   Stairs             Wheelchair Mobility     Tilt Bed    Modified Rankin (Stroke Patients Only)        Balance       Sitting balance - Comments: leans to the left  when sitting upright                                    Communication Communication Communication: Impaired Factors Affecting Communication: Difficulty expressing self;Reduced clarity of speech  Cognition Arousal: Lethargic                             PT - Cognition Comments: arouses   when  stimulated, opens eyes   at times Following commands: Impaired      Cueing Cueing Techniques: Verbal cues  Exercises      General Comments        Pertinent Vitals/Pain Pain Assessment Breathing: occasional labored breathing, short period of hyperventilation Negative Vocalization: occasional moan/groan, low speech, negative/disapproving quality Facial Expression: sad, frightened, frown Body Language: tense, distressed pacing, fidgeting Consolability: distracted or reassured by voice/touch PAINAD Score: 5    Home Living                          Prior Function            PT  Goals (current goals can now be found in the care plan section) Progress towards PT goals: Not progressing toward goals - comment    Frequency    Min 2X/week      PT Plan      Co-evaluation              AM-PAC PT 6 Clicks Mobility   Outcome Measure  Help needed turning from your back to your side while in a flat bed without using bedrails?: Total Help needed moving from lying on your back to sitting on the side of a flat bed without using bedrails?: Total Help needed moving to and from a bed to a chair (including a wheelchair)?: Total Help needed standing up from a chair using your arms (e.g., wheelchair or bedside chair)?: Total Help needed to walk in hospital room?: Total Help needed climbing 3-5 steps with a railing? : Total 6 Click Score: 6    End of Session   Activity Tolerance: Patient limited by fatigue Patient left: in bed;with call bell/phone within reach;with bed alarm set;with  family/visitor present Nurse Communication: Mobility status;Need for lift equipment PT Visit Diagnosis: Unsteadiness on feet (R26.81);Muscle weakness (generalized) (M62.81)     Time: 1202-1215 PT Time Calculation (min) (ACUTE ONLY): 13 min  Charges:    $Therapeutic Activity: 8-22 mins PT General Charges $$ ACUTE PT VISIT: 1 Visit                     Darice Potters PT Acute Rehabilitation Services Office [phone]    Potters Darice Norris 04/09/2024, 2:54 PM

## 2024-04-09 NOTE — Assessment & Plan Note (Signed)
 Continue amlodipine   PRN metoprolol

## 2024-04-09 NOTE — Progress Notes (Signed)
 Per phlebotomy first set of lab were hemolyzed, labs had to be re collected, per phlebotomy their short staffed and are behind in collections, also [facility] and will be here as soon as possible to collect AM labs again. Due to patient being on TPN patient is a lab draw and not a unit draw.

## 2024-04-09 NOTE — Assessment & Plan Note (Signed)
 Continue high intensity statin

## 2024-04-09 NOTE — Assessment & Plan Note (Signed)
 Normal TSH, mildly elevated free T4 Continue synthroid  50mcg daily

## 2024-04-09 NOTE — Assessment & Plan Note (Addendum)
 Presenting with weakness, cough  PCCM following-infection versus malignancy Blood culture no growth so far On room air Continue broad-spectrum antibiotics w/ vancomycin  and cefepime   Chest tube placed 11/12, removed 11/15 Fluid studies suggest possible infectious etiology due to elevated WBC count, elevated LDH, elevated protein fluid but culture negative to date Blood cultures negative Marked leukocytosis today; will order CT C/A/P

## 2024-04-09 NOTE — Assessment & Plan Note (Signed)
 At baseline Continue namenda  Delirium precautions

## 2024-04-09 NOTE — Progress Notes (Signed)
 6 Days Post-Op  Subjective: Confused, crying this morning, but unclear why.  She complains of mucus in her mouth which I suctioned, but then she just keeps crying and stating the string is too tight around her abdomen, but there is nothing on her abdomen but her midline dressing.  She says no to abdominal pain  Objective: Vital signs in last 24 hours: Temp:  [97.2 F (36.2 C)-97.9 F (36.6 C)] 97.8 F (36.6 C) (11/20 0806) Pulse Rate:  [59-87] 73 (11/20 0700) Resp:  [12-26] 18 (11/20 0700) BP: (92-162)/(45-77) 127/56 (11/20 0700) SpO2:  [96 %-100 %] 99 % (11/20 0700) Weight:  [62.7 kg] 62.7 kg (11/20 0524) Last BM Date :  (unknown)  Intake/Output from previous day: 11/19 0701 - 11/20 0700 In: 3899.5 [I.V.:2159.3; NG/GT:110; IV Piggyback:1630.2] Out: 3050 [Urine:2750; Emesis/NG output:300] Intake/Output this shift: No intake/output data recorded.  PE: Abd: soft, appropriately tender, incision c/d/I with loose staples and some open areas that are packed and overall clean, ND, NGT with no output currently, down to 300cc in last 24 hrs Psych: confused, crying for unclear reasons  Lab Results:  Recent Labs    04/07/24 0200 04/08/24 0425  WBC 29.6* 19.8*  HGB 12.1 10.7*  HCT 37.3 32.5*  PLT 347 274   BMET Recent Labs    04/07/24 0200 04/08/24 0425 04/08/24 2140  NA 143 135  --   K 3.4* 2.4* 3.7  CL 111 103  --   CO2 23 25  --   GLUCOSE 144* 197*  --   BUN 25* 13  --   CREATININE 0.69 0.50  --   CALCIUM  9.3 8.4*  --    PT/INR No results for input(s): LABPROT, INR in the last 72 hours. CMP     Component Value Date/Time   NA 135 04/08/2024 0425   K 3.7 04/08/2024 2140   CL 103 04/08/2024 0425   CO2 25 04/08/2024 0425   GLUCOSE 197 (H) 04/08/2024 0425   BUN 13 04/08/2024 0425   CREATININE 0.50 04/08/2024 0425   CREATININE 0.96 12/27/2023 0757   CREATININE 0.86 11/02/2022 1604   CALCIUM  8.4 (L) 04/08/2024 0425   PROT 5.8 (L) 04/08/2024 0425   PROT  7.7 06/07/2017 0940   ALBUMIN  2.1 (L) 04/08/2024 0425   AST 15 04/08/2024 0425   AST 24 12/27/2023 0757   ALT 5 04/08/2024 0425   ALT 15 12/27/2023 0757   ALKPHOS 59 04/08/2024 0425   BILITOT 0.2 04/08/2024 0425   BILITOT 0.7 12/27/2023 0757   GFRNONAA >60 04/08/2024 0425   GFRNONAA 58 (L) 12/27/2023 0757   GFRAA >60 08/15/2017 0012   Lipase     Component Value Date/Time   LIPASE 30 10/09/2023 1042       Studies/Results: No results found.   Anti-infectives: Anti-infectives (From admission, onward)    Start     Dose/Rate Route Frequency Ordered Stop   04/06/24 1300  metroNIDAZOLE  (FLAGYL ) IVPB 500 mg        500 mg 100 mL/hr over 60 Minutes Intravenous Every 12 hours 04/06/24 1217     04/03/24 1330  metroNIDAZOLE  (FLAGYL ) IVPB 500 mg        500 mg 100 mL/hr over 60 Minutes Intravenous On call to O.R. 04/03/24 1242 04/03/24 1507   03/30/24 2200  vancomycin  (VANCOREADY) IVPB 750 mg/150 mL  Status:  Discontinued        750 mg 150 mL/hr over 60 Minutes Intravenous Every 24 hours 03/29/24 1620  04/03/24 1310   03/30/24 0400  ceFEPIme  (MAXIPIME ) 2 g in sodium chloride  0.9 % 100 mL IVPB        2 g 200 mL/hr over 30 Minutes Intravenous Every 12 hours 03/29/24 1620     03/29/24 1530  vancomycin  (VANCOCIN ) IVPB 1000 mg/200 mL premix        1,000 mg 200 mL/hr over 60 Minutes Intravenous  Once 03/29/24 1517 03/30/24 0228   03/29/24 1530  ceFEPIme  (MAXIPIME ) 2 g in sodium chloride  0.9 % 100 mL IVPB        2 g 200 mL/hr over 30 Minutes Intravenous  Once 03/29/24 1518 03/29/24 1724        Assessment/Plan POD 6, s/p ex lap with SBR, Dr. Teresa 11/14 -unable to tell if patient having bowel function, but NGT output continues to downtrend.  Will try to clamp this today and see how she does. -unclear if she is crying just due to delirium or if something is bothering her.  Will await labs today.  If WBC continues to downtrend and remains AF, will hold on repeat CT right now. -mobilize  as able, PT/OT eval -picc and TNA given poor nutrition and the likelihood that this patient will struggle with enteral intake as well moving forward -K to be replaced by pharmacy in TNA, labs pending today -spoke to Taron, patient's daughter, to give update  FEN - NGT clamped/NPOx ice/TNA/IVFs per primary VTE - Lovenox  ID - Cefipime, flagyl , monitor WBC, if continues to improve consider dc on POD 7  HTN HLD h/o CVA Alzheimer's disease (although oriented currently) Stage III adenocarcinoma of lung GERD Hypothyroidism PCM - start TNA    LOS: 11 days    Burnard FORBES Banter , [facility] 04/09/2024, 8:54 AM Please see Amion for pager number during day hours 7:00am-4:30pm or 7:00am -11:30am on weekends

## 2024-04-09 NOTE — Progress Notes (Addendum)
 PHARMACY - TOTAL PARENTERAL NUTRITION CONSULT NOTE   Indication: Prolonged ileus  Patient Measurements: Height: 5' 5 (165.1 cm) Weight: 62.7 kg (138 lb 3.7 oz) IBW/kg (Calculated) : 57 TPN AdjBW (KG): 59.6 Body mass index is 23 kg/m.  Assessment: Presented with abdominal pain and weakness - 11/14: Ex lap with LOA, SBR and repair Prolonged post-op ileus.  Glucose / Insulin : No h/o DM. A1c 5.9% -BG goal <180. BG range: 119-185 on TPN -7 units SSI/24 hrs Electrolytes: Na 132, phos 1.7  -CorrCa (9.6) WNL -K 3.6, Mg 2.1 improved -Goal electrolytes with ileus: K >/= 4, Mg >/=2 Renal: Scr <1, BUN WNL Hepatic: LFTs, Alk Phos, T.bili all WNL. Albumin  low Intake / Output; MIVF:  -mIVF: NS @ 75 mL/hr -UOP: 2780 mL, NG output: 300 mL overnight (decreasing) GI Imaging: 11/11 CT: SBO 11/13: AbdXR: SBO 11/14: AbdXR: SBO  GI Surgeries / Procedures:  -11/14: Ex lap with LOA, SBR and repair  Central access: Double lumen PICC placed 11/18 TPN start date:  04/07/24  Nutritional Goals: Goal TPN rate is 65 mL/hr (provides 83 g of protein and 1446 kcals per day)  RD Assessment: Estimated Needs Total Energy Estimated Needs: 1450-1600 kcals Total Protein Estimated Needs: 70-85 grams Total Fluid Estimated Needs: >/= 1.5L  Current Nutrition:  NPO TPN  Lipid recommendations in TPN: - Peanut allergy: Use SMOFlipid with potential cross reactivity risk with soybean - Fish Allergy: (non-anaphylactic): Use Intralipid  - Shellfish Allergy (non-anaphylactic): If fish allergy confirmed or the patient avoids all seafood, Use Intralipid   - For the short-term (< 7-10 days), benefits/risks can be discussed with patient or intravenous fat emulsions can be held    Plan:  Potassium phos 30 mmol IV x 1 Plan to clamp NG tube to see how pt tolerates Continue TPN at 30 mL/hr for now given phosphorus remains low No lipids due to allergies Will provide 667 kcal, 38 g protein, 151 g  dextrose  Electrolytes in TPN: defer changes in TPN until more stable in setting of refeeding Na 50 mEq/L, K 50 mEq/L, Ca 5 mEq/L, Mg 5 mEq/L, and Phos 15 mmol/L.  Cl:Ac 1:2 Add standard MVI and trace elements to TPN Continue thiamine 100 mg IV daily for 6 days Continue sensitive SSI q4h and adjust as needed  Continue mIVF NS at 75 ml/hr until able to start increasing TPN Monitor TPN labs on Mon/Thurs. Check electrolytes daily for now.  Stefano MARLA Bologna, PharmD, BCPS Clinical Pharmacist 04/09/2024 1:58 PM

## 2024-04-09 NOTE — Assessment & Plan Note (Signed)
 Flat and mildly elevated troponin 27>28 No chest pain or palpitations, no changes on EKG  Related to demand ischemia in the setting of sepsis

## 2024-04-09 NOTE — TOC Progression Note (Signed)
 Transition of Care [facility]) - Progression Note    Patient Details  Name: Angelica [name] MRN: [id number] Date of Birth: 1940-11-04  [facility] CM/SW Contact  Jon ONEIDA Anon, RN Phone Number: 04/09/2024, 11:22 AM  Clinical Narrative:    IP CM acknowledges PT/OT rec for STR. Currently pt has NG tube in place to low suction, needing TPN. Pt needing continued medical workup, not medically stable for discharge. Once pt is closer to medical readiness will send out referrals for SNF placement. IP CM will continue to follow.      Expected Discharge Plan: Home w Home Health Services Barriers to Discharge: Continued Medical Work up               Expected Discharge Plan and Services In-house Referral: Clinical Social Work   Post Acute Care Choice: Home Health Living arrangements for the past 2 months: Single Family Home                 DME Arranged: N/A DME Agency: NA                   Social Drivers of Health (SDOH) Interventions SDOH Screenings   Food Insecurity: No Food Insecurity (03/29/2024)  Housing: Low Risk  (03/29/2024)  Transportation Needs: No Transportation Needs (03/29/2024)  Utilities: Not At Risk (03/29/2024)  Alcohol Screen: Low Risk  (08/20/2023)  Depression (PHQ2-9): Low Risk  (12/17/2023)  Financial Resource Strain: Low Risk  (12/15/2023)  Physical Activity: Inactive (12/15/2023)  Social Connections: Moderately Integrated (12/15/2023)  Stress: No Stress Concern Present (12/15/2023)  Tobacco Use: Medium Risk (04/03/2024)  Health Literacy: Adequate Health Literacy (08/20/2023)    Readmission Risk Interventions    10/08/2022    2:08 PM  Readmission Risk Prevention Plan  Transportation Screening Complete  PCP or Specialist Appt within 5-7 Days Complete  Home Care Screening Complete  Medication Review (RN CM) Complete

## 2024-04-09 NOTE — Assessment & Plan Note (Signed)
Continue protonix daily. 

## 2024-04-09 NOTE — Assessment & Plan Note (Signed)
 No signs of exacerbation  Continue bronchodilators

## 2024-04-10 ENCOUNTER — Inpatient Hospital Stay (HOSPITAL_COMMUNITY)

## 2024-04-10 DIAGNOSIS — J918 Pleural effusion in other conditions classified elsewhere: Secondary | ICD-10-CM | POA: Diagnosis not present

## 2024-04-10 DIAGNOSIS — E878 Other disorders of electrolyte and fluid balance, not elsewhere classified: Secondary | ICD-10-CM | POA: Insufficient documentation

## 2024-04-10 DIAGNOSIS — J189 Pneumonia, unspecified organism: Secondary | ICD-10-CM | POA: Diagnosis not present

## 2024-04-10 DIAGNOSIS — R001 Bradycardia, unspecified: Secondary | ICD-10-CM | POA: Insufficient documentation

## 2024-04-10 LAB — GLUCOSE, CAPILLARY
Glucose-Capillary: 115 mg/dL — ABNORMAL HIGH (ref 70–99)
Glucose-Capillary: 122 mg/dL — ABNORMAL HIGH (ref 70–99)
Glucose-Capillary: 129 mg/dL — ABNORMAL HIGH (ref 70–99)
Glucose-Capillary: 140 mg/dL — ABNORMAL HIGH (ref 70–99)
Glucose-Capillary: 84 mg/dL (ref 70–99)
Glucose-Capillary: 96 mg/dL (ref 70–99)

## 2024-04-10 LAB — CBC
HCT: 29.4 % — ABNORMAL LOW (ref 36.0–46.0)
Hemoglobin: 9.8 g/dL — ABNORMAL LOW (ref 12.0–15.0)
MCH: 29.9 pg (ref 26.0–34.0)
MCHC: 33.3 g/dL (ref 30.0–36.0)
MCV: 89.6 fL (ref 80.0–100.0)
Platelets: 203 K/uL (ref 150–400)
RBC: 3.28 MIL/uL — ABNORMAL LOW (ref 3.87–5.11)
RDW: 14.5 % (ref 11.5–15.5)
WBC: 34.8 K/uL — ABNORMAL HIGH (ref 4.0–10.5)
nRBC: 0 % (ref 0.0–0.2)

## 2024-04-10 LAB — BASIC METABOLIC PANEL WITH GFR
Anion gap: 6 (ref 5–15)
BUN: 15 mg/dL (ref 8–23)
CO2: 22 mmol/L (ref 22–32)
Calcium: 7.9 mg/dL — ABNORMAL LOW (ref 8.9–10.3)
Chloride: 105 mmol/L (ref 98–111)
Creatinine, Ser: 0.51 mg/dL (ref 0.44–1.00)
GFR, Estimated: >60 mL/min (ref >60)
Glucose, Bld: 139 mg/dL — ABNORMAL HIGH (ref 70–99)
Potassium: 3.7 mmol/L (ref 3.5–5.1)
Sodium: 134 mmol/L — ABNORMAL LOW (ref 135–145)

## 2024-04-10 LAB — PHOSPHORUS: Phosphorus: 1.9 mg/dL — ABNORMAL LOW (ref 2.5–4.6)

## 2024-04-10 LAB — MAGNESIUM: Magnesium: 1.6 mg/dL — ABNORMAL LOW (ref 1.7–2.4)

## 2024-04-10 MED ORDER — MAGNESIUM SULFATE 4 GM/100ML IV SOLN
4.0000 g | Freq: Once | INTRAVENOUS | Status: AC
  Administered 2024-04-10: 4 g via INTRAVENOUS
  Filled 2024-04-10: qty 100

## 2024-04-10 MED ORDER — MIDAZOLAM HCL 2 MG/2ML IJ SOLN
INTRAMUSCULAR | Status: AC
Start: 2024-04-10 — End: 2024-04-10
  Filled 2024-04-10: qty 2

## 2024-04-10 MED ORDER — ENOXAPARIN SODIUM 40 MG/0.4ML IJ SOSY
40.0000 mg | PREFILLED_SYRINGE | INTRAMUSCULAR | Status: DC
  Administered 2024-04-11 – 2024-04-15 (×5): 40 mg via SUBCUTANEOUS
  Filled 2024-04-10 (×5): qty 0.4

## 2024-04-10 MED ORDER — SODIUM CHLORIDE 0.9% FLUSH
5.0000 mL | Freq: Three times a day (TID) | INTRAVENOUS | Status: DC
  Administered 2024-04-10 – 2024-04-17 (×21): 5 mL

## 2024-04-10 MED ORDER — PRAVASTATIN SODIUM 20 MG PO TABS
80.0000 mg | ORAL_TABLET | Freq: Every day | ORAL | Status: DC
  Administered 2024-04-11 – 2024-04-23 (×13): 80 mg via ORAL
  Filled 2024-04-10 (×13): qty 4

## 2024-04-10 MED ORDER — FENTANYL CITRATE (PF) 100 MCG/2ML IJ SOLN
INTRAMUSCULAR | Status: AC | PRN
  Administered 2024-04-10 (×2): 25 ug via INTRAVENOUS

## 2024-04-10 MED ORDER — POTASSIUM PHOSPHATES 15 MMOLE/5ML IV SOLN
30.0000 mmol | Freq: Once | INTRAVENOUS | Status: AC
  Administered 2024-04-10: 30 mmol via INTRAVENOUS
  Filled 2024-04-10: qty 10

## 2024-04-10 MED ORDER — TRAVASOL 10 % IV SOLN
INTRAVENOUS | Status: AC
  Filled 2024-04-10: qty 381.6

## 2024-04-10 MED ORDER — FENTANYL CITRATE (PF) 100 MCG/2ML IJ SOLN
INTRAMUSCULAR | Status: AC
  Filled 2024-04-10: qty 2

## 2024-04-10 MED ORDER — MONTELUKAST SODIUM 10 MG PO TABS
10.0000 mg | ORAL_TABLET | Freq: Every day | ORAL | Status: DC
  Administered 2024-04-12 – 2024-04-22 (×11): 10 mg via ORAL
  Filled 2024-04-10 (×12): qty 1

## 2024-04-10 MED ORDER — MIDAZOLAM HCL (PF) 2 MG/2ML IJ SOLN
INTRAMUSCULAR | Status: AC | PRN
  Administered 2024-04-10 (×2): .5 mg via INTRAVENOUS

## 2024-04-10 MED ORDER — LEVOTHYROXINE SODIUM 50 MCG PO TABS
50.0000 ug | ORAL_TABLET | Freq: Every day | ORAL | Status: DC
  Administered 2024-04-11 – 2024-04-23 (×12): 50 ug via ORAL
  Filled 2024-04-10 (×12): qty 1

## 2024-04-10 MED ORDER — INSULIN ASPART 100 UNIT/ML IJ SOLN
0.0000 [IU] | Freq: Four times a day (QID) | INTRAMUSCULAR | Status: DC
  Administered 2024-04-10: 1 [IU] via SUBCUTANEOUS
  Administered 2024-04-11: 2 [IU] via SUBCUTANEOUS
  Administered 2024-04-11: 1 [IU] via SUBCUTANEOUS
  Filled 2024-04-10: qty 2
  Filled 2024-04-10: qty 1

## 2024-04-10 NOTE — Sedation Documentation (Signed)
Unable to obtain O2 sat.

## 2024-04-10 NOTE — Assessment & Plan Note (Signed)
 A1c 5.9 Will cover with sensitive-scale SSI

## 2024-04-10 NOTE — Assessment & Plan Note (Signed)
 Continue amlodipine   PRN metoprolol

## 2024-04-10 NOTE — Assessment & Plan Note (Signed)
 Replete K+, Mag++, Phos as needed

## 2024-04-10 NOTE — Progress Notes (Signed)
 The  patient is bedfast and has dementia. She mumbles few words and is incapable of calling for the bathroom. Messaged Lavanda Horns to request a purewick order

## 2024-04-10 NOTE — Assessment & Plan Note (Signed)
 Diagnosed in 05/2020 S/p concurrent chemoradiation with weekly carboplatin  for AUC of 2 and paclitaxel  45 mg/M2. S/P 5 X -2022 S/p Consolidation treatment with immunotherapy with Imfinzi  1500 mg IV q 4 wk x 3 cycles - discontinued 2/2 intolerance/sepsis Currently on observation Pleural fluid cytology was negative from 11/12

## 2024-04-10 NOTE — Assessment & Plan Note (Signed)
 Likely secondary to above Fall precautions PT/OT recommending STR

## 2024-04-10 NOTE — Assessment & Plan Note (Signed)
 K+ 2.4 this AM Repleted Recheck in AM

## 2024-04-10 NOTE — Assessment & Plan Note (Signed)
 RN noted periodic short episodes of bradycardia with sleep No intervention would be appropriate Will continue to monitor on telemetry

## 2024-04-10 NOTE — Plan of Care (Signed)
  Problem: Clinical Measurements: Goal: Cardiovascular complication will be avoided Outcome: Progressing   Problem: Clinical Measurements: Goal: Respiratory complications will improve Outcome: Progressing   Problem: Activity: Goal: Risk for activity intolerance will decrease Outcome: Progressing   Problem: Clinical Measurements: Goal: Will remain free from infection Outcome: Progressing

## 2024-04-10 NOTE — Assessment & Plan Note (Signed)
 Continue high intensity statin

## 2024-04-10 NOTE — Progress Notes (Signed)
 Occupational Therapy Treatment Patient Details Name: Angelica [name] MRN: [id number] DOB: [birth year] Today's Date: 04/10/2024   History of present illness Angelica [name] is a 83 y.o. female presented to ED with complaints of weakness; admitted 11/9 with large loculated left pleural effusion.03/31/2024 diagnosed with SBO, s/p ex lap  11/14,placed NGT 04/01/24 s/p pigtail chest tube placed. PMH: GERD, HTN, HLD, IDA, asthma, stage 3b adenocarcinoma of the lung, hypothyroidism, CVA, Alzheimer's, SBO due to adhesions in 2016 and 2021   OT comments  Patient seen for skilled OT session this am. Improved LOA noted with increased bed level participation in simple grooming and UE/LE bed level exercises. See below for current functional level with therapy presented tasks. Continues to present with pain when bed moved toward chair position but able to tolerate more upright position than prior visit. Patient requires continued Acute care hospital level OT services to progress safety and functional performance and allow for discharge. Patient will benefit from continued inpatient follow up therapy, <3 hours/day.         If plan is discharge home, recommend the following:  Two people to help with walking and/or transfers;Two people to help with bathing/dressing/bathroom;Assistance with cooking/housework;Direct supervision/assist for medications management;Direct supervision/assist for financial management;Assist for transportation;Help with stairs or ramp for entrance;Supervision due to cognitive status   Equipment Recommendations  None recommended by OT       Precautions / Restrictions Precautions Precautions: Fall Recall of Precautions/Restrictions: Impaired Precaution/Restrictions Comments: midline incision Restrictions Weight Bearing Restrictions Per Provider Order: No       Mobility Bed Mobility Overal bed mobility: Needs Assistance Bed Mobility: Rolling Rolling: Total assist, +2 for  physical assistance, +2 for safety/equipment         General bed mobility comments: improved participation this am in bed activity and tolerated more toward chair position for session but then placed HOB at 40 degrees where more comfortable due to report of abdominal discomfort with increased HOB toward fully upright    Transfers  Remained bed level due to pain and awaiting IR procedure                            ADL either performed or assessed with clinical judgement   ADL Overall ADL's : Needs assistance/impaired Eating/Feeding: NPO Eating/Feeding Details (indicate cue type and reason): NG d/c'd, for IR proceedure this day Grooming: Wash/dry hands;Wash/dry face;Oral care;Moderate assistance;Bed level Grooming Details (indicate cue type and reason): improved attention and bilatral UE integration Upper Body Bathing: Bed level;Total assistance   Lower Body Bathing: Bed level;+2 for physical assistance;Total assistance   Upper Body Dressing : Bed level;Total assistance Upper Body Dressing Details (indicate cue type and reason): doff/donn gown Lower Body Dressing: Bed level;Total assistance;+2 for physical assistance Lower Body Dressing Details (indicate cue type and reason): doff / don underwear. pt does not follow commands to participate, NT in room to assist     Toileting- Clothing Manipulation and Hygiene: Bed level;Total assistance;+2 for physical assistance         General ADL Comments: moved bed toward chair position for simple grooming and light UE therex and replaced head to ~ 40 degrees at end of session for comfort    Extremity/Trunk Assessment Upper Extremity Assessment Upper Extremity Assessment: Generalized weakness;Right hand dominant   Lower Extremity Assessment Lower Extremity Assessment: Defer to PT evaluation                 Communication Communication  Communication: Impaired Factors Affecting Communication: Difficulty expressing  self;Reduced clarity of speech   Cognition Arousal: Lethargic, Alert (fluctuated between) Behavior During Therapy: WFL for tasks assessed/performed Cognition: Cognition impaired   Orientation impairments: Situation, Time Awareness: Intellectual awareness impaired Memory impairment (select all impairments): Short-term memory Attention impairment (select first level of impairment): Focused attention Executive functioning impairment (select all impairments): Initiation, Organization, Sequencing, Reasoning, Problem solving OT - Cognition Comments: intermittent lethargy, able to verbalize discomfort, followed increased motor and light BADL directives this session with less delay in processing and + follow through                 Following commands: Impaired Following commands impaired: Follows one step commands inconsistently      Cueing   Cueing Techniques: Verbal cues  Exercises Exercises: General Upper Extremity General Exercises - Upper Extremity Shoulder Flexion: AAROM, 10 reps, Both Shoulder Extension: AAROM, Both, 10 reps Shoulder ABduction: AAROM, 10 reps, Both Elbow Flexion: AROM, 10 reps, Both Elbow Extension: AROM, Both, 10 reps Digit Composite Flexion: AROM, Both, 10 reps Composite Extension: AROM, Both, 10 reps General Exercises - Lower Extremity Ankle Circles/Pumps: Both, 10 reps, AROM (able to perform actively this session) Heel Slides: AAROM, Both, 10 reps Hip ABduction/ADduction: AAROM, Both, 10 reps       General Comments positioning and need for HOB >30 degrees, no SOB on HF 2 ltrs via [place] 95% SpO2    Pertinent Vitals/ Pain       Pain Assessment Pain Assessment: Faces Faces Pain Scale: Hurts a little bit Breathing: normal Negative Vocalization: occasional moan/groan, low speech, negative/disapproving quality Facial Expression: smiling or inexpressive Body Language: relaxed Consolability: no need to console PAINAD Score: 1 Facial Expression:  Tense Body Movements: Protection Muscle Tension: Tense, rigid Compliance with ventilator (intubated pts.): N/A Vocalization (extubated pts.): Sighing, moaning CPOT Total: 4 Pain Location: abdomen when HOB elevated >45 degrees Pain Descriptors / Indicators: Grimacing Pain Intervention(s): Monitored during session, Repositioned   Frequency  Min 2X/week        Progress Toward Goals  OT Goals(current goals can now be found in the care plan section)  Progress towards OT goals: Progressing toward goals  Acute Rehab OT Goals OT Goal Formulation: Patient unable to participate in goal setting Time For Goal Achievement: 04/17/24 Potential to Achieve Goals: Fair ADL Goals Pt Will Perform Grooming: sitting;with min assist Pt Will Perform Upper Body Bathing: sitting;with min assist Pt Will Transfer to Toilet: with min assist;stand pivot transfer;bedside commode;with mod assist Pt Will Perform Toileting - Clothing Manipulation and hygiene: with mod assist;sit to/from stand;sitting/lateral leans  Plan         AM-PAC OT 6 Clicks Daily Activity     Outcome Measure   Help from another person eating meals?: Total (NPO for IR this day) Help from another person taking care of personal grooming?: A Lot Help from another person toileting, which includes using toliet, bedpan, or urinal?: Total Help from another person bathing (including washing, rinsing, drying)?: Total Help from another person to put on and taking off regular upper body clothing?: Total Help from another person to put on and taking off regular lower body clothing?: Total 6 Click Score: 7    End of Session    OT Visit Diagnosis: Pain;Muscle weakness (generalized) (M62.81);Other symptoms and signs involving cognitive function;Other abnormalities of gait and mobility (R26.89);Unsteadiness on feet (R26.81) Pain - part of body:  (abdominal)   Activity Tolerance Patient limited by fatigue;Patient limited by pain  Patient  Left in bed;with call bell/phone within reach;with bed alarm set   Nurse Communication Mobility status        Time: 8985-8963 OT Time Calculation (min): 22 min  Charges: OT General Charges $OT Visit: 1 Visit OT Treatments $Therapeutic Activity: 8-22 mins  Bobbie Valletta OT/L Acute Rehabilitation Department  [phone]  04/10/2024, 12:30 PM

## 2024-04-10 NOTE — Assessment & Plan Note (Signed)
Continue protonix daily. 

## 2024-04-10 NOTE — Progress Notes (Signed)
 PHARMACY - TOTAL PARENTERAL NUTRITION CONSULT NOTE   Indication: Prolonged ileus  Patient Measurements: Height: 5' 5 (165.1 cm) Weight: 62.7 kg (138 lb 3.7 oz) IBW/kg (Calculated) : 57 TPN AdjBW (KG): 59.6 Body mass index is 23 kg/m.  Assessment: Presented with abdominal pain and weakness - 11/14: Ex lap with LOA, SBR and repair Prolonged post-op ileus.  Glucose / Insulin : No h/o DM. A1c 5.9% -BG goal <180. BG range: 95 - 150 (4 units SSI/24 hrs) Electrolytes: Na (134), Mg (1.6), Phos (1.9) all low -K WNL but below goal of 4; CorrCa (9.4) WNL -Goal electrolytes with ileus: K >/= 4, Mg >/=2 Renal: Scr <1, BUN WNL and stable Hepatic: LFTs, Alk Phos, T.bili all WNL. Albumin  low Intake / Output; MIVF:  -mIVF: NS @ 75 mL/hr -UOP: 1400 mL, removing NG today GI Imaging: 11/11 CT: SBO 11/13: AbdXR: SBO 11/14: AbdXR: SBO  GI Surgeries / Procedures:  -11/14: Ex lap with LOA, SBR and repair  Central access: Double lumen PICC placed 11/18 TPN start date:  04/07/24  Nutritional Goals: Goal TPN rate is 65 mL/hr (provides 83 g of protein and 1446 kcals per day)  RD Assessment: Estimated Needs Total Energy Estimated Needs: 1450-1600 kcals Total Protein Estimated Needs: 70-85 grams Total Fluid Estimated Needs: >/= 1.5L  Current Nutrition:  NPO TPN  Lipid recommendations in TPN: - Peanut allergy: Use SMOFlipid with potential cross reactivity risk with soybean - Fish Allergy: (non-anaphylactic): Use Intralipid  - Shellfish Allergy (non-anaphylactic): If fish allergy confirmed or the patient avoids all seafood, Use Intralipid   - For the short-term (< 7-10 days), benefits/risks can be discussed with patient or intravenous fat emulsions can be held    Plan:  Now:  -Reduce frequency of sSSI from q4h to q6h -Mg 4 g IV once -K Phos  30 mmol IV once  At 18:00: Continue TPN at 30 mL/hr  Will hold off on advancing rate due to refeeding No lipids due to allergies Will provide 667  kcal, 38 g protein, 151 g dextrose  Electrolytes in TPN: Increase Na, will supplement electrolytes outside of TPN until more stable Na 100 mEq/L, K 50 mEq/L, Ca 5 mEq/L, Mg 5 mEq/L, and Phos 15 mmol/L.  Cl:Ac 1:2 Add standard MVI and trace elements to TPN Continue thiamine  100 mg IV daily for 6 days Continue sensitive SSI q6h and adjust as needed  Decrease mIVF to 40 mL/hr Monitor TPN labs on Mon/Thurs. Check electrolytes daily through the weekend.   Ronal CHRISTELLA Rav, PharmD, BCPS Clinical Pharmacist 04/10/2024 10:43 AM

## 2024-04-10 NOTE — Assessment & Plan Note (Signed)
 Presenting with weakness, cough  PCCM following-infection versus malignancy Blood culture no growth so far On room air Continue broad-spectrum antibiotics w/ vancomycin  and cefepime   Chest tube placed 11/12, removed 11/15 Fluid studies suggest possible infectious etiology due to elevated WBC count, elevated LDH, elevated protein fluid but culture negative to date Blood cultures negative Marked leukocytosis 11/20; see below re: abdomen but CT chest relatively unremarkable

## 2024-04-10 NOTE — Assessment & Plan Note (Signed)
 At baseline Continue namenda  Delirium precautions

## 2024-04-10 NOTE — Progress Notes (Signed)
 7 Days Post-Op  Subjective: Resting.  Daughter at bedside.  Discussed CT results with daughter.  No BM, but patient feels like she needs to have one.  No issues with NGT clamped all day.  No nausea  Objective: Vital signs in last 24 hours: Temp:  [97.4 F (36.3 C)-98.2 F (36.8 C)] 97.4 F (36.3 C) (11/21 0400) Pulse Rate:  [68-108] 76 (11/21 0700) Resp:  [10-27] 24 (11/21 0700) BP: (101-171)/(50-103) 129/79 (11/21 0700) SpO2:  [96 %-100 %] 100 % (11/21 0700) Last BM Date :  (unknown)  Intake/Output from previous day: 11/20 0701 - 11/21 0700 In: 3229.3 [I.V.:2313; IV Piggyback:916.3] Out: 1400 [Urine:1400] Intake/Output this shift: No intake/output data recorded.  PE: Abd: soft, appropriately tender, incision c/d/I with loose staples and some open areas that are packed and overall clean, ND, NGT clamped  Lab Results:  Recent Labs    04/08/24 0425 04/09/24 1039  WBC 19.8* 38.1*  HGB 10.7* 11.3*  HCT 32.5* 33.7*  PLT 274 249   BMET Recent Labs    04/08/24 0425 04/08/24 2140 04/09/24 1039  NA 135  --  132*  K 2.4* 3.7 3.6  CL 103  --  104  CO2 25  --  19*  GLUCOSE 197*  --  154*  BUN 13  --  16  CREATININE 0.50  --  0.57  CALCIUM  8.4*  --  8.1*   PT/INR No results for input(s): LABPROT, INR in the last 72 hours. CMP     Component Value Date/Time   NA 132 (L) 04/09/2024 1039   K 3.6 04/09/2024 1039   CL 104 04/09/2024 1039   CO2 19 (L) 04/09/2024 1039   GLUCOSE 154 (H) 04/09/2024 1039   BUN 16 04/09/2024 1039   CREATININE 0.57 04/09/2024 1039   CREATININE 0.96 12/27/2023 0757   CREATININE 0.86 11/02/2022 1604   CALCIUM  8.1 (L) 04/09/2024 1039   PROT 5.9 (L) 04/09/2024 1039   PROT 7.7 06/07/2017 0940   ALBUMIN  2.1 (L) 04/09/2024 1039   AST 17 04/09/2024 1039   AST 24 12/27/2023 0757   ALT <5 04/09/2024 1039   ALT 15 12/27/2023 0757   ALKPHOS 58 04/09/2024 1039   BILITOT 0.5 04/09/2024 1039   BILITOT 0.7 12/27/2023 0757   GFRNONAA >60  04/09/2024 1039   GFRNONAA 58 (L) 12/27/2023 0757   GFRAA >60 08/15/2017 0012   Lipase     Component Value Date/Time   LIPASE 30 10/09/2023 1042       Studies/Results: CT CHEST ABDOMEN PELVIS W CONTRAST Result Date: 04/10/2024 EXAM: CT CHEST ABDOMEN PELVIS WITH THORACIC AND LUMBAR SPINE RECONSTRUCTIONS 04/10/2024 12:44:37 AM TECHNIQUE: CT of the chest, abdomen, pelvis was performed after the administration of 100 mL of iohexol  (OMNIPAQUE ) 300 MG/ML solution. Multiplanar reformatted images are provided for review, including reconstructed images of the thoracic and lumbar spine. Automated exposure control, iterative reconstruction, and/or weight based adjustment of the mA/kV was utilized to reduce the radiation dose to as low as reasonably achievable. COMPARISON: 03/31/2024 and 03/29/2024 CLINICAL HISTORY: Leukocystosis, stage III lung cancer, recent pleural effusion with chest tube, recent ex lap with SBR for SBO. *tracking code: Bo* FINDINGS: CT CHEST: THORACIC AORTA: Mild atherosclerotic calcification within the thoracic aorta. No aortic aneurysm. No acute traumatic injury of the aorta. MEDIASTINUM: Extensive multivessel coronary artery calcification. Global cardiac size within normal limits. No pericardial effusion. Right upper extremity PICC line tip seen within the superior vena cava. 3.8 cm left thyroid  nodule,  not well characterized on this examination. Nasogastric tube tip seen within the distal body of the stomach. Esophagus unremarkable. No pathologic thoracic adenopathy. No mediastinal hematoma or pneumomediastinum. No acute traumatic injury to the heart or pericardium. The central airways are clear. LUNGS: Small dependent right pleural effusion has increased since prior examination. Loculated left pleural effusion noted on prior examination has been largely evacuated though a residual posterior collection is seen measuring 2.1 x 6.1 x 5.0 cm (volume 33.4 cm3). Small adjacent free flowing  pleural effusion is present. Subtotal collapse of the left lower lobe. Band-like atelectasis within the left upper lobe. No superimposed confluent pulmonary infiltrate. Compressive atelectasis of the right lower lobe. No pneumothorax. No central obstructing lesion. No acute traumatic injury to the lungs. No pulmonary contusion or laceration. CHEST WALL: No acute displaced rib fracture. No chest wall hematoma. CT ABDOMEN AND PELVIS: ABDOMINAL AORTA: Mild aortoiliac atherosclerotic calcification. No acute traumatic injury of the aorta or iliac arteries. HEPATOBILIARY: No acute traumatic injury. SPLEEN: No acute traumatic injury. PANCREAS: No acute traumatic injury. ADRENAL GLANDS: No acute traumatic injury. KIDNEYS: No acute traumatic injury. No hydronephrosis. GI TRACT: Interval partial small bowel resection with resolution of the previously noted small bowel obstruction. Oral contrast now extends into the distal transverse colon. Adjacent to the anastomosis is a loculated, rim enhancing intraperitoneal fluid collection measuring 3.1 x 3.7 x 8.3 cm (volume 49.7 cm3) concerning for a postoperative abscess. Second similar loculated, rim enhancing peritoneal fluid collection seen anteriorly within the left lower quadrant measuring 1.8 x 3.8 x 10.5 cm (volume 37.3 cm3). PERITONEUM: Loculated, rim enhancing intraperitoneal fluid collections as described in the GI tract section. No free intraperitoneal gas. RETROPERITONEUM: No retroperitoneal hematoma. BLADDER: Foley catheter balloon seen within the incompletely decompressed bladder lumen. REPRODUCTIVE ORGANS: Uterus absent. No adnexal masses. BONES: Osseous structures are age appropriate. No acute bone abnormality. No lytic or blastic bone lesion. No acute traumatic fracture of the pelvis. THORACIC AND LUMBAR SPINE: BONES AND ALIGNMENT: No traumatic fracture or traumatic malalignment. DEGENERATIVE CHANGES: No severe spinal canal stenosis or bony neural foraminal  narrowing. SOFT TISSUES: No paraspinal mass or hematoma. IMPRESSION: 1. Interval partial small bowel resection with resolution of the previously noted small bowel obstruction. Oral contrast now extends into the distal transverse colon. 2. Loculated, rim enhancing intraperitoneal fluid collection adjacent to the anastomosis measuring 3.1 x 3.7 x 8.3 cm (approximate volume 49 mL), concerning for a postoperative abscess. 3. Second similar loculated, rim enhancing peritoneal fluid collection in the left lower quadrant measuring 1.8 x 3.8 x 10.5 cm (approximate volume 37 mL). 4. Small dependent right pleural effusion, increased since prior examination. 5. Residual posterior loculated left pleural effusion measuring 2.1 x 6.1 x 5.0 cm (approximate volume 33 mL), with small adjacent free flowing pleural effusion. Electronically signed by: Dorethia Molt MD 04/10/2024 04:44 AM EST RP Workstation: [id number]     Anti-infectives: Anti-infectives (From admission, onward)    Start     Dose/Rate Route Frequency Ordered Stop   04/06/24 1300  metroNIDAZOLE  (FLAGYL ) IVPB 500 mg        500 mg 100 mL/hr over 60 Minutes Intravenous Every 12 hours 04/06/24 1217     04/03/24 1330  metroNIDAZOLE  (FLAGYL ) IVPB 500 mg        500 mg 100 mL/hr over 60 Minutes Intravenous On call to O.R. 04/03/24 1242 04/03/24 1507   03/30/24 2200  vancomycin  (VANCOREADY) IVPB 750 mg/150 mL  Status:  Discontinued  750 mg 150 mL/hr over 60 Minutes Intravenous Every 24 hours 03/29/24 1620 04/03/24 1310   03/30/24 0400  ceFEPIme  (MAXIPIME ) 2 g in sodium chloride  0.9 % 100 mL IVPB        2 g 200 mL/hr over 30 Minutes Intravenous Every 12 hours 03/29/24 1620     03/29/24 1530  vancomycin  (VANCOCIN ) IVPB 1000 mg/200 mL premix        1,000 mg 200 mL/hr over 60 Minutes Intravenous  Once 03/29/24 1517 03/30/24 0228   03/29/24 1530  ceFEPIme  (MAXIPIME ) 2 g in sodium chloride  0.9 % 100 mL IVPB        2 g 200 mL/hr over 30 Minutes  Intravenous  Once 03/29/24 1518 03/29/24 1724        Assessment/Plan POD 7, s/p ex lap with SBR, Dr. Teresa 11/14 -CT CAP reviewed.  2 large fluid collections in pelvis -will have IR see patient today for drain placement -DC NGT and allow CLD once we know IR plans -DC foley -mobilize as able, PT/OT eval -picc and TNA given poor nutrition and the likelihood that this patient will struggle with enteral intake as well moving forward -labs still pending today -spoke to Taron, patient's daughter, to give update at bedside  FEN - DC NGT, CLD pending IR plans/NPOx ice/TNA/IVFs per primary VTE - Lovenox  ID - Cefipime, flagyl , continue for now given CT findings. Foley - DC 11/21  HTN HLD h/o CVA Alzheimer's disease (although oriented currently) Stage III adenocarcinoma of lung GERD Hypothyroidism PCM - TNA    LOS: 12 days    Burnard FORBES Banter , [facility] Surgery 04/10/2024, 8:46 AM Please see Amion for pager number during day hours 7:00am-4:30pm or 7:00am -11:30am on weekends

## 2024-04-10 NOTE — Progress Notes (Signed)
 Progress Note   Patient: Angelica [name] FMW:[id number] DOB: 18-Oct-1940 DOA: 03/29/2024     12 DOS: the patient was seen and examined on 04/10/2024   Brief hospital course: 83yo with h/o GERD, HTN, HLD, IDA, asthma, stage 3b adenocarcinoma of the lung, hypothyroidism, CVA, Alzheimer's dementia, and hx of SBO due to adhesions in 2016 and 2021 who presented on 11/9 with weakness and breast swelling. She was noted to have leukocytosis with lactate of 2.1.  CXR with near-complete opacification of the left lung, differential includes large malignant pleural effusion, central hilar mass with obstruction, lobar atelectasis, and/or pneumonia.  CT A/P with large highly located left pleural effusion with compressive atelectasis throughout the left lung.  She was started on antibiotics, pulmonary consulted.  11/11 also found to have SBO, NG tube placed, surgery consulted.  Underwent lysis of adhesions with small bowel resection on 11/14.  On TPN.   Assessment & Plan Pleural effusion associated with pulmonary infection vs. recurrent malignancy and sepsis Presenting with weakness, cough  PCCM following-infection versus malignancy Blood culture no growth so far On room air Continue broad-spectrum antibiotics w/ vancomycin  and cefepime   Chest tube placed 11/12, removed 11/15 Fluid studies suggest possible infectious etiology due to elevated WBC count, elevated LDH, elevated protein fluid but culture negative to date Blood cultures negative Marked leukocytosis 11/20; see below re: abdomen but CT chest relatively unremarkable SBO (small bowel obstruction) (HCC) H/o prior SBO x 2 Patient complained of stomach upset and vomited 11/11; x-ray concerning for SBO  Surgery consulted NG tube inserted Repeat CT abdomen 11/11 small bowel obstruction with proximal transition point just beyond the ligament of Treitz Underwent ex lap with lysis of adhesion and small bowel resection on 11/14 Started on TPN Marked  leukocytosis 11/20 with reported significant abdominal pain CT showed interval partial SBO with resolution of previously noted SBO with oral contrast into the distal transverse colon Also showed loculated intraperitoneal abscess in LLQ 1.8 x 3.8 x 10.5 cm Surgery has placed an order for a CT-guided perc drain Weakness Likely secondary to above Fall precautions PT/OT recommending STR Adenocarcinoma of left lung, stage 3 (HCC) Diagnosed in 05/2020 S/p concurrent chemoradiation with weekly carboplatin  for AUC of 2 and paclitaxel  45 mg/M2. S/P 5 X -2022 S/p Consolidation treatment with immunotherapy with Imfinzi  1500 mg IV q 4 wk x 3 cycles - discontinued 2/2 intolerance/sepsis Currently on observation Pleural fluid cytology was negative from 11/12 HTN (hypertension) Continue amlodipine   PRN metoprolol Elevated troponin Flat and mildly elevated troponin 27>28 No chest pain or palpitations, no changes on EKG  Related to demand ischemia in the setting of sepsis HLD (hyperlipidemia) Continue high intensity statin  History of stroke Allergy to ASA On plavix  and high intensity statin PTA Per neuro note October 30 2023- She had an MRI scan of the brain done on 07/18/2021 which I personally reviewed and shows only mild changes of small vessel disease and generalized atrophy. I do not see a definite right cerebellar stroke but the radiologist has called 1 which apparently was not seen on previous MRI from 06/14/2020. Patient has since been started on Plavix  by Dr. Norleen   Neuro Dr Rosemarie did not feel it was CVA Plavix  is currently on hold Hypothyroidism Normal TSH, mildly elevated free T4 Continue synthroid  50mcg daily  Asthma, severe persistent, well-controlled (HCC) No signs of exacerbation  Continue bronchodilators Alzheimer's disease (HCC) At baseline Continue namenda  Delirium precautions  GERD (gastroesophageal reflux disease) Continue protonix  daily  Malnutrition  of moderate  degree Nutrition Problem: Moderate Malnutrition Etiology: chronic illness Signs/Symptoms: mild fat depletion, moderate muscle depletion Interventions: Refer to RD note for recommendations, TPN Hypokalemia K+ 2.4 this AM Repleted Recheck in AM Prediabetes A1c 5.9 Will cover with sensitive-scale SSI Sinus bradycardia RN noted periodic short episodes of bradycardia with sleep No intervention would be appropriate Will continue to monitor on telemetry Electrolyte abnormality Replete K+, Mag++, Phos as needed      Consultants: Pulmonology Surgery IR Palliative PT OT Nutrition TOC team   Procedures: Chest tube insertion 11/12 Ex lap, lysis of adhesions, small bowel resection 11/14 CT-guided perc drain placement 11/21   Antibiotics: Cefepime  11/9- Metronidazole  11/14- Vancomycin  11/9-14    30 Day Unplanned Readmission Risk Score    Flowsheet Row ED to Hosp-Admission (Current) from 03/29/2024 in [facility]-ICU/STEPDOWN  30 Day Unplanned Readmission Risk Score (%) 29.2 Filed at 04/10/2024 0801    This score is the patient's risk of an unplanned readmission within 30 days of being discharged (0 -100%). The score is based on dignosis, age, lab data, medications, orders, and past utilization.   Low:  0-14.9   Medium: 15-21.9   High: 22-29.9   Extreme: 30 and above           Subjective: More comfortable today.  Morphine  is helping, less somnolent so daughter is satisfied for now.  Awaiting drain placement.   Objective: Vitals:   04/10/24 1500 04/10/24 1600  BP: (!) 105/59 (!) 142/67  Pulse: 69 70  Resp: (!) 22 16  Temp:    SpO2: 99% 97%    Intake/Output Summary (Last 24 hours) at 04/10/2024 1707 Last data filed at 04/10/2024 1617 Gross per 24 hour  Intake 3146.55 ml  Output 1975 ml  Net 1171.55 ml   Filed Weights   04/03/24 1327 04/07/24 0400 04/09/24 0524  Weight: 59.6 kg 58.8 kg 62.7 kg    Exam:  General:  Appears calm and  comfortable and is in NAD, better than yesterday Eyes:  normal lids, iris ENT:  grossly normal hearing, lips & tongue, mmm Cardiovascular:  RRR. No LE edema.  Respiratory:   CTA bilaterally with no wheezes/rales/rhonchi.  Normal respiratory effort. Abdomen:  mild generalized TTP, midline surgical dressing is in place and C/D/I Skin:  no rash or induration seen on limited exam Musculoskeletal: generalized weakness, no bony abnormality Psychiatric:  pleasant mood and affect, speech sparse but appropriate Neurologic:  CN 2-12 grossly intact  Data Reviewed: I have reviewed the patient's lab results since admission.  Pertinent labs for today include:  Glucose 139 Phos 1.9 Mag 1.6      Family Communication: Daughter was present  Mobility: PT/OT Consulted and are recommending - Skilled Nursing-Short Term Rehab (<3 Hours/Day)04/09/2024 1453    Code Status: Full Code   Disposition: Status is: Inpatient Remains inpatient appropriate because: ongoing management     Time spent: 50 minutes  Unresulted Labs (From admission, onward)     Start     Ordered   04/13/24 0500  Triglycerides  ([facility] Lab Panel)  Every Monday (0500),   R     Question:  Specimen collection method  Answer:  Lab=Lab collect   04/07/24 1027   04/10/24 1706  Glucose, capillary  Once,   R        04/10/24 1706   04/10/24 1352  Aerobic/Anaerobic Culture w Gram Stain (surgical/deep wound)  Once,   R        04/10/24 1351  04/10/24 1352  Aerobic/Anaerobic Culture w Gram Stain (surgical/deep wound)  Once,   R        04/10/24 1352   04/10/24 0500  Basic metabolic panel with GFR  Daily,   R     Question:  Specimen collection method  Answer:  Unit=Unit collect   04/08/24 0815   04/10/24 0500  Magnesium   Daily,   R     Question:  Specimen collection method  Answer:  Unit=Unit collect   04/08/24 0815   04/10/24 0500  Phosphorus  Daily,   R     Question:  Specimen collection method  Answer:  Unit=Unit collect    04/08/24 0815   04/09/24 0500  Comprehensive metabolic panel  ([facility] Lab Panel)  Every Mon,Thu (0500),   R     Question:  Specimen collection method  Answer:  Lab=Lab collect   04/07/24 1027   04/09/24 0500  Magnesium   ([facility] Lab Panel)  Every Mon,Thu (0500),   R     Question:  Specimen collection method  Answer:  Lab=Lab collect   04/07/24 1027   04/09/24 0500  Phosphorus  ([facility] Lab Panel)  Every Mon,Thu (0500),   R     Question:  Specimen collection method  Answer:  Lab=Lab collect   04/07/24 1027             Author: Delon Herald, MD 04/10/2024 5:07 PM  For on call review [email or web address].

## 2024-04-10 NOTE — Assessment & Plan Note (Signed)
 Flat and mildly elevated troponin 27>28 No chest pain or palpitations, no changes on EKG  Related to demand ischemia in the setting of sepsis

## 2024-04-10 NOTE — Procedures (Signed)
 Pre procedural Dx: post op abscess x 2 Post procedural Dx: Same  Technically successful CT guided placed of a 10 Fr drainage catheter placement into the LUQ and LLQ (#2) abscess yielding heme and debris..    A representative aspirated sample was capped and sent to the laboratory for analysis.    EBL: Trace Complications: None immediate  Angelica Banner, MD Pager #: [phone]

## 2024-04-10 NOTE — Assessment & Plan Note (Signed)
 No signs of exacerbation  Continue bronchodilators

## 2024-04-10 NOTE — Assessment & Plan Note (Signed)
 Allergy to ASA On plavix  and high intensity statin PTA Per neuro note October 30 2023- She had an MRI scan of the brain done on 07/18/2021 which I personally reviewed and shows only mild changes of small vessel disease and generalized atrophy. I do not see a definite right cerebellar stroke but the radiologist has called 1 which apparently was not seen on previous MRI from 06/14/2020. Patient has since been started on Plavix  by Dr. Norleen   Neuro Dr Rosemarie did not feel it was CVA Plavix  is currently on hold

## 2024-04-10 NOTE — Assessment & Plan Note (Signed)
 H/o prior SBO x 2 Patient complained of stomach upset and vomited 11/11; x-ray concerning for SBO  Surgery consulted NG tube inserted Repeat CT abdomen 11/11 small bowel obstruction with proximal transition point just beyond the ligament of Treitz Underwent ex lap with lysis of adhesion and small bowel resection on 11/14 Started on TPN Marked leukocytosis 11/20 with reported significant abdominal pain CT showed interval partial SBO with resolution of previously noted SBO with oral contrast into the distal transverse colon Also showed loculated intraperitoneal abscess in LLQ 1.8 x 3.8 x 10.5 cm Surgery has placed an order for a CT-guided perc drain

## 2024-04-10 NOTE — Progress Notes (Signed)
 Referring Physician(s): Wilson,E  Supervising Physician: Jenna Hacker  Patient Status:  [facility] - In-pt  Chief Complaint: Post op abdominal fluid collections; referred for image guided aspiration/drainage of abd fluid collections   Subjective: Pt known to IR team from port a cath placement 2022, right thoracentesis 2024, port a cath removal 2024. She is an 83 yo female with PMH sig for GERD, HTN, HLD, IDA, asthma, stage 3b adenocarcinoma of the lung, hypothyroidism, CVA, Alzheimer's dementia, and hx of SBO due to adhesions in 2016 and 2021 who presented on 03/29/24 with weakness and breast swelling. She was noted to have leukocytosis with lactate of 2.1. CXR with near-complete opacification of the left lung, differential includes large malignant pleural effusion, central hilar mass with obstruction, lobar atelectasis, and/or pneumonia. CT A/P with large highly located left pleural effusion with compressive atelectasis throughout the left lung. She was started on antibiotics, pulmonary consulted. Had chest tube placed but since removed.  11/11 also found to have SBO, NG tube placed, surgery consulted. Underwent lysis of adhesions with small bowel resection on 11/14. Now with f/u CT C/A/P 11/20 revealing:   1. Interval partial small bowel resection with resolution of the previously noted small bowel obstruction. Oral contrast now extends into the distal transverse colon. 2. Loculated, rim enhancing intraperitoneal fluid collection adjacent to the anastomosis measuring 3.1 x 3.7 x 8.3 cm (approximate volume 49 mL), concerning for a postoperative abscess. 3. Second similar loculated, rim enhancing peritoneal fluid collection in the left lower quadrant measuring 1.8 x 3.8 x 10.5 cm (approximate volume 37 mL). 4. Small dependent right pleural effusion, increased since prior examination. 5. Residual posterior loculated left pleural effusion measuring 2.1 x 6.1 x 5.0 cm (approximate volume 33  mL), with small adjacent free flowing pleural effusion.   She is afebrile, WBC 34.8, hgb 9.8, plts nl, PT 16.1, INR 1.2, creat nl; request now received from [facility] for image guided asp/drainage of abd fluid collections. Pt does have lethargy but currently denies worsening abd pain,N/V   Past Medical History:  Diagnosis Date   Allergy 05/21/1978   Arthritis    Asthma    Bowel obstruction (HCC)    Cancer (HCC)    Cataract    Environmental allergies    GERD (gastroesophageal reflux disease)    Glaucoma 05/09/2017   H. pylori infection    History of radiation therapy 07/05/20-08/05/20   Left lung IMRT Dr. Shannon    HLD (hyperlipidemia) 01/16/2019   Hypertension    Iron  deficiency anemia    Osteopenia 05/26/2017   Thyroid  disease    Urticaria 07/25/2018   Past Surgical History:  Procedure Laterality Date   ABDOMINAL HYSTERECTOMY     BOWEL RESECTION N/A 05/23/2020   Procedure: SMALL BOWEL REPAIR;  Surgeon: Stevie, Herlene Righter, MD;  Location: [facility];  Service: General;  Laterality: N/A;   BREAST BIOPSY     BRONCHIAL BIOPSY  06/10/2020   Procedure: BRONCHIAL BIOPSIES;  Surgeon: Brenna Adine CROME, DO;  Location: [facility];  Service: Pulmonary;;   BRONCHIAL BRUSHINGS  06/10/2020   Procedure: BRONCHIAL BRUSHINGS;  Surgeon: Brenna Adine CROME, DO;  Location: [facility];  Service: Pulmonary;;   BRONCHIAL NEEDLE ASPIRATION BIOPSY  06/10/2020   Procedure: BRONCHIAL NEEDLE ASPIRATION BIOPSIES;  Surgeon: Brenna Adine CROME, DO;  Location: [facility];  Service: Pulmonary;;   BRONCHIAL WASHINGS  06/10/2020   Procedure: BRONCHIAL WASHINGS;  Surgeon: Brenna Adine CROME, DO;  Location: [facility];  Service: Pulmonary;;   CHEST TUBE  INSERTION Left 04/01/2024   Procedure: CHEST TUBE INSERTION;  Surgeon: Catherine Cools, MD;  Location: [facility];  Service: Pulmonary;  Laterality: Left;   COLON SURGERY     DIAGNOSTIC LARYNGOSCOPY N/A 05/23/2020   Procedure: ATTEMPTED DIAGNOSTIC LAPAROSCOPY  WITH ADHESIONS;  Surgeon: Kinsinger, Herlene Righter, MD;  Location: [facility];  Service: General;  Laterality: N/A;   EYE SURGERY     IR IMAGING GUIDED PORT INSERTION  07/15/2020   IR REMOVAL TUN ACCESS W/ PORT W/O FL MOD SED  01/16/2023   KNEE SURGERY     LAPAROTOMY N/A 04/18/2015   Procedure: Exploratory laparotomy with lysis of adhesions, possible bowel resection;  Surgeon: Lynda Leos, MD;  Location: [facility];  Service: General;  Laterality: N/A;   LAPAROTOMY N/A 05/23/2020   Procedure: EXPLORATORY LAPAROTOMY;  Surgeon: Stevie Herlene Righter, MD;  Location: [facility];  Service: General;  Laterality: N/A;   LAPAROTOMY N/A 04/03/2024   Procedure: EXPLORATORY LAPAROTOMY, LYSIS OF ADHESIONS, SMALL BOWEL RESECTION, SMALL BOWEL REPAIR;  Surgeon: Teresa Lonni HERO, MD;  Location: [facility];  Service: General;  Laterality: N/A;   LYSIS OF ADHESION N/A 05/23/2020   Procedure: LYSIS OF ADHESION;  Surgeon: Stevie Herlene Righter, MD;  Location: [facility];  Service: General;  Laterality: N/A;   NASAL SINUS SURGERY     SMALL INTESTINE SURGERY     THORACENTESIS Left 12/03/2022   Procedure: THORACENTESIS;  Surgeon: Brenna Adine CROME, DO;  Location: [facility];  Service: Pulmonary;  Laterality: Left;   THYROID  SURGERY     VIDEO BRONCHOSCOPY WITH ENDOBRONCHIAL NAVIGATION Bilateral 06/10/2020   Procedure: VIDEO BRONCHOSCOPY WITH ENDOBRONCHIAL NAVIGATION;  Surgeon: Brenna Adine CROME, DO;  Location: [facility];  Service: Pulmonary;  Laterality: Bilateral;   VIDEO BRONCHOSCOPY WITH ENDOBRONCHIAL ULTRASOUND  06/10/2020   Procedure: VIDEO BRONCHOSCOPY WITH ENDOBRONCHIAL ULTRASOUND;  Surgeon: Brenna Adine CROME, DO;  Location: [facility];  Service: Pulmonary;;     Allergies: Asa buff (mag [buffered aspirin], Ensure [nutritional supplements], Fish allergy, Other, Peanut-containing drug products, Penicillins, Shellfish protein-containing drug products, and Strawberry extract  Medications: Prior to Admission medications    Medication Sig Start Date End Date Taking? Authorizing Provider  acetaminophen  (TYLENOL ) 500 MG tablet Take 1,000 mg by mouth every 6 (six) hours as needed for moderate pain.   Yes [provider]  albuterol  (VENTOLIN  HFA) 108 (90 Base) MCG/ACT inhaler Inhale 2 puffs into the lungs every 4 (four) hours as needed for wheezing or shortness of breath. 01/03/24  Yes Padgett, Danita Macintosh, MD  alum & mag hydroxide-simeth (MAALOX/MYLANTA) 200-200-20 MG/5ML suspension Take 15 mLs by mouth every 6 (six) hours as needed for indigestion or heartburn.   Yes [provider]  amLODipine  (NORVASC ) 10 MG tablet Take 1 tablet (10 mg total) by mouth daily. 06/17/23  Yes Norleen Lynwood ORN, MD  benralizumab  (FASENRA ) 30 MG/ML prefilled syringe Inject 1 mL (30 mg total) into the skin every 8 (eight) weeks. 07/15/23  Yes Padgett, Danita Macintosh, MD  cetirizine  [facility] ALLERGY RELIEF, CETIRIZINE ,) 10 MG tablet Take 1 tablet (10 mg total) by mouth daily. 01/03/24  Yes Jeneal Danita Macintosh, MD  clopidogrel  (PLAVIX ) 75 MG tablet Take 1 tablet by mouth once daily 07/18/23  Yes Norleen Lynwood ORN, MD  diclofenac  Sodium (VOLTAREN ) 1 % GEL Apply 4 g topically 4 (four) times daily. Patient taking differently: Apply 4 g topically 4 (four) times daily as needed (pain). 04/23/22  Yes Corey, Evan S, MD  EPINEPHrine  (EPIPEN  2-PAK) 0.3 mg/0.3 mL IJ SOAJ  injection Use as directed for severe allergic reaction 01/03/24  Yes Padgett, Danita Macintosh, MD  fluticasone  (FLONASE ) 50 MCG/ACT nasal spray USE 1 SPRAY(S) IN EACH NOSTRIL IN THE MORNING AND AT BEDTIME Patient taking differently: Place 1 spray into both nostrils daily. 01/14/24  Yes Jeneal Danita Macintosh, MD  ipratropium (ATROVENT ) 0.06 % nasal spray Place 2 sprays into both nostrils 3 (three) times daily as needed for rhinitis. 01/03/24  Yes Jeneal Danita Macintosh, MD  levothyroxine  (SYNTHROID ) 50 MCG tablet TAKE 1 TABLET BY MOUTH ONCE DAILY BEFORE BREAKFAST 03/10/24   Yes Norleen Lynwood ORN, MD  memantine  (NAMENDA ) 10 MG tablet Take 1 tablet (10 mg total) by mouth 2 (two) times daily. 10/30/23  Yes McCue, Harlene, NP  montelukast  (SINGULAIR ) 10 MG tablet Take 1 tablet (10 mg total) by mouth at bedtime. 01/03/24  Yes Padgett, Danita Macintosh, MD  ondansetron  (ZOFRAN -ODT) 4 MG disintegrating tablet Take 1 tablet (4 mg total) by mouth every 8 (eight) hours as needed for nausea or vomiting. 07/09/22  Yes Merlynn Eland F, FNP  SYMBICORT  80-4.5 MCG/ACT inhaler INHALE 2 PUFFS BY MOUTH IN THE MORNING 01/03/24  Yes Padgett, Danita Macintosh, MD  gentamicin  ointment (GARAMYCIN ) 0.1 % Apply 1 Application topically daily. Apply to wound daily between toes.  Use a very small amount. Patient not taking: Reported on 03/10/2024 08/27/23   McCaughan, Dia D, DPM  Magnesium  Hydroxide (PHILLIPS MILK OF MAGNESIA PO) Take 15-30 mLs by mouth daily as needed (for constipation).    [provider]  pantoprazole  (PROTONIX ) 40 MG tablet Take 1 tablet by mouth once daily 03/31/24   Norleen Lynwood ORN, MD  potassium chloride  SA (KLOR-CON  M) 20 MEQ tablet Take 2 tablets (40 mEq total) by mouth daily as needed. Take potasium days you take lasix . Patient not taking: Reported on 03/10/2024 01/03/23   Norleen Lynwood ORN, MD  pravastatin  (PRAVACHOL ) 80 MG tablet Take 1 tablet by mouth once daily 04/09/24   Norleen Lynwood ORN, MD     Vital Signs: BP 129/79   Pulse 76   Temp (!) 97.4 F (36.3 C) (Axillary)   Resp (!) 24   Ht 5' 5 (1.651 m)   Wt 138 lb 3.7 oz (62.7 kg)   SpO2 100%   BMI 23.00 kg/m   Physical Exam: pt arousable but lethargic; chest- dim BS bases; heart- RRR; abd-soft, midline wound with bandage in place; few BS, NT; no LE edema  Imaging: CT CHEST ABDOMEN PELVIS W CONTRAST Result Date: 04/10/2024 EXAM: CT CHEST ABDOMEN PELVIS WITH THORACIC AND LUMBAR SPINE RECONSTRUCTIONS 04/10/2024 12:44:37 AM TECHNIQUE: CT of the chest, abdomen, pelvis was performed after the administration of 100 mL  of iohexol  (OMNIPAQUE ) 300 MG/ML solution. Multiplanar reformatted images are provided for review, including reconstructed images of the thoracic and lumbar spine. Automated exposure control, iterative reconstruction, and/or weight based adjustment of the mA/kV was utilized to reduce the radiation dose to as low as reasonably achievable. COMPARISON: 03/31/2024 and 03/29/2024 CLINICAL HISTORY: Leukocystosis, stage III lung cancer, recent pleural effusion with chest tube, recent ex lap with SBR for SBO. *tracking code: Bo* FINDINGS: CT CHEST: THORACIC AORTA: Mild atherosclerotic calcification within the thoracic aorta. No aortic aneurysm. No acute traumatic injury of the aorta. MEDIASTINUM: Extensive multivessel coronary artery calcification. Global cardiac size within normal limits. No pericardial effusion. Right upper extremity PICC line tip seen within the superior vena cava. 3.8 cm left thyroid  nodule, not well characterized on this examination. Nasogastric tube tip seen within the distal  body of the stomach. Esophagus unremarkable. No pathologic thoracic adenopathy. No mediastinal hematoma or pneumomediastinum. No acute traumatic injury to the heart or pericardium. The central airways are clear. LUNGS: Small dependent right pleural effusion has increased since prior examination. Loculated left pleural effusion noted on prior examination has been largely evacuated though a residual posterior collection is seen measuring 2.1 x 6.1 x 5.0 cm (volume 33.4 cm3). Small adjacent free flowing pleural effusion is present. Subtotal collapse of the left lower lobe. Band-like atelectasis within the left upper lobe. No superimposed confluent pulmonary infiltrate. Compressive atelectasis of the right lower lobe. No pneumothorax. No central obstructing lesion. No acute traumatic injury to the lungs. No pulmonary contusion or laceration. CHEST WALL: No acute displaced rib fracture. No chest wall hematoma. CT ABDOMEN AND PELVIS:  ABDOMINAL AORTA: Mild aortoiliac atherosclerotic calcification. No acute traumatic injury of the aorta or iliac arteries. HEPATOBILIARY: No acute traumatic injury. SPLEEN: No acute traumatic injury. PANCREAS: No acute traumatic injury. ADRENAL GLANDS: No acute traumatic injury. KIDNEYS: No acute traumatic injury. No hydronephrosis. GI TRACT: Interval partial small bowel resection with resolution of the previously noted small bowel obstruction. Oral contrast now extends into the distal transverse colon. Adjacent to the anastomosis is a loculated, rim enhancing intraperitoneal fluid collection measuring 3.1 x 3.7 x 8.3 cm (volume 49.7 cm3) concerning for a postoperative abscess. Second similar loculated, rim enhancing peritoneal fluid collection seen anteriorly within the left lower quadrant measuring 1.8 x 3.8 x 10.5 cm (volume 37.3 cm3). PERITONEUM: Loculated, rim enhancing intraperitoneal fluid collections as described in the GI tract section. No free intraperitoneal gas. RETROPERITONEUM: No retroperitoneal hematoma. BLADDER: Foley catheter balloon seen within the incompletely decompressed bladder lumen. REPRODUCTIVE ORGANS: Uterus absent. No adnexal masses. BONES: Osseous structures are age appropriate. No acute bone abnormality. No lytic or blastic bone lesion. No acute traumatic fracture of the pelvis. THORACIC AND LUMBAR SPINE: BONES AND ALIGNMENT: No traumatic fracture or traumatic malalignment. DEGENERATIVE CHANGES: No severe spinal canal stenosis or bony neural foraminal narrowing. SOFT TISSUES: No paraspinal mass or hematoma. IMPRESSION: 1. Interval partial small bowel resection with resolution of the previously noted small bowel obstruction. Oral contrast now extends into the distal transverse colon. 2. Loculated, rim enhancing intraperitoneal fluid collection adjacent to the anastomosis measuring 3.1 x 3.7 x 8.3 cm (approximate volume 49 mL), concerning for a postoperative abscess. 3. Second similar  loculated, rim enhancing peritoneal fluid collection in the left lower quadrant measuring 1.8 x 3.8 x 10.5 cm (approximate volume 37 mL). 4. Small dependent right pleural effusion, increased since prior examination. 5. Residual posterior loculated left pleural effusion measuring 2.1 x 6.1 x 5.0 cm (approximate volume 33 mL), with small adjacent free flowing pleural effusion. Electronically signed by: Dorethia Molt MD 04/10/2024 04:44 AM EST RP Workstation: [id number]   US  EKG SITE RITE Result Date: 04/07/2024 If Site Rite image not attached, placement could not be confirmed due to current cardiac rhythm.   Labs:  CBC: Recent Labs    04/06/24 0853 04/07/24 0200 04/08/24 0425 04/09/24 1039  WBC 32.6* 29.6* 19.8* 38.1*  HGB 12.1 12.1 10.7* 11.3*  HCT 37.8 37.3 32.5* 33.7*  PLT 315 347 274 249    COAGS: Recent Labs    03/29/24 1835  INR 1.2    BMP: Recent Labs    04/06/24 0853 04/07/24 0200 04/08/24 0425 04/08/24 2140 04/09/24 1039  NA 145 143 135  --  132*  K 3.9 3.4* 2.4* 3.7 3.6  CL 114* 111 103  --  104  CO2 18* 23 25  --  19*  GLUCOSE 103* 144* 197*  --  154*  BUN 39* 25* 13  --  16  CALCIUM  9.3 9.3 8.4*  --  8.1*  CREATININE 0.79 0.69 0.50  --  0.57  GFRNONAA >60 >60 >60  --  >60    LIVER FUNCTION TESTS: Recent Labs    03/29/24 0926 04/06/24 0853 04/08/24 0425 04/09/24 1039  BILITOT 0.7 0.3 0.2 0.5  AST 19 21 15 17   ALT 10 6 5  <5  ALKPHOS 77 86 59 58  PROT 7.4 6.3* 5.8* 5.9*  ALBUMIN  3.2* 2.3* 2.1* 2.1*    Assessment and Plan: 83 yo female with PMH sig for GERD, HTN, HLD, IDA, asthma, stage 3b adenocarcinoma of the lung, hypothyroidism, CVA, Alzheimer's dementia, and hx of SBO due to adhesions in 2016 and 2021 who presented on 03/29/24 with weakness and breast swelling. She was noted to have leukocytosis with lactate of 2.1. CXR with near-complete opacification of the left lung, differential includes large malignant pleural effusion, central hilar mass  with obstruction, lobar atelectasis, and/or pneumonia. CT A/P with large highly located left pleural effusion with compressive atelectasis throughout the left lung. She was started on antibiotics, pulmonary consulted. Had chest tube placed but since removed.  11/11 also found to have SBO, NG tube placed, surgery consulted. Underwent lysis of adhesions with small bowel resection on 11/14. Now with f/u CT C/A/P 11/20 revealing:   1. Interval partial small bowel resection with resolution of the previously noted small bowel obstruction. Oral contrast now extends into the distal transverse colon. 2. Loculated, rim enhancing intraperitoneal fluid collection adjacent to the anastomosis measuring 3.1 x 3.7 x 8.3 cm (approximate volume 49 mL), concerning for a postoperative abscess. 3. Second similar loculated, rim enhancing peritoneal fluid collection in the left lower quadrant measuring 1.8 x 3.8 x 10.5 cm (approximate volume 37 mL). 4. Small dependent right pleural effusion, increased since prior examination. 5. Residual posterior loculated left pleural effusion measuring 2.1 x 6.1 x 5.0 cm (approximate volume 33 mL), with small adjacent free flowing pleural effusion.   She is afebrile, WBC 34.8, hgb 9.8, plts nl, PT 16.1, INR 1.2, creat nl; request now received from [facility] for image guided asp/drainage of abd fluid collections.  Pt known to IR team from port a cath placement 2022, right thoracentesis 2024, port a cath removal 2024.   Latest imaging studies were reviewed by Dr. Jenna. Risks and benefits discussed with the patient/daughter  Larry Lot including bleeding, infection, damage to adjacent structures, bowel perforation/fistula connection, and sepsis.  All of the patient's questions were answered, patient is agreeable to proceed. Consent signed and in chart.  Procedure tent planned for today   Electronically Signed: D. Franky Rakers, PA-C 04/10/2024, 10:24 AM   I spent a total of  30 minutes at the the patient's bedside AND on the patient's hospital floor or unit, greater than 50% of which was counseling/coordinating care for image guided aspiration/drainage of abdominal fluid collections    Patient ID: Angelica [name], female   DOB: 11-Apr-1941, 83 y.o.   MRN: [id number]

## 2024-04-10 NOTE — Assessment & Plan Note (Signed)
 Normal TSH, mildly elevated free T4 Continue synthroid  50mcg daily

## 2024-04-10 NOTE — Assessment & Plan Note (Addendum)
 Nutrition Problem: Moderate Malnutrition Etiology: chronic illness Signs/Symptoms: mild fat depletion, moderate muscle depletion Interventions: Refer to RD note for recommendations, TPN

## 2024-04-10 NOTE — Progress Notes (Signed)
 Nutrition Follow-up  DOCUMENTATION CODES:   Non-severe (moderate) malnutrition in context of chronic illness  INTERVENTION:  - TPN to continue tonight at 43mL/hr due to ongoing refeeding syndrome.              - TPN management per pharmacy.   -  Per pharmacy, goal TPN rate is 69mL/hr.   - Monitor magnesium , potassium, and phosphorus daily for at least 3 days, MD to replete as needed, as pt is at risk for refeeding syndrome given NPO x7 days. - Continue 100mg  thiamine  due to refeeding.   - Daily weights while on TPN.    NUTRITION DIAGNOSIS:   Moderate Malnutrition related to chronic illness as evidenced by mild fat depletion, moderate muscle depletion. *ongoing  GOAL:   Patient will meet greater than or equal to 90% of their needs *progressing, on TPN  MONITOR:   Diet advancement, Labs, Weight trends  REASON FOR ASSESSMENT:   Other (Comment) (New TPN)    ASSESSMENT:   83 y.o. 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 Admitted for PE and SBO.  11/9 Admit 11/11 NGT placed 11/18 TPN initiated 11/21 NGT removed  Patient in bed at time of visit, no family or visitors at bedside. Patient much more alert today than during last visit. Endorses feeling a little better, especially as NGT was removed this morning per Surgery.    Patient has remained on TPN at 15mL/hr due to ongoing refeeding issues. Phosphorus has continued to remain low despite repletion.   Pharmacy planning to leave patient at 95ml/hr again tonight. Can hopefully advance tomorrow if labs more stable. Patient continues to be unable to receive lipids due to fish/shellfish and peanut allergies. Per discussion with RN, possible plan to advance oral diet soon if patient able to tolerate  NGT out and has a BM.   Admit weight: 131# Current weight: 138# I&O's: +5.6L since admit  Medications reviewed and include: Protonix    Labs reviewed:   Latest Reference Range & Units 04/07/24 02:00 04/08/24 04:25 04/09/24 10:39 04/10/24 10:23  Potassium 3.5 - 5.1 mmol/L 3.4 (L) 2.4 (LL) 3.6 3.7  Phosphorus 2.5 - 4.6 mg/dL  1.4 (L) 1.7 (L) 1.9 (L)  Magnesium  1.7 - 2.4 mg/dL 2.1 1.5 (L) 2.1 1.6 (L)  (LL): Data is critically low (L): Data is abnormally low (H): Data is abnormally high  HA1C 5.8  Diet Order:   Diet Order             Diet NPO time specified Except for: Sips with Meds  Diet effective now                   EDUCATION NEEDS:  Education needs have been addressed  Skin:  Skin Assessment: Skin Integrity Issues: Skin Integrity Issues:: Incisions Incisions: Surgical on abdomen  Last BM:  11/13  Height:  Ht Readings from Last 1 Encounters:  04/03/24 5' 5 (1.651 m)   Weight:  Wt Readings from Last 1 Encounters:  04/09/24 62.7 kg   Ideal Body Weight:  56.82 kg  BMI:  Body mass index is 23 kg/m.  Estimated Nutritional Needs:  Kcal:  1450-1600 kcals Protein:  70-85 grams Fluid:  >/= 1.5L    Trude Ned RD, LDN Contact via Secure Chat.

## 2024-04-11 DIAGNOSIS — J189 Pneumonia, unspecified organism: Secondary | ICD-10-CM | POA: Diagnosis not present

## 2024-04-11 DIAGNOSIS — J918 Pleural effusion in other conditions classified elsewhere: Secondary | ICD-10-CM | POA: Diagnosis not present

## 2024-04-11 LAB — CULTURE, BLOOD (ROUTINE X 2)
Culture: NO GROWTH
Culture: NO GROWTH

## 2024-04-11 LAB — BASIC METABOLIC PANEL WITH GFR
Anion gap: 6 (ref 5–15)
BUN: 17 mg/dL (ref 8–23)
CO2: 23 mmol/L (ref 22–32)
Calcium: 8 mg/dL — ABNORMAL LOW (ref 8.9–10.3)
Chloride: 106 mmol/L (ref 98–111)
Creatinine, Ser: 0.49 mg/dL (ref 0.44–1.00)
GFR, Estimated: >60 mL/min (ref >60)
Glucose, Bld: 150 mg/dL — ABNORMAL HIGH (ref 70–99)
Potassium: 3.9 mmol/L (ref 3.5–5.1)
Sodium: 135 mmol/L (ref 135–145)

## 2024-04-11 LAB — GLUCOSE, CAPILLARY
Glucose-Capillary: 103 mg/dL — ABNORMAL HIGH (ref 70–99)
Glucose-Capillary: 118 mg/dL — ABNORMAL HIGH (ref 70–99)
Glucose-Capillary: 155 mg/dL — ABNORMAL HIGH (ref 70–99)

## 2024-04-11 LAB — PHOSPHORUS: Phosphorus: 2.2 mg/dL — ABNORMAL LOW (ref 2.5–4.6)

## 2024-04-11 LAB — MAGNESIUM: Magnesium: 2.1 mg/dL (ref 1.7–2.4)

## 2024-04-11 MED ORDER — POTASSIUM PHOSPHATES 15 MMOLE/5ML IV SOLN
20.0000 mmol | Freq: Once | INTRAVENOUS | Status: AC
  Administered 2024-04-11: 20 mmol via INTRAVENOUS
  Filled 2024-04-11: qty 6.67

## 2024-04-11 MED ORDER — SODIUM CHLORIDE 0.9 % IV SOLN: INTRAVENOUS | Status: AC

## 2024-04-11 MED ORDER — TRAVASOL 10 % IV SOLN
INTRAVENOUS | Status: AC
  Filled 2024-04-11: qty 636

## 2024-04-11 NOTE — Evaluation (Signed)
 Clinical/Bedside Swallow Evaluation Patient Details  Name: Angelica [name] MRN: [id number] Date of Birth: [birth year]  Today's Date: 04/11/2024 Time: SLP Start Time (ACUTE ONLY): 9072 SLP Stop Time (ACUTE ONLY): 0945 SLP Time Calculation (min) (ACUTE ONLY): 18 min  Past Medical History:  Past Medical History:  Diagnosis Date   Allergy 05/21/1978   Arthritis    Asthma    Bowel obstruction (HCC)    Cancer (HCC)    Cataract    Environmental allergies    GERD (gastroesophageal reflux disease)    Glaucoma 05/09/2017   H. pylori infection    History of radiation therapy 07/05/20-08/05/20   Left lung IMRT Dr. Shannon    HLD (hyperlipidemia) 01/16/2019   Hypertension    Iron  deficiency anemia    Osteopenia 05/26/2017   Thyroid  disease    Urticaria 07/25/2018   Past Surgical History:  Past Surgical History:  Procedure Laterality Date   ABDOMINAL HYSTERECTOMY     BOWEL RESECTION N/A 05/23/2020   Procedure: SMALL BOWEL REPAIR;  Surgeon: Stevie, Herlene Righter, MD;  Location: [facility];  Service: General;  Laterality: N/A;   BREAST BIOPSY     BRONCHIAL BIOPSY  06/10/2020   Procedure: BRONCHIAL BIOPSIES;  Surgeon: Brenna Adine CROME, DO;  Location: [facility];  Service: Pulmonary;;   BRONCHIAL BRUSHINGS  06/10/2020   Procedure: BRONCHIAL BRUSHINGS;  Surgeon: Brenna Adine CROME, DO;  Location: [facility];  Service: Pulmonary;;   BRONCHIAL NEEDLE ASPIRATION BIOPSY  06/10/2020   Procedure: BRONCHIAL NEEDLE ASPIRATION BIOPSIES;  Surgeon: Brenna Adine CROME, DO;  Location: [facility];  Service: Pulmonary;;   BRONCHIAL WASHINGS  06/10/2020   Procedure: BRONCHIAL WASHINGS;  Surgeon: Brenna Adine CROME, DO;  Location: [facility];  Service: Pulmonary;;   CHEST TUBE INSERTION Left 04/01/2024   Procedure: CHEST TUBE INSERTION;  Surgeon: Catherine Cools, MD;  Location: [facility];  Service: Pulmonary;  Laterality: Left;   COLON SURGERY     DIAGNOSTIC LARYNGOSCOPY N/A 05/23/2020   Procedure:  ATTEMPTED DIAGNOSTIC LAPAROSCOPY WITH ADHESIONS;  Surgeon: Kinsinger, Herlene Righter, MD;  Location: [facility];  Service: General;  Laterality: N/A;   EYE SURGERY     IR IMAGING GUIDED PORT INSERTION  07/15/2020   IR REMOVAL TUN ACCESS W/ PORT W/O FL MOD SED  01/16/2023   KNEE SURGERY     LAPAROTOMY N/A 04/18/2015   Procedure: Exploratory laparotomy with lysis of adhesions, possible bowel resection;  Surgeon: Lynda Leos, MD;  Location: [facility];  Service: General;  Laterality: N/A;   LAPAROTOMY N/A 05/23/2020   Procedure: EXPLORATORY LAPAROTOMY;  Surgeon: Stevie Herlene Righter, MD;  Location: [facility];  Service: General;  Laterality: N/A;   LAPAROTOMY N/A 04/03/2024   Procedure: EXPLORATORY LAPAROTOMY, LYSIS OF ADHESIONS, SMALL BOWEL RESECTION, SMALL BOWEL REPAIR;  Surgeon: Teresa Lonni HERO, MD;  Location: [facility];  Service: General;  Laterality: N/A;   LYSIS OF ADHESION N/A 05/23/2020   Procedure: LYSIS OF ADHESION;  Surgeon: Stevie Herlene Righter, MD;  Location: [facility];  Service: General;  Laterality: N/A;   NASAL SINUS SURGERY     SMALL INTESTINE SURGERY     THORACENTESIS Left 12/03/2022   Procedure: THORACENTESIS;  Surgeon: Brenna Adine CROME, DO;  Location: [facility];  Service: Pulmonary;  Laterality: Left;   THYROID  SURGERY     VIDEO BRONCHOSCOPY WITH ENDOBRONCHIAL NAVIGATION Bilateral 06/10/2020   Procedure: VIDEO BRONCHOSCOPY WITH ENDOBRONCHIAL NAVIGATION;  Surgeon: Brenna Adine CROME, DO;  [facility]: [facility];  Service: Pulmonary;  Laterality: Bilateral;   VIDEO  BRONCHOSCOPY WITH ENDOBRONCHIAL ULTRASOUND  06/10/2020   Procedure: VIDEO BRONCHOSCOPY WITH ENDOBRONCHIAL ULTRASOUND;  Surgeon: Brenna Adine CROME, DO;  Location: [facility];  Service: Pulmonary;;   HPI:  Pt is a 83 y.o. female presented to ED with complaints of weakness; admitted 11/9 with large loculated left pleural effusion. 03/31/2024 diagnosed with SBO, s/p ex lap  11/14 ,placed NGT 04/01/24 s/p pigtail chest tube placed.  CXR (11/16) revealed, Small left pleural effusion.  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    Assessment / Plan / Recommendation  Clinical Impression  Limited bedside swallow evaluation performed this am given recent SBO and note/diet order from surgery (11/21) indicating NPO with allowance of ice chips and sips of clears if does ok with nursing bedside swallow assessment. Awaiting clarification on diet advancement from MD. Pt lethargic but able to maintain adequate alertness throughout evaluation. Oral motor exam WFL. Pt has several upper/lower missing dentition and daughter, at bedside, reports upper/lower dentures are at home. Ice chips x3 and single sips of thin via straw were without s/sx of aspiration. Belching occured intermittently and this is likely related to h/o GERD. Attempted to challenge pt with consecutive sipping, however pt unable to follow this command likely due to baseline cognitive deficits. Daughter reports no difficultes swallowing at baseline with pt consuming a soft-regular diet.       PLAN: Recommend continue NPO with ice chips/sips of clears from floor stock per surgery. She would be ok for full clear liquid diet when surgery deems appropriate. Will f/u for further evaluation with solid POs when medically able, to ensure pt consuming safest and least restrictive diet.  SLP Visit Diagnosis: Dysphagia, unspecified (R13.10)    Aspiration Risk       Diet Recommendation NPO;Ice chips PRN after oral care (sips of thin clear liquids from floor)    Liquid Administration via: Cup;Straw Medication Administration: Whole meds with liquid Supervision: Staff to assist with self feeding;Full supervision/cueing for compensatory strategies Compensations: Minimize environmental distractions;Slow rate;Small sips/bites Postural Changes: Seated upright at 90 degrees;Remain upright for  at least 30 minutes after po intake    Other  Recommendations Oral Care Recommendations: Oral care QID;Oral care prior to ice chip/H20     Assistance Recommended at Discharge    Functional Status Assessment Patient has had a recent decline in their functional status and demonstrates the ability to make significant improvements in function in a reasonable and predictable amount of time.  Frequency and Duration min 2x/week  2 weeks       Prognosis Prognosis for improved oropharyngeal function: Good Barriers to Reach Goals: Cognitive deficits      Swallow Study   General Date of Onset: 04/11/24 HPI: Pt is a 83 y.o. female presented to ED with complaints of weakness; admitted 11/9 with large loculated left pleural effusion. 03/31/2024 diagnosed with SBO, s/p ex lap  11/14 ,placed NGT 04/01/24 s/p pigtail chest tube placed. CXR (11/16) revealed, Small left pleural effusion.  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 Type of Study: Bedside Swallow Evaluation Previous Swallow Assessment: none per EMR Diet Prior to this Study: NPO (except ice chips) Temperature Spikes Noted: No Respiratory Status: Room air History of Recent Intubation: No Behavior/Cognition: Lethargic/Drowsy;Confused;Pleasant mood;Cooperative Oral Cavity Assessment: Within Functional Limits Oral Care Completed by SLP: No Oral Cavity -  Dentition: Missing dentition;Dentures, not available Vision: Functional for self-feeding Self-Feeding Abilities: Needs assist Patient Positioning: Upright in bed;Postural control adequate for testing Baseline Vocal Quality: Normal Volitional Cough: Strong Volitional Swallow: Able to elicit    Oral/Motor/Sensory Function Overall Oral Motor/Sensory Function: Within functional limits   Ice Chips Ice chips: Within functional limits Presentation: Spoon   Thin Liquid Thin Liquid: Within  functional limits Presentation: Straw    Nectar Thick Nectar Thick Liquid: Not tested   Honey Thick Honey Thick Liquid: Not tested   Puree Puree: Not tested   Solid     Solid: Not tested       Wilder Kin, MA, CCC-SLP Acute Rehabilitation Services Office Number: [phone]  Wilder KANDICE Kin 04/11/2024,10:12 AM

## 2024-04-11 NOTE — Assessment & Plan Note (Addendum)
 H/o prior SBO x 2 Patient complained of stomach upset and vomited 11/11; x-ray concerning for SBO  Surgery consulted NG tube inserted Repeat CT abdomen 11/11 small bowel obstruction with proximal transition point just beyond the ligament of Treitz Underwent ex lap with lysis of adhesion and small bowel resection on 11/14 Started on TPN Marked leukocytosis 11/20 with reported significant abdominal pain CT showed interval partial SBO with resolution of previously noted SBO with oral contrast into the distal transverse colon Also showed loculated intraperitoneal abscess in LLQ 1.8 x 3.8 x 10.5 cm IR placed a CT-guided perc drain on 11/21; cultures NTD Advanced to CLD on 11/22

## 2024-04-11 NOTE — Assessment & Plan Note (Signed)
 Continue amlodipine   PRN metoprolol

## 2024-04-11 NOTE — Assessment & Plan Note (Addendum)
 Presenting with weakness, cough  PCCM was consulting, now signed off Blood culture negative On room air Continue broad-spectrum antibiotics w/ vancomycin  and cefepime   Chest tube placed 11/12, removed 11/15 Fluid studies suggest possible infectious etiology due to elevated WBC count, elevated LDH, elevated protein fluid but culture negative Marked leukocytosis 11/20; see below re: abdomen but CT chest relatively unremarkable

## 2024-04-11 NOTE — Assessment & Plan Note (Signed)
 A1c 5.9 Will cover with sensitive-scale SSI

## 2024-04-11 NOTE — Assessment & Plan Note (Signed)
 Likely secondary to above Fall precautions PT/OT recommending STR

## 2024-04-11 NOTE — Progress Notes (Signed)
 PHARMACY - TOTAL PARENTERAL NUTRITION CONSULT NOTE   Indication: Prolonged ileus  Patient Measurements: Height: 5' 5 (165.1 cm) Weight: 62.7 kg (138 lb 3.7 oz) IBW/kg (Calculated) : 57 TPN AdjBW (KG): 59.6 Body mass index is 23 kg/m.  Assessment: Presented with abdominal pain and weakness - 11/14: Ex lap with LOA, SBR and repair Prolonged post-op ileus.  Glucose / Insulin : No h/o DM. A1c 5.9% -BG goal <180. BG range: 84 - 150 (4 units SSI/24 hrs) Electrolytes: Phos (2.2) slightly low but trending up/improving -All other electrolytes WNL, including CorrCa (9.5) -Goal electrolytes with ileus: K >/= 4, Mg >/=2 Renal: Scr <1, BUN WNL and stable Hepatic: LFTs, Alk Phos, T.bili all WNL. Albumin  low Intake / Output; MIVF:  -mIVF: NS @ 40 mL/hr -UOP: 1650 mL, drains: 45 mL GI Imaging: 11/11 CT: SBO 11/13: AbdXR: SBO 11/14: AbdXR: SBO  GI Surgeries / Procedures:  -11/14: Ex lap with LOA, SBR and repair -11/21: IR placement of drains for abdominal abscesses  Central access: Double lumen PICC placed 11/18 TPN start date:  04/07/24  Nutritional Goals: Goal TPN rate is 65 mL/hr (provides 83 g of protein and 1446 kcals per day)  RD Assessment: Estimated Needs Total Energy Estimated Needs: 1450-1600 kcals Total Protein Estimated Needs: 70-85 grams Total Fluid Estimated Needs: >/= 1.5L  Current Nutrition:  NPO except sips with meds TPN  Lipid recommendations in TPN: - Peanut allergy: Use SMOFlipid with potential cross reactivity risk with soybean - Fish Allergy: (non-anaphylactic): Use Intralipid  - Shellfish Allergy (non-anaphylactic): If fish allergy confirmed or the patient avoids all seafood, Use Intralipid   - For the short-term (< 7-10 days), benefits/risks can be discussed with patient or intravenous fat emulsions can be held    Plan:  Now:  -K Phos  20 mmol IV once  At 18:00: Increase rate of TPN to 50 mL/hr  No lipids due to allergies Will provide 1111 kcal, 64  g protein, 252 g dextrose  Electrolytes in TPN: Increase Na, will supplement electrolytes outside of TPN until more stable Na 150 mEq/L, K 50 mEq/L, Ca 5 mEq/L, Mg 5 mEq/L, and Phos 15 mmol/L.  Cl:Ac 1:2 Add standard MVI and trace elements to TPN Continue thiamine  100 mg IV daily for 6 days Continue sensitive SSI q6h and adjust as needed  Decrease mIVF to 20 mL/hr Monitor TPN labs on Mon/Thurs. Check electrolytes daily through the weekend.   Ronal CHRISTELLA Rav, PharmD, BCPS Clinical Pharmacist 04/11/2024 7:19 AM

## 2024-04-11 NOTE — Progress Notes (Signed)
 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 She was noted to have leukocytosis with lactate of 2.1.  CXR with near-complete opacification of the left lung, differential includes large malignant pleural effusion, central hilar mass with obstruction, lobar atelectasis, and/or pneumonia.  CT A/P with large highly located left pleural effusion with compressive atelectasis throughout the left lung.  She was started on antibiotics, pulmonary consulted.  11/11 also found to have SBO, NG tube placed, surgery consulted.  Underwent lysis of adhesions with small bowel resection on 11/14.  On TPN.    Assessment & Plan Pleural effusion associated with pulmonary infection vs. recurrent malignancy and sepsis Presenting with weakness, cough  PCCM was consulting, now signed off Blood culture negative On room air Continue broad-spectrum antibiotics w/ vancomycin  and cefepime   Chest tube placed 11/12, removed 11/15 Fluid studies suggest possible infectious etiology due to elevated WBC count, elevated LDH, elevated protein fluid but culture negative Marked leukocytosis 11/20; see below re: abdomen but CT chest relatively unremarkable SBO (small bowel obstruction) (HCC) H/o prior SBO x 2 Patient complained of stomach upset and vomited 11/11; x-ray concerning for SBO  Surgery consulted NG tube inserted Repeat CT abdomen 11/11 small bowel obstruction with proximal transition point just beyond the ligament of Treitz Underwent ex lap with lysis of adhesion and small bowel resection on 11/14 Started on TPN Marked leukocytosis 11/20 with reported significant  abdominal pain CT showed interval partial SBO with resolution of previously noted SBO with oral contrast into the distal transverse colon Also showed loculated intraperitoneal abscess in LLQ 1.8 x 3.8 x 10.5 cm IR placed a CT-guided perc drain on 11/21; cultures NTD Advanced to CLD on 11/22 Weakness Likely secondary to above Fall precautions PT/OT recommending STR Adenocarcinoma of left lung, stage 3 (HCC) Diagnosed in 05/2020 S/p concurrent chemoradiation with weekly carboplatin  for AUC of 2 and paclitaxel  45 mg/M2. S/P 5 X -2022 S/p Consolidation treatment with immunotherapy with Imfinzi  1500 mg IV q 4 wk x 3 cycles - discontinued 2/2 intolerance/sepsis Currently on observation Pleural fluid cytology was negative from 11/12 HTN (hypertension) Continue amlodipine   PRN metoprolol Elevated troponin Flat and mildly elevated troponin 27>28 No chest pain or palpitations, no changes on EKG  Related to demand ischemia in the setting of sepsis HLD (hyperlipidemia) Continue high intensity statin  History of stroke Allergy to ASA On plavix  and high intensity statin PTA Per neuro note October 30 2023- She had an MRI scan of the brain done on 07/18/2021 which I personally reviewed and shows only mild changes of small vessel disease and generalized atrophy. I do not see a definite right cerebellar stroke but the radiologist has called 1 which apparently was not seen on previous MRI from 06/14/2020. Patient has since been started on Plavix  by Dr. Norleen   Neuro Dr Rosemarie did not feel it was CVA Plavix  is currently on hold Hypothyroidism Normal TSH, mildly elevated free T4 Continue synthroid  50mcg daily  Asthma, severe persistent, well-controlled (HCC) No signs of exacerbation  Continue bronchodilators Alzheimer's disease (HCC) At baseline Continue namenda  Delirium precautions  GERD (gastroesophageal reflux disease) Continue protonix  daily  Malnutrition  of moderate degree Nutrition Problem:  Moderate Malnutrition Etiology: chronic illness Signs/Symptoms: mild fat depletion, moderate muscle depletion Interventions: Refer to RD note for recommendations, TPN Hypokalemia K+ 2.4 this AM Repleted Recheck in AM Prediabetes A1c 5.9 Will cover with sensitive-scale SSI Sinus bradycardia RN noted periodic short episodes of bradycardia with sleep No intervention would be appropriate Will continue to monitor on telemetry Electrolyte abnormality Replete K+, Mag++, Phos as needed      Consultants: Pulmonology Surgery IR Palliative PT OT Dietician TOC team   Procedures: Chest tube insertion 11/12 Ex lap, lysis of adhesions, small bowel resection 11/14 CT-guided perc drain placement 11/21   Antibiotics: Cefepime  11/9- Metronidazole  11/14- Vancomycin  11/9-14  30 Day Unplanned Readmission Risk Score    Flowsheet Row ED to Hosp-Admission (Current) from 03/29/2024 in [facility]  30 Day Unplanned Readmission Risk Score (%) 29.77 Filed at 04/11/2024 0400    This score is the patient's risk of an unplanned readmission within 30 days of being discharged (0 -100%). The score is based on dignosis, age, lab data, medications, orders, and past utilization.   Low:  0-14.9   Medium: 15-21.9   High: 22-29.9   Extreme: 30 and above           Subjective: Appeared healthier this AM - more alert and interactive.  No specific concerns.   Objective: Vitals:   04/11/24 0922 04/11/24 1431  BP: 135/77 (!) 161/92  Pulse: 77 (!) 106  Resp: 17 20  Temp: 97.8 F (36.6 C) 98 F (36.7 C)  SpO2:  100%    Intake/Output Summary (Last 24 hours) at 04/11/2024 1517 Last data filed at 04/11/2024 1408 Gross per 24 hour  Intake 1558.43 ml  Output 1120 ml  Net 438.43 ml   Filed Weights   04/03/24 1327 04/07/24 0400 04/09/24 0524  Weight: 59.6 kg 58.8 kg 62.7 kg    Exam:  General:  Appears calm and comfortable and is in NAD Eyes:  normal lids, iris ENT:   grossly normal hearing, lips & tongue, mmm Cardiovascular:  RRR. No LE edema.  Respiratory:   CTA bilaterally with no wheezes/rales/rhonchi.  Normal respiratory effort. Abdomen:  midline surgical bandage in place, R left-sided abdominal drains in place Skin:  no rash or induration seen on limited exam Musculoskeletal:  generalized weakness, no bony abnormality Psychiatric: blunted mood and affect, speech sparse but appropriate Neurologic:  CN 2-12 grossly intact  Data Reviewed: I have reviewed the patient's lab results since admission.  Pertinent labs for today include:   Glucose 150 Phos 2.2 Fluid culture pending     Family Communication: Daughter was present  Mobility: PT/OT Consulted and are recommending - Skilled Nursing-Short Term Rehab (<3 Hours/Day)04/09/2024 1453    Code Status: Full Code   Disposition: Status is: Inpatient Remains inpatient appropriate because: ongoing management     Time spent: 50 minutes  Unresulted Labs (From admission, onward)     Start     Ordered   04/13/24 0500  Triglycerides  (TPN Lab Panel)  Every Monday (0500),   R     Question:  Specimen collection method  Answer:  Lab=Lab collect   04/07/24 1027   04/10/24 0500  Basic metabolic panel with GFR  Daily,   R     Question:  Specimen collection method  Answer:  Unit=Unit collect   04/08/24 0815   04/10/24 0500  Magnesium   Daily,   R     Question:  Specimen collection method  Answer:  Unit=Unit collect   04/08/24 0815   04/10/24 0500  Phosphorus  Daily,   R     Question:  Specimen collection method  Answer:  Unit=Unit collect   04/08/24 0815   04/09/24 0500  Comprehensive metabolic panel  (TP[facility] Lab Panel)  Every Mon,Thu (0500),   R     Question:  Specimen collection method  Answer:  Lab=Lab collect   04/07/24 1027   04/09/24 0500  Magnesium   (TP[facility] Lab Panel)  Every Mon,Thu (0500),   R     Question:  Specimen collection method  Answer:  Lab=Lab collect   04/07/24 1027   04/09/24  0500  Phosphorus  (TP[facility] Lab Panel)  Every Mon,Thu (0500),   R     Question:  Specimen collection method  Answer:  Lab=Lab collect   04/07/24 1027             Author: Delon Herald, MD 04/11/2024 3:17 PM  For on call review [email or web address].

## 2024-04-11 NOTE — Assessment & Plan Note (Signed)
 At baseline Continue namenda  Delirium precautions

## 2024-04-11 NOTE — Assessment & Plan Note (Addendum)
 Nutrition Problem: Moderate Malnutrition Etiology: chronic illness Signs/Symptoms: mild fat depletion, moderate muscle depletion Interventions: Refer to RD note for recommendations, TPN

## 2024-04-11 NOTE — Plan of Care (Signed)
 Patient admission education requires reinforcement

## 2024-04-11 NOTE — Assessment & Plan Note (Signed)
 Replete K+, Mag++, Phos as needed

## 2024-04-11 NOTE — Progress Notes (Signed)
 Patient ID: Angelica [name], female   DOB: 07-06-1940, 83 y.o.   MRN: [id number]   Acute Care Surgery Service Progress Note:    Chief Complaint/Subjective: Moaning States she wants to go home O/w no acute events noted  Objective: Vital signs in last 24 hours: Temp:  [97.6 F (36.4 C)-98.1 F (36.7 C)] 97.8 F (36.6 C) (11/22 0922) Pulse Rate:  [65-81] 77 (11/22 0922) Resp:  [6-22] 17 (11/22 0922) BP: (101-148)/(40-94) 135/77 (11/22 0922) SpO2:  [97 %-100 %] 97 % (11/22 0520) Last BM Date :  (PTA)  Intake/Output from previous day: 11/21 0701 - 11/22 0700 In: 2649.4 [I.V.:1824.4; IV Piggyback:810.1] Out: 1695 [Urine:1650; Drains:45] Intake/Output this shift: No intake/output data recorded.  Lungs:  nonlabored  Cardiovascular: reg  Abd: soft, nd, drains- serosang  Extremities: no edema, +SCDs  Neuro: alert,   Lab Results: CBC  Recent Labs    04/09/24 1039 04/10/24 1023  WBC 38.1* 34.8*  HGB 11.3* 9.8*  HCT 33.7* 29.4*  PLT 249 203   BMET Recent Labs    04/10/24 1023 04/11/24 0335  NA 134* 135  K 3.7 3.9  CL 105 106  CO2 22 23  GLUCOSE 139* 150*  BUN 15 17  CREATININE 0.51 0.49  CALCIUM  7.9* 8.0*   LFT    Latest Ref Rng & Units 04/09/2024   10:39 AM 04/08/2024    4:25 AM 04/06/2024    8:53 AM  Hepatic Function  Total Protein 6.5 - 8.1 g/dL 5.9  5.8  6.3   Albumin  3.5 - 5.0 g/dL 2.1  2.1  2.3   AST 15 - 41 U/L 17  15  21    ALT 0 - 44 U/L 5  5  6    Alk Phosphatase 38 - 126 U/L 58  59  86   Total Bilirubin 0.0 - 1.2 mg/dL 0.5  0.2  0.3    PT/INR No results for input(s): LABPROT, INR in the last 72 hours. ABG No results for input(s): PHART, HCO3 in the last 72 hours.  Invalid input(s): PCO2, PO2  Studies/Results:  Anti-infectives: Anti-infectives (From admission, onward)    Start     Dose/Rate Route Frequency Ordered Stop   04/06/24 1300  metroNIDAZOLE  (FLAGYL ) IVPB 500 mg        500 mg 100 mL/hr over 60 Minutes  Intravenous Every 12 hours 04/06/24 1217     04/03/24 1330  metroNIDAZOLE  (FLAGYL ) IVPB 500 mg        500 mg 100 mL/hr over 60 Minutes Intravenous On call to O.R. 04/03/24 1242 04/03/24 1507   03/30/24 2200  vancomycin  (VANCOREADY) IVPB 750 mg/150 mL  Status:  Discontinued        750 mg 150 mL/hr over 60 Minutes Intravenous Every 24 hours 03/29/24 1620 04/03/24 1310   03/30/24 0400  ceFEPIme  (MAXIPIME ) 2 g in sodium chloride  0.9 % 100 mL IVPB        2 g 200 mL/hr over 30 Minutes Intravenous Every 12 hours 03/29/24 1620     03/29/24 1530  vancomycin  (VANCOCIN ) IVPB 1000 mg/200 mL premix        1,000 mg 200 mL/hr over 60 Minutes Intravenous  Once 03/29/24 1517 03/30/24 0228   03/29/24 1530  ceFEPIme  (MAXIPIME ) 2 g in sodium chloride  0.9 % 100 mL IVPB        2 g 200 mL/hr over 30 Minutes Intravenous  Once 03/29/24 1518 03/29/24 1724       Medications: Scheduled Meds:  budesonide  (PULMICORT ) nebulizer solution  0.5 mg Nebulization BID   Chlorhexidine  Gluconate Cloth  6 each Topical Q0600   enoxaparin  (LOVENOX ) injection  40 mg Subcutaneous Q24H   fluticasone   1 spray Each Nare Daily   insulin  aspart  0-9 Units Subcutaneous Q6H   ipratropium  2 spray Each Nare TID   levothyroxine   50 mcg Oral QAC breakfast   methocarbamol  (ROBAXIN ) injection  500 mg Intravenous Q8H   montelukast   10 mg Oral QHS   pantoprazole  (PROTONIX ) IV  40 mg Intravenous Q12H   pravastatin   80 mg Oral Daily   sodium chloride  flush  10-40 mL Intracatheter Q12H   sodium chloride  flush  5 mL Intracatheter Q8H   thiamine  (VITAMIN B1) injection  100 mg Intravenous Daily   Continuous Infusions:  sodium chloride  40 mL/hr at 04/10/24 1812   sodium chloride      ceFEPime  (MAXIPIME ) IV 2 g (04/11/24 0432)   metronidazole  500 mg (04/11/24 1028)   potassium PHOSPHATE  IVPB (in mmol)     TPN ADULT (ION) 30 mL/hr at 04/10/24 1812   TPN ADULT (ION)     PRN Meds:.albuterol , hydrALAZINE , lip balm, morphine  injection,  ondansetron  **OR** ondansetron  (ZOFRAN ) IV, mouth rinse, sodium chloride  flush  Assessment/Plan: Patient Active Problem List   Diagnosis Date Noted   Sinus bradycardia 04/10/2024   Electrolyte abnormality 04/10/2024   Prediabetes 04/08/2024   Pleural effusion associated with pulmonary infection vs. recurrent malignancy and sepsis 03/29/2024   Elevated troponin 03/29/2024   Sepsis (HCC) 03/29/2024   Low back pain 10/13/2023   Arthritis of right knee 06/20/2023   Corn of toe 06/20/2023   Mastodynia of left breast 02/13/2023   Pleural effusion 11/21/2022   Swelling of breast 11/17/2022   Dyspnea 11/14/2022   Iron  deficiency anemia 10/11/2022   Acute respiratory failure with hypoxia (HCC) 10/05/2022   Bilateral pleural effusion 10/05/2022   History of stroke 10/05/2022   Weakness 10/05/2022   Edema 10/05/2022   Alzheimer's disease (HCC) 09/06/2022   Ex-smoker 09/06/2022   Hypothyroidism 09/06/2022   Right leg pain 07/13/2021   Dementia (HCC) 07/13/2021   RLQ abdominal pain 07/13/2021   Anosmia 05/01/2021   Chronic ethmoidal sinusitis 02/14/2021   Debility 02/01/2021   Gait disorder 02/01/2021   Port-A-Cath in place 11/02/2020   Adenocarcinoma of left lung, stage 3 (HCC) 06/22/2020   Lung mass    Pulmonary nodule 05/17/2020   Iron  deficiency 05/03/2020   Swelling of lower extremity 07/30/2019   Ganglion cyst of right foot 01/16/2019   HLD (hyperlipidemia) 01/16/2019   LPRD (laryngopharyngeal reflux disease) 07/25/2018   Constipation 08/19/2017   Hypokalemia 07/09/2017   Asthma, severe persistent, well-controlled (HCC) 06/04/2017   Chronic pansinusitis 06/04/2017   Nasal polyposis 06/04/2017   Osteopenia 05/26/2017   GERD (gastroesophageal reflux disease)    H. pylori infection    Other allergic rhinitis 03/20/2016   Malnutrition of moderate degree 04/11/2015   SBO (small bowel obstruction) (HCC) 04/10/2015   HTN (hypertension) 04/10/2015   s/p  Procedure(s): EXPLORATORY LAPAROTOMY, LYSIS OF ADHESIONS, SMALL BOWEL RESECTION, SMALL BOWEL REPAIR 04/03/2024  POD 8, s/p ex lap with SBR, Dr. White 11/14 -CT CAP reviewed.  2 large fluid collections in pelvis -IR drain x 2 11/21 - f/u drain cultures -CLD -DC foley -mobilize as able, PT/OT eval -picc and TNA given poor nutrition and the likelihood that this patient will struggle with enteral intake as well moving forward -reviewed SLP note 11/22    FEN - CLD/TNA/IVFs  per primary VTE - Lovenox  ID - Cefipime, flagyl , continue for now given CT findings. Foley - DC 11/21   HTN HLD h/o CVA Alzheimer's disease (although oriented currently) Stage III adenocarcinoma of lung GERD Hypothyroidism PCM - TNA Disposition:  LOS: 13 days    Olesya Wike M. Tanda, MD, FACS General, Bariatric, & Minimally Invasive Surgery [phone] [facility], [facility]

## 2024-04-11 NOTE — Assessment & Plan Note (Signed)
Continue protonix daily. 

## 2024-04-11 NOTE — Assessment & Plan Note (Signed)
 No signs of exacerbation  Continue bronchodilators

## 2024-04-11 NOTE — Assessment & Plan Note (Signed)
 Flat and mildly elevated troponin 27>28 No chest pain or palpitations, no changes on EKG  Related to demand ischemia in the setting of sepsis

## 2024-04-11 NOTE — Assessment & Plan Note (Signed)
 Diagnosed in 05/2020 S/p concurrent chemoradiation with weekly carboplatin  for AUC of 2 and paclitaxel  45 mg/M2. S/P 5 X -2022 S/p Consolidation treatment with immunotherapy with Imfinzi  1500 mg IV q 4 wk x 3 cycles - discontinued 2/2 intolerance/sepsis Currently on observation Pleural fluid cytology was negative from 11/12

## 2024-04-11 NOTE — Progress Notes (Signed)
 The patient has not urinated this shift. Messaged Abigail Chavez for an in and out catheter order.

## 2024-04-11 NOTE — Assessment & Plan Note (Signed)
 Normal TSH, mildly elevated free T4 Continue synthroid  50mcg daily

## 2024-04-11 NOTE — Progress Notes (Signed)
 I & O catheter at 2:30 yielded 625 mL of amber, clear urine.

## 2024-04-11 NOTE — Assessment & Plan Note (Signed)
 RN noted periodic short episodes of bradycardia with sleep No intervention would be appropriate Will continue to monitor on telemetry

## 2024-04-11 NOTE — Assessment & Plan Note (Signed)
 Allergy to ASA On plavix  and high intensity statin PTA Per neuro note October 30 2023- She had an MRI scan of the brain done on 07/18/2021 which I personally reviewed and shows only mild changes of small vessel disease and generalized atrophy. I do not see a definite right cerebellar stroke but the radiologist has called 1 which apparently was not seen on previous MRI from 06/14/2020. Patient has since been started on Plavix  by Dr. Norleen   Neuro Dr Rosemarie did not feel it was CVA Plavix  is currently on hold

## 2024-04-11 NOTE — Assessment & Plan Note (Signed)
 K+ 2.4 this AM Repleted Recheck in AM

## 2024-04-11 NOTE — Assessment & Plan Note (Signed)
 Continue high intensity statin

## 2024-04-12 DIAGNOSIS — J918 Pleural effusion in other conditions classified elsewhere: Secondary | ICD-10-CM | POA: Diagnosis not present

## 2024-04-12 DIAGNOSIS — J189 Pneumonia, unspecified organism: Secondary | ICD-10-CM | POA: Diagnosis not present

## 2024-04-12 LAB — BASIC METABOLIC PANEL WITH GFR
Anion gap: 6 (ref 5–15)
BUN: 16 mg/dL (ref 8–23)
CO2: 24 mmol/L (ref 22–32)
Calcium: 8 mg/dL — ABNORMAL LOW (ref 8.9–10.3)
Chloride: 104 mmol/L (ref 98–111)
Creatinine, Ser: 0.52 mg/dL (ref 0.44–1.00)
GFR, Estimated: >60 mL/min (ref >60)
Glucose, Bld: 220 mg/dL — ABNORMAL HIGH (ref 70–99)
Potassium: 4.2 mmol/L (ref 3.5–5.1)
Sodium: 134 mmol/L — ABNORMAL LOW (ref 135–145)

## 2024-04-12 LAB — GLUCOSE, CAPILLARY
Glucose-Capillary: 103 mg/dL — ABNORMAL HIGH (ref 70–99)
Glucose-Capillary: 118 mg/dL — ABNORMAL HIGH (ref 70–99)
Glucose-Capillary: 125 mg/dL — ABNORMAL HIGH (ref 70–99)
Glucose-Capillary: 135 mg/dL — ABNORMAL HIGH (ref 70–99)
Glucose-Capillary: 149 mg/dL — ABNORMAL HIGH (ref 70–99)
Glucose-Capillary: 158 mg/dL — ABNORMAL HIGH (ref 70–99)

## 2024-04-12 LAB — MAGNESIUM: Magnesium: 1.8 mg/dL (ref 1.7–2.4)

## 2024-04-12 LAB — PHOSPHORUS: Phosphorus: 2.4 mg/dL — ABNORMAL LOW (ref 2.5–4.6)

## 2024-04-12 MED ORDER — SODIUM CHLORIDE 0.9 % IV SOLN
15.0000 mmol | Freq: Once | INTRAVENOUS | Status: AC
  Administered 2024-04-12: 15 mmol via INTRAVENOUS
  Filled 2024-04-12: qty 5

## 2024-04-12 MED ORDER — MAGNESIUM SULFATE 2 GM/50ML IV SOLN
2.0000 g | Freq: Once | INTRAVENOUS | Status: AC
  Administered 2024-04-12: 2 g via INTRAVENOUS
  Filled 2024-04-12: qty 50

## 2024-04-12 MED ORDER — MUSCLE RUB 10-15 % EX CREA
TOPICAL_CREAM | CUTANEOUS | Status: DC | PRN
  Administered 2024-04-12: 1 via TOPICAL
  Filled 2024-04-12: qty 85

## 2024-04-12 MED ORDER — INSULIN ASPART 100 UNIT/ML IJ SOLN
0.0000 [IU] | INTRAMUSCULAR | Status: DC
  Administered 2024-04-12 (×2): 2 [IU] via SUBCUTANEOUS
  Administered 2024-04-12: 3 [IU] via SUBCUTANEOUS
  Administered 2024-04-13 – 2024-04-14 (×5): 2 [IU] via SUBCUTANEOUS
  Administered 2024-04-14: 3 [IU] via SUBCUTANEOUS
  Administered 2024-04-14 – 2024-04-16 (×7): 2 [IU] via SUBCUTANEOUS
  Administered 2024-04-16: 3 [IU] via SUBCUTANEOUS
  Administered 2024-04-16: 2 [IU] via SUBCUTANEOUS
  Administered 2024-04-16: 3 [IU] via SUBCUTANEOUS
  Administered 2024-04-17: 2 [IU] via SUBCUTANEOUS
  Filled 2024-04-12 (×10): qty 2
  Filled 2024-04-12: qty 3
  Filled 2024-04-12 (×3): qty 2
  Filled 2024-04-12: qty 3
  Filled 2024-04-12: qty 2
  Filled 2024-04-12: qty 3
  Filled 2024-04-12: qty 1
  Filled 2024-04-12 (×2): qty 2

## 2024-04-12 MED ORDER — TRAVASOL 10 % IV SOLN
INTRAVENOUS | Status: AC
  Filled 2024-04-12: qty 636

## 2024-04-12 NOTE — Assessment & Plan Note (Signed)
 Flat and mildly elevated troponin 27>28 No chest pain or palpitations, no changes on EKG  Related to demand ischemia in the setting of sepsis

## 2024-04-12 NOTE — Assessment & Plan Note (Signed)
Continue protonix daily. 

## 2024-04-12 NOTE — Assessment & Plan Note (Signed)
 A1c 5.9 Will cover with sensitive-scale SSI

## 2024-04-12 NOTE — Assessment & Plan Note (Signed)
 Diagnosed in 05/2020 S/p concurrent chemoradiation with weekly carboplatin  for AUC of 2 and paclitaxel  45 mg/M2. S/P 5 X -2022 S/p Consolidation treatment with immunotherapy with Imfinzi  1500 mg IV q 4 wk x 3 cycles - discontinued 2/2 intolerance/sepsis Currently on observation Pleural fluid cytology was negative from 11/12

## 2024-04-12 NOTE — Progress Notes (Signed)
 Occupational Therapy Treatment Patient Details Name: Angelica [name] MRN: [id number] DOB: 1941-05-07 Today's Date: 04/12/2024   History of present illness Angelica [name] is a 83 yr old female who presented to ED with complaints of weakness; admitted 11/9 with large loculated left pleural effusion.03/31/2024 diagnosed with SBO, s/p exploratory laparotomy on 11/14, placed NGT 04/01/24 s/p pigtail chest tube placed. PMH: GERD, HTN, HLD, IDA, asthma, stage 3b adenocarcinoma of the lung, hypothyroidism, CVA, Alzheimer's, SBO due to adhesions in 2016 and 2021   OT comments  Patient was agreeable to therapy participation, though noted to report having increased abdominal pain at rest in bed. Pt noted to grimace and guard, stating I need pain medication (nurse informed and pain medication given during session. Pt was instructed on and performed simple BUE and BLE ROM and therapeutic exercises at bed level, requiring min assist overall. She subsequently performed upper body grooming with min assist. Attempts at progressive activity were deferred this date, due to patient's increased pain and compromised activity tolerance. Continue OT plan of care. Patient will benefit from continued inpatient follow up therapy, <3 hours/day.       If plan is discharge home, recommend the following:  Two people to help with walking and/or transfers;Two people to help with bathing/dressing/bathroom;Assistance with cooking/housework;Direct supervision/assist for medications management;Direct supervision/assist for financial management;Assist for transportation;Help with stairs or ramp for entrance;Supervision due to cognitive status   Equipment Recommendations  None recommended by OT    Recommendations for Other Services      Precautions / Restrictions Precautions Precaution/Restrictions Comments: midline incision Restrictions Weight Bearing Restrictions Per Provider Order: No       Mobility Bed Mobility                General bed mobility comments: deferred, due to pt reporting significant pain at rest           ADL either performed or assessed with clinical judgement   ADL Overall ADL's : Needs assistance/impaired Eating/Feeding: Set up;Bed level Eating/Feeding Details (indicate cue type and reason): Pt drank water  from a cup in bed. She required verbal cues to swallow water  as she appeared to hold water  in her mouth. Grooming: Minimal assistance;Bed level;Wash/dry face;Oral care Grooming Details (indicate cue type and reason): The patient required SBA to min assist to perform face washing, teeth brushing, and applying lip moisturizer in bed.           Cognition Arousal: Alert           Memory impairment (select all impairments): Short-term memory Attention impairment (select first level of impairment): Focused attention Executive functioning impairment (select all impairments): Problem solving, Sequencing        Following commands: Impaired Following commands impaired: Follows one step commands inconsistently      Cueing   Cueing Techniques: Verbal cues             Pertinent Vitals/ Pain       Pain Assessment Pain Assessment: Faces Pain Score: 6  Pain Location: abdomen Pain Intervention(s): Limited activity within patient's tolerance, Monitored during session, Repositioned, RN gave pain meds during session   Frequency  Min 2X/week        Progress Toward Goals  OT Goals(current goals can now be found in the care plan section)  Progress towards OT goals: Progressing toward goals  Acute Rehab OT Goals OT Goal Formulation: Patient unable to participate in goal setting Time For Goal Achievement: 04/17/24 Potential to Achieve Goals: Good  Plan  AM-PAC OT 6 Clicks Daily Activity     Outcome Measure   Help from another person eating meals?: A Little Help from another person taking care of personal grooming?: A Little Help from another  person toileting, which includes using toliet, bedpan, or urinal?: Total Help from another person bathing (including washing, rinsing, drying)?: A Lot Help from another person to put on and taking off regular upper body clothing?: A Lot Help from another person to put on and taking off regular lower body clothing?: Total 6 Click Score: 12    End of Session Equipment Utilized During Treatment: Other (comment) (N/A)  OT Visit Diagnosis: Pain;Muscle weakness (generalized) (M62.81);Other symptoms and signs involving cognitive function;Other abnormalities of gait and mobility (R26.89);Unsteadiness on feet (R26.81) Pain - part of body:  (abdomen)   Activity Tolerance Patient limited by pain   Patient Left in bed;with call bell/phone within reach;with bed alarm set   Nurse Communication Patient requests pain meds        Time: 8360-8347 OT Time Calculation (min): 13 min  Charges: OT General Charges $OT Visit: 1 Visit OT Treatments $Self Care/Home Management : 8-22 mins     [name], OTR/L 04/12/2024, 4:58 PM

## 2024-04-12 NOTE — Assessment & Plan Note (Signed)
 K+ 2.4 this AM Repleted Recheck in AM

## 2024-04-12 NOTE — Progress Notes (Signed)
 Progress Note   Patient: Angelica [name] FMW:[id number] DOB: 10/29/1940 DOA: 03/29/2024     14 DOS: the patient was seen and examined on 04/12/2024   Brief hospital course: 83yo with h/o GERD, HTN, HLD, IDA, asthma, stage 3b adenocarcinoma of the lung, hypothyroidism, CVA, Alzheimer's dementia, and hx of SBO due to adhesions in 2016 and 2021 who presented on 11/9 with weakness and breast swelling. She was noted to have leukocytosis with lactate of 2.1.  CXR with near-complete opacification of the left lung, differential includes large malignant pleural effusion, central hilar mass with obstruction, lobar atelectasis, and/or pneumonia.  CT A/P with large highly located left pleural effusion with compressive atelectasis throughout the left lung.  She was started on antibiotics, pulmonary consulted.  11/11 also found to have SBO, NG tube placed, surgery consulted.  Underwent lysis of adhesions with small bowel resection on 11/14.  On TPN.   Assessment & Plan Pleural effusion associated with pulmonary infection vs. recurrent malignancy and sepsis Presenting with weakness, cough  PCCM was consulting, now signed off Blood culture negative On room air Continue broad-spectrum antibiotics w/ vancomycin  and cefepime   Chest tube placed 11/12, removed 11/15 Fluid studies suggest possible infectious etiology due to elevated WBC count, elevated LDH, elevated protein fluid but culture negative Marked leukocytosis 11/20; see below re: abdomen but CT chest relatively unremarkable SBO (small bowel obstruction) (HCC) H/o prior SBO x 2 Patient complained of stomach upset and vomited 11/11; x-ray concerning for SBO  Surgery consulted NG tube inserted Repeat CT abdomen 11/11 small bowel obstruction with proximal transition point just beyond the ligament of Treitz Underwent ex lap with lysis of adhesion and small bowel resection on 11/14 Started on TPN Marked leukocytosis 11/20 with reported significant  abdominal pain CT showed interval partial SBO with resolution of previously noted SBO with oral contrast into the distal transverse colon Also showed loculated intraperitoneal abscess in LLQ 1.8 x 3.8 x 10.5 cm IR placed a CT-guided perc drain on 11/21; cultures NTD Advanced to CLD on 11/22, full liquids on 11/23 Weakness Likely secondary to above Fall precautions PT/OT recommending STR Starting calorie count Will need to be able to maintain nutritional status without TPN in order to transition to STR Adenocarcinoma of left lung, stage 3 (HCC) Diagnosed in 05/2020 S/p concurrent chemoradiation with weekly carboplatin  for AUC of 2 and paclitaxel  45 mg/M2. S/P 5 X -2022 S/p Consolidation treatment with immunotherapy with Imfinzi  1500 mg IV q 4 wk x 3 cycles - discontinued 2/2 intolerance/sepsis Currently on observation Pleural fluid cytology was negative from 11/12 HTN (hypertension) Continue amlodipine   PRN metoprolol Elevated troponin Flat and mildly elevated troponin 27>28 No chest pain or palpitations, no changes on EKG  Related to demand ischemia in the setting of sepsis HLD (hyperlipidemia) Continue high intensity statin  History of stroke Allergy to ASA On plavix  and high intensity statin PTA Per neuro note October 30 2023- She had an MRI scan of the brain done on 07/18/2021 which I personally reviewed and shows only mild changes of small vessel disease and generalized atrophy. I do not see a definite right cerebellar stroke but the radiologist has called 1 which apparently was not seen on previous MRI from 06/14/2020. Patient has since been started on Plavix  by Dr. Norleen   Neuro Dr Rosemarie did not feel it was CVA Plavix  is currently on hold Hypothyroidism Normal TSH, mildly elevated free T4 Continue synthroid  50mcg daily  Asthma, severe persistent, well-controlled (HCC) No signs of  exacerbation  Continue bronchodilators Alzheimer's disease (HCC) At baseline Continue  namenda  Delirium precautions  GERD (gastroesophageal reflux disease) Continue protonix  daily  Malnutrition of moderate degree Nutrition Problem: Moderate Malnutrition Etiology: chronic illness Signs/Symptoms: mild fat depletion, moderate muscle depletion Interventions: Refer to RD note for recommendations, TPN Hypokalemia K+ 2.4 this AM Repleted Recheck in AM Prediabetes A1c 5.9 Will cover with sensitive-scale SSI Sinus bradycardia RN noted periodic short episodes of bradycardia with sleep No intervention would be appropriate Will continue to monitor on telemetry Electrolyte abnormality Replete K+, Mag++, Phos as needed      Consultants: Pulmonology Surgery IR Palliative PT OT Dietician TOC team   Procedures: Chest tube insertion 11/12 Ex lap, lysis of adhesions, small bowel resection 11/14 CT-guided perc drain placement 11/21   Antibiotics: Cefepime  11/9- Metronidazole  11/14- Vancomycin  11/9-14  30 Day Unplanned Readmission Risk Score    Flowsheet Row ED to Hosp-Admission (Current) from 03/29/2024 in [place]  30 Day Unplanned Readmission Risk Score (%) 30.13 Filed at 04/12/2024 0401    This score is the patient's risk of an unplanned readmission within 30 days of being discharged (0 -100%). The score is based on dignosis, age, lab data, medications, orders, and past utilization.   Low:  0-14.9   Medium: 15-21.9   High: 22-29.9   Extreme: 30 and above           Subjective: Appears to be feeling better.  More engaged, not complaining of specific issues.   Objective: Vitals:   04/11/24 2227 04/12/24 0555  BP: (!) 156/44 136/69  Pulse: 80 (!) 57  Resp: 15 16  Temp: 97.7 F (36.5 C) 98.4 F (36.9 C)  SpO2: 99% (!) 79%    Intake/Output Summary (Last 24 hours) at 04/12/2024 1422 Last data filed at 04/12/2024 0800 Gross per 24 hour  Intake 1798.87 ml  Output 1450 ml  Net 348.87 ml   Filed Weights   04/03/24 1327  04/07/24 0400 04/09/24 0524  Weight: 59.6 kg 58.8 kg 62.7 kg    Exam:  General:  Appears calm and comfortable and is in NAD Eyes:  normal lids, iris ENT:  grossly normal hearing, lips & tongue, mmm Cardiovascular:  RRR. No LE edema.  Respiratory:   CTA bilaterally with no wheezes/rales/rhonchi.  Normal respiratory effort. Abdomen:  midline abdominal dressing C/D/I; LLQ perc drains x 2 in place Skin:  no rash or induration seen on limited exam Musculoskeletal:  generalized weakness, no bony abnormality Psychiatric:  pleasant mood and affect, speech sparse and somewhat appropriate Neurologic:  CN 2-12 grossly intact  Data Reviewed: I have reviewed the patient's lab results since admission.  Pertinent labs for today include:   Glucose 220 Phos 2.4 Abscess culture NTD    Family Communication: Daughter was present  Mobility: PT/OT Consulted and are recommending - Skilled Nursing-Short Term Rehab (<3 Hours/Day)04/09/2024 1453    Code Status: Full Code  Barriers to discharge:  needs to meet PO nutritional needs and get off TPN prior to dc to STR  Disposition: Status is: Inpatient Remains inpatient appropriate because: ongoing management     Time spent: 50 minutes  Unresulted Labs (From admission, onward)     Start     Ordered   04/13/24 0500  Triglycerides  (TPN Lab Panel)  Every Monday (0500),   R     Question:  Specimen collection method  Answer:  Lab=Lab collect   04/07/24 1027   04/13/24 0500  CBC  Tomorrow morning,  R       Question:  Specimen collection method  Answer:  IV Team=IV Team collect   04/12/24 0911   04/13/24 0500  Basic metabolic panel  Tomorrow morning,   R       Question:  Specimen collection method  Answer:  IV Team=IV Team collect   04/12/24 0911   04/09/24 0500  Comprehensive metabolic panel  (TPN Lab Panel)  Every Mon,Thu (0500),   R     Question:  Specimen collection method  Answer:  Lab=Lab collect   04/07/24 1027   04/09/24 0500   Magnesium   (TPN Lab Panel)  Every Mon,Thu (0500),   R     Question:  Specimen collection method  Answer:  Lab=Lab collect   04/07/24 1027   04/09/24 0500  Phosphorus  (TPN Lab Panel)  Every Mon,Thu (0500),   R     Question:  Specimen collection method  Answer:  Lab=Lab collect   04/07/24 1027             Author: Delon Herald, MD 04/12/2024 2:22 PM  For on call review [email or web address].

## 2024-04-12 NOTE — Assessment & Plan Note (Addendum)
 H/o prior SBO x 2 Patient complained of stomach upset and vomited 11/11; x-ray concerning for SBO  Surgery consulted NG tube inserted Repeat CT abdomen 11/11 small bowel obstruction with proximal transition point just beyond the ligament of Treitz Underwent ex lap with lysis of adhesion and small bowel resection on 11/14 Started on TPN Marked leukocytosis 11/20 with reported significant abdominal pain CT showed interval partial SBO with resolution of previously noted SBO with oral contrast into the distal transverse colon Also showed loculated intraperitoneal abscess in LLQ 1.8 x 3.8 x 10.5 cm IR placed a CT-guided perc drain on 11/21; cultures NTD Advanced to CLD on 11/22, full liquids on 11/23

## 2024-04-12 NOTE — Assessment & Plan Note (Signed)
 Allergy to ASA On plavix  and high intensity statin PTA Per neuro note October 30 2023- She had an MRI scan of the brain done on 07/18/2021 which I personally reviewed and shows only mild changes of small vessel disease and generalized atrophy. I do not see a definite right cerebellar stroke but the radiologist has called 1 which apparently was not seen on previous MRI from 06/14/2020. Patient has since been started on Plavix  by Dr. Norleen   Neuro Dr Rosemarie did not feel it was CVA Plavix  is currently on hold

## 2024-04-12 NOTE — Assessment & Plan Note (Signed)
 No signs of exacerbation  Continue bronchodilators

## 2024-04-12 NOTE — Assessment & Plan Note (Signed)
 Replete K+, Mag++, Phos as needed

## 2024-04-12 NOTE — Assessment & Plan Note (Signed)
 Continue amlodipine   PRN metoprolol

## 2024-04-12 NOTE — Assessment & Plan Note (Signed)
 At baseline Continue namenda  Delirium precautions

## 2024-04-12 NOTE — Assessment & Plan Note (Signed)
 Presenting with weakness, cough  PCCM was consulting, now signed off Blood culture negative On room air Continue broad-spectrum antibiotics w/ vancomycin  and cefepime   Chest tube placed 11/12, removed 11/15 Fluid studies suggest possible infectious etiology due to elevated WBC count, elevated LDH, elevated protein fluid but culture negative Marked leukocytosis 11/20; see below re: abdomen but CT chest relatively unremarkable

## 2024-04-12 NOTE — Plan of Care (Signed)
  Problem: Activity: Goal: Risk for activity intolerance will decrease Outcome: Progressing   Problem: Coping: Goal: Level of anxiety will decrease Outcome: Progressing   Problem: Pain Managment: Goal: General experience of comfort will improve and/or be controlled Outcome: Progressing   Problem: Safety: Goal: Ability to remain free from injury will improve Outcome: Progressing

## 2024-04-12 NOTE — Assessment & Plan Note (Signed)
 Normal TSH, mildly elevated free T4 Continue synthroid  50mcg daily

## 2024-04-12 NOTE — Progress Notes (Signed)
 Patient ID: Angelica [name], female   DOB: [birth year], 83 y.o.   MRN: [id number]   Acute Care Surgery Service Progress Note:    Chief Complaint/Subjective: Not moaning today BM documented Unclear how much pt drinking O/w no acute events noted  Objective: Vital signs in last 24 hours: Temp:  [97.7 F (36.5 C)-98.4 F (36.9 C)] 98.4 F (36.9 C) (11/23 0555) Pulse Rate:  [57-106] 57 (11/23 0555) Resp:  [15-20] 16 (11/23 0555) BP: (136-161)/(44-92) 136/69 (11/23 0555) SpO2:  [79 %-100 %] 79 % (11/23 0555) Last BM Date :  (PTA)  Intake/Output from previous day: 11/22 0701 - 11/23 0700 In: 1758.9 [P.O.:20; I.V.:938.6; IV Piggyback:800.3] Out: 1450 [Urine:1450] Intake/Output this shift: Total I/O In: 50 [P.O.:50] Out: -   Lungs:  nonlabored  Cardiovascular: reg  Abd: soft, nd, drains- serosang  Extremities: no edema, +SCDs  Neuro: alert,   Lab Results: CBC  Recent Labs    04/09/24 1039 04/10/24 1023  WBC 38.1* 34.8*  HGB 11.3* 9.8*  HCT 33.7* 29.4*  PLT 249 203   BMET Recent Labs    04/11/24 0335 04/12/24 0237  NA 135 134*  K 3.9 4.2  CL 106 104  CO2 23 24  GLUCOSE 150* 220*  BUN 17 16  CREATININE 0.49 0.52  CALCIUM  8.0* 8.0*   LFT    Latest Ref Rng & Units 04/09/2024   10:39 AM 04/08/2024    4:25 AM 04/06/2024    8:53 AM  Hepatic Function  Total Protein 6.5 - 8.1 g/dL 5.9  5.8  6.3   Albumin  3.5 - 5.0 g/dL 2.1  2.1  2.3   AST 15 - 41 U/L 17  15  21    ALT 0 - 44 U/L 5  5  6    Alk Phosphatase 38 - 126 U/L 58  59  86   Total Bilirubin 0.0 - 1.2 mg/dL 0.5  0.2  0.3    PT/INR No results for input(s): LABPROT, INR in the last 72 hours. ABG No results for input(s): PHART, HCO3 in the last 72 hours.  Invalid input(s): PCO2, PO2  Studies/Results:  Anti-infectives: Anti-infectives (From admission, onward)    Start     Dose/Rate Route Frequency Ordered Stop   04/06/24 1300  metroNIDAZOLE  (FLAGYL ) IVPB 500 mg        500 mg 100  mL/hr over 60 Minutes Intravenous Every 12 hours 04/06/24 1217     04/03/24 1330  metroNIDAZOLE  (FLAGYL ) IVPB 500 mg        500 mg 100 mL/hr over 60 Minutes Intravenous On call to O.R. 04/03/24 1242 04/03/24 1507   03/30/24 2200  vancomycin  (VANCOREADY) IVPB 750 mg/150 mL  Status:  Discontinued        750 mg 150 mL/hr over 60 Minutes Intravenous Every 24 hours 03/29/24 1620 04/03/24 1310   03/30/24 0400  ceFEPIme  (MAXIPIME ) 2 g in sodium chloride  0.9 % 100 mL IVPB        2 g 200 mL/hr over 30 Minutes Intravenous Every 12 hours 03/29/24 1620     03/29/24 1530  vancomycin  (VANCOCIN ) IVPB 1000 mg/200 mL premix        1,000 mg 200 mL/hr over 60 Minutes Intravenous  Once 03/29/24 1517 03/30/24 0228   03/29/24 1530  ceFEPIme  (MAXIPIME ) 2 g in sodium chloride  0.9 % 100 mL IVPB        2 g 200 mL/hr over 30 Minutes Intravenous  Once 03/29/24 1518 03/29/24 1724  Medications: Scheduled Meds:  budesonide  (PULMICORT ) nebulizer solution  0.5 mg Nebulization BID   Chlorhexidine  Gluconate Cloth  6 each Topical Q0600   enoxaparin  (LOVENOX ) injection  40 mg Subcutaneous Q24H   fluticasone   1 spray Each Nare Daily   insulin  aspart  0-15 Units Subcutaneous Q4H   ipratropium  2 spray Each Nare TID   levothyroxine   50 mcg Oral QAC breakfast   methocarbamol  (ROBAXIN ) injection  500 mg Intravenous Q8H   montelukast   10 mg Oral QHS   pantoprazole  (PROTONIX ) IV  40 mg Intravenous Q12H   pravastatin   80 mg Oral Daily   sodium chloride  flush  10-40 mL Intracatheter Q12H   sodium chloride  flush  5 mL Intracatheter Q8H   thiamine  (VITAMIN B1) injection  100 mg Intravenous Daily   Continuous Infusions:  sodium chloride  20 mL/hr at 04/12/24 0218   ceFEPime  (MAXIPIME ) IV 2 g (04/12/24 0422)   magnesium  sulfate bolus IVPB 2 g (04/12/24 0851)   metronidazole  Stopped (04/11/24 2253)   sodium PHOSPHATE  IVPB (in mmol)     TPN ADULT (ION) 50 mL/hr at 04/12/24 0218   TPN ADULT (ION)     PRN  Meds:.albuterol , hydrALAZINE , lip balm, morphine  injection, ondansetron  **OR** ondansetron  (ZOFRAN ) IV, mouth rinse, sodium chloride  flush  Assessment/Plan: Patient Active Problem List   Diagnosis Date Noted   Sinus bradycardia 04/10/2024   Electrolyte abnormality 04/10/2024   Prediabetes 04/08/2024   Pleural effusion associated with pulmonary infection vs. recurrent malignancy and sepsis 03/29/2024   Elevated troponin 03/29/2024   Sepsis (HCC) 03/29/2024   Low back pain 10/13/2023   Arthritis of right knee 06/20/2023   Corn of toe 06/20/2023   Mastodynia of left breast 02/13/2023   Pleural effusion 11/21/2022   Swelling of breast 11/17/2022   Dyspnea 11/14/2022   Iron  deficiency anemia 10/11/2022   Acute respiratory failure with hypoxia (HCC) 10/05/2022   Bilateral pleural effusion 10/05/2022   History of stroke 10/05/2022   Weakness 10/05/2022   Edema 10/05/2022   Alzheimer's disease (HCC) 09/06/2022   Ex-smoker 09/06/2022   Hypothyroidism 09/06/2022   Right leg pain 07/13/2021   Dementia (HCC) 07/13/2021   RLQ abdominal pain 07/13/2021   Anosmia 05/01/2021   Chronic ethmoidal sinusitis 02/14/2021   Debility 02/01/2021   Gait disorder 02/01/2021   Port-A-Cath in place 11/02/2020   Adenocarcinoma of left lung, stage 3 (HCC) 06/22/2020   Lung mass    Pulmonary nodule 05/17/2020   Iron  deficiency 05/03/2020   Swelling of lower extremity 07/30/2019   Ganglion cyst of right foot 01/16/2019   HLD (hyperlipidemia) 01/16/2019   LPRD (laryngopharyngeal reflux disease) 07/25/2018   Constipation 08/19/2017   Hypokalemia 07/09/2017   Asthma, severe persistent, well-controlled (HCC) 06/04/2017   Chronic pansinusitis 06/04/2017   Nasal polyposis 06/04/2017   Osteopenia 05/26/2017   GERD (gastroesophageal reflux disease)    H. pylori infection    Other allergic rhinitis 03/20/2016   Malnutrition of moderate degree 04/11/2015   SBO (small bowel obstruction) (HCC) 04/10/2015    HTN (hypertension) 04/10/2015   s/p Procedure(s): EXPLORATORY LAPAROTOMY, LYSIS OF ADHESIONS, SMALL BOWEL RESECTION, SMALL BOWEL REPAIR 04/03/2024  POD 9, s/p ex lap with SBR, Dr. White 11/14 -CT CAP reviewed.  2 large fluid collections in pelvis -IR drain x 2 11/21 - f/u drain cultures- so far NTD -CLD -DC foley -mobilize as able, PT/OT eval -picc and TNA given poor nutrition and the likelihood that this patient will struggle with enteral intake as well moving forward -  reviewed SLP note 11/22    FEN - FLD/TNA/IVFs per primary; start calorie count VTE - Lovenox  ID - Cefipime, flagyl , continue for now given CT findings.; repeat CBC in am Foley - DC 11/21   HTN HLD h/o CVA Alzheimer's disease (although oriented currently) Stage III adenocarcinoma of lung GERD Hypothyroidism PCM - TNA Disposition: adv to FLD; not sure how much she is drinking; start calorie count, cont TPN for now  LOS: 14 days    Camellia HERO. Tanda, MD, FACS General, Bariatric, & Minimally Invasive Surgery [phone] [facility], A [facility]

## 2024-04-12 NOTE — Assessment & Plan Note (Addendum)
 Nutrition Problem: Moderate Malnutrition Etiology: chronic illness Signs/Symptoms: mild fat depletion, moderate muscle depletion Interventions: Refer to RD note for recommendations, TPN

## 2024-04-12 NOTE — Progress Notes (Signed)
 Paged Dr. Barbarann to  make  her aware pt has not voided today yet keeps saying she needs to...bladder scanned at >750. Ask for order for I&O cath. MD decided to place foley and provided orders in EPIC.

## 2024-04-12 NOTE — Assessment & Plan Note (Addendum)
 Likely secondary to above Fall precautions PT/OT recommending STR Starting calorie count Will need to be able to maintain nutritional status without TPN in order to transition to STR

## 2024-04-12 NOTE — Assessment & Plan Note (Signed)
 RN noted periodic short episodes of bradycardia with sleep No intervention would be appropriate Will continue to monitor on telemetry

## 2024-04-12 NOTE — Progress Notes (Signed)
 PHARMACY - TOTAL PARENTERAL NUTRITION CONSULT NOTE   Indication: Prolonged ileus  Patient Measurements: Height: 5' 5 (165.1 cm) Weight: 62.7 kg (138 lb 3.7 oz) IBW/kg (Calculated) : 57 TPN AdjBW (KG): 59.6 Body mass index is 23 kg/m.  Assessment: Presented with abdominal pain and weakness - 11/14: Ex lap with LOA, SBR and repair Prolonged post-op ileus.  Glucose / Insulin : No h/o DM. A1c 5.9% -BG goal <180. BG range: 118 - 220 (2 units SSI/24 hrs) Electrolytes: Phos  (2.4) slightly low, trending up s/p repletion -Na slightly low on max concentration in TPN -All other electrolytes WNL, including CorrCa (9.5) -Goal electrolytes with ileus: K >/= 4, Mg >/=2 Renal: Scr <1, BUN WNL and stable Hepatic: LFTs, Alk Phos, T.bili all WNL. Albumin  low Intake / Output; MIVF:  -mIVF: NS @ 20 mL/hr -UOP: 1450 mL, drains: none charted on 11/22, stool x1 GI Imaging: 11/11 CT: SBO 11/13: AbdXR: SBO 11/14: AbdXR: SBO  GI Surgeries / Procedures:  -11/14: Ex lap with LOA, SBR and repair -11/21: IR placement of drains for abdominal abscesses  Central access: Double lumen PICC placed 11/18 TPN start date:  04/07/24  Nutritional Goals: Goal TPN rate is 65 mL/hr (provides 83 g of protein and 1446 kcals per day)  RD Assessment: Estimated Needs Total Energy Estimated Needs: 1450-1600 kcals Total Protein Estimated Needs: 70-85 grams Total Fluid Estimated Needs: >/= 1.5L  Current Nutrition:  CLD TPN  Lipid recommendations in TPN: - Peanut allergy: Use SMOFlipid with potential cross reactivity risk with soybean - Fish Allergy: (non-anaphylactic): Use Intralipid  - Shellfish Allergy (non-anaphylactic): If fish allergy confirmed or the patient avoids all seafood, Use Intralipid   - For the short-term (< 7-10 days), benefits/risks can be discussed with patient or intravenous fat emulsions can be held    Plan:  Now:  Increase SSI from sSSI q6h to mSSI q4h Mg 2 g IV once Na Phos 15 mmol IV  once  At 18:00: Continue TPN at 50 mL/hr  No lipids due to allergies Will provide 1111 kcal, 64 g protein, 252 g dextrose  Electrolytes in TPN: No changes Na 150 mEq/L, K 50 mEq/L, Ca 5 mEq/L, Mg 5 mEq/L, and Phos 15 mmol/L.  Cl:Ac 1:2 Add standard MVI and trace elements to TPN Continue moderate SSI q4h and adjust as needed  mIVF at 20 mL/hr Monitor TPN labs on Mon/Thurs.   Ronal CHRISTELLA Rav, PharmD, BCPS Clinical Pharmacist 04/12/2024 7:14 AM

## 2024-04-12 NOTE — Assessment & Plan Note (Signed)
 Continue high intensity statin

## 2024-04-12 NOTE — Plan of Care (Signed)

## 2024-04-13 DIAGNOSIS — J918 Pleural effusion in other conditions classified elsewhere: Secondary | ICD-10-CM | POA: Diagnosis not present

## 2024-04-13 DIAGNOSIS — L89152 Pressure ulcer of sacral region, stage 2: Secondary | ICD-10-CM | POA: Diagnosis not present

## 2024-04-13 DIAGNOSIS — J189 Pneumonia, unspecified organism: Secondary | ICD-10-CM | POA: Diagnosis not present

## 2024-04-13 DIAGNOSIS — R338 Other retention of urine: Secondary | ICD-10-CM | POA: Insufficient documentation

## 2024-04-13 LAB — GLUCOSE, CAPILLARY
Glucose-Capillary: 120 mg/dL — ABNORMAL HIGH (ref 70–99)
Glucose-Capillary: 126 mg/dL — ABNORMAL HIGH (ref 70–99)
Glucose-Capillary: 134 mg/dL — ABNORMAL HIGH (ref 70–99)
Glucose-Capillary: 136 mg/dL — ABNORMAL HIGH (ref 70–99)
Glucose-Capillary: 142 mg/dL — ABNORMAL HIGH (ref 70–99)
Glucose-Capillary: 144 mg/dL — ABNORMAL HIGH (ref 70–99)

## 2024-04-13 LAB — CBC
HCT: 25.2 % — ABNORMAL LOW (ref 36.0–46.0)
Hemoglobin: 8.3 g/dL — ABNORMAL LOW (ref 12.0–15.0)
MCH: 29.5 pg (ref 26.0–34.0)
MCHC: 32.9 g/dL (ref 30.0–36.0)
MCV: 89.7 fL (ref 80.0–100.0)
Platelets: 232 K/uL (ref 150–400)
RBC: 2.81 MIL/uL — ABNORMAL LOW (ref 3.87–5.11)
RDW: 14.5 % (ref 11.5–15.5)
WBC: 17.4 K/uL — ABNORMAL HIGH (ref 4.0–10.5)
nRBC: 0.1 % (ref 0.0–0.2)

## 2024-04-13 LAB — COMPREHENSIVE METABOLIC PANEL WITH GFR
ALT: 6 U/L (ref 0–44)
AST: 15 U/L (ref 15–41)
Albumin: 2.3 g/dL — ABNORMAL LOW (ref 3.5–5.0)
Alkaline Phosphatase: 51 U/L (ref 38–126)
Anion gap: 7 (ref 5–15)
BUN: 17 mg/dL (ref 8–23)
CO2: 26 mmol/L (ref 22–32)
Calcium: 8.2 mg/dL — ABNORMAL LOW (ref 8.9–10.3)
Chloride: 103 mmol/L (ref 98–111)
Creatinine, Ser: 0.55 mg/dL (ref 0.44–1.00)
GFR, Estimated: >60 mL/min (ref >60)
Glucose, Bld: 137 mg/dL — ABNORMAL HIGH (ref 70–99)
Potassium: 3.9 mmol/L (ref 3.5–5.1)
Sodium: 136 mmol/L (ref 135–145)
Total Bilirubin: 0.3 mg/dL (ref 0.0–1.2)
Total Protein: 5.7 g/dL — ABNORMAL LOW (ref 6.5–8.1)

## 2024-04-13 LAB — MAGNESIUM: Magnesium: 1.9 mg/dL (ref 1.7–2.4)

## 2024-04-13 LAB — PHOSPHORUS: Phosphorus: 2.5 mg/dL (ref 2.5–4.6)

## 2024-04-13 LAB — TRIGLYCERIDES: Triglycerides: 47 mg/dL (ref <150)

## 2024-04-13 MED ORDER — KATE FARMS STANDARD 1.4 EN LIQD
325.0000 mL | Freq: Two times a day (BID) | ENTERAL | Status: DC
  Administered 2024-04-13 – 2024-04-22 (×7): 325 mL via ORAL
  Filled 2024-04-13 (×2): qty 325
  Filled 2024-04-13: qty 1000

## 2024-04-13 MED ORDER — TRAVASOL 10 % IV SOLN: INTRAVENOUS | Status: DC

## 2024-04-13 MED ORDER — TRAVASOL 10 % IV SOLN
INTRAVENOUS | Status: AC
  Filled 2024-04-13: qty 826.8

## 2024-04-13 MED ORDER — TAMSULOSIN HCL 0.4 MG PO CAPS
0.4000 mg | ORAL_CAPSULE | Freq: Every day | ORAL | Status: DC
  Administered 2024-04-13 – 2024-04-23 (×11): 0.4 mg via ORAL
  Filled 2024-04-13 (×11): qty 1

## 2024-04-13 MED ORDER — POLYETHYLENE GLYCOL 3350 17 G PO PACK
17.0000 g | PACK | Freq: Every day | ORAL | Status: DC
  Administered 2024-04-13 – 2024-04-17 (×5): 17 g via ORAL
  Filled 2024-04-13 (×6): qty 1

## 2024-04-13 NOTE — Consult Note (Signed)
 WOC Nurse Consult Note: Reason for Consult: Stage 2 Pressure injury  Wound type: Stage 2 Pressure Injury sacrum  Pressure Injury POA: no  Measurement: see nursing flowsheet  Wound bed: pink moist linear  Drainage (amount, consistency, odor) see nursing flowsheet  Periwound: intact  Dressing procedure/placement/frequency: Cleanse sacral wound with Vashe, do not rinse.  Apply a strip of silver hydrofiber Soila [id number]) to wound bed daily and secure with silicone foam or ABD pad and tape whichever is preferred.   POC discussed with bedside nurse. WOC team will not follow Re-consult if further needs arise.   Thank you,    Powell Bar MSN, RN-BC, [employer]

## 2024-04-13 NOTE — Assessment & Plan Note (Signed)
 Continue amlodipine   PRN metoprolol

## 2024-04-13 NOTE — Assessment & Plan Note (Signed)
 Continue high intensity statin

## 2024-04-13 NOTE — Progress Notes (Signed)
 Calorie Count Note  48 hour calorie count ordered.  Diet: Full Liquid Supplements: None  Day 1 (11/23): Breakfast: 100% per patient - grits, orange sherbet, orange juice = 290 kcals, 2g protein Lunch: 0% Dinner: 100% - chicken broth, ginger ale, cranberry juice = 105 kcal. 1 g protein Supplements: None  Total intake: 395 kcal (27% of minimum estimated needs)  3 protein (4% of minimum estimated needs)  Nutrition Dx: Moderate Malnutrition related to chronic illness as evidenced by mild fat depletion, moderate muscle depletion.   Goal: Patient will meet greater than or equal to 90% of their needs   Subjective: Patient moaning in discomfort at time of visit. Patient fairly alert. Admits her appetite is poor. However, reports eating well yesterday and having 100% of breakfast and dinner. Unable to verify breakfast but patient is documented to have had 100% of dinner. However, only having items such as broth and juice mostly. Of note, patient still has not had a BM since admission 15 days ago. Surgery has added a bowel regimen. Patient is noted to have an allergy listed for Ensure (nutritional supplements) with comment of upset stomach. Discussed trying a plant based ONS and patient nodded head in agreement.  Pharmacy found out patient had received intralipids back in 2022 so plan to add these in to TPN tonight. This will increase patient to goal and meet 100% of estimated needs.  Intervention:  48 hour calorie count to continue through today.  - Please document % intake of all foods, drinks, and nutrition supplements patient consumes on meal tickets and place in envelope on patient's door.  - If patient skips/refuses meals please document 0% for that meal.  - Add [employer] 1.4 PO BID, each supplement provides 455 kcal and 20 grams protein.  - Continue TPN - plan to add intralipids today and advance to goal to meet nutritional needs.  - TPN management per pharmacy.   Trude Ned RD, LDN Contact via [employer].

## 2024-04-13 NOTE — Assessment & Plan Note (Signed)
 POA Improved, passed voiding trial Had recurrence and foley was replaced on 11/24 Will start tamsulosin 

## 2024-04-13 NOTE — Assessment & Plan Note (Signed)
Continue protonix daily. 

## 2024-04-13 NOTE — Assessment & Plan Note (Signed)
 K+ 2.4 this AM Repleted Recheck in AM

## 2024-04-13 NOTE — Assessment & Plan Note (Signed)
 RN noted periodic short episodes of bradycardia with sleep No intervention would be appropriate Will continue to monitor on telemetry

## 2024-04-13 NOTE — Progress Notes (Signed)
 Progress Note  10 Days Post-Op  Subjective: No family present.  Had some FLD (ongoing calorie count). Reports that she has not had BM today. Pt denies pain, nausea, vomiting.  ROS  All negative with the exception of above.  Objective: Vital signs in last 24 hours: Temp:  [97.4 F (36.3 C)-97.9 F (36.6 C)] 97.9 F (36.6 C) (11/24 0401) Pulse Rate:  [84-96] 96 (11/24 0401) Resp:  [17-18] 17 (11/24 0401) BP: (118-138)/(84-96) 118/84 (11/24 0401) SpO2:  [95 %-100 %] 95 % (11/24 0854) FiO2 (%):  [21 %] 21 % (11/24 0854) Last BM Date :  (PTA)  Intake/Output from previous day: 11/23 0701 - 11/24 0700 In: 3204.2 [P.O.:470; I.V.:1749.2; IV Piggyback:975] Out: 1530 [Urine:1500; Drains:30] Intake/Output this shift: Total I/O In: 10 [I.V.:10] Out: -   PE: General: Pleasant female who is laying in bed in NAD. HEENT: Head is normocephalic, atraumatic.  Heart: HR normal during encounter.   Lungs: Respiratory effort nonlabored. Abd: Soft with some distention. NT to palpation. Abdominal incision C/D/I with staples. No rebound tenderness or guarding. 2 drains noted of left abdomen. More superior drain is noted to have <25 mL of SS fluid. More inferior drain is noted to have <25 mL of clear/yellowish fluid present.  MS: Able to move all extremities.  Skin: Warm and dry.    Lab Results:  Recent Labs    04/13/24 0245  WBC 17.4*  HGB 8.3*  HCT 25.2*  PLT 232   BMET Recent Labs    04/12/24 0237 04/13/24 0245  NA 134* 136  K 4.2 3.9  CL 104 103  CO2 24 26  GLUCOSE 220* 137*  BUN 16 17  CREATININE 0.52 0.55  CALCIUM  8.0* 8.2*   PT/INR No results for input(s): LABPROT, INR in the last 72 hours. CMP     Component Value Date/Time   NA 136 04/13/2024 0245   K 3.9 04/13/2024 0245   CL 103 04/13/2024 0245   CO2 26 04/13/2024 0245   GLUCOSE 137 (H) 04/13/2024 0245   BUN 17 04/13/2024 0245   CREATININE 0.55 04/13/2024 0245   CREATININE 0.96 12/27/2023 0757    CREATININE 0.86 11/02/2022 1604   CALCIUM  8.2 (L) 04/13/2024 0245   PROT 5.7 (L) 04/13/2024 0245   PROT 7.7 06/07/2017 0940   ALBUMIN  2.3 (L) 04/13/2024 0245   AST 15 04/13/2024 0245   AST 24 12/27/2023 0757   ALT 6 04/13/2024 0245   ALT 15 12/27/2023 0757   ALKPHOS 51 04/13/2024 0245   BILITOT 0.3 04/13/2024 0245   BILITOT 0.7 12/27/2023 0757   GFRNONAA >60 04/13/2024 0245   GFRNONAA 58 (L) 12/27/2023 0757   GFRAA >60 08/15/2017 0012   Lipase     Component Value Date/Time   LIPASE 30 10/09/2023 1042       Studies/Results: No results found.  Anti-infectives: Anti-infectives (From admission, onward)    Start     Dose/Rate Route Frequency Ordered Stop   04/06/24 1300  metroNIDAZOLE  (FLAGYL ) IVPB 500 mg        500 mg 100 mL/hr over 60 Minutes Intravenous Every 12 hours 04/06/24 1217     04/03/24 1330  metroNIDAZOLE  (FLAGYL ) IVPB 500 mg        500 mg 100 mL/hr over 60 Minutes Intravenous On call to O.R. 04/03/24 1242 04/03/24 1507   03/30/24 2200  vancomycin  (VANCOREADY) IVPB 750 mg/150 mL  Status:  Discontinued        750 mg 150 mL/hr over  60 Minutes Intravenous Every 24 hours 03/29/24 1620 04/03/24 1310   03/30/24 0400  ceFEPIme  (MAXIPIME ) 2 g in sodium chloride  0.9 % 100 mL IVPB        2 g 200 mL/hr over 30 Minutes Intravenous Every 12 hours 03/29/24 1620     03/29/24 1530  vancomycin  (VANCOCIN ) IVPB 1000 mg/200 mL premix        1,000 mg 200 mL/hr over 60 Minutes Intravenous  Once 03/29/24 1517 03/30/24 0228   03/29/24 1530  ceFEPIme  (MAXIPIME ) 2 g in sodium chloride  0.9 % 100 mL IVPB        2 g 200 mL/hr over 30 Minutes Intravenous  Once 03/29/24 1518 03/29/24 1724        Assessment/Plan POD 10, s/p ex lap with SBR, Dr. Teresa 11/14 -CT from 11/21 showed fluid collections of abdomen.  -Vitals stable -WBC 17.4 from 34.8. -HGB 8.3 today from 9.8 from 11.3. Continue to monitor. -Output of Lhip JP drain was 15 mL SS and LLQ drain noted to be 15 mL yellow/clear.  Cultures still pending.  -Difficult to tell how much FLD that she has tolerated. Ongoing calorie count.  -Continue PICC and TNA at this time.  -Mobilize as able -Will continue to follow   FEN - FLD/TNA/IVFs per primary VTE - Lovenox  ID - Cefipime, flagyl , monitor WBC and HGB   HTN HLD h/o CVA Alzheimer's disease  Stage III adenocarcinoma of lung GERD Hypothyroidism PCM    LOS: 15 days   I reviewed nursing notes, specialist notes, hospitalist notes, last 24 h vitals and pain scores, last 48 h intake and output, last 24 h labs and trends, and last 24 h imaging results.   Marjorie Carlyon Favre, [facility] Surgery 04/13/2024, 11:07 AM Please see Amion for pager number during day hours 7:00am-4:30pm

## 2024-04-13 NOTE — Plan of Care (Signed)

## 2024-04-13 NOTE — NC FL2 (Addendum)
 [place]  MEDICAID FL2 LEVEL OF CARE FORM     IDENTIFICATION  Patient Name: Angelica [name] Birthdate: 1941/05/16 Sex: female Admission Date (Current Location): 03/29/2024  [facility] and [place] Number:  Producer, Television/film/video and Address:  [facility],  [address] [place], [place] [address]      Provider Number: [id number]  Attending Physician Name and Address:  Barbarann Nest, MD  Relative Name and Phone Number:  Moni, [place] (Daughter)  [phone] (Home Phone)    Current Level of Care: Hospital Recommended Level of Care: Skilled Nursing Facility Prior Approval Number:    Date Approved/Denied:   PASRR Number: [id number]  Discharge Plan: SNF    Current Diagnoses: Patient Active Problem List   Diagnosis Date Noted   Pressure injury of sacral region, stage 2 (HCC) 04/13/2024   Acute urinary retention 04/13/2024   Sinus bradycardia 04/10/2024   Electrolyte abnormality 04/10/2024   Prediabetes 04/08/2024   Pleural effusion associated with pulmonary infection vs. recurrent malignancy and sepsis 03/29/2024   Elevated troponin 03/29/2024   Sepsis (HCC) 03/29/2024   Low back pain 10/13/2023   Arthritis of right knee 06/20/2023   Corn of toe 06/20/2023   Mastodynia of left breast 02/13/2023   Pleural effusion 11/21/2022   Swelling of breast 11/17/2022   Dyspnea 11/14/2022   Iron  deficiency anemia 10/11/2022   Acute respiratory failure with hypoxia (HCC) 10/05/2022   Bilateral pleural effusion 10/05/2022   History of stroke 10/05/2022   Weakness 10/05/2022   Edema 10/05/2022   Alzheimer's disease (HCC) 09/06/2022   Ex-smoker 09/06/2022   Hypothyroidism 09/06/2022   Right leg pain 07/13/2021   Dementia (HCC) 07/13/2021   RLQ abdominal pain 07/13/2021   Anosmia 05/01/2021   Chronic ethmoidal sinusitis 02/14/2021   Debility 02/01/2021   Gait disorder 02/01/2021   Port-A-Cath in place 11/02/2020   Adenocarcinoma of left lung, stage 3 (HCC) 06/22/2020    Lung mass    Pulmonary nodule 05/17/2020   Iron  deficiency 05/03/2020   Swelling of lower extremity 07/30/2019   Ganglion cyst of right foot 01/16/2019   HLD (hyperlipidemia) 01/16/2019   LPRD (laryngopharyngeal reflux disease) 07/25/2018   Constipation 08/19/2017   Hypokalemia 07/09/2017   Asthma, severe persistent, well-controlled (HCC) 06/04/2017   Chronic pansinusitis 06/04/2017   Nasal polyposis 06/04/2017   Osteopenia 05/26/2017   GERD (gastroesophageal reflux disease)    H. pylori infection    Other allergic rhinitis 03/20/2016   Malnutrition of moderate degree 04/11/2015   SBO (small bowel obstruction) (HCC) 04/10/2015   HTN (hypertension) 04/10/2015    Orientation RESPIRATION BLADDER Height & Weight     Self, Place  Normal Incontinent, External catheter Weight: 62.7 kg Height:  5' 5 (165.1 cm)  BEHAVIORAL SYMPTOMS/MOOD NEUROLOGICAL BOWEL NUTRITION STATUS      Continent Diet (full liquids)  AMBULATORY STATUS COMMUNICATION OF NEEDS Skin   Extensive Assist Verbally Other (Comment) (erythema/redness on bilateral arms and bilateral buttocks; abdominal incision w/staples, Stage 2 Sacrum pressure inury-foam lift dressing)                       Personal Care Assistance Level of Assistance  Bathing, Feeding, Dressing Bathing Assistance: Limited assistance Feeding assistance: Limited assistance Dressing Assistance: Limited assistance     Functional Limitations Info  Sight, Hearing, Speech Sight Info: Adequate (reading glasses) Hearing Info: Adequate Speech Info: Adequate    SPECIAL CARE FACTORS FREQUENCY  PT (By licensed PT), OT (By licensed OT)     PT  Frequency: 5x/wk OT Frequency: 5x/wk            Contractures Contractures Info: Not present    Additional Factors Info  Code Status, Allergies, Psychotropic Code Status Info: Full Code Allergies Info: Asa Buff (Mag (Buffered Aspirin), Ensure (Nutritional Supplements), Fish Allergy, Other,  Peanut-containing Drug Products, Penicillins, Shellfish Protein-containing Drug Products, Strawberry Extrac Psychotropic Info: see MAR         Current Medications (04/13/2024):  This is the current hospital active medication list Current Facility-Administered Medications  Medication Dose Route Frequency Provider Last Rate Last Admin   albuterol  (PROVENTIL ) (2.5 MG/3ML) 0.083% nebulizer solution 2.5 mg  2.5 mg Nebulization Q2H PRN Cheryle Page, MD       budesonide  (PULMICORT ) nebulizer solution 0.5 mg  0.5 mg Nebulization BID Cheryle, Kshitiz, MD   0.5 mg at 04/13/24 0854   ceFEPIme  (MAXIPIME ) 2 g in sodium chloride  0.9 % 100 mL IVPB  2 g Intravenous Q12H Tammy Sor, PA-C   Stopped at 04/13/24 9573   Chlorhexidine  Gluconate Cloth 2 % PADS 6 each  6 each Topical Q0600 Cheryle Page, MD   6 each at 04/13/24 0411   enoxaparin  (LOVENOX ) injection 40 mg  40 mg Subcutaneous Q24H Allred, Darrell K, PA-C   40 mg at 04/12/24 1521   feeding supplement (KATE FARMS STANDARD ENT 1.4) liquid 325 mL  325 mL Oral BID BM Barbarann Nest, MD       fluticasone  (FLONASE ) 50 MCG/ACT nasal spray 1 spray  1 spray Each Nare Daily Tammy Sor, PA-C   1 spray at 04/13/24 9046   hydrALAZINE  (APRESOLINE ) injection 5 mg  5 mg Intravenous Q4H PRN Tammy Sor, PA-C       insulin  aspart (novoLOG ) injection 0-15 Units  0-15 Units Subcutaneous Q4H Seabron Ronal HERO, [facility]   2 Units at 04/13/24 1220   ipratropium (ATROVENT ) 0.06 % nasal spray 2 spray  2 spray Each Nare TID Tammy Sor, PA-C   2 spray at 04/13/24 9046   levothyroxine  (SYNTHROID ) tablet 50 mcg  50 mcg Oral QAC breakfast Barbarann Nest, MD   50 mcg at 04/13/24 0547   lip balm (CARMEX) ointment   Topical PRN Tammy Sor, PA-C   1 Application at 04/02/24 1117   methocarbamol  (ROBAXIN ) injection 500 mg  500 mg Intravenous Q8H Tammy Sor, PA-C   500 mg at 04/13/24 1351   metroNIDAZOLE  (FLAGYL ) IVPB 500 mg  500 mg Intravenous Q12H Tammy Sor, PA-C  100 mL/hr at 04/13/24 1001 500 mg at 04/13/24 1001   montelukast  (SINGULAIR ) tablet 10 mg  10 mg Oral QHS Yates, Jennifer, MD   10 mg at 04/12/24 2100   morphine  (PF) 2 MG/ML injection 2 mg  2 mg Intravenous Q2H PRN Tammy Sor, PA-C   2 mg at 04/13/24 1221   Muscle Rub CREA   Topical PRN Barbarann Nest, MD   1 Application at 04/12/24 2317   ondansetron  (ZOFRAN ) tablet 4 mg  4 mg Oral Q6H PRN Tammy Sor, PA-C       Or   ondansetron  (ZOFRAN ) injection 4 mg  4 mg Intravenous Q6H PRN Tammy Sor, PA-C   4 mg at 04/03/24 9082   Oral care mouth rinse  15 mL Mouth Rinse PRN Barbarann Nest, MD       pantoprazole  (PROTONIX ) injection 40 mg  40 mg Intravenous Q12H Tammy Sor, PA-C   40 mg at 04/13/24 0952   polyethylene glycol (MIRALAX  / GLYCOLAX ) packet 17 g  17 g Oral  Daily Eletha Boas, MD   17 g at 04/13/24 1353   pravastatin  (PRAVACHOL ) tablet 80 mg  80 mg Oral Daily Barbarann Nest, MD   80 mg at 04/13/24 0957   sodium chloride  flush (NS) 0.9 % injection 10-40 mL  10-40 mL Intracatheter Q12H Tammy Sor, PA-C   10 mL at 04/13/24 0957   sodium chloride  flush (NS) 0.9 % injection 10-40 mL  10-40 mL Intracatheter PRN Tammy Sor, PA-C       sodium chloride  flush (NS) 0.9 % injection 5 mL  5 mL Intracatheter Q8H Jenna Cordella LABOR, MD   5 mL at 04/13/24 1353   tamsulosin  (FLOMAX ) capsule 0.4 mg  0.4 mg Oral Daily Barbarann Nest, MD   0.4 mg at 04/13/24 1352   TPN ADULT (ION)   Intravenous Continuous TPN Seabron Ronal HERO, RPH 50 mL/hr at 04/13/24 9378 Infusion Verify at 04/13/24 9378   TPN ADULT (ION)   Intravenous Continuous TPN Casimir Camelia RAMAN, [facility]         Discharge Medications: Please see discharge summary for a list of discharge medications.  Relevant Imaging Results:  Relevant Lab Results:   Additional Information SSN: [id number]  Alfonse JONELLE Rex, RN

## 2024-04-13 NOTE — Assessment & Plan Note (Signed)
 Allergy to ASA On plavix  and high intensity statin PTA Per neuro note October 30 2023- She had an MRI scan of the brain done on 07/18/2021 which I personally reviewed and shows only mild changes of small vessel disease and generalized atrophy. I do not see a definite right cerebellar stroke but the radiologist has called 1 which apparently was not seen on previous MRI from 06/14/2020. Patient has since been started on Plavix  by Dr. Norleen   Neuro Dr Rosemarie did not feel it was CVA Plavix  is currently on hold

## 2024-04-13 NOTE — Progress Notes (Addendum)
 Progress Note   Patient: Angelica [name] FMW:[id number] DOB: September 15, 1940 DOA: 03/29/2024     15 DOS: the patient was seen and examined on 04/13/2024   Brief hospital course: 83yo with h/o GERD, HTN, HLD, IDA, asthma, stage 3b adenocarcinoma of the lung, hypothyroidism, CVA, Alzheimer's dementia, and hx of SBO due to adhesions in 2016 and 2021 who presented on 11/9 with weakness and breast swelling. She was noted to have leukocytosis with lactate of 2.1.  CXR with near-complete opacification of the left lung, differential includes large malignant pleural effusion, central hilar mass with obstruction, lobar atelectasis, and/or pneumonia.  CT A/P with large highly located left pleural effusion with compressive atelectasis throughout the left lung.  She was started on antibiotics, pulmonary consulted.  11/11 also found to have SBO, NG tube placed, surgery consulted.  Underwent lysis of adhesions with small bowel resection on 11/14.  On TPN.    Assessment & Plan Pleural effusion associated with pulmonary infection vs. recurrent malignancy and sepsis Presenting with weakness, cough  PCCM was consulting, now signed off Blood culture negative On room air Continue broad-spectrum antibiotics w/ vancomycin  and cefepime   Chest tube placed 11/12, removed 11/15 Fluid studies suggest possible infectious etiology due to elevated WBC count, elevated LDH, elevated protein fluid but culture negative Marked leukocytosis 11/20; see below re: abdomen but CT chest relatively unremarkable SBO (small bowel obstruction) (HCC) H/o prior SBO x 2 Patient complained of stomach upset and vomited 11/11; x-ray concerning for SBO  Surgery consulted NG tube inserted Repeat CT abdomen 11/11 small bowel obstruction with proximal transition point just beyond the ligament of Treitz Underwent ex lap with lysis of adhesion and small bowel resection on 11/14 Started on TPN Marked leukocytosis 11/20 with reported significant  abdominal pain CT showed interval partial SBO with resolution of previously noted SBO with oral contrast into the distal transverse colon Also showed loculated intraperitoneal abscess in LLQ 1.8 x 3.8 x 10.5 cm IR placed 2 CT-guided perc drains on 11/21; cultures NTD Advanced to CLD on 11/22, full liquids on 11/23 Calorie count ongoing Weakness Likely secondary to above Fall precautions PT/OT recommending STR Starting calorie count Will need to be able to maintain nutritional status without TPN in order to transition to STR Adenocarcinoma of left lung, stage 3 (HCC) Diagnosed in 05/2020 S/p concurrent chemoradiation with weekly carboplatin  for AUC of 2 and paclitaxel  45 mg/M2. S/P 5 X -2022 S/p Consolidation treatment with immunotherapy with Imfinzi  1500 mg IV q 4 wk x 3 cycles - discontinued 2/2 intolerance/sepsis Currently on observation Pleural fluid cytology was negative from 11/12 HTN (hypertension) Continue amlodipine   PRN metoprolol Elevated troponin Flat and mildly elevated troponin 27>28 No chest pain or palpitations, no changes on EKG  Related to demand ischemia in the setting of sepsis HLD (hyperlipidemia) Continue high intensity statin  History of stroke Allergy to ASA On plavix  and high intensity statin PTA Per neuro note October 30 2023- She had an MRI scan of the brain done on 07/18/2021 which I personally reviewed and shows only mild changes of small vessel disease and generalized atrophy. I do not see a definite right cerebellar stroke but the radiologist has called 1 which apparently was not seen on previous MRI from 06/14/2020. Patient has since been started on Plavix  by Dr. Norleen   Neuro Dr Rosemarie did not feel it was CVA Plavix  is currently on hold Hypothyroidism Normal TSH, mildly elevated free T4 Continue synthroid  50mcg daily  Asthma, severe persistent, well-controlled (  HCC) No signs of exacerbation  Continue bronchodilators Alzheimer's disease (HCC) At  baseline Continue namenda  Delirium precautions  GERD (gastroesophageal reflux disease) Continue protonix  daily  Malnutrition of moderate degree Nutrition Problem: Moderate Malnutrition Etiology: chronic illness Signs/Symptoms: mild fat depletion, moderate muscle depletion Interventions: Refer to RD note for recommendations, TPN Hypokalemia K+ 2.4 this AM Repleted Recheck in AM Prediabetes A1c 5.9 Will cover with sensitive-scale SSI Sinus bradycardia RN noted periodic short episodes of bradycardia with sleep No intervention would be appropriate Will continue to monitor on telemetry Electrolyte abnormality Replete K+, Mag++, Phos as needed Pressure injury of sacral region, stage 2 (HCC) Wound 04/12/24 2315 Pressure Injury Sacrum Medial Stage 2 -  Partial thickness loss of dermis presenting as a shallow open injury with a red, pink wound bed without slough. (Active)  Not present on admission Acute urinary retention POA Improved, passed voiding trial Had recurrence and foley was replaced on 11/24 Will start tamsulosin        Consultants: Pulmonology Surgery IR Palliative PT OT Dietician [facility] team   Procedures: Chest tube insertion 11/12 Ex lap, lysis of adhesions, small bowel resection 11/14 CT-guided perc drain placement 11/21   Antibiotics: Cefepime  11/9- Metronidazole  11/14- Vancomycin  11/9-14    30 Day Unplanned Readmission Risk Score    Flowsheet Row ED to Hosp-Admission (Current) from 03/29/2024 in [facility]  30 Day Unplanned Readmission Risk Score (%) 30.24 Filed at 04/13/2024 0401    This score is the patient's risk of an unplanned readmission within 30 days of being discharged (0 -100%). The score is based on dignosis, age, lab data, medications, orders, and past utilization.   Low:  0-14.9   Medium: 15-21.9   High: 22-29.9   Extreme: 30 and above           Subjective: Somnolent today, no apparent  complaints.   Objective: Vitals:   04/12/24 2008 04/13/24 0401  BP:  118/84  Pulse:  96  Resp:  17  Temp:  97.9 F (36.6 C)  SpO2: 99% 100%    Intake/Output Summary (Last 24 hours) at 04/13/2024 0708 Last data filed at 04/13/2024 9378 Gross per 24 hour  Intake 3204.18 ml  Output 1530 ml  Net 1674.18 ml   Filed Weights   04/03/24 1327 04/07/24 0400 04/09/24 0524  Weight: 59.6 kg 58.8 kg 62.7 kg    Exam:  General:  Appears calm and comfortable and is in NAD Eyes:  normal lids, iris ENT:  grossly normal hearing, lips & tongue, mmm Cardiovascular:  RRR. No LE edema.  Respiratory:   CTA bilaterally with no wheezes/rales/rhonchi.  Normal respiratory effort. Abdomen:  midline dressing C/D/I; 2 L-sided drains in place without significant output at the time of my evaluation Skin:  no rash or induration seen on limited exam Musculoskeletal:  generalized weakness, no bony abnormality Psychiatric:  somnolent mood and affect, speech sparse today Neurologic:  sleeping comfortably so this was deferred  Data Reviewed: I have reviewed the patient's lab results since admission.  Pertinent labs for today include:   Glucose 137 Albumin  2.3 WBC 17.4 Hgb 8.3     Family Communication: None present  Mobility: PT/OT Consulted and are recommending - Skilled Nursing-Short Term Rehab (<3 Hours/Day)04/09/2024 1453    Code Status: Full Code  Barriers to discharge:  On TPN, will need placement (needs FL2 sent)  Disposition: Status is: Inpatient Remains inpatient appropriate because: will need placement, ongoing management     Time spent: 50 minutes  Unresulted  Labs (From admission, onward)     Start     Ordered   04/13/24 0500  Triglycerides  (TPN Lab Panel)  Every Monday (0500),   R     Question:  Specimen collection method  Answer:  Lab=Lab collect   04/07/24 1027   04/09/24 0500  Comprehensive metabolic panel  (TPN Lab Panel)  Every Mon,Thu (0500),   R     Question:   Specimen collection method  Answer:  Lab=Lab collect   04/07/24 1027   04/09/24 0500  Magnesium   (TPN Lab Panel)  Every Mon,Thu (0500),   R     Question:  Specimen collection method  Answer:  Lab=Lab collect   04/07/24 1027   04/09/24 0500  Phosphorus  (TPN Lab Panel)  Every Mon,Thu (0500),   R     Question:  Specimen collection method  Answer:  Lab=Lab collect   04/07/24 1027             Author: Delon Herald, MD 04/13/2024 7:08 AM  For on call review [email or web address].

## 2024-04-13 NOTE — Assessment & Plan Note (Addendum)
 Wound 04/12/24 2315 Pressure Injury Sacrum Medial Stage 2 -  Partial thickness loss of dermis presenting as a shallow open injury with a red, pink wound bed without slough. (Active)  Not present on admission

## 2024-04-13 NOTE — Assessment & Plan Note (Signed)
 Flat and mildly elevated troponin 27>28 No chest pain or palpitations, no changes on EKG  Related to demand ischemia in the setting of sepsis

## 2024-04-13 NOTE — Progress Notes (Signed)
 PHARMACY - TOTAL PARENTERAL NUTRITION CONSULT NOTE   Indication: Prolonged ileus  Patient Measurements: Height: 5' 5 (165.1 cm) Weight: 62.7 kg (138 lb 3.7 oz) IBW/kg (Calculated) : 57 TPN AdjBW (KG): 59.6 Body mass index is 23 kg/m.  Assessment: Presented with abdominal pain and weakness - 11/14: Ex lap with LOA, SBR and repair - Prolonged post-op ileus.  Glucose / Insulin : No h/o DM. A1c 5.9% -BG goal <180. BG range: 103-158 last 24h, -SSI given: 9 units  Electrolytes: -Na now WNL on max concentration in TPN -All other electrolytes WNL, including K 3.9, Mg 1.9, Phos 2.5, CorrCa (9.5) -Goal electrolytes with ileus: K >/= 4, Mg >/=2  Renal: Scr <1, BUN WNL and stable  Hepatic: LFTs, Alk Phos, T.bili all WNL. Albumin  low - Triglycerides 47  Intake / Output; MIVF:  -mIVF: None -po: (FLD) None charted 11/23 -UOP: 1500 mL,  -Drains: none charted on 11/23  GI Imaging: 11/11 CT: SBO 11/13: AbdXR: SBO 11/14: AbdXR: SBO  GI Surgeries / Procedures:  -11/14: Ex lap with LOA, SBR and repair -11/21: IR placement of drains for abdominal abscesses  Central access: Double lumen PICC placed 11/18 TPN start date:  04/07/24  Nutritional Goals: Goal TPN rate is 65 mL/hr (provides 83 g of protein and 1446 kcals per day)  RD Assessment: Estimated Needs Total Energy Estimated Needs: 1450-1600 kcals Total Protein Estimated Needs: 70-85 grams Total Fluid Estimated Needs: >/= 1.5L  Current Nutrition:  TPN 11/23: FLD  Lipid recommendations in TPN: - Peanut allergy: Use SMOFlipid with potential cross reactivity risk with soybean - Fish Allergy: (non-anaphylactic): Use Intralipid  - Shellfish Allergy (non-anaphylactic): If fish allergy confirmed or the patient avoids all seafood, Use Intralipid   - For the short-term (< 7-10 days), benefits/risks can be discussed with patient or intravenous fat emulsions can be held  --patient did receive Intralipid  in January 2022.  Plan:    At 18:00: Increase TPN to goal 65 mL/hr  Add Intralipid  (received in 2022) Will provide 82g protein and 1597 kcal Electrolytes in TPN: No changes Na 150 mEq/L, K 50 mEq/L, Ca 5 mEq/L, Mg 5 mEq/L, and Phos 15 mmol/L.  Cl:Ac 1:2 Add standard MVI and trace elements to TPN Continue moderate SSI q4h and adjust as needed  Monitor TPN labs on Mon/Thurs and PRN Calorie count  Casimir Camelia Ours, PharmD, BCPS Clinical Pharmacist 04/13/2024 7:18 AM

## 2024-04-13 NOTE — TOC Progression Note (Signed)
 Transition of Care [facility]) - Progression Note    Patient Details  Name: Angelica [name] MRN: [id number] Date of Birth: 1940/11/20  [facility] of [facility]) CM/SW Contact  Alfonse JONELLE Rex, RN Phone Number: 04/13/2024, 1:54 PM  Clinical Narrative:   Call to patient's daughter, Larry, introduced self and role of INPT CM. PT recommendation for short term rehab/SNF, Taron agreeable, prefers [place] (1st  choice), [place] ( 2nd choice). FL2 updated, faxed out for bed offers.     Expected Discharge Plan: Skilled Nursing Facility Barriers to Discharge: Continued Medical Work up               Expected Discharge Plan and Services In-house Referral: Clinical Social Work   Post Acute Care Choice: Home Health Living arrangements for the past 2 months: Single Family Home                 DME Arranged: N/A DME Agency: NA                   Social Drivers of Health (SDOH) Interventions SDOH Screenings   Food Insecurity: No Food Insecurity (03/29/2024)  Housing: Low Risk  (03/29/2024)  Transportation Needs: No Transportation Needs (03/29/2024)  Utilities: Not At Risk (03/29/2024)  Alcohol Screen: Low Risk  (08/20/2023)  Depression (PHQ2-9): Low Risk  (12/17/2023)  Financial Resource Strain: Low Risk  (12/15/2023)  Physical Activity: Inactive (12/15/2023)  Social Connections: Moderately Integrated (12/15/2023)  Stress: No Stress Concern Present (12/15/2023)  Tobacco Use: Medium Risk (04/03/2024)  Health Literacy: Adequate Health Literacy (08/20/2023)    Readmission Risk Interventions    10/08/2022    2:08 PM  Readmission Risk Prevention Plan  Transportation Screening Complete  PCP or Specialist Appt within 5-7 Days Complete  Home Care Screening Complete  Medication Review (RN CM) Complete

## 2024-04-13 NOTE — Assessment & Plan Note (Signed)
 Nutrition Problem: Moderate Malnutrition Etiology: chronic illness Signs/Symptoms: mild fat depletion, moderate muscle depletion Interventions: Refer to RD note for recommendations, TPN

## 2024-04-13 NOTE — Assessment & Plan Note (Signed)
 Presenting with weakness, cough  PCCM was consulting, now signed off Blood culture negative On room air Continue broad-spectrum antibiotics w/ vancomycin  and cefepime   Chest tube placed 11/12, removed 11/15 Fluid studies suggest possible infectious etiology due to elevated WBC count, elevated LDH, elevated protein fluid but culture negative Marked leukocytosis 11/20; see below re: abdomen but CT chest relatively unremarkable

## 2024-04-13 NOTE — TOC PASRR Note (Signed)
 30 Day PASRR Note   Patient Details  Name: Angelica [name] Date of Birth: [birth year]   Transition of Care [facility]) CM/SW Contact:    Alfonse JONELLE Rex, RN Phone Number: 04/13/2024, 2:10 PM  To Whom It May Concern:  Please be advised that this patient will require a short-term nursing home stay - anticipated 30 days or less for rehabilitation and strengthening.   The plan is for return home.

## 2024-04-13 NOTE — Assessment & Plan Note (Signed)
 Replete K+, Mag++, Phos as needed

## 2024-04-13 NOTE — Progress Notes (Signed)
 SLP Cancellation Note  Patient Details Name: Angelica [name] MRN: [id number] DOB: 1941/03/12   Cancelled treatment:       Reason Eval/Treat Not Completed: Pain limiting ability to participate Patient experiencing abdominal pain. RN in room providing pain medication. SLP will continue to follow. SLP secure messaged chat with surgery PA who advised continue full liquids.   Cloa Bushong  Graduate SLP Clinican

## 2024-04-13 NOTE — Assessment & Plan Note (Signed)
 No signs of exacerbation  Continue bronchodilators

## 2024-04-13 NOTE — Assessment & Plan Note (Signed)
 Normal TSH, mildly elevated free T4 Continue synthroid  50mcg daily

## 2024-04-13 NOTE — Assessment & Plan Note (Signed)
 Likely secondary to above Fall precautions PT/OT recommending STR Starting calorie count Will need to be able to maintain nutritional status without TPN in order to transition to STR

## 2024-04-13 NOTE — Assessment & Plan Note (Addendum)
 H/o prior SBO x 2 Patient complained of stomach upset and vomited 11/11; x-ray concerning for SBO  Surgery consulted NG tube inserted Repeat CT abdomen 11/11 small bowel obstruction with proximal transition point just beyond the ligament of Treitz Underwent ex lap with lysis of adhesion and small bowel resection on 11/14 Started on TPN Marked leukocytosis 11/20 with reported significant abdominal pain CT showed interval partial SBO with resolution of previously noted SBO with oral contrast into the distal transverse colon Also showed loculated intraperitoneal abscess in LLQ 1.8 x 3.8 x 10.5 cm IR placed 2 CT-guided perc drains on 11/21; cultures NTD Advanced to CLD on 11/22, full liquids on 11/23 Calorie count ongoing

## 2024-04-13 NOTE — Progress Notes (Signed)
 Referring Physician(s): Wilson,E  Supervising Physician: Jenna Hacker  Patient Status:  [facility], [place] - In-pt  Chief Complaint: Abdominal pain, Post op abdominal fluid collections; s/p LUQ/LLQ fluid collection drains 11/21   Subjective: Pt c/o pain midline abd wound site, afebrile, WBC 17.4(34.8), daughter in room   Allergies: Asa buff (mag [buffered aspirin], Ensure [nutritional supplements], Fish allergy, Other, Peanut-containing drug products, Penicillins, Shellfish protein-containing drug products, and Strawberry extract  Medications: Prior to Admission medications   Medication Sig Start Date End Date Taking? Authorizing Provider  acetaminophen  (TYLENOL ) 500 MG tablet Take 1,000 mg by mouth every 6 (six) hours as needed for moderate pain.   Yes [provider]  albuterol  (VENTOLIN  HFA) 108 (90 Base) MCG/ACT inhaler Inhale 2 puffs into the lungs every 4 (four) hours as needed for wheezing or shortness of breath. 01/03/24  Yes Padgett, Danita Macintosh, MD  alum & mag hydroxide-simeth (MAALOX/MYLANTA) 200-200-20 MG/5ML suspension Take 15 mLs by mouth every 6 (six) hours as needed for indigestion or heartburn.   Yes [provider]  amLODipine  (NORVASC ) 10 MG tablet Take 1 tablet (10 mg total) by mouth daily. 06/17/23  Yes Norleen Lynwood ORN, MD  benralizumab  (FASENRA ) 30 MG/ML prefilled syringe Inject 1 mL (30 mg total) into the skin every 8 (eight) weeks. 07/15/23  Yes Padgett, Danita Macintosh, MD  cetirizine  [facility] ALLERGY RELIEF, CETIRIZINE ,) 10 MG tablet Take 1 tablet (10 mg total) by mouth daily. 01/03/24  Yes Jeneal Danita Macintosh, MD  clopidogrel  (PLAVIX ) 75 MG tablet Take 1 tablet by mouth once daily 07/18/23  Yes Norleen Lynwood ORN, MD  diclofenac  Sodium (VOLTAREN ) 1 % GEL Apply 4 g topically 4 (four) times daily. Patient taking differently: Apply 4 g topically 4 (four) times daily as needed (pain). 04/23/22  Yes Corey, Evan S, MD  EPINEPHrine  (EPIPEN  2-PAK) 0.3 mg/0.3 mL  IJ SOAJ injection Use as directed for severe allergic reaction 01/03/24  Yes Padgett, Danita Macintosh, MD  fluticasone  (FLONASE ) 50 MCG/ACT nasal spray USE 1 SPRAY(S) IN EACH NOSTRIL IN THE MORNING AND AT BEDTIME Patient taking differently: Place 1 spray into both nostrils daily. 01/14/24  Yes Jeneal Danita Macintosh, MD  ipratropium (ATROVENT ) 0.06 % nasal spray Place 2 sprays into both nostrils 3 (three) times daily as needed for rhinitis. 01/03/24  Yes Padgett, Danita Macintosh, MD  levothyroxine  (SYNTHROID ) 50 MCG tablet TAKE 1 TABLET BY MOUTH ONCE DAILY BEFORE BREAKFAST 03/10/24  Yes Norleen Lynwood ORN, MD  memantine  (NAMENDA ) 10 MG tablet Take 1 tablet (10 mg total) by mouth 2 (two) times daily. 10/30/23  Yes McCue, Harlene, NP  montelukast  (SINGULAIR ) 10 MG tablet Take 1 tablet (10 mg total) by mouth at bedtime. 01/03/24  Yes Padgett, Danita Macintosh, MD  ondansetron  (ZOFRAN -ODT) 4 MG disintegrating tablet Take 1 tablet (4 mg total) by mouth every 8 (eight) hours as needed for nausea or vomiting. 07/09/22  Yes Merlynn Eland F, FNP  SYMBICORT  80-4.5 MCG/ACT inhaler INHALE 2 PUFFS BY MOUTH IN THE MORNING 01/03/24  Yes Padgett, Danita Macintosh, MD  gentamicin  ointment (GARAMYCIN ) 0.1 % Apply 1 Application topically daily. Apply to wound daily between toes.  Use a very small amount. Patient not taking: Reported on 03/10/2024 08/27/23   McCaughan, Dia D, DPM  Magnesium  Hydroxide (PHILLIPS MILK OF MAGNESIA PO) Take 15-30 mLs by mouth daily as needed (for constipation).    [provider]  pantoprazole  (PROTONIX ) 40 MG tablet Take 1 tablet by mouth once daily 03/31/24   Norleen Lynwood ORN,  MD  potassium chloride  SA (KLOR-CON  M) 20 MEQ tablet Take 2 tablets (40 mEq total) by mouth daily as needed. Take potasium days you take lasix . Patient not taking: Reported on 03/10/2024 01/03/23   Norleen Lynwood ORN, MD  pravastatin  (PRAVACHOL ) 80 MG tablet Take 1 tablet by mouth once daily 04/09/24   Norleen Lynwood ORN, MD      Vital Signs: BP 118/84 (BP Location: Left Arm)   Pulse 96   Temp 97.9 F (36.6 C) (Oral)   Resp 17   Ht 5' 5 (1.651 m)   Wt 138 lb 3.7 oz (62.7 kg)   SpO2 95%   BMI 23.00 kg/m   Physical Exam: awake/answers simple questions ok; having pain at abd midline wound site, overlying dressing clean/dry; LUQ/LLQ drains intact, OP about 5-10 cc each serous to blood-tinged fluid, both drains flushed with minimal return  Imaging: CT GUIDED PERITONEAL/RETROPERITONEAL FLUID DRAIN BY [facility] CATH Result Date: 04/10/2024 INDICATION: Postoperative abdominal abscesses. EXAM: CT-guided drain placement TECHNIQUE: Multidetector CT imaging of the abdomen was performed following the standard protocol without IV contrast. RADIATION DOSE REDUCTION: This exam was performed according to the departmental dose-optimization program which includes automated exposure control, adjustment of the mA and/or kV according to patient size and/or use of iterative reconstruction technique. MEDICATIONS: The patient is currently admitted to the hospital and receiving intravenous antibiotics. The antibiotics were administered within an appropriate time frame prior to the initiation of the procedure. ANESTHESIA/SEDATION: Moderate (conscious) sedation was employed during this procedure. A total of Versed  1 mg and Fentanyl  50 mcg was administered intravenously by the radiology nurse. Total intra-service moderate Sedation Time: 1. minutes. The patient's level of consciousness and vital signs were monitored continuously by radiology nursing throughout the procedure under my direct supervision. COMPLICATIONS: None immediate. PROCEDURE: Informed written consent was obtained from the patient's daughter/POA after a thorough discussion of the procedural risks, benefits and alternatives. All questions were addressed. Maximal Sterile Barrier Technique was utilized including caps, mask, sterile gowns, sterile gloves, sterile drape, hand hygiene and  skin antiseptic. A timeout was performed prior to the initiation of the procedure. Initial axial images of the abdomen redemonstrated 2 fluid collections highly suspicious for abscesses in this postoperative patient. Left upper quadrant and more midline left lower quadrant. Radiopaque markers placed on the patient's skin and both regions were marked, prepped, and draped in the usual sterile fashion. Local anesthesia was achieved with 1% lidocaine . A 7 cm Yueh needle was advanced through a small incision in the left upper quadrant with repeat CT imaging until the needle tip was identified within the fluid collection. The needle was then withdrawn leaving the sheath in position. A short Amplatz wire was then advanced into the fluid collection. The access site was dilated with a 10 French dilator and then a 10 French pigtail catheter was coiled within the fluid collection. A small sample was obtained and capped for further laboratory evaluation. The catheter was then connected to a JP bulb. Retention suture and sterile dressing applied. A 7 cm Yueh needle was advanced through a small incision in the left lower quadrant with repeat CT imaging until the needle tip was identified within the fluid collection. The needle was then withdrawn leaving the sheath in position. A short Amplatz wire was then advanced into the fluid collection. The access site was dilated with a 10 French dilator and then a 10 French pigtail catheter was coiled within the fluid collection. A small sample was obtained and capped for  further laboratory evaluation. The catheter was then connected to a JP bulb. Retention suture and sterile dressing applied. IMPRESSION: Satisfactory placement of left upper quadrant and left lower quadrant abscess drains. The fluid collections appear to be infected hematomas. Both catheter is connected to JP bulbs with instructions for flushing placed in the patient's chart. Sample sent to laboratory for culture and  sensitivity. Electronically Signed   By: Cordella Banner   On: 04/10/2024 16:51   CT GUIDED PERITONEAL/RETROPERITONEAL FLUID DRAIN BY [facility] CATH Result Date: 04/10/2024 INDICATION: Postoperative abdominal abscesses. EXAM: CT-guided drain placement TECHNIQUE: Multidetector CT imaging of the abdomen was performed following the standard protocol without IV contrast. RADIATION DOSE REDUCTION: This exam was performed according to the departmental dose-optimization program which includes automated exposure control, adjustment of the mA and/or kV according to patient size and/or use of iterative reconstruction technique. MEDICATIONS: The patient is currently admitted to the hospital and receiving intravenous antibiotics. The antibiotics were administered within an appropriate time frame prior to the initiation of the procedure. ANESTHESIA/SEDATION: Moderate (conscious) sedation was employed during this procedure. A total of Versed  1 mg and Fentanyl  50 mcg was administered intravenously by the radiology nurse. Total intra-service moderate Sedation Time: 1. minutes. The patient's level of consciousness and vital signs were monitored continuously by radiology nursing throughout the procedure under my direct supervision. COMPLICATIONS: None immediate. PROCEDURE: Informed written consent was obtained from the patient's daughter/POA after a thorough discussion of the procedural risks, benefits and alternatives. All questions were addressed. Maximal Sterile Barrier Technique was utilized including caps, mask, sterile gowns, sterile gloves, sterile drape, hand hygiene and skin antiseptic. A timeout was performed prior to the initiation of the procedure. Initial axial images of the abdomen redemonstrated 2 fluid collections highly suspicious for abscesses in this postoperative patient. Left upper quadrant and more midline left lower quadrant. Radiopaque markers placed on the patient's skin and both regions were marked, prepped,  and draped in the usual sterile fashion. Local anesthesia was achieved with 1% lidocaine . A 7 cm Yueh needle was advanced through a small incision in the left upper quadrant with repeat CT imaging until the needle tip was identified within the fluid collection. The needle was then withdrawn leaving the sheath in position. A short Amplatz wire was then advanced into the fluid collection. The access site was dilated with a 10 French dilator and then a 10 French pigtail catheter was coiled within the fluid collection. A small sample was obtained and capped for further laboratory evaluation. The catheter was then connected to a JP bulb. Retention suture and sterile dressing applied. A 7 cm Yueh needle was advanced through a small incision in the left lower quadrant with repeat CT imaging until the needle tip was identified within the fluid collection. The needle was then withdrawn leaving the sheath in position. A short Amplatz wire was then advanced into the fluid collection. The access site was dilated with a 10 French dilator and then a 10 French pigtail catheter was coiled within the fluid collection. A small sample was obtained and capped for further laboratory evaluation. The catheter was then connected to a JP bulb. Retention suture and sterile dressing applied. IMPRESSION: Satisfactory placement of left upper quadrant and left lower quadrant abscess drains. The fluid collections appear to be infected hematomas. Both catheter is connected to JP bulbs with instructions for flushing placed in the patient's chart. Sample sent to laboratory for culture and sensitivity. Electronically Signed   By: Cordella Banner  On: 04/10/2024 16:51   CT CHEST ABDOMEN PELVIS W CONTRAST Result Date: 04/10/2024 EXAM: CT CHEST ABDOMEN PELVIS WITH THORACIC AND LUMBAR SPINE RECONSTRUCTIONS 04/10/2024 12:44:37 AM TECHNIQUE: CT of the chest, abdomen, pelvis was performed after the administration of 100 mL of iohexol  (OMNIPAQUE ) 300  MG/ML solution. Multiplanar reformatted images are provided for review, including reconstructed images of the thoracic and lumbar spine. Automated exposure control, iterative reconstruction, and/or weight based adjustment of the mA/kV was utilized to reduce the radiation dose to as low as reasonably achievable. COMPARISON: 03/31/2024 and 03/29/2024 CLINICAL HISTORY: Leukocystosis, stage III lung cancer, recent pleural effusion with chest tube, recent ex lap with SBR for SBO. *tracking code: Bo* FINDINGS: CT CHEST: THORACIC AORTA: Mild atherosclerotic calcification within the thoracic aorta. No aortic aneurysm. No acute traumatic injury of the aorta. MEDIASTINUM: Extensive multivessel coronary artery calcification. Global cardiac size within normal limits. No pericardial effusion. Right upper extremity PICC line tip seen within the superior vena cava. 3.8 cm left thyroid  nodule, not well characterized on this examination. Nasogastric tube tip seen within the distal body of the stomach. Esophagus unremarkable. No pathologic thoracic adenopathy. No mediastinal hematoma or pneumomediastinum. No acute traumatic injury to the heart or pericardium. The central airways are clear. LUNGS: Small dependent right pleural effusion has increased since prior examination. Loculated left pleural effusion noted on prior examination has been largely evacuated though a residual posterior collection is seen measuring 2.1 x 6.1 x 5.0 cm (volume 33.4 cm3). Small adjacent free flowing pleural effusion is present. Subtotal collapse of the left lower lobe. Band-like atelectasis within the left upper lobe. No superimposed confluent pulmonary infiltrate. Compressive atelectasis of the right lower lobe. No pneumothorax. No central obstructing lesion. No acute traumatic injury to the lungs. No pulmonary contusion or laceration. CHEST WALL: No acute displaced rib fracture. No chest wall hematoma. CT ABDOMEN AND PELVIS: ABDOMINAL AORTA: Mild  aortoiliac atherosclerotic calcification. No acute traumatic injury of the aorta or iliac arteries. HEPATOBILIARY: No acute traumatic injury. SPLEEN: No acute traumatic injury. PANCREAS: No acute traumatic injury. ADRENAL GLANDS: No acute traumatic injury. KIDNEYS: No acute traumatic injury. No hydronephrosis. GI TRACT: Interval partial small bowel resection with resolution of the previously noted small bowel obstruction. Oral contrast now extends into the distal transverse colon. Adjacent to the anastomosis is a loculated, rim enhancing intraperitoneal fluid collection measuring 3.1 x 3.7 x 8.3 cm (volume 49.7 cm3) concerning for a postoperative abscess. Second similar loculated, rim enhancing peritoneal fluid collection seen anteriorly within the left lower quadrant measuring 1.8 x 3.8 x 10.5 cm (volume 37.3 cm3). PERITONEUM: Loculated, rim enhancing intraperitoneal fluid collections as described in the GI tract section. No free intraperitoneal gas. RETROPERITONEUM: No retroperitoneal hematoma. BLADDER: Foley catheter balloon seen within the incompletely decompressed bladder lumen. REPRODUCTIVE ORGANS: Uterus absent. No adnexal masses. BONES: Osseous structures are age appropriate. No acute bone abnormality. No lytic or blastic bone lesion. No acute traumatic fracture of the pelvis. THORACIC AND LUMBAR SPINE: BONES AND ALIGNMENT: No traumatic fracture or traumatic malalignment. DEGENERATIVE CHANGES: No severe spinal canal stenosis or bony neural foraminal narrowing. SOFT TISSUES: No paraspinal mass or hematoma. IMPRESSION: 1. Interval partial small bowel resection with resolution of the previously noted small bowel obstruction. Oral contrast now extends into the distal transverse colon. 2. Loculated, rim enhancing intraperitoneal fluid collection adjacent to the anastomosis measuring 3.1 x 3.7 x 8.3 cm (approximate volume 49 mL), concerning for a postoperative abscess. 3. Second similar loculated, rim enhancing  peritoneal fluid collection  in the left lower quadrant measuring 1.8 x 3.8 x 10.5 cm (approximate volume 37 mL). 4. Small dependent right pleural effusion, increased since prior examination. 5. Residual posterior loculated left pleural effusion measuring 2.1 x 6.1 x 5.0 cm (approximate volume 33 mL), with small adjacent free flowing pleural effusion. Electronically signed by: Dorethia Molt MD 04/10/2024 04:44 AM EST RP Workstation: [id number]    Labs:  CBC: Recent Labs    04/08/24 0425 04/09/24 1039 04/10/24 1023 04/13/24 0245  WBC 19.8* 38.1* 34.8* 17.4*  HGB 10.7* 11.3* 9.8* 8.3*  HCT 32.5* 33.7* 29.4* 25.2*  PLT 274 249 203 232    COAGS: Recent Labs    03/29/24 1835  INR 1.2    BMP: Recent Labs    04/10/24 1023 04/11/24 0335 04/12/24 0237 04/13/24 0245  NA 134* 135 134* 136  K 3.7 3.9 4.2 3.9  CL 105 106 104 103  CO2 22 23 24 26   GLUCOSE 139* 150* 220* 137*  BUN 15 17 16 17   CALCIUM  7.9* 8.0* 8.0* 8.2*  CREATININE 0.51 0.49 0.52 0.55  GFRNONAA >60 >60 >60 >60    LIVER FUNCTION TESTS: Recent Labs    04/06/24 0853 04/08/24 0425 04/09/24 1039 04/13/24 0245  BILITOT 0.3 0.2 0.5 0.3  AST 21 15 17 15   ALT 6 5 <5 6  ALKPHOS 86 59 58 51  PROT 6.3* 5.8* 5.9* 5.7*  ALBUMIN  2.3* 2.1* 2.1* 2.3*    Assessment and Plan: 83 yo female with PMH sig for GERD, HTN, HLD, IDA, asthma, stage 3b adenocarcinoma of the lung, hypothyroidism, CVA, Alzheimer's dementia, and hx of SBO due to adhesions in 2016 and 2021 who presented on 03/29/24 with weakness and breast swelling. She was noted to have leukocytosis with lactate of 2.1. CXR with near-complete opacification of the left lung, differential includes large malignant pleural effusion, central hilar mass with obstruction, lobar atelectasis, and/or pneumonia. CT A/P with large highly located left pleural effusion with compressive atelectasis throughout the left lung. She was started on antibiotics, pulmonary consulted. Had chest  tube placed but since removed.  11/11 also found to have SBO, NG tube placed, surgery consulted. Underwent lysis of adhesions with small bowel resection on 11/14. Now with left sided abd fluid collections , s/p LUQ/LLQ drains 11/21 (10 fr to JP); afebrile, WBC 17.4(34.8), hgb 8.3(9.8), drain fl cx pend; cont current tx, pain meds per primary team; if drain outputs cont to remain low over next 24 -48 hrs consider f/u CT to assess adequacy of drainage.   Electronically Signed: D. Franky Rakers, PA-C 04/13/2024, 4:55 PM   I spent a total of 15 Minutes at the the patient's bedside AND on the patient's hospital floor or unit, greater than 50% of which was counseling/coordinating care for left abdominal fluid collection drains    Patient ID: Angelica [name], female   DOB: [birth year], 83 y.o.   MRN: [id number]

## 2024-04-13 NOTE — Assessment & Plan Note (Signed)
 At baseline Continue namenda  Delirium precautions

## 2024-04-13 NOTE — Assessment & Plan Note (Signed)
 Diagnosed in 05/2020 S/p concurrent chemoradiation with weekly carboplatin  for AUC of 2 and paclitaxel  45 mg/M2. S/P 5 X -2022 S/p Consolidation treatment with immunotherapy with Imfinzi  1500 mg IV q 4 wk x 3 cycles - discontinued 2/2 intolerance/sepsis Currently on observation Pleural fluid cytology was negative from 11/12

## 2024-04-13 NOTE — Progress Notes (Signed)
 PT Cancellation Note  Patient Details Name: Angelica [name] MRN: [id number] DOB: 06/30/1940   Cancelled Treatment:     Pt was given IV Morphine  earlier due to pain.  Currently resting comfortably.  Pt has been evaluated with rec for SNF.  Rehab Team to continue to follow and attempt to see as schedule permits.   Katheryn Leap  PTA Acute  Rehabilitation Services Office M-F          [phone]

## 2024-04-13 NOTE — Plan of Care (Signed)
  Problem: Clinical Measurements: Goal: Ability to maintain clinical measurements within normal limits will improve Outcome: Progressing   Problem: Nutrition: Goal: Adequate nutrition will be maintained Outcome: Progressing   Problem: Elimination: Goal: Will not experience complications related to bowel motility Outcome: Progressing Goal: Will not experience complications related to urinary retention Outcome: Progressing   Problem: Pain Managment: Goal: General experience of comfort will improve and/or be controlled Outcome: Progressing

## 2024-04-13 NOTE — Assessment & Plan Note (Signed)
 A1c 5.9 Will cover with sensitive-scale SSI

## 2024-04-14 ENCOUNTER — Inpatient Hospital Stay (HOSPITAL_COMMUNITY)

## 2024-04-14 DIAGNOSIS — J189 Pneumonia, unspecified organism: Secondary | ICD-10-CM | POA: Diagnosis not present

## 2024-04-14 DIAGNOSIS — J918 Pleural effusion in other conditions classified elsewhere: Secondary | ICD-10-CM | POA: Diagnosis not present

## 2024-04-14 DIAGNOSIS — R609 Edema, unspecified: Secondary | ICD-10-CM

## 2024-04-14 LAB — BASIC METABOLIC PANEL WITH GFR
Anion gap: 6 (ref 5–15)
BUN: 20 mg/dL (ref 8–23)
CO2: 27 mmol/L (ref 22–32)
Calcium: 8.1 mg/dL — ABNORMAL LOW (ref 8.9–10.3)
Chloride: 103 mmol/L (ref 98–111)
Creatinine, Ser: 0.52 mg/dL (ref 0.44–1.00)
GFR, Estimated: >60 mL/min (ref >60)
Glucose, Bld: 133 mg/dL — ABNORMAL HIGH (ref 70–99)
Potassium: 4.2 mmol/L (ref 3.5–5.1)
Sodium: 136 mmol/L (ref 135–145)

## 2024-04-14 LAB — GLUCOSE, CAPILLARY
Glucose-Capillary: 113 mg/dL — ABNORMAL HIGH (ref 70–99)
Glucose-Capillary: 134 mg/dL — ABNORMAL HIGH (ref 70–99)
Glucose-Capillary: 155 mg/dL — ABNORMAL HIGH (ref 70–99)
Glucose-Capillary: 138 mg/dL — ABNORMAL HIGH (ref 70–99)
Glucose-Capillary: 143 mg/dL — ABNORMAL HIGH (ref 70–99)

## 2024-04-14 LAB — MAGNESIUM: Magnesium: 1.8 mg/dL (ref 1.7–2.4)

## 2024-04-14 LAB — PHOSPHORUS: Phosphorus: 2.5 mg/dL (ref 2.5–4.6)

## 2024-04-14 MED ORDER — TRAVASOL 10 % IV SOLN
INTRAVENOUS | Status: AC
  Filled 2024-04-14: qty 826.8

## 2024-04-14 MED ORDER — OXYCODONE HCL 5 MG PO TABS
5.0000 mg | ORAL_TABLET | ORAL | Status: DC | PRN
Start: 2024-04-14 — End: 2024-04-18
  Administered 2024-04-14 – 2024-04-18 (×17): 5 mg via ORAL
  Filled 2024-04-14 (×19): qty 1

## 2024-04-14 NOTE — Assessment & Plan Note (Signed)
 A1c 5.9 Will cover with sensitive-scale SSI

## 2024-04-14 NOTE — Assessment & Plan Note (Signed)
 Continue high intensity statin

## 2024-04-14 NOTE — Assessment & Plan Note (Signed)
 Normal TSH, mildly elevated free T4 Continue synthroid  50mcg daily

## 2024-04-14 NOTE — Assessment & Plan Note (Signed)
 K+ 2.4 this AM Repleted Recheck in AM

## 2024-04-14 NOTE — Assessment & Plan Note (Signed)
 No signs of exacerbation  Continue bronchodilators

## 2024-04-14 NOTE — Assessment & Plan Note (Signed)
Continue protonix daily. 

## 2024-04-14 NOTE — Assessment & Plan Note (Signed)
 Flat and mildly elevated troponin 27>28 No chest pain or palpitations, no changes on EKG  Related to demand ischemia in the setting of sepsis

## 2024-04-14 NOTE — Progress Notes (Signed)
 Occupational Therapy Treatment Patient Details Name: Angelica [name] MRN: [id number] DOB: 17-Sep-1940 Today's Date: 04/14/2024   History of present illness Angelica [name] is a 83 yr old female presented to the ED with complaints of weakness; admitted 11/9 with large loculated left pleural effusion. 03/31/2024 diagnosed with SBO, no s/p exploratory laparotomy on 11/14, placed NGT 04/01/24 s/p pigtail chest tube placed. PMH: GERD, HTN, HLD, IDA, asthma, stage 3b adenocarcinoma of the lung, hypothyroidism, CVA, Alzheimer's, SBO due to adhesions in 2016 and 2021   OT comments  The pt was seen for functional strengthening and progression of ADL participation. She required increased assistance for supine to sit. Once seated EOB, she presented with fair sitting balance while performing face washing and teeth brushing. She subsequently required mod assist x2 to stand using a RW, then to take lateral steps towards the head of the bed. She was assisted back to bed with her RUE elevated to assist with edema management. Continue OT plan of care to facilitate improved strengthening and progressive ADL performance. Patient will benefit from continued inpatient follow up therapy, <3 hours/day.      If plan is discharge home, recommend the following:  A lot of help with walking and/or transfers;A lot of help with bathing/dressing/bathroom;Assistance with cooking/housework;Direct supervision/assist for medications management;Assist for transportation;Direct supervision/assist for financial management;Help with stairs or ramp for entrance   Equipment Recommendations  None recommended by OT    Recommendations for Other Services      Precautions / Restrictions Precautions Precautions: Fall Precaution/Restrictions Comments: midline incision Restrictions Weight Bearing Restrictions Per Provider Order: No       Mobility Bed Mobility Overal bed mobility: Needs Assistance Bed Mobility: Sidelying to Sit,  Supine to Sit     Supine to sit: Max assist, +2 for physical assistance Sit to supine: Max assist   General bed mobility comments: Pt was instructed on implementing the log roll technique to perform supine to sit. She required assist for trunk and BLE, as well as cues for general sequencing/transfer technique    Transfers Overall transfer level: Needs assistance Equipment used: Rolling walker (2 wheels) Transfers: Sit to/from Stand Sit to Stand: Mod assist, +2 physical assistance           General transfer comment: Once in standing, she was instructed on lateral stepping along the EOB, for which she required cues to correct forward flexed posture, as well as for walker advancement     Balance     Sitting balance-Leahy Scale: Fair       Standing balance-Leahy Scale: Poor Standing balance comment: Mod assist with RW           ADL either performed or assessed with clinical judgement   ADL Overall ADL's : Needs assistance/impaired Eating/Feeding: Set up;Bed level Eating/Feeding Details (indicate cue type and reason): She drank water  from a cup in bed. Grooming: Set up;Supervision/safety;Sitting;Wash/dry face;Oral care Grooming Details (indicate cue type and reason): The patient required SBA to perform face washing and teeth brushing seated EOB. She intermittently propped her upper body onto the bedside table placed in front of her, due to increased abdominal pain.             Lower Body Dressing: Total assistance;Bed level Lower Body Dressing Details (indicate cue type and reason): assist needed to donn socks in bed                      Cognition Arousal: Alert Behavior During Therapy: [facility]  for tasks assessed/performed Cognition: History of cognitive impairments         Attention impairment (select first level of impairment): Divided attention Executive functioning impairment (select all impairments): Problem solving          Following commands:  Impaired Following commands impaired: Follows one step commands with increased time      Cueing   Cueing Techniques: Verbal cues, Gestural cues             Pertinent Vitals/ Pain       Pain Assessment Pain Assessment: Faces Pain Score: 7  Pain Location: abdomen Pain Descriptors / Indicators: Grimacing, Moaning, Guarding Pain Intervention(s): Limited activity within patient's tolerance, Monitored during session, Repositioned, RN gave pain meds during session   Frequency  Min 2X/week        Progress Toward Goals  OT Goals(current goals can now be found in the care plan section)  Progress towards OT goals: Progressing toward goals  Acute Rehab OT Goals OT Goal Formulation: With patient Time For Goal Achievement: 04/17/24 Potential to Achieve Goals: Good  Plan         AM-PAC OT 6 Clicks Daily Activity     Outcome Measure   Help from another person eating meals?: A Little Help from another person taking care of personal grooming?: A Little Help from another person toileting, which includes using toliet, bedpan, or urinal?: A Lot Help from another person bathing (including washing, rinsing, drying)?: A Lot Help from another person to put on and taking off regular upper body clothing?: A Lot Help from another person to put on and taking off regular lower body clothing?: Total 6 Click Score: 13    End of Session Equipment Utilized During Treatment: Gait belt;Rolling walker (2 wheels)  OT Visit Diagnosis: Pain;Muscle weakness (generalized) (M62.81);Other abnormalities of gait and mobility (R26.89);Unsteadiness on feet (R26.81) Pain - part of body:  (abdomen)   Activity Tolerance Patient limited by pain   Patient Left in bed;with call bell/phone within reach;with bed alarm set   Nurse Communication Mobility status        Time: 1020-1046 OT Time Calculation (min): 26 min  Charges: OT General Charges $OT Visit: 1 Visit OT Treatments $Self Care/Home  Management : 8-22 mins $Therapeutic Activity: 8-22 mins     [name], OTR/L 04/14/2024, 12:30 PM

## 2024-04-14 NOTE — Assessment & Plan Note (Signed)
 Replete K+, Mag++, Phos as needed

## 2024-04-14 NOTE — Assessment & Plan Note (Signed)
 Allergy to ASA On plavix  and high intensity statin PTA Per neuro note October 30 2023- She had an MRI scan of the brain done on 07/18/2021 which I personally reviewed and shows only mild changes of small vessel disease and generalized atrophy. I do not see a definite right cerebellar stroke but the radiologist has called 1 which apparently was not seen on previous MRI from 06/14/2020. Patient has since been started on Plavix  by Dr. Norleen   Neuro Dr Rosemarie did not feel it was CVA Plavix  is currently on hold

## 2024-04-14 NOTE — Assessment & Plan Note (Signed)
 POA Improved, passed voiding trial Had recurrence and foley was replaced on 11/24 Will start tamsulosin 

## 2024-04-14 NOTE — Assessment & Plan Note (Addendum)
 Wound 04/12/24 2315 Pressure Injury Sacrum Medial Stage 2 -  Partial thickness loss of dermis presenting as a shallow open injury with a red, pink wound bed without slough. (Active)  Not present on admission

## 2024-04-14 NOTE — Assessment & Plan Note (Signed)
 Diagnosed in 05/2020 S/p concurrent chemoradiation with weekly carboplatin  for AUC of 2 and paclitaxel  45 mg/M2. S/P 5 X -2022 S/p Consolidation treatment with immunotherapy with Imfinzi  1500 mg IV q 4 wk x 3 cycles - discontinued 2/2 intolerance/sepsis Currently on observation Pleural fluid cytology was negative from 11/12

## 2024-04-14 NOTE — Progress Notes (Signed)
 IVT to bedside for AM line care and dressing change on patient's PICC line. Upon assessment, pt's right arm noted to be significantly more edematous compared to the left.  [place] to RN and MD r/t to findings. Edema noted to be non-pitting, pulses present.  Dressing changed without incident.

## 2024-04-14 NOTE — Progress Notes (Addendum)
 PHARMACY - TOTAL PARENTERAL NUTRITION CONSULT NOTE   Indication: Prolonged ileus  Patient Measurements: Height: 5' 5 (165.1 cm) Weight: 62.7 kg (138 lb 3.7 oz) IBW/kg (Calculated) : 57 TPN AdjBW (KG): 59.6 Body mass index is 23 kg/m.  Assessment: Presented with abdominal pain and weakness - 11/14: Ex lap with LOA, SBR and repair - Prolonged post-op ileus with additional post-op complications including IP abscesses.  Glucose / Insulin : No h/o DM. A1c 5.9% -BG goal <180. BG range: 113-144 -SSI given: 8 units  Electrolytes: -Na now WNL on max concentration in TPN -All other electrolytes WNL, including K 4.2, Mg 1.8, Phos 2.5, CorrCa (9.5) -Goal electrolytes with ileus: K >/= 4, Mg >/=2  Renal: Scr <1, BUN WNL and stable  Hepatic: LFTs, Alk Phos, T.bili all WNL. Albumin  low - Triglycerides 47  Intake / Output; MIVF:  -mIVF: None -po: (FLD) 20 ml noted. One Mallie Pinion charted -UOP: 1350 mL,  -Drains: 55ml - LBM: 11/13 vs 11/22 discrepancy in documentation  GI Imaging: 11/11 CT: SBO 11/13: AbdXR: SBO 11/14: AbdXR: SBO 11/21: CT: resolution of the previously noted small bowel obstruction. Loculated, rim enhancing intraperitoneal fluid collection adjacent to the anastomosis concerning for a postoperative abscess. Second similar loculated, rim enhancing peritoneal fluid collection in the LLQ  GI Surgeries / Procedures:  -11/14: Ex lap with LOA, SBR and repair -11/21: IR placement of drains for abdominal abscesses  Central access: Double lumen PICC placed 11/18 TPN start date:  04/07/24  Nutritional Goals: Goal TPN rate is 65 mL/hr (provides 83 g of protein and 1446 kcals per day)  RD Assessment: Estimated Needs Total Energy Estimated Needs: 1450-1600 kcals Total Protein Estimated Needs: 70-85 grams Total Fluid Estimated Needs: >/= 1.5L  Current Nutrition:  TPN (lipids held 11/18-11/24, started 11/24 PM)  - 11/23: FLD - 11/24: Calorie count: Patient will meet  greater than or equal to 90% of their needs.  - Add [employer] 1.4 PO BID, each supplement provides 455 kcal and 20 grams protein.   Lipid recommendations in TPN: - Peanut allergy: Use SMOFlipid with potential cross reactivity risk with soybean - Fish Allergy: (non-anaphylactic): Use Intralipid  - Shellfish Allergy (non-anaphylactic): If fish allergy confirmed or the patient avoids all seafood, Use Intralipid   - For the short-term (< 7-10 days), benefits/risks can be discussed with patient or intravenous fat emulsions can be held (11/18-11/24) --patient did receive Intralipid  in January 2022.  Plan:   At 18:00: Increase TPN to goal 65 mL/hr  11/24 PM Add Intralipid  (received in 2022) Will provide 82g protein and 1597 kcal Electrolytes in TPN: No changes Na 150 mEq/L, K 50 mEq/L, Ca 5 mEq/L, Mg 10 mEq/L, and Phos 15 mmol/L.  Cl:Ac 1:2 Add standard MVI and trace elements to TPN Continue moderate SSI q4h and adjust as needed  Monitor TPN labs on Mon/Thurs and PRN (next Thurs) F/u Calorie count  Casimir Camelia Ours, PharmD, BCPS Clinical Pharmacist 04/14/2024 7:17 AM

## 2024-04-14 NOTE — Assessment & Plan Note (Signed)
 RN noted periodic short episodes of bradycardia with sleep No intervention would be appropriate Will continue to monitor on telemetry

## 2024-04-14 NOTE — Progress Notes (Signed)
 Progress Note   Patient: Angelica [name] FMW:[id number] DOB: [birth year] DOA: 03/29/2024     16 DOS: the patient was seen and examined on 04/14/2024   Brief hospital course: 83yo with h/o GERD, HTN, HLD, IDA, asthma, stage 3b adenocarcinoma of the lung, hypothyroidism, CVA, Alzheimer's dementia, and hx of SBO due to adhesions in 2016 and 2021 who presented on 11/9 with weakness and breast swelling. She was noted to have leukocytosis with lactate of 2.1.  CXR with near-complete opacification of the left lung, differential includes large malignant pleural effusion, central hilar mass with obstruction, lobar atelectasis, and/or pneumonia.  CT A/P with large highly located left pleural effusion with compressive atelectasis throughout the left lung.  She was started on antibiotics, pulmonary consulted.  11/11 also found to have SBO, NG tube placed, surgery consulted.  Underwent lysis of adhesions with small bowel resection on 11/14.  On TPN.  Started calorie count but she is eating so little that this was moot.    Assessment & Plan Pleural effusion associated with pulmonary infection vs. recurrent malignancy and sepsis Presenting with weakness, cough  PCCM was consulting, now signed off Blood culture negative On room air Continue broad-spectrum antibiotics w/ vancomycin  and cefepime   Chest tube placed 11/12, removed 11/15 Fluid studies suggest possible infectious etiology due to elevated WBC count, elevated LDH, elevated protein fluid but culture negative Marked leukocytosis 11/20; see below re: abdomen but CT chest relatively unremarkable SBO (small bowel obstruction) (HCC) H/o prior SBO x 2 Patient complained of stomach upset and vomited 11/11; x-ray concerning for SBO  Surgery consulted NG tube inserted Repeat CT abdomen 11/11 small bowel obstruction with proximal transition point just beyond the ligament of Treitz Underwent ex lap with lysis of adhesion and small bowel resection on  11/14 Started on TPN Marked leukocytosis 11/20 with reported significant abdominal pain CT showed interval partial SBO with resolution of previously noted SBO with oral contrast into the distal transverse colon Also showed loculated intraperitoneal abscess in LLQ 1.8 x 3.8 x 10.5 cm IR placed 2 CT-guided perc drains on 11/21; cultures NTD Advanced to CLD on 11/22, full liquids on 11/23 Calorie count ongoing Weakness Likely secondary to above Fall precautions PT/OT recommending STR Starting calorie count Will need to be able to maintain nutritional status without TPN in order to transition to STR Adenocarcinoma of left lung, stage 3 (HCC) Diagnosed in 05/2020 S/p concurrent chemoradiation with weekly carboplatin  for AUC of 2 and paclitaxel  45 mg/M2. S/P 5 X -2022 S/p Consolidation treatment with immunotherapy with Imfinzi  1500 mg IV q 4 wk x 3 cycles - discontinued 2/2 intolerance/sepsis Currently on observation Pleural fluid cytology was negative from 11/12 HTN (hypertension) Continue amlodipine   PRN metoprolol Elevated troponin Flat and mildly elevated troponin 27>28 No chest pain or palpitations, no changes on EKG  Related to demand ischemia in the setting of sepsis HLD (hyperlipidemia) Continue high intensity statin  History of stroke Allergy to ASA On plavix  and high intensity statin PTA Per neuro note October 30 2023- She had an MRI scan of the brain done on 07/18/2021 which I personally reviewed and shows only mild changes of small vessel disease and generalized atrophy. I do not see a definite right cerebellar stroke but the radiologist has called 1 which apparently was not seen on previous MRI from 06/14/2020. Patient has since been started on Plavix  by Dr. Norleen   Neuro Dr Rosemarie did not feel it was CVA Plavix  is currently on hold Hypothyroidism Normal  TSH, mildly elevated free T4 Continue synthroid  50mcg daily  Asthma, severe persistent, well-controlled (HCC) No signs of  exacerbation  Continue bronchodilators Alzheimer's disease (HCC) At baseline Continue namenda  Delirium precautions  GERD (gastroesophageal reflux disease) Continue protonix  daily  Malnutrition of moderate degree Nutrition Problem: Moderate Malnutrition Etiology: chronic illness Signs/Symptoms: mild fat depletion, moderate muscle depletion Interventions: Refer to RD note for recommendations, TPN Hypokalemia K+ 2.4 this AM Repleted Recheck in AM Prediabetes A1c 5.9 Will cover with sensitive-scale SSI Sinus bradycardia RN noted periodic short episodes of bradycardia with sleep No intervention would be appropriate Will continue to monitor on telemetry Electrolyte abnormality Replete K+, Mag++, Phos as needed Pressure injury of sacral region, stage 2 (HCC) Wound 04/12/24 2315 Pressure Injury Sacrum Medial Stage 2 -  Partial thickness loss of dermis presenting as a shallow open injury with a red, pink wound bed without slough. (Active)  Not present on admission Acute urinary retention POA Improved, passed voiding trial Had recurrence and foley was replaced on 11/24 Will start tamsulosin       Consultants: Pulmonology Surgery IR Palliative PT OT Dietician [facility] team   Procedures: Chest tube insertion 11/12 Ex lap, lysis of adhesions, small bowel resection 11/14 CT-guided perc drain placement 11/21   Antibiotics: Cefepime  11/9- Metronidazole  11/14- Vancomycin  11/9-14  30 Day Unplanned Readmission Risk Score    Flowsheet Row ED to Hosp-Admission (Current) from 03/29/2024 in [facility]  30 Day Unplanned Readmission Risk Score (%) 31.41 Filed at 04/14/2024 0400    This score is the patient's risk of an unplanned readmission within 30 days of being discharged (0 -100%). The score is based on dignosis, age, lab data, medications, orders, and past utilization.   Low:  0-14.9   Medium: 15-21.9   High: 22-29.9   Extreme: 30 and above            Subjective: Moaning today, appears uncomfortable.  Attempted to reposition, offer pain meds.  She recently received morphine .   Objective: Vitals:   04/14/24 0412 04/14/24 1344  BP: (!) 115/55 116/62  Pulse: 81 89  Resp: 18 20  Temp: 98.1 F (36.7 C) 98.1 F (36.7 C)  SpO2: 99% 99%    Intake/Output Summary (Last 24 hours) at 04/14/2024 1619 Last data filed at 04/14/2024 1257 Gross per 24 hour  Intake 1558.73 ml  Output 540 ml  Net 1018.73 ml   Filed Weights   04/03/24 1327 04/07/24 0400 04/09/24 0524  Weight: 59.6 kg 58.8 kg 62.7 kg    Exam:  General:  Appears calm but uncomfortable, moaning; frail, chronically ill Eyes:  normal lids, iris ENT:  grossly normal hearing, lips & tongue, mmm Cardiovascular:  RRR. No LE edema.  Respiratory:   CTA bilaterally with no wheezes/rales/rhonchi.  Normal respiratory effort. Abdomen: midline bandage C/D/I; L-sided drains in place Skin:  no rash or induration seen on limited exam Musculoskeletal:  generalized weakness Psychiatric: blunted mood and affect, speech sparse Neurologic:  CN 2-12 grossly intact  Data Reviewed: I have reviewed the patient's lab results since admission.  Pertinent labs for today include:   Glucose 133, 113, 134     Family Communication: Granddaughter was present  Mobility: PT/OT Consulted and are recommending - Skilled Nursing-Short Term Rehab (<3 Hours/Day)04/09/2024 1453    Code Status: Full Code    Disposition: Status is: Inpatient Remains inpatient appropriate because: ongoing management     Time spent: 50 minutes  Unresulted Labs (From admission, onward)     Start  Ordered   04/13/24 0500  Triglycerides  (TPN Lab Panel)  Every Monday (0500),   R     Question:  Specimen collection method  Answer:  Lab=Lab collect   04/07/24 1027   04/09/24 0500  Comprehensive metabolic panel  (TPN Lab Panel)  Every Mon,Thu (0500),   R     Question:  Specimen collection method  Answer:   Lab=Lab collect   04/07/24 1027   04/09/24 0500  Magnesium   (TPN Lab Panel)  Every Mon,Thu (0500),   R     Question:  Specimen collection method  Answer:  Lab=Lab collect   04/07/24 1027   04/09/24 0500  Phosphorus  (TPN Lab Panel)  Every Mon,Thu (0500),   R     Question:  Specimen collection method  Answer:  Lab=Lab collect   04/07/24 1027             Author: Delon Herald, MD 04/14/2024 4:19 PM  For on call review [email or web address].

## 2024-04-14 NOTE — TOC Progression Note (Signed)
 Transition of Care [facility]) - Progression Note    Patient Details  Name: SAROYA RICCOBONO MRN: [id number] Date of Birth: 06-11-1940  Transition of Care [facility]) CM/SW Contact  Alfonse JONELLE Rex, RN Phone Number: 04/14/2024, 1:34 PM  Clinical Narrative:   Call to patient's daughter, Larry, to review SNF bed offers ( [facility], [place], [address], [place], [place]), Taron accepted bed offer at [facility], [place], admit coord w/[place] notified. Will initiate insurance auth when closer to discharge.      Expected Discharge Plan: Skilled Nursing Facility Barriers to Discharge: Continued Medical Work up               Expected Discharge Plan and Services In-house Referral: Clinical Social Work   Post Acute Care Choice: Home Health Living arrangements for the past 2 months: Single Family Home                 DME Arranged: N/A DME Agency: NA                   Social Drivers of Health (SDOH) Interventions SDOH Screenings   Food Insecurity: No Food Insecurity (03/29/2024)  Housing: Low Risk  (03/29/2024)  Transportation Needs: No Transportation Needs (03/29/2024)  Utilities: Not At Risk (03/29/2024)  Alcohol Screen: Low Risk  (08/20/2023)  Depression (PHQ2-9): Low Risk  (12/17/2023)  Financial Resource Strain: Low Risk  (12/15/2023)  Physical Activity: Inactive (12/15/2023)  Social Connections: Moderately Integrated (12/15/2023)  Stress: No Stress Concern Present (12/15/2023)  Tobacco Use: Medium Risk (04/03/2024)  Health Literacy: Adequate Health Literacy (08/20/2023)    Readmission Risk Interventions    10/08/2022    2:08 PM  Readmission Risk Prevention Plan  Transportation Screening Complete  PCP or Specialist Appt within 5-7 Days Complete  Home Care Screening Complete  Medication Review (RN CM) Complete

## 2024-04-14 NOTE — Assessment & Plan Note (Signed)
 Presenting with weakness, cough  PCCM was consulting, now signed off Blood culture negative On room air Continue broad-spectrum antibiotics w/ vancomycin  and cefepime   Chest tube placed 11/12, removed 11/15 Fluid studies suggest possible infectious etiology due to elevated WBC count, elevated LDH, elevated protein fluid but culture negative Marked leukocytosis 11/20; see below re: abdomen but CT chest relatively unremarkable

## 2024-04-14 NOTE — Assessment & Plan Note (Signed)
 Likely secondary to above Fall precautions PT/OT recommending STR Starting calorie count Will need to be able to maintain nutritional status without TPN in order to transition to STR

## 2024-04-14 NOTE — Assessment & Plan Note (Signed)
 At baseline Continue namenda  Delirium precautions

## 2024-04-14 NOTE — Progress Notes (Signed)
 Nutrition Follow-up  DOCUMENTATION CODES:   Non-severe (moderate) malnutrition in context of chronic illness  INTERVENTION:  - Continue TPN to meet nutritional needs.             - TPN management per pharmacy.   - Discontinue calorie count until patient able to eat more substantial foods/amounts.   - Full Liquid diet. [employer] 1.4 PO BID, each supplement provides 455 kcal and 20 grams protein  - Monitor bowel movements.   - Monitor weight trends.  NUTRITION DIAGNOSIS:   Moderate Malnutrition related to chronic illness as evidenced by mild fat depletion, moderate muscle depletion. *ongoing  GOAL:   Patient will meet greater than or equal to 90% of their needs *met with TPN  MONITOR:   Diet advancement, Labs, Weight trends  REASON FOR ASSESSMENT:   Other (Comment) (New TPN)    ASSESSMENT:   83 y.o. 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 Admitted for PE and SBO.  11/9 Admit 11/11 NGT placed 11/18 TPN initiated 11/21 NGT removed 11/23 Calorie count initiated  11/24 Intralipids added to TPN, TPN advanced to goal and meeting 100% of needs  Calorie count complete, see separate note for results.  Patient only had 32 kcals and 0g of protein at meals yesterday. Accepting Gift Pinion but no documentation of intake and suspect patient taking in very little.   Presented to bedside. Patient again moaning in pain and holding her abdomen. Reports being in a lot of pain this morning. Only taking sips of liquids.  Discussed that TPN now at goal and meeting 100% of needs.   Suspect intake remains very poor secondary to pain and discomfort due to patient not having had a bowel movement since admission. Dicussed results with Hospitalist and GI.  Suspect above issues is significantly impacting oral intake. Patient is eating very limited items (broth, soup, juice, diet soda). Will plan to discontinue calorie count until patient having bowel movements and able to take in more substantial foods and amounts.      Admit weight: 131# Current weight: 138# I&O's: +6.9L since 11/11  Medications reviewed and include: Miralax  daily, Protonix    Labs reviewed:  - Triglycerides 47 (as of 11/24)  Diet Order:   Diet Order             Diet full liquid Fluid consistency: Thin  Diet effective now                   EDUCATION NEEDS:  Education needs have been addressed  Skin:  Skin Assessment: Skin Integrity Issues: Skin Integrity Issues:: Stage II Stage II: Sacrum Incisions: Surgical on abdomen  Last BM:  PTA  Height:  Ht Readings from Last 1 Encounters:  04/03/24 5' 5 (1.651 m)   Weight:  Wt Readings from Last 1 Encounters:  04/09/24 62.7 kg   Ideal Body Weight:  56.82 kg  BMI:  Body mass index is 23 kg/m.  Estimated Nutritional Needs:  Kcal:  1500-1700 kcals Protein:  80-95 Fluid:  >/= 1.5L    Trude Ned RD, LDN Contact via Secure Chat.

## 2024-04-14 NOTE — Assessment & Plan Note (Addendum)
 Nutrition Problem: Moderate Malnutrition Etiology: chronic illness Signs/Symptoms: mild fat depletion, moderate muscle depletion Interventions: Refer to RD note for recommendations, TPN

## 2024-04-14 NOTE — Progress Notes (Signed)
 Progress Note  11 Days Post-Op  Subjective: Granddaughter present during encounter.  Patient reports that pain is manageable. Tolerating FLD. Denies nausea and vomiting. No BM recorded or definitive flatulence.   ROS  All negative with the exception of above.  Objective: Vital signs in last 24 hours: Temp:  [98.1 F (36.7 C)-98.7 F (37.1 C)] 98.1 F (36.7 C) (11/25 0412) Pulse Rate:  [77-91] 81 (11/25 0412) Resp:  [16-20] 18 (11/25 0412) BP: (115-117)/(55-75) 115/55 (11/25 0412) SpO2:  [95 %-99 %] 99 % (11/25 0412) FiO2 (%):  [21 %] 21 % (11/24 2153) Last BM Date :  (PTA)  Intake/Output from previous day: 11/24 0701 - 11/25 0700 In: 1920.4 [P.O.:20; I.V.:1470.4; IV Piggyback:400] Out: 1405 [Urine:1350; Drains:55] Intake/Output this shift: No intake/output data recorded.  PE: General: Female who is laying in bed in NAD. HEENT: Head is normocephalic, atraumatic.  Heart: HR normal during encounter.   Lungs: Respiratory effort nonlabored. Abd: Soft with some distention. NT to palpation. Abdominal incision C/D/I. No rebound tenderness or guarding. 2 drains noted of left abdomen. More superior drain is noted to have <25 mL of SS fluid. More inferior drain is noted to have <25 mL of clear/yellowish fluid present.  MS: Able to move all extremities.  Skin: Warm and dry.    Lab Results:  Recent Labs    04/13/24 0245  WBC 17.4*  HGB 8.3*  HCT 25.2*  PLT 232   BMET Recent Labs    04/13/24 0245 04/14/24 0401  NA 136 136  K 3.9 4.2  CL 103 103  CO2 26 27  GLUCOSE 137* 133*  BUN 17 20  CREATININE 0.55 0.52  CALCIUM  8.2* 8.1*   PT/INR No results for input(s): LABPROT, INR in the last 72 hours. CMP     Component Value Date/Time   NA 136 04/14/2024 0401   K 4.2 04/14/2024 0401   CL 103 04/14/2024 0401   CO2 27 04/14/2024 0401   GLUCOSE 133 (H) 04/14/2024 0401   BUN 20 04/14/2024 0401   CREATININE 0.52 04/14/2024 0401   CREATININE 0.96 12/27/2023 0757    CREATININE 0.86 11/02/2022 1604   CALCIUM  8.1 (L) 04/14/2024 0401   PROT 5.7 (L) 04/13/2024 0245   PROT 7.7 06/07/2017 0940   ALBUMIN  2.3 (L) 04/13/2024 0245   AST 15 04/13/2024 0245   AST 24 12/27/2023 0757   ALT 6 04/13/2024 0245   ALT 15 12/27/2023 0757   ALKPHOS 51 04/13/2024 0245   BILITOT 0.3 04/13/2024 0245   BILITOT 0.7 12/27/2023 0757   GFRNONAA >60 04/14/2024 0401   GFRNONAA 58 (L) 12/27/2023 0757   GFRAA >60 08/15/2017 0012   Lipase     Component Value Date/Time   LIPASE 30 10/09/2023 1042       Studies/Results: No results found.  Anti-infectives: Anti-infectives (From admission, onward)    Start     Dose/Rate Route Frequency Ordered Stop   04/06/24 1300  metroNIDAZOLE  (FLAGYL ) IVPB 500 mg        500 mg 100 mL/hr over 60 Minutes Intravenous Every 12 hours 04/06/24 1217     04/03/24 1330  metroNIDAZOLE  (FLAGYL ) IVPB 500 mg        500 mg 100 mL/hr over 60 Minutes Intravenous On call to O.R. 04/03/24 1242 04/03/24 1507   03/30/24 2200  vancomycin  (VANCOREADY) IVPB 750 mg/150 mL  Status:  Discontinued        750 mg 150 mL/hr over 60 Minutes Intravenous Every 24 hours  03/29/24 1620 04/03/24 1310   03/30/24 0400  ceFEPIme  (MAXIPIME ) 2 g in sodium chloride  0.9 % 100 mL IVPB        2 g 200 mL/hr over 30 Minutes Intravenous Every 12 hours 03/29/24 1620     03/29/24 1530  vancomycin  (VANCOCIN ) IVPB 1000 mg/200 mL premix        1,000 mg 200 mL/hr over 60 Minutes Intravenous  Once 03/29/24 1517 03/30/24 0228   03/29/24 1530  ceFEPIme  (MAXIPIME ) 2 g in sodium chloride  0.9 % 100 mL IVPB        2 g 200 mL/hr over 30 Minutes Intravenous  Once 03/29/24 1518 03/29/24 1724        Assessment/Plan POD 11, s/p ex lap with SBR, Dr. Teresa 11/14 -CT from 11/21 showed fluid collections of abdomen.  -Vitals stable. -WBC from 11/24: 17.4 from 34.8. -HGB from 11/24: 8.3 today from 9.8 from 11.3. Continue to monitor. -Output of Lhip JP drain was 30 mL SS and LLQ drain  noted to be 25 mL yellow/clear. Cultures still pending. IR recommended monitoring output of drains and if remain low over next 24-48 hours consider f/u CT to assess adequacy of drainage.  -Tolerating FLD. Ongoing calorie count.  -Continue PICC and TNA at this time.  -Mirilax. -Mobilize as able -Will continue to follow   FEN - FLD/TNA/IVFs per primary VTE - Lovenox  ID - Cefipime, flagyl , monitor WBC and HGB   HTN HLD h/o CVA Alzheimer's disease  Stage III adenocarcinoma of lung GERD Hypothyroidism PCM    LOS: 16 days   I reviewed hospitalist notes, specialist notes, nursing notes, last 24 h vitals and pain scores, last 48 h intake and output, last 24 h labs and trends, and last 24 h imaging results.   Marjorie Carlyon Favre, [facility] Surgery 04/14/2024, 10:45 AM Please see Amion for pager number during day hours 7:00am-4:30pm

## 2024-04-14 NOTE — Assessment & Plan Note (Signed)
 Continue amlodipine   PRN metoprolol

## 2024-04-14 NOTE — Progress Notes (Addendum)
 Calorie Count Note  48 hour calorie count ordered.  Diet: Full Liquid Supplements: [employer] 1.4 BID  Day 1 (11/23): Breakfast: 100% per patient - grits, orange sherbet, orange juice = 290 kcals, 2g protein Lunch: 0% Dinner: 100% - chicken broth, ginger ale, cranberry juice = 105 kcal. 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    Goal: Patient will meet greater than or equal to 90% of their needs   Subjective: Discussed results with Hospitalist and GI. Given poor results and ongoing issues with abdominal pain and no BM since admission 16 days ago that are likely affecting oral intake, plan to discontinue calorie count. Can restart calorie count once patient had a bowel movement and able to take in more substantial foods.  Intervention:  - Full Liquid diet. [place] [employer] 1.4 PO BID, each supplement provides 455 kcal and 20 grams protein.   - Continue TPN to meet nutritional needs.             - TPN management per pharmacy.   Full note follow.  Trude Ned RD, LDN Contact via [employer].

## 2024-04-14 NOTE — Assessment & Plan Note (Signed)
 H/o prior SBO x 2 Patient complained of stomach upset and vomited 11/11; x-ray concerning for SBO  Surgery consulted NG tube inserted Repeat CT abdomen 11/11 small bowel obstruction with proximal transition point just beyond the ligament of Treitz Underwent ex lap with lysis of adhesion and small bowel resection on 11/14 Started on TPN Marked leukocytosis 11/20 with reported significant abdominal pain CT showed interval partial SBO with resolution of previously noted SBO with oral contrast into the distal transverse colon Also showed loculated intraperitoneal abscess in LLQ 1.8 x 3.8 x 10.5 cm IR placed 2 CT-guided perc drains on 11/21; cultures NTD Advanced to CLD on 11/22, full liquids on 11/23 Calorie count ongoing

## 2024-04-15 ENCOUNTER — Inpatient Hospital Stay (HOSPITAL_COMMUNITY)

## 2024-04-15 ENCOUNTER — Encounter (HOSPITAL_COMMUNITY): Payer: Self-pay | Admitting: Family Medicine

## 2024-04-15 DIAGNOSIS — J189 Pneumonia, unspecified organism: Secondary | ICD-10-CM | POA: Diagnosis not present

## 2024-04-15 DIAGNOSIS — J918 Pleural effusion in other conditions classified elsewhere: Secondary | ICD-10-CM | POA: Diagnosis not present

## 2024-04-15 LAB — CBC WITH DIFFERENTIAL/PLATELET
Abs Immature Granulocytes: 0.33 K/uL — ABNORMAL HIGH (ref 0.00–0.07)
Basophils Absolute: 0.1 K/uL (ref 0.0–0.1)
Basophils Relative: 0 %
Eosinophils Absolute: 0 K/uL (ref 0.0–0.5)
Eosinophils Relative: 0 %
HCT: 26.4 % — ABNORMAL LOW (ref 36.0–46.0)
Hemoglobin: 8.6 g/dL — ABNORMAL LOW (ref 12.0–15.0)
Immature Granulocytes: 1 %
Lymphocytes Relative: 4 %
Lymphs Abs: 1.1 K/uL (ref 0.7–4.0)
MCH: 30.2 pg (ref 26.0–34.0)
MCHC: 32.6 g/dL (ref 30.0–36.0)
MCV: 92.6 fL (ref 80.0–100.0)
Monocytes Absolute: 1.4 K/uL — ABNORMAL HIGH (ref 0.1–1.0)
Monocytes Relative: 6 %
Neutro Abs: 21.5 K/uL — ABNORMAL HIGH (ref 1.7–7.7)
Neutrophils Relative %: 89 %
Platelets: 272 K/uL (ref 150–400)
RBC: 2.85 MIL/uL — ABNORMAL LOW (ref 3.87–5.11)
RDW: 15 % (ref 11.5–15.5)
WBC: 24.4 K/uL — ABNORMAL HIGH (ref 4.0–10.5)
nRBC: 0 % (ref 0.0–0.2)

## 2024-04-15 LAB — GLUCOSE, CAPILLARY
Glucose-Capillary: 108 mg/dL — ABNORMAL HIGH (ref 70–99)
Glucose-Capillary: 114 mg/dL — ABNORMAL HIGH (ref 70–99)
Glucose-Capillary: 119 mg/dL — ABNORMAL HIGH (ref 70–99)
Glucose-Capillary: 123 mg/dL — ABNORMAL HIGH (ref 70–99)
Glucose-Capillary: 131 mg/dL — ABNORMAL HIGH (ref 70–99)
Glucose-Capillary: 134 mg/dL — ABNORMAL HIGH (ref 70–99)

## 2024-04-15 LAB — AEROBIC/ANAEROBIC CULTURE W GRAM STAIN (SURGICAL/DEEP WOUND): Culture: NO GROWTH

## 2024-04-15 MED ORDER — ALTEPLASE 2 MG IJ SOLR
2.0000 mg | Freq: Once | INTRAMUSCULAR | Status: AC
  Administered 2024-04-15: 2 mg
  Filled 2024-04-15: qty 2

## 2024-04-15 MED ORDER — SODIUM CHLORIDE 0.9 % IV SOLN
1.0000 g | Freq: Three times a day (TID) | INTRAVENOUS | Status: DC
  Administered 2024-04-15 – 2024-04-16 (×3): 1 g via INTRAVENOUS
  Filled 2024-04-15 (×4): qty 20

## 2024-04-15 MED ORDER — SODIUM CHLORIDE (PF) 0.9 % IJ SOLN
INTRAMUSCULAR | Status: AC
  Filled 2024-04-15: qty 50

## 2024-04-15 MED ORDER — IOHEXOL 300 MG/ML  SOLN
100.0000 mL | Freq: Once | INTRAMUSCULAR | Status: AC | PRN
  Administered 2024-04-15: 100 mL via INTRAVENOUS

## 2024-04-15 MED ORDER — TRAVASOL 10 % IV SOLN
INTRAVENOUS | Status: AC
  Filled 2024-04-15: qty 826.8

## 2024-04-15 MED ORDER — ALTEPLASE 2 MG IJ SOLR
2.0000 mg | Freq: Once | INTRAMUSCULAR | Status: AC
  Administered 2024-04-15: 2 mg
  Filled 2024-04-15 (×2): qty 2

## 2024-04-15 NOTE — Progress Notes (Signed)
 Daily Progress Note   Date: 04/15/2024   Patient Name: Angelica [name]  DOB: 1940/08/14  MRN: [id number]  Age / Sex: 83 y.o., female  Attending Physician: Rojelio Nest, DO Primary Care Physician: Norleen Lynwood ORN, MD Admit Date: 03/29/2024 Length of Stay: 17 days  Reason for Follow-up: Establishing goals of care  Past Medical History:  Diagnosis Date   Allergy 05/21/1978   Arthritis    Asthma    Bowel obstruction (HCC)    Cancer (HCC)    Cataract    Environmental allergies    GERD (gastroesophageal reflux disease)    Glaucoma 05/09/2017   H. pylori infection    History of radiation therapy 07/05/20-08/05/20   Left lung IMRT Dr. Shannon    HLD (hyperlipidemia) 01/16/2019   Hypertension    Iron  deficiency anemia    Osteopenia 05/26/2017   Thyroid  disease    Urticaria 07/25/2018    Subjective:   Subjective: Chart Reviewed. Updates received. Patient Assessed. Created space and opportunity for patient  and family to explore thoughts and feelings regarding current medical situation.  Today's Discussion: Today before meeting with the patient/family, I reviewed the chart notes including internal medicine note from yesterday, nursing note from yesterday, surgical PA note from yesterday, dietitian note from yesterday, OT note from yesterday, TOC note from yesterday, surgical PA note from today, IR nurse practitioner note from today, hospitalist note from today. I also reviewed vital signs, nursing flowsheets, medication administrations record, labs, and imaging. Labs reviewed include serial blood glucose readings in the 110s to 130s in the setting of TPN and poor oral intake.  Today saw the patient at bedside, her granddaughter was present.  The patient is uncomfortable appearing.  She states her abdomen is hurting and she took a pain pill recently but she is still having significant pain.  I verified the EMR and she did have an oxycodone  5 mg p.o. about an hour and a half ago.  She  states her pain is still hurting pretty bad.  I did call out and requested nurse to bring a dose of morphine  which she has available and has not needed since the middle of the night.  We spent time talking about goals of care and reviewing previous decisions including full code and full scope of care.  We spent time talking about wanting to get out of the hospital but the need to have improved oral intake to go to get off TPN.  We described TPN as a barrier to discharge to SNF/rehab and route to home.  We spent time talking about continuing to encourage oral intake to support her goals of strengthening and leaving the hospital.  Shared with palliative medicine will check in sometime next week.  Encouraged him to call for any questions or concerns while admitted. I provided emotional and general support through therapeutic listening, empathy, sharing of stories, and other techniques. I answered all questions and addressed all concerns to the best of my ability.  Review of Systems  Respiratory:  Negative for shortness of breath.   Cardiovascular:  Negative for chest pain.  Gastrointestinal:  Positive for abdominal pain. Negative for nausea and vomiting.    Objective:   Primary Diagnoses: Present on Admission:  Adenocarcinoma of left lung, stage 3 (HCC)  Alzheimer's disease (HCC)  Asthma, severe persistent, well-controlled (HCC)  Constipation  GERD (gastroesophageal reflux disease)  HLD (hyperlipidemia)  HTN (hypertension)  Hypothyroidism  Pleural effusion associated with pulmonary infection vs. recurrent malignancy and sepsis  Malnutrition of moderate degree  SBO (small bowel obstruction) (HCC)  Hypokalemia   Vital Signs:  BP 121/69 (BP Location: Left Arm)   Pulse 82   Temp 99 F (37.2 C) (Oral)   Resp 16   Ht 5' 5 (1.651 m)   Wt 62.7 kg   SpO2 98%   BMI 23.00 kg/m   Physical Exam Vitals and nursing note reviewed.  Constitutional:      General: She is not in acute  distress.    Appearance: She is ill-appearing.  HENT:     Head: Normocephalic and atraumatic.  Cardiovascular:     Rate and Rhythm: Normal rate.  Pulmonary:     Effort: Pulmonary effort is normal. No respiratory distress.  Abdominal:     General: Abdomen is flat.     Tenderness: There is abdominal tenderness.  Skin:    General: Skin is warm and dry.  Neurological:     General: No focal deficit present.     Mental Status: She is alert and oriented to person, place, and time.  Psychiatric:        Mood and Affect: Mood normal.        Behavior: Behavior normal.     Palliative Assessment/Data: 50%   Existing Vynca/ACP Documentation: Advance directive signed 06/30/2020  Assessment & Plan:   HPI/Patient Profile:  83 y.o. 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  She presented to the ED on 03/29/2024 with weakness. She is admitted with a large highly loculated left pleural effusion and small bowel obstruction.   Palliative Medicine has been consulted for goals of care discussions and complex medical decision making.  SUMMARY OF RECOMMENDATIONS   Full code Full scope of care Ongoing calorie count for nutritional status Ongoing TPN Anticipate SNF/rehab when patient able to increase oral intake to indicate need for TPN Palliative medicine will check in next week Please notify us  of any significant changes or new palliative needs in the interim  Symptom Management:  Per primary team Palliative medicine is available to assist as needed  Code Status: Full Code  Prognosis: Unable to determine  Discharge Planning: SNF/Rehab  Discussed with: Patient, family, medical team, nursing team  Thank you for allowing us  to participate in the care of Angelica [name] PMT will continue to support holistically.  Time Total: 30 min  Detailed  review of medical records (labs, imaging, vital signs), medically appropriate exam, discussed with treatment team, counseling and education to patient, family, & staff, documenting clinical information, medication management, coordination of care  Camellia Kays, NP Palliative Medicine Team  Team Phone # [phone] (Nights/Weekends)  01/17/2021, 8:17 AM

## 2024-04-15 NOTE — Progress Notes (Signed)
 PROGRESS NOTE    Angelica [name]  FMW:[id number] DOB: January 06, 1941 DOA: 03/29/2024 PCP: Norleen Lynwood ORN, MD     Brief Narrative:  Angelica [name] is an 83yo with h/o GERD, HTN, HLD, IDA, asthma, stage 3b adenocarcinoma of the lung, hypothyroidism, CVA, Alzheimer's dementia, and hx of SBO due to adhesions in 2016 and 2021 who presented on 11/9 with weakness and breast swelling. She was noted to have leukocytosis with lactate of 2.1.  CXR with near-complete opacification of the left lung, differential includes large malignant pleural effusion, central hilar mass with obstruction, lobar atelectasis, and/or pneumonia.  CT A/P with large highly located left pleural effusion with compressive atelectasis throughout the left lung.  She was started on antibiotics, pulmonary consulted.  11/11 also found to have SBO, NG tube placed, surgery consulted.  Underwent lysis of adhesions with small bowel resection on 11/14.  On TPN.    New events last 24 hours / Subjective: No new issues, Dementia at baseline   Assessment & Plan:  Principal Problem:   Pleural effusion associated with pulmonary infection vs. recurrent malignancy and sepsis Active Problems:   Weakness   Adenocarcinoma of left lung, stage 3 (HCC)   HTN (hypertension)   Elevated troponin   HLD (hyperlipidemia)   History of stroke   Hypothyroidism   Asthma, severe persistent, well-controlled (HCC)   Alzheimer's disease (HCC)   GERD (gastroesophageal reflux disease)   Constipation   SBO (small bowel obstruction) (HCC)   Malnutrition of moderate degree   Hypokalemia   Sepsis (HCC)   Prediabetes   Sinus bradycardia   Electrolyte abnormality   Pressure injury of sacral region, stage 2 (HCC)   Acute urinary retention   Pleural effusion - Insetting of pulmonary infection versus recurrent malignancy - Chest tube placed 11/12, removed 11/15 - Fluid studies suggest possible infectious etiology due to elevated WBC count, elevated LDH,  elevated protein fluid but culture negative.  Cytology negative - Appreciate PCCM, now signed off - Vancomycin , cefepime  --> Merrem    SBO s/p ex lap with small bowel resection on 11/14 - Post operative abdominal fluid collections s/p LUQ and LLQ fluid collection drains placed on 11/21 by Dr Jenna. Call IR APP or MD on call prior to dc to ensure appropriate follow up. Patient will need IR clinic follow up in 10-14 days.  - Advanced to full liquid diet. Remains on TPN. General surgery following   Demand ischemia - Stable  Hyperlipidemia - Pravachol    History of stroke - Plavix  currently on hold  History of adenocarcinoma of left lung, stage III - Diagnosed in 2022 and received chemoradiation at that time  Alzheimer's disease  GERD - Protonix   Prediabetes - A1c 5.9 - Sliding scale insulin   Hypothyroidism - Synthroid    Generalized weakness, deconditioning - PT OT - SNF recommended  Acute urinary retention -Foley catheter, Flomax   Pressure injury sacral region stage II, not present on admission   DVT prophylaxis:  enoxaparin  (LOVENOX ) injection 40 mg Start: 04/11/24 1600 SCDs Start: 03/29/24 1721  Code Status: Full Family Communication: None at bedside  Disposition Plan: SNF Status is: Inpatient Remains inpatient appropriate because: TPN     Antimicrobials:  Anti-infectives (From admission, onward)    Start     Dose/Rate Route Frequency Ordered Stop   04/15/24 1400  meropenem  (MERREM ) 1 g in sodium chloride  0.9 % 100 mL IVPB        1 g 200 mL/hr over 30 Minutes Intravenous Every 8 hours 04/15/24  0915     04/06/24 1300  metroNIDAZOLE  (FLAGYL ) IVPB 500 mg  Status:  Discontinued        500 mg 100 mL/hr over 60 Minutes Intravenous Every 12 hours 04/06/24 1217 04/15/24 0915   04/03/24 1330  metroNIDAZOLE  (FLAGYL ) IVPB 500 mg        500 mg 100 mL/hr over 60 Minutes Intravenous On call to O.R. 04/03/24 1242 04/03/24 1507   03/30/24 2200  vancomycin   (VANCOREADY) IVPB 750 mg/150 mL  Status:  Discontinued        750 mg 150 mL/hr over 60 Minutes Intravenous Every 24 hours 03/29/24 1620 04/03/24 1310   03/30/24 0400  ceFEPIme  (MAXIPIME ) 2 g in sodium chloride  0.9 % 100 mL IVPB  Status:  Discontinued        2 g 200 mL/hr over 30 Minutes Intravenous Every 12 hours 03/29/24 1620 04/15/24 0915   03/29/24 1530  vancomycin  (VANCOCIN ) IVPB 1000 mg/200 mL premix        1,000 mg 200 mL/hr over 60 Minutes Intravenous  Once 03/29/24 1517 03/30/24 0228   03/29/24 1530  ceFEPIme  (MAXIPIME ) 2 g in sodium chloride  0.9 % 100 mL IVPB        2 g 200 mL/hr over 30 Minutes Intravenous  Once 03/29/24 1518 03/29/24 1724        Objective: Vitals:   04/14/24 2026 04/14/24 2148 04/15/24 0356 04/15/24 1117  BP:  (!) 153/67 121/69   Pulse:  92 82   Resp:  17 16   Temp:  98.8 F (37.1 C) 99 F (37.2 C)   TempSrc:  Oral Oral   SpO2: 98% 100% 100% 98%  Weight:      Height:        Intake/Output Summary (Last 24 hours) at 04/15/2024 1254 Last data filed at 04/15/2024 1246 Gross per 24 hour  Intake 2477.39 ml  Output 1255 ml  Net 1222.39 ml   Filed Weights   04/03/24 1327 04/07/24 0400 04/09/24 0524  Weight: 59.6 kg 58.8 kg 62.7 kg    Examination:  General exam: Appears calm and comfortable  Respiratory system: Clear to auscultation. Respiratory effort normal. No respiratory distress. No conversational dyspnea.  Cardiovascular system: S1 & S2 heard, RRR. No murmurs. No pedal edema. Gastrointestinal system: Abdomen is nondistended Central nervous system: Alert  Extremities: Symmetric in appearance   Data Reviewed: I have personally reviewed following labs and imaging studies  CBC: Recent Labs  Lab 04/09/24 1039 04/10/24 1023 04/13/24 0245  WBC 38.1* 34.8* 17.4*  HGB 11.3* 9.8* 8.3*  HCT 33.7* 29.4* 25.2*  MCV 88.5 89.6 89.7  PLT 249 203 232   Basic Metabolic Panel: Recent Labs  Lab 04/10/24 1023 04/11/24 0335 04/12/24 0237  04/13/24 0245 04/14/24 0401  NA 134* 135 134* 136 136  K 3.7 3.9 4.2 3.9 4.2  CL 105 106 104 103 103  CO2 22 23 24 26 27   GLUCOSE 139* 150* 220* 137* 133*  BUN 15 17 16 17 20   CREATININE 0.51 0.49 0.52 0.55 0.52  CALCIUM  7.9* 8.0* 8.0* 8.2* 8.1*  MG 1.6* 2.1 1.8 1.9 1.8  PHOS 1.9* 2.2* 2.4* 2.5 2.5   GFR: Estimated Creatinine Clearance: 47.1 mL/min (by C-G formula based on SCr of 0.52 mg/dL). Liver Function Tests: Recent Labs  Lab 04/09/24 1039 04/13/24 0245  AST 17 15  ALT <5 6  ALKPHOS 58 51  BILITOT 0.5 0.3  PROT 5.9* 5.7*  ALBUMIN  2.1* 2.3*   No results  for input(s): LIPASE, AMYLASE in the last 168 hours. No results for input(s): AMMONIA in the last 168 hours. Coagulation Profile: No results for input(s): INR, PROTIME in the last 168 hours. Cardiac Enzymes: No results for input(s): CKTOTAL, CKMB, CKMBINDEX, TROPONINI in the last 168 hours. BNP (last 3 results) No results for input(s): PROBNP in the last 8760 hours. HbA1C: No results for input(s): HGBA1C in the last 72 hours. CBG: Recent Labs  Lab 04/14/24 2100 04/14/24 2357 04/15/24 0354 04/15/24 0807 04/15/24 1151  GLUCAP 143* 131* 134* 114* 119*   Lipid Profile: Recent Labs    04/13/24 0246  TRIG 47   Thyroid  Function Tests: No results for input(s): TSH, T4TOTAL, FREET4, T3FREE, THYROIDAB in the last 72 hours. Anemia Panel: No results for input(s): VITAMINB12, FOLATE, FERRITIN, TIBC, IRON , RETICCTPCT in the last 72 hours. Sepsis Labs: No results for input(s): PROCALCITON, LATICACIDVEN in the last 168 hours.  Recent Results (from the past 240 hours)  Culture, blood (Routine X 2) w Reflex to ID Panel     Status: None   Collection Time: 04/06/24  8:53 AM   Specimen: BLOOD  Result Value Ref Range Status   Specimen Description   Final    BLOOD BLOOD RIGHT HAND Performed at [facility], [address], [place], [place] [address]     Special Requests   Final    BOTTLES DRAWN AEROBIC ONLY Blood Culture results may not be optimal due to an inadequate volume of blood received in culture bottles Performed at [facility], [address], [place], [place] [address]    Culture   Final    NO GROWTH 5 DAYS Performed at [facility], [address], [place], [place] [address]    Report Status 04/11/2024 FINAL  Final  Culture, blood (Routine X 2) w Reflex to ID Panel     Status: None   Collection Time: 04/06/24  8:57 AM   Specimen: BLOOD  Result Value Ref Range Status   Specimen Description   Final    BLOOD BLOOD RIGHT HAND Performed at [facility], [address], [place], [place] [address]    Special Requests   Final    BOTTLES DRAWN AEROBIC ONLY Blood Culture results may not be optimal due to an inadequate volume of blood received in culture bottles Performed at [facility], [address], [place], [place] [address]    Culture   Final    NO GROWTH 5 DAYS Performed at [facility], [address] [place], [place] [address]    Report Status 04/11/2024 FINAL  Final  Aerobic/Anaerobic Culture w Gram Stain (surgical/deep wound)     Status: None   Collection Time: 04/10/24  1:52 PM   Specimen: Abscess  Result Value Ref Range Status   Specimen Description   Final    ABSCESS Performed at [facility], [address] [place], [place] [address]    Special Requests   Final     drain 2 Performed at [facility], [address] [place], [place] [address]    Gram Stain   Final    FEW WBC PRESENT, PREDOMINANTLY PMN NO ORGANISMS SEEN    Culture   Final    No growth aerobically or anaerobically. Performed at [facility], [address], [place], [place] [address]    Report Status 04/15/2024 FINAL  Final  Aerobic/Anaerobic Culture w Gram Stain (surgical/deep  wound)     Status: None (Preliminary result)    Collection Time: 04/10/24  1:52 PM   Specimen: Abscess  Result Value Ref Range Status   Specimen Description   Final    ABSCESS Performed at [facility], [address], [place], [place] [address]    Special Requests   Final    DRAIN 1 Performed at [facility], [address] [place], [place] [address]    Gram Stain   Final    MODERATE WBC PRESENT, PREDOMINANTLY PMN NO ORGANISMS SEEN Performed at [facility], [address], [place], [place] [address]    Culture   Final    RARE ACHROMOBACTER SPECIES Sent to [facility] for further susceptibility testing. NO ANAEROBES ISOLATED; CULTURE IN PROGRESS FOR 5 DAYS    Report Status PENDING  Incomplete      Radiology Studies: VAS US  UPPER EXTREMITY VENOUS DUPLEX Result Date: 04/15/2024 UPPER VENOUS STUDY  Patient Name:  VARSHINI ARRANTS  Date of Exam:   04/14/2024 Medical Rec #: [id number]         Accession #:    [id number] Date of Birth: 1941/01/12          Patient Gender: F Patient Age:   60 years Exam Location:  [facility] Procedure:      VAS US  UPPER EXTREMITY VENOUS DUPLEX Referring Phys: DELON YATES --------------------------------------------------------------------------------  Indications: Edema, and lung cancer. Comparison Study: No prior exam of the right upper extremity on file. Performing Technologist: Edilia Elden Appl  Examination Guidelines: A complete evaluation includes B-mode imaging, spectral Doppler, color Doppler, and power Doppler as needed of all accessible portions of each vessel. Bilateral testing is considered an integral part of a complete examination. Limited examinations for reoccurring indications may be performed as noted.  Right Findings: +----------+------------+---------+-----------+----------+---------------+ RIGHT     CompressiblePhasicitySpontaneousProperties    Summary      +----------+------------+---------+-----------+----------+---------------+ IJV                      Yes       Yes                Resistance.   +----------+------------+---------+-----------+----------+---------------+ Subclavian    Full       Yes       No                   IV line     +----------+------------+---------+-----------+----------+---------------+ Axillary      None       No        No               Distal segment  +----------+------------+---------+-----------+----------+---------------+ Brachial      Full       Yes       Yes                              +----------+------------+---------+-----------+----------+---------------+ Radial        Full                                                  +----------+------------+---------+-----------+----------+---------------+ Ulnar         Full                                                  +----------+------------+---------+-----------+----------+---------------+  Cephalic      None       Yes       Yes              At antecubital. +----------+------------+---------+-----------+----------+---------------+ Basilic       Full       Yes       Yes                              +----------+------------+---------+-----------+----------+---------------+ Deep vein thrombosis noted in the distal portion of the axillary vein, all other segments are compressible. Focal superficial vein thrombosis noted in the cephalic vein at the antecubital fossa, all other segments are compressible.  Left Findings: +----------+------------+---------+-----------+----------+-----------+ LEFT      CompressiblePhasicitySpontaneousProperties  Summary   +----------+------------+---------+-----------+----------+-----------+ IJV                      Yes       Yes              Resistance. +----------+------------+---------+-----------+----------+-----------+ Subclavian    Full       Yes       Yes                           +----------+------------+---------+-----------+----------+-----------+  Summary:  Right: Findings consistent with acute deep vein thrombosis involving the right axillary vein. Findings consistent with acute superficial vein thrombosis involving the right cephalic vein.  Left: No evidence of thrombosis in the subclavian.  *See table(s) above for measurements and observations.  Diagnosing physician: Lonni Gaskins MD Electronically signed by Lonni Gaskins MD on 04/15/2024 at 7:49:17 AM.    Final       Scheduled Meds:  budesonide  (PULMICORT ) nebulizer solution  0.5 mg Nebulization BID   Chlorhexidine  Gluconate Cloth  6 each Topical Q0600   enoxaparin  (LOVENOX ) injection  40 mg Subcutaneous Q24H   feeding supplement ([employer] STANDARD ENT 1.4)  325 mL Oral BID BM   fluticasone   1 spray Each Nare Daily   insulin  aspart  0-15 Units Subcutaneous Q4H   ipratropium  2 spray Each Nare TID   levothyroxine   50 mcg Oral QAC breakfast   methocarbamol  (ROBAXIN ) injection  500 mg Intravenous Q8H   montelukast   10 mg Oral QHS   pantoprazole  (PROTONIX ) IV  40 mg Intravenous Q12H   polyethylene glycol  17 g Oral Daily   pravastatin   80 mg Oral Daily   sodium chloride  flush  10-40 mL Intracatheter Q12H   sodium chloride  flush  5 mL Intracatheter Q8H   tamsulosin   0.4 mg Oral Daily   Continuous Infusions:  meropenem  (MERREM ) IV     TPN ADULT (ION) 65 mL/hr at 04/14/24 1843   TPN ADULT (ION)       LOS: 17 days   Time spent: 35 minutes   Delon Hoe, DO [facility] 04/15/2024, 12:54 PM   Available via Epic secure chat 7am-7pm After these hours, please refer to coverage provider listed on [email or web address]

## 2024-04-15 NOTE — Progress Notes (Signed)
 PHARMACY - TOTAL PARENTERAL NUTRITION CONSULT NOTE   Indication: Prolonged ileus  Patient Measurements: Height: 5' 5 (165.1 cm) Weight: 62.7 kg (138 lb 3.7 oz) IBW/kg (Calculated) : 57 TPN AdjBW (KG): 59.6 Body mass index is 23 kg/m.  Assessment: Presented with abdominal pain and weakness - 11/14: Ex lap with LOA, SBR and repair - Prolonged post-op ileus with additional post-op complications including IP abscesses.  Glucose / Insulin : No h/o DM. A1c 5.9% -BG goal <180. BG range: 114-155 -SSI given: 13 units  Electrolytes: ( labs on 11/25) -Na now WNL on max concentration in TPN -All other electrolytes WNL, including K 4.2, Mg 1.8, Phos 2.5, CorrCa (9.5) -Goal electrolytes with ileus: K >/= 4, Mg >/=2  Renal: Scr <1, BUN WNL and stable  Hepatic: LFTs, Alk Phos, T.bili all WNL. Albumin  low - Triglycerides 47  Intake / Output; MIVF:  -mIVF: None -po: (FLD) 20 ml noted. One Mallie Pinion charted -UOP: 1350 mL,  -Drains: 55ml - LBM: 11/13 vs 11/22 discrepancy in documentation  GI Imaging: 11/11 CT: SBO 11/13: AbdXR: SBO 11/14: AbdXR: SBO 11/21: CT: resolution of the previously noted small bowel obstruction. Loculated, rim enhancing intraperitoneal fluid collection adjacent to the anastomosis concerning for a postoperative abscess. Second similar loculated, rim enhancing peritoneal fluid collection in the LLQ  GI Surgeries / Procedures:  -11/14: Ex lap with LOA, SBR and repair -11/21: IR placement of drains for abdominal abscesses  Central access: Double lumen PICC placed 11/18 TPN start date:  04/07/24  Nutritional Goals: Goal TPN rate is 65 mL/hr (provides 83 g of protein and 1446 kcals per day)  RD Assessment: Estimated Needs Total Energy Estimated Needs: 1500-1700 kcals Total Protein Estimated Needs: 80-95 Total Fluid Estimated Needs: >/= 1.5L  Current Nutrition:  TPN (lipids held 11/18-11/24, started 11/24 PM)  - 11/23: FLD - 11/24: Calorie count: Patient  will meet greater than or equal to 90% of their needs.  - Add [employer] 1.4 PO BID, each supplement provides 455 kcal and 20 grams protein.   Lipid recommendations in TPN: - Peanut allergy: Use SMOFlipid with potential cross reactivity risk with soybean - Fish Allergy: (non-anaphylactic): Use Intralipid  - Shellfish Allergy (non-anaphylactic): If fish allergy confirmed or the patient avoids all seafood, Use Intralipid   - For the short-term (< 7-10 days), benefits/risks can be discussed with patient or intravenous fat emulsions can be held (11/18-11/24) --patient did receive Intralipid  in January 2022.  Plan:   At 18:00: Continue TPN to goal 65 mL/hr  11/24 PM Add Intralipid  (received in 2022) Will provide 82g protein and 1597 kcal Electrolytes in TPN: No changes Na 150 mEq/L, K 50 mEq/L, Ca 5 mEq/L, Mg 10 mEq/L, and Phos 15 mmol/L.  Cl:Ac 1:2 Add standard MVI and trace elements to TPN Continue moderate SSI q4h and adjust as needed  Monitor TPN labs on Mon/Thurs and PRN (next Thurs) F/u Calorie count    Dolphus Roller, PharmD, BCPS 04/15/2024 10:17 AM

## 2024-04-15 NOTE — Progress Notes (Signed)
 Referring Physician(s): Tanda Locus  Supervising Physician: Jennefer Rover  Patient Status:  [facility] - In-pt  Chief Complaint: post operative abdominal fluid collections s/p LUQ and LLQ fluid collection drains placed on 04/10/2024 by Dr Jenna  Subjective: Patient lying in bed. Daughter at bedside. Denies pain at drain sites.   Allergies: Asa buff (mag [buffered aspirin], Ensure [nutritional supplements], Fish allergy, Other, Peanut-containing drug products, Penicillins, Shellfish protein-containing drug products, and Strawberry extract  Medications: Prior to Admission medications   Medication Sig Start Date End Date Taking? Authorizing Provider  acetaminophen  (TYLENOL ) 500 MG tablet Take 1,000 mg by mouth every 6 (six) hours as needed for moderate pain.   Yes [provider]  albuterol  (VENTOLIN  HFA) 108 (90 Base) MCG/ACT inhaler Inhale 2 puffs into the lungs every 4 (four) hours as needed for wheezing or shortness of breath. 01/03/24  Yes Padgett, Danita Macintosh, MD  alum & mag hydroxide-simeth (MAALOX/MYLANTA) 200-200-20 MG/5ML suspension Take 15 mLs by mouth every 6 (six) hours as needed for indigestion or heartburn.   Yes [provider]  amLODipine  (NORVASC ) 10 MG tablet Take 1 tablet (10 mg total) by mouth daily. 06/17/23  Yes Norleen Lynwood ORN, MD  benralizumab  (FASENRA ) 30 MG/ML prefilled syringe Inject 1 mL (30 mg total) into the skin every 8 (eight) weeks. 07/15/23  Yes Padgett, Danita Macintosh, MD  cetirizine  [facility] ALLERGY RELIEF, CETIRIZINE ,) 10 MG tablet Take 1 tablet (10 mg total) by mouth daily. 01/03/24  Yes Jeneal Danita Macintosh, MD  clopidogrel  (PLAVIX ) 75 MG tablet Take 1 tablet by mouth once daily 07/18/23  Yes Norleen Lynwood ORN, MD  diclofenac  Sodium (VOLTAREN ) 1 % GEL Apply 4 g topically 4 (four) times daily. Patient taking differently: Apply 4 g topically 4 (four) times daily as needed (pain). 04/23/22  Yes Corey, Evan S, MD  EPINEPHrine  (EPIPEN  2-PAK)  0.3 mg/0.3 mL IJ SOAJ injection Use as directed for severe allergic reaction 01/03/24  Yes Padgett, Danita Macintosh, MD  fluticasone  (FLONASE ) 50 MCG/ACT nasal spray USE 1 SPRAY(S) IN EACH NOSTRIL IN THE MORNING AND AT BEDTIME Patient taking differently: Place 1 spray into both nostrils daily. 01/14/24  Yes Jeneal Danita Macintosh, MD  ipratropium (ATROVENT ) 0.06 % nasal spray Place 2 sprays into both nostrils 3 (three) times daily as needed for rhinitis. 01/03/24  Yes Jeneal Danita Macintosh, MD  levothyroxine  (SYNTHROID ) 50 MCG tablet TAKE 1 TABLET BY MOUTH ONCE DAILY BEFORE BREAKFAST 03/10/24  Yes Norleen Lynwood ORN, MD  memantine  (NAMENDA ) 10 MG tablet Take 1 tablet (10 mg total) by mouth 2 (two) times daily. 10/30/23  Yes McCue, Harlene, NP  montelukast  (SINGULAIR ) 10 MG tablet Take 1 tablet (10 mg total) by mouth at bedtime. 01/03/24  Yes Padgett, Danita Macintosh, MD  ondansetron  (ZOFRAN -ODT) 4 MG disintegrating tablet Take 1 tablet (4 mg total) by mouth every 8 (eight) hours as needed for nausea or vomiting. 07/09/22  Yes Merlynn Eland F, FNP  SYMBICORT  80-4.5 MCG/ACT inhaler INHALE 2 PUFFS BY MOUTH IN THE MORNING 01/03/24  Yes Padgett, Danita Macintosh, MD  gentamicin  ointment (GARAMYCIN ) 0.1 % Apply 1 Application topically daily. Apply to wound daily between toes.  Use a very small amount. Patient not taking: Reported on 03/10/2024 08/27/23   McCaughan, Dia D, DPM  Magnesium  Hydroxide (PHILLIPS MILK OF MAGNESIA PO) Take 15-30 mLs by mouth daily as needed (for constipation).    [provider]  pantoprazole  (PROTONIX ) 40 MG tablet Take 1 tablet by mouth once daily 03/31/24  Norleen Lynwood ORN, MD  potassium chloride  SA (KLOR-CON  M) 20 MEQ tablet Take 2 tablets (40 mEq total) by mouth daily as needed. Take potasium days you take lasix . Patient not taking: Reported on 03/10/2024 01/03/23   Norleen Lynwood ORN, MD  pravastatin  (PRAVACHOL ) 80 MG tablet Take 1 tablet by mouth once daily 04/09/24   Norleen Lynwood ORN, MD     Vital Signs: BP 121/69 (BP Location: Left Arm)   Pulse 82   Temp 99 F (37.2 C) (Oral)   Resp 16   Ht 5' 5 (1.651 m)   Wt 138 lb 3.7 oz (62.7 kg)   SpO2 98%   BMI 23.00 kg/m   Physical Exam Cardiovascular:     Rate and Rhythm: Normal rate.  Pulmonary:     Effort: Pulmonary effort is normal. No respiratory distress.  Abdominal:     Palpations: Abdomen is soft.     Comments: Abdominal midline surgical site with dressing present - clean, dry, and intact  Skin:    Comments:  LUQ drain- Flushes/aspirates easily.  Scant serosanguinous drainage in the bulb. Insertion site unremarkable. Suture and stat lock in place. Dressed appropriately.   LLQ drain- Flushes easily.  Unable to aspirate. Trace serous drainage in the bulb.  Trace leaking at insertion site with flushing, otherwise site unremarkable. Suture and stat lock in place. Dressed appropriately.   Neurological:     Mental Status: She is alert. She is disoriented.     Imaging: VAS US  UPPER EXTREMITY VENOUS DUPLEX Result Date: 04/15/2024 UPPER VENOUS STUDY  Patient Name:  ELONA YINGER  Date of Exam:   04/14/2024 Medical Rec #: [id number]         Accession #:    [id number] Date of Birth: [birth year]          Patient Gender: F Patient Age:   83 years Exam Location:  [facility] Procedure:      VAS US  UPPER EXTREMITY VENOUS DUPLEX Referring Phys: DELON YATES --------------------------------------------------------------------------------  Indications: Edema, and lung cancer. Comparison Study: No prior exam of the right upper extremity on file. Performing Technologist: Edilia Elden Appl  Examination Guidelines: A complete evaluation includes B-mode imaging, spectral Doppler, color Doppler, and power Doppler as needed of all accessible portions of each vessel. Bilateral testing is considered an integral part of a complete examination. Limited examinations for reoccurring indications may be  performed as noted.  Right Findings: +----------+------------+---------+-----------+----------+---------------+ RIGHT     CompressiblePhasicitySpontaneousProperties    Summary     +----------+------------+---------+-----------+----------+---------------+ IJV                      Yes       Yes                Resistance.   +----------+------------+---------+-----------+----------+---------------+ Subclavian    Full       Yes       No                   IV line     +----------+------------+---------+-----------+----------+---------------+ Axillary      None       No        No               Distal segment  +----------+------------+---------+-----------+----------+---------------+ Brachial      Full       Yes       Yes                              +----------+------------+---------+-----------+----------+---------------+  Radial        Full                                                  +----------+------------+---------+-----------+----------+---------------+ Ulnar         Full                                                  +----------+------------+---------+-----------+----------+---------------+ Cephalic      None       Yes       Yes              At antecubital. +----------+------------+---------+-----------+----------+---------------+ Basilic       Full       Yes       Yes                              +----------+------------+---------+-----------+----------+---------------+ Deep vein thrombosis noted in the distal portion of the axillary vein, all other segments are compressible. Focal superficial vein thrombosis noted in the cephalic vein at the antecubital fossa, all other segments are compressible.  Left Findings: +----------+------------+---------+-----------+----------+-----------+ LEFT      CompressiblePhasicitySpontaneousProperties  Summary   +----------+------------+---------+-----------+----------+-----------+ IJV                       Yes       Yes              Resistance. +----------+------------+---------+-----------+----------+-----------+ Subclavian    Full       Yes       Yes                          +----------+------------+---------+-----------+----------+-----------+  Summary:  Right: Findings consistent with acute deep vein thrombosis involving the right axillary vein. Findings consistent with acute superficial vein thrombosis involving the right cephalic vein.  Left: No evidence of thrombosis in the subclavian.  *See table(s) above for measurements and observations.  Diagnosing physician: Lonni Gaskins MD Electronically signed by Lonni Gaskins MD on 04/15/2024 at 7:49:17 AM.    Final     Labs:  CBC: Recent Labs    04/08/24 0425 04/09/24 1039 04/10/24 1023 04/13/24 0245  WBC 19.8* 38.1* 34.8* 17.4*  HGB 10.7* 11.3* 9.8* 8.3*  HCT 32.5* 33.7* 29.4* 25.2*  PLT 274 249 203 232    COAGS: Recent Labs    03/29/24 1835  INR 1.2    BMP: Recent Labs    04/11/24 0335 04/12/24 0237 04/13/24 0245 04/14/24 0401  NA 135 134* 136 136  K 3.9 4.2 3.9 4.2  CL 106 104 103 103  CO2 23 24 26 27   GLUCOSE 150* 220* 137* 133*  BUN 17 16 17 20   CALCIUM  8.0* 8.0* 8.2* 8.1*  CREATININE 0.49 0.52 0.55 0.52  GFRNONAA >60 >60 >60 >60    LIVER FUNCTION TESTS: Recent Labs    04/06/24 0853 04/08/24 0425 04/09/24 1039 04/13/24 0245  BILITOT 0.3 0.2 0.5 0.3  AST 21 15 17 15   ALT 6 5 <5 6  ALKPHOS 86 59 58 51  PROT 6.3* 5.8* 5.9* 5.7*  ALBUMIN  2.3* 2.1* 2.1* 2.3*  Assessment and Plan: Post operative abdominal fluid collections s/p LUQ and LLQ fluid collection drains placed on 04/10/2024 by Dr Jenna-  CT ordered 04/15/2024  Drain Location: LUQ, LLQ Size: Fr size: 10 Fr Date of placement: 04/10/2024  Currently to: Drain collection device: suction bulb 24 hour output:  Output by Drain (mL) 04/13/24 0701 - 04/13/24 1900 04/13/24 1901 - 04/14/24 0700 04/14/24 0701 - 04/14/24 1900  04/14/24 1901 - 04/15/24 0700 04/15/24 0701 - 04/15/24 1155  Closed System Drain 1 Left Hip Bulb (JP) 10 20 10 5    Closed System Drain 2 Lateral LLQ Bulb (JP) 5 20 10 5      Current examination: LUQ drain- Flushes/aspirates easily.  Scant serosanguinous drainage in the bulb. Insertion site unremarkable. Suture and stat lock in place. Dressed appropriately.   LLQ drain- Flushes easily.  Unable to aspirate. Trace serous drainage in the bulb.  Trace leaking at insertion site with flushing, otherwise site unremarkable. Suture and stat lock in place. Dressed appropriately.    Plan: Continue TID flushes with 5 cc NS. Record output Q shift. Dressing changes QD or PRN if soiled.  Call IR APP or on call IR MD if difficulty flushing or sudden change in drain output.  Repeat imaging/possible drain injection once output < 10 mL/QD (excluding flush material). Consideration for drain removal if output is < 10 mL/QD (excluding flush material), pending discussion with the providing surgical service.  Discharge planning: Please contact IR APP or on call IR MD prior to patient d/c to ensure appropriate follow up plans are in place. Typically patient will follow up with IR clinic 10-14 days post d/c for repeat imaging/possible drain injection. IR scheduler will contact patient with date/time of appointment. Patient will need to flush drain QD with 5 cc NS, record output QD, dressing changes every 2-3 days or earlier if soiled.   IR will continue to follow - please call with questions or concerns.   Electronically Signed: Johnye Kist B Josecarlos Harriott, NP 04/15/2024, 11:48 AM   I spent a total of 15 Minutes at the the patient's bedside AND on the patient's hospital floor or unit, greater than 50% of which was counseling/coordinating care for drain care follow up.

## 2024-04-15 NOTE — Progress Notes (Signed)
 Progress Note  12 Days Post-Op  Subjective: Daughter present during encounter.   Patient having some abdominal pain. Tolerating FLD. Has had bowel movement. Unsure if having flatulence. Denies nausea and vomiting.    ROS  All negative with the exception of above.  Objective: Vital signs in last 24 hours: Temp:  [98.1 F (36.7 C)-99 F (37.2 C)] 99 F (37.2 C) (11/26 0356) Pulse Rate:  [82-92] 82 (11/26 0356) Resp:  [16-20] 16 (11/26 0356) BP: (116-153)/(62-69) 121/69 (11/26 0356) SpO2:  [98 %-100 %] 100 % (11/26 0356) Last BM Date :  (PTA)  Intake/Output from previous day: 11/25 0701 - 11/26 0700 In: 2257.4 [P.O.:480; I.V.:1557.4; IV Piggyback:200] Out: 905 [Urine:875; Drains:30] Intake/Output this shift: No intake/output data recorded.  PE: General: Female who is laying in bed in NAD. HEENT: Head is normocephalic, atraumatic.  Heart: HR normal during encounter.   Lungs: Respiratory effort nonlabored. Abd: Soft with some distention. NT to palpation. Abdominal incision dressing C/D/I. No rebound tenderness or guarding. 2 drains noted of left abdomen. More superior drain is noted to have <25 mL of SS fluid. More inferior drain is noted to have <25 mL fluid present.  MS: Able to move all extremities.  Skin: Warm and dry.    Lab Results:  Recent Labs    04/13/24 0245  WBC 17.4*  HGB 8.3*  HCT 25.2*  PLT 232   BMET Recent Labs    04/13/24 0245 04/14/24 0401  NA 136 136  K 3.9 4.2  CL 103 103  CO2 26 27  GLUCOSE 137* 133*  BUN 17 20  CREATININE 0.55 0.52  CALCIUM  8.2* 8.1*   PT/INR No results for input(s): LABPROT, INR in the last 72 hours. CMP     Component Value Date/Time   NA 136 04/14/2024 0401   K 4.2 04/14/2024 0401   CL 103 04/14/2024 0401   CO2 27 04/14/2024 0401   GLUCOSE 133 (H) 04/14/2024 0401   BUN 20 04/14/2024 0401   CREATININE 0.52 04/14/2024 0401   CREATININE 0.96 12/27/2023 0757   CREATININE 0.86 11/02/2022 1604   CALCIUM   8.1 (L) 04/14/2024 0401   PROT 5.7 (L) 04/13/2024 0245   PROT 7.7 06/07/2017 0940   ALBUMIN  2.3 (L) 04/13/2024 0245   AST 15 04/13/2024 0245   AST 24 12/27/2023 0757   ALT 6 04/13/2024 0245   ALT 15 12/27/2023 0757   ALKPHOS 51 04/13/2024 0245   BILITOT 0.3 04/13/2024 0245   BILITOT 0.7 12/27/2023 0757   GFRNONAA >60 04/14/2024 0401   GFRNONAA 58 (L) 12/27/2023 0757   GFRAA >60 08/15/2017 0012   Lipase     Component Value Date/Time   LIPASE 30 10/09/2023 1042       Studies/Results: VAS US  UPPER EXTREMITY VENOUS DUPLEX Result Date: 04/15/2024 UPPER VENOUS STUDY  Patient Name:  Angelica [name]  Date of Exam:   04/14/2024 Medical Rec #: [id number]         Accession #:    [id number] Date of Birth: Nov 15, 1940          Patient Gender: F Patient Age:   83 years Exam Location:  [facility] Procedure:      VAS US  UPPER EXTREMITY VENOUS DUPLEX Referring Phys: DELON YATES --------------------------------------------------------------------------------  Indications: Edema, and lung cancer. Comparison Study: No prior exam of the right upper extremity on file. Performing Technologist: Edilia Elden Appl  Examination Guidelines: A complete evaluation includes B-mode imaging, spectral Doppler, color Doppler, and power  Doppler as needed of all accessible portions of each vessel. Bilateral testing is considered an integral part of a complete examination. Limited examinations for reoccurring indications may be performed as noted.  Right Findings: +----------+------------+---------+-----------+----------+---------------+ RIGHT     CompressiblePhasicitySpontaneousProperties    Summary     +----------+------------+---------+-----------+----------+---------------+ IJV                      Yes       Yes                Resistance.   +----------+------------+---------+-----------+----------+---------------+ Subclavian    Full       Yes       No                   IV line      +----------+------------+---------+-----------+----------+---------------+ Axillary      None       No        No               Distal segment  +----------+------------+---------+-----------+----------+---------------+ Brachial      Full       Yes       Yes                              +----------+------------+---------+-----------+----------+---------------+ Radial        Full                                                  +----------+------------+---------+-----------+----------+---------------+ Ulnar         Full                                                  +----------+------------+---------+-----------+----------+---------------+ Cephalic      None       Yes       Yes              At antecubital. +----------+------------+---------+-----------+----------+---------------+ Basilic       Full       Yes       Yes                              +----------+------------+---------+-----------+----------+---------------+ Deep vein thrombosis noted in the distal portion of the axillary vein, all other segments are compressible. Focal superficial vein thrombosis noted in the cephalic vein at the antecubital fossa, all other segments are compressible.  Left Findings: +----------+------------+---------+-----------+----------+-----------+ LEFT      CompressiblePhasicitySpontaneousProperties  Summary   +----------+------------+---------+-----------+----------+-----------+ IJV                      Yes       Yes              Resistance. +----------+------------+---------+-----------+----------+-----------+ Subclavian    Full       Yes       Yes                          +----------+------------+---------+-----------+----------+-----------+  Summary:  Right: Findings consistent with acute deep vein thrombosis involving the right axillary vein.  Findings consistent with acute superficial vein thrombosis involving the right cephalic vein.  Left: No evidence of  thrombosis in the subclavian.  *See table(s) above for measurements and observations.  Diagnosing physician: Lonni Gaskins MD Electronically signed by Lonni Gaskins MD on 04/15/2024 at 7:49:17 AM.    Final     Anti-infectives: Anti-infectives (From admission, onward)    Start     Dose/Rate Route Frequency Ordered Stop   04/06/24 1300  metroNIDAZOLE  (FLAGYL ) IVPB 500 mg        500 mg 100 mL/hr over 60 Minutes Intravenous Every 12 hours 04/06/24 1217     04/03/24 1330  metroNIDAZOLE  (FLAGYL ) IVPB 500 mg        500 mg 100 mL/hr over 60 Minutes Intravenous On call to O.R. 04/03/24 1242 04/03/24 1507   03/30/24 2200  vancomycin  (VANCOREADY) IVPB 750 mg/150 mL  Status:  Discontinued        750 mg 150 mL/hr over 60 Minutes Intravenous Every 24 hours 03/29/24 1620 04/03/24 1310   03/30/24 0400  ceFEPIme  (MAXIPIME ) 2 g in sodium chloride  0.9 % 100 mL IVPB        2 g 200 mL/hr over 30 Minutes Intravenous Every 12 hours 03/29/24 1620     03/29/24 1530  vancomycin  (VANCOCIN ) IVPB 1000 mg/200 mL premix        1,000 mg 200 mL/hr over 60 Minutes Intravenous  Once 03/29/24 1517 03/30/24 0228   03/29/24 1530  ceFEPIme  (MAXIPIME ) 2 g in sodium chloride  0.9 % 100 mL IVPB        2 g 200 mL/hr over 30 Minutes Intravenous  Once 03/29/24 1518 03/29/24 1724        Assessment/Plan POD 12, s/p ex lap with SBR, Dr. Teresa 11/14 -CT from 11/21 showed fluid collections of abdomen.  -Vitals stable. -WBC from 11/24: 17.4 from 34.8. -HGB from 11/24: 8.3 today from 9.8 from 11.3. CBC pending for today -Output of Lhip JP drain was 15 mL SS and LLQ drain noted to be 15 mL yellow/clear. Drain culture showing achromobacter and pharmacy planning to adjust abx from Cefepime  + Flagyl  to Meropenem  . IR recommended monitoring output of drains and if remain low over next 24-48 hours consider f/u CT to assess adequacy of drainage. Will discuss further with physician. -Tolerating FLD. Has had 1 bowel movement  recorded. Continue FLD at this time. -SLP recommended full amount of TNA at this time.  -Mirilax. -Mobilize as able -Will continue to follow   FEN - FLD/TNA/IVFs per primary VTE - Lovenox  ID - Meropenem ; monitor WBC and HGB   HTN HLD h/o CVA Alzheimer's disease  Stage III adenocarcinoma of lung GERD Hypothyroidism PCM   LOS: 17 days   I reviewed hospitalist notes, specialist notes, nursing notes, last 24 h vitals and pain scores, last 48 h intake and output, last 24 h labs and trends, and last 24 h imaging results.   Angelica [name], [facility] 04/15/2024, 9:11 AM Please see Amion for pager number during day hours 7:00am-4:30pm

## 2024-04-16 DIAGNOSIS — J918 Pleural effusion in other conditions classified elsewhere: Secondary | ICD-10-CM | POA: Diagnosis not present

## 2024-04-16 DIAGNOSIS — J189 Pneumonia, unspecified organism: Secondary | ICD-10-CM | POA: Diagnosis not present

## 2024-04-16 LAB — COMPREHENSIVE METABOLIC PANEL WITH GFR
ALT: 7 U/L (ref 0–44)
AST: 17 U/L (ref 15–41)
Albumin: 2.2 g/dL — ABNORMAL LOW (ref 3.5–5.0)
Alkaline Phosphatase: 51 U/L (ref 38–126)
Anion gap: 5 (ref 5–15)
BUN: 23 mg/dL (ref 8–23)
CO2: 29 mmol/L (ref 22–32)
Calcium: 8.2 mg/dL — ABNORMAL LOW (ref 8.9–10.3)
Chloride: 100 mmol/L (ref 98–111)
Creatinine, Ser: 0.56 mg/dL (ref 0.44–1.00)
GFR, Estimated: >60 mL/min (ref >60)
Glucose, Bld: 111 mg/dL — ABNORMAL HIGH (ref 70–99)
Potassium: 4.3 mmol/L (ref 3.5–5.1)
Sodium: 134 mmol/L — ABNORMAL LOW (ref 135–145)
Total Bilirubin: <0.2 mg/dL (ref 0.0–1.2)
Total Protein: 5.5 g/dL — ABNORMAL LOW (ref 6.5–8.1)

## 2024-04-16 LAB — CBC
HCT: 22.8 % — ABNORMAL LOW (ref 36.0–46.0)
Hemoglobin: 7.5 g/dL — ABNORMAL LOW (ref 12.0–15.0)
MCH: 30.2 pg (ref 26.0–34.0)
MCHC: 32.9 g/dL (ref 30.0–36.0)
MCV: 91.9 fL (ref 80.0–100.0)
Platelets: 268 K/uL (ref 150–400)
RBC: 2.48 MIL/uL — ABNORMAL LOW (ref 3.87–5.11)
RDW: 15.5 % (ref 11.5–15.5)
WBC: 21 K/uL — ABNORMAL HIGH (ref 4.0–10.5)
nRBC: 0 % (ref 0.0–0.2)

## 2024-04-16 LAB — GLUCOSE, CAPILLARY
Glucose-Capillary: 109 mg/dL — ABNORMAL HIGH (ref 70–99)
Glucose-Capillary: 119 mg/dL — ABNORMAL HIGH (ref 70–99)
Glucose-Capillary: 123 mg/dL — ABNORMAL HIGH (ref 70–99)
Glucose-Capillary: 129 mg/dL — ABNORMAL HIGH (ref 70–99)
Glucose-Capillary: 156 mg/dL — ABNORMAL HIGH (ref 70–99)
Glucose-Capillary: 157 mg/dL — ABNORMAL HIGH (ref 70–99)

## 2024-04-16 LAB — PHOSPHORUS: Phosphorus: 2.5 mg/dL (ref 2.5–4.6)

## 2024-04-16 LAB — MAGNESIUM: Magnesium: 2 mg/dL (ref 1.7–2.4)

## 2024-04-16 MED ORDER — ENOXAPARIN SODIUM 60 MG/0.6ML IJ SOSY
60.0000 mg | PREFILLED_SYRINGE | Freq: Two times a day (BID) | INTRAMUSCULAR | Status: DC
  Administered 2024-04-16 – 2024-04-21 (×12): 60 mg via SUBCUTANEOUS
  Filled 2024-04-16 (×13): qty 0.6

## 2024-04-16 MED ORDER — TRAVASOL 10 % IV SOLN
INTRAVENOUS | Status: AC
  Filled 2024-04-16: qty 445.2

## 2024-04-16 MED ORDER — ENOXAPARIN (LOVENOX) PATIENT EDUCATION KIT
PACK | Freq: Once | Status: AC
  Filled 2024-04-16: qty 1

## 2024-04-16 MED ORDER — HYDROCORTISONE 1 % EX CREA
TOPICAL_CREAM | Freq: Two times a day (BID) | CUTANEOUS | Status: DC
  Filled 2024-04-16 (×2): qty 28

## 2024-04-16 MED ORDER — PANTOPRAZOLE SODIUM 40 MG PO TBEC
40.0000 mg | DELAYED_RELEASE_TABLET | Freq: Two times a day (BID) | ORAL | Status: DC
  Administered 2024-04-16 – 2024-04-23 (×16): 40 mg via ORAL
  Filled 2024-04-16 (×15): qty 1

## 2024-04-16 MED ORDER — DIPHENHYDRAMINE HCL 25 MG PO CAPS
25.0000 mg | ORAL_CAPSULE | Freq: Three times a day (TID) | ORAL | Status: DC | PRN
  Administered 2024-04-19 – 2024-04-20 (×2): 25 mg via ORAL
  Filled 2024-04-16 (×2): qty 1

## 2024-04-16 MED ORDER — SODIUM CHLORIDE 0.9 % IV SOLN
1.0000 g | Freq: Two times a day (BID) | INTRAVENOUS | Status: AC
  Administered 2024-04-16 – 2024-04-22 (×13): 1 g via INTRAVENOUS
  Filled 2024-04-16 (×14): qty 20

## 2024-04-16 NOTE — Progress Notes (Signed)
 Pt right arm where her Picc line is swollen and has a red dot on the Right Forearm will let doctor know due to daughter concerns and per daughter never received results on the X ray of the arm to see if she had a blood clot

## 2024-04-16 NOTE — Progress Notes (Signed)
 PHARMACY - TOTAL PARENTERAL NUTRITION CONSULT NOTE   Indication: Prolonged ileus  Patient Measurements: Height: 5' 5 (165.1 cm) Weight: 62.7 kg (138 lb 3.7 oz) IBW/kg (Calculated) : 57 TPN AdjBW (KG): 59.6 Body mass index is 23 kg/m.  Assessment: Presented with abdominal pain and weakness - 11/14: Ex lap with LOA, SBR and repair - Prolonged post-op ileus with additional post-op complications including IP abscesses.  Glucose / Insulin : No h/o DM. A1c 5.9% -BG goal <180. BG range: 108-157 -SSI given: 5 units  Electrolytes: ( labs on 11/25) -Na 134 on max concentration in TPN -All other electrolytes WNL, K 4.3, Mg 2, Phos 2.5, CorrCa (9.5) -Goal electrolytes with ileus: K >/= 4, Mg >/=2  Renal: Scr <1, BUN WNL and stable  Hepatic: LFTs, Alk Phos, T.bili all WNL. Albumin  low - Triglycerides 47  Intake / Output; MIVF:  -mIVF: None -po: (FLD) 840. ZERO [employer] suppl charted -UOP: 1100 mL,  - Emesis x 1 (11/26) -Drains: 68ml, Consideration for drain removal if output is < 10 mL/QD  - LBM: 11/26  GI Imaging: 11/11 CT: SBO 11/13: AbdXR: SBO 11/14: AbdXR: SBO 11/21: CT: resolution of the previously noted small bowel obstruction. Loculated, rim enhancing intraperitoneal fluid collection adjacent to the anastomosis concerning for a postoperative abscess. Second similar loculated, rim enhancing peritoneal fluid collection in the LLQ  GI Surgeries / Procedures:  -11/14: Ex lap with LOA, SBR and repair -11/21: IR placement of drains for abdominal abscesses  Central access: Double lumen PICC placed 11/18 TPN start date:  04/07/24  Nutritional Goals: Goal TPN rate is 65 mL/hr (provides 83 g of protein and 1446 kcals per day)  RD Assessment: Estimated Needs Total Energy Estimated Needs: 1500-1700 kcals Total Protein Estimated Needs: 80-95 Total Fluid Estimated Needs: >/= 1.5L  Current Nutrition:  TPN (lipids held 11/18-11/24, started 11/24 PM)  - 11/23: FLD - 11/24:  Calorie count: Patient will meet greater than or equal to 90% of their needs.  - Add [employer] 1.4 PO BID, each supplement provides 455 kcal and 20 grams protein.   Lipid recommendations in TPN: - Peanut allergy: Use SMOFlipid with potential cross reactivity risk with soybean - Fish Allergy: (non-anaphylactic): Use Intralipid  - Shellfish Allergy (non-anaphylactic): If fish allergy confirmed or the patient avoids all seafood, Use Intralipid   - For the short-term (< 7-10 days), benefits/risks can be discussed with patient or intravenous fat emulsions can be held (11/18-11/24) --patient did receive Intralipid  in January 2022.  Plan:  Wean TPN to 35 mL/hr -no changes to formula today 11/24 Add Intralipid  (received in 2022) Will provide 82g protein and 1597 kcal Electrolytes in TPN: No changes Na 150 mEq/L, K 50 mEq/L, Ca 5 mEq/L, Mg 10 mEq/L, and Phos 15 mmol/L.  Cl:Ac 1:2 Add standard MVI and trace elements to TPN Continue moderate SSI q4h and adjust as needed  Monitor TPN labs on Mon/Thurs and PRN (next Thurs)  Briannah Lona Karoline Marina, PharmD, BCPS Clinical Staff Pharmacist 04/16/2024 8:00 AM

## 2024-04-16 NOTE — Progress Notes (Signed)
 13 Days Post-Op   Subjective/Chief Complaint: Family at bedside, reports she tolerated full liquids well Having bm's Concerned about so right forearm swelling   Objective: Vital signs in last 24 hours: Temp:  [98.3 F (36.8 C)-99 F (37.2 C)] 98.5 F (36.9 C) (11/27 0446) Pulse Rate:  [78-100] 78 (11/27 0446) Resp:  [15-17] 15 (11/27 0446) BP: (115-116)/(54-60) 115/54 (11/27 0446) SpO2:  [96 %-99 %] 97 % (11/27 0446) Last BM Date : 04/15/24  Intake/Output from previous day: 11/26 0701 - 11/27 0700 In: 1980.5 [P.O.:840; I.V.:920.5; IV Piggyback:200] Out: 1138 [Urine:1100; Drains:38] Intake/Output this shift: No intake/output data recorded.  Exam: Awake and alert Abdomen soft, dressing in place, drains serous  Lab Results:  Recent Labs    04/15/24 1508  WBC 24.4*  HGB 8.6*  HCT 26.4*  PLT 272   BMET Recent Labs    04/14/24 0401 04/16/24 0327  NA 136 134*  K 4.2 4.3  CL 103 100  CO2 27 29  GLUCOSE 133* 111*  BUN 20 23  CREATININE 0.52 0.56  CALCIUM  8.1* 8.2*   PT/INR No results for input(s): LABPROT, INR in the last 72 hours. ABG No results for input(s): PHART, HCO3 in the last 72 hours.  Invalid input(s): PCO2, PO2  Studies/Results: CT ABDOMEN PELVIS W CONTRAST Result Date: 04/15/2024 EXAM: CT ABDOMEN AND PELVIS WITH CONTRAST 04/15/2024 04:21:00 PM TECHNIQUE: CT of the abdomen and pelvis was performed with the administration of 100 mL of iohexol  (OMNIPAQUE ) 300 MG/ML solution. Multiplanar reformatted images are provided for review. Automated exposure control, iterative reconstruction, and/or weight-based adjustment of the mA/kV was utilized to reduce the radiation dose to as low as reasonably achievable. COMPARISON: 04/10/2024 CLINICAL HISTORY: s/p drainage of LUQ/LLQ abdominal fluid collections 11/21 FINDINGS: LOWER CHEST: Small bilateral pleural effusions, stable since prior study. Compressive atelectasis in the lower lobes. Coronary artery  calcifications. LIVER: The liver is unremarkable. GALLBLADDER AND BILE DUCTS: Gallbladder is unremarkable. No biliary ductal dilatation. SPLEEN: No acute abnormality. PANCREAS: No acute abnormality. ADRENAL GLANDS: No acute abnormality. KIDNEYS, URETERS AND BLADDER: No stones in the kidneys or ureters. No hydronephrosis. No perinephric or periureteral stranding. Foley catheter in the bladder, which is decompressed. GI AND BOWEL: Postoperative changes in left abdominal small bowel loops. Stomach demonstrates no acute abnormality. There is no bowel obstruction. PERITONEUM AND RETROPERITONEUM: Previously seen left fluid collections have been drained with drainage catheters in place. The larger left lower quadrant fluid collection has decreased significantly in size measuring maximally 1.4 cm compared to 3.8 x 3.2 cm previously. The smaller anterior left upper quadrant fluid collection is no longer visible. No free air. VASCULATURE: Aorta is normal in caliber. LYMPH NODES: No lymphadenopathy. REPRODUCTIVE ORGANS: No acute abnormality. BONES AND SOFT TISSUES: No acute osseous abnormality. No focal soft tissue abnormality. IMPRESSION: 1. Status post drainage of LUQ/LLQ abdominal fluid collections with drainage catheters in place, with marked interval decrease of the larger collection to 1.4 cm and resolution of the smaller collection. 2. Small bilateral pleural effusions, stable since prior study. Electronically signed by: Franky Crease MD 04/15/2024 10:14 PM EST RP Workstation: [id number]   VAS US  UPPER EXTREMITY VENOUS DUPLEX Result Date: 04/15/2024 UPPER VENOUS STUDY  Patient Name:  Angelica [name]  Date of Exam:   04/14/2024 Medical Rec #: [id number]         Accession #:    [id number] Date of Birth: January 07, 1941          Patient Gender: F Patient Age:  83 years Exam Location:  [facility] Procedure:      VAS US  UPPER EXTREMITY VENOUS DUPLEX Referring Phys: DELON YATES  --------------------------------------------------------------------------------  Indications: Edema, and lung cancer. Comparison Study: No prior exam of the right upper extremity on file. Performing Technologist: Edilia Elden Appl  Examination Guidelines: A complete evaluation includes B-mode imaging, spectral Doppler, color Doppler, and power Doppler as needed of all accessible portions of each vessel. Bilateral testing is considered an integral part of a complete examination. Limited examinations for reoccurring indications may be performed as noted.  Right Findings: +----------+------------+---------+-----------+----------+---------------+ RIGHT     CompressiblePhasicitySpontaneousProperties    Summary     +----------+------------+---------+-----------+----------+---------------+ IJV                      Yes       Yes                Resistance.   +----------+------------+---------+-----------+----------+---------------+ Subclavian    Full       Yes       No                   IV line     +----------+------------+---------+-----------+----------+---------------+ Axillary      None       No        No               Distal segment  +----------+------------+---------+-----------+----------+---------------+ Brachial      Full       Yes       Yes                              +----------+------------+---------+-----------+----------+---------------+ Radial        Full                                                  +----------+------------+---------+-----------+----------+---------------+ Ulnar         Full                                                  +----------+------------+---------+-----------+----------+---------------+ Cephalic      None       Yes       Yes              At antecubital. +----------+------------+---------+-----------+----------+---------------+ Basilic       Full       Yes       Yes                               +----------+------------+---------+-----------+----------+---------------+ Deep vein thrombosis noted in the distal portion of the axillary vein, all other segments are compressible. Focal superficial vein thrombosis noted in the cephalic vein at the antecubital fossa, all other segments are compressible.  Left Findings: +----------+------------+---------+-----------+----------+-----------+ LEFT      CompressiblePhasicitySpontaneousProperties  Summary   +----------+------------+---------+-----------+----------+-----------+ IJV                      Yes       Yes  Resistance. +----------+------------+---------+-----------+----------+-----------+ Subclavian    Full       Yes       Yes                          +----------+------------+---------+-----------+----------+-----------+  Summary:  Right: Findings consistent with acute deep vein thrombosis involving the right axillary vein. Findings consistent with acute superficial vein thrombosis involving the right cephalic vein.  Left: No evidence of thrombosis in the subclavian.  *See table(s) above for measurements and observations.  Diagnosing physician: Lonni Gaskins MD Electronically signed by Lonni Gaskins MD on 04/15/2024 at 7:49:17 AM.    Final     Anti-infectives: Anti-infectives (From admission, onward)    Start     Dose/Rate Route Frequency Ordered Stop   04/15/24 1400  meropenem  (MERREM ) 1 g in sodium chloride  0.9 % 100 mL IVPB        1 g 200 mL/hr over 30 Minutes Intravenous Every 8 hours 04/15/24 0915     04/06/24 1300  metroNIDAZOLE  (FLAGYL ) IVPB 500 mg  Status:  Discontinued        500 mg 100 mL/hr over 60 Minutes Intravenous Every 12 hours 04/06/24 1217 04/15/24 0915   04/03/24 1330  metroNIDAZOLE  (FLAGYL ) IVPB 500 mg        500 mg 100 mL/hr over 60 Minutes Intravenous On call to O.R. 04/03/24 1242 04/03/24 1507   03/30/24 2200  vancomycin  (VANCOREADY) IVPB 750 mg/150 mL  Status:  Discontinued         750 mg 150 mL/hr over 60 Minutes Intravenous Every 24 hours 03/29/24 1620 04/03/24 1310   03/30/24 0400  ceFEPIme  (MAXIPIME ) 2 g in sodium chloride  0.9 % 100 mL IVPB  Status:  Discontinued        2 g 200 mL/hr over 30 Minutes Intravenous Every 12 hours 03/29/24 1620 04/15/24 0915   03/29/24 1530  vancomycin  (VANCOCIN ) IVPB 1000 mg/200 mL premix        1,000 mg 200 mL/hr over 60 Minutes Intravenous  Once 03/29/24 1517 03/30/24 0228   03/29/24 1530  ceFEPIme  (MAXIPIME ) 2 g in sodium chloride  0.9 % 100 mL IVPB        2 g 200 mL/hr over 30 Minutes Intravenous  Once 03/29/24 1518 03/29/24 1724       Assessment/Plan: POD 13, s/p ex lap with SBR, Dr. Teresa 11/14   -advance to soft diet -wean TPN off -continue drains per IR.  CT yesterday improved -may need workup for forearm swelling, ?u/s.  Will defer to medical team Vicenta Poli 04/16/2024

## 2024-04-16 NOTE — Progress Notes (Signed)
 Patient's nurse stated that patient has blood clots on same arm with PICC. Our IV team nurse assessed Rt. Arm swollen than Lt. Arm on 11/25 and doppler was done same day. DVT in the distal portion of the axillary vein. Informed Dr. Rojelio this finding. Keep the PICC at this time due to TPN. Will order for new PICC. HS [employer]

## 2024-04-16 NOTE — Progress Notes (Addendum)
 PHARMACY - ANTICOAGULATION CONSULT NOTE  Pharmacy Consult for Lovenox  Indication: DVT  Allergies  Allergen Reactions   Asa Buff (Mag [Buffered Aspirin] Other (See Comments)    Excessive sweating   Ensure [Nutritional Supplements]     Or boost - upset stomach    Fish Allergy Other (See Comments)    Unknown reaction, but allergic. Has tolerated Intralipids in TpN 2022/2025   Other Other (See Comments)    Gelcaps; gel-containing capsules; extended-release medication - upset stomach    Peanut-Containing Drug Products Other (See Comments)    From allergy test, Has tolerated Intralipids in TpN 2022/2025    Penicillins Itching   Shellfish Protein-Containing Drug Products Other (See Comments)    From allergy test, Has tolerated Intralipids in TpN 2022/2025    Strawberry Extract Nausea And Vomiting and Other (See Comments)    From allergy test     Patient Measurements: Height: 5' 5 (165.1 cm) Weight: 62.7 kg (138 lb 3.7 oz) IBW/kg (Calculated) : 57 HEPARIN  DW (KG): 59.6  Vital Signs: Temp: 98.5 F (36.9 C) (11/27 0446) Temp Source: Oral (11/27 0446) BP: 115/54 (11/27 0446) Pulse Rate: 78 (11/27 0446)  Labs: Recent Labs    04/14/24 0401 04/15/24 1508 04/16/24 0327  HGB  --  8.6*  --   HCT  --  26.4*  --   PLT  --  272  --   CREATININE 0.52  --  0.56    Estimated Creatinine Clearance: 47.1 mL/min (by C-G formula based on SCr of 0.56 mg/dL).   Medical History: Past Medical History:  Diagnosis Date   Allergy 05/21/1978   Arthritis    Asthma    Bowel obstruction (HCC)    Cancer (HCC)    Cataract    Environmental allergies    GERD (gastroesophageal reflux disease)    Glaucoma 05/09/2017   H. pylori infection    History of radiation therapy 07/05/20-08/05/20   Left lung IMRT Dr. Shannon    HLD (hyperlipidemia) 01/16/2019   Hypertension    Iron  deficiency anemia    Osteopenia 05/26/2017   Thyroid  disease    Urticaria 07/25/2018    Assessment: AC/Heme:  enox40>60mg /12h  - Hgb 8.6 (watch plts, baseline 400s) - 11/27: R forearm swelling. ,PICC area swollen/red.  DVT in R anxillary vein with acute superficial vein thrombosis involving  the right cephalic vein.   Goal of Therapy:  Anti-Xa level 0.6-1 units/ml 4hrs after LMWH dose given Monitor platelets by anticoagulation protocol: Yes   Plan:  Lovenox  60mg  SQ q12 hrs CBC q72h while on LMWH  Ruthvik Barnaby Karoline Marina, PharmD, BCPS Clinical Staff Pharmacist   Marina Salines Stillinger 04/16/2024,10:32 AM

## 2024-04-16 NOTE — Progress Notes (Signed)
 PROGRESS NOTE    Angelica [name]  FMW:[id number] DOB: Oct 14, 1940 DOA: 03/29/2024 PCP: Norleen Lynwood ORN, MD     Brief Narrative:  Angelica [name] is an 83yo with h/o GERD, HTN, HLD, IDA, asthma, stage 3b adenocarcinoma of the lung, hypothyroidism, CVA, Alzheimer's dementia, and hx of SBO due to adhesions in 2016 and 2021 who presented on 11/9 with weakness and breast swelling. She was noted to have leukocytosis with lactate of 2.1.  CXR with near-complete opacification of the left lung, differential includes large malignant pleural effusion, central hilar mass with obstruction, lobar atelectasis, and/or pneumonia.  CT A/P with large highly located left pleural effusion with compressive atelectasis throughout the left lung.  She was started on antibiotics, pulmonary consulted.  11/11 also found to have SBO, NG tube placed, surgery consulted.  Underwent lysis of adhesions with small bowel resection on 11/14.  On TPN.    New events last 24 hours / Subjective: Family at bedside getting her cleaned up. Asking for medication for itchy skin. Also with swollen right forearm. Diet has been advanced to soft.   Assessment & Plan:  Principal Problem:   Pleural effusion associated with pulmonary infection vs. recurrent malignancy and sepsis Active Problems:   Weakness   Adenocarcinoma of left lung, stage 3 (HCC)   HTN (hypertension)   Elevated troponin   HLD (hyperlipidemia)   History of stroke   Hypothyroidism   Asthma, severe persistent, well-controlled (HCC)   Alzheimer's disease (HCC)   GERD (gastroesophageal reflux disease)   Constipation   SBO (small bowel obstruction) (HCC)   Malnutrition of moderate degree   Hypokalemia   Sepsis (HCC)   Prediabetes   Sinus bradycardia   Electrolyte abnormality   Pressure injury of sacral region, stage 2 (HCC)   Acute urinary retention   Pleural effusion - Insetting of pulmonary infection versus recurrent malignancy - Chest tube placed 11/12, removed  11/15 - Fluid studies suggest possible infectious etiology due to elevated WBC count, elevated LDH, elevated protein fluid but culture negative.  Cytology negative - Appreciate PCCM, now signed off  SBO s/p ex lap with small bowel resection on 11/14 - Post operative abdominal fluid collections s/p LUQ and LLQ fluid collection drains placed on 11/21 by Dr Jenna. Call IR APP or MD on call prior to dc to ensure appropriate follow up. Patient will need IR clinic follow up in 10-14 days.  - Advanced to soft diet. Hopeful to wean off TPN. General surgery following - Abscess culture showing achromobacter. Discussed with pharmacy. Changed vancomycin /cefepime  --> Merrem     RUE DVT - Full dose lovenox    Demand ischemia - Stable  Hyperlipidemia - Pravachol    History of stroke - Plavix  currently on hold  History of adenocarcinoma of left lung, stage III - Diagnosed in 2022 and received chemoradiation at that time  Alzheimer's disease  GERD - Protonix   Prediabetes - A1c 5.9 - Sliding scale insulin   Hypothyroidism - Synthroid    Generalized weakness, deconditioning - PT OT - SNF recommended  Acute urinary retention -Foley catheter, Flomax   Pressure injury sacral region stage II, not present on admission   DVT prophylaxis:  SCDs Start: 03/29/24 1721  Code Status: Full Family Communication: at bedside  Disposition Plan: SNF Status is: Inpatient Remains inpatient appropriate because: TPN     Antimicrobials:  Anti-infectives (From admission, onward)    Start     Dose/Rate Route Frequency Ordered Stop   04/16/24 1800  meropenem  (MERREM ) 1 g in sodium  chloride 0.9 % 100 mL IVPB        1 g 200 mL/hr over 30 Minutes Intravenous Every 12 hours 04/16/24 1056     04/15/24 1400  meropenem  (MERREM ) 1 g in sodium chloride  0.9 % 100 mL IVPB  Status:  Discontinued        1 g 200 mL/hr over 30 Minutes Intravenous Every 8 hours 04/15/24 0915 04/16/24 1056   04/06/24 1300   metroNIDAZOLE  (FLAGYL ) IVPB 500 mg  Status:  Discontinued        500 mg 100 mL/hr over 60 Minutes Intravenous Every 12 hours 04/06/24 1217 04/15/24 0915   04/03/24 1330  metroNIDAZOLE  (FLAGYL ) IVPB 500 mg        500 mg 100 mL/hr over 60 Minutes Intravenous On call to O.R. 04/03/24 1242 04/03/24 1507   03/30/24 2200  vancomycin  (VANCOREADY) IVPB 750 mg/150 mL  Status:  Discontinued        750 mg 150 mL/hr over 60 Minutes Intravenous Every 24 hours 03/29/24 1620 04/03/24 1310   03/30/24 0400  ceFEPIme  (MAXIPIME ) 2 g in sodium chloride  0.9 % 100 mL IVPB  Status:  Discontinued        2 g 200 mL/hr over 30 Minutes Intravenous Every 12 hours 03/29/24 1620 04/15/24 0915   03/29/24 1530  vancomycin  (VANCOCIN ) IVPB 1000 mg/200 mL premix        1,000 mg 200 mL/hr over 60 Minutes Intravenous  Once 03/29/24 1517 03/30/24 0228   03/29/24 1530  ceFEPIme  (MAXIPIME ) 2 g in sodium chloride  0.9 % 100 mL IVPB        2 g 200 mL/hr over 30 Minutes Intravenous  Once 03/29/24 1518 03/29/24 1724        Objective: Vitals:   04/15/24 1637 04/15/24 1833 04/15/24 2008 04/16/24 0446  BP: 116/60  (!) 115/57 (!) 115/54  Pulse: 80  100 78  Resp: 15  17 15   Temp: 98.3 F (36.8 C)  99 F (37.2 C) 98.5 F (36.9 C)  TempSrc:   Oral Oral  SpO2:  96% 99% 97%  Weight:      Height:        Intake/Output Summary (Last 24 hours) at 04/16/2024 1146 Last data filed at 04/16/2024 0659 Gross per 24 hour  Intake 1860.48 ml  Output 788 ml  Net 1072.48 ml   Filed Weights   04/03/24 1327 04/07/24 0400 04/09/24 0524  Weight: 59.6 kg 58.8 kg 62.7 kg    Examination:  General exam: Appears uncomfortable  Gastrointestinal system: Abdomen is nondistended Central nervous system: Alert  Extremities: Right forearm swollen, warm to touch, without significant erythema   Data Reviewed: I have personally reviewed following labs and imaging studies  CBC: Recent Labs  Lab 04/10/24 1023 04/13/24 0245 04/15/24 1508   WBC 34.8* 17.4* 24.4*  NEUTROABS  --   --  21.5*  HGB 9.8* 8.3* 8.6*  HCT 29.4* 25.2* 26.4*  MCV 89.6 89.7 92.6  PLT 203 232 272   Basic Metabolic Panel: Recent Labs  Lab 04/11/24 0335 04/12/24 0237 04/13/24 0245 04/14/24 0401 04/16/24 0327  NA 135 134* 136 136 134*  K 3.9 4.2 3.9 4.2 4.3  CL 106 104 103 103 100  CO2 23 24 26 27 29   GLUCOSE 150* 220* 137* 133* 111*  BUN 17 16 17 20 23   CREATININE 0.49 0.52 0.55 0.52 0.56  CALCIUM  8.0* 8.0* 8.2* 8.1* 8.2*  MG 2.1 1.8 1.9 1.8 2.0  PHOS 2.2* 2.4* 2.5  2.5 2.5   GFR: Estimated Creatinine Clearance: 47.1 mL/min (by C-G formula based on SCr of 0.56 mg/dL). Liver Function Tests: Recent Labs  Lab 04/13/24 0245 04/16/24 0327  AST 15 17  ALT 6 7  ALKPHOS 51 51  BILITOT 0.3 <0.2  PROT 5.7* 5.5*  ALBUMIN  2.3* 2.2*   No results for input(s): LIPASE, AMYLASE in the last 168 hours. No results for input(s): AMMONIA in the last 168 hours. Coagulation Profile: No results for input(s): INR, PROTIME in the last 168 hours. Cardiac Enzymes: No results for input(s): CKTOTAL, CKMB, CKMBINDEX, TROPONINI in the last 168 hours. BNP (last 3 results) No results for input(s): PROBNP in the last 8760 hours. HbA1C: No results for input(s): HGBA1C in the last 72 hours. CBG: Recent Labs  Lab 04/15/24 1649 04/15/24 2041 04/16/24 0004 04/16/24 0447 04/16/24 0755  GLUCAP 123* 108* 157* 109* 129*   Lipid Profile: No results for input(s): CHOL, HDL, LDLCALC, TRIG, CHOLHDL, LDLDIRECT in the last 72 hours.  Thyroid  Function Tests: No results for input(s): TSH, T4TOTAL, FREET4, T3FREE, THYROIDAB in the last 72 hours. Anemia Panel: No results for input(s): VITAMINB12, FOLATE, FERRITIN, TIBC, IRON , RETICCTPCT in the last 72 hours. Sepsis Labs: No results for input(s): PROCALCITON, LATICACIDVEN in the last 168 hours.  Recent Results (from the past 240 hours)  Aerobic/Anaerobic  Culture w Gram Stain (surgical/deep wound)     Status: None   Collection Time: 04/10/24  1:52 PM   Specimen: Abscess  Result Value Ref Range Status   Specimen Description   Final    ABSCESS Performed at [facility], [address], [place], [place] [address]    Special Requests   Final     drain 2 Performed at [facility], [address], [place], [place] [address]    Gram Stain   Final    FEW WBC PRESENT, PREDOMINANTLY PMN NO ORGANISMS SEEN    Culture   Final    No growth aerobically or anaerobically. Performed at [facility], [address] [place], [place] [address]    Report Status 04/15/2024 FINAL  Final  Aerobic/Anaerobic Culture w Gram Stain (surgical/deep wound)     Status: None (Preliminary result)   Collection Time: 04/10/24  1:52 PM   Specimen: Abscess  Result Value Ref Range Status   Specimen Description   Final    ABSCESS Performed at [facility], [address] [place], [place] [address]    Special Requests   Final    DRAIN 1 Performed at [facility], [address], [place], [place] [address]    Gram Stain   Final    MODERATE WBC PRESENT, PREDOMINANTLY PMN NO ORGANISMS SEEN Performed at [facility], [address], [place], [place] [address]    Culture   Final    RARE ACHROMOBACTER SPECIES Sent to [facility] for further susceptibility testing. NO ANAEROBES ISOLATED; CULTURE IN PROGRESS FOR 5 DAYS    Report Status PENDING  Incomplete      Radiology Studies: CT ABDOMEN PELVIS W CONTRAST Result Date: 04/15/2024 EXAM: CT ABDOMEN AND PELVIS WITH CONTRAST 04/15/2024 04:21:00 PM TECHNIQUE: CT of the abdomen and pelvis was performed with the administration of 100 mL of iohexol  (OMNIPAQUE ) 300 MG/ML solution. Multiplanar reformatted images are provided for review. Automated exposure control, iterative reconstruction, and/or weight-based adjustment of the mA/kV was  utilized to reduce the radiation dose to as low as reasonably achievable. COMPARISON: 04/10/2024  CLINICAL HISTORY: s/p drainage of LUQ/LLQ abdominal fluid collections 11/21 FINDINGS: LOWER CHEST: Small bilateral pleural effusions, stable since prior study. Compressive atelectasis in the lower lobes. Coronary artery calcifications. LIVER: The liver is unremarkable. GALLBLADDER AND BILE DUCTS: Gallbladder is unremarkable. No biliary ductal dilatation. SPLEEN: No acute abnormality. PANCREAS: No acute abnormality. ADRENAL GLANDS: No acute abnormality. KIDNEYS, URETERS AND BLADDER: No stones in the kidneys or ureters. No hydronephrosis. No perinephric or periureteral stranding. Foley catheter in the bladder, which is decompressed. GI AND BOWEL: Postoperative changes in left abdominal small bowel loops. Stomach demonstrates no acute abnormality. There is no bowel obstruction. PERITONEUM AND RETROPERITONEUM: Previously seen left fluid collections have been drained with drainage catheters in place. The larger left lower quadrant fluid collection has decreased significantly in size measuring maximally 1.4 cm compared to 3.8 x 3.2 cm previously. The smaller anterior left upper quadrant fluid collection is no longer visible. No free air. VASCULATURE: Aorta is normal in caliber. LYMPH NODES: No lymphadenopathy. REPRODUCTIVE ORGANS: No acute abnormality. BONES AND SOFT TISSUES: No acute osseous abnormality. No focal soft tissue abnormality. IMPRESSION: 1. Status post drainage of LUQ/LLQ abdominal fluid collections with drainage catheters in place, with marked interval decrease of the larger collection to 1.4 cm and resolution of the smaller collection. 2. Small bilateral pleural effusions, stable since prior study. Electronically signed by: Franky Crease MD 04/15/2024 10:14 PM EST RP Workstation: [id number]   VAS US  UPPER EXTREMITY VENOUS DUPLEX Result Date: 04/15/2024 UPPER VENOUS STUDY  Patient Name:  PHILIP ECKERSLEY  Date  of Exam:   04/14/2024 Medical Rec #: [id number]         Accession #:    [id number] Date of Birth: November 06, 1940          Patient Gender: F Patient Age:   39 years Exam Location:  [facility] Procedure:      VAS US  UPPER EXTREMITY VENOUS DUPLEX Referring Phys: DELON YATES --------------------------------------------------------------------------------  Indications: Edema, and lung cancer. Comparison Study: No prior exam of the right upper extremity on file. Performing Technologist: Edilia Elden Appl  Examination Guidelines: A complete evaluation includes B-mode imaging, spectral Doppler, color Doppler, and power Doppler as needed of all accessible portions of each vessel. Bilateral testing is considered an integral part of a complete examination. Limited examinations for reoccurring indications may be performed as noted.  Right Findings: +----------+------------+---------+-----------+----------+---------------+ RIGHT     CompressiblePhasicitySpontaneousProperties    Summary     +----------+------------+---------+-----------+----------+---------------+ IJV                      Yes       Yes                Resistance.   +----------+------------+---------+-----------+----------+---------------+ Subclavian    Full       Yes       No                   IV line     +----------+------------+---------+-----------+----------+---------------+ Axillary      None       No        No               Distal segment  +----------+------------+---------+-----------+----------+---------------+ Brachial      Full       Yes       Yes                              +----------+------------+---------+-----------+----------+---------------+  Radial        Full                                                  +----------+------------+---------+-----------+----------+---------------+ Ulnar         Full                                                   +----------+------------+---------+-----------+----------+---------------+ Cephalic      None       Yes       Yes              At antecubital. +----------+------------+---------+-----------+----------+---------------+ Basilic       Full       Yes       Yes                              +----------+------------+---------+-----------+----------+---------------+ Deep vein thrombosis noted in the distal portion of the axillary vein, all other segments are compressible. Focal superficial vein thrombosis noted in the cephalic vein at the antecubital fossa, all other segments are compressible.  Left Findings: +----------+------------+---------+-----------+----------+-----------+ LEFT      CompressiblePhasicitySpontaneousProperties  Summary   +----------+------------+---------+-----------+----------+-----------+ IJV                      Yes       Yes              Resistance. +----------+------------+---------+-----------+----------+-----------+ Subclavian    Full       Yes       Yes                          +----------+------------+---------+-----------+----------+-----------+  Summary:  Right: Findings consistent with acute deep vein thrombosis involving the right axillary vein. Findings consistent with acute superficial vein thrombosis involving the right cephalic vein.  Left: No evidence of thrombosis in the subclavian.  *See table(s) above for measurements and observations.  Diagnosing physician: Lonni Gaskins MD Electronically signed by Lonni Gaskins MD on 04/15/2024 at 7:49:17 AM.    Final       Scheduled Meds:  budesonide  (PULMICORT ) nebulizer solution  0.5 mg Nebulization BID   Chlorhexidine  Gluconate Cloth  6 each Topical Q0600   enoxaparin  (LOVENOX ) injection  60 mg Subcutaneous Q12H   enoxaparin    Does not apply Once   feeding supplement (KATE FARMS STANDARD ENT 1.4)  325 mL Oral BID BM   fluticasone   1 spray Each Nare Daily   hydrocortisone  cream   Topical  BID   insulin  aspart  0-15 Units Subcutaneous Q4H   ipratropium  2 spray Each Nare TID   levothyroxine   50 mcg Oral QAC breakfast   methocarbamol  (ROBAXIN ) injection  500 mg Intravenous Q8H   montelukast   10 mg Oral QHS   pantoprazole   40 mg Oral BID   polyethylene glycol  17 g Oral Daily   pravastatin   80 mg Oral Daily   sodium chloride  flush  10-40 mL Intracatheter Q12H   sodium chloride  flush  5 mL Intracatheter Q8H   tamsulosin   0.4 mg Oral Daily   Continuous Infusions:  meropenem  (  MERREM ) IV     TPN ADULT (ION) 65 mL/hr at 04/15/24 2043   TPN ADULT (ION)       LOS: 18 days   Time spent: 35 minutes   Delon Hoe, DO [facility] 04/16/2024, 11:46 AM   Available via Epic secure chat 7am-7pm After these hours, please refer to coverage provider listed on [email or web address]

## 2024-04-16 NOTE — Progress Notes (Signed)
 Pt had he lunch which was some baked chicken and string beans and she ate 80% of the meal she had no issues afterwards she is swallowing her pills whole

## 2024-04-17 ENCOUNTER — Inpatient Hospital Stay (HOSPITAL_COMMUNITY)

## 2024-04-17 ENCOUNTER — Other Ambulatory Visit: Payer: Self-pay

## 2024-04-17 DIAGNOSIS — J918 Pleural effusion in other conditions classified elsewhere: Secondary | ICD-10-CM | POA: Diagnosis not present

## 2024-04-17 DIAGNOSIS — J189 Pneumonia, unspecified organism: Secondary | ICD-10-CM | POA: Diagnosis not present

## 2024-04-17 LAB — GLUCOSE, CAPILLARY
Glucose-Capillary: 101 mg/dL — ABNORMAL HIGH (ref 70–99)
Glucose-Capillary: 107 mg/dL — ABNORMAL HIGH (ref 70–99)
Glucose-Capillary: 110 mg/dL — ABNORMAL HIGH (ref 70–99)
Glucose-Capillary: 130 mg/dL — ABNORMAL HIGH (ref 70–99)
Glucose-Capillary: 130 mg/dL — ABNORMAL HIGH (ref 70–99)
Glucose-Capillary: 145 mg/dL — ABNORMAL HIGH (ref 70–99)

## 2024-04-17 LAB — CBC
HCT: 22.3 % — ABNORMAL LOW (ref 36.0–46.0)
Hemoglobin: 7.3 g/dL — ABNORMAL LOW (ref 12.0–15.0)
MCH: 30.4 pg (ref 26.0–34.0)
MCHC: 32.7 g/dL (ref 30.0–36.0)
MCV: 92.9 fL (ref 80.0–100.0)
Platelets: 278 K/uL (ref 150–400)
RBC: 2.4 MIL/uL — ABNORMAL LOW (ref 3.87–5.11)
RDW: 15.5 % (ref 11.5–15.5)
WBC: 19.4 K/uL — ABNORMAL HIGH (ref 4.0–10.5)
nRBC: 0 % (ref 0.0–0.2)

## 2024-04-17 MED ORDER — INSULIN ASPART 100 UNIT/ML IJ SOLN
0.0000 [IU] | Freq: Three times a day (TID) | INTRAMUSCULAR | Status: DC
  Administered 2024-04-17 – 2024-04-18 (×2): 2 [IU] via SUBCUTANEOUS
  Filled 2024-04-17 (×2): qty 2

## 2024-04-17 NOTE — Progress Notes (Signed)
 PROGRESS NOTE    Angelica [name]  FMW:[id number] DOB: [birth year] DOA: 03/29/2024 PCP: Norleen Lynwood ORN, MD     Brief Narrative:  Angelica [name] is an 83yo with h/o GERD, HTN, HLD, IDA, asthma, stage 3b adenocarcinoma of the lung, hypothyroidism, CVA, Alzheimer's dementia, and hx of SBO due to adhesions in 2016 and 2021 who presented on 11/9 with weakness and breast swelling. She was noted to have leukocytosis with lactate of 2.1.  CXR with near-complete opacification of the left lung, differential includes large malignant pleural effusion, central hilar mass with obstruction, lobar atelectasis, and/or pneumonia.  CT A/P with large highly located left pleural effusion with compressive atelectasis throughout the left lung.  She was started on antibiotics, pulmonary consulted.  11/11 also found to have SBO, NG tube placed, surgery consulted.  Underwent lysis of adhesions with small bowel resection on 11/14.  On TPN.    New events last 24 hours / Subjective: No complaints on exam today. Per report, her PO intake is improving.   Assessment & Plan:  Principal Problem:   Pleural effusion associated with pulmonary infection vs. recurrent malignancy and sepsis Active Problems:   Weakness   Adenocarcinoma of left lung, stage 3 (HCC)   HTN (hypertension)   Elevated troponin   HLD (hyperlipidemia)   History of stroke   Hypothyroidism   Asthma, severe persistent, well-controlled (HCC)   Alzheimer's disease (HCC)   GERD (gastroesophageal reflux disease)   Constipation   SBO (small bowel obstruction) (HCC)   Malnutrition of moderate degree   Hypokalemia   Sepsis (HCC)   Prediabetes   Sinus bradycardia   Electrolyte abnormality   Pressure injury of sacral region, stage 2 (HCC)   Acute urinary retention   Pleural effusion - Insetting of pulmonary infection versus recurrent malignancy - Chest tube placed 11/12, removed 11/15 - Fluid studies suggest possible infectious etiology due to elevated  WBC count, elevated LDH, elevated protein fluid but culture negative.  Cytology negative - Appreciate PCCM, now signed off  SBO s/p ex lap with small bowel resection on 11/14 - Post operative abdominal fluid collections s/p LUQ and LLQ fluid collection drains placed on 11/21 by Dr Jenna. Call IR APP or MD on call prior to dc to ensure appropriate follow up. Patient will need IR clinic follow up in 10-14 days.  - Advanced to soft diet. Hopeful to wean off TPN. General surgery following - Abscess culture showing achromobacter. Discussed with pharmacy. Changed vancomycin /cefepime  --> Merrem  . Susceptibility pending, sent out to [facility]  RUE DVT - Full dose lovenox   - Change PICC   Demand ischemia - Stable  Hyperlipidemia - Pravachol    History of stroke - Plavix  currently on hold  History of adenocarcinoma of left lung, stage III - Diagnosed in 2022 and received chemoradiation at that time  Alzheimer's disease  GERD - Protonix   Prediabetes - A1c 5.9 - Sliding scale insulin   Hypothyroidism - Synthroid    Generalized weakness, deconditioning - PT OT - SNF recommended  Acute urinary retention -Foley catheter, Flomax   Pressure injury sacral region stage II, not present on admission   DVT prophylaxis:  SCDs Start: 03/29/24 1721  Code Status: Full Family Communication: None at bedside  Disposition Plan: SNF Status is: Inpatient Remains inpatient appropriate because: TPN - hopeful dc to SNF soon.     Antimicrobials:  Anti-infectives (From admission, onward)    Start     Dose/Rate Route Frequency Ordered Stop   04/16/24 1800  meropenem  (MERREM )  1 g in sodium chloride  0.9 % 100 mL IVPB        1 g 200 mL/hr over 30 Minutes Intravenous Every 12 hours 04/16/24 1056     04/15/24 1400  meropenem  (MERREM ) 1 g in sodium chloride  0.9 % 100 mL IVPB  Status:  Discontinued        1 g 200 mL/hr over 30 Minutes Intravenous Every 8 hours 04/15/24 0915 04/16/24 1056   04/06/24  1300  metroNIDAZOLE  (FLAGYL ) IVPB 500 mg  Status:  Discontinued        500 mg 100 mL/hr over 60 Minutes Intravenous Every 12 hours 04/06/24 1217 04/15/24 0915   04/03/24 1330  metroNIDAZOLE  (FLAGYL ) IVPB 500 mg        500 mg 100 mL/hr over 60 Minutes Intravenous On call to O.R. 04/03/24 1242 04/03/24 1507   03/30/24 2200  vancomycin  (VANCOREADY) IVPB 750 mg/150 mL  Status:  Discontinued        750 mg 150 mL/hr over 60 Minutes Intravenous Every 24 hours 03/29/24 1620 04/03/24 1310   03/30/24 0400  ceFEPIme  (MAXIPIME ) 2 g in sodium chloride  0.9 % 100 mL IVPB  Status:  Discontinued        2 g 200 mL/hr over 30 Minutes Intravenous Every 12 hours 03/29/24 1620 04/15/24 0915   03/29/24 1530  vancomycin  (VANCOCIN ) IVPB 1000 mg/200 mL premix        1,000 mg 200 mL/hr over 60 Minutes Intravenous  Once 03/29/24 1517 03/30/24 0228   03/29/24 1530  ceFEPIme  (MAXIPIME ) 2 g in sodium chloride  0.9 % 100 mL IVPB        2 g 200 mL/hr over 30 Minutes Intravenous  Once 03/29/24 1518 03/29/24 1724        Objective: Vitals:   04/16/24 1933 04/16/24 2039 04/17/24 0311 04/17/24 0919  BP:  129/70 (!) 123/59   Pulse:  92 84   Resp:  14 14   Temp:  98.6 F (37 C) 99 F (37.2 C)   TempSrc:  Oral Oral   SpO2: 94% 93% 98% 95%  Weight:      Height:        Intake/Output Summary (Last 24 hours) at 04/17/2024 1030 Last data filed at 04/17/2024 0933 Gross per 24 hour  Intake 1907.67 ml  Output 1650 ml  Net 257.67 ml   Filed Weights   04/03/24 1327 04/07/24 0400 04/09/24 0524  Weight: 59.6 kg 58.8 kg 62.7 kg    Examination:  General exam: Appears comfortable, calm   Gastrointestinal system: Abdomen is nondistended Central nervous system: Alert  Extremities: Right forearm swollen, improved from yesterday   Data Reviewed: I have personally reviewed following labs and imaging studies  CBC: Recent Labs  Lab 04/13/24 0245 04/15/24 1508 04/16/24 1438 04/17/24 0337  WBC 17.4* 24.4* 21.0* 19.4*   NEUTROABS  --  21.5*  --   --   HGB 8.3* 8.6* 7.5* 7.3*  HCT 25.2* 26.4* 22.8* 22.3*  MCV 89.7 92.6 91.9 92.9  PLT 232 272 268 278   Basic Metabolic Panel: Recent Labs  Lab 04/11/24 0335 04/12/24 0237 04/13/24 0245 04/14/24 0401 04/16/24 0327  NA 135 134* 136 136 134*  K 3.9 4.2 3.9 4.2 4.3  CL 106 104 103 103 100  CO2 23 24 26 27 29   GLUCOSE 150* 220* 137* 133* 111*  BUN 17 16 17 20 23   CREATININE 0.49 0.52 0.55 0.52 0.56  CALCIUM  8.0* 8.0* 8.2* 8.1* 8.2*  MG 2.1  1.8 1.9 1.8 2.0  PHOS 2.2* 2.4* 2.5 2.5 2.5   GFR: Estimated Creatinine Clearance: 47.1 mL/min (by C-G formula based on SCr of 0.56 mg/dL). Liver Function Tests: Recent Labs  Lab 04/13/24 0245 04/16/24 0327  AST 15 17  ALT 6 7  ALKPHOS 51 51  BILITOT 0.3 <0.2  PROT 5.7* 5.5*  ALBUMIN  2.3* 2.2*   No results for input(s): LIPASE, AMYLASE in the last 168 hours. No results for input(s): AMMONIA in the last 168 hours. Coagulation Profile: No results for input(s): INR, PROTIME in the last 168 hours. Cardiac Enzymes: No results for input(s): CKTOTAL, CKMB, CKMBINDEX, TROPONINI in the last 168 hours. BNP (last 3 results) No results for input(s): PROBNP in the last 8760 hours. HbA1C: No results for input(s): HGBA1C in the last 72 hours. CBG: Recent Labs  Lab 04/16/24 1641 04/16/24 2042 04/17/24 0003 04/17/24 0403 04/17/24 0755  GLUCAP 119* 156* 101* 107* 130*   Lipid Profile: No results for input(s): CHOL, HDL, LDLCALC, TRIG, CHOLHDL, LDLDIRECT in the last 72 hours.  Thyroid  Function Tests: No results for input(s): TSH, T4TOTAL, FREET4, T3FREE, THYROIDAB in the last 72 hours. Anemia Panel: No results for input(s): VITAMINB12, FOLATE, FERRITIN, TIBC, IRON , RETICCTPCT in the last 72 hours. Sepsis Labs: No results for input(s): PROCALCITON, LATICACIDVEN in the last 168 hours.  Recent Results (from the past 240 hours)  Aerobic/Anaerobic  Culture w Gram Stain (surgical/deep wound)     Status: None   Collection Time: 04/10/24  1:52 PM   Specimen: Abscess  Result Value Ref Range Status   Specimen Description   Final    ABSCESS Performed at [facility], [address] [place], [place] [address]    Special Requests   Final     drain 2 Performed at [facility], [address], [place], [place] [address]    Gram Stain   Final    FEW WBC PRESENT, PREDOMINANTLY PMN NO ORGANISMS SEEN    Culture   Final    No growth aerobically or anaerobically. Performed at [facility], [address], [place], [place] [address]    Report Status 04/15/2024 FINAL  Final  Aerobic/Anaerobic Culture w Gram Stain (surgical/deep wound)     Status: None (Preliminary result)   Collection Time: 04/10/24  1:52 PM   Specimen: Abscess  Result Value Ref Range Status   Specimen Description   Final    ABSCESS Performed at [facility], [address], [place], [place] [address]    Special Requests   Final    DRAIN 1 Performed at [facility], [address] [place], [place] [address]    Gram Stain   Final    MODERATE WBC PRESENT, PREDOMINANTLY PMN NO ORGANISMS SEEN    Culture   Final    RARE ACHROMOBACTER SPECIES Sent to [facility] for further susceptibility testing. NO ANAEROBES ISOLATED Performed at [facility], [address], [place], [place] [address]    Report Status PENDING  Incomplete      Radiology Studies: US  EKG SITE RITE Result Date: 04/16/2024 If Site Rite image not attached, placement could not be confirmed due to current cardiac rhythm.  CT ABDOMEN PELVIS W CONTRAST Result Date: 04/15/2024 EXAM: CT ABDOMEN AND PELVIS WITH CONTRAST 04/15/2024 04:21:00 PM TECHNIQUE: CT of the abdomen and pelvis was performed with the administration of 100 mL of iohexol  (OMNIPAQUE ) 300 MG/ML solution. Multiplanar reformatted images are provided for  review. Automated exposure control, iterative reconstruction, and/or weight-based adjustment of the mA/kV was utilized to reduce the radiation dose to as low as reasonably achievable. COMPARISON: 04/10/2024 CLINICAL HISTORY: s/p drainage of LUQ/LLQ abdominal fluid collections 11/21 FINDINGS: LOWER CHEST: Small bilateral pleural effusions, stable since prior study. Compressive atelectasis in the lower lobes. Coronary artery calcifications. LIVER: The liver is unremarkable. GALLBLADDER AND BILE DUCTS: Gallbladder is unremarkable. No biliary ductal dilatation. SPLEEN: No acute abnormality. PANCREAS: No acute abnormality. ADRENAL GLANDS: No acute abnormality. KIDNEYS, URETERS AND BLADDER: No stones in the kidneys or ureters. No hydronephrosis. No perinephric or periureteral stranding. Foley catheter in the bladder, which is decompressed. GI AND BOWEL: Postoperative changes in left abdominal small bowel loops. Stomach demonstrates no acute abnormality. There is no bowel obstruction. PERITONEUM AND RETROPERITONEUM: Previously seen left fluid collections have been drained with drainage catheters in place. The larger left lower quadrant fluid collection has decreased significantly in size measuring maximally 1.4 cm compared to 3.8 x 3.2 cm previously. The smaller anterior left upper quadrant fluid collection is no longer visible. No free air. VASCULATURE: Aorta is normal in caliber. LYMPH NODES: No lymphadenopathy. REPRODUCTIVE ORGANS: No acute abnormality. BONES AND SOFT TISSUES: No acute osseous abnormality. No focal soft tissue abnormality. IMPRESSION: 1. Status post drainage of LUQ/LLQ abdominal fluid collections with drainage catheters in place, with marked interval decrease of the larger collection to 1.4 cm and resolution of the smaller collection. 2. Small bilateral pleural effusions, stable since prior study. Electronically signed by: Kevin Dover MD 04/15/2024 10:14 PM EST RP Workstation: [id number]       Scheduled Meds:  budesonide  (PULMICORT ) nebulizer solution  0.5 mg Nebulization BID   Chlorhexidine  Gluconate Cloth  6 each Topical Q0600   enoxaparin  (LOVENOX ) injection  60 mg Subcutaneous Q12H   feeding supplement (KATE FARMS STANDARD ENT 1.4)  325 mL Oral BID BM   fluticasone   1 spray Each Nare Daily   hydrocortisone  cream   Topical BID   insulin  aspart  0-15 Units Subcutaneous Q4H   ipratropium  2 spray Each Nare TID   levothyroxine   50 mcg Oral QAC breakfast   methocarbamol  (ROBAXIN ) injection  500 mg Intravenous Q8H   montelukast   10 mg Oral QHS   pantoprazole   40 mg Oral BID   polyethylene glycol  17 g Oral Daily   pravastatin   80 mg Oral Daily   sodium chloride  flush  10-40 mL Intracatheter Q12H   sodium chloride  flush  5 mL Intracatheter Q8H   tamsulosin   0.4 mg Oral Daily   Continuous Infusions:  meropenem  (MERREM ) IV 1 g (04/17/24 0533)   TPN ADULT (ION) 35 mL/hr at 04/16/24 1753     LOS: 19 days   Time spent: 35 minutes   Delon Hoe, DO [facility] 04/17/2024, 10:30 AM   Available via Epic secure chat 7am-7pm After these hours, please refer to coverage provider listed on [email or web address]

## 2024-04-17 NOTE — TOC Progression Note (Addendum)
 Transition of Care [facility]) - Progression Note    Patient Details  Name: Angelica [name] MRN: [id number] Date of Birth: [birth year]  [facility] CM/SW Contact  Alfonse JONELLE Rex, RN Phone Number: 04/17/2024, 10:23 AM  Clinical Narrative:   DONALD [phone] A   -3:20pm Per attending, patient should be weaned off TPN , may be ready for dc on Monday. SNF auth initiated, Auth ID [id number], auth pending.     Expected Discharge Plan: Skilled Nursing Facility Barriers to Discharge: Continued Medical Work up               Expected Discharge Plan and Services In-house Referral: Clinical Social Work   Post Acute Care Choice: Home Health Living arrangements for the past 2 months: Single Family Home                 DME Arranged: N/A DME Agency: NA                   Social Drivers of Health (SDOH) Interventions SDOH Screenings   Food Insecurity: No Food Insecurity (03/29/2024)  Housing: Low Risk  (03/29/2024)  Transportation Needs: No Transportation Needs (03/29/2024)  Utilities: Not At Risk (03/29/2024)  Alcohol Screen: Low Risk  (08/20/2023)  Depression (PHQ2-9): Low Risk  (12/17/2023)  Financial Resource Strain: Low Risk  (12/15/2023)  Physical Activity: Inactive (12/15/2023)  Social Connections: Moderately Integrated (12/15/2023)  Stress: No Stress Concern Present (12/15/2023)  Tobacco Use: Medium Risk (04/03/2024)  Health Literacy: Adequate Health Literacy (08/20/2023)    Readmission Risk Interventions    10/08/2022    2:08 PM  Readmission Risk Prevention Plan  Transportation Screening Complete  PCP or Specialist Appt within 5-7 Days Complete  Home Care Screening Complete  Medication Review (RN CM) Complete

## 2024-04-17 NOTE — Plan of Care (Signed)
   Problem: Fluid Volume: Goal: Ability to maintain a balanced intake and output will improve Outcome: Progressing   Problem: Skin Integrity: Goal: Risk for impaired skin integrity will decrease Outcome: Progressing   Problem: Tissue Perfusion: Goal: Adequacy of tissue perfusion will improve Outcome: Progressing

## 2024-04-17 NOTE — Progress Notes (Addendum)
 Speech Language Pathology Treatment: Dysphagia  Patient Details Name: Angelica [name] MRN: [id number] DOB: 07-24-1940 Today's Date: 04/17/2024 Time: 8461-8447 SLP Time Calculation (min) (ACUTE ONLY): 14 min  Assessment / Plan / Recommendation Clinical Impression  Pt has been advanced to soft diet and has been tolerating well. Dtr at bedside is pleased with pts improving appetite and tolerance of meals. SLP observed pt self feeding chicken, rice and green beans. No difficulty observed. Pt sipping from straws without coughing. Dtr reports surgeon is aware of straw use and pt is cleared to use.  Dtr always present for meals and chopping up food for pt. Does not want any further diet restrictions. Reviewed benefit of oral hygiene; they already have very good habits. No SLP f/u needed will sign off.   HPI HPI: Pt is a 83 y.o. female presented to ED with complaints of weakness; admitted 11/9 with large loculated left pleural effusion. 03/31/2024 diagnosed with SBO, s/p ex lap  11/14 ,placed NGT 04/01/24 s/p pigtail chest tube placed. CXR (11/16) revealed, Small left pleural effusion.  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      SLP Plan  Continue with current plan of care          Recommendations  Diet recommendations: Regular;Thin liquid Liquids provided via: Cup;Straw (Surgeon originally said no straw, but dtr says Dr Althia aware and straws ok) Supervision: Patient able to self feed;Trained caregiver to feed patient (caregiver cuts up food)                              Continue with current plan of care     Angelica [name], Angelica [name]  04/17/2024, 3:54 PM

## 2024-04-17 NOTE — Progress Notes (Signed)
 Physical Therapy Treatment Patient Details Name: Angelica [name] MRN: [id number] DOB: 1940-07-22 Today's Date: 04/17/2024   History of Present Illness Angelica [name] is a 83 y.o. female presented to ED with complaints of weakness; admitted 11/9 with large loculated left pleural effusion.03/31/2024 diagnosed with SBO, s/p ex lap  11/14,placed NGT 04/01/24 s/p pigtail chest tube placed. 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 R UE DVT 11/25.    PT Comments   Pt admitted with above diagnosis.  Pt currently with functional limitations due to the deficits listed below (see PT Problem List). Pt in bed when therapist arrived. Pt more alert and able to participate with therapy today. Pt required mod A for rolling side to side with specific cues for sequencing and use of hospital bed, pt required mod/max A x 2 for supine to sit, ed provided on abdominal precautions and log roll with HOB slightly elevated, mod A x 2 initially for SPT with HHA x 2 to recliner however with fatigue pt quickly faded into flexion and required max A x 2 to safely complete transfer tasks. Pt left in recliner, all needs in place, nurse aware of potential need to re-dress abdominal wound. Pt will benefit from acute skilled PT to increase their independence and safety with mobility to allow discharge.      If plan is discharge home, recommend the following: A lot of help with bathing/dressing/bathroom;Assistance with cooking/housework;Assist for transportation;Help with stairs or ramp for entrance;Two people to help with walking and/or transfers   Can travel by private vehicle     No  Equipment Recommendations  None recommended by PT    Recommendations for Other Services       Precautions / Restrictions Precautions Precautions: Fall Recall of Precautions/Restrictions: Impaired Precaution/Restrictions Comments: midline  incision Restrictions Weight Bearing Restrictions Per Provider Order: No     Mobility  Bed Mobility Overal bed mobility: Needs Assistance Bed Mobility: Supine to Sit, Rolling Rolling: Mod assist, Used rails   Supine to sit: +2 for physical assistance, +2 for safety/equipment, Max assist, Mod assist     General bed mobility comments: Pt was instructed on implementing the log roll technique to perform supine to sit with HOB slightly elevated mod/max A x 2 with pt initiating B LE to EOB with A for trunk    Transfers Overall transfer level: Needs assistance Equipment used: 2 person hand held assist Transfers: Bed to chair/wheelchair/BSC Sit to Stand: Mod assist, +2 physical assistance, Max assist           General transfer comment: cues and facilitation for weight shifting and LE advacement with noted B LE instability and pt quikly fading into flexion requiring max A x 2 to complete transfer task to recliner    Ambulation/Gait               General Gait Details: NT due to pt reported fatigue, indicated abdominal pain and required A for bed/transfer tasks   Stairs             Wheelchair Mobility     Tilt Bed    Modified Rankin (Stroke Patients Only)       Balance Overall balance assessment: Needs assistance Sitting-balance support: Feet supported Sitting balance-Leahy Scale: Fair     Standing balance support: During functional activity, Bilateral upper extremity supported Standing balance-Leahy Scale: Poor Standing balance comment: mod/max A x 2 with HHA  Communication Communication Communication: Impaired Factors Affecting Communication: Difficulty expressing self  Cognition Arousal: Alert Behavior During Therapy: WFL for tasks assessed/performed   PT - Cognitive impairments: History of cognitive impairments                         Following commands: Impaired Following commands impaired:  Follows one step commands with increased time    Cueing Cueing Techniques: Verbal cues, Gestural cues, Visual cues, Tactile cues  Exercises      General Comments        Pertinent Vitals/Pain Pain Assessment Pain Assessment: Faces Faces Pain Scale: Hurts little more Pain Location: abdomen Pain Descriptors / Indicators: Grimacing, Moaning, Guarding Pain Intervention(s): Limited activity within patient's tolerance, Monitored during session, Repositioned    Home Living                          Prior Function            PT Goals (current goals can now be found in the care plan section) Acute Rehab PT Goals Patient Stated Goal: agreeable to HHPT recommendation PT Goal Formulation: With patient/family Time For Goal Achievement: 05/01/24 Potential to Achieve Goals: Fair Progress towards PT goals: Progressing toward goals    Frequency    Min 2X/week      PT Plan      Co-evaluation              AM-PAC PT 6 Clicks Mobility   Outcome Measure  Help needed turning from your back to your side while in a flat bed without using bedrails?: A Lot Help needed moving from lying on your back to sitting on the side of a flat bed without using bedrails?: Total Help needed moving to and from a bed to a chair (including a wheelchair)?: Total Help needed standing up from a chair using your arms (e.g., wheelchair or bedside chair)?: Total Help needed to walk in hospital room?: Total Help needed climbing 3-5 steps with a railing? : Total 6 Click Score: 7    End of Session   Activity Tolerance: Patient limited by fatigue;Patient limited by pain Patient left: with call bell/phone within reach;in chair;with chair alarm set Nurse Communication: Mobility status;Other (comment) (abdominal needing to be re-dressed) PT Visit Diagnosis: Unsteadiness on feet (R26.81);Muscle weakness (generalized) (M62.81)     Time: 8786-8768 PT Time Calculation (min) (ACUTE ONLY): 18  min  Charges:      PT General Charges $$ ACUTE PT VISIT: 1 Visit                     [place], PT Acute Rehab    [place] VEAR Drone 04/17/2024, 2:23 PM

## 2024-04-17 NOTE — Progress Notes (Signed)
 14 Days Post-Op   Subjective/Chief Complaint: Family at bedside reports she did well with a soft diet yesterday Had more BM's Drain output from both showed 0 cc's last 24 hours   Objective: Vital signs in last 24 hours: Temp:  [98.6 F (37 C)-99 F (37.2 C)] 99 F (37.2 C) (11/28 0311) Pulse Rate:  [84-92] 84 (11/28 0311) Resp:  [14] 14 (11/28 0311) BP: (123-129)/(59-70) 123/59 (11/28 0311) SpO2:  [93 %-98 %] 98 % (11/28 0311) Last BM Date : 04/16/24  Intake/Output from previous day: 11/27 0701 - 11/28 0700 In: 1667.7 [P.O.:240; I.V.:1317.8; IV Piggyback:99.9] Out: 1400 [Urine:1400] Intake/Output this shift: No intake/output data recorded.  Exam: Awake and alert Comfortable Abdomen soft, non-distended Drains with some serous fluid  Lab Results:  Recent Labs    04/16/24 1438 04/17/24 0337  WBC 21.0* 19.4*  HGB 7.5* 7.3*  HCT 22.8* 22.3*  PLT 268 278   BMET Recent Labs    04/16/24 0327  NA 134*  K 4.3  CL 100  CO2 29  GLUCOSE 111*  BUN 23  CREATININE 0.56  CALCIUM  8.2*   PT/INR No results for input(s): LABPROT, INR in the last 72 hours. ABG No results for input(s): PHART, HCO3 in the last 72 hours.  Invalid input(s): PCO2, PO2  Studies/Results: US  EKG SITE RITE Result Date: 04/16/2024 If Site Rite image not attached, placement could not be confirmed due to current cardiac rhythm.  CT ABDOMEN PELVIS W CONTRAST Result Date: 04/15/2024 EXAM: CT ABDOMEN AND PELVIS WITH CONTRAST 04/15/2024 04:21:00 PM TECHNIQUE: CT of the abdomen and pelvis was performed with the administration of 100 mL of iohexol  (OMNIPAQUE ) 300 MG/ML solution. Multiplanar reformatted images are provided for review. Automated exposure control, iterative reconstruction, and/or weight-based adjustment of the mA/kV was utilized to reduce the radiation dose to as low as reasonably achievable. COMPARISON: 04/10/2024 CLINICAL HISTORY: s/p drainage of LUQ/LLQ abdominal fluid  collections 11/21 FINDINGS: LOWER CHEST: Small bilateral pleural effusions, stable since prior study. Compressive atelectasis in the lower lobes. Coronary artery calcifications. LIVER: The liver is unremarkable. GALLBLADDER AND BILE DUCTS: Gallbladder is unremarkable. No biliary ductal dilatation. SPLEEN: No acute abnormality. PANCREAS: No acute abnormality. ADRENAL GLANDS: No acute abnormality. KIDNEYS, URETERS AND BLADDER: No stones in the kidneys or ureters. No hydronephrosis. No perinephric or periureteral stranding. Foley catheter in the bladder, which is decompressed. GI AND BOWEL: Postoperative changes in left abdominal small bowel loops. Stomach demonstrates no acute abnormality. There is no bowel obstruction. PERITONEUM AND RETROPERITONEUM: Previously seen left fluid collections have been drained with drainage catheters in place. The larger left lower quadrant fluid collection has decreased significantly in size measuring maximally 1.4 cm compared to 3.8 x 3.2 cm previously. The smaller anterior left upper quadrant fluid collection is no longer visible. No free air. VASCULATURE: Aorta is normal in caliber. LYMPH NODES: No lymphadenopathy. REPRODUCTIVE ORGANS: No acute abnormality. BONES AND SOFT TISSUES: No acute osseous abnormality. No focal soft tissue abnormality. IMPRESSION: 1. Status post drainage of LUQ/LLQ abdominal fluid collections with drainage catheters in place, with marked interval decrease of the larger collection to 1.4 cm and resolution of the smaller collection. 2. Small bilateral pleural effusions, stable since prior study. Electronically signed by: Franky Crease MD 04/15/2024 10:14 PM EST RP Workstation: [id number]    Anti-infectives: Anti-infectives (From admission, onward)    Start     Dose/Rate Route Frequency Ordered Stop   04/16/24 1800  meropenem  (MERREM ) 1 g in sodium chloride  0.9 % 100 mL  IVPB        1 g 200 mL/hr over 30 Minutes Intravenous Every 12 hours 04/16/24 1056      04/15/24 1400  meropenem  (MERREM ) 1 g in sodium chloride  0.9 % 100 mL IVPB  Status:  Discontinued        1 g 200 mL/hr over 30 Minutes Intravenous Every 8 hours 04/15/24 0915 04/16/24 1056   04/06/24 1300  metroNIDAZOLE  (FLAGYL ) IVPB 500 mg  Status:  Discontinued        500 mg 100 mL/hr over 60 Minutes Intravenous Every 12 hours 04/06/24 1217 04/15/24 0915   04/03/24 1330  metroNIDAZOLE  (FLAGYL ) IVPB 500 mg        500 mg 100 mL/hr over 60 Minutes Intravenous On call to O.R. 04/03/24 1242 04/03/24 1507   03/30/24 2200  vancomycin  (VANCOREADY) IVPB 750 mg/150 mL  Status:  Discontinued        750 mg 150 mL/hr over 60 Minutes Intravenous Every 24 hours 03/29/24 1620 04/03/24 1310   03/30/24 0400  ceFEPIme  (MAXIPIME ) 2 g in sodium chloride  0.9 % 100 mL IVPB  Status:  Discontinued        2 g 200 mL/hr over 30 Minutes Intravenous Every 12 hours 03/29/24 1620 04/15/24 0915   03/29/24 1530  vancomycin  (VANCOCIN ) IVPB 1000 mg/200 mL premix        1,000 mg 200 mL/hr over 60 Minutes Intravenous  Once 03/29/24 1517 03/30/24 0228   03/29/24 1530  ceFEPIme  (MAXIPIME ) 2 g in sodium chloride  0.9 % 100 mL IVPB        2 g 200 mL/hr over 30 Minutes Intravenous  Once 03/29/24 1518 03/29/24 1724       Assessment/Plan: POD 14, s/p ex lap with SBR, Dr. Teresa 11/14   -on full does lovenox  for RUE DVT -tolerating a soft diet -drains now with minimal output.  From a surgical standpoint, I think the drains can be removed by IR. -discussed care with family in the room  Vicenta Poli 04/17/2024

## 2024-04-17 NOTE — Progress Notes (Signed)
 Secure chat with Delon Hoe DO about Current PICC line. Best practice  is to leave current PICC in place and treat the clot. The clot is being treated and MD is ok with leaving the current PICC in place.

## 2024-04-17 NOTE — Progress Notes (Signed)
 Referring Provider(s): Tanda Locus  Supervising Physician: Johann Sieving  Patient Status: [facility] - In-pt  Chief Complaint: post operative abdominal fluid collections s/p LUQ and LLQ fluid collection drains placed on 04/10/2024 by Dr. Jenna  Subjective: Patient presents for abdominal drain injections of the LUQ and LLQ drains.  Allergies Reviewed:  Asa buff (mag [buffered aspirin], Ensure [nutritional supplements], Fish allergy, Other, Peanut-containing drug products, Penicillins, Shellfish protein-containing drug products, and Strawberry extract   Past Medical History:  Diagnosis Date   Allergy 05/21/1978   Arthritis    Asthma    Bowel obstruction (HCC)    Cancer (HCC)    Cataract    Environmental allergies    GERD (gastroesophageal reflux disease)    Glaucoma 05/09/2017   H. pylori infection    History of radiation therapy 07/05/20-08/05/20   Left lung IMRT Dr. Shannon    HLD (hyperlipidemia) 01/16/2019   Hypertension    Iron  deficiency anemia    Osteopenia 05/26/2017   Thyroid  disease    Urticaria 07/25/2018    Past Surgical History:  Procedure Laterality Date   ABDOMINAL HYSTERECTOMY     BOWEL RESECTION N/A 05/23/2020   Procedure: SMALL BOWEL REPAIR;  Surgeon: Stevie, Herlene Righter, MD;  Location: [facility] OR;  Service: General;  Laterality: N/A;   BREAST BIOPSY     BRONCHIAL BIOPSY  06/10/2020   Procedure: BRONCHIAL BIOPSIES;  Surgeon: Brenna Adine CROME, DO;  Location: [facility];  Service: Pulmonary;;   BRONCHIAL BRUSHINGS  06/10/2020   Procedure: BRONCHIAL BRUSHINGS;  Surgeon: Brenna Adine CROME, DO;  Location: [facility];  Service: Pulmonary;;   BRONCHIAL NEEDLE ASPIRATION BIOPSY  06/10/2020   Procedure: BRONCHIAL NEEDLE ASPIRATION BIOPSIES;  Surgeon: Brenna Adine CROME, DO;  Location: [facility];  Service: Pulmonary;;   BRONCHIAL WASHINGS  06/10/2020   Procedure: BRONCHIAL WASHINGS;  Surgeon: Brenna Adine CROME, DO;  Location: [facility];  Service:  Pulmonary;;   CHEST TUBE INSERTION Left 04/01/2024   Procedure: CHEST TUBE INSERTION;  Surgeon: Catherine Cools, MD;  Location: [facility];  Service: Pulmonary;  Laterality: Left;   COLON SURGERY     DIAGNOSTIC LARYNGOSCOPY N/A 05/23/2020   Procedure: ATTEMPTED DIAGNOSTIC LAPAROSCOPY WITH ADHESIONS;  Surgeon: Kinsinger, Herlene Righter, MD;  Location: [facility];  Service: General;  Laterality: N/A;   EYE SURGERY     IR IMAGING GUIDED PORT INSERTION  07/15/2020   IR REMOVAL TUN ACCESS W/ PORT W/O FL MOD SED  01/16/2023   KNEE SURGERY     LAPAROTOMY N/A 04/18/2015   Procedure: Exploratory laparotomy with lysis of adhesions, possible bowel resection;  Surgeon: Lynda Leos, MD;  [facility]: [facility];  Service: General;  Laterality: N/A;   LAPAROTOMY N/A 05/23/2020   Procedure: EXPLORATORY LAPAROTOMY;  Surgeon: Stevie Herlene Righter, MD;  Location: [facility];  Service: General;  Laterality: N/A;   LAPAROTOMY N/A 04/03/2024   Procedure: EXPLORATORY LAPAROTOMY, LYSIS OF ADHESIONS, SMALL BOWEL RESECTION, SMALL BOWEL REPAIR;  Surgeon: Teresa Lonni HERO, MD;  Location: [facility]S;  Service: General;  Laterality: N/A;   LYSIS OF ADHESION N/A 05/23/2020   Procedure: LYSIS OF ADHESION;  Surgeon: Stevie Herlene Righter, MD;  [facility];  Service: General;  Laterality: N/A;   NASAL SINUS SURGERY     SMALL INTESTINE SURGERY     THORACENTESIS Left 12/03/2022   Procedure: THORACENTESIS;  Surgeon: Brenna Adine CROME, DO;  [facility];  Service: Pulmonary;  Laterality: Left;   THYROID  SURGERY     VIDEO BRONCHOSCOPY WITH  ENDOBRONCHIAL NAVIGATION Bilateral 06/10/2020   Procedure: VIDEO BRONCHOSCOPY WITH ENDOBRONCHIAL NAVIGATION;  Surgeon: Brenna Adine CROME, DO;  [facility];  Service: Pulmonary;  Laterality: Bilateral;   VIDEO BRONCHOSCOPY WITH ENDOBRONCHIAL ULTRASOUND  06/10/2020   Procedure: VIDEO BRONCHOSCOPY WITH ENDOBRONCHIAL ULTRASOUND;  Surgeon: Brenna Adine CROME, DO;  [facility]: [facility];   Service: Pulmonary;;      Medications: Prior to Admission medications   Medication Sig Start Date End Date Taking? Authorizing Provider  acetaminophen  (TYLENOL ) 500 MG tablet Take 1,000 mg by mouth every 6 (six) hours as needed for moderate pain.   Yes [provider]  albuterol  (VENTOLIN  HFA) 108 (90 Base) MCG/ACT inhaler Inhale 2 puffs into the lungs every 4 (four) hours as needed for wheezing or shortness of breath. 01/03/24  Yes Padgett, Danita Macintosh, MD  alum & mag hydroxide-simeth (MAALOX/MYLANTA) 200-200-20 MG/5ML suspension Take 15 mLs by mouth every 6 (six) hours as needed for indigestion or heartburn.   Yes [provider]  amLODipine  (NORVASC ) 10 MG tablet Take 1 tablet (10 mg total) by mouth daily. 06/17/23  Yes Norleen Lynwood ORN, MD  benralizumab  (FASENRA ) 30 MG/ML prefilled syringe Inject 1 mL (30 mg total) into the skin every 8 (eight) weeks. 07/15/23  Yes Padgett, Danita Macintosh, MD  cetirizine  [facility] ALLERGY RELIEF, CETIRIZINE ,) 10 MG tablet Take 1 tablet (10 mg total) by mouth daily. 01/03/24  Yes Jeneal Danita Macintosh, MD  clopidogrel  (PLAVIX ) 75 MG tablet Take 1 tablet by mouth once daily 07/18/23  Yes Norleen Lynwood ORN, MD  diclofenac  Sodium (VOLTAREN ) 1 % GEL Apply 4 g topically 4 (four) times daily. Patient taking differently: Apply 4 g topically 4 (four) times daily as needed (pain). 04/23/22  Yes Corey, Evan S, MD  EPINEPHrine  (EPIPEN  2-PAK) 0.3 mg/0.3 mL IJ SOAJ injection Use as directed for severe allergic reaction 01/03/24  Yes Padgett, Danita Macintosh, MD  fluticasone  (FLONASE ) 50 MCG/ACT nasal spray USE 1 SPRAY(S) IN EACH NOSTRIL IN THE MORNING AND AT BEDTIME Patient taking differently: Place 1 spray into both nostrils daily. 01/14/24  Yes Jeneal Danita Macintosh, MD  ipratropium (ATROVENT ) 0.06 % nasal spray Place 2 sprays into both nostrils 3 (three) times daily as needed for rhinitis. 01/03/24  Yes Jeneal Danita Macintosh, MD  levothyroxine   (SYNTHROID ) 50 MCG tablet TAKE 1 TABLET BY MOUTH ONCE DAILY BEFORE BREAKFAST 03/10/24  Yes Norleen Lynwood ORN, MD  memantine  (NAMENDA ) 10 MG tablet Take 1 tablet (10 mg total) by mouth 2 (two) times daily. 10/30/23  Yes McCue, Harlene, NP  montelukast  (SINGULAIR ) 10 MG tablet Take 1 tablet (10 mg total) by mouth at bedtime. 01/03/24  Yes Padgett, Danita Macintosh, MD  ondansetron  (ZOFRAN -ODT) 4 MG disintegrating tablet Take 1 tablet (4 mg total) by mouth every 8 (eight) hours as needed for nausea or vomiting. 07/09/22  Yes Merlynn Eland F, FNP  SYMBICORT  80-4.5 MCG/ACT inhaler INHALE 2 PUFFS BY MOUTH IN THE MORNING 01/03/24  Yes Padgett, Danita Macintosh, MD  gentamicin  ointment (GARAMYCIN ) 0.1 % Apply 1 Application topically daily. Apply to wound daily between toes.  Use a very small amount. Patient not taking: Reported on 03/10/2024 08/27/23   McCaughan, Dia D, DPM  Magnesium  Hydroxide (PHILLIPS MILK OF MAGNESIA PO) Take 15-30 mLs by mouth daily as needed (for constipation).    [provider]  pantoprazole  (PROTONIX ) 40 MG tablet Take 1 tablet by mouth once daily 03/31/24   Norleen Lynwood ORN, MD  potassium chloride  SA (KLOR-CON  M) 20 MEQ tablet Take  2 tablets (40 mEq total) by mouth daily as needed. Take potasium days you take lasix . Patient not taking: Reported on 03/10/2024 01/03/23   Norleen Lynwood ORN, MD  pravastatin  (PRAVACHOL ) 80 MG tablet Take 1 tablet by mouth once daily 04/09/24   Norleen Lynwood ORN, MD     Family History  Problem Relation Age of Onset   Diabetes Mother    Hypertension Mother    Hypertension Father    Glaucoma Sister    Glaucoma Brother    Allergic rhinitis Neg Hx    Angioedema Neg Hx    Asthma Neg Hx    Eczema Neg Hx    Immunodeficiency Neg Hx    Urticaria Neg Hx    BRCA 1/2 Neg Hx    Breast cancer Neg Hx     Social History   Socioeconomic History   Marital status: Widowed    Spouse name: Not on file   Number of children: 4   Years of education: 74   Highest  education level: 10th grade  Occupational History   Occupation: retired  Tobacco Use   Smoking status: Former    Current packs/day: 0.00    Types: Cigarettes    Quit date: 05/21/1970    Years since quitting: 53.9    Passive exposure: Past   Smokeless tobacco: Never   Tobacco comments:    quit 60 years ago  Vaping Use   Vaping status: Never Used  Substance and Sexual Activity   Alcohol use: No   Drug use: No   Sexual activity: Never  Other Topics Concern   Not on file  Social History Narrative   Right handed   Caffeine on occ   One story home   Social Drivers of Health   Financial Resource Strain: Low Risk  (12/15/2023)   Overall Financial Resource Strain (CARDIA)    Difficulty of Paying Living Expenses: Not hard at all  Food Insecurity: No Food Insecurity (03/29/2024)   Hunger Vital Sign    Worried About Running Out of Food in the Last Year: Never true    Ran Out of Food in the Last Year: Never true  Transportation Needs: No Transportation Needs (03/29/2024)   PRAPARE - Administrator, Civil Service (Medical): No    Lack of Transportation (Non-Medical): No  Physical Activity: Inactive (12/15/2023)   Exercise Vital Sign    Days of Exercise per Week: 0 days    Minutes of Exercise per Session: Not on file  Stress: No Stress Concern Present (12/15/2023)   [employer] of Occupational Health - Occupational Stress Questionnaire    Feeling of Stress: Not at all  Social Connections: Moderately Integrated (12/15/2023)   Social Connection and Isolation Panel    Frequency of Communication with Friends and Family: More than three times a week    Frequency of Social Gatherings with Friends and Family: More than three times a week    Attends Religious Services: More than 4 times per year    Active Member of [employer] or Organizations: Yes    Attends Banker Meetings: More than 4 times per year    Marital Status: Widowed     Vital Signs: BP (!) 123/59 (BP  Location: Left Arm)   Pulse 84   Temp 99 F (37.2 C) (Oral)   Resp 14   Ht 5' 5 (1.651 m)   Wt 138 lb 3.7 oz (62.7 kg)   SpO2 95%   BMI 23.00 kg/m  Physical Exam Cardiovascular:     Rate and Rhythm: Normal rate.  Pulmonary:     Effort: Pulmonary effort is normal. No respiratory distress.  Abdominal:     Palpations: Abdomen is soft.  Skin:    General: Skin is warm.  Neurological:     Mental Status: She is alert. She is disoriented.     Imaging: US  EKG SITE RITE Result Date: 04/16/2024 If Site Rite image not attached, placement could not be confirmed due to current cardiac rhythm.  CT ABDOMEN PELVIS W CONTRAST Result Date: 04/15/2024 EXAM: CT ABDOMEN AND PELVIS WITH CONTRAST 04/15/2024 04:21:00 PM TECHNIQUE: CT of the abdomen and pelvis was performed with the administration of 100 mL of iohexol  (OMNIPAQUE ) 300 MG/ML solution. Multiplanar reformatted images are provided for review. Automated exposure control, iterative reconstruction, and/or weight-based adjustment of the mA/kV was utilized to reduce the radiation dose to as low as reasonably achievable. COMPARISON: 04/10/2024 CLINICAL HISTORY: s/p drainage of LUQ/LLQ abdominal fluid collections 11/21 FINDINGS: LOWER CHEST: Small bilateral pleural effusions, stable since prior study. Compressive atelectasis in the lower lobes. Coronary artery calcifications. LIVER: The liver is unremarkable. GALLBLADDER AND BILE DUCTS: Gallbladder is unremarkable. No biliary ductal dilatation. SPLEEN: No acute abnormality. PANCREAS: No acute abnormality. ADRENAL GLANDS: No acute abnormality. KIDNEYS, URETERS AND BLADDER: No stones in the kidneys or ureters. No hydronephrosis. No perinephric or periureteral stranding. Foley catheter in the bladder, which is decompressed. GI AND BOWEL: Postoperative changes in left abdominal small bowel loops. Stomach demonstrates no acute abnormality. There is no bowel obstruction. PERITONEUM AND RETROPERITONEUM:  Previously seen left fluid collections have been drained with drainage catheters in place. The larger left lower quadrant fluid collection has decreased significantly in size measuring maximally 1.4 cm compared to 3.8 x 3.2 cm previously. The smaller anterior left upper quadrant fluid collection is no longer visible. No free air. VASCULATURE: Aorta is normal in caliber. LYMPH NODES: No lymphadenopathy. REPRODUCTIVE ORGANS: No acute abnormality. BONES AND SOFT TISSUES: No acute osseous abnormality. No focal soft tissue abnormality. IMPRESSION: 1. Status post drainage of LUQ/LLQ abdominal fluid collections with drainage catheters in place, with marked interval decrease of the larger collection to 1.4 cm and resolution of the smaller collection. 2. Small bilateral pleural effusions, stable since prior study. Electronically signed by: Franky Crease MD 04/15/2024 10:14 PM EST RP Workstation: [id number]   VAS US  UPPER EXTREMITY VENOUS DUPLEX Result Date: 04/15/2024 UPPER VENOUS STUDY  Patient Name:  Angelica [name]  Date of Exam:   04/14/2024 Medical Rec #: [id number]         Accession #:    [id number] Date of Birth: 1941/02/24          Patient Gender: F Patient Age:   36 years Exam Location:  [facility] Procedure:      VAS US  UPPER EXTREMITY VENOUS DUPLEX Referring Phys: DELON YATES --------------------------------------------------------------------------------  Indications: Edema, and lung cancer. Comparison Study: No prior exam of the right upper extremity on file. Performing Technologist: Edilia Elden Appl  Examination Guidelines: A complete evaluation includes B-mode imaging, spectral Doppler, color Doppler, and power Doppler as needed of all accessible portions of each vessel. Bilateral testing is considered an integral part of a complete examination. Limited examinations for reoccurring indications may be performed as noted.  Right Findings:  +----------+------------+---------+-----------+----------+---------------+ RIGHT     CompressiblePhasicitySpontaneousProperties    Summary     +----------+------------+---------+-----------+----------+---------------+ IJV  Yes       Yes                Resistance.   +----------+------------+---------+-----------+----------+---------------+ Subclavian    Full       Yes       No                   IV line     +----------+------------+---------+-----------+----------+---------------+ Axillary      None       No        No               Distal segment  +----------+------------+---------+-----------+----------+---------------+ Brachial      Full       Yes       Yes                              +----------+------------+---------+-----------+----------+---------------+ Radial        Full                                                  +----------+------------+---------+-----------+----------+---------------+ Ulnar         Full                                                  +----------+------------+---------+-----------+----------+---------------+ Cephalic      None       Yes       Yes              At antecubital. +----------+------------+---------+-----------+----------+---------------+ Basilic       Full       Yes       Yes                              +----------+------------+---------+-----------+----------+---------------+ Deep vein thrombosis noted in the distal portion of the axillary vein, all other segments are compressible. Focal superficial vein thrombosis noted in the cephalic vein at the antecubital fossa, all other segments are compressible.  Left Findings: +----------+------------+---------+-----------+----------+-----------+ LEFT      CompressiblePhasicitySpontaneousProperties  Summary   +----------+------------+---------+-----------+----------+-----------+ IJV                      Yes       Yes               Resistance. +----------+------------+---------+-----------+----------+-----------+ Subclavian    Full       Yes       Yes                          +----------+------------+---------+-----------+----------+-----------+  Summary:  Right: Findings consistent with acute deep vein thrombosis involving the right axillary vein. Findings consistent with acute superficial vein thrombosis involving the right cephalic vein.  Left: No evidence of thrombosis in the subclavian.  *See table(s) above for measurements and observations.  Diagnosing physician: Lonni Gaskins MD Electronically signed by Lonni Gaskins MD on 04/15/2024 at 7:49:17 AM.    Final    CT GUIDED PERITONEAL/RETROPERITONEAL FLUID DRAIN BY [facility] CATH Result Date: 04/10/2024 INDICATION: Postoperative abdominal abscesses. EXAM: CT-guided drain placement  TECHNIQUE: Multidetector CT imaging of the abdomen was performed following the standard protocol without IV contrast. RADIATION DOSE REDUCTION: This exam was performed according to the departmental dose-optimization program which includes automated exposure control, adjustment of the mA and/or kV according to patient size and/or use of iterative reconstruction technique. MEDICATIONS: The patient is currently admitted to the hospital and receiving intravenous antibiotics. The antibiotics were administered within an appropriate time frame prior to the initiation of the procedure. ANESTHESIA/SEDATION: Moderate (conscious) sedation was employed during this procedure. A total of Versed  1 mg and Fentanyl  50 mcg was administered intravenously by the radiology nurse. Total intra-service moderate Sedation Time: 1. minutes. The patient's level of consciousness and vital signs were monitored continuously by radiology nursing throughout the procedure under my direct supervision. COMPLICATIONS: None immediate. PROCEDURE: Informed written consent was obtained from the patient's daughter/POA after a thorough  discussion of the procedural risks, benefits and alternatives. All questions were addressed. Maximal Sterile Barrier Technique was utilized including caps, mask, sterile gowns, sterile gloves, sterile drape, hand hygiene and skin antiseptic. A timeout was performed prior to the initiation of the procedure. Initial axial images of the abdomen redemonstrated 2 fluid collections highly suspicious for abscesses in this postoperative patient. Left upper quadrant and more midline left lower quadrant. Radiopaque markers placed on the patient's skin and both regions were marked, prepped, and draped in the usual sterile fashion. Local anesthesia was achieved with 1% lidocaine . A 7 cm Yueh needle was advanced through a small incision in the left upper quadrant with repeat CT imaging until the needle tip was identified within the fluid collection. The needle was then withdrawn leaving the sheath in position. A short Amplatz wire was then advanced into the fluid collection. The access site was dilated with a 10 French dilator and then a 10 French pigtail catheter was coiled within the fluid collection. A small sample was obtained and capped for further laboratory evaluation. The catheter was then connected to a JP bulb. Retention suture and sterile dressing applied. A 7 cm Yueh needle was advanced through a small incision in the left lower quadrant with repeat CT imaging until the needle tip was identified within the fluid collection. The needle was then withdrawn leaving the sheath in position. A short Amplatz wire was then advanced into the fluid collection. The access site was dilated with a 10 French dilator and then a 10 French pigtail catheter was coiled within the fluid collection. A small sample was obtained and capped for further laboratory evaluation. The catheter was then connected to a JP bulb. Retention suture and sterile dressing applied. IMPRESSION: Satisfactory placement of left upper quadrant and left lower  quadrant abscess drains. The fluid collections appear to be infected hematomas. Both catheter is connected to JP bulbs with instructions for flushing placed in the patient's chart. Sample sent to laboratory for culture and sensitivity. Electronically Signed   By: Cordella Banner   On: 04/10/2024 16:51   CT GUIDED PERITONEAL/RETROPERITONEAL FLUID DRAIN BY [facility] CATH Result Date: 04/10/2024 INDICATION: Postoperative abdominal abscesses. EXAM: CT-guided drain placement TECHNIQUE: Multidetector CT imaging of the abdomen was performed following the standard protocol without IV contrast. RADIATION DOSE REDUCTION: This exam was performed according to the departmental dose-optimization program which includes automated exposure control, adjustment of the mA and/or kV according to patient size and/or use of iterative reconstruction technique. MEDICATIONS: The patient is currently admitted to the hospital and receiving intravenous antibiotics. The antibiotics were administered within an appropriate time frame prior  to the initiation of the procedure. ANESTHESIA/SEDATION: Moderate (conscious) sedation was employed during this procedure. A total of Versed  1 mg and Fentanyl  50 mcg was administered intravenously by the radiology nurse. Total intra-service moderate Sedation Time: 1. minutes. The patient's level of consciousness and vital signs were monitored continuously by radiology nursing throughout the procedure under my direct supervision. COMPLICATIONS: None immediate. PROCEDURE: Informed written consent was obtained from the patient's daughter/POA after a thorough discussion of the procedural risks, benefits and alternatives. All questions were addressed. Maximal Sterile Barrier Technique was utilized including caps, mask, sterile gowns, sterile gloves, sterile drape, hand hygiene and skin antiseptic. A timeout was performed prior to the initiation of the procedure. Initial axial images of the abdomen redemonstrated 2  fluid collections highly suspicious for abscesses in this postoperative patient. Left upper quadrant and more midline left lower quadrant. Radiopaque markers placed on the patient's skin and both regions were marked, prepped, and draped in the usual sterile fashion. Local anesthesia was achieved with 1% lidocaine . A 7 cm Yueh needle was advanced through a small incision in the left upper quadrant with repeat CT imaging until the needle tip was identified within the fluid collection. The needle was then withdrawn leaving the sheath in position. A short Amplatz wire was then advanced into the fluid collection. The access site was dilated with a 10 French dilator and then a 10 French pigtail catheter was coiled within the fluid collection. A small sample was obtained and capped for further laboratory evaluation. The catheter was then connected to a JP bulb. Retention suture and sterile dressing applied. A 7 cm Yueh needle was advanced through a small incision in the left lower quadrant with repeat CT imaging until the needle tip was identified within the fluid collection. The needle was then withdrawn leaving the sheath in position. A short Amplatz wire was then advanced into the fluid collection. The access site was dilated with a 10 French dilator and then a 10 French pigtail catheter was coiled within the fluid collection. A small sample was obtained and capped for further laboratory evaluation. The catheter was then connected to a JP bulb. Retention suture and sterile dressing applied. IMPRESSION: Satisfactory placement of left upper quadrant and left lower quadrant abscess drains. The fluid collections appear to be infected hematomas. Both catheter is connected to JP bulbs with instructions for flushing placed in the patient's chart. Sample sent to laboratory for culture and sensitivity. Electronically Signed   By: Cordella Banner   On: 04/10/2024 16:51   CT CHEST ABDOMEN PELVIS W CONTRAST Result Date:  04/10/2024 EXAM: CT CHEST ABDOMEN PELVIS WITH THORACIC AND LUMBAR SPINE RECONSTRUCTIONS 04/10/2024 12:44:37 AM TECHNIQUE: CT of the chest, abdomen, pelvis was performed after the administration of 100 mL of iohexol  (OMNIPAQUE ) 300 MG/ML solution. Multiplanar reformatted images are provided for review, including reconstructed images of the thoracic and lumbar spine. Automated exposure control, iterative reconstruction, and/or weight based adjustment of the mA/kV was utilized to reduce the radiation dose to as low as reasonably achievable. COMPARISON: 03/31/2024 and 03/29/2024 CLINICAL HISTORY: Leukocystosis, stage III lung cancer, recent pleural effusion with chest tube, recent ex lap with SBR for SBO. *tracking code: Bo* FINDINGS: CT CHEST: THORACIC AORTA: Mild atherosclerotic calcification within the thoracic aorta. No aortic aneurysm. No acute traumatic injury of the aorta. MEDIASTINUM: Extensive multivessel coronary artery calcification. Global cardiac size within normal limits. No pericardial effusion. Right upper extremity PICC line tip seen within the superior vena cava. 3.8 cm left thyroid   nodule, not well characterized on this examination. Nasogastric tube tip seen within the distal body of the stomach. Esophagus unremarkable. No pathologic thoracic adenopathy. No mediastinal hematoma or pneumomediastinum. No acute traumatic injury to the heart or pericardium. The central airways are clear. LUNGS: Small dependent right pleural effusion has increased since prior examination. Loculated left pleural effusion noted on prior examination has been largely evacuated though a residual posterior collection is seen measuring 2.1 x 6.1 x 5.0 cm (volume 33.4 cm3). Small adjacent free flowing pleural effusion is present. Subtotal collapse of the left lower lobe. Band-like atelectasis within the left upper lobe. No superimposed confluent pulmonary infiltrate. Compressive atelectasis of the right lower lobe. No  pneumothorax. No central obstructing lesion. No acute traumatic injury to the lungs. No pulmonary contusion or laceration. CHEST WALL: No acute displaced rib fracture. No chest wall hematoma. CT ABDOMEN AND PELVIS: ABDOMINAL AORTA: Mild aortoiliac atherosclerotic calcification. No acute traumatic injury of the aorta or iliac arteries. HEPATOBILIARY: No acute traumatic injury. SPLEEN: No acute traumatic injury. PANCREAS: No acute traumatic injury. ADRENAL GLANDS: No acute traumatic injury. KIDNEYS: No acute traumatic injury. No hydronephrosis. GI TRACT: Interval partial small bowel resection with resolution of the previously noted small bowel obstruction. Oral contrast now extends into the distal transverse colon. Adjacent to the anastomosis is a loculated, rim enhancing intraperitoneal fluid collection measuring 3.1 x 3.7 x 8.3 cm (volume 49.7 cm3) concerning for a postoperative abscess. Second similar loculated, rim enhancing peritoneal fluid collection seen anteriorly within the left lower quadrant measuring 1.8 x 3.8 x 10.5 cm (volume 37.3 cm3). PERITONEUM: Loculated, rim enhancing intraperitoneal fluid collections as described in the GI tract section. No free intraperitoneal gas. RETROPERITONEUM: No retroperitoneal hematoma. BLADDER: Foley catheter balloon seen within the incompletely decompressed bladder lumen. REPRODUCTIVE ORGANS: Uterus absent. No adnexal masses. BONES: Osseous structures are age appropriate. No acute bone abnormality. No lytic or blastic bone lesion. No acute traumatic fracture of the pelvis. THORACIC AND LUMBAR SPINE: BONES AND ALIGNMENT: No traumatic fracture or traumatic malalignment. DEGENERATIVE CHANGES: No severe spinal canal stenosis or bony neural foraminal narrowing. SOFT TISSUES: No paraspinal mass or hematoma. IMPRESSION: 1. Interval partial small bowel resection with resolution of the previously noted small bowel obstruction. Oral contrast now extends into the distal transverse  colon. 2. Loculated, rim enhancing intraperitoneal fluid collection adjacent to the anastomosis measuring 3.1 x 3.7 x 8.3 cm (approximate volume 49 mL), concerning for a postoperative abscess. 3. Second similar loculated, rim enhancing peritoneal fluid collection in the left lower quadrant measuring 1.8 x 3.8 x 10.5 cm (approximate volume 37 mL). 4. Small dependent right pleural effusion, increased since prior examination. 5. Residual posterior loculated left pleural effusion measuring 2.1 x 6.1 x 5.0 cm (approximate volume 33 mL), with small adjacent free flowing pleural effusion. Electronically signed by: Dorethia Molt MD 04/10/2024 04:44 AM EST RP Workstation: [id number]   US  EKG SITE RITE Result Date: 04/07/2024 If Site Rite image not attached, placement could not be confirmed due to current cardiac rhythm.  DG CHEST PORT 1 VIEW Result Date: 04/05/2024 EXAM: 1 VIEW(S) XRAY OF THE CHEST 04/05/2024 02:37:00 AM COMPARISON: None available. CLINICAL HISTORY: Dyspnea. FINDINGS: LINES, TUBES AND DEVICES: Left basilar pigtail chest tube has been removed. Nasogastric tube extends into the upper abdomen beyond the margin of the examination. LUNGS AND PLEURA: Small left pleural effusion is present. Ovoid density within the left mid lung zone likely represents fluid within the fissure. Right lung is clear. No pneumothorax. HEART AND  MEDIASTINUM: No acute abnormality of the cardiac and mediastinal silhouettes. BONES AND SOFT TISSUES: No acute osseous abnormality. IMPRESSION: 1. Small left pleural effusion. 2. Status post left basilar chest tube removal. No pneumothorax. Electronically signed by: Dorethia Molt MD 04/05/2024 02:56 AM EST RP Workstation: [id number]   DG CHEST PORT 1 VIEW Result Date: 04/04/2024 CLINICAL DATA:  Pleural effusion. EXAM: PORTABLE CHEST 1 VIEW COMPARISON:  04/03/2024 FINDINGS: The cardiopericardial silhouette is within normal limits for size. The NG tube passes into the stomach although  the distal tip position is not included on the film. Left chest tube evident with left base atelectasis/infiltrate and small left pleural effusion. No definite left-sided pneumothorax. Right lung clear. Lucency under the right hemidiaphragm is compatible with intraperitoneal free air not unexpected given the history of exploratory laparotomy yesterday. IMPRESSION: 1. Left base atelectasis/infiltrate with small left pleural effusion. No definite left-sided pneumothorax. 2. Intraperitoneal free air under the right hemidiaphragm, not unexpected given the history of exploratory laparotomy yesterday. Electronically Signed   By: Camellia Candle M.D.   On: 04/04/2024 08:41   DG Abd 1 View Result Date: 04/03/2024 EXAM: 1 VIEW XRAY OF THE ABDOMEN 04/03/2024 07:50:00 AM COMPARISON: 04/02/2024 CLINICAL HISTORY: SBO (small bowel obstruction) (HCC) FINDINGS: LINES, TUBES AND DEVICES: Enteric tube is similarly positioned with the tip in the gastric antrum. Partially visualized pigtail drainage catheter in lower left chest. BOWEL: Severe dilation of multiple segments of small bowel in the upper abdomen measuring up to 7.9 cm in diameter. There is enteric contrast present, but most of this contrast appears to be within the small bowel and has not reached the colon since yesterday. SOFT TISSUES: No opaque urinary calculi. BONES: No acute osseous abnormality. IMPRESSION: 1. Persistent severe dilation of multiple segments of small bowel in the mid to upper abdomen measuring up to 7.9 cm in diameter, with enteric contrast present, but not definitively reaching the colon. These findings remain especially worrisome for a closed-loop small bowel obstruction. 2. These results will be called to the ordering clinician or representative by the Radiologist Assistant and communication documented in the PACS or [employer]. Electronically signed by: Rogelia Myers MD 04/03/2024 08:27 AM EST RP Workstation: [id number]   DG CHEST PORT 1  VIEW Result Date: 04/03/2024 EXAM: 1 VIEW(S) XRAY OF THE CHEST 04/03/2024 05:33:00 AM COMPARISON: Portable chest dated 04/02/2024 07:52 AM. CLINICAL HISTORY: [id number] Pneumothorax [id number] Pneumothorax FINDINGS: LINES, TUBES AND DEVICES: Pigtail tube thoracostomy in the lower lateral left chest stable in position, but appears kinked in 2 places outside the chest. NGT is well inside the stomach, but the side hole and tip are out of view. LUNGS AND PLEURA: There is continued improvement and left basilar consolidation/atelectasis with haziness and mild interstitial coarsening remaining. The remaining lungs although hypoinflated appear clear. There is no measurable pneumothorax. There is no substantial pleural effusion. HEART AND MEDIASTINUM: The cardiomediastinal silhouette is normal. There is aortic atherosclerosis. BONES AND SOFT TISSUES: Thoracic spondylosis. Old right hemithyroidectomy. No acute osseous abnormality. IMPRESSION: 1. No measurable pneumothorax. 2. Pigtail tube thoracostomy in the lower lateral left chest is stable in position but appears kinked in two places outside the chest. 3. Continued improvement in left basilar consolidation/atelectasis with residual haziness and mild interstitial coarsening. Electronically signed by: Francis Quam MD 04/03/2024 05:55 AM EST RP Workstation: [id number]   DG Abd Portable 1V Result Date: 04/02/2024 CLINICAL DATA:  Small bowel obstruction. EXAM: PORTABLE ABDOMEN - 1 VIEW COMPARISON:  1 day prior FINDINGS: Markedly dilated opacified small  bowel loops in the central abdomen and upper pelvis are similar to prior. The does appear to be some gas in the transverse colon and splenic flexure although no colonic contrast evident. NG tube remains in place with catheter either folded back on itself in the gastric antrum or potentially with the tip of the NG tube in a transpyloric position in the proximal duodenum. Left base collapse/consolidation with effusion evident with  incomplete visualization of the left chest tube. IMPRESSION: 1. Markedly dilated opacified small bowel loops in the central abdomen and upper pelvis are similar to prior. There does appear to be some gas in the transverse colon and splenic flexure although no colonic contrast evident. Imaging features remain compatible with small bowel obstruction. 2. NG tube remains in place with catheter either folded back on itself in the gastric antrum or potentially with the tip of the NG tube in a transpyloric position in the proximal duodenum. Electronically Signed   By: Camellia Candle M.D.   On: 04/02/2024 08:32   DG CHEST PORT 1 VIEW Result Date: 04/02/2024 CLINICAL DATA:  Chest tube. EXAM: PORTABLE CHEST 1 VIEW COMPARISON:  04/01/2024 FINDINGS: 0752 hours. Interval improvement in aeration of the left lung with decreased opacity over the left mid and lower hemithorax. Left chest tube remains in place without discernible left-sided pneumothorax. Left base collapse/consolidation with small effusion evident. Right lung clear. Cardiopericardial silhouette is at upper limits of normal for size. The NG tube passes into the stomach although the distal tip position is not included on the film. Telemetry leads overlie the chest. IMPRESSION: 1. Interval improvement in aeration of the left lung with decreased opacity over the left mid and lower hemithorax. 2. Left base collapse/consolidation with small effusion. Electronically Signed   By: Camellia Candle M.D.   On: 04/02/2024 08:21   DG Abd Portable 1V-Small Bowel Obstruction Protocol-initial, 8 hr delay Result Date: 04/01/2024 CLINICAL DATA:  Bowel obstruction EXAM: PORTABLE ABDOMEN - 1 VIEW COMPARISON:  03/31/2024, CT 03/31/2024, CT 03/29/2024 FINDINGS: Enteric tube is folded back upon itself distally in the region of the distal stomach with the tube tip directed to the left, cannot exclude kink in the catheter. Contrast within the urinary bladder. Dilute contrast within  markedly dilated small bowel, the small bowel is dilated up to 7.8 cm. No contrast in the colon IMPRESSION: 1. Enteric tube is folded back upon itself distally in the region of the distal stomach with the tube tip directed to the left, cannot exclude kink in the catheter. 2. Dilute contrast within markedly dilated small bowel consistent with small bowel obstruction, closed loop physiology not excluded. No contrast in the colon. Electronically Signed   By: Luke Bun M.D.   On: 04/01/2024 20:20   DG CHEST PORT 1 VIEW Result Date: 04/01/2024 CLINICAL DATA:  Post chest tube placement. EXAM: PORTABLE CHEST 1 VIEW COMPARISON:  03/29/2024 FINDINGS: Interval placement of left-sided chest tube over the mid to lower thorax. Enteric tube courses to the region of the stomach and has tip over the midline which 2 within the distal stomach versus duodenum. No significant change in opacification over approximately 2/3 of the left hemithorax compatible with known large effusion and associated basilar compressive atelectasis. No pneumothorax. Right lung is clear. Under the exam is unchanged. IMPRESSION: 1. Interval placement of left-sided chest tube over the mid to lower thorax. No pneumothorax. 2. No significant change in opacification over approximately 2/3 of the left hemithorax compatible with known large effusion and associated  basilar compressive atelectasis. Electronically Signed   By: Toribio Agreste M.D.   On: 04/01/2024 15:53   CT ABDOMEN PELVIS W CONTRAST Result Date: 03/31/2024 EXAM: CT ABDOMEN AND PELVIS WITH CONTRAST 03/31/2024 07:48:30 PM TECHNIQUE: CT of the abdomen and pelvis was performed with the administration of intravenous contrast. Multiplanar reformatted images are provided for review. Automated exposure control, iterative reconstruction, and/or weight-based adjustment of the mA/kV was utilized to reduce the radiation dose to as low as reasonably achievable. 100 mL of iohexol  (OMNIPAQUE ) 300 MG/ML  solution was administered intravenously. COMPARISON: CT Abdomen and Pelvis dated 03/29/2024. Clinical history for the comparison study is abdominal distention, possible obstruction. CLINICAL HISTORY: Bowel obstruction suspected. FINDINGS: LOWER CHEST: Small right-sided pleural effusion is noted. No focal infiltrate is seen. Left lower lobe consolidation is again identified and stable. Large loculated appearing left pleural effusion is again seen. LIVER: The liver is within normal limits. GALLBLADDER AND BILE DUCTS: The gallbladder is decompressed. No biliary ductal dilatation. SPLEEN: The spleen is unremarkable. PANCREAS: The pancreas is within normal limits. ADRENAL GLANDS: The adrenal glands are within normal limits. KIDNEYS, URETERS AND BLADDER: The kidneys demonstrate a normal enhancement pattern bilaterally. No renal calculi or obstructive changes are seen. No perinephric or periureteral stranding. The bladder is well distended with partially opacified urine from prior CT examination. GI AND BOWEL: The stomach is decompressed by a gastric catheter. The proximal small bowel, however, just beyond the ligament of Treitz shows multiple significantly dilated loops of small bowel up to at least 6 cm in diameter. Transition zones are identified just beyond the ligament of Treitz best seen on image number 40 of series 2 and image number 65 of series 6 proximally with a transition zone distally best seen on image number 40 of series 2 and image number 53 of series 6. This is consistent with small bowel obstruction and these are likely related to adhesions from prior surgery. The more distal small bowel is decompressed. The colon shows no obstructive or inflammatory changes. Scattered fecal material is seen. PERITONEUM AND RETROPERITONEUM: No free fluid is seen. No free air. VASCULATURE: Mild atherosclerotic calcifications of the aorta are noted without aneurysmal dilatation. LYMPH NODES: No lymphadenopathy. REPRODUCTIVE  ORGANS: The uterus has been surgically removed. BONES AND SOFT TISSUES: No acute osseous abnormality. IMPRESSION: 1. Small bowel obstruction with proximal transition point just beyond the ligament of Treitz, likely due to adhesions from prior surgery. 2. Stable large loculated left pleural effusion with unchanged left lower lobe consolidation. 3. Small right pleural effusion. Electronically signed by: Oneil Devonshire MD 03/31/2024 08:18 PM EST RP Workstation: [id number]   DG Abd 1 View Result Date: 03/31/2024 CLINICAL DATA:  Enteric catheter placement EXAM: ABDOMEN - 1 VIEW COMPARISON:  03/31/2024 at 12:23 p.m. FINDINGS: Supine frontal view of the lower chest and upper abdomen demonstrates enteric catheter passing below diaphragm, tip and side port coiled over the gastric body. Persistent gaseous distension of the small bowel consistent with obstruction. Stable eventration of the left hemidiaphragm. IMPRESSION: 1. Enteric catheter tip and side port projecting over the gastric body. 2. Stable small bowel obstruction. Electronically Signed   By: Ozell Daring M.D.   On: 03/31/2024 15:44   DG Abd 1 View Result Date: 03/31/2024 CLINICAL DATA:  Nausea and vomiting. EXAM: ABDOMEN - 1 VIEW COMPARISON:  10/06/2022, CT 03/29/2024 FINDINGS: Interval change in bowel gas pattern with several air-filled dilated small bowel loops present. There are several air-filled prominent bowel loops in the central abdomen likely  representing small bowel loops. There is air and stool over the rectum. No definite free peritoneal air. Remainder of the exam is unchanged. IMPRESSION: Interval change in bowel gas pattern with several air-filled dilated small bowel loops as well as several air-filled prominent bowel loops in the central abdomen likely representing small bowel loops. Findings likely due to early/partial small bowel obstruction and less likely ileus. Electronically Signed   By: Toribio Agreste M.D.   On: 03/31/2024 13:27   CT  CHEST ABDOMEN PELVIS W CONTRAST Result Date: 03/29/2024 CLINICAL DATA:  Pleural effusion, malignancy suspected. Pneumonia. * Tracking Code: BO * EXAM: CT CHEST, ABDOMEN, AND PELVIS WITH CONTRAST TECHNIQUE: Multidetector CT imaging of the chest, abdomen and pelvis was performed following the standard protocol during bolus administration of intravenous contrast. RADIATION DOSE REDUCTION: This exam was performed according to the departmental dose-optimization program which includes automated exposure control, adjustment of the mA and/or kV according to patient size and/or use of iterative reconstruction technique. CONTRAST:  OMNIPAQUE  IOHEXOL  300 MG/ML  SOLN COMPARISON:  CT chest 12/27/2023, CT abdomen pelvis 10/09/2023, PET 02/26/2023. FINDINGS: CT CHEST FINDINGS Cardiovascular: Atherosclerotic calcification of the aorta and coronary arteries. Heart size normal. No pericardial effusion. Left ventricle appears hypertrophied. Small pericardial effusion. Mediastinum/Nodes: No pathologically enlarged mediastinal, hilar or axillary lymph nodes. Thoracic inlet lymph nodes are not enlarged by CT size criteria. 10 mm prepericardiac or left juxtadiaphragmatic lymph node (6/54), new. Partial right thyroidectomy. Esophagus is grossly unremarkable. Lungs/Pleura: Large highly loculated left pleural effusion with compressive atelectasis in the left lung. There is central low-attenuation in the left lower lobe (6/48). Trace right pleural effusion. Airway is unremarkable. Musculoskeletal: Degenerative changes in the spine. No worrisome lytic or sclerotic lesions. Mild compression of the T6 superior endplate, old. CT ABDOMEN PELVIS FINDINGS Hepatobiliary: Liver and gallbladder are unremarkable. No biliary ductal dilatation. Pancreas: Negative. Spleen: Negative. Adrenals/Urinary Tract: Adrenal glands and right kidney are unremarkable. Small low-attenuation lesion in the left kidney, too small to characterize. No specific  follow-up necessary. Ureters are decompressed. Bladder is grossly unremarkable. Stomach/Bowel: Stomach and small bowel are unremarkable. Appendix is not readily visualized. Moderate stool burden. Rectal prolapse. Vascular/Lymphatic: Atherosclerotic calcification of the aorta. No pathologically enlarged lymph nodes. Reproductive: Hysterectomy.  No adnexal mass. Other: No free fluid. Musculoskeletal: Degenerative changes in the spine. No worrisome lytic or sclerotic lesions. Grade 1 anterolisthesis of L4 on L5. IMPRESSION: 1. Large highly located left pleural effusion with compressive atelectasis throughout the left lung. 2. Central low-attenuation in the left lower lobe may be due to radiation scarring. Recommend attention on follow-up. 3. Small pericardial effusion and trace right pleural effusion. 4. Moderate stool burden.  Rectal prolapse. 5. Aortic atherosclerosis (ICD10-I70.0). Coronary artery calcification. Electronically Signed   By: Newell Eke M.D.   On: 03/29/2024 15:15   CT Head Wo Contrast Result Date: 03/29/2024 EXAM: CT HEAD WITHOUT CONTRAST 03/29/2024 01:49:45 PM TECHNIQUE: CT of the head was performed without the administration of intravenous contrast. Automated exposure control, iterative reconstruction, and/or weight based adjustment of the mA/kV was utilized to reduce the radiation dose to as low as reasonably achievable. COMPARISON: CT head 07/24/2021 CLINICAL HISTORY: Mental status change, unknown cause FINDINGS: BRAIN AND VENTRICLES: No acute hemorrhage. No evidence of acute infarct. No hydrocephalus. No extra-axial collection. No mass effect or midline shift. ORBITS: No acute abnormality. SINUSES: Scattered paranasal sinus mucosal thickening and opacified right frontal sinus. SOFT TISSUES AND SKULL: No skull fracture. IMPRESSION: 1. No acute intracranial abnormality. Electronically signed  by: Gilmore Molt MD 03/29/2024 02:07 PM EST RP Workstation: [id number]   DG Chest Port 1  View Result Date: 03/29/2024 EXAM: 1 VIEW(S) XRAY OF THE CHEST 03/29/2024 10:26:00 AM COMPARISON: CT chest from 12/27/2023. CLINICAL HISTORY: Weakness. History of lung cancer. FINDINGS: LUNGS AND PLEURA: There is near complete opacification of the left lung. These findings may reflect large malignant pleural effusion, central hilar mass, lobar atelectasis, and/or pneumonia. Right lung appears clear. No pulmonary edema. No pneumothorax. Consider further evaluation with contrast-enhanced CT of the chest to assess for underlying recurrent tumor and/or malignant pleural effusion. HEART AND MEDIASTINUM: The cardiac and mediastinal silhouettes are obscured by the near complete opacification of the left lung. A central hilar mass is considered in the differential for the left lung opacification. BONES AND SOFT TISSUES: No acute osseous abnormality. IMPRESSION: 1. Near-complete opacification of the left lung, differential includes large malignant pleural effusion, central hilar mass with obstruction, lobar atelectasis, and/or pneumonia. 2. Recommend contrast-enhanced chest CT for evaluation of recurrent tumor and/or malignant pleural effusion. Electronically signed by: Waddell Calk MD 03/29/2024 10:50 AM EST RP Workstation: [id number]    Labs:  CBC: Recent Labs    04/13/24 0245 04/15/24 1508 04/16/24 1438 04/17/24 0337  WBC 17.4* 24.4* 21.0* 19.4*  HGB 8.3* 8.6* 7.5* 7.3*  HCT 25.2* 26.4* 22.8* 22.3*  PLT 232 272 268 278    COAGS: Recent Labs    03/29/24 1835  INR 1.2    BMP: Recent Labs    04/12/24 0237 04/13/24 0245 04/14/24 0401 04/16/24 0327  NA 134* 136 136 134*  K 4.2 3.9 4.2 4.3  CL 104 103 103 100  CO2 24 26 27 29   GLUCOSE 220* 137* 133* 111*  BUN 16 17 20 23   CALCIUM  8.0* 8.2* 8.1* 8.2*  CREATININE 0.52 0.55 0.52 0.56  GFRNONAA >60 >60 >60 >60    LIVER FUNCTION TESTS: Recent Labs    04/08/24 0425 04/09/24 1039 04/13/24 0245 04/16/24 0327  BILITOT 0.2 0.5 0.3 <0.2   AST 15 17 15 17   ALT 5 5 6 7   ALKPHOS 59 58 51 51  PROT 5.8* 5.9* 5.7* 5.5*  ALBUMIN  2.1* 2.1* 2.3* 2.2*    TUMOR MARKERS: No results for input(s): AFPTM, CEA, CA199, CHROMGRNA in the last 8760 hours.  Assessment and Plan: Post operative abdominal fluid collections s/p LUQ and LLQ fluid collection drains placed on 04/10/2024 by Dr. Jenna-  Patient underwent abdominal drain injections today which revealed no fistulas. Both drains were removed. Gauze dressings applied. Patient tolerated well.    Thank you for allowing our service to participate in Angelica [name] 's care.    Electronically Signed: Niall Illes B Psalm Schappell, NP   04/17/2024, 2:32 PM     I spent a total of 20 Minutes in face to face in clinical consultation, greater than 50% of which was counseling/coordinating care for drain follow up.   (A copy of this note was sent to the referring provider and the time of visit.)

## 2024-04-18 DIAGNOSIS — J918 Pleural effusion in other conditions classified elsewhere: Secondary | ICD-10-CM | POA: Diagnosis not present

## 2024-04-18 DIAGNOSIS — J189 Pneumonia, unspecified organism: Secondary | ICD-10-CM | POA: Diagnosis not present

## 2024-04-18 LAB — CBC
HCT: 23.5 % — ABNORMAL LOW (ref 36.0–46.0)
Hemoglobin: 7.4 g/dL — ABNORMAL LOW (ref 12.0–15.0)
MCH: 29.5 pg (ref 26.0–34.0)
MCHC: 31.5 g/dL (ref 30.0–36.0)
MCV: 93.6 fL (ref 80.0–100.0)
Platelets: 282 K/uL (ref 150–400)
RBC: 2.51 MIL/uL — ABNORMAL LOW (ref 3.87–5.11)
RDW: 16.5 % — ABNORMAL HIGH (ref 11.5–15.5)
WBC: 14.4 K/uL — ABNORMAL HIGH (ref 4.0–10.5)
nRBC: 0 % (ref 0.0–0.2)

## 2024-04-18 LAB — GLUCOSE, CAPILLARY
Glucose-Capillary: 95 mg/dL (ref 70–99)
Glucose-Capillary: 104 mg/dL — ABNORMAL HIGH (ref 70–99)
Glucose-Capillary: 134 mg/dL — ABNORMAL HIGH (ref 70–99)
Glucose-Capillary: 86 mg/dL (ref 70–99)

## 2024-04-18 MED ORDER — BISMUTH SUBSALICYLATE 262 MG/15ML PO SUSP: 30.0000 mL | Freq: Three times a day (TID) | ORAL | Status: DC | PRN

## 2024-04-18 MED ORDER — WITCH HAZEL-GLYCERIN EX PADS: MEDICATED_PAD | CUTANEOUS | Status: DC | PRN

## 2024-04-18 MED ORDER — MORPHINE SULFATE (PF) 2 MG/ML IV SOLN
2.0000 mg | INTRAVENOUS | Status: DC | PRN
  Administered 2024-04-19: 2 mg via INTRAVENOUS
  Filled 2024-04-18: qty 1

## 2024-04-18 MED ORDER — IRON SUCROSE 200 MG IVPB - SIMPLE MED
200.0000 mg | Freq: Once | Status: AC
  Administered 2024-04-18: 200 mg via INTRAVENOUS
  Filled 2024-04-18: qty 200

## 2024-04-18 MED ORDER — OXYCODONE HCL 5 MG PO TABS
5.0000 mg | ORAL_TABLET | ORAL | Status: DC | PRN
  Administered 2024-04-18 (×2): 5 mg via ORAL
  Administered 2024-04-18 – 2024-04-19 (×3): 10 mg via ORAL
  Filled 2024-04-18: qty 2
  Filled 2024-04-18: qty 1
  Filled 2024-04-18: qty 2
  Filled 2024-04-18: qty 1
  Filled 2024-04-18: qty 2

## 2024-04-18 MED ORDER — CALCIUM POLYCARBOPHIL 625 MG PO TABS
625.0000 mg | ORAL_TABLET | Freq: Two times a day (BID) | ORAL | Status: DC
  Administered 2024-04-18 – 2024-04-23 (×11): 625 mg via ORAL
  Filled 2024-04-18 (×11): qty 1

## 2024-04-18 MED ORDER — ACETAMINOPHEN 500 MG PO TABS
1000.0000 mg | ORAL_TABLET | Freq: Three times a day (TID) | ORAL | Status: DC
  Administered 2024-04-18 – 2024-04-19 (×3): 1000 mg via ORAL
  Filled 2024-04-18 (×4): qty 2

## 2024-04-18 NOTE — Progress Notes (Signed)
 Brief Nutrition Note  Received new consult for patient being followed by RD team for initiation of kcal count. TPN has been discontinued and patient on regular diet now.  Messaged RN and discussed. RD team not available in-person on weekends, but RN to hang envelope on pt door to collect meal tickets and RD will review and report results on 12/1. Instructions for completion are in calorie count nursing order. For last full nutrition assessment, see RD note from 11/25.    Vernell Lukes, RD, LDN, CNSC Registered Dietitian II Please reach out via secure chat

## 2024-04-18 NOTE — Progress Notes (Addendum)
 Pts right arm with PICC is swollen and feels tight. RN secure chat provider, and placed IV consult. Per provider may be able to discontinue PICC now that pt is off TPN, but if concerned about swelling have IV team come look at it. Per IV team it is ok to keep using the PICC, provider notified in secure chat message with both primary nurse and IV team nurse.

## 2024-04-18 NOTE — Progress Notes (Signed)
 Six hours after patients foley has been removed, pt is still due to void. Pt bladder scanned, and volume is 143 mL. Provider notified if I&O catheter should be done. Per provider give it some more time and I&O catheter not needed to be done at this time.

## 2024-04-18 NOTE — Progress Notes (Signed)
 Occupational Therapy Treatment Patient Details Name: Angelica [name] MRN: [id number] DOB: Oct 25, 1940 Today's Date: 04/18/2024   History of present illness Angelica [name] is a 83 yr old female who presented to the ED with complaints of weakness; admitted 11/9 with large loculated left pleural effusion.03/31/2024 diagnosed with SBO, s/p exploratory laparotomy 11/14, placed NGT 04/01/24 s/p pigtail chest tube placed. PMH: GERD, HTN, HLD, IDA, asthma, stage 3b adenocarcinoma of the lung, hypothyroidism, CVA, Alzheimer's, SBO due to adhesions in 2016 and 2021   OT comments  The patient required mod assist for supine to sit. She presented with fair sitting balance EOB while performing upper body grooming. She subsequently required mod assist to stand using a RW, then mod assist to take lateral steps towards the head of the bed. She was assisted back to bed. Her overall activity tolerance and pain response was improved today. She is making gradual functional progress. Continue OT plan of care. Patient will benefit from continued inpatient follow up therapy, <3 hours/day.       If plan is discharge home, recommend the following:  A lot of help with walking and/or transfers;A lot of help with bathing/dressing/bathroom;Assistance with cooking/housework;Direct supervision/assist for medications management;Assist for transportation;Direct supervision/assist for financial management;Help with stairs or ramp for entrance   Equipment Recommendations  None recommended by OT    Recommendations for Other Services      Precautions / Restrictions Precautions Precautions: Fall Precaution/Restrictions Comments: midline incision Restrictions Weight Bearing Restrictions Per Provider Order: No       Mobility Bed Mobility Overal bed mobility: Needs Assistance Bed Mobility: Supine to Sit, Rolling, Sit to Supine Rolling: Mod assist, Used rails   Supine to sit: Mod assist, HOB elevated, Used rails Sit to  supine: Max assist        Transfers Overall transfer level: Needs assistance Equipment used: Rolling walker (2 wheels) Transfers: Sit to/from Stand Sit to Stand: Mod assist, From elevated surface           General transfer comment: Required cues for hand placement and trunk extension in standing; she further required mod assist to take lateral steps towards the [facility], with cues for walker advancement and step sequencing     Balance     Sitting balance-Leahy Scale: Fair       Standing balance-Leahy Scale: Poor          ADL either performed or assessed with clinical judgement   ADL Overall ADL's : Needs assistance/impaired Eating/Feeding: Set up;Sitting Eating/Feeding Details (indicate cue type and reason): She drank water  from a cup seated EOB. Grooming: Set up;Supervision/safety;Wash/dry face;Wash/dry hands;Oral care Grooming Details (indicate cue type and reason): She performed hand washing, face washing and teeth brushing seated EOB.             Lower Body Dressing: Total assistance;Bed level Lower Body Dressing Details (indicate cue type and reason): assist needed to donn socks in bed                     Communication Communication Communication: Impaired Factors Affecting Communication: Difficulty expressing self   Cognition Arousal: Alert Behavior During Therapy: WFL for tasks assessed/performed Cognition: History of cognitive impairments         Attention impairment (select first level of impairment): Divided attention      Following commands impaired: Follows one step commands with increased time      Cueing   Cueing Techniques: Verbal cues, Gestural cues, Visual cues, Tactile cues  Exercises  Pertinent Vitals/ Pain       Pain Assessment Pain Assessment: 0-10 Pain Score: 5  Pain Location: abdomen Pain Intervention(s): Limited activity within patient's tolerance, Monitored during session,  Repositioned   Frequency  Min 2X/week        Progress Toward Goals  OT Goals(current goals can now be found in the care plan section)  Progress towards OT goals: Progressing toward goals  Acute Rehab OT Goals OT Goal Formulation: With patient Time For Goal Achievement: 05/01/24 Potential to Achieve Goals: Good ADL Goals Pt Will Perform Grooming: with set-up;sitting Pt Will Perform Upper Body Bathing: with set-up;sitting Pt Will Transfer to Toilet: with contact guard assist;bedside commode;stand pivot transfer Pt Will Perform Toileting - Clothing Manipulation and hygiene: with min assist;sit to/from stand;sitting/lateral leans  Plan         AM-PAC OT 6 Clicks Daily Activity     Outcome Measure   Help from another person eating meals?: A Little Help from another person taking care of personal grooming?: A Little Help from another person toileting, which includes using toliet, bedpan, or urinal?: A Lot Help from another person bathing (including washing, rinsing, drying)?: A Lot Help from another person to put on and taking off regular upper body clothing?: A Lot Help from another person to put on and taking off regular lower body clothing?: A Lot 6 Click Score: 14    End of Session Equipment Utilized During Treatment: Rolling walker (2 wheels)  OT Visit Diagnosis: Pain;Muscle weakness (generalized) (M62.81);Other abnormalities of gait and mobility (R26.89);Unsteadiness on feet (R26.81);Other symptoms and signs involving cognitive function Pain - part of body:  (abdomen)   Activity Tolerance Patient limited by pain   Patient Left in bed;with call bell/phone within reach;with bed alarm set   Nurse Communication Mobility status        Time: 1721-1736 OT Time Calculation (min): 15 min  Charges: OT General Charges $OT Visit: 1 Visit OT Treatments $Self Care/Home Management : 8-22 mins    [name], OTR/L 04/18/2024, 5:44 PM

## 2024-04-18 NOTE — Progress Notes (Signed)
 PROGRESS NOTE    Angelica [name]  FMW:[id number] DOB: 30-Nov-1940 DOA: 03/29/2024 PCP: Norleen Lynwood ORN, MD     Brief Narrative:  Angelica [name] is an 83yo with h/o GERD, HTN, HLD, IDA, asthma, stage 3b adenocarcinoma of the lung, hypothyroidism, CVA, Alzheimer's dementia, and hx of SBO due to adhesions in 2016 and 2021 who presented on 11/9 with weakness and breast swelling. She was noted to have leukocytosis with lactate of 2.1.  CXR with near-complete opacification of the left lung, differential includes large malignant pleural effusion, central hilar mass with obstruction, lobar atelectasis, and/or pneumonia.  CT A/P with large highly located left pleural effusion with compressive atelectasis throughout the left lung.  She was started on antibiotics, pulmonary consulted.  11/11 also found to have SBO, NG tube placed, surgery consulted.  Underwent lysis of adhesions with small bowel resection on 11/14.  On TPN.    New events last 24 hours / Subjective: Having some pain this morning, just received pain medication.  TPN has been weaned off last night  Assessment & Plan:  Principal Problem:   Pleural effusion associated with pulmonary infection vs. recurrent malignancy and sepsis Active Problems:   Weakness   Adenocarcinoma of left lung, stage 3 (HCC)   HTN (hypertension)   Elevated troponin   HLD (hyperlipidemia)   History of stroke   Hypothyroidism   Asthma, severe persistent, well-controlled (HCC)   Alzheimer's disease (HCC)   GERD (gastroesophageal reflux disease)   Constipation   SBO (small bowel obstruction) (HCC)   Malnutrition of moderate degree   Hypokalemia   Sepsis (HCC)   Prediabetes   Sinus bradycardia   Electrolyte abnormality   Pressure injury of sacral region, stage 2 (HCC)   Acute urinary retention   Pleural effusion - Insetting of pulmonary infection versus recurrent malignancy - Chest tube placed 11/12, removed 11/15 - Fluid studies suggest possible  infectious etiology due to elevated WBC count, elevated LDH, elevated protein fluid but culture negative.  Cytology negative - Appreciate PCCM, now signed off  SBO s/p ex lap with small bowel resection on 11/14 - Post operative abdominal fluid collections s/p LUQ and LLQ fluid collection drains placed on 11/21 by Dr Jenna.  Drain has been removed 11/28 - Advanced to soft diet.  Now off TPN.  General surgery following - Abscess culture showing achromobacter. Discussed with pharmacy. Changed vancomycin /cefepime  --> Merrem  through 12/3. Susceptibility pending, sent out to [facility]  RUE DVT - Full dose lovenox    Demand ischemia - Stable  Hyperlipidemia - Pravachol    History of stroke - Plavix  currently on hold  History of adenocarcinoma of left lung, stage III - Diagnosed in 2022 and received chemoradiation at that time  Alzheimer's disease  GERD - Protonix   Prediabetes - A1c 5.9 - Sliding scale insulin   Hypothyroidism - Synthroid    Generalized weakness, deconditioning - PT OT - SNF recommended  Acute urinary retention - Flomax  - Remove Foley catheter today  Pressure injury sacral region stage II, not present on admission   DVT prophylaxis: Lovenox  Code Status: Full Family Communication: Multiple family members at bedside  Disposition Plan: SNF Status is: Inpatient Remains inpatient appropriate because: IV antibiotics.  Hopeful DC to SNF soon   Antimicrobials:  Anti-infectives (From admission, onward)    Start     Dose/Rate Route Frequency Ordered Stop   04/16/24 1800  meropenem  (MERREM ) 1 g in sodium chloride  0.9 % 100 mL IVPB        1 g  200 mL/hr over 30 Minutes Intravenous Every 12 hours 04/16/24 1056 04/22/24 2359   04/15/24 1400  meropenem  (MERREM ) 1 g in sodium chloride  0.9 % 100 mL IVPB  Status:  Discontinued        1 g 200 mL/hr over 30 Minutes Intravenous Every 8 hours 04/15/24 0915 04/16/24 1056   04/06/24 1300  metroNIDAZOLE  (FLAGYL ) IVPB 500 mg   Status:  Discontinued        500 mg 100 mL/hr over 60 Minutes Intravenous Every 12 hours 04/06/24 1217 04/15/24 0915   04/03/24 1330  metroNIDAZOLE  (FLAGYL ) IVPB 500 mg        500 mg 100 mL/hr over 60 Minutes Intravenous On call to O.R. 04/03/24 1242 04/03/24 1507   03/30/24 2200  vancomycin  (VANCOREADY) IVPB 750 mg/150 mL  Status:  Discontinued        750 mg 150 mL/hr over 60 Minutes Intravenous Every 24 hours 03/29/24 1620 04/03/24 1310   03/30/24 0400  ceFEPIme  (MAXIPIME ) 2 g in sodium chloride  0.9 % 100 mL IVPB  Status:  Discontinued        2 g 200 mL/hr over 30 Minutes Intravenous Every 12 hours 03/29/24 1620 04/15/24 0915   03/29/24 1530  vancomycin  (VANCOCIN ) IVPB 1000 mg/200 mL premix        1,000 mg 200 mL/hr over 60 Minutes Intravenous  Once 03/29/24 1517 03/30/24 0228   03/29/24 1530  ceFEPIme  (MAXIPIME ) 2 g in sodium chloride  0.9 % 100 mL IVPB        2 g 200 mL/hr over 30 Minutes Intravenous  Once 03/29/24 1518 03/29/24 1724        Objective: Vitals:   04/17/24 1952 04/17/24 2140 04/18/24 0538 04/18/24 0748  BP:  (!) 131/104 122/65   Pulse:  90 85   Resp:  16 15   Temp:  98.2 F (36.8 C) 97.9 F (36.6 C)   TempSrc:  Oral Oral   SpO2: 99% 99% 98% 96%  Weight:      Height:        Intake/Output Summary (Last 24 hours) at 04/18/2024 1138 Last data filed at 04/18/2024 0542 Gross per 24 hour  Intake 766.89 ml  Output 400 ml  Net 366.89 ml   Filed Weights   04/03/24 1327 04/07/24 0400 04/09/24 0524  Weight: 59.6 kg 58.8 kg 62.7 kg    Examination:  General exam: Appears uncomfortable Gastrointestinal system: Abdomen is nondistended Central nervous system: Alert  Extremities: Right forearm swollen, improved from yesterday   Data Reviewed: I have personally reviewed following labs and imaging studies  CBC: Recent Labs  Lab 04/13/24 0245 04/15/24 1508 04/16/24 1438 04/17/24 0337 04/18/24 0337  WBC 17.4* 24.4* 21.0* 19.4* 14.4*  NEUTROABS  --  21.5*   --   --   --   HGB 8.3* 8.6* 7.5* 7.3* 7.4*  HCT 25.2* 26.4* 22.8* 22.3* 23.5*  MCV 89.7 92.6 91.9 92.9 93.6  PLT 232 272 268 278 282   Basic Metabolic Panel: Recent Labs  Lab 04/12/24 0237 04/13/24 0245 04/14/24 0401 04/16/24 0327  NA 134* 136 136 134*  K 4.2 3.9 4.2 4.3  CL 104 103 103 100  CO2 24 26 27 29   GLUCOSE 220* 137* 133* 111*  BUN 16 17 20 23   CREATININE 0.52 0.55 0.52 0.56  CALCIUM  8.0* 8.2* 8.1* 8.2*  MG 1.8 1.9 1.8 2.0  PHOS 2.4* 2.5 2.5 2.5   GFR: Estimated Creatinine Clearance: 47.1 mL/min (by C-G formula based on  SCr of 0.56 mg/dL). Liver Function Tests: Recent Labs  Lab 04/13/24 0245 04/16/24 0327  AST 15 17  ALT 6 7  ALKPHOS 51 51  BILITOT 0.3 <0.2  PROT 5.7* 5.5*  ALBUMIN  2.3* 2.2*   No results for input(s): LIPASE, AMYLASE in the last 168 hours. No results for input(s): AMMONIA in the last 168 hours. Coagulation Profile: No results for input(s): INR, PROTIME in the last 168 hours. Cardiac Enzymes: No results for input(s): CKTOTAL, CKMB, CKMBINDEX, TROPONINI in the last 168 hours. BNP (last 3 results) No results for input(s): PROBNP in the last 8760 hours. HbA1C: No results for input(s): HGBA1C in the last 72 hours. CBG: Recent Labs  Lab 04/17/24 1241 04/17/24 1641 04/17/24 2144 04/18/24 0650 04/18/24 1118  GLUCAP 110* 130* 145* 95 104*   Lipid Profile: No results for input(s): CHOL, HDL, LDLCALC, TRIG, CHOLHDL, LDLDIRECT in the last 72 hours.  Thyroid  Function Tests: No results for input(s): TSH, T4TOTAL, FREET4, T3FREE, THYROIDAB in the last 72 hours. Anemia Panel: No results for input(s): VITAMINB12, FOLATE, FERRITIN, TIBC, IRON , RETICCTPCT in the last 72 hours. Sepsis Labs: No results for input(s): PROCALCITON, LATICACIDVEN in the last 168 hours.  Recent Results (from the past 240 hours)  Aerobic/Anaerobic Culture w Gram Stain (surgical/deep wound)     Status: None    Collection Time: 04/10/24  1:52 PM   Specimen: Abscess  Result Value Ref Range Status   Specimen Description   Final    ABSCESS Performed at [facility], [address], [place], [place] [address]    Special Requests   Final     drain 2 Performed at [facility], [address], [place], [place] [address]    Gram Stain   Final    FEW WBC PRESENT, PREDOMINANTLY PMN NO ORGANISMS SEEN    Culture   Final    No growth aerobically or anaerobically. Performed at [facility], [address], [place], [place] [address]    Report Status 04/15/2024 FINAL  Final  Aerobic/Anaerobic Culture w Gram Stain (surgical/deep wound)     Status: None (Preliminary result)   Collection Time: 04/10/24  1:52 PM   Specimen: Abscess  Result Value Ref Range Status   Specimen Description   Final    ABSCESS Performed at [facility], [address] [place], [place] [address]    Special Requests   Final    DRAIN 1 Performed at [facility], [address], [place], [place] [address]    Gram Stain   Final    MODERATE WBC PRESENT, PREDOMINANTLY PMN NO ORGANISMS SEEN    Culture   Final    RARE ACHROMOBACTER SPECIES Sent to [facility] for further susceptibility testing. NO ANAEROBES ISOLATED Performed at [facility], [address], [place], [place] [address]    Report Status PENDING  Incomplete      Radiology Studies: DG Sinus/Fist Tube Chk-Non GI Result Date: 04/17/2024 INDICATION: Previous small-bowel obstructions status post laparotomy and partial small-bowel resection, postop abscess is post drain catheter placement x2. Recent CT shows resolution of extraluminal fluid collections. No further output from drain catheters. Removal requested. EXAM: INJECTION OF LEFT UPPER QUADRANT DRAIN CATHETER UNDER FLUOROSCOPY INJECTION OF LEFT LOWER QUADRANT DRAIN CATHETER UNDER FLUOROSCOPY MEDICATIONS: no periprocedural  antibiotics were indicated ANESTHESIA/SEDATION: None required PROCEDURE: Scout radiograph obtained. Contrast injection through the left lower quadrant catheter fills a small extraluminal collection. With aspiration, the contrast is  removed. In similar fashion, contrast injection of the left upper quadrant catheter fills a small lateral irregular collection in tracks back along the catheter tubing. With aspiration, most of the contrast is removed. No intraluminal contrast extension is noted from either injection. FLUOROSCOPY TIME:  Radiation Exposure Index (as provided by the fluoroscopic device): 22.5 mGy air Kerma COMPLICATIONS: None immediate. FINDINGS: Resolution of left abdominal abscesses post drain catheter placement. No evidence of fistula to bowel. The drain catheters were cut and removed. IMPRESSION: 1. Resolution of left abdominal abscesses post drain catheter placement. 2. No fistula to bowel. 3. Removal of drain catheters. Electronically Signed   By: JONETTA Faes M.D.   On: 04/17/2024 15:59   US  EKG SITE RITE Result Date: 04/16/2024 If Site Rite image not attached, placement could not be confirmed due to current cardiac rhythm.     Scheduled Meds:  acetaminophen   1,000 mg Oral Q8H   budesonide  (PULMICORT ) nebulizer solution  0.5 mg Nebulization BID   Chlorhexidine  Gluconate Cloth  6 each Topical Q0600   enoxaparin  (LOVENOX ) injection  60 mg Subcutaneous Q12H   feeding supplement ([employer] STANDARD ENT 1.4)  325 mL Oral BID BM   fluticasone   1 spray Each Nare Daily   hydrocortisone  cream   Topical BID   insulin  aspart  0-15 Units Subcutaneous TID WC   ipratropium  2 spray Each Nare TID   levothyroxine   50 mcg Oral QAC breakfast   methocarbamol  (ROBAXIN ) injection  500 mg Intravenous Q8H   montelukast   10 mg Oral QHS   pantoprazole   40 mg Oral BID   polycarbophil  625 mg Oral BID   pravastatin   80 mg Oral Daily   sodium chloride  flush  10-40 mL Intracatheter Q12H   sodium chloride   flush  5 mL Intracatheter Q8H   tamsulosin   0.4 mg Oral Daily   Continuous Infusions:  iron  sucrose     meropenem  (MERREM ) IV 1 g (04/18/24 0530)     LOS: 20 days   Time spent: 25 minutes   Delon Hoe, DO [facility] 04/18/2024, 11:38 AM   Available via Epic secure chat 7am-7pm After these hours, please refer to coverage provider listed on [email or web address]

## 2024-04-18 NOTE — Progress Notes (Signed)
 The IV Nurse rec'd a consult to assess the pt's right arm due to tightness and swelling in the arm with the PICC line;  the pt's arm is edematous, but the 2 family members at bedside both agreed and stated it looks better than it did yesterday;  PICC has fluids infusing to the purple port;  good blood return from Red port and flushed easily; will continue to assess, as pt is still on an antibiotic, and family is concerned that if the PICC is removed, the lab will have a hard time getting blood work.

## 2024-04-18 NOTE — Progress Notes (Signed)
 04/18/2024  Angelica [name] [id number] 1940/10/23  CARE TEAM: PCP: Norleen Lynwood ORN, MD  Outpatient Care Team: Patient Care Team: Norleen Lynwood ORN, MD as PCP - General (Internal Medicine) Evertt Lonell BROCKS, RN as Oncology Nurse Navigator (Oncology) Sherrod Sherrod, MD as Consulting Physician (Oncology) Jeneal Danita Macintosh, MD as Consulting Physician (Allergy) Brenna Adine CROME, DO as Consulting Physician (Pulmonary Disease) Rosemarie Eather GORMAN, MD as Consulting Physician (Neurology) [place], [place], LCSW as [facility] Management Cleatus Collar, MD as Consulting Physician (Ophthalmology)  Inpatient Treatment Team: Treatment Team:  Rojelio Nest, DO Ccs, Md, MD Bobbette Loupe, MD Leobardo Nella DEL, [place] Augustin Carolanne NOVAK, RN Reynolds Heather LABOR, LCSW   Problem List:   Principal Problem:   Pleural effusion associated with pulmonary infection vs. recurrent malignancy and sepsis Active Problems:   SBO (small bowel obstruction) (HCC)   HTN (hypertension)   Malnutrition of moderate degree   GERD (gastroesophageal reflux disease)   Asthma, severe persistent, well-controlled (HCC)   Hypokalemia   Constipation   HLD (hyperlipidemia)   Adenocarcinoma of left lung, stage 3 (HCC)   Alzheimer's disease (HCC)   Hypothyroidism   History of stroke   Weakness   Elevated troponin   Sepsis (HCC)   Prediabetes   Sinus bradycardia   Electrolyte abnormality   Pressure injury of sacral region, stage 2 (HCC)   Acute urinary retention   04/03/2024  PRE-OPERATIVE DIAGNOSIS:  Small bowel obstruction   POST-OPERATIVE DIAGNOSIS:  Same   PROCEDURE:   Exploratory laparotomy with lysis of adhesions x 100 minutes Small bowel resection Small bowel repair   SURGEON:  Lonni HERO. White, MD   FINDINGS:  Very distended chronically dilated appearing loop of small bowel which was also quite fragile and with gentle manipulation and attempts to eviscerate from the abdomen, did spontaneously perforate..   Apparent small bowel obstruction related to an adhesion from the omentum.  Did not appear to be a closed-loop configuration per se.  Small bowel resection carried out.  Additionally, during the extensive adhesiolysis, we encountered one 1 cm serosal tear that was repaired, inherent to the nature of the procedure   PATHOLOGY FINAL MICROSCOPIC DIAGNOSIS:   A. SEGMENT OF JEJUNUM, RESECTION:  - Segment of small intestine with mucosal ischemia, congestion, and  serositis; clinically small bowel obstruction.  - One reactive lymph node.  - Negative for malignancy     Assessment [facility] Stay = 20 days) 15 Days Post-Op    Stabilizing    Assessment/Plan POD 12, s/p ex lap with SBR, Dr. White 11/14 -CT from 11/21 showed fluid collections of abdomen.  IR drainage 11/21.  -RARE ACHROMOBACTER SPECIES growing.  Sensitivities pending.  On meropenem .-WBC falling from 34.8 down to 14.4 gradually.  Hopefully can wean off.  Perhaps 5 days after drain removal = 12/3.  -Output tapered off.  Drain study with no leak or fistula.  Drains removed by IR 11/28.  -Vitals stable.  -HGB in 7s.  Give IV iron .   -Tolerating soft diet.  Advance to more regular diet.  - Weaned off TPN 11/28.   -Mirilax bowel regimen.. -Mobilize as able -Will continue to follow   FEN -solid diet.  Calorie counts. See if can get rid of Foley catheter to avoid UTIs.  If fails I&O cath every 8 hours.  Replace Foley on second I&O cath if needed.  VTE - Lovenox  60 twice daily for right upper extremity axillary DVT.  History of Plavix .  Do not know if that  would be sufficient or not and can transition to that.  Defer to primary service/hematology  ID - Meropenem ; will continue 5 days after removal of drain = stop 12/3 as long as white count closer to normal and no new issues.    Hemodynamically stable.  Do not know if she really needs telemetry anymore.  See if they can stop.  Defer to primary service.   HTN HLD h/o  CVA Alzheimer's disease  Stage III adenocarcinoma of lung GERD Hypothyroidism PCM   Getting close to being discharged from surgery standpoint.  Will follow    I reviewed nursing notes, hospitalist notes, last 24 h vitals and pain scores, last 48 h intake and output, last 24 h labs and trends, and last 24 h imaging results.  I have reviewed this patient's available data, including medical history, events of note, test results, etc as part of my evaluation.   A significant portion of that time was spent in counseling. Care during the described time interval was provided by me.  This care required moderate level of medical decision making.  04/18/2024    Subjective: (Chief complaint)  Patient tired but okay.  Nursing and daughter in room.  Just had bowel movement.  Starting to eat more.  TPN stopped last night.  Objective:  Vital signs:  Vitals:   04/17/24 1952 04/17/24 2140 04/18/24 0538 04/18/24 0748  BP:  (!) 131/104 122/65   Pulse:  90 85   Resp:  16 15   Temp:  98.2 F (36.8 C) 97.9 F (36.6 C)   TempSrc:  Oral Oral   SpO2: 99% 99% 98% 96%  Weight:      Height:        Last BM Date : 04/17/24  Intake/Output   Yesterday:  11/28 0701 - 11/29 0700 In: 1006.9 [P.O.:240; I.V.:544.3; IV Piggyback:222.6] Out: 730 [Urine:725; Drains:5] This shift:  No intake/output data recorded.  Bowel function:  Flatus: YES  BM:  YES  Drain: (No drain) percutaneous drains removed 11/20   Physical Exam:  General: Pt awake/but not particularly interactive.  Mild discomfort but not toxic or sickly.  Tired.  no acute distress Eyes: PERRL, normal EOM.  Sclera clear.  No icterus Neuro: CN II-XII intact w/o focal sensory/motor deficits. Lymph: No head/neck/groin lymphadenopathy Psych:  No delerium/psychosis/paranoia.  Oriented x 2 HENT: Normocephalic, Mucus membranes moist.  No thrush Neck: Supple, No tracheal deviation.  No obvious thyromegaly Chest: No pain to chest  wall compression.  Good respiratory excursion.  No audible wheezing CV:  Pulses intact.  Regular rhythm.  No major extremity edema MS: Normal AROM mjr joints.  No obvious deformity  Abdomen: Soft.  Nondistended.  Mildly tender at incisions only.  No evidence of peritonitis.  No incarcerated hernias. Low midline incision clean with minimal granulation 11 x 1-4cm wide x 4 cm deep wound.   GU: Foley catheter in place.  Clear yellow urine Ext:   No deformity.  No mjr edema.  No cyanosis Skin: No petechiae / purpurea.  No major sores.  Warm and dry    Results:   Cultures: Recent Results (from the past 720 hours)  Culture, blood (Routine X 2) w Reflex to ID Panel     Status: None   Collection Time: 03/29/24 11:47 AM   Specimen: BLOOD  Result Value Ref Range Status   Specimen Description   Final    BLOOD BLOOD LEFT ARM Performed at [facility], [address]  [place], [place] [address]    Special Requests   Final    BOTTLES DRAWN AEROBIC AND ANAEROBIC Blood Culture results may not be optimal due to an inadequate volume of blood received in culture bottles Performed at [facility], [address], [place], [place] [address]    Culture   Final    NO GROWTH 5 DAYS Performed at [facility], [address], [place], [place] [address]    Report Status 04/03/2024 FINAL  Final  MRSA Next Gen by PCR, Nasal     Status: None   Collection Time: 03/30/24  1:01 PM   Specimen: Nasal Mucosa; Nasal Swab  Result Value Ref Range Status   MRSA by PCR Next Gen NOT DETECTED NOT DETECTED Final    Comment: (NOTE) The GeneXpert MRSA Assay (FDA approved for NASAL specimens only), is one component of a comprehensive MRSA colonization surveillance program. It is not intended to diagnose MRSA infection nor to guide or monitor treatment for MRSA infections. Test performance is not FDA approved in patients less than 71 years old. Performed at [facility], [address], [place], [place] [address]   Body fluid culture w Gram Stain     Status: None   Collection Time: 04/01/24  2:15 PM   Specimen: Pleural Fluid  Result Value Ref Range Status   Specimen Description   Final    PLEURAL Performed at [facility], [address], [place], [place] [address]    Special Requests   Final    LEFT LUNG Performed at [facility], [address] [place], [place] [address]    Gram Stain   Final    RARE WBC PRESENT, PREDOMINANTLY PMN NO ORGANISMS SEEN    Culture   Final    NO GROWTH 3 DAYS Performed at [facility], [address], [place], [place] [address]    Report Status 04/04/2024 FINAL  Final  Fungus Culture With Stain     Status: None (Preliminary result)   Collection Time: 04/01/24  2:15 PM   Specimen: Pleural Fluid  Result Value Ref Range Status   Fungus Stain Final report  Final    Comment: (NOTE) Performed At: [facility] [address] [place], [place] [id number] Jennette Shorter MD Ey:[id number]    Fungus (Mycology) Culture PENDING  Incomplete   Fungal Source PLEURAL  Final    Comment: Performed at [facility], [address], [place], [place] [address]  Fungus Culture Result     Status: None   Collection Time: 04/01/24  2:15 PM  Result Value Ref Range Status   Result 1 Comment  Final    Comment: (NOTE) KOH/Calcofluor preparation:  no fungus observed. Performed At: [facility] [address] [place], [place] [id number] Jennette Shorter MD Ey:[id number]   Culture, blood (Routine X 2) w Reflex to ID Panel     Status: None   Collection Time: 04/06/24  8:53 AM   Specimen: BLOOD  Result Value Ref Range Status   Specimen Description   Final    BLOOD BLOOD RIGHT HAND Performed at [facility], [address], [place], [place] [address]    Special Requests   Final    BOTTLES DRAWN AEROBIC ONLY Blood Culture  results may not be optimal due to an inadequate volume of blood received in culture bottles Performed at [facility], [address] [place], [place] [address]  Culture   Final    NO GROWTH 5 DAYS Performed at [facility], [address] [place], [place] [address]    Report Status 04/11/2024 FINAL  Final  Culture, blood (Routine X 2) w Reflex to ID Panel     Status: None   Collection Time: 04/06/24  8:57 AM   Specimen: BLOOD  Result Value Ref Range Status   Specimen Description   Final    BLOOD BLOOD RIGHT HAND Performed at [facility], [address], [place], [place] [address]    Special Requests   Final    BOTTLES DRAWN AEROBIC ONLY Blood Culture results may not be optimal due to an inadequate volume of blood received in culture bottles Performed at [facility], [address] [place], [place] [address]    Culture   Final    NO GROWTH 5 DAYS Performed at [facility], [address] [place], [place] [address]    Report Status 04/11/2024 FINAL  Final  Aerobic/Anaerobic Culture w Gram Stain (surgical/deep wound)     Status: None   Collection Time: 04/10/24  1:52 PM   Specimen: Abscess  Result Value Ref Range Status   Specimen Description   Final    ABSCESS Performed at [facility], [address], [place], [place] [address]    Special Requests   Final     drain 2 Performed at [facility], [address], [place], [place] [address]    Gram Stain   Final    FEW WBC PRESENT, PREDOMINANTLY PMN NO ORGANISMS SEEN    Culture   Final    No growth aerobically or anaerobically. Performed at [facility], [address] [place], [place] [address]    Report Status 04/15/2024 FINAL  Final  Aerobic/Anaerobic Culture w Gram Stain (surgical/deep wound)     Status: None (Preliminary result)   Collection Time: 04/10/24  1:52 PM   Specimen: Abscess  Result Value  Ref Range Status   Specimen Description   Final    ABSCESS Performed at [facility], [address] [place], [place] [address]    Special Requests   Final    DRAIN 1 Performed at [facility], [address], [place], [place] [address]    Gram Stain   Final    MODERATE WBC PRESENT, PREDOMINANTLY PMN NO ORGANISMS SEEN    Culture   Final    RARE ACHROMOBACTER SPECIES Sent to [facility] for further susceptibility testing. NO ANAEROBES ISOLATED Performed at [facility], [address], [place], [place] [address]    Report Status PENDING  Incomplete    Labs: Results for orders placed or performed during the hospital encounter of 03/29/24 (from the past 48 hours)  Glucose, capillary     Status: Abnormal   Collection Time: 04/16/24 12:01 PM  Result Value Ref Range   Glucose-Capillary 123 (H) 70 - 99 mg/dL    Comment: Glucose reference range applies only to samples taken after fasting for at least 8 hours.  CBC     Status: Abnormal   Collection Time: 04/16/24  2:38 PM  Result Value Ref Range   WBC 21.0 (H) 4.0 - 10.5 K/uL   RBC 2.48 (L) 3.87 - 5.11 MIL/uL   Hemoglobin 7.5 (L) 12.0 - 15.0 g/dL   HCT 77.1 (L) 63.9 - 53.9 %   MCV 91.9 80.0 - 100.0 fL  MCH 30.2 26.0 - 34.0 pg   MCHC 32.9 30.0 - 36.0 g/dL   RDW 84.4 88.4 - 84.4 %   Platelets 268 150 - 400 K/uL   nRBC 0.0 0.0 - 0.2 %    Comment: Performed at [facility], [address] [place], [place] [address]  Glucose, capillary     Status: Abnormal   Collection Time: 04/16/24  4:41 PM  Result Value Ref Range   Glucose-Capillary 119 (H) 70 - 99 mg/dL    Comment: Glucose reference range applies only to samples taken after fasting for at least 8 hours.  Glucose, capillary     Status: Abnormal   Collection Time: 04/16/24  8:42 PM  Result Value Ref Range   Glucose-Capillary 156 (H) 70 - 99 mg/dL    Comment: Glucose reference range applies only to samples taken  after fasting for at least 8 hours.  Glucose, capillary     Status: Abnormal   Collection Time: 04/17/24 12:03 AM  Result Value Ref Range   Glucose-Capillary 101 (H) 70 - 99 mg/dL    Comment: Glucose reference range applies only to samples taken after fasting for at least 8 hours.  CBC     Status: Abnormal   Collection Time: 04/17/24  3:37 AM  Result Value Ref Range   WBC 19.4 (H) 4.0 - 10.5 K/uL   RBC 2.40 (L) 3.87 - 5.11 MIL/uL   Hemoglobin 7.3 (L) 12.0 - 15.0 g/dL   HCT 77.6 (L) 63.9 - 53.9 %   MCV 92.9 80.0 - 100.0 fL   MCH 30.4 26.0 - 34.0 pg   MCHC 32.7 30.0 - 36.0 g/dL   RDW 84.4 88.4 - 84.4 %   Platelets 278 150 - 400 K/uL   nRBC 0.0 0.0 - 0.2 %    Comment: Performed at [facility], [address] [place], [place] [address]  Glucose, capillary     Status: Abnormal   Collection Time: 04/17/24  4:03 AM  Result Value Ref Range   Glucose-Capillary 107 (H) 70 - 99 mg/dL    Comment: Glucose reference range applies only to samples taken after fasting for at least 8 hours.  Glucose, capillary     Status: Abnormal   Collection Time: 04/17/24  7:55 AM  Result Value Ref Range   Glucose-Capillary 130 (H) 70 - 99 mg/dL    Comment: Glucose reference range applies only to samples taken after fasting for at least 8 hours.  Glucose, capillary     Status: Abnormal   Collection Time: 04/17/24 12:41 PM  Result Value Ref Range   Glucose-Capillary 110 (H) 70 - 99 mg/dL    Comment: Glucose reference range applies only to samples taken after fasting for at least 8 hours.  Glucose, capillary     Status: Abnormal   Collection Time: 04/17/24  4:41 PM  Result Value Ref Range   Glucose-Capillary 130 (H) 70 - 99 mg/dL    Comment: Glucose reference range applies only to samples taken after fasting for at least 8 hours.  Glucose, capillary     Status: Abnormal   Collection Time: 04/17/24  9:44 PM  Result Value Ref Range   Glucose-Capillary 145 (H) 70 - 99 mg/dL    Comment:  Glucose reference range applies only to samples taken after fasting for at least 8 hours.  CBC     Status: Abnormal   Collection Time: 04/18/24  3:37 AM  Result Value Ref Range  WBC 14.4 (H) 4.0 - 10.5 K/uL   RBC 2.51 (L) 3.87 - 5.11 MIL/uL   Hemoglobin 7.4 (L) 12.0 - 15.0 g/dL   HCT 76.4 (L) 63.9 - 53.9 %   MCV 93.6 80.0 - 100.0 fL   MCH 29.5 26.0 - 34.0 pg   MCHC 31.5 30.0 - 36.0 g/dL   RDW 83.4 (H) 88.4 - 84.4 %   Platelets 282 150 - 400 K/uL   nRBC 0.0 0.0 - 0.2 %    Comment: Performed at [facility], [address], [place], [place] [address]  Glucose, capillary     Status: None   Collection Time: 04/18/24  6:50 AM  Result Value Ref Range   Glucose-Capillary 95 70 - 99 mg/dL    Comment: Glucose reference range applies only to samples taken after fasting for at least 8 hours.   *Note: Due to a large number of results and/or encounters for the requested time period, some results have not been displayed. A complete set of results can be found in Results Review.    Imaging / Studies: DG Sinus/Fist Tube Chk-Non GI Result Date: 04/17/2024 INDICATION: Previous small-bowel obstructions status post laparotomy and partial small-bowel resection, postop abscess is post drain catheter placement x2. Recent CT shows resolution of extraluminal fluid collections. No further output from drain catheters. Removal requested. EXAM: INJECTION OF LEFT UPPER QUADRANT DRAIN CATHETER UNDER FLUOROSCOPY INJECTION OF LEFT LOWER QUADRANT DRAIN CATHETER UNDER FLUOROSCOPY MEDICATIONS: no periprocedural antibiotics were indicated ANESTHESIA/SEDATION: None required PROCEDURE: Scout radiograph obtained. Contrast injection through the left lower quadrant catheter fills a small extraluminal collection. With aspiration, the contrast is removed. In similar fashion, contrast injection of the left upper quadrant catheter fills a small lateral irregular collection in tracks back along the catheter tubing.  With aspiration, most of the contrast is removed. No intraluminal contrast extension is noted from either injection. FLUOROSCOPY TIME:  Radiation Exposure Index (as provided by the fluoroscopic device): 22.5 mGy air Kerma COMPLICATIONS: None immediate. FINDINGS: Resolution of left abdominal abscesses post drain catheter placement. No evidence of fistula to bowel. The drain catheters were cut and removed. IMPRESSION: 1. Resolution of left abdominal abscesses post drain catheter placement. 2. No fistula to bowel. 3. Removal of drain catheters. Electronically Signed   By: JONETTA Faes M.D.   On: 04/17/2024 15:59   US  EKG SITE RITE Result Date: 04/16/2024 If Site Rite image not attached, placement could not be confirmed due to current cardiac rhythm.   Medications / Allergies: per chart  Antibiotics: Anti-infectives (From admission, onward)    Start     Dose/Rate Route Frequency Ordered Stop   04/16/24 1800  meropenem  (MERREM ) 1 g in sodium chloride  0.9 % 100 mL IVPB        1 g 200 mL/hr over 30 Minutes Intravenous Every 12 hours 04/16/24 1056     04/15/24 1400  meropenem  (MERREM ) 1 g in sodium chloride  0.9 % 100 mL IVPB  Status:  Discontinued        1 g 200 mL/hr over 30 Minutes Intravenous Every 8 hours 04/15/24 0915 04/16/24 1056   04/06/24 1300  metroNIDAZOLE  (FLAGYL ) IVPB 500 mg  Status:  Discontinued        500 mg 100 mL/hr over 60 Minutes Intravenous Every 12 hours 04/06/24 1217 04/15/24 0915   04/03/24 1330  metroNIDAZOLE  (FLAGYL ) IVPB 500 mg        500 mg 100 mL/hr over 60 Minutes Intravenous On call to O.R.  04/03/24 1242 04/03/24 1507   03/30/24 2200  vancomycin  (VANCOREADY) IVPB 750 mg/150 mL  Status:  Discontinued        750 mg 150 mL/hr over 60 Minutes Intravenous Every 24 hours 03/29/24 1620 04/03/24 1310   03/30/24 0400  ceFEPIme  (MAXIPIME ) 2 g in sodium chloride  0.9 % 100 mL IVPB  Status:  Discontinued        2 g 200 mL/hr over 30 Minutes Intravenous Every 12 hours 03/29/24  1620 04/15/24 0915   03/29/24 1530  vancomycin  (VANCOCIN ) IVPB 1000 mg/200 mL premix        1,000 mg 200 mL/hr over 60 Minutes Intravenous  Once 03/29/24 1517 03/30/24 0228   03/29/24 1530  ceFEPIme  (MAXIPIME ) 2 g in sodium chloride  0.9 % 100 mL IVPB        2 g 200 mL/hr over 30 Minutes Intravenous  Once 03/29/24 1518 03/29/24 1724         Note: Portions of this report may have been transcribed using voice recognition software. Every effort was made to ensure accuracy; however, inadvertent computerized transcription errors may be present.   Any transcriptional errors that result from this process are unintentional.    Elspeth KYM Schultze, MD, FACS, MASCRS Esophageal, Gastrointestinal & Colorectal Surgery Robotic and Minimally Invasive Surgery  [facility] [address], [address] [place], [place] [address] [phone] Fax [phone] Main  CONTACT INFORMATION: Weekday (9AM-5PM): Call [facility] main office at [phone] Weeknight (5PM-9AM) or Weekend/Holiday: Check EPIC Web Links tab & use AMION (password  [email or web address]) for General Surgery [facility] coverage  Please, DO NOT use SecureChat  (it is not reliable communication to reach operating surgeons & will lead to a delay in care).   Epic staff messaging available for outpatient concerns needing 1-2 business day response.      04/18/2024  9:11 AM

## 2024-04-19 DIAGNOSIS — J189 Pneumonia, unspecified organism: Secondary | ICD-10-CM | POA: Diagnosis not present

## 2024-04-19 DIAGNOSIS — J918 Pleural effusion in other conditions classified elsewhere: Secondary | ICD-10-CM | POA: Diagnosis not present

## 2024-04-19 LAB — CBC
HCT: 25.6 % — ABNORMAL LOW (ref 36.0–46.0)
Hemoglobin: 8.2 g/dL — ABNORMAL LOW (ref 12.0–15.0)
MCH: 30 pg (ref 26.0–34.0)
MCHC: 32 g/dL (ref 30.0–36.0)
MCV: 93.8 fL (ref 80.0–100.0)
Platelets: 301 K/uL (ref 150–400)
RBC: 2.73 MIL/uL — ABNORMAL LOW (ref 3.87–5.11)
RDW: 17.3 % — ABNORMAL HIGH (ref 11.5–15.5)
WBC: 10.7 K/uL — ABNORMAL HIGH (ref 4.0–10.5)
nRBC: 0.2 % (ref 0.0–0.2)

## 2024-04-19 LAB — BASIC METABOLIC PANEL WITH GFR
Anion gap: 8 (ref 5–15)
BUN: 13 mg/dL (ref 8–23)
CO2: 27 mmol/L (ref 22–32)
Calcium: 8.7 mg/dL — ABNORMAL LOW (ref 8.9–10.3)
Chloride: 100 mmol/L (ref 98–111)
Creatinine, Ser: 0.58 mg/dL (ref 0.44–1.00)
GFR, Estimated: >60 mL/min (ref >60)
Glucose, Bld: 78 mg/dL (ref 70–99)
Potassium: 3.9 mmol/L (ref 3.5–5.1)
Sodium: 134 mmol/L — ABNORMAL LOW (ref 135–145)

## 2024-04-19 LAB — GLUCOSE, CAPILLARY: Glucose-Capillary: 75 mg/dL (ref 70–99)

## 2024-04-19 MED ORDER — OXYCODONE-ACETAMINOPHEN 5-325 MG PO TABS
1.0000 | ORAL_TABLET | Freq: Four times a day (QID) | ORAL | Status: DC | PRN
  Administered 2024-04-19 – 2024-04-21 (×6): 2 via ORAL
  Administered 2024-04-22: 1 via ORAL
  Administered 2024-04-22 – 2024-04-23 (×5): 2 via ORAL
  Filled 2024-04-19 (×12): qty 2

## 2024-04-19 MED ORDER — METHOCARBAMOL 500 MG PO TABS
500.0000 mg | ORAL_TABLET | Freq: Three times a day (TID) | ORAL | Status: DC | PRN
  Administered 2024-04-21 – 2024-04-23 (×5): 500 mg via ORAL
  Filled 2024-04-19 (×5): qty 1

## 2024-04-19 MED ORDER — MORPHINE SULFATE (PF) 2 MG/ML IV SOLN
1.0000 mg | INTRAVENOUS | Status: DC | PRN
  Administered 2024-04-19 – 2024-04-21 (×4): 2 mg via INTRAVENOUS
  Filled 2024-04-19 (×5): qty 1

## 2024-04-19 MED ORDER — SODIUM CHLORIDE 0.9 % IV SOLN: INTRAVENOUS | Status: AC | PRN

## 2024-04-19 NOTE — Plan of Care (Signed)
   Problem: Health Behavior/Discharge Planning: Goal: Ability to manage health-related needs will improve Outcome: Progressing

## 2024-04-19 NOTE — Progress Notes (Signed)
 PROGRESS NOTE    Angelica [name]  Angelica [name] DOB: [birth year] DOA: 03/29/2024 PCP: Norleen Lynwood ORN, MD     Brief Narrative:  Angelica [name] is an 83yo with h/o GERD, HTN, HLD, IDA, asthma, stage 3b adenocarcinoma of the lung, hypothyroidism, CVA, Alzheimer's dementia, and hx of SBO due to adhesions in 2016 and 2021 who presented on 11/9 with weakness and breast swelling. She was noted to have leukocytosis with lactate of 2.1.  CXR with near-complete opacification of the left lung, differential includes large malignant pleural effusion, central hilar mass with obstruction, lobar atelectasis, and/or pneumonia.  CT A/P with large highly located left pleural effusion with compressive atelectasis throughout the left lung.  She was started on antibiotics, pulmonary consulted.  11/11 also found to have SBO, NG tube placed, surgery consulted.  Underwent lysis of adhesions with small bowel resection on 11/14.  On TPN.    New events last 24 hours / Subjective: Using commode this morning.  P.o. intake has improved.  Daughter at bedside also asking about patient's chronic left breast lymphedema following radiation therapy for her lung cancer.  Daughter asking for patient to stay in the hospital until she completes her IV antibiotic therapy 12/3 and to remove PICC line prior to discharge  Assessment & Plan:  Principal Problem:   Pleural effusion associated with pulmonary infection vs. recurrent malignancy and sepsis Active Problems:   Weakness   Adenocarcinoma of left lung, stage 3 (HCC)   HTN (hypertension)   Elevated troponin   HLD (hyperlipidemia)   History of stroke   Hypothyroidism   Asthma, severe persistent, well-controlled (HCC)   Alzheimer's disease (HCC)   GERD (gastroesophageal reflux disease)   Constipation   SBO (small bowel obstruction) (HCC)   Malnutrition of moderate degree   Hypokalemia   Sepsis (HCC)   Prediabetes   Sinus bradycardia   Electrolyte abnormality   Pressure  injury of sacral region, stage 2 (HCC)   Acute urinary retention   Pleural effusion - Insetting of pulmonary infection versus recurrent malignancy - Chest tube placed 11/12, removed 11/15 - Fluid studies suggest possible infectious etiology due to elevated WBC count, elevated LDH, elevated protein fluid but culture negative.  Cytology negative - Appreciate PCCM, now signed off  SBO s/p ex lap with small bowel resection on 11/14 - Post operative abdominal fluid collections s/p LUQ and LLQ fluid collection drains placed on 11/21 by Dr Jenna.  Drain has been removed 11/28 - Advanced to soft diet.  Now off TPN.  General surgery following - Abscess culture showing achromobacter. Discussed with pharmacy. Changed vancomycin /cefepime  --> Merrem  through 12/3. Susceptibility pending, sent out to [facility]  RUE DVT - Full dose lovenox    Demand ischemia - Stable  Hyperlipidemia - Pravachol    History of stroke - Plavix  currently on hold  History of adenocarcinoma of left lung, stage III - Diagnosed in 2022 and received chemoradiation at that time  Alzheimer's disease  GERD - Protonix   Prediabetes - A1c 5.9 - Sliding scale insulin   Hypothyroidism - Synthroid    Generalized weakness, deconditioning - PT OT - SNF recommended  Acute urinary retention - Flomax  - Remove Foley catheter 11/29  Pressure injury sacral region stage II, not present on admission   DVT prophylaxis: Lovenox  Code Status: Full Family Communication: Daughter at bedside  Disposition Plan: SNF Status is: Inpatient Remains inpatient appropriate because: IV antibiotics through 12/3.  Hopeful DC to SNF soon   Antimicrobials:  Anti-infectives (From admission, onward)  Start     Dose/Rate Route Frequency Ordered Stop   04/16/24 1800  meropenem  (MERREM ) 1 g in sodium chloride  0.9 % 100 mL IVPB        1 g 200 mL/hr over 30 Minutes Intravenous Every 12 hours 04/16/24 1056 04/22/24 2359   04/15/24 1400   meropenem  (MERREM ) 1 g in sodium chloride  0.9 % 100 mL IVPB  Status:  Discontinued        1 g 200 mL/hr over 30 Minutes Intravenous Every 8 hours 04/15/24 0915 04/16/24 1056   04/06/24 1300  metroNIDAZOLE  (FLAGYL ) IVPB 500 mg  Status:  Discontinued        500 mg 100 mL/hr over 60 Minutes Intravenous Every 12 hours 04/06/24 1217 04/15/24 0915   04/03/24 1330  metroNIDAZOLE  (FLAGYL ) IVPB 500 mg        500 mg 100 mL/hr over 60 Minutes Intravenous On call to O.R. 04/03/24 1242 04/03/24 1507   03/30/24 2200  vancomycin  (VANCOREADY) IVPB 750 mg/150 mL  Status:  Discontinued        750 mg 150 mL/hr over 60 Minutes Intravenous Every 24 hours 03/29/24 1620 04/03/24 1310   03/30/24 0400  ceFEPIme  (MAXIPIME ) 2 g in sodium chloride  0.9 % 100 mL IVPB  Status:  Discontinued        2 g 200 mL/hr over 30 Minutes Intravenous Every 12 hours 03/29/24 1620 04/15/24 0915   03/29/24 1530  vancomycin  (VANCOCIN ) IVPB 1000 mg/200 mL premix        1,000 mg 200 mL/hr over 60 Minutes Intravenous  Once 03/29/24 1517 03/30/24 0228   03/29/24 1530  ceFEPIme  (MAXIPIME ) 2 g in sodium chloride  0.9 % 100 mL IVPB        2 g 200 mL/hr over 30 Minutes Intravenous  Once 03/29/24 1518 03/29/24 1724        Objective: Vitals:   04/18/24 0748 04/18/24 1319 04/18/24 2113 04/19/24 0558  BP:  130/66 110/65 122/70  Pulse:  81 79 79  Resp:  17 18 16   Temp:  98.2 F (36.8 C) (!) 97.4 F (36.3 C) (!) 97.5 F (36.4 C)  TempSrc:    Oral  SpO2: 96% 100% 99% 100%  Weight:      Height:        Intake/Output Summary (Last 24 hours) at 04/19/2024 1037 Last data filed at 04/19/2024 1000 Gross per 24 hour  Intake 735 ml  Output 901 ml  Net -166 ml   Filed Weights   04/03/24 1327 04/07/24 0400 04/09/24 0524  Weight: 59.6 kg 58.8 kg 62.7 kg    Examination:  General exam: Appears calm  Data Reviewed: I have personally reviewed following labs and imaging studies  CBC: Recent Labs  Lab 04/15/24 1508 04/16/24 1438  04/17/24 0337 04/18/24 0337 04/19/24 0306  WBC 24.4* 21.0* 19.4* 14.4* 10.7*  NEUTROABS 21.5*  --   --   --   --   HGB 8.6* 7.5* 7.3* 7.4* 8.2*  HCT 26.4* 22.8* 22.3* 23.5* 25.6*  MCV 92.6 91.9 92.9 93.6 93.8  PLT 272 268 278 282 301   Basic Metabolic Panel: Recent Labs  Lab 04/13/24 0245 04/14/24 0401 04/16/24 0327 04/19/24 0306  NA 136 136 134* 134*  K 3.9 4.2 4.3 3.9  CL 103 103 100 100  CO2 26 27 29 27   GLUCOSE 137* 133* 111* 78  BUN 17 20 23 13   CREATININE 0.55 0.52 0.56 0.58  CALCIUM  8.2* 8.1* 8.2* 8.7*  MG 1.9  1.8 2.0  --   PHOS 2.5 2.5 2.5  --    GFR: Estimated Creatinine Clearance: 47.1 mL/min (by C-G formula based on SCr of 0.58 mg/dL). Liver Function Tests: Recent Labs  Lab 04/13/24 0245 04/16/24 0327  AST 15 17  ALT 6 7  ALKPHOS 51 51  BILITOT 0.3 <0.2  PROT 5.7* 5.5*  ALBUMIN  2.3* 2.2*   No results for input(s): LIPASE, AMYLASE in the last 168 hours. No results for input(s): AMMONIA in the last 168 hours. Coagulation Profile: No results for input(s): INR, PROTIME in the last 168 hours. Cardiac Enzymes: No results for input(s): CKTOTAL, CKMB, CKMBINDEX, TROPONINI in the last 168 hours. BNP (last 3 results) No results for input(s): PROBNP in the last 8760 hours. HbA1C: No results for input(s): HGBA1C in the last 72 hours. CBG: Recent Labs  Lab 04/18/24 0650 04/18/24 1118 04/18/24 1701 04/18/24 2109 04/19/24 0740  GLUCAP 95 104* 134* 86 75   Lipid Profile: No results for input(s): CHOL, HDL, LDLCALC, TRIG, CHOLHDL, LDLDIRECT in the last 72 hours.  Thyroid  Function Tests: No results for input(s): TSH, T4TOTAL, FREET4, T3FREE, THYROIDAB in the last 72 hours. Anemia Panel: No results for input(s): VITAMINB12, FOLATE, FERRITIN, TIBC, IRON , RETICCTPCT in the last 72 hours. Sepsis Labs: No results for input(s): PROCALCITON, LATICACIDVEN in the last 168 hours.  Recent Results (from  the past 240 hours)  Aerobic/Anaerobic Culture w Gram Stain (surgical/deep wound)     Status: None   Collection Time: 04/10/24  1:52 PM   Specimen: Abscess  Result Value Ref Range Status   Specimen Description   Final    ABSCESS Performed at [facility], [address] [place], [place] [address]    Special Requests   Final     drain 2 Performed at [facility], [address] [place], [place] [address]    Gram Stain   Final    FEW WBC PRESENT, PREDOMINANTLY PMN NO ORGANISMS SEEN    Culture   Final    No growth aerobically or anaerobically. Performed at  [facility], [address] [place], [place] [address]    Report Status 04/15/2024 FINAL  Final  Aerobic/Anaerobic Culture w Gram Stain (surgical/deep wound)     Status: None (Preliminary result)   Collection Time: 04/10/24  1:52 PM   Specimen: Abscess  Result Value Ref Range Status   Specimen Description   Final    ABSCESS Performed at [facility], [address] [place], [place] [address]    Special Requests   Final    DRAIN 1 Performed at [facility], [address], [place], [place] [address]    Gram Stain   Final    MODERATE WBC PRESENT, PREDOMINANTLY PMN NO ORGANISMS SEEN    Culture   Final    RARE ACHROMOBACTER SPECIES Sent to [facility] for further susceptibility testing. NO ANAEROBES ISOLATED Performed at [facility], [address] [place], [place] [address]    Report Status PENDING  Incomplete      Radiology Studies: DG Sinus/Fist Tube Chk-Non GI Result Date: 04/17/2024 INDICATION: Previous small-bowel obstructions status post laparotomy and partial small-bowel resection, postop abscess is post drain catheter placement x2. Recent CT shows resolution of extraluminal fluid collections. No further output from drain catheters. Removal requested. EXAM: INJECTION OF LEFT UPPER QUADRANT DRAIN CATHETER UNDER FLUOROSCOPY  INJECTION OF LEFT LOWER QUADRANT DRAIN CATHETER UNDER FLUOROSCOPY MEDICATIONS: no periprocedural antibiotics were  indicated ANESTHESIA/SEDATION: None required PROCEDURE: Scout radiograph obtained. Contrast injection through the left lower quadrant catheter fills a small extraluminal collection. With aspiration, the contrast is removed. In similar fashion, contrast injection of the left upper quadrant catheter fills a small lateral irregular collection in tracks back along the catheter tubing. With aspiration, most of the contrast is removed. No intraluminal contrast extension is noted from either injection. FLUOROSCOPY TIME:  Radiation Exposure Index (as provided by the fluoroscopic device): 22.5 mGy air Kerma COMPLICATIONS: None immediate. FINDINGS: Resolution of left abdominal abscesses post drain catheter placement. No evidence of fistula to bowel. The drain catheters were cut and removed. IMPRESSION: 1. Resolution of left abdominal abscesses post drain catheter placement. 2. No fistula to bowel. 3. Removal of drain catheters. Electronically Signed   By: JONETTA Faes M.D.   On: 04/17/2024 15:59      Scheduled Meds:  acetaminophen   1,000 mg Oral Q8H   budesonide  (PULMICORT ) nebulizer solution  0.5 mg Nebulization BID   Chlorhexidine  Gluconate Cloth  6 each Topical Q0600   enoxaparin  (LOVENOX ) injection  60 mg Subcutaneous Q12H   feeding supplement ([employer] E[employer] 1.4)  325 mL Oral BID BM   fluticasone   1 spray Each Nare Daily   hydrocortisone  cream   Topical BID   ipratropium  2 spray Each Nare TID   levothyroxine   50 mcg Oral QAC breakfast   methocarbamol  (ROBAXIN ) injection  500 mg Intravenous Q8H   montelukast   10 mg Oral QHS   pantoprazole   40 mg Oral BID   polycarbophil  625 mg Oral BID   pravastatin   80 mg Oral Daily   sodium chloride  flush  10-40 mL Intracatheter Q12H   sodium chloride  flush  5 mL Intracatheter Q8H   tamsulosin   0.4 mg Oral Daily   Continuous Infusions:   meropenem  (MERREM ) IV 1 g (04/19/24 0607)     LOS: 21 days   Time spent: 25 minutes   Delon Hoe, DO [facility] 04/19/2024, 10:37 AM   Available via Epic secure chat 7am-7pm After these hours, please refer to coverage provider listed on [email or web address]

## 2024-04-19 NOTE — Plan of Care (Signed)
   Problem: Clinical Measurements: Goal: Diagnostic test results will improve Outcome: Progressing

## 2024-04-19 NOTE — Progress Notes (Signed)
 Pt seen on routine rounds for line care; pt has DL PICC in her RUA; edema and tightness remains about the same as compared to 04-18-24;  good blood return from red port, and it flushes easily; IVF at [facility] to purple port;  arm warm to touch, elevated slightly.  RN at bedside during site evaluation.

## 2024-04-19 NOTE — Progress Notes (Signed)
 16 Days Post-Op   Subjective/Chief Complaint: No acute events over night Had another bm Tolerating po   Objective: Vital signs in last 24 hours: Temp:  [97.4 F (36.3 C)-98.2 F (36.8 C)] 97.5 F (36.4 C) (11/30 0558) Pulse Rate:  [79-81] 79 (11/30 0558) Resp:  [16-18] 16 (11/30 0558) BP: (110-130)/(65-70) 122/70 (11/30 0558) SpO2:  [99 %-100 %] 100 % (11/30 0558) Last BM Date : 04/18/24  Intake/Output from previous day: 11/29 0701 - 11/30 0700 In: 810 [P.O.:475; IV Piggyback:335] Out: 1100 [Urine:1100] Intake/Output this shift: No intake/output data recorded.  Exam: Awake and alert Baseline confusion Looks comfortable Abdomen soft, midline wound packing removed, wound granulating well with some exudate Drains out  Lab Results:  Recent Labs    04/18/24 0337 04/19/24 0306  WBC 14.4* 10.7*  HGB 7.4* 8.2*  HCT 23.5* 25.6*  PLT 282 301   BMET Recent Labs    04/19/24 0306  NA 134*  K 3.9  CL 100  CO2 27  GLUCOSE 78  BUN 13  CREATININE 0.58  CALCIUM  8.7*   PT/INR No results for input(s): LABPROT, INR in the last 72 hours. ABG No results for input(s): PHART, HCO3 in the last 72 hours.  Invalid input(s): PCO2, PO2  Studies/Results: DG Sinus/Fist Tube Chk-Non GI Result Date: 04/17/2024 INDICATION: Previous small-bowel obstructions status post laparotomy and partial small-bowel resection, postop abscess is post drain catheter placement x2. Recent CT shows resolution of extraluminal fluid collections. No further output from drain catheters. Removal requested. EXAM: INJECTION OF LEFT UPPER QUADRANT DRAIN CATHETER UNDER FLUOROSCOPY INJECTION OF LEFT LOWER QUADRANT DRAIN CATHETER UNDER FLUOROSCOPY MEDICATIONS: no periprocedural antibiotics were indicated ANESTHESIA/SEDATION: None required PROCEDURE: Scout radiograph obtained. Contrast injection through the left lower quadrant catheter fills a small extraluminal collection. With aspiration, the contrast  is removed. In similar fashion, contrast injection of the left upper quadrant catheter fills a small lateral irregular collection in tracks back along the catheter tubing. With aspiration, most of the contrast is removed. No intraluminal contrast extension is noted from either injection. FLUOROSCOPY TIME:  Radiation Exposure Index (as provided by the fluoroscopic device): 22.5 mGy air Kerma COMPLICATIONS: None immediate. FINDINGS: Resolution of left abdominal abscesses post drain catheter placement. No evidence of fistula to bowel. The drain catheters were cut and removed. IMPRESSION: 1. Resolution of left abdominal abscesses post drain catheter placement. 2. No fistula to bowel. 3. Removal of drain catheters. Electronically Signed   By: JONETTA Faes M.D.   On: 04/17/2024 15:59    Anti-infectives: Anti-infectives (From admission, onward)    Start     Dose/Rate Route Frequency Ordered Stop   04/16/24 1800  meropenem  (MERREM ) 1 g in sodium chloride  0.9 % 100 mL IVPB        1 g 200 mL/hr over 30 Minutes Intravenous Every 12 hours 04/16/24 1056 04/22/24 2359   04/15/24 1400  meropenem  (MERREM ) 1 g in sodium chloride  0.9 % 100 mL IVPB  Status:  Discontinued        1 g 200 mL/hr over 30 Minutes Intravenous Every 8 hours 04/15/24 0915 04/16/24 1056   04/06/24 1300  metroNIDAZOLE  (FLAGYL ) IVPB 500 mg  Status:  Discontinued        500 mg 100 mL/hr over 60 Minutes Intravenous Every 12 hours 04/06/24 1217 04/15/24 0915   04/03/24 1330  metroNIDAZOLE  (FLAGYL ) IVPB 500 mg        500 mg 100 mL/hr over 60 Minutes Intravenous On call to O.R. 04/03/24 1242  04/03/24 1507   03/30/24 2200  vancomycin  (VANCOREADY) IVPB 750 mg/150 mL  Status:  Discontinued        750 mg 150 mL/hr over 60 Minutes Intravenous Every 24 hours 03/29/24 1620 04/03/24 1310   03/30/24 0400  ceFEPIme  (MAXIPIME ) 2 g in sodium chloride  0.9 % 100 mL IVPB  Status:  Discontinued        2 g 200 mL/hr over 30 Minutes Intravenous Every 12 hours  03/29/24 1620 04/15/24 0915   03/29/24 1530  vancomycin  (VANCOCIN ) IVPB 1000 mg/200 mL premix        1,000 mg 200 mL/hr over 60 Minutes Intravenous  Once 03/29/24 1517 03/30/24 0228   03/29/24 1530  ceFEPIme  (MAXIPIME ) 2 g in sodium chloride  0.9 % 100 mL IVPB        2 g 200 mL/hr over 30 Minutes Intravenous  Once 03/29/24 1518 03/29/24 1724       Assessment/Plan: POD 16 s/p ex lap with SBR, Dr. Teresa 11/14   -wbc down further.  Drains removed bu IR -tolerating po -continue dressing changes to mid line wound. -we will check the midline wound later this week  Vicenta Poli 04/19/2024

## 2024-04-20 LAB — CBC
HCT: 24.1 % — ABNORMAL LOW (ref 36.0–46.0)
Hemoglobin: 7.7 g/dL — ABNORMAL LOW (ref 12.0–15.0)
MCH: 29.6 pg (ref 26.0–34.0)
MCHC: 32 g/dL (ref 30.0–36.0)
MCV: 92.7 fL (ref 80.0–100.0)
Platelets: 302 K/uL (ref 150–400)
RBC: 2.6 MIL/uL — ABNORMAL LOW (ref 3.87–5.11)
RDW: 17.7 % — ABNORMAL HIGH (ref 11.5–15.5)
WBC: 8.6 K/uL (ref 4.0–10.5)
nRBC: 0.7 % — ABNORMAL HIGH (ref 0.0–0.2)

## 2024-04-20 NOTE — Plan of Care (Signed)
  Problem: Skin Integrity: Goal: Risk for impaired skin integrity will decrease Outcome: Progressing   Problem: Fluid Volume: Goal: Ability to maintain a balanced intake and output will improve Outcome: Progressing   Problem: Health Behavior/Discharge Planning: Goal: Ability to identify and utilize available resources and services will improve Outcome: Progressing   Problem: Metabolic: Goal: Ability to maintain appropriate glucose levels will improve Outcome: Progressing   Problem: Tissue Perfusion: Goal: Adequacy of tissue perfusion will improve Outcome: Progressing

## 2024-04-20 NOTE — Care Plan (Signed)
     Reviewed chart and discussed with attending provider. Goals are clear. No PMT needs at this time. PMT will sign-off. Please re-consult if needs arise.  Stephane Palin, NP Palliative Medicine Team  Team Phone # [phone]   NO CHARGE

## 2024-04-20 NOTE — Plan of Care (Signed)
   Problem: Coping: Goal: Level of anxiety will decrease Outcome: Progressing

## 2024-04-20 NOTE — TOC Progression Note (Signed)
 Transition of Care [facility]) - Progression Note    Patient Details  Name: Angelica [name] MRN: [id number] Date of Birth: 1941-03-25  [facility] of [name]) CM/SW Contact  Tawni CHRISTELLA Eva, LCSW Phone Number: 04/20/2024, 10:37 AM  Clinical Narrative:     The pt's insurance authorization expires 12/3. According to the MD, the pt is not medically stable at this time. A new insurance authorization may need to be initiated once the pt reaches medical stability. ICM will follow.    Expected Discharge Plan: Skilled Nursing Facility Barriers to Discharge: Continued Medical Work up               Expected Discharge Plan and Services In-house Referral: Clinical Social Work   Post Acute Care Choice: Home Health Living arrangements for the past 2 months: Single Family Home                 DME Arranged: N/A DME Agency: NA                   Social Drivers of Health (SDOH) Interventions SDOH Screenings   Food Insecurity: No Food Insecurity (03/29/2024)  Housing: Low Risk  (03/29/2024)  Transportation Needs: No Transportation Needs (03/29/2024)  Utilities: Not At Risk (03/29/2024)  Alcohol Screen: Low Risk  (08/20/2023)  Depression (PHQ2-9): Low Risk  (12/17/2023)  Financial Resource Strain: Low Risk  (12/15/2023)  Physical Activity: Inactive (12/15/2023)  Social Connections: Moderately Integrated (12/15/2023)  Stress: No Stress Concern Present (12/15/2023)  Tobacco Use: Medium Risk (04/03/2024)  Health Literacy: Adequate Health Literacy (08/20/2023)    Readmission Risk Interventions    10/08/2022    2:08 PM  Readmission Risk Prevention Plan  Transportation Screening Complete  PCP or Specialist Appt within 5-7 Days Complete  Home Care Screening Complete  Medication Review (RN CM) Complete

## 2024-04-20 NOTE — Progress Notes (Signed)
 Wound dressing on abdomen changed

## 2024-04-20 NOTE — Progress Notes (Signed)
 Nutrition Follow-up  DOCUMENTATION CODES:   Non-severe (moderate) malnutrition in context of chronic illness  INTERVENTION:  - Regular diet.   - See results of calorie count below.  [employer] 1.4 PO BID, each supplement provides 455 kcal and 20 grams protein.  - Encourage intake at all meals and of supplements.  - Monitor weight trends.   - Last weight 11/20. Ordered new weight.   NUTRITION DIAGNOSIS:   Moderate Malnutrition related to chronic illness as evidenced by mild fat depletion, moderate muscle depletion. *ongoing  GOAL:   Patient will meet greater than or equal to 90% of their needs *progressing  MONITOR:   Diet advancement, Labs, Weight trends  REASON FOR ASSESSMENT:   Other (Comment) (New TPN)    ASSESSMENT:   83 y.o. 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 Admitted for PE and SBO.  11/9 Admit 11/11 NGT placed 11/18 TPN initiated 11/22  NGT removed; CLD 11/23 FLD; Calorie count initiated 11/24 Intralipids added to TPN, TPN advanced to goal and meeting 100% of need 11/25 Patient eating poorly, calorie count discontinued 11/27 Soft diet 11/28 TPN weaned off 11/29 Regular diet; Calorie count  Calorie count results below. Day 1 (11/29): Breakfast: 50% = 285 kcals, 8g protein Lunch: = 10% rice, 100% jello, 50% dinner roll = 130 kcals, 2 g protein Dinner: 15% green beans, 100% mashed potatoes and gravy, 10% roll, 100% diet ginger ale = 141 kcals, 3 g protein Supplements: None Total intake: 556 kcal (37% of minimum estimated needs)  13 protein (16% of minimum estimated needs)  Day 2 (11/30): Breakfast: 100% = 756 kcals, 17g protein Lunch: 60% chicken, 60% green beans, 100% roll, 50% mashed potatoes = 228 kcals, 17g  protein Dinner: 50% = 272 kcals, 5g protein  Supplements: None Total intake: 1256 kcal (84% of minimum estimated needs)  39 protein (49% of minimum estimated needs)  She is meeting an average of 60% calorie needs and 33% protein needs. Patient had 75% of breakfast today as well.  She is not accepting supplements. Unfortunately this is the only supplement she is able to get due to her intolerance of milk/whey supplements. Will continue them for now and encourage intake.   Patient moaning at time of visit. Did not interact meaningfully. No family at bedside. TPN weaned off 11/28.  Discussed results of calorie count with Hospitalist. Patient eating adequately. Will discontinue calorie count.    Admit weight: 131# Current weight: 138# (last weight from 11/20, ordered new weight) I&O's: +9.1L since 11/17  Medications reviewed and include: Protonix , Fibercon BID   Labs reviewed:  No BMP today   Diet Order:   Diet Order             Diet regular Room service appropriate? Yes; Fluid consistency: Thin  Diet effective now                   EDUCATION NEEDS:  Education needs have been addressed  Skin:  Skin Assessment: Skin Integrity Issues: Skin Integrity Issues:: Stage II Stage II: Sacrum Incisions: Surgical on abdomen  Last BM:  12/1 - type 6  Height:  Ht Readings from Last 1 Encounters:  04/03/24 5' 5 (1.651 m)   Weight:  Wt Readings from Last 1 Encounters:  04/09/24 62.7 kg   Ideal Body Weight:  56.82 kg  BMI:  Body mass index is 23 kg/m.  Estimated Nutritional Needs:  Kcal:  1500-1700 kcals Protein:  80-95 Fluid:  >/= 1.5L    Trude Ned RD, LDN Contact via Secure Chat.

## 2024-04-20 NOTE — Progress Notes (Signed)
 17 Days Post-Op   Subjective/Chief Complaint: No issues Tol Reg   Objective: Vital signs in last 24 hours: Temp:  [97.6 F (36.4 C)-98.4 F (36.9 C)] 98 F (36.7 C) (12/01 0531) Pulse Rate:  [84-93] 93 (12/01 0531) Resp:  [16-18] 17 (12/01 0531) BP: (112-127)/(61-80) 127/63 (12/01 0531) SpO2:  [95 %-100 %] 98 % (12/01 0746) Last BM Date : 04/19/24  Intake/Output from previous day: 11/30 0701 - 12/01 0700 In: 1020 [P.O.:780; NG/GT:240] Out: 701 [Urine:700; Stool:1] Intake/Output this shift: No intake/output data recorded.  Exam: Awake and alert Baseline confusion Looks comfortable Abdomen soft, midline wound packing removed, wound granulating well with some exudate Drains out  Lab Results:  Recent Labs    04/19/24 0306 04/20/24 0259  WBC 10.7* 8.6  HGB 8.2* 7.7*  HCT 25.6* 24.1*  PLT 301 302   BMET Recent Labs    04/19/24 0306  NA 134*  K 3.9  CL 100  CO2 27  GLUCOSE 78  BUN 13  CREATININE 0.58  CALCIUM  8.7*   PT/INR No results for input(s): LABPROT, INR in the last 72 hours. ABG No results for input(s): PHART, HCO3 in the last 72 hours.  Invalid input(s): PCO2, PO2  Studies/Results: No results found.  Anti-infectives: Anti-infectives (From admission, onward)    Start     Dose/Rate Route Frequency Ordered Stop   04/16/24 1800  meropenem  (MERREM ) 1 g in sodium chloride  0.9 % 100 mL IVPB        1 g 200 mL/hr over 30 Minutes Intravenous Every 12 hours 04/16/24 1056 04/22/24 2359   04/15/24 1400  meropenem  (MERREM ) 1 g in sodium chloride  0.9 % 100 mL IVPB  Status:  Discontinued        1 g 200 mL/hr over 30 Minutes Intravenous Every 8 hours 04/15/24 0915 04/16/24 1056   04/06/24 1300  metroNIDAZOLE  (FLAGYL ) IVPB 500 mg  Status:  Discontinued        500 mg 100 mL/hr over 60 Minutes Intravenous Every 12 hours 04/06/24 1217 04/15/24 0915   04/03/24 1330  metroNIDAZOLE  (FLAGYL ) IVPB 500 mg        500 mg 100 mL/hr over 60 Minutes  Intravenous On call to O.R. 04/03/24 1242 04/03/24 1507   03/30/24 2200  vancomycin  (VANCOREADY) IVPB 750 mg/150 mL  Status:  Discontinued        750 mg 150 mL/hr over 60 Minutes Intravenous Every 24 hours 03/29/24 1620 04/03/24 1310   03/30/24 0400  ceFEPIme  (MAXIPIME ) 2 g in sodium chloride  0.9 % 100 mL IVPB  Status:  Discontinued        2 g 200 mL/hr over 30 Minutes Intravenous Every 12 hours 03/29/24 1620 04/15/24 0915   03/29/24 1530  vancomycin  (VANCOCIN ) IVPB 1000 mg/200 mL premix        1,000 mg 200 mL/hr over 60 Minutes Intravenous  Once 03/29/24 1517 03/30/24 0228   03/29/24 1530  ceFEPIme  (MAXIPIME ) 2 g in sodium chloride  0.9 % 100 mL IVPB        2 g 200 mL/hr over 30 Minutes Intravenous  Once 03/29/24 1518 03/29/24 1724       Assessment/Plan: POD 17 s/p ex lap with SBR, Dr. Teresa 11/14    -wbc nml.  Drains removed bu IR -tolerating po -continue dressing changes to mid line wound. -we will check the midline wound later this week if still admitted    LOS: 22 days    Angelica [name] 04/20/2024

## 2024-04-20 NOTE — Progress Notes (Signed)
 PROGRESS NOTE    TERRYN ROSENKRANZ  FMW:[id number] DOB: 1940-05-30 DOA: 03/29/2024 PCP: Norleen Lynwood ORN, MD     Brief Narrative:  Angelica [name] is an 83yo with h/o GERD, HTN, HLD, IDA, asthma, stage 3b adenocarcinoma of the lung, hypothyroidism, CVA, Alzheimer's dementia, and hx of SBO due to adhesions in 2016 and 2021 who presented on 11/9 with weakness and breast swelling. She was noted to have leukocytosis with lactate of 2.1.  CXR with near-complete opacification of the left lung, differential includes large malignant pleural effusion, central hilar mass with obstruction, lobar atelectasis, and/or pneumonia.  CT A/P with large highly located left pleural effusion with compressive atelectasis throughout the left lung.  She was started on antibiotics, pulmonary consulted.  11/11 also found to have SBO, NG tube placed, surgery consulted.  Underwent lysis of adhesions with small bowel resection on 11/14.  On TPN.    New events last 24 hours / Subjective: Eating breakfast this morning.  No new issues overnight.   Assessment & Plan:  Principal Problem:   Pleural effusion associated with pulmonary infection vs. recurrent malignancy and sepsis Active Problems:   Weakness   Adenocarcinoma of left lung, stage 3 (HCC)   HTN (hypertension)   Elevated troponin   HLD (hyperlipidemia)   History of stroke   Hypothyroidism   Asthma, severe persistent, well-controlled (HCC)   Alzheimer's disease (HCC)   GERD (gastroesophageal reflux disease)   Constipation   SBO (small bowel obstruction) (HCC)   Malnutrition of moderate degree   Hypokalemia   Sepsis (HCC)   Prediabetes   Sinus bradycardia   Electrolyte abnormality   Pressure injury of sacral region, stage 2 (HCC)   Acute urinary retention   Pleural effusion - Insetting of pulmonary infection versus recurrent malignancy - Chest tube placed 11/12, removed 11/15 - Fluid studies suggest possible infectious etiology due to elevated WBC count,  elevated LDH, elevated protein fluid but culture negative.  Cytology negative - Appreciate PCCM, now signed off  SBO s/p ex lap with small bowel resection on 11/14 - Post operative abdominal fluid collections s/p LUQ and LLQ fluid collection drains placed on 11/21 by Dr Jenna.  Drain has been removed 11/28 - Advanced to soft diet.  Now off TPN.  General surgery following - Abscess culture showing achromobacter. Discussed with pharmacy. Changed vancomycin /cefepime  --> Merrem  through 12/3. Susceptibility pending, sent out to [facility]  RUE DVT - Full dose lovenox    Demand ischemia - Stable  Hyperlipidemia - Pravachol    History of stroke - Plavix  currently on hold  History of adenocarcinoma of left lung, stage III - Diagnosed in 2022 and received chemoradiation at that time  Alzheimer's disease  GERD - Protonix   Prediabetes - A1c 5.9  Hypothyroidism - Synthroid    Generalized weakness, deconditioning - PT OT - SNF recommended  Acute urinary retention - Flomax  - Remove Foley catheter 11/29  Pressure injury sacral region stage II, not present on admission   DVT prophylaxis: Lovenox  Code Status: Full Family Communication: Daughter at bedside  Disposition Plan: SNF Status is: Inpatient Remains inpatient appropriate because: IV antibiotics through 12/3, PICC to be removed once IV antibiotic completed.  Hopeful DC to SNF 12/4   Antimicrobials:  Anti-infectives (From admission, onward)    Start     Dose/Rate Route Frequency Ordered Stop   04/16/24 1800  meropenem  (MERREM ) 1 g in sodium chloride  0.9 % 100 mL IVPB        1 g 200 mL/hr over  30 Minutes Intravenous Every 12 hours 04/16/24 1056 04/22/24 2359   04/15/24 1400  meropenem  (MERREM ) 1 g in sodium chloride  0.9 % 100 mL IVPB  Status:  Discontinued        1 g 200 mL/hr over 30 Minutes Intravenous Every 8 hours 04/15/24 0915 04/16/24 1056   04/06/24 1300  metroNIDAZOLE  (FLAGYL ) IVPB 500 mg  Status:  Discontinued         500 mg 100 mL/hr over 60 Minutes Intravenous Every 12 hours 04/06/24 1217 04/15/24 0915   04/03/24 1330  metroNIDAZOLE  (FLAGYL ) IVPB 500 mg        500 mg 100 mL/hr over 60 Minutes Intravenous On call to O.R. 04/03/24 1242 04/03/24 1507   03/30/24 2200  vancomycin  (VANCOREADY) IVPB 750 mg/150 mL  Status:  Discontinued        750 mg 150 mL/hr over 60 Minutes Intravenous Every 24 hours 03/29/24 1620 04/03/24 1310   03/30/24 0400  ceFEPIme  (MAXIPIME ) 2 g in sodium chloride  0.9 % 100 mL IVPB  Status:  Discontinued        2 g 200 mL/hr over 30 Minutes Intravenous Every 12 hours 03/29/24 1620 04/15/24 0915   03/29/24 1530  vancomycin  (VANCOCIN ) IVPB 1000 mg/200 mL premix        1,000 mg 200 mL/hr over 60 Minutes Intravenous  Once 03/29/24 1517 03/30/24 0228   03/29/24 1530  ceFEPIme  (MAXIPIME ) 2 g in sodium chloride  0.9 % 100 mL IVPB        2 g 200 mL/hr over 30 Minutes Intravenous  Once 03/29/24 1518 03/29/24 1724        Objective: Vitals:   04/19/24 2109 04/19/24 2145 04/20/24 0531 04/20/24 0746  BP:  114/80 127/63   Pulse:  93 93   Resp:  18 17   Temp:  98.4 F (36.9 C) 98 F (36.7 C)   TempSrc:  Oral Oral   SpO2: 95% 100% 98% 98%  Weight:      Height:        Intake/Output Summary (Last 24 hours) at 04/20/2024 1034 Last data filed at 04/20/2024 0600 Gross per 24 hour  Intake 780 ml  Output 700 ml  Net 80 ml   Filed Weights   04/03/24 1327 04/07/24 0400 04/09/24 0524  Weight: 59.6 kg 58.8 kg 62.7 kg   Examination: General exam: Appears calm and comfortable  Respiratory system: Clear to auscultation. Respiratory effort normal. Cardiovascular system: S1 & S2 heard, RRR. No pedal edema. Gastrointestinal system: Abdomen is nondistended, soft and nontender Central nervous system: Alert  Extremities: Right forearm with dependent edema Skin: Left breast with minimal to no lymphedema  Data Reviewed: I have personally reviewed following labs and imaging  studies  CBC: Recent Labs  Lab 04/15/24 1508 04/16/24 1438 04/17/24 0337 04/18/24 0337 04/19/24 0306 04/20/24 0259  WBC 24.4* 21.0* 19.4* 14.4* 10.7* 8.6  NEUTROABS 21.5*  --   --   --   --   --   HGB 8.6* 7.5* 7.3* 7.4* 8.2* 7.7*  HCT 26.4* 22.8* 22.3* 23.5* 25.6* 24.1*  MCV 92.6 91.9 92.9 93.6 93.8 92.7  PLT 272 268 278 282 301 302   Basic Metabolic Panel: Recent Labs  Lab 04/14/24 0401 04/16/24 0327 04/19/24 0306  NA 136 134* 134*  K 4.2 4.3 3.9  CL 103 100 100  CO2 27 29 27   GLUCOSE 133* 111* 78  BUN 20 23 13   CREATININE 0.52 0.56 0.58  CALCIUM  8.1* 8.2* 8.7*  MG 1.8 2.0  --   PHOS 2.5 2.5  --    GFR: Estimated Creatinine Clearance: 47.1 mL/min (by C-G formula based on SCr of 0.58 mg/dL). Liver Function Tests: Recent Labs  Lab 04/16/24 0327  AST 17  ALT 7  ALKPHOS 51  BILITOT <0.2  PROT 5.5*  ALBUMIN  2.2*   No results for input(s): LIPASE, AMYLASE in the last 168 hours. No results for input(s): AMMONIA in the last 168 hours. Coagulation Profile: No results for input(s): INR, PROTIME in the last 168 hours. Cardiac Enzymes: No results for input(s): CKTOTAL, CKMB, CKMBINDEX, TROPONINI in the last 168 hours. BNP (last 3 results) No results for input(s): PROBNP in the last 8760 hours. HbA1C: No results for input(s): HGBA1C in the last 72 hours. CBG: Recent Labs  Lab 04/18/24 0650 04/18/24 1118 04/18/24 1701 04/18/24 2109 04/19/24 0740  GLUCAP 95 104* 134* 86 75   Lipid Profile: No results for input(s): CHOL, HDL, LDLCALC, TRIG, CHOLHDL, LDLDIRECT in the last 72 hours.  Thyroid  Function Tests: No results for input(s): TSH, T4TOTAL, FREET4, T3FREE, THYROIDAB in the last 72 hours. Anemia Panel: No results for input(s): VITAMINB12, FOLATE, FERRITIN, TIBC, IRON , RETICCTPCT in the last 72 hours. Sepsis Labs: No results for input(s): PROCALCITON, LATICACIDVEN in the last 168  hours.  Recent Results (from the past 240 hours)  Aerobic/Anaerobic Culture w Gram Stain (surgical/deep wound)     Status: None   Collection Time: 04/10/24  1:52 PM   Specimen: Abscess  Result Value Ref Range Status   Specimen Description   Final    ABSCESS Performed at [facility], [address] [place], [place] [address]    Special Requests   Final     drain 2 Performed at [facility], [address], [place], [place] [address]    Gram Stain   Final    FEW WBC PRESENT, PREDOMINANTLY PMN NO ORGANISMS SEEN    Culture   Final    No growth aerobically or anaerobically. Performed at [facility], [address] [place], [place] [address]    Report Status 04/15/2024 FINAL  Final  Aerobic/Anaerobic Culture w Gram Stain (surgical/deep wound)     Status: None (Preliminary result)   Collection Time: 04/10/24  1:52 PM   Specimen: Abscess  Result Value Ref Range Status   Specimen Description   Final    ABSCESS Performed at [facility], [address] [place], [place] [address]    Special Requests   Final    DRAIN 1 Performed at [facility], [address], [place], [place] [address]    Gram Stain   Final    MODERATE WBC PRESENT, PREDOMINANTLY PMN NO ORGANISMS SEEN    Culture   Final    RARE ACHROMOBACTER SPECIES Sent to [facility] for further susceptibility testing. NO ANAEROBES ISOLATED Performed at [facility], [address], [place], [place] [address]    Report Status PENDING  Incomplete      Radiology Studies: No results found.     Scheduled Meds:  budesonide  (PULMICORT ) nebulizer solution  0.5 mg Nebulization BID   Chlorhexidine  Gluconate Cloth  6 each Topical Q0600   enoxaparin  (LOVENOX ) injection  60 mg Subcutaneous Q12H   feeding supplement (KATE [employer] STANDARD ENT 1.4)  325 mL Oral BID BM   fluticasone   1 spray Each Nare Daily   hydrocortisone  cream   Topical BID    ipratropium  2 spray Each Nare TID   levothyroxine   50 mcg Oral QAC breakfast   montelukast   10 mg Oral QHS   pantoprazole   40 mg Oral BID   polycarbophil  625 mg Oral BID   pravastatin   80 mg Oral Daily   sodium chloride  flush  10-40 mL Intracatheter Q12H   tamsulosin   0.4 mg Oral Daily   Continuous Infusions:  sodium chloride      meropenem  (MERREM ) IV 1 g (04/20/24 0613)     LOS: 22 days   Time spent: 25 minutes   Delon Hoe, DO [facility] 04/20/2024, 10:34 AM   Available via Epic secure chat 7am-7pm After these hours, please refer to coverage provider listed on [email or web address]

## 2024-04-21 NOTE — Progress Notes (Signed)
 IV consult for potential problem of RUA PICC line d/t R arm swelling. R arm assessed and very edematous. Per Damien RN, R arm was not this big compared to right now. PICC red port flushed easily have great BR. Patient had US  on both arms 11/25 and have + DVT on R axillary vein per result. RN made aware and will notify MD.

## 2024-04-21 NOTE — Plan of Care (Signed)
  Problem: Pain Managment: Goal: General experience of comfort will improve and/or be controlled Outcome: Progressing   Problem: Coping: Goal: Ability to adjust to condition or change in health will improve Outcome: Progressing

## 2024-04-21 NOTE — Plan of Care (Signed)

## 2024-04-21 NOTE — Progress Notes (Addendum)
 Physical Therapy Treatment Patient Details Name: Angelica [name] MRN: [id number] DOB: 14-Apr-1941 Today's Date: 04/21/2024   History of Present Illness Angelica [name] is a 83 y.o. female presented to ED with complaints of weakness; admitted 11/9 with large loculated left pleural effusion.03/31/2024 diagnosed with SBO, s/p ex lap  11/14,placed NGT 04/01/24 s/p pigtail chest tube placed. PMH: GERD, HTN, HLD, IDA, asthma, stage 3b adenocarcinoma of the lung, hypothyroidism, CVA, Alzheimer's, SBO due to adhesions in 2016 and 2021    PT Comments  Pt agreeable to working with therapy. She required Mod A +2 for mobility on today. Pt reports abdominal pain during session-moaning and grimacing-made RN aware. Encouraged pt to sit up as tolerated in recliner. Will continue to follow and progress activity as safely able, and within pt's tolerance. Patient will benefit from continued inpatient follow up therapy, <3 hours/day   *Addendum--pt continuing to c/o pain while sitting up in recliner-tearful-called up to front desk for RN-RN in to assess and give pain meds. Repositioned pt in chair for comfort*   If plan is discharge home, recommend the following: A lot of help with bathing/dressing/bathroom;Assistance with cooking/housework;Assist for transportation;Help with stairs or ramp for entrance;Two people to help with walking and/or transfers;A lot of help with walking and/or transfers   Can travel by private vehicle        Equipment Recommendations  None recommended by PT    Recommendations for Other Services       Precautions / Restrictions Precautions Precautions: Fall Precaution/Restrictions Comments: abd sg Restrictions Weight Bearing Restrictions Per Provider Order: No     Mobility  Bed Mobility Overal bed mobility: Needs Assistance Bed Mobility: Rolling, Sidelying to Sit Rolling: Mod assist, +2 for physical assistance, +2 for safety/equipment, Used rails Sidelying to sit: Mod assist,  +2 for physical assistance, +2 for safety/equipment, HOB elevated, Used rails       General bed mobility comments: Repeated multimodal cueing and increased time for safety, technique. Assist for trunk and LEs.    Transfers Overall transfer level: Needs assistance Equipment used: Rolling walker (2 wheels) Transfers: Sit to/from Stand, Bed to chair/wheelchair/BSC Sit to Stand: Mod assist, +2 physical assistance, +2 safety/equipment, From elevated surface   Step pivot transfers: Min assist, +2 physical assistance, +2 safety/equipment       General transfer comment: x2 sit to stands with RW. Repeated multimodal cueing and increased time. Assist to power up, stabilize, control descent.    Ambulation/Gait Ambulation/Gait assistance: Min assist, +2 physical assistance, +2 safety/equipment Gait Distance (Feet): 3 Feet Assistive device: Rolling walker (2 wheels) Gait Pattern/deviations: Trunk flexed       General Gait Details: Pt able to step over from bed to recliner with RW. Limited by fatigue and pain.   Stairs             Wheelchair Mobility     Tilt Bed    Modified Rankin (Stroke Patients Only)       Balance Overall balance assessment: Needs assistance Sitting-balance support: Bilateral upper extremity supported, Feet supported Sitting balance-Leahy Scale: Fair     Standing balance support: Bilateral upper extremity supported, During functional activity, Reliant on assistive device for balance Standing balance-Leahy Scale: Poor                              Communication Communication Communication: Impaired Factors Affecting Communication: Difficulty expressing self  Cognition Arousal: Alert Behavior During Therapy: WFL for tasks assessed/performed  PT - Cognitive impairments: History of cognitive impairments                         Following commands: Impaired Following commands impaired: Follows one step commands with  increased time    Cueing Cueing Techniques: Verbal cues, Gestural cues, Visual cues, Tactile cues  Exercises      General Comments        Pertinent Vitals/Pain Pain Assessment Pain Assessment: Faces Faces Pain Scale: Hurts even more Pain Location: abdomen Pain Descriptors / Indicators: Grimacing, Moaning, Guarding Pain Intervention(s): Limited activity within patient's tolerance, Monitored during session, Repositioned    Home Living                          Prior Function            PT Goals (current goals can now be found in the care plan section) Progress towards PT goals: Progressing toward goals    Frequency    Min 2X/week      PT Plan      Co-evaluation              AM-PAC PT 6 Clicks Mobility   Outcome Measure  Help needed turning from your back to your side while in a flat bed without using bedrails?: A Lot Help needed moving from lying on your back to sitting on the side of a flat bed without using bedrails?: A Lot Help needed moving to and from a bed to a chair (including a wheelchair)?: A Lot Help needed standing up from a chair using your arms (e.g., wheelchair or bedside chair)?: A Lot Help needed to walk in hospital room?: A Lot Help needed climbing 3-5 steps with a railing? : Total 6 Click Score: 11    End of Session Equipment Utilized During Treatment: Gait belt Activity Tolerance: Patient limited by fatigue;Patient limited by pain Patient left: in chair;with call bell/phone within reach;with chair alarm set   PT Visit Diagnosis: Unsteadiness on feet (R26.81);Muscle weakness (generalized) (M62.81)     Time: 1045-1100 PT Time Calculation (min) (ACUTE ONLY): 15 min  Charges:    $Therapeutic Activity: 8-22 mins PT General Charges $$ ACUTE PT VISIT: 1 Visit                         Dannial SQUIBB, PT Acute Rehabilitation  Office: [phone]

## 2024-04-21 NOTE — Progress Notes (Signed)
 PROGRESS NOTE    Angelica [name]  FMW:[id number] DOB: 1940-08-14 DOA: 03/29/2024 PCP: Norleen Lynwood ORN, MD     Brief Narrative:  Angelica [name] is an 83yo with h/o GERD, HTN, HLD, IDA, asthma, stage 3b adenocarcinoma of the lung, hypothyroidism, CVA, Alzheimer's dementia, and hx of SBO due to adhesions in 2016 and 2021 who presented on 11/9 with weakness and breast swelling. She was noted to have leukocytosis with lactate of 2.1.  CXR with near-complete opacification of the left lung, differential includes large malignant pleural effusion, central hilar mass with obstruction, lobar atelectasis, and/or pneumonia.  CT A/P with large highly located left pleural effusion with compressive atelectasis throughout the left lung.  She was started on antibiotics, pulmonary consulted.  11/11 also found to have SBO, NG tube placed, surgery consulted.  Underwent lysis of adhesions with small bowel resection on 11/14.  Was on TPN but with improvement in PO intake, TPN now discontinued. She developed DVT RUE and started on full dose lovenox .   New events last 24 hours / Subjective: Continues to have good p.o. intake.  Assessment & Plan:  Principal Problem:   Pleural effusion associated with pulmonary infection vs. recurrent malignancy and sepsis Active Problems:   Weakness   Adenocarcinoma of left lung, stage 3 (HCC)   HTN (hypertension)   Elevated troponin   HLD (hyperlipidemia)   History of stroke   Hypothyroidism   Asthma, severe persistent, well-controlled (HCC)   Alzheimer's disease (HCC)   GERD (gastroesophageal reflux disease)   Constipation   SBO (small bowel obstruction) (HCC)   Malnutrition of moderate degree   Hypokalemia   Sepsis (HCC)   Prediabetes   Sinus bradycardia   Electrolyte abnormality   Pressure injury of sacral region, stage 2 (HCC)   Acute urinary retention   Pleural effusion - Insetting of pulmonary infection versus recurrent malignancy - Chest tube placed 11/12,  removed 11/15 - Fluid studies suggest possible infectious etiology due to elevated WBC count, elevated LDH, elevated protein fluid but culture negative.  Cytology negative - Appreciate PCCM, now signed off  SBO s/p ex lap with small bowel resection on 11/14 - Post operative abdominal fluid collections s/p LUQ and LLQ fluid collection drains placed on 11/21 by Dr Jenna.  Drain has been removed 11/28 - Advanced to soft diet.  Now off TPN.  General surgery following - Abscess culture showing achromobacter. Discussed with pharmacy. Changed vancomycin /cefepime  --> Merrem  through 12/3. Susceptibility pending, sent out to [facility]  RUE DVT - Full dose lovenox  - Per PICC line team, okay to use PICC line  Demand ischemia - Stable  Hyperlipidemia - Pravachol    History of stroke - Plavix  can be discontinued per Dr. Rosemarie  History of adenocarcinoma of left lung, stage III - Diagnosed in 2022 and received chemoradiation at that time  Alzheimer's disease  GERD - Protonix   Prediabetes - A1c 5.9  Hypothyroidism - Synthroid    Generalized weakness, deconditioning - PT OT - SNF recommended  Acute urinary retention - Flomax  - Removed Foley catheter 11/29  Pressure injury sacral region stage II, not present on admission   DVT prophylaxis: Lovenox  Code Status: Full Family Communication: Daughter at bedside  Disposition Plan: SNF Status is: Inpatient Remains inpatient appropriate because: IV antibiotics through 12/3, PICC to be removed once IV antibiotic completed.  Planning for DC to SNF 12/4   Antimicrobials:  Anti-infectives (From admission, onward)    Start     Dose/Rate Route Frequency Ordered Stop  04/16/24 1800  meropenem  (MERREM ) 1 g in sodium chloride  0.9 % 100 mL IVPB        1 g 200 mL/hr over 30 Minutes Intravenous Every 12 hours 04/16/24 1056 04/22/24 2359   04/15/24 1400  meropenem  (MERREM ) 1 g in sodium chloride  0.9 % 100 mL IVPB  Status:  Discontinued        1  g 200 mL/hr over 30 Minutes Intravenous Every 8 hours 04/15/24 0915 04/16/24 1056   04/06/24 1300  metroNIDAZOLE  (FLAGYL ) IVPB 500 mg  Status:  Discontinued        500 mg 100 mL/hr over 60 Minutes Intravenous Every 12 hours 04/06/24 1217 04/15/24 0915   04/03/24 1330  metroNIDAZOLE  (FLAGYL ) IVPB 500 mg        500 mg 100 mL/hr over 60 Minutes Intravenous On call to O.R. 04/03/24 1242 04/03/24 1507   03/30/24 2200  vancomycin  (VANCOREADY) IVPB 750 mg/150 mL  Status:  Discontinued        750 mg 150 mL/hr over 60 Minutes Intravenous Every 24 hours 03/29/24 1620 04/03/24 1310   03/30/24 0400  ceFEPIme  (MAXIPIME ) 2 g in sodium chloride  0.9 % 100 mL IVPB  Status:  Discontinued        2 g 200 mL/hr over 30 Minutes Intravenous Every 12 hours 03/29/24 1620 04/15/24 0915   03/29/24 1530  vancomycin  (VANCOCIN ) IVPB 1000 mg/200 mL premix        1,000 mg 200 mL/hr over 60 Minutes Intravenous  Once 03/29/24 1517 03/30/24 0228   03/29/24 1530  ceFEPIme  (MAXIPIME ) 2 g in sodium chloride  0.9 % 100 mL IVPB        2 g 200 mL/hr over 30 Minutes Intravenous  Once 03/29/24 1518 03/29/24 1724        Objective: Vitals:   04/20/24 1600 04/20/24 2044 04/21/24 0531 04/21/24 0758  BP:  (!) 142/70 (!) 150/88   Pulse:  93 93   Resp:  15 15   Temp:  98.6 F (37 C) 98.2 F (36.8 C)   TempSrc:  Oral Oral   SpO2:  99% 100% 99%  Weight: 72.2 kg     Height: 5' 5 (1.651 m)       Intake/Output Summary (Last 24 hours) at 04/21/2024 1056 Last data filed at 04/21/2024 0900 Gross per 24 hour  Intake 820 ml  Output 1130 ml  Net -310 ml   Filed Weights   04/07/24 0400 04/09/24 0524 04/20/24 1600  Weight: 58.8 kg 62.7 kg 72.2 kg   Examination: General exam: Appears calm and comfortable  Respiratory system: Clear to auscultation. Respiratory effort normal. Cardiovascular system: S1 & S2 heard, RRR. No pedal edema. Gastrointestinal system: Abdomen is nondistended, soft and nontender Central nervous system:  Alert  Extremities: Right forearm with dependent edema  Data Reviewed: I have personally reviewed following labs and imaging studies  CBC: Recent Labs  Lab 04/15/24 1508 04/16/24 1438 04/17/24 0337 04/18/24 0337 04/19/24 0306 04/20/24 0259  WBC 24.4* 21.0* 19.4* 14.4* 10.7* 8.6  NEUTROABS 21.5*  --   --   --   --   --   HGB 8.6* 7.5* 7.3* 7.4* 8.2* 7.7*  HCT 26.4* 22.8* 22.3* 23.5* 25.6* 24.1*  MCV 92.6 91.9 92.9 93.6 93.8 92.7  PLT 272 268 278 282 301 302   Basic Metabolic Panel: Recent Labs  Lab 04/16/24 0327 04/19/24 0306  NA 134* 134*  K 4.3 3.9  CL 100 100  CO2 29 27  GLUCOSE  111* 78  BUN 23 13  CREATININE 0.56 0.58  CALCIUM  8.2* 8.7*  MG 2.0  --   PHOS 2.5  --    GFR: Estimated Creatinine Clearance: 52.1 mL/min (by C-G formula based on SCr of 0.58 mg/dL). Liver Function Tests: Recent Labs  Lab 04/16/24 0327  AST 17  ALT 7  ALKPHOS 51  BILITOT <0.2  PROT 5.5*  ALBUMIN  2.2*   No results for input(s): LIPASE, AMYLASE in the last 168 hours. No results for input(s): AMMONIA in the last 168 hours. Coagulation Profile: No results for input(s): INR, PROTIME in the last 168 hours. Cardiac Enzymes: No results for input(s): CKTOTAL, CKMB, CKMBINDEX, TROPONINI in the last 168 hours. BNP (last 3 results) No results for input(s): PROBNP in the last 8760 hours. HbA1C: No results for input(s): HGBA1C in the last 72 hours. CBG: Recent Labs  Lab 04/18/24 0650 04/18/24 1118 04/18/24 1701 04/18/24 2109 04/19/24 0740  GLUCAP 95 104* 134* 86 75   Lipid Profile: No results for input(s): CHOL, HDL, LDLCALC, TRIG, CHOLHDL, LDLDIRECT in the last 72 hours.  Thyroid  Function Tests: No results for input(s): TSH, T4TOTAL, FREET4, T3FREE, THYROIDAB in the last 72 hours. Anemia Panel: No results for input(s): VITAMINB12, FOLATE, FERRITIN, TIBC, IRON , RETICCTPCT in the last 72 hours. Sepsis Labs: No results for  input(s): PROCALCITON, LATICACIDVEN in the last 168 hours.  No results found for this or any previous visit (from the past 240 hours).     Radiology Studies: No results found.     Scheduled Meds:  budesonide  (PULMICORT ) nebulizer solution  0.5 mg Nebulization BID   Chlorhexidine  Gluconate Cloth  6 each Topical Q0600   enoxaparin  (LOVENOX ) injection  60 mg Subcutaneous Q12H   feeding supplement (KATE FARMS STANDARD ENT 1.4)  325 mL Oral BID BM   fluticasone   1 spray Each Nare Daily   hydrocortisone  cream   Topical BID   ipratropium  2 spray Each Nare TID   levothyroxine   50 mcg Oral QAC breakfast   montelukast   10 mg Oral QHS   pantoprazole   40 mg Oral BID   polycarbophil  625 mg Oral BID   pravastatin   80 mg Oral Daily   sodium chloride  flush  10-40 mL Intracatheter Q12H   tamsulosin   0.4 mg Oral Daily   Continuous Infusions:  meropenem  (MERREM ) IV 1 g (04/21/24 0601)     LOS: 23 days   Time spent: 25 minutes   Delon Hoe, DO [facility] 04/21/2024, 10:56 AM   Available via Epic secure chat 7am-7pm After these hours, please refer to coverage provider listed on [email or web address]

## 2024-04-22 ENCOUNTER — Other Ambulatory Visit (HOSPITAL_COMMUNITY): Payer: Self-pay

## 2024-04-22 ENCOUNTER — Telehealth (HOSPITAL_COMMUNITY): Payer: Self-pay

## 2024-04-22 DIAGNOSIS — J90 Pleural effusion, not elsewhere classified: Secondary | ICD-10-CM | POA: Diagnosis not present

## 2024-04-22 DIAGNOSIS — D72829 Elevated white blood cell count, unspecified: Secondary | ICD-10-CM

## 2024-04-22 DIAGNOSIS — J189 Pneumonia, unspecified organism: Secondary | ICD-10-CM | POA: Diagnosis not present

## 2024-04-22 DIAGNOSIS — R1084 Generalized abdominal pain: Secondary | ICD-10-CM | POA: Diagnosis not present

## 2024-04-22 MED ORDER — APIXABAN 5 MG PO TABS: 5.0000 mg | ORAL_TABLET | Freq: Two times a day (BID) | ORAL | Status: DC

## 2024-04-22 MED ORDER — OXYCODONE-ACETAMINOPHEN 5-325 MG PO TABS: 1.0000 | ORAL_TABLET | Freq: Four times a day (QID) | ORAL | 0 refills | Status: AC | PRN

## 2024-04-22 MED ORDER — APIXABAN 5 MG PO TABS
5.0000 mg | ORAL_TABLET | Freq: Two times a day (BID) | ORAL | Status: DC
  Administered 2024-04-22 – 2024-04-23 (×3): 5 mg via ORAL
  Filled 2024-04-22 (×3): qty 1

## 2024-04-22 NOTE — Plan of Care (Signed)
  Problem: Elimination: Goal: Will not experience complications related to bowel motility Outcome: Progressing   Problem: Tissue Perfusion: Goal: Adequacy of tissue perfusion will improve Outcome: Progressing

## 2024-04-22 NOTE — Progress Notes (Signed)
 PICC line removal. HOB less than 45*. Pt held breath with line removal. Pressure held with no s/sx of bleeding. Pt instructed to remain in bed for 30 min. Pressure drsg applied, to remain in place 24 hrs. Notified RN. Powell Bowler, RN VAST

## 2024-04-22 NOTE — Progress Notes (Signed)
 Occupational Therapy Treatment Patient Details Name: AZELYN BATIE MRN: [id number] DOB: 01/23/1941 Today's Date: 04/22/2024   History of present illness SONNIA STRONG is a 83 yr old female who presented to ED with complaints of weakness; admitted 11/9 with large loculated left pleural effusion.03/31/2024 diagnosed with SBO, s/p exploratory laparotomy  11/14 NGT placed and 04/01/24 s/p pigtail chest tube. PMH: GERD, HTN, HLD, IDA, asthma, stage 3b adenocarcinoma of the lung, hypothyroidism, CVA, Alzheimer's, SBO due to adhesions in 2016 and 2021   OT comments  The pt was assisted into sitting EOB were she presented with fair sitting balance while performing upper body grooming. She subsequently required min assist to stand from the elevated bed surface using a RW. She was able to take 4 forward steps and 4 backward steps using the RW, though needing consistent assist for balance as well as cues for walker placement and improved body positioning. She was assisted back to bed. She was noted to require increased time and effort for some tasks, due to indications of significant abdominal pain and associated guarding of movements. Continue OT plan of care. Patient will benefit from continued inpatient follow up therapy, <3 hours/day.       If plan is discharge home, recommend the following:  A lot of help with walking and/or transfers;A lot of help with bathing/dressing/bathroom;Supervision due to cognitive status;Assistance with cooking/housework;Direct supervision/assist for financial management;Help with stairs or ramp for entrance;Direct supervision/assist for medications management   Equipment Recommendations  Other (comment) (defer to next setting)    Recommendations for Other Services      Precautions / Restrictions Precautions Precautions: Fall Restrictions Weight Bearing Restrictions Per Provider Order: No Other Position/Activity Restrictions: abdominal surgery       Mobility  Bed Mobility Overal bed mobility: Needs Assistance Bed Mobility: Rolling, Sidelying to Sit Rolling: Mod assist, +2 for safety/equipment, Used rails     Sit to supine: Mod assist        Transfers Overall transfer level: Needs assistance Equipment used: Rolling walker (2 wheels) Transfers: Sit to/from Stand Sit to Stand: Min assist, From elevated surface           General transfer comment: Once in standing, OT provided cues for the pt to correct forward flexed posture. She subsequently required min-to mod assist to take 4 forward steps then 4 backwards steps using a RW     Balance     Sitting balance-Leahy Scale: Fair       Standing balance-Leahy Scale: Poor            ADL either performed or assessed with clinical judgement   ADL Overall ADL's : Needs assistance/impaired Eating/Feeding: Set up;Bed level Eating/Feeding Details (indicate cue type and reason): She drank from a cup after needing light verbal and tactile cues to initiate tasks. Grooming: Supervision/safety;Set up;Sitting Grooming Details (indicate cue type and reason): She performed hand washing, face washing and teeth brushing seated EOB. She was noted to intermittently demonstrate upper extremity propping and leaning to the R on the bed  while performing task.             Lower Body Dressing: Total assistance;Sitting/lateral leans Lower Body Dressing Details (indicate cue type and reason): for sock management with pt limited by abdominal discomfort and associated guarding                     Communication Communication Factors Affecting Communication: Difficulty expressing self   Cognition Arousal: Alert Behavior During Therapy:  WFL for tasks assessed/performed Cognition: History of cognitive impairments   Orientation impairments: Time, Situation Awareness: Online awareness impaired Memory impairment (select all impairments): Short-term memory, Working memory Attention impairment  (select first level of impairment): Divided attention, Selective attention        Following commands: Impaired Following commands impaired: Follows one step commands with increased time      Cueing   Cueing Techniques: Verbal cues, Gestural cues, Visual cues, Tactile cues             Pertinent Vitals/ Pain       Pain Assessment Pain Assessment: Faces Pain Score: 6  Pain Location: abdomen Pain Descriptors / Indicators: Grimacing, Moaning, Guarding Pain Intervention(s): Limited activity within patient's tolerance, Monitored during session, Repositioned, Other (comment) (Nurse informed)   Frequency  Min 2X/week        Progress Toward Goals  OT Goals(current goals can now be found in the care plan section)  Progress towards OT goals: Progressing toward goals  Acute Rehab OT Goals OT Goal Formulation: With patient Time For Goal Achievement: 05/01/24 Potential to Achieve Goals: Good  Plan         AM-PAC OT 6 Clicks Daily Activity     Outcome Measure   Help from another person eating meals?: A Little Help from another person taking care of personal grooming?: A Little Help from another person toileting, which includes using toliet, bedpan, or urinal?: A Lot Help from another person bathing (including washing, rinsing, drying)?: A Lot Help from another person to put on and taking off regular upper body clothing?: A Lot Help from another person to put on and taking off regular lower body clothing?: A Lot 6 Click Score: 14    End of Session Equipment Utilized During Treatment: Gait belt;Rolling walker (2 wheels)  OT Visit Diagnosis: Pain;Muscle weakness (generalized) (M62.81);Other abnormalities of gait and mobility (R26.89);Unsteadiness on feet (R26.81);Other symptoms and signs involving cognitive function Pain - part of body:  (abdomen)   Activity Tolerance Patient limited by pain   Patient Left in bed;with call bell/phone within reach;with bed alarm set    Nurse Communication Patient requests pain meds        Time: 1531-1550 OT Time Calculation (min): 19 min  Charges: OT General Charges $OT Visit: 1 Visit OT Treatments $Self Care/Home Management : 8-22 mins    [name], OTR/L 04/22/2024, 5:27 PM

## 2024-04-22 NOTE — Progress Notes (Incomplete)
 [facility] PROGRESS NOTE    Progress Note  Angelica [name]  FMW:[id number] DOB: 05-08-1941 DOA: 03/29/2024 PCP: Norleen Lynwood ORN, MD     Brief Narrative:   Angelica [name] is an 83 y.o. female past medical history of essential hypertension, asthma, stage IIIb adenocarcinoma of the lung, hypothyroidism, Alzheimer's dementia history of small bowel obstruction due to adhesion in 2016 and 2021 comes in on 04/08/2024 with weakness and breast swelling with leukocytosis and lactic acid of 2.1, chest x-ray showed complete opacification of the left lung.  CT scan of the abdomen and pelvis showed resolution of previous small bowel.  Loculated rim-enhancing retroperitoneal fluid adjacent to anastomosis concerning for postoperative abscess measuring about 3 x 4 x 8 cm and a second rim-enhancing peritoneal fluid collection measuring 2 x 4 x 10 cm.  With residual loculated left pleural effusion measuring 2 x 6 x 5 cm.  Pulmonary was consulted along with general surgery NG tube was placed underwent lysis adhesion and small bowel resection 04/03/2024 started on TPN with improvement on her oral intake.  TPN has been discontinued.  She developed right upper extremity DVT and started on full dose Lovenox    Assessment/Plan:   Pleural effusion associated with pulmonary infection: In the setting of pulmonary infection versus recurrent malignancy. Pulmonary was consulted chest tube was placed on 04/01/2024 removal 04/04/2024. Underwent fibrinolysis x 2. Fluid suggestive of infection, cytology is negative. Has completed course of antibiotics.  Pulmonary will sign off.  Small bowel obstruction status post exploratory laparotomy on 04/03/2024. General surgery was consulted status post left upper quadrant and left lower quadrant fluid collection drain placed  by IR on 04/10/2024, removed on 04/17/2024. Now on a soft diet. Off TPN tolerating her diet. Abscess culture grew achromobacter, susceptibilities  pending. Has completed course of IV meropenem  05/02/2024. Discontinue PICC line upon discharge  Right upper extremity DVT: PICC team okay to use PICC line on full dose Lovenox . Transition to a DOAC upon discharge.  Elevated troponins: Likely demand ischemia.  Hyperlipidemia: Continue statin's.  History of stroke: Per Dr. Rosemarie Plavix  can be discontinued.  History of adenocarcinoma of the left lung stage III: Diagnosed in 2022 follow-up with oncology as an outpatient.  Alzheimer's dementia: Noted.  GERD: Continue PPI.  Hypothyroidism:  Continue Synthroid .  Generalized weakness/deconditioning: PT OT evaluated the patient will need skilled nursing facility.  Urinary retention: Foley removed on 04/18/2024.  Stage II sacral decubitus ulcer not present on admission: Noted RN Pressure Injury Documentation: Wound 04/12/24 2315 Pressure Injury Sacrum Medial Stage 2 -  Partial thickness loss of dermis presenting as a shallow open injury with a red, pink wound bed without slough. (Active)    Estimated body mass index is 26.48 kg/m as calculated from the following:   Height as of this encounter: 5' 5 (1.651 m).   Weight as of this encounter: 72.2 kg.   DVT prophylaxis: Eliquis  Family Communication:none Status is: Inpatient Remains inpatient appropriate because: Awaiting skilled nursing facility placement.    Code Status:     Code Status Orders  (From admission, onward)           Start     Ordered   03/29/24 1721  Full code  Continuous       Question:  By:  Answer:  Consent: discussion documented in EHR   03/29/24 1721           Code Status History     Date Active Date Inactive Code Status Order ID  Comments User Context   10/05/2022 1833 10/09/2022 2248 Full Code [id number]  Waddell Rake, MD ED   11/02/2020 1440 11/08/2020 1807 Full Code [id number]  Vernon Ranks, MD ED   10/20/2020 0416 10/22/2020 1939 Full Code [id number]  Shona Terry SAILOR, DO ED    05/17/2020 2024 06/03/2020 1901 Full Code [id number]  Charlton Evalene RAMAN, MD ED   04/10/2015 0042 04/24/2015 1603 Full Code [id number]  Lonzell Emeline HERO, DO ED      Advance Directive Documentation    Flowsheet Row Most Recent Value  Type of Advance Directive Healthcare Power of Attorney  Pre-existing out of facility DNR order (yellow form or pink MOST form) --  MOST Form in Place? --      IV Access:   Peripheral IV   Procedures and diagnostic studies:   No results found.   Medical Consultants:   None.   Subjective:    Angelica [name]   Objective:    Vitals:   04/21/24 1958 04/21/24 2123 04/22/24 0616 04/22/24 0733  BP:  (!) 101/56 (!) 141/77   Pulse:  80 86   Resp:  16 16   Temp:  98.5 F (36.9 C) 98.5 F (36.9 C)   TempSrc:  Oral Oral   SpO2: 98% 99% 99% 98%  Weight:      Height:       SpO2: 98 % O2 Flow Rate (L/min): 2 L/min FiO2 (%): 21 %   Intake/Output Summary (Last 24 hours) at 04/22/2024 0914 Last data filed at 04/22/2024 0500 Gross per 24 hour  Intake 750 ml  Output 750 ml  Net 0 ml   Filed Weights   04/07/24 0400 04/09/24 0524 04/20/24 1600  Weight: 58.8 kg 62.7 kg 72.2 kg    Exam: General exam: In no acute distress. Respiratory system: Good air movement and clear to auscultation. Cardiovascular system: S1 & S2 heard, RRR. No JVD, murmurs, rubs, gallops or clicks.  Gastrointestinal system: Abdomen is nondistended, soft and nontender.  Central nervous system: Alert and oriented. No focal neurological deficits. Extremities: No pedal edema. Skin: No rashes, lesions or ulcers Psychiatry: Judgement and insight appear normal. Mood & affect appropriate.    Data Reviewed:    Labs: Basic Metabolic Panel: Recent Labs  Lab 04/16/24 0327 04/19/24 0306  NA 134* 134*  K 4.3 3.9  CL 100 100  CO2 29 27  GLUCOSE 111* 78  BUN 23 13  CREATININE 0.56 0.58  CALCIUM  8.2* 8.7*  MG 2.0  --   PHOS 2.5  --    GFR Estimated Creatinine  Clearance: 52.1 mL/min (by C-G formula based on SCr of 0.58 mg/dL). Liver Function Tests: Recent Labs  Lab 04/16/24 0327  AST 17  ALT 7  ALKPHOS 51  BILITOT <0.2  PROT 5.5*  ALBUMIN  2.2*   No results for input(s): LIPASE, AMYLASE in the last 168 hours. No results for input(s): AMMONIA in the last 168 hours. Coagulation profile No results for input(s): INR, PROTIME in the last 168 hours. COVID-19 Labs  No results for input(s): DDIMER, FERRITIN, LDH, CRP in the last 72 hours.  Lab Results  Component Value Date   SARSCOV2NAA NEGATIVE 07/24/2021   SARSCOV2NAA NEGATIVE 11/02/2020   SARSCOV2NAA NEGATIVE 10/19/2020   SARSCOV2NAA POSITIVE (A) 05/17/2020    CBC: Recent Labs  Lab 04/15/24 1508 04/16/24 1438 04/17/24 0337 04/18/24 0337 04/19/24 0306 04/20/24 0259  WBC 24.4* 21.0* 19.4* 14.4* 10.7* 8.6  NEUTROABS 21.5*  --   --   --   --   --  HGB 8.6* 7.5* 7.3* 7.4* 8.2* 7.7*  HCT 26.4* 22.8* 22.3* 23.5* 25.6* 24.1*  MCV 92.6 91.9 92.9 93.6 93.8 92.7  PLT 272 268 278 282 301 302   Cardiac Enzymes: No results for input(s): CKTOTAL, CKMB, CKMBINDEX, TROPONINI in the last 168 hours. BNP (last 3 results) No results for input(s): PROBNP in the last 8760 hours. CBG: Recent Labs  Lab 04/18/24 0650 04/18/24 1118 04/18/24 1701 04/18/24 2109 04/19/24 0740  GLUCAP 95 104* 134* 86 75   D-Dimer: No results for input(s): DDIMER in the last 72 hours. Hgb A1c: No results for input(s): HGBA1C in the last 72 hours. Lipid Profile: No results for input(s): CHOL, HDL, LDLCALC, TRIG, CHOLHDL, LDLDIRECT in the last 72 hours. Thyroid  function studies: No results for input(s): TSH, T4TOTAL, T3FREE, THYROIDAB in the last 72 hours.  Invalid input(s): FREET3 Anemia work up: No results for input(s): VITAMINB12, FOLATE, FERRITIN, TIBC, IRON , RETICCTPCT in the last 72 hours. Sepsis Labs: Recent Labs  Lab 04/17/24 0337  04/18/24 0337 04/19/24 0306 04/20/24 0259  WBC 19.4* 14.4* 10.7* 8.6   Microbiology No results found for this or any previous visit (from the past 240 hours).   Medications:    budesonide  (PULMICORT ) nebulizer solution  0.5 mg Nebulization BID   Chlorhexidine  Gluconate Cloth  6 each Topical Q0600   enoxaparin  (LOVENOX ) injection  60 mg Subcutaneous Q12H   feeding supplement (KATE FARMS STANDARD ENT 1.4)  325 mL Oral BID BM   fluticasone   1 spray Each Nare Daily   hydrocortisone  cream   Topical BID   ipratropium  2 spray Each Nare TID   levothyroxine   50 mcg Oral QAC breakfast   montelukast   10 mg Oral QHS   pantoprazole   40 mg Oral BID   polycarbophil  625 mg Oral BID   pravastatin   80 mg Oral Daily   sodium chloride  flush  10-40 mL Intracatheter Q12H   tamsulosin   0.4 mg Oral Daily   Continuous Infusions:  meropenem  (MERREM ) IV 1 g (04/22/24 0522)      LOS: 24 days   Erle Odell Castor  [facility]  04/22/2024, 9:14 AM

## 2024-04-22 NOTE — Discharge Summary (Signed)
 Physician Discharge Summary  Angelica [name] FMW:[id number] DOB: 1941-03-22 DOA: 03/29/2024  PCP: Norleen Lynwood ORN, MD  Admit date: 03/29/2024 Discharge date: 04/22/2024  Admitted From: Home Disposition:  SNF  Recommendations for Outpatient Follow-up:  Follow up with oncology in 1-2 weeks Please obtain BMP/CBC in one week Palliative care to meet with family at facility to discuss goals of care and CODE STATUS.   Home Health:No Equipment/Devices:None  Discharge Condition:Guarded CODE STATUS:Full Diet recommendation: Heart Healthy  Brief/Interim Summary: 83 y.o. female past medical history of essential hypertension, asthma, stage IIIb adenocarcinoma of the lung, hypothyroidism, Alzheimer's dementia history of small bowel obstruction due to adhesion in 2016 and 2021 comes in on 04/08/2024 with weakness and breast swelling with leukocytosis and lactic acid of 2.1, chest x-ray showed complete opacification of the left lung.  CT scan of the abdomen and pelvis showed resolution of previous small bowel.  Loculated rim-enhancing retroperitoneal fluid adjacent to anastomosis concerning for postoperative abscess measuring about 3 x 4 x 8 cm and a second rim-enhancing peritoneal fluid collection measuring 2 x 4 x 10 cm.  With residual loculated left pleural effusion measuring 2 x 6 x 5 cm.  Pulmonary was consulted along with general surgery NG tube was placed underwent lysis adhesion and small bowel resection 04/03/2024 started on TPN with improvement on her oral intake.  TPN has been discontinued.  She developed right upper extremity DVT and started on full dose Lovenox    Discharge Diagnoses:  Principal Problem:   Pleural effusion associated with pulmonary infection vs. recurrent malignancy and sepsis Active Problems:   Weakness   Adenocarcinoma of left lung, stage 3 (HCC)   HTN (hypertension)   Elevated troponin   HLD (hyperlipidemia)   History of stroke   Hypothyroidism   Asthma, severe  persistent, well-controlled (HCC)   Alzheimer's disease (HCC)   GERD (gastroesophageal reflux disease)   Constipation   SBO (small bowel obstruction) (HCC)   Malnutrition of moderate degree   Hypokalemia   Sepsis (HCC)   Prediabetes   Sinus bradycardia   Electrolyte abnormality   Pressure injury of sacral region, stage 2 (HCC)   Acute urinary retention  Multifocal pneumonia with empyema: Pulmonary was consulted chest tube was placed on 04/01/2024 removed on 04/04/2024 she underwent fibrinolysis therapy x 2. Fluid was suggestive of infection cytology negative for malignancy. She completed her course in house. Patient has an extremely poor prognosis; she is an 83 year old with advanced dementia, multiple abdominal surgeries now status status post exploratory laparotomy, with a stage III lung cancer. Palliative care to meet with family and patient and facility to discuss goals of care and CODE STATUS.  Small bowel obstruction status post exploratory laparotomy 04/03/2024 with multiple abscesses. General surgery was consulted she status post left upper quadrant and left lower quadrant fluid collection drain placement on 04/10/2024 remove on 04/17/2024. She was temporarily on TPN and was able to tolerate her diet.[name] PICC line was placed. Abscess culture grew achromobacter, she completed course of antibiotics 08/08/2023. PICC line was discontinued.  Right upper extremity DVT: He was started on Lovenox . PICC line was discontinued she was transition to oral Eliquis which should continue as an outpatient.  Elevated troponins: A demand ischemia she denies any chest pain.  Hyperlipidemia:  Continue statins.  History of strokes: He was discussed with Dr. Welton he is okay to discontinue Plavix .  History of adenocarcinoma of the lung stage III: Diagnosed in 2022 follow-up with oncology as an outpatient.  Alzheimer's dementia: Noted.  GERD: Continue PPI.  Hypothyroidism: Continue  Synthroid .  Generalized weakness/deconditioning: PT evaluated the patient she will need to go to skilled nursing facility.  Discharge Instructions  Discharge Instructions     Diet - low sodium heart healthy   Complete by: As directed    Discharge wound care:   Complete by: As directed    Per wound care instructions.   Increase activity slowly   Complete by: As directed       Allergies as of 04/22/2024       Reactions   Asa Buff (mag [buffered Aspirin] Other (See Comments)   Excessive sweating   Ensure [nutritional Supplements]    Or boost - upset stomach    Fish Allergy Other (See Comments)   Unknown reaction, but allergic. Has tolerated Intralipids in TpN 2022/2025   Other Other (See Comments)   Gelcaps; gel-containing capsules; extended-release medication - upset stomach    Peanut-containing Drug Products Other (See Comments)   From allergy test, Has tolerated Intralipids in TpN 2022/2025   Penicillins Itching   Shellfish Protein-containing Drug Products Other (See Comments)   From allergy test, Has tolerated Intralipids in TpN 2022/2025   Strawberry Extract Nausea And Vomiting, Other (See Comments)   From allergy test         Medication List     STOP taking these medications    amLODipine  10 MG tablet Commonly known as: NORVASC    clopidogrel  75 MG tablet Commonly known as: PLAVIX    fluticasone  50 MCG/ACT nasal spray Commonly known as: FLONASE    gentamicin  ointment 0.1 % Commonly known as: GARAMYCIN    potassium chloride  SA 20 MEQ tablet Commonly known as: KLOR-CON  M       TAKE these medications    acetaminophen  500 MG tablet Commonly known as: TYLENOL  Take 1,000 mg by mouth every 6 (six) hours as needed for moderate pain.   albuterol  108 (90 Base) MCG/ACT inhaler Commonly known as: Ventolin  HFA Inhale 2 puffs into the lungs every 4 (four) hours as needed for wheezing or shortness of breath.   alum & mag hydroxide-simeth 200-200-20 MG/5ML  suspension Commonly known as: MAALOX/MYLANTA Take 15 mLs by mouth every 6 (six) hours as needed for indigestion or heartburn.   apixaban 5 MG Tabs tablet Commonly known as: ELIQUIS Take 1 tablet (5 mg total) by mouth 2 (two) times daily.   cetirizine  10 MG tablet Commonly known as: EQ Allergy Relief (Cetirizine ) Take 1 tablet (10 mg total) by mouth daily.   diclofenac  Sodium 1 % Gel Commonly known as: VOLTAREN  Apply 4 g topically 4 (four) times daily. What changed:  when to take this reasons to take this   EPINEPHrine  0.3 mg/0.3 mL Soaj injection Commonly known as: EpiPen  2-Pak Use as directed for severe allergic reaction   Fasenra  30 MG/ML prefilled syringe Generic drug: benralizumab  Inject 1 mL (30 mg total) into the skin every 8 (eight) weeks.   ipratropium 0.06 % nasal spray Commonly known as: ATROVENT  Place 2 sprays into both nostrils 3 (three) times daily as needed for rhinitis.   levothyroxine  50 MCG tablet Commonly known as: SYNTHROID  TAKE 1 TABLET BY MOUTH ONCE DAILY BEFORE BREAKFAST   memantine  10 MG tablet Commonly known as: NAMENDA  Take 1 tablet (10 mg total) by mouth 2 (two) times daily.   montelukast  10 MG tablet Commonly known as: SINGULAIR  Take 1 tablet (10 mg total) by mouth at bedtime.   ondansetron  4 MG disintegrating tablet Commonly known as: ZOFRAN -ODT Take 1 tablet (  4 mg total) by mouth every 8 (eight) hours as needed for nausea or vomiting.   oxyCODONE -acetaminophen  5-325 MG tablet Commonly known as: PERCOCET/ROXICET Take 1-2 tablets by mouth every 6 (six) hours as needed for up to 5 days for moderate pain (pain score 4-6) or severe pain (pain score 7-10).   pantoprazole  40 MG tablet Commonly known as: PROTONIX  Take 1 tablet by mouth once daily   PHILLIPS MILK OF MAGNESIA PO Take 15-30 mLs by mouth daily as needed (for constipation).   pravastatin  80 MG tablet Commonly known as: PRAVACHOL  Take 1 tablet by mouth once daily    Symbicort  80-4.5 MCG/ACT inhaler Generic drug: budesonide -formoterol  INHALE 2 PUFFS BY MOUTH IN THE MORNING               Discharge Care Instructions  (From admission, onward)           Start     Ordered   04/22/24 0000  Discharge wound care:       Comments: Per wound care instructions.   04/22/24 9061            Contact information for follow-up providers     Teresa Lonni HERO, MD Follow up.   Specialties: General Surgery, Colon and Rectal Surgery Why: Arrive 30 mins prior to scheduled appointment time, Please bring ID and insurance card to appointment. Contact information: [address] [place] [address] [phone]              Contact information for after-discharge care     Destination     [facility] [place] .   Service: Skilled Nursing Contact information: [address] [place]  [phone]             Home Medical Care     [facility]) .   Service: Home Health Services Why: Leopoldo will provide PT in the home after discharge. Contact information: [address] [place]  [phone]                    Allergies  Allergen Reactions   Dorethia Haley (Mag [Buffered Aspirin] Other (See Comments)    Excessive sweating   Ensure [Nutritional Supplements]     Or boost - upset stomach    Fish Allergy Other (See Comments)    Unknown reaction, but allergic. Has tolerated Intralipids in TpN 2022/2025   Other Other (See Comments)    Gelcaps; gel-containing capsules; extended-release medication - upset stomach    Peanut-Containing Drug Products Other (See Comments)    From allergy test, Has tolerated Intralipids in TpN 2022/2025    Penicillins Itching   Shellfish Protein-Containing Drug Products Other (See Comments)    From allergy test, Has tolerated Intralipids in TpN 2022/2025    Strawberry Extract Nausea And  Vomiting and Other (See Comments)    From allergy test     Consultations: General Surgery Palliative care Pulmonary   Procedures/Studies: DG Sinus/Fist Tube Chk-Non GI Result Date: 04/17/2024 INDICATION: Previous small-bowel obstructions status post laparotomy and partial small-bowel resection, postop abscess is post drain catheter placement x2. Recent CT shows resolution of extraluminal fluid collections. No further output from drain catheters. Removal requested. EXAM: INJECTION OF LEFT UPPER QUADRANT DRAIN CATHETER UNDER FLUOROSCOPY INJECTION OF LEFT LOWER QUADRANT DRAIN CATHETER UNDER FLUOROSCOPY MEDICATIONS: no periprocedural antibiotics were indicated ANESTHESIA/SEDATION: None required PROCEDURE: Scout radiograph obtained. Contrast injection through the left lower quadrant catheter fills  a small extraluminal collection. With aspiration, the contrast is removed. In similar fashion, contrast injection of the left upper quadrant catheter fills a small lateral irregular collection in tracks back along the catheter tubing. With aspiration, most of the contrast is removed. No intraluminal contrast extension is noted from either injection. FLUOROSCOPY TIME:  Radiation Exposure Index (as provided by the fluoroscopic device): 22.5 mGy air Kerma COMPLICATIONS: None immediate. FINDINGS: Resolution of left abdominal abscesses post drain catheter placement. No evidence of fistula to bowel. The drain catheters were cut and removed. IMPRESSION: 1. Resolution of left abdominal abscesses post drain catheter placement. 2. No fistula to bowel. 3. Removal of drain catheters. Electronically Signed   By: JONETTA Faes M.D.   On: 04/17/2024 15:59   US  EKG SITE RITE Result Date: 04/16/2024 If Site Rite image not attached, placement could not be confirmed due to current cardiac rhythm.  CT ABDOMEN PELVIS W CONTRAST Result Date: 04/15/2024 EXAM: CT ABDOMEN AND PELVIS WITH CONTRAST 04/15/2024 04:21:00 PM TECHNIQUE: CT  of the abdomen and pelvis was performed with the administration of 100 mL of iohexol  (OMNIPAQUE ) 300 MG/ML solution. Multiplanar reformatted images are provided for review. Automated exposure control, iterative reconstruction, and/or weight-based adjustment of the mA/kV was utilized to reduce the radiation dose to as low as reasonably achievable. COMPARISON: 04/10/2024 CLINICAL HISTORY: s/p drainage of LUQ/LLQ abdominal fluid collections 11/21 FINDINGS: LOWER CHEST: Small bilateral pleural effusions, stable since prior study. Compressive atelectasis in the lower lobes. Coronary artery calcifications. LIVER: The liver is unremarkable. GALLBLADDER AND BILE DUCTS: Gallbladder is unremarkable. No biliary ductal dilatation. SPLEEN: No acute abnormality. PANCREAS: No acute abnormality. ADRENAL GLANDS: No acute abnormality. KIDNEYS, URETERS AND BLADDER: No stones in the kidneys or ureters. No hydronephrosis. No perinephric or periureteral stranding. Foley catheter in the bladder, which is decompressed. GI AND BOWEL: Postoperative changes in left abdominal small bowel loops. Stomach demonstrates no acute abnormality. There is no bowel obstruction. PERITONEUM AND RETROPERITONEUM: Previously seen left fluid collections have been drained with drainage catheters in place. The larger left lower quadrant fluid collection has decreased significantly in size measuring maximally 1.4 cm compared to 3.8 x 3.2 cm previously. The smaller anterior left upper quadrant fluid collection is no longer visible. No free air. VASCULATURE: Aorta is normal in caliber. LYMPH NODES: No lymphadenopathy. REPRODUCTIVE ORGANS: No acute abnormality. BONES AND SOFT TISSUES: No acute osseous abnormality. No focal soft tissue abnormality. IMPRESSION: 1. Status post drainage of LUQ/LLQ abdominal fluid collections with drainage catheters in place, with marked interval decrease of the larger collection to 1.4 cm and resolution of the smaller collection. 2.  Small bilateral pleural effusions, stable since prior study. Electronically signed by: Franky Crease MD 04/15/2024 10:14 PM EST RP Workstation: [id number]   VAS US  UPPER EXTREMITY VENOUS DUPLEX Result Date: 04/15/2024 UPPER VENOUS STUDY  Patient Name:  Angelica [name]  Date of Exam:   04/14/2024 Medical Rec #: [id number]         Accession #:    [id number] Date of Birth: [birth year]          Patient Gender: F Patient Age:   51 years Exam Location:  [facility] Procedure:      VAS US  UPPER EXTREMITY VENOUS DUPLEX Referring Phys: DELON YATES --------------------------------------------------------------------------------  Indications: Edema, and lung cancer. Comparison Study: No prior exam of the right upper extremity on file. Performing Technologist: Edilia Elden Appl  Examination Guidelines: A complete evaluation includes B-mode imaging, spectral Doppler, color Doppler, and power Doppler  as needed of all accessible portions of each vessel. Bilateral testing is considered an integral part of a complete examination. Limited examinations for reoccurring indications may be performed as noted.  Right Findings: +----------+------------+---------+-----------+----------+---------------+ RIGHT     CompressiblePhasicitySpontaneousProperties    Summary     +----------+------------+---------+-----------+----------+---------------+ IJV                      Yes       Yes                Resistance.   +----------+------------+---------+-----------+----------+---------------+ Subclavian    Full       Yes       No                   IV line     +----------+------------+---------+-----------+----------+---------------+ Axillary      None       No        No               Distal segment  +----------+------------+---------+-----------+----------+---------------+ Brachial      Full       Yes       Yes                               +----------+------------+---------+-----------+----------+---------------+ Radial        Full                                                  +----------+------------+---------+-----------+----------+---------------+ Ulnar         Full                                                  +----------+------------+---------+-----------+----------+---------------+ Cephalic      None       Yes       Yes              At antecubital. +----------+------------+---------+-----------+----------+---------------+ Basilic       Full       Yes       Yes                              +----------+------------+---------+-----------+----------+---------------+ Deep vein thrombosis noted in the distal portion of the axillary vein, all other segments are compressible. Focal superficial vein thrombosis noted in the cephalic vein at the antecubital fossa, all other segments are compressible.  Left Findings: +----------+------------+---------+-----------+----------+-----------+ LEFT      CompressiblePhasicitySpontaneousProperties  Summary   +----------+------------+---------+-----------+----------+-----------+ IJV                      Yes       Yes              Resistance. +----------+------------+---------+-----------+----------+-----------+ Subclavian    Full       Yes       Yes                          +----------+------------+---------+-----------+----------+-----------+  Summary:  Right: Findings consistent with acute deep vein thrombosis involving the right axillary vein. Findings  consistent with acute superficial vein thrombosis involving the right cephalic vein.  Left: No evidence of thrombosis in the subclavian.  *See table(s) above for measurements and observations.  Diagnosing physician: Lonni Gaskins MD Electronically signed by Lonni Gaskins MD on 04/15/2024 at 7:49:17 AM.    Final    CT GUIDED PERITONEAL/RETROPERITONEAL FLUID DRAIN BY [facility] CATH Result Date:  04/10/2024 INDICATION: Postoperative abdominal abscesses. EXAM: CT-guided drain placement TECHNIQUE: Multidetector CT imaging of the abdomen was performed following the standard protocol without IV contrast. RADIATION DOSE REDUCTION: This exam was performed according to the departmental dose-optimization program which includes automated exposure control, adjustment of the mA and/or kV according to patient size and/or use of iterative reconstruction technique. MEDICATIONS: The patient is currently admitted to the hospital and receiving intravenous antibiotics. The antibiotics were administered within an appropriate time frame prior to the initiation of the procedure. ANESTHESIA/SEDATION: Moderate (conscious) sedation was employed during this procedure. A total of Versed  1 mg and Fentanyl  50 mcg was administered intravenously by the radiology nurse. Total intra-service moderate Sedation Time: 1. minutes. The patient's level of consciousness and vital signs were monitored continuously by radiology nursing throughout the procedure under my direct supervision. COMPLICATIONS: None immediate. PROCEDURE: Informed written consent was obtained from the patient's daughter/POA after a thorough discussion of the procedural risks, benefits and alternatives. All questions were addressed. Maximal Sterile Barrier Technique was utilized including caps, mask, sterile gowns, sterile gloves, sterile drape, hand hygiene and skin antiseptic. A timeout was performed prior to the initiation of the procedure. Initial axial images of the abdomen redemonstrated 2 fluid collections highly suspicious for abscesses in this postoperative patient. Left upper quadrant and more midline left lower quadrant. Radiopaque markers placed on the patient's skin and both regions were marked, prepped, and draped in the usual sterile fashion. Local anesthesia was achieved with 1% lidocaine . A 7 cm Yueh needle was advanced through a small incision in the left  upper quadrant with repeat CT imaging until the needle tip was identified within the fluid collection. The needle was then withdrawn leaving the sheath in position. A short Amplatz wire was then advanced into the fluid collection. The access site was dilated with a 10 French dilator and then a 10 French pigtail catheter was coiled within the fluid collection. A small sample was obtained and capped for further laboratory evaluation. The catheter was then connected to a JP bulb. Retention suture and sterile dressing applied. A 7 cm Yueh needle was advanced through a small incision in the left lower quadrant with repeat CT imaging until the needle tip was identified within the fluid collection. The needle was then withdrawn leaving the sheath in position. A short Amplatz wire was then advanced into the fluid collection. The access site was dilated with a 10 French dilator and then a 10 French pigtail catheter was coiled within the fluid collection. A small sample was obtained and capped for further laboratory evaluation. The catheter was then connected to a JP bulb. Retention suture and sterile dressing applied. IMPRESSION: Satisfactory placement of left upper quadrant and left lower quadrant abscess drains. The fluid collections appear to be infected hematomas. Both catheter is connected to JP bulbs with instructions for flushing placed in the patient's chart. Sample sent to laboratory for culture and sensitivity. Electronically Signed   By: Cordella Banner   On: 04/10/2024 16:51   CT GUIDED PERITONEAL/RETROPERITONEAL FLUID DRAIN BY [facility] CATH Result Date: 04/10/2024 INDICATION: Postoperative abdominal abscesses. EXAM: CT-guided drain placement TECHNIQUE: Multidetector  CT imaging of the abdomen was performed following the standard protocol without IV contrast. RADIATION DOSE REDUCTION: This exam was performed according to the departmental dose-optimization program which includes automated exposure control,  adjustment of the mA and/or kV according to patient size and/or use of iterative reconstruction technique. MEDICATIONS: The patient is currently admitted to the hospital and receiving intravenous antibiotics. The antibiotics were administered within an appropriate time frame prior to the initiation of the procedure. ANESTHESIA/SEDATION: Moderate (conscious) sedation was employed during this procedure. A total of Versed  1 mg and Fentanyl  50 mcg was administered intravenously by the radiology nurse. Total intra-service moderate Sedation Time: 1. minutes. The patient's level of consciousness and vital signs were monitored continuously by radiology nursing throughout the procedure under my direct supervision. COMPLICATIONS: None immediate. PROCEDURE: Informed written consent was obtained from the patient's daughter/POA after a thorough discussion of the procedural risks, benefits and alternatives. All questions were addressed. Maximal Sterile Barrier Technique was utilized including caps, mask, sterile gowns, sterile gloves, sterile drape, hand hygiene and skin antiseptic. A timeout was performed prior to the initiation of the procedure. Initial axial images of the abdomen redemonstrated 2 fluid collections highly suspicious for abscesses in this postoperative patient. Left upper quadrant and more midline left lower quadrant. Radiopaque markers placed on the patient's skin and both regions were marked, prepped, and draped in the usual sterile fashion. Local anesthesia was achieved with 1% lidocaine . A 7 cm Yueh needle was advanced through a small incision in the left upper quadrant with repeat CT imaging until the needle tip was identified within the fluid collection. The needle was then withdrawn leaving the sheath in position. A short Amplatz wire was then advanced into the fluid collection. The access site was dilated with a 10 French dilator and then a 10 French pigtail catheter was coiled within the fluid  collection. A small sample was obtained and capped for further laboratory evaluation. The catheter was then connected to a JP bulb. Retention suture and sterile dressing applied. A 7 cm Yueh needle was advanced through a small incision in the left lower quadrant with repeat CT imaging until the needle tip was identified within the fluid collection. The needle was then withdrawn leaving the sheath in position. A short Amplatz wire was then advanced into the fluid collection. The access site was dilated with a 10 French dilator and then a 10 French pigtail catheter was coiled within the fluid collection. A small sample was obtained and capped for further laboratory evaluation. The catheter was then connected to a JP bulb. Retention suture and sterile dressing applied. IMPRESSION: Satisfactory placement of left upper quadrant and left lower quadrant abscess drains. The fluid collections appear to be infected hematomas. Both catheter is connected to JP bulbs with instructions for flushing placed in the patient's chart. Sample sent to laboratory for culture and sensitivity. Electronically Signed   By: Cordella Banner   On: 04/10/2024 16:51   CT CHEST ABDOMEN PELVIS W CONTRAST Result Date: 04/10/2024 EXAM: CT CHEST ABDOMEN PELVIS WITH THORACIC AND LUMBAR SPINE RECONSTRUCTIONS 04/10/2024 12:44:37 AM TECHNIQUE: CT of the chest, abdomen, pelvis was performed after the administration of 100 mL of iohexol  (OMNIPAQUE ) 300 MG/ML solution. Multiplanar reformatted images are provided for review, including reconstructed images of the thoracic and lumbar spine. Automated exposure control, iterative reconstruction, and/or weight based adjustment of the mA/kV was utilized to reduce the radiation dose to as low as reasonably achievable. COMPARISON: 03/31/2024 and 03/29/2024 CLINICAL HISTORY: Leukocystosis, stage III  lung cancer, recent pleural effusion with chest tube, recent ex lap with SBR for SBO. *tracking code: Bo* FINDINGS:  CT CHEST: THORACIC AORTA: Mild atherosclerotic calcification within the thoracic aorta. No aortic aneurysm. No acute traumatic injury of the aorta. MEDIASTINUM: Extensive multivessel coronary artery calcification. Global cardiac size within normal limits. No pericardial effusion. Right upper extremity PICC line tip seen within the superior vena cava. 3.8 cm left thyroid  nodule, not well characterized on this examination. Nasogastric tube tip seen within the distal body of the stomach. Esophagus unremarkable. No pathologic thoracic adenopathy. No mediastinal hematoma or pneumomediastinum. No acute traumatic injury to the heart or pericardium. The central airways are clear. LUNGS: Small dependent right pleural effusion has increased since prior examination. Loculated left pleural effusion noted on prior examination has been largely evacuated though a residual posterior collection is seen measuring 2.1 x 6.1 x 5.0 cm (volume 33.4 cm3). Small adjacent free flowing pleural effusion is present. Subtotal collapse of the left lower lobe. Band-like atelectasis within the left upper lobe. No superimposed confluent pulmonary infiltrate. Compressive atelectasis of the right lower lobe. No pneumothorax. No central obstructing lesion. No acute traumatic injury to the lungs. No pulmonary contusion or laceration. CHEST WALL: No acute displaced rib fracture. No chest wall hematoma. CT ABDOMEN AND PELVIS: ABDOMINAL AORTA: Mild aortoiliac atherosclerotic calcification. No acute traumatic injury of the aorta or iliac arteries. HEPATOBILIARY: No acute traumatic injury. SPLEEN: No acute traumatic injury. PANCREAS: No acute traumatic injury. ADRENAL GLANDS: No acute traumatic injury. KIDNEYS: No acute traumatic injury. No hydronephrosis. GI TRACT: Interval partial small bowel resection with resolution of the previously noted small bowel obstruction. Oral contrast now extends into the distal transverse colon. Adjacent to the anastomosis is  a loculated, rim enhancing intraperitoneal fluid collection measuring 3.1 x 3.7 x 8.3 cm (volume 49.7 cm3) concerning for a postoperative abscess. Second similar loculated, rim enhancing peritoneal fluid collection seen anteriorly within the left lower quadrant measuring 1.8 x 3.8 x 10.5 cm (volume 37.3 cm3). PERITONEUM: Loculated, rim enhancing intraperitoneal fluid collections as described in the GI tract section. No free intraperitoneal gas. RETROPERITONEUM: No retroperitoneal hematoma. BLADDER: Foley catheter balloon seen within the incompletely decompressed bladder lumen. REPRODUCTIVE ORGANS: Uterus absent. No adnexal masses. BONES: Osseous structures are age appropriate. No acute bone abnormality. No lytic or blastic bone lesion. No acute traumatic fracture of the pelvis. THORACIC AND LUMBAR SPINE: BONES AND ALIGNMENT: No traumatic fracture or traumatic malalignment. DEGENERATIVE CHANGES: No severe spinal canal stenosis or bony neural foraminal narrowing. SOFT TISSUES: No paraspinal mass or hematoma. IMPRESSION: 1. Interval partial small bowel resection with resolution of the previously noted small bowel obstruction. Oral contrast now extends into the distal transverse colon. 2. Loculated, rim enhancing intraperitoneal fluid collection adjacent to the anastomosis measuring 3.1 x 3.7 x 8.3 cm (approximate volume 49 mL), concerning for a postoperative abscess. 3. Second similar loculated, rim enhancing peritoneal fluid collection in the left lower quadrant measuring 1.8 x 3.8 x 10.5 cm (approximate volume 37 mL). 4. Small dependent right pleural effusion, increased since prior examination. 5. Residual posterior loculated left pleural effusion measuring 2.1 x 6.1 x 5.0 cm (approximate volume 33 mL), with small adjacent free flowing pleural effusion. Electronically signed by: Dorethia Molt MD 04/10/2024 04:44 AM EST RP Workstation: [id number]   US  EKG SITE RITE Result Date: 04/07/2024 If Site Rite image not  attached, placement could not be confirmed due to current cardiac rhythm.  DG CHEST PORT 1 VIEW Result Date: 04/05/2024 EXAM:  1 VIEW(S) XRAY OF THE CHEST 04/05/2024 02:37:00 AM COMPARISON: None available. CLINICAL HISTORY: Dyspnea. FINDINGS: LINES, TUBES AND DEVICES: Left basilar pigtail chest tube has been removed. Nasogastric tube extends into the upper abdomen beyond the margin of the examination. LUNGS AND PLEURA: Small left pleural effusion is present. Ovoid density within the left mid lung zone likely represents fluid within the fissure. Right lung is clear. No pneumothorax. HEART AND MEDIASTINUM: No acute abnormality of the cardiac and mediastinal silhouettes. BONES AND SOFT TISSUES: No acute osseous abnormality. IMPRESSION: 1. Small left pleural effusion. 2. Status post left basilar chest tube removal. No pneumothorax. Electronically signed by: Dorethia Molt MD 04/05/2024 02:56 AM EST RP Workstation: [id number]   DG CHEST PORT 1 VIEW Result Date: 04/04/2024 CLINICAL DATA:  Pleural effusion. EXAM: PORTABLE CHEST 1 VIEW COMPARISON:  04/03/2024 FINDINGS: The cardiopericardial silhouette is within normal limits for size. The NG tube passes into the stomach although the distal tip position is not included on the film. Left chest tube evident with left base atelectasis/infiltrate and small left pleural effusion. No definite left-sided pneumothorax. Right lung clear. Lucency under the right hemidiaphragm is compatible with intraperitoneal free air not unexpected given the history of exploratory laparotomy yesterday. IMPRESSION: 1. Left base atelectasis/infiltrate with small left pleural effusion. No definite left-sided pneumothorax. 2. Intraperitoneal free air under the right hemidiaphragm, not unexpected given the history of exploratory laparotomy yesterday. Electronically Signed   By: Camellia Candle M.D.   On: 04/04/2024 08:41   DG Abd 1 View Result Date: 04/03/2024 EXAM: 1 VIEW XRAY OF THE ABDOMEN  04/03/2024 07:50:00 AM COMPARISON: 04/02/2024 CLINICAL HISTORY: SBO (small bowel obstruction) (HCC) FINDINGS: LINES, TUBES AND DEVICES: Enteric tube is similarly positioned with the tip in the gastric antrum. Partially visualized pigtail drainage catheter in lower left chest. BOWEL: Severe dilation of multiple segments of small bowel in the upper abdomen measuring up to 7.9 cm in diameter. There is enteric contrast present, but most of this contrast appears to be within the small bowel and has not reached the colon since yesterday. SOFT TISSUES: No opaque urinary calculi. BONES: No acute osseous abnormality. IMPRESSION: 1. Persistent severe dilation of multiple segments of small bowel in the mid to upper abdomen measuring up to 7.9 cm in diameter, with enteric contrast present, but not definitively reaching the colon. These findings remain especially worrisome for a closed-loop small bowel obstruction. 2. These results will be called to the ordering clinician or representative by the Radiologist Assistant and communication documented in the PACS or [employer]. Electronically signed by: Rogelia Myers MD 04/03/2024 08:27 AM EST RP Workstation: [id number]   DG CHEST PORT 1 VIEW Result Date: 04/03/2024 EXAM: 1 VIEW(S) XRAY OF THE CHEST 04/03/2024 05:33:00 AM COMPARISON: Portable chest dated 04/02/2024 07:52 AM. CLINICAL HISTORY: [id number] Pneumothorax [id number] Pneumothorax FINDINGS: LINES, TUBES AND DEVICES: Pigtail tube thoracostomy in the lower lateral left chest stable in position, but appears kinked in 2 places outside the chest. NGT is well inside the stomach, but the side hole and tip are out of view. LUNGS AND PLEURA: There is continued improvement and left basilar consolidation/atelectasis with haziness and mild interstitial coarsening remaining. The remaining lungs although hypoinflated appear clear. There is no measurable pneumothorax. There is no substantial pleural effusion. HEART AND MEDIASTINUM: The  cardiomediastinal silhouette is normal. There is aortic atherosclerosis. BONES AND SOFT TISSUES: Thoracic spondylosis. Old right hemithyroidectomy. No acute osseous abnormality. IMPRESSION: 1. No measurable pneumothorax. 2. Pigtail tube thoracostomy in the lower lateral left  chest is stable in position but appears kinked in two places outside the chest. 3. Continued improvement in left basilar consolidation/atelectasis with residual haziness and mild interstitial coarsening. Electronically signed by: Francis Quam MD 04/03/2024 05:55 AM EST RP Workstation: [id number]   DG Abd Portable 1V Result Date: 04/02/2024 CLINICAL DATA:  Small bowel obstruction. EXAM: PORTABLE ABDOMEN - 1 VIEW COMPARISON:  1 day prior FINDINGS: Markedly dilated opacified small bowel loops in the central abdomen and upper pelvis are similar to prior. The does appear to be some gas in the transverse colon and splenic flexure although no colonic contrast evident. NG tube remains in place with catheter either folded back on itself in the gastric antrum or potentially with the tip of the NG tube in a transpyloric position in the proximal duodenum. Left base collapse/consolidation with effusion evident with incomplete visualization of the left chest tube. IMPRESSION: 1. Markedly dilated opacified small bowel loops in the central abdomen and upper pelvis are similar to prior. There does appear to be some gas in the transverse colon and splenic flexure although no colonic contrast evident. Imaging features remain compatible with small bowel obstruction. 2. NG tube remains in place with catheter either folded back on itself in the gastric antrum or potentially with the tip of the NG tube in a transpyloric position in the proximal duodenum. Electronically Signed   By: Camellia Candle M.D.   On: 04/02/2024 08:32   DG CHEST PORT 1 VIEW Result Date: 04/02/2024 CLINICAL DATA:  Chest tube. EXAM: PORTABLE CHEST 1 VIEW COMPARISON:  04/01/2024 FINDINGS:  0752 hours. Interval improvement in aeration of the left lung with decreased opacity over the left mid and lower hemithorax. Left chest tube remains in place without discernible left-sided pneumothorax. Left base collapse/consolidation with small effusion evident. Right lung clear. Cardiopericardial silhouette is at upper limits of normal for size. The NG tube passes into the stomach although the distal tip position is not included on the film. Telemetry leads overlie the chest. IMPRESSION: 1. Interval improvement in aeration of the left lung with decreased opacity over the left mid and lower hemithorax. 2. Left base collapse/consolidation with small effusion. Electronically Signed   By: Camellia Candle M.D.   On: 04/02/2024 08:21   DG Abd Portable 1V-Small Bowel Obstruction Protocol-initial, 8 hr delay Result Date: 04/01/2024 CLINICAL DATA:  Bowel obstruction EXAM: PORTABLE ABDOMEN - 1 VIEW COMPARISON:  03/31/2024, CT 03/31/2024, CT 03/29/2024 FINDINGS: Enteric tube is folded back upon itself distally in the region of the distal stomach with the tube tip directed to the left, cannot exclude kink in the catheter. Contrast within the urinary bladder. Dilute contrast within markedly dilated small bowel, the small bowel is dilated up to 7.8 cm. No contrast in the colon IMPRESSION: 1. Enteric tube is folded back upon itself distally in the region of the distal stomach with the tube tip directed to the left, cannot exclude kink in the catheter. 2. Dilute contrast within markedly dilated small bowel consistent with small bowel obstruction, closed loop physiology not excluded. No contrast in the colon. Electronically Signed   By: Luke Bun M.D.   On: 04/01/2024 20:20   DG CHEST PORT 1 VIEW Result Date: 04/01/2024 CLINICAL DATA:  Post chest tube placement. EXAM: PORTABLE CHEST 1 VIEW COMPARISON:  03/29/2024 FINDINGS: Interval placement of left-sided chest tube over the mid to lower thorax. Enteric tube courses to  the region of the stomach and has tip over the midline which 2 within the distal  stomach versus duodenum. No significant change in opacification over approximately 2/3 of the left hemithorax compatible with known large effusion and associated basilar compressive atelectasis. No pneumothorax. Right lung is clear. Under the exam is unchanged. IMPRESSION: 1. Interval placement of left-sided chest tube over the mid to lower thorax. No pneumothorax. 2. No significant change in opacification over approximately 2/3 of the left hemithorax compatible with known large effusion and associated basilar compressive atelectasis. Electronically Signed   By: Toribio Agreste M.D.   On: 04/01/2024 15:53   CT ABDOMEN PELVIS W CONTRAST Result Date: 03/31/2024 EXAM: CT ABDOMEN AND PELVIS WITH CONTRAST 03/31/2024 07:48:30 PM TECHNIQUE: CT of the abdomen and pelvis was performed with the administration of intravenous contrast. Multiplanar reformatted images are provided for review. Automated exposure control, iterative reconstruction, and/or weight-based adjustment of the mA/kV was utilized to reduce the radiation dose to as low as reasonably achievable. 100 mL of iohexol  (OMNIPAQUE ) 300 MG/ML solution was administered intravenously. COMPARISON: CT Abdomen and Pelvis dated 03/29/2024. Clinical history for the comparison study is abdominal distention, possible obstruction. CLINICAL HISTORY: Bowel obstruction suspected. FINDINGS: LOWER CHEST: Small right-sided pleural effusion is noted. No focal infiltrate is seen. Left lower lobe consolidation is again identified and stable. Large loculated appearing left pleural effusion is again seen. LIVER: The liver is within normal limits. GALLBLADDER AND BILE DUCTS: The gallbladder is decompressed. No biliary ductal dilatation. SPLEEN: The spleen is unremarkable. PANCREAS: The pancreas is within normal limits. ADRENAL GLANDS: The adrenal glands are within normal limits. KIDNEYS, URETERS AND BLADDER:  The kidneys demonstrate a normal enhancement pattern bilaterally. No renal calculi or obstructive changes are seen. No perinephric or periureteral stranding. The bladder is well distended with partially opacified urine from prior CT examination. GI AND BOWEL: The stomach is decompressed by a gastric catheter. The proximal small bowel, however, just beyond the ligament of Treitz shows multiple significantly dilated loops of small bowel up to at least 6 cm in diameter. Transition zones are identified just beyond the ligament of Treitz best seen on image number 40 of series 2 and image number 65 of series 6 proximally with a transition zone distally best seen on image number 40 of series 2 and image number 53 of series 6. This is consistent with small bowel obstruction and these are likely related to adhesions from prior surgery. The more distal small bowel is decompressed. The colon shows no obstructive or inflammatory changes. Scattered fecal material is seen. PERITONEUM AND RETROPERITONEUM: No free fluid is seen. No free air. VASCULATURE: Mild atherosclerotic calcifications of the aorta are noted without aneurysmal dilatation. LYMPH NODES: No lymphadenopathy. REPRODUCTIVE ORGANS: The uterus has been surgically removed. BONES AND SOFT TISSUES: No acute osseous abnormality. IMPRESSION: 1. Small bowel obstruction with proximal transition point just beyond the ligament of Treitz, likely due to adhesions from prior surgery. 2. Stable large loculated left pleural effusion with unchanged left lower lobe consolidation. 3. Small right pleural effusion. Electronically signed by: Oneil Devonshire MD 03/31/2024 08:18 PM EST RP Workstation: [id number]   DG Abd 1 View Result Date: 03/31/2024 CLINICAL DATA:  Enteric catheter placement EXAM: ABDOMEN - 1 VIEW COMPARISON:  03/31/2024 at 12:23 p.m. FINDINGS: Supine frontal view of the lower chest and upper abdomen demonstrates enteric catheter passing below diaphragm, tip and side port  coiled over the gastric body. Persistent gaseous distension of the small bowel consistent with obstruction. Stable eventration of the left hemidiaphragm. IMPRESSION: 1. Enteric catheter tip and side port projecting over the gastric  body. 2. Stable small bowel obstruction. Electronically Signed   By: Ozell Daring M.D.   On: 03/31/2024 15:44   DG Abd 1 View Result Date: 03/31/2024 CLINICAL DATA:  Nausea and vomiting. EXAM: ABDOMEN - 1 VIEW COMPARISON:  10/06/2022, CT 03/29/2024 FINDINGS: Interval change in bowel gas pattern with several air-filled dilated small bowel loops present. There are several air-filled prominent bowel loops in the central abdomen likely representing small bowel loops. There is air and stool over the rectum. No definite free peritoneal air. Remainder of the exam is unchanged. IMPRESSION: Interval change in bowel gas pattern with several air-filled dilated small bowel loops as well as several air-filled prominent bowel loops in the central abdomen likely representing small bowel loops. Findings likely due to early/partial small bowel obstruction and less likely ileus. Electronically Signed   By: Toribio Agreste M.D.   On: 03/31/2024 13:27   CT CHEST ABDOMEN PELVIS W CONTRAST Result Date: 03/29/2024 CLINICAL DATA:  Pleural effusion, malignancy suspected. Pneumonia. * Tracking Code: BO * EXAM: CT CHEST, ABDOMEN, AND PELVIS WITH CONTRAST TECHNIQUE: Multidetector CT imaging of the chest, abdomen and pelvis was performed following the standard protocol during bolus administration of intravenous contrast. RADIATION DOSE REDUCTION: This exam was performed according to the departmental dose-optimization program which includes automated exposure control, adjustment of the mA and/or kV according to patient size and/or use of iterative reconstruction technique. CONTRAST:  OMNIPAQUE  IOHEXOL  300 MG/ML  SOLN COMPARISON:  CT chest 12/27/2023, CT abdomen pelvis 10/09/2023, PET 02/26/2023. FINDINGS:  CT CHEST FINDINGS Cardiovascular: Atherosclerotic calcification of the aorta and coronary arteries. Heart size normal. No pericardial effusion. Left ventricle appears hypertrophied. Small pericardial effusion. Mediastinum/Nodes: No pathologically enlarged mediastinal, hilar or axillary lymph nodes. Thoracic inlet lymph nodes are not enlarged by CT size criteria. 10 mm prepericardiac or left juxtadiaphragmatic lymph node (6/54), new. Partial right thyroidectomy. Esophagus is grossly unremarkable. Lungs/Pleura: Large highly loculated left pleural effusion with compressive atelectasis in the left lung. There is central low-attenuation in the left lower lobe (6/48). Trace right pleural effusion. Airway is unremarkable. Musculoskeletal: Degenerative changes in the spine. No worrisome lytic or sclerotic lesions. Mild compression of the T6 superior endplate, old. CT ABDOMEN PELVIS FINDINGS Hepatobiliary: Liver and gallbladder are unremarkable. No biliary ductal dilatation. Pancreas: Negative. Spleen: Negative. Adrenals/Urinary Tract: Adrenal glands and right kidney are unremarkable. Small low-attenuation lesion in the left kidney, too small to characterize. No specific follow-up necessary. Ureters are decompressed. Bladder is grossly unremarkable. Stomach/Bowel: Stomach and small bowel are unremarkable. Appendix is not readily visualized. Moderate stool burden. Rectal prolapse. Vascular/Lymphatic: Atherosclerotic calcification of the aorta. No pathologically enlarged lymph nodes. Reproductive: Hysterectomy.  No adnexal mass. Other: No free fluid. Musculoskeletal: Degenerative changes in the spine. No worrisome lytic or sclerotic lesions. Grade 1 anterolisthesis of L4 on L5. IMPRESSION: 1. Large highly located left pleural effusion with compressive atelectasis throughout the left lung. 2. Central low-attenuation in the left lower lobe may be due to radiation scarring. Recommend attention on follow-up. 3. Small pericardial  effusion and trace right pleural effusion. 4. Moderate stool burden.  Rectal prolapse. 5. Aortic atherosclerosis (ICD10-I70.0). Coronary artery calcification. Electronically Signed   By: Newell Eke M.D.   On: 03/29/2024 15:15   CT Head Wo Contrast Result Date: 03/29/2024 EXAM: CT HEAD WITHOUT CONTRAST 03/29/2024 01:49:45 PM TECHNIQUE: CT of the head was performed without the administration of intravenous contrast. Automated exposure control, iterative reconstruction, and/or weight based adjustment of the mA/kV was utilized to reduce the  radiation dose to as low as reasonably achievable. COMPARISON: CT head 07/24/2021 CLINICAL HISTORY: Mental status change, unknown cause FINDINGS: BRAIN AND VENTRICLES: No acute hemorrhage. No evidence of acute infarct. No hydrocephalus. No extra-axial collection. No mass effect or midline shift. ORBITS: No acute abnormality. SINUSES: Scattered paranasal sinus mucosal thickening and opacified right frontal sinus. SOFT TISSUES AND SKULL: No skull fracture. IMPRESSION: 1. No acute intracranial abnormality. Electronically signed by: Gilmore Molt MD 03/29/2024 02:07 PM EST RP Workstation: [id number]   DG Chest Port 1 View Result Date: 03/29/2024 EXAM: 1 VIEW(S) XRAY OF THE CHEST 03/29/2024 10:26:00 AM COMPARISON: CT chest from 12/27/2023. CLINICAL HISTORY: Weakness. History of lung cancer. FINDINGS: LUNGS AND PLEURA: There is near complete opacification of the left lung. These findings may reflect large malignant pleural effusion, central hilar mass, lobar atelectasis, and/or pneumonia. Right lung appears clear. No pulmonary edema. No pneumothorax. Consider further evaluation with contrast-enhanced CT of the chest to assess for underlying recurrent tumor and/or malignant pleural effusion. HEART AND MEDIASTINUM: The cardiac and mediastinal silhouettes are obscured by the near complete opacification of the left lung. A central hilar mass is considered in the differential for  the left lung opacification. BONES AND SOFT TISSUES: No acute osseous abnormality. IMPRESSION: 1. Near-complete opacification of the left lung, differential includes large malignant pleural effusion, central hilar mass with obstruction, lobar atelectasis, and/or pneumonia. 2. Recommend contrast-enhanced chest CT for evaluation of recurrent tumor and/or malignant pleural effusion. Electronically signed by: Waddell Calk MD 03/29/2024 10:50 AM EST RP Workstation: [id number]     Subjective: No complaints  Discharge Exam: Vitals:   04/22/24 0616 04/22/24 0733  BP: (!) 141/77   Pulse: 86   Resp: 16   Temp: 98.5 F (36.9 C)   SpO2: 99% 98%   Vitals:   04/21/24 1958 04/21/24 2123 04/22/24 0616 04/22/24 0733  BP:  (!) 101/56 (!) 141/77   Pulse:  80 86   Resp:  16 16   Temp:  98.5 F (36.9 C) 98.5 F (36.9 C)   TempSrc:  Oral Oral   SpO2: 98% 99% 99% 98%  Weight:      Height:        General: Pt is alert, awake, not in acute distress Cardiovascular: RRR, S1/S2 +, no rubs, no gallops Respiratory: CTA bilaterally, no wheezing, no rhonchi Abdominal: Soft, NT, ND, bowel sounds + Extremities: no edema, no cyanosis    The results of significant diagnostics from this hospitalization (including imaging, microbiology, ancillary and laboratory) are listed below for reference.     Microbiology: No results found for this or any previous visit (from the past 240 hours).   Labs: BNP (last 3 results) No results for input(s): BNP in the last 8760 hours. Basic Metabolic Panel: Recent Labs  Lab 04/16/24 0327 04/19/24 0306  NA 134* 134*  K 4.3 3.9  CL 100 100  CO2 29 27  GLUCOSE 111* 78  BUN 23 13  CREATININE 0.56 0.58  CALCIUM  8.2* 8.7*  MG 2.0  --   PHOS 2.5  --    Liver Function Tests: Recent Labs  Lab 04/16/24 0327  AST 17  ALT 7  ALKPHOS 51  BILITOT <0.2  PROT 5.5*  ALBUMIN  2.2*   No results for input(s): LIPASE, AMYLASE in the last 168 hours. No results for  input(s): AMMONIA in the last 168 hours. CBC: Recent Labs  Lab 04/15/24 1508 04/16/24 1438 04/17/24 0337 04/18/24 0337 04/19/24 0306 04/20/24 0259  WBC 24.4* 21.0* 19.4*  14.4* 10.7* 8.6  NEUTROABS 21.5*  --   --   --   --   --   HGB 8.6* 7.5* 7.3* 7.4* 8.2* 7.7*  HCT 26.4* 22.8* 22.3* 23.5* 25.6* 24.1*  MCV 92.6 91.9 92.9 93.6 93.8 92.7  PLT 272 268 278 282 301 302   Cardiac Enzymes: No results for input(s): CKTOTAL, CKMB, CKMBINDEX, TROPONINI in the last 168 hours. BNP: Invalid input(s): POCBNP CBG: Recent Labs  Lab 04/18/24 0650 04/18/24 1118 04/18/24 1701 04/18/24 2109 04/19/24 0740  GLUCAP 95 104* 134* 86 75   D-Dimer No results for input(s): DDIMER in the last 72 hours. Hgb A1c No results for input(s): HGBA1C in the last 72 hours. Lipid Profile No results for input(s): CHOL, HDL, LDLCALC, TRIG, CHOLHDL, LDLDIRECT in the last 72 hours. Thyroid  function studies No results for input(s): TSH, T4TOTAL, T3FREE, THYROIDAB in the last 72 hours.  Invalid input(s): FREET3 Anemia work up No results for input(s): VITAMINB12, FOLATE, FERRITIN, TIBC, IRON , RETICCTPCT in the last 72 hours. Urinalysis    Component Value Date/Time   COLORURINE YELLOW 03/29/2024 1653   APPEARANCEUR HAZY (A) 03/29/2024 1653   LABSPEC >1.046 (H) 03/29/2024 1653   PHURINE 6.0 03/29/2024 1653   GLUCOSEU NEGATIVE 03/29/2024 1653   GLUCOSEU NEGATIVE 07/18/2021 0835   HGBUR NEGATIVE 03/29/2024 1653   BILIRUBINUR NEGATIVE 03/29/2024 1653   KETONESUR 5 (A) 03/29/2024 1653   PROTEINUR NEGATIVE 03/29/2024 1653   UROBILINOGEN 0.2 07/18/2021 0835   NITRITE NEGATIVE 03/29/2024 1653   LEUKOCYTESUR NEGATIVE 03/29/2024 1653   Sepsis Labs Recent Labs  Lab 04/17/24 0337 04/18/24 0337 04/19/24 0306 04/20/24 0259  WBC 19.4* 14.4* 10.7* 8.6   Microbiology No results found for this or any previous visit (from the past 240 hours).   Time  coordinating discharge: Over 35 minutes  SIGNED:   Erle Odell Castor, MD  [facility] 04/22/2024, 9:42 AM Pager   If 7PM-7AM, please contact night-coverage [email or web address] Password [email or web address]

## 2024-04-22 NOTE — Telephone Encounter (Signed)
 Pharmacy Patient Advocate Encounter  Insurance verification completed.    The patient is insured through [employer]. Patient has Medicare and is not eligible for a copay card, but may be able to apply for patient assistance or Medicare RX Payment Plan (Patient Must reach out to their plan, if eligible for payment plan), if available.    Ran test claim for Eliquis 5mg  tablet and the current 30 day co-pay is $47.   This test claim was processed through [employer]- copay amounts may vary at other pharmacies due to [employer], or as the patient moves through the different stages of their insurance plan.

## 2024-04-22 NOTE — Discharge Instructions (Signed)
 Information on my medicine - ELIQUIS  (apixaban )  Why was Eliquis  prescribed for you? 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  What do You need to know about Eliquis  ? The  dose is  ONE 5 mg tablet taken TWICE daily.  Eliquis  may be taken with or without food.   Try to take the dose about the same time in the morning and in the evening. If you have difficulty swallowing the tablet whole please discuss with your pharmacist how to take the medication safely.  Take Eliquis  exactly as prescribed and DO NOT stop taking Eliquis  without talking to the doctor who prescribed the medication.  Stopping may increase your risk of developing a new blood clot.  Refill your prescription before you run out.  After discharge, you should have regular check-up appointments with your healthcare provider that is prescribing your Eliquis .    What do you do if you miss a dose? If a dose of ELIQUIS  is not taken at the scheduled time, take it as soon as possible on the same day and twice-daily administration should be resumed. The dose should not be doubled to make up for a missed dose.  Important Safety Information A possible side effect of Eliquis  is bleeding. You should call your healthcare provider right away if you experience any of the following: ? Bleeding from an injury or your nose that does not stop. ? Unusual colored urine (red or dark brown) or unusual colored stools (red or black). ? Unusual bruising for unknown reasons. ? A serious fall or if you hit your head (even if there is no bleeding).  Some medicines may interact with Eliquis  and might increase your risk of bleeding or clotting while on Eliquis . To help avoid this, consult your healthcare provider or pharmacist prior to using any new prescription or non-prescription  medications, including herbals, vitamins, non-steroidal anti-inflammatory drugs (NSAIDs) and supplements.  This website has more information on Eliquis  (apixaban ): [email or web address] .[email or web address] dena

## 2024-04-22 NOTE — TOC Progression Note (Signed)
 Transition of Care [facility]) - Progression Note    Patient Details  Name: Angelica [name] MRN: [id number] Date of Birth: 04-Dec-1940  [facility] CM/SW Contact  Alfonse JONELLE Rex, RN Phone Number: 04/22/2024, 10:52 AM  Clinical Narrative:   Plan for dc to [facility] tomorrow. [place], admit coordinator notified. Patient's daughter at bedside, agreeable with dc tomorrow.     Expected Discharge Plan: Skilled Nursing Facility Barriers to Discharge: Continued Medical Work up               Expected Discharge Plan and Services In-house Referral: Clinical Social Work   Post Acute Care Choice: Home Health Living arrangements for the past 2 months: Single Family Home Expected Discharge Date: 04/22/24               DME Arranged: N/A DME Agency: NA                   Social Drivers of Health (SDOH) Interventions SDOH Screenings   Food Insecurity: No Food Insecurity (03/29/2024)  Housing: Low Risk  (03/29/2024)  Transportation Needs: No Transportation Needs (03/29/2024)  Utilities: Not At Risk (03/29/2024)  Alcohol Screen: Low Risk  (08/20/2023)  Depression (PHQ2-9): Low Risk  (12/17/2023)  Financial Resource Strain: Low Risk  (12/15/2023)  Physical Activity: Inactive (12/15/2023)  Social Connections: Moderately Integrated (12/15/2023)  Stress: No Stress Concern Present (12/15/2023)  Tobacco Use: Medium Risk (04/03/2024)  Health Literacy: Adequate Health Literacy (08/20/2023)    Readmission Risk Interventions    10/08/2022    2:08 PM  Readmission Risk Prevention Plan  Transportation Screening Complete  PCP or Specialist Appt within 5-7 Days Complete  Home Care Screening Complete  Medication Review (RN CM) Complete

## 2024-04-22 NOTE — Care Management Important Message (Signed)
 Important Message  Patient Details IM Letter given Name: Angelica [name] MRN: [id number] Date of Birth: 03-06-1941   Important Message Given:  Yes - Medicare IM     Melba Ates 04/22/2024, 12:08 PM

## 2024-04-23 DIAGNOSIS — J90 Pleural effusion, not elsewhere classified: Secondary | ICD-10-CM | POA: Diagnosis not present

## 2024-04-23 DIAGNOSIS — D72829 Elevated white blood cell count, unspecified: Secondary | ICD-10-CM | POA: Diagnosis not present

## 2024-04-23 DIAGNOSIS — J189 Pneumonia, unspecified organism: Secondary | ICD-10-CM | POA: Diagnosis not present

## 2024-04-23 DIAGNOSIS — R1084 Generalized abdominal pain: Secondary | ICD-10-CM | POA: Diagnosis not present

## 2024-04-23 NOTE — Discharge Summary (Signed)
 Physician Discharge Summary  Angelica [name] FMW:[id number] DOB: Jul 19, 1940 DOA: 03/29/2024  PCP: Angelica [name]  Admit date: 03/29/2024 Discharge date: 04/23/2024  Admitted From: Home Disposition:  SNF  Recommendations for Outpatient Follow-up:  Follow up with oncology in 1-2 weeks Please obtain BMP/CBC in one week Palliative care to meet with family at facility to discuss goals of care and CODE STATUS.   Home Health:No Equipment/Devices:None  Discharge Condition:Guarded CODE STATUS:Full Diet recommendation: Heart Healthy  Brief/Interim Summary: 83 y.o. female past medical history of essential hypertension, asthma, stage IIIb adenocarcinoma of the lung, hypothyroidism, Alzheimer's dementia history of small bowel obstruction due to adhesion in 2016 and 2021 comes in on 04/08/2024 with weakness and breast swelling with leukocytosis and lactic acid of 2.1, chest x-ray showed complete opacification of the left lung.  CT scan of the abdomen and pelvis showed resolution of previous small bowel.  Loculated rim-enhancing retroperitoneal fluid adjacent to anastomosis concerning for postoperative abscess measuring about 3 x 4 x 8 cm and a second rim-enhancing peritoneal fluid collection measuring 2 x 4 x 10 cm.  With residual loculated left pleural effusion measuring 2 x 6 x 5 cm.  Pulmonary was consulted along with general surgery NG tube was placed underwent lysis adhesion and small bowel resection 04/03/2024 started on TPN with improvement on her oral intake.  TPN has been discontinued.  She developed right upper extremity DVT and started on full dose Lovenox    Discharge Diagnoses:  Principal Problem:   Pleural effusion associated with pulmonary infection vs. recurrent malignancy and sepsis Active Problems:   Weakness   Adenocarcinoma of left lung, stage 3 (HCC)   HTN (hypertension)   Elevated troponin   HLD (hyperlipidemia)   History of stroke   Hypothyroidism   Asthma, severe  persistent, well-controlled (HCC)   Alzheimer's disease (HCC)   GERD (gastroesophageal reflux disease)   Constipation   SBO (small bowel obstruction) (HCC)   Malnutrition of moderate degree   Hypokalemia   Sepsis (HCC)   Prediabetes   Sinus bradycardia   Electrolyte abnormality   Pressure injury of sacral region, stage 2 (HCC)   Acute urinary retention  Multifocal pneumonia with empyema: Pulmonary was consulted chest tube was placed on 04/01/2024 removed on 04/04/2024 she underwent fibrinolysis therapy x 2. Fluid was suggestive of infection cytology negative for malignancy. She completed her course in house. Patient has an extremely poor prognosis; she is an 83 year old with advanced dementia, multiple abdominal surgeries now status status post exploratory laparotomy, with a stage III lung cancer. Palliative care to meet with family and patient and facility to discuss goals of care and CODE STATUS.  Small bowel obstruction status post exploratory laparotomy 04/03/2024 with multiple abscesses. General surgery was consulted she status post left upper quadrant and left lower quadrant fluid collection drain placement on 04/10/2024 remove on 04/17/2024. She was temporarily on TPN and was able to tolerate her diet.[name] PICC line was placed. Abscess culture grew achromobacter, she completed course of antibiotics 08/08/2023. PICC line was discontinued.  Right upper extremity DVT: He was started on Lovenox . PICC line was discontinued she was transition to oral Eliquis which should continue as an outpatient.  Elevated troponins: A demand ischemia she denies any chest pain.  Hyperlipidemia:  Continue statins.  History of strokes: He was discussed with Dr. Welton he is okay to discontinue Plavix .  History of adenocarcinoma of the lung stage III: Diagnosed in 2022 follow-up with oncology as an outpatient.  Alzheimer's dementia: Noted.  GERD: Continue PPI.  Hypothyroidism: Continue  Synthroid .  Generalized weakness/deconditioning: PT evaluated the patient she will need to go to skilled nursing facility.  Discharge Instructions  Discharge Instructions     Diet - low sodium heart healthy   Complete by: As directed    Diet - low sodium heart healthy   Complete by: As directed    Discharge wound care:   Complete by: As directed    Per wound care instructions.   Discharge wound care:   Complete by: As directed    Per wound care instructions.   Increase activity slowly   Complete by: As directed    Increase activity slowly   Complete by: As directed       Allergies as of 04/23/2024       Reactions   Asa Buff (mag [buffered Aspirin] Other (See Comments)   Excessive sweating   Ensure [nutritional Supplements]    Or boost - upset stomach    Fish Allergy Other (See Comments)   Unknown reaction, but allergic. Has tolerated Intralipids in TpN 2022/2025   Other Other (See Comments)   Gelcaps; gel-containing capsules; extended-release medication - upset stomach    Peanut-containing Drug Products Other (See Comments)   From allergy test, Has tolerated Intralipids in TpN 2022/2025   Penicillins Itching   Shellfish Protein-containing Drug Products Other (See Comments)   From allergy test, Has tolerated Intralipids in TpN 2022/2025   Strawberry Extract Nausea And Vomiting, Other (See Comments)   From allergy test         Medication List     STOP taking these medications    amLODipine  10 MG tablet Commonly known as: NORVASC    clopidogrel  75 MG tablet Commonly known as: PLAVIX    fluticasone  50 MCG/ACT nasal spray Commonly known as: FLONASE    gentamicin  ointment 0.1 % Commonly known as: GARAMYCIN    potassium chloride  SA 20 MEQ tablet Commonly known as: KLOR-CON  M       TAKE these medications    acetaminophen  500 MG tablet Commonly known as: TYLENOL  Take 1,000 mg by mouth every 6 (six) hours as needed for moderate pain.   albuterol  108 (90  Base) MCG/ACT inhaler Commonly known as: Ventolin  HFA Inhale 2 puffs into the lungs every 4 (four) hours as needed for wheezing or shortness of breath.   alum & mag hydroxide-simeth 200-200-20 MG/5ML suspension Commonly known as: MAALOX/MYLANTA Take 15 mLs by mouth every 6 (six) hours as needed for indigestion or heartburn.   apixaban 5 MG Tabs tablet Commonly known as: ELIQUIS Take 1 tablet (5 mg total) by mouth 2 (two) times daily.   cetirizine  10 MG tablet Commonly known as: EQ Allergy Relief (Cetirizine ) Take 1 tablet (10 mg total) by mouth daily.   diclofenac  Sodium 1 % Gel Commonly known as: VOLTAREN  Apply 4 g topically 4 (four) times daily. What changed:  when to take this reasons to take this   EPINEPHrine  0.3 mg/0.3 mL Soaj injection Commonly known as: EpiPen  2-Pak Use as directed for severe allergic reaction   Fasenra  30 MG/ML prefilled syringe Generic drug: benralizumab  Inject 1 mL (30 mg total) into the skin every 8 (eight) weeks.   ipratropium 0.06 % nasal spray Commonly known as: ATROVENT  Place 2 sprays into both nostrils 3 (three) times daily as needed for rhinitis.   levothyroxine  50 MCG tablet Commonly known as: SYNTHROID  TAKE 1 TABLET BY MOUTH ONCE DAILY BEFORE BREAKFAST   memantine  10 MG tablet Commonly known as: NAMENDA  Take  1 tablet (10 mg total) by mouth 2 (two) times daily.   montelukast  10 MG tablet Commonly known as: SINGULAIR  Take 1 tablet (10 mg total) by mouth at bedtime.   ondansetron  4 MG disintegrating tablet Commonly known as: ZOFRAN -ODT Take 1 tablet (4 mg total) by mouth every 8 (eight) hours as needed for nausea or vomiting.   oxyCODONE -acetaminophen  5-325 MG tablet Commonly known as: PERCOCET/ROXICET Take 1-2 tablets by mouth every 6 (six) hours as needed for up to 5 days for moderate pain (pain score 4-6) or severe pain (pain score 7-10).   pantoprazole  40 MG tablet Commonly known as: PROTONIX  Take 1 tablet by mouth once  daily   PHILLIPS MILK OF MAGNESIA PO Take 15-30 mLs by mouth daily as needed (for constipation).   pravastatin  80 MG tablet Commonly known as: PRAVACHOL  Take 1 tablet by mouth once daily   Symbicort  80-4.5 MCG/ACT inhaler Generic drug: budesonide -formoterol  INHALE 2 PUFFS BY MOUTH IN THE MORNING               Discharge Care Instructions  (From admission, onward)           Start     Ordered   04/23/24 0000  Discharge wound care:       Comments: Per wound care instructions.   04/23/24 0802   04/22/24 0000  Discharge wound care:       Comments: Per wound care instructions.   04/22/24 9061            Contact information for follow-up providers     Teresa Lonni HERO, [name] Follow up.   Specialties: General Surgery, Colon and Rectal Surgery Why: Arrive 30 mins prior to scheduled appointment time, Please bring ID and insurance card to appointment. Contact information: [address] [place] [address] [phone]              Contact information for after-discharge care     Destination     [facility] [place] .   Service: Skilled Nursing Contact information: [address] [place]  [phone]             Home Medical Care     [facility]) .   Service: Home Health Services Why: Leopoldo will provide PT in the home after discharge. Contact information: [address] [place]  [phone]                    Allergies  Allergen Reactions   Dorethia Buff (Mag [Buffered Aspirin] Other (See Comments)    Excessive sweating   Ensure [Nutritional Supplements]     Or boost - upset stomach    Fish Allergy Other (See Comments)    Unknown reaction, but allergic. Has tolerated Intralipids in TpN 2022/2025   Other Other (See Comments)    Gelcaps; gel-containing capsules; extended-release medication - upset stomach     Peanut-Containing Drug Products Other (See Comments)    From allergy test, Has tolerated Intralipids in TpN 2022/2025    Penicillins Itching   Shellfish Protein-Containing Drug Products Other (See Comments)    From allergy test, Has tolerated Intralipids in TpN 2022/2025    Strawberry Extract Nausea And Vomiting and Other (See Comments)    From allergy test     Consultations: General Surgery Palliative care Pulmonary   Procedures/Studies: DG Sinus/Fist Tube Chk-Non GI Result Date: 04/17/2024 INDICATION: Previous small-bowel obstructions status post laparotomy and  partial small-bowel resection, postop abscess is post drain catheter placement x2. Recent CT shows resolution of extraluminal fluid collections. No further output from drain catheters. Removal requested. EXAM: INJECTION OF LEFT UPPER QUADRANT DRAIN CATHETER UNDER FLUOROSCOPY INJECTION OF LEFT LOWER QUADRANT DRAIN CATHETER UNDER FLUOROSCOPY MEDICATIONS: no periprocedural antibiotics were indicated ANESTHESIA/SEDATION: None required PROCEDURE: Scout radiograph obtained. Contrast injection through the left lower quadrant catheter fills a small extraluminal collection. With aspiration, the contrast is removed. In similar fashion, contrast injection of the left upper quadrant catheter fills a small lateral irregular collection in tracks back along the catheter tubing. With aspiration, most of the contrast is removed. No intraluminal contrast extension is noted from either injection. FLUOROSCOPY TIME:  Radiation Exposure Index (as provided by the fluoroscopic device): 22.5 mGy air Kerma COMPLICATIONS: None immediate. FINDINGS: Resolution of left abdominal abscesses post drain catheter placement. No evidence of fistula to bowel. The drain catheters were cut and removed. IMPRESSION: 1. Resolution of left abdominal abscesses post drain catheter placement. 2. No fistula to bowel. 3. Removal of drain catheters. Electronically Signed   By: JONETTA Faes M.D.   On: 04/17/2024 15:59   US  EKG SITE RITE Result Date: 04/16/2024 If Site Rite image not attached, placement could not be confirmed due to current cardiac rhythm.  CT ABDOMEN PELVIS W CONTRAST Result Date: 04/15/2024 EXAM: CT ABDOMEN AND PELVIS WITH CONTRAST 04/15/2024 04:21:00 PM TECHNIQUE: CT of the abdomen and pelvis was performed with the administration of 100 mL of iohexol  (OMNIPAQUE ) 300 MG/ML solution. Multiplanar reformatted images are provided for review. Automated exposure control, iterative reconstruction, and/or weight-based adjustment of the mA/kV was utilized to reduce the radiation dose to as low as reasonably achievable. COMPARISON: 04/10/2024 CLINICAL HISTORY: s/p drainage of LUQ/LLQ abdominal fluid collections 11/21 FINDINGS: LOWER CHEST: Small bilateral pleural effusions, stable since prior study. Compressive atelectasis in the lower lobes. Coronary artery calcifications. LIVER: The liver is unremarkable. GALLBLADDER AND BILE DUCTS: Gallbladder is unremarkable. No biliary ductal dilatation. SPLEEN: No acute abnormality. PANCREAS: No acute abnormality. ADRENAL GLANDS: No acute abnormality. KIDNEYS, URETERS AND BLADDER: No stones in the kidneys or ureters. No hydronephrosis. No perinephric or periureteral stranding. Foley catheter in the bladder, which is decompressed. GI AND BOWEL: Postoperative changes in left abdominal small bowel loops. Stomach demonstrates no acute abnormality. There is no bowel obstruction. PERITONEUM AND RETROPERITONEUM: Previously seen left fluid collections have been drained with drainage catheters in place. The larger left lower quadrant fluid collection has decreased significantly in size measuring maximally 1.4 cm compared to 3.8 x 3.2 cm previously. The smaller anterior left upper quadrant fluid collection is no longer visible. No free air. VASCULATURE: Aorta is normal in caliber. LYMPH NODES: No lymphadenopathy. REPRODUCTIVE ORGANS: No acute  abnormality. BONES AND SOFT TISSUES: No acute osseous abnormality. No focal soft tissue abnormality. IMPRESSION: 1. Status post drainage of LUQ/LLQ abdominal fluid collections with drainage catheters in place, with marked interval decrease of the larger collection to 1.4 cm and resolution of the smaller collection. 2. Small bilateral pleural effusions, stable since prior study. Electronically signed by: Franky Crease [name] 04/15/2024 10:14 PM EST RP Workstation: [id number]   VAS US  UPPER EXTREMITY VENOUS DUPLEX Result Date: 04/15/2024 UPPER VENOUS STUDY  Patient Name:  Angelica [name]  Date of Exam:   04/14/2024 Medical Rec #: [id number]         Accession #:    [id number] Date of Birth: [birth year]          Patient Gender: F  Patient Age:   74 years Exam Location:  [facility] Procedure:      VAS US  UPPER EXTREMITY VENOUS DUPLEX Referring Phys: DELON YATES --------------------------------------------------------------------------------  Indications: Edema, and lung cancer. Comparison Study: No prior exam of the right upper extremity on file. Performing Technologist: Edilia Elden Appl  Examination Guidelines: A complete evaluation includes B-mode imaging, spectral Doppler, color Doppler, and power Doppler as needed of all accessible portions of each vessel. Bilateral testing is considered an integral part of a complete examination. Limited examinations for reoccurring indications may be performed as noted.  Right Findings: +----------+------------+---------+-----------+----------+---------------+ RIGHT     CompressiblePhasicitySpontaneousProperties    Summary     +----------+------------+---------+-----------+----------+---------------+ IJV                      Yes       Yes                Resistance.   +----------+------------+---------+-----------+----------+---------------+ Subclavian    Full       Yes       No                   IV line      +----------+------------+---------+-----------+----------+---------------+ Axillary      None       No        No               Distal segment  +----------+------------+---------+-----------+----------+---------------+ Brachial      Full       Yes       Yes                              +----------+------------+---------+-----------+----------+---------------+ Radial        Full                                                  +----------+------------+---------+-----------+----------+---------------+ Ulnar         Full                                                  +----------+------------+---------+-----------+----------+---------------+ Cephalic      None       Yes       Yes              At antecubital. +----------+------------+---------+-----------+----------+---------------+ Basilic       Full       Yes       Yes                              +----------+------------+---------+-----------+----------+---------------+ Deep vein thrombosis noted in the distal portion of the axillary vein, all other segments are compressible. Focal superficial vein thrombosis noted in the cephalic vein at the antecubital fossa, all other segments are compressible.  Left Findings: +----------+------------+---------+-----------+----------+-----------+ LEFT      CompressiblePhasicitySpontaneousProperties  Summary   +----------+------------+---------+-----------+----------+-----------+ IJV                      Yes       Yes  Resistance. +----------+------------+---------+-----------+----------+-----------+ Subclavian    Full       Yes       Yes                          +----------+------------+---------+-----------+----------+-----------+  Summary:  Right: Findings consistent with acute deep vein thrombosis involving the right axillary vein. Findings consistent with acute superficial vein thrombosis involving the right cephalic vein.  Left: No evidence of  thrombosis in the subclavian.  *See table(s) above for measurements and observations.  Diagnosing physician: Lonni Gaskins [name] Electronically signed by Lonni Gaskins [name] on 04/15/2024 at 7:49:17 AM.    Final    CT GUIDED PERITONEAL/RETROPERITONEAL FLUID DRAIN BY [facility] CATH Result Date: 04/10/2024 INDICATION: Postoperative abdominal abscesses. EXAM: CT-guided drain placement TECHNIQUE: Multidetector CT imaging of the abdomen was performed following the standard protocol without IV contrast. RADIATION DOSE REDUCTION: This exam was performed according to the departmental dose-optimization program which includes automated exposure control, adjustment of the mA and/or kV according to patient size and/or use of iterative reconstruction technique. MEDICATIONS: The patient is currently admitted to the hospital and receiving intravenous antibiotics. The antibiotics were administered within an appropriate time frame prior to the initiation of the procedure. ANESTHESIA/SEDATION: Moderate (conscious) sedation was employed during this procedure. A total of Versed  1 mg and Fentanyl  50 mcg was administered intravenously by the radiology nurse. Total intra-service moderate Sedation Time: 1. minutes. The patient's level of consciousness and vital signs were monitored continuously by radiology nursing throughout the procedure under my direct supervision. COMPLICATIONS: None immediate. PROCEDURE: Informed written consent was obtained from the patient's daughter/POA after a thorough discussion of the procedural risks, benefits and alternatives. All questions were addressed. Maximal Sterile Barrier Technique was utilized including caps, mask, sterile gowns, sterile gloves, sterile drape, hand hygiene and skin antiseptic. A timeout was performed prior to the initiation of the procedure. Initial axial images of the abdomen redemonstrated 2 fluid collections highly suspicious for abscesses in this postoperative patient. Left upper  quadrant and more midline left lower quadrant. Radiopaque markers placed on the patient's skin and both regions were marked, prepped, and draped in the usual sterile fashion. Local anesthesia was achieved with 1% lidocaine . A 7 cm Yueh needle was advanced through a small incision in the left upper quadrant with repeat CT imaging until the needle tip was identified within the fluid collection. The needle was then withdrawn leaving the sheath in position. A short Amplatz wire was then advanced into the fluid collection. The access site was dilated with a 10 French dilator and then a 10 French pigtail catheter was coiled within the fluid collection. A small sample was obtained and capped for further laboratory evaluation. The catheter was then connected to a JP bulb. Retention suture and sterile dressing applied. A 7 cm Yueh needle was advanced through a small incision in the left lower quadrant with repeat CT imaging until the needle tip was identified within the fluid collection. The needle was then withdrawn leaving the sheath in position. A short Amplatz wire was then advanced into the fluid collection. The access site was dilated with a 10 French dilator and then a 10 French pigtail catheter was coiled within the fluid collection. A small sample was obtained and capped for further laboratory evaluation. The catheter was then connected to a JP bulb. Retention suture and sterile dressing applied. IMPRESSION: Satisfactory placement of left upper quadrant and left lower quadrant abscess drains. The fluid collections appear  to be infected hematomas. Both catheter is connected to JP bulbs with instructions for flushing placed in the patient's chart. Sample sent to laboratory for culture and sensitivity. Electronically Signed   By: Cordella Banner   On: 04/10/2024 16:51   CT GUIDED PERITONEAL/RETROPERITONEAL FLUID DRAIN BY [facility] CATH Result Date: 04/10/2024 INDICATION: Postoperative abdominal abscesses. EXAM:  CT-guided drain placement TECHNIQUE: Multidetector CT imaging of the abdomen was performed following the standard protocol without IV contrast. RADIATION DOSE REDUCTION: This exam was performed according to the departmental dose-optimization program which includes automated exposure control, adjustment of the mA and/or kV according to patient size and/or use of iterative reconstruction technique. MEDICATIONS: The patient is currently admitted to the hospital and receiving intravenous antibiotics. The antibiotics were administered within an appropriate time frame prior to the initiation of the procedure. ANESTHESIA/SEDATION: Moderate (conscious) sedation was employed during this procedure. A total of Versed  1 mg and Fentanyl  50 mcg was administered intravenously by the radiology nurse. Total intra-service moderate Sedation Time: 1. minutes. The patient's level of consciousness and vital signs were monitored continuously by radiology nursing throughout the procedure under my direct supervision. COMPLICATIONS: None immediate. PROCEDURE: Informed written consent was obtained from the patient's daughter/POA after a thorough discussion of the procedural risks, benefits and alternatives. All questions were addressed. Maximal Sterile Barrier Technique was utilized including caps, mask, sterile gowns, sterile gloves, sterile drape, hand hygiene and skin antiseptic. A timeout was performed prior to the initiation of the procedure. Initial axial images of the abdomen redemonstrated 2 fluid collections highly suspicious for abscesses in this postoperative patient. Left upper quadrant and more midline left lower quadrant. Radiopaque markers placed on the patient's skin and both regions were marked, prepped, and draped in the usual sterile fashion. Local anesthesia was achieved with 1% lidocaine . A 7 cm Yueh needle was advanced through a small incision in the left upper quadrant with repeat CT imaging until the needle tip was  identified within the fluid collection. The needle was then withdrawn leaving the sheath in position. A short Amplatz wire was then advanced into the fluid collection. The access site was dilated with a 10 French dilator and then a 10 French pigtail catheter was coiled within the fluid collection. A small sample was obtained and capped for further laboratory evaluation. The catheter was then connected to a JP bulb. Retention suture and sterile dressing applied. A 7 cm Yueh needle was advanced through a small incision in the left lower quadrant with repeat CT imaging until the needle tip was identified within the fluid collection. The needle was then withdrawn leaving the sheath in position. A short Amplatz wire was then advanced into the fluid collection. The access site was dilated with a 10 French dilator and then a 10 French pigtail catheter was coiled within the fluid collection. A small sample was obtained and capped for further laboratory evaluation. The catheter was then connected to a JP bulb. Retention suture and sterile dressing applied. IMPRESSION: Satisfactory placement of left upper quadrant and left lower quadrant abscess drains. The fluid collections appear to be infected hematomas. Both catheter is connected to JP bulbs with instructions for flushing placed in the patient's chart. Sample sent to laboratory for culture and sensitivity. Electronically Signed   By: Cordella Banner   On: 04/10/2024 16:51   CT CHEST ABDOMEN PELVIS W CONTRAST Result Date: 04/10/2024 EXAM: CT CHEST ABDOMEN PELVIS WITH THORACIC AND LUMBAR SPINE RECONSTRUCTIONS 04/10/2024 12:44:37 AM TECHNIQUE: CT of the chest, abdomen, pelvis  was performed after the administration of 100 mL of iohexol  (OMNIPAQUE ) 300 MG/ML solution. Multiplanar reformatted images are provided for review, including reconstructed images of the thoracic and lumbar spine. Automated exposure control, iterative reconstruction, and/or weight based adjustment  of the mA/kV was utilized to reduce the radiation dose to as low as reasonably achievable. COMPARISON: 03/31/2024 and 03/29/2024 CLINICAL HISTORY: Leukocystosis, stage III lung cancer, recent pleural effusion with chest tube, recent ex lap with SBR for SBO. *tracking code: Bo* FINDINGS: CT CHEST: THORACIC AORTA: Mild atherosclerotic calcification within the thoracic aorta. No aortic aneurysm. No acute traumatic injury of the aorta. MEDIASTINUM: Extensive multivessel coronary artery calcification. Global cardiac size within normal limits. No pericardial effusion. Right upper extremity PICC line tip seen within the superior vena cava. 3.8 cm left thyroid  nodule, not well characterized on this examination. Nasogastric tube tip seen within the distal body of the stomach. Esophagus unremarkable. No pathologic thoracic adenopathy. No mediastinal hematoma or pneumomediastinum. No acute traumatic injury to the heart or pericardium. The central airways are clear. LUNGS: Small dependent right pleural effusion has increased since prior examination. Loculated left pleural effusion noted on prior examination has been largely evacuated though a residual posterior collection is seen measuring 2.1 x 6.1 x 5.0 cm (volume 33.4 cm3). Small adjacent free flowing pleural effusion is present. Subtotal collapse of the left lower lobe. Band-like atelectasis within the left upper lobe. No superimposed confluent pulmonary infiltrate. Compressive atelectasis of the right lower lobe. No pneumothorax. No central obstructing lesion. No acute traumatic injury to the lungs. No pulmonary contusion or laceration. CHEST WALL: No acute displaced rib fracture. No chest wall hematoma. CT ABDOMEN AND PELVIS: ABDOMINAL AORTA: Mild aortoiliac atherosclerotic calcification. No acute traumatic injury of the aorta or iliac arteries. HEPATOBILIARY: No acute traumatic injury. SPLEEN: No acute traumatic injury. PANCREAS: No acute traumatic injury. ADRENAL  GLANDS: No acute traumatic injury. KIDNEYS: No acute traumatic injury. No hydronephrosis. GI TRACT: Interval partial small bowel resection with resolution of the previously noted small bowel obstruction. Oral contrast now extends into the distal transverse colon. Adjacent to the anastomosis is a loculated, rim enhancing intraperitoneal fluid collection measuring 3.1 x 3.7 x 8.3 cm (volume 49.7 cm3) concerning for a postoperative abscess. Second similar loculated, rim enhancing peritoneal fluid collection seen anteriorly within the left lower quadrant measuring 1.8 x 3.8 x 10.5 cm (volume 37.3 cm3). PERITONEUM: Loculated, rim enhancing intraperitoneal fluid collections as described in the GI tract section. No free intraperitoneal gas. RETROPERITONEUM: No retroperitoneal hematoma. BLADDER: Foley catheter balloon seen within the incompletely decompressed bladder lumen. REPRODUCTIVE ORGANS: Uterus absent. No adnexal masses. BONES: Osseous structures are age appropriate. No acute bone abnormality. No lytic or blastic bone lesion. No acute traumatic fracture of the pelvis. THORACIC AND LUMBAR SPINE: BONES AND ALIGNMENT: No traumatic fracture or traumatic malalignment. DEGENERATIVE CHANGES: No severe spinal canal stenosis or bony neural foraminal narrowing. SOFT TISSUES: No paraspinal mass or hematoma. IMPRESSION: 1. Interval partial small bowel resection with resolution of the previously noted small bowel obstruction. Oral contrast now extends into the distal transverse colon. 2. Loculated, rim enhancing intraperitoneal fluid collection adjacent to the anastomosis measuring 3.1 x 3.7 x 8.3 cm (approximate volume 49 mL), concerning for a postoperative abscess. 3. Second similar loculated, rim enhancing peritoneal fluid collection in the left lower quadrant measuring 1.8 x 3.8 x 10.5 cm (approximate volume 37 mL). 4. Small dependent right pleural effusion, increased since prior examination. 5. Residual posterior loculated  left pleural effusion measuring 2.1  x 6.1 x 5.0 cm (approximate volume 33 mL), with small adjacent free flowing pleural effusion. Electronically signed by: Dorethia Molt [name] 04/10/2024 04:44 AM EST RP Workstation: [id number]   US  EKG SITE RITE Result Date: 04/07/2024 If Site Rite image not attached, placement could not be confirmed due to current cardiac rhythm.  DG CHEST PORT 1 VIEW Result Date: 04/05/2024 EXAM: 1 VIEW(S) XRAY OF THE CHEST 04/05/2024 02:37:00 AM COMPARISON: None available. CLINICAL HISTORY: Dyspnea. FINDINGS: LINES, TUBES AND DEVICES: Left basilar pigtail chest tube has been removed. Nasogastric tube extends into the upper abdomen beyond the margin of the examination. LUNGS AND PLEURA: Small left pleural effusion is present. Ovoid density within the left mid lung zone likely represents fluid within the fissure. Right lung is clear. No pneumothorax. HEART AND MEDIASTINUM: No acute abnormality of the cardiac and mediastinal silhouettes. BONES AND SOFT TISSUES: No acute osseous abnormality. IMPRESSION: 1. Small left pleural effusion. 2. Status post left basilar chest tube removal. No pneumothorax. Electronically signed by: Dorethia Molt [name] 04/05/2024 02:56 AM EST RP Workstation: [id number]   DG CHEST PORT 1 VIEW Result Date: 04/04/2024 CLINICAL DATA:  Pleural effusion. EXAM: PORTABLE CHEST 1 VIEW COMPARISON:  04/03/2024 FINDINGS: The cardiopericardial silhouette is within normal limits for size. The NG tube passes into the stomach although the distal tip position is not included on the film. Left chest tube evident with left base atelectasis/infiltrate and small left pleural effusion. No definite left-sided pneumothorax. Right lung clear. Lucency under the right hemidiaphragm is compatible with intraperitoneal free air not unexpected given the history of exploratory laparotomy yesterday. IMPRESSION: 1. Left base atelectasis/infiltrate with small left pleural effusion. No definite left-sided  pneumothorax. 2. Intraperitoneal free air under the right hemidiaphragm, not unexpected given the history of exploratory laparotomy yesterday. Electronically Signed   By: Camellia Candle M.D.   On: 04/04/2024 08:41   DG Abd 1 View Result Date: 04/03/2024 EXAM: 1 VIEW XRAY OF THE ABDOMEN 04/03/2024 07:50:00 AM COMPARISON: 04/02/2024 CLINICAL HISTORY: SBO (small bowel obstruction) (HCC) FINDINGS: LINES, TUBES AND DEVICES: Enteric tube is similarly positioned with the tip in the gastric antrum. Partially visualized pigtail drainage catheter in lower left chest. BOWEL: Severe dilation of multiple segments of small bowel in the upper abdomen measuring up to 7.9 cm in diameter. There is enteric contrast present, but most of this contrast appears to be within the small bowel and has not reached the colon since yesterday. SOFT TISSUES: No opaque urinary calculi. BONES: No acute osseous abnormality. IMPRESSION: 1. Persistent severe dilation of multiple segments of small bowel in the mid to upper abdomen measuring up to 7.9 cm in diameter, with enteric contrast present, but not definitively reaching the colon. These findings remain especially worrisome for a closed-loop small bowel obstruction. 2. These results will be called to the ordering clinician or representative by the Radiologist Assistant and communication documented in the PACS or [employer]. Electronically signed by: Rogelia Myers [name] 04/03/2024 08:27 AM EST RP Workstation: [id number]   DG CHEST PORT 1 VIEW Result Date: 04/03/2024 EXAM: 1 VIEW(S) XRAY OF THE CHEST 04/03/2024 05:33:00 AM COMPARISON: Portable chest dated 04/02/2024 07:52 AM. CLINICAL HISTORY: [id number] Pneumothorax [id number] Pneumothorax FINDINGS: LINES, TUBES AND DEVICES: Pigtail tube thoracostomy in the lower lateral left chest stable in position, but appears kinked in 2 places outside the chest. NGT is well inside the stomach, but the side hole and tip are out of view. LUNGS AND PLEURA:  There is continued improvement and left basilar consolidation/atelectasis  with haziness and mild interstitial coarsening remaining. The remaining lungs although hypoinflated appear clear. There is no measurable pneumothorax. There is no substantial pleural effusion. HEART AND MEDIASTINUM: The cardiomediastinal silhouette is normal. There is aortic atherosclerosis. BONES AND SOFT TISSUES: Thoracic spondylosis. Old right hemithyroidectomy. No acute osseous abnormality. IMPRESSION: 1. No measurable pneumothorax. 2. Pigtail tube thoracostomy in the lower lateral left chest is stable in position but appears kinked in two places outside the chest. 3. Continued improvement in left basilar consolidation/atelectasis with residual haziness and mild interstitial coarsening. Electronically signed by: Francis Quam [name] 04/03/2024 05:55 AM EST RP Workstation: [id number]   DG Abd Portable 1V Result Date: 04/02/2024 CLINICAL DATA:  Small bowel obstruction. EXAM: PORTABLE ABDOMEN - 1 VIEW COMPARISON:  1 day prior FINDINGS: Markedly dilated opacified small bowel loops in the central abdomen and upper pelvis are similar to prior. The does appear to be some gas in the transverse colon and splenic flexure although no colonic contrast evident. NG tube remains in place with catheter either folded back on itself in the gastric antrum or potentially with the tip of the NG tube in a transpyloric position in the proximal duodenum. Left base collapse/consolidation with effusion evident with incomplete visualization of the left chest tube. IMPRESSION: 1. Markedly dilated opacified small bowel loops in the central abdomen and upper pelvis are similar to prior. There does appear to be some gas in the transverse colon and splenic flexure although no colonic contrast evident. Imaging features remain compatible with small bowel obstruction. 2. NG tube remains in place with catheter either folded back on itself in the gastric antrum or potentially  with the tip of the NG tube in a transpyloric position in the proximal duodenum. Electronically Signed   By: Camellia Candle M.D.   On: 04/02/2024 08:32   DG CHEST PORT 1 VIEW Result Date: 04/02/2024 CLINICAL DATA:  Chest tube. EXAM: PORTABLE CHEST 1 VIEW COMPARISON:  04/01/2024 FINDINGS: 0752 hours. Interval improvement in aeration of the left lung with decreased opacity over the left mid and lower hemithorax. Left chest tube remains in place without discernible left-sided pneumothorax. Left base collapse/consolidation with small effusion evident. Right lung clear. Cardiopericardial silhouette is at upper limits of normal for size. The NG tube passes into the stomach although the distal tip position is not included on the film. Telemetry leads overlie the chest. IMPRESSION: 1. Interval improvement in aeration of the left lung with decreased opacity over the left mid and lower hemithorax. 2. Left base collapse/consolidation with small effusion. Electronically Signed   By: Camellia Candle M.D.   On: 04/02/2024 08:21   DG Abd Portable 1V-Small Bowel Obstruction Protocol-initial, 8 hr delay Result Date: 04/01/2024 CLINICAL DATA:  Bowel obstruction EXAM: PORTABLE ABDOMEN - 1 VIEW COMPARISON:  03/31/2024, CT 03/31/2024, CT 03/29/2024 FINDINGS: Enteric tube is folded back upon itself distally in the region of the distal stomach with the tube tip directed to the left, cannot exclude kink in the catheter. Contrast within the urinary bladder. Dilute contrast within markedly dilated small bowel, the small bowel is dilated up to 7.8 cm. No contrast in the colon IMPRESSION: 1. Enteric tube is folded back upon itself distally in the region of the distal stomach with the tube tip directed to the left, cannot exclude kink in the catheter. 2. Dilute contrast within markedly dilated small bowel consistent with small bowel obstruction, closed loop physiology not excluded. No contrast in the colon. Electronically Signed   By: Luke Bun  M.D.   On: 04/01/2024 20:20   DG CHEST PORT 1 VIEW Result Date: 04/01/2024 CLINICAL DATA:  Post chest tube placement. EXAM: PORTABLE CHEST 1 VIEW COMPARISON:  03/29/2024 FINDINGS: Interval placement of left-sided chest tube over the mid to lower thorax. Enteric tube courses to the region of the stomach and has tip over the midline which 2 within the distal stomach versus duodenum. No significant change in opacification over approximately 2/3 of the left hemithorax compatible with known large effusion and associated basilar compressive atelectasis. No pneumothorax. Right lung is clear. Under the exam is unchanged. IMPRESSION: 1. Interval placement of left-sided chest tube over the mid to lower thorax. No pneumothorax. 2. No significant change in opacification over approximately 2/3 of the left hemithorax compatible with known large effusion and associated basilar compressive atelectasis. Electronically Signed   By: Toribio Agreste M.D.   On: 04/01/2024 15:53   CT ABDOMEN PELVIS W CONTRAST Result Date: 03/31/2024 EXAM: CT ABDOMEN AND PELVIS WITH CONTRAST 03/31/2024 07:48:30 PM TECHNIQUE: CT of the abdomen and pelvis was performed with the administration of intravenous contrast. Multiplanar reformatted images are provided for review. Automated exposure control, iterative reconstruction, and/or weight-based adjustment of the mA/kV was utilized to reduce the radiation dose to as low as reasonably achievable. 100 mL of iohexol  (OMNIPAQUE ) 300 MG/ML solution was administered intravenously. COMPARISON: CT Abdomen and Pelvis dated 03/29/2024. Clinical history for the comparison study is abdominal distention, possible obstruction. CLINICAL HISTORY: Bowel obstruction suspected. FINDINGS: LOWER CHEST: Small right-sided pleural effusion is noted. No focal infiltrate is seen. Left lower lobe consolidation is again identified and stable. Large loculated appearing left pleural effusion is again seen. LIVER: The liver  is within normal limits. GALLBLADDER AND BILE DUCTS: The gallbladder is decompressed. No biliary ductal dilatation. SPLEEN: The spleen is unremarkable. PANCREAS: The pancreas is within normal limits. ADRENAL GLANDS: The adrenal glands are within normal limits. KIDNEYS, URETERS AND BLADDER: The kidneys demonstrate a normal enhancement pattern bilaterally. No renal calculi or obstructive changes are seen. No perinephric or periureteral stranding. The bladder is well distended with partially opacified urine from prior CT examination. GI AND BOWEL: The stomach is decompressed by a gastric catheter. The proximal small bowel, however, just beyond the ligament of Treitz shows multiple significantly dilated loops of small bowel up to at least 6 cm in diameter. Transition zones are identified just beyond the ligament of Treitz best seen on image number 40 of series 2 and image number 65 of series 6 proximally with a transition zone distally best seen on image number 40 of series 2 and image number 53 of series 6. This is consistent with small bowel obstruction and these are likely related to adhesions from prior surgery. The more distal small bowel is decompressed. The colon shows no obstructive or inflammatory changes. Scattered fecal material is seen. PERITONEUM AND RETROPERITONEUM: No free fluid is seen. No free air. VASCULATURE: Mild atherosclerotic calcifications of the aorta are noted without aneurysmal dilatation. LYMPH NODES: No lymphadenopathy. REPRODUCTIVE ORGANS: The uterus has been surgically removed. BONES AND SOFT TISSUES: No acute osseous abnormality. IMPRESSION: 1. Small bowel obstruction with proximal transition point just beyond the ligament of Treitz, likely due to adhesions from prior surgery. 2. Stable large loculated left pleural effusion with unchanged left lower lobe consolidation. 3. Small right pleural effusion. Electronically signed by: Oneil Devonshire [name] 03/31/2024 08:18 PM EST RP Workstation:  [id number] View Result Date: 03/31/2024 CLINICAL DATA:  Enteric catheter placement EXAM:  ABDOMEN - 1 VIEW COMPARISON:  03/31/2024 at 12:23 p.m. FINDINGS: Supine frontal view of the lower chest and upper abdomen demonstrates enteric catheter passing below diaphragm, tip and side port coiled over the gastric body. Persistent gaseous distension of the small bowel consistent with obstruction. Stable eventration of the left hemidiaphragm. IMPRESSION: 1. Enteric catheter tip and side port projecting over the gastric body. 2. Stable small bowel obstruction. Electronically Signed   By: Ozell Daring M.D.   On: 03/31/2024 15:44   DG Abd 1 View Result Date: 03/31/2024 CLINICAL DATA:  Nausea and vomiting. EXAM: ABDOMEN - 1 VIEW COMPARISON:  10/06/2022, CT 03/29/2024 FINDINGS: Interval change in bowel gas pattern with several air-filled dilated small bowel loops present. There are several air-filled prominent bowel loops in the central abdomen likely representing small bowel loops. There is air and stool over the rectum. No definite free peritoneal air. Remainder of the exam is unchanged. IMPRESSION: Interval change in bowel gas pattern with several air-filled dilated small bowel loops as well as several air-filled prominent bowel loops in the central abdomen likely representing small bowel loops. Findings likely due to early/partial small bowel obstruction and less likely ileus. Electronically Signed   By: Toribio Agreste M.D.   On: 03/31/2024 13:27   CT CHEST ABDOMEN PELVIS W CONTRAST Result Date: 03/29/2024 CLINICAL DATA:  Pleural effusion, malignancy suspected. Pneumonia. * Tracking Code: BO * EXAM: CT CHEST, ABDOMEN, AND PELVIS WITH CONTRAST TECHNIQUE: Multidetector CT imaging of the chest, abdomen and pelvis was performed following the standard protocol during bolus administration of intravenous contrast. RADIATION DOSE REDUCTION: This exam was performed according to the departmental dose-optimization  program which includes automated exposure control, adjustment of the mA and/or kV according to patient size and/or use of iterative reconstruction technique. CONTRAST:  OMNIPAQUE  IOHEXOL  300 MG/ML  SOLN COMPARISON:  CT chest 12/27/2023, CT abdomen pelvis 10/09/2023, PET 02/26/2023. FINDINGS: CT CHEST FINDINGS Cardiovascular: Atherosclerotic calcification of the aorta and coronary arteries. Heart size normal. No pericardial effusion. Left ventricle appears hypertrophied. Small pericardial effusion. Mediastinum/Nodes: No pathologically enlarged mediastinal, hilar or axillary lymph nodes. Thoracic inlet lymph nodes are not enlarged by CT size criteria. 10 mm prepericardiac or left juxtadiaphragmatic lymph node (6/54), new. Partial right thyroidectomy. Esophagus is grossly unremarkable. Lungs/Pleura: Large highly loculated left pleural effusion with compressive atelectasis in the left lung. There is central low-attenuation in the left lower lobe (6/48). Trace right pleural effusion. Airway is unremarkable. Musculoskeletal: Degenerative changes in the spine. No worrisome lytic or sclerotic lesions. Mild compression of the T6 superior endplate, old. CT ABDOMEN PELVIS FINDINGS Hepatobiliary: Liver and gallbladder are unremarkable. No biliary ductal dilatation. Pancreas: Negative. Spleen: Negative. Adrenals/Urinary Tract: Adrenal glands and right kidney are unremarkable. Small low-attenuation lesion in the left kidney, too small to characterize. No specific follow-up necessary. Ureters are decompressed. Bladder is grossly unremarkable. Stomach/Bowel: Stomach and small bowel are unremarkable. Appendix is not readily visualized. Moderate stool burden. Rectal prolapse. Vascular/Lymphatic: Atherosclerotic calcification of the aorta. No pathologically enlarged lymph nodes. Reproductive: Hysterectomy.  No adnexal mass. Other: No free fluid. Musculoskeletal: Degenerative changes in the spine. No worrisome lytic or sclerotic  lesions. Grade 1 anterolisthesis of L4 on L5. IMPRESSION: 1. Large highly located left pleural effusion with compressive atelectasis throughout the left lung. 2. Central low-attenuation in the left lower lobe may be due to radiation scarring. Recommend attention on follow-up. 3. Small pericardial effusion and trace right pleural effusion. 4. Moderate stool burden.  Rectal prolapse. 5. Aortic atherosclerosis (ICD10-I70.0).  Coronary artery calcification. Electronically Signed   By: Newell Eke M.D.   On: 03/29/2024 15:15   CT Head Wo Contrast Result Date: 03/29/2024 EXAM: CT HEAD WITHOUT CONTRAST 03/29/2024 01:49:45 PM TECHNIQUE: CT of the head was performed without the administration of intravenous contrast. Automated exposure control, iterative reconstruction, and/or weight based adjustment of the mA/kV was utilized to reduce the radiation dose to as low as reasonably achievable. COMPARISON: CT head 07/24/2021 CLINICAL HISTORY: Mental status change, unknown cause FINDINGS: BRAIN AND VENTRICLES: No acute hemorrhage. No evidence of acute infarct. No hydrocephalus. No extra-axial collection. No mass effect or midline shift. ORBITS: No acute abnormality. SINUSES: Scattered paranasal sinus mucosal thickening and opacified right frontal sinus. SOFT TISSUES AND SKULL: No skull fracture. IMPRESSION: 1. No acute intracranial abnormality. Electronically signed by: Gilmore Molt [name] 03/29/2024 02:07 PM EST RP Workstation: [id number]   DG Chest Port 1 View Result Date: 03/29/2024 EXAM: 1 VIEW(S) XRAY OF THE CHEST 03/29/2024 10:26:00 AM COMPARISON: CT chest from 12/27/2023. CLINICAL HISTORY: Weakness. History of lung cancer. FINDINGS: LUNGS AND PLEURA: There is near complete opacification of the left lung. These findings may reflect large malignant pleural effusion, central hilar mass, lobar atelectasis, and/or pneumonia. Right lung appears clear. No pulmonary edema. No pneumothorax. Consider further evaluation with  contrast-enhanced CT of the chest to assess for underlying recurrent tumor and/or malignant pleural effusion. HEART AND MEDIASTINUM: The cardiac and mediastinal silhouettes are obscured by the near complete opacification of the left lung. A central hilar mass is considered in the differential for the left lung opacification. BONES AND SOFT TISSUES: No acute osseous abnormality. IMPRESSION: 1. Near-complete opacification of the left lung, differential includes large malignant pleural effusion, central hilar mass with obstruction, lobar atelectasis, and/or pneumonia. 2. Recommend contrast-enhanced chest CT for evaluation of recurrent tumor and/or malignant pleural effusion. Electronically signed by: Waddell Calk [name] 03/29/2024 10:50 AM EST RP Workstation: [id number]     Subjective: No complaints  Discharge Exam: Vitals:   04/22/24 2059 04/23/24 0547  BP: (!) 132/91 (!) 139/117  Pulse: 87 93  Resp: 18 18  Temp: 98.3 F (36.8 C) (!) 97.5 F (36.4 C)  SpO2: 100% 99%   Vitals:   04/22/24 1312 04/22/24 2003 04/22/24 2059 04/23/24 0547  BP: 133/81  (!) 132/91 (!) 139/117  Pulse: 83  87 93  Resp: 16  18 18   Temp: 98 F (36.7 C)  98.3 F (36.8 C) (!) 97.5 F (36.4 C)  TempSrc: Oral  Oral Oral  SpO2: 99% 98% 100% 99%  Weight:      Height:        General: Pt is alert, awake, not in acute distress Cardiovascular: RRR, S1/S2 +, no rubs, no gallops Respiratory: CTA bilaterally, no wheezing, no rhonchi Abdominal: Soft, NT, ND, bowel sounds + Extremities: no edema, no cyanosis    The results of significant diagnostics from this hospitalization (including imaging, microbiology, ancillary and laboratory) are listed below for reference.     Microbiology: No results found for this or any previous visit (from the past 240 hours).   Labs: BNP (last 3 results) No results for input(s): BNP in the last 8760 hours. Basic Metabolic Panel: Recent Labs  Lab 04/19/24 0306  NA 134*  K 3.9   CL 100  CO2 27  GLUCOSE 78  BUN 13  CREATININE 0.58  CALCIUM  8.7*   Liver Function Tests: No results for input(s): AST, ALT, ALKPHOS, BILITOT, PROT, ALBUMIN  in the last 168 hours.  No results  for input(s): LIPASE, AMYLASE in the last 168 hours. No results for input(s): AMMONIA in the last 168 hours. CBC: Recent Labs  Lab 04/16/24 1438 04/17/24 0337 04/18/24 0337 04/19/24 0306 04/20/24 0259  WBC 21.0* 19.4* 14.4* 10.7* 8.6  HGB 7.5* 7.3* 7.4* 8.2* 7.7*  HCT 22.8* 22.3* 23.5* 25.6* 24.1*  MCV 91.9 92.9 93.6 93.8 92.7  PLT 268 278 282 301 302   Cardiac Enzymes: No results for input(s): CKTOTAL, CKMB, CKMBINDEX, TROPONINI in the last 168 hours. BNP: Invalid input(s): POCBNP CBG: Recent Labs  Lab 04/18/24 0650 04/18/24 1118 04/18/24 1701 04/18/24 2109 04/19/24 0740  GLUCAP 95 104* 134* 86 75   D-Dimer No results for input(s): DDIMER in the last 72 hours. Hgb A1c No results for input(s): HGBA1C in the last 72 hours. Lipid Profile No results for input(s): CHOL, HDL, LDLCALC, TRIG, CHOLHDL, LDLDIRECT in the last 72 hours. Thyroid  function studies No results for input(s): TSH, T4TOTAL, T3FREE, THYROIDAB in the last 72 hours.  Invalid input(s): FREET3 Anemia work up No results for input(s): VITAMINB12, FOLATE, FERRITIN, TIBC, IRON , RETICCTPCT in the last 72 hours. Urinalysis    Component Value Date/Time   COLORURINE YELLOW 03/29/2024 1653   APPEARANCEUR HAZY (A) 03/29/2024 1653   LABSPEC >1.046 (H) 03/29/2024 1653   PHURINE 6.0 03/29/2024 1653   GLUCOSEU NEGATIVE 03/29/2024 1653   GLUCOSEU NEGATIVE 07/18/2021 0835   HGBUR NEGATIVE 03/29/2024 1653   BILIRUBINUR NEGATIVE 03/29/2024 1653   KETONESUR 5 (A) 03/29/2024 1653   PROTEINUR NEGATIVE 03/29/2024 1653   UROBILINOGEN 0.2 07/18/2021 0835   NITRITE NEGATIVE 03/29/2024 1653   LEUKOCYTESUR NEGATIVE 03/29/2024 1653   Sepsis Labs Recent Labs   Lab 04/17/24 0337 04/18/24 0337 04/19/24 0306 04/20/24 0259  WBC 19.4* 14.4* 10.7* 8.6   Microbiology No results found for this or any previous visit (from the past 240 hours).   Time coordinating discharge: Over 35 minutes  SIGNED:   Erle Odell Castor, [name]  [facility] 04/23/2024, 8:03 AM Pager   If 7PM-7AM, please contact night-coverage [email or web address] Password [email or web address]

## 2024-04-23 NOTE — Progress Notes (Signed)
 Patient and packet was picked up by [facility] and taken to [facility]. Patient was stable for discharge. Report was given to the RN, Nargett, who will be receiving Ms. Mutz, and all questions were answered.

## 2024-04-23 NOTE — TOC Transition Note (Signed)
 Transition of Care [facility]) - Discharge Note   Patient Details  Name: Angelica [name] MRN: [id number] Date of Birth: [birth year]  [facility]) CM/SW Contact:  Alfonse JONELLE Rex, RN Phone Number: 04/23/2024, 11:06 AM   Clinical Narrative:   DC to SNF, [facility], RM 701p. [facility] for transport. No further INPT CM needs identified at this time.     Final next level of care: Skilled Nursing Facility Barriers to Discharge: Barriers Resolved   Patient Goals and CMS Choice Patient states their goals for this hospitalization and ongoing recovery are:: Get HHPT CMS Medicare.gov Compare Post Acute Care list provided to:: Patient Represenative (must comment) Candra, Wegner (Daughter)  [phone] [facility] Phone)) Choice offered to / list presented to : Adult Children [place] ownership interest in [facility].provided to:: Adult Children    Discharge Placement              Patient chooses bed at: [facility] Patient to be transferred to facility by: PTAR Name of family member notified: family at bedside Patient and family notified of of transfer: 04/23/24  Discharge Plan and Services Additional resources added to the After Visit Summary for   In-house Referral: Clinical Social Work   Post Acute Care Choice: Home Health          DME Arranged: N/A DME Agency: NA                  Social Drivers of Health (SDOH) Interventions SDOH Screenings   Food Insecurity: No Food Insecurity (03/29/2024)  Housing: Low Risk  (03/29/2024)  Transportation Needs: No Transportation Needs (03/29/2024)  Utilities: Not At Risk (03/29/2024)  Alcohol Screen: Low Risk  (08/20/2023)  Depression (PHQ2-9): Low Risk  (12/17/2023)  Financial Resource Strain: Low Risk  (12/15/2023)  Physical Activity: Inactive (12/15/2023)  Social Connections: Moderately Integrated (12/15/2023)  Stress: No Stress Concern Present (12/15/2023)  Tobacco Use: Medium Risk (04/03/2024)  Health Literacy:  Adequate Health Literacy (08/20/2023)     Readmission Risk Interventions    04/23/2024   11:04 AM 10/08/2022    2:08 PM  Readmission Risk Prevention Plan  Transportation Screening Complete Complete  PCP or Specialist Appt within 5-7 Days  Complete  PCP or Specialist Appt within 3-5 Days Complete   Home Care Screening  Complete  Medication Review (RN CM)  Complete  Social Work Consult for Recovery Care Planning/Counseling Complete   Palliative Care Screening Not Applicable   Medication Review Ocean[employer]) Complete

## 2024-04-23 NOTE — Progress Notes (Signed)
 20 Days Post-Op   Subjective/Chief Complaint: No issues Tol diet +BMs   Objective: Vital signs in last 24 hours: Temp:  [97.5 F (36.4 C)-98.3 F (36.8 C)] 97.5 F (36.4 C) (12/04 0547) Pulse Rate:  [83-93] 93 (12/04 0547) Resp:  [16-18] 18 (12/04 0547) BP: (132-139)/(81-117) 139/117 (12/04 0547) SpO2:  [96 %-100 %] 96 % (12/04 0824) Last BM Date : 04/22/24  Intake/Output from previous day: 12/03 0701 - 12/04 0700 In: 1000 [P.O.:1000] Out: 1450 [Urine:1450] Intake/Output this shift: No intake/output data recorded.  Exam: Awake and alert Looks comfortable Abdomen soft, midline wound packing removed, wound granulating well with some fibrinous exudate; single stable removed from superior aspect of wound.   Anti-infectives: Anti-infectives (From admission, onward)    Start     Dose/Rate Route Frequency Ordered Stop   04/16/24 1800  meropenem  (MERREM ) 1 g in sodium chloride  0.9 % 100 mL IVPB        1 g 200 mL/hr over 30 Minutes Intravenous Every 12 hours 04/16/24 1056 04/22/24 1750   04/15/24 1400  meropenem  (MERREM ) 1 g in sodium chloride  0.9 % 100 mL IVPB  Status:  Discontinued        1 g 200 mL/hr over 30 Minutes Intravenous Every 8 hours 04/15/24 0915 04/16/24 1056   04/06/24 1300  metroNIDAZOLE  (FLAGYL ) IVPB 500 mg  Status:  Discontinued        500 mg 100 mL/hr over 60 Minutes Intravenous Every 12 hours 04/06/24 1217 04/15/24 0915   04/03/24 1330  metroNIDAZOLE  (FLAGYL ) IVPB 500 mg        500 mg 100 mL/hr over 60 Minutes Intravenous On call to O.R. 04/03/24 1242 04/03/24 1507   03/30/24 2200  vancomycin  (VANCOREADY) IVPB 750 mg/150 mL  Status:  Discontinued        750 mg 150 mL/hr over 60 Minutes Intravenous Every 24 hours 03/29/24 1620 04/03/24 1310   03/30/24 0400  ceFEPIme  (MAXIPIME ) 2 g in sodium chloride  0.9 % 100 mL IVPB  Status:  Discontinued        2 g 200 mL/hr over 30 Minutes Intravenous Every 12 hours 03/29/24 1620 04/15/24 0915   03/29/24 1530   vancomycin  (VANCOCIN ) IVPB 1000 mg/200 mL premix        1,000 mg 200 mL/hr over 60 Minutes Intravenous  Once 03/29/24 1517 03/30/24 0228   03/29/24 1530  ceFEPIme  (MAXIPIME ) 2 g in sodium chloride  0.9 % 100 mL IVPB        2 g 200 mL/hr over 30 Minutes Intravenous  Once 03/29/24 1518 03/29/24 1724       Assessment/Plan: POD 20 s/p ex lap with SBR, Dr. Teresa 11/14  Wound stable, continue moist-to-dry dressing changes at home.  Noted plans for discharge today. Follow up provided 12/23 with Dr. Teresa    LOS: 25 days    Almarie RAMAN [facility] 04/23/2024

## 2024-04-23 NOTE — Plan of Care (Signed)
   Problem: Activity: Goal: Risk for activity intolerance will decrease Outcome: Progressing

## 2024-04-30 LAB — FUNGUS CULTURE WITH STAIN

## 2024-04-30 LAB — FUNGUS CULTURE RESULT

## 2024-04-30 LAB — FUNGAL ORGANISM REFLEX

## 2024-05-06 ENCOUNTER — Ambulatory Visit

## 2024-05-06 DIAGNOSIS — J4550 Severe persistent asthma, uncomplicated: Secondary | ICD-10-CM

## 2024-05-07 LAB — SUSCEPTIBILITY RESULT

## 2024-05-07 LAB — SUSCEPTIBILITY, AER + ANAEROB

## 2024-05-08 LAB — AEROBIC/ANAEROBIC CULTURE W GRAM STAIN (SURGICAL/DEEP WOUND)

## 2024-05-11 ENCOUNTER — Telehealth: Payer: Self-pay

## 2024-05-11 NOTE — Transitions of Care (Post Inpatient/ED Visit) (Signed)
 "  05/11/2024  Name: Angelica [name] MRN: [id number] DOB: 05-16-1941  Today's TOC FU Call Status: Today's TOC FU Call Status:: Successful TOC FU Call Completed TOC FU Call Complete Date: 05/11/24  Patient's Name and Date of Birth confirmed. Name, DOB  Transition Care Management Follow-up Telephone Call Date of Discharge: 05/10/24 Discharge Facility: Other [facility]) Name of Other ([facility]) Discharge Facility: [facility] Type of Discharge: Inpatient Admission Primary Inpatient Discharge Diagnosis:: pneumonia How have you been since you were released from the hospital?: Better Any questions or concerns?: No  Items Reviewed: Did you receive and understand the discharge instructions provided?: Yes Medications obtained,verified, and reconciled?: Yes (Medications Reviewed) Any new allergies since your discharge?: No Dietary orders reviewed?: Yes Do you have support at home?: Yes People in Home [RPT]: child(ren), adult  Medications Reviewed Today: Medications Reviewed Today     Reviewed by Emmitt Pan, LPN (Licensed Practical Nurse) on 05/11/24 at 1255  Med List Status: <None>   Medication Order Taking? Sig Documenting Provider Last Dose Status Informant  acetaminophen  (TYLENOL ) 500 MG tablet [id number] Yes Take 1,000 mg by mouth every 6 (six) hours as needed for moderate pain. [provider]  Active Family Member, Pharmacy Records  albuterol  (VENTOLIN  HFA) 108 [phone] Base) MCG/ACT inhaler [id number] Yes Inhale 2 puffs into the lungs every 4 (four) hours as needed for wheezing or shortness of breath. Jeneal Danita Macintosh, MD  Active Family Member, Pharmacy Records  alum & mag hydroxide-simeth (MAALOX/MYLANTA) 200-200-20 MG/5ML suspension [id number] Yes Take 15 mLs by mouth every 6 (six) hours as needed for indigestion or heartburn. [provider]  Active Family Member, Pharmacy Records  apixaban  (ELIQUIS ) 5 MG TABS tablet [id number] Yes Take 1 tablet (5 mg  total) by mouth 2 (two) times daily. Odell Celinda Balo, MD  Active   benralizumab  (FASENRA ) 30 MG/ML prefilled syringe [id number] Yes Inject 1 mL (30 mg total) into the skin every 8 (eight) weeks. Jeneal Danita Macintosh, MD  Active Family Member, Pharmacy Records  Benralizumab  SOSY 30 mg [id number]   Jeneal Danita Macintosh, MD  Active   cetirizine  [facility] ALLERGY RELIEF, CETIRIZINE ,) 10 MG tablet [id number] Yes Take 1 tablet (10 mg total) by mouth daily. Jeneal Danita Macintosh, MD  Active Family Member, Pharmacy Records  diclofenac  Sodium (VOLTAREN ) 1 % GEL [id number] Yes Apply 4 g topically 4 (four) times daily.  Patient taking differently: Apply 4 g topically 4 (four) times daily as needed (pain).   Corey, Evan S, MD  Active Family Member, Pharmacy Records  EPINEPHrine  (EPIPEN  2-PAK) 0.3 mg/0.3 mL IJ SOAJ injection [id number] Yes Use as directed for severe allergic reaction Jeneal Danita Macintosh, MD  Active Family Member, Pharmacy Records  ipratropium (ATROVENT ) 0.06 % nasal spray [id number] Yes Place 2 sprays into both nostrils 3 (three) times daily as needed for rhinitis. Jeneal Danita Macintosh, MD  Active Family Member, Pharmacy Records  levothyroxine  (SYNTHROID ) 50 MCG tablet [id number] Yes TAKE 1 TABLET BY MOUTH ONCE DAILY BEFORE BREAKFAST Norleen Lynwood ORN, MD  Active Family Member, Pharmacy Records  Magnesium  Hydroxide (PHILLIPS MILK OF MAGNESIA PO) [id number] Yes Take 15-30 mLs by mouth daily as needed (for constipation). [provider]  Active Family Member, Pharmacy Records  memantine  (NAMENDA ) 10 MG tablet [id number] Yes Take 1 tablet (10 mg total) by mouth 2 (two) times daily. Whitfield Raisin, NP  Active Family Member, Pharmacy Records  montelukast  (SINGULAIR ) 10 MG tablet [id number] Yes Take 1 tablet (10 mg total) by mouth at bedtime. Jeneal Danita  Avelina, MD  Active Family Member, Pharmacy Records  ondansetron  (ZOFRAN -ODT) 4 MG disintegrating tablet [id number] Yes Take 1  tablet (4 mg total) by mouth every 8 (eight) hours as needed for nausea or vomiting. Merlynn Niki FALCON, FNP  Active Family Member, Pharmacy Records  pantoprazole  (PROTONIX ) 40 MG tablet [id number] Yes Take 1 tablet by mouth once daily Norleen Lynwood ORN, MD  Active   pravastatin  (PRAVACHOL ) 80 MG tablet [id number] Yes Take 1 tablet by mouth once daily Norleen Lynwood ORN, MD  Active   SYMBICORT  80-4.5 MCG/ACT inhaler [id number] Yes INHALE 2 PUFFS BY MOUTH IN THE MORNING Padgett, Danita Avelina, MD  Active Family Member, Pharmacy Records  Med List Note Allegra Deatrice LILLETTE Bishop 10/19/21 1516): Daughter Larry helps with medications.            Home Care and Equipment/Supplies: Were Home Health Services Ordered?: Yes Name of Home Health Agency:: [facility] Has Agency set up a time to come to your home?: No Any new equipment or medical supplies ordered?: NA  Functional Questionnaire: Do you need assistance with bathing/showering or dressing?: Yes Do you need assistance with meal preparation?: Yes Do you need assistance with eating?: No Do you have difficulty maintaining continence: No Do you need assistance with getting out of bed/getting out of a chair/moving?: No Do you have difficulty managing or taking your medications?: Yes  Follow up appointments reviewed: PCP Follow-up appointment confirmed?: Yes Date of PCP follow-up appointment?: 05/12/24 Follow-up Provider: [name] Follow-up appointment confirmed?: Yes Date of Specialist follow-up appointment?: 05/12/24 Follow-Up Specialty Provider:: surgery Do you need transportation to your follow-up appointment?: No Do you understand care options if your condition(s) worsen?: Yes-patient verbalized understanding    SIGNATURE Julian Lemmings, LPN [facility] Nurse Health Advisor Direct Dial [phone]  "

## 2024-05-12 ENCOUNTER — Encounter: Payer: Self-pay | Admitting: Internal Medicine

## 2024-05-12 ENCOUNTER — Ambulatory Visit: Admitting: Internal Medicine

## 2024-05-12 VITALS — BP 122/78 | HR 76 | Temp 98.3°F | Ht 65.0 in | Wt 122.0 lb

## 2024-05-12 DIAGNOSIS — J918 Pleural effusion in other conditions classified elsewhere: Secondary | ICD-10-CM | POA: Diagnosis not present

## 2024-05-12 DIAGNOSIS — J189 Pneumonia, unspecified organism: Secondary | ICD-10-CM

## 2024-05-12 DIAGNOSIS — I82621 Acute embolism and thrombosis of deep veins of right upper extremity: Secondary | ICD-10-CM | POA: Diagnosis not present

## 2024-05-12 DIAGNOSIS — I10 Essential (primary) hypertension: Secondary | ICD-10-CM | POA: Diagnosis not present

## 2024-05-12 DIAGNOSIS — E7849 Other hyperlipidemia: Secondary | ICD-10-CM

## 2024-05-12 MED ORDER — PANTOPRAZOLE SODIUM 40 MG PO TBEC: 40.0000 mg | DELAYED_RELEASE_TABLET | Freq: Every day | ORAL | 3 refills | Status: AC

## 2024-05-12 MED ORDER — ALBUTEROL SULFATE HFA 108 (90 BASE) MCG/ACT IN AERS: 2.0000 | INHALATION_SPRAY | RESPIRATORY_TRACT | 1 refills | Status: AC | PRN

## 2024-05-12 MED ORDER — MEMANTINE HCL 10 MG PO TABS: 10.0000 mg | ORAL_TABLET | Freq: Two times a day (BID) | ORAL | 3 refills | Status: AC

## 2024-05-12 MED ORDER — LEVOTHYROXINE SODIUM 50 MCG PO TABS: 50.0000 ug | ORAL_TABLET | Freq: Every day | ORAL | 3 refills | Status: AC

## 2024-05-12 MED ORDER — AMLODIPINE BESYLATE 2.5 MG PO TABS: 2.5000 mg | ORAL_TABLET | Freq: Every day | ORAL | 3 refills | Status: AC

## 2024-05-12 MED ORDER — FLUTICASONE PROPIONATE 50 MCG/ACT NA SUSP: 1.0000 | Freq: Every day | NASAL | 5 refills | Status: AC

## 2024-05-12 MED ORDER — PRAVASTATIN SODIUM 80 MG PO TABS: 80.0000 mg | ORAL_TABLET | Freq: Every day | ORAL | 3 refills | Status: AC

## 2024-05-12 MED ORDER — APIXABAN 5 MG PO TABS: 5.0000 mg | ORAL_TABLET | Freq: Two times a day (BID) | ORAL | 4 refills | Status: AC

## 2024-05-12 MED ORDER — CETIRIZINE HCL 10 MG PO TABS: 10.0000 mg | ORAL_TABLET | Freq: Every day | ORAL | 3 refills | Status: AC

## 2024-05-12 MED ORDER — SYMBICORT 80-4.5 MCG/ACT IN AERO: INHALATION_SPRAY | RESPIRATORY_TRACT | 11 refills | Status: AC

## 2024-05-12 MED ORDER — MONTELUKAST SODIUM 10 MG PO TABS: 10.0000 mg | ORAL_TABLET | Freq: Every day | ORAL | 3 refills | Status: AC

## 2024-05-12 NOTE — Progress Notes (Signed)
 Patient ID: Angelica [name], female   DOB: [birth year], 83 y.o.   MRN: [id number]        Chief Complaint: follow up post hospn nov 9 - Apr 23 2024       HPI:  Angelica [name] is a 83 y.o. female here with above after left pleural effusion with negative cytology, tx for infection.  Has oncology f/u in 1 wk.  NG placed with adhesiolysis and small bowel resection nov 14.  Started on TPN.  Course complicated by RUE DVT started on lovenox  now tolerating eliquis  without overt bleeding.  Pt denies chest pain, increased sob or doe, wheezing, orthopnea, PND, increased LE swelling, palpitations, dizziness or syncope.   Pt denies polydipsia, polyuria, or new focal neuro s/s.    Has not been taking the pravachol  80 mg but plans to start.  BP has been mild elevated at home multiple times in the past week.  Denies worsening reflux, abd pain, dysphagia, n/v, bowel change or blood.         Wt Readings from Last 3 Encounters:  05/12/24 122 lb (55.3 kg)  04/20/24 159 lb 1.6 oz (72.2 kg)  01/07/24 130 lb (59 kg)   BP Readings from Last 3 Encounters:  05/12/24 122/78  04/23/24 (!) 143/78  01/07/24 (!) 137/58         Past Medical History:  Diagnosis Date   Allergy 05/21/1978   Arthritis    Asthma    Bowel obstruction (HCC)    Cancer (HCC)    Cataract    Environmental allergies    GERD (gastroesophageal reflux disease)    Glaucoma 05/09/2017   H. pylori infection    History of radiation therapy 07/05/20-08/05/20   Left lung IMRT Dr. Shannon    HLD (hyperlipidemia) 01/16/2019   Hypertension    Iron  deficiency anemia    Osteopenia 05/26/2017   Thyroid  disease    Urticaria 07/25/2018   Past Surgical History:  Procedure Laterality Date   ABDOMINAL HYSTERECTOMY     BOWEL RESECTION N/A 05/23/2020   Procedure: SMALL BOWEL REPAIR;  Surgeon: Stevie, Herlene Righter, MD;  Location: [facility] OR;  Service: General;  Laterality: N/A;   BREAST BIOPSY     BRONCHIAL BIOPSY  06/10/2020   Procedure: BRONCHIAL BIOPSIES;   Surgeon: Brenna Adine CROME, DO;  Location: [facility];  Service: Pulmonary;;   BRONCHIAL BRUSHINGS  06/10/2020   Procedure: BRONCHIAL BRUSHINGS;  Surgeon: Brenna Adine CROME, DO;  Location: [facility];  Service: Pulmonary;;   BRONCHIAL NEEDLE ASPIRATION BIOPSY  06/10/2020   Procedure: BRONCHIAL NEEDLE ASPIRATION BIOPSIES;  Surgeon: Brenna Adine CROME, DO;  Location: [facility];  Service: Pulmonary;;   BRONCHIAL WASHINGS  06/10/2020   Procedure: BRONCHIAL WASHINGS;  Surgeon: Brenna Adine CROME, DO;  Location: [facility];  Service: Pulmonary;;   CHEST TUBE INSERTION Left 04/01/2024   Procedure: CHEST TUBE INSERTION;  Surgeon: Catherine Cools, MD;  Location: [facility];  Service: Pulmonary;  Laterality: Left;   COLON SURGERY     DIAGNOSTIC LARYNGOSCOPY N/A 05/23/2020   Procedure: ATTEMPTED DIAGNOSTIC LAPAROSCOPY WITH ADHESIONS;  Surgeon: Kinsinger, Herlene Righter, MD;  Location: [facility] OR;  Service: General;  Laterality: N/A;   EYE SURGERY     IR IMAGING GUIDED PORT INSERTION  07/15/2020   IR REMOVAL TUN ACCESS W/ PORT W/O FL MOD SED  01/16/2023   KNEE SURGERY     LAPAROTOMY N/A 04/18/2015   Procedure: Exploratory laparotomy with lysis of adhesions, possible bowel resection;  Surgeon: Lynda  Rubin, MD;  Location: [facility];  Service: General;  Laterality: N/A;   LAPAROTOMY N/A 05/23/2020   Procedure: EXPLORATORY LAPAROTOMY;  Surgeon: Stevie Herlene Righter, MD;  Location: [facility];  Service: General;  Laterality: N/A;   LAPAROTOMY N/A 04/03/2024   Procedure: EXPLORATORY LAPAROTOMY, LYSIS OF ADHESIONS, SMALL BOWEL RESECTION, SMALL BOWEL REPAIR;  Surgeon: Teresa Lonni HERO, MD;  Location: [facility];  Service: General;  Laterality: N/A;   LYSIS OF ADHESION N/A 05/23/2020   Procedure: LYSIS OF ADHESION;  Surgeon: Stevie Herlene Righter, MD;  [facility];  Service: General;  Laterality: N/A;   NASAL SINUS SURGERY     SMALL INTESTINE SURGERY     THORACENTESIS Left 12/03/2022   Procedure: THORACENTESIS;   Surgeon: Brenna Adine CROME, DO;  [facility];  Service: Pulmonary;  Laterality: Left;   THYROID  SURGERY     VIDEO BRONCHOSCOPY WITH ENDOBRONCHIAL NAVIGATION Bilateral 06/10/2020   Procedure: VIDEO BRONCHOSCOPY WITH ENDOBRONCHIAL NAVIGATION;  Surgeon: Brenna Adine CROME, DO;  [facility]: [facility];  Service: Pulmonary;  Laterality: Bilateral;   VIDEO BRONCHOSCOPY WITH ENDOBRONCHIAL ULTRASOUND  06/10/2020   Procedure: VIDEO BRONCHOSCOPY WITH ENDOBRONCHIAL ULTRASOUND;  Surgeon: Brenna Adine CROME, DO;  [facility]: [facility];  Service: Pulmonary;;    reports that she quit smoking about 54 years ago. Her smoking use included cigarettes. She has been exposed to tobacco smoke. She has never used smokeless tobacco. She reports that she does not drink alcohol and does not use drugs. family history includes Diabetes in her mother; Glaucoma in her brother and sister; Hypertension in her father and mother. Allergies[1] Medications Ordered Prior to Encounter[2]      ROS:  All others reviewed and negative.  Objective        PE:  BP 122/78 (BP Location: Right Arm, Patient Position: Sitting, Cuff Size: Normal)   Pulse 76   Temp 98.3 F (36.8 C) (Oral)   Ht 5' 5 (1.651 m)   Wt 122 lb (55.3 kg)   SpO2 99%   BMI 20.30 kg/m                 Constitutional: Pt appears in NAD               HENT: Head: NCAT.                Right Ear: External ear normal.                 Left Ear: External ear normal.                Eyes: . Pupils are equal, round, and reactive to light. Conjunctivae and EOM are normal               Nose: without d/c or deformity               Neck: Neck supple. Gross normal ROM               Cardiovascular: Normal rate and regular rhythm.                 Pulmonary/Chest: Effort normal and breath sounds without rales or wheezing.                Abd:  Soft, NT, ND, + BS, no organomegaly               Neurological: Pt is alert. At baseline orientation, motor grossly intact  Skin: Skin is warm. No rashes, no other new lesions, LE edema - none               Psychiatric: Pt behavior is normal without agitation   Micro: none  Cardiac tracings I have personally interpreted today:  none  Pertinent Radiological findings (summarize): none   Lab Results  Component Value Date   WBC 8.6 04/20/2024   HGB 7.7 (L) 04/20/2024   HCT 24.1 (L) 04/20/2024   PLT 302 04/20/2024   GLUCOSE 78 04/19/2024   CHOL 191 07/18/2021   TRIG 47 04/13/2024   HDL 77.50 07/18/2021   LDLCALC 97 07/18/2021   ALT 7 04/16/2024   AST 17 04/16/2024   NA 134 (L) 04/19/2024   K 3.9 04/19/2024   CL 100 04/19/2024   CREATININE 0.58 04/19/2024   BUN 13 04/19/2024   CO2 27 04/19/2024   TSH 4.090 03/29/2024   INR 1.2 03/29/2024   HGBA1C 5.9 (H) 04/07/2024   Assessment/Plan:  Angelica [name] is a 83 y.o. Black or African American [2] female with  has a past medical history of Allergy (05/21/1978), Arthritis, Asthma, Bowel obstruction (HCC), Cancer (HCC), Cataract, Environmental allergies, GERD (gastroesophageal reflux disease), Glaucoma (05/09/2017), H. pylori infection, History of radiation therapy (07/05/20-08/05/20), HLD (hyperlipidemia) (01/16/2019), Hypertension, Iron  deficiency anemia, Osteopenia (05/26/2017), Thyroid  disease, and Urticaria (07/25/2018).  Pleural effusion associated with pulmonary infection vs. recurrent malignancy and sepsis Clinically resolved, stable, for f/u next with oncology  HTN (hypertension) Midl to mod, for amlodipine  2.5 mg daily  HLD (hyperlipidemia) Lab Results  Component Value Date   LDLCALC 97 07/18/2021   Stable but last LDL > 1 yr,, pt to restart pravachol  80 mg every day, declines f/u lab today    Arm DVT (deep venous thromboembolism), acute, right (HCC) Pt tolerating eliquis  well, but will need to address length of tx, and family states will d/w oncology  Followup: Return in about 6 months (around 11/10/2024).  Lynwood Rush, MD  05/15/2024 4:03 PM [facility]     [1]  Allergies Allergen Reactions   Asa Buff (Mag [Buffered Aspirin] Other (See Comments)    Excessive sweating   Ensure [Nutritional Supplements]     Or boost - upset stomach    Fish Allergy Other (See Comments)    Unknown reaction, but allergic. Has tolerated Intralipids in TpN 2022/2025   Other Other (See Comments)    Gelcaps; gel-containing capsules; extended-release medication - upset stomach    Peanut-Containing Drug Products Other (See Comments)    From allergy test, Has tolerated Intralipids in TpN 2022/2025    Penicillins Itching   Shellfish Protein-Containing Drug Products Other (See Comments)    From allergy test, Has tolerated Intralipids in TpN 2022/2025    Strawberry Extract Nausea And Vomiting and Other (See Comments)    From allergy test   [2]  Current Outpatient Medications on File Prior to Visit  Medication Sig Dispense Refill   oxyCODONE  (OXY IR/ROXICODONE ) 5 MG immediate release tablet Take 5 mg by mouth 2 (two) times daily as needed.     acetaminophen  (TYLENOL ) 500 MG tablet Take 1,000 mg by mouth every 6 (six) hours as needed for moderate pain.     alum & mag hydroxide-simeth (MAALOX/MYLANTA) 200-200-20 MG/5ML suspension Take 15 mLs by mouth every 6 (six) hours as needed for indigestion or heartburn.     benralizumab  (FASENRA ) 30 MG/ML prefilled syringe Inject 1 mL (30 mg  total) into the skin every 8 (eight) weeks. 1 mL 6   diclofenac  Sodium (VOLTAREN ) 1 % GEL Apply 4 g topically 4 (four) times daily. (Patient taking differently: Apply 4 g topically 4 (four) times daily as needed (pain).) 100 g 0   EPINEPHrine  (EPIPEN  2-PAK) 0.3 mg/0.3 mL IJ SOAJ injection Use as directed for severe allergic reaction 2 each 1   ipratropium (ATROVENT ) 0.06 % nasal spray Place 2 sprays into both nostrils 3 (three) times daily as needed for rhinitis. 15 mL 11   Magnesium   Hydroxide (PHILLIPS MILK OF MAGNESIA PO) Take 15-30 mLs by mouth daily as needed (for constipation).     ondansetron  (ZOFRAN -ODT) 4 MG disintegrating tablet Take 1 tablet (4 mg total) by mouth every 8 (eight) hours as needed for nausea or vomiting. 10 tablet 0   Current Facility-Administered Medications on File Prior to Visit  Medication Dose Route Frequency Provider Last Rate Last Admin   Benralizumab  SOSY 30 mg  30 mg Subcutaneous Q8 Weeks Padgett, Shaylar Patricia, MD   30 mg at 05/06/24 0830

## 2024-05-12 NOTE — Patient Instructions (Signed)
 Please take all new medication as prescribed - the amlodipine  2.5 mg per day for BP  Please continue all other medications as before, and refills have been done   Please have the pharmacy call with any other refills you may need.  Please continue your efforts at being more active, low cholesterol diet, and weight control.  Please keep your appointments with your specialists as you may have planned - oncology next week  We can hold on lab testing today  Please make an Appointment to return in 6 months, or sooner if needed

## 2024-05-15 ENCOUNTER — Encounter: Payer: Self-pay | Admitting: Internal Medicine

## 2024-05-15 DIAGNOSIS — I82621 Acute embolism and thrombosis of deep veins of right upper extremity: Secondary | ICD-10-CM | POA: Insufficient documentation

## 2024-05-15 NOTE — Assessment & Plan Note (Signed)
 Midl to mod, for amlodipine  2.5 mg daily

## 2024-05-15 NOTE — Assessment & Plan Note (Signed)
 Clinically resolved, stable, for f/u next with oncology

## 2024-05-15 NOTE — Assessment & Plan Note (Signed)
 Pt tolerating eliquis  well, but will need to address length of tx, and family states will d/w oncology

## 2024-05-15 NOTE — Assessment & Plan Note (Signed)
 Lab Results  Component Value Date   LDLCALC 97 07/18/2021   Stable but last LDL > 1 yr,, pt to restart pravachol  80 mg every day, declines f/u lab today

## 2024-05-18 NOTE — Progress Notes (Unsigned)
 "  New Patient Pulmonology Office Visit   Subjective:  Patient ID: Angelica [name], female    DOB: January 12, 1941  MRN: [id number]  Referred by: Norleen Lynwood ORN, MD  CC: No chief complaint on file.   HPI Angelica [name] is a 83 y.o. 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 Cytology was neg for malignancy.  Patient came this time for an asthma follow up. She uses albuterol , symbicort , and Fasenra .  Discussed the use of AI scribe software for clinical note transcription with the patient, who gave verbal consent to proceed.  History of Present Illness Patient denied any symptoms - no wheezing, no dyspnea, no sob, no cough Denied any recent asthma flares She is well controlled on Symbicort , albuterol , and Singulair , and receives Fasenra  injections every eight weeks.  After the last admission, she feels back to her baseline and denied any symptoms She is on eliquis  due to DVT 2/2 picc line Her lung cancer remains under observation with no active treatment.   ROS as above  Allergies: Asa buff (mag [buffered aspirin], Ensure [nutritional supplements], Fish allergy, Other, Peanut-containing drug products, Penicillins, Shellfish protein-containing drug products, and Strawberry extract Current Medications[1] Past Medical History:  Diagnosis Date   Allergy 05/21/1978   Arthritis    Asthma    Bowel obstruction (HCC)    Cancer (HCC)    Cataract    Environmental allergies    GERD (gastroesophageal reflux disease)    Glaucoma 05/09/2017   H. pylori infection    History of radiation therapy 07/05/20-08/05/20   Left lung IMRT Dr. Shannon     HLD (hyperlipidemia) 01/16/2019   Hypertension    Iron  deficiency anemia    Osteopenia 05/26/2017   Thyroid  disease    Urticaria 07/25/2018   Past Surgical History:  Procedure Laterality Date   ABDOMINAL HYSTERECTOMY     BOWEL RESECTION N/A 05/23/2020   Procedure: SMALL BOWEL REPAIR;  Surgeon: Stevie, Herlene Righter, MD;  Location: [facility];  Service: General;  Laterality: N/A;   BREAST BIOPSY     BRONCHIAL BIOPSY  06/10/2020   Procedure: BRONCHIAL BIOPSIES;  Surgeon: Brenna Adine CROME, DO;  Location: [facility];  Service: Pulmonary;;   BRONCHIAL BRUSHINGS  06/10/2020   Procedure: BRONCHIAL BRUSHINGS;  Surgeon: Brenna Adine CROME, DO;  Location: [facility];  Service: Pulmonary;;   BRONCHIAL NEEDLE ASPIRATION BIOPSY  06/10/2020   Procedure: BRONCHIAL NEEDLE ASPIRATION BIOPSIES;  Surgeon: Brenna Adine CROME, DO;  Location: [facility];  Service: Pulmonary;;   BRONCHIAL WASHINGS  06/10/2020   Procedure: BRONCHIAL WASHINGS;  Surgeon: Brenna Adine CROME, DO;  Location: [facility];  Service: Pulmonary;;   CHEST TUBE INSERTION Left 04/01/2024   Procedure: CHEST TUBE INSERTION;  Surgeon: Catherine Cools, MD;  Location: [facility];  Service: Pulmonary;  Laterality: Left;   COLON SURGERY     DIAGNOSTIC LARYNGOSCOPY N/A 05/23/2020   Procedure: ATTEMPTED DIAGNOSTIC LAPAROSCOPY WITH ADHESIONS;  Surgeon: Stevie, Herlene Righter, MD;  Location: [facility];  Service: General;  Laterality: N/A;   EYE SURGERY     IR IMAGING GUIDED PORT INSERTION  07/15/2020   IR REMOVAL TUN ACCESS W/ PORT W/O FL MOD SED  01/16/2023   KNEE SURGERY  LAPAROTOMY N/A 04/18/2015   Procedure: Exploratory laparotomy with lysis of adhesions, possible bowel resection;  Surgeon: Lynda Leos, MD;  Location: [facility];  Service: General;  Laterality: N/A;   LAPAROTOMY N/A 05/23/2020   Procedure: EXPLORATORY LAPAROTOMY;  Surgeon: Stevie Herlene Righter, MD;  Location: [facility];  Service: General;  Laterality: N/A;   LAPAROTOMY N/A  04/03/2024   Procedure: EXPLORATORY LAPAROTOMY, LYSIS OF ADHESIONS, SMALL BOWEL RESECTION, SMALL BOWEL REPAIR;  Surgeon: Teresa Lonni HERO, MD;  Location: [facility];  Service: General;  Laterality: N/A;   LYSIS OF ADHESION N/A 05/23/2020   Procedure: LYSIS OF ADHESION;  Surgeon: Stevie Herlene Righter, MD;  Location: [facility];  Service: General;  Laterality: N/A;   NASAL SINUS SURGERY     SMALL INTESTINE SURGERY     THORACENTESIS Left 12/03/2022   Procedure: THORACENTESIS;  Surgeon: Brenna Adine CROME, DO;  Location: [facility];  Service: Pulmonary;  Laterality: Left;   THYROID  SURGERY     VIDEO BRONCHOSCOPY WITH ENDOBRONCHIAL NAVIGATION Bilateral 06/10/2020   Procedure: VIDEO BRONCHOSCOPY WITH ENDOBRONCHIAL NAVIGATION;  Surgeon: Brenna Adine CROME, DO;  [facility]: [facility];  Service: Pulmonary;  Laterality: Bilateral;   VIDEO BRONCHOSCOPY WITH ENDOBRONCHIAL ULTRASOUND  06/10/2020   Procedure: VIDEO BRONCHOSCOPY WITH ENDOBRONCHIAL ULTRASOUND;  Surgeon: Brenna Adine CROME, DO;  Location: [facility];  Service: Pulmonary;;   Family History  Problem Relation Age of Onset   Diabetes Mother    Hypertension Mother    Hypertension Father    Glaucoma Sister    Glaucoma Brother    Allergic rhinitis Neg Hx    Angioedema Neg Hx    Asthma Neg Hx    Eczema Neg Hx    Immunodeficiency Neg Hx    Urticaria Neg Hx    BRCA 1/2 Neg Hx    Breast cancer Neg Hx    Social History   Socioeconomic History   Marital status: Widowed    Spouse name: Not on file   Number of children: 4   Years of education: 54   Highest education level: 10th grade  Occupational History   Occupation: retired  Tobacco Use   Smoking status: Former    Current packs/day: 0.00    Types: Cigarettes    Quit date: 05/21/1970    Years since quitting: 54.0    Passive exposure: Past   Smokeless tobacco: Never   Tobacco comments:    quit 60 years ago  Vaping Use   Vaping status: Never Used  Substance and Sexual Activity    Alcohol use: No   Drug use: No   Sexual activity: Never  Other Topics Concern   Not on file  Social History Narrative   Right handed   Caffeine on occ   One story home   Social Drivers of Health   Tobacco Use: Medium Risk (05/15/2024)   Patient History    Smoking Tobacco Use: Former    Smokeless Tobacco Use: Never    Passive Exposure: Past  Physicist, Medical Strain: Low Risk (12/15/2023)   Overall Financial Resource Strain (CARDIA)    Difficulty of Paying Living Expenses: Not hard at all  Food Insecurity: No Food Insecurity (03/29/2024)   Epic    Worried About Programme Researcher, Broadcasting/film/video in the Last Year: Never true    Ran Out of Food in the Last Year: Never true  Transportation Needs: No Transportation Needs (03/29/2024)   Epic    Lack of Transportation (Medical): No    Lack of Transportation (Non-Medical): No  Physical Activity: Inactive (12/15/2023)   Exercise Vital Sign    Days of Exercise per Week: 0 days    Minutes of Exercise per Session: Not on file  Stress: No Stress Concern Present (12/15/2023)   [employer] of Occupational Health - Occupational Stress Questionnaire    Feeling of Stress: Not at all  Social Connections: Moderately Integrated (12/15/2023)   Social Connection and Isolation Panel    Frequency of Communication with Friends and Family: More than three times a week    Frequency of Social Gatherings with Friends and Family: More than three times a week    Attends Religious Services: More than 4 times per year    Active Member of [employer] or Organizations: Yes    Attends Banker Meetings: More than 4 times per year    Marital Status: Widowed  Intimate Partner Violence: Not At Risk (03/29/2024)   Epic    Fear of Current or Ex-Partner: No    Emotionally Abused: No    Physically Abused: No    Sexually Abused: No  Depression (PHQ2-9): Low Risk (05/12/2024)   Depression (PHQ2-9)    PHQ-2 Score: 0  Alcohol Screen: Low Risk (08/20/2023)   Alcohol Screen     Last Alcohol Screening Score (AUDIT): 0  Housing: Low Risk (03/29/2024)   Epic    Unable to Pay for Housing in the Last Year: No    Number of Times Moved in the Last Year: 0    Homeless in the Last Year: No  Utilities: Not At Risk (03/29/2024)   Epic    Threatened with loss of utilities: No  Health Literacy: Adequate Health Literacy (08/20/2023)   B1300 Health Literacy    Frequency of need for help with medical instructions: Never       Objective:  There were no vitals taken for this visit. Wt Readings from Last 3 Encounters:  05/19/24 122 lb 3.2 oz (55.4 kg)  05/12/24 122 lb (55.3 kg)  04/20/24 159 lb 1.6 oz (72.2 kg)   BMI Readings from Last 3 Encounters:  05/19/24 22.35 kg/m  05/12/24 20.30 kg/m  04/20/24 26.48 kg/m   SpO2 Readings from Last 3 Encounters:  05/19/24 95%  05/12/24 99%  04/23/24 100%    Physical Exam Constitutional:      Appearance: Normal appearance.  HENT:     Head: Normocephalic.  Eyes:     Extraocular Movements: Extraocular movements intact.     Pupils: Pupils are equal, round, and reactive to light.  Cardiovascular:     Rate and Rhythm: Normal rate and regular rhythm.  Pulmonary:     Effort: Pulmonary effort is normal.     Breath sounds: Normal breath sounds.     Comments: No wheezing, no crackles Musculoskeletal:     Comments: Move 4 extremities  Skin:    General: Skin is warm.     Comments: No LE edema  Neurological:     General: No focal deficit present.     Mental Status: She is alert. Mental status is at baseline.     Diagnostic Review:  04/01/24 chest tube: Cytology negative for malignancy at that time. Neutrophilic effusion. LDH 454. Protein 4.5.    Identification performed by account, not confirmed by this laboratory. Achromobacter species - abdominal abscess  Antimicrobial Suscept Comment  Comment: (NOTE)      ** S = Susceptible; I = Intermediate; R = Resistant **  P = Positive; N = Negative             MICS are expressed in micrograms per mL   Antibiotic                 RSLT#1    RSLT#2    RSLT#3    RSLT#4 Amikacin                       R Aztreonam                      R Cefepime                        I Cefotaxime                     I Ceftazidime                    R Ceftriaxone                     I Ciprofloxacin                  S Gentamicin                      R Imipenem                       S Levofloxacin                    S Meropenem                       S Minocycline                    S Piperacillin/Tazobactam        R Tobramycin                     R Trimethoprim /Sulfa              S   CT chest 04/10/24 1. Interval partial small bowel resection with resolution of the previously noted small bowel obstruction. Oral contrast now extends into the distal transverse colon. 2. Loculated, rim enhancing intraperitoneal fluid collection adjacent to the anastomosis measuring 3.1 x 3.7 x 8.3 cm (approximate volume 49 mL), concerning for a postoperative abscess. 3. Second similar loculated, rim enhancing peritoneal fluid collection in the left lower quadrant measuring 1.8 x 3.8 x 10.5 cm (approximate volume 37 mL). 4. Small dependent right pleural effusion, increased since prior examination. 5. Residual posterior loculated left pleural effusion measuring 2.1 x 6.1 x 5.0 cm (approximate volume 33 mL), with small adjacent free flowing pleural effusion.   Assessment & Plan:   Assessment & Plan Asthma, severe persistent, well-controlled (HCC) Denied flares and respiratory symptoms. Well controlled on symbicort , singulair  and Fasenra . Albuterol  as need it  Malignant neoplasm of unspecified part of unspecified bronchus or lung (HCC) Stage IIIB adenoca left lung s/p chemo/radiation Under surveillance by oncologist. No active treatements  Pleural effusion At the last admission patient was found to have small bowel obstruction s/p laparotomy with multiple abscesses on 04/03/24 -  Achromobacter species, resistant to multiple antibiotics. Then she developed an exudative pleural effusion s/p chest tube likely in the setting of the abdominal process. Plan to repeat CT scan in 6 weeks. Orders:   CT Chest  Wo Contrast; Future      Return in about 4 months (around 09/17/2024).    Marny Patch, MD Pulmonary and Critical Care Medicine [facility]     [1]  Current Outpatient Medications:    acetaminophen  (TYLENOL ) 500 MG tablet, Take 1,000 mg by mouth every 6 (six) hours as needed for moderate pain., Disp: , Rfl:    albuterol  (VENTOLIN  HFA) 108 (90 Base) MCG/ACT inhaler, Inhale 2 puffs into the lungs every 4 (four) hours as needed for wheezing or shortness of breath., Disp: 18 g, Rfl: 1   alum & mag hydroxide-simeth (MAALOX/MYLANTA) 200-200-20 MG/5ML suspension, Take 15 mLs by mouth every 6 (six) hours as needed for indigestion or heartburn., Disp: , Rfl:    amLODipine  (NORVASC ) 2.5 MG tablet, Take 1 tablet (2.5 mg total) by mouth daily., Disp: 90 tablet, Rfl: 3   apixaban  (ELIQUIS ) 5 MG TABS tablet, Take 1 tablet (5 mg total) by mouth 2 (two) times daily., Disp: 60 tablet, Rfl: 4   benralizumab  (FASENRA ) 30 MG/ML prefilled syringe, Inject 1 mL (30 mg total) into the skin every 8 (eight) weeks., Disp: 1 mL, Rfl: 6   cetirizine  (EQ ALLERGY RELIEF, CETIRIZINE ,) 10 MG tablet, Take 1 tablet (10 mg total) by mouth daily., Disp: 90 tablet, Rfl: 3   diclofenac  Sodium (VOLTAREN ) 1 % GEL, Apply 4 g topically 4 (four) times daily. (Patient taking differently: Apply 4 g topically 4 (four) times daily as needed (pain).), Disp: 100 g, Rfl: 0   EPINEPHrine  (EPIPEN  2-PAK) 0.3 mg/0.3 mL IJ SOAJ injection, Use as directed for severe allergic reaction, Disp: 2 each, Rfl: 1   fluticasone  (FLONASE ) 50 MCG/ACT nasal spray, Place 1 spray into both nostrils daily., Disp: 9.9 mL, Rfl: 5   ipratropium (ATROVENT ) 0.06 % nasal spray, Place 2 sprays into both nostrils 3 (three)  times daily as needed for rhinitis., Disp: 15 mL, Rfl: 11   levothyroxine  (SYNTHROID ) 50 MCG tablet, Take 1 tablet (50 mcg total) by mouth daily before breakfast., Disp: 90 tablet, Rfl: 3   Magnesium  Hydroxide (PHILLIPS MILK OF MAGNESIA PO), Take 15-30 mLs by mouth daily as needed (for constipation)., Disp: , Rfl:    memantine  (NAMENDA ) 10 MG tablet, Take 1 tablet (10 mg total) by mouth 2 (two) times daily., Disp: 180 tablet, Rfl: 3   montelukast  (SINGULAIR ) 10 MG tablet, Take 1 tablet (10 mg total) by mouth at bedtime., Disp: 90 tablet, Rfl: 3   ondansetron  (ZOFRAN -ODT) 4 MG disintegrating tablet, Take 1 tablet (4 mg total) by mouth every 8 (eight) hours as needed for nausea or vomiting., Disp: 10 tablet, Rfl: 0   oxyCODONE  (OXY IR/ROXICODONE ) 5 MG immediate release tablet, Take 5 mg by mouth 2 (two) times daily as needed., Disp: , Rfl:    pantoprazole  (PROTONIX ) 40 MG tablet, Take 1 tablet (40 mg total) by mouth daily., Disp: 90 tablet, Rfl: 3   pravastatin  (PRAVACHOL ) 80 MG tablet, Take 1 tablet (80 mg total) by mouth daily., Disp: 90 tablet, Rfl: 3   SYMBICORT  80-4.5 MCG/ACT inhaler, INHALE 2 PUFFS BY MOUTH IN THE MORNING, Disp: 10.2 g, Rfl: 11  Current Facility-Administered Medications:    Benralizumab  SOSY 30 mg, 30 mg, Subcutaneous, Q8 Weeks, Jeneal Danita Macintosh, MD, 30 mg at 05/06/24 0830  "

## 2024-05-19 ENCOUNTER — Ambulatory Visit

## 2024-05-19 VITALS — BP 132/84 | HR 71 | Temp 97.6°F | Ht 62.0 in | Wt 122.2 lb

## 2024-05-19 DIAGNOSIS — J4550 Severe persistent asthma, uncomplicated: Secondary | ICD-10-CM

## 2024-05-19 DIAGNOSIS — Z87891 Personal history of nicotine dependence: Secondary | ICD-10-CM

## 2024-05-19 DIAGNOSIS — C3490 Malignant neoplasm of unspecified part of unspecified bronchus or lung: Secondary | ICD-10-CM

## 2024-05-19 DIAGNOSIS — J90 Pleural effusion, not elsewhere classified: Secondary | ICD-10-CM | POA: Diagnosis not present

## 2024-05-19 NOTE — Assessment & Plan Note (Addendum)
 At the last admission patient was found to have small bowel obstruction s/p laparotomy with multiple abscesses on 04/03/24 - Achromobacter species, resistant to multiple antibiotics. Then she developed an exudative pleural effusion s/p chest tube likely in the setting of the abdominal process. Plan to repeat CT scan in 6 weeks. Orders:   CT Chest Wo Contrast; Future

## 2024-05-19 NOTE — Assessment & Plan Note (Addendum)
 Denied flares and respiratory symptoms. Well controlled on symbicort , singulair  and Fasenra . Albuterol  as need it

## 2024-05-19 NOTE — Patient Instructions (Signed)
 Dear [name],   I am glad to hear your asthma is well controlled. Let me know if you need refills of the inhalers 2 weeks prior your last dose.  I will repeat a CT scan to follow up the fluid in your lung in 6 weeks around 2nd week of January.   I will see you around 4 months.

## 2024-06-06 ENCOUNTER — Ambulatory Visit (HOSPITAL_BASED_OUTPATIENT_CLINIC_OR_DEPARTMENT_OTHER)

## 2024-06-06 ENCOUNTER — Other Ambulatory Visit: Payer: Self-pay | Admitting: Internal Medicine

## 2024-06-07 ENCOUNTER — Ambulatory Visit (HOSPITAL_BASED_OUTPATIENT_CLINIC_OR_DEPARTMENT_OTHER)
Admission: RE | Admit: 2024-06-07 | Discharge: 2024-06-07 | Disposition: A | Discharge status: Home / Self Care | Source: Ambulatory Visit

## 2024-06-07 DIAGNOSIS — J90 Pleural effusion, not elsewhere classified: Secondary | ICD-10-CM | POA: Diagnosis present

## 2024-06-09 ENCOUNTER — Ambulatory Visit: Admitting: Podiatry

## 2024-06-12 ENCOUNTER — Telehealth: Payer: Self-pay | Admitting: *Deleted

## 2024-06-12 NOTE — Telephone Encounter (Signed)
 L/m with daughter patient needs to go online call or come by to sign consent

## 2024-06-15 ENCOUNTER — Ambulatory Visit: Payer: Self-pay

## 2024-06-19 ENCOUNTER — Ambulatory Visit: Admitting: Internal Medicine

## 2024-06-29 ENCOUNTER — Ambulatory Visit

## 2024-06-30 ENCOUNTER — Other Ambulatory Visit

## 2024-06-30 ENCOUNTER — Ambulatory Visit (HOSPITAL_COMMUNITY)

## 2024-07-07 ENCOUNTER — Ambulatory Visit: Admitting: Internal Medicine

## 2024-07-29 ENCOUNTER — Ambulatory Visit: Admitting: Adult Health

## 2024-08-27 ENCOUNTER — Ambulatory Visit

## 2025-01-06 ENCOUNTER — Ambulatory Visit: Admitting: Allergy
